# Patient Record
Sex: Male | Born: 1940 | Race: White | Hispanic: No | Marital: Married | Smoking: Former smoker
Health system: Southern US, Community
[De-identification: ages and names within clinical notes are randomized; demographics above are authoritative.]

## PROBLEM LIST (undated history)

## (undated) DIAGNOSIS — F419 Anxiety disorder, unspecified: Secondary | ICD-10-CM

## (undated) DIAGNOSIS — H9190 Unspecified hearing loss, unspecified ear: Secondary | ICD-10-CM

## (undated) DIAGNOSIS — R413 Other amnesia: Secondary | ICD-10-CM

## (undated) DIAGNOSIS — R0600 Dyspnea, unspecified: Secondary | ICD-10-CM

## (undated) DIAGNOSIS — N183 Chronic kidney disease, stage 3 unspecified: Secondary | ICD-10-CM

## (undated) DIAGNOSIS — D509 Iron deficiency anemia, unspecified: Secondary | ICD-10-CM

## (undated) DIAGNOSIS — C801 Malignant (primary) neoplasm, unspecified: Secondary | ICD-10-CM

## (undated) DIAGNOSIS — N2581 Secondary hyperparathyroidism of renal origin: Secondary | ICD-10-CM

## (undated) DIAGNOSIS — I639 Cerebral infarction, unspecified: Secondary | ICD-10-CM

## (undated) DIAGNOSIS — I5189 Other ill-defined heart diseases: Secondary | ICD-10-CM

## (undated) DIAGNOSIS — N184 Chronic kidney disease, stage 4 (severe): Secondary | ICD-10-CM

## (undated) DIAGNOSIS — Z950 Presence of cardiac pacemaker: Secondary | ICD-10-CM

## (undated) DIAGNOSIS — E785 Hyperlipidemia, unspecified: Secondary | ICD-10-CM

## (undated) DIAGNOSIS — I499 Cardiac arrhythmia, unspecified: Secondary | ICD-10-CM

## (undated) DIAGNOSIS — I421 Obstructive hypertrophic cardiomyopathy: Secondary | ICD-10-CM

## (undated) DIAGNOSIS — R55 Syncope and collapse: Secondary | ICD-10-CM

## (undated) DIAGNOSIS — Z9989 Dependence on other enabling machines and devices: Secondary | ICD-10-CM

## (undated) DIAGNOSIS — L97519 Non-pressure chronic ulcer of other part of right foot with unspecified severity: Secondary | ICD-10-CM

## (undated) DIAGNOSIS — I739 Peripheral vascular disease, unspecified: Secondary | ICD-10-CM

## (undated) DIAGNOSIS — G4730 Sleep apnea, unspecified: Secondary | ICD-10-CM

## (undated) DIAGNOSIS — I2510 Atherosclerotic heart disease of native coronary artery without angina pectoris: Secondary | ICD-10-CM

## (undated) DIAGNOSIS — I495 Sick sinus syndrome: Secondary | ICD-10-CM

## (undated) DIAGNOSIS — M1990 Unspecified osteoarthritis, unspecified site: Secondary | ICD-10-CM

## (undated) DIAGNOSIS — Z85828 Personal history of other malignant neoplasm of skin: Secondary | ICD-10-CM

## (undated) DIAGNOSIS — E1159 Type 2 diabetes mellitus with other circulatory complications: Secondary | ICD-10-CM

## (undated) DIAGNOSIS — Z794 Long term (current) use of insulin: Secondary | ICD-10-CM

## (undated) DIAGNOSIS — K8000 Calculus of gallbladder with acute cholecystitis without obstruction: Secondary | ICD-10-CM

## (undated) DIAGNOSIS — I4891 Unspecified atrial fibrillation: Secondary | ICD-10-CM

## (undated) DIAGNOSIS — S2239XA Fracture of one rib, unspecified side, initial encounter for closed fracture: Secondary | ICD-10-CM

## (undated) DIAGNOSIS — I10 Essential (primary) hypertension: Secondary | ICD-10-CM

## (undated) DIAGNOSIS — Z9641 Presence of insulin pump (external) (internal): Secondary | ICD-10-CM

## (undated) DIAGNOSIS — Z872 Personal history of diseases of the skin and subcutaneous tissue: Secondary | ICD-10-CM

## (undated) DIAGNOSIS — J3089 Other allergic rhinitis: Secondary | ICD-10-CM

## (undated) DIAGNOSIS — G629 Polyneuropathy, unspecified: Secondary | ICD-10-CM

## (undated) DIAGNOSIS — F32A Depression, unspecified: Secondary | ICD-10-CM

## (undated) DIAGNOSIS — N400 Enlarged prostate without lower urinary tract symptoms: Secondary | ICD-10-CM

## (undated) DIAGNOSIS — E119 Type 2 diabetes mellitus without complications: Secondary | ICD-10-CM

## (undated) DIAGNOSIS — L821 Other seborrheic keratosis: Secondary | ICD-10-CM

## (undated) DIAGNOSIS — E1169 Type 2 diabetes mellitus with other specified complication: Secondary | ICD-10-CM

## (undated) DIAGNOSIS — J302 Other seasonal allergic rhinitis: Secondary | ICD-10-CM

## (undated) DIAGNOSIS — I509 Heart failure, unspecified: Secondary | ICD-10-CM

## (undated) DIAGNOSIS — G4733 Obstructive sleep apnea (adult) (pediatric): Secondary | ICD-10-CM

## (undated) HISTORY — DX: Obstructive hypertrophic cardiomyopathy: I42.1

## (undated) HISTORY — DX: Other allergic rhinitis: J30.89

## (undated) HISTORY — DX: Dyspnea, unspecified: R06.00

## (undated) HISTORY — PX: KNEE ARTHROSCOPY: SHX127

## (undated) HISTORY — DX: Other seasonal allergic rhinitis: J30.2

## (undated) HISTORY — PX: EYE SURGERY: SHX253

## (undated) HISTORY — DX: Essential (primary) hypertension: I10

## (undated) HISTORY — DX: Cardiac arrhythmia, unspecified: I49.9

## (undated) HISTORY — DX: Syncope and collapse: R55

## (undated) HISTORY — PX: CARDIAC CATHETERIZATION: SHX172

## (undated) HISTORY — DX: Chronic kidney disease, stage 4 (severe): N18.4

## (undated) HISTORY — DX: Atherosclerotic heart disease of native coronary artery without angina pectoris: I25.10

## (undated) HISTORY — DX: Type 2 diabetes mellitus with other specified complication: E11.69

## (undated) HISTORY — DX: Other ill-defined heart diseases: I51.89

## (undated) HISTORY — PX: TOTAL KNEE ARTHROPLASTY: SHX125

## (undated) HISTORY — DX: Hyperlipidemia, unspecified: E78.5

## (undated) HISTORY — DX: Sleep apnea, unspecified: G47.30

## (undated) HISTORY — DX: Peripheral vascular disease, unspecified: I73.9

## (undated) HISTORY — DX: Type 2 diabetes mellitus without complications: E11.9

## (undated) HISTORY — DX: Anxiety disorder, unspecified: F41.9

## (undated) HISTORY — DX: Type 2 diabetes mellitus with other circulatory complications: E11.59

## (undated) HISTORY — DX: Calculus of gallbladder with acute cholecystitis without obstruction: K80.00

## (undated) HISTORY — PX: VEIN LIGATION AND STRIPPING: SHX2653

## (undated) HISTORY — DX: Long term (current) use of insulin: Z79.4

## (undated) HISTORY — DX: Presence of cardiac pacemaker: Z95.0

## (undated) HISTORY — DX: Unspecified osteoarthritis, unspecified site: M19.90

## (undated) HISTORY — DX: Malignant (primary) neoplasm, unspecified: C80.1

## (undated) HISTORY — DX: Other seborrheic keratosis: L82.1

## (undated) HISTORY — DX: Cerebral infarction, unspecified: I63.9

## (undated) HISTORY — DX: Unspecified atrial fibrillation: I48.91

---

## 1989-05-16 DIAGNOSIS — Z794 Long term (current) use of insulin: Secondary | ICD-10-CM

## 1989-05-16 DIAGNOSIS — E119 Type 2 diabetes mellitus without complications: Secondary | ICD-10-CM

## 1989-05-16 HISTORY — DX: Type 2 diabetes mellitus without complications: E11.9

## 1989-05-16 HISTORY — DX: Type 2 diabetes mellitus without complications: Z79.4

## 2008-03-16 HISTORY — PX: CARDIAC PACEMAKER PLACEMENT: SHX583

## 2010-07-15 HISTORY — PX: AMPUTATION OF REPLICATED TOES: SHX1136

## 2011-05-25 DIAGNOSIS — L97509 Non-pressure chronic ulcer of other part of unspecified foot with unspecified severity: Secondary | ICD-10-CM | POA: Diagnosis not present

## 2011-05-25 DIAGNOSIS — M2040 Other hammer toe(s) (acquired), unspecified foot: Secondary | ICD-10-CM | POA: Diagnosis not present

## 2011-05-25 DIAGNOSIS — E1149 Type 2 diabetes mellitus with other diabetic neurological complication: Secondary | ICD-10-CM | POA: Diagnosis not present

## 2011-05-26 DIAGNOSIS — J019 Acute sinusitis, unspecified: Secondary | ICD-10-CM | POA: Diagnosis not present

## 2011-05-31 DIAGNOSIS — M19079 Primary osteoarthritis, unspecified ankle and foot: Secondary | ICD-10-CM | POA: Diagnosis not present

## 2011-05-31 DIAGNOSIS — M25579 Pain in unspecified ankle and joints of unspecified foot: Secondary | ICD-10-CM | POA: Diagnosis not present

## 2011-06-06 DIAGNOSIS — I495 Sick sinus syndrome: Secondary | ICD-10-CM | POA: Diagnosis not present

## 2011-06-07 DIAGNOSIS — I10 Essential (primary) hypertension: Secondary | ICD-10-CM | POA: Diagnosis not present

## 2011-06-07 DIAGNOSIS — E119 Type 2 diabetes mellitus without complications: Secondary | ICD-10-CM | POA: Diagnosis not present

## 2011-06-07 DIAGNOSIS — Z01818 Encounter for other preprocedural examination: Secondary | ICD-10-CM | POA: Diagnosis not present

## 2011-06-10 DIAGNOSIS — M869 Osteomyelitis, unspecified: Secondary | ICD-10-CM | POA: Diagnosis not present

## 2011-06-10 DIAGNOSIS — M86649 Other chronic osteomyelitis, unspecified hand: Secondary | ICD-10-CM | POA: Diagnosis not present

## 2011-06-20 DIAGNOSIS — E119 Type 2 diabetes mellitus without complications: Secondary | ICD-10-CM | POA: Diagnosis not present

## 2011-06-20 DIAGNOSIS — S98139A Complete traumatic amputation of one unspecified lesser toe, initial encounter: Secondary | ICD-10-CM | POA: Diagnosis not present

## 2011-07-28 DIAGNOSIS — B351 Tinea unguium: Secondary | ICD-10-CM | POA: Diagnosis not present

## 2011-07-28 DIAGNOSIS — M869 Osteomyelitis, unspecified: Secondary | ICD-10-CM | POA: Diagnosis not present

## 2011-08-08 DIAGNOSIS — I129 Hypertensive chronic kidney disease with stage 1 through stage 4 chronic kidney disease, or unspecified chronic kidney disease: Secondary | ICD-10-CM | POA: Diagnosis not present

## 2011-08-08 DIAGNOSIS — N189 Chronic kidney disease, unspecified: Secondary | ICD-10-CM | POA: Diagnosis not present

## 2011-08-11 DIAGNOSIS — R609 Edema, unspecified: Secondary | ICD-10-CM | POA: Diagnosis not present

## 2011-08-11 DIAGNOSIS — N183 Chronic kidney disease, stage 3 unspecified: Secondary | ICD-10-CM | POA: Diagnosis not present

## 2011-08-11 DIAGNOSIS — N2589 Other disorders resulting from impaired renal tubular function: Secondary | ICD-10-CM | POA: Diagnosis not present

## 2011-08-11 DIAGNOSIS — I10 Essential (primary) hypertension: Secondary | ICD-10-CM | POA: Diagnosis not present

## 2011-08-11 DIAGNOSIS — E875 Hyperkalemia: Secondary | ICD-10-CM | POA: Diagnosis not present

## 2011-09-19 DIAGNOSIS — I10 Essential (primary) hypertension: Secondary | ICD-10-CM | POA: Diagnosis not present

## 2011-09-19 DIAGNOSIS — R5381 Other malaise: Secondary | ICD-10-CM | POA: Diagnosis not present

## 2011-09-19 DIAGNOSIS — E559 Vitamin D deficiency, unspecified: Secondary | ICD-10-CM | POA: Diagnosis not present

## 2011-09-19 DIAGNOSIS — E119 Type 2 diabetes mellitus without complications: Secondary | ICD-10-CM | POA: Diagnosis not present

## 2011-09-19 DIAGNOSIS — N289 Disorder of kidney and ureter, unspecified: Secondary | ICD-10-CM | POA: Diagnosis not present

## 2011-09-22 DIAGNOSIS — I495 Sick sinus syndrome: Secondary | ICD-10-CM | POA: Diagnosis not present

## 2011-09-22 DIAGNOSIS — N189 Chronic kidney disease, unspecified: Secondary | ICD-10-CM | POA: Diagnosis not present

## 2011-09-22 DIAGNOSIS — I10 Essential (primary) hypertension: Secondary | ICD-10-CM | POA: Diagnosis not present

## 2011-09-22 DIAGNOSIS — I2510 Atherosclerotic heart disease of native coronary artery without angina pectoris: Secondary | ICD-10-CM | POA: Diagnosis not present

## 2011-09-22 DIAGNOSIS — E109 Type 1 diabetes mellitus without complications: Secondary | ICD-10-CM | POA: Diagnosis not present

## 2011-09-30 DIAGNOSIS — I495 Sick sinus syndrome: Secondary | ICD-10-CM | POA: Diagnosis not present

## 2011-10-06 DIAGNOSIS — E1149 Type 2 diabetes mellitus with other diabetic neurological complication: Secondary | ICD-10-CM | POA: Diagnosis not present

## 2011-10-21 DIAGNOSIS — H40029 Open angle with borderline findings, high risk, unspecified eye: Secondary | ICD-10-CM | POA: Diagnosis not present

## 2011-10-21 DIAGNOSIS — E11319 Type 2 diabetes mellitus with unspecified diabetic retinopathy without macular edema: Secondary | ICD-10-CM | POA: Diagnosis not present

## 2011-10-21 DIAGNOSIS — E1139 Type 2 diabetes mellitus with other diabetic ophthalmic complication: Secondary | ICD-10-CM | POA: Diagnosis not present

## 2011-10-21 DIAGNOSIS — H04129 Dry eye syndrome of unspecified lacrimal gland: Secondary | ICD-10-CM | POA: Diagnosis not present

## 2011-10-24 DIAGNOSIS — H6120 Impacted cerumen, unspecified ear: Secondary | ICD-10-CM | POA: Diagnosis not present

## 2011-10-27 DIAGNOSIS — L97509 Non-pressure chronic ulcer of other part of unspecified foot with unspecified severity: Secondary | ICD-10-CM | POA: Diagnosis not present

## 2011-10-28 DIAGNOSIS — L97509 Non-pressure chronic ulcer of other part of unspecified foot with unspecified severity: Secondary | ICD-10-CM | POA: Diagnosis not present

## 2011-10-28 DIAGNOSIS — E1149 Type 2 diabetes mellitus with other diabetic neurological complication: Secondary | ICD-10-CM | POA: Diagnosis not present

## 2011-10-28 DIAGNOSIS — M2040 Other hammer toe(s) (acquired), unspecified foot: Secondary | ICD-10-CM | POA: Diagnosis not present

## 2011-11-04 DIAGNOSIS — L97509 Non-pressure chronic ulcer of other part of unspecified foot with unspecified severity: Secondary | ICD-10-CM | POA: Diagnosis not present

## 2011-11-04 DIAGNOSIS — M2040 Other hammer toe(s) (acquired), unspecified foot: Secondary | ICD-10-CM | POA: Diagnosis not present

## 2011-11-10 DIAGNOSIS — L97509 Non-pressure chronic ulcer of other part of unspecified foot with unspecified severity: Secondary | ICD-10-CM | POA: Diagnosis not present

## 2011-12-02 DIAGNOSIS — M2040 Other hammer toe(s) (acquired), unspecified foot: Secondary | ICD-10-CM | POA: Diagnosis not present

## 2011-12-02 DIAGNOSIS — E1149 Type 2 diabetes mellitus with other diabetic neurological complication: Secondary | ICD-10-CM | POA: Diagnosis not present

## 2011-12-02 DIAGNOSIS — L97509 Non-pressure chronic ulcer of other part of unspecified foot with unspecified severity: Secondary | ICD-10-CM | POA: Diagnosis not present

## 2011-12-02 DIAGNOSIS — B351 Tinea unguium: Secondary | ICD-10-CM | POA: Diagnosis not present

## 2011-12-20 DIAGNOSIS — M129 Arthropathy, unspecified: Secondary | ICD-10-CM | POA: Diagnosis not present

## 2011-12-20 DIAGNOSIS — N19 Unspecified kidney failure: Secondary | ICD-10-CM | POA: Diagnosis not present

## 2011-12-20 DIAGNOSIS — Z950 Presence of cardiac pacemaker: Secondary | ICD-10-CM | POA: Diagnosis not present

## 2011-12-20 DIAGNOSIS — I700 Atherosclerosis of aorta: Secondary | ICD-10-CM | POA: Diagnosis not present

## 2011-12-20 DIAGNOSIS — N189 Chronic kidney disease, unspecified: Secondary | ICD-10-CM | POA: Diagnosis not present

## 2011-12-20 DIAGNOSIS — I517 Cardiomegaly: Secondary | ICD-10-CM | POA: Diagnosis not present

## 2011-12-20 DIAGNOSIS — I129 Hypertensive chronic kidney disease with stage 1 through stage 4 chronic kidney disease, or unspecified chronic kidney disease: Secondary | ICD-10-CM | POA: Diagnosis not present

## 2011-12-20 DIAGNOSIS — J40 Bronchitis, not specified as acute or chronic: Secondary | ICD-10-CM | POA: Diagnosis not present

## 2011-12-20 DIAGNOSIS — I2510 Atherosclerotic heart disease of native coronary artery without angina pectoris: Secondary | ICD-10-CM | POA: Diagnosis not present

## 2011-12-20 DIAGNOSIS — E1129 Type 2 diabetes mellitus with other diabetic kidney complication: Secondary | ICD-10-CM | POA: Diagnosis not present

## 2011-12-22 DIAGNOSIS — D72829 Elevated white blood cell count, unspecified: Secondary | ICD-10-CM | POA: Diagnosis not present

## 2011-12-22 DIAGNOSIS — R509 Fever, unspecified: Secondary | ICD-10-CM | POA: Diagnosis not present

## 2011-12-22 DIAGNOSIS — E1169 Type 2 diabetes mellitus with other specified complication: Secondary | ICD-10-CM | POA: Diagnosis not present

## 2011-12-22 DIAGNOSIS — E119 Type 2 diabetes mellitus without complications: Secondary | ICD-10-CM | POA: Diagnosis not present

## 2011-12-22 DIAGNOSIS — N289 Disorder of kidney and ureter, unspecified: Secondary | ICD-10-CM | POA: Diagnosis not present

## 2011-12-22 DIAGNOSIS — R52 Pain, unspecified: Secondary | ICD-10-CM | POA: Diagnosis not present

## 2011-12-24 DIAGNOSIS — J189 Pneumonia, unspecified organism: Secondary | ICD-10-CM | POA: Diagnosis not present

## 2011-12-24 DIAGNOSIS — S98139A Complete traumatic amputation of one unspecified lesser toe, initial encounter: Secondary | ICD-10-CM | POA: Diagnosis not present

## 2011-12-24 DIAGNOSIS — R5381 Other malaise: Secondary | ICD-10-CM | POA: Diagnosis present

## 2011-12-24 DIAGNOSIS — R05 Cough: Secondary | ICD-10-CM | POA: Diagnosis not present

## 2011-12-24 DIAGNOSIS — M7989 Other specified soft tissue disorders: Secondary | ICD-10-CM | POA: Diagnosis not present

## 2011-12-24 DIAGNOSIS — L03039 Cellulitis of unspecified toe: Secondary | ICD-10-CM | POA: Diagnosis present

## 2011-12-24 DIAGNOSIS — E119 Type 2 diabetes mellitus without complications: Secondary | ICD-10-CM | POA: Diagnosis not present

## 2011-12-24 DIAGNOSIS — N189 Chronic kidney disease, unspecified: Secondary | ICD-10-CM | POA: Diagnosis not present

## 2011-12-24 DIAGNOSIS — M1990 Unspecified osteoarthritis, unspecified site: Secondary | ICD-10-CM | POA: Diagnosis not present

## 2011-12-24 DIAGNOSIS — I700 Atherosclerosis of aorta: Secondary | ICD-10-CM | POA: Diagnosis not present

## 2011-12-24 DIAGNOSIS — I509 Heart failure, unspecified: Secondary | ICD-10-CM | POA: Diagnosis not present

## 2011-12-24 DIAGNOSIS — I517 Cardiomegaly: Secondary | ICD-10-CM | POA: Diagnosis not present

## 2011-12-24 DIAGNOSIS — R059 Cough, unspecified: Secondary | ICD-10-CM | POA: Diagnosis not present

## 2011-12-24 DIAGNOSIS — R509 Fever, unspecified: Secondary | ICD-10-CM | POA: Diagnosis present

## 2011-12-24 DIAGNOSIS — L02619 Cutaneous abscess of unspecified foot: Secondary | ICD-10-CM | POA: Diagnosis not present

## 2011-12-24 DIAGNOSIS — Z794 Long term (current) use of insulin: Secondary | ICD-10-CM | POA: Diagnosis not present

## 2011-12-24 DIAGNOSIS — M949 Disorder of cartilage, unspecified: Secondary | ICD-10-CM | POA: Diagnosis not present

## 2011-12-24 DIAGNOSIS — I739 Peripheral vascular disease, unspecified: Secondary | ICD-10-CM | POA: Diagnosis not present

## 2011-12-24 DIAGNOSIS — R5383 Other fatigue: Secondary | ICD-10-CM | POA: Diagnosis not present

## 2011-12-24 DIAGNOSIS — J449 Chronic obstructive pulmonary disease, unspecified: Secondary | ICD-10-CM | POA: Diagnosis not present

## 2011-12-24 DIAGNOSIS — M899 Disorder of bone, unspecified: Secondary | ICD-10-CM | POA: Diagnosis not present

## 2011-12-28 DIAGNOSIS — J189 Pneumonia, unspecified organism: Secondary | ICD-10-CM | POA: Diagnosis not present

## 2011-12-29 DIAGNOSIS — J189 Pneumonia, unspecified organism: Secondary | ICD-10-CM | POA: Diagnosis not present

## 2011-12-30 DIAGNOSIS — J189 Pneumonia, unspecified organism: Secondary | ICD-10-CM | POA: Diagnosis not present

## 2011-12-31 DIAGNOSIS — R6889 Other general symptoms and signs: Secondary | ICD-10-CM | POA: Diagnosis not present

## 2011-12-31 DIAGNOSIS — I2510 Atherosclerotic heart disease of native coronary artery without angina pectoris: Secondary | ICD-10-CM | POA: Diagnosis not present

## 2011-12-31 DIAGNOSIS — L97509 Non-pressure chronic ulcer of other part of unspecified foot with unspecified severity: Secondary | ICD-10-CM | POA: Diagnosis not present

## 2011-12-31 DIAGNOSIS — K2940 Chronic atrophic gastritis without bleeding: Secondary | ICD-10-CM | POA: Diagnosis present

## 2011-12-31 DIAGNOSIS — R509 Fever, unspecified: Secondary | ICD-10-CM | POA: Diagnosis not present

## 2011-12-31 DIAGNOSIS — M109 Gout, unspecified: Secondary | ICD-10-CM | POA: Diagnosis not present

## 2011-12-31 DIAGNOSIS — N320 Bladder-neck obstruction: Secondary | ICD-10-CM | POA: Diagnosis not present

## 2011-12-31 DIAGNOSIS — R319 Hematuria, unspecified: Secondary | ICD-10-CM | POA: Diagnosis not present

## 2011-12-31 DIAGNOSIS — I4589 Other specified conduction disorders: Secondary | ICD-10-CM | POA: Diagnosis not present

## 2011-12-31 DIAGNOSIS — K2960 Other gastritis without bleeding: Secondary | ICD-10-CM | POA: Diagnosis not present

## 2011-12-31 DIAGNOSIS — K5730 Diverticulosis of large intestine without perforation or abscess without bleeding: Secondary | ICD-10-CM | POA: Diagnosis not present

## 2011-12-31 DIAGNOSIS — J9819 Other pulmonary collapse: Secondary | ICD-10-CM | POA: Diagnosis not present

## 2011-12-31 DIAGNOSIS — D6489 Other specified anemias: Secondary | ICD-10-CM | POA: Diagnosis present

## 2011-12-31 DIAGNOSIS — D72829 Elevated white blood cell count, unspecified: Secondary | ICD-10-CM | POA: Diagnosis not present

## 2011-12-31 DIAGNOSIS — E1169 Type 2 diabetes mellitus with other specified complication: Secondary | ICD-10-CM | POA: Diagnosis not present

## 2011-12-31 DIAGNOSIS — R109 Unspecified abdominal pain: Secondary | ICD-10-CM | POA: Diagnosis not present

## 2011-12-31 DIAGNOSIS — N400 Enlarged prostate without lower urinary tract symptoms: Secondary | ICD-10-CM | POA: Diagnosis not present

## 2011-12-31 DIAGNOSIS — K5900 Constipation, unspecified: Secondary | ICD-10-CM | POA: Diagnosis present

## 2011-12-31 DIAGNOSIS — N3289 Other specified disorders of bladder: Secondary | ICD-10-CM | POA: Diagnosis not present

## 2011-12-31 DIAGNOSIS — Z794 Long term (current) use of insulin: Secondary | ICD-10-CM | POA: Diagnosis present

## 2011-12-31 DIAGNOSIS — Z1211 Encounter for screening for malignant neoplasm of colon: Secondary | ICD-10-CM | POA: Diagnosis not present

## 2011-12-31 DIAGNOSIS — N289 Disorder of kidney and ureter, unspecified: Secondary | ICD-10-CM | POA: Diagnosis not present

## 2011-12-31 DIAGNOSIS — Z86010 Personal history of colonic polyps: Secondary | ICD-10-CM | POA: Diagnosis not present

## 2011-12-31 DIAGNOSIS — Z950 Presence of cardiac pacemaker: Secondary | ICD-10-CM | POA: Diagnosis not present

## 2011-12-31 DIAGNOSIS — K209 Esophagitis, unspecified without bleeding: Secondary | ICD-10-CM | POA: Diagnosis not present

## 2011-12-31 DIAGNOSIS — K2980 Duodenitis without bleeding: Secondary | ICD-10-CM | POA: Diagnosis not present

## 2011-12-31 DIAGNOSIS — G4733 Obstructive sleep apnea (adult) (pediatric): Secondary | ICD-10-CM | POA: Diagnosis present

## 2011-12-31 DIAGNOSIS — E1149 Type 2 diabetes mellitus with other diabetic neurological complication: Secondary | ICD-10-CM | POA: Diagnosis not present

## 2011-12-31 DIAGNOSIS — K5289 Other specified noninfective gastroenteritis and colitis: Secondary | ICD-10-CM | POA: Diagnosis not present

## 2011-12-31 DIAGNOSIS — N189 Chronic kidney disease, unspecified: Secondary | ICD-10-CM | POA: Diagnosis not present

## 2011-12-31 DIAGNOSIS — M5137 Other intervertebral disc degeneration, lumbosacral region: Secondary | ICD-10-CM | POA: Diagnosis not present

## 2011-12-31 DIAGNOSIS — N401 Enlarged prostate with lower urinary tract symptoms: Secondary | ICD-10-CM | POA: Diagnosis not present

## 2011-12-31 DIAGNOSIS — D649 Anemia, unspecified: Secondary | ICD-10-CM | POA: Diagnosis not present

## 2011-12-31 DIAGNOSIS — M790 Rheumatism, unspecified: Secondary | ICD-10-CM | POA: Diagnosis not present

## 2011-12-31 DIAGNOSIS — I129 Hypertensive chronic kidney disease with stage 1 through stage 4 chronic kidney disease, or unspecified chronic kidney disease: Secondary | ICD-10-CM | POA: Diagnosis not present

## 2011-12-31 DIAGNOSIS — K2990 Gastroduodenitis, unspecified, without bleeding: Secondary | ICD-10-CM | POA: Diagnosis not present

## 2011-12-31 DIAGNOSIS — Z96659 Presence of unspecified artificial knee joint: Secondary | ICD-10-CM | POA: Diagnosis not present

## 2011-12-31 DIAGNOSIS — R3989 Other symptoms and signs involving the genitourinary system: Secondary | ICD-10-CM | POA: Diagnosis not present

## 2011-12-31 DIAGNOSIS — E119 Type 2 diabetes mellitus without complications: Secondary | ICD-10-CM | POA: Diagnosis not present

## 2011-12-31 DIAGNOSIS — R7881 Bacteremia: Secondary | ICD-10-CM | POA: Diagnosis not present

## 2011-12-31 DIAGNOSIS — N138 Other obstructive and reflux uropathy: Secondary | ICD-10-CM | POA: Diagnosis present

## 2011-12-31 DIAGNOSIS — I517 Cardiomegaly: Secondary | ICD-10-CM | POA: Diagnosis not present

## 2011-12-31 DIAGNOSIS — N058 Unspecified nephritic syndrome with other morphologic changes: Secondary | ICD-10-CM | POA: Diagnosis present

## 2011-12-31 DIAGNOSIS — B952 Enterococcus as the cause of diseases classified elsewhere: Secondary | ICD-10-CM | POA: Diagnosis present

## 2011-12-31 DIAGNOSIS — E781 Pure hyperglyceridemia: Secondary | ICD-10-CM | POA: Diagnosis present

## 2011-12-31 DIAGNOSIS — E1142 Type 2 diabetes mellitus with diabetic polyneuropathy: Secondary | ICD-10-CM | POA: Diagnosis present

## 2011-12-31 DIAGNOSIS — G4730 Sleep apnea, unspecified: Secondary | ICD-10-CM | POA: Diagnosis not present

## 2011-12-31 DIAGNOSIS — E1129 Type 2 diabetes mellitus with other diabetic kidney complication: Secondary | ICD-10-CM | POA: Diagnosis present

## 2011-12-31 DIAGNOSIS — I700 Atherosclerosis of aorta: Secondary | ICD-10-CM | POA: Diagnosis not present

## 2011-12-31 DIAGNOSIS — K2970 Gastritis, unspecified, without bleeding: Secondary | ICD-10-CM | POA: Diagnosis not present

## 2011-12-31 DIAGNOSIS — Z792 Long term (current) use of antibiotics: Secondary | ICD-10-CM | POA: Diagnosis present

## 2012-01-12 DIAGNOSIS — Z792 Long term (current) use of antibiotics: Secondary | ICD-10-CM | POA: Diagnosis present

## 2012-01-12 DIAGNOSIS — D72829 Elevated white blood cell count, unspecified: Secondary | ICD-10-CM | POA: Diagnosis not present

## 2012-01-12 DIAGNOSIS — G4730 Sleep apnea, unspecified: Secondary | ICD-10-CM | POA: Diagnosis not present

## 2012-01-12 DIAGNOSIS — Z950 Presence of cardiac pacemaker: Secondary | ICD-10-CM | POA: Diagnosis not present

## 2012-01-12 DIAGNOSIS — R609 Edema, unspecified: Secondary | ICD-10-CM | POA: Diagnosis not present

## 2012-01-12 DIAGNOSIS — R7881 Bacteremia: Secondary | ICD-10-CM | POA: Diagnosis not present

## 2012-01-12 DIAGNOSIS — R509 Fever, unspecified: Secondary | ICD-10-CM | POA: Diagnosis present

## 2012-01-12 DIAGNOSIS — E875 Hyperkalemia: Secondary | ICD-10-CM | POA: Diagnosis not present

## 2012-01-12 DIAGNOSIS — Z794 Long term (current) use of insulin: Secondary | ICD-10-CM | POA: Diagnosis present

## 2012-01-12 DIAGNOSIS — N320 Bladder-neck obstruction: Secondary | ICD-10-CM | POA: Diagnosis not present

## 2012-01-12 DIAGNOSIS — E1149 Type 2 diabetes mellitus with other diabetic neurological complication: Secondary | ICD-10-CM | POA: Diagnosis not present

## 2012-01-12 DIAGNOSIS — I129 Hypertensive chronic kidney disease with stage 1 through stage 4 chronic kidney disease, or unspecified chronic kidney disease: Secondary | ICD-10-CM | POA: Diagnosis present

## 2012-01-12 DIAGNOSIS — N183 Chronic kidney disease, stage 3 unspecified: Secondary | ICD-10-CM | POA: Diagnosis not present

## 2012-01-12 DIAGNOSIS — N189 Chronic kidney disease, unspecified: Secondary | ICD-10-CM | POA: Diagnosis not present

## 2012-01-12 DIAGNOSIS — N3289 Other specified disorders of bladder: Secondary | ICD-10-CM | POA: Diagnosis not present

## 2012-01-12 DIAGNOSIS — M2040 Other hammer toe(s) (acquired), unspecified foot: Secondary | ICD-10-CM | POA: Diagnosis not present

## 2012-01-12 DIAGNOSIS — E1129 Type 2 diabetes mellitus with other diabetic kidney complication: Secondary | ICD-10-CM | POA: Diagnosis present

## 2012-01-12 DIAGNOSIS — I2510 Atherosclerotic heart disease of native coronary artery without angina pectoris: Secondary | ICD-10-CM | POA: Diagnosis not present

## 2012-01-12 DIAGNOSIS — N289 Disorder of kidney and ureter, unspecified: Secondary | ICD-10-CM | POA: Diagnosis not present

## 2012-01-12 DIAGNOSIS — N2589 Other disorders resulting from impaired renal tubular function: Secondary | ICD-10-CM | POA: Diagnosis not present

## 2012-01-12 DIAGNOSIS — B952 Enterococcus as the cause of diseases classified elsewhere: Secondary | ICD-10-CM | POA: Diagnosis not present

## 2012-01-12 DIAGNOSIS — I10 Essential (primary) hypertension: Secondary | ICD-10-CM | POA: Diagnosis not present

## 2012-01-12 DIAGNOSIS — L97509 Non-pressure chronic ulcer of other part of unspecified foot with unspecified severity: Secondary | ICD-10-CM | POA: Diagnosis not present

## 2012-01-18 DIAGNOSIS — R7881 Bacteremia: Secondary | ICD-10-CM | POA: Diagnosis not present

## 2012-01-18 DIAGNOSIS — B952 Enterococcus as the cause of diseases classified elsewhere: Secondary | ICD-10-CM | POA: Diagnosis not present

## 2012-01-18 DIAGNOSIS — Z792 Long term (current) use of antibiotics: Secondary | ICD-10-CM | POA: Diagnosis not present

## 2012-01-18 DIAGNOSIS — R509 Fever, unspecified: Secondary | ICD-10-CM | POA: Diagnosis not present

## 2012-01-18 DIAGNOSIS — D72829 Elevated white blood cell count, unspecified: Secondary | ICD-10-CM | POA: Diagnosis not present

## 2012-01-18 DIAGNOSIS — E1129 Type 2 diabetes mellitus with other diabetic kidney complication: Secondary | ICD-10-CM | POA: Diagnosis not present

## 2012-01-26 DIAGNOSIS — Z792 Long term (current) use of antibiotics: Secondary | ICD-10-CM | POA: Diagnosis not present

## 2012-01-26 DIAGNOSIS — B952 Enterococcus as the cause of diseases classified elsewhere: Secondary | ICD-10-CM | POA: Diagnosis not present

## 2012-01-26 DIAGNOSIS — R509 Fever, unspecified: Secondary | ICD-10-CM | POA: Diagnosis not present

## 2012-01-26 DIAGNOSIS — D72829 Elevated white blood cell count, unspecified: Secondary | ICD-10-CM | POA: Diagnosis not present

## 2012-01-26 DIAGNOSIS — R7881 Bacteremia: Secondary | ICD-10-CM | POA: Diagnosis not present

## 2012-01-26 DIAGNOSIS — E1129 Type 2 diabetes mellitus with other diabetic kidney complication: Secondary | ICD-10-CM | POA: Diagnosis not present

## 2012-01-31 DIAGNOSIS — L97509 Non-pressure chronic ulcer of other part of unspecified foot with unspecified severity: Secondary | ICD-10-CM | POA: Diagnosis not present

## 2012-01-31 DIAGNOSIS — M2040 Other hammer toe(s) (acquired), unspecified foot: Secondary | ICD-10-CM | POA: Diagnosis not present

## 2012-01-31 DIAGNOSIS — E1149 Type 2 diabetes mellitus with other diabetic neurological complication: Secondary | ICD-10-CM | POA: Diagnosis not present

## 2012-02-02 DIAGNOSIS — E1129 Type 2 diabetes mellitus with other diabetic kidney complication: Secondary | ICD-10-CM | POA: Diagnosis not present

## 2012-02-02 DIAGNOSIS — N183 Chronic kidney disease, stage 3 unspecified: Secondary | ICD-10-CM | POA: Diagnosis not present

## 2012-02-02 DIAGNOSIS — R7881 Bacteremia: Secondary | ICD-10-CM | POA: Diagnosis not present

## 2012-02-02 DIAGNOSIS — R609 Edema, unspecified: Secondary | ICD-10-CM | POA: Diagnosis not present

## 2012-02-02 DIAGNOSIS — R509 Fever, unspecified: Secondary | ICD-10-CM | POA: Diagnosis not present

## 2012-02-02 DIAGNOSIS — E875 Hyperkalemia: Secondary | ICD-10-CM | POA: Diagnosis not present

## 2012-02-02 DIAGNOSIS — I10 Essential (primary) hypertension: Secondary | ICD-10-CM | POA: Diagnosis not present

## 2012-02-02 DIAGNOSIS — N2589 Other disorders resulting from impaired renal tubular function: Secondary | ICD-10-CM | POA: Diagnosis not present

## 2012-02-02 DIAGNOSIS — D72829 Elevated white blood cell count, unspecified: Secondary | ICD-10-CM | POA: Diagnosis not present

## 2012-02-02 DIAGNOSIS — B952 Enterococcus as the cause of diseases classified elsewhere: Secondary | ICD-10-CM | POA: Diagnosis not present

## 2012-02-02 DIAGNOSIS — Z792 Long term (current) use of antibiotics: Secondary | ICD-10-CM | POA: Diagnosis not present

## 2012-02-15 DIAGNOSIS — R7881 Bacteremia: Secondary | ICD-10-CM | POA: Diagnosis not present

## 2012-02-15 DIAGNOSIS — Z792 Long term (current) use of antibiotics: Secondary | ICD-10-CM | POA: Diagnosis not present

## 2012-02-15 DIAGNOSIS — E1129 Type 2 diabetes mellitus with other diabetic kidney complication: Secondary | ICD-10-CM | POA: Diagnosis not present

## 2012-02-15 DIAGNOSIS — B952 Enterococcus as the cause of diseases classified elsewhere: Secondary | ICD-10-CM | POA: Diagnosis not present

## 2012-02-15 DIAGNOSIS — D72829 Elevated white blood cell count, unspecified: Secondary | ICD-10-CM | POA: Diagnosis not present

## 2012-02-15 DIAGNOSIS — R509 Fever, unspecified: Secondary | ICD-10-CM | POA: Diagnosis not present

## 2012-02-21 DIAGNOSIS — R7881 Bacteremia: Secondary | ICD-10-CM | POA: Diagnosis not present

## 2012-02-21 DIAGNOSIS — B952 Enterococcus as the cause of diseases classified elsewhere: Secondary | ICD-10-CM | POA: Diagnosis not present

## 2012-02-22 DIAGNOSIS — B952 Enterococcus as the cause of diseases classified elsewhere: Secondary | ICD-10-CM | POA: Diagnosis not present

## 2012-02-22 DIAGNOSIS — R509 Fever, unspecified: Secondary | ICD-10-CM | POA: Diagnosis not present

## 2012-02-22 DIAGNOSIS — E1129 Type 2 diabetes mellitus with other diabetic kidney complication: Secondary | ICD-10-CM | POA: Diagnosis not present

## 2012-02-22 DIAGNOSIS — R7881 Bacteremia: Secondary | ICD-10-CM | POA: Diagnosis not present

## 2012-02-22 DIAGNOSIS — D72829 Elevated white blood cell count, unspecified: Secondary | ICD-10-CM | POA: Diagnosis not present

## 2012-02-22 DIAGNOSIS — Z792 Long term (current) use of antibiotics: Secondary | ICD-10-CM | POA: Diagnosis not present

## 2012-02-26 DIAGNOSIS — R7881 Bacteremia: Secondary | ICD-10-CM | POA: Diagnosis not present

## 2012-02-26 DIAGNOSIS — E1129 Type 2 diabetes mellitus with other diabetic kidney complication: Secondary | ICD-10-CM | POA: Diagnosis not present

## 2012-02-26 DIAGNOSIS — D72829 Elevated white blood cell count, unspecified: Secondary | ICD-10-CM | POA: Diagnosis not present

## 2012-02-26 DIAGNOSIS — Z792 Long term (current) use of antibiotics: Secondary | ICD-10-CM | POA: Diagnosis not present

## 2012-02-26 DIAGNOSIS — R509 Fever, unspecified: Secondary | ICD-10-CM | POA: Diagnosis not present

## 2012-02-26 DIAGNOSIS — B952 Enterococcus as the cause of diseases classified elsewhere: Secondary | ICD-10-CM | POA: Diagnosis not present

## 2012-03-01 DIAGNOSIS — B351 Tinea unguium: Secondary | ICD-10-CM | POA: Diagnosis not present

## 2012-03-01 DIAGNOSIS — L97509 Non-pressure chronic ulcer of other part of unspecified foot with unspecified severity: Secondary | ICD-10-CM | POA: Diagnosis not present

## 2012-03-01 DIAGNOSIS — E1149 Type 2 diabetes mellitus with other diabetic neurological complication: Secondary | ICD-10-CM | POA: Diagnosis not present

## 2012-03-01 DIAGNOSIS — M2040 Other hammer toe(s) (acquired), unspecified foot: Secondary | ICD-10-CM | POA: Diagnosis not present

## 2012-03-05 DIAGNOSIS — I495 Sick sinus syndrome: Secondary | ICD-10-CM | POA: Diagnosis not present

## 2012-03-19 DIAGNOSIS — R7881 Bacteremia: Secondary | ICD-10-CM | POA: Diagnosis not present

## 2012-03-19 DIAGNOSIS — B952 Enterococcus as the cause of diseases classified elsewhere: Secondary | ICD-10-CM | POA: Diagnosis not present

## 2012-03-19 DIAGNOSIS — M869 Osteomyelitis, unspecified: Secondary | ICD-10-CM | POA: Diagnosis not present

## 2012-03-22 DIAGNOSIS — H9209 Otalgia, unspecified ear: Secondary | ICD-10-CM | POA: Diagnosis not present

## 2012-03-22 DIAGNOSIS — H9190 Unspecified hearing loss, unspecified ear: Secondary | ICD-10-CM | POA: Diagnosis not present

## 2012-03-28 DIAGNOSIS — D649 Anemia, unspecified: Secondary | ICD-10-CM | POA: Diagnosis not present

## 2012-04-16 DIAGNOSIS — N289 Disorder of kidney and ureter, unspecified: Secondary | ICD-10-CM | POA: Diagnosis not present

## 2012-04-16 DIAGNOSIS — I10 Essential (primary) hypertension: Secondary | ICD-10-CM | POA: Diagnosis not present

## 2012-04-16 DIAGNOSIS — E119 Type 2 diabetes mellitus without complications: Secondary | ICD-10-CM | POA: Diagnosis not present

## 2012-04-16 DIAGNOSIS — H9209 Otalgia, unspecified ear: Secondary | ICD-10-CM | POA: Diagnosis not present

## 2012-04-16 DIAGNOSIS — R42 Dizziness and giddiness: Secondary | ICD-10-CM | POA: Diagnosis not present

## 2012-04-19 DIAGNOSIS — E109 Type 1 diabetes mellitus without complications: Secondary | ICD-10-CM | POA: Diagnosis not present

## 2012-04-19 DIAGNOSIS — I2510 Atherosclerotic heart disease of native coronary artery without angina pectoris: Secondary | ICD-10-CM | POA: Diagnosis not present

## 2012-04-19 DIAGNOSIS — I10 Essential (primary) hypertension: Secondary | ICD-10-CM | POA: Diagnosis not present

## 2012-04-19 DIAGNOSIS — N189 Chronic kidney disease, unspecified: Secondary | ICD-10-CM | POA: Diagnosis not present

## 2012-04-26 DIAGNOSIS — N2589 Other disorders resulting from impaired renal tubular function: Secondary | ICD-10-CM | POA: Diagnosis not present

## 2012-04-26 DIAGNOSIS — R609 Edema, unspecified: Secondary | ICD-10-CM | POA: Diagnosis not present

## 2012-04-26 DIAGNOSIS — I10 Essential (primary) hypertension: Secondary | ICD-10-CM | POA: Diagnosis not present

## 2012-04-26 DIAGNOSIS — E875 Hyperkalemia: Secondary | ICD-10-CM | POA: Diagnosis not present

## 2012-04-26 DIAGNOSIS — N183 Chronic kidney disease, stage 3 unspecified: Secondary | ICD-10-CM | POA: Diagnosis not present

## 2012-04-30 DIAGNOSIS — B952 Enterococcus as the cause of diseases classified elsewhere: Secondary | ICD-10-CM | POA: Diagnosis not present

## 2012-05-23 DIAGNOSIS — D649 Anemia, unspecified: Secondary | ICD-10-CM | POA: Diagnosis not present

## 2012-05-23 DIAGNOSIS — R5383 Other fatigue: Secondary | ICD-10-CM | POA: Diagnosis not present

## 2012-05-23 DIAGNOSIS — E119 Type 2 diabetes mellitus without complications: Secondary | ICD-10-CM | POA: Diagnosis not present

## 2012-05-23 DIAGNOSIS — R5381 Other malaise: Secondary | ICD-10-CM | POA: Diagnosis not present

## 2012-05-23 DIAGNOSIS — R413 Other amnesia: Secondary | ICD-10-CM | POA: Diagnosis not present

## 2012-05-23 DIAGNOSIS — E538 Deficiency of other specified B group vitamins: Secondary | ICD-10-CM | POA: Diagnosis not present

## 2012-05-23 DIAGNOSIS — N289 Disorder of kidney and ureter, unspecified: Secondary | ICD-10-CM | POA: Diagnosis not present

## 2012-05-23 DIAGNOSIS — Z79899 Other long term (current) drug therapy: Secondary | ICD-10-CM | POA: Diagnosis not present

## 2012-05-24 DIAGNOSIS — L97509 Non-pressure chronic ulcer of other part of unspecified foot with unspecified severity: Secondary | ICD-10-CM | POA: Diagnosis not present

## 2012-05-24 DIAGNOSIS — E1149 Type 2 diabetes mellitus with other diabetic neurological complication: Secondary | ICD-10-CM | POA: Diagnosis not present

## 2012-05-24 DIAGNOSIS — B351 Tinea unguium: Secondary | ICD-10-CM | POA: Diagnosis not present

## 2012-05-31 DIAGNOSIS — R7309 Other abnormal glucose: Secondary | ICD-10-CM | POA: Diagnosis not present

## 2012-06-06 DIAGNOSIS — Z794 Long term (current) use of insulin: Secondary | ICD-10-CM | POA: Diagnosis not present

## 2012-06-06 DIAGNOSIS — E1169 Type 2 diabetes mellitus with other specified complication: Secondary | ICD-10-CM | POA: Diagnosis not present

## 2012-06-06 DIAGNOSIS — L97509 Non-pressure chronic ulcer of other part of unspecified foot with unspecified severity: Secondary | ICD-10-CM | POA: Diagnosis not present

## 2012-06-06 DIAGNOSIS — E538 Deficiency of other specified B group vitamins: Secondary | ICD-10-CM | POA: Diagnosis not present

## 2012-06-08 DIAGNOSIS — I495 Sick sinus syndrome: Secondary | ICD-10-CM | POA: Diagnosis not present

## 2012-06-13 DIAGNOSIS — B351 Tinea unguium: Secondary | ICD-10-CM | POA: Diagnosis not present

## 2012-06-13 DIAGNOSIS — L84 Corns and callosities: Secondary | ICD-10-CM | POA: Diagnosis not present

## 2012-06-13 DIAGNOSIS — E119 Type 2 diabetes mellitus without complications: Secondary | ICD-10-CM | POA: Diagnosis not present

## 2012-06-14 DIAGNOSIS — E538 Deficiency of other specified B group vitamins: Secondary | ICD-10-CM | POA: Diagnosis not present

## 2012-06-14 DIAGNOSIS — E119 Type 2 diabetes mellitus without complications: Secondary | ICD-10-CM | POA: Diagnosis not present

## 2012-06-26 DIAGNOSIS — H26499 Other secondary cataract, unspecified eye: Secondary | ICD-10-CM | POA: Diagnosis not present

## 2012-06-26 DIAGNOSIS — E11329 Type 2 diabetes mellitus with mild nonproliferative diabetic retinopathy without macular edema: Secondary | ICD-10-CM | POA: Diagnosis not present

## 2012-07-25 DIAGNOSIS — L97509 Non-pressure chronic ulcer of other part of unspecified foot with unspecified severity: Secondary | ICD-10-CM | POA: Diagnosis not present

## 2012-07-25 DIAGNOSIS — M2040 Other hammer toe(s) (acquired), unspecified foot: Secondary | ICD-10-CM | POA: Diagnosis not present

## 2012-07-25 DIAGNOSIS — E1149 Type 2 diabetes mellitus with other diabetic neurological complication: Secondary | ICD-10-CM | POA: Diagnosis not present

## 2012-07-30 DIAGNOSIS — H353190 Nonexudative age-related macular degeneration, unspecified eye, stage unspecified: Secondary | ICD-10-CM | POA: Diagnosis not present

## 2012-07-30 DIAGNOSIS — H43399 Other vitreous opacities, unspecified eye: Secondary | ICD-10-CM | POA: Diagnosis not present

## 2012-07-30 DIAGNOSIS — H53419 Scotoma involving central area, unspecified eye: Secondary | ICD-10-CM | POA: Diagnosis not present

## 2012-08-08 DIAGNOSIS — N189 Chronic kidney disease, unspecified: Secondary | ICD-10-CM | POA: Diagnosis not present

## 2012-08-08 DIAGNOSIS — D649 Anemia, unspecified: Secondary | ICD-10-CM | POA: Diagnosis not present

## 2012-08-16 DIAGNOSIS — N183 Chronic kidney disease, stage 3 unspecified: Secondary | ICD-10-CM | POA: Diagnosis not present

## 2012-08-16 DIAGNOSIS — R609 Edema, unspecified: Secondary | ICD-10-CM | POA: Diagnosis not present

## 2012-08-16 DIAGNOSIS — N2589 Other disorders resulting from impaired renal tubular function: Secondary | ICD-10-CM | POA: Diagnosis not present

## 2012-08-16 DIAGNOSIS — E875 Hyperkalemia: Secondary | ICD-10-CM | POA: Diagnosis not present

## 2012-08-16 DIAGNOSIS — I10 Essential (primary) hypertension: Secondary | ICD-10-CM | POA: Diagnosis not present

## 2012-09-14 DIAGNOSIS — I495 Sick sinus syndrome: Secondary | ICD-10-CM | POA: Diagnosis not present

## 2012-09-19 DIAGNOSIS — E1142 Type 2 diabetes mellitus with diabetic polyneuropathy: Secondary | ICD-10-CM | POA: Diagnosis not present

## 2012-09-19 DIAGNOSIS — E1149 Type 2 diabetes mellitus with other diabetic neurological complication: Secondary | ICD-10-CM | POA: Diagnosis not present

## 2012-09-19 DIAGNOSIS — L97509 Non-pressure chronic ulcer of other part of unspecified foot with unspecified severity: Secondary | ICD-10-CM | POA: Diagnosis not present

## 2012-09-25 DIAGNOSIS — E119 Type 2 diabetes mellitus without complications: Secondary | ICD-10-CM | POA: Diagnosis not present

## 2012-10-09 DIAGNOSIS — D472 Monoclonal gammopathy: Secondary | ICD-10-CM | POA: Diagnosis not present

## 2012-10-09 DIAGNOSIS — E119 Type 2 diabetes mellitus without complications: Secondary | ICD-10-CM | POA: Diagnosis not present

## 2012-10-18 DIAGNOSIS — I2510 Atherosclerotic heart disease of native coronary artery without angina pectoris: Secondary | ICD-10-CM | POA: Diagnosis not present

## 2012-10-18 DIAGNOSIS — I10 Essential (primary) hypertension: Secondary | ICD-10-CM | POA: Diagnosis not present

## 2012-12-19 DIAGNOSIS — I10 Essential (primary) hypertension: Secondary | ICD-10-CM | POA: Diagnosis not present

## 2012-12-19 DIAGNOSIS — E109 Type 1 diabetes mellitus without complications: Secondary | ICD-10-CM | POA: Diagnosis not present

## 2012-12-19 DIAGNOSIS — I2510 Atherosclerotic heart disease of native coronary artery without angina pectoris: Secondary | ICD-10-CM | POA: Diagnosis not present

## 2012-12-19 DIAGNOSIS — I495 Sick sinus syndrome: Secondary | ICD-10-CM | POA: Diagnosis not present

## 2013-02-06 DIAGNOSIS — M2040 Other hammer toe(s) (acquired), unspecified foot: Secondary | ICD-10-CM | POA: Diagnosis not present

## 2013-02-06 DIAGNOSIS — L97509 Non-pressure chronic ulcer of other part of unspecified foot with unspecified severity: Secondary | ICD-10-CM | POA: Diagnosis not present

## 2013-02-06 DIAGNOSIS — E1149 Type 2 diabetes mellitus with other diabetic neurological complication: Secondary | ICD-10-CM | POA: Diagnosis not present

## 2013-02-06 DIAGNOSIS — Z23 Encounter for immunization: Secondary | ICD-10-CM | POA: Diagnosis not present

## 2013-02-06 DIAGNOSIS — B351 Tinea unguium: Secondary | ICD-10-CM | POA: Diagnosis not present

## 2013-02-11 DIAGNOSIS — I129 Hypertensive chronic kidney disease with stage 1 through stage 4 chronic kidney disease, or unspecified chronic kidney disease: Secondary | ICD-10-CM | POA: Diagnosis not present

## 2013-02-11 DIAGNOSIS — N189 Chronic kidney disease, unspecified: Secondary | ICD-10-CM | POA: Diagnosis not present

## 2013-02-21 DIAGNOSIS — N183 Chronic kidney disease, stage 3 unspecified: Secondary | ICD-10-CM | POA: Diagnosis not present

## 2013-02-21 DIAGNOSIS — E875 Hyperkalemia: Secondary | ICD-10-CM | POA: Diagnosis not present

## 2013-02-21 DIAGNOSIS — I10 Essential (primary) hypertension: Secondary | ICD-10-CM | POA: Diagnosis not present

## 2013-02-21 DIAGNOSIS — R609 Edema, unspecified: Secondary | ICD-10-CM | POA: Diagnosis not present

## 2013-02-21 DIAGNOSIS — N2589 Other disorders resulting from impaired renal tubular function: Secondary | ICD-10-CM | POA: Diagnosis not present

## 2013-03-24 DIAGNOSIS — I495 Sick sinus syndrome: Secondary | ICD-10-CM | POA: Diagnosis not present

## 2013-04-03 DIAGNOSIS — I10 Essential (primary) hypertension: Secondary | ICD-10-CM | POA: Diagnosis not present

## 2013-04-03 DIAGNOSIS — E119 Type 2 diabetes mellitus without complications: Secondary | ICD-10-CM | POA: Diagnosis not present

## 2013-04-03 DIAGNOSIS — J069 Acute upper respiratory infection, unspecified: Secondary | ICD-10-CM | POA: Diagnosis not present

## 2013-04-04 DIAGNOSIS — Z79899 Other long term (current) drug therapy: Secondary | ICD-10-CM | POA: Diagnosis not present

## 2013-04-04 DIAGNOSIS — I10 Essential (primary) hypertension: Secondary | ICD-10-CM | POA: Diagnosis not present

## 2013-04-04 DIAGNOSIS — E119 Type 2 diabetes mellitus without complications: Secondary | ICD-10-CM | POA: Diagnosis not present

## 2013-04-10 DIAGNOSIS — E1165 Type 2 diabetes mellitus with hyperglycemia: Secondary | ICD-10-CM | POA: Diagnosis not present

## 2013-04-10 DIAGNOSIS — IMO0002 Reserved for concepts with insufficient information to code with codable children: Secondary | ICD-10-CM | POA: Diagnosis not present

## 2013-04-10 DIAGNOSIS — E785 Hyperlipidemia, unspecified: Secondary | ICD-10-CM | POA: Diagnosis not present

## 2013-04-24 DIAGNOSIS — R229 Localized swelling, mass and lump, unspecified: Secondary | ICD-10-CM | POA: Diagnosis not present

## 2013-04-24 DIAGNOSIS — L989 Disorder of the skin and subcutaneous tissue, unspecified: Secondary | ICD-10-CM | POA: Diagnosis not present

## 2013-04-24 DIAGNOSIS — L821 Other seborrheic keratosis: Secondary | ICD-10-CM | POA: Diagnosis not present

## 2013-05-01 DIAGNOSIS — L821 Other seborrheic keratosis: Secondary | ICD-10-CM | POA: Diagnosis not present

## 2013-05-01 DIAGNOSIS — C44320 Squamous cell carcinoma of skin of unspecified parts of face: Secondary | ICD-10-CM | POA: Diagnosis not present

## 2013-05-01 DIAGNOSIS — L570 Actinic keratosis: Secondary | ICD-10-CM | POA: Diagnosis not present

## 2013-05-01 DIAGNOSIS — L989 Disorder of the skin and subcutaneous tissue, unspecified: Secondary | ICD-10-CM | POA: Diagnosis not present

## 2013-05-07 DIAGNOSIS — Z4802 Encounter for removal of sutures: Secondary | ICD-10-CM | POA: Diagnosis not present

## 2013-05-15 DIAGNOSIS — L821 Other seborrheic keratosis: Secondary | ICD-10-CM | POA: Diagnosis not present

## 2013-05-15 DIAGNOSIS — C44320 Squamous cell carcinoma of skin of unspecified parts of face: Secondary | ICD-10-CM | POA: Diagnosis not present

## 2013-06-12 DIAGNOSIS — E119 Type 2 diabetes mellitus without complications: Secondary | ICD-10-CM | POA: Diagnosis not present

## 2013-06-12 DIAGNOSIS — E785 Hyperlipidemia, unspecified: Secondary | ICD-10-CM | POA: Diagnosis not present

## 2013-07-07 DIAGNOSIS — I10 Essential (primary) hypertension: Secondary | ICD-10-CM | POA: Diagnosis not present

## 2013-07-07 DIAGNOSIS — E860 Dehydration: Secondary | ICD-10-CM | POA: Diagnosis not present

## 2013-07-07 DIAGNOSIS — IMO0001 Reserved for inherently not codable concepts without codable children: Secondary | ICD-10-CM | POA: Diagnosis not present

## 2013-07-07 DIAGNOSIS — Z950 Presence of cardiac pacemaker: Secondary | ICD-10-CM | POA: Diagnosis not present

## 2013-07-07 DIAGNOSIS — R079 Chest pain, unspecified: Secondary | ICD-10-CM | POA: Diagnosis not present

## 2013-07-07 DIAGNOSIS — R1110 Vomiting, unspecified: Secondary | ICD-10-CM | POA: Diagnosis not present

## 2013-07-07 DIAGNOSIS — I959 Hypotension, unspecified: Secondary | ICD-10-CM | POA: Diagnosis not present

## 2013-07-07 DIAGNOSIS — R42 Dizziness and giddiness: Secondary | ICD-10-CM | POA: Diagnosis not present

## 2013-07-07 DIAGNOSIS — I110 Hypertensive heart disease with heart failure: Secondary | ICD-10-CM | POA: Diagnosis not present

## 2013-07-07 DIAGNOSIS — R109 Unspecified abdominal pain: Secondary | ICD-10-CM | POA: Diagnosis not present

## 2013-07-15 DIAGNOSIS — I495 Sick sinus syndrome: Secondary | ICD-10-CM | POA: Diagnosis not present

## 2013-07-16 DIAGNOSIS — E119 Type 2 diabetes mellitus without complications: Secondary | ICD-10-CM | POA: Diagnosis not present

## 2013-07-22 DIAGNOSIS — H53419 Scotoma involving central area, unspecified eye: Secondary | ICD-10-CM | POA: Diagnosis not present

## 2013-07-22 DIAGNOSIS — E1139 Type 2 diabetes mellitus with other diabetic ophthalmic complication: Secondary | ICD-10-CM | POA: Diagnosis not present

## 2013-07-22 DIAGNOSIS — H26499 Other secondary cataract, unspecified eye: Secondary | ICD-10-CM | POA: Diagnosis not present

## 2013-07-22 DIAGNOSIS — H353190 Nonexudative age-related macular degeneration, unspecified eye, stage unspecified: Secondary | ICD-10-CM | POA: Diagnosis not present

## 2013-07-23 DIAGNOSIS — E119 Type 2 diabetes mellitus without complications: Secondary | ICD-10-CM | POA: Diagnosis not present

## 2013-07-23 DIAGNOSIS — R5381 Other malaise: Secondary | ICD-10-CM | POA: Diagnosis not present

## 2013-07-23 DIAGNOSIS — R5383 Other fatigue: Secondary | ICD-10-CM | POA: Diagnosis not present

## 2013-07-23 DIAGNOSIS — I10 Essential (primary) hypertension: Secondary | ICD-10-CM | POA: Diagnosis not present

## 2013-07-26 DIAGNOSIS — I10 Essential (primary) hypertension: Secondary | ICD-10-CM | POA: Diagnosis not present

## 2013-07-26 DIAGNOSIS — E119 Type 2 diabetes mellitus without complications: Secondary | ICD-10-CM | POA: Diagnosis not present

## 2013-07-26 DIAGNOSIS — E785 Hyperlipidemia, unspecified: Secondary | ICD-10-CM | POA: Diagnosis not present

## 2013-07-26 DIAGNOSIS — N189 Chronic kidney disease, unspecified: Secondary | ICD-10-CM | POA: Diagnosis not present

## 2013-08-06 DIAGNOSIS — E785 Hyperlipidemia, unspecified: Secondary | ICD-10-CM | POA: Diagnosis not present

## 2013-08-06 DIAGNOSIS — N189 Chronic kidney disease, unspecified: Secondary | ICD-10-CM | POA: Diagnosis not present

## 2013-08-06 DIAGNOSIS — E119 Type 2 diabetes mellitus without complications: Secondary | ICD-10-CM | POA: Diagnosis not present

## 2013-08-06 DIAGNOSIS — I10 Essential (primary) hypertension: Secondary | ICD-10-CM | POA: Diagnosis not present

## 2013-08-07 DIAGNOSIS — H524 Presbyopia: Secondary | ICD-10-CM | POA: Diagnosis not present

## 2013-08-07 DIAGNOSIS — H52229 Regular astigmatism, unspecified eye: Secondary | ICD-10-CM | POA: Diagnosis not present

## 2013-08-07 DIAGNOSIS — H353190 Nonexudative age-related macular degeneration, unspecified eye, stage unspecified: Secondary | ICD-10-CM | POA: Diagnosis not present

## 2013-08-07 DIAGNOSIS — H53419 Scotoma involving central area, unspecified eye: Secondary | ICD-10-CM | POA: Diagnosis not present

## 2013-08-20 DIAGNOSIS — I10 Essential (primary) hypertension: Secondary | ICD-10-CM | POA: Diagnosis not present

## 2013-08-20 DIAGNOSIS — N183 Chronic kidney disease, stage 3 unspecified: Secondary | ICD-10-CM | POA: Diagnosis not present

## 2013-08-20 DIAGNOSIS — R609 Edema, unspecified: Secondary | ICD-10-CM | POA: Diagnosis not present

## 2013-08-20 DIAGNOSIS — N2589 Other disorders resulting from impaired renal tubular function: Secondary | ICD-10-CM | POA: Diagnosis not present

## 2013-08-20 DIAGNOSIS — E875 Hyperkalemia: Secondary | ICD-10-CM | POA: Diagnosis not present

## 2013-08-30 DIAGNOSIS — E669 Obesity, unspecified: Secondary | ICD-10-CM | POA: Diagnosis not present

## 2013-08-30 DIAGNOSIS — E119 Type 2 diabetes mellitus without complications: Secondary | ICD-10-CM | POA: Diagnosis not present

## 2013-08-30 DIAGNOSIS — E785 Hyperlipidemia, unspecified: Secondary | ICD-10-CM | POA: Diagnosis not present

## 2013-08-30 DIAGNOSIS — I10 Essential (primary) hypertension: Secondary | ICD-10-CM | POA: Diagnosis not present

## 2013-10-01 ENCOUNTER — Encounter: Payer: Self-pay | Admitting: *Deleted

## 2013-10-02 ENCOUNTER — Encounter: Payer: Self-pay | Admitting: Internal Medicine

## 2013-10-02 ENCOUNTER — Ambulatory Visit (INDEPENDENT_AMBULATORY_CARE_PROVIDER_SITE_OTHER): Payer: Medicare Other | Admitting: Internal Medicine

## 2013-10-02 VITALS — BP 132/56 | HR 66 | Ht 76.0 in | Wt 269.0 lb

## 2013-10-02 DIAGNOSIS — I498 Other specified cardiac arrhythmias: Secondary | ICD-10-CM | POA: Diagnosis not present

## 2013-10-02 DIAGNOSIS — Z950 Presence of cardiac pacemaker: Secondary | ICD-10-CM | POA: Diagnosis not present

## 2013-10-02 DIAGNOSIS — I495 Sick sinus syndrome: Secondary | ICD-10-CM | POA: Diagnosis not present

## 2013-10-02 DIAGNOSIS — R001 Bradycardia, unspecified: Secondary | ICD-10-CM

## 2013-10-02 MED ORDER — ISOSORBIDE MONONITRATE ER 30 MG PO TB24: 60.0000 mg | ORAL_TABLET | Freq: Every day | ORAL | Status: DC

## 2013-10-02 MED ORDER — TORSEMIDE 20 MG PO TABS: 40.0000 mg | ORAL_TABLET | Freq: Every day | ORAL | Status: DC

## 2013-10-02 NOTE — Progress Notes (Signed)
ELECTROPHYSIOLOGY CONSULT NOTE  Patient ID: David [name], MRN: [id number], DOB/AGE: 73-Oct-1942 73 y.o. Admit date: (Not on file) Date of Consult: 10/02/2013  Primary Physician: No primary provider on file. Primary Cardiologist: new   Chief Complaint: establish   HPI David [name] is a 73 y.o. male  Seen to establish pacemaker in cardiac followup.  A pacemaker was implanted initially for sinus node dysfunction in 2009. 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  He notes recently however that there is increasing dyspnea on exertion. This is unaccompanied by chest discomfort. He has had increasing problems with swelling. He has treated sleep apnea.  He has no known history of atrial fibrillation  He is not on statin therapy as best as we can tell.  Evaluation [place] included a Myoview that was false positive catheterizations were undertaken in 2009, then, 12 demonstrating 60% of the RCA, 70% in 240% circumflex and 60% OM1 40% LAD.   TEE 2013 EF 60-65%.  Past Medical History  Diagnosis Date  . Diabetes   . Hypertension   . CAD (coronary artery disease)   . Renal insufficiency   . Sleep apnea   . Palpitations   . Dyspnea   . Sinus bradycardia   . Obesity   . Allergy   . Kidney failure   . Arthritis   . Diabetes   . Anxiety   . Cancer     skin      Surgical History:  Past Surgical History  Procedure Laterality Date  . Insert / replace / remove pacemaker    . Knee surgery    . Vein ligation and stripping    . 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 Authorizing Provider  amLODipine (NORVASC) 10 MG tablet Take 10 mg by mouth daily.   Yes Historical Provider, MD  aspirin 81 MG tablet Take 81 mg by mouth daily.   Yes Historical Provider, MD  carvedilol (COREG) 25 MG tablet Take 25 mg by mouth 2 (two) times daily with  a meal.   Yes Historical Provider, MD  gemfibrozil (LOPID) 600 MG tablet Take 600 mg by mouth 2 (two) times daily before a meal.   Yes Historical Provider, MD  glipiZIDE (GLUCOTROL) 10 MG tablet Take 10 mg by mouth 2 (two) times daily before a meal.   Yes Historical Provider, MD  insulin aspart (NOVOLOG) 100 UNIT/ML injection Inject into the skin. As directed   Yes Historical Provider, MD  insulin glargine (LANTUS) 100 UNIT/ML injection Inject 100 Units into the skin. As directed   Yes Historical Provider, MD  isosorbide mononitrate (IMDUR) 30 MG 24 hr tablet Take 30 mg by mouth daily.   Yes Historical Provider, MD  lisinopril (PRINIVIL,ZESTRIL) 10 MG tablet Take 10 mg by mouth daily.   Yes Historical Provider, MD  Loratadine 10 MG CAPS Take by mouth daily.   Yes Historical Provider, MD  Omega-3 Fatty Acids (FISH OIL) 1000 MG CAPS Take by mouth 2 (two) times daily.   Yes Historical Provider, MD  torsemide (DEMADEX) 20 MG tablet Take 20 mg by mouth daily.   Yes Historical Provider, MD      Allergies: No Known Allergies  History   Social History  . Marital Status: Married    Spouse Name: N/A    Number of Children: N/A  .  Years of Education: N/A   Occupational History  . Not on file.   Social History Main Topics  . Smoking status: Former Research scientist (life sciences)  . Smokeless tobacco: Not on file  . Alcohol Use: Not on file  . Drug Use: Not on file  . Sexual Activity: Not on file   Other Topics Concern  . Not on file   Social History Narrative  . No narrative on file     Family History  Problem Relation Age of Onset  . Cancer Mother     breast  . Heart attack Father      ROS:  Please see the history of present illness.   Negative except Depression, skin cancer,  All other systems reviewed and negative.    Physical Exam   Blood pressure 132/56, pulse 66, height 6\' 4"  (1.93 m), weight 269 lb (122.018 kg). General: Well developed, well nourished male in no acute distress. Head:  Normocephalic, atraumatic, sclera non-icteric, no xanthomas, nares are without discharge. EENT: normal Lymph Nodes:  none Back: without scoliosis/kyphosi  no CVA tendersness Neck: Negative for carotid bruits. JVD 8 Lungs: Clear bilaterally to auscultation without wheezes, rales, or rhonchi. Breathing is unlabored. Heart: RRR with S1 S2. 2/6 systolic murmur , rubs, or gallops appreciated. Abdomen: Soft, non-tender, non-distended with normoactive bowel sounds. No hepatomegaly. No rebound/guarding. No obvious abdominal masses. Msk:  Strength and tone appear normal for age. Extremities: No clubbing or cyanosis.  1+ edema.  Distal pedal pulses are 2+ and equal bilaterally. Skin: Warm and Dry Neuro: Alert and oriented X 3. CN III-XII intact Grossly normal sensory and motor function . Psych:  Responds to questions appropriately with a normal affect.      Labs: Cardiac Enzymes No results found for this basename: CKTOTAL, CKMB, TROPONINI,  in the last 72 hours CBC No results found for this basename: WBC, HGB, HCT, MCV, PLT   PROTIME: No results found for this basename: LABPROT, INR,  in the last 72 hours Chemistry No results found for this basename: NA, K, CL, CO2, BUN, CREATININE, CALCIUM, LABALBU, PROT, BILITOT, ALKPHOS, ALT, AST, GLUCOSE,  in the last 168 hours Lipids No results found for this basename: CHOL, HDL, LDLCALC, TRIG   BNP No results found for this basename: probnp   Miscellaneous No results found for this basename: DDIMER    Radiology/Studies:  No results found.  EKG:   Assessment and Plan:  Dyspnea on exertion  Nonobstructive coronary disease  Congestive heart failure (HFpEF)  Pacemaker-Medtronic The patient's device was interrogated.  The information was reviewed. No changes were made in the programming.     Sinus node dysfunction  Renal dysfunction  Hypertension  Sleep apnea-treated  The patient has evidence of volume overload both the peripheral  edema jugular venous distention orthopnea and dyspnea on exertion.  Amlodipine may be contributing to his edema so we will discontinue it.   the we will increase his Demadex gently from 20-40 mg daily for 1 week. We'll check a metabolic profile   we will also check an echo given his symptoms. There has been a concern apparently in the past given his renal insufficiency is to catheterization and/or intervention. Will have to transiently.    [name]

## 2013-10-02 NOTE — Patient Instructions (Signed)
Your physician has recommended you make the following change in your medication:  1) Stop Amlodipine  2) Increase Torsemide to 40 mg every morning for 1 week, then return to normal dosing (20 mg daily) 3) Increase Isosorbide Mononitrate to 60 mg daily  Your physician recommends that you return for lab work in: 10 days for BMET  Your physician has requested that you have an echocardiogram. Echocardiography is a painless test that uses sound waves to create images of your heart. It provides your doctor with information about the size and shape of your heart and how well your heart's chambers and valves are working. This procedure takes approximately one hour. There are no restrictions for this procedure.  Your physician recommends that you schedule a follow-up appointment in: 4 weeks with Dr. Caryl Comes.

## 2013-10-03 ENCOUNTER — Encounter: Payer: Self-pay | Admitting: Internal Medicine

## 2013-10-03 LAB — MDC_IDC_ENUM_SESS_TYPE_INCLINIC
Battery Impedance: 2385 Ohm
Battery Remaining Longevity: 23 mo
Battery Voltage: 2.74 V
Brady Statistic AP VP Percent: 54 %
Brady Statistic AP VS Percent: 39 %
Brady Statistic AS VP Percent: 6 %
Brady Statistic AS VS Percent: 1 %
Date Time Interrogation Session: 20150520190236
Lead Channel Impedance Value: 489 Ohm
Lead Channel Impedance Value: 602 Ohm
Lead Channel Pacing Threshold Amplitude: 1 V
Lead Channel Pacing Threshold Amplitude: 1.25 V
Lead Channel Pacing Threshold Pulse Width: 0.4 ms
Lead Channel Pacing Threshold Pulse Width: 1 ms
Lead Channel Sensing Intrinsic Amplitude: 15.67 mV
Lead Channel Sensing Intrinsic Amplitude: 2 mV
Lead Channel Setting Pacing Amplitude: 2.5 V
Lead Channel Setting Pacing Amplitude: 2.5 V
Lead Channel Setting Pacing Pulse Width: 0.4 ms
Lead Channel Setting Sensing Sensitivity: 5.6 mV

## 2013-10-14 ENCOUNTER — Ambulatory Visit (INDEPENDENT_AMBULATORY_CARE_PROVIDER_SITE_OTHER): Payer: Medicare Other | Admitting: *Deleted

## 2013-10-14 ENCOUNTER — Telehealth: Payer: Self-pay | Admitting: Internal Medicine

## 2013-10-14 DIAGNOSIS — I10 Essential (primary) hypertension: Secondary | ICD-10-CM | POA: Diagnosis not present

## 2013-10-14 LAB — BASIC METABOLIC PANEL WITH GFR
BUN: 41 mg/dL — ABNORMAL HIGH (ref 6–23)
CO2: 27 meq/L (ref 19–32)
Calcium: 9.1 mg/dL (ref 8.4–10.5)
Chloride: 102 meq/L (ref 96–112)
Creatinine, Ser: 1.9 mg/dL — ABNORMAL HIGH (ref 0.4–1.5)
GFR: 38.02 mL/min — ABNORMAL LOW
Glucose, Bld: 319 mg/dL — ABNORMAL HIGH (ref 70–99)
Potassium: 4.5 meq/L (ref 3.5–5.1)
Sodium: 136 meq/L (ref 135–145)

## 2013-10-14 NOTE — Telephone Encounter (Signed)
Walk In pt Form " Records" Dropped Off Sherri back on Tuesday (6.2)

## 2013-10-22 ENCOUNTER — Ambulatory Visit (HOSPITAL_COMMUNITY): Payer: Medicare Other | Attending: Cardiovascular Disease | Admitting: Cardiology

## 2013-10-22 ENCOUNTER — Ambulatory Visit (INDEPENDENT_AMBULATORY_CARE_PROVIDER_SITE_OTHER): Payer: Medicare Other | Admitting: *Deleted

## 2013-10-22 DIAGNOSIS — R0602 Shortness of breath: Secondary | ICD-10-CM

## 2013-10-22 DIAGNOSIS — I498 Other specified cardiac arrhythmias: Secondary | ICD-10-CM | POA: Diagnosis not present

## 2013-10-22 DIAGNOSIS — I10 Essential (primary) hypertension: Secondary | ICD-10-CM | POA: Diagnosis not present

## 2013-10-22 DIAGNOSIS — I495 Sick sinus syndrome: Secondary | ICD-10-CM | POA: Diagnosis not present

## 2013-10-22 DIAGNOSIS — I2510 Atherosclerotic heart disease of native coronary artery without angina pectoris: Secondary | ICD-10-CM

## 2013-10-22 DIAGNOSIS — R001 Bradycardia, unspecified: Secondary | ICD-10-CM

## 2013-10-22 LAB — BASIC METABOLIC PANEL
BUN: 35 mg/dL — ABNORMAL HIGH (ref 6–23)
CO2: 24 mEq/L (ref 19–32)
Calcium: 9.3 mg/dL (ref 8.4–10.5)
Chloride: 102 mEq/L (ref 96–112)
Creatinine, Ser: 1.8 mg/dL — ABNORMAL HIGH (ref 0.4–1.5)
GFR: 40.26 mL/min — ABNORMAL LOW (ref >60.00)
Glucose, Bld: 417 mg/dL — ABNORMAL HIGH (ref 70–99)
Potassium: 4.5 mEq/L (ref 3.5–5.1)
Sodium: 135 mEq/L (ref 135–145)

## 2013-10-22 NOTE — Progress Notes (Signed)
Echo performed. 

## 2013-10-25 ENCOUNTER — Encounter: Payer: Self-pay | Admitting: Family

## 2013-10-25 ENCOUNTER — Ambulatory Visit (INDEPENDENT_AMBULATORY_CARE_PROVIDER_SITE_OTHER): Payer: Medicare Other | Admitting: Family

## 2013-10-25 ENCOUNTER — Other Ambulatory Visit: Payer: Medicare Other

## 2013-10-25 ENCOUNTER — Telehealth: Payer: Self-pay | Admitting: Family

## 2013-10-25 VITALS — BP 120/56 | HR 62 | Temp 98.4°F | Ht 76.0 in | Wt 275.0 lb

## 2013-10-25 DIAGNOSIS — E7800 Pure hypercholesterolemia, unspecified: Secondary | ICD-10-CM

## 2013-10-25 DIAGNOSIS — N183 Chronic kidney disease, stage 3 unspecified: Secondary | ICD-10-CM | POA: Insufficient documentation

## 2013-10-25 DIAGNOSIS — E1122 Type 2 diabetes mellitus with diabetic chronic kidney disease: Secondary | ICD-10-CM | POA: Insufficient documentation

## 2013-10-25 DIAGNOSIS — N289 Disorder of kidney and ureter, unspecified: Secondary | ICD-10-CM | POA: Diagnosis not present

## 2013-10-25 DIAGNOSIS — E1169 Type 2 diabetes mellitus with other specified complication: Secondary | ICD-10-CM

## 2013-10-25 DIAGNOSIS — E1159 Type 2 diabetes mellitus with other circulatory complications: Secondary | ICD-10-CM

## 2013-10-25 DIAGNOSIS — I152 Hypertension secondary to endocrine disorders: Secondary | ICD-10-CM | POA: Insufficient documentation

## 2013-10-25 DIAGNOSIS — I10 Essential (primary) hypertension: Secondary | ICD-10-CM

## 2013-10-25 DIAGNOSIS — E118 Type 2 diabetes mellitus with unspecified complications: Secondary | ICD-10-CM | POA: Insufficient documentation

## 2013-10-25 DIAGNOSIS — J3089 Other allergic rhinitis: Secondary | ICD-10-CM

## 2013-10-25 DIAGNOSIS — J302 Other seasonal allergic rhinitis: Secondary | ICD-10-CM | POA: Insufficient documentation

## 2013-10-25 DIAGNOSIS — J309 Allergic rhinitis, unspecified: Secondary | ICD-10-CM | POA: Diagnosis not present

## 2013-10-25 DIAGNOSIS — I2510 Atherosclerotic heart disease of native coronary artery without angina pectoris: Secondary | ICD-10-CM | POA: Diagnosis not present

## 2013-10-25 DIAGNOSIS — E785 Hyperlipidemia, unspecified: Secondary | ICD-10-CM

## 2013-10-25 DIAGNOSIS — H6120 Impacted cerumen, unspecified ear: Secondary | ICD-10-CM

## 2013-10-25 HISTORY — DX: Hypertension secondary to endocrine disorders: I15.2

## 2013-10-25 HISTORY — DX: Type 2 diabetes mellitus with other circulatory complications: E11.59

## 2013-10-25 HISTORY — DX: Type 2 diabetes mellitus with other specified complication: E11.69

## 2013-10-25 HISTORY — DX: Other seasonal allergic rhinitis: J30.2

## 2013-10-25 MED ORDER — INSULIN ASPART 100 UNIT/ML ~~LOC~~ SOLN: 20.0000 [IU] | Freq: Two times a day (BID) | SUBCUTANEOUS | Status: DC

## 2013-10-25 MED ORDER — INSULIN LISPRO PROT & LISPRO (75-25 MIX) 100 UNIT/ML KWIKPEN: 40.0000 [IU] | PEN_INJECTOR | Freq: Two times a day (BID) | SUBCUTANEOUS | Status: DC

## 2013-10-25 NOTE — Progress Notes (Signed)
Subjective:    Patient ID: David [name], male    DOB: [birth year], 73 y.o.   MRN: [id number]  HPI 73 year old white male, nonsmoker, with a history of type 2 diabetes-uncontrolled, peripheral vascular disease,  hyperlipidemia, hypertension, allergic rhinitis, renal insufficiency, coronary artery disease, is in today to reestablish. He has a pacemaker. He has established with heart care. Reports having blood sugars that range 300-350. Recently switched to NovoLog 75/25. 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 He and his wife a walk of mile daily. However, she reports is more short of breath recently. He scheduled to have a cardiac cath done. Denies any chest pain.   Review of Systems  Constitutional: Negative.   HENT: Negative.   Respiratory: Negative.   Cardiovascular: Negative.   Gastrointestinal: Negative.   Endocrine: Negative.   Genitourinary: Negative.   Musculoskeletal: Negative.   Skin: Negative.   Allergic/Immunologic: Negative.   Neurological: Negative.   Hematological: Negative.   Psychiatric/Behavioral: Negative.    Past Medical History  Diagnosis Date  . Diabetes   . Hypertension   . CAD (coronary artery disease)   . Renal insufficiency   . Sleep apnea   . Palpitations   . Dyspnea   . Sinus bradycardia   . Obesity   . Allergy   . Kidney failure   . Arthritis   . Diabetes   . Anxiety   . Cancer     skin    History   Social History  . Marital Status: Married    Spouse Name: N/A    Number of Children: N/A  . Years of Education: N/A   Occupational History  . Not on file.   Social History Main Topics  . Smoking status: Former Research scientist (life sciences)  . Smokeless tobacco: Not on file  . Alcohol Use: Not on file  . Drug Use: Not on file  . Sexual Activity: Not on file   Other Topics Concern  . Not on file   Social History Narrative  . No narrative on file    Past Surgical History  Procedure Laterality Date  . Insert / replace / remove pacemaker     . Knee surgery    . Vein ligation and stripping    . 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 Cancer Mother     breast  . Heart attack Father     No Known Allergies  Current Outpatient Prescriptions on File Prior to Visit  Medication Sig Dispense Refill  . aspirin 81 MG tablet Take 81 mg by mouth daily.      . carvedilol (COREG) 25 MG tablet Take 25 mg by mouth 2 (two) times daily with a meal.      . gemfibrozil (LOPID) 600 MG tablet Take 600 mg by mouth 2 (two) times daily before a meal.      . glipiZIDE (GLUCOTROL) 10 MG tablet Take 10 mg by mouth 2 (two) times daily before a meal.      . isosorbide mononitrate (IMDUR) 30 MG 24 hr tablet Take 2 tablets (60 mg total) by mouth daily.  60 tablet  1  . lisinopril (PRINIVIL,ZESTRIL) 10 MG tablet Take 10 mg by mouth daily.      . Loratadine 10 MG CAPS Take by mouth daily.      . Omega-3 Fatty Acids (FISH OIL) 1000 MG CAPS Take by  mouth 2 (two) times daily.      Marland Kitchen torsemide (DEMADEX) 20 MG tablet Take 2 tablets (40 mg total) by mouth daily.  7 tablet  0   No current facility-administered medications on file prior to visit.    BP 120/56  Pulse 62  Temp(Src) 98.4 F (36.9 C) (Oral)  Ht 6\' 4"  (1.93 m)  Wt 275 lb (124.739 kg)  BMI 33.49 kg/m2  SpO2 98%chart and in     Objective:   Physical Exam  Constitutional: He is oriented to person, place, and time. He appears well-developed and well-nourished.  HENT:  Nose: Nose normal.  Mouth/Throat: Oropharynx is clear and moist.  Ears impacted with cerumen.   Eyes: Pupils are equal, round, and reactive to light.  Neck: Normal range of motion. Neck supple.  Cardiovascular: Normal rate, regular rhythm and normal heart sounds.   Pulmonary/Chest: Effort normal and breath sounds normal.  Abdominal: Soft. Bowel sounds are normal.  Musculoskeletal: Normal range of motion.  Neurological: He is alert and oriented to person,  place, and time.  Skin: Skin is warm and dry.  Psychiatric: He has a normal mood and affect.   Ceruminosis is noted.  Wax is removed by syringing and manual debridement. Instructions for home care to prevent wax buildup are given.       Assessment & Plan:   Problem List Items Addressed This Visit   None    Visit Diagnoses   Pure hypercholesterolemia    -  Primary    Relevant Orders       Hemoglobin A1c       Basic Metabolic Panel       Hepatic Function Panel       CBC with Differential       Lipid Panel    Unspecified essential hypertension        Relevant Orders       Hemoglobin A1c       Basic Metabolic Panel       Hepatic Function Panel       CBC with Differential       Lipid Panel    Renal insufficiency        Relevant Orders       Hemoglobin A1c       Basic Metabolic Panel       Hepatic Function Panel       CBC with Differential       Lipid Panel       Ambulatory referral to Nephrology    Allergic rhinitis        Relevant Orders       Hemoglobin A1c       Basic Metabolic Panel       Hepatic Function Panel       CBC with Differential       Lipid Panel    Cardiovascular disease        Relevant Orders       Hemoglobin A1c       Basic Metabolic Panel       Hepatic Function Panel       CBC with Differential       Lipid Panel    Type II or unspecified type diabetes mellitus with peripheral circulatory disorders, uncontrolled(250.72)        Relevant Medications       Insulin Lispro Prot & Lispro (HUMALOG MIX 75/25 KWIKPEN) (75-25) 100 UNIT/ML Kwikpen       insulin aspart (NOVOLOG) 100 UNIT/ML injection  Other Relevant Orders       Hemoglobin A1c       Basic Metabolic Panel       Hepatic Function Panel       Ambulatory referral to Endocrinology    Cerumen impaction         Call the office with any questions or concern. Follow-up with specialist as discussed. Increase insulin to 50 units. Exercise daily. Recheck in 3 months.

## 2013-10-25 NOTE — Telephone Encounter (Signed)
Pt called to ask if her handicap paperwork has been filled out

## 2013-10-25 NOTE — Progress Notes (Signed)
Pre visit review using our clinic review tool, if applicable. No additional management support is needed unless otherwise documented below in the visit note. 

## 2013-10-25 NOTE — Patient Instructions (Signed)
Diabetes and Standards of Medical Care  Diabetes is complicated. You may find that your diabetes team includes a dietitian, nurse, diabetes educator, eye doctor, and more. To help everyone know what is going on and to help you get the care you deserve, the following schedule of care was developed to help keep you on track. Below are the tests, exams, vaccines, medicines, education, and plans you will need. HbA1c test This test shows how well you have controlled your glucose over the past 2 3 months. It is used to see if your diabetes management plan needs to be adjusted.   It is performed at least 2 times a year if you are meeting treatment goals.  It is performed 4 times a year if therapy has changed or if you are not meeting treatment goals. Blood pressure test  This test is performed at every routine medical visit. The goal is less than 140/90 mmHg for most people, but 130/80 mmHg in some cases. Ask your health care provider about your goal. Dental exam  Follow up with the dentist regularly. Eye exam  If you are diagnosed with type 1 diabetes as a child, get an exam upon reaching the age of 76 years or older and have had diabetes for 3 5 years. Yearly eye exams are recommended after that initial eye exam.  If you are diagnosed with type 1 diabetes as an adult, get an exam within 5 years of diagnosis and then yearly.  If you are diagnosed with type 2 diabetes, get an exam as soon as possible after the diagnosis and then yearly. Foot care exam  Visual foot exams are performed at every routine medical visit. The exams check for cuts, injuries, or other problems with the feet.  A comprehensive foot exam should be done yearly. This includes visual inspection as well as assessing foot pulses and testing for loss of sensation.  Check your feet nightly for cuts, injuries, or other problems with your feet. Tell your health care provider if anything is not healing. Kidney function test (urine  microalbumin)  This test is performed once a year.  Type 1 diabetes: The first test is performed 5 years after diagnosis.  Type 2 diabetes: The first test is performed at the time of diagnosis.  A serum creatinine and estimated glomerular filtration rate (eGFR) test is done once a year to assess the level of chronic kidney disease (CKD), if present. Lipid profile (cholesterol, HDL, LDL, triglycerides)  Performed every 5 years for most people.  The goal for LDL is less than 100 mg/dL. If you are at high risk, the goal is less than 70 mg/dL.  The goal for HDL is 40 mg/dL 50 mg/dL for men and 50 mg/dL 60 mg/dL for women. An HDL cholesterol of 60 mg/dL or higher gives some protection against heart disease.  The goal for triglycerides is less than 150 mg/dL. Influenza vaccine, pneumococcal vaccine, and hepatitis B vaccine  The influenza vaccine is recommended yearly.  The pneumococcal vaccine is generally given once in a lifetime. However, there are some instances when another vaccination is recommended. Check with your health care provider.  The hepatitis B vaccine is also recommended for adults with diabetes. Diabetes self-management education  Education is recommended at diagnosis and ongoing as needed. Treatment plan  Your treatment plan is reviewed at every medical visit. Document Released: 02/27/2009 Document Revised: 01/02/2013 Document Reviewed: 10/02/2012 [facility] Patient Information 2014 [place].  Diabetes and Exercise Exercising regularly is important. It  is not just about losing weight. It has many health benefits, such as:  Improving your overall fitness, flexibility, and endurance.  Increasing your bone density.  Helping with weight control.  Decreasing your body fat.  Increasing your muscle strength.  Reducing stress and tension.  Improving your overall health. People with diabetes who exercise gain additional benefits because exercise:  Reduces  appetite.  Improves the body's use of blood sugar (glucose).  Helps lower or control blood glucose.  Decreases blood pressure.  Helps control blood lipids (such as cholesterol and triglycerides).  Improves the body's use of the hormone insulin by:  Increasing the body's insulin sensitivity.  Reducing the body's insulin needs.  Decreases the risk for heart disease because exercising:  Lowers cholesterol and triglycerides levels.  Increases the levels of good cholesterol (such as high-density lipoproteins [HDL]) in the body.  Lowers blood glucose levels. YOUR ACTIVITY PLAN  Choose an activity that you enjoy and set realistic goals. Your health care provider or diabetes educator can help you make an activity plan that works for you. You can break activities into 2 or 3 sessions throughout the day. Doing so is as good as one long session. Exercise ideas include:  Taking the dog for a walk.  Taking the stairs instead of the elevator.  Dancing to your favorite song.  Doing your favorite exercise with a friend. RECOMMENDATIONS FOR EXERCISING WITH TYPE 1 OR TYPE 2 DIABETES   Check your blood glucose before exercising. If blood glucose levels are greater than 240 mg/dL, check for urine ketones. Do not exercise if ketones are present.  Avoid injecting insulin into areas of the body that are going to be exercised. For example, avoid injecting insulin into:  The arms when playing tennis.  The legs when jogging.  Keep a record of:  Food intake before and after you exercise.  Expected peak times of insulin action.  Blood glucose levels before and after you exercise.  The type and amount of exercise you have done.  Review your records with your health care provider. Your health care provider will help you to develop guidelines for adjusting food intake and insulin amounts before and after exercising.  If you take insulin or oral hypoglycemic agents, watch for signs and  symptoms of hypoglycemia. They include:  Dizziness.  Shaking.  Sweating.  Chills.  Confusion.  Drink plenty of water while you exercise to prevent dehydration or heat stroke. Body water is lost during exercise and must be replaced.  Talk to your health care provider before starting an exercise program to make sure it is safe for you. Remember, almost any type of activity is better than none. Document Released: 07/23/2003 Document Revised: 01/02/2013 Document Reviewed: 10/09/2012 [facility] Patient Information 2014 [place].

## 2013-10-26 ENCOUNTER — Telehealth: Payer: Self-pay | Admitting: Family

## 2013-10-26 NOTE — Telephone Encounter (Signed)
Relevant patient education assigned to patient using Emmi. ° °

## 2013-10-28 ENCOUNTER — Telehealth: Payer: Self-pay | Admitting: Family

## 2013-10-28 NOTE — Telephone Encounter (Signed)
Pt aware form can be picked up at the office

## 2013-10-28 NOTE — Telephone Encounter (Signed)
[facility] called to request additional info on pt.for referral you sent.  They need 2 yrs of labs and the last 8 ov notes. pls advise

## 2013-10-29 ENCOUNTER — Other Ambulatory Visit: Payer: Self-pay

## 2013-10-29 ENCOUNTER — Other Ambulatory Visit (INDEPENDENT_AMBULATORY_CARE_PROVIDER_SITE_OTHER): Payer: Medicare Other

## 2013-10-29 DIAGNOSIS — J309 Allergic rhinitis, unspecified: Secondary | ICD-10-CM

## 2013-10-29 DIAGNOSIS — R7989 Other specified abnormal findings of blood chemistry: Secondary | ICD-10-CM

## 2013-10-29 DIAGNOSIS — I2510 Atherosclerotic heart disease of native coronary artery without angina pectoris: Secondary | ICD-10-CM

## 2013-10-29 DIAGNOSIS — E7800 Pure hypercholesterolemia, unspecified: Secondary | ICD-10-CM | POA: Diagnosis not present

## 2013-10-29 DIAGNOSIS — E1159 Type 2 diabetes mellitus with other circulatory complications: Secondary | ICD-10-CM | POA: Diagnosis not present

## 2013-10-29 DIAGNOSIS — R945 Abnormal results of liver function studies: Secondary | ICD-10-CM

## 2013-10-29 DIAGNOSIS — Z79899 Other long term (current) drug therapy: Secondary | ICD-10-CM

## 2013-10-29 DIAGNOSIS — I10 Essential (primary) hypertension: Secondary | ICD-10-CM | POA: Diagnosis not present

## 2013-10-29 DIAGNOSIS — N289 Disorder of kidney and ureter, unspecified: Secondary | ICD-10-CM | POA: Diagnosis not present

## 2013-10-29 DIAGNOSIS — R17 Unspecified jaundice: Secondary | ICD-10-CM

## 2013-10-29 LAB — HEPATIC FUNCTION PANEL
ALT: 372 U/L — ABNORMAL HIGH (ref 0–53)
AST: 300 U/L — ABNORMAL HIGH (ref 0–37)
Albumin: 3.7 g/dL (ref 3.5–5.2)
Alkaline Phosphatase: 193 U/L — ABNORMAL HIGH (ref 39–117)
Bilirubin, Direct: 0.2 mg/dL (ref 0.0–0.3)
Total Bilirubin: 0.5 mg/dL (ref 0.2–1.2)
Total Protein: 6.4 g/dL (ref 6.0–8.3)

## 2013-10-29 LAB — CBC WITH DIFFERENTIAL/PLATELET
Basophils Absolute: 0 10*3/uL (ref 0.0–0.1)
Basophils Relative: 0.3 % (ref 0.0–3.0)
Eosinophils Absolute: 0.1 10*3/uL (ref 0.0–0.7)
Eosinophils Relative: 1.3 % (ref 0.0–5.0)
HCT: 38.5 % — ABNORMAL LOW (ref 39.0–52.0)
Hemoglobin: 12.8 g/dL — ABNORMAL LOW (ref 13.0–17.0)
Lymphocytes Relative: 15 % (ref 12.0–46.0)
Lymphs Abs: 1.2 10*3/uL (ref 0.7–4.0)
MCHC: 33.4 g/dL (ref 30.0–36.0)
MCV: 90.9 fl (ref 78.0–100.0)
Monocytes Absolute: 0.7 10*3/uL (ref 0.1–1.0)
Monocytes Relative: 9.6 % (ref 3.0–12.0)
Neutro Abs: 5.7 10*3/uL (ref 1.4–7.7)
Neutrophils Relative %: 73.8 % (ref 43.0–77.0)
Platelets: 177 10*3/uL (ref 150.0–400.0)
RBC: 4.23 Mil/uL (ref 4.22–5.81)
RDW: 14.9 % (ref 11.5–15.5)
WBC: 7.7 10*3/uL (ref 4.0–10.5)

## 2013-10-29 LAB — BASIC METABOLIC PANEL
BUN: 38 mg/dL — ABNORMAL HIGH (ref 6–23)
CO2: 28 mEq/L (ref 19–32)
Calcium: 9.4 mg/dL (ref 8.4–10.5)
Chloride: 103 mEq/L (ref 96–112)
Creatinine, Ser: 1.9 mg/dL — ABNORMAL HIGH (ref 0.4–1.5)
GFR: 37.32 mL/min — ABNORMAL LOW (ref >60.00)
Glucose, Bld: 323 mg/dL — ABNORMAL HIGH (ref 70–99)
Potassium: 4.7 mEq/L (ref 3.5–5.1)
Sodium: 137 mEq/L (ref 135–145)

## 2013-10-29 LAB — LIPID PANEL
Cholesterol: 121 mg/dL (ref 0–200)
HDL: 27.6 mg/dL — ABNORMAL LOW (ref >39.00)
LDL Cholesterol: 49 mg/dL (ref 0–99)
NonHDL: 93.4
Total CHOL/HDL Ratio: 4
Triglycerides: 221 mg/dL — ABNORMAL HIGH (ref 0.0–149.0)
VLDL: 44.2 mg/dL — ABNORMAL HIGH (ref 0.0–40.0)

## 2013-10-29 LAB — HEMOGLOBIN A1C: Hgb A1c MFr Bld: 11.8 % — ABNORMAL HIGH (ref 4.6–6.5)

## 2013-10-30 ENCOUNTER — Encounter: Payer: Self-pay | Admitting: Internal Medicine

## 2013-10-30 ENCOUNTER — Ambulatory Visit (INDEPENDENT_AMBULATORY_CARE_PROVIDER_SITE_OTHER): Payer: Medicare Other | Admitting: Internal Medicine

## 2013-10-30 VITALS — BP 126/55 | HR 76 | Ht 76.0 in | Wt 269.0 lb

## 2013-10-30 DIAGNOSIS — I495 Sick sinus syndrome: Secondary | ICD-10-CM

## 2013-10-30 DIAGNOSIS — R0609 Other forms of dyspnea: Secondary | ICD-10-CM

## 2013-10-30 DIAGNOSIS — R0600 Dyspnea, unspecified: Secondary | ICD-10-CM

## 2013-10-30 DIAGNOSIS — R0989 Other specified symptoms and signs involving the circulatory and respiratory systems: Secondary | ICD-10-CM

## 2013-10-30 DIAGNOSIS — Z950 Presence of cardiac pacemaker: Secondary | ICD-10-CM

## 2013-10-30 DIAGNOSIS — I498 Other specified cardiac arrhythmias: Secondary | ICD-10-CM | POA: Diagnosis not present

## 2013-10-30 DIAGNOSIS — R001 Bradycardia, unspecified: Secondary | ICD-10-CM

## 2013-10-30 LAB — MDC_IDC_ENUM_SESS_TYPE_INCLINIC
Battery Impedance: 2451 Ohm
Battery Remaining Longevity: 20 mo
Battery Voltage: 2.71 V
Brady Statistic AP VP Percent: 25 %
Brady Statistic AP VS Percent: 75 %
Brady Statistic AS VP Percent: 0 %
Brady Statistic AS VS Percent: 0 %
Date Time Interrogation Session: 20150617135922
Lead Channel Impedance Value: 470 Ohm
Lead Channel Impedance Value: 618 Ohm
Lead Channel Pacing Threshold Amplitude: 1 V
Lead Channel Pacing Threshold Amplitude: 1.25 V
Lead Channel Pacing Threshold Pulse Width: 0.4 ms
Lead Channel Pacing Threshold Pulse Width: 1 ms
Lead Channel Sensing Intrinsic Amplitude: 22.4 mV
Lead Channel Setting Pacing Amplitude: 2 V
Lead Channel Setting Pacing Amplitude: 2.5 V
Lead Channel Setting Pacing Pulse Width: 0.4 ms
Lead Channel Setting Sensing Sensitivity: 5.6 mV

## 2013-10-30 NOTE — Patient Instructions (Addendum)
Your physician has recommended you make the following change in your medication:  1) STOP Amlodipine  Your physician recommends that you schedule a follow-up appointment in: 3 months with Dr. Caryl Comes.

## 2013-10-30 NOTE — Progress Notes (Signed)
Patient Care Team: Timoteo Gaul, FNP as PCP - General (Family Medicine)   HPI  David [name] is a 73 y.o. male Seen in followup for pacemaker implanted for sinus node dysfunction and dyspnea on exertion.  He continues with peripheral edema  Echocardiogram 6/15 normal LV function and severe left ventricular hypertrophy with left atrial enlargement consistent with hypertensive heart disease  Past Medical History  Diagnosis Date  . Diabetes   . Hypertension   . CAD (coronary artery disease)   . Renal insufficiency   . Sleep apnea   . Palpitations   . Dyspnea   . Sinus bradycardia   . Obesity   . Allergy   . Kidney failure   . Arthritis   . Diabetes   . Anxiety   . Cancer     skin    Past Surgical History  Procedure Laterality Date  . Insert / replace / remove pacemaker    . Knee surgery    . Vein ligation and stripping    . 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 amLODipine (NORVASC) 10 MG tablet Take 10 mg by mouth daily.      Marland Kitchen aspirin 81 MG tablet Take 81 mg by mouth daily.      Marland Kitchen atorvastatin (LIPITOR) 20 MG tablet Take 20 mg by mouth daily.      . B-D ULTRAFINE III SHORT PEN 31G X 8 MM MISC       . carvedilol (COREG) 12.5 MG tablet Take 12.5 mg by mouth 2 (two) times daily with a meal.      . carvedilol (COREG) 25 MG tablet Take 25 mg by mouth 2 (two) times daily with a meal.      . doxazosin (CARDURA) 2 MG tablet Take 2 mg by mouth daily.      Marland Kitchen gemfibrozil (LOPID) 600 MG tablet Take 600 mg by mouth 2 (two) times daily before a meal.      . glipiZIDE (GLUCOTROL) 10 MG tablet Take 10 mg by mouth 2 (two) times daily before a meal.      . insulin aspart (NOVOLOG) 100 UNIT/ML injection Inject 20 Units into the skin 2 (two) times daily. As directed  30 mL  0  . Insulin Lispro Prot & Lispro (HUMALOG MIX 75/25 KWIKPEN) (75-25) 100 UNIT/ML Kwikpen Inject 40 Units into the  skin 2 (two) times daily.  15 mL  0  . isosorbide mononitrate (IMDUR) 30 MG 24 hr tablet Take 2 tablets (60 mg total) by mouth daily.  60 tablet  1  . Loratadine 10 MG CAPS Take by mouth daily.      . Omega-3 Fatty Acids (FISH OIL) 1000 MG CAPS Take by mouth 2 (two) times daily.      . pantoprazole (PROTONIX) 40 MG tablet Take 40 mg by mouth daily.       Marland Kitchen torsemide (DEMADEX) 20 MG tablet Take 2 tablets (40 mg total) by mouth daily.  7 tablet  0   No current facility-administered medications for this visit.    No Known Allergies  Review of Systems negative except from HPI and PMH  Physical Exam BP 126/55  Pulse 76  Ht 6\' 4"  (1.93 m)  Wt 269 lb (122.018 kg)  BMI 32.76 kg/m2 Well developed and well nourished in no acute distress HENT normal E scleral and icterus clear  Neck Supple JVP flat; carotids brisk and full Clear to ausculation  Regular rate and rhythm, no murmurs gallops or rub Soft with active bowel sounds No clubbing cyanosis 1+ Edema Alert and oriented, grossly normal motor and sensory function Skin Warm and Dry    Assessment and  Plan Sinus node dysfunction  Renal dysfunction  Hypertension  Sleep apnea-treated  Will discontinue the amlodipine He is to see [name] in just a few days   At that time, reassessment of BP will inform needs for further meds, and hopefully some of his edema will have resolved His labs suggest prerenal  Azotemia but he still might benefit from mild increase in diuretics.  His Hgb was pretty good, but stool quiac may be worth checking as this might also explain the increased BUN

## 2013-10-31 ENCOUNTER — Other Ambulatory Visit: Payer: Medicare Other

## 2013-10-31 DIAGNOSIS — R17 Unspecified jaundice: Secondary | ICD-10-CM | POA: Diagnosis not present

## 2013-11-01 ENCOUNTER — Encounter: Payer: Self-pay | Admitting: Endocrinology

## 2013-11-01 ENCOUNTER — Other Ambulatory Visit: Payer: Self-pay

## 2013-11-01 ENCOUNTER — Other Ambulatory Visit: Payer: Self-pay | Admitting: *Deleted

## 2013-11-01 ENCOUNTER — Ambulatory Visit (INDEPENDENT_AMBULATORY_CARE_PROVIDER_SITE_OTHER): Payer: Medicare Other | Admitting: Endocrinology

## 2013-11-01 VITALS — BP 122/60 | HR 62 | Temp 98.1°F | Resp 16 | Ht 76.0 in | Wt 271.4 lb

## 2013-11-01 DIAGNOSIS — R945 Abnormal results of liver function studies: Secondary | ICD-10-CM

## 2013-11-01 DIAGNOSIS — R5381 Other malaise: Secondary | ICD-10-CM

## 2013-11-01 DIAGNOSIS — E1142 Type 2 diabetes mellitus with diabetic polyneuropathy: Secondary | ICD-10-CM

## 2013-11-01 DIAGNOSIS — R5383 Other fatigue: Secondary | ICD-10-CM | POA: Diagnosis not present

## 2013-11-01 DIAGNOSIS — E1149 Type 2 diabetes mellitus with other diabetic neurological complication: Secondary | ICD-10-CM

## 2013-11-01 DIAGNOSIS — E1159 Type 2 diabetes mellitus with other circulatory complications: Secondary | ICD-10-CM

## 2013-11-01 DIAGNOSIS — I2510 Atherosclerotic heart disease of native coronary artery without angina pectoris: Secondary | ICD-10-CM | POA: Diagnosis not present

## 2013-11-01 DIAGNOSIS — R7989 Other specified abnormal findings of blood chemistry: Secondary | ICD-10-CM

## 2013-11-01 LAB — HEPATITIS PANEL, ACUTE
HCV Ab: NEGATIVE
Hep A IgM: NONREACTIVE
Hep B C IgM: NONREACTIVE
Hepatitis B Surface Ag: NEGATIVE

## 2013-11-01 MED ORDER — VICTOZA 18 MG/3ML ~~LOC~~ SOPN: 1.2000 mg | PEN_INJECTOR | Freq: Every day | SUBCUTANEOUS | Status: DC

## 2013-11-01 MED ORDER — GLUCOSE BLOOD VI DISK: DISK | Status: DC

## 2013-11-01 NOTE — Progress Notes (Signed)
Patient ID: David [name], male   DOB: Jan 18, 1941, 73 y.o.   MRN: [id number]    Reason for Appointment: Consultation for Type 2 Diabetes  Referring physician: Megan Salon  History of Present Illness:          Diagnosis: Type 2 diabetes mellitus, date of diagnosis:   1992       Past history:  He was initially treated with metformin and at some point also glipizide. His previous records from out of town are not available and no information is available about his level of control Apparently he was started on insulin in 1994 approximately because of poor control He has been on various insulin regimens over the last several years However even with insulin he has had poor control for at least the last 7 or 8 years. He does not know what his previous A1c levels have been. He had been continued on metformin and glipizide but metformin stopped about 2 years ago because of kidney function abnormality He had been taking Lantus 60 units twice a day with NovoLog until about 4 months ago  Recent history:  In the last 4-5 months he has been on NovoLog twice a day and Humalog mix insulin twice a day before meals from a nurse practitioner before he moved here He is not taking any oral hypoglycemic drugs except glipizide Does not think he has ever tried any Byetta, Actos or Victoza He thinks his blood sugars are higher with using premixed insulin but did not bring any records for review He occasionally has blood sugars over 400 also Currently he thinks his blood sugars are relatively higher in the morning, sometimes lower midmorning and slightly better before supper. Often not checking readings after meals He tends to feel a little hypoglycemic in blood sugar is near normal especially after exercise in the morning. He will then eat a half peanut butter sandwich He does feel increased thirst and urination and fatigue but does not drink any drinks with sugar       Oral hypoglycemic drugs the  patient is taking are:   glipizide 10 mg twice a day     INSULIN regimen is described as:  Novolog ac bid 20; Humolog Mix 42 ac bid  Glucose monitoring:  done 2 times a day         Glucometer:  contour     Blood Glucose readings from recall: A.m. 300-400, evening about 300 Lower after breakfast or exercise in the morning, rarely below 100  Hypoglycemia: None      Glycemic control:  Lab Results  Component Value Date   HGBA1C 11.8* 10/29/2013   Lab Results  Component Value Date   LDLCALC 49 10/29/2013   CREATININE 1.9* 10/29/2013    Self-care: The diet that the patient has been following is: tries to limit fat intake but tends to have more snacks     Meals: 3 meals per day.breakfast is either cereal or eggs and toast, lunch is relatively lighter           Exercise:  exercise bike usually daily 15-30 min         Dietician visit: Most recent: several years ago.               Compliance with the medical regimen: good Retinal exam: Most recent: 05/2013.    Weight history: Coming down  Wt Readings from Last 3 Encounters:  11/01/13 271 lb 6.4 oz (123.106 kg)  10/30/13 269 lb (122.018  kg)  10/25/13 275 lb (124.739 kg)      Medication List       This list is accurate as of: 11/01/13 12:44 PM.  Always use your most recent med list.               aspirin 81 MG tablet  Take 81 mg by mouth daily.     atorvastatin 20 MG tablet  Commonly known as:  LIPITOR  Take 20 mg by mouth daily.     B-D ULTRAFINE III SHORT PEN 31G X 8 MM Misc  Generic drug:  Insulin Pen Needle     carvedilol 25 MG tablet  Commonly known as:  COREG  Take 25 mg by mouth 2 (two) times daily with a meal.     carvedilol 12.5 MG tablet  Commonly known as:  COREG  Take 12.5 mg by mouth 2 (two) times daily with a meal.     doxazosin 2 MG tablet  Commonly known as:  CARDURA  Take 2 mg by mouth daily.     Fish Oil 1000 MG Caps  Take by mouth 2 (two) times daily.     gemfibrozil 600 MG tablet  Commonly  known as:  LOPID  Take 600 mg by mouth 2 (two) times daily before a meal.     glipiZIDE 10 MG tablet  Commonly known as:  GLUCOTROL  Take 10 mg by mouth 2 (two) times daily before a meal.     Glucose Blood Disk  Commonly known as:  BAYER BREEZE 2 TEST  Checks 3 times per day dx code 250.72     insulin aspart 100 UNIT/ML injection  Commonly known as:  NOVOLOG  Inject 20 Units into the skin 2 (two) times daily. As directed     Insulin Lispro Prot & Lispro (75-25) 100 UNIT/ML Kwikpen  Commonly known as:  HUMALOG 75/25 MIX  Inject 42 Units into the skin 2 (two) times daily.     isosorbide mononitrate 30 MG 24 hr tablet  Commonly known as:  IMDUR  Take 2 tablets (60 mg total) by mouth daily.     Loratadine 10 MG Caps  Take by mouth daily.     pantoprazole 40 MG tablet  Commonly known as:  PROTONIX  Take 40 mg by mouth daily.     torsemide 20 MG tablet  Commonly known as:  DEMADEX  Take 2 tablets (40 mg total) by mouth daily.     VICTOZA 18 MG/3ML Sopn  Generic drug:  Liraglutide  Inject 1.2 mg into the skin daily. Inject once daily at the same time        Allergies: No Known Allergies  Past Medical History  Diagnosis Date  . Diabetes   . Hypertension   . CAD (coronary artery disease)   . Renal insufficiency   . Sleep apnea   . Palpitations   . Dyspnea   . Sinus bradycardia   . Obesity   . Allergy   . Kidney failure   . Arthritis   . Diabetes   . Anxiety   . Cancer     skin    Past Surgical History  Procedure Laterality Date  . Insert / replace / remove pacemaker    . Knee surgery    . Vein ligation and stripping    . 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 Cancer  Mother     breast  . Heart attack Father     Social History:  reports that he has quit smoking. He does not have any smokeless tobacco history on file. His alcohol and drug histories are not on file.    Review of Systems         Lipids: Started on Lipitor 7-8 months ago. Previously has taken Gembrozil for several years without apparent side effects. Baseline LDL level not available       Lab Results  Component Value Date   CHOL 121 10/29/2013   HDL 27.60* 10/29/2013   LDLCALC 49 10/29/2013   TRIG 221.0* 10/29/2013   CHOLHDL 4 10/29/2013       No unusual headaches.                  Skin: No rash or infections     Thyroid:  No  unusual fatigue or cold intolerance. No history of thyroid disease.     The blood pressure has been treated with various drugs. Previously on lisinopril and this is possibly stop by nephrologist For unknown reason he is taking 2 separate prescriptions of Coreg, 25 and 12.5 mg      Has had swelling of feet, recently treated with Demadex      Gets shortness of breath on exertion.     Bowel habits: Normal.      No joint  pains.      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 Hepatitis B Surface Ag 10/31/2013 NEGATIVE  NEGATIVE Final  . HCV Ab 10/31/2013 NEGATIVE  NEGATIVE Final  . Hep B C IgM 10/31/2013 NON REACTIVE  NON REACTIVE Final   Comment: High levels of Hepatitis B Core IgM antibody are detectable                          during the acute stage of Hepatitis B. This antibody is used                          to differentiate current from past HBV infection.                             . Hep A IgM 10/31/2013 NON REACTIVE  NON REACTIVE Final  Lab on 10/29/2013  Component Date Value Ref Range Status  . Hemoglobin A1C 10/29/2013 11.8* 4.6 - 6.5 % Final   Glycemic Control Guidelines for People with Diabetes:Non Diabetic:  <6%Goal of Therapy: <7%Additional Action Suggested:  >8%   . Sodium 10/29/2013 137  135 - 145 mEq/L Final  . Potassium 10/29/2013  4.7  3.5 - 5.1 mEq/L Final  . Chloride 10/29/2013 103  96 - 112 mEq/L Final  . CO2 10/29/2013 28  19 - 32 mEq/L Final  . Glucose, Bld 10/29/2013 323* 70 - 99 mg/dL Final  . BUN 10/29/2013 38* 6 - 23 mg/dL Final  . Creatinine, Ser 10/29/2013 1.9* 0.4 - 1.5 mg/dL Final  . Calcium 10/29/2013 9.4  8.4 - 10.5 mg/dL Final  . GFR 10/29/2013  37.32* >60.00 mL/min Final  . Total Bilirubin 10/29/2013 0.5  0.2 - 1.2 mg/dL Final  . Bilirubin, Direct 10/29/2013 0.2  0.0 - 0.3 mg/dL Final  . Alkaline Phosphatase 10/29/2013 193* 39 - 117 U/L Final  . AST 10/29/2013 300* 0 - 37 U/L Final  . ALT 10/29/2013 372* 0 - 53 U/L Final  . Total Protein 10/29/2013 6.4  6.0 - 8.3 g/dL Final  . Albumin 10/29/2013 3.7  3.5 - 5.2 g/dL Final  . WBC 10/29/2013 7.7  4.0 - 10.5 K/uL Final  . RBC 10/29/2013 4.23  4.22 - 5.81 Mil/uL Final  . Hemoglobin 10/29/2013 12.8* 13.0 - 17.0 g/dL Final  . HCT 10/29/2013 38.5* 39.0 - 52.0 % Final  . MCV 10/29/2013 90.9  78.0 - 100.0 fl Final  . MCHC 10/29/2013 33.4  30.0 - 36.0 g/dL Final  . RDW 10/29/2013 14.9  11.5 - 15.5 % Final  . Platelets 10/29/2013 177.0  150.0 - 400.0 K/uL Final  . Neutrophils Relative % 10/29/2013 73.8  43.0 - 77.0 % Final  . Lymphocytes Relative 10/29/2013 15.0  12.0 - 46.0 % Final  . Monocytes Relative 10/29/2013 9.6  3.0 - 12.0 % Final  . Eosinophils Relative 10/29/2013 1.3  0.0 - 5.0 % Final  . Basophils Relative 10/29/2013 0.3  0.0 - 3.0 % Final  . Neutro Abs 10/29/2013 5.7  1.4 - 7.7 K/uL Final  . Lymphs Abs 10/29/2013 1.2  0.7 - 4.0 K/uL Final  . Monocytes Absolute 10/29/2013 0.7  0.1 - 1.0 K/uL Final  . Eosinophils Absolute 10/29/2013 0.1  0.0 - 0.7 K/uL Final  . Basophils Absolute 10/29/2013 0.0  0.0 - 0.1 K/uL Final  . Cholesterol 10/29/2013 121  0 - 200 mg/dL Final   ATP III Classification       Desirable:  < 200 mg/dL               Borderline High:  200 - 239 mg/dL          High:  > = 240 mg/dL  . Triglycerides 10/29/2013 221.0* 0.0 - 149.0  mg/dL Final   Normal:  <150 mg/dLBorderline High:  150 - 199 mg/dL  . HDL 10/29/2013 27.60* >39.00 mg/dL Final  . VLDL 10/29/2013 44.2* 0.0 - 40.0 mg/dL Final  . LDL Cholesterol 10/29/2013 49  0 - 99 mg/dL Final  . Total CHOL/HDL Ratio 10/29/2013 4   Final                  Men          Women1/2 Average Risk     3.4          3.3Average Risk          5.0          4.42X Average Risk          9.6          7.13X Average Risk          15.0          11.0                      . NonHDL 10/29/2013 93.40   Final    Physical Examination:  BP 122/60  Pulse 62  Temp(Src) 98.1 F (36.7 C)  Resp 16  Ht 6\' 4"  (1.93 m)  Wt 271 lb 6.4 oz (123.106 kg)  BMI 33.05 kg/m2  SpO2 95%  GENERAL:  Patient has generalized obesity.  face appears slightly puffy  HEENT:         Eye exam shows normal external appearance. Fundus exam shows no retinopathy.  Oral exam shows normal mucosa .  NECK:         General:  Neck exam shows no lymphadenopathy. Carotids are normal to palpation and no bruit heard. Thyroid is not enlarged and no nodules felt.   LUNGS:         Chest is symmetrical. Lungs are clear to auscultation.Marland Kitchen   HEART:         Heart sounds:  S1 and S2 are normal. No murmurs or clicks heard., no S3 or S4.   ABDOMEN:  marked abdominal obesity present. There is no distention present. Liver and spleen are not palpable. No other mass or tenderness present.  EXTREMITIES:    see below  NEUROLOGICAL:   Vibration sense is absent in toes. Ankle jerks are absent bilaterally.          Diabetic foot exam:  No callus formation. Mild denudation of skin on left second toe superiorly Amputation of right third toe. Thickening and dryness of skin on the toes on the right Absent monofilament sensation in the feet Absent pedal pulses 2+ pedal edema, more on the right MUSCULOSKELETAL:       There is no enlargement or deformity of the joints. Spine is normal to inspection.Marland Kitchen   SKIN:       No rash or lesions of concern.         ASSESSMENT:  Diabetes type 2, uncontrolled with obesity. His blood sugars are significantly elevated now even with taking about 100 units of insulin a day indicating insulin resistance   Recent A1c is 7.8% Unable to take metformin because of renal insufficiency Since his fasting readings are often over 300 he does not seem to be getting much coverage of his overnight blood sugars and overall needs higher basal insulin delivery He does not want to change his Humalog mix as yet as he has a full box of insulin pens Would prefer that he switch to Lantus and NovoLog Will for simplicity just increased the dose of Humalog mix; also to avoid midmorning hypoglycemia he can switch his morning NovoLog to lunchtime when he is not getting any coverage He is not benefiting from glipizide at this stage and he can stop this  He will benefit from using a GLP-1 drug like Victoza especially with his obesity and difficulty losing weight; also tends to not control his overall caloric intake and snacks. Discussed with the patient the nature of GLP-1 drugs, the action on various organ systems, how they benefit blood glucose control, as well as the benefit of weight loss and  increase satiety . Explained possible side effects especially nausea and vomiting; discussed safety information in package insert. Described injection technique and dosage titration of Victoza  starting with 0.6 mg once a day at the same time for the first week and then increasing to 1.2 mg if no symptoms of nausea. 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 Since he has renal insufficiency will reduce his Coreg which he apparently is taking 37.5 mg twice a day; he can continue the 25 mg twice a day dosage  HYPERLIPIDEMIA:  He still has relatively low HDL  and this may improve with better diabetes control and weight loss However since he has a potential drug interaction of gemfibrozil and Lipitor he will stop the gemfibrozil  LIVER function abnormality: He has marked increase in ALT and AST and less so of alkaline phosphatase indicating possible drug effect. His most recent medication is Lipitor and he can stop this for now Would recommend a gastroenterology evaluation if not improved in 2 weeks  Fatigue and edema: Will check TSH for next visit  PLAN:    Patient Instructions  Start VICTOZA injection with the pen once daily at the same time of the day.  Dial the dose to 0.6 mg for the first week.  You may possibly experience nausea in the first few days which usually gets better in a couple of days After 1 week increase the dose to 1.2mg  daily if no nausea present.  You may inject in the stomach, thigh or arm.   You will feel fullness of the stomach with starting the medication and should try to keep portions of food small.  Call us or the [place] helpline at [phone] or visit [email or web address] for any questions  Stop Lipitor, Gemfibrzil and Glipizide  Humalog Mix insulin 60 units before Bfst and supper  Novolog 20 units before lunch  [facility] for foot care   Total visit time including counseling = 60 minutes   Jovian Lembcke 11/01/2013, 12:44 PM   Note: This office note was prepared with Estate agent. Any transcriptional errors that result from this process are unintentional.

## 2013-11-01 NOTE — Patient Instructions (Addendum)
Start VICTOZA injection with the pen once daily at the same time of the day.  Dial the dose to 0.6 mg for the first week.  You may possibly experience nausea in the first few days which usually gets better in a couple of days After 1 week increase the dose to 1.2mg  daily if no nausea present.  You may inject in the stomach, thigh or arm.   You will feel fullness of the stomach with starting the medication and should try to keep portions of food small.  Call us or the [place] helpline at [phone] or visit [email or web address] for any questions  Stop Lipitor, Gemfibrzil and Glipizide  Humalog Mix insulin 60 units before Bfst and supper  Novolog 20 units before lunch  [facility] for foot care

## 2013-11-06 ENCOUNTER — Encounter: Payer: Self-pay | Admitting: Internal Medicine

## 2013-11-20 ENCOUNTER — Encounter: Payer: Self-pay | Admitting: Endocrinology

## 2013-11-20 ENCOUNTER — Ambulatory Visit (INDEPENDENT_AMBULATORY_CARE_PROVIDER_SITE_OTHER): Payer: Medicare Other | Admitting: Endocrinology

## 2013-11-20 ENCOUNTER — Other Ambulatory Visit: Payer: Self-pay | Admitting: *Deleted

## 2013-11-20 VITALS — BP 177/72 | HR 75 | Temp 98.0°F | Resp 16 | Ht 76.0 in | Wt 264.2 lb

## 2013-11-20 DIAGNOSIS — R5381 Other malaise: Secondary | ICD-10-CM | POA: Diagnosis not present

## 2013-11-20 DIAGNOSIS — E1159 Type 2 diabetes mellitus with other circulatory complications: Secondary | ICD-10-CM

## 2013-11-20 DIAGNOSIS — I2510 Atherosclerotic heart disease of native coronary artery without angina pectoris: Secondary | ICD-10-CM

## 2013-11-20 DIAGNOSIS — R5383 Other fatigue: Secondary | ICD-10-CM

## 2013-11-20 LAB — COMPREHENSIVE METABOLIC PANEL
ALT: 55 U/L — ABNORMAL HIGH (ref 0–53)
AST: 35 U/L (ref 0–37)
Albumin: 3.6 g/dL (ref 3.5–5.2)
Alkaline Phosphatase: 127 U/L — ABNORMAL HIGH (ref 39–117)
BUN: 46 mg/dL — ABNORMAL HIGH (ref 6–23)
CO2: 25 mEq/L (ref 19–32)
Calcium: 9.6 mg/dL (ref 8.4–10.5)
Chloride: 101 mEq/L (ref 96–112)
Creatinine, Ser: 1.8 mg/dL — ABNORMAL HIGH (ref 0.4–1.5)
GFR: 40.78 mL/min — ABNORMAL LOW (ref >60.00)
Glucose, Bld: 302 mg/dL — ABNORMAL HIGH (ref 70–99)
Potassium: 4.8 mEq/L (ref 3.5–5.1)
Sodium: 135 mEq/L (ref 135–145)
Total Bilirubin: 0.6 mg/dL (ref 0.2–1.2)
Total Protein: 6.5 g/dL (ref 6.0–8.3)

## 2013-11-20 LAB — URINALYSIS, ROUTINE W REFLEX MICROSCOPIC
Bilirubin Urine: NEGATIVE
Ketones, ur: NEGATIVE
Leukocytes, UA: NEGATIVE
Nitrite: NEGATIVE
Specific Gravity, Urine: 1.015 (ref 1.000–1.030)
Total Protein, Urine: 30 — AB
Urine Glucose: 250 — AB
Urobilinogen, UA: 0.2 (ref 0.0–1.0)
pH: 5.5 (ref 5.0–8.0)

## 2013-11-20 LAB — MICROALBUMIN / CREATININE URINE RATIO
Creatinine,U: 68.9 mg/dL
Microalb Creat Ratio: 27.3 mg/g (ref 0.0–30.0)
Microalb, Ur: 18.8 mg/dL — ABNORMAL HIGH (ref 0.0–1.9)

## 2013-11-20 LAB — TSH: TSH: 1.68 u[IU]/mL (ref 0.35–4.50)

## 2013-11-20 MED ORDER — INSULIN GLARGINE 100 UNIT/ML SOLOSTAR PEN: 60.0000 [IU] | PEN_INJECTOR | Freq: Every day | SUBCUTANEOUS | Status: DC

## 2013-11-20 MED ORDER — GLUCOSE BLOOD VI DISK: DISK | Status: DC

## 2013-11-20 NOTE — Progress Notes (Signed)
Patient ID: David [name], male   DOB: March 15, 1941, 73 y.o.   MRN: [id number]    Reason for Appointment: Followup for Type 2 Diabetes  Referring physician: Megan Salon  History of Present Illness:          Diagnosis: Type 2 diabetes mellitus, date of diagnosis:   1992       Past history:  He was initially treated with metformin and at some point also glipizide. His previous records from out of town are not available and no information is available about his level of control Apparently he was started on insulin in 1994 approximately because of poor control He has been on various insulin regimens over the last several years However even with insulin he has had poor control for at least the last 7 or 8 years. He does not know what his previous A1c levels have been. He had been continued on metformin and glipizide but metformin stopped about 2 years ago because of kidney function abnormality He had been taking Lantus 60 units twice a day with NovoLog until about 4 months ago  Recent history:  Before his initial consultation in 6/13 he was on NovoLog twice a day and Humalog mix insulin twice a day before meals With this he was having poor control His premixed insulin was increased to 60 units from 42 units and he was started on NovoLog at lunch; glipizide was stopped Also was started on Victoza; he was told to titrate this to 1.2 mg but is taking only 0.6 even though he had no nausea His blood sugars however are about the same He is checking his blood sugars mostly mid day       Oral hypoglycemic drugs the patient is taking are: None       INSULIN regimen is described as:  Novolog ac bid 20; Humolog Mix 60 ac bid  Glucose monitoring:  done 1 times a day         Glucometer:  contour     Blood Glucose readings from download show:  PREMEAL  10-11 AM  1PM-3PM  Dinner Bedtime Overall  Glucose range:  355-517   165-600+  ?   178-352    Mean/median:   384     379    Hypoglycemia:  None      Glycemic control:  Lab Results  Component Value Date   HGBA1C 11.8* 10/29/2013   Lab Results  Component Value Date   MICROALBUR 18.8* 11/20/2013   LDLCALC 49 10/29/2013   CREATININE 1.8* 11/20/2013    Self-care: The diet that the patient has been following is: tries to limit fat intake but tends to have more snacks     Meals: 3 meals per day.breakfast is either cereal or eggs and toast, lunch is relatively lighter           Exercise:  exercise bike usually daily 15-30 min; some walking        Dietician visit: Most recent: several years ago.               Compliance with the medical regimen: good Retinal exam: Most recent: 05/2013.    Weight history:  Wt Readings from Last 3 Encounters:  11/20/13 264 lb 3.2 oz (119.84 kg)  11/01/13 271 lb 6.4 oz (123.106 kg)  10/30/13 269 lb (122.018 kg)      Medication List       This list is accurate as of: 11/20/13 11:59 PM.  Always use your  most recent med list.               aspirin 81 MG tablet  Take 81 mg by mouth daily.     atorvastatin 20 MG tablet  Commonly known as:  LIPITOR  Take 20 mg by mouth daily.     B-D ULTRAFINE III SHORT PEN 31G X 8 MM Misc  Generic drug:  Insulin Pen Needle     carvedilol 25 MG tablet  Commonly known as:  COREG  Take 25 mg by mouth 2 (two) times daily with a meal.     carvedilol 12.5 MG tablet  Commonly known as:  COREG  Take 12.5 mg by mouth 2 (two) times daily with a meal.     doxazosin 2 MG tablet  Commonly known as:  CARDURA  Take 2 mg by mouth daily.     Fish Oil 1000 MG Caps  Take by mouth 2 (two) times daily.     gemfibrozil 600 MG tablet  Commonly known as:  LOPID  Take 600 mg by mouth daily.     glipiZIDE 10 MG tablet  Commonly known as:  GLUCOTROL  Take 10 mg by mouth 2 (two) times daily before a meal.     Glucose Blood Disk  Commonly known as:  BAYER BREEZE 2 TEST  Checks 3 times per day dx code 250.72     insulin aspart 100 UNIT/ML injection  Commonly known  as:  NOVOLOG  Inject 20 Units into the skin 2 (two) times daily. As directed     Insulin Glargine 100 UNIT/ML Solostar Pen  Commonly known as:  LANTUS SOLOSTAR  Inject 60 Units into the skin daily at 10 pm.     Insulin Lispro Prot & Lispro (75-25) 100 UNIT/ML Kwikpen  Commonly known as:  HUMALOG 75/25 MIX  Inject 42 Units into the skin 2 (two) times daily.     isosorbide mononitrate 30 MG 24 hr tablet  Commonly known as:  IMDUR  Take 2 tablets (60 mg total) by mouth daily.     Loratadine 10 MG Caps  Take by mouth daily.     pantoprazole 40 MG tablet  Commonly known as:  PROTONIX  Take 40 mg by mouth daily.     torsemide 20 MG tablet  Commonly known as:  DEMADEX  Take 2 tablets (40 mg total) by mouth daily.     VICTOZA 18 MG/3ML Sopn  Generic drug:  Liraglutide  Inject 0.6 mg into the skin daily. Inject once daily at the same time        Allergies: No Known Allergies  Past Medical History  Diagnosis Date  . Diabetes   . Hypertension   . CAD (coronary artery disease)   . Renal insufficiency   . Sleep apnea   . Palpitations   . Dyspnea   . Sinus bradycardia   . Obesity   . Allergy   . Kidney failure   . Arthritis   . Diabetes   . Anxiety   . Cancer     skin    Past Surgical History  Procedure Laterality Date  . Insert / replace / remove pacemaker    . Knee surgery    . Vein ligation and stripping    . 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 Cancer Mother     breast  . Heart attack Father  Social History:  reports that he has quit smoking. He does not have any smokeless tobacco history on file. His alcohol and drug histories are not on file.    Review of Systems       Lipids: Had been on a Lipitor since late 2014. Also has taken Gembrozil for several years without apparent side effects.  Both of these were recently stopped because of increased liver functions       Lab Results    Component Value Date   CHOL 121 10/29/2013   HDL 27.60* 10/29/2013   LDLCALC 49 10/29/2013   TRIG 221.0* 10/29/2013   CHOLHDL 4 10/29/2013        The blood pressure has been treated with various drugs. 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 Sodium 11/20/2013 135  135 - 145 mEq/L Final  . Potassium 11/20/2013 4.8  3.5 - 5.1 mEq/L Final  . Chloride 11/20/2013 101  96 - 112 mEq/L Final  . CO2 11/20/2013 25  19 - 32 mEq/L Final  . Glucose, Bld 11/20/2013 302* 70 - 99 mg/dL Final  . BUN 11/20/2013 46* 6 - 23 mg/dL Final  . Creatinine, Ser 11/20/2013 1.8* 0.4 - 1.5 mg/dL Final  . Total Bilirubin 11/20/2013 0.6  0.2 - 1.2 mg/dL Final  . Alkaline Phosphatase 11/20/2013 127* 39 - 117 U/L Final  . AST 11/20/2013 35  0 - 37 U/L Final  . ALT 11/20/2013 55* 0 - 53 U/L Final  . Total Protein 11/20/2013 6.5  6.0 - 8.3 g/dL Final  . Albumin 11/20/2013 3.6  3.5 - 5.2 g/dL Final  . Calcium 11/20/2013 9.6  8.4 - 10.5 mg/dL Final  . GFR 11/20/2013 40.78* >60.00 mL/min Final  . Microalb, Ur 11/20/2013 18.8* 0.0 - 1.9 mg/dL Final  . Creatinine,U 11/20/2013 68.9   Final  . Microalb Creat Ratio 11/20/2013 27.3  0.0 - 30.0 mg/g Final  . Color, Urine 11/20/2013 YELLOW  Yellow;Lt. Yellow Final  . APPearance 11/20/2013 CLEAR  Clear Final  . Specific Gravity, Urine 11/20/2013 1.015  1.000-1.030 Final  . pH 11/20/2013 5.5  5.0 - 8.0 Final  . Total Protein, Urine 11/20/2013 30* Negative Final  . Urine Glucose 11/20/2013 250* Negative Final  . Ketones, ur 11/20/2013 NEGATIVE  Negative Final  . Bilirubin Urine  11/20/2013 NEGATIVE  Negative Final  . Hgb urine dipstick 11/20/2013 TRACE-LYSED* Negative Final  . Urobilinogen, UA 11/20/2013 0.2  0.0 - 1.0 Final  . Leukocytes, UA 11/20/2013 NEGATIVE  Negative Final  . Nitrite 11/20/2013 NEGATIVE  Negative Final  . RBC / HPF 11/20/2013 0-2/hpf  0-2/hpf Final  . Squamous Epithelial / LPF 11/20/2013 Rare(0-4/hpf)  Rare(0-4/hpf) Final  . TSH 11/20/2013 1.68  0.35 - 4.50 uIU/mL Final    Physical Examination:  BP 177/72  Pulse 75  Temp(Src) 98 F (36.7 C)  Resp 16  Ht 6\' 4"  (1.93 m)  Wt 264 lb 3.2 oz (119.84 kg)  BMI 32.17 kg/m2  Previous foot exam findings:  Vibration sense is absent in toes. Ankle jerks are absent bilaterally.          Diabetic foot exam:  No callus formation. Mild denudation of skin on left second toe superiorly Amputation of right third toe. Thickening and dryness of skin on the toes on the right Absent monofilament sensation in the feet Absent pedal pulses 2+ pedal edema, more on the right  .        ASSESSMENT:  Diabetes type 2, uncontrolled with neuropathy. His blood sugars are significantly elevated now and appears to be significantly insulin resistant; recent blood sugars are averaging over 300 Also he has not increased his Victoza the 1.2 mg as directed even with taking about 100 units of insulin a day indicating insulin resistance   Recent A1c is 7.8%  LIVER function abnormality: To be checked again today after stopping his lipid-lowering drugs and glipizide   PLAN:   He will switch to premixed insulin to Lantus and NovoLog Discussed adjustment of Lantus based on fasting readings and need to check blood sugars consistently on waking up and more often after meals Discussed timing of NovoLog insulin and actions to control postprandial hyperglycemia Discussed postprandial targets Victoza will be increased Nutritional counseling to be done   Patient Instructions  LANTUS 60 UNITS TWICE DAILY  NOVOLOG 35  UNITS with Bf and Lunch and 40 at dinner: If sugar over 300 add extra 15 units Novolog  Victoza 1.2 for 1 week and then next week 1.8 mg if no nausea ( may use in between dose )  Regular exercise   Counseling time over 50% of today's 25 minute visit   David [name] 11/21/2013, 12:48 PM   Note: This office note was prepared with Estate agent. Any transcriptional errors that result from this process are unintentional.  Addendum: Liver functions nearly normal

## 2013-11-20 NOTE — Patient Instructions (Signed)
LANTUS 60 UNITS TWICE DAILY  NOVOLOG 35 UNITS with Bf and Lunch and 40 at dinner: If sugar over 300 add extra 15 units Novolog  Victoza 1.2 for 1 week and then next week 1.8 mg if no nausea ( may use in between dose )  Regular exercise

## 2013-11-21 ENCOUNTER — Other Ambulatory Visit: Payer: Self-pay | Admitting: *Deleted

## 2013-11-21 ENCOUNTER — Telehealth: Payer: Self-pay | Admitting: Endocrinology

## 2013-11-21 MED ORDER — GLUCOSE BLOOD VI DISK: DISK | Status: DC

## 2013-11-21 MED ORDER — INSULIN GLARGINE 100 UNIT/ML SOLOSTAR PEN: 60.0000 [IU] | PEN_INJECTOR | Freq: Two times a day (BID) | SUBCUTANEOUS | Status: DC

## 2013-11-21 NOTE — Telephone Encounter (Signed)
For test strips please submit rx with dx code for ins coverage  Please change the dosage on the insulin for 60 u in AM and PM

## 2013-11-21 NOTE — Progress Notes (Signed)
Quick Note:  Please let patient know that the liver result is nearly normal and no further action needed ______

## 2013-11-21 NOTE — Telephone Encounter (Signed)
Change in med deal with lantus 60 unit

## 2013-11-25 NOTE — Telephone Encounter (Signed)
Have the wrong instructions on meds, supposed to be 60 in mor 2 x a day  -  60 in eve 2 x a day.  Please advise

## 2013-11-26 DIAGNOSIS — N2581 Secondary hyperparathyroidism of renal origin: Secondary | ICD-10-CM | POA: Diagnosis not present

## 2013-11-26 DIAGNOSIS — N183 Chronic kidney disease, stage 3 unspecified: Secondary | ICD-10-CM | POA: Diagnosis not present

## 2013-11-26 DIAGNOSIS — I129 Hypertensive chronic kidney disease with stage 1 through stage 4 chronic kidney disease, or unspecified chronic kidney disease: Secondary | ICD-10-CM | POA: Diagnosis not present

## 2013-11-26 DIAGNOSIS — R809 Proteinuria, unspecified: Secondary | ICD-10-CM | POA: Diagnosis not present

## 2013-12-05 ENCOUNTER — Telehealth: Payer: Self-pay | Admitting: Endocrinology

## 2013-12-05 NOTE — Telephone Encounter (Signed)
Patient states that his Lantus Rx is not right  He is supposed to be taking 60 units twice daily His new Rx states 60 units once daily  [employer]   Thank You

## 2013-12-06 DIAGNOSIS — L608 Other nail disorders: Secondary | ICD-10-CM | POA: Diagnosis not present

## 2013-12-06 DIAGNOSIS — E1149 Type 2 diabetes mellitus with other diabetic neurological complication: Secondary | ICD-10-CM | POA: Diagnosis not present

## 2013-12-06 DIAGNOSIS — S98139A Complete traumatic amputation of one unspecified lesser toe, initial encounter: Secondary | ICD-10-CM | POA: Diagnosis not present

## 2013-12-06 DIAGNOSIS — L84 Corns and callosities: Secondary | ICD-10-CM | POA: Diagnosis not present

## 2013-12-06 DIAGNOSIS — M2040 Other hammer toe(s) (acquired), unspecified foot: Secondary | ICD-10-CM | POA: Diagnosis not present

## 2013-12-11 ENCOUNTER — Telehealth: Payer: Self-pay | Admitting: Family

## 2013-12-11 NOTE — Telephone Encounter (Signed)
Yes, the labs can be drawn here and sent to Dr. Dwyane Dee

## 2013-12-11 NOTE — Telephone Encounter (Signed)
Pt has an appt here on friday for labs and at endo from dr David [name] on Friday for labs as well.  The orders are in the computer. Pt states it is much closer for them to come here. Is it ok for pt to have the labs from Dr Ronnie Derby office done here as well? Pls advise

## 2013-12-11 NOTE — Telephone Encounter (Signed)
lmom that its ok to have labs drawn here!

## 2013-12-13 ENCOUNTER — Other Ambulatory Visit (INDEPENDENT_AMBULATORY_CARE_PROVIDER_SITE_OTHER): Payer: Medicare Other

## 2013-12-13 ENCOUNTER — Other Ambulatory Visit: Payer: Medicare Other

## 2013-12-13 DIAGNOSIS — R7989 Other specified abnormal findings of blood chemistry: Secondary | ICD-10-CM

## 2013-12-13 DIAGNOSIS — E1159 Type 2 diabetes mellitus with other circulatory complications: Secondary | ICD-10-CM | POA: Diagnosis not present

## 2013-12-13 DIAGNOSIS — R945 Abnormal results of liver function studies: Principal | ICD-10-CM

## 2013-12-13 LAB — LIPID PANEL
Cholesterol: 109 mg/dL (ref 0–200)
HDL: 22.8 mg/dL — ABNORMAL LOW (ref >39.00)
NonHDL: 86.2
Total CHOL/HDL Ratio: 5
Triglycerides: 293 mg/dL — ABNORMAL HIGH (ref 0.0–149.0)
VLDL: 58.6 mg/dL — ABNORMAL HIGH (ref 0.0–40.0)

## 2013-12-13 LAB — HEPATIC FUNCTION PANEL
ALT: 32 U/L (ref 0–53)
AST: 25 U/L (ref 0–37)
Albumin: 3.5 g/dL (ref 3.5–5.2)
Alkaline Phosphatase: 104 U/L (ref 39–117)
Bilirubin, Direct: 0 mg/dL (ref 0.0–0.3)
Total Bilirubin: 0.4 mg/dL (ref 0.2–1.2)
Total Protein: 6.7 g/dL (ref 6.0–8.3)

## 2013-12-13 LAB — LDL CHOLESTEROL, DIRECT: Direct LDL: 46.1 mg/dL

## 2013-12-13 LAB — BASIC METABOLIC PANEL
BUN: 28 mg/dL — ABNORMAL HIGH (ref 6–23)
CO2: 27 mEq/L (ref 19–32)
Calcium: 9 mg/dL (ref 8.4–10.5)
Chloride: 105 mEq/L (ref 96–112)
Creatinine, Ser: 1.6 mg/dL — ABNORMAL HIGH (ref 0.4–1.5)
GFR: 45.88 mL/min — ABNORMAL LOW (ref >60.00)
Glucose, Bld: 227 mg/dL — ABNORMAL HIGH (ref 70–99)
Potassium: 4.4 mEq/L (ref 3.5–5.1)
Sodium: 136 mEq/L (ref 135–145)

## 2013-12-13 LAB — HEMOGLOBIN A1C: Hgb A1c MFr Bld: 10.2 % — ABNORMAL HIGH (ref 4.6–6.5)

## 2013-12-18 ENCOUNTER — Encounter: Payer: Self-pay | Admitting: Endocrinology

## 2013-12-18 ENCOUNTER — Ambulatory Visit (INDEPENDENT_AMBULATORY_CARE_PROVIDER_SITE_OTHER): Payer: Medicare Other | Admitting: Endocrinology

## 2013-12-18 VITALS — BP 158/68 | HR 86 | Temp 98.0°F | Resp 16 | Ht 76.0 in | Wt 273.4 lb

## 2013-12-18 DIAGNOSIS — E1159 Type 2 diabetes mellitus with other circulatory complications: Secondary | ICD-10-CM | POA: Diagnosis not present

## 2013-12-18 DIAGNOSIS — I10 Essential (primary) hypertension: Secondary | ICD-10-CM

## 2013-12-18 DIAGNOSIS — I2510 Atherosclerotic heart disease of native coronary artery without angina pectoris: Secondary | ICD-10-CM | POA: Diagnosis not present

## 2013-12-18 DIAGNOSIS — R600 Localized edema: Secondary | ICD-10-CM

## 2013-12-18 DIAGNOSIS — R609 Edema, unspecified: Secondary | ICD-10-CM | POA: Diagnosis not present

## 2013-12-18 DIAGNOSIS — E785 Hyperlipidemia, unspecified: Secondary | ICD-10-CM

## 2013-12-18 NOTE — Patient Instructions (Addendum)
Lantus 70 in pm and if in 5 days am sugar > 150 go to 75  Take 20 Novolog in am  If sugar after dinner > 200 take 45 Novolog before supper  Other doses same, use 1.2 mg Victoza   Please check blood sugars at least half the time about 2 hours after any meal and 4-5 times per week on waking up. Please bring blood sugar monitor to each visit

## 2013-12-18 NOTE — Progress Notes (Signed)
Patient ID: David [name], male   DOB: 1941/01/30, 73 y.o.   MRN: [id number]    Reason for Appointment: Followup for Type 2 Diabetes  Referring physician: Megan Salon  History of Present Illness:          Diagnosis: Type 2 diabetes mellitus, date of diagnosis:   1992       Past history:  He was initially treated with metformin and at some point also glipizide. His previous records from out of town are not available and no information is available about his level of control Apparently he was started on insulin in 1994 approximately because of poor control He has been on various insulin regimens over the last several years However even with insulin he has had poor control for at least the last 7 or 8 years. He does not know what his previous A1c levels have been. He had been continued on metformin and glipizide but metformin stopped about 2 years ago because of kidney function abnormality He had been taking Lantus 60 units twice a day with NovoLog until about 4 months ago  Recent history:  Before his initial consultation in 6/13 he was on NovoLog twice a day and Humalog mix insulin twice a day before meals With this he was having poor control He was started on Victoza; he was told to titrate this to 1.2 mg on his last visit in 7/15 Because of markedly elevated blood sugars he was changed from his premixed insulin to Lantus and NovoLog and the doses of NovoLog were increased for postprandial control His blood sugars are starting to improve but are mostly higher in the morning recently He is checking blood sugars mostly in the mornings and only sporadically and evenings, only occasionally after supper  Blood sugars are the lowest before lunch and he thinks he may have had mild hypoglycemia rarely Average blood sugar on his last visit  was 379 and now 193       Oral hypoglycemic drugs the patient is taking are: None       INSULIN regimen is described as:  Novolog 30-30-40; L 60 ac  bid  Glucose monitoring:  done 1 times a day         Glucometer:  contour     Blood Glucose readings from download show:  PREMEAL Breakfast Lunch  6-8 PM  Bedtime Overall  Glucose range:  154-292   105-138   147-263     Mean/median:    202    193    POST-MEAL PC Breakfast PC Lunch PC Dinner  Glucose range:   90-221    Mean/median:      Hypoglycemia: Rarely      Glycemic control:  Lab Results  Component Value Date   HGBA1C 10.2* 12/13/2013   HGBA1C 11.8* 10/29/2013   Lab Results  Component Value Date   MICROALBUR 18.8* 11/20/2013   LDLCALC 49 10/29/2013   CREATININE 1.6* 12/13/2013    Self-care: The diet that the patient has been following is: tries to limit fat intake but tends to have more snacks     Meals: 3 meals per day.breakfast is either cereal or eggs and toast, lunch is relatively lighter, dinner 6 pm        Exercise:  exercise bike usually daily 15-30 min; some walking        Dietician visit: Most recent: several years ago.               Compliance with  the medical regimen: good Retinal exam: Most recent: 05/2013.    Weight history:  Wt Readings from Last 3 Encounters:  12/18/13 273 lb 6.4 oz (124.013 kg)  11/20/13 264 lb 3.2 oz (119.84 kg)  11/01/13 271 lb 6.4 oz (123.106 kg)      Medication List       This list is accurate as of: 12/18/13 11:59 PM.  Always use your most recent med list.               aspirin 81 MG tablet  Take 81 mg by mouth daily.     B-D ULTRAFINE III SHORT PEN 31G X 8 MM Misc  Generic drug:  Insulin Pen Needle     carvedilol 25 MG tablet  Commonly known as:  COREG  Take 25 mg by mouth 2 (two) times daily with a meal.     carvedilol 12.5 MG tablet  Commonly known as:  COREG  Take 12.5 mg by mouth 2 (two) times daily with a meal.     doxazosin 2 MG tablet  Commonly known as:  CARDURA  Take 2 mg by mouth daily.     Fish Oil 1000 MG Caps  Take by mouth 2 (two) times daily.     Glucose Blood Disk  Commonly known as:  BAYER  BREEZE 2 TEST  Checks 3 times per day dx code 250.72     insulin aspart 100 UNIT/ML injection  Commonly known as:  NOVOLOG  Inject 20 Units into the skin 2 (two) times daily. As directed     Insulin Glargine 100 UNIT/ML Solostar Pen  Commonly known as:  LANTUS SOLOSTAR  Inject 60 Units into the skin 2 (two) times daily.     isosorbide mononitrate 30 MG 24 hr tablet  Commonly known as:  IMDUR  Take 2 tablets (60 mg total) by mouth daily.     Loratadine 10 MG Caps  Take by mouth daily.     pantoprazole 40 MG tablet  Commonly known as:  PROTONIX  Take 40 mg by mouth daily.     torsemide 20 MG tablet  Commonly known as:  DEMADEX  Take 2 tablets (40 mg total) by mouth daily.     VICTOZA 18 MG/3ML Sopn  Generic drug:  Liraglutide  Inject 0.6 mg into the skin daily. Inject once daily at the same time        Allergies: No Known Allergies  Past Medical History  Diagnosis Date  . Diabetes   . Hypertension   . CAD (coronary artery disease)   . Renal insufficiency   . Sleep apnea   . Palpitations   . Dyspnea   . Sinus bradycardia   . Obesity   . Allergy   . Kidney failure   . Arthritis   . Diabetes   . Anxiety   . Cancer     skin    Past Surgical History  Procedure Laterality Date  . Insert / replace / remove pacemaker    . Knee surgery    . Vein ligation and stripping    . 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 Cancer Mother     breast  . Heart attack Father     Social History:  reports that he has quit smoking. He does not have any smokeless tobacco history on file. His alcohol and drug histories are not  on file.    Review of Systems       Lipids: Had been on a Lipitor since late 2014. Also had taken Gembrozil for several years without apparent side effects.  Both of these were recently stopped because of increased liver functions and LFT has been back to normal       Lab Results    Component Value Date   CHOL 109 12/13/2013   HDL 22.80* 12/13/2013   LDLCALC 49 10/29/2013   LDLDIRECT 46.1 12/13/2013   TRIG 293.0* 12/13/2013   CHOLHDL 5 12/13/2013       The blood pressure has been treated with various drugs. Previously on lisinopril and this is possibly stop by nephrologist. 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 Ankle jerks are absent bilaterally.          Diabetic foot exam:  No callus formation. Mild denudation of skin on left second toe superiorly Amputation of right third toe. Thickening and dryness of skin on the toes on the right Absent monofilament sensation in the feet Absent pedal pulses 2+ pedal edema, more on the right  .        LABS:  Appointment on 12/13/2013  Component Date Value Ref Range Status  . Total Bilirubin 12/13/2013 0.4  0.2 - 1.2 mg/dL Final  . Bilirubin, Direct 12/13/2013 0.0  0.0 - 0.3 mg/dL Final  . Alkaline Phosphatase 12/13/2013 104  39 - 117 U/L Final  . AST 12/13/2013 25  0 - 37 U/L Final  . ALT 12/13/2013 32  0 - 53 U/L Final  . Total Protein 12/13/2013 6.7  6.0 - 8.3 g/dL Final  . Albumin 12/13/2013 3.5  3.5 - 5.2 g/dL Final  . Hemoglobin A1C 12/13/2013 10.2* 4.6 - 6.5 % Final   Glycemic Control Guidelines for People with Diabetes:Non Diabetic:  <6%Goal of Therapy: <7%Additional Action Suggested:  >8%   . Direct LDL 12/13/2013 46.1   Final   Optimal:  <100 mg/dLNear or Above Optimal:  100-129 mg/dLBorderline High:  130-159 mg/dLHigh:  160-189 mg/dLVery High:  >190 mg/dL  . Cholesterol 12/13/2013 109  0 - 200 mg/dL Final   ATP III Classification       Desirable:  < 200 mg/dL                Borderline High:  200 - 239 mg/dL          High:  > = 240 mg/dL  . Triglycerides 12/13/2013 293.0* 0.0 - 149.0 mg/dL Final   Normal:  <150 mg/dLBorderline High:  150 - 199 mg/dL  . HDL 12/13/2013 22.80* >39.00 mg/dL Final  . VLDL 12/13/2013 58.6* 0.0 - 40.0 mg/dL Final  . Total CHOL/HDL Ratio 12/13/2013 5   Final                  Men          Women1/2 Average Risk     3.4          3.3Average Risk  5.0          4.42X Average Risk          9.6          7.13X Average Risk          15.0          11.0                      . NonHDL 12/13/2013 86.20   Final   NOTE:  Non-HDL goal should be 30 mg/dL higher than patient's LDL goal (i.e. LDL goal of < 70 mg/dL, would have non-HDL goal of < 100 mg/dL)  . Sodium 12/13/2013 136  135 - 145 mEq/L Final  . Potassium 12/13/2013 4.4  3.5 - 5.1 mEq/L Final  . Chloride 12/13/2013 105  96 - 112 mEq/L Final  . CO2 12/13/2013 27  19 - 32 mEq/L Final  . Glucose, Bld 12/13/2013 227* 70 - 99 mg/dL Final  . BUN 12/13/2013 28* 6 - 23 mg/dL Final  . Creatinine, Ser 12/13/2013 1.6* 0.4 - 1.5 mg/dL Final  . Calcium 12/13/2013 9.0  8.4 - 10.5 mg/dL Final  . GFR 12/13/2013 45.88* >60.00 mL/min Final    Physical Examination:  BP 158/68  Pulse 86  Temp(Src) 98 F (36.7 C)  Resp 16  Ht 6\' 4"  (1.93 m)  Wt 273 lb 6.4 oz (124.013 kg)  BMI 33.29 kg/m2  SpO2 94%  2+ ankle edema    ASSESSMENT:  Diabetes type 2, uncontrolled with neuropathy. His blood sugars are significantly better with a regimen of high-dose Lantus and NovoLog before meals A1c is still significantly high but blood sugars have been better only in the last couple of weeks Most of his blood sugars are higher in the morning and will need to increase his evening basal insulin Also because of low normal readings at lunchtime will reduce his morning NovoLog Will need to have a better idea of his postprandial readings and evenings and this will help adjust her suppertime dose  PLAN:  Continue  Victoza 1.2 mg May need to increase evening Lantus further, to start with 70 today, see details in patient instructions Recheck A1c in about 3 months Encouraged increased exercise  He will discuss treatment of his edema with nephrologist  Review lipids on the next visit and consider using pravastatin  Consultation with the dietitian for meal planning  Patient Instructions  Lantus 70 in pm and if in 5 days am sugar > 150 go to 75  Take 20 Novolog in am  If sugar after dinner > 200 take 45 Novolog before supper  Other doses same, use 1.2 mg Victoza   Please check blood sugars at least half the time about 2 hours after any meal and 4-5 times per week on waking up. Please bring blood sugar monitor to each visit     Counseling time over 50% of today's 25 minute visit   Vennie Waymire 12/19/2013, 9:48 AM   Note: This office note was prepared with Estate agent. Any transcriptional errors that result from this process are unintentional.

## 2013-12-20 ENCOUNTER — Ambulatory Visit: Payer: Medicare Other | Admitting: Dietician

## 2013-12-24 ENCOUNTER — Other Ambulatory Visit: Payer: Self-pay | Admitting: Internal Medicine

## 2013-12-30 DIAGNOSIS — I129 Hypertensive chronic kidney disease with stage 1 through stage 4 chronic kidney disease, or unspecified chronic kidney disease: Secondary | ICD-10-CM | POA: Diagnosis not present

## 2014-01-02 ENCOUNTER — Other Ambulatory Visit: Payer: Self-pay | Admitting: Internal Medicine

## 2014-01-02 ENCOUNTER — Telehealth: Payer: Self-pay | Admitting: Family

## 2014-01-02 NOTE — Telephone Encounter (Signed)
Pt is needing new rx doxazosin 2 mg once daily, sent to [facility] on [email or web address]

## 2014-01-03 MED ORDER — DOXAZOSIN MESYLATE 2 MG PO TABS: 2.0000 mg | ORAL_TABLET | Freq: Every day | ORAL | Status: DC

## 2014-01-03 NOTE — Telephone Encounter (Signed)
Done

## 2014-01-09 ENCOUNTER — Other Ambulatory Visit: Payer: Self-pay | Admitting: Family

## 2014-01-21 ENCOUNTER — Encounter: Payer: Self-pay | Admitting: Internal Medicine

## 2014-01-22 ENCOUNTER — Ambulatory Visit (INDEPENDENT_AMBULATORY_CARE_PROVIDER_SITE_OTHER): Payer: Medicare Other | Admitting: Internal Medicine

## 2014-01-22 ENCOUNTER — Encounter: Payer: Self-pay | Admitting: Internal Medicine

## 2014-01-22 VITALS — BP 172/60 | HR 75 | Ht 76.0 in | Wt 269.1 lb

## 2014-01-22 DIAGNOSIS — I495 Sick sinus syndrome: Secondary | ICD-10-CM

## 2014-01-22 DIAGNOSIS — I2510 Atherosclerotic heart disease of native coronary artery without angina pectoris: Secondary | ICD-10-CM | POA: Diagnosis not present

## 2014-01-22 DIAGNOSIS — Z950 Presence of cardiac pacemaker: Secondary | ICD-10-CM | POA: Diagnosis not present

## 2014-01-22 LAB — MDC_IDC_ENUM_SESS_TYPE_INCLINIC
Battery Impedance: 2917 Ohm
Battery Remaining Longevity: 16 mo
Battery Voltage: 2.71 V
Brady Statistic AP VP Percent: 29 %
Brady Statistic AP VS Percent: 68 %
Brady Statistic AS VP Percent: 3 %
Brady Statistic AS VS Percent: 1 %
Date Time Interrogation Session: 20150909095854
Lead Channel Impedance Value: 495 Ohm
Lead Channel Impedance Value: 598 Ohm
Lead Channel Pacing Threshold Amplitude: 1 V
Lead Channel Pacing Threshold Amplitude: 1 V
Lead Channel Pacing Threshold Pulse Width: 0.4 ms
Lead Channel Pacing Threshold Pulse Width: 1 ms
Lead Channel Sensing Intrinsic Amplitude: 2 mV
Lead Channel Sensing Intrinsic Amplitude: 22.4 mV
Lead Channel Setting Pacing Amplitude: 2 V
Lead Channel Setting Pacing Amplitude: 2.5 V
Lead Channel Setting Pacing Pulse Width: 0.4 ms
Lead Channel Setting Sensing Sensitivity: 5.6 mV

## 2014-01-22 MED ORDER — AMLODIPINE BESYLATE 5 MG PO TABS: 5.0000 mg | ORAL_TABLET | Freq: Every day | ORAL | Status: DC

## 2014-01-22 NOTE — Progress Notes (Signed)
Patient Care Team: Timoteo Gaul, FNP as PCP - General (Family Medicine)   HPI  David [name] is a 73 y.o. male Seen in followup for pacemaker implanted for sinus node dysfunction and dyspnea on exertion.  He continues with peripheral edema; at his last visit we stop his amlodipine in anticipation of his seeing Dr. Dwyane Dee with the hopes that alternative antihypertensive therapy could be identified   the edema did not change. He was given a burst of a diuretic by renal. His edema improved somewhat at that time.  Echocardiogram 6/15 normal LV function and severe left ventricular hypertrophy with left atrial enlargement consistent with hypertensive heart disease  Evaluation [place] included a Myoview that was false positive catheterizations were undertaken in 2009, then, 12 demonstrating 60% of the RCA, 70% in 240% circumflex and 60% OM1 40% LAD.   He has renal insufficiency grade 3 with a GFR 7/15 and 46     Past Medical History  Diagnosis Date  . Diabetes   . Hypertension   . CAD (coronary artery disease)   . Renal insufficiency   . Sleep apnea   . Palpitations   . Dyspnea   . Sinus bradycardia   . Obesity   . Allergy   . Kidney failure   . Arthritis   . Diabetes   . Anxiety   . Cancer     skin    Past Surgical History  Procedure Laterality Date  . Insert / replace / remove pacemaker    . Knee surgery    . Vein ligation and stripping    . 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 aspirin 81 MG tablet Take 81 mg by mouth daily.      . B-D ULTRAFINE III SHORT PEN 31G X 8 MM MISC       . carvedilol (COREG) 25 MG tablet Take 25 mg by mouth 2 (two) times daily with a meal.      . doxazosin (CARDURA) 2 MG tablet Take 1 tablet (2 mg total) by mouth daily.  90 tablet  0  . Glucose Blood (BAYER BREEZE 2 TEST) DISK Checks 3 times per day dx code 250.72  100 each  3  . Insulin  Glargine (LANTUS SOLOSTAR) 100 UNIT/ML Solostar Pen Inject 60 Units into the skin 2 (two) times daily.  15 pen  5  . isosorbide mononitrate (IMDUR) 30 MG 24 hr tablet TAKE TWO TABLETS BY MOUTH ONCE DAILY  60 tablet  0  . Liraglutide (VICTOZA) 18 MG/3ML SOPN Inject 0.6 mg into the skin daily. Inject once daily at the same time      . Loratadine 10 MG CAPS Take by mouth daily.      Marland Kitchen NOVOLOG 100 UNIT/ML injection INJECT 20 UNITS INTO THE SKIN 2 TIMES DAILY AS DIRECTED  30 mL  2  . Omega-3 Fatty Acids (FISH OIL) 1000 MG CAPS Take by mouth 2 (two) times daily.      . pantoprazole (PROTONIX) 40 MG tablet Take 40 mg by mouth daily.       Marland Kitchen torsemide (DEMADEX) 20 MG tablet TAKE TWO TABLETS BY MOUTH ONCE DAILY  60 tablet  0   No current facility-administered medications for this visit.    No Known Allergies  Review of Systems negative except from HPI and PMH  Physical Exam BP 172/60  Pulse  75  Ht 6\' 4"  (1.93 m)  Wt 269 lb 2 oz (122.074 kg)  BMI 32.77 kg/m2 Well developed and well nourished in no acute distress HENT normal E scleral and icterus clear Neck Supple JVP flat; carotids brisk and full Clear to ausculation  Regular rate and rhythm, no murmurs gallops or rub Soft with active bowel sounds No clubbing cyanosis 1+ Edema Alert and oriented, grossly normal motor and sensory function Skin Warm and Dry  ECG demonstrates atrial pacing at 67@intervals  24/12/44 Poor R-wave progression and inferolateral T wave is  Assessment and  Plan Sinus node dysfunction  Renal dysfunction  Hypertension  Sleep apnea-treated   Chronotropic competence is restored by his device. His blood pressure remains elevated; we will have him resume his amlodipine. He is to see Dr. Dwyane Dee in just about a weeks time and further up titration will be deferred.

## 2014-01-22 NOTE — Patient Instructions (Signed)
Your physician has recommended you make the following change in your medication:  1) RESUME Amlodipine 5 mg daily  Remote monitoring is used to monitor your Pacemaker of ICD from home. This monitoring reduces the number of office visits required to check your device to one time per year. It allows [name] to keep an eye on the functioning of your device to ensure it is working properly. You are scheduled for a device check from home on 04/28/14. You may send your transmission at any time that day. If you have a wireless device, the transmission will be sent automatically. After your physician reviews your transmission, you will receive a postcard with your next transmission date.  Your physician wants you to follow-up in: 9 months with Dr. Caryl Comes.  You will receive a reminder letter in the mail two months in advance. If you don't receive a letter, please call our office to schedule the follow-up appointment.

## 2014-01-27 ENCOUNTER — Encounter: Payer: Self-pay | Admitting: Internal Medicine

## 2014-01-30 ENCOUNTER — Encounter: Payer: Self-pay | Admitting: Endocrinology

## 2014-01-30 ENCOUNTER — Ambulatory Visit (INDEPENDENT_AMBULATORY_CARE_PROVIDER_SITE_OTHER): Payer: Medicare Other | Admitting: Endocrinology

## 2014-01-30 VITALS — BP 133/57 | HR 92 | Temp 98.2°F | Resp 16 | Ht 76.0 in | Wt 268.2 lb

## 2014-01-30 DIAGNOSIS — E785 Hyperlipidemia, unspecified: Secondary | ICD-10-CM

## 2014-01-30 DIAGNOSIS — E1159 Type 2 diabetes mellitus with other circulatory complications: Secondary | ICD-10-CM

## 2014-01-30 DIAGNOSIS — N183 Chronic kidney disease, stage 3 unspecified: Secondary | ICD-10-CM

## 2014-01-30 DIAGNOSIS — I2510 Atherosclerotic heart disease of native coronary artery without angina pectoris: Secondary | ICD-10-CM | POA: Diagnosis not present

## 2014-01-30 DIAGNOSIS — Z23 Encounter for immunization: Secondary | ICD-10-CM

## 2014-01-30 LAB — COMPREHENSIVE METABOLIC PANEL
ALT: 25 U/L (ref 0–53)
AST: 21 U/L (ref 0–37)
Albumin: 3.6 g/dL (ref 3.5–5.2)
Alkaline Phosphatase: 89 U/L (ref 39–117)
BUN: 43 mg/dL — ABNORMAL HIGH (ref 6–23)
CO2: 26 mEq/L (ref 19–32)
Calcium: 9 mg/dL (ref 8.4–10.5)
Chloride: 104 mEq/L (ref 96–112)
Creatinine, Ser: 2.2 mg/dL — ABNORMAL HIGH (ref 0.4–1.5)
GFR: 31.63 mL/min — ABNORMAL LOW (ref >60.00)
Glucose, Bld: 194 mg/dL — ABNORMAL HIGH (ref 70–99)
Potassium: 4.7 mEq/L (ref 3.5–5.1)
Sodium: 137 mEq/L (ref 135–145)
Total Bilirubin: 0.6 mg/dL (ref 0.2–1.2)
Total Protein: 7 g/dL (ref 6.0–8.3)

## 2014-01-30 LAB — LIPID PANEL
Cholesterol: 152 mg/dL (ref 0–200)
HDL: 17.6 mg/dL — ABNORMAL LOW (ref >39.00)
NonHDL: 134.4
Total CHOL/HDL Ratio: 9
Triglycerides: 333 mg/dL — ABNORMAL HIGH (ref 0.0–149.0)
VLDL: 66.6 mg/dL — ABNORMAL HIGH (ref 0.0–40.0)

## 2014-01-30 LAB — LDL CHOLESTEROL, DIRECT: Direct LDL: 62.4 mg/dL

## 2014-01-30 MED ORDER — INSULIN GLARGINE 300 UNIT/ML ~~LOC~~ SOPN: 70.0000 [IU] | PEN_INJECTOR | Freq: Two times a day (BID) | SUBCUTANEOUS | Status: DC

## 2014-01-30 NOTE — Patient Instructions (Signed)
increase exercise  Please check blood sugars at least half the time about 2 hours after any meal and 4 times per week on waking up. Please bring blood sugar monitor to each visit  Lantus or Toujeo 70 units am and 75 in pm  If sugars are < 100 then back down 10 units  Novolog 30--20--40 at meals

## 2014-01-30 NOTE — Progress Notes (Signed)
Patient ID: David [name], male   DOB: 14-Feb-1941, 73 y.o.   MRN: [id number]    Reason for Appointment: Followup for Type 2 Diabetes  Referring physician: Megan Salon  History of Present Illness:          Diagnosis: Type 2 diabetes mellitus, date of diagnosis:   1992       Past history:  He was initially treated with metformin and at some point also glipizide. His previous records from out of town are not available and no information is available about his level of control Apparently he was started on insulin in 1994 approximately because of poor control He has been on various insulin regimens over the last several years However even with insulin he has had poor control for at least the last 7 or 8 years. He does not know what his previous A1c levels have been. He had been continued on metformin and glipizide but metformin stopped about 2 years ago because of kidney function abnormality He had been taking Lantus 60 units twice a day with NovoLog until about 4 months ago  Recent history:  Before his initial consultation in 6/13 he was on NovoLog twice a day and Humalog mix insulin twice a day before meals With this he was having poor control He was started on Victoza and has increased this to 1.2 mg  He is also on a regimen of Lantus twice a day and NovoLog before meals Not clear why he has reduced his dose of NovoLog and stopped taking it at lunchtime He says he is eating a light meal at lunchtime but does have a sandwich His blood sugars overall higher including overnight and only occasionally better after lunch, most readings are over 200, higher towards the evening Overall has higher average blood sugars in the last month compared to the previous visit, recent A1c not available He has also not been motivated to exercise, previously using exercise bike       Oral hypoglycemic drugs the patient is taking are: None       INSULIN regimen is described as:  Novolog 20-30-20; L  60 ac bid  Glucose monitoring:  done 1 times a day         Glucometer:  contour     Blood Glucose readings from download for 30 days show:  PREMEAL Breakfast Lunch Dinner Bedtime Overall  Glucose range:  172-367   352   218-394  310    Mean/median:  246     239   POST-MEAL PC Breakfast PC Lunch PC Dinner  Glucose range:    86 -305    Mean/median:      Hypoglycemia:  minimal      Glycemic control:   Lab Results  Component Value Date   HGBA1C 10.2* 12/13/2013   HGBA1C 11.8* 10/29/2013   Lab Results  Component Value Date   MICROALBUR 18.8* 11/20/2013   LDLCALC 49 10/29/2013   CREATININE 1.6* 12/13/2013    Self-care: The diet that the patient has been following is: tries to limit fat intake but tends to have more snacks     Meals: 3 meals per day.breakfast is either cereal or eggs and toast, lunch isusually a sandwich , dinnerand  6-7 pm        Exercise:  not doing exercise bike, previously  was doing 15-30 min        Dietician visit: Most recent: several years ago.  Compliance with the medical regimen: good Retinal exam: Most recent: 05/2013.    Weight history:  Wt Readings from Last 3 Encounters:  01/30/14 268 lb 3.2 oz (121.655 kg)  01/22/14 269 lb 2 oz (122.074 kg)  12/18/13 273 lb 6.4 oz (124.013 kg)      Medication List       This list is accurate as of: 01/30/14  1:34 PM.  Always use your most recent med list.               amLODipine 5 MG tablet  Commonly known as:  NORVASC  Take 1 tablet (5 mg total) by mouth daily.     aspirin 81 MG tablet  Take 81 mg by mouth daily.     B-D ULTRAFINE III SHORT PEN 31G X 8 MM Misc  Generic drug:  Insulin Pen Needle     carvedilol 25 MG tablet  Commonly known as:  COREG  Take 25 mg by mouth 2 (two) times daily with a meal.     doxazosin 2 MG tablet  Commonly known as:  CARDURA  Take 1 tablet (2 mg total) by mouth daily.     Fish Oil 1000 MG Caps  Take by mouth 2 (two) times daily.     Glucose Blood  Disk  Commonly known as:  BAYER BREEZE 2 TEST  Checks 3 times per day dx code 250.72     Insulin Glargine 100 UNIT/ML Solostar Pen  Commonly known as:  LANTUS SOLOSTAR  Inject 60 Units into the skin 2 (two) times daily.     isosorbide mononitrate 30 MG 24 hr tablet  Commonly known as:  IMDUR  TAKE TWO TABLETS BY MOUTH ONCE DAILY     Loratadine 10 MG Caps  Take by mouth daily.     NOVOLOG 100 UNIT/ML injection  Generic drug:  insulin aspart  INJECT 20 UNITS INTO THE SKIN 2 TIMES DAILY AS DIRECTED     pantoprazole 40 MG tablet  Commonly known as:  PROTONIX  Take 40 mg by mouth daily.     torsemide 20 MG tablet  Commonly known as:  DEMADEX  TAKE TWO TABLETS BY MOUTH ONCE DAILY     VICTOZA 18 MG/3ML Sopn  Generic drug:  Liraglutide  Inject 1.2 mg into the skin daily. Inject once daily at the same time        Allergies: No Known Allergies  Past Medical History  Diagnosis Date  . Diabetes   . Hypertension   . CAD (coronary artery disease)   . Renal insufficiency   . Sleep apnea   . Palpitations   . Dyspnea   . Sinus bradycardia   . Obesity   . Allergy   . Kidney failure   . Arthritis   . Diabetes   . Anxiety   . Cancer     skin    Past Surgical History  Procedure Laterality Date  . Insert / replace / remove pacemaker    . Knee surgery    . Vein ligation and stripping    . 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 Cancer Mother     breast  . Heart attack Father     Social History:  reports that he has quit smoking. He does not have any smokeless tobacco history on file. His alcohol and drug histories are not on file.  Review of Systems       Lipids: Had been on a Lipitor since late 2014. Also had taken Gembrozil for several years without apparent side effects.  Both of these were recently stopped because of increased liver functions and LFT has been back to normal Currently taking only  fish oil       Lab Results  Component Value Date   CHOL 109 12/13/2013   HDL 22.80* 12/13/2013   LDLCALC 49 10/29/2013   LDLDIRECT 46.1 12/13/2013   TRIG 293.0* 12/13/2013   CHOLHDL 5 12/13/2013       The blood pressure has been treated with various drugs. Previously on lisinopril and this is possibly stop by nephrologist. 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 Ankle jerks are absent bilaterally.          Diabetic foot exam:  No callus formation. Mild denudation of skin on left second toe superiorly Amputation of right third toe. Thickening and dryness of skin on the toes on the right Absent monofilament sensation in the feet Absent pedal pulses     Physical Examination:  BP 133/57  Pulse 92  Temp(Src) 98.2 F (36.8 C)  Resp 16  Ht 6\' 4"  (1.93 m)  Wt 268 lb 3.2 oz (121.655 kg)  BMI 32.66 kg/m2  SpO2 95%  2+ ankle edema    ASSESSMENT:  Diabetes type 2, uncontrolled with neuropathy. His blood sugars on his last visit were improving but they are now worsening This is partly related to his inconsistent diet and exercise regimen Also he has not taken enough mealtime insulin and blood sugars are high after breakfast and supper at least He only has some good readings after lunch when he takes his NovoLog insulin Fasting readings also have been consistently high Although he is a good candidate for using U-500 insulin he is probably not afford this better with because of his insurance limitations This may also help his overnight hyperglycemia  PLAN:   Continue Victoza 1.2  mg  Increase both morning and evening Lantus, and also switch him to [facility] for now  Increase his mealtime insulin at breakfast and supper and restart lunchtime coverage  Consider U-500 insulin when he is not in the Medicare gap  Consider insulin pump, basic information discussed today and brochures given  Check fructosamine  Recheck renal function  Recheck A1c in about 3 months  Need followup lipids  Restart exercise  Reschedule consultation with the dietitian for meal planning which he had canceled in August  Patient Instructions  increase exercise  Please check blood sugars at least half the time about 2 hours after any meal and 4 times per week on waking up. Please bring blood sugar monitor to each visit  Lantus or Toujeo 70 units am and 75 in pm  If sugars are < 100 then back down 10 units  Novolog 30--20--40 at meals   Counseling time over 50% of today's 25 minute visit  Kissy Cielo 01/30/2014, 1:34 PM   Note:  This office note was prepared with Estate agent. Any transcriptional errors that result from this process are unintentional.   Addendum: Labs as follows, creatinine is higher and he will need to followup with nephrologist LDL is high, triglycerides may improve with weight loss and better diabetes control and currently fenofibrate may be contraindicated with renal dysfunction  Office Visit on 01/30/2014  Component Date Value Ref Range Status  . Sodium 01/30/2014 137  135 - 145 mEq/L Final  . Potassium 01/30/2014 4.7  3.5 - 5.1 mEq/L Final  . Chloride 01/30/2014 104  96 - 112 mEq/L Final  . CO2 01/30/2014 26  19 - 32 mEq/L Final  . Glucose, Bld 01/30/2014 194* 70 - 99 mg/dL Final  . BUN 01/30/2014 43* 6 - 23 mg/dL Final  . Creatinine, Ser 01/30/2014 2.2* 0.4 - 1.5 mg/dL Final  . Total Bilirubin 01/30/2014 0.6  0.2 - 1.2 mg/dL Final  . Alkaline Phosphatase 01/30/2014 89  39 - 117 U/L Final  . AST 01/30/2014 21  0 - 37 U/L  Final  . ALT 01/30/2014 25  0 - 53 U/L Final  . Total Protein 01/30/2014 7.0  6.0 - 8.3 g/dL Final  . Albumin 01/30/2014 3.6  3.5 - 5.2 g/dL Final  . Calcium 01/30/2014 9.0  8.4 - 10.5 mg/dL Final  . GFR 01/30/2014 31.63* >60.00 mL/min Final  . Cholesterol 01/30/2014 152  0 - 200 mg/dL Final   ATP III Classification       Desirable:  < 200 mg/dL               Borderline High:  200 - 239 mg/dL          High:  > = 240 mg/dL  . Triglycerides 01/30/2014 333.0* 0.0 - 149.0 mg/dL Final   Normal:  <150 mg/dLBorderline High:  150 - 199 mg/dL  . HDL 01/30/2014 17.60* >39.00 mg/dL Final  . VLDL 01/30/2014 66.6* 0.0 - 40.0 mg/dL Final  . Total CHOL/HDL Ratio 01/30/2014 9   Final                  Men          Women1/2 Average Risk     3.4          3.3Average Risk          5.0          4.42X Average Risk          9.6          7.13X Average Risk          15.0          11.0                      . NonHDL 01/30/2014 134.40   Final   NOTE:  Non-HDL goal should be 30 mg/dL higher than patient's LDL goal (i.e. LDL goal of < 70 mg/dL, would have non-HDL goal of < 100 mg/dL)  . Direct LDL 01/30/2014 62.4   Final   Optimal:  <100 mg/dLNear or Above Optimal:  100-129 mg/dLBorderline High:  130-159 mg/dLHigh:  160-189 mg/dLVery High:  >190 mg/dL

## 2014-01-30 NOTE — Progress Notes (Signed)
Quick Note:  Kidney test is worse with creatinine 2.2, and needs to make followup appointment with nephrologist, liver tests okay Blood fat/triglycerides are high from poor diabetes control; cholesterol okay ______

## 2014-01-31 NOTE — Addendum Note (Signed)
Addended by: Elayne Snare on: 01/31/2014 08:03 AM   Modules accepted: Orders

## 2014-02-01 ENCOUNTER — Other Ambulatory Visit: Payer: Self-pay | Admitting: Internal Medicine

## 2014-02-01 LAB — FRUCTOSAMINE: Fructosamine: 326 umol/L — ABNORMAL HIGH (ref 190–270)

## 2014-02-03 ENCOUNTER — Telehealth: Payer: Self-pay | Admitting: Family

## 2014-02-03 NOTE — Telephone Encounter (Signed)
Spoke with pt's wife. He has an appointment in January, however, she has already called [facility] and is awaiting a returned call to schedule a sooner appointment.

## 2014-02-03 NOTE — Telephone Encounter (Signed)
Please call and be sure patient follow-up with nephrology kidney function.

## 2014-02-10 DIAGNOSIS — L84 Corns and callosities: Secondary | ICD-10-CM | POA: Diagnosis not present

## 2014-02-10 DIAGNOSIS — S98139A Complete traumatic amputation of one unspecified lesser toe, initial encounter: Secondary | ICD-10-CM | POA: Diagnosis not present

## 2014-02-10 DIAGNOSIS — L608 Other nail disorders: Secondary | ICD-10-CM | POA: Diagnosis not present

## 2014-02-10 DIAGNOSIS — L97509 Non-pressure chronic ulcer of other part of unspecified foot with unspecified severity: Secondary | ICD-10-CM | POA: Diagnosis not present

## 2014-02-10 DIAGNOSIS — E1149 Type 2 diabetes mellitus with other diabetic neurological complication: Secondary | ICD-10-CM | POA: Diagnosis not present

## 2014-02-21 DIAGNOSIS — R809 Proteinuria, unspecified: Secondary | ICD-10-CM | POA: Diagnosis not present

## 2014-02-21 DIAGNOSIS — N183 Chronic kidney disease, stage 3 (moderate): Secondary | ICD-10-CM | POA: Diagnosis not present

## 2014-02-21 DIAGNOSIS — I129 Hypertensive chronic kidney disease with stage 1 through stage 4 chronic kidney disease, or unspecified chronic kidney disease: Secondary | ICD-10-CM | POA: Diagnosis not present

## 2014-02-21 DIAGNOSIS — N2581 Secondary hyperparathyroidism of renal origin: Secondary | ICD-10-CM | POA: Diagnosis not present

## 2014-02-25 ENCOUNTER — Ambulatory Visit: Payer: Medicare Other | Admitting: Nutrition

## 2014-02-27 ENCOUNTER — Encounter: Payer: Self-pay | Admitting: Endocrinology

## 2014-02-27 ENCOUNTER — Ambulatory Visit (INDEPENDENT_AMBULATORY_CARE_PROVIDER_SITE_OTHER): Payer: Medicare Other | Admitting: Endocrinology

## 2014-02-27 VITALS — BP 122/60 | HR 88 | Temp 98.2°F | Resp 16 | Ht 76.0 in | Wt 276.0 lb

## 2014-02-27 DIAGNOSIS — E1149 Type 2 diabetes mellitus with other diabetic neurological complication: Secondary | ICD-10-CM | POA: Diagnosis not present

## 2014-02-27 DIAGNOSIS — E1165 Type 2 diabetes mellitus with hyperglycemia: Principal | ICD-10-CM

## 2014-02-27 DIAGNOSIS — E782 Mixed hyperlipidemia: Secondary | ICD-10-CM

## 2014-02-27 DIAGNOSIS — I2510 Atherosclerotic heart disease of native coronary artery without angina pectoris: Secondary | ICD-10-CM

## 2014-02-27 DIAGNOSIS — L84 Corns and callosities: Secondary | ICD-10-CM | POA: Diagnosis not present

## 2014-02-27 DIAGNOSIS — IMO0002 Reserved for concepts with insufficient information to code with codable children: Secondary | ICD-10-CM | POA: Insufficient documentation

## 2014-02-27 DIAGNOSIS — L602 Onychogryphosis: Secondary | ICD-10-CM | POA: Diagnosis not present

## 2014-02-27 DIAGNOSIS — E1141 Type 2 diabetes mellitus with diabetic mononeuropathy: Secondary | ICD-10-CM | POA: Diagnosis not present

## 2014-02-27 NOTE — Patient Instructions (Addendum)
Exercise daily  Please check blood sugars at least half the time about 2 hours after any meal and 3 times per week on waking up.  Please bring blood sugar monitor to each visit  Increase Toujeo to 80 at night and 85 if am sugar is still >150  Novolog 25 in am

## 2014-02-27 NOTE — Progress Notes (Signed)
Patient ID: David [name], male   DOB: Oct 01, 1940, 73 y.o.   MRN: [id number]    Reason for Appointment: Followup for Type 2 Diabetes  Referring physician: Megan Salon  History of Present Illness:          Diagnosis: Type 2 diabetes mellitus, date of diagnosis:   1992       Past history:  He was initially treated with metformin and at some point also glipizide. His previous records from out of town are not available and no information is available about his level of control Apparently he was started on insulin in 1994 approximately because of poor control He has been on various insulin regimens over the last several years However even with insulin he has had poor control for at least the last 7 or 8 years. He does not know what his previous A1c levels have been. He had been continued on metformin and glipizide but metformin stopped about 2 years ago because of kidney function abnormality He had been taking Lantus 60 units twice a day with NovoLog previously and also Before his initial consultation in 6/15 he was on NovoLog twice a day and Humalog mix insulin   Recent history:  Because of poor control and large insulin doses he was started on Victoza in 6/15 He is taking 1.2 mg  He is also on a regimen of Toujeo and NovoLog now On his last visit he had reduced his NovoLog significantly and was having poor control Also despite taking 100 units of Lantus a day his fasting readings were over 200 previously He did not bring his monitor today for download but he claims that his blood sugars are overall better and only some over 200 especially in the morning; does not do any readings after dinner despite instructions He has still not been motivated to exercise, previously using exercise bike       Oral hypoglycemic drugs the patient is taking are: None, has renal dysfunction       INSULIN regimen is described as:  Novolog 30--20--40 Toujeo 60 units bid  Glucose monitoring:  done 1  times a day         Glucometer:  contour     Blood Glucose readings from   Am 200 (< 250) acl 110, acs 170-200, ? hs  Hypoglycemia:  minimal, he now feels a little hungry and shaky if blood sugar is near 100 especially at lunch      Glycemic control:   Lab Results  Component Value Date   HGBA1C 10.2* 12/13/2013   HGBA1C 11.8* 10/29/2013   Lab Results  Component Value Date   MICROALBUR 18.8* 11/20/2013   LDLCALC 49 10/29/2013   CREATININE 2.2* 01/30/2014    Self-care: The diet that the patient has been following is: tries to limit fat intake but tends to have more snacks     Meals: 3 meals per day.breakfast is either cereal or eggs and toast, lunch isusually a sandwich , dinnerand  6-7 pm        Exercise:  not doing exercise bike, previously  was doing 15-30 min        Dietician visit: Most recent: several years ago.               Compliance with the medical regimen: good Retinal exam: Most recent: 05/2013.    Weight history:  Wt Readings from Last 3 Encounters:  02/27/14 276 lb (125.193 kg)  01/30/14 268 lb 3.2 oz (121.655  kg)  01/22/14 269 lb 2 oz (122.074 kg)      Medication List       This list is accurate as of: 02/27/14  1:44 PM.  Always use your most recent med list.               amLODipine 5 MG tablet  Commonly known as:  NORVASC  Take 1 tablet (5 mg total) by mouth daily.     aspirin 81 MG tablet  Take 81 mg by mouth daily.     B-D ULTRAFINE III SHORT PEN 31G X 8 MM Misc  Generic drug:  Insulin Pen Needle     carvedilol 25 MG tablet  Commonly known as:  COREG  Take 25 mg by mouth 2 (two) times daily with a meal.     doxazosin 2 MG tablet  Commonly known as:  CARDURA  Take 1 tablet (2 mg total) by mouth daily.     Fish Oil 1000 MG Caps  Take by mouth 2 (two) times daily.     Glucose Blood Disk  Commonly known as:  BAYER BREEZE 2 TEST  Checks 3 times per day dx code 250.72     Insulin Glargine 300 UNIT/ML Sopn  Commonly known as:  TOUJEO  SOLOSTAR  Inject 70 Units into the skin 2 (two) times daily.     isosorbide mononitrate 30 MG 24 hr tablet  Commonly known as:  IMDUR  TAKE TWO TABLETS BY MOUTH ONCE DAILY     Loratadine 10 MG Caps  Take by mouth daily.     NOVOLOG 100 UNIT/ML injection  Generic drug:  insulin aspart  INJECT 20 UNITS INTO THE SKIN 2 TIMES DAILY AS DIRECTED     pantoprazole 40 MG tablet  Commonly known as:  PROTONIX  Take 40 mg by mouth daily.     torsemide 20 MG tablet  Commonly known as:  DEMADEX  TAKE TWO TABLETS BY MOUTH ONCE DAILY     VICTOZA 18 MG/3ML Sopn  Generic drug:  Liraglutide  Inject 1.2 mg into the skin daily. Inject once daily at the same time        Allergies: No Known Allergies  Past Medical History  Diagnosis Date  . Diabetes   . Hypertension   . CAD (coronary artery disease)   . Renal insufficiency   . Sleep apnea   . Palpitations   . Dyspnea   . Sinus bradycardia   . Obesity   . Allergy   . Kidney failure   . Arthritis   . Diabetes   . Anxiety   . Cancer     skin    Past Surgical History  Procedure Laterality Date  . Insert / replace / remove pacemaker    . Knee surgery    . Vein ligation and stripping    . 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 Cancer Mother     breast  . Heart attack Father     Social History:  reports that he has quit smoking. He does not have any smokeless tobacco history on file. His alcohol and drug histories are not on file.    Review of Systems       Lipids: Had been on a Lipitor since late 2014. Also had taken Gembrozil for several years without apparent side effects.  Both of these were  stopped because  of increased liver functions and LFT has been back to normal Currently taking only fish oil       Lab Results  Component Value Date   CHOL 152 01/30/2014   HDL 17.60* 01/30/2014   LDLCALC 49 10/29/2013   LDLDIRECT 62.4 01/30/2014   TRIG 333.0*  01/30/2014   CHOLHDL 9 01/30/2014       The blood pressure has been treated with various drugs. Previously on lisinopril and this is possibly stop by nephrologist. 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 Ankle jerks are absent bilaterally.          Diabetic foot exam:  No callus formation. Mild denudation of skin on left second toe superiorly Amputation of right third toe. Thickening and dryness of skin on the toes on the right Absent monofilament sensation in the feet Absent pedal pulses     Physical Examination:  BP 122/60  Pulse 88  Temp(Src) 98.2 F (36.8 C)  Resp 16  Ht 6\' 4"  (1.93 m)  Wt 276 lb (125.193 kg)  BMI 33.61 kg/m2  SpO2 97%  Not indicated    ASSESSMENT:  Diabetes type 2, uncontrolled with neuropathy. His blood sugars are improving with increasing his mealtime dose as well as basal insulin Also is taking Toujeo instead of Lantus He can however do much better with his exercise program and also diet He can increase his Victoza but currently is concerned about the out-of-pocket expense Discussed insulin pump options again and will need to get a C-peptide done; however this was above normal in 3/15 before he moved here and may not get coverage from Medicare  PLAN:   Continue Victoza 1.2 mg  Increase evening Toujeo insulin  Reduce morning NovoLog but consider increasing suppertime dose if postprandial readings are higher after supper; reminded him to check these  Recheck A1c on the next visit  Need followup lipids and consider adding low-dose fenofibrate especially  if LDL is still normal  Restart exercise and advised him to  do this consistently for obvious benefits  He will followup with nurse educator    Patient Instructions  Exercise daily  Please check blood sugars at least half the time about 2 hours after any meal and 3 times per week on waking up.  Please bring blood sugar monitor to each visit  Increase Toujeo to 80 at night and 85 if am sugar is still >150  Novolog 25 in am   Counseling time over 50% of today's 25 minute visit  David [name] 02/27/2014, 1:44 PM   Note: This office note was prepared with Estate agent. Any transcriptional errors that result from this process are unintentional.

## 2014-03-03 ENCOUNTER — Encounter: Payer: Medicare Other | Attending: Endocrinology | Admitting: Nutrition

## 2014-03-03 DIAGNOSIS — Z794 Long term (current) use of insulin: Secondary | ICD-10-CM | POA: Insufficient documentation

## 2014-03-03 DIAGNOSIS — Z713 Dietary counseling and surveillance: Secondary | ICD-10-CM | POA: Diagnosis not present

## 2014-03-03 DIAGNOSIS — E1149 Type 2 diabetes mellitus with other diabetic neurological complication: Secondary | ICD-10-CM | POA: Diagnosis not present

## 2014-03-03 DIAGNOSIS — E1165 Type 2 diabetes mellitus with hyperglycemia: Secondary | ICD-10-CM

## 2014-03-03 DIAGNOSIS — IMO0002 Reserved for concepts with insufficient information to code with codable children: Secondary | ICD-10-CM

## 2014-03-03 NOTE — Progress Notes (Signed)
David [name] and his wife are here to learn about insulin pump therapy.  We discussed the advantages and disadvantages of pump therapy, and what is required to be on a pump. He is not testing 4X/day, and we discussed the need to do this.  He will also come in for a C-peptide this week.   We looked at all of the pumps and he decided on the Medtronic. He was given a brochure, and paperwork was filled out and faxed in.   We discussed the need to learn to carb count, and he was told to call and make an appt. With the [facility] upstairs.  He was given their telephone number. He will call if questions.

## 2014-03-03 NOTE — Patient Instructions (Signed)
Read over instructions and to to website to learn more about pump therapy Call if questions.

## 2014-03-18 ENCOUNTER — Other Ambulatory Visit: Payer: Self-pay | Admitting: Internal Medicine

## 2014-03-19 ENCOUNTER — Ambulatory Visit (INDEPENDENT_AMBULATORY_CARE_PROVIDER_SITE_OTHER): Payer: Medicare Other | Admitting: Family

## 2014-03-19 ENCOUNTER — Encounter: Payer: Self-pay | Admitting: Family

## 2014-03-19 VITALS — BP 138/72 | Temp 98.1°F | Wt 280.2 lb

## 2014-03-19 DIAGNOSIS — H6123 Impacted cerumen, bilateral: Secondary | ICD-10-CM

## 2014-03-19 DIAGNOSIS — I2510 Atherosclerotic heart disease of native coronary artery without angina pectoris: Secondary | ICD-10-CM | POA: Diagnosis not present

## 2014-03-19 DIAGNOSIS — K5900 Constipation, unspecified: Secondary | ICD-10-CM

## 2014-03-19 NOTE — Progress Notes (Signed)
Pre visit review using our clinic review tool, if applicable. No additional management support is needed unless otherwise documented below in the visit note. 

## 2014-03-19 NOTE — Patient Instructions (Signed)
1. 1 BOTTLE OF MAGNESIUM CITRATE TODAY X 1.  Constipation Constipation is when a person has fewer than three bowel movements a week, has difficulty having a bowel movement, or has stools that are dry, hard, or larger than normal. As people grow older, constipation is more common. If you try to fix constipation with medicines that make you have a bowel movement (laxatives), the problem may get worse. Long-term laxative use may cause the muscles of the colon to become weak. A low-fiber diet, not taking in enough fluids, and taking certain medicines may make constipation worse.  CAUSES   Certain medicines, such as antidepressants, pain medicine, iron supplements, antacids, and water pills.   Certain diseases, such as diabetes, irritable bowel syndrome (IBS), thyroid disease, or depression.   Not drinking enough water.   Not eating enough fiber-rich foods.   Stress or travel.   Lack of physical activity or exercise.   Ignoring the urge to have a bowel movement.   Using laxatives too much.  SIGNS AND SYMPTOMS   Having fewer than three bowel movements a week.   Straining to have a bowel movement.   Having stools that are hard, dry, or larger than normal.   Feeling full or bloated.   Pain in the lower abdomen.   Not feeling relief after having a bowel movement.  DIAGNOSIS  Your health care provider will take a medical history and perform a physical exam. Further testing may be done for severe constipation. Some tests may include:  A barium enema X-ray to examine your rectum, colon, and, sometimes, your small intestine.   A sigmoidoscopy to examine your lower colon.   A colonoscopy to examine your entire colon. TREATMENT  Treatment will depend on the severity of your constipation and what is causing it. Some dietary treatments include drinking more fluids and eating more fiber-rich foods. Lifestyle treatments may include regular exercise. If these diet and lifestyle  recommendations do not help, your health care provider may recommend taking over-the-counter laxative medicines to help you have bowel movements. Prescription medicines may be prescribed if over-the-counter medicines do not work.  HOME CARE INSTRUCTIONS   Eat foods that have a lot of fiber, such as fruits, vegetables, whole grains, and beans.  Limit foods high in fat and processed sugars, such as french fries, hamburgers, cookies, candies, and soda.   A fiber supplement may be added to your diet if you cannot get enough fiber from foods.   Drink enough fluids to keep your urine clear or pale yellow.   Exercise regularly or as directed by your health care provider.   Go to the restroom when you have the urge to go. Do not hold it.   Only take over-the-counter or prescription medicines as directed by your health care provider. Do not take other medicines for constipation without talking to your health care provider first.  [place] IF:   You have bright red blood in your stool.   Your constipation lasts for more than 4 days or gets worse.   You have abdominal or rectal pain.   You have thin, pencil-like stools.   You have unexplained weight loss. MAKE SURE YOU:   Understand these instructions.  Will watch your condition.  Will get help right away if you are not doing well or get worse. Document Released: 01/29/2004 Document Revised: 05/07/2013 Document Reviewed: 02/11/2013 [facility] Patient Information 2015 [place], [place]. This information is not intended to replace advice given to you by  your health care provider. Make sure you discuss any questions you have with your health care provider.  

## 2014-03-19 NOTE — Progress Notes (Signed)
Subjective:    Patient ID: David [name], male    DOB: 09-Feb-1941, 73 y.o.   MRN: [id number]  HPI 73 year old white male, nonsmoker, is in today with c/o constipation x 3 days. Wife reports that he had a small bowel movement 3 days ago after having no bowel movement 4 days. He has not had a bowel movement in the last 3 days. Tried a fleets enema with success, Benefiber and Colace. Still does not feel like he's fully evacuated. Denies any fever, abdominal pain, chills. No new medications.  Also has concerns of cerumen impaction bilaterally. Is requesting a have his ears cleaned out. Reports decreased hearing but his ears are clogged..  Review of Systems  Constitutional: Negative.   HENT: Negative.   Respiratory: Negative.   Cardiovascular: Negative.   Gastrointestinal: Positive for constipation. Negative for nausea and vomiting.  Genitourinary: Negative.   Musculoskeletal: Negative.   Skin: Negative.   Allergic/Immunologic: Negative.   Neurological: Negative.   Hematological: Negative.   Psychiatric/Behavioral: Negative.    Past Medical History  Diagnosis Date  . Diabetes   . Hypertension   . CAD (coronary artery disease)   . Renal insufficiency   . Sleep apnea   . Palpitations   . Dyspnea   . Sinus bradycardia   . Obesity   . Allergy   . Kidney failure   . Arthritis   . Diabetes   . Anxiety   . Cancer     skin    History   Social History  . Marital Status: Married    Spouse Name: N/A    Number of Children: N/A  . Years of Education: N/A   Occupational History  . Not on file.   Social History Main Topics  . Smoking status: Former Research scientist (life sciences)  . Smokeless tobacco: Not on file  . Alcohol Use: Not on file  . Drug Use: Not on file  . Sexual Activity: Not on file   Other Topics Concern  . Not on file   Social History Narrative  . No narrative on file    Past Surgical History  Procedure Laterality Date  . Insert / replace / remove pacemaker    . Knee  surgery    . Vein ligation and stripping    . 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 Cancer Mother     breast  . Heart attack Father     No Known Allergies  Current Outpatient Prescriptions on File Prior to Visit  Medication Sig Dispense Refill  . amLODipine (NORVASC) 5 MG tablet Take 1 tablet (5 mg total) by mouth daily. 180 tablet 2  . aspirin 81 MG tablet Take 81 mg by mouth daily.    . B-D ULTRAFINE III SHORT PEN 31G X 8 MM MISC     . carvedilol (COREG) 25 MG tablet Take 25 mg by mouth 2 (two) times daily with a meal.    . doxazosin (CARDURA) 2 MG tablet Take 1 tablet (2 mg total) by mouth daily. 90 tablet 0  . Glucose Blood (BAYER BREEZE 2 TEST) DISK Checks 3 times per day dx code 250.72 100 each 3  . Insulin Glargine (TOUJEO SOLOSTAR) 300 UNIT/ML SOPN Inject 70 Units into the skin 2 (two) times daily. 4 pen 2  . isosorbide mononitrate (IMDUR) 30 MG 24 hr tablet TAKE TWO TABLETS BY MOUTH ONCE DAILY  60 tablet 0  . Liraglutide (VICTOZA) 18 MG/3ML SOPN Inject 1.2 mg into the skin daily. Inject once daily at the same time    . Loratadine 10 MG CAPS Take by mouth daily.    . NOVOLOG 100 UNIT/ML injection INJECT 20 UNITS INTO THE SKIN 2 TIMES DAILY AS DIRECTED 30 mL 2  . Omega-3 Fatty Acids (FISH OIL) 1000 MG CAPS Take by mouth 2 (two) times daily.    . pantoprazole (PROTONIX) 40 MG tablet Take 40 mg by mouth daily.     . torsemide (DEMADEX) 20 MG tablet TAKE TWO TABLETS BY MOUTH ONCE DAILY 60 tablet 6   No current facility-administered medications on file prior to visit.    BP 138/72 mmHg  Temp(Src) 98.1 F (36.7 C) (Oral)  Wt 280 lb 3.2 oz (127.098 kg)chart    Objective:   Physical Exam  Constitutional: He is oriented to person, place, and time. He appears well-developed and well-nourished.  HENT:  Right Ear: External ear normal.  Left Ear: External ear normal.  Nose: Nose normal.  Mouth/Throat: Oropharynx  is clear and moist.  Neck: Normal range of motion. Neck supple.  Cardiovascular: Normal rate, regular rhythm and normal heart sounds.   Pulmonary/Chest: Effort normal and breath sounds normal.  Abdominal: Soft. Bowel sounds are normal. There is no tenderness.  Musculoskeletal: Normal range of motion.  Neurological: He is alert and oriented to person, place, and time.  Skin: Skin is warm and dry.  Psychiatric: He has a normal mood and affect.      Informed consent was obtained and peroxide gel was inserted into the ears bilaterally using the lavage kit the ears were lavaged until clean.Inspection with a cerumen spoon removed residual wax. Patient tolerated the procedure well.     Assessment & Plan:  David [name] was seen today for constipation.  Diagnoses and associated orders for this visit:  Constipation, unspecified constipation type  Cerumen impaction, bilateral   suggest over-the-counter magnesium titrate one bottle by mouth today. Maintain with Colace daily to prevent constipation. Increase water intake. Also advised over-the-counter the Debrox ear wax removal kit as needed for cerumen impaction in the future. 

## 2014-03-24 ENCOUNTER — Other Ambulatory Visit (INDEPENDENT_AMBULATORY_CARE_PROVIDER_SITE_OTHER): Payer: Medicare Other

## 2014-03-24 DIAGNOSIS — E782 Mixed hyperlipidemia: Secondary | ICD-10-CM | POA: Diagnosis not present

## 2014-03-24 DIAGNOSIS — E780 Pure hypercholesterolemia: Secondary | ICD-10-CM

## 2014-03-24 DIAGNOSIS — E1159 Type 2 diabetes mellitus with other circulatory complications: Secondary | ICD-10-CM | POA: Diagnosis not present

## 2014-03-24 DIAGNOSIS — E1151 Type 2 diabetes mellitus with diabetic peripheral angiopathy without gangrene: Secondary | ICD-10-CM

## 2014-03-24 LAB — LIPID PANEL
Cholesterol: 168 mg/dL (ref 0–200)
HDL: 20.3 mg/dL — ABNORMAL LOW (ref >39.00)
NonHDL: 147.7
Total CHOL/HDL Ratio: 8
Triglycerides: 235 mg/dL — ABNORMAL HIGH (ref 0.0–149.0)
VLDL: 47 mg/dL — ABNORMAL HIGH (ref 0.0–40.0)

## 2014-03-24 LAB — HEMOGLOBIN A1C: Hgb A1c MFr Bld: 8.5 % — ABNORMAL HIGH (ref 4.6–6.5)

## 2014-03-24 LAB — RENAL FUNCTION PANEL
Albumin: 3 g/dL — ABNORMAL LOW (ref 3.5–5.2)
BUN: 34 mg/dL — ABNORMAL HIGH (ref 6–23)
CO2: 26 mEq/L (ref 19–32)
Calcium: 9 mg/dL (ref 8.4–10.5)
Chloride: 105 mEq/L (ref 96–112)
Creatinine, Ser: 1.9 mg/dL — ABNORMAL HIGH (ref 0.4–1.5)
GFR: 38.21 mL/min — ABNORMAL LOW (ref >60.00)
Glucose, Bld: 160 mg/dL — ABNORMAL HIGH (ref 70–99)
Phosphorus: 2.9 mg/dL (ref 2.3–4.6)
Potassium: 4.4 mEq/L (ref 3.5–5.1)
Sodium: 139 mEq/L (ref 135–145)

## 2014-03-24 LAB — LDL CHOLESTEROL, DIRECT: Direct LDL: 71.7 mg/dL

## 2014-03-25 LAB — C-PEPTIDE: C-Peptide: 1.45 ng/mL (ref 0.80–3.90)

## 2014-03-26 NOTE — Progress Notes (Signed)
Quick Note:  Please let patient know that the C-peptide and kidney function will qualify him for the pump ______

## 2014-03-31 ENCOUNTER — Other Ambulatory Visit: Payer: Self-pay | Admitting: *Deleted

## 2014-03-31 MED ORDER — GLUCOSE BLOOD VI DISK: DISK | Status: DC

## 2014-04-14 DIAGNOSIS — M2021 Hallux rigidus, right foot: Secondary | ICD-10-CM | POA: Diagnosis not present

## 2014-04-14 DIAGNOSIS — L97529 Non-pressure chronic ulcer of other part of left foot with unspecified severity: Secondary | ICD-10-CM | POA: Diagnosis not present

## 2014-04-21 ENCOUNTER — Telehealth: Payer: Self-pay | Admitting: Endocrinology

## 2014-04-21 NOTE — Telephone Encounter (Signed)
Schedule pump start with Vaughan Basta and myself for 3 days in a row

## 2014-04-21 NOTE — Telephone Encounter (Signed)
Please see below and advise.

## 2014-04-21 NOTE — Telephone Encounter (Signed)
Patient has received his pump and need the insulin for it. Please advise

## 2014-04-22 DIAGNOSIS — E1140 Type 2 diabetes mellitus with diabetic neuropathy, unspecified: Secondary | ICD-10-CM | POA: Diagnosis not present

## 2014-04-22 DIAGNOSIS — L602 Onychogryphosis: Secondary | ICD-10-CM | POA: Diagnosis not present

## 2014-04-22 DIAGNOSIS — L84 Corns and callosities: Secondary | ICD-10-CM | POA: Diagnosis not present

## 2014-04-22 DIAGNOSIS — L97529 Non-pressure chronic ulcer of other part of left foot with unspecified severity: Secondary | ICD-10-CM | POA: Diagnosis not present

## 2014-04-25 ENCOUNTER — Other Ambulatory Visit: Payer: Self-pay | Admitting: Endocrinology

## 2014-04-28 ENCOUNTER — Ambulatory Visit (INDEPENDENT_AMBULATORY_CARE_PROVIDER_SITE_OTHER): Payer: Medicare Other | Admitting: Endocrinology

## 2014-04-28 ENCOUNTER — Encounter: Payer: Medicare Other | Attending: Endocrinology | Admitting: Nutrition

## 2014-04-28 ENCOUNTER — Telehealth: Payer: Self-pay | Admitting: Family

## 2014-04-28 ENCOUNTER — Encounter: Payer: Medicare Other | Admitting: *Deleted

## 2014-04-28 ENCOUNTER — Other Ambulatory Visit: Payer: Self-pay | Admitting: *Deleted

## 2014-04-28 ENCOUNTER — Telehealth: Payer: Self-pay | Admitting: Cardiology

## 2014-04-28 DIAGNOSIS — Z713 Dietary counseling and surveillance: Secondary | ICD-10-CM | POA: Insufficient documentation

## 2014-04-28 DIAGNOSIS — E1149 Type 2 diabetes mellitus with other diabetic neurological complication: Secondary | ICD-10-CM

## 2014-04-28 DIAGNOSIS — Z794 Long term (current) use of insulin: Secondary | ICD-10-CM | POA: Insufficient documentation

## 2014-04-28 DIAGNOSIS — E1165 Type 2 diabetes mellitus with hyperglycemia: Secondary | ICD-10-CM

## 2014-04-28 DIAGNOSIS — I2510 Atherosclerotic heart disease of native coronary artery without angina pectoris: Secondary | ICD-10-CM | POA: Diagnosis not present

## 2014-04-28 DIAGNOSIS — IMO0002 Reserved for concepts with insufficient information to code with codable children: Secondary | ICD-10-CM

## 2014-04-28 MED ORDER — LIRAGLUTIDE 18 MG/3ML ~~LOC~~ SOPN: 1.8000 mg | PEN_INJECTOR | Freq: Every day | SUBCUTANEOUS | Status: DC

## 2014-04-28 NOTE — Telephone Encounter (Signed)
Confirmed remote transmission w/ pt wife.   

## 2014-04-28 NOTE — Progress Notes (Signed)
Patient ID: David [name], male   DOB: [birth year], 73 y.o.   MRN: [id number]    Reason for Appointment: Followup for Type 2 Diabetes  Referring physician: Megan Salon  History of Present Illness:          Diagnosis: Type 2 diabetes mellitus, date of diagnosis:   1992       Past history:  He was initially treated with metformin and at some point also glipizide. His previous records from out of town are not available and no information is available about his level of control Apparently he was started on insulin in 1994 approximately because of poor control He has been on various insulin regimens over the last several years However even with insulin he has had poor control for at least the last 7 or 8 years. He does not know what his previous A1c levels have been. He had been continued on metformin and glipizide but metformin stopped about 2 years ago because of kidney function abnormality He had been taking Lantus 60 units twice a day with NovoLog previously and also Before his initial consultation in 6/15 he was on NovoLog twice a day and Humalog mix insulin  Because of poor control and large insulin doses he was started on Victoza in 6/15  Recent history:  He is on a regimen of Toujeo and NovoLog with 1.2 mg Victoza Today he is going to start a Medtronic insulin pump because of persistent poor control and high insulin requirement On his last visit his Toujeo was increased to 70 units twice a day However he still does not take the amount of NovoLog that was prescribed and is taking only 25 units with each meal even though his dose was 40 units at suppertime Also has not checked any readings after evening meal He is tolerating 1.2 mg Victoza but has not tried a higher dose before Unable to review his home readings today as he did not bring his meter He has still not been motivated to exercise, previously using exercise bike       Oral hypoglycemic drugs the patient is taking  are: None, has renal dysfunction       INSULIN regimen is described as:  Novolog 25 units 3 times a day before meals.  Toujeo 70 units bid  Glucose monitoring:  done 1 times a day         Glucometer:  contour     Blood Glucose readings from recall:  Am 250; acl 200, acs 250   Hypoglycemia:  minimal, he now feels a little hungry and shaky if blood sugar is near 100 especially at lunch      Glycemic control:   Lab Results  Component Value Date   HGBA1C 8.5* 03/24/2014   HGBA1C 10.2* 12/13/2013   HGBA1C 11.8* 10/29/2013   Lab Results  Component Value Date   MICROALBUR 18.8* 11/20/2013   LDLCALC 49 10/29/2013   CREATININE 1.9* 03/24/2014    Self-care: The diet that the patient has been following is: tries to limit fat intake but tends to have more snacks     Meals: 3 meals per day.breakfast is either cereal or eggs and toast, lunch isusually a sandwich , dinnerand  6-7 pm        Exercise:  not doing exercise bike, previously  was doing 15-30 min        Dietician visit: Most recent: several years ago.  Compliance with the medical regimen: good Retinal exam: Most recent: 05/2013.    Weight history:  Wt Readings from Last 3 Encounters:  03/19/14 280 lb 3.2 oz (127.098 kg)  02/27/14 276 lb (125.193 kg)  01/30/14 268 lb 3.2 oz (121.655 kg)      Medication List       This list is accurate as of: 04/28/14 10:06 AM.  Always use your most recent med list.               amLODipine 5 MG tablet  Commonly known as:  NORVASC  Take 1 tablet (5 mg total) by mouth daily.     aspirin 81 MG tablet  Take 81 mg by mouth daily.     B-D ULTRAFINE III SHORT PEN 31G X 8 MM Misc  Generic drug:  Insulin Pen Needle     carvedilol 25 MG tablet  Commonly known as:  COREG  Take 25 mg by mouth 2 (two) times daily with a meal.     doxazosin 2 MG tablet  Commonly known as:  CARDURA  Take 1 tablet (2 mg total) by mouth daily.     Fish Oil 1000 MG Caps  Take by mouth 2 (two)  times daily.     Glucose Blood Disk  Commonly known as:  BAYER BREEZE 2 TEST  Checks 3 times per day dx code E11.59     Insulin Glargine 300 UNIT/ML Sopn  Commonly known as:  TOUJEO SOLOSTAR  Inject 70 Units into the skin 2 (two) times daily.     LANTUS SOLOSTAR 100 UNIT/ML Solostar Pen  Generic drug:  Insulin Glargine  INJECT 60 UNITS SUBCUTANEOUSLY INTO THE SKIN  TWICE DAILY     isosorbide mononitrate 30 MG 24 hr tablet  Commonly known as:  IMDUR  TAKE TWO TABLETS BY MOUTH ONCE DAILY     isosorbide mononitrate 30 MG 24 hr tablet  Commonly known as:  IMDUR  TAKE TWO TABLETS BY MOUTH ONCE DAILY     Loratadine 10 MG Caps  Take by mouth daily.     NOVOLOG 100 UNIT/ML injection  Generic drug:  insulin aspart  INJECT 20 UNITS INTO THE SKIN 2 TIMES DAILY AS DIRECTED     pantoprazole 40 MG tablet  Commonly known as:  PROTONIX  Take 40 mg by mouth daily.     torsemide 20 MG tablet  Commonly known as:  DEMADEX  TAKE TWO TABLETS BY MOUTH ONCE DAILY     VICTOZA 18 MG/3ML Sopn  Generic drug:  Liraglutide  Inject 1.2 mg into the skin daily. Inject once daily at the same time        Allergies: No Known Allergies  Past Medical History  Diagnosis Date  . Diabetes   . Hypertension   . CAD (coronary artery disease)   . Renal insufficiency   . Sleep apnea   . Palpitations   . Dyspnea   . Sinus bradycardia   . Obesity   . Allergy   . Kidney failure   . Arthritis   . Diabetes   . Anxiety   . Cancer     skin    Past Surgical History  Procedure Laterality Date  . Insert / replace / remove pacemaker    . Knee surgery    . Vein ligation and stripping    . 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 Cancer Mother     breast  . Heart attack Father     Social History:  reports that he has quit smoking. He does not have any smokeless tobacco history on file. His alcohol and drug histories are not on  file.    Review of Systems   Review of systems unchanged from previous visit of 03/24/14      Lipids: Had been on a Lipitor since late 2014. Also had taken Gembrozil for several years without apparent side effects.  Both of these were  stopped because of increased liver functions and LFT has been back to normal Currently taking only fish oil       Lab Results  Component Value Date   CHOL 168 03/24/2014   HDL 20.30* 03/24/2014   LDLCALC 49 10/29/2013   LDLDIRECT 71.7 03/24/2014   TRIG 235.0* 03/24/2014   CHOLHDL 8 03/24/2014       The blood pressure has been treated with various drugs. Previously on lisinopril and this is possibly stop by nephrologist. 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 Ankle jerks are absent bilaterally.          Diabetic foot exam:  No callus formation. Mild denudation of skin on left second toe superiorly Amputation of right third toe. Thickening and dryness of skin on the toes on the right Absent monofilament sensation in the feet Absent pedal pulses     Physical Examination:  There were no vitals taken for this visit.  Not indicated    ASSESSMENT:  Diabetes type 2, uncontrolled with neuropathy. His blood sugars are still looking poorly controlled especially with not taking enough mealtime insulin Currently he is taking about 210 units of insulin total with poor control and not clear if he is benefiting enough from Colon also She is starting on the insulin pump and this should help require less insulin, will reduce his basal insulin requirement to  about 80 units a day for now  PLAN:   Pump settings will be: Carbohydrate coverage 1:5, correction 1:15 with target 120-150 and basal rate 3.2 round the clock  Victoza 1.8 mg daily  Mahrosh Donnell 04/28/2014, 10:06 AM   Note: This office note was prepared with Estate agent. Any transcriptional errors that result from this process are unintentional.

## 2014-04-28 NOTE — Patient Instructions (Signed)
Victoza 1.8mg  daily

## 2014-04-28 NOTE — Telephone Encounter (Signed)
Pt has an order to have blood work renal panel etc from dr coladonato,joseph. Can I sch? Pt will order

## 2014-04-29 ENCOUNTER — Ambulatory Visit (INDEPENDENT_AMBULATORY_CARE_PROVIDER_SITE_OTHER): Payer: Medicare Other | Admitting: Endocrinology

## 2014-04-29 ENCOUNTER — Encounter: Payer: Self-pay | Admitting: Endocrinology

## 2014-04-29 ENCOUNTER — Encounter: Payer: Medicare Other | Admitting: Nutrition

## 2014-04-29 DIAGNOSIS — I2510 Atherosclerotic heart disease of native coronary artery without angina pectoris: Secondary | ICD-10-CM

## 2014-04-29 DIAGNOSIS — E1149 Type 2 diabetes mellitus with other diabetic neurological complication: Secondary | ICD-10-CM | POA: Diagnosis not present

## 2014-04-29 DIAGNOSIS — E1165 Type 2 diabetes mellitus with hyperglycemia: Principal | ICD-10-CM

## 2014-04-29 DIAGNOSIS — IMO0002 Reserved for concepts with insufficient information to code with codable children: Secondary | ICD-10-CM

## 2014-04-29 NOTE — Patient Instructions (Signed)
Test blood sugars before meals, 2hr. After meals, bedtime, and 3AM. Call office if  Blood sugars remain high, or if they drop low.

## 2014-04-29 NOTE — Telephone Encounter (Signed)
Ok to schedule.

## 2014-04-29 NOTE — Patient Instructions (Signed)
New settings to be done

## 2014-04-29 NOTE — Progress Notes (Addendum)
Patient ID: David [name], male   DOB: 08-Jun-1940, 73 y.o.   MRN: [id number]    Reason for Appointment: Followup for Type 2 Diabetes  Referring physician: Megan Salon  History of Present Illness:          Diagnosis: Type 2 diabetes mellitus, date of diagnosis:   1992       Past history:  He was initially treated with metformin and at some point also glipizide. His previous records from out of town are not available and no information is available about his level of control Apparently he was started on insulin in 1994 approximately because of poor control He has been on various insulin regimens over the last several years However even with insulin he has had poor control for at least the last 7 or 8 years. He does not know what his previous A1c levels have been. He had been continued on metformin and glipizide but metformin stopped about 2 years ago because of kidney function abnormality He had been taking Lantus 60 units twice a day with NovoLog previously and also Before his initial consultation in 6/15 he was on NovoLog twice a day and Humalog mix insulin  Because of poor control and large insulin doses he was started on Victoza in 6/15  Recent history:  Since 04/28/14 he has been on a Medtronic insulin pump because of persistent poor control and high insulin requirement Since starting the pump his blood sugars have been excellent as below He has tried to watch his diet and is avoiding snacking and eating 3 separate meals Appears to have done well with carbohydrate counting also He is also tolerating 1.8 mg Victoza since yesterday       Oral hypoglycemic drugs the patient is taking are: None, has renal dysfunction       INSULIN PUMP  regimen is  Basal rate = 3.2; Carbohydrate coverage 1:5, correction 1:15 with target 120-150   Glucose monitoring:  done 1 times a day         Glucometer:  contour     Blood Glucose readings from recall:  Am 116  Today...163 pcl on 12/14, 83  acs, 126 pcs and  155hs, No reading overnight  Hypoglycemia:  minimal, will feel a  little hungry and shaky if blood sugar is near 100 especially at lunch      Glycemic control:   Lab Results  Component Value Date   HGBA1C 8.5* 03/24/2014   HGBA1C 10.2* 12/13/2013   HGBA1C 11.8* 10/29/2013   Lab Results  Component Value Date   MICROALBUR 18.8* 11/20/2013   LDLCALC 49 10/29/2013   CREATININE 1.9* 03/24/2014    Self-care: The diet that the patient has been following is: tries to limit fat intake but tends to have more snacks     Meals: 3 meals per day.breakfast is either cereal or eggs and toast, lunch isusually a sandwich , dinnerand  6-7 pm        Exercise:  not doing exercise bike, previously  was doing 15-30 min        Dietician visit: Most recent: several years ago.               Compliance with the medical regimen: good Retinal exam: Most recent: 05/2013.    Weight history:  Wt Readings from Last 3 Encounters:  03/19/14 280 lb 3.2 oz (127.098 kg)  02/27/14 276 lb (125.193 kg)  01/30/14 268 lb 3.2 oz (121.655 kg)  Medication List       This list is accurate as of: 04/29/14  9:55 AM.  Always use your most recent med list.               amLODipine 5 MG tablet  Commonly known as:  NORVASC  Take 1 tablet (5 mg total) by mouth daily.     aspirin 81 MG tablet  Take 81 mg by mouth daily.     B-D ULTRAFINE III SHORT PEN 31G X 8 MM Misc  Generic drug:  Insulin Pen Needle     carvedilol 25 MG tablet  Commonly known as:  COREG  Take 25 mg by mouth 2 (two) times daily with a meal.     doxazosin 2 MG tablet  Commonly known as:  CARDURA  Take 1 tablet (2 mg total) by mouth daily.     Fish Oil 1000 MG Caps  Take by mouth 2 (two) times daily.     Glucose Blood Disk  Commonly known as:  BAYER BREEZE 2 TEST  Checks 3 times per day dx code E11.59     Insulin Glargine 300 UNIT/ML Sopn  Commonly known as:  TOUJEO SOLOSTAR  Inject 70 Units into the skin 2 (two)  times daily.     LANTUS SOLOSTAR 100 UNIT/ML Solostar Pen  Generic drug:  Insulin Glargine  INJECT 60 UNITS SUBCUTANEOUSLY INTO THE SKIN  TWICE DAILY     isosorbide mononitrate 30 MG 24 hr tablet  Commonly known as:  IMDUR  TAKE TWO TABLETS BY MOUTH ONCE DAILY     isosorbide mononitrate 30 MG 24 hr tablet  Commonly known as:  IMDUR  TAKE TWO TABLETS BY MOUTH ONCE DAILY     Liraglutide 18 MG/3ML Sopn  Commonly known as:  VICTOZA  Inject 1.8 mg into the skin daily. Inject once daily at the same time     Loratadine 10 MG Caps  Take by mouth daily.     NOVOLOG 100 UNIT/ML injection  Generic drug:  insulin aspart  INJECT 20 UNITS INTO THE SKIN 2 TIMES DAILY AS DIRECTED     pantoprazole 40 MG tablet  Commonly known as:  PROTONIX  Take 40 mg by mouth daily.     torsemide 20 MG tablet  Commonly known as:  DEMADEX  TAKE TWO TABLETS BY MOUTH ONCE DAILY        Allergies: No Known Allergies  Past Medical History  Diagnosis Date  . Diabetes   . Hypertension   . CAD (coronary artery disease)   . Renal insufficiency   . Sleep apnea   . Palpitations   . Dyspnea   . Sinus bradycardia   . Obesity   . Allergy   . Kidney failure   . Arthritis   . Diabetes   . Anxiety   . Cancer     skin    Past Surgical History  Procedure Laterality Date  . Insert / replace / remove pacemaker    . Knee surgery    . Vein ligation and stripping    . 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 Cancer Mother     breast  . Heart attack Father     Social History:  reports that he has quit smoking. He does not have any smokeless tobacco history on file. His alcohol and drug histories are not on file.  Review of Systems   Review of systems unchanged from previous visit of12/14/15     Lipids: Had been on a Lipitor since late 2014. Also had taken Gembrozil for several years without apparent side effects.  Both of these were   stopped because of increased liver functions and LFT has been back to normal Currently taking only fish oil       Lab Results  Component Value Date   CHOL 168 03/24/2014   HDL 20.30* 03/24/2014   LDLCALC 49 10/29/2013   LDLDIRECT 71.7 03/24/2014   TRIG 235.0* 03/24/2014   CHOLHDL 8 03/24/2014       The blood pressure has been treated with various drugs. Previously on lisinopril and this is possibly stop by nephrologist. 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 Ankle jerks are absent bilaterally.          Diabetic foot exam:  No callus formation. Mild denudation of skin on left second toe superiorly Amputation of right third toe. Thickening and dryness of skin on the toes on the right Absent monofilament sensation in the feet Absent pedal pulses     Physical Examination:  There were no vitals taken for this visit.  Not indicated    ASSESSMENT:  Diabetes type 2, uncontrolled with neuropathy. His blood sugars are excellent with starting the insulin pump and he is using this without any difficulty His basal rate is giving him about 80 units a day compared to 140 units with subcutaneous injections He has been more particular about his diet and glucose monitoring as well His blood sugar patterns show relatively lower blood sugar before supper and overnight and his basal rate will be modified accordingly  PLAN:   Pump settings will be: Carbohydrate coverage 1:5, correction 1:15 with target 120-150 BASAL rate 3.0 until 6 AM and 3.2 until 3 PM, 3.0 3 PM-7 PM and then 3.2   he will need  to check his blood sugars 6 times a day as discussed   Follow-up tomorrow  [facility] 04/29/2014, 9:55 AM   Note: This office note was prepared with Dragon voice recognition system technology. Any transcriptional errors that result from this process are unintentional.

## 2014-04-29 NOTE — Telephone Encounter (Signed)
lmom for pt to cb

## 2014-04-29 NOTE — Progress Notes (Signed)
Pt. Reported no difficulty giving boluses or sleeping with this last night.  Was able to disconnect for shower this AM without difficulty.  After seeing Dr. Dwyane Dee, he changed his basal rate with little assistance from me.  We reviewed how to use the status screen and look for his IOB, and I explained what this was.  Both he hand his wife reported good understanding of this.  We reviewed again what he ate and he appears to be counting carbs accurately     We discussed alerts and alarms and he reported good understanding of this.   We will do a cartridge and infusion set change tomorrow and he will bring his supplies.   He reports that he is reading his manual over, and is understanding the topics and had no final questions for me.

## 2014-04-29 NOTE — Patient Instructions (Signed)
Continue to test blood sugars before meals, bedtime Continue to read the manual

## 2014-04-29 NOTE — Progress Notes (Signed)
The patient is here today with his wife to start a Medtronic Paradigm 757.   We discussed the difference between  Basal and bolus insulin and what all the buttons did.  Setting were put into the pump by the patient with some assistance from me.: Basal rate:  3.2u/hr, I/C ratio: 5, sensitivity: 15, timing: 4 hours,  He was shown how to give a bolus, and he re demonstrated this correctly 3 times.  We reivewed carb counting, and he was given a sheet with 15 gram carb portion sizes.  Both he and his wife reported good understanding of this We reviewed low blood sugars:  Symptoms and treatments and he was treating these with peanut butter sandwich and milk.  He was given a sample of glucose tablets and told to take 4, wait 15 min. And retest.  He agreed to do this.   He had no final questions.  I will call him this evening to see how he did.  He was told to call up here if blood sugars remain high or drop low.  His blood sugar was 331, and he took a correction dose of 21u. At 11AM.   He was given a sheet to record blood sugars and bolus doses.  He was told to test before meals, 2 hours after meals, HS, and 3 AM.  He agreed to do this. They had no final questions.

## 2014-04-30 ENCOUNTER — Ambulatory Visit (INDEPENDENT_AMBULATORY_CARE_PROVIDER_SITE_OTHER): Payer: Medicare Other | Admitting: Endocrinology

## 2014-04-30 ENCOUNTER — Encounter: Payer: Medicare Other | Admitting: Nutrition

## 2014-04-30 ENCOUNTER — Ambulatory Visit: Payer: Medicare Other | Admitting: Endocrinology

## 2014-04-30 ENCOUNTER — Other Ambulatory Visit: Payer: Self-pay | Admitting: *Deleted

## 2014-04-30 DIAGNOSIS — E1149 Type 2 diabetes mellitus with other diabetic neurological complication: Secondary | ICD-10-CM

## 2014-04-30 DIAGNOSIS — E1165 Type 2 diabetes mellitus with hyperglycemia: Principal | ICD-10-CM

## 2014-04-30 DIAGNOSIS — IMO0002 Reserved for concepts with insufficient information to code with codable children: Secondary | ICD-10-CM

## 2014-04-30 DIAGNOSIS — I2510 Atherosclerotic heart disease of native coronary artery without angina pectoris: Secondary | ICD-10-CM

## 2014-04-30 MED ORDER — GLUCOSE BLOOD VI STRP: ORAL_STRIP | Status: DC

## 2014-04-30 MED ORDER — INSULIN ASPART 100 UNIT/ML ~~LOC~~ SOLN: SUBCUTANEOUS | Status: DC

## 2014-04-30 NOTE — Progress Notes (Signed)
Patient and wife are here today, and we reviewed how/when to give a manual bolus, temp. Basal rates, sick days, and high blood sugar protocol.  He was given handouts on these topics to review.  He was shown how to make changes in the I/C ratios for lunch, and he changed it to 7, with some assistance from me.  He is having a HS snack of 60 grams of carbs and is not bolusing for this.  He is afraid to do this, and so he was given a 1u/8 gram ratio for late night eating.  He agreed to bolus now for this. He did a cartridge and infusion set change with some assistance from me.  He was given a handout with the steps on it to use on Sunday.  He was told to call customer care if he had any questions.    His blood sugar dropped low 1 hour after lunch today (87) with 5u of IOB.  He was shown how many grams of glucose to eat/drink for this. He was given 2 small cans of soda to treat this, and felt better in 30 min.  He signed off on understanding all topics and had no final questions.

## 2014-04-30 NOTE — Patient Instructions (Signed)
Do another cartridge change on Sunday, and every three days.   Call [employer] customer care if questions.

## 2014-04-30 NOTE — Progress Notes (Signed)
Patient ID: David [name], male   DOB: 28-Jan-1941, 73 y.o.   MRN: [id number]    Reason for Appointment: Followup for Type 2 Diabetes  Referring physician: Megan Salon  History of Present Illness:          Diagnosis: Type 2 diabetes mellitus, date of diagnosis:   1992       Past history:  He was initially treated with metformin and at some point also glipizide. His previous records from out of town are not available and no information is available about his level of control Apparently he was started on insulin in 1994 approximately because of poor control He has been on various insulin regimens over the last several years However even with insulin he has had poor control for at least the last 7 or 8 years. He does not know what his previous A1c levels have been. He had been continued on metformin and glipizide but metformin stopped about 2 years ago because of kidney function abnormality He had been taking Lantus 60 units twice a day with NovoLog previously and also Before his initial consultation in 6/15 he was on NovoLog twice a day and Humalog mix insulin  Because of poor control and large insulin doses he was started on Victoza in 6/15  Recent history:  Since 04/28/14 he has been on a Medtronic insulin pump because of persistent poor control and high insulin requirement Since starting the pump his blood sugars have been mostly in a good range He has tried to watch his diet and is avoiding snacking and eating 3 separate meals Trying to enter carbohydrates when he is eating and is able to estimate them fairly accurately He is continuing to get education from the diabetes educator daily this week His blood sugars were somewhat erratic yesterday are as follows: Blood sugar went up after breakfast and lunch to 170 and 207 respectively although at lunchtime he had a little more fat with [employer] and potato chips He appeared not to have done his bolus for his evening meal even though it  was a relatively small meals.  Blood sugar was 166 PC but with getting some cookies his blood sugar overnight was high, fasting 200 today. After lunch was having hypoglycemic symptoms as below He is also tolerating 1.8 mg Victoza        Oral hypoglycemic drugs the patient is taking are: None, has renal dysfunction       INSULIN PUMP  regimen is  Basal rate = 3.2 at 6 AM and 7 PM otherwise 3.0; Carbohydrate coverage 1:5, correction 1:15 with target 120-150   Glucose monitoring:  done 1 times a day         Glucometer:  contour     Blood Glucose readings as discussed above   Hypoglycemia:  he felt hypoglycemic after lunch today when blood sugar was 83, he had a relatively low-fat meal   Glycemic control:   Lab Results  Component Value Date   HGBA1C 8.5* 03/24/2014   HGBA1C 10.2* 12/13/2013   HGBA1C 11.8* 10/29/2013   Lab Results  Component Value Date   MICROALBUR 18.8* 11/20/2013   LDLCALC 49 10/29/2013   CREATININE 1.9* 03/24/2014    Self-care: The diet that the patient has been following is: tries to limit fat intake but tends to have more snacks     Meals: 3 meals per day.breakfast is either cereal or eggs and toast, lunch isusually a sandwich , dinnerand  6-7 pm  Exercise:  not doing exercise bike, previously  was doing 15-30 min        Dietician visit: Most recent: several years ago.               Compliance with the medical regimen: good Retinal exam: Most recent: 05/2013.    Weight history:  Wt Readings from Last 3 Encounters:  03/19/14 280 lb 3.2 oz (127.098 kg)  02/27/14 276 lb (125.193 kg)  01/30/14 268 lb 3.2 oz (121.655 kg)      Medication List       This list is accurate as of: 04/30/14  2:30 PM.  Always use your most recent med list.               amLODipine 5 MG tablet  Commonly known as:  NORVASC  Take 1 tablet (5 mg total) by mouth daily.     aspirin 81 MG tablet  Take 81 mg by mouth daily.     B-D ULTRAFINE III SHORT PEN 31G X 8 MM Misc   Generic drug:  Insulin Pen Needle     carvedilol 25 MG tablet  Commonly known as:  COREG  Take 25 mg by mouth 2 (two) times daily with a meal.     doxazosin 2 MG tablet  Commonly known as:  CARDURA  Take 1 tablet (2 mg total) by mouth daily.     Fish Oil 1000 MG Caps  Take by mouth 2 (two) times daily.     Glucose Blood Disk  Commonly known as:  BAYER BREEZE 2 TEST  Checks 3 times per day dx code E11.59     Insulin Glargine 300 UNIT/ML Sopn  Commonly known as:  TOUJEO SOLOSTAR  Inject 70 Units into the skin 2 (two) times daily.     LANTUS SOLOSTAR 100 UNIT/ML Solostar Pen  Generic drug:  Insulin Glargine  INJECT 60 UNITS SUBCUTANEOUSLY INTO THE SKIN  TWICE DAILY     isosorbide mononitrate 30 MG 24 hr tablet  Commonly known as:  IMDUR  TAKE TWO TABLETS BY MOUTH ONCE DAILY     isosorbide mononitrate 30 MG 24 hr tablet  Commonly known as:  IMDUR  TAKE TWO TABLETS BY MOUTH ONCE DAILY     Liraglutide 18 MG/3ML Sopn  Commonly known as:  VICTOZA  Inject 1.8 mg into the skin daily. Inject once daily at the same time     Loratadine 10 MG Caps  Take by mouth daily.     NOVOLOG 100 UNIT/ML injection  Generic drug:  insulin aspart  INJECT 20 UNITS INTO THE SKIN 2 TIMES DAILY AS DIRECTED     pantoprazole 40 MG tablet  Commonly known as:  PROTONIX  Take 40 mg by mouth daily.     torsemide 20 MG tablet  Commonly known as:  DEMADEX  TAKE TWO TABLETS BY MOUTH ONCE DAILY        Allergies: No Known Allergies  Past Medical History  Diagnosis Date  . Diabetes   . Hypertension   . CAD (coronary artery disease)   . Renal insufficiency   . Sleep apnea   . Palpitations   . Dyspnea   . Sinus bradycardia   . Obesity   . Allergy   . Kidney failure   . Arthritis   . Diabetes   . Anxiety   . Cancer     skin    Past Surgical History  Procedure Laterality Date  . Insert / replace /  remove pacemaker    . Knee surgery    . Vein ligation and stripping    . 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 Cancer Mother     breast  . Heart attack Father     Social History:  reports that he has quit smoking. He does not have any smokeless tobacco history on file. His alcohol and drug histories are not on file.    Review of Systems   Review of systems unchanged from previous visit of 04/29/14      Lipids: Had been on a Lipitor since late 2014. Also had taken Gembrozil for several years without apparent side effects.  Both of these were  stopped because of increased liver functions and LFT has been back to normal Currently taking only fish oil       Lab Results  Component Value Date   CHOL 168 03/24/2014   HDL 20.30* 03/24/2014   LDLCALC 49 10/29/2013   LDLDIRECT 71.7 03/24/2014   TRIG 235.0* 03/24/2014   CHOLHDL 8 03/24/2014       The blood pressure has been treated with various drugs. Previously on lisinopril and this is possibly stop by nephrologist. 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 Ankle jerks are absent bilaterally.          Diabetic foot exam:  No callus formation. Mild denudation of skin on left second toe superiorly Amputation of right third toe. Thickening and dryness of skin on the toes on the right Absent monofilament sensation in the feet Absent pedal pulses     Physical Examination:  There were no vitals taken for this visit.  Not indicated    ASSESSMENT:  Diabetes type 2, uncontrolled with neuropathy. His blood sugars are a little more variable on the second day of his insulin pump Having a  little inconsistent coverage of his meals with relatively high readings yesterday and lower readings after lunch today This may be related to variable fat intake Also he did not complete his bolus yesterday at suppertime Fasting glucose higher today but this has not been consistent and probably related to missed bolus last evening as well as eating cookies at bedtime  PLAN:   Pump settings will be: Carbohydrate coverage 1:7, correction 1:15 with target 120-150 BASAL rate the same as before: 3.0 until 6 AM and 3.2 until 3 PM, 3.0 3 PM-7 PM and then 3.2  He needs to add 2-3 units of insulin or extra carbohydrate when he is eating somewhat higher fat meals   he will need to check his blood sugars 6 times a day for the next couple of days   Follow-up blood sugar readings to be discussed on the phone tomorrow  Balanced meals with enough protein  Encouraged regular exercise  Maydell Knoebel 04/30/2014, 2:30 PM   Note: This office note was prepared with Dragon voice recognition system technology. Any transcriptional errors that result from this process are unintentional.

## 2014-04-30 NOTE — Patient Instructions (Signed)
Extra 10-20g for higher fat meals  Call me with sugars in am

## 2014-05-01 ENCOUNTER — Ambulatory Visit: Payer: Medicare Other | Admitting: Endocrinology

## 2014-05-01 NOTE — Telephone Encounter (Signed)
Patient B/S readings Morning breakfast 132,  After breakfast 184,  Before lunch 174,  After Lunch 167.

## 2014-05-01 NOTE — Telephone Encounter (Signed)
Pt has been sch

## 2014-05-02 ENCOUNTER — Other Ambulatory Visit (INDEPENDENT_AMBULATORY_CARE_PROVIDER_SITE_OTHER): Payer: Medicare Other

## 2014-05-02 ENCOUNTER — Encounter: Payer: Self-pay | Admitting: Cardiology

## 2014-05-02 DIAGNOSIS — L97529 Non-pressure chronic ulcer of other part of left foot with unspecified severity: Secondary | ICD-10-CM | POA: Diagnosis not present

## 2014-05-02 DIAGNOSIS — N183 Chronic kidney disease, stage 3 unspecified: Secondary | ICD-10-CM

## 2014-05-02 DIAGNOSIS — E1140 Type 2 diabetes mellitus with diabetic neuropathy, unspecified: Secondary | ICD-10-CM | POA: Diagnosis not present

## 2014-05-02 DIAGNOSIS — N2581 Secondary hyperparathyroidism of renal origin: Secondary | ICD-10-CM

## 2014-05-02 DIAGNOSIS — R809 Proteinuria, unspecified: Secondary | ICD-10-CM

## 2014-05-02 LAB — URINALYSIS
Bilirubin Urine: NEGATIVE
Ketones, ur: NEGATIVE
Leukocytes, UA: NEGATIVE
Nitrite: NEGATIVE
Specific Gravity, Urine: 1.01 (ref 1.000–1.030)
Urine Glucose: NEGATIVE
Urobilinogen, UA: 0.2 (ref 0.0–1.0)
pH: 6 (ref 5.0–8.0)

## 2014-05-02 LAB — COMPREHENSIVE METABOLIC PANEL
ALT: 25 U/L (ref 0–53)
AST: 21 U/L (ref 0–37)
Albumin: 3.7 g/dL (ref 3.5–5.2)
Alkaline Phosphatase: 93 U/L (ref 39–117)
BUN: 45 mg/dL — ABNORMAL HIGH (ref 6–23)
CO2: 25 mEq/L (ref 19–32)
Calcium: 8.9 mg/dL (ref 8.4–10.5)
Chloride: 104 mEq/L (ref 96–112)
Creatinine, Ser: 2 mg/dL — ABNORMAL HIGH (ref 0.4–1.5)
GFR: 35.53 mL/min — ABNORMAL LOW (ref >60.00)
Glucose, Bld: 123 mg/dL — ABNORMAL HIGH (ref 70–99)
Potassium: 4.5 mEq/L (ref 3.5–5.1)
Sodium: 138 mEq/L (ref 135–145)
Total Bilirubin: 0.7 mg/dL (ref 0.2–1.2)
Total Protein: 6.8 g/dL (ref 6.0–8.3)

## 2014-05-02 LAB — CBC WITH DIFFERENTIAL/PLATELET
Basophils Absolute: 0 10*3/uL (ref 0.0–0.1)
Basophils Relative: 0.3 % (ref 0.0–3.0)
Eosinophils Absolute: 0.2 10*3/uL (ref 0.0–0.7)
Eosinophils Relative: 2.2 % (ref 0.0–5.0)
HCT: 38.4 % — ABNORMAL LOW (ref 39.0–52.0)
Hemoglobin: 12.6 g/dL — ABNORMAL LOW (ref 13.0–17.0)
Lymphocytes Relative: 16.6 % (ref 12.0–46.0)
Lymphs Abs: 1.3 10*3/uL (ref 0.7–4.0)
MCHC: 32.8 g/dL (ref 30.0–36.0)
MCV: 91.7 fl (ref 78.0–100.0)
Monocytes Absolute: 0.7 10*3/uL (ref 0.1–1.0)
Monocytes Relative: 9.2 % (ref 3.0–12.0)
Neutro Abs: 5.7 10*3/uL (ref 1.4–7.7)
Neutrophils Relative %: 71.7 % (ref 43.0–77.0)
Platelets: 171 10*3/uL (ref 150.0–400.0)
RBC: 4.19 Mil/uL — ABNORMAL LOW (ref 4.22–5.81)
RDW: 15.8 % — ABNORMAL HIGH (ref 11.5–15.5)
WBC: 7.9 10*3/uL (ref 4.0–10.5)

## 2014-05-02 LAB — PHOSPHORUS: Phosphorus: 3.4 mg/dL (ref 2.3–4.6)

## 2014-05-02 LAB — HIGH SENSITIVITY CRP: CRP, High Sensitivity: 18.32 mg/L — ABNORMAL HIGH (ref 0.000–5.000)

## 2014-05-02 NOTE — Telephone Encounter (Signed)
lmtcb

## 2014-05-02 NOTE — Telephone Encounter (Signed)
Please see below.

## 2014-05-02 NOTE — Telephone Encounter (Signed)
Need to know current sugars

## 2014-05-05 LAB — PTH, INTACT AND CALCIUM
Calcium: 9.1 mg/dL (ref 8.4–10.5)
PTH: 123 pg/mL — ABNORMAL HIGH (ref 14–64)

## 2014-05-08 ENCOUNTER — Encounter: Payer: Self-pay | Admitting: Endocrinology

## 2014-05-08 ENCOUNTER — Ambulatory Visit (INDEPENDENT_AMBULATORY_CARE_PROVIDER_SITE_OTHER): Payer: Medicare Other | Admitting: Endocrinology

## 2014-05-08 VITALS — BP 165/65 | HR 77 | Temp 98.1°F | Resp 14 | Ht 76.0 in | Wt 279.8 lb

## 2014-05-08 DIAGNOSIS — E782 Mixed hyperlipidemia: Secondary | ICD-10-CM | POA: Diagnosis not present

## 2014-05-08 DIAGNOSIS — E1165 Type 2 diabetes mellitus with hyperglycemia: Principal | ICD-10-CM

## 2014-05-08 DIAGNOSIS — E1149 Type 2 diabetes mellitus with other diabetic neurological complication: Secondary | ICD-10-CM | POA: Diagnosis not present

## 2014-05-08 DIAGNOSIS — I10 Essential (primary) hypertension: Secondary | ICD-10-CM

## 2014-05-08 DIAGNOSIS — IMO0002 Reserved for concepts with insufficient information to code with codable children: Secondary | ICD-10-CM

## 2014-05-08 DIAGNOSIS — I2510 Atherosclerotic heart disease of native coronary artery without angina pectoris: Secondary | ICD-10-CM | POA: Diagnosis not present

## 2014-05-08 NOTE — Patient Instructions (Signed)
4mg  Doxazosin  More sugars after supper  Bolus for all snacks

## 2014-05-08 NOTE — Progress Notes (Signed)
Patient ID: David [name], male   DOB: 16-Jan-1941, 73 y.o.   MRN: [id number]    Reason for Appointment: Followup for Type 2 Diabetes  Referring physician: Megan Salon  History of Present Illness:          Diagnosis: Type 2 diabetes mellitus, date of diagnosis:   1992       Past history:  He was initially treated with metformin and at some point also glipizide. His previous records from out of town are not available and no information is available about his level of control Apparently he was started on insulin in 1994 approximately because of poor control He has been on various insulin regimens over the last several years However even with insulin he has had poor control for at least the last 7 or 8 years. He does not know what his previous A1c levels have been. He had been continued on metformin and glipizide but metformin stopped about 2 years ago because of kidney function abnormality He had been taking Lantus 60 units twice a day with NovoLog previously and also Before his initial consultation in 6/15 he was on NovoLog twice a day and Humalog mix insulin  Because of poor control and large insulin doses he was started on Victoza in 6/15  Recent history:  Since 04/28/14 he has been on a Medtronic insulin pump because of persistent poor control and high insulin requirement Since starting the pump his blood sugars have been overall much improved and he is requiring overall much less insulin He has tried to watch his diet and is avoiding snacking and eating 3 separate meals Able to enter carbohydrates when he is eating and is able to estimate them fairly accurately; however sometimes will not take a bolus for a bedtime snack He is fairly comfortable with doing the boluses although he has missed one bolus on Monday evening Also not able to change the basal rates by himself. He is also continuing to take 1.8 mg Victoza        Oral hypoglycemic drugs the patient is taking are: None,  has renal dysfunction        INSULIN PUMP  regimen is as follows: Basal rate = midnight = 3.0, 3.2 at 6 AM, 3.0 at 3 PM and 7 PM 3.2  Carbohydrate coverage 1:7 for lunch otherwise 1:5, correction 1:15 with target 120-150   Glucose monitoring:  done about 3 times a day         Glucometer:  contour     Blood Glucose readings   PRE-MEAL  a.m.  Lunch Dinner Bedtime Overall  Glucose range:  100-232   84-163   96-187   100-179    Mean/median:  160     144 +/-86    POST-MEAL PC Breakfast PC Lunch PC Dinner  Glucose range:     Mean/median:  164   157   142   Hypoglycemia: None recently, able to tolerate lower blood sugars now without symptoms  Glycemic control:   Lab Results  Component Value Date   HGBA1C 8.5* 03/24/2014   HGBA1C 10.2* 12/13/2013   HGBA1C 11.8* 10/29/2013   Lab Results  Component Value Date   MICROALBUR 18.8* 11/20/2013   LDLCALC 49 10/29/2013   CREATININE 2.0* 05/02/2014    Self-care: The diet that the patient has been following is: tries to limit fat intake   Meals: 3 meals per day.breakfast is either cereal or eggs and toast, lunch is usually a sandwich ,  dinner at  6-7 pm        Exercise:  not doing exercise bike recently, previously  was doing 15-30 min        Dietician visit: Most recent: several years ago.               Compliance with the medical regimen: good Retinal exam: Most recent: 05/2013.    Weight history:  Wt Readings from Last 3 Encounters:  05/08/14 279 lb 12.8 oz (126.916 kg)  03/19/14 280 lb 3.2 oz (127.098 kg)  02/27/14 276 lb (125.193 kg)      Medication List       This list is accurate as of: 05/08/14 10:47 AM.  Always use your most recent med list.               amLODipine 5 MG tablet  Commonly known as:  NORVASC  Take 1 tablet (5 mg total) by mouth daily.     aspirin 81 MG tablet  Take 81 mg by mouth daily.     B-D ULTRAFINE III SHORT PEN 31G X 8 MM Misc  Generic drug:  Insulin Pen Needle     carvedilol 25 MG  tablet  Commonly known as:  COREG  Take 25 mg by mouth 2 (two) times daily with a meal.     doxazosin 2 MG tablet  Commonly known as:  CARDURA  Take 1 tablet (2 mg total) by mouth daily.     Fish Oil 1000 MG Caps  Take by mouth 2 (two) times daily.     glucose blood test strip  Commonly known as:  BAYER CONTOUR NEXT TEST  Use as instructed to check blood sugar 6 times per day dx code E11.69     insulin aspart 100 UNIT/ML injection  Commonly known as:  NOVOLOG  Use max 120 units per day with insulin pump.     Insulin Glargine 300 UNIT/ML Sopn  Commonly known as:  TOUJEO SOLOSTAR  Inject 70 Units into the skin 2 (two) times daily.     LANTUS SOLOSTAR 100 UNIT/ML Solostar Pen  Generic drug:  Insulin Glargine  INJECT 60 UNITS SUBCUTANEOUSLY INTO THE SKIN  TWICE DAILY     isosorbide mononitrate 30 MG 24 hr tablet  Commonly known as:  IMDUR  TAKE TWO TABLETS BY MOUTH ONCE DAILY     isosorbide mononitrate 30 MG 24 hr tablet  Commonly known as:  IMDUR  TAKE TWO TABLETS BY MOUTH ONCE DAILY     Liraglutide 18 MG/3ML Sopn  Commonly known as:  VICTOZA  Inject 1.8 mg into the skin daily. Inject once daily at the same time     Loratadine 10 MG Caps  Take by mouth daily.     mupirocin cream 2 %  Commonly known as:  BACTROBAN     pantoprazole 40 MG tablet  Commonly known as:  PROTONIX  Take 40 mg by mouth daily.     torsemide 20 MG tablet  Commonly known as:  DEMADEX  TAKE TWO TABLETS BY MOUTH ONCE DAILY        Allergies: No Known Allergies  Past Medical History  Diagnosis Date  . Diabetes   . Hypertension   . CAD (coronary artery disease)   . Renal insufficiency   . Sleep apnea   . Palpitations   . Dyspnea   . Sinus bradycardia   . Obesity   . Allergy   . Kidney failure   . Arthritis   .  Diabetes   . Anxiety   . Cancer     skin    Past Surgical History  Procedure Laterality Date  . Insert / replace / remove pacemaker    . Knee surgery    . Vein  ligation and stripping    . 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 Cancer Mother     breast  . Heart attack Father     Social History:  reports that he has quit smoking. He does not have any smokeless tobacco history on file. His alcohol and drug histories are not on file.    Review of Systems       Lipids: Had been on a Lipitor since late 2014. Also had taken Gembrozil for several years without apparent side effects.  Both of these were  stopped because of increased liver functions and LFT has been back to normal LDL appears to be still well controlled Currently taking only fish oil Currently lipids are followed by PCP       Lab Results  Component Value Date   CHOL 168 03/24/2014   HDL 20.30* 03/24/2014   LDLCALC 49 10/29/2013   LDLDIRECT 71.7 03/24/2014   TRIG 235.0* 03/24/2014   CHOLHDL 8 03/24/2014       The blood pressure has been treated with various drugs.  Currently taking 2 mg doxazosin and amlodipine without control. 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 Ankle jerks are absent bilaterally.          Diabetic foot exam:  No callus formation. Mild denudation of skin on left second toe superiorly Amputation of right third toe. Thickening and dryness of skin on the toes on the right Absent monofilament sensation in the feet Absent pedal pulses     Physical Examination:  BP 165/65 mmHg  Pulse 77  Temp(Src) 98.1 F (36.7 C)  Resp 14  Ht 6\' 4"  (1.93 m)  Wt 279 lb 12.8 oz (126.916 kg)  BMI 34.07 kg/m2  SpO2 94%  Not indicated      ASSESSMENT:  Diabetes type 2, uncontrolled with neuropathy. His blood sugars are overall excellent considering his insulin doses are lower and he is just starting to get used to his insulin pump Blood sugars are fairly stable and only significant hyperglycemia appears to be on waking up Blood sugars are trending lower at lunch and suppertime He has not done as many postprandial readings recently Also he feels subjectively much better with current management and also improving his lifestyle with diet management He does need a little more and reinforcement of education regarding pump management including changing basal rates by himself He was instructed on how to change his basal rates today  PLAN:   Pump settings will continue to be same except basal rate at 6 AM = 3.3 and from 10 AM to 3 PM reduced to 2.9  Encouraged regular exercise  More postprandial readings, discussed postprandial targets  Cover snacks with boluses  A1c on the next visit  Hypertension: Blood pressure is significantly higher and he will try increasing his doxazosin until seen by nephrologist  Hyperlipidemia: Still has mild increase in triglycerides.  LDL is fairly good.  Will need to follow-up fasting levels once blood sugars are stable   Chronic kidney disease: Creatinine stable but PTH is high, will forward results to nephrologist for management  Patient Instructions  4mg  Doxazosin  More sugars after supper  Bolus for all snacks   Counseling time over 50% of today's 25 minute visit  Ileen Kahre 05/08/2014, 10:47 AM   Note: This office note was prepared with Estate agent. Any transcriptional errors that result from this process are unintentional.

## 2014-05-13 ENCOUNTER — Other Ambulatory Visit: Payer: Self-pay | Admitting: Cardiology

## 2014-05-13 ENCOUNTER — Ambulatory Visit (INDEPENDENT_AMBULATORY_CARE_PROVIDER_SITE_OTHER): Payer: Medicare Other | Admitting: *Deleted

## 2014-05-13 DIAGNOSIS — I495 Sick sinus syndrome: Secondary | ICD-10-CM | POA: Diagnosis not present

## 2014-05-13 LAB — MDC_IDC_ENUM_SESS_TYPE_REMOTE
Brady Statistic AP VP Percent: 27 %
Brady Statistic AP VS Percent: 70 %
Brady Statistic AS VP Percent: 2 %
Brady Statistic AS VS Percent: 1 %
Lead Channel Impedance Value: 502 Ohm
Lead Channel Impedance Value: 610 Ohm
Lead Channel Pacing Threshold Amplitude: 1.125 V
Lead Channel Pacing Threshold Amplitude: 2.5 V
Lead Channel Pacing Threshold Pulse Width: 0.4 ms
Lead Channel Pacing Threshold Pulse Width: 0.4 ms
Lead Channel Sensing Intrinsic Amplitude: 22.4 mV
Lead Channel Setting Pacing Amplitude: 2 V
Lead Channel Setting Pacing Amplitude: 2.5 V
Lead Channel Setting Pacing Pulse Width: 0.4 ms
Lead Channel Setting Sensing Sensitivity: 5.6 mV

## 2014-05-13 NOTE — Progress Notes (Signed)
Remote pacemaker transmission.   

## 2014-05-14 DIAGNOSIS — E1140 Type 2 diabetes mellitus with diabetic neuropathy, unspecified: Secondary | ICD-10-CM | POA: Diagnosis not present

## 2014-05-14 DIAGNOSIS — L97529 Non-pressure chronic ulcer of other part of left foot with unspecified severity: Secondary | ICD-10-CM | POA: Diagnosis not present

## 2014-05-28 DIAGNOSIS — L97529 Non-pressure chronic ulcer of other part of left foot with unspecified severity: Secondary | ICD-10-CM | POA: Diagnosis not present

## 2014-05-28 DIAGNOSIS — E1140 Type 2 diabetes mellitus with diabetic neuropathy, unspecified: Secondary | ICD-10-CM | POA: Diagnosis not present

## 2014-05-30 ENCOUNTER — Encounter: Payer: Self-pay | Admitting: Cardiology

## 2014-05-30 DIAGNOSIS — I129 Hypertensive chronic kidney disease with stage 1 through stage 4 chronic kidney disease, or unspecified chronic kidney disease: Secondary | ICD-10-CM | POA: Diagnosis not present

## 2014-05-30 DIAGNOSIS — N183 Chronic kidney disease, stage 3 (moderate): Secondary | ICD-10-CM | POA: Diagnosis not present

## 2014-05-30 DIAGNOSIS — R809 Proteinuria, unspecified: Secondary | ICD-10-CM | POA: Diagnosis not present

## 2014-05-30 DIAGNOSIS — E1129 Type 2 diabetes mellitus with other diabetic kidney complication: Secondary | ICD-10-CM | POA: Diagnosis not present

## 2014-06-02 ENCOUNTER — Other Ambulatory Visit (INDEPENDENT_AMBULATORY_CARE_PROVIDER_SITE_OTHER): Payer: Medicare Other

## 2014-06-02 DIAGNOSIS — IMO0002 Reserved for concepts with insufficient information to code with codable children: Secondary | ICD-10-CM

## 2014-06-02 DIAGNOSIS — E1149 Type 2 diabetes mellitus with other diabetic neurological complication: Secondary | ICD-10-CM | POA: Diagnosis not present

## 2014-06-02 DIAGNOSIS — E1165 Type 2 diabetes mellitus with hyperglycemia: Principal | ICD-10-CM

## 2014-06-02 LAB — COMPREHENSIVE METABOLIC PANEL
ALT: 20 U/L (ref 0–53)
AST: 14 U/L (ref 0–37)
Albumin: 3.8 g/dL (ref 3.5–5.2)
Alkaline Phosphatase: 88 U/L (ref 39–117)
BUN: 51 mg/dL — ABNORMAL HIGH (ref 6–23)
CO2: 29 mEq/L (ref 19–32)
Calcium: 9.4 mg/dL (ref 8.4–10.5)
Chloride: 99 mEq/L (ref 96–112)
Creatinine, Ser: 2.09 mg/dL — ABNORMAL HIGH (ref 0.40–1.50)
GFR: 33.18 mL/min — ABNORMAL LOW (ref >60.00)
Glucose, Bld: 378 mg/dL — ABNORMAL HIGH (ref 70–99)
Potassium: 4.7 mEq/L (ref 3.5–5.1)
Sodium: 134 mEq/L — ABNORMAL LOW (ref 135–145)
Total Bilirubin: 0.5 mg/dL (ref 0.2–1.2)
Total Protein: 6.7 g/dL (ref 6.0–8.3)

## 2014-06-02 LAB — HEMOGLOBIN A1C: Hgb A1c MFr Bld: 9.1 % — ABNORMAL HIGH (ref 4.6–6.5)

## 2014-06-04 ENCOUNTER — Encounter: Payer: Self-pay | Admitting: Internal Medicine

## 2014-06-05 ENCOUNTER — Ambulatory Visit (INDEPENDENT_AMBULATORY_CARE_PROVIDER_SITE_OTHER): Payer: Medicare Other | Admitting: Endocrinology

## 2014-06-05 ENCOUNTER — Encounter: Payer: Self-pay | Admitting: Endocrinology

## 2014-06-05 VITALS — BP 134/66 | HR 84 | Temp 97.4°F | Ht 76.0 in | Wt 267.0 lb

## 2014-06-05 DIAGNOSIS — E1149 Type 2 diabetes mellitus with other diabetic neurological complication: Secondary | ICD-10-CM | POA: Diagnosis not present

## 2014-06-05 DIAGNOSIS — E1165 Type 2 diabetes mellitus with hyperglycemia: Principal | ICD-10-CM

## 2014-06-05 DIAGNOSIS — I10 Essential (primary) hypertension: Secondary | ICD-10-CM | POA: Diagnosis not present

## 2014-06-05 DIAGNOSIS — IMO0002 Reserved for concepts with insufficient information to code with codable children: Secondary | ICD-10-CM

## 2014-06-05 NOTE — Patient Instructions (Signed)
Restart Exercise as tolerated  After dinner readings 3-4x per week    Check at least 3x daily    Call if sugar stays high

## 2014-06-05 NOTE — Progress Notes (Signed)
Patient ID: David [name], male   DOB: Nov 01, 1940, 74 y.o.   MRN: [id number]    Reason for Appointment: Followup for Type 2 Diabetes  Referring physician: Megan Salon  History of Present Illness:          Diagnosis: Type 2 diabetes mellitus, date of diagnosis:   1992       Past history:  He was initially treated with metformin and at some point also glipizide. His previous records from out of town are not available and no information is available about his level of control Apparently he was started on insulin in 1994 approximately because of poor control He has been on various insulin regimens over the last several years However even with insulin he has had poor control for at least the last 7 or 8 years. He does not know what his previous A1c levels have been. He had been continued on metformin and glipizide but metformin stopped about 2 years ago because of kidney function abnormality He had been taking Lantus 60 units twice a day with NovoLog previously and also Before his initial consultation in 6/15 he was on NovoLog twice a day and Humalog mix insulin  Because of poor control and large insulin doses he was started on Victoza in 6/15  Recent history:  Since 04/28/14 he has been on a [employer] insulin pump because of persistent poor control and high insulin requirement Initially with starting the pump his blood sugars had been overall much improved  However his blood sugars have increased significantly more recently and he does not know why. With starting the pump he was doing much better with his diet also; and although he has continued to lose weight.  Has some readings as high as 404 no obvious reason. He thinks he is not having any issues with his infusion sets Current problems and blood sugar patterns:  Checking blood sugars inconsistently although on an average 2.4 times a day  Blood sugars are mostly high except fairly good range between 1/14 and 1/16  Usually no  readings after supper  Not clear if he is taking any boluses for snacks  Sometimes bolusing without checking blood sugar.  Not following up very high blood sugars with a repeat test  No hypoglycemia  When he does a bolus for high readings but does not appear to be bringing his sugar down to normal Still not able to change the basal rates by himself. He is also continuing to take 1.8 mg Victoza        Oral hypoglycemic drugs the patient is taking are: None, has renal dysfunction        INSULIN PUMP  regimen is as follows: Basal rate = midnight = 3.0, 3.3 at 6 AM, 9 AM = 2.9.  3.0 at 3 PM and 7 PM 3.2  Carbohydrate coverage 1:7 for lunch otherwise 1:5, correction 1:15 with target 120-150  Average total daily insulin 98 units  Glucose monitoring:  done about 3 times a day         Glucometer:  contour     Blood Glucose readings   PRE-MEAL Breakfast Lunch Dinner Bedtime Overall  Glucose range:  137-400+   127-400+   126-311     Mean/median:  235   258   244    240+/-95   Hypoglycemia: None recently, able to tolerate lower blood sugars now without symptoms  Glycemic control:   Lab Results  Component Value Date   HGBA1C 9.1* 06/02/2014  HGBA1C 8.5* 03/24/2014   HGBA1C 10.2* 12/13/2013   Lab Results  Component Value Date   MICROALBUR 18.8* 11/20/2013   LDLCALC 49 10/29/2013   CREATININE 2.09* 06/02/2014    Self-care: The diet that the patient has been following is: tries to limit fat intake   Meals: 3 meals per day.breakfast is either cereal or eggs and toast, lunch is usually a sandwich , dinner at  6-7 pm        Exercise:  not doing exercise bike recently, previously  was doing 15-30 min        Dietician visit: Most recent: several years ago.               Compliance with the medical regimen: good Retinal exam: Most recent: 05/2013.    Weight history:  Wt Readings from Last 3 Encounters:  06/05/14 267 lb (121.11 kg)  05/08/14 279 lb 12.8 oz (126.916 kg)  03/19/14  280 lb 3.2 oz (127.098 kg)      Medication List       This list is accurate as of: 06/05/14 11:59 PM.  Always use your most recent med list.               amLODipine 5 MG tablet  Commonly known as:  NORVASC  Take 1 tablet (5 mg total) by mouth daily.     aspirin 81 MG tablet  Take 81 mg by mouth daily.     B-D ULTRAFINE III SHORT PEN 31G X 8 MM Misc  Generic drug:  Insulin Pen Needle     calcitRIOL 0.25 MCG capsule  Commonly known as:  ROCALTROL     carvedilol 25 MG tablet  Commonly known as:  COREG  Take 25 mg by mouth 2 (two) times daily with a meal.     doxazosin 2 MG tablet  Commonly known as:  CARDURA  Take 1 tablet (2 mg total) by mouth daily.     Fish Oil 1000 MG Caps  Take by mouth 2 (two) times daily.     glucose blood test strip  Commonly known as:  BAYER CONTOUR NEXT TEST  Use as instructed to check blood sugar 6 times per day dx code E11.69     insulin aspart 100 UNIT/ML injection  Commonly known as:  NOVOLOG  Use max 120 units per day with insulin pump.     isosorbide mononitrate 30 MG 24 hr tablet  Commonly known as:  IMDUR  TAKE TWO TABLETS BY MOUTH ONCE DAILY     isosorbide mononitrate 30 MG 24 hr tablet  Commonly known as:  IMDUR  TAKE TWO TABLETS BY MOUTH ONCE DAILY     Liraglutide 18 MG/3ML Sopn  Commonly known as:  VICTOZA  Inject 1.8 mg into the skin daily. Inject once daily at the same time     lisinopril 10 MG tablet  Commonly known as:  PRINIVIL,ZESTRIL     Loratadine 10 MG Caps  Take by mouth daily.     mupirocin cream 2 %  Commonly known as:  BACTROBAN     pantoprazole 40 MG tablet  Commonly known as:  PROTONIX  Take 40 mg by mouth daily.     torsemide 20 MG tablet  Commonly known as:  DEMADEX  TAKE TWO TABLETS BY MOUTH ONCE DAILY        Allergies: No Known Allergies  Past Medical History  Diagnosis Date  . Diabetes   . Hypertension   . CAD (coronary  artery disease)   . Renal insufficiency   . Sleep apnea     . Palpitations   . Dyspnea   . Sinus bradycardia   . Obesity   . Allergy   . Kidney failure   . Arthritis   . Diabetes   . Anxiety   . Cancer     skin    Past Surgical History  Procedure Laterality Date  . Insert / replace / remove pacemaker    . Knee surgery    . Vein ligation and stripping    . 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 Cancer Mother     breast  . Heart attack Father     Social History:  reports that he has quit smoking. He does not have any smokeless tobacco history on file. His alcohol and drug histories are not on file.    Review of Systems       Lipids: Had been on a Lipitor since late 2014. Also had taken Gembrozil for several years without apparent side effects.  Both of these were  stopped because of increased liver functions and LFT has been back to normal LDL appears to be still well controlled Currently taking only fish oil Currently lipids are followed by PCP       Lab Results  Component Value Date   CHOL 168 03/24/2014   HDL 20.30* 03/24/2014   LDLCALC 49 10/29/2013   LDLDIRECT 71.7 03/24/2014   TRIG 235.0* 03/24/2014   CHOLHDL 8 03/24/2014       The blood pressure has been treated with various drugs.  Currently taking 2 mg doxazosin, carvedilol, lisinopril and amlodipine with good control.  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 Ankle jerks are absent bilaterally.          Diabetic foot exam:  No callus formation. Mild denudation of skin on left second toe superiorly Amputation of right third toe. Thickening and  dryness of skin on the toes on the right Absent monofilament sensation in the feet Absent pedal pulses     Physical Examination:  BP 134/66 mmHg  Pulse 84  Temp(Src) 97.4 F (36.3 C) (Oral)  Ht 6\' 4"  (1.93 m)  Wt 267 lb (121.11 kg)  BMI 32.51 kg/m2  SpO2 97%  Not indicated    ASSESSMENT:  Diabetes type 2, uncontrolled with neuropathy. His blood sugars are much worse even though it had initially improved with starting the insulin pump As discussed in history of present illness not clear why his blood sugars are worse, has had only mild transient intercurrent illness that may have increase his sugar last week Also A1c has gone over 9% now; however he is not too motivated to improve his sugar control He says he is compliant with his Victoza also Again has limited ability to exercise He does need a little more and reinforcement of education regarding pump management including changing basal rates by himself He was instructed on how to  change his basal rates again today  PLAN:   Pump settings will need to be changed with basal rates = midnight: 3.3, 6 AM = 3.6, 9 AM = 3.1, 3 PM = 3.0 and 7 PM = 3.4  Sensitivity 1:10  Continue same carbohydrate coverage  More consistent postprandial monitoring after supper  Discussed needing to be more consistent with glucose monitoring especially when having intercurrent illnesses  More consistent diet  Follow-up with nurse educator in 2 weeks for reinforcement of day-to-day management and adjustment of insulin again  Cover snacks with boluses  Call if blood sugar not consistently controlled   Hypertension: Blood pressure is better controlled now   Patient Instructions  Restart Exercise as tolerated  After dinner readings 3-4x per week    Check at least 3x daily    Call if sugar stays high     Counseling time over 50% of today's 25 minute visit  [place] Droll 06/06/2014, 2:36 PM   Note: This office note was prepared with  Dragon voice recognition system technology. Any transcriptional errors that result from this process are unintentional.    +

## 2014-06-10 ENCOUNTER — Telehealth: Payer: Self-pay | Admitting: Cardiology

## 2014-06-10 NOTE — Telephone Encounter (Signed)
LMOVM for pt to return call in regards to home monitor upgrade.    

## 2014-06-11 DIAGNOSIS — I129 Hypertensive chronic kidney disease with stage 1 through stage 4 chronic kidney disease, or unspecified chronic kidney disease: Secondary | ICD-10-CM | POA: Diagnosis not present

## 2014-06-17 ENCOUNTER — Encounter: Payer: Medicare Other | Admitting: Nutrition

## 2014-06-18 DIAGNOSIS — L97529 Non-pressure chronic ulcer of other part of left foot with unspecified severity: Secondary | ICD-10-CM | POA: Diagnosis not present

## 2014-06-18 DIAGNOSIS — E1140 Type 2 diabetes mellitus with diabetic neuropathy, unspecified: Secondary | ICD-10-CM | POA: Diagnosis not present

## 2014-06-30 ENCOUNTER — Telehealth: Payer: Self-pay | Admitting: Endocrinology

## 2014-06-30 ENCOUNTER — Other Ambulatory Visit: Payer: Self-pay | Admitting: Family

## 2014-06-30 NOTE — Telephone Encounter (Signed)
novolog needs to be called into the [facility] please

## 2014-07-01 MED ORDER — INSULIN ASPART 100 UNIT/ML ~~LOC~~ SOLN: SUBCUTANEOUS | Status: DC

## 2014-07-01 NOTE — Telephone Encounter (Signed)
Rx sent to pharmacy   

## 2014-07-02 DIAGNOSIS — L97529 Non-pressure chronic ulcer of other part of left foot with unspecified severity: Secondary | ICD-10-CM | POA: Diagnosis not present

## 2014-07-02 DIAGNOSIS — E1140 Type 2 diabetes mellitus with diabetic neuropathy, unspecified: Secondary | ICD-10-CM | POA: Diagnosis not present

## 2014-07-02 DIAGNOSIS — L602 Onychogryphosis: Secondary | ICD-10-CM | POA: Diagnosis not present

## 2014-07-02 DIAGNOSIS — L84 Corns and callosities: Secondary | ICD-10-CM | POA: Diagnosis not present

## 2014-07-09 ENCOUNTER — Ambulatory Visit: Payer: Medicare Other | Admitting: Family

## 2014-07-14 ENCOUNTER — Encounter: Payer: Self-pay | Admitting: Family Medicine

## 2014-07-14 ENCOUNTER — Ambulatory Visit (INDEPENDENT_AMBULATORY_CARE_PROVIDER_SITE_OTHER): Payer: Medicare Other | Admitting: Family Medicine

## 2014-07-14 VITALS — BP 118/74 | HR 97 | Temp 98.1°F | Wt 272.5 lb

## 2014-07-14 DIAGNOSIS — J0100 Acute maxillary sinusitis, unspecified: Secondary | ICD-10-CM | POA: Diagnosis not present

## 2014-07-14 DIAGNOSIS — H6122 Impacted cerumen, left ear: Secondary | ICD-10-CM

## 2014-07-14 NOTE — Progress Notes (Signed)
Pre visit review using our clinic review tool, if applicable. No additional management support is needed unless otherwise documented below in the visit note. 

## 2014-07-14 NOTE — Progress Notes (Signed)
   Subjective:    Patient ID: David [name], male    DOB: 1941-02-11, 74 y.o.   MRN: [id number]  HPI Here for one week of coughing which has been dry, then he developed sinus pressure and blowing green mucus from the nose. Then this am he was dizzy for an hour or so, feeling like the room was spinning.    Review of Systems  Constitutional: Negative.   HENT: Positive for congestion, hearing loss, postnasal drip and sinus pressure. Negative for ear pain.   Eyes: Negative.   Respiratory: Positive for cough.        Objective:   Physical Exam  Constitutional: He is oriented to person, place, and time. He appears well-developed and well-nourished.  HENT:  Right Ear: External ear normal.  Nose: Nose normal.  Mouth/Throat: Oropharynx is clear and moist.  Left ear is full of cerumen   Eyes: Conjunctivae are normal.  Pulmonary/Chest: Effort normal and breath sounds normal.  Lymphadenopathy:    He has no cervical adenopathy.  Neurological: He is alert and oriented to person, place, and time. No cranial nerve deficit.          Assessment & Plan:  The ear was irrigated clear with water. Given Augmentin and he will add Mucinex

## 2014-07-15 ENCOUNTER — Telehealth: Payer: Self-pay | Admitting: Family

## 2014-07-15 MED ORDER — AMOXICILLIN-POT CLAVULANATE 875-125 MG PO TABS: 1.0000 | ORAL_TABLET | Freq: Two times a day (BID) | ORAL | Status: DC

## 2014-07-15 NOTE — Telephone Encounter (Signed)
Pt called to say that Dr Sarajane Jews was suppose to called him in a RX for AUGMENTIN.  Pt contacted [facility] and they did not have the RX.

## 2014-07-15 NOTE — Telephone Encounter (Signed)
Per Dr Sarajane Jews send in Augmentin 875mg  bid x10 days, rx sent in electronically to [facility], pt aware

## 2014-07-16 ENCOUNTER — Ambulatory Visit: Payer: Medicare Other | Admitting: Endocrinology

## 2014-07-18 ENCOUNTER — Encounter: Payer: Self-pay | Admitting: Family

## 2014-07-18 ENCOUNTER — Ambulatory Visit (INDEPENDENT_AMBULATORY_CARE_PROVIDER_SITE_OTHER): Payer: Medicare Other | Admitting: Family

## 2014-07-18 VITALS — BP 116/58 | HR 60 | Temp 98.3°F | Resp 16 | Ht 76.0 in | Wt 275.0 lb

## 2014-07-18 DIAGNOSIS — E118 Type 2 diabetes mellitus with unspecified complications: Secondary | ICD-10-CM

## 2014-07-18 DIAGNOSIS — S91109S Unspecified open wound of unspecified toe(s) without damage to nail, sequela: Secondary | ICD-10-CM

## 2014-07-18 DIAGNOSIS — S91109A Unspecified open wound of unspecified toe(s) without damage to nail, initial encounter: Secondary | ICD-10-CM | POA: Insufficient documentation

## 2014-07-18 DIAGNOSIS — E1165 Type 2 diabetes mellitus with hyperglycemia: Secondary | ICD-10-CM

## 2014-07-18 DIAGNOSIS — E1149 Type 2 diabetes mellitus with other diabetic neurological complication: Secondary | ICD-10-CM

## 2014-07-18 DIAGNOSIS — IMO0002 Reserved for concepts with insufficient information to code with codable children: Secondary | ICD-10-CM

## 2014-07-18 NOTE — Progress Notes (Signed)
Subjective:    Patient ID: David [name], male    DOB: [birth year], 74 y.o.   MRN: [id number]  Chief Complaint  Patient presents with  . Establish Care    have some health concerns    HPI:  David [name] is a 74 y.o. male who presents today to establish care and discuss his toe.  1) Toe - Associated symptom of an ulcer on his right great toe has been going on for about 4 months and is currently being treated with mupricin which appears to be helping. This is managed by Dr. Melony Overly of [facility].  2) Diabetes - currently maintained Vicotza and insulin pump.   Lab Results  Component Value Date   HGBA1C 9.1* 06/02/2014    No Known Allergies   Current Outpatient Prescriptions on File Prior to Visit  Medication Sig Dispense Refill  . amLODipine (NORVASC) 5 MG tablet Take 1 tablet (5 mg total) by mouth daily. 180 tablet 2  . amoxicillin-clavulanate (AUGMENTIN) 875-125 MG per tablet Take 1 tablet by mouth 2 (two) times daily. 20 tablet 0  . aspirin 81 MG tablet Take 81 mg by mouth daily.    . B-D ULTRAFINE III SHORT PEN 31G X 8 MM MISC     . calcitRIOL (ROCALTROL) 0.25 MCG capsule     . carvedilol (COREG) 25 MG tablet Take 25 mg by mouth 2 (two) times daily with a meal.    . doxazosin (CARDURA) 2 MG tablet TAKE ONE TABLET BY MOUTH ONCE DAILY. 90 tablet 0  . glucose blood (BAYER CONTOUR NEXT TEST) test strip Use as instructed to check blood sugar 6 times per day dx code E11.69 200 each 3  . insulin aspart (NOVOLOG) 100 UNIT/ML injection Use max 120 units per day with insulin pump. 40 mL 1  . isosorbide mononitrate (IMDUR) 30 MG 24 hr tablet TAKE TWO TABLETS BY MOUTH ONCE DAILY 60 tablet 0  . Liraglutide (VICTOZA) 18 MG/3ML SOPN Inject 1.8 mg into the skin daily. Inject once daily at the same time 3 pen 3  . lisinopril (PRINIVIL,ZESTRIL) 10 MG tablet     . mupirocin cream (BACTROBAN) 2 %     . Omega-3 Fatty Acids (FISH OIL) 1000 MG CAPS Take by mouth 2 (two) times daily.    .  pantoprazole (PROTONIX) 40 MG tablet Take 40 mg by mouth daily.     Marland Kitchen torsemide (DEMADEX) 20 MG tablet TAKE TWO TABLETS BY MOUTH ONCE DAILY 60 tablet 6   No current facility-administered medications on file prior to visit.    Review of Systems  Eyes:       Negative for changes in vision.  Respiratory: Negative for chest tightness and shortness of breath.   Cardiovascular: Negative for chest pain, palpitations and leg swelling.  Endocrine: Negative for polydipsia, polyphagia and polyuria.      Objective:    BP 116/58 mmHg  Pulse 60  Temp(Src) 98.3 F (36.8 C) (Oral)  Resp 16  Ht 6\' 4"  (1.93 m)  Wt 275 lb (124.739 kg)  BMI 33.49 kg/m2  SpO2 94% Nursing note and vital signs reviewed.  Physical Exam  Constitutional: He is oriented to person, place, and time. He appears well-developed and well-nourished. No distress.  Cardiovascular: Normal rate, regular rhythm, normal heart sounds and intact distal pulses.   Pulmonary/Chest: Effort normal and breath sounds normal.  Neurological: He is alert and oriented to person, place, and time.  Skin: Skin is warm and dry.  Psychiatric:  He has a normal mood and affect. His behavior is normal. Judgment and thought content normal.       Assessment & Plan:

## 2014-07-18 NOTE — Assessment & Plan Note (Signed)
Recently started on insulin pump by Dr. Dwyane Dee. Indicates daily sugars are improved. Due for diabetic eye exam. Refer to opthalmology. Foot exam completed by Dr. Melony Overly. Follow up as needed.

## 2014-07-18 NOTE — Assessment & Plan Note (Signed)
Wound appears to be healing well. Continue management per Dr. Melony Overly of podiatry.

## 2014-07-18 NOTE — Patient Instructions (Signed)
Thank you for choosing [facility].  Summary/Instructions:   Referrals have been made during this visit. You should expect to hear back from our schedulers in about 7-10 days in regards to establishing an appointment with the specialists we discussed.

## 2014-07-18 NOTE — Progress Notes (Signed)
Pre visit review using our clinic review tool, if applicable. No additional management support is needed unless otherwise documented below in the visit note. 

## 2014-07-22 DIAGNOSIS — L97529 Non-pressure chronic ulcer of other part of left foot with unspecified severity: Secondary | ICD-10-CM | POA: Diagnosis not present

## 2014-08-04 ENCOUNTER — Encounter: Payer: Self-pay | Admitting: Family

## 2014-08-04 ENCOUNTER — Other Ambulatory Visit (INDEPENDENT_AMBULATORY_CARE_PROVIDER_SITE_OTHER): Payer: Medicare Other

## 2014-08-04 ENCOUNTER — Ambulatory Visit (INDEPENDENT_AMBULATORY_CARE_PROVIDER_SITE_OTHER): Payer: Medicare Other | Admitting: Family

## 2014-08-04 VITALS — BP 130/50 | HR 59 | Temp 98.2°F | Resp 18 | Ht 76.0 in | Wt 276.0 lb

## 2014-08-04 DIAGNOSIS — R42 Dizziness and giddiness: Secondary | ICD-10-CM

## 2014-08-04 MED ORDER — PANTOPRAZOLE SODIUM 40 MG PO TBEC: 40.0000 mg | DELAYED_RELEASE_TABLET | Freq: Every day | ORAL | Status: DC

## 2014-08-04 MED ORDER — CIPROFLOXACIN-DEXAMETHASONE 0.3-0.1 % OT SUSP: 4.0000 [drp] | Freq: Two times a day (BID) | OTIC | Status: DC

## 2014-08-04 MED ORDER — LISINOPRIL 10 MG PO TABS: 10.0000 mg | ORAL_TABLET | Freq: Every day | ORAL | Status: DC

## 2014-08-04 MED ORDER — CARVEDILOL 25 MG PO TABS: 25.0000 mg | ORAL_TABLET | Freq: Two times a day (BID) | ORAL | Status: DC

## 2014-08-04 NOTE — Progress Notes (Signed)
Subjective:    Patient ID: David [name], male    DOB: [birth year], 74 y.o.   MRN: [id number]  Chief Complaint  Patient presents with  . Light headed    was walking around [place] last week and he got light headed and vision changes like when you get your eyes dialated, he feels that way when his sugar is low but its not low, left ear pain    HPI:  David [name] is a 74 y.o. male who presents today for an acute appointment.  This is a new problem. Associated symptom of lightheadedness which has been going on for about  2 weeks. Describes the sensation like his blood sugars are low but they are not. Notes that he has some left sided ear pain. Indicates that these symptoms are waxing and waning and not currently experiencing any symptoms.  Mainly occurs when he is walking and denies any vertigo. Denies any events of hypoglycemia.    No Known Allergies   Current Outpatient Prescriptions on File Prior to Visit  Medication Sig Dispense Refill  . amLODipine (NORVASC) 5 MG tablet Take 1 tablet (5 mg total) by mouth daily. 180 tablet 2  . amoxicillin-clavulanate (AUGMENTIN) 875-125 MG per tablet Take 1 tablet by mouth 2 (two) times daily. 20 tablet 0  . aspirin 81 MG tablet Take 81 mg by mouth daily.    . B-D ULTRAFINE III SHORT PEN 31G X 8 MM MISC     . calcitRIOL (ROCALTROL) 0.25 MCG capsule     . doxazosin (CARDURA) 2 MG tablet TAKE ONE TABLET BY MOUTH ONCE DAILY. 90 tablet 0  . glucose blood (BAYER CONTOUR NEXT TEST) test strip Use as instructed to check blood sugar 6 times per day dx code E11.69 200 each 3  . insulin aspart (NOVOLOG) 100 UNIT/ML injection Use max 120 units per day with insulin pump. 40 mL 1  . isosorbide mononitrate (IMDUR) 30 MG 24 hr tablet TAKE TWO TABLETS BY MOUTH ONCE DAILY 60 tablet 0  . Liraglutide (VICTOZA) 18 MG/3ML SOPN Inject 1.8 mg into the skin daily. Inject once daily at the same time 3 pen 3  . mupirocin cream (BACTROBAN) 2 %     . Omega-3 Fatty Acids  (FISH OIL) 1000 MG CAPS Take by mouth 2 (two) times daily.    Marland Kitchen torsemide (DEMADEX) 20 MG tablet TAKE TWO TABLETS BY MOUTH ONCE DAILY 60 tablet 6   No current facility-administered medications on file prior to visit.    Review of Systems  Constitutional: Negative for fever and chills.  HENT: Negative for congestion.   Cardiovascular: Negative for chest pain, palpitations and leg swelling.  Neurological: Positive for light-headedness.      Objective:    BP 130/50 mmHg  Pulse 59  Temp(Src) 98.2 F (36.8 C) (Oral)  Resp 18  Ht 6\' 4"  (1.93 m)  Wt 276 lb (125.193 kg)  BMI 33.61 kg/m2  SpO2 97% Nursing note and vital signs reviewed.  Physical Exam  Constitutional: He is oriented to person, place, and time. He appears well-developed and well-nourished. No distress.  HENT:  Right Ear: Hearing, tympanic membrane, external ear and ear canal normal.  Left Ear: Hearing and tympanic membrane normal.  Nose: Right sinus exhibits no maxillary sinus tenderness and no frontal sinus tenderness. Left sinus exhibits no maxillary sinus tenderness and no frontal sinus tenderness.  Mouth/Throat: Uvula is midline, oropharynx is clear and moist and mucous membranes are normal.  Left external ear slightly  tender to palpation.  Cardiovascular: Normal rate, regular rhythm, normal heart sounds and intact distal pulses.   Pulmonary/Chest: Effort normal and breath sounds normal.  Neurological: He is alert and oriented to person, place, and time.  Skin: Skin is warm and dry.  Psychiatric: He has a normal mood and affect. His behavior is normal. Judgment and thought content normal.       Assessment & Plan:

## 2014-08-04 NOTE — Progress Notes (Signed)
Pre visit review using our clinic review tool, if applicable. No additional management support is needed unless otherwise documented below in the visit note. 

## 2014-08-04 NOTE — Patient Instructions (Signed)
Thank you for choosing [facility].  Summary/Instructions:  Your prescription(s) have been submitted to your pharmacy or been printed and provided for you. Please take as directed and contact our office if you believe you are having problem(s) with the medication(s) or have any questions.  If your symptoms worsen or fail to improve, please contact our office for further instruction, or in case of emergency go directly to the emergency room at the closest medical facility.   Dizziness Dizziness is a common problem. It is a feeling of unsteadiness or light-headedness. You may feel like you are about to faint. Dizziness can lead to injury if you stumble or fall. A person of any age group can suffer from dizziness, but dizziness is more common in older adults. CAUSES  Dizziness can be caused by many different things, including:  Middle ear problems.  Standing for too long.  Infections.  An allergic reaction.  Aging.  An emotional response to something, such as the sight of blood.  Side effects of medicines.  Tiredness.  Problems with circulation or blood pressure.  Excessive use of alcohol or medicines, or illegal drug use.  Breathing too fast (hyperventilation).  An irregular heart rhythm (arrhythmia).  A low red blood cell count (anemia).  Pregnancy.  Vomiting, diarrhea, fever, or other illnesses that cause body fluid loss (dehydration).  Diseases or conditions such as Parkinson's disease, high blood pressure (hypertension), diabetes, and thyroid problems.  Exposure to extreme heat. DIAGNOSIS  Your health care provider will ask about your symptoms, perform a physical exam, and perform an electrocardiogram (ECG) to record the electrical activity of your heart. Your health care provider may also perform other heart or blood tests to determine the cause of your dizziness. These may include:  Transthoracic echocardiogram (TTE). During echocardiography, sound waves  are used to evaluate how blood flows through your heart.  Transesophageal echocardiogram (TEE).  Cardiac monitoring. This allows your health care provider to monitor your heart rate and rhythm in real time.  Holter monitor. This is a portable device that records your heartbeat and can help diagnose heart arrhythmias. It allows your health care provider to track your heart activity for several days if needed.  Stress tests by exercise or by giving medicine that makes the heart beat faster. TREATMENT  Treatment of dizziness depends on the cause of your symptoms and can vary greatly. HOME CARE INSTRUCTIONS   Drink enough fluids to keep your urine clear or pale yellow. This is especially important in very hot weather. In older adults, it is also important in cold weather.  Take your medicine exactly as directed if your dizziness is caused by medicines. When taking blood pressure medicines, it is especially important to get up slowly.  Rise slowly from chairs and steady yourself until you feel okay.  In the morning, first sit up on the side of the bed. When you feel okay, stand slowly while holding onto something until you know your balance is fine.  Move your legs often if you need to stand in one place for a long time. Tighten and relax your muscles in your legs while standing.  Have someone stay with you for 1-2 days if dizziness continues to be a problem. Do this until you feel you are well enough to stay alone. Have the person call your health care provider if he or she notices changes in you that are concerning.  Do not drive or use heavy machinery if you feel dizzy.  Do not  drink alcohol. SEEK IMMEDIATE MEDICAL CARE IF:   Your dizziness or light-headedness gets worse.  You feel nauseous or vomit.  You have problems talking, walking, or using your arms, hands, or legs.  You feel weak.  You are not thinking clearly or you have trouble forming sentences. It may take a friend or  family member to notice this.  You have chest pain, abdominal pain, shortness of breath, or sweating.  Your vision changes.  You notice any bleeding.  You have side effects from medicine that seems to be getting worse rather than better. MAKE SURE YOU:   Understand these instructions.  Will watch your condition.  Will get help right away if you are not doing well or get worse. Document Released: 10/26/2000 Document Revised: 05/07/2013 Document Reviewed: 11/19/2010 [facility] Patient Information 2015 [place], [place]. This information is not intended to replace advice given to you by your health care provider. Make sure you discuss any questions you have with your health care provider.

## 2014-08-04 NOTE — Assessment & Plan Note (Addendum)
Patient experiencing episodic lightheadedness. Cannot rule out congestion as he has just completed antibiotics for sinus infection. Some left mild ear redness and tenderness noted. Start Ciprodex. Continue over-the-counter medications as needed for congestion. Obtain basic metabolic panel to rule out electrolyte disturbance. Patient indicates his blood sugars are well controlled. Cannot rule out medications as potential source, however no new medications have been added since symptoms started. Follow-up pending basic metabolic panel results and if symptoms fail to resolve or worsen.

## 2014-08-05 ENCOUNTER — Ambulatory Visit (INDEPENDENT_AMBULATORY_CARE_PROVIDER_SITE_OTHER): Payer: Medicare Other | Admitting: Endocrinology

## 2014-08-05 ENCOUNTER — Encounter: Payer: Self-pay | Admitting: Endocrinology

## 2014-08-05 ENCOUNTER — Telehealth: Payer: Self-pay

## 2014-08-05 ENCOUNTER — Encounter: Payer: Self-pay | Admitting: Family

## 2014-08-05 VITALS — BP 130/58 | HR 87 | Temp 98.0°F | Resp 14 | Ht 76.0 in | Wt 271.8 lb

## 2014-08-05 DIAGNOSIS — E1165 Type 2 diabetes mellitus with hyperglycemia: Principal | ICD-10-CM

## 2014-08-05 DIAGNOSIS — E1149 Type 2 diabetes mellitus with other diabetic neurological complication: Secondary | ICD-10-CM | POA: Diagnosis not present

## 2014-08-05 DIAGNOSIS — I10 Essential (primary) hypertension: Secondary | ICD-10-CM

## 2014-08-05 DIAGNOSIS — IMO0002 Reserved for concepts with insufficient information to code with codable children: Secondary | ICD-10-CM

## 2014-08-05 LAB — BASIC METABOLIC PANEL
BUN: 44 mg/dL — ABNORMAL HIGH (ref 6–23)
CO2: 24 mEq/L (ref 19–32)
Calcium: 9.6 mg/dL (ref 8.4–10.5)
Chloride: 106 mEq/L (ref 96–112)
Creatinine, Ser: 1.95 mg/dL — ABNORMAL HIGH (ref 0.40–1.50)
GFR: 35.93 mL/min — ABNORMAL LOW (ref >60.00)
Glucose, Bld: 200 mg/dL — ABNORMAL HIGH (ref 70–99)
Potassium: 5.1 mEq/L (ref 3.5–5.1)
Sodium: 138 mEq/L (ref 135–145)

## 2014-08-05 MED ORDER — NEOMYCIN-POLYMYXIN-HC 3.5-10000-1 OT SOLN: 3.0000 [drp] | Freq: Four times a day (QID) | OTIC | Status: DC

## 2014-08-05 NOTE — Patient Instructions (Signed)
#  1.  Blood sugar checking: Check blood sugars with every meal as far as possible.  Also check blood sugars 2 hours AFTER eating especially after supper.  Try to check the blood sugar at least 2-3 times a day on an average  #2.  Take boluses as indicated by the pump with every meal and also for high sugar levels even if not eating.  #3.  Cover all meals and all carbohydrate intake including at bedtime with boluses  #4.  Start exercise with exercise bike daily and increase duration as tolerated  5.  Periodically try checking the blood sugar during the night if able to  #6.  If the blood sugar is over 250 make sure to check the blood sugar again a couple of hours later after bolusing to make sure it is better.  #7.  Call if starting to get low blood sugars

## 2014-08-05 NOTE — Progress Notes (Signed)
Patient ID: David [name], male   DOB: 06-Feb-1941, 74 y.o.   MRN: [id number]    Reason for Appointment: Followup for Type 2 Diabetes  Referring physician: Megan Salon  History of Present Illness:          Diagnosis: Type 2 diabetes mellitus, date of diagnosis:   1992       Past history:  He was initially treated with metformin and at some point also glipizide. His previous records from out of town are not available and no information is available about his level of control Apparently he was started on insulin in 1994 approximately because of poor control He has been on various insulin regimens over the last several years However even with insulin he has had poor control for at least the last 7 or 8 years. He does not know what his previous A1c levels have been. He had been continued on metformin and glipizide but metformin stopped about 2 years ago because of kidney function abnormality He had been taking Lantus 60 units twice a day with NovoLog previously and also Before his initial consultation in 6/15 he was on NovoLog twice a day and Humalog mix insulin  Because of poor control and large insulin doses he was started on Victoza in 6/15  Recent history:  Since 04/28/14 he has been on a Medtronic insulin pump because of persistent poor control and high insulin requirement Initially with starting the pump his blood sugars had been overall much improved  However his blood sugars have been poorly controlled subsequently and this is mostly related to his noncompliance with day-to-day management Current problems and blood sugar patterns:  Checking blood sugars infrequently and mostly in the mornings; frequently will check the blood sugar only once a day  His fasting blood sugars are consistently high despite increasing his basal rate overnight on the last visit  He is usually not checking his blood sugars at lunch and supper when he is bolusing  Usually no readings after  supper  Not taking any boluses for snacks especially at night when he may eat a peanut butter sandwich  Not clear what his postprandial blood sugars are as he usually does not do these  Occasionally will override the pump and not take enough insulin  Not exercising  He is also continuing to take 1.8 mg Victoza        Oral hypoglycemic drugs the patient is taking are: None, has renal dysfunction        INSULIN PUMP  regimen is as follows: Basal rates: midnight = 3.3, 3.6 at 6 AM, 9 AM = 3.1.  3.0 at 3 PM and 7 PM 3.4 Carbohydrate coverage 1:7 for lunch otherwise 1:5, correction 1:10 with target 120-150  Average total daily insulin 95 units, basal 78 units  Glucose monitoring:  done about 2.2 times a day         Glucometer:  contour     Blood Glucose readings   PRE-MEAL Breakfast Lunch Dinner Bedtime Overall  Glucose range:  147-317   179, 271   93-296  ?     Mean/median:      247+/-100    POST-MEAL PC Breakfast PC Lunch PC Dinner  Glucose range:     Mean/median:        PRE-MEAL Breakfast Lunch Dinner Bedtime Overall  Glucose range:  137-400+   127-400+   126-311     Mean/median:  235   258   244  240+/-95   Hypoglycemia: None recently, able to tolerate lower blood sugars now without symptoms  Glycemic control:   Lab Results  Component Value Date   HGBA1C 9.1* 06/02/2014   HGBA1C 8.5* 03/24/2014   HGBA1C 10.2* 12/13/2013   Lab Results  Component Value Date   MICROALBUR 18.8* 11/20/2013   LDLCALC 49 10/29/2013   CREATININE 2.09* 06/02/2014    Self-care: The diet that the patient has been following is: tries to limit fat intake   Meals: 3 meals per day.breakfast is either cereal or eggs and toast, lunch is usually a sandwich , dinner at  6-7 pm        Exercise:  not doing exercise bike, previously  was doing 15-30 min        Dietician visit: Most recent: several years ago.               Compliance with the medical regimen: good Retinal exam: Most recent:  05/2013.    Weight history:  Wt Readings from Last 3 Encounters:  08/05/14 271 lb 12.8 oz (123.288 kg)  08/04/14 276 lb (125.193 kg)  07/18/14 275 lb (124.739 kg)      Medication List       This list is accurate as of: 08/05/14 10:03 AM.  Always use your most recent med list.               amLODipine 5 MG tablet  Commonly known as:  NORVASC  Take 1 tablet (5 mg total) by mouth daily.     amoxicillin-clavulanate 875-125 MG per tablet  Commonly known as:  AUGMENTIN  Take 1 tablet by mouth 2 (two) times daily.     aspirin 81 MG tablet  Take 81 mg by mouth daily.     B-D ULTRAFINE III SHORT PEN 31G X 8 MM Misc  Generic drug:  Insulin Pen Needle     calcitRIOL 0.25 MCG capsule  Commonly known as:  ROCALTROL     carvedilol 25 MG tablet  Commonly known as:  COREG  Take 1 tablet (25 mg total) by mouth 2 (two) times daily with a meal.     ciprofloxacin-dexamethasone otic suspension  Commonly known as:  CIPRODEX  Place 4 drops into the left ear 2 (two) times daily.     doxazosin 2 MG tablet  Commonly known as:  CARDURA  TAKE ONE TABLET BY MOUTH ONCE DAILY.     Fish Oil 1000 MG Caps  Take by mouth 2 (two) times daily.     glucose blood test strip  Commonly known as:  BAYER CONTOUR NEXT TEST  Use as instructed to check blood sugar 6 times per day dx code E11.69     insulin aspart 100 UNIT/ML injection  Commonly known as:  NOVOLOG  Use max 120 units per day with insulin pump.     isosorbide mononitrate 30 MG 24 hr tablet  Commonly known as:  IMDUR  TAKE TWO TABLETS BY MOUTH ONCE DAILY     Liraglutide 18 MG/3ML Sopn  Commonly known as:  VICTOZA  Inject 1.8 mg into the skin daily. Inject once daily at the same time     lisinopril 10 MG tablet  Commonly known as:  PRINIVIL,ZESTRIL  Take 1 tablet (10 mg total) by mouth daily.     mupirocin cream 2 %  Commonly known as:  BACTROBAN     pantoprazole 40 MG tablet  Commonly known as:  PROTONIX  Take 1 tablet (40  mg total) by mouth daily.     torsemide 20 MG tablet  Commonly known as:  DEMADEX  TAKE TWO TABLETS BY MOUTH ONCE DAILY        Allergies: No Known Allergies  Past Medical History  Diagnosis Date  . Diabetes   . Hypertension   . CAD (coronary artery disease)   . Renal insufficiency   . Sleep apnea   . Palpitations   . Dyspnea   . Sinus bradycardia   . Obesity   . Allergy   . Kidney failure   . Arthritis   . Diabetes   . Anxiety   . Cancer     skin    Past Surgical History  Procedure Laterality Date  . Insert / replace / remove pacemaker    . Knee surgery    . Vein ligation and stripping    . 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 Cancer Mother     breast  . Heart attack Father     Social History:  reports that he has quit smoking. He has never used smokeless tobacco. He reports that he drinks about 1.2 oz of alcohol per week. He reports that he does not use illicit drugs.    Review of Systems       Lipids: Had been on a Lipitor since late 2014. Also had taken Gembrozil for several years without apparent side effects.  Both of these were  stopped because of increased liver functions and LFT has been back to normal  LDL has not been high subsequently Currently taking only fish oil Currently lipids are followed by PCP       Lab Results  Component Value Date   CHOL 168 03/24/2014   HDL 20.30* 03/24/2014   LDLCALC 49 10/29/2013   LDLDIRECT 71.7 03/24/2014   TRIG 235.0* 03/24/2014   CHOLHDL 8 03/24/2014       The blood pressure has been treated with various drugs.  Currently taking 2 mg doxazosin, carvedilol, lisinopril and amlodipine with good control.  Chronic kidney disease: Etiology unknown, is followed by nephrologist  Last creatinine:  Lab Results  Component Value Date   CREATININE 2.09* 06/02/2014        Has history of Numbness in his feet  since about 2013, no tingling or  burning in feet       He has been seeing the podiatrist for a toe ulcer  Previous foot exam findings:   Vibration sense is absent in toes. Ankle jerks are absent bilaterally.          Diabetic foot exam:  No callus formation. Mild denudation of skin on left second toe superiorly Amputation of right third toe. Thickening and dryness of skin on the toes on the right Absent monofilament sensation in the feet Absent pedal pulses     Physical Examination:  BP 130/58 mmHg  Pulse 87  Temp(Src) 98 F (36.7 C)  Resp 14  Ht 6\' 4"  (1.93 m)  Wt 271 lb 12.8 oz (123.288 kg)  BMI 33.10 kg/m2  SpO2 98%  Not indicated    ASSESSMENT:  Diabetes type 2, uncontrolled with neuropathy. His blood sugars are overall poorly controlled even with the insulin pump As discussed in history of present illness he has difficulty complying with various aspects of day-to-day care He can do better with glucose monitoring, diet, exercise, bolusing consistently  PLAN:   Pump settings will need to be changed with basal rates = midnight: 3.5 6 AM = 3.8, 9 AM = 3.1, 3 PM = 3.3 and 7 PM = 3.4  Check blood sugars at least 2-3 times a day at various times as directed and also follow-up high readings with another blood sugar check  More consistent boluses for all meals and snacks  Start regular exercise  Reviewed instructions in detail the patient  Hypertension: Blood pressure is controlled   Patient Instructions  #1.  Blood sugar checking: Check blood sugars with every meal as far as possible.  Also check blood sugars 2 hours AFTER eating especially after supper.  Try to check the blood sugar at least 2-3 times a day on an average  #2.  Take boluses as indicated by the pump with every meal and also for high sugar levels even if not eating.  #3.  Cover all meals and all carbohydrate intake including at bedtime with boluses  #4.  Start exercise with exercise bike daily and increase duration as tolerated  5.   Periodically try checking the blood sugar during the night if able to  #6.  If the blood sugar is over 250 make sure to check the blood sugar again a couple of hours later after bolusing to make sure it is better.  #7.  Call if starting to get low blood sugars    Counseling time over 50% of today's 25 minute visit  Abisola Carrero 08/05/2014, 10:03 AM   Note: This office note was prepared with Estate agent. Any transcriptional errors that result from this process are unintentional.

## 2014-08-05 NOTE — Telephone Encounter (Signed)
Cortisporin is covered they just need an rx sent in.

## 2014-08-05 NOTE — Telephone Encounter (Signed)
Medication sent.

## 2014-08-07 DIAGNOSIS — E1140 Type 2 diabetes mellitus with diabetic neuropathy, unspecified: Secondary | ICD-10-CM | POA: Diagnosis not present

## 2014-08-07 DIAGNOSIS — L97529 Non-pressure chronic ulcer of other part of left foot with unspecified severity: Secondary | ICD-10-CM | POA: Diagnosis not present

## 2014-08-08 ENCOUNTER — Ambulatory Visit: Payer: Medicare Other | Admitting: Endocrinology

## 2014-08-12 ENCOUNTER — Telehealth: Payer: Self-pay | Admitting: Cardiology

## 2014-08-12 ENCOUNTER — Encounter: Payer: Medicare Other | Admitting: *Deleted

## 2014-08-12 NOTE — Telephone Encounter (Signed)
Spoke with pt and reminded pt of remote transmission that is due today. Pt verbalized understanding.   

## 2014-08-13 ENCOUNTER — Encounter: Payer: Self-pay | Admitting: Cardiology

## 2014-08-14 ENCOUNTER — Ambulatory Visit (INDEPENDENT_AMBULATORY_CARE_PROVIDER_SITE_OTHER): Payer: Medicare Other | Admitting: *Deleted

## 2014-08-14 DIAGNOSIS — I495 Sick sinus syndrome: Secondary | ICD-10-CM | POA: Diagnosis not present

## 2014-08-14 NOTE — Progress Notes (Signed)
Remote pacemaker transmission.   

## 2014-08-15 LAB — MDC_IDC_ENUM_SESS_TYPE_REMOTE
Battery Impedance: 3534 Ohm
Battery Remaining Longevity: 12 mo
Battery Voltage: 2.69 V
Brady Statistic AP VP Percent: 31 %
Brady Statistic AP VS Percent: 66 %
Brady Statistic AS VP Percent: 2 %
Brady Statistic AS VS Percent: 1 %
Date Time Interrogation Session: 20160331145009
Lead Channel Impedance Value: 546 Ohm
Lead Channel Impedance Value: 596 Ohm
Lead Channel Pacing Threshold Amplitude: 1 V
Lead Channel Pacing Threshold Amplitude: 2.5 V
Lead Channel Pacing Threshold Pulse Width: 0.4 ms
Lead Channel Pacing Threshold Pulse Width: 0.4 ms
Lead Channel Sensing Intrinsic Amplitude: 16 mV
Lead Channel Setting Pacing Amplitude: 2 V
Lead Channel Setting Pacing Amplitude: 2.5 V
Lead Channel Setting Pacing Pulse Width: 0.4 ms
Lead Channel Setting Sensing Sensitivity: 5.6 mV

## 2014-08-20 ENCOUNTER — Encounter: Payer: Self-pay | Admitting: Cardiology

## 2014-08-21 DIAGNOSIS — N2581 Secondary hyperparathyroidism of renal origin: Secondary | ICD-10-CM | POA: Diagnosis not present

## 2014-08-21 DIAGNOSIS — R809 Proteinuria, unspecified: Secondary | ICD-10-CM | POA: Diagnosis not present

## 2014-08-21 DIAGNOSIS — N183 Chronic kidney disease, stage 3 (moderate): Secondary | ICD-10-CM | POA: Diagnosis not present

## 2014-08-21 LAB — HM DIABETES EYE EXAM

## 2014-08-22 DIAGNOSIS — I129 Hypertensive chronic kidney disease with stage 1 through stage 4 chronic kidney disease, or unspecified chronic kidney disease: Secondary | ICD-10-CM | POA: Diagnosis not present

## 2014-08-22 DIAGNOSIS — E1129 Type 2 diabetes mellitus with other diabetic kidney complication: Secondary | ICD-10-CM | POA: Diagnosis not present

## 2014-08-22 DIAGNOSIS — R809 Proteinuria, unspecified: Secondary | ICD-10-CM | POA: Diagnosis not present

## 2014-08-22 DIAGNOSIS — N183 Chronic kidney disease, stage 3 (moderate): Secondary | ICD-10-CM | POA: Diagnosis not present

## 2014-08-26 ENCOUNTER — Encounter: Payer: Medicare Other | Admitting: Nutrition

## 2014-08-28 ENCOUNTER — Other Ambulatory Visit: Payer: Self-pay | Admitting: *Deleted

## 2014-08-28 ENCOUNTER — Encounter: Payer: Self-pay | Admitting: Internal Medicine

## 2014-08-28 MED ORDER — GLUCOSE BLOOD VI STRP: ORAL_STRIP | Status: DC

## 2014-09-03 DIAGNOSIS — E1151 Type 2 diabetes mellitus with diabetic peripheral angiopathy without gangrene: Secondary | ICD-10-CM | POA: Diagnosis not present

## 2014-09-03 DIAGNOSIS — L84 Corns and callosities: Secondary | ICD-10-CM | POA: Diagnosis not present

## 2014-09-03 DIAGNOSIS — L602 Onychogryphosis: Secondary | ICD-10-CM | POA: Diagnosis not present

## 2014-09-12 ENCOUNTER — Telehealth: Payer: Self-pay | Admitting: Endocrinology

## 2014-09-12 ENCOUNTER — Other Ambulatory Visit (INDEPENDENT_AMBULATORY_CARE_PROVIDER_SITE_OTHER): Payer: Medicare Other

## 2014-09-12 ENCOUNTER — Other Ambulatory Visit: Payer: Self-pay | Admitting: *Deleted

## 2014-09-12 DIAGNOSIS — E1149 Type 2 diabetes mellitus with other diabetic neurological complication: Secondary | ICD-10-CM

## 2014-09-12 DIAGNOSIS — IMO0002 Reserved for concepts with insufficient information to code with codable children: Secondary | ICD-10-CM

## 2014-09-12 DIAGNOSIS — E1165 Type 2 diabetes mellitus with hyperglycemia: Principal | ICD-10-CM

## 2014-09-12 DIAGNOSIS — E782 Mixed hyperlipidemia: Secondary | ICD-10-CM

## 2014-09-12 LAB — COMPREHENSIVE METABOLIC PANEL
ALT: 25 U/L (ref 0–53)
AST: 21 U/L (ref 0–37)
Albumin: 3.9 g/dL (ref 3.5–5.2)
Alkaline Phosphatase: 84 U/L (ref 39–117)
BUN: 26 mg/dL — ABNORMAL HIGH (ref 6–23)
CO2: 22 mEq/L (ref 19–32)
Calcium: 9.8 mg/dL (ref 8.4–10.5)
Chloride: 108 mEq/L (ref 96–112)
Creatinine, Ser: 1.51 mg/dL — ABNORMAL HIGH (ref 0.40–1.50)
GFR: 48.24 mL/min — ABNORMAL LOW (ref >60.00)
Glucose, Bld: 204 mg/dL — ABNORMAL HIGH (ref 70–99)
Potassium: 4.4 mEq/L (ref 3.5–5.1)
Sodium: 137 mEq/L (ref 135–145)
Total Bilirubin: 0.4 mg/dL (ref 0.2–1.2)
Total Protein: 6.9 g/dL (ref 6.0–8.3)

## 2014-09-12 LAB — LIPID PANEL
Cholesterol: 159 mg/dL (ref 0–200)
HDL: 26.2 mg/dL — ABNORMAL LOW (ref >39.00)
NonHDL: 132.8
Total CHOL/HDL Ratio: 6
Triglycerides: 206 mg/dL — ABNORMAL HIGH (ref 0.0–149.0)
VLDL: 41.2 mg/dL — ABNORMAL HIGH (ref 0.0–40.0)

## 2014-09-12 LAB — HEMOGLOBIN A1C: Hgb A1c MFr Bld: 8.7 % — ABNORMAL HIGH (ref 4.6–6.5)

## 2014-09-12 LAB — LDL CHOLESTEROL, DIRECT: Direct LDL: 77 mg/dL

## 2014-09-12 MED ORDER — INSULIN ASPART 100 UNIT/ML ~~LOC~~ SOLN: SUBCUTANEOUS | Status: DC

## 2014-09-12 NOTE — Telephone Encounter (Signed)
rx sent

## 2014-09-12 NOTE — Telephone Encounter (Signed)
Refill rx for insulin to [facility] please

## 2014-09-16 ENCOUNTER — Encounter: Payer: Self-pay | Admitting: Endocrinology

## 2014-09-16 ENCOUNTER — Encounter: Payer: Self-pay | Admitting: *Deleted

## 2014-09-16 ENCOUNTER — Ambulatory Visit (INDEPENDENT_AMBULATORY_CARE_PROVIDER_SITE_OTHER): Payer: Medicare Other | Admitting: Endocrinology

## 2014-09-16 VITALS — BP 176/82 | HR 79 | Temp 98.0°F | Resp 16 | Ht 76.0 in | Wt 275.8 lb

## 2014-09-16 DIAGNOSIS — H35372 Puckering of macula, left eye: Secondary | ICD-10-CM | POA: Diagnosis not present

## 2014-09-16 DIAGNOSIS — E118 Type 2 diabetes mellitus with unspecified complications: Secondary | ICD-10-CM

## 2014-09-16 DIAGNOSIS — E119 Type 2 diabetes mellitus without complications: Secondary | ICD-10-CM | POA: Diagnosis not present

## 2014-09-16 DIAGNOSIS — H3563 Retinal hemorrhage, bilateral: Secondary | ICD-10-CM | POA: Diagnosis not present

## 2014-09-16 DIAGNOSIS — H11153 Pinguecula, bilateral: Secondary | ICD-10-CM | POA: Diagnosis not present

## 2014-09-16 DIAGNOSIS — E1165 Type 2 diabetes mellitus with hyperglycemia: Secondary | ICD-10-CM

## 2014-09-16 DIAGNOSIS — IMO0002 Reserved for concepts with insufficient information to code with codable children: Secondary | ICD-10-CM

## 2014-09-16 DIAGNOSIS — I10 Essential (primary) hypertension: Secondary | ICD-10-CM | POA: Diagnosis not present

## 2014-09-16 DIAGNOSIS — Z961 Presence of intraocular lens: Secondary | ICD-10-CM | POA: Diagnosis not present

## 2014-09-16 DIAGNOSIS — E11319 Type 2 diabetes mellitus with unspecified diabetic retinopathy without macular edema: Secondary | ICD-10-CM | POA: Diagnosis not present

## 2014-09-16 LAB — HM DIABETES EYE EXAM

## 2014-09-16 MED ORDER — DOXAZOSIN MESYLATE 4 MG PO TABS: 4.0000 mg | ORAL_TABLET | Freq: Every day | ORAL | Status: DC

## 2014-09-16 NOTE — Progress Notes (Signed)
Patient ID: David [name], male   DOB: [birth year], 74 y.o.   MRN: [id number]    Reason for Appointment: Followup for Type 2 Diabetes  Referring physician: Megan Salon  History of Present Illness:          Diagnosis: Type 2 diabetes mellitus, date of diagnosis:   1992       Past history:  He was initially treated with metformin and at some point also glipizide. His previous records from out of town are not available and no information is available about his level of control Apparently he was started on insulin in 1994 approximately because of poor control He has been on various insulin regimens over the last several years However even with insulin he has had poor control for at least the last 7 or 8 years. He does not know what his previous A1c levels have been. He had been continued on metformin and glipizide but metformin stopped about 2 years ago because of kidney function abnormality He had been taking Lantus 60 units twice a day with NovoLog previously and also Before his initial consultation in 6/15 he was on NovoLog twice a day and Humalog mix insulin  Because of poor control and large insulin doses he was started on Victoza in 6/15  Recent history:   INSULIN PUMP  regimen is as follows:   Basal rates: midnight: 3.5 6 AM = 3.8, 9 AM = 3.1, 3 PM = 3.3 and 7 PM = 3.4 Carbohydrate coverage 1:7 for lunch otherwise 1:5, correction 1:10 with target 120-150  Average total daily insulin 88+/-10 units, basal insulin is 86%  Since 04/28/14 he has been on a Medtronic insulin pump because of persistent poor control and high insulin requirement Initially with starting the pump his blood sugars had been significantly better but subsequently blood sugars have been again high mostly related to noncompliance  Current problems and blood sugar patterns:  Checking blood sugars a little more frequently now although not everyday and has on an average checked 3 times a day now as discussed  before  However has only occasional readings after meals especially at night and difficult to identify postprandial patterns; he has a few readings after lunch and supper and these appear to be reasonably good  Blood sugars are inconsistent at all times especially in the morning and may have some readings over 200  He thinks that his compliance with diet is inconsistent and some of his high readings are related to not watching his diet including at night  His blood sugars at lunchtime are somewhat variable but mostly over 140 as also at suppertime   He still has not been motivated to exercise as discussed  BOLUSES: These are inconsistent and he will miss boluses periodically at meals  Not covering high readings with boluses as discussed.  Only 47% of his boluses are with correction  He says that occasionally he will have his infusion set get pulled out during the night when he moves and wants a shorter infusion set  80% of his readings are above his target  He is also continuing to take 1.8 mg Victoza        Oral hypoglycemic drugs the patient is taking are: None, has renal dysfunction        Glucose monitoring:  done about 2-3 times a day         Glucometer:  contour     Blood Glucose readings from pump download:  PRE-MEAL Breakfast Lunch  Dinner Bedtime Overall  Glucose range: 117-260  145-214  127-201     Mean/median:      176+/-42    Hypoglycemia: None recently, able to tolerate lower blood sugars now without symptoms  Glycemic control:   Lab Results  Component Value Date   HGBA1C 8.7* 09/12/2014   HGBA1C 9.1* 06/02/2014   HGBA1C 8.5* 03/24/2014   Lab Results  Component Value Date   MICROALBUR 18.8* 11/20/2013   LDLCALC 49 10/29/2013   CREATININE 1.51* 09/12/2014    Self-care: The diet that the patient has been following is: tries to limit fat intake   Meals: 3 meals per day.breakfast is either cereal or eggs and toast, lunch is usually a sandwich , dinner at  6-7  pm        Exercise:  not doing exercise bike, previously  was doing 15-30 min        Dietician visit: Most recent: several years ago.               Compliance with the medical regimen: good Retinal exam: Most recent: 05/2013.    Weight history:  Wt Readings from Last 3 Encounters:  09/16/14 275 lb 12.8 oz (125.102 kg)  08/05/14 271 lb 12.8 oz (123.288 kg)  08/04/14 276 lb (125.193 kg)      Medication List       This list is accurate as of: 09/16/14 10:34 AM.  Always use your most recent med list.               amLODipine 5 MG tablet  Commonly known as:  NORVASC  Take 1 tablet (5 mg total) by mouth daily.     amoxicillin-clavulanate 875-125 MG per tablet  Commonly known as:  AUGMENTIN  Take 1 tablet by mouth 2 (two) times daily.     aspirin 81 MG tablet  Take 81 mg by mouth daily.     B-D ULTRAFINE III SHORT PEN 31G X 8 MM Misc  Generic drug:  Insulin Pen Needle     calcitRIOL 0.25 MCG capsule  Commonly known as:  ROCALTROL     carvedilol 25 MG tablet  Commonly known as:  COREG  Take 1 tablet (25 mg total) by mouth 2 (two) times daily with a meal.     ciprofloxacin-dexamethasone otic suspension  Commonly known as:  CIPRODEX  Place 4 drops into the left ear 2 (two) times daily.     doxazosin 4 MG tablet  Commonly known as:  CARDURA  Take 1 tablet (4 mg total) by mouth daily.     Fish Oil 1000 MG Caps  Take by mouth 2 (two) times daily.     glucose blood test strip  Commonly known as:  BAYER CONTOUR NEXT TEST  Use as instructed to check blood sugar 6 times per day dx code E11.69, patient is on insulin pump and is medically required to check 6 times per day.     insulin aspart 100 UNIT/ML injection  Commonly known as:  NOVOLOG  Use max 120 units per day with insulin pump.     isosorbide mononitrate 30 MG 24 hr tablet  Commonly known as:  IMDUR  TAKE TWO TABLETS BY MOUTH ONCE DAILY     Liraglutide 18 MG/3ML Sopn  Commonly known as:  VICTOZA  Inject 1.8  mg into the skin daily. Inject once daily at the same time     lisinopril 10 MG tablet  Commonly known as:  PRINIVIL,ZESTRIL  Take  1 tablet (10 mg total) by mouth daily.     mupirocin cream 2 %  Commonly known as:  BACTROBAN     neomycin-polymyxin-hydrocortisone otic solution  Commonly known as:  CORTISPORIN  Place 3 drops into the left ear 4 (four) times daily.     pantoprazole 40 MG tablet  Commonly known as:  PROTONIX  Take 1 tablet (40 mg total) by mouth daily.     torsemide 20 MG tablet  Commonly known as:  DEMADEX  TAKE TWO TABLETS BY MOUTH ONCE DAILY        Allergies: No Known Allergies  Past Medical History  Diagnosis Date  . Diabetes   . Hypertension   . CAD (coronary artery disease)   . Renal insufficiency   . Sleep apnea   . Palpitations   . Dyspnea   . Sinus bradycardia   . Obesity   . Allergy   . Kidney failure   . Arthritis   . Diabetes   . Anxiety   . Cancer     skin    Past Surgical History  Procedure Laterality Date  . Insert / replace / remove pacemaker    . Knee surgery    . Vein ligation and stripping    . 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 Cancer Mother     breast  . Heart attack Father     Social History:  reports that he has quit smoking. He has never used smokeless tobacco. He reports that he drinks about 1.2 oz of alcohol per week. He reports that he does not use illicit drugs.    Review of Systems       Lipids: Had been on a Lipitor since late 2014. Also had taken Gembrozil for several years without apparent side effects.  Both of these were  stopped because of increased liver functions and LFT has been back to normal  LDL has not been high subsequently but HDL is low Currently taking only fish oil Currently lipids are followed by PCP       Lab Results  Component Value Date   CHOL 159 09/12/2014   HDL 26.20* 09/12/2014   LDLCALC 49 10/29/2013    LDLDIRECT 77.0 09/12/2014   TRIG 206.0* 09/12/2014   CHOLHDL 6 09/12/2014      HYPERTENSION: The blood pressure has been treated with various drugs.  Currently taking 2 mg doxazosin, carvedilol, lisinopril and amlodipine  Blood pressure is unusually high today  Chronic kidney disease: Etiology unknown, is followed by nephrologist  Last creatinine:  Lab Results  Component Value Date   CREATININE 1.51* 09/12/2014        Has history of Numbness in his feet  since about 2013, no tingling or burning in feet       He has been seeing the podiatrist for a toe ulcer  Previous foot exam findings:   Vibration sense is absent in toes. Ankle jerks are absent bilaterally.          Diabetic foot exam:  No callus formation. Mild denudation of skin on left second toe superiorly Amputation of right third toe. Thickening and dryness of skin on the toes on the right Absent monofilament sensation in the feet Absent pedal pulses     Physical Examination:  BP 176/82 mmHg  Pulse 79  Temp(Src) 98 F (36.7 C)  Resp 16  Ht  6\' 4"  (1.93 m)  Wt 275 lb 12.8 oz (125.102 kg)  BMI 33.59 kg/m2  SpO2 97%  No ankle edema Repeat blood pressure 164/80    ASSESSMENT:  Diabetes type 2, uncontrolled with neuropathy. His blood sugars are overall somewhat better with increasing his basal rate on the last visit and is trying to check blood sugars more often A1c is improving gradually but A1c is reflecting sugars over the last 6 weeks and down to 8.7, previously 9.1 in the last 6 weeks poorly controlled even with the insulin pump As discussed in history of present illness he has difficulty complying with various aspects of day-to-day care but is trying to do better He can do better with  diet, exercise, bolusing consistently as discussed above He probably can get better control with exercise and he agrees to try and do this  PLAN:   Pump settings will not be changed as yet  Check blood sugars more often at  night including when having snacks  More consistent boluses for all meals and snacks  Start regular exercise  He will also discuss using a shorter infusion set with nurse educator to prevent any disconnection during the night  Take boluses for all high readings regardless of whether he is eating or not  Reviewed instructions in detail the patient  Hypertension: Blood pressure is not controlled and he can increase his Cardura to 4 mg, new prescription sent Continue follow-up with nephrologist  Counseling time on subjects discussed above is over 50% of today's 25 minute visit   Patient Instructions  Exerise daily  BOLUS FOR EVERY MEAL SNACK AND SUGAR OVER 150  DOXAZOSIN 4mg  in pm for BP, follow up    [facility] 09/16/2014, 10:34 AM   Note: This office note was prepared with Estate agent. Any transcriptional errors that result from this process are unintentional.

## 2014-09-16 NOTE — Patient Instructions (Addendum)
Exerise daily  BOLUS FOR EVERY MEAL SNACK AND SUGAR OVER 150  DOXAZOSIN 4mg  in pm for BP, follow up

## 2014-09-23 ENCOUNTER — Encounter: Payer: Self-pay | Admitting: *Deleted

## 2014-10-15 ENCOUNTER — Other Ambulatory Visit: Payer: Self-pay | Admitting: *Deleted

## 2014-10-15 MED ORDER — INSULIN ASPART 100 UNIT/ML ~~LOC~~ SOLN: SUBCUTANEOUS | Status: DC

## 2014-10-16 ENCOUNTER — Encounter: Payer: Self-pay | Admitting: Family

## 2014-10-16 ENCOUNTER — Ambulatory Visit (INDEPENDENT_AMBULATORY_CARE_PROVIDER_SITE_OTHER): Payer: Medicare Other | Admitting: Family

## 2014-10-16 ENCOUNTER — Other Ambulatory Visit (INDEPENDENT_AMBULATORY_CARE_PROVIDER_SITE_OTHER): Payer: Medicare Other

## 2014-10-16 VITALS — BP 130/58 | HR 80 | Temp 97.8°F | Resp 18 | Ht 76.0 in | Wt 271.0 lb

## 2014-10-16 DIAGNOSIS — H6123 Impacted cerumen, bilateral: Secondary | ICD-10-CM

## 2014-10-16 DIAGNOSIS — R208 Other disturbances of skin sensation: Secondary | ICD-10-CM | POA: Diagnosis not present

## 2014-10-16 DIAGNOSIS — R200 Anesthesia of skin: Secondary | ICD-10-CM | POA: Insufficient documentation

## 2014-10-16 DIAGNOSIS — E782 Mixed hyperlipidemia: Secondary | ICD-10-CM | POA: Diagnosis not present

## 2014-10-16 DIAGNOSIS — I10 Essential (primary) hypertension: Secondary | ICD-10-CM

## 2014-10-16 DIAGNOSIS — M549 Dorsalgia, unspecified: Secondary | ICD-10-CM | POA: Insufficient documentation

## 2014-10-16 DIAGNOSIS — M545 Low back pain, unspecified: Secondary | ICD-10-CM

## 2014-10-16 DIAGNOSIS — R5383 Other fatigue: Secondary | ICD-10-CM | POA: Diagnosis not present

## 2014-10-16 LAB — CBC
HCT: 41.8 % (ref 39.0–52.0)
Hemoglobin: 13.9 g/dL (ref 13.0–17.0)
MCHC: 33.3 g/dL (ref 30.0–36.0)
MCV: 91.9 fl (ref 78.0–100.0)
Platelets: 180 10*3/uL (ref 150.0–400.0)
RBC: 4.55 Mil/uL (ref 4.22–5.81)
RDW: 15.3 % (ref 11.5–15.5)
WBC: 7.3 10*3/uL (ref 4.0–10.5)

## 2014-10-16 LAB — BASIC METABOLIC PANEL
BUN: 34 mg/dL — ABNORMAL HIGH (ref 6–23)
CO2: 26 mEq/L (ref 19–32)
Calcium: 9.5 mg/dL (ref 8.4–10.5)
Chloride: 106 mEq/L (ref 96–112)
Creatinine, Ser: 1.7 mg/dL — ABNORMAL HIGH (ref 0.40–1.50)
GFR: 42.07 mL/min — ABNORMAL LOW (ref >60.00)
Glucose, Bld: 193 mg/dL — ABNORMAL HIGH (ref 70–99)
Potassium: 4.7 mEq/L (ref 3.5–5.1)
Sodium: 138 mEq/L (ref 135–145)

## 2014-10-16 LAB — COMPREHENSIVE METABOLIC PANEL
ALT: 22 U/L (ref 0–53)
AST: 17 U/L (ref 0–37)
Albumin: 4 g/dL (ref 3.5–5.2)
Alkaline Phosphatase: 85 U/L (ref 39–117)
BUN: 34 mg/dL — ABNORMAL HIGH (ref 6–23)
CO2: 26 mEq/L (ref 19–32)
Calcium: 9.5 mg/dL (ref 8.4–10.5)
Chloride: 106 mEq/L (ref 96–112)
Creatinine, Ser: 1.7 mg/dL — ABNORMAL HIGH (ref 0.40–1.50)
GFR: 42.07 mL/min — ABNORMAL LOW (ref >60.00)
Glucose, Bld: 193 mg/dL — ABNORMAL HIGH (ref 70–99)
Potassium: 4.7 mEq/L (ref 3.5–5.1)
Sodium: 138 mEq/L (ref 135–145)
Total Bilirubin: 0.4 mg/dL (ref 0.2–1.2)
Total Protein: 7.1 g/dL (ref 6.0–8.3)

## 2014-10-16 LAB — TSH: TSH: 1.51 u[IU]/mL (ref 0.35–4.50)

## 2014-10-16 NOTE — Patient Instructions (Signed)
Thank you for choosing [facility].  Summary/Instructions:  Please stop by the lab on the basement level of the building for your blood work. Your results will be released to [facility] (or called to you) after review, usually within 72 hours after test completion. If any changes need to be made, you will be notified at that same time.  If your symptoms worsen or fail to improve, please contact our office for further instruction, or in case of emergency go directly to the emergency room at the closest medical facility.    Fatigue Fatigue is a feeling of tiredness, lack of energy, lack of motivation, or feeling tired all the time. Having enough rest, good nutrition, and reducing stress will normally reduce fatigue. Consult your caregiver if it persists. The nature of your fatigue will help your caregiver to find out its cause. The treatment is based on the cause.  CAUSES  There are many causes for fatigue. Most of the time, fatigue can be traced to one or more of your habits or routines. Most causes fit into one or more of three general areas. They are: Lifestyle problems  Sleep disturbances.  Overwork.  Physical exertion.  Unhealthy habits.  Poor eating habits or eating disorders.  Alcohol and/or drug use .  Lack of proper nutrition (malnutrition). Psychological problems  Stress and/or anxiety problems.  Depression.  Grief.  Boredom. Medical Problems or Conditions  Anemia.  Pregnancy.  Thyroid gland problems.  Recovery from major surgery.  Continuous pain.  Emphysema or asthma that is not well controlled  Allergic conditions.  Diabetes.  Infections (such as mononucleosis).  Obesity.  Sleep disorders, such as sleep apnea.  Heart failure or other heart-related problems.  Cancer.  Kidney disease.  Liver disease.  Effects of certain medicines such as antihistamines, cough and cold remedies, prescription pain medicines, heart and blood pressure  medicines, drugs used for treatment of cancer, and some antidepressants. SYMPTOMS  The symptoms of fatigue include:   Lack of energy.  Lack of drive (motivation).  Drowsiness.  Feeling of indifference to the surroundings. DIAGNOSIS  The details of how you feel help guide your caregiver in finding out what is causing the fatigue. You will be asked about your present and past health condition. It is important to review all medicines that you take, including prescription and non-prescription items. A thorough exam will be done. You will be questioned about your feelings, habits, and normal lifestyle. Your caregiver may suggest blood tests, urine tests, or other tests to look for common medical causes of fatigue.  TREATMENT  Fatigue is treated by correcting the underlying cause. For example, if you have continuous pain or depression, treating these causes will improve how you feel. Similarly, adjusting the dose of certain medicines will help in reducing fatigue.  HOME CARE INSTRUCTIONS   Try to get the required amount of good sleep every night.  Eat a healthy and nutritious diet, and drink enough water throughout the day.  Practice ways of relaxing (including yoga or meditation).  Exercise regularly.  Make plans to change situations that cause stress. Act on those plans so that stresses decrease over time. Keep your work and personal routine reasonable.  Avoid street drugs and minimize use of alcohol.  Start taking a daily multivitamin after consulting your caregiver. SEEK MEDICAL CARE IF:   You have persistent tiredness, which cannot be accounted for.  You have fever.  You have unintentional weight loss.  You have headaches.  You have disturbed sleep  throughout the night.  You are feeling sad.  You have constipation.  You have dry skin.  You have gained weight.  You are taking any new or different medicines that you suspect are causing fatigue.  You are unable to  sleep at night.  You develop any unusual swelling of your legs or other parts of your body. SEEK IMMEDIATE MEDICAL CARE IF:   You are feeling confused.  Your vision is blurred.  You feel faint or pass out.  You develop severe headache.  You develop severe abdominal, pelvic, or back pain.  You develop chest pain, shortness of breath, or an irregular or fast heartbeat.  You are unable to pass a normal amount of urine.  You develop abnormal bleeding such as bleeding from the rectum or you vomit blood.  You have thoughts about harming yourself or committing suicide.  You are worried that you might harm someone else. MAKE SURE YOU:   Understand these instructions.  Will watch your condition.  Will get help right away if you are not doing well or get worse. Document Released: 02/27/2007 Document Revised: 07/25/2011 Document Reviewed: 09/03/2013 [facility] Patient Information 2015 [employer], [place]. This information is not intended to replace advice given to you by your health care provider. Make sure you discuss any questions you have with your health care provider.

## 2014-10-16 NOTE — Progress Notes (Signed)
Subjective:    Patient ID: David [name], male    DOB: 07/30/1940, 74 y.o.   MRN: [id number]  Chief Complaint  Patient presents with  . Numbness    on sunday says he got out of the car and his left leg was numb like it wasn't there, fatigue and headache episodes, having issues with lower right back pain     HPI:  David [name] is a 74 y.o. male with a PMH of type 2 diabetes, hypertension, renal insufficiency, obesity, hyperlipidemia who presents today for an acute office visit.  1.) Numbness - Associated symptom of numbness located in his left leg started on Sunday when he got out of the car. He was attempting to get out of the car and noted that he could not feel his left leg. This resolved with time and the numbness decreased and gradually returned to normal. He does have neuropathy.   2.) Low back pain - Associated symptom of low back pain located on the right side of his back has been going on for a couple of years. He notes that he was picked up by EMS and was strapped into a basket and was laying on it for a ride to the hospital. Indicates by the time he got to the hospital he had fairly significant back pain. Describes the pain as sharp with a severity of 6-7/10 mainly noticed when he turns a certain. His functionality is limited because of multiple medical conditions.  3.) Fatigue - Associated symptom of fatigue has been going on for several months. Has an associated headache. Modifying factor includes sleep which helps him feel back to normal.   4.) Impacted cerumen - patient requesting to have ears cleaned.  No Known Allergies   Current Outpatient Prescriptions on File Prior to Visit  Medication Sig Dispense Refill  . amLODipine (NORVASC) 5 MG tablet Take 1 tablet (5 mg total) by mouth daily. 180 tablet 2  . aspirin 81 MG tablet Take 81 mg by mouth daily.    . B-D ULTRAFINE III SHORT PEN 31G X 8 MM MISC     . calcitRIOL (ROCALTROL) 0.25 MCG capsule     . carvedilol (COREG)  25 MG tablet Take 1 tablet (25 mg total) by mouth 2 (two) times daily with a meal. 90 tablet 1  . ciprofloxacin-dexamethasone (CIPRODEX) otic suspension Place 4 drops into the left ear 2 (two) times daily. 7.5 mL 0  . doxazosin (CARDURA) 4 MG tablet Take 1 tablet (4 mg total) by mouth daily. 90 tablet 1  . glucose blood (BAYER CONTOUR NEXT TEST) test strip Use as instructed to check blood sugar 6 times per day dx code E11.69, patient is on insulin pump and is medically required to check 6 times per day. 200 each 3  . insulin aspart (NOVOLOG) 100 UNIT/ML injection Use max 120 units per day with insulin pump. 40 mL 5  . isosorbide mononitrate (IMDUR) 30 MG 24 hr tablet TAKE TWO TABLETS BY MOUTH ONCE DAILY 60 tablet 0  . Liraglutide (VICTOZA) 18 MG/3ML SOPN Inject 1.8 mg into the skin daily. Inject once daily at the same time 3 pen 3  . lisinopril (PRINIVIL,ZESTRIL) 10 MG tablet Take 1 tablet (10 mg total) by mouth daily. 90 tablet 0  . mupirocin cream (BACTROBAN) 2 %     . neomycin-polymyxin-hydrocortisone (CORTISPORIN) otic solution Place 3 drops into the left ear 4 (four) times daily. 10 mL 0  . Omega-3 Fatty Acids (FISH OIL) 1000  MG CAPS Take by mouth 2 (two) times daily.    . pantoprazole (PROTONIX) 40 MG tablet Take 1 tablet (40 mg total) by mouth daily. 90 tablet 1  . torsemide (DEMADEX) 20 MG tablet TAKE TWO TABLETS BY MOUTH ONCE DAILY 60 tablet 6   No current facility-administered medications on file prior to visit.    Review of Systems  Constitutional: Positive for fatigue. Negative for fever and chills.  Musculoskeletal: Positive for back pain.  Neurological: Positive for weakness, numbness and headaches.      Objective:    BP 130/58 mmHg  Pulse 80  Temp(Src) 97.8 F (36.6 C) (Oral)  Resp 18  Ht 6\' 4"  (1.93 m)  Wt 271 lb (122.925 kg)  BMI 33.00 kg/m2  SpO2 95% Nursing note and vital signs reviewed.  Physical Exam  Constitutional: He is oriented to person, place, and time.  He appears well-developed and well-nourished. No distress.  Cardiovascular: Normal rate, regular rhythm, normal heart sounds and intact distal pulses.   Pulmonary/Chest: Effort normal and breath sounds normal.  Musculoskeletal:  Low back: No obvious deformity, discoloration, or edema of low back noted. Palpable tenderness elicited over the right lumbar paraspinal musculature. Displays full range of motion with minimal pain. Some discomfort with lateral bending. Straight leg raise is positive on the right side. Distal pulses, sensation, and reflexes are intact and appropriate.  Neurological: He is alert and oriented to person, place, and time.  Skin: Skin is warm and dry.  Psychiatric: He has a normal mood and affect. His behavior is normal. Judgment and thought content normal.       Assessment & Plan:   Problem List Items Addressed This Visit      Nervous and Auditory   Impacted cerumen of both ears    Both ears were cleaned out with warm water mixed with Docusate Sodium. There was wax return from both ears and ear drum was visible. Patient tolerated this well.          Other   Back pain - Primary    Symptoms and exam consistent with low back strain/muscle tightness. Continue over-the-counter medications as needed for symptom relief and supportive care. Recommend heat and possible TENS therapy. Stretch 2-3 times per day. Follow-up if symptoms worsen or fail to improve.      Left leg numbness    Left lower leg numbness of undetermined cause. Initial episode lasted approximately 10 minutes and has had not occurred since. Cannot rule out nerve impingement or a TIA. Continue to monitor at this time as patient is asymptomatic. Follow-up if symptoms return.      Fatigue    Fatigue of undetermined origin. Given previous medical history cannot rule out diabetes or hypertension. Obtain basic metabolic panel, CBC, and TSH to rule out other metabolic causes. Follow up if symptoms worsen or fail  to improve pending lab work.      Relevant Orders   Comprehensive metabolic panel   CBC   TSH   Basic metabolic panel

## 2014-10-16 NOTE — Assessment & Plan Note (Signed)
Left lower leg numbness of undetermined cause. Initial episode lasted approximately 10 minutes and has had not occurred since. Cannot rule out nerve impingement or a TIA. Continue to monitor at this time as patient is asymptomatic. Follow-up if symptoms return.

## 2014-10-16 NOTE — Assessment & Plan Note (Signed)
Both ears were cleaned out with warm water mixed with Docusate Sodium. There was wax return from both ears and ear drum was visible. Patient tolerated this well.

## 2014-10-16 NOTE — Assessment & Plan Note (Signed)
Symptoms and exam consistent with low back strain/muscle tightness. Continue over-the-counter medications as needed for symptom relief and supportive care. Recommend heat and possible TENS therapy. Stretch 2-3 times per day. Follow-up if symptoms worsen or fail to improve.

## 2014-10-16 NOTE — Progress Notes (Signed)
Pre visit review using our clinic review tool, if applicable. No additional management support is needed unless otherwise documented below in the visit note. 

## 2014-10-16 NOTE — Assessment & Plan Note (Signed)
Fatigue of undetermined origin. Given previous medical history cannot rule out diabetes or hypertension. Obtain basic metabolic panel, CBC, and TSH to rule out other metabolic causes. Follow up if symptoms worsen or fail to improve pending lab work.

## 2014-10-17 ENCOUNTER — Encounter: Payer: Self-pay | Admitting: Family

## 2014-10-25 ENCOUNTER — Inpatient Hospital Stay (HOSPITAL_COMMUNITY)
Admission: EM | Admit: 2014-10-25 | Discharge: 2014-10-29 | DRG: 603 | Disposition: A | Discharge status: Home / Self Care | Payer: Medicare Other | Attending: Internal Medicine | Admitting: Internal Medicine

## 2014-10-25 ENCOUNTER — Encounter (HOSPITAL_COMMUNITY): Payer: Self-pay | Admitting: Emergency Medicine

## 2014-10-25 DIAGNOSIS — Z89421 Acquired absence of other right toe(s): Secondary | ICD-10-CM

## 2014-10-25 DIAGNOSIS — E1165 Type 2 diabetes mellitus with hyperglycemia: Secondary | ICD-10-CM | POA: Diagnosis present

## 2014-10-25 DIAGNOSIS — M19071 Primary osteoarthritis, right ankle and foot: Secondary | ICD-10-CM | POA: Diagnosis not present

## 2014-10-25 DIAGNOSIS — S82832A Other fracture of upper and lower end of left fibula, initial encounter for closed fracture: Secondary | ICD-10-CM | POA: Diagnosis not present

## 2014-10-25 DIAGNOSIS — G4733 Obstructive sleep apnea (adult) (pediatric): Secondary | ICD-10-CM

## 2014-10-25 DIAGNOSIS — N179 Acute kidney failure, unspecified: Secondary | ICD-10-CM | POA: Diagnosis not present

## 2014-10-25 DIAGNOSIS — N189 Chronic kidney disease, unspecified: Secondary | ICD-10-CM

## 2014-10-25 DIAGNOSIS — S82892A Other fracture of left lower leg, initial encounter for closed fracture: Secondary | ICD-10-CM | POA: Diagnosis present

## 2014-10-25 DIAGNOSIS — E1159 Type 2 diabetes mellitus with other circulatory complications: Secondary | ICD-10-CM | POA: Diagnosis present

## 2014-10-25 DIAGNOSIS — I2510 Atherosclerotic heart disease of native coronary artery without angina pectoris: Secondary | ICD-10-CM | POA: Diagnosis present

## 2014-10-25 DIAGNOSIS — Z9989 Dependence on other enabling machines and devices: Secondary | ICD-10-CM

## 2014-10-25 DIAGNOSIS — E11621 Type 2 diabetes mellitus with foot ulcer: Secondary | ICD-10-CM | POA: Diagnosis not present

## 2014-10-25 DIAGNOSIS — Y92239 Unspecified place in hospital as the place of occurrence of the external cause: Secondary | ICD-10-CM

## 2014-10-25 DIAGNOSIS — I10 Essential (primary) hypertension: Secondary | ICD-10-CM

## 2014-10-25 DIAGNOSIS — Z87891 Personal history of nicotine dependence: Secondary | ICD-10-CM

## 2014-10-25 DIAGNOSIS — N183 Chronic kidney disease, stage 3 unspecified: Secondary | ICD-10-CM

## 2014-10-25 DIAGNOSIS — S91109A Unspecified open wound of unspecified toe(s) without damage to nail, initial encounter: Secondary | ICD-10-CM | POA: Diagnosis present

## 2014-10-25 DIAGNOSIS — R6889 Other general symptoms and signs: Secondary | ICD-10-CM | POA: Diagnosis not present

## 2014-10-25 DIAGNOSIS — J111 Influenza due to unidentified influenza virus with other respiratory manifestations: Secondary | ICD-10-CM | POA: Diagnosis not present

## 2014-10-25 DIAGNOSIS — L97519 Non-pressure chronic ulcer of other part of right foot with unspecified severity: Secondary | ICD-10-CM | POA: Diagnosis present

## 2014-10-25 DIAGNOSIS — L03031 Cellulitis of right toe: Secondary | ICD-10-CM | POA: Diagnosis present

## 2014-10-25 DIAGNOSIS — F419 Anxiety disorder, unspecified: Secondary | ICD-10-CM | POA: Diagnosis present

## 2014-10-25 DIAGNOSIS — E669 Obesity, unspecified: Secondary | ICD-10-CM | POA: Diagnosis present

## 2014-10-25 DIAGNOSIS — A4902 Methicillin resistant Staphylococcus aureus infection, unspecified site: Secondary | ICD-10-CM

## 2014-10-25 DIAGNOSIS — S82402A Unspecified fracture of shaft of left fibula, initial encounter for closed fracture: Secondary | ICD-10-CM | POA: Diagnosis present

## 2014-10-25 DIAGNOSIS — Z794 Long term (current) use of insulin: Secondary | ICD-10-CM

## 2014-10-25 DIAGNOSIS — L539 Erythematous condition, unspecified: Secondary | ICD-10-CM | POA: Diagnosis not present

## 2014-10-25 DIAGNOSIS — I152 Hypertension secondary to endocrine disorders: Secondary | ICD-10-CM | POA: Diagnosis present

## 2014-10-25 DIAGNOSIS — E1122 Type 2 diabetes mellitus with diabetic chronic kidney disease: Secondary | ICD-10-CM

## 2014-10-25 DIAGNOSIS — Z9641 Presence of insulin pump (external) (internal): Secondary | ICD-10-CM | POA: Diagnosis present

## 2014-10-25 DIAGNOSIS — E1140 Type 2 diabetes mellitus with diabetic neuropathy, unspecified: Secondary | ICD-10-CM | POA: Diagnosis present

## 2014-10-25 DIAGNOSIS — I129 Hypertensive chronic kidney disease with stage 1 through stage 4 chronic kidney disease, or unspecified chronic kidney disease: Secondary | ICD-10-CM | POA: Diagnosis present

## 2014-10-25 DIAGNOSIS — W19XXXA Unspecified fall, initial encounter: Secondary | ICD-10-CM | POA: Diagnosis not present

## 2014-10-25 DIAGNOSIS — Z6835 Body mass index (BMI) 35.0-35.9, adult: Secondary | ICD-10-CM

## 2014-10-25 DIAGNOSIS — Z7982 Long term (current) use of aspirin: Secondary | ICD-10-CM

## 2014-10-25 DIAGNOSIS — R52 Pain, unspecified: Secondary | ICD-10-CM

## 2014-10-25 DIAGNOSIS — B9562 Methicillin resistant Staphylococcus aureus infection as the cause of diseases classified elsewhere: Secondary | ICD-10-CM

## 2014-10-25 DIAGNOSIS — L03115 Cellulitis of right lower limb: Secondary | ICD-10-CM | POA: Diagnosis not present

## 2014-10-25 DIAGNOSIS — L089 Local infection of the skin and subcutaneous tissue, unspecified: Secondary | ICD-10-CM | POA: Diagnosis not present

## 2014-10-25 DIAGNOSIS — E118 Type 2 diabetes mellitus with unspecified complications: Secondary | ICD-10-CM

## 2014-10-25 LAB — CBC WITH DIFFERENTIAL/PLATELET
Basophils Absolute: 0 10*3/uL (ref 0.0–0.1)
Basophils Relative: 0 % (ref 0–1)
Eosinophils Absolute: 0.1 10*3/uL (ref 0.0–0.7)
Eosinophils Relative: 1 % (ref 0–5)
HCT: 38.3 % — ABNORMAL LOW (ref 39.0–52.0)
Hemoglobin: 12.5 g/dL — ABNORMAL LOW (ref 13.0–17.0)
Lymphocytes Relative: 5 % — ABNORMAL LOW (ref 12–46)
Lymphs Abs: 0.6 10*3/uL — ABNORMAL LOW (ref 0.7–4.0)
MCH: 30.3 pg (ref 26.0–34.0)
MCHC: 32.6 g/dL (ref 30.0–36.0)
MCV: 92.7 fL (ref 78.0–100.0)
Monocytes Absolute: 1.1 10*3/uL — ABNORMAL HIGH (ref 0.1–1.0)
Monocytes Relative: 10 % (ref 3–12)
Neutro Abs: 9 10*3/uL — ABNORMAL HIGH (ref 1.7–7.7)
Neutrophils Relative %: 84 % — ABNORMAL HIGH (ref 43–77)
Platelets: 143 10*3/uL — ABNORMAL LOW (ref 150–400)
RBC: 4.13 MIL/uL — ABNORMAL LOW (ref 4.22–5.81)
RDW: 14.1 % (ref 11.5–15.5)
WBC: 10.8 10*3/uL — ABNORMAL HIGH (ref 4.0–10.5)

## 2014-10-25 LAB — BASIC METABOLIC PANEL
Anion gap: 9 (ref 5–15)
BUN: 39 mg/dL — ABNORMAL HIGH (ref 6–20)
CO2: 23 mmol/L (ref 22–32)
Calcium: 9.2 mg/dL (ref 8.9–10.3)
Chloride: 105 mmol/L (ref 101–111)
Creatinine, Ser: 1.86 mg/dL — ABNORMAL HIGH (ref 0.61–1.24)
GFR calc Af Amer: 39 mL/min — ABNORMAL LOW (ref >60)
GFR calc non Af Amer: 34 mL/min — ABNORMAL LOW (ref >60)
Glucose, Bld: 133 mg/dL — ABNORMAL HIGH (ref 65–99)
Potassium: 5.1 mmol/L (ref 3.5–5.1)
Sodium: 137 mmol/L (ref 135–145)

## 2014-10-25 LAB — URINALYSIS, ROUTINE W REFLEX MICROSCOPIC
Bilirubin Urine: NEGATIVE
Glucose, UA: NEGATIVE mg/dL
Ketones, ur: NEGATIVE mg/dL
Leukocytes, UA: NEGATIVE
Nitrite: NEGATIVE
Protein, ur: 30 mg/dL — AB
Specific Gravity, Urine: 1.018 (ref 1.005–1.030)
Urobilinogen, UA: 0.2 mg/dL (ref 0.0–1.0)
pH: 5 (ref 5.0–8.0)

## 2014-10-25 LAB — I-STAT CG4 LACTIC ACID, ED: Lactic Acid, Venous: 1.33 mmol/L (ref 0.5–2.0)

## 2014-10-25 LAB — CBG MONITORING, ED
Glucose-Capillary: 124 mg/dL — ABNORMAL HIGH (ref 65–99)
Glucose-Capillary: 139 mg/dL — ABNORMAL HIGH (ref 65–99)

## 2014-10-25 LAB — URINE MICROSCOPIC-ADD ON

## 2014-10-25 MED ORDER — ACETAMINOPHEN 500 MG PO TABS
1000.0000 mg | ORAL_TABLET | Freq: Once | ORAL | Status: AC
  Administered 2014-10-25: 1000 mg via ORAL
  Filled 2014-10-25: qty 2

## 2014-10-25 MED ORDER — VANCOMYCIN HCL 10 G IV SOLR
1750.0000 mg | Freq: Once | INTRAVENOUS | Status: AC
  Administered 2014-10-25: 1750 mg via INTRAVENOUS
  Filled 2014-10-25: qty 1750

## 2014-10-25 MED ORDER — SODIUM CHLORIDE 0.9 % IV BOLUS (SEPSIS)
1000.0000 mL | Freq: Once | INTRAVENOUS | Status: AC
  Administered 2014-10-25: 1000 mL via INTRAVENOUS

## 2014-10-25 NOTE — ED Provider Notes (Signed)
CSN: [id number]     Arrival date & time 10/25/14  2208 History   First MD Initiated Contact with Patient 10/25/14 2209     Chief Complaint  Patient presents with  . Fever     (Consider location/radiation/quality/duration/timing/severity/associated sxs/prior Treatment) HPI Comments: 74 year old male with history of diabetes uncontrolled, high blood pressure, cholesterol, obesity, renal insufficiency, neuropathy presents with fever and chills and generally not feeling well since yesterday evening. Patient denies respiratory or abdominal symptoms, no urinary symptoms. Patient has had mild warmth and redness to great toe on the right new today. No current antibodies. Patient's symptoms gradually worsened since yesterday. Past smoker.  Patient is a 74 y.o. male presenting with fever. The history is provided by the patient.  Fever Associated symptoms: chills, headaches and rash   Associated symptoms: no chest pain, no congestion, no dysuria and no vomiting     Past Medical History  Diagnosis Date  . Diabetes   . Hypertension   . CAD (coronary artery disease)   . Renal insufficiency   . Sleep apnea   . Palpitations   . Dyspnea   . Sinus bradycardia   . Obesity   . Allergy   . Kidney failure   . Arthritis   . Diabetes   . Anxiety   . Cancer     skin   Past Surgical History  Procedure Laterality Date  . Insert / replace / remove pacemaker    . Knee surgery    . Vein ligation and stripping    . 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 Cancer Mother     breast  . Heart attack Father    History  Substance Use Topics  . Smoking status: Former Smoker -- 2.00 packs/day for 30 years  . Smokeless tobacco: Never Used  . Alcohol Use: 1.2 oz/week    2 Glasses of wine per week    Review of Systems  Constitutional: Positive for fever, chills, appetite change and fatigue.  HENT: Negative for congestion.   Eyes: Negative  for visual disturbance.  Respiratory: Negative for shortness of breath.   Cardiovascular: Negative for chest pain.  Gastrointestinal: Negative for vomiting and abdominal pain.  Genitourinary: Negative for dysuria and flank pain.  Musculoskeletal: Positive for arthralgias. Negative for back pain, neck pain and neck stiffness.  Skin: Positive for rash.  Neurological: Positive for headaches. Negative for light-headedness.      Allergies  Review of patient's allergies indicates no known allergies.  Home Medications   Prior to Admission medications   Medication Sig Start Date End Date Taking? Authorizing Provider  acetaminophen (TYLENOL) 500 MG tablet Take 1,000 mg by mouth every 6 (six) hours as needed for fever.   Yes Historical Provider, MD  amLODipine (NORVASC) 5 MG tablet Take 1 tablet (5 mg total) by mouth daily. 01/22/14  Yes [name], MD  aspirin 81 MG tablet Take 81 mg by mouth daily.   Yes Historical Provider, MD  calcitRIOL (ROCALTROL) 0.25 MCG capsule Take 0.25 mcg by mouth daily.  05/30/14  Yes Historical Provider, MD  carvedilol (COREG) 25 MG tablet Take 1 tablet (25 mg total) by mouth 2 (two) times daily with a meal. 08/04/14  Yes [name], FNP  doxazosin (CARDURA) 4 MG tablet Take 1 tablet (4 mg total) by mouth daily. 09/16/14  Yes Elayne Snare, MD  insulin aspart (NOVOLOG) 100  UNIT/ML injection Use max 120 units per day with insulin pump. 10/15/14  Yes Elayne Snare, MD  isosorbide mononitrate (IMDUR) 30 MG 24 hr tablet TAKE TWO TABLETS BY MOUTH ONCE DAILY 12/24/13  Yes [name], MD  Liraglutide (VICTOZA) 18 MG/3ML SOPN Inject 1.8 mg into the skin daily. Inject once daily at the same time 04/28/14  Yes Elayne Snare, MD  lisinopril (PRINIVIL,ZESTRIL) 10 MG tablet Take 1 tablet (10 mg total) by mouth daily. 08/04/14  Yes [name], FNP  mupirocin cream (BACTROBAN) 2 % Apply 1 application topically 2 (two) times daily. Applied to the toe 04/14/14  Yes Historical  Provider, MD  Omega-3 Fatty Acids (FISH OIL) 1000 MG CAPS Take by mouth 2 (two) times daily.   Yes Historical Provider, MD  pantoprazole (PROTONIX) 40 MG tablet Take 1 tablet (40 mg total) by mouth daily. 08/04/14  Yes [name], FNP  torsemide (DEMADEX) 20 MG tablet TAKE TWO TABLETS BY MOUTH ONCE DAILY 02/03/14  Yes [name], MD  B-D ULTRAFINE III SHORT PEN 31G X 8 MM MISC  07/29/13   Historical Provider, MD  ciprofloxacin-dexamethasone (CIPRODEX) otic suspension Place 4 drops into the left ear 2 (two) times daily. Patient not taking: Reported on 10/25/2014 08/04/14   [name], FNP  glucose blood (BAYER CONTOUR NEXT TEST) test strip Use as instructed to check blood sugar 6 times per day dx code E11.69, patient is on insulin pump and is medically required to check 6 times per day. 08/28/14   Elayne Snare, MD  neomycin-polymyxin-hydrocortisone (CORTISPORIN) otic solution Place 3 drops into the left ear 4 (four) times daily. Patient not taking: Reported on 10/25/2014 08/05/14   [name], FNP   BP 134/66 mmHg  Pulse 61  Temp(Src) 102 F (38.9 C) (Rectal)  Resp 18  Ht 6' (1.829 m)  Wt 270 lb (122.471 kg)  BMI 36.61 kg/m2  SpO2 98% Physical Exam  Constitutional: He is oriented to person, place, and time. He appears well-developed and well-nourished.  HENT:  Head: Normocephalic and atraumatic.  Dry mucous membranes, no meningismus  Eyes: Conjunctivae are normal. Right eye exhibits no discharge. Left eye exhibits no discharge.  Neck: Normal range of motion. Neck supple. No tracheal deviation present.  Cardiovascular: Normal rate and regular rhythm.   Pulmonary/Chest: Effort normal and breath sounds normal.  Abdominal: Soft. He exhibits no distension. There is no tenderness. There is no guarding.  Musculoskeletal: He exhibits edema and tenderness.  Neurological: He is alert and oriented to person, place, and time. No cranial nerve deficit.  Skin: Skin is warm. Rash noted.   Patient has erythema, tenderness and warmth to distal and medial aspect of right great toe. Mild swelling distal foot. No crepitus. No significant drainage. Patient has mild warmth and erythema distal right extremity  Psychiatric: He has a normal mood and affect.  Nursing note and vitals reviewed.   ED Course  Procedures (including critical care time) Labs Review Labs Reviewed  BASIC METABOLIC PANEL - Abnormal; Notable for the following:    Glucose, Bld 133 (*)    BUN 39 (*)    Creatinine, Ser 1.86 (*)    GFR calc non Af Amer 34 (*)    GFR calc Af Amer 39 (*)    All other components within normal limits  CBC WITH DIFFERENTIAL/PLATELET - Abnormal; Notable for the following:    WBC 10.8 (*)    RBC 4.13 (*)    Hemoglobin 12.5 (*)  HCT 38.3 (*)    Platelets 143 (*)    Neutrophils Relative % 84 (*)    Neutro Abs 9.0 (*)    Lymphocytes Relative 5 (*)    Lymphs Abs 0.6 (*)    Monocytes Absolute 1.1 (*)    All other components within normal limits  URINALYSIS, ROUTINE W REFLEX MICROSCOPIC (NOT AT [facility]) - Abnormal; Notable for the following:    Hgb urine dipstick TRACE (*)    Protein, ur 30 (*)    All other components within normal limits  CBG MONITORING, ED - Abnormal; Notable for the following:    Glucose-Capillary 139 (*)    All other components within normal limits  CULTURE, BLOOD (ROUTINE X 2)  CULTURE, BLOOD (ROUTINE X 2)  URINE CULTURE  URINE MICROSCOPIC-ADD ON  I-STAT CG4 LACTIC ACID, ED    Imaging Review No results found.   EKG Interpretation None      MDM   Final diagnoses:  Right foot infection  Cellulitis of right foot  CRF Patient presents with concern for cellulitis, uncontrolled diabetes history. Plan for blood cultures, thank a mycin, basic blood work and IV fluid bolus.  Hospitalist will come assess the patient in ER. The patients results and plan were reviewed and discussed.   Any x-rays performed were personally reviewed by myself.    Differential diagnosis were considered with the presenting HPI.  Medications  vancomycin (VANCOCIN) 1,750 mg in sodium chloride 0.9 % 500 mL IVPB (1,750 mg Intravenous New Bag/Given 10/25/14 2256)  sodium chloride 0.9 % bolus 1,000 mL (1,000 mLs Intravenous New Bag/Given 10/25/14 2249)  acetaminophen (TYLENOL) tablet 1,000 mg (1,000 mg Oral Given 10/25/14 2255)    Filed Vitals:   10/25/14 2211 10/25/14 2218 10/25/14 2230 10/25/14 2344  BP: 156/64  138/103 134/66  Pulse: 64  64 61  Temp: 102.6 F (39.2 C) 102 F (38.9 C)    TempSrc: Oral Rectal    Resp: 18   18  Height: 6' (1.829 m)     Weight: 270 lb (122.471 kg)     SpO2: 97%  95% 98%    Final diagnoses:  Right foot infection  Cellulitis of right foot    Admission/ observation were discussed with the admitting physician, patient and/or family and they are comfortable with the plan.     Elnora Morrison, MD 10/25/14 [id number]

## 2014-10-25 NOTE — ED Notes (Signed)
Bed: WA03 Expected date:  Expected time:  Means of arrival:  Comments: EMS chills, malaise

## 2014-10-25 NOTE — ED Notes (Signed)
Pt on insulin pump (Novolog)---- pt's wife reported that pt just injected himself "23 units".

## 2014-10-25 NOTE — ED Notes (Signed)
Brought in by EMS from home with c/o flu-like symptoms.  Pt reported that he has been having chills and general body aches today.  Pt denies cough or respiratory problems.

## 2014-10-26 ENCOUNTER — Emergency Department (HOSPITAL_COMMUNITY): Payer: Medicare Other

## 2014-10-26 DIAGNOSIS — E11621 Type 2 diabetes mellitus with foot ulcer: Secondary | ICD-10-CM | POA: Diagnosis not present

## 2014-10-26 DIAGNOSIS — I2510 Atherosclerotic heart disease of native coronary artery without angina pectoris: Secondary | ICD-10-CM | POA: Diagnosis not present

## 2014-10-26 DIAGNOSIS — F419 Anxiety disorder, unspecified: Secondary | ICD-10-CM | POA: Diagnosis present

## 2014-10-26 DIAGNOSIS — S82832A Other fracture of upper and lower end of left fibula, initial encounter for closed fracture: Secondary | ICD-10-CM | POA: Diagnosis not present

## 2014-10-26 DIAGNOSIS — Y92239 Unspecified place in hospital as the place of occurrence of the external cause: Secondary | ICD-10-CM | POA: Diagnosis not present

## 2014-10-26 DIAGNOSIS — I10 Essential (primary) hypertension: Secondary | ICD-10-CM | POA: Diagnosis not present

## 2014-10-26 DIAGNOSIS — G4733 Obstructive sleep apnea (adult) (pediatric): Secondary | ICD-10-CM

## 2014-10-26 DIAGNOSIS — N183 Chronic kidney disease, stage 3 (moderate): Secondary | ICD-10-CM | POA: Diagnosis present

## 2014-10-26 DIAGNOSIS — Z9641 Presence of insulin pump (external) (internal): Secondary | ICD-10-CM | POA: Diagnosis present

## 2014-10-26 DIAGNOSIS — N179 Acute kidney failure, unspecified: Secondary | ICD-10-CM | POA: Diagnosis not present

## 2014-10-26 DIAGNOSIS — L03115 Cellulitis of right lower limb: Secondary | ICD-10-CM | POA: Diagnosis not present

## 2014-10-26 DIAGNOSIS — Z9989 Dependence on other enabling machines and devices: Secondary | ICD-10-CM

## 2014-10-26 DIAGNOSIS — E669 Obesity, unspecified: Secondary | ICD-10-CM | POA: Diagnosis present

## 2014-10-26 DIAGNOSIS — S91109A Unspecified open wound of unspecified toe(s) without damage to nail, initial encounter: Secondary | ICD-10-CM

## 2014-10-26 DIAGNOSIS — L03031 Cellulitis of right toe: Secondary | ICD-10-CM | POA: Diagnosis present

## 2014-10-26 DIAGNOSIS — L0889 Other specified local infections of the skin and subcutaneous tissue: Secondary | ICD-10-CM | POA: Diagnosis not present

## 2014-10-26 DIAGNOSIS — Z7982 Long term (current) use of aspirin: Secondary | ICD-10-CM | POA: Diagnosis not present

## 2014-10-26 DIAGNOSIS — Z89421 Acquired absence of other right toe(s): Secondary | ICD-10-CM | POA: Diagnosis not present

## 2014-10-26 DIAGNOSIS — Z87891 Personal history of nicotine dependence: Secondary | ICD-10-CM | POA: Diagnosis not present

## 2014-10-26 DIAGNOSIS — E118 Type 2 diabetes mellitus with unspecified complications: Secondary | ICD-10-CM | POA: Diagnosis not present

## 2014-10-26 DIAGNOSIS — E1165 Type 2 diabetes mellitus with hyperglycemia: Secondary | ICD-10-CM | POA: Diagnosis present

## 2014-10-26 DIAGNOSIS — W19XXXA Unspecified fall, initial encounter: Secondary | ICD-10-CM | POA: Diagnosis not present

## 2014-10-26 DIAGNOSIS — L539 Erythematous condition, unspecified: Secondary | ICD-10-CM | POA: Diagnosis not present

## 2014-10-26 DIAGNOSIS — S82402A Unspecified fracture of shaft of left fibula, initial encounter for closed fracture: Secondary | ICD-10-CM | POA: Diagnosis not present

## 2014-10-26 DIAGNOSIS — M19071 Primary osteoarthritis, right ankle and foot: Secondary | ICD-10-CM | POA: Diagnosis not present

## 2014-10-26 DIAGNOSIS — S82892A Other fracture of left lower leg, initial encounter for closed fracture: Secondary | ICD-10-CM | POA: Diagnosis not present

## 2014-10-26 DIAGNOSIS — E1140 Type 2 diabetes mellitus with diabetic neuropathy, unspecified: Secondary | ICD-10-CM | POA: Diagnosis present

## 2014-10-26 DIAGNOSIS — Z6835 Body mass index (BMI) 35.0-35.9, adult: Secondary | ICD-10-CM | POA: Diagnosis not present

## 2014-10-26 DIAGNOSIS — M25462 Effusion, left knee: Secondary | ICD-10-CM | POA: Diagnosis not present

## 2014-10-26 DIAGNOSIS — I129 Hypertensive chronic kidney disease with stage 1 through stage 4 chronic kidney disease, or unspecified chronic kidney disease: Secondary | ICD-10-CM | POA: Diagnosis present

## 2014-10-26 DIAGNOSIS — Z794 Long term (current) use of insulin: Secondary | ICD-10-CM | POA: Diagnosis not present

## 2014-10-26 DIAGNOSIS — L97519 Non-pressure chronic ulcer of other part of right foot with unspecified severity: Secondary | ICD-10-CM | POA: Diagnosis present

## 2014-10-26 LAB — CBC
HCT: 35.4 % — ABNORMAL LOW (ref 39.0–52.0)
Hemoglobin: 11.5 g/dL — ABNORMAL LOW (ref 13.0–17.0)
MCH: 30.1 pg (ref 26.0–34.0)
MCHC: 32.5 g/dL (ref 30.0–36.0)
MCV: 92.7 fL (ref 78.0–100.0)
Platelets: 131 10*3/uL — ABNORMAL LOW (ref 150–400)
RBC: 3.82 MIL/uL — ABNORMAL LOW (ref 4.22–5.81)
RDW: 14.3 % (ref 11.5–15.5)
WBC: 9.1 10*3/uL (ref 4.0–10.5)

## 2014-10-26 LAB — GLUCOSE, CAPILLARY
Glucose-Capillary: 108 mg/dL — ABNORMAL HIGH (ref 65–99)
Glucose-Capillary: 63 mg/dL — ABNORMAL LOW (ref 65–99)
Glucose-Capillary: 135 mg/dL — ABNORMAL HIGH (ref 65–99)
Glucose-Capillary: 149 mg/dL — ABNORMAL HIGH (ref 65–99)
Glucose-Capillary: 137 mg/dL — ABNORMAL HIGH (ref 65–99)

## 2014-10-26 LAB — URINE CULTURE
Colony Count: NO GROWTH
Culture: NO GROWTH

## 2014-10-26 LAB — CBG MONITORING, ED: Glucose-Capillary: 116 mg/dL — ABNORMAL HIGH (ref 65–99)

## 2014-10-26 MED ORDER — ENOXAPARIN SODIUM 40 MG/0.4ML ~~LOC~~ SOLN
40.0000 mg | SUBCUTANEOUS | Status: DC
  Administered 2014-10-26 – 2014-10-29 (×4): 40 mg via SUBCUTANEOUS
  Filled 2014-10-26 (×4): qty 0.4

## 2014-10-26 MED ORDER — SODIUM CHLORIDE 0.9 % IV SOLN
INTRAVENOUS | Status: DC
  Administered 2014-10-26 – 2014-10-28 (×3): via INTRAVENOUS

## 2014-10-26 MED ORDER — ACETAMINOPHEN 650 MG RE SUPP: 650.0000 mg | Freq: Four times a day (QID) | RECTAL | Status: DC | PRN

## 2014-10-26 MED ORDER — INSULIN ASPART 100 UNIT/ML ~~LOC~~ SOLN: 120.0000 [IU] | Freq: Every day | SUBCUTANEOUS | Status: DC

## 2014-10-26 MED ORDER — AMLODIPINE BESYLATE 5 MG PO TABS
5.0000 mg | ORAL_TABLET | Freq: Every day | ORAL | Status: DC
  Administered 2014-10-26 – 2014-10-29 (×2): 5 mg via ORAL
  Filled 2014-10-26 (×4): qty 1

## 2014-10-26 MED ORDER — LIRAGLUTIDE 18 MG/3ML ~~LOC~~ SOPN: 1.8000 mg | PEN_INJECTOR | Freq: Every day | SUBCUTANEOUS | Status: DC

## 2014-10-26 MED ORDER — PIPERACILLIN-TAZOBACTAM 3.375 G IVPB
3.3750 g | Freq: Once | INTRAVENOUS | Status: AC
  Administered 2014-10-26: 3.375 g via INTRAVENOUS
  Filled 2014-10-26: qty 50

## 2014-10-26 MED ORDER — PIPERACILLIN-TAZOBACTAM 3.375 G IVPB
3.3750 g | Freq: Three times a day (TID) | INTRAVENOUS | Status: DC
  Administered 2014-10-26 – 2014-10-29 (×10): 3.375 g via INTRAVENOUS
  Filled 2014-10-26 (×10): qty 50

## 2014-10-26 MED ORDER — ACETAMINOPHEN 325 MG PO TABS
650.0000 mg | ORAL_TABLET | Freq: Four times a day (QID) | ORAL | Status: DC | PRN
  Administered 2014-10-27: 650 mg via ORAL
  Filled 2014-10-26: qty 2

## 2014-10-26 MED ORDER — CARVEDILOL 25 MG PO TABS
25.0000 mg | ORAL_TABLET | Freq: Two times a day (BID) | ORAL | Status: DC
  Administered 2014-10-26 – 2014-10-29 (×6): 25 mg via ORAL
  Filled 2014-10-26 (×10): qty 1

## 2014-10-26 MED ORDER — VANCOMYCIN HCL 10 G IV SOLR
1250.0000 mg | INTRAVENOUS | Status: DC
  Administered 2014-10-26: 1250 mg via INTRAVENOUS
  Filled 2014-10-26 (×2): qty 1250

## 2014-10-26 MED ORDER — ISOSORBIDE MONONITRATE ER 60 MG PO TB24
60.0000 mg | ORAL_TABLET | Freq: Every day | ORAL | Status: DC
  Administered 2014-10-26 – 2014-10-29 (×4): 60 mg via ORAL
  Filled 2014-10-26 (×4): qty 1

## 2014-10-26 MED ORDER — ASPIRIN EC 81 MG PO TBEC
81.0000 mg | DELAYED_RELEASE_TABLET | Freq: Every day | ORAL | Status: DC
  Administered 2014-10-26 – 2014-10-29 (×4): 81 mg via ORAL
  Filled 2014-10-26 (×4): qty 1

## 2014-10-26 MED ORDER — TORSEMIDE 20 MG PO TABS
40.0000 mg | ORAL_TABLET | Freq: Every day | ORAL | Status: DC
  Administered 2014-10-26: 40 mg via ORAL
  Filled 2014-10-26: qty 2

## 2014-10-26 MED ORDER — INSULIN PUMP
Freq: Three times a day (TID) | SUBCUTANEOUS | Status: DC
  Administered 2014-10-26 – 2014-10-29 (×10): via SUBCUTANEOUS
  Filled 2014-10-26: qty 1

## 2014-10-26 MED ORDER — ONDANSETRON HCL 4 MG PO TABS: 4.0000 mg | ORAL_TABLET | Freq: Four times a day (QID) | ORAL | Status: DC | PRN

## 2014-10-26 MED ORDER — ONDANSETRON HCL 4 MG/2ML IJ SOLN: 4.0000 mg | Freq: Four times a day (QID) | INTRAMUSCULAR | Status: DC | PRN

## 2014-10-26 MED ORDER — ACETAMINOPHEN 500 MG PO TABS: 1000.0000 mg | ORAL_TABLET | Freq: Four times a day (QID) | ORAL | Status: DC | PRN

## 2014-10-26 MED ORDER — CALCITRIOL 0.25 MCG PO CAPS
0.2500 ug | ORAL_CAPSULE | Freq: Every day | ORAL | Status: DC
  Administered 2014-10-26 – 2014-10-29 (×4): 0.25 ug via ORAL
  Filled 2014-10-26 (×4): qty 1

## 2014-10-26 MED ORDER — LISINOPRIL 10 MG PO TABS
10.0000 mg | ORAL_TABLET | Freq: Every day | ORAL | Status: DC
  Administered 2014-10-26: 10 mg via ORAL
  Filled 2014-10-26 (×2): qty 1

## 2014-10-26 MED ORDER — PANTOPRAZOLE SODIUM 40 MG PO TBEC
40.0000 mg | DELAYED_RELEASE_TABLET | Freq: Every day | ORAL | Status: DC
  Administered 2014-10-26 – 2014-10-29 (×4): 40 mg via ORAL
  Filled 2014-10-26 (×4): qty 1

## 2014-10-26 MED ORDER — ALUM & MAG HYDROXIDE-SIMETH 200-200-20 MG/5ML PO SUSP: 30.0000 mL | Freq: Four times a day (QID) | ORAL | Status: DC | PRN

## 2014-10-26 MED ORDER — DOXAZOSIN MESYLATE 4 MG PO TABS
4.0000 mg | ORAL_TABLET | Freq: Every day | ORAL | Status: DC
  Administered 2014-10-26 – 2014-10-29 (×3): 4 mg via ORAL
  Filled 2014-10-26 (×4): qty 1

## 2014-10-26 NOTE — Progress Notes (Signed)
Patient arrived on the unit at approximately 0205 accompanied by his spouse. Patient is alert and verbally responsive. Has redness to right foot. No complaints voiced at this time.

## 2014-10-26 NOTE — Progress Notes (Signed)
Hypoglycemic Event  CBG: 63  Treatment: 15 GM carbohydrate snack  Symptoms: None  Follow-up CBG: Time:1233 CBG Result:135  Possible Reasons for Event: Unknown  Comments/MD notified:    Joaquin Courts  Remember to initiate Hypoglycemia Order Set & complete

## 2014-10-26 NOTE — Progress Notes (Signed)
Pt brought in his cpap from home with nasal pillows and prefers to where it while here in the hospital.  No frays on cord or obvious defects noted.  Machine turns on and appears to be working properly.  Sterile water added to humidity chamber.  Pt stated he has wore it for years and feels comfortable placing it on himself later when ready for bed.

## 2014-10-26 NOTE — Progress Notes (Signed)
10/26/14/1150  Pt BS 63. Patient requested orange juice. Pt was given 8oz of orange juice.

## 2014-10-26 NOTE — Progress Notes (Signed)
Patient Demographics  David [name], is a 74 y.o. male, DOB - November 01, 1940, [id number]  Admit date - 10/25/2014   Admitting Physician Truett Mainland, DO  Outpatient Primary MD for the patient is [name], [place]  LOS - 0   Chief Complaint  Patient presents with  . Fever       Admission HPI/Brief narrative: David [name] is a 74 y.o. male With diabetes on an insulin pump, hypertension, coronary artery disease, active sleep apnea, obesity, stage III chronic kidney disease. Patient has been dealing with a diabetic wound ulcer on the plantar surface of his right great toe over the past 3 months. He's been seeing a podiatrist who has been managing this. Resents with fever , eating treated for infected right great toe , and right lower extremity cellulitis .  Subjective:   David [name] today has, No headache, No chest pain, No abdominal pain - No Nausea, No new weakness tingling or numbness, No Cough - SOB.   Assessment & Plan    Active Problems:   DM (diabetes mellitus), type 2 with complications   Essential hypertension   Renal insufficiency   Cardiovascular disease   Open toe wound   Cellulitis of great toe of right foot  Right great toe infection /right lower extremity cellulitis : - Continue with broad-spectrum IV antibiotics including vancomycin and Zosyn pending blood cultures and wound cultures . - won't be able to obtain MRI given pacemaker to rule out cluster myelitis, no IV contrast given his CKD , will obtain three-phase bone scan to rule out osteomyelitis   Chronic kidney disease  - Appears to be at baseline, continue to monitor , will hold torsemide that he is more stable, patient is on lisinopril continue with it as long his creatinine seems to be at baseline.  Diabetes mellitus - Patient continue to use his insulin pump,on liraglutide, CBGs are acceptable, follow on  hemoglobin A1c.  Hypertension - Acceptable, continue with home medication  History of coronary artery disease - Denies any chest pain or shortness of breath, continue with aspirin, lisinopril, beta blockers, Imdur.  Obstructive sleep apnea - Continue with C Pap    Code Status: Full  Family Communication: Wife and son at bedside.  Disposition Plan: Pending further workup   Procedures  None   Consults   None   Medications  Scheduled Meds: . amLODipine  5 mg Oral Daily  . aspirin EC  81 mg Oral Daily  . calcitRIOL  0.25 mcg Oral Daily  . carvedilol  25 mg Oral BID WC  . doxazosin  4 mg Oral Daily  . enoxaparin (LOVENOX) injection  40 mg Subcutaneous Q24H  . insulin pump   Subcutaneous TID AC, HS, 0200  . isosorbide mononitrate  60 mg Oral Daily  . Liraglutide  1.8 mg Subcutaneous Daily  . lisinopril  10 mg Oral Daily  . pantoprazole  40 mg Oral Daily  . piperacillin-tazobactam (ZOSYN)  IV  3.375 g Intravenous 3 times per day  . torsemide  40 mg Oral Daily  . vancomycin  1,250 mg Intravenous Q24H   Continuous Infusions: . sodium chloride 75 mL/hr at 10/26/14 0230   PRN Meds:.acetaminophen **OR** acetaminophen, alum & mag hydroxide-simeth, ondansetron **OR** ondansetron (  ZOFRAN) IV  DVT Prophylaxis  Lovenox -  Lab Results  Component Value Date   PLT 131* 10/26/2014    Antibiotics   Anti-infectives    Start     Dose/Rate Route Frequency Ordered Stop   10/26/14 2200  vancomycin (VANCOCIN) 1,250 mg in sodium chloride 0.9 % 250 mL IVPB     1,250 mg 166.7 mL/hr over 90 Minutes Intravenous Every 24 hours 10/26/14 0505     10/26/14 0600  piperacillin-tazobactam (ZOSYN) IVPB 3.375 g     3.375 g 12.5 mL/hr over 240 Minutes Intravenous 3 times per day 10/26/14 0505     10/26/14 0130  piperacillin-tazobactam (ZOSYN) IVPB 3.375 g     3.375 g 12.5 mL/hr over 240 Minutes Intravenous  Once 10/26/14 0124 10/26/14 0535   10/25/14 2245  vancomycin (VANCOCIN) 1,750 mg  in sodium chloride 0.9 % 500 mL IVPB     1,750 mg 250 mL/hr over 120 Minutes Intravenous  Once 10/25/14 2235 10/26/14 0133          Objective:   Filed Vitals:   10/26/14 0133 10/26/14 0146 10/26/14 0225 10/26/14 0620  BP:  134/66 157/48 144/41  Pulse:  57 61 61  Temp: 99.6 F (37.6 C)  100.6 F (38.1 C) 98.5 F (36.9 C)  TempSrc: Oral  Oral Oral  Resp:  18 20 20   Height:    6\' 4"  (1.93 m)  Weight:    129 kg (284 lb 6.3 oz)  SpO2:  95% 96% 96%    Wt Readings from Last 3 Encounters:  10/26/14 129 kg (284 lb 6.3 oz)  10/16/14 122.925 kg (271 lb)  09/16/14 125.102 kg (275 lb 12.8 oz)     Intake/Output Summary (Last 24 hours) at 10/26/14 1037 Last data filed at 10/26/14 [id number]  Gross per 24 hour  Intake 1461.5 ml  Output    250 ml  Net 1211.5 ml     Physical Exam  Awake Alert, Oriented X 3, No new F.N deficits, Normal affect [place].AT,PERRAL Supple Neck,No JVD, No cervical lymphadenopathy appriciated.  Symmetrical Chest wall movement, Good air movement bilaterally, CTAB RRR,No Gallops,Rubs or new Murmurs, No Parasternal Heave +ve B.Sounds, Abd Soft, No tenderness, No organomegaly appriciated, No rebound - guarding or rigidity. Diminished pulses bilaterally, but no evidence of ischemia or cyanosis, right foot and lower extremities colitis, right great toe with 5 mm diabetic wound ulcer on the plantar surface, David [name] has no feeling in that area, second and third toe amputated.   Data Review   Micro Results No results found for this or any previous visit (from the past 240 hour(s)).  Radiology Reports Dg Foot Complete Right  10/26/2014   CLINICAL DATA:  Uncontrolled diabetes, hypertension. Warmth and erythema to the great toe, new today.  EXAM: RIGHT FOOT COMPLETE - 3+ VIEW  COMPARISON:  None.  FINDINGS: There is prior amputation of the second digit at the MTP joint. There is amputation of the third distal phalanx. There is severe joint space narrowing and osteophyte  formation about the first MTP joint with prominent sclerosis. No bony destruction is evident.  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 No acute findings are evident. Prior amputations as detailed above.   Electronically Signed   By: Andreas Newport [name]   On: 10/26/2014 02:01     CBC  Recent Labs Lab 10/25/14 2247 10/26/14 0436  WBC 10.8* 9.1  HGB 12.5* 11.5*  HCT 38.3* 35.4*  PLT 143* 131*  MCV  92.7 92.7  MCH 30.3 30.1  MCHC 32.6 32.5  RDW 14.1 14.3  LYMPHSABS 0.6*  --   MONOABS 1.1*  --   EOSABS 0.1  --   BASOSABS 0.0  --     Chemistries   Recent Labs Lab 10/25/14 2247  NA 137  K 5.1  CL 105  CO2 23  GLUCOSE 133*  BUN 39*  CREATININE 1.86*  CALCIUM 9.2   ------------------------------------------------------------------------------------------------------------------ estimated creatinine clearance is 51.1 mL/min (by C-G formula based on Cr of 1.86). ------------------------------------------------------------------------------------------------------------------ No results for input(s): HGBA1C in the last 72 hours. ------------------------------------------------------------------------------------------------------------------ No results for input(s): CHOL, HDL, LDLCALC, TRIG, CHOLHDL, LDLDIRECT in the last 72 hours. ------------------------------------------------------------------------------------------------------------------ No results for input(s): TSH, T4TOTAL, T3FREE, THYROIDAB in the last 72 hours.  Invalid input(s): FREET3 ------------------------------------------------------------------------------------------------------------------ No results for input(s): VITAMINB12, FOLATE, FERRITIN, TIBC, IRON, RETICCTPCT in the last 72 hours.  Coagulation profile No results for input(s): INR, PROTIME in the last 168 hours.  No results for input(s): DDIMER in the last 72 hours.  Cardiac Enzymes No results for input(s): CKMB,  TROPONINI, MYOGLOBIN in the last 168 hours.  Invalid input(s): CK ------------------------------------------------------------------------------------------------------------------ Invalid input(s): POCBNP     Time Spent in minutes   30 minutes   Patra Gherardi M.D on 10/26/2014 at 10:37 AM  Between 7am to 7pm - Pager - [phone]  After 7pm go to [email or web address] - password [facility]  [facility]   Office  [phone]

## 2014-10-26 NOTE — H&P (Signed)
History and Physical  David [name] [id number] DOB: 08-18-1940 DOA: 10/25/2014  Referring physician: Dr Reather Converse, ED physician PCP: [name], FNP   Chief Complaint: Fever  HPI: David [name] is a 74 y.o. male  With diabetes on an insulin pump, hypertension, coronary artery disease, active sleep apnea, obesity, stage III chronic kidney disease. Patient has been dealing with a diabetic wound ulcer on the plantar surface of his right great toe over the past 3 months. He's been seeing a podiatrist who has been managing this. Over the past 48 hours, this toes become red and warm to touch. Additionally, he woke this morning with a fever and generalized malaise. His appetite has been decreased. No provoking or palliating factors.  He denies any recent antibiotic use. No hospitalizations in the past 90 days.  Review of Systems:   Pt complains of headache, decreased appetite, fevers, chills.  Pt denies any chest pain, shortness of breath, palpitations, nausea, vomiting, abdominal pain, diarrhea, constipation, cough, shortness of breath, sputum production. Review of systems are otherwise negative  Past Medical History  Diagnosis Date  . Diabetes   . Hypertension   . CAD (coronary artery disease)   . Renal insufficiency   . Sleep apnea   . Palpitations   . Dyspnea   . Sinus bradycardia   . Obesity   . Allergy   . Kidney failure   . Arthritis   . Diabetes   . Anxiety   . Cancer     skin   Past Surgical History  Procedure Laterality Date  . Insert / replace / remove pacemaker    . Knee surgery    . Vein ligation and stripping    . 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 He has never used smokeless tobacco. He reports that he drinks about 1.2 oz of alcohol per week. He reports that he does not use illicit drugs. Patient lives at home & is able to participate in activities of daily living  No Known  Allergies  Family History  Problem Relation Age of Onset  . Cancer Mother     breast  . Heart attack Father       Prior to Admission medications   Medication Sig Start Date End Date Taking? Authorizing Provider  acetaminophen (TYLENOL) 500 MG tablet Take 1,000 mg by mouth every 6 (six) hours as needed for fever.   Yes Historical Provider, MD  amLODipine (NORVASC) 5 MG tablet Take 1 tablet (5 mg total) by mouth daily. 01/22/14  Yes [name], MD  aspirin 81 MG tablet Take 81 mg by mouth daily.   Yes Historical Provider, MD  calcitRIOL (ROCALTROL) 0.25 MCG capsule Take 0.25 mcg by mouth daily.  05/30/14  Yes Historical Provider, MD  carvedilol (COREG) 25 MG tablet Take 1 tablet (25 mg total) by mouth 2 (two) times daily with a meal. 08/04/14  Yes [name], FNP  doxazosin (CARDURA) 4 MG tablet Take 1 tablet (4 mg total) by mouth daily. 09/16/14  Yes Elayne Snare, MD  insulin aspart (NOVOLOG) 100 UNIT/ML injection Use max 120 units per day with insulin pump. 10/15/14  Yes Elayne Snare, MD  isosorbide mononitrate (IMDUR) 30 MG 24 hr tablet TAKE TWO TABLETS BY MOUTH ONCE DAILY 12/24/13  Yes [name], MD  Liraglutide (VICTOZA) 18 MG/3ML SOPN Inject 1.8 mg into the skin daily. Inject once daily at  the same time 04/28/14  Yes Elayne Snare, MD  lisinopril (PRINIVIL,ZESTRIL) 10 MG tablet Take 1 tablet (10 mg total) by mouth daily. 08/04/14  Yes [name], FNP  mupirocin cream (BACTROBAN) 2 % Apply 1 application topically 2 (two) times daily. Applied to the toe 04/14/14  Yes Historical Provider, MD  Omega-3 Fatty Acids (FISH OIL) 1000 MG CAPS Take by mouth 2 (two) times daily.   Yes Historical Provider, MD  pantoprazole (PROTONIX) 40 MG tablet Take 1 tablet (40 mg total) by mouth daily. 08/04/14  Yes [name], FNP  torsemide (DEMADEX) 20 MG tablet TAKE TWO TABLETS BY MOUTH ONCE DAILY 02/03/14  Yes [name], MD  B-D ULTRAFINE III SHORT PEN 31G X 8 MM MISC  07/29/13   Historical  Provider, MD  ciprofloxacin-dexamethasone (CIPRODEX) otic suspension Place 4 drops into the left ear 2 (two) times daily. Patient not taking: Reported on 10/25/2014 08/04/14   [name], FNP  glucose blood (BAYER CONTOUR NEXT TEST) test strip Use as instructed to check blood sugar 6 times per day dx code E11.69, patient is on insulin pump and is medically required to check 6 times per day. 08/28/14   Elayne Snare, MD  neomycin-polymyxin-hydrocortisone (CORTISPORIN) otic solution Place 3 drops into the left ear 4 (four) times daily. Patient not taking: Reported on 10/25/2014 08/05/14   [name], FNP    Physical Exam: BP 150/41 mmHg  Pulse 60  Temp(Src) 102 F (38.9 C) (Rectal)  Resp 18  Ht 6' (1.829 m)  Wt 122.471 kg (270 lb)  BMI 36.61 kg/m2  SpO2 94%  General:  elderly Caucasian male. Awake and alert and oriented x3. No acute cardiopulmonary distress.  Eyes: Pupils equal, round, reactive to light. Extraocular muscles are intact. Sclerae anicteric and noninjected.  ENT: Moist mucosal membranes. No mucosal lesions.   Neck: Neck supple without lymphadenopathy. No carotid bruits. No masses palpated.  Cardiovascular: Regular rate with normal S1-S2 sounds. No murmurs, rubs, gallops auscultated. No JVD.  Respiratory: Good respiratory effort with no wheezes, rales, rhonchi. Lungs clear to auscultation bilaterally.  Abdomen: Obese. Soft, nontender, nondistended. Active bowel sounds. No masses or hepatosplenomegaly  Skin: Dry, warm to touch. 2+ dorsalis pedis and radial pulses.  There is a 5 mm diabetic wound ulcer on the plantar surface of his right great toe. It appears to be 5-7 mm in depth. There is erythema that extends on the medial aspect of his great toe that wraps around to the front and to the first metatarsal phalangeal joint. That area is warm to the touch. There is no tenderness as the patient has diabetic neuropathy. Additionally, he has complete irritation of his second digit  of his right foot and the distal phalanges of his third digit of his right foot.  Musculoskeletal: No calf or leg pain. All major joints not erythematous nontender.  Psychiatric: Intact judgment and insight.  Neurologic: No focal neurological deficits. Cranial nerves II through XII are grossly intact.           Labs on Admission:  Basic Metabolic Panel:  Recent Labs Lab 10/25/14 2247  NA 137  K 5.1  CL 105  CO2 23  GLUCOSE 133*  BUN 39*  CREATININE 1.86*  CALCIUM 9.2   Liver Function Tests: No results for input(s): AST, ALT, ALKPHOS, BILITOT, PROT, ALBUMIN in the last 168 hours. No results for input(s): LIPASE, AMYLASE in the last 168 hours. No results for input(s): AMMONIA in the  last 168 hours. CBC:  Recent Labs Lab 10/25/14 2247  WBC 10.8*  NEUTROABS 9.0*  HGB 12.5*  HCT 38.3*  MCV 92.7  PLT 143*   Cardiac Enzymes: No results for input(s): CKTOTAL, CKMB, CKMBINDEX, TROPONINI in the last 168 hours.  BNP (last 3 results) No results for input(s): BNP in the last 8760 hours.  ProBNP (last 3 results) No results for input(s): PROBNP in the last 8760 hours.  CBG:  Recent Labs Lab 10/25/14 2245 10/25/14 2353  GLUCAP 139* 124*    Radiological Exams on Admission: No results found.   Assessment/Plan Present on Admission:  . Open toe wound . Cellulitis of great toe of right foot  This patient was discussed with the ED physician, including pertinent vitals, physical exam findings, labs, and imaging.  We also discussed care given by the ED provider.  #1 cellulitis the great toe of right foot #2 open diabetic wound ulcer #3 diabetes #4 hypertension  As the patient has systemic pressures of his illness, he qualifies for moderate to severe wound infection/cellulitis.  Will admit to MedSurg.  Due to the chronic wound ulcer, will treat him with Zosyn and vancomycin to cover MRSA, gram-negative rods, and aerobic bacteria. Wound culture obtained CBC in the  morning CBGs before meals and at bedtime  X-ray of his right foot is still pending.    DVT prophylaxis:  Lovenox  Consultants:  none  Code Status:  full code  Family Communication:  wife in the room   Disposition Plan:  home following improvement   Truett Mainland, DO [facility] Pager [phone]

## 2014-10-26 NOTE — Progress Notes (Signed)
ANTIBIOTIC CONSULT NOTE - INITIAL  Pharmacy Consult for Vancomycin and Zosyn  Indication: Open toe wound, diabetic cellulitis  No Known Allergies  Patient Measurements: Height: 6' (182.9 cm) Weight: 270 lb (122.471 kg) IBW/kg (Calculated) : 77.6 Adjusted Body Weight:   Vital Signs: Temp: 100.6 F (38.1 C) (06/12 0225) Temp Source: Oral (06/12 0225) BP: 157/48 mmHg (06/12 0225) Pulse Rate: 61 (06/12 0225) Intake/Output from previous day: 06/11 0701 - 06/12 0700 In: 1461.5 [I.V.:1461.5] Out: 250 [Urine:250] Intake/Output from this shift: Total I/O In: 1461.5 [I.V.:1461.5] Out: 250 [Urine:250]  Labs:  Recent Labs  10/25/14 2247 10/26/14 0436  WBC 10.8* 9.1  HGB 12.5* 11.5*  PLT 143* 131*  CREATININE 1.86*  --    Estimated Creatinine Clearance: 47.1 mL/min (by C-G formula based on Cr of 1.86). No results for input(s): VANCOTROUGH, VANCOPEAK, VANCORANDOM, GENTTROUGH, GENTPEAK, GENTRANDOM, TOBRATROUGH, TOBRAPEAK, TOBRARND, AMIKACINPEAK, AMIKACINTROU, AMIKACIN in the last 72 hours.   Microbiology: No results found for this or any previous visit (from the past 720 hour(s)).  Medical History: Past Medical History  Diagnosis Date  . Diabetes   . Hypertension   . CAD (coronary artery disease)   . Renal insufficiency   . Sleep apnea   . Palpitations   . Dyspnea   . Sinus bradycardia   . Obesity   . Allergy   . Kidney failure   . Arthritis   . Diabetes   . Anxiety   . Cancer     skin    Medications:  Anti-infectives    Start     Dose/Rate Route Frequency Ordered Stop   10/26/14 2200  vancomycin (VANCOCIN) 1,250 mg in sodium chloride 0.9 % 250 mL IVPB     1,250 mg 166.7 mL/hr over 90 Minutes Intravenous Every 24 hours 10/26/14 0505     10/26/14 0600  piperacillin-tazobactam (ZOSYN) IVPB 3.375 g     3.375 g 12.5 mL/hr over 240 Minutes Intravenous 3 times per day 10/26/14 0505     10/26/14 0130  piperacillin-tazobactam (ZOSYN) IVPB 3.375 g     3.375 g 12.5  mL/hr over 240 Minutes Intravenous  Once 10/26/14 0124 10/26/14 0535   10/25/14 2245  vancomycin (VANCOCIN) 1,750 mg in sodium chloride 0.9 % 500 mL IVPB     1,750 mg 250 mL/hr over 120 Minutes Intravenous  Once 10/25/14 2235 10/26/14 0133     Assessment: Patient with diabetic foot cellulitis.  First dose of antibiotics already given in ED.    Goal of Therapy:  Vancomycin trough level 15-20 mcg/ml  Zosyn dosed based on patient weight and renal function   Plan:  Measure antibiotic drug levels at steady state Follow up culture results vancomycin 1250mg  iv q24hr  Zosyn 3.375g IV Q8H infused over 4hrs.   Tyler Deis, Shea Stakes [place] 10/26/2014,5:36 AM

## 2014-10-27 ENCOUNTER — Inpatient Hospital Stay (HOSPITAL_COMMUNITY): Payer: Medicare Other

## 2014-10-27 ENCOUNTER — Encounter (HOSPITAL_COMMUNITY): Payer: Medicare Other

## 2014-10-27 DIAGNOSIS — N289 Disorder of kidney and ureter, unspecified: Secondary | ICD-10-CM

## 2014-10-27 DIAGNOSIS — G4733 Obstructive sleep apnea (adult) (pediatric): Secondary | ICD-10-CM

## 2014-10-27 LAB — BASIC METABOLIC PANEL
Anion gap: 9 (ref 5–15)
BUN: 45 mg/dL — ABNORMAL HIGH (ref 6–20)
CO2: 23 mmol/L (ref 22–32)
Calcium: 8.5 mg/dL — ABNORMAL LOW (ref 8.9–10.3)
Chloride: 107 mmol/L (ref 101–111)
Creatinine, Ser: 2.44 mg/dL — ABNORMAL HIGH (ref 0.61–1.24)
GFR calc Af Amer: 28 mL/min — ABNORMAL LOW (ref >60)
GFR calc non Af Amer: 25 mL/min — ABNORMAL LOW (ref >60)
Glucose, Bld: 131 mg/dL — ABNORMAL HIGH (ref 65–99)
Potassium: 4.4 mmol/L (ref 3.5–5.1)
Sodium: 139 mmol/L (ref 135–145)

## 2014-10-27 LAB — GLUCOSE, CAPILLARY
Glucose-Capillary: 95 mg/dL (ref 65–99)
Glucose-Capillary: 153 mg/dL — ABNORMAL HIGH (ref 65–99)
Glucose-Capillary: 174 mg/dL — ABNORMAL HIGH (ref 65–99)
Glucose-Capillary: 177 mg/dL — ABNORMAL HIGH (ref 65–99)

## 2014-10-27 MED ORDER — TECHNETIUM TC 99M MEDRONATE IV KIT
25.0000 | PACK | Freq: Once | INTRAVENOUS | Status: AC | PRN
  Administered 2014-10-27: 25 via INTRAVENOUS

## 2014-10-27 MED ORDER — HYDROCODONE-ACETAMINOPHEN 5-325 MG PO TABS
1.0000 | ORAL_TABLET | ORAL | Status: DC | PRN
  Administered 2014-10-27 – 2014-10-29 (×9): 1 via ORAL
  Filled 2014-10-27 (×9): qty 1

## 2014-10-27 MED FILL — Insulin Aspart Inj 100 Unit/ML: SUBCUTANEOUS | Qty: 10 | Status: AC

## 2014-10-27 NOTE — Progress Notes (Signed)
Inpatient Diabetes Program Recommendations  AACE/ADA: New Consensus Statement on Inpatient Glycemic Control (2013)  Target Ranges:  Prepandial:   less than 140 mg/dL      Peak postprandial:   less than 180 mg/dL (1-2 hours)      Critically ill patients:  140 - 180 mg/dL    Results for David [name], David [name] (MRN [id number]) as of 10/27/2014 10:57  Ref. Range 10/26/2014 01:49 10/26/2014 07:16 10/26/2014 11:45 10/26/2014 12:33 10/26/2014 16:17 10/26/2014 21:30  Glucose-Capillary Latest Ref Range: 65-99 mg/dL 116 (H) 108 (H) 63 (L) 135 (H) 149 (H) 137 (H)     Admit with: Fever/ Toe Wound  History: DM, HTN, CAD, CKD3  Home DM Meds: Insulin Pump (see below for settings)       Victoza 1.8 mg daily  Current DM Orders: Insulin Pump tid ac + HS + 2am            Victoza 1.8 mg daily    **Patient currently A&Ox4 and able to independently manage insulin pump.  Patient's wife brought extra pump supplies to the hospital today and per patient, he plans to change set/site today.  **Patient sees Dr. Elayne Snare with [facility] for insulin pump management.  **See below for Insulin pump settings-  Basal Settings:  12AM- 3.3 units/hr 6AM- 3.6 units/hr 9AM- 3.1 units/hr 3PM- 3.0 units/hr 7PM- 3.4 units/hr  Total basal per 24 hour period= 78.2 units  Carbohydrate Ratio:  12AM- 1 unit for every 5 grams carbohydrates 12PM-1 unit for every 7 grams carbohydrates 6PM-  1 unit for every 5 grams carbohydrates 8:30PM-1 unit for every 8 grams carbohydrates   Correction/Sensitivity Factor:  1 unit for every 10 mg/dl above target CBG  Target CBG: 120-150 mg/dl   Will follow daily Wyn Quaker RN, MSN, CDE Diabetes Coordinator Inpatient Glycemic Control Team Team Pager: [phone] (8a-5p)

## 2014-10-27 NOTE — Progress Notes (Signed)
Patient Demographics  David [name], is a 74 y.o. male, DOB - 1940/11/23, [id number]  Admit date - 10/25/2014   Admitting Physician Truett Mainland, DO  Outpatient Primary MD for the patient is [name], [place]  LOS - 1   Chief Complaint  Patient presents with  . Fever       Admission HPI/Brief narrative: David [name] is a 74 y.o. male With diabetes on an insulin pump, hypertension, coronary artery disease, active sleep apnea, obesity, stage III chronic kidney disease. Patient has been dealing with a diabetic wound ulcer on the plantar surface of his right great toe over the past 3 months. He's been seeing a podiatrist who has been managing this. presents with fever ,  treated for infected right great toe , and right lower extremity cellulitis .Unfortunately patient had a fall during hospital stay t, where he sustained left ankle fracture.  Subjective:   David [name] today has, No headache, No chest pain, No abdominal pain - No Nausea, No new weakness tingling or numbness, No Cough - SOB. Unfortunately patient had fall overnight, where he sustained left ankle fracture.  Assessment & Plan    Active Problems:   DM (diabetes mellitus), type 2 with complications   Essential hypertension   Renal insufficiency   Cardiovascular disease   Open toe wound   Cellulitis of great toe of right foot   OSA on CPAP  Right great toe infection /right lower extremity cellulitis : - Initially on broad-spectrum IV antibiotics including vancomycin and Zosyn , will discontinue IV vancomycin, continue Zosyn as one culture growing abundant Pseudomonas aeruginosa - Wound culture: Pseudomonas aeruginosa  - Blood cultures: No growth to date - won't be able to obtain MRI given pacemaker to rule out cluster myelitis, no IV contrast given his CKD , will obtain three-phase bone scan to rule out osteomyelitis     left ankle fracture status post fall during hospital stay  - Orthopedic input greatly appreciated, nonoperative fracture .  Chronic kidney disease  - Appears to be at baseline, continue to monitor , will hold torsemide that he is more stablewill hold lisinopril given worsening renal function  diabetes mellitus - Patient continue to use his insulin pump,on liraglutide, CBGs are acceptable, follow on hemoglobin A1c.  Hypertension - Acceptable, continue with home medication  History of coronary artery disease - Denies any chest pain or shortness of breath, continue with aspirin, lisinopril, beta blockers, Imdur.  Obstructive sleep apnea - Continue with C Pap    Code Status: Full  Family Communication: Wife and son at bedside.  Disposition Plan: Pending further workup   Procedures  None   Consults   None   Medications  Scheduled Meds: . amLODipine  5 mg Oral Daily  . aspirin EC  81 mg Oral Daily  . calcitRIOL  0.25 mcg Oral Daily  . carvedilol  25 mg Oral BID WC  . doxazosin  4 mg Oral Daily  . enoxaparin (LOVENOX) injection  40 mg Subcutaneous Q24H  . insulin pump   Subcutaneous TID AC, HS, 0200  . isosorbide mononitrate  60 mg Oral Daily  . Liraglutide  1.8 mg Subcutaneous Daily  . pantoprazole  40 mg Oral Daily  . piperacillin-tazobactam (ZOSYN)  IV  3.375 g Intravenous 3 times per day  . vancomycin  1,250 mg Intravenous Q24H   Continuous Infusions: . sodium chloride 75 mL/hr at 10/27/14 0553   PRN Meds:.acetaminophen **OR** acetaminophen, alum & mag hydroxide-simeth, HYDROcodone-acetaminophen, ondansetron **OR** ondansetron (ZOFRAN) IV  DVT Prophylaxis  Lovenox -  Lab Results  Component Value Date   PLT 131* 10/26/2014    Antibiotics   Anti-infectives    Start     Dose/Rate Route Frequency Ordered Stop   10/26/14 1800  vancomycin (VANCOCIN) 1,250 mg in sodium chloride 0.9 % 250 mL IVPB     1,250 mg 166.7 mL/hr over 90 Minutes Intravenous Every 24  hours 10/26/14 0505     10/26/14 0600  piperacillin-tazobactam (ZOSYN) IVPB 3.375 g     3.375 g 12.5 mL/hr over 240 Minutes Intravenous 3 times per day 10/26/14 0505     10/26/14 0130  piperacillin-tazobactam (ZOSYN) IVPB 3.375 g     3.375 g 12.5 mL/hr over 240 Minutes Intravenous  Once 10/26/14 0124 10/26/14 0535   10/25/14 2245  vancomycin (VANCOCIN) 1,750 mg in sodium chloride 0.9 % 500 mL IVPB     1,750 mg 250 mL/hr over 120 Minutes Intravenous  Once 10/25/14 2235 10/26/14 0133          Objective:   Filed Vitals:   10/26/14 2050 10/27/14 0001 10/27/14 0439 10/27/14 0800  BP: 132/44 151/50 152/39 132/48  Pulse: 64 70 66 66  Temp: 97.9 F (36.6 C) 98.1 F (36.7 C) 98 F (36.7 C)   TempSrc: Oral Oral Oral   Resp: 20 18 20    Height:      Weight:   132.5 kg (292 lb 1.8 oz)   SpO2: 98% 96% 96%     Wt Readings from Last 3 Encounters:  10/27/14 132.5 kg (292 lb 1.8 oz)  10/16/14 122.925 kg (271 lb)  09/16/14 125.102 kg (275 lb 12.8 oz)     Intake/Output Summary (Last 24 hours) at 10/27/14 1107 Last data filed at 10/27/14 0555  Gross per 24 hour  Intake 1961.9 ml  Output    800 ml  Net 1161.9 ml     Physical Exam  Awake Alert, Oriented X 3, No new F.N deficits, Normal affect [place].AT,PERRAL Supple Neck,No JVD, No cervical lymphadenopathy appriciated.  Symmetrical Chest wall movement, Good air movement bilaterally, CTAB RRR,No Gallops,Rubs or new Murmurs, No Parasternal Heave +ve B.Sounds, Abd Soft, No tenderness, No organomegaly appriciated, No rebound - guarding or rigidity. Diminished pulses bilaterally, but no evidence of ischemia or cyanosis, right foot and lower extremities colitis, right great toe with 5 mm diabetic wound ulcer on the plantar surface, David [name] has no feeling in that area, second and third toe amputated.Left ankle swelling, small skin laceration on the medial side from fall.   Data Review   Micro Results Recent Results (from the past 240  hour(s))  Urine culture     Status: None   Collection Time: 10/25/14 10:41 PM  Result Value Ref Range Status   Specimen Description URINE, CLEAN CATCH  Final   Special Requests NONE  Final   Colony Count NO GROWTH Performed at [facility]   Final   Culture NO GROWTH Performed at [facility]   Final   Report Status 10/26/2014 FINAL  Final  Blood culture (routine x 2)     Status: None (Preliminary result)   Collection Time: 10/25/14 10:47 PM  Result Value Ref Range Status   Specimen Description BLOOD  RIGHT HAND  Final   Special Requests BOTTLES DRAWN AEROBIC AND ANAEROBIC  Final   Culture   Final           BLOOD CULTURE RECEIVED NO GROWTH TO DATE CULTURE WILL BE HELD FOR 5 DAYS BEFORE ISSUING A FINAL NEGATIVE REPORT Note: Culture results may be compromised due to an inadequate volume of blood received in culture bottles. Performed at [facility]    Report Status PENDING  Incomplete  Blood culture (routine x 2)     Status: None (Preliminary result)   Collection Time: 10/25/14 10:47 PM  Result Value Ref Range Status   Specimen Description BLOOD LEFT HAND  Final   Special Requests BOTTLES DRAWN AEROBIC AND ANAEROBIC  Final   Culture   Final           BLOOD CULTURE RECEIVED NO GROWTH TO DATE CULTURE WILL BE HELD FOR 5 DAYS BEFORE ISSUING A FINAL NEGATIVE REPORT Performed at [facility]    Report Status PENDING  Incomplete  Wound culture     Status: None (Preliminary result)   Collection Time: 10/26/14 12:32 AM  Result Value Ref Range Status   Specimen Description TOE RIGHT  Final   Special Requests Normal  Final   Gram Stain PENDING  Incomplete   Culture   Final    ABUNDANT PSEUDOMONAS AERUGINOSA Performed at [facility]    Report Status PENDING  Incomplete    Radiology Reports Dg Ankle Complete Left  10/27/2014   CLINICAL DATA:  Status post fall; left ankle pain. Initial encounter.  EXAM: LEFT ANKLE COMPLETE - 3+ VIEW   COMPARISON:  None.  FINDINGS: There is a mildly displaced oblique fracture across the distal fibular diaphysis, with mild posterior displacement. The interosseous space appears grossly intact. No definite ankle mortise widening is seen at this time.  No definite additional fractures are seen. A small osseous fragment superior to the posterior talus may reflect remote injury. There is suggestion of an osteochondral defect at the medial aspect of the talar dome.  Soft tissue swelling is noted about the ankle. Diffuse vascular calcifications are seen.  IMPRESSION: 1. Mildly displaced oblique fracture across the distal fibular diaphysis, with mild posterior displacement. 2. Suggestion of osteochondral defect at the medial aspect of the talar dome. 3. Small osseous fragment superior to the posterior talus may reflect remote injury. 4. Diffuse vascular calcifications seen.   Electronically Signed   By: Garald Balding [name]   On: 10/27/2014 00:59   Dg Foot Complete Right  10/26/2014   CLINICAL DATA:  Uncontrolled diabetes, hypertension. Warmth and erythema to the great toe, new today.  EXAM: RIGHT FOOT COMPLETE - 3+ VIEW  COMPARISON:  None.  FINDINGS: There is prior amputation of the second digit at the MTP joint. There is amputation of the third distal phalanx. There is severe joint space narrowing and osteophyte formation about the first MTP joint with prominent sclerosis. No bony destruction is evident.  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 No acute findings are evident. Prior amputations as detailed above.   Electronically Signed   By: Andreas Newport [name]   On: 10/26/2014 02:01     CBC  Recent Labs Lab 10/25/14 2247 10/26/14 0436  WBC 10.8* 9.1  HGB 12.5* 11.5*  HCT 38.3* 35.4*  PLT 143* 131*  MCV 92.7 92.7  MCH 30.3 30.1  MCHC 32.6 32.5  RDW 14.1 14.3  LYMPHSABS 0.6*  --  MONOABS 1.1*  --   EOSABS 0.1  --   BASOSABS 0.0  --     Chemistries   Recent Labs Lab  10/25/14 2247 10/27/14 0410  NA 137 139  K 5.1 4.4  CL 105 107  CO2 23 23  GLUCOSE 133* 131*  BUN 39* 45*  CREATININE 1.86* 2.44*  CALCIUM 9.2 8.5*   ------------------------------------------------------------------------------------------------------------------ estimated creatinine clearance is 39.5 mL/min (by C-G formula based on Cr of 2.44). ------------------------------------------------------------------------------------------------------------------ No results for input(s): HGBA1C in the last 72 hours. ------------------------------------------------------------------------------------------------------------------ No results for input(s): CHOL, HDL, LDLCALC, TRIG, CHOLHDL, LDLDIRECT in the last 72 hours. ------------------------------------------------------------------------------------------------------------------ No results for input(s): TSH, T4TOTAL, T3FREE, THYROIDAB in the last 72 hours.  Invalid input(s): FREET3 ------------------------------------------------------------------------------------------------------------------ No results for input(s): VITAMINB12, FOLATE, FERRITIN, TIBC, IRON, RETICCTPCT in the last 72 hours.  Coagulation profile No results for input(s): INR, PROTIME in the last 168 hours.  No results for input(s): DDIMER in the last 72 hours.  Cardiac Enzymes No results for input(s): CKMB, TROPONINI, MYOGLOBIN in the last 168 hours.  Invalid input(s): CK ------------------------------------------------------------------------------------------------------------------ Invalid input(s): POCBNP     Time Spent in minutes   30 minutes   Izzak Fries M.D on 10/27/2014 at 11:07 AM  Between 7am to 7pm - Pager - [phone]  After 7pm go to [email or web address] - password [email or web address]  [facility]   Office  [phone]

## 2014-10-27 NOTE — Progress Notes (Signed)
Patient self administers CPAP. RT  Encouraged patient to call if he needs any assistance. RT will continue to monitor.

## 2014-10-27 NOTE — Progress Notes (Signed)
At approximately 2330, patient called to the desk and stated he fell on his way back from the bathroom and feared he may have broken his ankle. Mikle Bosworth, CNA immediately went to resident's room and saw him sitting at the side of his bed with his gown off and on the floor, his underpants and bed linen wet. Patient stated he fell on his left ankle and he is having pain and limited movement of same. Physical examination revealed a small skin tear to left ankle and slight swelling. Ice was applied to left ankle to control the swelling. When asked what happened, patient stated he had to use the bathroom urgently and could not find the call light to call for assistance. However, call light was still noted to be tied to upper side rail and lying on the bed. V/s T 98.1, HR 70 R 18 B/P 150/50 O2 SATS 96%  RA.Unable to contact spouse at tel #[phone] at [place]. However, I spoke with Marolyn Haller, daughter-in-law at approximately 0016 and informed her of incident. A left ankle Xray was ordered by NP Rogue Bussing and same revealed a left fibula fracture. I informed Marolyn Haller at approximately 0140 of the Xray result. Patient was seen by NP Rogue Bussing and new orders received were NWB to left ankle, prn pain medication and referral to orthopedic MD. Patient to be seen by Dr Onnie Graham this am. Will continue to monitor left ankle for increased swelling.

## 2014-10-27 NOTE — Progress Notes (Signed)
[facility] progress note. Chief complaint. Left ankle pain status post fall. History of present illness. This 74 year old male in hospital with right great toe infection/right lower extremity cellulitis. He has chronic kidney disease, diabetes, hypertension, coronary artery disease, etc. He was ambulating to the bathroom and sustained a fall. He denies the head strike that did complain of left ankle pain. I requested a x-ray series of the left ankle which indicates a mildly displaced oblique fracture across the distal fibular diaphysis with mild posterior displacement. Physical exam. Vital signs. Temperature 98.1, pulse 70, respiration 18, blood pressure 151/50. O2 sats 96%. General appearance. Obese elderly male who is alert and in no distress. Cardiac. Rate and rhythm regular. Lungs. Breath sounds are clear. Abdomen. Soft and obese with positive bowel sounds. Extremities. There is edema and some bruising evident around the left ankle. Any attempts at passive range of motion is set to painful and I deferred. Impression/plan. Problem #1. Left ankle fracture. I discussed the case with Dr. Onnie Graham of orthopedics. He has kindly consented to see the patient in consult and provide treatment recommendations. He will see the patient later this morning. Patient nonweightbearing until that time. I will add when necessary Norco for pain control. Staff has provided ice to the left ankle site and would continue this until later this morning.

## 2014-10-27 NOTE — Consult Note (Signed)
Reason for Consult:left ankle fracture Referring Physician: hospitalist/Dr. Waldron Labs  HPI: David [name] is an 74 y.o. male who was admitted on 10/26/14 for treatment of non healing ulcer to Right great toe and unfortunately fell while ambulating to the bathroom late in night injuring his left ankle. xrays obtained. 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  He denies previous ankle injuries  Past Medical History  Diagnosis Date  . Diabetes   . Hypertension   . CAD (coronary artery disease)   . Renal insufficiency   . Sleep apnea   . Palpitations   . Dyspnea   . Sinus bradycardia   . Obesity   . Allergy   . Kidney failure   . Arthritis   . Diabetes   . Anxiety   . Cancer     skin    Past Surgical History  Procedure Laterality Date  . Insert / replace / remove pacemaker    . Knee surgery    . Vein ligation and stripping    . 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 Cancer Mother     breast  . Heart attack Father     Social History:  reports that he has quit smoking. He has never used smokeless tobacco. He reports that he drinks about 1.2 oz of alcohol per week. He reports that he does not use illicit drugs.  Allergies: No Known Allergies  Medications: I have reviewed the patient's current medications.  Results for orders placed or performed during the hospital encounter of 10/25/14 (from the past 48 hour(s))  Urine culture     Status: None   Collection Time: 10/25/14 10:41 PM  Result Value Ref Range   Specimen Description URINE, CLEAN CATCH    Special Requests NONE    Colony Count NO GROWTH Performed at [facility]     Culture NO GROWTH Performed at [facility]     Report Status 10/26/2014 FINAL   Urinalysis, Routine w reflex microscopic (not at [facility])      Status: Abnormal   Collection Time: 10/25/14 10:41 PM  Result Value Ref Range   Color, Urine YELLOW YELLOW   APPearance CLEAR CLEAR   Specific Gravity, Urine 1.018 1.005 - 1.030   pH 5.0 5.0 - 8.0   Glucose, UA NEGATIVE NEGATIVE mg/dL   Hgb urine dipstick TRACE (A) NEGATIVE   Bilirubin Urine NEGATIVE NEGATIVE   Ketones, ur NEGATIVE NEGATIVE mg/dL   Protein, ur 30 (A) NEGATIVE mg/dL   Urobilinogen, UA 0.2 0.0 - 1.0 mg/dL   Nitrite NEGATIVE NEGATIVE   Leukocytes, UA NEGATIVE NEGATIVE  Urine microscopic-add on     Status: None   Collection Time: 10/25/14 10:41 PM  Result Value Ref Range   Squamous Epithelial / LPF RARE RARE   WBC, UA 0-2 <3 WBC/hpf   RBC / HPF 0-2 <3 RBC/hpf  CBG monitoring, ED     Status: Abnormal   Collection Time: 10/25/14 10:45 PM  Result Value Ref Range   Glucose-Capillary 139 (H) 65 - 99 mg/dL  Basic metabolic panel     Status: Abnormal   Collection Time: 10/25/14 10:47 PM  Result Value Ref Range   Sodium 137 135 - 145 mmol/L   Potassium 5.1 3.5 - 5.1 mmol/L   Chloride 105  101 - 111 mmol/L   CO2 23 22 - 32 mmol/L   Glucose, Bld 133 (H) 65 - 99 mg/dL   BUN 39 (H) 6 - 20 mg/dL   Creatinine, Ser 1.86 (H) 0.61 - 1.24 mg/dL   Calcium 9.2 8.9 - 10.3 mg/dL   GFR calc non Af Amer 34 (L) >60 mL/min   GFR calc Af Amer 39 (L) >60 mL/min    Comment: (NOTE) The eGFR has been calculated using the CKD EPI equation. This calculation has not been validated in all clinical situations. eGFR's persistently <60 mL/min signify possible Chronic Kidney Disease.    Anion gap 9 5 - 15  CBC with Differential/Platelet     Status: Abnormal   Collection Time: 10/25/14 10:47 PM  Result Value Ref Range   WBC 10.8 (H) 4.0 - 10.5 K/uL   RBC 4.13 (L) 4.22 - 5.81 MIL/uL   Hemoglobin 12.5 (L) 13.0 - 17.0 g/dL   HCT 38.3 (L) 39.0 - 52.0 %   MCV 92.7 78.0 - 100.0 fL   MCH 30.3 26.0 - 34.0 pg   MCHC 32.6 30.0 - 36.0 g/dL   RDW 14.1 11.5 - 15.5 %   Platelets 143 (L) 150 - 400  K/uL   Neutrophils Relative % 84 (H) 43 - 77 %   Neutro Abs 9.0 (H) 1.7 - 7.7 K/uL   Lymphocytes Relative 5 (L) 12 - 46 %   Lymphs Abs 0.6 (L) 0.7 - 4.0 K/uL   Monocytes Relative 10 3 - 12 %   Monocytes Absolute 1.1 (H) 0.1 - 1.0 K/uL   Eosinophils Relative 1 0 - 5 %   Eosinophils Absolute 0.1 0.0 - 0.7 K/uL   Basophils Relative 0 0 - 1 %   Basophils Absolute 0.0 0.0 - 0.1 K/uL  I-Stat CG4 Lactic Acid, ED     Status: None   Collection Time: 10/25/14 10:58 PM  Result Value Ref Range   Lactic Acid, Venous 1.33 0.5 - 2.0 mmol/L  POC CBG, ED     Status: Abnormal   Collection Time: 10/25/14 11:53 PM  Result Value Ref Range   Glucose-Capillary 124 (H) 65 - 99 mg/dL  Wound culture     Status: None (Preliminary result)   Collection Time: 10/26/14 12:32 AM  Result Value Ref Range   Specimen Description TOE RIGHT    Special Requests Normal    Gram Stain PENDING    Culture      ABUNDANT PSEUDOMONAS AERUGINOSA Performed at [facility]    Report Status PENDING   CBG monitoring, ED     Status: Abnormal   Collection Time: 10/26/14  1:49 AM  Result Value Ref Range   Glucose-Capillary 116 (H) 65 - 99 mg/dL  CBC     Status: Abnormal   Collection Time: 10/26/14  4:36 AM  Result Value Ref Range   WBC 9.1 4.0 - 10.5 K/uL   RBC 3.82 (L) 4.22 - 5.81 MIL/uL   Hemoglobin 11.5 (L) 13.0 - 17.0 g/dL   HCT 35.4 (L) 39.0 - 52.0 %   MCV 92.7 78.0 - 100.0 fL   MCH 30.1 26.0 - 34.0 pg   MCHC 32.5 30.0 - 36.0 g/dL   RDW 14.3 11.5 - 15.5 %   Platelets 131 (L) 150 - 400 K/uL  Glucose, capillary     Status: Abnormal   Collection Time: 10/26/14  7:16 AM  Result Value Ref Range   Glucose-Capillary 108 (H) 65 - 99 mg/dL  Glucose, capillary     Status: Abnormal   Collection Time: 10/26/14 11:45 AM  Result Value Ref Range   Glucose-Capillary 63 (L) 65 - 99 mg/dL   Comment 1 D   Glucose, capillary     Status: Abnormal   Collection Time: 10/26/14 12:33 PM  Result Value Ref Range    Glucose-Capillary 135 (H) 65 - 99 mg/dL  Glucose, capillary     Status: Abnormal   Collection Time: 10/26/14  4:17 PM  Result Value Ref Range   Glucose-Capillary 149 (H) 65 - 99 mg/dL   Comment 1 Notify RN    Comment 2 Document in Chart   Glucose, capillary     Status: Abnormal   Collection Time: 10/26/14  9:30 PM  Result Value Ref Range   Glucose-Capillary 137 (H) 65 - 99 mg/dL   Comment 1 Notify RN   Basic metabolic panel     Status: Abnormal   Collection Time: 10/27/14  4:10 AM  Result Value Ref Range   Sodium 139 135 - 145 mmol/L   Potassium 4.4 3.5 - 5.1 mmol/L   Chloride 107 101 - 111 mmol/L   CO2 23 22 - 32 mmol/L   Glucose, Bld 131 (H) 65 - 99 mg/dL   BUN 45 (H) 6 - 20 mg/dL   Creatinine, Ser 2.44 (H) 0.61 - 1.24 mg/dL   Calcium 8.5 (L) 8.9 - 10.3 mg/dL   GFR calc non Af Amer 25 (L) >60 mL/min   GFR calc Af Amer 28 (L) >60 mL/min    Comment: (NOTE) The eGFR has been calculated using the CKD EPI equation. This calculation has not been validated in all clinical situations. eGFR's persistently <60 mL/min signify possible Chronic Kidney Disease.    Anion gap 9 5 - 15  Glucose, capillary     Status: None   Collection Time: 10/27/14  7:22 AM  Result Value Ref Range   Glucose-Capillary 95 65 - 99 mg/dL    Dg Ankle Complete Left  10/27/2014   CLINICAL DATA:  Status post fall; left ankle pain. Initial encounter.  EXAM: LEFT ANKLE COMPLETE - 3+ VIEW  COMPARISON:  None.  FINDINGS: There is a mildly displaced oblique fracture across the distal fibular diaphysis, with mild posterior displacement. The interosseous space appears grossly intact. No definite ankle mortise widening is seen at this time.  No definite additional fractures are seen. A small osseous fragment superior to the posterior talus may reflect remote injury. There is suggestion of an osteochondral defect at the medial aspect of the talar dome.  Soft tissue swelling is noted about the ankle. Diffuse vascular  calcifications are seen.  IMPRESSION: 1. Mildly displaced oblique fracture across the distal fibular diaphysis, with mild posterior displacement. 2. Suggestion of osteochondral defect at the medial aspect of the talar dome. 3. Small osseous fragment superior to the posterior talus may reflect remote injury. 4. Diffuse vascular calcifications seen.   Electronically Signed   By: Garald Balding [name]   On: 10/27/2014 00:59   Dg Foot Complete Right  10/26/2014   CLINICAL DATA:  Uncontrolled diabetes, hypertension. Warmth and erythema to the great toe, new today.  EXAM: RIGHT FOOT COMPLETE - 3+ VIEW  COMPARISON:  None.  FINDINGS: There is prior amputation of the second digit at the MTP joint. There is amputation of the third distal phalanx. There is severe joint space narrowing and osteophyte formation about the first MTP joint with prominent sclerosis. No bony destruction is evident.  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 No acute findings are evident. Prior amputations as detailed above.   Electronically Signed   By: Andreas Newport [name]   On: 10/26/2014 02:01      Physical Exam:  Alert and oriented. Minimal pain at moment Left ankle shows diffuse moderate swelling and small superficial abrasion medially that has been dressed. It is not full thickness, skin about remainder ankle intact. Moderate tenderness about the distal fibula. Sensation grossly intact and can DF/PF ankle and toes   XRAYS reviewed. As above Vitals Temp:  [97.9 F (36.6 C)-98.6 F (37 C)] 98 F (36.7 C) (06/13 0439) Pulse Rate:  [60-70] 66 (06/13 0800) Resp:  [18-20] 20 (06/13 0439) BP: (121-171)/(39-50) 132/48 mmHg (06/13 0800) SpO2:  [96 %-98 %] 96 % (06/13 0439) Weight:  [132.5 kg (292 lb 1.8 oz)] 132.5 kg (292 lb 1.8 oz) (06/13 0439) Body mass index is 35.57 kg/(m^2).  Assessment/Plan: Impression: Left minimally displaced distal fibula fracture                     Right great toe  infection Treatment: At this time the overall fracture alignment shows no widening of the sendesmosis so this should do well with conservative treatment. He does need to be at protected weight bearing with TDWB with cam walker for immobilization. Will order PT and recommend follow up as outpatient for repeat xrays in 1 week to make sure no displacement. Ice and elevate, may remove cam walker for hygiene and skin checks   Continue antibiotics and current treatment for right great toe per medicine.   [facility] for Dr. Justice Britain 10/27/2014, 8:11 AM  Contact # [phone]

## 2014-10-27 NOTE — Clinical Documentation Improvement (Signed)
Chronic kidney disease stage III; renal insufficiency; worsening renal function noted in current record.  Renal labs as below.  Please document in your progress note any additional clinical conditions present related to the worsening renal labs and CKD.  Possible Clinical Conditions: -Acute kidney injury on CKD 3 -Other condition (please specify) -Unable to determine at present  Thank you, Mateo Flow, RN [phone] Clinical Documentation Specialist

## 2014-10-27 NOTE — Progress Notes (Signed)
Pt c/o of new pain in Left knee. Baltazar Najjar NP notified, orders received. Noreene Larsson RN, BSN

## 2014-10-27 NOTE — Progress Notes (Signed)
Orthopedic Tech Progress Note Patient Details:  David [name] 1940-05-28 [id number]  Ortho Devices Type of Ortho Device: CAM walker Ortho Device/Splint Interventions: Ordered   Irish Elders 10/27/2014, 10:15 AM

## 2014-10-28 ENCOUNTER — Inpatient Hospital Stay (HOSPITAL_COMMUNITY): Payer: Medicare Other

## 2014-10-28 DIAGNOSIS — I2510 Atherosclerotic heart disease of native coronary artery without angina pectoris: Secondary | ICD-10-CM

## 2014-10-28 LAB — BASIC METABOLIC PANEL
Anion gap: 6 (ref 5–15)
BUN: 38 mg/dL — ABNORMAL HIGH (ref 6–20)
CO2: 23 mmol/L (ref 22–32)
Calcium: 8.2 mg/dL — ABNORMAL LOW (ref 8.9–10.3)
Chloride: 106 mmol/L (ref 101–111)
Creatinine, Ser: 2.07 mg/dL — ABNORMAL HIGH (ref 0.61–1.24)
GFR calc Af Amer: 35 mL/min — ABNORMAL LOW (ref >60)
GFR calc non Af Amer: 30 mL/min — ABNORMAL LOW (ref >60)
Glucose, Bld: 177 mg/dL — ABNORMAL HIGH (ref 65–99)
Potassium: 4.3 mmol/L (ref 3.5–5.1)
Sodium: 135 mmol/L (ref 135–145)

## 2014-10-28 LAB — WOUND CULTURE
Gram Stain: NONE SEEN
Special Requests: NORMAL

## 2014-10-28 LAB — GLUCOSE, CAPILLARY
Glucose-Capillary: 88 mg/dL (ref 65–99)
Glucose-Capillary: 138 mg/dL — ABNORMAL HIGH (ref 65–99)
Glucose-Capillary: 119 mg/dL — ABNORMAL HIGH (ref 65–99)
Glucose-Capillary: 132 mg/dL — ABNORMAL HIGH (ref 65–99)
Glucose-Capillary: 105 mg/dL — ABNORMAL HIGH (ref 65–99)
Glucose-Capillary: 95 mg/dL (ref 65–99)
Glucose-Capillary: 138 mg/dL — ABNORMAL HIGH (ref 65–99)

## 2014-10-28 LAB — CBC
HCT: 33 % — ABNORMAL LOW (ref 39.0–52.0)
Hemoglobin: 10.8 g/dL — ABNORMAL LOW (ref 13.0–17.0)
MCH: 30.3 pg (ref 26.0–34.0)
MCHC: 32.7 g/dL (ref 30.0–36.0)
MCV: 92.7 fL (ref 78.0–100.0)
Platelets: 123 10*3/uL — ABNORMAL LOW (ref 150–400)
RBC: 3.56 MIL/uL — ABNORMAL LOW (ref 4.22–5.81)
RDW: 14.4 % (ref 11.5–15.5)
WBC: 6.2 10*3/uL (ref 4.0–10.5)

## 2014-10-28 MED ORDER — LORAZEPAM 0.5 MG PO TABS
0.5000 mg | ORAL_TABLET | Freq: Once | ORAL | Status: AC
  Administered 2014-10-28: 0.5 mg via ORAL
  Filled 2014-10-28: qty 1

## 2014-10-28 MED ORDER — ZOLPIDEM TARTRATE 5 MG PO TABS: 5.0000 mg | ORAL_TABLET | Freq: Every evening | ORAL | Status: DC | PRN

## 2014-10-28 MED ORDER — LORAZEPAM 1 MG PO TABS
1.0000 mg | ORAL_TABLET | Freq: Four times a day (QID) | ORAL | Status: DC | PRN
  Administered 2014-10-28: 1 mg via ORAL
  Filled 2014-10-28 (×2): qty 1

## 2014-10-28 NOTE — Progress Notes (Signed)
ANTIBIOTIC CONSULT NOTE - INITIAL  Pharmacy Consult for Zosyn Indication: Open toe wound, diabetic cellulitis  No Known Allergies  Patient Measurements: Height: 6\' 4"  (193 cm) Weight: 292 lb 1.8 oz (132.5 kg) IBW/kg (Calculated) : 86.8 Adjusted Body Weight:   Vital Signs: Temp: 98.2 F (36.8 C) (06/14 0548) Temp Source: Oral (06/14 0548) BP: 125/57 mmHg (06/14 1027) Pulse Rate: 60 (06/14 1027) Intake/Output from previous day: 06/13 0701 - 06/14 0700 In: 2026.3 [P.O.:720; I.V.:1206.3; IV Piggyback:100] Out: 2175 [Urine:2175] Intake/Output from this shift:    Labs:  Recent Labs  10/25/14 2247 10/26/14 0436 10/27/14 0410 10/28/14 0413  WBC 10.8* 9.1  --  6.2  HGB 12.5* 11.5*  --  10.8*  PLT 143* 131*  --  123*  CREATININE 1.86*  --  2.44* 2.07*   Estimated Creatinine Clearance: 46.5 mL/min (by C-G formula based on Cr of 2.07). No results for input(s): VANCOTROUGH, VANCOPEAK, VANCORANDOM, GENTTROUGH, GENTPEAK, GENTRANDOM, TOBRATROUGH, TOBRAPEAK, TOBRARND, AMIKACINPEAK, AMIKACINTROU, AMIKACIN in the last 72 hours.   Microbiology: Recent Results (from the past 720 hour(s))  Urine culture     Status: None   Collection Time: 10/25/14 10:41 PM  Result Value Ref Range Status   Specimen Description URINE, CLEAN CATCH  Final   Special Requests NONE  Final   Colony Count NO GROWTH Performed at [facility]   Final   Culture NO GROWTH Performed at [facility]   Final   Report Status 10/26/2014 FINAL  Final  Blood culture (routine x 2)     Status: None (Preliminary result)   Collection Time: 10/25/14 10:47 PM  Result Value Ref Range Status   Specimen Description BLOOD RIGHT HAND  Final   Special Requests BOTTLES DRAWN AEROBIC AND ANAEROBIC  Final   Culture   Final           BLOOD CULTURE RECEIVED NO GROWTH TO DATE CULTURE WILL BE HELD FOR 5 DAYS BEFORE ISSUING A FINAL NEGATIVE REPORT Note: Culture results may be compromised due to an inadequate volume  of blood received in culture bottles. Performed at [facility]    Report Status PENDING  Incomplete  Blood culture (routine x 2)     Status: None (Preliminary result)   Collection Time: 10/25/14 10:47 PM  Result Value Ref Range Status   Specimen Description BLOOD LEFT HAND  Final   Special Requests BOTTLES DRAWN AEROBIC AND ANAEROBIC  Final   Culture   Final           BLOOD CULTURE RECEIVED NO GROWTH TO DATE CULTURE WILL BE HELD FOR 5 DAYS BEFORE ISSUING A FINAL NEGATIVE REPORT Performed at [facility]    Report Status PENDING  Incomplete  Wound culture     Status: None (Preliminary result)   Collection Time: 10/26/14 12:32 AM  Result Value Ref Range Status   Specimen Description TOE RIGHT  Final   Special Requests Normal  Final   Gram Stain PENDING  Incomplete   Culture   Final    ABUNDANT PSEUDOMONAS AERUGINOSA Performed at [facility]    Report Status PENDING  Incomplete   Organism ID, Bacteria PSEUDOMONAS AERUGINOSA  Final      Susceptibility   Pseudomonas aeruginosa - MIC*    CEFEPIME <=1 SENSITIVE Sensitive     CEFTAZIDIME 2 SENSITIVE Sensitive     CIPROFLOXACIN <=0.25 SENSITIVE Sensitive     GENTAMICIN <=1 SENSITIVE Sensitive     IMIPENEM 2 SENSITIVE Sensitive  PIP/TAZO 8 SENSITIVE Sensitive     TOBRAMYCIN <=1 SENSITIVE Sensitive     * ABUNDANT PSEUDOMONAS AERUGINOSA   Medical History: Past Medical History  Diagnosis Date  . Diabetes   . Hypertension   . CAD (coronary artery disease)   . Renal insufficiency   . Sleep apnea   . Palpitations   . Dyspnea   . Sinus bradycardia   . Obesity   . Allergy   . Kidney failure   . Arthritis   . Diabetes   . Anxiety   . Cancer     skin   Medications:  Anti-infectives    Start     Dose/Rate Route Frequency Ordered Stop   10/26/14 1800  vancomycin (VANCOCIN) 1,250 mg in sodium chloride 0.9 % 250 mL IVPB  Status:  Discontinued     1,250 mg 166.7 mL/hr over 90 Minutes Intravenous Every  24 hours 10/26/14 0505 10/27/14 1114   10/26/14 0600  piperacillin-tazobactam (ZOSYN) IVPB 3.375 g     3.375 g 12.5 mL/hr over 240 Minutes Intravenous 3 times per day 10/26/14 0505     10/26/14 0130  piperacillin-tazobactam (ZOSYN) IVPB 3.375 g     3.375 g 12.5 mL/hr over 240 Minutes Intravenous  Once 10/26/14 0124 10/26/14 0535   10/25/14 2245  vancomycin (VANCOCIN) 1,750 mg in sodium chloride 0.9 % 500 mL IVPB     1,750 mg 250 mL/hr over 120 Minutes Intravenous  Once 10/25/14 2235 10/26/14 0133     Assessment:  74 yiN with Type 2 DM on an insulin pump, hypertension, coronary artery disease, active sleep apnea, obesity, stage III chronic kidney disease. Hx of a diabetic wound ulcer on the plantar surface of his right great toe over the past 3 months. Over the past 48 hours, this toe has become red and warm to touch.   NM scan doubtful for osteomyelitis, wound cx Pseudomonas, sens to Zosyn, Cipro  L ankle fx from fall  Goal of Therapy:  Zosyn dosed based on patient weight and renal function   Plan:   No change to Zosyn 3.375g IV Q8H infused over 4hrs.   Minda Ditto PharmD Pager [phone] 10/28/2014, 11:22 AM

## 2014-10-28 NOTE — Progress Notes (Addendum)
Patient Demographics  David [name], is a 74 y.o. male, DOB - [birth year], [id number]  Admit date - 10/25/2014   Admitting Physician Truett Mainland, DO  Outpatient Primary MD for the patient is [name], [place]  LOS - 2   Chief Complaint  Patient presents with  . Fever       Admission HPI/Brief narrative: David [name] is a 74 y.o. male With diabetes on an insulin pump, hypertension, coronary artery disease, active sleep apnea, obesity, stage III chronic kidney disease. Patient has been dealing with a diabetic wound ulcer on the plantar surface of his right great toe over the past 3 months. He's been seeing a podiatrist who has been managing this. presents with fever ,  treated for infected right great toe , and right lower extremity cellulitis .Unfortunately patient had a fall during hospital stay , where he sustained left ankle fracture.  Subjective:   David [name] today has, No headache, No chest pain, No abdominal pain - No Nausea, No new weakness tingling or numbness, No Cough - SOB.   Assessment & Plan    Active Problems:   DM (diabetes mellitus), type 2 with complications   Essential hypertension   Renal insufficiency   Cardiovascular disease   Open toe wound   Cellulitis of great toe of right foot   OSA on CPAP  Right great toe infection /right lower extremity cellulitis : - Initially on broad-spectrum IV antibiotics including vancomycin and Zosyn 6/12 , stopped IV vancomycin 6/13 is one culture growing Pseudomonas, continue Zosyn as one culture growing abundant Pseudomonas aeruginosa, unclear of this is right great toe infection (as no discharge or erythema in the toe itself, and wound culture is just colonization), or severe right lower extremity cellulitis involving the ankle , plan is to continue with IV antibiotics today, and transitioned to oral keflex  tomorrow on  discharge was discussed with infectious disease Dr. Megan Salon over the phone. - Wound culture: Pseudomonas aeruginosa  - Blood cultures: No growth to date - not able to obtain MRI given pacemaker to rule out posterior myelitis, no IV contrast given his CKD , - 3-phase nuclear scan showing evidence of soft tissue inflammation, but not convincing for evidence of osteomyelitis.   left ankle fracture status post fall during hospital stay  - Orthopedic input greatly appreciated, nonoperative fracture , PT consulted.  [place] on chronic kidney disease due to 3 -Baseline around 1.7- 2, creatinine has peaked at  2.4, today is 2, , continue to hold torsemide and lisinopril. Recheck BMP in a.m..  diabetes mellitus - Patient continue to use his insulin pump,on liraglutide, CBGs are acceptable,  hemoglobin A1c 8.7  Hypertension - Acceptable, continue with home medication  History of coronary artery disease - Denies any chest pain or shortness of breath, continue with aspirin, beta blockers, Imdur.  Obstructive sleep apnea - Continue with C Pap    Code Status: Full  Family Communication: Wife  at bedside.  Disposition Plan: Pending PT evaluation. Hopefully in 24 hours, if creatinine continues to improve, and patient remains a febrile.   Procedures  None   Consults   Orthopedic Dr. supple   Medications  Scheduled Meds: . amLODipine  5 mg Oral Daily  .  aspirin EC  81 mg Oral Daily  . calcitRIOL  0.25 mcg Oral Daily  . carvedilol  25 mg Oral BID WC  . doxazosin  4 mg Oral Daily  . enoxaparin (LOVENOX) injection  40 mg Subcutaneous Q24H  . insulin pump   Subcutaneous TID AC, HS, 0200  . isosorbide mononitrate  60 mg Oral Daily  . Liraglutide  1.8 mg Subcutaneous Daily  . pantoprazole  40 mg Oral Daily  . piperacillin-tazobactam (ZOSYN)  IV  3.375 g Intravenous 3 times per day   Continuous Infusions: . sodium chloride 75 mL/hr at 10/27/14 0553   PRN Meds:.acetaminophen **OR**  acetaminophen, alum & mag hydroxide-simeth, HYDROcodone-acetaminophen, ondansetron **OR** ondansetron (ZOFRAN) IV  DVT Prophylaxis  Lovenox -  Lab Results  Component Value Date   PLT 123* 10/28/2014    Antibiotics   Anti-infectives    Start     Dose/Rate Route Frequency Ordered Stop   10/26/14 1800  vancomycin (VANCOCIN) 1,250 mg in sodium chloride 0.9 % 250 mL IVPB  Status:  Discontinued     1,250 mg 166.7 mL/hr over 90 Minutes Intravenous Every 24 hours 10/26/14 0505 10/27/14 1114   10/26/14 0600  piperacillin-tazobactam (ZOSYN) IVPB 3.375 g     3.375 g 12.5 mL/hr over 240 Minutes Intravenous 3 times per day 10/26/14 0505     10/26/14 0130  piperacillin-tazobactam (ZOSYN) IVPB 3.375 g     3.375 g 12.5 mL/hr over 240 Minutes Intravenous  Once 10/26/14 0124 10/26/14 0535   10/25/14 2245  vancomycin (VANCOCIN) 1,750 mg in sodium chloride 0.9 % 500 mL IVPB     1,750 mg 250 mL/hr over 120 Minutes Intravenous  Once 10/25/14 2235 10/26/14 0133          Objective:   Filed Vitals:   10/27/14 1354 10/27/14 2026 10/28/14 0548 10/28/14 1027  BP: 158/52 154/58 160/55 125/57  Pulse: 59 61 60 60  Temp: 97.8 F (36.6 C) 98.4 F (36.9 C) 98.2 F (36.8 C)   TempSrc: Oral Oral Oral   Resp: 18 18 18    Height:      Weight:      SpO2: 98% 97% 100%     Wt Readings from Last 3 Encounters:  10/27/14 132.5 kg (292 lb 1.8 oz)  10/16/14 122.925 kg (271 lb)  09/16/14 125.102 kg (275 lb 12.8 oz)     Intake/Output Summary (Last 24 hours) at 10/28/14 1032 Last data filed at 10/28/14 0655  Gross per 24 hour  Intake 1786.25 ml  Output   1775 ml  Net  11.25 ml     Physical Exam  Awake Alert, Oriented X 3, No new F.N deficits, Normal affect David [name],PERRAL Supple Neck,No JVD, No cervical lymphadenopathy appriciated.  Symmetrical Chest wall movement, Good air movement bilaterally, CTAB RRR,No Gallops,Rubs or new Murmurs, No Parasternal Heave +ve B.Sounds, Abd Soft, No tenderness, No  organomegaly appriciated, No rebound - guarding or rigidity. Diminished pulses bilaterally, but no evidence of ischemia or cyanosis, right foot and lower extremities cellulitis significantly subsided, right great toe with 5 mm diabetic wound ulcer on the plantar surface, agent has no feeling in that area, second and third toe amputated.Left ankle with cam walker.   Data Review   Micro Results Recent Results (from the past 240 hour(s))  Urine culture     Status: None   Collection Time: 10/25/14 10:41 PM  Result Value Ref Range Status   Specimen Description URINE, CLEAN CATCH  Final   Special Requests NONE  Final   Colony Count NO GROWTH Performed at [facility]   Final   Culture NO GROWTH Performed at [facility]   Final   Report Status 10/26/2014 FINAL  Final  Blood culture (routine x 2)     Status: None (Preliminary result)   Collection Time: 10/25/14 10:47 PM  Result Value Ref Range Status   Specimen Description BLOOD RIGHT HAND  Final   Special Requests BOTTLES DRAWN AEROBIC AND ANAEROBIC  Final   Culture   Final           BLOOD CULTURE RECEIVED NO GROWTH TO DATE CULTURE WILL BE HELD FOR 5 DAYS BEFORE ISSUING A FINAL NEGATIVE REPORT Note: Culture results may be compromised due to an inadequate volume of blood received in culture bottles. Performed at [facility]    Report Status PENDING  Incomplete  Blood culture (routine x 2)     Status: None (Preliminary result)   Collection Time: 10/25/14 10:47 PM  Result Value Ref Range Status   Specimen Description BLOOD LEFT HAND  Final   Special Requests BOTTLES DRAWN AEROBIC AND ANAEROBIC  Final   Culture   Final           BLOOD CULTURE RECEIVED NO GROWTH TO DATE CULTURE WILL BE HELD FOR 5 DAYS BEFORE ISSUING A FINAL NEGATIVE REPORT Performed at [facility]    Report Status PENDING  Incomplete  Wound culture     Status: None (Preliminary result)   Collection Time: 10/26/14 12:32 AM  Result  Value Ref Range Status   Specimen Description TOE RIGHT  Final   Special Requests Normal  Final   Gram Stain PENDING  Incomplete   Culture   Final    ABUNDANT PSEUDOMONAS AERUGINOSA Performed at [facility]    Report Status PENDING  Incomplete   Organism ID, Bacteria PSEUDOMONAS AERUGINOSA  Final      Susceptibility   Pseudomonas aeruginosa - MIC*    CEFEPIME <=1 SENSITIVE Sensitive     CEFTAZIDIME 2 SENSITIVE Sensitive     CIPROFLOXACIN <=0.25 SENSITIVE Sensitive     GENTAMICIN <=1 SENSITIVE Sensitive     IMIPENEM 2 SENSITIVE Sensitive     PIP/TAZO 8 SENSITIVE Sensitive     TOBRAMYCIN <=1 SENSITIVE Sensitive     * ABUNDANT PSEUDOMONAS AERUGINOSA    Radiology Reports Dg Knee 1-2 Views Left  10/27/2014   CLINICAL DATA:  Status post fall 10/24/2004 with the distal left fibula fracture. Medial left knee pain today with swelling. Initial encounter.  EXAM: LEFT KNEE - 1-2 VIEW  COMPARISON:  None.  FINDINGS: The patient has a small joint effusion. No fracture is identified. Moderate to advanced degenerative disease about the knee appears worst in the medial compartment. Atherosclerosis is noted.  IMPRESSION: Negative for fracture.  Small joint effusion.  Osteoarthritis.   Electronically Signed   By: Inge Rise [name]   On: 10/27/2014 21:00   Dg Ankle Complete Left  10/27/2014   CLINICAL DATA:  Status post fall; left ankle pain. Initial encounter.  EXAM: LEFT ANKLE COMPLETE - 3+ VIEW  COMPARISON:  None.  FINDINGS: There is a mildly displaced oblique fracture across the distal fibular diaphysis, with mild posterior displacement. The interosseous space appears grossly intact. No definite ankle mortise widening is seen at this time.  No definite additional fractures are seen. A small osseous fragment superior to the posterior talus may reflect remote injury. There is suggestion of an osteochondral defect at  the medial aspect of the talar dome.  Soft tissue swelling is noted about the  ankle. Diffuse vascular calcifications are seen.  IMPRESSION: 1. Mildly displaced oblique fracture across the distal fibular diaphysis, with mild posterior displacement. 2. Suggestion of osteochondral defect at the medial aspect of the talar dome. 3. Small osseous fragment superior to the posterior talus may reflect remote injury. 4. Diffuse vascular calcifications seen.   Electronically Signed   By: Garald Balding [name]   On: 10/27/2014 00:59   Dg Ankle Complete Right  10/28/2014   CLINICAL DATA:  Right ankle pain, erythema, swelling  EXAM: RIGHT ANKLE - COMPLETE 3+ VIEW  COMPARISON:  10/26/1998 16 foot radiographs  FINDINGS: There is no evidence of fracture, dislocation, or joint effusion. There is no evidence of arthropathy or other focal bone abnormality. Mild diffuse soft tissue edema is noted with vascular calcifications. Plantar calcaneal spurring noted.  IMPRESSION: Minimal diffuse soft tissue edema.   Electronically Signed   By: Conchita Paris [name]   On: 10/28/2014 10:03   Dg Foot Complete Right  10/26/2014   CLINICAL DATA:  Uncontrolled diabetes, hypertension. Warmth and erythema to the great toe, new today.  EXAM: RIGHT FOOT COMPLETE - 3+ VIEW  COMPARISON:  None.  FINDINGS: There is prior amputation of the second digit at the MTP joint. There is amputation of the third distal phalanx. There is severe joint space narrowing and osteophyte formation about the first MTP joint with prominent sclerosis. No bony destruction is evident.  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 No acute findings are evident. Prior amputations as detailed above.   Electronically Signed   By: Andreas Newport [name]   On: 10/26/2014 02:01   Nm Bone Scan 3 Phase Lower Extremity  10/27/2014   CLINICAL DATA:  Diabetic ulcer. Great toe infection. Query cellulitis versus osteomyelitis.  EXAM: NUCLEAR MEDICINE 3-PHASE BONE SCAN  TECHNIQUE: Radionuclide angiographic images, immediate static blood pool  images, and 3-hour delayed static images were obtained of the both feet after intravenous injection of radiopharmaceutical.  RADIOPHARMACEUTICALS:  25 Technetium-63m MDP IV  COMPARISON:  Radiographs from 10/27/2014 and 10/26/2014  FINDINGS: Prior right second toe amputation and prior amputation of the distal phalanx of the third toe.  Vascular phase: Flow images demonstrate vaguely increased uptake in the vicinity of the right MCP joints and right great toe. There is also some generalized increased soft tissue uptake in the right foot and ankle.  Blood pool phase: Blood pool images demonstrate a greater degree of activity along the right first MTP joint and right great toe, along with faintly increased uptake in the ankles.  Delayed phase: Focally high activity in the first MCP joint, with foci of high activity along the malleoli.  IMPRESSION: 1. High activity along the first MCP joint on all 3 phases, but on conventional radiographs there is prominent sclerosis, spurring, and severe loss of articular space in this region which appears degenerated and which could explain inflammation and high activity. There is likely some degree of soft tissue inflammation in the great toe given the diffuse subcutaneous activity on blood pool and flow phase images in this vicinity, and the possibility of cellulitis is certainly raised. I am doubtful of true osteomyelitis. 2. Vaguely increased activity along both the medial and lateral malleoli bilaterally. The patient has a known healing left lateral malleolar fracture. It may be prudent to image the right ankle as well, given the somewhat similar high activity along the right  malleoli.   Electronically Signed   By: Van Clines [name]   On: 10/27/2014 19:47     CBC  Recent Labs Lab 10/25/14 2247 10/26/14 0436 10/28/14 0413  WBC 10.8* 9.1 6.2  HGB 12.5* 11.5* 10.8*  HCT 38.3* 35.4* 33.0*  PLT 143* 131* 123*  MCV 92.7 92.7 92.7  MCH 30.3 30.1 30.3  MCHC 32.6 32.5  32.7  RDW 14.1 14.3 14.4  LYMPHSABS 0.6*  --   --   MONOABS 1.1*  --   --   EOSABS 0.1  --   --   BASOSABS 0.0  --   --     Chemistries   Recent Labs Lab 10/25/14 2247 10/27/14 0410 10/28/14 0413  NA 137 139 135  K 5.1 4.4 4.3  CL 105 107 106  CO2 23 23 23   GLUCOSE 133* 131* 177*  BUN 39* 45* 38*  CREATININE 1.86* 2.44* 2.07*  CALCIUM 9.2 8.5* 8.2*   ------------------------------------------------------------------------------------------------------------------ estimated creatinine clearance is 46.5 mL/min (by C-G formula based on Cr of 2.07). ------------------------------------------------------------------------------------------------------------------ No results for input(s): HGBA1C in the last 72 hours. ------------------------------------------------------------------------------------------------------------------ No results for input(s): CHOL, HDL, LDLCALC, TRIG, CHOLHDL, LDLDIRECT in the last 72 hours. ------------------------------------------------------------------------------------------------------------------ No results for input(s): TSH, T4TOTAL, T3FREE, THYROIDAB in the last 72 hours.  Invalid input(s): FREET3 ------------------------------------------------------------------------------------------------------------------ No results for input(s): VITAMINB12, FOLATE, FERRITIN, TIBC, IRON, RETICCTPCT in the last 72 hours.  Coagulation profile No results for input(s): INR, PROTIME in the last 168 hours.  No results for input(s): DDIMER in the last 72 hours.  Cardiac Enzymes No results for input(s): CKMB, TROPONINI, MYOGLOBIN in the last 168 hours.  Invalid input(s): CK ------------------------------------------------------------------------------------------------------------------ Invalid input(s): POCBNP     Time Spent in minutes   30 minutes   Esaul Dorwart M.D on 10/28/2014 at 10:32 AM  Between 7am to 7pm - Pager -  [phone]  After 7pm go to [email or web address] - password [facility]: [place] Rhonda W  [facility]   Office  [phone]

## 2014-10-28 NOTE — Progress Notes (Signed)
Inpatient Diabetes Program Recommendations  AACE/ADA: New Consensus Statement on Inpatient Glycemic Control (2013)  Target Ranges:  Prepandial:   less than 140 mg/dL      Peak postprandial:   less than 180 mg/dL (1-2 hours)      Critically ill patients:  140 - 180 mg/dL     Results for David [name], David [name] (MRN [id number]) as of 10/28/2014 10:10  Ref. Range 10/27/2014 07:22 10/27/2014 11:56 10/27/2014 16:15 10/27/2014 21:34  Glucose-Capillary Latest Ref Range: 65-99 mg/dL 95 153 (H) 174 (H) 177 (H)    Results for David [name], David [name] (MRN [id number]) as of 10/28/2014 10:10  Ref. Range 10/28/2014 01:17 10/28/2014 01:52 10/28/2014 06:37 10/28/2014 07:39  Glucose-Capillary Latest Ref Range: 65-99 mg/dL 88 138 (H) 119 (H) 132 (H)    Current DM Orders: Insulin Pump            Victoza 1.8 mg daily     **Patient's CBGs well controlled on personal insulin pump.  **Patient changed set/site of insulin pump yesterday 10/27/14.  **Remains A&O and able to independently manage insulin pump.    Will follow Wyn Quaker RN, MSN, CDE Diabetes Coordinator Inpatient Glycemic Control Team Team Pager: [phone] (8a-5p)

## 2014-10-28 NOTE — Evaluation (Addendum)
Physical Therapy Evaluation Patient Details Name: David [name] MRN: [id number] DOB: Jun 03, 1940 Today's Date: 10/28/2014   History of Present Illness  74 yo male admitted with R great toe cellulitis. Sustained L distal fibula fx during hospital stay. Hx of DM, HTn, obesity, neuropathy.   Clinical Impression  On eval, pt required max assist to stand, min assist for gait and wheelchair mobility. Max cues during session for pt to maintain TDWB status. Discussed d/c plan with pt/family-plan is for home with HHPT. Recommend RW, wheelchair with L elevating legrest, 3n1, and ambulance transport home. Do not feel pt/family will be able to safely get up one large step to get into home. Discussed mobility at home-recommended to pt and family that he limit mobility to stand pivots only and to use wheelchair to get around home.     Follow Up Recommendations Home health PT;Supervision/Assistance - 24 hour; Home Health OT; Home Health Aide    Equipment Recommendations  Wheelchair (22 in;lightweight;L elevating legrest);3in1;Rolling walker with 5" wheels. Ambulance transport home.   Recommendations for Other Services       Precautions / Restrictions Precautions Precautions: Fall Required Braces or Orthoses: Other Brace/Splint Other Brace/Splint: L CAM boot Restrictions Weight Bearing Restrictions: Yes LLE Weight Bearing: Touchdown weight bearing Other Position/Activity Restrictions: with CAM boot      Mobility  Bed Mobility               General bed mobility comments: pt sitting EOB  Transfers Overall transfer level: Needs assistance Equipment used: Rolling walker (2 wheeled) Transfers: Sit to/from Stand Sit to Stand: Max assist;From elevated surface         General transfer comment: Assist to rise, stabilize. VCS safety, techinique, hand placement, adherence to WB status. Pt unable to successfully maintain TDWB during sit<> stand transition.   Ambulation/Gait Ambulation/Gait  assistance: Min assist Ambulation Distance (Feet): 6 Feet Assistive device: Rolling walker (2 wheeled) Gait Pattern/deviations: Step-to pattern;Trunk flexed     General Gait Details: Assist to stabilize. VCs for pt to maintain TDWB status-did fairly well. Fatigues quickly.   Stairs            Information systems manager mobility: Yes Wheelchair propulsion: Both upper extremities Wheelchair parts: Needs assistance Distance: Engineering geologist Details (indicate cue type and reason): Assist to steer at times especially with tight spaces-Min assist. Pt fatigues fairly quickly.   Modified Rankin (Stroke Patients Only)       Balance Overall balance assessment: Needs assistance;History of Falls         Standing balance support: During functional activity Standing balance-Leahy Scale: Poor Standing balance comment: needs RW                             Pertinent Vitals/Pain Pain Assessment: No/denies pain    Home Living Family/patient expects to be discharged to:: Private residence Living Arrangements: Spouse/significant other Available Help at Discharge: Family Type of Home: House Home Access: Stairs to enter Entrance Stairs-Rails: None Entrance Stairs-Number of Steps: incline walkway then 1 8" step Home Layout: One level Home Equipment: None      Prior Function Level of Independence: Independent               Hand Dominance        Extremity/Trunk Assessment   Upper Extremity Assessment: Defer to OT evaluation           Lower Extremity Assessment: Generalized weakness;LLE deficits/detail  LLE Deficits / Details: knee ext at least 3/5-did not MMT. CAM boot in place  Cervical / Trunk Assessment: Normal  Communication   Communication: No difficulties  Cognition Arousal/Alertness: Awake/alert Behavior During Therapy: WFL for tasks assessed/performed Overall Cognitive Status: Within Functional Limits  for tasks assessed                      General Comments      Exercises        Assessment/Plan    PT Assessment Patient needs continued PT services  PT Diagnosis Difficulty walking;Abnormality of gait   PT Problem List Decreased strength;Decreased activity tolerance;Decreased balance;Decreased mobility;Obesity;Decreased knowledge of use of DME;Decreased knowledge of precautions;Decreased safety awareness;Pain  PT Treatment Interventions DME instruction;Gait training;Functional mobility training;Therapeutic activities;Therapeutic exercise;Patient/family education;Balance training   PT Goals (Current goals can be found in the Care Plan section) Acute Rehab PT Goals Patient Stated Goal: home. regain independence and mobility PT Goal Formulation: With patient/family Time For Goal Achievement: 11/11/14 Potential to Achieve Goals: Good    Frequency Min 3X/week   Barriers to discharge        Co-evaluation               End of Session Equipment Utilized During Treatment: Gait belt Activity Tolerance: Patient limited by fatigue Patient left: in chair;with call bell/phone within reach;with family/visitor present (Made pt and RN aware that pt should't sit with leg dependent for longer than 1 hour on today to limit swellilng. )           [id number] PT Time Calculation (min) (ACUTE ONLY): 23 min   Charges:   PT Evaluation $Initial PT Evaluation Tier I: 1 Procedure PT Treatments $Therapeutic Activity: 8-22 mins   PT G Codes:        Weston Anna, MPT Pager: [phone]

## 2014-10-29 DIAGNOSIS — L03031 Cellulitis of right toe: Principal | ICD-10-CM

## 2014-10-29 DIAGNOSIS — S82892A Other fracture of left lower leg, initial encounter for closed fracture: Secondary | ICD-10-CM | POA: Diagnosis present

## 2014-10-29 DIAGNOSIS — I10 Essential (primary) hypertension: Secondary | ICD-10-CM

## 2014-10-29 DIAGNOSIS — E118 Type 2 diabetes mellitus with unspecified complications: Secondary | ICD-10-CM

## 2014-10-29 DIAGNOSIS — N179 Acute kidney failure, unspecified: Secondary | ICD-10-CM

## 2014-10-29 DIAGNOSIS — L03115 Cellulitis of right lower limb: Secondary | ICD-10-CM | POA: Insufficient documentation

## 2014-10-29 DIAGNOSIS — S82402A Unspecified fracture of shaft of left fibula, initial encounter for closed fracture: Secondary | ICD-10-CM | POA: Diagnosis present

## 2014-10-29 DIAGNOSIS — N189 Chronic kidney disease, unspecified: Secondary | ICD-10-CM

## 2014-10-29 LAB — CBC
HCT: 30.6 % — ABNORMAL LOW (ref 39.0–52.0)
Hemoglobin: 9.7 g/dL — ABNORMAL LOW (ref 13.0–17.0)
MCH: 28.9 pg (ref 26.0–34.0)
MCHC: 31.7 g/dL (ref 30.0–36.0)
MCV: 91.1 fL (ref 78.0–100.0)
Platelets: 137 10*3/uL — ABNORMAL LOW (ref 150–400)
RBC: 3.36 MIL/uL — ABNORMAL LOW (ref 4.22–5.81)
RDW: 14.3 % (ref 11.5–15.5)
WBC: 5.9 10*3/uL (ref 4.0–10.5)

## 2014-10-29 LAB — BASIC METABOLIC PANEL
Anion gap: 7 (ref 5–15)
BUN: 34 mg/dL — ABNORMAL HIGH (ref 6–20)
CO2: 23 mmol/L (ref 22–32)
Calcium: 8.5 mg/dL — ABNORMAL LOW (ref 8.9–10.3)
Chloride: 108 mmol/L (ref 101–111)
Creatinine, Ser: 2.07 mg/dL — ABNORMAL HIGH (ref 0.61–1.24)
GFR calc Af Amer: 35 mL/min — ABNORMAL LOW (ref >60)
GFR calc non Af Amer: 30 mL/min — ABNORMAL LOW (ref >60)
Glucose, Bld: 134 mg/dL — ABNORMAL HIGH (ref 65–99)
Potassium: 4.2 mmol/L (ref 3.5–5.1)
Sodium: 138 mmol/L (ref 135–145)

## 2014-10-29 LAB — GLUCOSE, CAPILLARY
Glucose-Capillary: 131 mg/dL — ABNORMAL HIGH (ref 65–99)
Glucose-Capillary: 72 mg/dL (ref 65–99)
Glucose-Capillary: 194 mg/dL — ABNORMAL HIGH (ref 65–99)

## 2014-10-29 LAB — GLYCOHEMOGLOBIN, TOTAL: Hemoglobin-A1c: 11.5 % — ABNORMAL HIGH (ref 5.4–7.4)

## 2014-10-29 MED ORDER — CEPHALEXIN 500 MG PO CAPS: 500.0000 mg | ORAL_CAPSULE | Freq: Three times a day (TID) | ORAL | Status: DC

## 2014-10-29 MED ORDER — LORAZEPAM 1 MG PO TABS: 1.0000 mg | ORAL_TABLET | Freq: Four times a day (QID) | ORAL | Status: DC | PRN

## 2014-10-29 MED ORDER — HYDROCODONE-ACETAMINOPHEN 5-325 MG PO TABS: 1.0000 | ORAL_TABLET | ORAL | Status: DC | PRN

## 2014-10-29 MED ORDER — CEPHALEXIN 500 MG PO CAPS
500.0000 mg | ORAL_CAPSULE | Freq: Four times a day (QID) | ORAL | Status: DC
  Administered 2014-10-29 (×2): 500 mg via ORAL
  Filled 2014-10-29 (×5): qty 1

## 2014-10-29 MED ORDER — TORSEMIDE 20 MG PO TABS: 40.0000 mg | ORAL_TABLET | Freq: Every day | ORAL | Status: DC

## 2014-10-29 MED ORDER — LISINOPRIL 10 MG PO TABS: 10.0000 mg | ORAL_TABLET | Freq: Every day | ORAL | Status: DC

## 2014-10-29 NOTE — Care Management Note (Addendum)
Case Management Note  Patient Details  Name: Stevens Puentes MRN: [id number] Date of Birth: 1941-04-04  Subjective/Objective:         74 yo admitted with open toe wound           Action/Plan: From home with wife  Expected Discharge Date:                  Expected Discharge Plan:  [place]  In-House Referral:     Discharge planning Services  CM Consult  Post Acute Care Choice:  Home Health Choice offered to:  Patient, Spouse  DME Arranged:  Tub bench, Walker rolling, Wheelchair manual DME Agency:  [place]:  RN, PT, OT, Nurse's Aide [place] Agency:  [place]  Status of Service:  In process, will continue to follow  Medicare Important Message Given:  Yes Date Medicare IM Given:  10/29/14 Medicare IM give by:  Marney Doctor RN,BSN,NCM Date Additional Medicare IM Given:    Additional Medicare Important Message give by:     If discussed at [place] of Stay Meetings, dates discussed:    Additional Comments: PT is recommending HHPT for pt at DC. This CM spoke to pt and wife at bedside about DC plan. Pt and wife decline ambulance transport and state that their son will help him in the house. PT called to answer pt questions about walker for home. MD orders placed in Epic for HHPT/OT/RN/Aide RW, light weight wheelchair with L elevated foot rest and tub bench. Pt and wife offered choice for [facility]. [facility] chosen. [facility] HH rep called to give referral. [place] DME rep also called to give referral for DME ordered. Wife states they are ok with paying the extra out of pocket cost for the tub bench and rolling walker. Pt and wife appreciative of CM assistance.  At 1355 spoke with OT who states pt would be safer with drop arm 3 in 1 vs tub bench. Spoke with pt who agrees with this. Order changed from tub bench to drop arm 3 in 1. Wheel chair and 3 in 1 to be delivered to pt's house this evening and pt states he can get into his home with the  help of his son without the wheelchair. Pt encouraged to wait at hospital and have wheelchair delivered to the hospital for safety but pt insistent that he is ready to go home and wants it delivered to home. [place] DME rep informed of changes.    Lynnell Catalan, RN 10/29/2014, 1:32 PM

## 2014-10-29 NOTE — Progress Notes (Signed)
Pt wearing nocturnal CPAP.  Pt using home equipment.  Pt machine plugged into red outlet and humidifier filled with sterile water.  Pt resting comfortably and stable.

## 2014-10-29 NOTE — Evaluation (Signed)
Occupational Therapy Evaluation Patient Details Name: David [name] MRN: [id number] DOB: 1940/11/06 Today's Date: 10/29/2014    History of Present Illness 73 yo male admitted with R great toe cellulitis. Sustained L distal fibula fx during hospital stay. Hx of DM, HTn, obesity, neuropathy.    Clinical Impression   Pt admitted with R great toe cellulitis. OT eval complete. DME ordered. Pt will benefit form HHOT to ensure pt is able to maintain TDWB with ADL activity   Follow Up Recommendations  Home health OT    Equipment Recommendations  Other (comment) (drop arm)    Recommendations for Other Services       Precautions / Restrictions Precautions Precautions: Fall Required Braces or Orthoses: Other Brace/Splint Other Brace/Splint: L CAM boot Restrictions Weight Bearing Restrictions: Yes LLE Weight Bearing: Touchdown weight bearing Other Position/Activity Restrictions: with CAM boot      Mobility Bed Mobility Overal bed mobility: Independent                Transfers Overall transfer level: Needs assistance Equipment used: Rolling walker (2 wheeled) Transfers: Sit to/from Stand Sit to Stand: Min assist;From elevated surface         General transfer comment: Pt did maintain TDWB with max VC from bed.      Balance                                            ADL Overall ADL's : Needs assistance/impaired                                       General ADL Comments: Pts wife will  A as needed.  WEnt over in details TDWB with tub transfer and toilet transfer. Tub bench ordered as well as drop arm 3 n 1.   HHOT will A with education in the home.         Extremity/Trunk Assessment Upper Extremity Assessment Upper Extremity Assessment: Overall WFL for tasks assessed           Communication Communication Communication: No difficulties   Cognition Arousal/Alertness: Awake/alert Behavior During Therapy: WFL for tasks  assessed/performed Overall Cognitive Status: Within Functional Limits for tasks assessed                     General Comments       Exercises       Shoulder Instructions      Home Living Family/patient expects to be discharged to:: Private residence Living Arrangements: Spouse/significant other Available Help at Discharge: Family Type of Home: House Home Access: Stairs to enter [employer] of Steps: incline walkway then 1 8" step Entrance Stairs-Rails: None Home Layout: One level     Bathroom Shower/Tub: Teacher, early years/pre: Handicapped height     Home Equipment: None          Prior Functioning/Environment Level of Independence: Independent             OT Diagnosis: Generalized weakness   OT Problem List: Decreased strength   OT Treatment/Interventions:      OT Goals(Current goals can be found in the care plan section) Acute Rehab OT Goals Patient Stated Goal: home. regain independence and mobility  OT Frequency:     Barriers to D/C:  Co-evaluation              End of Session Equipment Utilized During Treatment: Surveyor, mining Communication: Mobility status  Activity Tolerance: Patient tolerated treatment well Patient left: in bed;with call bell/phone within reach   Time: [id number] OT Time Calculation (min): 29 min Charges:  OT General Charges $OT Visit: 1 Procedure OT Evaluation $Initial OT Evaluation Tier I: 1 Procedure OT Treatments $Self Care/Home Management : 8-22 mins G-Codes:    Payton Mccallum D 11/25/2014, 2:46 PM

## 2014-10-29 NOTE — Discharge Summary (Signed)
Physician Discharge Summary  David [name] [name] DOB: 02-Oct-1940 DOA: 10/25/2014  PCP: [name], FNP  Admit date: 10/25/2014 Discharge date: 10/29/2014  Time spent: 65 minutes  Recommendations for Outpatient Follow-up:  1. Follow-up with [name], FNP in 1 week. On follow-up patient will need a basic metabolic profile done to follow-up on electrolytes and renal function. Patient's right lower extremity cellulitis and great toe cellulitis will need to be reassessed. 2. Follow-up with Dr. Lennette Bihari supple of orthopedics in 1 week for left fibular fracture. Patient may need repeat x-rays to rule out displacement.  Discharge Diagnoses:  Principal Problem:   Cellulitis of great toe of right foot Active Problems:   DM (diabetes mellitus), type 2 with complications   Essential hypertension   Renal insufficiency   Cardiovascular disease   Open toe wound   OSA on CPAP   Ankle fracture, left   Closed left fibular fracture   Cellulitis of right foot   Renal failure (ARF), acute on chronic   Discharge Condition: Stable and improved  Diet recommendation: carb modified  Filed Weights   10/25/14 2211 10/26/14 0620 10/27/14 0439  Weight: 122.471 kg (270 lb) 129 kg (284 lb 6.3 oz) 132.5 kg (292 lb 1.8 oz)    History of present illness:  Per Dr. Leilani Merl is a 74 y.o. male  With diabetes on an insulin pump, hypertension, coronary artery disease, active sleep apnea, obesity, stage III chronic kidney disease. Patient had been dealing with a diabetic wound ulcer on the plantar surface of his right great toe over the past 3 months. He's been seeing a podiatrist who had been managing this. Over the past 48 hours, this toes become red and warm to touch. Additionally, he woke on the morning of admission, with a fever and generalized malaise. His appetite has been decreased. No provoking or palliating factors.  He denied any recent antibiotic use. No hospitalizations in  the past 90 days.   Hospital Course:  #1 right great toe infection/right lower extremity cellulitis Patient was admitted with a right great toe infection and right lower extremity cellulitis and placed empirically on IV vancomycin and IV Zosyn on admission. IV vancomycin was discontinued and patient was continued on IV Zosyn. Wound culture which was done did grab abundant pseudomonas aeruginosa and clear whether this was a right great toe infection as there was no discharge or erythema in the toe itself and concern that wound culture was just a colonizer patient versus severe right lower extremity cellulitis involving the ankle. Patient was maintained on IV antibiotics. Plain films were obtained which were consistent with a cellulitis. Blood cultures had no growth to date. 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 Patient can get any IV contrast secondary to his chronic kidney disease. Three-phase nuclear scan/bone scan showed evidence of soft tissue inflammation but not convincing for osteomyelitis. Dr.Elgergawy discussed wound culture findings and clinical scenario with Dr. Megan Salon of infectious diseases and he was recommended that patient be transitioned to oral Keflex on discharge. Patient improved clinically and patient will be discharged on 4 more days of oral Keflex to complete a one-week course of antibiotics therapy. Patient will follow-up with PCP as outpatient.  #2 left fibular fracture/left ankle fracture Secondary to fall sustained during patient's hospitalization. Orthopedic was consulted who looked at films and felt conservative management with touchdown weightbearing with a Cam Walker was indicated at this time. Patient was seen by physical therapy. It was recommended  that patient follow-up with Dr. Lennette Bihari supple as outpatient for repeat x-rays in 1 week to make sure there is no displacement. Patient be discharged in stable and improved condition.  #3 acute on chronic  kidney disease stage III Patient was admitted with acute on chronic kidney disease stage III with creatinine going up as high as 2.44 during the hospitalization. Was felt this was likely a prerenal azotemia. Patient was hydrated gently with IV fluids with improvement in his renal function such that by day of discharge patient's creatinine was down to 2.07. Patient's ACE inhibitor and diuretics were held. Patient will follow-up with PCP as outpatient.  #4 diabetes mellitus Patient was maintained on his insulin pump. Hemoglobin A1c was 8.7. Outpatient follow-up.  #5 hypertension  remained stable.   #6 history of coronary artery disease Remained stable. Patient was maintained on his home regimen of aspirin, beta blockers,imdur.  #7 obstructive sleep apnea Patient was placed on CPAP machine at bedtime.  Procedures:  X-ray of the right foot 10/26/2014  X-ray of the left ankle 10/27/2014  X-ray of the right ankle 10/28/2014  Bone scan right foot 10/27/2014  Consultations:  Orthopedics: Jenetta Loges, PA 10/27/2014  Discharge Exam: Filed Vitals:   10/29/14 0432  BP: 128/54  Pulse: 68  Temp: 98.5 F (36.9 C)  Resp: 16    General: NAD Cardiovascular: RRR Respiratory: CTAB Extremities: Left lower extremity in CAM boot. Right lower extremity with chronic venous stasis changes with decreased erythema, nontender to palpation. Right great toe with some erythema and less tender to palpation.  Discharge Instructions   Discharge Instructions    Diet Carb Modified    Complete by:  As directed      Discharge instructions    Complete by:  As directed   TDWB on LLE. Follow up with [name], FNP in 1 week. Follow up with Dr Onnie Graham, orthopedics in 1 week.     Increase activity slowly    Complete by:  As directed           Current Discharge Medication List    START taking these medications   Details  cephALEXin (KEFLEX) 500 MG capsule Take 1 capsule (500 mg total) by  mouth 3 (three) times daily. Take for 4 days then stop. Qty: 12 capsule, Refills: 0    HYDROcodone-acetaminophen (NORCO/VICODIN) 5-325 MG per tablet Take 1 tablet by mouth every 4 (four) hours as needed for moderate pain. Qty: 20 tablet, Refills: 0    LORazepam (ATIVAN) 1 MG tablet Take 1 tablet (1 mg total) by mouth every 6 (six) hours as needed for anxiety. Qty: 15 tablet, Refills: 0      CONTINUE these medications which have CHANGED   Details  lisinopril (PRINIVIL,ZESTRIL) 10 MG tablet Take 1 tablet (10 mg total) by mouth daily. Resume in 2-3 days. Qty: 1 tablet, Refills: 0    torsemide (DEMADEX) 20 MG tablet Take 2 tablets (40 mg total) by mouth daily. Resume in 2-3 days. Qty: 60 tablet, Refills: 0      CONTINUE these medications which have NOT CHANGED   Details  acetaminophen (TYLENOL) 500 MG tablet Take 1,000 mg by mouth every 6 (six) hours as needed for fever.    amLODipine (NORVASC) 5 MG tablet Take 1 tablet (5 mg total) by mouth daily. Qty: 180 tablet, Refills: 2   Associated Diagnoses: Sinus node dysfunction    aspirin 81 MG tablet Take 81 mg by mouth daily.    calcitRIOL (ROCALTROL) 0.25 MCG  capsule Take 0.25 mcg by mouth daily.     carvedilol (COREG) 25 MG tablet Take 1 tablet (25 mg total) by mouth 2 (two) times daily with a meal. Qty: 90 tablet, Refills: 1    doxazosin (CARDURA) 4 MG tablet Take 1 tablet (4 mg total) by mouth daily. Qty: 90 tablet, Refills: 1    insulin aspart (NOVOLOG) 100 UNIT/ML injection Use max 120 units per day with insulin pump. Qty: 40 mL, Refills: 5    isosorbide mononitrate (IMDUR) 30 MG 24 hr tablet TAKE TWO TABLETS BY MOUTH ONCE DAILY Qty: 60 tablet, Refills: 0    Liraglutide (VICTOZA) 18 MG/3ML SOPN Inject 1.8 mg into the skin daily. Inject once daily at the same time Qty: 3 pen, Refills: 3    mupirocin cream (BACTROBAN) 2 % Apply 1 application topically 2 (two) times daily. Applied to the toe    Omega-3 Fatty Acids (FISH  OIL) 1000 MG CAPS Take by mouth 2 (two) times daily.    pantoprazole (PROTONIX) 40 MG tablet Take 1 tablet (40 mg total) by mouth daily. Qty: 90 tablet, Refills: 1    B-D ULTRAFINE III SHORT PEN 31G X 8 MM MISC     ciprofloxacin-dexamethasone (CIPRODEX) otic suspension Place 4 drops into the left ear 2 (two) times daily. Qty: 7.5 mL, Refills: 0    glucose blood (BAYER CONTOUR NEXT TEST) test strip Use as instructed to check blood sugar 6 times per day dx code E11.69, patient is on insulin pump and is medically required to check 6 times per day. Qty: 200 each, Refills: 3    neomycin-polymyxin-hydrocortisone (CORTISPORIN) otic solution Place 3 drops into the left ear 4 (four) times daily. Qty: 10 mL, Refills: 0       No Known Allergies Follow-up Information    Follow up with [name], FNP. Schedule an appointment as soon as possible for a visit in 1 week.   Specialty:  Family Medicine   Contact information:   [place] [address] [phone]       Follow up with Marin Shutter, MD. Schedule an appointment as soon as possible for a visit in 1 week.   Specialty:  Orthopedic Surgery   Why:  f/u left ankle fracture   Contact information:   [address] [place] [address] [phone]       Follow up with [name], [place].   Specialty:  Family Medicine   Why:  Pt has a f/u appt with Dr. Elna Breslow on Tuesday June 21 at 11:00 am.    Contact information:   [place] [address] [phone]       Follow up with Marin Shutter, MD.   Specialty:  Orthopedic Surgery   Why:  Pt has a f/u appt with Dr.Supple on Monday June 20 at 9:15 am.   Contact information:   [address] [name] [address] [phone]        The results of significant diagnostics from this hospitalization (including imaging, microbiology, ancillary and laboratory) are listed below for reference.    Significant Diagnostic  Studies: Dg Knee 1-2 Views Left  November 12, 2014   CLINICAL DATA:  Status post fall 10/24/2004 with the distal left fibula fracture. Medial left knee pain today with swelling. Initial encounter.  EXAM: LEFT KNEE - 1-2 VIEW  COMPARISON:  None.  FINDINGS: The patient has a small joint effusion. No fracture is identified. Moderate to advanced degenerative disease  about the knee appears worst in the medial compartment. Atherosclerosis is noted.  IMPRESSION: Negative for fracture.  Small joint effusion.  Osteoarthritis.   Electronically Signed   By: Inge Rise [name]   On: 10/27/2014 21:00   Dg Ankle Complete Left  10/27/2014   CLINICAL DATA:  Status post fall; left ankle pain. Initial encounter.  EXAM: LEFT ANKLE COMPLETE - 3+ VIEW  COMPARISON:  None.  FINDINGS: There is a mildly displaced oblique fracture across the distal fibular diaphysis, with mild posterior displacement. The interosseous space appears grossly intact. No definite ankle mortise widening is seen at this time.  No definite additional fractures are seen. A small osseous fragment superior to the posterior talus may reflect remote injury. There is suggestion of an osteochondral defect at the medial aspect of the talar dome.  Soft tissue swelling is noted about the ankle. Diffuse vascular calcifications are seen.  IMPRESSION: 1. Mildly displaced oblique fracture across the distal fibular diaphysis, with mild posterior displacement. 2. Suggestion of osteochondral defect at the medial aspect of the talar dome. 3. Small osseous fragment superior to the posterior talus may reflect remote injury. 4. Diffuse vascular calcifications seen.   Electronically Signed   By: Garald Balding [name]   On: 10/27/2014 00:59   Dg Ankle Complete Right  10/28/2014   CLINICAL DATA:  Right ankle pain, erythema, swelling  EXAM: RIGHT ANKLE - COMPLETE 3+ VIEW  COMPARISON:  10/26/1998 16 foot radiographs  FINDINGS: There is no evidence of fracture, dislocation, or joint  effusion. There is no evidence of arthropathy or other focal bone abnormality. Mild diffuse soft tissue edema is noted with vascular calcifications. Plantar calcaneal spurring noted.  IMPRESSION: Minimal diffuse soft tissue edema.   Electronically Signed   By: Conchita Paris [name]   On: 10/28/2014 10:03   Dg Foot Complete Right  10/26/2014   CLINICAL DATA:  Uncontrolled diabetes, hypertension. Warmth and erythema to the great toe, new today.  EXAM: RIGHT FOOT COMPLETE - 3+ VIEW  COMPARISON:  None.  FINDINGS: There is prior amputation of the second digit at the MTP joint. There is amputation of the third distal phalanx. There is severe joint space narrowing and osteophyte formation about the first MTP joint with prominent sclerosis. No bony destruction is evident.  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 No acute findings are evident. Prior amputations as detailed above.   Electronically Signed   By: Andreas Newport [name]   On: 10/26/2014 02:01   Nm Bone Scan 3 Phase Lower Extremity  10/27/2014   CLINICAL DATA:  Diabetic ulcer. Great toe infection. Query cellulitis versus osteomyelitis.  EXAM: NUCLEAR MEDICINE 3-PHASE BONE SCAN  TECHNIQUE: Radionuclide angiographic images, immediate static blood pool images, and 3-hour delayed static images were obtained of the both feet after intravenous injection of radiopharmaceutical.  RADIOPHARMACEUTICALS:  25 Technetium-80m MDP IV  COMPARISON:  Radiographs from 10/27/2014 and 10/26/2014  FINDINGS: Prior right second toe amputation and prior amputation of the distal phalanx of the third toe.  Vascular phase: Flow images demonstrate vaguely increased uptake in the vicinity of the right MCP joints and right great toe. There is also some generalized increased soft tissue uptake in the right foot and ankle.  Blood pool phase: Blood pool images demonstrate a greater degree of activity along the right first MTP joint and right great toe, along with  faintly increased uptake in the ankles.  Delayed phase: Focally high activity in the first MCP joint, with  foci of high activity along the malleoli.  IMPRESSION: 1. High activity along the first MCP joint on all 3 phases, but on conventional radiographs there is prominent sclerosis, spurring, and severe loss of articular space in this region which appears degenerated and which could explain inflammation and high activity. There is likely some degree of soft tissue inflammation in the great toe given the diffuse subcutaneous activity on blood pool and flow phase images in this vicinity, and the possibility of cellulitis is certainly raised. I am doubtful of true osteomyelitis. 2. Vaguely increased activity along both the medial and lateral malleoli bilaterally. The patient has a known healing left lateral malleolar fracture. It may be prudent to image the right ankle as well, given the somewhat similar high activity along the right malleoli.   Electronically Signed   By: Van Clines [name]   On: 10/27/2014 19:47    Microbiology: Recent Results (from the past 240 hour(s))  Urine culture     Status: None   Collection Time: 10/25/14 10:41 PM  Result Value Ref Range Status   Specimen Description URINE, CLEAN CATCH  Final   Special Requests NONE  Final   Colony Count NO GROWTH Performed at [facility]   Final   Culture NO GROWTH Performed at [facility]   Final   Report Status 10/26/2014 FINAL  Final  Blood culture (routine x 2)     Status: None (Preliminary result)   Collection Time: 10/25/14 10:47 PM  Result Value Ref Range Status   Specimen Description BLOOD RIGHT HAND  Final   Special Requests BOTTLES DRAWN AEROBIC AND ANAEROBIC  Final   Culture   Final           BLOOD CULTURE RECEIVED NO GROWTH TO DATE CULTURE WILL BE HELD FOR 5 DAYS BEFORE ISSUING A FINAL NEGATIVE REPORT Note: Culture results may be compromised due to an inadequate volume of blood received in culture  bottles. Performed at [facility]    Report Status PENDING  Incomplete  Blood culture (routine x 2)     Status: None (Preliminary result)   Collection Time: 10/25/14 10:47 PM  Result Value Ref Range Status   Specimen Description BLOOD LEFT HAND  Final   Special Requests BOTTLES DRAWN AEROBIC AND ANAEROBIC  Final   Culture   Final           BLOOD CULTURE RECEIVED NO GROWTH TO DATE CULTURE WILL BE HELD FOR 5 DAYS BEFORE ISSUING A FINAL NEGATIVE REPORT Performed at [facility]    Report Status PENDING  Incomplete  Wound culture     Status: None   Collection Time: 10/26/14 12:32 AM  Result Value Ref Range Status   Specimen Description TOE RIGHT  Final   Special Requests Normal  Final   Gram Stain   Final    NO WBC SEEN NO SQUAMOUS EPITHELIAL CELLS SEEN RARE GRAM NEGATIVE RODS Performed at [facility]    Culture   Final    ABUNDANT PSEUDOMONAS AERUGINOSA Performed at [facility]    Report Status 10/28/2014 FINAL  Final   Organism ID, Bacteria PSEUDOMONAS AERUGINOSA  Final      Susceptibility   Pseudomonas aeruginosa - MIC*    CEFEPIME <=1 SENSITIVE Sensitive     CEFTAZIDIME 2 SENSITIVE Sensitive     CIPROFLOXACIN <=0.25 SENSITIVE Sensitive     GENTAMICIN <=1 SENSITIVE Sensitive     IMIPENEM 2 SENSITIVE Sensitive  PIP/TAZO 8 SENSITIVE Sensitive     TOBRAMYCIN <=1 SENSITIVE Sensitive     * ABUNDANT PSEUDOMONAS AERUGINOSA     Labs: Basic Metabolic Panel:  Recent Labs Lab 10/25/14 2247 10/27/14 0410 10/28/14 0413 10/29/14 0413  NA 137 139 135 138  K 5.1 4.4 4.3 4.2  CL 105 107 106 108  CO2 23 23 23 23   GLUCOSE 133* 131* 177* 134*  BUN 39* 45* 38* 34*  CREATININE 1.86* 2.44* 2.07* 2.07*  CALCIUM 9.2 8.5* 8.2* 8.5*   Liver Function Tests: No results for input(s): AST, ALT, ALKPHOS, BILITOT, PROT, ALBUMIN in the last 168 hours. No results for input(s): LIPASE, AMYLASE in the last 168 hours. No results for input(s): AMMONIA in  the last 168 hours. CBC:  Recent Labs Lab 10/25/14 2247 10/26/14 0436 10/28/14 0413 10/29/14 0413  WBC 10.8* 9.1 6.2 5.9  NEUTROABS 9.0*  --   --   --   HGB 12.5* 11.5* 10.8* 9.7*  HCT 38.3* 35.4* 33.0* 30.6*  MCV 92.7 92.7 92.7 91.1  PLT 143* 131* 123* 137*   Cardiac Enzymes: No results for input(s): CKTOTAL, CKMB, CKMBINDEX, TROPONINI in the last 168 hours. BNP: BNP (last 3 results) No results for input(s): BNP in the last 8760 hours.  ProBNP (last 3 results) No results for input(s): PROBNP in the last 8760 hours.  CBG:  Recent Labs Lab 10/28/14 1656 10/28/14 2142 10/29/14 0149 10/29/14 0656 10/29/14 1135  GLUCAP 95 138* 131* 72 194*       Signed:  Aneudy Champlain MD [facility] 10/29/2014, 4:46 PM

## 2014-10-29 NOTE — Progress Notes (Signed)
PT NOTE  CM contacted therapist about pt having questions about walker. Visited briefly with pt to answer questions. Pt stated he would like to have RW for home instead of SW. Discussed mobility at home again, encouraged pt to perform scoot transfers, instead of stand pivot since standing is still so difficult, especially when mobilizing with family. Pt/wife report they have practiced scooting some during stay. Pt agreeable to this.  Weston Anna, MPT  [phone]

## 2014-10-30 ENCOUNTER — Telehealth: Payer: Self-pay | Admitting: *Deleted

## 2014-10-30 ENCOUNTER — Telehealth: Payer: Self-pay | Admitting: Family

## 2014-10-30 DIAGNOSIS — E1165 Type 2 diabetes mellitus with hyperglycemia: Secondary | ICD-10-CM | POA: Diagnosis not present

## 2014-10-30 DIAGNOSIS — G4733 Obstructive sleep apnea (adult) (pediatric): Secondary | ICD-10-CM | POA: Diagnosis not present

## 2014-10-30 DIAGNOSIS — S82892D Other fracture of left lower leg, subsequent encounter for closed fracture with routine healing: Secondary | ICD-10-CM | POA: Diagnosis not present

## 2014-10-30 DIAGNOSIS — L03115 Cellulitis of right lower limb: Secondary | ICD-10-CM | POA: Diagnosis not present

## 2014-10-30 DIAGNOSIS — N179 Acute kidney failure, unspecified: Secondary | ICD-10-CM | POA: Diagnosis not present

## 2014-10-30 DIAGNOSIS — S96902D Unspecified injury of unspecified muscle and tendon at ankle and foot level, left foot, subsequent encounter: Secondary | ICD-10-CM | POA: Diagnosis not present

## 2014-10-30 DIAGNOSIS — N183 Chronic kidney disease, stage 3 (moderate): Secondary | ICD-10-CM | POA: Diagnosis not present

## 2014-10-30 DIAGNOSIS — S96891D Other specified injury of other specified muscles and tendons at ankle and foot level, right foot, subsequent encounter: Secondary | ICD-10-CM | POA: Diagnosis not present

## 2014-10-30 DIAGNOSIS — Z4800 Encounter for change or removal of nonsurgical wound dressing: Secondary | ICD-10-CM | POA: Diagnosis not present

## 2014-10-30 DIAGNOSIS — S96901D Unspecified injury of unspecified muscle and tendon at ankle and foot level, right foot, subsequent encounter: Secondary | ICD-10-CM | POA: Diagnosis not present

## 2014-10-30 DIAGNOSIS — Z794 Long term (current) use of insulin: Secondary | ICD-10-CM | POA: Diagnosis not present

## 2014-10-30 DIAGNOSIS — I10 Essential (primary) hypertension: Secondary | ICD-10-CM | POA: Diagnosis not present

## 2014-10-30 DIAGNOSIS — S82402D Unspecified fracture of shaft of left fibula, subsequent encounter for closed fracture with routine healing: Secondary | ICD-10-CM | POA: Diagnosis not present

## 2014-10-30 NOTE — Telephone Encounter (Signed)
Transition Care Management Follow-up Telephone Call D/C 10/29/14  How have you been since you were released from the hospital? Pt states he is doing alright   Do you understand why you were in the hospital? YES}   Do you understand the discharge instrcutions? YES  Items Reviewed:  Medications reviewed: YES  Allergies reviewed: YES  Dietary changes reviewed: YES, carb modified  Referrals reviewed: Will f/u with orthopedic Dr. Onnie Graham 11/03/14   Functional Questionnaire:   Activities of Daily Living (ADLs):   He states he are independent in the following: ambulation, bathing and hygiene, feeding, continence, grooming, toileting and dressing States he require assistance with the following: ambulation   Any transportation issues/concerns?: NO   Any patient concerns? NO   Confirmed importance and date/time of follow-up visits scheduled: YES, appt made 11/04/14 w/Grrg Calone   Confirmed with patient if condition begins to worsen call PCP or go to the ER.

## 2014-10-30 NOTE — Telephone Encounter (Signed)
Pt is being admitted to home health [facility] [phone]  Need Verbal orders to treat wound on pt Toes

## 2014-10-31 NOTE — Telephone Encounter (Signed)
Called back and gave them the verbal ok.

## 2014-11-01 DIAGNOSIS — S96901D Unspecified injury of unspecified muscle and tendon at ankle and foot level, right foot, subsequent encounter: Secondary | ICD-10-CM | POA: Diagnosis not present

## 2014-11-01 DIAGNOSIS — S82402D Unspecified fracture of shaft of left fibula, subsequent encounter for closed fracture with routine healing: Secondary | ICD-10-CM | POA: Diagnosis not present

## 2014-11-01 DIAGNOSIS — L03115 Cellulitis of right lower limb: Secondary | ICD-10-CM | POA: Diagnosis not present

## 2014-11-01 DIAGNOSIS — S96891D Other specified injury of other specified muscles and tendons at ankle and foot level, right foot, subsequent encounter: Secondary | ICD-10-CM | POA: Diagnosis not present

## 2014-11-01 DIAGNOSIS — S82892D Other fracture of left lower leg, subsequent encounter for closed fracture with routine healing: Secondary | ICD-10-CM | POA: Diagnosis not present

## 2014-11-01 DIAGNOSIS — S96902D Unspecified injury of unspecified muscle and tendon at ankle and foot level, left foot, subsequent encounter: Secondary | ICD-10-CM | POA: Diagnosis not present

## 2014-11-01 LAB — CULTURE, BLOOD (ROUTINE X 2)
Culture: NO GROWTH
Culture: NO GROWTH

## 2014-11-03 ENCOUNTER — Other Ambulatory Visit: Payer: Self-pay | Admitting: Orthopedic Surgery

## 2014-11-04 ENCOUNTER — Encounter (HOSPITAL_BASED_OUTPATIENT_CLINIC_OR_DEPARTMENT_OTHER): Payer: Self-pay | Admitting: *Deleted

## 2014-11-04 ENCOUNTER — Encounter: Payer: Self-pay | Admitting: Family

## 2014-11-04 ENCOUNTER — Other Ambulatory Visit: Payer: Self-pay | Admitting: Family

## 2014-11-04 ENCOUNTER — Ambulatory Visit (INDEPENDENT_AMBULATORY_CARE_PROVIDER_SITE_OTHER): Payer: Medicare Other | Admitting: Family

## 2014-11-04 ENCOUNTER — Other Ambulatory Visit (INDEPENDENT_AMBULATORY_CARE_PROVIDER_SITE_OTHER): Payer: Medicare Other

## 2014-11-04 VITALS — BP 92/42 | HR 60 | Resp 18 | Ht 76.0 in

## 2014-11-04 DIAGNOSIS — S82402D Unspecified fracture of shaft of left fibula, subsequent encounter for closed fracture with routine healing: Secondary | ICD-10-CM

## 2014-11-04 DIAGNOSIS — S96901D Unspecified injury of unspecified muscle and tendon at ankle and foot level, right foot, subsequent encounter: Secondary | ICD-10-CM | POA: Diagnosis not present

## 2014-11-04 DIAGNOSIS — L03115 Cellulitis of right lower limb: Secondary | ICD-10-CM

## 2014-11-04 DIAGNOSIS — S82892D Other fracture of left lower leg, subsequent encounter for closed fracture with routine healing: Secondary | ICD-10-CM | POA: Diagnosis not present

## 2014-11-04 DIAGNOSIS — S96902D Unspecified injury of unspecified muscle and tendon at ankle and foot level, left foot, subsequent encounter: Secondary | ICD-10-CM | POA: Diagnosis not present

## 2014-11-04 DIAGNOSIS — S96891D Other specified injury of other specified muscles and tendons at ankle and foot level, right foot, subsequent encounter: Secondary | ICD-10-CM | POA: Diagnosis not present

## 2014-11-04 LAB — BASIC METABOLIC PANEL
BUN: 66 mg/dL — ABNORMAL HIGH (ref 6–23)
CO2: 26 mEq/L (ref 19–32)
Calcium: 9.4 mg/dL (ref 8.4–10.5)
Chloride: 101 mEq/L (ref 96–112)
Creatinine, Ser: 2.14 mg/dL — ABNORMAL HIGH (ref 0.40–1.50)
GFR: 32.25 mL/min — ABNORMAL LOW (ref >60.00)
Glucose, Bld: 102 mg/dL — ABNORMAL HIGH (ref 70–99)
Potassium: 4.9 mEq/L (ref 3.5–5.1)
Sodium: 135 mEq/L (ref 135–145)

## 2014-11-04 MED ORDER — DOXAZOSIN MESYLATE 4 MG PO TABS: 4.0000 mg | ORAL_TABLET | Freq: Every day | ORAL | Status: DC

## 2014-11-04 NOTE — Addendum Note (Signed)
Addended by: [name] D on: 11/04/2014 10:03 PM   Modules accepted: Level of Service

## 2014-11-04 NOTE — Progress Notes (Signed)
Pre visit review using our clinic review tool, if applicable. No additional management support is needed unless otherwise documented below in the visit note. 

## 2014-11-04 NOTE — Assessment & Plan Note (Signed)
Stable with current treatment in Cam Walker and will follow up with Dr. Doran Durand for surgery on Thursday. Follow-up primary care as needed.

## 2014-11-04 NOTE — Progress Notes (Signed)
Subjective:    Patient ID: David [name], male    DOB: Aug 04, 1940, 74 y.o.   MRN: [id number]  Chief Complaint  Patient presents with  . Hospitalization Follow-up    Was in the hospital with high fever and redness and swelling of the left leg    HPI:  David [name] is a 74 y.o. male with a PMH of type 2 diabetes, essential hypertension, renal insufficiency, hyperlipidemia, diabetic ulcer, and obstructive sleep apnea who presents today in office visit following hospitalization.   Recently admitted to the hospital following an ED visit where he was found to have redness and warmth of his right great toe. He also was experiencing generalized malaise and fever. Blood cultures revealed no growth and all other exams were consistent with cellulitis. He was treated with IV vancomycin and Zosyn. During his hospitalization he was attempting to use the bathroom when he experienced a fall and injuring his left ankle and fibula. X-rays revealed a left fibular fracture and was evaluated by orthopedics and determined conservative treatment. He was discharged on Keflex. All hospital records reviewed in detail. Left fibular x-rays were reviewed independently by this provider.  1.) Right leg cellulitis - He presents today with no associated symptoms of malaise, fever, redness or swelling. Overall his right foot continues to feel better. Denies pain or discharge.   2.) Left fibular fracture - received notification from orthopedics that his fracture would require surgery and he is scheduled for a procedure with Dr. Doran Durand in 2 days time. Continues to experience pain which is somewhat controlled with medication. Does report some dizziness follow taking pain medications.   No Known Allergies  Current Outpatient Prescriptions on File Prior to Visit  Medication Sig Dispense Refill  . acetaminophen (TYLENOL) 500 MG tablet Take 1,000 mg by mouth every 6 (six) hours as needed for fever.    Marland Kitchen aspirin 81 MG tablet  Take 81 mg by mouth daily.    . B-D ULTRAFINE III SHORT PEN 31G X 8 MM MISC     . calcitRIOL (ROCALTROL) 0.25 MCG capsule Take 0.25 mcg by mouth daily.     . carvedilol (COREG) 25 MG tablet Take 1 tablet (25 mg total) by mouth 2 (two) times daily with a meal. 90 tablet 1  . cephALEXin (KEFLEX) 500 MG capsule Take 1 capsule (500 mg total) by mouth 3 (three) times daily. Take for 4 days then stop. 12 capsule 0  . ciprofloxacin-dexamethasone (CIPRODEX) otic suspension Place 4 drops into the left ear 2 (two) times daily. 7.5 mL 0  . doxazosin (CARDURA) 4 MG tablet Take 1 tablet (4 mg total) by mouth daily. 90 tablet 1  . glucose blood (BAYER CONTOUR NEXT TEST) test strip Use as instructed to check blood sugar 6 times per day dx code E11.69, patient is on insulin pump and is medically required to check 6 times per day. 200 each 3  . HYDROcodone-acetaminophen (NORCO/VICODIN) 5-325 MG per tablet Take 1 tablet by mouth every 4 (four) hours as needed for moderate pain. 20 tablet 0  . insulin aspart (NOVOLOG) 100 UNIT/ML injection Use max 120 units per day with insulin pump. 40 mL 5  . isosorbide mononitrate (IMDUR) 30 MG 24 hr tablet TAKE TWO TABLETS BY MOUTH ONCE DAILY 60 tablet 0  . Liraglutide (VICTOZA) 18 MG/3ML SOPN Inject 1.8 mg into the skin daily. Inject once daily at the same time 3 pen 3  . lisinopril (PRINIVIL,ZESTRIL) 10 MG tablet Take 1 tablet (10  mg total) by mouth daily. Resume in 2-3 days. 1 tablet 0  . LORazepam (ATIVAN) 1 MG tablet Take 1 tablet (1 mg total) by mouth every 6 (six) hours as needed for anxiety. 15 tablet 0  . mupirocin cream (BACTROBAN) 2 % Apply 1 application topically 2 (two) times daily. Applied to the toe    . neomycin-polymyxin-hydrocortisone (CORTISPORIN) otic solution Place 3 drops into the left ear 4 (four) times daily. 10 mL 0  . Omega-3 Fatty Acids (FISH OIL) 1000 MG CAPS Take by mouth 2 (two) times daily.    . pantoprazole (PROTONIX) 40 MG tablet Take 1 tablet (40  mg total) by mouth daily. 90 tablet 1  . torsemide (DEMADEX) 20 MG tablet Take 2 tablets (40 mg total) by mouth daily. Resume in 2-3 days. 60 tablet 0   No current facility-administered medications on file prior to visit.    Review of Systems  Constitutional: Negative for fever and chills.  Cardiovascular: Negative for chest pain, palpitations and leg swelling.  Musculoskeletal: Positive for joint swelling.  Neurological: Negative for weakness.      Objective:    BP 92/42 mmHg  Pulse 60  Resp 18  Ht 6\' 4"  (1.93 m)  SpO2 93% Nursing note and vital signs reviewed.  Physical Exam  Constitutional: He is oriented to person, place, and time. He appears well-developed and well-nourished. No distress.  Seated in a wheelchair with a cam camwalker boot, dressed appropriately for the situation and appears his stated age.   Cardiovascular: Normal rate, regular rhythm, normal heart sounds and intact distal pulses.   Pulmonary/Chest: Effort normal and breath sounds normal.  Neurological: He is alert and oriented to person, place, and time.  Skin: Skin is warm and dry.  0.5 annular wound with no discharge or signs of inflammation noted on bottom of right great toe.   Psychiatric: He has a normal mood and affect. His behavior is normal. Judgment and thought content normal.       Assessment & Plan:   Problem List Items Addressed This Visit      Musculoskeletal and Integument   Closed left fibular fracture    Stable with current treatment in [place] and will follow up with Dr. Doran Durand for surgery on Thursday. Follow-up primary care as needed.        Other   Cellulitis of right foot - Primary    Has completed Keflex and no evidence of residual infection of right great toe noted. Continue to monitor at this time and observe for any future signs of infection. Does experience some hypotensive readings on occasion. Hold amlodipine. Restart if blood pressures improve following treatments.        Relevant Orders   Basic Metabolic Panel (BMET)

## 2014-11-04 NOTE — Assessment & Plan Note (Addendum)
Has completed Keflex and no evidence of residual infection of right great toe noted. Continue to monitor at this time and observe for any future signs of infection. Does experience some hypotensive readings on occasion. Hold amlodipine. Restart if blood pressures improve following treatments. Obtain basic metabolic panel check kidney function.

## 2014-11-04 NOTE — Patient Instructions (Signed)
Thank you for choosing [facility].  Summary/Instructions:  Please discontinue your amlodipine.   Continue to take your other medications as prescribed.   Your prescription(s) have been submitted to your pharmacy or been printed and provided for you. Please take as directed and contact our office if you believe you are having problem(s) with the medication(s) or have any questions.   Please stop by the lab on the basement level of the building for your blood work. Your results will be released to [place] (or called to you) after review, usually within 72 hours after test completion. If any changes need to be made, you will be notified at that same time.  If your symptoms worsen or fail to improve, please contact our office for further instruction, or in case of emergency go directly to the emergency room at the closest medical facility.

## 2014-11-05 DIAGNOSIS — S96901D Unspecified injury of unspecified muscle and tendon at ankle and foot level, right foot, subsequent encounter: Secondary | ICD-10-CM | POA: Diagnosis not present

## 2014-11-05 DIAGNOSIS — S96902D Unspecified injury of unspecified muscle and tendon at ankle and foot level, left foot, subsequent encounter: Secondary | ICD-10-CM | POA: Diagnosis not present

## 2014-11-05 DIAGNOSIS — S82402D Unspecified fracture of shaft of left fibula, subsequent encounter for closed fracture with routine healing: Secondary | ICD-10-CM | POA: Diagnosis not present

## 2014-11-05 DIAGNOSIS — S96891D Other specified injury of other specified muscles and tendons at ankle and foot level, right foot, subsequent encounter: Secondary | ICD-10-CM | POA: Diagnosis not present

## 2014-11-05 DIAGNOSIS — L03115 Cellulitis of right lower limb: Secondary | ICD-10-CM | POA: Diagnosis not present

## 2014-11-05 DIAGNOSIS — S82892D Other fracture of left lower leg, subsequent encounter for closed fracture with routine healing: Secondary | ICD-10-CM | POA: Diagnosis not present

## 2014-11-06 ENCOUNTER — Encounter (HOSPITAL_BASED_OUTPATIENT_CLINIC_OR_DEPARTMENT_OTHER): Payer: Self-pay | Admitting: *Deleted

## 2014-11-06 ENCOUNTER — Ambulatory Visit (HOSPITAL_BASED_OUTPATIENT_CLINIC_OR_DEPARTMENT_OTHER): Payer: Medicare Other | Admitting: Anesthesiology

## 2014-11-06 ENCOUNTER — Ambulatory Visit (HOSPITAL_BASED_OUTPATIENT_CLINIC_OR_DEPARTMENT_OTHER)
Admission: RE | Admit: 2014-11-06 | Discharge: 2014-11-06 | Disposition: A | Discharge status: Home / Self Care | Payer: Medicare Other | Source: Ambulatory Visit | Attending: Orthopedic Surgery | Admitting: Orthopedic Surgery

## 2014-11-06 ENCOUNTER — Encounter (HOSPITAL_BASED_OUTPATIENT_CLINIC_OR_DEPARTMENT_OTHER)
Admission: RE | Disposition: A | Discharge status: Home / Self Care | Payer: Self-pay | Source: Ambulatory Visit | Attending: Orthopedic Surgery

## 2014-11-06 DIAGNOSIS — E119 Type 2 diabetes mellitus without complications: Secondary | ICD-10-CM | POA: Insufficient documentation

## 2014-11-06 DIAGNOSIS — Y999 Unspecified external cause status: Secondary | ICD-10-CM | POA: Insufficient documentation

## 2014-11-06 DIAGNOSIS — Z9641 Presence of insulin pump (external) (internal): Secondary | ICD-10-CM | POA: Insufficient documentation

## 2014-11-06 DIAGNOSIS — I2510 Atherosclerotic heart disease of native coronary artery without angina pectoris: Secondary | ICD-10-CM | POA: Insufficient documentation

## 2014-11-06 DIAGNOSIS — S82842A Displaced bimalleolar fracture of left lower leg, initial encounter for closed fracture: Secondary | ICD-10-CM | POA: Diagnosis not present

## 2014-11-06 DIAGNOSIS — M25572 Pain in left ankle and joints of left foot: Secondary | ICD-10-CM | POA: Diagnosis not present

## 2014-11-06 DIAGNOSIS — Z79899 Other long term (current) drug therapy: Secondary | ICD-10-CM | POA: Insufficient documentation

## 2014-11-06 DIAGNOSIS — Z7982 Long term (current) use of aspirin: Secondary | ICD-10-CM | POA: Insufficient documentation

## 2014-11-06 DIAGNOSIS — N19 Unspecified kidney failure: Secondary | ICD-10-CM | POA: Insufficient documentation

## 2014-11-06 DIAGNOSIS — Z85828 Personal history of other malignant neoplasm of skin: Secondary | ICD-10-CM | POA: Insufficient documentation

## 2014-11-06 DIAGNOSIS — Z89421 Acquired absence of other right toe(s): Secondary | ICD-10-CM | POA: Insufficient documentation

## 2014-11-06 DIAGNOSIS — W19XXXA Unspecified fall, initial encounter: Secondary | ICD-10-CM | POA: Insufficient documentation

## 2014-11-06 DIAGNOSIS — F419 Anxiety disorder, unspecified: Secondary | ICD-10-CM | POA: Insufficient documentation

## 2014-11-06 DIAGNOSIS — Z87891 Personal history of nicotine dependence: Secondary | ICD-10-CM | POA: Insufficient documentation

## 2014-11-06 DIAGNOSIS — Z6833 Body mass index (BMI) 33.0-33.9, adult: Secondary | ICD-10-CM | POA: Insufficient documentation

## 2014-11-06 DIAGNOSIS — M1990 Unspecified osteoarthritis, unspecified site: Secondary | ICD-10-CM | POA: Insufficient documentation

## 2014-11-06 DIAGNOSIS — I10 Essential (primary) hypertension: Secondary | ICD-10-CM | POA: Insufficient documentation

## 2014-11-06 DIAGNOSIS — Y929 Unspecified place or not applicable: Secondary | ICD-10-CM | POA: Insufficient documentation

## 2014-11-06 DIAGNOSIS — Z9989 Dependence on other enabling machines and devices: Secondary | ICD-10-CM | POA: Insufficient documentation

## 2014-11-06 DIAGNOSIS — Z79891 Long term (current) use of opiate analgesic: Secondary | ICD-10-CM | POA: Insufficient documentation

## 2014-11-06 DIAGNOSIS — Y939 Activity, unspecified: Secondary | ICD-10-CM | POA: Insufficient documentation

## 2014-11-06 DIAGNOSIS — G8918 Other acute postprocedural pain: Secondary | ICD-10-CM | POA: Diagnosis not present

## 2014-11-06 DIAGNOSIS — G4730 Sleep apnea, unspecified: Secondary | ICD-10-CM | POA: Insufficient documentation

## 2014-11-06 HISTORY — PX: ORIF ANKLE FRACTURE: SHX5408

## 2014-11-06 SURGERY — OPEN REDUCTION INTERNAL FIXATION (ORIF) ANKLE FRACTURE
Anesthesia: Regional | Site: Ankle | Laterality: Left

## 2014-11-06 MED ORDER — CEFAZOLIN SODIUM-DEXTROSE 2-3 GM-% IV SOLR
2.0000 g | INTRAVENOUS | Status: AC
  Administered 2014-11-06: 2 g via INTRAVENOUS

## 2014-11-06 MED ORDER — LACTATED RINGERS IV SOLN
INTRAVENOUS | Status: DC
  Administered 2014-11-06: 11:00:00 via INTRAVENOUS

## 2014-11-06 MED ORDER — GLYCOPYRROLATE 0.2 MG/ML IJ SOLN: 0.2000 mg | Freq: Once | INTRAMUSCULAR | Status: DC | PRN

## 2014-11-06 MED ORDER — BUPIVACAINE-EPINEPHRINE (PF) 0.5% -1:200000 IJ SOLN
INTRAMUSCULAR | Status: DC | PRN
  Administered 2014-11-06 (×2): 10 mL via PERINEURAL

## 2014-11-06 MED ORDER — SODIUM CHLORIDE 0.9 % IV SOLN
INTRAVENOUS | Status: DC
Start: 2014-11-06 — End: 2014-11-06

## 2014-11-06 MED ORDER — LIDOCAINE HCL (CARDIAC) 20 MG/ML IV SOLN
INTRAVENOUS | Status: DC | PRN
  Administered 2014-11-06: 80 mg via INTRAVENOUS

## 2014-11-06 MED ORDER — FENTANYL CITRATE (PF) 100 MCG/2ML IJ SOLN
50.0000 ug | INTRAMUSCULAR | Status: DC | PRN
  Administered 2014-11-06: 100 ug via INTRAVENOUS

## 2014-11-06 MED ORDER — SCOPOLAMINE 1 MG/3DAYS TD PT72: 1.0000 | MEDICATED_PATCH | Freq: Once | TRANSDERMAL | Status: DC | PRN

## 2014-11-06 MED ORDER — ROPIVACAINE HCL 5 MG/ML IJ SOLN
INTRAMUSCULAR | Status: DC | PRN
  Administered 2014-11-06 (×2): 10 mL via PERINEURAL

## 2014-11-06 MED ORDER — OXYCODONE HCL 5 MG PO TABS: 5.0000 mg | ORAL_TABLET | ORAL | Status: DC | PRN

## 2014-11-06 MED ORDER — ASPIRIN EC 325 MG PO TBEC: 325.0000 mg | DELAYED_RELEASE_TABLET | Freq: Every day | ORAL | Status: DC

## 2014-11-06 MED ORDER — 0.9 % SODIUM CHLORIDE (POUR BTL) OPTIME
TOPICAL | Status: DC | PRN
  Administered 2014-11-06: 200 mL

## 2014-11-06 MED ORDER — HYDROMORPHONE HCL 1 MG/ML IJ SOLN: 0.2500 mg | INTRAMUSCULAR | Status: DC | PRN

## 2014-11-06 MED ORDER — DOCUSATE SODIUM 100 MG PO CAPS: 100.0000 mg | ORAL_CAPSULE | Freq: Two times a day (BID) | ORAL | Status: DC

## 2014-11-06 MED ORDER — ONDANSETRON HCL 4 MG/2ML IJ SOLN
INTRAMUSCULAR | Status: DC | PRN
  Administered 2014-11-06: 4 mg via INTRAVENOUS

## 2014-11-06 MED ORDER — SENNA 8.6 MG PO TABS: 2.0000 | ORAL_TABLET | Freq: Two times a day (BID) | ORAL | Status: DC

## 2014-11-06 MED ORDER — PROPOFOL 10 MG/ML IV BOLUS
INTRAVENOUS | Status: DC | PRN
  Administered 2014-11-06: 200 mg via INTRAVENOUS

## 2014-11-06 MED ORDER — OXYCODONE HCL 5 MG/5ML PO SOLN: 5.0000 mg | Freq: Once | ORAL | Status: DC | PRN

## 2014-11-06 MED ORDER — CHLORHEXIDINE GLUCONATE 4 % EX LIQD: 60.0000 mL | Freq: Once | CUTANEOUS | Status: DC

## 2014-11-06 MED ORDER — OXYCODONE HCL 5 MG PO TABS: 5.0000 mg | ORAL_TABLET | Freq: Once | ORAL | Status: DC | PRN

## 2014-11-06 MED ORDER — MIDAZOLAM HCL 2 MG/2ML IJ SOLN
1.0000 mg | INTRAMUSCULAR | Status: DC | PRN
  Administered 2014-11-06: 2 mg via INTRAVENOUS

## 2014-11-06 MED ORDER — DEXAMETHASONE SODIUM PHOSPHATE 4 MG/ML IJ SOLN
INTRAMUSCULAR | Status: DC | PRN
  Administered 2014-11-06: 10 mg via INTRAVENOUS

## 2014-11-06 MED ORDER — MEPERIDINE HCL 25 MG/ML IJ SOLN: 6.2500 mg | INTRAMUSCULAR | Status: DC | PRN

## 2014-11-06 SURGICAL SUPPLY — 78 items
BANDAGE ESMARK 6X9 LF (GAUZE/BANDAGES/DRESSINGS) ×1 IMPLANT
BIT DRILL 2.5X2.75 QC CALB (BIT) ×2 IMPLANT
BIT DRILL 3.5X5.5 QC CALB (BIT) ×2 IMPLANT
BLADE SURG 15 STRL LF DISP TIS (BLADE) ×2 IMPLANT
BLADE SURG 15 STRL SS (BLADE) ×2
BNDG COHESIVE 4X5 TAN STRL (GAUZE/BANDAGES/DRESSINGS) ×2 IMPLANT
BNDG COHESIVE 6X5 TAN STRL LF (GAUZE/BANDAGES/DRESSINGS) ×2 IMPLANT
BNDG ESMARK 4X9 LF (GAUZE/BANDAGES/DRESSINGS) IMPLANT
BNDG ESMARK 6X9 LF (GAUZE/BANDAGES/DRESSINGS) ×2
CANISTER SUCT 1200ML W/VALVE (MISCELLANEOUS) ×2 IMPLANT
CHLORAPREP W/TINT 26ML (MISCELLANEOUS) ×2 IMPLANT
COVER BACK TABLE 60X90IN (DRAPES) ×2 IMPLANT
CUFF TOURNIQUET SINGLE 34IN LL (TOURNIQUET CUFF) ×2 IMPLANT
DECANTER SPIKE VIAL GLASS SM (MISCELLANEOUS) IMPLANT
DRAPE EXTREMITY T 121X128X90 (DRAPE) ×2 IMPLANT
DRAPE OEC MINIVIEW 54X84 (DRAPES) ×2 IMPLANT
DRAPE U-SHAPE 47X51 STRL (DRAPES) ×2 IMPLANT
DRSG ADAPTIC 3X8 NADH LF (GAUZE/BANDAGES/DRESSINGS) IMPLANT
DRSG EMULSION OIL 3X3 NADH (GAUZE/BANDAGES/DRESSINGS) IMPLANT
DRSG MEPITEL 4X7.2 (GAUZE/BANDAGES/DRESSINGS) ×2 IMPLANT
DRSG PAD ABDOMINAL 8X10 ST (GAUZE/BANDAGES/DRESSINGS) ×4 IMPLANT
ELECT REM PT RETURN 9FT ADLT (ELECTROSURGICAL) ×2
ELECTRODE REM PT RTRN 9FT ADLT (ELECTROSURGICAL) ×1 IMPLANT
GAUZE SPONGE 4X4 12PLY STRL (GAUZE/BANDAGES/DRESSINGS) ×2 IMPLANT
GLOVE BIO SURGEON STRL SZ8 (GLOVE) ×2 IMPLANT
GLOVE BIOGEL M STRL SZ7.5 (GLOVE) ×2 IMPLANT
GLOVE BIOGEL PI IND STRL 7.0 (GLOVE) ×1 IMPLANT
GLOVE BIOGEL PI IND STRL 8 (GLOVE) ×3 IMPLANT
GLOVE BIOGEL PI INDICATOR 7.0 (GLOVE) ×1
GLOVE BIOGEL PI INDICATOR 8 (GLOVE) ×3
GLOVE ECLIPSE 6.5 STRL STRAW (GLOVE) ×2 IMPLANT
GLOVE ECLIPSE 7.5 STRL STRAW (GLOVE) ×2 IMPLANT
GLOVE EXAM NITRILE MD LF STRL (GLOVE) ×2 IMPLANT
GOWN STRL REUS W/ TWL LRG LVL3 (GOWN DISPOSABLE) ×1 IMPLANT
GOWN STRL REUS W/ TWL XL LVL3 (GOWN DISPOSABLE) ×3 IMPLANT
GOWN STRL REUS W/TWL LRG LVL3 (GOWN DISPOSABLE) ×1
GOWN STRL REUS W/TWL XL LVL3 (GOWN DISPOSABLE) ×3
K-WIRE ACE 1.6X6 (WIRE) ×2
KWIRE ACE 1.6X6 (WIRE) ×1 IMPLANT
NEEDLE HYPO 22GX1.5 SAFETY (NEEDLE) IMPLANT
NS IRRIG 1000ML POUR BTL (IV SOLUTION) ×2 IMPLANT
PACK BASIN DAY SURGERY FS (CUSTOM PROCEDURE TRAY) ×2 IMPLANT
PAD CAST 4YDX4 CTTN HI CHSV (CAST SUPPLIES) ×1 IMPLANT
PADDING CAST ABS 4INX4YD NS (CAST SUPPLIES)
PADDING CAST ABS COTTON 4X4 ST (CAST SUPPLIES) IMPLANT
PADDING CAST COTTON 4X4 STRL (CAST SUPPLIES) ×1
PADDING CAST COTTON 6X4 STRL (CAST SUPPLIES) ×2 IMPLANT
PENCIL BUTTON HOLSTER BLD 10FT (ELECTRODE) ×2 IMPLANT
PLATE ACE 100DEG 8HOLE (Plate) ×2 IMPLANT
SANITIZER HAND PURELL 535ML FO (MISCELLANEOUS) ×2 IMPLANT
SCREW ACE CAN 4.0 55M (Screw) ×2 IMPLANT
SCREW CORTICAL 3.5MM  16MM (Screw) ×3 IMPLANT
SCREW CORTICAL 3.5MM 16MM (Screw) ×3 IMPLANT
SCREW CORTICAL 3.5MM 24MM (Screw) ×2 IMPLANT
SCREW NLOCK CANC HEX 4X20 (Screw) ×2 IMPLANT
SCREW NLOCK CANC HEX 4X20 FIB (Screw) ×2 IMPLANT
SCREW NLOCK CANC HEX 4X22 (Screw) ×2 IMPLANT
SHEET MEDIUM DRAPE 40X70 STRL (DRAPES) ×2 IMPLANT
SLEEVE SCD COMPRESS KNEE MED (MISCELLANEOUS) ×2 IMPLANT
SPLINT FAST PLASTER 5X30 (CAST SUPPLIES) ×20
SPLINT PLASTER CAST FAST 5X30 (CAST SUPPLIES) ×20 IMPLANT
SPONGE LAP 18X18 X RAY DECT (DISPOSABLE) ×2 IMPLANT
STOCKINETTE 6  STRL (DRAPES) ×1
STOCKINETTE 6 STRL (DRAPES) ×1 IMPLANT
SUCTION FRAZIER TIP 10 FR DISP (SUCTIONS) ×2 IMPLANT
SUT ETHILON 3 0 PS 1 (SUTURE) ×2 IMPLANT
SUT FIBERWIRE #2 38 T-5 BLUE (SUTURE)
SUT MNCRL AB 3-0 PS2 18 (SUTURE) ×2 IMPLANT
SUT VIC AB 0 SH 27 (SUTURE) IMPLANT
SUT VIC AB 2-0 SH 27 (SUTURE)
SUT VIC AB 2-0 SH 27XBRD (SUTURE) IMPLANT
SUT VICRYL 4-0 PS2 18IN ABS (SUTURE) IMPLANT
SUTURE FIBERWR #2 38 T-5 BLUE (SUTURE) IMPLANT
SYR BULB 3OZ (MISCELLANEOUS) ×2 IMPLANT
SYR CONTROL 10ML LL (SYRINGE) IMPLANT
TOWEL OR 17X24 6PK STRL BLUE (TOWEL DISPOSABLE) ×4 IMPLANT
TUBE CONNECTING 20X1/4 (TUBING) ×2 IMPLANT
UNDERPAD 30X30 (UNDERPADS AND DIAPERS) ×2 IMPLANT

## 2014-11-06 NOTE — Brief Op Note (Signed)
11/06/2014  1:22 PM  PATIENT:  David [name]  74 y.o. male  PRE-OPERATIVE DIAGNOSIS:  LEFT ANKLE BIMALLEOLAR ANKLE FRACTURE (lateral and posterior)  POST-OPERATIVE DIAGNOSIS:  LEFT ANKLE BIMALLEOLAR ANKLE FRACTURE (lateral and posterior)  Procedure(s): 1.  OPEN REDUCTION INTERNAL FIXATION (ORIF) LEFT  ANKLE bimalleolar FRACTURE 2.  Stress exam of left ankle 3.  Left ankle 3 view xrays  SURGEON:  [name], MD  ASSISTANT: Mechele Claude, PA-C  ANESTHESIA:   General, regional  EBL:  minimal   TOURNIQUET:  approx 35 min at [id number] mm Hg  COMPLICATIONS:  None apparent  DISPOSITION:  Extubated, awake and stable to recovery.  DICTATION ID:  [id number]

## 2014-11-06 NOTE — H&P (Signed)
David [name] is an 74 y.o. male.   Chief Complaint: left ankle fracture HPI: 74 y/o male with PMH of diabetes and CAD fell last week injuring his lef tankle .  Xrays show a bimal fracture that is displaced and unstable.  He presents now for surgical treatment.  Past Medical History  Diagnosis Date  . Hypertension   . CAD (coronary artery disease)   . Renal insufficiency   . Palpitations   . Dyspnea   . Sinus bradycardia   . Obesity   . Allergy   . Kidney failure   . Arthritis   . Anxiety   . Cancer     skin  . Sleep apnea     uses CPAP nightly  . Diabetes     IDDM on pump  . Diabetes     Past Surgical History  Procedure Laterality Date  . Insert / replace / remove pacemaker    . Knee surgery    . Vein ligation and stripping    . 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 Cancer Mother     breast  . Heart attack Father    Social History:  reports that he has quit smoking. He has never used smokeless tobacco. He reports that he drinks about 1.2 oz of alcohol per week. He reports that he does not use illicit drugs.  Allergies: No Known Allergies  Medications Prior to Admission  Medication Sig Dispense Refill  . acetaminophen (TYLENOL) 500 MG tablet Take 1,000 mg by mouth every 6 (six) hours as needed for fever.    Marland Kitchen aspirin 81 MG tablet Take 81 mg by mouth daily.    . carvedilol (COREG) 25 MG tablet Take 1 tablet (25 mg total) by mouth 2 (two) times daily with a meal. 90 tablet 1  . doxazosin (CARDURA) 4 MG tablet Take 1 tablet (4 mg total) by mouth daily. 90 tablet 1  . glucose blood (BAYER CONTOUR NEXT TEST) test strip Use as instructed to check blood sugar 6 times per day dx code E11.69, patient is on insulin pump and is medically required to check 6 times per day. 200 each 3  . HYDROcodone-acetaminophen (NORCO/VICODIN) 5-325 MG per tablet Take 1 tablet by mouth every 4 (four) hours as needed for moderate  pain. 20 tablet 0  . isosorbide mononitrate (IMDUR) 30 MG 24 hr tablet TAKE TWO TABLETS BY MOUTH ONCE DAILY 60 tablet 0  . lisinopril (PRINIVIL,ZESTRIL) 10 MG tablet Take 1 tablet (10 mg total) by mouth daily. Resume in 2-3 days. 1 tablet 0  . LORazepam (ATIVAN) 1 MG tablet Take 1 tablet (1 mg total) by mouth every 6 (six) hours as needed for anxiety. 15 tablet 0  . Omega-3 Fatty Acids (FISH OIL) 1000 MG CAPS Take by mouth 2 (two) times daily.    . pantoprazole (PROTONIX) 40 MG tablet Take 1 tablet (40 mg total) by mouth daily. 90 tablet 1  . torsemide (DEMADEX) 20 MG tablet Take 2 tablets (40 mg total) by mouth daily. Resume in 2-3 days. 60 tablet 0  . B-D ULTRAFINE III SHORT PEN 31G X 8 MM MISC     . calcitRIOL (ROCALTROL) 0.25 MCG capsule Take 0.25 mcg by mouth daily.     . insulin aspart (NOVOLOG) 100 UNIT/ML injection Use max 120 units per day with insulin pump. 40 mL 5  . Liraglutide (VICTOZA)  18 MG/3ML SOPN Inject 1.8 mg into the skin daily. Inject once daily at the same time 3 pen 3  . neomycin-polymyxin-hydrocortisone (CORTISPORIN) otic solution Place 3 drops into the left ear 4 (four) times daily. 10 mL 0    Results for orders placed or performed in visit on 11/04/14 (from the past 48 hour(s))  Basic Metabolic Panel (BMET)     Status: Abnormal   Collection Time: 11/04/14 12:05 PM  Result Value Ref Range   Sodium 135 135 - 145 mEq/L   Potassium 4.9 3.5 - 5.1 mEq/L   Chloride 101 96 - 112 mEq/L   CO2 26 19 - 32 mEq/L   Glucose, Bld 102 (H) 70 - 99 mg/dL   BUN 66 (H) 6 - 23 mg/dL   Creatinine, Ser 2.14 (H) 0.40 - 1.50 mg/dL   Calcium 9.4 8.4 - 10.5 mg/dL   GFR 32.25 (L) >60.00 mL/min   No results found.  ROS  No recent f/c/n/v/wt loss  Blood pressure 112/40, pulse 60, temperature 98 F (36.7 C), temperature source Oral, resp. rate 11, height 6\' 4"  (1.93 m), weight 124.739 kg (275 lb), SpO2 100 %. Physical Exam  Obese male in nad.  A and O x 4.  Mood and affect normal.  EOMi.   resp unlabored.  L ankle with heatlhy skin and slight swelling.  Brisk cap refill at the toes.  Diminished sens to LT at the toes.  No lymphadenopathy.  Assessment/Plan L ankle bimal fracture - to OR for ORIF.  The risks and benefits of the alternative treatment options have been discussed in detail.  The patient wishes to proceed with surgery and specifically understands risks of bleeding, infection, nerve damage, blood clots, need for additional surgery, amputation and death.   [name] Nov 10, 2014, 11:49 AM

## 2014-11-06 NOTE — Anesthesia Procedure Notes (Addendum)
Anesthesia Regional Block:  Popliteal block  Pre-Anesthetic Checklist: ,, timeout performed, Correct Patient, Correct Site, Correct Laterality, Correct Procedure, Correct Position, site marked, Risks and benefits discussed,  Surgical consent,  Pre-op evaluation,  At surgeon's request and post-op pain management  Laterality: Left and Lower  Prep: chloraprep       Needles:  Injection technique: Single-shot  Needle Type: Echogenic Needle     Needle Length: 9cm 9 cm Needle Gauge: 21 and 21 G    Additional Needles:  Procedures: ultrasound guided (picture in chart) Popliteal block Narrative:  Start time: 11/06/2014 11:33 AM End time: 11/06/2014 11:37 AM Injection made incrementally with aspirations every 5 mL.  Performed by: Personally  Anesthesiologist: CREWS, Lional   Anesthesia Regional Block:  Adductor canal block  Pre-Anesthetic Checklist: ,, timeout performed, Correct Patient, Correct Site, Correct Laterality, Correct Procedure, Correct Position, site marked, Risks and benefits discussed,  Surgical consent,  Pre-op evaluation,  At surgeon's request and post-op pain management  Laterality: Left and Lower  Prep: chloraprep       Needles:  Injection technique: Single-shot  Needle Type: Echogenic Needle     Needle Length: 9cm 9 cm Needle Gauge: 21 and 21 G    Additional Needles:  Procedures: ultrasound guided (picture in chart) Adductor canal block Narrative:  Start time: 11/06/2014 11:39 AM End time: 11/06/2014 11:43 AM Injection made incrementally with aspirations every 5 mL.  Performed by: Personally  Anesthesiologist: CREWS, Rahn   Procedure Name: LMA Insertion Performed by: Terrance Mass Pre-anesthesia Checklist: Patient identified, Timeout performed, Emergency Drugs available, Suction available and Patient being monitored Patient Re-evaluated:Patient Re-evaluated prior to inductionOxygen Delivery Method: Circle system utilized Preoxygenation:  Pre-oxygenation with 100% oxygen Intubation Type: IV induction Ventilation: Mask ventilation without difficulty LMA: LMA inserted LMA Size: 5.0 Tube type: Oral Number of attempts: 1 Tube secured with: Tape Dental Injury: Teeth and Oropharynx as per pre-operative assessment

## 2014-11-06 NOTE — Progress Notes (Signed)
Assisted Dr. Al Corpus with left, ultrasound guided, popliteal/saphenous block. Side rails up, monitors on throughout procedure. See vital signs in flow sheet. Tolerated Procedure well.

## 2014-11-06 NOTE — Transfer of Care (Signed)
Immediate Anesthesia Transfer of Care Note  Patient: David [name]  Procedure(s) Performed: Procedure(s): OPEN REDUCTION INTERNAL FIXATION (ORIF) LEFT  ANKLE FRACTURE (Left)  Patient Location: PACU  Anesthesia Type:General  Level of Consciousness: awake and sedated  Airway & Oxygen Therapy: Patient Spontanous Breathing and Patient connected to face mask oxygen  Post-op Assessment: Report given to RN and Post -op Vital signs reviewed and stable  Post vital signs: Reviewed and stable  Last Vitals:  Filed Vitals:   11/06/14 1140  BP:   Pulse: 60  Temp:   Resp:     Complications: No apparent anesthesia complications

## 2014-11-06 NOTE — Anesthesia Preprocedure Evaluation (Signed)
Anesthesia Evaluation  Patient identified by MRN, date of birth, ID band Patient awake    Reviewed: Allergy & Precautions, NPO status , Patient's Chart, lab work & pertinent test results  Airway Mallampati: II  TM Distance: >3 FB Neck ROM: Full    Dental  (+) Upper Dentures, Lower Dentures, Dental Advisory Given   Pulmonary sleep apnea and Continuous Positive Airway Pressure Ventilation , former smoker,  breath sounds clear to auscultation        Cardiovascular hypertension, Pt. on medications and Pt. on home beta blockers + CAD + Valvular Problems/Murmurs AI Rhythm:Regular Rate:Normal     Neuro/Psych    GI/Hepatic   Endo/Other  diabetes, Well Controlled, Type 2, Insulin DependentMorbid obesity  Renal/GU Renal InsufficiencyRenal disease     Musculoskeletal   Abdominal   Peds  Hematology   Anesthesia Other Findings   Reproductive/Obstetrics                             Anesthesia Physical Anesthesia Plan  ASA: III  Anesthesia Plan: General and Regional   Post-op Pain Management:    Induction: Intravenous  Airway Management Planned: LMA  Additional Equipment:   Intra-op Plan:   Post-operative Plan: Extubation in OR  Informed Consent: I have reviewed the patients History and Physical, chart, labs and discussed the procedure including the risks, benefits and alternatives for the proposed anesthesia with the patient or authorized representative who has indicated his/her understanding and acceptance.   Dental advisory given  Plan Discussed with: CRNA, Anesthesiologist and Surgeon  Anesthesia Plan Comments:         Anesthesia Quick Evaluation

## 2014-11-06 NOTE — Op Note (Signed)
NAME:  David [name], David [name] NO.:  [id number]  MEDICAL RECORD NO.:  [id number]  LOCATION:                                 FACILITY:  PHYSICIAN:  [name], MD        DATE OF BIRTH:  December 29, 1940  DATE OF PROCEDURE:  11/06/2014 DATE OF DISCHARGE:                              OPERATIVE REPORT   POSTOPERATIVE DIAGNOSIS:  Left ankle bimalleolar fracture (lateral and posterior malleoli).  POSTOPERATIVE DIAGNOSIS:  Left ankle bimalleolar fracture (lateral and posterior malleoli).  PROCEDURE: 1. open reduction and internal fixation of left ankle bimalleolar     fracture. 2. Stress examination of the left ankle. 3. AP, mortise, and lateral radiographs of the left ankle.  SURGEON:  [name], MD.  ASSISTANT:  Mechele Claude, PA-C.  ANESTHESIA:  General, regional.  ESTIMATED BLOOD LOSS:  Minimal.  TOURNIQUET TIME:  Approximately 35 minutes at 250 mmHg.  COMPLICATIONS:  None apparent.  DISPOSITION:  Extubated, awake, and stable to recovery.  INDICATIONS FOR PROCEDURE:  The patient is a 74 year old male, who fell over a week ago injuring the left ankle.  Radiographs show a displaced bimalleolar fracture of the lateral and posterior malleoli.  He presents now for operative treatment of this displaced unstable fracture.  He understands the risks and benefits, the alternative treatment options, and elects surgical treatment.  He specifically understands risks of bleeding, infection, nerve damage, blood clots, need for additional surgery, continued pain, amputation, and death.  PROCEDURE IN DETAIL:  After preoperative consent was obtained and the correct operative site was identified, the patient was brought to the operating room and placed supine on the operating table.  General anesthesia was induced.  Preoperative antibiotics were administered. Surgical time-out was taken.  Left lower extremity was prepped and draped in standard sterile fashion with tourniquet  around the thigh. The extremity was exsanguinated and tourniquet was inflated to 250 mmHg. A longitudinal incision was made over the lateral malleolus.  Sharp dissection was carried down through skin and subcutaneous tissue.  The fracture site was identified.  It was opened and cleaned of all hematoma.  Fracture was reduced and held with a lobster claw.  A 3.5-mm fully-threaded lag screw was inserted from posterior to anterior across the fracture site.  It was noted to have excellent purchase and compressed the fracture site appropriately.  An 8-hole one-third tubular plate was then contoured to fit the lateral malleolus.  It was secured proximally with 3 bicortical screws and distally with 3 unicortical screws.  AP and lateral radiographs confirmed appropriate reduction of fracture and appropriate position and length of the hardware. The posterior malleolus fracture was identified on the lateral view.  It was noted to be slightly displaced and made approximately 20% of the articular surface.  A K-wire was inserted across the distal tibia from anterior to posterior at the lateral half of the distal tibia.  The stab incision was made and a 4-mm partially threaded cannulated screw was inserted.  It was noted to compress the fracture site appropriately. The K-wire was removed.  Final AP, mortise, and lateral radiographs confirmed appropriate position and length of all hardware and appropriate reduction of  the 2 fracture sites.  A stress examination was then performed under live fluoroscopy.  There was no widening of the ankle mortise for medial clear space.  The wounds were irrigated copiously.  Inverted simple sutures of 2-0 Vicryl were used to close the deep subcutaneous tissues.  The superficial subcutaneous tissues were closed with inverted simple sutures of 3-0 Monocryl and a running 3-0 nylon was used to close the skin incision.  Nylon was used to close the anterior incision.  Sterile  dressings were applied followed by a well- padded short-leg splint.  Tourniquet was released.  After application of the dressings at 35 minutes, the patient was awakened from anesthesia and transported to the recovery room in stable condition.  FOLLOWUP PLAN:  The patient will be nonweightbearing on the left lower extremity.  He will follow up with me in 2 weeks for suture removal and conversion to a cast.  RADIOGRAPHS:  AP, mortise, and lateral radiographs of the left ankle were obtained intraoperatively.  These show interval reduction and fixation of the lateral and posterior malleolus fractures.  No widening of the ankle mortise on stress examination.  No other acute injuries were noted.  Mechele Claude, PA-C was present and scrubbed for the duration of the case.  His assistance was essential in positioning the patient, prepping and draping, gaining and maintaining exposure, performing the operation, closing and dressing the wounds, and applying the splint.     [name], MD     JH/MEDQ  D:  11/06/2014  T:  11/06/2014  Job:  [id number]

## 2014-11-06 NOTE — Anesthesia Postprocedure Evaluation (Signed)
  Anesthesia Post-op Note  Patient: David [name]  Procedure(s) Performed: Procedure(s): OPEN REDUCTION INTERNAL FIXATION (ORIF) LEFT  ANKLE FRACTURE (Left)  Patient Location: PACU  Anesthesia Type: General, Regional   Level of Consciousness: awake, alert  and oriented  Airway and Oxygen Therapy: Patient Spontanous Breathing  Post-op Pain: none  Post-op Assessment: Post-op Vital signs reviewed  Post-op Vital Signs: Reviewed  Last Vitals:  Filed Vitals:   11/06/14 1430  BP: 155/48  Pulse: 60  Temp:   Resp: 15    Complications: No apparent anesthesia complications

## 2014-11-06 NOTE — Discharge Instructions (Addendum)
David Hewitt, MD °[facility] ° °Please read the following information regarding your care after surgery. ° °Medications  °You only need a prescription for the narcotic pain medicine (ex. oxycodone, Percocet, Norco).  All of the other medicines listed below are available over the counter. °X acetominophen (Tylenol) 650 mg every 4-6 hours as you need for minor pain °X oxycodone as prescribed for moderate to severe pain °?  ° °Narcotic pain medicine (ex. oxycodone, Percocet, Vicodin) will cause constipation.  To prevent this problem, take the following medicines while you are taking any pain medicine. °X docusate sodium (Colace) 100 mg twice a day X senna (Senokot) 2 tablets twice a day ° °X To help prevent blood clots, take an aspirin (325 mg) once a day for a month after surgery.  You should also get up every hour while you are awake to move around.   ° °Weight Bearing °X Do not bear any weight on the operated leg or foot. ° °Cast / Splint / Dressing °X Keep your splint or cast clean and dry.  Don’t put anything (coat hanger, pencil, etc) down inside of it.  If it gets damp, use a hair dryer on the cool setting to dry it.  If it gets soaked, call the office to schedule an appointment for a cast change. ° ° °After your dressing, cast or splint is removed; you may shower, but do not soak or scrub the wound.  Allow the water to run over it, and then gently pat it dry. ° °Swelling °It is normal for you to have swelling where you had surgery.  To reduce swelling and pain, keep your toes above your nose for at least 3 days after surgery.  It may be necessary to keep your foot or leg elevated for several weeks.  If it hurts, it should be elevated. ° °Follow Up °Call my office at [phone] when you are discharged from the hospital or surgery center to schedule an appointment to be seen two weeks after surgery. ° °Call my office at [phone] if you develop a fever >101.5° F, nausea, vomiting, bleeding from  the surgical site or severe pain.   ° °Regional Anesthesia Blocks ° °1. Numbness or the inability to move the "blocked" extremity may last from 3-48 hours after placement. The length of time depends on the medication injected and your individual response to the medication. If the numbness is not going away after 48 hours, call your surgeon. ° °2. The extremity that is blocked will need to be protected until the numbness is gone and the  Strength has returned. Because you cannot feel it, you will need to take extra care to avoid injury. Because it may be weak, you may have difficulty moving it or using it. You may not know what position it is in without looking at it while the block is in effect. ° °3. For blocks in the legs and feet, returning to weight bearing and walking needs to be done carefully. You will need to wait until the numbness is entirely gone and the strength has returned. You should be able to move your leg and foot normally before you try and bear weight or walk. You will need someone to be with you when you first try to ensure you do not fall and possibly risk injury. ° °4. Bruising and tenderness at the needle site are common side effects and will resolve in a few days. ° °5. Persistent numbness or new problems with movement   should be communicated to the surgeon or the [facility] ([phone])/ [facility] ([phone]). ° °Post Anesthesia Home Care Instructions ° °Activity: °Get plenty of rest for the remainder of the day. A responsible adult should stay with you for 24 hours following the procedure.  °For the next 24 hours, DO NOT: °-Drive a car °-Operate machinery °-Drink alcoholic beverages °-Take any medication unless instructed by your physician °-Make any legal decisions or sign important papers. ° °Meals: °Start with liquid foods such as gelatin or soup. Progress to regular foods as tolerated. Avoid greasy, spicy, heavy foods. If nausea and/or vomiting occur,  drink only clear liquids until the nausea and/or vomiting subsides. Call your physician if vomiting continues. ° °Special Instructions/Symptoms: °Your throat may feel dry or sore from the anesthesia or the breathing tube placed in your throat during surgery. If this causes discomfort, gargle with warm salt water. The discomfort should disappear within 24 hours. ° °If you had a scopolamine patch placed behind your ear for the management of post- operative nausea and/or vomiting: ° °1. The medication in the patch is effective for 72 hours, after which it should be removed.  Wrap patch in a tissue and discard in the trash. Wash hands thoroughly with soap and water. °2. You may remove the patch earlier than 72 hours if you experience unpleasant side effects which may include dry mouth, dizziness or visual disturbances. °3. Avoid touching the patch. Wash your hands with soap and water after contact with the patch. °  ° °

## 2014-11-07 ENCOUNTER — Encounter (HOSPITAL_BASED_OUTPATIENT_CLINIC_OR_DEPARTMENT_OTHER): Payer: Self-pay | Admitting: Orthopedic Surgery

## 2014-11-07 DIAGNOSIS — S96901D Unspecified injury of unspecified muscle and tendon at ankle and foot level, right foot, subsequent encounter: Secondary | ICD-10-CM | POA: Diagnosis not present

## 2014-11-07 DIAGNOSIS — S82892D Other fracture of left lower leg, subsequent encounter for closed fracture with routine healing: Secondary | ICD-10-CM | POA: Diagnosis not present

## 2014-11-07 DIAGNOSIS — S82402D Unspecified fracture of shaft of left fibula, subsequent encounter for closed fracture with routine healing: Secondary | ICD-10-CM | POA: Diagnosis not present

## 2014-11-07 DIAGNOSIS — S96902D Unspecified injury of unspecified muscle and tendon at ankle and foot level, left foot, subsequent encounter: Secondary | ICD-10-CM | POA: Diagnosis not present

## 2014-11-07 DIAGNOSIS — S96891D Other specified injury of other specified muscles and tendons at ankle and foot level, right foot, subsequent encounter: Secondary | ICD-10-CM | POA: Diagnosis not present

## 2014-11-07 DIAGNOSIS — L03115 Cellulitis of right lower limb: Secondary | ICD-10-CM | POA: Diagnosis not present

## 2014-11-11 DIAGNOSIS — S82892D Other fracture of left lower leg, subsequent encounter for closed fracture with routine healing: Secondary | ICD-10-CM | POA: Diagnosis not present

## 2014-11-11 DIAGNOSIS — S82402D Unspecified fracture of shaft of left fibula, subsequent encounter for closed fracture with routine healing: Secondary | ICD-10-CM | POA: Diagnosis not present

## 2014-11-11 DIAGNOSIS — L602 Onychogryphosis: Secondary | ICD-10-CM | POA: Diagnosis not present

## 2014-11-11 DIAGNOSIS — L97519 Non-pressure chronic ulcer of other part of right foot with unspecified severity: Secondary | ICD-10-CM | POA: Diagnosis not present

## 2014-11-11 DIAGNOSIS — S96891D Other specified injury of other specified muscles and tendons at ankle and foot level, right foot, subsequent encounter: Secondary | ICD-10-CM | POA: Diagnosis not present

## 2014-11-11 DIAGNOSIS — S96901D Unspecified injury of unspecified muscle and tendon at ankle and foot level, right foot, subsequent encounter: Secondary | ICD-10-CM | POA: Diagnosis not present

## 2014-11-11 DIAGNOSIS — E1151 Type 2 diabetes mellitus with diabetic peripheral angiopathy without gangrene: Secondary | ICD-10-CM | POA: Diagnosis not present

## 2014-11-11 DIAGNOSIS — L84 Corns and callosities: Secondary | ICD-10-CM | POA: Diagnosis not present

## 2014-11-11 DIAGNOSIS — L03115 Cellulitis of right lower limb: Secondary | ICD-10-CM | POA: Diagnosis not present

## 2014-11-11 DIAGNOSIS — S96902D Unspecified injury of unspecified muscle and tendon at ankle and foot level, left foot, subsequent encounter: Secondary | ICD-10-CM | POA: Diagnosis not present

## 2014-11-11 NOTE — Telephone Encounter (Signed)
Patient wife would like for you to call her concerning her husband, phone # [phone]

## 2014-11-12 DIAGNOSIS — S96891D Other specified injury of other specified muscles and tendons at ankle and foot level, right foot, subsequent encounter: Secondary | ICD-10-CM | POA: Diagnosis not present

## 2014-11-12 DIAGNOSIS — S96902D Unspecified injury of unspecified muscle and tendon at ankle and foot level, left foot, subsequent encounter: Secondary | ICD-10-CM | POA: Diagnosis not present

## 2014-11-12 DIAGNOSIS — S82402D Unspecified fracture of shaft of left fibula, subsequent encounter for closed fracture with routine healing: Secondary | ICD-10-CM | POA: Diagnosis not present

## 2014-11-12 DIAGNOSIS — S82892D Other fracture of left lower leg, subsequent encounter for closed fracture with routine healing: Secondary | ICD-10-CM | POA: Diagnosis not present

## 2014-11-12 DIAGNOSIS — L03115 Cellulitis of right lower limb: Secondary | ICD-10-CM | POA: Diagnosis not present

## 2014-11-12 DIAGNOSIS — S96901D Unspecified injury of unspecified muscle and tendon at ankle and foot level, right foot, subsequent encounter: Secondary | ICD-10-CM | POA: Diagnosis not present

## 2014-11-14 ENCOUNTER — Encounter: Payer: Self-pay | Admitting: *Deleted

## 2014-11-14 ENCOUNTER — Ambulatory Visit (INDEPENDENT_AMBULATORY_CARE_PROVIDER_SITE_OTHER): Payer: Medicare Other | Admitting: Internal Medicine

## 2014-11-14 ENCOUNTER — Encounter: Payer: Self-pay | Admitting: Internal Medicine

## 2014-11-14 VITALS — BP 158/48 | HR 61 | Temp 99.4°F | Resp 18

## 2014-11-14 DIAGNOSIS — E1129 Type 2 diabetes mellitus with other diabetic kidney complication: Secondary | ICD-10-CM

## 2014-11-14 DIAGNOSIS — I10 Essential (primary) hypertension: Secondary | ICD-10-CM

## 2014-11-14 DIAGNOSIS — E1165 Type 2 diabetes mellitus with hyperglycemia: Secondary | ICD-10-CM | POA: Diagnosis not present

## 2014-11-14 DIAGNOSIS — IMO0002 Reserved for concepts with insufficient information to code with codable children: Secondary | ICD-10-CM

## 2014-11-14 DIAGNOSIS — F4322 Adjustment disorder with anxiety: Secondary | ICD-10-CM | POA: Diagnosis not present

## 2014-11-14 MED ORDER — SERTRALINE HCL 50 MG PO TABS: ORAL_TABLET | ORAL | Status: DC

## 2014-11-14 MED ORDER — LORAZEPAM 1 MG PO TABS: ORAL_TABLET | ORAL | Status: DC

## 2014-11-14 NOTE — Progress Notes (Signed)
   Subjective:    Patient ID: David [name], male    DOB: 12/12/1940, 74 y.o.   MRN: [id number]  HPI He has been anxious and having panic attacks since he fractured his ankle 10/27/14. He slipped at the hospital while he was being treated for cellulitis. There was no neurocardiac prodrome prior to the fall.  The anxiety has been progressive and he's been taking lorazepam up to 1 mg as needed. He sleeping poorly and has anorexia.He denies a suicidal ideation  Family history is positive for alcoholism in both mother and father. His daughter is on sertraline.   His last A1c was 8.7% on 09/12/14. TSH was therapeutic in early June.  He has renal insufficiency and is followed in the Nephrology clinic. His most recent creatinine was 2.14; BUN 66; and GFR 32.25.   Review of Systems  Denied were any change in heart rhythm or rate prior to the fall. There was no associated chest pain or shortness of breath .  Also specifically denied prior to the episode were headache, limb weakness, tingling, or numbness. No seizure activity noted.     Objective:   Physical Exam  Pertinent or positive findings include: BMI :33.49. Pattern alopecia & goatee present. Upper plate ; mandible edentulous. Grade 1 systolic murmur. Breath sounds distant. Abdomen protuberant.Decreased pedal pulses. Faint erythema R shin w/o increased temperature.Small eschar mid shin.Edema RLE 1/2-1+. LLE wrapped.  General appearance :adequately nourished; in no distress.  Eyes: No conjunctival inflammation or scleral icterus is present.  Oral exam:  Lips and gums are healthy appearing.There is no oropharyngeal erythema or exudate noted.   Heart:  Normal rate and regular rhythm. S1 and S2 normal without gallop, click, rub or other extra sounds    Lungs:Chest clear to auscultation; no wheezes, rhonchi,rales ,or rubs present.No increased work of breathing.   Abdomen: bowel sounds normal, soft and non-tender without masses,  organomegaly or hernias noted.  No guarding or rebound.   Vascular : no bruits present.  Skin:Warm & dry.  Intact without suspicious lesions or rashes ; no tenting or jaundice   Lymphatic: No lymphadenopathy is noted about the head, neck, axilla   Neuro: Strength, tone decreased  Psych: anxious; defers to wife as to providing history.        Assessment & Plan:  #1 anxiety and panic attacks #2 DM, poorly controlled #3  Renal insufficiency  See orders recommendations. The pathophysiology of neurotransmitter deficiency was discussed. The risk of taking lorazepam on a regular basis was also discussed. Warning sheet as per Dr Tomasa Blase group provided.

## 2014-11-14 NOTE — Patient Instructions (Signed)
Please consider taking the agent to raise the neurotransmitters which are essential for good brain function, both intellectual & emotional health. These agents are not addictive and simply keep this essential neurotransmitter at therapeutic levels. If these levels become severely depleted; depression or panic attacks can occur.

## 2014-11-14 NOTE — Progress Notes (Signed)
Pre visit review using our clinic review tool, if applicable. No additional management support is needed unless otherwise documented below in the visit note. 

## 2014-11-18 DIAGNOSIS — S82402D Unspecified fracture of shaft of left fibula, subsequent encounter for closed fracture with routine healing: Secondary | ICD-10-CM | POA: Diagnosis not present

## 2014-11-18 DIAGNOSIS — S82892D Other fracture of left lower leg, subsequent encounter for closed fracture with routine healing: Secondary | ICD-10-CM | POA: Diagnosis not present

## 2014-11-18 DIAGNOSIS — S96891D Other specified injury of other specified muscles and tendons at ankle and foot level, right foot, subsequent encounter: Secondary | ICD-10-CM | POA: Diagnosis not present

## 2014-11-18 DIAGNOSIS — S96902D Unspecified injury of unspecified muscle and tendon at ankle and foot level, left foot, subsequent encounter: Secondary | ICD-10-CM | POA: Diagnosis not present

## 2014-11-18 DIAGNOSIS — S96901D Unspecified injury of unspecified muscle and tendon at ankle and foot level, right foot, subsequent encounter: Secondary | ICD-10-CM | POA: Diagnosis not present

## 2014-11-18 DIAGNOSIS — L03115 Cellulitis of right lower limb: Secondary | ICD-10-CM | POA: Diagnosis not present

## 2014-11-20 DIAGNOSIS — S96902D Unspecified injury of unspecified muscle and tendon at ankle and foot level, left foot, subsequent encounter: Secondary | ICD-10-CM | POA: Diagnosis not present

## 2014-11-20 DIAGNOSIS — S96891D Other specified injury of other specified muscles and tendons at ankle and foot level, right foot, subsequent encounter: Secondary | ICD-10-CM | POA: Diagnosis not present

## 2014-11-20 DIAGNOSIS — L03115 Cellulitis of right lower limb: Secondary | ICD-10-CM | POA: Diagnosis not present

## 2014-11-20 DIAGNOSIS — S96901D Unspecified injury of unspecified muscle and tendon at ankle and foot level, right foot, subsequent encounter: Secondary | ICD-10-CM | POA: Diagnosis not present

## 2014-11-20 DIAGNOSIS — S82402D Unspecified fracture of shaft of left fibula, subsequent encounter for closed fracture with routine healing: Secondary | ICD-10-CM | POA: Diagnosis not present

## 2014-11-20 DIAGNOSIS — S82892D Other fracture of left lower leg, subsequent encounter for closed fracture with routine healing: Secondary | ICD-10-CM | POA: Diagnosis not present

## 2014-11-24 ENCOUNTER — Telehealth: Payer: Self-pay | Admitting: Cardiology

## 2014-11-24 ENCOUNTER — Encounter: Payer: Self-pay | Admitting: Family

## 2014-11-24 ENCOUNTER — Telehealth: Payer: Self-pay | Admitting: Internal Medicine

## 2014-11-24 ENCOUNTER — Encounter: Payer: Medicare Other | Admitting: *Deleted

## 2014-11-24 ENCOUNTER — Telehealth: Payer: Self-pay | Admitting: Endocrinology

## 2014-11-24 ENCOUNTER — Telehealth: Payer: Self-pay | Admitting: Nutrition

## 2014-11-24 NOTE — Telephone Encounter (Signed)
New problem    Pt's wife stated the box that the remote transmission is done with has been lost and they need another one. Please call.

## 2014-11-24 NOTE — Telephone Encounter (Signed)
Noted, patient is aware. 

## 2014-11-24 NOTE — Telephone Encounter (Signed)
Confirmed remote transmission w/ pt wife.   

## 2014-11-24 NOTE — Telephone Encounter (Signed)
Fasting  Blood sugars (6AM)  are in the low 60s and is symtomatic.  "These are happening every morning."  HS readings (9PM- 3hr. PcS) are in the 130s-140s. Current basal rates:MN: 3.3,  6AM: 3.6,  9AM: 3.1,  3PM: 3.0,  7PM: 3.4

## 2014-11-24 NOTE — Telephone Encounter (Signed)
Reduce basal rate at midnight to 3.0

## 2014-11-24 NOTE — Telephone Encounter (Signed)
Pt needs assistance with adjusting his insulin pump

## 2014-11-24 NOTE — Telephone Encounter (Signed)
Please see below and advise.

## 2014-11-24 NOTE — Telephone Encounter (Signed)
Pt calling to let us know the BS are extremely low in AM in 60 and 70s, the pump is giving too much insulin

## 2014-11-24 NOTE — Telephone Encounter (Signed)
Spoke w/ pt wife and she informed me that pt home monitor has been misplaced / lost. Ordered pt a new monitor and new wirex. Pt wife aware.

## 2014-11-25 ENCOUNTER — Encounter: Payer: Self-pay | Admitting: Cardiology

## 2014-11-28 ENCOUNTER — Encounter (HOSPITAL_COMMUNITY): Payer: Self-pay | Admitting: *Deleted

## 2014-11-28 ENCOUNTER — Emergency Department (HOSPITAL_COMMUNITY)
Admission: EM | Admit: 2014-11-28 | Discharge: 2014-11-28 | Disposition: A | Discharge status: Home / Self Care | Payer: Medicare Other | Attending: Emergency Medicine | Admitting: Emergency Medicine

## 2014-11-28 ENCOUNTER — Telehealth: Payer: Self-pay | Admitting: Family

## 2014-11-28 ENCOUNTER — Emergency Department (HOSPITAL_COMMUNITY): Payer: Medicare Other

## 2014-11-28 DIAGNOSIS — Z85828 Personal history of other malignant neoplasm of skin: Secondary | ICD-10-CM | POA: Insufficient documentation

## 2014-11-28 DIAGNOSIS — R9431 Abnormal electrocardiogram [ECG] [EKG]: Secondary | ICD-10-CM | POA: Diagnosis not present

## 2014-11-28 DIAGNOSIS — E669 Obesity, unspecified: Secondary | ICD-10-CM | POA: Diagnosis not present

## 2014-11-28 DIAGNOSIS — E119 Type 2 diabetes mellitus without complications: Secondary | ICD-10-CM | POA: Insufficient documentation

## 2014-11-28 DIAGNOSIS — Z87448 Personal history of other diseases of urinary system: Secondary | ICD-10-CM | POA: Diagnosis not present

## 2014-11-28 DIAGNOSIS — I10 Essential (primary) hypertension: Secondary | ICD-10-CM | POA: Diagnosis not present

## 2014-11-28 DIAGNOSIS — Z7952 Long term (current) use of systemic steroids: Secondary | ICD-10-CM | POA: Insufficient documentation

## 2014-11-28 DIAGNOSIS — G4730 Sleep apnea, unspecified: Secondary | ICD-10-CM | POA: Diagnosis not present

## 2014-11-28 DIAGNOSIS — R509 Fever, unspecified: Secondary | ICD-10-CM | POA: Diagnosis not present

## 2014-11-28 DIAGNOSIS — M1990 Unspecified osteoarthritis, unspecified site: Secondary | ICD-10-CM | POA: Diagnosis not present

## 2014-11-28 DIAGNOSIS — I2510 Atherosclerotic heart disease of native coronary artery without angina pectoris: Secondary | ICD-10-CM | POA: Diagnosis not present

## 2014-11-28 DIAGNOSIS — Z87891 Personal history of nicotine dependence: Secondary | ICD-10-CM | POA: Diagnosis not present

## 2014-11-28 DIAGNOSIS — Z794 Long term (current) use of insulin: Secondary | ICD-10-CM | POA: Diagnosis not present

## 2014-11-28 DIAGNOSIS — Z79899 Other long term (current) drug therapy: Secondary | ICD-10-CM | POA: Insufficient documentation

## 2014-11-28 DIAGNOSIS — F419 Anxiety disorder, unspecified: Secondary | ICD-10-CM | POA: Diagnosis not present

## 2014-11-28 LAB — URINALYSIS, ROUTINE W REFLEX MICROSCOPIC
Bilirubin Urine: NEGATIVE
Glucose, UA: NEGATIVE mg/dL
Hgb urine dipstick: NEGATIVE
Ketones, ur: NEGATIVE mg/dL
Leukocytes, UA: NEGATIVE
Nitrite: NEGATIVE
Protein, ur: NEGATIVE mg/dL
Specific Gravity, Urine: 1.008 (ref 1.005–1.030)
Urobilinogen, UA: 0.2 mg/dL (ref 0.0–1.0)
pH: 5 (ref 5.0–8.0)

## 2014-11-28 LAB — COMPREHENSIVE METABOLIC PANEL
ALT: 33 U/L (ref 17–63)
AST: 31 U/L (ref 15–41)
Albumin: 3.2 g/dL — ABNORMAL LOW (ref 3.5–5.0)
Alkaline Phosphatase: 85 U/L (ref 38–126)
Anion gap: 10 (ref 5–15)
BUN: 59 mg/dL — ABNORMAL HIGH (ref 6–20)
CO2: 24 mmol/L (ref 22–32)
Calcium: 8.6 mg/dL — ABNORMAL LOW (ref 8.9–10.3)
Chloride: 99 mmol/L — ABNORMAL LOW (ref 101–111)
Creatinine, Ser: 2.58 mg/dL — ABNORMAL HIGH (ref 0.61–1.24)
GFR calc Af Amer: 27 mL/min — ABNORMAL LOW (ref >60)
GFR calc non Af Amer: 23 mL/min — ABNORMAL LOW (ref >60)
Glucose, Bld: 214 mg/dL — ABNORMAL HIGH (ref 65–99)
Potassium: 4.3 mmol/L (ref 3.5–5.1)
Sodium: 133 mmol/L — ABNORMAL LOW (ref 135–145)
Total Bilirubin: 0.2 mg/dL — ABNORMAL LOW (ref 0.3–1.2)
Total Protein: 6.1 g/dL — ABNORMAL LOW (ref 6.5–8.1)

## 2014-11-28 LAB — CBC WITH DIFFERENTIAL/PLATELET
Basophils Absolute: 0 10*3/uL (ref 0.0–0.1)
Basophils Relative: 0 % (ref 0–1)
Eosinophils Absolute: 0 10*3/uL (ref 0.0–0.7)
Eosinophils Relative: 0 % (ref 0–5)
HCT: 35.3 % — ABNORMAL LOW (ref 39.0–52.0)
Hemoglobin: 12.1 g/dL — ABNORMAL LOW (ref 13.0–17.0)
Lymphocytes Relative: 10 % — ABNORMAL LOW (ref 12–46)
Lymphs Abs: 0.9 10*3/uL (ref 0.7–4.0)
MCH: 30.8 pg (ref 26.0–34.0)
MCHC: 34.3 g/dL (ref 30.0–36.0)
MCV: 89.8 fL (ref 78.0–100.0)
Monocytes Absolute: 0.9 10*3/uL (ref 0.1–1.0)
Monocytes Relative: 9 % (ref 3–12)
Neutro Abs: 7.3 10*3/uL (ref 1.7–7.7)
Neutrophils Relative %: 81 % — ABNORMAL HIGH (ref 43–77)
Platelets: 120 10*3/uL — ABNORMAL LOW (ref 150–400)
RBC: 3.93 MIL/uL — ABNORMAL LOW (ref 4.22–5.81)
RDW: 14.2 % (ref 11.5–15.5)
WBC: 9.1 10*3/uL (ref 4.0–10.5)

## 2014-11-28 LAB — I-STAT TROPONIN, ED: Troponin i, poc: 0.05 ng/mL (ref 0.00–0.08)

## 2014-11-28 MED ORDER — SODIUM CHLORIDE 0.9 % IV BOLUS (SEPSIS)
500.0000 mL | Freq: Once | INTRAVENOUS | Status: AC
  Administered 2014-11-28: 500 mL via INTRAVENOUS

## 2014-11-28 NOTE — ED Provider Notes (Signed)
CSN: [id number]     Arrival date & time 11/28/14  1053 History   First MD Initiated Contact with Patient 11/28/14 1208     Chief Complaint  Patient presents with  . Fever     (Consider location/radiation/quality/duration/timing/severity/associated sxs/prior Treatment) Patient is a 74 y.o. male presenting with fever. The history is provided by the patient.  Fever Max temp prior to arrival:  100.3 Temp source:  Oral Severity:  Mild Onset quality:  Gradual Duration:  1 day Timing:  Constant Progression:  Unchanged Chronicity:  New Relieved by:  Acetaminophen Worsened by:  Nothing tried Ineffective treatments:  None tried Associated symptoms: myalgias   Associated symptoms: no chest pain, no cough, no diarrhea, no dysuria, no headaches, no nausea, no rhinorrhea and no vomiting   Myalgias:    Location:  Generalized   Quality:  Aching   Severity:  Mild   Onset quality:  Gradual   Duration:  1 day   Timing:  Intermittent   Progression:  Resolved   Past Medical History  Diagnosis Date  . Hypertension   . CAD (coronary artery disease)   . Renal insufficiency   . Palpitations   . Dyspnea   . Sinus bradycardia   . Obesity   . Allergy   . Kidney failure   . Arthritis   . Anxiety   . Cancer     skin  . Sleep apnea     uses CPAP nightly  . Diabetes     IDDM on pump  . Diabetes    Past Surgical History  Procedure Laterality Date  . Insert / replace / remove pacemaker    . Knee surgery    . Vein ligation and stripping    . 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 Orif ankle fracture Left 11/06/2014    Procedure: OPEN REDUCTION INTERNAL FIXATION (ORIF) LEFT  ANKLE FRACTURE;  Surgeon: [name], MD;  Location: [place];  Service: Orthopedics;  Laterality: Left;   Family History  Problem Relation Age of Onset  . Cancer Mother     breast  . Heart attack Father    History  Substance Use Topics  . Smoking status: Former Smoker -- 2.00  packs/day for 30 years  . Smokeless tobacco: Never Used  . Alcohol Use: 1.2 oz/week    2 Glasses of wine per week     Comment: social    Review of Systems  Constitutional: Positive for fever.  HENT: Negative for drooling and rhinorrhea.   Eyes: Negative for pain.  Respiratory: Negative for cough and shortness of breath.   Cardiovascular: Negative for chest pain and leg swelling.  Gastrointestinal: Negative for nausea, vomiting, abdominal pain and diarrhea.  Genitourinary: Negative for dysuria and hematuria.  Musculoskeletal: Positive for myalgias. Negative for gait problem and neck pain.  Skin: Negative for color change.  Neurological: Negative for numbness and headaches.  Hematological: Negative for adenopathy.  Psychiatric/Behavioral: Negative for behavioral problems.  All other systems reviewed and are negative.     Allergies  Review of patient's allergies indicates no known allergies.  Home Medications   Prior to Admission medications   Medication Sig Start Date End Date Taking? Authorizing Provider  acetaminophen (TYLENOL) 500 MG tablet Take 1,000 mg by mouth every 6 (six) hours as needed for fever.    Historical Provider, MD  aspirin EC 325 MG tablet Take 1 tablet (325 mg total) by mouth daily. 11/06/14  Marya Amsler Ollis, PA-C  B-D ULTRAFINE III SHORT PEN 31G X 8 MM MISC  07/29/13   Historical Provider, MD  calcitRIOL (ROCALTROL) 0.25 MCG capsule Take 0.25 mcg by mouth daily.  05/30/14   Historical Provider, MD  carvedilol (COREG) 25 MG tablet Take 1 tablet (25 mg total) by mouth 2 (two) times daily with a meal. 08/04/14   [name], FNP  docusate sodium (COLACE) 100 MG capsule Take 1 capsule (100 mg total) by mouth 2 (two) times daily. While taking narcotic pain medicine. 11/06/14   Corky Sing, PA-C  doxazosin (CARDURA) 4 MG tablet Take 1 tablet (4 mg total) by mouth daily. 11/04/14   [name], FNP  glucose blood (BAYER CONTOUR NEXT TEST) test strip Use  as instructed to check blood sugar 6 times per day dx code E11.69, patient is on insulin pump and is medically required to check 6 times per day. 08/28/14   Elayne Snare, MD  insulin aspart (NOVOLOG) 100 UNIT/ML injection Use max 120 units per day with insulin pump. 10/15/14   Elayne Snare, MD  isosorbide mononitrate (IMDUR) 30 MG 24 hr tablet TAKE TWO TABLETS BY MOUTH ONCE DAILY 12/24/13   [name], MD  Liraglutide (VICTOZA) 18 MG/3ML SOPN Inject 1.8 mg into the skin daily. Inject once daily at the same time 04/28/14   Elayne Snare, MD  lisinopril (PRINIVIL,ZESTRIL) 10 MG tablet Take 1 tablet (10 mg total) by mouth daily. Resume in 2-3 days. 10/31/14   Eugenie Filler, MD  LORazepam (ATIVAN) 1 MG tablet 1/2 q 12 hrs prn only; high fall risk 11/14/14   Hendricks Limes, MD  neomycin-polymyxin-hydrocortisone (CORTISPORIN) otic solution Place 3 drops into the left ear 4 (four) times daily. 08/05/14   [name], FNP  Omega-3 Fatty Acids (FISH OIL) 1000 MG CAPS Take by mouth 2 (two) times daily.    Historical Provider, MD  oxyCODONE (ROXICODONE) 5 MG immediate release tablet Take 1-2 tablets (5-10 mg total) by mouth every 4 (four) hours as needed for moderate pain or severe pain. 11/06/14   Marya Amsler Ollis, PA-C  pantoprazole (PROTONIX) 40 MG tablet Take 1 tablet (40 mg total) by mouth daily. 08/04/14   [name], FNP  sertraline (ZOLOFT) 50 MG tablet 1/2 qd 11/14/14   Hendricks Limes, MD  torsemide (DEMADEX) 20 MG tablet Take 2 tablets (40 mg total) by mouth daily. Resume in 2-3 days. 10/31/14   Eugenie Filler, MD   BP 147/66 mmHg  Pulse 64  Temp(Src) 98.7 F (37.1 C) (Oral)  Resp 16  SpO2 96% Physical Exam  Constitutional: He is oriented to person, place, and time. He appears well-developed and well-nourished.  HENT:  Head: Normocephalic and atraumatic.  Right Ear: External ear normal.  Left Ear: External ear normal.  Nose: Nose normal.  Mouth/Throat: Oropharynx is clear and moist.  No oropharyngeal exudate.  Eyes: Conjunctivae and EOM are normal. Pupils are equal, round, and reactive to light.  Neck: Normal range of motion. Neck supple.  Cardiovascular: Normal rate, regular rhythm, normal heart sounds and intact distal pulses.  Exam reveals no gallop and no friction rub.   No murmur heard. Pulmonary/Chest: Effort normal and breath sounds normal. No respiratory distress. He has no wheezes.  Abdominal: Soft. Bowel sounds are normal. He exhibits no distension. There is no tenderness. There is no rebound and no guarding.  Musculoskeletal: Normal range of motion. He exhibits no edema or tenderness.  Neurological: He is alert and oriented to person, place, and time. He has normal strength. No sensory deficit. Coordination and gait normal.  Skin: Skin is warm and dry.  Psychiatric: He has a normal mood and affect. His behavior is normal.  Nursing note and vitals reviewed.   ED Course  Procedures (including critical care time) Labs Review Labs Reviewed  CBC WITH DIFFERENTIAL/PLATELET - Abnormal; Notable for the following:    RBC 3.93 (*)    Hemoglobin 12.1 (*)    HCT 35.3 (*)    Platelets 120 (*)    Neutrophils Relative % 81 (*)    Lymphocytes Relative 10 (*)    All other components within normal limits  COMPREHENSIVE METABOLIC PANEL - Abnormal; Notable for the following:    Sodium 133 (*)    Chloride 99 (*)    Glucose, Bld 214 (*)    BUN 59 (*)    Creatinine, Ser 2.58 (*)    Calcium 8.6 (*)    Total Protein 6.1 (*)    Albumin 3.2 (*)    Total Bilirubin 0.2 (*)    GFR calc non Af Amer 23 (*)    GFR calc Af Amer 27 (*)    All other components within normal limits  URINALYSIS, ROUTINE W REFLEX MICROSCOPIC (NOT AT [facility])  Randolm Idol, ED    Imaging Review Dg Chest 2 View  11/28/2014   CLINICAL DATA:  Fever.  Generalized body aches.  Short of breath.  EXAM: CHEST  2 VIEW  COMPARISON:  None.  FINDINGS: Cardiopericardial silhouette is enlarged. Dual lead LEFT  subclavian cardiac pacemaker. Frontal projection is apical lordotic. Flattening of the hemidiaphragms is present on the lateral view compatible with emphysema. No focal airspace consolidation. No pleural effusion. Aortic arch atherosclerosis.  IMPRESSION: Cardiomegaly without failure.   Electronically Signed   By: Dereck Ligas [name]   On: 11/28/2014 13:54     EKG Interpretation   Date/Time:  Friday November 28 2014 11:09:50 EDT Ventricular Rate:  72 PR Interval:  256 QRS Duration: 124 QT Interval:  466 QTC Calculation: 510 R Axis:   -42 Text Interpretation:  Atrial-paced rhythm with prolonged AV conduction  Left axis deviation Septal infarct , age undetermined T wave abnormality,  consider lateral ischemia Abnormal ECG Confirmed by Terrin Imparato  MD, Mikenzie Mccannon  ([id number]) on 11/28/2014 12:57:18 PM      MDM   Final diagnoses:  Fever    1:27 PM 74 y.o. male w hx of CAD, renal insuff, HTN, DM who presents with body aches and temperature 100.3 since yesterday. He also had a bitemporal headache which began yesterday but has since completely resolved. He has no headache at all today. He denies any cough or GU symptoms. He denies cp or sob. He is afebrile and vital signs are unremarkable here. We'll get screening imaging and lab work.  Of note the patient states that he had surgery on his left ankle for a fracture last month. He was also admitted last month for cellulitis of his right lower extremity. 2+ distal pulses in bilateral lower extremities on exam. No evidence of cellulitis on his lower extremities.  4:29 PM: I interpreted/reviewed the labs and/or imaging which were non-contributory.  Pt continues to appear well. VS unremarkable. He feels good. Possibly viral syndrome.  I have discussed the diagnosis/risks/treatment options with the patient and family and believe the pt to be eligible for discharge home to follow-up with his pcp. We also discussed returning to the ED  immediately if new or worsening  sx occur. We discussed the sx which are most concerning (e.g., feeling worse, intractable fever, vomiting, cp, sob, cough, return of HA) that necessitate immediate return. Medications administered to the patient during their visit and any new prescriptions provided to the patient are listed below.  Medications given during this visit Medications  sodium chloride 0.9 % bolus 500 mL (0 mLs Intravenous Stopped 11/28/14 1430)    New Prescriptions   No medications on file     Pamella Pert, MD 11/29/14 [id number]

## 2014-11-28 NOTE — Discharge Instructions (Signed)

## 2014-11-28 NOTE — Telephone Encounter (Signed)
[place] [facility]  Patient Name: David [name]  DOB: 1940/08/13    Initial Comment Caller states husband has had low grade fever since yesterday. 99.4, SOB, dealing with broken ankle.   Nurse Assessment  Nurse: Wynetta Emery, RN, Baker Janus Date/Time Eilene Ghazi Time): 11/28/2014 9:25:57 AM  Confirm and document reason for call. If symptomatic, describe symptoms. ---[name] has a cast on ankle d/t broken ankle surgery on ankle in 10/2014 Today presents with shortness of breath and low grade fever of 99.8 which started yesterday  Has the patient traveled out of the country within the last 30 days? ---No  Does the patient require triage? ---Yes  Related visit to physician within the last 2 weeks? ---Yes   Hospitalization for cellulitis broke ankle in hospital 2nd night stay surgery in end of June  Does the PT have any chronic conditions? (i.e. diabetes, asthma, etc.) ---Unknown     Guidelines    Guideline Title Affirmed Question Affirmed Notes  Breathing Difficulty Major surgery in the past month    Final Disposition User   Go to ED Now Wynetta Emery, RN, Baker Janus    Comments  Attempted to make appt but none available, advised that he needs to be seen in ED when asked why, nurse advised he could be having a problem with cast/ankle and/or he could be having PE needs to go to ED   Referrals  [name] UNDECIDED   Disagree/Comply: Comply

## 2014-11-28 NOTE — ED Notes (Signed)
Pt reports fever since yesterday as high as 100.3. Pt reports generalized body aches and SOB. Pt has been taking OTC medications

## 2014-12-08 ENCOUNTER — Ambulatory Visit (INDEPENDENT_AMBULATORY_CARE_PROVIDER_SITE_OTHER): Payer: Medicare Other

## 2014-12-08 DIAGNOSIS — I495 Sick sinus syndrome: Secondary | ICD-10-CM

## 2014-12-11 LAB — CUP PACEART REMOTE DEVICE CHECK
Battery Impedance: 3946 Ohm
Battery Remaining Longevity: 9 mo
Battery Voltage: 2.66 V
Brady Statistic AP VP Percent: 30 %
Brady Statistic AP VS Percent: 67 %
Brady Statistic AS VP Percent: 3 %
Brady Statistic AS VS Percent: 1 %
Date Time Interrogation Session: 20160724185829
Lead Channel Impedance Value: 572 Ohm
Lead Channel Impedance Value: 596 Ohm
Lead Channel Pacing Threshold Amplitude: 0.75 V
Lead Channel Pacing Threshold Pulse Width: 0.4 ms
Lead Channel Sensing Intrinsic Amplitude: 16 mV
Lead Channel Setting Pacing Amplitude: 2 V
Lead Channel Setting Pacing Amplitude: 2.5 V
Lead Channel Setting Pacing Pulse Width: 0.4 ms
Lead Channel Setting Sensing Sensitivity: 5.6 mV

## 2014-12-14 ENCOUNTER — Other Ambulatory Visit: Payer: Self-pay | Admitting: Family

## 2014-12-15 ENCOUNTER — Encounter: Payer: Self-pay | Admitting: Family

## 2014-12-15 ENCOUNTER — Other Ambulatory Visit: Payer: Self-pay

## 2014-12-15 ENCOUNTER — Other Ambulatory Visit (INDEPENDENT_AMBULATORY_CARE_PROVIDER_SITE_OTHER): Payer: Medicare Other

## 2014-12-15 DIAGNOSIS — IMO0002 Reserved for concepts with insufficient information to code with codable children: Secondary | ICD-10-CM

## 2014-12-15 DIAGNOSIS — E118 Type 2 diabetes mellitus with unspecified complications: Secondary | ICD-10-CM | POA: Diagnosis not present

## 2014-12-15 DIAGNOSIS — F4322 Adjustment disorder with anxiety: Secondary | ICD-10-CM

## 2014-12-15 DIAGNOSIS — E1165 Type 2 diabetes mellitus with hyperglycemia: Secondary | ICD-10-CM

## 2014-12-15 LAB — BASIC METABOLIC PANEL
BUN: 40 mg/dL — ABNORMAL HIGH (ref 6–23)
CO2: 25 mEq/L (ref 19–32)
Calcium: 9.3 mg/dL (ref 8.4–10.5)
Chloride: 103 mEq/L (ref 96–112)
Creatinine, Ser: 1.86 mg/dL — ABNORMAL HIGH (ref 0.40–1.50)
GFR: 37.9 mL/min — ABNORMAL LOW (ref >60.00)
Glucose, Bld: 215 mg/dL — ABNORMAL HIGH (ref 70–99)
Potassium: 4.7 mEq/L (ref 3.5–5.1)
Sodium: 137 mEq/L (ref 135–145)

## 2014-12-15 LAB — HEMOGLOBIN A1C: Hgb A1c MFr Bld: 8.7 % — ABNORMAL HIGH (ref 4.6–6.5)

## 2014-12-15 MED ORDER — CARVEDILOL 25 MG PO TABS: 25.0000 mg | ORAL_TABLET | Freq: Two times a day (BID) | ORAL | Status: DC

## 2014-12-15 MED ORDER — LISINOPRIL 10 MG PO TABS
10.0000 mg | ORAL_TABLET | Freq: Every day | ORAL | Status: DC
Start: 2014-12-15 — End: 2015-05-26

## 2014-12-15 MED ORDER — SERTRALINE HCL 50 MG PO TABS: ORAL_TABLET | ORAL | Status: DC

## 2014-12-16 ENCOUNTER — Other Ambulatory Visit: Payer: Self-pay

## 2014-12-16 DIAGNOSIS — F4322 Adjustment disorder with anxiety: Secondary | ICD-10-CM

## 2014-12-16 MED ORDER — SERTRALINE HCL 50 MG PO TABS: ORAL_TABLET | ORAL | Status: DC

## 2014-12-18 ENCOUNTER — Encounter: Payer: Self-pay | Admitting: Endocrinology

## 2014-12-18 ENCOUNTER — Ambulatory Visit (INDEPENDENT_AMBULATORY_CARE_PROVIDER_SITE_OTHER): Payer: Medicare Other | Admitting: Endocrinology

## 2014-12-18 VITALS — BP 118/68 | HR 69 | Temp 98.7°F | Resp 16 | Ht 76.0 in | Wt 269.0 lb

## 2014-12-18 DIAGNOSIS — E782 Mixed hyperlipidemia: Secondary | ICD-10-CM | POA: Diagnosis not present

## 2014-12-18 DIAGNOSIS — E1129 Type 2 diabetes mellitus with other diabetic kidney complication: Secondary | ICD-10-CM

## 2014-12-18 DIAGNOSIS — N183 Chronic kidney disease, stage 3 unspecified: Secondary | ICD-10-CM

## 2014-12-18 DIAGNOSIS — IMO0002 Reserved for concepts with insufficient information to code with codable children: Secondary | ICD-10-CM

## 2014-12-18 DIAGNOSIS — E1165 Type 2 diabetes mellitus with hyperglycemia: Secondary | ICD-10-CM | POA: Diagnosis not present

## 2014-12-18 NOTE — Progress Notes (Signed)
Patient ID: David [name], male   DOB: 19-Jul-1940, 74 y.o.   MRN: [id number]    Reason for Appointment: Followup for Type 2 Diabetes  Referring physician: Megan Salon  History of Present Illness:          Diagnosis: Type 2 diabetes mellitus, date of diagnosis:   1992       Past history:  He was initially treated with metformin and at some point also glipizide. His previous records from out of town are not available and no information is available about his level of control Apparently he was started on insulin in 1994 approximately because of poor control He has been on various insulin regimens over the last several years However even with insulin he has had poor control for at least the last 7 or 8 years. He does not know what his previous A1c levels have been. He had been continued on metformin and glipizide but metformin stopped about 2 years ago because of kidney function abnormality He had been taking Lantus 60 units twice a day with NovoLog previously and also Before his initial consultation in 6/15 he was on NovoLog twice a day and Humalog mix insulin  Because of poor control and large insulin doses he was started on Victoza in 6/15  Recent history:   INSULIN PUMP  regimen is as follows:   Basal rates: midnight: 3.0, 6 AM = 3.6, 9 AM = 3.1, 3 PM = 3.0 and 7 PM = 3.4 Carbohydrate coverage 1:7 for lunch otherwise 1:5, correction 1:10 with target 120-150  Average total daily insulin 92 +/-10 units, basal insulin is 81%  Since 04/28/14 he has been on a [employer] insulin pump because of persistent poor control and high insulin requirement Initially with starting the pump his blood sugars had been significantly better but subsequently blood sugars have been again high mostly related to noncompliance He had called to report low blood sugars in the mornings on waking up and his overnight basal rates have been reduced  Current problems and blood sugar patterns:  Checking  blood sugars at least 3 times a day on an average but mostly in the mornings and evenings  He has lost a significant amount of weight especially with his recent hospitalization and his wife watching his diet better  He still thinks that he is afraid of low blood sugars in the mornings and is eating about 6 donut holes at bedtime for fear of hypoglycemia  FASTING blood sugars are somewhat variable and generally not under 140  His blood sugars are somewhat better around lunchtime with a little variability  Blood sugars are trending much higher after about 6 PM and generally averaging over 200 after 8 PM; not clear however if he is checking his readings before or after eating  Although he thinks he is bolusing as directed his wife states that he will take his bolus especially at suppertime sometime after eating  BOLUSES: These are inconsistent and he will miss boluses periodically at meals especially lunch time  Not covering high readings with boluses and occasionally will not correct a reading of even 326 as discussed.  About 80% of his readings are above his target  Appears not to get consistent correction of his high readings back to normal when he boluses for higher readings  He is also continuing to take 1.8 mg Victoza        Oral hypoglycemic drugs the patient is taking are: None, has renal dysfunction  Glucose monitoring:  done about 2-3 times a day         Glucometer:  contour     Blood Glucose readings from pump download:  Mean values apply above for all meters except median for One Touch  PRE-MEAL Fasting Lunch Dinner  PCS  Overall  Glucose range:  79-326   82-243   148-306   219    Mean/median:  175    251    185+/-60    Hypoglycemia: None recently  Glycemic control:   Lab Results  Component Value Date   HGBA1C 8.7* 12/15/2014   HGBA1C 11.5* 10/26/2014   HGBA1C 8.7* 09/12/2014   Lab Results  Component Value Date   MICROALBUR 18.8* 11/20/2013   LDLCALC 49  10/29/2013   CREATININE 1.86* 12/15/2014    Self-care: The diet that the patient has been following is: tries to limit fat intake   Meals: 3 meals per day.breakfast is either cereal or eggs and toast, lunch is usually a sandwich , dinner at  6-7 pm        Exercise:  not doing exercise bike, previously  was doing 15-30 min        Dietician visit: Most recent: several years ago.               Compliance with the medical regimen: good Retinal exam: Most recent: 05/2013.    Weight history:  Wt Readings from Last 3 Encounters:  12/18/14 269 lb (122.018 kg)  11/06/14 275 lb (124.739 kg)  10/27/14 292 lb 1.8 oz (132.5 kg)      Medication List       This list is accurate as of: 12/18/14 11:59 PM.  Always use your most recent med list.               acetaminophen 500 MG tablet  Commonly known as:  TYLENOL  Take 1,000 mg by mouth every 6 (six) hours as needed for fever.     aspirin EC 325 MG tablet  Take 1 tablet (325 mg total) by mouth daily.     B-D ULTRAFINE III SHORT PEN 31G X 8 MM Misc  Generic drug:  Insulin Pen Needle     calcitRIOL 0.25 MCG capsule  Commonly known as:  ROCALTROL  Take 0.25 mcg by mouth daily.     carvedilol 25 MG tablet  Commonly known as:  COREG  Take 1 tablet (25 mg total) by mouth 2 (two) times daily with a meal.     docusate sodium 100 MG capsule  Commonly known as:  COLACE  Take 1 capsule (100 mg total) by mouth 2 (two) times daily. While taking narcotic pain medicine.     doxazosin 4 MG tablet  Commonly known as:  CARDURA  Take 1 tablet (4 mg total) by mouth daily.     Fish Oil 1000 MG Caps  Take by mouth 2 (two) times daily.     glucose blood test strip  Commonly known as:  BAYER CONTOUR NEXT TEST  Use as instructed to check blood sugar 6 times per day dx code E11.69, patient is on insulin pump and is medically required to check 6 times per day.     insulin aspart 100 UNIT/ML injection  Commonly known as:  NOVOLOG  Use max 120 units  per day with insulin pump.     isosorbide mononitrate 30 MG 24 hr tablet  Commonly known as:  IMDUR  TAKE TWO TABLETS BY MOUTH ONCE DAILY  Liraglutide 18 MG/3ML Sopn  Commonly known as:  VICTOZA  Inject 1.8 mg into the skin daily. Inject once daily at the same time     lisinopril 10 MG tablet  Commonly known as:  PRINIVIL,ZESTRIL  Take 1 tablet (10 mg total) by mouth daily.     LORazepam 1 MG tablet  Commonly known as:  ATIVAN  1/2 q 12 hrs prn only; high fall risk     neomycin-polymyxin-hydrocortisone otic solution  Commonly known as:  CORTISPORIN  Place 3 drops into the left ear 4 (four) times daily.     oxyCODONE 5 MG immediate release tablet  Commonly known as:  ROXICODONE  Take 1-2 tablets (5-10 mg total) by mouth every 4 (four) hours as needed for moderate pain or severe pain.     pantoprazole 40 MG tablet  Commonly known as:  PROTONIX  Take 1 tablet (40 mg total) by mouth daily.     sertraline 50 MG tablet  Commonly known as:  ZOLOFT  Take 1 tablet (50 mg) daily     torsemide 20 MG tablet  Commonly known as:  DEMADEX  Take 2 tablets (40 mg total) by mouth daily. Resume in 2-3 days.        Allergies: No Known Allergies  Past Medical History  Diagnosis Date  . Hypertension   . CAD (coronary artery disease)   . Renal insufficiency   . Palpitations   . Dyspnea   . Sinus bradycardia   . Obesity   . Allergy   . Kidney failure   . Arthritis   . Anxiety   . Cancer     skin  . Sleep apnea     uses CPAP nightly  . Diabetes     IDDM on pump  . Diabetes     Past Surgical History  Procedure Laterality Date  . Insert / replace / remove pacemaker    . Knee surgery    . Vein ligation and stripping    . 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 Orif ankle fracture Left 11/06/2014    Procedure: OPEN REDUCTION INTERNAL FIXATION (ORIF) LEFT  ANKLE FRACTURE;  Surgeon: [name], MD;  Location: [facility];  Service: Orthopedics;   Laterality: Left;    Family History  Problem Relation Age of Onset  . Cancer Mother     breast  . Heart attack Father     Social History:  reports that he has quit smoking. He has never used smokeless tobacco. He reports that he drinks about 1.2 oz of alcohol per week. He reports that he does not use illicit drugs.    Review of Systems   He is wearing a left foot boot for his recent ankle fracture      Lipids: Had been on a Lipitor since late 2014. Also had taken Gembrozil for several years without apparent side effects.  Both of these were  stopped because of increased liver functions and LFT has been back to normal  LDL has not been high subsequently but HDL is low Currently taking only fish oil Currently lipids are followed by PCP       Lab Results  Component Value Date   CHOL 159 09/12/2014   HDL 26.20* 09/12/2014   LDLCALC 49 10/29/2013   LDLDIRECT 77.0 09/12/2014   TRIG 206.0* 09/12/2014   CHOLHDL 6 09/12/2014      HYPERTENSION: The blood pressure has been treated  with various drugs.   Currently taking 2 mg doxazosin, carvedilol, lisinopril and amlodipine  Blood pressure is relatively lower  today  Chronic kidney disease: Etiology unknown, is followed by nephrologist  Creatinine level has been variable, higher last month when he was in the ER Last creatinine:  Lab Results  Component Value Date   CREATININE 1.86* 12/15/2014        Has history of Numbness in his feet  since about 2013, no tingling or burning in feet       He has been seeing the podiatrist for a toe ulcer  Previous foot exam findings:   Vibration sense is absent in toes. Ankle jerks are absent bilaterally.          Diabetic foot exam:  No callus formation. Mild denudation of skin on left second toe superiorly Amputation of right third toe. Thickening and dryness of skin on the toes on the right Absent monofilament sensation in the feet Absent pedal pulses     Physical Examination:  BP  118/68 mmHg  Pulse 69  Temp(Src) 98.7 F (37.1 C) (Oral)  Resp 16  Ht 6\' 4"  (1.93 m)  Wt 269 lb (122.018 kg)  BMI 32.76 kg/m2  SpO2 93%   ASSESSMENT:  Diabetes type 2, uncontrolled with neuropathy. His blood sugars are overall not consistently controlled with A1c still over 8% See history of present illness for detailed discussion of his current management, blood sugar patterns and problems identified However with his weight loss he has required somewhat lower basal rates overnight when he was starting to get low blood sugar He still has tendency to low normal readings fasting at times and mostly higher readings around supper time, some of these may be postprandial but difficult to be sure  His compliance with glucose monitoring and improving but he has poor compliance with taking his boluses right at mealtimes especially in the evening; also not always bolusing for lunch He is still afraid of early morning hypoglycemia and is eating sweets at bedtime regularly now  PLAN:   Pump settings will be changed as follows: 7 AM = 3.6 and 3 PM = 3.2  Check blood sugars more often at night including when having snacks  More consistent boluses for all meals and snacks and moist boluses right at mealtime  Take boluses for all high readings regardless of whether he is eating or not  Reviewed instructions with patient and his wife  Hypertension: Blood pressure is much better now Continue follow-up with nephrologist for renal dysfunction  Counseling time on subjects discussed above is over 50% of today's 25 minute visit   Patient Instructions   Must bolus RIGHT AT MEALTIME at all times even if not checking sugar  Avoid snacks at night  Bolus for all snacks over 15g carbs  Lab on 12/15/2014  Component Date Value Ref Range Status  . Hgb A1c MFr Bld 12/15/2014 8.7* 4.6 - 6.5 % Final   Glycemic Control Guidelines for People with Diabetes:Non Diabetic:  <6%Goal of Therapy:  <7%Additional Action Suggested:  >8%   . Sodium 12/15/2014 137  135 - 145 mEq/L Final  . Potassium 12/15/2014 4.7  3.5 - 5.1 mEq/L Final  . Chloride 12/15/2014 103  96 - 112 mEq/L Final  . CO2 12/15/2014 25  19 - 32 mEq/L Final  . Glucose, Bld 12/15/2014 215* 70 - 99 mg/dL Final  . BUN 12/15/2014 40* 6 - 23 mg/dL Final  . Creatinine, Ser 12/15/2014 1.86* 0.40 -  1.50 mg/dL Final  . Calcium 12/15/2014 9.3  8.4 - 10.5 mg/dL Final  . GFR 12/15/2014 37.90* >60.00 mL/min Final       Talibah Colasurdo 12/19/2014, 8:05 AM   Note: This office note was prepared with Estate agent. Any transcriptional errors that result from this process are unintentional.

## 2014-12-18 NOTE — Patient Instructions (Signed)
Must bolus RIGHT AT MEALTIME at all times even if not checking sugar  Avoid snacks at night  Bolus for all snacks over 15g carbs  Lab on 12/15/2014  Component Date Value Ref Range Status  . Hgb A1c MFr Bld 12/15/2014 8.7* 4.6 - 6.5 % Final   Glycemic Control Guidelines for People with Diabetes:Non Diabetic:  <6%Goal of Therapy: <7%Additional Action Suggested:  >8%   . Sodium 12/15/2014 137  135 - 145 mEq/L Final  . Potassium 12/15/2014 4.7  3.5 - 5.1 mEq/L Final  . Chloride 12/15/2014 103  96 - 112 mEq/L Final  . CO2 12/15/2014 25  19 - 32 mEq/L Final  . Glucose, Bld 12/15/2014 215* 70 - 99 mg/dL Final  . BUN 12/15/2014 40* 6 - 23 mg/dL Final  . Creatinine, Ser 12/15/2014 1.86* 0.40 - 1.50 mg/dL Final  . Calcium 12/15/2014 9.3  8.4 - 10.5 mg/dL Final  . GFR 12/15/2014 37.90* >60.00 mL/min Final

## 2014-12-23 ENCOUNTER — Other Ambulatory Visit: Payer: Self-pay | Admitting: Family

## 2014-12-23 DIAGNOSIS — S96891D Other specified injury of other specified muscles and tendons at ankle and foot level, right foot, subsequent encounter: Secondary | ICD-10-CM | POA: Diagnosis not present

## 2014-12-23 DIAGNOSIS — S96902D Unspecified injury of unspecified muscle and tendon at ankle and foot level, left foot, subsequent encounter: Secondary | ICD-10-CM | POA: Diagnosis not present

## 2014-12-23 DIAGNOSIS — L03115 Cellulitis of right lower limb: Secondary | ICD-10-CM | POA: Diagnosis not present

## 2014-12-23 DIAGNOSIS — S96901D Unspecified injury of unspecified muscle and tendon at ankle and foot level, right foot, subsequent encounter: Secondary | ICD-10-CM | POA: Diagnosis not present

## 2014-12-23 DIAGNOSIS — S82892D Other fracture of left lower leg, subsequent encounter for closed fracture with routine healing: Secondary | ICD-10-CM | POA: Diagnosis not present

## 2014-12-23 DIAGNOSIS — S82402D Unspecified fracture of shaft of left fibula, subsequent encounter for closed fracture with routine healing: Secondary | ICD-10-CM | POA: Diagnosis not present

## 2014-12-23 MED ORDER — NEOMYCIN-POLYMYXIN-HC 3.5-10000-1 OT SOLN: 3.0000 [drp] | Freq: Four times a day (QID) | OTIC | Status: DC

## 2014-12-30 ENCOUNTER — Encounter: Payer: Self-pay | Admitting: Cardiology

## 2015-01-06 DIAGNOSIS — S82842D Displaced bimalleolar fracture of left lower leg, subsequent encounter for closed fracture with routine healing: Secondary | ICD-10-CM | POA: Diagnosis not present

## 2015-01-08 ENCOUNTER — Encounter: Payer: Self-pay | Admitting: Internal Medicine

## 2015-01-08 DIAGNOSIS — S82842D Displaced bimalleolar fracture of left lower leg, subsequent encounter for closed fracture with routine healing: Secondary | ICD-10-CM | POA: Diagnosis not present

## 2015-01-12 ENCOUNTER — Telehealth: Payer: Self-pay | Admitting: Family

## 2015-01-12 NOTE — Telephone Encounter (Signed)
Stephanie from [facility] faxed something over last week for Dr. Linna Darner to sign and she is checking up on it. Please call her at [phone]

## 2015-01-14 DIAGNOSIS — S82842D Displaced bimalleolar fracture of left lower leg, subsequent encounter for closed fracture with routine healing: Secondary | ICD-10-CM | POA: Diagnosis not present

## 2015-01-16 DIAGNOSIS — S82842D Displaced bimalleolar fracture of left lower leg, subsequent encounter for closed fracture with routine healing: Secondary | ICD-10-CM | POA: Diagnosis not present

## 2015-01-23 DIAGNOSIS — S82842D Displaced bimalleolar fracture of left lower leg, subsequent encounter for closed fracture with routine healing: Secondary | ICD-10-CM | POA: Diagnosis not present

## 2015-01-26 ENCOUNTER — Encounter: Payer: Self-pay | Admitting: Family

## 2015-01-26 DIAGNOSIS — S82842D Displaced bimalleolar fracture of left lower leg, subsequent encounter for closed fracture with routine healing: Secondary | ICD-10-CM | POA: Diagnosis not present

## 2015-01-26 MED ORDER — TORSEMIDE 20 MG PO TABS: 40.0000 mg | ORAL_TABLET | Freq: Every day | ORAL | Status: DC

## 2015-02-03 ENCOUNTER — Ambulatory Visit (INDEPENDENT_AMBULATORY_CARE_PROVIDER_SITE_OTHER): Payer: Medicare Other | Admitting: Family

## 2015-02-03 ENCOUNTER — Encounter: Payer: Self-pay | Admitting: Family

## 2015-02-03 VITALS — BP 112/60 | HR 61 | Temp 98.2°F | Resp 18 | Ht 76.0 in | Wt 274.0 lb

## 2015-02-03 DIAGNOSIS — G4733 Obstructive sleep apnea (adult) (pediatric): Secondary | ICD-10-CM | POA: Diagnosis not present

## 2015-02-03 DIAGNOSIS — Z9989 Dependence on other enabling machines and devices: Secondary | ICD-10-CM

## 2015-02-03 NOTE — Progress Notes (Signed)
Subjective:    Patient ID: David [name], male    DOB: 09/06/1940, 74 y.o.   MRN: [id number]  Chief Complaint  Patient presents with  . Advice Only    Needs rx for cpap machine, also had question about aspirin     HPI:  David [name] is a 74 y.o. male who  has a past medical history of Hypertension; CAD (coronary artery disease); Renal insufficiency; Palpitations; Dyspnea; Sinus bradycardia; Obesity; Allergy; Kidney failure; Arthritis; Anxiety; Cancer; Sleep apnea; Diabetes; and Diabetes. and presents today for a follow up office visit.   1.) Obstructive sleep apnea - Previously diagnosed with obstructive sleep apnea and currently maintained with a CPAP machine. Notes that his current machine is about 74 years old and appears to be losing power and pressure. Indicates that he uses the CPAP every night and notes improved sleep when using it. Currently averaging about 8 hours of restful sleep and does occasionally have some daytime sleepiness. Requesting a new machine. Most recent sleep study was performed in 2009 and has been reviewed.    No Known Allergies   Current Outpatient Prescriptions on File Prior to Visit  Medication Sig Dispense Refill  . acetaminophen (TYLENOL) 500 MG tablet Take 1,000 mg by mouth every 6 (six) hours as needed for fever.    Marland Kitchen aspirin EC 325 MG tablet Take 1 tablet (325 mg total) by mouth daily. 42 tablet 0  . B-D ULTRAFINE III SHORT PEN 31G X 8 MM MISC     . calcitRIOL (ROCALTROL) 0.25 MCG capsule Take 0.25 mcg by mouth daily.     . carvedilol (COREG) 25 MG tablet Take 1 tablet (25 mg total) by mouth 2 (two) times daily with a meal. 90 tablet 0  . doxazosin (CARDURA) 4 MG tablet Take 1 tablet (4 mg total) by mouth daily. 90 tablet 1  . glucose blood (BAYER CONTOUR NEXT TEST) test strip Use as instructed to check blood sugar 6 times per day dx code E11.69, patient is on insulin pump and is medically required to check 6 times per day. 200 each 3  . insulin  aspart (NOVOLOG) 100 UNIT/ML injection Use max 120 units per day with insulin pump. 40 mL 5  . isosorbide mononitrate (IMDUR) 30 MG 24 hr tablet TAKE TWO TABLETS BY MOUTH ONCE DAILY 60 tablet 0  . lisinopril (PRINIVIL,ZESTRIL) 10 MG tablet Take 1 tablet (10 mg total) by mouth daily. 90 tablet 0  . neomycin-polymyxin-hydrocortisone (CORTISPORIN) otic solution Place 3 drops into the left ear 4 (four) times daily. 10 mL 0  . Omega-3 Fatty Acids (FISH OIL) 1000 MG CAPS Take by mouth 2 (two) times daily.    . pantoprazole (PROTONIX) 40 MG tablet Take 1 tablet (40 mg total) by mouth daily. 90 tablet 1  . sertraline (ZOLOFT) 50 MG tablet Take 1 tablet (50 mg) daily 90 tablet 0  . torsemide (DEMADEX) 20 MG tablet Take 2 tablets (40 mg total) by mouth daily. 180 tablet 1   No current facility-administered medications on file prior to visit.    Review of Systems  Constitutional: Negative for fever and chills.  Psychiatric/Behavioral: Negative for sleep disturbance.      Objective:    BP 112/60 mmHg  Pulse 61  Temp(Src) 98.2 F (36.8 C) (Oral)  Resp 18  Ht 6\' 4"  (1.93 m)  Wt 274 lb (124.286 kg)  BMI 33.37 kg/m2  SpO2 96% Nursing note and vital signs reviewed.  Physical Exam  Constitutional: He is oriented to person, place, and time. He appears well-developed and well-nourished. No distress.  Cardiovascular: Normal rate, regular rhythm, normal heart sounds and intact distal pulses.   Pulmonary/Chest: Effort normal and breath sounds normal.  Neurological: He is alert and oriented to person, place, and time.  Skin: Skin is warm and dry.  Psychiatric: He has a normal mood and affect. His behavior is normal. Judgment and thought content normal.       Assessment & Plan:   Problem List Items Addressed This Visit      Respiratory   OSA on CPAP - Primary    Previously diagnosed with sleep apnea and currently maintained on CPAP with restful sleep and apparent good compliance. Prescription  written to update CPAP machine to manage his sleep apnea. Recommend continued CPAP usage. Counseled on continued need for weight management through nutrition and physical activity.  Follow up as needed.

## 2015-02-03 NOTE — Patient Instructions (Signed)
Thank you for choosing [facility].  Summary/Instructions:  Sleep Apnea  Sleep apnea is a sleep disorder characterized by abnormal pauses in breathing while you sleep. When your breathing pauses, the level of oxygen in your blood decreases. This causes you to move out of deep sleep and into light sleep. As a result, your quality of sleep is poor, and the system that carries your blood throughout your body (cardiovascular system) experiences stress. If sleep apnea remains untreated, the following conditions can develop:  High blood pressure (hypertension).  Coronary artery disease.  Inability to achieve or maintain an erection (impotence).  Impairment of your thought process (cognitive dysfunction). There are three types of sleep apnea: 1. Obstructive sleep apnea--Pauses in breathing during sleep because of a blocked airway. 2. Central sleep apnea--Pauses in breathing during sleep because the area of the brain that controls your breathing does not send the correct signals to the muscles that control breathing. 3. Mixed sleep apnea--A combination of both obstructive and central sleep apnea. RISK FACTORS The following risk factors can increase your risk of developing sleep apnea:  Being overweight.  Smoking.  Having narrow passages in your nose and throat.  Being of older age.  Being male.  Alcohol use.  Sedative and tranquilizer use.  Ethnicity. Among individuals younger than 35 years, African Americans are at increased risk of sleep apnea. SYMPTOMS   Difficulty staying asleep.  Daytime sleepiness and fatigue.  Loss of energy.  Irritability.  Loud, heavy snoring.  Morning headaches.  Trouble concentrating.  Forgetfulness.  Decreased interest in sex. DIAGNOSIS  In order to diagnose sleep apnea, your caregiver will perform a physical examination. Your caregiver may suggest that you take a home sleep test. Your caregiver may also recommend that you spend the  night in a sleep lab. In the sleep lab, several monitors record information about your heart, lungs, and brain while you sleep. Your leg and arm movements and blood oxygen level are also recorded. TREATMENT The following actions may help to resolve mild sleep apnea:  Sleeping on your side.   Using a decongestant if you have nasal congestion.   Avoiding the use of depressants, including alcohol, sedatives, and narcotics.   Losing weight and modifying your diet if you are overweight. There also are devices and treatments to help open your airway:  Oral appliances. These are custom-made mouthpieces that shift your lower jaw forward and slightly open your bite. This opens your airway.  Devices that create positive airway pressure. This positive pressure "splints" your airway open to help you breathe better during sleep. The following devices create positive airway pressure:  Continuous positive airway pressure (CPAP) device. The CPAP device creates a continuous level of air pressure with an air pump. The air is delivered to your airway through a mask while you sleep. This continuous pressure keeps your airway open.  Nasal expiratory positive airway pressure (EPAP) device. The EPAP device creates positive air pressure as you exhale. The device consists of single-use valves, which are inserted into each nostril and held in place by adhesive. The valves create very little resistance when you inhale but create much more resistance when you exhale. That increased resistance creates the positive airway pressure. This positive pressure while you exhale keeps your airway open, making it easier to breath when you inhale again.  Bilevel positive airway pressure (BPAP) device. The BPAP device is used mainly in patients with central sleep apnea. This device is similar to the CPAP device because it also  uses an air pump to deliver continuous air pressure through a mask. However, with the BPAP machine, the  pressure is set at two different levels. The pressure when you exhale is lower than the pressure when you inhale.  Surgery. Typically, surgery is only done if you cannot comply with less invasive treatments or if the less invasive treatments do not improve your condition. Surgery involves removing excess tissue in your airway to create a wider passage way. Document Released: 04/22/2002 Document Revised: 08/27/2012 Document Reviewed: 09/08/2011 [facility] Patient Information 2015 [place], [place]. This information is not intended to replace advice given to you by your health care provider. Make sure you discuss any questions you have with your health care provider.

## 2015-02-03 NOTE — Progress Notes (Signed)
Pre visit review using our clinic review tool, if applicable. No additional management support is needed unless otherwise documented below in the visit note. 

## 2015-02-03 NOTE — Assessment & Plan Note (Signed)
Previously diagnosed with sleep apnea and currently maintained on CPAP with restful sleep and apparent good compliance. Prescription written to update CPAP machine to manage his sleep apnea. Recommend continued CPAP usage. Counseled on continued need for weight management through nutrition and physical activity.  Follow up as needed.

## 2015-02-04 DIAGNOSIS — L602 Onychogryphosis: Secondary | ICD-10-CM | POA: Diagnosis not present

## 2015-02-04 DIAGNOSIS — L97519 Non-pressure chronic ulcer of other part of right foot with unspecified severity: Secondary | ICD-10-CM | POA: Diagnosis not present

## 2015-02-04 DIAGNOSIS — E1140 Type 2 diabetes mellitus with diabetic neuropathy, unspecified: Secondary | ICD-10-CM | POA: Diagnosis not present

## 2015-02-04 DIAGNOSIS — L84 Corns and callosities: Secondary | ICD-10-CM | POA: Diagnosis not present

## 2015-02-10 DIAGNOSIS — D509 Iron deficiency anemia, unspecified: Secondary | ICD-10-CM | POA: Diagnosis not present

## 2015-02-10 DIAGNOSIS — N183 Chronic kidney disease, stage 3 (moderate): Secondary | ICD-10-CM | POA: Diagnosis not present

## 2015-02-11 ENCOUNTER — Encounter: Payer: Self-pay | Admitting: Family

## 2015-02-11 ENCOUNTER — Ambulatory Visit (INDEPENDENT_AMBULATORY_CARE_PROVIDER_SITE_OTHER): Payer: Medicare Other | Admitting: Family

## 2015-02-11 VITALS — BP 144/84 | HR 65 | Temp 98.3°F | Resp 18 | Ht 76.0 in | Wt 274.0 lb

## 2015-02-11 DIAGNOSIS — H6123 Impacted cerumen, bilateral: Secondary | ICD-10-CM | POA: Diagnosis not present

## 2015-02-11 DIAGNOSIS — E1149 Type 2 diabetes mellitus with other diabetic neurological complication: Secondary | ICD-10-CM | POA: Diagnosis not present

## 2015-02-11 DIAGNOSIS — E1165 Type 2 diabetes mellitus with hyperglycemia: Secondary | ICD-10-CM

## 2015-02-11 DIAGNOSIS — IMO0002 Reserved for concepts with insufficient information to code with codable children: Secondary | ICD-10-CM

## 2015-02-11 NOTE — Assessment & Plan Note (Signed)
Symptoms and exam consistent with cerumen impaction.  Both ears were cleaned out with warm water mixed with Docusate Sodium. There was wax return from both ears and ear drum was visible. Patient tolerated this well. Post care instructions provided.

## 2015-02-11 NOTE — Patient Instructions (Signed)
Thank you for choosing [facility].  Summary/Instructions:  Your prescription(s) have been submitted to your pharmacy or been printed and provided for you. Please take as directed and contact our office if you believe you are having problem(s) with the medication(s) or have any questions.  If your symptoms worsen or fail to improve, please contact our office for further instruction, or in case of emergency go directly to the emergency room at the closest medical facility.   Cerumen Impaction A cerumen impaction is when the wax in your ear forms a plug. This plug usually causes reduced hearing. Sometimes it also causes an earache or dizziness. Removing a cerumen impaction can be difficult and painful. The wax sticks to the ear canal. The canal is sensitive and bleeds easily. If you try to remove a heavy wax buildup with a cotton tipped swab, you may push it in further. Irrigation with water, suction, and small ear curettes may be used to clear out the wax. If the impaction is fixed to the skin in the ear canal, ear drops may be needed for a few days to loosen the wax. People who build up a lot of wax frequently can use ear wax removal products available in your local drugstore. SEEK MEDICAL CARE IF:  You develop an earache, increased hearing loss, or marked dizziness. Document Released: 06/09/2004 Document Revised: 07/25/2011 Document Reviewed: 07/30/2009 [facility] Patient Information 2015 [place], [place]. This information is not intended to replace advice given to you by your health care provider. Make sure you discuss any questions you have with your health care provider.

## 2015-02-11 NOTE — Assessment & Plan Note (Signed)
Given neurological complications, diabetic shoes are appropriate for foot protection. Prescription written and provided to the patient.

## 2015-02-11 NOTE — Progress Notes (Signed)
Subjective:    Patient ID: David [name], male    DOB: 1941/01/08, 74 y.o.   MRN: [id number]  Chief Complaint  Patient presents with  . Cerumen Impaction    left ear impacted, also needs rx for diabetic shoes    HPI:  David [name] is a 74 y.o. male who  has a past medical history of Hypertension; CAD (coronary artery disease); Renal insufficiency; Palpitations; Dyspnea; Sinus bradycardia; Obesity; Allergy; Kidney failure; Arthritis; Anxiety; Cancer; Sleep apnea; Diabetes; and Diabetes. and presents today for an acute office visit.  1.) Impacted cerumen.- Associated symptom of decreased hearing noted in bilateral ears and described as ears feeling stopped up has been going on for several days. Denies any modifying factors or treatments that make his hearing better. Would like to have his ears cleaned if possible.  2.) Request for diabetic shoes - Known diabetic with neurological complications that would benefit from diabetic shoes requests a prescription as insurance requires the request be written from primary care.  No Known Allergies   Current Outpatient Prescriptions on File Prior to Visit  Medication Sig Dispense Refill  . acetaminophen (TYLENOL) 500 MG tablet Take 1,000 mg by mouth every 6 (six) hours as needed for fever.    Marland Kitchen aspirin EC 325 MG tablet Take 1 tablet (325 mg total) by mouth daily. 42 tablet 0  . B-D ULTRAFINE III SHORT PEN 31G X 8 MM MISC     . calcitRIOL (ROCALTROL) 0.25 MCG capsule Take 0.25 mcg by mouth daily.     . carvedilol (COREG) 25 MG tablet Take 1 tablet (25 mg total) by mouth 2 (two) times daily with a meal. 90 tablet 0  . doxazosin (CARDURA) 4 MG tablet Take 1 tablet (4 mg total) by mouth daily. 90 tablet 1  . glucose blood (BAYER CONTOUR NEXT TEST) test strip Use as instructed to check blood sugar 6 times per day dx code E11.69, patient is on insulin pump and is medically required to check 6 times per day. 200 each 3  . insulin aspart (NOVOLOG)  100 UNIT/ML injection Use max 120 units per day with insulin pump. 40 mL 5  . isosorbide mononitrate (IMDUR) 30 MG 24 hr tablet TAKE TWO TABLETS BY MOUTH ONCE DAILY 60 tablet 0  . lisinopril (PRINIVIL,ZESTRIL) 10 MG tablet Take 1 tablet (10 mg total) by mouth daily. 90 tablet 0  . neomycin-polymyxin-hydrocortisone (CORTISPORIN) otic solution Place 3 drops into the left ear 4 (four) times daily. 10 mL 0  . Omega-3 Fatty Acids (FISH OIL) 1000 MG CAPS Take by mouth 2 (two) times daily.    . pantoprazole (PROTONIX) 40 MG tablet Take 1 tablet (40 mg total) by mouth daily. 90 tablet 1  . sertraline (ZOLOFT) 50 MG tablet Take 1 tablet (50 mg) daily 90 tablet 0  . torsemide (DEMADEX) 20 MG tablet Take 2 tablets (40 mg total) by mouth daily. 180 tablet 1   No current facility-administered medications on file prior to visit.    Review of Systems  Constitutional: Negative for fever and chills.  HENT: Positive for hearing loss. Negative for ear discharge and ear pain.       Objective:    BP 144/84 mmHg  Pulse 65  Temp(Src) 98.3 F (36.8 C) (Oral)  Resp 18  Ht 6\' 4"  (1.93 m)  Wt 274 lb (124.286 kg)  BMI 33.37 kg/m2  SpO2 96% Nursing note and vital signs reviewed.  Physical Exam  Constitutional: He is  oriented to person, place, and time. He appears well-developed and well-nourished. No distress.  HENT:  Impacted cerumen noted bilaterally.   Cardiovascular: Normal rate, regular rhythm, normal heart sounds and intact distal pulses.   Pulmonary/Chest: Effort normal and breath sounds normal.  Neurological: He is alert and oriented to person, place, and time.  Skin: Skin is warm and dry.  Psychiatric: He has a normal mood and affect. His behavior is normal. Judgment and thought content normal.       Assessment & Plan:   Problem List Items Addressed This Visit      Endocrine   Diabetes mellitus with neurological manifestations, uncontrolled    Given neurological complications, diabetic  shoes are appropriate for foot protection. Prescription written and provided to the patient.         Nervous and Auditory   Impacted cerumen of both ears - Primary    Symptoms and exam consistent with cerumen impaction.  Both ears were cleaned out with warm water mixed with Docusate Sodium. There was wax return from both ears and ear drum was visible. Patient tolerated this well. Post care instructions provided.

## 2015-02-11 NOTE — Progress Notes (Signed)
Pre visit review using our clinic review tool, if applicable. No additional management support is needed unless otherwise documented below in the visit note. 

## 2015-02-12 DIAGNOSIS — I129 Hypertensive chronic kidney disease with stage 1 through stage 4 chronic kidney disease, or unspecified chronic kidney disease: Secondary | ICD-10-CM | POA: Diagnosis not present

## 2015-02-12 DIAGNOSIS — D509 Iron deficiency anemia, unspecified: Secondary | ICD-10-CM | POA: Diagnosis not present

## 2015-02-12 DIAGNOSIS — N183 Chronic kidney disease, stage 3 (moderate): Secondary | ICD-10-CM | POA: Diagnosis not present

## 2015-02-12 DIAGNOSIS — E1129 Type 2 diabetes mellitus with other diabetic kidney complication: Secondary | ICD-10-CM | POA: Diagnosis not present

## 2015-02-16 ENCOUNTER — Encounter: Payer: Self-pay | Admitting: Family

## 2015-02-17 ENCOUNTER — Encounter: Payer: Self-pay | Admitting: *Deleted

## 2015-02-17 ENCOUNTER — Ambulatory Visit (INDEPENDENT_AMBULATORY_CARE_PROVIDER_SITE_OTHER): Payer: Medicare Other | Admitting: Endocrinology

## 2015-02-17 VITALS — BP 100/40 | HR 70 | Resp 16 | Ht 76.0 in | Wt 272.8 lb

## 2015-02-17 DIAGNOSIS — E1165 Type 2 diabetes mellitus with hyperglycemia: Secondary | ICD-10-CM | POA: Diagnosis not present

## 2015-02-17 DIAGNOSIS — E1149 Type 2 diabetes mellitus with other diabetic neurological complication: Secondary | ICD-10-CM

## 2015-02-17 DIAGNOSIS — I952 Hypotension due to drugs: Secondary | ICD-10-CM | POA: Diagnosis not present

## 2015-02-17 DIAGNOSIS — IMO0002 Reserved for concepts with insufficient information to code with codable children: Secondary | ICD-10-CM

## 2015-02-17 NOTE — Progress Notes (Signed)
Patient ID: David [name], male   DOB: 11-03-1940, 74 y.o.   MRN: [id number]    Reason for Appointment: Followup for Type 2 Diabetes  Referring physician: Megan Salon  History of Present Illness:          Diagnosis: Type 2 diabetes mellitus, date of diagnosis:   1992       Past history:  He was initially treated with metformin and at some point also glipizide. His previous records from out of town are not available and no information is available about his level of control Apparently he was started on insulin in 1994 approximately because of poor control He has been on various insulin regimens over the last several years However even with insulin he has had poor control for at least the last 7 or 8 years. He does not know what his previous A1c levels have been. He had been continued on metformin and glipizide but metformin stopped about 2 years ago because of kidney function abnormality He had been taking Lantus 60 units twice a day with NovoLog previously and also Before his initial consultation in 6/15 he was on NovoLog twice a day and Humalog mix insulin  Because of poor control and large insulin doses he was started on Victoza in 6/15  Recent history:   INSULIN PUMP  regimen is as follows   Basal rates: midnight: 2.8, 5 AM = 3.55, 9:30 PM = 2.0 Carbohydrate coverage 1:7 for lunch otherwise 1:5, correction 1:10 with target 120-150  Average total daily insulin 86 +/-4 units, basal insulin is 91%  Since 04/28/14 he has been on a [employer] insulin pump because of persistent poor control and high insulin requirement Initially with starting the pump his blood sugars had been significantly better but subsequently blood sugars have been again high mostly related to noncompliance He was told to change his basal rates on the last visit but he has different basal rates than what was prescribed on last visit including lower basal rates in the afternoon hours He probably was being  held by his wife previously who apparently is indisposed recently and his daughter is accompanying him today His last A1c was 8.7%  Current problems and blood sugar patterns:  Checking blood sugars only about 2 times a day and not consistently everyday, previously was more compliant  He is only taking 9% of his insulin an boluses recently and these consist only of mealtime insulin  He is not taking any correction boluses when the blood sugars are high and he is not eating.  He frequently has only one bolus and a day even though he is usually eating about twice a day  Blood sugars are consistently high fasting and also less so at suppertime  He now says that he is taking his Victoza only 2 or 3 times a week as he forgets       Oral hypoglycemic drugs the patient is taking are: None, has renal dysfunction        Glucose monitoring:  done about 2-3 times a day         Glucometer:  contour     Blood Glucose readings from pump download:  Mean values apply above for all meters except median for One Touch  PRE-MEAL Fasting Lunch Dinner Bedtime Overall  Glucose range:  78-291   161-202   107-285      Mean/median:  201    185    183+/-48    Hypoglycemia: None  Glycemic control:   Lab Results  Component Value Date   HGBA1C 8.7* 12/15/2014   HGBA1C 11.5* 10/26/2014   HGBA1C 8.7* 09/12/2014   Lab Results  Component Value Date   MICROALBUR 18.8* 11/20/2013   LDLCALC 49 10/29/2013   CREATININE 1.86* 12/15/2014    Self-care: The diet that the patient has been following is: tries to limit fat intake   Meals: 2- 3 meals per day.breakfast is either cereal or eggs and toast, lunch is usually a sandwich , dinner at  6-7 pm.  He thinks his portions are small but periodically eats sweets         Exercise:  not doing exercise bike, previously  was doing 15-30 min        Dietician visit: Most recent: several years ago.               Compliance with the medical regimen: good Retinal exam:  Most recent: 05/2013.    Weight history:  Wt Readings from Last 3 Encounters:  02/17/15 272 lb 12.8 oz (123.741 kg)  02/11/15 274 lb (124.286 kg)  02/03/15 274 lb (124.286 kg)      Medication List       This list is accurate as of: 02/17/15  2:55 PM.  Always use your most recent med list.               acetaminophen 500 MG tablet  Commonly known as:  TYLENOL  Take 1,000 mg by mouth every 6 (six) hours as needed for fever.     aspirin EC 325 MG tablet  Take 1 tablet (325 mg total) by mouth daily.     B-D ULTRAFINE III SHORT PEN 31G X 8 MM Misc  Generic drug:  Insulin Pen Needle     calcitRIOL 0.25 MCG capsule  Commonly known as:  ROCALTROL  Take 0.25 mcg by mouth daily.     carvedilol 25 MG tablet  Commonly known as:  COREG  Take 1 tablet (25 mg total) by mouth 2 (two) times daily with a meal.     doxazosin 4 MG tablet  Commonly known as:  CARDURA  Take 1 tablet (4 mg total) by mouth daily.     Fish Oil 1000 MG Caps  Take by mouth 2 (two) times daily.     glucose blood test strip  Commonly known as:  BAYER CONTOUR NEXT TEST  Use as instructed to check blood sugar 6 times per day dx code E11.69, patient is on insulin pump and is medically required to check 6 times per day.     insulin aspart 100 UNIT/ML injection  Commonly known as:  NOVOLOG  Use max 120 units per day with insulin pump.     isosorbide mononitrate 30 MG 24 hr tablet  Commonly known as:  IMDUR  TAKE TWO TABLETS BY MOUTH ONCE DAILY     lisinopril 10 MG tablet  Commonly known as:  PRINIVIL,ZESTRIL  Take 1 tablet (10 mg total) by mouth daily.     LORazepam 1 MG tablet  Commonly known as:  ATIVAN     mupirocin ointment 2 %  Commonly known as:  BACTROBAN     neomycin-polymyxin-hydrocortisone otic solution  Commonly known as:  CORTISPORIN  Place 3 drops into the left ear 4 (four) times daily.     pantoprazole 40 MG tablet  Commonly known as:  PROTONIX  Take 1 tablet (40 mg total) by mouth  daily.     sertraline  50 MG tablet  Commonly known as:  ZOLOFT  Take 1 tablet (50 mg) daily     torsemide 20 MG tablet  Commonly known as:  DEMADEX  Take 2 tablets (40 mg total) by mouth daily.        Allergies: No Known Allergies  Past Medical History  Diagnosis Date  . Hypertension   . CAD (coronary artery disease)   . Renal insufficiency   . Palpitations   . Dyspnea   . Sinus bradycardia   . Obesity   . Allergy   . Kidney failure   . Arthritis   . Anxiety   . Cancer     skin  . Sleep apnea     uses CPAP nightly  . Diabetes     IDDM on pump  . Diabetes     Past Surgical History  Procedure Laterality Date  . Insert / replace / remove pacemaker    . Knee surgery    . Vein ligation and stripping    . 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 Orif ankle fracture Left 11/06/2014    Procedure: OPEN REDUCTION INTERNAL FIXATION (ORIF) LEFT  ANKLE FRACTURE;  Surgeon: [name], MD;  Location: [facility];  Service: Orthopedics;  Laterality: Left;    Family History  Problem Relation Age of Onset  . Cancer Mother     breast  . Heart attack Father     Social History:  reports that he has quit smoking. He has never used smokeless tobacco. He reports that he drinks about 1.2 oz of alcohol per week. He reports that he does not use illicit drugs.    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 Also had taken Gembrozil for several years without apparent side effects.  Both of these were  stopped because of increased liver functions and LFT has been back to normal  LDL has not been high subsequently but HDL is low Currently taking only fish oil Currently lipids are followed by PCP       Lab Results  Component Value Date   CHOL 159 09/12/2014   HDL 26.20* 09/12/2014   LDLCALC 49 10/29/2013    LDLDIRECT 77.0 09/12/2014   TRIG 206.0* 09/12/2014   CHOLHDL 6 09/12/2014      HYPERTENSION: The blood pressure has been treated with various drugs.   Currently taking 4 mg doxazosin, carvedilol, lisinopril and not taking any amlodipine  Blood pressure is relatively lower today although he does not complain of lightheadedness.  He says his medications were not changed by his nephrologist earlier this week  Chronic kidney disease: Etiology unknown, is followed by nephrologist and no recent information available Creatinine level has been variable Last creatinine:  Lab Results  Component Value Date   CREATININE 1.86* 12/15/2014        Has history of Numbness in his feet  since about 2013, no tingling or burning in feet    Previous foot exam findings:   Vibration sense is absent in toes. Ankle jerks are absent bilaterally.          Diabetic foot exam:  No callus formation. Mild denudation of skin on left second toe superiorly Amputation of right third toe. Thickening and dryness of skin on  the toes on the right Absent monofilament sensation in the feet Absent pedal pulses     Physical Examination:  BP 100/40 mmHg  Pulse 70  Resp 16  Ht 6\' 4"  (1.93 m)  Wt 272 lb 12.8 oz (123.741 kg)  BMI 33.22 kg/m2  SpO2 94%   ASSESSMENT:  Diabetes type 2, uncontrolled with neuropathy. His blood sugars are overall not consistently controlled with A1c recently still over 8% See history of present illness for detailed discussion of his current management, blood sugar patterns and problems identified  Although he is maintaining most of his weight loss that occurred previously he appears to be needing more insulin now As discussed above he is fairly noncompliant with bolusing when he is needing to and also not taking Victoza daily He does need to monitor more readings postprandially to help assess his bolus adequacy Not able to exercise currently His compliance with diet has been relatively  poor and he has a history of eating sweets.  PLAN:   Pump settings will be changed as follows: Midnight = 3.0, 5 AM = 3.8 and 9:30 p.m. = 3.0  Check blood sugars more often and also periodically after any one of the meals  More consistent boluses for all meals and snacks and take the bolus right at mealtimes   Take boluses for any reading over 150  Cover snacks with the boluses  Take Victoza consistently  Needs to have detailed review of day-to-day management and follow-up of compliance with nurse educator in 3 weeks  Reviewed instructions with patient and his daughter  Hypertension: Blood pressure is lower than usual including standing reading Recent labs from nephrologist not available Since he is on Coreg for cardiac reasons and lisinopril for nephropathy will only be able to reduce his doxazosin, not clear if this is being used partly for BPH  Continue follow-up with nephrologist for renal dysfunction  Counseling time on subjects discussed above is over 50% of today's 25 minute visit   Patient Instructions  Doxazosin 1/2 tab daily  Glucose monitoring: Check blood sugar at least 3 times a day including on waking up Check blood sugar before and 2 hours after 1 of the meals by turn DAILY  Make sure and take a bolus for every blood sugar that is over 150 even if not eating.  NEW basal rates: Midnight = 3.0. 5 AM = 3.8. 9:30 PM = 3.0  VICTOZA: Take this every morning consistently    Jaidy Cottam 02/17/2015, 2:55 PM   Note: This office note was prepared with Estate agent. Any transcriptional errors that result from this process are unintentional.

## 2015-02-17 NOTE — Patient Instructions (Signed)
Doxazosin 1/2 tab daily  Glucose monitoring: Check blood sugar at least 3 times a day including on waking up Check blood sugar before and 2 hours after 1 of the meals by turn DAILY  Make sure and take a bolus for every blood sugar that is over 150 even if not eating.  NEW basal rates: Midnight = 3.0. 5 AM = 3.8. 9:30 PM = 3.0  VICTOZA: Take this every morning consistently

## 2015-02-20 DIAGNOSIS — E1151 Type 2 diabetes mellitus with diabetic peripheral angiopathy without gangrene: Secondary | ICD-10-CM | POA: Diagnosis not present

## 2015-02-20 DIAGNOSIS — L97519 Non-pressure chronic ulcer of other part of right foot with unspecified severity: Secondary | ICD-10-CM | POA: Diagnosis not present

## 2015-02-20 DIAGNOSIS — L03031 Cellulitis of right toe: Secondary | ICD-10-CM | POA: Diagnosis not present

## 2015-02-22 ENCOUNTER — Telehealth: Payer: Self-pay | Admitting: Family

## 2015-02-22 NOTE — Telephone Encounter (Signed)
Requesting current home settings from patient.

## 2015-02-24 ENCOUNTER — Other Ambulatory Visit: Payer: Self-pay | Admitting: Family

## 2015-02-24 DIAGNOSIS — Z9989 Dependence on other enabling machines and devices: Secondary | ICD-10-CM

## 2015-02-24 DIAGNOSIS — L03031 Cellulitis of right toe: Secondary | ICD-10-CM | POA: Diagnosis not present

## 2015-02-24 DIAGNOSIS — G4733 Obstructive sleep apnea (adult) (pediatric): Secondary | ICD-10-CM

## 2015-02-24 DIAGNOSIS — E1151 Type 2 diabetes mellitus with diabetic peripheral angiopathy without gangrene: Secondary | ICD-10-CM | POA: Diagnosis not present

## 2015-02-24 DIAGNOSIS — L97519 Non-pressure chronic ulcer of other part of right foot with unspecified severity: Secondary | ICD-10-CM | POA: Diagnosis not present

## 2015-02-27 DIAGNOSIS — Z23 Encounter for immunization: Secondary | ICD-10-CM | POA: Diagnosis not present

## 2015-03-02 DIAGNOSIS — Z01818 Encounter for other preprocedural examination: Secondary | ICD-10-CM | POA: Diagnosis not present

## 2015-03-02 DIAGNOSIS — L03115 Cellulitis of right lower limb: Secondary | ICD-10-CM | POA: Diagnosis not present

## 2015-03-02 DIAGNOSIS — M86671 Other chronic osteomyelitis, right ankle and foot: Secondary | ICD-10-CM | POA: Diagnosis not present

## 2015-03-02 DIAGNOSIS — L97512 Non-pressure chronic ulcer of other part of right foot with fat layer exposed: Secondary | ICD-10-CM | POA: Diagnosis not present

## 2015-03-02 DIAGNOSIS — E1151 Type 2 diabetes mellitus with diabetic peripheral angiopathy without gangrene: Secondary | ICD-10-CM | POA: Diagnosis not present

## 2015-03-03 ENCOUNTER — Encounter: Payer: Self-pay | Admitting: Family

## 2015-03-03 ENCOUNTER — Other Ambulatory Visit (INDEPENDENT_AMBULATORY_CARE_PROVIDER_SITE_OTHER): Payer: Medicare Other

## 2015-03-03 ENCOUNTER — Ambulatory Visit (INDEPENDENT_AMBULATORY_CARE_PROVIDER_SITE_OTHER): Payer: Medicare Other | Admitting: Family

## 2015-03-03 ENCOUNTER — Encounter (HOSPITAL_BASED_OUTPATIENT_CLINIC_OR_DEPARTMENT_OTHER): Payer: Self-pay | Admitting: *Deleted

## 2015-03-03 VITALS — BP 104/54 | HR 67 | Temp 97.8°F | Resp 18 | Ht 76.0 in | Wt 275.0 lb

## 2015-03-03 DIAGNOSIS — Z01818 Encounter for other preprocedural examination: Secondary | ICD-10-CM

## 2015-03-03 LAB — URINALYSIS, ROUTINE W REFLEX MICROSCOPIC
Bilirubin Urine: NEGATIVE
Hgb urine dipstick: NEGATIVE
Ketones, ur: NEGATIVE
Leukocytes, UA: NEGATIVE
Nitrite: NEGATIVE
RBC / HPF: NONE SEEN (ref 0–?)
Specific Gravity, Urine: 1.02 (ref 1.000–1.030)
Urine Glucose: NEGATIVE
Urobilinogen, UA: 0.2 (ref 0.0–1.0)
WBC, UA: NONE SEEN (ref 0–?)
pH: 5.5 (ref 5.0–8.0)

## 2015-03-03 LAB — COMPREHENSIVE METABOLIC PANEL
ALT: 20 U/L (ref 0–53)
AST: 17 U/L (ref 0–37)
Albumin: 3.6 g/dL (ref 3.5–5.2)
Alkaline Phosphatase: 97 U/L (ref 39–117)
BUN: 48 mg/dL — ABNORMAL HIGH (ref 6–23)
CO2: 25 mEq/L (ref 19–32)
Calcium: 9.3 mg/dL (ref 8.4–10.5)
Chloride: 104 mEq/L (ref 96–112)
Creatinine, Ser: 2.04 mg/dL — ABNORMAL HIGH (ref 0.40–1.50)
GFR: 34.05 mL/min — ABNORMAL LOW (ref >60.00)
Glucose, Bld: 262 mg/dL — ABNORMAL HIGH (ref 70–99)
Potassium: 4.7 mEq/L (ref 3.5–5.1)
Sodium: 136 mEq/L (ref 135–145)
Total Bilirubin: 0.3 mg/dL (ref 0.2–1.2)
Total Protein: 7.2 g/dL (ref 6.0–8.3)

## 2015-03-03 NOTE — Assessment & Plan Note (Signed)
History and physical completed and reviewed for upcoming bone biopsy. Blood work reveal slightly elevated creatinine and blood sugars. This elevated creatinine is similar to baseline reading for him and his blood sugars are controlled with his insulin pump. There is no medical indication to prevent him from the biopsy.

## 2015-03-03 NOTE — Patient Instructions (Addendum)
Thank you for choosing [facility].  Summary/Instructions:  Once we receive your lab work we will fax it to your doctor's office.   Please stop by the lab on the basement level of the building for your blood work. Your results will be released to [facility] (or called to you) after review, usually within 72 hours after test completion. If any changes need to be made, you will be notified at that same time.

## 2015-03-03 NOTE — Progress Notes (Signed)
Subjective:    Patient ID: David [name], male    DOB: Sep 10, 1940, 74 y.o.   MRN: [id number]  Chief Complaint  Patient presents with  . Pre-op Exam    HPI:  David [name] is a 74 y.o. male who  has a past medical history of Hypertension; Arthritis; Anxiety; Sinus node dysfunction ([place]); Cardiac pacemaker in situ; CHF (congestive heart failure) ([place]); CAD (coronary artery disease); Dyspnea; Insulin dependent type 2 diabetes mellitus ([place]); Insulin pump in place; Anemia, iron deficiency; Secondary hyperparathyroidism of renal origin ([place]); CKD (chronic kidney disease), stage III (secondary to DM and HTN); History of skin cancer; Chronic ulcer of right foot ([place]); History of cellulitis; BPH (benign prostatic hypertrophy); Peripheral neuropathy ([place]); and Peripheral vascular disease ([place]). and presents today for a pre-operative visit.   Scheduled to undergo a bone biopsy on Friday, 10/21 and is requesting an H&P for preoperative clearance.    No Known Allergies   Outpatient Prescriptions Prior to Visit  Medication Sig Dispense Refill  . acetaminophen (TYLENOL) 500 MG tablet Take 1,000 mg by mouth every 6 (six) hours as needed for fever.    Marland Kitchen aspirin EC 325 MG tablet Take 1 tablet (325 mg total) by mouth daily. 42 tablet 0  . calcitRIOL (ROCALTROL) 0.25 MCG capsule Take 0.25 mcg by mouth daily.     . carvedilol (COREG) 25 MG tablet Take 1 tablet (25 mg total) by mouth 2 (two) times daily with a meal. 90 tablet 0  . doxazosin (CARDURA) 4 MG tablet Take 1 tablet (4 mg total) by mouth daily. 90 tablet 1  . insulin aspart (NOVOLOG) 100 UNIT/ML injection Use max 120 units per day with insulin pump. 40 mL 5  . isosorbide mononitrate (IMDUR) 30 MG 24 hr tablet TAKE TWO TABLETS BY MOUTH ONCE DAILY 60 tablet 0  . lisinopril (PRINIVIL,ZESTRIL) 10 MG tablet Take 1 tablet (10 mg total) by mouth daily. 90 tablet 0  . LORazepam (ATIVAN) 1 MG tablet     . mupirocin ointment (BACTROBAN) 2 %     .  neomycin-polymyxin-hydrocortisone (CORTISPORIN) otic solution Place 3 drops into the left ear 4 (four) times daily. 10 mL 0  . Omega-3 Fatty Acids (FISH OIL) 1000 MG CAPS Take by mouth 2 (two) times daily.    . pantoprazole (PROTONIX) 40 MG tablet Take 1 tablet (40 mg total) by mouth daily. 90 tablet 1  . sertraline (ZOLOFT) 50 MG tablet Take 1 tablet (50 mg) daily 90 tablet 0  . torsemide (DEMADEX) 20 MG tablet Take 2 tablets (40 mg total) by mouth daily. 180 tablet 1   No facility-administered medications prior to visit.     Past Medical History  Diagnosis Date  . Hypertension   . Arthritis   . Anxiety   . Sinus node dysfunction (H[facility])   . Cardiac pacemaker in situ   . CHF (congestive heart failure) ([place])   . CAD (coronary artery disease)     Nonobstructive CAD per cath  . Dyspnea   . Insulin dependent type 2 diabetes mellitus ([place])     followd by [name]--  has insulin pump  . Insulin pump in place   . Anemia, iron deficiency   . Secondary hyperparathyroidism of renal origin ([place])   . CKD (chronic kidney disease), 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    nephrologist-    . History of skin cancer   . Chronic ulcer of right foot ([place])   . History  of cellulitis     right great toe 10-25-2014  . BPH (benign prostatic hypertrophy)   . Peripheral neuropathy (H[facility])     severe  . Peripheral vascular disease ([place])     bilateral lower extremities     Past Surgical History  Procedure Laterality Date  . Vein ligation and stripping    . Amputation of replicated toes  Mar 6568    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)  . Orif ankle fracture Left 11/06/2014    Procedure: OPEN REDUCTION INTERNAL FIXATION (ORIF) LEFT  ANKLE FRACTURE;  Surgeon: [name], MD;  Location: [facility];  Service: Orthopedics;  Laterality: Left;  . Cardiac catheterization  11-25-2010   [place], [place]    Nonobstructive CAD  . Cardiac pacemaker placement  Nov 2009    [employer]  . Total knee  arthroplasty       Family History  Problem Relation Age of Onset  . Cancer Mother     breast  . Heart attack Father      Social History   Social History  . Marital Status: Married    Spouse Name: N/A  . Number of Children: 3  . Years of Education: 12   Occupational History  . Retired    Social History Main Topics  . Smoking status: Former Smoker -- 2.00 packs/day for 30 years  . Smokeless tobacco: Never Used  . Alcohol Use: 1.2 oz/week    2 Glasses of wine per week     Comment: social  . Drug Use: No  . Sexual Activity: Not on file   Other Topics Concern  . Not on file   Social History Narrative   Currently resides with his wife. 1 dog. Fun: Golf   Denies any religious beliefs effecting health care.     Review of Systems    Constitutional: Denies fever, chills, fatigue, or significant weight gain/loss. HENT: Head: Denies headache or neck pain Ears: Denies changes in hearing, ringing in ears, earache, drainage Nose: Denies discharge, stuffiness, itching, nosebleed, sinus pain Throat: Denies sore throat, hoarseness, dry mouth, sores, thrush Eyes: Denies loss/changes in vision, pain, redness, blurry/double vision, flashing lights Cardiovascular: Denies chest pain/discomfort, tightness, palpitations, shortness of breath with activity, difficulty lying down, swelling, sudden awakening with shortness of breath Respiratory: Denies shortness of breath, cough, sputum production, wheezing Gastrointestinal: Denies dysphasia, heartburn, change in appetite, nausea, change in bowel habits, rectal bleeding, constipation, diarrhea, yellow skin or eyes Genitourinary: Denies frequency, urgency, burning/pain, blood in urine, incontinence, change in urinary strength. Musculoskeletal: Denies muscle/joint pain, stiffness, back pain, redness or swelling of joints, trauma Skin: Denies rashes, lumps, itching, dryness, color changes, or hair/nail changes Neurological: Denies dizziness,  fainting, seizures, weakness, numbness, tingling, tremor Psychiatric - Denies nervousness, stress, depression or memory loss Endocrine: Denies heat or cold intolerance, sweating, frequent urination, excessive thirst, changes in appetite Hematologic: Denies ease of bruising or bleeding  Objective:    BP 104/54 mmHg  Pulse 67  Temp(Src) 97.8 F (36.6 C) (Oral)  Resp 18  Ht _0  (1.93 m)  Wt 275 lb (124.739 kg)  BMI 33.49 kg/m2  SpO2 95% Nursing note and vital signs reviewed.  Physical Exam  Constitutional: He is oriented to person, place, and time. He appears well-developed and well-nourished.  HENT:  Head: Normocephalic.  Right Ear: Hearing, tympanic membrane, external ear and ear canal normal.  Left Ear: Hearing, tympanic membrane, external ear and ear canal normal.  Nose: Nose normal.  Mouth/Throat: Uvula is midline,  oropharynx is clear and moist and mucous membranes are normal.  Eyes: Conjunctivae and EOM are normal. Pupils are equal, round, and reactive to light.  Neck: Neck supple. No JVD present. No tracheal deviation present. No thyromegaly present.  Cardiovascular: Normal rate, regular rhythm, normal heart sounds and intact distal pulses.   Pulmonary/Chest: Effort normal and breath sounds normal.  Abdominal: Soft. Bowel sounds are normal. He exhibits no distension and no mass. There is no tenderness. There is no rebound and no guarding.  Musculoskeletal: Normal range of motion. He exhibits no edema or tenderness.  Lymphadenopathy:    He has no cervical adenopathy.  Neurological: He is alert and oriented to person, place, and time. He has normal reflexes. No cranial nerve deficit. He exhibits normal muscle tone. Coordination normal.  Skin: Skin is warm and dry.  Psychiatric: He has a normal mood and affect. His behavior is normal. Judgment and thought content normal.      Assessment & Plan:   Problem List Items Addressed This Visit      Other   Preoperative  clearance - Primary    History and physical completed and reviewed for upcoming bone biopsy. Blood work reveal slightly elevated creatinine and blood sugars. This elevated creatinine is similar to baseline reading for him and his blood sugars are controlled with his insulin pump. There is no medical indication to prevent him from the biopsy.       Relevant Orders   Comp Met (CMET) (Completed)   Urinalysis, Routine w reflex microscopic (Completed)

## 2015-03-03 NOTE — Progress Notes (Signed)
Pre visit review using our clinic review tool, if applicable. No additional management support is needed unless otherwise documented below in the visit note. 

## 2015-03-04 ENCOUNTER — Encounter: Payer: Self-pay | Admitting: Foot & Ankle Surgery

## 2015-03-04 ENCOUNTER — Encounter (HOSPITAL_BASED_OUTPATIENT_CLINIC_OR_DEPARTMENT_OTHER): Payer: Self-pay | Admitting: *Deleted

## 2015-03-04 ENCOUNTER — Other Ambulatory Visit: Payer: Self-pay | Admitting: Foot & Ankle Surgery

## 2015-03-04 NOTE — H&P (Signed)
  Will be completed by PCP

## 2015-03-04 NOTE — Progress Notes (Signed)
Spoke with wife -to [facility] at 1130-CBG on arrival-current Cmet,Ekg,recent office visit notes.pacemaker info in chart.Spoke with Dr Tobias Alexander about insulin pump-instructed patient to set as appropriate for fasting procedure-will take coreg,isosorbide,protonix with small amt water-instructed Npo after Mn of solids-clear liquids(no dairy,pulp)until 0630-then Npo.

## 2015-03-06 ENCOUNTER — Encounter (HOSPITAL_BASED_OUTPATIENT_CLINIC_OR_DEPARTMENT_OTHER): Payer: Self-pay | Admitting: *Deleted

## 2015-03-06 ENCOUNTER — Ambulatory Visit (HOSPITAL_BASED_OUTPATIENT_CLINIC_OR_DEPARTMENT_OTHER): Payer: Medicare Other | Admitting: Certified Registered"

## 2015-03-06 ENCOUNTER — Encounter (HOSPITAL_BASED_OUTPATIENT_CLINIC_OR_DEPARTMENT_OTHER)
Admission: RE | Disposition: A | Discharge status: Home / Self Care | Payer: Self-pay | Source: Ambulatory Visit | Attending: Foot & Ankle Surgery

## 2015-03-06 ENCOUNTER — Ambulatory Visit (HOSPITAL_BASED_OUTPATIENT_CLINIC_OR_DEPARTMENT_OTHER)
Admission: RE | Admit: 2015-03-06 | Discharge: 2015-03-06 | Disposition: A | Discharge status: Home / Self Care | Payer: Medicare Other | Source: Ambulatory Visit | Attending: Foot & Ankle Surgery | Admitting: Foot & Ankle Surgery

## 2015-03-06 DIAGNOSIS — Z7952 Long term (current) use of systemic steroids: Secondary | ICD-10-CM | POA: Diagnosis not present

## 2015-03-06 DIAGNOSIS — L97419 Non-pressure chronic ulcer of right heel and midfoot with unspecified severity: Secondary | ICD-10-CM | POA: Diagnosis not present

## 2015-03-06 DIAGNOSIS — E1151 Type 2 diabetes mellitus with diabetic peripheral angiopathy without gangrene: Secondary | ICD-10-CM | POA: Insufficient documentation

## 2015-03-06 DIAGNOSIS — N183 Chronic kidney disease, stage 3 (moderate): Secondary | ICD-10-CM | POA: Insufficient documentation

## 2015-03-06 DIAGNOSIS — N189 Chronic kidney disease, unspecified: Secondary | ICD-10-CM | POA: Diagnosis not present

## 2015-03-06 DIAGNOSIS — I509 Heart failure, unspecified: Secondary | ICD-10-CM | POA: Insufficient documentation

## 2015-03-06 DIAGNOSIS — M1990 Unspecified osteoarthritis, unspecified site: Secondary | ICD-10-CM | POA: Insufficient documentation

## 2015-03-06 DIAGNOSIS — N2581 Secondary hyperparathyroidism of renal origin: Secondary | ICD-10-CM | POA: Diagnosis not present

## 2015-03-06 DIAGNOSIS — I2510 Atherosclerotic heart disease of native coronary artery without angina pectoris: Secondary | ICD-10-CM | POA: Diagnosis not present

## 2015-03-06 DIAGNOSIS — M86171 Other acute osteomyelitis, right ankle and foot: Secondary | ICD-10-CM | POA: Diagnosis not present

## 2015-03-06 DIAGNOSIS — Z7982 Long term (current) use of aspirin: Secondary | ICD-10-CM | POA: Diagnosis not present

## 2015-03-06 DIAGNOSIS — L97911 Non-pressure chronic ulcer of unspecified part of right lower leg limited to breakdown of skin: Secondary | ICD-10-CM | POA: Diagnosis not present

## 2015-03-06 DIAGNOSIS — Z87891 Personal history of nicotine dependence: Secondary | ICD-10-CM | POA: Insufficient documentation

## 2015-03-06 DIAGNOSIS — Z794 Long term (current) use of insulin: Secondary | ICD-10-CM | POA: Insufficient documentation

## 2015-03-06 DIAGNOSIS — L97512 Non-pressure chronic ulcer of other part of right foot with fat layer exposed: Secondary | ICD-10-CM | POA: Diagnosis not present

## 2015-03-06 DIAGNOSIS — E1122 Type 2 diabetes mellitus with diabetic chronic kidney disease: Secondary | ICD-10-CM | POA: Insufficient documentation

## 2015-03-06 DIAGNOSIS — G4730 Sleep apnea, unspecified: Secondary | ICD-10-CM | POA: Insufficient documentation

## 2015-03-06 DIAGNOSIS — E11621 Type 2 diabetes mellitus with foot ulcer: Secondary | ICD-10-CM | POA: Diagnosis not present

## 2015-03-06 DIAGNOSIS — N400 Enlarged prostate without lower urinary tract symptoms: Secondary | ICD-10-CM | POA: Insufficient documentation

## 2015-03-06 DIAGNOSIS — L0390 Cellulitis, unspecified: Secondary | ICD-10-CM | POA: Insufficient documentation

## 2015-03-06 DIAGNOSIS — I129 Hypertensive chronic kidney disease with stage 1 through stage 4 chronic kidney disease, or unspecified chronic kidney disease: Secondary | ICD-10-CM | POA: Diagnosis not present

## 2015-03-06 DIAGNOSIS — Z79899 Other long term (current) drug therapy: Secondary | ICD-10-CM | POA: Insufficient documentation

## 2015-03-06 DIAGNOSIS — E1140 Type 2 diabetes mellitus with diabetic neuropathy, unspecified: Secondary | ICD-10-CM | POA: Diagnosis not present

## 2015-03-06 DIAGNOSIS — Z96659 Presence of unspecified artificial knee joint: Secondary | ICD-10-CM | POA: Diagnosis not present

## 2015-03-06 DIAGNOSIS — Z950 Presence of cardiac pacemaker: Secondary | ICD-10-CM | POA: Diagnosis not present

## 2015-03-06 DIAGNOSIS — I130 Hypertensive heart and chronic kidney disease with heart failure and stage 1 through stage 4 chronic kidney disease, or unspecified chronic kidney disease: Secondary | ICD-10-CM | POA: Insufficient documentation

## 2015-03-06 DIAGNOSIS — Z89421 Acquired absence of other right toe(s): Secondary | ICD-10-CM | POA: Insufficient documentation

## 2015-03-06 HISTORY — DX: Type 2 diabetes mellitus without complications: E11.9

## 2015-03-06 HISTORY — DX: Benign prostatic hyperplasia without lower urinary tract symptoms: N40.0

## 2015-03-06 HISTORY — DX: Heart failure, unspecified: I50.9

## 2015-03-06 HISTORY — DX: Sick sinus syndrome: I49.5

## 2015-03-06 HISTORY — DX: Secondary hyperparathyroidism of renal origin: N25.81

## 2015-03-06 HISTORY — DX: Presence of cardiac pacemaker: Z95.0

## 2015-03-06 HISTORY — DX: Polyneuropathy, unspecified: G62.9

## 2015-03-06 HISTORY — DX: Peripheral vascular disease, unspecified: I73.9

## 2015-03-06 HISTORY — DX: Personal history of diseases of the skin and subcutaneous tissue: Z87.2

## 2015-03-06 HISTORY — DX: Non-pressure chronic ulcer of other part of right foot with unspecified severity: L97.519

## 2015-03-06 HISTORY — PX: EXCISION BONE CYST: SHX6616

## 2015-03-06 HISTORY — DX: Chronic kidney disease, stage 3 unspecified: N18.30

## 2015-03-06 HISTORY — DX: Personal history of other malignant neoplasm of skin: Z85.828

## 2015-03-06 HISTORY — DX: Iron deficiency anemia, unspecified: D50.9

## 2015-03-06 HISTORY — DX: Chronic kidney disease, stage 3 (moderate): N18.3

## 2015-03-06 HISTORY — DX: Presence of insulin pump (external) (internal): Z96.41

## 2015-03-06 HISTORY — DX: Long term (current) use of insulin: Z79.4

## 2015-03-06 LAB — POCT I-STAT 4, (NA,K, GLUC, HGB,HCT)
Glucose, Bld: 183 mg/dL — ABNORMAL HIGH (ref 65–99)
HCT: 30 % — ABNORMAL LOW (ref 39.0–52.0)
Hemoglobin: 10.2 g/dL — ABNORMAL LOW (ref 13.0–17.0)
Potassium: 5.8 mmol/L — ABNORMAL HIGH (ref 3.5–5.1)
Sodium: 137 mmol/L (ref 135–145)

## 2015-03-06 LAB — GLUCOSE, CAPILLARY: Glucose-Capillary: 172 mg/dL — ABNORMAL HIGH (ref 65–99)

## 2015-03-06 SURGERY — EXCISION, CYST, BONE
Anesthesia: Monitor Anesthesia Care | Laterality: Right

## 2015-03-06 MED ORDER — CEFAZOLIN SODIUM-DEXTROSE 2-3 GM-% IV SOLR
INTRAVENOUS | Status: DC | PRN
  Administered 2015-03-06: 2 g via INTRAVENOUS

## 2015-03-06 MED ORDER — LIDOCAINE HCL 1 % IJ SOLN
INTRAMUSCULAR | Status: DC | PRN
  Administered 2015-03-06: 10 mL

## 2015-03-06 MED ORDER — ACETAMINOPHEN 325 MG PO TABS
650.0000 mg | ORAL_TABLET | ORAL | Status: DC | PRN
  Filled 2015-03-06: qty 2

## 2015-03-06 MED ORDER — LIDOCAINE HCL (CARDIAC) 20 MG/ML IV SOLN
INTRAVENOUS | Status: DC | PRN
  Administered 2015-03-06: 40 mg via INTRAVENOUS

## 2015-03-06 MED ORDER — SODIUM CHLORIDE 0.9 % IJ SOLN
3.0000 mL | INTRAMUSCULAR | Status: DC | PRN
  Filled 2015-03-06: qty 3

## 2015-03-06 MED ORDER — PROPOFOL 500 MG/50ML IV EMUL
INTRAVENOUS | Status: DC | PRN
  Administered 2015-03-06: 25 ug/kg/min via INTRAVENOUS

## 2015-03-06 MED ORDER — MIDAZOLAM HCL 5 MG/5ML IJ SOLN
INTRAMUSCULAR | Status: DC | PRN
  Administered 2015-03-06: 0.5 mg via INTRAVENOUS
  Administered 2015-03-06: 1 mg via INTRAVENOUS
  Administered 2015-03-06: 0.5 mg via INTRAVENOUS

## 2015-03-06 MED ORDER — ONDANSETRON HCL 4 MG/2ML IJ SOLN
INTRAMUSCULAR | Status: DC | PRN
  Administered 2015-03-06: 4 mg via INTRAVENOUS

## 2015-03-06 MED ORDER — MORPHINE SULFATE (PF) 2 MG/ML IV SOLN
2.0000 mg | INTRAVENOUS | Status: DC | PRN
  Filled 2015-03-06: qty 1

## 2015-03-06 MED ORDER — SODIUM CHLORIDE 0.9 % IJ SOLN
3.0000 mL | Freq: Two times a day (BID) | INTRAMUSCULAR | Status: DC
  Filled 2015-03-06: qty 3

## 2015-03-06 MED ORDER — HYDROCODONE-ACETAMINOPHEN 5-325 MG PO TABS
1.0000 | ORAL_TABLET | ORAL | Status: DC | PRN
  Filled 2015-03-06: qty 1

## 2015-03-06 MED ORDER — PROMETHAZINE HCL 25 MG/ML IJ SOLN
6.2500 mg | INTRAMUSCULAR | Status: DC | PRN
  Filled 2015-03-06: qty 1

## 2015-03-06 MED ORDER — MEPERIDINE HCL 25 MG/ML IJ SOLN
6.2500 mg | INTRAMUSCULAR | Status: DC | PRN
  Filled 2015-03-06: qty 1

## 2015-03-06 MED ORDER — MIDAZOLAM HCL 2 MG/2ML IJ SOLN
INTRAMUSCULAR | Status: AC
  Filled 2015-03-06: qty 2

## 2015-03-06 MED ORDER — SODIUM CHLORIDE 0.9 % IV SOLN
250.0000 mL | INTRAVENOUS | Status: DC | PRN
  Filled 2015-03-06: qty 250

## 2015-03-06 MED ORDER — PROMETHAZINE HCL 12.5 MG PO TABS
12.5000 mg | ORAL_TABLET | ORAL | Status: DC | PRN
  Filled 2015-03-06: qty 1

## 2015-03-06 MED ORDER — FENTANYL CITRATE (PF) 100 MCG/2ML IJ SOLN
INTRAMUSCULAR | Status: DC | PRN
  Administered 2015-03-06 (×8): 12.5 ug via INTRAVENOUS

## 2015-03-06 MED ORDER — LACTATED RINGERS IV SOLN
INTRAVENOUS | Status: DC
  Administered 2015-03-06: 13:00:00 via INTRAVENOUS
  Filled 2015-03-06: qty 1000

## 2015-03-06 MED ORDER — FENTANYL CITRATE (PF) 100 MCG/2ML IJ SOLN
25.0000 ug | INTRAMUSCULAR | Status: DC | PRN
  Filled 2015-03-06: qty 1

## 2015-03-06 MED ORDER — CEFAZOLIN SODIUM-DEXTROSE 2-3 GM-% IV SOLR
INTRAVENOUS | Status: AC
  Filled 2015-03-06: qty 50

## 2015-03-06 MED ORDER — PROPOFOL 10 MG/ML IV BOLUS
INTRAVENOUS | Status: DC | PRN
  Administered 2015-03-06: 20 mg via INTRAVENOUS

## 2015-03-06 MED ORDER — OXYCODONE-ACETAMINOPHEN 5-325 MG PO TABS
1.0000 | ORAL_TABLET | ORAL | Status: DC | PRN
  Filled 2015-03-06: qty 2

## 2015-03-06 MED ORDER — FENTANYL CITRATE (PF) 100 MCG/2ML IJ SOLN
INTRAMUSCULAR | Status: AC
Start: 2015-03-06 — End: 2015-03-06
  Filled 2015-03-06: qty 4

## 2015-03-06 MED ORDER — TRAZODONE 25 MG HALF TABLET
25.0000 mg | ORAL_TABLET | Freq: Every evening | ORAL | Status: DC | PRN
  Filled 2015-03-06: qty 1

## 2015-03-06 MED ORDER — MORPHINE SULFATE (PF) 4 MG/ML IV SOLN
4.0000 mg | INTRAVENOUS | Status: DC | PRN
  Filled 2015-03-06: qty 1

## 2015-03-06 SURGICAL SUPPLY — 33 items
BANDAGE COBAN STERILE 2 (GAUZE/BANDAGES/DRESSINGS) ×2 IMPLANT
BLADE SURG 15 STRL LF DISP TIS (BLADE) ×1 IMPLANT
BLADE SURG 15 STRL SS (BLADE) ×1
BNDG CONFORM 2 STRL LF (GAUZE/BANDAGES/DRESSINGS) ×2 IMPLANT
BNDG GAUZE ELAST 4 BULKY (GAUZE/BANDAGES/DRESSINGS) ×2 IMPLANT
CONT SPEC 4OZ CLIKSEAL STRL BL (MISCELLANEOUS) ×8 IMPLANT
COVER BACK TABLE 60X90IN (DRAPES) ×2 IMPLANT
COVER MAYO STAND STRL (DRAPES) ×2 IMPLANT
DRAPE EXTREMITY T 121X128X90 (DRAPE) ×2 IMPLANT
DURAPREP 26ML APPLICATOR (WOUND CARE) ×2 IMPLANT
ELECT REM PT RETURN 9FT ADLT (ELECTROSURGICAL)
ELECTRODE REM PT RTRN 9FT ADLT (ELECTROSURGICAL) IMPLANT
GAUZE XEROFORM 1X8 LF (GAUZE/BANDAGES/DRESSINGS) ×2 IMPLANT
GLOVE BIO SURGEON STRL SZ7.5 (GLOVE) ×2 IMPLANT
GLOVE BIOGEL PI IND STRL 7.5 (GLOVE) ×1 IMPLANT
GLOVE BIOGEL PI INDICATOR 7.5 (GLOVE) ×1
GOWN STRL REUS W/ TWL XL LVL3 (GOWN DISPOSABLE) ×1 IMPLANT
GOWN STRL REUS W/TWL XL LVL3 (GOWN DISPOSABLE) ×1
KIT ROOM TURNOVER WOR (KITS) ×2 IMPLANT
MANIFOLD NEPTUNE II (INSTRUMENTS) IMPLANT
NEEDLE 27GAX1X1/2 (NEEDLE) IMPLANT
NEEDLE HYPO 22GX1.5 SAFETY (NEEDLE) IMPLANT
PACK BASIN DAY SURGERY FS (CUSTOM PROCEDURE TRAY) ×2 IMPLANT
PENCIL BUTTON HOLSTER BLD 10FT (ELECTRODE) IMPLANT
SPONGE GAUZE 4X4 12PLY (GAUZE/BANDAGES/DRESSINGS) ×2 IMPLANT
STOCKINETTE 4X48 STRL (DRAPES) ×2 IMPLANT
SUT ETHILON 4 0 PS 2 18 (SUTURE) IMPLANT
SWAB CULTURE LIQ STUART DBL (MISCELLANEOUS) IMPLANT
SYR CONTROL 10ML LL (SYRINGE) ×4 IMPLANT
TRAY BIOPSY JAMSHIDI 11X4 J-ST (NEEDLE) ×2 IMPLANT
TUBE ANAEROBIC SPECIMEN COL (MISCELLANEOUS) IMPLANT
TUBE CONNECTING 12X1/4 (SUCTIONS) IMPLANT
UNDERPAD 30X30 INCONTINENT (UNDERPADS AND DIAPERS) ×2 IMPLANT

## 2015-03-06 NOTE — Anesthesia Procedure Notes (Signed)
Procedure Name: MAC Date/Time: 03/06/2015 1:01 PM Performed by: Justice Rocher Pre-anesthesia Checklist: Patient identified, Timeout performed, Emergency Drugs available, Suction available and Patient being monitored Patient Re-evaluated:Patient Re-evaluated prior to inductionOxygen Delivery Method: Simple face mask Preoxygenation: Pre-oxygenation with 100% oxygen Intubation Type: IV induction Placement Confirmation: positive ETCO2 and breath sounds checked- equal and bilateral

## 2015-03-06 NOTE — Anesthesia Preprocedure Evaluation (Addendum)
Anesthesia Evaluation  Patient identified by MRN, date of birth, ID band Patient awake    Reviewed: Allergy & Precautions, NPO status , Patient's Chart, lab work & pertinent test results  Airway Mallampati: II  TM Distance: >3 FB Neck ROM: Full    Dental  (+) Upper Dentures, Lower Dentures, Dental Advisory Given   Pulmonary sleep apnea and Continuous Positive Airway Pressure Ventilation , former smoker,    breath sounds clear to auscultation       Cardiovascular hypertension, Pt. on medications and Pt. on home beta blockers + CAD  + pacemaker + Valvular Problems/Murmurs AI  Rhythm:Regular Rate:Normal     Neuro/Psych  Neuromuscular disease negative psych ROS   GI/Hepatic negative GI ROS, Neg liver ROS,   Endo/Other  diabetes, Well Controlled, Type 2, Insulin DependentMorbid obesity  Renal/GU Renal InsufficiencyRenal disease  negative genitourinary   Musculoskeletal negative musculoskeletal ROS (+)   Abdominal   Peds negative pediatric ROS (+)  Hematology negative hematology ROS (+)   Anesthesia Other Findings   Reproductive/Obstetrics negative OB ROS                            Anesthesia Physical  Anesthesia Plan  ASA: III  Anesthesia Plan: General and MAC   Post-op Pain Management:    Induction: Intravenous  Airway Management Planned: LMA and Simple Face Mask  Additional Equipment:   Intra-op Plan:   Post-operative Plan: Extubation in OR  Informed Consent: I have reviewed the patients History and Physical, chart, labs and discussed the procedure including the risks, benefits and alternatives for the proposed anesthesia with the patient or authorized representative who has indicated his/her understanding and acceptance.   Dental advisory given  Plan Discussed with: CRNA, Anesthesiologist and Surgeon  Anesthesia Plan Comments: (MAc or GA)        Anesthesia Quick  Evaluation

## 2015-03-06 NOTE — Anesthesia Postprocedure Evaluation (Signed)
  Anesthesia Post-op Note  Patient: David [name]  Procedure(s) Performed: Procedure(s) (LRB): BONE BIOPSIES OF RIGHT FOOT (Right)  Patient Location: PACU  Anesthesia Type: General  Level of Consciousness: awake and alert   Airway and Oxygen Therapy: Patient Spontanous Breathing  Post-op Pain: mild  Post-op Assessment: Post-op Vital signs reviewed, Patient's Cardiovascular Status Stable, Respiratory Function Stable, Patent Airway and No signs of Nausea or vomiting  Last Vitals:  Filed Vitals:   03/06/15 1444  BP: 150/71  Pulse: 62  Temp: 36.5 C  Resp: 16    Post-op Vital Signs: stable   Complications: No apparent anesthesia complications

## 2015-03-06 NOTE — Op Note (Signed)
PREOPERATIVE DIAGNOSIS: 1.  Ulceration right plantar hallux; Diabetes with Neuropathy; Cellulitis  POSTOPERATIVE DIAGNOSIS: SAME OPERATION: 1. Bone Biopsy x2 ANESTHESIA:  Local with IV sedation SURGEON: N'Tuma Breyana Follansbee ASSISTANTS: NA HEMOSTASIS: None  ESTIMATED BLOOD LOSS: MINIMAL MATERIALS: None PATHOLOGY: Bone cotex to pathology and Microbiology   OPERATIVE PROCEDURE:  The patient was brought to the operating room and placed on the operating table in the supine position.    Following IV sedation the foot was then scrubbed, prepped, and draped in the usual aseptic manner.  Local anesthesia was obtained utilizing 05cc of 1% lidocaine plain.   Attention was then directed to the dorsal aspect of the hallux of the right foot.  A 3cm x 2cm ulceration was noted to the plantar aspect of the hallux with surrounding erythema.  The wound base was 100% fibrotic.  There was positive serous drainage noted.   Using the Jamshidi tray bone biopsy tray, 2 punches were obtained from the dorsal medial and dorsal lateral aspect of the hallux.  The bone was noted to be hard.  Each specimen was then divided and one piece was sent to Pathology and then other piece was sent to microbiology for a total of 4 specimen.  The specimens were obtained in the proper manner as described in Jamshidi protocol.   The hallux was then dressed with topical antibiotic ointment, xeroform, conform and light coban wrapping.

## 2015-03-06 NOTE — Discharge Instructions (Signed)
Follow up at [facility] Monday 03/09/15 1130am Keep dressing clean, dry and intact. Restart medications. Patient has PO pain medication and antibiotics at home.  Call Dr. Melony Overly [phone] with any issues, problems and/or concerns.

## 2015-03-06 NOTE — Brief Op Note (Signed)
03/06/2015  4:29 PM  PATIENT:  David [name]  74 y.o. male  PRE-OPERATIVE DIAGNOSIS:  ULCER OF RIGHT FOOT,CHRONIC OSTEOMYELITIS OF RIGHT FOOT  POST-OPERATIVE DIAGNOSIS:  ULCER OF RIGHT FOOT,CHRONIC  PROCEDURE:  Procedure(s): BONE BIOPSIES OF RIGHT FOOT (Right)  SURGEON:  Surgeon(s) and Role:    * [name], MD - Primary  PHYSICIAN ASSISTANT:   ASSISTANTS: none   ANESTHESIA:   MAC  EBL:  Total I/O In: 500 [I.V.:500] Out: -   BLOOD ADMINISTERED:none  DRAINS: none   LOCAL MEDICATIONS USED:  LIDOCAINE   SPECIMEN:  Biopsy / Limited Resection  DISPOSITION OF SPECIMEN:  PATHOLOGY  COUNTS:  YES  TOURNIQUET:  * No tourniquets in log *  DICTATION: .Note written in paper chart  PLAN OF CARE: Discharge to home after PACU  PATIENT DISPOSITION:  PACU - hemodynamically stable.   Delay start of Pharmacological VTE agent (>24hrs) due to surgical blood loss or risk of bleeding: no

## 2015-03-06 NOTE — Transfer of Care (Addendum)
Immediate Anesthesia Transfer of Care Note  Patient: David [name]  Procedure(s) Performed: Procedure(s) (LRB): BONE BIOPSIES OF RIGHT FOOT (Right)  Patient Location: PACU  Anesthesia Type: MAC  Level of Consciousness: awake, sedated, patient cooperative and responds to stimulation  Airway & Oxygen Therapy: Patient Spontanous Breathing and Patient connected to face mask oxygen  Post-op Assessment: Report given to PACU RN, Post -op Vital signs reviewed and stable and Patient moving all extremities  Post vital signs: Reviewed and stable  Complications: No apparent anesthesia complications

## 2015-03-09 ENCOUNTER — Encounter: Payer: Self-pay | Admitting: Family

## 2015-03-09 MED ORDER — CARVEDILOL 25 MG PO TABS: 25.0000 mg | ORAL_TABLET | Freq: Two times a day (BID) | ORAL | Status: DC

## 2015-03-10 ENCOUNTER — Encounter (HOSPITAL_BASED_OUTPATIENT_CLINIC_OR_DEPARTMENT_OTHER): Payer: Medicare Other

## 2015-03-10 ENCOUNTER — Encounter (HOSPITAL_BASED_OUTPATIENT_CLINIC_OR_DEPARTMENT_OTHER): Payer: Self-pay | Admitting: Foot & Ankle Surgery

## 2015-03-10 LAB — TISSUE CULTURE
Gram Stain: NONE SEEN
Gram Stain: NONE SEEN

## 2015-03-11 ENCOUNTER — Encounter: Payer: Self-pay | Admitting: Foot & Ankle Surgery

## 2015-03-11 ENCOUNTER — Encounter (HOSPITAL_BASED_OUTPATIENT_CLINIC_OR_DEPARTMENT_OTHER): Payer: Self-pay | Admitting: *Deleted

## 2015-03-11 ENCOUNTER — Other Ambulatory Visit: Payer: Self-pay | Admitting: Foot & Ankle Surgery

## 2015-03-11 NOTE — Progress Notes (Signed)
SPOKE W/ WIFE.  NPO AFTER MN.  ARRIVE AT 0600.  CURRENT LAB RESULTS AND EKG IN CHART AND EPIC. WILL TAKE COREG, IMDUR, AND PROTONIX AM DOS W/ SIPS OF WATER.

## 2015-03-12 ENCOUNTER — Ambulatory Visit (HOSPITAL_COMMUNITY)
Admission: RE | Admit: 2015-03-12 | Discharge: 2015-03-12 | Disposition: A | Discharge status: Home / Self Care | Payer: Medicare Other | Source: Ambulatory Visit | Attending: Foot & Ankle Surgery | Admitting: Foot & Ankle Surgery

## 2015-03-12 ENCOUNTER — Ambulatory Visit (HOSPITAL_BASED_OUTPATIENT_CLINIC_OR_DEPARTMENT_OTHER): Payer: Medicare Other | Admitting: Anesthesiology

## 2015-03-12 ENCOUNTER — Encounter (HOSPITAL_BASED_OUTPATIENT_CLINIC_OR_DEPARTMENT_OTHER): Payer: Self-pay | Admitting: *Deleted

## 2015-03-12 ENCOUNTER — Encounter (HOSPITAL_BASED_OUTPATIENT_CLINIC_OR_DEPARTMENT_OTHER)
Admission: RE | Disposition: A | Discharge status: Home / Self Care | Payer: Self-pay | Source: Ambulatory Visit | Attending: Foot & Ankle Surgery

## 2015-03-12 ENCOUNTER — Ambulatory Visit (HOSPITAL_BASED_OUTPATIENT_CLINIC_OR_DEPARTMENT_OTHER)
Admission: RE | Admit: 2015-03-12 | Discharge: 2015-03-12 | Disposition: A | Discharge status: Home / Self Care | Payer: Medicare Other | Source: Ambulatory Visit | Attending: Foot & Ankle Surgery | Admitting: Foot & Ankle Surgery

## 2015-03-12 DIAGNOSIS — E1140 Type 2 diabetes mellitus with diabetic neuropathy, unspecified: Secondary | ICD-10-CM | POA: Diagnosis not present

## 2015-03-12 DIAGNOSIS — M869 Osteomyelitis, unspecified: Secondary | ICD-10-CM | POA: Diagnosis not present

## 2015-03-12 DIAGNOSIS — N183 Chronic kidney disease, stage 3 (moderate): Secondary | ICD-10-CM | POA: Diagnosis not present

## 2015-03-12 DIAGNOSIS — Z794 Long term (current) use of insulin: Secondary | ICD-10-CM | POA: Diagnosis not present

## 2015-03-12 DIAGNOSIS — G4730 Sleep apnea, unspecified: Secondary | ICD-10-CM | POA: Insufficient documentation

## 2015-03-12 DIAGNOSIS — M86171 Other acute osteomyelitis, right ankle and foot: Secondary | ICD-10-CM | POA: Diagnosis not present

## 2015-03-12 DIAGNOSIS — M86671 Other chronic osteomyelitis, right ankle and foot: Secondary | ICD-10-CM | POA: Diagnosis not present

## 2015-03-12 DIAGNOSIS — L84 Corns and callosities: Secondary | ICD-10-CM | POA: Diagnosis not present

## 2015-03-12 DIAGNOSIS — I2510 Atherosclerotic heart disease of native coronary artery without angina pectoris: Secondary | ICD-10-CM | POA: Diagnosis not present

## 2015-03-12 DIAGNOSIS — M879 Osteonecrosis, unspecified: Secondary | ICD-10-CM | POA: Diagnosis not present

## 2015-03-12 DIAGNOSIS — L03031 Cellulitis of right toe: Secondary | ICD-10-CM

## 2015-03-12 DIAGNOSIS — L602 Onychogryphosis: Secondary | ICD-10-CM | POA: Insufficient documentation

## 2015-03-12 DIAGNOSIS — I10 Essential (primary) hypertension: Secondary | ICD-10-CM | POA: Insufficient documentation

## 2015-03-12 DIAGNOSIS — E11621 Type 2 diabetes mellitus with foot ulcer: Secondary | ICD-10-CM | POA: Diagnosis not present

## 2015-03-12 DIAGNOSIS — Z6833 Body mass index (BMI) 33.0-33.9, adult: Secondary | ICD-10-CM | POA: Diagnosis not present

## 2015-03-12 DIAGNOSIS — I129 Hypertensive chronic kidney disease with stage 1 through stage 4 chronic kidney disease, or unspecified chronic kidney disease: Secondary | ICD-10-CM | POA: Diagnosis not present

## 2015-03-12 DIAGNOSIS — Z79899 Other long term (current) drug therapy: Secondary | ICD-10-CM | POA: Insufficient documentation

## 2015-03-12 DIAGNOSIS — Z87891 Personal history of nicotine dependence: Secondary | ICD-10-CM | POA: Insufficient documentation

## 2015-03-12 DIAGNOSIS — E1151 Type 2 diabetes mellitus with diabetic peripheral angiopathy without gangrene: Secondary | ICD-10-CM | POA: Diagnosis not present

## 2015-03-12 DIAGNOSIS — S98111A Complete traumatic amputation of right great toe, initial encounter: Secondary | ICD-10-CM | POA: Diagnosis not present

## 2015-03-12 HISTORY — PX: AMPUTATION TOE: SHX6595

## 2015-03-12 HISTORY — DX: Obstructive sleep apnea (adult) (pediatric): G47.33

## 2015-03-12 HISTORY — DX: Obstructive sleep apnea (adult) (pediatric): Z99.89

## 2015-03-12 LAB — POCT I-STAT 4, (NA,K, GLUC, HGB,HCT)
Glucose, Bld: 173 mg/dL — ABNORMAL HIGH (ref 65–99)
HCT: 31 % — ABNORMAL LOW (ref 39.0–52.0)
Hemoglobin: 10.5 g/dL — ABNORMAL LOW (ref 13.0–17.0)
Potassium: 4.6 mmol/L (ref 3.5–5.1)
Sodium: 138 mmol/L (ref 135–145)

## 2015-03-12 LAB — GLUCOSE, CAPILLARY: Glucose-Capillary: 148 mg/dL — ABNORMAL HIGH (ref 65–99)

## 2015-03-12 SURGERY — AMPUTATION, TOE
Anesthesia: Monitor Anesthesia Care | Site: Toe | Laterality: Right

## 2015-03-12 MED ORDER — LIDOCAINE HCL (CARDIAC) 20 MG/ML IV SOLN
INTRAVENOUS | Status: DC | PRN
  Administered 2015-03-12: 50 mg via INTRAVENOUS

## 2015-03-12 MED ORDER — MORPHINE SULFATE (PF) 2 MG/ML IV SOLN
2.0000 mg | INTRAVENOUS | Status: DC | PRN
Start: 2015-03-12 — End: 2015-03-12
  Filled 2015-03-12: qty 1

## 2015-03-12 MED ORDER — HYDROCODONE-ACETAMINOPHEN 5-325 MG PO TABS
1.0000 | ORAL_TABLET | ORAL | Status: DC | PRN
  Filled 2015-03-12: qty 1

## 2015-03-12 MED ORDER — PROPOFOL 500 MG/50ML IV EMUL
INTRAVENOUS | Status: DC | PRN
  Administered 2015-03-12: 25 ug/kg/min via INTRAVENOUS

## 2015-03-12 MED ORDER — FENTANYL CITRATE (PF) 100 MCG/2ML IJ SOLN
INTRAMUSCULAR | Status: DC | PRN
  Administered 2015-03-12: 100 ug via INTRAVENOUS

## 2015-03-12 MED ORDER — SODIUM CHLORIDE 0.9 % IV SOLN
250.0000 mL | INTRAVENOUS | Status: DC | PRN
  Filled 2015-03-12: qty 250

## 2015-03-12 MED ORDER — MORPHINE SULFATE (PF) 4 MG/ML IV SOLN
4.0000 mg | INTRAVENOUS | Status: DC | PRN
  Filled 2015-03-12: qty 1

## 2015-03-12 MED ORDER — FENTANYL CITRATE (PF) 100 MCG/2ML IJ SOLN
INTRAMUSCULAR | Status: AC
  Filled 2015-03-12: qty 4

## 2015-03-12 MED ORDER — LIDOCAINE HCL 2 % IJ SOLN
INTRAMUSCULAR | Status: DC | PRN
  Administered 2015-03-12: 5 mL

## 2015-03-12 MED ORDER — SODIUM CHLORIDE 0.9 % IJ SOLN
3.0000 mL | INTRAMUSCULAR | Status: DC | PRN
  Filled 2015-03-12: qty 3

## 2015-03-12 MED ORDER — PROPOFOL 10 MG/ML IV BOLUS
INTRAVENOUS | Status: DC | PRN
  Administered 2015-03-12 (×2): 20 mg via INTRAVENOUS

## 2015-03-12 MED ORDER — CEFAZOLIN SODIUM-DEXTROSE 2-3 GM-% IV SOLR
2.0000 g | INTRAVENOUS | Status: DC
  Filled 2015-03-12: qty 50

## 2015-03-12 MED ORDER — LACTATED RINGERS IV SOLN
INTRAVENOUS | Status: DC
Start: 2015-03-12 — End: 2015-03-12
  Administered 2015-03-12: 07:00:00 via INTRAVENOUS
  Filled 2015-03-12: qty 1000

## 2015-03-12 MED ORDER — SODIUM CHLORIDE 0.9 % IJ SOLN
3.0000 mL | Freq: Two times a day (BID) | INTRAMUSCULAR | Status: DC
  Filled 2015-03-12: qty 3

## 2015-03-12 MED ORDER — PROMETHAZINE HCL 12.5 MG PO TABS
12.5000 mg | ORAL_TABLET | ORAL | Status: DC | PRN
  Filled 2015-03-12: qty 1

## 2015-03-12 MED ORDER — ACETAMINOPHEN 325 MG PO TABS
650.0000 mg | ORAL_TABLET | ORAL | Status: DC | PRN
  Filled 2015-03-12: qty 2

## 2015-03-12 MED ORDER — OXYCODONE-ACETAMINOPHEN 5-325 MG PO TABS
1.0000 | ORAL_TABLET | ORAL | Status: DC | PRN
  Filled 2015-03-12: qty 2

## 2015-03-12 MED ORDER — MEPERIDINE HCL 25 MG/ML IJ SOLN
6.2500 mg | INTRAMUSCULAR | Status: DC | PRN
Start: 2015-03-12 — End: 2015-03-12
  Filled 2015-03-12: qty 1

## 2015-03-12 MED ORDER — FENTANYL CITRATE (PF) 100 MCG/2ML IJ SOLN
25.0000 ug | INTRAMUSCULAR | Status: DC | PRN
  Filled 2015-03-12: qty 1

## 2015-03-12 MED ORDER — TRAZODONE 25 MG HALF TABLET
25.0000 mg | ORAL_TABLET | Freq: Every evening | ORAL | Status: DC | PRN
  Filled 2015-03-12: qty 1

## 2015-03-12 MED ORDER — BUPIVACAINE HCL 0.5 % IJ SOLN
INTRAMUSCULAR | Status: DC | PRN
  Administered 2015-03-12: 5 mL

## 2015-03-12 MED ORDER — DEXTROSE 5 % IV SOLN
3.0000 g | Freq: Once | INTRAVENOUS | Status: AC
  Administered 2015-03-12: 3 g via INTRAVENOUS
  Filled 2015-03-12: qty 3000

## 2015-03-12 MED ORDER — PROMETHAZINE HCL 25 MG/ML IJ SOLN
6.2500 mg | INTRAMUSCULAR | Status: DC | PRN
  Filled 2015-03-12: qty 1

## 2015-03-12 SURGICAL SUPPLY — 68 items
BANDAGE CO FLEX L/F 2IN X 5YD (GAUZE/BANDAGES/DRESSINGS) ×2 IMPLANT
BANDAGE COBAN STERILE 2 (GAUZE/BANDAGES/DRESSINGS) ×2 IMPLANT
BANDAGE ELASTIC 6 VELCRO ST LF (GAUZE/BANDAGES/DRESSINGS) ×2 IMPLANT
BENZOIN TINCTURE PRP APPL 2/3 (GAUZE/BANDAGES/DRESSINGS) ×2 IMPLANT
BLADE AVERAGE 25X9 (BLADE) ×2 IMPLANT
BLADE FLAT COURSE (BLADE) IMPLANT
BLADE MICRO SAGITTAL (BLADE) ×2 IMPLANT
BLADE OSC/SAGITTAL MD 5.5X18 (BLADE) ×2 IMPLANT
BLADE SURG 15 STRL LF DISP TIS (BLADE) ×4 IMPLANT
BLADE SURG 15 STRL SS (BLADE) ×4
BNDG CONFORM 2 STRL LF (GAUZE/BANDAGES/DRESSINGS) ×2 IMPLANT
BNDG ESMARK 4X9 LF (GAUZE/BANDAGES/DRESSINGS) ×2 IMPLANT
CLOTH BEACON ORANGE TIMEOUT ST (SAFETY) ×2 IMPLANT
COVER BACK TABLE 60X90IN (DRAPES) ×2 IMPLANT
COVER MAYO STAND STRL (DRAPES) ×2 IMPLANT
DRAPE EXTREMITY T 121X128X90 (DRAPE) ×2 IMPLANT
DRAPE LG THREE QUARTER DISP (DRAPES) ×2 IMPLANT
DRAPE OEC MINIVIEW 54X84 (DRAPES) ×2 IMPLANT
DURAPREP 26ML APPLICATOR (WOUND CARE) ×2 IMPLANT
ELECT REM PT RETURN 9FT ADLT (ELECTROSURGICAL) ×2
ELECTRODE REM PT RTRN 9FT ADLT (ELECTROSURGICAL) ×1 IMPLANT
GAUZE PACKING IODOFORM 1/4X15 (GAUZE/BANDAGES/DRESSINGS) ×2 IMPLANT
GAUZE SPONGE 4X4 16PLY XRAY LF (GAUZE/BANDAGES/DRESSINGS) IMPLANT
GAUZE XEROFORM 1X8 LF (GAUZE/BANDAGES/DRESSINGS) ×2 IMPLANT
GLOVE BIO SURGEON STRL SZ 6.5 (GLOVE) ×2 IMPLANT
GLOVE BIO SURGEON STRL SZ7.5 (GLOVE) ×2 IMPLANT
GLOVE BIOGEL PI IND STRL 6.5 (GLOVE) ×1 IMPLANT
GLOVE BIOGEL PI IND STRL 7.5 (GLOVE) ×1 IMPLANT
GLOVE BIOGEL PI INDICATOR 6.5 (GLOVE) ×1
GLOVE BIOGEL PI INDICATOR 7.5 (GLOVE) ×1
GLOVE INDICATOR 7.5 STRL GRN (GLOVE) ×2 IMPLANT
GOWN STRL REUS W/TWL LRG LVL3 (GOWN DISPOSABLE) ×2 IMPLANT
KIT ROOM TURNOVER WOR (KITS) ×2 IMPLANT
KWIRE 4.0 X .045IN (WIRE) IMPLANT
LOOP VESSEL MAXI BLUE (MISCELLANEOUS) IMPLANT
MANIFOLD NEPTUNE II (INSTRUMENTS) IMPLANT
NDL SAFETY ECLIPSE 18X1.5 (NEEDLE) ×1 IMPLANT
NEEDLE 27GAX1X1/2 (NEEDLE) ×2 IMPLANT
NEEDLE HYPO 18GX1.5 SHARP (NEEDLE) ×1
NEEDLE HYPO 22GX1.5 SAFETY (NEEDLE) ×2 IMPLANT
NS IRRIG 500ML POUR BTL (IV SOLUTION) ×2 IMPLANT
PACK BASIN DAY SURGERY FS (CUSTOM PROCEDURE TRAY) ×2 IMPLANT
PADDING CAST ABS 3INX4YD NS (CAST SUPPLIES) ×1
PADDING CAST ABS 4INX4YD NS (CAST SUPPLIES) ×1
PADDING CAST ABS COTTON 3X4 (CAST SUPPLIES) ×1 IMPLANT
PADDING CAST ABS COTTON 4X4 ST (CAST SUPPLIES) ×1 IMPLANT
PENCIL BUTTON HOLSTER BLD 10FT (ELECTRODE) ×2 IMPLANT
RASP SM TEAR CROSS CUT (RASP) IMPLANT
SCOTCHCAST PLUS 5X4 WHITE (CAST SUPPLIES) IMPLANT
SPONGE GAUZE 4X4 12PLY (GAUZE/BANDAGES/DRESSINGS) ×4 IMPLANT
SPONGE GAUZE 4X4 12PLY STER LF (GAUZE/BANDAGES/DRESSINGS) ×2 IMPLANT
SPONGE LAP 4X18 X RAY DECT (DISPOSABLE) ×2 IMPLANT
STOCKINETTE 6  STRL (DRAPES) ×1
STOCKINETTE 6 STRL (DRAPES) ×1 IMPLANT
STRIP CLOSURE SKIN 1/4X4 (GAUZE/BANDAGES/DRESSINGS) IMPLANT
SUCTION FRAZIER TIP 10 FR DISP (SUCTIONS) ×2 IMPLANT
SUT ETHILON 4 0 PS 2 18 (SUTURE) ×4 IMPLANT
SUT ETHILON 5 0 PS 2 18 (SUTURE) IMPLANT
SUT MNCRL AB 4-0 PS2 18 (SUTURE) IMPLANT
SUT VIC AB 3-0 SH 27 (SUTURE) ×1
SUT VIC AB 3-0 SH 27X BRD (SUTURE) ×1 IMPLANT
SUT VICRYL 4-0 PS2 18IN ABS (SUTURE) IMPLANT
SYR 3ML 18GX1 1/2 (SYRINGE) ×2 IMPLANT
SYR BULB 3OZ (MISCELLANEOUS) ×2 IMPLANT
SYR CONTROL 10ML LL (SYRINGE) ×4 IMPLANT
TUBE CONNECTING 12X1/4 (SUCTIONS) ×2 IMPLANT
UNDERPAD 30X30 INCONTINENT (UNDERPADS AND DIAPERS) ×2 IMPLANT
WATER STERILE IRR 500ML POUR (IV SOLUTION) ×2 IMPLANT

## 2015-03-12 NOTE — Brief Op Note (Signed)
03/12/2015  12:38 PM  PATIENT:  [name]  74 y.o. male  PRE-OPERATIVE DIAGNOSIS:  osteomylitis  POST-OPERATIVE DIAGNOSIS:  osteomylitis  PROCEDURE:  Procedure(s): RIGHT HALLUS AMPUTATION  (Right)  SURGEON:  Surgeon(s) and Role:    * [name], MD - Primary  PHYSICIAN ASSISTANT:   ASSISTANTS: none   ANESTHESIA:   MAC  EBL:  Total I/O In: [id number] [P.O.:240; I.V.:800] Out: -   BLOOD ADMINISTERED:none  DRAINS: none   LOCAL MEDICATIONS USED:  MARCAINE     SPECIMEN:  Source of Specimen:  right hallux  DISPOSITION OF SPECIMEN:  PATHOLOGY  COUNTS:  YES  TOURNIQUET:   Total Tourniquet Time Documented: Calf (Right) - 21 minutes Total: Calf (Right) - 21 minutes   DICTATION: .Note written in paper chart and Note written in EPIC  PLAN OF CARE: Discharge to home after PACU  PATIENT DISPOSITION:  PACU - hemodynamically stable.   Delay start of Pharmacological VTE agent (>24hrs) due to surgical blood loss or risk of bleeding: yes

## 2015-03-12 NOTE — Anesthesia Postprocedure Evaluation (Signed)
  Anesthesia Post-op Note  Patient: David [name]  Procedure(s) Performed: Procedure(s) (LRB): RIGHT HALLUS AMPUTATION  (Right)  Patient Location: PACU  Anesthesia Type: MAC  Level of Consciousness: awake and alert   Airway and Oxygen Therapy: Patient Spontanous Breathing  Post-op Pain: mild  Post-op Assessment: Post-op Vital signs reviewed, Patient's Cardiovascular Status Stable, Respiratory Function Stable, Patent Airway and No signs of Nausea or vomiting  Last Vitals:  Filed Vitals:   03/12/15 0900  BP: 128/47  Pulse: 60  Temp:   Resp: 18    Post-op Vital Signs: stable   Complications: No apparent anesthesia complications

## 2015-03-12 NOTE — Transfer of Care (Signed)
Immediate Anesthesia Transfer of Care Note  Patient: David [name]  Procedure(s) Performed: Procedure(s): RIGHT HALLUS AMPUTATION  (Right)  Patient Location: PACU  Anesthesia Type:MAC  Level of Consciousness: awake, alert  and oriented  Airway & Oxygen Therapy: Patient Spontanous Breathing and Patient connected to face mask oxygen  Post-op Assessment: Report given to RN  Post vital signs: Reviewed and stable  Last Vitals:  Filed Vitals:   03/12/15 0612  BP: 165/47  Pulse: 63  Temp: 36.7 C  Resp: 18    Complications: No apparent anesthesia complications

## 2015-03-12 NOTE — Op Note (Signed)
PREOPERATIVE DIAGNOSIS:  1. Osteomyelitis Right hallux 2. Diabetes with neuropathy  POSTOPERATIVE DIAGNOSIS: SAME OPERATION: Partial Right Hallux amputation ANESTHESIA:  Local with IV MAC sedation SURGEON: N'Tuma Analiza Cowger ASSISTANTS: N/A HEMOSTASIS: Right ankle tourniquet at 231mmgh ESTIMATED BLOOD LOSS: MINIMAL MATERIALS:  Iodoform packing, 3-0 vicryl and 4-0 nylon PATHOLOGY: Bone and also Clean Margin was sent for a total of 2 specimens COMPLICATIONS: None CONDITIONS: Stable  The patient was brought to the operating room and placed on the operating table in the supine position. Following IV sedation local anesthesia was obtained utilizing 10cc of a 1:1 mixture of 2% lidocaine plain and 0.5% Marcaine plain.  The foot was then scrubbed, prepped, and draped in the usual aseptic manner.  Attention was then directed to the Right Hallux which was noted to be osteomyelitic with a fibrotic draining ulceration at the plantar aspect.  A fish mouth incision was made circumferenting the hallux at the Proximal Interphalangeal joint.  This incision was then sharply carried down to bone which was found to be necrotic and soft.  The bone was then resected proximally to ensure that all necrotic bone was taken.  The bone was then cut through and through using an oscillating saw.  The distal part of the toe was then taken from the operative field and sent to pathology.  More proximal bone was cut until it was noted that the bone was viable, hard, and bleeding.  The amputation site was copiously irrigated with sterile saline and then further debrided deeply and superficially carefully removing all necrotic and nonviable tissue.  The amputation site was then again flushed with copious amounts of sterile saline.   Then a clean margin of the remaining bone was obtained and sent for pathology.   Deep soft tissue was re-approximated with 3-0 Vicryl, as was superficial soft tissue.  Skin edges were re-approximated with  4-0 nylon with  Iodoform packing placed.    The patient tolerated the procedure well and was transferred to the recovery room with all vital signs stable.   Before the procedure, the patient was noted to have a hemorrhagic intact blister at the medial aspect of the 1st metatarsal head.   The foot was then dressed with non adherent dressing, betadine soaked gauze, kerlix and coban.  The tourniquet was then deflated and immediate hyperemic response was noted to all the digits of the right foot.     The patient tolerated the procedure well and was transferred to the recovery room with all vital signs stable.

## 2015-03-12 NOTE — Discharge Instructions (Signed)
Follow up with Dr. Melony Overly 10/31  At [place] c/d/i REstart Levaquin tomorrow 03/13/15 Keep foot elevated above heart level Minimal weight bearing to right foot Call [phone] with any questions.   Call your surgeon if you experience:   1.  Fever over 101.0. 2.  Inability to urinate. 3.  Nausea and/or vomiting. 4.  Extreme swelling or bruising at the surgical site. 5.  Continued bleeding from the incision. 6.  Increased pain, redness or drainage from the incision. 7.  Problems related to your pain medication. 8. Any change in color, movement and/or sensation 9. Any problems and/or concerns    Post Anesthesia Home Care Instructions  Activity: Get plenty of rest for the remainder of the day. A responsible adult should stay with you for 24 hours following the procedure.  For the next 24 hours, DO NOT: -Drive a car -Paediatric nurse -Drink alcoholic beverages -Take any medication unless instructed by your physician -Make any legal decisions or sign important papers.  Meals: Start with liquid foods such as gelatin or soup. Progress to regular foods as tolerated. Avoid greasy, spicy, heavy foods. If nausea and/or vomiting occur, drink only clear liquids until the nausea and/or vomiting subsides. Call your physician if vomiting continues.  Special Instructions/Symptoms: Your throat may feel dry or sore from the anesthesia or the breathing tube placed in your throat during surgery. If this causes discomfort, gargle with warm salt water. The discomfort should disappear within 24 hours.  If you had a scopolamine patch placed behind your ear for the management of post- operative nausea and/or vomiting:  1. The medication in the patch is effective for 72 hours, after which it should be removed.  Wrap patch in a tissue and discard in the trash. Wash hands thoroughly with soap and water. 2. You may remove the patch earlier than 72 hours if you experience unpleasant  side effects which may include dry mouth, dizziness or visual disturbances. 3. Avoid touching the patch. Wash your hands with soap and water after contact with the patch.

## 2015-03-12 NOTE — Anesthesia Preprocedure Evaluation (Signed)
Anesthesia Evaluation  Patient identified by MRN, date of birth, ID band Patient awake    Reviewed: Allergy & Precautions, NPO status , Patient's Chart, lab work & pertinent test results  Airway Mallampati: II  TM Distance: >3 FB Neck ROM: Full    Dental  (+) Upper Dentures, Lower Dentures, Dental Advisory Given   Pulmonary sleep apnea and Continuous Positive Airway Pressure Ventilation , former smoker,    breath sounds clear to auscultation       Cardiovascular hypertension, Pt. on medications and Pt. on home beta blockers + CAD  + pacemaker + Valvular Problems/Murmurs AI  Rhythm:Regular Rate:Normal     Neuro/Psych  Neuromuscular disease negative psych ROS   GI/Hepatic negative GI ROS, Neg liver ROS,   Endo/Other  diabetes, Well Controlled, Type 2, Insulin DependentMorbid obesity  Renal/GU Renal InsufficiencyRenal disease  negative genitourinary   Musculoskeletal negative musculoskeletal ROS (+)   Abdominal   Peds negative pediatric ROS (+)  Hematology negative hematology ROS (+)   Anesthesia Other Findings   Reproductive/Obstetrics negative OB ROS                             Anesthesia Physical  Anesthesia Plan  ASA: III  Anesthesia Plan: General and MAC   Post-op Pain Management:    Induction: Intravenous  Airway Management Planned: LMA and Simple Face Mask  Additional Equipment:   Intra-op Plan:   Post-operative Plan: Extubation in OR  Informed Consent: I have reviewed the patients History and Physical, chart, labs and discussed the procedure including the risks, benefits and alternatives for the proposed anesthesia with the patient or authorized representative who has indicated his/her understanding and acceptance.   Dental advisory given  Plan Discussed with: CRNA, Anesthesiologist and Surgeon  Anesthesia Plan Comments: (MAc or GA)        Anesthesia Quick  Evaluation

## 2015-03-12 NOTE — Anesthesia Procedure Notes (Signed)
Procedure Name: MAC Date/Time: 03/12/2015 7:30 AM Performed by: Bethena Roys T Oxygen Delivery Method: Simple face mask Placement Confirmation: positive ETCO2 and CO2 detector

## 2015-03-13 ENCOUNTER — Encounter (HOSPITAL_BASED_OUTPATIENT_CLINIC_OR_DEPARTMENT_OTHER): Payer: Self-pay | Admitting: Foot & Ankle Surgery

## 2015-03-16 DIAGNOSIS — E1140 Type 2 diabetes mellitus with diabetic neuropathy, unspecified: Secondary | ICD-10-CM | POA: Diagnosis not present

## 2015-03-16 DIAGNOSIS — M8610 Other acute osteomyelitis, unspecified site: Secondary | ICD-10-CM | POA: Diagnosis not present

## 2015-03-18 ENCOUNTER — Encounter: Payer: Medicare Other | Admitting: Nutrition

## 2015-03-19 ENCOUNTER — Other Ambulatory Visit: Payer: Self-pay

## 2015-03-19 ENCOUNTER — Encounter: Payer: Self-pay | Admitting: Family

## 2015-03-23 ENCOUNTER — Other Ambulatory Visit: Payer: Self-pay

## 2015-03-23 MED ORDER — CARVEDILOL 25 MG PO TABS: 25.0000 mg | ORAL_TABLET | Freq: Two times a day (BID) | ORAL | Status: DC

## 2015-03-24 ENCOUNTER — Encounter: Payer: Self-pay | Admitting: Family

## 2015-03-24 DIAGNOSIS — T8740 Infection of amputation stump, unspecified extremity: Secondary | ICD-10-CM | POA: Diagnosis not present

## 2015-03-24 DIAGNOSIS — T8781 Dehiscence of amputation stump: Secondary | ICD-10-CM | POA: Diagnosis not present

## 2015-03-24 MED ORDER — CARVEDILOL 25 MG PO TABS: 25.0000 mg | ORAL_TABLET | Freq: Two times a day (BID) | ORAL | Status: DC

## 2015-03-27 DIAGNOSIS — T8740 Infection of amputation stump, unspecified extremity: Secondary | ICD-10-CM | POA: Diagnosis not present

## 2015-03-27 DIAGNOSIS — T8781 Dehiscence of amputation stump: Secondary | ICD-10-CM | POA: Diagnosis not present

## 2015-03-30 ENCOUNTER — Telehealth: Payer: Self-pay | Admitting: Endocrinology

## 2015-03-30 NOTE — Telephone Encounter (Signed)
Cindy from [facility] calling for particulars on pt's pump please call back [phone]

## 2015-04-01 DIAGNOSIS — T8781 Dehiscence of amputation stump: Secondary | ICD-10-CM | POA: Diagnosis not present

## 2015-04-01 DIAGNOSIS — T8740 Infection of amputation stump, unspecified extremity: Secondary | ICD-10-CM | POA: Diagnosis not present

## 2015-04-02 DIAGNOSIS — T8781 Dehiscence of amputation stump: Secondary | ICD-10-CM | POA: Diagnosis not present

## 2015-04-02 DIAGNOSIS — T8740 Infection of amputation stump, unspecified extremity: Secondary | ICD-10-CM | POA: Diagnosis not present

## 2015-04-06 ENCOUNTER — Encounter: Payer: Self-pay | Admitting: Infectious Diseases

## 2015-04-06 ENCOUNTER — Encounter: Payer: Self-pay | Admitting: *Deleted

## 2015-04-06 ENCOUNTER — Telehealth: Payer: Self-pay | Admitting: *Deleted

## 2015-04-06 ENCOUNTER — Ambulatory Visit (INDEPENDENT_AMBULATORY_CARE_PROVIDER_SITE_OTHER): Payer: Medicare Other | Admitting: Infectious Diseases

## 2015-04-06 VITALS — BP 114/83 | HR 85 | Temp 98.2°F | Ht 76.0 in | Wt 279.1 lb

## 2015-04-06 DIAGNOSIS — Z794 Long term (current) use of insulin: Secondary | ICD-10-CM | POA: Diagnosis not present

## 2015-04-06 DIAGNOSIS — L03031 Cellulitis of right toe: Secondary | ICD-10-CM

## 2015-04-06 DIAGNOSIS — E118 Type 2 diabetes mellitus with unspecified complications: Secondary | ICD-10-CM | POA: Diagnosis not present

## 2015-04-06 DIAGNOSIS — N289 Disorder of kidney and ureter, unspecified: Secondary | ICD-10-CM

## 2015-04-06 NOTE — Assessment & Plan Note (Signed)
Sugars fairly well controlled by his Hx.  He has gotten flu shot.

## 2015-04-06 NOTE — Assessment & Plan Note (Signed)
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  Will check CT without.  Will send him for ABI and possibly to vascular rtc in 3 weeks.

## 2015-04-06 NOTE — Progress Notes (Addendum)
   Subjective:    Patient ID: David [name], male    DOB: April 18, 1941, 74 y.o.   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  He was hospitalized in June 2016 and had wound Cx which grew pseudomonas.  He had cx of this wound on 10-21 that grew CNS, and diphtheroids x 2.  He underwent R great toe partial amputation on 10-27. His bone bx confirmed osteo, there is no Cx from this in EPIC or in the records that were sent with him..  Per he and his wife he has had incomplete wound healing and had "yellow, gunky" stuff from his wound.  No fever/chills. No LE erythema.   Has been on anbx (levaquin) for ~ 1 month.   PMHx, Sochx, FHx reviewed and updated in EPIC.   Review of Systems  Constitutional: Negative for fever and chills.  Eyes: Negative for visual disturbance.  Respiratory: Negative for cough and shortness of breath.   Cardiovascular: Negative for chest pain.  Gastrointestinal: Negative for diarrhea and constipation.  Genitourinary: Negative for difficulty urinating.  Neurological: Positive for numbness. Negative for headaches.  FSG avg 130.     Objective:   Physical Exam  Constitutional: He appears well-developed and well-nourished.  HENT:  Mouth/Throat: No oropharyngeal exudate.  Eyes: EOM are normal. Pupils are equal, round, and reactive to light.  Neck: Neck supple.  Cardiovascular: Normal rate, regular rhythm and normal heart sounds.   Pulmonary/Chest: Effort normal and breath sounds normal.    Abdominal: Soft. Bowel sounds are normal. He exhibits no distension. There is no tenderness.  Musculoskeletal: He exhibits edema.       Feet:  Lymphadenopathy:    He has no cervical adenopathy.        Assessment & Plan:

## 2015-04-06 NOTE — Assessment & Plan Note (Addendum)
Will speak with renal prior to [facility], home anbx.   Get central [facility]

## 2015-04-07 ENCOUNTER — Telehealth: Payer: Self-pay | Admitting: *Deleted

## 2015-04-07 DIAGNOSIS — T8781 Dehiscence of amputation stump: Secondary | ICD-10-CM | POA: Diagnosis not present

## 2015-04-07 DIAGNOSIS — T8740 Infection of amputation stump, unspecified extremity: Secondary | ICD-10-CM | POA: Diagnosis not present

## 2015-04-07 NOTE — Telephone Encounter (Signed)
Patient is scheduled for ABI per Dr. Algis Downs orders.  ABI is scheduled 11/23 9:00 at [facility].  No preparation is necessary.  Patient should go to the [facility] on [address], check in at the desk for the ABI appointment.  RN unable to speak with patient, left message on his mobile.  If he needs to change the appointment, he can call [phone].  MyChart message sent to patient.

## 2015-04-07 NOTE — Addendum Note (Signed)
Addended by: Landis Gandy on: 04/07/2015 08:59 AM   Modules accepted: Orders

## 2015-04-08 ENCOUNTER — Ambulatory Visit (HOSPITAL_COMMUNITY)
Admission: RE | Admit: 2015-04-08 | Discharge: 2015-04-08 | Disposition: A | Discharge status: Home / Self Care | Payer: Medicare Other | Source: Ambulatory Visit | Attending: Infectious Diseases | Admitting: Infectious Diseases

## 2015-04-08 DIAGNOSIS — I708 Atherosclerosis of other arteries: Secondary | ICD-10-CM | POA: Diagnosis not present

## 2015-04-08 DIAGNOSIS — L03031 Cellulitis of right toe: Secondary | ICD-10-CM | POA: Diagnosis not present

## 2015-04-08 NOTE — Progress Notes (Signed)
VASCULAR LAB PRELIMINARY  ARTERIAL  ABI completed: Bilateral ABIs are falsely elevated due to noncompressible calcified vessels.  Abnormal monophasic waveforms are suggestive of moderate to severe arterial diease.   RIGHT    LEFT    PRESSURE WAVEFORM  PRESSURE WAVEFORM  BRACHIAL 169 Normal BRACHIAL 165 Normal  DP 206 Monophasic DP 169 Monophasic  PT 255 Bi/mono PT 249 Bi/mono  PER   PER    GREAT TOE 0 NA GREAT TOE .20 NA    RIGHT LEFT  ABI       Elizabeth Haff, Bonnye Fava, RVT 04/08/2015, 10:03 AM

## 2015-04-10 DIAGNOSIS — T8740 Infection of amputation stump, unspecified extremity: Secondary | ICD-10-CM | POA: Diagnosis not present

## 2015-04-10 DIAGNOSIS — T8781 Dehiscence of amputation stump: Secondary | ICD-10-CM | POA: Diagnosis not present

## 2015-04-13 NOTE — Progress Notes (Signed)
Patient has been seen by vascular. David [name]

## 2015-04-14 ENCOUNTER — Telehealth: Payer: Self-pay | Admitting: *Deleted

## 2015-04-14 ENCOUNTER — Other Ambulatory Visit: Payer: Self-pay | Admitting: Infectious Diseases

## 2015-04-14 DIAGNOSIS — I779 Disorder of arteries and arterioles, unspecified: Secondary | ICD-10-CM

## 2015-04-14 DIAGNOSIS — T8740 Infection of amputation stump, unspecified extremity: Secondary | ICD-10-CM | POA: Diagnosis not present

## 2015-04-14 DIAGNOSIS — T8781 Dehiscence of amputation stump: Secondary | ICD-10-CM | POA: Diagnosis not present

## 2015-04-14 NOTE — Telephone Encounter (Addendum)
Patient notified that a referral was made to Vascular Surgery and they will be calling him to schedule an appointment. I spoke with Vascular. Also notified of CT scan on Friday 04/17/15 at 9:45 AM at [facility]. Myrtis Hopping

## 2015-04-15 ENCOUNTER — Other Ambulatory Visit: Payer: Self-pay | Admitting: *Deleted

## 2015-04-17 ENCOUNTER — Ambulatory Visit (HOSPITAL_COMMUNITY)
Admission: RE | Admit: 2015-04-17 | Discharge: 2015-04-17 | Disposition: A | Discharge status: Home / Self Care | Payer: Medicare Other | Source: Ambulatory Visit | Attending: Infectious Diseases | Admitting: Infectious Diseases

## 2015-04-17 DIAGNOSIS — R937 Abnormal findings on diagnostic imaging of other parts of musculoskeletal system: Secondary | ICD-10-CM | POA: Diagnosis not present

## 2015-04-17 DIAGNOSIS — Z89419 Acquired absence of unspecified great toe: Secondary | ICD-10-CM | POA: Diagnosis not present

## 2015-04-17 DIAGNOSIS — L03039 Cellulitis of unspecified toe: Secondary | ICD-10-CM | POA: Diagnosis present

## 2015-04-17 DIAGNOSIS — L03031 Cellulitis of right toe: Secondary | ICD-10-CM | POA: Diagnosis not present

## 2015-04-17 DIAGNOSIS — T8781 Dehiscence of amputation stump: Secondary | ICD-10-CM | POA: Diagnosis not present

## 2015-04-17 DIAGNOSIS — Z89429 Acquired absence of other toe(s), unspecified side: Secondary | ICD-10-CM | POA: Diagnosis not present

## 2015-04-17 DIAGNOSIS — T8740 Infection of amputation stump, unspecified extremity: Secondary | ICD-10-CM | POA: Diagnosis not present

## 2015-04-21 DIAGNOSIS — T8781 Dehiscence of amputation stump: Secondary | ICD-10-CM | POA: Diagnosis not present

## 2015-04-21 DIAGNOSIS — T8740 Infection of amputation stump, unspecified extremity: Secondary | ICD-10-CM | POA: Diagnosis not present

## 2015-04-23 ENCOUNTER — Encounter: Payer: Self-pay | Admitting: Internal Medicine

## 2015-04-23 ENCOUNTER — Ambulatory Visit (INDEPENDENT_AMBULATORY_CARE_PROVIDER_SITE_OTHER): Payer: Medicare Other | Admitting: Internal Medicine

## 2015-04-23 VITALS — BP 128/62 | HR 70 | Ht 76.0 in | Wt 277.4 lb

## 2015-04-23 DIAGNOSIS — I2510 Atherosclerotic heart disease of native coronary artery without angina pectoris: Secondary | ICD-10-CM

## 2015-04-23 NOTE — Patient Instructions (Addendum)
Medication Instructions: Your physician recommends that you continue on your current medications as directed. Please refer to the Current Medication list given to you today.  Labwork: None ordered  Procedures/Testing: None ordered  Follow-Up: You have a device check (battery check) on 05/25/15.  Your physician wants you to follow-up in: 15 months with Dr. Caryl Comes. You will receive a reminder letter in the mail two months in advance. If you don't receive a letter, please call our office to schedule the follow-up appointment.

## 2015-04-23 NOTE — Telephone Encounter (Signed)
Ready to close °

## 2015-04-23 NOTE — Progress Notes (Signed)
Patient Care Team: [name], FNP as PCP - General (Family Medicine) [name], MD as Consulting Physician (Podiatry) [name], MD as Consulting Physician (Nephrology)   HPI  David [name] is a 74 y.o. male Seen in followup for pacemaker implanted for sinus node dysfunction and dyspnea on exertion. Cardiac-wise exhibited stable. He denies chest pain or limiting shortness of breath. There is mild edema.  Echocardiogram 6/15 normal LV function and severe left ventricular hypertrophy with left atrial enlargement consistent with hypertensive heart disease  Evaluation [place] included a Myoview that was false positive catheterizations were undertaken in 2009, then, 12 demonstrating 60% of the RCA, 70% in 240% circumflex and 60% OM1 40% LAD.   He has renal insufficiency grade 3 with a GFR 10/16 of 2.04. Potassium level was 5.8 around that same time; he is followed closely by nephrology   Intercurrently he has suffered from a pneumonia, a fractured leg that occurred in the hospital when he slipped on the floor, and infection in the contralateral foot.  Past Medical History  Diagnosis Date  . Hypertension   . Arthritis   . Anxiety   . Sinus node dysfunction (HCC)   . Cardiac pacemaker in situ   . CHF (congestive heart failure) ([place])   . CAD (coronary artery disease)     Nonobstructive CAD per cath  . Dyspnea   . Insulin dependent type 2 diabetes mellitus ([place]) 1991    followd by [name]--  has insulin pump  . Insulin pump in place   . Anemia, iron deficiency   . Secondary hyperparathyroidism of renal origin ([place])   . 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 History of skin cancer   . Chronic ulcer of right foot ([place])   . History of cellulitis     right great toe 10-25-2014  . BPH (benign prostatic hypertrophy)   . Peripheral neuropathy (HCC)     severe  . Peripheral vascular disease ([place])    bilateral lower extremities  . OSA on CPAP     Past Surgical History  Procedure Laterality Date  . Vein ligation and stripping    . 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 Orif ankle fracture Left 11/06/2014    Procedure: OPEN REDUCTION INTERNAL FIXATION (ORIF) LEFT  ANKLE FRACTURE;  Surgeon: [name], MD;  Location: [place];  Service: Orthopedics;  Laterality: Left;  . Cardiac catheterization  11-25-2010   [place], [place]    Nonobstructive CAD  . Cardiac pacemaker placement  Nov 2009    [employer]  . Total knee arthroplasty    . Excision bone cyst Right 03/06/2015    Procedure: BONE BIOPSIES OF RIGHT FOOT;  Surgeon: [name], MD;  Location: [place];  Service: Podiatry;  Laterality: Right;  . Amputation toe Right 03/12/2015    Procedure: RIGHT HALLUS AMPUTATION ;  Surgeon: [name], MD;  Location: [place];  Service: Podiatry;  Laterality: Right;    Current Outpatient Prescriptions  Medication Sig Dispense Refill  . acetaminophen (TYLENOL) 500 MG tablet Take 1,000 mg by mouth every 6 (six) hours as needed for fever.    Marland Kitchen aspirin EC 325 MG tablet Take 1 tablet (325 mg total) by mouth daily. 42 tablet 0  . calcitRIOL (ROCALTROL) 0.25 MCG capsule Take 0.25 mcg by mouth daily.     Marland Kitchen  carvedilol (COREG) 25 MG tablet Take 1 tablet (25 mg total) by mouth 2 (two) times daily with a meal. 90 tablet 1  . doxazosin (CARDURA) 4 MG tablet Take 1 tablet (4 mg total) by mouth daily. 90 tablet 1  . doxycycline (VIBRA-TABS) 100 MG tablet     . insulin aspart (NOVOLOG) 100 UNIT/ML injection Use max 120 units per day with insulin pump. 40 mL 5  . isosorbide mononitrate (IMDUR) 30 MG 24 hr tablet TAKE TWO TABLETS BY MOUTH ONCE DAILY 60 tablet 0  . levofloxacin (LEVAQUIN) 500 MG tablet Take 500 mg by mouth daily.    Marland Kitchen lisinopril (PRINIVIL,ZESTRIL) 10 MG tablet Take 1 tablet (10 mg total) by mouth daily. 90  tablet 0  . LORazepam (ATIVAN) 1 MG tablet     . neomycin-polymyxin-hydrocortisone (CORTISPORIN) otic solution Place 3 drops into the left ear 4 (four) times daily. 10 mL 0  . Omega-3 Fatty Acids (FISH OIL) 1000 MG CAPS Take by mouth 2 (two) times daily.    . pantoprazole (PROTONIX) 40 MG tablet Take 1 tablet (40 mg total) by mouth daily. 90 tablet 1  . SANTYL ointment Apply 1 application topically.  3  . sertraline (ZOLOFT) 50 MG tablet Take 1 tablet (50 mg) daily 90 tablet 0  . torsemide (DEMADEX) 20 MG tablet Take 2 tablets (40 mg total) by mouth daily. 180 tablet 1   No current facility-administered medications for this visit.    No Known Allergies  Review of Systems negative except from HPI and PMH  Physical Exam BP 128/62 mmHg  Pulse 70  Ht 6\' 4"  (1.93 m)  Wt 277 lb 6.4 oz (125.828 kg)  BMI 33.78 kg/m2  SpO2 96% Well developed and well nourished in no acute distress HENT normal E scleral and icterus clear Neck Supple JVP flat; carotids brisk and full Clear to ausculation  Regular rate and rhythm, no murmurs gallops or rub Soft with active bowel sounds No clubbing cyanosis 1+ Edema Alert and oriented, grossly normal motor and sensory function Skin Warm and Dry  ECG demonstrates atrial pacing at 67@intervals  24/12/44 Poor R-wave progression and inferolateral T wave is  Assessment and  Plan Sinus node dysfunction Pacemaker   Renal dysfunction  Hypertension  Sleep apnea-treated   Blood pressure is well-controlled  Pacemaker is approaching ERI. We have reviewed the impact of VVI pacing to help him be more aware  He will be followed monthly remotely

## 2015-04-24 DIAGNOSIS — T8781 Dehiscence of amputation stump: Secondary | ICD-10-CM | POA: Diagnosis not present

## 2015-04-24 DIAGNOSIS — T8740 Infection of amputation stump, unspecified extremity: Secondary | ICD-10-CM | POA: Diagnosis not present

## 2015-04-27 DIAGNOSIS — L97512 Non-pressure chronic ulcer of other part of right foot with fat layer exposed: Secondary | ICD-10-CM | POA: Diagnosis not present

## 2015-04-27 DIAGNOSIS — I70293 Other atherosclerosis of native arteries of extremities, bilateral legs: Secondary | ICD-10-CM | POA: Diagnosis not present

## 2015-04-27 DIAGNOSIS — L602 Onychogryphosis: Secondary | ICD-10-CM | POA: Diagnosis not present

## 2015-04-27 DIAGNOSIS — E1151 Type 2 diabetes mellitus with diabetic peripheral angiopathy without gangrene: Secondary | ICD-10-CM | POA: Diagnosis not present

## 2015-04-28 DIAGNOSIS — T8781 Dehiscence of amputation stump: Secondary | ICD-10-CM | POA: Diagnosis not present

## 2015-04-28 DIAGNOSIS — T8740 Infection of amputation stump, unspecified extremity: Secondary | ICD-10-CM | POA: Diagnosis not present

## 2015-04-29 ENCOUNTER — Encounter: Payer: Self-pay | Admitting: Vascular Surgery

## 2015-05-01 DIAGNOSIS — T8740 Infection of amputation stump, unspecified extremity: Secondary | ICD-10-CM | POA: Diagnosis not present

## 2015-05-01 DIAGNOSIS — T8781 Dehiscence of amputation stump: Secondary | ICD-10-CM | POA: Diagnosis not present

## 2015-05-04 ENCOUNTER — Ambulatory Visit (INDEPENDENT_AMBULATORY_CARE_PROVIDER_SITE_OTHER): Payer: Medicare Other | Admitting: Infectious Diseases

## 2015-05-04 VITALS — Ht 76.0 in | Wt 274.0 lb

## 2015-05-04 DIAGNOSIS — I2510 Atherosclerotic heart disease of native coronary artery without angina pectoris: Secondary | ICD-10-CM | POA: Diagnosis not present

## 2015-05-04 DIAGNOSIS — M86171 Other acute osteomyelitis, right ankle and foot: Secondary | ICD-10-CM | POA: Diagnosis present

## 2015-05-04 DIAGNOSIS — N289 Disorder of kidney and ureter, unspecified: Secondary | ICD-10-CM

## 2015-05-04 LAB — CUP PACEART INCLINIC DEVICE CHECK
Battery Impedance: 5557 Ohm
Battery Remaining Longevity: 2 mo
Battery Voltage: 2.63 V
Brady Statistic AP VP Percent: 32 %
Brady Statistic AP VS Percent: 62 %
Brady Statistic AS VP Percent: 5 %
Brady Statistic AS VS Percent: 1 %
Date Time Interrogation Session: 20161208204150
Implantable Lead Implant Date: 20091102
Implantable Lead Implant Date: 20091102
Implantable Lead Location: 753859
Implantable Lead Location: 753860
Implantable Lead Model: 5076
Implantable Lead Model: 5076
Lead Channel Impedance Value: 601 Ohm
Lead Channel Impedance Value: 697 Ohm
Lead Channel Pacing Threshold Amplitude: 0.75 V
Lead Channel Pacing Threshold Amplitude: 1.25 V
Lead Channel Pacing Threshold Pulse Width: 0.4 ms
Lead Channel Pacing Threshold Pulse Width: 1 ms
Lead Channel Sensing Intrinsic Amplitude: 15.67 mV
Lead Channel Sensing Intrinsic Amplitude: 2 mV
Lead Channel Setting Pacing Amplitude: 2.5 V
Lead Channel Setting Pacing Amplitude: 2.5 V
Lead Channel Setting Pacing Pulse Width: 0.4 ms
Lead Channel Setting Sensing Sensitivity: 5.6 mV

## 2015-05-04 MED ORDER — SODIUM CHLORIDE 0.9 % IV SOLN: 6.0000 mg/kg | INTRAVENOUS | Status: DC

## 2015-05-04 MED ORDER — CEFTAZIDIME 1 G IJ SOLR: INTRAMUSCULAR | Status: DC

## 2015-05-04 MED ORDER — CEFTAZIDIME 1 G IJ SOLR
1.0000 g | Freq: Two times a day (BID) | INTRAMUSCULAR | Status: DC
Start: 2015-05-04 — End: 2015-05-04

## 2015-05-04 NOTE — Assessment & Plan Note (Signed)
Will get central pic to save his peripheral veins for potential HD.

## 2015-05-04 NOTE — Assessment & Plan Note (Addendum)
He would like to start IV therapy to attempt limb salvage.  We discussed potential outcomes (cure vs keeping chronic infection at [facility]).  Will start him on ceftaz 1g q24.  Will start on cubicin 724m q24h.  Will get central pic Will see him back in 1 month Will check CBC, CMP, CK, esr, crp Will see vascular tomorrow.

## 2015-05-04 NOTE — Progress Notes (Signed)
   Subjective:    Patient ID: David [name], male    DOB: 1940/12/11, 74 y.o.   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  He was hospitalized in June 2016 and had wound Cx which grew pseudomonas.  He had cx of this wound on 10-21 that grew CNS, and diphtheroids x 2.  He underwent R great toe partial amputation on 10-27. His bone bx confirmed osteo, there is no Cx from this in EPIC or in the records that were sent with him..  Per he and his wife he has had incomplete wound healing and had "yellow, gunky" stuff from his wound.  No fever/chills. No LE erythema.  Has been on anbx (levaquin) for ~ 1 month.  He was seen in ID on 11-21 and had ABI (mod-severe bilateral), he was reffered to vascular surgery, he was continued on his po anbx.  On 12-2 he underwent CT of his R foot: Erosion of the plantar cortex of the stump of the proximal phalangeal bone of the great toe which is of concern for osteomyelitis. Today feels like his foot is better- wound is filling in per his POD. No fevers or chills.  He has had some d/c from his wound-  This has decreased from previous.   Review of Systems  Constitutional: Negative for fever and chills.   Please see HPI. 12 point ROS o/w (-)     Objective:   Physical Exam  Constitutional: He appears well-developed and well-nourished.  Musculoskeletal:       Feet:          Assessment & Plan:

## 2015-05-05 ENCOUNTER — Telehealth: Payer: Self-pay | Admitting: *Deleted

## 2015-05-05 ENCOUNTER — Encounter: Payer: Self-pay | Admitting: Vascular Surgery

## 2015-05-05 ENCOUNTER — Other Ambulatory Visit: Payer: Self-pay | Admitting: *Deleted

## 2015-05-05 ENCOUNTER — Ambulatory Visit (INDEPENDENT_AMBULATORY_CARE_PROVIDER_SITE_OTHER): Payer: Medicare Other | Admitting: Vascular Surgery

## 2015-05-05 VITALS — BP 106/57 | HR 64 | Temp 97.6°F | Resp 18 | Ht 75.5 in | Wt 281.4 lb

## 2015-05-05 DIAGNOSIS — L03031 Cellulitis of right toe: Secondary | ICD-10-CM | POA: Diagnosis not present

## 2015-05-05 DIAGNOSIS — I2510 Atherosclerotic heart disease of native coronary artery without angina pectoris: Secondary | ICD-10-CM

## 2015-05-05 DIAGNOSIS — M86671 Other chronic osteomyelitis, right ankle and foot: Secondary | ICD-10-CM

## 2015-05-05 NOTE — Progress Notes (Signed)
[facility] [place]  Patient name: David [name] MRN: [id number] DOB: 1940/10/30 Sex: male  REASON FOR CONSULT: Poorly healing right great toe amputation  HPI: David [name] is a 74 y.o. male, who is a very pleasant gentleman seen today for evaluation of poorly healing right great toe amputation. He has 25 year history of diabetes of which 20 have been on insulin. He has had the amputations of his second and third toe in the past. He underwent a partial distal toe amputation for osteomyelitis tip of his great toe in October 2016. He has had slow healing of this despite several courses of IV antibody. He is seen today for evaluation to assure that he has adequate arterial flow. He did undergo noninvasive studies and November 2016 which showed non-compressible calcified vessels with the abnormal waveforms. He does not have any claudication type symptoms and does not have any rest pain. Does have chronic swelling suggestive of chronic venous hypertension is well as a significant cardiac history as outlined in the past medical history  Past Medical History  Diagnosis Date  . Hypertension   . Arthritis   . Anxiety   . Sinus node dysfunction (HCC)   . Cardiac pacemaker in situ   . CHF (congestive heart failure) ([place])   . CAD (coronary artery disease)     Nonobstructive CAD per cath  . Dyspnea   . Insulin dependent type 2 diabetes mellitus ([place]) 1991    followd by [name]--  has insulin pump  . Insulin pump in place   . Anemia, iron deficiency   . Secondary hyperparathyroidism of renal origin ([place])   . 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 History of skin cancer   . Chronic ulcer of right foot ([place])   . History of cellulitis     right great toe 10-25-2014  . BPH (benign prostatic hypertrophy)   . Peripheral neuropathy (HCC)     severe  . Peripheral vascular disease ([place])     bilateral lower  extremities  . OSA on CPAP     Family History  Problem Relation Age of Onset  . Cancer Mother     breast  . Heart attack Father     SOCIAL HISTORY: Social History   Social History  . Marital Status: Married    Spouse Name: N/A  . Number of Children: 3  . Years of Education: 12   Occupational History  . Retired    Social History Main Topics  . Smoking status: Former Smoker -- 2.00 packs/day for 30 years    Quit date: 03/03/1984  . Smokeless tobacco: Never Used  . Alcohol Use: 0.6 oz/week    1 Glasses of wine per week     Comment: social  . Drug Use: No  . Sexual Activity: Not on file   Other Topics Concern  . Not on file   Social History Narrative   Currently resides with his wife. 1 dog. Fun: Golf   Denies any religious beliefs effecting health care.     No Known Allergies  Current Outpatient Prescriptions  Medication Sig Dispense Refill  . acetaminophen (TYLENOL) 500 MG tablet Take 1,000 mg by mouth every 6 (six) hours as needed for fever.    Marland Kitchen aspirin EC 325 MG tablet Take 1 tablet (325 mg total) by mouth daily. 42 tablet 0  . calcitRIOL (ROCALTROL) 0.25 MCG capsule  Take 0.25 mcg by mouth daily.     . carvedilol (COREG) 25 MG tablet Take 1 tablet (25 mg total) by mouth 2 (two) times daily with a meal. 90 tablet 1  . cefTAZidime (FORTAZ) 1 G injection IVPB 1 each   . DAPTOmycin 746 mg in sodium chloride 0.9 % 100 mL Inject 746 mg into the vein daily.    Marland Kitchen doxazosin (CARDURA) 4 MG tablet Take 1 tablet (4 mg total) by mouth daily. 90 tablet 1  . insulin aspart (NOVOLOG) 100 UNIT/ML injection Use max 120 units per day with insulin pump. 40 mL 5  . isosorbide mononitrate (IMDUR) 30 MG 24 hr tablet TAKE TWO TABLETS BY MOUTH ONCE DAILY 60 tablet 0  . LORazepam (ATIVAN) 1 MG tablet     . neomycin-polymyxin-hydrocortisone (CORTISPORIN) otic solution Place 3 drops into the left ear 4 (four) times daily. 10 mL 0  . Omega-3 Fatty Acids (FISH OIL) 1000 MG CAPS Take by  mouth 2 (two) times daily.    . pantoprazole (PROTONIX) 40 MG tablet Take 1 tablet (40 mg total) by mouth daily. 90 tablet 1  . SANTYL ointment Apply 1 application topically.  3  . sertraline (ZOLOFT) 50 MG tablet Take 1 tablet (50 mg) daily 90 tablet 0  . torsemide (DEMADEX) 20 MG tablet Take 2 tablets (40 mg total) by mouth daily. 180 tablet 1  . lisinopril (PRINIVIL,ZESTRIL) 10 MG tablet Take 1 tablet (10 mg total) by mouth daily. (Patient not taking: Reported on 05/04/2015) 90 tablet 0   No current facility-administered medications for this visit.    REVIEW OF SYSTEMS:  [X]  denotes positive finding, [ ]  denotes negative finding Cardiac  Comments:  Chest pain or chest pressure:    Shortness of breath upon exertion: x   Short of breath when lying flat:    Irregular heart rhythm:        Vascular    Pain in calf, thigh, or hip brought on by ambulation: x   Pain in feet at night that wakes you up from your sleep:     Blood clot in your veins:    Leg swelling:  x       Pulmonary    Oxygen at home:    Productive cough:     Wheezing:         Neurologic    Sudden weakness in arms or legs:     Sudden numbness in arms or legs:     Sudden onset of difficulty speaking or slurred speech:    Temporary loss of vision in one eye:     Problems with dizziness:  x       Gastrointestinal    Blood in stool:     Vomited blood:         Genitourinary    Burning when urinating:     Blood in urine:        Psychiatric    Major depression:         Hematologic    Bleeding problems:    Problems with blood clotting too easily:        Skin    Rashes or ulcers:        Constitutional    Fever or chills:      PHYSICAL EXAM: Filed Vitals:   05/05/15 1224  BP: 106/57  Pulse: 64  Temp: 97.6 F (36.4 C)  TempSrc: Oral  Resp: 18  Height: 6' 3.5" (1.918 m)  Weight: 281  lb 6.4 oz (127.642 kg)  SpO2: 100%    GENERAL: The patient is a well-nourished male, in no acute distress. The  vital signs are documented above. CARDIAC: There is a regular rate and rhythm.  VASCULAR: Palpable radial pulses bilaterally. 2+ popliteal pulses bilaterally. Absent pedal pulses bilaterally with marked pitting edema bilaterally PULMONARY: There is good air exchange bilaterally without wheezing or rales. ABDOMEN: Soft and non-tender with normal pitched bowel sounds.  MUSCULOSKELETAL: There are no major deformities or cyanosis. NEUROLOGIC: No focal weakness or paresthesias are detected. SKIN: Open ulceration on the tip of his right great toe. This does have some fibrinous exudative no evidence of invasive infection. He has completely healed second and third toe amputations PSYCHIATRIC: The patient has a normal affect.  DATA:  CT scan from 12/2 to suggested changes consistent with his prior partial amputation with no obvious osteomyelitis  MEDICAL ISSUES: Had long discussion with the patient and his son present. With a normal popliteal pulses feel he should have adequate flow to heal his toe. I did listen to his pedal flow by hand-held Doppler he has excellent biphasic flow at the posterior tibial level bilaterally he has a nearly biphasic flow at the anterior tibial and also good flow in the peroneal level. He does have good flow on the base of his medial great toe on the right. I do not see any evidence for a correctable arterial insufficiency and suspect saw small vessel disease in his foot. I feel that this has an excellent chance of healing with ongoing local wound care. He will see [name] again on an as-needed basis   Sherronda Sweigert [facility] [employer]: [phone]

## 2015-05-05 NOTE — Telephone Encounter (Signed)
Patient set up with [facility] pharmacy and nursing.  Orders faxed to them.  They will contact patient 12/20. Patient to get tunneled PICC 12/21 at [facility], arriving at 8:00.  He will need the first dose antibiotics in short stay after PICC placement.  Orders placed for IR (first dose request noted) and antibiotic orders faxed for short stay.   RN relayed information to patient's wife.  Per wife, patient is receiving nursing care from [facility] twice weekly for wound care.  [facility] will assume wound care.  Alvis Lemmings will discharge patient effective today. Landis Gandy, RN

## 2015-05-06 ENCOUNTER — Ambulatory Visit (HOSPITAL_COMMUNITY)
Admission: RE | Admit: 2015-05-06 | Discharge: 2015-05-06 | Disposition: A | Discharge status: Home / Self Care | Payer: Medicare Other | Source: Ambulatory Visit | Attending: Infectious Diseases | Admitting: Infectious Diseases

## 2015-05-06 ENCOUNTER — Other Ambulatory Visit (HOSPITAL_COMMUNITY): Payer: Self-pay | Admitting: *Deleted

## 2015-05-06 ENCOUNTER — Encounter (HOSPITAL_COMMUNITY)
Admission: RE | Admit: 2015-05-06 | Discharge: 2015-05-06 | Disposition: A | Discharge status: Home / Self Care | Payer: Medicare Other | Source: Ambulatory Visit | Attending: Infectious Diseases | Admitting: Infectious Diseases

## 2015-05-06 ENCOUNTER — Other Ambulatory Visit: Payer: Self-pay | Admitting: Infectious Diseases

## 2015-05-06 DIAGNOSIS — N189 Chronic kidney disease, unspecified: Secondary | ICD-10-CM | POA: Insufficient documentation

## 2015-05-06 DIAGNOSIS — M86671 Other chronic osteomyelitis, right ankle and foot: Secondary | ICD-10-CM

## 2015-05-06 DIAGNOSIS — M86171 Other acute osteomyelitis, right ankle and foot: Secondary | ICD-10-CM | POA: Insufficient documentation

## 2015-05-06 DIAGNOSIS — M869 Osteomyelitis, unspecified: Secondary | ICD-10-CM | POA: Insufficient documentation

## 2015-05-06 DIAGNOSIS — Z452 Encounter for adjustment and management of vascular access device: Secondary | ICD-10-CM | POA: Diagnosis not present

## 2015-05-06 MED ORDER — DEXTROSE 5 % IV SOLN
1.0000 g | INTRAVENOUS | Status: AC
  Administered 2015-05-06: 1 g via INTRAVENOUS
  Filled 2015-05-06: qty 1

## 2015-05-06 MED ORDER — LIDOCAINE HCL 1 % IJ SOLN
INTRAMUSCULAR | Status: AC
  Filled 2015-05-06: qty 20

## 2015-05-06 MED ORDER — HEPARIN SOD (PORK) LOCK FLUSH 100 UNIT/ML IV SOLN
INTRAVENOUS | Status: AC
  Administered 2015-05-06: 250 [IU]
  Filled 2015-05-06: qty 5

## 2015-05-06 MED ORDER — SODIUM CHLORIDE 0.9 % IV SOLN
746.0000 mg | INTRAVENOUS | Status: DC
  Filled 2015-05-06: qty 14.92

## 2015-05-06 MED ORDER — SODIUM CHLORIDE 0.9 % IV SOLN
746.0000 mg | Freq: Once | INTRAVENOUS | Status: AC
  Administered 2015-05-06: 746 mg via INTRAVENOUS
  Filled 2015-05-06: qty 14.92

## 2015-05-06 MED ORDER — DEXTROSE 5 % IV SOLN
1.0000 g | Freq: Once | INTRAVENOUS | Status: DC
  Filled 2015-05-06: qty 1

## 2015-05-06 NOTE — Procedures (Signed)
Tunnelled R IJ CVC placed No complication No blood loss. See complete dictation in [facility].

## 2015-05-07 DIAGNOSIS — M869 Osteomyelitis, unspecified: Secondary | ICD-10-CM | POA: Diagnosis not present

## 2015-05-07 DIAGNOSIS — Z7982 Long term (current) use of aspirin: Secondary | ICD-10-CM | POA: Diagnosis not present

## 2015-05-07 DIAGNOSIS — Z9181 History of falling: Secondary | ICD-10-CM | POA: Diagnosis not present

## 2015-05-07 DIAGNOSIS — Z4781 Encounter for orthopedic aftercare following surgical amputation: Secondary | ICD-10-CM | POA: Diagnosis not present

## 2015-05-07 DIAGNOSIS — Z452 Encounter for adjustment and management of vascular access device: Secondary | ICD-10-CM | POA: Diagnosis not present

## 2015-05-07 DIAGNOSIS — I10 Essential (primary) hypertension: Secondary | ICD-10-CM | POA: Diagnosis not present

## 2015-05-07 DIAGNOSIS — Z792 Long term (current) use of antibiotics: Secondary | ICD-10-CM | POA: Diagnosis not present

## 2015-05-07 DIAGNOSIS — I2510 Atherosclerotic heart disease of native coronary artery without angina pectoris: Secondary | ICD-10-CM | POA: Diagnosis not present

## 2015-05-07 DIAGNOSIS — Z89411 Acquired absence of right great toe: Secondary | ICD-10-CM | POA: Diagnosis not present

## 2015-05-07 DIAGNOSIS — E1169 Type 2 diabetes mellitus with other specified complication: Secondary | ICD-10-CM | POA: Diagnosis not present

## 2015-05-07 DIAGNOSIS — Z794 Long term (current) use of insulin: Secondary | ICD-10-CM | POA: Diagnosis not present

## 2015-05-07 DIAGNOSIS — G4733 Obstructive sleep apnea (adult) (pediatric): Secondary | ICD-10-CM | POA: Diagnosis not present

## 2015-05-07 DIAGNOSIS — E7800 Pure hypercholesterolemia, unspecified: Secondary | ICD-10-CM | POA: Diagnosis not present

## 2015-05-09 DIAGNOSIS — E1169 Type 2 diabetes mellitus with other specified complication: Secondary | ICD-10-CM | POA: Diagnosis not present

## 2015-05-09 DIAGNOSIS — Z4781 Encounter for orthopedic aftercare following surgical amputation: Secondary | ICD-10-CM | POA: Diagnosis not present

## 2015-05-09 DIAGNOSIS — I2510 Atherosclerotic heart disease of native coronary artery without angina pectoris: Secondary | ICD-10-CM | POA: Diagnosis not present

## 2015-05-09 DIAGNOSIS — M869 Osteomyelitis, unspecified: Secondary | ICD-10-CM | POA: Diagnosis not present

## 2015-05-09 DIAGNOSIS — I10 Essential (primary) hypertension: Secondary | ICD-10-CM | POA: Diagnosis not present

## 2015-05-11 DIAGNOSIS — M869 Osteomyelitis, unspecified: Secondary | ICD-10-CM | POA: Diagnosis not present

## 2015-05-11 DIAGNOSIS — M86071 Acute hematogenous osteomyelitis, right ankle and foot: Secondary | ICD-10-CM | POA: Diagnosis not present

## 2015-05-11 DIAGNOSIS — E1169 Type 2 diabetes mellitus with other specified complication: Secondary | ICD-10-CM | POA: Diagnosis not present

## 2015-05-11 DIAGNOSIS — I10 Essential (primary) hypertension: Secondary | ICD-10-CM | POA: Diagnosis not present

## 2015-05-11 DIAGNOSIS — Z4781 Encounter for orthopedic aftercare following surgical amputation: Secondary | ICD-10-CM | POA: Diagnosis not present

## 2015-05-11 DIAGNOSIS — I2510 Atherosclerotic heart disease of native coronary artery without angina pectoris: Secondary | ICD-10-CM | POA: Diagnosis not present

## 2015-05-12 DIAGNOSIS — E7800 Pure hypercholesterolemia, unspecified: Secondary | ICD-10-CM | POA: Diagnosis not present

## 2015-05-12 DIAGNOSIS — E1169 Type 2 diabetes mellitus with other specified complication: Secondary | ICD-10-CM | POA: Diagnosis not present

## 2015-05-12 DIAGNOSIS — L602 Onychogryphosis: Secondary | ICD-10-CM | POA: Diagnosis not present

## 2015-05-12 DIAGNOSIS — Z794 Long term (current) use of insulin: Secondary | ICD-10-CM | POA: Diagnosis not present

## 2015-05-12 DIAGNOSIS — Z792 Long term (current) use of antibiotics: Secondary | ICD-10-CM | POA: Diagnosis not present

## 2015-05-12 DIAGNOSIS — I2510 Atherosclerotic heart disease of native coronary artery without angina pectoris: Secondary | ICD-10-CM | POA: Diagnosis not present

## 2015-05-12 DIAGNOSIS — E1151 Type 2 diabetes mellitus with diabetic peripheral angiopathy without gangrene: Secondary | ICD-10-CM | POA: Diagnosis not present

## 2015-05-12 DIAGNOSIS — Z4781 Encounter for orthopedic aftercare following surgical amputation: Secondary | ICD-10-CM | POA: Diagnosis not present

## 2015-05-12 DIAGNOSIS — I10 Essential (primary) hypertension: Secondary | ICD-10-CM | POA: Diagnosis not present

## 2015-05-12 DIAGNOSIS — I70293 Other atherosclerosis of native arteries of extremities, bilateral legs: Secondary | ICD-10-CM | POA: Diagnosis not present

## 2015-05-12 DIAGNOSIS — L97512 Non-pressure chronic ulcer of other part of right foot with fat layer exposed: Secondary | ICD-10-CM | POA: Diagnosis not present

## 2015-05-12 DIAGNOSIS — M869 Osteomyelitis, unspecified: Secondary | ICD-10-CM | POA: Diagnosis not present

## 2015-05-14 DIAGNOSIS — I10 Essential (primary) hypertension: Secondary | ICD-10-CM | POA: Diagnosis not present

## 2015-05-14 DIAGNOSIS — Z4781 Encounter for orthopedic aftercare following surgical amputation: Secondary | ICD-10-CM | POA: Diagnosis not present

## 2015-05-14 DIAGNOSIS — M869 Osteomyelitis, unspecified: Secondary | ICD-10-CM | POA: Diagnosis not present

## 2015-05-14 DIAGNOSIS — E1169 Type 2 diabetes mellitus with other specified complication: Secondary | ICD-10-CM | POA: Diagnosis not present

## 2015-05-14 DIAGNOSIS — I2510 Atherosclerotic heart disease of native coronary artery without angina pectoris: Secondary | ICD-10-CM | POA: Diagnosis not present

## 2015-05-15 ENCOUNTER — Other Ambulatory Visit (INDEPENDENT_AMBULATORY_CARE_PROVIDER_SITE_OTHER): Payer: Medicare Other

## 2015-05-15 DIAGNOSIS — E1129 Type 2 diabetes mellitus with other diabetic kidney complication: Secondary | ICD-10-CM

## 2015-05-15 DIAGNOSIS — E782 Mixed hyperlipidemia: Secondary | ICD-10-CM

## 2015-05-15 DIAGNOSIS — IMO0002 Reserved for concepts with insufficient information to code with codable children: Secondary | ICD-10-CM

## 2015-05-15 DIAGNOSIS — I10 Essential (primary) hypertension: Secondary | ICD-10-CM

## 2015-05-15 DIAGNOSIS — E1165 Type 2 diabetes mellitus with hyperglycemia: Secondary | ICD-10-CM

## 2015-05-15 LAB — MICROALBUMIN / CREATININE URINE RATIO
Creatinine,U: 38.5 mg/dL
Microalb Creat Ratio: 23.9 mg/g (ref 0.0–30.0)
Microalb, Ur: 9.2 mg/dL — ABNORMAL HIGH (ref 0.0–1.9)

## 2015-05-15 LAB — COMPREHENSIVE METABOLIC PANEL
ALT: 15 U/L (ref 0–53)
AST: 20 U/L (ref 0–37)
Albumin: 4 g/dL (ref 3.5–5.2)
Alkaline Phosphatase: 72 U/L (ref 39–117)
BUN: 49 mg/dL — ABNORMAL HIGH (ref 6–23)
CO2: 27 mEq/L (ref 19–32)
Calcium: 9.8 mg/dL (ref 8.4–10.5)
Chloride: 104 mEq/L (ref 96–112)
Creatinine, Ser: 1.78 mg/dL — ABNORMAL HIGH (ref 0.40–1.50)
GFR: 39.83 mL/min — ABNORMAL LOW (ref >60.00)
Glucose, Bld: 100 mg/dL — ABNORMAL HIGH (ref 70–99)
Potassium: 4.5 mEq/L (ref 3.5–5.1)
Sodium: 139 mEq/L (ref 135–145)
Total Bilirubin: 0.5 mg/dL (ref 0.2–1.2)
Total Protein: 7.1 g/dL (ref 6.0–8.3)

## 2015-05-15 LAB — LDL CHOLESTEROL, DIRECT: Direct LDL: 69 mg/dL

## 2015-05-15 LAB — LIPID PANEL
Cholesterol: 183 mg/dL (ref 0–200)
HDL: 22.7 mg/dL — ABNORMAL LOW (ref >39.00)
NonHDL: 159.87
Total CHOL/HDL Ratio: 8
Triglycerides: 336 mg/dL — ABNORMAL HIGH (ref 0.0–149.0)
VLDL: 67.2 mg/dL — ABNORMAL HIGH (ref 0.0–40.0)

## 2015-05-15 LAB — HEMOGLOBIN A1C: Hgb A1c MFr Bld: 8.8 % — ABNORMAL HIGH (ref 4.6–6.5)

## 2015-05-18 ENCOUNTER — Other Ambulatory Visit: Payer: Self-pay | Admitting: Internal Medicine

## 2015-05-18 DIAGNOSIS — I2510 Atherosclerotic heart disease of native coronary artery without angina pectoris: Secondary | ICD-10-CM | POA: Diagnosis not present

## 2015-05-18 DIAGNOSIS — E1169 Type 2 diabetes mellitus with other specified complication: Secondary | ICD-10-CM | POA: Diagnosis not present

## 2015-05-18 DIAGNOSIS — Z4781 Encounter for orthopedic aftercare following surgical amputation: Secondary | ICD-10-CM | POA: Diagnosis not present

## 2015-05-18 DIAGNOSIS — I10 Essential (primary) hypertension: Secondary | ICD-10-CM | POA: Diagnosis not present

## 2015-05-18 DIAGNOSIS — M86279 Subacute osteomyelitis, unspecified ankle and foot: Secondary | ICD-10-CM | POA: Diagnosis not present

## 2015-05-18 DIAGNOSIS — M869 Osteomyelitis, unspecified: Secondary | ICD-10-CM | POA: Diagnosis not present

## 2015-05-20 ENCOUNTER — Ambulatory Visit (INDEPENDENT_AMBULATORY_CARE_PROVIDER_SITE_OTHER): Payer: Medicare Other | Admitting: Endocrinology

## 2015-05-20 ENCOUNTER — Encounter: Payer: Self-pay | Admitting: Endocrinology

## 2015-05-20 VITALS — BP 130/70 | HR 64 | Temp 97.7°F | Resp 14 | Ht 75.5 in | Wt 274.4 lb

## 2015-05-20 DIAGNOSIS — E782 Mixed hyperlipidemia: Secondary | ICD-10-CM

## 2015-05-20 DIAGNOSIS — Z794 Long term (current) use of insulin: Secondary | ICD-10-CM

## 2015-05-20 DIAGNOSIS — E1165 Type 2 diabetes mellitus with hyperglycemia: Secondary | ICD-10-CM | POA: Diagnosis not present

## 2015-05-20 NOTE — Patient Instructions (Addendum)
Victoza 1.8mg  daily, leave used pen out on desk  Check blood sugars on waking up  4  times a week Also check blood sugars about 2 hours after a meal and do this after different meals by rotation  Recommended blood sugar levels on waking up is 90-130 and about 2 hours after meal is 130-160  Please bring your blood sugar monitor to each visit, thank you  Basal rates: midnight: 2.9, 6 AM = 3.55,  1 pm=3.8,  10 PM = 3.0

## 2015-05-20 NOTE — Progress Notes (Signed)
Patient ID: David [name], male   DOB: 06/19/1940, 75 y.o.   MRN: [id number]    Reason for Appointment: Followup for Type 2 Diabetes  Referring physician: Megan Salon  History of Present Illness:          Diagnosis: Type 2 diabetes mellitus, date of diagnosis:   1992       Past history:  He was initially treated with metformin and at some point also glipizide. His previous records from out of town are not available and no information is available about his level of control Apparently he was started on insulin in 1994 approximately because of poor control He has been on various insulin regimens over the last several years However even with insulin he has had poor control for at least the last 7 or 8 years. He does not know what his previous A1c levels have been. He had been continued on metformin and glipizide but metformin stopped about 2 years ago because of kidney function abnormality He had been taking Lantus 60 units twice a day with NovoLog previously and also Before his initial consultation in 6/15 he was on NovoLog twice a day and Humalog mix insulin  Because of poor control and large insulin doses he was started on Victoza in 6/15  Recent history:   INSULIN PUMP  regimen is as follows   Basal rates: midnight: 2.8, 5 AM = 3.55, 9:30 PM = 3.0 Carbohydrate coverage 1:7 for lunch otherwise 1:5, correction 1:10 with target 120-150  Average total daily insulin 86 +/-4 units, basal insulin is 91%  Since 04/28/14 he has been on a Medtronic insulin pump because of persistent poor control and high insulin requirement Initially with starting the pump his blood sugars had been significantly better but subsequently blood sugars have been again high mostly related to noncompliance He was told to change his basal rates on the last visit but he apparently did not do so even though he thinks he knows how to program his basal rates Also he was told to take his Victoza consistently  but he does not do so and cannot explain why His last A1c was 8.7% and now 8.8  Current problems and blood sugar patterns:  Checking blood sugars somewhat more frequently but mostly only before meals  He has significant variability in his blood sugars especially fasting which are recently better  Blood sugars are usually better midday  Blood sugars are variable around suppertime but more consistently high  He does not usually do any readings later at night after supper  He says that he is taking his Victoza only 2 or 3 times a week as he forgets, will take 1.8 mg when he does without side effects       Oral hypoglycemic drugs the patient is taking are: None, has renal dysfunction        Glucose monitoring:  done about 2-3 times a day         Glucometer:  contour     Blood Glucose readings from pump download:  Mean values apply above for all meters except median for One Touch  PRE-MEAL Fasting Lunch Dinner Bedtime Overall  Glucose range: 141-309 104-394 136-315    Mean/median: 240  179  198    Hypoglycemia: None   Glycemic control:   Lab Results  Component Value Date   HGBA1C 8.8* 05/15/2015   HGBA1C 8.7* 12/15/2014   HGBA1C 11.5* 10/26/2014   Lab Results  Component Value Date  MICROALBUR 9.2* 05/15/2015   LDLCALC 49 10/29/2013   CREATININE 1.78* 05/15/2015    Self-care: The diet that the patient has been following is: tries to limit fat intake   Meals: 2- 3 meals per day.breakfast is either cereal or eggs and toast, lunch is usually a sandwich , dinner at  6-7 pm.   He thinks his portions are small but periodically eats sweets          Exercise:  not doing exercise bike, previously  was doing 15-30 min        Dietician visit: Most recent: several years ago.               Compliance with the medical regimen: good Retinal exam: Most recent: 05/2013.    Weight history:  Wt Readings from Last 3 Encounters:  05/20/15 274 lb 6.4 oz (124.467 kg)  05/06/15 284 lb  (128.822 kg)  05/05/15 281 lb 6.4 oz (127.642 kg)      Medication List       This list is accurate as of: 05/20/15  2:50 PM.  Always use your most recent med list.               acetaminophen 500 MG tablet  Commonly known as:  TYLENOL  Take 1,000 mg by mouth every 6 (six) hours as needed for fever.     aspirin EC 325 MG tablet  Take 1 tablet (325 mg total) by mouth daily.     calcitRIOL 0.25 MCG capsule  Commonly known as:  ROCALTROL  Take 0.25 mcg by mouth daily.     carvedilol 25 MG tablet  Commonly known as:  COREG  Take 1 tablet (25 mg total) by mouth 2 (two) times daily with a meal.     cefTAZidime 1 g injection  Commonly known as:  FORTAZ  IVPB     cefTAZidime 2 g injection  Commonly known as:  FORTAZ     CUBICIN 500 MG injection  Generic drug:  DAPTOmycin     DAPTOmycin 746 mg in sodium chloride 0.9 % 100 mL  Inject 746 mg into the vein daily.     doxazosin 4 MG tablet  Commonly known as:  CARDURA  Take 1 tablet (4 mg total) by mouth daily.     Fish Oil 1000 MG Caps  Take by mouth 2 (two) times daily.     insulin aspart 100 UNIT/ML injection  Commonly known as:  NOVOLOG  Use max 120 units per day with insulin pump.     isosorbide mononitrate 30 MG 24 hr tablet  Commonly known as:  IMDUR  TAKE TWO TABLETS BY MOUTH ONCE DAILY     lisinopril 10 MG tablet  Commonly known as:  PRINIVIL,ZESTRIL  Take 1 tablet (10 mg total) by mouth daily.     LORazepam 1 MG tablet  Commonly known as:  ATIVAN     neomycin-polymyxin-hydrocortisone otic solution  Commonly known as:  CORTISPORIN  Place 3 drops into the left ear 4 (four) times daily.     pantoprazole 40 MG tablet  Commonly known as:  PROTONIX  Take 1 tablet (40 mg total) by mouth daily.     SANTYL ointment  Generic drug:  collagenase  Apply 1 application topically.     sertraline 50 MG tablet  Commonly known as:  ZOLOFT  Take 1 tablet (50 mg) daily     sertraline 50 MG tablet  Commonly known  as:  ZOLOFT  TAKE  ONE-HALF TABLET BY MOUTH ONCE DAILY     torsemide 20 MG tablet  Commonly known as:  DEMADEX  Take 2 tablets (40 mg total) by mouth daily.        Allergies: No Known Allergies  Past Medical History  Diagnosis Date  . Hypertension   . Arthritis   . Anxiety   . Sinus node dysfunction (HCC)   . Cardiac pacemaker in situ   . CHF (congestive heart failure) ([place])   . CAD (coronary artery disease)     Nonobstructive CAD per cath  . Dyspnea   . Insulin dependent type 2 diabetes mellitus ([place]) 1991    followd by [name]--  has insulin pump  . Insulin pump in place   . Anemia, iron deficiency   . Secondary hyperparathyroidism of renal origin ([place])   . 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 History of skin cancer   . Chronic ulcer of right foot ([place])   . History of cellulitis     right great toe 10-25-2014  . BPH (benign prostatic hypertrophy)   . Peripheral neuropathy (HCC)     severe  . Peripheral vascular disease ([place])     bilateral lower extremities  . OSA on CPAP     Past Surgical History  Procedure Laterality Date  . Vein ligation and stripping    . 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 Orif ankle fracture Left 11/06/2014    Procedure: OPEN REDUCTION INTERNAL FIXATION (ORIF) LEFT  ANKLE FRACTURE;  Surgeon: [name], MD;  Location: [facility];  Service: Orthopedics;  Laterality: Left;  . Cardiac catheterization  11-25-2010   [place], [place]    Nonobstructive CAD  . Cardiac pacemaker placement  Nov 2009    Medtronic  . Total knee arthroplasty    . Excision bone cyst Right 03/06/2015    Procedure: BONE BIOPSIES OF RIGHT FOOT;  Surgeon: [name], MD;  Location: [facility];  Service: Podiatry;  Laterality: Right;  . Amputation toe Right 03/12/2015    Procedure: RIGHT HALLUS AMPUTATION ;  Surgeon: [name], MD;   Location: [facility];  Service: Podiatry;  Laterality: Right;    Family History  Problem Relation Age of Onset  . Cancer Mother     breast  . Heart attack Father     Social History:  reports that he quit smoking about 31 years ago. He has never used smokeless tobacco. He reports that he drinks about 0.6 oz of alcohol per week. He reports that he does not use illicit drugs.    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 Also had taken Gembrozil for several years without apparent side effects.  Both of these were  stopped because of increased liver functions and LFT has been back to normal  LDL has not been high subsequently but HDL is low Currently taking only fish oil and LDL is within target but triglycerides are high Currently lipids are followed by PCP       Lab Results  Component Value Date   CHOL 183 05/15/2015   HDL 22.70* 05/15/2015   LDLCALC 49 10/29/2013   LDLDIRECT 69.0  05/15/2015   TRIG 336.0* 05/15/2015   CHOLHDL 8 05/15/2015      HYPERTENSION: The blood pressure has been treated with various drugs.   Currently taking 4 mg doxazosin, carvedilol, lisinopril    Chronic kidney disease: Etiology unknown, is followed by nephrologist but not recently Creatinine level has been variable Last creatinine:  Lab Results  Component Value Date   CREATININE 1.78* 05/15/2015        Has history of Numbness in his feet  since about 2013, no tingling or burning in feet    Previous foot exam findings:   Vibration sense is absent in toes. Ankle jerks are absent bilaterally.          Diabetic foot exam:  No callus formation. Mild denudation of skin on left second toe superiorly Amputation of right third toe. Thickening and dryness of skin on the toes on the right Absent monofilament sensation in the feet Absent pedal  pulses     Physical Examination:  BP 130/70 mmHg  Pulse 64  Temp(Src) 97.7 F (36.5 C)  Resp 14  Ht 6' 3.5" (1.918 m)  Wt 274 lb 6.4 oz (124.467 kg)  BMI 33.83 kg/m2  SpO2 99%   ASSESSMENT:  Diabetes type 2, uncontrolled with neuropathy. His blood sugars are overall not consistently controlled with A1c recently still over 8% See history of present illness for detailed discussion of his current management, blood sugar patterns and problems identified  He is still insulin resistant and not getting enough insulin He does not understand the need to take Victoza daily and also does not understand that he can leave it outside the refrigerator when starting to use a pen Compliance with diet is regular Not able to exercise currently because of foot ulcer  PLAN:   Pump settings will be changed by the nurse educator as follows: Midnight = 2.9, 6 AM = 3.55, 1 PM = 3.8 and 10 PM = 3.0  Check blood sugars more often after meals  More consistent boluses for all meals and snacks   Take boluses for any reading over 150  Take Victoza consistentlyas discussed today with 1.8 mg  Needs to have short-term follow-up in one month  To call if he has any low sugars  Reviewed instructions with patient and his wife today  Hypertension: Blood pressure is well-controlled  Hyperlipidemia: Triglycerides are high and will consider low-dose fenofibrate on the next visit  Counseling time on subjects discussed above is over 50% of today's 25 minute visit   Patient Instructions  Victoza 1.8mg  daily, leave used pen out on desk  Check blood sugars on waking up  4  times a week Also check blood sugars about 2 hours after a meal and do this after different meals by rotation  Recommended blood sugar levels on waking up is 90-130 and about 2 hours after meal is 130-160  Please bring your blood sugar monitor to each visit, thank you  Basal rates: midnight: 2.9, 6 AM = 3.55,  1 pm=3.8,  10 PM =  3.0    David [name] 05/20/2015, 2:50 PM   Note: This office note was prepared with Estate agent. Any transcriptional errors that result from this process are unintentional.

## 2015-05-21 DIAGNOSIS — E1169 Type 2 diabetes mellitus with other specified complication: Secondary | ICD-10-CM | POA: Diagnosis not present

## 2015-05-21 DIAGNOSIS — I2510 Atherosclerotic heart disease of native coronary artery without angina pectoris: Secondary | ICD-10-CM | POA: Diagnosis not present

## 2015-05-21 DIAGNOSIS — Z4781 Encounter for orthopedic aftercare following surgical amputation: Secondary | ICD-10-CM | POA: Diagnosis not present

## 2015-05-21 DIAGNOSIS — M869 Osteomyelitis, unspecified: Secondary | ICD-10-CM | POA: Diagnosis not present

## 2015-05-21 DIAGNOSIS — I10 Essential (primary) hypertension: Secondary | ICD-10-CM | POA: Diagnosis not present

## 2015-05-25 ENCOUNTER — Telehealth: Payer: Self-pay | Admitting: Cardiology

## 2015-05-25 ENCOUNTER — Ambulatory Visit (INDEPENDENT_AMBULATORY_CARE_PROVIDER_SITE_OTHER): Payer: Medicare Other | Admitting: *Deleted

## 2015-05-25 ENCOUNTER — Encounter: Payer: Self-pay | Admitting: Family

## 2015-05-25 ENCOUNTER — Ambulatory Visit: Payer: Medicare Other | Admitting: Family Medicine

## 2015-05-25 ENCOUNTER — Other Ambulatory Visit: Payer: Self-pay | Admitting: Internal Medicine

## 2015-05-25 DIAGNOSIS — Z950 Presence of cardiac pacemaker: Secondary | ICD-10-CM

## 2015-05-25 MED ORDER — SERTRALINE HCL 50 MG PO TABS
50.0000 mg | ORAL_TABLET | Freq: Every day | ORAL | Status: DC
Start: 2015-05-25 — End: 2016-09-09

## 2015-05-25 NOTE — Telephone Encounter (Signed)
Spoke w/ pt wife and informed her that pt PPM has reached ERI and that scheduler will contact her to schedule an appt w/ MD / PA / NP. Pt wife verbalized understanding.

## 2015-05-25 NOTE — Telephone Encounter (Signed)
Spoke with pt and reminded pt of remote transmission that is due today. Pt verbalized understanding.   

## 2015-05-25 NOTE — Progress Notes (Signed)
Remote pacemaker transmission.   

## 2015-05-26 ENCOUNTER — Ambulatory Visit (INDEPENDENT_AMBULATORY_CARE_PROVIDER_SITE_OTHER): Payer: Medicare Other | Admitting: Internal Medicine

## 2015-05-26 ENCOUNTER — Encounter: Payer: Self-pay | Admitting: Internal Medicine

## 2015-05-26 ENCOUNTER — Other Ambulatory Visit: Payer: Self-pay | Admitting: Internal Medicine

## 2015-05-26 ENCOUNTER — Encounter: Payer: Self-pay | Admitting: *Deleted

## 2015-05-26 VITALS — BP 108/54 | HR 65 | Ht 76.0 in | Wt 274.2 lb

## 2015-05-26 DIAGNOSIS — I495 Sick sinus syndrome: Secondary | ICD-10-CM | POA: Diagnosis not present

## 2015-05-26 DIAGNOSIS — Z950 Presence of cardiac pacemaker: Secondary | ICD-10-CM | POA: Diagnosis not present

## 2015-05-26 DIAGNOSIS — M869 Osteomyelitis, unspecified: Secondary | ICD-10-CM | POA: Diagnosis not present

## 2015-05-26 DIAGNOSIS — I10 Essential (primary) hypertension: Secondary | ICD-10-CM | POA: Diagnosis not present

## 2015-05-26 DIAGNOSIS — I2510 Atherosclerotic heart disease of native coronary artery without angina pectoris: Secondary | ICD-10-CM | POA: Diagnosis not present

## 2015-05-26 DIAGNOSIS — Z4781 Encounter for orthopedic aftercare following surgical amputation: Secondary | ICD-10-CM | POA: Diagnosis not present

## 2015-05-26 DIAGNOSIS — Z01812 Encounter for preprocedural laboratory examination: Secondary | ICD-10-CM | POA: Diagnosis not present

## 2015-05-26 DIAGNOSIS — E1169 Type 2 diabetes mellitus with other specified complication: Secondary | ICD-10-CM | POA: Diagnosis not present

## 2015-05-26 LAB — BASIC METABOLIC PANEL
BUN: 45 mg/dL — ABNORMAL HIGH (ref 7–25)
CO2: 27 mmol/L (ref 20–31)
Calcium: 9.7 mg/dL (ref 8.6–10.3)
Chloride: 102 mmol/L (ref 98–110)
Creat: 2.29 mg/dL — ABNORMAL HIGH (ref 0.70–1.18)
Glucose, Bld: 140 mg/dL — ABNORMAL HIGH (ref 65–99)
Potassium: 5.1 mmol/L (ref 3.5–5.3)
Sodium: 138 mmol/L (ref 135–146)

## 2015-05-26 LAB — CUP PACEART INCLINIC DEVICE CHECK
Battery Impedance: 6364 Ohm
Battery Voltage: 2.64 V
Brady Statistic RV Percent Paced: 12 %
Date Time Interrogation Session: 20170110153830
Implantable Lead Implant Date: 20091102
Implantable Lead Implant Date: 20091102
Implantable Lead Location: 753859
Implantable Lead Location: 753860
Implantable Lead Model: 5076
Implantable Lead Model: 5076
Lead Channel Impedance Value: 567 Ohm
Lead Channel Impedance Value: 67 Ohm
Lead Channel Pacing Threshold Amplitude: 0.75 V
Lead Channel Pacing Threshold Pulse Width: 0.4 ms
Lead Channel Sensing Intrinsic Amplitude: 22.4 mV
Lead Channel Setting Pacing Amplitude: 2.5 V
Lead Channel Setting Pacing Pulse Width: 0.4 ms
Lead Channel Setting Sensing Sensitivity: 5.6 mV

## 2015-05-26 LAB — CBC WITH DIFFERENTIAL/PLATELET
Basophils Absolute: 0 10*3/uL (ref 0.0–0.1)
Basophils Relative: 0 % (ref 0–1)
Eosinophils Absolute: 0.3 10*3/uL (ref 0.0–0.7)
Eosinophils Relative: 3 % (ref 0–5)
HCT: 36.2 % — ABNORMAL LOW (ref 39.0–52.0)
Hemoglobin: 12 g/dL — ABNORMAL LOW (ref 13.0–17.0)
Lymphocytes Relative: 20 % (ref 12–46)
Lymphs Abs: 1.7 10*3/uL (ref 0.7–4.0)
MCH: 29.3 pg (ref 26.0–34.0)
MCHC: 33.1 g/dL (ref 30.0–36.0)
MCV: 88.3 fL (ref 78.0–100.0)
MPV: 10.5 fL (ref 8.6–12.4)
Monocytes Absolute: 0.9 10*3/uL (ref 0.1–1.0)
Monocytes Relative: 10 % (ref 3–12)
Neutro Abs: 5.8 10*3/uL (ref 1.7–7.7)
Neutrophils Relative %: 67 % (ref 43–77)
Platelets: 191 10*3/uL (ref 150–400)
RBC: 4.1 MIL/uL — ABNORMAL LOW (ref 4.22–5.81)
RDW: 17.2 % — ABNORMAL HIGH (ref 11.5–15.5)
WBC: 8.6 10*3/uL (ref 4.0–10.5)

## 2015-05-26 NOTE — Progress Notes (Signed)
Patient Care Team: [name], FNP as PCP - General (Family Medicine) [name], MD as Consulting Physician (Podiatry) [name], MD as Consulting Physician (Nephrology)   HPI  David [name] is a 75 y.o. male Seen in followup for pacemaker implanted for sinus node dysfunction and dyspnea on exertion. Cardiac-wise exhibited stable. He denies chest pain or limiting shortness of breath. There is mild edema.  Echocardiogram 6/15 normal LV function and severe left ventricular hypertrophy with left atrial enlargement consistent with hypertensive heart disease  Evaluation [place] included a Myoview that was false positive catheterizations were undertaken in 2009, then, 12 demonstrating 60% of the RCA, 70% in 240% circumflex and 60% OM1 40% LAD.   He has renal insufficiency grade 3 with a GFR 12/16 of 1.78. Potassium level was 4.5 around that same time; he is followed closely by nephrology   Intercurrently he has suffered from a pneumonia, a fractured leg that occurred in the hospital when he slipped on the floor, and infection in the contralateral foot.  His device has reached ERI and with reversion has developed progressive exercise intolerance.  Past Medical History  Diagnosis Date  . Hypertension   . Arthritis   . Anxiety   . Sinus node dysfunction (HCC)   . Cardiac pacemaker in situ   . CHF (congestive heart failure) ([place])   . CAD (coronary artery disease)     Nonobstructive CAD per cath  . Dyspnea   . Insulin dependent type 2 diabetes mellitus ([place]) 1991    followd by [name]--  has insulin pump  . Insulin pump in place   . Anemia, iron deficiency   . Secondary hyperparathyroidism of renal origin ([place])   . 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 History of skin cancer   . Chronic ulcer of right foot ([place])   . History of cellulitis     right great toe 10-25-2014  . BPH (benign prostatic  hypertrophy)   . Peripheral neuropathy (HCC)     severe  . Peripheral vascular disease ([place])     bilateral lower extremities  . OSA on CPAP     Past Surgical History  Procedure Laterality Date  . Vein ligation and stripping    . 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 Orif ankle fracture Left 11/06/2014    Procedure: OPEN REDUCTION INTERNAL FIXATION (ORIF) LEFT  ANKLE FRACTURE;  Surgeon: [name], MD;  Location: [facility];  Service: Orthopedics;  Laterality: Left;  . Cardiac catheterization  11-25-2010   [place], [place]    Nonobstructive CAD  . Cardiac pacemaker placement  Nov 2009    Medtronic  . Total knee arthroplasty    . Excision bone cyst Right 03/06/2015    Procedure: BONE BIOPSIES OF RIGHT FOOT;  Surgeon: [name], MD;  Location: [facility];  Service: Podiatry;  Laterality: Right;  . Amputation toe Right 03/12/2015    Procedure: RIGHT HALLUS AMPUTATION ;  Surgeon: [name], MD;  Location: [facility];  Service: Podiatry;  Laterality: Right;    Current Outpatient Prescriptions  Medication Sig Dispense Refill  . acetaminophen (TYLENOL) 500 MG tablet Take 1,000 mg by mouth every 6 (six) hours as needed for fever.    Marland Kitchen aspirin EC 325 MG tablet Take 1 tablet (325 mg total) by mouth daily.  42 tablet 0  . calcitRIOL (ROCALTROL) 0.25 MCG capsule Take 0.25 mcg by mouth daily.     . carvedilol (COREG) 25 MG tablet Take 1 tablet (25 mg total) by mouth 2 (two) times daily with a meal. 90 tablet 1  . CUBICIN 500 MG injection     . DAPTOmycin 746 mg in sodium chloride 0.9 % 100 mL Inject 746 mg into the vein daily.    Marland Kitchen doxazosin (CARDURA) 4 MG tablet Take 1 tablet (4 mg total) by mouth daily. 90 tablet 1  . insulin aspart (NOVOLOG) 100 UNIT/ML injection Use max 120 units per day with insulin pump. 40 mL 5  . isosorbide mononitrate (IMDUR) 30 MG 24 hr tablet TAKE TWO TABLETS BY MOUTH ONCE DAILY  60 tablet 0  . neomycin-polymyxin-hydrocortisone (CORTISPORIN) otic solution Place 3 drops into the left ear 4 (four) times daily. 10 mL 0  . Omega-3 Fatty Acids (FISH OIL) 1000 MG CAPS Take by mouth 2 (two) times daily.    . pantoprazole (PROTONIX) 40 MG tablet Take 1 tablet (40 mg total) by mouth daily. 90 tablet 1  . SANTYL ointment Apply 1 application topically.  3  . sertraline (ZOLOFT) 50 MG tablet Take 1 tablet (50 mg total) by mouth daily. 30 tablet 11  . torsemide (DEMADEX) 20 MG tablet Take 2 tablets (40 mg total) by mouth daily. 180 tablet 1   No current facility-administered medications for this visit.    No Known Allergies  Review of Systems negative except from HPI and PMH  Physical Exam BP 108/54 mmHg  Pulse 65  Ht 6\' 4"  (1.93 m)  Wt 274 lb 3.2 oz (124.376 kg)  BMI 33.39 kg/m2 Well developed and well nourished in no acute distress HENT normal E scleral and icterus clear Neck Supple JVP flat; carotids brisk and full Clear to ausculation  Regular rate and rhythm, no murmurs gallops or rub Soft with active bowel sounds No clubbing cyanosis 1+ Edema  Wound infection  Alert and oriented, grossly normal motor and sensory function Skin Warm and Dry  ECG demonstrates atrial pacing at 67@intervals  24/12/44 Poor R-wave progression and inferolateral T wave is  Assessment and  Plan Sinus node dysfunction Pacemaker  Medtronic  Renal dysfunction  Hypertension  Sleep apnea-treated Diabetes    His device has reached ERI. We have reviewed the benefits and risks of generator replacement.  These include but are not limited to lead fracture and infection.  The patient understands, agrees and is willing to proceed.    I have also been in contact with Dr DM to review the echocardiogram. 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 His cardiac pacemaker probably precludes MRI.  We spent more  than 50% of our >45 min visit in face to face counseling regarding the above

## 2015-05-26 NOTE — Patient Instructions (Signed)
Medication Instructions: - no changes  Labwork: - Your physician recommends that you return for lab work today: BMP/ CBC/ INR  Procedures/Testing: - Your physician has recommended that you have a pacemaker generator change. Please see the instruction sheet given to you today for more information.  Follow-Up: - Your physician recommends that you schedule a follow-up appointment : 10 days (from 06/03/15) with the [facility] for a wound check.   Any Additional Special Instructions Will Be Listed Below (If Applicable). - none

## 2015-05-27 DIAGNOSIS — L602 Onychogryphosis: Secondary | ICD-10-CM | POA: Diagnosis not present

## 2015-05-27 DIAGNOSIS — E1151 Type 2 diabetes mellitus with diabetic peripheral angiopathy without gangrene: Secondary | ICD-10-CM | POA: Diagnosis not present

## 2015-05-27 DIAGNOSIS — I70293 Other atherosclerosis of native arteries of extremities, bilateral legs: Secondary | ICD-10-CM | POA: Diagnosis not present

## 2015-05-27 DIAGNOSIS — L97512 Non-pressure chronic ulcer of other part of right foot with fat layer exposed: Secondary | ICD-10-CM | POA: Diagnosis not present

## 2015-05-27 LAB — PROTIME-INR
INR: 1.11 (ref <1.50)
Prothrombin Time: 14.4 seconds (ref 11.6–15.2)

## 2015-05-28 ENCOUNTER — Telehealth: Payer: Self-pay | Admitting: *Deleted

## 2015-05-28 ENCOUNTER — Other Ambulatory Visit: Payer: Self-pay | Admitting: Internal Medicine

## 2015-05-28 ENCOUNTER — Telehealth: Payer: Self-pay | Admitting: Internal Medicine

## 2015-05-28 ENCOUNTER — Other Ambulatory Visit: Payer: Self-pay

## 2015-05-28 ENCOUNTER — Telehealth: Payer: Self-pay | Admitting: Endocrinology

## 2015-05-28 DIAGNOSIS — I422 Other hypertrophic cardiomyopathy: Secondary | ICD-10-CM

## 2015-05-28 MED ORDER — LIRAGLUTIDE 18 MG/3ML ~~LOC~~ SOPN: 1.8000 mg | PEN_INJECTOR | Freq: Every day | SUBCUTANEOUS | Status: DC

## 2015-05-28 NOTE — Telephone Encounter (Signed)
Continue same dose

## 2015-05-28 NOTE — Telephone Encounter (Signed)
Patient need a refill of victoza send to  [facility] [place], [place] - [address] [phone] (Phone) [phone] (Fax)

## 2015-05-28 NOTE — Telephone Encounter (Signed)
See note below and please advise if ok to refill. Medication is not on current med list.

## 2015-05-28 NOTE — Telephone Encounter (Signed)
rx sent for victoza 1.8 mg.

## 2015-05-28 NOTE — Telephone Encounter (Signed)
[facility] called to report that the patient has a Creat 2.2 drawn 05/26/15. The patient is taking Ceftaz and Cubacin. Advised will let Dr Johnnye Sima know and call back if any changes. Faxing lab results to office.

## 2015-05-28 NOTE — Telephone Encounter (Signed)
New message     Calling to ultrasound results

## 2015-05-29 ENCOUNTER — Encounter: Payer: Self-pay | Admitting: Internal Medicine

## 2015-05-29 DIAGNOSIS — I2510 Atherosclerotic heart disease of native coronary artery without angina pectoris: Secondary | ICD-10-CM | POA: Diagnosis not present

## 2015-05-29 DIAGNOSIS — Z4781 Encounter for orthopedic aftercare following surgical amputation: Secondary | ICD-10-CM | POA: Diagnosis not present

## 2015-05-29 DIAGNOSIS — M869 Osteomyelitis, unspecified: Secondary | ICD-10-CM | POA: Diagnosis not present

## 2015-05-29 DIAGNOSIS — I10 Essential (primary) hypertension: Secondary | ICD-10-CM | POA: Diagnosis not present

## 2015-05-29 DIAGNOSIS — E1169 Type 2 diabetes mellitus with other specified complication: Secondary | ICD-10-CM | POA: Diagnosis not present

## 2015-05-29 NOTE — Telephone Encounter (Signed)
Reviewed with Dr. Caryl Comes yesterday evening. He states he looked at the patient's last echo with Dr. Aundra Dubin- he probably dose have "thick walls" all the way around indicative of HCM. Will need to do a follow up echo with contrast to evaluate.  Also looked at the patient's lab work with Dr. Caryl Comes from 05/26/15- Creatinine up from 1.78 (05/15/15) to 2.29 now. No change to medications noted that I could find.  The patient does take demadex 20 mg two tablets (40 mg) by mouth once daily.  He will also be getting IV antibiotics the morning of his gen change for current infection he is dealing with.  I attempted to call the patient's wife back- I left a message on her identified voice mail that I am only here for a short time today and I will call her back on Monday. I asked that she call back if there is a different # I need to try her at on Monday.

## 2015-05-29 NOTE — Telephone Encounter (Signed)
Close to his baseline thanks

## 2015-06-01 DIAGNOSIS — I10 Essential (primary) hypertension: Secondary | ICD-10-CM | POA: Diagnosis not present

## 2015-06-01 DIAGNOSIS — I2510 Atherosclerotic heart disease of native coronary artery without angina pectoris: Secondary | ICD-10-CM | POA: Diagnosis not present

## 2015-06-01 DIAGNOSIS — Z4781 Encounter for orthopedic aftercare following surgical amputation: Secondary | ICD-10-CM | POA: Diagnosis not present

## 2015-06-01 DIAGNOSIS — E1169 Type 2 diabetes mellitus with other specified complication: Secondary | ICD-10-CM | POA: Diagnosis not present

## 2015-06-01 DIAGNOSIS — M869 Osteomyelitis, unspecified: Secondary | ICD-10-CM | POA: Diagnosis not present

## 2015-06-01 NOTE — Addendum Note (Signed)
Addended by: Alvis Lemmings C on: 06/01/2015 04:14 PM   Modules accepted: Orders

## 2015-06-01 NOTE — Telephone Encounter (Signed)
I spoke with the patient's wife. She is aware of the need for repeat echo w/ contrast. She is also aware of lab results and that we will repeat at time of his procedure. Lastly, she is aware that the patient will be given additional IV antibiotics at the time of his procedure due to the infection in the bone of his foot.

## 2015-06-03 ENCOUNTER — Encounter (HOSPITAL_COMMUNITY): Payer: Self-pay | Admitting: Internal Medicine

## 2015-06-03 ENCOUNTER — Ambulatory Visit (HOSPITAL_COMMUNITY)
Admission: RE | Admit: 2015-06-03 | Discharge: 2015-06-03 | Disposition: A | Discharge status: Home / Self Care | Payer: Medicare Other | Source: Ambulatory Visit | Attending: Internal Medicine | Admitting: Internal Medicine

## 2015-06-03 ENCOUNTER — Encounter (HOSPITAL_COMMUNITY)
Admission: RE | Disposition: A | Discharge status: Home / Self Care | Payer: Medicare Other | Source: Ambulatory Visit | Attending: Internal Medicine

## 2015-06-03 DIAGNOSIS — N400 Enlarged prostate without lower urinary tract symptoms: Secondary | ICD-10-CM | POA: Insufficient documentation

## 2015-06-03 DIAGNOSIS — N183 Chronic kidney disease, stage 3 unspecified: Secondary | ICD-10-CM

## 2015-06-03 DIAGNOSIS — I130 Hypertensive heart and chronic kidney disease with heart failure and stage 1 through stage 4 chronic kidney disease, or unspecified chronic kidney disease: Secondary | ICD-10-CM | POA: Insufficient documentation

## 2015-06-03 DIAGNOSIS — Z7982 Long term (current) use of aspirin: Secondary | ICD-10-CM | POA: Diagnosis not present

## 2015-06-03 DIAGNOSIS — I739 Peripheral vascular disease, unspecified: Secondary | ICD-10-CM | POA: Diagnosis not present

## 2015-06-03 DIAGNOSIS — M1990 Unspecified osteoarthritis, unspecified site: Secondary | ICD-10-CM | POA: Diagnosis not present

## 2015-06-03 DIAGNOSIS — D509 Iron deficiency anemia, unspecified: Secondary | ICD-10-CM | POA: Diagnosis not present

## 2015-06-03 DIAGNOSIS — I152 Hypertension secondary to endocrine disorders: Secondary | ICD-10-CM | POA: Diagnosis present

## 2015-06-03 DIAGNOSIS — Z794 Long term (current) use of insulin: Secondary | ICD-10-CM | POA: Insufficient documentation

## 2015-06-03 DIAGNOSIS — Z950 Presence of cardiac pacemaker: Secondary | ICD-10-CM

## 2015-06-03 DIAGNOSIS — N2581 Secondary hyperparathyroidism of renal origin: Secondary | ICD-10-CM | POA: Diagnosis not present

## 2015-06-03 DIAGNOSIS — E1151 Type 2 diabetes mellitus with diabetic peripheral angiopathy without gangrene: Secondary | ICD-10-CM | POA: Insufficient documentation

## 2015-06-03 DIAGNOSIS — G4733 Obstructive sleep apnea (adult) (pediatric): Secondary | ICD-10-CM | POA: Diagnosis not present

## 2015-06-03 DIAGNOSIS — E1122 Type 2 diabetes mellitus with diabetic chronic kidney disease: Secondary | ICD-10-CM | POA: Diagnosis not present

## 2015-06-03 DIAGNOSIS — I499 Cardiac arrhythmia, unspecified: Secondary | ICD-10-CM

## 2015-06-03 DIAGNOSIS — E1142 Type 2 diabetes mellitus with diabetic polyneuropathy: Secondary | ICD-10-CM | POA: Diagnosis not present

## 2015-06-03 DIAGNOSIS — Z85828 Personal history of other malignant neoplasm of skin: Secondary | ICD-10-CM | POA: Insufficient documentation

## 2015-06-03 DIAGNOSIS — I509 Heart failure, unspecified: Secondary | ICD-10-CM | POA: Diagnosis not present

## 2015-06-03 DIAGNOSIS — I495 Sick sinus syndrome: Secondary | ICD-10-CM | POA: Diagnosis not present

## 2015-06-03 DIAGNOSIS — L97519 Non-pressure chronic ulcer of other part of right foot with unspecified severity: Secondary | ICD-10-CM | POA: Diagnosis not present

## 2015-06-03 DIAGNOSIS — F419 Anxiety disorder, unspecified: Secondary | ICD-10-CM | POA: Diagnosis not present

## 2015-06-03 DIAGNOSIS — E1159 Type 2 diabetes mellitus with other circulatory complications: Secondary | ICD-10-CM | POA: Diagnosis present

## 2015-06-03 DIAGNOSIS — Z45010 Encounter for checking and testing of cardiac pacemaker pulse generator [battery]: Secondary | ICD-10-CM | POA: Insufficient documentation

## 2015-06-03 DIAGNOSIS — I10 Essential (primary) hypertension: Secondary | ICD-10-CM

## 2015-06-03 DIAGNOSIS — I2510 Atherosclerotic heart disease of native coronary artery without angina pectoris: Secondary | ICD-10-CM | POA: Diagnosis not present

## 2015-06-03 HISTORY — DX: Cardiac arrhythmia, unspecified: I49.9

## 2015-06-03 HISTORY — DX: Presence of cardiac pacemaker: Z95.0

## 2015-06-03 HISTORY — PX: EP IMPLANTABLE DEVICE: SHX172B

## 2015-06-03 LAB — SURGICAL PCR SCREEN
MRSA, PCR: NEGATIVE
Staphylococcus aureus: NEGATIVE

## 2015-06-03 LAB — BASIC METABOLIC PANEL
Anion gap: 10 (ref 5–15)
BUN: 40 mg/dL — ABNORMAL HIGH (ref 6–20)
CO2: 27 mmol/L (ref 22–32)
Calcium: 9.4 mg/dL (ref 8.9–10.3)
Chloride: 101 mmol/L (ref 101–111)
Creatinine, Ser: 1.94 mg/dL — ABNORMAL HIGH (ref 0.61–1.24)
GFR calc Af Amer: 37 mL/min — ABNORMAL LOW (ref >60)
GFR calc non Af Amer: 32 mL/min — ABNORMAL LOW (ref >60)
Glucose, Bld: 249 mg/dL — ABNORMAL HIGH (ref 65–99)
Potassium: 4.8 mmol/L (ref 3.5–5.1)
Sodium: 138 mmol/L (ref 135–145)

## 2015-06-03 LAB — GLUCOSE, CAPILLARY: Glucose-Capillary: 215 mg/dL — ABNORMAL HIGH (ref 65–99)

## 2015-06-03 SURGERY — PPM/BIV PPM GENERATOR CHANGEOUT
Anesthesia: LOCAL

## 2015-06-03 MED ORDER — FENTANYL CITRATE (PF) 100 MCG/2ML IJ SOLN
INTRAMUSCULAR | Status: DC | PRN
  Administered 2015-06-03: 25 ug via INTRAVENOUS

## 2015-06-03 MED ORDER — CEFAZOLIN SODIUM-DEXTROSE 2-3 GM-% IV SOLR
2.0000 g | INTRAVENOUS | Status: AC
  Administered 2015-06-03: 2 g via INTRAVENOUS

## 2015-06-03 MED ORDER — MIDAZOLAM HCL 5 MG/5ML IJ SOLN
INTRAMUSCULAR | Status: DC | PRN
  Administered 2015-06-03: 2 mg via INTRAVENOUS
  Administered 2015-06-03: 1 mg via INTRAVENOUS

## 2015-06-03 MED ORDER — LIDOCAINE HCL (PF) 1 % IJ SOLN
INTRAMUSCULAR | Status: AC
  Filled 2015-06-03: qty 30

## 2015-06-03 MED ORDER — ACETAMINOPHEN 325 MG PO TABS
325.0000 mg | ORAL_TABLET | ORAL | Status: DC | PRN
  Filled 2015-06-03: qty 2

## 2015-06-03 MED ORDER — SODIUM CHLORIDE 0.9 % IV SOLN: INTRAVENOUS | Status: AC

## 2015-06-03 MED ORDER — CEFAZOLIN SODIUM-DEXTROSE 2-3 GM-% IV SOLR
INTRAVENOUS | Status: AC
  Filled 2015-06-03: qty 50

## 2015-06-03 MED ORDER — SODIUM CHLORIDE 0.9 % IR SOLN: 80.0000 mg | Status: DC

## 2015-06-03 MED ORDER — ONDANSETRON HCL 4 MG/2ML IJ SOLN: 4.0000 mg | Freq: Four times a day (QID) | INTRAMUSCULAR | Status: DC | PRN

## 2015-06-03 MED ORDER — SODIUM CHLORIDE 0.9 % IR SOLN
Status: AC
  Filled 2015-06-03: qty 2

## 2015-06-03 MED ORDER — LIDOCAINE HCL (PF) 1 % IJ SOLN
INTRAMUSCULAR | Status: AC
  Filled 2015-06-03: qty 60

## 2015-06-03 MED ORDER — MUPIROCIN 2 % EX OINT
TOPICAL_OINTMENT | CUTANEOUS | Status: AC
  Administered 2015-06-03: 1 via TOPICAL
  Filled 2015-06-03: qty 22

## 2015-06-03 MED ORDER — CEFAZOLIN SODIUM 1-5 GM-% IV SOLN
1.0000 g | Freq: Once | INTRAVENOUS | Status: AC
  Administered 2015-06-03 (×2): 1 g via INTRAVENOUS

## 2015-06-03 MED ORDER — FENTANYL CITRATE (PF) 100 MCG/2ML IJ SOLN
INTRAMUSCULAR | Status: AC
  Filled 2015-06-03: qty 2

## 2015-06-03 MED ORDER — MIDAZOLAM HCL 5 MG/5ML IJ SOLN
INTRAMUSCULAR | Status: AC
  Filled 2015-06-03: qty 5

## 2015-06-03 MED ORDER — MUPIROCIN 2 % EX OINT
1.0000 "application " | TOPICAL_OINTMENT | Freq: Once | CUTANEOUS | Status: AC
  Administered 2015-06-03: 1 via TOPICAL

## 2015-06-03 MED ORDER — SODIUM CHLORIDE 0.9 % IV SOLN
INTRAVENOUS | Status: DC
  Administered 2015-06-03: 09:00:00 via INTRAVENOUS

## 2015-06-03 SURGICAL SUPPLY — 4 items
CABLE SURGICAL S-101-97-12 (CABLE) ×2 IMPLANT
PAD DEFIB LIFELINK (PAD) ×2 IMPLANT
PPM ADVISA MRI DR A2DR01 (Pacemaker) ×2 IMPLANT
TRAY PACEMAKER INSERTION (PACKS) ×2 IMPLANT

## 2015-06-03 NOTE — Discharge Instructions (Signed)

## 2015-06-03 NOTE — H&P (View-Only) (Signed)
Patient Care Team: [name], FNP as PCP - General (Family Medicine) [name], MD as Consulting Physician (Podiatry) [name], MD as Consulting Physician (Nephrology)   HPI  David [name] is a 75 y.o. male Seen in followup for pacemaker implanted for sinus node dysfunction and dyspnea on exertion. Cardiac-wise exhibited stable. He denies chest pain or limiting shortness of breath. There is mild edema.  Echocardiogram 6/15 normal LV function and severe left ventricular hypertrophy with left atrial enlargement consistent with hypertensive heart disease  Evaluation [place] included a Myoview that was false positive catheterizations were undertaken in 2009, then, 12 demonstrating 60% of the RCA, 70% in 240% circumflex and 60% OM1 40% LAD.   He has renal insufficiency grade 3 with a GFR 12/16 of 1.78. Potassium level was 4.5 around that same time; he is followed closely by nephrology   Intercurrently he has suffered from a pneumonia, a fractured leg that occurred in the hospital when he slipped on the floor, and infection in the contralateral foot.  His device has reached ERI and with reversion has developed progressive exercise intolerance.  Past Medical History  Diagnosis Date  . Hypertension   . Arthritis   . Anxiety   . Sinus node dysfunction (HCC)   . Cardiac pacemaker in situ   . CHF (congestive heart failure) ([place])   . CAD (coronary artery disease)     Nonobstructive CAD per cath  . Dyspnea   . 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 Insulin pump in place   . Anemia, iron deficiency   . Secondary hyperparathyroidism of renal origin ([place])   . 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 History of skin cancer   . Chronic ulcer of right foot ([place])   . History of cellulitis     right great toe 10-25-2014  . BPH (benign prostatic  hypertrophy)   . Peripheral neuropathy (HCC)     severe  . Peripheral vascular disease ([place])     bilateral lower extremities  . OSA on CPAP     Past Surgical History  Procedure Laterality Date  . Vein ligation and stripping    . 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 Orif ankle fracture Left 11/06/2014    Procedure: OPEN REDUCTION INTERNAL FIXATION (ORIF) LEFT  ANKLE FRACTURE;  Surgeon: [name], MD;  Location: [facility];  Service: Orthopedics;  Laterality: Left;  . Cardiac catheterization  11-25-2010   [place], [place]    Nonobstructive CAD  . Cardiac pacemaker placement  Nov 2009    Medtronic  . Total knee arthroplasty    . Excision bone cyst Right 03/06/2015    Procedure: BONE BIOPSIES OF RIGHT FOOT;  Surgeon: [name], MD;  Location: [facility];  Service: Podiatry;  Laterality: Right;  . Amputation toe Right 03/12/2015    Procedure: RIGHT HALLUS AMPUTATION ;  Surgeon: [name], MD;  Location: [facility];  Service: Podiatry;  Laterality: Right;    Current Outpatient Prescriptions  Medication Sig Dispense Refill  . acetaminophen (TYLENOL) 500 MG tablet Take 1,000 mg by mouth every 6 (six) hours as needed for fever.    Marland Kitchen aspirin EC 325 MG tablet Take 1 tablet (325 mg total) by mouth daily.  42 tablet 0  . calcitRIOL (ROCALTROL) 0.25 MCG capsule Take 0.25 mcg by mouth daily.     . carvedilol (COREG) 25 MG tablet Take 1 tablet (25 mg total) by mouth 2 (two) times daily with a meal. 90 tablet 1  . CUBICIN 500 MG injection     . DAPTOmycin 746 mg in sodium chloride 0.9 % 100 mL Inject 746 mg into the vein daily.    Marland Kitchen doxazosin (CARDURA) 4 MG tablet Take 1 tablet (4 mg total) by mouth daily. 90 tablet 1  . insulin aspart (NOVOLOG) 100 UNIT/ML injection Use max 120 units per day with insulin pump. 40 mL 5  . isosorbide mononitrate (IMDUR) 30 MG 24 hr tablet TAKE TWO TABLETS BY MOUTH ONCE DAILY  60 tablet 0  . neomycin-polymyxin-hydrocortisone (CORTISPORIN) otic solution Place 3 drops into the left ear 4 (four) times daily. 10 mL 0  . Omega-3 Fatty Acids (FISH OIL) 1000 MG CAPS Take by mouth 2 (two) times daily.    . pantoprazole (PROTONIX) 40 MG tablet Take 1 tablet (40 mg total) by mouth daily. 90 tablet 1  . SANTYL ointment Apply 1 application topically.  3  . sertraline (ZOLOFT) 50 MG tablet Take 1 tablet (50 mg total) by mouth daily. 30 tablet 11  . torsemide (DEMADEX) 20 MG tablet Take 2 tablets (40 mg total) by mouth daily. 180 tablet 1   No current facility-administered medications for this visit.    No Known Allergies  Review of Systems negative except from HPI and PMH  Physical Exam BP 108/54 mmHg  Pulse 65  Ht 6\' 4"  (1.93 m)  Wt 274 lb 3.2 oz (124.376 kg)  BMI 33.39 kg/m2 Well developed and well nourished in no acute distress HENT normal E scleral and icterus clear Neck Supple JVP flat; carotids brisk and full Clear to ausculation  Regular rate and rhythm, no murmurs gallops or rub Soft with active bowel sounds No clubbing cyanosis 1+ Edema  Wound infection  Alert and oriented, grossly normal motor and sensory function Skin Warm and Dry  ECG demonstrates atrial pacing at 67@intervals  24/12/44 Poor R-wave progression and inferolateral T wave is  Assessment and  Plan Sinus node dysfunction Pacemaker  Medtronic  Renal dysfunction  Hypertension  Sleep apnea-treated Diabetes    His device has reached ERI. We have reviewed the benefits and risks of generator replacement.  These include but are not limited to lead fracture and infection.  The patient understands, agrees and is willing to proceed.    I have also been in contact with Dr DM to review the echocardiogram. 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 His cardiac pacemaker probably precludes MRI.  We spent more  than 50% of our >45 min visit in face to face counseling regarding the above

## 2015-06-03 NOTE — Interval H&P Note (Signed)
History and Physical Interval Note:  06/03/2015 10:08 AM  [name]  has presented today for surgery, with the diagnosis of ERI  The various methods of treatment have been discussed with the patient and family. After consideration of risks, benefits and other options for treatment, the patient has consented to  Procedure(s): PPM Generator Changeout (N/A) as a surgical intervention .  The patient's history has been reviewed, patient examined, no change in status, stable for surgery.  I have reviewed the patient's chart and labs.  Questions were answered to the patient's satisfaction.     Virl Axe  Renal function remains somewhat worse,  follwoup to be scheduled for renal Review echo symmmetric LVH 19 mm  prob HCM  Recommended that family screeingin of1 degree relatives

## 2015-06-04 MED FILL — Sodium Chloride Irrigation Soln 0.9%: Qty: 500 | Status: AC

## 2015-06-04 MED FILL — Lidocaine HCl Local Preservative Free (PF) Inj 1%: INTRAMUSCULAR | Qty: 60 | Status: AC

## 2015-06-04 MED FILL — Gentamicin Sulfate Inj 40 MG/ML: INTRAMUSCULAR | Qty: 2 | Status: AC

## 2015-06-05 DIAGNOSIS — E1151 Type 2 diabetes mellitus with diabetic peripheral angiopathy without gangrene: Secondary | ICD-10-CM | POA: Diagnosis not present

## 2015-06-05 DIAGNOSIS — I70293 Other atherosclerosis of native arteries of extremities, bilateral legs: Secondary | ICD-10-CM | POA: Diagnosis not present

## 2015-06-05 DIAGNOSIS — L97512 Non-pressure chronic ulcer of other part of right foot with fat layer exposed: Secondary | ICD-10-CM | POA: Diagnosis not present

## 2015-06-08 ENCOUNTER — Ambulatory Visit (INDEPENDENT_AMBULATORY_CARE_PROVIDER_SITE_OTHER): Payer: Medicare Other | Admitting: Infectious Diseases

## 2015-06-08 ENCOUNTER — Encounter: Payer: Self-pay | Admitting: Infectious Diseases

## 2015-06-08 VITALS — BP 138/78 | HR 60 | Temp 98.0°F | Ht 76.0 in | Wt 280.5 lb

## 2015-06-08 DIAGNOSIS — E1149 Type 2 diabetes mellitus with other diabetic neurological complication: Secondary | ICD-10-CM

## 2015-06-08 DIAGNOSIS — E1165 Type 2 diabetes mellitus with hyperglycemia: Secondary | ICD-10-CM

## 2015-06-08 DIAGNOSIS — M86171 Other acute osteomyelitis, right ankle and foot: Secondary | ICD-10-CM

## 2015-06-08 DIAGNOSIS — I2510 Atherosclerotic heart disease of native coronary artery without angina pectoris: Secondary | ICD-10-CM | POA: Diagnosis not present

## 2015-06-08 DIAGNOSIS — IMO0002 Reserved for concepts with insufficient information to code with codable children: Secondary | ICD-10-CM

## 2015-06-08 MED ORDER — LEVOFLOXACIN 750 MG PO TABS: 750.0000 mg | ORAL_TABLET | Freq: Every day | ORAL | Status: DC

## 2015-06-08 MED ORDER — DOXYCYCLINE HYCLATE 100 MG PO TABS: 100.0000 mg | ORAL_TABLET | Freq: Two times a day (BID) | ORAL | Status: DC

## 2015-06-08 NOTE — Progress Notes (Signed)
Phone call to [facility] to provide order from Dr. Johnnye Sima to discontinue IV antibiotics on Jan. 30 and remove PICC.  Pt has a right IJ single lumen power injectable central venous catheter.  [facility] shared that [facility] does have RNs who remove this type of line.

## 2015-06-08 NOTE — Assessment & Plan Note (Signed)
Glc have been in mid 100s Appreciate PCP f/u.

## 2015-06-08 NOTE — Progress Notes (Signed)
   Subjective:    Patient ID: David [name], male    DOB: May 09, 1941, 75 y.o.   MRN: [id number]  HPI 75 yo M with hx of DM2 on insulin with neuropathy, CKD3 (GFR 34), CHF/CAD/pacer, PVD and R foot ulcer > 1 year.  He was hospitalized in June 2016 and had wound Cx which grew pseudomonas.  He had cx of this wound on 10-21 that grew CNS, and diphtheroids x 2.  He underwent R great toe partial amputation on 10-27. His bone bx confirmed osteo, there is no Cx from this in EPIC or in the records that were sent with him..  Per he and his wife he has had incomplete wound healing and had "yellow, gunky" stuff from his wound.  No fever/chills. No LE erythema.  Has been on anbx (levaquin) for ~ 1 month.  He was seen in ID on 11-21 and had ABI (mod-severe bilateral), he was reffered to vascular surgery, he was continued on his po anbx.  On 12-2 he underwent CT of his R foot: Erosion of the plantar cortex of the stump of the proximal phalangeal bone of the great toe which is of concern for osteomyelitis. He was seen in ID on 12-19 and restarted on IV anbx (ceftaz/cubicin).  Today feels like his toe is doing better. Significantly less d/c. No F/C. No problems with PIC. Had tape rxn once.  CRP (1-17) 2.4.  His Ck's have been normal.  Got new pacer battery 1-18.   Review of Systems  Constitutional: Negative for fever and chills.  Gastrointestinal: Negative for diarrhea and constipation.  Genitourinary: Negative for difficulty urinating.  had few episodes of diarrhea which he attributes to his diet.      Objective:   Physical Exam  Constitutional: He appears well-developed and well-nourished.  Pulmonary/Chest:    Musculoskeletal:       Feet:          Assessment & Plan:

## 2015-06-08 NOTE — Assessment & Plan Note (Signed)
He is doing well.  I am concerns about the long term of his foot despite he and his wife being encouraged by his imrpovement.  Will stop his IV anbx on 1-30 (6 weeks) and then change him to doxy/levaquin.  Will see him back in 6 weeks.

## 2015-06-08 NOTE — Addendum Note (Signed)
Addended by: Lorne Skeens D on: 06/08/2015 12:05 PM   Modules accepted: Orders

## 2015-06-09 ENCOUNTER — Other Ambulatory Visit: Payer: Self-pay | Admitting: *Deleted

## 2015-06-09 DIAGNOSIS — E1169 Type 2 diabetes mellitus with other specified complication: Secondary | ICD-10-CM | POA: Diagnosis not present

## 2015-06-09 DIAGNOSIS — I10 Essential (primary) hypertension: Secondary | ICD-10-CM | POA: Diagnosis not present

## 2015-06-09 DIAGNOSIS — I2510 Atherosclerotic heart disease of native coronary artery without angina pectoris: Secondary | ICD-10-CM | POA: Diagnosis not present

## 2015-06-09 DIAGNOSIS — Z4781 Encounter for orthopedic aftercare following surgical amputation: Secondary | ICD-10-CM | POA: Diagnosis not present

## 2015-06-09 DIAGNOSIS — M869 Osteomyelitis, unspecified: Secondary | ICD-10-CM | POA: Diagnosis not present

## 2015-06-09 MED ORDER — INSULIN ASPART 100 UNIT/ML ~~LOC~~ SOLN: SUBCUTANEOUS | Status: DC

## 2015-06-11 DIAGNOSIS — I70293 Other atherosclerosis of native arteries of extremities, bilateral legs: Secondary | ICD-10-CM | POA: Diagnosis not present

## 2015-06-11 DIAGNOSIS — E11621 Type 2 diabetes mellitus with foot ulcer: Secondary | ICD-10-CM | POA: Diagnosis not present

## 2015-06-11 DIAGNOSIS — L97512 Non-pressure chronic ulcer of other part of right foot with fat layer exposed: Secondary | ICD-10-CM | POA: Diagnosis not present

## 2015-06-11 DIAGNOSIS — E1151 Type 2 diabetes mellitus with diabetic peripheral angiopathy without gangrene: Secondary | ICD-10-CM | POA: Diagnosis not present

## 2015-06-12 ENCOUNTER — Other Ambulatory Visit: Payer: Self-pay | Admitting: Internal Medicine

## 2015-06-15 ENCOUNTER — Encounter: Payer: Self-pay | Admitting: Internal Medicine

## 2015-06-15 ENCOUNTER — Ambulatory Visit (INDEPENDENT_AMBULATORY_CARE_PROVIDER_SITE_OTHER): Payer: Medicare Other | Admitting: *Deleted

## 2015-06-15 DIAGNOSIS — I495 Sick sinus syndrome: Secondary | ICD-10-CM | POA: Diagnosis not present

## 2015-06-15 DIAGNOSIS — Z950 Presence of cardiac pacemaker: Secondary | ICD-10-CM | POA: Diagnosis not present

## 2015-06-15 LAB — CUP PACEART INCLINIC DEVICE CHECK
Battery Voltage: 3.09 V
Brady Statistic AP VP Percent: 0.06 %
Brady Statistic AP VS Percent: 99.79 %
Brady Statistic AS VP Percent: 0 %
Brady Statistic AS VS Percent: 0.15 %
Brady Statistic RA Percent Paced: 99.85 %
Brady Statistic RV Percent Paced: 0.06 %
Date Time Interrogation Session: 20170130161553
Implantable Lead Implant Date: 20091102
Implantable Lead Implant Date: 20091102
Implantable Lead Location: 753859
Implantable Lead Location: 753860
Implantable Lead Model: 5076
Implantable Lead Model: 5076
Lead Channel Impedance Value: 399 Ohm
Lead Channel Impedance Value: 399 Ohm
Lead Channel Impedance Value: 494 Ohm
Lead Channel Impedance Value: 532 Ohm
Lead Channel Pacing Threshold Amplitude: 1 V
Lead Channel Pacing Threshold Amplitude: 1.25 V
Lead Channel Pacing Threshold Pulse Width: 0.4 ms
Lead Channel Pacing Threshold Pulse Width: 1 ms
Lead Channel Sensing Intrinsic Amplitude: 18.375 mV
Lead Channel Sensing Intrinsic Amplitude: 2.375 mV
Lead Channel Setting Pacing Amplitude: 2.5 V
Lead Channel Setting Pacing Amplitude: 2.5 V
Lead Channel Setting Pacing Pulse Width: 0.4 ms
Lead Channel Setting Sensing Sensitivity: 2.8 mV

## 2015-06-15 NOTE — Progress Notes (Signed)
Wound check appointment s/p generator change. Dermabond removed. Wound without redness or edema. Incision edges approximated, wound well healed. Normal device function. Thresholds, sensing, and impedances consistent with implant measurements. Device programmed at chronic outputs; reprogrammed RA output from 2.0V to 2.5V. Histogram distribution appropriate for patient and level of activity. No mode switches or high ventricular rates noted. Patient educated about wound care, arm mobility, lifting restrictions. [facility] on 09/14/15 and ROV with SK in 12 months.

## 2015-06-16 ENCOUNTER — Encounter: Payer: Self-pay | Admitting: Infectious Diseases

## 2015-06-16 DIAGNOSIS — I10 Essential (primary) hypertension: Secondary | ICD-10-CM | POA: Diagnosis not present

## 2015-06-16 DIAGNOSIS — M869 Osteomyelitis, unspecified: Secondary | ICD-10-CM | POA: Diagnosis not present

## 2015-06-16 DIAGNOSIS — E1169 Type 2 diabetes mellitus with other specified complication: Secondary | ICD-10-CM | POA: Diagnosis not present

## 2015-06-16 DIAGNOSIS — I2510 Atherosclerotic heart disease of native coronary artery without angina pectoris: Secondary | ICD-10-CM | POA: Diagnosis not present

## 2015-06-16 DIAGNOSIS — Z4781 Encounter for orthopedic aftercare following surgical amputation: Secondary | ICD-10-CM | POA: Diagnosis not present

## 2015-06-17 ENCOUNTER — Ambulatory Visit (INDEPENDENT_AMBULATORY_CARE_PROVIDER_SITE_OTHER): Payer: Medicare Other | Admitting: Endocrinology

## 2015-06-17 ENCOUNTER — Encounter: Payer: Self-pay | Admitting: Endocrinology

## 2015-06-17 VITALS — BP 124/60 | HR 69 | Temp 97.7°F | Resp 16 | Ht 76.0 in | Wt 281.6 lb

## 2015-06-17 DIAGNOSIS — I2510 Atherosclerotic heart disease of native coronary artery without angina pectoris: Secondary | ICD-10-CM

## 2015-06-17 DIAGNOSIS — E1165 Type 2 diabetes mellitus with hyperglycemia: Secondary | ICD-10-CM | POA: Diagnosis not present

## 2015-06-17 DIAGNOSIS — Z794 Long term (current) use of insulin: Secondary | ICD-10-CM

## 2015-06-17 NOTE — Patient Instructions (Signed)
Basal rates as on printout  Bolus 10 min before meal  Check blood sugars on waking up 5-6 times a week  Also check blood sugars about 2 hours after a meal and do this after different meals by rotation  Recommended blood sugar levels on waking up is 90-130 and about 2 hours after meal is 130-160   Reduce fat intake in all meals, fatty meats

## 2015-06-17 NOTE — Progress Notes (Signed)
Patient ID: David [name], male   DOB: 09/11/1940, 75 y.o.   MRN: [id number]    Reason for Appointment: Followup for Type 2 Diabetes  Referring physician: Megan Salon  History of Present Illness:          Diagnosis: Type 2 diabetes mellitus, date of diagnosis:   1992       Past history:  He was initially treated with metformin and at some point also glipizide. His previous records from out of town are not available and no information is available about his level of control Apparently he was started on insulin in 1994 approximately because of poor control He has been on various insulin regimens over the last several years However even with insulin he has had poor control for at least the last 7 or 8 years. He does not know what his previous A1c levels have been. He had been continued on metformin and glipizide but metformin stopped about 2 years ago because of kidney function abnormality He had been taking Lantus 60 units twice a day with NovoLog previously and also Before his initial consultation in 6/15 he was on NovoLog twice a day and Humalog mix insulin  Because of poor control and large insulin doses he was started on Victoza in 6/15  Recent history:   INSULIN PUMP  regimen is as follows   Basal rates:Midnight = 2.9, 6 AM = 3.55, 1 PM = 3.8 and 10 PM = 3.0  Carbohydrate coverage 1:7 for lunch otherwise 1:5, correction 1:10 with target 120-150  Average total daily insulin 96 +/-4 units, basal insulin is 81 %  Since 04/28/14 he has been on a Medtronic insulin pump because of persistent poor control and high insulin requirement Initially with starting the pump his blood sugars had been significantly better but subsequently blood sugars have been again high He was shown how to program his basal rates again by the nurse educator on the last visit  He is now here for short-term follow-up  Current management, problems and blood sugar patterns:  Checking blood sugars  only before the meals and No readings after meals as directed  Since he is checking his blood sugar only at his 2 meals not clear what his postprandial readings are  He sometimes has blood sugars earlier in the morning at 8 AM which appear to be more consistently high; however previously when overnight basal rates were increased his fasting readings are tending to be low  He has only one blood sugar right after eating breakfast which was over 300  Blood sugars are variable at suppertime and still about the same as before  Still his HIGHEST blood sugars are before his first meal  He does the boluses right before eating and he thinks that he does this even when he is eating out  Does not look at fat intake and his diet and is not aware of foods that are high in fat  Now taking Victoza more regularly  Not able to do any exercise       Oral hypoglycemic drugs the patient is taking are: None, has renal dysfunction        Glucose monitoring:  done about 2-3 times a day         Glucometer:  contour     Blood Glucose readings from pump download:  Mean values apply above for all meters except median for One Touch  PRE-MEAL Fasting Lunch Dinner Bedtime Overall  Glucose range:  130-292  82-290     Mean/median:  210   181  195   Hypoglycemia: None   Glycemic control:   Lab Results  Component Value Date   HGBA1C 8.8* 05/15/2015   HGBA1C 8.7* 12/15/2014   HGBA1C 11.5* 10/26/2014   Lab Results  Component Value Date   MICROALBUR 9.2* 05/15/2015   LDLCALC 49 10/29/2013   CREATININE 1.94* 06/03/2015    Self-care: The diet that the patient has been following is: tries to limit fat intake   Meals: 2- 3 meals per day.breakfast is either cereal or eggs and toast, lunch is usually a sandwich , dinner at  6-7 pm.   He thinks his portions are small but periodically eats sweets          Exercise:  not doing exercise bike, previously  was doing 15-30 min        Dietician visit: Most  recent: several years ago.               Compliance with the medical regimen: good Retinal exam: Most recent: 05/2013.    Weight history:  Wt Readings from Last 3 Encounters:  06/17/15 281 lb 9.6 oz (127.733 kg)  06/08/15 280 lb 8 oz (127.234 kg)  06/03/15 274 lb (124.286 kg)        Medication List       This list is accurate as of: 06/17/15  3:53 PM.  Always use your most recent med list.               acetaminophen 500 MG tablet  Commonly known as:  TYLENOL  Take 1,000 mg by mouth every 6 (six) hours as needed for fever. Reported on 06/17/2015     aspirin EC 325 MG tablet  Take 1 tablet (325 mg total) by mouth daily.     calcitRIOL 0.25 MCG capsule  Commonly known as:  ROCALTROL  Take 0.25 mcg by mouth daily.     carvedilol 25 MG tablet  Commonly known as:  COREG  Take 1 tablet (25 mg total) by mouth 2 (two) times daily with a meal.     CUBICIN 500 MG injection  Generic drug:  DAPTOmycin  Reported on 06/17/2015     DAPTOmycin 746 mg in sodium chloride 0.9 % 100 mL  Inject 746 mg into the vein daily.     doxazosin 4 MG tablet  Commonly known as:  CARDURA  Take 1 tablet (4 mg total) by mouth daily.     doxycycline 100 MG tablet  Commonly known as:  VIBRA-TABS  Take 1 tablet (100 mg total) by mouth 2 (two) times daily.     Fish Oil 1000 MG Caps  Take by mouth 2 (two) times daily.     insulin aspart 100 UNIT/ML injection  Commonly known as:  NOVOLOG  Use max 120 units per day with CONTINUOUS  insulin pump. DX CODE E11.49     isosorbide mononitrate 30 MG 24 hr tablet  Commonly known as:  IMDUR  TAKE TWO TABLETS BY MOUTH ONCE DAILY     levofloxacin 750 MG tablet  Commonly known as:  LEVAQUIN  Take 1 tablet (750 mg total) by mouth daily.     Liraglutide 18 MG/3ML Sopn  Commonly known as:  VICTOZA  Inject 0.3 mLs (1.8 mg total) into the skin daily.     neomycin-polymyxin-hydrocortisone otic solution  Commonly known as:  CORTISPORIN  Place 3 drops into  the left ear 4 (four) times daily.  pantoprazole 40 MG tablet  Commonly known as:  PROTONIX  Take 1 tablet (40 mg total) by mouth daily.     SANTYL ointment  Generic drug:  collagenase  Apply 1 application topically. Reported on 06/17/2015     sertraline 50 MG tablet  Commonly known as:  ZOLOFT  Take 1 tablet (50 mg total) by mouth daily.     torsemide 20 MG tablet  Commonly known as:  DEMADEX  Take 2 tablets (40 mg total) by mouth daily.        Allergies: No Known Allergies  Past Medical History  Diagnosis Date  . Hypertension   . Arthritis   . Anxiety   . Sinus node dysfunction (HCC)   . Cardiac pacemaker in situ   . CHF (congestive heart failure) ([place])   . CAD (coronary artery disease)     Nonobstructive CAD per cath  . Dyspnea   . Insulin dependent type 2 diabetes mellitus ([place]) 1991    followd by [name]--  has insulin pump  . Insulin pump in place   . Anemia, iron deficiency   . Secondary hyperparathyroidism of renal origin ([place])   . 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 History of skin cancer   . Chronic ulcer of right foot ([place])   . History of cellulitis     right great toe 10-25-2014  . BPH (benign prostatic hypertrophy)   . Peripheral neuropathy (HCC)     severe  . Peripheral vascular disease ([place])     bilateral lower extremities  . OSA on CPAP     Past Surgical History  Procedure Laterality Date  . Vein ligation and stripping    . 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 Orif ankle fracture Left 11/06/2014    Procedure: OPEN REDUCTION INTERNAL FIXATION (ORIF) LEFT  ANKLE FRACTURE;  Surgeon: [name], MD;  Location: [facility];  Service: Orthopedics;  Laterality: Left;  . Cardiac catheterization  11-25-2010   [place], [place]    Nonobstructive CAD  . Cardiac pacemaker placement  Nov 2009    Medtronic  . Total knee arthroplasty      . Excision bone cyst Right 03/06/2015    Procedure: BONE BIOPSIES OF RIGHT FOOT;  Surgeon: [name], MD;  Location: [facility];  Service: Podiatry;  Laterality: Right;  . Amputation toe Right 03/12/2015    Procedure: RIGHT HALLUS AMPUTATION ;  Surgeon: [name], MD;  Location: [facility];  Service: Podiatry;  Laterality: Right;  . Ep implantable device N/A 06/03/2015    Procedure: PPM Generator Changeout;  Surgeon: [name], MD;  Location: [facility];  Service: Cardiovascular;  Laterality: N/A;    Family History  Problem Relation Age of Onset  . Cancer Mother     breast  . Heart attack Father     Social History:  reports that he quit smoking about 31 years ago. He has never used smokeless tobacco. He reports that he drinks about 0.6 oz of alcohol per week. He reports that he does not use illicit drugs.    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 Also had taken Gembrozil for several years without apparent side effects.  Both of these were  stopped because of increased liver functions and LFT has been back to normal  LDL has not been high subsequently but HDL is low Currently taking only fish oil and LDL is within target but triglycerides are high Currently lipids are followed by PCP       Lab Results  Component Value Date   CHOL 183 05/15/2015   HDL 22.70* 05/15/2015   LDLCALC 49 10/29/2013   LDLDIRECT 69.0 05/15/2015   TRIG 336.0* 05/15/2015   CHOLHDL 8 05/15/2015      HYPERTENSION: The blood pressure has been treated with various drugs.   Currently taking 4 mg doxazosin, carvedilol, lisinopril    Chronic kidney disease: Etiology unknown, is followed by nephrologist  Creatinine level has been variable Last creatinine:  Lab Results  Component Value Date    CREATININE 1.94* 06/03/2015        Has history of Numbness in his feet  since about 2013, no tingling or burning in feet    Previous foot exam findings:   Vibration sense is absent in toes. Ankle jerks are absent bilaterally.          Diabetic foot exam:  No callus formation. Mild denudation of skin on left second toe superiorly Amputation of right third toe. Thickening and dryness of skin on the toes on the right Absent monofilament sensation in the feet Absent pedal pulses     Physical Examination:  BP 124/60 mmHg  Pulse 69  Temp(Src) 97.7 F (36.5 C)  Resp 16  Ht 6\' 4"  (1.93 m)  Wt 281 lb 9.6 oz (127.733 kg)  BMI 34.29 kg/m2  SpO2 96%   ASSESSMENT:  Diabetes type 2, uncontrolled with neuropathy. His blood sugars are overall still difficult to control despite nearly 100 units of insulin a day with this pump See history of present illness for detailed discussion of his current management, blood sugar patterns and problems identified  He is getting high readings before his first meal but not clear if he is getting hyperglycemia through the night Blood sugars are variable at suppertime but still mostly high Most likely is having some postprandial readings that are high also Compliance with diet is fair but he has gained weight.  Not aware of high fat foods Not able to exercise currently because of foot ulcer  PLAN:   Pump settings will be changed   Try to bolus 10 minutes before eating  Low-fat diet, given handout on low-fat options  Call if blood sugars are consistently high after meals which she will do at least once a day either after breakfast or supper  To call if he has any low sugars  Change the carbohydrate ratio at 1:5 for now without the day   Hypertension: Blood pressure is well-controlled  Hyperlipidemia: Triglycerides are high and will recommend low-dose fenofibrate to his PCP  Counseling time on subjects discussed above is over 50% of today's 25  minute visit   Patient Instructions  Basal rates as on printout  Bolus 10 min before meal  Check blood sugars on waking up 5-6 times a week  Also check blood sugars about 2 hours after a meal and do this after different meals by rotation  Recommended blood sugar levels on waking up is 90-130 and about 2 hours after meal is 130-160   Reduce fat intake in all meals, fatty meats    Ilham Roughton 06/17/2015,  3:53 PM   Note: This office note was prepared with Estate agent. Any transcriptional errors that result from this process are unintentional.

## 2015-06-18 ENCOUNTER — Ambulatory Visit (HOSPITAL_COMMUNITY): Payer: Medicare Other | Attending: Cardiovascular Disease

## 2015-06-18 ENCOUNTER — Other Ambulatory Visit: Payer: Self-pay

## 2015-06-18 DIAGNOSIS — Z6834 Body mass index (BMI) 34.0-34.9, adult: Secondary | ICD-10-CM | POA: Insufficient documentation

## 2015-06-18 DIAGNOSIS — I422 Other hypertrophic cardiomyopathy: Secondary | ICD-10-CM | POA: Insufficient documentation

## 2015-06-18 DIAGNOSIS — I071 Rheumatic tricuspid insufficiency: Secondary | ICD-10-CM | POA: Diagnosis not present

## 2015-06-18 DIAGNOSIS — E785 Hyperlipidemia, unspecified: Secondary | ICD-10-CM | POA: Diagnosis not present

## 2015-06-18 DIAGNOSIS — I517 Cardiomegaly: Secondary | ICD-10-CM | POA: Diagnosis not present

## 2015-06-18 DIAGNOSIS — I352 Nonrheumatic aortic (valve) stenosis with insufficiency: Secondary | ICD-10-CM | POA: Insufficient documentation

## 2015-06-18 DIAGNOSIS — I77810 Thoracic aortic ectasia: Secondary | ICD-10-CM | POA: Diagnosis not present

## 2015-06-18 DIAGNOSIS — I340 Nonrheumatic mitral (valve) insufficiency: Secondary | ICD-10-CM | POA: Insufficient documentation

## 2015-06-18 DIAGNOSIS — I5189 Other ill-defined heart diseases: Secondary | ICD-10-CM | POA: Diagnosis not present

## 2015-06-18 DIAGNOSIS — F17200 Nicotine dependence, unspecified, uncomplicated: Secondary | ICD-10-CM | POA: Insufficient documentation

## 2015-06-18 DIAGNOSIS — E119 Type 2 diabetes mellitus without complications: Secondary | ICD-10-CM | POA: Insufficient documentation

## 2015-06-18 DIAGNOSIS — I421 Obstructive hypertrophic cardiomyopathy: Secondary | ICD-10-CM | POA: Diagnosis present

## 2015-06-18 DIAGNOSIS — I10 Essential (primary) hypertension: Secondary | ICD-10-CM | POA: Insufficient documentation

## 2015-06-19 DIAGNOSIS — I70293 Other atherosclerosis of native arteries of extremities, bilateral legs: Secondary | ICD-10-CM | POA: Diagnosis not present

## 2015-06-19 DIAGNOSIS — L97512 Non-pressure chronic ulcer of other part of right foot with fat layer exposed: Secondary | ICD-10-CM | POA: Diagnosis not present

## 2015-06-19 DIAGNOSIS — E1151 Type 2 diabetes mellitus with diabetic peripheral angiopathy without gangrene: Secondary | ICD-10-CM | POA: Diagnosis not present

## 2015-06-25 ENCOUNTER — Ambulatory Visit (INDEPENDENT_AMBULATORY_CARE_PROVIDER_SITE_OTHER): Payer: Medicare Other | Admitting: Internal Medicine

## 2015-06-25 ENCOUNTER — Encounter: Payer: Self-pay | Admitting: Internal Medicine

## 2015-06-25 VITALS — BP 128/80 | HR 83 | Ht 76.0 in | Wt 282.8 lb

## 2015-06-25 DIAGNOSIS — I2510 Atherosclerotic heart disease of native coronary artery without angina pectoris: Secondary | ICD-10-CM

## 2015-06-25 DIAGNOSIS — G4733 Obstructive sleep apnea (adult) (pediatric): Secondary | ICD-10-CM

## 2015-06-25 DIAGNOSIS — Z9989 Dependence on other enabling machines and devices: Secondary | ICD-10-CM

## 2015-06-25 NOTE — Patient Instructions (Signed)
Order- schedule unassigned Home Sleep Test    Dx OSA  Assuming the sleep study confirms the diagnosis, we will be able to get you established with a local DME  Please call as needed

## 2015-06-25 NOTE — Progress Notes (Signed)
06/25/2015-75 year old male former (2 packs per day 30 years quit 1985) smoker referred courtesy of Terri Piedra, NP; CPAP management-been in [facility] for about 2 years-needs to establish DME here. Sleep Study done about 20 years ago. Complicating medical history including HBP, morbid obesity, DM 2, pacemaker/sinus node arrhythmia He has had only 2 machines in 20 years. Current mask and supplies are at least 75 years old. Quality of life definitely better with CPAP. No ENT surgery. Has pacemaker/no MI. No lung disease known but former smoker.  Prior to Admission medications   Medication Sig Start Date End Date Taking? Authorizing Provider  acetaminophen (TYLENOL) 500 MG tablet Take 1,000 mg by mouth every 6 (six) hours as needed for fever. Reported on 06/17/2015   Yes Historical Provider, MD  aspirin EC 325 MG tablet Take 1 tablet (325 mg total) by mouth daily. 11/06/14  Yes Corky Sing, PA-C  calcitRIOL (ROCALTROL) 0.25 MCG capsule Take 0.25 mcg by mouth daily.  05/30/14  Yes Historical Provider, MD  carvedilol (COREG) 25 MG tablet Take 1 tablet (25 mg total) by mouth 2 (two) times daily with a meal. 03/24/15  Yes [name], FNP  doxazosin (CARDURA) 4 MG tablet Take 1 tablet (4 mg total) by mouth daily. 11/04/14  Yes [name], FNP  doxycycline (VIBRA-TABS) 100 MG tablet Take 1 tablet (100 mg total) by mouth 2 (two) times daily. 06/08/15  Yes Campbell Riches, MD  insulin aspart (NOVOLOG) 100 UNIT/ML injection Use max 120 units per day with CONTINUOUS  insulin pump. DX CODE E11.49 06/09/15  Yes Elayne Snare, MD  isosorbide mononitrate (IMDUR) 30 MG 24 hr tablet TAKE TWO TABLETS BY MOUTH ONCE DAILY 12/24/13  Yes [name], MD  levofloxacin (LEVAQUIN) 750 MG tablet Take 1 tablet (750 mg total) by mouth daily. 06/08/15  Yes Campbell Riches, MD  Liraglutide (VICTOZA) 18 MG/3ML SOPN Inject 0.3 mLs (1.8 mg total) into the skin daily. 05/28/15  Yes Elayne Snare, MD  Omega-3 Fatty Acids (FISH OIL)  1000 MG CAPS Take by mouth 2 (two) times daily.   Yes Historical Provider, MD  pantoprazole (PROTONIX) 40 MG tablet Take 1 tablet (40 mg total) by mouth daily. 08/04/14  Yes [name], FNP  sertraline (ZOLOFT) 50 MG tablet Take 1 tablet (50 mg total) by mouth daily. 05/25/15  Yes Aleksei Plotnikov V, MD  torsemide (DEMADEX) 20 MG tablet Take 2 tablets (40 mg total) by mouth daily. 01/26/15  Yes [name], FNP   Past Medical History  Diagnosis Date  . Hypertension   . Arthritis   . Anxiety   . Sinus node dysfunction (HCC)   . Cardiac pacemaker in situ   . CHF (congestive heart failure) ([place])   . CAD (coronary artery disease)     Nonobstructive CAD per cath  . Dyspnea   . Insulin dependent type 2 diabetes mellitus ([place]) 1991    followd by [name]--  has insulin pump  . Insulin pump in place   . Anemia, iron deficiency   . Secondary hyperparathyroidism of renal origin ([place])   . 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 History of skin cancer   . Chronic ulcer of right foot ([place])   . History of cellulitis     right great toe 10-25-2014  . BPH (benign prostatic hypertrophy)   . Peripheral neuropathy (HCC)     severe  .  Peripheral vascular disease ([place])     bilateral lower extremities  . OSA on CPAP    Past Surgical History  Procedure Laterality Date  . Vein ligation and stripping    . 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 Orif ankle fracture Left 11/06/2014    Procedure: OPEN REDUCTION INTERNAL FIXATION (ORIF) LEFT  ANKLE FRACTURE;  Surgeon: [name], MD;  Location: [facility];  Service: Orthopedics;  Laterality: Left;  . Cardiac catheterization  11-25-2010   [place], [place]    Nonobstructive CAD  . Cardiac pacemaker placement  Nov 2009    [employer]  . Total knee arthroplasty    . Excision bone cyst Right 03/06/2015    Procedure: BONE BIOPSIES OF RIGHT  FOOT;  Surgeon: [name], MD;  Location: [facility];  Service: Podiatry;  Laterality: Right;  . Amputation toe Right 03/12/2015    Procedure: RIGHT HALLUS AMPUTATION ;  Surgeon: [name], MD;  Location: [facility];  Service: Podiatry;  Laterality: Right;  . Ep implantable device N/A 06/03/2015    Procedure: PPM Generator Changeout;  Surgeon: [name], MD;  Location: [facility];  Service: Cardiovascular;  Laterality: N/A;   Family History  Problem Relation Age of Onset  . Cancer Mother     breast  . Heart attack Father    Social History   Social History  . Marital Status: Married    Spouse Name: N/A  . Number of Children: 3  . Years of Education: 12   Occupational History  . Retired    Social History Main Topics  . Smoking status: Former Smoker -- 2.00 packs/day for 30 years    Quit date: 03/03/1984  . Smokeless tobacco: Never Used  . Alcohol Use: 0.6 oz/week    1 Glasses of wine per week     Comment: social  . Drug Use: No  . Sexual Activity: Not on file   Other Topics Concern  . Not on file   Social History Narrative   Currently resides with his wife. 1 dog. Fun: Golf   Denies any religious beliefs effecting health care.    ROS-see HPI   Negative unless "+" Constitutional:    weight loss, night sweats, fevers, chills, fatigue, lassitude. HEENT:    headaches, difficulty swallowing, tooth/dental problems, sore throat,       sneezing, itching, ear ache, nasal congestion, post nasal drip, snoring CV:    chest pain, orthopnea, PND, swelling in lower extremities, anasarca,                                                 dizziness, palpitations Resp:   shortness of breath with exertion or at rest.                productive cough,   non-productive cough, coughing up of blood.              change in color of mucus.  wheezing.   Skin:    rash or lesions. GI:  No-   heartburn, indigestion, abdominal pain, nausea, vomiting,  diarrhea,                 change in bowel habits, loss of appetite GU: dysuria, change in color of urine,  no urgency or frequency.   flank pain. MS:   joint pain, stiffness, decreased range of motion, back pain. Neuro-     nothing unusual Psych:  change in mood or affect.  depression or anxiety.   memory loss.  OBJ- Physical Exam General- Alert, Oriented, Affect-appropriate, Distress- none acute, + obese Skin- rash-none, lesions- none, excoriation- none Lymphadenopathy- none Head- atraumatic            Eyes- Gross vision intact, PERRLA, conjunctivae and secretions clear            Ears- Hearing, canals-normal            Nose- Clear, no-Septal dev, mucus, polyps, erosion, perforation             Throat- Mallampati IV , mucosa clear , drainage- none, tonsils- atrophic Neck- flexible , trachea midline, no stridor , thyroid nl, carotid no bruit Chest - symmetrical excursion , unlabored           Heart/CV- RRR , no murmur , no gallop  , no rub, nl s1 s2                           - JVD- none , edema- none, stasis changes- none, varices- none           Lung- clear to P&A, wheeze- none, cough- none , dullness-none, rub- none           Chest wall- L pacemaker Abd-  Br/ Gen/ Rectal- Not done, not indicated Extrem- cyanosis- none, clubbing, none, atrophy- none, strength- nl Neuro- grossly intact to observation

## 2015-06-25 NOTE — Assessment & Plan Note (Signed)
He has been compliant with CPAP for 20 years and definitely feels that it benefits him. We need to update documentation, establish him with a DME company, and get replacement supplies

## 2015-06-25 NOTE — Assessment & Plan Note (Signed)
Educated on the impact of body weight on obstructive sleep apnea with encouragement to lose weight.

## 2015-06-26 DIAGNOSIS — I70293 Other atherosclerosis of native arteries of extremities, bilateral legs: Secondary | ICD-10-CM | POA: Diagnosis not present

## 2015-06-26 DIAGNOSIS — L97512 Non-pressure chronic ulcer of other part of right foot with fat layer exposed: Secondary | ICD-10-CM | POA: Diagnosis not present

## 2015-06-26 DIAGNOSIS — E1151 Type 2 diabetes mellitus with diabetic peripheral angiopathy without gangrene: Secondary | ICD-10-CM | POA: Diagnosis not present

## 2015-06-29 DIAGNOSIS — G4733 Obstructive sleep apnea (adult) (pediatric): Secondary | ICD-10-CM | POA: Diagnosis not present

## 2015-07-02 DIAGNOSIS — E1151 Type 2 diabetes mellitus with diabetic peripheral angiopathy without gangrene: Secondary | ICD-10-CM | POA: Diagnosis not present

## 2015-07-02 DIAGNOSIS — L97512 Non-pressure chronic ulcer of other part of right foot with fat layer exposed: Secondary | ICD-10-CM | POA: Diagnosis not present

## 2015-07-02 DIAGNOSIS — I70293 Other atherosclerosis of native arteries of extremities, bilateral legs: Secondary | ICD-10-CM | POA: Diagnosis not present

## 2015-07-03 DIAGNOSIS — G4733 Obstructive sleep apnea (adult) (pediatric): Secondary | ICD-10-CM | POA: Diagnosis not present

## 2015-07-04 ENCOUNTER — Other Ambulatory Visit: Payer: Self-pay | Admitting: Internal Medicine

## 2015-07-05 ENCOUNTER — Emergency Department (HOSPITAL_BASED_OUTPATIENT_CLINIC_OR_DEPARTMENT_OTHER)
Admission: EM | Admit: 2015-07-05 | Discharge: 2015-07-05 | Disposition: A | Discharge status: Home / Self Care | Payer: Medicare Other | Attending: Emergency Medicine | Admitting: Emergency Medicine

## 2015-07-05 DIAGNOSIS — I2510 Atherosclerotic heart disease of native coronary artery without angina pectoris: Secondary | ICD-10-CM | POA: Insufficient documentation

## 2015-07-05 DIAGNOSIS — R300 Dysuria: Secondary | ICD-10-CM | POA: Insufficient documentation

## 2015-07-05 DIAGNOSIS — Z85828 Personal history of other malignant neoplasm of skin: Secondary | ICD-10-CM | POA: Diagnosis not present

## 2015-07-05 DIAGNOSIS — G4733 Obstructive sleep apnea (adult) (pediatric): Secondary | ICD-10-CM | POA: Diagnosis not present

## 2015-07-05 DIAGNOSIS — Z79899 Other long term (current) drug therapy: Secondary | ICD-10-CM | POA: Insufficient documentation

## 2015-07-05 DIAGNOSIS — E119 Type 2 diabetes mellitus without complications: Secondary | ICD-10-CM | POA: Diagnosis not present

## 2015-07-05 DIAGNOSIS — L560 Drug phototoxic response: Secondary | ICD-10-CM | POA: Diagnosis not present

## 2015-07-05 DIAGNOSIS — Z872 Personal history of diseases of the skin and subcutaneous tissue: Secondary | ICD-10-CM | POA: Diagnosis not present

## 2015-07-05 DIAGNOSIS — L568 Other specified acute skin changes due to ultraviolet radiation: Secondary | ICD-10-CM

## 2015-07-05 DIAGNOSIS — Z87891 Personal history of nicotine dependence: Secondary | ICD-10-CM | POA: Diagnosis not present

## 2015-07-05 DIAGNOSIS — H53149 Visual discomfort, unspecified: Secondary | ICD-10-CM | POA: Diagnosis not present

## 2015-07-05 DIAGNOSIS — Z794 Long term (current) use of insulin: Secondary | ICD-10-CM | POA: Diagnosis not present

## 2015-07-05 DIAGNOSIS — T368X5A Adverse effect of other systemic antibiotics, initial encounter: Secondary | ICD-10-CM | POA: Insufficient documentation

## 2015-07-05 DIAGNOSIS — Z7982 Long term (current) use of aspirin: Secondary | ICD-10-CM | POA: Diagnosis not present

## 2015-07-05 DIAGNOSIS — I509 Heart failure, unspecified: Secondary | ICD-10-CM | POA: Diagnosis not present

## 2015-07-05 DIAGNOSIS — K1379 Other lesions of oral mucosa: Secondary | ICD-10-CM | POA: Insufficient documentation

## 2015-07-05 DIAGNOSIS — I129 Hypertensive chronic kidney disease with stage 1 through stage 4 chronic kidney disease, or unspecified chronic kidney disease: Secondary | ICD-10-CM | POA: Insufficient documentation

## 2015-07-05 DIAGNOSIS — T50905A Adverse effect of unspecified drugs, medicaments and biological substances, initial encounter: Secondary | ICD-10-CM

## 2015-07-05 DIAGNOSIS — Z950 Presence of cardiac pacemaker: Secondary | ICD-10-CM | POA: Diagnosis not present

## 2015-07-05 DIAGNOSIS — T364X1A Poisoning by tetracyclines, accidental (unintentional), initial encounter: Secondary | ICD-10-CM | POA: Diagnosis not present

## 2015-07-05 DIAGNOSIS — L550 Sunburn of first degree: Secondary | ICD-10-CM | POA: Diagnosis not present

## 2015-07-05 DIAGNOSIS — W899XXA Exposure to unspecified man-made visible and ultraviolet light, initial encounter: Secondary | ICD-10-CM | POA: Insufficient documentation

## 2015-07-05 DIAGNOSIS — N183 Chronic kidney disease, stage 3 (moderate): Secondary | ICD-10-CM | POA: Diagnosis not present

## 2015-07-05 DIAGNOSIS — R21 Rash and other nonspecific skin eruption: Secondary | ICD-10-CM | POA: Diagnosis present

## 2015-07-05 DIAGNOSIS — T364X5A Adverse effect of tetracyclines, initial encounter: Secondary | ICD-10-CM | POA: Diagnosis not present

## 2015-07-05 LAB — URINALYSIS, ROUTINE W REFLEX MICROSCOPIC
Bilirubin Urine: NEGATIVE
Glucose, UA: 250 mg/dL — AB
Hgb urine dipstick: NEGATIVE
Ketones, ur: NEGATIVE mg/dL
Leukocytes, UA: NEGATIVE
Nitrite: NEGATIVE
Protein, ur: 30 mg/dL — AB
Specific Gravity, Urine: 1.017 (ref 1.005–1.030)
pH: 5 (ref 5.0–8.0)

## 2015-07-05 LAB — URINE MICROSCOPIC-ADD ON
RBC / HPF: NONE SEEN RBC/hpf (ref 0–5)
WBC, UA: NONE SEEN WBC/hpf (ref 0–5)

## 2015-07-05 MED ORDER — HYDROCORTISONE 1 % EX CREA: TOPICAL_CREAM | CUTANEOUS | Status: DC

## 2015-07-05 NOTE — ED Notes (Signed)
Pt reports redness to face and arms x 3 days. Pt has been on long term antibiotic therapy

## 2015-07-05 NOTE — ED Notes (Signed)
Pt reports spot on lip x 1 year- began bleeding 1 week ago and now has spread. Also reports bladder pain and dark urine. Reports redness to arms. Pt is currently taking levaquin and doxycycline

## 2015-07-05 NOTE — ED Provider Notes (Signed)
CSN: [id number]     Arrival date & time 07/05/15  1158 History   First MD Initiated Contact with Patient 07/05/15 1236     Chief Complaint  Patient presents with  . Mouth Lesions  . Dysuria     (Consider location/radiation/quality/duration/timing/severity/associated sxs/prior Treatment) Patient is a 75 y.o. male presenting with rash. The history is provided by the patient.  Rash Location:  Face and shoulder/arm Facial rash location:  Lip and chin Shoulder/arm rash location:  L hand and R hand Quality: painful, redness and swelling (of lower lip with crusting )   Pain details:    Quality:  Burning   Severity:  Mild   Onset quality:  Gradual   Duration:  2 days   Timing:  Constant   Progression:  Unchanged (Lip has worsened acutely over same period of time) Severity:  Moderate Onset quality:  Gradual Duration:  3 days Timing:  Constant Progression:  Spreading Chronicity:  New Context: medications (doxycycline and Levaquin) and sun exposure   Relieved by:  Nothing Worsened by:  Nothing tried Ineffective treatments:  None tried Associated symptoms: no fever     Past Medical History  Diagnosis Date  . Hypertension   . Arthritis   . Anxiety   . Sinus node dysfunction (HCC)   . Cardiac pacemaker in situ   . CHF (congestive heart failure) ([place])   . CAD (coronary artery disease)     Nonobstructive CAD per cath  . Dyspnea   . Insulin dependent type 2 diabetes mellitus ([place]) 1991    followd by [name]--  has insulin pump  . Insulin pump in place   . Anemia, iron deficiency   . Secondary hyperparathyroidism of renal origin ([place])   . 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 History of skin cancer   . Chronic ulcer of right foot ([place])   . History of cellulitis     right great toe 10-25-2014  . BPH (benign prostatic hypertrophy)   . Peripheral neuropathy (HCC)     severe  . Peripheral vascular disease ([place])    bilateral lower extremities  . OSA on CPAP    Past Surgical History  Procedure Laterality Date  . Vein ligation and stripping    . 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 Orif ankle fracture Left 11/06/2014    Procedure: OPEN REDUCTION INTERNAL FIXATION (ORIF) LEFT  ANKLE FRACTURE;  Surgeon: [name], MD;  Location: [facility];  Service: Orthopedics;  Laterality: Left;  . Cardiac catheterization  11-25-2010   [place], [place]    Nonobstructive CAD  . Cardiac pacemaker placement  Nov 2009    [employer]  . Total knee arthroplasty    . Excision bone cyst Right 03/06/2015    Procedure: BONE BIOPSIES OF RIGHT FOOT;  Surgeon: [name], MD;  Location: [facility];  Service: Podiatry;  Laterality: Right;  . Amputation toe Right 03/12/2015    Procedure: RIGHT HALLUS AMPUTATION ;  Surgeon: [name], MD;  Location: [facility];  Service: Podiatry;  Laterality: Right;  . Ep implantable device N/A 06/03/2015    Procedure: PPM Generator Changeout;  Surgeon: [name], MD;  Location: [facility];  Service: Cardiovascular;  Laterality: N/A;   Family History  Problem Relation Age of Onset  . Cancer Mother     breast  .  Heart attack Father    Social History  Substance Use Topics  . Smoking status: Former Smoker -- 2.00 packs/day for 30 years    Quit date: 03/03/1984  . Smokeless tobacco: Never Used  . Alcohol Use: 0.6 oz/week    1 Glasses of wine per week     Comment: social    Review of Systems  Constitutional: Negative for fever.  Skin: Positive for rash.  All other systems reviewed and are negative.     Allergies  Review of patient's allergies indicates no known allergies.  Home Medications   Prior to Admission medications   Medication Sig Start Date End Date Taking? Authorizing Provider  acetaminophen (TYLENOL) 500 MG tablet Take 1,000 mg by mouth every 6 (six) hours as needed for  fever. Reported on 06/17/2015    Historical Provider, MD  aspirin EC 325 MG tablet Take 1 tablet (325 mg total) by mouth daily. 11/06/14   Corky Sing, PA-C  calcitRIOL (ROCALTROL) 0.25 MCG capsule Take 0.25 mcg by mouth daily.  05/30/14   Historical Provider, MD  carvedilol (COREG) 25 MG tablet Take 1 tablet (25 mg total) by mouth 2 (two) times daily with a meal. 03/24/15   [name], FNP  doxazosin (CARDURA) 4 MG tablet Take 1 tablet (4 mg total) by mouth daily. 11/04/14   [name], FNP  doxycycline (VIBRA-TABS) 100 MG tablet Take 1 tablet (100 mg total) by mouth 2 (two) times daily. 06/08/15   Campbell Riches, MD  insulin aspart (NOVOLOG) 100 UNIT/ML injection Use max 120 units per day with CONTINUOUS  insulin pump. DX CODE E11.49 06/09/15   Elayne Snare, MD  isosorbide mononitrate (IMDUR) 30 MG 24 hr tablet TAKE TWO TABLETS BY MOUTH ONCE DAILY 12/24/13   [name], MD  levofloxacin (LEVAQUIN) 750 MG tablet Take 1 tablet (750 mg total) by mouth daily. 06/08/15   Campbell Riches, MD  Liraglutide (VICTOZA) 18 MG/3ML SOPN Inject 0.3 mLs (1.8 mg total) into the skin daily. 05/28/15   Elayne Snare, MD  Omega-3 Fatty Acids (FISH OIL) 1000 MG CAPS Take by mouth 2 (two) times daily.    Historical Provider, MD  pantoprazole (PROTONIX) 40 MG tablet Take 1 tablet (40 mg total) by mouth daily. 08/04/14   [name], FNP  sertraline (ZOLOFT) 50 MG tablet Take 1 tablet (50 mg total) by mouth daily. 05/25/15   Aleksei Plotnikov V, MD  torsemide (DEMADEX) 20 MG tablet Take 2 tablets (40 mg total) by mouth daily. 01/26/15   [name], FNP   BP 161/45 mmHg  Pulse 62  Temp(Src) 98.4 F (36.9 C) (Oral)  Resp 20  Ht 6\' 4"  (1.93 m)  Wt 282 lb (127.914 kg)  BMI 34.34 kg/m2  SpO2 98% Physical Exam  Constitutional: He is oriented to person, place, and time. He appears well-developed and well-nourished. No distress.  HENT:  Head: Normocephalic and atraumatic.  Eyes: Conjunctivae are  normal.  Neck: Neck supple. No tracheal deviation present.  Cardiovascular: Normal rate and regular rhythm.   Pulmonary/Chest: Effort normal. No respiratory distress.  Abdominal: Soft. He exhibits no distension.  Neurological: He is alert and oriented to person, place, and time.  Skin: Skin is warm and dry. Burn (Diffuse first-degree on lower half of face with swelling and crusting of lower lip. Dorsum of bilateral hands affected with mild erythema, sparing of the interdigital webspaces, palms, forearms) noted.     Psychiatric: He has a normal  mood and affect.    ED Course  Procedures (including critical care time) Labs Review Labs Reviewed  URINALYSIS, ROUTINE W REFLEX MICROSCOPIC (NOT AT [facility]) - Abnormal; Notable for the following:    Glucose, UA 250 (*)    Protein, ur 30 (*)    All other components within normal limits  URINE MICROSCOPIC-ADD ON - Abnormal; Notable for the following:    Squamous Epithelial / LPF 0-5 (*)    Bacteria, UA RARE (*)    All other components within normal limits    Imaging Review No results found. I have personally reviewed and evaluated these images and lab results as part of my medical decision-making.   EKG Interpretation None      MDM   Final diagnoses:  Drug-induced photosensitivity  Phototoxic drug eruption    75 y.o. male presents with red rash appearing over the dorsum of his hands sparing the webspaces, nose, bottom lip with crusting and lower half of face. 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 He spent some time in the sun yesterday but not a prolonged period. He was wearing a hat which would explain his distribution. He was also wearing long sleeves and his forearms and upper arms are spared. Clinical picture is consistent with doxycycline induced phototoxicity. Provided topical hydrocortisone for lesions on the face and recommended aggressive use of sunscreen and avoidance of any  unnecessary exposure to UV rays. If rash and lower lip are not responding to supportive therapy and topical steroids, the patient contact his infectious disease doctor about potential stopping this medication due to unintended side effect.    Leo Grosser, MD 07/05/15 1534

## 2015-07-05 NOTE — Discharge Instructions (Signed)
Doxycycline tablets or capsules What is this medicine? DOXYCYCLINE (dox i SYE kleen) is a tetracycline antibiotic. It kills certain bacteria or stops their growth. It is used to treat many kinds of infections, like dental, skin, respiratory, and urinary tract infections. It also treats acne, Lyme disease, malaria, and certain sexually transmitted infections. This medicine may be used for other purposes; ask your health care provider or pharmacist if you have questions. What should I tell my health care provider before I take this medicine? They need to know if you have any of these conditions: -liver disease -long exposure to sunlight like working outdoors -stomach problems like colitis -an unusual or allergic reaction to doxycycline, tetracycline antibiotics, other medicines, foods, dyes, or preservatives -pregnant or trying to get pregnant -breast-feeding How should I use this medicine? Take this medicine by mouth with a full glass of water. Follow the directions on the prescription label. It is best to take this medicine without food, but if it upsets your stomach take it with food. Take your medicine at regular intervals. Do not take your medicine more often than directed. Take all of your medicine as directed even if you think you are better. Do not skip doses or stop your medicine early. Talk to your pediatrician regarding the use of this medicine in children. While this drug may be prescribed for selected conditions, precautions do apply. Overdosage: If you think you have taken too much of this medicine contact a poison control center or emergency room at once. NOTE: This medicine is only for you. Do not share this medicine with others. What if I miss a dose? If you miss a dose, take it as soon as you can. If it is almost time for your next dose, take only that dose. Do not take double or extra doses. What may interact with this medicine? -antacids -barbiturates -birth control  pills -bismuth subsalicylate -carbamazepine -methoxyflurane -other antibiotics -phenytoin -vitamins that contain iron -warfarin This list may not describe all possible interactions. Give your health care provider a list of all the medicines, herbs, non-prescription drugs, or dietary supplements you use. Also tell them if you smoke, drink alcohol, or use illegal drugs. Some items may interact with your medicine. What should I watch for while using this medicine? Tell your doctor or health care professional if your symptoms do not improve. Do not treat diarrhea with over the counter products. Contact your doctor if you have diarrhea that lasts more than 2 days or if it is severe and watery. Do not take this medicine just before going to bed. It may not dissolve properly when you lay down and can cause pain in your throat. Drink plenty of fluids while taking this medicine to also help reduce irritation in your throat. This medicine can make you more sensitive to the sun. Keep out of the sun. If you cannot avoid being in the sun, wear protective clothing and use sunscreen. Do not use sun lamps or tanning beds/booths. Birth control pills may not work properly while you are taking this medicine. Talk to your doctor about using an extra method of birth control. If you are being treated for a sexually transmitted infection, avoid sexual contact until you have finished your treatment. Your sexual partner may also need treatment. Avoid antacids, aluminum, calcium, magnesium, and iron products for 4 hours before and 2 hours after taking a dose of this medicine. If you are using this medicine to prevent malaria, you should still protect yourself from contact  with mosquitos. Stay in screened-in areas, use mosquito nets, keep your body covered, and use an insect repellent. What side effects may I notice from receiving this medicine? Side effects that you should report to your doctor or health care professional  as soon as possible: -allergic reactions like skin rash, itching or hives, swelling of the face, lips, or tongue -difficulty breathing -fever -itching in the rectal or genital area -pain on swallowing -redness, blistering, peeling or loosening of the skin, including inside the mouth -severe stomach pain or cramps -unusual bleeding or bruising -unusually weak or tired -yellowing of the eyes or skin Side effects that usually do not require medical attention (report to your doctor or health care professional if they continue or are bothersome): -diarrhea -loss of appetite -nausea, vomiting This list may not describe all possible side effects. Call your doctor for medical advice about side effects. You may report side effects to FDA at 1-800-FDA-1088. Where should I keep my medicine? Keep out of the reach of children. Store at room temperature, below 30 degrees C (86 degrees F). Protect from light. Keep container tightly closed. Throw away any unused medicine after the expiration date. Taking this medicine after the expiration date can make you seriously ill. NOTE: This sheet is a summary. It may not cover all possible information. If you have questions about this medicine, talk to your doctor, pharmacist, or health care provider.    2016, Elsevier/Gold Standard. (2014-08-22 12:10:28) Sunburn Sunburn is damage to the skin caused by overexposure to ultraviolet (UV) rays. People with light skin or a fair complexion may be more susceptible to sunburn. Repeated sun exposure causes early skin aging such as wrinkles and sun spots. It also increases the risk of skin cancer. CAUSES A sunburn is caused by getting too much UV radiation from the sun. SYMPTOMS  Red or pink skin.  Soreness and swelling.  Pain.  Blisters.  Peeling skin.  Headache, fever, and fatigue if sunburn covers a large area. TREATMENT  Your caregiver may tell you to take certain medicines to lessen inflammation.  Your  caregiver may have you use hydrocortisone cream or spray to help with itching and inflammation.  Your caregiver may prescribe an antibiotic cream to use on blisters. HOME CARE INSTRUCTIONS   Avoid further exposure to the sun.  Cool baths and cool compresses may be helpful if used several times per day. Do not apply ice, since this may result in more damage to the skin.  Only take over-the-counter or prescription medicines for pain, discomfort, or fever as directed by your caregiver.  Use aloe or other over-the-counter sunburn creams or gels on your skin. Do not apply these creams or gels on blisters.  Drink enough fluids to keep your urine clear or pale yellow.  Do not break blisters. If blisters break, your caregiver may recommend an antibiotic cream to apply to the affected area. PREVENTION   Try to avoid the sun between 10:00 a.m. and 4:00 p.m. when it is the strongest.  Apply sunscreen at least 30 minutes before exposure to the sun.  Always wear protective hats, clothing, and sunglasses with UV protection.  Avoid medicines, herbs, and foods that increase your sensitivity to sunlight.  Avoid tanning beds. SEEK IMMEDIATE MEDICAL CARE IF:   You have a fever.  Your pain is uncontrolled with medicine.  You start to vomit or have diarrhea.  You feel faint or develop a headache with confusion.  You develop severe blistering.  You have a  pus-like (purulent) discharge coming from the blisters.  Your burn becomes more painful and swollen. MAKE SURE YOU:  Understand these instructions.  Will watch your condition.  Will get help right away if you are not doing well or get worse.   This information is not intended to replace advice given to you by your health care provider. Make sure you discuss any questions you have with your health care provider.   Document Released: 02/09/2005 Document Revised: 08/27/2012 Document Reviewed: 11/03/2014 Elsevier Interactive Patient  Education [employer].

## 2015-07-06 ENCOUNTER — Other Ambulatory Visit: Payer: Self-pay | Admitting: *Deleted

## 2015-07-06 DIAGNOSIS — G4733 Obstructive sleep apnea (adult) (pediatric): Secondary | ICD-10-CM

## 2015-07-10 DIAGNOSIS — I70293 Other atherosclerosis of native arteries of extremities, bilateral legs: Secondary | ICD-10-CM | POA: Diagnosis not present

## 2015-07-10 DIAGNOSIS — L97512 Non-pressure chronic ulcer of other part of right foot with fat layer exposed: Secondary | ICD-10-CM | POA: Diagnosis not present

## 2015-07-10 DIAGNOSIS — E1151 Type 2 diabetes mellitus with diabetic peripheral angiopathy without gangrene: Secondary | ICD-10-CM | POA: Diagnosis not present

## 2015-07-15 DIAGNOSIS — I129 Hypertensive chronic kidney disease with stage 1 through stage 4 chronic kidney disease, or unspecified chronic kidney disease: Secondary | ICD-10-CM | POA: Diagnosis not present

## 2015-07-15 DIAGNOSIS — E1129 Type 2 diabetes mellitus with other diabetic kidney complication: Secondary | ICD-10-CM | POA: Diagnosis not present

## 2015-07-15 DIAGNOSIS — N183 Chronic kidney disease, stage 3 (moderate): Secondary | ICD-10-CM | POA: Diagnosis not present

## 2015-07-15 DIAGNOSIS — M869 Osteomyelitis, unspecified: Secondary | ICD-10-CM | POA: Diagnosis not present

## 2015-07-15 DIAGNOSIS — N2581 Secondary hyperparathyroidism of renal origin: Secondary | ICD-10-CM | POA: Diagnosis not present

## 2015-07-15 DIAGNOSIS — D509 Iron deficiency anemia, unspecified: Secondary | ICD-10-CM | POA: Diagnosis not present

## 2015-07-15 DIAGNOSIS — E875 Hyperkalemia: Secondary | ICD-10-CM | POA: Diagnosis not present

## 2015-07-15 DIAGNOSIS — R809 Proteinuria, unspecified: Secondary | ICD-10-CM | POA: Diagnosis not present

## 2015-07-17 ENCOUNTER — Encounter: Payer: Self-pay | Admitting: Internal Medicine

## 2015-07-17 ENCOUNTER — Ambulatory Visit (INDEPENDENT_AMBULATORY_CARE_PROVIDER_SITE_OTHER): Payer: Medicare Other | Admitting: Internal Medicine

## 2015-07-17 VITALS — BP 138/54 | HR 78 | Ht 76.0 in | Wt 279.0 lb

## 2015-07-17 DIAGNOSIS — I2510 Atherosclerotic heart disease of native coronary artery without angina pectoris: Secondary | ICD-10-CM

## 2015-07-17 DIAGNOSIS — G4733 Obstructive sleep apnea (adult) (pediatric): Secondary | ICD-10-CM

## 2015-07-17 DIAGNOSIS — Z9989 Dependence on other enabling machines and devices: Secondary | ICD-10-CM

## 2015-07-17 NOTE — Progress Notes (Signed)
06/25/2015-75 year old male former (2 packs per day 30 years quit 1985) smoker referred courtesy of Terri Piedra, NP; CPAP management-been in [place] for about 2 years-needs to establish DME here. Sleep Study done about 20 years ago. Complicating medical history including HBP, morbid obesity, DM 2, pacemaker/sinus node arrhythmia He has had only 2 machines in 20 years. Current mask and supplies are at least 75 years old. Quality of life definitely better with CPAP. No ENT surgery. Has pacemaker/no MI. No lung disease known but former smoker.  07/17/2015-75 year old male former smoker followed for OSA, complicated by HBP, morbid obesity, DM 2, pacemaker/sinus node arrhythmia Unattended Home Sleep Test 06/29/2015-AHI 25.3/hour, desaturation to 84%, body weight 282 pounds FOLLOWS FOR: Review HST 06/29/15. He needed to renew documentation of obstructive sleep apnea so he could get a new DME company and new CPAP machine. We reviewed the steps for this process  ROS-see HPI   Negative unless "+" Constitutional:    weight loss, night sweats, fevers, chills, fatigue, lassitude. HEENT:    headaches, difficulty swallowing, tooth/dental problems, sore throat,       sneezing, itching, ear ache, nasal congestion, post nasal drip, snoring CV:    chest pain, orthopnea, PND, swelling in lower extremities, anasarca,                                                 dizziness, palpitations Resp:   shortness of breath with exertion or at rest.                productive cough,   non-productive cough, coughing up of blood.              change in color of mucus.  wheezing.   Skin:    rash or lesions. GI:  No-   heartburn, indigestion, abdominal pain, nausea, vomiting, diarrhea,                 change in bowel habits, loss of appetite GU: dysuria, change in color of urine, no urgency or frequency.   flank pain. MS:   joint pain, stiffness, decreased range of motion, back pain. Neuro-     nothing unusual Psych:  change in  mood or affect.  depression or anxiety.   memory loss.  OBJ- Physical Exam General- Alert, Oriented, Affect-appropriate, Distress- none acute, + obese Skin- rash-none, lesions- none, excoriation- none Lymphadenopathy- none Head- atraumatic            Eyes- Gross vision intact, PERRLA, conjunctivae and secretions clear            Ears- Hearing, canals-normal            Nose- Clear, no-Septal dev, mucus, polyps, erosion, perforation             Throat- Mallampati IV , mucosa clear , drainage- none, tonsils- atrophic Neck- flexible , trachea midline, no stridor , thyroid nl, carotid no bruit Chest - symmetrical excursion , unlabored           Heart/CV- RRR , no murmur , no gallop  , no rub, nl s1 s2                           - JVD- none , edema- none, stasis changes- none, varices- none  Lung- clear to P&A, wheeze- none, cough- none , dullness-none, rub- none           Chest wall- L pacemaker Abd-  Br/ Gen/ Rectal- Not done, not indicated Extrem- cyanosis- none, clubbing, none, atrophy- none, strength- nl Neuro- grossly intact to observation

## 2015-07-17 NOTE — Assessment & Plan Note (Addendum)
He has been compliant with CPAP for years and just needed to reestablish with a new machine. We discussed goals and documentation. [facility] [employer] to get new CPAP machine, auto titrate

## 2015-07-17 NOTE — Assessment & Plan Note (Signed)
Not trying to lose weight.

## 2015-07-17 NOTE — Patient Instructions (Addendum)
Order- new DME, new CPAP auto 5-20, mask of choice, humidifier, supplies, AirView     Dx OSA  Please call as needed   

## 2015-07-20 ENCOUNTER — Ambulatory Visit (INDEPENDENT_AMBULATORY_CARE_PROVIDER_SITE_OTHER): Payer: Medicare Other | Admitting: Infectious Diseases

## 2015-07-20 ENCOUNTER — Encounter: Payer: Self-pay | Admitting: Infectious Diseases

## 2015-07-20 VITALS — BP 132/75 | HR 65 | Temp 98.0°F | Ht 76.0 in | Wt 277.0 lb

## 2015-07-20 DIAGNOSIS — I2510 Atherosclerotic heart disease of native coronary artery without angina pectoris: Secondary | ICD-10-CM | POA: Diagnosis not present

## 2015-07-20 DIAGNOSIS — R42 Dizziness and giddiness: Secondary | ICD-10-CM

## 2015-07-20 DIAGNOSIS — S91109D Unspecified open wound of unspecified toe(s) without damage to nail, subsequent encounter: Secondary | ICD-10-CM

## 2015-07-20 NOTE — Assessment & Plan Note (Signed)
Will stop his doxy/levaquin.  He has improved significantly. wil lneed continued wound care.  Will see him back prn

## 2015-07-20 NOTE — Progress Notes (Signed)
   Subjective:    Patient ID: David [name], male    DOB: [birth year], 75 y.o.   MRN: [id number]  HPI 75 yo M with hx of DM2 on insulin with neuropathy, CKD3 (GFR 34), CHF/CAD/pacer, PVD and R foot ulcer > 1 year.  He was hospitalized in June 2016 and had wound Cx which grew pseudomonas.  He had cx of this wound on 10-21 that grew CNS, and diphtheroids x 2.  He underwent R great toe partial amputation on 10-27. His bone bx confirmed osteo, there is no Cx from this in EPIC or in the records that were sent with him..  Per he and his wife he has had incomplete wound healing and had "yellow, gunky" stuff from his wound.  No fever/chills. No LE erythema.  Has been on anbx (levaquin) for ~ 1 month.  He was seen in ID on 11-21 and had ABI (mod-severe bilateral), he was reffered to vascular surgery, he was continued on his po anbx.  On 12-2 he underwent CT of his R foot: Erosion of the plantar cortex of the stump of the proximal phalangeal bone of the great toe which is of concern for osteomyelitis. He was seen in ID on 12-19 and restarted on IV anbx (ceftaz/cubicin). .  CRP (1-17) 2.4.  His Ck's have been normal.  Got new pacer battery 1-18.  On 1-30 he was changed to doxy/levaquin.  Has fallen once since his last visit, felt dizzy. Per his wife he was unconscious. He refused ED visit.    Review of Systems See HPI.     Objective:   Physical Exam  Constitutional: He appears well-developed and well-nourished.  Musculoskeletal:       Legs:      Feet:      Assessment & Plan:

## 2015-07-20 NOTE — Assessment & Plan Note (Signed)
I strongly encouraged him to f/u with his PCP re: his fall. He agreed to this.

## 2015-07-22 DIAGNOSIS — E1151 Type 2 diabetes mellitus with diabetic peripheral angiopathy without gangrene: Secondary | ICD-10-CM | POA: Diagnosis not present

## 2015-07-22 DIAGNOSIS — I70293 Other atherosclerosis of native arteries of extremities, bilateral legs: Secondary | ICD-10-CM | POA: Diagnosis not present

## 2015-07-22 DIAGNOSIS — L97512 Non-pressure chronic ulcer of other part of right foot with fat layer exposed: Secondary | ICD-10-CM | POA: Diagnosis not present

## 2015-07-23 ENCOUNTER — Emergency Department (HOSPITAL_BASED_OUTPATIENT_CLINIC_OR_DEPARTMENT_OTHER): Payer: Medicare Other

## 2015-07-23 ENCOUNTER — Other Ambulatory Visit: Payer: Self-pay

## 2015-07-23 ENCOUNTER — Encounter (HOSPITAL_BASED_OUTPATIENT_CLINIC_OR_DEPARTMENT_OTHER): Payer: Self-pay | Admitting: Emergency Medicine

## 2015-07-23 ENCOUNTER — Emergency Department (HOSPITAL_BASED_OUTPATIENT_CLINIC_OR_DEPARTMENT_OTHER)
Admission: EM | Admit: 2015-07-23 | Discharge: 2015-07-23 | Disposition: A | Discharge status: Home / Self Care | Payer: Medicare Other | Attending: Emergency Medicine | Admitting: Emergency Medicine

## 2015-07-23 DIAGNOSIS — Z85828 Personal history of other malignant neoplasm of skin: Secondary | ICD-10-CM | POA: Diagnosis not present

## 2015-07-23 DIAGNOSIS — N183 Chronic kidney disease, stage 3 (moderate): Secondary | ICD-10-CM | POA: Insufficient documentation

## 2015-07-23 DIAGNOSIS — E119 Type 2 diabetes mellitus without complications: Secondary | ICD-10-CM | POA: Diagnosis not present

## 2015-07-23 DIAGNOSIS — Z7952 Long term (current) use of systemic steroids: Secondary | ICD-10-CM | POA: Insufficient documentation

## 2015-07-23 DIAGNOSIS — S2242XA Multiple fractures of ribs, left side, initial encounter for closed fracture: Secondary | ICD-10-CM | POA: Diagnosis not present

## 2015-07-23 DIAGNOSIS — G4733 Obstructive sleep apnea (adult) (pediatric): Secondary | ICD-10-CM | POA: Insufficient documentation

## 2015-07-23 DIAGNOSIS — S301XXA Contusion of abdominal wall, initial encounter: Secondary | ICD-10-CM | POA: Diagnosis not present

## 2015-07-23 DIAGNOSIS — S3013XA Contusion of flank (latus) region, initial encounter: Secondary | ICD-10-CM

## 2015-07-23 DIAGNOSIS — Z87891 Personal history of nicotine dependence: Secondary | ICD-10-CM | POA: Insufficient documentation

## 2015-07-23 DIAGNOSIS — M1990 Unspecified osteoarthritis, unspecified site: Secondary | ICD-10-CM | POA: Insufficient documentation

## 2015-07-23 DIAGNOSIS — I2510 Atherosclerotic heart disease of native coronary artery without angina pectoris: Secondary | ICD-10-CM | POA: Diagnosis not present

## 2015-07-23 DIAGNOSIS — R55 Syncope and collapse: Secondary | ICD-10-CM

## 2015-07-23 DIAGNOSIS — I509 Heart failure, unspecified: Secondary | ICD-10-CM | POA: Insufficient documentation

## 2015-07-23 DIAGNOSIS — W1839XA Other fall on same level, initial encounter: Secondary | ICD-10-CM | POA: Insufficient documentation

## 2015-07-23 DIAGNOSIS — Y9389 Activity, other specified: Secondary | ICD-10-CM | POA: Diagnosis not present

## 2015-07-23 DIAGNOSIS — Y9289 Other specified places as the place of occurrence of the external cause: Secondary | ICD-10-CM | POA: Insufficient documentation

## 2015-07-23 DIAGNOSIS — Z7982 Long term (current) use of aspirin: Secondary | ICD-10-CM | POA: Diagnosis not present

## 2015-07-23 DIAGNOSIS — Z872 Personal history of diseases of the skin and subcutaneous tissue: Secondary | ICD-10-CM | POA: Insufficient documentation

## 2015-07-23 DIAGNOSIS — Z862 Personal history of diseases of the blood and blood-forming organs and certain disorders involving the immune mechanism: Secondary | ICD-10-CM | POA: Insufficient documentation

## 2015-07-23 DIAGNOSIS — Z794 Long term (current) use of insulin: Secondary | ICD-10-CM | POA: Diagnosis not present

## 2015-07-23 DIAGNOSIS — Z950 Presence of cardiac pacemaker: Secondary | ICD-10-CM | POA: Diagnosis not present

## 2015-07-23 DIAGNOSIS — I129 Hypertensive chronic kidney disease with stage 1 through stage 4 chronic kidney disease, or unspecified chronic kidney disease: Secondary | ICD-10-CM | POA: Insufficient documentation

## 2015-07-23 DIAGNOSIS — S30811A Abrasion of abdominal wall, initial encounter: Secondary | ICD-10-CM

## 2015-07-23 DIAGNOSIS — F419 Anxiety disorder, unspecified: Secondary | ICD-10-CM | POA: Diagnosis not present

## 2015-07-23 DIAGNOSIS — Y998 Other external cause status: Secondary | ICD-10-CM | POA: Insufficient documentation

## 2015-07-23 DIAGNOSIS — Z79899 Other long term (current) drug therapy: Secondary | ICD-10-CM | POA: Insufficient documentation

## 2015-07-23 DIAGNOSIS — S3991XA Unspecified injury of abdomen, initial encounter: Secondary | ICD-10-CM | POA: Diagnosis not present

## 2015-07-23 DIAGNOSIS — G629 Polyneuropathy, unspecified: Secondary | ICD-10-CM | POA: Diagnosis not present

## 2015-07-23 DIAGNOSIS — S2232XA Fracture of one rib, left side, initial encounter for closed fracture: Secondary | ICD-10-CM

## 2015-07-23 DIAGNOSIS — S29001A Unspecified injury of muscle and tendon of front wall of thorax, initial encounter: Secondary | ICD-10-CM | POA: Diagnosis present

## 2015-07-23 DIAGNOSIS — W19XXXA Unspecified fall, initial encounter: Secondary | ICD-10-CM

## 2015-07-23 LAB — CBC WITH DIFFERENTIAL/PLATELET
Basophils Absolute: 0 10*3/uL (ref 0.0–0.1)
Basophils Relative: 0 %
Eosinophils Absolute: 0.3 10*3/uL (ref 0.0–0.7)
Eosinophils Relative: 5 %
HCT: 35.8 % — ABNORMAL LOW (ref 39.0–52.0)
Hemoglobin: 12 g/dL — ABNORMAL LOW (ref 13.0–17.0)
Lymphocytes Relative: 17 %
Lymphs Abs: 1.1 10*3/uL (ref 0.7–4.0)
MCH: 30.3 pg (ref 26.0–34.0)
MCHC: 33.5 g/dL (ref 30.0–36.0)
MCV: 90.4 fL (ref 78.0–100.0)
Monocytes Absolute: 0.6 10*3/uL (ref 0.1–1.0)
Monocytes Relative: 10 %
Neutro Abs: 4.3 10*3/uL (ref 1.7–7.7)
Neutrophils Relative %: 68 %
Platelets: 144 10*3/uL — ABNORMAL LOW (ref 150–400)
RBC: 3.96 MIL/uL — ABNORMAL LOW (ref 4.22–5.81)
RDW: 14.9 % (ref 11.5–15.5)
WBC: 6.3 10*3/uL (ref 4.0–10.5)

## 2015-07-23 LAB — COMPREHENSIVE METABOLIC PANEL
ALT: 19 U/L (ref 17–63)
AST: 23 U/L (ref 15–41)
Albumin: 3.7 g/dL (ref 3.5–5.0)
Alkaline Phosphatase: 71 U/L (ref 38–126)
Anion gap: 7 (ref 5–15)
BUN: 44 mg/dL — ABNORMAL HIGH (ref 6–20)
CO2: 24 mmol/L (ref 22–32)
Calcium: 9 mg/dL (ref 8.9–10.3)
Chloride: 106 mmol/L (ref 101–111)
Creatinine, Ser: 1.68 mg/dL — ABNORMAL HIGH (ref 0.61–1.24)
GFR calc Af Amer: 45 mL/min — ABNORMAL LOW (ref >60)
GFR calc non Af Amer: 38 mL/min — ABNORMAL LOW (ref >60)
Glucose, Bld: 271 mg/dL — ABNORMAL HIGH (ref 65–99)
Potassium: 4.8 mmol/L (ref 3.5–5.1)
Sodium: 137 mmol/L (ref 135–145)
Total Bilirubin: 0.7 mg/dL (ref 0.3–1.2)
Total Protein: 7.1 g/dL (ref 6.5–8.1)

## 2015-07-23 LAB — LIPASE, BLOOD: Lipase: 34 U/L (ref 11–51)

## 2015-07-23 MED ORDER — IOHEXOL 300 MG/ML  SOLN
80.0000 mL | Freq: Once | INTRAMUSCULAR | Status: AC | PRN
  Administered 2015-07-23: 80 mL via INTRAVENOUS

## 2015-07-23 MED ORDER — ONDANSETRON HCL 4 MG/2ML IJ SOLN
4.0000 mg | Freq: Once | INTRAMUSCULAR | Status: AC
  Administered 2015-07-23: 4 mg via INTRAVENOUS
  Filled 2015-07-23: qty 2

## 2015-07-23 MED ORDER — SODIUM CHLORIDE 0.9 % IV SOLN
INTRAVENOUS | Status: DC
  Administered 2015-07-23: 12:00:00 via INTRAVENOUS

## 2015-07-23 MED ORDER — TRAMADOL HCL 50 MG PO TABS: 50.0000 mg | ORAL_TABLET | Freq: Four times a day (QID) | ORAL | Status: DC | PRN

## 2015-07-23 MED FILL — traMADol HCL 50 MG TABS: 50 | 5 days supply | Qty: 20 | Fill #0

## 2015-07-23 NOTE — Discharge Instructions (Signed)
As we discussed she have rib fractures on the left side. No internal organ injuries. Interrogation of your pacemaker showed no significant abnormal findings to explain the syncope. Return for any new or worse symptoms. Take the tramadol as directed. Make an appointment to follow-up with your record Dr.

## 2015-07-23 NOTE — ED Notes (Signed)
Dizzy and fell x 1 week ago

## 2015-07-23 NOTE — ED Provider Notes (Addendum)
CSN: [id number]     Arrival date & time 07/23/15  [id number] History   First MD Initiated Contact with Patient 07/23/15 1028     Chief Complaint  Patient presents with  . Fall     (Consider location/radiation/quality/duration/timing/severity/associated sxs/prior Treatment) The history is provided by the patient.   75 year old male with a pacemaker. For sinus nodal dysfunction. Basically had battery updated in January. Patient seen in follow-up by infectious disease on Monday. For healing wound to foot and leg. They wanted him to be evaluated in the emergency department at that time for the syncopal episode that occurred on Thursday. Resulting with a falling and a injury to his left flank and left chest area.  Patient's tetanus is up-to-date. Patient is not sure why he passed out. He fell into a piece of furniture resulting in a big bruise and tenderness to his left abdominal flank and left chest wall. Also there was an abrasion to that area. Patient is not on blood thinners. Patient states that he felt dizzy prior to passing out.  Patient has had no further syncopal episodes. Past Medical History  Diagnosis Date  . Hypertension   . Arthritis   . Anxiety   . Sinus node dysfunction (HCC)   . Cardiac pacemaker in situ   . CHF (congestive heart failure) ([place])   . CAD (coronary artery disease)     Nonobstructive CAD per cath  . Dyspnea   . Insulin dependent type 2 diabetes mellitus ([place]) 1991    followd by [name]--  has insulin pump  . Insulin pump in place   . Anemia, iron deficiency   . Secondary hyperparathyroidism of renal origin ([place])   . 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 History of skin cancer   . Chronic ulcer of right foot ([place])   . History of cellulitis     right great toe 10-25-2014  . BPH (benign prostatic hypertrophy)   . Peripheral neuropathy (HCC)     severe  . Peripheral vascular disease ([place])     bilateral  lower extremities  . OSA on CPAP    Past Surgical History  Procedure Laterality Date  . Vein ligation and stripping    . 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 Orif ankle fracture Left 11/06/2014    Procedure: OPEN REDUCTION INTERNAL FIXATION (ORIF) LEFT  ANKLE FRACTURE;  Surgeon: [name], MD;  Location: [facility];  Service: Orthopedics;  Laterality: Left;  . Cardiac catheterization  11-25-2010   [place], [place]    Nonobstructive CAD  . Cardiac pacemaker placement  Nov 2009    [employer]  . Total knee arthroplasty    . Excision bone cyst Right 03/06/2015    Procedure: BONE BIOPSIES OF RIGHT FOOT;  Surgeon: [name], MD;  Location: [facility];  Service: Podiatry;  Laterality: Right;  . Amputation toe Right 03/12/2015    Procedure: RIGHT HALLUS AMPUTATION ;  Surgeon: [name], MD;  Location: [facility];  Service: Podiatry;  Laterality: Right;  . Ep implantable device N/A 06/03/2015    Procedure: PPM Generator Changeout;  Surgeon: [name], MD;  Location: [facility];  Service: Cardiovascular;  Laterality: N/A;   Family History  Problem Relation Age of Onset  . Cancer Mother     breast  . Heart attack Father  Social History  Substance Use Topics  . Smoking status: Former Smoker -- 2.00 packs/day for 30 years    Quit date: 03/03/1984  . Smokeless tobacco: Never Used  . Alcohol Use: 0.6 oz/week    1 Glasses of wine per week     Comment: social    Review of Systems  Constitutional: Negative for fever.  HENT: Negative for congestion.   Eyes: Negative for redness.  Respiratory: Negative for shortness of breath.   Cardiovascular: Positive for chest pain.  Gastrointestinal: Positive for abdominal pain. Negative for nausea and vomiting.  Musculoskeletal: Negative for back pain and neck pain.  Skin: Positive for wound.  Neurological: Positive for syncope. Negative for  headaches.  Hematological: Does not bruise/bleed easily.  Psychiatric/Behavioral: Negative for confusion.      Allergies  Review of patient's allergies indicates no known allergies.  Home Medications   Prior to Admission medications   Medication Sig Start Date End Date Taking? Authorizing Provider  acetaminophen (TYLENOL) 500 MG tablet Take 1,000 mg by mouth every 6 (six) hours as needed for fever. Reported on 06/17/2015    Historical Provider, MD  aspirin EC 325 MG tablet Take 1 tablet (325 mg total) by mouth daily. 11/06/14   Corky Sing, PA-C  calcitRIOL (ROCALTROL) 0.25 MCG capsule Take 0.25 mcg by mouth daily.  05/30/14   Historical Provider, MD  carvedilol (COREG) 25 MG tablet Take 1 tablet (25 mg total) by mouth 2 (two) times daily with a meal. 03/24/15   [name], FNP  doxazosin (CARDURA) 4 MG tablet Take 1 tablet (4 mg total) by mouth daily. 11/04/14   [name], FNP  hydrocortisone cream 1 % Apply to affected area 2 times daily 07/05/15   Leo Grosser, MD  insulin aspart (NOVOLOG) 100 UNIT/ML injection Use max 120 units per day with CONTINUOUS  insulin pump. DX CODE E11.49 06/09/15   Elayne Snare, MD  isosorbide mononitrate (IMDUR) 30 MG 24 hr tablet TAKE TWO TABLETS BY MOUTH ONCE DAILY 12/24/13   [name], MD  isosorbide mononitrate (IMDUR) 30 MG 24 hr tablet TAKE TWO TABLETS BY MOUTH ONCE DAILY 07/06/15   [name], MD  Liraglutide (VICTOZA) 18 MG/3ML SOPN Inject 0.3 mLs (1.8 mg total) into the skin daily. 05/28/15   Elayne Snare, MD  lisinopril (PRINIVIL,ZESTRIL) 5 MG tablet Take 5 mg by mouth daily. 07/15/15   Historical Provider, MD  Omega-3 Fatty Acids (FISH OIL) 1000 MG CAPS Take by mouth 2 (two) times daily.    Historical Provider, MD  pantoprazole (PROTONIX) 40 MG tablet Take 1 tablet (40 mg total) by mouth daily. 08/04/14   [name], FNP  sertraline (ZOLOFT) 50 MG tablet Take 1 tablet (50 mg total) by mouth daily. 05/25/15   Aleksei Plotnikov V, MD   torsemide (DEMADEX) 20 MG tablet Take 2 tablets (40 mg total) by mouth daily. 01/26/15   [name], FNP   BP 192/68 mmHg  Pulse 70  Temp(Src) 98.1 F (36.7 C) (Oral)  Resp 20  Ht 6\' 4"  (1.93 m)  Wt 125.646 kg  BMI 33.73 kg/m2  SpO2 99% Physical Exam  Constitutional: He is oriented to person, place, and time. He appears well-developed and well-nourished. No distress.  HENT:  Head: Normocephalic and atraumatic.  Mouth/Throat: Oropharynx is clear and moist.  Eyes: Conjunctivae and EOM are normal. Pupils are equal, round, and reactive to light.  Neck: Normal range of motion. Neck supple.  Cardiovascular: Normal  rate, regular rhythm and normal heart sounds.   No murmur heard. Pulmonary/Chest: Effort normal and breath sounds normal. He exhibits tenderness.  Contusion to left lateral lower ribs. Encompassing also left flank total measurement 15 cm tender to palpation. No crepitance. Associated superficial skin abrasion. No signs of infection. Abrasion somewhat L- C-shaped measuring about 7 cm.  Abdominal: Soft. Bowel sounds are normal. There is tenderness.  Left flank left upper quadrant with large contusion measuring about 15 cm. With tenderness to palpation.  Musculoskeletal: Normal range of motion. He exhibits no tenderness.  Neurological: He is alert and oriented to person, place, and time. No cranial nerve deficit. He exhibits normal muscle tone. Coordination normal.  Skin: Skin is warm. No rash noted.  Nursing note and vitals reviewed.   ED Course  Procedures (including critical care time) Labs Review Labs Reviewed - No data to display  Imaging Review No results found. I have personally reviewed and evaluated these images and lab results as part of my medical decision-making.   EKG Interpretation None        ED ECG REPORT   Date: 07/23/2015  Rate: 72  Rhythm: Paced rhythm with pacer spikes.  QRS Axis: normal  Intervals: Paced rhythm  ST/T Wave  abnormalities: nonspecific ST/T changes  Conduction Disutrbances:Paced rhythm  Narrative Interpretation:   Old EKG Reviewed: none available  I have personally reviewed the EKG tracing and agree with the computerized printout as noted.  MDM   Final diagnoses:  Syncope, unspecified syncope type  Fall, initial encounter  Pacemaker  Contusion, flank, initial encounter  Abrasion of abdominal wall, initial encounter    Patient seen by infectious disease on Monday was recommended for emergency department evaluation for the syncopal episode and fall that occurred on Thursday. Patient presented today with complaint of left side pain. Patient with large contusion over the left lower lateral ribs and the left flank and left upper quadrant.  Patient did pass out on Thursdays not clear why.  Workup here cardiac interrogation of his pacemaker showed no significant abnormalities. CT of chest showed rib fractures 7,8,9 on the left side. CT of abdomen showed no internal organ injuries.  Patient wanted to be treated only with tramadol. Prescription provided to follow-up with his regular doctor.    Fredia Sorrow, MD 07/23/15 [place], MD 07/23/15 1426

## 2015-07-23 NOTE — ED Notes (Signed)
[employer] is paging out the call to the rep

## 2015-07-24 ENCOUNTER — Telehealth: Payer: Self-pay | Admitting: Internal Medicine

## 2015-07-24 ENCOUNTER — Encounter: Payer: Self-pay | Admitting: Family

## 2015-07-24 ENCOUNTER — Ambulatory Visit (INDEPENDENT_AMBULATORY_CARE_PROVIDER_SITE_OTHER): Payer: Medicare Other | Admitting: Family

## 2015-07-24 VITALS — BP 100/40 | HR 67 | Temp 98.7°F | Ht 76.0 in | Wt 286.0 lb

## 2015-07-24 DIAGNOSIS — I2510 Atherosclerotic heart disease of native coronary artery without angina pectoris: Secondary | ICD-10-CM

## 2015-07-24 DIAGNOSIS — S2232XD Fracture of one rib, left side, subsequent encounter for fracture with routine healing: Secondary | ICD-10-CM

## 2015-07-24 DIAGNOSIS — S2239XA Fracture of one rib, unspecified side, initial encounter for closed fracture: Secondary | ICD-10-CM | POA: Insufficient documentation

## 2015-07-24 DIAGNOSIS — R55 Syncope and collapse: Secondary | ICD-10-CM | POA: Diagnosis not present

## 2015-07-24 HISTORY — DX: Fracture of one rib, unspecified side, initial encounter for closed fracture: S22.39XA

## 2015-07-24 HISTORY — DX: Syncope and collapse: R55

## 2015-07-24 MED ORDER — PANTOPRAZOLE SODIUM 40 MG PO TBEC: 40.0000 mg | DELAYED_RELEASE_TABLET | Freq: Every day | ORAL | Status: DC

## 2015-07-24 NOTE — Assessment & Plan Note (Addendum)
This is a new problem. Rib fractures appear to be healing adequately without complication. Discussed importance of deep breathing and cough t prevent pneumonia. Continue current dosage of Tramadol as needed for discomfort. Clean wound with soap and water. Follow up if symptoms worsen.

## 2015-07-24 NOTE — Patient Instructions (Signed)
Thank you for choosing [facility].  Summary/Instructions:  Please continue to take your medication as prescribed.   Wash your wound daily with soap and water.   Deep breaths and coughs every hour that you are awake.  Follow up for symptom worsening.   Rib Fracture A rib fracture is a break or crack in one of the bones of the ribs. The ribs are a group of long, curved bones that wrap around your chest and attach to your spine. They protect your lungs and other organs in the chest cavity. A broken or cracked rib is often painful, but most do not cause other problems. Most rib fractures heal on their own over time. However, rib fractures can be more serious if multiple ribs are broken or if broken ribs move out of place and push against other structures. CAUSES   A direct blow to the chest. For example, this could happen during contact sports, a car accident, or a fall against a hard object.  Repetitive movements with high force, such as pitching a baseball or having severe coughing spells. SYMPTOMS   Pain when you breathe in or cough.  Pain when someone presses on the injured area. DIAGNOSIS  Your caregiver will perform a physical exam. Various imaging tests may be ordered to confirm the diagnosis and to look for related injuries. These tests may include a chest X-ray, computed tomography (CT), magnetic resonance imaging (MRI), or a bone scan. TREATMENT  Rib fractures usually heal on their own in 1-3 months. The longer healing period is often associated with a continued cough or other aggravating activities. During the healing period, pain control is very important. Medication is usually given to control pain. Hospitalization or surgery may be needed for more severe injuries, such as those in which multiple ribs are broken or the ribs have moved out of place.  HOME CARE INSTRUCTIONS   Avoid strenuous activity and any activities or movements that cause pain. Be careful during  activities and avoid bumping the injured rib.  Gradually increase activity as directed by your caregiver.  Only take over-the-counter or prescription medications as directed by your caregiver. Do not take other medications without asking your caregiver first.  Apply ice to the injured area for the first 1-2 days after you have been treated or as directed by your caregiver. Applying ice helps to reduce inflammation and pain.  Put ice in a plastic bag.  Place a towel between your skin and the bag.   Leave the ice on for 15-20 minutes at a time, every 2 hours while you are awake.  Perform deep breathing as directed by your caregiver. This will help prevent pneumonia, which is a common complication of a broken rib. Your caregiver may instruct you to:  Take deep breaths several times a day.  Try to cough several times a day, holding a pillow against the injured area.  Use a device called an incentive spirometer to practice deep breathing several times a day.  Drink enough fluids to keep your urine clear or pale yellow. This will help you avoid constipation.   Do not wear a rib belt or binder. These restrict breathing, which can lead to pneumonia.  SEEK IMMEDIATE MEDICAL CARE IF:   You have a fever.   You have difficulty breathing or shortness of breath.   You develop a continual cough, or you cough up thick or bloody sputum.  You feel sick to your stomach (nausea), throw up (vomit), or have abdominal  pain.   You have worsening pain not controlled with medications.  MAKE SURE YOU:  Understand these instructions.  Will watch your condition.  Will get help right away if you are not doing well or get worse.   This information is not intended to replace advice given to you by your health care provider. Make sure you discuss any questions you have with your health care provider.   Document Released: 05/02/2005 Document Revised: 01/02/2013 Document Reviewed:  07/04/2012 Elsevier Interactive Patient Education [employer].

## 2015-07-24 NOTE — Assessment & Plan Note (Signed)
Syncope of undetermined origin which does not appear to be of cardiac origin. Has not had any additional episodes and is most likely vasovagal. No further treatment needed at this time. Follow up if symptoms return.

## 2015-07-24 NOTE — Progress Notes (Signed)
Pre visit review using our clinic review tool, if applicable. No additional management support is needed unless otherwise documented below in the visit note. 

## 2015-07-24 NOTE — Telephone Encounter (Signed)
New message      Pt c/o Syncope: STAT if syncope occurred within 30 minutes and pt complains of lightheadedness High Priority if episode of passing out, completely, today or in last 24 hours   1. Did you pass out today? no  When is the last time you passed out? Last thursday 2. Has this occurred multiple times? no  3. Did you have any symptoms prior to passing out?  Pt was dizzy. 4. Patient's PCP thinks the syncope was due to his isosorbide.  Talk to the nurse about changing medication.  Also, wife states that the patient's bp has been running low.  Bottom number has been bewtween 40-50.

## 2015-07-24 NOTE — Progress Notes (Signed)
Subjective:    Patient ID: David [name], male    DOB: 11-10-1940, 75 y.o.   MRN: [id number]  Chief Complaint  Patient presents with  . Follow-up    ER visit 07/23/2015  . Abrasion    On the left side of abdomen. red around the edges with scabs and about 1 1/2 to 2 inches long. scratch is not very wide. pt states that it is hot to touch.    HPI:  David [name] is a 75 y.o. 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 and presents today for an ED follow up.  Recently evaluated in the emergency department for a syncopal episode that occurred approximately a week prior resulting in a fall and injury to his left flank and left chest area. He was unsure as to why he passed out. When he fell he struck a large piece of furniture and described a big bruise and tenderness to his left abdomen and flank. He did feel dizzy prior to passing out. No additional syncopal episodes were noted. Physical exam with a wound and chest pain secondary to contusion. Cardiac workup included interrogation of his pacemaker which showed no significant abnormalities. CT of the chest showed rib fractures of 7, 8, and 9 on the left side. With no internal organ damage noted on CT of the abdomen. He was discharged from the ED with a prescription for tramadol for discomfort.  Since leaving the ED.Reports taking the tramadol as prescribed and notes  improvement in his pain. Reports no episodes of dizziness or syncope since the initial episode. Blood sugars have been slighlty high with no hypoglycemic readings. Does indicate that he drinks water and minimizes caffeine.   No Known Allergies   Current Outpatient Prescriptions on File Prior to Visit  Medication Sig Dispense Refill  . acetaminophen (TYLENOL) 500 MG tablet Take 1,000 mg by mouth every 6 (six) hours as needed for fever. Reported on 06/17/2015    . aspirin EC 325 MG tablet Take 1 tablet (325 mg total) by mouth daily. 42 tablet 0  . calcitRIOL (ROCALTROL) 0.25 MCG capsule Take 0.25 mcg by mouth daily.     . carvedilol (COREG) 25 MG tablet Take 1 tablet (25 mg total) by mouth 2 (two) times daily with a meal. 90 tablet 1  . doxazosin (CARDURA) 4 MG tablet Take 1 tablet (4 mg total) by mouth daily. 90 tablet 1  . hydrocortisone cream 1 % Apply to affected area 2 times daily 15 g 0  . insulin aspart (NOVOLOG) 100 UNIT/ML injection Use max 120 units per day with CONTINUOUS  insulin pump. DX CODE E11.49 40 mL 5  . isosorbide mononitrate (IMDUR) 30 MG 24 hr tablet TAKE TWO TABLETS BY MOUTH ONCE DAILY 60 tablet 0  . isosorbide mononitrate (IMDUR) 30 MG 24 hr tablet TAKE TWO TABLETS BY MOUTH ONCE DAILY 60 tablet 11  . Liraglutide (VICTOZA) 18 MG/3ML SOPN Inject 0.3 mLs (1.8 mg total) into the skin daily. 6 mL 2  .  lisinopril (PRINIVIL,ZESTRIL) 5 MG tablet Take 5 mg by mouth daily.    . Omega-3 Fatty Acids (FISH OIL) 1000 MG CAPS Take by mouth 2 (two) times daily.    . sertraline (ZOLOFT) 50 MG tablet Take 1 tablet (50 mg total) by mouth daily. 30 tablet 11  . torsemide (DEMADEX) 20 MG tablet Take 2 tablets (40 mg total) by mouth daily. 180 tablet 1  . traMADol (ULTRAM) 50 MG tablet Take 1 tablet (50 mg total) by mouth every 6 (six) hours as needed. 20 tablet 0   No current facility-administered medications on file prior to visit.     Past Surgical History  Procedure Laterality Date  .  Vein ligation and stripping    . 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 Orif ankle fracture Left 11/06/2014    Procedure: OPEN REDUCTION INTERNAL FIXATION (ORIF) LEFT  ANKLE FRACTURE;  Surgeon: [name], MD;  Location: [facility];  Service: Orthopedics;  Laterality: Left;  . Cardiac catheterization  11-25-2010   [place], [place]    Nonobstructive CAD  . Cardiac pacemaker placement  Nov 2009    Medtronic  . Total knee arthroplasty    . Excision bone cyst Right 03/06/2015    Procedure: BONE BIOPSIES OF RIGHT FOOT;  Surgeon: [name], MD;  Location: [facility];  Service: Podiatry;  Laterality: Right;  . Amputation toe Right 03/12/2015    Procedure: RIGHT HALLUS AMPUTATION ;  Surgeon: [name], MD;  Location: [facility];  Service: Podiatry;  Laterality: Right;  . Ep implantable device N/A 06/03/2015    Procedure: PPM Generator Changeout;  Surgeon: [name], MD;  Location: [facility];  Service: Cardiovascular;  Laterality: N/A;    Past Medical History  Diagnosis Date  . Hypertension   . Arthritis   . Anxiety   . Sinus node dysfunction (HCC)   . Cardiac pacemaker in situ   . CHF (congestive heart failure) ([place])   . CAD (coronary artery disease)     Nonobstructive CAD per cath  . Dyspnea   . Insulin dependent type 2 diabetes mellitus ([place]) 1991    followd by [name]--  has insulin pump  . Insulin pump in place   . Anemia, iron deficiency   . Secondary hyperparathyroidism of renal origin ([place])   . 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 History of skin cancer   . Chronic ulcer of right foot ([place])   . History of cellulitis     right great toe 10-25-2014  . BPH (benign prostatic hypertrophy)   . Peripheral neuropathy (HCC)     severe  . Peripheral vascular disease ([place])     bilateral lower extremities  . OSA on CPAP      Review of Systems  Constitutional: Negative for fever and chills.  Respiratory: Negative for chest tightness and shortness of breath.   Cardiovascular: Negative for chest pain, palpitations and leg swelling.  Skin: Positive for wound.  Neurological: Negative for dizziness, syncope, weakness, light-headedness and headaches.      Objective:    BP 100/40 mmHg  Pulse 67  Temp(Src) 98.7 F (37.1 C) (Oral)  Ht 6\' 4"  (1.93 m)  Wt 286 lb (129.729 kg)  BMI 34.83 kg/m2  SpO2 95% Nursing note and vital signs reviewed.  Physical Exam  Constitutional: He is  oriented to person, place, and time. He appears well-developed and well-nourished. No distress.  Cardiovascular: Normal rate, regular rhythm, normal heart sounds and intact distal pulses.   Pulmonary/Chest: Effort normal and breath sounds normal.  Left ribs - mild discoloration and edema with no obvious deformity. Palpable tenderness along ribs 7, 8, 9. Laceration with good approximation and healing and no evidence of infection noted.  Neurological: He is alert and oriented to person, place, and time.  Skin: Skin is warm and dry.  Psychiatric: He has a normal mood and affect. His behavior is normal. Judgment and thought content normal.       Assessment & Plan:   Problem List Items Addressed This Visit      Cardiovascular and Mediastinum   Syncope    Syncope of undetermined origin which does not appear to be of cardiac origin. Has not had any additional episodes and is most likely vasovagal. No further treatment needed at this time. Follow up if symptoms return.         Musculoskeletal and Integument   Rib fracture - Primary    This is a new problem. Rib fractures appear to be healing adequately without complication. Discussed importance of deep breathing and cough t prevent pneumonia. Continue current dosage of Tramadol as needed for discomfort. Clean wound with soap and water. Follow up if symptoms worsen.         I am  having Mr. Bures maintain his Fish Oil, isosorbide mononitrate, calcitRIOL, acetaminophen, doxazosin, aspirin EC, torsemide, carvedilol, sertraline, Liraglutide, insulin aspart, isosorbide mononitrate, hydrocortisone cream, lisinopril, traMADol, and pantoprazole.

## 2015-07-24 NOTE — Telephone Encounter (Signed)
Wife calling stating that Mr. Borchert was dizzy and passed out last Thurs 07/16/15.  Also states that his BP has been low. States he became dizzy and fell into some furniture causing a big bruise to his left side and left chest.  He is not sure cause of passing out.  Was seen in ER on 3/9 for pain in his left side.  They interrogated his pacemaker and did a CT chest with rib fractures; CT abdomen showed no internal organ injuries. Given Tramadol for pain and no recommendations to his passing out.  Seen by his PCP, Margit Banda today 3/10  and recommended he call our office since his blood pressure has been low and to see if could be caused by the Isosorbide.  Wife states he has been on Isosorbide for several yrs.  When first seen by Dr. Caryl Comes in 2015 he was taking 30 mg daily and Dr. Caryl Comes increased isosorbide from 30 mg daily to 30 mg BID.  She states he has not had any dizziness or "passing out" since last Thursday, 3/2.  She states his BP has been running low and not sure of cause.  Advised that Dr. Caryl Comes not in office until Tues PM but will route message to him.  He is currently stable and advised someone will get back in touch with her.

## 2015-08-05 DIAGNOSIS — L97509 Non-pressure chronic ulcer of other part of unspecified foot with unspecified severity: Secondary | ICD-10-CM | POA: Diagnosis not present

## 2015-08-05 DIAGNOSIS — E1140 Type 2 diabetes mellitus with diabetic neuropathy, unspecified: Secondary | ICD-10-CM | POA: Diagnosis not present

## 2015-08-05 DIAGNOSIS — Z09 Encounter for follow-up examination after completed treatment for conditions other than malignant neoplasm: Secondary | ICD-10-CM | POA: Diagnosis not present

## 2015-08-11 ENCOUNTER — Other Ambulatory Visit (INDEPENDENT_AMBULATORY_CARE_PROVIDER_SITE_OTHER): Payer: Medicare Other

## 2015-08-11 DIAGNOSIS — E1165 Type 2 diabetes mellitus with hyperglycemia: Secondary | ICD-10-CM | POA: Diagnosis not present

## 2015-08-11 DIAGNOSIS — Z794 Long term (current) use of insulin: Secondary | ICD-10-CM

## 2015-08-11 LAB — LIPID PANEL
Cholesterol: 151 mg/dL (ref 0–200)
HDL: 21.6 mg/dL — ABNORMAL LOW (ref >39.00)
NonHDL: 129
Total CHOL/HDL Ratio: 7
Triglycerides: 283 mg/dL — ABNORMAL HIGH (ref 0.0–149.0)
VLDL: 56.6 mg/dL — ABNORMAL HIGH (ref 0.0–40.0)

## 2015-08-11 LAB — COMPREHENSIVE METABOLIC PANEL
ALT: 25 U/L (ref 0–53)
AST: 25 U/L (ref 0–37)
Albumin: 3.9 g/dL (ref 3.5–5.2)
Alkaline Phosphatase: 99 U/L (ref 39–117)
BUN: 42 mg/dL — ABNORMAL HIGH (ref 6–23)
CO2: 24 mEq/L (ref 19–32)
Calcium: 9.3 mg/dL (ref 8.4–10.5)
Chloride: 106 mEq/L (ref 96–112)
Creatinine, Ser: 1.8 mg/dL — ABNORMAL HIGH (ref 0.40–1.50)
GFR: 39.29 mL/min — ABNORMAL LOW (ref >60.00)
Glucose, Bld: 165 mg/dL — ABNORMAL HIGH (ref 70–99)
Potassium: 4.5 mEq/L (ref 3.5–5.1)
Sodium: 139 mEq/L (ref 135–145)
Total Bilirubin: 0.4 mg/dL (ref 0.2–1.2)
Total Protein: 6.8 g/dL (ref 6.0–8.3)

## 2015-08-11 LAB — LDL CHOLESTEROL, DIRECT: Direct LDL: 67 mg/dL

## 2015-08-11 LAB — HEMOGLOBIN A1C: Hgb A1c MFr Bld: 8.3 % — ABNORMAL HIGH (ref 4.6–6.5)

## 2015-08-14 ENCOUNTER — Encounter: Payer: Self-pay | Admitting: Endocrinology

## 2015-08-14 ENCOUNTER — Ambulatory Visit (INDEPENDENT_AMBULATORY_CARE_PROVIDER_SITE_OTHER): Payer: Medicare Other | Admitting: Endocrinology

## 2015-08-14 VITALS — BP 126/60 | HR 88 | Temp 97.9°F | Resp 16 | Ht 76.0 in | Wt 279.6 lb

## 2015-08-14 DIAGNOSIS — E1165 Type 2 diabetes mellitus with hyperglycemia: Secondary | ICD-10-CM | POA: Diagnosis not present

## 2015-08-14 DIAGNOSIS — I2510 Atherosclerotic heart disease of native coronary artery without angina pectoris: Secondary | ICD-10-CM | POA: Diagnosis not present

## 2015-08-14 DIAGNOSIS — I952 Hypotension due to drugs: Secondary | ICD-10-CM

## 2015-08-14 DIAGNOSIS — Z794 Long term (current) use of insulin: Secondary | ICD-10-CM | POA: Diagnosis not present

## 2015-08-14 DIAGNOSIS — E782 Mixed hyperlipidemia: Secondary | ICD-10-CM | POA: Diagnosis not present

## 2015-08-14 NOTE — Patient Instructions (Addendum)
STOP DOXAZOSIN  Gemfibrozil 600mg  at dinner for triglycerides  Check Bp sometimes standing up  Basal 3.7 6 a to 10p  Some sugars after supper also

## 2015-08-14 NOTE — Progress Notes (Signed)
Patient ID: David [name], male   DOB: 1941-01-16, 75 y.o.   MRN: [id number]    Reason for Appointment: Followup for Type 2 Diabetes  History of Present Illness:          Diagnosis: Type 2 diabetes mellitus, date of diagnosis:   1992       Past history:  He was initially treated with metformin and at some point also glipizide. His previous records from out of town are not available and no information is available about his level of control Apparently he was started on insulin in 1994 approximately because of poor control He has been on various insulin regimens over the last several years However even with insulin he has had poor control for at least the last 7 or 8 years. He does not know what his previous A1c levels have been. He had been continued on metformin and glipizide but metformin stopped about 2 years ago because of kidney function abnormality He had been taking Lantus 60 units twice a day with NovoLog previously and also Before his initial consultation in 6/15 he was on NovoLog twice a day and Humalog mix insulin  Because of poor control and large insulin doses he was started on Victoza in 6/15  Recent history:   INSULIN PUMP  regimen is as follows   Basal rates:Midnight = 2.9, 6 AM = 3.55, 1 PM = 3.8 and 10 PM = 3.0  Carbohydrate coverage 1:7 for lunch otherwise 1:5, correction 1:10 with target 120-150  Average total daily insulin 89 +/-4 units, basal insulin is 79 %  Since 04/28/14 he has been on a Medtronic insulin pump because of persistent poor control and high insulin requirement Initially with starting the pump his blood sugars had been significantly better but subsequently blood sugars have been again high with A1c again  over 8%  Current management, problems and blood sugar patterns:  Checking blood sugars mostly before meals and now up to 3 times a day on average  Since he is checking his blood sugar only at his 2 meals not clear what his  postprandial readings are  Currently is insulin and mostly basal, his boluses he is only 11% of his total daily  Blood sugars are variable at suppertime but not higher now  Still his HIGHEST blood sugars are before his first meal or on waking up  He does the boluses right before eating and he thinks that he does this even when he is eating out  He is  taking Victoza more regularly  Not able to do any exercise       Oral hypoglycemic drugs the patient is taking are: None, has renal dysfunction        Glucose monitoring:  done about 2-3 times a day         Glucometer:  contour     Blood Glucose readings from pump download:  Mean values apply above for all meters except median for One Touch  PRE-MEAL Fasting Lunch Dinner Bedtime Overall  Glucose range: 1 39-251  122-205  79-196  220    Mean/median:     163   Hypoglycemia: None   Glycemic control:   Lab Results  Component Value Date   HGBA1C 8.3* 08/11/2015   HGBA1C 8.8* 05/15/2015   HGBA1C 8.7* 12/15/2014   Lab Results  Component Value Date   MICROALBUR 9.2* 05/15/2015   LDLCALC 49 10/29/2013   CREATININE 1.80* 08/11/2015    Self-care: The  diet that the patient has been following is: tries to limit fat intake   Meals: 2- 3 meals per day.breakfast is either cereal or eggs and toast, lunch is usually a sandwich , dinner at  6-7 pm.   He thinks his portions are small but periodically eats sweets          Exercise:  not doing exercise bike, previously  was doing 15-30 min        Dietician visit: Most recent: several years ago.               Compliance with the medical regimen: good Retinal exam: Most recent: 05/2013.    Weight history:  Wt Readings from Last 3 Encounters:  08/14/15 279 lb 9.6 oz (126.826 kg)  07/24/15 286 lb (129.729 kg)  07/23/15 277 lb (125.646 kg)        Medication List       This list is accurate as of: 08/14/15 11:59 PM.  Always use your most recent med list.                acetaminophen 500 MG tablet  Commonly known as:  TYLENOL  Take 1,000 mg by mouth every 6 (six) hours as needed for fever. Reported on 06/17/2015     aspirin EC 325 MG tablet  Take 1 tablet (325 mg total) by mouth daily.     calcitRIOL 0.25 MCG capsule  Commonly known as:  ROCALTROL  Take 0.25 mcg by mouth daily.     carvedilol 25 MG tablet  Commonly known as:  COREG  Take 1 tablet (25 mg total) by mouth 2 (two) times daily with a meal.     doxazosin 4 MG tablet  Commonly known as:  CARDURA  Take 1 tablet (4 mg total) by mouth daily.     Fish Oil 1000 MG Caps  Take by mouth 2 (two) times daily.     hydrocortisone cream 1 %  Apply to affected area 2 times daily     insulin aspart 100 UNIT/ML injection  Commonly known as:  NOVOLOG  Use max 120 units per day with CONTINUOUS  insulin pump. DX CODE E11.49     isosorbide mononitrate 30 MG 24 hr tablet  Commonly known as:  IMDUR  TAKE TWO TABLETS BY MOUTH ONCE DAILY     isosorbide mononitrate 30 MG 24 hr tablet  Commonly known as:  IMDUR  TAKE TWO TABLETS BY MOUTH ONCE DAILY     Liraglutide 18 MG/3ML Sopn  Commonly known as:  VICTOZA  Inject 0.3 mLs (1.8 mg total) into the skin daily.     lisinopril 5 MG tablet  Commonly known as:  PRINIVIL,ZESTRIL  Take 5 mg by mouth daily.     pantoprazole 40 MG tablet  Commonly known as:  PROTONIX  Take 1 tablet (40 mg total) by mouth daily.     sertraline 50 MG tablet  Commonly known as:  ZOLOFT  Take 1 tablet (50 mg total) by mouth daily.     torsemide 20 MG tablet  Commonly known as:  DEMADEX  Take 2 tablets (40 mg total) by mouth daily.     traMADol 50 MG tablet  Commonly known as:  ULTRAM  Take 1 tablet (50 mg total) by mouth every 6 (six) hours as needed.        Allergies: No Known Allergies  Past Medical History  Diagnosis Date  . Hypertension   . Arthritis   . Anxiety   .  Sinus node dysfunction (HCC)   . Cardiac pacemaker in situ   . CHF (congestive heart  failure) ([place])   . CAD (coronary artery disease)     Nonobstructive CAD per cath  . Dyspnea   . Insulin dependent type 2 diabetes mellitus ([place]) 1991    followd by [name]--  has insulin pump  . Insulin pump in place   . Anemia, iron deficiency   . Secondary hyperparathyroidism of renal origin ([place])   . 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 History of skin cancer   . Chronic ulcer of right foot ([place])   . History of cellulitis     right great toe 10-25-2014  . BPH (benign prostatic hypertrophy)   . Peripheral neuropathy (HCC)     severe  . Peripheral vascular disease ([place])     bilateral lower extremities  . OSA on CPAP     Past Surgical History  Procedure Laterality Date  . Vein ligation and stripping    . 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 Orif ankle fracture Left 11/06/2014    Procedure: OPEN REDUCTION INTERNAL FIXATION (ORIF) LEFT  ANKLE FRACTURE;  Surgeon: [name], MD;  Location: [facility];  Service: Orthopedics;  Laterality: Left;  . Cardiac catheterization  11-25-2010   [place], [place]    Nonobstructive CAD  . Cardiac pacemaker placement  Nov 2009    Medtronic  . Total knee arthroplasty    . Excision bone cyst Right 03/06/2015    Procedure: BONE BIOPSIES OF RIGHT FOOT;  Surgeon: [name], MD;  Location: [facility];  Service: Podiatry;  Laterality: Right;  . Amputation toe Right 03/12/2015    Procedure: RIGHT HALLUS AMPUTATION ;  Surgeon: [name], MD;  Location: [facility];  Service: Podiatry;  Laterality: Right;  . Ep implantable device N/A 06/03/2015    Procedure: PPM Generator Changeout;  Surgeon: [name], MD;  Location: [facility];  Service: Cardiovascular;  Laterality: N/A;    Family History  Problem Relation Age of Onset  . Cancer Mother     breast  . Heart attack Father     Social  History:  reports that he quit smoking about 31 years ago. He has never used smokeless tobacco. He reports that he drinks about 0.6 oz of alcohol per week. He reports that he does not use illicit drugs.    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 Also had taken Gembrozil for several years without apparent side effects.  Both of these were  stopped because of increased liver functions and LFT has been back to normal LDL has not been high subsequently but HDL is low  Currently taking only fish oil and LDL is within target but triglycerides are Nearly 300       Lab Results  Component Value Date   CHOL 151 08/11/2015   HDL 21.60* 08/11/2015   LDLCALC 49 10/29/2013   LDLDIRECT 67.0 08/11/2015   TRIG 283.0* 08/11/2015   CHOLHDL 7 08/11/2015      HYPERTENSION: The blood pressure has been treated with various drugs.   Currently taking 4  mg doxazosin, carvedilol, lisinopril  Home BP Unknown as his wife is checking attorney does not remember the numbers    he  had an episode of syncope when he got up to go to the bathroom and felt lightheaded before passing out No change in medications has been made   Chronic kidney disease: Etiology unknown, is followed by nephrologist  Creatinine level has been variable Last creatinine:  Lab Results  Component Value Date   CREATININE 1.80* 08/11/2015        Has history of Numbness in his feet  since about 2013, no tingling or burning in feet    Previous foot exam findings:   Vibration sense is absent in toes. Ankle jerks are absent bilaterally.          Diabetic foot exam:  No callus formation. Mild denudation of skin on left second toe superiorly Amputation of right third toe. Thickening and dryness of skin on the toes on the right Absent monofilament sensation in the feet Absent pedal  pulses     Physical Examination:  BP 126/60 mmHg  Pulse 88  Temp(Src) 97.9 F (36.6 C)  Resp 16  Ht 6\' 4"  (1.93 m)  Wt 279 lb 9.6 oz (126.826 kg)  BMI 34.05 kg/m2  SpO2 96%   ASSESSMENT:  Diabetes type 2, uncontrolled with obesity  His blood sugars are overall still difficult to control despite nearly 100 units of insulin a day with this pump  A1c usually over 8% See history of present illness for detailed discussion of his current management, blood sugar patterns and problems identified  He is still getting high readings before his first meal  Not enough readings checked after meals to assess his mealtime coverage  Blood sugars are variable at suppertime but improving He has started being a little more active and encouraged him to start a walking program   PLAN:   Pump settings will be changed  with basal 3.7 from 6 AM-10 PM   To check some readings in between meals especially after supper to verify postprandial control and carbohydrate coverage  Encouraged him to be more active with walking for weight loss   continue Victoza    Hypertension: Blood pressure is significantly low standing up, not care of this is related to autonomic neuropathy  Since he has had an episode of syncope Will stop his doxazosin He needs to follow-up with PCP in 4 weeks  Hyperlipidemia: Triglycerides are high and will recommend  gemfibrozil 600 mg daily, this would be easier to use with his renal dysfunction  Counseling time on subjects discussed above is over 50% of today's 25 minute visit   Patient Instructions  STOP DOXAZOSIN  Gemfibrozil 600mg  at dinner for triglycerides  Check Bp sometimes standing up  Basal 3.7 6 a to 10p  Some sugars after supper also    Daanish Copes 08/16/2015, 9:09 PM   Note: This office note was prepared with Estate agent. Any transcriptional errors that result from this process are unintentional.

## 2015-08-17 ENCOUNTER — Other Ambulatory Visit: Payer: Self-pay | Admitting: Endocrinology

## 2015-08-17 DIAGNOSIS — Z09 Encounter for follow-up examination after completed treatment for conditions other than malignant neoplasm: Secondary | ICD-10-CM | POA: Diagnosis not present

## 2015-08-17 DIAGNOSIS — I70293 Other atherosclerosis of native arteries of extremities, bilateral legs: Secondary | ICD-10-CM | POA: Diagnosis not present

## 2015-08-17 DIAGNOSIS — L602 Onychogryphosis: Secondary | ICD-10-CM | POA: Diagnosis not present

## 2015-08-17 DIAGNOSIS — L97509 Non-pressure chronic ulcer of other part of unspecified foot with unspecified severity: Secondary | ICD-10-CM | POA: Diagnosis not present

## 2015-08-17 DIAGNOSIS — E1140 Type 2 diabetes mellitus with diabetic neuropathy, unspecified: Secondary | ICD-10-CM | POA: Diagnosis not present

## 2015-08-17 DIAGNOSIS — L84 Corns and callosities: Secondary | ICD-10-CM | POA: Diagnosis not present

## 2015-08-24 ENCOUNTER — Other Ambulatory Visit: Payer: Self-pay | Admitting: Endocrinology

## 2015-08-31 ENCOUNTER — Encounter: Payer: Self-pay | Admitting: Internal Medicine

## 2015-09-02 DIAGNOSIS — Z09 Encounter for follow-up examination after completed treatment for conditions other than malignant neoplasm: Secondary | ICD-10-CM | POA: Diagnosis not present

## 2015-09-02 DIAGNOSIS — L84 Corns and callosities: Secondary | ICD-10-CM | POA: Diagnosis not present

## 2015-09-02 DIAGNOSIS — L602 Onychogryphosis: Secondary | ICD-10-CM | POA: Diagnosis not present

## 2015-09-02 DIAGNOSIS — E1140 Type 2 diabetes mellitus with diabetic neuropathy, unspecified: Secondary | ICD-10-CM | POA: Diagnosis not present

## 2015-09-02 DIAGNOSIS — I70293 Other atherosclerosis of native arteries of extremities, bilateral legs: Secondary | ICD-10-CM | POA: Diagnosis not present

## 2015-09-02 DIAGNOSIS — L97509 Non-pressure chronic ulcer of other part of unspecified foot with unspecified severity: Secondary | ICD-10-CM | POA: Diagnosis not present

## 2015-09-14 ENCOUNTER — Ambulatory Visit (INDEPENDENT_AMBULATORY_CARE_PROVIDER_SITE_OTHER): Payer: Medicare Other | Admitting: *Deleted

## 2015-09-14 DIAGNOSIS — I495 Sick sinus syndrome: Secondary | ICD-10-CM

## 2015-09-14 NOTE — Progress Notes (Signed)
Remote pacemaker transmission.   

## 2015-09-18 ENCOUNTER — Other Ambulatory Visit: Payer: Self-pay | Admitting: Endocrinology

## 2015-09-24 DIAGNOSIS — I70293 Other atherosclerosis of native arteries of extremities, bilateral legs: Secondary | ICD-10-CM | POA: Diagnosis not present

## 2015-09-24 DIAGNOSIS — E1140 Type 2 diabetes mellitus with diabetic neuropathy, unspecified: Secondary | ICD-10-CM | POA: Diagnosis not present

## 2015-09-24 DIAGNOSIS — Z09 Encounter for follow-up examination after completed treatment for conditions other than malignant neoplasm: Secondary | ICD-10-CM | POA: Diagnosis not present

## 2015-09-24 DIAGNOSIS — L97509 Non-pressure chronic ulcer of other part of unspecified foot with unspecified severity: Secondary | ICD-10-CM | POA: Diagnosis not present

## 2015-10-07 DIAGNOSIS — E113311 Type 2 diabetes mellitus with moderate nonproliferative diabetic retinopathy with macular edema, right eye: Secondary | ICD-10-CM | POA: Diagnosis not present

## 2015-10-07 DIAGNOSIS — Z7984 Long term (current) use of oral hypoglycemic drugs: Secondary | ICD-10-CM | POA: Diagnosis not present

## 2015-10-07 DIAGNOSIS — H43813 Vitreous degeneration, bilateral: Secondary | ICD-10-CM | POA: Diagnosis not present

## 2015-10-07 DIAGNOSIS — E113292 Type 2 diabetes mellitus with mild nonproliferative diabetic retinopathy without macular edema, left eye: Secondary | ICD-10-CM | POA: Diagnosis not present

## 2015-10-07 DIAGNOSIS — Z961 Presence of intraocular lens: Secondary | ICD-10-CM | POA: Diagnosis not present

## 2015-10-07 DIAGNOSIS — Z794 Long term (current) use of insulin: Secondary | ICD-10-CM | POA: Diagnosis not present

## 2015-10-07 DIAGNOSIS — H52223 Regular astigmatism, bilateral: Secondary | ICD-10-CM | POA: Diagnosis not present

## 2015-10-07 DIAGNOSIS — H5203 Hypermetropia, bilateral: Secondary | ICD-10-CM | POA: Diagnosis not present

## 2015-10-07 LAB — HM DIABETES EYE EXAM

## 2015-10-11 ENCOUNTER — Other Ambulatory Visit: Payer: Self-pay | Admitting: Endocrinology

## 2015-10-19 ENCOUNTER — Ambulatory Visit (INDEPENDENT_AMBULATORY_CARE_PROVIDER_SITE_OTHER): Payer: Medicare Other | Admitting: Internal Medicine

## 2015-10-19 ENCOUNTER — Encounter: Payer: Self-pay | Admitting: Internal Medicine

## 2015-10-19 VITALS — BP 118/66 | HR 68 | Ht 76.0 in | Wt 275.2 lb

## 2015-10-19 DIAGNOSIS — Z9989 Dependence on other enabling machines and devices: Secondary | ICD-10-CM

## 2015-10-19 DIAGNOSIS — J309 Allergic rhinitis, unspecified: Secondary | ICD-10-CM | POA: Diagnosis not present

## 2015-10-19 DIAGNOSIS — I2510 Atherosclerotic heart disease of native coronary artery without angina pectoris: Secondary | ICD-10-CM | POA: Diagnosis not present

## 2015-10-19 DIAGNOSIS — L84 Corns and callosities: Secondary | ICD-10-CM | POA: Diagnosis not present

## 2015-10-19 DIAGNOSIS — I70293 Other atherosclerosis of native arteries of extremities, bilateral legs: Secondary | ICD-10-CM | POA: Diagnosis not present

## 2015-10-19 DIAGNOSIS — J302 Other seasonal allergic rhinitis: Secondary | ICD-10-CM

## 2015-10-19 DIAGNOSIS — G4733 Obstructive sleep apnea (adult) (pediatric): Secondary | ICD-10-CM | POA: Diagnosis not present

## 2015-10-19 DIAGNOSIS — L602 Onychogryphosis: Secondary | ICD-10-CM | POA: Diagnosis not present

## 2015-10-19 DIAGNOSIS — E1351 Other specified diabetes mellitus with diabetic peripheral angiopathy without gangrene: Secondary | ICD-10-CM | POA: Diagnosis not present

## 2015-10-19 DIAGNOSIS — J3089 Other allergic rhinitis: Principal | ICD-10-CM

## 2015-10-19 NOTE — Progress Notes (Signed)
06/25/2015-75 year old male former (2 packs per day 30 years quit 1985) smoker referred courtesy of Terri Piedra, NP; CPAP management-been in [place] for about 2 years-needs to establish DME here. Sleep Study done about 20 years ago. Complicating medical history including HBP, morbid obesity, DM 2, pacemaker/sinus node arrhythmia He has had only 2 machines in 20 years. Current mask and supplies are at least 75 years old. Quality of life definitely better with CPAP. No ENT surgery. Has pacemaker/no MI. No lung disease known but former smoker.  07/17/2015-75 year old male former smoker followed for OSA, complicated by HBP, morbid obesity, DM 2, pacemaker/sinus node arrhythmia Unattended Home Sleep Test 06/29/2015-AHI 25.3/hour, desaturation to 84%, body weight 282 pounds FOLLOWS FOR: Review HST 06/29/15. He needed to renew documentation of obstructive sleep apnea so he could get a new DME company and new CPAP machine. We reviewed the steps for this process  10/19/2015-75 year old male former smoker followed for OSA, K by HBP, morbid obesity, DM 2, pacemaker/sinus node arrhythmia CPAP auto 5-20/Advanced FOLLOWS FOR: DME [facility]. Pt wears CPAP nightly for about 8-9 hours. DL is attached. Download looks good. He says he sleeps well with CPAP.  ROS-see HPI   Negative unless "+" Constitutional:    weight loss, night sweats, fevers, chills, fatigue, lassitude. HEENT:    headaches, difficulty swallowing, tooth/dental problems, sore throat,       sneezing, itching, ear ache, nasal congestion, post nasal drip, snoring CV:    chest pain, orthopnea, PND, swelling in lower extremities, anasarca,                                                 dizziness, palpitations Resp:   shortness of breath with exertion or at rest.                productive cough,   non-productive cough, coughing up of blood.              change in color of mucus.  wheezing.   Skin:    rash or lesions. GI:  No-   heartburn, indigestion,  abdominal pain, nausea, vomiting, diarrhea,                 change in bowel habits, loss of appetite GU: dysuria, change in color of urine, no urgency or frequency.   flank pain. MS:   joint pain, stiffness, decreased range of motion, back pain. Neuro-     nothing unusual Psych:  change in mood or affect.  depression or anxiety.   memory loss.  OBJ- Physical Exam General- Alert, Oriented, Affect-appropriate, Distress- none acute, + obese Skin- rash-none, lesions- none, excoriation- none Lymphadenopathy- none Head- atraumatic            Eyes- Gross vision intact, PERRLA, conjunctivae and secretions clear            Ears- Hearing, canals-normal            Nose- Clear, no-Septal dev, mucus, polyps, erosion, perforation             Throat- Mallampati IV , mucosa clear , drainage- none, tonsils- atrophic, + habitual chewing toothpick Neck- flexible , trachea midline, no stridor , thyroid nl, carotid no bruit Chest - symmetrical excursion , unlabored           Heart/CV- RRR , no murmur , no gallop  ,  no rub, nl s1 s2                           - JVD- none , edema- none, stasis changes- none, varices- none           Lung- clear to P&A, wheeze- none, cough- none , dullness-none, rub- none           Chest wall- L pacemaker Abd-  Br/ Gen/ Rectal- Not done, not indicated Extrem- cyanosis- none, clubbing, none, atrophy- none, strength- nl Neuro- grossly intact to observation

## 2015-10-19 NOTE — Patient Instructions (Addendum)
We can continue CPAP Advanced, auto 5-20, mask of choice, humidifier, supplies, AirView, dx OSA  Please call if we can help

## 2015-10-21 NOTE — Assessment & Plan Note (Signed)
Compliance and control her good. Weight loss would help as discussed. No changes required.

## 2015-10-21 NOTE — Assessment & Plan Note (Signed)
This spring he is managed well with occasional antihistamine

## 2015-10-22 ENCOUNTER — Other Ambulatory Visit: Payer: Self-pay | Admitting: *Deleted

## 2015-10-22 DIAGNOSIS — I422 Other hypertrophic cardiomyopathy: Secondary | ICD-10-CM

## 2015-10-23 ENCOUNTER — Encounter: Payer: Self-pay | Admitting: Cardiology

## 2015-10-23 LAB — CUP PACEART REMOTE DEVICE CHECK
Battery Remaining Longevity: 97 mo
Battery Voltage: 3.02 V
Brady Statistic AP VP Percent: 0.07 %
Brady Statistic AP VS Percent: 99.75 %
Brady Statistic AS VP Percent: 0 %
Brady Statistic AS VS Percent: 0.18 %
Brady Statistic RA Percent Paced: 99.82 %
Brady Statistic RV Percent Paced: 0.07 %
Date Time Interrogation Session: 20170501133425
Implantable Lead Implant Date: 20091102
Implantable Lead Implant Date: 20091102
Implantable Lead Location: 753859
Implantable Lead Location: 753860
Implantable Lead Model: 5076
Implantable Lead Model: 5076
Lead Channel Impedance Value: 399 Ohm
Lead Channel Impedance Value: 418 Ohm
Lead Channel Impedance Value: 513 Ohm
Lead Channel Impedance Value: 532 Ohm
Lead Channel Pacing Threshold Amplitude: 0.75 V
Lead Channel Pacing Threshold Amplitude: 2.5 V
Lead Channel Pacing Threshold Pulse Width: 0.4 ms
Lead Channel Pacing Threshold Pulse Width: 0.4 ms
Lead Channel Sensing Intrinsic Amplitude: 1.875 mV
Lead Channel Sensing Intrinsic Amplitude: 1.875 mV
Lead Channel Sensing Intrinsic Amplitude: 20.25 mV
Lead Channel Sensing Intrinsic Amplitude: 20.25 mV
Lead Channel Setting Pacing Amplitude: 2.5 V
Lead Channel Setting Pacing Amplitude: 2.5 V
Lead Channel Setting Pacing Pulse Width: 0.4 ms
Lead Channel Setting Sensing Sensitivity: 2.8 mV

## 2015-11-06 ENCOUNTER — Other Ambulatory Visit: Payer: Self-pay | Admitting: Family

## 2015-11-09 ENCOUNTER — Encounter: Payer: Self-pay | Admitting: Internal Medicine

## 2015-11-11 ENCOUNTER — Ambulatory Visit (HOSPITAL_COMMUNITY): Payer: Medicare Other | Attending: Cardiology

## 2015-11-11 ENCOUNTER — Other Ambulatory Visit: Payer: Self-pay | Admitting: Internal Medicine

## 2015-11-11 ENCOUNTER — Other Ambulatory Visit: Payer: Self-pay

## 2015-11-11 DIAGNOSIS — I351 Nonrheumatic aortic (valve) insufficiency: Secondary | ICD-10-CM | POA: Diagnosis not present

## 2015-11-11 DIAGNOSIS — I517 Cardiomegaly: Secondary | ICD-10-CM | POA: Diagnosis not present

## 2015-11-11 DIAGNOSIS — I422 Other hypertrophic cardiomyopathy: Secondary | ICD-10-CM

## 2015-11-11 LAB — ECHOCARDIOGRAM LIMITED
Ao-asc: 35 cm
FS: 34 % (ref 28–44)
IVS/LV PW RATIO, ED: 1.57
LA ID, A-P, ES: 46 mm
LA diam end sys: 46 mm
LA diam index: 1.81 cm/m2
LV PW d: 12.1 mm — AB (ref 0.6–1.1)

## 2015-11-11 MED ORDER — PERFLUTREN LIPID MICROSPHERE
1.0000 mL | INTRAVENOUS | Status: AC | PRN
  Administered 2015-11-11: 2 mL via INTRAVENOUS

## 2015-11-13 ENCOUNTER — Other Ambulatory Visit (INDEPENDENT_AMBULATORY_CARE_PROVIDER_SITE_OTHER): Payer: Medicare Other

## 2015-11-13 ENCOUNTER — Encounter: Payer: Self-pay | Admitting: Endocrinology

## 2015-11-13 DIAGNOSIS — Z794 Long term (current) use of insulin: Secondary | ICD-10-CM

## 2015-11-13 DIAGNOSIS — E1165 Type 2 diabetes mellitus with hyperglycemia: Secondary | ICD-10-CM | POA: Diagnosis not present

## 2015-11-13 LAB — COMPREHENSIVE METABOLIC PANEL
ALT: 20 U/L (ref 0–53)
AST: 15 U/L (ref 0–37)
Albumin: 3.9 g/dL (ref 3.5–5.2)
Alkaline Phosphatase: 95 U/L (ref 39–117)
BUN: 51 mg/dL — ABNORMAL HIGH (ref 6–23)
CO2: 31 mEq/L (ref 19–32)
Calcium: 9.7 mg/dL (ref 8.4–10.5)
Chloride: 104 mEq/L (ref 96–112)
Creatinine, Ser: 2.1 mg/dL — ABNORMAL HIGH (ref 0.40–1.50)
GFR: 32.87 mL/min — ABNORMAL LOW (ref >60.00)
Glucose, Bld: 143 mg/dL — ABNORMAL HIGH (ref 70–99)
Potassium: 4.7 mEq/L (ref 3.5–5.1)
Sodium: 139 mEq/L (ref 135–145)
Total Bilirubin: 0.4 mg/dL (ref 0.2–1.2)
Total Protein: 7 g/dL (ref 6.0–8.3)

## 2015-11-13 LAB — LIPID PANEL
Cholesterol: 158 mg/dL (ref 0–200)
HDL: 22 mg/dL — ABNORMAL LOW (ref >39.00)
NonHDL: 136.28
Total CHOL/HDL Ratio: 7
Triglycerides: 302 mg/dL — ABNORMAL HIGH (ref 0.0–149.0)
VLDL: 60.4 mg/dL — ABNORMAL HIGH (ref 0.0–40.0)

## 2015-11-13 LAB — HEMOGLOBIN A1C: Hgb A1c MFr Bld: 8.4 % — ABNORMAL HIGH (ref 4.6–6.5)

## 2015-11-13 LAB — LDL CHOLESTEROL, DIRECT: Direct LDL: 68 mg/dL

## 2015-11-18 ENCOUNTER — Encounter: Payer: Self-pay | Admitting: Endocrinology

## 2015-11-18 ENCOUNTER — Ambulatory Visit (INDEPENDENT_AMBULATORY_CARE_PROVIDER_SITE_OTHER): Payer: Medicare Other | Admitting: Endocrinology

## 2015-11-18 ENCOUNTER — Other Ambulatory Visit: Payer: Self-pay | Admitting: Endocrinology

## 2015-11-18 VITALS — BP 118/50 | HR 84 | Ht 76.0 in | Wt 286.0 lb

## 2015-11-18 DIAGNOSIS — Z794 Long term (current) use of insulin: Secondary | ICD-10-CM

## 2015-11-18 DIAGNOSIS — I2510 Atherosclerotic heart disease of native coronary artery without angina pectoris: Secondary | ICD-10-CM | POA: Diagnosis not present

## 2015-11-18 DIAGNOSIS — E1165 Type 2 diabetes mellitus with hyperglycemia: Secondary | ICD-10-CM

## 2015-11-18 DIAGNOSIS — I10 Essential (primary) hypertension: Secondary | ICD-10-CM | POA: Diagnosis not present

## 2015-11-18 NOTE — Patient Instructions (Signed)
Low fat meals  More sugars at bedtime

## 2015-11-18 NOTE — Progress Notes (Signed)
Patient ID: David [name], male   DOB: 01-Dec-1940, 75 y.o.   MRN: [id number]    Reason for Appointment: Followup for Type 2 Diabetes  History of Present Illness:          Diagnosis: Type 2 diabetes mellitus, date of diagnosis:   1992       Past history:  He was initially treated with metformin and at some point also glipizide. His previous records from out of town are not available and no information is available about his level of control Apparently he was started on insulin in 1994 approximately because of poor control He has been on various insulin regimens over the last several years However even with insulin he has had poor control for at least the last 7 or 8 years. He does not know what his previous A1c levels have been. He had been continued on metformin and glipizide but metformin stopped about 2 years ago because of kidney function abnormality He had been taking Lantus 60 units twice a day with NovoLog previously and also Before his initial consultation in 6/15 he was on NovoLog twice a day and Humalog mix insulin  Because of poor control and large insulin doses he was started on Victoza in 6/15  Recent history:   INSULIN PUMP  regimen is as follows   Basal rates:Midnight = 2.9, 6 AM = 3.55, 1 PM = 3.8 and 10 PM = 3.0  Carbohydrate coverage 1:7 for lunch otherwise 1:5, correction 1:10 with target 120-150  Average total daily insulin 89 +/-4 units, basal insulin is 79 %  Since 04/28/14 he has been on a Medtronic insulin pump because of persistent poor control and high insulin requirement  Initially with starting the pump his blood sugars had been significantly better but subsequently blood sugars have been again high with A1c again  over 8%  Current management, problems and blood sugar patterns:  He did not make the changes in his basal rate in the morning as instructed on the last visit  His blood sugars are higher overall essentially  HIGHEST blood  sugars are in the morning  After his first meal blood sugars are somewhat lower  Blood sugars are somewhat better late afternoon and suppertime  He does not check readings after his evening meal and not clear he is getting enough boluses  He has gained weight, does not appear to be distracting fat intake and diet consistently especially with meats  He is  taking Victoza  regularly  Not able to do any exercise       Oral hypoglycemic drugs the patient is taking are: None, has renal dysfunction        Glucose monitoring:  done about 3 times a day         Glucometer:  contour     Blood Glucose readings from pump download:  Mean values apply above for all meters except median for One Touch  PRE-MEAL Fasting Lunch Dinner Bedtime Overall  Glucose range: 149-315  146-256  152-335     Mean/median: 241  231  197   205+/-52    Hypoglycemia: None   Glycemic control:   Lab Results  Component Value Date   HGBA1C 8.4* 11/13/2015   HGBA1C 8.3* 08/11/2015   HGBA1C 8.8* 05/15/2015   Lab Results  Component Value Date   MICROALBUR 9.2* 05/15/2015   LDLCALC 49 10/29/2013   CREATININE 2.10* 11/13/2015    Self-care: The diet that the patient has  been following [id number]  Meals: 2- 3 meals per day.breakfast is either cereal or eggs and toast, lunch is usually a sandwich , dinner at  6-7 pm.   He thinks his portions are small but periodically eats sweets.  Eating hamburger regularly Bedtime snack is usually crackers and fruit          Exercise:  not doing exercise bike, previously  was doing 15-30 min        Dietician visit: Most recent: several years ago.               Compliance with the medical regimen: good Retinal exam: Most recent: 05/2013.    Weight history:  Wt Readings from Last 3 Encounters:  11/18/15 286 lb (129.729 kg)  10/19/15 275 lb 3.2 oz (124.83 kg)  08/14/15 279 lb 9.6 oz (126.826 kg)        Medication List       This list is accurate as of: 11/18/15  3:14 PM.   Always use your most recent med list.               acetaminophen 500 MG tablet  Commonly known as:  TYLENOL  Take 1,000 mg by mouth every 6 (six) hours as needed for fever. Reported on 06/17/2015     aspirin EC 325 MG tablet  Take 1 tablet (325 mg total) by mouth daily.     BAYER CONTOUR NEXT TEST test strip  Generic drug:  glucose blood  USE AS INSTRUCTED TO CHECK BLOOD SUGAR 6 TIMES PER DAY     calcitRIOL 0.25 MCG capsule  Commonly known as:  ROCALTROL  Take 0.25 mcg by mouth daily.     carvedilol 25 MG tablet  Commonly known as:  COREG  Take 1 tablet (25 mg total) by mouth 2 (two) times daily with a meal.     doxazosin 4 MG tablet  Commonly known as:  CARDURA  Take 1 tablet (4 mg total) by mouth daily.     doxazosin 4 MG tablet  Commonly known as:  CARDURA  TAKE ONE TABLET BY MOUTH ONCE DAILY     Fish Oil 1000 MG Caps  Take by mouth 2 (two) times daily.     hydrocortisone cream 1 %  Apply to affected area 2 times daily     insulin aspart 100 UNIT/ML injection  Commonly known as:  NOVOLOG  Use max 120 units per day with CONTINUOUS  insulin pump. DX CODE E11.49     isosorbide mononitrate 30 MG 24 hr tablet  Commonly known as:  IMDUR  TAKE TWO TABLETS BY MOUTH ONCE DAILY     isosorbide mononitrate 30 MG 24 hr tablet  Commonly known as:  IMDUR  TAKE TWO TABLETS BY MOUTH ONCE DAILY     lisinopril 5 MG tablet  Commonly known as:  PRINIVIL,ZESTRIL  Take 5 mg by mouth daily.     pantoprazole 40 MG tablet  Commonly known as:  PROTONIX  Take 1 tablet (40 mg total) by mouth daily.     sertraline 50 MG tablet  Commonly known as:  ZOLOFT  Take 1 tablet (50 mg total) by mouth daily.     torsemide 20 MG tablet  Commonly known as:  DEMADEX  TAKE TWO TABLETS BY MOUTH  DAILY     traMADol 50 MG tablet  Commonly known as:  ULTRAM  Take 1 tablet (50 mg total) by mouth every 6 (six) hours as needed.  VICTOZA 18 MG/3ML Sopn  Generic drug:  Liraglutide  INJECT  0.3MLS (1.8 MG TOTAL) INTO THE SKIN DAILY        Allergies: No Known Allergies  Past Medical History  Diagnosis Date  . Hypertension   . Arthritis   . Anxiety   . Sinus node dysfunction (HCC)   . Cardiac pacemaker in situ   . CHF (congestive heart failure) ([place])   . CAD (coronary artery disease)     Nonobstructive CAD per cath  . Dyspnea   . Insulin dependent type 2 diabetes mellitus ([place]) 1991    followd by [name]--  has insulin pump  . Insulin pump in place   . Anemia, iron deficiency   . Secondary hyperparathyroidism of renal origin ([place])   . 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 History of skin cancer   . Chronic ulcer of right foot ([place])   . History of cellulitis     right great toe 10-25-2014  . BPH (benign prostatic hypertrophy)   . Peripheral neuropathy (HCC)     severe  . Peripheral vascular disease ([name])     bilateral lower extremities  . OSA on CPAP     Past Surgical History  Procedure Laterality Date  . Vein ligation and stripping    . 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 Orif ankle fracture Left 11/06/2014    Procedure: OPEN REDUCTION INTERNAL FIXATION (ORIF) LEFT  ANKLE FRACTURE;  Surgeon: [name], MD;  Location: [facility];  Service: Orthopedics;  Laterality: Left;  . Cardiac catheterization  11-25-2010   [place], [place]    Nonobstructive CAD  . Cardiac pacemaker placement  Nov 2009    Medtronic  . Total knee arthroplasty    . Excision bone cyst Right 03/06/2015    Procedure: BONE BIOPSIES OF RIGHT FOOT;  Surgeon: [name], MD;  Location: [facility];  Service: Podiatry;  Laterality: Right;  . Amputation toe Right 03/12/2015    Procedure: RIGHT HALLUS AMPUTATION ;  Surgeon: [name], MD;  Location: [facility];  Service: Podiatry;  Laterality: Right;  . Ep implantable device N/A 06/03/2015     Procedure: PPM Generator Changeout;  Surgeon: [name], MD;  Location: [facility];  Service: Cardiovascular;  Laterality: N/A;    Family History  Problem Relation Age of Onset  . Cancer Mother     breast  . Heart attack Father     Social History:  reports that he quit smoking about 31 years ago. He has never used smokeless tobacco. He reports that he drinks about 0.6 oz of alcohol per week. He reports that he does not use illicit drugs.    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 Also had taken Gembrozil for several years without apparent side effects.  Both of these were  stopped because of increased liver functions and LFT has been back to normal LDL has not been high subsequently but HDL is low  Currently taking only fish oil and LDL is within target but triglycerides are Nearly 300       Lab Results  Component Value  Date   CHOL 158 11/13/2015   HDL 22.00* 11/13/2015   LDLCALC 49 10/29/2013   LDLDIRECT 68.0 11/13/2015   TRIG 302.0* 11/13/2015   CHOLHDL 7 11/13/2015      HYPERTENSION: The blood pressure has been treated with various drugs.   Currently taking carvedilol, lisinopril  He was having lightheadedness and his doxazosin was stopped on the last visit. No further symptoms   Chronic kidney disease: Etiology unknown, is followed by nephrologist  Creatinine level has been variable Last creatinine:  Lab Results  Component Value Date   CREATININE 2.10* 11/13/2015        Has history of Numbness in his feet  since about 2013, no tingling or burning in feet    Previous foot exam findings:   Vibration sense is absent in toes. Ankle jerks are absent bilaterally.          Diabetic foot exam:  No callus formation. Mild denudation of skin on left second toe superiorly Amputation of right third  toe. Thickening and dryness of skin on the toes on the right Absent monofilament sensation in the feet Absent pedal pulses     Physical Examination:  BP 118/50 mmHg  Pulse 84  Ht 6\' 4"  (1.93 m)  Wt 286 lb (129.729 kg)  BMI 34.83 kg/m2  SpO2 97% 1+ lower leg edema present  ASSESSMENT:  Diabetes type 2, uncontrolled with obesity  His blood sugars are overall still difficult to control despite nearly 100 units of insulin a day with this pump and also using Victoza  A1c over 8% persistently See history of present illness for detailed discussion of his current management, blood sugar patterns and problems identified  He is still getting high readings on waking up and not clear if this is related to inadequate overnight insulin or eating higher fat meals at night Also not able to lose weight, weight has increased somewhat  PLAN:   Pump settings will be changed  with basal 3.0 at midnight, 3.8 at 6 AM, 3.9 at 1 PM and continue 3.0 at 10 PM  To check more blood sugars after evening meal  Call if blood sugars are not consistently controlled  Given him list of high-fat foods to avoid including fatty meats and hamburgers  Take Victoza regularly  Encouraged him to be more active with walking for weight loss   Hypertension: Blood pressure is still low normal despite stopping doxazosin, followed by PCP Currently not symptomatic and will continue same regimen  EDEMA: Follow-up with nephrologist   Counseling time on subjects discussed above is over 50% of today's 25 minute visit   Patient Instructions  Low fat meals  More sugars at bedtime    Caro Brundidge 11/18/2015, 3:14 PM   Note: This office note was prepared with Dragon voice recognition system technology. Any transcriptional errors that result from this process are unintentional.

## 2015-11-25 ENCOUNTER — Other Ambulatory Visit: Payer: Self-pay

## 2015-11-25 MED ORDER — GLUCOSE BLOOD VI STRP: ORAL_STRIP | Status: DC

## 2015-12-02 DIAGNOSIS — H43813 Vitreous degeneration, bilateral: Secondary | ICD-10-CM | POA: Diagnosis not present

## 2015-12-02 DIAGNOSIS — H35373 Puckering of macula, bilateral: Secondary | ICD-10-CM | POA: Diagnosis not present

## 2015-12-02 DIAGNOSIS — E113313 Type 2 diabetes mellitus with moderate nonproliferative diabetic retinopathy with macular edema, bilateral: Secondary | ICD-10-CM | POA: Diagnosis not present

## 2015-12-02 LAB — HM DIABETES EYE EXAM

## 2015-12-14 ENCOUNTER — Ambulatory Visit (INDEPENDENT_AMBULATORY_CARE_PROVIDER_SITE_OTHER): Payer: Medicare Other | Admitting: *Deleted

## 2015-12-14 ENCOUNTER — Other Ambulatory Visit: Payer: Self-pay

## 2015-12-14 DIAGNOSIS — I495 Sick sinus syndrome: Secondary | ICD-10-CM | POA: Diagnosis not present

## 2015-12-14 MED ORDER — GLUCOSE BLOOD VI STRP: ORAL_STRIP | 2 refills | Status: DC

## 2015-12-14 MED ORDER — ACCU-CHEK FASTCLIX LANCETS MISC: 2 refills | Status: DC

## 2015-12-14 MED ORDER — ACCU-CHEK AVIVA DEVI: 0 refills | Status: DC

## 2015-12-14 NOTE — Progress Notes (Signed)
Remote pacemaker transmission.   

## 2015-12-15 ENCOUNTER — Encounter: Payer: Self-pay | Admitting: Cardiology

## 2015-12-17 LAB — CUP PACEART REMOTE DEVICE CHECK
Battery Remaining Longevity: 90 mo
Battery Voltage: 3.01 V
Brady Statistic AP VP Percent: 0.05 %
Brady Statistic AP VS Percent: 99.39 %
Brady Statistic AS VP Percent: 0 %
Brady Statistic AS VS Percent: 0.56 %
Brady Statistic RA Percent Paced: 99.44 %
Brady Statistic RV Percent Paced: 0.05 %
Date Time Interrogation Session: 20170731150806
Implantable Lead Implant Date: 20091102
Implantable Lead Implant Date: 20091102
Implantable Lead Location: 753859
Implantable Lead Location: 753860
Implantable Lead Model: 5076
Implantable Lead Model: 5076
Lead Channel Impedance Value: 399 Ohm
Lead Channel Impedance Value: 399 Ohm
Lead Channel Impedance Value: 494 Ohm
Lead Channel Impedance Value: 532 Ohm
Lead Channel Pacing Threshold Amplitude: 1 V
Lead Channel Pacing Threshold Amplitude: 2.5 V
Lead Channel Pacing Threshold Pulse Width: 0.4 ms
Lead Channel Pacing Threshold Pulse Width: 0.4 ms
Lead Channel Sensing Intrinsic Amplitude: 1.875 mV
Lead Channel Sensing Intrinsic Amplitude: 1.875 mV
Lead Channel Sensing Intrinsic Amplitude: 19 mV
Lead Channel Sensing Intrinsic Amplitude: 19 mV
Lead Channel Setting Pacing Amplitude: 2.5 V
Lead Channel Setting Pacing Amplitude: 2.5 V
Lead Channel Setting Pacing Pulse Width: 0.4 ms
Lead Channel Setting Sensing Sensitivity: 2.8 mV

## 2015-12-23 DIAGNOSIS — E1351 Other specified diabetes mellitus with diabetic peripheral angiopathy without gangrene: Secondary | ICD-10-CM | POA: Diagnosis not present

## 2015-12-23 DIAGNOSIS — L84 Corns and callosities: Secondary | ICD-10-CM | POA: Diagnosis not present

## 2015-12-23 DIAGNOSIS — L97511 Non-pressure chronic ulcer of other part of right foot limited to breakdown of skin: Secondary | ICD-10-CM | POA: Diagnosis not present

## 2015-12-23 DIAGNOSIS — L602 Onychogryphosis: Secondary | ICD-10-CM | POA: Diagnosis not present

## 2015-12-23 DIAGNOSIS — I70293 Other atherosclerosis of native arteries of extremities, bilateral legs: Secondary | ICD-10-CM | POA: Diagnosis not present

## 2015-12-25 ENCOUNTER — Other Ambulatory Visit: Payer: Self-pay

## 2015-12-25 MED ORDER — ACCU-CHEK SOFTCLIX LANCETS MISC: 2 refills | Status: DC

## 2015-12-30 ENCOUNTER — Ambulatory Visit (INDEPENDENT_AMBULATORY_CARE_PROVIDER_SITE_OTHER): Payer: Medicare Other | Admitting: Endocrinology

## 2015-12-30 ENCOUNTER — Encounter: Payer: Self-pay | Admitting: Endocrinology

## 2015-12-30 VITALS — BP 92/43 | HR 83 | Ht 76.0 in | Wt 282.5 lb

## 2015-12-30 DIAGNOSIS — Z794 Long term (current) use of insulin: Secondary | ICD-10-CM | POA: Diagnosis not present

## 2015-12-30 DIAGNOSIS — I952 Hypotension due to drugs: Secondary | ICD-10-CM

## 2015-12-30 DIAGNOSIS — I2510 Atherosclerotic heart disease of native coronary artery without angina pectoris: Secondary | ICD-10-CM

## 2015-12-30 DIAGNOSIS — E1165 Type 2 diabetes mellitus with hyperglycemia: Secondary | ICD-10-CM | POA: Diagnosis not present

## 2015-12-30 NOTE — Patient Instructions (Signed)
Some sugars at bedtime  Bolus for all meals and carbs

## 2015-12-30 NOTE — Progress Notes (Signed)
Patient ID: David [name], male   DOB: [birth year], 75 y.o.   MRN: [id number]    Reason for Appointment: Followup for Type 2 Diabetes  History of Present Illness:          Diagnosis: Type 2 diabetes mellitus, date of diagnosis:   1992       Past history:  He was initially treated with metformin and at some point also glipizide. His previous records from out of town are not available and no information is available about his level of control Apparently he was started on insulin in 1994 approximately because of poor control He has been on various insulin regimens over the last several years However even with insulin he has had poor control for at least the last 7 or 8 years. He does not know what his previous A1c levels have been. He had been continued on metformin and glipizide but metformin stopped about 2 years ago because of kidney function abnormality He had been taking Lantus 60 units twice a day with NovoLog previously and also Before his initial consultation in 6/15 he was on NovoLog twice a day and Humalog mix insulin  Because of poor control and large insulin doses he was started on Victoza in 6/15  Recent history:   INSULIN PUMP  regimen is as follows   Basal rates 3.0 at midnight, 3.8 at 6 AM, 3.9 at 1 PM and 3.0 at 10 PM  Carbohydrate coverage 1:7 for lunch otherwise 1:5, correction 1:10 with target 120-150  Average total daily insulin 89 +/-4 units, basal insulin is 89 %  Since 04/28/14 he has been on a Medtronic insulin pump because of persistent poor control and high insulin requirement  Initially with starting the pump his blood sugars had been significantly better but subsequently blood sugars have been again high with A1c consistently  over 8%  Current management, problems and blood sugar patterns:  He had a change in his basal rates on the last visit with increased overnight and morning basal rates  Overall blood sugars are better  However he still  has relatively high readings most of the time with sporadic good readings in the mornings and afternoons  Despite reminders he does not check his blood sugars after evening meal or 2 hours after eating  BOLUSES: He is missing doses in the evenings periodically.  Apparently he did not have supplies for his pump for 3-4 days and used NovoLog injections but not clear when he did this.  His blood sugars are higher overall essentially  HIGHEST blood sugars are in the morning and midday  He has gained weight, he thinks he is cutting back on fatty meats as directed on the last visit but not clear why he has gained weight  He has started walking up to 4 blocks at a time  He is  taking Victoza  regularly       Oral hypoglycemic drugs the patient is taking are: None, has renal dysfunction        Glucose monitoring:  done about 3 times a day         Glucometer:  contour     Blood Glucose readings from pump download:  Mean values apply above for all meters except median for One Touch  PRE-MEAL Fasting Lunch Dinner Bedtime Overall  Glucose range: 86-236  115-176  110-209     Mean/median: 180     170   Hypoglycemia: None   Glycemic control:  Lab Results  Component Value Date   HGBA1C 8.4 (H) 11/13/2015   HGBA1C 8.3 (H) 08/11/2015   HGBA1C 8.8 (H) 05/15/2015   Lab Results  Component Value Date   MICROALBUR 9.2 (H) 05/15/2015   LDLCALC 49 10/29/2013   CREATININE 2.10 (H) 11/13/2015    Self-care: The diet that the patient has been following [id number]  Meals: 2- 3 meals per day.breakfast is either cereal or eggs and toast, lunch is usually a sandwich , dinner at  6-7 pm.   He thinks his portions are small but periodically eats sweets.  Eating chicken More regularly Bedtime snack is usually crackers and fruit          Exercise:  Walking up to 4 blocks a few days a week, not doing exercise bike    Dietician visit: Most recent: several years ago.               Compliance with the  medical regimen: good Retinal exam: Most recent: 05/2013.    Weight history:  Wt Readings from Last 3 Encounters:  12/30/15 282 lb 8 oz (128.1 kg)  11/18/15 286 lb (129.7 kg)  10/19/15 275 lb 3.2 oz (124.8 kg)        Medication List       Accurate as of 12/30/15  3:02 PM. Always use your most recent med list.          ACCU-CHEK AVIVA device USE AS INSTRUCTED TO CHECK BLOOD SUGAR 6 TIMES PER DAY Dx: Code E11.9   ACCU-CHEK SOFTCLIX LANCETS lancets USE AS INSTRUCTED TO CHECK BLOOD SUGAR 6 TIMES PER DAY Dx: Code E11.9   aspirin EC 325 MG tablet Take 1 tablet (325 mg total) by mouth daily.   calcitRIOL 0.25 MCG capsule Commonly known as:  ROCALTROL Take 0.25 mcg by mouth daily.   carvedilol 25 MG tablet Commonly known as:  COREG Take 1 tablet (25 mg total) by mouth 2 (two) times daily with a meal.   doxazosin 4 MG tablet Commonly known as:  CARDURA TAKE ONE TABLET BY MOUTH ONCE DAILY   Fish Oil 1000 MG Caps Take by mouth 2 (two) times daily.   glucose blood test strip Commonly known as:  ACCU-CHEK AVIVA USE AS INSTRUCTED TO CHECK BLOOD SUGAR 6 TIMES PER DAY Dx: Code E11.9   hydrocortisone cream 1 % Apply to affected area 2 times daily   insulin aspart 100 UNIT/ML injection Commonly known as:  NOVOLOG Use max 120 units per day with CONTINUOUS  insulin pump. DX CODE E11.49   isosorbide mononitrate 30 MG 24 hr tablet Commonly known as:  IMDUR TAKE TWO TABLETS BY MOUTH ONCE DAILY   lisinopril 5 MG tablet Commonly known as:  PRINIVIL,ZESTRIL Take 5 mg by mouth daily.   pantoprazole 40 MG tablet Commonly known as:  PROTONIX Take 1 tablet (40 mg total) by mouth daily.   sertraline 50 MG tablet Commonly known as:  ZOLOFT Take 1 tablet (50 mg total) by mouth daily.   torsemide 20 MG tablet Commonly known as:  DEMADEX TAKE TWO TABLETS BY MOUTH  DAILY   traMADol 50 MG tablet Commonly known as:  ULTRAM Take 1 tablet (50 mg total) by mouth every 6 (six)  hours as needed.   VICTOZA 18 MG/3ML Sopn Generic drug:  Liraglutide INJECT 0.3MLS (1.8 MG TOTAL) INTO THE SKIN DAILY       Allergies: No Known Allergies  Past Medical History:  Diagnosis Date  . Anemia, iron deficiency   .  Anxiety   . Arthritis   . BPH (benign prostatic hypertrophy)   . CAD (coronary artery disease)    Nonobstructive CAD per cath  . Cardiac pacemaker in situ   . CHF (congestive heart failure) ([place])   . Chronic ulcer of right foot ([place])   . 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 Dyspnea   . History of cellulitis    right great toe 10-25-2014  . History of skin cancer   . Hypertension   . Insulin dependent type 2 diabetes mellitus ([place]) 1991   followd by [name]--  has insulin pump  . Insulin pump in place   . OSA on CPAP   . Peripheral neuropathy (HCC)    severe  . Peripheral vascular disease ([place])    bilateral lower extremities  . Secondary hyperparathyroidism of renal origin ([place])   . Sinus node dysfunction [facility])     Past Surgical History:  Procedure Laterality Date  . AMPUTATION OF REPLICATED TOES  Mar 0000000   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)  . AMPUTATION TOE Right 03/12/2015   Procedure: RIGHT HALLUS AMPUTATION ;  Surgeon: [name], MD;  Location: [place];  Service: Podiatry;  Laterality: Right;  . CARDIAC CATHETERIZATION  11-25-2010   [place], [place]   Nonobstructive CAD  . CARDIAC PACEMAKER PLACEMENT  Nov 2009   Medtronic  . EP IMPLANTABLE DEVICE N/A 06/03/2015   Procedure: PPM Generator Changeout;  Surgeon: [name], MD;  Location: [place] [facility];  Service: Cardiovascular;  Laterality: N/A;  . EXCISION BONE CYST Right 03/06/2015   Procedure: BONE BIOPSIES OF RIGHT FOOT;  Surgeon: [name], MD;  Location: [place];  Service: Podiatry;  Laterality: Right;  . ORIF ANKLE FRACTURE Left 11/06/2014   Procedure: OPEN REDUCTION INTERNAL  FIXATION (ORIF) LEFT  ANKLE FRACTURE;  Surgeon: [name], MD;  Location: [place];  Service: Orthopedics;  Laterality: Left;  . TOTAL KNEE ARTHROPLASTY    . VEIN LIGATION AND STRIPPING      Family History  Problem Relation Age of Onset  . Cancer Mother     breast  . Heart attack Father     Social History:  reports that he quit smoking about 31 years ago. He has a 60.00 pack-year smoking history. He has never used smokeless tobacco. He reports that he drinks about 0.6 oz of alcohol per week . He reports that he does not use drugs.    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 Also had taken Gembrozil for several years without apparent side effects.  Both of these were  stopped because of increased liver functions and LFT has been back to normal LDL has not been high subsequently but HDL is low  Currently taking only fish oil and LDL is within target but triglycerides are Nearly 300       Lab Results  Component Value Date   CHOL 158 11/13/2015   HDL 22.00 (L) 11/13/2015   LDLCALC 49 10/29/2013   LDLDIRECT 68.0 11/13/2015   TRIG 302.0 (H) 11/13/2015   CHOLHDL 7 11/13/2015      HYPERTENSION: The blood pressure has been treated with various drugs.   Currently taking  carvedilol, lisinopril  He was having lightheadedness and his doxazosin was stopped Previously, not clear if he is taking it now No further dizziness   Chronic kidney disease: Etiology unknown, is followed by nephrologist  Creatinine level has been variable Last creatinine:  Lab Results  Component Value Date   CREATININE 2.10 (H) 11/13/2015        Has history of Numbness in his feet  since about 2013, no tingling or burning in feet    He has been wearing diabetic shoes.  He has been followed by podiatrist, has a couple of small  blisters recently  Previous foot exam findings From 02/2015:   Vibration sense is absent in toes. Ankle jerks are absent bilaterally.          Diabetic foot exam:  No callus formation. Mild denudation of skin on left second toe superiorly Amputation of right third toe. Thickening and dryness of skin on the toes on the right Absent monofilament sensation in the feet Absent pedal pulses     Physical Examination:  BP (!) 92/43   Pulse 83   Ht 6\' 4"  (1.93 m)   Wt 282 lb 8 oz (128.1 kg)   BMI 34.39 kg/m   1+ lower leg edema present  ASSESSMENT:  Diabetes type 2, uncontrolled with obesity See history of present illness for detailed discussion of current diabetes management, blood sugar patterns and problems identified   His blood sugars are overall still difficult to control although somewhat better on his recent download He is however continuing to gain  weight  A1c over 8% persistently in the past  PLAN:   Pump settings will be unchanged    Carb ratio for suppertime will be between 6 PM-10 PM instead of until late 30 p.m.  To check more blood sugars after evening meal or at bedtime, written instructions given  Keep portions and high carbohydrate foods reduced in amount  Bolus consistently at all meals and snacks  Call if blood sugars are unusually high or low  A1c in 3 months   Hypertension: Blood pressure is still low normal, followed by PCP Currently not symptomatic    Counseling time on subjects discussed above is over 50% of today's 25 minute visit   Patient Instructions  Some sugars at bedtime  Bolus for all meals and carbs    Reade Trefz 12/30/2015, 3:02 PM   Note: This office note was prepared with Dragon voice recognition system technology. Any transcriptional errors that result from this process are unintentional.

## 2016-01-05 ENCOUNTER — Encounter: Payer: Self-pay | Admitting: Family

## 2016-01-05 DIAGNOSIS — I70293 Other atherosclerosis of native arteries of extremities, bilateral legs: Secondary | ICD-10-CM | POA: Diagnosis not present

## 2016-01-05 DIAGNOSIS — L97511 Non-pressure chronic ulcer of other part of right foot limited to breakdown of skin: Secondary | ICD-10-CM | POA: Diagnosis not present

## 2016-01-05 DIAGNOSIS — E1351 Other specified diabetes mellitus with diabetic peripheral angiopathy without gangrene: Secondary | ICD-10-CM | POA: Diagnosis not present

## 2016-01-05 DIAGNOSIS — L602 Onychogryphosis: Secondary | ICD-10-CM | POA: Diagnosis not present

## 2016-01-14 ENCOUNTER — Other Ambulatory Visit: Payer: Self-pay | Admitting: Endocrinology

## 2016-01-15 ENCOUNTER — Other Ambulatory Visit: Payer: Self-pay

## 2016-01-15 MED ORDER — INSULIN ASPART 100 UNIT/ML ~~LOC~~ SOLN: SUBCUTANEOUS | 5 refills | Status: DC

## 2016-01-20 DIAGNOSIS — L97511 Non-pressure chronic ulcer of other part of right foot limited to breakdown of skin: Secondary | ICD-10-CM | POA: Diagnosis not present

## 2016-01-20 DIAGNOSIS — I70293 Other atherosclerosis of native arteries of extremities, bilateral legs: Secondary | ICD-10-CM | POA: Diagnosis not present

## 2016-01-20 DIAGNOSIS — E1351 Other specified diabetes mellitus with diabetic peripheral angiopathy without gangrene: Secondary | ICD-10-CM | POA: Diagnosis not present

## 2016-01-21 ENCOUNTER — Encounter: Payer: Self-pay | Admitting: *Deleted

## 2016-02-03 DIAGNOSIS — E1351 Other specified diabetes mellitus with diabetic peripheral angiopathy without gangrene: Secondary | ICD-10-CM | POA: Diagnosis not present

## 2016-02-03 DIAGNOSIS — I70293 Other atherosclerosis of native arteries of extremities, bilateral legs: Secondary | ICD-10-CM | POA: Diagnosis not present

## 2016-02-03 DIAGNOSIS — L97511 Non-pressure chronic ulcer of other part of right foot limited to breakdown of skin: Secondary | ICD-10-CM | POA: Diagnosis not present

## 2016-02-05 ENCOUNTER — Other Ambulatory Visit: Payer: Self-pay | Admitting: *Deleted

## 2016-02-05 MED ORDER — GLUCOSE BLOOD VI STRP: ORAL_STRIP | 2 refills | Status: DC

## 2016-02-08 DIAGNOSIS — I129 Hypertensive chronic kidney disease with stage 1 through stage 4 chronic kidney disease, or unspecified chronic kidney disease: Secondary | ICD-10-CM | POA: Diagnosis not present

## 2016-02-08 DIAGNOSIS — M869 Osteomyelitis, unspecified: Secondary | ICD-10-CM | POA: Diagnosis not present

## 2016-02-08 DIAGNOSIS — N401 Enlarged prostate with lower urinary tract symptoms: Secondary | ICD-10-CM | POA: Diagnosis not present

## 2016-02-08 DIAGNOSIS — E875 Hyperkalemia: Secondary | ICD-10-CM | POA: Diagnosis not present

## 2016-02-08 DIAGNOSIS — N183 Chronic kidney disease, stage 3 (moderate): Secondary | ICD-10-CM | POA: Diagnosis not present

## 2016-02-08 DIAGNOSIS — E1129 Type 2 diabetes mellitus with other diabetic kidney complication: Secondary | ICD-10-CM | POA: Diagnosis not present

## 2016-02-08 DIAGNOSIS — N2581 Secondary hyperparathyroidism of renal origin: Secondary | ICD-10-CM | POA: Diagnosis not present

## 2016-02-08 DIAGNOSIS — R809 Proteinuria, unspecified: Secondary | ICD-10-CM | POA: Diagnosis not present

## 2016-02-08 DIAGNOSIS — D509 Iron deficiency anemia, unspecified: Secondary | ICD-10-CM | POA: Diagnosis not present

## 2016-02-08 DIAGNOSIS — R3911 Hesitancy of micturition: Secondary | ICD-10-CM | POA: Diagnosis not present

## 2016-02-09 ENCOUNTER — Other Ambulatory Visit: Payer: Self-pay | Admitting: *Deleted

## 2016-02-09 MED ORDER — LIRAGLUTIDE 18 MG/3ML ~~LOC~~ SOPN: PEN_INJECTOR | SUBCUTANEOUS | 3 refills | Status: DC

## 2016-02-11 ENCOUNTER — Other Ambulatory Visit: Payer: Self-pay | Admitting: Family

## 2016-02-17 DIAGNOSIS — I70293 Other atherosclerosis of native arteries of extremities, bilateral legs: Secondary | ICD-10-CM | POA: Diagnosis not present

## 2016-02-17 DIAGNOSIS — E1351 Other specified diabetes mellitus with diabetic peripheral angiopathy without gangrene: Secondary | ICD-10-CM | POA: Diagnosis not present

## 2016-02-17 DIAGNOSIS — L97511 Non-pressure chronic ulcer of other part of right foot limited to breakdown of skin: Secondary | ICD-10-CM | POA: Diagnosis not present

## 2016-02-18 ENCOUNTER — Encounter: Payer: Self-pay | Admitting: *Deleted

## 2016-02-23 ENCOUNTER — Ambulatory Visit (INDEPENDENT_AMBULATORY_CARE_PROVIDER_SITE_OTHER): Payer: Medicare Other | Admitting: Internal Medicine

## 2016-02-23 ENCOUNTER — Encounter: Payer: Self-pay | Admitting: Internal Medicine

## 2016-02-23 VITALS — BP 138/76 | HR 90 | Ht 76.0 in | Wt 281.6 lb

## 2016-02-23 DIAGNOSIS — I2510 Atherosclerotic heart disease of native coronary artery without angina pectoris: Secondary | ICD-10-CM

## 2016-02-23 DIAGNOSIS — Z950 Presence of cardiac pacemaker: Secondary | ICD-10-CM

## 2016-02-23 DIAGNOSIS — I495 Sick sinus syndrome: Secondary | ICD-10-CM | POA: Diagnosis not present

## 2016-02-23 NOTE — Progress Notes (Signed)
Patient Care Team: [name], FNP as PCP - General (Family Medicine) [name], MD as Consulting Physician (Podiatry) [name], MD as Consulting Physician (Nephrology)   HPI  David [name] is a 75 y.o. male Seen in followup for pacemaker implanted for sinus node dysfunction and dyspnea on exertion. Cardiac-wise exhibited stable. He denies chest pain or limiting shortness of breath. There is mild edema.  Echocardiogram 6/15 normal LV function and severe left ventricular hypertrophy with left atrial enlargement consistent with hypertensive heart disease  Evaluation [place] included a Myoview that was false positive catheterizations were undertaken in 2009, then, 12 demonstrating 60% of the RCA, 70% in 240% circumflex and 60% OM1 40% LAD.   12/16  Cr 1.78. Potassium level was 4.5 around that same time; he is followed closely by nephrology  6/17 Cr 2.1   ove the last months we have had multiple people look at the echo  I sent the films to Dr. Farrel Conners at [place] concurred with the diagnosis of HCM  There is no family history of sudden death. There is no known history of HCM   Past Medical History:  Diagnosis Date  . Anemia, iron deficiency   . Anxiety   . Arthritis   . BPH (benign prostatic hypertrophy)   . CAD (coronary artery disease)    Nonobstructive CAD per cath  . Cardiac pacemaker in situ   . CHF (congestive heart failure) ([place])   . Chronic ulcer of right foot ([place])   . 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 Dyspnea   . History of cellulitis    right great toe 10-25-2014  . History of skin cancer   . Hypertension   . Insulin dependent type 2 diabetes mellitus ([place]) 1991   followd by [name]--  has insulin pump  . Insulin pump in place   . OSA on CPAP   . Peripheral neuropathy (HCC)    severe  . Peripheral vascular disease ([place])    bilateral lower extremities  . Secondary  hyperparathyroidism of renal origin ([place])   . Sinus node dysfunction [facility])     Past Surgical History:  Procedure Laterality Date  . AMPUTATION OF REPLICATED TOES  Mar 6213   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)  . AMPUTATION TOE Right 03/12/2015   Procedure: RIGHT HALLUS AMPUTATION ;  Surgeon: [name], MD;  Location: [place];  Service: Podiatry;  Laterality: Right;  . CARDIAC CATHETERIZATION  11-25-2010   [place], [place]   Nonobstructive CAD  . CARDIAC PACEMAKER PLACEMENT  Nov 2009   Medtronic  . EP IMPLANTABLE DEVICE N/A 06/03/2015   Procedure: PPM Generator Changeout;  Surgeon: [name], MD;  Location: [place] [facility];  Service: Cardiovascular;  Laterality: N/A;  . EXCISION BONE CYST Right 03/06/2015   Procedure: BONE BIOPSIES OF RIGHT FOOT;  Surgeon: [name], MD;  Location: [place];  Service: Podiatry;  Laterality: Right;  . ORIF ANKLE FRACTURE Left 11/06/2014   Procedure: OPEN REDUCTION INTERNAL FIXATION (ORIF) LEFT  ANKLE FRACTURE;  Surgeon: [name], MD;  Location: [place];  Service: Orthopedics;  Laterality: Left;  . TOTAL KNEE ARTHROPLASTY    . VEIN LIGATION AND STRIPPING      Current Outpatient Prescriptions  Medication Sig Dispense Refill  . ACCU-CHEK SOFTCLIX LANCETS lancets USE AS INSTRUCTED TO CHECK BLOOD SUGAR 6 TIMES PER  DAY Dx: Code E11.9 300 each 2  . aspirin EC 325 MG tablet Take 1 tablet (325 mg total) by mouth daily. 42 tablet 0  . Blood Glucose Monitoring Suppl (ACCU-CHEK AVIVA) device USE AS INSTRUCTED TO CHECK BLOOD SUGAR 6 TIMES PER DAY Dx: Code E11.9 1 each 0  . calcitRIOL (ROCALTROL) 0.25 MCG capsule Take 0.25 mcg by mouth daily.     . carvedilol (COREG) 25 MG tablet Take 1 tablet (25 mg total) by mouth 2 (two) times daily with a meal. 90 tablet 1  . carvedilol (COREG) 25 MG tablet TAKE ONE TABLET BY MOUTH TWICE DAILY WITH A MEAL 90 tablet 1  . doxazosin (CARDURA) 4 MG tablet TAKE ONE TABLET  BY MOUTH ONCE DAILY 90 tablet 1  . glucose blood (ACCU-CHEK AVIVA) test strip USE AS INSTRUCTED TO CHECK BLOOD SUGAR 6 TIMES PER DAY Dx: Code E11.9 300 each 2  . hydrocortisone cream 1 % Apply to affected area 2 times daily 15 g 0  . insulin aspart (NOVOLOG) 100 UNIT/ML injection USE MAX 120 UNITS PER DAY WITH CONTINUOUS INSULIN PUMP. Dx code: E11.49 40 mL 5  . isosorbide mononitrate (IMDUR) 30 MG 24 hr tablet TAKE TWO TABLETS BY MOUTH ONCE DAILY 60 tablet 11  . Liraglutide (VICTOZA) 18 MG/3ML SOPN INJECT 0.3MLS (1.8 MG TOTAL) INTO THE SKIN DAILY 6 pen 3  . lisinopril (PRINIVIL,ZESTRIL) 5 MG tablet Take 5 mg by mouth daily.    . Omega-3 Fatty Acids (FISH OIL) 1000 MG CAPS Take by mouth 2 (two) times daily.    . pantoprazole (PROTONIX) 40 MG tablet Take 1 tablet (40 mg total) by mouth daily. 90 tablet 1  . sertraline (ZOLOFT) 50 MG tablet Take 1 tablet (50 mg total) by mouth daily. 30 tablet 11  . torsemide (DEMADEX) 20 MG tablet TAKE TWO TABLETS BY MOUTH  DAILY 180 tablet 1  . traMADol (ULTRAM) 50 MG tablet Take 1 tablet (50 mg total) by mouth every 6 (six) hours as needed. 20 tablet 0   No current facility-administered medications for this visit.     No Known Allergies  Review of Systems negative except from HPI and PMH  Physical Exam BP 138/76   Pulse 90   Ht 6\' 4"  (1.93 m)   Wt 281 lb 9.6 oz (127.7 kg)   SpO2 94%   BMI 34.28 kg/m  Well developed and well nourished in no acute distress HENT normal E scleral and icterus clear Neck Supples No clubbing cyanosis 1+ Edema   Alert and oriented, grossly normal motor and sensory function Skin Warm and Dry  ECG demonstrates atrial pacing at 64@intervals  24/12/44    Assessment and  Plan Sinus node dysfunction Pacemaker  Medtronic  Renal dysfunction  Hypertension  Sleep apnea-treated Diabetes HCM  We reviewed the physiology and the genetic implications of HCM  I explained why his take residual long time as we have sought  multiple opinions prior to making this diagnosis. Based on Dr. 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 We will be in touch with Dr. Broadus John to pursue genetic testing. We reviewed the alternatives of echocardiographic testing less likely heal given the problem with genotypic and phenotypic mismatch  We also reviewed that in a 75 year old patient there are no indications for risk stratification for him as PROBAND    More than 50% of 45 min was spent in counseling related to the above Normal

## 2016-02-26 LAB — CUP PACEART INCLINIC DEVICE CHECK
Battery Remaining Longevity: 88 mo
Battery Voltage: 3.01 V
Brady Statistic AP VP Percent: 0.05 %
Brady Statistic AP VS Percent: 99.61 %
Brady Statistic AS VP Percent: 0 %
Brady Statistic AS VS Percent: 0.34 %
Brady Statistic RA Percent Paced: 99.66 %
Brady Statistic RV Percent Paced: 0.05 %
Date Time Interrogation Session: 20171010172516
Implantable Lead Implant Date: 20091102
Implantable Lead Implant Date: 20091102
Implantable Lead Location: 753859
Implantable Lead Location: 753860
Implantable Lead Model: 5076
Implantable Lead Model: 5076
Lead Channel Impedance Value: 399 Ohm
Lead Channel Impedance Value: 418 Ohm
Lead Channel Impedance Value: 494 Ohm
Lead Channel Impedance Value: 513 Ohm
Lead Channel Pacing Threshold Amplitude: 0.625 V
Lead Channel Pacing Threshold Amplitude: 2.5 V
Lead Channel Pacing Threshold Pulse Width: 0.4 ms
Lead Channel Pacing Threshold Pulse Width: 0.4 ms
Lead Channel Sensing Intrinsic Amplitude: 1 mV
Lead Channel Sensing Intrinsic Amplitude: 21.75 mV
Lead Channel Setting Pacing Amplitude: 2.5 V
Lead Channel Setting Pacing Amplitude: 2.5 V
Lead Channel Setting Pacing Pulse Width: 0.4 ms
Lead Channel Setting Sensing Sensitivity: 2.8 mV

## 2016-03-02 DIAGNOSIS — L602 Onychogryphosis: Secondary | ICD-10-CM | POA: Diagnosis not present

## 2016-03-02 DIAGNOSIS — M898X7 Other specified disorders of bone, ankle and foot: Secondary | ICD-10-CM | POA: Diagnosis not present

## 2016-03-02 DIAGNOSIS — L84 Corns and callosities: Secondary | ICD-10-CM | POA: Diagnosis not present

## 2016-03-02 DIAGNOSIS — E1151 Type 2 diabetes mellitus with diabetic peripheral angiopathy without gangrene: Secondary | ICD-10-CM | POA: Diagnosis not present

## 2016-03-07 ENCOUNTER — Ambulatory Visit: Payer: Medicare Other | Admitting: Endocrinology

## 2016-03-11 ENCOUNTER — Ambulatory Visit (INDEPENDENT_AMBULATORY_CARE_PROVIDER_SITE_OTHER): Payer: Medicare Other | Admitting: Family

## 2016-03-11 ENCOUNTER — Encounter: Payer: Self-pay | Admitting: Family

## 2016-03-11 DIAGNOSIS — I2510 Atherosclerotic heart disease of native coronary artery without angina pectoris: Secondary | ICD-10-CM

## 2016-03-11 DIAGNOSIS — H60392 Other infective otitis externa, left ear: Secondary | ICD-10-CM

## 2016-03-11 DIAGNOSIS — H60399 Other infective otitis externa, unspecified ear: Secondary | ICD-10-CM | POA: Insufficient documentation

## 2016-03-11 MED ORDER — HYDROCORTISONE-ACETIC ACID 1-2 % OT SOLN: 5.0000 [drp] | Freq: Four times a day (QID) | OTIC | 0 refills | Status: DC

## 2016-03-11 NOTE — Patient Instructions (Signed)
Thank you for choosing [facility].  SUMMARY AND INSTRUCTIONS:  Use Debrox or Murine as needed for ear cleaning.   Medication:  Please start the ear drops.   Your prescription(s) have been submitted to your pharmacy or been printed and provided for you. Please take as directed and contact our office if you believe you are having problem(s) with the medication(s) or have any questions.  Follow up:  If your symptoms worsen or fail to improve, please contact our office for further instruction, or in case of emergency go directly to the emergency room at the closest medical facility.     Otitis Externa Otitis externa is a bacterial or fungal infection of the outer ear canal. This is the area from the eardrum to the outside of the ear. Otitis externa is sometimes called "swimmer's ear." CAUSES  Possible causes of infection include:  Swimming in dirty water.  Moisture remaining in the ear after swimming or bathing.  Mild injury (trauma) to the ear.  Objects stuck in the ear (foreign body).  Cuts or scrapes (abrasions) on the outside of the ear. SIGNS AND SYMPTOMS  The first symptom of infection is often itching in the ear canal. Later signs and symptoms may include swelling and redness of the ear canal, ear pain, and yellowish-white fluid (pus) coming from the ear. The ear pain may be worse when pulling on the earlobe. DIAGNOSIS  Your health care provider will perform a physical exam. A sample of fluid may be taken from the ear and examined for bacteria or fungi. TREATMENT  Antibiotic ear drops are often given for 10 to 14 days. Treatment may also include pain medicine or corticosteroids to reduce itching and swelling. HOME CARE INSTRUCTIONS   Apply antibiotic ear drops to the ear canal as prescribed by your health care provider.  Take medicines only as directed by your health care provider.  If you have diabetes, follow any additional treatment instructions from your  health care provider.  Keep all follow-up visits as directed by your health care provider. PREVENTION   Keep your ear dry. Use the corner of a towel to absorb water out of the ear canal after swimming or bathing.  Avoid scratching or putting objects inside your ear. This can damage the ear canal or remove the protective wax that lines the canal. This makes it easier for bacteria and fungi to grow.  Avoid swimming in lakes, polluted water, or poorly chlorinated pools.  You may use ear drops made of rubbing alcohol and vinegar after swimming. Combine equal parts of white vinegar and alcohol in a bottle. Put 3 or 4 drops into each ear after swimming. SEEK MEDICAL CARE IF:   You have a fever.  Your ear is still red, swollen, painful, or draining pus after 3 days.  Your redness, swelling, or pain gets worse.  You have a severe headache.  You have redness, swelling, pain, or tenderness in the area behind your ear. MAKE SURE YOU:   Understand these instructions.  Will watch your condition.  Will get help right away if you are not doing well or get worse.   This information is not intended to replace advice given to you by your health care provider. Make sure you discuss any questions you have with your health care provider.   Document Released: 05/02/2005 Document Revised: 05/23/2014 Document Reviewed: 05/19/2011 Elsevier Interactive Patient Education [employer].

## 2016-03-11 NOTE — Progress Notes (Signed)
Subjective:    Patient ID: David [name], male    DOB: 1940/12/06, 75 y.o.   MRN: [id number]  Chief Complaint  Patient presents with  . Ear Fullness    HPI:  David [name] is a 75 y.o. 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 and presents today for an acute office visit.   This is a new problem. Associated symptom of pain and fullness located in his left ear has been going on for a couple of weeks. Denies any fevers or discharge. There is mild decreased hearing. Has recently started with a new hearing aid in his left ear about 1 week ago. There are no modifying factors that make it better or worse.   No Known Allergies  Current Outpatient Prescriptions  Medication Sig Dispense Refill  . ACCU-CHEK SOFTCLIX LANCETS lancets USE AS INSTRUCTED TO CHECK BLOOD SUGAR 6 TIMES PER DAY Dx: Code E11.9 300 each 2  . aspirin EC 325 MG tablet Take 1 tablet (325 mg total) by mouth daily. 42 tablet 0  . Blood Glucose Monitoring Suppl (ACCU-CHEK AVIVA) device USE AS INSTRUCTED TO CHECK BLOOD SUGAR 6 TIMES PER DAY Dx: Code E11.9 1 each 0  . calcitRIOL (ROCALTROL) 0.25 MCG capsule Take 0.25 mcg by mouth daily.     . carvedilol (COREG) 25 MG tablet Take 1 tablet (25 mg total) by mouth 2 (two) times daily with a meal. 90 tablet 1  . carvedilol (COREG) 25 MG tablet TAKE ONE TABLET BY MOUTH TWICE DAILY WITH A MEAL 90 tablet 1    . doxazosin (CARDURA) 4 MG tablet TAKE ONE TABLET BY MOUTH ONCE DAILY 90 tablet 1  . glucose blood (ACCU-CHEK AVIVA) test strip USE AS INSTRUCTED TO CHECK BLOOD SUGAR 6 TIMES PER DAY Dx: Code E11.9 300 each 2  . hydrocortisone cream 1 % Apply to affected area 2 times daily 15 g 0  . insulin aspart (NOVOLOG) 100 UNIT/ML injection USE MAX 120 UNITS PER DAY WITH CONTINUOUS INSULIN PUMP. Dx code: E11.49 40 mL 5  . isosorbide mononitrate (IMDUR) 30 MG 24 hr tablet TAKE TWO TABLETS BY MOUTH ONCE DAILY 60 tablet 11  . Liraglutide (VICTOZA) 18 MG/3ML SOPN INJECT 0.3MLS (1.8 MG TOTAL) INTO THE SKIN DAILY 6 pen 3  . lisinopril (PRINIVIL,ZESTRIL) 5 MG tablet Take 5 mg by mouth daily.    . Omega-3 Fatty Acids (FISH OIL) 1000 MG CAPS Take by mouth 2 (two) times daily.    . pantoprazole (PROTONIX) 40 MG tablet Take 1 tablet (40 mg total) by mouth daily. 90 tablet 1  . sertraline (ZOLOFT) 50 MG tablet Take 1 tablet (50 mg total) by mouth daily. 30 tablet 11  . torsemide (DEMADEX) 20 MG tablet TAKE TWO TABLETS BY MOUTH  DAILY 180 tablet 1  . traMADol (ULTRAM) 50 MG tablet Take 1 tablet (50 mg total) by mouth every 6 (six) hours as needed. 20 tablet 0  . acetic acid-hydrocortisone (VOSOL-HC) otic solution Place 5 drops into the left ear  4 (four) times daily. 10 mL 0   No current facility-administered medications for this visit.     Review of Systems  Constitutional: Negative for chills and fever.  HENT: Positive for ear pain. Negative for congestion and ear discharge.   Neurological: Negative for headaches.      Objective:    BP (!) 128/50 (BP Location: Left Arm, Patient Position: Sitting, Cuff Size: Large)   Pulse 86   Temp 98.3 F (36.8 C) (Oral)   Resp 18   Ht 6\' 4"  (1.93 m)   Wt 282 lb (127.9 kg)   SpO2 95%   BMI 34.33 kg/m  Nursing note and vital signs reviewed.  Physical Exam  HENT:  Right Ear: Hearing, tympanic membrane, external ear and ear canal normal. No drainage, swelling or  tenderness. No decreased hearing is noted.  Left Ear: There is swelling and tenderness. Decreased hearing is noted.  There is impacted cerumen noted in the right ear.        Assessment & Plan:   Problem List Items Addressed This Visit      Nervous and Auditory   Otitis, externa, infective    Symptoms and exam consistent with otitis externa and increased cerumen. Start Acetic acid-hydrocortisone. At home ear cleaning kits as needed. Follow up if symptoms worsen or do not improve.        Other Visit Diagnoses   None.      I am having Mr. Dupin start on acetic acid-hydrocortisone. I am also having him maintain his Fish Oil, calcitRIOL, aspirin EC, carvedilol, sertraline, isosorbide mononitrate, hydrocortisone cream, lisinopril, traMADol, pantoprazole, doxazosin, torsemide, ACCU-CHEK AVIVA, ACCU-CHEK SOFTCLIX LANCETS, insulin aspart, glucose blood, liraglutide, and carvedilol.   Meds ordered this encounter  Medications  . acetic acid-hydrocortisone (VOSOL-HC) otic solution    Sig: Place 5 drops into the left ear 4 (four) times daily.    Dispense:  10 mL    Refill:  0    Order Specific Question:   Supervising Provider    Answer:   Pricilla Holm A [[id number]]     Follow-up: Return if symptoms worsen or fail to improve.  [name], FNP

## 2016-03-11 NOTE — Assessment & Plan Note (Signed)
Symptoms and exam consistent with otitis externa and increased cerumen. Start Acetic acid-hydrocortisone. At home ear cleaning kits as needed. Follow up if symptoms worsen or do not improve.

## 2016-03-14 DIAGNOSIS — Z23 Encounter for immunization: Secondary | ICD-10-CM | POA: Diagnosis not present

## 2016-03-15 ENCOUNTER — Other Ambulatory Visit: Payer: Self-pay | Admitting: Family

## 2016-03-15 ENCOUNTER — Other Ambulatory Visit: Payer: Self-pay

## 2016-03-15 MED ORDER — LIRAGLUTIDE 18 MG/3ML ~~LOC~~ SOPN: PEN_INJECTOR | SUBCUTANEOUS | 1 refills | Status: DC

## 2016-03-16 DIAGNOSIS — E1151 Type 2 diabetes mellitus with diabetic peripheral angiopathy without gangrene: Secondary | ICD-10-CM | POA: Diagnosis not present

## 2016-03-16 DIAGNOSIS — L84 Corns and callosities: Secondary | ICD-10-CM | POA: Diagnosis not present

## 2016-03-22 ENCOUNTER — Ambulatory Visit: Payer: Medicare Other | Admitting: Endocrinology

## 2016-03-23 ENCOUNTER — Ambulatory Visit (INDEPENDENT_AMBULATORY_CARE_PROVIDER_SITE_OTHER): Payer: Medicare Other | Admitting: Endocrinology

## 2016-03-23 ENCOUNTER — Encounter: Payer: Self-pay | Admitting: Endocrinology

## 2016-03-23 ENCOUNTER — Other Ambulatory Visit: Payer: Self-pay

## 2016-03-23 VITALS — BP 140/62 | HR 89 | Ht 76.0 in | Wt 291.0 lb

## 2016-03-23 DIAGNOSIS — I2510 Atherosclerotic heart disease of native coronary artery without angina pectoris: Secondary | ICD-10-CM

## 2016-03-23 DIAGNOSIS — Z794 Long term (current) use of insulin: Secondary | ICD-10-CM | POA: Diagnosis not present

## 2016-03-23 DIAGNOSIS — E1165 Type 2 diabetes mellitus with hyperglycemia: Secondary | ICD-10-CM | POA: Diagnosis not present

## 2016-03-23 LAB — POCT GLYCOSYLATED HEMOGLOBIN (HGB A1C): Hemoglobin A1C: 7.5

## 2016-03-23 MED ORDER — GLUCOSE BLOOD VI STRP: ORAL_STRIP | 3 refills | Status: DC

## 2016-03-23 NOTE — Progress Notes (Signed)
Patient ID: David [name], male   DOB: 06-19-1940, 75 y.o.   MRN: [id number]    Reason for Appointment: Followup for Type 2 Diabetes  History of Present Illness:          Diagnosis: Type 2 diabetes mellitus, date of diagnosis:   1992       Past history:  He was initially treated with metformin and at some point also glipizide. His previous records from out of town are not available and no information is available about his level of control Apparently he was started on insulin in 1994 approximately because of poor control He has been on various insulin regimens over the last several years However even with insulin he has had poor control for at least the last 7 or 8 years. He does not know what his previous A1c levels have been. He had been continued on metformin and glipizide but metformin stopped about 2 years ago because of kidney function abnormality He had been taking Lantus 60 units twice a day with NovoLog previously and also Before his initial consultation in 6/15 he was on NovoLog twice a day and Humalog mix insulin  Because of poor control and large insulin doses he was started on Victoza in 6/15  Recent history:   INSULIN PUMP  regimen is as follows   Basal rates 3.0 at midnight, 3.8 at 6 AM, 3.9 at 1 PM and 3.0 at 10 PM  Carbohydrate coverage 1:7 for lunch otherwise 1:5, correction 1:10 with target 120-150  Average total daily insulin 89 +/-4 units, basal insulin is 89 %  Since 04/28/14 he has been on a [employer] insulin pump because of persistent poor control and high insulin requirement  Initially with starting the pump his blood sugars had been significantly better but subsequently blood sugars have been again high with A1c consistently  over 8% A1c now down to 7.5  Current management, problems and blood sugar patterns:  He has only done 10 blood sugar readings in his pump in the last 2 weeks  He had a change in his test strips done by the pharmacy and  he was given an Accu-Chek and not clear why.  He did not bring his Accu-Chek meter today and not clear if he is checking his blood sugar and not entering it in the pump  Overall blood sugars are better as judged by his A1c  However he has not been bolusing much in the last 2 weeks and has several days without any boluses at all, last night had only one bolus for his bedtime snack  He is  taking Victoza  regularly.  Appears to have gained weight, he does not think he is always consistent with diet.  Today he is concerned that he is not getting his supplies for his pump and his previous supplier has gone out of business       Oral hypoglycemic drugs the patient is taking are: None, has renal dysfunction        Glucose monitoring:  done about 3 times a day         Glucometer:  contour     Blood Glucose readings from pump download:  FASTING 195-221 Nonfasting 134-237 with no consistent pattern Has only a couple of readings over 200 before supper  Hypoglycemia: None   Glycemic control:   Lab Results  Component Value Date   HGBA1C 8.4 (H) 11/13/2015   HGBA1C 8.3 (H) 08/11/2015   HGBA1C 8.8 (H)  05/15/2015   Lab Results  Component Value Date   MICROALBUR 9.2 (H) 05/15/2015   LDLCALC 49 10/29/2013   CREATININE 2.10 (H) 11/13/2015    Self-care: The diet that the patient has been following YY:TKPT  Meals: 2- 3 meals per day.breakfast is either cereal or eggs and toast, lunch is usually a sandwich , dinner at  6-7 pm.   He thinks his portions are small but periodically eats sweets.  Eating chicken More regularly Bedtime snack is usually crackers and fruit          Exercise:  Walking At times, a few days a week, not doing exercise bike    Dietician visit: Most recent: several years ago.               Compliance with the medical regimen: good Retinal exam: Most recent: 05/2013.    Weight history:  Wt Readings from Last 3 Encounters:  03/23/16 291 lb (132 kg)  03/11/16 282 lb  (127.9 kg)  02/23/16 281 lb 9.6 oz (127.7 kg)        Medication List       Accurate as of 03/23/16  3:11 PM. Always use your most recent med list.          ACCU-CHEK AVIVA device USE AS INSTRUCTED TO CHECK BLOOD SUGAR 6 TIMES PER DAY Dx: Code E11.9   ACCU-CHEK SOFTCLIX LANCETS lancets USE AS INSTRUCTED TO CHECK BLOOD SUGAR 6 TIMES PER DAY Dx: Code E11.9   acetic acid-hydrocortisone otic solution Commonly known as:  VOSOL-HC Place 5 drops into the left ear 4 (four) times daily.   aspirin EC 325 MG tablet Take 1 tablet (325 mg total) by mouth daily.   calcitRIOL 0.25 MCG capsule Commonly known as:  ROCALTROL Take 0.25 mcg by mouth daily.   carvedilol 25 MG tablet Commonly known as:  COREG Take 1 tablet (25 mg total) by mouth 2 (two) times daily with a meal.   carvedilol 25 MG tablet Commonly known as:  COREG TAKE ONE TABLET BY MOUTH TWICE DAILY WITH A MEAL   doxazosin 4 MG tablet Commonly known as:  CARDURA TAKE ONE TABLET BY MOUTH ONCE DAILY   Fish Oil 1000 MG Caps Take by mouth 2 (two) times daily.   glucose blood test strip Commonly known as:  ACCU-CHEK AVIVA USE AS INSTRUCTED TO CHECK BLOOD SUGAR 6 TIMES PER DAY Dx: Code E11.9   hydrocortisone cream 1 % Apply to affected area 2 times daily   insulin aspart 100 UNIT/ML injection Commonly known as:  NOVOLOG USE MAX 120 UNITS PER DAY WITH CONTINUOUS INSULIN PUMP. Dx code: E11.49   isosorbide mononitrate 30 MG 24 hr tablet Commonly known as:  IMDUR TAKE TWO TABLETS BY MOUTH ONCE DAILY   liraglutide 18 MG/3ML Sopn Commonly known as:  VICTOZA INJECT 0.3MLS (1.8 MG TOTAL) INTO THE SKIN DAILY   lisinopril 5 MG tablet Commonly known as:  PRINIVIL,ZESTRIL Take 5 mg by mouth daily.   pantoprazole 40 MG tablet Commonly known as:  PROTONIX TAKE ONE TABLET BY MOUTH ONCE DAILY   sertraline 50 MG tablet Commonly known as:  ZOLOFT Take 1 tablet (50 mg total) by mouth daily.   torsemide 20 MG  tablet Commonly known as:  DEMADEX TAKE TWO TABLETS BY MOUTH  DAILY   traMADol 50 MG tablet Commonly known as:  ULTRAM Take 1 tablet (50 mg total) by mouth every 6 (six) hours as needed.       Allergies: No  Known Allergies  Past Medical History:  Diagnosis Date  . Anemia, iron deficiency   . Anxiety   . Arthritis   . BPH (benign prostatic hypertrophy)   . CAD (coronary artery disease)    Nonobstructive CAD per cath  . Cardiac pacemaker in situ   . CHF (congestive heart failure) ([place])   . Chronic ulcer of right foot ([place])   . 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 Dyspnea   . History of cellulitis    right great toe 10-25-2014  . History of skin cancer   . Hypertension   . Insulin dependent type 2 diabetes mellitus ([place]) 1991   followd by [name]--  has insulin pump  . Insulin pump in place   . OSA on CPAP   . Peripheral neuropathy ([facility])    severe  . Peripheral vascular disease ([place])    bilateral lower extremities  . Secondary hyperparathyroidism of renal origin ([place])   . Sinus node dysfunction [facility])     Past Surgical History:  Procedure Laterality Date  . 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 AMPUTATION TOE Right 03/12/2015   Procedure: RIGHT HALLUS AMPUTATION ;  Surgeon: [name], MD;  Location: [facility];  Service: Podiatry;  Laterality: Right;  . CARDIAC CATHETERIZATION  11-25-2010   [place], [place]   Nonobstructive CAD  . CARDIAC PACEMAKER PLACEMENT  Nov 2009   [employer]tronic  . EP IMPLANTABLE DEVICE N/A 06/03/2015   Procedure: PPM Generator Changeout;  Surgeon: [name], MD;  Location: [facility];  Service: Cardiovascular;  Laterality: N/A;  . EXCISION BONE CYST Right 03/06/2015   Procedure: BONE BIOPSIES OF RIGHT FOOT;  Surgeon: [name], MD;  Location: [facility];  Service: Podiatry;  Laterality: Right;  . ORIF  ANKLE FRACTURE Left 11/06/2014   Procedure: OPEN REDUCTION INTERNAL FIXATION (ORIF) LEFT  ANKLE FRACTURE;  Surgeon: [name], MD;  Location: [facility];  Service: Orthopedics;  Laterality: Left;  . TOTAL KNEE ARTHROPLASTY    . VEIN LIGATION AND STRIPPING      Family History  Problem Relation Age of Onset  . Cancer Mother     breast  . Heart attack Father     Social History:  reports that he quit smoking about 32 years ago. He has a 60.00 pack-year smoking history. He has never used smokeless tobacco. He reports that he drinks about 0.6 oz of alcohol per week . He reports that he does not use drugs.    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 Also had taken Gembrozil for several years without apparent side effects.  Both of these were  stopped because of increased liver functions and LFT has been back to normal LDL has not been high subsequently but HDL is low  Currently taking  fish oil and LDL is within target but triglycerides are high       Lab Results  Component Value Date   CHOL 158 11/13/2015   HDL 22.00 (L) 11/13/2015   LDLCALC 49 10/29/2013   LDLDIRECT 68.0 11/13/2015   TRIG 302.0 (H) 11/13/2015   CHOLHDL 7 11/13/2015  HYPERTENSION: The blood pressure has been treated with various drugs.   Currently taking carvedilol, lisinopril 5 mg and doxazosin   Chronic kidney disease: Etiology unknown, is followed by nephrologist  Last creatinine:  Lab Results  Component Value Date   CREATININE 2.10 (H) 11/13/2015        Has history of Numbness in his feet  since about 2013, no tingling or burning in feet    He has been wearing diabetic shoes.  He has been followed by podiatrist, has a couple of small blisters recently  Previous foot exam findings From 02/2015:   Vibration sense is absent  in toes. Ankle jerks are absent bilaterally.          Diabetic foot exam:  No callus formation. Mild denudation of skin on left second toe superiorly Amputation of right third toe. Thickening and dryness of skin on the toes on the right Absent monofilament sensation in the feet Absent pedal pulses     Physical Examination:  BP 140/62   Pulse 89   Ht 6\' 4"  (1.93 m)   Wt 291 lb (132 kg)   BMI 35.42 kg/m   1+ lower leg edema present  ASSESSMENT:  Diabetes type 2, uncontrolled with obesity See history of present illness for detailed discussion of current diabetes management, blood sugar patterns and problems identified   His blood sugars are overall better with A1c 7.5 although he has very few blood sugars in his pump to review today He apparently is using the Accu-Chek monitor which reportedly is less expensive at the pharmacy Also having some difficulty getting established with a new supply for his pump infusion sets He is however continuing to gain  weight  PLAN:   Pump settings will be unchanged for now    He will be starting back on the contour blood sugar monitoring  Encouraged him to check blood sugars 3-4 times a day at least for the couple of weeks before his visit  He needs to bolus with every single meal and snack that has carbohydrates regardless of whether he checks his blood sugars are not  Supplier form faxed for him to get his infusion sets today  He will call if he is unable to continue his pump this week and will discuss using Lantus instead  Follow-up with nurse educator in 2 weeks, he also needs review of his diet also   A1c in 3 months   Hypertension: Blood pressure is normal, followed by PCP  LIPIDS: Needs reassessment   Counseling time on subjects discussed above is over 50% of today's 25 minute visit   There are no Patient Instructions on file for this visit.  David [name] 03/23/2016, 3:11 PM   Note: This office note was prepared with Dragon  voice recognition system technology. Any transcriptional errors that result from this process are unintentional.

## 2016-03-23 NOTE — Patient Instructions (Addendum)
Bolus with every meal  Check sugar 4x daily

## 2016-03-25 ENCOUNTER — Telehealth: Payer: Self-pay | Admitting: Endocrinology

## 2016-03-25 ENCOUNTER — Encounter: Payer: Self-pay | Admitting: Internal Medicine

## 2016-03-25 NOTE — Telephone Encounter (Signed)
You can fax the notes, form was signed

## 2016-03-25 NOTE — Progress Notes (Signed)
(  late entry)- 03/24/16 the patient's demographics/ records were faxed to [facility] [phone] to evaluate for genetic testing for HCM.  Confirmation received.

## 2016-03-25 NOTE — Telephone Encounter (Signed)
[employer] called requesting the office notes from the requested dates that they faxed over yesterday so they can release the Pt's pump supplies. Can call back only if there are any questions; [phone]  Fax # [phone]

## 2016-03-28 NOTE — Telephone Encounter (Signed)
Spoke with patients wife to let her know that papers for David [name] have been faxed- she stated an understanding

## 2016-03-28 NOTE — Telephone Encounter (Signed)
Pt is asking for the notes to be sent to [facility] for the insulin pump supplies please  Fax number is [phone] please call pt once done

## 2016-03-28 NOTE — Telephone Encounter (Signed)
Pt called in requesting that those office notes be sent in, he has been out of his pump supplies for a week now.

## 2016-04-11 DIAGNOSIS — B353 Tinea pedis: Secondary | ICD-10-CM | POA: Diagnosis not present

## 2016-04-11 DIAGNOSIS — E1151 Type 2 diabetes mellitus with diabetic peripheral angiopathy without gangrene: Secondary | ICD-10-CM | POA: Diagnosis not present

## 2016-04-11 DIAGNOSIS — I739 Peripheral vascular disease, unspecified: Secondary | ICD-10-CM | POA: Diagnosis not present

## 2016-04-20 ENCOUNTER — Encounter: Payer: Self-pay | Admitting: Internal Medicine

## 2016-04-25 DIAGNOSIS — B353 Tinea pedis: Secondary | ICD-10-CM | POA: Diagnosis not present

## 2016-04-25 DIAGNOSIS — E1151 Type 2 diabetes mellitus with diabetic peripheral angiopathy without gangrene: Secondary | ICD-10-CM | POA: Diagnosis not present

## 2016-04-25 DIAGNOSIS — I739 Peripheral vascular disease, unspecified: Secondary | ICD-10-CM | POA: Diagnosis not present

## 2016-04-25 DIAGNOSIS — L84 Corns and callosities: Secondary | ICD-10-CM | POA: Diagnosis not present

## 2016-05-17 NOTE — Progress Notes (Signed)
Subjective:   David [name] is a 76 y.o. male who presents for an Initial Medicare Annual Wellness Visit.  Review of Systems  No ROS.  Medicare Wellness Visit.  Cardiac Risk Factors include: advanced age (>62men, >83 women);obesity (BMI >30kg/m2);sedentary lifestyle;hypertension;diabetes mellitus Sleep patterns: Sleeps 8-10 hrs per night. Wakes once each night to urinate. Normally feels rested. Home Safety/Smoke Alarms: Smoke and Carbon monoxide detectors in home.  Living environment; residence and Firearm Safety: Lives in 1 story home with wife. No guns. Feels unsure on safety due to high crime in [place].  Seat Belt Safety/Bike Helmet: Wears seat belt.   Counseling:   Eye Exam- Wears glasses all the time. Follows with doctor annually but can't recall name.  Dental- upper and lower dentures.No dentist. Gum care discussed.  Male:   CCS- Last in 2013 per pt.Normal per pt.    PSA- No results found for: PSA     Objective:    Today's Vitals   05/18/16 0855  BP: (!) 150/62  Pulse: 71  SpO2: 99%  Weight: 289 lb (131.1 kg)  Height: 6\' 4"  (1.93 m)   Body mass index is 35.18 kg/m.  Current Medications (verified) Outpatient Encounter Prescriptions as of 05/18/2016  Medication Sig  . ACCU-CHEK SOFTCLIX LANCETS lancets USE AS INSTRUCTED TO CHECK BLOOD SUGAR 6 TIMES PER DAY Dx: Code E11.9  . Blood Glucose Monitoring Suppl (ACCU-CHEK AVIVA) device USE AS INSTRUCTED TO CHECK BLOOD SUGAR 6 TIMES PER DAY Dx: Code E11.9  . calcitRIOL (ROCALTROL) 0.25 MCG capsule Take 0.25 mcg by mouth daily.   . carvedilol (COREG) 25 MG tablet Take 1 tablet (25 mg total) by mouth 2 (two) times daily with a meal.  . doxazosin (CARDURA) 4 MG tablet TAKE ONE TABLET BY MOUTH ONCE DAILY  . glucose blood (BAYER CONTOUR NEXT TEST) test strip Use to test blood sugar 3 times daily    Dx code is E11.49  . hydrocortisone cream 1 % Apply to affected area 2 times daily  . insulin aspart (NOVOLOG) 100 UNIT/ML  injection USE MAX 120 UNITS PER DAY WITH CONTINUOUS INSULIN PUMP. Dx code: E11.49  . isosorbide mononitrate (IMDUR) 30 MG 24 hr tablet TAKE TWO TABLETS BY MOUTH ONCE DAILY  . liraglutide (VICTOZA) 18 MG/3ML SOPN INJECT 0.3MLS (1.8 MG TOTAL) INTO THE SKIN DAILY  . lisinopril (PRINIVIL,ZESTRIL) 5 MG tablet Take 5 mg by mouth daily.  . pantoprazole (PROTONIX) 40 MG tablet TAKE ONE TABLET BY MOUTH ONCE DAILY  . sertraline (ZOLOFT) 50 MG tablet Take 1 tablet (50 mg total) by mouth daily.  Marland Kitchen torsemide (DEMADEX) 20 MG tablet TAKE TWO TABLETS BY MOUTH  DAILY  . aspirin EC 325 MG tablet Take 1 tablet (325 mg total) by mouth daily. (Patient taking differently: Take 81 mg by mouth daily. )  . carvedilol (COREG) 25 MG tablet TAKE ONE TABLET BY MOUTH TWICE DAILY WITH A MEAL  . Omega-3 Fatty Acids (FISH OIL) 1000 MG CAPS Take by mouth 2 (two) times daily.  . [DISCONTINUED] acetic acid-hydrocortisone (VOSOL-HC) otic solution Place 5 drops into the left ear 4 (four) times daily. (Patient not taking: Reported on 03/23/2016)  . [DISCONTINUED] traMADol (ULTRAM) 50 MG tablet Take 1 tablet (50 mg total) by mouth every 6 (six) hours as needed. (Patient not taking: Reported on 03/23/2016)   No facility-administered encounter medications on file as of 05/18/2016.     Allergies (verified) Patient has no known allergies.   History: Past Medical History:  Diagnosis  Date  . Anemia, iron deficiency   . Anxiety   . Arthritis   . BPH (benign prostatic hypertrophy)   . CAD (coronary artery disease)    Nonobstructive CAD per cath  . Cardiac pacemaker in situ   . CHF (congestive heart failure) ([place])   . Chronic ulcer of right foot ([place])   . 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 Dyspnea   . History of cellulitis    right great toe 10-25-2014  . History of skin cancer   . Hypertension   . 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 Insulin pump in place   . OSA on CPAP   . Peripheral neuropathy (HCC)    severe  . Peripheral vascular disease ([place])    bilateral lower extremities  . Secondary hyperparathyroidism of renal origin ([place])   . Sinus node dysfunction [facility])    Past Surgical History:  Procedure Laterality Date  . AMPUTATION OF REPLICATED TOES  Mar 4696   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)  . AMPUTATION TOE Right 03/12/2015   Procedure: RIGHT HALLUS AMPUTATION ;  Surgeon: [name], MD;  Location: [place] [facility];  Service: Podiatry;  Laterality: Right;  . CARDIAC CATHETERIZATION  11-25-2010   [place], [place]   Nonobstructive CAD  . CARDIAC PACEMAKER PLACEMENT  Nov 2009   Medtronic  . EP IMPLANTABLE DEVICE N/A 06/03/2015   Procedure: PPM Generator Changeout;  Surgeon: [name], MD;  Location: [facility];  Service: Cardiovascular;  Laterality: N/A;  . EXCISION BONE CYST Right 03/06/2015   Procedure: BONE BIOPSIES OF RIGHT FOOT;  Surgeon: [name], MD;  Location: [facility];  Service: Podiatry;  Laterality: Right;  . ORIF ANKLE FRACTURE Left 11/06/2014   Procedure: OPEN REDUCTION INTERNAL FIXATION (ORIF) LEFT  ANKLE FRACTURE;  Surgeon: [name], MD;  Location: [facility];  Service: Orthopedics;  Laterality: Left;  . TOTAL KNEE ARTHROPLASTY    . VEIN LIGATION AND STRIPPING     Family History  Problem Relation Age of Onset  . Cancer Mother     breast  . Heart attack Father    Social History   Occupational History  . Retired    Social History Main Topics  . Smoking status: Former Smoker    Packs/day: 2.00    Years: 30.00    Quit date: 03/03/1984  . Smokeless tobacco: Former Systems developer  . Alcohol use 0.6 oz/week    1 Glasses of wine per week     Comment: social  . Drug use: No  . Sexual activity: Not Currently   Tobacco Counseling Counseling given: No   Activities of Daily Living In your present state of  health, do you have any difficulty performing the following activities: 05/18/2016 06/03/2015  Hearing? (No Data) N  Vision? N N  Difficulty concentrating or making decisions? N Y  Walking or climbing stairs? Y N  Dressing or bathing? N Y  Doing errands, shopping? N -  Preparing Food and eating ? N -  Using the Toilet? N -  In the past six months, have you accidently leaked urine? N -  Do you have problems with loss of bowel control? N -  Managing your Medications? N -  Managing your Finances? N -  Housekeeping or managing your Housekeeping? N -  Some recent  data might be hidden    Immunizations and Health Maintenance Immunization History  Administered Date(s) Administered  . Influenza,inj,Quad PF,36+ Mos 01/30/2014  . Influenza-Unspecified 03/17/2015   Health Maintenance Due  Topic Date Due  . TETANUS/TDAP  08/21/1959  . COLONOSCOPY  08/21/1990  . ZOSTAVAX  08/20/2000  . PNA vac Low Risk Adult (1 of 2 - PCV13) 08/20/2005  . INFLUENZA VACCINE  12/15/2015  . FOOT EXAM  02/17/2016    Patient Care Team: [name], FNP as PCP - General (Family Medicine) [name], MD as Consulting Physician (Podiatry) [name], MD as Consulting Physician (Nephrology)  Indicate any recent Medical Services you may have received from other than [facility] providers in the past year (date may be approximate).    Assessment:   This is a routine wellness examination for David [name]. Physical assessment deferred to PCP.   Hearing/Vision screen No exam data present  Dietary issues and exercise activities discussed: Current Exercise Habits: The patient does not participate in regular exercise at present   Diet (meal preparation, eat out, water intake, caffeinated beverages, dairy products, fruits and vegetables): well balanced, on average, 3 meals per day Breakfast: 2 pieces of toast with sugar free jelly with bottle of water Lunch: soup, water Dinner: [employer] chicken dinner. Drinks about 3  large bottles of water per day. 1 cup of coffee in the am.   Goals    . Walk 3x/week 61min each time.    . Weight (lb) < 275 lb (124.7 kg)      Depression Screen PHQ 2/9 Scores 05/18/2016 06/08/2015 05/04/2015 07/18/2014  PHQ - 2 Score 0 0 0 1    Fall Risk Fall Risk  05/18/2016 07/20/2015 06/08/2015 05/04/2015 07/18/2014  Falls in the past year? No Yes No No No  Number falls in past yr: - 2 or more - - -  Injury with Fall? - Yes - - -  Risk for fall due to : - Other (Comment) - - -  Risk for fall due to (comments): - just got dizzy and blackout was stitting in the sun for a period of time - - -    Cognitive Function: MMSE - Mini Mental State Exam 05/18/2016  Orientation to time 5  Orientation to Place 5  Registration 3  Attention/ Calculation 5  Recall 3  Language- name 2 objects 2  Language- repeat 1  Language- follow 3 step command 3  Language- read & follow direction 1  Write a sentence 1  Copy design 1  Total score 30        Screening Tests Health Maintenance  Topic Date Due  . TETANUS/TDAP  08/21/1959  . COLONOSCOPY  08/21/1990  . ZOSTAVAX  08/20/2000  . PNA vac Low Risk Adult (1 of 2 - PCV13) 08/20/2005  . INFLUENZA VACCINE  12/15/2015  . FOOT EXAM  02/17/2016  . HEMOGLOBIN A1C  09/20/2016  . OPHTHALMOLOGY EXAM  12/01/2016        Plan:     Bring a copy of your advance directives to your next office visit.  Continue to eat heart healthy diet (full of fruits, vegetables, whole grains, lean protein, water--limit salt, fat, and sugar intake) and increase physical activity as tolerated.  Continue doing brain stimulating activities (puzzles, reading, adult coloring books, staying active) to keep memory sharp.   Faxed Release of Info sheet to [facility] in [place], [place]. Pt states they should have all of his health maintenance  info.   During the course of the visit David [name] was educated and counseled about the following appropriate  screening and preventive services:   Vaccines to include Pneumoccal, Influenza, Hepatitis B, Td, Zostavax, HCV  Colorectal cancer screening  Cardiovascular disease screening  Diabetes screening  Glaucoma screening  Nutrition counseling  Prostate cancer screening   Patient Instructions (the written plan) were given to the patient.   Shela Nevin, [place]   05/18/2016    Medical screening examination/treatment/procedure(s) were performed by non-physician practitioner and as supervising provider I was immediately available for consultation/collaboration. I agree with treatment plan. [name], FNP

## 2016-05-17 NOTE — Progress Notes (Signed)
Pre visit review using our clinic review tool, if applicable. No additional management support is needed unless otherwise documented below in the visit note. 

## 2016-05-18 ENCOUNTER — Ambulatory Visit (INDEPENDENT_AMBULATORY_CARE_PROVIDER_SITE_OTHER): Payer: Medicare Other | Admitting: *Deleted

## 2016-05-18 VITALS — BP 150/62 | HR 71 | Ht 76.0 in | Wt 289.0 lb

## 2016-05-18 DIAGNOSIS — Z0000 Encounter for general adult medical examination without abnormal findings: Secondary | ICD-10-CM

## 2016-05-18 NOTE — Progress Notes (Signed)
Medical screening examination/treatment/procedure(s) were performed by non-physician practitioner and as supervising provider I was immediately available for consultation/collaboration. I agree with treatment plan. [name], FNP

## 2016-05-18 NOTE — Patient Instructions (Signed)
Bring a copy of your advance directives to your next office visit.  Continue to eat heart healthy diet (full of fruits, vegetables, whole grains, lean protein, water--limit salt, fat, and sugar intake) and increase physical activity as tolerated.  Continue doing brain stimulating activities (puzzles, reading, adult coloring books, staying active) to keep memory sharp.

## 2016-05-23 ENCOUNTER — Other Ambulatory Visit: Payer: Medicare Other

## 2016-05-23 ENCOUNTER — Other Ambulatory Visit: Payer: Self-pay

## 2016-05-23 DIAGNOSIS — D509 Iron deficiency anemia, unspecified: Secondary | ICD-10-CM | POA: Diagnosis not present

## 2016-05-23 DIAGNOSIS — N183 Chronic kidney disease, stage 3 (moderate): Secondary | ICD-10-CM | POA: Diagnosis not present

## 2016-05-23 DIAGNOSIS — R3911 Hesitancy of micturition: Secondary | ICD-10-CM | POA: Diagnosis not present

## 2016-05-23 DIAGNOSIS — N2581 Secondary hyperparathyroidism of renal origin: Secondary | ICD-10-CM | POA: Diagnosis not present

## 2016-05-23 DIAGNOSIS — R809 Proteinuria, unspecified: Secondary | ICD-10-CM | POA: Diagnosis not present

## 2016-05-23 DIAGNOSIS — Z6835 Body mass index (BMI) 35.0-35.9, adult: Secondary | ICD-10-CM | POA: Diagnosis not present

## 2016-05-23 DIAGNOSIS — I129 Hypertensive chronic kidney disease with stage 1 through stage 4 chronic kidney disease, or unspecified chronic kidney disease: Secondary | ICD-10-CM | POA: Diagnosis not present

## 2016-05-23 DIAGNOSIS — E1129 Type 2 diabetes mellitus with other diabetic kidney complication: Secondary | ICD-10-CM | POA: Diagnosis not present

## 2016-05-23 DIAGNOSIS — E875 Hyperkalemia: Secondary | ICD-10-CM | POA: Diagnosis not present

## 2016-05-23 DIAGNOSIS — M869 Osteomyelitis, unspecified: Secondary | ICD-10-CM | POA: Diagnosis not present

## 2016-05-23 DIAGNOSIS — N401 Enlarged prostate with lower urinary tract symptoms: Secondary | ICD-10-CM | POA: Diagnosis not present

## 2016-05-23 MED ORDER — LIRAGLUTIDE 18 MG/3ML ~~LOC~~ SOPN: PEN_INJECTOR | SUBCUTANEOUS | 1 refills | Status: DC

## 2016-05-24 ENCOUNTER — Ambulatory Visit (INDEPENDENT_AMBULATORY_CARE_PROVIDER_SITE_OTHER): Payer: Medicare Other | Admitting: *Deleted

## 2016-05-24 ENCOUNTER — Telehealth: Payer: Self-pay | Admitting: Cardiology

## 2016-05-24 DIAGNOSIS — I495 Sick sinus syndrome: Secondary | ICD-10-CM | POA: Diagnosis not present

## 2016-05-24 NOTE — Telephone Encounter (Signed)
Confirmed remote transmission w/ pt wife.   

## 2016-05-25 ENCOUNTER — Encounter: Payer: Self-pay | Admitting: Cardiology

## 2016-05-25 ENCOUNTER — Telehealth: Payer: Self-pay | Admitting: Cardiology

## 2016-05-25 ENCOUNTER — Ambulatory Visit: Payer: Medicare Other | Admitting: Endocrinology

## 2016-05-25 DIAGNOSIS — E1151 Type 2 diabetes mellitus with diabetic peripheral angiopathy without gangrene: Secondary | ICD-10-CM | POA: Diagnosis not present

## 2016-05-25 DIAGNOSIS — L84 Corns and callosities: Secondary | ICD-10-CM | POA: Diagnosis not present

## 2016-05-25 DIAGNOSIS — M2042 Other hammer toe(s) (acquired), left foot: Secondary | ICD-10-CM | POA: Diagnosis not present

## 2016-05-25 DIAGNOSIS — M898X7 Other specified disorders of bone, ankle and foot: Secondary | ICD-10-CM | POA: Diagnosis not present

## 2016-05-25 DIAGNOSIS — M2041 Other hammer toe(s) (acquired), right foot: Secondary | ICD-10-CM | POA: Diagnosis not present

## 2016-05-25 DIAGNOSIS — I70293 Other atherosclerosis of native arteries of extremities, bilateral legs: Secondary | ICD-10-CM | POA: Diagnosis not present

## 2016-05-25 NOTE — Progress Notes (Signed)
Remote pacemaker transmission.   

## 2016-05-25 NOTE — Telephone Encounter (Signed)
Opened in error

## 2016-05-26 LAB — CUP PACEART REMOTE DEVICE CHECK
Battery Remaining Longevity: 88 mo
Battery Voltage: 3 V
Brady Statistic AP VP Percent: 0.05 %
Brady Statistic AP VS Percent: 99.23 %
Brady Statistic AS VP Percent: 0 %
Brady Statistic AS VS Percent: 0.72 %
Brady Statistic RA Percent Paced: 99.27 %
Brady Statistic RV Percent Paced: 0.05 %
Date Time Interrogation Session: 20180109210234
Implantable Lead Implant Date: 20091102
Implantable Lead Implant Date: 20091102
Implantable Lead Location: 753859
Implantable Lead Location: 753860
Implantable Lead Model: 5076
Implantable Lead Model: 5076
Implantable Pulse Generator Implant Date: 20170118
Lead Channel Impedance Value: 380 Ohm
Lead Channel Impedance Value: 418 Ohm
Lead Channel Impedance Value: 494 Ohm
Lead Channel Impedance Value: 532 Ohm
Lead Channel Pacing Threshold Amplitude: 0.75 V
Lead Channel Pacing Threshold Amplitude: 2.5 V
Lead Channel Pacing Threshold Pulse Width: 0.4 ms
Lead Channel Pacing Threshold Pulse Width: 0.4 ms
Lead Channel Sensing Intrinsic Amplitude: 2 mV
Lead Channel Sensing Intrinsic Amplitude: 2 mV
Lead Channel Sensing Intrinsic Amplitude: 22.625 mV
Lead Channel Sensing Intrinsic Amplitude: 22.625 mV
Lead Channel Setting Pacing Amplitude: 2.5 V
Lead Channel Setting Pacing Amplitude: 2.5 V
Lead Channel Setting Pacing Pulse Width: 0.4 ms
Lead Channel Setting Sensing Sensitivity: 2.8 mV

## 2016-05-30 ENCOUNTER — Other Ambulatory Visit (INDEPENDENT_AMBULATORY_CARE_PROVIDER_SITE_OTHER): Payer: Medicare Other

## 2016-05-30 DIAGNOSIS — Z794 Long term (current) use of insulin: Secondary | ICD-10-CM

## 2016-05-30 DIAGNOSIS — E1165 Type 2 diabetes mellitus with hyperglycemia: Secondary | ICD-10-CM | POA: Diagnosis not present

## 2016-05-30 LAB — BASIC METABOLIC PANEL
BUN: 29 mg/dL — ABNORMAL HIGH (ref 6–23)
CO2: 25 mEq/L (ref 19–32)
Calcium: 9.7 mg/dL (ref 8.4–10.5)
Chloride: 104 mEq/L (ref 96–112)
Creatinine, Ser: 1.62 mg/dL — ABNORMAL HIGH (ref 0.40–1.50)
GFR: 44.28 mL/min — ABNORMAL LOW (ref >60.00)
Glucose, Bld: 282 mg/dL — ABNORMAL HIGH (ref 70–99)
Potassium: 4.6 mEq/L (ref 3.5–5.1)
Sodium: 135 mEq/L (ref 135–145)

## 2016-05-30 LAB — HEMOGLOBIN A1C: Hgb A1c MFr Bld: 8.1 % — ABNORMAL HIGH (ref 4.6–6.5)

## 2016-06-01 ENCOUNTER — Other Ambulatory Visit: Payer: Self-pay | Admitting: Endocrinology

## 2016-06-02 ENCOUNTER — Ambulatory Visit: Payer: Medicare Other | Admitting: Endocrinology

## 2016-06-03 DIAGNOSIS — H43813 Vitreous degeneration, bilateral: Secondary | ICD-10-CM | POA: Diagnosis not present

## 2016-06-03 DIAGNOSIS — E113391 Type 2 diabetes mellitus with moderate nonproliferative diabetic retinopathy without macular edema, right eye: Secondary | ICD-10-CM | POA: Diagnosis not present

## 2016-06-03 DIAGNOSIS — E113312 Type 2 diabetes mellitus with moderate nonproliferative diabetic retinopathy with macular edema, left eye: Secondary | ICD-10-CM | POA: Diagnosis not present

## 2016-06-03 DIAGNOSIS — H35373 Puckering of macula, bilateral: Secondary | ICD-10-CM | POA: Diagnosis not present

## 2016-06-03 LAB — HM DIABETES EYE EXAM

## 2016-06-07 DIAGNOSIS — L84 Corns and callosities: Secondary | ICD-10-CM | POA: Diagnosis not present

## 2016-06-07 DIAGNOSIS — M898X7 Other specified disorders of bone, ankle and foot: Secondary | ICD-10-CM | POA: Diagnosis not present

## 2016-06-07 DIAGNOSIS — I70293 Other atherosclerosis of native arteries of extremities, bilateral legs: Secondary | ICD-10-CM | POA: Diagnosis not present

## 2016-06-07 DIAGNOSIS — E1151 Type 2 diabetes mellitus with diabetic peripheral angiopathy without gangrene: Secondary | ICD-10-CM | POA: Diagnosis not present

## 2016-06-08 ENCOUNTER — Encounter: Payer: Self-pay | Admitting: Cardiology

## 2016-06-14 ENCOUNTER — Encounter: Payer: Self-pay | Admitting: Endocrinology

## 2016-06-14 ENCOUNTER — Ambulatory Visit (INDEPENDENT_AMBULATORY_CARE_PROVIDER_SITE_OTHER): Payer: Medicare Other | Admitting: Endocrinology

## 2016-06-14 VITALS — BP 150/66 | HR 83 | Ht 76.0 in | Wt 283.0 lb

## 2016-06-14 DIAGNOSIS — E782 Mixed hyperlipidemia: Secondary | ICD-10-CM | POA: Diagnosis not present

## 2016-06-14 DIAGNOSIS — E1165 Type 2 diabetes mellitus with hyperglycemia: Secondary | ICD-10-CM | POA: Diagnosis not present

## 2016-06-14 DIAGNOSIS — I10 Essential (primary) hypertension: Secondary | ICD-10-CM | POA: Diagnosis not present

## 2016-06-14 DIAGNOSIS — Z794 Long term (current) use of insulin: Secondary | ICD-10-CM | POA: Diagnosis not present

## 2016-06-14 MED ORDER — DOXAZOSIN MESYLATE 4 MG PO TABS: 4.0000 mg | ORAL_TABLET | Freq: Two times a day (BID) | ORAL | 1 refills | Status: DC

## 2016-06-14 NOTE — Progress Notes (Signed)
Patient ID: David [name], male   DOB: [birth year], 76 y.o.   MRN: [id number]    Reason for Appointment: Followup for Type 2 Diabetes  History of Present Illness:          Diagnosis: Type 2 diabetes mellitus, date of diagnosis:   1992       Past history:  He was initially treated with metformin and at some point also glipizide. His previous records from out of town are not available and no information is available about his level of control Apparently he was started on insulin in 1994 approximately because of poor control He has been on various insulin regimens over the last several years However even with insulin he has had poor control for at least the last 7 or 8 years. He does not know what his previous A1c levels have been. He had been continued on metformin and glipizide but metformin stopped about 2 years ago because of kidney function abnormality He had been taking Lantus 60 units twice a day with NovoLog previously and also Before his initial consultation in 6/15 he was on NovoLog twice a day and Humalog mix insulin  Because of poor control and large insulin doses he was started on Victoza in 6/15  Recent history:   INSULIN PUMP  regimen is as follows   Basal rates 3.0 at midnight, 3.8 at 6 AM, 3.9 at 1 PM and 3.0 at 10 PM  Carbohydrate coverage 1:5 until 12 noon, 1:7 for lunch, 1:5 at 6 PM, correction 1:10 with target 120-150  Average total daily insulin 99 +/- 13 units, basal insulin is 85 %  Since 04/28/14 he has been on a Medtronic insulin pump because of persistent poor control and high insulin requirement  Initially with the pump his blood sugars had been significantly better but subsequently blood sugars have been again high with A1c mostly over 8% A1c now higher again at 8.1  Current management, problems and blood sugar patterns:  He has checked his blood sugars more often after his last visit  He had rather with his infusion set cannula about 2  weeks ago and blood sugars were very high for a couple of days at least and improved after changing the set.  However his blood sugars were still mostly high until about 5 days ago and they are improving  FASTING blood sugars more recently have been near normal, lowest 82  He now says that he is not bolusing when the blood sugars are below 120 and he does not understand the need to cover his meals regardless of blood sugar.  Also he will not bolus consistently every day and has a couple of days without any boluses at all  He does not bolus for HIGH blood sugars for correction  Also not checking POSTPRANDIAL readings after his meals especially in the evening when he is eating most of his food.  Most of his readings later at night are high including once after his evening meal on Friday  Blood sugars are also not consistently high in the afternoon but occasionally very high around suppertime  He is  taking Victoza  regularly but he was told by the pharmacy that it will cost $500 and he had to pay this recently.  Appears to have lost a little weight gain since last visit       Oral hypoglycemic drugs the patient is taking are: None, has renal dysfunction  Glucose monitoring:  done about 3 times a day         Glucometer:  contour     Blood Glucose readings from pump download:  Mean values apply above for all meters except median for One Touch  PRE-MEAL Fasting Lunch Dinner Bedtime Overall  Glucose range: 82-253  109-277  97-261  154-344    Mean/median:     204+/-104    Hypoglycemia: None   Glycemic control:   Lab Results  Component Value Date   HGBA1C 8.1 (H) 05/30/2016   HGBA1C 7.5 03/23/2016   HGBA1C 8.4 (H) 11/13/2015   Lab Results  Component Value Date   MICROALBUR 9.2 (H) 05/15/2015   LDLCALC 49 10/29/2013   CREATININE 1.62 (H) 05/30/2016    Self-care:   Meals: 2- 3 meals per day. May skip breakfast otherwise breakfast is either cereal or eggs and toast, lunch  is usually a sandwich , dinner at  6-7 pm.   He thinks his portions are small but periodically eats sweets.  Bedtime snack is usually crackers and fruit          Exercise:  Walking Very little recently, does have exercise bike Dietician visit: Most recent: several years ago.               Compliance with the medical regimen: good Retinal exam: Most recent: 05/2013.    Weight history:  Wt Readings from Last 3 Encounters:  06/14/16 283 lb (128.4 kg)  05/18/16 289 lb (131.1 kg)  03/23/16 291 lb (132 kg)      Allergies as of 06/14/2016   No Known Allergies     Medication List       Accurate as of 06/14/16 12:23 PM. Always use your most recent med list.          ACCU-CHEK AVIVA device USE AS INSTRUCTED TO CHECK BLOOD SUGAR 6 TIMES PER DAY Dx: Code E11.9   ACCU-CHEK SOFTCLIX LANCETS lancets USE AS INSTRUCTED TO CHECK BLOOD SUGAR 6 TIMES PER DAY Dx: Code E11.9   aspirin EC 325 MG tablet Take 1 tablet (325 mg total) by mouth daily.   calcitRIOL 0.25 MCG capsule Commonly known as:  ROCALTROL Take 0.25 mcg by mouth daily.   carvedilol 25 MG tablet Commonly known as:  COREG Take 1 tablet (25 mg total) by mouth 2 (two) times daily with a meal.   doxazosin 4 MG tablet Commonly known as:  CARDURA TAKE ONE TABLET BY MOUTH ONCE DAILY   Fish Oil 1000 MG Caps Take by mouth 2 (two) times daily.   glucose blood test strip Commonly known as:  BAYER CONTOUR NEXT TEST Use to test blood sugar 3 times daily    Dx code is E11.49   hydrocortisone cream 1 % Apply to affected area 2 times daily   insulin aspart 100 UNIT/ML injection Commonly known as:  NOVOLOG USE MAX 120 UNITS PER DAY WITH CONTINUOUS INSULIN PUMP. Dx code: E11.49   isosorbide mononitrate 30 MG 24 hr tablet Commonly known as:  IMDUR TAKE TWO TABLETS BY MOUTH ONCE DAILY   liraglutide 18 MG/3ML Sopn Commonly known as:  VICTOZA INJECT 0.3MLS (1.8 MG TOTAL) INTO THE SKIN DAILY   lisinopril 5 MG tablet Commonly  known as:  PRINIVIL,ZESTRIL Take 5 mg by mouth daily.   pantoprazole 40 MG tablet Commonly known as:  PROTONIX TAKE ONE TABLET BY MOUTH ONCE DAILY   sertraline 50 MG tablet Commonly known as:  ZOLOFT Take 1 tablet (50  mg total) by mouth daily.   torsemide 20 MG tablet Commonly known as:  DEMADEX TAKE TWO TABLETS BY MOUTH  DAILY       Allergies: No Known Allergies  Past Medical History:  Diagnosis Date  . Anemia, iron deficiency   . Anxiety   . Arthritis   . BPH (benign prostatic hypertrophy)   . CAD (coronary artery disease)    Nonobstructive CAD per cath  . Cardiac pacemaker in situ   . CHF (congestive heart failure) ([place])   . Chronic ulcer of right foot ([place])   . 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 Dyspnea   . History of cellulitis    right great toe 10-25-2014  . History of skin cancer   . Hypertension   . 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 Insulin pump in place   . OSA on CPAP   . Peripheral neuropathy (HCC)    severe  . Peripheral vascular disease ([place])    bilateral lower extremities  . Secondary hyperparathyroidism of renal origin ([place])   . Sinus node dysfunction [facility])     Past Surgical History:  Procedure Laterality Date  . AMPUTATION OF REPLICATED TOES  Mar 1194   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)  . AMPUTATION TOE Right 03/12/2015   Procedure: RIGHT HALLUS AMPUTATION ;  Surgeon: [name], MD;  Location: [facility];  Service: Podiatry;  Laterality: Right;  . CARDIAC CATHETERIZATION  11-25-2010   [place], [place]   Nonobstructive CAD  . CARDIAC PACEMAKER PLACEMENT  Nov 2009   Medtronic  . EP IMPLANTABLE DEVICE N/A 06/03/2015   Procedure: PPM Generator Changeout;  Surgeon: [name], MD;  Location: [place] [facility];  Service: Cardiovascular;  Laterality: N/A;  . EXCISION BONE CYST Right 03/06/2015    Procedure: BONE BIOPSIES OF RIGHT FOOT;  Surgeon: [name], MD;  Location: [facility];  Service: Podiatry;  Laterality: Right;  . ORIF ANKLE FRACTURE Left 11/06/2014   Procedure: OPEN REDUCTION INTERNAL FIXATION (ORIF) LEFT  ANKLE FRACTURE;  Surgeon: [name], MD;  Location: [facility];  Service: Orthopedics;  Laterality: Left;  . TOTAL KNEE ARTHROPLASTY    . VEIN LIGATION AND STRIPPING      Family History  Problem Relation Age of Onset  . Cancer Mother     breast  . Heart attack Father     Social History:  reports that he quit smoking about 32 years ago. He has a 60.00 pack-year smoking history. He has quit using smokeless tobacco. He reports that he drinks about 0.6 oz of alcohol per week . He reports that he does not use drugs.    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 Also had taken Gembrozil for several years without apparent side effects.  Both of these were  stopped because of increased liver functions and LFT has been back to normal LDL has not been high subsequently but HDL is low  He is taking  fish oil and LDL is within target but triglycerides are usually high       Lab Results  Component Value Date  CHOL 158 11/13/2015   HDL 22.00 (L) 11/13/2015   LDLCALC 49 10/29/2013   LDLDIRECT 68.0 11/13/2015   TRIG 302.0 (H) 11/13/2015   CHOLHDL 7 11/13/2015      HYPERTENSION: The blood pressure has been treated with various drugs.   Currently taking carvedilol, lisinopril 5 mg and doxazosin 4 mg Not clear why her blood pressure is higher than usual, he thinks he is compliant with his medications  Chronic kidney disease: Etiology unknown, is followed by nephrologist  Last creatinine:  Lab Results  Component Value Date   CREATININE 1.62 (H) 05/30/2016        Has history  of Numbness in his feet  since about 2013, no tingling or burning in feet    He has been wearing diabetic shoes.  He has been followed by podiatrist, has a couple of small blisters recently  Previous foot exam findings From 02/2015:   Vibration sense is absent in toes. Ankle jerks are absent bilaterally.          Diabetic foot exam:  No callus formation. Mild denudation of skin on left second toe superiorly Amputation of right third toe. Thickening and dryness of skin on the toes on the right Absent monofilament sensation in the feet Absent pedal pulses   He has seen the podiatrist in 1/80    Physical Examination:  BP (!) 150/66   Pulse 83   Ht 6\' 4"  (1.93 m)   Wt 283 lb (128.4 kg)   SpO2 94%   BMI 34.45 kg/m   Trace lower leg edema present  Repeat blood pressure 155/70  ASSESSMENT:  Diabetes type 2, uncontrolled with obesity See history of present illness for detailed discussion of current diabetes management, blood sugar patterns and problems identified   His blood sugars are overall still poorly controlled As discussed above his main problem is not bolusing when he needs to Discussed need to bolus regardless of blood sugar before meals Also needs to stop skipping boluses for snacks, meals and high sugars Reminded him to change his infusion set right away if blood sugars are markedly increased for no reason  PLAN:   Pump settings will be changed as follows for now  Midnight = 2.9, 6 AM = 3.6, 11 AM-10 PM = 3.9  Carbohydrate ratio at 6 PM = 1:4    He will need to bolus more consistently as directed  Follow-up in 2 months to reassess his level of control and compliance  He does need more diabetes education but he does not keep his appointments with the educator   He will need to check with insurance whether Victoza is being covered as he appears to be paying full price now   Hypertension: Blood pressure is higher than usual and will increase his Cardura to twice  a day He will also discussed blood pressure treatment with nephrologist and PCP  LIPIDS: Needs reassessment of fasting labs   Counseling time on subjects discussed above is over 50% of today's 25 minute visit   Patient Instructions  RULES for bolusing  Must bolus for all meals and snacks that have any carbohydrate regardless of blood sugar before eating, even if blood sugars below 120 you will need  to bolus  He also bolus for all blood sugars over 180 if not eating a meal  Carbohydrate ratio setting at 6 PM is 4 instead of 5  If blood sugars persistently very high check the infusion set  DOXAZOSIN 4 mg: Increase this  to twice a day for blood pressure  Check with [facility] insurance about the coverage for Victoza and call us back   [facility] 06/14/2016, 12:23 PM   Note: This office note was prepared with Dragon voice recognition system technology. Any transcriptional errors that result from this process are unintentional.

## 2016-06-14 NOTE — Patient Instructions (Addendum)
RULES for bolusing  Must bolus for all meals and snacks that have any carbohydrate regardless of blood sugar before eating, even if blood sugars below 120 you will need  to bolus  He also bolus for all blood sugars over 180 if not eating a meal  Carbohydrate ratio setting at 6 PM is 4 instead of 5  If blood sugars persistently very high check the infusion set  DOXAZOSIN 4 mg: Increase this to twice a day for blood pressure  Check with [facility] insurance about the coverage for Victoza and call us back

## 2016-07-11 ENCOUNTER — Telehealth: Payer: Self-pay | Admitting: Endocrinology

## 2016-07-11 NOTE — Telephone Encounter (Signed)
This was faxed 07/11/16

## 2016-07-11 NOTE — Telephone Encounter (Signed)
[facility] need Clinical notes for patient last office vice  Phone# [phone]  Fax# [phone]

## 2016-07-12 ENCOUNTER — Telehealth: Payer: Self-pay | Admitting: Endocrinology

## 2016-07-12 NOTE — Telephone Encounter (Signed)
Pt called stating that the company where he gets his supplies/equipment from is needing a medical report sent to them, their phone number is [phone], please call pt with questions/concerns.

## 2016-07-13 NOTE — Telephone Encounter (Signed)
This has been done.

## 2016-07-15 NOTE — Telephone Encounter (Signed)
Spoke with [facility] they are going to fax over an order for this patient

## 2016-07-15 NOTE — Telephone Encounter (Signed)
[facility] calling on the status of Clinical office notes,  of patient last visit

## 2016-07-20 ENCOUNTER — Telehealth: Payer: Self-pay

## 2016-07-20 NOTE — Telephone Encounter (Signed)
Contacted [facility] and advised to submit another form for patient as they still had no received it. Attention it to me, I will look to see if fax has come through and fill it out and place on Dr.Kumar's desk.

## 2016-07-27 DIAGNOSIS — L84 Corns and callosities: Secondary | ICD-10-CM | POA: Diagnosis not present

## 2016-07-27 DIAGNOSIS — M2041 Other hammer toe(s) (acquired), right foot: Secondary | ICD-10-CM | POA: Diagnosis not present

## 2016-07-27 DIAGNOSIS — B351 Tinea unguium: Secondary | ICD-10-CM | POA: Diagnosis not present

## 2016-07-27 DIAGNOSIS — E1151 Type 2 diabetes mellitus with diabetic peripheral angiopathy without gangrene: Secondary | ICD-10-CM | POA: Diagnosis not present

## 2016-07-27 DIAGNOSIS — M2042 Other hammer toe(s) (acquired), left foot: Secondary | ICD-10-CM | POA: Diagnosis not present

## 2016-08-01 ENCOUNTER — Telehealth: Payer: Self-pay | Admitting: Internal Medicine

## 2016-08-01 NOTE — Telephone Encounter (Signed)
New message    Pt wife is calling asking about some testing that Dr. Olin Pia nurse was going to set up for pt. She states they haven't heard anything about it.

## 2016-08-05 ENCOUNTER — Other Ambulatory Visit (INDEPENDENT_AMBULATORY_CARE_PROVIDER_SITE_OTHER): Payer: Medicare Other

## 2016-08-05 DIAGNOSIS — Z794 Long term (current) use of insulin: Secondary | ICD-10-CM

## 2016-08-05 DIAGNOSIS — E1165 Type 2 diabetes mellitus with hyperglycemia: Secondary | ICD-10-CM | POA: Diagnosis not present

## 2016-08-05 LAB — BASIC METABOLIC PANEL
BUN: 47 mg/dL — ABNORMAL HIGH (ref 6–23)
CO2: 25 mEq/L (ref 19–32)
Calcium: 9.3 mg/dL (ref 8.4–10.5)
Chloride: 105 mEq/L (ref 96–112)
Creatinine, Ser: 2.45 mg/dL — ABNORMAL HIGH (ref 0.40–1.50)
GFR: 27.46 mL/min — ABNORMAL LOW (ref >60.00)
Glucose, Bld: 263 mg/dL — ABNORMAL HIGH (ref 70–99)
Potassium: 5.4 mEq/L — ABNORMAL HIGH (ref 3.5–5.1)
Sodium: 136 mEq/L (ref 135–145)

## 2016-08-05 LAB — LDL CHOLESTEROL, DIRECT: Direct LDL: 77 mg/dL

## 2016-08-05 LAB — LIPID PANEL
Cholesterol: 152 mg/dL (ref 0–200)
HDL: 19.9 mg/dL — ABNORMAL LOW (ref >39.00)
NonHDL: 132.27
Total CHOL/HDL Ratio: 8
Triglycerides: 243 mg/dL — ABNORMAL HIGH (ref 0.0–149.0)
VLDL: 48.6 mg/dL — ABNORMAL HIGH (ref 0.0–40.0)

## 2016-08-06 LAB — FRUCTOSAMINE: Fructosamine: 328 umol/L — ABNORMAL HIGH (ref 0–285)

## 2016-08-09 ENCOUNTER — Encounter: Payer: Self-pay | Admitting: Family

## 2016-08-09 ENCOUNTER — Ambulatory Visit: Payer: Medicare Other | Admitting: Family

## 2016-08-09 VITALS — BP 120/60 | HR 78 | Temp 98.3°F | Resp 16 | Ht 76.0 in | Wt 285.0 lb

## 2016-08-09 DIAGNOSIS — Z6834 Body mass index (BMI) 34.0-34.9, adult: Secondary | ICD-10-CM

## 2016-08-09 DIAGNOSIS — E6609 Other obesity due to excess calories: Secondary | ICD-10-CM

## 2016-08-09 DIAGNOSIS — E66811 Obesity, class 1: Secondary | ICD-10-CM

## 2016-08-09 DIAGNOSIS — L97519 Non-pressure chronic ulcer of other part of right foot with unspecified severity: Secondary | ICD-10-CM

## 2016-08-09 DIAGNOSIS — N289 Disorder of kidney and ureter, unspecified: Secondary | ICD-10-CM

## 2016-08-09 DIAGNOSIS — Z85828 Personal history of other malignant neoplasm of skin: Secondary | ICD-10-CM

## 2016-08-09 DIAGNOSIS — Z0000 Encounter for general adult medical examination without abnormal findings: Secondary | ICD-10-CM

## 2016-08-09 DIAGNOSIS — I10 Essential (primary) hypertension: Secondary | ICD-10-CM

## 2016-08-09 DIAGNOSIS — E7800 Pure hypercholesterolemia, unspecified: Secondary | ICD-10-CM

## 2016-08-09 NOTE — Assessment & Plan Note (Signed)
Most recent GFR consistent with chronic kidney disease stage III. Continue to manage risk factors through management of blood sugars, blood pressure, and cholesterol. Continue to monitor.

## 2016-08-09 NOTE — Progress Notes (Signed)
Subjective:    Patient ID: David [name], male    DOB: 05-Feb-1941, 76 y.o.   MRN: [id number]  Chief Complaint  Patient presents with  . CPE    not fasting,     HPI:  David [name] is a 76 y.o. male who presents today for a Medicare Annual Wellness/Physical exam.    1) Health Maintenance -   Diet - Averaging about 2 meals per day consisting of a regular diet; Caffeine intake of 1-2 cups daily.   Exercise - Limited exercise  2) Preventative Exams / Immunizations:  Dental -- Due for exam   Vision -- Up to date   Health Maintenance  Topic Date Due  . FOOT EXAM  02/17/2016  . COLONOSCOPY  11/15/2016 (Originally 08/21/1990)  . PNA vac Low Risk Adult (1 of 2 - PCV13) 11/15/2016 (Originally 08/20/2005)  . TETANUS/TDAP  05/18/2017 (Originally 08/21/1959)  . HEMOGLOBIN A1C  11/27/2016  . OPHTHALMOLOGY EXAM  06/03/2017  . INFLUENZA VACCINE  Completed     Immunization History  Administered Date(s) Administered  . Influenza,inj,Quad PF,36+ Mos 01/30/2014  . Influenza-Unspecified 03/17/2015, 04/15/2016    RISK FACTORS  Tobacco History  Smoking Status  . Former Smoker  . Packs/day: 2.00  . Years: 30.00  . Quit date: 03/03/1984  Smokeless Tobacco  . Former User     Cardiac risk factors: advanced age (older than 16 for men, 56 for women), diabetes mellitus, dyslipidemia, hypertension, male gender and obesity (BMI >= 30 kg/m2).  Depression Screen  Depression screen [facility] 2/9 05/18/2016  Decreased Interest 0  Down, Depressed, Hopeless 0  PHQ - 2 Score 0     Activities of Daily Living In your present state of health, do you have any difficulty performing the following activities?:  Driving? No Managing money?  No Feeding yourself? No Getting from bed to chair? No Climbing a flight of stairs? No Preparing food and eating?: No Bathing or showering? No Getting dressed: No Getting to the toilet? No Using the toilet: No Moving around from place to place: No In  the past year have you fallen or had a near fall?:No   Home Safety Has smoke detector and wears seat belts. No excess sun exposure. Are there smokers in your home (other than you)?  No Do you feel safe at home?  Yes  Hearing Difficulties: No Do you often ask people to speak up or repeat themselves? No Do you experience ringing or noises in your ears? No  Do you have difficulty understanding soft or whispered voices? No    Cognitive Testing  Alert? Yes   Normal Appearance? Yes  Oriented to person? Yes  Place? Yes   Time? Yes  Recall of three objects?  Yes  Can perform simple calculations? Yes  Displays appropriate judgment? Yes  Can read the correct time from a watch face? Yes  Do you feel that you have a problem with memory? No  Do you often misplace items? No   Advanced Directives have been discussed with the patient? Yes   Current Physicians/Providers and Suppliers  1. Terri Piedra, FNP - Internal Medicine 2. Elayne Snare, MD - Endocrinology 3. Virl Axe, MD - Cardiolgy  Indicate any recent Medical Services you may have received from other than [facility] providers in the past year (date may be approximate).  All answers were reviewed with the patient and necessary referrals were made:  [name], [place]   08/09/2016    No Known Allergies  Outpatient Medications Prior to Visit  Medication Sig Dispense Refill  . ACCU-CHEK SOFTCLIX LANCETS lancets USE AS INSTRUCTED TO CHECK BLOOD SUGAR 6 TIMES PER DAY Dx: Code E11.9 300 each 2  . aspirin EC 325 MG tablet Take 1 tablet (325 mg total) by mouth daily. (Patient taking differently: Take 81 mg by mouth daily. ) 42 tablet 0  . Blood Glucose Monitoring Suppl (ACCU-CHEK AVIVA) device USE AS INSTRUCTED TO CHECK BLOOD SUGAR 6 TIMES PER DAY Dx: Code E11.9 1 each 0  . calcitRIOL (ROCALTROL) 0.25 MCG capsule Take 0.25 mcg by mouth daily.     . carvedilol (COREG) 25 MG tablet Take 1 tablet (25 mg total) by mouth 2 (two) times daily with  a meal. 90 tablet 1  . doxazosin (CARDURA) 4 MG tablet Take 1 tablet (4 mg total) by mouth 2 (two) times daily. 180 tablet 1  . glucose blood (BAYER CONTOUR NEXT TEST) test strip Use to test blood sugar 3 times daily    Dx code is E11.49 100 each 3  . hydrocortisone cream 1 % Apply to affected area 2 times daily 15 g 0  . insulin aspart (NOVOLOG) 100 UNIT/ML injection USE MAX 120 UNITS PER DAY WITH CONTINUOUS INSULIN PUMP. Dx code: E11.49 40 mL 5  . isosorbide mononitrate (IMDUR) 30 MG 24 hr tablet TAKE TWO TABLETS BY MOUTH ONCE DAILY 60 tablet 11  . liraglutide (VICTOZA) 18 MG/3ML SOPN INJECT 0.3MLS (1.8 MG TOTAL) INTO THE SKIN DAILY 9 pen 1  . lisinopril (PRINIVIL,ZESTRIL) 5 MG tablet Take 5 mg by mouth daily.    . Omega-3 Fatty Acids (FISH OIL) 1000 MG CAPS Take by mouth 2 (two) times daily.    . pantoprazole (PROTONIX) 40 MG tablet TAKE ONE TABLET BY MOUTH ONCE DAILY 90 tablet 1  . sertraline (ZOLOFT) 50 MG tablet Take 1 tablet (50 mg total) by mouth daily. 30 tablet 11  . torsemide (DEMADEX) 20 MG tablet TAKE TWO TABLETS BY MOUTH  DAILY 180 tablet 1   No facility-administered medications prior to visit.      Past Medical History:  Diagnosis Date  . Anemia, iron deficiency   . Anxiety   . Arthritis   . BPH (benign prostatic hypertrophy)   . CAD (coronary artery disease)    Nonobstructive CAD per cath  . Cardiac pacemaker in situ   . CHF (congestive heart failure) ([place])   . Chronic ulcer of right foot ([place])   . 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 Dyspnea   . History of cellulitis    right great toe 10-25-2014  . History of skin cancer   . Hypertension   . 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 Insulin pump in place   . OSA on CPAP   . Peripheral neuropathy (HCC)    severe  . Peripheral vascular disease ([place])    bilateral lower extremities  . Secondary  hyperparathyroidism of renal origin ([place])   . Sinus node dysfunction [facility])      Past Surgical History:  Procedure Laterality Date  . AMPUTATION OF REPLICATED TOES  Mar 5621   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)  . AMPUTATION TOE Right 03/12/2015   Procedure: RIGHT HALLUS AMPUTATION ;  Surgeon: [name], MD;  Location: [place];  Service: Podiatry;  Laterality: Right;  . CARDIAC CATHETERIZATION  11-25-2010   [place], [place]   Nonobstructive CAD  . CARDIAC PACEMAKER PLACEMENT  Nov 2009   [employer]  . EP IMPLANTABLE DEVICE N/A 06/03/2015   Procedure: PPM Generator Changeout;  Surgeon: [name], MD;  Location: [facility];  Service: Cardiovascular;  Laterality: N/A;  . EXCISION BONE CYST Right 03/06/2015   Procedure: BONE BIOPSIES OF RIGHT FOOT;  Surgeon: [name], MD;  Location: [facility];  Service: Podiatry;  Laterality: Right;  . ORIF ANKLE FRACTURE Left 11/06/2014   Procedure: OPEN REDUCTION INTERNAL FIXATION (ORIF) LEFT  ANKLE FRACTURE;  Surgeon: [name], MD;  Location: [facility];  Service: Orthopedics;  Laterality: Left;  . TOTAL KNEE ARTHROPLASTY    . VEIN LIGATION AND STRIPPING       Family History  Problem Relation Age of Onset  . Cancer Mother     breast  . Heart attack Father      Social History   Social History  . Marital status: Married    Spouse name: N/A  . Number of children: 3  . Years of education: 12   Occupational History  . Retired    Social History Main Topics  . Smoking status: Former Smoker    Packs/day: 2.00    Years: 30.00    Quit date: 03/03/1984  . Smokeless tobacco: Former Systems developer  . Alcohol use 0.6 oz/week    1 Glasses of wine per week     Comment: social  . Drug use: No  . Sexual activity: Not Currently   Other Topics Concern  . Not on file   Social History Narrative   Currently resides with his wife. 1 dog. Fun/Hobby: Golf    Denies any religious beliefs effecting  health care.      Review of Systems  Constitutional: Denies fever, chills, fatigue, or significant weight gain/loss. HENT: Head: Denies headache or neck pain Ears: Denies changes in hearing, ringing in ears, earache, drainage Nose: Denies discharge, stuffiness, itching, nosebleed, sinus pain Throat: Denies sore throat, hoarseness, dry mouth, sores, thrush Eyes: Denies loss/changes in vision, pain, redness, blurry/double vision, flashing lights Cardiovascular: Denies chest pain/discomfort, tightness, palpitations, shortness of breath with activity, difficulty lying down, swelling, sudden awakening with shortness of breath Respiratory: Denies shortness of breath, cough, sputum production, wheezing Gastrointestinal: Denies dysphasia, heartburn, change in appetite, nausea, change in bowel habits, rectal bleeding, constipation, diarrhea, yellow skin or eyes Genitourinary: Denies frequency, urgency, burning/pain, blood in urine, incontinence, change in urinary strength. Musculoskeletal: Denies muscle/joint pain, stiffness, back pain, redness or swelling of joints, trauma Skin: Denies rashes, lumps, itching, dryness, color changes, or hair/nail changes Neurological: Denies dizziness, fainting, seizures, weakness, numbness, tingling, tremor Psychiatric - Denies nervousness, stress, depression or memory loss Endocrine: Denies heat or cold intolerance, sweating, frequent urination, excessive thirst, changes in appetite Hematologic: Denies ease of bruising or bleeding    Objective:     BP 120/60 (BP Location: Left Arm, Patient Position: Sitting, Cuff Size: Large)   Pulse 78   Temp 98.3 F (36.8 C) (Oral)   Resp 16   Ht 6\' 4"  (1.93 m)   Wt 285 lb (129.3 kg)   SpO2 96%   BMI 34.69 kg/m  Nursing note and vital signs reviewed.  Physical Exam  Constitutional: He is oriented to person, place, and time. He appears well-developed and well-nourished. No distress.  Cardiovascular: Normal rate,  regular rhythm, normal heart sounds and intact distal pulses.   Pulmonary/Chest: Effort normal and  breath sounds normal.  Neurological: He is alert and oriented to person, place, and time.  Skin: Skin is warm and dry.  Psychiatric: He has a normal mood and affect. His behavior is normal. Judgment and thought content normal.       Assessment & Plan:   During the course of the visit the patient was educated and counseled about appropriate screening and preventive services including:    Pneumococcal vaccine   Influenza vaccine  Td vaccine  Prostate cancer screening  Colorectal cancer screening  Diabetes screening  Glaucoma screening  Nutrition counseling   Diet review for nutrition referral? Yes ____  Not Indicated _X___   Patient Instructions (the written plan) was given to the patient.  Medicare Attestation I have personally reviewed: The patient's medical and social history Their use of alcohol, tobacco or illicit drugs Their current medications and supplements The patient's functional ability including ADLs,fall risks, home safety risks, cognitive, and hearing and visual impairment Diet and physical activities Evidence for depression or mood disorders  The patient's weight, height, BMI,  have been recorded in the chart.  I have made referrals, counseling, and provided education to the patient based on review of the above and I have provided the patient with a written personalized care plan for preventive services.     Problem List Items Addressed This Visit      Cardiovascular and Mediastinum   Essential hypertension    Blood pressure appears adequate control current medication regimen no adverse side effects. Denies worst headache of life. Continue current dosage of torsemide, lisinopril, and carvedilol. Continue monitor blood pressure at home and follow low-sodium diet.        Genitourinary   Renal insufficiency    Most recent GFR consistent with chronic  kidney disease stage III. Continue to manage risk factors through management of blood sugars, blood pressure, and cholesterol. Continue to monitor.        Other   Pure hypercholesterolemia    LDL of 77 with most recent blood work. Concern for HDL at 19. Continue current dosage of omega-3 fatty acids. Continue to follow-up with cardiology and question potential addition of PSK9 if appropriate.       Obesity    BMI of 34. Recommend weight loss of 5-10% of current body weight. Recommend increasing physical activity to 30 minutes of moderate level activity daily. Encourage nutritional intake that focuses on nutrient dense foods and is moderate, varied, and balanced and is low in saturated fats and processed/sugary foods. Continue to monitor.       Chronic ulcer of right foot ([place])    Ulcer located on the right foot appears nonhealing and managed by podiatry. No evidence of infection. Continue to monitor.      Relevant Orders   Ambulatory referral to Podiatry    Other Visit Diagnoses    Medicare annual wellness visit, subsequent    -  Primary   History of skin cancer       Relevant Orders   Ambulatory referral to Dermatology       I am having Mr. Binette maintain his Fish Oil, calcitRIOL, aspirin EC, carvedilol, sertraline, isosorbide mononitrate, hydrocortisone cream, lisinopril, torsemide, ACCU-CHEK AVIVA, ACCU-CHEK SOFTCLIX LANCETS, insulin aspart, pantoprazole, glucose blood, liraglutide, and doxazosin.   Follow-up: Return in about 3 months (around 11/09/2016), or if symptoms worsen or fail to improve.   [name], FNP

## 2016-08-09 NOTE — Patient Instructions (Addendum)
Thank you for choosing [facility].  SUMMARY AND INSTRUCTIONS:  They will call to schedule your appointments with podiatry and dermatology.  Keep taking your medications as prescribed.  Recommend increasing physical activity to 30 minutes 2-3 x per week.  Goal steps of 10,000 per day.  We will obtain PSA at next blood work.  Check on your colonoscopy.   Medication:  Your prescription(s) have been submitted to your pharmacy or been printed and provided for you. Please take as directed and contact our office if you believe you are having problem(s) with the medication(s) or have any questions.   Follow up:  If your symptoms worsen or fail to improve, please contact our office for further instruction, or in case of emergency go directly to the emergency room at the closest medical facility.    Health Maintenance  Topic Date Due  . FOOT EXAM  02/17/2016  . COLONOSCOPY  11/15/2016 (Originally 08/21/1990)  . PNA vac Low Risk Adult (1 of 2 - PCV13) 11/15/2016 (Originally 08/20/2005)  . TETANUS/TDAP  05/18/2017 (Originally 08/21/1959)  . HEMOGLOBIN A1C  11/27/2016  . OPHTHALMOLOGY EXAM  06/03/2017  . INFLUENZA VACCINE  Completed    Health Maintenance, Male A healthy lifestyle and preventive care is important for your health and wellness. Ask your health care provider about what schedule of regular examinations is right for you. What should I know about weight and diet?  Eat a Healthy Diet  Eat plenty of vegetables, fruits, whole grains, low-fat dairy products, and lean protein.  Do not eat a lot of foods high in solid fats, added sugars, or salt. Maintain a Healthy Weight  Regular exercise can help you achieve or maintain a healthy weight. You should:  Do at least 150 minutes of exercise each week. The exercise should increase your heart rate and make you sweat (moderate-intensity exercise).  Do strength-training exercises at least twice a week. Watch Your Levels of  Cholesterol and Blood Lipids  Have your blood tested for lipids and cholesterol every 5 years starting at 76 years of age. If you are at high risk for heart disease, you should start having your blood tested when you are 76 years old. You may need to have your cholesterol levels checked more often if:  Your lipid or cholesterol levels are high.  You are older than 76 years of age.  You are at high risk for heart disease. What should I know about cancer screening? Many types of cancers can be detected early and may often be prevented. Lung Cancer  You should be screened every year for lung cancer if:  You are a current smoker who has smoked for at least 30 years.  You are a former smoker who has quit within the past 15 years.  Talk to your health care provider about your screening options, when you should start screening, and how often you should be screened. Colorectal Cancer  Routine colorectal cancer screening usually begins at 76 years of age and should be repeated every 5-10 years until you are 76 years old. You may need to be screened more often if early forms of precancerous polyps or small growths are found. Your health care provider may recommend screening at an earlier age if you have risk factors for colon cancer.  Your health care provider may recommend using home test kits to check for hidden blood in the stool.  A small camera at the end of a tube can be used to examine your colon (  sigmoidoscopy or colonoscopy). This checks for the earliest forms of colorectal cancer. Prostate and Testicular Cancer  Depending on your age and overall health, your health care provider may do certain tests to screen for prostate and testicular cancer.  Talk to your health care provider about any symptoms or concerns you have about testicular or prostate cancer. Skin Cancer  Check your skin from head to toe regularly.  Tell your health care provider about any new moles or changes in  moles, especially if:  There is a change in a mole's size, shape, or color.  You have a mole that is larger than a pencil eraser.  Always use sunscreen. Apply sunscreen liberally and repeat throughout the day.  Protect yourself by wearing long sleeves, pants, a wide-brimmed hat, and sunglasses when outside. What should I know about heart disease, diabetes, and high blood pressure?  If you are 33-71 years of age, have your blood pressure checked every 3-5 years. If you are 22 years of age or older, have your blood pressure checked every year. You should have your blood pressure measured twice-once when you are at a hospital or clinic, and once when you are not at a hospital or clinic. Record the average of the two measurements. To check your blood pressure when you are not at a hospital or clinic, you can use:  An automated blood pressure machine at a pharmacy.  A home blood pressure monitor.  Talk to your health care provider about your target blood pressure.  If you are between 20-62 years old, ask your health care provider if you should take aspirin to prevent heart disease.  Have regular diabetes screenings by checking your fasting blood sugar level.  If you are at a normal weight and have a low risk for diabetes, have this test once every three years after the age of 51.  If you are overweight and have a high risk for diabetes, consider being tested at a younger age or more often.  A one-time screening for abdominal aortic aneurysm (AAA) by ultrasound is recommended for men aged 68-75 years who are current or former smokers. What should I know about preventing infection? Hepatitis B  If you have a higher risk for hepatitis B, you should be screened for this virus. Talk with your health care provider to find out if you are at risk for hepatitis B infection. Hepatitis C  Blood testing is recommended for:  Everyone born from [birth year] through 1965.  Anyone with known risk factors for  hepatitis C. Sexually Transmitted Diseases (STDs)  You should be screened each year for STDs including gonorrhea and chlamydia if:  You are sexually active and are younger than 76 years of age.  You are older than 76 years of age and your health care provider tells you that you are at risk for this type of infection.  Your sexual activity has changed since you were last screened and you are at an increased risk for chlamydia or gonorrhea. Ask your health care provider if you are at risk.  Talk with your health care provider about whether you are at high risk of being infected with HIV. Your health care provider may recommend a prescription medicine to help prevent HIV infection. What else can I do?  Schedule regular health, dental, and eye exams.  Stay current with your vaccines (immunizations).  Do not use any tobacco products, such as cigarettes, chewing tobacco, and e-cigarettes. If you need help quitting, ask your health care  provider.  Limit alcohol intake to no more than 2 drinks per day. One drink equals 12 ounces of beer, 5 ounces of wine, or 1 ounces of hard liquor.  Do not use street drugs.  Do not share needles.  Ask your health care provider for help if you need support or information about quitting drugs.  Tell your health care provider if you often feel depressed.  Tell your health care provider if you have ever been abused or do not feel safe at home. This information is not intended to replace advice given to you by your health care provider. Make sure you discuss any questions you have with your health care provider. Document Released: 10/29/2007 Document Revised: 12/30/2015 Document Reviewed: 02/03/2015 Elsevier Interactive Patient Education  2017 [employer].

## 2016-08-09 NOTE — Assessment & Plan Note (Signed)
Ulcer located on the right foot appears nonhealing and managed by podiatry. No evidence of infection. Continue to monitor.

## 2016-08-09 NOTE — Assessment & Plan Note (Signed)
LDL of 77 with most recent blood work. Concern for HDL at 19. Continue current dosage of omega-3 fatty acids. Continue to follow-up with cardiology and question potential addition of PSK9 if appropriate.

## 2016-08-09 NOTE — Assessment & Plan Note (Signed)
Blood pressure appears adequate control current medication regimen no adverse side effects. Denies worst headache of life. Continue current dosage of torsemide, lisinopril, and carvedilol. Continue monitor blood pressure at home and follow low-sodium diet.

## 2016-08-09 NOTE — Assessment & Plan Note (Signed)
BMI of 34. Recommend weight loss of 5-10% of current body weight. Recommend increasing physical activity to 30 minutes of moderate level activity daily. Encourage nutritional intake that focuses on nutrient dense foods and is moderate, varied, and balanced and is low in saturated fats and processed/sugary foods. Continue to monitor.

## 2016-08-10 ENCOUNTER — Ambulatory Visit (INDEPENDENT_AMBULATORY_CARE_PROVIDER_SITE_OTHER): Payer: Medicare Other | Admitting: Endocrinology

## 2016-08-10 ENCOUNTER — Encounter: Payer: Self-pay | Admitting: Endocrinology

## 2016-08-10 VITALS — BP 120/58 | HR 73 | Ht 76.0 in | Wt 285.0 lb

## 2016-08-10 DIAGNOSIS — E1165 Type 2 diabetes mellitus with hyperglycemia: Secondary | ICD-10-CM | POA: Diagnosis not present

## 2016-08-10 DIAGNOSIS — Z794 Long term (current) use of insulin: Secondary | ICD-10-CM | POA: Diagnosis not present

## 2016-08-10 DIAGNOSIS — IMO0002 Reserved for concepts with insufficient information to code with codable children: Secondary | ICD-10-CM

## 2016-08-10 DIAGNOSIS — E1149 Type 2 diabetes mellitus with other diabetic neurological complication: Secondary | ICD-10-CM

## 2016-08-10 NOTE — Progress Notes (Signed)
Patient ID: David [name], male   DOB: 1940/06/06, 76 y.o.   MRN: [id number]    Reason for Appointment: Followup for Type 2 Diabetes  History of Present Illness:          Diagnosis: Type 2 diabetes mellitus, date of diagnosis:   1992       Past history:  He was initially treated with metformin and at some point also glipizide. His previous records from out of town are not available and no information is available about his level of control Apparently he was started on insulin in 1994 approximately because of poor control He has been on various insulin regimens over the last several years However even with insulin he has had poor control for at least the last 7 or 8 years. He does not know what his previous A1c levels have been. He had been continued on metformin and glipizide but metformin stopped about 2 years ago because of kidney function abnormality He had been taking Lantus 60 units twice a day with NovoLog previously and also Before his initial consultation in 6/15 he was on NovoLog twice a day and Humalog mix insulin  Because of poor control and large insulin doses he was started on Victoza in 6/15  Recent history:   INSULIN PUMP  regimen is as follows  Basal rates  3.0 at midnight, 3.8 at 6 AM, 3.9 at 1 PM and 3.0 at 10 PM  Carbohydrate coverage 1:5 until 12 noon, 1:7 until 6 PM, 1:5 at 6 PM, correction 1:10 with target 120-150  Average total daily insulin 92 +/- 13 units   Since 04/28/14 he has been on a Medtronic insulin pump because of persistent poor control and high insulin requirement  Initially with the pump his blood sugars had been significantly better but subsequently blood sugars have been again high with A1c mostly over 8% A1c recently higher again at 8.1 Fructosamine is high at 328  Current management, problems and blood sugar patterns:  He has higher readings compared to the last visit  Review of his pump indicates that he is checking his  blood sugars mostly before meals but at variable times  FASTING blood sugars more recently have been much higher than normal and not clear why they are increasing; even when he has done his bolus for the evening meal the night before his fasting reading may be high  Despite repeated instructions on when he needs to bolus he is bolusing only infrequently and has only 5 boluses in the last 2 weeks  He does not do any CORRECTION boluses when the blood sugars are high even in the morning when they are over 200  Also not checking POSTPRANDIAL readings after his meals especially in the evening when he is eating most of his food.   Blood sugars are usually higher before supper also  He has had occasional readings that are low normal after doing a correction bolus midday  He is doing 1.2 mg Victoza and he thinks he is taking that every morning       Oral hypoglycemic drugs the patient is taking are: None, has renal dysfunction        Glucose monitoring:  done about 3 times a day         Glucometer:  contour     Blood Glucose readings from pump download:  Mean values apply above for all meters except median for One Touch  PRE-MEAL Fasting Afternoon  Dinner Bedtime  Overall  Glucose range: 198-295  86-346  150-281     Mean/median:   246   233    Hypoglycemia: None   Self-care:   Meals: 2- 3 meals per day. May skip breakfast otherwise breakfast is either cereal or eggs and toast, lunch is usually a sandwich at 1 pm , dinner at  6-7 pm.    He thinks his portions are small but periodically eats sweets.   Bedtime snack is usually crackers Or fruit like peaches from cans          Exercise:  Walking a little recently 15-20 min  Dietician visit: Most recent: several years ago.       Wt Readings from Last 3 Encounters:  08/10/16 285 lb (129.3 kg)  08/09/16 285 lb (129.3 kg)  06/14/16 283 lb (128.4 kg)   Glycemic control:   Lab Results  Component Value Date   HGBA1C 8.1 (H) 05/30/2016    HGBA1C 7.5 03/23/2016   HGBA1C 8.4 (H) 11/13/2015   Lab Results  Component Value Date   MICROALBUR 9.2 (H) 05/15/2015   LDLCALC 49 10/29/2013   CREATININE 2.45 (H) 08/05/2016            Lab Results  Component Value Date   FRUCTOSAMINE 328 (H) 08/05/2016   FRUCTOSAMINE 326 (H) 01/30/2014     Other active problems: See review of systems   Allergies as of 08/10/2016   No Known Allergies     Medication List       Accurate as of 08/10/16  3:00 PM. Always use your most recent med list.          ACCU-CHEK AVIVA device USE AS INSTRUCTED TO CHECK BLOOD SUGAR 6 TIMES PER DAY Dx: Code E11.9   ACCU-CHEK SOFTCLIX LANCETS lancets USE AS INSTRUCTED TO CHECK BLOOD SUGAR 6 TIMES PER DAY Dx: Code E11.9   aspirin EC 325 MG tablet Take 1 tablet (325 mg total) by mouth daily.   calcitRIOL 0.25 MCG capsule Commonly known as:  ROCALTROL Take 0.25 mcg by mouth daily.   carvedilol 25 MG tablet Commonly known as:  COREG Take 1 tablet (25 mg total) by mouth 2 (two) times daily with a meal.   doxazosin 4 MG tablet Commonly known as:  CARDURA Take 1 tablet (4 mg total) by mouth 2 (two) times daily.   Fish Oil 1000 MG Caps Take by mouth 2 (two) times daily.   glucose blood test strip Commonly known as:  BAYER CONTOUR NEXT TEST Use to test blood sugar 3 times daily    Dx code is E11.49   hydrocortisone cream 1 % Apply to affected area 2 times daily   insulin aspart 100 UNIT/ML injection Commonly known as:  NOVOLOG USE MAX 120 UNITS PER DAY WITH CONTINUOUS INSULIN PUMP. Dx code: E11.49   isosorbide mononitrate 30 MG 24 hr tablet Commonly known as:  IMDUR TAKE TWO TABLETS BY MOUTH ONCE DAILY   liraglutide 18 MG/3ML Sopn Commonly known as:  VICTOZA INJECT 0.3MLS (1.8 MG TOTAL) INTO THE SKIN DAILY   lisinopril 5 MG tablet Commonly known as:  PRINIVIL,ZESTRIL Take 5 mg by mouth daily.   pantoprazole 40 MG tablet Commonly known as:  PROTONIX TAKE ONE TABLET BY MOUTH ONCE  DAILY   sertraline 50 MG tablet Commonly known as:  ZOLOFT Take 1 tablet (50 mg total) by mouth daily.   torsemide 20 MG tablet Commonly known as:  DEMADEX TAKE TWO TABLETS BY MOUTH  DAILY  Allergies: No Known Allergies  Past Medical History:  Diagnosis Date  . Anemia, iron deficiency   . Anxiety   . Arthritis   . BPH (benign prostatic hypertrophy)   . CAD (coronary artery disease)    Nonobstructive CAD per cath  . Cardiac pacemaker in situ   . CHF (congestive heart failure) ([place])   . Chronic ulcer of right foot ([place])   . 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 Dyspnea   . History of cellulitis    right great toe 10-25-2014  . History of skin cancer   . Hypertension   . Insulin dependent type 2 diabetes mellitus ([place]) 1991   followd by [name]--  has insulin pump  . Insulin pump in place   . OSA on CPAP   . Peripheral neuropathy (HCC)    severe  . Peripheral vascular disease ([place])    bilateral lower extremities  . Secondary hyperparathyroidism of renal origin ([place])   . Sinus node dysfunction [facility])     Past Surgical History:  Procedure Laterality Date  . AMPUTATION OF REPLICATED TOES  Mar 2725   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)  . AMPUTATION TOE Right 03/12/2015   Procedure: RIGHT HALLUS AMPUTATION ;  Surgeon: [name], MD;  Location: [facility];  Service: Podiatry;  Laterality: Right;  . CARDIAC CATHETERIZATION  11-25-2010   [place], [place]   Nonobstructive CAD  . CARDIAC PACEMAKER PLACEMENT  Nov 2009   Medtronic  . EP IMPLANTABLE DEVICE N/A 06/03/2015   Procedure: PPM Generator Changeout;  Surgeon: [name], MD;  Location: [facility];  Service: Cardiovascular;  Laterality: N/A;  . EXCISION BONE CYST Right 03/06/2015   Procedure: BONE BIOPSIES OF RIGHT FOOT;  Surgeon: [name], MD;  Location: [facility];  Service: Podiatry;  Laterality: Right;  .  ORIF ANKLE FRACTURE Left 11/06/2014   Procedure: OPEN REDUCTION INTERNAL FIXATION (ORIF) LEFT  ANKLE FRACTURE;  Surgeon: [name], MD;  Location: [facility];  Service: Orthopedics;  Laterality: Left;  . TOTAL KNEE ARTHROPLASTY    . VEIN LIGATION AND STRIPPING      Family History  Problem Relation Age of Onset  . Cancer Mother     breast  . Heart attack Father     Social History:  reports that he quit smoking about 32 years ago. He has a 60.00 pack-year smoking history. He has quit using smokeless tobacco. He reports that he drinks about 0.6 oz of alcohol per week . He reports that he does not use drugs.    Review of Systems         Lipids: Had been on a Lipitor since late 2014. Also had taken Gembrozil for several years without apparent side effects.  Both of these were  stopped because of increased liver functions and LFT has been back to normal LDL has not been high subsequently but HDL is consistently low  He is taking  fish oil and LDL is within target but triglycerides are  high       Lab Results  Component Value Date   CHOL 152 08/05/2016   HDL 19.90 (L) 08/05/2016   LDLCALC 49 10/29/2013   LDLDIRECT 77.0 08/05/2016   TRIG 243.0 (H) 08/05/2016   CHOLHDL 8 08/05/2016      HYPERTENSION: The blood pressure has been treated with various drugs.   Currently taking carvedilol, lisinopril  5 mg and doxazosin 4 mg Twice a day  Chronic kidney disease: Etiology unknown, is followed by nephrologist  Last creatinine:  Lab Results  Component Value Date   CREATININE 2.45 (H) 08/05/2016        Has history of Numbness in his feet  since about 2013, no tingling or burning in feet    He has been wearing diabetic shoes.  He has been followed by podiatrist Including in March  He is  under the care of a podiatrist for Regular care  Only recently has had a small ulcer on the left toe     Physical Examination:  BP (!) 120/58   Pulse 73   Ht 6\' 4"  (1.93 m)    Wt 285 lb (129.3 kg)   SpO2 96%   BMI 34.69 kg/m   Trace lower leg edema present  Diabetic Foot Exam - Simple   Simple Foot Form Diabetic Foot exam was performed with the following findings:  Yes   Visual Inspection See comments:  Yes Sensation Testing See comments:  Yes Pulse Check See comments:  Yes Comments 2 toe Amputations present on the right foot Absent monofilament sensation in the distal feet Pedal Pulses Absent Has Very Shallow Noninfected Ulcer on the left second toe on the plantar surfaces     ASSESSMENT:  Diabetes type 2, uncontrolled with obesity See history of present illness for detailed discussion of current diabetes management, blood sugar patterns and problems identified   His blood sugars are overall still poorly controlled, Recently appear higher despite no change in his compliance  As discussed above his main problem is not bolusing when he needs to but also appears to be requiring a higher basal rate Discussed need to bolus regardless of blood sugar before meals Also does need to do correction boluses Day-to-day management again discussed in detail and emphasized the need for better control because of his complications He will also need to follow-up with nurse educator to make sure he is implementing the plan below  PLAN:    ViCTOZA: Need to increase the dose to 1.8 mg which is the highest dose on the pen  INSULIN on the pump:  It is very important to do boluses as follows  1.  Every time you check blood sugar unless the blood sugar is below 120 units need to take a correction bolus  2.  Every time you eat a meal or have any carbohydrates including fruit  will need to count carbohydrates and do a bolus  3.  Check the blood sugar 2-3 hours after a meal to help adjust to boluses   BASAL rates: Increase the basal rates   At midnight basal rate will be 3.0, BASAL rate at 6 AM will be 4.1 Please add a new basal rate at 5 PM which will be  4.1    Hypertension: Blood pressure is better with increasing Cardura to twice a day  LIPIDS: Again has mild increase in triglycerides but LDL is in the 70s   Counseling time on subjects discussed above is over 50% of today's 25 minute visit   Patient Instructions  Check blood sugars on waking up   daily  Also check blood sugars about 2 hours after a meal and do this after different meals by rotation  Recommended blood sugar levels on waking up is 90-130 and about 2 hours after meal is 130-160    ViCTOZa: Need to increase the dose to 1.8 mg which is the  highest dose on the pen  INSULIN on the pump:  It is very important to do boluses as follows   1.  Every time you check blood sugar unless the blood sugar is below 120 units need to take a correction bolus  2.  Every time you eat a meal or have any carbohydrates including fruit  will need to count carbohydrates and do a bolus  3.  Check the blood sugar 2-3 hours after a meal to help adjust to boluses   BASAL rates: Increase the basal rates   At midnight basal rate will be 3.0, BASAL rate at 6 AM will be 4.1 Please add a new basal rate at 5 PM which will be 4.1     Yessenia Maillet 08/10/2016, 3:00 PM   Note: This office note was prepared with Estate agent. Any transcriptional errors that result from this process are unintentional.

## 2016-08-10 NOTE — Patient Instructions (Addendum)
Check blood sugars on waking up   daily  Also check blood sugars about 2 hours after a meal and do this after different meals by rotation  Recommended blood sugar levels on waking up is 90-130 and about 2 hours after meal is 130-160    ViCTOZa: Need to increase the dose to 1.8 mg which is the highest dose on the pen  INSULIN on the pump:  It is very important to do boluses as follows   1.  Every time you check blood sugar unless the blood sugar is below 120 units need to take a correction bolus  2.  Every time you eat a meal or have any carbohydrates including fruit  will need to count carbohydrates and do a bolus  3.  Check the blood sugar 2-3 hours after a meal to help adjust to boluses   BASAL rates: Increase the basal rates   At midnight basal rate will be 3.0, BASAL rate at 6 AM will be 4.1 Please add a new basal rate at 5 PM which will be 4.1

## 2016-08-20 ENCOUNTER — Other Ambulatory Visit: Payer: Self-pay | Admitting: Endocrinology

## 2016-08-21 ENCOUNTER — Other Ambulatory Visit: Payer: Self-pay | Admitting: Endocrinology

## 2016-08-22 NOTE — Telephone Encounter (Signed)
[place],  I faxed this patient's paperwork to [facility]/ Dr. Broadus John in November and he said her never heard from them.  This has been a drawn out process, can you please call him and tell him the process has changed and that we will set him up to meet Dr. Broadus John in the office to talk further about the testing.   Thanks!

## 2016-08-23 ENCOUNTER — Ambulatory Visit (INDEPENDENT_AMBULATORY_CARE_PROVIDER_SITE_OTHER): Payer: Medicare Other | Admitting: Sports Medicine

## 2016-08-23 ENCOUNTER — Telehealth: Payer: Self-pay | Admitting: Cardiology

## 2016-08-23 ENCOUNTER — Encounter: Payer: Self-pay | Admitting: Sports Medicine

## 2016-08-23 ENCOUNTER — Ambulatory Visit (INDEPENDENT_AMBULATORY_CARE_PROVIDER_SITE_OTHER): Payer: Medicare Other | Admitting: *Deleted

## 2016-08-23 ENCOUNTER — Ambulatory Visit (INDEPENDENT_AMBULATORY_CARE_PROVIDER_SITE_OTHER): Payer: Medicare Other

## 2016-08-23 DIAGNOSIS — M79675 Pain in left toe(s): Secondary | ICD-10-CM | POA: Diagnosis not present

## 2016-08-23 DIAGNOSIS — M2042 Other hammer toe(s) (acquired), left foot: Secondary | ICD-10-CM | POA: Diagnosis not present

## 2016-08-23 DIAGNOSIS — M2040 Other hammer toe(s) (acquired), unspecified foot: Secondary | ICD-10-CM

## 2016-08-23 DIAGNOSIS — E0842 Diabetes mellitus due to underlying condition with diabetic polyneuropathy: Secondary | ICD-10-CM | POA: Diagnosis not present

## 2016-08-23 DIAGNOSIS — I495 Sick sinus syndrome: Secondary | ICD-10-CM

## 2016-08-23 DIAGNOSIS — L97521 Non-pressure chronic ulcer of other part of left foot limited to breakdown of skin: Secondary | ICD-10-CM

## 2016-08-23 MED ORDER — MUPIROCIN CALCIUM 2 % EX CREA: 1.0000 "application " | TOPICAL_CREAM | Freq: Every day | CUTANEOUS | 0 refills | Status: DC

## 2016-08-23 NOTE — Progress Notes (Signed)
Subjective: David [name] is a 76 y.o. male patient seen in office for evaluation of ulceration of the left 3rd toe. Patient has a history of diabetes and a blood glucose level today of 141 mg/dl. Patient is changing the dressing using Bactroban at home with help from wife and it closed up but now has re-opened; Denies nausea/fever/vomiting/chills/night sweats/shortness of breath/pain. Patient has no other pedal complaints at this time.  Patient Active Problem List   Diagnosis Date Noted  . Chronic ulcer of right foot ([place]) 08/09/2016  . Otitis, externa, infective 03/11/2016  . Rib fracture 07/24/2015  . Syncope 07/24/2015  . Sinus node arrhythmia ([place]) 06/03/2015  . 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 Osteomyelitis of foot, right, acute ([place]) 05/04/2015  . Ankle fracture, left 10/29/2014  . OSA on CPAP 10/26/2014  . Back pain 10/16/2014  . Left leg numbness 10/16/2014  . Fatigue 10/16/2014  . Impacted cerumen of both ears 10/16/2014  . Episodic lightheadedness 08/04/2014  . Open toe wound 07/18/2014  . Diabetes mellitus with neurological manifestations, uncontrolled ([place]) 02/27/2014  . Mixed hyperlipidemia 02/27/2014  . Seasonal and perennial allergic rhinitis 10/25/2013  . Essential hypertension 10/25/2013  . Pure hypercholesterolemia 10/25/2013  . Obesity 10/25/2013  . Renal insufficiency 10/25/2013   Current Outpatient Prescriptions on File Prior to Visit  Medication Sig Dispense Refill  . ACCU-CHEK SOFTCLIX LANCETS lancets USE AS INSTRUCTED TO CHECK BLOOD SUGAR 6 TIMES PER DAY Dx: Code E11.9 300 each 2  . aspirin EC 325 MG tablet Take 1 tablet (325 mg total) by mouth daily. (Patient taking differently: Take 81 mg by mouth daily. ) 42 tablet 0  . Blood Glucose Monitoring Suppl (ACCU-CHEK AVIVA) device USE AS INSTRUCTED TO CHECK BLOOD SUGAR 6 TIMES PER DAY Dx: Code E11.9 1 each 0  . calcitRIOL (ROCALTROL) 0.25 MCG capsule Take 0.25 mcg by mouth daily.     . carvedilol (COREG) 25 MG tablet  Take 1 tablet (25 mg total) by mouth 2 (two) times daily with a meal. 90 tablet 1  . doxazosin (CARDURA) 4 MG tablet Take 1 tablet (4 mg total) by mouth 2 (two) times daily. 180 tablet 1  . doxazosin (CARDURA) 4 MG tablet TAKE ONE TABLET BY MOUTH ONCE DAILY 90 tablet 0  . glucose blood (BAYER CONTOUR NEXT TEST) test strip Use to test blood sugar 3 times daily    Dx code is E11.49 100 each 3  . hydrocortisone cream 1 % Apply to affected area 2 times daily 15 g 0  . insulin aspart (NOVOLOG) 100 UNIT/ML injection USE MAX 120 UNITS PER DAY WITH CONTINUOUS INSULIN PUMP. Dx code: E11.49 40 mL 5  . isosorbide mononitrate (IMDUR) 30 MG 24 hr tablet TAKE TWO TABLETS BY MOUTH ONCE DAILY 60 tablet 11  . liraglutide (VICTOZA) 18 MG/3ML SOPN INJECT 0.3MLS (1.8 MG TOTAL) INTO THE SKIN DAILY 9 pen 1  . lisinopril (PRINIVIL,ZESTRIL) 5 MG tablet Take 5 mg by mouth daily.    Marland Kitchen NOVOLOG 100 UNIT/ML injection USE MAX 120 UNITS PER DAY WITH CONTINUOUS INSULIN PUMP 40 mL 5  . Omega-3 Fatty Acids (FISH OIL) 1000 MG CAPS Take by mouth 2 (two) times daily.    . pantoprazole (PROTONIX) 40 MG tablet TAKE ONE TABLET BY MOUTH ONCE DAILY 90 tablet 1  . sertraline (ZOLOFT) 50 MG tablet Take 1 tablet (50 mg total) by mouth daily. 30 tablet 11  . torsemide (DEMADEX) 20 MG tablet TAKE TWO TABLETS BY MOUTH  DAILY 180 tablet  1   No current facility-administered medications on file prior to visit.    No Known Allergies  Recent Results (from the past 2160 hour(s))  Hemoglobin A1c     Status: Abnormal   Collection Time: 05/30/16 10:00 AM  Result Value Ref Range   Hgb A1c MFr Bld 8.1 (H) 4.6 - 6.5 %    Comment: Glycemic Control Guidelines for People with Diabetes:Non Diabetic:  <6%Goal of Therapy: <7%Additional Action Suggested:  >9%   Basic metabolic panel     Status: Abnormal   Collection Time: 05/30/16 10:00 AM  Result Value Ref Range   Sodium 135 135 - 145 mEq/L   Potassium 4.6 3.5 - 5.1 mEq/L   Chloride 104 96 - 112 mEq/L    CO2 25 19 - 32 mEq/L   Glucose, Bld 282 (H) 70 - 99 mg/dL   BUN 29 (H) 6 - 23 mg/dL   Creatinine, Ser 1.62 (H) 0.40 - 1.50 mg/dL   Calcium 9.7 8.4 - 10.5 mg/dL   GFR 44.28 (L) >60.00 mL/min  HM DIABETES EYE EXAM     Status: None   Collection Time: 06/03/16 11:25 AM  Result Value Ref Range   HM Diabetic Eye Exam  No Retinopathy  Basic metabolic panel     Status: Abnormal   Collection Time: 08/05/16 10:16 AM  Result Value Ref Range   Sodium 136 135 - 145 mEq/L   Potassium 5.4 (H) 3.5 - 5.1 mEq/L   Chloride 105 96 - 112 mEq/L   CO2 25 19 - 32 mEq/L   Glucose, Bld 263 (H) 70 - 99 mg/dL   BUN 47 (H) 6 - 23 mg/dL   Creatinine, Ser 2.45 (H) 0.40 - 1.50 mg/dL   Calcium 9.3 8.4 - 10.5 mg/dL   GFR 27.46 (L) >60.00 mL/min  Fructosamine     Status: Abnormal   Collection Time: 08/05/16 10:16 AM  Result Value Ref Range   Fructosamine 328 (H) 0 - 285 umol/L    Comment: Published reference interval for apparently healthy subjects between age 68 and 20 is 26 - 285 umol/L and in a poorly controlled diabetic population is 228 - 563 umol/L with a mean of 396 umol/L.   Lipid panel     Status: Abnormal   Collection Time: 08/05/16 10:16 AM  Result Value Ref Range   Cholesterol 152 0 - 200 mg/dL    Comment: ATP III Classification       Desirable:  < 200 mg/dL               Borderline High:  200 - 239 mg/dL          High:  > = 240 mg/dL   Triglycerides 243.0 (H) 0.0 - 149.0 mg/dL    Comment: Normal:  <150 mg/dLBorderline High:  150 - 199 mg/dL   HDL 19.90 (L) >39.00 mg/dL   VLDL 48.6 (H) 0.0 - 40.0 mg/dL   Total CHOL/HDL Ratio 8     Comment:                Men          Women1/2 Average Risk     3.4          3.3Average Risk          5.0          4.42X Average Risk          9.6          7.13X Average  Risk          15.0          11.0                       NonHDL 132.27     Comment: NOTE:  Non-HDL goal should be 30 mg/dL higher than patient's LDL goal (i.e. LDL goal of < 70 mg/dL, would have  non-HDL goal of < 100 mg/dL)  LDL cholesterol, direct     Status: None   Collection Time: 08/05/16 10:16 AM  Result Value Ref Range   Direct LDL 77.0 mg/dL    Comment: Optimal:  <100 mg/dLNear or Above Optimal:  100-129 mg/dLBorderline High:  130-159 mg/dLHigh:  160-189 mg/dLVery High:  >190 mg/dL    Objective: There were no vitals filed for this visit.  General: Patient is awake, alert, oriented x 3 and in no acute distress.  Dermatology: Skin is warm and dry bilateral with a partial thickness ulceration present left 3rd toe distal tuft. Ulceration measures 0.5cm x 0.5 cm x 0.1cm. There is a keratotic border with a granular base. The ulceration does not probe to bone. There is no malodor, no active drainage, no erythema, no edema. No acute signs of infection.   Vascular: Dorsalis Pedis pulse = 1/4 Bilateral,  Posterior Tibial pulse = 1/4 Bilateral,  Capillary Fill Time < 5 seconds  Neurologic: Protective sensation absent to the level of knee using  the 5.07/10g [employer].  Musculosketal:  No Pain with palpation to ulcerated area. No pain with compression to calves bilateral. Hammertoe and history of amp on right.  Xrays, Left foot:At 3rd toe No bony destruction suggestive of osteomyelitis. Diffuse arthritis. No gas in soft tissues. Ankle hardware intact.  Assessment and Plan:  Problem List Items Addressed This Visit    None    Visit Diagnoses    Toe ulcer, left, limited to breakdown of skin (HCC)    -  Primary   Relevant Medications   mupirocin cream (BACTROBAN) 2 %   Pain of toe of left foot       Relevant Orders   DG Foot Complete Left (Completed)   Hammertoe of left foot       Diabetic polyneuropathy associated with diabetes mellitus due to underlying condition ([place])           -Examined patient and discussed the progression of the wound and treatment alternatives. -Xrays reviewed - Excisionally dedbrided ulceration to healthy bleeding borders using a  sterile chisel  blade. -Applied antibiotic cream and bandaid dressing and instructed patient to continue with daily dressings at home consisting of bactroban as Rx and bandaid/dry sterile dressing. -Gave toe crest pad  - Advised patient to go to the ER or return to office if the wound worsens or if constitutional symptoms are present. -Safe step diabetic shoe order form was completed; office to contact primary care for approval / certification;  Office to arrange shoe fitting and dispensing. Patient met with Liliane Channel.  -Patient to return to office in 3 weeks for follow up care and evaluation or sooner if problems arise.  Landis Martins, DPM

## 2016-08-23 NOTE — Telephone Encounter (Signed)
LMOVM reminding pt to send remote transmission.   

## 2016-08-23 NOTE — Progress Notes (Signed)
Remote pacemaker transmission.   

## 2016-08-23 NOTE — Progress Notes (Signed)
   Subjective:    Patient ID: David [name], male    DOB: 1940/06/17, 76 y.o.   MRN: [id number]  HPI  Chief Complaint  Patient presents with  . Skin Lesion    Left; 3rd toe; "tip of toe" x 2 months. Pt's wife states that the patient has had 3 amputations on the right foot. Pt has had bone infections in the right foot.        Review of Systems     Objective:   Physical Exam        Assessment & Plan:

## 2016-08-24 ENCOUNTER — Other Ambulatory Visit: Payer: Self-pay

## 2016-08-24 ENCOUNTER — Encounter: Payer: Self-pay | Admitting: Cardiology

## 2016-08-24 MED ORDER — INSULIN ASPART 100 UNIT/ML ~~LOC~~ SOLN: SUBCUTANEOUS | 3 refills | Status: DC

## 2016-08-26 LAB — CUP PACEART REMOTE DEVICE CHECK
Battery Remaining Longevity: 85 mo
Battery Voltage: 3 V
Brady Statistic AP VP Percent: 0.05 %
Brady Statistic AP VS Percent: 99.55 %
Brady Statistic AS VP Percent: 0 %
Brady Statistic AS VS Percent: 0.4 %
Brady Statistic RA Percent Paced: 99.59 %
Brady Statistic RV Percent Paced: 0.05 %
Date Time Interrogation Session: 20180410144022
Implantable Lead Implant Date: 20091102
Implantable Lead Implant Date: 20091102
Implantable Lead Location: 753859
Implantable Lead Location: 753860
Implantable Lead Model: 5076
Implantable Lead Model: 5076
Implantable Pulse Generator Implant Date: 20170118
Lead Channel Impedance Value: 380 Ohm
Lead Channel Impedance Value: 399 Ohm
Lead Channel Impedance Value: 513 Ohm
Lead Channel Impedance Value: 513 Ohm
Lead Channel Pacing Threshold Amplitude: 1 V
Lead Channel Pacing Threshold Amplitude: 2.25 V
Lead Channel Pacing Threshold Pulse Width: 0.4 ms
Lead Channel Pacing Threshold Pulse Width: 0.4 ms
Lead Channel Sensing Intrinsic Amplitude: 1.375 mV
Lead Channel Sensing Intrinsic Amplitude: 1.375 mV
Lead Channel Sensing Intrinsic Amplitude: 21.625 mV
Lead Channel Sensing Intrinsic Amplitude: 21.625 mV
Lead Channel Setting Pacing Amplitude: 2.5 V
Lead Channel Setting Pacing Amplitude: 2.5 V
Lead Channel Setting Pacing Pulse Width: 0.4 ms
Lead Channel Setting Sensing Sensitivity: 2.8 mV

## 2016-09-06 ENCOUNTER — Other Ambulatory Visit: Payer: Self-pay | Admitting: Endocrinology

## 2016-09-06 DIAGNOSIS — Z794 Long term (current) use of insulin: Secondary | ICD-10-CM

## 2016-09-06 DIAGNOSIS — E1165 Type 2 diabetes mellitus with hyperglycemia: Secondary | ICD-10-CM

## 2016-09-07 ENCOUNTER — Encounter: Payer: Medicare Other | Attending: Endocrinology | Admitting: Nutrition

## 2016-09-07 DIAGNOSIS — E1165 Type 2 diabetes mellitus with hyperglycemia: Secondary | ICD-10-CM | POA: Insufficient documentation

## 2016-09-07 DIAGNOSIS — Z713 Dietary counseling and surveillance: Secondary | ICD-10-CM | POA: Insufficient documentation

## 2016-09-07 DIAGNOSIS — Z794 Long term (current) use of insulin: Secondary | ICD-10-CM | POA: Insufficient documentation

## 2016-09-07 DIAGNOSIS — E1149 Type 2 diabetes mellitus with other diabetic neurological complication: Secondary | ICD-10-CM

## 2016-09-07 DIAGNOSIS — IMO0002 Reserved for concepts with insufficient information to code with codable children: Secondary | ICD-10-CM

## 2016-09-07 NOTE — Progress Notes (Signed)
Pt. Did not make changes to his pump settings after last visit with Dr. Dwyane Dee.  He did not bring meter.  Says FBSs are in the low 200s, Says is boluses for all meals/snacks eaten, and that he is testing his blood sugars before all meal times and doing correction doses when not eating.   I made the changes to his basal rates, and he was given my telephone number to call to let me know if they are any better.   He says he has no problems counting carbs.  Carb count at breakfast today was correct.  Says acL today was 201.   He says he will be going outside and working more, now that the weather is getting better.  He says that exercise works well to lower his readings, but denies any low blood sugars.  Says they are in the low 100s when he works outside for 1 hour.  Suggested outside walking, or working QD for al least 30 min.Marland Kitchenevery day.

## 2016-09-07 NOTE — Patient Instructions (Signed)
Test blood sugars before meals and at bedtime. Call readings in one week.  [phone] Do correction boluses when not eating a meal.

## 2016-09-08 ENCOUNTER — Ambulatory Visit (INDEPENDENT_AMBULATORY_CARE_PROVIDER_SITE_OTHER): Payer: Medicare Other | Admitting: Genetic Counselor

## 2016-09-08 ENCOUNTER — Encounter: Payer: Self-pay | Admitting: Cardiology

## 2016-09-08 DIAGNOSIS — Z7183 Encounter for nonprocreative genetic counseling: Secondary | ICD-10-CM

## 2016-09-08 NOTE — Progress Notes (Addendum)
Patient presented today for pre-test genetic counseling of HCM. Labs were drawn for genetic testing. Detailed notes and pedigree will be attached soon.  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 He was counseled on the genetics of hypertrophic cardiomyopathy (HCM), namely inheritance, incomplete penetrance, variable expression and digenic/compound mutations that can be seen in some patients with HCM. We also briefly discussed the infiltrative cardiomyopathies, their inheritance pattern and treatment plans. We walked through the process of genetic testing. The potential outcomes of genetic testing and subsequent management of at-risk family members were discussed so as to manage expectations. His medical and 5-generation family history was obtained. See details below  David [name] (III.1 on pedigree) is a pleasant 76 year old gentleman who is here at the clinic with his wife, David [name]. 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 He experienced a syncopal episode last year when he walked to the bedroom and blacked out. He also broke a couple of ribs at that time. He mentions having prior symptoms, about 15 years ago, of heart flutters and fatigue. He reports not having chest pain, or dizzy spells. At age 79, he had severe atrial fibrillation and a pacemaker was implanted at that time. This was replaced last year. He also reports that about six years ago, he was found to have kidney disease. His blood pressure was elevated at that time and is now controlled by medication.   Family history David [name] (III.1) is the oldest of three siblings. He has two younger sisters (III.2, III.3) and both do not have overt heart disease. His youngest sister (III.3) was diagnosed with breast cancer and has breathing problems. Her echocardiogram screening in 2017 was normal. His sister's kids (IV.4-IV.10) are in relatively  good health and do not have heart disease.  David [name] has three children, ages 73, 32 and 80 (IV.1-IV.3). He informs me that his middle daughter (IV.2) does have some heart disease. She was diagnosed with Afib at age 46 and had an ablation done soon after. He believes that she had previous Afib events, which were at that time, thought to be anxiety attacks.  David [name]'s dad  (II.3) was found to have laryngeal cancer. He was hospitalized for surgery but died the day before his surgery from a heart attack. He reportedly, had no heart issues until then. David [name] states that his paternal grandmother (1.2) also passed away form a heart attack at age 63 and had heart problems most of her life.   Other family members with heart disease include his two maternal uncles (II.7, II.8) who also passed away from a heart attack. He is not aware of the circumstances surrounding their death or their age of death. He brings up the early death of his maternal grandmother who died at age 26. She lived in [place] at that time and he does not know the cause of her death.  All other family members in the first two generations passed away from other conditions.  Impression and Plans  In summary, David [name] presents with severe left ventricular hypertrophy in spite of having hypertension, a risk factor for HCM. His disease presentation is unlike that of patients with uncontrolled hypertension. Additionally, he reports other family members with heart disease including his daughter who underwent an ablation for Afib about 6 years ago.   In light of his disease presentation and underlying hypertensive disease, it may be worth considering HCM genetic testing to confirm his diagnosis. However, the  yield of testing will be quite low, considering his advanced age of presentation and no family history of sudden death or HCM. This test includes genes that encode components of the sarcomeric contractile apparatus and HCM phenocopies. Genetic testing  will confirm the diagnosis and identify the genetic basis of the disease. It will also help identify first-degree family members (siblings and children) that may harbor the mutation. As HCM is an autosomal dominant disorder, his children are at a 50% risk of inheriting his condition. Genetic testing his children for the familial pathogenic variant will help stratify risk for HCM in the family. Appropriate cardiology follow-up and lifestyle management can then be directed to genotype-positive family members.

## 2016-09-09 ENCOUNTER — Other Ambulatory Visit: Payer: Self-pay | Admitting: Internal Medicine

## 2016-09-13 ENCOUNTER — Ambulatory Visit (INDEPENDENT_AMBULATORY_CARE_PROVIDER_SITE_OTHER): Payer: Medicare Other | Admitting: Sports Medicine

## 2016-09-13 ENCOUNTER — Encounter: Payer: Self-pay | Admitting: Sports Medicine

## 2016-09-13 DIAGNOSIS — M79675 Pain in left toe(s): Secondary | ICD-10-CM

## 2016-09-13 DIAGNOSIS — M2042 Other hammer toe(s) (acquired), left foot: Secondary | ICD-10-CM

## 2016-09-13 DIAGNOSIS — L97521 Non-pressure chronic ulcer of other part of left foot limited to breakdown of skin: Secondary | ICD-10-CM

## 2016-09-13 DIAGNOSIS — E0842 Diabetes mellitus due to underlying condition with diabetic polyneuropathy: Secondary | ICD-10-CM

## 2016-09-13 DIAGNOSIS — S90425A Blister (nonthermal), left lesser toe(s), initial encounter: Secondary | ICD-10-CM

## 2016-09-13 NOTE — Progress Notes (Signed)
Subjective: David [name] is a 76 y.o. male patient seen in office for evaluation of ulceration of the left 3rd toe and NEW BLISTER LEFT 2ND TOE. Patient has a history of diabetes and a blood glucose level today of 150 mg/dl. Patient is changing the dressing using Bactroban at home with help from wife. Denies nausea/fever/vomiting/chills/night sweats/shortness of breath/pain. Patient has no other pedal complaints at this time.  Patient Active Problem List   Diagnosis Date Noted  . 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 Otitis, externa, infective 03/11/2016  . Rib fracture 07/24/2015  . Syncope 07/24/2015  . 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 Osteomyelitis of foot, right, acute ([place]) 05/04/2015  . Ankle fracture, left 10/29/2014  . OSA on CPAP 10/26/2014  . Back pain 10/16/2014  . Left leg numbness 10/16/2014  . Fatigue 10/16/2014  . Impacted cerumen of both ears 10/16/2014  . Episodic lightheadedness 08/04/2014  . Open toe wound 07/18/2014  . Diabetes mellitus with neurological manifestations, uncontrolled ([place]) 02/27/2014  . Mixed hyperlipidemia 02/27/2014  . Seasonal and perennial allergic rhinitis 10/25/2013  . Essential hypertension 10/25/2013  . Pure hypercholesterolemia 10/25/2013  . Obesity 10/25/2013  . Renal insufficiency 10/25/2013   Current Outpatient Prescriptions on File Prior to Visit  Medication Sig Dispense Refill  . ACCU-CHEK SOFTCLIX LANCETS lancets USE AS INSTRUCTED TO CHECK BLOOD SUGAR 6 TIMES PER DAY Dx: Code E11.9 300 each 2  . aspirin EC 325 MG tablet Take 1 tablet (325 mg total) by mouth daily. (Patient taking differently: Take 81 mg by mouth daily. ) 42 tablet 0  . Blood Glucose Monitoring Suppl (ACCU-CHEK AVIVA) device USE AS INSTRUCTED TO CHECK BLOOD SUGAR 6 TIMES PER DAY Dx: Code E11.9 1 each 0  . calcitRIOL (ROCALTROL) 0.25 MCG capsule Take 0.25 mcg by mouth daily.     . carvedilol (COREG) 25 MG tablet Take 1 tablet  (25 mg total) by mouth 2 (two) times daily with a meal. 90 tablet 1  . doxazosin (CARDURA) 4 MG tablet Take 1 tablet (4 mg total) by mouth 2 (two) times daily. 180 tablet 1  . doxazosin (CARDURA) 4 MG tablet TAKE ONE TABLET BY MOUTH ONCE DAILY 90 tablet 0  . glucose blood (BAYER CONTOUR NEXT TEST) test strip Use to test blood sugar 3 times daily    Dx code is E11.49 100 each 3  . hydrocortisone cream 1 % Apply to affected area 2 times daily 15 g 0  . insulin aspart (NOVOLOG) 100 UNIT/ML injection USE MAX 120 UNITS PER DAY WITH CONTINUOUS INSULIN PUMP. Dx code: E11.49 40 mL 5  . insulin aspart (NOVOLOG) 100 UNIT/ML injection USE MAX 120 UNITS PER DAY WITH CONTINUOUS INSULIN PUMP DX CODE E11.65 60 mL 3  . isosorbide mononitrate (IMDUR) 30 MG 24 hr tablet TAKE TWO TABLETS BY MOUTH ONCE DAILY 60 tablet 11  . liraglutide (VICTOZA) 18 MG/3ML SOPN INJECT 0.3MLS (1.8 MG TOTAL) INTO THE SKIN DAILY 9 pen 1  . lisinopril (PRINIVIL,ZESTRIL) 5 MG tablet Take 5 mg by mouth daily.    . mupirocin cream (BACTROBAN) 2 % Apply 1 application topically daily. Toe ulcer 15 g 0  . Omega-3 Fatty Acids (FISH OIL) 1000 MG CAPS Take by mouth 2 (two) times daily.    . pantoprazole (PROTONIX) 40 MG tablet TAKE ONE TABLET BY MOUTH ONCE DAILY 90 tablet 1  . sertraline (ZOLOFT) 50 MG tablet TAKE ONE TABLET BY MOUTH ONCE DAILY 90 tablet  2  . torsemide (DEMADEX) 20 MG tablet TAKE TWO TABLETS BY MOUTH  DAILY 180 tablet 1   No current facility-administered medications on file prior to visit.    No Known Allergies  Recent Results (from the past 2160 hour(s))  Basic metabolic panel     Status: Abnormal   Collection Time: 08/05/16 10:16 AM  Result Value Ref Range   Sodium 136 135 - 145 mEq/L   Potassium 5.4 (H) 3.5 - 5.1 mEq/L   Chloride 105 96 - 112 mEq/L   CO2 25 19 - 32 mEq/L   Glucose, Bld 263 (H) 70 - 99 mg/dL   BUN 47 (H) 6 - 23 mg/dL   Creatinine, Ser 2.45 (H) 0.40 - 1.50 mg/dL   Calcium 9.3 8.4 - 10.5 mg/dL   GFR  27.46 (L) >60.00 mL/min  Fructosamine     Status: Abnormal   Collection Time: 08/05/16 10:16 AM  Result Value Ref Range   Fructosamine 328 (H) 0 - 285 umol/L    Comment: Published reference interval for apparently healthy subjects between age 44 and 40 is 66 - 285 umol/L and in a poorly controlled diabetic population is 228 - 563 umol/L with a mean of 396 umol/L.   Lipid panel     Status: Abnormal   Collection Time: 08/05/16 10:16 AM  Result Value Ref Range   Cholesterol 152 0 - 200 mg/dL    Comment: ATP III Classification       Desirable:  < 200 mg/dL               Borderline High:  200 - 239 mg/dL          High:  > = 240 mg/dL   Triglycerides 243.0 (H) 0.0 - 149.0 mg/dL    Comment: Normal:  <150 mg/dLBorderline High:  150 - 199 mg/dL   HDL 19.90 (L) >39.00 mg/dL   VLDL 48.6 (H) 0.0 - 40.0 mg/dL   Total CHOL/HDL Ratio 8     Comment:                Men          Women1/2 Average Risk     3.4          3.3Average Risk          5.0          4.42X Average Risk          9.6          7.13X Average Risk          15.0          11.0                       NonHDL 132.27     Comment: NOTE:  Non-HDL goal should be 30 mg/dL higher than patient's LDL goal (i.e. LDL goal of < 70 mg/dL, would have non-HDL goal of < 100 mg/dL)  LDL cholesterol, direct     Status: None   Collection Time: 08/05/16 10:16 AM  Result Value Ref Range   Direct LDL 77.0 mg/dL    Comment: Optimal:  <100 mg/dLNear or Above Optimal:  100-129 mg/dLBorderline High:  130-159 mg/dLHigh:  160-189 mg/dLVery High:  >190 mg/dL  CUP PACEART REMOTE DEVICE CHECK     Status: None   Collection Time: 08/23/16  2:40 PM  Result Value Ref Range   Date Time Interrogation Session [id number]    Pulse Generator  Manufacturer MERM    Pulse Gen Model [id number] Advisa DR MRI    Pulse Gen Serial Number [id number]    Clinic Name [facility]    Implantable Pulse Generator Type Implantable Pulse Generator    Implantable Pulse Generator Implant  Date [id number]    Implantable Lead Manufacturer MERM    Implantable Lead Model 5076 CapSureFix Novus    Implantable Lead Serial Number [id number]    Implantable Lead Implant Date [id number]    Implantable Lead Location Detail 1 UNKNOWN    Implantable Lead Location [id number]    Implantable Lead Manufacturer MERM    Implantable Lead Model 5076 CapSureFix Novus    Implantable Lead Serial Number [id number]    Implantable Lead Implant Date 09323557    Implantable Lead Location Detail 1 UNKNOWN    Implantable Lead Location [id number]    Lead Channel Setting Sensing Sensitivity 2.8 mV   Lead Channel Setting Pacing Amplitude 2.5 V   Lead Channel Setting Pacing Pulse Width 0.4 ms   Lead Channel Setting Pacing Amplitude 2.5 V   Lead Channel Impedance Value 513 ohm   Lead Channel Impedance Value 380 ohm   Lead Channel Sensing Intrinsic Amplitude 1.375 mV   Lead Channel Sensing Intrinsic Amplitude 1.375 mV   Lead Channel Pacing Threshold Amplitude 2.25 V   Lead Channel Pacing Threshold Pulse Width 0.4 ms   Lead Channel Impedance Value 513 ohm   Lead Channel Impedance Value 399 ohm   Lead Channel Sensing Intrinsic Amplitude 21.625 mV   Lead Channel Sensing Intrinsic Amplitude 21.625 mV   Lead Channel Pacing Threshold Amplitude 1 V   Lead Channel Pacing Threshold Pulse Width 0.4 ms   Battery Status OK    Battery Remaining Longevity 85 mo   Battery Voltage 3.00 V   Brady Statistic RA Percent Paced 99.59 %   Brady Statistic RV Percent Paced 0.05 %   Brady Statistic AP VP Percent 0.05 %   Brady Statistic AS VP Percent 0 %   Brady Statistic AP VS Percent 99.55 %   Brady Statistic AS VS Percent 0.40 %   Eval Rhythm Ap/Vs     Objective: There were no vitals filed for this visit.  General: Patient is awake, alert, oriented x 3 and in no acute distress.  Dermatology: Skin is warm and dry bilateral with a partial thickness ulceration present left 3rd toe distal tuft. Ulceration measures 0.4cm x 0.5 cm x  0.1cm. There is a keratotic border with a granular base. The ulceration does not probe to bone. There is no malodor, no active drainage, no erythema, no edema. No acute signs of infection. There is a new blood blister at 2nd toe with no sign of infection on left.    Vascular: Dorsalis Pedis pulse = 1/4 Bilateral,  Posterior Tibial pulse = 1/4 Bilateral,  Capillary Fill Time < 5 seconds  Neurologic: Protective sensation absent to the level of knee using  the 5.07/10g [employer].  Musculosketal:  No Pain with palpation to ulcerated area. No pain with compression to calves bilateral. Hammertoe and history of amp on right.  Assessment and Plan:  Problem List Items Addressed This Visit    None    Visit Diagnoses    Toe ulcer, left, limited to breakdown of skin ([place])    -  Primary   Pain of toe of left foot       Hammertoe of left foot       Diabetic polyneuropathy associated with diabetes  mellitus due to underlying condition ([place])       Blister of toe of left foot, initial encounter       2nd toe       -Examined patient and discussed the progression of the wound and treatment alternatives. - Excisionally dedbrided ulceration to healthy bleeding borders using a sterile chisel  Blade and removed blister roof on left 2nd toe -Applied antibiotic cream and bandaid dressing and instructed patient to continue with daily dressings at home consisting of bactroban as Rx and bandaid/dry sterile dressing. -Continue with toe crest pad as tolerated  - Advised patient to go to the ER or return to office if the wound worsens or if constitutional symptoms are present. -Awaiting diabetic shoes  -Patient to return to office in 3 weeks for follow up care and evaluation or sooner if problems arise.May consider starting patient on oral antibiotic and trying PRISMA if not healed.   Landis Martins, DPM

## 2016-09-14 ENCOUNTER — Other Ambulatory Visit: Payer: Self-pay | Admitting: Endocrinology

## 2016-09-14 ENCOUNTER — Encounter: Payer: Self-pay | Admitting: Internal Medicine

## 2016-09-15 ENCOUNTER — Ambulatory Visit (INDEPENDENT_AMBULATORY_CARE_PROVIDER_SITE_OTHER): Payer: Medicare Other | Admitting: Internal Medicine

## 2016-09-15 ENCOUNTER — Encounter: Payer: Self-pay | Admitting: Internal Medicine

## 2016-09-15 VITALS — BP 116/68 | HR 78 | Ht 76.0 in | Wt 283.0 lb

## 2016-09-15 DIAGNOSIS — Z9989 Dependence on other enabling machines and devices: Secondary | ICD-10-CM | POA: Diagnosis not present

## 2016-09-15 DIAGNOSIS — G4733 Obstructive sleep apnea (adult) (pediatric): Secondary | ICD-10-CM

## 2016-09-15 NOTE — Patient Instructions (Signed)
Order- [facility]ME [facility]- Please change AutoPAP range to 10-20, continue mask of choice, humidifier, supplies, AirView, dx OSA  Please call if we can help

## 2016-09-15 NOTE — Progress Notes (Signed)
HPI male former smoker followed for OSA, K by HBP, morbid obesity, DM 2, pacemaker/sinus node arrhythmia Unattended Home Sleep Test 06/29/2015-AHI 25.3/hour, desaturation to 84%, body weight 282 pounds  ---------------------------------------------------------------------------------------------------------------  10/19/2015-76 year old male former smoker followed for OSA, K by HBP, morbid obesity, DM 2, pacemaker/sinus node arrhythmia CPAP auto 5-20/[facility] FOLLOWS FOR: DME [facility]. Pt wears CPAP nightly for about 8-9 hours. DL is attached. Download looks good. He says he sleeps well with CPAP.  09/15/16- 76 year old male former smoker followed for OSA, K by HBP, morbid obesity, DM 2, pacemaker/sinus node arrhythmia CPAP auto 5-20/[facility] FOLLOWS FOR: DME: [facility]. Pt wears CPAP nightly and no new supplies needed at this time. Pt states pressure is not enough when first lying down with CPAP. DL attached. He is pleased that he sleeps better and feels better with CPAP. Download could 100% 4 hour, AHI 0.8/hour. Median pressure 11.2. He thinks the pressure starts a little too low and we discussed available range with AutoPap.  ROS-see HPI   Negative unless "+" Constitutional:    weight loss, night sweats, fevers, chills, fatigue, lassitude. HEENT:    headaches, difficulty swallowing, tooth/dental problems, sore throat,       sneezing, itching, ear ache, nasal congestion, post nasal drip, snoring CV:    chest pain, orthopnea, PND, swelling in lower extremities, anasarca,                                                 dizziness, palpitations Resp:   shortness of breath with exertion or at rest.                productive cough,   non-productive cough, coughing up of blood.              change in color of mucus.  wheezing.   Skin:    rash or lesions. GI:  No-   heartburn, indigestion, abdominal pain, nausea, vomiting, diarrhea,                 change in bowel habits, loss of appetite GU: dysuria,  change in color of urine, no urgency or frequency.   flank pain. MS:   joint pain, stiffness, decreased range of motion, back pain. Neuro-     nothing unusual Psych:  change in mood or affect.  depression or anxiety.   memory loss.  OBJ- Physical Exam General- Alert, Oriented, Affect-appropriate, Distress- none acute, + obese Skin- rash-none, lesions- none, excoriation- none Lymphadenopathy- none Head- atraumatic            Eyes- Gross vision intact, PERRLA, conjunctivae and secretions clear            Ears- Hearing, canals-normal            Nose- Clear, no-Septal dev, mucus, polyps, erosion, perforation             Throat- Mallampati IV , mucosa clear , drainage- none, tonsils- atrophic,  Neck- flexible , trachea midline, no stridor , thyroid nl, carotid no bruit Chest - symmetrical excursion , unlabored           Heart/CV- RRR , no murmur , no gallop  , no rub, nl s1 s2                           - JVD-  none , edema- none, stasis changes- none, varices- none           Lung- clear to P&A, wheeze- none, cough- none , dullness-none, rub- none           Chest wall- + L pacemaker Abd-  Br/ Gen/ Rectal- Not done, not indicated Extrem- cyanosis- none, clubbing, none, atrophy- none, strength- nl, + cane Neuro- grossly intact to observation

## 2016-09-18 NOTE — Assessment & Plan Note (Signed)
Doing well as confirmed by download and clearly benefiting from CPAP. He would like to start a little higher pressure. Plan-try changing auto Pap range to 10-20

## 2016-09-19 ENCOUNTER — Other Ambulatory Visit: Payer: Self-pay

## 2016-09-19 DIAGNOSIS — L821 Other seborrheic keratosis: Secondary | ICD-10-CM

## 2016-09-19 DIAGNOSIS — D489 Neoplasm of uncertain behavior, unspecified: Secondary | ICD-10-CM | POA: Insufficient documentation

## 2016-09-19 DIAGNOSIS — L739 Follicular disorder, unspecified: Secondary | ICD-10-CM | POA: Diagnosis not present

## 2016-09-19 DIAGNOSIS — D485 Neoplasm of uncertain behavior of skin: Secondary | ICD-10-CM | POA: Diagnosis not present

## 2016-09-19 DIAGNOSIS — D1800 Hemangioma unspecified site: Secondary | ICD-10-CM | POA: Insufficient documentation

## 2016-09-19 DIAGNOSIS — L812 Freckles: Secondary | ICD-10-CM | POA: Insufficient documentation

## 2016-09-19 HISTORY — DX: Other seborrheic keratosis: L82.1

## 2016-09-19 MED ORDER — GLUCOSE BLOOD VI STRP: ORAL_STRIP | 3 refills | Status: DC

## 2016-09-23 DIAGNOSIS — E113313 Type 2 diabetes mellitus with moderate nonproliferative diabetic retinopathy with macular edema, bilateral: Secondary | ICD-10-CM | POA: Diagnosis not present

## 2016-09-23 DIAGNOSIS — H43813 Vitreous degeneration, bilateral: Secondary | ICD-10-CM | POA: Diagnosis not present

## 2016-09-23 DIAGNOSIS — H35373 Puckering of macula, bilateral: Secondary | ICD-10-CM | POA: Diagnosis not present

## 2016-09-23 LAB — HM DIABETES EYE EXAM

## 2016-09-26 ENCOUNTER — Emergency Department (HOSPITAL_COMMUNITY): Payer: Medicare Other

## 2016-09-26 ENCOUNTER — Encounter (HOSPITAL_COMMUNITY): Payer: Self-pay

## 2016-09-26 ENCOUNTER — Other Ambulatory Visit: Payer: Self-pay | Admitting: Endocrinology

## 2016-09-26 ENCOUNTER — Emergency Department (HOSPITAL_COMMUNITY)
Admission: EM | Admit: 2016-09-26 | Discharge: 2016-09-26 | Disposition: A | Discharge status: Home / Self Care | Payer: Medicare Other | Attending: Emergency Medicine | Admitting: Emergency Medicine

## 2016-09-26 DIAGNOSIS — N183 Chronic kidney disease, stage 3 (moderate): Secondary | ICD-10-CM | POA: Diagnosis not present

## 2016-09-26 DIAGNOSIS — E1165 Type 2 diabetes mellitus with hyperglycemia: Secondary | ICD-10-CM | POA: Diagnosis not present

## 2016-09-26 DIAGNOSIS — Z79899 Other long term (current) drug therapy: Secondary | ICD-10-CM | POA: Insufficient documentation

## 2016-09-26 DIAGNOSIS — Z87891 Personal history of nicotine dependence: Secondary | ICD-10-CM | POA: Insufficient documentation

## 2016-09-26 DIAGNOSIS — E1140 Type 2 diabetes mellitus with diabetic neuropathy, unspecified: Secondary | ICD-10-CM | POA: Diagnosis not present

## 2016-09-26 DIAGNOSIS — Z950 Presence of cardiac pacemaker: Secondary | ICD-10-CM | POA: Diagnosis not present

## 2016-09-26 DIAGNOSIS — E1151 Type 2 diabetes mellitus with diabetic peripheral angiopathy without gangrene: Secondary | ICD-10-CM | POA: Diagnosis not present

## 2016-09-26 DIAGNOSIS — I2510 Atherosclerotic heart disease of native coronary artery without angina pectoris: Secondary | ICD-10-CM | POA: Diagnosis not present

## 2016-09-26 DIAGNOSIS — R531 Weakness: Secondary | ICD-10-CM | POA: Diagnosis not present

## 2016-09-26 DIAGNOSIS — I517 Cardiomegaly: Secondary | ICD-10-CM | POA: Diagnosis not present

## 2016-09-26 DIAGNOSIS — I130 Hypertensive heart and chronic kidney disease with heart failure and stage 1 through stage 4 chronic kidney disease, or unspecified chronic kidney disease: Secondary | ICD-10-CM | POA: Insufficient documentation

## 2016-09-26 DIAGNOSIS — R404 Transient alteration of awareness: Secondary | ICD-10-CM | POA: Diagnosis not present

## 2016-09-26 DIAGNOSIS — E1122 Type 2 diabetes mellitus with diabetic chronic kidney disease: Secondary | ICD-10-CM | POA: Insufficient documentation

## 2016-09-26 DIAGNOSIS — Z7982 Long term (current) use of aspirin: Secondary | ICD-10-CM | POA: Diagnosis not present

## 2016-09-26 DIAGNOSIS — Z96659 Presence of unspecified artificial knee joint: Secondary | ICD-10-CM | POA: Diagnosis not present

## 2016-09-26 DIAGNOSIS — R410 Disorientation, unspecified: Secondary | ICD-10-CM | POA: Diagnosis not present

## 2016-09-26 DIAGNOSIS — I509 Heart failure, unspecified: Secondary | ICD-10-CM | POA: Insufficient documentation

## 2016-09-26 DIAGNOSIS — R739 Hyperglycemia, unspecified: Secondary | ICD-10-CM

## 2016-09-26 DIAGNOSIS — Z794 Long term (current) use of insulin: Secondary | ICD-10-CM | POA: Insufficient documentation

## 2016-09-26 LAB — COMPREHENSIVE METABOLIC PANEL
ALT: 19 U/L (ref 17–63)
AST: 18 U/L (ref 15–41)
Albumin: 3.8 g/dL (ref 3.5–5.0)
Alkaline Phosphatase: 84 U/L (ref 38–126)
Anion gap: 9 (ref 5–15)
BUN: 42 mg/dL — ABNORMAL HIGH (ref 6–20)
CO2: 21 mmol/L — ABNORMAL LOW (ref 22–32)
Calcium: 9.4 mg/dL (ref 8.9–10.3)
Chloride: 104 mmol/L (ref 101–111)
Creatinine, Ser: 1.92 mg/dL — ABNORMAL HIGH (ref 0.61–1.24)
GFR calc Af Amer: 37 mL/min — ABNORMAL LOW (ref >60)
GFR calc non Af Amer: 32 mL/min — ABNORMAL LOW (ref >60)
Glucose, Bld: 197 mg/dL — ABNORMAL HIGH (ref 65–99)
Potassium: 4.8 mmol/L (ref 3.5–5.1)
Sodium: 134 mmol/L — ABNORMAL LOW (ref 135–145)
Total Bilirubin: 0.7 mg/dL (ref 0.3–1.2)
Total Protein: 6.9 g/dL (ref 6.5–8.1)

## 2016-09-26 LAB — URINALYSIS, COMPLETE (UACMP) WITH MICROSCOPIC
Bilirubin Urine: NEGATIVE
Glucose, UA: 150 mg/dL — AB
Hgb urine dipstick: NEGATIVE
Ketones, ur: NEGATIVE mg/dL
Leukocytes, UA: NEGATIVE
Nitrite: NEGATIVE
Protein, ur: NEGATIVE mg/dL
RBC / HPF: NONE SEEN RBC/hpf (ref 0–5)
Specific Gravity, Urine: 1.009 (ref 1.005–1.030)
pH: 5 (ref 5.0–8.0)

## 2016-09-26 LAB — CBC WITH DIFFERENTIAL/PLATELET
Basophils Absolute: 0 10*3/uL (ref 0.0–0.1)
Basophils Relative: 0 %
Eosinophils Absolute: 0.2 10*3/uL (ref 0.0–0.7)
Eosinophils Relative: 2 %
HCT: 34.8 % — ABNORMAL LOW (ref 39.0–52.0)
Hemoglobin: 11.7 g/dL — ABNORMAL LOW (ref 13.0–17.0)
Lymphocytes Relative: 13 %
Lymphs Abs: 1.2 10*3/uL (ref 0.7–4.0)
MCH: 29.8 pg (ref 26.0–34.0)
MCHC: 33.6 g/dL (ref 30.0–36.0)
MCV: 88.8 fL (ref 78.0–100.0)
Monocytes Absolute: 0.7 10*3/uL (ref 0.1–1.0)
Monocytes Relative: 8 %
Neutro Abs: 6.8 10*3/uL (ref 1.7–7.7)
Neutrophils Relative %: 77 %
Platelets: 165 10*3/uL (ref 150–400)
RBC: 3.92 MIL/uL — ABNORMAL LOW (ref 4.22–5.81)
RDW: 14.1 % (ref 11.5–15.5)
WBC: 8.9 10*3/uL (ref 4.0–10.5)

## 2016-09-26 LAB — I-STAT VENOUS BLOOD GAS, ED
Acid-base deficit: 5 mmol/L — ABNORMAL HIGH (ref 0.0–2.0)
Bicarbonate: 21.2 mmol/L (ref 20.0–28.0)
O2 Saturation: 67 %
TCO2: 22 mmol/L (ref 0–100)
pCO2, Ven: 43.8 mmHg — ABNORMAL LOW (ref 44.0–60.0)
pH, Ven: 7.292 (ref 7.250–7.430)
pO2, Ven: 39 mmHg (ref 32.0–45.0)

## 2016-09-26 LAB — I-STAT CHEM 8, ED
BUN: 40 mg/dL — ABNORMAL HIGH (ref 6–20)
Calcium, Ion: 1.24 mmol/L (ref 1.15–1.40)
Chloride: 104 mmol/L (ref 101–111)
Creatinine, Ser: 2 mg/dL — ABNORMAL HIGH (ref 0.61–1.24)
Glucose, Bld: 200 mg/dL — ABNORMAL HIGH (ref 65–99)
HCT: 34 % — ABNORMAL LOW (ref 39.0–52.0)
Hemoglobin: 11.6 g/dL — ABNORMAL LOW (ref 13.0–17.0)
Potassium: 4.8 mmol/L (ref 3.5–5.1)
Sodium: 137 mmol/L (ref 135–145)
TCO2: 23 mmol/L (ref 0–100)

## 2016-09-26 LAB — CBG MONITORING, ED: Glucose-Capillary: 212 mg/dL — ABNORMAL HIGH (ref 65–99)

## 2016-09-26 LAB — I-STAT TROPONIN, ED: Troponin i, poc: 0.07 ng/mL (ref 0.00–0.08)

## 2016-09-26 LAB — PROTIME-INR
INR: 1.1
Prothrombin Time: 14.3 seconds (ref 11.4–15.2)

## 2016-09-26 MED ORDER — SODIUM CHLORIDE 0.9 % IV BOLUS (SEPSIS)
1000.0000 mL | Freq: Once | INTRAVENOUS | Status: AC
  Administered 2016-09-26: 1000 mL via INTRAVENOUS

## 2016-09-26 MED ORDER — SODIUM CHLORIDE 0.9 % IV SOLN: INTRAVENOUS | Status: DC

## 2016-09-26 NOTE — ED Notes (Signed)
Patient to CT.

## 2016-09-26 NOTE — ED Provider Notes (Signed)
[facility] DEPT Provider Note   CSN: [id number] Arrival date & time: 09/26/16  1701     History   Chief Complaint Chief Complaint  Patient presents with  . Weakness  . Hypotension    HPI David [name] is a 76 y.o. male.  HPI Patient presents to the emergency room for evaluation of weakness and high blood sugar. Patient states for the last few days he has had increasing blood sugars. He has history of diabetes and uses an insulin pump. His blood sugars have been running in the 300s and then up to the 500s. He has been in putting that level into his insulin pump and it looks like each time he has done that the insulin pump has given him an additional 25 units of insulin.  Patient states today his blood sugar was even higher. He gave himself 25 units of insulin subcutaneously. Shortly after that he started to feel generally weak. His family states he started to look very pale and he was diaphoretic. He felt like he was going to pass out. When EMS arrived his blood pressure was 90 systolic. He was given 500 mL of IV fluid and he is feeling a bit better at this point. He denies any trouble with headache.hey has felt a little confused recently. He denies any fevers. No trouble with chest pain or shortness of breath. No focal numbness or weakness. No vomiting or diarrhea. No rashes. Past Medical History:  Diagnosis Date  . Anemia, iron deficiency   . Anxiety   . Arthritis   . BPH (benign prostatic hypertrophy)   . CAD (coronary artery disease)    Nonobstructive CAD per cath  . Cardiac pacemaker in situ   . CHF (congestive heart failure) ([place])   . Chronic ulcer of right foot ([place])   . 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 Dyspnea   . History of cellulitis    right great toe 10-25-2014  . History of skin cancer   . HOCM (hypertrophic obstructive cardiomyopathy) ([place])   . Hypertension   . 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 Insulin pump in place   . OSA on CPAP   . Peripheral neuropathy    severe  . Peripheral vascular disease ([place])    bilateral lower extremities  . Secondary hyperparathyroidism of renal origin ([place])   . Sinus node dysfunction [facility])     Patient Active Problem List   Diagnosis Date Noted  . Chronic ulcer of right foot ([place]) 08/09/2016  . Otitis, externa, infective 03/11/2016  . Rib fracture 07/24/2015  . Syncope 07/24/2015  . 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 Osteomyelitis of foot, right, acute ([place]) 05/04/2015  . Ankle fracture, left 10/29/2014  . OSA on CPAP 10/26/2014  . Back pain 10/16/2014  . Left leg numbness 10/16/2014  . Fatigue 10/16/2014  . Impacted cerumen of both ears 10/16/2014  . Episodic lightheadedness 08/04/2014  . Open toe wound 07/18/2014  . Diabetes mellitus with neurological manifestations, uncontrolled ([place]) 02/27/2014  . Mixed hyperlipidemia 02/27/2014  . Seasonal and perennial allergic rhinitis 10/25/2013  . Essential hypertension 10/25/2013  . Pure hypercholesterolemia 10/25/2013  . Obesity 10/25/2013  . Renal insufficiency 10/25/2013    Past Surgical History:  Procedure Laterality Date  . AMPUTATION OF REPLICATED TOES  Mar 5329   right 2nd  toe (osteromylitis)  . AMPUTATION TOE Right 03/12/2015   Procedure: RIGHT HALLUS AMPUTATION ;  Surgeon: [name], MD;  Location: [facility];  Service: Podiatry;  Laterality: Right;  . CARDIAC CATHETERIZATION  11-25-2010   [place], [place]   Nonobstructive CAD  . CARDIAC PACEMAKER PLACEMENT  Nov 2009   [employer]  . EP IMPLANTABLE DEVICE N/A 06/03/2015   Procedure: PPM Generator Changeout;  Surgeon: [name], MD;  Location: [facility] CV LAB;  Service: Cardiovascular;  Laterality: N/A;  . EXCISION BONE CYST Right 03/06/2015   Procedure: BONE BIOPSIES OF RIGHT FOOT;  Surgeon: [name], MD;   Location: [facility];  Service: Podiatry;  Laterality: Right;  . ORIF ANKLE FRACTURE Left 11/06/2014   Procedure: OPEN REDUCTION INTERNAL FIXATION (ORIF) LEFT  ANKLE FRACTURE;  Surgeon: [name], MD;  Location: [facility];  Service: Orthopedics;  Laterality: Left;  . TOTAL KNEE ARTHROPLASTY    . VEIN LIGATION AND STRIPPING         Home Medications    Prior to Admission medications   Medication Sig Start Date End Date Taking? Authorizing Provider  ACCU-CHEK SOFTCLIX LANCETS lancets USE AS INSTRUCTED TO CHECK BLOOD SUGAR 6 TIMES PER DAY Dx: Code E11.9 12/25/15   Elayne Snare, MD  aspirin EC 325 MG tablet Take 1 tablet (325 mg total) by mouth daily. Patient taking differently: Take 81 mg by mouth daily.  11/06/14   Corky Sing, PA-C  Blood Glucose Monitoring Suppl (ACCU-CHEK AVIVA) device USE AS INSTRUCTED TO CHECK BLOOD SUGAR 6 TIMES PER DAY Dx: Code E11.9 12/14/15 12/13/16  Elayne Snare, MD  calcitRIOL (ROCALTROL) 0.25 MCG capsule Take 0.25 mcg by mouth daily.  05/30/14   [provider]  carvedilol (COREG) 25 MG tablet Take 1 tablet (25 mg total) by mouth 2 (two) times daily with a meal. 03/24/15   [name], FNP  doxazosin (CARDURA) 4 MG tablet TAKE ONE TABLET BY MOUTH ONCE DAILY 08/22/16   Elayne Snare, MD  glucose blood (BAYER CONTOUR NEXT TEST) test strip Use to test blood sugar 3 times daily    Dx code is E11.49 03/23/16   Elayne Snare, MD  glucose blood (BAYER CONTOUR NEXT TEST) test strip USE STRIP TO CHECK GLUCOSE THREE TIMES DAILY Dx code E11.9 09/19/16   Elayne Snare, MD  hydrocortisone cream 1 % Apply to affected area 2 times daily 07/05/15   Leo Grosser, MD  insulin aspart (NOVOLOG) 100 UNIT/ML injection USE MAX 120 UNITS PER DAY WITH CONTINUOUS INSULIN PUMP DX CODE E11.65 08/24/16   Elayne Snare, MD  isosorbide mononitrate (IMDUR) 30 MG 24 hr tablet TAKE TWO TABLETS BY MOUTH ONCE DAILY 07/06/15   [name], MD  liraglutide (VICTOZA)  18 MG/3ML SOPN INJECT 0.3MLS (1.8 MG TOTAL) INTO THE SKIN DAILY 05/23/16   Elayne Snare, MD  lisinopril (PRINIVIL,ZESTRIL) 5 MG tablet Take 5 mg by mouth daily. 07/15/15   [provider]  mupirocin cream (BACTROBAN) 2 % Apply 1 application topically daily. Toe ulcer 08/23/16   Stover, Lady Saucier, DPM  NOVOLOG 100 UNIT/ML injection USE MAX 120 UNITS PER DAY WITH CONTINUOUS INSULIN PUMP 09/26/16   Elayne Snare, MD  Omega-3 Fatty Acids (FISH OIL) 1000 MG CAPS Take by mouth 2 (two) times daily.    [provider]  pantoprazole (PROTONIX) 40 MG tablet TAKE ONE TABLET BY MOUTH ONCE DAILY 03/15/16   [name], FNP  sertraline (ZOLOFT) 50 MG tablet  TAKE ONE TABLET BY MOUTH ONCE DAILY 09/09/16   [name], FNP  torsemide (DEMADEX) 20 MG tablet TAKE TWO TABLETS BY MOUTH  DAILY 11/06/15   [name], FNP    Family History Family History  Problem Relation Age of Onset  . Cancer Mother        breast  . Heart attack Father     Social History Social History  Substance Use Topics  . Smoking status: Former Smoker    Packs/day: 2.00    Years: 30.00    Quit date: 03/03/1984  . Smokeless tobacco: Former Systems developer  . Alcohol use 0.6 oz/week    1 Glasses of wine per week     Comment: social     Allergies   Patient has no known allergies.   Review of Systems Review of Systems  All other systems reviewed and are negative.    Physical Exam Updated Vital Signs BP (!) 142/55   Pulse 60   Resp 18   SpO2 99%   Physical Exam  Constitutional: No distress.  Obese  HENT:  Head: Normocephalic and atraumatic.  Right Ear: External ear normal.  Left Ear: External ear normal.  Eyes: Conjunctivae are normal. Right eye exhibits no discharge. Left eye exhibits no discharge. No scleral icterus.  Neck: Neck supple. No tracheal deviation present.  Cardiovascular: Normal rate, regular rhythm and intact distal pulses.   Pulmonary/Chest: Effort normal and breath sounds normal. No  stridor. No respiratory distress. He has no wheezes. He has no rales.  Abdominal: Soft. Bowel sounds are normal. He exhibits no distension. There is no tenderness. There is no rebound and no guarding.  Musculoskeletal: He exhibits no edema or tenderness.  Neurological: He is alert. He has normal strength. No cranial nerve deficit (no facial droop, extraocular movements intact, no slurred speech) or sensory deficit. He exhibits normal muscle tone. He displays no seizure activity. Coordination normal.  Skin: Skin is warm and dry. No rash noted. He is not diaphoretic.  Psychiatric: He has a normal mood and affect.  Nursing note and vitals reviewed.    ED Treatments / Results  Labs (all labs ordered are listed, but only abnormal results are displayed) Labs Reviewed  CBC WITH DIFFERENTIAL/PLATELET - Abnormal; Notable for the following:       Result Value   RBC 3.92 (*)    Hemoglobin 11.7 (*)    HCT 34.8 (*)    All other components within normal limits  COMPREHENSIVE METABOLIC PANEL - Abnormal; Notable for the following:    Sodium 134 (*)    CO2 21 (*)    Glucose, Bld 197 (*)    BUN 42 (*)    Creatinine, Ser 1.92 (*)    GFR calc non Af Amer 32 (*)    GFR calc Af Amer 37 (*)    All other components within normal limits  URINALYSIS, COMPLETE (UACMP) WITH MICROSCOPIC - Abnormal; Notable for the following:    Glucose, UA 150 (*)    Bacteria, UA RARE (*)    Squamous Epithelial / LPF 0-5 (*)    All other components within normal limits  CBG MONITORING, ED - Abnormal; Notable for the following:    Glucose-Capillary 212 (*)    All other components within normal limits  I-STAT CHEM 8, ED - Abnormal; Notable for the following:    BUN 40 (*)    Creatinine, Ser 2.00 (*)    Glucose, Bld 200 (*)    Hemoglobin  11.6 (*)    HCT 34.0 (*)    All other components within normal limits  I-STAT VENOUS BLOOD GAS, ED - Abnormal; Notable for the following:    pCO2, Ven 43.8 (*)    Acid-base deficit 5.0  (*)    All other components within normal limits  Alphonzo Lemmings, ED    EKG  EKG Interpretation  Date/Time:  Monday Sep 26 2016 17:13:47 EDT Ventricular Rate:  152 PR Interval:    QRS Duration: 36 QT Interval:  251 QTC Calculation: 295 R Axis:   139 Text Interpretation:  Confirmed by Marra Fraga  MD-J, Brenn Deziel [id number]) on 09/26/2016 5:16:54 PM       Radiology Dg Chest 2 View  Result Date: 09/26/2016 CLINICAL DATA:  Weakness and confusion EXAM: CHEST  2 VIEW COMPARISON:  07/23/2015, 11/28/2014 FINDINGS: Left-sided pacing device as before. Cardiomegaly without edema, pleural effusion or infiltrate. Aortic atherosclerosis. No pneumothorax. IMPRESSION: 1. Cardiomegaly.  No infiltrate or edema. Electronically Signed   By: Donavan Foil [name]   On: 09/26/2016 18:25   Ct Head Wo Contrast  Result Date: 09/26/2016 CLINICAL DATA:  Ct head wo, weakness, confusion EXAM: CT HEAD WITHOUT CONTRAST TECHNIQUE: Contiguous axial images were obtained from the base of the skull through the vertex without intravenous contrast. COMPARISON:  None. FINDINGS: Brain: There is central and cortical atrophy. Periventricular white matter changes are consistent with small vessel disease. There is no intra or extra-axial fluid collection or mass lesion. The basilar cisterns and ventricles have a normal appearance. There is no CT evidence for acute infarction or hemorrhage. Vascular: No hyperdense vessel or unexpected calcification. Skull: Normal. Negative for fracture or focal lesion. Sinuses/Orbits: No acute finding. Other: None. IMPRESSION: No evidence for acute intracranial abnormality. Atrophy and small vessel disease. Electronically Signed   By: Nolon Nations [name]   On: 09/26/2016 19:16    Procedures Procedures (including critical care time)  Medications Ordered in ED Medications  sodium chloride 0.9 % bolus 1,000 mL (0 mLs Intravenous Stopped 09/26/16 2011)    And  0.9 %  sodium chloride infusion (not  administered)     Initial Impression / Assessment and Plan / ED Course  I have reviewed the triage vital signs and the nursing notes.  Pertinent labs & imaging results that were available during my care of the patient were reviewed by me and considered in my medical decision making (see chart for details).  Clinical Course as of Sep 27 2146  Mon Sep 26, 2016  9371 [employer]:  Everything within expected range, no events, no arrhythmias.  [JK]    Clinical Course User Index [JK] Dorie Rank, MD    Pt presented to the ED for evaluation of hyperglycemia.  He had an episode of hypotension and possibly a vasovagal episode at home.  Cardiac workup and vitals are stable in the ED. Blood pressure improved with IV fluids.  I suspect his recent hyperglycemia attributed to dehydration and his hypotension.  Renal function is unchanged from baseline.  Hgb is stable (prior was 12)  Pt's blood sugar improved when he gave himself an injection of insulin.  I suspect the tubing on his pump was not working properly because the device was saying he was getting 25 unit boluses.  Discussed having him change his tubing at home.  Consult with his endocrinologist tomorrow to arrange for close follow up. Final Clinical Impressions(s) / ED Diagnoses   Final diagnoses:  Hyperglycemia    New Prescriptions New  Prescriptions   No medications on file     Dorie Rank, MD 09/26/16 2148

## 2016-09-26 NOTE — ED Notes (Signed)
Patient to xray.

## 2016-09-26 NOTE — ED Notes (Signed)
Pt's CBG 212.  Informed Dr. Tomi Bamberger and Estill Batten, RN.

## 2016-09-26 NOTE — ED Notes (Signed)
EKG given to Dr. Knapp. 

## 2016-09-26 NOTE — ED Triage Notes (Signed)
[facility]- pt coming from home with complaint of hyerglycemia. Pt found to be weak, and lethargic with EMS. He was also diaphoretic. Pt had approximately 500cc of fluid. Pt alert on arrival. Initial BP 90 systolic and improved to 871. CBG was 267 with EMS.

## 2016-09-26 NOTE — Discharge Instructions (Signed)
Change your insulin tubing as we discussed.  Follow up with your endocrinologist.  Call tomorrow to arrange follow up.  Return as needed for worsening symptoms

## 2016-09-27 ENCOUNTER — Encounter: Payer: Medicare Other | Attending: Endocrinology | Admitting: Nutrition

## 2016-09-27 DIAGNOSIS — Z794 Long term (current) use of insulin: Secondary | ICD-10-CM | POA: Insufficient documentation

## 2016-09-27 DIAGNOSIS — E1165 Type 2 diabetes mellitus with hyperglycemia: Secondary | ICD-10-CM | POA: Insufficient documentation

## 2016-09-27 DIAGNOSIS — IMO0002 Reserved for concepts with insufficient information to code with codable children: Secondary | ICD-10-CM

## 2016-09-27 DIAGNOSIS — Z713 Dietary counseling and surveillance: Secondary | ICD-10-CM | POA: Insufficient documentation

## 2016-09-27 DIAGNOSIS — E1149 Type 2 diabetes mellitus with other diabetic neurological complication: Secondary | ICD-10-CM

## 2016-09-27 NOTE — Patient Instructions (Signed)
Review high blood sugar protocol as needed Call [employer] to exchange/order the sure t infusion sets--32 inch tubing,and 60mm needle length.

## 2016-09-27 NOTE — Progress Notes (Signed)
Pt. Is here with his wife, and reported that he spent the night in the ER with "high blood sugars".  He said he had had 3 days of blood sugars in the 300s-400s, despite  Many correction boluses.   He changed his infusion set out once, and then tried to get it down with giving additional boluses.  Late Sunday evening, after several tries with additional boluses, he gave himself an injection of 25u at 8PM.  Wife called the ambulance at 10PM, (2 hours after the injection),  and blood sugar was over 500.  He was dehydrated, but no nausea, or vomiting.   His abdominal sites that he is using are raised areas that are very hard.  He was shown different sites to use in the upper abdomen, and well as upper buttocks, and lower buttocks areas, and his upper side areas.   We reviewed high blood sugar protocol, and he was given another handout.  He was shown the SureT infusion set as well.  He liked this and wants to change to this.  He was told to call [employer], and have them send/exchange for the quick sets he is using now.  We reviewed the steps on how to connect, prime and insert them, as well as the need to NOT give a "fill canula amount of 0.5u bolus".  He reported good understanding of this and had no final questions.

## 2016-09-29 ENCOUNTER — Other Ambulatory Visit: Payer: Self-pay

## 2016-09-29 MED ORDER — INSULIN ASPART 100 UNIT/ML ~~LOC~~ SOLN: SUBCUTANEOUS | 3 refills | Status: DC

## 2016-09-30 ENCOUNTER — Other Ambulatory Visit: Payer: Self-pay | Admitting: Endocrinology

## 2016-10-02 ENCOUNTER — Other Ambulatory Visit: Payer: Self-pay | Admitting: Endocrinology

## 2016-10-02 DIAGNOSIS — E1165 Type 2 diabetes mellitus with hyperglycemia: Secondary | ICD-10-CM

## 2016-10-02 DIAGNOSIS — Z794 Long term (current) use of insulin: Secondary | ICD-10-CM

## 2016-10-04 ENCOUNTER — Ambulatory Visit (INDEPENDENT_AMBULATORY_CARE_PROVIDER_SITE_OTHER): Payer: Medicare Other | Admitting: Sports Medicine

## 2016-10-04 ENCOUNTER — Encounter: Payer: Self-pay | Admitting: Sports Medicine

## 2016-10-04 DIAGNOSIS — E0842 Diabetes mellitus due to underlying condition with diabetic polyneuropathy: Secondary | ICD-10-CM | POA: Diagnosis not present

## 2016-10-04 DIAGNOSIS — L97521 Non-pressure chronic ulcer of other part of left foot limited to breakdown of skin: Secondary | ICD-10-CM

## 2016-10-04 DIAGNOSIS — M79675 Pain in left toe(s): Secondary | ICD-10-CM | POA: Diagnosis not present

## 2016-10-04 DIAGNOSIS — B351 Tinea unguium: Secondary | ICD-10-CM | POA: Diagnosis not present

## 2016-10-04 DIAGNOSIS — M79674 Pain in right toe(s): Secondary | ICD-10-CM

## 2016-10-04 NOTE — Progress Notes (Signed)
Subjective: David [name] is a 76 y.o. male patient seen in office for follow up evaluation of ulceration of the left 3rd toe and for nail trim. Patient has a history of diabetes and a blood glucose that is "good". Patient is changing the dressing using Bactroban at home with help from wife; Report 2nd toe blister healed. Denies nausea/fever/vomiting/chills/night sweats/shortness of breath/pain. Patient has no other pedal complaints at this time.  Patient Active Problem List   Diagnosis Date Noted  . Chronic ulcer of right foot ([place]) 08/09/2016  . Otitis, externa, infective 03/11/2016  . Rib fracture 07/24/2015  . Syncope 07/24/2015  . 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 Osteomyelitis of foot, right, acute ([place]) 05/04/2015  . Ankle fracture, left 10/29/2014  . OSA on CPAP 10/26/2014  . Back pain 10/16/2014  . Left leg numbness 10/16/2014  . Fatigue 10/16/2014  . Impacted cerumen of both ears 10/16/2014  . Episodic lightheadedness 08/04/2014  . Open toe wound 07/18/2014  . Diabetes mellitus with neurological manifestations, uncontrolled ([place]) 02/27/2014  . Mixed hyperlipidemia 02/27/2014  . Seasonal and perennial allergic rhinitis 10/25/2013  . Essential hypertension 10/25/2013  . Pure hypercholesterolemia 10/25/2013  . Obesity 10/25/2013  . Renal insufficiency 10/25/2013   Current Outpatient Prescriptions on File Prior to Visit  Medication Sig Dispense Refill  . ACCU-CHEK SOFTCLIX LANCETS lancets USE AS INSTRUCTED TO CHECK BLOOD SUGAR 6 TIMES PER DAY Dx: Code E11.9 300 each 2  . aspirin EC 325 MG tablet Take 1 tablet (325 mg total) by mouth daily. (Patient taking differently: Take 81 mg by mouth daily. ) 42 tablet 0  . Blood Glucose Monitoring Suppl (ACCU-CHEK AVIVA) device USE AS INSTRUCTED TO CHECK BLOOD SUGAR 6 TIMES PER DAY Dx: Code E11.9 1 each 0  . calcitRIOL (ROCALTROL) 0.25 MCG capsule Take 0.25 mcg by mouth daily.     . carvedilol (COREG) 25 MG  tablet Take 1 tablet (25 mg total) by mouth 2 (two) times daily with a meal. 90 tablet 1  . doxazosin (CARDURA) 4 MG tablet TAKE ONE TABLET BY MOUTH ONCE DAILY 90 tablet 0  . glucose blood (BAYER CONTOUR NEXT TEST) test strip Use to test blood sugar 3 times daily    Dx code is E11.49 100 each 3  . glucose blood (BAYER CONTOUR NEXT TEST) test strip USE STRIP TO CHECK GLUCOSE THREE TIMES DAILY Dx code E11.9 100 each 3  . hydrocortisone cream 1 % Apply to affected area 2 times daily 15 g 0  . insulin aspart (NOVOLOG) 100 UNIT/ML injection USE MAX 120 UNITS PER DAY WITH CONTINUOUS INSULIN PUMP DX CODE E11.65 60 mL 3  . isosorbide mononitrate (IMDUR) 30 MG 24 hr tablet TAKE TWO TABLETS BY MOUTH ONCE DAILY 60 tablet 11  . liraglutide (VICTOZA) 18 MG/3ML SOPN INJECT 0.3MLS (1.8 MG TOTAL) INTO THE SKIN DAILY 9 pen 1  . lisinopril (PRINIVIL,ZESTRIL) 5 MG tablet Take 5 mg by mouth daily.    . mupirocin cream (BACTROBAN) 2 % Apply 1 application topically daily. Toe ulcer 15 g 0  . NOVOLOG 100 UNIT/ML injection USE MAX 120 UNITS PER DAY WITH CONTINUOUS INSULIN PUMP 60 mL 3  . NOVOLOG 100 UNIT/ML injection USE MAX 120 UNITS PER DAY WITH CONTINUOUS INSULIN PUMP 60 mL 3  . Omega-3 Fatty Acids (FISH OIL) 1000 MG CAPS Take by mouth 2 (two) times daily.    . pantoprazole (PROTONIX) 40 MG tablet TAKE ONE TABLET BY MOUTH ONCE DAILY  90 tablet 1  . sertraline (ZOLOFT) 50 MG tablet TAKE ONE TABLET BY MOUTH ONCE DAILY 90 tablet 2  . torsemide (DEMADEX) 20 MG tablet TAKE TWO TABLETS BY MOUTH  DAILY 180 tablet 1   No current facility-administered medications on file prior to visit.    No Known Allergies  Recent Results (from the past 2160 hour(s))  Basic metabolic panel     Status: Abnormal   Collection Time: 08/05/16 10:16 AM  Result Value Ref Range   Sodium 136 135 - 145 mEq/L   Potassium 5.4 (H) 3.5 - 5.1 mEq/L   Chloride 105 96 - 112 mEq/L   CO2 25 19 - 32 mEq/L   Glucose, Bld 263 (H) 70 - 99 mg/dL   BUN 47  (H) 6 - 23 mg/dL   Creatinine, Ser 2.45 (H) 0.40 - 1.50 mg/dL   Calcium 9.3 8.4 - 10.5 mg/dL   GFR 27.46 (L) >60.00 mL/min  Fructosamine     Status: Abnormal   Collection Time: 08/05/16 10:16 AM  Result Value Ref Range   Fructosamine 328 (H) 0 - 285 umol/L    Comment: Published reference interval for apparently healthy subjects between age 16 and 26 is 77 - 285 umol/L and in a poorly controlled diabetic population is 228 - 563 umol/L with a mean of 396 umol/L.   Lipid panel     Status: Abnormal   Collection Time: 08/05/16 10:16 AM  Result Value Ref Range   Cholesterol 152 0 - 200 mg/dL    Comment: ATP III Classification       Desirable:  < 200 mg/dL               Borderline High:  200 - 239 mg/dL          High:  > = 240 mg/dL   Triglycerides 243.0 (H) 0.0 - 149.0 mg/dL    Comment: Normal:  <150 mg/dLBorderline High:  150 - 199 mg/dL   HDL 19.90 (L) >39.00 mg/dL   VLDL 48.6 (H) 0.0 - 40.0 mg/dL   Total CHOL/HDL Ratio 8     Comment:                Men          Women1/2 Average Risk     3.4          3.3Average Risk          5.0          4.42X Average Risk          9.6          7.13X Average Risk          15.0          11.0                       NonHDL 132.27     Comment: NOTE:  Non-HDL goal should be 30 mg/dL higher than patient's LDL goal (i.e. LDL goal of < 70 mg/dL, would have non-HDL goal of < 100 mg/dL)  LDL cholesterol, direct     Status: None   Collection Time: 08/05/16 10:16 AM  Result Value Ref Range   Direct LDL 77.0 mg/dL    Comment: Optimal:  <100 mg/dLNear or Above Optimal:  100-129 mg/dLBorderline High:  130-159 mg/dLHigh:  160-189 mg/dLVery High:  >190 mg/dL  CUP PACEART REMOTE DEVICE CHECK     Status: None   Collection Time: 08/23/16  2:40 PM  Result Value Ref Range   Date Time Interrogation Session [id number]    Pulse Generator Manufacturer MERM    Pulse Gen Model [id number] Advisa DR MRI    Pulse Gen Serial Number [id number]    Clinic Name [facility]     Implantable Pulse Generator Type Implantable Pulse Generator    Implantable Pulse Generator Implant Date 27517001    Implantable Lead Manufacturer [facility]    Implantable Lead Model 5076 CapSureFix Novus    Implantable Lead Serial Number [id number]    Implantable Lead Implant Date 16384665    Implantable Lead Location Detail 1 UNKNOWN    Implantable Lead Location [id number]    Implantable Lead Manufacturer [facility]    Implantable Lead Model 5076 CapSureFix Novus    Implantable Lead Serial Number [id number]    Implantable Lead Implant Date [id number]    Implantable Lead Location Detail 1 UNKNOWN    Implantable Lead Location [id number]    Lead Channel Setting Sensing Sensitivity 2.8 mV   Lead Channel Setting Pacing Amplitude 2.5 V   Lead Channel Setting Pacing Pulse Width 0.4 ms   Lead Channel Setting Pacing Amplitude 2.5 V   Lead Channel Impedance Value 513 ohm   Lead Channel Impedance Value 380 ohm   Lead Channel Sensing Intrinsic Amplitude 1.375 mV   Lead Channel Sensing Intrinsic Amplitude 1.375 mV   Lead Channel Pacing Threshold Amplitude 2.25 V   Lead Channel Pacing Threshold Pulse Width 0.4 ms   Lead Channel Impedance Value 513 ohm   Lead Channel Impedance Value 399 ohm   Lead Channel Sensing Intrinsic Amplitude 21.625 mV   Lead Channel Sensing Intrinsic Amplitude 21.625 mV   Lead Channel Pacing Threshold Amplitude 1 V   Lead Channel Pacing Threshold Pulse Width 0.4 ms   Battery Status OK    Battery Remaining Longevity 85 mo   Battery Voltage 3.00 V   Brady Statistic RA Percent Paced 99.59 %   Brady Statistic RV Percent Paced 0.05 %   Brady Statistic AP VP Percent 0.05 %   Brady Statistic AS VP Percent 0 %   Brady Statistic AP VS Percent 99.55 %   Brady Statistic AS VS Percent 0.40 %   Eval Rhythm Ap/Vs   CBG monitoring, ED     Status: Abnormal   Collection Time: 09/26/16  5:18 PM  Result Value Ref Range   Glucose-Capillary 212 (H) 65 - 99 mg/dL   Comment 1 Notify RN    Comment 2  Document in Chart   CBC WITH DIFFERENTIAL     Status: Abnormal   Collection Time: 09/26/16  6:31 PM  Result Value Ref Range   WBC 8.9 4.0 - 10.5 K/uL   RBC 3.92 (L) 4.22 - 5.81 MIL/uL   Hemoglobin 11.7 (L) 13.0 - 17.0 g/dL   HCT 34.8 (L) 39.0 - 52.0 %   MCV 88.8 78.0 - 100.0 fL   MCH 29.8 26.0 - 34.0 pg   MCHC 33.6 30.0 - 36.0 g/dL   RDW 14.1 11.5 - 15.5 %   Platelets 165 150 - 400 K/uL   Neutrophils Relative % 77 %   Neutro Abs 6.8 1.7 - 7.7 K/uL   Lymphocytes Relative 13 %   Lymphs Abs 1.2 0.7 - 4.0 K/uL   Monocytes Relative 8 %   Monocytes Absolute 0.7 0.1 - 1.0 K/uL   Eosinophils Relative 2 %   Eosinophils Absolute 0.2 0.0 - 0.7 K/uL   Basophils Relative 0 %  Basophils Absolute 0.0 0.0 - 0.1 K/uL  Comprehensive metabolic panel     Status: Abnormal   Collection Time: 09/26/16  6:31 PM  Result Value Ref Range   Sodium 134 (L) 135 - 145 mmol/L   Potassium 4.8 3.5 - 5.1 mmol/L   Chloride 104 101 - 111 mmol/L   CO2 21 (L) 22 - 32 mmol/L   Glucose, Bld 197 (H) 65 - 99 mg/dL   BUN 42 (H) 6 - 20 mg/dL   Creatinine, Ser 1.92 (H) 0.61 - 1.24 mg/dL   Calcium 9.4 8.9 - 10.3 mg/dL   Total Protein 6.9 6.5 - 8.1 g/dL   Albumin 3.8 3.5 - 5.0 g/dL   AST 18 15 - 41 U/L   ALT 19 17 - 63 U/L   Alkaline Phosphatase 84 38 - 126 U/L   Total Bilirubin 0.7 0.3 - 1.2 mg/dL   GFR calc non Af Amer 32 (L) >60 mL/min   GFR calc Af Amer 37 (L) >60 mL/min    Comment: (NOTE) The eGFR has been calculated using the CKD EPI equation. This calculation has not been validated in all clinical situations. eGFR's persistently <60 mL/min signify possible Chronic Kidney Disease.    Anion gap 9 5 - 15  Protime-INR     Status: None   Collection Time: 09/26/16  6:31 PM  Result Value Ref Range   Prothrombin Time 14.3 11.4 - 15.2 seconds   INR 1.10   I-stat troponin, ED     Status: None   Collection Time: 09/26/16  6:59 PM  Result Value Ref Range   Troponin i, poc 0.07 0.00 - 0.08 ng/mL   Comment 3             Comment: Due to the release kinetics of cTnI, a negative result within the first hours of the onset of symptoms does not rule out myocardial infarction with certainty. If myocardial infarction is still suspected, repeat the test at appropriate intervals.   I-Stat venous blood gas, ED     Status: Abnormal   Collection Time: 09/26/16  7:00 PM  Result Value Ref Range   pH, Ven 7.292 7.250 - 7.430   pCO2, Ven 43.8 (L) 44.0 - 60.0 mmHg   pO2, Ven 39.0 32.0 - 45.0 mmHg   Bicarbonate 21.2 20.0 - 28.0 mmol/L   TCO2 22 0 - 100 mmol/L   O2 Saturation 67.0 %   Acid-base deficit 5.0 (H) 0.0 - 2.0 mmol/L   Patient temperature HIDE    Sample type VENOUS   I-stat chem 8, ED     Status: Abnormal   Collection Time: 09/26/16  7:02 PM  Result Value Ref Range   Sodium 137 135 - 145 mmol/L   Potassium 4.8 3.5 - 5.1 mmol/L   Chloride 104 101 - 111 mmol/L   BUN 40 (H) 6 - 20 mg/dL   Creatinine, Ser 2.00 (H) 0.61 - 1.24 mg/dL   Glucose, Bld 200 (H) 65 - 99 mg/dL   Calcium, Ion 1.24 1.15 - 1.40 mmol/L   TCO2 23 0 - 100 mmol/L   Hemoglobin 11.6 (L) 13.0 - 17.0 g/dL   HCT 34.0 (L) 39.0 - 52.0 %  Urinalysis, Complete w Microscopic     Status: Abnormal   Collection Time: 09/26/16  9:05 PM  Result Value Ref Range   Color, Urine YELLOW YELLOW   APPearance CLEAR CLEAR   Specific Gravity, Urine 1.009 1.005 - 1.030   pH 5.0 5.0 - 8.0  Glucose, UA 150 (A) NEGATIVE mg/dL   Hgb urine dipstick NEGATIVE NEGATIVE   Bilirubin Urine NEGATIVE NEGATIVE   Ketones, ur NEGATIVE NEGATIVE mg/dL   Protein, ur NEGATIVE NEGATIVE mg/dL   Nitrite NEGATIVE NEGATIVE   Leukocytes, UA NEGATIVE NEGATIVE   RBC / HPF NONE SEEN 0 - 5 RBC/hpf   WBC, UA 0-5 0 - 5 WBC/hpf   Bacteria, UA RARE (A) NONE SEEN   Squamous Epithelial / LPF 0-5 (A) NONE SEEN   Mucous PRESENT    Hyaline Casts, UA PRESENT     Objective: There were no vitals filed for this visit.  General: Patient is awake, alert, oriented x 3 and in no acute  distress.  Dermatology: Skin is warm and dry bilateral with a partial thickness ulceration present left 3rd toe distal tuft. Ulceration measures 0.3cm x 0.5 cm x 0.1cm. There is a keratotic border with a granular base. The ulceration does not probe to bone. There is no malodor, no active drainage, no erythema, no edema. No acute signs of infection. Resolved blister at left 2nd toe. Nails are thick and mycotic.    Vascular: Dorsalis Pedis pulse = 1/4 Bilateral,  Posterior Tibial pulse = 1/4 Bilateral,  Capillary Fill Time < 5 seconds  Neurologic: Protective sensation absent to the level of knee using  the 5.07/10g [employer].  Musculosketal:  No Pain with palpation to ulcerated area. No pain with compression to calves bilateral. Hammertoe and history of toe amps on right.  Assessment and Plan:  Problem List Items Addressed This Visit    None    Visit Diagnoses    Toe ulcer, left, limited to breakdown of skin ([place])    -  Primary   Diabetic polyneuropathy associated with diabetes mellitus due to underlying condition ([place])       Pain due to onychomycosis of toenails of both feet          -Complete examination performed -Debrided nails bilateral using sterile nail nipper without incident  -Discussed the progression of the wound and treatment alternatives. - Excisionally dedbrided ulceration at left 3rd toe to healthy bleeding borders using a sterile chisel  blade. -Applied antibiotic cream and bandaid dressing and instructed patient to continue with daily dressings at home consisting of bactroban and bandaid/dry sterile dressing. Advised patient remain from letting beach water get on ulcer; may use liquid band-aid to protect the area  - Advised patient to go to the ER or return to office if the wound worsens or if constitutional symptoms are present. -Patient to return to office in 3 weeks for follow up care and evaluation or sooner if problems arise.  Landis Martins,  DPM

## 2016-10-06 ENCOUNTER — Encounter: Payer: Self-pay | Admitting: Internal Medicine

## 2016-10-06 ENCOUNTER — Encounter: Payer: Medicare Other | Admitting: Genetic Counselor

## 2016-10-07 ENCOUNTER — Other Ambulatory Visit: Payer: Medicare Other

## 2016-10-07 ENCOUNTER — Other Ambulatory Visit (INDEPENDENT_AMBULATORY_CARE_PROVIDER_SITE_OTHER): Payer: Medicare Other

## 2016-10-07 DIAGNOSIS — E1165 Type 2 diabetes mellitus with hyperglycemia: Secondary | ICD-10-CM | POA: Diagnosis not present

## 2016-10-07 DIAGNOSIS — Z794 Long term (current) use of insulin: Secondary | ICD-10-CM

## 2016-10-07 LAB — HEMOGLOBIN A1C: Hgb A1c MFr Bld: 9.1 % — ABNORMAL HIGH (ref 4.6–6.5)

## 2016-10-07 LAB — MICROALBUMIN / CREATININE URINE RATIO
Creatinine,U: 69.6 mg/dL
Microalb Creat Ratio: 11.1 mg/g (ref 0.0–30.0)
Microalb, Ur: 7.7 mg/dL — ABNORMAL HIGH (ref 0.0–1.9)

## 2016-10-07 LAB — BASIC METABOLIC PANEL
BUN: 40 mg/dL — ABNORMAL HIGH (ref 6–23)
CO2: 22 mEq/L (ref 19–32)
Calcium: 9.2 mg/dL (ref 8.4–10.5)
Chloride: 110 mEq/L (ref 96–112)
Creatinine, Ser: 1.9 mg/dL — ABNORMAL HIGH (ref 0.40–1.50)
GFR: 36.8 mL/min — ABNORMAL LOW (ref >60.00)
Glucose, Bld: 175 mg/dL — ABNORMAL HIGH (ref 70–99)
Potassium: 4.9 mEq/L (ref 3.5–5.1)
Sodium: 138 mEq/L (ref 135–145)

## 2016-10-11 ENCOUNTER — Telehealth: Payer: Self-pay | Admitting: Endocrinology

## 2016-10-11 NOTE — Telephone Encounter (Signed)
I did not release any information

## 2016-10-11 NOTE — Telephone Encounter (Signed)
Luna Fuse called to get clinical notes and appointment status from office. I ddi release any information and advised that clinical staff would contact to ensure security.

## 2016-10-13 ENCOUNTER — Ambulatory Visit: Payer: Medicare Other | Admitting: Endocrinology

## 2016-10-14 ENCOUNTER — Encounter: Payer: Self-pay | Admitting: Family

## 2016-10-14 ENCOUNTER — Encounter: Payer: Self-pay | Admitting: Endocrinology

## 2016-10-18 ENCOUNTER — Ambulatory Visit (INDEPENDENT_AMBULATORY_CARE_PROVIDER_SITE_OTHER): Payer: Medicare Other | Admitting: Sports Medicine

## 2016-10-18 ENCOUNTER — Encounter: Payer: Self-pay | Admitting: Family

## 2016-10-18 DIAGNOSIS — L97521 Non-pressure chronic ulcer of other part of left foot limited to breakdown of skin: Secondary | ICD-10-CM | POA: Diagnosis not present

## 2016-10-18 DIAGNOSIS — Z6834 Body mass index (BMI) 34.0-34.9, adult: Secondary | ICD-10-CM | POA: Diagnosis not present

## 2016-10-18 DIAGNOSIS — N2581 Secondary hyperparathyroidism of renal origin: Secondary | ICD-10-CM | POA: Diagnosis not present

## 2016-10-18 DIAGNOSIS — N183 Chronic kidney disease, stage 3 (moderate): Secondary | ICD-10-CM | POA: Diagnosis not present

## 2016-10-18 DIAGNOSIS — R3911 Hesitancy of micturition: Secondary | ICD-10-CM | POA: Diagnosis not present

## 2016-10-18 DIAGNOSIS — M2042 Other hammer toe(s) (acquired), left foot: Secondary | ICD-10-CM

## 2016-10-18 DIAGNOSIS — E0842 Diabetes mellitus due to underlying condition with diabetic polyneuropathy: Secondary | ICD-10-CM

## 2016-10-18 DIAGNOSIS — I129 Hypertensive chronic kidney disease with stage 1 through stage 4 chronic kidney disease, or unspecified chronic kidney disease: Secondary | ICD-10-CM | POA: Diagnosis not present

## 2016-10-18 DIAGNOSIS — N401 Enlarged prostate with lower urinary tract symptoms: Secondary | ICD-10-CM | POA: Diagnosis not present

## 2016-10-21 DIAGNOSIS — E119 Type 2 diabetes mellitus without complications: Secondary | ICD-10-CM | POA: Diagnosis not present

## 2016-10-21 LAB — HM DIABETES EYE EXAM

## 2016-10-22 ENCOUNTER — Other Ambulatory Visit: Payer: Self-pay | Admitting: Endocrinology

## 2016-10-24 ENCOUNTER — Other Ambulatory Visit: Payer: Self-pay

## 2016-10-24 MED ORDER — INSULIN ASPART 100 UNIT/ML ~~LOC~~ SOLN: SUBCUTANEOUS | 1 refills | Status: DC

## 2016-10-25 ENCOUNTER — Encounter: Payer: Self-pay | Admitting: Internal Medicine

## 2016-10-25 ENCOUNTER — Ambulatory Visit (INDEPENDENT_AMBULATORY_CARE_PROVIDER_SITE_OTHER): Payer: Medicare Other | Admitting: Internal Medicine

## 2016-10-25 VITALS — BP 126/62 | HR 91 | Ht 76.0 in | Wt 281.0 lb

## 2016-10-25 DIAGNOSIS — I422 Other hypertrophic cardiomyopathy: Secondary | ICD-10-CM

## 2016-10-25 DIAGNOSIS — I495 Sick sinus syndrome: Secondary | ICD-10-CM

## 2016-10-25 DIAGNOSIS — Z950 Presence of cardiac pacemaker: Secondary | ICD-10-CM

## 2016-10-25 NOTE — Patient Instructions (Addendum)
Medication Instructions: - Your physician recommends that you continue on your current medications as directed. Please refer to the Current Medication list given to you today.   Labwork: - none ordered  Procedures/Testing: - none ordered  Follow-Up: - Remote monitoring is used to monitor your Pacemaker of ICD from home. This monitoring reduces the number of office visits required to check your device to one time per year. It allows [name] to keep an eye on the functioning of your device to ensure it is working properly. You are scheduled for a device check from home on 01/24/17. You may send your transmission at any time that day. If you have a wireless device, the transmission will be sent automatically. After your physician reviews your transmission, you will receive a postcard with your next transmission date.   - Your physician wants you to follow-up in: 6 months with Dr. Caryl Comes. You will receive a reminder letter in the mail two months in advance. If you don't receive a letter, please call our office to schedule the follow-up appointment.   Any Additional Special Instructions Will Be Listed Below (If Applicable).     If you need a refill on your cardiac medications before your next appointment, please call your pharmacy.

## 2016-10-25 NOTE — Progress Notes (Signed)
Patient Care Team: [name], FNP as PCP - General (Family Medicine) [name], MD as Consulting Physician (Podiatry) [name], MD as Consulting Physician (Nephrology)   HPI  David [name] is a 76 y.o. male Seen in followup for pacemaker implanted for sinus node dysfunction and dyspnea on exertion. He has been diagnosed with hypertrophic cardiomyopathy following review of his films by Dr. Farrel Conners at [facility] exhibited stable. He denies chest pain. He does have significant shortness of breath. This is worse over the last 3-6 months. He was seen recently by renal. He had significant edema in the increase his torsemide. It is mildly improved..  Echocardiogram 6/15 normal LV function and severe left ventricular hypertrophy with left atrial enlargement consistent with hypertensive heart disease  Evaluation [place] included a Myoview that was false positive catheterizations were undertaken in 2009, then, 2012 demonstrating 60% of the RCA, 70% in 240% circumflex and 60% OM1 40% LAD.     Date Cr K  3/18 2.45 5.4  5/18 1.9 4.9     In the interim he is seeing Dr. Broadus John who has initiated genetic testing. Reports were reviewed.  He is markedly limited in his activity related to dyspnea, weight, and feet following amputation   There is no family history of sudden death. There is no known history of HCM   Past Medical History:  Diagnosis Date  . Anemia, iron deficiency   . Anxiety   . Arthritis   . BPH (benign prostatic hypertrophy)   . CAD (coronary artery disease)    Nonobstructive CAD per cath  . Cardiac pacemaker in situ   . CHF (congestive heart failure) ([place])   . Chronic ulcer of right foot ([place])   . 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 Dyspnea   . History of cellulitis    right great toe 10-25-2014  . History of skin cancer   . HOCM (hypertrophic obstructive cardiomyopathy) ([place])     . Hypertension   . 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 Insulin pump in place   . OSA on CPAP   . Peripheral neuropathy    severe  . Peripheral vascular disease ([place])    bilateral lower extremities  . Secondary hyperparathyroidism of renal origin ([place])   . Sinus node dysfunction [facility])     Past Surgical History:  Procedure Laterality Date  . AMPUTATION OF REPLICATED TOES  Mar 8676   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)  . AMPUTATION TOE Right 03/12/2015   Procedure: RIGHT HALLUS AMPUTATION ;  Surgeon: [name], MD;  Location: [facility];  Service: Podiatry;  Laterality: Right;  . CARDIAC CATHETERIZATION  11-25-2010   [place], [place]   Nonobstructive CAD  . CARDIAC PACEMAKER PLACEMENT  Nov 2009   Medtronic  . EP IMPLANTABLE DEVICE N/A 06/03/2015   Procedure: PPM Generator Changeout;  Surgeon: [name], MD;  Location: [facility];  Service: Cardiovascular;  Laterality: N/A;  . EXCISION BONE CYST Right 03/06/2015   Procedure: BONE BIOPSIES OF RIGHT FOOT;  Surgeon: [name], MD;  Location: [facility];  Service: Podiatry;  Laterality: Right;  . ORIF ANKLE FRACTURE Left 11/06/2014   Procedure: OPEN REDUCTION INTERNAL FIXATION (ORIF) LEFT  ANKLE FRACTURE;  Surgeon: [name], MD;  Location: [facility];  Service: Orthopedics;  Laterality: Left;  . TOTAL KNEE ARTHROPLASTY    . VEIN LIGATION AND STRIPPING      Current Outpatient Prescriptions  Medication Sig Dispense Refill  . ACCU-CHEK SOFTCLIX LANCETS lancets USE AS INSTRUCTED TO CHECK BLOOD SUGAR 6 TIMES PER DAY Dx: Code E11.9 300 each 2  . aspirin EC 81 MG tablet Take 81 mg by mouth daily.    . Blood Glucose Monitoring Suppl (ACCU-CHEK AVIVA) device USE AS INSTRUCTED TO CHECK BLOOD SUGAR 6 TIMES PER DAY Dx: Code E11.9 1 each 0  . calcitRIOL (ROCALTROL) 0.25 MCG capsule Take 0.25 mcg by mouth daily.     .  carvedilol (COREG) 25 MG tablet Take 1 tablet (25 mg total) by mouth 2 (two) times daily with a meal. 90 tablet 1  . doxazosin (CARDURA) 4 MG tablet TAKE ONE TABLET BY MOUTH ONCE DAILY 90 tablet 0  . glucose blood (BAYER CONTOUR NEXT TEST) test strip Use to test blood sugar 3 times daily    Dx code is E11.49 100 each 3  . hydrocortisone cream 1 % Apply to affected area 2 times daily 15 g 0  . insulin aspart (NOVOLOG) 100 UNIT/ML injection USE MAX 120 UNITS PER DAY WITH CONTINUOUS INSULIN PUMP DX CODE E11.65 60 mL 3  . isosorbide mononitrate (IMDUR) 30 MG 24 hr tablet TAKE TWO TABLETS BY MOUTH ONCE DAILY 60 tablet 11  . liraglutide (VICTOZA) 18 MG/3ML SOPN INJECT 0.3MLS (1.8 MG TOTAL) INTO THE SKIN DAILY 9 pen 1  . lisinopril (PRINIVIL,ZESTRIL) 5 MG tablet Take 5 mg by mouth daily.    . mupirocin cream (BACTROBAN) 2 % Apply 1 application topically daily. Toe ulcer 15 g 0  . NOVOLOG 100 UNIT/ML injection USE MAX 120 UNITS PER DAY WITH CONTINUOUS INSULIN PUMP 60 mL 3  . Omega-3 Fatty Acids (FISH OIL) 1000 MG CAPS Take by mouth 2 (two) times daily.    . pantoprazole (PROTONIX) 40 MG tablet TAKE ONE TABLET BY MOUTH ONCE DAILY 90 tablet 1  . sertraline (ZOLOFT) 50 MG tablet TAKE ONE TABLET BY MOUTH ONCE DAILY 90 tablet 2  . torsemide (DEMADEX) 20 MG tablet Take 2 tablets (40 mg) by mouth in the am and 1 tablet (20 mg) in the pm     No current facility-administered medications for this visit.     No Known Allergies  Review of Systems negative except from HPI and PMH  Physical Exam BP 126/62   Pulse 91   Ht 6\' 4"  (1.93 m)   Wt 281 lb (127.5 kg)   SpO2 96%   BMI 34.20 kg/m  Well developed and well nourished in no acute distress HENT normal JVP 8 E scleral and icterus clear Neck Supples No clubbing cyanosis 1+ Edema R.>L  Alert and oriented, grossly normal motor and sensory function Skin Warm and Dry  ECG demonstrates atrial pacing at 64@intervals  24/12/44    Assessment and   Plan Sinus node dysfunction Pacemaker  Medtronic  Renal dysfunction Congestive Heart Failure Hypertension  Sleep apnea-treated Diabetes HCM  We reviewed the physiology of congestive heart failure in the setting of hypertrophic heart disease and HCM    Review of his device histograms demonstrated about 15-20% of his beats were faster than his lower rate in. Hence, we've reprogrammed his device taking his HDL rate from 95--85 his exertional rate from 1:30--1:15 and decreasing his slope from 3--2 in his 80s zone. He walked in the office and had less shortness of breath. Thank you  very outside Cole enjoyed he enjoys cc   We also reviewed dietary intake of sodium and reports for restriction. We also discussed the importance of exercise and the potential of water aerobics given his limitations based on weight and balance related to his prior amputations  More than 50% of 45 min was spent in counseling related to the above

## 2016-10-25 NOTE — Progress Notes (Signed)
Patient met with Pedorthist and picked up diabetic shoes (Men-Sierra- [id number]) with custom inserts. Patient to return as scheduled for diabetic foot care. -Dr. Cannon Kettle

## 2016-10-28 ENCOUNTER — Other Ambulatory Visit: Payer: Self-pay | Admitting: Family

## 2016-10-31 ENCOUNTER — Other Ambulatory Visit: Payer: Self-pay

## 2016-10-31 ENCOUNTER — Encounter: Payer: Self-pay | Admitting: Endocrinology

## 2016-10-31 ENCOUNTER — Ambulatory Visit (INDEPENDENT_AMBULATORY_CARE_PROVIDER_SITE_OTHER): Payer: Medicare Other | Admitting: Endocrinology

## 2016-10-31 VITALS — BP 138/68 | HR 66 | Ht 76.0 in | Wt 281.6 lb

## 2016-10-31 DIAGNOSIS — E1165 Type 2 diabetes mellitus with hyperglycemia: Secondary | ICD-10-CM

## 2016-10-31 DIAGNOSIS — Z794 Long term (current) use of insulin: Secondary | ICD-10-CM | POA: Diagnosis not present

## 2016-10-31 MED ORDER — INSULIN REGULAR HUMAN (CONC) 500 UNIT/ML ~~LOC~~ SOLN: SUBCUTANEOUS | 1 refills | Status: DC

## 2016-10-31 NOTE — Patient Instructions (Addendum)
Take U-500 insulin 30 min before the meal   It is very important to do boluses as follows  1.  Every time you check blood sugar unless the blood sugar is below 120 units need to take a correction bolus  2.  Every time you eat a meal or have any carbohydrates including fruit  will need to count carbohydrates and do a bolus

## 2016-10-31 NOTE — Progress Notes (Signed)
Patient ID: David [name], male   DOB: 05/23/1940, 76 y.o.   MRN: [id number]    Reason for Appointment: Followup for Type 2 Diabetes  History of Present Illness:          Diagnosis: Type 2 diabetes mellitus, date of diagnosis:   1992       Past history:  He was initially treated with metformin and at some point also glipizide. His previous records from out of town are not available and no information is available about his level of control Apparently he was started on insulin in 1994 approximately because of poor control He has been on various insulin regimens over the last several years However even with insulin he has had poor control for at least the last 7 or 8 years. He does not know what his previous A1c levels have been. He had been continued on metformin and glipizide but metformin stopped about 2 years ago because of kidney function abnormality He had been taking Lantus 60 units twice a day with NovoLog previously and also Before his initial consultation in 6/15 he was on NovoLog twice a day and Humalog mix insulin  Because of poor control and large insulin doses he was started on Victoza in 6/15  Recent history:   INSULIN PUMP  regimen is as follows  Basal rates  3.0 at midnight, 3.8 at 6 AM, 3.9 at 1 PM and 3.0 at 10 PM  Carbohydrate coverage 1:5 until 12 noon, 1:7 until 6 PM, 1:5 at 6 PM, correction 1:10 with target 120-150  Average total daily insulin 101+/- 15 units   Since 04/28/14 he has been on a [employer] insulin pump because of persistent poor control and high insulin requirement  Initially with the pump his blood sugars had been significantly better but subsequently blood sugars have been again high with A1c mostly over 8% A1c recently higher again at 9.1 Fructosamine is high at 328  Current management, problems and blood sugar patterns:  He has again higher readings compared to the last visit  His wife came in today and reports that he has had  some difficulty with memory and is most likely not able to keep up with his day-to-day management with the pump including boluses  FASTING readings are mostly high and only rarely below 200  His wife says that he apparently had been having more swelling and because of his edema he was told to cut back on his high sodium foods including lunch meats and hot dogs which he was eating daily  Not clear if this is improving his blood sugars significantly as he still has some significantly high readings recently and only occasionally better in the afternoon  His bolus frequency is inadequate and he is missing boluses for meals and snacks as well as not bolusing for high sugars  Also not checking POSTPRANDIAL readings after his meals especially in the evening when he is eating most of his food.   Only occasionally will have improved blood sugars with doing boluses around mealtimes  He is doing 1.8 mg Victoza and he thinks he is taking that every morning, however he is still tending to snack especially late at night with foods like half a peanut butter sandwich       Oral hypoglycemic drugs the patient is taking are: None, has renal dysfunction        Glucose monitoring:  done about 3 times a day  Glucometer:  contour     Blood Glucose readings from pump download:  Mean values apply above for all meters except median for One Touch  PRE-MEAL Fasting Lunch Dinner Bedtime Overall  Glucose range: 144-352  165-337  72-373  ?     Mean/median: 283   228   241   POST-MEAL PC Breakfast PC Lunch PC Dinner  Glucose range:  79-240   ?    Mean/median:        Mean values apply above for all meters except median for One Touch  PRE-MEAL Fasting Afternoon  Dinner Bedtime Overall  Glucose range: 198-295  86-346  150-281     Mean/median:   246   233    Hypoglycemia: None   Self-care:   Meals: 2- 3 meals per day. May skip breakfast otherwise breakfast is either cereal or eggs and toast, lunch is  usually a sandwich at 1 pm , dinner at  6-7 pm.    He thinks his portions are small but periodically eats sweets.   Bedtime snack is usually crackers Or fruit like peaches from cans          Exercise:  Walking a little recently   Dietician visit: Most recent: several years ago.       Wt Readings from Last 3 Encounters:  10/31/16 281 lb 9.6 oz (127.7 kg)  10/25/16 281 lb (127.5 kg)  09/15/16 283 lb (128.4 kg)   Glycemic control:   Lab Results  Component Value Date   HGBA1C 9.1 (H) 10/07/2016   HGBA1C 8.1 (H) 05/30/2016   HGBA1C 7.5 03/23/2016   Lab Results  Component Value Date   MICROALBUR 7.7 (H) 10/07/2016   LDLCALC 49 10/29/2013   CREATININE 1.90 (H) 10/07/2016            Lab Results  Component Value Date   FRUCTOSAMINE 328 (H) 08/05/2016   FRUCTOSAMINE 326 (H) 01/30/2014     Other active problems: See review of systems   Allergies as of 10/31/2016   No Known Allergies     Medication List       Accurate as of 10/31/16 10:01 AM. Always use your most recent med list.          ACCU-CHEK AVIVA device USE AS INSTRUCTED TO CHECK BLOOD SUGAR 6 TIMES PER DAY Dx: Code E11.9   ACCU-CHEK SOFTCLIX LANCETS lancets USE AS INSTRUCTED TO CHECK BLOOD SUGAR 6 TIMES PER DAY Dx: Code E11.9   aspirin EC 81 MG tablet Take 81 mg by mouth daily.   calcitRIOL 0.25 MCG capsule Commonly known as:  ROCALTROL Take 0.25 mcg by mouth daily.   carvedilol 25 MG tablet Commonly known as:  COREG Take 1 tablet (25 mg total) by mouth 2 (two) times daily with a meal.   doxazosin 4 MG tablet Commonly known as:  CARDURA TAKE ONE TABLET BY MOUTH ONCE DAILY   Fish Oil 1000 MG Caps Take by mouth 2 (two) times daily.   glucose blood test strip Commonly known as:  BAYER CONTOUR NEXT TEST Use to test blood sugar 3 times daily    Dx code is E11.49   hydrocortisone cream 1 % Apply to affected area 2 times daily   insulin aspart 100 UNIT/ML injection Commonly known as:   NOVOLOG USE MAX 120 UNITS PER DAY WITH CONTINUOUS INSULIN PUMP DX CODE E11.65   isosorbide mononitrate 30 MG 24 hr tablet Commonly known as:  IMDUR TAKE TWO TABLETS BY MOUTH ONCE DAILY  liraglutide 18 MG/3ML Sopn Commonly known as:  VICTOZA INJECT 0.3MLS (1.8 MG TOTAL) INTO THE SKIN DAILY   lisinopril 5 MG tablet Commonly known as:  PRINIVIL,ZESTRIL Take 5 mg by mouth daily.   mupirocin cream 2 % Commonly known as:  BACTROBAN Apply 1 application topically daily. Toe ulcer   pantoprazole 40 MG tablet Commonly known as:  PROTONIX TAKE ONE TABLET BY MOUTH ONCE DAILY   sertraline 50 MG tablet Commonly known as:  ZOLOFT TAKE ONE TABLET BY MOUTH ONCE DAILY   torsemide 20 MG tablet Commonly known as:  DEMADEX Take 2 tablets (40 mg) by mouth in the am and 1 tablet (20 mg) in the pm       Allergies: No Known Allergies  Past Medical History:  Diagnosis Date  . Anemia, iron deficiency   . Anxiety   . Arthritis   . BPH (benign prostatic hypertrophy)   . CAD (coronary artery disease)    Nonobstructive CAD per cath  . Cardiac pacemaker in situ   . CHF (congestive heart failure) ([place])   . Chronic ulcer of right foot ([place])   . 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 Dyspnea   . History of cellulitis    right great toe 10-25-2014  . History of skin cancer   . HOCM (hypertrophic obstructive cardiomyopathy) ([place])   . Hypertension   . Insulin dependent type 2 diabetes mellitus ([place]) 1991   followd by [name]--  has insulin pump  . Insulin pump in place   . OSA on CPAP   . Peripheral neuropathy    severe  . Peripheral vascular disease ([place])    bilateral lower extremities  . Secondary hyperparathyroidism of renal origin ([place])   . Sinus node dysfunction [facility])     Past Surgical History:  Procedure Laterality Date  . 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 AMPUTATION TOE Right  03/12/2015   Procedure: RIGHT HALLUS AMPUTATION ;  Surgeon: [name], MD;  Location: [place];  Service: Podiatry;  Laterality: Right;  . CARDIAC CATHETERIZATION  11-25-2010   [place], [place]   Nonobstructive CAD  . CARDIAC PACEMAKER PLACEMENT  Nov 2009   [employer]  . EP IMPLANTABLE DEVICE N/A 06/03/2015   Procedure: PPM Generator Changeout;  Surgeon: [name], MD;  Location: [place] [facility];  Service: Cardiovascular;  Laterality: N/A;  . EXCISION BONE CYST Right 03/06/2015   Procedure: BONE BIOPSIES OF RIGHT FOOT;  Surgeon: [name], MD;  Location: [place];  Service: Podiatry;  Laterality: Right;  . ORIF ANKLE FRACTURE Left 11/06/2014   Procedure: OPEN REDUCTION INTERNAL FIXATION (ORIF) LEFT  ANKLE FRACTURE;  Surgeon: [name], MD;  Location: [place];  Service: Orthopedics;  Laterality: Left;  . TOTAL KNEE ARTHROPLASTY    . VEIN LIGATION AND STRIPPING      Family History  Problem Relation Age of Onset  . Cancer Mother        breast  . Heart attack Father     Social History:  reports that he quit smoking about 32 years ago. He has a 60.00 pack-year smoking history. He has quit using smokeless tobacco. He reports that he drinks about 0.6 oz of alcohol per week . He reports that he does not use drugs.    Review of Systems  Lipids: Had been on a Lipitor since late 2014. Also had taken Gembrozil for several years without apparent side effects.  Both of these were  stopped because of increased liver functions and LFT has been back to normal LDL has not been high subsequently but HDL is consistently low  He is taking  fish oil and LDL is within target but triglycerides are Persistently high       Lab Results  Component Value Date   CHOL 152 08/05/2016   HDL 19.90 (L) 08/05/2016   LDLCALC 49 10/29/2013   LDLDIRECT 77.0 08/05/2016   TRIG 243.0 (H) 08/05/2016   CHOLHDL 8 08/05/2016      HYPERTENSION:  The blood pressure has been treated with various drugs.   Currently taking carvedilol, lisinopril 5 mg and doxazosin 4 mg Twice a day  Chronic kidney disease: Etiology unknown, is followed by nephrologist  Has diuretic was increased Last creatinine:  Lab Results  Component Value Date   CREATININE 1.90 (H) 10/07/2016        Has history of Numbness in his feet  since about 2013, no tingling or burning in feet    He has been wearing diabetic shoes.  He has been followed by podiatrist Including in March  He is  under the care of a podiatrist for Regular care And still has had a small ulcer on the left toe   His wife says that he is having memory problems now    Physical Examination:  BP 138/68   Pulse 66   Ht 6\' 4"  (1.93 m)   Wt 281 lb 9.6 oz (127.7 kg)   SpO2 98%   BMI 34.28 kg/m     ASSESSMENT:  Diabetes type 2, uncontrolled with obesity See history of present illness for detailed discussion of current diabetes management, blood sugar patterns and problems identified   His last A1c was higher at 9.1  His blood sugars are overall still poorly controlled, Recently appear higher  He is getting more insulin resistant but also is having Issues with compliance with his boluses and also diet Apparently because of having difficulty with memory issues and has not being able to deal with his pump adequately his wife is helping him now She does not understand use of the pump very much and needs to get more education to help him manage this He does need a more active insulin for his needs, currently taking about 100 units a day with NovoLog  PLAN:   Trial of U-500 insulin in the pump Discussed in detail how this works and how it is different than NovoLog insulin Discussed bolusing 30 minutes before eating He needs to continue to cut back on high-fat foods and also snacks Discussed with him and his wife that he will need to bolus more consistently for all meals and snacks as well  as high readings, given printed instructions given   BASAL rates:  With U-500 insulin he will use the following settings 0.55 at midnight, 0.8 at 6 AM, 0.85 at 1 PM and 0.6 at 9 PM  His bolus settings will be changed to a carbohydrate ratio of 1:25, sensitivity 1:50 with target 120-150 Active insulin time = 6 hours  Hypertension: Blood pressure is better with more diuresis and reduced sodium intake    Counseling time on subjects discussed above is over 50% of today's 25 minute visit   There are no Patient Instructions on file for this visit.  David [name] 10/31/2016, 10:01 AM  Note: This office note was prepared with Estate agent. Any transcriptional errors that result from this process are unintentional.

## 2016-11-01 ENCOUNTER — Ambulatory Visit (INDEPENDENT_AMBULATORY_CARE_PROVIDER_SITE_OTHER): Payer: Medicare Other | Admitting: Sports Medicine

## 2016-11-01 ENCOUNTER — Encounter: Payer: Self-pay | Admitting: Sports Medicine

## 2016-11-01 DIAGNOSIS — L97521 Non-pressure chronic ulcer of other part of left foot limited to breakdown of skin: Secondary | ICD-10-CM | POA: Diagnosis not present

## 2016-11-01 DIAGNOSIS — E0842 Diabetes mellitus due to underlying condition with diabetic polyneuropathy: Secondary | ICD-10-CM

## 2016-11-01 DIAGNOSIS — M2042 Other hammer toe(s) (acquired), left foot: Secondary | ICD-10-CM

## 2016-11-01 NOTE — Progress Notes (Signed)
Subjective: David [name] is a 76 y.o. male patient seen in office for follow up evaluation of ulceration of the left 3rd toe and for diabetic shoe check. Patient has a history of diabetes and a blood glucose that is "good". Patient is changing the dressing using Bactroban at home with help from wife. Denies nausea/fever/vomiting/chills/night sweats/shortness of breath/pain. Patient has no other pedal complaints at this time.  Patient Active Problem List   Diagnosis Date Noted  . 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 Otitis, externa, infective 03/11/2016  . Rib fracture 07/24/2015  . Syncope 07/24/2015  . 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 Osteomyelitis of foot, right, acute ([place]) 05/04/2015  . Ankle fracture, left 10/29/2014  . OSA on CPAP 10/26/2014  . Back pain 10/16/2014  . Left leg numbness 10/16/2014  . Fatigue 10/16/2014  . Impacted cerumen of both ears 10/16/2014  . Episodic lightheadedness 08/04/2014  . Open toe wound 07/18/2014  . Diabetes mellitus with neurological manifestations, uncontrolled ([place]) 02/27/2014  . Mixed hyperlipidemia 02/27/2014  . Seasonal and perennial allergic rhinitis 10/25/2013  . Essential hypertension 10/25/2013  . Pure hypercholesterolemia 10/25/2013  . Obesity 10/25/2013  . Renal insufficiency 10/25/2013   Current Outpatient Prescriptions on File Prior to Visit  Medication Sig Dispense Refill  . ACCU-CHEK SOFTCLIX LANCETS lancets USE AS INSTRUCTED TO CHECK BLOOD SUGAR 6 TIMES PER DAY Dx: Code E11.9 300 each 2  . aspirin EC 81 MG tablet Take 81 mg by mouth daily.    . Blood Glucose Monitoring Suppl (ACCU-CHEK AVIVA) device USE AS INSTRUCTED TO CHECK BLOOD SUGAR 6 TIMES PER DAY Dx: Code E11.9 (Patient not taking: Reported on 10/31/2016) 1 each 0  . calcitRIOL (ROCALTROL) 0.25 MCG capsule Take 0.25 mcg by mouth daily.     . carvedilol (COREG) 25 MG tablet Take 1 tablet (25 mg total) by mouth 2 (two) times daily  with a meal. 90 tablet 1  . doxazosin (CARDURA) 4 MG tablet TAKE ONE TABLET BY MOUTH ONCE DAILY 90 tablet 0  . glucose blood (BAYER CONTOUR NEXT TEST) test strip Use to test blood sugar 3 times daily    Dx code is E11.49 100 each 3  . hydrocortisone cream 1 % Apply to affected area 2 times daily 15 g 0  . insulin aspart (NOVOLOG) 100 UNIT/ML injection USE MAX 120 UNITS PER DAY WITH CONTINUOUS INSULIN PUMP DX CODE E11.65 60 mL 3  . insulin regular human CONCENTRATED (HUMULIN R) 500 UNIT/ML injection Use 0.25 mL daily or as directed in insulin pump, Dx code E11.65 20 mL 1  . isosorbide mononitrate (IMDUR) 30 MG 24 hr tablet TAKE TWO TABLETS BY MOUTH ONCE DAILY 60 tablet 11  . liraglutide (VICTOZA) 18 MG/3ML SOPN INJECT 0.3MLS (1.8 MG TOTAL) INTO THE SKIN DAILY 9 pen 1  . lisinopril (PRINIVIL,ZESTRIL) 5 MG tablet Take 5 mg by mouth daily.    . mupirocin cream (BACTROBAN) 2 % Apply 1 application topically daily. Toe ulcer 15 g 0  . Omega-3 Fatty Acids (FISH OIL) 1000 MG CAPS Take by mouth 2 (two) times daily.    . pantoprazole (PROTONIX) 40 MG tablet TAKE ONE TABLET BY MOUTH ONCE DAILY 90 tablet 1  . sertraline (ZOLOFT) 50 MG tablet TAKE ONE TABLET BY MOUTH ONCE DAILY 90 tablet 2  . torsemide (DEMADEX) 20 MG tablet Take 2 tablets (40 mg) by mouth in the am and 1 tablet (20 mg) in the pm    .  torsemide (DEMADEX) 20 MG tablet TAKE TWO TABLETS BY MOUTH ONCE DAILY 180 tablet 1   No current facility-administered medications on file prior to visit.    No Known Allergies  Recent Results (from the past 2160 hour(s))  Basic metabolic panel     Status: Abnormal   Collection Time: 08/05/16 10:16 AM  Result Value Ref Range   Sodium 136 135 - 145 mEq/L   Potassium 5.4 (H) 3.5 - 5.1 mEq/L   Chloride 105 96 - 112 mEq/L   CO2 25 19 - 32 mEq/L   Glucose, Bld 263 (H) 70 - 99 mg/dL   BUN 47 (H) 6 - 23 mg/dL   Creatinine, Ser 2.45 (H) 0.40 - 1.50 mg/dL   Calcium 9.3 8.4 - 10.5 mg/dL   GFR 27.46 (L) >60.00  mL/min  Fructosamine     Status: Abnormal   Collection Time: 08/05/16 10:16 AM  Result Value Ref Range   Fructosamine 328 (H) 0 - 285 umol/L    Comment: Published reference interval for apparently healthy subjects between age 20 and 60 is 205 - 285 umol/L and in a poorly controlled diabetic population is 228 - 563 umol/L with a mean of 396 umol/L.   Lipid panel     Status: Abnormal   Collection Time: 08/05/16 10:16 AM  Result Value Ref Range   Cholesterol 152 0 - 200 mg/dL    Comment: ATP III Classification       Desirable:  < 200 mg/dL               Borderline High:  200 - 239 mg/dL          High:  > = 240 mg/dL   Triglycerides 243.0 (H) 0.0 - 149.0 mg/dL    Comment: Normal:  <150 mg/dLBorderline High:  150 - 199 mg/dL   HDL 19.90 (L) >39.00 mg/dL   VLDL 48.6 (H) 0.0 - 40.0 mg/dL   Total CHOL/HDL Ratio 8     Comment:                Men          Women1/2 Average Risk     3.4          3.3Average Risk          5.0          4.42X Average Risk          9.6          7.13X Average Risk          15.0          11.0                       NonHDL 132.27     Comment: NOTE:  Non-HDL goal should be 30 mg/dL higher than patient's LDL goal (i.e. LDL goal of < 70 mg/dL, would have non-HDL goal of < 100 mg/dL)  LDL cholesterol, direct     Status: None   Collection Time: 08/05/16 10:16 AM  Result Value Ref Range   Direct LDL 77.0 mg/dL    Comment: Optimal:  <100 mg/dLNear or Above Optimal:  100-129 mg/dLBorderline High:  130-159 mg/dLHigh:  160-189 mg/dLVery High:  >190 mg/dL  CUP PACEART REMOTE DEVICE CHECK     Status: None   Collection Time: 08/23/16  2:40 PM  Result Value Ref Range   Date Time Interrogation Session [id number]    Pulse Generator Manufacturer [employer]      Pulse Gen Model [id number] Advisa DR MRI    Pulse Gen Serial Number [id number]    Clinic Name [facility]    Implantable Pulse Generator Type Implantable Pulse Generator    Implantable Pulse Generator Implant Date 14970263     Implantable Lead Manufacturer [facility]    Implantable Lead Model 5076 CapSureFix Novus    Implantable Lead Serial Number [id number]    Implantable Lead Implant Date 41287867    Implantable Lead Location Detail 1 UNKNOWN    Implantable Lead Location [phone]    Implantable Lead Manufacturer [facility]    Implantable Lead Model 5076 CapSureFix Novus    Implantable Lead Serial Number [id number]    Implantable Lead Implant Date 94765465    Implantable Lead Location Detail 1 UNKNOWN    Implantable Lead Location [phone]    Lead Channel Setting Sensing Sensitivity 2.8 mV   Lead Channel Setting Pacing Amplitude 2.5 V   Lead Channel Setting Pacing Pulse Width 0.4 ms   Lead Channel Setting Pacing Amplitude 2.5 V   Lead Channel Impedance Value 513 ohm   Lead Channel Impedance Value 380 ohm   Lead Channel Sensing Intrinsic Amplitude 1.375 mV   Lead Channel Sensing Intrinsic Amplitude 1.375 mV   Lead Channel Pacing Threshold Amplitude 2.25 V   Lead Channel Pacing Threshold Pulse Width 0.4 ms   Lead Channel Impedance Value 513 ohm   Lead Channel Impedance Value 399 ohm   Lead Channel Sensing Intrinsic Amplitude 21.625 mV   Lead Channel Sensing Intrinsic Amplitude 21.625 mV   Lead Channel Pacing Threshold Amplitude 1 V   Lead Channel Pacing Threshold Pulse Width 0.4 ms   Battery Status OK    Battery Remaining Longevity 85 mo   Battery Voltage 3.00 V   Brady Statistic RA Percent Paced 99.59 %   Brady Statistic RV Percent Paced 0.05 %   Brady Statistic AP VP Percent 0.05 %   Brady Statistic AS VP Percent 0 %   Brady Statistic AP VS Percent 99.55 %   Brady Statistic AS VS Percent 0.40 %   Eval Rhythm Ap/Vs   HM DIABETES EYE EXAM     Status: Abnormal   Collection Time: 09/23/16 12:00 AM  Result Value Ref Range   HM Diabetic Eye Exam Retinopathy (A) No Retinopathy  HM DIABETES EYE EXAM     Status: Abnormal   Collection Time: 09/23/16 12:00 AM  Result Value Ref Range   HM Diabetic Eye Exam Retinopathy (A)  No Retinopathy  CBG monitoring, ED     Status: Abnormal   Collection Time: 09/26/16  5:18 PM  Result Value Ref Range   Glucose-Capillary 212 (H) 65 - 99 mg/dL   Comment 1 Notify RN    Comment 2 Document in Chart   CBC WITH DIFFERENTIAL     Status: Abnormal   Collection Time: 09/26/16  6:31 PM  Result Value Ref Range   WBC 8.9 4.0 - 10.5 K/uL   RBC 3.92 (L) 4.22 - 5.81 MIL/uL   Hemoglobin 11.7 (L) 13.0 - 17.0 g/dL   HCT 34.8 (L) 39.0 - 52.0 %   MCV 88.8 78.0 - 100.0 fL   MCH 29.8 26.0 - 34.0 pg   MCHC 33.6 30.0 - 36.0 g/dL   RDW 14.1 11.5 - 15.5 %   Platelets 165 150 - 400 K/uL   Neutrophils Relative % 77 %   Neutro Abs 6.8 1.7 - 7.7 K/uL   Lymphocytes Relative 13 %   Lymphs Abs 1.2  0.7 - 4.0 K/uL   Monocytes Relative 8 %   Monocytes Absolute 0.7 0.1 - 1.0 K/uL   Eosinophils Relative 2 %   Eosinophils Absolute 0.2 0.0 - 0.7 K/uL   Basophils Relative 0 %   Basophils Absolute 0.0 0.0 - 0.1 K/uL  Comprehensive metabolic panel     Status: Abnormal   Collection Time: 09/26/16  6:31 PM  Result Value Ref Range   Sodium 134 (L) 135 - 145 mmol/L   Potassium 4.8 3.5 - 5.1 mmol/L   Chloride 104 101 - 111 mmol/L   CO2 21 (L) 22 - 32 mmol/L   Glucose, Bld 197 (H) 65 - 99 mg/dL   BUN 42 (H) 6 - 20 mg/dL   Creatinine, Ser 1.92 (H) 0.61 - 1.24 mg/dL   Calcium 9.4 8.9 - 10.3 mg/dL   Total Protein 6.9 6.5 - 8.1 g/dL   Albumin 3.8 3.5 - 5.0 g/dL   AST 18 15 - 41 U/L   ALT 19 17 - 63 U/L   Alkaline Phosphatase 84 38 - 126 U/L   Total Bilirubin 0.7 0.3 - 1.2 mg/dL   GFR calc non Af Amer 32 (L) >60 mL/min   GFR calc Af Amer 37 (L) >60 mL/min    Comment: (NOTE) The eGFR has been calculated using the CKD EPI equation. This calculation has not been validated in all clinical situations. eGFR's persistently <60 mL/min signify possible Chronic Kidney Disease.    Anion gap 9 5 - 15  Protime-INR     Status: None   Collection Time: 09/26/16  6:31 PM  Result Value Ref Range   Prothrombin  Time 14.3 11.4 - 15.2 seconds   INR 1.10   I-stat troponin, ED     Status: None   Collection Time: 09/26/16  6:59 PM  Result Value Ref Range   Troponin i, poc 0.07 0.00 - 0.08 ng/mL   Comment 3            Comment: Due to the release kinetics of cTnI, a negative result within the first hours of the onset of symptoms does not rule out myocardial infarction with certainty. If myocardial infarction is still suspected, repeat the test at appropriate intervals.   I-Stat venous blood gas, ED     Status: Abnormal   Collection Time: 09/26/16  7:00 PM  Result Value Ref Range   pH, Ven 7.292 7.250 - 7.430   pCO2, Ven 43.8 (L) 44.0 - 60.0 mmHg   pO2, Ven 39.0 32.0 - 45.0 mmHg   Bicarbonate 21.2 20.0 - 28.0 mmol/L   TCO2 22 0 - 100 mmol/L   O2 Saturation 67.0 %   Acid-base deficit 5.0 (H) 0.0 - 2.0 mmol/L   Patient temperature HIDE    Sample type VENOUS   I-stat chem 8, ED     Status: Abnormal   Collection Time: 09/26/16  7:02 PM  Result Value Ref Range   Sodium 137 135 - 145 mmol/L   Potassium 4.8 3.5 - 5.1 mmol/L   Chloride 104 101 - 111 mmol/L   BUN 40 (H) 6 - 20 mg/dL   Creatinine, Ser 2.00 (H) 0.61 - 1.24 mg/dL   Glucose, Bld 200 (H) 65 - 99 mg/dL   Calcium, Ion 1.24 1.15 - 1.40 mmol/L   TCO2 23 0 - 100 mmol/L   Hemoglobin 11.6 (L) 13.0 - 17.0 g/dL   HCT 34.0 (L) 39.0 - 52.0 %  Urinalysis, Complete w Microscopic       Status: Abnormal   Collection Time: 09/26/16  9:05 PM  Result Value Ref Range   Color, Urine YELLOW YELLOW   APPearance CLEAR CLEAR   Specific Gravity, Urine 1.009 1.005 - 1.030   pH 5.0 5.0 - 8.0   Glucose, UA 150 (A) NEGATIVE mg/dL   Hgb urine dipstick NEGATIVE NEGATIVE   Bilirubin Urine NEGATIVE NEGATIVE   Ketones, ur NEGATIVE NEGATIVE mg/dL   Protein, ur NEGATIVE NEGATIVE mg/dL   Nitrite NEGATIVE NEGATIVE   Leukocytes, UA NEGATIVE NEGATIVE   RBC / HPF NONE SEEN 0 - 5 RBC/hpf   WBC, UA 0-5 0 - 5 WBC/hpf   Bacteria, UA RARE (A) NONE SEEN   Squamous  Epithelial / LPF 0-5 (A) NONE SEEN   Mucous PRESENT    Hyaline Casts, UA PRESENT   Hemoglobin A1c     Status: Abnormal   Collection Time: 10/07/16  1:34 PM  Result Value Ref Range   Hgb A1c MFr Bld 9.1 (H) 4.6 - 6.5 %    Comment: Glycemic Control Guidelines for People with Diabetes:Non Diabetic:  <6%Goal of Therapy: <7%Additional Action Suggested:  >0%   Basic metabolic panel     Status: Abnormal   Collection Time: 10/07/16  1:34 PM  Result Value Ref Range   Sodium 138 135 - 145 mEq/L   Potassium 4.9 3.5 - 5.1 mEq/L   Chloride 110 96 - 112 mEq/L   CO2 22 19 - 32 mEq/L   Glucose, Bld 175 (H) 70 - 99 mg/dL   BUN 40 (H) 6 - 23 mg/dL   Creatinine, Ser 1.90 (H) 0.40 - 1.50 mg/dL   Calcium 9.2 8.4 - 10.5 mg/dL   GFR 36.80 (L) >60.00 mL/min  Microalbumin / creatinine urine ratio     Status: Abnormal   Collection Time: 10/07/16  1:34 PM  Result Value Ref Range   Microalb, Ur 7.7 (H) 0.0 - 1.9 mg/dL   Creatinine,U 69.6 mg/dL   Microalb Creat Ratio 11.1 0.0 - 30.0 mg/g    Objective: There were no vitals filed for this visit.  General: Patient is awake, alert, oriented x 3 and in no acute distress.  Dermatology: Skin is warm and dry bilateral with a partial thickness ulceration present left 3rd toe distal tuft. Ulceration measures 0.3cm x 0.5 cm x 0.1cm, same as previous. There is a keratotic border with a granular base. The ulceration does not probe to bone. There is no malodor, no active drainage, no erythema, no edema. No acute signs of infection. Nails are short, thick, and mycotic.    Vascular: Dorsalis Pedis pulse = 1/4 Bilateral,  Posterior Tibial pulse = 1/4 Bilateral,  Capillary Fill Time < 5 seconds  Neurologic: Protective sensation absent to the level of knee using  the 5.07/10g [employer].  Musculosketal:  No Pain with palpation to ulcerated area. No pain with compression to calves bilateral. Hammertoe and history of toe amps on right.  Assessment and  Plan:  Problem List Items Addressed This Visit    None    Visit Diagnoses    Toe ulcer, left, limited to breakdown of skin ([place])    -  Primary   Hammertoe of left foot       Diabetic polyneuropathy associated with diabetes mellitus due to underlying condition ([place])          -Complete examination performed -Discussed the progression of the wound and treatment alternatives. - Excisionally dedbrided ulceration at left 3rd toe to healthy bleeding borders using  a sterile chisel  blade. -Applied PRISMA and bandaid dressing and instructed patient to continue with daily dressings at home consisting of the same - Advised patient to go to the ER or return to office if the wound worsens or if constitutional symptoms are present. -Continue with diabetic shoes  -Patient to return to office in 3-4 weeks for follow up care and evaluation or sooner if problems arise. May repeat xray at next visit if continues to fail to improve.   , DPM 

## 2016-11-02 ENCOUNTER — Encounter: Payer: Medicare Other | Attending: Endocrinology | Admitting: Nutrition

## 2016-11-02 ENCOUNTER — Telehealth: Payer: Self-pay | Admitting: Family

## 2016-11-02 DIAGNOSIS — E1165 Type 2 diabetes mellitus with hyperglycemia: Secondary | ICD-10-CM | POA: Diagnosis not present

## 2016-11-02 DIAGNOSIS — Z794 Long term (current) use of insulin: Secondary | ICD-10-CM | POA: Diagnosis not present

## 2016-11-02 DIAGNOSIS — IMO0002 Reserved for concepts with insufficient information to code with codable children: Secondary | ICD-10-CM

## 2016-11-02 DIAGNOSIS — Z713 Dietary counseling and surveillance: Secondary | ICD-10-CM | POA: Insufficient documentation

## 2016-11-02 DIAGNOSIS — E1149 Type 2 diabetes mellitus with other diabetic neurological complication: Secondary | ICD-10-CM

## 2016-11-02 NOTE — Telephone Encounter (Signed)
Called [facility] and gave lady last OV date. She gave me fax # [phone] so I can send over last OV notes to them today.

## 2016-11-02 NOTE — Telephone Encounter (Signed)
[facility] calling for pt last appointment and medical notes. Please call back at number provided.  Pt reference #: [id number]  Thank you,  -LL

## 2016-11-04 DIAGNOSIS — R3911 Hesitancy of micturition: Secondary | ICD-10-CM | POA: Diagnosis not present

## 2016-11-04 DIAGNOSIS — E875 Hyperkalemia: Secondary | ICD-10-CM | POA: Diagnosis not present

## 2016-11-04 DIAGNOSIS — D509 Iron deficiency anemia, unspecified: Secondary | ICD-10-CM | POA: Diagnosis not present

## 2016-11-04 DIAGNOSIS — I129 Hypertensive chronic kidney disease with stage 1 through stage 4 chronic kidney disease, or unspecified chronic kidney disease: Secondary | ICD-10-CM | POA: Diagnosis not present

## 2016-11-04 DIAGNOSIS — R809 Proteinuria, unspecified: Secondary | ICD-10-CM | POA: Diagnosis not present

## 2016-11-04 DIAGNOSIS — N2581 Secondary hyperparathyroidism of renal origin: Secondary | ICD-10-CM | POA: Diagnosis not present

## 2016-11-04 DIAGNOSIS — E1129 Type 2 diabetes mellitus with other diabetic kidney complication: Secondary | ICD-10-CM | POA: Diagnosis not present

## 2016-11-04 DIAGNOSIS — N183 Chronic kidney disease, stage 3 (moderate): Secondary | ICD-10-CM | POA: Diagnosis not present

## 2016-11-04 DIAGNOSIS — N401 Enlarged prostate with lower urinary tract symptoms: Secondary | ICD-10-CM | POA: Diagnosis not present

## 2016-11-04 DIAGNOSIS — G4730 Sleep apnea, unspecified: Secondary | ICD-10-CM | POA: Diagnosis not present

## 2016-11-04 DIAGNOSIS — F419 Anxiety disorder, unspecified: Secondary | ICD-10-CM | POA: Diagnosis not present

## 2016-11-05 ENCOUNTER — Other Ambulatory Visit: Payer: Self-pay | Admitting: Internal Medicine

## 2016-11-10 ENCOUNTER — Encounter: Payer: Self-pay | Admitting: Genetic Counselor

## 2016-11-10 ENCOUNTER — Ambulatory Visit: Payer: Medicare Other | Admitting: Genetic Counselor

## 2016-11-10 DIAGNOSIS — I422 Other hypertrophic cardiomyopathy: Secondary | ICD-10-CM

## 2016-11-10 NOTE — Progress Notes (Signed)
Post-test GC notes  David [name] is here today with his wife for a post-test GC consult. 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 I explained to him that a negative test result indicates that while he does not have a genetic mutation in the major genes that cause HCM, it is likely that there is a genetic change in the regulatory region of the gene or in an as yet unidentified HCM gene. He understands the relevance of his test report.   In light of a negative result, David [name] has the opportunity to enroll in the Cardiomyopathy Research study at the [place] [facility] in [employer]. He was not interested at this time to enroll in the study.   David [name] has three kids. I recommended that he inform his family about his HCM test result and they update their primary care provider about his diagnosis so they can obtain an EKG and echocardiogram at appropriate intervals.  In addition, we discussed the protections afforded by the Genetic Information Non-Discrimination Act (GINA). I explained to him that GINA protects him from losing his employment or health insurance due to his genetic status for a disease. However, these protections do not cover life insurance and disability. He comprehended the significance of this information. I recommended that he inform his adult kids to obtain appropriate life insurance coverage.

## 2016-11-14 NOTE — Patient Instructions (Signed)
Take boluses 30-45 min. Before eating Take boluses for all snacks except those that are used to treat low blood sugars. Call if questions.

## 2016-11-14 NOTE — Progress Notes (Signed)
Patient and his wife are here to start U-500 insulin in his pump.  Settings were changed:  Basal rate: MN: 0.55, 6AM: 0.8, 1PM: 0.85, 9PM: 0.6.  I/C ratio: 25, ISF: 50, target 120-150.  Discussed then need to take his bolus 30-45 min. Before his meals, and to make sure he takes a bolus when eating snacks, especially after supper.  He reported good understanding of this.   Wife is fixing meals, and said she will calculate the carbs eaten and check to make sure he is taking all of his boluses. They had no final questions.

## 2016-11-15 DIAGNOSIS — H26491 Other secondary cataract, right eye: Secondary | ICD-10-CM | POA: Diagnosis not present

## 2016-11-15 DIAGNOSIS — H18411 Arcus senilis, right eye: Secondary | ICD-10-CM | POA: Diagnosis not present

## 2016-11-15 DIAGNOSIS — H26492 Other secondary cataract, left eye: Secondary | ICD-10-CM | POA: Diagnosis not present

## 2016-11-15 DIAGNOSIS — H02839 Dermatochalasis of unspecified eye, unspecified eyelid: Secondary | ICD-10-CM | POA: Diagnosis not present

## 2016-11-16 NOTE — Progress Notes (Signed)
Patient ID: David [name], male   DOB: 08/05/1940, 76 y.o.   MRN: [id number]    Reason for Appointment: Followup for Type 2 Diabetes  History of Present Illness:          Diagnosis: Type 2 diabetes mellitus, date of diagnosis:   1992       Past history:  He was initially treated with metformin and at some point also glipizide. His previous records from out of town are not available and no information is available about his level of control Apparently he was started on insulin in 1994 approximately because of poor control He has been on various insulin regimens over the last several years However even with insulin he has had poor control for at least the last 7 or 8 years. He does not know what his previous A1c levels have been. He had been continued on metformin and glipizide but metformin stopped about 2 years ago because of kidney function abnormality He had been taking Lantus 60 units twice a day with NovoLog previously and also Before his initial consultation in 6/15 he was on NovoLog twice a day and Humalog mix insulin  Because of poor control and large insulin doses he was started on Victoza in 6/15  Recent history:   INSULIN PUMP  regimen is as follows Basal rate: MN: 0.55, 6AM: 0.8, 1PM: 0.85, 9PM: 0.6.  I/C ratio: 25, ISF: 50, target 120-150.  Previous settings on U-100 insulin: Basal rates  3.0 at midnight, 3.8 at 6 AM, 3.9 at 1 PM and 3.0 at 10 PM Carbohydrate coverage 1:5 until 12 noon, 1:7 until 6 PM, 1:5 at 6 PM, correction 1:10 with target 120-150  Since 04/28/14 he has been on a [employer] insulin pump because of persistent poor control and high insulin requirement  Initially with the pump his blood sugars had been significantly better but subsequently blood sugars have been again high with A1c mostly over 8% A1c recently higher  at 9.1  Current management, problems and blood sugar patterns:  Because of poor control with the normal insulin and using his  pump he was switched to the U-500 insulin: 11/02/16  His settings were changed by the nurse educator  He has finally started checking his blood sugars more frequently compared to previous visits  However has not done readings after meals at night  He is probably watching his carbohydrate somewhat better and his weight is slightly improved  Also appears to be more consistent with taking boluses for all his meals, not clear if he is bolusing for all snacks at night  FASTING blood sugars are overall significantly better, previously averaging about 280  However he does have fluctuation in blood sugars at all times with occasional normal readings in the 70s but not in the last few days except once at suppertime  Most of his blood sugars are in the 200 range and HIGHEST around midday and early afternoon  Blood sugars are relatively better around suppertime but not consistently  No hypoglycemia reported  Although he has been told to change his settings in the pump himself he is not comfortable doing this himself today  He is doing 1.8 mg Victoza also as before       Oral hypoglycemic drugs the patient is taking are: None, has renal dysfunction        Glucose monitoring:  done about 3 times a day         Glucometer:  contour  Blood Glucose readings from pump download:  Mean values apply above for all meters except median for One Touch  PRE-MEAL Fasting Lunch Dinner Bedtime Overall  Glucose range: 72-233  20 1-317   77-241     Mean/median: 197 240  201  256  216   POST-MEAL PC Breakfast PC Lunch PC Dinner  Glucose range:     Mean/median:  1 78-269        Hypoglycemia: None   Self-care:   Meals: 2- 3 meals per day. May skip breakfast otherwise breakfast is either cereal or eggs and toast, lunch is usually a sandwich at 1 pm , dinner at  6-7 pm.    He thinks his portions are small but periodically eats sweets.   Bedtime snack is usually crackers Or fruit like peaches from  cans          Exercise:  Walking a little recently   Dietician visit: Most recent: several years ago.       Wt Readings from Last 3 Encounters:  11/17/16 278 lb 12.8 oz (126.5 kg)  10/31/16 281 lb 9.6 oz (127.7 kg)  10/25/16 281 lb (127.5 kg)   Glycemic control:   Lab Results  Component Value Date   HGBA1C 9.1 (H) 10/07/2016   HGBA1C 8.1 (H) 05/30/2016   HGBA1C 7.5 03/23/2016   Lab Results  Component Value Date   MICROALBUR 7.7 (H) 10/07/2016   LDLCALC 49 10/29/2013   CREATININE 1.90 (H) 10/07/2016            Lab Results  Component Value Date   FRUCTOSAMINE 328 (H) 08/05/2016   FRUCTOSAMINE 326 (H) 01/30/2014     Other active problems: See review of systems   Allergies as of 11/17/2016   No Known Allergies     Medication List       Accurate as of 11/17/16  2:00 PM. Always use your most recent med list.          ACCU-CHEK SOFTCLIX LANCETS lancets USE AS INSTRUCTED TO CHECK BLOOD SUGAR 6 TIMES PER DAY Dx: Code E11.9   aspirin EC 81 MG tablet Take 81 mg by mouth daily.   calcitRIOL 0.25 MCG capsule Commonly known as:  ROCALTROL Take 0.25 mcg by mouth daily.   carvedilol 25 MG tablet Commonly known as:  COREG Take 1 tablet (25 mg total) by mouth 2 (two) times daily with a meal.   doxazosin 4 MG tablet Commonly known as:  CARDURA TAKE ONE TABLET BY MOUTH ONCE DAILY   Fish Oil 1000 MG Caps Take by mouth 2 (two) times daily.   glucose blood test strip Commonly known as:  BAYER CONTOUR NEXT TEST Use to test blood sugar 4 times daily    Dx code is E11.49   insulin aspart 100 UNIT/ML injection Commonly known as:  NOVOLOG USE MAX 120 UNITS PER DAY WITH CONTINUOUS INSULIN PUMP DX CODE E11.65   insulin regular human CONCENTRATED 500 UNIT/ML injection Commonly known as:  HUMULIN R Use 0.25 mL daily or as directed in insulin pump, Dx code E11.65   isosorbide mononitrate 30 MG 24 hr tablet Commonly known as:  IMDUR TAKE TWO TABLETS BY MOUTH ONCE DAILY     liraglutide 18 MG/3ML Sopn Commonly known as:  VICTOZA INJECT 0.3MLS (1.8 MG TOTAL) INTO THE SKIN DAILY   lisinopril 5 MG tablet Commonly known as:  PRINIVIL,ZESTRIL Take 5 mg by mouth daily.   pantoprazole 40 MG tablet Commonly known as:  PROTONIX TAKE  ONE TABLET BY MOUTH ONCE DAILY   sertraline 50 MG tablet Commonly known as:  ZOLOFT TAKE ONE TABLET BY MOUTH ONCE DAILY   torsemide 20 MG tablet Commonly known as:  DEMADEX Take 1 tablets (20 mg) by mouth in the am and 1 tablet (20 mg) in the pm       Allergies: No Known Allergies  Past Medical History:  Diagnosis Date  . Anemia, iron deficiency   . Anxiety   . Arthritis   . BPH (benign prostatic hypertrophy)   . CAD (coronary artery disease)    Nonobstructive CAD per cath  . Cardiac pacemaker in situ   . CHF (congestive heart failure) ([place])   . Chronic ulcer of right foot ([place])   . 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 Dyspnea   . History of cellulitis    right great toe 10-25-2014  . History of skin cancer   . HOCM (hypertrophic obstructive cardiomyopathy) ([place])   . Hypertension   . Insulin dependent type 2 diabetes mellitus ([place]) 1991   followd by [name]--  has insulin pump  . Insulin pump in place   . OSA on CPAP   . Peripheral neuropathy    severe  . Peripheral vascular disease ([place])    bilateral lower extremities  . Secondary hyperparathyroidism of renal origin ([place])   . Sinus node dysfunction [facility])     Past Surgical History:  Procedure Laterality Date  . AMPUTATION OF REPLICATED TOES  Mar 5462   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)  . AMPUTATION TOE Right 03/12/2015   Procedure: RIGHT HALLUS AMPUTATION ;  Surgeon: [name], MD;  Location: [facility];  Service: Podiatry;  Laterality: Right;  . CARDIAC CATHETERIZATION  11-25-2010   [place], [place]   Nonobstructive CAD  . CARDIAC PACEMAKER PLACEMENT  Nov 2009   [employer]tronic  .  EP IMPLANTABLE DEVICE N/A 06/03/2015   Procedure: PPM Generator Changeout;  Surgeon: [name], MD;  Location: [facility];  Service: Cardiovascular;  Laterality: N/A;  . EXCISION BONE CYST Right 03/06/2015   Procedure: BONE BIOPSIES OF RIGHT FOOT;  Surgeon: [name], MD;  Location: [facility];  Service: Podiatry;  Laterality: Right;  . ORIF ANKLE FRACTURE Left 11/06/2014   Procedure: OPEN REDUCTION INTERNAL FIXATION (ORIF) LEFT  ANKLE FRACTURE;  Surgeon: [name], MD;  Location: [facility];  Service: Orthopedics;  Laterality: Left;  . TOTAL KNEE ARTHROPLASTY    . VEIN LIGATION AND STRIPPING      Family History  Problem Relation Age of Onset  . Cancer Mother        breast  . Heart attack Father     Social History:  reports that he quit smoking about 32 years ago. He has a 60.00 pack-year smoking history. He has quit using smokeless tobacco. He reports that he drinks about 0.6 oz of alcohol per week . He reports that he does not use drugs.    Review of Systems         Lipids: Had been on a Lipitor since late 2014. Also had taken Gembrozil for several years without apparent side effects.  Both of these were  stopped because of increased liver functions and LFT has been back to normal LDL has not been high subsequently but HDL is consistently low  He is taking  fish oil and LDL is within target but  triglycerides are Persistently high       Lab Results  Component Value Date   CHOL 152 08/05/2016   HDL 19.90 (L) 08/05/2016   LDLCALC 49 10/29/2013   LDLDIRECT 77.0 08/05/2016   TRIG 243.0 (H) 08/05/2016   CHOLHDL 8 08/05/2016      HYPERTENSION: The blood pressure has been treated with various drugs.   Currently taking carvedilol, lisinopril 5 mg and doxazosin 4 mg Twice a day  Chronic kidney disease: Etiology unknown, is followed by nephrologist  Has diuretic was increased Last creatinine:  Lab Results  Component Value Date    CREATININE 1.90 (H) 10/07/2016        Has history of Numbness in his feet  since about 2013, no tingling or burning in feet    He has been wearing diabetic shoes.  He has been followed by podiatrist Including in March  He is  under the care of a podiatrist for Regular care And still has had a small ulcer on the left toe   His wife says that he is having memory problems now    Physical Examination:  BP 130/68   Pulse 68   Ht 6\' 4"  (1.93 m)   Wt 278 lb 12.8 oz (126.5 kg)   SpO2 97%   BMI 33.94 kg/m     ASSESSMENT:  Diabetes type 2, uncontrolled with obesity See history of present illness for detailed discussion of current diabetes management, blood sugar patterns and problems identified   His last A1c was higher at 9.1  His blood sugars are overall Improving especially overnight with starting the Humulin R U-500 instead of U-100 Overall his insulin dose with his basal insulin is now a little less Not clear why blood sugars are rising significantly midday and afternoon Also appears to have more postprandial hyperglycemia at least in the mornings Although his wife is helping him he still inconsistent with restricting snacks and carbohydrates at times at night This may be causing variable overnight blood sugars but only rarely they are normal As discussed above he needs the highest basal rate in the morning hours until early afternoon even with the concentrated insulin  PLAN:   Reminded him to bolus 30 minutes before eating More consistent blood sugars after supper He needs to check blood sugars 4 times a day and also he can qualify for the continuous glucose monitoring which was discussed briefly  His pump settings were changed in the office today BASAL rates:Midnight = 0.6, 6 AM = 1.0, 3 PM +0.85 and 9 PM 0.7 Carbohydrate Coverage 1:20 and Correction 1:45 Active insulin time = 6 hours  Hypertension: Blood pressure is controlled   Total visit time for evaluation and  management of diabetes, downloading insulin pump, review of blood sugar patterns, reprogramming insulin pump, analysis of day-to-day management and counseling = 25 minutes  There are no Patient Instructions on file for this visit.  David [name] 11/17/2016, 2:00 PM   Note: This office note was prepared with Estate agent. Any transcriptional errors that result from this process are unintentional.

## 2016-11-17 ENCOUNTER — Other Ambulatory Visit: Payer: Self-pay

## 2016-11-17 ENCOUNTER — Encounter: Payer: Self-pay | Admitting: Endocrinology

## 2016-11-17 ENCOUNTER — Ambulatory Visit (INDEPENDENT_AMBULATORY_CARE_PROVIDER_SITE_OTHER): Payer: Medicare Other | Admitting: Endocrinology

## 2016-11-17 VITALS — BP 130/68 | HR 68 | Ht 76.0 in | Wt 278.8 lb

## 2016-11-17 DIAGNOSIS — IMO0001 Reserved for inherently not codable concepts without codable children: Secondary | ICD-10-CM

## 2016-11-17 DIAGNOSIS — E1165 Type 2 diabetes mellitus with hyperglycemia: Secondary | ICD-10-CM | POA: Diagnosis not present

## 2016-11-17 DIAGNOSIS — E669 Obesity, unspecified: Secondary | ICD-10-CM

## 2016-11-17 DIAGNOSIS — Z794 Long term (current) use of insulin: Secondary | ICD-10-CM

## 2016-11-17 MED ORDER — GLUCOSE BLOOD VI STRP: ORAL_STRIP | 3 refills | Status: DC

## 2016-11-18 LAB — CUP PACEART INCLINIC DEVICE CHECK
Battery Voltage: 3.01 V
Brady Statistic RA Percent Paced: 99.9 %
Brady Statistic RV Percent Paced: 0.1 % — CL
Date Time Interrogation Session: 20180706165013
Implantable Lead Implant Date: 20091102
Implantable Lead Implant Date: 20091102
Implantable Lead Location: 753859
Implantable Lead Location: 753860
Implantable Lead Model: 5076
Implantable Lead Model: 5076
Implantable Pulse Generator Implant Date: 20170118
Lead Channel Impedance Value: 399 Ohm
Lead Channel Impedance Value: 608 Ohm
Lead Channel Pacing Threshold Amplitude: 1.25 V
Lead Channel Pacing Threshold Amplitude: 2 V
Lead Channel Pacing Threshold Pulse Width: 0.4 ms
Lead Channel Pacing Threshold Pulse Width: 0.4 ms
Lead Channel Sensing Intrinsic Amplitude: 1.4 mV
Lead Channel Sensing Intrinsic Amplitude: 20 mV
Lead Channel Setting Pacing Amplitude: 2.5 V
Lead Channel Setting Pacing Amplitude: 2.5 V
Lead Channel Setting Pacing Pulse Width: 0.4 ms
Lead Channel Setting Sensing Sensitivity: 2.8 mV

## 2016-11-19 ENCOUNTER — Other Ambulatory Visit: Payer: Self-pay | Admitting: Endocrinology

## 2016-11-21 ENCOUNTER — Other Ambulatory Visit: Payer: Self-pay

## 2016-11-21 MED ORDER — INSULIN ASPART 100 UNIT/ML ~~LOC~~ SOLN: SUBCUTANEOUS | 3 refills | Status: DC

## 2016-11-22 ENCOUNTER — Ambulatory Visit (INDEPENDENT_AMBULATORY_CARE_PROVIDER_SITE_OTHER): Payer: Medicare Other | Admitting: *Deleted

## 2016-11-22 DIAGNOSIS — I495 Sick sinus syndrome: Secondary | ICD-10-CM

## 2016-11-22 NOTE — Progress Notes (Signed)
Remote pacemaker transmission.   

## 2016-11-23 DIAGNOSIS — Z9842 Cataract extraction status, left eye: Secondary | ICD-10-CM | POA: Diagnosis not present

## 2016-11-24 LAB — CUP PACEART REMOTE DEVICE CHECK
Battery Remaining Longevity: 82 mo
Battery Voltage: 3 V
Brady Statistic AP VP Percent: 0.04 %
Brady Statistic AP VS Percent: 99.91 %
Brady Statistic AS VP Percent: 0 %
Brady Statistic AS VS Percent: 0.04 %
Brady Statistic RA Percent Paced: 99.95 %
Brady Statistic RV Percent Paced: 0.05 %
Date Time Interrogation Session: 20180710160035
Implantable Lead Implant Date: 20091102
Implantable Lead Implant Date: 20091102
Implantable Lead Location: 753859
Implantable Lead Location: 753860
Implantable Lead Model: 5076
Implantable Lead Model: 5076
Implantable Pulse Generator Implant Date: 20170118
Lead Channel Impedance Value: 418 Ohm
Lead Channel Impedance Value: 437 Ohm
Lead Channel Impedance Value: 551 Ohm
Lead Channel Impedance Value: 627 Ohm
Lead Channel Pacing Threshold Amplitude: 0.875 V
Lead Channel Pacing Threshold Amplitude: 2.375 V
Lead Channel Pacing Threshold Pulse Width: 0.4 ms
Lead Channel Pacing Threshold Pulse Width: 0.4 ms
Lead Channel Sensing Intrinsic Amplitude: 1.25 mV
Lead Channel Sensing Intrinsic Amplitude: 1.25 mV
Lead Channel Sensing Intrinsic Amplitude: 21.375 mV
Lead Channel Sensing Intrinsic Amplitude: 21.375 mV
Lead Channel Setting Pacing Amplitude: 2.5 V
Lead Channel Setting Pacing Amplitude: 2.5 V
Lead Channel Setting Pacing Pulse Width: 0.4 ms
Lead Channel Setting Sensing Sensitivity: 2.8 mV

## 2016-11-28 ENCOUNTER — Encounter: Payer: Self-pay | Admitting: Cardiology

## 2016-12-06 ENCOUNTER — Ambulatory Visit (INDEPENDENT_AMBULATORY_CARE_PROVIDER_SITE_OTHER): Payer: Medicare Other | Admitting: Sports Medicine

## 2016-12-06 ENCOUNTER — Encounter: Payer: Self-pay | Admitting: Sports Medicine

## 2016-12-06 DIAGNOSIS — Z899 Acquired absence of limb, unspecified: Secondary | ICD-10-CM

## 2016-12-06 DIAGNOSIS — M2042 Other hammer toe(s) (acquired), left foot: Secondary | ICD-10-CM

## 2016-12-06 DIAGNOSIS — E0842 Diabetes mellitus due to underlying condition with diabetic polyneuropathy: Secondary | ICD-10-CM

## 2016-12-06 DIAGNOSIS — L97521 Non-pressure chronic ulcer of other part of left foot limited to breakdown of skin: Secondary | ICD-10-CM

## 2016-12-06 NOTE — Progress Notes (Addendum)
Subjective: David [name] is a 76 y.o. male patient seen in office for follow up evaluation of ulceration of the left 3rd toe. Patient has a history of diabetes and a blood glucose of 152 today. Patient is changing the dressing using PRISMA at home with help from wife. Denies nausea/fever/vomiting/chills/night sweats/shortness of breath/pain. Patient has no other pedal complaints at this time.  Patient Active Problem List   Diagnosis Date Noted  . 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 Otitis, externa, infective 03/11/2016  . Rib fracture 07/24/2015  . Syncope 07/24/2015  . 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 Osteomyelitis of foot, right, acute ([place]) 05/04/2015  . Ankle fracture, left 10/29/2014  . OSA on CPAP 10/26/2014  . Back pain 10/16/2014  . Left leg numbness 10/16/2014  . Fatigue 10/16/2014  . Impacted cerumen of both ears 10/16/2014  . Episodic lightheadedness 08/04/2014  . Open toe wound 07/18/2014  . Diabetes mellitus with neurological manifestations, uncontrolled ([place]) 02/27/2014  . Mixed hyperlipidemia 02/27/2014  . Seasonal and perennial allergic rhinitis 10/25/2013  . Essential hypertension 10/25/2013  . Pure hypercholesterolemia 10/25/2013  . Obesity 10/25/2013  . Renal insufficiency 10/25/2013   Current Outpatient Prescriptions on File Prior to Visit  Medication Sig Dispense Refill  . ACCU-CHEK SOFTCLIX LANCETS lancets USE AS INSTRUCTED TO CHECK BLOOD SUGAR 6 TIMES PER DAY Dx: Code E11.9 300 each 2  . aspirin EC 81 MG tablet Take 81 mg by mouth daily.    . calcitRIOL (ROCALTROL) 0.25 MCG capsule Take 0.25 mcg by mouth daily.     . carvedilol (COREG) 25 MG tablet Take 1 tablet (25 mg total) by mouth 2 (two) times daily with a meal. 90 tablet 1  . doxazosin (CARDURA) 4 MG tablet TAKE ONE TABLET BY MOUTH ONCE DAILY 90 tablet 0  . glucose blood (BAYER CONTOUR NEXT TEST) test strip Use to test blood sugar 4 times daily    Dx code is  E11.49 150 each 3  . insulin aspart (NOVOLOG) 100 UNIT/ML injection USE MAX 120 UNITS PER DAY WITH CONTINUOUS INSULIN PUMP DX CODE E11.65 60 mL 3  . insulin regular human CONCENTRATED (HUMULIN R) 500 UNIT/ML injection Use 0.25 mL daily or as directed in insulin pump, Dx code E11.65 (Patient taking differently: Use max 120 units daily or as directed in insulin pump, Dx code E11.65) 20 mL 1  . isosorbide mononitrate (IMDUR) 30 MG 24 hr tablet TAKE TWO TABLETS BY MOUTH ONCE DAILY 60 tablet 11  . liraglutide (VICTOZA) 18 MG/3ML SOPN INJECT 0.3MLS (1.8 MG TOTAL) INTO THE SKIN DAILY 9 pen 1  . lisinopril (PRINIVIL,ZESTRIL) 5 MG tablet Take 5 mg by mouth daily.    Marland Kitchen NOVOLOG 100 UNIT/ML injection USE MAX 120 UNITS PER DAY WITH INSULIN PUMP 40 mL 3  . Omega-3 Fatty Acids (FISH OIL) 1000 MG CAPS Take by mouth 2 (two) times daily.    . pantoprazole (PROTONIX) 40 MG tablet TAKE ONE TABLET BY MOUTH ONCE DAILY 90 tablet 1  . sertraline (ZOLOFT) 50 MG tablet TAKE ONE TABLET BY MOUTH ONCE DAILY 90 tablet 2  . torsemide (DEMADEX) 20 MG tablet Take 1 tablets (20 mg) by mouth in the am and 1 tablet (20 mg) in the pm     No current facility-administered medications on file prior to visit.    No Known Allergies  Recent Results (from the past 2160 hour(s))  HM DIABETES EYE EXAM     Status:  Abnormal   Collection Time: 09/23/16 12:00 AM  Result Value Ref Range   HM Diabetic Eye Exam Retinopathy (A) No Retinopathy  HM DIABETES EYE EXAM     Status: Abnormal   Collection Time: 09/23/16 12:00 AM  Result Value Ref Range   HM Diabetic Eye Exam Retinopathy (A) No Retinopathy  CBG monitoring, ED     Status: Abnormal   Collection Time: 09/26/16  5:18 PM  Result Value Ref Range   Glucose-Capillary 212 (H) 65 - 99 mg/dL   Comment 1 Notify RN    Comment 2 Document in Chart   CBC WITH DIFFERENTIAL     Status: Abnormal   Collection Time: 09/26/16  6:31 PM  Result Value Ref Range   WBC 8.9 4.0 - 10.5 K/uL   RBC 3.92 (L)  4.22 - 5.81 MIL/uL   Hemoglobin 11.7 (L) 13.0 - 17.0 g/dL   HCT 34.8 (L) 39.0 - 52.0 %   MCV 88.8 78.0 - 100.0 fL   MCH 29.8 26.0 - 34.0 pg   MCHC 33.6 30.0 - 36.0 g/dL   RDW 14.1 11.5 - 15.5 %   Platelets 165 150 - 400 K/uL   Neutrophils Relative % 77 %   Neutro Abs 6.8 1.7 - 7.7 K/uL   Lymphocytes Relative 13 %   Lymphs Abs 1.2 0.7 - 4.0 K/uL   Monocytes Relative 8 %   Monocytes Absolute 0.7 0.1 - 1.0 K/uL   Eosinophils Relative 2 %   Eosinophils Absolute 0.2 0.0 - 0.7 K/uL   Basophils Relative 0 %   Basophils Absolute 0.0 0.0 - 0.1 K/uL  Comprehensive metabolic panel     Status: Abnormal   Collection Time: 09/26/16  6:31 PM  Result Value Ref Range   Sodium 134 (L) 135 - 145 mmol/L   Potassium 4.8 3.5 - 5.1 mmol/L   Chloride 104 101 - 111 mmol/L   CO2 21 (L) 22 - 32 mmol/L   Glucose, Bld 197 (H) 65 - 99 mg/dL   BUN 42 (H) 6 - 20 mg/dL   Creatinine, Ser 1.92 (H) 0.61 - 1.24 mg/dL   Calcium 9.4 8.9 - 10.3 mg/dL   Total Protein 6.9 6.5 - 8.1 g/dL   Albumin 3.8 3.5 - 5.0 g/dL   AST 18 15 - 41 U/L   ALT 19 17 - 63 U/L   Alkaline Phosphatase 84 38 - 126 U/L   Total Bilirubin 0.7 0.3 - 1.2 mg/dL   GFR calc non Af Amer 32 (L) >60 mL/min   GFR calc Af Amer 37 (L) >60 mL/min    Comment: (NOTE) The eGFR has been calculated using the CKD EPI equation. This calculation has not been validated in all clinical situations. eGFR's persistently <60 mL/min signify possible Chronic Kidney Disease.    Anion gap 9 5 - 15  Protime-INR     Status: None   Collection Time: 09/26/16  6:31 PM  Result Value Ref Range   Prothrombin Time 14.3 11.4 - 15.2 seconds   INR 1.10   I-stat troponin, ED     Status: None   Collection Time: 09/26/16  6:59 PM  Result Value Ref Range   Troponin i, poc 0.07 0.00 - 0.08 ng/mL   Comment 3            Comment: Due to the release kinetics of cTnI, a negative result within the first hours of the onset of symptoms does not rule out myocardial infarction with  certainty. If  myocardial infarction is still suspected, repeat the test at appropriate intervals.   I-Stat venous blood gas, ED     Status: Abnormal   Collection Time: 09/26/16  7:00 PM  Result Value Ref Range   pH, Ven 7.292 7.250 - 7.430   pCO2, Ven 43.8 (L) 44.0 - 60.0 mmHg   pO2, Ven 39.0 32.0 - 45.0 mmHg   Bicarbonate 21.2 20.0 - 28.0 mmol/L   TCO2 22 0 - 100 mmol/L   O2 Saturation 67.0 %   Acid-base deficit 5.0 (H) 0.0 - 2.0 mmol/L   Patient temperature HIDE    Sample type VENOUS   I-stat chem 8, ED     Status: Abnormal   Collection Time: 09/26/16  7:02 PM  Result Value Ref Range   Sodium 137 135 - 145 mmol/L   Potassium 4.8 3.5 - 5.1 mmol/L   Chloride 104 101 - 111 mmol/L   BUN 40 (H) 6 - 20 mg/dL   Creatinine, Ser 2.00 (H) 0.61 - 1.24 mg/dL   Glucose, Bld 200 (H) 65 - 99 mg/dL   Calcium, Ion 1.24 1.15 - 1.40 mmol/L   TCO2 23 0 - 100 mmol/L   Hemoglobin 11.6 (L) 13.0 - 17.0 g/dL   HCT 34.0 (L) 39.0 - 52.0 %  Urinalysis, Complete w Microscopic     Status: Abnormal   Collection Time: 09/26/16  9:05 PM  Result Value Ref Range   Color, Urine YELLOW YELLOW   APPearance CLEAR CLEAR   Specific Gravity, Urine 1.009 1.005 - 1.030   pH 5.0 5.0 - 8.0   Glucose, UA 150 (A) NEGATIVE mg/dL   Hgb urine dipstick NEGATIVE NEGATIVE   Bilirubin Urine NEGATIVE NEGATIVE   Ketones, ur NEGATIVE NEGATIVE mg/dL   Protein, ur NEGATIVE NEGATIVE mg/dL   Nitrite NEGATIVE NEGATIVE   Leukocytes, UA NEGATIVE NEGATIVE   RBC / HPF NONE SEEN 0 - 5 RBC/hpf   WBC, UA 0-5 0 - 5 WBC/hpf   Bacteria, UA RARE (A) NONE SEEN   Squamous Epithelial / LPF 0-5 (A) NONE SEEN   Mucous PRESENT    Hyaline Casts, UA PRESENT   Hemoglobin A1c     Status: Abnormal   Collection Time: 10/07/16  1:34 PM  Result Value Ref Range   Hgb A1c MFr Bld 9.1 (H) 4.6 - 6.5 %    Comment: Glycemic Control Guidelines for People with Diabetes:Non Diabetic:  <6%Goal of Therapy: <7%Additional Action Suggested:  >7%   Basic  metabolic panel     Status: Abnormal   Collection Time: 10/07/16  1:34 PM  Result Value Ref Range   Sodium 138 135 - 145 mEq/L   Potassium 4.9 3.5 - 5.1 mEq/L   Chloride 110 96 - 112 mEq/L   CO2 22 19 - 32 mEq/L   Glucose, Bld 175 (H) 70 - 99 mg/dL   BUN 40 (H) 6 - 23 mg/dL   Creatinine, Ser 1.90 (H) 0.40 - 1.50 mg/dL   Calcium 9.2 8.4 - 10.5 mg/dL   GFR 36.80 (L) >60.00 mL/min  Microalbumin / creatinine urine ratio     Status: Abnormal   Collection Time: 10/07/16  1:34 PM  Result Value Ref Range   Microalb, Ur 7.7 (H) 0.0 - 1.9 mg/dL   Creatinine,U 69.6 mg/dL   Microalb Creat Ratio 11.1 0.0 - 30.0 mg/g  CUP PACEART INCLINIC DEVICE CHECK     Status: Abnormal   Collection Time: 11/18/16  8:46 PM  Result Value Ref Range  Pulse Generator Manufacturer MERM    Date Time Interrogation Session [id number]    Pulse Gen Model [id number] Advisa DR MRI    Pulse Gen Serial Number [id number]    Clinic Name [facility]    Implantable Pulse Generator Type Implantable Pulse Generator    Implantable Pulse Generator Implant Date 25956387    Implantable Lead Manufacturer MERM    Implantable Lead Model 5076 CapSureFix Novus    Implantable Lead Serial Number [id number]    Implantable Lead Implant Date 84166063    Implantable Lead Location Detail 1 UNKNOWN    Implantable Lead Location [id number]    Implantable Lead Manufacturer MERM    Implantable Lead Model 5076 CapSureFix Novus    Implantable Lead Serial Number [id number]    Implantable Lead Implant Date 01601093    Implantable Lead Location Detail 1 UNKNOWN    Implantable Lead Location [id number]    Lead Channel Setting Sensing Sensitivity 2.8 mV   Lead Channel Setting Pacing Amplitude 2.5 V   Lead Channel Setting Pacing Pulse Width 0.4 ms   Lead Channel Setting Pacing Amplitude 2.5 V   Lead Channel Impedance Value 399 ohm   Lead Channel Sensing Intrinsic Amplitude 1.4 mV   Lead Channel Pacing Threshold Amplitude 2 V   Lead Channel Pacing  Threshold Pulse Width 0.4 ms   Lead Channel Impedance Value 608 ohm   Lead Channel Sensing Intrinsic Amplitude 20 (>) mV   Lead Channel Pacing Threshold Amplitude 1.25 V   Lead Channel Pacing Threshold Pulse Width 0.4 ms   Battery Voltage 3.01 V   Brady Statistic RA Percent Paced 99.9 %   Brady Statistic RV Percent Paced 0.1 (<) %   Eval Rhythm AS,VS @ 45   CUP PACEART REMOTE DEVICE CHECK     Status: None   Collection Time: 11/22/16  4:00 PM  Result Value Ref Range   Date Time Interrogation Session [id number]    Pulse Generator Manufacturer MERM    Pulse Gen Model [id number] Advisa DR MRI    Pulse Gen Serial Number [id number]    Clinic Name [facility]    Implantable Pulse Generator Type Implantable Pulse Generator    Implantable Pulse Generator Implant Date 15176160    Implantable Lead Manufacturer MERM    Implantable Lead Model 5076 CapSureFix Novus    Implantable Lead Serial Number [id number]    Implantable Lead Implant Date 73710626    Implantable Lead Location Detail 1 UNKNOWN    Implantable Lead Location [id number]    Implantable Lead Manufacturer MERM    Implantable Lead Model 5076 CapSureFix Novus    Implantable Lead Serial Number [id number]    Implantable Lead Implant Date 50093818    Implantable Lead Location Detail 1 UNKNOWN    Implantable Lead Location [phone]    Lead Channel Setting Sensing Sensitivity 2.8 mV   Lead Channel Setting Pacing Amplitude 2.5 V   Lead Channel Setting Pacing Pulse Width 0.4 ms   Lead Channel Setting Pacing Amplitude 2.5 V   Lead Channel Impedance Value 551 ohm   Lead Channel Impedance Value 418 ohm   Lead Channel Sensing Intrinsic Amplitude 1.25 mV   Lead Channel Sensing Intrinsic Amplitude 1.25 mV   Lead Channel Pacing Threshold Amplitude 2.375 V   Lead Channel Pacing Threshold Pulse Width 0.4 ms   Lead Channel Impedance Value 627 ohm   Lead Channel Impedance Value 437 ohm   Lead Channel Sensing Intrinsic Amplitude 21.375 mV   Lead  Channel Sensing Intrinsic Amplitude  21.375 mV   Lead Channel Pacing Threshold Amplitude 0.875 V   Lead Channel Pacing Threshold Pulse Width 0.4 ms   Battery Status OK    Battery Remaining Longevity 82 mo   Battery Voltage 3.00 V   Brady Statistic RA Percent Paced 99.95 %   Brady Statistic RV Percent Paced 0.05 %   Brady Statistic AP VP Percent 0.04 %   Brady Statistic AS VP Percent 0 %   Brady Statistic AP VS Percent 99.91 %   Brady Statistic AS VS Percent 0.04 %   Eval Rhythm Ap/Vs     Objective: There were no vitals filed for this visit.  General: Patient is awake, alert, oriented x 3 and in no acute distress.  Dermatology: Skin is warm and dry bilateral with a ulceration present left 3rd toe distal tuft. Ulceration measures 0.5cm x 0.9 cm x 0.1cm, larger than previous, not improving in size. There is a keratotic border with a granular base. The ulceration does not probe to bone. There is no malodor, no active drainage, no erythema, no edema. No acute signs of infection. Nails are short, thick, and mycotic.    Vascular: Dorsalis Pedis pulse = 1/4 Bilateral,  Posterior Tibial pulse = 1/4 Bilateral,  Capillary Fill Time < 5 seconds  Neurologic: Protective sensation absent to the level of knee using  the 5.07/10g [employer].  Musculosketal:  No Pain with palpation to ulcerated area. No pain with compression to calves bilateral. Hammertoe and history of toe amps on right.  Assessment and Plan:  Problem List Items Addressed This Visit    None    Visit Diagnoses    Toe ulcer, left, limited to breakdown of skin ([place])    -  Primary   Hammertoe of left foot       Diabetic polyneuropathy associated with diabetes mellitus due to underlying condition ([place])       History of amputation          -Complete examination performed -Discussed the progression of the wound and treatment alternatives. - Excisionally dedbrided ulceration at left 3rd toe to healthy bleeding  borders using a sterile chisel  blade. -Applied PRISMA and bandaid dressing and instructed patient to continue with daily dressings at home consisting of the same; will request EPIFIX to assist with wound healing since patient is high risk diabetic with amputation history - Advised patient to go to the ER or return to office if the wound worsens or if constitutional symptoms are present. -Continue with diabetic shoes  -Patient to return to office in 3 weeks for follow up care and evaluation or sooner if problems arise.  Landis Martins, DPM

## 2016-12-07 ENCOUNTER — Telehealth: Payer: Self-pay | Admitting: *Deleted

## 2016-12-07 NOTE — Telephone Encounter (Addendum)
-----   Message from Landis Martins, [place] sent at 12/06/2016 11:31 AM EDT ----- Regarding: Epifix  Epifix 24 mm disk for toe ulcer. 12/07/2016-Faxed required form and last 4 OV and demographics to [employer].07//26/2018-Received [employer] fax stating EpiFix not covered due to wound size, and faxed to Dr. Cannon Kettle for further instructions.

## 2016-12-08 NOTE — Telephone Encounter (Addendum)
-----   Message from Landis Martins, [place] sent at 12/08/2016 12:46 PM EDT ----- Can you let patient know that it's not covered and order Regranex for the patient to use to the toe instead Thanks Dr. Cannon Kettle. I spoke with Mrs. Ridolfi and informed of Dr. Leeanne Rio new orders. Required form, clinicals and demographics faxed to [facility] [facility].12/09/2016-Jazmine - [employer] [facility] states needs more insurance information. 12/12/2016-I spoke with Roena Malady - [employer] [facility], gave her insurance information and she states would have more information from pt's pharmacy. I gave her [facility] [phone].

## 2016-12-12 ENCOUNTER — Encounter: Payer: Self-pay | Admitting: Cardiology

## 2016-12-13 ENCOUNTER — Encounter: Payer: Self-pay | Admitting: Endocrinology

## 2016-12-21 ENCOUNTER — Ambulatory Visit: Payer: Medicare Other | Admitting: Endocrinology

## 2016-12-23 ENCOUNTER — Ambulatory Visit (INDEPENDENT_AMBULATORY_CARE_PROVIDER_SITE_OTHER): Payer: Medicare Other | Admitting: Endocrinology

## 2016-12-23 ENCOUNTER — Encounter: Payer: Self-pay | Admitting: Endocrinology

## 2016-12-23 VITALS — BP 120/60 | HR 61 | Ht 76.0 in | Wt 285.0 lb

## 2016-12-23 DIAGNOSIS — I10 Essential (primary) hypertension: Secondary | ICD-10-CM

## 2016-12-23 DIAGNOSIS — Z794 Long term (current) use of insulin: Secondary | ICD-10-CM

## 2016-12-23 DIAGNOSIS — E1165 Type 2 diabetes mellitus with hyperglycemia: Secondary | ICD-10-CM

## 2016-12-23 NOTE — Patient Instructions (Signed)
More sugars at bedtime  Walk daily

## 2016-12-23 NOTE — Progress Notes (Signed)
Patient ID: David [name], male   DOB: 04/13/1941, 76 y.o.   MRN: [id number]    Reason for Appointment: Followup for Type 2 Diabetes  History of Present Illness:          Diagnosis: Type 2 diabetes mellitus, date of diagnosis:   1992       Past history:  He was initially treated with metformin and at some point also glipizide. His previous records from out of town are not available and no information is available about his level of control Apparently he was started on insulin in 1994 approximately because of poor control He has been on various insulin regimens over the last several years However even with insulin he has had poor control for at least the last 7 or 8 years. He does not know what his previous A1c levels have been. He had been continued on metformin and glipizide but metformin stopped about 2 years ago because of kidney function abnormality He had been taking Lantus 60 units twice a day with NovoLog previously and also Before his initial consultation in 6/15 he was on NovoLog twice a day and Humalog mix insulin  Because of poor control and large insulin doses he was started on Victoza in 6/15  Recent history:   INSULIN PUMP  regimen with U-500 insulin is as follows Basal rate: MN: 0.6, 6AM: 1.0, , 3PM: 0.85, 9PM: 0.7.  I/C ratio: 25, ISF: 45, target 120-150.  Previous settings on U-100 insulin: Basal rates  3.0 at midnight, 3.8 at 6 AM, 3.9 at 1 PM and 3.0 at 10 PM Carbohydrate coverage 1:5 until 12 noon, 1:7 until 6 PM, 1:5 at 6 PM, correction 1:10 with target 120-150  Since 04/28/14 he has been on a Medtronic insulin pump because of persistent poor control and high insulin requirement  Initially with the pump his blood sugars had been significantly better but subsequently blood sugars have been again high with A1c mostly over 8% A1c most recently higher  at 9.1  Today his pump download indicates that his pump reprogrammed to about 12 hours slow with a.m.  and p.m. readings backwards  Current management, problems and blood sugar patterns:  His blood sugars are on an average better than before  However overall average blood sugar is generally higher in the morning and suppertime and relatively better around lunchtime recently  Blood sugars are analyzed keeping into account that his pump time was off by 12 hours  Despite having an unusually high basal rate at night with his a.m. and p.m. settings backwards his morning sugars are not getting low and I still relatively high  He has only a couple of readings after meals at night and these are relatively better but difficult to assess otherwise since he does not monitor after meals much  He thinks he may have changed the battery in his pump and because of this the date and time was divorced  His basal rates were increased somewhat on the last visit  Blood sugars around suppertime are  increased but variable especially last 2 weeks  He has been on medication for a week until recently and has gained significant amount of weight probably from poor diet, blood sugars appear to be relatively better in the last week now  Again he is checking only occasional readings after meals, he has 3 readings after evening meal which are fairly good  He is doing 1.8 mg Victoza also as before  Oral hypoglycemic drugs the patient is taking are: None, has renal dysfunction        Glucose monitoring:  done about 3 times a day         Glucometer:  contour     Blood Glucose readings from pump download:  Mean values apply above for all meters except median for One Touch  PRE-MEAL Fasting Lunch Dinner Bedtime Overall  Glucose range:  1 36-259   93-219   1 42-318  135-156    Mean/median:     191+/-52      Hypoglycemia: None   Self-care:   Meals: 2- 3 meals per day. May skip breakfast otherwise breakfast is either cereal or eggs and toast, lunch is usually a sandwich at 1 pm , dinner at  6-7 pm.    He  thinks his portions are small but periodically eats sweets.   Bedtime snack is usually crackers Or fruit like peaches from cans          Exercise:  Walking a little recently   Dietician visit: Most recent: several years ago.       Wt Readings from Last 3 Encounters:  12/23/16 285 lb (129.3 kg)  11/17/16 278 lb 12.8 oz (126.5 kg)  10/31/16 281 lb 9.6 oz (127.7 kg)   Glycemic control:   Lab Results  Component Value Date   HGBA1C 9.1 (H) 10/07/2016   HGBA1C 8.1 (H) 05/30/2016   HGBA1C 7.5 03/23/2016   Lab Results  Component Value Date   MICROALBUR 7.7 (H) 10/07/2016   LDLCALC 49 10/29/2013   CREATININE 1.90 (H) 10/07/2016            Lab Results  Component Value Date   FRUCTOSAMINE 328 (H) 08/05/2016   FRUCTOSAMINE 326 (H) 01/30/2014     Other active problems: See review of systems   Allergies as of 12/23/2016   No Known Allergies     Medication List       Accurate as of 12/23/16 10:35 AM. Always use your most recent med list.          ACCU-CHEK SOFTCLIX LANCETS lancets USE AS INSTRUCTED TO CHECK BLOOD SUGAR 6 TIMES PER DAY Dx: Code E11.9   aspirin EC 81 MG tablet Take 81 mg by mouth daily.   calcitRIOL 0.25 MCG capsule Commonly known as:  ROCALTROL Take 0.25 mcg by mouth daily.   carvedilol 25 MG tablet Commonly known as:  COREG Take 1 tablet (25 mg total) by mouth 2 (two) times daily with a meal.   doxazosin 4 MG tablet Commonly known as:  CARDURA TAKE ONE TABLET BY MOUTH ONCE DAILY   Fish Oil 1000 MG Caps Take by mouth 2 (two) times daily.   glucose blood test strip Commonly known as:  BAYER CONTOUR NEXT TEST Use to test blood sugar 4 times daily    Dx code is E11.49   insulin regular human CONCENTRATED 500 UNIT/ML injection Commonly known as:  HUMULIN R Use 0.25 mL daily or as directed in insulin pump, Dx code E11.65   isosorbide mononitrate 30 MG 24 hr tablet Commonly known as:  IMDUR TAKE TWO TABLETS BY MOUTH ONCE DAILY   liraglutide  18 MG/3ML Sopn Commonly known as:  VICTOZA INJECT 0.3MLS (1.8 MG TOTAL) INTO THE SKIN DAILY   lisinopril 5 MG tablet Commonly known as:  PRINIVIL,ZESTRIL Take 5 mg by mouth daily.   NOVOLOG 100 UNIT/ML injection Generic drug:  insulin aspart USE MAX 120 UNITS PER DAY  WITH INSULIN PUMP   insulin aspart 100 UNIT/ML injection Commonly known as:  NOVOLOG USE MAX 120 UNITS PER DAY WITH CONTINUOUS INSULIN PUMP DX CODE E11.65   pantoprazole 40 MG tablet Commonly known as:  PROTONIX TAKE ONE TABLET BY MOUTH ONCE DAILY   sertraline 50 MG tablet Commonly known as:  ZOLOFT TAKE ONE TABLET BY MOUTH ONCE DAILY   torsemide 20 MG tablet Commonly known as:  DEMADEX Take 1 tablets (20 mg) by mouth in the am and 1 tablet (20 mg) in the pm       Allergies: No Known Allergies  Past Medical History:  Diagnosis Date  . Anemia, iron deficiency   . Anxiety   . Arthritis   . BPH (benign prostatic hypertrophy)   . CAD (coronary artery disease)    Nonobstructive CAD per cath  . Cardiac pacemaker in situ   . CHF (congestive heart failure) ([place])   . Chronic ulcer of right foot ([place])   . 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 Dyspnea   . History of cellulitis    right great toe 10-25-2014  . History of skin cancer   . HOCM (hypertrophic obstructive cardiomyopathy) ([place])   . Hypertension   . Insulin dependent type 2 diabetes mellitus ([place]) 1991   followd by [name]--  has insulin pump  . Insulin pump in place   . OSA on CPAP   . Peripheral neuropathy    severe  . Peripheral vascular disease ([place])    bilateral lower extremities  . Secondary hyperparathyroidism of renal origin ([place])   . Sinus node dysfunction [facility])     Past Surgical History:  Procedure Laterality Date  . AMPUTATION OF REPLICATED TOES  Mar 0177   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)  . AMPUTATION TOE Right 03/12/2015   Procedure: RIGHT HALLUS AMPUTATION ;  Surgeon:  [name], MD;  Location: [place];  Service: Podiatry;  Laterality: Right;  . CARDIAC CATHETERIZATION  11-25-2010   [place], [place]   Nonobstructive CAD  . CARDIAC PACEMAKER PLACEMENT  Nov 2009   Medtronic  . EP IMPLANTABLE DEVICE N/A 06/03/2015   Procedure: PPM Generator Changeout;  Surgeon: [name], MD;  Location: [place] [facility];  Service: Cardiovascular;  Laterality: N/A;  . EXCISION BONE CYST Right 03/06/2015   Procedure: BONE BIOPSIES OF RIGHT FOOT;  Surgeon: [name], MD;  Location: [place];  Service: Podiatry;  Laterality: Right;  . ORIF ANKLE FRACTURE Left 11/06/2014   Procedure: OPEN REDUCTION INTERNAL FIXATION (ORIF) LEFT  ANKLE FRACTURE;  Surgeon: [name], MD;  Location: [facility];  Service: Orthopedics;  Laterality: Left;  . TOTAL KNEE ARTHROPLASTY    . VEIN LIGATION AND STRIPPING      Family History  Problem Relation Age of Onset  . Cancer Mother        breast  . Heart attack Father     Social History:  reports that he quit smoking about 32 years ago. He has a 60.00 pack-year smoking history. He has quit using smokeless tobacco. He reports that he drinks about 0.6 oz of alcohol per week . He reports that he does not use drugs.    Review of Systems   The following is a copy of the previous note      Lipids: Had been on a Lipitor since late 2014. Also had taken Gembrozil for several  years without apparent side effects.  Both of these were  stopped because of increased liver functions and LFT has been back to normal LDL has not been high subsequently but HDL is consistently low  He is taking  fish oil and LDL is within target but triglycerides are Persistently high       Lab Results  Component Value Date   CHOL 152 08/05/2016   HDL 19.90 (L) 08/05/2016   LDLCALC 49 10/29/2013   LDLDIRECT 77.0 08/05/2016   TRIG 243.0 (H) 08/05/2016   CHOLHDL 8 08/05/2016      HYPERTENSION: The blood  pressure has been treated with various drugs.   Currently taking carvedilol, lisinopril 5 mg and doxazosin 4 mg Twice a day  Chronic kidney disease: Etiology unknown, is followed by nephrologist  Has diuretic was increased Last creatinine:  Lab Results  Component Value Date   CREATININE 1.90 (H) 10/07/2016        Has history of Numbness in his feet  since about 2013, no tingling or burning in feet    He has been wearing diabetic shoes.  He has been followed by podiatrist Including in March  He is  under the care of a podiatrist for Regular care And still has had a small ulcer on the left toe      Physical Examination:  BP 120/60   Pulse 61   Ht 6\' 4"  (1.93 m)   Wt 285 lb (129.3 kg)   SpO2 98%   BMI 34.69 kg/m     ASSESSMENT:  Diabetes type 2, uncontrolled with obesity See history of present illness for detailed discussion of current diabetes management, blood sugar patterns and problems identified   His last A1c was higher at 9.1  His blood sugars are overall still relatively better especially overnight with starting the Humulin R U-500 instead of U-100 He is getting the equivalent of about 100 units a day total with the concentrated insulin Recently No labs available to assess level of control  Unable to assess what his basal rate requirements will be since his basal rates are running inappropriately at different times with the time change on his pump However postprandial readings appear to be relatively better whenever checked  PLAN:   The date and time were changed on his pump and showed him how to do this Reminded him to bolus 30 minutes before eating, he is trying to do this more regularly recently More consistent blood sugars after various meals He needs to check blood sugars regularly 4 times a day  His pump settings were changed in the office today BASAL rates:Midnight = 0.7   Hypertension: Blood pressure is controlled   Total visit time for  evaluation and management of diabetes, downloading insulin pump, review of blood sugar patterns, reprogramming insulin pump, analysis of day-to-day management and counseling = 25 minutes  There are no Patient Instructions on file for this visit.  Able Malloy 12/23/2016, 10:35 AM   Note: This office note was prepared with Dragon voice recognition system technology. Any transcriptional errors that result from this process are unintentional.

## 2016-12-27 ENCOUNTER — Ambulatory Visit (INDEPENDENT_AMBULATORY_CARE_PROVIDER_SITE_OTHER): Payer: Medicare Other | Admitting: Sports Medicine

## 2016-12-27 DIAGNOSIS — M2042 Other hammer toe(s) (acquired), left foot: Secondary | ICD-10-CM

## 2016-12-27 DIAGNOSIS — L97521 Non-pressure chronic ulcer of other part of left foot limited to breakdown of skin: Secondary | ICD-10-CM | POA: Diagnosis not present

## 2016-12-27 DIAGNOSIS — E0842 Diabetes mellitus due to underlying condition with diabetic polyneuropathy: Secondary | ICD-10-CM | POA: Diagnosis not present

## 2016-12-27 DIAGNOSIS — Z899 Acquired absence of limb, unspecified: Secondary | ICD-10-CM | POA: Diagnosis not present

## 2016-12-27 NOTE — Progress Notes (Signed)
Subjective: David [name] is a 76 y.o. male patient seen in office for follow up evaluation of ulceration of the left 3rd toe. Patient has a history of diabetes and a blood glucose "good".  Patient is changing the dressing using PRISMA at home with help from wife has gotten Regranex but hasn't started using it. Denies nausea/fever/vomiting/chills/night sweats/shortness of breath/pain. Patient has no other pedal complaints at this time.  Patient Active Problem List   Diagnosis Date Noted  . Chronic ulcer of right foot ([place]) 08/09/2016  . Otitis, externa, infective 03/11/2016  . Rib fracture 07/24/2015  . Syncope 07/24/2015  . 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 Osteomyelitis of foot, right, acute ([place]) 05/04/2015  . Ankle fracture, left 10/29/2014  . OSA on CPAP 10/26/2014  . Back pain 10/16/2014  . Left leg numbness 10/16/2014  . Fatigue 10/16/2014  . Impacted cerumen of both ears 10/16/2014  . Episodic lightheadedness 08/04/2014  . Open toe wound 07/18/2014  . Diabetes mellitus with neurological manifestations, uncontrolled ([place]) 02/27/2014  . Mixed hyperlipidemia 02/27/2014  . Seasonal and perennial allergic rhinitis 10/25/2013  . Essential hypertension 10/25/2013  . Pure hypercholesterolemia 10/25/2013  . Obesity 10/25/2013  . Renal insufficiency 10/25/2013   Current Outpatient Prescriptions on File Prior to Visit  Medication Sig Dispense Refill  . ACCU-CHEK SOFTCLIX LANCETS lancets USE AS INSTRUCTED TO CHECK BLOOD SUGAR 6 TIMES PER DAY Dx: Code E11.9 300 each 2  . aspirin EC 81 MG tablet Take 81 mg by mouth daily.    . calcitRIOL (ROCALTROL) 0.25 MCG capsule Take 0.25 mcg by mouth daily.     . carvedilol (COREG) 25 MG tablet Take 1 tablet (25 mg total) by mouth 2 (two) times daily with a meal. 90 tablet 1  . doxazosin (CARDURA) 4 MG tablet TAKE ONE TABLET BY MOUTH ONCE DAILY 90 tablet 0  . glucose blood (BAYER CONTOUR NEXT TEST) test strip Use to  test blood sugar 4 times daily    Dx code is E11.49 150 each 3  . insulin aspart (NOVOLOG) 100 UNIT/ML injection USE MAX 120 UNITS PER DAY WITH CONTINUOUS INSULIN PUMP DX CODE E11.65 60 mL 3  . insulin regular human CONCENTRATED (HUMULIN R) 500 UNIT/ML injection Use 0.25 mL daily or as directed in insulin pump, Dx code E11.65 (Patient taking differently: Use max 120 units daily or as directed in insulin pump, Dx code E11.65) 20 mL 1  . isosorbide mononitrate (IMDUR) 30 MG 24 hr tablet TAKE TWO TABLETS BY MOUTH ONCE DAILY 60 tablet 11  . liraglutide (VICTOZA) 18 MG/3ML SOPN INJECT 0.3MLS (1.8 MG TOTAL) INTO THE SKIN DAILY 9 pen 1  . lisinopril (PRINIVIL,ZESTRIL) 5 MG tablet Take 5 mg by mouth daily.    Marland Kitchen NOVOLOG 100 UNIT/ML injection USE MAX 120 UNITS PER DAY WITH INSULIN PUMP 40 mL 3  . Omega-3 Fatty Acids (FISH OIL) 1000 MG CAPS Take by mouth 2 (two) times daily.    . pantoprazole (PROTONIX) 40 MG tablet TAKE ONE TABLET BY MOUTH ONCE DAILY 90 tablet 1  . sertraline (ZOLOFT) 50 MG tablet TAKE ONE TABLET BY MOUTH ONCE DAILY 90 tablet 2  . torsemide (DEMADEX) 20 MG tablet Take 1 tablets (20 mg) by mouth in the am and 1 tablet (20 mg) in the pm     No current facility-administered medications on file prior to visit.    No Known Allergies  Recent Results (from the past 2160 hour(s))  Hemoglobin A1c  Status: Abnormal   Collection Time: 10/07/16  1:34 PM  Result Value Ref Range   Hgb A1c MFr Bld 9.1 (H) 4.6 - 6.5 %    Comment: Glycemic Control Guidelines for People with Diabetes:Non Diabetic:  <6%Goal of Therapy: <7%Additional Action Suggested:  >9%   Basic metabolic panel     Status: Abnormal   Collection Time: 10/07/16  1:34 PM  Result Value Ref Range   Sodium 138 135 - 145 mEq/L   Potassium 4.9 3.5 - 5.1 mEq/L   Chloride 110 96 - 112 mEq/L   CO2 22 19 - 32 mEq/L   Glucose, Bld 175 (H) 70 - 99 mg/dL   BUN 40 (H) 6 - 23 mg/dL   Creatinine, Ser 1.90 (H) 0.40 - 1.50 mg/dL   Calcium 9.2  8.4 - 10.5 mg/dL   GFR 36.80 (L) >60.00 mL/min  Microalbumin / creatinine urine ratio     Status: Abnormal   Collection Time: 10/07/16  1:34 PM  Result Value Ref Range   Microalb, Ur 7.7 (H) 0.0 - 1.9 mg/dL   Creatinine,U 69.6 mg/dL   Microalb Creat Ratio 11.1 0.0 - 30.0 mg/g  HM DIABETES EYE EXAM     Status: None   Collection Time: 10/21/16 12:00 AM  Result Value Ref Range   HM Diabetic Eye Exam No Retinopathy No Retinopathy  CUP PACEART INCLINIC DEVICE CHECK     Status: Abnormal   Collection Time: 11/18/16  8:46 PM  Result Value Ref Range   Pulse Generator Manufacturer MERM    Date Time Interrogation Session [id number]    Pulse Gen Model A2DR01 Advisa DR MRI    Pulse Gen Serial Number [id number]    Clinic Name [facility]    Implantable Pulse Generator Type Implantable Pulse Generator    Implantable Pulse Generator Implant Date [id number]    Implantable Lead Manufacturer MERM    Implantable Lead Model 5076 CapSureFix Novus    Implantable Lead Serial Number [id number]    Implantable Lead Implant Date 07371062    Implantable Lead Location Detail 1 UNKNOWN    Implantable Lead Location [id number]    Implantable Lead Manufacturer MERM    Implantable Lead Model 5076 CapSureFix Novus    Implantable Lead Serial Number [id number]    Implantable Lead Implant Date [id number]    Implantable Lead Location Detail 1 UNKNOWN    Implantable Lead Location [id number]    Lead Channel Setting Sensing Sensitivity 2.8 mV   Lead Channel Setting Pacing Amplitude 2.5 V   Lead Channel Setting Pacing Pulse Width 0.4 ms   Lead Channel Setting Pacing Amplitude 2.5 V   Lead Channel Impedance Value 399 ohm   Lead Channel Sensing Intrinsic Amplitude 1.4 mV   Lead Channel Pacing Threshold Amplitude 2 V   Lead Channel Pacing Threshold Pulse Width 0.4 ms   Lead Channel Impedance Value 608 ohm   Lead Channel Sensing Intrinsic Amplitude 20 (>) mV   Lead Channel Pacing Threshold Amplitude 1.25 V   Lead  Channel Pacing Threshold Pulse Width 0.4 ms   Battery Voltage 3.01 V   Brady Statistic RA Percent Paced 99.9 %   Brady Statistic RV Percent Paced 0.1 (<) %   Eval Rhythm AS,VS @ 45   CUP PACEART REMOTE DEVICE CHECK     Status: None   Collection Time: 11/22/16  4:00 PM  Result Value Ref Range   Date Time Interrogation Session [id number]    Pulse Generator Manufacturer MERM    Pulse Gen  Model A2DR01 Advisa DR MRI    Pulse Gen Serial Number [id number]    Clinic Name [place] [facility]    Implantable Pulse Generator Type Implantable Pulse Generator    Implantable Pulse Generator Implant Date [id number]    Implantable Lead Manufacturer MERM    Implantable Lead Model 5076 CapSureFix Novus    Implantable Lead Serial Number [id number]    Implantable Lead Implant Date 57262035    Implantable Lead Location Detail 1 UNKNOWN    Implantable Lead Location [id number]    Implantable Lead Manufacturer MERM    Implantable Lead Model 5076 CapSureFix Novus    Implantable Lead Serial Number [id number]    Implantable Lead Implant Date 59741638    Implantable Lead Location Detail 1 UNKNOWN    Implantable Lead Location [phone]    Lead Channel Setting Sensing Sensitivity 2.8 mV   Lead Channel Setting Pacing Amplitude 2.5 V   Lead Channel Setting Pacing Pulse Width 0.4 ms   Lead Channel Setting Pacing Amplitude 2.5 V   Lead Channel Impedance Value 551 ohm   Lead Channel Impedance Value 418 ohm   Lead Channel Sensing Intrinsic Amplitude 1.25 mV   Lead Channel Sensing Intrinsic Amplitude 1.25 mV   Lead Channel Pacing Threshold Amplitude 2.375 V   Lead Channel Pacing Threshold Pulse Width 0.4 ms   Lead Channel Impedance Value 627 ohm   Lead Channel Impedance Value 437 ohm   Lead Channel Sensing Intrinsic Amplitude 21.375 mV   Lead Channel Sensing Intrinsic Amplitude 21.375 mV   Lead Channel Pacing Threshold Amplitude 0.875 V   Lead Channel Pacing Threshold Pulse Width 0.4 ms   Battery Status OK     Battery Remaining Longevity 82 mo   Battery Voltage 3.00 V   Brady Statistic RA Percent Paced 99.95 %   Brady Statistic RV Percent Paced 0.05 %   Brady Statistic AP VP Percent 0.04 %   Brady Statistic AS VP Percent 0 %   Brady Statistic AP VS Percent 99.91 %   Brady Statistic AS VS Percent 0.04 %   Eval Rhythm Ap/Vs     Objective: There were no vitals filed for this visit.  General: Patient is awake, alert, oriented x 3 and in no acute distress.  Dermatology: Skin is warm and dry bilateral with a ulceration present left 3rd toe distal tuft. Ulceration measures 0.2cm x 0.3 cm x 0.1cm,smaller than previous. There is a minimally keratotic border with a granular base. The ulceration does not probe to bone. There is no malodor, no active drainage, no erythema, no edema. No acute signs of infection. Nails are short, thick, and mycotic.    Vascular: Dorsalis Pedis pulse = 1/4 Bilateral,  Posterior Tibial pulse = 1/4 Bilateral,  Capillary Fill Time < 5 seconds  Neurologic: Protective sensation absent to the level of knee using  the 5.07/10g [employer].  Musculosketal:  No Pain with palpation to ulcerated area. No pain with compression to calves bilateral. Hammertoe and history of toe amps on right.  Assessment and Plan:  Problem List Items Addressed This Visit    None    Visit Diagnoses    Toe ulcer, left, limited to breakdown of skin ([place])    -  Primary   Hammertoe of left foot       Diabetic polyneuropathy associated with diabetes mellitus due to underlying condition ([place])       History of amputation         -Complete examination performed -Discussed the  progression of the wound and treatment alternatives. -Cleansed toe ulcer and applied Regranex and bandaid dressing and instructed patient to continue with daily dressings at home consisting of the same; -Advised patient to go to the ER or return to office if the wound worsens or if constitutional symptoms are  present. -Continue with diabetic shoes  -Patient to return to office in 2 weeks for follow up care and evaluation or sooner if problems arise. After healed patient will benefit from tenotomy at left 3rd toe.  Landis Martins, DPM

## 2016-12-31 ENCOUNTER — Other Ambulatory Visit: Payer: Self-pay | Admitting: Family

## 2017-01-09 ENCOUNTER — Other Ambulatory Visit: Payer: Self-pay | Admitting: Endocrinology

## 2017-01-09 ENCOUNTER — Telehealth: Payer: Self-pay | Admitting: Internal Medicine

## 2017-01-09 NOTE — Telephone Encounter (Signed)
Spoke with pt's wife informed her that the transmission was received and reviewed, and that it was normal and his heart rate histograms were on trend with previous device checks, encourage pts wife to have pt follow up with primary care provider as fatigue is a could be a symptom of many different things. Pts wife voiced understanding.

## 2017-01-09 NOTE — Telephone Encounter (Signed)
New message  Pt wife call requesting to speak with RN about getting an appt for this week. Pt wife pt needs to have his pacemaker adjusted. Pt wife states pt is very fatigue. Please call back to discuss

## 2017-01-09 NOTE — Telephone Encounter (Signed)
Spoke with pt's wife, informed her that the changes made to pt's pacemaker from 10/25/16 were instant and if pt was going to experience any fatigue than likely those symptoms would have been evident within 1-2 weeks after programming changes, informed pts wife to send in a transmission to evaluate pacemaker and heart rate histograms. Pts wife stated that she would.

## 2017-01-21 DIAGNOSIS — S0501XA Injury of conjunctiva and corneal abrasion without foreign body, right eye, initial encounter: Secondary | ICD-10-CM | POA: Diagnosis not present

## 2017-01-24 ENCOUNTER — Ambulatory Visit (INDEPENDENT_AMBULATORY_CARE_PROVIDER_SITE_OTHER): Payer: Medicare Other | Admitting: Sports Medicine

## 2017-01-24 ENCOUNTER — Telehealth: Payer: Self-pay | Admitting: Family

## 2017-01-24 ENCOUNTER — Encounter: Payer: Self-pay | Admitting: Sports Medicine

## 2017-01-24 VITALS — BP 129/91 | HR 72 | Resp 16

## 2017-01-24 DIAGNOSIS — L97521 Non-pressure chronic ulcer of other part of left foot limited to breakdown of skin: Secondary | ICD-10-CM | POA: Diagnosis not present

## 2017-01-24 DIAGNOSIS — Z899 Acquired absence of limb, unspecified: Secondary | ICD-10-CM

## 2017-01-24 DIAGNOSIS — M2042 Other hammer toe(s) (acquired), left foot: Secondary | ICD-10-CM

## 2017-01-24 DIAGNOSIS — E0842 Diabetes mellitus due to underlying condition with diabetic polyneuropathy: Secondary | ICD-10-CM

## 2017-01-24 NOTE — Progress Notes (Signed)
Subjective: David [name] is a 76 y.o. male patient seen in office for follow up evaluation of ulceration of the left 3rd toe. Patient has a history of diabetes and a blood glucose "good".  Patient is changing the dressing using Regranex x 1 month. Denies nausea/fever/vomiting/chills/night sweats/shortness of breath/pain in toes. Patient has no other pedal complaints at this time.  Patient Active Problem List   Diagnosis Date Noted  . Chronic ulcer of right foot ([place]) 08/09/2016  . Otitis, externa, infective 03/11/2016  . Rib fracture 07/24/2015  . Syncope 07/24/2015  . 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 Osteomyelitis of foot, right, acute ([place]) 05/04/2015  . Ankle fracture, left 10/29/2014  . OSA on CPAP 10/26/2014  . Back pain 10/16/2014  . Left leg numbness 10/16/2014  . Fatigue 10/16/2014  . Impacted cerumen of both ears 10/16/2014  . Episodic lightheadedness 08/04/2014  . Open toe wound 07/18/2014  . Diabetes mellitus with neurological manifestations, uncontrolled ([place]) 02/27/2014  . Mixed hyperlipidemia 02/27/2014  . Seasonal and perennial allergic rhinitis 10/25/2013  . Essential hypertension 10/25/2013  . Pure hypercholesterolemia 10/25/2013  . Obesity 10/25/2013  . Renal insufficiency 10/25/2013   Current Outpatient Prescriptions on File Prior to Visit  Medication Sig Dispense Refill  . ACCU-CHEK SOFTCLIX LANCETS lancets USE AS INSTRUCTED TO CHECK BLOOD SUGAR 6 TIMES PER DAY Dx: Code E11.9 300 each 2  . aspirin EC 81 MG tablet Take 81 mg by mouth daily.    . calcitRIOL (ROCALTROL) 0.25 MCG capsule Take 0.25 mcg by mouth daily.     . carvedilol (COREG) 25 MG tablet Take 1 tablet (25 mg total) by mouth 2 (two) times daily with a meal. 90 tablet 1  . carvedilol (COREG) 25 MG tablet TAKE ONE TABLET BY MOUTH TWICE DAILY WITH A MEAL 90 tablet 1  . doxazosin (CARDURA) 4 MG tablet TAKE ONE TABLET BY MOUTH ONCE DAILY 90 tablet 0  . glucose blood  (BAYER CONTOUR NEXT TEST) test strip Use to test blood sugar 4 times daily    Dx code is E11.49 150 each 3  . insulin regular human CONCENTRATED (HUMULIN R) 500 UNIT/ML injection Use 0.25 mL daily or as directed in insulin pump, Dx code E11.65 (Patient taking differently: Use max 120 units daily or as directed in insulin pump, Dx code E11.65) 20 mL 1  . isosorbide mononitrate (IMDUR) 30 MG 24 hr tablet TAKE TWO TABLETS BY MOUTH ONCE DAILY 60 tablet 11  . liraglutide (VICTOZA) 18 MG/3ML SOPN INJECT 0.3MLS (1.8 MG TOTAL) INTO THE SKIN DAILY 9 pen 1  . lisinopril (PRINIVIL,ZESTRIL) 5 MG tablet Take 5 mg by mouth daily.    Marland Kitchen NOVOLOG 100 UNIT/ML injection USE MAX 120 UNITS PER DAY WITH INSULIN PUMP 40 mL 3  . NOVOLOG 100 UNIT/ML injection USE MAX 120 UNITS PER DAY WITH INSULIN PUMP 40 mL 1  . Omega-3 Fatty Acids (FISH OIL) 1000 MG CAPS Take by mouth 2 (two) times daily.    . pantoprazole (PROTONIX) 40 MG tablet TAKE ONE TABLET BY MOUTH ONCE DAILY 90 tablet 1  . REGRANEX 0.01 % gel     . sertraline (ZOLOFT) 50 MG tablet TAKE ONE TABLET BY MOUTH ONCE DAILY 90 tablet 2  . torsemide (DEMADEX) 20 MG tablet Take 1 tablets (20 mg) by mouth in the am and 1 tablet (20 mg) in the pm     No current facility-administered medications on file prior to visit.  No Known Allergies  Recent Results (from the past 2160 hour(s))  CUP PACEART INCLINIC DEVICE CHECK     Status: Abnormal   Collection Time: 11/18/16  8:46 PM  Result Value Ref Range   Pulse Generator Manufacturer MERM    Date Time Interrogation Session [id number]    Pulse Gen Model A2DR01 Advisa DR MRI    Pulse Gen Serial Number [id number] [facility] Name [place]    Implantable Pulse Generator Type Implantable Pulse Generator    Implantable Pulse Generator Implant Date 15400867    Implantable Lead Manufacturer MERM    Implantable Lead Model 5076 CapSureFix Novus    Implantable Lead Serial Number [id number]    Implantable Lead Implant  Date 61950932    Implantable Lead Location Detail 1 UNKNOWN    Implantable Lead Location [id number]    Implantable Lead Manufacturer MERM    Implantable Lead Model 5076 CapSureFix Novus    Implantable Lead Serial Number [id number]    Implantable Lead Implant Date 67124580    Implantable Lead Location Detail 1 UNKNOWN    Implantable Lead Location [id number]    Lead Channel Setting Sensing Sensitivity 2.8 mV   Lead Channel Setting Pacing Amplitude 2.5 V   Lead Channel Setting Pacing Pulse Width 0.4 ms   Lead Channel Setting Pacing Amplitude 2.5 V   Lead Channel Impedance Value 399 ohm   Lead Channel Sensing Intrinsic Amplitude 1.4 mV   Lead Channel Pacing Threshold Amplitude 2 V   Lead Channel Pacing Threshold Pulse Width 0.4 ms   Lead Channel Impedance Value 608 ohm   Lead Channel Sensing Intrinsic Amplitude 20 (>) mV   Lead Channel Pacing Threshold Amplitude 1.25 V   Lead Channel Pacing Threshold Pulse Width 0.4 ms   Battery Voltage 3.01 V   Brady Statistic RA Percent Paced 99.9 %   Brady Statistic RV Percent Paced 0.1 (<) %   Eval Rhythm AS,VS @ 45   CUP PACEART REMOTE DEVICE CHECK     Status: None   Collection Time: 11/22/16  4:00 PM  Result Value Ref Range   Date Time Interrogation Session [id number]    Pulse Generator Manufacturer MERM    Pulse Gen Model A2DR01 Advisa DR MRI    Pulse Gen Serial Number [id number]    Clinic Name [place]    Implantable Pulse Generator Type Implantable Pulse Generator    Implantable Pulse Generator Implant Date [id number]    Implantable Lead Manufacturer MERM    Implantable Lead Model 5076 CapSureFix Novus    Implantable Lead Serial Number [id number]    Implantable Lead Implant Date 02409735    Implantable Lead Location Detail 1 UNKNOWN    Implantable Lead Location [id number]    Implantable Lead Manufacturer MERM    Implantable Lead Model 5076 CapSureFix Novus    Implantable Lead Serial Number [id number]    Implantable Lead Implant Date  [id number]    Implantable Lead Location Detail 1 UNKNOWN    Implantable Lead Location [id number]    Lead Channel Setting Sensing Sensitivity 2.8 mV   Lead Channel Setting Pacing Amplitude 2.5 V   Lead Channel Setting Pacing Pulse Width 0.4 ms   Lead Channel Setting Pacing Amplitude 2.5 V   Lead Channel Impedance Value 551 ohm   Lead Channel Impedance Value 418 ohm   Lead Channel Sensing Intrinsic Amplitude 1.25 mV   Lead Channel Sensing Intrinsic Amplitude 1.25 mV   Lead Channel Pacing Threshold Amplitude 2.375 V   Lead Channel  Pacing Threshold Pulse Width 0.4 ms   Lead Channel Impedance Value 627 ohm   Lead Channel Impedance Value 437 ohm   Lead Channel Sensing Intrinsic Amplitude 21.375 mV   Lead Channel Sensing Intrinsic Amplitude 21.375 mV   Lead Channel Pacing Threshold Amplitude 0.875 V   Lead Channel Pacing Threshold Pulse Width 0.4 ms   Battery Status OK    Battery Remaining Longevity 82 mo   Battery Voltage 3.00 V   Brady Statistic RA Percent Paced 99.95 %   Brady Statistic RV Percent Paced 0.05 %   Brady Statistic AP VP Percent 0.04 %   Brady Statistic AS VP Percent 0 %   Brady Statistic AP VS Percent 99.91 %   Brady Statistic AS VS Percent 0.04 %   Eval Rhythm Ap/Vs     Objective: There were no vitals filed for this visit.  General: Patient is awake, alert, oriented x 3 and in no acute distress.  Dermatology: Skin is warm and dry bilateral with a ulceration present left 3rd toe distal tuft. Ulceration measures 0.2cm x 0.1 cm x 0.1cm,smaller than previous. There is a minimally keratotic border with a granular base. The ulceration does not probe to bone. There is no malodor, no active drainage, no erythema, no edema. No acute signs of infection. Nails are short, thick, and mycotic.    Vascular: Dorsalis Pedis pulse = 1/4 Bilateral,  Posterior Tibial pulse = 1/4 Bilateral,  Capillary Fill Time < 5 seconds  Neurologic: Protective sensation absent to the level of knee using   the 5.07/10g [employer].  Musculosketal:  No Pain with palpation to ulcerated area. No pain with compression to calves bilateral. Hammertoe and history of toe amps on right.  Assessment and Plan:  Problem List Items Addressed This Visit    None    Visit Diagnoses    Toe ulcer, left, limited to breakdown of skin ([place])    -  Primary   Hammertoe of left foot       Diabetic polyneuropathy associated with diabetes mellitus due to underlying condition ([place])       History of amputation         -Complete examination performed -Discussed the progression of the wound and treatment alternatives. -Debrided using a sterile tissue nipper keratosis and margins of ulcer to healthy bleeding to level of dermis and applied Regranex and steristrip dressing and instructed patient to continue with daily dressings at home consisting of the same -Advised patient to go to the ER or return to office if the wound worsens or if constitutional symptoms are present. -Continue with diabetic shoes  -Patient to return to office in 3 weeks for follow up care and evaluation or sooner if problems arise. After healed patient will benefit from tenotomy at left 3rd toe as previously discussed  Landis Martins, DPM

## 2017-01-24 NOTE — Telephone Encounter (Signed)
Patient would like to transfer PCP, wanted ok from both providers.

## 2017-01-24 NOTE — Telephone Encounter (Signed)
Ok with me 

## 2017-01-24 NOTE — Telephone Encounter (Signed)
Okay 

## 2017-01-27 ENCOUNTER — Ambulatory Visit (INDEPENDENT_AMBULATORY_CARE_PROVIDER_SITE_OTHER): Payer: Medicare Other

## 2017-01-27 ENCOUNTER — Ambulatory Visit (INDEPENDENT_AMBULATORY_CARE_PROVIDER_SITE_OTHER): Payer: Medicare Other | Admitting: Family Medicine

## 2017-01-27 ENCOUNTER — Encounter: Payer: Self-pay | Admitting: Family Medicine

## 2017-01-27 VITALS — BP 128/74 | HR 67 | Temp 98.3°F | Ht 76.0 in | Wt 287.4 lb

## 2017-01-27 DIAGNOSIS — E538 Deficiency of other specified B group vitamins: Secondary | ICD-10-CM | POA: Insufficient documentation

## 2017-01-27 DIAGNOSIS — N183 Chronic kidney disease, stage 3 unspecified: Secondary | ICD-10-CM

## 2017-01-27 DIAGNOSIS — Z79899 Other long term (current) drug therapy: Secondary | ICD-10-CM

## 2017-01-27 DIAGNOSIS — R0781 Pleurodynia: Secondary | ICD-10-CM | POA: Diagnosis not present

## 2017-01-27 DIAGNOSIS — S2232XA Fracture of one rib, left side, initial encounter for closed fracture: Secondary | ICD-10-CM | POA: Diagnosis not present

## 2017-01-27 DIAGNOSIS — S2242XA Multiple fractures of ribs, left side, initial encounter for closed fracture: Secondary | ICD-10-CM | POA: Diagnosis not present

## 2017-01-27 DIAGNOSIS — R0789 Other chest pain: Secondary | ICD-10-CM

## 2017-01-27 DIAGNOSIS — M8588 Other specified disorders of bone density and structure, other site: Secondary | ICD-10-CM

## 2017-01-27 DIAGNOSIS — R5383 Other fatigue: Secondary | ICD-10-CM | POA: Diagnosis not present

## 2017-01-27 MED ORDER — ACETAMINOPHEN-CODEINE #3 300-30 MG PO TABS: 1.0000 | ORAL_TABLET | ORAL | 0 refills | Status: AC | PRN

## 2017-01-27 NOTE — Assessment & Plan Note (Signed)
Likely multifactorial in setitng of CKD, uncontrolled DM, bradycardia s/p pacemaker, and OSA. Will check TSH, CMET, CBC, Vitamin D, and B12 (high risk due to protonix).

## 2017-01-27 NOTE — Assessment & Plan Note (Signed)
Noted on plain film. Will give Tylenol #3 to help with pain. Given that patient has had reucrrent fractures after falls from standing, will also order DXA scan to rule out osteopenia/osteoporosis. Will check vitamin D level.

## 2017-01-27 NOTE — Progress Notes (Addendum)
   Subjective:  David [name] is a 76 y.o. male who presents today with a chief complaint of fatigue.   HPI:  Fatigue, New Problem Started about 2 months ago. Feels very fatigued throughout the day. Sleeping a lot during the day. Sleeps all night also. No fevers, chills. Uses CPAP machine about 6 hours per night. Some weight gain.   Back Pain, Acute Problem Patient suffered a mechanical fall about 2 weeks ago in his bedroom. Fell to the floor after hitting edge of the table. Pain is getting better. Heating pad helps. Tylenol helps. He has a history of rib pain and thinks that this is similar.   ROS: Per HPI  PMH: Smoking history reviewed. Former smoker.    Objective:  Physical Exam: BP 128/74   Pulse 67   Temp 98.3 F (36.8 C) (Oral)   Ht 6\' 4"  (1.93 m)   Wt 287 lb 6.4 oz (130.4 kg)   SpO2 97%   BMI 34.98 kg/m   Gen: NAD, resting comfortably CV: RRR with no murmurs appreciated Pulm: NWOB, CTAB with no crackles, wheezes, or rhonchi MSK:  -Back: No deformities. Nontender along spine. Mild tenderness in area of left 10th-12th ribs.   Imaging Dg Ribs Unilateral W/chest Left  Result Date: 01/27/2017 CLINICAL DATA:  Fall.  Pain. EXAM: LEFT RIBS AND CHEST - 3+ VIEW COMPARISON:  Chest x-ray 09/26/2016 . FINDINGS: Cardiac pacer with lead tips in right atrium and right ventricle. Bibasilar atelectasis. Small left pleural effusion . Minimally displaced left posterior tenth and eleventh rib fractures noted. Aortoiliac scratched it aortic and visceral atherosclerotic vascular disease. 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 Similar finding noted on prior CT. IMPRESSION: 1. Minimally displaced left posterior tenth and eleventh rib fractures. No pneumothorax. 2. Mild bibasilar base atelectasis and small left pleural effusions. Electronically Signed   By: Marcello Moores  Register   On: 01/27/2017 12:02   Assessment/Plan:  Fatigue Likely multifactorial in setitng of CKD, uncontrolled  DM, bradycardia s/p pacemaker, and OSA. Will check TSH, CMET, CBC, Vitamin D, and B12 (high risk due to protonix).  Rib fracture Noted on plain film. Will give Tylenol #3 to help with pain. Given that patient has had reucrrent fractures after falls from standing, will also order DXA scan to rule out osteopenia/osteoporosis. Will check vitamin D level.   CKD Check CMET for underlying causes of fatigue.   [name], MD 01/27/2017 12:06 PM

## 2017-01-27 NOTE — Patient Instructions (Addendum)
We will check blood work today.  You have a rib fracture. Take the tylenol #3 as needed.  Come back in 1-2 weeks if not improving, otherwise plan on coming back in about 6 months.  Take care,  Dr [name]

## 2017-01-28 LAB — COMPREHENSIVE METABOLIC PANEL
AG Ratio: 1.4 (calc) (ref 1.0–2.5)
ALT: 16 U/L (ref 9–46)
AST: 13 U/L (ref 10–35)
Albumin: 3.9 g/dL (ref 3.6–5.1)
Alkaline phosphatase (APISO): 97 U/L (ref 40–115)
BUN/Creatinine Ratio: 24 (calc) — ABNORMAL HIGH (ref 6–22)
BUN: 43 mg/dL — ABNORMAL HIGH (ref 7–25)
CO2: 24 mmol/L (ref 20–32)
Calcium: 9.3 mg/dL (ref 8.6–10.3)
Chloride: 108 mmol/L (ref 98–110)
Creat: 1.82 mg/dL — ABNORMAL HIGH (ref 0.70–1.18)
Globulin: 2.7 g/dL (calc) (ref 1.9–3.7)
Glucose, Bld: 158 mg/dL — ABNORMAL HIGH (ref 65–99)
Potassium: 5.4 mmol/L — ABNORMAL HIGH (ref 3.5–5.3)
Sodium: 140 mmol/L (ref 135–146)
Total Bilirubin: 0.4 mg/dL (ref 0.2–1.2)
Total Protein: 6.6 g/dL (ref 6.1–8.1)

## 2017-01-28 LAB — CBC
HCT: 34.6 % — ABNORMAL LOW (ref 38.5–50.0)
Hemoglobin: 11.6 g/dL — ABNORMAL LOW (ref 13.2–17.1)
MCH: 30.6 pg (ref 27.0–33.0)
MCHC: 33.5 g/dL (ref 32.0–36.0)
MCV: 91.3 fL (ref 80.0–100.0)
MPV: 10.8 fL (ref 7.5–12.5)
Platelets: 191 10*3/uL (ref 140–400)
RBC: 3.79 10*6/uL — ABNORMAL LOW (ref 4.20–5.80)
RDW: 14.7 % (ref 11.0–15.0)
WBC: 8.2 10*3/uL (ref 3.8–10.8)

## 2017-01-28 LAB — VITAMIN B12: Vitamin B-12: 170 pg/mL — ABNORMAL LOW (ref 200–1100)

## 2017-01-28 LAB — TSH: TSH: 1.82 mIU/L (ref 0.40–4.50)

## 2017-01-28 LAB — VITAMIN D 25 HYDROXY (VIT D DEFICIENCY, FRACTURES): Vit D, 25-Hydroxy: 21 ng/mL — ABNORMAL LOW (ref 30–100)

## 2017-01-30 DIAGNOSIS — S0501XS Injury of conjunctiva and corneal abrasion without foreign body, right eye, sequela: Secondary | ICD-10-CM | POA: Diagnosis not present

## 2017-02-01 ENCOUNTER — Other Ambulatory Visit: Payer: Self-pay

## 2017-02-01 DIAGNOSIS — E538 Deficiency of other specified B group vitamins: Secondary | ICD-10-CM

## 2017-02-01 MED ORDER — VITAMIN D 1000 UNITS PO TABS: 1000.0000 [IU] | ORAL_TABLET | Freq: Every day | ORAL | 1 refills | Status: DC

## 2017-02-01 MED ORDER — CYANOCOBALAMIN 1000 MCG/ML IJ SOLN: 1000.0000 ug | Freq: Once | INTRAMUSCULAR | Status: DC

## 2017-02-02 ENCOUNTER — Ambulatory Visit (INDEPENDENT_AMBULATORY_CARE_PROVIDER_SITE_OTHER): Payer: Medicare Other | Admitting: Emergency Medicine

## 2017-02-02 DIAGNOSIS — E538 Deficiency of other specified B group vitamins: Secondary | ICD-10-CM | POA: Diagnosis not present

## 2017-02-02 MED ORDER — CYANOCOBALAMIN 1000 MCG/ML IJ SOLN
1000.0000 ug | Freq: Once | INTRAMUSCULAR | Status: AC
  Administered 2017-02-02: 1000 ug via INTRAMUSCULAR

## 2017-02-02 NOTE — Progress Notes (Signed)
Patient received vitamin B12 injection today. Follow up in 1 week for next injection.  David [name]. [name], MD 02/02/2017 11:59 AM

## 2017-02-02 NOTE — Progress Notes (Signed)
Patient here today for his first Vitamin D injection. Patient tolerated well and follow up injection scheduled for one week.

## 2017-02-07 ENCOUNTER — Other Ambulatory Visit: Payer: Self-pay | Admitting: Family

## 2017-02-09 ENCOUNTER — Ambulatory Visit (INDEPENDENT_AMBULATORY_CARE_PROVIDER_SITE_OTHER): Payer: Medicare Other | Admitting: *Deleted

## 2017-02-09 DIAGNOSIS — Z23 Encounter for immunization: Secondary | ICD-10-CM | POA: Diagnosis not present

## 2017-02-09 DIAGNOSIS — E538 Deficiency of other specified B group vitamins: Secondary | ICD-10-CM | POA: Diagnosis not present

## 2017-02-09 MED ORDER — CYANOCOBALAMIN 1000 MCG/ML IJ SOLN
1000.0000 ug | Freq: Once | INTRAMUSCULAR | Status: AC
  Administered 2017-02-09: 1000 ug via INTRAMUSCULAR

## 2017-02-09 NOTE — Progress Notes (Signed)
I have reviewed the patient's encounter and agree with the documentation.  David [name]. [name], MD 02/09/2017 3:29 PM

## 2017-02-09 NOTE — Progress Notes (Signed)
Pt received B12 vaccine. Pt tolerated well. Pt told to follow up in 1 week.

## 2017-02-13 DIAGNOSIS — S0501XS Injury of conjunctiva and corneal abrasion without foreign body, right eye, sequela: Secondary | ICD-10-CM | POA: Diagnosis not present

## 2017-02-14 ENCOUNTER — Encounter: Payer: Self-pay | Admitting: Sports Medicine

## 2017-02-14 ENCOUNTER — Ambulatory Visit (INDEPENDENT_AMBULATORY_CARE_PROVIDER_SITE_OTHER): Payer: Medicare Other | Admitting: Sports Medicine

## 2017-02-14 VITALS — BP 159/50 | HR 80 | Resp 16

## 2017-02-14 DIAGNOSIS — E0842 Diabetes mellitus due to underlying condition with diabetic polyneuropathy: Secondary | ICD-10-CM

## 2017-02-14 DIAGNOSIS — B351 Tinea unguium: Secondary | ICD-10-CM

## 2017-02-14 DIAGNOSIS — Z899 Acquired absence of limb, unspecified: Secondary | ICD-10-CM | POA: Diagnosis not present

## 2017-02-14 DIAGNOSIS — M79674 Pain in right toe(s): Secondary | ICD-10-CM

## 2017-02-14 DIAGNOSIS — L97521 Non-pressure chronic ulcer of other part of left foot limited to breakdown of skin: Secondary | ICD-10-CM

## 2017-02-14 DIAGNOSIS — M79675 Pain in left toe(s): Secondary | ICD-10-CM | POA: Diagnosis not present

## 2017-02-14 NOTE — Progress Notes (Signed)
Subjective: David [name] is a 76 y.o. male patient seen in office for follow up evaluation of ulceration of the left 3rd toe. Patient has a history of diabetes and a blood glucose "good".  Patient is changing the dressing using Regranex x 1 month. Denies nausea/fever/vomiting/chills/night sweats/shortness of breath/pain in toes. Patient has no other pedal complaints at this time.  Patient Active Problem List   Diagnosis Date Noted  . Fatigue 01/27/2017  . Chronic ulcer of right foot ([place]) 08/09/2016  . Rib fracture 07/24/2015  . Syncope 07/24/2015  . 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 OSA on CPAP 10/26/2014  . Back pain 10/16/2014  . Left leg numbness 10/16/2014  . Episodic lightheadedness 08/04/2014  . Open toe wound 07/18/2014  . Diabetes mellitus with neurological manifestations, uncontrolled ([place]) 02/27/2014  . Seasonal and perennial allergic rhinitis 10/25/2013  . Hypertension associated with diabetes ([place]) 10/25/2013  . Hyperlipidemia associated with type 2 diabetes mellitus ([place]) 10/25/2013  . Obesity 10/25/2013  . CKD stage 3 due to type 2 diabetes mellitus ([place]) 10/25/2013   Current Outpatient Prescriptions on File Prior to Visit  Medication Sig Dispense Refill  . ACCU-CHEK SOFTCLIX LANCETS lancets USE AS INSTRUCTED TO CHECK BLOOD SUGAR 6 TIMES PER DAY Dx: Code E11.9 300 each 2  . aspirin EC 81 MG tablet Take 81 mg by mouth daily.    . calcitRIOL (ROCALTROL) 0.25 MCG capsule Take 0.25 mcg by mouth daily.     . carvedilol (COREG) 25 MG tablet Take 1 tablet (25 mg total) by mouth 2 (two) times daily with a meal. 90 tablet 1  . carvedilol (COREG) 25 MG tablet TAKE ONE TABLET BY MOUTH TWICE DAILY WITH A MEAL 90 tablet 1  . cholecalciferol (VITAMIN D) 1000 units tablet Take 1 tablet (1,000 Units total) by mouth daily. 30 tablet 1  . doxazosin (CARDURA) 4 MG tablet TAKE ONE TABLET BY MOUTH ONCE DAILY 90 tablet 0  . glucose blood (BAYER CONTOUR  NEXT TEST) test strip Use to test blood sugar 4 times daily    Dx code is E11.49 150 each 3  . insulin regular human CONCENTRATED (HUMULIN R) 500 UNIT/ML injection Use 0.25 mL daily or as directed in insulin pump, Dx code E11.65 (Patient taking differently: Use max 120 units daily or as directed in insulin pump, Dx code E11.65) 20 mL 1  . isosorbide mononitrate (IMDUR) 30 MG 24 hr tablet TAKE TWO TABLETS BY MOUTH ONCE DAILY 60 tablet 11  . liraglutide (VICTOZA) 18 MG/3ML SOPN INJECT 0.3MLS (1.8 MG TOTAL) INTO THE SKIN DAILY 9 pen 1  . lisinopril (PRINIVIL,ZESTRIL) 5 MG tablet Take 5 mg by mouth daily.    Marland Kitchen NOVOLOG 100 UNIT/ML injection USE MAX 120 UNITS PER DAY WITH INSULIN PUMP 40 mL 3  . NOVOLOG 100 UNIT/ML injection USE MAX 120 UNITS PER DAY WITH INSULIN PUMP 40 mL 1  . Omega-3 Fatty Acids (FISH OIL) 1000 MG CAPS Take by mouth 2 (two) times daily.    . pantoprazole (PROTONIX) 40 MG tablet TAKE ONE TABLET BY MOUTH ONCE DAILY 90 tablet 1  . REGRANEX 0.01 % gel     . sertraline (ZOLOFT) 50 MG tablet TAKE ONE TABLET BY MOUTH ONCE DAILY 90 tablet 2  . torsemide (DEMADEX) 20 MG tablet Take 1 tablets (20 mg) by mouth in the am and 1 tablet (20 mg) in the pm     No current facility-administered medications on file prior  to visit.    No Known Allergies  Recent Results (from the past 2160 hour(s))  CUP PACEART INCLINIC DEVICE CHECK     Status: Abnormal   Collection Time: 11/18/16  8:46 PM  Result Value Ref Range   Pulse Generator Manufacturer MERM    Date Time Interrogation Session [id number]    Pulse Gen Model A2DR01 Advisa DR MRI    Pulse Gen Serial Number [id number] [facility] Name [facility]    Implantable Pulse Generator Type Implantable Pulse Generator    Implantable Pulse Generator Implant Date 01093235    Implantable Lead Manufacturer MERM    Implantable Lead Model 5076 CapSureFix Novus    Implantable Lead Serial Number [id number]    Implantable Lead Implant Date 57322025     Implantable Lead Location Detail 1 UNKNOWN    Implantable Lead Location [id number]    Implantable Lead Manufacturer MERM    Implantable Lead Model 5076 CapSureFix Novus    Implantable Lead Serial Number [id number]    Implantable Lead Implant Date 42706237    Implantable Lead Location Detail 1 UNKNOWN    Implantable Lead Location [id number]    Lead Channel Setting Sensing Sensitivity 2.8 mV   Lead Channel Setting Pacing Amplitude 2.5 V   Lead Channel Setting Pacing Pulse Width 0.4 ms   Lead Channel Setting Pacing Amplitude 2.5 V   Lead Channel Impedance Value 399 ohm   Lead Channel Sensing Intrinsic Amplitude 1.4 mV   Lead Channel Pacing Threshold Amplitude 2 V   Lead Channel Pacing Threshold Pulse Width 0.4 ms   Lead Channel Impedance Value 608 ohm   Lead Channel Sensing Intrinsic Amplitude 20 (>) mV   Lead Channel Pacing Threshold Amplitude 1.25 V   Lead Channel Pacing Threshold Pulse Width 0.4 ms   Battery Voltage 3.01 V   Brady Statistic RA Percent Paced 99.9 %   Brady Statistic RV Percent Paced 0.1 (<) %   Eval Rhythm AS,VS @ 45   CUP PACEART REMOTE DEVICE CHECK     Status: None   Collection Time: 11/22/16  4:00 PM  Result Value Ref Range   Date Time Interrogation Session [id number]    Pulse Generator Manufacturer MERM    Pulse Gen Model A2DR01 Advisa DR MRI    Pulse Gen Serial Number [id number]    Clinic Name [place]    Implantable Pulse Generator Type Implantable Pulse Generator    Implantable Pulse Generator Implant Date [id number]    Implantable Lead Manufacturer MERM    Implantable Lead Model 5076 CapSureFix Novus    Implantable Lead Serial Number [id number]    Implantable Lead Implant Date 00938182    Implantable Lead Location Detail 1 UNKNOWN    Implantable Lead Location [id number]    Implantable Lead Manufacturer MERM    Implantable Lead Model 5076 CapSureFix Novus    Implantable Lead Serial Number [id number]    Implantable Lead Implant Date 93810175     Implantable Lead Location Detail 1 UNKNOWN    Implantable Lead Location [id number]    Lead Channel Setting Sensing Sensitivity 2.8 mV   Lead Channel Setting Pacing Amplitude 2.5 V   Lead Channel Setting Pacing Pulse Width 0.4 ms   Lead Channel Setting Pacing Amplitude 2.5 V   Lead Channel Impedance Value 551 ohm   Lead Channel Impedance Value 418 ohm   Lead Channel Sensing Intrinsic Amplitude 1.25 mV   Lead Channel Sensing Intrinsic Amplitude 1.25 mV   Lead Channel Pacing Threshold Amplitude 2.375  V   Lead Channel Pacing Threshold Pulse Width 0.4 ms   Lead Channel Impedance Value 627 ohm   Lead Channel Impedance Value 437 ohm   Lead Channel Sensing Intrinsic Amplitude 21.375 mV   Lead Channel Sensing Intrinsic Amplitude 21.375 mV   Lead Channel Pacing Threshold Amplitude 0.875 V   Lead Channel Pacing Threshold Pulse Width 0.4 ms   Battery Status OK    Battery Remaining Longevity 82 mo   Battery Voltage 3.00 V   Brady Statistic RA Percent Paced 99.95 %   Brady Statistic RV Percent Paced 0.05 %   Brady Statistic AP VP Percent 0.04 %   Brady Statistic AS VP Percent 0 %   Brady Statistic AP VS Percent 99.91 %   Brady Statistic AS VS Percent 0.04 %   Eval Rhythm Ap/Vs   CBC     Status: Abnormal   Collection Time: 01/27/17 11:56 AM  Result Value Ref Range   WBC 8.2 3.8 - 10.8 Thousand/uL   RBC 3.79 (L) 4.20 - 5.80 Million/uL   Hemoglobin 11.6 (L) 13.2 - 17.1 g/dL   HCT 34.6 (L) 38.5 - 50.0 %   MCV 91.3 80.0 - 100.0 fL   MCH 30.6 27.0 - 33.0 pg   MCHC 33.5 32.0 - 36.0 g/dL   RDW 14.7 11.0 - 15.0 %   Platelets 191 140 - 400 Thousand/uL   MPV 10.8 7.5 - 12.5 fL  Comprehensive metabolic panel     Status: Abnormal   Collection Time: 01/27/17 11:56 AM  Result Value Ref Range   Glucose, Bld 158 (H) 65 - 99 mg/dL    Comment: .            Fasting reference interval . For someone without known diabetes, a glucose value >125 mg/dL indicates that they may have diabetes and this should be  confirmed with a follow-up test. .    BUN 43 (H) 7 - 25 mg/dL   Creat 1.82 (H) 0.70 - 1.18 mg/dL    Comment: For patients >57 years of age, the reference limit for Creatinine is approximately 13% higher for people identified as African-American. .    BUN/Creatinine Ratio 24 (H) 6 - 22 (calc)   Sodium 140 135 - 146 mmol/L   Potassium 5.4 (H) 3.5 - 5.3 mmol/L   Chloride 108 98 - 110 mmol/L   CO2 24 20 - 32 mmol/L   Calcium 9.3 8.6 - 10.3 mg/dL   Total Protein 6.6 6.1 - 8.1 g/dL   Albumin 3.9 3.6 - 5.1 g/dL   Globulin 2.7 1.9 - 3.7 g/dL (calc)   AG Ratio 1.4 1.0 - 2.5 (calc)   Total Bilirubin 0.4 0.2 - 1.2 mg/dL   Alkaline phosphatase (APISO) 97 40 - 115 U/L   AST 13 10 - 35 U/L   ALT 16 9 - 46 U/L  TSH     Status: None   Collection Time: 01/27/17 11:56 AM  Result Value Ref Range   TSH 1.82 0.40 - 4.50 mIU/L  Vitamin B12     Status: Abnormal   Collection Time: 01/27/17 11:56 AM  Result Value Ref Range   Vitamin B-12 170 (L) 200 - 1,100 pg/mL  VITAMIN D 25 Hydroxy (Vit-D Deficiency, Fractures)     Status: Abnormal   Collection Time: 01/27/17 11:56 AM  Result Value Ref Range   Vit D, 25-Hydroxy 21 (L) 30 - 100 ng/mL    Comment: Vitamin D Status  25-OH Vitamin D: . Deficiency:                    <20 ng/mL Insufficiency:             20 - 29 ng/mL Optimal:                 > or = 30 ng/mL . For 25-OH Vitamin D testing on patients on  D2-supplementation and patients for whom quantitation  of D2 and D3 fractions is required, the QuestAssureD(TM) 25-OH VIT D, (D2,D3), LC/MS/MS is recommended: order  code [phone] (patients >73yrs). . For more information on this test, go to: http://education.questdiagnostics.com/faq/FAQ163 (This link is being provided for  informational/educational purposes only.)     Objective: There were no vitals filed for this visit.  General: Patient is awake, alert, oriented x 3 and in no acute distress.  Dermatology: Skin is warm and dry  bilateral with a healed ulceration present left 3rd toe distal tuft, no erythema, no edema. No acute signs of infection. Nails are elongated, thick, and mycotic.    Vascular: Dorsalis Pedis pulse = 1/4 Bilateral,  Posterior Tibial pulse = 1/4 Bilateral,  Capillary Fill Time < 5 seconds  Neurologic: Protective sensation absent to the level of knee using  the 5.07/10g [employer].  Musculosketal:  No Pain with palpation to healed ulceration. No pain with compression to calves bilateral. Hammertoe and history of toe amps on right.  Assessment and Plan:  Problem List Items Addressed This Visit    None    Visit Diagnoses    Toe ulcer, left, limited to breakdown of skin ([place])    -  Primary   healed   Pain due to onychomycosis of toenails of both feet       Diabetic polyneuropathy associated with diabetes mellitus due to underlying condition ([place])       History of amputation         -Complete examination performed -Left 3rd toe ulcer is healed; Dressings no longer needed -Nails mechanically debrided  -Patient to return to office in 6 weeks for follow up care and decide if will benefit from tenotomy at left 3rd toe as previously discussed  Landis Martins, DPM

## 2017-02-16 ENCOUNTER — Ambulatory Visit: Payer: Medicare Other

## 2017-02-17 ENCOUNTER — Other Ambulatory Visit (INDEPENDENT_AMBULATORY_CARE_PROVIDER_SITE_OTHER): Payer: Medicare Other

## 2017-02-17 DIAGNOSIS — Z794 Long term (current) use of insulin: Secondary | ICD-10-CM

## 2017-02-17 DIAGNOSIS — E1165 Type 2 diabetes mellitus with hyperglycemia: Secondary | ICD-10-CM | POA: Diagnosis not present

## 2017-02-17 LAB — BASIC METABOLIC PANEL
BUN: 37 mg/dL — ABNORMAL HIGH (ref 6–23)
CO2: 24 mEq/L (ref 19–32)
Calcium: 9.3 mg/dL (ref 8.4–10.5)
Chloride: 104 mEq/L (ref 96–112)
Creatinine, Ser: 2.04 mg/dL — ABNORMAL HIGH (ref 0.40–1.50)
GFR: 33.87 mL/min — ABNORMAL LOW (ref >60.00)
Glucose, Bld: 217 mg/dL — ABNORMAL HIGH (ref 70–99)
Potassium: 5 mEq/L (ref 3.5–5.1)
Sodium: 136 mEq/L (ref 135–145)

## 2017-02-17 LAB — LIPID PANEL
Cholesterol: 158 mg/dL (ref 0–200)
HDL: 25.4 mg/dL — ABNORMAL LOW (ref >39.00)
LDL Cholesterol: 94 mg/dL (ref 0–99)
NonHDL: 133.01
Total CHOL/HDL Ratio: 6
Triglycerides: 194 mg/dL — ABNORMAL HIGH (ref 0.0–149.0)
VLDL: 38.8 mg/dL (ref 0.0–40.0)

## 2017-02-17 LAB — HEMOGLOBIN A1C: Hgb A1c MFr Bld: 8.2 % — ABNORMAL HIGH (ref 4.6–6.5)

## 2017-02-18 ENCOUNTER — Other Ambulatory Visit: Payer: Self-pay | Admitting: Endocrinology

## 2017-02-20 ENCOUNTER — Ambulatory Visit (INDEPENDENT_AMBULATORY_CARE_PROVIDER_SITE_OTHER): Payer: Medicare Other | Admitting: *Deleted

## 2017-02-20 ENCOUNTER — Other Ambulatory Visit: Payer: Self-pay | Admitting: Endocrinology

## 2017-02-20 DIAGNOSIS — E538 Deficiency of other specified B group vitamins: Secondary | ICD-10-CM

## 2017-02-20 MED ORDER — CYANOCOBALAMIN 1000 MCG/ML IJ SOLN
1000.0000 ug | Freq: Once | INTRAMUSCULAR | Status: AC
  Administered 2017-02-20: 1000 ug via INTRAMUSCULAR

## 2017-02-20 NOTE — Progress Notes (Signed)
I have reviewed the patient's encounter and agree with the documentation.  [name], MD 02/20/2017 4:09 PM

## 2017-02-21 ENCOUNTER — Ambulatory Visit (INDEPENDENT_AMBULATORY_CARE_PROVIDER_SITE_OTHER): Payer: Medicare Other | Admitting: *Deleted

## 2017-02-21 ENCOUNTER — Telehealth: Payer: Self-pay | Admitting: Cardiology

## 2017-02-21 DIAGNOSIS — I495 Sick sinus syndrome: Secondary | ICD-10-CM

## 2017-02-21 NOTE — Telephone Encounter (Signed)
LMOVM reminding pt to send remote transmission.   

## 2017-02-21 NOTE — Progress Notes (Signed)
Remote pacemaker transmission.   

## 2017-02-22 ENCOUNTER — Encounter: Payer: Self-pay | Admitting: Endocrinology

## 2017-02-22 ENCOUNTER — Ambulatory Visit (INDEPENDENT_AMBULATORY_CARE_PROVIDER_SITE_OTHER): Payer: Medicare Other | Admitting: Endocrinology

## 2017-02-22 VITALS — BP 142/76 | HR 66 | Ht 76.0 in | Wt 290.6 lb

## 2017-02-22 DIAGNOSIS — E1165 Type 2 diabetes mellitus with hyperglycemia: Secondary | ICD-10-CM | POA: Diagnosis not present

## 2017-02-22 DIAGNOSIS — E782 Mixed hyperlipidemia: Secondary | ICD-10-CM | POA: Diagnosis not present

## 2017-02-22 DIAGNOSIS — Z794 Long term (current) use of insulin: Secondary | ICD-10-CM

## 2017-02-22 LAB — CUP PACEART REMOTE DEVICE CHECK
Battery Remaining Longevity: 80 mo
Battery Voltage: 3 V
Brady Statistic AP VP Percent: 0.06 %
Brady Statistic AP VS Percent: 99.84 %
Brady Statistic AS VP Percent: 0 %
Brady Statistic AS VS Percent: 0.1 %
Brady Statistic RA Percent Paced: 99.89 %
Brady Statistic RV Percent Paced: 0.08 %
Date Time Interrogation Session: 20181009144700
Implantable Lead Implant Date: 20091102
Implantable Lead Implant Date: 20091102
Implantable Lead Location: 753859
Implantable Lead Location: 753860
Implantable Lead Model: 5076
Implantable Lead Model: 5076
Implantable Pulse Generator Implant Date: 20170118
Lead Channel Impedance Value: 418 Ohm
Lead Channel Impedance Value: 418 Ohm
Lead Channel Impedance Value: 551 Ohm
Lead Channel Impedance Value: 703 Ohm
Lead Channel Pacing Threshold Amplitude: 1.25 V
Lead Channel Pacing Threshold Amplitude: 2.25 V
Lead Channel Pacing Threshold Pulse Width: 0.4 ms
Lead Channel Pacing Threshold Pulse Width: 0.4 ms
Lead Channel Sensing Intrinsic Amplitude: 1.25 mV
Lead Channel Sensing Intrinsic Amplitude: 1.25 mV
Lead Channel Sensing Intrinsic Amplitude: 21.25 mV
Lead Channel Sensing Intrinsic Amplitude: 21.25 mV
Lead Channel Setting Pacing Amplitude: 2.5 V
Lead Channel Setting Pacing Amplitude: 2.5 V
Lead Channel Setting Pacing Pulse Width: 0.4 ms
Lead Channel Setting Sensing Sensitivity: 2.8 mV

## 2017-02-22 MED ORDER — ICOSAPENT ETHYL 1 G PO CAPS: ORAL_CAPSULE | ORAL | 2 refills | Status: DC

## 2017-02-22 NOTE — Patient Instructions (Addendum)
Restart exercise daily  BOLUS FOR bedtime snack and all carbs

## 2017-02-22 NOTE — Progress Notes (Signed)
Patient ID: David [name], male   DOB: 1940/12/22, 76 y.o.   MRN: [id number]    Reason for Appointment: Followup for Type 2 Diabetes  History of Present Illness:          Diagnosis: Type 2 diabetes mellitus, date of diagnosis:   1992       Past history:  He was initially treated with metformin and at some point also glipizide. His previous records from out of town are not available and no information is available about his level of control Apparently he was started on insulin in 1994 approximately because of poor control He has been on various insulin regimens over the last several years However even with insulin he has had poor control for at least the last 7 or 8 years. He does not know what his previous A1c levels have been. He had been continued on metformin and glipizide but metformin stopped about 2 years ago because of kidney function abnormality He had been taking Lantus 60 units twice a day with NovoLog previously and also Before his initial consultation in 6/15 he was on NovoLog twice a day and Humalog mix insulin  Because of poor control and large insulin doses he was started on Victoza in 6/15  Recent history:   INSULIN PUMP  regimen with U-500 insulin is as follows Basal rate: MN: 0.7 6AM: 1.0, , 3PM: 0.85, 9PM: 0.7.  I/C ratio: 25, ISF: 45, target 120-150.   Since 04/28/14 he has been on a [employer]tronic insulin pump because of persistent poor control and high insulin requirement  Initially with the pump his blood sugars had been significantly better but subsequently blood sugars have been again high with A1c mostly over 8%  A1c  Continues to be higher and now 8.2, was 9.1   Current management, problems and blood sugar patterns:  His blood sugars are now in better even with changing his settings on the last visit when he had his a.m. and p.m. times backwards  He still has difficulty trying to bolus for every meal consistently and now he says that he is  generally having a snack of 25-30 grams carbohydrate at bedtime and will not bolus for this  Even with increasing his basal rate at night his FASTING readings are overall the highest  Blood sugars are somewhat better later in the day and only occasionally below 150 midday or afternoon  Again not checking blood sugars after his meals at night  Still not motivated to exercise  He thinks he is watching his portions and only large meals at suppertime  He changes his infusion set only when he is running out of insulin and be every 10 days  He is doing 1.8 mg Victoza also as before       Oral hypoglycemic drugs the patient is taking are: None, has renal dysfunction        Glucose monitoring:  done about 3 times a day         Glucometer:  contour     Blood Glucose readings from pump download:  Mean values apply above for all meters except median for One Touch  PRE-MEAL Fasting Lunch Dinner Bedtime Overall  Glucose range:       Mean/median: 240  229   229   195    Hypoglycemia: None   Self-care:   Meals: 2- 3 meals per day. May skip breakfast otherwise breakfast is Just toast.  Will have half sandwich at 1  pm , dinner at  6-7 pm.    He thinks his portions are small but periodically eats sweets.   Bedtime snack is usually crackers  with milk Or fruit like peaches from cans          Exercise:  Walking occasionally   Last night consultation : Most recent: several years ago.       Wt Readings from Last 3 Encounters:  02/22/17 290 lb 9.6 oz (131.8 kg)  01/27/17 287 lb 6.4 oz (130.4 kg)  12/23/16 285 lb (129.3 kg)   Glycemic control:   Lab Results  Component Value Date   HGBA1C 8.2 (H) 02/17/2017   HGBA1C 9.1 (H) 10/07/2016   HGBA1C 8.1 (H) 05/30/2016   Lab Results  Component Value Date   MICROALBUR 7.7 (H) 10/07/2016   LDLCALC 94 02/17/2017   CREATININE 2.04 (H) 02/17/2017            Lab Results  Component Value Date   FRUCTOSAMINE 328 (H) 08/05/2016   FRUCTOSAMINE  326 (H) 01/30/2014     Other active problems: See review of systems   Allergies as of 02/22/2017   No Known Allergies     Medication List       Accurate as of 02/22/17  1:23 PM. Always use your most recent med list.          ACCU-CHEK SOFTCLIX LANCETS lancets USE AS INSTRUCTED TO CHECK BLOOD SUGAR 6 TIMES PER DAY Dx: Code E11.9   aspirin EC 81 MG tablet Take 81 mg by mouth daily.   calcitRIOL 0.25 MCG capsule Commonly known as:  ROCALTROL Take 0.25 mcg by mouth daily.   carvedilol 25 MG tablet Commonly known as:  COREG TAKE ONE TABLET BY MOUTH TWICE DAILY WITH A MEAL   cholecalciferol 1000 units tablet Commonly known as:  VITAMIN D Take 1 tablet (1,000 Units total) by mouth daily.   doxazosin 4 MG tablet Commonly known as:  CARDURA TAKE ONE TABLET BY MOUTH ONCE DAILY   Fish Oil 1000 MG Caps Take by mouth 2 (two) times daily.   glucose blood test strip Commonly known as:  BAYER CONTOUR NEXT TEST Use to test blood sugar 4 times daily    Dx code is E11.49   HUMULIN R 500 UNIT/ML injection Generic drug:  insulin regular human CONCENTRATED USE 0.25 ML DAILY OR AS DIRECTED IN INSULIN PUMP.   isosorbide mononitrate 30 MG 24 hr tablet Commonly known as:  IMDUR TAKE TWO TABLETS BY MOUTH ONCE DAILY   liraglutide 18 MG/3ML Sopn Commonly known as:  VICTOZA INJECT 0.3MLS (1.8 MG TOTAL) INTO THE SKIN DAILY   lisinopril 5 MG tablet Commonly known as:  PRINIVIL,ZESTRIL Take 5 mg by mouth daily.   pantoprazole 40 MG tablet Commonly known as:  PROTONIX TAKE ONE TABLET BY MOUTH ONCE DAILY   REGRANEX 0.01 % gel Generic drug:  becaplermin   sertraline 50 MG tablet Commonly known as:  ZOLOFT TAKE ONE TABLET BY MOUTH ONCE DAILY   torsemide 20 MG tablet Commonly known as:  DEMADEX Take 1 tablets (20 mg) by mouth in the am and 1 tablet (20 mg) in the pm       Allergies: No Known Allergies  Past Medical History:  Diagnosis Date  . Anemia, iron deficiency     . Anxiety   . Arthritis   . BPH (benign prostatic hypertrophy)   . CAD (coronary artery disease)    Nonobstructive CAD per cath  . Cardiac  pacemaker in situ   . CHF (congestive heart failure) ([place])   . Chronic ulcer of right foot ([place])   . CKD (chronic kidney disease), stage III ([place]) secondary to DM and HTN   nephrologist-  [name]  . Dyspnea   . History of cellulitis    right great toe 10-25-2014  . History of skin cancer   . HOCM (hypertrophic obstructive cardiomyopathy) ([place])   . Hypertension   . Insulin dependent type 2 diabetes mellitus ([place]) 1991   followd by [name]--  has insulin pump  . Insulin pump in place   . OSA on CPAP   . Peripheral neuropathy    severe  . Peripheral vascular disease ([place])    bilateral lower extremities  . Secondary hyperparathyroidism of renal origin ([place])   . Sinus node dysfunction [facility])     Past Surgical History:  Procedure Laterality Date  . 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 AMPUTATION TOE Right 03/12/2015   Procedure: RIGHT HALLUS AMPUTATION ;  Surgeon: [name], MD;  Location: [place];  Service: Podiatry;  Laterality: Right;  . CARDIAC CATHETERIZATION  11-25-2010   [place], [place]   Nonobstructive CAD  . CARDIAC PACEMAKER PLACEMENT  Nov 2009   Medtronic  . EP IMPLANTABLE DEVICE N/A 06/03/2015   Procedure: PPM Generator Changeout;  Surgeon: [name], MD;  Location: [place] [facility];  Service: Cardiovascular;  Laterality: N/A;  . EXCISION BONE CYST Right 03/06/2015   Procedure: BONE BIOPSIES OF RIGHT FOOT;  Surgeon: [name], MD;  Location: [place];  Service: Podiatry;  Laterality: Right;  . ORIF ANKLE FRACTURE Left 11/06/2014   Procedure: OPEN REDUCTION INTERNAL FIXATION (ORIF) LEFT  ANKLE FRACTURE;  Surgeon: [name], MD;  Location: [place];  Service: Orthopedics;  Laterality: Left;  . TOTAL KNEE ARTHROPLASTY    .  VEIN LIGATION AND STRIPPING      Family History  Problem Relation Age of Onset  . Cancer Mother        breast  . Heart attack Father     Social History:  reports that he quit smoking about 32 years ago. He has a 60.00 pack-year smoking history. He has quit using smokeless tobacco. He reports that he drinks about 0.6 oz of alcohol per week . He reports that he does not use drugs.    Review of Systems        Lipids: Had been on a Lipitor since late 2014. Also had taken Gembrozil for several years without apparent side effects.  Both of these were  stopped because of increased liver functions and LFT has been back to normal  LDL has not been high subsequently but HDL is consistently low Again LDL is below 100   He is taking  fish oil OTC  but triglycerides are high       Lab Results  Component Value Date   CHOL 158 02/17/2017   HDL 25.40 (L) 02/17/2017   LDLCALC 94 02/17/2017   LDLDIRECT 77.0 08/05/2016   TRIG 194.0 (H) 02/17/2017   CHOLHDL 6 02/17/2017      HYPERTENSION: The blood pressure has been treated with various drugs.   Currently taking carvedilol, lisinopril 5 mg and doxazosin 4 mg Twice a day  Chronic kidney disease: Etiology unknown, is followed by nephrologist  Last creatinine:  Lab Results  Component Value Date   CREATININE 2.04 (H) 02/17/2017  Has history of Numbness in his feet  since about 2013, no tingling or burning in feet    He has been wearing diabetic shoes.  He has been followed by podiatrist He is  under the care of a podiatrist for Regular care     Physical Examination:  BP (!) 142/76   Pulse 66   Ht 6\' 4"  (1.93 m)   Wt 290 lb 9.6 oz (131.8 kg)   SpO2 98%   BMI 35.37 kg/m     ASSESSMENT:  Diabetes type 2, uncontrolled with obesity See history of present illness for detailed discussion of current diabetes management, blood sugar patterns and problems identified   His last A1c was  9.1 and is now 8.2  Not clear if he is  still benefiting from using the U-500 insulin because of persistent hyperglycemia and average blood sugar at home still near 200   He probably needs more insulin but also needs to bolus consistently for all meals and especially his bedtime snack but he is not doing Also can benefit from exercise Day-to-day management was discussed in detail   LIPIDS: He will start taking Vascepa if covered, discussed benefits for his triglycerides and also they are regarding cardiovascular benefit.  He will take 2 g twice a day  PLAN:   He will try to bolus consistently with every meal and snack with carbohydrate He will need to try and change his infusion set at least once a week if not more Start exercising at the gym He needs to check blood sugars regularly 4 times a day especially after evening meal   His pump settings were changed in the office today BASAL rates:Midnight = 0.8 ,6 AM = 1.1, 3 PM = 0.9 and 9 PM = 0.7  Hypertension: Blood pressure is controlled  Counseling time on subjects discussed in assessment and plan sections is over 50% of today's 25 minute visit   Patient Instructions  Restart exercise daily  BOLUS FOR bedtime snack   David [name] 02/22/2017, 1:23 PM   Note: This office note was prepared with Estate agent. Any transcriptional errors that result from this process are unintentional.

## 2017-02-23 ENCOUNTER — Telehealth: Payer: Self-pay | Admitting: Endocrinology

## 2017-02-23 ENCOUNTER — Other Ambulatory Visit: Payer: Self-pay

## 2017-02-23 MED ORDER — INSULIN REGULAR HUMAN (CONC) 500 UNIT/ML ~~LOC~~ SOLN: SUBCUTANEOUS | 3 refills | Status: DC

## 2017-02-23 NOTE — Telephone Encounter (Signed)
Pt wife called [facility] stating that pt is trying to get his insulin from [facility] on [address]. Request was sent already on the 8th and yesterday. Medication is HUMULIN R 500 UNIT/ML injection.   Pharmacy is [facility] [address], [name] - [address]  Call pt @ [phone] or  [phone]

## 2017-02-23 NOTE — Telephone Encounter (Signed)
Pt

## 2017-02-23 NOTE — Telephone Encounter (Signed)
Called patient and talked to his wife and let her know that this prescription has been sent to this pharmacy but I sent another one today and hopefully it will be received. If not they will call back in the morning and let us know if [facility] does not have ready for them.

## 2017-02-24 ENCOUNTER — Encounter: Payer: Self-pay | Admitting: Cardiology

## 2017-02-27 ENCOUNTER — Ambulatory Visit (INDEPENDENT_AMBULATORY_CARE_PROVIDER_SITE_OTHER): Payer: Medicare Other | Admitting: *Deleted

## 2017-02-27 ENCOUNTER — Other Ambulatory Visit: Payer: Self-pay

## 2017-02-27 DIAGNOSIS — R7989 Other specified abnormal findings of blood chemistry: Secondary | ICD-10-CM

## 2017-02-27 DIAGNOSIS — E538 Deficiency of other specified B group vitamins: Secondary | ICD-10-CM

## 2017-02-27 MED ORDER — CYANOCOBALAMIN 1000 MCG/ML IJ SOLN
1000.0000 ug | Freq: Once | INTRAMUSCULAR | Status: AC
  Administered 2017-02-27: 1000 ug via INTRAMUSCULAR

## 2017-02-27 MED ORDER — INSULIN REGULAR HUMAN (CONC) 500 UNIT/ML ~~LOC~~ SOLN: SUBCUTANEOUS | 3 refills | Status: DC

## 2017-02-27 NOTE — Progress Notes (Signed)
I have reviewed the patient's encounter and agree with the documentation.  [name], MD 02/27/2017 3:19 PM

## 2017-02-27 NOTE — Progress Notes (Signed)
Pt presented to office for B12 Injection. Pt tolerated well. Pt knows to return in one month for next injection.

## 2017-03-04 ENCOUNTER — Inpatient Hospital Stay (HOSPITAL_BASED_OUTPATIENT_CLINIC_OR_DEPARTMENT_OTHER)
Admission: EM | Admit: 2017-03-04 | Discharge: 2017-03-07 | DRG: 418 | Disposition: A | Discharge status: Home / Self Care | Payer: Medicare Other | Attending: Internal Medicine | Admitting: Internal Medicine

## 2017-03-04 ENCOUNTER — Encounter (HOSPITAL_BASED_OUTPATIENT_CLINIC_OR_DEPARTMENT_OTHER): Payer: Self-pay | Admitting: *Deleted

## 2017-03-04 ENCOUNTER — Emergency Department (HOSPITAL_BASED_OUTPATIENT_CLINIC_OR_DEPARTMENT_OTHER): Payer: Medicare Other

## 2017-03-04 DIAGNOSIS — Z87891 Personal history of nicotine dependence: Secondary | ICD-10-CM | POA: Diagnosis not present

## 2017-03-04 DIAGNOSIS — K8010 Calculus of gallbladder with chronic cholecystitis without obstruction: Secondary | ICD-10-CM | POA: Diagnosis not present

## 2017-03-04 DIAGNOSIS — R1013 Epigastric pain: Secondary | ICD-10-CM | POA: Diagnosis not present

## 2017-03-04 DIAGNOSIS — K429 Umbilical hernia without obstruction or gangrene: Secondary | ICD-10-CM | POA: Diagnosis present

## 2017-03-04 DIAGNOSIS — E785 Hyperlipidemia, unspecified: Secondary | ICD-10-CM | POA: Diagnosis present

## 2017-03-04 DIAGNOSIS — K8012 Calculus of gallbladder with acute and chronic cholecystitis without obstruction: Principal | ICD-10-CM | POA: Diagnosis present

## 2017-03-04 DIAGNOSIS — Z6835 Body mass index (BMI) 35.0-35.9, adult: Secondary | ICD-10-CM | POA: Diagnosis not present

## 2017-03-04 DIAGNOSIS — D631 Anemia in chronic kidney disease: Secondary | ICD-10-CM | POA: Diagnosis present

## 2017-03-04 DIAGNOSIS — Z96659 Presence of unspecified artificial knee joint: Secondary | ICD-10-CM | POA: Diagnosis present

## 2017-03-04 DIAGNOSIS — Z794 Long term (current) use of insulin: Secondary | ICD-10-CM

## 2017-03-04 DIAGNOSIS — Z8249 Family history of ischemic heart disease and other diseases of the circulatory system: Secondary | ICD-10-CM

## 2017-03-04 DIAGNOSIS — N400 Enlarged prostate without lower urinary tract symptoms: Secondary | ICD-10-CM | POA: Diagnosis present

## 2017-03-04 DIAGNOSIS — E1122 Type 2 diabetes mellitus with diabetic chronic kidney disease: Secondary | ICD-10-CM | POA: Diagnosis not present

## 2017-03-04 DIAGNOSIS — K7581 Nonalcoholic steatohepatitis (NASH): Secondary | ICD-10-CM | POA: Diagnosis present

## 2017-03-04 DIAGNOSIS — Z85828 Personal history of other malignant neoplasm of skin: Secondary | ICD-10-CM

## 2017-03-04 DIAGNOSIS — E1169 Type 2 diabetes mellitus with other specified complication: Secondary | ICD-10-CM | POA: Diagnosis present

## 2017-03-04 DIAGNOSIS — N2581 Secondary hyperparathyroidism of renal origin: Secondary | ICD-10-CM | POA: Diagnosis present

## 2017-03-04 DIAGNOSIS — E1159 Type 2 diabetes mellitus with other circulatory complications: Secondary | ICD-10-CM | POA: Diagnosis present

## 2017-03-04 DIAGNOSIS — I2510 Atherosclerotic heart disease of native coronary artery without angina pectoris: Secondary | ICD-10-CM | POA: Diagnosis not present

## 2017-03-04 DIAGNOSIS — E8881 Metabolic syndrome: Secondary | ICD-10-CM | POA: Diagnosis present

## 2017-03-04 DIAGNOSIS — K8000 Calculus of gallbladder with acute cholecystitis without obstruction: Secondary | ICD-10-CM | POA: Diagnosis present

## 2017-03-04 DIAGNOSIS — I421 Obstructive hypertrophic cardiomyopathy: Secondary | ICD-10-CM | POA: Diagnosis not present

## 2017-03-04 DIAGNOSIS — Z7982 Long term (current) use of aspirin: Secondary | ICD-10-CM | POA: Diagnosis not present

## 2017-03-04 DIAGNOSIS — N183 Chronic kidney disease, stage 3 unspecified: Secondary | ICD-10-CM | POA: Diagnosis present

## 2017-03-04 DIAGNOSIS — I129 Hypertensive chronic kidney disease with stage 1 through stage 4 chronic kidney disease, or unspecified chronic kidney disease: Secondary | ICD-10-CM | POA: Diagnosis present

## 2017-03-04 DIAGNOSIS — R52 Pain, unspecified: Secondary | ICD-10-CM | POA: Diagnosis not present

## 2017-03-04 DIAGNOSIS — I10 Essential (primary) hypertension: Secondary | ICD-10-CM

## 2017-03-04 DIAGNOSIS — G4733 Obstructive sleep apnea (adult) (pediatric): Secondary | ICD-10-CM | POA: Diagnosis not present

## 2017-03-04 DIAGNOSIS — Z9641 Presence of insulin pump (external) (internal): Secondary | ICD-10-CM | POA: Diagnosis present

## 2017-03-04 DIAGNOSIS — Z419 Encounter for procedure for purposes other than remedying health state, unspecified: Secondary | ICD-10-CM

## 2017-03-04 DIAGNOSIS — E119 Type 2 diabetes mellitus without complications: Secondary | ICD-10-CM

## 2017-03-04 DIAGNOSIS — E1142 Type 2 diabetes mellitus with diabetic polyneuropathy: Secondary | ICD-10-CM | POA: Diagnosis present

## 2017-03-04 DIAGNOSIS — IMO0001 Reserved for inherently not codable concepts without codable children: Secondary | ICD-10-CM

## 2017-03-04 DIAGNOSIS — E1151 Type 2 diabetes mellitus with diabetic peripheral angiopathy without gangrene: Secondary | ICD-10-CM | POA: Diagnosis not present

## 2017-03-04 DIAGNOSIS — I152 Hypertension secondary to endocrine disorders: Secondary | ICD-10-CM | POA: Diagnosis present

## 2017-03-04 DIAGNOSIS — K8020 Calculus of gallbladder without cholecystitis without obstruction: Secondary | ICD-10-CM | POA: Diagnosis not present

## 2017-03-04 DIAGNOSIS — K819 Cholecystitis, unspecified: Secondary | ICD-10-CM | POA: Diagnosis not present

## 2017-03-04 DIAGNOSIS — Z9989 Dependence on other enabling machines and devices: Secondary | ICD-10-CM

## 2017-03-04 DIAGNOSIS — K810 Acute cholecystitis: Secondary | ICD-10-CM | POA: Diagnosis not present

## 2017-03-04 DIAGNOSIS — K8066 Calculus of gallbladder and bile duct with acute and chronic cholecystitis without obstruction: Secondary | ICD-10-CM | POA: Diagnosis not present

## 2017-03-04 DIAGNOSIS — Z950 Presence of cardiac pacemaker: Secondary | ICD-10-CM | POA: Diagnosis not present

## 2017-03-04 DIAGNOSIS — N184 Chronic kidney disease, stage 4 (severe): Secondary | ICD-10-CM

## 2017-03-04 DIAGNOSIS — R1010 Upper abdominal pain, unspecified: Secondary | ICD-10-CM | POA: Diagnosis not present

## 2017-03-04 HISTORY — DX: Calculus of gallbladder with acute cholecystitis without obstruction: K80.00

## 2017-03-04 HISTORY — DX: Reserved for inherently not codable concepts without codable children: IMO0001

## 2017-03-04 HISTORY — DX: Fracture of one rib, unspecified side, initial encounter for closed fracture: S22.39XA

## 2017-03-04 LAB — BASIC METABOLIC PANEL
Anion gap: 8 (ref 5–15)
BUN: 40 mg/dL — ABNORMAL HIGH (ref 6–20)
CO2: 25 mmol/L (ref 22–32)
Calcium: 9.6 mg/dL (ref 8.9–10.3)
Chloride: 107 mmol/L (ref 101–111)
Creatinine, Ser: 2.19 mg/dL — ABNORMAL HIGH (ref 0.61–1.24)
GFR calc Af Amer: 32 mL/min — ABNORMAL LOW (ref >60)
GFR calc non Af Amer: 28 mL/min — ABNORMAL LOW (ref >60)
Glucose, Bld: 80 mg/dL (ref 65–99)
Potassium: 4.7 mmol/L (ref 3.5–5.1)
Sodium: 140 mmol/L (ref 135–145)

## 2017-03-04 LAB — CBC WITH DIFFERENTIAL/PLATELET
Basophils Absolute: 0 10*3/uL (ref 0.0–0.1)
Basophils Relative: 0 %
Eosinophils Absolute: 0.1 10*3/uL (ref 0.0–0.7)
Eosinophils Relative: 1 %
HCT: 34.8 % — ABNORMAL LOW (ref 39.0–52.0)
Hemoglobin: 11.6 g/dL — ABNORMAL LOW (ref 13.0–17.0)
Lymphocytes Relative: 12 %
Lymphs Abs: 1 10*3/uL (ref 0.7–4.0)
MCH: 31.3 pg (ref 26.0–34.0)
MCHC: 33.3 g/dL (ref 30.0–36.0)
MCV: 93.8 fL (ref 78.0–100.0)
Monocytes Absolute: 0.6 10*3/uL (ref 0.1–1.0)
Monocytes Relative: 7 %
Neutro Abs: 7 10*3/uL (ref 1.7–7.7)
Neutrophils Relative %: 80 %
Platelets: 169 10*3/uL (ref 150–400)
RBC: 3.71 MIL/uL — ABNORMAL LOW (ref 4.22–5.81)
RDW: 14 % (ref 11.5–15.5)
WBC: 8.7 10*3/uL (ref 4.0–10.5)

## 2017-03-04 LAB — URINALYSIS, MICROSCOPIC (REFLEX): Squamous Epithelial / LPF: NONE SEEN

## 2017-03-04 LAB — COMPREHENSIVE METABOLIC PANEL
ALT: 28 U/L (ref 17–63)
AST: 21 U/L (ref 15–41)
Albumin: 3.9 g/dL (ref 3.5–5.0)
Alkaline Phosphatase: 96 U/L (ref 38–126)
Anion gap: 6 (ref 5–15)
BUN: 47 mg/dL — ABNORMAL HIGH (ref 6–20)
CO2: 25 mmol/L (ref 22–32)
Calcium: 9.9 mg/dL (ref 8.9–10.3)
Chloride: 105 mmol/L (ref 101–111)
Creatinine, Ser: 2.19 mg/dL — ABNORMAL HIGH (ref 0.61–1.24)
GFR calc Af Amer: 32 mL/min — ABNORMAL LOW (ref >60)
GFR calc non Af Amer: 28 mL/min — ABNORMAL LOW (ref >60)
Glucose, Bld: 262 mg/dL — ABNORMAL HIGH (ref 65–99)
Potassium: 5.7 mmol/L — ABNORMAL HIGH (ref 3.5–5.1)
Sodium: 136 mmol/L (ref 135–145)
Total Bilirubin: 0.5 mg/dL (ref 0.3–1.2)
Total Protein: 7.3 g/dL (ref 6.5–8.1)

## 2017-03-04 LAB — URINALYSIS, ROUTINE W REFLEX MICROSCOPIC
Bilirubin Urine: NEGATIVE
Glucose, UA: >=500 mg/dL — AB
Ketones, ur: NEGATIVE mg/dL
Leukocytes, UA: NEGATIVE
Nitrite: NEGATIVE
Protein, ur: 30 mg/dL — AB
Specific Gravity, Urine: 1.01 (ref 1.005–1.030)
pH: 6 (ref 5.0–8.0)

## 2017-03-04 LAB — GLUCOSE, CAPILLARY
Glucose-Capillary: 87 mg/dL (ref 65–99)
Glucose-Capillary: 99 mg/dL (ref 65–99)

## 2017-03-04 LAB — LIPASE, BLOOD: Lipase: 29 U/L (ref 11–51)

## 2017-03-04 LAB — CBG MONITORING, ED: Glucose-Capillary: 97 mg/dL (ref 65–99)

## 2017-03-04 LAB — OCCULT BLOOD X 1 CARD TO LAB, STOOL: Fecal Occult Bld: NEGATIVE

## 2017-03-04 MED ORDER — INSULIN ASPART 100 UNIT/ML ~~LOC~~ SOLN: 0.0000 [IU] | Freq: Three times a day (TID) | SUBCUTANEOUS | Status: DC

## 2017-03-04 MED ORDER — ONDANSETRON HCL 4 MG/2ML IJ SOLN
4.0000 mg | Freq: Once | INTRAMUSCULAR | Status: AC
  Administered 2017-03-04: 4 mg via INTRAVENOUS
  Filled 2017-03-04: qty 2

## 2017-03-04 MED ORDER — INSULIN GLARGINE 100 UNIT/ML ~~LOC~~ SOLN
45.0000 [IU] | Freq: Two times a day (BID) | SUBCUTANEOUS | Status: DC
  Filled 2017-03-04 (×2): qty 0.45

## 2017-03-04 MED ORDER — SERTRALINE HCL 50 MG PO TABS
50.0000 mg | ORAL_TABLET | Freq: Every day | ORAL | Status: DC
  Administered 2017-03-05 – 2017-03-07 (×3): 50 mg via ORAL
  Filled 2017-03-04 (×3): qty 1

## 2017-03-04 MED ORDER — INSULIN ASPART 100 UNIT/ML ~~LOC~~ SOLN: 0.0000 [IU] | Freq: Every day | SUBCUTANEOUS | Status: DC

## 2017-03-04 MED ORDER — ISOSORBIDE MONONITRATE ER 60 MG PO TB24
60.0000 mg | ORAL_TABLET | Freq: Every day | ORAL | Status: DC
  Administered 2017-03-05 – 2017-03-07 (×3): 60 mg via ORAL
  Filled 2017-03-04 (×3): qty 1

## 2017-03-04 MED ORDER — ACETAMINOPHEN 325 MG PO TABS: 650.0000 mg | ORAL_TABLET | Freq: Four times a day (QID) | ORAL | Status: DC | PRN

## 2017-03-04 MED ORDER — SODIUM CHLORIDE 0.9 % IV BOLUS (SEPSIS)
1000.0000 mL | Freq: Once | INTRAVENOUS | Status: AC
  Administered 2017-03-04: 1000 mL via INTRAVENOUS

## 2017-03-04 MED ORDER — PANTOPRAZOLE SODIUM 40 MG PO TBEC
40.0000 mg | DELAYED_RELEASE_TABLET | Freq: Every day | ORAL | Status: DC
  Administered 2017-03-05 – 2017-03-07 (×3): 40 mg via ORAL
  Filled 2017-03-04 (×3): qty 1

## 2017-03-04 MED ORDER — INSULIN ASPART 100 UNIT/ML ~~LOC~~ SOLN
0.0000 [IU] | SUBCUTANEOUS | Status: DC
  Administered 2017-03-05 (×2): 3 [IU] via SUBCUTANEOUS
  Administered 2017-03-06: 15 [IU] via SUBCUTANEOUS
  Administered 2017-03-06: 7 [IU] via SUBCUTANEOUS

## 2017-03-04 MED ORDER — ONDANSETRON HCL 4 MG PO TABS: 4.0000 mg | ORAL_TABLET | Freq: Four times a day (QID) | ORAL | Status: DC | PRN

## 2017-03-04 MED ORDER — SODIUM CHLORIDE 0.9 % IV SOLN: INTRAVENOUS | Status: DC

## 2017-03-04 MED ORDER — MORPHINE SULFATE (PF) 4 MG/ML IV SOLN
4.0000 mg | Freq: Once | INTRAVENOUS | Status: AC
  Administered 2017-03-04: 4 mg via INTRAVENOUS
  Filled 2017-03-04: qty 1

## 2017-03-04 MED ORDER — ASPIRIN EC 81 MG PO TBEC
81.0000 mg | DELAYED_RELEASE_TABLET | Freq: Every day | ORAL | Status: DC
  Administered 2017-03-06 – 2017-03-07 (×2): 81 mg via ORAL
  Filled 2017-03-04 (×3): qty 1

## 2017-03-04 MED ORDER — DOXAZOSIN MESYLATE 2 MG PO TABS
4.0000 mg | ORAL_TABLET | Freq: Every day | ORAL | Status: DC
  Administered 2017-03-05 – 2017-03-07 (×3): 4 mg via ORAL
  Filled 2017-03-04: qty 2
  Filled 2017-03-04: qty 4
  Filled 2017-03-04: qty 2

## 2017-03-04 MED ORDER — POLYVINYL ALCOHOL 1.4 % OP SOLN
1.0000 [drp] | OPHTHALMIC | Status: DC | PRN
  Filled 2017-03-04: qty 15

## 2017-03-04 MED ORDER — PIPERACILLIN-TAZOBACTAM 3.375 G IVPB
3.3750 g | Freq: Three times a day (TID) | INTRAVENOUS | Status: DC
  Administered 2017-03-04 – 2017-03-05 (×3): 3.375 g via INTRAVENOUS
  Filled 2017-03-04 (×2): qty 50

## 2017-03-04 MED ORDER — MORPHINE SULFATE (PF) 2 MG/ML IV SOLN: 2.0000 mg | INTRAVENOUS | Status: DC | PRN

## 2017-03-04 MED ORDER — TORSEMIDE 20 MG PO TABS
20.0000 mg | ORAL_TABLET | Freq: Every evening | ORAL | Status: DC
  Administered 2017-03-05 – 2017-03-06 (×2): 20 mg via ORAL
  Filled 2017-03-04 (×4): qty 1

## 2017-03-04 MED ORDER — ACETAMINOPHEN 650 MG RE SUPP: 650.0000 mg | Freq: Four times a day (QID) | RECTAL | Status: DC | PRN

## 2017-03-04 MED ORDER — ISOSORBIDE MONONITRATE ER 60 MG PO TB24: 60.0000 mg | ORAL_TABLET | Freq: Every day | ORAL | Status: DC

## 2017-03-04 MED ORDER — CARVEDILOL 12.5 MG PO TABS
12.5000 mg | ORAL_TABLET | Freq: Two times a day (BID) | ORAL | Status: DC
  Administered 2017-03-05 – 2017-03-07 (×5): 12.5 mg via ORAL
  Filled 2017-03-04 (×5): qty 1

## 2017-03-04 MED ORDER — PIPERACILLIN-TAZOBACTAM 3.375 G IVPB 30 MIN
3.3750 g | Freq: Once | INTRAVENOUS | Status: AC
  Administered 2017-03-04: 3.375 g via INTRAVENOUS
  Filled 2017-03-04 (×2): qty 50

## 2017-03-04 MED ORDER — ONDANSETRON HCL 4 MG/2ML IJ SOLN: 4.0000 mg | Freq: Four times a day (QID) | INTRAMUSCULAR | Status: DC | PRN

## 2017-03-04 NOTE — ED Provider Notes (Addendum)
[facility] EMERGENCY DEPARTMENT Provider Note   CSN: [id number] Arrival date & time: 03/04/17  6812     History   Chief Complaint Chief Complaint  Patient presents with  . Abdominal Pain    HPI HOYLE BARKDULL is a 76 y.o. male.  Pt presents to the ED today with abdominal pain.  Pt said the pain is in the center of his upper abdomen and radiates to the back.  The pt has been having pain for a few days, but it became much worse last night.  On the evening of 10/18-19, pt woke up with blood all over his pillow.  He is unsure if he vomited.  His wife said she looked in his nose and mouth and did not see blood.  He has not had any more blood since then.  He has also had diarrhea for 4 days.  He said it has not been black.      Past Medical History:  Diagnosis Date  . Anemia, iron deficiency   . Anxiety   . Arthritis   . BPH (benign prostatic hypertrophy)   . CAD (coronary artery disease)    Nonobstructive CAD per cath  . Cardiac pacemaker in situ   . CHF (congestive heart failure) ([place])   . Chronic ulcer of right foot ([place])   . CKD (chronic kidney disease), stage III ([place]) secondary to DM and HTN   nephrologist-  [name]  . Dyspnea   . History of cellulitis    right great toe 10-25-2014  . History of skin cancer   . HOCM (hypertrophic obstructive cardiomyopathy) ([place])   . Hypertension   . 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 Insulin pump in place   . OSA on CPAP   . Peripheral neuropathy    severe  . Peripheral vascular disease ([place])    bilateral lower extremities  . Secondary hyperparathyroidism of renal origin ([place])   . Sinus node dysfunction [facility])     Patient Active Problem List   Diagnosis Date Noted  . Fatigue 01/27/2017  . Low vitamin B12 level 01/27/2017  . Chronic ulcer of right foot ([place]) 08/09/2016  . Rib fracture 07/24/2015  . Syncope 07/24/2015  . Sinus node arrhythmia  06/03/2015  . 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 OSA on CPAP 10/26/2014  . Back pain 10/16/2014  . Left leg numbness 10/16/2014  . Episodic lightheadedness 08/04/2014  . Open toe wound 07/18/2014  . Diabetes mellitus with neurological manifestations, uncontrolled ([place]) 02/27/2014  . Seasonal and perennial allergic rhinitis 10/25/2013  . Hypertension associated with diabetes ([place]) 10/25/2013  . Hyperlipidemia associated with type 2 diabetes mellitus ([place]) 10/25/2013  . Obesity 10/25/2013  . CKD stage 3 due to type 2 diabetes mellitus ([place]) 10/25/2013    Past Surgical History:  Procedure Laterality Date  . 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 AMPUTATION TOE Right 03/12/2015   Procedure: RIGHT HALLUS AMPUTATION ;  Surgeon: [name], MD;  Location: [place];  Service: Podiatry;  Laterality: Right;  . CARDIAC CATHETERIZATION  11-25-2010   [place], [place]   Nonobstructive CAD  . CARDIAC PACEMAKER PLACEMENT  Nov 2009   [employer]  . EP IMPLANTABLE DEVICE N/A 06/03/2015   Procedure: PPM Generator Changeout;  Surgeon: [name], MD;  Location: [facility] CV LAB;  Service: Cardiovascular;  Laterality: N/A;  . EXCISION  BONE CYST Right 03/06/2015   Procedure: BONE BIOPSIES OF RIGHT FOOT;  Surgeon: [name], MD;  Location: [place];  Service: Podiatry;  Laterality: Right;  . ORIF ANKLE FRACTURE Left 11/06/2014   Procedure: OPEN REDUCTION INTERNAL FIXATION (ORIF) LEFT  ANKLE FRACTURE;  Surgeon: [name], MD;  Location: [facility];  Service: Orthopedics;  Laterality: Left;  . TOTAL KNEE ARTHROPLASTY    . VEIN LIGATION AND STRIPPING         Home Medications    Prior to Admission medications   Medication Sig Start Date End Date Taking? Authorizing Provider  ACCU-CHEK SOFTCLIX LANCETS lancets USE AS INSTRUCTED TO CHECK BLOOD SUGAR 6 TIMES PER DAY Dx: Code E11.9 12/25/15  Yes Elayne Snare, MD  aspirin EC  81 MG tablet Take 81 mg by mouth daily.   Yes [provider]  calcitRIOL (ROCALTROL) 0.25 MCG capsule Take 0.25 mcg by mouth daily.  05/30/14  Yes [provider]  carvedilol (COREG) 25 MG tablet TAKE ONE TABLET BY MOUTH TWICE DAILY WITH A MEAL 01/02/17  Yes [name], FNP  cholecalciferol (VITAMIN D) 1000 units tablet Take 1 tablet (1,000 Units total) by mouth daily. 02/01/17  Yes [name], MD  doxazosin (CARDURA) 4 MG tablet TAKE ONE TABLET BY MOUTH ONCE DAILY 08/22/16  Yes Elayne Snare, MD  glucose blood (BAYER CONTOUR NEXT TEST) test strip Use to test blood sugar 4 times daily    Dx code is E11.49 11/17/16  Yes Elayne Snare, MD  Icosapent Ethyl (VASCEPA) 1 g CAPS 2 caps bid 02/22/17  Yes Elayne Snare, MD  insulin regular human CONCENTRATED (HUMULIN R) 500 UNIT/ML injection USE 0.25 ML DAILY OR AS DIRECTED IN INSULIN PUMP. Dx code E11.65 02/27/17  Yes Elayne Snare, MD  isosorbide mononitrate (IMDUR) 30 MG 24 hr tablet TAKE TWO TABLETS BY MOUTH ONCE DAILY 11/07/16  Yes [name], MD  liraglutide (VICTOZA) 18 MG/3ML SOPN INJECT 0.3MLS (1.8 MG TOTAL) INTO THE SKIN DAILY 05/23/16  Yes Elayne Snare, MD  lisinopril (PRINIVIL,ZESTRIL) 5 MG tablet Take 5 mg by mouth daily. 07/15/15  Yes [provider]  Omega-3 Fatty Acids (FISH OIL) 1000 MG CAPS Take by mouth 2 (two) times daily.   Yes [provider]  pantoprazole (PROTONIX) 40 MG tablet TAKE ONE TABLET BY MOUTH ONCE DAILY 02/07/17  Yes [name], FNP  REGRANEX 0.01 % gel  12/14/16  Yes [provider]  sertraline (ZOLOFT) 50 MG tablet TAKE ONE TABLET BY MOUTH ONCE DAILY 09/09/16  Yes [name], FNP  torsemide (DEMADEX) 20 MG tablet Take 1 tablets (20 mg) by mouth in the am and 1 tablet (20 mg) in the pm   Yes [provider]    Family History Family History  Problem Relation Age of Onset  . Cancer Mother        breast  . Heart attack Father     Social History Social  History  Substance Use Topics  . Smoking status: Former Smoker    Packs/day: 2.00    Years: 30.00    Quit date: 03/03/1984  . Smokeless tobacco: Former Systems developer  . Alcohol use 0.6 oz/week    1 Glasses of wine per week     Comment: social     Allergies   Patient has no known allergies.   Review of Systems Review of Systems  Gastrointestinal: Positive for abdominal pain, diarrhea and nausea.  All other systems  reviewed and are negative.    Physical Exam Updated Vital Signs BP (!) 139/46   Pulse 62   Temp 98.7 F (37.1 C) (Oral)   Resp 19   Ht 6\' 4"  (1.93 m)   Wt 131.5 kg (290 lb)   SpO2 98%   BMI 35.30 kg/m   Physical Exam  Constitutional: He appears well-developed and well-nourished.  HENT:  Head: Normocephalic and atraumatic.  Right Ear: External ear normal.  Left Ear: External ear normal.  Nose: Nose normal.  Mouth/Throat: Oropharynx is clear and moist.  Eyes: Pupils are equal, round, and reactive to light. Conjunctivae and EOM are normal.  Neck: Normal range of motion. Neck supple.  Cardiovascular: Normal rate, regular rhythm, normal heart sounds and intact distal pulses.   Pulmonary/Chest: Effort normal and breath sounds normal.  Abdominal: Soft. Bowel sounds are normal. There is tenderness in the right upper quadrant and epigastric area.  Genitourinary: Rectal exam shows guaiac negative stool.  Musculoskeletal: Normal range of motion.  Neurological: He is alert.  Skin: Skin is warm and dry. Capillary refill takes less than 2 seconds.  Psychiatric: He has a normal mood and affect. His behavior is normal. Judgment and thought content normal.  Nursing note and vitals reviewed.    ED Treatments / Results  Labs (all labs ordered are listed, but only abnormal results are displayed) Labs Reviewed  CBC WITH DIFFERENTIAL/PLATELET - Abnormal; Notable for the following:       Result Value   RBC 3.71 (*)    Hemoglobin 11.6 (*)    HCT 34.8 (*)    All other  components within normal limits  COMPREHENSIVE METABOLIC PANEL - Abnormal; Notable for the following:    Potassium 5.7 (*)    Glucose, Bld 262 (*)    BUN 47 (*)    Creatinine, Ser 2.19 (*)    GFR calc non Af Amer 28 (*)    GFR calc Af Amer 32 (*)    All other components within normal limits  URINALYSIS, ROUTINE W REFLEX MICROSCOPIC - Abnormal; Notable for the following:    Color, Urine STRAW (*)    Glucose, UA >=500 (*)    Hgb urine dipstick SMALL (*)    Protein, ur 30 (*)    All other components within normal limits  URINALYSIS, MICROSCOPIC (REFLEX) - Abnormal; Notable for the following:    Bacteria, UA RARE (*)    All other components within normal limits  LIPASE, BLOOD  OCCULT BLOOD X 1 CARD TO LAB, STOOL  POC OCCULT BLOOD, ED    EKG  EKG Interpretation None       Radiology Ct Abdomen Pelvis Wo Contrast  Result Date: 03/04/2017 CLINICAL DATA:  Upper abdominal pain with nausea for several days EXAM: CT ABDOMEN AND PELVIS WITHOUT CONTRAST TECHNIQUE: Multidetector CT imaging of the abdomen and pelvis was performed following the standard protocol without IV contrast. COMPARISON:  07/23/2015 FINDINGS: Lower chest: No acute abnormality. Hepatobiliary: The liver is within normal limits. The gallbladder is well distended with some dependent density consistent with small stones or gallbladder sludge. Some suggestion of gallbladder wall thickening is noted although incompletely evaluated on this exam. Ultrasound may be helpful for further evaluation. Pancreas: Stable calcifications in the pancreatic head are noted. No pancreatic mass is seen. No ductal dilatation is noted. Spleen: Normal in size without focal abnormality. Adrenals/Urinary Tract: The adrenal glands are within normal limits. Cystic changes are noted bilaterally. No renal calculi or obstructive changes are seen.  The bladder is well distended. Stomach/Bowel: Scattered diverticular change of the colon is noted without evidence  of diverticulitis. Appendix is within normal limits without inflammatory changes. Vascular/Lymphatic: Aortic atherosclerosis. No enlarged abdominal or pelvic lymph nodes. Reproductive: Prostate is unremarkable. Other: No abdominal wall hernia or abnormality. No abdominopelvic ascites. Musculoskeletal: Degenerative changes of lumbar spine are noted. No acute abnormality is seen. IMPRESSION: Well distended gallbladder with some suggestion of gallbladder wall thickening. Layering density likely related to small stones or sludge is noted as well. Ultrasound may be helpful for further evaluation. Chronic changes without acute abnormality. Electronically Signed   By: Inez Catalina [name]   On: 03/04/2017 11:29   US Abdomen Limited Ruq  Result Date: 03/04/2017 CLINICAL DATA:  Acute abdominal pain and nausea for 2 days. Possible gallbladder wall thickening on CT performed today. EXAM: ULTRASOUND ABDOMEN LIMITED RIGHT UPPER QUADRANT COMPARISON:  03/04/2017 CT FINDINGS: Gallbladder: Gallbladder wall thickening is identified proximally measuring up to 5 mm. Cholelithiasis noted with the largest calculus measuring 9 mm. No pericholecystic fluid identified. No definite sonographic Murphy sign but the patient is on pain medication. Common bile duct: Diameter: 6 mm.  No intrahepatic or extrahepatic biliary dilatation. Liver: No focal lesion identified. Within normal limits in parenchymal echogenicity. Portal vein is patent on color Doppler imaging with normal direction of blood flow towards the liver. IMPRESSION: Proximal gallbladder wall thickening with cholelithiasis, suspicious for acute cholecystitis. No biliary dilatation. Electronically Signed   By: Margarette Canada [name]   On: 03/04/2017 12:41    Procedures Procedures (including critical care time)  Medications Ordered in ED Medications  piperacillin-tazobactam (ZOSYN) IVPB 3.375 g (3.375 g Intravenous New Bag/Given 03/04/17 1315)  sodium chloride 0.9 % bolus 1,000 mL  (0 mLs Intravenous Stopped 03/04/17 1051)  morphine 4 MG/ML injection 4 mg (4 mg Intravenous Given 03/04/17 0933)  ondansetron (ZOFRAN) injection 4 mg (4 mg Intravenous Given 03/04/17 0930)     Initial Impression / Assessment and Plan / ED Course  I have reviewed the triage vital signs and the nursing notes.  Pertinent labs & imaging results that were available during my care of the patient were reviewed by me and considered in my medical decision making (see chart for details).   Pt's pain has improved with pain meds.  Due to results of [name], pt d/w Dr. Lucia Gaskins (surgery).  Due to pt's many medical problems, he does not want to do admit.  He requests the hospitalists admit.  He will consult.  The pt d/w Dr. [name] (triad) who will admit.  There are no inpatient beds at [facility], so pt d/w Dr. Darl Householder (ED) who will accept him for ED to ED transfer to facilitate pt seeing the surgeon.  I did request a bed under Dr. Camelia Eng name (with her permission) to get pt in line for a bed.   Final Clinical Impressions(s) / ED Diagnoses   Final diagnoses:  Pain  Acute cholecystitis    New Prescriptions New Prescriptions   No medications on file     Isla Pence, MD 03/04/17 1345    Isla Pence, MD 03/04/17 1406

## 2017-03-04 NOTE — ED Notes (Signed)
Pt has insulin pump in place.

## 2017-03-04 NOTE — ED Notes (Signed)
Has Insulin Pump in place at RUQ of abd, states has had pump for approx over 1 year. Pt states can manage pump very well, able to change sites as needed and make changes according to CBG results, states checks own CBG's 3 to 4 times per day on a regular basis. States maintains log at home of CBG results.

## 2017-03-04 NOTE — ED Triage Notes (Signed)
Pt reports mid-upper abd pain radiating directly to his back -- states unsure how long. Reports bloody emesis Thursday night. MD at bedside.

## 2017-03-04 NOTE — ED Notes (Signed)
NPO STATUS: last PO fluids - Last PM approx 1800hrs                           Last PO solids - Last PM approx 1800hrs

## 2017-03-04 NOTE — ED Notes (Signed)
Family at bedside. 

## 2017-03-04 NOTE — Progress Notes (Signed)
Pharmacy Antibiotic Note  David [name] is a 76 y.o. male admitted on 03/04/2017 with abdominal pain .  Pharmacy has been consulted for Zosyn dosing. Probable cholecystitis  Plan: Zosyn 3.375gm q8, 4 hr infusion  Height: 6\' 4"  (193 cm) Weight: 290 lb (131.5 kg) IBW/kg (Calculated) : 86.8  Temp (24hrs), Avg:98.4 F (36.9 C), Min:97.9 F (36.6 C), Max:98.7 F (37.1 C)   Recent Labs Lab 03/04/17 0922  WBC 8.7  CREATININE 2.19*    Estimated Creatinine Clearance: 42.5 mL/min (A) (by C-G formula based on SCr of 2.19 mg/dL (H)).    No Known Allergies  Antimicrobials this admission: 10/20 Zosyn >>   Dose adjustments this admission:  Microbiology results: No cx  Thank you for allowing pharmacy to be a part of this patient's care.  Minda Ditto 03/04/2017 6:36 PM

## 2017-03-04 NOTE — ED Notes (Signed)
Report called to Wilhemena Durie  - charge RN at [facility] ED

## 2017-03-04 NOTE — ED Notes (Signed)
Dr. Alphonsa Overall, MD with [facility] paged

## 2017-03-04 NOTE — Procedures (Signed)
Patient has his home CPAP beside and ready for use.  He will place on when ready for bed.

## 2017-03-04 NOTE — ED Notes (Signed)
PT AND FAMILY INSTRUCTED TO REMAIN NPO UNTIL FURTHER NOTICE

## 2017-03-04 NOTE — ED Notes (Signed)
Patient transported to Ultrasound 

## 2017-03-04 NOTE — ED Notes (Signed)
Lamar Laundry, RN spoke with Dr Lucia Gaskins who will see pt upstairs in room 1527. Legrand Como transported pt to room via wc.

## 2017-03-04 NOTE — ED Notes (Signed)
Pharmacy Tech at bedside performing Med Rec procedure

## 2017-03-04 NOTE — H&P (Addendum)
David [name] History and Physical  David [name] WCH:[id number] DOB: 06-01-1940 DOA: 03/04/2017   PCP: David Barrack, MD  Specialists: Followed by Dr. Dwyane [name] who is his endocrinologist.  Followed by Dr. Caryl [name] who is his cardiologist.  Dr. Marval [name] is his nephrologist.  Chief Complaint: Abdominal pain  HPI: David [name] is a 76 y.o. 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  He had some discomfort in his upper abdomen.  Had some nausea.  However he did not seek attention for the symptoms.  This morning he woke up with worsening symptoms.  He had episode of emesis which was bilious.  He had a pain in the upper abdomen which was 8 out of 10 in intensity.  No radiation of the pain.  No precipitating aggravating or relieving factors.  Denies any fever or chills.  Denies any problems urinating.  No blood in the emesis.  Symptoms were so severe that he had to go to the emergency department.  In the emergency department evaluation revealed cholelithiasis with concern for cholecystitis.  Patient was subsequently admitted for further workup and management.  Home Medications: Prior to Admission medications   Medication Sig Start Date End Date Taking? Authorizing Provider  ACCU-CHEK SOFTCLIX LANCETS lancets USE AS INSTRUCTED TO CHECK BLOOD SUGAR 6 TIMES PER DAY Dx: Code E11.9 12/25/15  Yes Elayne Snare, MD  acetaminophen-codeine (TYLENOL #3) 300-30 MG tablet Take 1 tablet by mouth daily as needed. 01/27/17  Yes [provider]  aspirin EC 81 MG tablet Take 81 mg by mouth daily.   Yes [provider]  calcitRIOL (ROCALTROL) 0.25 MCG capsule Take 0.25 mcg by mouth daily.  05/30/14  Yes [provider]  carvedilol  (COREG) 25 MG tablet TAKE ONE TABLET BY MOUTH TWICE DAILY WITH A MEAL 01/02/17  Yes [name], FNP  cholecalciferol (VITAMIN D) 1000 units tablet Take 1 tablet (1,000 Units total) by mouth daily. Patient taking differently: Take 1,000 Units by mouth every evening.  02/01/17  Yes David Barrack, MD  doxazosin (CARDURA) 4 MG tablet TAKE ONE TABLET BY MOUTH ONCE DAILY 08/22/16  Yes Elayne Snare, MD  glucose blood (BAYER CONTOUR NEXT TEST) test strip Use to test blood sugar 4 times daily    Dx code is E11.49 11/17/16  Yes Elayne Snare, MD  hydroxypropyl methylcellulose / hypromellose (ISOPTO TEARS / GONIOVISC) 2.5 % ophthalmic solution Place 1 drop into both eyes as needed for dry eyes.   Yes [provider]  Icosapent Ethyl (VASCEPA) 1 g CAPS 2 caps bid 02/22/17  Yes Elayne Snare, MD  insulin regular human CONCENTRATED (HUMULIN R) 500 UNIT/ML injection USE 0.25 ML DAILY OR AS DIRECTED IN INSULIN PUMP. Dx code E11.65 02/27/17  Yes Elayne Snare, MD  isosorbide mononitrate (IMDUR) 30 MG 24 hr tablet TAKE TWO TABLETS BY MOUTH ONCE DAILY 11/07/16  Yes David Sprang, MD  liraglutide (VICTOZA) 18 MG/3ML SOPN INJECT 0.3MLS (1.8 MG TOTAL) INTO THE SKIN DAILY 05/23/16  Yes Elayne Snare, MD  lisinopril (PRINIVIL,ZESTRIL) 5 MG tablet Take 5 mg by mouth every evening.  07/15/15  Yes [provider]  Omega-3 Fatty Acids (FISH OIL) 1000 MG CAPS Take by mouth 2 (two) times daily.   Yes [provider]  pantoprazole (PROTONIX) 40 MG tablet TAKE ONE TABLET BY MOUTH ONCE DAILY 02/07/17  Yes [name], FNP  REGRANEX 0.01 % gel  12/14/16  Yes [provider]  sertraline (ZOLOFT) 50 MG tablet TAKE ONE TABLET BY MOUTH ONCE DAILY 09/09/16  Yes [name], FNP  torsemide (DEMADEX) 20 MG tablet Take 20 mg by mouth every evening. Take 1 tablets (20 mg) by mouth in the am and 1 tablet (20 mg) in the pm   Yes [provider]    Allergies: No Known Allergies  Past Medical  History: Past Medical History:  Diagnosis Date  . Anemia, iron deficiency   . Anxiety   . Arthritis   . BPH (benign prostatic hypertrophy)   . CAD (coronary artery disease)    Nonobstructive CAD per cath  . Cardiac pacemaker in situ   . CHF (congestive heart failure) ([place])   . Chronic ulcer of right foot ([place])   . CKD (chronic kidney disease), stage III ([place]) secondary to DM and HTN   nephrologist-  [name]  . Dyspnea   . History of cellulitis    right great toe 10-25-2014  . History of skin cancer   . HOCM (hypertrophic obstructive cardiomyopathy) ([place])   . Hypertension   . Insulin dependent type 2 diabetes mellitus ([place]) 1991   followd by dr David [name]--  has insulin pump  . Insulin pump in place   . OSA on CPAP   . Peripheral neuropathy    severe  . Peripheral vascular disease ([place])    bilateral lower extremities  . Secondary hyperparathyroidism of renal origin ([place])   . Sinus node dysfunction [facility])     Past Surgical History:  Procedure Laterality Date  . AMPUTATION OF REPLICATED TOES  Mar 5732   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)  . AMPUTATION TOE Right 03/12/2015   Procedure: RIGHT HALLUS AMPUTATION ;  Surgeon: [name], MD;  Location: [place];  Service: Podiatry;  Laterality: Right;  . CARDIAC CATHETERIZATION  11-25-2010   [place], [place]   Nonobstructive CAD  . CARDIAC PACEMAKER PLACEMENT  Nov 2009   [employer]  . EP IMPLANTABLE DEVICE N/A 06/03/2015   Procedure: PPM Generator Changeout;  Surgeon: David Sprang, MD;  Location: [place];  Service: Cardiovascular;  Laterality: N/A;  . EXCISION BONE CYST Right 03/06/2015   Procedure: BONE BIOPSIES OF RIGHT FOOT;  Surgeon: [name], MD;  Location: [place];  Service: Podiatry;  Laterality: Right;  . ORIF ANKLE FRACTURE Left 11/06/2014   Procedure: OPEN REDUCTION INTERNAL FIXATION (ORIF) LEFT  ANKLE FRACTURE;  Surgeon: [name], MD;  Location: [place];   Service: Orthopedics;  Laterality: Left;  . TOTAL KNEE ARTHROPLASTY    . VEIN LIGATION AND STRIPPING      Social History: Patient lives in [place] with his family.  Quit smoking 30 years ago.  Occasional beer and wine intake but not on a daily basis.  Usually independent with daily activities.   Family History:  Family History  Problem Relation Age of Onset  . Cancer Mother        breast  . Heart attack Father      Review of Systems - History obtained from the patient General ROS: positive for  - fatigue Psychological ROS: negative Ophthalmic ROS: negative ENT ROS: negative Allergy and Immunology ROS: negative Hematological and Lymphatic ROS: negative Endocrine ROS: positive for - insulin pump Respiratory ROS: no  cough, shortness of breath, or wheezing Cardiovascular ROS: no chest pain or dyspnea on exertion Gastrointestinal ROS: As in HPI Genito-Urinary ROS: no dysuria, trouble voiding, or hematuria Musculoskeletal ROS: negative Neurological ROS: no TIA or stroke symptoms Dermatological ROS: negative  Physical Examination  Vitals:   03/04/17 1330 03/04/17 1430 03/04/17 1530 03/04/17 1638  BP: (!) 148/58 (!) 148/54 (!) 132/54 (!) 157/60  Pulse: 65 64 (!) 59 (!) 58  Resp: 16 17 16 16   Temp:    97.9 F (36.6 C)  TempSrc:    Oral  SpO2: 97% 98% 97% 98%  Weight:      Height:        BP (!) 157/60 (BP Location: Right Arm)   Pulse (!) 58   Temp 97.9 F (36.6 C) (Oral)   Resp 16   Ht 6\' 4"  (1.93 m)   Wt 131.5 kg (290 lb)   SpO2 98%   BMI 35.30 kg/m   General appearance: alert, cooperative, appears stated age, no distress and morbidly obese Head: Normocephalic, without obvious abnormality, atraumatic Eyes: conjunctivae/corneas clear. PERRL, EOM's intact.  Throat: lips, mucosa, and tongue normal; teeth and gums normal Neck: no adenopathy, no carotid bruit, no JVD, supple, symmetrical, trachea midline and thyroid not enlarged, symmetric, no  tenderness/mass/nodules Resp: clear to auscultation bilaterally Cardio: regular rate and rhythm, S1, S2 normal, no murmur, click, rub or gallop GI: soft, non-tender; bowel sounds normal; no masses,  no organomegaly Extremities: edema 1+ pitting edema bilateral lower extremity Pulses: 2+ and symmetric Skin: Hyperpigmentation noted in the lower extremities Lymph nodes: Cervical, supraclavicular, and axillary nodes normal. Neurologic: Awake and alert.  Oriented x3.  No focal neurological deficits.   Labs on Admission: I have personally reviewed following labs and imaging studies  CBC:  Recent Labs Lab 03/04/17 0922  WBC 8.7  NEUTROABS 7.0  HGB 11.6*  HCT 34.8*  MCV 93.8  PLT 732   Basic Metabolic Panel:  Recent Labs Lab 03/04/17 0922  NA 136  K 5.7*  CL 105  CO2 25  GLUCOSE 262*  BUN 47*  CREATININE 2.19*  CALCIUM 9.9   GFR: Estimated Creatinine Clearance: 42.5 mL/min (A) (by C-G formula based on SCr of 2.19 mg/dL (H)). Liver Function Tests:  Recent Labs Lab 03/04/17 0922  AST 21  ALT 28  ALKPHOS 96  BILITOT 0.5  PROT 7.3  ALBUMIN 3.9    Recent Labs Lab 03/04/17 0922  LIPASE 29    Recent Labs Lab 03/04/17 1705  GLUCAP 97    Radiological Exams on Admission: Ct Abdomen Pelvis Wo Contrast  Result Date: 03/04/2017 CLINICAL DATA:  Upper abdominal pain with nausea for several days EXAM: CT ABDOMEN AND PELVIS WITHOUT CONTRAST TECHNIQUE: Multidetector CT imaging of the abdomen and pelvis was performed following the standard protocol without IV contrast. COMPARISON:  07/23/2015 FINDINGS: Lower chest: No acute abnormality. Hepatobiliary: The liver is within normal limits. The gallbladder is well distended with some dependent density consistent with small stones or gallbladder sludge. Some suggestion of gallbladder wall thickening is noted although incompletely evaluated on this exam. Ultrasound may be helpful for further evaluation. Pancreas: Stable  calcifications in the pancreatic head are noted. No pancreatic mass is seen. No ductal dilatation is noted. Spleen: Normal in size without focal abnormality. Adrenals/Urinary Tract: The adrenal glands are within normal limits. Cystic changes are noted bilaterally. No renal calculi or obstructive changes are seen. The bladder is well distended. Stomach/Bowel: Scattered diverticular change of the colon is noted  without evidence of diverticulitis. Appendix is within normal limits without inflammatory changes. Vascular/Lymphatic: Aortic atherosclerosis. No enlarged abdominal or pelvic lymph nodes. Reproductive: Prostate is unremarkable. Other: No abdominal wall hernia or abnormality. No abdominopelvic ascites. Musculoskeletal: Degenerative changes of lumbar spine are noted. No acute abnormality is seen. IMPRESSION: Well distended gallbladder with some suggestion of gallbladder wall thickening. Layering density likely related to small stones or sludge is noted as well. Ultrasound may be helpful for further evaluation. Chronic changes without acute abnormality. Electronically Signed   By: Inez Catalina [name]   On: 03/04/2017 11:29   US Abdomen Limited Ruq  Result Date: 03/04/2017 CLINICAL DATA:  Acute abdominal pain and nausea for 2 days. Possible gallbladder wall thickening on CT performed today. EXAM: ULTRASOUND ABDOMEN LIMITED RIGHT UPPER QUADRANT COMPARISON:  03/04/2017 CT FINDINGS: Gallbladder: Gallbladder wall thickening is identified proximally measuring up to 5 mm. Cholelithiasis noted with the largest calculus measuring 9 mm. No pericholecystic fluid identified. No definite sonographic Murphy sign but the patient is on pain medication. Common bile duct: Diameter: 6 mm.  No intrahepatic or extrahepatic biliary dilatation. Liver: No focal lesion identified. Within normal limits in parenchymal echogenicity. Portal vein is patent on color Doppler imaging with normal direction of blood flow towards the liver.  IMPRESSION: Proximal gallbladder wall thickening with cholelithiasis, suspicious for acute cholecystitis. No biliary dilatation. Electronically Signed   By: Margarette Canada [name]   On: 03/04/2017 12:41    My interpretation of Electrocardiogram: EKG is pending   Problem List  Principal Problem:   Acute cholecystitis Active Problems:   CKD stage 3 due to type 2 diabetes mellitus (HCC)   OSA on CPAP   Pacemaker   HOCM (hypertrophic obstructive cardiomyopathy) (HCC)   IDDM (insulin dependent diabetes mellitus) ([place])   Assessment: This is a 76 year old Caucasian male with past medical history as stated earlier presents with abdominal pain and is found to have cholelithiasis with concern for cholecystitis.  Patient has normal LFTs.  Lipase is normal.  Plan: #1 cholelithiasis with concern for acute cholecystitis: His pain has resolved with pain medications.  He could have experienced biliary colic.  His LFTs are normal.  General surgery has been consulted.  He will likely need cholecystectomy at some point in time.  However there is no urgent indication for same.  He will need to be seen by cardiology prior to surgery.    #2  History of hypertrophic cardiomyopathy with history of sinus node dysfunction status post pacemaker: Is followed by cardiology.  He will need to be seen by cardiology prior to his surgery.  They have been consulted.  EKG will be done.  #3  History of nonobstructive CAD: Last cardiac catheterization in 2012 as per cardiology notes.  He does not have any stents.  Has not required any angioplasty.  Continue with his home medications including aspirin beta blocker and nitrates.  #4  History of chronic kidney disease stage III with hyperkalemia: Renal function appears to be close to baseline.  Potassium level was noted to be elevated this morning.  Unclear if any intervention was provided in the emergency department.  Basic metabolic panel will be repeated tonight.  We will give him  IV fluids gently.  Continue with his home medications.  #5  History of insulin-dependent diabetes mellitus on insulin pump: He is followed by endocrinology.  Since he will be n.p.o. we will take him off of the pump to avoid significant fluctuations including significant hypoglycemia.  He will  be placed on sliding scale coverage.  He will also be placed on Lantus insulin which he has taken previously.  Of note his A1c was 8.2 on October 5.  #6 obstructive sleep apnea on CPAP: Continue with CPAP here.  #7  Morbid obesity: Body mass index is 35.3 kg/m.   #8 normocytic anemia: Hemoglobin appears to be stable.  Continue to monitor.   DVT Prophylaxis: SCDs Code Status: Full code Family Communication: Discussed with the patient and his wife Consults called: General surgery.  Cardiology  Severity of Illness: The appropriate patient status for this patient is OBSERVATION. Observation status is judged to be reasonable and necessary in order to provide the required intensity of service to ensure the patient's safety. The patient's presenting symptoms, physical exam findings, and initial radiographic and laboratory data in the context of their medical condition is felt to place them at decreased risk for further clinical deterioration. Furthermore, it is anticipated that the patient will be medically stable for discharge from the hospital within 2 midnights of admission. The following factors support the patient status of observation.   " The patient's presenting symptoms include abdominal pain nausea and vomiting. " The physical exam findings include nominal tenderness. " The initial radiographic and laboratory data are cholelithiasis.  Further management decisions will depend on results of further testing and patient's response to treatment.   [facility]  David [name] Pager [phone]  If 7PM-7AM, please contact night-coverage [email or web address] Password [email or web address]  03/04/2017, 6:26 PM

## 2017-03-04 NOTE — Consult Note (Signed)
Re:   David [name] DOB:   1941/01/28 MRN:   [id number]   General Surgery Consultation [facility]  Chief Complaint Abdominal [name]  ASSESEMENT AND PLAN: 1.  Cholecysititis, cholelithiasis  Though he is asymptomatic when I saw him.   I discussed with the patient the indications and risks of gall bladder surgery.  The primary risks of gall bladder surgery include, but are not limited to, bleeding, infection, common bile duct injury, and open surgery.    We discussed the typical post-operative recovery course. I tried to answer the patient's questions.  Will await cardiac clearance.  Discussed timing of surgery with family.   2.  DM, type 2  Followed by Dr. Dwyane Dee  On a pump 3.  CAD  Cardiomyopathy  Cardiology to see pre op for "clearance". 4.  Pacemaker  Followed by Dr. Olin Pia 5.  CKD  Creatinine - 2.19 - 03/04/2017  Sees Dr. Arty Baumgartner 6.  Lost toes on right foot. 7.  OSA  Seen by Dr. Glynn Octave on 09/15/2016  8.  Obese  BMI around 34  Metabolic syndrome  Chief Complaint  Patient presents with  . Abdominal [name]   PHYSICIAN REQUESTING CONSULTATION:  Dr. Isla Pence HISTORY OF PRESENT ILLNESS: David [name] is a 76 y.o. (DOB: 04-04-1941)  white male whose primary care physician is David [name], [name], MD. Wife, son, and daughter, in room with patient.   He has no chronic GI issues.  Though over the last 2 to 3 weeks, he has had some diarrhea.  They have treated this at home.  Then more recently, he has had grumbling and lots of gas.  He developed severe epigastric abdominal [name] last pm, that radiated somewhat to his right side.  He has not prior history of abdominal surgery.  CT scan - 03/04/2017 - Well distended gallbladder with some suggestion of gallbladder wall thickening. Layering density likely related to small stones or sludge is noted as well. Ultrasound may be helpful for further evaluation.  Chronic changes without acute abnormality. [name] of abdomen - 03/04/2017 -  Proximal gallbladder wall thickening with cholelithiasis, suspicious for acute cholecystitis. No biliary dilatation.   Past Medical History:  Diagnosis Date  . Anemia, iron deficiency   . Anxiety   . Arthritis   . BPH (benign prostatic hypertrophy)   . CAD (coronary artery disease)    Nonobstructive CAD per cath  . Cardiac pacemaker in situ   . CHF (congestive heart failure) ([place])   . Chronic ulcer of right foot ([place])   . CKD (chronic kidney disease), stage III ([place]) secondary to DM and HTN   nephrologist-  [name]  . Dyspnea   . History of cellulitis    right great toe 10-25-2014  . History of skin cancer   . HOCM (hypertrophic obstructive cardiomyopathy) ([place])   . Hypertension   . Insulin dependent type 2 diabetes mellitus ([place]) 1991   followd by [name]--  has insulin pump  . Insulin pump in place   . OSA on CPAP   . Peripheral neuropathy    severe  . Peripheral vascular disease ([place])    bilateral lower extremities  . Secondary hyperparathyroidism of renal origin ([place])   . Sinus node dysfunction [facility])       Past Surgical History:  Procedure Laterality Date  . AMPUTATION OF REPLICATED TOES  Mar 2355   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)  . AMPUTATION TOE Right 03/12/2015   Procedure: RIGHT HALLUS AMPUTATION ;  Surgeon: [name], MD;  Location: [facility];  Service: Podiatry;  Laterality: Right;  . CARDIAC CATHETERIZATION  11-25-2010   [place], [place]   Nonobstructive CAD  . CARDIAC PACEMAKER PLACEMENT  Nov 2009   [employer]tr[employer]  . EP IMPLANTABLE DEVICE N/A 06/03/2015   Procedure: PPM Generator Changeout;  Surgeon: [name], MD;  Location: [place] [facility];  Service: Cardiovascular;  Laterality: N/A;  . EXCISION BONE CYST Right 03/06/2015   Procedure: BONE BIOPSIES OF RIGHT FOOT;  Surgeon: [name], MD;  Location: [place];  Service: Podiatry;  Laterality: Right;  . ORIF ANKLE FRACTURE Left 11/06/2014   Procedure: OPEN  REDUCTION INTERNAL FIXATION (ORIF) LEFT  ANKLE FRACTURE;  Surgeon: [name], MD;  Location: [place];  Service: Orthopedics;  Laterality: Left;  . TOTAL KNEE ARTHROPLASTY    . VEIN LIGATION AND STRIPPING        No current facility-administered medications for this encounter.    Current Outpatient Prescriptions  Medication Sig Dispense Refill  . ACCU-CHEK SOFTCLIX LANCETS lancets USE AS INSTRUCTED TO CHECK BLOOD SUGAR 6 TIMES PER DAY Dx: Code E11.9 300 each 2  . acetaminophen-codeine (TYLENOL #3) 300-30 MG tablet Take 1 tablet by mouth daily as needed.    Marland Kitchen aspirin EC 81 MG tablet Take 81 mg by mouth daily.    . calcitRIOL (ROCALTROL) 0.25 MCG capsule Take 0.25 mcg by mouth daily.     . carvedilol (COREG) 25 MG tablet TAKE ONE TABLET BY MOUTH TWICE DAILY WITH A MEAL 90 tablet 1  . cholecalciferol (VITAMIN D) 1000 units tablet Take 1 tablet (1,000 Units total) by mouth daily. (Patient taking differently: Take 1,000 Units by mouth every evening. ) 30 tablet 1  . doxazosin (CARDURA) 4 MG tablet TAKE ONE TABLET BY MOUTH ONCE DAILY 90 tablet 0  . glucose blood (BAYER CONTOUR NEXT TEST) test strip Use to test blood sugar 4 times daily    Dx code is E11.49 150 each 3  . hydroxypropyl methylcellulose / hypromellose (ISOPTO TEARS / GONIOVISC) 2.5 % ophthalmic solution Place 1 drop into both eyes as needed for dry eyes.    Vanessa Kick Ethyl (VASCEPA) 1 g CAPS 2 caps bid 120 capsule 2  . insulin regular human CONCENTRATED (HUMULIN R) 500 UNIT/ML injection USE 0.25 ML DAILY OR AS DIRECTED IN INSULIN PUMP. Dx code E11.65 20 mL 3  . isosorbide mononitrate (IMDUR) 30 MG 24 hr tablet TAKE TWO TABLETS BY MOUTH ONCE DAILY 60 tablet 11  . liraglutide (VICTOZA) 18 MG/3ML SOPN INJECT 0.3MLS (1.8 MG TOTAL) INTO THE SKIN DAILY 9 pen 1  . lisinopril (PRINIVIL,ZESTRIL) 5 MG tablet Take 5 mg by mouth every evening.     . Omega-3 Fatty Acids (FISH OIL) 1000 MG CAPS Take by mouth 2 (two) times daily.     . pantoprazole (PROTONIX) 40 MG tablet TAKE ONE TABLET BY MOUTH ONCE DAILY 90 tablet 1  . REGRANEX 0.01 % gel     . sertraline (ZOLOFT) 50 MG tablet TAKE ONE TABLET BY MOUTH ONCE DAILY 90 tablet 2  . torsemide (DEMADEX) 20 MG tablet Take 20 mg by mouth every evening. Take 1 tablets (20 mg) by mouth in the am and 1 tablet (20 mg) in the pm       No Known Allergies  REVIEW OF SYSTEMS: Skin:  No history of rash.  No history of abnormal moles. Infection:  No history of hepatitis or HIV.  No history of MRSA. Neurologic:  No history of stroke.  No history of seizure.  No history of headaches. Cardiac:  Pacemaker - followed by Dr. Olin Pia.  Hypertension Pulmonary:  OSA on CPAP x 10 years.  Followed by Dr. Glynn Octave  Endocrine:  DM, on insulin, followed by Dr. Dwyane Dee Gastrointestinal:  See HPI Urologic:  CKD - followed by Dr. Arty Baumgartner Musculoskeletal:  Amputated digits of right foot Hematologic:  No bleeding disorder.  No history of anemia.  Not anticoagulated. Psycho-social:  The patient is oriented.   The patient has no obvious psychologic or social impairment to understanding our conversation and plan.  SOCIAL and FAMILY HISTORY: Married. Wife -  [name] DAughter - Ledell Noss Son - Myrene Galas, [place] Almyra Free  Mother had Gall bladder disease.  PHYSICAL EXAM: BP (!) 157/60 (BP Location: Right Arm)   Pulse (!) 58   Temp 97.9 F (36.6 C) (Oral)   Resp 16   Ht 6\' 4"  (1.93 m)   Wt 131.5 kg (290 lb)   SpO2 98%   BMI 35.30 kg/m   General: Obese WM who is alert and generally healthy appearing.  He is having no [name] right now. Skin:  Inspection and palpation - no mass or rash. Eyes:  Conjunctiva and lids unremarkable.            Pupils are equal Ears, Nose, Mouth, and Throat:  Ears and nose unremarkable            Lips and teeth are unremarable. Neck: Supple. No mass, trachea midline.  No thyroid mass. Lymph Nodes:  No supraclavicular, cervical, or inguinal nodes.  Lungs:  Normal respiratory effort.  Clear to auscultation and symmetric breath sounds. Heart:  Palpation of the heart is normal.            Auscultation: RRR. No murmur or rub.  Pacemaker in left upper chest  Abdomen: Obese.  Apple shaped. Soft. No mass. No tenderness. No hernia.             Normal bowel sounds.  No abdominal scars.  Insulin pump in place. Rectal: Not done. Musculoskeletal:  Good muscle strength and ROM  in upper and lower extremities. Neurologic:  Grossly intact to motor and sensory function. Psychiatric: Normal judgement and insight. Behavior is normal.            Oriented to time, person, place.   DATA REVIEWED, COUNSELING AND COORDINATION OF CARE: Epic notes reviewed. Counseling and coordination of care exceeded more than 50% of the time spent with patient. Total time spent with patient and charting: 45 minutes  Alphonsa Overall, MD,  [facility], [address]  [place], [place]    [place] Phone:  [phone] FAX:  [phone]

## 2017-03-04 NOTE — ED Notes (Signed)
Pt rec from [facility] via [employer], here for surgical consult by [facility]. Pt identified by armband and verbal statement by patient him-self. Chart reviewed, vital signs obtained and reviewed, hx reviewed as well. Assessment noted by this RN

## 2017-03-05 ENCOUNTER — Encounter (HOSPITAL_COMMUNITY)
Admission: EM | Disposition: A | Discharge status: Home / Self Care | Payer: Self-pay | Source: Home / Self Care | Attending: Internal Medicine

## 2017-03-05 ENCOUNTER — Inpatient Hospital Stay (HOSPITAL_COMMUNITY): Payer: Medicare Other | Admitting: Certified Registered Nurse Anesthetist

## 2017-03-05 ENCOUNTER — Inpatient Hospital Stay (HOSPITAL_COMMUNITY): Payer: Medicare Other

## 2017-03-05 ENCOUNTER — Encounter (HOSPITAL_COMMUNITY): Payer: Self-pay | Admitting: Anesthesiology

## 2017-03-05 DIAGNOSIS — E1169 Type 2 diabetes mellitus with other specified complication: Secondary | ICD-10-CM

## 2017-03-05 DIAGNOSIS — E785 Hyperlipidemia, unspecified: Secondary | ICD-10-CM

## 2017-03-05 DIAGNOSIS — E1159 Type 2 diabetes mellitus with other circulatory complications: Secondary | ICD-10-CM

## 2017-03-05 DIAGNOSIS — Z950 Presence of cardiac pacemaker: Secondary | ICD-10-CM

## 2017-03-05 DIAGNOSIS — I10 Essential (primary) hypertension: Secondary | ICD-10-CM

## 2017-03-05 HISTORY — PX: CHOLECYSTECTOMY: SHX55

## 2017-03-05 LAB — COMPREHENSIVE METABOLIC PANEL
ALT: 22 U/L (ref 17–63)
AST: 16 U/L (ref 15–41)
Albumin: 3.2 g/dL — ABNORMAL LOW (ref 3.5–5.0)
Alkaline Phosphatase: 82 U/L (ref 38–126)
Anion gap: 5 (ref 5–15)
BUN: 40 mg/dL — ABNORMAL HIGH (ref 6–20)
CO2: 25 mmol/L (ref 22–32)
Calcium: 8.9 mg/dL (ref 8.9–10.3)
Chloride: 109 mmol/L (ref 101–111)
Creatinine, Ser: 2.24 mg/dL — ABNORMAL HIGH (ref 0.61–1.24)
GFR calc Af Amer: 31 mL/min — ABNORMAL LOW (ref >60)
GFR calc non Af Amer: 27 mL/min — ABNORMAL LOW (ref >60)
Glucose, Bld: 161 mg/dL — ABNORMAL HIGH (ref 65–99)
Potassium: 5.9 mmol/L — ABNORMAL HIGH (ref 3.5–5.1)
Sodium: 139 mmol/L (ref 135–145)
Total Bilirubin: 0.5 mg/dL (ref 0.3–1.2)
Total Protein: 6.1 g/dL — ABNORMAL LOW (ref 6.5–8.1)

## 2017-03-05 LAB — CBC
HCT: 30.4 % — ABNORMAL LOW (ref 39.0–52.0)
Hemoglobin: 10 g/dL — ABNORMAL LOW (ref 13.0–17.0)
MCH: 30.9 pg (ref 26.0–34.0)
MCHC: 32.9 g/dL (ref 30.0–36.0)
MCV: 93.8 fL (ref 78.0–100.0)
Platelets: 159 10*3/uL (ref 150–400)
RBC: 3.24 MIL/uL — ABNORMAL LOW (ref 4.22–5.81)
RDW: 14.3 % (ref 11.5–15.5)
WBC: 8.3 10*3/uL (ref 4.0–10.5)

## 2017-03-05 LAB — SURGICAL PCR SCREEN
MRSA, PCR: NEGATIVE
Staphylococcus aureus: NEGATIVE

## 2017-03-05 LAB — HEMOGLOBIN A1C
Hgb A1c MFr Bld: 7.8 % — ABNORMAL HIGH (ref 4.8–5.6)
Mean Plasma Glucose: 177.16 mg/dL

## 2017-03-05 LAB — GLUCOSE, CAPILLARY
Glucose-Capillary: 147 mg/dL — ABNORMAL HIGH (ref 65–99)
Glucose-Capillary: 141 mg/dL — ABNORMAL HIGH (ref 65–99)
Glucose-Capillary: 268 mg/dL — ABNORMAL HIGH (ref 65–99)
Glucose-Capillary: 278 mg/dL — ABNORMAL HIGH (ref 65–99)

## 2017-03-05 LAB — POTASSIUM: Potassium: 5.8 mmol/L — ABNORMAL HIGH (ref 3.5–5.1)

## 2017-03-05 SURGERY — LAPAROSCOPIC CHOLECYSTECTOMY WITH INTRAOPERATIVE CHOLANGIOGRAM
Anesthesia: General | Site: Abdomen

## 2017-03-05 MED ORDER — METOCLOPRAMIDE HCL 5 MG/ML IJ SOLN: 5.0000 mg | Freq: Four times a day (QID) | INTRAMUSCULAR | Status: DC | PRN

## 2017-03-05 MED ORDER — ACETAMINOPHEN 500 MG PO TABS
1000.0000 mg | ORAL_TABLET | ORAL | Status: AC
  Administered 2017-03-05: 1000 mg via ORAL
  Filled 2017-03-05: qty 2

## 2017-03-05 MED ORDER — BUPIVACAINE-EPINEPHRINE (PF) 0.25% -1:200000 IJ SOLN
INTRAMUSCULAR | Status: AC
  Filled 2017-03-05: qty 30

## 2017-03-05 MED ORDER — METOPROLOL TARTRATE 5 MG/5ML IV SOLN
5.0000 mg | Freq: Four times a day (QID) | INTRAVENOUS | Status: DC | PRN
  Administered 2017-03-06: 5 mg via INTRAVENOUS
  Filled 2017-03-05: qty 5

## 2017-03-05 MED ORDER — TRAMADOL HCL 50 MG PO TABS
50.0000 mg | ORAL_TABLET | Freq: Four times a day (QID) | ORAL | Status: DC | PRN
  Administered 2017-03-06: 100 mg via ORAL
  Filled 2017-03-05: qty 2
  Filled 2017-03-05: qty 1

## 2017-03-05 MED ORDER — CHLORHEXIDINE GLUCONATE CLOTH 2 % EX PADS
6.0000 | MEDICATED_PAD | Freq: Once | CUTANEOUS | Status: AC
  Administered 2017-03-05: 6 via TOPICAL

## 2017-03-05 MED ORDER — ALBUTEROL SULFATE (2.5 MG/3ML) 0.083% IN NEBU: 2.5000 mg | INHALATION_SOLUTION | RESPIRATORY_TRACT | Status: DC | PRN

## 2017-03-05 MED ORDER — ROCURONIUM BROMIDE 10 MG/ML (PF) SYRINGE
PREFILLED_SYRINGE | INTRAVENOUS | Status: DC | PRN
  Administered 2017-03-05: 10 mg via INTRAVENOUS
  Administered 2017-03-05: 5 mg via INTRAVENOUS
  Administered 2017-03-05: 45 mg via INTRAVENOUS
  Administered 2017-03-05: 10 mg via INTRAVENOUS

## 2017-03-05 MED ORDER — HYDROMORPHONE HCL-NACL 0.5-0.9 MG/ML-% IV SOSY: 0.2500 mg | PREFILLED_SYRINGE | INTRAVENOUS | Status: DC | PRN

## 2017-03-05 MED ORDER — LIP MEDEX EX OINT
1.0000 "application " | TOPICAL_OINTMENT | Freq: Two times a day (BID) | CUTANEOUS | Status: DC
  Administered 2017-03-05 – 2017-03-06 (×4): 1 via TOPICAL
  Filled 2017-03-05: qty 7

## 2017-03-05 MED ORDER — PIPERACILLIN-TAZOBACTAM 3.375 G IVPB
3.3750 g | Freq: Three times a day (TID) | INTRAVENOUS | Status: DC
  Administered 2017-03-05 – 2017-03-06 (×2): 3.375 g via INTRAVENOUS
  Filled 2017-03-05 (×2): qty 50

## 2017-03-05 MED ORDER — GLUCERNA SHAKE PO LIQD
237.0000 mL | Freq: Two times a day (BID) | ORAL | Status: DC
  Administered 2017-03-06: 237 mL via ORAL
  Filled 2017-03-05 (×4): qty 237

## 2017-03-05 MED ORDER — LACTATED RINGERS IV SOLN: INTRAVENOUS | Status: DC

## 2017-03-05 MED ORDER — PIPERACILLIN-TAZOBACTAM 3.375 G IVPB
INTRAVENOUS | Status: AC
  Filled 2017-03-05: qty 50

## 2017-03-05 MED ORDER — HYDROCORTISONE 1 % EX CREA: 1.0000 "application " | TOPICAL_CREAM | Freq: Three times a day (TID) | CUTANEOUS | Status: DC | PRN

## 2017-03-05 MED ORDER — BUPIVACAINE-EPINEPHRINE 0.25% -1:200000 IJ SOLN
INTRAMUSCULAR | Status: AC
  Filled 2017-03-05: qty 1

## 2017-03-05 MED ORDER — ACETAMINOPHEN 500 MG PO TABS
1000.0000 mg | ORAL_TABLET | Freq: Three times a day (TID) | ORAL | Status: DC
  Administered 2017-03-05 – 2017-03-07 (×5): 1000 mg via ORAL
  Filled 2017-03-05 (×5): qty 2

## 2017-03-05 MED ORDER — PROPOFOL 10 MG/ML IV BOLUS
INTRAVENOUS | Status: DC | PRN
  Administered 2017-03-05: 150 mg via INTRAVENOUS

## 2017-03-05 MED ORDER — SODIUM CHLORIDE 0.9 % IV SOLN
INTRAVENOUS | Status: DC | PRN
  Administered 2017-03-05: 11 mL

## 2017-03-05 MED ORDER — 0.9 % SODIUM CHLORIDE (POUR BTL) OPTIME
TOPICAL | Status: DC | PRN
  Administered 2017-03-05: 1000 mL

## 2017-03-05 MED ORDER — PHENYLEPHRINE HCL 10 MG/ML IJ SOLN
INTRAMUSCULAR | Status: AC
  Filled 2017-03-05: qty 3

## 2017-03-05 MED ORDER — LIDOCAINE 2% (20 MG/ML) 5 ML SYRINGE
INTRAMUSCULAR | Status: AC
  Filled 2017-03-05: qty 5

## 2017-03-05 MED ORDER — DIPHENHYDRAMINE HCL 50 MG/ML IJ SOLN: 12.5000 mg | Freq: Four times a day (QID) | INTRAMUSCULAR | Status: DC | PRN

## 2017-03-05 MED ORDER — SUCCINYLCHOLINE CHLORIDE 200 MG/10ML IV SOSY
PREFILLED_SYRINGE | INTRAVENOUS | Status: AC
  Filled 2017-03-05: qty 10

## 2017-03-05 MED ORDER — GUAIFENESIN-DM 100-10 MG/5ML PO SYRP: 10.0000 mL | ORAL_SOLUTION | ORAL | Status: DC | PRN

## 2017-03-05 MED ORDER — HYDROCORTISONE 2.5 % RE CREA
1.0000 "application " | TOPICAL_CREAM | Freq: Four times a day (QID) | RECTAL | Status: DC | PRN
  Filled 2017-03-05: qty 28.35

## 2017-03-05 MED ORDER — INSULIN ASPART 100 UNIT/ML ~~LOC~~ SOLN
6.0000 [IU] | Freq: Once | SUBCUTANEOUS | Status: AC
  Administered 2017-03-05: 6 [IU] via SUBCUTANEOUS

## 2017-03-05 MED ORDER — LACTATED RINGERS IV SOLN
INTRAVENOUS | Status: DC | PRN
  Administered 2017-03-05 (×2): via INTRAVENOUS

## 2017-03-05 MED ORDER — MAGIC MOUTHWASH
15.0000 mL | Freq: Four times a day (QID) | ORAL | Status: DC | PRN
  Filled 2017-03-05: qty 15

## 2017-03-05 MED ORDER — BUPIVACAINE-EPINEPHRINE 0.25% -1:200000 IJ SOLN
INTRAMUSCULAR | Status: DC | PRN
  Administered 2017-03-05: 80 mL

## 2017-03-05 MED ORDER — IOPAMIDOL (ISOVUE-300) INJECTION 61%
INTRAVENOUS | Status: AC
  Filled 2017-03-05: qty 50

## 2017-03-05 MED ORDER — CHLORHEXIDINE GLUCONATE CLOTH 2 % EX PADS: 6.0000 | MEDICATED_PAD | Freq: Once | CUTANEOUS | Status: DC

## 2017-03-05 MED ORDER — NAPHAZOLINE-GLYCERIN 0.012-0.2 % OP SOLN
1.0000 [drp] | Freq: Four times a day (QID) | OPHTHALMIC | Status: DC | PRN
  Filled 2017-03-05: qty 15

## 2017-03-05 MED ORDER — PHENYLEPHRINE HCL 10 MG/ML IJ SOLN
INTRAVENOUS | Status: DC | PRN
  Administered 2017-03-05: 100 ug/min via INTRAVENOUS

## 2017-03-05 MED ORDER — SUCCINYLCHOLINE CHLORIDE 200 MG/10ML IV SOSY
PREFILLED_SYRINGE | INTRAVENOUS | Status: DC | PRN
  Administered 2017-03-05: 140 mg via INTRAVENOUS

## 2017-03-05 MED ORDER — SODIUM BICARBONATE 8.4 % IV SOLN
50.0000 meq | Freq: Once | INTRAVENOUS | Status: DC
  Filled 2017-03-05: qty 50

## 2017-03-05 MED ORDER — SODIUM CHLORIDE 0.9% FLUSH
3.0000 mL | Freq: Two times a day (BID) | INTRAVENOUS | Status: DC
  Administered 2017-03-05 – 2017-03-06 (×3): 3 mL via INTRAVENOUS

## 2017-03-05 MED ORDER — METHOCARBAMOL 1000 MG/10ML IJ SOLN: 1000.0000 mg | Freq: Four times a day (QID) | INTRAVENOUS | Status: DC | PRN

## 2017-03-05 MED ORDER — GABAPENTIN 300 MG PO CAPS
300.0000 mg | ORAL_CAPSULE | ORAL | Status: AC
  Administered 2017-03-05: 300 mg via ORAL
  Filled 2017-03-05: qty 1

## 2017-03-05 MED ORDER — ROCURONIUM BROMIDE 50 MG/5ML IV SOSY
PREFILLED_SYRINGE | INTRAVENOUS | Status: AC
  Filled 2017-03-05: qty 5

## 2017-03-05 MED ORDER — HYDRALAZINE HCL 20 MG/ML IJ SOLN: 5.0000 mg | Freq: Four times a day (QID) | INTRAMUSCULAR | Status: DC | PRN

## 2017-03-05 MED ORDER — SUGAMMADEX SODIUM 500 MG/5ML IV SOLN
INTRAVENOUS | Status: AC
  Filled 2017-03-05: qty 5

## 2017-03-05 MED ORDER — PHENYLEPHRINE HCL 10 MG/ML IJ SOLN
INTRAVENOUS | Status: DC | PRN
  Administered 2017-03-05: 140 ug/min via INTRAVENOUS

## 2017-03-05 MED ORDER — PROPOFOL 10 MG/ML IV BOLUS
INTRAVENOUS | Status: AC
  Filled 2017-03-05: qty 20

## 2017-03-05 MED ORDER — SODIUM CHLORIDE 0.9% FLUSH: 3.0000 mL | INTRAVENOUS | Status: DC | PRN

## 2017-03-05 MED ORDER — GABAPENTIN 300 MG PO CAPS
300.0000 mg | ORAL_CAPSULE | Freq: Every day | ORAL | Status: DC
Start: 2017-03-05 — End: 2017-03-07
  Administered 2017-03-05 – 2017-03-06 (×2): 300 mg via ORAL
  Filled 2017-03-05 (×2): qty 1

## 2017-03-05 MED ORDER — PSYLLIUM 95 % PO PACK
1.0000 | PACK | Freq: Every day | ORAL | Status: DC
  Administered 2017-03-06: 1 via ORAL
  Filled 2017-03-05 (×2): qty 1

## 2017-03-05 MED ORDER — PROCHLORPERAZINE EDISYLATE 5 MG/ML IJ SOLN: 5.0000 mg | INTRAMUSCULAR | Status: DC | PRN

## 2017-03-05 MED ORDER — PHENYLEPHRINE 40 MCG/ML (10ML) SYRINGE FOR IV PUSH (FOR BLOOD PRESSURE SUPPORT)
PREFILLED_SYRINGE | INTRAVENOUS | Status: DC | PRN
  Administered 2017-03-05: 40 ug via INTRAVENOUS
  Administered 2017-03-05 (×2): 80 ug via INTRAVENOUS

## 2017-03-05 MED ORDER — PHENOL 1.4 % MT LIQD: 1.0000 | OROMUCOSAL | Status: DC | PRN

## 2017-03-05 MED ORDER — PHENYLEPHRINE HCL 10 MG/ML IJ SOLN: 30.0000 ug/min | INTRAMUSCULAR | Status: DC

## 2017-03-05 MED ORDER — DIPHENHYDRAMINE HCL 25 MG PO CAPS: 25.0000 mg | ORAL_CAPSULE | Freq: Four times a day (QID) | ORAL | Status: DC | PRN

## 2017-03-05 MED ORDER — SUGAMMADEX SODIUM 200 MG/2ML IV SOLN
INTRAVENOUS | Status: DC | PRN
  Administered 2017-03-05: 275 mg via INTRAVENOUS

## 2017-03-05 MED ORDER — SODIUM CHLORIDE 0.9 % IV SOLN: 250.0000 mL | INTRAVENOUS | Status: DC | PRN

## 2017-03-05 MED ORDER — LACTATED RINGERS IV BOLUS (SEPSIS): 1000.0000 mL | Freq: Three times a day (TID) | INTRAVENOUS | Status: DC | PRN

## 2017-03-05 MED ORDER — PROMETHAZINE HCL 25 MG/ML IJ SOLN: 6.2500 mg | INTRAMUSCULAR | Status: DC | PRN

## 2017-03-05 MED ORDER — FENTANYL CITRATE (PF) 100 MCG/2ML IJ SOLN
INTRAMUSCULAR | Status: DC | PRN
  Administered 2017-03-05 (×5): 50 ug via INTRAVENOUS

## 2017-03-05 MED ORDER — INSULIN GLARGINE 100 UNIT/ML ~~LOC~~ SOLN
15.0000 [IU] | Freq: Two times a day (BID) | SUBCUTANEOUS | Status: DC
  Administered 2017-03-05 – 2017-03-06 (×2): 15 [IU] via SUBCUTANEOUS
  Filled 2017-03-05 (×4): qty 0.15

## 2017-03-05 MED ORDER — METHOCARBAMOL 500 MG PO TABS
1000.0000 mg | ORAL_TABLET | Freq: Four times a day (QID) | ORAL | Status: DC | PRN
  Administered 2017-03-06: 1000 mg via ORAL
  Filled 2017-03-05: qty 2

## 2017-03-05 MED ORDER — FENTANYL CITRATE (PF) 250 MCG/5ML IJ SOLN
INTRAMUSCULAR | Status: AC
  Filled 2017-03-05: qty 5

## 2017-03-05 MED ORDER — ALUM & MAG HYDROXIDE-SIMETH 200-200-20 MG/5ML PO SUSP: 30.0000 mL | Freq: Four times a day (QID) | ORAL | Status: DC | PRN

## 2017-03-05 MED ORDER — POLYETHYLENE GLYCOL 3350 17 G PO PACK: 17.0000 g | PACK | Freq: Two times a day (BID) | ORAL | Status: DC | PRN

## 2017-03-05 MED ORDER — MENTHOL 3 MG MT LOZG
1.0000 | LOZENGE | OROMUCOSAL | Status: DC | PRN
  Filled 2017-03-05: qty 9

## 2017-03-05 MED ORDER — CHLORHEXIDINE GLUCONATE CLOTH 2 % EX PADS
6.0000 | MEDICATED_PAD | Freq: Every day | CUTANEOUS | Status: DC
  Administered 2017-03-07: 6 via TOPICAL

## 2017-03-05 MED ORDER — BISACODYL 10 MG RE SUPP: 10.0000 mg | Freq: Two times a day (BID) | RECTAL | Status: DC | PRN

## 2017-03-05 MED ORDER — ONDANSETRON HCL 4 MG/2ML IJ SOLN
INTRAMUSCULAR | Status: AC
  Filled 2017-03-05: qty 2

## 2017-03-05 MED ORDER — LACTATED RINGERS IR SOLN
Status: DC | PRN
  Administered 2017-03-05: 1000 mL

## 2017-03-05 MED ORDER — TRAMADOL HCL 50 MG PO TABS: 50.0000 mg | ORAL_TABLET | Freq: Four times a day (QID) | ORAL | 0 refills | Status: DC | PRN

## 2017-03-05 SURGICAL SUPPLY — 41 items
APPLIER CLIP ROT 10 11.4 M/L (STAPLE) ×2
CABLE HIGH FREQUENCY MONO STRZ (ELECTRODE) ×2 IMPLANT
CHLORAPREP W/TINT 26ML (MISCELLANEOUS) ×4 IMPLANT
CLIP APPLIE ROT 10 11.4 M/L (STAPLE) ×1 IMPLANT
COVER MAYO STAND STRL (DRAPES) ×2 IMPLANT
COVER SURGICAL LIGHT HANDLE (MISCELLANEOUS) ×2 IMPLANT
DECANTER SPIKE VIAL GLASS SM (MISCELLANEOUS) ×2 IMPLANT
DERMABOND ADVANCED (GAUZE/BANDAGES/DRESSINGS)
DERMABOND ADVANCED .7 DNX12 (GAUZE/BANDAGES/DRESSINGS) IMPLANT
DRAPE C-ARM 42X120 X-RAY (DRAPES) ×2 IMPLANT
DRAPE WARM FLUID 44X44 (DRAPE) ×2 IMPLANT
DRSG TEGADERM 2-3/8X2-3/4 SM (GAUZE/BANDAGES/DRESSINGS) ×2 IMPLANT
DRSG TEGADERM 4X4.75 (GAUZE/BANDAGES/DRESSINGS) ×2 IMPLANT
ELECT REM PT RETURN 15FT ADLT (MISCELLANEOUS) ×2 IMPLANT
ENDOLOOP SUT PDS II  0 18 (SUTURE) ×2
ENDOLOOP SUT PDS II 0 18 (SUTURE) ×2 IMPLANT
GAUZE SPONGE 2X2 8PLY STRL LF (GAUZE/BANDAGES/DRESSINGS) ×1 IMPLANT
GOWN STRL REUS W/TWL XL LVL3 (GOWN DISPOSABLE) ×8 IMPLANT
IRRIG SUCT STRYKERFLOW 2 WTIP (MISCELLANEOUS) ×2
IRRIGATION SUCT STRKRFLW 2 WTP (MISCELLANEOUS) ×1 IMPLANT
KIT BASIN OR (CUSTOM PROCEDURE TRAY) ×2 IMPLANT
POUCH RETRIEVAL ECOSAC 10 (ENDOMECHANICALS) ×1 IMPLANT
POUCH RETRIEVAL ECOSAC 10MM (ENDOMECHANICALS) ×1
SCISSORS LAP 5X35 DISP (ENDOMECHANICALS) ×2 IMPLANT
SET CHOLANGIOGRAPH MIX (MISCELLANEOUS) ×2 IMPLANT
SLEEVE XCEL OPT CAN 5 100 (ENDOMECHANICALS) ×4 IMPLANT
SPONGE GAUZE 2X2 STER 10/PKG (GAUZE/BANDAGES/DRESSINGS) ×1
STOPCOCK 4 WAY LG BORE MALE ST (IV SETS) ×2 IMPLANT
SUT MNCRL AB 4-0 PS2 18 (SUTURE) ×2 IMPLANT
SUT PDS AB 1 CT1 27 (SUTURE) ×4 IMPLANT
SYR 10ML ECCENTRIC (SYRINGE) ×2 IMPLANT
SYR 20CC LL (SYRINGE) ×8 IMPLANT
TOWEL OR 17X26 10 PK STRL BLUE (TOWEL DISPOSABLE) ×2 IMPLANT
TOWEL OR NON WOVEN STRL DISP B (DISPOSABLE) ×2 IMPLANT
TRAY LAPAROSCOPIC (CUSTOM PROCEDURE TRAY) ×2 IMPLANT
TROCAR ADV FIXATION 11X100MM (TROCAR) ×2 IMPLANT
TROCAR ADV FIXATION 5X100MM (TROCAR) ×2 IMPLANT
TROCAR BLADELESS OPT 5 100 (ENDOMECHANICALS) ×2 IMPLANT
TROCAR XCEL BLUNT TIP 100MML (ENDOMECHANICALS) ×2 IMPLANT
TROCAR XCEL NON-BLD 11X100MML (ENDOMECHANICALS) ×2 IMPLANT
TUBING INSUF HEATED (TUBING) ×2 IMPLANT

## 2017-03-05 NOTE — Anesthesia Procedure Notes (Signed)
Arterial Line Insertion Start/End10/21/2018 11:46 AM, 03/05/2017 11:48 AM Performed by: Josephine Igo, anesthesiologist  Patient location: OR. Preanesthetic checklist: patient identified, IV checked, site marked, risks and benefits discussed, surgical consent, monitors and equipment checked, pre-op evaluation, timeout performed and anesthesia consent Lidocaine 1% used for infiltration Right, radial was placed Catheter size: 20 G Hand hygiene performed  and maximum sterile barriers used  Allen's test indicative of satisfactory collateral circulation Attempts: 1 Procedure performed without using ultrasound guided technique. Ultrasound Notes:anatomy identified Following insertion, Biopatch and dressing applied. Post procedure assessment: normal  Patient tolerated the procedure well with no immediate complications.

## 2017-03-05 NOTE — Progress Notes (Signed)
Inpatient Diabetes Program Recommendations  AACE/ADA: New Consensus Statement on Inpatient Glycemic Control (2015)  Target Ranges:  Prepandial:   less than 140 mg/dL      Peak postprandial:   less than 180 mg/dL (1-2 hours)      Critically ill patients:  140 - 180 mg/dL   Results for David [name], [name] (MRN [id number]) as of 03/05/2017 10:52  Ref. Range 03/04/2017 17:05 03/04/2017 21:27 03/04/2017 23:34 03/05/2017 04:24 03/05/2017 07:49  Glucose-Capillary Latest Ref Range: 65 - 99 mg/dL 97 87 99 147 (H)  Novolog 3 units 141 (H)  Novolog 3 units   Review of Glycemic Control  Diabetes history: DM2 Outpatient Diabetes medications: Medtronic insulin pump with Humulin R U500 insulin, Victoza 1.8 mg daily Current orders for Inpatient glycemic control: Lantus 45 units BID, Novolog 0-20 units Q4H  Inpatient Diabetes Program Recommendations: Insulin - Basal: In reviewing the chart, noted patient refused Lantus last night and this morning.  Fasting glcose 147 mg/dl this morning. May want to consider discontinuing Lantus as ordered and if glucose is consistently elevated > 180 mg/dl then reordering Lantus at a lower dose (Lantus 13 units BID which is based on 131 kg x 0.2 units).  NOTE: Noted consult for Diabetes Coordinator (insulin pump). Diabetes Coordinator is not on campus over the weekend but is available by pager daily 8am-5pm for questions.  In reviewing chart, noted patient is followed by Dr. Dwyane Dee and last seen him on 02/22/17. Per Dr. Ronnie Derby office note on 02/22/17 patient's insulin pump settings should be:   BASAL rates:Midnight = 0.8 ,6 AM = 1.1, 3 PM = 0.9 and 9 PM = 0.7 I/C ratio: 25 (1 unit covers 25 grams of carbohydrates) ISF: 45 (1 unit drops glucose 45 mg/dl) Target Glucose 120-150 mg/dl  Per H&P, patient was asked to remove insulin pump when admitted and was ordered SQ insulin. Tried to call patient's room but no answer. Lynnae Sandhoff, RN (bedside RN caring for patient on unit  today) and she confirms that patient does not have on his insulin pump as it was removed yesterday. Kim, RN reports patient was just taken down to surgery a few minutes ago. Patient has refused Lantus last night and this morning with glucose trending 87-147 mg/dl since being admitted.   Thanks, Barnie Alderman, RN, MSN, CDE Diabetes Coordinator Inpatient Diabetes Program [phone] (Team Pager from 8am to 5pm)

## 2017-03-05 NOTE — Progress Notes (Signed)
[address]  [name] [place], [place] [address] Phone: [phone]  FAX: [place] [phone] 05-23-1940  CARE TEAM:  PCP: [name], MD  Outpatient Care Team: Patient Care Team: [name], MD as PCP - General (Family Medicine) [name], MD as Consulting Physician (Podiatry) [name], MD as Consulting Physician (Nephrology)  Inpatient Treatment Team: Treatment Team: Attending Provider: [name], MD; Consulting Physician: [name], Md, MD; Rounding Team: Fatima Blank, MD; Technician: Janie Morning, NT; Registered Nurse: Nolene Ebbs, RN; Rounding Team: [facility], Rounding, MD   Problem List:   Principal Problem:   Acute cholecystitis Active Problems:   HOCM (hypertrophic obstructive cardiomyopathy) (HCC)   IDDM (insulin dependent diabetes mellitus) ([place])   CKD stage 3 due to type 2 diabetes mellitus ([place])   Hypertension associated with diabetes ([place])   Hyperlipidemia associated with type 2 diabetes mellitus ([place])   Obesity   OSA on CPAP   Pacemaker     Assessment  Cholecystitis  Plan:  -Laparoscopic cholecystectomy today:  The anatomy & physiology of hepatobiliary & pancreatic function was discussed.  The pathophysiology of gallbladder dysfunction was discussed.  Natural history risks without surgery was discussed.   I feel the risks of no intervention will lead to serious problems that outweigh the operative risks; therefore, I recommended cholecystectomy to remove the pathology.  I explained laparoscopic techniques with possible need for an open approach.  Probable cholangiogram to evaluate the bilary tract was explained as well.    Risks such as bleeding, infection, abscess, leak, injury to other organs, need for repair of tissues / organs, need for further treatment, stroke, heart attack, death, and other risks were discussed.  I noted a good  likelihood this will help address the problem.  Possibility that this will not correct all abdominal symptoms was explained.  Goals of post-operative recovery were discussed as well.  We will work to minimize complications.  An educational handout further explaining the pathology and treatment options was given as well.  Questions were answered.  I updated the patient's status to the patient and family.  Recommendations were made.  Questions were answered.  They expressed understanding & appreciation.  Appreciated cardiac & medicine input.  We will have a low threshold to watch in the stepdown unit if he misbehaves  in the operating room or PACU.  Hopefully can do this laparoscopically with close control of his diabetes and other issues.  -VTE prophylaxis- SCDs, etc -mobilize as tolerated to help recovery  25 minutes spent in review, evaluation, examination, counseling, and coordination of care.  More than 50% of that time was spent in counseling.  David [name], [name], F.A.C.S. Gastrointestinal and Minimally Invasive Surgery [facility] [address], [place], [place] [address] [phone] Main / Paging   03/05/2017    Subjective: (Chief complaint)  Pain less.  Seen by cardiology  Objective:  Vital signs:  Vitals:   03/04/17 1825 03/04/17 2134 03/05/17 0607 03/05/17 0942  BP: 119/85 (!) 158/65 (!) 164/66 (!) 150/43  Pulse: 64 60 60 61  Resp: '16 16 16 15  ' Temp: 98.5 F (36.9 C) 98.5 F (36.9 C) 98.8 F (37.1 C) 98 F (36.7 C)  TempSrc: Oral Oral Oral Oral  SpO2: 98% 99% 99% 98%  Weight:      Height:        Last BM Date: 03/03/17  Intake/Output  Yesterday:  10/20 0701 - 10/21 0700 In: 7989 [P.O.:240; IV Piggyback:1050] Out: -  This shift:  Total I/O In: -  Out: 250 [Urine:250]  Bowel function:  Flatus: YES  BM:  No  Drain: (No drain)   Physical Exam:  General: Pt awake/alert/oriented x4 in no acute  distress Eyes: PERRL, normal EOM.  Sclera clear.  No icterus Neuro: CN II-XII intact w/o focal sensory/motor deficits. Lymph: No head/neck/groin lymphadenopathy Psych:  No delerium/psychosis/paranoia HENT: Normocephalic, Mucus membranes moist.  No thrush Neck: Supple, No tracheal deviation Chest: No chest wall pain w good excursion CV:  Pulses intact.  Regular rhythm MS: Normal AROM mjr joints.  No obvious deformity  Abdomen: Obese Somewhat firm.  Mildy distended.  Tenderness at RUQ.  No evidence of peritonitis.  No incarcerated hernias.  Ext:  No deformity.  1-2 BLE edema.  No cyanosis Skin: No petechiae / purpura  Results:   Labs: Results for orders placed or performed during the hospital encounter of 03/04/17 (from the past 48 hour(s))  CBC with Differential     Status: Abnormal   Collection Time: 03/04/17  9:22 AM  Result Value Ref Range   WBC 8.7 4.0 - 10.5 K/uL   RBC 3.71 (L) 4.22 - 5.81 MIL/uL   Hemoglobin 11.6 (L) 13.0 - 17.0 g/dL   HCT 34.8 (L) 39.0 - 52.0 %   MCV 93.8 78.0 - 100.0 fL   MCH 31.3 26.0 - 34.0 pg   MCHC 33.3 30.0 - 36.0 g/dL   RDW 14.0 11.5 - 15.5 %   Platelets 169 150 - 400 K/uL   Neutrophils Relative % 80 %   Neutro Abs 7.0 1.7 - 7.7 K/uL   Lymphocytes Relative 12 %   Lymphs Abs 1.0 0.7 - 4.0 K/uL   Monocytes Relative 7 %   Monocytes Absolute 0.6 0.1 - 1.0 K/uL   Eosinophils Relative 1 %   Eosinophils Absolute 0.1 0.0 - 0.7 K/uL   Basophils Relative 0 %   Basophils Absolute 0.0 0.0 - 0.1 K/uL  Comprehensive metabolic panel     Status: Abnormal   Collection Time: 03/04/17  9:22 AM  Result Value Ref Range   Sodium 136 135 - 145 mmol/L   Potassium 5.7 (H) 3.5 - 5.1 mmol/L   Chloride 105 101 - 111 mmol/L   CO2 25 22 - 32 mmol/L   Glucose, Bld 262 (H) 65 - 99 mg/dL   BUN 47 (H) 6 - 20 mg/dL   Creatinine, Ser 2.19 (H) 0.61 - 1.24 mg/dL   Calcium 9.9 8.9 - 10.3 mg/dL   Total Protein 7.3 6.5 - 8.1 g/dL   Albumin 3.9 3.5 - 5.0 g/dL   AST 21 15 -  41 U/L   ALT 28 17 - 63 U/L   Alkaline Phosphatase 96 38 - 126 U/L   Total Bilirubin 0.5 0.3 - 1.2 mg/dL   GFR calc non Af Amer 28 (L) >60 mL/min   GFR calc Af Amer 32 (L) >60 mL/min    Comment: (NOTE) The eGFR has been calculated using the CKD EPI equation. This calculation has not been validated in all clinical situations. eGFR's persistently <60 mL/min signify possible Chronic Kidney Disease.    Anion gap 6 5 - 15  Lipase, blood     Status: None   Collection Time: 03/04/17  9:22 AM  Result Value Ref Range   Lipase 29 11 - 51 U/L  Occult blood card to lab, stool Provider will collect  Status: None   Collection Time: 03/04/17 10:00 AM  Result Value Ref Range   Fecal Occult Bld NEGATIVE NEGATIVE  Urinalysis, Routine w reflex microscopic     Status: Abnormal   Collection Time: 03/04/17 10:47 AM  Result Value Ref Range   Color, Urine STRAW (A) YELLOW   APPearance CLEAR CLEAR   Specific Gravity, Urine 1.010 1.005 - 1.030   pH 6.0 5.0 - 8.0   Glucose, UA >=500 (A) NEGATIVE mg/dL   Hgb urine dipstick SMALL (A) NEGATIVE   Bilirubin Urine NEGATIVE NEGATIVE   Ketones, ur NEGATIVE NEGATIVE mg/dL   Protein, ur 30 (A) NEGATIVE mg/dL   Nitrite NEGATIVE NEGATIVE   Leukocytes, UA NEGATIVE NEGATIVE  Urinalysis, Microscopic (reflex)     Status: Abnormal   Collection Time: 03/04/17 10:47 AM  Result Value Ref Range   RBC / HPF 0-5 0 - 5 RBC/hpf   WBC, UA 0-5 0 - 5 WBC/hpf   Bacteria, UA RARE (A) NONE SEEN   Squamous Epithelial / LPF NONE SEEN NONE SEEN  CBG monitoring, ED     Status: None   Collection Time: 03/04/17  5:05 PM  Result Value Ref Range   Glucose-Capillary 97 65 - 99 mg/dL  Basic metabolic panel     Status: Abnormal   Collection Time: 03/04/17  7:18 PM  Result Value Ref Range   Sodium 140 135 - 145 mmol/L   Potassium 4.7 3.5 - 5.1 mmol/L   Chloride 107 101 - 111 mmol/L   CO2 25 22 - 32 mmol/L   Glucose, Bld 80 65 - 99 mg/dL   BUN 40 (H) 6 - 20 mg/dL   Creatinine,  Ser 2.19 (H) 0.61 - 1.24 mg/dL   Calcium 9.6 8.9 - 10.3 mg/dL   GFR calc non Af Amer 28 (L) >60 mL/min   GFR calc Af Amer 32 (L) >60 mL/min    Comment: (NOTE) The eGFR has been calculated using the CKD EPI equation. This calculation has not been validated in all clinical situations. eGFR's persistently <60 mL/min signify possible Chronic Kidney Disease.    Anion gap 8 5 - 15  Glucose, capillary     Status: None   Collection Time: 03/04/17  9:27 PM  Result Value Ref Range   Glucose-Capillary 87 65 - 99 mg/dL  Glucose, capillary     Status: None   Collection Time: 03/04/17 11:34 PM  Result Value Ref Range   Glucose-Capillary 99 65 - 99 mg/dL  Glucose, capillary     Status: Abnormal   Collection Time: 03/05/17  4:24 AM  Result Value Ref Range   Glucose-Capillary 147 (H) 65 - 99 mg/dL  Comprehensive metabolic panel     Status: Abnormal   Collection Time: 03/05/17  5:03 AM  Result Value Ref Range   Sodium 139 135 - 145 mmol/L   Potassium 5.9 (H) 3.5 - 5.1 mmol/L    Comment: DELTA CHECK NOTED SLIGHT HEMOLYSIS    Chloride 109 101 - 111 mmol/L   CO2 25 22 - 32 mmol/L   Glucose, Bld 161 (H) 65 - 99 mg/dL   BUN 40 (H) 6 - 20 mg/dL   Creatinine, Ser 2.24 (H) 0.61 - 1.24 mg/dL   Calcium 8.9 8.9 - 10.3 mg/dL   Total Protein 6.1 (L) 6.5 - 8.1 g/dL   Albumin 3.2 (L) 3.5 - 5.0 g/dL   AST 16 15 - 41 U/L   ALT 22 17 - 63 U/L   Alkaline Phosphatase 82  38 - 126 U/L   Total Bilirubin 0.5 0.3 - 1.2 mg/dL   GFR calc non Af Amer 27 (L) >60 mL/min   GFR calc Af Amer 31 (L) >60 mL/min    Comment: (NOTE) The eGFR has been calculated using the CKD EPI equation. This calculation has not been validated in all clinical situations. eGFR's persistently <60 mL/min signify possible Chronic Kidney Disease.    Anion gap 5 5 - 15  CBC     Status: Abnormal   Collection Time: 03/05/17  5:03 AM  Result Value Ref Range   WBC 8.3 4.0 - 10.5 K/uL   RBC 3.24 (L) 4.22 - 5.81 MIL/uL   Hemoglobin 10.0 (L)  13.0 - 17.0 g/dL   HCT 30.4 (L) 39.0 - 52.0 %   MCV 93.8 78.0 - 100.0 fL   MCH 30.9 26.0 - 34.0 pg   MCHC 32.9 30.0 - 36.0 g/dL   RDW 14.3 11.5 - 15.5 %   Platelets 159 150 - 400 K/uL  Glucose, capillary     Status: Abnormal   Collection Time: 03/05/17  7:49 AM  Result Value Ref Range   Glucose-Capillary 141 (H) 65 - 99 mg/dL  Potassium     Status: Abnormal   Collection Time: 03/05/17  9:25 AM  Result Value Ref Range   Potassium 5.8 (H) 3.5 - 5.1 mmol/L    Imaging / Studies: Ct Abdomen Pelvis Wo Contrast  Result Date: 03/04/2017 CLINICAL DATA:  Upper abdominal pain with nausea for several days EXAM: CT ABDOMEN AND PELVIS WITHOUT CONTRAST TECHNIQUE: Multidetector CT imaging of the abdomen and pelvis was performed following the standard protocol without IV contrast. COMPARISON:  07/23/2015 FINDINGS: Lower chest: No acute abnormality. Hepatobiliary: The liver is within normal limits. The gallbladder is well distended with some dependent density consistent with small stones or gallbladder sludge. Some suggestion of gallbladder wall thickening is noted although incompletely evaluated on this exam. Ultrasound may be helpful for further evaluation. Pancreas: Stable calcifications in the pancreatic head are noted. No pancreatic mass is seen. No ductal dilatation is noted. Spleen: Normal in size without focal abnormality. Adrenals/Urinary Tract: The adrenal glands are within normal limits. Cystic changes are noted bilaterally. No renal calculi or obstructive changes are seen. The bladder is well distended. Stomach/Bowel: Scattered diverticular change of the colon is noted without evidence of diverticulitis. Appendix is within normal limits without inflammatory changes. Vascular/Lymphatic: Aortic atherosclerosis. No enlarged abdominal or pelvic lymph nodes. Reproductive: Prostate is unremarkable. Other: No abdominal wall hernia or abnormality. No abdominopelvic ascites. Musculoskeletal: Degenerative  changes of lumbar spine are noted. No acute abnormality is seen. IMPRESSION: Well distended gallbladder with some suggestion of gallbladder wall thickening. Layering density likely related to small stones or sludge is noted as well. Ultrasound may be helpful for further evaluation. Chronic changes without acute abnormality. Electronically Signed   By: Inez Catalina [name]   On: 03/04/2017 11:29   US Abdomen Limited Ruq  Result Date: 03/04/2017 CLINICAL DATA:  Acute abdominal pain and nausea for 2 days. Possible gallbladder wall thickening on CT performed today. EXAM: ULTRASOUND ABDOMEN LIMITED RIGHT UPPER QUADRANT COMPARISON:  03/04/2017 CT FINDINGS: Gallbladder: Gallbladder wall thickening is identified proximally measuring up to 5 mm. Cholelithiasis noted with the largest calculus measuring 9 mm. No pericholecystic fluid identified. No definite sonographic Murphy sign but the patient is on pain medication. Common bile duct: Diameter: 6 mm.  No intrahepatic or extrahepatic biliary dilatation. Liver: No focal lesion identified. Within normal limits  in parenchymal echogenicity. Portal vein is patent on color Doppler imaging with normal direction of blood flow towards the liver. IMPRESSION: Proximal gallbladder wall thickening with cholelithiasis, suspicious for acute cholecystitis. No biliary dilatation. Electronically Signed   By: Margarette Canada [name]   On: 03/04/2017 12:41    Medications / Allergies: per chart  Antibiotics: Anti-infectives    Start     Dose/Rate Route Frequency Ordered Stop   03/04/17 2200  piperacillin-tazobactam (ZOSYN) IVPB 3.375 g     3.375 g 12.5 mL/hr over 240 Minutes Intravenous Every 8 hours 03/04/17 1824     03/04/17 1300  piperacillin-tazobactam (ZOSYN) IVPB 3.375 g     3.375 g 100 mL/hr over 30 Minutes Intravenous  Once 03/04/17 1259 03/04/17 1346        Note: Portions of this report may have been transcribed using voice recognition software. Every effort was made to  ensure accuracy; however, inadvertent computerized transcription errors may be present.   Any transcriptional errors that result from this process are unintentional.     David [name], [name], F.A.C.S. Gastrointestinal and Minimally Invasive Surgery [facility] [address], [place], [place] [address] [phone] Main / Paging   03/05/2017

## 2017-03-05 NOTE — Anesthesia Postprocedure Evaluation (Signed)
Anesthesia Post Note  Patient: David [name]  Procedure(s) Performed: LAPAROSCOPIC CHOLECYSTECTOMY WITH INTRAOPERATIVE CHOLANGIOGRAM (N/A Abdomen)     Patient location during evaluation: PACU Anesthesia Type: General Level of consciousness: awake and alert and oriented Pain management: pain level controlled Vital Signs Assessment: post-procedure vital signs reviewed and stable Respiratory status: spontaneous breathing, nonlabored ventilation, respiratory function stable and patient connected to nasal cannula oxygen Cardiovascular status: blood pressure returned to baseline and stable Postop Assessment: no apparent nausea or vomiting Anesthetic complications: no    Last Vitals:  Vitals:   03/05/17 1339 03/05/17 1400  BP: (!) 169/56 (!) 152/56  Pulse: 60   Resp: 12   Temp: 36.7 C   SpO2: 100%     Last Pain:  Vitals:   03/05/17 0942  TempSrc: Oral  PainSc:                  [name] A.

## 2017-03-05 NOTE — Anesthesia Procedure Notes (Signed)
Procedure Name: Intubation Date/Time: 03/05/2017 11:50 AM Performed by: Anne Fu Pre-anesthesia Checklist: Patient identified, Emergency Drugs available, Suction available, Patient being monitored and Timeout performed Patient Re-evaluated:Patient Re-evaluated prior to induction Oxygen Delivery Method: Circle system utilized Preoxygenation: Pre-oxygenation with 100% oxygen Induction Type: IV induction, Cricoid Pressure applied and Rapid sequence Laryngoscope Size: Mac and 4 Grade View: Grade I Tube type: Oral Tube size: 7.5 mm Number of attempts: 1 Airway Equipment and Method: Video-laryngoscopy and Rigid stylet Placement Confirmation: ETT inserted through vocal cords under direct vision,  positive ETCO2 and breath sounds checked- equal and bilateral Secured at: 23 cm Tube secured with: Tape Dental Injury: Teeth and Oropharynx as per pre-operative assessment

## 2017-03-05 NOTE — Progress Notes (Signed)
[facility] PROGRESS NOTE  David [name] ERD:[id number] DOB: [birth year] DOA: 03/04/2017  PCP: [name], MD  Brief History/Interval Summary: 76 year old Caucasian male with a past medical history of hypertrophic cardiomyopathy, chronic kidney disease stage III, history of insulin-dependent diabetes mellitus on insulin pump, who was in his usual state of health until 1 day prior to admission when he developed abdominal discomfort which got worse through the night.  Presented to the emergency department and was found to have cholelithiasis with suspicion for cholecystitis.  Patient was hospitalized for further management.  Reason for Visit: Cholelithiasis   Consultants: General surgery.  Cardiology.  Procedures: Laparoscopic cholecystectomy is planned for today  Antibiotics: Zosyn  Subjective/Interval History: Patient feels well this morning.  He is waiting on surgery.  Denies any nausea vomiting.  Had an uneventful night.  ROS: Denies any chest pain or shortness of breath  Objective:  Vital Signs  Vitals:   03/04/17 1825 03/04/17 2134 03/05/17 0607 03/05/17 0942  BP: 119/85 (!) 158/65 (!) 164/66 (!) 150/43  Pulse: 64 60 60 61  Resp: 16 16 16 15   Temp: 98.5 F (36.9 C) 98.5 F (36.9 C) 98.8 F (37.1 C) 98 F (36.7 C)  TempSrc: Oral Oral Oral Oral  SpO2: 98% 99% 99% 98%  Weight:      Height:        Intake/Output Summary (Last 24 hours) at 03/05/17 1155 Last data filed at 03/05/17 0815  Gross per 24 hour  Intake              290 ml  Output              250 ml  Net               40 ml   Filed Weights   03/04/17 0904  Weight: 131.5 kg (290 lb)    General appearance: alert, cooperative, appears stated age and no distress Resp: clear to auscultation bilaterally Cardio: regular rate and rhythm, S1, S2 normal, no murmur, click, rub or gallop GI: soft, non-tender; bowel sounds normal; no masses,  no organomegaly Extremities: extremities normal,  atraumatic, no cyanosis or edema Neurologic: No focal deficits  Lab Results:  Data Reviewed: I have personally reviewed following labs and imaging studies  CBC:  Recent Labs Lab 03/04/17 0922 03/05/17 0503  WBC 8.7 8.3  NEUTROABS 7.0  --   HGB 11.6* 10.0*  HCT 34.8* 30.4*  MCV 93.8 93.8  PLT 169 563    Basic Metabolic Panel:  Recent Labs Lab 03/04/17 0922 03/04/17 1918 03/05/17 0503 03/05/17 0925  NA 136 140 139  --   K 5.7* 4.7 5.9* 5.8*  CL 105 107 109  --   CO2 25 25 25   --   GLUCOSE 262* 80 161*  --   BUN 47* 40* 40*  --   CREATININE 2.19* 2.19* 2.24*  --   CALCIUM 9.9 9.6 8.9  --     GFR: Estimated Creatinine Clearance: 41.5 mL/min (A) (by C-G formula based on SCr of 2.24 mg/dL (H)).  Liver Function Tests:  Recent Labs Lab 03/04/17 0922 03/05/17 0503  AST 21 16  ALT 28 22  ALKPHOS 96 82  BILITOT 0.5 0.5  PROT 7.3 6.1*  ALBUMIN 3.9 3.2*     Recent Labs Lab 03/04/17 0922  LIPASE 29   CBG:  Recent Labs Lab 03/04/17 1705 03/04/17 2127 03/04/17 2334 03/05/17 0424 03/05/17 0749  GLUCAP 97 87 99 147* 141*  Radiology Studies: Ct Abdomen Pelvis Wo Contrast  Result Date: 03/04/2017 CLINICAL DATA:  Upper abdominal pain with nausea for several days EXAM: CT ABDOMEN AND PELVIS WITHOUT CONTRAST TECHNIQUE: Multidetector CT imaging of the abdomen and pelvis was performed following the standard protocol without IV contrast. COMPARISON:  07/23/2015 FINDINGS: Lower chest: No acute abnormality. Hepatobiliary: The liver is within normal limits. The gallbladder is well distended with some dependent density consistent with small stones or gallbladder sludge. Some suggestion of gallbladder wall thickening is noted although incompletely evaluated on this exam. Ultrasound may be helpful for further evaluation. Pancreas: Stable calcifications in the pancreatic head are noted. No pancreatic mass is seen. No ductal dilatation is noted. Spleen: Normal in size  without focal abnormality. Adrenals/Urinary Tract: The adrenal glands are within normal limits. Cystic changes are noted bilaterally. No renal calculi or obstructive changes are seen. The bladder is well distended. Stomach/Bowel: Scattered diverticular change of the colon is noted without evidence of diverticulitis. Appendix is within normal limits without inflammatory changes. Vascular/Lymphatic: Aortic atherosclerosis. No enlarged abdominal or pelvic lymph nodes. Reproductive: Prostate is unremarkable. Other: No abdominal wall hernia or abnormality. No abdominopelvic ascites. Musculoskeletal: Degenerative changes of lumbar spine are noted. No acute abnormality is seen. IMPRESSION: Well distended gallbladder with some suggestion of gallbladder wall thickening. Layering density likely related to small stones or sludge is noted as well. Ultrasound may be helpful for further evaluation. Chronic changes without acute abnormality. Electronically Signed   By: Inez Catalina [name]   On: 03/04/2017 11:29   US Abdomen Limited Ruq  Result Date: 03/04/2017 CLINICAL DATA:  Acute abdominal pain and nausea for 2 days. Possible gallbladder wall thickening on CT performed today. EXAM: ULTRASOUND ABDOMEN LIMITED RIGHT UPPER QUADRANT COMPARISON:  03/04/2017 CT FINDINGS: Gallbladder: Gallbladder wall thickening is identified proximally measuring up to 5 mm. Cholelithiasis noted with the largest calculus measuring 9 mm. No pericholecystic fluid identified. No definite sonographic Murphy sign but the patient is on pain medication. Common bile duct: Diameter: 6 mm.  No intrahepatic or extrahepatic biliary dilatation. Liver: No focal lesion identified. Within normal limits in parenchymal echogenicity. Portal vein is patent on color Doppler imaging with normal direction of blood flow towards the liver. IMPRESSION: Proximal gallbladder wall thickening with cholelithiasis, suspicious for acute cholecystitis. No biliary dilatation.  Electronically Signed   By: Margarette Canada [name]   On: 03/04/2017 12:41     Medications:  Scheduled: . aspirin EC  81 mg Oral Daily  . carvedilol  12.5 mg Oral BID WC  . Chlorhexidine Gluconate Cloth  6 each Topical Once  . Chlorhexidine Gluconate Cloth  6 each Topical Q0600  . doxazosin  4 mg Oral Daily  . insulin aspart  0-20 Units Subcutaneous Q4H  . insulin glargine  15 Units Subcutaneous BID  . isosorbide mononitrate  60 mg Oral Daily  . pantoprazole  40 mg Oral Daily  . sertraline  50 mg Oral Daily  . sodium bicarbonate  50 mEq Intravenous Once  . torsemide  20 mg Oral QPM   Continuous: . piperacillin-tazobactam (ZOSYN)  IV Stopped (03/05/17 0723)   PRN:0.9 % irrigation (POUR BTL), acetaminophen **OR** acetaminophen, lactated ringers, morphine injection, ondansetron **OR** ondansetron (ZOFRAN) IV, polyvinyl alcohol  Assessment/Plan:  Principal Problem:   Acute cholecystitis Active Problems:   Hypertension associated with diabetes ([place])   Hyperlipidemia associated with type 2 diabetes mellitus ([place])   Obesity   CKD stage 3 due to type 2 diabetes mellitus ([place])   OSA  on CPAP   Pacemaker   HOCM (hypertrophic obstructive cardiomyopathy) ([facility])   IDDM (insulin dependent diabetes mellitus) - on insulin pump    Cholelithiasis with concern for acute cholecystitis Patient remains asymptomatic.  LFTs are normal.  General surgery is following.  Plan is for cholecystectomy.  Seen by cardiology and he may proceed to surgery according to their input.  History of hypertrophic cardiomyopathy with history of sinus node dysfunction status post pacemaker Appears to be stable.  Seen by cardiology.  Okay to proceed to surgery.  History of nonobstructive CAD Last cardiac catheterization was in 2012 as per cardiology notes.  Does not have any history of PCI/angioplasty.  He is on aspirin beta blocker nitrates which will be continued.  History of chronic kidney disease stage  III/hyperkalemia Sample was noted to be hemolyzed.  Discussed with general surgeon.  We will order one ampule of sodium bicarbonate.  May have to be repeated perioperatively.  Renal function is at baseline.  Continue to monitor urine output.  History of insulin-dependent diabetes mellitus on insulin pump He is followed by endocrinology.  HbA1c was 8.2 on October 5.  Patient was taken off of his pump due to n.p.o. status.  He apparently refused his Lantus dose last night.  His blood glucose level was 147 this morning.  We will hold his Lantus and resume tonight once he is back on a diet.  And alternatively he may even be able to get back on his pump.  Monitor CBGs.  SSI for now.  History of obstructive sleep apnea Continue CPAP.  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  No evidence for overt bleeding. Continue to monitor.  Morbid obesity Body mass index is 35.3 kg/m.   DVT Prophylaxis: SCDs    Code Status: FullCode Family Communication: Discussed with the patient and his wife Disposition Plan: Surgery today.    LOS: 1 day   [facility] Pager [phone] 03/05/2017, 11:55 AM  If 7PM-7AM, please contact night-coverage at [email or web address], password [email or web address]

## 2017-03-05 NOTE — Anesthesia Preprocedure Evaluation (Addendum)
Anesthesia Evaluation  Patient identified by MRN, date of birth, ID band Patient awake    Reviewed: Allergy & Precautions, NPO status , Patient's Chart, lab work & pertinent test results, reviewed documented beta blocker date and time   Airway Mallampati: II  TM Distance: >3 FB Neck ROM: Full    Dental  (+) Edentulous Upper, Edentulous Lower   Pulmonary shortness of breath and with exertion, sleep apnea and Continuous Positive Airway Pressure Ventilation , former smoker,    Pulmonary exam normal breath sounds clear to auscultation       Cardiovascular hypertension, Pt. on medications and Pt. on home beta blockers + CAD, + Peripheral Vascular Disease and +CHF  Normal cardiovascular exam+ pacemaker  Rhythm:Regular Rate:Normal  Hypertrophic Obstructive Cardiomyopathy Echo 2017: Left ventricle: The cavity size was normal. There was severe focal basal hypertrophy of the septum with mild posterior wall hypertrophy. Systolic function was normal. The estimated ejection fraction was in the range of 60% to 65%. Wall motion was normal; there were no regional wall motion abnormalities. - Aortic valve: There was trivial regurgitation.  Non obstructive CAD on last cath 2012 per hospitalist note   Neuro/Psych Anxiety Severe Diabetic peripheral neuropathy  Neuromuscular disease    GI/Hepatic Neg liver ROS, Cholelithiasis with cholecystitis   Endo/Other  diabetes, Poorly Controlled, Type 2, Insulin DependentObesity Hyperlipidemia Last Hb A1c - 8.2 Secondary hyperparathyroidism Insulin pump  Renal/GU Renal InsufficiencyRenal disease   BPH    Musculoskeletal  (+) Arthritis , Osteoarthritis,  S/P amputation right great toe. Non healing ulcer right foot   Abdominal (+) + obese,   Peds  Hematology  (+) anemia ,   Anesthesia Other Findings   Reproductive/Obstetrics                         Anesthesia  Physical Anesthesia Plan  ASA: III and emergent  Anesthesia Plan: General   Post-op Pain Management:    Induction: Intravenous, Cricoid pressure planned and Rapid sequence  PONV Risk Score and Plan: 4 or greater and Ondansetron, Dexamethasone, Propofol infusion, Promethazine and Treatment may vary due to age or medical condition  Airway Management Planned: Oral ETT  Additional Equipment: Arterial line  Intra-op Plan:   Post-operative Plan: Extubation in OR  Informed Consent: I have reviewed the patients History and Physical, chart, labs and discussed the procedure including the risks, benefits and alternatives for the proposed anesthesia with the patient or authorized representative who has indicated his/her understanding and acceptance.   Dental advisory given  Plan Discussed with: CRNA, Anesthesiologist and Surgeon  Anesthesia Plan Comments: (Phenylephrine drip for procedure. A-line for hemodynamic monitoring.)       Anesthesia Quick Evaluation

## 2017-03-05 NOTE — Transfer of Care (Signed)
Immediate Anesthesia Transfer of Care Note  Patient: David [name]  Procedure(s) Performed: Procedure(s): LAPAROSCOPIC CHOLECYSTECTOMY WITH INTRAOPERATIVE CHOLANGIOGRAM (N/A)  Patient Location: PACU  Anesthesia Type:General  Level of Consciousness:  sedated, patient cooperative and responds to stimulation  Airway & Oxygen Therapy:Patient Spontanous Breathing and Patient connected to face mask oxgen  Post-op Assessment:  Report given to PACU RN and Post -op Vital signs reviewed and stable  Post vital signs:  Reviewed and stable  Last Vitals:  Vitals:   03/05/17 0607 03/05/17 0942  BP: (!) 164/66 (!) 150/43  Pulse: 60 61  Resp: 16 15  Temp: 37.1 C 36.7 C  SpO2: 08% 81%    Complications: No apparent anesthesia complications

## 2017-03-05 NOTE — Consult Note (Signed)
Cardiology Consultation:   Patient ID: David [name]; [id number]; 11/11/1940   Admit date: 03/04/2017 Date of Consult: 03/05/2017  Primary Care Provider: [name], MD Primary Electrophysiologist:  Dr. Caryl Comes   Patient Profile:   David [name] is a 76 y.o. male with a hx of node dysfunction status post PPM, HCM, non-obstructive coronary artery disease, diabetes, hypertension OSA on CPAP, PAD s/p toe amputations, and CKD III, who is being seen today for the evaluation of pre-surgical risk assessment at the request of Dr. Alphonsa Overall.  History of Present Illness:   David [name] had been feeling well until 1 week ago when he developed diarrhea.  In the last 2 days he developed right upper quadrant pain and nausea.  He denies any fever or chills.  The symptoms became acutely worse and he had an episode of bilious emesis that prompted him to seek medical care.  He was seen in the emergency department where he was found to have cholelithiasis and concern for acute cholecystitis.  CT of the abdomen  demonstrated a distended gallbladder with some gallbladder wall thickening that was thought to be due to small stones and or sludge.  Gallbladder ultrasound revealed gallbladder wall thickening with cholelithiasis and suggestive of acute cholecystitis.  He was evaluated by Dr. Alphonsa Overall plans were made for an attempted laparoscopic cholecystectomy with plans to convert to open if necessary.  David [name] last saw Dr. Caryl Comes on 10/2016 and reported increasing dyspnea on exertion over the preceding 3-6 months.  He has been evaluated by his nephrologist and torsemide was increased due to lower extremity edema.  At that appointment he also complained of fatigue.  Since that time he reports that his lower extremity edema has been stable.  He continues to have dyspnea on exertion that is unchanged from baseline.  He is able to ambulate around [facility] without chest pain or shortness of breath.  He  denies orthopnea or PND.  David [name] had an echo 10/2015 that revealed LVEF 60-65% with severe septal hypertrophy and otherwise mild left ventricular hypertrophy.  He subsequently underwent testing at [facility] that was positive for hypertrophic cardiomyopathy.  He has no family history of sudden cardiac death.  He previously had a Lexiscan Myoview that was a false positive.  He had heart catheterizations in 2009 and 2012 that reportedly showed 60% RCA, 70% left circumflex, 60% OM 1 and 40% LAD disease.  Past Medical History:  Diagnosis Date  . Anemia, iron deficiency   . Anxiety   . Arthritis   . BPH (benign prostatic hypertrophy)   . CAD (coronary artery disease)    Nonobstructive CAD per cath  . Cardiac pacemaker in situ   . CHF (congestive heart failure) ([place])   . Chronic ulcer of right foot ([place])   . CKD (chronic kidney disease), stage III ([place]) secondary to DM and HTN   nephrologist-  [name]  . Dyspnea   . History of cellulitis    right great toe 10-25-2014  . History of skin cancer   . HOCM (hypertrophic obstructive cardiomyopathy) ([place])   . Hypertension   . Insulin dependent type 2 diabetes mellitus ([place]) 1991   followd by [name]--  has insulin pump  . Insulin pump in place   . OSA on CPAP   . Peripheral neuropathy    severe  . Peripheral vascular disease ([place])    bilateral lower extremities  . Secondary hyperparathyroidism of renal origin ([place])   . Sinus  node dysfunction [facility])     Past Surgical History:  Procedure Laterality Date  . AMPUTATION OF REPLICATED TOES  Mar 6720   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)  . AMPUTATION TOE Right 03/12/2015   Procedure: RIGHT HALLUS AMPUTATION ;  Surgeon: [name], MD;  Location: [facility];  Service: Podiatry;  Laterality: Right;  . CARDIAC CATHETERIZATION  11-25-2010   [place], [place]   Nonobstructive CAD  . CARDIAC PACEMAKER PLACEMENT  Nov 2009   [employer]  . EP IMPLANTABLE DEVICE N/A 06/03/2015   Procedure:  PPM Generator Changeout;  Surgeon: [name], MD;  Location: [facility];  Service: Cardiovascular;  Laterality: N/A;  . EXCISION BONE CYST Right 03/06/2015   Procedure: BONE BIOPSIES OF RIGHT FOOT;  Surgeon: [name], MD;  Location: [facility];  Service: Podiatry;  Laterality: Right;  . ORIF ANKLE FRACTURE Left 11/06/2014   Procedure: OPEN REDUCTION INTERNAL FIXATION (ORIF) LEFT  ANKLE FRACTURE;  Surgeon: [name], MD;  Location: [facility];  Service: Orthopedics;  Laterality: Left;  . TOTAL KNEE ARTHROPLASTY    . VEIN LIGATION AND STRIPPING       Home Medications:  Prior to Admission medications   Medication Sig Start Date End Date Taking? Authorizing Provider  ACCU-CHEK SOFTCLIX LANCETS lancets USE AS INSTRUCTED TO CHECK BLOOD SUGAR 6 TIMES PER DAY Dx: Code E11.9 12/25/15  Yes Elayne Snare, MD  acetaminophen-codeine (TYLENOL #3) 300-30 MG tablet Take 1 tablet by mouth daily as needed. 01/27/17  Yes [provider]  aspirin EC 81 MG tablet Take 81 mg by mouth daily.   Yes [provider]  calcitRIOL (ROCALTROL) 0.25 MCG capsule Take 0.25 mcg by mouth daily.  05/30/14  Yes [provider]  carvedilol (COREG) 25 MG tablet TAKE ONE TABLET BY MOUTH TWICE DAILY WITH A MEAL 01/02/17  Yes [name], FNP  cholecalciferol (VITAMIN D) 1000 units tablet Take 1 tablet (1,000 Units total) by mouth daily. Patient taking differently: Take 1,000 Units by mouth every evening.  02/01/17  Yes [name], MD  doxazosin (CARDURA) 4 MG tablet TAKE ONE TABLET BY MOUTH ONCE DAILY 08/22/16  Yes Elayne Snare, MD  glucose blood (BAYER CONTOUR NEXT TEST) test strip Use to test blood sugar 4 times daily    Dx code is E11.49 11/17/16  Yes Elayne Snare, MD  hydroxypropyl methylcellulose / hypromellose (ISOPTO TEARS / GONIOVISC) 2.5 % ophthalmic solution Place 1 drop into both eyes as needed for dry eyes.   Yes [provider]  Icosapent  Ethyl (VASCEPA) 1 g CAPS 2 caps bid 02/22/17  Yes Elayne Snare, MD  insulin regular human CONCENTRATED (HUMULIN R) 500 UNIT/ML injection USE 0.25 ML DAILY OR AS DIRECTED IN INSULIN PUMP. Dx code E11.65 02/27/17  Yes Elayne Snare, MD  isosorbide mononitrate (IMDUR) 30 MG 24 hr tablet TAKE TWO TABLETS BY MOUTH ONCE DAILY 11/07/16  Yes [name], MD  liraglutide (VICTOZA) 18 MG/3ML SOPN INJECT 0.3MLS (1.8 MG TOTAL) INTO THE SKIN DAILY 05/23/16  Yes Elayne Snare, MD  lisinopril (PRINIVIL,ZESTRIL) 5 MG tablet Take 5 mg by mouth every evening.  07/15/15  Yes [provider]  Omega-3 Fatty Acids (FISH OIL) 1000 MG CAPS Take by mouth 2 (two) times daily.   Yes [provider]  pantoprazole (PROTONIX) 40 MG tablet TAKE ONE TABLET BY MOUTH ONCE DAILY 02/07/17  Yes [name], FNP  REGRANEX 0.01 % gel  12/14/16  Yes [provider]  sertraline (ZOLOFT) 50 MG tablet TAKE ONE TABLET BY MOUTH ONCE DAILY 09/09/16  Yes [name], FNP  torsemide (DEMADEX) 20 MG tablet Take 20 mg by mouth every evening.    Yes [provider]    Inpatient Medications: Scheduled Meds: . aspirin EC  81 mg Oral Daily  . carvedilol  12.5 mg Oral BID WC  . doxazosin  4 mg Oral Daily  . insulin aspart  0-20 Units Subcutaneous Q4H  . insulin glargine  45 Units Subcutaneous BID  . isosorbide mononitrate  60 mg Oral Daily  . pantoprazole  40 mg Oral Daily  . sertraline  50 mg Oral Daily  . torsemide  20 mg Oral QPM   Continuous Infusions: . piperacillin-tazobactam (ZOSYN)  IV Stopped (03/05/17 0723)   PRN Meds: acetaminophen **OR** acetaminophen, morphine injection, ondansetron **OR** ondansetron (ZOFRAN) IV, polyvinyl alcohol  Allergies:   No Known Allergies  Social History:   Social History   Social History  . Marital status: Married    Spouse name: N/A  . Number of children: 3  . Years of education: 12   Occupational History  . Retired    Social History Main Topics  .  Smoking status: Former Smoker    Packs/day: 2.00    Years: 30.00    Quit date: 03/03/1984  . Smokeless tobacco: Former Systems developer  . Alcohol use 0.6 oz/week    1 Glasses of wine per week     Comment: social  . Drug use: No  . Sexual activity: Not Currently   Other Topics Concern  . Not on file   Social History Narrative   Currently resides with his wife. 1 dog. Fun/Hobby: Golf    Denies any religious beliefs effecting health care.     Family History:    Family History  Problem Relation Age of Onset  . Cancer Mother        breast  . Heart attack Father      ROS:  Please see the history of present illness.  ROS  All other ROS reviewed and negative.     Physical Exam/Data:   Vitals:   03/04/17 1638 03/04/17 1825 03/04/17 2134 03/05/17 0607  BP: (!) 157/60 119/85 (!) 158/65 (!) 164/66  Pulse: (!) 58 64 60 60  Resp: 16 16 16 16   Temp: 97.9 F (36.6 C) 98.5 F (36.9 C) 98.5 F (36.9 C) 98.8 F (37.1 C)  TempSrc: Oral Oral Oral Oral  SpO2: 98% 98% 99% 99%  Weight:      Height:        Intake/Output Summary (Last 24 hours) at 03/05/17 0911 Last data filed at 03/05/17 0815  Gross per 24 hour  Intake             1290 ml  Output              250 ml  Net             1040 ml   Filed Weights   03/04/17 0904  Weight: 131.5 kg (290 lb)   VS:  BP (!) 150/43 (BP Location: Right Arm)   Pulse 61   Temp 98 F (36.7 C) (Oral)   Resp 15   Ht 6\' 4"  (1.93 m)   Wt 131.5 kg (290 lb)   SpO2 98%   BMI 35.30 kg/m  , BMI Body mass index is 35.3 kg/m. GENERAL:  Well appearing.  No acute distress.  HEENT: Pupils equal round and reactive, fundi not visualized, oral mucosa unremarkable NECK:  No jugular venous distention, waveform within normal limits, carotid upstroke brisk and symmetric, no bruits, no thyromegaly LYMPHATICS:  No cervical adenopathy LUNGS:  Clear to auscultation bilaterally HEART:  RRR.  PMI not displaced or sustained,S1 and S2 within normal limits, no S3, no S4,  no clicks, no rubs, II/VI systolic murmur at the LUSB ABD:  Central adiposity.  Positive bowel sounds normal in frequency in pitch, no bruits, +RUQ TTP.  No rebound.  +guarding.  No midline pulsatile mass, no hepatomegaly, no splenomegaly EXT:  2 plus pulses throughout, Tense, 1+ LE edema bilaterally, no cyanosis no clubbing SKIN:  No rashes no nodules NEURO:  Cranial nerves II through XII grossly intact, motor grossly intact throughout PSYCH:  Cognitively intact, oriented to person place and time   EKG:  The EKG was personally reviewed and demonstrates:  Pending  Telemetry:  Telemetry was personally reviewed and demonstrates:  n/a  Relevant CV Studies:  Echo 11/11/15: Study Conclusions  - Left ventricle: The cavity size was normal. There was severe   focal basal hypertrophy of the septum with mild posterior wall   hypertrophy. Systolic function was normal. The estimated ejection   fraction was in the range of 60% to 65%. Wall motion was normal;   there were no regional wall motion abnormalities. - Aortic valve: There was trivial regurgitation.  Laboratory Data:  Chemistry Recent Labs Lab 03/04/17 0922 03/04/17 1918 03/05/17 0503  NA 136 140 139  K 5.7* 4.7 5.9*  CL 105 107 109  CO2 25 25 25   GLUCOSE 262* 80 161*  BUN 47* 40* 40*  CREATININE 2.19* 2.19* 2.24*  CALCIUM 9.9 9.6 8.9  GFRNONAA 28* 28* 27*  GFRAA 32* 32* 31*  ANIONGAP 6 8 5      Recent Labs Lab 03/04/17 0922 03/05/17 0503  PROT 7.3 6.1*  ALBUMIN 3.9 3.2*  AST 21 16  ALT 28 22  ALKPHOS 96 82  BILITOT 0.5 0.5   Hematology Recent Labs Lab 03/04/17 0922 03/05/17 0503  WBC 8.7 8.3  RBC 3.71* 3.24*  HGB 11.6* 10.0*  HCT 34.8* 30.4*  MCV 93.8 93.8  MCH 31.3 30.9  MCHC 33.3 32.9  RDW 14.0 14.3  PLT 169 159   Cardiac EnzymesNo results for input(s): TROPONINI in the last 168 hours. No results for input(s): TROPIPOC in the last 168 hours.  BNPNo results for input(s): BNP, PROBNP in the last 168  hours.  DDimer No results for input(s): DDIMER in the last 168 hours.  Radiology/Studies:  Ct Abdomen Pelvis Wo Contrast  Result Date: 03/04/2017 CLINICAL DATA:  Upper abdominal pain with nausea for several days EXAM: CT ABDOMEN AND PELVIS WITHOUT CONTRAST TECHNIQUE: Multidetector CT imaging of the abdomen and pelvis was performed following the standard protocol without IV contrast. COMPARISON:  07/23/2015 FINDINGS: Lower chest: No acute abnormality. Hepatobiliary: The liver is within normal limits. The gallbladder is well distended with some dependent density consistent with small stones or gallbladder sludge. Some suggestion of gallbladder wall thickening is noted although incompletely evaluated on this exam. Ultrasound may be helpful for further evaluation. Pancreas: Stable calcifications in the pancreatic head are noted. No pancreatic mass is seen. No ductal dilatation is noted. Spleen: Normal in size without focal abnormality. Adrenals/Urinary Tract: The adrenal glands are within normal limits. Cystic changes are noted bilaterally. No renal calculi or obstructive changes are seen. The bladder is well distended. Stomach/Bowel: Scattered diverticular change  of the colon is noted without evidence of diverticulitis. Appendix is within normal limits without inflammatory changes. Vascular/Lymphatic: Aortic atherosclerosis. No enlarged abdominal or pelvic lymph nodes. Reproductive: Prostate is unremarkable. Other: No abdominal wall hernia or abnormality. No abdominopelvic ascites. Musculoskeletal: Degenerative changes of lumbar spine are noted. No acute abnormality is seen. IMPRESSION: Well distended gallbladder with some suggestion of gallbladder wall thickening. Layering density likely related to small stones or sludge is noted as well. Ultrasound may be helpful for further evaluation. Chronic changes without acute abnormality. Electronically Signed   By: David [name] [name]   On: 03/04/2017 11:29   US  Abdomen Limited Ruq  Result Date: 03/04/2017 CLINICAL DATA:  Acute abdominal pain and nausea for 2 days. Possible gallbladder wall thickening on CT performed today. EXAM: ULTRASOUND ABDOMEN LIMITED RIGHT UPPER QUADRANT COMPARISON:  03/04/2017 CT FINDINGS: Gallbladder: Gallbladder wall thickening is identified proximally measuring up to 5 mm. Cholelithiasis noted with the largest calculus measuring 9 mm. No pericholecystic fluid identified. No definite sonographic Murphy sign but the patient is on pain medication. Common bile duct: Diameter: 6 mm.  No intrahepatic or extrahepatic biliary dilatation. Liver: No focal lesion identified. Within normal limits in parenchymal echogenicity. Portal vein is patent on color Doppler imaging with normal direction of blood flow towards the liver. IMPRESSION: Proximal gallbladder wall thickening with cholelithiasis, suspicious for acute cholecystitis. No biliary dilatation. Electronically Signed   By: David [name] [name]   On: 03/04/2017 12:41    Assessment and Plan:   # Pre-surgical risk assessment:   David [name] has several risk factors: HCM, CKD III, insulin-dependent DM, PAD, and OSA.  However, he not have any unstable cardiac conditions.  He is mildly volume overloaded but Upon evaluation today, he can achieve 4 METs or greater without anginal symptoms.  According to [facility] and AHA guidelines, he requires no further cardiac workup prior to his noncardiac surgery and should be at acceptable risk.  his RCRI risk of peri-procedural MI or cardiac arrest is 6.6%.  NSQIP risk of death is 1.3%.  He is at moderate but acceptable risk due to his underlying disease processes.  However, he is stable and further testing will not lower this risk.  Our service is available as necessary in the perioperative period.  This was discussed with the patient and his family.   # Non-obstructive CAD: No angina.  Contiue aspirin, Imdur, and carvedilol.  LDL was 94 02/2017.  Should be <70 and he  should be on a statin.  Will address post-op.   # HCM:  # Hypertensive heart disease:  BP is poorly-controlled.  Continue carvedilol, doxazosin, Imdur and torsemide.  We will help with titrating meds perioperatively.   # s/p PPM: Device interrogated remotely 02/21/17.   Normal function.    For questions or updates, please contact [place] Please consult [email or web address] for contact info under Cardiology/STEMI.   Signed, David Latch, MD  03/05/2017 9:11 AM

## 2017-03-05 NOTE — Op Note (Signed)
03/05/2017  PATIENT:  David [name]  76 y.o. male  Patient Care Team: [name], MD as PCP - General (Family Medicine) [name], MD as Consulting Physician (Podiatry) [name], MD as Consulting Physician (Nephrology)  PRE-OPERATIVE DIAGNOSIS:    Acute on Chronic Calculus Cholecystitis  POST-OPERATIVE DIAGNOSIS:   Acute on Chronic Calculus Cholecystitis Umbilical hernia Liver: Fatty steatohepatitis  PROCEDURE:  Laparoscopic cholecystectomy with intraoperative cholangiogram  SURGEON:  [name], MD, FACS.  ASSISTANT: RNFA   ANESTHESIA:    General with endotracheal intubation Local anesthetic as a field block  EBL:  (See Anesthesia Intraoperative Record) Total I/O In: -  Out: 375 [Urine:350; Blood:25]   Delay start of Pharmacological VTE agent (>24hrs) due to surgical blood loss or risk of bleeding:  no  DRAINS: None   SPECIMEN: Gallbladder    DISPOSITION OF SPECIMEN:  PATHOLOGY  COUNTS:  YES  PLAN OF CARE: Admit to inpatient   PATIENT DISPOSITION:  PACU - hemodynamically stable.  INDICATION: morbidly obese insulin requiring diabetic male with cardiomyopathy and chronic kidney disease.  Episode of biliary colic and pain persistent.  Exam and ultrasound concerning for cholecystitis.  Medically stabilized.  Cleared by medicine and cardiology.  Above average risk but relatively stable. I partner, Dr. Lucia Gaskins, and myself recommended consideration of cholecystectomy.  The anatomy & physiology of hepatobiliary & pancreatic function was discussed.  The pathophysiology of gallbladder dysfunction was discussed.  Natural history risks without surgery was discussed.   I feel the risks of no intervention will lead to serious problems that outweigh the operative risks; therefore, I recommended cholecystectomy to remove the pathology.  I explained laparoscopic techniques with possible need for an open approach.  Probable cholangiogram to evaluate the  bilary tract was explained as well.    Risks such as bleeding, infection, abscess, leak, injury to other organs, need for further treatment, heart attack, death, and other risks were discussed.  I noted a good likelihood this will help address the problem.  Possibility that this will not correct all abdominal symptoms was explained.  Goals of post-operative recovery were discussed as well.  We will work to minimize complications.  An educational handout further explaining the pathology and treatment options was given as well.  Questions were answered.  The patient expresses understanding & wishes to proceed with surgery.  OR FINDINGS: thickened edematous gallbladder consistent with acute on chronic cholecystitis.  Moderate inflammation at the infundibulum.  Smaller black gallstones and cystic duct.  Intraoperative cholangiogram shows no evidence of any choledocholithiasis.  No bile duct tumor.  Liver: Fatty steatohepatitis  DESCRIPTION:   Informed consent was confirmed.  The patient underwent general anaesthesia without difficulty.  The patient was positioned appropriately.  VTE prevention in place.  The patient's abdomen was clipped, prepped, & draped in a sterile fashion.  Surgical timeout confirmed our plan.  Peritoneal entry with a laparoscopic port was obtained using optical entry technique in the right upper abdomen as the patient was positioned in reverse Trendelenburg.  Entry was clean.  I induced carbon dioxide insufflation.  Camera inspection revealed no injury.  Extra ports were carefully placed under direct laparoscopic visualization.  [address] Placed higher in the right upper quadrant given his massive obesity and diastases.  I stayed away from his small but flat 6 mm umbilical hernia  I turned attention to the right upper quadrant.  He had a rather large but floppy liver with some early fatty change within it.  No cirrhosis  though.  I was able to elevated to reveal an edematous and  inflamed gallbladder consistent with cholecystitis.  The gallbladder fundus was elevated cephalad. I used hook cautery to free the peritoneal coverings between the gallbladder and the liver on the posteriolateral and anteriomedial walls.   I used careful blunt and hook dissection to help get a good critical view of the cystic artery and cystic duct. I did further dissection to free a few centimeters of the gallbladder off the liver bed to get a good critical view of the infundibulum and cystic duct.I dissected out the cystic artery; and, after getting a good 360 view, ligated the anterior & posterior branches of the cystic artery close on the infundibulum.  The branches were rather narrow and wispy, controlled with clips and cautery.  I skeletonized the cystic duct.  I placed a clip on the infundibulum. I did a partial cystic duct-otomy at the infundibulum junction and ensured patency.  Ended up milking back a few small black ellipsoid stones.  Some flakes as well.  I placed a 5 [place] cholangiocatheter through a puncture site at the right subcostal ridge of the abdominal wall and directed it into the cystic duct.  Was hard to get a perfect seal around the cystic duct.  However I could get slow gradual flow after repositioning re-clipping.  We ran a cholangiogram with dilute radio-opaque contrast and continuous fluoroscopy. Contrast flowed from a side branch consistent with cystic duct cannulization.  There is some leaking around the cystic duct/infundibulum but nowhere else.  Contrast flowed up the common hepatic duct into the right and left intrahepatic chains out to secondary radicals. Contrast flowed down the common bile duct easily across the normal ampulla into the duodenum.  This was consistent with a normal cholangiogram.  I removed the cholangiocatheter.  I completed cystic duct transection.  Because of the inflammation and thickening of the cystic duct, I ligated the stump with a 0 PDS Endoloop.  Then  placed three clips.  I also placed an Endoloop around the infundibulum of the gallbladder as well for control of the gallbladder and since the tissues were poor and clip on the infundibulum did not stay well.  I freed the gallbladder from its remaining attachments to the liver. I ensured hemostasis on the gallbladder fossa of the liver and elsewhere. I inspected the rest of the abdomen & detected no injury nor bleeding elsewhere.  Did careful inspection.  No bile or stones.  I placed the gallbladder inside an ache oh sac.  I removed the gallbladder out the subxiphoid port site since it overlaid the liver and would have less risk of a hernia issue.  I closed the subxiphoid fascia transversely using #1 PDS interrupted stitches. I closed the skin using 4-0 monocryl stitch.  Sterile dressings were applied. The patient was extubated & arrived in the PACU in stable condition.  Because of his many cardiopulmonary and diabetic comorbidities, we will watch him in the stepdown unit overnight.  Medicine and anesthesia agree.  I had discussed postoperative care with the patient in the holding area. I discussed operative findings, updated the patient's status, discussed probable steps to recovery, and gave postoperative recommendations to the patient's family.  Recommendations were made.  Questions were answered.  They expressed understanding & appreciation.  [name], [name], F.A.C.S. Gastrointestinal and Minimally Invasive Surgery [facility] [address] [place], [place] [id number] [phone] Main / Paging  03/05/2017 1:37 PM

## 2017-03-06 ENCOUNTER — Encounter (HOSPITAL_COMMUNITY): Payer: Self-pay | Admitting: Surgery

## 2017-03-06 DIAGNOSIS — K8000 Calculus of gallbladder with acute cholecystitis without obstruction: Secondary | ICD-10-CM

## 2017-03-06 LAB — CBC
HCT: 28.3 % — ABNORMAL LOW (ref 39.0–52.0)
Hemoglobin: 9.3 g/dL — ABNORMAL LOW (ref 13.0–17.0)
MCH: 30.8 pg (ref 26.0–34.0)
MCHC: 32.9 g/dL (ref 30.0–36.0)
MCV: 93.7 fL (ref 78.0–100.0)
Platelets: 150 10*3/uL (ref 150–400)
RBC: 3.02 MIL/uL — ABNORMAL LOW (ref 4.22–5.81)
RDW: 14.2 % (ref 11.5–15.5)
WBC: 8 10*3/uL (ref 4.0–10.5)

## 2017-03-06 LAB — BASIC METABOLIC PANEL
Anion gap: 9 (ref 5–15)
BUN: 41 mg/dL — ABNORMAL HIGH (ref 6–20)
CO2: 23 mmol/L (ref 22–32)
Calcium: 8.6 mg/dL — ABNORMAL LOW (ref 8.9–10.3)
Chloride: 103 mmol/L (ref 101–111)
Creatinine, Ser: 2.3 mg/dL — ABNORMAL HIGH (ref 0.61–1.24)
GFR calc Af Amer: 30 mL/min — ABNORMAL LOW (ref >60)
GFR calc non Af Amer: 26 mL/min — ABNORMAL LOW (ref >60)
Glucose, Bld: 249 mg/dL — ABNORMAL HIGH (ref 65–99)
Potassium: 5 mmol/L (ref 3.5–5.1)
Sodium: 135 mmol/L (ref 135–145)

## 2017-03-06 LAB — COMPREHENSIVE METABOLIC PANEL
ALT: 50 U/L (ref 17–63)
AST: 34 U/L (ref 15–41)
Albumin: 3.1 g/dL — ABNORMAL LOW (ref 3.5–5.0)
Alkaline Phosphatase: 85 U/L (ref 38–126)
Anion gap: 5 (ref 5–15)
BUN: 42 mg/dL — ABNORMAL HIGH (ref 6–20)
CO2: 25 mmol/L (ref 22–32)
Calcium: 8.7 mg/dL — ABNORMAL LOW (ref 8.9–10.3)
Chloride: 107 mmol/L (ref 101–111)
Creatinine, Ser: 2.3 mg/dL — ABNORMAL HIGH (ref 0.61–1.24)
GFR calc Af Amer: 30 mL/min — ABNORMAL LOW (ref >60)
GFR calc non Af Amer: 26 mL/min — ABNORMAL LOW (ref >60)
Glucose, Bld: 220 mg/dL — ABNORMAL HIGH (ref 65–99)
Potassium: 5.7 mmol/L — ABNORMAL HIGH (ref 3.5–5.1)
Sodium: 137 mmol/L (ref 135–145)
Total Bilirubin: 0.7 mg/dL (ref 0.3–1.2)
Total Protein: 6.1 g/dL — ABNORMAL LOW (ref 6.5–8.1)

## 2017-03-06 LAB — GLUCOSE, CAPILLARY
Glucose-Capillary: 220 mg/dL — ABNORMAL HIGH (ref 65–99)
Glucose-Capillary: 328 mg/dL — ABNORMAL HIGH (ref 65–99)
Glucose-Capillary: 164 mg/dL — ABNORMAL HIGH (ref 65–99)

## 2017-03-06 MED ORDER — SODIUM BICARBONATE 8.4 % IV SOLN
50.0000 meq | Freq: Once | INTRAVENOUS | Status: AC
  Administered 2017-03-06: 50 meq via INTRAVENOUS
  Filled 2017-03-06: qty 50

## 2017-03-06 MED ORDER — SODIUM POLYSTYRENE SULFONATE 15 GM/60ML PO SUSP
30.0000 g | Freq: Once | ORAL | Status: AC
  Administered 2017-03-06: 30 g via ORAL
  Filled 2017-03-06: qty 120

## 2017-03-06 MED ORDER — INSULIN PUMP
Freq: Three times a day (TID) | SUBCUTANEOUS | Status: DC
Start: 2017-03-06 — End: 2017-03-07
  Administered 2017-03-06 (×2): via SUBCUTANEOUS
  Administered 2017-03-07: 12 via SUBCUTANEOUS
  Administered 2017-03-07: 02:00:00 via SUBCUTANEOUS
  Filled 2017-03-06: qty 1

## 2017-03-06 NOTE — Progress Notes (Signed)
1 Day Post-Op   Subjective/Chief Complaint: Pt with some abd soreness.   Tol CLD    Objective: Vital signs in last 24 hours: Temp:  [97.6 F (36.4 C)-98.1 F (36.7 C)] 97.8 F (36.6 C) (10/22 0317) Pulse Rate:  [60-70] 60 (10/22 0700) Resp:  [12-18] 18 (10/22 0700) BP: (139-186)/(39-56) 174/48 (10/22 0700) SpO2:  [91 %-100 %] 93 % (10/22 0700) Arterial Line BP: (164)/(49) 164/49 (10/21 1339) Last BM Date: 03/03/17  Intake/Output from previous day: 10/21 0701 - 10/22 0700 In: 1486.7 [P.O.:120; I.V.:1166.7; IV Piggyback:200] Out: 926 [Urine:901; Blood:25] Intake/Output this shift: No intake/output data recorded.  General appearance: alert and cooperative GI: soft, non-tender; bowel sounds normal; no masses,  no organomegaly and incisions dressed, c/d/i  Lab Results:   Recent Labs  03/05/17 0503 03/06/17 0401  WBC 8.3 8.0  HGB 10.0* 9.3*  HCT 30.4* 28.3*  PLT 159 150   BMET  Recent Labs  03/05/17 0503 03/05/17 0925 03/06/17 0401  NA 139  --  137  K 5.9* 5.8* 5.7*  CL 109  --  107  CO2 25  --  25  GLUCOSE 161*  --  220*  BUN 40*  --  42*  CREATININE 2.24*  --  2.30*  CALCIUM 8.9  --  8.7*   PT/INR No results for input(s): LABPROT, INR in the last 72 hours. ABG No results for input(s): PHART, HCO3 in the last 72 hours.  Invalid input(s): PCO2, PO2  Studies/Results: Ct Abdomen Pelvis Wo Contrast  Result Date: 03/04/2017 CLINICAL DATA:  Upper abdominal pain with nausea for several days EXAM: CT ABDOMEN AND PELVIS WITHOUT CONTRAST TECHNIQUE: Multidetector CT imaging of the abdomen and pelvis was performed following the standard protocol without IV contrast. COMPARISON:  07/23/2015 FINDINGS: Lower chest: No acute abnormality. Hepatobiliary: The liver is within normal limits. The gallbladder is well distended with some dependent density consistent with small stones or gallbladder sludge. Some suggestion of gallbladder wall thickening is noted although  incompletely evaluated on this exam. Ultrasound may be helpful for further evaluation. Pancreas: Stable calcifications in the pancreatic head are noted. No pancreatic mass is seen. No ductal dilatation is noted. Spleen: Normal in size without focal abnormality. Adrenals/Urinary Tract: The adrenal glands are within normal limits. Cystic changes are noted bilaterally. No renal calculi or obstructive changes are seen. The bladder is well distended. Stomach/Bowel: Scattered diverticular change of the colon is noted without evidence of diverticulitis. Appendix is within normal limits without inflammatory changes. Vascular/Lymphatic: Aortic atherosclerosis. No enlarged abdominal or pelvic lymph nodes. Reproductive: Prostate is unremarkable. Other: No abdominal wall hernia or abnormality. No abdominopelvic ascites. Musculoskeletal: Degenerative changes of lumbar spine are noted. No acute abnormality is seen. IMPRESSION: Well distended gallbladder with some suggestion of gallbladder wall thickening. Layering density likely related to small stones or sludge is noted as well. Ultrasound may be helpful for further evaluation. Chronic changes without acute abnormality. Electronically Signed   By: Inez Catalina [name]   On: 03/04/2017 11:29   Dg Cholangiogram Operative  Result Date: 03/05/2017 CLINICAL DATA:  Cholecystitis EXAM: INTRAOPERATIVE CHOLANGIOGRAM TECHNIQUE: Cholangiographic images from the C-arm fluoroscopic device were submitted for interpretation post-operatively. Please see the procedural report for the amount of contrast and the fluoroscopy time utilized. COMPARISON:  03/04/2017 FINDINGS: Intraoperative cholangiogram performed during the laparoscopic procedure. There is leakage at the injection catheter site along the cystic duct. Residual cystic duct, biliary confluence, common hepatic duct, and common bile duct are patent. Contrast does drain into  the duodenum. IMPRESSION: Patent biliary system. Electronically  Signed   By: Jerilynn Mages.  Shick [name]   On: 03/05/2017 15:03   US Abdomen Limited Ruq  Result Date: 03/04/2017 CLINICAL DATA:  Acute abdominal pain and nausea for 2 days. Possible gallbladder wall thickening on CT performed today. EXAM: ULTRASOUND ABDOMEN LIMITED RIGHT UPPER QUADRANT COMPARISON:  03/04/2017 CT FINDINGS: Gallbladder: Gallbladder wall thickening is identified proximally measuring up to 5 mm. Cholelithiasis noted with the largest calculus measuring 9 mm. No pericholecystic fluid identified. No definite sonographic Murphy sign but the patient is on pain medication. Common bile duct: Diameter: 6 mm.  No intrahepatic or extrahepatic biliary dilatation. Liver: No focal lesion identified. Within normal limits in parenchymal echogenicity. Portal vein is patent on color Doppler imaging with normal direction of blood flow towards the liver. IMPRESSION: Proximal gallbladder wall thickening with cholelithiasis, suspicious for acute cholecystitis. No biliary dilatation. Electronically Signed   By: Margarette Canada [name]   On: 03/04/2017 12:41    Anti-infectives: Anti-infectives    Start     Dose/Rate Route Frequency Ordered Stop   03/05/17 2200  piperacillin-tazobactam (ZOSYN) IVPB 3.375 g     3.375 g 12.5 mL/hr over 240 Minutes Intravenous Every 8 hours 03/05/17 1458 03/06/17 1359   03/05/17 1210  piperacillin-tazobactam (ZOSYN) 3.375 GM/50ML IVPB    Comments:  Mackie Pai   : cabinet override      03/05/17 1210 03/05/17 1206   03/04/17 2200  piperacillin-tazobactam (ZOSYN) IVPB 3.375 g  Status:  Discontinued     3.375 g 12.5 mL/hr over 240 Minutes Intravenous Every 8 hours 03/04/17 1824 03/05/17 1458   03/04/17 1300  piperacillin-tazobactam (ZOSYN) IVPB 3.375 g     3.375 g 100 mL/hr over 30 Minutes Intravenous  Once 03/04/17 1259 03/04/17 1346      Assessment/Plan: s/p Procedure(s): LAPAROSCOPIC CHOLECYSTECTOMY WITH INTRAOPERATIVE CHOLANGIOGRAM (N/A) Advance diet as tol Ok from surgical  standpoint for DC when medically stable Encourage ambulation  LOS: 2 days    Rosario Jacks., Anne Hahn 03/06/2017

## 2017-03-06 NOTE — Progress Notes (Signed)
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  Presented to the emergency department and was found to have cholelithiasis with suspicion for cholecystitis.  Patient was hospitalized for further management.  Patient subsequently underwent laparoscopic cholecystectomy.  Reason for Visit: Cholelithiasis   Consultants: General surgery.  Cardiology.  Procedures: Laparoscopic cholecystectomy 10/21  Antibiotics: Zosyn.  Discontinued today.  Subjective/Interval History: Patient feels well this morning.  Some upper abdominal discomfort especially when he moves.  Denies any nausea vomiting.  Passing some gas from below.  Denies any chest pain shortness of breath.    ROS: Denies any headaches.  Objective:  Vital Signs  Vitals:   03/06/17 0000 03/06/17 0317 03/06/17 0400 03/06/17 0700  BP: (!) 153/39  (!) 139/39 (!) 174/48  Pulse: 60  61 60  Resp: 15  15 18   Temp:  97.8 F (36.6 C)    TempSrc:  Axillary    SpO2: 93%  95% 93%  Weight:      Height:        Intake/Output Summary (Last 24 hours) at 03/06/17 0755 Last data filed at 03/06/17 0000  Gross per 24 hour  Intake          1486.67 ml  Output              926 ml  Net           560.67 ml   Filed Weights   03/04/17 0904  Weight: 131.5 kg (290 lb)    General appearance: Awake alert.  In no distress. Resp: Clear to auscultation bilaterally.  No wheezing rales or rhonchi Cardio: S1-S2 is normal regular.  No S3-S4.  No rubs murmurs or bruit GI: Abdomen is soft.  Mildly tender in the upper abdomen  without any rebound rigidity or guarding.  Bowel sounds present.  No masses organomegaly. Extremities: He does have minimal edema bilateral lower extremities Neurologic: No obvious focal neurological deficits  Lab Results:  Data Reviewed: I have personally reviewed following labs and imaging studies  CBC:  Recent Labs Lab 03/04/17 0922 03/05/17 0503 03/06/17 0401  WBC 8.7 8.3 8.0  NEUTROABS 7.0  --   --   HGB 11.6* 10.0* 9.3*  HCT 34.8* 30.4* 28.3*  MCV 93.8 93.8 93.7  PLT 169 159 174    Basic Metabolic Panel:  Recent Labs Lab 03/04/17 0922 03/04/17 1918 03/05/17 0503 03/05/17 0925 03/06/17 0401  NA 136 140 139  --  137  K 5.7* 4.7 5.9* 5.8* 5.7*  CL 105 107 109  --  107  CO2 25 25 25   --  25  GLUCOSE 262* 80 161*  --  220*  BUN 47* 40* 40*  --  42*  CREATININE 2.19* 2.19* 2.24*  --  2.30*  CALCIUM 9.9 9.6 8.9  --  8.7*    GFR: Estimated Creatinine Clearance: 40.5 mL/min (A) (by C-G formula based on SCr of 2.3 mg/dL (H)).  Liver Function Tests:  Recent Labs Lab 03/04/17 0922 03/05/17 0503 03/06/17 0401  AST 21 16 34  ALT 28 22 50  ALKPHOS 96 82 85  BILITOT 0.5 0.5  0.7  PROT 7.3 6.1* 6.1*  ALBUMIN 3.9 3.2* 3.1*     Recent Labs Lab 03/04/17 0922  LIPASE 29   CBG:  Recent Labs Lab 03/05/17 0424 03/05/17 0749 03/05/17 1426 03/05/17 2110 03/06/17 0731  GLUCAP 147* 141* 268* 278* 220*     Radiology Studies: Ct Abdomen Pelvis Wo Contrast  Result Date: 03/04/2017 CLINICAL DATA:  Upper abdominal pain with nausea for several days EXAM: CT ABDOMEN AND PELVIS WITHOUT CONTRAST TECHNIQUE: Multidetector CT imaging of the abdomen and pelvis was performed following the standard protocol without IV contrast. COMPARISON:  07/23/2015 FINDINGS: Lower chest: No acute abnormality. Hepatobiliary: The liver is within normal limits. The gallbladder is well distended with some dependent density consistent with small stones or gallbladder sludge. Some suggestion  of gallbladder wall thickening is noted although incompletely evaluated on this exam. Ultrasound may be helpful for further evaluation. Pancreas: Stable calcifications in the pancreatic head are noted. No pancreatic mass is seen. No ductal dilatation is noted. Spleen: Normal in size without focal abnormality. Adrenals/Urinary Tract: The adrenal glands are within normal limits. Cystic changes are noted bilaterally. No renal calculi or obstructive changes are seen. The bladder is well distended. Stomach/Bowel: Scattered diverticular change of the colon is noted without evidence of diverticulitis. Appendix is within normal limits without inflammatory changes. Vascular/Lymphatic: Aortic atherosclerosis. No enlarged abdominal or pelvic lymph nodes. Reproductive: Prostate is unremarkable. Other: No abdominal wall hernia or abnormality. No abdominopelvic ascites. Musculoskeletal: Degenerative changes of lumbar spine are noted. No acute abnormality is seen. IMPRESSION: Well distended gallbladder with some suggestion of gallbladder wall thickening. Layering density likely related to small stones or sludge is noted as well. Ultrasound may be helpful for further evaluation. Chronic changes without acute abnormality. Electronically Signed   By: Inez Catalina [name]   On: 03/04/2017 11:29   Dg Cholangiogram Operative  Result Date: 03/05/2017 CLINICAL DATA:  Cholecystitis EXAM: INTRAOPERATIVE CHOLANGIOGRAM TECHNIQUE: Cholangiographic images from the C-arm fluoroscopic device were submitted for interpretation post-operatively. Please see the procedural report for the amount of contrast and the fluoroscopy time utilized. COMPARISON:  03/04/2017 FINDINGS: Intraoperative cholangiogram performed during the laparoscopic procedure. There is leakage at the injection catheter site along the cystic duct. Residual cystic duct, biliary confluence, common hepatic duct, and common bile duct are patent. Contrast does drain into the duodenum.  IMPRESSION: Patent biliary system. Electronically Signed   By: Jerilynn Mages.  Shick [name]   On: 03/05/2017 15:03   US Abdomen Limited Ruq  Result Date: 03/04/2017 CLINICAL DATA:  Acute abdominal pain and nausea for 2 days. Possible gallbladder wall thickening on CT performed today. EXAM: ULTRASOUND ABDOMEN LIMITED RIGHT UPPER QUADRANT COMPARISON:  03/04/2017 CT FINDINGS: Gallbladder: Gallbladder wall thickening is identified proximally measuring up to 5 mm. Cholelithiasis noted with the largest calculus measuring 9 mm. No pericholecystic fluid identified. No definite sonographic Murphy sign but the patient is on pain medication. Common bile duct: Diameter: 6 mm.  No intrahepatic or extrahepatic biliary dilatation. Liver: No focal lesion identified. Within normal limits in parenchymal echogenicity. Portal vein is patent on color Doppler imaging with normal direction of blood flow towards the liver. IMPRESSION: Proximal gallbladder wall thickening with cholelithiasis, suspicious for acute cholecystitis. No biliary dilatation. Electronically Signed   By: Margarette Canada [name]   On: 03/04/2017 12:41     Medications:  Scheduled: . acetaminophen  1,000 mg Oral TID  . aspirin EC  81 mg Oral Daily  . carvedilol  12.5 mg Oral BID WC  .  Chlorhexidine Gluconate Cloth  6 each Topical Once  . Chlorhexidine Gluconate Cloth  6 each Topical Q0600  . doxazosin  4 mg Oral Daily  . feeding supplement (GLUCERNA SHAKE)  237 mL Oral BID BM  . gabapentin  300 mg Oral QHS  . insulin aspart  0-20 Units Subcutaneous Q4H  . insulin glargine  15 Units Subcutaneous BID  . isosorbide mononitrate  60 mg Oral Daily  . lip balm  1 application Topical BID  . pantoprazole  40 mg Oral Daily  . psyllium  1 packet Oral Daily  . sertraline  50 mg Oral Daily  . sodium chloride flush  3 mL Intravenous Q12H  . torsemide  20 mg Oral QPM   Continuous: . sodium chloride    . lactated ringers    . methocarbamol (ROBAXIN)  IV     FIE:PPIRJJ  chloride, acetaminophen **OR** acetaminophen, albuterol, alum & mag hydroxide-simeth, bisacodyl, diphenhydrAMINE, diphenhydrAMINE, guaiFENesin-dextromethorphan, hydrALAZINE, hydrocortisone, hydrocortisone cream, lactated ringers, magic mouthwash, menthol-cetylpyridinium, methocarbamol (ROBAXIN)  IV, methocarbamol, metoCLOPramide (REGLAN) injection, metoprolol tartrate, naphazoline-glycerin, ondansetron **OR** ondansetron (ZOFRAN) IV, phenol, polyethylene glycol, polyvinyl alcohol, prochlorperazine, sodium chloride flush, traMADol  Assessment/Plan:  Principal Problem:   Acute cholecystitis s/p lap cholecystectomy 03/05/2017 Active Problems:   Hypertension associated with diabetes ([place])   Hyperlipidemia associated with type 2 diabetes mellitus ([place])   Morbid obesity ([place])   CKD stage 3 due to type 2 diabetes mellitus ([place])   OSA on CPAP   Pacemaker   HOCM (hypertrophic obstructive cardiomyopathy) ([facility])   IDDM (insulin dependent diabetes mellitus) - on insulin pump    Cholelithiasis with concern for acute cholecystitis Patient's LFTs were normal.  Ultrasound and CT scan suggested cholelithiasis with possible cholecystitis.  Patient was seen by general surgery.  Seen by cardiology.  He underwent laparoscopic cholecystectomy yesterday.  No stones in the bile duct on cholangiogram.  He seems to be medically stable.  General surgery advancing his diet.  Pain is reasonably well controlled.  Mobilize.  History of hypertrophic cardiomyopathy with history of sinus node dysfunction status post pacemaker Appears to be stable.  Seen by cardiology prior to surgery.  Currently stable.  History of nonobstructive CAD Last cardiac catheterization was in 2012 as per cardiology notes.  Does not have any history of PCI/angioplasty.  He is on aspirin beta blocker nitrates which will be continued.  Elevated blood pressure most likely due to pain and discomfort.  Continue to monitor for now.  History of chronic  kidney disease stage III/hyperkalemia Renal function is close to baseline.  Potassium again noted to be elevated today.  He will be given Kayexalate and sodium bicarbonate.  Labs will be repeated later today.  Monitor urine output.    History of insulin-dependent diabetes mellitus on insulin pump He is followed by endocrinology.  HbA1c was 8.2 on October 5.  Patient was taken off of his pump due to n.p.o. status.  Was placed on Lantus.  CBGs noted to be elevated.  Continue for now.  If he tolerates his diet okay he can be placed back on his insulin pump either later today or tomorrow.  Continue SSI.    History of obstructive sleep apnea Continue CPAP.  Normocytic anemia likely due to anemia of chronic kidney disease Drop in hemoglobin is noted.  Possibly due to dilution and operative loss.  No evidence for overt bleeding otherwise.  Continue to monitor for now.    Morbid obesity Body mass index is 35.3 kg/m.  DVT Prophylaxis: SCDs    Code Status: FullCode Family Communication: Discussed with the patient Disposition Plan: Patient is stable after his surgery.  Okay for transfer to floor.  Mobilize.  Anticipate discharge in 24-48 hours.    LOS: 2 days   [facility] Pager [phone] 03/06/2017, 7:55 AM  If 7PM-7AM, please contact night-coverage at [email or web address], password [facility]

## 2017-03-06 NOTE — Progress Notes (Addendum)
Inpatient Diabetes Program Recommendations  AACE/ADA: New Consensus Statement on Inpatient Glycemic Control (2015)  Target Ranges:  Prepandial:   less than 140 mg/dL      Peak postprandial:   less than 180 mg/dL (1-2 hours)      Critically ill patients:  140 - 180 mg/dL   Results for ASENCION, LOVEDAY (MRN [id number]) as of 03/06/2017 14:02  Ref. Range 03/06/2017 07:31 03/06/2017 11:28  Glucose-Capillary Latest Ref Range: 65 - 99 mg/dL 220 (H) 328 (H)    Home DM Meds: Insulin pump with Humulin R U500 insulin       Victoza 1.8 mg daily  Current Orders: Lantus 15 units BID     Novolog Resistant Correction Scale/ SSI (0-20 units) Q4 hours      MD- Spoke with pt and his wife today.  Patient states he feels like he is ready to resume his home insulin pump.  Wife states she will go home and get all insulin pump supplies this afternoon.  Patient concerned about his elevated CBGs.  Patient A&O and able to independently manage his insulin pump.  Desires to resume his pump asap.  Tolerated [place] diet this afternoon.  States he still feels hungry.  Discussed with pt that if the MD allows him to resume his insulin pump that we will still need to check his CBGs with the hospital meter and document all his insulin boluses he gives to himself.  Patient agreeable to this.   MD- Please allow pt to resume home insulin pump this afternoon.  Will need to place orders for "Insulin Pump Order set"       --Will follow patient during hospitalization--  Wyn Quaker RN, MSN, CDE Diabetes Coordinator Inpatient Glycemic Control Team Team Pager: [phone] (8a-5p)

## 2017-03-06 NOTE — Addendum Note (Signed)
Addendum  created 03/06/17 0620 by Lollie Sails, CRNA   Charge Capture section accepted

## 2017-03-07 ENCOUNTER — Telehealth: Payer: Self-pay | Admitting: *Deleted

## 2017-03-07 LAB — COMPREHENSIVE METABOLIC PANEL
ALT: 39 U/L (ref 17–63)
AST: 23 U/L (ref 15–41)
Albumin: 3.2 g/dL — ABNORMAL LOW (ref 3.5–5.0)
Alkaline Phosphatase: 83 U/L (ref 38–126)
Anion gap: 9 (ref 5–15)
BUN: 40 mg/dL — ABNORMAL HIGH (ref 6–20)
CO2: 27 mmol/L (ref 22–32)
Calcium: 8.7 mg/dL — ABNORMAL LOW (ref 8.9–10.3)
Chloride: 103 mmol/L (ref 101–111)
Creatinine, Ser: 2.36 mg/dL — ABNORMAL HIGH (ref 0.61–1.24)
GFR calc Af Amer: 29 mL/min — ABNORMAL LOW (ref >60)
GFR calc non Af Amer: 25 mL/min — ABNORMAL LOW (ref >60)
Glucose, Bld: 99 mg/dL (ref 65–99)
Potassium: 4.4 mmol/L (ref 3.5–5.1)
Sodium: 139 mmol/L (ref 135–145)
Total Bilirubin: 0.7 mg/dL (ref 0.3–1.2)
Total Protein: 6.3 g/dL — ABNORMAL LOW (ref 6.5–8.1)

## 2017-03-07 LAB — GLUCOSE, CAPILLARY
Glucose-Capillary: 173 mg/dL — ABNORMAL HIGH (ref 65–99)
Glucose-Capillary: 59 mg/dL — ABNORMAL LOW (ref 65–99)
Glucose-Capillary: 68 mg/dL (ref 65–99)
Glucose-Capillary: 132 mg/dL — ABNORMAL HIGH (ref 65–99)

## 2017-03-07 LAB — CBC
HCT: 29.3 % — ABNORMAL LOW (ref 39.0–52.0)
Hemoglobin: 9.6 g/dL — ABNORMAL LOW (ref 13.0–17.0)
MCH: 30.6 pg (ref 26.0–34.0)
MCHC: 32.8 g/dL (ref 30.0–36.0)
MCV: 93.3 fL (ref 78.0–100.0)
Platelets: 150 10*3/uL (ref 150–400)
RBC: 3.14 MIL/uL — ABNORMAL LOW (ref 4.22–5.81)
RDW: 14 % (ref 11.5–15.5)
WBC: 6.8 10*3/uL (ref 4.0–10.5)

## 2017-03-07 NOTE — Progress Notes (Addendum)
Patient administered own insulin via pump 12 units this am. Will continue to monitor.

## 2017-03-07 NOTE — Progress Notes (Signed)
Hypoglycemic Event  CBG: 59  Treatment: 15 GM carbohydrate snack  Symptoms: Hungry  Follow-up CBG: Time: 0545 CBG Result: 68  Possible Reasons for Event: Medication regimen: pt self regulating insulin pump and Change in activity  Comments/MD notified: [place], NP, notified.    [place]

## 2017-03-07 NOTE — Progress Notes (Signed)
Patient is alert oriented x4 ambulatory with walker. Discharge instructions reviewed, questions, concerns denied.

## 2017-03-07 NOTE — Care Management Note (Signed)
Case Management Note  Patient Details  Name: LESLIE LANGILLE MRN: [id number] Date of Birth: 10-16-1940  Subjective/Objective:  No CM orders or needs.                  Action/Plan:d/c home.   Expected Discharge Date:  03/07/17               Expected Discharge Plan:  Home/Self Care  In-House Referral:     Discharge planning Services  CM Consult  Post Acute Care Choice:    Choice offered to:     DME Arranged:    DME Agency:     HH Arranged:    HH Agency:     Status of Service:  Completed, signed off  If discussed at [employer] of Stay Meetings, dates discussed:    Additional Comments:  Dessa Phi, RN 03/07/2017, 11:05 AM

## 2017-03-07 NOTE — Discharge Instructions (Signed)
LAPAROSCOPIC SURGERY: POST OP INSTRUCTIONS   EAT Gradually transition to a high fiber diet with a fiber supplement over the next few weeks after discharge.  Start with a pureed / full liquid diet (see below)  WALK Walk an hour a day.  Control your pain to do that.    CONTROL PAIN Control pain so that you can walk, sleep, tolerate sneezing/coughing, go up/down stairs.  HAVE A BOWEL MOVEMENT DAILY Keep your bowels regular to avoid problems.  OK to try a laxative to override constipation.  OK to use an antidairrheal to slow down diarrhea.  Call if not better after 2 tries  CALL IF YOU HAVE PROBLEMS/CONCERNS Call if you are still struggling despite following these instructions. Call if you have concerns not answered by these instructions     1. DIET: Follow a light bland diet the first 24 hours after arrival home, such as soup, liquids, crackers, etc.  Be sure to include lots of fluids daily.  Avoid fast food or heavy meals as your are more likely to get nauseated.  Eat a low fat the next few days after surgery.   2. Take your usually prescribed home medications unless otherwise directed. 3. PAIN CONTROL: a. Pain is best controlled by a usual combination of three different methods TOGETHER: i. Ice/Heat ii. Over the counter pain medication iii. Prescription pain medication b. Most patients will experience some swelling and bruising around the incisions.  Ice packs or heating pads (30-60 minutes up to 6 times a day) will help. Use ice for the first few days to help decrease swelling and bruising, then switch to heat to help relax tight/sore spots and speed recovery.  Some people prefer to use ice alone, heat alone, alternating between ice & heat.  Experiment to what works for you.  Swelling and bruising can take several weeks to resolve.   c. It is helpful to take an over-the-counter pain medication regularly for the first few weeks.  Choose one of the following that works best for  you: i. Naproxen (Aleve, etc)  Two 220mg  tabs twice a day ii. Ibuprofen (Advil, etc) Three 200mg  tabs four times a day (every meal & bedtime) iii. Acetaminophen (Tylenol, etc) 500-650mg  four times a day (every meal & bedtime) d. A  prescription for pain medication (such as oxycodone, hydrocodone, etc) should be given to you upon discharge.  Take your pain medication as prescribed.  i. If you are having problems/concerns with the prescription medicine (does not control pain, nausea, vomiting, rash, itching, etc), please call us [phone] to see if we need to switch you to a different pain medicine that will work better for you and/or control your side effect better. ii. If you need a refill on your pain medication, please contact your pharmacy.  They will contact our office to request authorization. Prescriptions will not be filled after 5 pm or on week-ends. 4. Avoid getting constipated.  Between the surgery and the pain medications, it is common to experience some constipation.  Increasing fluid intake and taking a fiber supplement (such as Metamucil, Citrucel, FiberCon, MiraLax, etc) 1-2 times a day regularly will usually help prevent this problem from occurring.  A mild laxative (prune juice, Milk of Magnesia, MiraLax, etc) should be taken according to package directions if there are no bowel movements after 48 hours.   5. Watch out for diarrhea.  If you have many loose bowel movements, simplify your diet to bland foods & liquids for a few  days.  Stop any stool softeners and decrease your fiber supplement.  Switching to mild anti-diarrheal medications (Kayopectate, Pepto Bismol) can help.  If this worsens or does not improve, please call us. 6. Wash / shower every day.  You may shower over the dressings as they are waterproof.  Continue to shower over incision(s) after the dressing is off. 7. Remove your waterproof bandages 5 days after surgery.  You may leave the incision open to air.  You may  replace a dressing/Band-Aid to cover the incision for comfort if you wish.  8. ACTIVITIES as tolerated:   a. You may resume regular (light) daily activities beginning the next day--such as daily self-care, walking, climbing stairs--gradually increasing activities as tolerated.  If you can walk 30 minutes without difficulty, it is safe to try more intense activity such as jogging, treadmill, bicycling, low-impact aerobics, swimming, etc. b. Save the most intensive and strenuous activity for last such as sit-ups, heavy lifting, contact sports, etc  Refrain from any heavy lifting or straining until you are off narcotics for pain control.   c. DO NOT PUSH THROUGH PAIN.  Let pain be your guide: If it hurts to do something, don't do it.  Pain is your body warning you to avoid that activity for another week until the pain goes down. d. You may drive when you are no longer taking prescription pain medication, you can comfortably wear a seatbelt, and you can safely maneuver your car and apply brakes. e. Dennis Bast may have sexual intercourse when it is comfortable.  9. FOLLOW UP in our office a. Please call [facility] at [phone] to set up an appointment to see your surgeon in the office for a follow-up appointment approximately 2-3 weeks after your surgery. b. Make sure that you call for this appointment the day you arrive home to insure a convenient appointment time. 10. IF YOU HAVE DISABILITY OR FAMILY LEAVE FORMS, BRING THEM TO THE OFFICE FOR PROCESSING.  DO NOT GIVE THEM TO YOUR DOCTOR.   WHEN TO CALL us [phone]: 1. Poor pain control 2. Reactions / problems with new medications (rash/itching, nausea, etc)  3. Fever over 101.5 F (38.5 C) 4. Inability to urinate 5. Nausea and/or vomiting 6. Worsening swelling or bruising 7. Continued bleeding from incision. 8. Increased pain, redness, or drainage from the incision   The clinic staff is available to answer your questions during regular business hours  (8:30am-5pm).  Please dont hesitate to call and ask to speak to one of our nurses for clinical concerns.   If you have a medical emergency, go to the nearest emergency room or call 911.  A surgeon from [facility] is always on call at the [facility], [address], [place], [place], [place]  [address] ? MAIN: [phone] ? TOLL FREE: [phone] ?  FAX [phone] [email or web address]   Cholecystitis Cholecystitis is inflammation of the gallbladder. It is often called a gallbladder attack. The gallbladder is a pear-shaped organ that lies beneath the liver on the right side of the body. The gallbladder stores bile, which is a fluid that helps the body to digest fats. If bile builds up in your gallbladder, your gallbladder becomes inflamed. This condition may occur suddenly (be acute). Repeat episodes of acute cholecystitis or prolonged episodes may lead to a long-term (chronic) condition. Cholecystitis is serious and it requires treatment. What are the causes? The most common cause of this condition is gallstones.  Gallstones can block the tube (duct) that carries bile out of your gallbladder. This causes bile to build up. Other causes of this condition include:  Damage to the gallbladder due to a decrease in blood flow.  Infections in the bile ducts.  Scars or kinks in the bile ducts.  Tumors in the liver, pancreas, or gallbladder.  What increases the risk? This condition is more likely to develop in:  People who have sickle cell disease.  People who take birth control pills or use estrogen.  People who have alcoholic liver disease.  People who have liver cirrhosis.  People who have their nutrition delivered through a vein (parenteral nutrition).  People who do not eat or drink (do fasting) for a long period of time.  People who are obese.  People who have rapid weight loss.  People who are  pregnant.  People who have increased triglyceride levels.  People who have pancreatitis.  What are the signs or symptoms? Symptoms of this condition include:  Abdominal pain, especially in the upper right area of the abdomen.  Abdominal tenderness or bloating.  Nausea.  Vomiting.  Fever.  Chills.  Yellowing of the skin and the whites of the eyes (jaundice).  How is this diagnosed? This condition is diagnosed with a medical history and physical exam. You may also have other tests, including:  Imaging tests, such as: ? An ultrasound of the gallbladder. ? A CT scan of the abdomen. ? A gallbladder nuclear scan (HIDA scan). This scan allows your health care provider to see the bile moving from your liver to your gallbladder and to your small intestine. ? MRI.  Blood tests, such as: ? A complete blood count, because the white blood cell count may be higher than normal. ? Liver function tests, because some levels may be higher than normal with certain types of gallstones.  How is this treated? Treatment may include:  Fasting for a certain amount of time.  IV fluids.  Medicine to treat pain or vomiting.  Antibiotic medicine.  Surgery to remove your gallbladder (cholecystectomy). This may happen immediately or at a later time.  Follow these instructions at home: Home care will depend on your treatment. In general:  Take over-the-counter and prescription medicines only as told by your health care provider.  If you were prescribed an antibiotic medicine, take it as told by your health care provider. Do not stop taking the antibiotic even if you start to feel better.  Follow instructions from your health care provider about what to eat or drink. When you are allowed to eat, avoid eating or drinking anything that triggers your symptoms.  Keep all follow-up visits as told by your health care provider. This is important.  Contact a health care provider if:  Your pain is  not controlled with medicine.  You have a fever. Get help right away if:  Your pain moves to another part of your abdomen or to your back.  You continue to have symptoms or you develop new symptoms even with treatment. This information is not intended to replace advice given to you by your health care provider. Make sure you discuss any questions you have with your health care provider. Document Released: 05/02/2005 Document Revised: 09/10/2015 Document Reviewed: 08/13/2014 Elsevier Interactive Patient Education  2017 [place] for Chronic Kidney Disease When your kidneys are not working well, they cannot remove waste and excess substances from your blood as effectively as they did before.  This can lead to a buildup and imbalance of these substances, which can worsen kidney damage and affect how your body functions. Certain foods lead to a buildup of these substances in the body. By changing your diet as recommended by your diet and nutrition specialist (dietitian) or health care provider, you could help prevent further kidney damage and delay or prevent the need for dialysis. What are tips for following this plan? General instructions  Work with your health care provider and dietitian to develop a meal plan that is right for you. Foods you can eat, limit, or avoid will be different for each person depending on the stage of kidney disease and any other existing health conditions.  Talk with your health care provider about whether you should take a vitamin and mineral supplement.  Use standard measuring cups and spoons to measure servings of foods. Use a kitchen scale to measure portions of protein foods.  If directed by your health care provider, avoid drinking too much fluid. Measure and count all liquids, including water, ice, soups, flavored gelatin, and frozen desserts such as popsicles or ice cream. Reading food labels  Check the amount of sodium in foods. Choose  foods that have less than 300 milligrams (mg) per serving.  Check the ingredient list for phosphorus or potassium-based additives or preservatives.  Check the amount of saturated and trans fat. Limit or avoid these fats as told by your dietitian. Shopping  Avoid buying foods that are: ? Processed, frozen, or prepackaged. ? Calcium-enriched or fortified.  Do not buy foods that have salt or sodium listed among the first five ingredients.  Do not buy canned vegetables. Cooking  Replace animal proteins, such as meat, fish, eggs, or dairy, with plant proteins from beans, nuts, and soy. ? Use soy milk instead of cow's milk. ? Add beans or tofu to soups, casseroles, or pasta dishes instead of meat.  Soak vegetables, such as potatoes, before cooking to reduce potassium. To do this: ? Peel and cut into small pieces. ? Soak in warm water for at least 2 hours. For every 1 cup of vegetables, use 10 cups of water. ? Drain and rinse with warm water. ? Boil for at least 5 minutes. Meal planning  Limit the amount of protein from plant and animal sources you eat each day.  Do not add salt to food when cooking or before eating.  Eat meals and snacks at around the same time each day. If you have diabetes:  If you have diabetes (diabetes mellitus) and chronic kidney disease, it is important to keep your blood glucose in the target range recommended by your health care provider. Follow your diabetes management plan. This may include: ? Checking your blood glucose regularly. ? Taking oral medicines, insulin, or both. ? Exercising for at least 30 minutes on 5 or more days each week, or as told by your health care provider. ? Tracking how many servings of carbohydrates you eat at each meal.  You may be given specific guidelines on how much of certain foods and nutrients you may eat, depending on your stage of kidney disease and whether you have high blood pressure (hypertension). Follow your meal  plan as told by your dietitian. What nutrients should be limited? The items listed are not a complete list. Talk with your dietitian about what dietary choices are best for you. Potassium Potassium affects how steadily your heart beats. If too much potassium builds up in your blood, it can cause an  irregular heartbeat or even a heart attack. You may need to eat less potassium, depending on your blood potassium levels and the stage of kidney disease. Talk to your dietitian about how much potassium you may have each day. You may need to limit or avoid foods that are high in potassium, such as:  Milk and soy milk.  Fruits, such as bananas, papaya, apricots, nectarines, melon, prunes, raisins, kiwi, and oranges.  Vegetables, such as potatoes, sweet potatoes, yams, tomatoes, leafy greens, beets, okra, avocado, pumpkin, and winter squash.  White and lima beans.  Phosphorus Phosphorus is a mineral found in your bones. A balance between calcium and phosphorous is needed to build and maintain healthy bones. Too much phosphorus pulls calcium from your bones. This can make your bones weak and more likely to break. Too much phosphorus can also make your skin itch. You may need to eat less phosphorus depending on your blood phosphorus levels and the stage of kidney disease. Talk to your dietitian about how much potassium you may have each day. You may need to take medicine to lower your blood phosphorus levels if diet changes do not help. You may need to limit or avoid foods that are high in phosphorus, such as:  Milk and dairy products.  Dried beans and peas.  Tofu, soy milk, and other soy-based meat replacements.  Colas.  Nuts and peanut butter.  Meat, poultry, and fish.  Bran cereals and oatmeals.  Protein Protein helps you to make and keep muscle. It also helps in the repair of your bodys cells and tissues. One of the natural breakdown products of protein is a waste product called urea.  When your kidneys are not working properly, they cannot remove wastes, such as urea, like they did before you developed chronic kidney disease. Reducing how much protein you eat can help prevent a buildup of urea in your blood. Depending on your stage of kidney disease, you may need to limit foods that are high in protein. Sources of animal protein include:  Meat (all types).  Fish and seafood.  Poultry.  Eggs.  Dairy.  Other protein foods include:  Beans and legumes.  Nuts and nut butter.  Soy and tofu.  Sodium Sodium, which is found in salt, helps maintain a healthy balance of fluids in your body. Too much sodium can increase your blood pressure and have a negative effect on the function of your heart and lungs. Too much sodium can also cause your body to retain too much fluid, making your kidneys work harder. Most people should have less than 2,300 milligrams (mg) of sodium each day. If you have hypertension, you may need to limit your sodium to 1,500 mg each day. Talk to your dietitian about how much sodium you may have each day. You may need to limit or avoid foods that are high in sodium, such as:  Salt seasonings.  Soy sauce.  Cured and processed meats.  Salted crackers and snack foods.  Fast food.  Canned soups and most canned foods.  Pickled foods.  Vegetable juice.  Boxed mixes or ready-to-eat boxed meals and side dishes.  Bottled dressings, sauces, and marinades.  Summary  Chronic kidney disease can lead to a buildup and imbalance of waste and excess substances in the body. Certain foods lead to a buildup of these substances. By adjusting your intake of these foods, you could help prevent more kidney damage and delay or prevent the need for dialysis.  Food adjustments are different  for each person with chronic kidney disease. Work with a dietitian to set up nutrient goals and a meal plan that is right for you.  If you have diabetes and chronic kidney  disease, it is important to keep your blood glucose in the target range recommended by your health care provider. This information is not intended to replace advice given to you by your health care provider. Make sure you discuss any questions you have with your health care provider. Document Released: 07/23/2002 Document Revised: 04/27/2016 Document Reviewed: 04/27/2016 Elsevier Interactive Patient Education  2017 [employer].

## 2017-03-07 NOTE — Discharge Summary (Signed)
[facility]  Physician Discharge Summary   Patient ID: David [name] MRN: [id number] DOB/AGE: February 02, 1941 76 y.o.  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 Follow-up with primary care provider in 1 week.  He will need blood work to check his renal function and potassium levels.  2. If K level continues to remain high, will need to consider stopping his ACEI.     DISCHARGE CONDITION: fair  Diet recommendation: Modified carbohydrate/renal  Filed Weights   03/04/17 0904  Weight: 131.5 kg (290 lb)    INITIAL HISTORY: 76 year old Caucasian male with a past medical history of hypertrophic cardiomyopathy, chronic kidney disease stage III, history of insulin-dependent diabetes mellitus on insulin pump, who was in his usual state of health until 1 day prior to admission when he developed abdominal discomfort which got worse through the night.  Presented to the emergency department and was found to have cholelithiasis with suspicion for cholecystitis.  Patient was hospitalized for further management.  Patient subsequently underwent laparoscopic cholecystectomy.  Consultants: General surgery.  Cardiology.  Procedures: Laparoscopic cholecystectomy 10/21   HOSPITAL COURSE:   Cholelithiasis with concern for acute cholecystitis Patient's LFTs were normal.  Ultrasound and CT scan suggested cholelithiasis with possible cholecystitis.  Patient was seen by  general surgery.  Seen by cardiology.  He underwent laparoscopic cholecystectomy.  No stones in the bile duct on cholangiogram.    Patient has remained medically stable.  Diet was advanced.  Pain is well controlled.  He has ambulated.  Okay for discharge home.    History of hypertrophic cardiomyopathy with history of sinus node dysfunction status post pacemaker Appears to be stable.  Seen by cardiology prior to surgery.  Currently stable.  History of nonobstructive CAD Last cardiac catheterization was in 2012 as per cardiology notes.  Does not have any history of PCI/angioplasty.  He is on aspirin beta blocker nitrates which will be continued.    History of chronic kidney disease stage III/hyperkalemia Renal function is close to baseline.  Potassium levels were noted to be elevated.  He had to be given couple doses of Kayexalate.  He is noted to be on ACEI. He was educated about renal diet.  He would benefit from repeat blood work in 1 week. If K level continues to remain high, will need to consider stopping his ACEI.  It is 4.4 today.  History of insulin-dependent diabetes mellitus on insulin pump He is followed by endocrinology.  HbA1c was 8.2 on October 5.  Patient was taken off of his pump due to n.p.o. status.  Was placed on Lantus.    After surgery he was placed back on insulin pump.  He did experience an episode of low glucose level this morning which quickly corrected.  Outpatient follow-up with endocrinology.    History of obstructive sleep apnea Continue CPAP.  Normocytic anemia likely due to anemia of chronic kidney disease Drop in hemoglobin is noted.  Possibly due to dilution and operative loss.  No  evidence for overt bleeding otherwise.  C  Morbid obesity Body mass index is 35.3 kg/m.  Overall stable.  Okay for discharge home today.    PERTINENT LABS:  The results of significant diagnostics from this hospitalization (including imaging, microbiology, ancillary and  laboratory) are listed below for reference.    Microbiology: Recent Results (from the past 240 hour(s))  Surgical PCR screen     Status: None   Collection Time: 03/05/17 10:45 AM  Result Value Ref Range Status   MRSA, PCR NEGATIVE NEGATIVE Final   Staphylococcus aureus NEGATIVE NEGATIVE Final    Comment: (NOTE) The Xpert SA Assay (FDA approved for NASAL specimens in patients 19 years of age and older), is one component of a comprehensive surveillance program. It is not intended to diagnose infection nor to guide or monitor treatment.      Labs: Basic Metabolic Panel:  Recent Labs Lab 03/04/17 1918 03/05/17 0503 03/05/17 0925 03/06/17 0401 03/06/17 1338 03/07/17 0421  NA 140 139  --  137 135 139  K 4.7 5.9* 5.8* 5.7* 5.0 4.4  CL 107 109  --  107 103 103  CO2 25 25  --  25 23 27   GLUCOSE 80 161*  --  220* 249* 99  BUN 40* 40*  --  42* 41* 40*  CREATININE 2.19* 2.24*  --  2.30* 2.30* 2.36*  CALCIUM 9.6 8.9  --  8.7* 8.6* 8.7*   Liver Function Tests:  Recent Labs Lab 03/04/17 0922 03/05/17 0503 03/06/17 0401 03/07/17 0421  AST 21 16 34 23  ALT 28 22 50 39  ALKPHOS 96 82 85 83  BILITOT 0.5 0.5 0.7 0.7  PROT 7.3 6.1* 6.1* 6.3*  ALBUMIN 3.9 3.2* 3.1* 3.2*    Recent Labs Lab 03/04/17 0922  LIPASE 29   CBC:  Recent Labs Lab 03/04/17 0922 03/05/17 0503 03/06/17 0401 03/07/17 0421  WBC 8.7 8.3 8.0 6.8  NEUTROABS 7.0  --   --   --   HGB 11.6* 10.0* 9.3* 9.6*  HCT 34.8* 30.4* 28.3* 29.3*  MCV 93.8 93.8 93.7 93.3  PLT 169 159 150 150    CBG:  Recent Labs Lab 03/06/17 1653 03/06/17 2223 03/07/17 0530 03/07/17 0545 03/07/17 0720  GLUCAP 164* 173* 59* 68 132*     IMAGING STUDIES Ct Abdomen Pelvis Wo Contrast  Result Date: 03/04/2017 CLINICAL DATA:  Upper abdominal pain with nausea for several days EXAM: CT ABDOMEN AND PELVIS WITHOUT CONTRAST TECHNIQUE: Multidetector CT imaging of the abdomen and pelvis was performed following the standard  protocol without IV contrast. COMPARISON:  07/23/2015 FINDINGS: Lower chest: No acute abnormality. Hepatobiliary: The liver is within normal limits. The gallbladder is well distended with some dependent density consistent with small stones or gallbladder sludge. Some suggestion of gallbladder wall thickening is noted although incompletely evaluated on this exam. Ultrasound may be helpful for further evaluation. Pancreas: Stable calcifications in the pancreatic head are noted. No pancreatic mass is seen. No ductal dilatation is noted. Spleen: Normal in size without focal abnormality. Adrenals/Urinary Tract: The adrenal glands are within normal limits. Cystic changes are noted bilaterally. No renal calculi or obstructive changes are seen. The bladder is well distended. Stomach/Bowel: Scattered diverticular change of the colon is noted without evidence of diverticulitis. Appendix is within normal limits without inflammatory changes. Vascular/Lymphatic: Aortic atherosclerosis. No enlarged abdominal or pelvic lymph nodes. Reproductive: Prostate is unremarkable. Other: No abdominal wall hernia or abnormality. No abdominopelvic ascites. Musculoskeletal: Degenerative changes of lumbar spine  are noted. No acute abnormality is seen. IMPRESSION: Well distended gallbladder with some suggestion of gallbladder wall thickening. Layering density likely related to small stones or sludge is noted as well. Ultrasound may be helpful for further evaluation. Chronic changes without acute abnormality. Electronically Signed   By: Inez Catalina [name]   On: 03/04/2017 11:29   Dg Cholangiogram Operative  Result Date: 03/05/2017 CLINICAL DATA:  Cholecystitis EXAM: INTRAOPERATIVE CHOLANGIOGRAM TECHNIQUE: Cholangiographic images from the C-arm fluoroscopic device were submitted for interpretation post-operatively. Please see the procedural report for the amount of contrast and the fluoroscopy time utilized. COMPARISON:  03/04/2017 FINDINGS:  Intraoperative cholangiogram performed during the laparoscopic procedure. There is leakage at the injection catheter site along the cystic duct. Residual cystic duct, biliary confluence, common hepatic duct, and common bile duct are patent. Contrast does drain into the duodenum. IMPRESSION: Patent biliary system. Electronically Signed   By: Jerilynn Mages.  Shick [name]   On: 03/05/2017 15:03   US Abdomen Limited Ruq  Result Date: 03/04/2017 CLINICAL DATA:  Acute abdominal pain and nausea for 2 days. Possible gallbladder wall thickening on CT performed today. EXAM: ULTRASOUND ABDOMEN LIMITED RIGHT UPPER QUADRANT COMPARISON:  03/04/2017 CT FINDINGS: Gallbladder: Gallbladder wall thickening is identified proximally measuring up to 5 mm. Cholelithiasis noted with the largest calculus measuring 9 mm. No pericholecystic fluid identified. No definite sonographic Murphy sign but the patient is on pain medication. Common bile duct: Diameter: 6 mm.  No intrahepatic or extrahepatic biliary dilatation. Liver: No focal lesion identified. Within normal limits in parenchymal echogenicity. Portal vein is patent on color Doppler imaging with normal direction of blood flow towards the liver. IMPRESSION: Proximal gallbladder wall thickening with cholelithiasis, suspicious for acute cholecystitis. No biliary dilatation. Electronically Signed   By: Margarette Canada [name]   On: 03/04/2017 12:41    DISCHARGE EXAMINATION: Vitals:   03/06/17 1130 03/06/17 1358 03/06/17 2015 03/07/17 0945  BP:  (!) 139/39 (!) 168/51 (!) 188/48  Pulse:  62 62   Resp:  20 16   Temp: 98.1 F (36.7 C) 98.2 F (36.8 C) 97.9 F (36.6 C)   TempSrc: Oral Oral Oral   SpO2:  94% 97%   Weight:      Height:       General appearance: alert, cooperative, appears stated age and no distress Resp: clear to auscultation bilaterally Cardio: regular rate and rhythm, S1, S2 normal, no murmur, click, rub or gallop GI: soft, non-tender; bowel sounds normal; no masses,  no  organomegaly  DISPOSITION: Home  Discharge Instructions    Call MD for:    Complete by:  As directed    FEVER > 101.5 F  (temperatures < 101.5 F are not significant)   Call MD for:  extreme fatigue    Complete by:  As directed    Call MD for:  persistant dizziness or light-headedness    Complete by:  As directed    Call MD for:  persistant nausea and vomiting    Complete by:  As directed    Call MD for:  redness, tenderness, or signs of infection (pain, swelling, redness, odor or green/yellow discharge around incision site)    Complete by:  As directed    Call MD for:  severe uncontrolled pain    Complete by:  As directed    Diet - low sodium heart healthy    Complete by:  As directed    Follow a light diet the first few days at home.   Start  with a bland diet such as soups, liquids, starchy foods, low fat foods, etc.   If you feel full, bloated, or constipated, stay on a full liquid or pureed/blenderized diet for a few days until you feel better and no longer constipated. Be sure to drink plenty of fluids every day to avoid getting dehydrated (feeling dizzy, not urinating, etc.). Gradually add a fiber supplement to your diet   Discharge instructions    Complete by:  As directed    See Discharge Instructions If you are not getting better after two weeks or are noticing you are getting worse, contact our office [phone] for further advice.  We may need to adjust your medications, re-evaluate you in the office, send you to the emergency room, or see what other things we can do to help. The clinic staff is available to answer your questions during regular business hours (8:30am-5pm).  Please don't hesitate to call and ask to speak to one of our nurses for clinical concerns.    A surgeon from [facility] is always on call at the hospitals 24 hours/day If you have a medical emergency, go to the nearest emergency room or call 911.   Driving Restrictions    Complete by:   As directed    You may drive when you are no longer taking narcotic prescription pain medication, you can comfortably wear a seatbelt, and you can safely make sudden turns/stops to protect yourself without hesitating due to pain.   Increase activity slowly    Complete by:  As directed    Start light daily activities --- self-care, walking, climbing stairs- beginning the day after surgery.  Gradually increase activities as tolerated.  Control your pain to be active.  Stop when you are tired.  Ideally, walk several times a day, eventually an hour a day.   Most people are back to most day-to-day activities in a few weeks.  It takes 4-8 weeks to get back to unrestricted, intense activity. If you can walk 30 minutes without difficulty, it is safe to try more intense activity such as jogging, treadmill, bicycling, low-impact aerobics, swimming, etc. Save the most intensive and strenuous activity for last (Usually 4-8 weeks after surgery) such as sit-ups, heavy lifting, contact sports, etc.  Refrain from any intense heavy lifting or straining until you are off narcotics for pain control.  You will have off days, but things should improve week-by-week. DO NOT PUSH THROUGH PAIN.  Let pain be your guide: If it hurts to do something, don't do it.  Pain is your body warning you to avoid that activity for another week until the pain goes down.   Lifting restrictions    Complete by:  As directed    If you can walk 30 minutes without difficulty, it is safe to try more intense activity such as jogging, treadmill, bicycling, low-impact aerobics, swimming, etc. Save the most intensive and strenuous activity for last (Usually 4-8 weeks after surgery) such as sit-ups, heavy lifting, contact sports, etc.  Refrain from any intense heavy lifting or straining until you are off narcotics for pain control.  You will have off days, but things should improve week-by-week. DO NOT PUSH THROUGH PAIN.  Let pain be your guide: If it  hurts to do something, don't do it.  Pain is your body warning you to avoid that activity for another week until the pain goes down.   May walk up steps    Complete by:  As directed  No wound care    Complete by:  As directed    It is good for closed incision and even open wounds to be washed every day.  Shower every day.  Short baths are fine.  Wash the incisions and wounds clean with soap & water.    If you have a closed incision(s), wash the incision with soap & water every day.  You may leave closed incisions open to air if it is dry.   You may cover the incision with clean gauze & replace it after your daily shower for comfort. If you have skin tapes (Steristrips) or skin glue (Dermabond) on your incision, leave them in place.  They will fall off on their own like a scab.  You may trim any edges that curl up with clean scissors.  If you have staples, set up an appointment for them to be removed in the office in 10 days after surgery.  If you have a drain, wash around the skin exit site with soap & water and place a new dressing of gauze or band aid around the skin every day.  Keep the drain site clean & dry.   Sexual Activity Restrictions    Complete by:  As directed    You may have sexual intercourse when it is comfortable. If it hurts to do something, stop.      ALLERGIES: No Known Allergies   Current Discharge Medication List    START taking these medications   Details  traMADol (ULTRAM) 50 MG tablet Take 1-2 tablets (50-100 mg total) by mouth every 6 (six) hours as needed for moderate pain or severe pain. Qty: 30 tablet, Refills: 0      CONTINUE these medications which have NOT CHANGED   Details  ACCU-CHEK SOFTCLIX LANCETS lancets USE AS INSTRUCTED TO CHECK BLOOD SUGAR 6 TIMES PER DAY Dx: Code E11.9 Qty: 300 each, Refills: 2    acetaminophen-codeine (TYLENOL #3) 300-30 MG tablet Take 1 tablet by mouth daily as needed.    aspirin EC 81 MG tablet Take 81 mg by mouth daily.      calcitRIOL (ROCALTROL) 0.25 MCG capsule Take 0.25 mcg by mouth daily.     carvedilol (COREG) 25 MG tablet TAKE ONE TABLET BY MOUTH TWICE DAILY WITH A MEAL Qty: 90 tablet, Refills: 1    cholecalciferol (VITAMIN D) 1000 units tablet Take 1 tablet (1,000 Units total) by mouth daily. Qty: 30 tablet, Refills: 1    doxazosin (CARDURA) 4 MG tablet TAKE ONE TABLET BY MOUTH ONCE DAILY Qty: 90 tablet, Refills: 0    glucose blood (BAYER CONTOUR NEXT TEST) test strip Use to test blood sugar 4 times daily    Dx code is E11.49 Qty: 150 each, Refills: 3    hydroxypropyl methylcellulose / hypromellose (ISOPTO TEARS / GONIOVISC) 2.5 % ophthalmic solution Place 1 drop into both eyes as needed for dry eyes.    Icosapent Ethyl (VASCEPA) 1 g CAPS 2 caps bid Qty: 120 capsule, Refills: 2    insulin regular human CONCENTRATED (HUMULIN R) 500 UNIT/ML injection USE 0.25 ML DAILY OR AS DIRECTED IN INSULIN PUMP. Dx code E11.65 Qty: 20 mL, Refills: 3    isosorbide mononitrate (IMDUR) 30 MG 24 hr tablet TAKE TWO TABLETS BY MOUTH ONCE DAILY Qty: 60 tablet, Refills: 11    liraglutide (VICTOZA) 18 MG/3ML SOPN INJECT 0.3MLS (1.8 MG TOTAL) INTO THE SKIN DAILY Qty: 9 pen, Refills: 1    lisinopril (PRINIVIL,ZESTRIL) 5 MG tablet Take 5  mg by mouth every evening.     Omega-3 Fatty Acids (FISH OIL) 1000 MG CAPS Take by mouth 2 (two) times daily.    pantoprazole (PROTONIX) 40 MG tablet TAKE ONE TABLET BY MOUTH ONCE DAILY Qty: 90 tablet, Refills: 1    REGRANEX 0.01 % gel     sertraline (ZOLOFT) 50 MG tablet TAKE ONE TABLET BY MOUTH ONCE DAILY Qty: 90 tablet, Refills: 2    torsemide (DEMADEX) 20 MG tablet Take 20 mg by mouth every evening.          Follow-up [facility], [place]. Go on 03/21/2017.   Specialty:  General Surgery Why:  Your appointment is at 10 AM, be at the office 30 minutes early for check in.  Bring photo ID and insurance information.   Contact information: [address] [place] [phone]       [name], MD. Schedule an appointment as soon as possible for a visit in 1 week(s).   Specialty:  Family Medicine Why:  for blood work to check potassium level Contact information: [place] [address] [phone]           TOTAL DISCHARGE TIME: 35 mins  [facility] Pager [phone]  03/07/2017, 11:35 AM

## 2017-03-07 NOTE — Telephone Encounter (Signed)
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 Follow-up with primary care provider in 1 week.  He will need blood work to check his renal function and potassium levels.  2. If K level continues to remain high, will need to consider stopping his ACEI.     DISCHARGE CONDITION: fair  Diet recommendation: Modified carbohydrate/renal _______________________________________________________________________________ Transition Care Management Follow-up Telephone Call   Date discharged? 03/07/17    How have you been since you were released from the hospital? "alright"   Do you understand why you were in the hospital? yes   Do you understand the discharge instructions? yes   Where were you discharged to? Home   Items Reviewed:  Medications reviewed: yes  Allergies reviewed: yes  Dietary changes reviewed: yes  Referrals reviewed: yes   Functional Questionnaire:   Activities of Daily Living (ADLs):   He states they are independent in the following: ambulation, bathing and hygiene, feeding, continence, grooming, toileting and dressing States they require assistance with the following: None   Any transportation issues/concerns?: no   Any patient concerns? Yes. Pts wife states that pts BP was running high in the hospital. I asked her to check his BP once in the morning and once and night and if it continues to run high then  she can send a mychart message for Dr [name] to review.    Confirmed importance and date/time of follow-up visits scheduled yes  Provider Appointment booked with Dr [name] 03/14/17  Confirmed with patient if condition begins to worsen call PCP or go to the ER.  Patient was given the office number and encouraged to call back with question or concerns.  : yes

## 2017-03-09 ENCOUNTER — Encounter (HOSPITAL_COMMUNITY): Payer: Self-pay | Admitting: Surgery

## 2017-03-14 ENCOUNTER — Ambulatory Visit (INDEPENDENT_AMBULATORY_CARE_PROVIDER_SITE_OTHER): Payer: Medicare Other | Admitting: Family Medicine

## 2017-03-14 ENCOUNTER — Encounter: Payer: Self-pay | Admitting: Family Medicine

## 2017-03-14 VITALS — BP 136/76 | HR 77 | Temp 98.7°F | Wt 289.2 lb

## 2017-03-14 DIAGNOSIS — N183 Chronic kidney disease, stage 3 (moderate): Secondary | ICD-10-CM

## 2017-03-14 DIAGNOSIS — I10 Essential (primary) hypertension: Secondary | ICD-10-CM | POA: Diagnosis not present

## 2017-03-14 DIAGNOSIS — Z9049 Acquired absence of other specified parts of digestive tract: Secondary | ICD-10-CM

## 2017-03-14 DIAGNOSIS — E1122 Type 2 diabetes mellitus with diabetic chronic kidney disease: Secondary | ICD-10-CM | POA: Diagnosis not present

## 2017-03-14 DIAGNOSIS — E1159 Type 2 diabetes mellitus with other circulatory complications: Secondary | ICD-10-CM

## 2017-03-14 DIAGNOSIS — I152 Hypertension secondary to endocrine disorders: Secondary | ICD-10-CM

## 2017-03-14 DIAGNOSIS — L249 Irritant contact dermatitis, unspecified cause: Secondary | ICD-10-CM | POA: Diagnosis not present

## 2017-03-14 LAB — BASIC METABOLIC PANEL
BUN: 50 mg/dL — ABNORMAL HIGH (ref 6–23)
CO2: 30 mEq/L (ref 19–32)
Calcium: 9.6 mg/dL (ref 8.4–10.5)
Chloride: 103 mEq/L (ref 96–112)
Creatinine, Ser: 2.09 mg/dL — ABNORMAL HIGH (ref 0.40–1.50)
GFR: 32.93 mL/min — ABNORMAL LOW (ref >60.00)
Glucose, Bld: 106 mg/dL — ABNORMAL HIGH (ref 70–99)
Potassium: 4.8 mEq/L (ref 3.5–5.1)
Sodium: 140 mEq/L (ref 135–145)

## 2017-03-14 NOTE — Assessment & Plan Note (Signed)
At goal. Continue current regimen. 

## 2017-03-14 NOTE — Patient Instructions (Signed)
We will check blood work today.  I am glad you are doing well.  Please come back to see me in 5-6 months, or sooner as needed.  Take care,  Dr [name]

## 2017-03-14 NOTE — Assessment & Plan Note (Signed)
Check BMET today 

## 2017-03-14 NOTE — Progress Notes (Signed)
Subjective:  David [name] is a 76 y.o. male who presents today for TCM visit.  HPI:  Hospital follow up for cholecystitis Summary of Hospital admission: Reason for admission: Cholecystitis Date of admission: 03/04/2017 Date of discharge: 03/07/2017 Date of Interactive contact: 03/07/2017 Summary of Hospital course: Patient presented to ED with abdominal pain.  Underwent ultrasound and CT scan that showed cholelithiasis with possible cholecystitis.  Was evaluated by surgery and underwent cholecystectomy.  He did well postoperatively without complications.  Interim History: He has done well since his surgery.  No abdominal pain.  No nausea or vomiting.  No diarrhea.  He will be seeing his surgeon next week.  CKD/hyperkalemia Patient was noted to have elevated potassium levels while admitted.  His level was 4.4 at discharge.  Hypertension, Chronic Problem, Stable BP Readings from Last 3 Encounters:  03/14/17 136/76  03/07/17 (!) 188/48  02/22/17 (!) 142/76   Current Medications: Coreg 25 mg twice daily, Cardura 4 mg daily, Imdur 30 mg daily, lisinopril 5 mg daily, torsemide 20 mg daily, compliant without side effects. Interim History: Patient noted to have elevated blood pressures while admitted.  Patient and wife report they have normalized since being discharged.  ROS: Denies any chest pain, shortness of breath,   Skin Lesion, acute issue Patient also with rash on areas where he had adhesive tape placed from his hospitalization.  Reports that these areas had blistered up and then popped.  He has been keeping the area dry.  They are now scabbed over.  No pain.  No drainage.  ROS: Per HPI  PMH:  The following were reviewed and entered/updated in epic: Past Medical History:  Diagnosis Date  . Anemia, iron deficiency   . Anxiety   . Arthritis   . BPH (benign prostatic hypertrophy)   . CAD (coronary artery disease)    Nonobstructive CAD per cath  . Cardiac pacemaker  in situ   . CHF (congestive heart failure) ([place])   . Chronic ulcer of right foot ([place])   . CKD (chronic kidney disease), stage III ([place]) secondary to DM and HTN   nephrologist-  [name]  . Dyspnea   . History of cellulitis    right great toe 10-25-2014  . History of skin cancer   . HOCM (hypertrophic obstructive cardiomyopathy) ([place])   . Hypertension   . 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 Insulin pump in place   . OSA on CPAP   . Peripheral neuropathy    severe  . Peripheral vascular disease ([place])    bilateral lower extremities  . Rib fracture 07/24/2015  . Secondary hyperparathyroidism of renal origin ([place])   . Sinus node dysfunction [facility])    Patient Active Problem List   Diagnosis Date Noted  . Acute cholecystitis s/p lap cholecystectomy 03/05/2017 03/04/2017  . HOCM (hypertrophic obstructive cardiomyopathy) ([place]) 03/04/2017  . 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 Fatigue 01/27/2017  . Low vitamin B12 level 01/27/2017  . Chronic ulcer of right foot ([place]) 08/09/2016  . Syncope 07/24/2015  . 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 OSA on CPAP 10/26/2014  . Back pain 10/16/2014  . Left leg numbness 10/16/2014  . Episodic lightheadedness 08/04/2014  . Open toe wound 07/18/2014  . Diabetes mellitus with neurological manifestations, uncontrolled ([place]) 02/27/2014  . Seasonal and perennial allergic rhinitis 10/25/2013  . Hypertension  associated with diabetes ([place]) 10/25/2013  . Hyperlipidemia associated with type 2 diabetes mellitus ([place]) 10/25/2013  . Morbid obesity ([place]) 10/25/2013  . CKD stage 3 due to type 2 diabetes mellitus ([place]) 10/25/2013   Past Surgical History:  Procedure Laterality Date  . 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 AMPUTATION TOE Right 03/12/2015   Procedure: RIGHT HALLUS AMPUTATION ;   Surgeon: [name], MD;  Location: [facility];  Service: Podiatry;  Laterality: Right;  . CARDIAC CATHETERIZATION  11-25-2010   [place], [place]   Nonobstructive CAD  . CARDIAC PACEMAKER PLACEMENT  Nov 2009   [employer]  . CHOLECYSTECTOMY N/A 03/05/2017   Procedure: LAPAROSCOPIC CHOLECYSTECTOMY WITH INTRAOPERATIVE CHOLANGIOGRAM;  Surgeon: [name], MD;  Location: [facility];  Service: General;  Laterality: N/A;  . EP IMPLANTABLE DEVICE N/A 06/03/2015   Procedure: PPM Generator Changeout;  Surgeon: [name], MD;  Location: [facility];  Service: Cardiovascular;  Laterality: N/A;  . EXCISION BONE CYST Right 03/06/2015   Procedure: BONE BIOPSIES OF RIGHT FOOT;  Surgeon: [name], MD;  Location: [facility];  Service: Podiatry;  Laterality: Right;  . ORIF ANKLE FRACTURE Left 11/06/2014   Procedure: OPEN REDUCTION INTERNAL FIXATION (ORIF) LEFT  ANKLE FRACTURE;  Surgeon: [name], MD;  Location: [facility];  Service: Orthopedics;  Laterality: Left;  . TOTAL KNEE ARTHROPLASTY    . VEIN LIGATION AND STRIPPING      Family History  Problem Relation Age of Onset  . Cancer Mother        breast  . Heart attack Father     Medications- reviewed and updated Current Outpatient Prescriptions  Medication Sig Dispense Refill  . ACCU-CHEK SOFTCLIX LANCETS lancets USE AS INSTRUCTED TO CHECK BLOOD SUGAR 6 TIMES PER DAY Dx: Code E11.9 300 each 2  . acetaminophen-codeine (TYLENOL #3) 300-30 MG tablet Take 1 tablet by mouth daily as needed.    Marland Kitchen aspirin EC 81 MG tablet Take 81 mg by mouth daily.    . calcitRIOL (ROCALTROL) 0.25 MCG capsule Take 0.25 mcg by mouth daily.     . carvedilol (COREG) 25 MG tablet TAKE ONE TABLET BY MOUTH TWICE DAILY WITH A MEAL 90 tablet 1  . cholecalciferol (VITAMIN D) 1000 units tablet Take 1 tablet (1,000 Units total) by mouth daily. (Patient taking differently: Take 1,000 Units by mouth every evening. ) 30 tablet 1    . doxazosin (CARDURA) 4 MG tablet TAKE ONE TABLET BY MOUTH ONCE DAILY 90 tablet 0  . glucose blood (BAYER CONTOUR NEXT TEST) test strip Use to test blood sugar 4 times daily    Dx code is E11.49 150 each 3  . hydroxypropyl methylcellulose / hypromellose (ISOPTO TEARS / GONIOVISC) 2.5 % ophthalmic solution Place 1 drop into both eyes as needed for dry eyes.    Vanessa Kick Ethyl (VASCEPA) 1 g CAPS 2 caps bid 120 capsule 2  . insulin regular human CONCENTRATED (HUMULIN R) 500 UNIT/ML injection USE 0.25 ML DAILY OR AS DIRECTED IN INSULIN PUMP. Dx code E11.65 20 mL 3  . isosorbide mononitrate (IMDUR) 30 MG 24 hr tablet TAKE TWO TABLETS BY MOUTH ONCE DAILY 60 tablet 11  . liraglutide (VICTOZA) 18 MG/3ML SOPN INJECT 0.3MLS (1.8 MG TOTAL) INTO THE SKIN DAILY 9 pen 1  . lisinopril (PRINIVIL,ZESTRIL) 5 MG tablet Take 5 mg by mouth every evening.     . Omega-3 Fatty Acids (  FISH OIL) 1000 MG CAPS Take by mouth 2 (two) times daily.    . pantoprazole (PROTONIX) 40 MG tablet TAKE ONE TABLET BY MOUTH ONCE DAILY 90 tablet 1  . REGRANEX 0.01 % gel     . sertraline (ZOLOFT) 50 MG tablet TAKE ONE TABLET BY MOUTH ONCE DAILY 90 tablet 2  . torsemide (DEMADEX) 20 MG tablet Take 20 mg by mouth every evening.     . traMADol (ULTRAM) 50 MG tablet Take 1-2 tablets (50-100 mg total) by mouth every 6 (six) hours as needed for moderate pain or severe pain. 30 tablet 0   No current facility-administered medications for this visit.     Allergies-reviewed and updated Allergies  Allergen Reactions  . Adhesive [Tape]     blisters    Social History   Social History  . Marital status: Married    Spouse name: N/A  . Number of children: 3  . Years of education: 12   Occupational History  . Retired    Social History Main Topics  . Smoking status: Former Smoker    Packs/day: 2.00    Years: 30.00    Quit date: 03/03/1984  . Smokeless tobacco: Former Systems developer  . Alcohol use 0.6 oz/week    1 Glasses of wine per week      Comment: social  . Drug use: No  . Sexual activity: Not Currently   Other Topics Concern  . None   Social History Narrative   Currently resides with his wife. 1 dog. Fun/Hobby: Golf    Denies any religious beliefs effecting health care.    Objective:  Physical Exam: BP 136/76   Pulse 77   Temp 98.7 F (37.1 C) (Oral)   Wt 289 lb 4 oz (131.2 kg)   SpO2 95%   BMI 35.21 kg/m   Gen: NAD, resting comfortably CV: RRR with no murmurs appreciated Pulm: NWOB, CTAB with no crackles, wheezes, or rhonchi GI: Morbidly obese.  Insulin pump in place.  Surgical scars from laparoscopic cholecystectomy well-healing.  Several small erythematous areas of granulation tissue present on abdomen.  No signs of cutaneous infection. MSK: No edema, cyanosis, or clubbing noted Skin: Warm, dry Neuro: Grossly normal, moves all extremities Psych: Normal affect and thought content  Assessment/Plan:  Cholecystitis Seems to be recovering normally without complication.  Advised patient to avoid fatty foods and start fiber supplement to help prevent diarrhea.  Return precautions reviewed.  Will be following up with surgery.  Rash Contact dermatitis related to tape adhesive.  Seems to be healing normally without any signs of infection.  Continue conservative care.  Advised patient and wife to keep area clean and dry.  Return precautions reviewed.  Follow-up as needed.  CKD stage 3 due to type 2 diabetes mellitus ([place]) Check BMET today.  Hypertension associated with diabetes ([place]) At goal.  Continue current regimen.  [name], MD 03/14/2017 2:21 PM

## 2017-03-17 ENCOUNTER — Other Ambulatory Visit: Payer: Self-pay | Admitting: Endocrinology

## 2017-03-22 ENCOUNTER — Ambulatory Visit (INDEPENDENT_AMBULATORY_CARE_PROVIDER_SITE_OTHER): Payer: Medicare Other | Admitting: Family Medicine

## 2017-03-22 ENCOUNTER — Encounter: Payer: Self-pay | Admitting: Family Medicine

## 2017-03-22 DIAGNOSIS — F419 Anxiety disorder, unspecified: Secondary | ICD-10-CM | POA: Diagnosis not present

## 2017-03-22 MED ORDER — HYDROXYZINE HCL 50 MG PO TABS: 50.0000 mg | ORAL_TABLET | Freq: Three times a day (TID) | ORAL | 1 refills | Status: DC | PRN

## 2017-03-22 MED ORDER — SERTRALINE HCL 50 MG PO TABS: 100.0000 mg | ORAL_TABLET | Freq: Every day | ORAL | 3 refills | Status: DC

## 2017-03-22 NOTE — Progress Notes (Signed)
   Subjective:  David [name] is a 76 y.o. male who presents today with a chief complaint of anxiety.   HPI:  Anxiety, chronic problem, new to this provider Several year history.  Worsened over the last week.  He has been on sertraline 25 mg daily for several years and has done well with this.  2 days ago he had symptoms that felt like a panic attack.  His wife gave him half a dose of her Ativan.  This improved his symptoms.  During this episode he was having some difficulty breathing and palpitations.  No chest pain.  Symptoms improved about 30 minutes after taking the Ativan.  No current chest pain or shortness of breath.  He has had panic attacks in the past.  States that this episode felt like his prior panic attacks.  Has been several years since his last panic attack.  ROS: Per HPI  PMH: Smoking history reviewed.  Former smoker.  Objective:  Physical Exam: BP 140/90   Pulse 67   Ht 6\' 4"  (1.93 m)   Wt 283 lb 12.8 oz (128.7 kg)   SpO2 99%   BMI 34.55 kg/m   Gen: NAD, resting comfortably CV: RRR with no murmurs appreciated Pulm: NWOB, CTAB with no crackles, wheezes, or rhonchi GI: Normal bowel sounds present. Soft, Nontender, Nondistended. MSK: No edema, cyanosis, or clubbing noted Skin: Warm, dry Neuro: Grossly normal, moves all extremities Psych: Normal affect and thought content  Assessment/Plan:  Anxiety Stable today.  Increase sertraline to 50 mg daily for 2 weeks.  Titrate to 100 mg daily afterwards.  Also gave prescription for hydroxyzine to use as needed.  Discouraged use of benzos from family members.  Return precautions reviewed.  Follow-up in 1 month.   [name], MD 03/22/2017 12:15 PM

## 2017-03-22 NOTE — Assessment & Plan Note (Signed)
Stable today.  Increase sertraline to 50 mg daily for 2 weeks.  Titrate to 100 mg daily afterwards.  Also gave prescription for hydroxyzine to use as needed.  Discouraged use of benzos from family members.  Return precautions reviewed.  Follow-up in 1 month.

## 2017-03-22 NOTE — Patient Instructions (Signed)
Increase the sertraline. Please increase to 1 tablet daily for 2 weeks. Then increase to 2 tablets daily for 2 weeks.  Use the hydroxyzine as needed.  Come back see me in 1 month, or sooner as needed.  Take care,  Dr [name]

## 2017-03-24 DIAGNOSIS — R3911 Hesitancy of micturition: Secondary | ICD-10-CM | POA: Diagnosis not present

## 2017-03-24 DIAGNOSIS — E875 Hyperkalemia: Secondary | ICD-10-CM | POA: Diagnosis not present

## 2017-03-24 DIAGNOSIS — E1129 Type 2 diabetes mellitus with other diabetic kidney complication: Secondary | ICD-10-CM | POA: Diagnosis not present

## 2017-03-24 DIAGNOSIS — N401 Enlarged prostate with lower urinary tract symptoms: Secondary | ICD-10-CM | POA: Diagnosis not present

## 2017-03-24 DIAGNOSIS — R809 Proteinuria, unspecified: Secondary | ICD-10-CM | POA: Diagnosis not present

## 2017-03-24 DIAGNOSIS — N2581 Secondary hyperparathyroidism of renal origin: Secondary | ICD-10-CM | POA: Diagnosis not present

## 2017-03-24 DIAGNOSIS — N183 Chronic kidney disease, stage 3 (moderate): Secondary | ICD-10-CM | POA: Diagnosis not present

## 2017-03-24 DIAGNOSIS — M869 Osteomyelitis, unspecified: Secondary | ICD-10-CM | POA: Diagnosis not present

## 2017-03-24 DIAGNOSIS — I129 Hypertensive chronic kidney disease with stage 1 through stage 4 chronic kidney disease, or unspecified chronic kidney disease: Secondary | ICD-10-CM | POA: Diagnosis not present

## 2017-03-24 DIAGNOSIS — D509 Iron deficiency anemia, unspecified: Secondary | ICD-10-CM | POA: Diagnosis not present

## 2017-03-24 DIAGNOSIS — Z6834 Body mass index (BMI) 34.0-34.9, adult: Secondary | ICD-10-CM | POA: Diagnosis not present

## 2017-03-27 ENCOUNTER — Ambulatory Visit (INDEPENDENT_AMBULATORY_CARE_PROVIDER_SITE_OTHER)
Admission: RE | Admit: 2017-03-27 | Discharge: 2017-03-27 | Disposition: A | Discharge status: Home / Self Care | Payer: Medicare Other | Source: Ambulatory Visit | Attending: Family Medicine | Admitting: Family Medicine

## 2017-03-27 DIAGNOSIS — M8588 Other specified disorders of bone density and structure, other site: Secondary | ICD-10-CM | POA: Diagnosis not present

## 2017-03-30 ENCOUNTER — Ambulatory Visit (INDEPENDENT_AMBULATORY_CARE_PROVIDER_SITE_OTHER): Payer: Medicare Other | Admitting: Surgical

## 2017-03-30 DIAGNOSIS — E538 Deficiency of other specified B group vitamins: Secondary | ICD-10-CM | POA: Diagnosis not present

## 2017-03-30 MED ORDER — CYANOCOBALAMIN 1000 MCG/ML IJ SOLN
1000.0000 ug | Freq: Once | INTRAMUSCULAR | Status: AC
  Administered 2017-03-30: 1000 ug via INTRAMUSCULAR

## 2017-03-30 NOTE — Progress Notes (Signed)
Patient came in today for B 12 injection. Injection given in left deltoid. Patient tolerated injection. Patient will schedule for his next injection for 1 month.

## 2017-03-30 NOTE — Progress Notes (Signed)
I have reviewed the patient's encounter and agree with the documentation.  David [name]. [name], MD 03/30/2017 4:21 PM

## 2017-03-30 NOTE — Progress Notes (Signed)
Normal bone density scan. No changes needed to current treatment plan.   David [name]. [name], MD 03/30/2017 11:01 AM

## 2017-04-04 ENCOUNTER — Encounter: Payer: Self-pay | Admitting: Sports Medicine

## 2017-04-04 ENCOUNTER — Ambulatory Visit (INDEPENDENT_AMBULATORY_CARE_PROVIDER_SITE_OTHER): Payer: Medicare Other | Admitting: Sports Medicine

## 2017-04-04 DIAGNOSIS — Z899 Acquired absence of limb, unspecified: Secondary | ICD-10-CM | POA: Diagnosis not present

## 2017-04-04 DIAGNOSIS — E0842 Diabetes mellitus due to underlying condition with diabetic polyneuropathy: Secondary | ICD-10-CM | POA: Diagnosis not present

## 2017-04-04 DIAGNOSIS — L97521 Non-pressure chronic ulcer of other part of left foot limited to breakdown of skin: Secondary | ICD-10-CM

## 2017-04-04 DIAGNOSIS — M2042 Other hammer toe(s) (acquired), left foot: Secondary | ICD-10-CM | POA: Diagnosis not present

## 2017-04-04 NOTE — Progress Notes (Signed)
Subjective: David [name] is a 76 y.o. male patient seen in office for follow up evaluation of ulceration of the left 3rd toe that remains healed. Patient has a history of diabetes and a blood glucose "good".  Patient states that area has healed up good and remains dry with no issues. Denies nausea/vomitting/fever/chills/night sweats or any constitutional symptoms. Patient has no other pedal complaints at this time.  Patient Active Problem List   Diagnosis Date Noted  . Anxiety 03/22/2017  . Acute cholecystitis s/p lap cholecystectomy 03/05/2017 03/04/2017  . HOCM (hypertrophic obstructive cardiomyopathy) ([place]) 03/04/2017  . 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 Fatigue 01/27/2017  . Low vitamin B12 level 01/27/2017  . Chronic ulcer of right foot ([place]) 08/09/2016  . Syncope 07/24/2015  . 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 OSA on CPAP 10/26/2014  . Back pain 10/16/2014  . Left leg numbness 10/16/2014  . Episodic lightheadedness 08/04/2014  . Open toe wound 07/18/2014  . Diabetes mellitus with neurological manifestations, uncontrolled ([place]) 02/27/2014  . Seasonal and perennial allergic rhinitis 10/25/2013  . Hypertension associated with diabetes ([place]) 10/25/2013  . Hyperlipidemia associated with type 2 diabetes mellitus ([place]) 10/25/2013  . Morbid obesity ([place]) 10/25/2013  . CKD stage 3 due to type 2 diabetes mellitus ([place]) 10/25/2013   Current Outpatient Medications on File Prior to Visit  Medication Sig Dispense Refill  . acetaminophen-codeine (TYLENOL #3) 300-30 MG tablet Take 1 tablet by mouth daily as needed.    Marland Kitchen aspirin EC 81 MG tablet Take 81 mg by mouth daily.    . calcitRIOL (ROCALTROL) 0.25 MCG capsule Take 0.25 mcg by mouth daily.     . carvedilol (COREG) 25 MG tablet TAKE ONE TABLET BY MOUTH TWICE DAILY WITH A MEAL 90 tablet 1  . cholecalciferol (VITAMIN D) 1000 units tablet Take 1 tablet (1,000 Units  total) by mouth daily. (Patient taking differently: Take 1,000 Units by mouth every evening. ) 30 tablet 1  . CONTOUR NEXT TEST test strip USE AS INSTRUCTED TO CHECK BLOOD SUGAR 6 TIMES PER DAY 200 each 11  . doxazosin (CARDURA) 4 MG tablet TAKE ONE TABLET BY MOUTH ONCE DAILY 90 tablet 0  . hydroxypropyl methylcellulose / hypromellose (ISOPTO TEARS / GONIOVISC) 2.5 % ophthalmic solution Place 1 drop into both eyes as needed for dry eyes.    . hydrOXYzine (ATARAX/VISTARIL) 50 MG tablet Take 1 tablet (50 mg total) 3 (three) times daily as needed by mouth. 30 tablet 1  . Icosapent Ethyl (VASCEPA) 1 g CAPS 2 caps bid 120 capsule 2  . Insulin NPH Human, Isophane, (HUMULIN N PEN [place]) Inject 0.25 mLs into the skin.    Marland Kitchen isosorbide mononitrate (IMDUR) 30 MG 24 hr tablet TAKE TWO TABLETS BY MOUTH ONCE DAILY 60 tablet 11  . liraglutide (VICTOZA) 18 MG/3ML SOPN INJECT 0.3MLS (1.8 MG TOTAL) INTO THE SKIN DAILY 9 pen 1  . liraglutide 18 MG/3ML SOPN Inject into the skin.    Marland Kitchen lisinopril (PRINIVIL,ZESTRIL) 5 MG tablet Take 5 mg by mouth every evening.     . Omega-3 Fatty Acids (FISH OIL) 1000 MG CAPS Take by mouth 2 (two) times daily.    . pantoprazole (PROTONIX) 40 MG tablet TAKE ONE TABLET BY MOUTH ONCE DAILY 90 tablet 1  . REGRANEX 0.01 % gel     . sertraline (ZOLOFT) 50 MG tablet Take 2 tablets (100 mg total) daily by mouth. 180 tablet  3  . torsemide (DEMADEX) 20 MG tablet Take 20 mg by mouth every evening.     . traMADol (ULTRAM) 50 MG tablet Take 1-2 tablets (50-100 mg total) by mouth every 6 (six) hours as needed for moderate pain or severe pain. 30 tablet 0   No current facility-administered medications on file prior to visit.    Allergies  Allergen Reactions  . Adhesive [Tape]     blisters    Recent Results (from the past 2160 hour(s))  CBC     Status: Abnormal   Collection Time: 01/27/17 11:56 AM  Result Value Ref Range   WBC 8.2 3.8 - 10.8 Thousand/uL   RBC 3.79 (L) 4.20 - 5.80 Million/uL    Hemoglobin 11.6 (L) 13.2 - 17.1 g/dL   HCT 34.6 (L) 38.5 - 50.0 %   MCV 91.3 80.0 - 100.0 fL   MCH 30.6 27.0 - 33.0 pg   MCHC 33.5 32.0 - 36.0 g/dL   RDW 14.7 11.0 - 15.0 %   Platelets 191 140 - 400 Thousand/uL   MPV 10.8 7.5 - 12.5 fL  Comprehensive metabolic panel     Status: Abnormal   Collection Time: 01/27/17 11:56 AM  Result Value Ref Range   Glucose, Bld 158 (H) 65 - 99 mg/dL    Comment: .            Fasting reference interval . For someone without known diabetes, a glucose value >125 mg/dL indicates that they may have diabetes and this should be confirmed with a follow-up test. .    BUN 43 (H) 7 - 25 mg/dL   Creat 1.82 (H) 0.70 - 1.18 mg/dL    Comment: For patients >76 years of age, the reference limit for Creatinine is approximately 13% higher for people identified as African-American. .    BUN/Creatinine Ratio 24 (H) 6 - 22 (calc)   Sodium 140 135 - 146 mmol/L   Potassium 5.4 (H) 3.5 - 5.3 mmol/L   Chloride 108 98 - 110 mmol/L   CO2 24 20 - 32 mmol/L   Calcium 9.3 8.6 - 10.3 mg/dL   Total Protein 6.6 6.1 - 8.1 g/dL   Albumin 3.9 3.6 - 5.1 g/dL   Globulin 2.7 1.9 - 3.7 g/dL (calc)   AG Ratio 1.4 1.0 - 2.5 (calc)   Total Bilirubin 0.4 0.2 - 1.2 mg/dL   Alkaline phosphatase (APISO) 97 40 - 115 U/L   AST 13 10 - 35 U/L   ALT 16 9 - 46 U/L  TSH     Status: None   Collection Time: 01/27/17 11:56 AM  Result Value Ref Range   TSH 1.82 0.40 - 4.50 mIU/L  Vitamin B12     Status: Abnormal   Collection Time: 01/27/17 11:56 AM  Result Value Ref Range   Vitamin B-12 170 (L) 200 - 1,100 pg/mL  VITAMIN D 25 Hydroxy (Vit-D Deficiency, Fractures)     Status: Abnormal   Collection Time: 01/27/17 11:56 AM  Result Value Ref Range   Vit D, 25-Hydroxy 21 (L) 30 - 100 ng/mL    Comment: Vitamin D Status         25-OH Vitamin D: . Deficiency:                    <20 ng/mL Insufficiency:             20 - 29 ng/mL Optimal:                 >  or = 30 ng/mL . For 25-OH  Vitamin D testing on patients on  D2-supplementation and patients for whom quantitation  of D2 and D3 fractions is required, the QuestAssureD(TM) 25-OH VIT D, (D2,D3), LC/MS/MS is recommended: order  code [phone] (patients >27yr). . For more information on this test, go to: http://education.questdiagnostics.com/faq/FAQ163 (This link is being provided for  informational/educational purposes only.)   Hemoglobin A1c     Status: Abnormal   Collection Time: 02/17/17  1:30 PM  Result Value Ref Range   Hgb A1c MFr Bld 8.2 (H) 4.6 - 6.5 %    Comment: Glycemic Control Guidelines for People with Diabetes:Non Diabetic:  <6%Goal of Therapy: <7%Additional Action Suggested:  >>8%  Basic metabolic panel     Status: Abnormal   Collection Time: 02/17/17  1:30 PM  Result Value Ref Range   Sodium 136 135 - 145 mEq/L   Potassium 5.0 3.5 - 5.1 mEq/L   Chloride 104 96 - 112 mEq/L   CO2 24 19 - 32 mEq/L   Glucose, Bld 217 (H) 70 - 99 mg/dL   BUN 37 (H) 6 - 23 mg/dL   Creatinine, Ser 2.04 (H) 0.40 - 1.50 mg/dL   Calcium 9.3 8.4 - 10.5 mg/dL   GFR 33.87 (L) >60.00 mL/min  Lipid panel     Status: Abnormal   Collection Time: 02/17/17  1:30 PM  Result Value Ref Range   Cholesterol 158 0 - 200 mg/dL    Comment: ATP III Classification       Desirable:  < 200 mg/dL               Borderline High:  200 - 239 mg/dL          High:  > = 240 mg/dL   Triglycerides 194.0 (H) 0.0 - 149.0 mg/dL    Comment: Normal:  <150 mg/dLBorderline High:  150 - 199 mg/dL   HDL 25.40 (L) >39.00 mg/dL   VLDL 38.8 0.0 - 40.0 mg/dL   LDL Cholesterol 94 0 - 99 mg/dL   Total CHOL/HDL Ratio 6     Comment:                Men          Women1/2 Average Risk     3.4          3.3Average Risk          5.0          4.42X Average Risk          9.6          7.13X Average Risk          15.0          11.0                       NonHDL 133.01     Comment: NOTE:  Non-HDL goal should be 30 mg/dL higher than patient's LDL goal (i.e. LDL goal of < 70  mg/dL, would have non-HDL goal of < 100 mg/dL)  CUP PACEART REMOTE DEVICE CHECK     Status: None   Collection Time: 02/21/17  2:47 PM  Result Value Ref Range   Date Time Interrogation Session [id number]   Pulse Generator Manufacturer MERM    Pulse Gen Model [id number] Advisa DR MRI    Pulse Gen Serial Number [id number]    [facility] Name [facility]   Implantable Pulse Generator Type Implantable  Pulse Generator    Implantable Pulse Generator Implant Date [id number]    Implantable Lead Manufacturer MERM    Implantable Lead Model 5076 CapSureFix Novus    Implantable Lead Serial Number [id number]    Implantable Lead Implant Date [id number]    Implantable Lead Location Detail 1 UNKNOWN    Implantable Lead Location [id number]    Implantable Lead Manufacturer MERM    Implantable Lead Model 5076 CapSureFix Novus    Implantable Lead Serial Number [id number]    Implantable Lead Implant Date 70350093    Implantable Lead Location Detail 1 UNKNOWN    Implantable Lead Location [phone]    Lead Channel Setting Sensing Sensitivity 2.8 mV   Lead Channel Setting Pacing Amplitude 2.5 V   Lead Channel Setting Pacing Pulse Width 0.4 ms   Lead Channel Setting Pacing Amplitude 2.5 V   Lead Channel Impedance Value 551 ohm   Lead Channel Impedance Value 418 ohm   Lead Channel Sensing Intrinsic Amplitude 1.25 mV   Lead Channel Sensing Intrinsic Amplitude 1.25 mV   Lead Channel Pacing Threshold Amplitude 2.25 V   Lead Channel Pacing Threshold Pulse Width 0.4 ms   Lead Channel Impedance Value 703 ohm   Lead Channel Impedance Value 418 ohm   Lead Channel Sensing Intrinsic Amplitude 21.25 mV   Lead Channel Sensing Intrinsic Amplitude 21.25 mV   Lead Channel Pacing Threshold Amplitude 1.25 V   Lead Channel Pacing Threshold Pulse Width 0.4 ms   Battery Status OK    Battery Remaining Longevity 80 mo   Battery Voltage 3.00 V   Brady Statistic RA Percent Paced 99.89 %   Brady Statistic RV Percent Paced 0.08 %    Brady Statistic AP VP Percent 0.06 %   Brady Statistic AS VP Percent 0 %   Brady Statistic AP VS Percent 99.84 %   Brady Statistic AS VS Percent 0.10 %   Eval Rhythm ApVs   CBC with Differential     Status: Abnormal   Collection Time: 03/04/17  9:22 AM  Result Value Ref Range   WBC 8.7 4.0 - 10.5 K/uL   RBC 3.71 (L) 4.22 - 5.81 MIL/uL   Hemoglobin 11.6 (L) 13.0 - 17.0 g/dL   HCT 34.8 (L) 39.0 - 52.0 %   MCV 93.8 78.0 - 100.0 fL   MCH 31.3 26.0 - 34.0 pg   MCHC 33.3 30.0 - 36.0 g/dL   RDW 14.0 11.5 - 15.5 %   Platelets 169 150 - 400 K/uL   Neutrophils Relative % 80 %   Neutro Abs 7.0 1.7 - 7.7 K/uL   Lymphocytes Relative 12 %   Lymphs Abs 1.0 0.7 - 4.0 K/uL   Monocytes Relative 7 %   Monocytes Absolute 0.6 0.1 - 1.0 K/uL   Eosinophils Relative 1 %   Eosinophils Absolute 0.1 0.0 - 0.7 K/uL   Basophils Relative 0 %   Basophils Absolute 0.0 0.0 - 0.1 K/uL  Comprehensive metabolic panel     Status: Abnormal   Collection Time: 03/04/17  9:22 AM  Result Value Ref Range   Sodium 136 135 - 145 mmol/L   Potassium 5.7 (H) 3.5 - 5.1 mmol/L   Chloride 105 101 - 111 mmol/L   CO2 25 22 - 32 mmol/L   Glucose, Bld 262 (H) 65 - 99 mg/dL   BUN 47 (H) 6 - 20 mg/dL   Creatinine, Ser 2.19 (H) 0.61 - 1.24 mg/dL   Calcium 9.9 8.9 - 10.3 mg/dL  Total Protein 7.3 6.5 - 8.1 g/dL   Albumin 3.9 3.5 - 5.0 g/dL   AST 21 15 - 41 U/L   ALT 28 17 - 63 U/L   Alkaline Phosphatase 96 38 - 126 U/L   Total Bilirubin 0.5 0.3 - 1.2 mg/dL   GFR calc non Af Amer 28 (L) >60 mL/min   GFR calc Af Amer 32 (L) >60 mL/min    Comment: (NOTE) The eGFR has been calculated using the CKD EPI equation. This calculation has not been validated in all clinical situations. eGFR's persistently <60 mL/min signify possible Chronic Kidney Disease.    Anion gap 6 5 - 15  Lipase, blood     Status: None   Collection Time: 03/04/17  9:22 AM  Result Value Ref Range   Lipase 29 11 - 51 U/L  Occult blood card to lab, stool  Provider will collect     Status: None   Collection Time: 03/04/17 10:00 AM  Result Value Ref Range   Fecal Occult Bld NEGATIVE NEGATIVE  Urinalysis, Routine w reflex microscopic     Status: Abnormal   Collection Time: 03/04/17 10:47 AM  Result Value Ref Range   Color, Urine STRAW (A) YELLOW   APPearance CLEAR CLEAR   Specific Gravity, Urine 1.010 1.005 - 1.030   pH 6.0 5.0 - 8.0   Glucose, UA >=500 (A) NEGATIVE mg/dL   Hgb urine dipstick SMALL (A) NEGATIVE   Bilirubin Urine NEGATIVE NEGATIVE   Ketones, ur NEGATIVE NEGATIVE mg/dL   Protein, ur 30 (A) NEGATIVE mg/dL   Nitrite NEGATIVE NEGATIVE   Leukocytes, UA NEGATIVE NEGATIVE  Urinalysis, Microscopic (reflex)     Status: Abnormal   Collection Time: 03/04/17 10:47 AM  Result Value Ref Range   RBC / HPF 0-5 0 - 5 RBC/hpf   WBC, UA 0-5 0 - 5 WBC/hpf   Bacteria, UA RARE (A) NONE SEEN   Squamous Epithelial / LPF NONE SEEN NONE SEEN  CBG monitoring, ED     Status: None   Collection Time: 03/04/17  5:05 PM  Result Value Ref Range   Glucose-Capillary 97 65 - 99 mg/dL  Basic metabolic panel     Status: Abnormal   Collection Time: 03/04/17  7:18 PM  Result Value Ref Range   Sodium 140 135 - 145 mmol/L   Potassium 4.7 3.5 - 5.1 mmol/L   Chloride 107 101 - 111 mmol/L   CO2 25 22 - 32 mmol/L   Glucose, Bld 80 65 - 99 mg/dL   BUN 40 (H) 6 - 20 mg/dL   Creatinine, Ser 2.19 (H) 0.61 - 1.24 mg/dL   Calcium 9.6 8.9 - 10.3 mg/dL   GFR calc non Af Amer 28 (L) >60 mL/min   GFR calc Af Amer 32 (L) >60 mL/min    Comment: (NOTE) The eGFR has been calculated using the CKD EPI equation. This calculation has not been validated in all clinical situations. eGFR's persistently <60 mL/min signify possible Chronic Kidney Disease.    Anion gap 8 5 - 15  Glucose, capillary     Status: None   Collection Time: 03/04/17  9:27 PM  Result Value Ref Range   Glucose-Capillary 87 65 - 99 mg/dL  Glucose, capillary     Status: None   Collection Time:  03/04/17 11:34 PM  Result Value Ref Range   Glucose-Capillary 99 65 - 99 mg/dL  Glucose, capillary     Status: Abnormal   Collection Time: 03/05/17  4:24  AM  Result Value Ref Range   Glucose-Capillary 147 (H) 65 - 99 mg/dL  Comprehensive metabolic panel     Status: Abnormal   Collection Time: 03/05/17  5:03 AM  Result Value Ref Range   Sodium 139 135 - 145 mmol/L   Potassium 5.9 (H) 3.5 - 5.1 mmol/L    Comment: DELTA CHECK NOTED SLIGHT HEMOLYSIS    Chloride 109 101 - 111 mmol/L   CO2 25 22 - 32 mmol/L   Glucose, Bld 161 (H) 65 - 99 mg/dL   BUN 40 (H) 6 - 20 mg/dL   Creatinine, Ser 2.24 (H) 0.61 - 1.24 mg/dL   Calcium 8.9 8.9 - 10.3 mg/dL   Total Protein 6.1 (L) 6.5 - 8.1 g/dL   Albumin 3.2 (L) 3.5 - 5.0 g/dL   AST 16 15 - 41 U/L   ALT 22 17 - 63 U/L   Alkaline Phosphatase 82 38 - 126 U/L   Total Bilirubin 0.5 0.3 - 1.2 mg/dL   GFR calc non Af Amer 27 (L) >60 mL/min   GFR calc Af Amer 31 (L) >60 mL/min    Comment: (NOTE) The eGFR has been calculated using the CKD EPI equation. This calculation has not been validated in all clinical situations. eGFR's persistently <60 mL/min signify possible Chronic Kidney Disease.    Anion gap 5 5 - 15  CBC     Status: Abnormal   Collection Time: 03/05/17  5:03 AM  Result Value Ref Range   WBC 8.3 4.0 - 10.5 K/uL   RBC 3.24 (L) 4.22 - 5.81 MIL/uL   Hemoglobin 10.0 (L) 13.0 - 17.0 g/dL   HCT 30.4 (L) 39.0 - 52.0 %   MCV 93.8 78.0 - 100.0 fL   MCH 30.9 26.0 - 34.0 pg   MCHC 32.9 30.0 - 36.0 g/dL   RDW 14.3 11.5 - 15.5 %   Platelets 159 150 - 400 K/uL  Hemoglobin A1c     Status: Abnormal   Collection Time: 03/05/17  5:03 AM  Result Value Ref Range   Hgb A1c MFr Bld 7.8 (H) 4.8 - 5.6 %    Comment: (NOTE) Pre diabetes:          5.7%-6.4% Diabetes:              >6.4% Glycemic control for   <7.0% adults with diabetes    Mean Plasma Glucose 177.16 mg/dL    Comment: Performed at [facility], [place] [address] [place], [place]  [address]  Glucose, capillary     Status: Abnormal   Collection Time: 03/05/17  7:49 AM  Result Value Ref Range   Glucose-Capillary 141 (H) 65 - 99 mg/dL  Potassium     Status: Abnormal   Collection Time: 03/05/17  9:25 AM  Result Value Ref Range   Potassium 5.8 (H) 3.5 - 5.1 mmol/L  Surgical PCR screen     Status: None   Collection Time: 03/05/17 10:45 AM  Result Value Ref Range   MRSA, PCR NEGATIVE NEGATIVE   Staphylococcus aureus NEGATIVE NEGATIVE    Comment: (NOTE) The Xpert SA Assay (FDA approved for NASAL specimens in patients 68 years of age and older), is one component of a comprehensive surveillance program. It is not intended to diagnose infection nor to guide or monitor treatment.   Glucose, capillary     Status: Abnormal   Collection Time: 03/05/17  2:26 PM  Result Value Ref Range   Glucose-Capillary 268 (H) 65 -  99 mg/dL  Glucose, capillary     Status: Abnormal   Collection Time: 03/05/17  9:10 PM  Result Value Ref Range   Glucose-Capillary 278 (H) 65 - 99 mg/dL   Comment 1 Notify RN    Comment 2 Document in Chart   CBC     Status: Abnormal   Collection Time: 03/06/17  4:01 AM  Result Value Ref Range   WBC 8.0 4.0 - 10.5 K/uL   RBC 3.02 (L) 4.22 - 5.81 MIL/uL   Hemoglobin 9.3 (L) 13.0 - 17.0 g/dL   HCT 28.3 (L) 39.0 - 52.0 %   MCV 93.7 78.0 - 100.0 fL   MCH 30.8 26.0 - 34.0 pg   MCHC 32.9 30.0 - 36.0 g/dL   RDW 14.2 11.5 - 15.5 %   Platelets 150 150 - 400 K/uL  Comprehensive metabolic panel     Status: Abnormal   Collection Time: 03/06/17  4:01 AM  Result Value Ref Range   Sodium 137 135 - 145 mmol/L   Potassium 5.7 (H) 3.5 - 5.1 mmol/L   Chloride 107 101 - 111 mmol/L   CO2 25 22 - 32 mmol/L   Glucose, Bld 220 (H) 65 - 99 mg/dL   BUN 42 (H) 6 - 20 mg/dL   Creatinine, Ser 2.30 (H) 0.61 - 1.24 mg/dL   Calcium 8.7 (L) 8.9 - 10.3 mg/dL   Total Protein 6.1 (L) 6.5 - 8.1 g/dL   Albumin 3.1 (L) 3.5 - 5.0 g/dL   AST 34 15 - 41 U/L   ALT 50 17 - 63 U/L    Alkaline Phosphatase 85 38 - 126 U/L   Total Bilirubin 0.7 0.3 - 1.2 mg/dL   GFR calc non Af Amer 26 (L) >60 mL/min   GFR calc Af Amer 30 (L) >60 mL/min    Comment: (NOTE) The eGFR has been calculated using the CKD EPI equation. This calculation has not been validated in all clinical situations. eGFR's persistently <60 mL/min signify possible Chronic Kidney Disease.    Anion gap 5 5 - 15  Glucose, capillary     Status: Abnormal   Collection Time: 03/06/17  7:31 AM  Result Value Ref Range   Glucose-Capillary 220 (H) 65 - 99 mg/dL  Glucose, capillary     Status: Abnormal   Collection Time: 03/06/17 11:28 AM  Result Value Ref Range   Glucose-Capillary 328 (H) 65 - 99 mg/dL  Basic metabolic panel     Status: Abnormal   Collection Time: 03/06/17  1:38 PM  Result Value Ref Range   Sodium 135 135 - 145 mmol/L   Potassium 5.0 3.5 - 5.1 mmol/L   Chloride 103 101 - 111 mmol/L   CO2 23 22 - 32 mmol/L   Glucose, Bld 249 (H) 65 - 99 mg/dL   BUN 41 (H) 6 - 20 mg/dL   Creatinine, Ser 2.30 (H) 0.61 - 1.24 mg/dL   Calcium 8.6 (L) 8.9 - 10.3 mg/dL   GFR calc non Af Amer 26 (L) >60 mL/min   GFR calc Af Amer 30 (L) >60 mL/min    Comment: (NOTE) The eGFR has been calculated using the CKD EPI equation. This calculation has not been validated in all clinical situations. eGFR's persistently <60 mL/min signify possible Chronic Kidney Disease.    Anion gap 9 5 - 15  Glucose, capillary     Status: Abnormal   Collection Time: 03/06/17  4:53 PM  Result Value Ref Range   Glucose-Capillary 164 (  H) 65 - 99 mg/dL  Glucose, capillary     Status: Abnormal   Collection Time: 03/06/17 10:23 PM  Result Value Ref Range   Glucose-Capillary 173 (H) 65 - 99 mg/dL  CBC     Status: Abnormal   Collection Time: 03/07/17  4:21 AM  Result Value Ref Range   WBC 6.8 4.0 - 10.5 K/uL   RBC 3.14 (L) 4.22 - 5.81 MIL/uL   Hemoglobin 9.6 (L) 13.0 - 17.0 g/dL   HCT 29.3 (L) 39.0 - 52.0 %   MCV 93.3 78.0 - 100.0 fL    MCH 30.6 26.0 - 34.0 pg   MCHC 32.8 30.0 - 36.0 g/dL   RDW 14.0 11.5 - 15.5 %   Platelets 150 150 - 400 K/uL  Comprehensive metabolic panel     Status: Abnormal   Collection Time: 03/07/17  4:21 AM  Result Value Ref Range   Sodium 139 135 - 145 mmol/L   Potassium 4.4 3.5 - 5.1 mmol/L   Chloride 103 101 - 111 mmol/L   CO2 27 22 - 32 mmol/L   Glucose, Bld 99 65 - 99 mg/dL   BUN 40 (H) 6 - 20 mg/dL   Creatinine, Ser 2.36 (H) 0.61 - 1.24 mg/dL   Calcium 8.7 (L) 8.9 - 10.3 mg/dL   Total Protein 6.3 (L) 6.5 - 8.1 g/dL   Albumin 3.2 (L) 3.5 - 5.0 g/dL   AST 23 15 - 41 U/L   ALT 39 17 - 63 U/L   Alkaline Phosphatase 83 38 - 126 U/L   Total Bilirubin 0.7 0.3 - 1.2 mg/dL   GFR calc non Af Amer 25 (L) >60 mL/min   GFR calc Af Amer 29 (L) >60 mL/min    Comment: (NOTE) The eGFR has been calculated using the CKD EPI equation. This calculation has not been validated in all clinical situations. eGFR's persistently <60 mL/min signify possible Chronic Kidney Disease.    Anion gap 9 5 - 15  Glucose, capillary     Status: Abnormal   Collection Time: 03/07/17  5:30 AM  Result Value Ref Range   Glucose-Capillary 59 (L) 65 - 99 mg/dL  Glucose, capillary     Status: None   Collection Time: 03/07/17  5:45 AM  Result Value Ref Range   Glucose-Capillary 68 65 - 99 mg/dL  Glucose, capillary     Status: Abnormal   Collection Time: 03/07/17  7:20 AM  Result Value Ref Range   Glucose-Capillary 132 (H) 65 - 99 mg/dL  Basic metabolic panel     Status: Abnormal   Collection Time: 03/14/17  1:37 PM  Result Value Ref Range   Sodium 140 135 - 145 mEq/L   Potassium 4.8 3.5 - 5.1 mEq/L   Chloride 103 96 - 112 mEq/L   CO2 30 19 - 32 mEq/L   Glucose, Bld 106 (H) 70 - 99 mg/dL   BUN 50 (H) 6 - 23 mg/dL   Creatinine, Ser 2.09 (H) 0.40 - 1.50 mg/dL   Calcium 9.6 8.4 - 10.5 mg/dL   GFR 32.93 (L) >60.00 mL/min    Objective: There were no vitals filed for this visit.  General: Patient is awake, alert,  oriented x 3 and in no acute distress.  Dermatology: Skin is warm and dry bilateral with a healed ulceration present left 3rd toe distal tuft, no erythema, no edema. No acute signs of infection. Nails are elongated, thick, and mycotic.    Vascular: Dorsalis Pedis pulse =  1/4 Bilateral,  Posterior Tibial pulse = 1/4 Bilateral,  Capillary Fill Time < 5 seconds  Neurologic: Protective sensation absent to the level of knee using  the 5.07/10g [employer].  Musculosketal:  No Pain with palpation to healed ulceration. No pain with compression to calves bilateral. Hammertoe and history of toe amps on right.  Assessment and Plan:  Problem List Items Addressed This Visit    None    Visit Diagnoses    Toe ulcer, left, limited to breakdown of skin ([place])    -  Primary   Healed   Diabetic polyneuropathy associated with diabetes mellitus due to underlying condition ([place])       History of amputation       Hammertoe of left foot         -Complete examination performed -Left 3rd toe ulcer remains healed -Patient does not want tenotomy procedure on left 3rd toe; recommend self stretching of toe for now  -Complimentarily Nails mechanically debrided using sterile chisel blade without incident -Patient to return to office in 10 weeks for follow up diabetic nail care.   Landis Martins, DPM

## 2017-04-18 ENCOUNTER — Other Ambulatory Visit: Payer: Self-pay | Admitting: Endocrinology

## 2017-04-21 ENCOUNTER — Ambulatory Visit (INDEPENDENT_AMBULATORY_CARE_PROVIDER_SITE_OTHER): Payer: Medicare Other | Admitting: Family Medicine

## 2017-04-21 ENCOUNTER — Encounter: Payer: Self-pay | Admitting: Family Medicine

## 2017-04-21 VITALS — BP 178/75 | HR 78 | Ht 76.0 in | Wt 291.0 lb

## 2017-04-21 DIAGNOSIS — R600 Localized edema: Secondary | ICD-10-CM

## 2017-04-21 DIAGNOSIS — I10 Essential (primary) hypertension: Secondary | ICD-10-CM

## 2017-04-21 DIAGNOSIS — I152 Hypertension secondary to endocrine disorders: Secondary | ICD-10-CM

## 2017-04-21 DIAGNOSIS — E1159 Type 2 diabetes mellitus with other circulatory complications: Secondary | ICD-10-CM

## 2017-04-21 DIAGNOSIS — F419 Anxiety disorder, unspecified: Secondary | ICD-10-CM | POA: Diagnosis not present

## 2017-04-21 NOTE — Patient Instructions (Signed)
Increase torsemide to twice daily.  Come back to see me next week or Dr Caryl Comes in 2 weeks.  Take care,  Dr [name]

## 2017-04-21 NOTE — Assessment & Plan Note (Signed)
Worsened today likely secondary to dietary indiscretions over the Thanksgiving weekend.  His lung exam is clear and he does not have any other signs or symptoms suggestive of acute heart failure.  He is up about 9 pounds since his visit last month.  We will increase his torsemide to twice daily dosing over the next 5-7 days.  He will follow-up with me in 1 week.  We will repeat BMET at that time.

## 2017-04-21 NOTE — Assessment & Plan Note (Signed)
Elevated today in setting of volume overload.  We will increase his torsemide for the next several days which will hopefully also help with the blood pressure.  No other changes to his blood pressure medications today.  Follow-up in 1 week.

## 2017-04-21 NOTE — Progress Notes (Signed)
    Subjective:  David [name] is a 76 y.o. male who presents today with a chief complaint of anxiety follow-up.   HPI:  Anxiety, established problem, stable Patient seen about a month ago for this.  At this time his dose of sertraline was increased.  He is currently on sertraline 100 mg daily.  Thinks that this is helped significantly with his symptoms.  He has not had to use much of the hydroxyzine.  No side effects noted.  Hypertension, established problem, worsening BP Readings from Last 3 Encounters:  04/21/17 (!) 178/75  03/22/17 140/90  03/14/17 136/76   Home BP monitoring: None Current Medications: Coreg 25 mg twice daily, lisinopril 5 mg daily, Imdur 30 mg daily compliant without side effects.  Lower extremity edema, established problem, worsening Patient has no significant worsening of his lower extremities over the past week.  Over this time is also noticed increased fatigue and may be a little bit more shortness of breath.  No reported dietary indiscretions, however does report to eating some ham over the Thanksgiving holiday.  He has been compliant with his torsemide- states that it makes him urinate noticeably for a few hours after taking it.  No chest pain.  No orthopnea.  No shortness of breath.  Reports seeing his nephrologist a couple of weeks ago and had lab work at that time which was normal.  ROS: Per HPI  PMH: Smoking history reviewed.  Former smoker.   Objective:  Physical Exam: BP (!) 178/75   Pulse 78   Ht 6\' 4"  (1.93 m)   Wt 291 lb (132 kg)   SpO2 97%   BMI 35.42 kg/m   Gen: NAD, resting comfortably CV: RRR with no murmurs appreciated Pulm: NWOB, CTAB with no crackles, wheezes, or rhonchi MSK: 2+ pitting edema to knees bilaterally.  Assessment/Plan:  Hypertension associated with diabetes ([place]) Elevated today in setting of volume overload.  We will increase his torsemide for the next several days which will hopefully also help with the blood  pressure.  No other changes to his blood pressure medications today.  Follow-up in 1 week.  Anxiety Doing much better on sertraline 100 mg daily and hydroxyzine 50 mg 3 times daily as needed.  Continue current regimen.  No changes needed at this time.  Lower extremity edema Worsened today likely secondary to dietary indiscretions over the Thanksgiving weekend.  His lung exam is clear and he does not have any other signs or symptoms suggestive of acute heart failure.  He is up about 9 pounds since his visit last month.  We will increase his torsemide to twice daily dosing over the next 5-7 days.  He will follow-up with me in 1 week.  We will repeat BMET at that time.   [name], MD 04/21/2017 1:48 PM

## 2017-04-21 NOTE — Assessment & Plan Note (Signed)
Doing much better on sertraline 100 mg daily and hydroxyzine 50 mg 3 times daily as needed.  Continue current regimen.  No changes needed at this time.

## 2017-04-26 ENCOUNTER — Other Ambulatory Visit: Payer: Self-pay

## 2017-04-26 MED ORDER — INSULIN REGULAR HUMAN (CONC) 500 UNIT/ML ~~LOC~~ SOLN: SUBCUTANEOUS | 1 refills | Status: DC

## 2017-04-28 ENCOUNTER — Encounter: Payer: Self-pay | Admitting: Family Medicine

## 2017-04-28 ENCOUNTER — Ambulatory Visit: Payer: Medicare Other

## 2017-04-28 ENCOUNTER — Ambulatory Visit (INDEPENDENT_AMBULATORY_CARE_PROVIDER_SITE_OTHER): Payer: Medicare Other | Admitting: Family Medicine

## 2017-04-28 VITALS — BP 162/66 | HR 66 | Ht 76.0 in | Wt 286.6 lb

## 2017-04-28 DIAGNOSIS — I152 Hypertension secondary to endocrine disorders: Secondary | ICD-10-CM

## 2017-04-28 DIAGNOSIS — E538 Deficiency of other specified B group vitamins: Secondary | ICD-10-CM | POA: Diagnosis not present

## 2017-04-28 DIAGNOSIS — E1159 Type 2 diabetes mellitus with other circulatory complications: Secondary | ICD-10-CM | POA: Diagnosis not present

## 2017-04-28 DIAGNOSIS — R600 Localized edema: Secondary | ICD-10-CM | POA: Diagnosis not present

## 2017-04-28 DIAGNOSIS — I10 Essential (primary) hypertension: Secondary | ICD-10-CM | POA: Diagnosis not present

## 2017-04-28 LAB — BASIC METABOLIC PANEL
BUN: 40 mg/dL — ABNORMAL HIGH (ref 6–23)
CO2: 31 mEq/L (ref 19–32)
Calcium: 9.2 mg/dL (ref 8.4–10.5)
Chloride: 101 mEq/L (ref 96–112)
Creatinine, Ser: 2.16 mg/dL — ABNORMAL HIGH (ref 0.40–1.50)
GFR: 31.69 mL/min — ABNORMAL LOW (ref >60.00)
Glucose, Bld: 128 mg/dL — ABNORMAL HIGH (ref 70–99)
Potassium: 4.4 mEq/L (ref 3.5–5.1)
Sodium: 139 mEq/L (ref 135–145)

## 2017-04-28 MED ORDER — CYANOCOBALAMIN 1000 MCG/ML IJ SOLN
1000.0000 ug | Freq: Once | INTRAMUSCULAR | Status: AC
  Administered 2017-04-28: 1000 ug via INTRAMUSCULAR

## 2017-04-28 MED ORDER — AMLODIPINE BESYLATE 10 MG PO TABS: 10.0000 mg | ORAL_TABLET | Freq: Every day | ORAL | 3 refills | Status: DC

## 2017-04-28 NOTE — Patient Instructions (Signed)
Start the amlodipine. Talk to your heart doctor about other blood pressure medication options.  Come back to see me in about a month.  Take care,  Dr [name]

## 2017-04-28 NOTE — Assessment & Plan Note (Signed)
Improved. Reduce torsemide back to once weekly. Discussed dietary restrictions. Also gave prescription for compression stockings. Check BMEt today. He will follow up with cardiology in 2 weeks and back with me in about a month.

## 2017-04-28 NOTE — Assessment & Plan Note (Signed)
Continues to be elevated. Start amlodipine 10mg  daily. Noted that this could possible worsen in LE edema, however given his HOCM, would avoid further diuretics at this time.

## 2017-04-28 NOTE — Assessment & Plan Note (Signed)
Monthly injection given today. Will recheck B12 levels in about 4 months.

## 2017-04-28 NOTE — Progress Notes (Signed)
    Subjective:  David [name] is a 76 y.o. male who presents today with a chief complaint of LE edema follow up.   HPI:  LE edema, establish problem, stable Patient seen about a week ago with about a 5 pound weight gain and some increase LE swelling. This was thought to be mostly secondary to dietary indescretions over the Thanksgiving holiday. His torsemide was increased to twice daily dosing at that time. Since then, he has done well. Swelling has improved. Down about 5 pounds.    Hypertension, established problem, Uncontrolled.  BP Readings from Last 3 Encounters:  04/28/17 (!) 162/66  04/21/17 (!) 178/75  03/22/17 140/90   Current Medications: coreg 25mg  bid, lisinopril 5mg  daily, imdur 30mg  daily. Interim History: Noted to be elevated at his last visit. Over the past week systolic has been ranging in the 150s-180s. No chest pain.   B12 Deficiency, Established, Stable Due for repeat injection today.   ROS: Per HPI  PMH: Smoking history reviewed. Former smoker.   Objective:  Physical Exam: BP (!) 162/66 (BP Location: Left Arm, Patient Position: Sitting, Cuff Size: Normal)   Pulse 66   Ht 6\' 4"  (1.93 m)   Wt 286 lb 9.6 oz (130 kg)   SpO2 98%   BMI 34.89 kg/m   Gen: NAD, resting comfortably CV: RRR with no murmurs appreciated Pulm: NWOB, CTAB with no crackles, wheezes, or rhonchi MSK: 1-2+ pitting edema to knees bilatera  Assessment/Plan:  Lower extremity edema Improved. Reduce torsemide back to once weekly. Discussed dietary restrictions. Also gave prescription for compression stockings. Check BMEt today. He will follow up with cardiology in 2 weeks and back with me in about a month.   Hypertension associated with diabetes ([place]) Continues to be elevated. Start amlodipine 10mg  daily. Noted that this could possible worsen in LE edema, however given his HOCM, would avoid further diuretics at this time.   Low vitamin B12 level Monthly injection given today. Will  recheck B12 levels in about 4 months.   [name], MD 04/28/2017 5:05 PM

## 2017-05-01 NOTE — Progress Notes (Signed)
Kidney function and electrolyte levels are stable.  [name], MD 05/01/2017 8:46 AM

## 2017-05-12 ENCOUNTER — Ambulatory Visit (INDEPENDENT_AMBULATORY_CARE_PROVIDER_SITE_OTHER): Payer: Medicare Other | Admitting: Internal Medicine

## 2017-05-12 ENCOUNTER — Encounter: Payer: Self-pay | Admitting: Internal Medicine

## 2017-05-12 VITALS — BP 132/60 | HR 155 | Ht 76.0 in | Wt 298.2 lb

## 2017-05-12 DIAGNOSIS — Z950 Presence of cardiac pacemaker: Secondary | ICD-10-CM | POA: Diagnosis not present

## 2017-05-12 DIAGNOSIS — I422 Other hypertrophic cardiomyopathy: Secondary | ICD-10-CM

## 2017-05-12 DIAGNOSIS — I495 Sick sinus syndrome: Secondary | ICD-10-CM | POA: Diagnosis not present

## 2017-05-12 LAB — CUP PACEART INCLINIC DEVICE CHECK
Battery Remaining Longevity: 78 mo
Battery Voltage: 3 V
Brady Statistic AP VP Percent: 0.05 %
Brady Statistic AP VS Percent: 99.88 %
Brady Statistic AS VP Percent: 0 %
Brady Statistic AS VS Percent: 0.08 %
Brady Statistic RA Percent Paced: 99.92 %
Brady Statistic RV Percent Paced: 0.05 %
Date Time Interrogation Session: 20181228164424
Implantable Lead Implant Date: 20091102
Implantable Lead Implant Date: 20091102
Implantable Lead Location: 753859
Implantable Lead Location: 753860
Implantable Lead Model: 5076
Implantable Lead Model: 5076
Implantable Pulse Generator Implant Date: 20170118
Lead Channel Impedance Value: 418 Ohm
Lead Channel Impedance Value: 437 Ohm
Lead Channel Impedance Value: 570 Ohm
Lead Channel Impedance Value: 817 Ohm
Lead Channel Pacing Threshold Amplitude: 1.75 V
Lead Channel Pacing Threshold Amplitude: 2 V
Lead Channel Pacing Threshold Pulse Width: 0.4 ms
Lead Channel Pacing Threshold Pulse Width: 0.4 ms
Lead Channel Sensing Intrinsic Amplitude: 1.25 mV
Lead Channel Sensing Intrinsic Amplitude: 1.75 mV
Lead Channel Sensing Intrinsic Amplitude: 21.75 mV
Lead Channel Sensing Intrinsic Amplitude: 23.125 mV
Lead Channel Setting Pacing Amplitude: 2.5 V
Lead Channel Setting Pacing Amplitude: 3.25 V
Lead Channel Setting Pacing Pulse Width: 0.4 ms
Lead Channel Setting Sensing Sensitivity: 2.8 mV

## 2017-05-12 NOTE — Patient Instructions (Signed)
Medication Instructions: Your physician recommends that you continue on your current medications as directed. Please refer to the Current Medication list given to you today.  Labwork: None Ordered  Procedures/Testing: None Ordered  Follow-Up: Your physician wants you to follow-up in: 1 YEAR with Dr. Caryl Comes. You will receive a reminder letter in the mail two months in advance. If you don't receive a letter, please call our office to schedule the follow-up appointment.  Remote monitoring is used to monitor your Pacemaker from home. This monitoring reduces the number of office visits required to check your device to one time per year. It allows [name] to keep an eye on the functioning of your device to ensure it is working properly. You are scheduled for a device check from home on 08/14/17. You may send your transmission at any time that day. If you have a wireless device, the transmission will be sent automatically. After your physician reviews your transmission, you will receive a postcard with your next transmission date.   If you need a refill on your cardiac medications before your next appointment, please call your pharmacy.

## 2017-05-12 NOTE — Progress Notes (Signed)
Patient Care Team: [name], MD as PCP - General (Family Medicine) [name], MD as Consulting Physician (Podiatry) [name], MD as Consulting Physician (Nephrology) Elayne Snare, MD as Consulting Physician (Endocrinology) [name], MD as Consulting Physician (Cardiology)   HPI  David [name] is a 76 y.o. male Seen in followup for pacemaker implanted for sinus node dysfunction and dyspnea on exertion. He has been diagnosed with hypertrophic cardiomyopathy following review of his films by Dr. Farrel Conners at [facility] exhibited stable. He denies chest pain.  At his last visit, because of significant shortness of breath, we have reprogrammed his rate response down.  He is also had problems with edema and this is been managed by renal   Echocardiogram 6/15 normal LV function and severe left ventricular hypertrophy with left atrial enlargement consistent with hypertensive heart disease  Evaluation [place] included a Myoview that was false positive catheterizations were undertaken in 2009, then, 2012 demonstrating 60% of the RCA, 70% in 240% circumflex and 60% OM1 40% LAD.     Date Cr K Hgb  3/18 2.45 5.4   5/18 1.9 4.9   12/18 2.16 4.4 9.6     In the interim he is seeing Dr. Broadus John who has initiated genetic testing.   He is markedly limited in his activity related to dyspnea, weight, and feet following amputation Not withstanding, he has struggled more recently with edema.  Efforts to address have been limited by renal insufficiency.  Most recent creatinine was after a 1 week exposure to twice daily diuretic  He is not exercising.  In the past, he has noted that exercise bicycling has been associated with improved leg strength   There is no family history of sudden death. There is no known history of HCM   Past Medical History:  Diagnosis Date  . Anemia, iron deficiency   . Anxiety   . Arthritis   . BPH (benign prostatic hypertrophy)   . CAD  (coronary artery disease)    Nonobstructive CAD per cath  . Cardiac pacemaker in situ   . CHF (congestive heart failure) ([place])   . Chronic ulcer of right foot ([place])   . 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 Dyspnea   . History of cellulitis    right great toe 10-25-2014  . History of skin cancer   . HOCM (hypertrophic obstructive cardiomyopathy) ([name])   . Hypertension   . 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 Insulin pump in place   . OSA on CPAP   . Peripheral neuropathy    severe  . Peripheral vascular disease ([place])    bilateral lower extremities  . Rib fracture 07/24/2015  . Secondary hyperparathyroidism of renal origin ([place])   . Sinus node dysfunction [facility])     Past Surgical History:  Procedure Laterality Date  . 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 AMPUTATION TOE Right 03/12/2015   Procedure: RIGHT HALLUS AMPUTATION ;  Surgeon: [name], MD;  Location: [facility];  Service: Podiatry;  Laterality: Right;  . CARDIAC CATHETERIZATION  11-25-2010   [place], [place]   Nonobstructive CAD  . CARDIAC PACEMAKER PLACEMENT  Nov 2009   Medtronic  . CHOLECYSTECTOMY N/A 03/05/2017   Procedure: LAPAROSCOPIC CHOLECYSTECTOMY WITH INTRAOPERATIVE CHOLANGIOGRAM;  Surgeon:  [name], MD;  Location: [facility];  Service: General;  Laterality: N/A;  . EP IMPLANTABLE DEVICE N/A 06/03/2015   Procedure: PPM Generator Changeout;  Surgeon: [name], MD;  Location: [facility];  Service: Cardiovascular;  Laterality: N/A;  . EXCISION BONE CYST Right 03/06/2015   Procedure: BONE BIOPSIES OF RIGHT FOOT;  Surgeon: [name], MD;  Location: [place] [facility];  Service: Podiatry;  Laterality: Right;  . ORIF ANKLE FRACTURE Left 11/06/2014   Procedure: OPEN REDUCTION INTERNAL FIXATION (ORIF) LEFT  ANKLE  FRACTURE;  Surgeon: [name], MD;  Location: [facility];  Service: Orthopedics;  Laterality: Left;  . TOTAL KNEE ARTHROPLASTY    . VEIN LIGATION AND STRIPPING      Current Outpatient Medications  Medication Sig Dispense Refill  . amLODipine (NORVASC) 10 MG tablet Take 1 tablet (10 mg total) by mouth daily. 90 tablet 3  . aspirin EC 81 MG tablet Take 81 mg by mouth daily.    . calcitRIOL (ROCALTROL) 0.25 MCG capsule Take 0.25 mcg by mouth daily.     . carvedilol (COREG) 25 MG tablet TAKE ONE TABLET BY MOUTH TWICE DAILY WITH A MEAL 90 tablet 1  . cholecalciferol (VITAMIN D) 1000 units tablet Take 1 tablet (1,000 Units total) by mouth daily. (Patient taking differently: Take 1,000 Units by mouth every evening. ) 30 tablet 1  . CONTOUR NEXT TEST test strip USE AS INSTRUCTED TO CHECK BLOOD SUGAR 6 TIMES PER DAY 200 each 11  . doxazosin (CARDURA) 4 MG tablet TAKE ONE TABLET BY MOUTH ONCE DAILY 90 tablet 0  . hydroxypropyl methylcellulose / hypromellose (ISOPTO TEARS / GONIOVISC) 2.5 % ophthalmic solution Place 1 drop into both eyes as needed for dry eyes.    . hydrOXYzine (ATARAX/VISTARIL) 50 MG tablet Take 1 tablet (50 mg total) 3 (three) times daily as needed by mouth. 30 tablet 1  . Icosapent Ethyl (VASCEPA) 1 g CAPS 2 caps bid 120 capsule 2  . insulin regular human CONCENTRATED (HUMULIN R) 500 UNIT/ML injection USE UP TO 0.5 ML IN INSULIN PUMP daily. 1 vial 1  . isosorbide mononitrate (IMDUR) 30 MG 24 hr tablet TAKE TWO TABLETS BY MOUTH ONCE DAILY 60 tablet 11  . liraglutide (VICTOZA) 18 MG/3ML SOPN INJECT 0.3MLS (1.8 MG TOTAL) INTO THE SKIN DAILY 9 pen 1  . lisinopril (PRINIVIL,ZESTRIL) 5 MG tablet Take 5 mg by mouth every evening.     . Omega-3 Fatty Acids (FISH OIL) 1000 MG CAPS Take by mouth 2 (two) times daily.    . pantoprazole (PROTONIX) 40 MG tablet TAKE ONE TABLET BY MOUTH ONCE DAILY 90 tablet 1  . sertraline (ZOLOFT) 100 MG tablet Take 100 mg by mouth daily.    Marland Kitchen  torsemide (DEMADEX) 20 MG tablet Take 20 mg by mouth 2 (two) times daily.     . traMADol (ULTRAM) 50 MG tablet Take 1-2 tablets (50-100 mg total) by mouth every 6 (six) hours as needed for moderate pain or severe pain. 30 tablet 0   No current facility-administered medications for this visit.     Allergies  Allergen Reactions  . Adhesive [Tape]     blisters    Review of Systems negative except from HPI and PMH  Physical Exam BP 132/60   Pulse (!) 155   Ht 6\' 4"  (1.93 m)   Wt 298 lb 3.2 oz (135.3 kg)   SpO2 96%   BMI 36.30 kg/m  Well developed and  well nourished in no acute distress HENT normal JVP 8 E scleral and icterus clear Neck Supples No clubbing cyanosis 2+ Alert and oriented, grossly normal motor and sensory function Skin Warm and Dry  ECG demonstrates atrial pacing at 75 Intervals 22/12/40  Assessment and  Plan Sinus node dysfunction Pacemaker  Medtronic  Renal dysfunction Congestive Heart Failure Hypertension  Sleep apnea-treated Diabetes HCM  We reviewed the physiology of congestive heart failure in the setting of hypertrophic heart disease and HCM    We also reviewed again the physiology of heart failure in general as relates to volume intake diuresis and the balancing of renal insufficiency.  We will try a one-week trial of increasing his diuretics to twice daily.  He will decrease his fluid intake.  We discussed the importance of exercise.  He will get an exercise bicycle.  I encouraged him to establish close follow-up regarding his renal function and his fluids status  We spent more than 50% of our >25 min visit in face to face counseling regarding the above

## 2017-05-23 ENCOUNTER — Other Ambulatory Visit (INDEPENDENT_AMBULATORY_CARE_PROVIDER_SITE_OTHER): Payer: Medicare Other

## 2017-05-23 DIAGNOSIS — E1165 Type 2 diabetes mellitus with hyperglycemia: Secondary | ICD-10-CM

## 2017-05-23 DIAGNOSIS — Z794 Long term (current) use of insulin: Secondary | ICD-10-CM

## 2017-05-23 LAB — LIPID PANEL
Cholesterol: 168 mg/dL (ref 0–200)
HDL: 22.2 mg/dL — ABNORMAL LOW (ref >39.00)
NonHDL: 146.12
Total CHOL/HDL Ratio: 8
Triglycerides: 282 mg/dL — ABNORMAL HIGH (ref 0.0–149.0)
VLDL: 56.4 mg/dL — ABNORMAL HIGH (ref 0.0–40.0)

## 2017-05-23 LAB — COMPREHENSIVE METABOLIC PANEL
ALT: 21 U/L (ref 0–53)
AST: 19 U/L (ref 0–37)
Albumin: 4 g/dL (ref 3.5–5.2)
Alkaline Phosphatase: 82 U/L (ref 39–117)
BUN: 39 mg/dL — ABNORMAL HIGH (ref 6–23)
CO2: 28 mEq/L (ref 19–32)
Calcium: 9.2 mg/dL (ref 8.4–10.5)
Chloride: 104 mEq/L (ref 96–112)
Creatinine, Ser: 2.03 mg/dL — ABNORMAL HIGH (ref 0.40–1.50)
GFR: 34.04 mL/min — ABNORMAL LOW (ref >60.00)
Glucose, Bld: 152 mg/dL — ABNORMAL HIGH (ref 70–99)
Potassium: 4.6 mEq/L (ref 3.5–5.1)
Sodium: 138 mEq/L (ref 135–145)
Total Bilirubin: 0.4 mg/dL (ref 0.2–1.2)
Total Protein: 6.7 g/dL (ref 6.0–8.3)

## 2017-05-23 LAB — HEMOGLOBIN A1C: Hgb A1c MFr Bld: 8.1 % — ABNORMAL HIGH (ref 4.6–6.5)

## 2017-05-23 LAB — LDL CHOLESTEROL, DIRECT: Direct LDL: 89 mg/dL

## 2017-05-25 ENCOUNTER — Encounter: Payer: Self-pay | Admitting: Endocrinology

## 2017-05-25 ENCOUNTER — Ambulatory Visit (INDEPENDENT_AMBULATORY_CARE_PROVIDER_SITE_OTHER): Payer: Medicare Other | Admitting: Endocrinology

## 2017-05-25 VITALS — BP 156/60 | HR 80 | Ht 76.0 in | Wt 296.0 lb

## 2017-05-25 DIAGNOSIS — E782 Mixed hyperlipidemia: Secondary | ICD-10-CM | POA: Diagnosis not present

## 2017-05-25 DIAGNOSIS — E1165 Type 2 diabetes mellitus with hyperglycemia: Secondary | ICD-10-CM | POA: Diagnosis not present

## 2017-05-25 DIAGNOSIS — Z794 Long term (current) use of insulin: Secondary | ICD-10-CM | POA: Diagnosis not present

## 2017-05-25 NOTE — Patient Instructions (Addendum)
Must bolus for all meals regardless of sugar level  More sugars at bedtime  Change tubing on Tuesdays and Saturdays

## 2017-05-25 NOTE — Progress Notes (Signed)
Patient ID: David [name], male   DOB: 06-23-1940, 77 y.o.   MRN: [id number]    Reason for Appointment: Followup for Type 2 Diabetes  History of Present Illness:          Diagnosis: Type 2 diabetes mellitus, date of diagnosis:   1992       Past history:  He was initially treated with metformin and at some point also glipizide. His previous records from out of town are not available and no information is available about his level of control Apparently he was started on insulin in 1994 approximately because of poor control He has been on various insulin regimens over the last several years However even with insulin he has had poor control for at least the last 7 or 8 years. He does not know what his previous A1c levels have been. He had been continued on metformin and glipizide but metformin stopped about 2 years ago because of kidney function abnormality He had been taking Lantus 60 units twice a day with NovoLog previously and also Before his initial consultation in 6/15 he was on NovoLog twice a day and Humalog mix insulin  Because of poor control and large insulin doses he was started on Victoza in 6/15  Recent history:   INSULIN PUMP  regimen with U-500 insulin is as follows BASAL rates:Midnight = 0.8 ,6 AM = 1.1, 3 PM = 0.9 and 9 PM = 0.7    I/C ratio: 20, ISF: 45, target 120-150.   Since 04/28/14 he has been on a Medtronic insulin pump because of persistent poor control and high insulin requirement  Initially with the pump his blood sugars had been significantly better but subsequently blood sugars have been inadequately controlled Has had A1c mostly over 8%  His A1c did not improve, now 8.1, previously 7.8   Current management, problems and blood sugar patterns:  His blood sugars are significantly better fasting compared to his last visit  However blood sugars are mostly high around suppertime  As before he does not check readings after meals  Despite  reminders he does not bolus for his meals and at least on 4 days in the last 2 weeks he has had no boluses all day   He was also told to change his infusion sets every 3-4 days but he appears to be doing them every 10-12 days and may not be feeling his cannula on the set change  He thinks he is getting low blood sugars but he has had only one low normal reading of 65 in the early afternoon and a couple of readings in the 70s between 2-7 PM  Not clear if his blood sugars are controlled with his suppertime boluses since he does not check them after eating  Also even though he thinks he is eating small portions and his weight has gone up  He is not very comfortable changing his own settings on his own  He is doing 1.8 mg Victoza injections daily also as before       Oral hypoglycemic drugs the patient is taking are: None, has renal dysfunction        Glucose monitoring:  done about 3 times a day         Glucometer:  contour     Blood Glucose readings from pump download:  Mean values apply above for all meters except median for One Touch  PRE-MEAL Fasting Lunch Dinner Bedtime Overall  Glucose range: 91-254  70-258     Mean/median: ?  141   209   147     Self-care:   Meals: 2- 3 meals per day. May skip breakfast otherwise breakfast is Just toast.  Will have half sandwich at 1 pm , dinner at  6-7 pm.    He thinks his portions are small but periodically eats sweets.   Bedtime snack is usually crackers  with milk or fruit         Exercise:  Walking some   Last  consultation : Most recent: several years ago.       Wt Readings from Last 3 Encounters:  05/25/17 296 lb (134.3 kg)  05/12/17 298 lb 3.2 oz (135.3 kg)  04/28/17 286 lb 9.6 oz (130 kg)   Glycemic control:   Lab Results  Component Value Date   HGBA1C 8.1 (H) 05/23/2017   HGBA1C 7.8 (H) 03/05/2017   HGBA1C 8.2 (H) 02/17/2017   Lab Results  Component Value Date   MICROALBUR 7.7 (H) 10/07/2016   LDLCALC 94 02/17/2017     CREATININE 2.03 (H) 05/23/2017            Lab Results  Component Value Date   FRUCTOSAMINE 328 (H) 08/05/2016   FRUCTOSAMINE 326 (H) 01/30/2014     Other active problems: See review of systems   Allergies as of 05/25/2017      Reactions   Adhesive [tape]    blisters      Medication List        Accurate as of 05/25/17  1:24 PM. Always use your most recent med list.          amLODipine 10 MG tablet Commonly known as:  NORVASC Take 1 tablet (10 mg total) by mouth daily.   aspirin EC 81 MG tablet Take 81 mg by mouth daily.   calcitRIOL 0.25 MCG capsule Commonly known as:  ROCALTROL Take 0.25 mcg by mouth daily.   carvedilol 25 MG tablet Commonly known as:  COREG TAKE ONE TABLET BY MOUTH TWICE DAILY WITH A MEAL   cholecalciferol 1000 units tablet Commonly known as:  VITAMIN D Take 1 tablet (1,000 Units total) by mouth daily.   CONTOUR NEXT TEST test strip Generic drug:  glucose blood USE AS INSTRUCTED TO CHECK BLOOD SUGAR 6 TIMES PER DAY   doxazosin 4 MG tablet Commonly known as:  CARDURA TAKE ONE TABLET BY MOUTH ONCE DAILY   Fish Oil 1000 MG Caps Take by mouth 2 (two) times daily.   hydroxypropyl methylcellulose / hypromellose 2.5 % ophthalmic solution Commonly known as:  ISOPTO TEARS / GONIOVISC Place 1 drop into both eyes as needed for dry eyes.   hydrOXYzine 50 MG tablet Commonly known as:  ATARAX/VISTARIL Take 1 tablet (50 mg total) 3 (three) times daily as needed by mouth.   Icosapent Ethyl 1 g Caps Commonly known as:  VASCEPA 2 caps bid   insulin regular human CONCENTRATED 500 UNIT/ML injection Commonly known as:  HUMULIN R USE UP TO 0.5 ML IN INSULIN PUMP daily.   isosorbide mononitrate 30 MG 24 hr tablet Commonly known as:  IMDUR TAKE TWO TABLETS BY MOUTH ONCE DAILY   liraglutide 18 MG/3ML Sopn Commonly known as:  VICTOZA INJECT 0.3MLS (1.8 MG TOTAL) INTO THE SKIN DAILY   lisinopril 5 MG tablet Commonly known as:   PRINIVIL,ZESTRIL Take 5 mg by mouth every evening.   pantoprazole 40 MG tablet Commonly known as:  PROTONIX TAKE ONE TABLET BY MOUTH  ONCE DAILY   sertraline 100 MG tablet Commonly known as:  ZOLOFT Take 100 mg by mouth daily.   torsemide 20 MG tablet Commonly known as:  DEMADEX Take 20 mg by mouth 2 (two) times daily.   traMADol 50 MG tablet Commonly known as:  ULTRAM Take 1-2 tablets (50-100 mg total) by mouth every 6 (six) hours as needed for moderate pain or severe pain.       Allergies:  Allergies  Allergen Reactions  . Adhesive [Tape]     blisters    Past Medical History:  Diagnosis Date  . Anemia, iron deficiency   . Anxiety   . Arthritis   . BPH (benign prostatic hypertrophy)   . CAD (coronary artery disease)    Nonobstructive CAD per cath  . Cardiac pacemaker in situ   . CHF (congestive heart failure) ([place])   . Chronic ulcer of right foot ([place])   . CKD (chronic kidney disease), stage III ([place]) secondary to DM and HTN   nephrologist-  [name]  . Dyspnea   . History of cellulitis    right great toe 10-25-2014  . History of skin cancer   . HOCM (hypertrophic obstructive cardiomyopathy) ([place])   . Hypertension   . Insulin dependent type 2 diabetes mellitus ([place]) 1991   followd by [name]--  has insulin pump  . Insulin pump in place   . OSA on CPAP   . Peripheral neuropathy    severe  . Peripheral vascular disease ([place])    bilateral lower extremities  . Rib fracture 07/24/2015  . Secondary hyperparathyroidism of renal origin ([place])   . Sinus node dysfunction [facility])     Past Surgical History:  Procedure Laterality Date  . 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 AMPUTATION TOE Right 03/12/2015   Procedure: RIGHT HALLUS AMPUTATION ;  Surgeon: [name], MD;  Location: [facility];  Service: Podiatry;  Laterality: Right;  . CARDIAC CATHETERIZATION  11-25-2010   [place], [place]   Nonobstructive CAD   . CARDIAC PACEMAKER PLACEMENT  Nov 2009   Medtronic  . CHOLECYSTECTOMY N/A 03/05/2017   Procedure: LAPAROSCOPIC CHOLECYSTECTOMY WITH INTRAOPERATIVE CHOLANGIOGRAM;  Surgeon: [name], MD;  Location: [facility];  Service: General;  Laterality: N/A;  . EP IMPLANTABLE DEVICE N/A 06/03/2015   Procedure: PPM Generator Changeout;  Surgeon: [name], MD;  Location: [facility];  Service: Cardiovascular;  Laterality: N/A;  . EXCISION BONE CYST Right 03/06/2015   Procedure: BONE BIOPSIES OF RIGHT FOOT;  Surgeon: [name], MD;  Location: [facility];  Service: Podiatry;  Laterality: Right;  . ORIF ANKLE FRACTURE Left 11/06/2014   Procedure: OPEN REDUCTION INTERNAL FIXATION (ORIF) LEFT  ANKLE FRACTURE;  Surgeon: [name], MD;  Location: [facility];  Service: Orthopedics;  Laterality: Left;  . TOTAL KNEE ARTHROPLASTY    . VEIN LIGATION AND STRIPPING      Family History  Problem Relation Age of Onset  . Cancer Mother        breast  . Heart attack Father     Social History:  reports that he quit smoking about 33 years ago. He has a 60.00 pack-year smoking history. He has quit using smokeless tobacco. He reports that he drinks about 0.6 oz of alcohol per week. He reports that he does not use drugs.    Review of Systems        Lipids:  Had been on a Lipitor since late 2014. Also had taken Gembrozil for several years without apparent side effects.  Both of these were  stopped because of increased liver functions and LFT has been back to normal  HDL is consistently low Again LDL is below 100   He is taking  fish oil OTC  but triglycerides are high, was prescribed Vascepa but triglycerides are still not better   Lab Results  Component Value Date   CHOL 168 05/23/2017   CHOL 158 02/17/2017   CHOL 152 08/05/2016   Lab Results  Component Value Date   HDL 22.20 (L) 05/23/2017   HDL 25.40 (L) 02/17/2017   HDL 19.90 (L) 08/05/2016   Lab Results   Component Value Date   LDLCALC 94 02/17/2017   LDLCALC 49 10/29/2013   Lab Results  Component Value Date   TRIG 282.0 (H) 05/23/2017   TRIG 194.0 (H) 02/17/2017   TRIG 243.0 (H) 08/05/2016   Lab Results  Component Value Date   CHOLHDL 8 05/23/2017   CHOLHDL 6 02/17/2017   CHOLHDL 8 08/05/2016   Lab Results  Component Value Date   LDLDIRECT 89.0 05/23/2017   LDLDIRECT 77.0 08/05/2016   LDLDIRECT 68.0 11/13/2015      HYPERTENSION: The blood pressure has been treated with various drugs by cardiologist.    Chronic kidney disease: Etiology unknown, is followed by nephrologist  Last creatinine:  Lab Results  Component Value Date   CREATININE 2.03 (H) 05/23/2017        Has history of Numbness in his feet  since about 2013  He has been wearing diabetic shoes.  He has been followed by podiatrist He is  under the care of a podiatrist for Regular care     Physical Examination:  BP (!) 156/60   Pulse 80   Ht 6\' 4"  (1.93 m)   Wt 296 lb (134.3 kg)   SpO2 93%   BMI 36.03 kg/m     ASSESSMENT:  Diabetes type 2, uncontrolled with obesity See history of present illness for detailed discussion of current diabetes management, blood sugar patterns and problems identified   His last A1c was 7.8 but is now slightly higher again at 8.1 This is despite his blood sugars averaging only about 147 in the last 2 weeks on his pump  Most likely has postprandial hyperglycemia and he does not check his readings after meals Also has frequent missed boluses, despite eating at least 2 meals a day he is having and bolus average of 0.8 per day Also not changing his infusion set regularly even though this has been emphasized to a couple of times at least No monitoring available after meals also His weight has gone up Day-to-day management of insulin pump and glucose monitoring was discussed in detail   LIPIDS: He will need to improve his diet and cut back on high-fat and high carbohydrate  foods and snacks Also needs weight loss for high triglycerides Continue Vascepa, make sure he takes this regularly  PLAN:   He will try to bolus consistently with every meal and snack with any carbohydrate content, written instructions given again He will need to check his blood sugars consistently before and after meals including at night He will need to try and change his infusion set on Tuesdays and Saturdays He will follow-up with diabetes educator Needs to be having a review of his diet also   His pump settings were changed in the office today: His 3  PM basal rate of 0.9 will start at 12 noon  Counseling time on subjects discussed in assessment and plan sections is over 50% of today's 25 minute visit   There are no Patient Instructions on file for this visit.  Elayne Snare 05/25/2017, 1:24 PM   Note: This office note was prepared with Dragon voice recognition system technology. Any transcriptional errors that result from this process are unintentional.

## 2017-05-29 ENCOUNTER — Ambulatory Visit: Payer: Medicare Other

## 2017-05-29 ENCOUNTER — Encounter: Payer: Self-pay | Admitting: Family Medicine

## 2017-05-29 ENCOUNTER — Ambulatory Visit: Payer: Medicare Other | Admitting: Family Medicine

## 2017-05-29 ENCOUNTER — Ambulatory Visit (INDEPENDENT_AMBULATORY_CARE_PROVIDER_SITE_OTHER): Payer: Medicare Other | Admitting: Family Medicine

## 2017-05-29 VITALS — BP 148/60 | HR 79 | Temp 97.7°F | Ht 76.0 in | Wt 295.2 lb

## 2017-05-29 DIAGNOSIS — E538 Deficiency of other specified B group vitamins: Secondary | ICD-10-CM | POA: Diagnosis not present

## 2017-05-29 DIAGNOSIS — H6123 Impacted cerumen, bilateral: Secondary | ICD-10-CM | POA: Diagnosis not present

## 2017-05-29 DIAGNOSIS — E1159 Type 2 diabetes mellitus with other circulatory complications: Secondary | ICD-10-CM

## 2017-05-29 DIAGNOSIS — R600 Localized edema: Secondary | ICD-10-CM

## 2017-05-29 DIAGNOSIS — I152 Hypertension secondary to endocrine disorders: Secondary | ICD-10-CM

## 2017-05-29 DIAGNOSIS — I10 Essential (primary) hypertension: Secondary | ICD-10-CM | POA: Diagnosis not present

## 2017-05-29 MED ORDER — CYANOCOBALAMIN 1000 MCG/ML IJ SOLN
1000.0000 ug | Freq: Once | INTRAMUSCULAR | Status: AC
  Administered 2017-05-29: 1000 ug via INTRAMUSCULAR

## 2017-05-29 NOTE — Assessment & Plan Note (Signed)
Discussed lifestyle modifications.  Follow-up at next office visit.

## 2017-05-29 NOTE — Assessment & Plan Note (Signed)
Improved today, however still slightly above goal today, however given his low diastolic pressures, we will not escalate treatment at this time.  He was well controlled a couple weeks ago at his cardiologist visit.  Continue amlodipine, Imdur, Coreg, Cardura, and lisinopril.  Follow-up in 3 months.

## 2017-05-29 NOTE — Progress Notes (Signed)
    Subjective:  David [name] is a 77 y.o. male who presents today with a chief complaint of hypertension follow up.   HPI:  Hypertension, established problem, improving BP Readings from Last 3 Encounters:  05/29/17 (!) 148/60  05/25/17 (!) 156/60  05/12/17 132/60  Current Medications: Amlodipine 10 mg daily, imdur 30mg  daily, Coreg 25 mg twice daily, Cardura 4 mg daily, lisinopril 5 mg daily, compliant without side effects. Interim History: started on amlodipine about a month ago.  He has done well with this no side effects.  No dizziness.  No syncope.  ROS: Denies any chest pain, shortness of breath, dyspnea on exertion, leg edema.   LE Edema, established problem Saw here about a month ago for this.  Recently saw cardiology and was started on twice daily torsemide.  Has noticed a little bit of improvement over the last couple of weeks.  No shortness of breath.  No orthopnea.  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  More recently has noted some hearing loss of the left side.  No obvious precipitating events.  He is concerned that he may have earwax buildup.  ROS: Per HPI  PMH: He reports that he quit smoking about 33 years ago. He has a 60.00 pack-year smoking history. He has quit using smokeless tobacco. He reports that he drinks about 0.6 oz of alcohol per week. He reports that he does not use drugs.  Objective:  Physical Exam: BP (!) 148/60   Pulse 79   Temp 97.7 F (36.5 C) (Oral)   Ht 6\' 4"  (1.93 m)   Wt 295 lb 3.2 oz (133.9 kg)   SpO2 97%   BMI 35.93 kg/m   Gen: NAD, resting comfortably HEENT: Right and left external auditory canal with impacted cerumen. CV: RRR with no murmurs appreciated Pulm: NWOB, CTAB with no crackles, wheezes, or rhonchi  Cerumen successfully irrigated by CMA. TMs visualized without abnormality.   Assessment/Plan:  Hypertension  associated with diabetes ([place]) Improved today, however still slightly above goal today, however given his low diastolic pressures, we will not escalate treatment at this time.  He was well controlled a couple weeks ago at his cardiologist visit.  Continue amlodipine, Imdur, Coreg, Cardura, and lisinopril.  Follow-up in 3 months.  Lower extremity edema Improving.  Weight down about 3 pounds.  Continue current diuretic regimen.  Morbid obesity ([place]) Discussed lifestyle modifications.  Follow-up at next office visit.  B12 Deficiency Injection given today.   Cerumen impaction Irrigated today.  Advised home use of Debrox and/or hydrogen peroxide to help keep the wax soft.  [name], MD 05/29/2017 2:04 PM

## 2017-05-29 NOTE — Assessment & Plan Note (Signed)
Improving.  Weight down about 3 pounds.  Continue current diuretic regimen.

## 2017-05-29 NOTE — Patient Instructions (Addendum)
No medication changes today.  Your blood pressure looks much better.  Come back to see me in 3-6 months, or sooner as needed.  Take care, Dr [name]

## 2017-06-01 ENCOUNTER — Other Ambulatory Visit: Payer: Self-pay | Admitting: Endocrinology

## 2017-06-06 ENCOUNTER — Encounter: Payer: Medicare Other | Attending: Endocrinology | Admitting: Nutrition

## 2017-06-06 ENCOUNTER — Other Ambulatory Visit: Payer: Self-pay

## 2017-06-06 DIAGNOSIS — E1149 Type 2 diabetes mellitus with other diabetic neurological complication: Secondary | ICD-10-CM

## 2017-06-06 DIAGNOSIS — IMO0002 Reserved for concepts with insufficient information to code with codable children: Secondary | ICD-10-CM

## 2017-06-06 DIAGNOSIS — Z713 Dietary counseling and surveillance: Secondary | ICD-10-CM | POA: Insufficient documentation

## 2017-06-06 DIAGNOSIS — Z794 Long term (current) use of insulin: Secondary | ICD-10-CM | POA: Diagnosis not present

## 2017-06-06 DIAGNOSIS — E1165 Type 2 diabetes mellitus with hyperglycemia: Secondary | ICD-10-CM | POA: Diagnosis not present

## 2017-06-06 MED ORDER — INSULIN REGULAR HUMAN (CONC) 500 UNIT/ML ~~LOC~~ SOLN: SUBCUTANEOUS | 4 refills | Status: DC

## 2017-06-08 NOTE — Progress Notes (Signed)
Discussed with patient the need to change infusion sets at least twice a week.  He reports that he does not have financial need, and I told him that this alone will drop his HgbA1C by 1 point.  He also admits that he is still not bolusing for all snacks during the day, and especially at night.   Suggested he not eat, unless bolusing before!.  Stressed that if he just did this, he would have better post prandial reading, giving him more energy and not feeling so tired!.  He agreed to try harder and to change his infusion sets at least twice a week.   I will call him in 2 weeks as a reminderfor this.  He reports that he does not need education on changing infusion sets, or on how to count carbs.  He does not do this, and was told to start back, making the insulin dose more appropriate to what he is eating.  Discussed amounts of carbs in his late night snacking, and he reported good understanding of this.

## 2017-06-08 NOTE — Patient Instructions (Signed)
Change infusion set twice a week before breakfast. Give insulin for all foods eaten, unless treating a low blood sugar.

## 2017-06-13 ENCOUNTER — Ambulatory Visit: Payer: Medicare Other | Admitting: Sports Medicine

## 2017-06-20 ENCOUNTER — Telehealth: Payer: Self-pay | Admitting: *Deleted

## 2017-06-20 ENCOUNTER — Ambulatory Visit (INDEPENDENT_AMBULATORY_CARE_PROVIDER_SITE_OTHER): Payer: Medicare Other | Admitting: Sports Medicine

## 2017-06-20 ENCOUNTER — Encounter: Payer: Self-pay | Admitting: Sports Medicine

## 2017-06-20 DIAGNOSIS — R600 Localized edema: Secondary | ICD-10-CM

## 2017-06-20 DIAGNOSIS — M79675 Pain in left toe(s): Secondary | ICD-10-CM | POA: Diagnosis not present

## 2017-06-20 DIAGNOSIS — R609 Edema, unspecified: Secondary | ICD-10-CM

## 2017-06-20 DIAGNOSIS — Z899 Acquired absence of limb, unspecified: Secondary | ICD-10-CM

## 2017-06-20 DIAGNOSIS — B351 Tinea unguium: Secondary | ICD-10-CM | POA: Diagnosis not present

## 2017-06-20 DIAGNOSIS — M79674 Pain in right toe(s): Secondary | ICD-10-CM | POA: Diagnosis not present

## 2017-06-20 DIAGNOSIS — E0842 Diabetes mellitus due to underlying condition with diabetic polyneuropathy: Secondary | ICD-10-CM

## 2017-06-20 NOTE — Telephone Encounter (Signed)
Faxed orders for compression hose to [facility].

## 2017-06-20 NOTE — Progress Notes (Signed)
Subjective: David [name] is a 77 y.o. male patient seen in office for follow up evaluation of ulceration of the left 3rd toe that remains healed and for nail trim. Patient has a history of diabetes and a blood glucose "good" 127 this morning, A1c 8.1. Denies nausea/vomitting/fever/chills/night sweats or any constitutional symptoms. Patient has no other pedal complaints at this time.  Patient Active Problem List   Diagnosis Date Noted  . Lower extremity edema 04/21/2017  . Anxiety 03/22/2017  . Acute cholecystitis s/p lap cholecystectomy 03/05/2017 03/04/2017  . HOCM (hypertrophic obstructive cardiomyopathy) ([place]) 03/04/2017  . 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 Fatigue 01/27/2017  . Low vitamin B12 level 01/27/2017  . Chronic ulcer of right foot ([place]) 08/09/2016  . Syncope 07/24/2015  . 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 OSA on CPAP 10/26/2014  . Back pain 10/16/2014  . Left leg numbness 10/16/2014  . Episodic lightheadedness 08/04/2014  . Open toe wound 07/18/2014  . Diabetes mellitus with neurological manifestations, uncontrolled ([place]) 02/27/2014  . Seasonal and perennial allergic rhinitis 10/25/2013  . Hypertension associated with diabetes ([place]) 10/25/2013  . Hyperlipidemia associated with type 2 diabetes mellitus ([place]) 10/25/2013  . Morbid obesity ([place]) 10/25/2013  . CKD stage 3 due to type 2 diabetes mellitus ([place]) 10/25/2013   Current Outpatient Medications on File Prior to Visit  Medication Sig Dispense Refill  . amLODipine (NORVASC) 10 MG tablet Take 1 tablet (10 mg total) by mouth daily. 90 tablet 3  . aspirin EC 81 MG tablet Take 81 mg by mouth daily.    . calcitRIOL (ROCALTROL) 0.25 MCG capsule Take 0.25 mcg by mouth daily.     . carvedilol (COREG) 25 MG tablet TAKE ONE TABLET BY MOUTH TWICE DAILY WITH A MEAL 90 tablet 1  . cholecalciferol (VITAMIN D) 1000 units tablet Take 1 tablet (1,000 Units  total) by mouth daily. (Patient taking differently: Take 1,000 Units by mouth every evening. ) 30 tablet 1  . CONTOUR NEXT TEST test strip USE AS INSTRUCTED TO CHECK BLOOD SUGAR 6 TIMES PER DAY 200 each 11  . doxazosin (CARDURA) 4 MG tablet TAKE ONE TABLET BY MOUTH ONCE DAILY 90 tablet 0  . hydroxypropyl methylcellulose / hypromellose (ISOPTO TEARS / GONIOVISC) 2.5 % ophthalmic solution Place 1 drop into both eyes as needed for dry eyes.    . hydrOXYzine (ATARAX/VISTARIL) 50 MG tablet Take 1 tablet (50 mg total) 3 (three) times daily as needed by mouth. 30 tablet 1  . Icosapent Ethyl (VASCEPA) 1 g CAPS 2 caps bid 120 capsule 2  . insulin regular human CONCENTRATED (HUMULIN R) 500 UNIT/ML injection USE UP TO 0.5 ML IN INSULIN PUMP daily. 1 vial 1  . insulin regular human CONCENTRATED (HUMULIN R) 500 UNIT/ML injection USE 0.25 ML DAILY OR AS DIRECTED IN INSULIN PUMP E11.65 20 mL 4  . isosorbide mononitrate (IMDUR) 30 MG 24 hr tablet TAKE TWO TABLETS BY MOUTH ONCE DAILY 60 tablet 11  . liraglutide (VICTOZA) 18 MG/3ML SOPN INJECT 0.3MLS (1.8 MG TOTAL) INTO THE SKIN DAILY 9 pen 1  . lisinopril (PRINIVIL,ZESTRIL) 5 MG tablet Take 5 mg by mouth every evening.     . Omega-3 Fatty Acids (FISH OIL) 1000 MG CAPS Take by mouth 2 (two) times daily.    . pantoprazole (PROTONIX) 40 MG tablet TAKE ONE TABLET BY MOUTH ONCE DAILY 90 tablet 1  . sertraline (ZOLOFT) 100 MG tablet Take  100 mg by mouth daily.    Marland Kitchen torsemide (DEMADEX) 20 MG tablet Take 20 mg by mouth 2 (two) times daily.     . traMADol (ULTRAM) 50 MG tablet Take 1-2 tablets (50-100 mg total) by mouth every 6 (six) hours as needed for moderate pain or severe pain. 30 tablet 0   No current facility-administered medications on file prior to visit.    Allergies  Allergen Reactions  . Adhesive [Tape]     blisters    Recent Results (from the past 2160 hour(s))  Basic metabolic panel     Status: Abnormal   Collection Time: 04/28/17  2:36 PM  Result  Value Ref Range   Sodium 139 135 - 145 mEq/L   Potassium 4.4 3.5 - 5.1 mEq/L   Chloride 101 96 - 112 mEq/L   CO2 31 19 - 32 mEq/L   Glucose, Bld 128 (H) 70 - 99 mg/dL   BUN 40 (H) 6 - 23 mg/dL   Creatinine, Ser 2.16 (H) 0.40 - 1.50 mg/dL   Calcium 9.2 8.4 - 10.5 mg/dL   GFR 31.69 (L) >60.00 mL/min  CUP PACEART INCLINIC DEVICE CHECK     Status: None   Collection Time: 05/12/17  9:37 PM  Result Value Ref Range   Date Time Interrogation Session [id number]    Pulse Generator Manufacturer MERM    Pulse Gen Model [id number] Advisa DR MRI    Pulse Gen Serial Number [id number]    Clinic Name [facility]    Implantable Pulse Generator Type Implantable Pulse Generator    Implantable Pulse Generator Implant Date [id number]    Implantable Lead Manufacturer MERM    Implantable Lead Model 5076 CapSureFix Novus    Implantable Lead Serial Number [id number]    Implantable Lead Implant Date [id number]    Implantable Lead Location Detail 1 UNKNOWN    Implantable Lead Location [id number]    Implantable Lead Manufacturer MERM    Implantable Lead Model 5076 CapSureFix Novus    Implantable Lead Serial Number [id number]    Implantable Lead Implant Date [id number]    Implantable Lead Location Detail 1 UNKNOWN    Implantable Lead Location [id number]    Lead Channel Setting Sensing Sensitivity 2.8 mV   Lead Channel Setting Pacing Amplitude 2.5 V   Lead Channel Setting Pacing Pulse Width 0.4 ms   Lead Channel Setting Pacing Amplitude 3.25 V   Lead Channel Impedance Value 570 ohm   Lead Channel Impedance Value 437 ohm   Lead Channel Sensing Intrinsic Amplitude 1.25 mV   Lead Channel Sensing Intrinsic Amplitude 1.75 mV   Lead Channel Pacing Threshold Amplitude 2 V   Lead Channel Pacing Threshold Pulse Width 0.4 ms   Lead Channel Impedance Value 817 ohm   Lead Channel Impedance Value 418 ohm   Lead Channel Sensing Intrinsic Amplitude 21.75 mV   Lead Channel Sensing Intrinsic Amplitude 23.125 mV   Lead  Channel Pacing Threshold Amplitude 1.75 V   Lead Channel Pacing Threshold Pulse Width 0.4 ms   Battery Status OK    Battery Remaining Longevity 78 mo   Battery Voltage 3.00 V   Brady Statistic RA Percent Paced 99.92 %   Brady Statistic RV Percent Paced 0.05 %   Brady Statistic AP VP Percent 0.05 %   Brady Statistic AS VP Percent 0 %   Brady Statistic AP VS Percent 99.88 %   Brady Statistic AS VS Percent 0.08 %   Eval Rhythm SB 41   Lipid panel  Status: Abnormal   Collection Time: 05/23/17  1:32 PM  Result Value Ref Range   Cholesterol 168 0 - 200 mg/dL    Comment: ATP III Classification       Desirable:  < 200 mg/dL               Borderline High:  200 - 239 mg/dL          High:  > = 240 mg/dL   Triglycerides 282.0 (H) 0.0 - 149.0 mg/dL    Comment: Normal:  <150 mg/dLBorderline High:  150 - 199 mg/dL   HDL 22.20 (L) >39.00 mg/dL   VLDL 56.4 (H) 0.0 - 40.0 mg/dL   Total CHOL/HDL Ratio 8     Comment:                Men          Women1/2 Average Risk     3.4          3.3Average Risk          5.0          4.42X Average Risk          9.6          7.13X Average Risk          15.0          11.0                       NonHDL 146.12     Comment: NOTE:  Non-HDL goal should be 30 mg/dL higher than patient's LDL goal (i.e. LDL goal of < 70 mg/dL, would have non-HDL goal of < 100 mg/dL)  Comprehensive metabolic panel     Status: Abnormal   Collection Time: 05/23/17  1:32 PM  Result Value Ref Range   Sodium 138 135 - 145 mEq/L   Potassium 4.6 3.5 - 5.1 mEq/L   Chloride 104 96 - 112 mEq/L   CO2 28 19 - 32 mEq/L   Glucose, Bld 152 (H) 70 - 99 mg/dL   BUN 39 (H) 6 - 23 mg/dL   Creatinine, Ser 2.03 (H) 0.40 - 1.50 mg/dL   Total Bilirubin 0.4 0.2 - 1.2 mg/dL   Alkaline Phosphatase 82 39 - 117 U/L   AST 19 0 - 37 U/L   ALT 21 0 - 53 U/L   Total Protein 6.7 6.0 - 8.3 g/dL   Albumin 4.0 3.5 - 5.2 g/dL   Calcium 9.2 8.4 - 10.5 mg/dL   GFR 34.04 (L) >60.00 mL/min  Hemoglobin A1c     Status:  Abnormal   Collection Time: 05/23/17  1:32 PM  Result Value Ref Range   Hgb A1c MFr Bld 8.1 (H) 4.6 - 6.5 %    Comment: Glycemic Control Guidelines for People with Diabetes:Non Diabetic:  <6%Goal of Therapy: <7%Additional Action Suggested:  >8%   LDL cholesterol, direct     Status: None   Collection Time: 05/23/17  1:32 PM  Result Value Ref Range   Direct LDL 89.0 mg/dL    Comment: Optimal:  <100 mg/dLNear or Above Optimal:  100-129 mg/dLBorderline High:  130-159 mg/dLHigh:  160-189 mg/dLVery High:  >190 mg/dL    Objective: There were no vitals filed for this visit.  General: Patient is awake, alert, oriented x 3 and in no acute distress.  Dermatology: Skin is warm and dry bilateral with a continued healed ulceration present left 3rd toe distal tuft, no erythema,  no edema. No acute signs of infection. Nails are elongated, thick, and mycotic.    Vascular: Dorsalis Pedis pulse = 1/4 Bilateral,  Posterior Tibial pulse = 1/4 Bilateral,  Capillary Fill Time < 5 seconds, trace edema bilateral.   Neurologic: Protective sensation absent to the level of knee using  the 5.07/10g [employer].  Musculosketal:  No Pain with palpation to healed ulceration. No pain with compression to calves bilateral. Hammertoe and history of toe amps on right.  Assessment and Plan:  Problem List Items Addressed This Visit      Other   Lower extremity edema    Other Visit Diagnoses    Pain due to onychomycosis of toenails of both feet    -  Primary   Diabetic polyneuropathy associated with diabetes mellitus due to underlying condition ([place])       History of amputation         -Complete examination performed -Left 3rd toe ulcer remains healed, no dressings needed   -Nails mechanically debrided using sterile chisel blade without incident -Continue with elevation of legs and PCP management of edema  -Patient to return to office in 10-12 weeks for follow up diabetic nail care.   Landis Martins, DPM

## 2017-06-20 NOTE — Telephone Encounter (Signed)
-----   Message from Landis Martins, [place] sent at 06/20/2017  2:42 PM EST ----- Regarding: Fax Rx to [facility] location Rx Compression stockings 46mmHg knee length Dx PVD with edema  Thanks Dr. Cannon Kettle

## 2017-06-21 NOTE — Progress Notes (Signed)
Subjective:   David [name] is a 77 y.o. male who presents for Medicare Annual/Subsequent preventive examination.  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 Josefa Half issues is low blood sugar  Due to this, he is hesitant to change  Is concerned about reducing consumption and losing weight dropping bs Ate a big breakfast when up and hunting in [place]  Discussed retirement  Discussed job or volunteer    Diet Chol 168; HDL 22; LDL 89; David 282 A1c 8.1 - [name] following Diabetic 10/2016 BMI 34.9   Exercise None Tries to walk Will join a gym   VS BP elevated today 140/70   Health Maintenance Due  Topic Date Due  . TETANUS/TDAP  08/21/1959   Had these in the mid [place]  -0.4  ETOH Social Tobacco; quit 85; 60 pack years  AAA noted normal caliber abd aorta; 07/2015  Educated regarding shingrix         Objective:    Vitals: BP (!) 170/60   Pulse 78   Ht 6\' 4"  (1.93 m)   Wt 287 lb (130.2 kg)   SpO2 97%   BMI 34.93 kg/m   Body mass index is 34.93 kg/m.  Advanced Directives 06/22/2017 03/04/2017 03/04/2017 09/26/2016 05/18/2016 07/23/2015 07/05/2015  Does Patient Have a Medical Advance Directive? Yes Yes Yes No Yes Yes No  Type of Advance Directive - [place];Living will [place];Living will - [place];Living will - -  Does patient  want to make changes to medical advance directive? - No - Patient declined - - - - -  Copy of [place] in Chart? - No - copy requested No - copy requested - No - copy requested - -  Would patient like information on creating a medical advance directive? - - - No - Patient declined - - -    Tobacco Social History   Tobacco Use  Smoking Status Former Smoker  . Packs/day: 2.00  . Years: 30.00  . Pack years: 60.00  . Last attempt to quit: 03/03/1984  . Years since quitting: 33.3  Smokeless Tobacco Former Engineer, structural given: Yes   Clinical Intake:    Past Medical History:  Diagnosis Date  . Anemia, iron deficiency   . Anxiety   . Arthritis   . BPH (benign prostatic hypertrophy)   . CAD (coronary artery disease)    Nonobstructive CAD per cath  . Cardiac pacemaker in situ   . CHF (congestive heart failure) ([place])   . Chronic ulcer of right foot ([place])   . CKD (chronic kidney disease), stage III ([place]) secondary to DM and HTN   nephrologist-  [name]  . Dyspnea   . History of cellulitis  right great toe 10-25-2014  . History of skin cancer   . HOCM (hypertrophic obstructive cardiomyopathy) ([place])   . Hypertension   . Insulin dependent type 2 diabetes mellitus ([place]) 1991   followd by [name]--  has insulin pump  . Insulin pump in place   . OSA on CPAP   . Peripheral neuropathy    severe  . Peripheral vascular disease ([place])    bilateral lower extremities  . Rib fracture 07/24/2015  . Secondary hyperparathyroidism of renal origin ([place])   . Sinus node dysfunction [facility])    Past Surgical History:  Procedure Laterality Date  . AMPUTATION OF REPLICATED TOES  Mar 4656   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)  . AMPUTATION TOE Right 03/12/2015   Procedure: RIGHT HALLUS AMPUTATION ;  Surgeon: [name], MD;  Location: [facility];  Service: Podiatry;  Laterality: Right;  . CARDIAC CATHETERIZATION  11-25-2010   [place], [place]    Nonobstructive CAD  . CARDIAC PACEMAKER PLACEMENT  Nov 2009   [employer]  . CHOLECYSTECTOMY N/A 03/05/2017   Procedure: LAPAROSCOPIC CHOLECYSTECTOMY WITH INTRAOPERATIVE CHOLANGIOGRAM;  Surgeon: [name], MD;  Location: [facility];  Service: General;  Laterality: N/A;  . EP IMPLANTABLE DEVICE N/A 06/03/2015   Procedure: PPM Generator Changeout;  Surgeon: [name], MD;  Location: [facility];  Service: Cardiovascular;  Laterality: N/A;  . EXCISION BONE CYST Right 03/06/2015   Procedure: BONE BIOPSIES OF RIGHT FOOT;  Surgeon: [name], MD;  Location: [facility];  Service: Podiatry;  Laterality: Right;  . ORIF ANKLE FRACTURE Left 11/06/2014   Procedure: OPEN REDUCTION INTERNAL FIXATION (ORIF) LEFT  ANKLE FRACTURE;  Surgeon: [name], MD;  Location: [facility];  Service: Orthopedics;  Laterality: Left;  . TOTAL KNEE ARTHROPLASTY    . VEIN LIGATION AND STRIPPING     Family History  Problem Relation Age of Onset  . Cancer Mother        breast  . Heart attack Father    Social History   Socioeconomic History  . Marital status: Married    Spouse name: Not on file  . Number of children: 3  . Years of education: 30  . Highest education level: Not on file  Social Needs  . Financial resource strain: Not on file  . Food insecurity - worry: Not on file  . Food insecurity - inability: Not on file  . Transportation needs - medical: Not on file  . Transportation needs - non-medical: Not on file  Occupational History  . Occupation: Retired  Tobacco Use  . Smoking status: Former Smoker    Packs/day: 2.00    Years: 30.00    Pack years: 60.00    Last attempt to quit: 03/03/1984    Years since quitting: 33.3  . Smokeless tobacco: Former Network engineer and Sexual Activity  . Alcohol use: Yes    Alcohol/week: 0.6 oz    Types: 1 Glasses of wine per week    Comment: social  . Drug use: No  . Sexual activity: Not Currently  Other Topics Concern    . Not on file  Social History Narrative   Currently resides with his wife. 1 dog. Fun/Hobby: Golf    Denies any religious beliefs effecting health care.     Outpatient Encounter Medications as of 06/22/2017  Medication Sig  . amLODipine (NORVASC) 10 MG tablet Take 1 tablet (10 mg total) by mouth daily.  Marland Kitchen aspirin EC 81  MG tablet Take 81 mg by mouth daily.  . calcitRIOL (ROCALTROL) 0.25 MCG capsule Take 0.25 mcg by mouth daily.   . carvedilol (COREG) 25 MG tablet TAKE ONE TABLET BY MOUTH TWICE DAILY WITH A MEAL  . cholecalciferol (VITAMIN D) 1000 units tablet Take 1 tablet (1,000 Units total) by mouth daily. (Patient taking differently: Take 1,000 Units by mouth every evening. )  . CONTOUR NEXT TEST test strip USE AS INSTRUCTED TO CHECK BLOOD SUGAR 6 TIMES PER DAY  . doxazosin (CARDURA) 4 MG tablet TAKE ONE TABLET BY MOUTH ONCE DAILY  . hydroxypropyl methylcellulose / hypromellose (ISOPTO TEARS / GONIOVISC) 2.5 % ophthalmic solution Place 1 drop into both eyes as needed for dry eyes.  . hydrOXYzine (ATARAX/VISTARIL) 50 MG tablet Take 1 tablet (50 mg total) 3 (three) times daily as needed by mouth.  Vanessa Kick Ethyl (VASCEPA) 1 g CAPS 2 caps bid  . insulin regular human CONCENTRATED (HUMULIN R) 500 UNIT/ML injection USE UP TO 0.5 ML IN INSULIN PUMP daily.  . insulin regular human CONCENTRATED (HUMULIN R) 500 UNIT/ML injection USE 0.25 ML DAILY OR AS DIRECTED IN INSULIN PUMP E11.65  . isosorbide mononitrate (IMDUR) 30 MG 24 hr tablet TAKE TWO TABLETS BY MOUTH ONCE DAILY  . liraglutide (VICTOZA) 18 MG/3ML SOPN INJECT 0.3MLS (1.8 MG TOTAL) INTO THE SKIN DAILY  . lisinopril (PRINIVIL,ZESTRIL) 5 MG tablet Take 5 mg by mouth every evening.   . Omega-3 Fatty Acids (FISH OIL) 1000 MG CAPS Take by mouth 2 (two) times daily.  . pantoprazole (PROTONIX) 40 MG tablet TAKE ONE TABLET BY MOUTH ONCE DAILY  . sertraline (ZOLOFT) 100 MG tablet Take 100 mg by mouth daily.  Marland Kitchen torsemide (DEMADEX) 20 MG tablet  Take 20 mg by mouth 2 (two) times daily.   . traMADol (ULTRAM) 50 MG tablet Take 1-2 tablets (50-100 mg total) by mouth every 6 (six) hours as needed for moderate pain or severe pain.   No facility-administered encounter medications on file as of 06/22/2017.     Activities of Daily Living In your present state of health, do you have any difficulty performing the following activities: 06/22/2017 03/04/2017  Hearing? N -  Comment hearing aids -  Vision? N -  Difficulty concentrating or making decisions? N -  Walking or climbing stairs? Y -  Dressing or bathing? N -  Doing errands, shopping? N N  Preparing Food and eating ? N -  Using the Toilet? N -  In the past six months, have you accidently leaked urine? Y -  Comment some leakage -  Do you have problems with loss of bowel control? N -  Managing your Medications? N -  Managing your Finances? N -  Housekeeping or managing your Housekeeping? N -  Some recent data might be hidden    Patient Care Team: [name], MD as PCP - General (Family Medicine) [name], MD as Consulting Physician (Podiatry) [name], MD as Consulting Physician (Nephrology) Elayne Snare, MD as Consulting Physician (Endocrinology) [name], MD as Consulting Physician (Cardiology)   Assessment:   This is a routine wellness examination for [place].  Exercise Activities and Dietary recommendations    Goals    . Patient Stated     Commit to doing something you enjoy Goals to join a health club where you ease back into some form of exercise  Plan a trip to the mountain for enjoyment     . Patient Stated  Will go over to the [employer] to inquire about volunteering or part time     . Weight (lb) < 275 lb (124.7 kg)       Fall Risk Fall Risk  06/22/2017 01/27/2017 05/18/2016 07/20/2015 06/08/2015  Falls in the past year? No Yes No Yes No  Comment fell one time last year; go tup fast and had syncopal episode  - - - -    Number falls in past yr: - 1 - 2 or more -  Injury with Fall? - No - Yes -  Comment - - - hit the back of head but does not c/o vision changes or HA but sore ribs present on left side -  Risk for fall due to : - - - Other (Comment) -  Risk for fall due to: Comment - - - just got dizzy and blackout was stitting in the sun for a period of time -  Follow up - Falls evaluation completed - - -     Depression Screen PHQ 2/9 Scores 06/22/2017 05/18/2016 06/08/2015 05/04/2015  PHQ - 2 Score 0 0 0 0    Cognitive Function MMSE - Mini Mental State Exam 05/18/2016  Orientation to time 5  Orientation to Place 5  Registration 3  Attention/ Calculation 5  Recall 3  Language- name 2 objects 2  Language- repeat 1  Language- follow 3 step command 3  Language- read & follow direction 1  Write a sentence 1  Copy design 1  Total score 30        Immunization History  Administered Date(s) Administered  . Influenza, High Dose Seasonal PF 02/09/2017  . Influenza,inj,Quad PF,6+ Mos 01/30/2014  . Influenza-Unspecified 03/17/2015, 04/15/2016      Screening Tests Health Maintenance  Topic Date Due  . TETANUS/TDAP  08/21/1959  . PNA vac Low Risk Adult (1 of 2 - PCV13) 06/22/2018 (Originally 08/20/2005)  . FOOT EXAM  08/10/2017  . OPHTHALMOLOGY EXAM  10/21/2017  . INFLUENZA VACCINE  Completed       Plan:      PCP Notes   Health Maintenance  Educated regarding Tdap; agreed to get this at the pharmacy Will check with his wife to see if he had both Pneumonia vaccinations at the prior doctor in [place]. Feels he did but not sure. May need prevnar and did discuss this  Educated regarding the shingrix   Abnormal Screens  none   Referrals  [facility]   Patient concerns; Spent 20 minutes discussing the need to have 3 regular meals a day. Noted that he did get up early, eat a big breakfast and go hunting. Has had multiple medical issues in the last couple of years. Having  some adjustment issues to retirement and misses work, misses his hunting dogs which he left in [place].  Discussed plan;  Agreed to go by the [place] center to try and volunteer. States this would help him get up and stay up and take better are of his diabetes. Has some fears of low BS and agreed to go to the [facility] center just to get up to date on his diabetes or see [name] about a dietician to help him through these fears.     Nurse Concerns; As noted    Next PCP apt November 27, 2017    I have personally reviewed and noted the following in the patient's chart:   . Medical and social history . Use of  alcohol, tobacco or illicit drugs  . Current medications and supplements . Functional ability and status . Nutritional status . Physical activity . Advanced directives . List of other physicians . Hospitalizations, surgeries, and ER visits in previous 12 months . Vitals . Screenings to include cognitive, depression, and falls . Referrals and appointments  In addition, I have reviewed and discussed with patient certain preventive protocols, quality metrics, and best practice recommendations. A written personalized care plan for preventive services as well as general preventive health recommendations were provided to patient.     Wynetta Fines, RN  06/22/2017

## 2017-06-22 ENCOUNTER — Encounter: Payer: Self-pay | Admitting: *Deleted

## 2017-06-22 ENCOUNTER — Ambulatory Visit: Payer: Medicare Other | Admitting: *Deleted

## 2017-06-22 ENCOUNTER — Other Ambulatory Visit: Payer: Medicare Other

## 2017-06-22 VITALS — BP 138/70 | HR 78 | Ht 76.0 in | Wt 287.0 lb

## 2017-06-22 DIAGNOSIS — E119 Type 2 diabetes mellitus without complications: Secondary | ICD-10-CM

## 2017-06-22 DIAGNOSIS — Z794 Long term (current) use of insulin: Principal | ICD-10-CM

## 2017-06-22 NOTE — Patient Instructions (Addendum)
[name] , Thank you for taking time to come for your Medicare Wellness Visit. I appreciate your ongoing commitment to your health goals. Please review the following plan we discussed and let me know if I can assist you in the future.   I will referral to the [facility] and they will call   A Tetanus is recommended every 10 years. Medicare covers a tetanus if you have a cut or wound; otherwise, there may be a charge. If you had not had a tetanus with pertusses, known as the Tdap, you can take this anytime.   The Centers for Disease Control are now recommending 2 pneumonia vaccinations after 41. The first is the Prevnar 13. This helps to boost your immunity to community acquired pneumonia as well as some protection from bacterial pneumonia  The 2nd is the pneumovax 23, which offers more broad protection!  Please consider taking these as this is your best protection against pneumonia.   Will ask your wife if you had both pneumonia vaccinations  Shingrix is a vaccine for the prevention of Shingles in Adults 37 and older.  If you are on Medicare, you can request a prescription from your doctor to be filled at a pharmacy.  Please check with your benefits regarding applicable copays or out of pocket expenses.  The Shingrix is given in 2 vaccines approx 8 weeks apart. You must receive the 2nd dose prior to 6 months from receipt of the first.    Problems with memory are Ad8 score reviewed for issues:  Issues making decisions:  Less interest in hobbies / activities:  Repeats questions, stories (family complaining):  Trouble using ordinary gadgets (microwave, computer, phone):  Forgets the month or year:   Mismanaging finances:   Remembering appts:  Daily problems with thinking and/or memory:    These are the goals we discussed: Goals    . Patient Stated     Commit to doing something you enjoy Goals to join a health club where you ease back into some  form of exercise  Plan a trip to the mountain for enjoyment     . Patient Stated     Will go over to the [employer] to inquire about volunteering or part time     . Weight (lb) < 275 lb (124.7 kg)       This is a list of the screening recommended for you and due dates:  Health Maintenance  Topic Date Due  . Tetanus Vaccine  08/21/1959  . Pneumonia vaccines (1 of 2 - PCV13) 06/22/2018*  . Complete foot exam   08/10/2017  . Eye exam for diabetics  10/21/2017  . Flu Shot  Completed  *Topic was postponed. The date shown is not the original due date.   Falls can be the main reason people lose their independence Think before you CLIMB  4 things you can do to prevent falls 1. Begin an exercise program to improve your leg strength and balance 2. Ask the doctor to review medicines for fall risk 3. Get an annual eye check up and update your eye glasses;  (the [employer] still assist with eyewear:  Reviewed for annual vision exam;The [facility] assistance for eyewear is coordinated through [facility]; Please call Cherlyn Labella at [phone] 4. Make your home safer by: Removing clutter and tripping hazzards Putting railing on stairs and adding grab bars to the bathroom  Have good lighting; especially on stairs and at night when getting  up to the bathroom   Exercise; including walking can assist with maintaining tone and balance and help prevent falls as you age.     Health Maintenance, Male A healthy lifestyle and preventive care is important for your health and wellness. Ask your health care provider about what schedule of regular examinations is right for you. What should I know about weight and diet? Eat a Healthy Diet  Eat plenty of vegetables, fruits, whole grains, low-fat dairy products, and lean protein.  Do not eat a lot of foods high in solid fats, added sugars, or salt.  Maintain a Healthy Weight Regular exercise can help you achieve or maintain a healthy weight.  You should:  Do at least 150 minutes of exercise each week. The exercise should increase your heart rate and make you sweat (moderate-intensity exercise).  Do strength-training exercises at least twice a week.  Watch Your Levels of Cholesterol and Blood Lipids  Have your blood tested for lipids and cholesterol every 5 years starting at 77 years of age. If you are at high risk for heart disease, you should start having your blood tested when you are 77 years old. You may need to have your cholesterol levels checked more often if: ? Your lipid or cholesterol levels are high. ? You are older than 77 years of age. ? You are at high risk for heart disease.  What should I know about cancer screening? Many types of cancers can be detected early and may often be prevented. Lung Cancer  You should be screened every year for lung cancer if: ? You are a current smoker who has smoked for at least 30 years. ? You are a former smoker who has quit within the past 15 years.  Talk to your health care provider about your screening options, when you should start screening, and how often you should be screened.  Colorectal Cancer  Routine colorectal cancer screening usually begins at 77 years of age and should be repeated every 5-10 years until you are 77 years old. You may need to be screened more often if early forms of precancerous polyps or small growths are found. Your health care provider may recommend screening at an earlier age if you have risk factors for colon cancer.  Your health care provider may recommend using home test kits to check for hidden blood in the stool.  A small camera at the end of a tube can be used to examine your colon (sigmoidoscopy or colonoscopy). This checks for the earliest forms of colorectal cancer.  Prostate and Testicular Cancer  Depending on your age and overall health, your health care provider may do certain tests to screen for prostate and testicular  cancer.  Talk to your health care provider about any symptoms or concerns you have about testicular or prostate cancer.  Skin Cancer  Check your skin from head to toe regularly.  Tell your health care provider about any new moles or changes in moles, especially if: ? There is a change in a mole's size, shape, or color. ? You have a mole that is larger than a pencil eraser.  Always use sunscreen. Apply sunscreen liberally and repeat throughout the day.  Protect yourself by wearing long sleeves, pants, a wide-brimmed hat, and sunglasses when outside.  What should I know about heart disease, diabetes, and high blood pressure?  If you are 26-40 years of age, have your blood pressure checked every 3-5 years. If you are 101 years of age  or older, have your blood pressure checked every year. You should have your blood pressure measured twice-once when you are at a hospital or clinic, and once when you are not at a hospital or clinic. Record the average of the two measurements. To check your blood pressure when you are not at a hospital or clinic, you can use: ? An automated blood pressure machine at a pharmacy. ? A home blood pressure monitor.  Talk to your health care provider about your target blood pressure.  If you are between 19-71 years old, ask your health care provider if you should take aspirin to prevent heart disease.  Have regular diabetes screenings by checking your fasting blood sugar level. ? If you are at a normal weight and have a low risk for diabetes, have this test once every three years after the age of 33. ? If you are overweight and have a high risk for diabetes, consider being tested at a younger age or more often.  A one-time screening for abdominal aortic aneurysm (AAA) by ultrasound is recommended for men aged 70-75 years who are current or former smokers. What should I know about preventing infection? Hepatitis B If you have a higher risk for hepatitis B, you  should be screened for this virus. Talk with your health care provider to find out if you are at risk for hepatitis B infection. Hepatitis C Blood testing is recommended for:  Everyone born from [birth year] through 1965.  Anyone with known risk factors for hepatitis C.  Sexually Transmitted Diseases (STDs)  You should be screened each year for STDs including gonorrhea and chlamydia if: ? You are sexually active and are younger than 77 years of age. ? You are older than 77 years of age and your health care provider tells you that you are at risk for this type of infection. ? Your sexual activity has changed since you were last screened and you are at an increased risk for chlamydia or gonorrhea. Ask your health care provider if you are at risk.  Talk with your health care provider about whether you are at high risk of being infected with HIV. Your health care provider may recommend a prescription medicine to help prevent HIV infection.  What else can I do?  Schedule regular health, dental, and eye exams.  Stay current with your vaccines (immunizations).  Do not use any tobacco products, such as cigarettes, chewing tobacco, and e-cigarettes. If you need help quitting, ask your health care provider.  Limit alcohol intake to no more than 2 drinks per day. One drink equals 12 ounces of beer, 5 ounces of wine, or 1 ounces of hard liquor.  Do not use street drugs.  Do not share needles.  Ask your health care provider for help if you need support or information about quitting drugs.  Tell your health care provider if you often feel depressed.  Tell your health care provider if you have ever been abused or do not feel safe at home. This information is not intended to replace advice given to you by your health care provider. Make sure you discuss any questions you have with your health care provider. Document Released: 10/29/2007 Document Revised: 12/30/2015 Document Reviewed:  02/03/2015 Elsevier Interactive Patient Education  [employer].

## 2017-06-29 ENCOUNTER — Ambulatory Visit: Payer: Medicare Other | Admitting: Family Medicine

## 2017-06-29 ENCOUNTER — Ambulatory Visit (INDEPENDENT_AMBULATORY_CARE_PROVIDER_SITE_OTHER): Payer: Medicare Other | Admitting: Family Medicine

## 2017-06-29 ENCOUNTER — Ambulatory Visit (INDEPENDENT_AMBULATORY_CARE_PROVIDER_SITE_OTHER): Payer: Medicare Other

## 2017-06-29 ENCOUNTER — Encounter: Payer: Self-pay | Admitting: Family Medicine

## 2017-06-29 VITALS — BP 146/60 | HR 78 | Wt 281.2 lb

## 2017-06-29 DIAGNOSIS — R0600 Dyspnea, unspecified: Secondary | ICD-10-CM | POA: Diagnosis not present

## 2017-06-29 DIAGNOSIS — Z794 Long term (current) use of insulin: Secondary | ICD-10-CM

## 2017-06-29 DIAGNOSIS — E119 Type 2 diabetes mellitus without complications: Secondary | ICD-10-CM | POA: Diagnosis not present

## 2017-06-29 DIAGNOSIS — R918 Other nonspecific abnormal finding of lung field: Secondary | ICD-10-CM | POA: Diagnosis not present

## 2017-06-29 DIAGNOSIS — IMO0001 Reserved for inherently not codable concepts without codable children: Secondary | ICD-10-CM

## 2017-06-29 LAB — COMPREHENSIVE METABOLIC PANEL
ALT: 22 U/L (ref 0–53)
AST: 19 U/L (ref 0–37)
Albumin: 3.8 g/dL (ref 3.5–5.2)
Alkaline Phosphatase: 101 U/L (ref 39–117)
BUN: 42 mg/dL — ABNORMAL HIGH (ref 6–23)
CO2: 26 mEq/L (ref 19–32)
Calcium: 9.1 mg/dL (ref 8.4–10.5)
Chloride: 103 mEq/L (ref 96–112)
Creatinine, Ser: 2.13 mg/dL — ABNORMAL HIGH (ref 0.40–1.50)
GFR: 32.19 mL/min — ABNORMAL LOW (ref >60.00)
Glucose, Bld: 315 mg/dL — ABNORMAL HIGH (ref 70–99)
Potassium: 4.6 mEq/L (ref 3.5–5.1)
Sodium: 137 mEq/L (ref 135–145)
Total Bilirubin: 0.6 mg/dL (ref 0.2–1.2)
Total Protein: 6.9 g/dL (ref 6.0–8.3)

## 2017-06-29 LAB — CBC
HCT: 35.7 % — ABNORMAL LOW (ref 39.0–52.0)
Hemoglobin: 11.9 g/dL — ABNORMAL LOW (ref 13.0–17.0)
MCHC: 33.3 g/dL (ref 30.0–36.0)
MCV: 91.1 fl (ref 78.0–100.0)
Platelets: 207 10*3/uL (ref 150.0–400.0)
RBC: 3.92 Mil/uL — ABNORMAL LOW (ref 4.22–5.81)
RDW: 15.6 % — ABNORMAL HIGH (ref 11.5–15.5)
WBC: 8.2 10*3/uL (ref 4.0–10.5)

## 2017-06-29 LAB — GLUCOSE, POCT (MANUAL RESULT ENTRY): POC Glucose: 262 mg/dl — AB (ref 70–99)

## 2017-06-29 MED ORDER — DOXYCYCLINE HYCLATE 100 MG PO TABS: 100.0000 mg | ORAL_TABLET | Freq: Two times a day (BID) | ORAL | 0 refills | Status: DC

## 2017-06-29 NOTE — Patient Instructions (Addendum)
Please start the doxycycline.  We will await the results of your blood test.  Please come back soon after your symptoms improved for your physical exam.  Please let me know if your symptoms worsen or not improving.  Take care, Dr. [name]

## 2017-06-29 NOTE — Progress Notes (Signed)
    Subjective:  David [name] is a 77 y.o. male who presents today for same-day appointment with a chief complaint of dyspnea.   HPI:  Dyspnea, Acute Issue Symptoms started yesterday.  Worsened over that time.  Associated with cough for about a week.  No fevers.  No worsening lower extremity swelling.  No weight gain.  His wife was concerned that it may be related to anxiety and gave him 3 total doses of his hydroxyzine yesterday.  This morning she was concerned because he is very difficult to arouse.  She is also concerned because his insulin pump was turned off this morning.  He feels better regarding his somnolence as of now, however still has persistent shortness of breath.  He is able to lay flat on his back without feeling short of breath.  No chest pain.  No dizziness.  No syncope.  No medication changes.  Has been compliant with all his medications.  No obvious alleviating or aggravating factors.   ROS: Per HPI  PMH: He reports that he quit smoking about 33 years ago. He has a 60.00 pack-year smoking history. He has quit using smokeless tobacco. He reports that he drinks about 0.6 oz of alcohol per week. He reports that he does not use drugs.  Objective:  Physical Exam: BP (!) 146/60 (BP Location: Left Arm, Patient Position: Sitting, Cuff Size: Large)   Pulse 78   Wt 281 lb 3.2 oz (127.6 kg)   SpO2 95%   BMI 34.23 kg/m   Wt Readings from Last 3 Encounters:  06/29/17 281 lb 3.2 oz (127.6 kg)  06/22/17 287 lb (130.2 kg)  05/29/17 295 lb 3.2 oz (133.9 kg)   Gen: NAD, resting comfortably CV: RRR with no murmurs appreciated Pulm: NWOB, CTAB with no crackles, wheezes, or rhonchi Extremities: 1+ pitting edema to mid tibia bilaterally.  This is stable from prior exams.  Results for orders placed or performed in visit on 06/29/17 (from the past 24 hour(s))  POCT glucose (manual entry)     Status: Abnormal   Collection Time: 06/29/17 11:01 AM  Result Value Ref Range   POC  Glucose 262 (A) 70 - 99 mg/dl   Assessment/Plan:  Dyspnea Patient has had URI symptoms for about a week.  His lung exam is relatively stable however given that he has had a cough and with other URI symptoms for greater than 7 days, we will empirically treat with a course of doxycycline.  He does not have any PND or orthopnea.  His weight is stable and his lower extremity swelling is stable.  Do not think he has a significant amount of volume overload contributing to his symptoms at this point.  His well score is 0-does not need further evaluation at this point.  I do not see any signs of obvious pneumonia or volume overload on his chest x-ray-we will await radiology read.  No chest pain or exertional symptoms to suggest cardiac etiology.  We will check CBC and CMET to rule out anemia or other metabolic derangements.  Symptoms not improving despite above and negative workup, may need advanced imaging to rule out PE or other pulmonary pathology.  Strict return precautions reviewed.  Somnolence Likely due to hydroxyzine.  He is now at his normal baseline cognitive function.  No signs of somnolence or drowsiness.  Discussed proper use of hydroxyzine and advised against use of more than 1-2 doses per daily.  [name], MD 06/29/2017 12:51 PM

## 2017-07-05 DIAGNOSIS — I129 Hypertensive chronic kidney disease with stage 1 through stage 4 chronic kidney disease, or unspecified chronic kidney disease: Secondary | ICD-10-CM | POA: Diagnosis not present

## 2017-07-05 DIAGNOSIS — E1122 Type 2 diabetes mellitus with diabetic chronic kidney disease: Secondary | ICD-10-CM | POA: Diagnosis not present

## 2017-07-05 DIAGNOSIS — N183 Chronic kidney disease, stage 3 (moderate): Secondary | ICD-10-CM | POA: Diagnosis not present

## 2017-07-05 DIAGNOSIS — N2581 Secondary hyperparathyroidism of renal origin: Secondary | ICD-10-CM | POA: Diagnosis not present

## 2017-07-05 DIAGNOSIS — D509 Iron deficiency anemia, unspecified: Secondary | ICD-10-CM | POA: Diagnosis not present

## 2017-07-21 ENCOUNTER — Other Ambulatory Visit (INDEPENDENT_AMBULATORY_CARE_PROVIDER_SITE_OTHER): Payer: Medicare Other

## 2017-07-21 DIAGNOSIS — E782 Mixed hyperlipidemia: Secondary | ICD-10-CM | POA: Diagnosis not present

## 2017-07-21 DIAGNOSIS — Z794 Long term (current) use of insulin: Secondary | ICD-10-CM | POA: Diagnosis not present

## 2017-07-21 DIAGNOSIS — E1165 Type 2 diabetes mellitus with hyperglycemia: Secondary | ICD-10-CM

## 2017-07-21 LAB — LIPID PANEL
Cholesterol: 175 mg/dL (ref 0–200)
HDL: 25.1 mg/dL — ABNORMAL LOW (ref >39.00)
NonHDL: 150.02
Total CHOL/HDL Ratio: 7
Triglycerides: 259 mg/dL — ABNORMAL HIGH (ref 0.0–149.0)
VLDL: 51.8 mg/dL — ABNORMAL HIGH (ref 0.0–40.0)

## 2017-07-21 LAB — GLUCOSE, RANDOM: Glucose, Bld: 278 mg/dL — ABNORMAL HIGH (ref 70–99)

## 2017-07-21 LAB — LDL CHOLESTEROL, DIRECT: Direct LDL: 76 mg/dL

## 2017-07-22 LAB — FRUCTOSAMINE: Fructosamine: 315 umol/L — ABNORMAL HIGH (ref 0–285)

## 2017-07-23 ENCOUNTER — Other Ambulatory Visit: Payer: Self-pay | Admitting: Family

## 2017-07-23 ENCOUNTER — Other Ambulatory Visit: Payer: Self-pay | Admitting: Endocrinology

## 2017-07-23 NOTE — Progress Notes (Signed)
Patient ID: David [name], male   DOB: 1941/03/26, 77 y.o.   MRN: [id number]    Reason for Appointment: Followup for Type 2 Diabetes  History of Present Illness:          Diagnosis: Type 2 diabetes mellitus, date of diagnosis:   1992       Past history:  He was initially treated with metformin and at some point also glipizide. His previous records from out of town are not available and no information is available about his level of control Apparently he was started on insulin in 1994 approximately because of poor control He has been on various insulin regimens over the last several years However even with insulin he has had poor control for at least the last 7 or 8 years. He does not know what his previous A1c levels have been. He had been continued on metformin and glipizide but metformin stopped about 2 years ago because of kidney function abnormality He had been taking Lantus 60 units twice a day with NovoLog previously and also Before his initial consultation in 6/15 he was on NovoLog twice a day and Humalog mix insulin  Because of poor control and large insulin doses he was started on Victoza in 6/15  Recent history:   INSULIN PUMP  regimen with U-500 insulin is as follows BASAL rates:Midnight = 0.8 ,6 AM = 1.1, 12 PM = 0.9 and 9 PM = 0.7    I/C ratio: 20, ISF: 45, target 120-150.   Since 04/28/14 he has been on a Medtronic insulin pump because of persistent poor control and high insulin requirement  Initially with the pump his blood sugars had been significantly better but subsequently blood sugars have been inadequately controlled Has had A1c's of mostly over 8%  Fructosamine is mild increased at 315   Current management, problems and blood sugar patterns:  He was instructed on day-to-day management by educator and also given written instructions on the last visit for improving his pump management  However he has not made any changes and he does not  remember the discussion we had previously  Currently he is not having his wife help him with compliance or day-to-day management and she has not been accompanying him to the office  He is still changing his infusion set only when the insulin runs out and not twice a week as recommended  He is also again not bolusing for snacks in between meals or after dinner; he is now eating a bowl of cereal sometime after evening meal but there are no boluses after evening meal  Also is not checking blood sugars AFTER his meals and usually not able to assess postprandial blood sugars  He only had one HYPOGLYCEMIC episode after breakfast with a glucose of 69; this resulted in a rebound for the next day  However his FASTING blood sugars are consistently high compared to before and not clear why  Blood sugars BEFORE dinnertime are variable and sometimes as low as 91  Not clear if his blood sugars are controlled with his suppertime boluses since he does not check them after eating  Also even though he thinks he is eating small portions he may still have high readings in the evening before dinner  Still has difficulty losing weight  He is not comfortable changing his own settings on his own  He is doing 1.8 mg Victoza injections daily in am         Oral hypoglycemic  drugs the patient is taking are: None, has renal dysfunction        Glucose monitoring:  done about 3 times a day         Glucometer:  contour     Blood Glucose readings from pump download:  Mean values apply above for all meters except median for One Touch  PRE-MEAL Fasting Lunch Dinner Bedtime Overall  Glucose range:  183-322   92-297    Mean/median:   189  187  185   POST-MEAL PC Breakfast PC Lunch PC Dinner  Glucose range:     Mean/median:  144     Previous readings:  Mean values apply above for all meters except median for One Touch  PRE-MEAL Fasting Lunch Dinner Bedtime Overall  Glucose range: 91-254  70-258       Mean/median: ?  141   209   147     Self-care:   Meals: 2- 3 meals per day. May skip breakfast otherwise breakfast is only a toast.  Will have half sandwich with soup at 1 pm , dinner at  6-7 pm.    He thinks his portions are small but periodically eats sweets.   Bedtime snack is usually crackers  with milk or fruit         Exercise:  Walking some   Last  consultation : Most recent: several years ago.       Wt Readings from Last 3 Encounters:  07/24/17 289 lb 6.4 oz (131.3 kg)  06/29/17 281 lb 3.2 oz (127.6 kg)  06/22/17 287 lb (130.2 kg)   Glycemic control:   Lab Results  Component Value Date   HGBA1C 8.1 (H) 05/23/2017   HGBA1C 7.8 (H) 03/05/2017   HGBA1C 8.2 (H) 02/17/2017   Lab Results  Component Value Date   MICROALBUR 7.7 (H) 10/07/2016   LDLCALC 94 02/17/2017   CREATININE 2.13 (H) 06/29/2017            Lab Results  Component Value Date   FRUCTOSAMINE 315 (H) 07/21/2017   FRUCTOSAMINE 328 (H) 08/05/2016   FRUCTOSAMINE 326 (H) 01/30/2014     Other active problems: See review of systems   Allergies as of 07/24/2017      Reactions   Adhesive [tape]    blisters      Medication List        Accurate as of 07/24/17  2:24 PM. Always use your most recent med list.          amLODipine 10 MG tablet Commonly known as:  NORVASC Take 1 tablet (10 mg total) by mouth daily.   aspirin EC 81 MG tablet Take 81 mg by mouth daily.   calcitRIOL 0.25 MCG capsule Commonly known as:  ROCALTROL Take 0.25 mcg by mouth daily.   carvedilol 25 MG tablet Commonly known as:  COREG TAKE ONE TABLET BY MOUTH TWICE DAILY WITH A MEAL   cholecalciferol 1000 units tablet Commonly known as:  VITAMIN D Take 1 tablet (1,000 Units total) by mouth daily.   CONTOUR NEXT TEST test strip Generic drug:  glucose blood USE AS INSTRUCTED TO CHECK BLOOD SUGAR 6 TIMES PER DAY   doxazosin 4 MG tablet Commonly known as:  CARDURA TAKE 1 TABLET BY MOUTH ONCE DAILY   doxycycline 100  MG tablet Commonly known as:  VIBRA-TABS Take 1 tablet (100 mg total) by mouth 2 (two) times daily.   Fish Oil 1000 MG Caps Take by mouth 2 (two) times daily.  hydroxypropyl methylcellulose / hypromellose 2.5 % ophthalmic solution Commonly known as:  ISOPTO TEARS / GONIOVISC Place 1 drop into both eyes as needed for dry eyes.   hydrOXYzine 50 MG tablet Commonly known as:  ATARAX/VISTARIL Take 1 tablet (50 mg total) 3 (three) times daily as needed by mouth.   Icosapent Ethyl 1 g Caps Commonly known as:  VASCEPA 2 caps bid   insulin regular human CONCENTRATED 500 UNIT/ML injection Commonly known as:  HUMULIN R USE UP TO 0.5 ML IN INSULIN PUMP daily.   insulin regular human CONCENTRATED 500 UNIT/ML injection Commonly known as:  HUMULIN R USE 0.25 ML DAILY OR AS DIRECTED IN INSULIN PUMP E11.65   isosorbide mononitrate 30 MG 24 hr tablet Commonly known as:  IMDUR TAKE TWO TABLETS BY MOUTH ONCE DAILY   liraglutide 18 MG/3ML Sopn Commonly known as:  VICTOZA INJECT 0.3MLS (1.8 MG TOTAL) INTO THE SKIN DAILY   lisinopril 5 MG tablet Commonly known as:  PRINIVIL,ZESTRIL Take 5 mg by mouth every evening.   pantoprazole 40 MG tablet Commonly known as:  PROTONIX TAKE ONE TABLET BY MOUTH ONCE DAILY   sertraline 100 MG tablet Commonly known as:  ZOLOFT Take 100 mg by mouth daily.   torsemide 20 MG tablet Commonly known as:  DEMADEX Take 20 mg by mouth 2 (two) times daily.   traMADol 50 MG tablet Commonly known as:  ULTRAM Take 1-2 tablets (50-100 mg total) by mouth every 6 (six) hours as needed for moderate pain or severe pain.       Allergies:  Allergies  Allergen Reactions  . Adhesive [Tape]     blisters    Past Medical History:  Diagnosis Date  . Anemia, iron deficiency   . Anxiety   . Arthritis   . BPH (benign prostatic hypertrophy)   . CAD (coronary artery disease)    Nonobstructive CAD per cath  . Cardiac pacemaker in situ   . CHF (congestive heart  failure) ([place])   . Chronic ulcer of right foot ([place])   . CKD (chronic kidney disease), stage III ([place]) secondary to DM and HTN   nephrologist-  [name]  . Dyspnea   . History of cellulitis    right great toe 10-25-2014  . History of skin cancer   . HOCM (hypertrophic obstructive cardiomyopathy) ([place])   . Hypertension   . Insulin dependent type 2 diabetes mellitus ([place]) 1991   followd by [name]--  has insulin pump  . Insulin pump in place   . OSA on CPAP   . Peripheral neuropathy    severe  . Peripheral vascular disease ([place])    bilateral lower extremities  . Rib fracture 07/24/2015  . Secondary hyperparathyroidism of renal origin ([place])   . Sinus node dysfunction [facility])     Past Surgical History:  Procedure Laterality Date  . AMPUTATION OF REPLICATED TOES  Mar 5732   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)  . AMPUTATION TOE Right 03/12/2015   Procedure: RIGHT HALLUS AMPUTATION ;  Surgeon: [name], MD;  Location: [facility];  Service: Podiatry;  Laterality: Right;  . CARDIAC CATHETERIZATION  11-25-2010   [place], [place]   Nonobstructive CAD  . CARDIAC PACEMAKER PLACEMENT  Nov 2009   Medtronic  . CHOLECYSTECTOMY N/A 03/05/2017   Procedure: LAPAROSCOPIC CHOLECYSTECTOMY WITH INTRAOPERATIVE CHOLANGIOGRAM;  Surgeon: [name], MD;  Location: [facility];  Service: General;  Laterality: N/A;  . EP IMPLANTABLE DEVICE N/A 06/03/2015   Procedure: PPM Generator Changeout;  Surgeon: [name], MD;  Location: [facility];  Service: Cardiovascular;  Laterality: N/A;  . EXCISION BONE CYST Right 03/06/2015   Procedure: BONE BIOPSIES OF RIGHT FOOT;  Surgeon: [name], MD;  Location: [facility];  Service: Podiatry;  Laterality: Right;  . ORIF ANKLE FRACTURE Left 11/06/2014   Procedure: OPEN REDUCTION INTERNAL FIXATION (ORIF) LEFT  ANKLE FRACTURE;  Surgeon: [name], MD;  Location: [facility];  Service: Orthopedics;  Laterality: Left;    . TOTAL KNEE ARTHROPLASTY    . VEIN LIGATION AND STRIPPING      Family History  Problem Relation Age of Onset  . Cancer Mother        breast  . Heart attack Father     Social History:  reports that he quit smoking about 33 years ago. He has a 60.00 pack-year smoking history. He has quit using smokeless tobacco. He reports that he drinks about 0.6 oz of alcohol per week. He reports that he does not use drugs.    Review of Systems        Lipids: Had been on a Lipitor since late 2014. Also had taken Gembrozil for several years without apparent side effects.  Both of these were  stopped because of increased liver functions and LFT has been back to normal  HDL is consistently low LDL is below 100   He is taking  fish oil OTC  but triglycerides are high, was prescribed Vascepa but triglycerides are still over 200   Lab Results  Component Value Date   CHOL 175 07/21/2017   CHOL 168 05/23/2017   CHOL 158 02/17/2017   Lab Results  Component Value Date   HDL 25.10 (L) 07/21/2017   HDL 22.20 (L) 05/23/2017   HDL 25.40 (L) 02/17/2017   Lab Results  Component Value Date   LDLCALC 94 02/17/2017   LDLCALC 49 10/29/2013   Lab Results  Component Value Date   TRIG 259.0 (H) 07/21/2017   TRIG 282.0 (H) 05/23/2017   TRIG 194.0 (H) 02/17/2017   Lab Results  Component Value Date   CHOLHDL 7 07/21/2017   CHOLHDL 8 05/23/2017   CHOLHDL 6 02/17/2017   Lab Results  Component Value Date   LDLDIRECT 76.0 07/21/2017   LDLDIRECT 89.0 05/23/2017   LDLDIRECT 77.0 08/05/2016      HYPERTENSION: The blood pressure has been treated with various drugs by his other physicians. Cardura was added when blood pressure was higher previously and he continues to take this He thinks his blood pressure was fluctuating at home recently  BP Readings from Last 3 Encounters:  07/24/17 (!) 144/58  06/29/17 (!) 146/60  06/22/17 138/70     Chronic kidney disease: Etiology unknown, is followed by  nephrologist  Last creatinine:  Lab Results  Component Value Date   CREATININE 2.13 (H) 06/29/2017        Has history of Numbness in his feet  since about 2013  He has been wearing diabetic shoes.  He has been followed by podiatrist He is  under the care of a podiatrist for Regular care     Physical Examination:  BP (!) 144/58 (Cuff Size: Large)   Pulse 80   Ht 6\' 4"  (1.93 m)   Wt 289 lb 6.4 oz (131.3 kg)   SpO2 95%   BMI 35.23 kg/m     ASSESSMENT:  Diabetes type 2, uncontrolled with obesity See history of present illness for detailed discussion  of current diabetes management, blood sugar patterns and problems identified   His last A1c was 8.1 Although his fructosamine is only mildly increased at 317 has recent blood sugars are averaging 186 He is again not following instructions for day-to-day pump management despite talking to the nurse educator  Slightly he is having difficulties with his memory and not able to follow instructions especially not bolusing for all his snacks Also he is eating high glycemic foods like cereal especially late at night FASTING blood sugars are higher now than usual This is despite his blood sugars averaging only about 147 in the last 2 weeks on his pump  Most likely has postprandial hyperglycemia and he does not check his readings after meals Also has frequent missed boluses, despite eating at least 2 meals a day he is having and bolus average of 0.8 per day Also not changing his infusion set regularly even though this has been emphasized to a couple of times at least No monitoring available after meals also His weight has gone up Day-to-day management of insulin pump and glucose monitoring was discussed in detail   LIPIDS: Needs to have mild increase in triglycerides, unable to lose weight causing continued insulin resistance  PLAN:   Detailed instructions given  Needs to be seen by dietitian and will arrange this, emphasized that  he should have his wife go with him to be appointment with the dietitian and also come to the office here so that his instructions can be implemented  He will also need to bring his glucose monitor in case his blood sugars are not being transmitted to the pump when not bolusing He will call if he has any tendency to hypoglycemia Continue Victoza  His pump settings were changed in the office today since he is not able to do it himself and needs help doing it even today: His 5 AM basal rate will be 1.2 and 9 PM = 0.8  Make sure and bolus for all SNACKS and also meals regardless of sugar level at the time of eating Must bolus for any snacks after supper.  At least every other day try not to eat cereal since this makes the blood sugar go up too fast.  May have a low-fat protein snack with low-fat dairy product or low-fat peanut butter at night  Must check sugars at bedtime to see how your evening boluses doing  Change the tubing on Tuesdays and Saturdays and not wait till the insulin runs out  Check your blood sugar when you wake up even if you are not planning to eat breakfast  Your basal rate is going to be increased at 5 AM up to 1.2  Please sure your wife this instruction sheet and have her come with you for all appointments     Counseling time on subjects discussed in assessment and plan sections is over 50% of today's 25 minute visit   Patient Instructions  Make sure and bolus for all SNACKS and also meals regardless of sugar level at the time of eating Must bolus for any snacks after supper.  At least every other day try not to eat cereal since this makes the blood sugar go up too fast.  May have a low-fat protein snack with low-fat dairy product or low-fat peanut butter at night  Must check sugars at bedtime to see how your evening boluses doing  Change the tubing on Tuesdays and Saturdays and not wait till the insulin runs out  Check  your blood sugar when you wake up  even if you are not planning to eat breakfast  Your basal rate is going to be increased at 5 AM up to 1.2  Please sure your wife this instruction sheet and have her come with you for all appointments      Elayne Snare 07/24/2017, 2:24 PM   Note: This office note was prepared with Dragon voice recognition system technology. Any transcriptional errors that result from this process are unintentional.

## 2017-07-24 ENCOUNTER — Encounter: Payer: Self-pay | Admitting: Endocrinology

## 2017-07-24 ENCOUNTER — Ambulatory Visit (INDEPENDENT_AMBULATORY_CARE_PROVIDER_SITE_OTHER): Payer: Medicare Other | Admitting: Endocrinology

## 2017-07-24 VITALS — BP 144/58 | HR 80 | Ht 76.0 in | Wt 289.4 lb

## 2017-07-24 DIAGNOSIS — E1165 Type 2 diabetes mellitus with hyperglycemia: Secondary | ICD-10-CM

## 2017-07-24 DIAGNOSIS — I10 Essential (primary) hypertension: Secondary | ICD-10-CM

## 2017-07-24 DIAGNOSIS — E782 Mixed hyperlipidemia: Secondary | ICD-10-CM

## 2017-07-24 DIAGNOSIS — Z794 Long term (current) use of insulin: Secondary | ICD-10-CM

## 2017-07-24 NOTE — Patient Instructions (Addendum)
Make sure and bolus for all SNACKS and also meals regardless of sugar level at the time of eating Must bolus for any snacks after supper.  At least every other day try not to eat cereal since this makes the blood sugar go up too fast.  May have a low-fat protein snack with low-fat dairy product or low-fat peanut butter at night  Must check sugars at bedtime to see how your evening boluses doing  Change the tubing on Tuesdays and Saturdays and not wait till the insulin runs out  Check your blood sugar when you wake up even if you are not planning to eat breakfast  Your basal rate is going to be increased at 5 AM up to 1.2  Please sure your wife this instruction sheet and have her come with you for all appointments

## 2017-07-25 ENCOUNTER — Other Ambulatory Visit: Payer: Self-pay | Admitting: Family

## 2017-07-31 DIAGNOSIS — Z6834 Body mass index (BMI) 34.0-34.9, adult: Secondary | ICD-10-CM | POA: Diagnosis not present

## 2017-07-31 DIAGNOSIS — D509 Iron deficiency anemia, unspecified: Secondary | ICD-10-CM | POA: Diagnosis not present

## 2017-07-31 DIAGNOSIS — E1122 Type 2 diabetes mellitus with diabetic chronic kidney disease: Secondary | ICD-10-CM | POA: Diagnosis not present

## 2017-07-31 DIAGNOSIS — N2581 Secondary hyperparathyroidism of renal origin: Secondary | ICD-10-CM | POA: Diagnosis not present

## 2017-07-31 DIAGNOSIS — R3911 Hesitancy of micturition: Secondary | ICD-10-CM | POA: Diagnosis not present

## 2017-07-31 DIAGNOSIS — N183 Chronic kidney disease, stage 3 (moderate): Secondary | ICD-10-CM | POA: Diagnosis not present

## 2017-07-31 DIAGNOSIS — N401 Enlarged prostate with lower urinary tract symptoms: Secondary | ICD-10-CM | POA: Diagnosis not present

## 2017-07-31 DIAGNOSIS — M869 Osteomyelitis, unspecified: Secondary | ICD-10-CM | POA: Diagnosis not present

## 2017-07-31 DIAGNOSIS — E875 Hyperkalemia: Secondary | ICD-10-CM | POA: Diagnosis not present

## 2017-07-31 DIAGNOSIS — I129 Hypertensive chronic kidney disease with stage 1 through stage 4 chronic kidney disease, or unspecified chronic kidney disease: Secondary | ICD-10-CM | POA: Diagnosis not present

## 2017-07-31 DIAGNOSIS — R809 Proteinuria, unspecified: Secondary | ICD-10-CM | POA: Diagnosis not present

## 2017-08-09 ENCOUNTER — Other Ambulatory Visit: Payer: Self-pay | Admitting: Family

## 2017-08-09 ENCOUNTER — Telehealth: Payer: Self-pay | Admitting: *Deleted

## 2017-08-09 NOTE — Telephone Encounter (Signed)
Pt requested the phone number to the agency Dr. Cannon Kettle had ordered the compression hose. I reviewed messages and the rx was sent to [place].

## 2017-08-10 ENCOUNTER — Telehealth: Payer: Self-pay | Admitting: Sports Medicine

## 2017-08-10 DIAGNOSIS — R609 Edema, unspecified: Secondary | ICD-10-CM

## 2017-08-10 NOTE — Telephone Encounter (Signed)
I left message informing pt I would fax the rx for the compression hose to [facility] [phone] and message the DME personnel, as soon as I hung up from leaving the message. I

## 2017-08-10 NOTE — Addendum Note (Signed)
Addended by: Harriett Sine D on: 08/10/2017 05:01 PM   Modules accepted: Orders

## 2017-08-10 NOTE — Telephone Encounter (Signed)
I'd like to leave a message for Dr. Cannon Kettle in regards to compression stockings. She had sent a script over to [facility] believe. They don't have a record of it and I don't know if she has the wrong fax number. If you could give me a call back I would appreciate it and I could give you the current fax number for those people. My number is [phone]. Thank you.

## 2017-08-14 ENCOUNTER — Ambulatory Visit (INDEPENDENT_AMBULATORY_CARE_PROVIDER_SITE_OTHER): Payer: Medicare Other | Admitting: *Deleted

## 2017-08-14 DIAGNOSIS — I495 Sick sinus syndrome: Secondary | ICD-10-CM | POA: Diagnosis not present

## 2017-08-15 ENCOUNTER — Encounter: Payer: Self-pay | Admitting: Cardiology

## 2017-08-15 ENCOUNTER — Other Ambulatory Visit: Payer: Self-pay

## 2017-08-15 ENCOUNTER — Encounter: Payer: Self-pay | Admitting: Family Medicine

## 2017-08-15 MED ORDER — CARVEDILOL 25 MG PO TABS: 25.0000 mg | ORAL_TABLET | Freq: Two times a day (BID) | ORAL | 1 refills | Status: DC

## 2017-08-15 NOTE — Progress Notes (Signed)
Remote pacemaker transmission.   

## 2017-08-16 LAB — CUP PACEART REMOTE DEVICE CHECK
Battery Remaining Longevity: 76 mo
Battery Voltage: 3 V
Brady Statistic AP VP Percent: 0.04 %
Brady Statistic AP VS Percent: 99.84 %
Brady Statistic AS VP Percent: 0 %
Brady Statistic AS VS Percent: 0.11 %
Brady Statistic RA Percent Paced: 99.81 %
Brady Statistic RV Percent Paced: 0.05 %
Date Time Interrogation Session: 20190401171744
Implantable Lead Implant Date: 20091102
Implantable Lead Implant Date: 20091102
Implantable Lead Location: 753859
Implantable Lead Location: 753860
Implantable Lead Model: 5076
Implantable Lead Model: 5076
Implantable Pulse Generator Implant Date: 20170118
Lead Channel Impedance Value: 418 Ohm
Lead Channel Impedance Value: 418 Ohm
Lead Channel Impedance Value: 570 Ohm
Lead Channel Impedance Value: 988 Ohm
Lead Channel Pacing Threshold Amplitude: 1.75 V
Lead Channel Pacing Threshold Amplitude: 2.25 V
Lead Channel Pacing Threshold Pulse Width: 0.4 ms
Lead Channel Pacing Threshold Pulse Width: 0.4 ms
Lead Channel Sensing Intrinsic Amplitude: 1.375 mV
Lead Channel Sensing Intrinsic Amplitude: 1.375 mV
Lead Channel Sensing Intrinsic Amplitude: 21.5 mV
Lead Channel Sensing Intrinsic Amplitude: 21.5 mV
Lead Channel Setting Pacing Amplitude: 2.5 V
Lead Channel Setting Pacing Amplitude: 3.5 V
Lead Channel Setting Pacing Pulse Width: 0.4 ms
Lead Channel Setting Sensing Sensitivity: 2.8 mV

## 2017-08-17 ENCOUNTER — Encounter: Payer: Self-pay | Admitting: Dietician

## 2017-08-17 ENCOUNTER — Encounter: Payer: Medicare Other | Attending: Endocrinology | Admitting: Dietician

## 2017-08-17 DIAGNOSIS — IMO0002 Reserved for concepts with insufficient information to code with codable children: Secondary | ICD-10-CM

## 2017-08-17 DIAGNOSIS — E1165 Type 2 diabetes mellitus with hyperglycemia: Secondary | ICD-10-CM | POA: Diagnosis not present

## 2017-08-17 DIAGNOSIS — Z794 Long term (current) use of insulin: Secondary | ICD-10-CM | POA: Insufficient documentation

## 2017-08-17 DIAGNOSIS — E1122 Type 2 diabetes mellitus with diabetic chronic kidney disease: Secondary | ICD-10-CM

## 2017-08-17 DIAGNOSIS — Z713 Dietary counseling and surveillance: Secondary | ICD-10-CM | POA: Diagnosis not present

## 2017-08-17 DIAGNOSIS — E1149 Type 2 diabetes mellitus with other diabetic neurological complication: Secondary | ICD-10-CM

## 2017-08-17 DIAGNOSIS — N183 Chronic kidney disease, stage 3 (moderate): Secondary | ICD-10-CM

## 2017-08-17 NOTE — Patient Instructions (Addendum)
Find ways to be more active.  Aim for 30 minutes most days.   Consider armchair exercises on you tube Next time you buy the vitamin B-12 get the dissolvable or sublingual.   (Easier absorbed). Read labels for carbohydrate, portion size, sodium, fat.  Sodium intake should be 2000 mg of sodium or less per day.   Be sure to have at least 30 grams of carbohydrates per meal and 15-30 grams at bed time. Consider Boost Glucose Control.  Small amounts of protein with each meal and snack  Talk with LInda to consider adjusting the pump due to lows overnight and about CGM.

## 2017-08-17 NOTE — Progress Notes (Signed)
Diabetes Self-Management Education  Visit Type: First/Initial  Appt. Start Time: 1530 Appt. End Time: 1630  08/17/2017  [name], identified by name and date of birth, is a 77 y.o. male with a diagnosis of Diabetes: Type 2. Other history includes HTN, CHD, hyperlipidemia, OSA on c-pap, to amputations. Vitamin b-12 deficiency.  He has been having increased swelling in his calves and fluid pill was increased per patient. History also includes CKD with GFR 32, BUN 42, and creatinine 2.13 06/29/17.   Her reports need to exercise but lacks motivation and his wife is not supportive.  ([facility] but when he suggested he go, she stated that he was not every consistent and shouldn't bother.)   He has been on the Medtronic pump since 04/18/14.  He qualifies for a new one in 2020. He is very concerned about low blood sugars especially at night.  He checks his blood sugar 4 times daily.    Weight hx: 287 lbs today 290 lbs highest weight 281 lbs 6 weeks ago 200 weight [place] [employer] Discussed the benefits of increased activity.  Patient lives with his wife.  She does the shopping and cooking. He tries to follow a low sodium diet and states that his wife knows the guidelines and does not cook with salt.  He does not have a good appetite in the morning and does not eat much.  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    ASSESSMENT  Height 6\' 4"  (1.93 m), weight 287 lb (130.2 kg). Body mass index is 34.93 kg/m.  Diabetes Self-Management Education - 08/17/17 1549      Visit Information   Visit Type  First/Initial      Initial Visit   Diabetes Type  Type 2    Are you currently following a meal plan?  No    Are you taking your medications as prescribed?  Yes    Date Diagnosed  [place]   How would you rate your overall health?  Excellent      Psychosocial Assessment   Patient Belief/Attitude about Diabetes  Motivated to manage diabetes    Self-care barriers  None     Self-management support  Doctor's office;Family    Other persons present  Patient    Patient Concerns  Nutrition/Meal planning;Weight Control;Glycemic Control;Healthy Lifestyle    Special Needs  None    Preferred Learning Style  No preference indicated    Learning Readiness  Ready    How often do you need to have someone help you when you read instructions, pamphlets, or other written materials from your doctor or pharmacy?  1 - Never    What is the last grade level you completed in school?  12th grade      Pre-Education Assessment   Patient understands the diabetes disease and treatment process.  Needs Review    Patient understands incorporating nutritional management into lifestyle.  Needs Review    Patient undertands incorporating physical activity into lifestyle.  Needs Review    Patient understands using medications safely.  Demonstrates understanding / competency    Patient understands monitoring blood glucose, interpreting and using results  Demonstrates understanding / competency    Patient understands prevention, detection, and treatment of acute complications.  Demonstrates understanding / competency    Patient understands prevention, detection, and treatment of chronic complications.  Demonstrates understanding / competency    Patient understands how to develop strategies to  address psychosocial issues.  Demonstrates understanding / competency    Patient understands how to develop strategies to promote health/change behavior.  Needs Review      Complications   Last HgB A1C per patient/outside source  8.1 % 05/23/17     How often do you check your blood sugar?  > 4 times/day fear of low blood sugar    Number of hypoglycemic episodes per month  -- several per patient    Have you had a dilated eye exam in the past 12 months?  Yes    Have you had a dental exam in the past 12 months?  No dentures    Are you checking your feet?  Yes    How many days per week are you checking your  feet?  7      Dietary Intake   Breakfast  2 slices [employer] toast, butter, eggs, occasional OJ OR cheerios, soy milk OR 1/2 PB sandwich    Snack (morning)  none    Lunch  skips or 1/2 PB sandwich, fruit cup    Snack (afternoon)  1/2 PB sandwich, canned soup    Dinner  meat, vegetables, rice, peas OR other things that can be cooked in the instapot    Snack (evening)  fruit cup (2), mini milky way (1)    Beverage(s)  coffee, creamer, water, diet pepsi (1) or other diet soda      Exercise   Exercise Type  ADL's      Patient Education   Previous Diabetes Education  Yes (please comment) Linde CDE multiple times    Disease state   -- reviewed insulin resistance    Nutrition management   Role of diet in the treatment of diabetes and the relationship between the three main macronutrients and blood glucose level;Meal options for control of blood glucose level and chronic complications.;Effects of alcohol on blood glucose and safety factors with consumption of alcohol.;Food label reading, portion sizes and measuring food.;Other (comment) low sodium    Physical activity and exercise   Role of exercise on diabetes management, blood pressure control and cardiac health.;Helped patient identify appropriate exercises in relation to his/her diabetes, diabetes complications and other health issue.    Medications  Reviewed patients medication for diabetes, action, purpose, timing of dose and side effects.    Monitoring  -- brief description of CGM's.  Patient cant get new Medtronic until 2020 and interested in pump or CGM that alarms.    Acute complications  Other (comment) discussed lows and how to prevent    Psychosocial adjustment  Worked with patient to identify barriers to care and solutions;Identified and addressed patients feelings and concerns about diabetes    Personal strategies to promote health  Lifestyle issues that need to be addressed for better diabetes care      Individualized Goals (developed by  patient)   Nutrition  Follow meal plan discussed consider to carb count    Physical Activity  Exercise 3-5 times per week;30 minutes per day    Medications  take my medication as prescribed    Monitoring   test my blood glucose as discussed    Problem Solving  prevention of lows, motivation for exercise    Reducing Risk  examine blood glucose patterns    Health Coping  discuss diabetes with (comment) MD, RD, CDE      Post-Education Assessment   Patient understands the diabetes disease and treatment process.  Demonstrates understanding / competency  Patient understands incorporating nutritional management into lifestyle.  Demonstrates understanding / competency    Patient undertands incorporating physical activity into lifestyle.  Demonstrates understanding / competency    Patient understands using medications safely.  Demonstrates understanding / competency    Patient understands monitoring blood glucose, interpreting and using results  Demonstrates understanding / competency    Patient understands prevention, detection, and treatment of acute complications.  Demonstrates understanding / competency    Patient understands prevention, detection, and treatment of chronic complications.  Demonstrates understanding / competency    Patient understands how to develop strategies to address psychosocial issues.  Demonstrates understanding / competency    Patient understands how to develop strategies to promote health/change behavior.  Demonstrates understanding / competency      Outcomes   Expected Outcomes  Demonstrated interest in learning. Expect positive outcomes    Future DMSE  PRN    Program Status  Completed       Individualized Plan for Diabetes Self-Management Training:   Learning Objective:  Patient will have a greater understanding of diabetes self-management. Patient education plan is to attend individual and/or group sessions per assessed needs and concerns.   Plan:   Patient  Instructions  Find ways to be more active.  Aim for 30 minutes most days.   Consider armchair exercises on you tube Next time you buy the vitamin B-12 get the dissolvable or sublingual.   (Easier absorbed). Read labels for carbohydrate, portion size, sodium, fat.  Sodium intake should be 2000 mg of sodium or less per day.   Be sure to have at least 30 grams of carbohydrates per meal and 15-30 grams at bed time. Consider Boost Glucose Control.  Small amounts of protein with each meal and snack  Talk with LInda to consider adjusting the pump due to lows overnight and about CGM.     Expected Outcomes:  Demonstrated interest in learning. Expect positive outcomes  Education material provided: Snack sheet  If problems or questions, patient to contact team via:  Phone  Future DSME appointment: PRN

## 2017-08-29 ENCOUNTER — Ambulatory Visit (INDEPENDENT_AMBULATORY_CARE_PROVIDER_SITE_OTHER): Payer: Medicare Other | Admitting: Sports Medicine

## 2017-08-29 ENCOUNTER — Encounter: Payer: Self-pay | Admitting: Sports Medicine

## 2017-08-29 DIAGNOSIS — M79674 Pain in right toe(s): Secondary | ICD-10-CM | POA: Diagnosis not present

## 2017-08-29 DIAGNOSIS — B351 Tinea unguium: Secondary | ICD-10-CM | POA: Diagnosis not present

## 2017-08-29 DIAGNOSIS — M79675 Pain in left toe(s): Secondary | ICD-10-CM | POA: Diagnosis not present

## 2017-08-29 DIAGNOSIS — E0842 Diabetes mellitus due to underlying condition with diabetic polyneuropathy: Secondary | ICD-10-CM

## 2017-08-29 DIAGNOSIS — Z899 Acquired absence of limb, unspecified: Secondary | ICD-10-CM

## 2017-08-29 NOTE — Progress Notes (Signed)
Subjective: David [name] is a 77 y.o. male patient seen in office for Diabetic nail trim. Patient has a history of diabetes and a blood glucose "good" 146 this morning, A1c 8. Reports that his toe remains healed. Started using compression stockings. Denies recurrence of wound. Denies nausea/vomitting/fever/chills/night sweats or any constitutional symptoms. Patient has no other pedal complaints at this time.  Patient Active Problem List   Diagnosis Date Noted  . Lower extremity edema 04/21/2017  . Anxiety 03/22/2017  . Acute cholecystitis s/p lap cholecystectomy 03/05/2017 03/04/2017  . 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 Fatigue 01/27/2017  . Low vitamin B12 level 01/27/2017  . 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 Syncope 07/24/2015  . 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 OSA on CPAP 10/26/2014  . Back pain 10/16/2014  . Left leg numbness 10/16/2014  . Episodic lightheadedness 08/04/2014  . Open toe wound 07/18/2014  . Diabetes mellitus with neurological manifestations, uncontrolled ([place]) 02/27/2014  . Seasonal and perennial allergic rhinitis 10/25/2013  . Hypertension associated with diabetes ([place]) 10/25/2013  . Hyperlipidemia associated with type 2 diabetes mellitus ([place]) 10/25/2013  . Morbid obesity ([place]) 10/25/2013  . CKD stage 3 due to type 2 diabetes mellitus ([place]) 10/25/2013   Current Outpatient Medications on File Prior to Visit  Medication Sig Dispense Refill  . amLODipine (NORVASC) 10 MG tablet Take 1 tablet (10 mg total) by mouth daily. 90 tablet 3  . aspirin EC 81 MG tablet Take 81 mg by mouth daily.    . calcitRIOL (ROCALTROL) 0.25 MCG capsule Take 0.25 mcg by mouth daily.     . carvedilol (COREG) 25 MG tablet Take 1 tablet (25 mg total) by mouth 2 (two) times daily with a meal. 180 tablet 1  . cholecalciferol (VITAMIN D)  1000 units tablet Take 1 tablet (1,000 Units total) by mouth daily. (Patient taking differently: Take 1,000 Units by mouth every evening. ) 30 tablet 1  . CONTOUR NEXT TEST test strip USE AS INSTRUCTED TO CHECK BLOOD SUGAR 6 TIMES PER DAY 200 each 11  . doxazosin (CARDURA) 4 MG tablet TAKE 1 TABLET BY MOUTH ONCE DAILY 90 tablet 0  . doxycycline (VIBRA-TABS) 100 MG tablet Take 1 tablet (100 mg total) by mouth 2 (two) times daily. 14 tablet 0  . hydroxypropyl methylcellulose / hypromellose (ISOPTO TEARS / GONIOVISC) 2.5 % ophthalmic solution Place 1 drop into both eyes as needed for dry eyes.    . hydrOXYzine (ATARAX/VISTARIL) 50 MG tablet Take 1 tablet (50 mg total) 3 (three) times daily as needed by mouth. 30 tablet 1  . Icosapent Ethyl (VASCEPA) 1 g CAPS 2 caps bid 120 capsule 2  . insulin regular human CONCENTRATED (HUMULIN R) 500 UNIT/ML injection USE UP TO 0.5 ML IN INSULIN PUMP daily. 1 vial 1  . insulin regular human CONCENTRATED (HUMULIN R) 500 UNIT/ML injection USE 0.25 ML DAILY OR AS DIRECTED IN INSULIN PUMP E11.65 20 mL 4  . isosorbide mononitrate (IMDUR) 30 MG 24 hr tablet TAKE TWO TABLETS BY MOUTH ONCE DAILY 60 tablet 11  . liraglutide (VICTOZA) 18 MG/3ML SOPN INJECT 0.3MLS (1.8 MG TOTAL) INTO THE SKIN DAILY 9 pen 1  . lisinopril (PRINIVIL,ZESTRIL) 5 MG tablet Take 5 mg by mouth every evening.     . Omega-3 Fatty Acids (FISH OIL) 1000 MG CAPS Take by mouth 2 (two) times daily.    Marland Kitchen  pantoprazole (PROTONIX) 40 MG tablet TAKE ONE TABLET BY MOUTH ONCE DAILY 90 tablet 1  . sertraline (ZOLOFT) 100 MG tablet Take 100 mg by mouth daily.    Marland Kitchen torsemide (DEMADEX) 20 MG tablet Take 20 mg by mouth 2 (two) times daily.     . traMADol (ULTRAM) 50 MG tablet Take 1-2 tablets (50-100 mg total) by mouth every 6 (six) hours as needed for moderate pain or severe pain. 30 tablet 0  . vitamin B-12 (CYANOCOBALAMIN) 100 MCG tablet Take 100 mcg by mouth daily.     No current facility-administered medications  on file prior to visit.    Allergies  Allergen Reactions  . Adhesive [Tape]     blisters    Recent Results (from the past 2160 hour(s))  POCT glucose (manual entry)     Status: Abnormal   Collection Time: 06/29/17 11:01 AM  Result Value Ref Range   POC Glucose 262 (A) 70 - 99 mg/dl  CBC     Status: Abnormal   Collection Time: 06/29/17 11:44 AM  Result Value Ref Range   WBC 8.2 4.0 - 10.5 K/uL   RBC 3.92 (L) 4.22 - 5.81 Mil/uL   Platelets 207.0 150.0 - 400.0 K/uL   Hemoglobin 11.9 (L) 13.0 - 17.0 g/dL   HCT 35.7 (L) 39.0 - 52.0 %   MCV 91.1 78.0 - 100.0 fl   MCHC 33.3 30.0 - 36.0 g/dL   RDW 15.6 (H) 11.5 - 15.5 %  Comprehensive metabolic panel     Status: Abnormal   Collection Time: 06/29/17 11:44 AM  Result Value Ref Range   Sodium 137 135 - 145 mEq/L   Potassium 4.6 3.5 - 5.1 mEq/L   Chloride 103 96 - 112 mEq/L   CO2 26 19 - 32 mEq/L   Glucose, Bld 315 (H) 70 - 99 mg/dL   BUN 42 (H) 6 - 23 mg/dL   Creatinine, Ser 2.13 (H) 0.40 - 1.50 mg/dL   Total Bilirubin 0.6 0.2 - 1.2 mg/dL   Alkaline Phosphatase 101 39 - 117 U/L   AST 19 0 - 37 U/L   ALT 22 0 - 53 U/L   Total Protein 6.9 6.0 - 8.3 g/dL   Albumin 3.8 3.5 - 5.2 g/dL   Calcium 9.1 8.4 - 10.5 mg/dL   GFR 32.19 (L) >60.00 mL/min  Glucose, random     Status: Abnormal   Collection Time: 07/21/17 11:11 AM  Result Value Ref Range   Glucose, Bld 278 (H) 70 - 99 mg/dL  Fructosamine     Status: Abnormal   Collection Time: 07/21/17 11:11 AM  Result Value Ref Range   Fructosamine 315 (H) 0 - 285 umol/L    Comment: Published reference interval for apparently healthy subjects between age 46 and 30 is 5 - 285 umol/L and in a poorly controlled diabetic population is 228 - 563 umol/L with a mean of 396 umol/L.   Lipid panel     Status: Abnormal   Collection Time: 07/21/17 11:11 AM  Result Value Ref Range   Cholesterol 175 0 - 200 mg/dL    Comment: ATP III Classification       Desirable:  < 200 mg/dL                Borderline High:  200 - 239 mg/dL          High:  > = 240 mg/dL   Triglycerides 259.0 (H) 0.0 - 149.0 mg/dL    Comment:  Normal:  <150 mg/dLBorderline High:  150 - 199 mg/dL   HDL 25.10 (L) >39.00 mg/dL   VLDL 51.8 (H) 0.0 - 40.0 mg/dL   Total CHOL/HDL Ratio 7     Comment:                Men          Women1/2 Average Risk     3.4          3.3Average Risk          5.0          4.42X Average Risk          9.6          7.13X Average Risk          15.0          11.0                       NonHDL 150.02     Comment: NOTE:  Non-HDL goal should be 30 mg/dL higher than patient's LDL goal (i.e. LDL goal of < 70 mg/dL, would have non-HDL goal of < 100 mg/dL)  LDL cholesterol, direct     Status: None   Collection Time: 07/21/17 11:11 AM  Result Value Ref Range   Direct LDL 76.0 mg/dL    Comment: Optimal:  <100 mg/dLNear or Above Optimal:  100-129 mg/dLBorderline High:  130-159 mg/dLHigh:  160-189 mg/dLVery High:  >190 mg/dL  CUP PACEART REMOTE DEVICE CHECK     Status: None   Collection Time: 08/14/17  5:17 PM  Result Value Ref Range   Date Time Interrogation Session [id number]    Pulse Generator Manufacturer MERM    Pulse Gen Model [id number] Advisa DR MRI    Pulse Gen Serial Number [id number]    Clinic Name [facility]    Implantable Pulse Generator Type Implantable Pulse Generator    Implantable Pulse Generator Implant Date [id number]    Implantable Lead Manufacturer MERM    Implantable Lead Model 5076 CapSureFix Novus    Implantable Lead Serial Number [id number]    Implantable Lead Implant Date 19509326    Implantable Lead Location Detail 1 UNKNOWN    Implantable Lead Location [id number]    Implantable Lead Manufacturer MERM    Implantable Lead Model 5076 CapSureFix Novus    Implantable Lead Serial Number [id number]    Implantable Lead Implant Date [id number]    Implantable Lead Location Detail 1 UNKNOWN    Implantable Lead Location [phone]    Lead Channel Setting Sensing Sensitivity 2.8 mV    Lead Channel Setting Pacing Amplitude 2.5 V   Lead Channel Setting Pacing Pulse Width 0.4 ms   Lead Channel Setting Pacing Amplitude 3.5 V   Lead Channel Impedance Value 570 ohm   Lead Channel Impedance Value 418 ohm   Lead Channel Sensing Intrinsic Amplitude 1.375 mV   Lead Channel Sensing Intrinsic Amplitude 1.375 mV   Lead Channel Pacing Threshold Amplitude 2.25 V   Lead Channel Pacing Threshold Pulse Width 0.4 ms   Lead Channel Impedance Value 988 ohm   Lead Channel Impedance Value 418 ohm   Lead Channel Sensing Intrinsic Amplitude 21.5 mV   Lead Channel Sensing Intrinsic Amplitude 21.5 mV   Lead Channel Pacing Threshold Amplitude 1.75 V   Lead Channel Pacing Threshold Pulse Width 0.4 ms   Battery Status OK    Battery Remaining Longevity 76 mo   Battery Voltage 3.00 V  [facility] Statistic RA Percent Paced 99.81 %   Brady Statistic RV Percent Paced 0.05 %   Brady Statistic AP VP Percent 0.04 %   Brady Statistic AS VP Percent 0 %   Brady Statistic AP VS Percent 99.84 %   Brady Statistic AS VS Percent 0.11 %   Eval Rhythm ApVs     Objective: There were no vitals filed for this visit.  General: Patient is awake, alert, oriented x 3 and in no acute distress.  Dermatology: Skin is warm and dry bilateral with a continued healed ulceration present left 3rd toe distal tuft, no erythema, no edema. No acute signs of infection. Nails are elongated, thick, and mycotic.    Vascular: Dorsalis Pedis pulse = 1/4 Bilateral,  Posterior Tibial pulse = 1/4 Bilateral,  Capillary Fill Time < 5 seconds, trace edema bilateral.   Neurologic: Protective sensation absent to the level of knee using  the 5.07/10g [employer].  Musculosketal:  No Pain with palpation to healed ulceration. No pain with compression to calves bilateral. Hammertoe and history of toe amps on right.  Assessment and Plan:  Problem List Items Addressed This Visit    None    Visit Diagnoses    Pain due to  onychomycosis of toenails of both feet    -  Primary   Diabetic polyneuropathy associated with diabetes mellitus due to underlying condition ([place])       History of amputation         -Complete examination performed  -Nails mechanically debrided using sterile chisel blade without incident -Continue with elevation of legs and PCP management of edema  and compression stockings -Patient to return to office in 10-12 weeks for follow up diabetic nail care.   Landis Martins, DPM

## 2017-08-30 ENCOUNTER — Emergency Department (HOSPITAL_COMMUNITY): Payer: Medicare Other

## 2017-08-30 ENCOUNTER — Other Ambulatory Visit: Payer: Self-pay

## 2017-08-30 ENCOUNTER — Encounter (HOSPITAL_COMMUNITY): Payer: Self-pay

## 2017-08-30 ENCOUNTER — Inpatient Hospital Stay (HOSPITAL_COMMUNITY)
Admission: EM | Admit: 2017-08-30 | Discharge: 2017-09-04 | DRG: 064 | Disposition: A | Discharge status: Rehabilitation Facility | Payer: Medicare Other | Attending: Internal Medicine | Admitting: Internal Medicine

## 2017-08-30 DIAGNOSIS — E1122 Type 2 diabetes mellitus with diabetic chronic kidney disease: Secondary | ICD-10-CM | POA: Diagnosis present

## 2017-08-30 DIAGNOSIS — I63411 Cerebral infarction due to embolism of right middle cerebral artery: Secondary | ICD-10-CM | POA: Diagnosis not present

## 2017-08-30 DIAGNOSIS — Z79899 Other long term (current) drug therapy: Secondary | ICD-10-CM

## 2017-08-30 DIAGNOSIS — I469 Cardiac arrest, cause unspecified: Secondary | ICD-10-CM | POA: Diagnosis not present

## 2017-08-30 DIAGNOSIS — I5032 Chronic diastolic (congestive) heart failure: Secondary | ICD-10-CM | POA: Diagnosis present

## 2017-08-30 DIAGNOSIS — R29810 Facial weakness: Secondary | ICD-10-CM | POA: Diagnosis not present

## 2017-08-30 DIAGNOSIS — I10 Essential (primary) hypertension: Secondary | ICD-10-CM

## 2017-08-30 DIAGNOSIS — I951 Orthostatic hypotension: Secondary | ICD-10-CM | POA: Diagnosis present

## 2017-08-30 DIAGNOSIS — I4891 Unspecified atrial fibrillation: Secondary | ICD-10-CM

## 2017-08-30 DIAGNOSIS — I739 Peripheral vascular disease, unspecified: Secondary | ICD-10-CM

## 2017-08-30 DIAGNOSIS — E1151 Type 2 diabetes mellitus with diabetic peripheral angiopathy without gangrene: Secondary | ICD-10-CM | POA: Diagnosis present

## 2017-08-30 DIAGNOSIS — I639 Cerebral infarction, unspecified: Secondary | ICD-10-CM | POA: Diagnosis not present

## 2017-08-30 DIAGNOSIS — Z89421 Acquired absence of other right toe(s): Secondary | ICD-10-CM

## 2017-08-30 DIAGNOSIS — E119 Type 2 diabetes mellitus without complications: Secondary | ICD-10-CM

## 2017-08-30 DIAGNOSIS — I248 Other forms of acute ischemic heart disease: Secondary | ICD-10-CM | POA: Diagnosis present

## 2017-08-30 DIAGNOSIS — R748 Abnormal levels of other serum enzymes: Secondary | ICD-10-CM | POA: Diagnosis present

## 2017-08-30 DIAGNOSIS — I421 Obstructive hypertrophic cardiomyopathy: Secondary | ICD-10-CM | POA: Diagnosis not present

## 2017-08-30 DIAGNOSIS — IMO0001 Reserved for inherently not codable concepts without codable children: Secondary | ICD-10-CM

## 2017-08-30 DIAGNOSIS — E1165 Type 2 diabetes mellitus with hyperglycemia: Secondary | ICD-10-CM

## 2017-08-30 DIAGNOSIS — E1142 Type 2 diabetes mellitus with diabetic polyneuropathy: Secondary | ICD-10-CM | POA: Diagnosis present

## 2017-08-30 DIAGNOSIS — Z9641 Presence of insulin pump (external) (internal): Secondary | ICD-10-CM | POA: Diagnosis present

## 2017-08-30 DIAGNOSIS — D62 Acute posthemorrhagic anemia: Secondary | ICD-10-CM | POA: Diagnosis present

## 2017-08-30 DIAGNOSIS — I130 Hypertensive heart and chronic kidney disease with heart failure and stage 1 through stage 4 chronic kidney disease, or unspecified chronic kidney disease: Secondary | ICD-10-CM | POA: Diagnosis not present

## 2017-08-30 DIAGNOSIS — E1149 Type 2 diabetes mellitus with other diabetic neurological complication: Secondary | ICD-10-CM | POA: Diagnosis present

## 2017-08-30 DIAGNOSIS — I472 Ventricular tachycardia: Secondary | ICD-10-CM | POA: Diagnosis not present

## 2017-08-30 DIAGNOSIS — Z6834 Body mass index (BMI) 34.0-34.9, adult: Secondary | ICD-10-CM

## 2017-08-30 DIAGNOSIS — IMO0002 Reserved for concepts with insufficient information to code with codable children: Secondary | ICD-10-CM

## 2017-08-30 DIAGNOSIS — N183 Chronic kidney disease, stage 3 unspecified: Secondary | ICD-10-CM

## 2017-08-30 DIAGNOSIS — Z8249 Family history of ischemic heart disease and other diseases of the circulatory system: Secondary | ICD-10-CM

## 2017-08-30 DIAGNOSIS — Z794 Long term (current) use of insulin: Secondary | ICD-10-CM

## 2017-08-30 DIAGNOSIS — Z7982 Long term (current) use of aspirin: Secondary | ICD-10-CM

## 2017-08-30 DIAGNOSIS — E869 Volume depletion, unspecified: Secondary | ICD-10-CM | POA: Diagnosis present

## 2017-08-30 DIAGNOSIS — G8324 Monoplegia of upper limb affecting left nondominant side: Secondary | ICD-10-CM | POA: Diagnosis not present

## 2017-08-30 DIAGNOSIS — Z85828 Personal history of other malignant neoplasm of skin: Secondary | ICD-10-CM

## 2017-08-30 DIAGNOSIS — R0682 Tachypnea, not elsewhere classified: Secondary | ICD-10-CM

## 2017-08-30 DIAGNOSIS — R55 Syncope and collapse: Secondary | ICD-10-CM

## 2017-08-30 DIAGNOSIS — M1712 Unilateral primary osteoarthritis, left knee: Secondary | ICD-10-CM | POA: Diagnosis present

## 2017-08-30 DIAGNOSIS — Z9989 Dependence on other enabling machines and devices: Secondary | ICD-10-CM

## 2017-08-30 DIAGNOSIS — R29705 NIHSS score 5: Secondary | ICD-10-CM | POA: Diagnosis present

## 2017-08-30 DIAGNOSIS — Z96659 Presence of unspecified artificial knee joint: Secondary | ICD-10-CM | POA: Diagnosis present

## 2017-08-30 DIAGNOSIS — I2510 Atherosclerotic heart disease of native coronary artery without angina pectoris: Secondary | ICD-10-CM | POA: Diagnosis present

## 2017-08-30 DIAGNOSIS — N2581 Secondary hyperparathyroidism of renal origin: Secondary | ICD-10-CM | POA: Diagnosis present

## 2017-08-30 DIAGNOSIS — E1169 Type 2 diabetes mellitus with other specified complication: Secondary | ICD-10-CM | POA: Diagnosis present

## 2017-08-30 DIAGNOSIS — Z9109 Other allergy status, other than to drugs and biological substances: Secondary | ICD-10-CM

## 2017-08-30 DIAGNOSIS — E785 Hyperlipidemia, unspecified: Secondary | ICD-10-CM | POA: Diagnosis present

## 2017-08-30 DIAGNOSIS — R29701 NIHSS score 1: Secondary | ICD-10-CM | POA: Diagnosis not present

## 2017-08-30 DIAGNOSIS — I422 Other hypertrophic cardiomyopathy: Secondary | ICD-10-CM | POA: Diagnosis present

## 2017-08-30 DIAGNOSIS — H3530 Unspecified macular degeneration: Secondary | ICD-10-CM | POA: Diagnosis present

## 2017-08-30 DIAGNOSIS — N400 Enlarged prostate without lower urinary tract symptoms: Secondary | ICD-10-CM | POA: Diagnosis present

## 2017-08-30 DIAGNOSIS — E1159 Type 2 diabetes mellitus with other circulatory complications: Secondary | ICD-10-CM | POA: Diagnosis present

## 2017-08-30 DIAGNOSIS — Z950 Presence of cardiac pacemaker: Secondary | ICD-10-CM | POA: Diagnosis present

## 2017-08-30 DIAGNOSIS — G4733 Obstructive sleep apnea (adult) (pediatric): Secondary | ICD-10-CM | POA: Diagnosis present

## 2017-08-30 DIAGNOSIS — I152 Hypertension secondary to endocrine disorders: Secondary | ICD-10-CM | POA: Diagnosis present

## 2017-08-30 DIAGNOSIS — I5189 Other ill-defined heart diseases: Secondary | ICD-10-CM

## 2017-08-30 DIAGNOSIS — R29703 NIHSS score 3: Secondary | ICD-10-CM | POA: Diagnosis not present

## 2017-08-30 DIAGNOSIS — Z87891 Personal history of nicotine dependence: Secondary | ICD-10-CM

## 2017-08-30 DIAGNOSIS — R451 Restlessness and agitation: Secondary | ICD-10-CM | POA: Diagnosis present

## 2017-08-30 DIAGNOSIS — E669 Obesity, unspecified: Secondary | ICD-10-CM | POA: Diagnosis present

## 2017-08-30 DIAGNOSIS — R030 Elevated blood-pressure reading, without diagnosis of hypertension: Secondary | ICD-10-CM | POA: Diagnosis not present

## 2017-08-30 LAB — BASIC METABOLIC PANEL
Anion gap: 8 (ref 5–15)
BUN: 33 mg/dL — ABNORMAL HIGH (ref 6–20)
CO2: 23 mmol/L (ref 22–32)
Calcium: 9.5 mg/dL (ref 8.9–10.3)
Chloride: 108 mmol/L (ref 101–111)
Creatinine, Ser: 1.95 mg/dL — ABNORMAL HIGH (ref 0.61–1.24)
GFR calc Af Amer: 36 mL/min — ABNORMAL LOW (ref >60)
GFR calc non Af Amer: 31 mL/min — ABNORMAL LOW (ref >60)
Glucose, Bld: 136 mg/dL — ABNORMAL HIGH (ref 65–99)
Potassium: 4.7 mmol/L (ref 3.5–5.1)
Sodium: 139 mmol/L (ref 135–145)

## 2017-08-30 LAB — CBC WITH DIFFERENTIAL/PLATELET
Basophils Absolute: 0 10*3/uL (ref 0.0–0.1)
Basophils Relative: 0 %
Eosinophils Absolute: 0.2 10*3/uL (ref 0.0–0.7)
Eosinophils Relative: 2 %
HCT: 34.8 % — ABNORMAL LOW (ref 39.0–52.0)
Hemoglobin: 11.5 g/dL — ABNORMAL LOW (ref 13.0–17.0)
Lymphocytes Relative: 18 %
Lymphs Abs: 1.6 10*3/uL (ref 0.7–4.0)
MCH: 30.1 pg (ref 26.0–34.0)
MCHC: 33 g/dL (ref 30.0–36.0)
MCV: 91.1 fL (ref 78.0–100.0)
Monocytes Absolute: 0.8 10*3/uL (ref 0.1–1.0)
Monocytes Relative: 10 %
Neutro Abs: 6 10*3/uL (ref 1.7–7.7)
Neutrophils Relative %: 70 %
Platelets: 190 10*3/uL (ref 150–400)
RBC: 3.82 MIL/uL — ABNORMAL LOW (ref 4.22–5.81)
RDW: 14.7 % (ref 11.5–15.5)
WBC: 8.6 10*3/uL (ref 4.0–10.5)

## 2017-08-30 LAB — URINALYSIS, ROUTINE W REFLEX MICROSCOPIC
Bacteria, UA: NONE SEEN
Bilirubin Urine: NEGATIVE
Glucose, UA: NEGATIVE mg/dL
Ketones, ur: NEGATIVE mg/dL
Leukocytes, UA: NEGATIVE
Nitrite: NEGATIVE
Protein, ur: 100 mg/dL — AB
Specific Gravity, Urine: 1.01 (ref 1.005–1.030)
Squamous Epithelial / LPF: NONE SEEN
pH: 5 (ref 5.0–8.0)

## 2017-08-30 LAB — CBG MONITORING, ED: Glucose-Capillary: 116 mg/dL — ABNORMAL HIGH (ref 65–99)

## 2017-08-30 NOTE — ED Provider Notes (Addendum)
[facility] EMERGENCY DEPARTMENT Provider Note   CSN: [id number] Arrival date & time: 08/30/17  2037     History   Chief Complaint Chief Complaint  Patient presents with  . Cerebrovascular Accident    HPI David [name] is a 77 y.o. male.  Level 5 caveat for altered mental status.  History obtained from patient, his wife, his son.  Wife reports confusion, speech difficulty, left arm weakness/numbness approximately 2 nights ago.  He did not initially report the symptoms to his wife.  Past medical history includes obesity, diabetes, pacemaker, cardiomyopathy, hypertension. No prodromal illnesses, but he did report a headache.     Past Medical History:  Diagnosis Date  . Anemia, iron deficiency   . Anxiety   . Arthritis   . BPH (benign prostatic hypertrophy)   . CAD (coronary artery disease)    Nonobstructive CAD per cath  . Cardiac pacemaker in situ   . CHF (congestive heart failure) ([place])   . Chronic ulcer of right foot ([place])   . CKD (chronic kidney disease), stage III ([place]) secondary to DM and HTN   nephrologist-  [name]  . Dyspnea   . History of cellulitis    right great toe 10-25-2014  . History of skin cancer   . HOCM (hypertrophic obstructive cardiomyopathy) ([place])   . Hypertension   . 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 Insulin pump in place   . OSA on CPAP   . Peripheral neuropathy    severe  . Peripheral vascular disease ([place])    bilateral lower extremities  . Rib fracture 07/24/2015  . Secondary hyperparathyroidism of renal origin ([place])   . Sinus node dysfunction (H[facility])   . Sleep apnea     Patient Active Problem List   Diagnosis Date Noted  . Lower extremity edema 04/21/2017  . Anxiety 03/22/2017  . Acute cholecystitis s/p lap cholecystectomy 03/05/2017 03/04/2017  . HOCM (hypertrophic obstructive cardiomyopathy) ([place]) 03/04/2017  . IDDM (insulin dependent  diabetes mellitus) - on insulin pump 03/04/2017  . Fatigue 01/27/2017  . Low vitamin B12 level 01/27/2017  . Chronic ulcer of right foot ([place]) 08/09/2016  . Syncope 07/24/2015  . 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 OSA on CPAP 10/26/2014  . Back pain 10/16/2014  . Left leg numbness 10/16/2014  . Episodic lightheadedness 08/04/2014  . Open toe wound 07/18/2014  . Diabetes mellitus with neurological manifestations, uncontrolled ([place]) 02/27/2014  . Seasonal and perennial allergic rhinitis 10/25/2013  . Hypertension associated with diabetes ([place]) 10/25/2013  . Hyperlipidemia associated with type 2 diabetes mellitus ([place]) 10/25/2013  . Morbid obesity ([place]) 10/25/2013  . CKD stage 3 due to type 2 diabetes mellitus ([place]) 10/25/2013    Past Surgical History:  Procedure Laterality Date  . 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 AMPUTATION TOE Right 03/12/2015   Procedure: RIGHT HALLUS AMPUTATION ;  Surgeon: [name], MD;  Location: [facility];  Service: Podiatry;  Laterality: Right;  . CARDIAC CATHETERIZATION  11-25-2010   [place], [place]   Nonobstructive CAD  . CARDIAC PACEMAKER PLACEMENT  Nov 2009   [employer]tr[employer]  . CHOLECYSTECTOMY N/A 03/05/2017   Procedure: LAPAROSCOPIC CHOLECYSTECTOMY WITH INTRAOPERATIVE CHOLANGIOGRAM;  Surgeon: [name], MD;  Location: [facility];  Service: General;  Laterality: N/A;  . EP IMPLANTABLE DEVICE N/A 06/03/2015   Procedure: PPM  Nature conservation officer;  Surgeon: [name], MD;  Location: [facility];  Service: Cardiovascular;  Laterality: N/A;  . EXCISION BONE CYST Right 03/06/2015   Procedure: BONE BIOPSIES OF RIGHT FOOT;  Surgeon: [name], MD;  Location: [facility];  Service: Podiatry;  Laterality: Right;  . ORIF ANKLE FRACTURE Left 11/06/2014   Procedure: OPEN REDUCTION INTERNAL FIXATION (ORIF) LEFT  ANKLE FRACTURE;  Surgeon: [name], MD;   Location: [facility];  Service: Orthopedics;  Laterality: Left;  . TOTAL KNEE ARTHROPLASTY    . VEIN LIGATION AND STRIPPING          Home Medications    Prior to Admission medications   Medication Sig Start Date End Date Taking? Authorizing Provider  amLODipine (NORVASC) 10 MG tablet Take 1 tablet (10 mg total) by mouth daily. 04/28/17  Yes [name], MD  aspirin EC 81 MG tablet Take 81 mg by mouth daily.   Yes [provider]  calcitRIOL (ROCALTROL) 0.25 MCG capsule Take 0.25 mcg by mouth daily.  05/30/14  Yes [provider]  carvedilol (COREG) 25 MG tablet Take 1 tablet (25 mg total) by mouth 2 (two) times daily with a meal. 08/15/17  Yes [name], MD  cholecalciferol (VITAMIN D) 1000 units tablet Take 1 tablet (1,000 Units total) by mouth daily. Patient taking differently: Take 1,000 Units by mouth every evening.  02/01/17  Yes [name], MD  doxazosin (CARDURA) 4 MG tablet TAKE 1 TABLET BY MOUTH ONCE DAILY 07/23/17  Yes Elayne Snare, MD  hydrOXYzine (ATARAX/VISTARIL) 50 MG tablet Take 1 tablet (50 mg total) 3 (three) times daily as needed by mouth. 03/22/17  Yes [name], MD  Icosapent Ethyl (VASCEPA) 1 g CAPS 2 caps bid 02/22/17  Yes Elayne Snare, MD  insulin regular human CONCENTRATED (HUMULIN R) 500 UNIT/ML injection USE UP TO 0.5 ML IN INSULIN PUMP daily. 04/26/17  Yes Elayne Snare, MD  isosorbide mononitrate (IMDUR) 30 MG 24 hr tablet TAKE TWO TABLETS BY MOUTH ONCE DAILY 11/07/16  Yes [name], MD  liraglutide (VICTOZA) 18 MG/3ML SOPN INJECT 0.3MLS (1.8 MG TOTAL) INTO THE SKIN DAILY 05/23/16  Yes Elayne Snare, MD  lisinopril (PRINIVIL,ZESTRIL) 5 MG tablet Take 5 mg by mouth every evening.  07/15/15  Yes [provider]  Omega-3 Fatty Acids (FISH OIL) 1000 MG CAPS Take by mouth 2 (two) times daily.   Yes [provider]  pantoprazole (PROTONIX) 40 MG tablet TAKE ONE TABLET BY MOUTH ONCE DAILY 02/07/17  Yes [name], FNP  sertraline (ZOLOFT) 100 MG tablet Take 100 mg by mouth daily.   Yes [provider]  torsemide (DEMADEX) 20 MG tablet Take 20 mg by mouth 2 (two) times daily.    Yes [provider]  traMADol (ULTRAM) 50 MG tablet Take 1-2 tablets (50-100 mg total) by mouth every 6 (six) hours as needed for moderate pain or severe pain. 03/05/17  Yes [name], MD  vitamin B-12 (CYANOCOBALAMIN) 100 MCG tablet Take 100 mcg by mouth daily.   Yes [provider]  insulin regular human CONCENTRATED (HUMULIN R) 500 UNIT/ML injection USE 0.25 ML DAILY OR AS DIRECTED IN INSULIN PUMP E11.65 Patient not taking: Reported on 08/30/2017 06/06/17   Elayne Snare, MD    Family History Family History  Problem Relation Age of Onset  . Cancer Mother        breast  . Heart attack Father  Social History Social History   Tobacco Use  . Smoking status: Former Smoker    Packs/day: 2.00    Years: 30.00    Pack years: 60.00    Last attempt to quit: 03/03/1984    Years since quitting: 33.5  . Smokeless tobacco: Former Network engineer Use Topics  . Alcohol use: Yes    Alcohol/week: 0.6 oz    Types: 1 Glasses of wine per week    Comment: social  . Drug use: No     Allergies   Adhesive [tape]   Review of Systems Review of Systems  Unable to perform ROS: Mental status change     Physical Exam Updated Vital Signs BP (!) 203/57 (BP Location: Right Arm)   Pulse (!) 59   Temp 98.9 F (37.2 C) (Oral)   Resp 14   Ht 6\' 4"  (1.93 m)   Wt 130.2 kg (287 lb)   SpO2 100%   BMI 34.93 kg/m   Physical Exam  Constitutional:  Alert and oriented x2; did not know day.  HENT:  Head: Normocephalic and atraumatic.  Eyes: Conjunctivae are normal.  Neck: Neck supple.  Cardiovascular: Normal rate and regular rhythm.  Pulmonary/Chest: Effort normal and breath sounds normal.  Abdominal: Soft. Bowel sounds are normal.  Musculoskeletal: Normal range of motion.  Neurological:   Left facial droop; weakness/numbness left arm  Skin: Skin is warm and dry.  Psychiatric:  Flat affect  Nursing note and vitals reviewed.    ED Treatments / Results  Labs (all labs ordered are listed, but only abnormal results are displayed) Labs Reviewed  CBC WITH DIFFERENTIAL/PLATELET - Abnormal; Notable for the following components:      Result Value   RBC 3.82 (*)    Hemoglobin 11.5 (*)    HCT 34.8 (*)    All other components within normal limits  CBG MONITORING, ED - Abnormal; Notable for the following components:   Glucose-Capillary 116 (*)    All other components within normal limits  BASIC METABOLIC PANEL  URINALYSIS, ROUTINE W REFLEX MICROSCOPIC    EKG None  Radiology Ct Head Wo Contrast  Result Date: 08/30/2017 CLINICAL DATA:  LEFT-sided facial droop. LEFT arm numbness. EXAM: CT HEAD WITHOUT CONTRAST TECHNIQUE: Contiguous axial images were obtained from the base of the skull through the vertex without intravenous contrast. COMPARISON:  Head CT 09/26/2016 FINDINGS: Brain: No acute intracranial hemorrhage. No focal mass lesion. No CT evidence of acute infarction. No midline shift or mass effect. No hydrocephalus. Basilar cisterns are patent. There are periventricular and subcortical white matter hypodensities. Generalized cortical atrophy. Vascular: No hyperdense vessel or unexpected calcification. Skull: Normal. Negative for fracture or focal lesion. Sinuses/Orbits: Paranasal sinuses and mastoid air cells are clear. Orbits are clear. Other: None. IMPRESSION: 1. No acute intracranial findings. 2. Atrophy and white matter microvascular disease. Electronically Signed   By: Suzy Bouchard [name]   On: 08/30/2017 21:39    Procedures Procedures (including critical care time)  Medications Ordered in ED Medications - No data to display   Initial Impression / Assessment and Plan / ED Course  I have reviewed the triage vital signs and the nursing notes.  Pertinent labs &  imaging results that were available during my care of the patient were reviewed by me and considered in my medical decision making (see chart for details).     History and physical most consistent with a CVA.  CT head shows no acute findings.  Discussed with neurologist who  will consult.   CRITICAL CARE Performed by: Nat Christen Total critical care time: 30 minutes Critical care time was exclusive of separately billable procedures and treating other patients. Critical care was necessary to treat or prevent imminent or life-threatening deterioration. Critical care was time spent personally by me on the following activities: development of treatment plan with patient and/or surrogate as well as nursing, discussions with consultants, evaluation of patient's response to treatment, examination of patient, obtaining history from patient or surrogate, ordering and performing treatments and interventions, ordering and review of laboratory studies, ordering and review of radiographic studies, pulse oximetry and re-evaluation of patient's condition.  Final Clinical Impressions(s) / ED Diagnoses   Final diagnoses:  Cerebrovascular accident (CVA), unspecified mechanism [facility])    ED Discharge Orders    None       Nat Christen, MD 08/30/17 2155    Nat Christen, MD 08/30/17 8502    Nat Christen, MD 09/16/17 [phone]

## 2017-08-30 NOTE — ED Provider Notes (Signed)
10:15 PM Patient care assumed from Dr. Lacinda Axon at shift change; pending Neurology consultation. Anticipate admission for further CVA work up.  11:45 PM Neurohospitalist at bedside.  11:58 PM Dr. Leonel Ramsay agrees with admission. Will consult TRH. Stresses importance of BP management <288 systolic and/or <337 diastolic. Neuro will continue to follow. Of note, chart references that patient does have a Medtronic pacemaker in place since 2009; generator changeout in 2017. Patient reports this was switched "specifically so I can have an MRI".  12:08 AM Dr. Alcario Drought of [place] at bedside.   Vitals:   08/30/17 2107 08/30/17 2108 08/30/17 2200 08/31/17 0000  BP:   (!) 218/67 (!) 180/57  Pulse:   65 (!) 59  Resp:    20  Temp:      TempSrc:      SpO2: 100%  98% 96%  Weight:  130.2 kg (287 lb)    Height:  6\' 4"  (1.93 m)        Antonietta Breach, PA-C 08/31/17 0009    Ripley Fraise, MD 08/31/17 [phone]

## 2017-08-30 NOTE — ED Triage Notes (Signed)
PT BIB [facility] for eval of stroke-like sx onset last night around 2000. Pt wife reports that last night something seemed "off", but unable to describe any focal neuro deficits. Wife reports pt woke up increasingly confused this AM, reported L arm weakness/numbness to wife this afternoon. Pt disclosed during triage that his L arm went numb and he was slapping at it 2 nights ago and didn't say anything at that time so as not to worry anyone. Pt reports HAx 3 days. Pt arrives w/ obvious L sided facial droop, L arm weakness and numbness. Last known well per EMS/wife was 2000 last night

## 2017-08-31 ENCOUNTER — Inpatient Hospital Stay (HOSPITAL_COMMUNITY): Payer: Medicare Other

## 2017-08-31 DIAGNOSIS — E1165 Type 2 diabetes mellitus with hyperglycemia: Secondary | ICD-10-CM | POA: Diagnosis not present

## 2017-08-31 DIAGNOSIS — F419 Anxiety disorder, unspecified: Secondary | ICD-10-CM | POA: Diagnosis present

## 2017-08-31 DIAGNOSIS — R451 Restlessness and agitation: Secondary | ICD-10-CM | POA: Diagnosis not present

## 2017-08-31 DIAGNOSIS — G4733 Obstructive sleep apnea (adult) (pediatric): Secondary | ICD-10-CM | POA: Diagnosis not present

## 2017-08-31 DIAGNOSIS — I4891 Unspecified atrial fibrillation: Secondary | ICD-10-CM | POA: Diagnosis present

## 2017-08-31 DIAGNOSIS — R29810 Facial weakness: Secondary | ICD-10-CM | POA: Diagnosis present

## 2017-08-31 DIAGNOSIS — E1122 Type 2 diabetes mellitus with diabetic chronic kidney disease: Secondary | ICD-10-CM | POA: Diagnosis not present

## 2017-08-31 DIAGNOSIS — I951 Orthostatic hypotension: Secondary | ICD-10-CM | POA: Diagnosis present

## 2017-08-31 DIAGNOSIS — Z950 Presence of cardiac pacemaker: Secondary | ICD-10-CM | POA: Diagnosis not present

## 2017-08-31 DIAGNOSIS — I10 Essential (primary) hypertension: Secondary | ICD-10-CM | POA: Diagnosis not present

## 2017-08-31 DIAGNOSIS — I69354 Hemiplegia and hemiparesis following cerebral infarction affecting left non-dominant side: Secondary | ICD-10-CM | POA: Diagnosis not present

## 2017-08-31 DIAGNOSIS — R29703 NIHSS score 3: Secondary | ICD-10-CM | POA: Diagnosis not present

## 2017-08-31 DIAGNOSIS — I5032 Chronic diastolic (congestive) heart failure: Secondary | ICD-10-CM

## 2017-08-31 DIAGNOSIS — I5022 Chronic systolic (congestive) heart failure: Secondary | ICD-10-CM | POA: Diagnosis not present

## 2017-08-31 DIAGNOSIS — I472 Ventricular tachycardia: Secondary | ICD-10-CM | POA: Diagnosis not present

## 2017-08-31 DIAGNOSIS — I959 Hypotension, unspecified: Secondary | ICD-10-CM | POA: Diagnosis not present

## 2017-08-31 DIAGNOSIS — R4189 Other symptoms and signs involving cognitive functions and awareness: Secondary | ICD-10-CM | POA: Diagnosis not present

## 2017-08-31 DIAGNOSIS — I639 Cerebral infarction, unspecified: Secondary | ICD-10-CM

## 2017-08-31 DIAGNOSIS — I248 Other forms of acute ischemic heart disease: Secondary | ICD-10-CM | POA: Diagnosis not present

## 2017-08-31 DIAGNOSIS — I69398 Other sequelae of cerebral infarction: Secondary | ICD-10-CM | POA: Diagnosis not present

## 2017-08-31 DIAGNOSIS — R410 Disorientation, unspecified: Secondary | ICD-10-CM | POA: Diagnosis not present

## 2017-08-31 DIAGNOSIS — E1142 Type 2 diabetes mellitus with diabetic polyneuropathy: Secondary | ICD-10-CM | POA: Diagnosis present

## 2017-08-31 DIAGNOSIS — E1151 Type 2 diabetes mellitus with diabetic peripheral angiopathy without gangrene: Secondary | ICD-10-CM | POA: Diagnosis not present

## 2017-08-31 DIAGNOSIS — N183 Chronic kidney disease, stage 3 (moderate): Secondary | ICD-10-CM | POA: Diagnosis not present

## 2017-08-31 DIAGNOSIS — E1149 Type 2 diabetes mellitus with other diabetic neurological complication: Secondary | ICD-10-CM | POA: Diagnosis not present

## 2017-08-31 DIAGNOSIS — N400 Enlarged prostate without lower urinary tract symptoms: Secondary | ICD-10-CM | POA: Diagnosis present

## 2017-08-31 DIAGNOSIS — E785 Hyperlipidemia, unspecified: Secondary | ICD-10-CM

## 2017-08-31 DIAGNOSIS — I152 Hypertension secondary to endocrine disorders: Secondary | ICD-10-CM | POA: Diagnosis present

## 2017-08-31 DIAGNOSIS — Z794 Long term (current) use of insulin: Secondary | ICD-10-CM

## 2017-08-31 DIAGNOSIS — E119 Type 2 diabetes mellitus without complications: Secondary | ICD-10-CM | POA: Diagnosis not present

## 2017-08-31 DIAGNOSIS — E1159 Type 2 diabetes mellitus with other circulatory complications: Secondary | ICD-10-CM | POA: Diagnosis not present

## 2017-08-31 DIAGNOSIS — I6389 Other cerebral infarction: Secondary | ICD-10-CM | POA: Diagnosis not present

## 2017-08-31 DIAGNOSIS — I421 Obstructive hypertrophic cardiomyopathy: Secondary | ICD-10-CM | POA: Diagnosis not present

## 2017-08-31 DIAGNOSIS — I130 Hypertensive heart and chronic kidney disease with heart failure and stage 1 through stage 4 chronic kidney disease, or unspecified chronic kidney disease: Secondary | ICD-10-CM | POA: Diagnosis not present

## 2017-08-31 DIAGNOSIS — R0602 Shortness of breath: Secondary | ICD-10-CM | POA: Diagnosis not present

## 2017-08-31 DIAGNOSIS — Z9641 Presence of insulin pump (external) (internal): Secondary | ICD-10-CM | POA: Diagnosis not present

## 2017-08-31 DIAGNOSIS — I422 Other hypertrophic cardiomyopathy: Secondary | ICD-10-CM | POA: Diagnosis not present

## 2017-08-31 DIAGNOSIS — R29701 NIHSS score 1: Secondary | ICD-10-CM | POA: Diagnosis not present

## 2017-08-31 DIAGNOSIS — R2689 Other abnormalities of gait and mobility: Secondary | ICD-10-CM | POA: Diagnosis not present

## 2017-08-31 DIAGNOSIS — D62 Acute posthemorrhagic anemia: Secondary | ICD-10-CM | POA: Diagnosis not present

## 2017-08-31 DIAGNOSIS — E1169 Type 2 diabetes mellitus with other specified complication: Secondary | ICD-10-CM

## 2017-08-31 DIAGNOSIS — I081 Rheumatic disorders of both mitral and tricuspid valves: Secondary | ICD-10-CM | POA: Diagnosis not present

## 2017-08-31 DIAGNOSIS — I63411 Cerebral infarction due to embolism of right middle cerebral artery: Secondary | ICD-10-CM | POA: Diagnosis not present

## 2017-08-31 DIAGNOSIS — E118 Type 2 diabetes mellitus with unspecified complications: Secondary | ICD-10-CM | POA: Diagnosis not present

## 2017-08-31 DIAGNOSIS — R29705 NIHSS score 5: Secondary | ICD-10-CM | POA: Diagnosis not present

## 2017-08-31 DIAGNOSIS — L97519 Non-pressure chronic ulcer of other part of right foot with unspecified severity: Secondary | ICD-10-CM | POA: Diagnosis present

## 2017-08-31 DIAGNOSIS — N2581 Secondary hyperparathyroidism of renal origin: Secondary | ICD-10-CM | POA: Diagnosis present

## 2017-08-31 DIAGNOSIS — I2510 Atherosclerotic heart disease of native coronary artery without angina pectoris: Secondary | ICD-10-CM | POA: Diagnosis not present

## 2017-08-31 DIAGNOSIS — Z9989 Dependence on other enabling machines and devices: Secondary | ICD-10-CM

## 2017-08-31 DIAGNOSIS — M1712 Unilateral primary osteoarthritis, left knee: Secondary | ICD-10-CM | POA: Diagnosis not present

## 2017-08-31 DIAGNOSIS — R269 Unspecified abnormalities of gait and mobility: Secondary | ICD-10-CM | POA: Diagnosis not present

## 2017-08-31 DIAGNOSIS — G8324 Monoplegia of upper limb affecting left nondominant side: Secondary | ICD-10-CM | POA: Diagnosis present

## 2017-08-31 DIAGNOSIS — I469 Cardiac arrest, cause unspecified: Secondary | ICD-10-CM | POA: Diagnosis not present

## 2017-08-31 DIAGNOSIS — R55 Syncope and collapse: Secondary | ICD-10-CM | POA: Diagnosis not present

## 2017-08-31 DIAGNOSIS — I5189 Other ill-defined heart diseases: Secondary | ICD-10-CM | POA: Diagnosis not present

## 2017-08-31 LAB — CBG MONITORING, ED
Glucose-Capillary: 99 mg/dL (ref 65–99)
Glucose-Capillary: 111 mg/dL — ABNORMAL HIGH (ref 65–99)
Glucose-Capillary: 156 mg/dL — ABNORMAL HIGH (ref 65–99)
Glucose-Capillary: 185 mg/dL — ABNORMAL HIGH (ref 65–99)
Glucose-Capillary: 202 mg/dL — ABNORMAL HIGH (ref 65–99)
Glucose-Capillary: 181 mg/dL — ABNORMAL HIGH (ref 65–99)

## 2017-08-31 LAB — HEMOGLOBIN A1C
Hgb A1c MFr Bld: 7.6 % — ABNORMAL HIGH (ref 4.8–5.6)
Mean Plasma Glucose: 171.42 mg/dL

## 2017-08-31 LAB — ECHOCARDIOGRAM COMPLETE
Height: 76 in
Weight: 4592 oz

## 2017-08-31 LAB — LIPID PANEL
Cholesterol: 165 mg/dL (ref 0–200)
HDL: 20 mg/dL — ABNORMAL LOW (ref >40)
LDL Cholesterol: 106 mg/dL — ABNORMAL HIGH (ref 0–99)
Total CHOL/HDL Ratio: 8.3 RATIO
Triglycerides: 195 mg/dL — ABNORMAL HIGH (ref <150)
VLDL: 39 mg/dL (ref 0–40)

## 2017-08-31 LAB — GLUCOSE, CAPILLARY: Glucose-Capillary: 183 mg/dL — ABNORMAL HIGH (ref 65–99)

## 2017-08-31 MED ORDER — ACETAMINOPHEN 160 MG/5ML PO SOLN: 650.0000 mg | ORAL | Status: DC | PRN

## 2017-08-31 MED ORDER — ATORVASTATIN CALCIUM 40 MG PO TABS: 40.0000 mg | ORAL_TABLET | Freq: Every day | ORAL | Status: DC

## 2017-08-31 MED ORDER — ENOXAPARIN SODIUM 40 MG/0.4ML ~~LOC~~ SOLN
40.0000 mg | SUBCUTANEOUS | Status: DC
  Administered 2017-08-31 – 2017-09-03 (×4): 40 mg via SUBCUTANEOUS
  Filled 2017-08-31 (×4): qty 0.4

## 2017-08-31 MED ORDER — ACETAMINOPHEN 650 MG RE SUPP: 650.0000 mg | RECTAL | Status: DC | PRN

## 2017-08-31 MED ORDER — PANTOPRAZOLE SODIUM 40 MG PO TBEC
40.0000 mg | DELAYED_RELEASE_TABLET | Freq: Every day | ORAL | Status: DC
  Administered 2017-08-31 – 2017-09-04 (×5): 40 mg via ORAL
  Filled 2017-08-31 (×5): qty 1

## 2017-08-31 MED ORDER — HYDROXYZINE HCL 25 MG PO TABS
50.0000 mg | ORAL_TABLET | Freq: Three times a day (TID) | ORAL | Status: DC | PRN
  Administered 2017-09-01 – 2017-09-03 (×4): 50 mg via ORAL
  Filled 2017-08-31 (×4): qty 2

## 2017-08-31 MED ORDER — SERTRALINE HCL 100 MG PO TABS
100.0000 mg | ORAL_TABLET | Freq: Every day | ORAL | Status: DC
  Administered 2017-08-31 – 2017-09-04 (×5): 100 mg via ORAL
  Filled 2017-08-31 (×5): qty 1

## 2017-08-31 MED ORDER — AMLODIPINE BESYLATE 10 MG PO TABS
10.0000 mg | ORAL_TABLET | Freq: Every day | ORAL | Status: DC
  Administered 2017-08-31 – 2017-09-02 (×3): 10 mg via ORAL
  Filled 2017-08-31 (×3): qty 1

## 2017-08-31 MED ORDER — VITAMIN B-12 100 MCG PO TABS
100.0000 ug | ORAL_TABLET | Freq: Every day | ORAL | Status: DC
  Administered 2017-09-01 – 2017-09-04 (×4): 100 ug via ORAL
  Filled 2017-08-31 (×6): qty 1

## 2017-08-31 MED ORDER — ASPIRIN 300 MG RE SUPP: 300.0000 mg | Freq: Every day | RECTAL | Status: DC

## 2017-08-31 MED ORDER — STROKE: EARLY STAGES OF RECOVERY BOOK
Freq: Once | Status: AC
  Administered 2017-08-31: 06:00:00
  Filled 2017-08-31: qty 1

## 2017-08-31 MED ORDER — ACETAMINOPHEN 325 MG PO TABS: 650.0000 mg | ORAL_TABLET | ORAL | Status: DC | PRN

## 2017-08-31 MED ORDER — TORSEMIDE 20 MG PO TABS
20.0000 mg | ORAL_TABLET | Freq: Two times a day (BID) | ORAL | Status: DC
  Administered 2017-08-31 – 2017-09-02 (×5): 20 mg via ORAL
  Filled 2017-08-31 (×5): qty 1

## 2017-08-31 MED ORDER — LORAZEPAM 2 MG/ML IJ SOLN
0.5000 mg | Freq: Four times a day (QID) | INTRAMUSCULAR | Status: DC | PRN
  Administered 2017-08-31 – 2017-09-01 (×2): 0.5 mg via INTRAVENOUS
  Filled 2017-08-31 (×2): qty 1

## 2017-08-31 MED ORDER — DOXAZOSIN MESYLATE 8 MG PO TABS
4.0000 mg | ORAL_TABLET | Freq: Every day | ORAL | Status: DC
  Administered 2017-08-31 – 2017-09-02 (×3): 4 mg via ORAL
  Filled 2017-08-31: qty 1
  Filled 2017-08-31: qty 2
  Filled 2017-08-31 (×2): qty 1

## 2017-08-31 MED ORDER — CALCITRIOL 0.25 MCG PO CAPS
0.2500 ug | ORAL_CAPSULE | Freq: Every day | ORAL | Status: DC
  Administered 2017-08-31 – 2017-09-04 (×5): 0.25 ug via ORAL
  Filled 2017-08-31 (×5): qty 1

## 2017-08-31 MED ORDER — ISOSORBIDE MONONITRATE ER 60 MG PO TB24
60.0000 mg | ORAL_TABLET | Freq: Every day | ORAL | Status: DC
  Administered 2017-08-31 – 2017-09-02 (×3): 60 mg via ORAL
  Filled 2017-08-31 (×3): qty 1

## 2017-08-31 MED ORDER — ASPIRIN 325 MG PO TABS
325.0000 mg | ORAL_TABLET | Freq: Every day | ORAL | Status: DC
  Administered 2017-08-31 – 2017-09-04 (×5): 325 mg via ORAL
  Filled 2017-08-31 (×5): qty 1

## 2017-08-31 MED ORDER — CARVEDILOL 25 MG PO TABS
25.0000 mg | ORAL_TABLET | Freq: Two times a day (BID) | ORAL | Status: DC
  Administered 2017-08-31 – 2017-09-02 (×4): 25 mg via ORAL
  Filled 2017-08-31 (×4): qty 1

## 2017-08-31 MED ORDER — LIRAGLUTIDE 18 MG/3ML ~~LOC~~ SOPN: 1.8000 mg | PEN_INJECTOR | Freq: Every day | SUBCUTANEOUS | Status: DC

## 2017-08-31 MED ORDER — INSULIN ASPART 100 UNIT/ML ~~LOC~~ SOLN
0.0000 [IU] | Freq: Three times a day (TID) | SUBCUTANEOUS | Status: DC
  Administered 2017-08-31: 3 [IU] via SUBCUTANEOUS
  Administered 2017-08-31: 5 [IU] via SUBCUTANEOUS
  Administered 2017-08-31: 3 [IU] via SUBCUTANEOUS
  Administered 2017-09-01: 5 [IU] via SUBCUTANEOUS
  Administered 2017-09-01 (×2): 3 [IU] via SUBCUTANEOUS
  Administered 2017-09-02: 5 [IU] via SUBCUTANEOUS
  Administered 2017-09-02: 3 [IU] via SUBCUTANEOUS
  Administered 2017-09-03: 5 [IU] via SUBCUTANEOUS
  Administered 2017-09-03: 2 [IU] via SUBCUTANEOUS
  Administered 2017-09-03: 5 [IU] via SUBCUTANEOUS
  Administered 2017-09-04: 2 [IU] via SUBCUTANEOUS
  Administered 2017-09-04: 3 [IU] via SUBCUTANEOUS
  Filled 2017-08-31 (×2): qty 1

## 2017-08-31 MED ORDER — LISINOPRIL 5 MG PO TABS
5.0000 mg | ORAL_TABLET | Freq: Every evening | ORAL | Status: DC
  Administered 2017-09-01: 5 mg via ORAL
  Filled 2017-08-31: qty 1

## 2017-08-31 MED ORDER — LORAZEPAM 2 MG/ML IJ SOLN: 0.5000 mg | Freq: Once | INTRAMUSCULAR | Status: AC

## 2017-08-31 MED ORDER — INSULIN GLARGINE 100 UNIT/ML ~~LOC~~ SOLN
15.0000 [IU] | Freq: Two times a day (BID) | SUBCUTANEOUS | Status: DC
  Administered 2017-08-31 – 2017-09-04 (×9): 15 [IU] via SUBCUTANEOUS
  Filled 2017-08-31 (×13): qty 0.15

## 2017-08-31 MED ORDER — SENNOSIDES-DOCUSATE SODIUM 8.6-50 MG PO TABS
1.0000 | ORAL_TABLET | Freq: Every evening | ORAL | Status: DC | PRN
  Administered 2017-09-03 – 2017-09-04 (×2): 1 via ORAL
  Filled 2017-08-31 (×2): qty 1

## 2017-08-31 MED ORDER — VITAMIN D 1000 UNITS PO TABS
1000.0000 [IU] | ORAL_TABLET | Freq: Every evening | ORAL | Status: DC
  Administered 2017-08-31 – 2017-09-03 (×4): 1000 [IU] via ORAL
  Filled 2017-08-31 (×4): qty 1

## 2017-08-31 MED ORDER — ATORVASTATIN CALCIUM 10 MG PO TABS
10.0000 mg | ORAL_TABLET | Freq: Every day | ORAL | Status: DC
  Administered 2017-08-31 – 2017-09-03 (×4): 10 mg via ORAL
  Filled 2017-08-31 (×4): qty 1

## 2017-08-31 MED ORDER — INSULIN ASPART 100 UNIT/ML ~~LOC~~ SOLN
4.0000 [IU] | Freq: Three times a day (TID) | SUBCUTANEOUS | Status: DC
  Administered 2017-08-31 – 2017-09-04 (×10): 4 [IU] via SUBCUTANEOUS
  Filled 2017-08-31 (×2): qty 1

## 2017-08-31 MED ORDER — TRAMADOL HCL 50 MG PO TABS: 50.0000 mg | ORAL_TABLET | Freq: Four times a day (QID) | ORAL | Status: DC | PRN

## 2017-08-31 MED ORDER — LORAZEPAM 2 MG/ML IJ SOLN
1.0000 mg | Freq: Once | INTRAMUSCULAR | Status: AC
  Administered 2017-08-31: 1 mg via INTRAVENOUS
  Filled 2017-08-31: qty 1

## 2017-08-31 NOTE — ED Notes (Addendum)
Dr. Rozanna Box contacted and state spt may be off monitor off floor.

## 2017-08-31 NOTE — Consult Note (Signed)
Neurology Consultation Reason for Consult: Left-sided weakness Referring Physician: Lacinda Axon, B  CC: Left-sided weakness  History is obtained from: Patient  HPI: David [name] is a 77 y.o. male with multiple medical problems including diabetes, hypertension, significant heart disease status post pacemaker placement who presents with left-sided weakness that started 2 days ago.   LKW: 4/15 tpa given?: no, out of window Premorbid modified rankin scale: 1  ROS: A 14 point ROS was performed and is negative except as noted in the HPI.   Past Medical History:  Diagnosis Date  . Anemia, iron deficiency   . Anxiety   . Arthritis   . BPH (benign prostatic hypertrophy)   . CAD (coronary artery disease)    Nonobstructive CAD per cath  . Cardiac pacemaker in situ   . CHF (congestive heart failure) ([place])   . Chronic ulcer of right foot ([place])   . CKD (chronic kidney disease), stage III ([place]) secondary to DM and HTN   nephrologist-  [name]  . Dyspnea   . History of cellulitis    right great toe 10-25-2014  . History of skin cancer   . HOCM (hypertrophic obstructive cardiomyopathy) ([place])   . Hypertension   . 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 Insulin pump in place   . OSA on CPAP   . Peripheral neuropathy    severe  . Peripheral vascular disease ([place])    bilateral lower extremities  . Rib fracture 07/24/2015  . Secondary hyperparathyroidism of renal origin ([place])   . Sinus node dysfunction (H[facility])   . Sleep apnea      Family History  Problem Relation Age of Onset  . Cancer Mother        breast  . Heart attack Father      Social History:  reports that he quit smoking about 33 years ago. He has a 60.00 pack-year smoking history. He has quit using smokeless tobacco. He reports that he drinks about 0.6 oz of alcohol per week. He reports that he does not use drugs.   Exam: Current vital signs: BP (!) 134/98    Pulse 65   Temp 98.9 F (37.2 C) (Oral)   Resp 20   Ht 6\' 4"  (1.93 m)   Wt 130.2 kg (287 lb)   SpO2 98%   BMI 34.93 kg/m  Vital signs in last 24 hours: Temp:  [98.9 F (37.2 C)] 98.9 F (37.2 C) (04/17 2046) Pulse Rate:  [59-65] 65 (04/18 0100) Resp:  [14-20] 20 (04/18 0000) BP: (134-218)/(57-168) 134/98 (04/18 0100) SpO2:  [96 %-100 %] 98 % (04/18 0100) Weight:  [130.2 kg (287 lb)] 130.2 kg (287 lb) (04/17 2108)   Physical Exam  Constitutional: Appears overweight Psych: Affect appropriate to situation Eyes: No scleral injection HENT: No OP obstrucion Head: Normocephalic.  Cardiovascular: Normal rate and regular rhythm.  Respiratory: Effort normal, non-labored breathing GI: Soft.  No distension. There is no tenderness.  Skin: WDI  Neuro: Mental Status: Patient is awake, alert, oriented to person, place, month, year, and situation. Patient is able to give a clear and coherent history. No signs of aphasia or neglect Cranial Nerves: II: Visual Fields are full. Pupils are equal, round, and reactive to light.   III,IV, VI: EOMI without ptosis or diploplia.  V: Facial sensation is symmetric to temperature VII: Facial movement is diminished on the left VIII: hearing is intact to voice X: Uvula  elevates symmetrically XI: Shoulder shrug is symmetric. XII: tongue is midline without atrophy or fasciculations.  Motor: Tone is normal. Bulk is normal.  He has 4/5 strength of the left arm and 4+/5 in the left leg Sensory: Sensation is diminished in the left  Cerebellar: He has slower movements on finger-nose-finger with mild past pointing in the left arm, no clear ataxia in the leg      I have reviewed labs in epic and the results pertinent to this consultation are: BMP-elevated creatinine at 1.95  I have reviewed the images obtained: CT head-unremarkable  Impression: 77 year old male with isolated motor symptoms most consistent with small ischemic stroke.  Given the  deficits, I suspect that this is likely small vessel as opposed to cortically based.  He did have his pacemaker changed out in 2017 with the goal of making it MRI safe(per patient).  Recommendations: 1. HgbA1c, fasting lipid panel 2. MRI, MRA  of the brain without contrast 3. Frequent neuro checks 4. Echocardiogram 5. Carotid dopplers 6. Prophylactic therapy-Antiplatelet med: Aspirin - dose 325mg  PO or 300mg  PR 7. Risk factor modification 8. Telemetry monitoring 9. PT consult, OT consult, Speech consult 10.  He is nearing the end of his permissive hypertension window, could restart antihypertensives for gentle BP lowering starting tomorrow 11.  Stroke team to follow  Roland Rack, MD [facility] [phone]  If 7pm- 7am, please page neurology on call as listed in [place].

## 2017-08-31 NOTE — Progress Notes (Signed)
STROKE TEAM PROGRESS NOTE  David [name] is a 76 y.o. male with multiple medical problems including diabetes, hypertension, significant heart disease status post pacemaker placement who presents with left-sided weakness that started 2 days ago. LKW: 4/15 tpa given?: no, out of window Premorbid modified rankin scale: 1   INTERVAL HISTORY Patient states his weakness is improving.  He just returned from MRI which I personally reviewed shows tiny punctate right frontal cortical infarct which has not been reported on the initial report but I discussed the case with neuroradiologist Dr. Yolanda Bonine who agreed  CBC:  CBC Latest Ref Rng & Units 08/30/2017 06/29/2017 03/07/2017  WBC 4.0 - 10.5 K/uL 8.6 8.2 6.8  Hemoglobin 13.0 - 17.0 g/dL 11.5(L) 11.9(L) 9.6(L)  Hematocrit 39.0 - 52.0 % 34.8(L) 35.7(L) 29.3(L)  Platelets 150 - 400 K/uL 190 207.0 150     Comprehensive Metabolic Panel:   CMP Latest Ref Rng & Units 08/30/2017 07/21/2017 06/29/2017  Glucose 65 - 99 mg/dL 136(H) 278(H) 315(H)  BUN 6 - 20 mg/dL 33(H) - 42(H)  Creatinine 0.61 - 1.24 mg/dL 1.95(H) - 2.13(H)  Sodium 135 - 145 mmol/L 139 - 137  Potassium 3.5 - 5.1 mmol/L 4.7 - 4.6  Chloride 101 - 111 mmol/L 108 - 103  CO2 22 - 32 mmol/L 23 - 26  Calcium 8.9 - 10.3 mg/dL 9.5 - 9.1  Total Protein 6.0 - 8.3 g/dL - - 6.9  Total Bilirubin 0.2 - 1.2 mg/dL - - 0.6  Alkaline Phos 39 - 117 U/L - - 101  AST 0 - 37 U/L - - 19  ALT 0 - 53 U/L - - 22   Lipid Panel:     Component Value Date/Time   CHOL 165 08/31/2017 0500   TRIG 195 (H) 08/31/2017 0500   HDL 20 (L) 08/31/2017 0500   CHOLHDL 8.3 08/31/2017 0500   VLDL 39 08/31/2017 0500   LDLCALC 106 (H) 08/31/2017 0500   HgbA1c:  Lab Results  Component Value Date   HGBA1C 7.6 (H) 08/31/2017   Dg Chest 2 View  Result Date: 08/31/2017 CLINICAL DATA:  Acute ischemic stroke EXAM: CHEST - 2 VIEW COMPARISON:  06/29/2017 FINDINGS: Stable cardiomegaly with aortic atherosclerosis. Left-sided  pacemaker apparatus projects over the left shoulder with leads in the right atrium and right ventricle unchanged in appearance. No overt pulmonary edema. Hazy appearance of the left lung base noted likely due to differential soft tissues overlying the lung bases and left basilar atelectasis. No effusion is seen on the lateral view. There is no pneumothorax. Degenerative changes are present along the dorsal spine. IMPRESSION: Cardiomegaly with aortic atherosclerosis. No active pulmonary disease. Electronically Signed   By: Ashley Royalty [name]   On: 08/31/2017 01:31   Ct Head Wo Contrast  Result Date: 08/30/2017 CLINICAL DATA:  LEFT-sided facial droop. LEFT arm numbness. EXAM: CT HEAD WITHOUT CONTRAST TECHNIQUE: Contiguous axial images were obtained from the base of the skull through the vertex without intravenous contrast. COMPARISON:  Head CT 09/26/2016 FINDINGS: Brain: No acute intracranial hemorrhage. No focal mass lesion. No CT evidence of acute infarction. No midline shift or mass effect. No hydrocephalus. Basilar cisterns are patent. There are periventricular and subcortical white matter hypodensities. Generalized cortical atrophy. Vascular: No hyperdense vessel or unexpected calcification. Skull: Normal. Negative for fracture or focal lesion. Sinuses/Orbits: Paranasal sinuses and mastoid air cells are clear. Orbits are clear. Other: None. IMPRESSION: 1. No acute intracranial findings. 2. Atrophy and white matter microvascular disease. Electronically Signed  By: Suzy Bouchard [name]   On: 08/30/2017 21:39     Vitals:   08/31/17 0615 08/31/17 0630 08/31/17 0800 08/31/17 0813  BP: (!) 171/54 (!) 169/51 (!) 154/62   Pulse: (!) 59 (!) 59  60  Resp: (!) 22 (!) 22  17  Temp:      TempSrc:      SpO2: 94% (!) 89%  98%  Weight:      Height:       PHYSICAL EXAM Pleasant elderly Caucasian male currently not in distress. . Afebrile. Head is nontraumatic. Neck is supple without bruit.    Cardiac exam no  murmur or gallop. Lungs are clear to auscultation. Distal pulses are well felt. Neurological Exam: Awake alert oriented x 3 normal speech and language. Mild left lower face asymmetry. Tongue midline. No drift. Mild diminished fine finger movements on left. Orbits right over left upper extremity. Mild left grip weak.. Normal sensation . Normal coordination.  ASSESSMENT/PLAN [name] is a 77 y.o. male with history of diabetes, hypertension, significant heart disease status post pacemaker placement  presenting with L sided weakness x 2 days.   Stroke:  2 small right frontal cortical infarcts, embolic secondary to unknown source  CT head No acute stroke. Small vessel disease. Atrophy.   MRI  2 small R frontal cortical infarcts  MRA pending   Carotid Doppler  R ok, L ended early as pt combative  2D Echo  pending   LDL 106  HgbA1c 7.6  Lovenox 40 mg sq daily for VTE prophylaxis  Fall precautions  Diet heart healthy/carb modified Room service appropriate? Yes; Fluid consistency: Thin  aspirin 81 mg daily prior to admission, now on aspirin 325 mg daily.   Therapy recommendations:  CIR. Consult placed  Disposition:  pending (at home w/ family PTA)  Hypertension  Stable . Permissive hypertension (OK if < 220/120) but gradually normalize in 5-7 days . Long-term BP goal normotensive  Hyperlipidemia  Home meds:  Fish oil, now on lipitor 10  LDL 106, goal < 70  Continue statin at discharge  Diabetes type II  HgbA1c 7.6, goal < 7.0  Uncontrolled  Other Stroke Risk Factors  Advanced age  77 / ETOH level not performed   Former Cigarette smoker  ETOH use  Obesity, Body mass index is 34.93 kg/m.  Coronary artery disease  Obstructive sleep apnea  CHF, HCOM s/p pacer  Other Active Problems  BPH  CKD III  Hospital day # 0  I have personally examined this patient, reviewed notes, independently viewed imaging studies, participated in medical  decision making and plan of care.ROS completed by me personally and pertinent positives fully documented  I have made any additions or clarifications directly to the above note. Agree with note above.  He presented with left-sided weakness for 2 days due to embolic right frontal MCA branch infarct etiology to be determined.  Recommend evaluate pacemaker for atrial fibrillation.  Check transesophageal echocardiogram for cardiac source of embolism.  Dual antiplatelet therapy of an aspirin and Plavix for 3 weeks followed by Plavix alone unless obvious reason for anticoagulation is found.  Long discussion with the patient as well as Dr. Clementeen Graham and Dr Maree Erie and onset questions.  Greater than 50% time during the 35-minute visit was spent on counseling and coordination of care about his embolic stroke and discussion about evaluation and treatment plan.  Antony Contras, MD Medical Director [facility] Pager: [phone] 08/31/2017 7:51 PM  To contact Stroke Continuity provider, please refer to [email or web address] After hours, contact General Neurology

## 2017-08-31 NOTE — Progress Notes (Signed)
08/31/17 1024  PT Visit Information  Last PT Received On 08/31/17  Assistance Needed +2  PT/OT/SLP Co-Evaluation/Treatment Yes  Reason for Co-Treatment To address functional/ADL transfers;Necessary to address cognition/behavior during functional activity  PT goals addressed during session Balance;Proper use of DME;Mobility/safety with mobility  History of Present Illness 77 y.o. 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 peripheral neuropathy who presents with left-sided weakness. CT negative for acute abnormality, however, pending MRI.   Precautions  Precautions Fall  Restrictions  Weight Bearing Restrictions No  Home Living  Family/patient expects to be discharged to: Private residence  Living Arrangements Spouse/significant other  Available Help at Discharge Family;Available 24 hours/day  Type of Home House  Home Access Stairs to enter  Entrance Stairs-Number of Steps 1  Entrance Stairs-Rails Right  Home Layout One level  Bathroom Shower/Tub Tub/shower unit;Curtain  Corporate treasurer Yes  Home Equipment [place] - 2 wheels;Cane - single point;Shower seat  Prior Function  Level of Independence Independent with assistive device(s)  Comments used cane and furniture walked; drove but wife states it was "scarey"  Communication  Communication Expressive difficulties  Pain Assessment  Pain Assessment Faces  Faces Pain Scale 0  Cognition  Arousal/Alertness Lethargic;Suspect due to medications  Behavior During Therapy Flat affect  Overall Cognitive Status Impaired/Different from baseline  Area of Impairment Orientation;Attention;Following commands;Safety/judgement;Awareness;Problem solving  Orientation Level Disoriented to;Time;Situation  Current Attention Level Sustained  Following Commands Follows one step commands inconsistently   Safety/Judgement Decreased awareness of safety;Decreased awareness of deficits  Awareness Emergent  Problem Solving Slow processing;Decreased initiation;Difficulty sequencing;Requires verbal cues;Requires tactile cues  General Comments cognition most likely impacted by medicatioin but is impaired. will further assess  Upper Extremity Assessment  Upper Extremity Assessment Defer to OT evaluation  Lower Extremity Assessment  Lower Extremity Assessment RLE deficits/detail;LLE deficits/detail  RLE Deficits / Details R toe amputations at baseline.   RLE Sensation history of peripheral neuropathy  LLE Deficits / Details Generalized weakness as compared to RLE noted.   LLE Sensation history of peripheral neuropathy  Cervical / Trunk Assessment  Cervical / Trunk Assessment Other exceptions  Cervical / Trunk Exceptions posterior bias  Bed Mobility  Overal bed mobility Needs Assistance  Bed Mobility Supine to Sit;Sit to Supine  Supine to sit Min assist  Sit to supine Min guard  General bed mobility comments initially posterior lean  Transfers  Overall transfer level Needs assistance  Equipment used Rolling walker (2 wheeled)  Transfers Sit to/from Stand  Sit to Stand Mod assist;+2 physical assistance  General transfer comment poor use of RW; no knee buskling noted; poor proprioception of LLE; hx of peripheral neuropathy; did not follow commands for safe use of RW. Performed X 2, as pt abruptly sitting on first attempt.   Ambulation/Gait  General Gait Details Pt with poor sequencing and unable to follow commands to take side steps at EOB. Pt with decreased awareness of RUE and unable to maintain placement on RW.   Modified Rankin (Stroke Patients Only)  Pre-Morbid Rankin Score 1  Modified Rankin 4  Balance  Overall balance assessment Needs assistance  Sitting-balance support No upper extremity supported;Feet supported  Sitting balance-Leahy Scale Fair  Postural control Posterior lean   Standing balance support Bilateral upper extremity supported;During functional activity  Standing balance-Leahy Scale Poor  Standing balance comment Reliant on BUE support and external support.   General Comments  General  comments (skin integrity, edema, etc.) Pt's wife and daughter present during session.   PT - End of Session  Equipment Utilized During Treatment Gait belt  Activity Tolerance Patient tolerated treatment well  Patient left in bed;with call bell/phone within reach;Other (comment);with family/visitor present (transport staff in room )  Nurse Communication Mobility status  PT Assessment  PT Recommendation/Assessment Patient needs continued PT services  PT Visit Diagnosis Unsteadiness on feet (R26.81);Other abnormalities of gait and mobility (R26.89);Muscle weakness (generalized) (M62.81);Difficulty in walking, not elsewhere classified (R26.2);Other symptoms and signs involving the nervous system (R29.898)  PT Problem List Decreased strength;Decreased balance;Decreased mobility;Decreased cognition;Decreased coordination;Decreased knowledge of use of DME;Decreased knowledge of precautions;Decreased safety awareness  PT Plan  PT Frequency (ACUTE ONLY) Min 4X/week  PT Treatment/Interventions (ACUTE ONLY) DME instruction;Gait training;Stair training;Functional mobility training;Therapeutic activities;Therapeutic exercise;Balance training;Neuromuscular re-education;Patient/family education;Cognitive remediation  AM-PAC PT "6 Clicks" Daily Activity Outcome Measure  Difficulty turning over in bed (including adjusting bedclothes, sheets and blankets)? 2  Difficulty moving from lying on back to sitting on the side of the bed?  1  Difficulty sitting down on and standing up from a chair with arms (e.g., wheelchair, bedside commode, etc,.)? 1  Help needed moving to and from a bed to chair (including a wheelchair)? 2  Help needed walking in hospital room? 2  Help needed climbing 3-5 steps  with a railing?  1  6 Click Score 9  Mobility G Code  CL  PT Recommendation  Recommendations for Other Services Rehab consult  Follow Up Recommendations CIR  PT equipment None recommended by PT  Individuals Consulted  Consulted and Agree with Results and Recommendations Patient;Family member/caregiver  Family Member Consulted daughter and wife   Acute Rehab PT Goals  Patient Stated Goal per family to get better  PT Goal Formulation With family  Time For Goal Achievement 09/14/17  Potential to Achieve Goals Good  PT Time Calculation  PT Start Time (ACUTE ONLY) 0840  PT Stop Time (ACUTE ONLY) 0903  PT Time Calculation (min) (ACUTE ONLY) 23 min  PT General Charges  $$ ACUTE PT VISIT 1 Visit  PT Evaluation  $PT Eval Moderate Complexity 1 Mod  Written Expression  Dominant Hand Right   Pt admitted secondary to problem above and deficits below. Pt with poor safety awareness, L extremity weakness, decreased coordination, and L sided inattention. Required mod A +2 to stand at EOB, and unable to take steps at EOB secondary to difficulty sequencing. Feel pt is currently a high fall risk. Feel pt is good candidate for CIR given current deficits, as pt was previously mod I. Pt with good family support at home. Will continue to follow acutely to maximize functional mobility independence and safety.   Leighton Ruff, PT, DPT  Acute Rehabilitation Services  Pager: [phone]

## 2017-08-31 NOTE — ED Notes (Signed)
David [name], wife, Cell [phone]

## 2017-08-31 NOTE — ED Notes (Addendum)
Pt's breakfast tray arrived 

## 2017-08-31 NOTE — Progress Notes (Signed)
PROGRESS NOTE                                                                                                                                                                                                             Patient Demographics:    David [name], is a 77 y.o. male, DOB - [birth year], AOZ:[id number]  Admit date - 08/30/2017   Admitting Physician No admitting provider for patient encounter.  Outpatient Primary MD for the patient is [name], MD  LOS - 0  Outpatient Specialists: cardiology  Chief Complaint  Patient presents with  . 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 Patient admitted for stroke workup.   Subjective:   Patient was asleep when I walked into the room.  On waking up reports minimal weakness in his left hand, no speech impairment.   Assessment  & Plan :    Principal Problem:   Acute ischemic stroke [facility]) Neurology consult appreciated, suspect small vessel disease.  On baby aspirin at home, switch to full dose aspirin.  Added Lipitor (LDL of 106).  A1c of 7.6. Check  MRI brain/MRA head (patient's pacemaker is MRI compatible), check 2D echo and carotid Doppler. PT/OT eval.  SLP eval. Allow permissive blood pressure for now.   Active Problems:    Hyperlipidemia associated with type 2 diabetes mellitus (HCC) LDL of 106 (goal <70), added Lipitor.    CKD stage 3 due to type 2 diabetes mellitus (HCC) Renal function stable.  (Baseline creatinine around 2)   Diabetes mellitus with neurological manifestations, uncontrolled (HCC) A1c of 7.6.  Currently on sliding scale coverage.  On insulin pump at home.  Hypertension associated with diabetes mellitus On multiple medications including amlodipine, lisinopril, Imdur, Coreg and torsemide.  Hypertensive on presentation.  Allow permissive blood pressure.    OSA on CPAP  Obesity (BMI 34.9) Needs counseling on diet and exercise.    Code Status : Full code  Family Communication  :  Wife and daughter at bedside  Disposition Plan  : Pending stroke workup, possibly home tomorrow  Barriers For Discharge : Active symptoms  Consults  : Stroke  Procedures  : CT head  DVT Prophylaxis  :  Lovenox -  Lab Results  Component Value Date   PLT 190 08/30/2017    Antibiotics  :   Anti-infectives (From admission, onward)   None        Objective:   Vitals:   08/31/17 0615 08/31/17 0630 08/31/17 0800 08/31/17 0813  BP: (!) 171/54 (!) 169/51 (!) 154/62   Pulse: (!) 59 (!) 59  60  Resp: (!) 22 (!) 22  17  Temp:      TempSrc:      SpO2: 94% (!) 89%  98%  Weight:      Height:        Wt Readings from Last 3 Encounters:  08/30/17 130.2 kg (287 lb)  08/17/17 130.2 kg (287 lb)  07/24/17 131.3 kg (289 lb 6.4 oz)     Intake/Output Summary (Last 24 hours) at 08/31/2017 0848 Last data filed at 08/31/2017 6712 Gross per 24 hour  Intake -  Output 425 ml  Net -425 ml     Physical Exam  Gen: not in distress HEENT: Pupils reactive bilaterally, EOMI no pallor, moist mucosa, supple neck Chest: clear b/l, no added sounds CVS: N S1&S2, no murmurs, rubs or gallop GI: soft, NT, ND, BS+ Musculoskeletal: warm, no edema, righ amputated right toes CNS: Alert and oriented, normal tone and strength in all extremities, normal sensation.    Data Review:    CBC Recent Labs  Lab 08/30/17 2057  WBC 8.6  HGB 11.5*  HCT 34.8*  PLT 190  MCV 91.1  MCH 30.1  MCHC 33.0  RDW 14.7  LYMPHSABS 1.6    MONOABS 0.8  EOSABS 0.2  BASOSABS 0.0    Chemistries  Recent Labs  Lab 08/30/17 2057  NA 139  K 4.7  CL 108  CO2 23  GLUCOSE 136*  BUN 33*  CREATININE 1.95*  CALCIUM 9.5   ------------------------------------------------------------------------------------------------------------------ Recent Labs    08/31/17 0500  CHOL 165  HDL 20*  LDLCALC 106*  TRIG 195*  CHOLHDL 8.3    Lab Results  Component Value Date   HGBA1C 7.6 (H) 08/31/2017   ------------------------------------------------------------------------------------------------------------------ No results for input(s): TSH, T4TOTAL, T3FREE, THYROIDAB in the last 72 hours.  Invalid input(s): FREET3 ------------------------------------------------------------------------------------------------------------------ No results for input(s): VITAMINB12, FOLATE, FERRITIN, TIBC, IRON, RETICCTPCT in the last 72 hours.  Coagulation profile No results for input(s): INR, PROTIME in the last 168 hours.  No results for input(s): DDIMER in the last 72 hours.  Cardiac Enzymes No results for input(s): CKMB, TROPONINI, MYOGLOBIN in the last 168 hours.  Invalid input(s): CK ------------------------------------------------------------------------------------------------------------------ No results found for: BNP  Inpatient Medications  Scheduled Meds: . amLODipine  10 mg Oral Daily  . aspirin  300 mg Rectal Daily   Or  . aspirin  325 mg Oral Daily  . atorvastatin  10 mg Oral q1800  . calcitRIOL  0.25 mcg Oral Daily  . carvedilol  25 mg Oral BID WC  . cholecalciferol  1,000 Units Oral QPM  . doxazosin  4 mg Oral Daily  . enoxaparin (LOVENOX) injection  40 mg Subcutaneous Q24H  . insulin aspart  0-15 Units Subcutaneous TID WC  . insulin aspart  4 Units Subcutaneous TID WC  . insulin glargine  15 Units Subcutaneous BID  . isosorbide mononitrate  60 mg Oral Daily  . lisinopril  5 mg Oral QPM  . pantoprazole  40 mg  Oral Daily  . sertraline  100 mg Oral Daily  . torsemide  20 mg Oral BID  . vitamin B-12  100 mcg Oral Daily   Continuous Infusions: PRN Meds:.acetaminophen **OR** acetaminophen (TYLENOL) oral liquid 160 mg/5 mL **OR** acetaminophen, hydrOXYzine, senna-docusate, traMADol  Micro Results No results found for this or any previous visit (from the past 240 hour(s)).  Radiology Reports Dg Chest 2 View  Result Date: 08/31/2017 CLINICAL DATA:  Acute ischemic stroke EXAM: CHEST - 2 VIEW COMPARISON:  06/29/2017 FINDINGS: Stable cardiomegaly with aortic atherosclerosis. Left-sided pacemaker apparatus projects over the left shoulder with leads in the right atrium and right ventricle unchanged in appearance. No overt pulmonary edema. Hazy appearance of the left lung base noted likely due to differential soft tissues overlying the lung bases and left basilar atelectasis. No effusion is seen on the lateral view. There is no pneumothorax. Degenerative changes are present along the dorsal spine. IMPRESSION: Cardiomegaly with aortic atherosclerosis. No active pulmonary disease. Electronically Signed   By: Ashley Royalty [name]   On: 08/31/2017 01:31   Ct Head Wo Contrast  Result Date: 08/30/2017 CLINICAL DATA:  LEFT-sided facial droop. LEFT arm numbness. EXAM: CT HEAD WITHOUT CONTRAST TECHNIQUE: Contiguous axial images were obtained from the base of the skull through the vertex without intravenous contrast. COMPARISON:  Head CT 09/26/2016 FINDINGS: Brain: No acute intracranial hemorrhage. No focal mass lesion. No CT evidence of acute infarction. No midline shift or mass effect. No hydrocephalus. Basilar cisterns are patent. There are periventricular and subcortical white matter hypodensities. Generalized cortical atrophy. Vascular: No hyperdense vessel or unexpected calcification. Skull: Normal. Negative for fracture or focal lesion. Sinuses/Orbits: Paranasal sinuses and mastoid air cells are clear. Orbits are clear.  Other: None. IMPRESSION: 1. No acute intracranial findings. 2. Atrophy and white matter microvascular disease. Electronically Signed   By: Suzy Bouchard [name]   On: 08/30/2017 21:39    Time Spent in minutes  20   Ninnie Fein M.D on 08/31/2017 at 8:48 AM  Between 7am to 7pm - Pager - [phone]  After 7pm go to [email or web address] - password [email or web address]  [facility] -  Office  [phone]

## 2017-08-31 NOTE — Progress Notes (Signed)
Preliminary notes by tech-- Carotid duplex study limited due to patient was combative.    Hongying Jakarri Lesko (RDMS RVT) 08/31/17 11:38 AM

## 2017-08-31 NOTE — ED Notes (Signed)
Pt trying to get out of bed. Family at bedside. Millie - RN aware.

## 2017-08-31 NOTE — ED Notes (Signed)
Pt returms fromUS family at bedsdie and pt has snoring respirations.

## 2017-08-31 NOTE — ED Notes (Addendum)
Pt becoming more agitated and difficult to keep in bed. Constant redirection required. Dr. [place] Pink contacted with info and BP.NS started at 50cc /h per VO.

## 2017-08-31 NOTE — ED Notes (Signed)
Pt's heart healthy/Carb Mod dinner tray ordered.

## 2017-08-31 NOTE — Progress Notes (Signed)
Patient wearing home CPAP when RT entered the room. Patient resting comfortably with no respiratory distress noted. RT will monitor as needed.

## 2017-08-31 NOTE — H&P (Signed)
History and Physical    David [name] TSV:[id number] DOB: [birth year] DOA: 08/30/2017  PCP: [name], MD  Patient coming from: Home  I have personally briefly reviewed patient's old medical records in [place]  Chief Complaint: Stroke  HPI: David [name] is a 77 y.o. male with medical history significant of DM2 with diabetic neuropathy, CKD stage 3, HTN, HLD.  Patient is on insulin pump at baseline managed by Dr. Dwyane Dee.  Patient presents to ED with L arm weakness / numbness.  Had episode of confusion / speech difficulty x2 nights ago.  Didn't initially report symptoms to wife.  Since then has had persistent L arm numbness and weakness.  L facial droop noted today on exam.  Patient brought in to ED after he told his family.   ED Course: BP running high 180/60.  Did take all BP meds this am.  Patient fairly obviously having L sided focal neuro deficits including L facial droop. L arm numbness / weakness.  CT head neg.  Neuro consulted and hospitalist asked to admit.   Review of Systems: As per HPI otherwise 10 point review of systems negative.   Past Medical History:  Diagnosis Date  . Anemia, iron deficiency   . Anxiety   . Arthritis   . BPH (benign prostatic hypertrophy)   . CAD (coronary artery disease)    Nonobstructive CAD per cath  . Cardiac pacemaker in situ   . CHF (congestive heart failure) ([place])   . Chronic ulcer of right foot ([place])   . CKD (chronic kidney disease), stage III ([place]) secondary to DM and HTN   nephrologist-  [name]  . Dyspnea   . History of cellulitis    right great toe 10-25-2014  . History of skin cancer   . HOCM (hypertrophic obstructive cardiomyopathy) ([place])   . Hypertension   . Insulin dependent type 2 diabetes mellitus ([place]) 1991   followd by [name]--  has insulin pump  . Insulin pump in place   . OSA on CPAP   . Peripheral neuropathy    severe  . Peripheral vascular disease ([place])    bilateral lower extremities  .  Rib fracture 07/24/2015  . Secondary hyperparathyroidism of renal origin ([place])   . Sinus node dysfunction ([facility])   . Sleep apnea     Past Surgical History:  Procedure Laterality Date  . AMPUTATION OF REPLICATED TOES  Mar 9233   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)  . AMPUTATION TOE Right 03/12/2015   Procedure: RIGHT HALLUS AMPUTATION ;  Surgeon: [name], MD;  Location: [facility];  Service: Podiatry;  Laterality: Right;  . CARDIAC CATHETERIZATION  11-25-2010   [place], [place]   Nonobstructive CAD  . CARDIAC PACEMAKER PLACEMENT  Nov 2009   [employer]tr[employer]  . CHOLECYSTECTOMY N/A 03/05/2017   Procedure: LAPAROSCOPIC CHOLECYSTECTOMY WITH INTRAOPERATIVE CHOLANGIOGRAM;  Surgeon: [name], MD;  Location: [facility];  Service: General;  Laterality: N/A;  . EP IMPLANTABLE DEVICE N/A 06/03/2015   Procedure: PPM Generator Changeout;  Surgeon: [name], MD;  Location: [facility];  Service: Cardiovascular;  Laterality: N/A;  . EXCISION BONE CYST Right 03/06/2015   Procedure: BONE BIOPSIES OF RIGHT FOOT;  Surgeon: [name], MD;  Location: [facility];  Service: Podiatry;  Laterality: Right;  . ORIF ANKLE FRACTURE Left 11/06/2014   Procedure: OPEN REDUCTION INTERNAL FIXATION (ORIF) LEFT  ANKLE FRACTURE;  Surgeon: [name], MD;  Location: [facility];  Service: Orthopedics;  Laterality: Left;  . TOTAL KNEE ARTHROPLASTY    . VEIN LIGATION AND STRIPPING       reports that he quit smoking about 33 years ago. He has a 60.00 pack-year smoking history. He has quit using smokeless tobacco. He reports that he drinks about 0.6 oz of alcohol per week. He reports that he does not use drugs.  Allergies  Allergen Reactions  . Adhesive [Tape]     blisters    Family History  Problem Relation Age of Onset  . Cancer Mother        breast  . Heart attack Father      Prior to Admission medications   Medication Sig Start Date End Date Taking? Authorizing  Provider  amLODipine (NORVASC) 10 MG tablet Take 1 tablet (10 mg total) by mouth daily. 04/28/17  Yes [name], MD  aspirin EC 81 MG tablet Take 81 mg by mouth daily.   Yes [provider]  calcitRIOL (ROCALTROL) 0.25 MCG capsule Take 0.25 mcg by mouth daily.  05/30/14  Yes [provider]  carvedilol (COREG) 25 MG tablet Take 1 tablet (25 mg total) by mouth 2 (two) times daily with a meal. 08/15/17  Yes [name], MD  cholecalciferol (VITAMIN D) 1000 units tablet Take 1 tablet (1,000 Units total) by mouth daily. Patient taking differently: Take 1,000 Units by mouth every evening.  02/01/17  Yes [name], MD  doxazosin (CARDURA) 4 MG tablet TAKE 1 TABLET BY MOUTH ONCE DAILY 07/23/17  Yes Elayne Snare, MD  hydrOXYzine (ATARAX/VISTARIL) 50 MG tablet Take 1 tablet (50 mg total) 3 (three) times daily as needed by mouth. 03/22/17  Yes [name], MD  Icosapent Ethyl (VASCEPA) 1 g CAPS 2 caps bid 02/22/17  Yes Elayne Snare, MD  insulin regular human CONCENTRATED (HUMULIN R) 500 UNIT/ML injection USE UP TO 0.5 ML IN INSULIN PUMP daily. 04/26/17  Yes Elayne Snare, MD  isosorbide mononitrate (IMDUR) 30 MG 24 hr tablet TAKE TWO TABLETS BY MOUTH ONCE DAILY 11/07/16  Yes [name], MD  liraglutide (VICTOZA) 18 MG/3ML SOPN INJECT 0.3MLS (1.8 MG TOTAL) INTO THE SKIN DAILY 05/23/16  Yes Elayne Snare, MD  lisinopril (PRINIVIL,ZESTRIL) 5 MG tablet Take 5 mg by mouth every evening.  07/15/15  Yes [provider]  Omega-3 Fatty Acids (FISH OIL) 1000 MG CAPS Take by mouth 2 (two) times daily.   Yes [provider]  pantoprazole (PROTONIX) 40 MG tablet TAKE ONE TABLET BY MOUTH ONCE DAILY 02/07/17  Yes [name], FNP  sertraline (ZOLOFT) 100 MG tablet Take 100 mg by mouth daily.   Yes [provider]  torsemide (DEMADEX) 20 MG tablet Take 20 mg by mouth 2 (two) times daily.    Yes [provider]  traMADol (ULTRAM) 50 MG tablet Take 1-2 tablets  (50-100 mg total) by mouth every 6 (six) hours as needed for moderate pain or severe pain. 03/05/17  Yes [name], MD  vitamin B-12 (CYANOCOBALAMIN) 100 MCG tablet Take 100 mcg by mouth daily.   Yes [provider]  insulin regular human CONCENTRATED (HUMULIN R) 500 UNIT/ML injection USE 0.25 ML DAILY OR AS DIRECTED IN INSULIN PUMP E11.65 Patient not taking: Reported on 08/30/2017 06/06/17   Elayne Snare, MD    Physical Exam: Vitals:   08/30/17 2107 08/30/17 2108 08/30/17 2200 08/31/17 0000  BP:   (!) 218/67 (!) 180/57  Pulse:   65 Marland Kitchen)  59  Resp:    20  Temp:      TempSrc:      SpO2: 100%  98% 96%  Weight:  130.2 kg (287 lb)    Height:  6\' 4"  (1.93 m)      Constitutional: NAD, calm, comfortable Eyes: PERRL, lids and conjunctivae normal ENMT: Mucous membranes are moist. Posterior pharynx clear of any exudate or lesions.Normal dentition.  Neck: normal, supple, no masses, no thyromegaly Respiratory: clear to auscultation bilaterally, no wheezing, no crackles. Normal respiratory effort. No accessory muscle use.  Cardiovascular: Regular rate and rhythm, no murmurs / rubs / gallops. No extremity edema. 2+ pedal pulses. No carotid bruits.  Abdomen: no tenderness, no masses palpated. No hepatosplenomegaly. Bowel sounds positive.  Musculoskeletal: no clubbing / cyanosis. No joint deformity upper and lower extremities. Good ROM, no contractures. Normal muscle tone.  Skin: no rashes, lesions, ulcers. No induration Neurologic: LUE weakness and numbness.  L facial droop. Psychiatric: Flat affect, oriented to self, location, didn't know day.   Labs on Admission: I have personally reviewed following labs and imaging studies  CBC: Recent Labs  Lab 08/30/17 2057  WBC 8.6  NEUTROABS 6.0  HGB 11.5*  HCT 34.8*  MCV 91.1  PLT 962   Basic Metabolic Panel: Recent Labs  Lab 08/30/17 2057  NA 139  K 4.7  CL 108  CO2 23  GLUCOSE 136*  BUN 33*  CREATININE 1.95*  CALCIUM 9.5    GFR: Estimated Creatinine Clearance: 46.8 mL/min (A) (by C-G formula based on SCr of 1.95 mg/dL (H)). Liver Function Tests: No results for input(s): AST, ALT, ALKPHOS, BILITOT, PROT, ALBUMIN in the last 168 hours. No results for input(s): LIPASE, AMYLASE in the last 168 hours. No results for input(s): AMMONIA in the last 168 hours. Coagulation Profile: No results for input(s): INR, PROTIME in the last 168 hours. Cardiac Enzymes: No results for input(s): CKTOTAL, CKMB, CKMBINDEX, TROPONINI in the last 168 hours. BNP (last 3 results) No results for input(s): PROBNP in the last 8760 hours. HbA1C: No results for input(s): HGBA1C in the last 72 hours. CBG: Recent Labs  Lab 08/30/17 2048  GLUCAP 116*   Lipid Profile: No results for input(s): CHOL, HDL, LDLCALC, TRIG, CHOLHDL, LDLDIRECT in the last 72 hours. Thyroid Function Tests: No results for input(s): TSH, T4TOTAL, FREET4, T3FREE, THYROIDAB in the last 72 hours. Anemia Panel: No results for input(s): VITAMINB12, FOLATE, FERRITIN, TIBC, IRON, RETICCTPCT in the last 72 hours. Urine analysis:    Component Value Date/Time   COLORURINE STRAW (A) 08/30/2017 2206   APPEARANCEUR CLEAR 08/30/2017 2206   LABSPEC 1.010 08/30/2017 2206   PHURINE 5.0 08/30/2017 2206   GLUCOSEU NEGATIVE 08/30/2017 2206   GLUCOSEU NEGATIVE 03/03/2015 1501   HGBUR SMALL (A) 08/30/2017 2206   [place] 08/30/2017 2206   KETONESUR NEGATIVE 08/30/2017 2206   PROTEINUR 100 (A) 08/30/2017 2206   UROBILINOGEN 0.2 03/03/2015 1501   NITRITE NEGATIVE 08/30/2017 2206   LEUKOCYTESUR NEGATIVE 08/30/2017 2206    Radiological Exams on Admission: Ct Head Wo Contrast  Result Date: 08/30/2017 CLINICAL DATA:  LEFT-sided facial droop. LEFT arm numbness. EXAM: CT HEAD WITHOUT CONTRAST TECHNIQUE: Contiguous axial images were obtained from the base of the skull through the vertex without intravenous contrast. COMPARISON:  Head CT 09/26/2016 FINDINGS: Brain: No  acute intracranial hemorrhage. No focal mass lesion. No CT evidence of acute infarction. No midline shift or mass effect. No hydrocephalus. Basilar cisterns are patent. There are periventricular and subcortical white  matter hypodensities. Generalized cortical atrophy. Vascular: No hyperdense vessel or unexpected calcification. Skull: Normal. Negative for fracture or focal lesion. Sinuses/Orbits: Paranasal sinuses and mastoid air cells are clear. Orbits are clear. Other: None. IMPRESSION: 1. No acute intracranial findings. 2. Atrophy and white matter microvascular disease. Electronically Signed   By: Suzy Bouchard [name]   On: 08/30/2017 21:39    EKG: Independently reviewed.  Assessment/Plan Principal Problem:   Acute ischemic stroke [facility]) Active Problems:   Hypertension associated with diabetes ([place])   Hyperlipidemia associated with type 2 diabetes mellitus ([place])   CKD stage 3 due to type 2 diabetes mellitus ([place])   Diabetes mellitus with neurological manifestations, uncontrolled (HCC)   OSA on CPAP   Pacemaker   HOCM (hypertrophic obstructive cardiomyopathy) (HCC)   IDDM (insulin dependent diabetes mellitus) - on insulin pump    1. Acute ischemic stroke - 1. Stroke pathway 2. Neuro consult 3. ASA 325 till stroke team decides otherwise 4. 2d echo 5. Carotid dopplers 6. MRI (see PPM discussion below) 7. PT/OT/SLP 2. HTN - 1. Out of window for permissive HTN, Dr. Leonel Ramsay says we can just resume BP meds in the AM (so we will do so). 3. HLD - 1. Got taken off of lipitor 20 and gemfibrozil some years ago due to elevated LFTs (which later normalizied), now just on fish oil 2. LDL 70s, HDL 25. 3. Will try patient on lipitor 10mg  4. Needs follow up to watch LFTs 4. IDDM - 1. Hold insulin pump 2. Lantus 15 BID 3. Mod scale SSI AC 4. 4 mealtime 5. OSA - continue CPAP 6. HOCM with PPM - 1. Apparently IS MRI compatible model family reports 2. MRI ordered by neurology for AM 7. CKD  stage 3 - chronic and baseline  DVT prophylaxis: Lovenox Code Status: Full Family Communication: Family at bedside Disposition Plan: Home after admit Consults called: Neuro Dr. Leonel Ramsay Admission status: Admit to inpatient   Etta Quill DO [facility] Pager [phone]  If 7AM-7PM, please contact day team taking care of patient [email or web address] Password [facility]  08/31/2017, 12:28 AM

## 2017-08-31 NOTE — ED Notes (Signed)
[place] from MRI states the [employer] rep and nurse to mionitor is in MRI.

## 2017-08-31 NOTE — Progress Notes (Signed)
Rehab Admissions Coordinator Note:  Patient was screened by Cleatrice Burke for appropriateness for an Inpatient Acute Rehab Consult per OT rec.  At this time, we are recommending Inpatient Rehab consult. Please place order for consult if pt would like to be considered for admit.   Cleatrice Burke 08/31/2017, 10:28 AM  I can be reached at [phone].

## 2017-08-31 NOTE — ED Notes (Signed)
Pt increasingly agitated, up at edge of bed all night, non redirectable. Pt continues to rip off EKG leads, has now removed IV. MD Alcario Drought made aware. Ordered 1 mg ativan IV, will admin pending new IV placement

## 2017-08-31 NOTE — ED Notes (Signed)
Pt returns from [name] son at bedsdiePt requires repeated redirection

## 2017-08-31 NOTE — Progress Notes (Signed)
  Echocardiogram 2D Echocardiogram has been performed.  David [name] 08/31/2017, 2:38 PM

## 2017-08-31 NOTE — ED Notes (Signed)
Insulin pump d/c'd per MD Alcario Drought orders, recheck BG @ 0200

## 2017-08-31 NOTE — ED Notes (Signed)
Pt's CBG result was 181. Informed Millie - RN.

## 2017-08-31 NOTE — ED Notes (Signed)
Pt's CBG result was 185. Informed Millie - RN.

## 2017-08-31 NOTE — Progress Notes (Addendum)
Inpatient Diabetes Program Recommendations  AACE/ADA: New Consensus Statement on Inpatient Glycemic Control (2015)  Target Ranges:  Prepandial:   less than 140 mg/dL      Peak postprandial:   less than 180 mg/dL (1-2 hours)      Critically ill patients:  140 - 180 mg/dL   Results for David [name], David [name] (MRN [id number]) as of 08/31/2017 09:53  Ref. Range 08/30/2017 20:48 08/31/2017 01:07 08/31/2017 02:49 08/31/2017 05:53 08/31/2017 07:42  Glucose-Capillary Latest Ref Range: 65 - 99 mg/dL 116 (H) 99 111 (H) 156 (H) 185 (H)  Results for David [name], David [name] (MRN [id number]) as of 08/31/2017 09:53  Ref. Range 05/23/2017 13:32 08/31/2017 05:00  Hemoglobin A1C Latest Ref Range: 4.8 - 5.6 % 8.1 (H) 7.6 (H)   Review of Glycemic Control  Diabetes history: DM2 Outpatient Diabetes medications: Medtronic insulin pump with Humulin R U500 (concentrated insulin), Victoza 1.8 mg daily Current orders for Inpatient glycemic control: Lantus 15 units BID, Novolog 0-15 units TID, Novolog 4 units TID with meals for meal coverage  Inpatient Diabetes Program Recommendations: Insulin-Correction: Please consider ordering Novolog 0-5 units QHS for bedtime correction scale. HgbA1C: A1C 7.6% on 08/31/17 indicating an average glucose of 171 mg/dl over the past 2-3 months.  NOTE: Received call from RN to discuss insulin orders. Per RN, patient's insulin pump was removed last night, fasting glucose 185 mg/dl, patient is off unit in MRI at this time. Discussed that patient has DM2 and uses an insulin pump with Humulin R U500 insulin as an outpatient. Asked that insulin be given as ordered since patient does not have on his insulin pump.   In reviewing chart, noted patient is followed by Dr. Dwyane Dee and was last seen 07/24/2017 by Dr. Dwyane Dee.  Per office note on 07/24/2017 by Dr. Dwyane Dee patient's insulin pump settings should be: INSULIN PUMP regimen with U-500 insulin is as follows BASAL rates: 12A  0.8 units/hr  5 A 1.2 units/hr 12 P 0.9  units/hr 9 P 0.8 units/hr Total basal: 22.90 units/24 hours  Insulin to Carb ratio: 1:20 (1 unit covers 20 grams of carbohydrates) Insulin Sensitivity Factor: 1:45 (1 unit drops glucose 45 mg/dl) Target Glucose 120-150 mg/dl  Addendum 08/31/17@12 :13-Spoke with patient regarding insulin pump and DM control at home. Patient is lying in bed with CPAP on and his son is at the bedside. Patient confirms that he does not have on his insulin pump at this time. Patient states that usually he uses his Medtronic insulin pump filled with Humulin R U500 insulin for DM control as an outpatient. Patient states that his glucose runs from 55-low 200's mg/dl at home. Inquired about hypoglycemic frequency and patient states that he experiences hypoglycemia at least 4-5 times a week and that it tends to happen more in the mornings. Inquired about whether he has discussed hypoglycemia frequency with Dr. Dwyane Dee and patient stated "No, I haven't really talked to him about it." Explained that if he is experiencing hypoglycemia this frequently, he needs to have his insulin pump settings adjusted. Encouraged patient to contact Dr. Dwyane Dee and make him aware and see what changes need to be made with insulin pump settings. Patient reports that he feels he will be able to resume his insulin pump as an outpatient. Asked to review insulin pump settings and patient's son provided insulin pump from patient's belongings. Setting reviewed and insulin pump settings are actually as noted above. Placed insulin pump back in patient's belongings bag. Explained current insulin orders and informed patient that  he will be receiving SQ insulin injections while inpatient. Patient verbalized understanding of information and reports that he has no further questions at this time.  Thanks, Barnie Alderman, RN, MSN, CDE Diabetes Coordinator Inpatient Diabetes Program [phone] (Team Pager from 8am to 5pm)

## 2017-08-31 NOTE — ED Notes (Signed)
Pt's CBG result was 202. Informed Millie - RN.

## 2017-08-31 NOTE — ED Notes (Signed)
Per MRI, pt w/ medtronic pacemaker that is required to be adjusted by medtronic rep. Unable to do MRI until AM

## 2017-08-31 NOTE — ED Notes (Signed)
Pt to MRI on monitor.

## 2017-08-31 NOTE — Progress Notes (Signed)
RT to room to set up CPAP for pt. Pt wearing his home CPAP at this time. Advised pt to have RT called if any further assistance is needed.

## 2017-08-31 NOTE — Progress Notes (Addendum)
Received report from [place]. Patient arrived to room with belongings and family at bedside.  Vitals stable.  Patient disoriented, continual attempt to reorient.  Continue to monitor patient.

## 2017-08-31 NOTE — ED Notes (Signed)
Pt's lunch tray arrived. 

## 2017-08-31 NOTE — ED Notes (Signed)
Gave pt applesauce, per Millie - RN.

## 2017-08-31 NOTE — Progress Notes (Signed)
    [facility] has been requested to perform a transesophageal echocardiogram on 09/01/2017 for CVA.  After careful review of history and examination, the risks and benefits of transesophageal echocardiogram have been explained including risks of esophageal damage, perforation (1:10,000 risk), bleeding, pharyngeal hematoma as well as other potential complications associated with conscious sedation including aspiration, arrhythmia, respiratory failure and death. Alternatives to treatment were discussed, questions were answered. Patient is willing to proceed.   Patient presents with 2 day onset of L sided weakness. Echo showed normal EF. He has a medtronic PPM for sinus nodal dysfunction, per cardmaster, device interrogation did not show new afib. Otherwise, vital sign stable, paced rhythm on telemetry. Hemoglobin and platelet ok. No obvious contraindication for TEE  David Deforest, PA-C 08/31/2017 5:11 PM

## 2017-08-31 NOTE — ED Notes (Signed)
Attempted to call report

## 2017-08-31 NOTE — Progress Notes (Signed)
OT Evaluation  PTA, pt lived at home with his wife and was modified independent with ADL and mobility @ cane level and drove. Pt presents with significant deficits as listed below. Pt with apparent sensorimotor deficits, L inattention and decreased awareness of safety/judgement and level of impairment. Wife states pt has had 1 fall in the last 3 months and feels he is off balance due to his neuropathy and toe amputations. At this time, recommend CIR for rehab to maximize functional level of independence to facilitate safe DC home with family. Will follow acutely to address established goals and facilitate DC to next venue of care.     08/31/17 0900  OT Visit Information  Last OT Received On 08/31/17  Assistance Needed +2 (for safety)  PT/OT/SLP Co-Evaluation/Treatment Yes  Reason for Co-Treatment To address functional/ADL transfers  OT goals addressed during session ADL's and self-care  History of Present Illness 77 y.o. 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 peripheral neuropathy who presents with left-sided weakness   Precautions  Precautions [place] expects to be discharged to: Private residence  Living Arrangements Spouse/significant other  Available Help at Discharge Family;Available 24 hours/day  Type of Home House  Home Access Stairs to enter  Entrance Stairs-Number of Steps 1  Home Layout One level  Bathroom Shower/Tub Tub/shower unit;Curtain  Corporate treasurer Yes  How Accessible Accessible via walker  [place] - 2 wheels;Cane - single point;Shower seat  Prior Function  Level of Independence Independent with assistive device(s)  Comments used cane and furniture walked; drove but wife states it was "scarey"  Communication  Communication Expressive difficulties  Pain Assessment  Pain  Assessment Faces  Faces Pain Scale 0  Cognition  Arousal/Alertness Lethargic;Suspect due to medications  Behavior During Therapy Flat affect  Overall Cognitive Status Impaired/Different from baseline  Area of Impairment Orientation;Attention;Following commands;Safety/judgement;Awareness;Problem solving  Orientation Level Disoriented to;Time;Situation  Current Attention Level Sustained  Following Commands Follows one step commands inconsistently  Safety/Judgement Decreased awareness of safety;Decreased awareness of deficits  Awareness Emergent  Problem Solving Slow processing;Decreased initiation;Difficulty sequencing;Requires verbal cues;Requires tactile cues  General Comments cognition most likely impacted by medicatioin but is impaired. will further assess  Upper Extremity Assessment  Upper Extremity Assessment LUE deficits/detail  LUE Deficits / Details general weakness; appears weaker distally; poor in hand manipulatioin skills; states hand feels "numb"; "alien arm " type movements during functional tasks; unaware of L hand falling off RW  LUE Sensation decreased light touch;decreased proprioception  LUE Coordination decreased fine motor;decreased gross motor  Lower Extremity Assessment  Lower Extremity Assessment Defer to PT evaluation;LLE deficits/detail  LLE Sensation history of peripheral neuropathy  Cervical / Trunk Assessment  Cervical / Trunk Assessment Other exceptions  Cervical / Trunk Exceptions posterior bias  ADL  Overall ADL's  Needs assistance/impaired  Eating/Feeding Set up;Supervision/ safety;Sitting  Grooming Minimal assistance;Sitting  Upper Body Bathing Minimal assistance;Sitting  Lower Body Bathing Moderate assistance;Sit to/from stand  Upper Body Dressing  Moderate assistance;Sitting  Lower Body Dressing Maximal assistance;Sit to/from Media planner- Water quality scientist and Hygiene Moderate assistance  Toileting -  Clothing Manipulation Details (indicate cue type and reason) began to urinate prior to placement of urinal  Functional mobility during ADLs Moderate assistance;+2 for physical assistance;Rolling walker;Cueing for safety;Cueing for sequencing (limited)  Vision- History  Baseline Vision/History Wears glasses  Vision-  Assessment  Additional Comments will further assess  Perception  Perception Tested? Yes  Perception Deficits Inattention/neglect;Spatial orientation  Spatial deficits L inattention noted during functional tasks; will further assess  Comments note difficulty orienting self in space; poor use of RW  Praxis  Praxis tested? Deficits  Deficits Limb apraxia  Praxis-Other Comments will further assess  Bed Mobility  Overal bed mobility Needs Assistance  Bed Mobility Supine to Sit;Sit to Supine  Supine to sit Min assist  Sit to supine Min guard  General bed mobility comments initially posterior lean  Transfers  Overall transfer level Needs assistance  Equipment used Rolling walker (2 wheeled)  Transfers Sit to/from Stand  Sit to Stand Mod assist;+2 physical assistance  General transfer comment poor use of RW; no knee buskling noted; poor proprioception of LLE; hx of peripheral neuropathy  Balance  Overall balance assessment Needs assistance  Sitting balance-Leahy Scale Fair  Postural control Posterior lean  Standing balance-Leahy Scale Poor  OT - End of Session  Equipment Utilized During Treatment Gait belt;Rolling walker  Activity Tolerance Patient limited by lethargy  Patient left in bed;with call bell/phone within reach;with family/visitor present (being taken to MRI)  Nurse Communication Mobility status  OT Assessment  OT Recommendation/Assessment Patient needs continued OT Services  OT Visit Diagnosis Other abnormalities of gait and mobility (R26.89);History of falling (Z91.81);Muscle weakness (generalized) (M62.81);Other symptoms and signs involving cognitive  function;Apraxia (R48.2)  OT Problem List Decreased strength;Decreased activity tolerance;Impaired balance (sitting and/or standing);Impaired vision/perception;Decreased coordination;Decreased cognition;Decreased safety awareness;Decreased knowledge of use of DME or AE;Impaired sensation;Obesity;Impaired UE functional use  OT Plan  OT Frequency (ACUTE ONLY) Min 2X/week  OT Treatment/Interventions (ACUTE ONLY) Self-care/ADL training;Therapeutic exercise;Neuromuscular education;DME and/or AE instruction;Therapeutic activities;Cognitive remediation/compensation;Visual/perceptual remediation/compensation;Patient/family education;Balance training  AM-PAC OT "6 Clicks" Daily Activity Outcome Measure  Help from another person eating meals? 3  Help from another person taking care of personal grooming? 3  Help from another person toileting, which includes using toliet, bedpan, or urinal? 2  Help from another person bathing (including washing, rinsing, drying)? 2  Help from another person to put on and taking off regular upper body clothing? 3  Help from another person to put on and taking off regular lower body clothing? 2  6 Click Score 15  ADL G Code Conversion CK  OT Recommendation  Recommendations for Other Services Rehab consult  Follow Up Recommendations CIR;Supervision/Assistance - 24 hour  OT Equipment Tub/shower bench;3 in 1 bedside commode  Individuals Consulted  Consulted and Agree with Results and Recommendations Patient;Family member/caregiver  Family Member Consulted wife/daughter  Acute Rehab OT Goals  Patient Stated Goal per family to get better  OT Goal Formulation With patient/family  Time For Goal Achievement 09/14/17  Potential to Achieve Goals Good  OT Time Calculation  OT Start Time (ACUTE ONLY) 0840  OT Stop Time (ACUTE ONLY) 0903  OT Time Calculation (min) 23 min  OT General Charges  $OT Visit 1 Visit  OT Evaluation  $OT Eval Moderate Complexity 1 Mod  Written  Expression  Dominant Hand Right  [facility], OT/L  [phone] 08/31/2017

## 2017-09-01 ENCOUNTER — Inpatient Hospital Stay (HOSPITAL_COMMUNITY): Payer: Medicare Other

## 2017-09-01 ENCOUNTER — Inpatient Hospital Stay (HOSPITAL_COMMUNITY): Payer: Medicare Other | Admitting: Anesthesiology

## 2017-09-01 ENCOUNTER — Encounter (HOSPITAL_COMMUNITY)
Admission: EM | Disposition: A | Discharge status: Rehabilitation Facility | Payer: Self-pay | Source: Home / Self Care | Attending: Internal Medicine

## 2017-09-01 ENCOUNTER — Encounter (HOSPITAL_COMMUNITY): Payer: Self-pay | Admitting: *Deleted

## 2017-09-01 DIAGNOSIS — Z950 Presence of cardiac pacemaker: Secondary | ICD-10-CM

## 2017-09-01 DIAGNOSIS — I5189 Other ill-defined heart diseases: Secondary | ICD-10-CM

## 2017-09-01 DIAGNOSIS — I2510 Atherosclerotic heart disease of native coronary artery without angina pectoris: Secondary | ICD-10-CM

## 2017-09-01 DIAGNOSIS — N183 Chronic kidney disease, stage 3 unspecified: Secondary | ICD-10-CM

## 2017-09-01 DIAGNOSIS — D62 Acute posthemorrhagic anemia: Secondary | ICD-10-CM

## 2017-09-01 DIAGNOSIS — I6389 Other cerebral infarction: Secondary | ICD-10-CM

## 2017-09-01 DIAGNOSIS — R0682 Tachypnea, not elsewhere classified: Secondary | ICD-10-CM

## 2017-09-01 DIAGNOSIS — R451 Restlessness and agitation: Secondary | ICD-10-CM

## 2017-09-01 DIAGNOSIS — I739 Peripheral vascular disease, unspecified: Secondary | ICD-10-CM

## 2017-09-01 DIAGNOSIS — I4891 Unspecified atrial fibrillation: Secondary | ICD-10-CM

## 2017-09-01 HISTORY — PX: TEE WITHOUT CARDIOVERSION: SHX5443

## 2017-09-01 LAB — GLUCOSE, CAPILLARY
Glucose-Capillary: 222 mg/dL — ABNORMAL HIGH (ref 65–99)
Glucose-Capillary: 215 mg/dL — ABNORMAL HIGH (ref 65–99)
Glucose-Capillary: 194 mg/dL — ABNORMAL HIGH (ref 65–99)
Glucose-Capillary: 168 mg/dL — ABNORMAL HIGH (ref 65–99)

## 2017-09-01 SURGERY — ECHOCARDIOGRAM, TRANSESOPHAGEAL
Anesthesia: Monitor Anesthesia Care

## 2017-09-01 MED ORDER — LIDOCAINE HCL (CARDIAC) PF 100 MG/5ML IV SOSY
PREFILLED_SYRINGE | INTRAVENOUS | Status: DC | PRN
  Administered 2017-09-01: 40 mg via INTRATRACHEAL

## 2017-09-01 MED ORDER — SODIUM CHLORIDE 0.9 % IV SOLN
INTRAVENOUS | Status: DC
  Administered 2017-09-01: 13:00:00 via INTRAVENOUS

## 2017-09-01 MED ORDER — LORAZEPAM 2 MG/ML IJ SOLN
1.0000 mg | Freq: Once | INTRAMUSCULAR | Status: AC
  Administered 2017-09-01: 1 mg via INTRAVENOUS
  Filled 2017-09-01: qty 1

## 2017-09-01 MED ORDER — PROPOFOL 500 MG/50ML IV EMUL
INTRAVENOUS | Status: DC | PRN
  Administered 2017-09-01: 75 ug/kg/min via INTRAVENOUS

## 2017-09-01 MED ORDER — HALOPERIDOL LACTATE 5 MG/ML IJ SOLN
2.0000 mg | Freq: Once | INTRAMUSCULAR | Status: AC
  Administered 2017-09-01: 2 mg via INTRAVENOUS
  Filled 2017-09-01: qty 1

## 2017-09-01 MED ORDER — PROPOFOL 10 MG/ML IV BOLUS
INTRAVENOUS | Status: DC | PRN
  Administered 2017-09-01 (×2): 20 mg via INTRAVENOUS

## 2017-09-01 NOTE — Progress Notes (Signed)
Rehab admissions - I met with patient's wife and daughter at the bedside.  Patient was asleep.  Patient has a Actuary at the bedside.  Wife can give only limited physical assistance at home.  I will follow progress and check back on Monday.  Not ready for inpatient rehab today.  Call me for questions.  #[phone]

## 2017-09-01 NOTE — Transfer of Care (Signed)
Immediate Anesthesia Transfer of Care Note  Patient: David [name]  Procedure(s) Performed: TRANSESOPHAGEAL ECHOCARDIOGRAM (TEE) (N/A )  Patient Location: Endoscopy Unit  Anesthesia Type:MAC  Level of Consciousness: awake, alert  and oriented  Airway & Oxygen Therapy: Patient Spontanous Breathing and Patient connected to nasal cannula oxygen  Post-op Assessment: Report given to RN, Post -op Vital signs reviewed and stable and Patient moving all extremities X 4  Post vital signs: Reviewed and stable  Last Vitals:  Vitals Value Taken Time  BP    Temp    Pulse    Resp    SpO2      Last Pain:  Vitals:   09/01/17 1227  TempSrc: Oral  PainSc: 0-No pain         Complications: No apparent anesthesia complications

## 2017-09-01 NOTE — Progress Notes (Signed)
0410 Patient remains confused and agitated not following commands but moving extremities times four.Patient frequently setting bed alarm off and trying to get out of bed and taking telemetry leads and pulse ox monitor off.Attempts to reorient patient unsuccessful.Dr.Kim notified order received ativan 1 mg IV times one and safety sitter at bedside.

## 2017-09-01 NOTE — Progress Notes (Signed)
Placed patient on home CPAP for the night  

## 2017-09-01 NOTE — H&P (View-Only) (Signed)
Physical Medicine and Rehabilitation Consult Reason for Consult: Decreased functional mobility related to left side weakness Referring Physician: [facility]   HPI: David [name] is a 77 y.o. right-handed male with history of CAD/pacemaker maintained on aspirin, diastolic congestive heart failure, CKD stage III, chronic right foot ulcer, diabetes mellitus with insulin pump, OSA on CPAP, PVD with right second toe amputation.  Per chart review patient lives with spouse.  Presented 08/31/2017 with left-sided weakness times 2 days.  Cranial CT reviewed, unremarkable for acute intracranial process.  Patient did not receive TPA.  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  Echocardiogram with ejection fraction of 24% grade 1 diastolic dysfunction.  Neurology follow-up with workup ongoing.  Patient with ongoing bouts of agitation and restlessness, with patient swinging at staff nursing, and a sitter was provided for patient safety.  Presently on aspirin for CVA prophylaxis.  Subcutaneous Lovenox for DVT prophylaxis.  Physical and occupational therapy evaluations are pending.  MD has requested physical medicine rehab consult.   Review of Systems  Unable to perform ROS: Acuity of condition   Past Medical History:  Diagnosis Date  . Anemia, iron deficiency   . Anxiety   . Arthritis   . BPH (benign prostatic hypertrophy)   . CAD (coronary artery disease)    Nonobstructive CAD per cath  . Cardiac pacemaker in situ   . CHF (congestive heart failure) ([place])   . Chronic ulcer of right foot ([place])   . CKD (chronic kidney disease), stage III ([place]) secondary to DM and HTN   nephrologist-  [name]  . Dyspnea   . History of cellulitis    right great toe 10-25-2014  . History of skin cancer   . HOCM (hypertrophic obstructive cardiomyopathy) ([place])   . Hypertension   . Insulin dependent type 2 diabetes mellitus ([place]) 1991   followd by [name]--  has insulin pump  . Insulin pump in place   .  OSA on CPAP   . Peripheral neuropathy    severe  . Peripheral vascular disease ([place])    bilateral lower extremities  . Rib fracture 07/24/2015  . Secondary hyperparathyroidism of renal origin ([place])   . Sinus node dysfunction ([facility])   . Sleep apnea    Past Surgical History:  Procedure Laterality Date  . AMPUTATION OF REPLICATED TOES  Mar 5809   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)  . AMPUTATION TOE Right 03/12/2015   Procedure: RIGHT HALLUS AMPUTATION ;  Surgeon: [name], MD;  Location: [facility];  Service: Podiatry;  Laterality: Right;  . CARDIAC CATHETERIZATION  11-25-2010   [place], [place]   Nonobstructive CAD  . CARDIAC PACEMAKER PLACEMENT  Nov 2009   [employer]  . CHOLECYSTECTOMY N/A 03/05/2017   Procedure: LAPAROSCOPIC CHOLECYSTECTOMY WITH INTRAOPERATIVE CHOLANGIOGRAM;  Surgeon: [name], MD;  Location: [facility];  Service: General;  Laterality: N/A;  . EP IMPLANTABLE DEVICE N/A 06/03/2015   Procedure: PPM Generator Changeout;  Surgeon: [name], MD;  Location: [facility];  Service: Cardiovascular;  Laterality: N/A;  . EXCISION BONE CYST Right 03/06/2015   Procedure: BONE BIOPSIES OF RIGHT FOOT;  Surgeon: [name], MD;  Location: [facility];  Service: Podiatry;  Laterality: Right;  . ORIF ANKLE FRACTURE Left 11/06/2014   Procedure: OPEN REDUCTION INTERNAL FIXATION (ORIF) LEFT  ANKLE FRACTURE;  Surgeon: [name], MD;  Location: [facility];  Service: Orthopedics;  Laterality: Left;  . TOTAL KNEE  ARTHROPLASTY    . VEIN LIGATION AND STRIPPING     Family History  Problem Relation Age of Onset  . Cancer Mother        breast  . Heart attack Father    Social History:  reports that he quit smoking about 33 years ago. He has a 60.00 pack-year smoking history. He has quit using smokeless tobacco. He reports that he drinks about 0.6 oz of alcohol per week. He reports that he does not use drugs. Allergies:  Allergies    Allergen Reactions  . Adhesive [Tape]     blisters   Medications Prior to Admission  Medication Sig Dispense Refill  . amLODipine (NORVASC) 10 MG tablet Take 1 tablet (10 mg total) by mouth daily. 90 tablet 3  . aspirin EC 81 MG tablet Take 81 mg by mouth daily.    . calcitRIOL (ROCALTROL) 0.25 MCG capsule Take 0.25 mcg by mouth daily.     . carvedilol (COREG) 25 MG tablet Take 1 tablet (25 mg total) by mouth 2 (two) times daily with a meal. 180 tablet 1  . cholecalciferol (VITAMIN D) 1000 units tablet Take 1 tablet (1,000 Units total) by mouth daily. (Patient taking differently: Take 1,000 Units by mouth every evening. ) 30 tablet 1  . doxazosin (CARDURA) 4 MG tablet TAKE 1 TABLET BY MOUTH ONCE DAILY 90 tablet 0  . hydrOXYzine (ATARAX/VISTARIL) 50 MG tablet Take 1 tablet (50 mg total) 3 (three) times daily as needed by mouth. 30 tablet 1  . Icosapent Ethyl (VASCEPA) 1 g CAPS 2 caps bid 120 capsule 2  . insulin regular human CONCENTRATED (HUMULIN R) 500 UNIT/ML injection USE UP TO 0.5 ML IN INSULIN PUMP daily. 1 vial 1  . isosorbide mononitrate (IMDUR) 30 MG 24 hr tablet TAKE TWO TABLETS BY MOUTH ONCE DAILY 60 tablet 11  . liraglutide (VICTOZA) 18 MG/3ML SOPN INJECT 0.3MLS (1.8 MG TOTAL) INTO THE SKIN DAILY 9 pen 1  . lisinopril (PRINIVIL,ZESTRIL) 5 MG tablet Take 5 mg by mouth every evening.     . Omega-3 Fatty Acids (FISH OIL) 1000 MG CAPS Take by mouth 2 (two) times daily.    . pantoprazole (PROTONIX) 40 MG tablet TAKE ONE TABLET BY MOUTH ONCE DAILY 90 tablet 1  . sertraline (ZOLOFT) 100 MG tablet Take 100 mg by mouth daily.    Marland Kitchen torsemide (DEMADEX) 20 MG tablet Take 20 mg by mouth 2 (two) times daily.     . traMADol (ULTRAM) 50 MG tablet Take 1-2 tablets (50-100 mg total) by mouth every 6 (six) hours as needed for moderate pain or severe pain. 30 tablet 0  . vitamin B-12 (CYANOCOBALAMIN) 100 MCG tablet Take 100 mcg by mouth daily.    . insulin regular human CONCENTRATED (HUMULIN R) 500  UNIT/ML injection USE 0.25 ML DAILY OR AS DIRECTED IN INSULIN PUMP E11.65 (Patient not taking: Reported on 08/30/2017) 20 mL 4    Home: Home Living Family/patient expects to be discharged to:: Private residence Living Arrangements: Spouse/significant other Available Help at Discharge: Family, Available 24 hours/day Type of Home: House Home Access: Stairs to enter [employer] of Steps: 1 Entrance Stairs-Rails: Right Home Layout: One level Bathroom Shower/Tub: Tub/shower unit, Architectural technologist: Standard Bathroom Accessibility: Yes Home Equipment: Environmental consultant - 2 wheels, Cane - single point, Careers adviser History: Prior Function Level of Independence: Independent with assistive device(s) Comments: used cane and furniture walked; drove but wife states it was "scarey" Functional Status:  Mobility: Bed Mobility Overal bed mobility: Needs Assistance Bed Mobility: Supine to Sit, Sit to Supine Supine to sit: Min assist Sit to supine: Min guard General bed mobility comments: initially posterior lean Transfers Overall transfer level: Needs assistance Equipment used: Rolling walker (2 wheeled) Transfers: Sit to/from Stand Sit to Stand: Mod assist, +2 physical assistance General transfer comment: poor use of RW; no knee buskling noted; poor proprioception of LLE; hx of peripheral neuropathy; did not follow commands for safe use of RW. Performed X 2, as pt abruptly sitting on first attempt.  Ambulation/Gait General Gait Details: Pt with poor sequencing and unable to follow commands to take side steps at EOB. Pt with decreased awareness of RUE and unable to maintain placement on RW.     ADL: ADL Overall ADL's : Needs assistance/impaired Eating/Feeding: Set up, Supervision/ safety, Sitting Grooming: Minimal assistance, Sitting Upper Body Bathing: Minimal assistance, Sitting Lower Body Bathing: Moderate assistance, Sit to/from stand Upper Body Dressing : Moderate  assistance, Sitting Lower Body Dressing: Maximal assistance, Sit to/from stand Toilet Transfer: RW, Moderate assistance Toileting- Clothing Manipulation and Hygiene: Moderate assistance Toileting - Clothing Manipulation Details (indicate cue type and reason): began to urinate prior to placement of urinal Functional mobility during ADLs: Moderate assistance, +2 for physical assistance, Rolling walker, Cueing for safety, Cueing for sequencing(limited)  Cognition: Cognition Overall Cognitive Status: Impaired/Different from baseline Orientation Level: Oriented to person, Disoriented to place, Disoriented to time, Disoriented to situation Cognition Arousal/Alertness: Lethargic, Suspect due to medications Behavior During Therapy: Flat affect Overall Cognitive Status: Impaired/Different from baseline Area of Impairment: Orientation, Attention, Following commands, Safety/judgement, Awareness, Problem solving Orientation Level: Disoriented to, Time, Situation Current Attention Level: Sustained Following Commands: Follows one step commands inconsistently Safety/Judgement: Decreased awareness of safety, Decreased awareness of deficits Awareness: Emergent Problem Solving: Slow processing, Decreased initiation, Difficulty sequencing, Requires verbal cues, Requires tactile cues General Comments: cognition most likely impacted by medicatioin but is impaired. will further assess  Blood pressure (!) 121/98, pulse (!) 104, temperature 98.8 F (37.1 C), temperature source Axillary, resp. rate (!) 22, height 6\' 4"  (1.93 m), weight 130.2 kg (287 lb), SpO2 93 %. Physical Exam  Vitals reviewed. Constitutional: He appears well-developed.  Obese  HENT:  Head: Normocephalic.  Eyes: EOM are normal. Right eye exhibits no discharge. Left eye exhibits no discharge.  Neck: Normal range of motion. Neck supple. No thyromegaly present.  Cardiovascular:  Irregularly irregular  Respiratory: Effort normal and breath  sounds normal. No respiratory distress.  GI: Bowel sounds are normal. He exhibits distension.  Musculoskeletal:  No edema or tenderness in extremities + Toe amputation  Neurological: He is alert.  Patient is restless and agitated.   He is sitting up in chair with a sitter at bedside.   Exam limited due to lack of participation.   Moving all extremities spontaneously, but not responding to commands.  Skin: Skin is warm and dry.  Psychiatric: His affect is blunt. His speech is delayed and slurred. He is agitated and slowed. Cognition and memory are impaired. He expresses impulsivity.    Results for orders placed or performed during the hospital encounter of 08/30/17 (from the past 24 hour(s))  CBG monitoring, ED     Status: Abnormal   Collection Time: 08/31/17  7:42 AM  Result Value Ref Range   Glucose-Capillary 185 (H) 65 - 99 mg/dL   Comment 1 Notify RN    Comment 2 Document in Chart   CBG monitoring, ED     Status:  Abnormal   Collection Time: 08/31/17  1:02 PM  Result Value Ref Range   Glucose-Capillary 202 (H) 65 - 99 mg/dL   Comment 1 Notify RN    Comment 2 Document in Chart   CBG monitoring, ED     Status: Abnormal   Collection Time: 08/31/17  5:57 PM  Result Value Ref Range   Glucose-Capillary 181 (H) 65 - 99 mg/dL   Comment 1 Notify RN    Comment 2 Document in Chart   Glucose, capillary     Status: Abnormal   Collection Time: 08/31/17  9:01 PM  Result Value Ref Range   Glucose-Capillary 183 (H) 65 - 99 mg/dL   Dg Chest 2 View  Result Date: 08/31/2017 CLINICAL DATA:  Acute ischemic stroke EXAM: CHEST - 2 VIEW COMPARISON:  06/29/2017 FINDINGS: Stable cardiomegaly with aortic atherosclerosis. Left-sided pacemaker apparatus projects over the left shoulder with leads in the right atrium and right ventricle unchanged in appearance. No overt pulmonary edema. Hazy appearance of the left lung base noted likely due to differential soft tissues overlying the lung bases and left  basilar atelectasis. No effusion is seen on the lateral view. There is no pneumothorax. Degenerative changes are present along the dorsal spine. IMPRESSION: Cardiomegaly with aortic atherosclerosis. No active pulmonary disease. Electronically Signed   By: Ashley Royalty [name]   On: 08/31/2017 01:31   Ct Head Wo Contrast  Result Date: 08/30/2017 CLINICAL DATA:  LEFT-sided facial droop. LEFT arm numbness. EXAM: CT HEAD WITHOUT CONTRAST TECHNIQUE: Contiguous axial images were obtained from the base of the skull through the vertex without intravenous contrast. COMPARISON:  Head CT 09/26/2016 FINDINGS: Brain: No acute intracranial hemorrhage. No focal mass lesion. No CT evidence of acute infarction. No midline shift or mass effect. No hydrocephalus. Basilar cisterns are patent. There are periventricular and subcortical white matter hypodensities. Generalized cortical atrophy. Vascular: No hyperdense vessel or unexpected calcification. Skull: Normal. Negative for fracture or focal lesion. Sinuses/Orbits: Paranasal sinuses and mastoid air cells are clear. Orbits are clear. Other: None. IMPRESSION: 1. No acute intracranial findings. 2. Atrophy and white matter microvascular disease. Electronically Signed   By: Suzy Bouchard [name]   On: 08/30/2017 21:39   Mr Brain Wo Contrast  Addendum Date: 08/31/2017   ADDENDUM REPORT: 08/31/2017 13:28 ADDENDUM: After further review, there are 2 small areas of restricted diffusion in the right frontal cortex posteriorly compatible with acute infarct. Electronically Signed   By: Franchot Gallo [name]   On: 08/31/2017 13:28   Result Date: 08/31/2017 CLINICAL DATA:  Focal neuro deficit. Confusion. Left-sided weakness. EXAM: MRI HEAD WITHOUT CONTRAST TECHNIQUE: Multiplanar, multiecho pulse sequences of the brain and surrounding structures were obtained without intravenous contrast. COMPARISON:  CT head 08/30/2017 FINDINGS: Brain: Incomplete study. Diagnostic diffusion-weighted imaging,  mildly degraded axial T2 imaging. The patient was not able to complete the study. Negative for acute infarct. Mild atrophy. No focal ischemic changes. Negative for mass or edema. Vascular: Normal arterial flow voids Skull and upper cervical spine: Negative Sinuses/Orbits: Mild mucosal edema paranasal sinuses. Bilateral cataract surgery Other: None IMPRESSION: No acute abnormality The patient was  not able to complete the study Electronically Signed: By: Franchot Gallo [name] On: 08/31/2017 10:41    Assessment/Plan: Diagnosis: Right frontal lobe infarcts Labs and images independently reviewed.  Records reviewed and summated above. Stroke: Continue secondary stroke prophylaxis and Risk Factor Modification listed below:   Antiplatelet therapy:   Blood Pressure Management:  Continue current medication with prn's with  permisive HTN per primary team Statin Agent:   Diabetes management:    1. Does the need for close, 24 hr/day medical supervision in concert with the patient's rehab needs make it unreasonable for this patient to be served in a less intensive setting? Yes  2. Co-Morbidities requiring supervision/potential complications: agitation and restlessness (environmental modifications as tolerated, attempt to limit cognitively affecting meds, such as IV ativan), diastolic dysfunction (monitor for signs and symptoms of fluid overload), CAD/pacemaker (cont meds), CKD stage III  (avoid nephrotoxic meds), chronic right foot ulcer, diabetes mellitus with insulin pump (Monitor in accordance with exercise and adjust meds as necessary), OSA on CPAP, PVD with right second toe amputation, tachypnea (monitor RR and O2 Sats with increased physical exertion), Afib with RVR (monitor HR with increased mobility), ABLA (transfuse if necessary to ensure appropriate perfusion for increased activity tolerance) 3. Due to bladder management, bowel management, safety, skin/wound care, disease management, medication  administration and patient education, does the patient require 24 hr/day rehab nursing? Yes 4. Does the patient require coordinated care of a physician, rehab nurse, PT (1-2 hrs/day, 5 days/week), OT (1-2 hrs/day, 5 days/week) and SLP (1-2 hrs/day, 5 days/week) to address physical and functional deficits in the context of the above medical diagnosis(es)? Yes Addressing deficits in the following areas: balance, endurance, locomotion, strength, transferring, bathing, dressing, toileting, cognition, speech and psychosocial support 5. Can the patient actively participate in an intensive therapy program of at least 3 hrs of therapy per day at least 5 days per week? Potentially 6. The potential for patient to make measurable gains while on inpatient rehab is excellent 7. Anticipated functional outcomes upon discharge from inpatient rehab are supervision  with PT, supervision with OT, supervision and min assist with SLP. 8. Estimated rehab length of stay to reach the above functional goals is: 12-16 days. 9. Anticipated D/C setting: Home 10. Anticipated post D/C treatments: HH therapy and Home excercise program 11. Overall Rehab/Functional Prognosis: good  RECOMMENDATIONS: This patient's condition is appropriate for continued rehabilitative care in the following setting: Will await completion of medical work-up.  Will consider CIR if functional deficits persist with cognitive improvement and caregiver support available upon discharge. Patient has agreed to participate in recommended program. Potentially Note that insurance prior authorization may be required for reimbursement for recommended care.  Comment: Rehab Admissions Coordinator to follow up.  I have personally performed a face to face diagnostic evaluation, including, but not limited to relevant history and physical exam findings, of this patient and developed relevant assessment and plan.  Additionally, I have reviewed and concur with the  physician assistant's documentation above.   Delice Lesch, MD, ABPMR Lavon Paganini Angiulli, PA-C 09/01/2017
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
## 2017-09-01 NOTE — Consult Note (Addendum)
Physical Medicine and Rehabilitation Consult Reason for Consult: Decreased functional mobility related to left side weakness Referring Physician: [facility]   HPI: David [name] is a 77 y.o. right-handed male with history of CAD/pacemaker maintained on aspirin, diastolic congestive heart failure, CKD stage III, chronic right foot ulcer, diabetes mellitus with insulin pump, OSA on CPAP, PVD with right second toe amputation.  Per chart review patient lives with spouse.  Presented 08/31/2017 with left-sided weakness times 2 days.  Cranial CT reviewed, unremarkable for acute intracranial process.  Patient did not receive TPA.  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  Echocardiogram with ejection fraction of 40% grade 1 diastolic dysfunction.  Neurology follow-up with workup ongoing.  Patient with ongoing bouts of agitation and restlessness, with patient swinging at staff nursing, and a sitter was provided for patient safety.  Presently on aspirin for CVA prophylaxis.  Subcutaneous Lovenox for DVT prophylaxis.  Physical and occupational therapy evaluations are pending.  MD has requested physical medicine rehab consult.   Review of Systems  Unable to perform ROS: Acuity of condition   Past Medical History:  Diagnosis Date  . Anemia, iron deficiency   . Anxiety   . Arthritis   . BPH (benign prostatic hypertrophy)   . CAD (coronary artery disease)    Nonobstructive CAD per cath  . Cardiac pacemaker in situ   . CHF (congestive heart failure) ([place])   . Chronic ulcer of right foot ([place])   . CKD (chronic kidney disease), stage III ([place]) secondary to DM and HTN   nephrologist-  [name]  . Dyspnea   . History of cellulitis    right great toe 10-25-2014  . History of skin cancer   . HOCM (hypertrophic obstructive cardiomyopathy) ([place])   . Hypertension   . 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 Insulin pump in place   .  OSA on CPAP   . Peripheral neuropathy    severe  . Peripheral vascular disease ([place])    bilateral lower extremities  . Rib fracture 07/24/2015  . Secondary hyperparathyroidism of renal origin ([place])   . Sinus node dysfunction ([facility])   . Sleep apnea    Past Surgical History:  Procedure Laterality Date  . AMPUTATION OF REPLICATED TOES  Mar 9811   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)  . AMPUTATION TOE Right 03/12/2015   Procedure: RIGHT HALLUS AMPUTATION ;  Surgeon: [name], MD;  Location: [facility];  Service: Podiatry;  Laterality: Right;  . CARDIAC CATHETERIZATION  11-25-2010   [place], [place]   Nonobstructive CAD  . CARDIAC PACEMAKER PLACEMENT  Nov 2009   [employer]  . CHOLECYSTECTOMY N/A 03/05/2017   Procedure: LAPAROSCOPIC CHOLECYSTECTOMY WITH INTRAOPERATIVE CHOLANGIOGRAM;  Surgeon: [name], MD;  Location: [facility];  Service: General;  Laterality: N/A;  . EP IMPLANTABLE DEVICE N/A 06/03/2015   Procedure: PPM Generator Changeout;  Surgeon: [name], MD;  Location: [facility];  Service: Cardiovascular;  Laterality: N/A;  . EXCISION BONE CYST Right 03/06/2015   Procedure: BONE BIOPSIES OF RIGHT FOOT;  Surgeon: [name], MD;  Location: [facility];  Service: Podiatry;  Laterality: Right;  . ORIF ANKLE FRACTURE Left 11/06/2014   Procedure: OPEN REDUCTION INTERNAL FIXATION (ORIF) LEFT  ANKLE FRACTURE;  Surgeon: [name], MD;  Location: [facility];  Service: Orthopedics;  Laterality: Left;  . TOTAL KNEE  ARTHROPLASTY    . VEIN LIGATION AND STRIPPING     Family History  Problem Relation Age of Onset  . Cancer Mother        breast  . Heart attack Father    Social History:  reports that he quit smoking about 33 years ago. He has a 60.00 pack-year smoking history. He has quit using smokeless tobacco. He reports that he drinks about 0.6 oz of alcohol per week. He reports that he does not use drugs. Allergies:  Allergies    Allergen Reactions  . Adhesive [Tape]     blisters   Medications Prior to Admission  Medication Sig Dispense Refill  . amLODipine (NORVASC) 10 MG tablet Take 1 tablet (10 mg total) by mouth daily. 90 tablet 3  . aspirin EC 81 MG tablet Take 81 mg by mouth daily.    . calcitRIOL (ROCALTROL) 0.25 MCG capsule Take 0.25 mcg by mouth daily.     . carvedilol (COREG) 25 MG tablet Take 1 tablet (25 mg total) by mouth 2 (two) times daily with a meal. 180 tablet 1  . cholecalciferol (VITAMIN D) 1000 units tablet Take 1 tablet (1,000 Units total) by mouth daily. (Patient taking differently: Take 1,000 Units by mouth every evening. ) 30 tablet 1  . doxazosin (CARDURA) 4 MG tablet TAKE 1 TABLET BY MOUTH ONCE DAILY 90 tablet 0  . hydrOXYzine (ATARAX/VISTARIL) 50 MG tablet Take 1 tablet (50 mg total) 3 (three) times daily as needed by mouth. 30 tablet 1  . Icosapent Ethyl (VASCEPA) 1 g CAPS 2 caps bid 120 capsule 2  . insulin regular human CONCENTRATED (HUMULIN R) 500 UNIT/ML injection USE UP TO 0.5 ML IN INSULIN PUMP daily. 1 vial 1  . isosorbide mononitrate (IMDUR) 30 MG 24 hr tablet TAKE TWO TABLETS BY MOUTH ONCE DAILY 60 tablet 11  . liraglutide (VICTOZA) 18 MG/3ML SOPN INJECT 0.3MLS (1.8 MG TOTAL) INTO THE SKIN DAILY 9 pen 1  . lisinopril (PRINIVIL,ZESTRIL) 5 MG tablet Take 5 mg by mouth every evening.     . Omega-3 Fatty Acids (FISH OIL) 1000 MG CAPS Take by mouth 2 (two) times daily.    . pantoprazole (PROTONIX) 40 MG tablet TAKE ONE TABLET BY MOUTH ONCE DAILY 90 tablet 1  . sertraline (ZOLOFT) 100 MG tablet Take 100 mg by mouth daily.    Marland Kitchen torsemide (DEMADEX) 20 MG tablet Take 20 mg by mouth 2 (two) times daily.     . traMADol (ULTRAM) 50 MG tablet Take 1-2 tablets (50-100 mg total) by mouth every 6 (six) hours as needed for moderate pain or severe pain. 30 tablet 0  . vitamin B-12 (CYANOCOBALAMIN) 100 MCG tablet Take 100 mcg by mouth daily.    . insulin regular human CONCENTRATED (HUMULIN R) 500  UNIT/ML injection USE 0.25 ML DAILY OR AS DIRECTED IN INSULIN PUMP E11.65 (Patient not taking: Reported on 08/30/2017) 20 mL 4    Home: Home Living Family/patient expects to be discharged to:: Private residence Living Arrangements: Spouse/significant other Available Help at Discharge: Family, Available 24 hours/day Type of Home: House Home Access: Stairs to enter [employer] of Steps: 1 Entrance Stairs-Rails: Right Home Layout: One level Bathroom Shower/Tub: Tub/shower unit, Architectural technologist: Standard Bathroom Accessibility: Yes Home Equipment: Environmental consultant - 2 wheels, Cane - single point, Careers adviser History: Prior Function Level of Independence: Independent with assistive device(s) Comments: used cane and furniture walked; drove but wife states it was "scarey" Functional Status:  Mobility: Bed Mobility Overal bed mobility: Needs Assistance Bed Mobility: Supine to Sit, Sit to Supine Supine to sit: Min assist Sit to supine: Min guard General bed mobility comments: initially posterior lean Transfers Overall transfer level: Needs assistance Equipment used: Rolling walker (2 wheeled) Transfers: Sit to/from Stand Sit to Stand: Mod assist, +2 physical assistance General transfer comment: poor use of RW; no knee buskling noted; poor proprioception of LLE; hx of peripheral neuropathy; did not follow commands for safe use of RW. Performed X 2, as pt abruptly sitting on first attempt.  Ambulation/Gait General Gait Details: Pt with poor sequencing and unable to follow commands to take side steps at EOB. Pt with decreased awareness of RUE and unable to maintain placement on RW.     ADL: ADL Overall ADL's : Needs assistance/impaired Eating/Feeding: Set up, Supervision/ safety, Sitting Grooming: Minimal assistance, Sitting Upper Body Bathing: Minimal assistance, Sitting Lower Body Bathing: Moderate assistance, Sit to/from stand Upper Body Dressing : Moderate  assistance, Sitting Lower Body Dressing: Maximal assistance, Sit to/from stand Toilet Transfer: RW, Moderate assistance Toileting- Clothing Manipulation and Hygiene: Moderate assistance Toileting - Clothing Manipulation Details (indicate cue type and reason): began to urinate prior to placement of urinal Functional mobility during ADLs: Moderate assistance, +2 for physical assistance, Rolling walker, Cueing for safety, Cueing for sequencing(limited)  Cognition: Cognition Overall Cognitive Status: Impaired/Different from baseline Orientation Level: Oriented to person, Disoriented to place, Disoriented to time, Disoriented to situation Cognition Arousal/Alertness: Lethargic, Suspect due to medications Behavior During Therapy: Flat affect Overall Cognitive Status: Impaired/Different from baseline Area of Impairment: Orientation, Attention, Following commands, Safety/judgement, Awareness, Problem solving Orientation Level: Disoriented to, Time, Situation Current Attention Level: Sustained Following Commands: Follows one step commands inconsistently Safety/Judgement: Decreased awareness of safety, Decreased awareness of deficits Awareness: Emergent Problem Solving: Slow processing, Decreased initiation, Difficulty sequencing, Requires verbal cues, Requires tactile cues General Comments: cognition most likely impacted by medicatioin but is impaired. will further assess  Blood pressure (!) 121/98, pulse (!) 104, temperature 98.8 F (37.1 C), temperature source Axillary, resp. rate (!) 22, height 6\' 4"  (1.93 m), weight 130.2 kg (287 lb), SpO2 93 %. Physical Exam  Vitals reviewed. Constitutional: He appears well-developed.  Obese  HENT:  Head: Normocephalic.  Eyes: EOM are normal. Right eye exhibits no discharge. Left eye exhibits no discharge.  Neck: Normal range of motion. Neck supple. No thyromegaly present.  Cardiovascular:  Irregularly irregular  Respiratory: Effort normal and breath  sounds normal. No respiratory distress.  GI: Bowel sounds are normal. He exhibits distension.  Musculoskeletal:  No edema or tenderness in extremities + Toe amputation  Neurological: He is alert.  Patient is restless and agitated.   He is sitting up in chair with a sitter at bedside.   Exam limited due to lack of participation.   Moving all extremities spontaneously, but not responding to commands.  Skin: Skin is warm and dry.  Psychiatric: His affect is blunt. His speech is delayed and slurred. He is agitated and slowed. Cognition and memory are impaired. He expresses impulsivity.    Results for orders placed or performed during the hospital encounter of 08/30/17 (from the past 24 hour(s))  CBG monitoring, ED     Status: Abnormal   Collection Time: 08/31/17  7:42 AM  Result Value Ref Range   Glucose-Capillary 185 (H) 65 - 99 mg/dL   Comment 1 Notify RN    Comment 2 Document in Chart   CBG monitoring, ED     Status:  Abnormal   Collection Time: 08/31/17  1:02 PM  Result Value Ref Range   Glucose-Capillary 202 (H) 65 - 99 mg/dL   Comment 1 Notify RN    Comment 2 Document in Chart   CBG monitoring, ED     Status: Abnormal   Collection Time: 08/31/17  5:57 PM  Result Value Ref Range   Glucose-Capillary 181 (H) 65 - 99 mg/dL   Comment 1 Notify RN    Comment 2 Document in Chart   Glucose, capillary     Status: Abnormal   Collection Time: 08/31/17  9:01 PM  Result Value Ref Range   Glucose-Capillary 183 (H) 65 - 99 mg/dL   Dg Chest 2 View  Result Date: 08/31/2017 CLINICAL DATA:  Acute ischemic stroke EXAM: CHEST - 2 VIEW COMPARISON:  06/29/2017 FINDINGS: Stable cardiomegaly with aortic atherosclerosis. Left-sided pacemaker apparatus projects over the left shoulder with leads in the right atrium and right ventricle unchanged in appearance. No overt pulmonary edema. Hazy appearance of the left lung base noted likely due to differential soft tissues overlying the lung bases and left  basilar atelectasis. No effusion is seen on the lateral view. There is no pneumothorax. Degenerative changes are present along the dorsal spine. IMPRESSION: Cardiomegaly with aortic atherosclerosis. No active pulmonary disease. Electronically Signed   By: Ashley Royalty [name]   On: 08/31/2017 01:31   Ct Head Wo Contrast  Result Date: 08/30/2017 CLINICAL DATA:  LEFT-sided facial droop. LEFT arm numbness. EXAM: CT HEAD WITHOUT CONTRAST TECHNIQUE: Contiguous axial images were obtained from the base of the skull through the vertex without intravenous contrast. COMPARISON:  Head CT 09/26/2016 FINDINGS: Brain: No acute intracranial hemorrhage. No focal mass lesion. No CT evidence of acute infarction. No midline shift or mass effect. No hydrocephalus. Basilar cisterns are patent. There are periventricular and subcortical white matter hypodensities. Generalized cortical atrophy. Vascular: No hyperdense vessel or unexpected calcification. Skull: Normal. Negative for fracture or focal lesion. Sinuses/Orbits: Paranasal sinuses and mastoid air cells are clear. Orbits are clear. Other: None. IMPRESSION: 1. No acute intracranial findings. 2. Atrophy and white matter microvascular disease. Electronically Signed   By: Suzy Bouchard [name]   On: 08/30/2017 21:39   Mr Brain Wo Contrast  Addendum Date: 08/31/2017   ADDENDUM REPORT: 08/31/2017 13:28 ADDENDUM: After further review, there are 2 small areas of restricted diffusion in the right frontal cortex posteriorly compatible with acute infarct. Electronically Signed   By: Franchot Gallo [name]   On: 08/31/2017 13:28   Result Date: 08/31/2017 CLINICAL DATA:  Focal neuro deficit. Confusion. Left-sided weakness. EXAM: MRI HEAD WITHOUT CONTRAST TECHNIQUE: Multiplanar, multiecho pulse sequences of the brain and surrounding structures were obtained without intravenous contrast. COMPARISON:  CT head 08/30/2017 FINDINGS: Brain: Incomplete study. Diagnostic diffusion-weighted imaging,  mildly degraded axial T2 imaging. The patient was not able to complete the study. Negative for acute infarct. Mild atrophy. No focal ischemic changes. Negative for mass or edema. Vascular: Normal arterial flow voids Skull and upper cervical spine: Negative Sinuses/Orbits: Mild mucosal edema paranasal sinuses. Bilateral cataract surgery Other: None IMPRESSION: No acute abnormality The patient was  not able to complete the study Electronically Signed: By: Franchot Gallo [name] On: 08/31/2017 10:41    Assessment/Plan: Diagnosis: Right frontal lobe infarcts Labs and images independently reviewed.  Records reviewed and summated above. Stroke: Continue secondary stroke prophylaxis and Risk Factor Modification listed below:   Antiplatelet therapy:   Blood Pressure Management:  Continue current medication with prn's with  permisive HTN per primary team Statin Agent:   Diabetes management:    1. Does the need for close, 24 hr/day medical supervision in concert with the patient's rehab needs make it unreasonable for this patient to be served in a less intensive setting? Yes  2. Co-Morbidities requiring supervision/potential complications: agitation and restlessness (environmental modifications as tolerated, attempt to limit cognitively affecting meds, such as IV ativan), diastolic dysfunction (monitor for signs and symptoms of fluid overload), CAD/pacemaker (cont meds), CKD stage III  (avoid nephrotoxic meds), chronic right foot ulcer, diabetes mellitus with insulin pump (Monitor in accordance with exercise and adjust meds as necessary), OSA on CPAP, PVD with right second toe amputation, tachypnea (monitor RR and O2 Sats with increased physical exertion), Afib with RVR (monitor HR with increased mobility), ABLA (transfuse if necessary to ensure appropriate perfusion for increased activity tolerance) 3. Due to bladder management, bowel management, safety, skin/wound care, disease management, medication  administration and patient education, does the patient require 24 hr/day rehab nursing? Yes 4. Does the patient require coordinated care of a physician, rehab nurse, PT (1-2 hrs/day, 5 days/week), OT (1-2 hrs/day, 5 days/week) and SLP (1-2 hrs/day, 5 days/week) to address physical and functional deficits in the context of the above medical diagnosis(es)? Yes Addressing deficits in the following areas: balance, endurance, locomotion, strength, transferring, bathing, dressing, toileting, cognition, speech and psychosocial support 5. Can the patient actively participate in an intensive therapy program of at least 3 hrs of therapy per day at least 5 days per week? Potentially 6. The potential for patient to make measurable gains while on inpatient rehab is excellent 7. Anticipated functional outcomes upon discharge from inpatient rehab are supervision  with PT, supervision with OT, supervision and min assist with SLP. 8. Estimated rehab length of stay to reach the above functional goals is: 12-16 days. 9. Anticipated D/C setting: Home 10. Anticipated post D/C treatments: HH therapy and Home excercise program 11. Overall Rehab/Functional Prognosis: good  RECOMMENDATIONS: This patient's condition is appropriate for continued rehabilitative care in the following setting: Will await completion of medical work-up.  Will consider CIR if functional deficits persist with cognitive improvement and caregiver support available upon discharge. Patient has agreed to participate in recommended program. Potentially Note that insurance prior authorization may be required for reimbursement for recommended care.  Comment: Rehab Admissions Coordinator to follow up.  I have personally performed a face to face diagnostic evaluation, including, but not limited to relevant history and physical exam findings, of this patient and developed relevant assessment and plan.  Additionally, I have reviewed and concur with the  physician assistant's documentation above.   Delice Lesch, MD, ABPMR Lavon Paganini Angiulli, PA-C 09/01/2017

## 2017-09-01 NOTE — Progress Notes (Signed)
STROKE TEAM PROGRESS NOTE  David [name] is a 77 y.o. male with multiple medical problems including diabetes, hypertension, significant heart disease status post pacemaker placement who presents with left-sided weakness that started 2 days ago. LKW: 4/15 tpa given?: no, out of window Premorbid modified rankin scale: 1   INTERVAL HISTORY Patient states his weakness is improving.  He had pacemaker interrogated which did not show any evidence of paroxysmal atrial fibrillation.  He is scheduled to undergo transesophageal echocardiogram later today. CBC:  CBC Latest Ref Rng & Units 08/30/2017 06/29/2017 03/07/2017  WBC 4.0 - 10.5 K/uL 8.6 8.2 6.8  Hemoglobin 13.0 - 17.0 g/dL 11.5(L) 11.9(L) 9.6(L)  Hematocrit 39.0 - 52.0 % 34.8(L) 35.7(L) 29.3(L)  Platelets 150 - 400 K/uL 190 207.0 150     Comprehensive Metabolic Panel:   CMP Latest Ref Rng & Units 08/30/2017 07/21/2017 06/29/2017  Glucose 65 - 99 mg/dL 136(H) 278(H) 315(H)  BUN 6 - 20 mg/dL 33(H) - 42(H)  Creatinine 0.61 - 1.24 mg/dL 1.95(H) - 2.13(H)  Sodium 135 - 145 mmol/L 139 - 137  Potassium 3.5 - 5.1 mmol/L 4.7 - 4.6  Chloride 101 - 111 mmol/L 108 - 103  CO2 22 - 32 mmol/L 23 - 26  Calcium 8.9 - 10.3 mg/dL 9.5 - 9.1  Total Protein 6.0 - 8.3 g/dL - - 6.9  Total Bilirubin 0.2 - 1.2 mg/dL - - 0.6  Alkaline Phos 39 - 117 U/L - - 101  AST 0 - 37 U/L - - 19  ALT 0 - 53 U/L - - 22   Lipid Panel:     Component Value Date/Time   CHOL 165 08/31/2017 0500   TRIG 195 (H) 08/31/2017 0500   HDL 20 (L) 08/31/2017 0500   CHOLHDL 8.3 08/31/2017 0500   VLDL 39 08/31/2017 0500   LDLCALC 106 (H) 08/31/2017 0500   HgbA1c:  Lab Results  Component Value Date   HGBA1C 7.6 (H) 08/31/2017   Dg Chest 2 View  Result Date: 08/31/2017 CLINICAL DATA:  Acute ischemic stroke EXAM: CHEST - 2 VIEW COMPARISON:  06/29/2017 FINDINGS: Stable cardiomegaly with aortic atherosclerosis. Left-sided pacemaker apparatus projects over the left shoulder with leads  in the right atrium and right ventricle unchanged in appearance. No overt pulmonary edema. Hazy appearance of the left lung base noted likely due to differential soft tissues overlying the lung bases and left basilar atelectasis. No effusion is seen on the lateral view. There is no pneumothorax. Degenerative changes are present along the dorsal spine. IMPRESSION: Cardiomegaly with aortic atherosclerosis. No active pulmonary disease. Electronically Signed   By: Ashley Royalty [name]   On: 08/31/2017 01:31   Ct Head Wo Contrast  Result Date: 08/30/2017 CLINICAL DATA:  LEFT-sided facial droop. LEFT arm numbness. EXAM: CT HEAD WITHOUT CONTRAST TECHNIQUE: Contiguous axial images were obtained from the base of the skull through the vertex without intravenous contrast. COMPARISON:  Head CT 09/26/2016 FINDINGS: Brain: No acute intracranial hemorrhage. No focal mass lesion. No CT evidence of acute infarction. No midline shift or mass effect. No hydrocephalus. Basilar cisterns are patent. There are periventricular and subcortical white matter hypodensities. Generalized cortical atrophy. Vascular: No hyperdense vessel or unexpected calcification. Skull: Normal. Negative for fracture or focal lesion. Sinuses/Orbits: Paranasal sinuses and mastoid air cells are clear. Orbits are clear. Other: None. IMPRESSION: 1. No acute intracranial findings. 2. Atrophy and white matter microvascular disease. Electronically Signed   By: Suzy Bouchard [name]   On: 08/30/2017 21:39  Mr Brain Wo Contrast  Addendum Date: 08/31/2017   ADDENDUM REPORT: 08/31/2017 13:28 ADDENDUM: After further review, there are 2 small areas of restricted diffusion in the right frontal cortex posteriorly compatible with acute infarct. Electronically Signed   By: Franchot Gallo [name]   On: 08/31/2017 13:28   Result Date: 08/31/2017 CLINICAL DATA:  Focal neuro deficit. Confusion. Left-sided weakness. EXAM: MRI HEAD WITHOUT CONTRAST TECHNIQUE: Multiplanar,  multiecho pulse sequences of the brain and surrounding structures were obtained without intravenous contrast. COMPARISON:  CT head 08/30/2017 FINDINGS: Brain: Incomplete study. Diagnostic diffusion-weighted imaging, mildly degraded axial T2 imaging. The patient was not able to complete the study. Negative for acute infarct. Mild atrophy. No focal ischemic changes. Negative for mass or edema. Vascular: Normal arterial flow voids Skull and upper cervical spine: Negative Sinuses/Orbits: Mild mucosal edema paranasal sinuses. Bilateral cataract surgery Other: None IMPRESSION: No acute abnormality The patient was  not able to complete the study Electronically Signed: By: Franchot Gallo [name] On: 08/31/2017 10:41     Vitals:   09/01/17 1227 09/01/17 1352 09/01/17 1400 09/01/17 1605  BP: (!) 125/37 (!) 101/48 (!) 123/40   Pulse: 60 91 61   Resp: 13 13 14    Temp: 97.7 F (36.5 C) (!) 97.5 F (36.4 C)  97.6 F (36.4 C)  TempSrc: Oral Oral  Oral  SpO2: 97% 97% 94%   Weight:      Height:       PHYSICAL EXAM Pleasant elderly Caucasian male currently not in distress. . Afebrile. Head is nontraumatic. Neck is supple without bruit.    Cardiac exam no murmur or gallop. Lungs are clear to auscultation. Distal pulses are well felt. Neurological Exam: Awake alert oriented x 3 normal speech and language. Mild left lower face asymmetry. Tongue midline. No drift. Mild diminished fine finger movements on left. Orbits right over left upper extremity. Mild left grip weak.. Normal sensation . Normal coordination.  ASSESSMENT/PLAN [name] is a 77 y.o. male with history of diabetes, hypertension, significant heart disease status post pacemaker placement  presenting with L sided weakness x 2 days.   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 Small vessel disease. Atrophy.   MRI  2 small R frontal cortical infarcts  MRA pending    Carotid Doppler  R ok, L ended early as pt combative  2D Echo  pending   LDL 106  HgbA1c 7.6  Lovenox 40 mg sq daily for VTE prophylaxis Fall precautions  aspirin 81 mg daily prior to admission, now on aspirin 325 mg daily and plavix 75 mg daily x 3 weeks then on plavix alone.   Therapy recommendations:  CIR. Consult placed  Disposition:  pending (at home w/ family PTA)  Hypertension  Stable . Permissive hypertension (OK if < 220/120) but gradually normalize in 5-7 days . Long-term BP goal normotensive  Hyperlipidemia  Home meds:  Fish oil, now on lipitor 10  LDL 106, goal < 70  Continue statin at discharge  Diabetes type II  HgbA1c 7.6, goal < 7.0  Uncontrolled  Other Stroke Risk Factors  Advanced age  101 / ETOH level not performed   Former Cigarette smoker  ETOH use  Obesity, Body mass index is 34.93 kg/m.  Coronary artery disease  Obstructive sleep apnea  CHF, HCOM s/p pacer  Other Active Problems  BPH  CKD III  Hospital day # 1  I have  personally examined this patient, reviewed notes, independently viewed imaging studies, participated in medical decision making and plan of care.ROS completed by me personally and pertinent positives fully documented  I have made any additions or clarifications directly to the above note. Agree with note above.  He presented with left-sided weakness for 2 days due to embolic right frontal MCA branch infarct etiology to be determined.    n.  Check transesophageal echocardiogram for cardiac source of embolism.  Dual antiplatelet therapy of an aspirin and Plavix for 3 weeks followed by Plavix alone unless obvious reason for anticoagulation is found.  Long discussion with the patient as well as Dr. Roderic Palau and answered questions.  Greater than 50% time during the 25-minute visit was spent on counseling and coordination of care about his embolic stroke and discussion about evaluation and treatment plan. Stroke team  will sign off.  Kindly call for questions.  Follow-up as an outpatient in the stroke clinic in 6 weeks.  Antony Contras, MD Medical Director [facility] Pager: [phone] 09/01/2017 5:09 PM   To contact Stroke Continuity provider, please refer to [email or web address] After hours, contact General Neurology

## 2017-09-01 NOTE — Interval H&P Note (Signed)
History and Physical Interval Note:  09/01/2017 11:52 AM  David [name]  has presented today for surgery, with the diagnosis of STROKE  The various methods of treatment have been discussed with the patient and family. After consideration of risks, benefits and other options for treatment, the patient has consented to  Procedure(s): TRANSESOPHAGEAL ECHOCARDIOGRAM (TEE) (N/A) as a surgical intervention .  The patient's history has been reviewed, patient examined, no change in status, stable for surgery.  I have reviewed the patient's chart and labs.  Questions were answered to the patient's satisfaction.     Dorris Carnes

## 2017-09-01 NOTE — Anesthesia Preprocedure Evaluation (Addendum)
Anesthesia Evaluation  Patient identified by MRN, date of birth, ID band Patient awake    Reviewed: Allergy & Precautions, NPO status , Patient's Chart, lab work & pertinent test results  Airway Mallampati: III  TM Distance: >3 FB Neck ROM: Full    Dental  (+) Dental Advidsory Given, Teeth Intact   Pulmonary shortness of breath and with exertion, sleep apnea and Continuous Positive Airway Pressure Ventilation , former smoker,    Pulmonary exam normal        Cardiovascular hypertension, Pt. on medications and Pt. on home beta blockers + CAD, + Peripheral Vascular Disease and +CHF  Normal cardiovascular exam+ pacemaker      Neuro/Psych CVA, Residual Symptoms    GI/Hepatic negative GI ROS, Neg liver ROS,   Endo/Other  diabetes, Type 2, Insulin DependentMorbid obesity  Renal/GU CRFRenal disease     Musculoskeletal   Abdominal   Peds  Hematology  (+) anemia ,   Anesthesia Other Findings   Reproductive/Obstetrics                            Anesthesia Physical Anesthesia Plan  ASA: III  Anesthesia Plan: MAC   Post-op Pain Management:    Induction: Intravenous  PONV Risk Score and Plan: 1 and Propofol infusion and Treatment may vary due to age or medical condition  Airway Management Planned: Natural Airway and Nasal Cannula  Additional Equipment:   Intra-op Plan:   Post-operative Plan:   Informed Consent: I have reviewed the patients History and Physical, chart, labs and discussed the procedure including the risks, benefits and alternatives for the proposed anesthesia with the patient or authorized representative who has indicated his/her understanding and acceptance.   Dental Advisory Given  Plan Discussed with: CRNA  Anesthesia Plan Comments:        Anesthesia Quick Evaluation

## 2017-09-01 NOTE — Progress Notes (Signed)
Inpatient Diabetes Program Recommendations  AACE/ADA: New Consensus Statement on Inpatient Glycemic Control (2015)  Target Ranges:  Prepandial:   less than 140 mg/dL      Peak postprandial:   less than 180 mg/dL (1-2 hours)      Critically ill patients:  140 - 180 mg/dL   Lab Results  Component Value Date   GLUCAP 215 (H) 09/01/2017   HGBA1C 7.6 (H) 08/31/2017    Review of Glycemic Control Results for David [name], David [name] (MRN [id number]) as of 09/01/2017 16:23  Ref. Range 08/31/2017 17:57 08/31/2017 21:01 09/01/2017 07:53 09/01/2017 12:56  Glucose-Capillary Latest Ref Range: 65 - 99 mg/dL 181 (H) 183 (H) 222 (H) 215 (H)    Diabetes history: DM2 Outpatient Diabetes medications: Medtronic insulin pump with Humulin R U500 (concentrated insulin), Victoza 1.8 mg daily Current orders for Inpatient glycemic control: Lantus 15 units BID, Novolog 0-15 units TID, Novolog 4 units TID with meals for meal coverage  Inpatient Diabetes Program Recommendations:  Spoke with patient's family and wife regarding home regimen using U500 via insulin pump. Currently, patient has pump disconnected and is having subcutaneous insulin administered.  At this time wife does not know how to use insulin pump for discharge. Therefore, I am recommending injections for exogenous insulin at discharge based on inpatient insulin needs. The wife plans to follow up with [name] and the pump trainer at the office when wife is able post rehab.  Education provided on U500 and the 1:5 unit ratio. We discussed safety of using this highly concentrated insulin. Patho was discussed, importance of diet (assuming patient is able), and frequency of BS checks. Wife and family expresses comfort in insulin injections as long as they know dose and time, etc. Explained this would be provided in discharge summary. We discussed that insulin needs for patient may change and be decreased because patient may not be eating the same amount of  carbohydrates.  At this time patient and family have needed supplies, meter, and can safely administer insulin. Has no further needs for diabetes at this time.   Thanks, Bronson Curb, MSN, RNC-OB Diabetes Coordinator [phone] (8a-5p)

## 2017-09-01 NOTE — Progress Notes (Signed)
PT Cancellation Note  Patient Details Name: David [name] MRN: [id number] DOB: 09/17/1940   Cancelled Treatment:    Reason Eval/Treat Not Completed: Patient at procedure or test/unavailable; patient down for TEE.  Will attempt another day   Reginia Naas 09/01/2017, 3:30 PM  Magda Kiel, [place] 09/01/2017

## 2017-09-01 NOTE — Progress Notes (Signed)
PROGRESS NOTE    David [name]  NTZ:[id number] DOB: 05-Mar-1941 DOA: 08/30/2017 PCP: [name], MD    Brief Narrative:  77 year old male with a history of diabetes on insulin pump, hypertension, hyperlipidemia, chronic kidney disease stage III, presented with 2-day history of left hand weakness along with speech impairment and left-sided facial droop.  Found to have embolic right MCA distribution infarct.  Seen by stroke service and recommendations for dual antiplatelet therapy.  Seen by physical therapy recommended CIR.  Currently being evaluated for CIR discharge.   Assessment & Plan:   Principal Problem:   Acute ischemic stroke [facility]) Active Problems:   Hypertension associated with diabetes ([place])   Hyperlipidemia associated with type 2 diabetes mellitus ([place])   CKD stage 3 due to type 2 diabetes mellitus ([place])   Diabetes mellitus with neurological manifestations, uncontrolled ([facility])   OSA on CPAP   Pacemaker   HOCM (hypertrophic obstructive cardiomyopathy) (HCC)   IDDM (insulin dependent diabetes mellitus) - on insulin pump   Agitation   Restless   Diastolic dysfunction   Coronary artery disease involving native coronary artery of native heart without angina pectoris   Cardiac pacemaker in situ   Stage 3 chronic kidney disease (HCC)   Atrial fibrillation with rapid ventricular response (HCC)   Acute blood loss anemia   PVD (peripheral vascular disease) (HCC)   Tachypnea   1. Acute ischemic stroke.  Patient found to have right embolic right frontal MCA branch infarct, etiology undetermined.  Transesophageal echocardiogram did not show any cardiac source of embolism.  Recommendations per neurology are for dual antiplatelet therapy of aspirin and Plavix for 3 weeks followed by Plavix alone.  He was seen by physical therapy and occupational therapy who recommended CIR placement.  Currently he is being evaluated by rehab team and is a potential to go to CIR.  LDL above goal at  106 and he is been started on Lipitor.  A1c is 7.6.  Cardiology notes, pacemaker was interrogated and did not show any new atrial fibrillation. 2. Hyperlipidemia.  LDL of 106.  Started on Lipitor 3. Chronic kidney disease stage III due to diabetes.  Renal function is stable.  Creatinine is near baseline. 4. Diabetes.  A1c of 7.6.  Currently on sliding scale.  On insulin pump at home. 5. Hypertension.  On multiple medications including amlodipine, lisinopril, Imdur, Coreg, torsemide.  Blood pressure currently stable. 6. Obstructive sleep apnea.  Continue on CPAP 7. Obesity.  BMI 34.9.  Counseled on the importance of diet and exercise. 8. Hypertrophic cardiomyopathy status post pacemaker.   DVT prophylaxis: Lovenox Code Status: Full code Family Communication: Discussed with daughter at the bedside Disposition Plan: Pending possible discharge to CIR   Consultants:   Neurology  Cardiology  Procedures:   Transesophageal echocardiogram.  No source of cardiac embolus or PFO  Antimicrobials:       Subjective: Patient seen in room post procedure.  Denies any chest pain or shortness of breath.  Does not have any new complaints.  Objective: Vitals:   09/01/17 1352 09/01/17 1400 09/01/17 1605 09/01/17 1808  BP: (!) 101/48 (!) 123/40  (!) 144/61  Pulse: 91 61    Resp: 13 14    Temp: (!) 97.5 F (36.4 C)  97.6 F (36.4 C)   TempSrc: Oral  Oral   SpO2: 97% 94%    Weight:      Height:        Intake/Output Summary (Last 24 hours) at 09/01/2017  [place] filed at 09/01/2017 1647 Gross per 24 hour  Intake 160.51 ml  Output 1225 ml  Net -1064.49 ml   Filed Weights   08/30/17 2108  Weight: 130.2 kg (287 lb)    Examination:  General exam: Appears calm and comfortable  Respiratory system: Clear to auscultation. Respiratory effort normal. Cardiovascular system: S1 & S2 heard, RRR. No JVD, murmurs, rubs, gallops or clicks. No pedal edema. Gastrointestinal system: Abdomen  is nondistended, soft and nontender. No organomegaly or masses felt. Normal bowel sounds heard. Central nervous system: Alert and oriented.  Normal strength in extremities bilaterally. Extremities: Symmetric 5 x 5 power. Skin: No rashes, lesions or ulcers Psychiatry: Judgement and insight appear normal. Mood & affect appropriate.     Data Reviewed: I have personally reviewed following labs and imaging studies  CBC: Recent Labs  Lab 08/30/17 2057  WBC 8.6  NEUTROABS 6.0  HGB 11.5*  HCT 34.8*  MCV 91.1  PLT 983   Basic Metabolic Panel: Recent Labs  Lab 08/30/17 2057  NA 139  K 4.7  CL 108  CO2 23  GLUCOSE 136*  BUN 33*  CREATININE 1.95*  CALCIUM 9.5   GFR: Estimated Creatinine Clearance: 46.8 mL/min (A) (by C-G formula based on SCr of 1.95 mg/dL (H)). Liver Function Tests: No results for input(s): AST, ALT, ALKPHOS, BILITOT, PROT, ALBUMIN in the last 168 hours. No results for input(s): LIPASE, AMYLASE in the last 168 hours. No results for input(s): AMMONIA in the last 168 hours. Coagulation Profile: No results for input(s): INR, PROTIME in the last 168 hours. Cardiac Enzymes: No results for input(s): CKTOTAL, CKMB, CKMBINDEX, TROPONINI in the last 168 hours. BNP (last 3 results) No results for input(s): PROBNP in the last 8760 hours. HbA1C: Recent Labs    08/31/17 0500  HGBA1C 7.6*   CBG: Recent Labs  Lab 08/31/17 1757 08/31/17 2101 09/01/17 0753 09/01/17 1256 09/01/17 1750  GLUCAP 181* 183* 222* 215* 194*   Lipid Profile: Recent Labs    08/31/17 0500  CHOL 165  HDL 20*  LDLCALC 106*  TRIG 195*  CHOLHDL 8.3   Thyroid Function Tests: No results for input(s): TSH, T4TOTAL, FREET4, T3FREE, THYROIDAB in the last 72 hours. Anemia Panel: No results for input(s): VITAMINB12, FOLATE, FERRITIN, TIBC, IRON, RETICCTPCT in the last 72 hours. Sepsis Labs: No results for input(s): PROCALCITON, LATICACIDVEN in the last 168 hours.  No results found for  this or any previous visit (from the past 240 hour(s)).       Radiology Studies: Dg Chest 2 View  Result Date: 08/31/2017 CLINICAL DATA:  Acute ischemic stroke EXAM: CHEST - 2 VIEW COMPARISON:  06/29/2017 FINDINGS: Stable cardiomegaly with aortic atherosclerosis. Left-sided pacemaker apparatus projects over the left shoulder with leads in the right atrium and right ventricle unchanged in appearance. No overt pulmonary edema. Hazy appearance of the left lung base noted likely due to differential soft tissues overlying the lung bases and left basilar atelectasis. No effusion is seen on the lateral view. There is no pneumothorax. Degenerative changes are present along the dorsal spine. IMPRESSION: Cardiomegaly with aortic atherosclerosis. No active pulmonary disease. Electronically Signed   By: Ashley Royalty [name]   On: 08/31/2017 01:31   Ct Head Wo Contrast  Result Date: 08/30/2017 CLINICAL DATA:  LEFT-sided facial droop. LEFT arm numbness. EXAM: CT HEAD WITHOUT CONTRAST TECHNIQUE: Contiguous axial images were obtained from the base of the skull through the vertex without intravenous contrast. COMPARISON:  Head CT 09/26/2016  FINDINGS: Brain: No acute intracranial hemorrhage. No focal mass lesion. No CT evidence of acute infarction. No midline shift or mass effect. No hydrocephalus. Basilar cisterns are patent. There are periventricular and subcortical white matter hypodensities. Generalized cortical atrophy. Vascular: No hyperdense vessel or unexpected calcification. Skull: Normal. Negative for fracture or focal lesion. Sinuses/Orbits: Paranasal sinuses and mastoid air cells are clear. Orbits are clear. Other: None. IMPRESSION: 1. No acute intracranial findings. 2. Atrophy and white matter microvascular disease. Electronically Signed   By: Suzy Bouchard [name]   On: 08/30/2017 21:39   Mr Brain Wo Contrast  Addendum Date: 08/31/2017   ADDENDUM REPORT: 08/31/2017 13:28 ADDENDUM: After further review,  there are 2 small areas of restricted diffusion in the right frontal cortex posteriorly compatible with acute infarct. Electronically Signed   By: Franchot Gallo [name]   On: 08/31/2017 13:28   Result Date: 08/31/2017 CLINICAL DATA:  Focal neuro deficit. Confusion. Left-sided weakness. EXAM: MRI HEAD WITHOUT CONTRAST TECHNIQUE: Multiplanar, multiecho pulse sequences of the brain and surrounding structures were obtained without intravenous contrast. COMPARISON:  CT head 08/30/2017 FINDINGS: Brain: Incomplete study. Diagnostic diffusion-weighted imaging, mildly degraded axial T2 imaging. The patient was not able to complete the study. Negative for acute infarct. Mild atrophy. No focal ischemic changes. Negative for mass or edema. Vascular: Normal arterial flow voids Skull and upper cervical spine: Negative Sinuses/Orbits: Mild mucosal edema paranasal sinuses. Bilateral cataract surgery Other: None IMPRESSION: No acute abnormality The patient was  not able to complete the study Electronically Signed: By: Franchot Gallo [name] On: 08/31/2017 10:41        Scheduled Meds: . amLODipine  10 mg Oral Daily  . aspirin  300 mg Rectal Daily   Or  . aspirin  325 mg Oral Daily  . atorvastatin  10 mg Oral q1800  . calcitRIOL  0.25 mcg Oral Daily  . carvedilol  25 mg Oral BID WC  . cholecalciferol  1,000 Units Oral QPM  . doxazosin  4 mg Oral Daily  . enoxaparin (LOVENOX) injection  40 mg Subcutaneous Q24H  . insulin aspart  0-15 Units Subcutaneous TID WC  . insulin aspart  4 Units Subcutaneous TID WC  . insulin glargine  15 Units Subcutaneous BID  . isosorbide mononitrate  60 mg Oral Daily  . lisinopril  5 mg Oral QPM  . pantoprazole  40 mg Oral Daily  . sertraline  100 mg Oral Daily  . torsemide  20 mg Oral BID  . vitamin B-12  100 mcg Oral Daily   Continuous Infusions:   LOS: 1 day    Kathie Dike, MD [facility] Pager [phone]  If 7PM-7AM, please contact  night-coverage [email or web address] Password [facility] 09/01/2017, 6:12 PM

## 2017-09-01 NOTE — Anesthesia Postprocedure Evaluation (Signed)
Anesthesia Post Note  Patient: David [name]  Procedure(s) Performed: TRANSESOPHAGEAL ECHOCARDIOGRAM (TEE) (N/A )     Patient location during evaluation: Endoscopy Anesthesia Type: MAC Level of consciousness: awake and alert Pain management: pain level controlled Vital Signs Assessment: post-procedure vital signs reviewed and stable Respiratory status: spontaneous breathing, nonlabored ventilation, respiratory function stable and patient connected to nasal cannula oxygen Cardiovascular status: stable and blood pressure returned to baseline Postop Assessment: no apparent nausea or vomiting Anesthetic complications: no    Last Vitals:  Vitals:   09/01/17 1352 09/01/17 1400  BP: (!) 101/48 (!) 123/40  Pulse: 91 61  Resp: 13 14  Temp: (!) 36.4 C   SpO2: 97% 94%    Last Pain:  Vitals:   09/01/17 1400  TempSrc:   PainSc: 0-No pain                 Adriona Kaney

## 2017-09-01 NOTE — Progress Notes (Signed)
SLP Cancellation Note  Patient Details Name: David [name] MRN: [id number] DOB: Sep 26, 1940   Cancelled treatment:       Reason Eval/Treat Not Completed: Patient at procedure or test/unavailable   Tora Prunty, Katherene Ponto 09/01/2017, 1:52 PM

## 2017-09-01 NOTE — CV Procedure (Signed)
TEE  Patient anesthetized with propofol by anesthesia  TEE advanced to mid esophagus without resistance   Findings:  LA, LAA without masses  No PFO by color doppler or with injection of agitated saline  TV normal  Trace TR MV normal  Trace MR AV mildly thickened, esp noncoronary cusp with mildly restricted motion   Trace AI PV normal  Trace PI  LVEF and RVEF normal  Mild fixed atherosclerotic plaquing of aorta.

## 2017-09-02 ENCOUNTER — Other Ambulatory Visit (HOSPITAL_COMMUNITY): Payer: Medicare Other

## 2017-09-02 ENCOUNTER — Inpatient Hospital Stay (HOSPITAL_COMMUNITY): Payer: Medicare Other

## 2017-09-02 DIAGNOSIS — I5189 Other ill-defined heart diseases: Secondary | ICD-10-CM

## 2017-09-02 DIAGNOSIS — I959 Hypotension, unspecified: Secondary | ICD-10-CM

## 2017-09-02 DIAGNOSIS — I421 Obstructive hypertrophic cardiomyopathy: Secondary | ICD-10-CM

## 2017-09-02 DIAGNOSIS — E1122 Type 2 diabetes mellitus with diabetic chronic kidney disease: Secondary | ICD-10-CM

## 2017-09-02 DIAGNOSIS — N183 Chronic kidney disease, stage 3 (moderate): Secondary | ICD-10-CM

## 2017-09-02 DIAGNOSIS — R55 Syncope and collapse: Secondary | ICD-10-CM

## 2017-09-02 DIAGNOSIS — D62 Acute posthemorrhagic anemia: Secondary | ICD-10-CM

## 2017-09-02 LAB — BLOOD GAS, ARTERIAL
Acid-Base Excess: 0.1 mmol/L (ref 0.0–2.0)
Bicarbonate: 24.8 mmol/L (ref 20.0–28.0)
Drawn by: 448981
FIO2: 21
O2 Saturation: 92.2 %
Patient temperature: 97.6
pCO2 arterial: 42.9 mmHg (ref 32.0–48.0)
pH, Arterial: 7.377 (ref 7.350–7.450)
pO2, Arterial: 65.5 mmHg — ABNORMAL LOW (ref 83.0–108.0)

## 2017-09-02 LAB — GLUCOSE, CAPILLARY
Glucose-Capillary: 182 mg/dL — ABNORMAL HIGH (ref 65–99)
Glucose-Capillary: 242 mg/dL — ABNORMAL HIGH (ref 65–99)
Glucose-Capillary: 202 mg/dL — ABNORMAL HIGH (ref 65–99)
Glucose-Capillary: 152 mg/dL — ABNORMAL HIGH (ref 65–99)

## 2017-09-02 LAB — BASIC METABOLIC PANEL
Anion gap: 12 (ref 5–15)
BUN: 51 mg/dL — ABNORMAL HIGH (ref 6–20)
CO2: 20 mmol/L — ABNORMAL LOW (ref 22–32)
Calcium: 8.5 mg/dL — ABNORMAL LOW (ref 8.9–10.3)
Chloride: 104 mmol/L (ref 101–111)
Creatinine, Ser: 2.41 mg/dL — ABNORMAL HIGH (ref 0.61–1.24)
GFR calc Af Amer: 28 mL/min — ABNORMAL LOW (ref >60)
GFR calc non Af Amer: 24 mL/min — ABNORMAL LOW (ref >60)
Glucose, Bld: 248 mg/dL — ABNORMAL HIGH (ref 65–99)
Potassium: 4.7 mmol/L (ref 3.5–5.1)
Sodium: 136 mmol/L (ref 135–145)

## 2017-09-02 LAB — CBC
HCT: 33.6 % — ABNORMAL LOW (ref 39.0–52.0)
Hemoglobin: 11.2 g/dL — ABNORMAL LOW (ref 13.0–17.0)
MCH: 30.7 pg (ref 26.0–34.0)
MCHC: 33.3 g/dL (ref 30.0–36.0)
MCV: 92.1 fL (ref 78.0–100.0)
Platelets: 159 10*3/uL (ref 150–400)
RBC: 3.65 MIL/uL — ABNORMAL LOW (ref 4.22–5.81)
RDW: 15 % (ref 11.5–15.5)
WBC: 7.1 10*3/uL (ref 4.0–10.5)

## 2017-09-02 LAB — MRSA PCR SCREENING: MRSA by PCR: NEGATIVE

## 2017-09-02 LAB — MAGNESIUM: Magnesium: 2.1 mg/dL (ref 1.7–2.4)

## 2017-09-02 LAB — TROPONIN I
Troponin I: 0.04 ng/mL (ref <0.03)
Troponin I: 0.07 ng/mL (ref <0.03)

## 2017-09-02 MED ORDER — SODIUM CHLORIDE 0.9 % IV SOLN
INTRAVENOUS | Status: DC
  Administered 2017-09-02: 16:00:00 via INTRAVENOUS

## 2017-09-02 MED ORDER — ATROPINE SULFATE 1 MG/10ML IJ SOSY
PREFILLED_SYRINGE | INTRAMUSCULAR | Status: AC
  Filled 2017-09-02: qty 10

## 2017-09-02 MED ORDER — SODIUM CHLORIDE 0.9 % IV BOLUS
1000.0000 mL | Freq: Once | INTRAVENOUS | Status: AC
  Administered 2017-09-02: 1000 mL via INTRAVENOUS

## 2017-09-02 MED FILL — Medication: Qty: 1 | Status: AC

## 2017-09-02 NOTE — Progress Notes (Signed)
PT Cancellation Note  Patient Details Name: David [name] MRN: [id number] DOB: 03-May-1941   Cancelled Treatment:    Reason Eval/Treat Not Completed: Medical issues which prohibited therapy.  Rapid response and code blue called just prior to arrival.  Pt responding well, but treatment deferred. 09/02/2017  Donnella Sham, PT [phone]  (pager)   [name] 09/02/2017, 2:43 PM

## 2017-09-02 NOTE — Code Documentation (Addendum)
  Patient Name: David [name]   MRN: [id number]   Date of Birth/ Sex: 1941/03/24 , male      Admission Date: 08/30/2017  Attending Provider: Kathie Dike, MD  Primary Diagnosis: Acute ischemic stroke [facility])   Indication: Pt was in his usual state of health until this AM, when he was noted to be pulseless and unresponsive on the floor while ambulating to the restroom. Code blue was subsequently called following a dose of Atropine. At the time of arrival on scene, ACLS protocol had been stopped following 1 min of CPR when the patient awoke.   Technical Description:  - CPR performance duration:  1 minute  - Was defibrillation or cardioversion used? No   - Was external pacer placed? No  - Was patient intubated pre/post CPR? No   Medications Administered: Y = Yes; Blank = No Amiodarone    Atropine  Y  Calcium    Epinephrine    Lidocaine    Magnesium    Norepinephrine    Phenylephrine    Sodium bicarbonate    Vasopressin     Post CPR evaluation:  - Final Status - Was patient successfully resuscitated ? Yes - What is current rhythm? NSR - What is current hemodynamic status? Stable  Miscellaneous Information:  - Labs sent, including: n/a  - Primary team notified?  Yes  - Family Notified? Yes  - Additional notes/ transfer status: n/a     Kathi Ludwig, MD  09/02/2017, 2:58 PM

## 2017-09-02 NOTE — Progress Notes (Addendum)
PROGRESS NOTE    David [name]  SKA:[id number] DOB: 02/25/1941 DOA: 08/30/2017 PCP: [name], MD    Brief Narrative:  77 year old male with a history of diabetes on insulin pump, hypertension, hyperlipidemia, chronic kidney disease stage III, presented with 2-day history of left hand weakness along with speech impairment and left-sided facial droop.  Found to have embolic right MCA distribution infarct.  Seen by stroke service and recommendations for dual antiplatelet therapy.  Seen by physical therapy recommended CIR.  On 4/20 patient was using the restroom and became lethargic and hypotensive with blood pressure in the 60-70s. He became unresponsive and staff could not palpate a pulse. He received one dose of atropine and 1 min of chest compressions. He did not require intubation and is now awake. Suspect hypotension related to volume depletion. Started on IV fluids. Critical care consulted. EKG showed no changes. Labs pending.   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 Acute ischemic stroke.  Patient found to have right embolic right frontal MCA branch infarct, etiology undetermined.  Transesophageal  echocardiogram did not show any cardiac source of embolism.  Recommendations per neurology are for dual antiplatelet therapy of aspirin and Plavix for 3 weeks followed by Plavix alone.  He was seen by physical therapy and occupational therapy who recommended CIR placement.  Currently he is being evaluated by rehab team and is a potential to go to CIR.  LDL above goal at 106 and he is been started on Lipitor.  A1c is 7.6.  Per cardiology notes, pacemaker was interrogated and did not show any new atrial fibrillation. 2. Hyperlipidemia.  LDL of 106.  Started on Lipitor 3. Chronic kidney disease stage III due to diabetes.  Renal function is stable.  Creatinine is near baseline. 4. Diabetes.  A1c of 7.6.  Currently on sliding scale.  On insulin pump at home. 5. Hypertension.  On multiple medications including amlodipine, lisinopril, Imdur, Coreg, torsemide.  Blood pressure currently stable. 6. Obstructive sleep apnea.  Continue on CPAP 7. Obesity.  BMI 34.9.  Counseled on the importance of diet and exercise. 8. Hypertrophic cardiomyopathy status post pacemaker. 9. Syncope. Patient had a syncopal event today with associated hypotension. Blood pressure noted to be in the 60s-70s. Staff could not palpate pulse and he received chest compressions/atropine for 1 min. He is now awake and communicating. Suspect that he became hypotensive due to volume depletion leading to syncope. Will check stat labs including cbc, bmet, abg and troponin. EKG done and I have personally reviewed tracing. Clear pacer spikes noted. No significant change from prior EKGs. He has been started on IV fluids and will continue to monitor blood pressure closely. Will change  to stepdown status.    DVT prophylaxis: Lovenox Code Status: Full code Family Communication: Discussed with daughter at the bedside Disposition Plan: Pending possible discharge to CIR   Consultants:   Neurology  Cardiology  Procedures:   Transesophageal  echocardiogram.  No source of cardiac embolus or PFO  Antimicrobials:       Subjective: Informed by staff that patient had gotten up to use the bathroom this morning and suddenly became lethargic and unresponsive. Per staff, a pulse could not be palpated. He did have a rhythm on telemetry monitor, but sbp was noted to be in the 70s. He received 1 min of CPR and a dose of atropine, and then woke up and began communicating. He denies any chest pain or shortness of breath at this time.  Objective: Vitals:   09/02/17 0400 09/02/17 0404 09/02/17 0800 09/02/17 0917  BP: 90/74 (!) 148/62  (!) 130/46  Pulse: 60   60  Resp: 12   17  Temp:  97.6 F (36.4 C) 97.9 F (36.6 C)   TempSrc:  Oral Oral   SpO2: 95%   97%  Weight:      Height:        Intake/Output Summary (Last 24 hours) at 09/02/2017 1204 Last data filed at 09/02/2017 0110 Gross per 24 hour  Intake 760.51 ml  Output 1300 ml  Net -539.49 ml   Filed Weights   08/30/17 2108  Weight: 130.2 kg (287 lb)    Examination:  General exam: somnolent, wakes up to voice, no distress Respiratory system: Clear to auscultation. Respiratory effort normal. Cardiovascular system:RRR. No murmurs, rubs, gallops. Gastrointestinal system: Abdomen is nondistended, soft and nontender. No organomegaly or masses felt. Normal bowel sounds heard. Central nervous system: limited exam due to mental status, strength seems equal bilaterally Extremities: No C/C/E, +pedal pulses Skin: No rashes, lesions or ulcers Psychiatry: somonlent, wakes up to voice, appropriate  Data Reviewed: I have personally reviewed following labs and imaging studies  CBC: Recent Labs  Lab 08/30/17 2057  WBC 8.6  NEUTROABS 6.0  HGB 11.5*  HCT 34.8*  MCV 91.1  PLT 710   Basic Metabolic Panel: Recent Labs  Lab 08/30/17 2057  NA 139  K 4.7  CL 108  CO2 23  GLUCOSE 136*  BUN 33*  CREATININE 1.95*  CALCIUM 9.5   GFR: Estimated Creatinine Clearance: 46.8 mL/min  (A) (by C-G formula based on SCr of 1.95 mg/dL (H)). Liver Function Tests: No results for input(s): AST, ALT, ALKPHOS, BILITOT, PROT, ALBUMIN in the last 168 hours. No results for input(s): LIPASE, AMYLASE in the last 168 hours. No results for input(s): AMMONIA in the last 168 hours. Coagulation Profile: No results for input(s): INR, PROTIME in the last 168 hours. Cardiac Enzymes: No results for input(s): CKTOTAL, CKMB, CKMBINDEX, TROPONINI in the last 168 hours. BNP (last 3 results) No results for input(s): PROBNP in the last 8760 hours. HbA1C: Recent Labs    08/31/17 0500  HGBA1C 7.6*   CBG: Recent Labs  Lab 09/01/17 1256 09/01/17 1750 09/01/17 2111 09/02/17 0831 09/02/17 1124  GLUCAP 215* 194* 168* 182* 242*   Lipid Profile: Recent Labs    08/31/17 0500  CHOL 165  HDL 20*  LDLCALC 106*  TRIG 195*  CHOLHDL 8.3   Thyroid Function Tests: No results for input(s): TSH, T4TOTAL, FREET4, T3FREE, THYROIDAB in the last 72 hours. Anemia Panel: No results for input(s): VITAMINB12, FOLATE, FERRITIN, TIBC, IRON, RETICCTPCT in the last 72 hours. Sepsis Labs:  No results for input(s): PROCALCITON, LATICACIDVEN in the last 168 hours.  Recent Results (from the past 240 hour(s))  MRSA PCR Screening     Status: None   Collection Time: 09/02/17  6:35 AM  Result Value Ref Range Status   MRSA by PCR NEGATIVE NEGATIVE Final    Comment:        The GeneXpert MRSA Assay (FDA approved for NASAL specimens only), is one component of a comprehensive MRSA colonization surveillance program. It is not intended to diagnose MRSA infection nor to guide or monitor treatment for MRSA infections. Performed at [facility], [place] [address], [place], [place] [address]          Radiology Studies: No results found.      Scheduled Meds: . amLODipine  10 mg Oral Daily  . aspirin  300 mg Rectal Daily   Or  . aspirin  325 mg Oral Daily  . atorvastatin  10 mg Oral q1800  .  atropine      . calcitRIOL  0.25 mcg Oral Daily  . carvedilol  25 mg Oral BID WC  . cholecalciferol  1,000 Units Oral QPM  . doxazosin  4 mg Oral Daily  . enoxaparin (LOVENOX) injection  40 mg Subcutaneous Q24H  . insulin aspart  0-15 Units Subcutaneous TID WC  . insulin aspart  4 Units Subcutaneous TID WC  . insulin glargine  15 Units Subcutaneous BID  . isosorbide mononitrate  60 mg Oral Daily  . pantoprazole  40 mg Oral Daily  . sertraline  100 mg Oral Daily  . torsemide  20 mg Oral BID  . vitamin B-12  100 mcg Oral Daily   Continuous Infusions:   LOS: 2 days    Critical care: 13mins  Kathie Dike, MD [facility] Pager [phone]  If 7PM-7AM, please contact night-coverage [email or web address] Password [facility] 09/02/2017, 12:04 PM

## 2017-09-02 NOTE — Progress Notes (Addendum)
VAST/IV Responding Ref: CODE BLUE  VAST/IV RN Christoper Fabian and [place] responding to code blue. Code blue cancelled and VAST/IV Not needed per [employer]

## 2017-09-02 NOTE — Progress Notes (Signed)
SLP Cancellation Note  Patient Details Name: CLINTON DRAGONE MRN: [id number] DOB: [birth year]   Cancelled treatment:       Reason Eval/Treat Not Completed: Medical issues which prohibited therapy. Rapid response and code blue called just prior to arrival.  Pt responding well, but treatment deferred. Will defer cognitive evaluation, f/u next date if appropriate.  Deneise Lever, [place], [place] Speech-Language Pathologist [phone]  Aliene Altes 09/02/2017, 3:06 PM

## 2017-09-02 NOTE — Progress Notes (Signed)
1115- Pt sitting in bathroom having a bowel movement.  Nurse tech bathing patient, remained with patient 100% of the time. Called for RN to bring walker to assist back to bed.  Upon arrival to room pt was slumped over leaning against the bathroom wall.  Unresponsive to noxious stimuli.  Rapid response called.  Respirations are even, unlabored.  Cardiac monitor shows atrial paced tachycardia.  Rapid nurse responded, patient was found to have no palpable pulse, eased patient to floor and began chest compressions.  1120- Code blue paged. Patient responsive after 1 round of CPR. Code team, House supervisor, and chaplin at bedside. Transferred pt back to back manually lifting with lift pad.  Patient alert and oriented x 2 initially, then lethargic, responsive to loud vocal stimuli.  Fluid bolus initiated. Family at bedside, updated.

## 2017-09-02 NOTE — Progress Notes (Signed)
   09/02/17 1900  Clinical Encounter Type  Visited With Patient;Patient and family together  Visit Type Initial  Referral From Nurse  Consult/Referral To Chaplain  Spiritual Encounters  Spiritual Needs Emotional  Stress Factors  Patient Stress Factors Exhausted    Patient coded while moving from bathroom to bed due to low vitals. Large group of medical staff were on-site helping him. Wife was present in emotional distress. Daughter was called and arrived in about 30 minutes. Chaplain provided emotional support through empathic reflective listening and compassionate presence.  Ladavia Lindenbaum a Medical sales representative, [employer]

## 2017-09-02 NOTE — Consult Note (Signed)
Initial Pulmonary/Critical Care Consultation  Patient Name: David [name] MRN: [id number] DOB: [birth year]    ADMISSION DATE:  08/30/2017 CONSULTATION DATE: 09/02/2017  REFERRING MD: Dr. Kathie Dike  REASON FOR CONSULTATION: Post CPR surveillance, hypotension   HISTORY OF PRESENT ILLNESS  This 77 y.o. obese Caucasian male is seen in consultation at the request of Dr. Kathie Dike for recommendations on further evaluation and management of hypotension and post CPR surveillance.  The specific request to put to the service is a request to transfer the patient to ICU level of care for monitoring.  He is currently in a stepdown unit, where he is undergoing continuous cardiac monitoring, frequent blood pressure checks and continuous oximetry.  The patient was originally admitted on 08/30/2017 with concerns for stroke.  Indeed, the patient was diagnosed with an ischemic right frontal MCA stroke, which has been medically managed along with physical therapy.  The patient was approaching disposition to inpatient rehabilitation.  However, earlier this morning, the patient experienced dizziness and had a syncopal episode while returning from the bathroom.  Apparently, he became pulseless while moving from the bathroom back to the bed.  Based on discussion with nurse and review of available documentation, the patient was resuscitated with 1 dose of atropine and 1 minute of chest compressions.  At the time of clinical interview, the patient is awake, alert and oriented to time person and place.  He is moving all 4 extremities.  He is a little slow to respond (particularly when trying to recall the year and POTUS).  However, he is currently normotensive and on normal saline infusion at 50 mL/h.  He is not requiring vasopressor administration.  He is on room air with an SPO2 of 99-100%.  Of note, the patient has hypertrophic obstructive cardiomyopathy.  He has been n.p.o. subsequent to his stroke and was also  undergoing diuretic therapy due to lower extremity edema and perceived orthopnea leading up to this morning's episode.  REVIEW OF SYSTEMS Constitutional: No night sweats. No fever. No chills. No fatigue. HEENT: No headaches, dysphagia, sore throat, otalgia, nasal congestion, postnasal drip CV:  No chest pain, orthopnea, PND, swelling in lower extremities, palpitations GI:  No abdominal pain, nausea, vomiting, diarrhea, change in bowel pattern, anorexia Resp: No DOE, rest dyspnea, cough, mucus, hemoptysis, wheezing  GU: Oliguria.  No change in color of urine, no urgency or frequency.  No flank pain. MS:  No joint pain or swelling. No myalgias,  No decreased range of motion.  Psych:  No change in mood or affect. No memory loss. Skin: no rash or lesions.   PAST MEDICAL/SURGICAL/SOCIAL/FAMILY HISTORIES   Past Medical History:  Diagnosis Date  . Anemia, iron deficiency   . Anxiety   . Arthritis   . BPH (benign prostatic hypertrophy)   . CAD (coronary artery disease)    Nonobstructive CAD per cath  . Cardiac pacemaker in situ   . CHF (congestive heart failure) ([place])   . Chronic ulcer of right foot ([place])   . CKD (chronic kidney disease), stage III ([place]) secondary to DM and HTN   nephrologist-  [name]  . Dyspnea   . History of cellulitis    right great toe 10-25-2014  . History of skin cancer   . HOCM (hypertrophic obstructive cardiomyopathy) ([place])   . Hypertension   . 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 Insulin pump in place   . OSA  on CPAP   . Peripheral neuropathy    severe  . Peripheral vascular disease ([place])    bilateral lower extremities  . Rib fracture 07/24/2015  . Secondary hyperparathyroidism of renal origin ([place])   . Sinus node dysfunction ([facility])   . Sleep apnea     Past Surgical History:  Procedure Laterality Date  . 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  AMPUTATION TOE Right 03/12/2015   Procedure: RIGHT HALLUS AMPUTATION ;  Surgeon: [name], MD;  Location: [facility];  Service: Podiatry;  Laterality: Right;  . CARDIAC CATHETERIZATION  11-25-2010   [place], [place]   Nonobstructive CAD  . CARDIAC PACEMAKER PLACEMENT  Nov 2009   [employer]  . CHOLECYSTECTOMY N/A 03/05/2017   Procedure: LAPAROSCOPIC CHOLECYSTECTOMY WITH INTRAOPERATIVE CHOLANGIOGRAM;  Surgeon: [name], MD;  Location: [facility];  Service: General;  Laterality: N/A;  . EP IMPLANTABLE DEVICE N/A 06/03/2015   Procedure: PPM Generator Changeout;  Surgeon: [name], MD;  Location: [facility];  Service: Cardiovascular;  Laterality: N/A;  . EXCISION BONE CYST Right 03/06/2015   Procedure: BONE BIOPSIES OF RIGHT FOOT;  Surgeon: [name], MD;  Location: [facility];  Service: Podiatry;  Laterality: Right;  . ORIF ANKLE FRACTURE Left 11/06/2014   Procedure: OPEN REDUCTION INTERNAL FIXATION (ORIF) LEFT  ANKLE FRACTURE;  Surgeon: [name], MD;  Location: [facility];  Service: Orthopedics;  Laterality: Left;  . TOTAL KNEE ARTHROPLASTY    . VEIN LIGATION AND STRIPPING      Social History   Tobacco Use  . Smoking status: Former Smoker    Packs/day: 2.00    Years: 30.00    Pack years: 60.00    Last attempt to quit: 03/03/1984    Years since quitting: 33.5  . Smokeless tobacco: Former Network engineer Use Topics  . Alcohol use: Yes    Alcohol/week: 0.6 oz    Types: 1 Glasses of wine per week    Comment: social    Family History  Problem Relation Age of Onset  . Cancer Mother        breast  . Heart attack Father      Allergies  Allergen Reactions  . Adhesive [Tape]     blisters     Prior to Admission medications   Medication Sig Start Date End Date Taking? Authorizing Provider  amLODipine (NORVASC) 10 MG tablet Take 1 tablet (10 mg total) by mouth daily. 04/28/17  Yes [name], MD  aspirin EC  81 MG tablet Take 81 mg by mouth daily.   Yes [provider]  calcitRIOL (ROCALTROL) 0.25 MCG capsule Take 0.25 mcg by mouth daily.  05/30/14  Yes [provider]  carvedilol (COREG) 25 MG tablet Take 1 tablet (25 mg total) by mouth 2 (two) times daily with a meal. 08/15/17  Yes [name], MD  cholecalciferol (VITAMIN D) 1000 units tablet Take 1 tablet (1,000 Units total) by mouth daily. Patient taking differently: Take 1,000 Units by mouth every evening.  02/01/17  Yes [name], MD  doxazosin (CARDURA) 4 MG tablet TAKE 1 TABLET BY MOUTH ONCE DAILY 07/23/17  Yes Elayne Snare, MD  hydrOXYzine (ATARAX/VISTARIL) 50 MG tablet Take 1 tablet (50 mg total) 3 (three) times daily as needed by mouth. 03/22/17  Yes [name], MD  Icosapent Ethyl (VASCEPA) 1 g CAPS 2 caps bid 02/22/17  Yes  Elayne Snare, MD  insulin regular human CONCENTRATED (HUMULIN R) 500 UNIT/ML injection USE UP TO 0.5 ML IN INSULIN PUMP daily. 04/26/17  Yes Elayne Snare, MD  isosorbide mononitrate (IMDUR) 30 MG 24 hr tablet TAKE TWO TABLETS BY MOUTH ONCE DAILY 11/07/16  Yes [name], MD  liraglutide (VICTOZA) 18 MG/3ML SOPN INJECT 0.3MLS (1.8 MG TOTAL) INTO THE SKIN DAILY 05/23/16  Yes Elayne Snare, MD  lisinopril (PRINIVIL,ZESTRIL) 5 MG tablet Take 5 mg by mouth every evening.  07/15/15  Yes [provider]  Omega-3 Fatty Acids (FISH OIL) 1000 MG CAPS Take by mouth 2 (two) times daily.   Yes [provider]  pantoprazole (PROTONIX) 40 MG tablet TAKE ONE TABLET BY MOUTH ONCE DAILY 02/07/17  Yes [name], FNP  sertraline (ZOLOFT) 100 MG tablet Take 100 mg by mouth daily.   Yes [provider]  torsemide (DEMADEX) 20 MG tablet Take 20 mg by mouth 2 (two) times daily.    Yes [provider]  traMADol (ULTRAM) 50 MG tablet Take 1-2 tablets (50-100 mg total) by mouth every 6 (six) hours as needed for moderate pain or severe pain. 03/05/17  Yes [name], MD  vitamin  B-12 (CYANOCOBALAMIN) 100 MCG tablet Take 100 mcg by mouth daily.   Yes [provider]  insulin regular human CONCENTRATED (HUMULIN R) 500 UNIT/ML injection USE 0.25 ML DAILY OR AS DIRECTED IN INSULIN PUMP E11.65 Patient not taking: Reported on 08/30/2017 06/06/17   Elayne Snare, MD    Current Facility-Administered Medications  Medication Dose Route Frequency Provider Last Rate Last Dose  . 0.9 %  sodium chloride infusion   Intravenous Continuous Collene Gobble, MD 50 mL/hr at 09/02/17 1615    . acetaminophen (TYLENOL) tablet 650 mg  650 mg Oral Q4H PRN Kathie Dike, MD       Or  . acetaminophen (TYLENOL) solution 650 mg  650 mg Per Tube Q4H PRN Kathie Dike, MD       Or  . acetaminophen (TYLENOL) suppository 650 mg  650 mg Rectal Q4H PRN Kathie Dike, MD      . aspirin suppository 300 mg  300 mg Rectal Daily Kathie Dike, MD       Or  . aspirin tablet 325 mg  325 mg Oral Daily Kathie Dike, MD   325 mg at 09/02/17 0921  . atorvastatin (LIPITOR) tablet 10 mg  10 mg Oral q1800 Kathie Dike, MD   10 mg at 09/01/17 1809  . atropine 1 MG/10ML injection           . calcitRIOL (ROCALTROL) capsule 0.25 mcg  0.25 mcg Oral Daily Kathie Dike, MD   0.25 mcg at 09/02/17 0921  . cholecalciferol (VITAMIN D) tablet 1,000 Units  1,000 Units Oral QPM Kathie Dike, MD   1,000 Units at 09/01/17 1808  . enoxaparin (LOVENOX) injection 40 mg  40 mg Subcutaneous Q24H Kathie Dike, MD   40 mg at 09/01/17 1807  . hydrOXYzine (ATARAX/VISTARIL) tablet 50 mg  50 mg Oral TID PRN Kathie Dike, MD   50 mg at 09/01/17 1809  . insulin aspart (novoLOG) injection 0-15 Units  0-15 Units Subcutaneous TID WC Kathie Dike, MD   3 Units at 09/02/17 0919  . insulin aspart (novoLOG) injection 4 Units  4 Units Subcutaneous TID WC Kathie Dike, MD   4 Units at 09/02/17 0919  . insulin glargine (LANTUS) injection 15 Units  15 Units Subcutaneous BID Kathie Dike, MD  15 Units at 09/02/17  1100  . pantoprazole (PROTONIX) EC tablet 40 mg  40 mg Oral Daily Kathie Dike, MD   40 mg at 09/02/17 0921  . senna-docusate (Senokot-S) tablet 1 tablet  1 tablet Oral QHS PRN Kathie Dike, MD      . sertraline (ZOLOFT) tablet 100 mg  100 mg Oral Daily Kathie Dike, MD   100 mg at 09/02/17 0921  . traMADol (ULTRAM) tablet 50-100 mg  50-100 mg Oral Q6H PRN Kathie Dike, MD      . vitamin B-12 (CYANOCOBALAMIN) tablet 100 mcg  100 mcg Oral Daily Kathie Dike, MD   100 mcg at 09/02/17 0921     VITAL SIGNS: BP (!) 102/50   Pulse 60   Temp 97.9 F (36.6 C) (Oral)   Resp 13   Ht 6\' 4"  (1.93 m)   Wt 130.2 kg (287 lb)   SpO2 97%   BMI 34.93 kg/m   INTAKE / OUTPUT: I/O last 3 completed shifts: In: 760.5 [P.O.:600; I.V.:160.5] Out: 1950 [IPJAS:5053]  PHYSICAL EXAMINATION: GENERAL: Pleasant. Well-developed. Alert, awake and oriented to time, person and place. Cooperative. No acute distress. HEAD: normocephalic, atraumatic EYE: PERRLA, EOM intact, no scleral icterus, no pallor. NOSE: nares are patent. No polyps. No exudate. No sinus tenderness. THROAT/ORAL CAVITY: Normal dentition. No oral thrush. No exudate. Mucous membranes are moist. No tonsillar enlargement. Mallampati class III (soft and hard palate and base of uvula visible) airway. NECK: supple, no thyromegaly, no JVD, no lymphadenopathy. Trachea midline. CHEST/LUNG: symmetric in development and expansion. Good air entry. no crackles. No wheezes. HEART: Regular S1 and S2 without murmur, rub or gallop. ABDOMEN: soft, nontender, nondistended. Normoactive bowel sounds. No rebound. No guarding. No hepatosplenomegaly. EXTREMITIES: Edema: none. No cyanosis.  No clubbing. 2+ DP pulses LYMPHATIC: no cervical/axiallary/inguinal lymph nodes appreciated MUSCULOSKELETAL: no joint tenderness, deformity or swelling. SKIN: No rash or lesion. NEUROLOGIC: Doll's eyes intact. Corneal reflex intact. Spontaneous respirations intact.  Cranial nerves II-XII are grossly symmetric and physiologic. Babinski absent. No sensory deficit. Motor: 5/5 @ RUE, 5/5 @ LUE, 5/5 @ RLL,  5/5 @ LLL.  DTR: 2+ @ R biceps, 2+ @ L biceps, 2+ @ R patellar,  2+ @ L patellar. No cerebellar signs. Gait was not assessed.   LABS:  ABG Recent Labs  Lab 09/02/17 1250  PHART 7.377  PCO2ART 42.9  PO2ART 65.5*    Cardiac Enzymes Recent Labs  Lab 09/02/17 1209  TROPONINI 0.04*    BASIC METABOLIC PROFILE Recent Labs  Lab 08/30/17 2057 09/02/17 1209 09/02/17 1423  NA 139 136  --   K 4.7 4.7  --   CL 108 104  --   CO2 23 20*  --   BUN 33* 51*  --   CREATININE 1.95* 2.41*  --   GLUCOSE 136* 248*  --   CALCIUM 9.5 8.5*  --   MG  --   --  2.1    Glucose Recent Labs  Lab 09/01/17 0753 09/01/17 1256 09/01/17 1750 09/01/17 2111 09/02/17 0831 09/02/17 1124  GLUCAP 222* 215* 194* 168* 182* 242*    Liver Enzymes No results for input(s): AST, ALT, ALKPHOS, BILITOT, ALBUMIN in the last 168 hours.  CBC Recent Labs  Lab 08/30/17 2057 09/02/17 1209  WBC 8.6 7.1  HGB 11.5* 11.2*  HCT 34.8* 33.6*  PLT 190 159    COAGULATION STUDIES No results for input(s): APTT, INR in the last 168 hours.  SEPSIS MARKERS No results for input(s):  LATICACIDVEN, PROCALCITON, O2SATVEN in the last 168 hours.  CULTURES: Results for orders placed or performed during the hospital encounter of 08/30/17  MRSA PCR Screening     Status: None   Collection Time: 09/02/17  6:35 AM  Result Value Ref Range Status   MRSA by PCR NEGATIVE NEGATIVE Final    Comment:        The GeneXpert MRSA Assay (FDA approved for NASAL specimens only), is one component of a comprehensive MRSA colonization surveillance program. It is not intended to diagnose MRSA infection nor to guide or monitor treatment for MRSA infections. Performed at [facility], [place] [address], [place], [place] [address]      IMAGING: Dg Chest Port 1 View  Result Date:  09/02/2017 CLINICAL DATA:  Shortness of breath for 1 day.  Syncopal episode. EXAM: PORTABLE CHEST 1 VIEW COMPARISON:  CT chest 07/23/2015. PA and lateral chest 08/31/2017 and 06/29/2017. FINDINGS: Pacing device is unchanged. Heart size is upper normal. Aortic atherosclerosis is seen. The lungs appear hyperexpanded but are clear. Prominent epicardial fat at the left cardiophrenic angle noted. No acute bony abnormality. IMPRESSION: No acute disease. Atherosclerosis. Pulmonary hyperexpansion suggestive of emphysema. Electronically Signed   By: Inge Rise [name]   On: 09/02/2017 14:35    ASSESSMENT / PLAN: Principal Problem:   Acute ischemic stroke ([place]) Active Problems:   Hypertension associated with diabetes ([place])   Hyperlipidemia associated with type 2 diabetes mellitus ([place])   CKD stage 3 due to type 2 diabetes mellitus ([place])   Diabetes mellitus with neurological manifestations, uncontrolled ([place])   OSA on CPAP   Pacemaker   HOCM (hypertrophic obstructive cardiomyopathy) ([place])   IDDM (insulin dependent diabetes mellitus) - on insulin pump   Agitation   Restless   Diastolic dysfunction   Coronary artery disease involving native coronary artery of native heart without angina pectoris   Cardiac pacemaker in situ   Stage 3 chronic kidney disease (HCC)   Atrial fibrillation with rapid ventricular response (H[facility])   Acute blood loss anemia   PVD (peripheral vascular disease) ([facility])   Tachypnea  Clinically, this patient's experience is suspicious for orthostatic hypotension.  I suspect his hypotension is related to intravascular volume depletion.  It is noted that the patient has improved steadily throughout the day with IV fluid administration.  However, with his underlying hypertrophic obstructive cardiomyopathy, he is likely to have a narrow "therapeutic range" with IV fluids.  The nurse has reported that the patient's urine output has remained low throughout the day despite improvement in his  blood pressure.  Check orthostatic pressures.  If positive, continue IV fluid administration at current rate of 50 mL/h.  If negative, discontinue intravenous fluid administration.  Resume p.o. intake.  Bladder scan.  If there is evidence of urinary retention, may need to consider straight cath.  Avoid excessive diuresis  Chest x-ray in a.m.  DISPOSITION: Stay in intermediate care/stepdown unit.  FAMILY  - Updates: Wife and daughter at the bedside, updated   Renee Pain, MD Board Certified by the ABIM, [place] Pager: [phone]  09/02/2017, 5:30 PM

## 2017-09-02 NOTE — Progress Notes (Signed)
Pt placed self on home CPAP for the night.

## 2017-09-02 NOTE — Progress Notes (Signed)
PCCM Brief Note  Called by RN regarding pt's IVF orders.   77 yo man with DM, HTN, CKD3, admitted with R MCA dist CVA. This am he had a syncopal episode while in bathroom, likely vasovagal +/- related to volume status.   He has had relative hypotension since the event, received fluid boluses x 2, although I do not see this recorded in the daily I/o total. If he received 2L in bolused fluid then he should be slightly positive for the last 3 days total.   I believe it should be Ok to continue maintenance IVF for another day. 50cc/h ordered.   CXR does not show any evidence volume overload.   Baltazar Apo, MD, PhD 09/02/2017, 3:54 PM [facility] [phone] or if no answer [phone]

## 2017-09-03 ENCOUNTER — Other Ambulatory Visit: Payer: Self-pay

## 2017-09-03 ENCOUNTER — Encounter (HOSPITAL_COMMUNITY): Payer: Self-pay | Admitting: Internal Medicine

## 2017-09-03 DIAGNOSIS — E1165 Type 2 diabetes mellitus with hyperglycemia: Secondary | ICD-10-CM

## 2017-09-03 DIAGNOSIS — I10 Essential (primary) hypertension: Secondary | ICD-10-CM

## 2017-09-03 DIAGNOSIS — E1159 Type 2 diabetes mellitus with other circulatory complications: Secondary | ICD-10-CM

## 2017-09-03 DIAGNOSIS — E1149 Type 2 diabetes mellitus with other diabetic neurological complication: Secondary | ICD-10-CM

## 2017-09-03 DIAGNOSIS — I472 Ventricular tachycardia: Secondary | ICD-10-CM

## 2017-09-03 LAB — BASIC METABOLIC PANEL
Anion gap: 10 (ref 5–15)
BUN: 52 mg/dL — ABNORMAL HIGH (ref 6–20)
CO2: 22 mmol/L (ref 22–32)
Calcium: 8.4 mg/dL — ABNORMAL LOW (ref 8.9–10.3)
Chloride: 106 mmol/L (ref 101–111)
Creatinine, Ser: 2.39 mg/dL — ABNORMAL HIGH (ref 0.61–1.24)
GFR calc Af Amer: 28 mL/min — ABNORMAL LOW (ref >60)
GFR calc non Af Amer: 25 mL/min — ABNORMAL LOW (ref >60)
Glucose, Bld: 147 mg/dL — ABNORMAL HIGH (ref 65–99)
Potassium: 4.2 mmol/L (ref 3.5–5.1)
Sodium: 138 mmol/L (ref 135–145)

## 2017-09-03 LAB — CBC
HCT: 33.9 % — ABNORMAL LOW (ref 39.0–52.0)
Hemoglobin: 11 g/dL — ABNORMAL LOW (ref 13.0–17.0)
MCH: 29.6 pg (ref 26.0–34.0)
MCHC: 32.4 g/dL (ref 30.0–36.0)
MCV: 91.1 fL (ref 78.0–100.0)
Platelets: 170 10*3/uL (ref 150–400)
RBC: 3.72 MIL/uL — ABNORMAL LOW (ref 4.22–5.81)
RDW: 14.8 % (ref 11.5–15.5)
WBC: 7.2 10*3/uL (ref 4.0–10.5)

## 2017-09-03 LAB — MAGNESIUM: Magnesium: 2 mg/dL (ref 1.7–2.4)

## 2017-09-03 LAB — GLUCOSE, CAPILLARY
Glucose-Capillary: 207 mg/dL — ABNORMAL HIGH (ref 65–99)
Glucose-Capillary: 220 mg/dL — ABNORMAL HIGH (ref 65–99)
Glucose-Capillary: 146 mg/dL — ABNORMAL HIGH (ref 65–99)
Glucose-Capillary: 138 mg/dL — ABNORMAL HIGH (ref 65–99)

## 2017-09-03 LAB — TROPONIN I: Troponin I: 0.04 ng/mL (ref <0.03)

## 2017-09-03 MED ORDER — CARVEDILOL 12.5 MG PO TABS
12.5000 mg | ORAL_TABLET | Freq: Two times a day (BID) | ORAL | Status: DC
  Administered 2017-09-03: 12.5 mg via ORAL
  Filled 2017-09-03: qty 1

## 2017-09-03 NOTE — Evaluation (Signed)
Speech Language Pathology Evaluation Patient Details Name: David [name] MRN: [id number] DOB: November 10, 1940 Today's Date: 09/03/2017 Time: 0240-9735 SLP Time Calculation (min) (ACUTE ONLY): 25 min  Problem List:  Patient Active Problem List   Diagnosis Date Noted  . Agitation   . Restless   . Diastolic dysfunction   . Coronary artery disease involving native coronary artery of native heart without angina pectoris   . Cardiac pacemaker in situ   . Stage 3 chronic kidney disease ([place])   . Atrial fibrillation with rapid ventricular response ([place])   . Acute blood loss anemia   . PVD (peripheral vascular disease) ([place])   . Tachypnea   . Acute ischemic stroke ([place]) 08/31/2017  . Lower extremity edema 04/21/2017  . Anxiety 03/22/2017  . Acute cholecystitis s/p lap cholecystectomy 03/05/2017 03/04/2017  . HOCM (hypertrophic obstructive cardiomyopathy) ([place]) 03/04/2017  . 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 Fatigue 01/27/2017  . Low vitamin B12 level 01/27/2017  . Chronic ulcer of right foot ([place]) 08/09/2016  . Syncope 07/24/2015  . 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 OSA on CPAP 10/26/2014  . Back pain 10/16/2014  . Left leg numbness 10/16/2014  . Episodic lightheadedness 08/04/2014  . Open toe wound 07/18/2014  . Diabetes mellitus with neurological manifestations, uncontrolled ([place]) 02/27/2014  . Seasonal and perennial allergic rhinitis 10/25/2013  . Hypertension associated with diabetes ([place]) 10/25/2013  . Hyperlipidemia associated with type 2 diabetes mellitus ([place]) 10/25/2013  . Morbid obesity ([place]) 10/25/2013  . CKD stage 3 due to type 2 diabetes mellitus ([place]) 10/25/2013   Past Medical History:  Past Medical History:  Diagnosis Date  . Anemia, iron deficiency   . Anxiety   . Arthritis   . BPH (benign prostatic hypertrophy)   . CAD (coronary artery disease)    Nonobstructive CAD per cath  . Cardiac  pacemaker in situ   . CHF (congestive heart failure) ([place])   . Chronic ulcer of right foot ([place])   . CKD (chronic kidney disease), stage III ([place]) secondary to DM and HTN   nephrologist-  [name]  . Dyspnea   . History of cellulitis    right great toe 10-25-2014  . History of skin cancer   . HOCM (hypertrophic obstructive cardiomyopathy) ([place])   . Hypertension   . Insulin dependent type 2 diabetes mellitus ([place]) 1991   followd by [name]--  has insulin pump  . Insulin pump in place   . OSA on CPAP   . Peripheral neuropathy    severe  . Peripheral vascular disease ([place])    bilateral lower extremities  . Rib fracture 07/24/2015  . Secondary hyperparathyroidism of renal origin ([place])   . Sinus node dysfunction ([facility])   . Sleep apnea    Past Surgical History:  Past Surgical History:  Procedure Laterality Date  . AMPUTATION OF REPLICATED TOES  Mar 3299   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)  . AMPUTATION TOE Right 03/12/2015   Procedure: RIGHT HALLUS AMPUTATION ;  Surgeon: [name], MD;  Location: [facility];  Service: Podiatry;  Laterality: Right;  . CARDIAC CATHETERIZATION  11-25-2010   [place], [place]   Nonobstructive CAD  . CARDIAC PACEMAKER PLACEMENT  Nov 2009   [employer]  . CHOLECYSTECTOMY N/A 03/05/2017   Procedure: LAPAROSCOPIC CHOLECYSTECTOMY WITH INTRAOPERATIVE CHOLANGIOGRAM;  Surgeon: [name], MD;  [facility];  Service: General;  Laterality: N/A;  . EP IMPLANTABLE DEVICE N/A 06/03/2015  Procedure: PPM Generator Changeout;  Surgeon: [name], MD;  Location: [facility];  Service: Cardiovascular;  Laterality: N/A;  . EXCISION BONE CYST Right 03/06/2015   Procedure: BONE BIOPSIES OF RIGHT FOOT;  Surgeon: [name], MD;  Location: [facility];  Service: Podiatry;  Laterality: Right;  . ORIF ANKLE FRACTURE Left 11/06/2014   Procedure: OPEN REDUCTION INTERNAL FIXATION (ORIF) LEFT  ANKLE FRACTURE;  Surgeon: [name],  MD;  Location: [facility];  Service: Orthopedics;  Laterality: Left;  . TOTAL KNEE ARTHROPLASTY    . VEIN LIGATION AND STRIPPING     HPI:  77 yo man with DM, HTN, CKD3, admitted with R MCA dist CVA. On 09/02/17 patient experienced dizziness and had a syncopal episode while returning from the bathroom, was resuscitated with 1 dose of atropine and 1 minute of chest compressions. MRI 08/31/17 revealed small areas of restricted diffusion in the right frontal cortex posteriorly compatible with acute infarct.   Assessment / Plan / Recommendation Clinical Impression   Patient presents with mild-moderate cognitive impairment, with deficits in selective attention, delayed recall, problem solving, organization and verbal sequencing. Slow processing and decreased error awareness noted during assessment. Inappropriate comment x1 upon SLP introduction; question pragmatics vs personality. Administered MOCA- Basic; pt scored 20/30 (>26 is WNL). Pt requires cues to ID errors in clock drawing, trailmaking; he has difficulties problem solving to correct his errors without assistance, however he does have awareness that thinking skills are different from baseline. Prior to admission, pt was living independently with wife and was driving per his report. He would benefit from CIR to maximize cognitive function, independence and decrease caregiver burden. Will follow acutely.    SLP Assessment  SLP Recommendation/Assessment: Patient needs continued Speech Lanaguage Pathology Services SLP Visit Diagnosis: Cognitive communication deficit (R41.841);Frontal lobe and executive function deficit;Attention and concentration deficit Attention and concentration deficit following: Cerebral infarction Frontal lobe and executive function deficit following: Cerebral infarction    Follow Up Recommendations  Inpatient Rehab    Frequency and Duration min 2x/week  2 weeks      SLP Evaluation Cognition  Overall  Cognitive Status: Impaired/Different from baseline Arousal/Alertness: Awake/alert Orientation Level: Oriented X4 Attention: Focused;Sustained;Selective Focused Attention: Appears intact Sustained Attention: Appears intact Selective Attention: Impaired Selective Attention Impairment: Functional basic;Verbal basic Memory: Impaired Memory Impairment: Storage deficit;Decreased recall of new information(delayed recall 2/5) Awareness: Impaired Awareness Impairment: Emergent impairment Problem Solving: Impaired Problem Solving Impairment: Functional complex Executive Function: Organizing;Sequencing Sequencing: Impaired Sequencing Impairment: Functional complex;Verbal complex Organizing: Impaired Organizing Impairment: Functional basic(clock drawing impaired) Safety/Judgment: Impaired       Comprehension  Auditory Comprehension Overall Auditory Comprehension: Appears within functional limits for tasks assessed Yes/No Questions: Within Functional Limits Commands: Within Functional Limits Conversation: (mod complex) Interfering Components: Attention;Processing speed Visual Recognition/Discrimination Discrimination: Not tested Reading Comprehension Reading Status: Not tested    Expression Expression Primary Mode of Expression: Verbal Verbal Expression Overall Verbal Expression: Appears within functional limits for tasks assessed Naming: Impairment Divergent: 75-100% accurate(7 fruits in 45 seconds, then began naming vegetables) Pragmatics: Impairment Impairments: Topic appropriateness Interfering Components: Attention Written Expression Dominant Hand: Right Written Expression: Not tested   Oral / Motor  Oral Motor/Sensory Function Overall Oral Motor/Sensory Function: Mild impairment Facial ROM: Reduced left;Suspected CN VII (facial) dysfunction Facial Symmetry: Abnormal symmetry left Facial Strength: Within Functional Limits Facial Sensation: Within Functional Limits Lingual  ROM: Within Functional Limits Lingual Symmetry: Within Functional Limits Lingual Strength: Within Functional Limits Velum: Within  Functional Limits Mandible: Within Functional Limits Motor Speech Overall Motor Speech: Appears within functional limits for tasks assessed Respiration: Within functional limits Articulation: Within functional limitis Intelligibility: Intelligible Motor Planning: Witnin functional limits Motor Speech Errors: Not applicable   [place], [place], [place] Speech-Language Pathologist [phone]  Aliene Altes 09/03/2017, 1:31 PM

## 2017-09-03 NOTE — Progress Notes (Signed)
7023: Pt had an 11-beat run of V-tach. Checked on pt that was asleep. Pt not symptomatic and states he feels fine. Paged on-call MD and advised.

## 2017-09-03 NOTE — Progress Notes (Signed)
Pulmonary/Critical Care Progress Note  Patient Name: David [name] MRN: [id number] DOB: 12-16-1940    ADMISSION DATE:  08/30/2017 CONSULTATION DATE:  09/02/2017  REFERRING MD: Dr. Kathie Dike  REASON FOR CONSULTATION: Post CPR resuscitation, hypotension   ASSESSMENT/PLAN:  ASSESSMENT (included in the Hospital Problem List)  Principal Problem:   Acute ischemic stroke [facility]) Active Problems:   Hypertension associated with diabetes ([place])   Hyperlipidemia associated with type 2 diabetes mellitus ([place])   CKD stage 3 due to type 2 diabetes mellitus ([place])   Diabetes mellitus with neurological manifestations, uncontrolled ([place])   OSA on CPAP   Pacemaker   HOCM (hypertrophic obstructive cardiomyopathy) ([place])   IDDM (insulin dependent diabetes mellitus) - on insulin pump   Agitation   Restless   Diastolic dysfunction   Coronary artery disease involving native coronary artery of native heart without angina pectoris   Cardiac pacemaker in situ   Stage 3 chronic kidney disease (HCC)   Atrial fibrillation with rapid ventricular response (HCC)   Acute blood loss anemia   PVD (peripheral vascular disease) (H[facility])   Tachypnea   PLAN/RECOMMENDATIONS  Clinically, this patient's experience is suspicious for orthostatic hypotension.    Has responded favorably to IV fluid administration.  Now hypertensive.     Stop IV fluids.    Avoid excessive diuresis  Check magnesium.  I will defer antihypertensive management to the attending physician.  I suggest beginning with reintroduction of carvedilol (or substitution with a cardioselective beta-blocker).  DISPOSITION: At the discretion of the admitting physician.  Will see as needed. Please call if needed.     SUBJECTIVE:   -Interim events: Had 11-beat run of nonsustained ventricular tachycardia this morning.  Has successfully been back and forth to the bathroom twice in the interim.  No dizziness.  No presyncope or syncope.  Now  hypertensive.  HISTORY OF PRESENT ILLNESS: This 77 y.o. obese Caucasian male is seen in consultation at the request of Dr. Kathie Dike for recommendations on further evaluation and management of hypotension and post CPR surveillance.  The specific request to put to the service is a request to transfer the patient to ICU level of care for monitoring.  He is currently in a stepdown unit, where he is undergoing continuous cardiac monitoring, frequent blood pressure checks and continuous oximetry.  The patient was originally admitted on 08/30/2017 with concerns for stroke.  Indeed, the patient was diagnosed with an ischemic right frontal MCA stroke, which has been medically managed along with physical therapy.  The patient was approaching disposition to inpatient rehabilitation.  However, earlier this morning, the patient experienced dizziness and had a syncopal episode while returning from the bathroom.  Apparently, he became pulseless while moving from the bathroom back to the bed.  Based on discussion with nurse and review of available documentation, the patient was resuscitated with 1 dose of atropine and 1 minute of chest compressions.  At the time of clinical interview, the patient is awake, alert and oriented to time person and place.  He is moving all 4 extremities.  He is a little slow to respond (particularly when trying to recall the year and POTUS).  However, he is currently normotensive and on normal saline infusion at 50 mL/h.  He is not requiring vasopressor administration.  He is on room air with an SPO2 of 99-100%.  Of note, the patient has hypertrophic obstructive cardiomyopathy.  He has been n.p.o. subsequent to his stroke and was also undergoing diuretic therapy due to  lower extremity edema and perceived orthopnea leading up to this morning's episode.  PAST MEDICAL/SURGICAL HISTORIES  Past Medical History:  Diagnosis Date  . Anemia, iron deficiency   . Anxiety   . Arthritis   . BPH  (benign prostatic hypertrophy)   . CAD (coronary artery disease)    Nonobstructive CAD per cath  . Cardiac pacemaker in situ   . CHF (congestive heart failure) ([place])   . Chronic ulcer of right foot ([place])   . CKD (chronic kidney disease), stage III ([place]) secondary to DM and HTN   nephrologist-  [name]  . Dyspnea   . History of cellulitis    right great toe 10-25-2014  . History of skin cancer   . HOCM (hypertrophic obstructive cardiomyopathy) ([place])   . Hypertension   . Insulin dependent type 2 diabetes mellitus ([place]) 1991   followd by [name]--  has insulin pump  . Insulin pump in place   . OSA on CPAP   . Peripheral neuropathy    severe  . Peripheral vascular disease ([place])    bilateral lower extremities  . Rib fracture 07/24/2015  . Secondary hyperparathyroidism of renal origin ([place])   . Sinus node dysfunction (HCC)   . Sleep apnea     Past Surgical History:  Procedure Laterality Date  . AMPUTATION OF REPLICATED TOES  Mar 8341   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)  . AMPUTATION TOE Right 03/12/2015   Procedure: RIGHT HALLUS AMPUTATION ;  Surgeon: [name], MD;  Location: [facility];  Service: Podiatry;  Laterality: Right;  . CARDIAC CATHETERIZATION  11-25-2010   [place], [place]   Nonobstructive CAD  . CARDIAC PACEMAKER PLACEMENT  Nov 2009   [employer]  . CHOLECYSTECTOMY N/A 03/05/2017   Procedure: LAPAROSCOPIC CHOLECYSTECTOMY WITH INTRAOPERATIVE CHOLANGIOGRAM;  Surgeon: [name], MD;  [facility];  Service: General;  Laterality: N/A;  . EP IMPLANTABLE DEVICE N/A 06/03/2015   Procedure: PPM Generator Changeout;  Surgeon: [name], MD;  Location: [facility];  Service: Cardiovascular;  Laterality: N/A;  . EXCISION BONE CYST Right 03/06/2015   Procedure: BONE BIOPSIES OF RIGHT FOOT;  Surgeon: [name], MD;  Location: [facility];  Service: Podiatry;  Laterality: Right;  . ORIF ANKLE FRACTURE Left 11/06/2014   Procedure:  OPEN REDUCTION INTERNAL FIXATION (ORIF) LEFT  ANKLE FRACTURE;  Surgeon: [name], MD;  Location: [facility];  Service: Orthopedics;  Laterality: Left;  . TOTAL KNEE ARTHROPLASTY    . VEIN LIGATION AND STRIPPING       Prior to Admission medications   Medication Sig Start Date End Date Taking? Authorizing Provider  amLODipine (NORVASC) 10 MG tablet Take 1 tablet (10 mg total) by mouth daily. 04/28/17  Yes [name], MD  aspirin EC 81 MG tablet Take 81 mg by mouth daily.   Yes [provider]  calcitRIOL (ROCALTROL) 0.25 MCG capsule Take 0.25 mcg by mouth daily.  05/30/14  Yes [provider]  carvedilol (COREG) 25 MG tablet Take 1 tablet (25 mg total) by mouth 2 (two) times daily with a meal. 08/15/17  Yes [name], MD  cholecalciferol (VITAMIN D) 1000 units tablet Take 1 tablet (1,000 Units total) by mouth daily. Patient taking differently: Take 1,000 Units by mouth every evening.  02/01/17  Yes [name], MD  doxazosin (CARDURA) 4 MG tablet TAKE 1 TABLET BY MOUTH ONCE DAILY 07/23/17  Yes Elayne Snare, MD  hydrOXYzine (ATARAX/VISTARIL)  50 MG tablet Take 1 tablet (50 mg total) 3 (three) times daily as needed by mouth. 03/22/17  Yes [name], MD  Icosapent Ethyl (VASCEPA) 1 g CAPS 2 caps bid 02/22/17  Yes Elayne Snare, MD  insulin regular human CONCENTRATED (HUMULIN R) 500 UNIT/ML injection USE UP TO 0.5 ML IN INSULIN PUMP daily. 04/26/17  Yes Elayne Snare, MD  isosorbide mononitrate (IMDUR) 30 MG 24 hr tablet TAKE TWO TABLETS BY MOUTH ONCE DAILY 11/07/16  Yes [name], MD  liraglutide (VICTOZA) 18 MG/3ML SOPN INJECT 0.3MLS (1.8 MG TOTAL) INTO THE SKIN DAILY 05/23/16  Yes Elayne Snare, MD  lisinopril (PRINIVIL,ZESTRIL) 5 MG tablet Take 5 mg by mouth every evening.  07/15/15  Yes [provider]  Omega-3 Fatty Acids (FISH OIL) 1000 MG CAPS Take by mouth 2 (two) times daily.   Yes [provider]  pantoprazole (PROTONIX) 40 MG  tablet TAKE ONE TABLET BY MOUTH ONCE DAILY 02/07/17  Yes [name], FNP  sertraline (ZOLOFT) 100 MG tablet Take 100 mg by mouth daily.   Yes [provider]  torsemide (DEMADEX) 20 MG tablet Take 20 mg by mouth 2 (two) times daily.    Yes [provider]  traMADol (ULTRAM) 50 MG tablet Take 1-2 tablets (50-100 mg total) by mouth every 6 (six) hours as needed for moderate pain or severe pain. 03/05/17  Yes [name], MD  vitamin B-12 (CYANOCOBALAMIN) 100 MCG tablet Take 100 mcg by mouth daily.   Yes [provider]  insulin regular human CONCENTRATED (HUMULIN R) 500 UNIT/ML injection USE 0.25 ML DAILY OR AS DIRECTED IN INSULIN PUMP E11.65 Patient not taking: Reported on 08/30/2017 06/06/17   Elayne Snare, MD    Allergies  Allergen Reactions  . Adhesive [Tape]     blisters    Current Facility-Administered Medications:  .  0.9 %  sodium chloride infusion, , Intravenous, Continuous, Byrum, Rose Fillers, MD, Last Rate: 50 mL/hr at 09/02/17 1615 .  acetaminophen (TYLENOL) tablet 650 mg, 650 mg, Oral, Q4H PRN **OR** acetaminophen (TYLENOL) solution 650 mg, 650 mg, Per Tube, Q4H PRN **OR** acetaminophen (TYLENOL) suppository 650 mg, 650 mg, Rectal, Q4H PRN, Kathie Dike, MD .  aspirin suppository 300 mg, 300 mg, Rectal, Daily **OR** aspirin tablet 325 mg, 325 mg, Oral, Daily, Memon, Jolaine Artist, MD, 325 mg at 09/03/17 0908 .  atorvastatin (LIPITOR) tablet 10 mg, 10 mg, Oral, q1800, Kathie Dike, MD, 10 mg at 09/02/17 1733 .  calcitRIOL (ROCALTROL) capsule 0.25 mcg, 0.25 mcg, Oral, Daily, Memon, Jolaine Artist, MD, 0.25 mcg at 09/03/17 0908 .  cholecalciferol (VITAMIN D) tablet 1,000 Units, 1,000 Units, Oral, QPM, Kathie Dike, MD, 1,000 Units at 09/02/17 1733 .  enoxaparin (LOVENOX) injection 40 mg, 40 mg, Subcutaneous, Q24H, Memon, Jehanzeb, MD, 40 mg at 09/02/17 1734 .  hydrOXYzine (ATARAX/VISTARIL) tablet 50 mg, 50 mg, Oral, TID PRN, Kathie Dike, MD, 50 mg at  09/02/17 2226 .  insulin aspart (novoLOG) injection 0-15 Units, 0-15 Units, Subcutaneous, TID WC, Kathie Dike, MD, 5 Units at 09/03/17 0907 .  insulin aspart (novoLOG) injection 4 Units, 4 Units, Subcutaneous, TID WC, Kathie Dike, MD, 4 Units at 09/03/17 0907 .  insulin glargine (LANTUS) injection 15 Units, 15 Units, Subcutaneous, BID, Kathie Dike, MD, 15 Units at 09/03/17 0907 .  pantoprazole (PROTONIX) EC tablet 40 mg, 40 mg, Oral, Daily, Memon, Jolaine Artist, MD, 40 mg at 09/03/17 0908 .  senna-docusate (Senokot-S) tablet 1 tablet, 1 tablet, Oral, QHS PRN, Kathie Dike,  MD .  sertraline (ZOLOFT) tablet 100 mg, 100 mg, Oral, Daily, Memon, Jolaine Artist, MD, 100 mg at 09/03/17 0908 .  traMADol (ULTRAM) tablet 50-100 mg, 50-100 mg, Oral, Q6H PRN, Kathie Dike, MD .  vitamin B-12 (CYANOCOBALAMIN) tablet 100 mcg, 100 mcg, Oral, Daily, Kathie Dike, MD, 100 mcg at 09/03/17 0908   OBJECTIVE:   VITAL SIGNS: BP (!) 162/62 (BP Location: Right Arm)   Pulse (!) 59   Temp (!) 97.5 F (36.4 C) (Oral)   Resp 16   Ht 6\' 4"  (1.93 m)   Wt 130.2 kg (287 lb)   SpO2 95%   BMI 34.93 kg/m  Vitals:   09/03/17 0000 09/03/17 0300 09/03/17 0400 09/03/17 0800  BP: (!) 147/58  (!) 153/56 (!) 162/62  Pulse: (!) 58  (!) 59 (!) 59  Resp: 15  16 16   Temp:  98.9 F (37.2 C)  (!) 97.5 F (36.4 C)  TempSrc:  Oral  Oral  SpO2: 97%  96% 95%  Weight:      Height:       INTAKE / OUTPUT: I/O last 3 completed shifts: In: 2323.8 [P.O.:1680; I.V.:643.8] Out: 1300 [Urine:1300]  PHYSICAL EXAMINATION: GENERAL: Pleasant. Well-developed. Alert, awake and oriented to time, person and place. Cooperative. No acute distress. HEAD: normocephalic, atraumatic EYE: PERRLA, EOM intact, no scleral icterus, no pallor. NOSE: nares are patent. No polyps. No exudate. No sinus tenderness. THROAT/ORAL CAVITY: Normal dentition. No oral thrush. No exudate. Mucous membranes are moist. No tonsillar enlargement. Mallampati  class III (soft and hard palate and base of uvula visible) airway. NECK: supple, no thyromegaly, no JVD, no lymphadenopathy. Trachea midline. CHEST/LUNG: symmetric in development and expansion. Good air entry. no crackles. No wheezes. HEART: Regular S1 and S2 without murmur, rub or gallop. ABDOMEN: soft, nontender, nondistended. Normoactive bowel sounds. No rebound. No guarding. No hepatosplenomegaly. EXTREMITIES: Edema: none. No cyanosis.  No clubbing. 2+ DP pulses LYMPHATIC: no cervical/axiallary/inguinal lymph nodes appreciated MUSCULOSKELETAL: no joint tenderness, deformity or swelling. SKIN: No rash or lesion. NEUROLOGIC: Doll's eyes intact. Corneal reflex intact. Spontaneous respirations intact. Cranial nerves II-XII are grossly symmetric and physiologic. Babinski absent. No sensory deficit. Motor: 5/5 @ RUE, 5/5 @ LUE, 5/5 @ RLL,  5/5 @ LLL.  DTR: 2+ @ R biceps, 2+ @ L biceps, 2+ @ R patellar,  2+ @ L patellar. No cerebellar signs. Gait was not assessed.   LABS:  SERUM CHEMISTRY Recent Labs  Lab 08/30/17 2057 09/02/17 1209 09/02/17 1423 09/03/17 0030  NA 139 136  --  138  K 4.7 4.7  --  4.2  CL 108 104  --  106  CO2 23 20*  --  22  BUN 33* 51*  --  52*  CREATININE 1.95* 2.41*  --  2.39*  GLUCOSE 136* 248*  --  147*  CALCIUM 9.5 8.5*  --  8.4*  MG  --   --  2.1  --     Glucose Recent Labs  Lab 09/01/17 2111 09/02/17 0831 09/02/17 1124 09/02/17 1726 09/02/17 2156 09/03/17 0834  GLUCAP 168* 182* 242* 202* 152* 207*    Liver Enzymes No results for input(s): AST, ALT, ALKPHOS, BILITOT, ALBUMIN in the last 168 hours.  CBC Recent Labs  Lab 08/30/17 2057 09/02/17 1209 09/03/17 0030  WBC 8.6 7.1 7.2  HGB 11.5* 11.2* 11.0*  HCT 34.8* 33.6* 33.9*  PLT 190 159 170    COAGULATION STUDIES No results for input(s): APTT, INR in the last 168 hours.  Sepsis Markers No results for input(s): LATICACIDVEN, PROCALCITON, O2SATVEN in the last 168 hours.  ABG Recent  Labs  Lab 09/02/17 1250  PHART 7.377  PCO2ART 42.9  PO2ART 65.5*    Cardiac Enzymes Recent Labs  Lab 09/02/17 1209 09/02/17 1734 09/03/17 0030  TROPONINI 0.04* 0.07* 0.04*    CULTURES: Results for orders placed or performed during the hospital encounter of 08/30/17  MRSA PCR Screening     Status: None   Collection Time: 09/02/17  6:35 AM  Result Value Ref Range Status   MRSA by PCR NEGATIVE NEGATIVE Final    Comment:        The GeneXpert MRSA Assay (FDA approved for NASAL specimens only), is one component of a comprehensive MRSA colonization surveillance program. It is not intended to diagnose MRSA infection nor to guide or monitor treatment for MRSA infections. Performed at [facility], [place] [address], [place], [place] [address]     IMAGING: Dg Chest Port 1 View  Result Date: 09/02/2017 CLINICAL DATA:  Shortness of breath for 1 day.  Syncopal episode. EXAM: PORTABLE CHEST 1 VIEW COMPARISON:  CT chest 07/23/2015. PA and lateral chest 08/31/2017 and 06/29/2017. FINDINGS: Pacing device is unchanged. Heart size is upper normal. Aortic atherosclerosis is seen. The lungs appear hyperexpanded but are clear. Prominent epicardial fat at the left cardiophrenic angle noted. No acute bony abnormality. IMPRESSION: No acute disease. Atherosclerosis. Pulmonary hyperexpansion suggestive of emphysema. Electronically Signed   By: Inge Rise [name]   On: 09/02/2017 14:35    My assessment, plan of care, findings, medications, side effects, etc. were discussed with:  Nurse    FAMILY  - Updates: patient and wife.   Renee Pain, MD Board Certified by the ABIM, [place] Pager: [phone]  09/03/2017, 10:15 AM

## 2017-09-03 NOTE — Progress Notes (Signed)
PROGRESS NOTE    David [name]  LPF:[id number] DOB: [birth year] DOA: 08/30/2017 PCP: [name], MD    Brief Narrative:  77 year old male with a history of diabetes on insulin pump, hypertension, hyperlipidemia, chronic kidney disease stage III, presented with 2-day history of left hand weakness along with speech impairment and left-sided facial droop.  Found to have embolic right MCA distribution infarct.  Seen by stroke service and recommendations for dual antiplatelet therapy.  Seen by physical therapy recommended CIR.  On 4/20 patient was using the restroom and became lethargic and hypotensive with blood pressure in the 60-70s. He became unresponsive and staff could not palpate a pulse. He received one dose of atropine and 1 min of chest compressions. He did not require intubation. Suspect hypotension/orthostatic hypotension related to volume depletion. Started on IV fluids. Critical care consulted. EKG showed no changes.  Improved.  CCM signed off 4/21.   Assessment & Plan:   Principal Problem:   Acute ischemic stroke [facility]) Active Problems:   Hypertension associated with diabetes ([place])   Hyperlipidemia associated with type 2 diabetes mellitus ([place])   CKD stage 3 due to type 2 diabetes mellitus ([place])   Diabetes mellitus with neurological manifestations, uncontrolled (HCC)   OSA on CPAP   Pacemaker   HOCM (hypertrophic obstructive cardiomyopathy) (HCC)   IDDM (insulin dependent diabetes mellitus) - on insulin pump   Agitation   Restless   Diastolic dysfunction   Coronary artery disease involving native coronary artery of native heart without angina pectoris   Cardiac pacemaker in situ   Stage 3 chronic kidney disease (HCC)   Atrial fibrillation with rapid ventricular response (HCC)   Acute blood loss anemia   PVD (peripheral vascular disease) (HCC)   Tachypnea   1. Acute ischemic stroke.  Patient found to have right embolic right frontal MCA branch infarct, etiology  undetermined.  Transesophageal echocardiogram did not show any cardiac source of embolism.  Recommendations per neurology are for dual antiplatelet therapy of aspirin and Plavix for 3 weeks followed by Plavix alone.  He was seen by physical therapy and occupational therapy who recommended CIR placement.  Currently he is being evaluated by rehab team and is a potential to go to CIR.  LDL above goal at 106 and he is been started on Lipitor.  A1c is 7.6.  Per cardiology notes, pacemaker was interrogated and did not show any new atrial fibrillation.  Stable and await rehab input (not clear on the weekend) 2. Hyperlipidemia.  LDL of 106.  Started on Lipitor.  LDL goal <70. 3. Chronic kidney disease stage III due to diabetes.  Renal function is stable.  Creatinine is near baseline. 4. Type II DM: A1c of 7.6.  Currently on sliding scale & Lantus.  On insulin pump at home.  CBGs mildly uncontrolled and fluctuating.  No change in current regimen. 5. Essential hypertension.  On multiple medications including amlodipine, lisinopril, Imdur, Coreg, torsemide.  After syncopal event associated with hypotension and brief cardiac arrest, all of these were held.  Briefly on IV fluids, now discontinued.  Blood pressures starting to rise.  11 beat NSVT noted on telemetry this morning.  Discussed with Cardiologist on call 4/21 and recommended starting carvedilol at reduced dose of 12.5 mg twice daily and titrate up as tolerated. 6. Obstructive sleep apnea.  Continue on CPAP 7. Obesity.  BMI 34.9.  Counseled on the importance of diet and exercise. 8. Hypertrophic cardiomyopathy status post pacemaker.  Avoid hypotension or over  diuresis.  Resumed carvedilol at reduced dose. 9. Syncope. Patient had a syncopal event 4/20 with associated hypotension. Blood pressure noted to be in the 60s-70s. Staff could not palpate pulse and he received chest compressions/atropine for 1 min. Suspect that he became hypotensive due to volume depletion  leading to syncope. EKG done and clear pacer spikes noted. No significant change from prior EKGs.  Telemetry did not show any arrhythmias coinciding with the event.  Minimally elevated troponin with flat trend likely insignificant and from demand ischemia/chronic kidney disease. All antihypertensives were held.  Briefly placed on IV fluids.  Transferred to stepdown.  CCM was consulted.  Clinically improved.  IV fluids discontinued. Avoid over diuresis or hypotension. 10. NSVT: Noted 11 beat NSVT on telemetry on 09/03/17 at 5:54 AM.  Asymptomatic.  Recent TTE and TEE with normal EF.  Potassium 4.2 and magnesium 2.  Discussed with Cardiologist on call 4/21 and will resume Carvedilol as indicated above. 11. Normocytic anemia: Unclear etiology.?  Chronic disease.  Stable.  DVT prophylaxis: Lovenox Code Status: Full code Family Communication: None at bedside. Disposition Plan: Pending possible discharge to CIR, awaiting input which will possibly happen 4/22.   Consultants:   Neurology  Cardiology  CCM  Procedures:   Transesophageal echocardiogram.  No source of cardiac embolus or PFO  Antimicrobials:       Subjective: Patient denies complaints.  Reports that he is ambulated to the bathroom couple times without dizziness, lightheadedness, chest pain, dyspnea or palpitations.  As per RN, no acute issues noted.  Objective: Vitals:   09/03/17 0400 09/03/17 0800 09/03/17 1000 09/03/17 1136  BP: (!) 153/56 (!) 162/62 (!) 148/59   Pulse: (!) 59 (!) 59 (!) 59   Resp: 16 16 18    Temp:  (!) 97.5 F (36.4 C)  98 F (36.7 C)  TempSrc:  Oral  Oral  SpO2: 96% 95% 91%   Weight:      Height:        Intake/Output Summary (Last 24 hours) at 09/03/2017 1140 Last data filed at 09/03/2017 1130 Gross per 24 hour  Intake 1483.75 ml  Output 975 ml  Net 508.75 ml   Filed Weights   08/30/17 2108  Weight: 130.2 kg (287 lb)    Examination:  General exam: Pleasant elderly male, moderately built  and obese lying comfortably propped up in bed.  Oral mucosa moist. Respiratory system: Clear to auscultation. Respiratory effort normal.  Stable. Cardiovascular system: S1 and S2 heard, RRR.  No JVD, murmurs or pedal edema.  Telemetry personally reviewed: A paced rhythm.  11 beat NSVT noted on telemetry 4/21 at 5:54 AM. Gastrointestinal system: Abdomen is nondistended, soft and nontender. No organomegaly or masses felt. Normal bowel sounds heard.  Stable. Central nervous system: Alert and oriented x4.  No focal neurological deficits. Extremities: No C/C/E, +pedal pulses.  Symmetric 5 x 5 power. Skin: No rashes, lesions or ulcers Psychiatry: Judgment and insight intact.  Affect pleasant and appropriate.  Data Reviewed: I have personally reviewed following labs and imaging studies  CBC: Recent Labs  Lab 08/30/17 2057 09/02/17 1209 09/03/17 0030  WBC 8.6 7.1 7.2  NEUTROABS 6.0  --   --   HGB 11.5* 11.2* 11.0*  HCT 34.8* 33.6* 33.9*  MCV 91.1 92.1 91.1  PLT 190 159 470   Basic Metabolic Panel: Recent Labs  Lab 08/30/17 2057 09/02/17 1209 09/02/17 1423 09/03/17 0030  NA 139 136  --  138  K 4.7 4.7  --  4.2  CL 108 104  --  106  CO2 23 20*  --  22  GLUCOSE 136* 248*  --  147*  BUN 33* 51*  --  52*  CREATININE 1.95* 2.41*  --  2.39*  CALCIUM 9.5 8.5*  --  8.4*  MG  --   --  2.1 2.0   GFR: Estimated Creatinine Clearance: 38.1 mL/min (A) (by C-G formula based on SCr of 2.39 mg/dL (H)).  Cardiac Enzymes: Recent Labs  Lab 09/02/17 1209 09/02/17 1734 09/03/17 0030  TROPONINI 0.04* 0.07* 0.04*   CBG: Recent Labs  Lab 09/02/17 0831 09/02/17 1124 09/02/17 1726 09/02/17 2156 09/03/17 0834  GLUCAP 182* 242* 202* 152* 207*     Recent Results (from the past 240 hour(s))  MRSA PCR Screening     Status: None   Collection Time: 09/02/17  6:35 AM  Result Value Ref Range Status   MRSA by PCR NEGATIVE NEGATIVE Final    Comment:        The GeneXpert MRSA Assay  (FDA approved for NASAL specimens only), is one component of a comprehensive MRSA colonization surveillance program. It is not intended to diagnose MRSA infection nor to guide or monitor treatment for MRSA infections. Performed at [facility], [place] [address], [place], [place] [address]          Radiology Studies: Dg Chest Port 1 View  Result Date: 09/02/2017 CLINICAL DATA:  Shortness of breath for 1 day.  Syncopal episode. EXAM: PORTABLE CHEST 1 VIEW COMPARISON:  CT chest 07/23/2015. PA and lateral chest 08/31/2017 and 06/29/2017. FINDINGS: Pacing device is unchanged. Heart size is upper normal. Aortic atherosclerosis is seen. The lungs appear hyperexpanded but are clear. Prominent epicardial fat at the left cardiophrenic angle noted. No acute bony abnormality. IMPRESSION: No acute disease. Atherosclerosis. Pulmonary hyperexpansion suggestive of emphysema. Electronically Signed   By: Inge Rise [name]   On: 09/02/2017 14:35        Scheduled Meds: . aspirin  300 mg Rectal Daily   Or  . aspirin  325 mg Oral Daily  . atorvastatin  10 mg Oral q1800  . calcitRIOL  0.25 mcg Oral Daily  . carvedilol  12.5 mg Oral BID WC  . cholecalciferol  1,000 Units Oral QPM  . enoxaparin (LOVENOX) injection  40 mg Subcutaneous Q24H  . insulin aspart  0-15 Units Subcutaneous TID WC  . insulin aspart  4 Units Subcutaneous TID WC  . insulin glargine  15 Units Subcutaneous BID  . pantoprazole  40 mg Oral Daily  . sertraline  100 mg Oral Daily  . vitamin B-12  100 mcg Oral Daily   Continuous Infusions: . sodium chloride 50 mL/hr at 09/02/17 1615     LOS: 3 days    Vernell Leep, MD, FACP, [facility]. [facility] Pager [phone]  If 7PM-7AM, please contact night-coverage [email or web address] Password [facility] 09/03/2017, 11:54 AM

## 2017-09-04 ENCOUNTER — Inpatient Hospital Stay (HOSPITAL_COMMUNITY): Payer: Medicare Other

## 2017-09-04 ENCOUNTER — Inpatient Hospital Stay (HOSPITAL_COMMUNITY)
Admission: RE | Admit: 2017-09-04 | Discharge: 2017-09-12 | DRG: 092 | Disposition: A | Discharge status: Home / Self Care | Payer: Medicare Other | Source: Intra-hospital | Attending: Physical Medicine & Rehabilitation | Admitting: Physical Medicine & Rehabilitation

## 2017-09-04 ENCOUNTER — Encounter (HOSPITAL_COMMUNITY): Payer: Self-pay | Admitting: *Deleted

## 2017-09-04 DIAGNOSIS — E1165 Type 2 diabetes mellitus with hyperglycemia: Secondary | ICD-10-CM | POA: Diagnosis not present

## 2017-09-04 DIAGNOSIS — L97519 Non-pressure chronic ulcer of other part of right foot with unspecified severity: Secondary | ICD-10-CM | POA: Diagnosis present

## 2017-09-04 DIAGNOSIS — E1169 Type 2 diabetes mellitus with other specified complication: Secondary | ICD-10-CM | POA: Diagnosis present

## 2017-09-04 DIAGNOSIS — R4189 Other symptoms and signs involving cognitive functions and awareness: Secondary | ICD-10-CM | POA: Diagnosis not present

## 2017-09-04 DIAGNOSIS — I421 Obstructive hypertrophic cardiomyopathy: Secondary | ICD-10-CM | POA: Diagnosis present

## 2017-09-04 DIAGNOSIS — E1142 Type 2 diabetes mellitus with diabetic polyneuropathy: Secondary | ICD-10-CM | POA: Diagnosis present

## 2017-09-04 DIAGNOSIS — Z79891 Long term (current) use of opiate analgesic: Secondary | ICD-10-CM

## 2017-09-04 DIAGNOSIS — E1122 Type 2 diabetes mellitus with diabetic chronic kidney disease: Secondary | ICD-10-CM | POA: Diagnosis present

## 2017-09-04 DIAGNOSIS — I130 Hypertensive heart and chronic kidney disease with heart failure and stage 1 through stage 4 chronic kidney disease, or unspecified chronic kidney disease: Secondary | ICD-10-CM | POA: Diagnosis present

## 2017-09-04 DIAGNOSIS — IMO0002 Reserved for concepts with insufficient information to code with codable children: Secondary | ICD-10-CM | POA: Diagnosis present

## 2017-09-04 DIAGNOSIS — E1151 Type 2 diabetes mellitus with diabetic peripheral angiopathy without gangrene: Secondary | ICD-10-CM | POA: Diagnosis present

## 2017-09-04 DIAGNOSIS — Z87891 Personal history of nicotine dependence: Secondary | ICD-10-CM

## 2017-09-04 DIAGNOSIS — I5032 Chronic diastolic (congestive) heart failure: Secondary | ICD-10-CM | POA: Diagnosis not present

## 2017-09-04 DIAGNOSIS — R269 Unspecified abnormalities of gait and mobility: Secondary | ICD-10-CM | POA: Diagnosis not present

## 2017-09-04 DIAGNOSIS — N2581 Secondary hyperparathyroidism of renal origin: Secondary | ICD-10-CM | POA: Diagnosis present

## 2017-09-04 DIAGNOSIS — N183 Chronic kidney disease, stage 3 (moderate): Secondary | ICD-10-CM | POA: Diagnosis present

## 2017-09-04 DIAGNOSIS — M1712 Unilateral primary osteoarthritis, left knee: Secondary | ICD-10-CM | POA: Diagnosis present

## 2017-09-04 DIAGNOSIS — I69354 Hemiplegia and hemiparesis following cerebral infarction affecting left non-dominant side: Secondary | ICD-10-CM

## 2017-09-04 DIAGNOSIS — F419 Anxiety disorder, unspecified: Secondary | ICD-10-CM | POA: Diagnosis present

## 2017-09-04 DIAGNOSIS — Z9049 Acquired absence of other specified parts of digestive tract: Secondary | ICD-10-CM

## 2017-09-04 DIAGNOSIS — I10 Essential (primary) hypertension: Secondary | ICD-10-CM

## 2017-09-04 DIAGNOSIS — E1149 Type 2 diabetes mellitus with other diabetic neurological complication: Secondary | ICD-10-CM | POA: Diagnosis present

## 2017-09-04 DIAGNOSIS — I152 Hypertension secondary to endocrine disorders: Secondary | ICD-10-CM | POA: Diagnosis present

## 2017-09-04 DIAGNOSIS — I2510 Atherosclerotic heart disease of native coronary artery without angina pectoris: Secondary | ICD-10-CM | POA: Diagnosis present

## 2017-09-04 DIAGNOSIS — I639 Cerebral infarction, unspecified: Secondary | ICD-10-CM | POA: Diagnosis not present

## 2017-09-04 DIAGNOSIS — G4733 Obstructive sleep apnea (adult) (pediatric): Secondary | ICD-10-CM | POA: Diagnosis present

## 2017-09-04 DIAGNOSIS — N400 Enlarged prostate without lower urinary tract symptoms: Secondary | ICD-10-CM | POA: Diagnosis present

## 2017-09-04 DIAGNOSIS — Z7982 Long term (current) use of aspirin: Secondary | ICD-10-CM

## 2017-09-04 DIAGNOSIS — Z9641 Presence of insulin pump (external) (internal): Secondary | ICD-10-CM | POA: Diagnosis not present

## 2017-09-04 DIAGNOSIS — Z89411 Acquired absence of right great toe: Secondary | ICD-10-CM

## 2017-09-04 DIAGNOSIS — E118 Type 2 diabetes mellitus with unspecified complications: Secondary | ICD-10-CM | POA: Diagnosis not present

## 2017-09-04 DIAGNOSIS — Z89421 Acquired absence of other right toe(s): Secondary | ICD-10-CM

## 2017-09-04 DIAGNOSIS — R2689 Other abnormalities of gait and mobility: Secondary | ICD-10-CM | POA: Diagnosis present

## 2017-09-04 DIAGNOSIS — Z6834 Body mass index (BMI) 34.0-34.9, adult: Secondary | ICD-10-CM

## 2017-09-04 DIAGNOSIS — Z96659 Presence of unspecified artificial knee joint: Secondary | ICD-10-CM | POA: Diagnosis present

## 2017-09-04 DIAGNOSIS — R55 Syncope and collapse: Secondary | ICD-10-CM | POA: Diagnosis present

## 2017-09-04 DIAGNOSIS — Z79899 Other long term (current) drug therapy: Secondary | ICD-10-CM

## 2017-09-04 DIAGNOSIS — I472 Ventricular tachycardia: Secondary | ICD-10-CM | POA: Diagnosis present

## 2017-09-04 DIAGNOSIS — I69398 Other sequelae of cerebral infarction: Secondary | ICD-10-CM | POA: Diagnosis not present

## 2017-09-04 DIAGNOSIS — Z794 Long term (current) use of insulin: Secondary | ICD-10-CM

## 2017-09-04 DIAGNOSIS — Z85828 Personal history of other malignant neoplasm of skin: Secondary | ICD-10-CM

## 2017-09-04 DIAGNOSIS — I5022 Chronic systolic (congestive) heart failure: Secondary | ICD-10-CM | POA: Diagnosis not present

## 2017-09-04 DIAGNOSIS — R52 Pain, unspecified: Secondary | ICD-10-CM

## 2017-09-04 DIAGNOSIS — Z950 Presence of cardiac pacemaker: Secondary | ICD-10-CM

## 2017-09-04 DIAGNOSIS — E1159 Type 2 diabetes mellitus with other circulatory complications: Secondary | ICD-10-CM | POA: Diagnosis present

## 2017-09-04 DIAGNOSIS — Z91048 Other nonmedicinal substance allergy status: Secondary | ICD-10-CM

## 2017-09-04 HISTORY — DX: Cerebral infarction, unspecified: I63.9

## 2017-09-04 LAB — GLUCOSE, CAPILLARY
Glucose-Capillary: 134 mg/dL — ABNORMAL HIGH (ref 65–99)
Glucose-Capillary: 199 mg/dL — ABNORMAL HIGH (ref 65–99)
Glucose-Capillary: 198 mg/dL — ABNORMAL HIGH (ref 65–99)
Glucose-Capillary: 216 mg/dL — ABNORMAL HIGH (ref 65–99)
Glucose-Capillary: 274 mg/dL — ABNORMAL HIGH (ref 65–99)

## 2017-09-04 MED ORDER — ACETAMINOPHEN 325 MG PO TABS: 325.0000 mg | ORAL_TABLET | ORAL | Status: DC | PRN

## 2017-09-04 MED ORDER — GUAIFENESIN-DM 100-10 MG/5ML PO SYRP: 5.0000 mL | ORAL_SOLUTION | Freq: Four times a day (QID) | ORAL | Status: DC | PRN

## 2017-09-04 MED ORDER — PANTOPRAZOLE SODIUM 40 MG PO TBEC
40.0000 mg | DELAYED_RELEASE_TABLET | Freq: Every day | ORAL | Status: DC
  Administered 2017-09-05 – 2017-09-12 (×8): 40 mg via ORAL
  Filled 2017-09-04 (×8): qty 1

## 2017-09-04 MED ORDER — PROCHLORPERAZINE MALEATE 5 MG PO TABS: 5.0000 mg | ORAL_TABLET | Freq: Four times a day (QID) | ORAL | Status: DC | PRN

## 2017-09-04 MED ORDER — INSULIN GLARGINE 100 UNIT/ML ~~LOC~~ SOLN
15.0000 [IU] | Freq: Two times a day (BID) | SUBCUTANEOUS | Status: DC
  Administered 2017-09-04 – 2017-09-06 (×4): 15 [IU] via SUBCUTANEOUS
  Filled 2017-09-04 (×4): qty 0.15

## 2017-09-04 MED ORDER — ISOSORBIDE MONONITRATE ER 30 MG PO TB24
30.0000 mg | ORAL_TABLET | Freq: Every day | ORAL | Status: DC
  Administered 2017-09-05 – 2017-09-12 (×8): 30 mg via ORAL
  Filled 2017-09-04 (×8): qty 1

## 2017-09-04 MED ORDER — SENNOSIDES-DOCUSATE SODIUM 8.6-50 MG PO TABS
1.0000 | ORAL_TABLET | Freq: Every evening | ORAL | Status: DC | PRN
Start: 2017-09-04 — End: 2017-09-12
  Administered 2017-09-07: 1 via ORAL
  Filled 2017-09-04: qty 1

## 2017-09-04 MED ORDER — BISACODYL 10 MG RE SUPP
10.0000 mg | Freq: Every day | RECTAL | Status: DC | PRN
  Filled 2017-09-04: qty 1

## 2017-09-04 MED ORDER — CARVEDILOL 25 MG PO TABS
25.0000 mg | ORAL_TABLET | Freq: Two times a day (BID) | ORAL | Status: DC
  Administered 2017-09-04 – 2017-09-12 (×16): 25 mg via ORAL
  Filled 2017-09-04 (×16): qty 1

## 2017-09-04 MED ORDER — TRAZODONE HCL 50 MG PO TABS
25.0000 mg | ORAL_TABLET | Freq: Every evening | ORAL | Status: DC | PRN
  Administered 2017-09-04 – 2017-09-05 (×2): 50 mg via ORAL
  Filled 2017-09-04 (×2): qty 1

## 2017-09-04 MED ORDER — ENOXAPARIN SODIUM 40 MG/0.4ML ~~LOC~~ SOLN
40.0000 mg | SUBCUTANEOUS | Status: DC
  Administered 2017-09-04 – 2017-09-11 (×8): 40 mg via SUBCUTANEOUS
  Filled 2017-09-04 (×8): qty 0.4

## 2017-09-04 MED ORDER — HYDROXYZINE HCL 25 MG PO TABS: 50.0000 mg | ORAL_TABLET | Freq: Three times a day (TID) | ORAL | Status: DC | PRN

## 2017-09-04 MED ORDER — INSULIN ASPART 100 UNIT/ML ~~LOC~~ SOLN
4.0000 [IU] | Freq: Three times a day (TID) | SUBCUTANEOUS | Status: DC
  Administered 2017-09-04 – 2017-09-12 (×23): 4 [IU] via SUBCUTANEOUS

## 2017-09-04 MED ORDER — TORSEMIDE 10 MG PO TABS
10.0000 mg | ORAL_TABLET | Freq: Two times a day (BID) | ORAL | Status: DC
  Administered 2017-09-04 – 2017-09-12 (×16): 10 mg via ORAL
  Filled 2017-09-04 (×18): qty 1

## 2017-09-04 MED ORDER — CLOPIDOGREL BISULFATE 75 MG PO TABS
75.0000 mg | ORAL_TABLET | Freq: Every day | ORAL | Status: DC
  Administered 2017-09-04: 75 mg via ORAL
  Filled 2017-09-04: qty 1

## 2017-09-04 MED ORDER — ENOXAPARIN SODIUM 40 MG/0.4ML ~~LOC~~ SOLN: 40.0000 mg | SUBCUTANEOUS | Status: DC

## 2017-09-04 MED ORDER — ATORVASTATIN CALCIUM 10 MG PO TABS
10.0000 mg | ORAL_TABLET | Freq: Every day | ORAL | Status: DC
  Administered 2017-09-04 – 2017-09-11 (×8): 10 mg via ORAL
  Filled 2017-09-04 (×8): qty 1

## 2017-09-04 MED ORDER — INSULIN ASPART 100 UNIT/ML ~~LOC~~ SOLN
0.0000 [IU] | Freq: Every day | SUBCUTANEOUS | Status: DC
  Administered 2017-09-04: 3 [IU] via SUBCUTANEOUS
  Administered 2017-09-05 – 2017-09-10 (×2): 2 [IU] via SUBCUTANEOUS

## 2017-09-04 MED ORDER — TORSEMIDE 20 MG PO TABS
10.0000 mg | ORAL_TABLET | Freq: Two times a day (BID) | ORAL | Status: DC
  Administered 2017-09-04: 10 mg via ORAL
  Filled 2017-09-04: qty 1

## 2017-09-04 MED ORDER — TRAMADOL HCL 50 MG PO TABS: 50.0000 mg | ORAL_TABLET | Freq: Four times a day (QID) | ORAL | Status: DC | PRN

## 2017-09-04 MED ORDER — CLOPIDOGREL BISULFATE 75 MG PO TABS
75.0000 mg | ORAL_TABLET | Freq: Every day | ORAL | Status: DC
Start: 2017-09-04 — End: 2017-09-12

## 2017-09-04 MED ORDER — ASPIRIN EC 325 MG PO TBEC
325.0000 mg | DELAYED_RELEASE_TABLET | Freq: Every day | ORAL | Status: DC
  Administered 2017-09-05 – 2017-09-12 (×8): 325 mg via ORAL
  Filled 2017-09-04 (×8): qty 1

## 2017-09-04 MED ORDER — DIPHENHYDRAMINE HCL 12.5 MG/5ML PO ELIX: 12.5000 mg | ORAL_SOLUTION | Freq: Four times a day (QID) | ORAL | Status: DC | PRN

## 2017-09-04 MED ORDER — PROCHLORPERAZINE 25 MG RE SUPP: 12.5000 mg | Freq: Four times a day (QID) | RECTAL | Status: DC | PRN

## 2017-09-04 MED ORDER — CARVEDILOL 25 MG PO TABS
25.0000 mg | ORAL_TABLET | Freq: Two times a day (BID) | ORAL | Status: DC
  Administered 2017-09-04: 25 mg via ORAL
  Filled 2017-09-04: qty 1

## 2017-09-04 MED ORDER — INSULIN ASPART 100 UNIT/ML ~~LOC~~ SOLN
0.0000 [IU] | Freq: Three times a day (TID) | SUBCUTANEOUS | Status: DC
  Administered 2017-09-04: 3 [IU] via SUBCUTANEOUS
  Administered 2017-09-05: 2 [IU] via SUBCUTANEOUS
  Administered 2017-09-05: 3 [IU] via SUBCUTANEOUS
  Administered 2017-09-05: 2 [IU] via SUBCUTANEOUS
  Administered 2017-09-06: 1 [IU] via SUBCUTANEOUS
  Administered 2017-09-06 (×2): 2 [IU] via SUBCUTANEOUS
  Administered 2017-09-07: 1 [IU] via SUBCUTANEOUS
  Administered 2017-09-07: 2 [IU] via SUBCUTANEOUS
  Administered 2017-09-07 – 2017-09-08 (×4): 1 [IU] via SUBCUTANEOUS
  Administered 2017-09-09: 2 [IU] via SUBCUTANEOUS
  Administered 2017-09-09 (×2): 1 [IU] via SUBCUTANEOUS
  Administered 2017-09-10: 3 [IU] via SUBCUTANEOUS
  Administered 2017-09-10: 2 [IU] via SUBCUTANEOUS
  Administered 2017-09-10: 1 [IU] via SUBCUTANEOUS
  Administered 2017-09-11 (×2): 2 [IU] via SUBCUTANEOUS
  Administered 2017-09-11: 1 [IU] via SUBCUTANEOUS
  Administered 2017-09-12: 2 [IU] via SUBCUTANEOUS

## 2017-09-04 MED ORDER — FLEET ENEMA 7-19 GM/118ML RE ENEM: 1.0000 | ENEMA | Freq: Once | RECTAL | Status: DC | PRN

## 2017-09-04 MED ORDER — POLYETHYLENE GLYCOL 3350 17 G PO PACK
17.0000 g | PACK | Freq: Every day | ORAL | Status: DC | PRN
  Administered 2017-09-05 – 2017-09-07 (×2): 17 g via ORAL
  Filled 2017-09-04 (×2): qty 1

## 2017-09-04 MED ORDER — VITAMIN B-12 100 MCG PO TABS
100.0000 ug | ORAL_TABLET | Freq: Every day | ORAL | Status: DC
  Administered 2017-09-05 – 2017-09-12 (×8): 100 ug via ORAL
  Filled 2017-09-04 (×8): qty 1

## 2017-09-04 MED ORDER — VITAMIN D 1000 UNITS PO TABS
1000.0000 [IU] | ORAL_TABLET | Freq: Every evening | ORAL | Status: DC
  Administered 2017-09-04 – 2017-09-11 (×8): 1000 [IU] via ORAL
  Filled 2017-09-04 (×8): qty 1

## 2017-09-04 MED ORDER — ISOSORBIDE MONONITRATE ER 30 MG PO TB24
30.0000 mg | ORAL_TABLET | Freq: Every day | ORAL | Status: DC
  Administered 2017-09-04: 30 mg via ORAL
  Filled 2017-09-04: qty 1

## 2017-09-04 MED ORDER — ASPIRIN 325 MG PO TABS: 325.0000 mg | ORAL_TABLET | Freq: Every day | ORAL | Status: DC

## 2017-09-04 MED ORDER — CALCITRIOL 0.25 MCG PO CAPS
0.2500 ug | ORAL_CAPSULE | Freq: Every day | ORAL | Status: DC
  Administered 2017-09-05 – 2017-09-12 (×8): 0.25 ug via ORAL
  Filled 2017-09-04 (×8): qty 1

## 2017-09-04 MED ORDER — TORSEMIDE 20 MG PO TABS: 10.0000 mg | ORAL_TABLET | Freq: Two times a day (BID) | ORAL | Status: DC

## 2017-09-04 MED ORDER — DICLOFENAC SODIUM 1 % TD GEL
2.0000 g | Freq: Three times a day (TID) | TRANSDERMAL | Status: DC
  Administered 2017-09-04 – 2017-09-12 (×18): 2 g via TOPICAL
  Filled 2017-09-04: qty 100

## 2017-09-04 MED ORDER — SERTRALINE HCL 100 MG PO TABS
100.0000 mg | ORAL_TABLET | Freq: Every day | ORAL | Status: DC
  Administered 2017-09-05 – 2017-09-12 (×8): 100 mg via ORAL
  Filled 2017-09-04 (×8): qty 1

## 2017-09-04 MED ORDER — ATORVASTATIN CALCIUM 10 MG PO TABS: 10.0000 mg | ORAL_TABLET | Freq: Every day | ORAL | Status: DC

## 2017-09-04 MED ORDER — PROCHLORPERAZINE EDISYLATE 10 MG/2ML IJ SOLN: 5.0000 mg | Freq: Four times a day (QID) | INTRAMUSCULAR | Status: DC | PRN

## 2017-09-04 MED ORDER — ALUM & MAG HYDROXIDE-SIMETH 200-200-20 MG/5ML PO SUSP
30.0000 mL | ORAL | Status: DC | PRN
Start: 2017-09-04 — End: 2017-09-12

## 2017-09-04 MED ORDER — CLOPIDOGREL BISULFATE 75 MG PO TABS
75.0000 mg | ORAL_TABLET | Freq: Every day | ORAL | Status: DC
  Administered 2017-09-05 – 2017-09-12 (×8): 75 mg via ORAL
  Filled 2017-09-04 (×8): qty 1

## 2017-09-04 MED ORDER — INSULIN ASPART 100 UNIT/ML ~~LOC~~ SOLN: 0.0000 [IU] | Freq: Three times a day (TID) | SUBCUTANEOUS | Status: DC

## 2017-09-04 NOTE — Care Management Important Message (Signed)
Important Message  Patient Details  Name: David [name] MRN: [id number] Date of Birth: Sep 13, 1940   Medicare Important Message Given:  Yes    Cade Olberding 09/04/2017, 1:50 PM

## 2017-09-04 NOTE — Discharge Instructions (Signed)
Please get your medications reviewed and adjusted by your Primary MD. ° °Please request your Primary MD to go over all Hospital Tests and Procedure/Radiological results at the follow up, please get all Hospital records sent to your Prim MD by signing hospital release before you go home. ° °If you had Pneumonia of Lung problems at the Hospital: °Please get a 2 view Chest X ray done in 6-8 weeks after hospital discharge or sooner if instructed by your Primary MD. ° °If you have Congestive Heart Failure: °Please call your Cardiologist or Primary MD anytime you have any of the following symptoms:  °1) 3 pound weight gain in 24 hours or 5 pounds in 1 week  °2) shortness of breath, with or without a dry hacking cough  °3) swelling in the hands, feet or stomach  °4) if you have to sleep on extra pillows at night in order to breathe ° °Follow cardiac low salt diet and 1.5 lit/day fluid restriction. ° °If you have diabetes °Accuchecks 4 times/day, Once in AM empty stomach and then before each meal. °Log in all results and show them to your primary doctor at your next visit. °If any glucose reading is under 80 or above 300 call your primary MD immediately. ° °If you have Seizure/Convulsions/Epilepsy: °Please do not drive, operate heavy machinery, participate in activities at heights or participate in high speed sports until you have seen by Primary MD or a Neurologist and advised to do so again. ° °If you had Gastrointestinal Bleeding: °Please ask your Primary MD to check a complete blood count within one week of discharge or at your next visit. Your endoscopic/colonoscopic biopsies that are pending at the time of discharge, will also need to followed by your Primary MD. ° °Get Medicines reviewed and adjusted. °Please take all your medications with you for your next visit with your Primary MD ° °Please request your Primary MD to go over all hospital tests and procedure/radiological results at the follow up, please ask your  Primary MD to get all Hospital records sent to his/her office. ° °If you experience worsening of your admission symptoms, develop shortness of breath, life threatening emergency, suicidal or homicidal thoughts you must seek medical attention immediately by calling 911 or calling your MD immediately  if symptoms less severe. ° °You must read complete instructions/literature along with all the possible adverse reactions/side effects for all the Medicines you take and that have been prescribed to you. Take any new Medicines after you have completely understood and accpet all the possible adverse reactions/side effects.  ° °Do not drive or operate heavy machinery when taking Pain medications.  ° °Do not take more than prescribed Pain, Sleep and Anxiety Medications ° °Special Instructions: If you have smoked or chewed Tobacco  in the last 2 yrs please stop smoking, stop any regular Alcohol  and or any Recreational drug use. ° °Wear Seat belts while driving. ° °Please note °You were cared for by a hospitalist during your hospital stay. If you have any questions about your discharge medications or the care you received while you were in the hospital after you are discharged, you can call the unit and asked to speak with the hospitalist on call if the hospitalist that took care of you is not available. Once you are discharged, your primary care physician will handle any further medical issues. Please note that NO REFILLS for any discharge medications will be authorized once you are discharged, as it is imperative that you   return to your primary care physician (or establish a relationship with a primary care physician if you do not have one) for your aftercare needs so that they can reassess your need for medications and monitor your lab values.  You can reach the hospitalist office at phone [phone] or fax [phone]   If you do not have a primary care physician, you can call [phone] for a physician  referral.   Stroke Prevention Some medical conditions and behaviors are associated with a higher chance of having a stroke. You can help prevent a stroke by making nutrition, lifestyle, and other changes, including managing any medical conditions you may have. What nutrition changes can be made?  Eat healthy foods. You can do this by: ? Choosing foods high in fiber, such as fresh fruits and vegetables and whole grains. ? Eating at least 5 or more servings of fruits and vegetables a day. Try to fill half of your plate at each meal with fruits and vegetables. ? Choosing lean protein foods, such as lean cuts of meat, poultry without skin, fish, tofu, beans, and nuts. ? Eating low-fat dairy products. ? Avoiding foods that are high in salt (sodium). This can help lower blood pressure. ? Avoiding foods that have saturated fat, trans fat, and cholesterol. This can help prevent high cholesterol. ? Avoiding processed and premade foods.  Follow your health care provider's specific guidelines for losing weight, controlling high blood pressure (hypertension), lowering high cholesterol, and managing diabetes. These may include: ? Reducing your daily calorie intake. ? Limiting your daily sodium intake to 1,500 milligrams (mg). ? Using only healthy fats for cooking, such as olive oil, canola oil, or sunflower oil. ? Counting your daily carbohydrate intake. What lifestyle changes can be made?  Maintain a healthy weight. Talk to your health care provider about your ideal weight.  Get at least 30 minutes of moderate physical activity at least 5 days a week. Moderate activity includes brisk walking, biking, and swimming.  Do not use any products that contain nicotine or tobacco, such as cigarettes and e-cigarettes. If you need help quitting, ask your health care provider. It may also be helpful to avoid exposure to secondhand smoke.  Limit alcohol intake to no more than 1 drink a day for nonpregnant women  and 2 drinks a day for men. One drink equals 12 oz of beer, 5 oz of wine, or 1 oz of hard liquor.  Stop any illegal drug use.  Avoid taking birth control pills. Talk to your health care provider about the risks of taking birth control pills if: ? You are over 33 years old. ? You smoke. ? You get migraines. ? You have ever had a blood clot. What other changes can be made?  Manage your cholesterol levels. ? Eating a healthy diet is important for preventing high cholesterol. If cholesterol cannot be managed through diet alone, you may also need to take medicines. ? Take any prescribed medicines to control your cholesterol as told by your health care provider.  Manage your diabetes. ? Eating a healthy diet and exercising regularly are important parts of managing your blood sugar. If your blood sugar cannot be managed through diet and exercise, you may need to take medicines. ? Take any prescribed medicines to control your diabetes as told by your health care provider.  Control your hypertension. ? To reduce your risk of stroke, try to keep your blood pressure below 130/80. ? Eating a healthy diet and exercising regularly are  an important part of controlling your blood pressure. If your blood pressure cannot be managed through diet and exercise, you may need to take medicines. ? Take any prescribed medicines to control hypertension as told by your health care provider. ? Ask your health care provider if you should monitor your blood pressure at home. ? Have your blood pressure checked every year, even if your blood pressure is normal. Blood pressure increases with age and some medical conditions.  Get evaluated for sleep disorders (sleep apnea). Talk to your health care provider about getting a sleep evaluation if you snore a lot or have excessive sleepiness.  Take over-the-counter and prescription medicines only as told by your health care provider. Aspirin or blood thinners (antiplatelets  or anticoagulants) may be recommended to reduce your risk of forming blood clots that can lead to stroke.  Make sure that any other medical conditions you have, such as atrial fibrillation or atherosclerosis, are managed. What are the warning signs of a stroke? The warning signs of a stroke can be easily remembered as BEFAST.  B is for balance. Signs include: ? Dizziness. ? Loss of balance or coordination. ? Sudden trouble walking.  E is for eyes. Signs include: ? A sudden change in vision. ? Trouble seeing.  F is for face. Signs include: ? Sudden weakness or numbness of the face. ? The face or eyelid drooping to one side.  A is for arms. Signs include: ? Sudden weakness or numbness of the arm, usually on one side of the body.  S is for speech. Signs include: ? Trouble speaking (aphasia). ? Trouble understanding.  T is for time. ? These symptoms may represent a serious problem that is an emergency. Do not wait to see if the symptoms will go away. Get medical help right away. Call your local emergency services (911 in the [place] Do not drive yourself to the hospital.  Other signs of stroke may include: ? A sudden, severe headache with no known cause. ? Nausea or vomiting. ? Seizure.  Where to find more information: For more information, visit:  American Stroke Association: www.strokeassociation.org  National Stroke Association: www.stroke.org  Summary  You can prevent a stroke by eating healthy, exercising, not smoking, limiting alcohol intake, and managing any medical conditions you may have.  Do not use any products that contain nicotine or tobacco, such as cigarettes and e-cigarettes. If you need help quitting, ask your health care provider. It may also be helpful to avoid exposure to secondhand smoke.  Remember BEFAST for warning signs of stroke. Get help right away if you or a loved one has any of these signs. This information is not intended to replace advice  given to you by your health care provider. Make sure you discuss any questions you have with your health care provider. Document Released: 06/09/2004 Document Revised: 06/07/2016 Document Reviewed: 06/07/2016 Elsevier Interactive Patient Education  [employer].

## 2017-09-04 NOTE — H&P (Signed)
Physical Medicine and Rehabilitation Admission H&P     Chief Complaint  Patient presents with  . Stroke with functional deficits.   HPI: David [name] is a 77 year old right handed male with history of type 2 diabetes mellitus with neuropathy, CAD, HCM s/p PPM, CKD stage III, age-related macular degeneration who was admitted on 08/31/2016 with confusion, left sided numbness and weakness as well as speech difficulty. CT of head was negative for acute changes. MRI of brain done revealing 2 small areas of restricted diffusion in the right frontal cortex compatible with acute infarct. 2D echo done showing EF of 60-65% with trivial pericardial effusion. Cardiology was consulted for input and he underwent TEE revealing normal LVEF, no PFO, no thrombus and fixed plaque in the thoracic aorta. Stroke felt to be embolic due to unknonw source and ASA and Plavix recommended X 3 weeks followed by Plavix alone. Patient o follow up with Dr. Leonie Man and Dr. Caryl Comes after discharge.  He had episode of syncope with fall on 4/20 and lack of pulse. Code initiated with CPR X 1 minute and dose of atropine with return of pulse and back to baseline. PPM interrogated and no A fib noted--syncope fell to be due to hypotension and medications held. He has had few beats of asymptomatic NSVT 4/21 and coreg resumed at lower dose. Low dose torsemide resumed today with recommendations to Monitor BP. Bouts of agitation resolving and family providing supervision for safety? Therapy ongoing with cognitive deficits and weakness affecting ADLs as well as mobility. CIR recommended due to functional deficits.  Review of Systems  Constitutional: Negative for chills and fever.  HENT: Negative for hearing loss and tinnitus.  Respiratory: Negative for cough and shortness of breath.  Cardiovascular: Negative for chest pain and palpitations.  Gastrointestinal: Negative for constipation, heartburn and nausea.  Genitourinary: Negative for dysuria and  urgency.  Musculoskeletal: Positive for joint pain (chronic left knee pain).  Skin: Negative for rash.  Neurological: Positive for speech change and weakness.  Psychiatric/Behavioral: Positive for memory loss. The patient is not nervous/anxious.       Past Medical History:  Diagnosis Date  . Anemia, iron deficiency   . Anxiety   . Arthritis   . BPH (benign prostatic hypertrophy)   . CAD (coronary artery disease)    Nonobstructive CAD per cath  . Cardiac pacemaker in situ   . CHF (congestive heart failure) ([place])   . Chronic ulcer of right foot ([place])   . CKD (chronic kidney disease), stage III ([place]) secondary to DM and HTN   nephrologist- [name]  . Dyspnea   . History of cellulitis    right great toe 10-25-2014  . History of skin cancer   . HOCM (hypertrophic obstructive cardiomyopathy) ([place])   . Hypertension   . Insulin dependent type 2 diabetes mellitus ([place]) 1991   followd by [name]-- has insulin pump  . Insulin pump in place   . OSA on CPAP   . Peripheral neuropathy    severe  . Peripheral vascular disease ([place])    bilateral lower extremities  . Rib fracture 07/24/2015  . Secondary hyperparathyroidism of renal origin ([place])   . Sinus node dysfunction ([facility])   . Sleep apnea         Past Surgical History:  Procedure Laterality Date  . AMPUTATION OF REPLICATED TOES  Mar 9476   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)  . AMPUTATION TOE Right 03/12/2015   Procedure: RIGHT HALLUS AMPUTATION ; Surgeon: Jenean Lindau  Melony Overly, MD; Location: [facility]; Service: Podiatry; Laterality: Right;  . CARDIAC CATHETERIZATION  11-25-2010 [place], [place]   Nonobstructive CAD  . CARDIAC PACEMAKER PLACEMENT  Nov 2009   [employer]  . CHOLECYSTECTOMY N/A 03/05/2017   Procedure: LAPAROSCOPIC CHOLECYSTECTOMY WITH INTRAOPERATIVE CHOLANGIOGRAM; Surgeon: [name], MD; Location: [facility]; Service: General; Laterality: N/A;  . EP IMPLANTABLE DEVICE N/A 06/03/2015   Procedure: PPM Generator  Changeout; Surgeon: [name], MD; Location: [facility]; Service: Cardiovascular; Laterality: N/A;  . EXCISION BONE CYST Right 03/06/2015   Procedure: BONE BIOPSIES OF RIGHT FOOT; Surgeon: [name], MD; Location: [facility]; Service: Podiatry; Laterality: Right;  . ORIF ANKLE FRACTURE Left 11/06/2014   Procedure: OPEN REDUCTION INTERNAL FIXATION (ORIF) LEFT ANKLE FRACTURE; Surgeon: [name], MD; Location: [facility]; Service: Orthopedics; Laterality: Left;  . TEE WITHOUT CARDIOVERSION N/A 09/01/2017   Procedure: TRANSESOPHAGEAL ECHOCARDIOGRAM (TEE); Surgeon: [name], MD; Location: [facility]; Service: Cardiovascular; Laterality: N/A;  . TOTAL KNEE ARTHROPLASTY    . VEIN LIGATION AND STRIPPING          Family History  Problem Relation Age of Onset  . Cancer Mother    breast  . Heart attack Father    Social History: Married. Retired Chief Financial Officer. Per reports that he quit smoking about 33 years ago. He has a 60.00 pack-year smoking history. He has quit using smokeless tobacco. He reports that he drinks about 0.6 oz of alcohol per week. He reports that he does not use drugs.       Allergies  Allergen Reactions  . Adhesive [Tape]     blisters         Medications Prior to Admission  Medication Sig Dispense Refill  . amLODipine (NORVASC) 10 MG tablet Take 1 tablet (10 mg total) by mouth daily. 90 tablet 3  . aspirin EC 81 MG tablet Take 81 mg by mouth daily.    . calcitRIOL (ROCALTROL) 0.25 MCG capsule Take 0.25 mcg by mouth daily.     . carvedilol (COREG) 25 MG tablet Take 1 tablet (25 mg total) by mouth 2 (two) times daily with a meal. 180 tablet 1  . cholecalciferol (VITAMIN D) 1000 units tablet Take 1 tablet (1,000 Units total) by mouth daily. (Patient taking differently: Take 1,000 Units by mouth every evening. ) 30 tablet 1  . doxazosin (CARDURA) 4 MG tablet TAKE 1 TABLET BY MOUTH ONCE DAILY 90 tablet 0  . hydrOXYzine  (ATARAX/VISTARIL) 50 MG tablet Take 1 tablet (50 mg total) 3 (three) times daily as needed by mouth. 30 tablet 1  . Icosapent Ethyl (VASCEPA) 1 g CAPS 2 caps bid 120 capsule 2  . insulin regular human CONCENTRATED (HUMULIN R) 500 UNIT/ML injection USE UP TO 0.5 ML IN INSULIN PUMP daily. 1 vial 1  . isosorbide mononitrate (IMDUR) 30 MG 24 hr tablet TAKE TWO TABLETS BY MOUTH ONCE DAILY 60 tablet 11  . liraglutide (VICTOZA) 18 MG/3ML SOPN INJECT 0.3MLS (1.8 MG TOTAL) INTO THE SKIN DAILY 9 pen 1  . lisinopril (PRINIVIL,ZESTRIL) 5 MG tablet Take 5 mg by mouth every evening.     . Omega-3 Fatty Acids (FISH OIL) 1000 MG CAPS Take by mouth 2 (two) times daily.    . pantoprazole (PROTONIX) 40 MG tablet TAKE ONE TABLET BY MOUTH ONCE DAILY 90 tablet 1  . sertraline (ZOLOFT) 100 MG tablet Take 100 mg by mouth daily.    . traMADol (ULTRAM) 50 MG tablet Take  1-2 tablets (50-100 mg total) by mouth every 6 (six) hours as needed for moderate pain or severe pain. 30 tablet 0  . vitamin B-12 (CYANOCOBALAMIN) 100 MCG tablet Take 100 mcg by mouth daily.    . [DISCONTINUED] torsemide (DEMADEX) 20 MG tablet Take 20 mg by mouth 2 (two) times daily.     . insulin regular human CONCENTRATED (HUMULIN R) 500 UNIT/ML injection USE 0.25 ML DAILY OR AS DIRECTED IN INSULIN PUMP E11.65 (Patient not taking: Reported on 08/30/2017) 20 mL 4   Drug Regimen Review  Drug regimen was reviewed and remains appropriate with no significant issues identified  Home:  Home Living  Family/patient expects to be discharged to:: Private residence  Living Arrangements: Spouse/significant other  Available Help at Discharge: Family, Available 24 hours/day  Type of Home: House  Home Access: Stairs to enter  [employer] of Steps: 1  Entrance Stairs-Rails: Right  Home Layout: One level  Bathroom Shower/Tub: Tub/shower unit, Artist: Standard  Bathroom Accessibility: Yes  Home Equipment: Environmental consultant - 2 wheels, [place] -  single point, Civil engineer, contracting  Lives With: Spouse  Functional History:  Prior Function  Level of Independence: Independent with assistive device(s)  Comments: used cane and furniture walked; drove but wife states it was "scarey"  Functional Status:  Mobility:  Bed Mobility  Overal bed mobility: Needs Assistance  Bed Mobility: Supine to Sit, Sit to Supine  Supine to sit: Min assist  Sit to supine: Min guard  General bed mobility comments: initially posterior lean  Transfers  Overall transfer level: Needs assistance  Equipment used: Rolling walker (2 wheeled)  Transfers: Sit to/from Stand  Sit to Stand: Mod assist, +2 physical assistance  General transfer comment: poor use of RW; no knee buskling noted; poor proprioception of LLE; hx of peripheral neuropathy; did not follow commands for safe use of RW. Performed X 2, as pt abruptly sitting on first attempt.  Ambulation/Gait  General Gait Details: Pt with poor sequencing and unable to follow commands to take side steps at EOB. Pt with decreased awareness of RUE and unable to maintain placement on RW.   ADL:  ADL  Overall ADL's : Needs assistance/impaired  Eating/Feeding: Set up, Supervision/ safety, Sitting  Grooming: Minimal assistance, Sitting  Upper Body Bathing: Minimal assistance, Sitting  Lower Body Bathing: Moderate assistance, Sit to/from stand  Upper Body Dressing : Moderate assistance, Sitting  Lower Body Dressing: Maximal assistance, Sit to/from stand  Toilet Transfer: RW, Moderate assistance  Toileting- Clothing Manipulation and Hygiene: Moderate assistance  Toileting - Clothing Manipulation Details (indicate cue type and reason): began to urinate prior to placement of urinal  Functional mobility during ADLs: Moderate assistance, +2 for physical assistance, Rolling walker, Cueing for safety, Cueing for sequencing(limited)  Cognition:  Cognition  Overall Cognitive Status: Impaired/Different from baseline  Arousal/Alertness:  Awake/alert  Orientation Level: Oriented X4  Attention: Focused, Sustained, Selective  Focused Attention: Appears intact  Sustained Attention: Appears intact  Selective Attention: Impaired  Selective Attention Impairment: Functional basic, Verbal basic  Memory: Impaired  Memory Impairment: Storage deficit, Decreased recall of new information(delayed recall 2/5)  Awareness: Impaired  Awareness Impairment: Emergent impairment  Problem Solving: Impaired  Problem Solving Impairment: Functional complex  Executive Function: Organizing, Sequencing  Sequencing: Impaired  Sequencing Impairment: Functional complex, Verbal complex  Organizing: Impaired  Organizing Impairment: Functional basic(clock drawing impaired)  Safety/Judgment: Impaired  Cognition  Arousal/Alertness: Lethargic, Suspect due to medications  Behavior During Therapy: Flat affect  Overall Cognitive  Status: Impaired/Different from baseline  Area of Impairment: Orientation, Attention, Following commands, Safety/judgement, Awareness, Problem solving  Orientation Level: Disoriented to, Time, Situation  Current Attention Level: Sustained  Following Commands: Follows one step commands inconsistently  Safety/Judgement: Decreased awareness of safety, Decreased awareness of deficits  Awareness: Emergent  Problem Solving: Slow processing, Decreased initiation, Difficulty sequencing, Requires verbal cues, Requires tactile cues  General Comments: cognition most likely impacted by medicatioin but is impaired. will further assess  Physical Exam:  Blood pressure (!) 154/60, pulse 68, temperature 99.3 F (37.4 C), temperature source Oral, resp. rate (!) 24, height 6\' 4"  (1.93 m), weight 130.2 kg (287 lb), SpO2 97 %.  Physical Exam  Nursing note and vitals reviewed.  Constitutional: He appears well-developed and well-nourished. No distress.  Obese male. Daughter in room to provide supervision.  HENT:  Head: Normocephalic and atraumatic.   Mouth/Throat: Oropharynx is clear and moist.  Eyes: Pupils are equal, round, and reactive to light. Conjunctivae are normal.  Neck: Normal range of motion. Neck supple.  Cardiovascular: Regular rhythm. Tachycardia present.  Respiratory: Effort normal. No stridor. He has decreased breath sounds.  GI: Soft. Bowel sounds are normal. He exhibits distension. There is no tenderness.  Musculoskeletal: He exhibits edema.  Well healed right 1st and 2nd toe amputation site. BLE with min edema and woody appearing stasis changes. Left knee with mild effusions, chronic changes, mild crepitus with extension and flexion. Meniscal maneuvers equivocal.  Neurological: He is alert. A cranial nerve deficit is present.  Left ptosis with left facial weakness. Minimal dysarthria. Oriented to self and place. Situation "heart problems" or " fall". He is able to follow simple two step commands with redirection. 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 4/5 LUE , LLE 3+/5 prox to 4- distally, somewhat limited to knee pain. RUE and RLE 4+/5. Distal sensory loss in both feet  Skin: Skin is warm and dry. He is not diaphoretic.  Psychiatric: He has a normal mood and affect. His speech is normal. He is slowed. Cognition and memory are impaired. He expresses inappropriate judgment.   Lab Results Last 48 Hours  Imaging Results (Last 48 hours)     Medical  Problem List and Plan:  1. 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 DVT Prophylaxis/Anticoagulation: Pharmaceutical: Lovenox  3. Pain Management/OA left knee  -tylenol or tramadol  - prn. Ice  -voltaren gel  -check knee xrays  4. Mood: LCSW to follow for evaluation and support. Mood stable on home dose Zoloft.  5. Neuropsych: This patient is not capable of making decisions on his own behalf.  6. Skin/Wound Care: routine pressure relief measures.  7. Fluids/Electrolytes/Nutrition: Monitor I/O. Check lytes in am. Offer supplements prn poor intake.  8. HCM/PPM: Recent syncope with lack of pulse---monitor orthostatic vitals. Continue low dose coreg.  9. Chronic diastolic CHF: Heart healthy diet with daily weights. Lisinopril and Cardura on hold to prevent recurrent syncope/hypotension. Back on torsemide. Continue ASA, Lipitor  and coreg. Monitor for signs of fluid overload. Will order TEDs to help manage chronic peripheral edema.  10 T2DM with neuropathy and nephropathy: Was on insulin pump at home but currently not competent to use it. Monitor BS ac/hs and titrate insulin as needed. Continue lantus insulin with meal coverage--wife has been educated by diabetes coordinator and to follow up with Dr. Dwyane Dee after discharge for instructions on resumption.  11. OSA: Continue CPAP at nights/when asleep.  12. Asymptomatic tachycardia/NSVT: Monitor for symptoms. Coreg titrated up to home dose today.    Post Admission Physician Evaluation: 1. Functional deficits secondary  to right frontal CVA. 2. Patient is admitted to receive collaborative, interdisciplinary care between the physiatrist, rehab nursing staff, and therapy team. 3. Patient's level of medical complexity and substantial therapy needs in context of that medical necessity cannot be provided at a lesser intensity of care such as a SNF. 4. Patient has experienced substantial functional loss from his/her  baseline which was documented above under the "Functional History" and "Functional Status" headings.  Judging by the patient's diagnosis, physical exam, and functional history, the patient has potential for functional progress which will result in measurable gains while on inpatient rehab.  These gains will be of substantial and practical use upon discharge  in facilitating mobility and self-care at the household level. 5. Physiatrist will provide 24 hour management of medical needs as well as oversight of the therapy plan/treatment and provide guidance as appropriate regarding the interaction of the two. 6. The Preadmission Screening has been reviewed and patient status is unchanged unless otherwise stated above. 7. 24 hour rehab nursing will assist with bladder management, bowel management, safety, skin/wound care, disease management, medication administration, pain management and patient education  and help integrate therapy concepts, techniques,education, etc. 8. PT will assess and treat for/with: Lower extremity strength, range of motion, stamina, balance, functional mobility, safety, adaptive techniques and equipment, NMR, pain mgt, orthotics.   Goals are: supervision. 9. OT will assess and treat for/with: ADL's, functional mobility, safety, upper extremity strength, adaptive techniques and equipment, NMR, family education, pain control.   Goals are: supervision. Therapy may proceed with showering this patient. 10. SLP will assess and treat for/with: cognition, communication.  Goals are: mod I to supervision. 11. Case Management and Social Worker will assess and treat for psychological issues and discharge planning. 12. Team conference will be held weekly to assess progress toward goals and to determine barriers to discharge. 13. Patient will receive at least 3 hours of therapy per day at least 5 days per week. 14. ELOS: 8-12 days  15. Prognosis:  excellent    I have personally performed a  face to face diagnostic evaluation of this patient. Additionally, I have reviewed and concur with the physician assistant's documentation above.  Meredith Staggers, MD, FAAPMR a Reesa Chew, [facility]

## 2017-09-04 NOTE — Progress Notes (Signed)
Rehab admissions - I met with patient and family today.  Patient is up in chair looking much better today than last Friday.  Family now all in agreement to inpatient rehab.  Bed available and will admit to CIR today.  Call me for questions.  #[phone]

## 2017-09-04 NOTE — Progress Notes (Signed)
Transferred pt to IR at this time.  Pt has no s/s of any acute distress or c/o pain.

## 2017-09-04 NOTE — Progress Notes (Signed)
Jamse Arn, MD  Physician  Physical Medicine and Rehabilitation  Consult Note  Addendum  Date of Service:  09/01/2017 6:22 AM       Related encounter: ED to Hosp-Admission (Current) from 08/30/2017 in [facility] All Collapse All       Show:Clear all [x] Manual[x] Template[] Copied  Added by: [x] Angiulli, Lavon Paganini, PA-C[x] Jamse Arn, MD   [] Hover for details        Physical Medicine and Rehabilitation Consult Reason for Consult: Decreased functional mobility related to left side weakness Referring Physician: [facility]   HPI: SHAYDON LEASE is a 77 y.o. right-handed male with history of CAD/pacemaker maintained on aspirin, diastolic congestive heart failure, CKD stage III, chronic right foot ulcer, diabetes mellitus with insulin pump, OSA on CPAP, PVD with right second toe amputation.  Per chart review patient lives with spouse.  Presented 08/31/2017 with left-sided weakness times 2 days.  Cranial CT reviewed, unremarkable for acute intracranial process.  Patient did not receive TPA.  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  Echocardiogram with ejection fraction of 94% grade 1 diastolic dysfunction.  Neurology follow-up with workup ongoing.  Patient with ongoing bouts of agitation and restlessness, with patient swinging at staff nursing, and a sitter was provided for patient safety.  Presently on aspirin for CVA prophylaxis.  Subcutaneous Lovenox for DVT prophylaxis.  Physical and occupational therapy evaluations are pending.  MD has requested physical medicine rehab consult.   Review of Systems  Unable to perform ROS: Acuity of condition       Past Medical History:  Diagnosis Date  . Anemia, iron deficiency   . Anxiety   . Arthritis   . BPH (benign prostatic hypertrophy)   . CAD (coronary artery disease)    Nonobstructive CAD per cath  . Cardiac pacemaker in situ   . CHF (congestive heart  failure) ([place])   . Chronic ulcer of right foot ([place])   . CKD (chronic kidney disease), stage III ([place]) secondary to DM and HTN   nephrologist-  [name]  . Dyspnea   . History of cellulitis    right great toe 10-25-2014  . History of skin cancer   . HOCM (hypertrophic obstructive cardiomyopathy) ([place])   . Hypertension   . Insulin dependent type 2 diabetes mellitus ([place]) 1991   followd by [name]--  has insulin pump  . Insulin pump in place   . OSA on CPAP   . Peripheral neuropathy    severe  . Peripheral vascular disease ([place])    bilateral lower extremities  . Rib fracture 07/24/2015  . Secondary hyperparathyroidism of renal origin ([place])   . Sinus node dysfunction ([facility])   . Sleep apnea         Past Surgical History:  Procedure Laterality Date  . AMPUTATION OF REPLICATED TOES  Mar 4967   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)  . AMPUTATION TOE Right 03/12/2015   Procedure: RIGHT HALLUS AMPUTATION ;  Surgeon: [name], MD;  Location: [facility];  Service: Podiatry;  Laterality: Right;  . CARDIAC CATHETERIZATION  11-25-2010   [place], [place]   Nonobstructive CAD  . CARDIAC PACEMAKER PLACEMENT  Nov 2009   [employer]  . CHOLECYSTECTOMY N/A 03/05/2017   Procedure: LAPAROSCOPIC CHOLECYSTECTOMY WITH INTRAOPERATIVE CHOLANGIOGRAM;  Surgeon: [name], MD;  Location: [facility];  Service: General;  Laterality: N/A;  . EP IMPLANTABLE DEVICE N/A 06/03/2015   Procedure: PPM Generator Changeout;  Surgeon: [name], MD;  Location: [facility];  Service: Cardiovascular;  Laterality: N/A;  . EXCISION BONE CYST Right 03/06/2015   Procedure: BONE BIOPSIES OF RIGHT FOOT;  Surgeon: [name], MD;  Location: [facility];  Service: Podiatry;  Laterality: Right;  . ORIF ANKLE FRACTURE Left 11/06/2014   Procedure: OPEN REDUCTION INTERNAL FIXATION (ORIF) LEFT  ANKLE FRACTURE;  Surgeon: [name], MD;  Location: [facility];  Service: Orthopedics;  Laterality: Left;  . TOTAL KNEE ARTHROPLASTY    . VEIN LIGATION AND STRIPPING          Family History  Problem Relation Age of Onset  . Cancer Mother        breast  . Heart attack Father    Social History:  reports that he quit smoking about 33 years ago. He has a 60.00 pack-year smoking history. He has quit using smokeless tobacco. He reports that he drinks about 0.6 oz of alcohol per week. He reports that he does not use drugs. Allergies:  Allergies  Allergen Reactions  . Adhesive [Tape]     blisters         Medications Prior to Admission  Medication Sig Dispense Refill  . amLODipine (NORVASC) 10 MG tablet Take 1 tablet (10 mg total) by mouth daily. 90 tablet 3  . aspirin EC 81 MG tablet Take 81 mg by mouth daily.    . calcitRIOL (ROCALTROL) 0.25 MCG capsule Take 0.25 mcg by mouth daily.     . carvedilol (COREG) 25 MG tablet Take 1 tablet (25 mg total) by mouth 2 (two) times daily with a meal. 180 tablet 1  . cholecalciferol (VITAMIN D) 1000 units tablet Take 1 tablet (1,000 Units total) by mouth daily. (Patient taking differently: Take 1,000 Units by mouth every evening. ) 30 tablet 1  . doxazosin (CARDURA) 4 MG tablet TAKE 1 TABLET BY MOUTH ONCE DAILY 90 tablet 0  . hydrOXYzine (ATARAX/VISTARIL) 50 MG tablet Take 1 tablet (50 mg total) 3 (three) times daily as needed by mouth. 30 tablet 1  . Icosapent Ethyl (VASCEPA) 1 g CAPS 2 caps bid 120 capsule 2  . insulin regular human CONCENTRATED (HUMULIN R) 500 UNIT/ML injection USE UP TO 0.5 ML IN INSULIN PUMP daily. 1 vial 1  . isosorbide mononitrate (IMDUR) 30 MG 24 hr tablet TAKE TWO TABLETS BY MOUTH ONCE DAILY 60 tablet 11  . liraglutide (VICTOZA) 18 MG/3ML SOPN INJECT 0.3MLS (1.8 MG TOTAL) INTO THE SKIN DAILY 9 pen 1  . lisinopril (PRINIVIL,ZESTRIL) 5 MG tablet Take 5 mg by mouth every evening.     . Omega-3 Fatty Acids (FISH OIL) 1000 MG CAPS Take by mouth 2 (two) times daily.     . pantoprazole (PROTONIX) 40 MG tablet TAKE ONE TABLET BY MOUTH ONCE DAILY 90 tablet 1  . sertraline (ZOLOFT) 100 MG tablet Take 100 mg by mouth daily.    Marland Kitchen torsemide (DEMADEX) 20 MG tablet Take 20 mg by mouth 2 (two) times daily.     . traMADol (ULTRAM) 50 MG tablet Take 1-2 tablets (50-100 mg total) by mouth every 6 (six) hours as needed for moderate pain or severe pain. 30 tablet 0  . vitamin B-12 (CYANOCOBALAMIN) 100 MCG tablet Take 100 mcg by mouth daily.    . insulin regular human CONCENTRATED (HUMULIN R) 500 UNIT/ML injection USE 0.25 ML DAILY OR AS DIRECTED IN INSULIN PUMP E11.65 (Patient not taking: Reported on 08/30/2017) 20 mL  4    Home: Home Living Family/patient expects to be discharged to:: Private residence Living Arrangements: Spouse/significant other Available Help at Discharge: Family, Available 24 hours/day Type of Home: House Home Access: Stairs to enter [employer] of Steps: 1 Entrance Stairs-Rails: Right Home Layout: One level Bathroom Shower/Tub: Tub/shower unit, Architectural technologist: Standard Bathroom Accessibility: Yes Home Equipment: Environmental consultant - 2 wheels, [place] - single point, Careers adviser History: Prior Function Level of Independence: Independent with assistive device(s) Comments: used cane and furniture walked; drove but wife states it was "scarey" Functional Status:  Mobility: Bed Mobility Overal bed mobility: Needs Assistance Bed Mobility: Supine to Sit, Sit to Supine Supine to sit: Min assist Sit to supine: Min guard General bed mobility comments: initially posterior lean Transfers Overall transfer level: Needs assistance Equipment used: Rolling walker (2 wheeled) Transfers: Sit to/from Stand Sit to Stand: Mod assist, +2 physical assistance General transfer comment: poor use of RW; no knee buskling noted; poor proprioception of LLE; hx of peripheral neuropathy; did not follow commands for safe use of RW. Performed X  2, as pt abruptly sitting on first attempt.  Ambulation/Gait General Gait Details: Pt with poor sequencing and unable to follow commands to take side steps at EOB. Pt with decreased awareness of RUE and unable to maintain placement on RW.   ADL: ADL Overall ADL's : Needs assistance/impaired Eating/Feeding: Set up, Supervision/ safety, Sitting Grooming: Minimal assistance, Sitting Upper Body Bathing: Minimal assistance, Sitting Lower Body Bathing: Moderate assistance, Sit to/from stand Upper Body Dressing : Moderate assistance, Sitting Lower Body Dressing: Maximal assistance, Sit to/from stand Toilet Transfer: RW, Moderate assistance Toileting- Clothing Manipulation and Hygiene: Moderate assistance Toileting - Clothing Manipulation Details (indicate cue type and reason): began to urinate prior to placement of urinal Functional mobility during ADLs: Moderate assistance, +2 for physical assistance, Rolling walker, Cueing for safety, Cueing for sequencing(limited)  Cognition: Cognition Overall Cognitive Status: Impaired/Different from baseline Orientation Level: Oriented to person, Disoriented to place, Disoriented to time, Disoriented to situation Cognition Arousal/Alertness: Lethargic, Suspect due to medications Behavior During Therapy: Flat affect Overall Cognitive Status: Impaired/Different from baseline Area of Impairment: Orientation, Attention, Following commands, Safety/judgement, Awareness, Problem solving Orientation Level: Disoriented to, Time, Situation Current Attention Level: Sustained Following Commands: Follows one step commands inconsistently Safety/Judgement: Decreased awareness of safety, Decreased awareness of deficits Awareness: Emergent Problem Solving: Slow processing, Decreased initiation, Difficulty sequencing, Requires verbal cues, Requires tactile cues General Comments: cognition most likely impacted by medicatioin but is impaired. will further  assess  Blood pressure (!) 121/98, pulse (!) 104, temperature 98.8 F (37.1 C), temperature source Axillary, resp. rate (!) 22, height 6\' 4"  (1.93 m), weight 130.2 kg (287 lb), SpO2 93 %. Physical Exam  Vitals reviewed. Constitutional: He appears well-developed.  Obese  HENT:  Head: Normocephalic.  Eyes: EOM are normal. Right eye exhibits no discharge. Left eye exhibits no discharge.  Neck: Normal range of motion. Neck supple. No thyromegaly present.  Cardiovascular:  Irregularly irregular  Respiratory: Effort normal and breath sounds normal. No respiratory distress.  GI: Bowel sounds are normal. He exhibits distension.  Musculoskeletal:  No edema or tenderness in extremities + Toe amputation  Neurological: He is alert.  Patient is restless and agitated.   He is sitting up in chair with a sitter at bedside.   Exam limited due to lack of participation.   Moving all extremities spontaneously, but not responding to commands.  Skin: Skin is warm and dry.  Psychiatric: His affect  is blunt. His speech is delayed and slurred. He is agitated and slowed. Cognition and memory are impaired. He expresses impulsivity.         Assessment/Plan: Diagnosis: Right frontal lobe infarcts Labs and images independently reviewed.  Records reviewed and summated above. Stroke: Continue secondary stroke prophylaxis and Risk Factor Modification listed below:   Antiplatelet therapy:   Blood Pressure Management:  Continue current medication with prn's with permisive HTN per primary team Statin Agent:   Diabetes management:    1. Does the need for close, 24 hr/day medical supervision in concert with the patient's rehab needs make it unreasonable for this patient to be served in a less intensive setting? Yes  2. Co-Morbidities requiring supervision/potential complications: agitation and restlessness (environmental modifications as tolerated, attempt to limit cognitively affecting meds, such as IV  ativan), diastolic dysfunction (monitor for signs and symptoms of fluid overload), CAD/pacemaker (cont meds), CKD stage III  (avoid nephrotoxic meds), chronic right foot ulcer, diabetes mellitus with insulin pump (Monitor in accordance with exercise and adjust meds as necessary), OSA on CPAP, PVD with right second toe amputation, tachypnea (monitor RR and O2 Sats with increased physical exertion), Afib with RVR (monitor HR with increased mobility), ABLA (transfuse if necessary to ensure appropriate perfusion for increased activity tolerance) 3. Due to bladder management, bowel management, safety, skin/wound care, disease management, medication administration and patient education, does the patient require 24 hr/day rehab nursing? Yes 4. Does the patient require coordinated care of a physician, rehab nurse, PT (1-2 hrs/day, 5 days/week), OT (1-2 hrs/day, 5 days/week) and SLP (1-2 hrs/day, 5 days/week) to address physical and functional deficits in the context of the above medical diagnosis(es)? Yes Addressing deficits in the following areas: balance, endurance, locomotion, strength, transferring, bathing, dressing, toileting, cognition, speech and psychosocial support 5. Can the patient actively participate in an intensive therapy program of at least 3 hrs of therapy per day at least 5 days per week? Potentially 6. The potential for patient to make measurable gains while on inpatient rehab is excellent 7. Anticipated functional outcomes upon discharge from inpatient rehab are supervision  with PT, supervision with OT, supervision and min assist with SLP. 8. Estimated rehab length of stay to reach the above functional goals is: 12-16 days. 9. Anticipated D/C setting: Home 10. Anticipated post D/C treatments: HH therapy and Home excercise program 11. Overall Rehab/Functional Prognosis: good  RECOMMENDATIONS: This patient's condition is appropriate for continued rehabilitative care in the following  setting: Will await completion of medical work-up.  Will consider CIR if functional deficits persist with cognitive improvement and caregiver support available upon discharge. Patient has agreed to participate in recommended program. Potentially Note that insurance prior authorization may be required for reimbursement for recommended care.  Comment: Rehab Admissions Coordinator to follow up.  I have personally performed a face to face diagnostic evaluation, including, but not limited to relevant history and physical exam findings, of this patient and developed relevant assessment and plan.  Additionally, I have reviewed and concur with the physician assistant's documentation above.   Delice Lesch, MD, ABPMR Lavon Paganini Angiulli, PA-C 09/01/2017      Revision History                                  Routing History

## 2017-09-04 NOTE — Consult Note (Signed)
   [facility] CM Inpatient Consult   09/04/2017  David [name] 28-Sep-1940 [id number]  Patient screened for potential [facility] services. Patient is in the [place] of the [facility] services under patient's Medicare plan.  Chart review reveals patient is to transition to inpatient rehab at [facility]  Can follow progress for care management needs for disposition back to community.   Please place a [facility] Care Management consult or for questions contact:   Natividad Brood, RN BSN [facility] Liaison  [phone] business mobile phone Toll free office [phone]

## 2017-09-04 NOTE — Plan of Care (Signed)
Pt being dc'd to IR at this time.  Pt has met goals adequately for DC to IR.  AVS to be sent to IR with patient.

## 2017-09-04 NOTE — Progress Notes (Signed)
Report given to IR nurse.

## 2017-09-04 NOTE — Discharge Summary (Signed)
Physician Discharge Summary  David [name] BUL:[id number] DOB: 1940/05/27  PCP: [name], MD  Admit date: 08/30/2017 Discharge date: 09/04/2017  Recommendations for Outpatient Follow-up:  1. Dr. Dimas Chyle, PCP upon discharge from [facility]. 2. Dr. Virl Axe, EP Cardiology upon discharge from [facility]. 3. Dr. Antony Contras, Neurology in 6 weeks.  Ambulatory referral sent. 4. Aspirin and Plavix for 3 weeks then Plavix alone. 5. Patient will need prescriptions for medications that were newly started in the hospital or changed at rehab (aspirin, atorvastatin, Plavix, torsemide) 6. Follow blood pressure closely and adjust antihypertensives as needed. 7. Recommend repeating labs (CBC & BMP) in 3-4 days.  Home Health: N/A Equipment/Devices: N/A    Discharge Condition: Improved and stable. CODE STATUS: Full. Diet recommendation: Heart healthy & diabetic diet.  Discharge Diagnoses:  Principal Problem:   Acute ischemic stroke [facility]) Active Problems:   Hypertension associated with diabetes ([place])   Hyperlipidemia associated with type 2 diabetes mellitus ([place])   CKD stage 3 due to type 2 diabetes mellitus ([place])   Diabetes mellitus with neurological manifestations, uncontrolled (HCC)   OSA on CPAP   Pacemaker   HOCM (hypertrophic obstructive cardiomyopathy) (HCC)   IDDM (insulin dependent diabetes mellitus) - on insulin pump   Agitation   Restless   Diastolic dysfunction   Coronary artery disease involving native coronary artery of native heart without angina pectoris   Cardiac pacemaker in situ   Stage 3 chronic kidney disease (HCC)   Atrial fibrillation with rapid ventricular response (HCC)   Acute blood loss anemia   PVD (peripheral vascular disease) (HCC)   Tachypnea   Brief Summary: 77 year old male with a history of diabetes on insulin pump, hypertension, hyperlipidemia, chronic kidney disease stage III, 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 Found to have embolic right MCA distribution infarct. Seen by stroke service, stroke workup was completed and recommendations for dual antiplatelet therapy.  Seen by physical therapy recommended CIR.  On 4/20 patient was using the restroom and became lethargic and hypotensive with blood pressure in the 60-70s. He became unresponsive and staff could not palpate a pulse. He received one dose of atropine and 1 min of chest compressions. He did not require intubation. Suspect hypotension/orthostatic hypotension related to volume depletion. Started on IV fluids. Critical care consulted. EKG showed no changes.  Improved.  CCM signed off 4/21.   Assessment & Plan:   1. Acute ischemic stroke.  Patient found to have right embolic right frontal MCA branch infarct, felt to be embolic secondary to cryptogenic source.  Neurology/stroke service consulted and completed stroke workup (CT head: No acute stroke.  Small vessel disease.  MRI brain: 2 small right frontal cortical infarcts.  Carotid Doppler: Right okay, left ended early as patient combative.  TTE: LVEF 60-65% and grade 1 diastolic dysfunction, LDL 106, A1c 7.6). Transesophageal echocardiogram did not show any cardiac source of embolism.  Recommendations per Neurology are for dual antiplatelet therapy of aspirin 325 mg daily and Plavix 75 mg daily for 3 weeks followed by Plavix alone.  Unfortunately patient had not been getting Plavix until today.  I discussed with Stroke MD on call today and confirmed above DAPT and started Plavix. He was seen by physical therapy and occupational therapy who recommended CIR placement. LDL above goal at 106 and he is been started on Lipitor.  A1c is 7.6.  Per cardiology notes, pacemaker was interrogated and did not show any new atrial fibrillation.   Discussed with rehab team and able to  take today for inpatient rehab.  Stable for discharge.  Outpatient follow-up with neurology in 6 weeks, ambulatory referral sent. 2. Hyperlipidemia.  LDL of 106.  Started on Lipitor.  LDL goal <70.  Continue fish oil and Vascepa. 3. Chronic kidney disease stage III due to diabetes.  Renal function is stable.  Creatinine is near baseline.  Periodically monitor BMP. 4. Type II DM: A1c of 7.6.    On insulin pump and Victoza at home, continue same but will need to improve control, defer to PCP.  Was on Lantus, mealtime NovoLog and SSI in the hospital.  A1c goal <7. 5. Essential hypertension.  On multiple medications including amlodipine, lisinopril, Imdur, Coreg, torsemide.  After syncopal event associated with hypotension and brief cardiac arrest, all of these were held.  Briefly on IV fluids, now discontinued.  Blood pressures starting to rise.  11 beat NSVT noted on telemetry this morning.  Discussed with Cardiologist on call 4/21 and recommended starting carvedilol at reduced dose of 12.5 mg twice daily and titrate up as tolerated.  Patient tolerated carvedilol.  Blood pressures continuing to rise.  Increased carvedilol back to home dose of 25 mg twice daily, at discharge resumed home dose of amlodipine 10 mg daily, Imdur 60 mg daily.  Held doxazosin and lisinopril at discharge to avoid hypotension in patient with hypertrophic cardiomyopathy and risk for recurrent syncope.  However if his blood pressures continue to remain elevated may consider initiating these one at a time.  May also consider consulting his primary cardiologist. 6. Chronic diastolic CHF: Torsemide was reinitiated at half the prior dose to 10 mg twice daily.  Reassess volume status closely as outpatient.  Avoid overdiuresis. 7. Obstructive sleep apnea.  Continue on CPAP 8. Obesity.  BMI 34.9.  Counseled on the importance of diet and exercise. 9. Hypertrophic cardiomyopathy status post pacemaker.  Avoid hypotension or over diuresis.  Resumed  carvedilol at reduced dose. 10. Syncope. Patient had a syncopal event 4/20 with associated hypotension. Blood pressure noted to be in the 60s-70s. Staff could not palpate pulse and he received chest compressions/atropine for 1 min. Suspect that he became hypotensive due to volume depletion leading to syncope. EKG done and clear pacer spikes noted. No significant change from prior EKGs.  Telemetry did not show any arrhythmias coinciding with the event.  Minimally elevated troponin with flat trend likely insignificant and from demand ischemia/chronic kidney disease. All antihypertensives were briefly held held.  Briefly placed on IV fluids.  Transferred to stepdown.  CCM was consulted.  Clinically improved.  IV fluids discontinued. Avoid over diuresis or hypotension.  No recurrence of events even with ambulation. 11. NSVT: Noted 11 beat NSVT on telemetry on 09/03/17 at 5:54 AM.  Asymptomatic.  Recent TTE and TEE with normal EF.  Potassium 4.2 and magnesium 2.  Discussed with Cardiologist on call 4/21 and will resume Carvedilol as indicated above.  No recurrence. 12. Normocytic anemia: Unclear etiology.?  Chronic disease.  Stable.  Periodically follow CBCs as outpatient.   Consultants:   Neurology  Cardiology  CCM  Procedures:   Transesophageal echocardiogram.  No source of cardiac embolus or PFO   Rest as above.   Discharge Instructions  Discharge Instructions    (HEART FAILURE PATIENTS) Call MD:  Anytime you have any of the following symptoms: 1) 3 pound weight gain in 24  hours or 5 pounds in 1 week 2) shortness of breath, with or without a dry hacking cough 3) swelling in the hands, feet or stomach 4) if you have to sleep on extra pillows at night in order to breathe.   Complete by:  As directed    Ambulatory referral to Neurology   Complete by:  As directed    An appointment is requested in approximately: 6 weeks.   Call MD for:   Complete by:  As directed    Strokelike symptoms.   Passing out.   Call MD for:  difficulty breathing, headache or visual disturbances   Complete by:  As directed    Call MD for:  extreme fatigue   Complete by:  As directed    Call MD for:  persistant dizziness or light-headedness   Complete by:  As directed    Diet - low sodium heart healthy   Complete by:  As directed    Diet Carb Modified   Complete by:  As directed    Increase activity slowly   Complete by:  As directed        Medication List    STOP taking these medications   aspirin EC 81 MG tablet Replaced by:  aspirin 325 MG tablet   doxazosin 4 MG tablet Commonly known as:  CARDURA   lisinopril 5 MG tablet Commonly known as:  PRINIVIL,ZESTRIL     TAKE these medications   amLODipine 10 MG tablet Commonly known as:  NORVASC Take 1 tablet (10 mg total) by mouth daily.   aspirin 325 MG tablet Take 1 tablet (325 mg total) by mouth daily. Start taking on:  09/05/2017 Replaces:  aspirin EC 81 MG tablet   atorvastatin 10 MG tablet Commonly known as:  LIPITOR Take 1 tablet (10 mg total) by mouth daily at 6 PM.   calcitRIOL 0.25 MCG capsule Commonly known as:  ROCALTROL Take 0.25 mcg by mouth daily.   carvedilol 25 MG tablet Commonly known as:  COREG Take 1 tablet (25 mg total) by mouth 2 (two) times daily with a meal.   cholecalciferol 1000 units tablet Commonly known as:  VITAMIN D Take 1 tablet (1,000 Units total) by mouth daily. What changed:  when to take this   clopidogrel 75 MG tablet Commonly known as:  PLAVIX Take 1 tablet (75 mg total) by mouth daily.   Fish Oil 1000 MG Caps Take by mouth 2 (two) times daily.   hydrOXYzine 50 MG tablet Commonly known as:  ATARAX/VISTARIL Take 1 tablet (50 mg total) 3 (three) times daily as needed by mouth.   Icosapent Ethyl 1 g Caps Commonly known as:  VASCEPA 2 caps bid   insulin regular human CONCENTRATED 500 UNIT/ML injection Commonly known as:  HUMULIN R USE UP TO 0.5 ML IN INSULIN PUMP daily. What  changed:  Another medication with the same name was removed. Continue taking this medication, and follow the directions you see here.   isosorbide mononitrate 30 MG 24 hr tablet Commonly known as:  IMDUR TAKE TWO TABLETS BY MOUTH ONCE DAILY   liraglutide 18 MG/3ML Sopn Commonly known as:  VICTOZA INJECT 0.3MLS (1.8 MG TOTAL) INTO THE SKIN DAILY   pantoprazole 40 MG tablet Commonly known as:  PROTONIX TAKE ONE TABLET BY MOUTH ONCE DAILY   sertraline 100 MG tablet Commonly known as:  ZOLOFT Take 100 mg by mouth daily.   torsemide 20 MG tablet Commonly known as:  DEMADEX Take 0.5 tablets (  10 mg total) by mouth 2 (two) times daily. What changed:  how much to take   traMADol 50 MG tablet Commonly known as:  ULTRAM Take 1-2 tablets (50-100 mg total) by mouth every 6 (six) hours as needed for moderate pain or severe pain.   vitamin B-12 100 MCG tablet Commonly known as:  CYANOCOBALAMIN Take 100 mcg by mouth daily.      Follow-up Information    [name], MD. Schedule an appointment as soon as possible for a visit.   Specialty:  Family Medicine Why:  Upon discharge from CIR. Contact information: [place] [address] [phone]        [name], MD. Schedule an appointment as soon as possible for a visit.   Specialty:  Cardiology Why:  Upon discharge from CIR. Contact information: [address] [place] [address] [phone]        Garvin Fila, MD. Schedule an appointment as soon as possible for a visit in 6 week(s).   Specialties:  Neurology, Radiology Contact information: [address] [place] [address] [phone]          Allergies  Allergen Reactions  . Adhesive [Tape]     blisters      Procedures/Studies: Dg Chest 2 View  Result Date: 08/31/2017 CLINICAL DATA:  Acute ischemic stroke EXAM: CHEST - 2 VIEW COMPARISON:  06/29/2017 FINDINGS: Stable cardiomegaly with aortic  atherosclerosis. Left-sided pacemaker apparatus projects over the left shoulder with leads in the right atrium and right ventricle unchanged in appearance. No overt pulmonary edema. Hazy appearance of the left lung base noted likely due to differential soft tissues overlying the lung bases and left basilar atelectasis. No effusion is seen on the lateral view. There is no pneumothorax. Degenerative changes are present along the dorsal spine. IMPRESSION: Cardiomegaly with aortic atherosclerosis. No active pulmonary disease. Electronically Signed   By: Ashley Royalty [name]   On: 08/31/2017 01:31   Ct Head Wo Contrast  Result Date: 08/30/2017 CLINICAL DATA:  LEFT-sided facial droop. LEFT arm numbness. EXAM: CT HEAD WITHOUT CONTRAST TECHNIQUE: Contiguous axial images were obtained from the base of the skull through the vertex without intravenous contrast. COMPARISON:  Head CT 09/26/2016 FINDINGS: Brain: No acute intracranial hemorrhage. No focal mass lesion. No CT evidence of acute infarction. No midline shift or mass effect. No hydrocephalus. Basilar cisterns are patent. There are periventricular and subcortical white matter hypodensities. Generalized cortical atrophy. Vascular: No hyperdense vessel or unexpected calcification. Skull: Normal. Negative for fracture or focal lesion. Sinuses/Orbits: Paranasal sinuses and mastoid air cells are clear. Orbits are clear. Other: None. IMPRESSION: 1. No acute intracranial findings. 2. Atrophy and white matter microvascular disease. Electronically Signed   By: Suzy Bouchard [name]   On: 08/30/2017 21:39   Mr Brain Wo Contrast  Addendum Date: 08/31/2017   ADDENDUM REPORT: 08/31/2017 13:28 ADDENDUM: After further review, there are 2 small areas of restricted diffusion in the right frontal cortex posteriorly compatible with acute infarct. Electronically Signed   By: Franchot Gallo [name]   On: 08/31/2017 13:28   Result Date: 08/31/2017 CLINICAL DATA:  Focal neuro deficit.  Confusion. Left-sided weakness. EXAM: MRI HEAD WITHOUT CONTRAST TECHNIQUE: Multiplanar, multiecho pulse sequences of the brain and surrounding structures were obtained without intravenous contrast. COMPARISON:  CT head 08/30/2017 FINDINGS: Brain: Incomplete study. Diagnostic diffusion-weighted imaging, mildly degraded axial T2 imaging. The patient was not able to complete the study. Negative for acute  infarct. Mild atrophy. No focal ischemic changes. Negative for mass or edema. Vascular: Normal arterial flow voids Skull and upper cervical spine: Negative Sinuses/Orbits: Mild mucosal edema paranasal sinuses. Bilateral cataract surgery Other: None IMPRESSION: No acute abnormality The patient was  not able to complete the study Electronically Signed: By: Franchot Gallo [name] On: 08/31/2017 10:41   Dg Chest Port 1 View  Result Date: 09/02/2017 CLINICAL DATA:  Shortness of breath for 1 day.  Syncopal episode. EXAM: PORTABLE CHEST 1 VIEW COMPARISON:  CT chest 07/23/2015. PA and lateral chest 08/31/2017 and 06/29/2017. FINDINGS: Pacing device is unchanged. Heart size is upper normal. Aortic atherosclerosis is seen. The lungs appear hyperexpanded but are clear. Prominent epicardial fat at the left cardiophrenic angle noted. No acute bony abnormality. IMPRESSION: No acute disease. Atherosclerosis. Pulmonary hyperexpansion suggestive of emphysema. Electronically Signed   By: Inge Rise [name]   On: 09/02/2017 14:35      Subjective: Patient denies complaints.  States that he ambulated a couple times in the hallway yesterday without dizziness, lightheadedness, chest pain, dyspnea, palpitations.  Denies any further strokelike symptoms.  As per RN, no acute issues noted.  Discharge Exam:  Vitals:   09/03/17 2046 09/04/17 0035 09/04/17 0800 09/04/17 1138  BP: (!) 175/61 (!) 164/60 (!) 170/63 (!) 154/60  Pulse: 65 60 89 68  Resp: 13 (!) 21 (!) 32 (!) 24  Temp:  99 F (37.2 C) 98.8 F (37.1 C) 99.3 F (37.4  C)  TempSrc:  Oral Oral Oral  SpO2: 98% 97% 98% 97%  Weight:      Height:        General exam: Pleasant elderly male, moderately built and obese lying comfortably propped up in bed.  Oral mucosa moist. Respiratory system: Clear to auscultation. Respiratory effort normal.   Cardiovascular system: S1 and S2 heard, RRR.  No JVD, murmurs or pedal edema.  Telemetry personally reviewed: A paced rhythm.  11 beat NSVT noted on telemetry 4/21 at 5:54 AM.  No further recurrence of NSVT since 4/21. Gastrointestinal system: Abdomen is nondistended, soft and nontender. No organomegaly or masses felt. Normal bowel sounds heard.  Central nervous system: Alert and oriented x4.  No focal neurological deficits. Extremities: No C/C/E, +pedal pulses.  Symmetric 5 x 5 power. Skin: No rashes, lesions or ulcers Psychiatry: Judgment and insight intact.  Affect pleasant and appropriate.     The results of significant diagnostics from this hospitalization (including imaging, microbiology, ancillary and laboratory) are listed below for reference.     Microbiology: Recent Results (from the past 240 hour(s))  MRSA PCR Screening     Status: None   Collection Time: 09/02/17  6:35 AM  Result Value Ref Range Status   MRSA by PCR NEGATIVE NEGATIVE Final    Comment:        The GeneXpert MRSA Assay (FDA approved for NASAL specimens only), is one component of a comprehensive MRSA colonization surveillance program. It is not intended to diagnose MRSA infection nor to guide or monitor treatment for MRSA infections. Performed at [facility], [place] [address] [place], [place] [address]      Labs: CBC: Recent Labs  Lab 08/30/17 2057 09/02/17 1209 09/03/17 0030  WBC 8.6 7.1 7.2  NEUTROABS 6.0  --   --   HGB 11.5* 11.2* 11.0*  HCT 34.8* 33.6* 33.9*  MCV 91.1 92.1 91.1  PLT 190 159 086   Basic Metabolic Panel: Recent Labs  Lab 08/30/17 2057 09/02/17 1209 09/02/17  1423 09/03/17 0030  NA 139  136  --  138  K 4.7 4.7  --  4.2  CL 108 104  --  106  CO2 23 20*  --  22  GLUCOSE 136* 248*  --  147*  BUN 33* 51*  --  52*  CREATININE 1.95* 2.41*  --  2.39*  CALCIUM 9.5 8.5*  --  8.4*  MG  --   --  2.1 2.0   Cardiac Enzymes: Recent Labs  Lab 09/02/17 1209 09/02/17 1734 09/03/17 0030  TROPONINI 0.04* 0.07* 0.04*   CBG: Recent Labs  Lab 09/03/17 1210 09/03/17 1716 09/03/17 2147 09/04/17 0816 09/04/17 1144  GLUCAP 220* 146* 138* 134* 199*   Urinalysis    Component Value Date/Time   COLORURINE STRAW (A) 08/30/2017 2206   APPEARANCEUR CLEAR 08/30/2017 2206   LABSPEC 1.010 08/30/2017 2206   PHURINE 5.0 08/30/2017 2206   GLUCOSEU NEGATIVE 08/30/2017 2206   GLUCOSEU NEGATIVE 03/03/2015 1501   HGBUR SMALL (A) 08/30/2017 2206   BILIRUBINUR NEGATIVE 08/30/2017 2206   KETONESUR NEGATIVE 08/30/2017 2206   PROTEINUR 100 (A) 08/30/2017 2206   UROBILINOGEN 0.2 03/03/2015 1501   NITRITE NEGATIVE 08/30/2017 2206   LEUKOCYTESUR NEGATIVE 08/30/2017 2206    I discussed with spouse, updated care and answered questions.  Time coordinating discharge: 45 minutes  SIGNED:  Vernell Leep, MD, FACP, [facility]. [facility] Pager [phone]  If 7PM-7AM, please contact night-coverage [email or web address] Password [facility] 09/04/2017, 12:30 PM

## 2017-09-04 NOTE — Progress Notes (Signed)
Pt has home CPAP within reach @ beside.  RT added sterile water.

## 2017-09-04 NOTE — PMR Pre-admission (Signed)
PMR Admission Coordinator Pre-Admission Assessment  Patient: David [name] is an 77 y.o., male MRN: [id number] DOB: [birth year] Height: 6\' 4"  (193 cm) Weight: 130.2 kg (287 lb)              Insurance Information HMO: No   PPO:       PCP:       IPA:       80/20:       OTHER:   PRIMARY: Medicare A and B      Policy#: [id number]      Subscriber: patient CM Name:        Phone#:       Fax#:   Pre-Cert#:        Employer: Retired Benefits:  Phone #:       Name: Checked in [place]. Date: A=08/14/05 and B=01/14/06   Deduct: $1384      Out of Pocket Max: None      Life Max: N/A CIR: 100%      SNF: 100 days Outpatient: 80%     Co-Pay: 20% Home Health: 1005      Co-Pay: none DME: 80%     Co-Pay: 20% Providers: patient's choice  SECONDARY:  [employer]      Policy#: [id number]      Subscriber:  patient CM Name:        Phone#:       Fax#:   Pre-Cert#:        Employer: Retired Benefits:  Phone #:  [phone]     Name:   Eff. Date:       Deduct:        Out of Pocket Max:        Life Max:   CIR:        SNF:   Outpatient:       Co-Pay:   Home Health:        Co-Pay:   DME:       Co-Pay:    Emergency Contact Information Contact Information    Name Relation Home Work [place] Spouse [phone]   Teoman, Giraud [phone]  [phone]     Current Medical History  Patient Admitting Diagnosis: Right frontal lobe infarcts  History of Present Illness:  A 77 y.o. right-handed male with history of CAD/pacemaker maintained on aspirin, diastolic congestive heart failure, CKD stage III, chronic right foot ulcer, diabetes mellitus with insulin pump, OSA on CPAP, PVD with right second toe amputation.  Per chart review patient lives with spouse.  Presented 08/31/2017 with left-sided weakness times 2 days.  Cranial CT reviewed, unremarkable for acute intracranial process.  Patient did not receive TPA.  MRI of the brain showing right frontal  infarcts.  Echocardiogram with ejection fraction of 70% grade 1 diastolic dysfunction.  Neurology follow-up with workup ongoing.  Patient with ongoing bouts of agitation and restlessness, with patient swinging at staff nursing, and a sitter was provided for patient safety.  Presently on aspirin for CVA prophylaxis.  Subcutaneous Lovenox for DVT prophylaxis.  Physical and occupational therapy evaluations are pending.  MD has requested physical medicine rehab consult.   Total: 0=NIH  Past Medical History  Past Medical History:  Diagnosis Date  . Anemia, iron deficiency   . Anxiety   . Arthritis   . BPH (benign prostatic hypertrophy)   . CAD (coronary artery disease)    Nonobstructive CAD per cath  . Cardiac pacemaker in situ   .  CHF (congestive heart failure) ([place])   . Chronic ulcer of right foot ([place])   . CKD (chronic kidney disease), stage III ([place]) secondary to DM and HTN   nephrologist-  [name]  . Dyspnea   . History of cellulitis    right great toe 10-25-2014  . History of skin cancer   . HOCM (hypertrophic obstructive cardiomyopathy) ([place])   . Hypertension   . 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 Insulin pump in place   . OSA on CPAP   . Peripheral neuropathy    severe  . Peripheral vascular disease ([place])    bilateral lower extremities  . Rib fracture 07/24/2015  . Secondary hyperparathyroidism of renal origin ([place])   . Sinus node dysfunction ([facility])   . Sleep apnea     Family History  family history includes Cancer in his mother; Heart attack in his father.  Prior Rehab/Hospitalizations:  Has the patient had major surgery during 100 days prior to admission? No  Current Medications   Current Facility-Administered Medications:  .  acetaminophen (TYLENOL) tablet 650 mg, 650 mg, Oral, Q4H PRN **OR** acetaminophen (TYLENOL) solution 650 mg, 650 mg, Per Tube, Q4H PRN **OR** acetaminophen (TYLENOL) suppository  650 mg, 650 mg, Rectal, Q4H PRN, Memon, Jehanzeb, MD .  aspirin suppository 300 mg, 300 mg, Rectal, Daily **OR** aspirin tablet 325 mg, 325 mg, Oral, Daily, Memon, Jehanzeb, MD, 325 mg at 09/04/17 1021 .  atorvastatin (LIPITOR) tablet 10 mg, 10 mg, Oral, q1800, Kathie Dike, MD, 10 mg at 09/03/17 1807 .  calcitRIOL (ROCALTROL) capsule 0.25 mcg, 0.25 mcg, Oral, Daily, Memon, Jehanzeb, MD, 0.25 mcg at 09/04/17 1021 .  carvedilol (COREG) tablet 25 mg, 25 mg, Oral, BID WC, Hongalgi, Anand D, MD, 25 mg at 09/04/17 0809 .  cholecalciferol (VITAMIN D) tablet 1,000 Units, 1,000 Units, Oral, QPM, Kathie Dike, MD, 1,000 Units at 09/03/17 1807 .  enoxaparin (LOVENOX) injection 40 mg, 40 mg, Subcutaneous, Q24H, Memon, Jehanzeb, MD, 40 mg at 09/03/17 1806 .  hydrOXYzine (ATARAX/VISTARIL) tablet 50 mg, 50 mg, Oral, TID PRN, Kathie Dike, MD, 50 mg at 09/03/17 2152 .  insulin aspart (novoLOG) injection 0-15 Units, 0-15 Units, Subcutaneous, TID WC, Kathie Dike, MD, 2 Units at 09/04/17 0817 .  insulin aspart (novoLOG) injection 4 Units, 4 Units, Subcutaneous, TID WC, Kathie Dike, MD, 4 Units at 09/04/17 0813 .  insulin glargine (LANTUS) injection 15 Units, 15 Units, Subcutaneous, BID, Kathie Dike, MD, 15 Units at 09/04/17 1027 .  isosorbide mononitrate (IMDUR) 24 hr tablet 30 mg, 30 mg, Oral, Daily, Hongalgi, Anand D, MD, 30 mg at 09/04/17 1031 .  pantoprazole (PROTONIX) EC tablet 40 mg, 40 mg, Oral, Daily, Memon, Jehanzeb, MD, 40 mg at 09/04/17 1020 .  senna-docusate (Senokot-S) tablet 1 tablet, 1 tablet, Oral, QHS PRN, Kathie Dike, MD, 1 tablet at 09/04/17 0810 .  sertraline (ZOLOFT) tablet 100 mg, 100 mg, Oral, Daily, Memon, Jehanzeb, MD, 100 mg at 09/04/17 1020 .  torsemide (DEMADEX) tablet 10 mg, 10 mg, Oral, BID, Hongalgi, Anand D, MD, 10 mg at 09/04/17 0810 .  traMADol (ULTRAM) tablet 50-100 mg, 50-100 mg, Oral, Q6H PRN, Kathie Dike, MD .  vitamin B-12 (CYANOCOBALAMIN) tablet 100  mcg, 100 mcg, Oral, Daily, Memon, Jehanzeb, MD, 100 mcg at 09/04/17 1021  Patients Current Diet: Fall precautions Diet heart healthy/carb modified Room service appropriate? Yes; Fluid consistency: Thin  Precautions / Restrictions Precautions Precautions: Fall  Restrictions Weight Bearing Restrictions: No   Has the patient had 2 or more falls or a fall with injury in the past year?Yes.  Daughter reports 2 falls in the past year  Prior Activity Level Community (5-7x/wk): Went out daily.  Was driving.  Picked up children in the afternoons.  Home Assistive Devices / Equipment Home Assistive Devices/Equipment: Eyeglasses Home Equipment: Environmental consultant - 2 wheels, Cane - single point, Civil engineer, contracting  Prior Device Use: Indicate devices/aids used by the patient prior to current illness, exacerbation or injury? Cane when outside of home  Prior Functional Level Prior Function Level of Independence: Independent with assistive device(s) Comments: used cane and furniture walked; drove but wife states it was "scarey"  Self Care: Did the patient need help bathing, dressing, using the toilet or eating?  Independent  Indoor Mobility: Did the patient need assistance with walking from room to room (with or without device)? Independent  Stairs: Did the patient need assistance with internal or external stairs (with or without device)? Independent  Functional Cognition: Did the patient need help planning regular tasks such as shopping or remembering to take medications? Independent  Current Functional Level Cognition  Arousal/Alertness: Awake/alert Overall Cognitive Status: Impaired/Different from baseline Current Attention Level: Sustained Orientation Level: Oriented X4 Following Commands: Follows one step commands inconsistently Safety/Judgement: Decreased awareness of safety, Decreased awareness of deficits General Comments: cognition most likely impacted by medicatioin but is impaired. will further  assess Attention: Focused, Sustained, Selective Focused Attention: Appears intact Sustained Attention: Appears intact Selective Attention: Impaired Selective Attention Impairment: Functional basic, Verbal basic Memory: Impaired Memory Impairment: Storage deficit, Decreased recall of new information(delayed recall 2/5) Awareness: Impaired Awareness Impairment: Emergent impairment Problem Solving: Impaired Problem Solving Impairment: Functional complex Executive Function: Organizing, Sequencing Sequencing: Impaired Sequencing Impairment: Functional complex, Verbal complex Organizing: Impaired Organizing Impairment: Functional basic(clock drawing impaired) Safety/Judgment: Impaired    Extremity Assessment (includes Sensation/Coordination)  Upper Extremity Assessment: Defer to OT evaluation LUE Deficits / Details: general weakness; appears weaker distally; poor in hand manipulatioin skills; states hand feels "numb"; "alien arm " type movements during functional tasks; unaware of L hand falling off RW LUE Sensation: decreased light touch, decreased proprioception LUE Coordination: decreased fine motor, decreased gross motor  Lower Extremity Assessment: RLE deficits/detail, LLE deficits/detail RLE Deficits / Details: R toe amputations at baseline.  RLE Sensation: history of peripheral neuropathy LLE Deficits / Details: Generalized weakness as compared to RLE noted.  LLE Sensation: history of peripheral neuropathy    ADLs  Overall ADL's : Needs assistance/impaired Eating/Feeding: Set up, Supervision/ safety, Sitting Grooming: Minimal assistance, Sitting Upper Body Bathing: Minimal assistance, Sitting Lower Body Bathing: Moderate assistance, Sit to/from stand Upper Body Dressing : Moderate assistance, Sitting Lower Body Dressing: Maximal assistance, Sit to/from stand Toilet Transfer: RW, Moderate assistance Toileting- Clothing Manipulation and Hygiene: Moderate assistance Toileting -  Clothing Manipulation Details (indicate cue type and reason): began to urinate prior to placement of urinal Functional mobility during ADLs: Moderate assistance, +2 for physical assistance, Rolling walker, Cueing for safety, Cueing for sequencing(limited)    Mobility  Overal bed mobility: Needs Assistance Bed Mobility: Supine to Sit, Sit to Supine Supine to sit: Min assist Sit to supine: Min guard General bed mobility comments: initially posterior lean    Transfers  Overall transfer level: Needs assistance Equipment used: Rolling walker (2 wheeled) Transfers: Sit to/from Stand Sit to Stand: Mod assist, +2 physical assistance General transfer comment: poor use of RW; no knee buskling noted; poor proprioception of LLE;  hx of peripheral neuropathy; did not follow commands for safe use of RW. Performed X 2, as pt abruptly sitting on first attempt.     Ambulation / Gait / Stairs / Wheelchair Mobility  Ambulation/Gait General Gait Details: Pt with poor sequencing and unable to follow commands to take side steps at EOB. Pt with decreased awareness of RUE and unable to maintain placement on RW.     Posture / Balance Balance Overall balance assessment: Needs assistance Sitting-balance support: No upper extremity supported, Feet supported Sitting balance-Leahy Scale: Fair Postural control: Posterior lean Standing balance support: Bilateral upper extremity supported, During functional activity Standing balance-Leahy Scale: Poor Standing balance comment: Reliant on BUE support and external support.     Special needs/care consideration BiPAP/CPAP Yes, CPAP at bedside CPM No Continuous Drip IV No Dialysis No         Life Vest No Oxygen No Special Bed No Trach Size No Wound Vac (area) No       Skin Has had toe amputations in the past, so needs careful foot care.                           Bowel mgmt: Last BM 09/04/17 per family Bladder mgmt: Voiding in urinal Diabetic mgmt Yes, on insulin at  home    Previous Home Environment Living Arrangements: Spouse/significant other  Lives With: Spouse Available Help at Discharge: Family, Available 24 hours/day Type of Home: House Home Layout: One level Home Access: Stairs to enter Entrance Stairs-Rails: Right Entrance Stairs-Number of Steps: 1 Bathroom Shower/Tub: Tub/shower unit, Architectural technologist: Standard Bathroom Accessibility: Yes How Accessible: Accessible via walker [place]: No  Discharge Living Setting Plans for Discharge Living Setting: Patient's home, House, Lives with (comment)(Lives with wife.) Type of Home at Discharge: Other (Comment)(Townhouse) Discharge Home Layout: One level Discharge Home Access: Stairs to enter Entrance Stairs-Number of Steps: 1 step entry Does the patient have any problems obtaining your medications?: No  Social/Family/Support Systems Patient Roles: Spouse, Parent(Has a wife, a son and 2 daughters.) Contact Information: Ezri Landers - spouse Anticipated Caregiver: Wife, daughters Anticipated Caregiver's Contact Information: [name] - wife - [phone] Ability/Limitations of Caregiver: Wife can provide supervision, she is limited with physical assistance.  Daughters can also assist some. Caregiver Availability: 24/7 Discharge Plan Discussed with Primary Caregiver: Yes Is Caregiver In Agreement with Plan?: Yes Does Caregiver/Family have Issues with Lodging/Transportation while Pt is in Rehab?: No  Goals/Additional Needs Patient/Family Goal for Rehab: PT/OT supervision, SLP supervision to min assist goals Expected length of stay: 12-16 days Cultural Considerations: [employer]an Dietary Needs: Heart healthy, carb mod, thin liquids Equipment Needs: TBD Pt/Family Agrees to Admission and willing to participate: Yes Program Orientation Provided & Reviewed with Pt/Caregiver Including Roles  & Responsibilities: Yes  Decrease burden of Care through IP rehab admission:  N/A  Possible need for SNF placement upon discharge: Not anticipated  Patient Condition: This patient's medical and functional status has changed since the consult dated: 08/1917 in which the Rehabilitation Physician determined and documented that the patient's condition is appropriate for intensive rehabilitative care in an inpatient rehabilitation facility. See "History of Present Illness" (above) for medical update. Functional changes are:  Currently requiring mod assist +2 for transfers. Patient's medical and functional status update has been discussed with the Rehabilitation physician and patient remains appropriate for inpatient rehabilitation. Will admit to inpatient rehab today.  Preadmission Screen Completed By:  Retta Diones, 09/04/2017 11:42 AM  ______________________________________________________________________   Discussed status with Dr. Naaman Plummer on 09/04/17 at 1140 and received telephone approval for admission today.  Admission Coordinator:  Retta Diones, time 1140/Date 09/04/17

## 2017-09-04 NOTE — Progress Notes (Signed)
Patient received at 1500 alert and oriented x4. No complaint of pain. Received in w/c. Patient and family oriented to room and call bell system. Hx and physical assessment reviewed. Safety protocol reviewed with patient and family. Patient and family verbalized understanding of admission process. Continue with plan of care.  Mliss Sax

## 2017-09-04 NOTE — H&P (Signed)
Physical Medicine and Rehabilitation Admission H&P    Chief Complaint  Patient presents with  . Stroke with functional deficits.     HPI: David [name] is a 77 year old right handed male with history of type 2 diabetes mellitus with neuropathy, CAD, HCM s/p PPM, CKD stage III, age-related macular degeneration who was admitted on 08/31/2016 with confusion, left sided numbness and weakness as well as speech difficulty.  CT of head was negative for acute changes.  MRI of brain done revealing 2 small areas of restricted diffusion in the right frontal cortex compatible with acute infarct.  2D echo done showing EF of 60-65% with trivial pericardial effusion.  Cardiology was consulted for input and he underwent TEE revealing normal LVEF, no PFO, no thrombus and fixed plaque in the thoracic aorta. Stroke felt to be embolic due to unknonw source and ASA and Plavix recommended X 3 weeks followed by Plavix alone. Patient o follow up with Dr. Leonie Man and Dr. Caryl Comes after discharge.   He had episode of syncope with fall on 4/20 and lack of pulse. Code initiated with CPR X 1 minute and dose of atropine with return of pulse and back to baseline. PPM interrogated and no A fib noted--syncope fell to be due to hypotension and medications held. He has had few beats of asymptomatic NSVT 4/21 and coreg resumed at lower dose. Low dose torsemide resumed today with recommendations to  Monitor BP. Bouts of agitation resolving and family providing supervision for safety? Therapy ongoing with cognitive deficits and weakness affecting ADLs as well as mobility. CIR recommended due to functional deficits.    Review of Systems  Constitutional: Negative for chills and fever.  HENT: Negative for hearing loss and tinnitus.   Respiratory: Negative for cough and shortness of breath.   Cardiovascular: Negative for chest pain and palpitations.  Gastrointestinal: Negative for constipation, heartburn and nausea.  Genitourinary:  Negative for dysuria and urgency.  Musculoskeletal: Positive for joint pain (chronic left knee pain).  Skin: Negative for rash.  Neurological: Positive for speech change and weakness.  Psychiatric/Behavioral: Positive for memory loss. The patient is not nervous/anxious.     Past Medical History:  Diagnosis Date  . Anemia, iron deficiency   . Anxiety   . Arthritis   . BPH (benign prostatic hypertrophy)   . CAD (coronary artery disease)    Nonobstructive CAD per cath  . Cardiac pacemaker in situ   . CHF (congestive heart failure) ([place])   . Chronic ulcer of right foot ([place])   . CKD (chronic kidney disease), stage III ([place]) secondary to DM and HTN   nephrologist-  [name]  . Dyspnea   . History of cellulitis    right great toe 10-25-2014  . History of skin cancer   . HOCM (hypertrophic obstructive cardiomyopathy) ([place])   . Hypertension   . Insulin dependent type 2 diabetes mellitus ([place]) 1991   followd by [name]--  has insulin pump  . Insulin pump in place   . OSA on CPAP   . Peripheral neuropathy    severe  . Peripheral vascular disease ([place])    bilateral lower extremities  . Rib fracture 07/24/2015  . Secondary hyperparathyroidism of renal origin ([place])   . Sinus node dysfunction ([facility])   . Sleep apnea     Past Surgical History:  Procedure Laterality Date  . 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 AMPUTATION TOE Right 03/12/2015  Procedure: RIGHT HALLUS AMPUTATION ;  Surgeon: [name], MD;  Location: [facility];  Service: Podiatry;  Laterality: Right;  . CARDIAC CATHETERIZATION  11-25-2010   [place], [place]   Nonobstructive CAD  . CARDIAC PACEMAKER PLACEMENT  Nov 2009   [employer]  . CHOLECYSTECTOMY N/A 03/05/2017   Procedure: LAPAROSCOPIC CHOLECYSTECTOMY WITH INTRAOPERATIVE CHOLANGIOGRAM;  Surgeon: [name], MD;  Location: [facility];  Service: General;  Laterality: N/A;  . EP IMPLANTABLE DEVICE N/A  06/03/2015   Procedure: PPM Generator Changeout;  Surgeon: [name], MD;  Location: [facility];  Service: Cardiovascular;  Laterality: N/A;  . EXCISION BONE CYST Right 03/06/2015   Procedure: BONE BIOPSIES OF RIGHT FOOT;  Surgeon: [name], MD;  Location: [facility];  Service: Podiatry;  Laterality: Right;  . ORIF ANKLE FRACTURE Left 11/06/2014   Procedure: OPEN REDUCTION INTERNAL FIXATION (ORIF) LEFT  ANKLE FRACTURE;  Surgeon: [name], MD;  Location: [facility];  Service: Orthopedics;  Laterality: Left;  . TEE WITHOUT CARDIOVERSION N/A 09/01/2017   Procedure: TRANSESOPHAGEAL ECHOCARDIOGRAM (TEE);  Surgeon: [name], MD;  Location: [facility];  Service: Cardiovascular;  Laterality: N/A;  . TOTAL KNEE ARTHROPLASTY    . VEIN LIGATION AND STRIPPING      Family History  Problem Relation Age of Onset  . Cancer Mother        breast  . Heart attack Father     Social History:  Married. Retired Chief Financial Officer. Per reports that he quit smoking about 33 years ago. He has a 60.00 pack-year smoking history. He has quit using smokeless tobacco. He reports that he drinks about 0.6 oz of alcohol per week. He reports that he does not use drugs.   Allergies  Allergen Reactions  . Adhesive [Tape]     blisters   Medications Prior to Admission  Medication Sig Dispense Refill  . amLODipine (NORVASC) 10 MG tablet Take 1 tablet (10 mg total) by mouth daily. 90 tablet 3  . aspirin EC 81 MG tablet Take 81 mg by mouth daily.    . calcitRIOL (ROCALTROL) 0.25 MCG capsule Take 0.25 mcg by mouth daily.     . carvedilol (COREG) 25 MG tablet Take 1 tablet (25 mg total) by mouth 2 (two) times daily with a meal. 180 tablet 1  . cholecalciferol (VITAMIN D) 1000 units tablet Take 1 tablet (1,000 Units total) by mouth daily. (Patient taking differently: Take 1,000 Units by mouth every evening. ) 30 tablet 1  . doxazosin (CARDURA) 4 MG tablet TAKE 1 TABLET BY MOUTH ONCE  DAILY 90 tablet 0  . hydrOXYzine (ATARAX/VISTARIL) 50 MG tablet Take 1 tablet (50 mg total) 3 (three) times daily as needed by mouth. 30 tablet 1  . Icosapent Ethyl (VASCEPA) 1 g CAPS 2 caps bid 120 capsule 2  . insulin regular human CONCENTRATED (HUMULIN R) 500 UNIT/ML injection USE UP TO 0.5 ML IN INSULIN PUMP daily. 1 vial 1  . isosorbide mononitrate (IMDUR) 30 MG 24 hr tablet TAKE TWO TABLETS BY MOUTH ONCE DAILY 60 tablet 11  . liraglutide (VICTOZA) 18 MG/3ML SOPN INJECT 0.3MLS (1.8 MG TOTAL) INTO THE SKIN DAILY 9 pen 1  . lisinopril (PRINIVIL,ZESTRIL) 5 MG tablet Take 5 mg by mouth every evening.     . Omega-3 Fatty Acids (FISH OIL) 1000 MG CAPS Take by mouth 2 (two) times daily.    . pantoprazole (PROTONIX) 40 MG tablet TAKE ONE TABLET BY MOUTH ONCE DAILY  90 tablet 1  . sertraline (ZOLOFT) 100 MG tablet Take 100 mg by mouth daily.    . traMADol (ULTRAM) 50 MG tablet Take 1-2 tablets (50-100 mg total) by mouth every 6 (six) hours as needed for moderate pain or severe pain. 30 tablet 0  . vitamin B-12 (CYANOCOBALAMIN) 100 MCG tablet Take 100 mcg by mouth daily.    . [DISCONTINUED] torsemide (DEMADEX) 20 MG tablet Take 20 mg by mouth 2 (two) times daily.     . insulin regular human CONCENTRATED (HUMULIN R) 500 UNIT/ML injection USE 0.25 ML DAILY OR AS DIRECTED IN INSULIN PUMP E11.65 (Patient not taking: Reported on 08/30/2017) 20 mL 4    Drug Regimen Review  Drug regimen was reviewed and remains appropriate with no significant issues identified  Home: Home Living Family/patient expects to be discharged to:: Private residence Living Arrangements: Spouse/significant other Available Help at Discharge: Family, Available 24 hours/day Type of Home: House Home Access: Stairs to enter [employer] of Steps: 1 Entrance Stairs-Rails: Right Home Layout: One level Bathroom Shower/Tub: Tub/shower unit, Architectural technologist: Standard Bathroom Accessibility: Yes Home Equipment: Environmental consultant  - 2 wheels, [place] - single point, Civil engineer, contracting  Lives With: Spouse   Functional History: Prior Function Level of Independence: Independent with assistive device(s) Comments: used cane and furniture walked; drove but wife states it was "scarey"  Functional Status:  Mobility: Bed Mobility Overal bed mobility: Needs Assistance Bed Mobility: Supine to Sit, Sit to Supine Supine to sit: Min assist Sit to supine: Min guard General bed mobility comments: initially posterior lean Transfers Overall transfer level: Needs assistance Equipment used: Rolling walker (2 wheeled) Transfers: Sit to/from Stand Sit to Stand: Mod assist, +2 physical assistance General transfer comment: poor use of RW; no knee buskling noted; poor proprioception of LLE; hx of peripheral neuropathy; did not follow commands for safe use of RW. Performed X 2, as pt abruptly sitting on first attempt.  Ambulation/Gait General Gait Details: Pt with poor sequencing and unable to follow commands to take side steps at EOB. Pt with decreased awareness of RUE and unable to maintain placement on RW.     ADL: ADL Overall ADL's : Needs assistance/impaired Eating/Feeding: Set up, Supervision/ safety, Sitting Grooming: Minimal assistance, Sitting Upper Body Bathing: Minimal assistance, Sitting Lower Body Bathing: Moderate assistance, Sit to/from stand Upper Body Dressing : Moderate assistance, Sitting Lower Body Dressing: Maximal assistance, Sit to/from stand Toilet Transfer: RW, Moderate assistance Toileting- Clothing Manipulation and Hygiene: Moderate assistance Toileting - Clothing Manipulation Details (indicate cue type and reason): began to urinate prior to placement of urinal Functional mobility during ADLs: Moderate assistance, +2 for physical assistance, Rolling walker, Cueing for safety, Cueing for sequencing(limited)  Cognition: Cognition Overall Cognitive Status: Impaired/Different from baseline Arousal/Alertness:  Awake/alert Orientation Level: Oriented X4 Attention: Focused, Sustained, Selective Focused Attention: Appears intact Sustained Attention: Appears intact Selective Attention: Impaired Selective Attention Impairment: Functional basic, Verbal basic Memory: Impaired Memory Impairment: Storage deficit, Decreased recall of new information(delayed recall 2/5) Awareness: Impaired Awareness Impairment: Emergent impairment Problem Solving: Impaired Problem Solving Impairment: Functional complex Executive Function: Organizing, Sequencing Sequencing: Impaired Sequencing Impairment: Functional complex, Verbal complex Organizing: Impaired Organizing Impairment: Functional basic(clock drawing impaired) Safety/Judgment: Impaired Cognition Arousal/Alertness: Lethargic, Suspect due to medications Behavior During Therapy: Flat affect Overall Cognitive Status: Impaired/Different from baseline Area of Impairment: Orientation, Attention, Following commands, Safety/judgement, Awareness, Problem solving Orientation Level: Disoriented to, Time, Situation Current Attention Level: Sustained Following Commands: Follows one step commands inconsistently Safety/Judgement: Decreased awareness of  safety, Decreased awareness of deficits Awareness: Emergent Problem Solving: Slow processing, Decreased initiation, Difficulty sequencing, Requires verbal cues, Requires tactile cues General Comments: cognition most likely impacted by medicatioin but is impaired. will further assess  Physical Exam: Blood pressure (!) 154/60, pulse 68, temperature 99.3 F (37.4 C), temperature source Oral, resp. rate (!) 24, height _0  (1.93 m), weight 130.2 kg (287 lb), SpO2 97 %. Physical Exam  Nursing note and vitals reviewed. Constitutional: He appears well-developed and well-nourished. No distress.  Obese male. Daughter in room to provide supervision.   HENT:  Head: Normocephalic and atraumatic.  Mouth/Throat: Oropharynx is  clear and moist.  Eyes: Pupils are equal, round, and reactive to light. Conjunctivae are normal.  Neck: Normal range of motion. Neck supple.  Cardiovascular: Regular rhythm. Tachycardia present.  Respiratory: Effort normal. No stridor. He has decreased breath sounds.  GI: Soft. Bowel sounds are normal. He exhibits distension. There is no tenderness.  Musculoskeletal: He exhibits edema.  Well healed right 1st and 2nd toe amputation site. BLE with min edema and woody appearing stasis changes.  Left knee with mild effusions, chronic changes, mild crepitus with extension and flexion. Meniscal maneuvers equivocal.   Neurological: He is alert. A cranial nerve deficit is present.  Left ptosis with left facial weakness. Minimal dysarthria. Oriented to self and place. Situation "heart problems" or " fall".  He is able to follow simple two step commands with redirection. 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 4/5 LUE , LLE 3+/5 prox to 4- distally, somewhat limited to knee pain. RUE and RLE 4+/5. Distal sensory loss in both feet  Skin: Skin is warm and dry. He is not diaphoretic.  Psychiatric: He has a normal mood and affect. His speech is normal. He is slowed. Cognition and memory are impaired. He expresses inappropriate judgment.    Results for orders placed or performed during the hospital encounter of 08/30/17 (from the past 48 hour(s))  Blood gas, arterial     Status: Abnormal   Collection Time: 09/02/17 12:50 PM  Result Value Ref Range   FIO2 21.00    pH, Arterial 7.377 7.350 - 7.450   pCO2 arterial 42.9 32.0 - 48.0 mmHg   pO2, Arterial 65.5 (L) 83.0 - 108.0 mmHg   Bicarbonate 24.8 20.0 - 28.0 mmol/L   Acid-Base Excess 0.1 0.0 - 2.0 mmol/L   O2 Saturation 92.2 %   Patient temperature 97.6    Collection site LEFT BRACHIAL    Drawn by [id number]    Sample type ARTERIAL DRAW    Allens test (pass/fail) PASS PASS  Magnesium     Status: None   Collection Time: 09/02/17  2:23 PM  Result Value Ref Range    Magnesium 2.1 1.7 - 2.4 mg/dL    Comment: Performed at [facility], [address], [place], [name] [address]  Glucose, capillary     Status: Abnormal   Collection Time: 09/02/17  5:26 PM  Result Value Ref Range   Glucose-Capillary 202 (H) 65 - 99 mg/dL  Troponin I (q 6hr x 3)     Status: Abnormal   Collection Time: 09/02/17  5:34 PM  Result Value Ref Range   Troponin I 0.07 (HH) <0.03 ng/mL    Comment: CRITICAL VALUE NOTED.  VALUE IS CONSISTENT WITH PREVIOUSLY REPORTED AND CALLED VALUE. Performed at [facility], [place] [address] [place], [name] [address]   Glucose, capillary     Status: Abnormal   Collection Time: 09/02/17  9:56  PM  Result Value Ref Range   Glucose-Capillary 152 (H) 65 - 99 mg/dL  Troponin I (q 6hr x 3)     Status: Abnormal   Collection Time: 09/03/17 12:30 AM  Result Value Ref Range   Troponin I 0.04 (HH) <0.03 ng/mL    Comment: CRITICAL VALUE NOTED.  VALUE IS CONSISTENT WITH PREVIOUSLY REPORTED AND CALLED VALUE. Performed at [facility], [place] [address] [place], [place] [address]   CBC     Status: Abnormal   Collection Time: 09/03/17 12:30 AM  Result Value Ref Range   WBC 7.2 4.0 - 10.5 K/uL   RBC 3.72 (L) 4.22 - 5.81 MIL/uL   Hemoglobin 11.0 (L) 13.0 - 17.0 g/dL   HCT 33.9 (L) 39.0 - 52.0 %   MCV 91.1 78.0 - 100.0 fL   MCH 29.6 26.0 - 34.0 pg   MCHC 32.4 30.0 - 36.0 g/dL   RDW 14.8 11.5 - 15.5 %   Platelets 170 150 - 400 K/uL    Comment: Performed at [facility], [place] [address] [place], [place] [address]  Basic metabolic panel     Status: Abnormal   Collection Time: 09/03/17 12:30 AM  Result Value Ref Range   Sodium 138 135 - 145 mmol/L   Potassium 4.2 3.5 - 5.1 mmol/L   Chloride 106 101 - 111 mmol/L   CO2 22 22 - 32 mmol/L   Glucose, Bld 147 (H) 65 - 99 mg/dL   BUN 52 (H) 6 - 20 mg/dL   Creatinine, Ser 2.39 (H) 0.61 - 1.24 mg/dL   Calcium 8.4 (L) 8.9 - 10.3 mg/dL   GFR calc non Af Amer 25 (L) >60 mL/min   GFR calc Af  Amer 28 (L) >60 mL/min    Comment: (NOTE) The eGFR has been calculated using the CKD EPI equation. This calculation has not been validated in all clinical situations. eGFR's persistently <60 mL/min signify possible Chronic Kidney Disease.    Anion gap 10 5 - 15    Comment: Performed at [facility] [address] [place], [place] [address]  Magnesium     Status: None   Collection Time: 09/03/17 12:30 AM  Result Value Ref Range   Magnesium 2.0 1.7 - 2.4 mg/dL    Comment: Performed at [facility] [address], [place], [name] [address]  Glucose, capillary     Status: Abnormal   Collection Time: 09/03/17  8:34 AM  Result Value Ref Range   Glucose-Capillary 207 (H) 65 - 99 mg/dL  Glucose, capillary     Status: Abnormal   Collection Time: 09/03/17 12:10 PM  Result Value Ref Range   Glucose-Capillary 220 (H) 65 - 99 mg/dL  Glucose, capillary     Status: Abnormal   Collection Time: 09/03/17  5:16 PM  Result Value Ref Range   Glucose-Capillary 146 (H) 65 - 99 mg/dL  Glucose, capillary     Status: Abnormal   Collection Time: 09/03/17  9:47 PM  Result Value Ref Range   Glucose-Capillary 138 (H) 65 - 99 mg/dL  Glucose, capillary     Status: Abnormal   Collection Time: 09/04/17  8:16 AM  Result Value Ref Range   Glucose-Capillary 134 (H) 65 - 99 mg/dL  Glucose, capillary     Status: Abnormal   Collection Time: 09/04/17 11:44 AM  Result Value Ref Range   Glucose-Capillary 199 (H) 65 - 99 mg/dL   Dg Chest Port 1  View  Result Date: 09/02/2017 CLINICAL DATA:  Shortness of breath for 1 day.  Syncopal episode. EXAM: PORTABLE CHEST 1 VIEW COMPARISON:  CT chest 07/23/2015. PA and lateral chest 08/31/2017 and 06/29/2017. FINDINGS: Pacing device is unchanged. Heart size is upper normal. Aortic atherosclerosis is seen. The lungs appear hyperexpanded but are clear. Prominent epicardial fat at the left cardiophrenic angle noted. No acute bony abnormality. IMPRESSION: No acute  disease. Atherosclerosis. Pulmonary hyperexpansion suggestive of emphysema. Electronically Signed   By: Inge Rise [name]   On: 09/02/2017 14:35       Medical Problem List and Plan: 1.  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  DVT Prophylaxis/Anticoagulation: Pharmaceutical: Lovenox 3. Pain Management/OA left knee  -tylenol or tramadol  - prn. Ice  -voltaren gel   -check knee xrays 4. Mood: LCSW to follow for evaluation and support. Mood stable on home dose Zoloft.  5. Neuropsych: This patient is not capable of making decisions on  his own behalf. 6. Skin/Wound Care: routine pressure relief measures.  7. Fluids/Electrolytes/Nutrition: Monitor I/O. Check lytes in am. Offer supplements prn poor intake.  8. HCM/PPM: Recent syncope with lack of pulse---monitor orthostatic vitals. Continue low dose coreg. 9. Chronic diastolic CHF: Heart healthy diet with daily weights. Lisinopril and Cardura on hold to prevent recurrent syncope/hypotension. Back on torsemide. Continue ASA, Lipitor  and coreg.  Monitor for signs of  fluid overload. Will order TEDs to help manage chronic peripheral edema.  10 T2DM with neuropathy and nephropathy:  Was on insulin pump at home but currently not competent to use it. Monitor BS ac/hs and titrate insulin as needed. Continue lantus insulin with meal coverage--wife has been educated by diabetes coordinator and to follow up with Dr. Dwyane Dee after discharge for instructions on resumption.  11. OSA: Continue CPAP at nights/when asleep. 12. Asymptomatic tachycardia/NSVT: Monitor for symptoms. Coreg titrated up to home dose today.     I have personally performed a face to face diagnostic evaluation of this patient. Additionally, I have reviewed and concur with the physician assistant's documentation above.  Meredith Staggers, MD, [facility]  09/04/2017

## 2017-09-04 NOTE — Progress Notes (Signed)
Physical Therapy Treatment Patient Details Name: David [name] MRN: [id number] DOB: Apr 12, 1941 Today's Date: 09/04/2017    History of Present Illness 77 y.o. 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 peripheral neuropathy who presents with left-sided weakness. CT negative for acute abnormality, however, pending MRI.     PT Comments    Has rebounded well from this weekends episode.  Gait stability is improving.  Pt still staggering with fatigue.  Pt will make a great rehab candidate.   Follow Up Recommendations  CIR     Equipment Recommendations  None recommended by PT    Recommendations for Other Services Rehab consult     Precautions / Restrictions      Mobility  Bed Mobility Overal bed mobility: Needs Assistance Bed Mobility: Supine to Sit     Supine to sit: Min assist        Transfers Overall transfer level: Needs assistance Equipment used: Rolling walker (2 wheeled) Transfers: Sit to/from Stand Sit to Stand: Min assist         General transfer comment: cues for hand placement;  assist to come forward more than boosting up.  pt did not line up well  and back up to the chair and was saved from sitting in the floor.  Ambulation/Gait Ambulation/Gait assistance: Min assist Ambulation Distance (Feet): 120 Feet Assistive device: Rolling walker (2 wheeled) Gait Pattern/deviations: Step-through pattern(mildly retropulsive)     General Gait Details: pt mildly unsteady overall, worsening slightly with fatigue.   Pt listing posteriorly, needing min assist for balance and some propulsion.   Stairs             Wheelchair Mobility    Modified Rankin (Stroke Patients Only) Modified Rankin (Stroke Patients Only) Pre-Morbid Rankin Score: No significant disability Modified Rankin: Moderately severe disability     Balance Overall balance  assessment: Needs assistance   Sitting balance-Leahy Scale: Fair     Standing balance support: Bilateral upper extremity supported;During functional activity Standing balance-Leahy Scale: Poor Standing balance comment: Reliant on BUE support and external support.                             Cognition Arousal/Alertness: Awake/alert Behavior During Therapy: WFL for tasks assessed/performed Overall Cognitive Status: Impaired/Different from baseline(NT formally)                                        Exercises      General Comments        Pertinent Vitals/Pain Pain Assessment: Faces Faces Pain Scale: No hurt    Home Living                      Prior Function            PT Goals (current goals can now be found in the care plan section) Acute Rehab PT Goals PT Goal Formulation: With family Time For Goal Achievement: 09/14/17 Potential to Achieve Goals: Good Progress towards PT goals: Progressing toward goals    Frequency    Min 4X/week      PT Plan Current plan remains appropriate    Co-evaluation              AM-PAC PT "6 Clicks" Daily Activity  Outcome Measure  Difficulty turning over in bed (including adjusting bedclothes, sheets and blankets)?: A Little Difficulty moving from lying on back to sitting on the side of the bed? : Unable Difficulty sitting down on and standing up from a chair with arms (e.g., wheelchair, bedside commode, etc,.)?: Unable Help needed moving to and from a bed to chair (including a wheelchair)?: A Little Help needed walking in hospital room?: A Little Help needed climbing 3-5 steps with a railing? : Total 6 Click Score: 12    End of Session   Activity Tolerance: Patient tolerated treatment well Patient left: in bed;with call bell/phone within reach;Other (comment);with family/visitor present Nurse Communication: Mobility status PT Visit Diagnosis: Unsteadiness on feet (R26.81);Other  abnormalities of gait and mobility (R26.89);Muscle weakness (generalized) (M62.81);Difficulty in walking, not elsewhere classified (R26.2);Other symptoms and signs involving the nervous system (R29.898)     Time: 1340-1401 PT Time Calculation (min) (ACUTE ONLY): 21 min  Charges:  $Gait Training: 8-22 mins                    G Codes:       2017-09-24  Donnella Sham, PT [phone]  (pager)   Tessie Fass Domenico Achord 09-24-17, 3:21 PM

## 2017-09-04 NOTE — Progress Notes (Signed)
Retta Diones, RN  Rehab Admission Coordinator  Physical Medicine and Rehabilitation  PMR Pre-admission  Signed  Date of Service:  09/04/2017 11:26 AM       Related encounter: ED to Hosp-Admission (Current) from 08/30/2017 in [facility]      Signed            Show:Clear all [x] Manual[x] Template[x] Copied  Added by: [x] Retta Diones, RN   [] Hover for details   PMR Admission Coordinator Pre-Admission Assessment  Patient: David [name] is an 77 y.o., male MRN: [id number] DOB: April 01, 1941 Height: 6\' 4"  (193 cm) Weight: 130.2 kg (287 lb)                                                                                                                                                  Insurance Information HMO: No   PPO:       PCP:       IPA:       80/20:       OTHER:   PRIMARY: Medicare A and B      Policy#: [id number]      Subscriber: patient CM Name:        Phone#:       Fax#:   Pre-Cert#:        Employer: Retired Benefits:  Phone #:       Name: Checked in [place]. Date: A=08/14/05 and B=01/14/06   Deduct: $1384      Out of Pocket Max: None      Life Max: N/A CIR: 100%      SNF: 100 days Outpatient: 80%     Co-Pay: 20% Home Health: 1005      Co-Pay: none DME: 80%     Co-Pay: 20% Providers: patient's choice  SECONDARY:  [employer]      Policy#: [id number]      Subscriber:  patient CM Name:        Phone#:       Fax#:   Pre-Cert#:        Employer: Retired Benefits:  Phone #:  [phone]     Name:   Eff. Date:       Deduct:        Out of Pocket Max:        Life Max:   CIR:        SNF:   Outpatient:       Co-Pay:   Home Health:        Co-Pay:   DME:       Co-Pay:    Emergency Contact Information         Contact Information    Name Relation Home Work Mobile   [place] Spouse [phone]   [place], Rafferty [phone]  [phone]  Current Medical History  Patient  Admitting Diagnosis: Right frontal lobe infarcts  History of Present Illness: A 77 y.o.right-handed malewith history of CAD/pacemaker maintained on aspirin, diastolic congestive heart failure, CKD stage III, chronic right foot ulcer, diabetes mellitus with insulin pump, OSA on CPAP, PVD with right second toe amputation. Per chart review patient lives with spouse. Presented 08/31/2017 with left-sided weakness times 2 days. Cranial CT reviewed, unremarkable for acute intracranial process.Patient did not receive TPA. 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 Echocardiogram with ejection fraction of 58% grade 1 diastolic dysfunction. Neurology follow-up with workup ongoing. Patient with ongoing bouts of agitation and restlessness, with patientswingingatstaff nursing, and a sitter was provided for patient safety. Presently on aspirin for CVA prophylaxis.SubcutaneousLovenox for DVT prophylaxis. Physical and occupational therapy evaluations are pending. MD has requested physical medicine rehab consult.   Total: 0=NIH  Past Medical History      Past Medical History:  Diagnosis Date  . Anemia, iron deficiency   . Anxiety   . Arthritis   . BPH (benign prostatic hypertrophy)   . CAD (coronary artery disease)    Nonobstructive CAD per cath  . Cardiac pacemaker in situ   . CHF (congestive heart failure) ([place])   . Chronic ulcer of right foot ([place])   . CKD (chronic kidney disease), stage III ([place]) secondary to DM and HTN   nephrologist-  [name]  . Dyspnea   . History of cellulitis    right great toe 10-25-2014  . History of skin cancer   . HOCM (hypertrophic obstructive cardiomyopathy) ([place])   . Hypertension   . 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 Insulin pump in place   . OSA on CPAP   . Peripheral neuropathy    severe  . Peripheral vascular disease ([place])    bilateral lower  extremities  . Rib fracture 07/24/2015  . Secondary hyperparathyroidism of renal origin ([place])   . Sinus node dysfunction ([facility])   . Sleep apnea     Family History  family history includes Cancer in his mother; Heart attack in his father.  Prior Rehab/Hospitalizations:  Has the patient had major surgery during 100 days prior to admission? No  Current Medications   Current Facility-Administered Medications:  .  acetaminophen (TYLENOL) tablet 650 mg, 650 mg, Oral, Q4H PRN **OR** acetaminophen (TYLENOL) solution 650 mg, 650 mg, Per Tube, Q4H PRN **OR** acetaminophen (TYLENOL) suppository 650 mg, 650 mg, Rectal, Q4H PRN, Memon, Jehanzeb, MD .  aspirin suppository 300 mg, 300 mg, Rectal, Daily **OR** aspirin tablet 325 mg, 325 mg, Oral, Daily, Memon, Jehanzeb, MD, 325 mg at 09/04/17 1021 .  atorvastatin (LIPITOR) tablet 10 mg, 10 mg, Oral, q1800, Kathie Dike, MD, 10 mg at 09/03/17 1807 .  calcitRIOL (ROCALTROL) capsule 0.25 mcg, 0.25 mcg, Oral, Daily, Memon, Jehanzeb, MD, 0.25 mcg at 09/04/17 1021 .  carvedilol (COREG) tablet 25 mg, 25 mg, Oral, BID WC, Hongalgi, Anand D, MD, 25 mg at 09/04/17 0809 .  cholecalciferol (VITAMIN D) tablet 1,000 Units, 1,000 Units, Oral, QPM, Kathie Dike, MD, 1,000 Units at 09/03/17 1807 .  enoxaparin (LOVENOX) injection 40 mg, 40 mg, Subcutaneous, Q24H, Memon, Jehanzeb, MD, 40 mg at 09/03/17 1806 .  hydrOXYzine (ATARAX/VISTARIL) tablet 50 mg, 50 mg, Oral, TID PRN, Kathie Dike, MD, 50 mg at 09/03/17 2152 .  insulin aspart (novoLOG) injection 0-15 Units, 0-15 Units, Subcutaneous, TID WC, Kathie Dike, MD, 2 Units  at 09/04/17 0817 .  insulin aspart (novoLOG) injection 4 Units, 4 Units, Subcutaneous, TID WC, Kathie Dike, MD, 4 Units at 09/04/17 0813 .  insulin glargine (LANTUS) injection 15 Units, 15 Units, Subcutaneous, BID, Kathie Dike, MD, 15 Units at 09/04/17 1027 .  isosorbide mononitrate (IMDUR) 24 hr tablet 30 mg, 30 mg, Oral, Daily,  Hongalgi, Anand D, MD, 30 mg at 09/04/17 1031 .  pantoprazole (PROTONIX) EC tablet 40 mg, 40 mg, Oral, Daily, Memon, Jehanzeb, MD, 40 mg at 09/04/17 1020 .  senna-docusate (Senokot-S) tablet 1 tablet, 1 tablet, Oral, QHS PRN, Kathie Dike, MD, 1 tablet at 09/04/17 0810 .  sertraline (ZOLOFT) tablet 100 mg, 100 mg, Oral, Daily, Memon, Jehanzeb, MD, 100 mg at 09/04/17 1020 .  torsemide (DEMADEX) tablet 10 mg, 10 mg, Oral, BID, Hongalgi, Anand D, MD, 10 mg at 09/04/17 0810 .  traMADol (ULTRAM) tablet 50-100 mg, 50-100 mg, Oral, Q6H PRN, Kathie Dike, MD .  vitamin B-12 (CYANOCOBALAMIN) tablet 100 mcg, 100 mcg, Oral, Daily, Memon, Jehanzeb, MD, 100 mcg at 09/04/17 1021  Patients Current Diet: Fall precautions Diet heart healthy/carb modified Room service appropriate? Yes; Fluid consistency: Thin  Precautions / Restrictions Precautions Precautions: Fall Restrictions Weight Bearing Restrictions: No   Has the patient had 2 or more falls or a fall with injury in the past year?Yes.  Daughter reports 2 falls in the past year  Prior Activity Level Community (5-7x/wk): Went out daily.  Was driving.  Picked up children in the afternoons.  Home Assistive Devices / Equipment Home Assistive Devices/Equipment: Eyeglasses Home Equipment: Environmental consultant - 2 wheels, Cane - single point, Civil engineer, contracting  Prior Device Use: Indicate devices/aids used by the patient prior to current illness, exacerbation or injury? Cane when outside of home  Prior Functional Level Prior Function Level of Independence: Independent with assistive device(s) Comments: used cane and furniture walked; drove but wife states it was "scarey"  Self Care: Did the patient need help bathing, dressing, using the toilet or eating?  Independent  Indoor Mobility: Did the patient need assistance with walking from room to room (with or without device)? Independent  Stairs: Did the patient need assistance with internal or external  stairs (with or without device)? Independent  Functional Cognition: Did the patient need help planning regular tasks such as shopping or remembering to take medications? Independent  Current Functional Level Cognition  Arousal/Alertness: Awake/alert Overall Cognitive Status: Impaired/Different from baseline Current Attention Level: Sustained Orientation Level: Oriented X4 Following Commands: Follows one step commands inconsistently Safety/Judgement: Decreased awareness of safety, Decreased awareness of deficits General Comments: cognition most likely impacted by medicatioin but is impaired. will further assess Attention: Focused, Sustained, Selective Focused Attention: Appears intact Sustained Attention: Appears intact Selective Attention: Impaired Selective Attention Impairment: Functional basic, Verbal basic Memory: Impaired Memory Impairment: Storage deficit, Decreased recall of new information(delayed recall 2/5) Awareness: Impaired Awareness Impairment: Emergent impairment Problem Solving: Impaired Problem Solving Impairment: Functional complex Executive Function: Organizing, Sequencing Sequencing: Impaired Sequencing Impairment: Functional complex, Verbal complex Organizing: Impaired Organizing Impairment: Functional basic(clock drawing impaired) Safety/Judgment: Impaired    Extremity Assessment (includes Sensation/Coordination)  Upper Extremity Assessment: Defer to OT evaluation LUE Deficits / Details: general weakness; appears weaker distally; poor in hand manipulatioin skills; states hand feels "numb"; "alien arm " type movements during functional tasks; unaware of L hand falling off RW LUE Sensation: decreased light touch, decreased proprioception LUE Coordination: decreased fine motor, decreased gross motor  Lower Extremity Assessment: RLE deficits/detail, LLE deficits/detail RLE Deficits / Details:  R toe amputations at baseline.  RLE Sensation: history of  peripheral neuropathy LLE Deficits / Details: Generalized weakness as compared to RLE noted.  LLE Sensation: history of peripheral neuropathy    ADLs  Overall ADL's : Needs assistance/impaired Eating/Feeding: Set up, Supervision/ safety, Sitting Grooming: Minimal assistance, Sitting Upper Body Bathing: Minimal assistance, Sitting Lower Body Bathing: Moderate assistance, Sit to/from stand Upper Body Dressing : Moderate assistance, Sitting Lower Body Dressing: Maximal assistance, Sit to/from stand Toilet Transfer: RW, Moderate assistance Toileting- Clothing Manipulation and Hygiene: Moderate assistance Toileting - Clothing Manipulation Details (indicate cue type and reason): began to urinate prior to placement of urinal Functional mobility during ADLs: Moderate assistance, +2 for physical assistance, Rolling walker, Cueing for safety, Cueing for sequencing(limited)    Mobility  Overal bed mobility: Needs Assistance Bed Mobility: Supine to Sit, Sit to Supine Supine to sit: Min assist Sit to supine: Min guard General bed mobility comments: initially posterior lean    Transfers  Overall transfer level: Needs assistance Equipment used: Rolling walker (2 wheeled) Transfers: Sit to/from Stand Sit to Stand: Mod assist, +2 physical assistance General transfer comment: poor use of RW; no knee buskling noted; poor proprioception of LLE; hx of peripheral neuropathy; did not follow commands for safe use of RW. Performed X 2, as pt abruptly sitting on first attempt.     Ambulation / Gait / Stairs / Wheelchair Mobility  Ambulation/Gait General Gait Details: Pt with poor sequencing and unable to follow commands to take side steps at EOB. Pt with decreased awareness of RUE and unable to maintain placement on RW.     Posture / Balance Balance Overall balance assessment: Needs assistance Sitting-balance support: No upper extremity supported, Feet supported Sitting balance-Leahy Scale:  Fair Postural control: Posterior lean Standing balance support: Bilateral upper extremity supported, During functional activity Standing balance-Leahy Scale: Poor Standing balance comment: Reliant on BUE support and external support.     Special needs/care consideration BiPAP/CPAP Yes, CPAP at bedside CPM No Continuous Drip IV No Dialysis No         Life Vest No Oxygen No Special Bed No Trach Size No Wound Vac (area) No       Skin Has had toe amputations in the past, so needs careful foot care.                           Bowel mgmt: Last BM 09/04/17 per family Bladder mgmt: Voiding in urinal Diabetic mgmt Yes, on insulin at home    Previous Home Environment Living Arrangements: Spouse/significant other  Lives With: Spouse Available Help at Discharge: Family, Available 24 hours/day Type of Home: House Home Layout: One level Home Access: Stairs to enter Entrance Stairs-Rails: Right Entrance Stairs-Number of Steps: 1 Bathroom Shower/Tub: Tub/shower unit, Architectural technologist: Standard Bathroom Accessibility: Yes How Accessible: Accessible via walker Gravity: No  Discharge Living Setting Plans for Discharge Living Setting: Patient's home, House, Lives with (comment)(Lives with wife.) Type of Home at Discharge: Other (Comment)(Townhouse) Discharge Home Layout: One level Discharge Home Access: Stairs to enter Entrance Stairs-Number of Steps: 1 step entry Does the patient have any problems obtaining your medications?: No  Social/Family/Support Systems Patient Roles: Spouse, Parent(Has a wife, a son and 2 daughters.) Contact Information: Daton Szilagyi - spouse Anticipated Caregiver: Wife, daughters Anticipated Caregiver's Contact Information: [name] - wife - [phone] Ability/Limitations of Caregiver: Wife can provide supervision, she is limited with physical assistance.  Daughters can also  assist some. Caregiver Availability: 24/7 Discharge Plan  Discussed with Primary Caregiver: Yes Is Caregiver In Agreement with Plan?: Yes Does Caregiver/Family have Issues with Lodging/Transportation while Pt is in Rehab?: No  Goals/Additional Needs Patient/Family Goal for Rehab: PT/OT supervision, SLP supervision to min assist goals Expected length of stay: 12-16 days Cultural Considerations: Lutheran Dietary Needs: Heart healthy, carb mod, thin liquids Equipment Needs: TBD Pt/Family Agrees to Admission and willing to participate: Yes Program Orientation Provided & Reviewed with Pt/Caregiver Including Roles  & Responsibilities: Yes  Decrease burden of Care through IP rehab admission: N/A  Possible need for SNF placement upon discharge: Not anticipated  Patient Condition: This patient's medical and functional status has changed since the consult dated: 08/1917 in which the Rehabilitation Physician determined and documented that the patient's condition is appropriate for intensive rehabilitative care in an inpatient rehabilitation facility. See "History of Present Illness" (above) for medical update. Functional changes are:  Currently requiring mod assist +2 for transfers. Patient's medical and functional status update has been discussed with the Rehabilitation physician and patient remains appropriate for inpatient rehabilitation. Will admit to inpatient rehab today.  Preadmission Screen Completed By:  Retta Diones, 09/04/2017 11:42 AM ______________________________________________________________________   Discussed status with Dr. Naaman Plummer on 09/04/17 at 1140 and received telephone approval for admission today.  Admission Coordinator:  Retta Diones, time 1140/Date 09/04/17             Cosigned by: Meredith Staggers, MD at 09/04/2017 11:58 AM  Revision History

## 2017-09-05 ENCOUNTER — Inpatient Hospital Stay (HOSPITAL_COMMUNITY): Payer: Medicare Other

## 2017-09-05 ENCOUNTER — Telehealth: Payer: Self-pay | Admitting: Endocrinology

## 2017-09-05 ENCOUNTER — Inpatient Hospital Stay (HOSPITAL_COMMUNITY): Payer: Medicare Other | Admitting: Physical Therapy

## 2017-09-05 DIAGNOSIS — M1712 Unilateral primary osteoarthritis, left knee: Secondary | ICD-10-CM

## 2017-09-05 DIAGNOSIS — I5032 Chronic diastolic (congestive) heart failure: Secondary | ICD-10-CM

## 2017-09-05 DIAGNOSIS — E1165 Type 2 diabetes mellitus with hyperglycemia: Secondary | ICD-10-CM

## 2017-09-05 DIAGNOSIS — I639 Cerebral infarction, unspecified: Secondary | ICD-10-CM

## 2017-09-05 LAB — COMPREHENSIVE METABOLIC PANEL
ALT: 19 U/L (ref 17–63)
AST: 16 U/L (ref 15–41)
Albumin: 3.1 g/dL — ABNORMAL LOW (ref 3.5–5.0)
Alkaline Phosphatase: 88 U/L (ref 38–126)
Anion gap: 9 (ref 5–15)
BUN: 53 mg/dL — ABNORMAL HIGH (ref 6–20)
CO2: 22 mmol/L (ref 22–32)
Calcium: 8.7 mg/dL — ABNORMAL LOW (ref 8.9–10.3)
Chloride: 104 mmol/L (ref 101–111)
Creatinine, Ser: 2.39 mg/dL — ABNORMAL HIGH (ref 0.61–1.24)
GFR calc Af Amer: 28 mL/min — ABNORMAL LOW (ref >60)
GFR calc non Af Amer: 25 mL/min — ABNORMAL LOW (ref >60)
Glucose, Bld: 176 mg/dL — ABNORMAL HIGH (ref 65–99)
Potassium: 4 mmol/L (ref 3.5–5.1)
Sodium: 135 mmol/L (ref 135–145)
Total Bilirubin: 0.6 mg/dL (ref 0.3–1.2)
Total Protein: 5.8 g/dL — ABNORMAL LOW (ref 6.5–8.1)

## 2017-09-05 LAB — CBC WITH DIFFERENTIAL/PLATELET
Basophils Absolute: 0 10*3/uL (ref 0.0–0.1)
Basophils Relative: 0 %
Eosinophils Absolute: 0.2 10*3/uL (ref 0.0–0.7)
Eosinophils Relative: 3 %
HCT: 31.8 % — ABNORMAL LOW (ref 39.0–52.0)
Hemoglobin: 10.4 g/dL — ABNORMAL LOW (ref 13.0–17.0)
Lymphocytes Relative: 21 %
Lymphs Abs: 1.3 10*3/uL (ref 0.7–4.0)
MCH: 30.2 pg (ref 26.0–34.0)
MCHC: 32.7 g/dL (ref 30.0–36.0)
MCV: 92.4 fL (ref 78.0–100.0)
Monocytes Absolute: 0.9 10*3/uL (ref 0.1–1.0)
Monocytes Relative: 15 %
Neutro Abs: 3.7 10*3/uL (ref 1.7–7.7)
Neutrophils Relative %: 61 %
Platelets: 156 10*3/uL (ref 150–400)
RBC: 3.44 MIL/uL — ABNORMAL LOW (ref 4.22–5.81)
RDW: 14.8 % (ref 11.5–15.5)
WBC: 6.2 10*3/uL (ref 4.0–10.5)

## 2017-09-05 LAB — GLUCOSE, CAPILLARY
Glucose-Capillary: 167 mg/dL — ABNORMAL HIGH (ref 65–99)
Glucose-Capillary: 211 mg/dL — ABNORMAL HIGH (ref 65–99)
Glucose-Capillary: 200 mg/dL — ABNORMAL HIGH (ref 65–99)
Glucose-Capillary: 233 mg/dL — ABNORMAL HIGH (ref 65–99)

## 2017-09-05 NOTE — Telephone Encounter (Signed)
[name] from note called and notified of MD message. [name] verbalized understanding.

## 2017-09-05 NOTE — Progress Notes (Signed)
Inpatient [facility] Individual Statement of Services  Patient Name:  David [name]  Date:  09/05/2017  Welcome to the [facility].  Our goal is to provide you with an individualized program based on your diagnosis and situation, designed to meet your specific needs.  With this comprehensive rehabilitation program, you will be expected to participate in at least 3 hours of rehabilitation therapies Monday-Friday, with modified therapy programming on the weekends.  Your rehabilitation program will include the following services:  Physical Therapy (PT), Occupational Therapy (OT), Speech Therapy (ST), 24 hour per day rehabilitation nursing, Neuropsychology, Case Management (Social Worker), Rehabilitation Medicine, Nutrition Services and Pharmacy Services  Weekly team conferences will be held on Wednesdays to discuss your progress.  Your Social Worker will talk with you frequently to get your input and to update you on team discussions.  Team conferences with you and your family in attendance may also be held.  Expected length of stay:  5 to 7 days  Overall anticipated outcome:  Modified Independent with supervision needed for community ambulation, stairs, and complex cognitive/memory tasks  Depending on your progress and recovery, your program may change. Your Social Worker will coordinate services and will keep you informed of any changes. Your Social Worker's name and contact numbers are listed  below.  The following services may also be recommended but are not provided by the [facility] will be made to provide these services after discharge if needed.  Arrangements include referral to agencies that provide these services.  Your insurance has been verified to be:  Medicare and [employer] Your primary doctor is:  Dr. Dimas Chyle  Pertinent information will be shared with your doctor and your insurance company.  Social Worker:  Alfonse Alpers, LCSW  [phone] or [phone]  Information discussed with and copy given to patient by: Trey Sailors, 09/05/2017, 12:29 PM

## 2017-09-05 NOTE — Evaluation (Signed)
Occupational Therapy Assessment and Plan  Patient Details  Name: David [name] MRN: [id number] Date of Birth: March 05, 1941  OT Diagnosis: muscle weakness (generalized) Rehab Potential: Rehab Potential (ACUTE ONLY): Excellent ELOS: 5-7 days   Today's Date: 09/05/2017 OT Individual Time: 1017-5102 OT Individual Time Calculation (min): 75 min     Problem List:  Patient Active Problem List   Diagnosis Date Noted  . Ischemic stroke of frontal lobe ([place]) 09/04/2017  . Agitation   . Restless   . Diastolic dysfunction   . Coronary artery disease involving native coronary artery of native heart without angina pectoris   . Cardiac pacemaker in situ   . Stage 3 chronic kidney disease ([place])   . Atrial fibrillation with rapid ventricular response ([place])   . Acute blood loss anemia   . PVD (peripheral vascular disease) ([place])   . Tachypnea   . Acute ischemic stroke ([place]) 08/31/2017  . Lower extremity edema 04/21/2017  . Anxiety 03/22/2017  . Acute cholecystitis s/p lap cholecystectomy 03/05/2017 03/04/2017  . HOCM (hypertrophic obstructive cardiomyopathy) ([place]) 03/04/2017  . 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 Fatigue 01/27/2017  . Low vitamin B12 level 01/27/2017  . Chronic ulcer of right foot ([place]) 08/09/2016  . Syncope 07/24/2015  . 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 OSA on CPAP 10/26/2014  . Back pain 10/16/2014  . Left leg numbness 10/16/2014  . Episodic lightheadedness 08/04/2014  . Open toe wound 07/18/2014  . Diabetes mellitus with neurological manifestations, uncontrolled ([place]) 02/27/2014  . Seasonal and perennial allergic rhinitis 10/25/2013  . Hypertension associated with diabetes ([place]) 10/25/2013  . Hyperlipidemia associated with type 2 diabetes mellitus ([place]) 10/25/2013  . Morbid obesity ([place]) 10/25/2013  . CKD stage 3 due to type 2 diabetes mellitus ([place]) 10/25/2013    Past Medical History:  Past Medical  History:  Diagnosis Date  . Anemia, iron deficiency   . Anxiety   . Arthritis   . BPH (benign prostatic hypertrophy)   . CAD (coronary artery disease)    Nonobstructive CAD per cath  . Cardiac pacemaker in situ   . CHF (congestive heart failure) ([place])   . Chronic ulcer of right foot ([place])   . CKD (chronic kidney disease), stage III ([place]) secondary to DM and HTN   nephrologist-  [name]  . Dyspnea   . History of cellulitis    right great toe 10-25-2014  . History of skin cancer   . HOCM (hypertrophic obstructive cardiomyopathy) ([place])   . Hypertension   . Insulin dependent type 2 diabetes mellitus ([place]) 1991   followd by [name]--  has insulin pump  . Insulin pump in place   . OSA on CPAP   . Peripheral neuropathy    severe  . Peripheral vascular disease ([place])    bilateral lower extremities  . Rib fracture 07/24/2015  . Secondary hyperparathyroidism of renal origin ([place])   . Sinus node dysfunction ([facility])   . Sleep apnea    Past Surgical History:  Past Surgical History:  Procedure Laterality Date  . 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 AMPUTATION TOE Right 03/12/2015   Procedure: RIGHT HALLUS AMPUTATION ;  Surgeon: [name], MD;  Location: [facility];  Service: Podiatry;  Laterality: Right;  . CARDIAC CATHETERIZATION  11-25-2010   [place], [place]   Nonobstructive CAD  . CARDIAC PACEMAKER PLACEMENT  Nov 2009  [employer]  . CHOLECYSTECTOMY N/A 03/05/2017   Procedure: LAPAROSCOPIC CHOLECYSTECTOMY WITH INTRAOPERATIVE CHOLANGIOGRAM;  Surgeon: [name], MD;  Location: [facility];  Service: General;  Laterality: N/A;  . EP IMPLANTABLE DEVICE N/A 06/03/2015   Procedure: PPM Generator Changeout;  Surgeon: [name], MD;  Location: [facility];  Service: Cardiovascular;  Laterality: N/A;  . EXCISION BONE CYST Right 03/06/2015   Procedure: BONE BIOPSIES OF RIGHT FOOT;  Surgeon: [name], MD;  Location: [facility];  Service: Podiatry;  Laterality: Right;  . ORIF ANKLE FRACTURE Left 11/06/2014   Procedure: OPEN REDUCTION INTERNAL FIXATION (ORIF) LEFT  ANKLE FRACTURE;  Surgeon: [name], MD;  Location: [facility];  Service: Orthopedics;  Laterality: Left;  . TEE WITHOUT CARDIOVERSION N/A 09/01/2017   Procedure: TRANSESOPHAGEAL ECHOCARDIOGRAM (TEE);  Surgeon: [name], MD;  Location: [facility];  Service: Cardiovascular;  Laterality: N/A;  . TOTAL KNEE ARTHROPLASTY    . VEIN LIGATION AND STRIPPING      Assessment & Plan Clinical Impression: [name] is a 77 year old right handed male with history of type 2 diabetes mellitus with neuropathy, CAD, HCM s/p PPM, CKD stage III, age-related macular degeneration who was admitted on 08/31/2016 with confusion, left sided numbness and weakness as well as speech difficulty. CT of head was negative for acute changes. MRI of brain done revealing 2 small areas of restricted diffusion in the right frontal cortex compatible with acute infarct.   Patient transferred to CIR on 09/04/2017 .    Patient currently requires supervision with basic self-care skills secondary to muscle weakness, decreased cardiorespiratoy endurance, decreased coordination,  and decreased dynamic standing balance.  Prior to hospitalization, patient could complete ADLs with modified independent .  Patient will benefit from skilled intervention to increase independence with basic self-care skills and increase level of independence with iADL prior to discharge home independently.  Anticipate patient will require intermittent supervision and no further OT follow recommended.  OT - End of Session Activity Tolerance: Tolerates 10 - 20 min activity with multiple rests Endurance Deficit: Yes Endurance Deficit Description: SOB following functional mobility, generalized weakness OT Assessment Rehab Potential (ACUTE ONLY): Excellent OT Patient demonstrates  impairments in the following area(s): Balance;Endurance;Perception;Safety;Cognition;Edema OT Basic ADL's Functional Problem(s): Toileting;Bathing;Dressing OT Advanced ADL's Functional Problem(s): Light Housekeeping;Simple Meal Preparation OT Transfers Functional Problem(s): Toilet;Tub/Shower OT Plan OT Intensity: Minimum of 1-2 x/day, 45 to 90 minutes OT Frequency: 5 out of 7 days OT Duration/Estimated Length of Stay: 5-7 days OT Treatment/Interventions: Balance/vestibular training;Cognitive remediation/compensation;Community reintegration;Discharge planning;Disease mangement/prevention;DME/adaptive equipment instruction;Functional mobility training;Patient/family education;Self Care/advanced ADL retraining;Therapeutic Exercise;Therapeutic Activities;UE/LE Strength taining/ROM;UE/LE Coordination activities OT Basic Self-Care Anticipated Outcome(s): mod I OT Toileting Anticipated Outcome(s): mod I OT Bathroom Transfers Anticipated Outcome(s): mod I OT Recommendation Recommendations for Other Services: Vestibular eval;Speech consult Patient destination: Home Follow Up Recommendations: None Equipment Recommended: To be determined   Skilled Therapeutic Intervention Pt received sitting up in recliner agreeable to OT with no c/o pain. Education provided re purpose/indication of OT services, CVA education, and jointly establishing OT POC. Pt refused bathing, stating that he was uncomfortable bathing with a male present. Pt performed simulated toilet transfer with (S). Recommendations provided re bathroom set up to reduce fall risk, as well as grab bar installation. Pt completed 150 ft of functional mobility with RW before requiring a seated rest break and experiencing SOB. Pt continued functional mobility to ADL apartment and performed tub transfer with min guard. Demo provided re technique  and safely entering/exiting a tub. Extensive discussion/Q & A with pt re community reintegration, including  participation in community groups such as [employer] Social research officer, government, and joining a Acupuncturist. Pt completed 235f of functional mobility back to room and sat in recliner. Functional reaching task performed from floor level and anterior to pt. Pt stated he has had vestibular issues in the past, this OT provided edu re vestibular eval and pt extremely interested. Pt instructed to call before standing up and the importance of fall prevention strategies. Pt left with chair alarm set and all needs met.   OT Evaluation Precautions/Restrictions  Precautions Precautions: Fall Restrictions Weight Bearing Restrictions: No General Chart Reviewed: Yes Family/Caregiver Present: No Vital Signs Therapy Vitals Pulse Rate: (!) 59 Resp: 18 BP: (!) 137/57 Patient Position (if appropriate): Sitting Oxygen Therapy SpO2: 98 % O2 Device: Room Air Pain Pain Assessment Pain Scale: 0-10 Pain Score: 0-No pain Home Living/Prior Functioning Home Living Family/patient expects to be discharged to:: Private residence Living Arrangements: Spouse/significant other Available Help at Discharge: Family, Available 24 hours/day Type of Home: House Home Access: Stairs to enter ETechnical brewerof Steps: 1 Entrance Stairs-Rails: Right Home Layout: One level Bathroom Shower/Tub: Tub/shower unit, CArchitectural technologist SProgrammer, systems Yes  Lives With: Spouse IADL History Current License: Yes Mode of Transportation: Car Occupation: Retired 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: Independent with basic ADLs  Able to Take Stairs?: Yes Vocation: Retired Leisure: Hobbies-yes (Comment)(Walking dogs, working on computer) Comments: used tripod cane ~8 months prior to admission ADL ADL ADL Comments: See functional navigator Vision Baseline Vision/History: Wears glasses;Macular Degeneration Wears Glasses: At all times Patient Visual Report: No change  from baseline Vision Assessment?: No apparent visual deficits Perception  Perception: Within Functional Limits Praxis Praxis: Intact Cognition Overall Cognitive Status: Impaired/Different from baseline Arousal/Alertness: Awake/alert Orientation Level: Place;Person;Situation Person: Oriented Place: Oriented Situation: Oriented Year: 2019 Month: April Day of Week: Incorrect Memory: Impaired Memory Impairment: Decreased recall of new information Memory Recall: Sock;Blue Memory Recall Sock: With Cue Memory Recall Blue: With Cue Attention: Selective Focused Attention: Appears intact Sustained Attention: Appears intact Selective Attention: Impaired Selective Attention Impairment: Verbal basic;Functional basic Awareness: Impaired Awareness Impairment: Emergent impairment Problem Solving: Impaired Problem Solving Impairment: Functional complex Executive Function: Organizing;Sequencing Sequencing: Impaired Sequencing Impairment: Functional complex;Verbal complex Organizing: Impaired Organizing Impairment: Functional basic Safety/Judgment: Impaired Comments: Poor awareness of deficits  Sensation Sensation Light Touch: Impaired Detail Light Touch Impaired Details: Impaired LLE;Impaired RLE(impaired light touch, deep pressure intact) Stereognosis: Appears Intact Hot/Cold: Appears Intact Proprioception: Appears Intact Coordination Gross Motor Movements are Fluid and Coordinated: No Fine Motor Movements are Fluid and Coordinated: Yes Motor  Motor Motor: Within Functional Limits Mobility  Bed Mobility Bed Mobility: Rolling Right;Rolling Left;Supine to Sit;Sit to Supine Rolling Right: 5: Supervision Rolling Left: 5: Supervision Supine to Sit: 5: Supervision Sit to Supine: 5: Supervision Sit to Supine - Details: Verbal cues for technique;Verbal cues for sequencing Transfers Transfers: Sit to Stand;Stand to Sit Sit to Stand: 4: Min guard Sit to Stand Details: Verbal cues  for technique;Verbal cues for sequencing Stand to Sit: 4: Min guard Stand to Sit Details (indicate cue type and reason): Verbal cues for precautions/safety  Trunk/Postural Assessment  Cervical Assessment Cervical Assessment: Within Functional Limits Thoracic Assessment Thoracic Assessment: Within Functional Limits Lumbar Assessment Lumbar Assessment: Within Functional Limits Postural Control Postural Control: Within Functional Limits  Balance Balance Balance Assessed: Yes Static Standing Balance Static Standing - Balance Support: No upper extremity supported  Dynamic Standing Balance Dynamic Standing - Balance Activities: Reaching for objects Extremity/Trunk Assessment RUE Assessment RUE Assessment: Within Functional Limits LUE Assessment LUE Assessment: Within Functional Limits   See Function Navigator for Current Functional Status.   Refer to Care Plan for Long Term Goals  Recommendations for other services: None    Discharge Criteria: Patient will be discharged from OT if patient refuses treatment 3 consecutive times without medical reason, if treatment goals not met, if there is a change in medical status, if patient makes no progress towards goals or if patient is discharged from hospital.  The above assessment, treatment plan, treatment alternatives and goals were discussed and mutually agreed upon: by patient  Curtis Sites 09/05/2017, 4:31 PM

## 2017-09-05 NOTE — Progress Notes (Signed)
Patient information reviewed and entered into eRehab system by Lexxus Underhill, RN, CRRN, PPS Coordinator.  Information including medical coding and functional independence measure will be reviewed and updated through discharge.     Per nursing patient was given "Data Collection Information Summary for Patients in Inpatient Rehabilitation Facilities with attached "Privacy Act Statement-Health Care Records" upon admission.  

## 2017-09-05 NOTE — Evaluation (Addendum)
Physical Therapy Assessment and Plan  Patient Details  Name: David [name] MRN: [id number] Date of Birth: 02-Feb-1941  PT Diagnosis: Abnormality of gait, L Hemiplegia, Impaired sensation and Muscle weakness Rehab Potential: Good ELOS: 5-7 days   Today's Date: 09/05/2017 PT Individual Time: 4270-6237 PT Individual Time Calculation (min): 56 min    Problem List:  Patient Active Problem List   Diagnosis Date Noted  . Ischemic stroke of frontal lobe ([place]) 09/04/2017  . Agitation   . Restless   . Diastolic dysfunction   . Coronary artery disease involving native coronary artery of native heart without angina pectoris   . Cardiac pacemaker in situ   . Stage 3 chronic kidney disease ([place])   . Atrial fibrillation with rapid ventricular response ([place])   . Acute blood loss anemia   . PVD (peripheral vascular disease) ([place])   . Tachypnea   . Acute ischemic stroke ([place]) 08/31/2017  . Lower extremity edema 04/21/2017  . Anxiety 03/22/2017  . Acute cholecystitis s/p lap cholecystectomy 03/05/2017 03/04/2017  . HOCM (hypertrophic obstructive cardiomyopathy) ([place]) 03/04/2017  . 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 Fatigue 01/27/2017  . Low vitamin B12 level 01/27/2017  . Chronic ulcer of right foot ([place]) 08/09/2016  . Syncope 07/24/2015  . 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 OSA on CPAP 10/26/2014  . Back pain 10/16/2014  . Left leg numbness 10/16/2014  . Episodic lightheadedness 08/04/2014  . Open toe wound 07/18/2014  . Diabetes mellitus with neurological manifestations, uncontrolled ([place]) 02/27/2014  . Seasonal and perennial allergic rhinitis 10/25/2013  . Hypertension associated with diabetes ([place]) 10/25/2013  . Hyperlipidemia associated with type 2 diabetes mellitus ([place]) 10/25/2013  . Morbid obesity ([place]) 10/25/2013  . CKD stage 3 due to type 2 diabetes mellitus ([place]) 10/25/2013    Past Medical History:  Past  Medical History:  Diagnosis Date  . Anemia, iron deficiency   . Anxiety   . Arthritis   . BPH (benign prostatic hypertrophy)   . CAD (coronary artery disease)    Nonobstructive CAD per cath  . Cardiac pacemaker in situ   . CHF (congestive heart failure) ([place])   . Chronic ulcer of right foot ([place])   . CKD (chronic kidney disease), stage III ([place]) secondary to DM and HTN   nephrologist-  [name]  . Dyspnea   . History of cellulitis    right great toe 10-25-2014  . History of skin cancer   . HOCM (hypertrophic obstructive cardiomyopathy) ([place])   . Hypertension   . Insulin dependent type 2 diabetes mellitus ([place]) 1991   followd by [name]--  has insulin pump  . Insulin pump in place   . OSA on CPAP   . Peripheral neuropathy    severe  . Peripheral vascular disease ([place])    bilateral lower extremities  . Rib fracture 07/24/2015  . Secondary hyperparathyroidism of renal origin ([place])   . Sinus node dysfunction ([facility])   . Sleep apnea    Past Surgical History:  Past Surgical History:  Procedure Laterality Date  . AMPUTATION OF REPLICATED TOES  Mar 6283   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)  . AMPUTATION TOE Right 03/12/2015   Procedure: RIGHT HALLUS AMPUTATION ;  Surgeon: [name], MD;  Location: [facility];  Service: Podiatry;  Laterality: Right;  . CARDIAC CATHETERIZATION  11-25-2010   [place], [place]   Nonobstructive CAD  . CARDIAC PACEMAKER PLACEMENT  Nov  2009   [employer]  . CHOLECYSTECTOMY N/A 03/05/2017   Procedure: LAPAROSCOPIC CHOLECYSTECTOMY WITH INTRAOPERATIVE CHOLANGIOGRAM;  Surgeon: [name], MD;  [facility];  Service: General;  Laterality: N/A;  . EP IMPLANTABLE DEVICE N/A 06/03/2015   Procedure: PPM Generator Changeout;  Surgeon: [name], MD;  Location: [facility];  Service: Cardiovascular;  Laterality: N/A;  . EXCISION BONE CYST Right 03/06/2015   Procedure: BONE BIOPSIES OF RIGHT FOOT;  Surgeon: [name], MD;   Location: [facility];  Service: Podiatry;  Laterality: Right;  . ORIF ANKLE FRACTURE Left 11/06/2014   Procedure: OPEN REDUCTION INTERNAL FIXATION (ORIF) LEFT  ANKLE FRACTURE;  Surgeon: [name], MD;  Location: [facility];  Service: Orthopedics;  Laterality: Left;  . TEE WITHOUT CARDIOVERSION N/A 09/01/2017   Procedure: TRANSESOPHAGEAL ECHOCARDIOGRAM (TEE);  Surgeon: [name], MD;  Location: [name];  Service: Cardiovascular;  Laterality: N/A;  . TOTAL KNEE ARTHROPLASTY    . VEIN LIGATION AND STRIPPING      Assessment & Plan Clinical Impression: David [name] is a 77 year old right handed male with history of type 2 diabetes mellitus with neuropathy, CAD, HCM s/p PPM, CKD stage III, age-related macular degeneration who was admitted on 08/31/2016 with confusion, left sided numbness and weakness as well as speech difficulty. CT of head was negative for acute changes. MRI of brain done revealing 2 small areas of restricted diffusion in the right frontal cortex compatible with acute infarct. 2D echo done showing EF of 60-65% with trivial pericardial effusion. Cardiology was consulted for input and he underwent TEE revealing normal LVEF, no PFO, no thrombus and fixed plaque in the thoracic aorta. Stroke felt to be embolic due to unknonw source and ASA and Plavix recommended X 3 weeks followed by Plavix alone. Patient o follow up with Dr. Leonie Man and Dr. Caryl Comes after discharge. He had episode of syncope with fall on 4/20 and lack of pulse. Code initiated with CPR X 1 minute and dose of atropine with return of pulse and back to baseline. PPM interrogated and no A fib noted--syncope fell to be due to hypotension and medications held. He has had few beats of asymptomatic NSVT 4/21 and coreg resumed at lower dose. Low dose torsemide resumed today with recommendations to Monitor BP. Bouts of agitation resolving and family providing supervision for safety? Therapy ongoing with  cognitive deficits and weakness affecting ADLs as well as mobility. CIR recommended due to functional deficits. Patient transferred to CIR on 09/04/2017 .   Patient currently requires min guard  with mobility secondary to muscle weakness, decreased cardiorespiratoy endurance, decreased coordination, decreased visual acuity, decreased safety awareness and decreased memory and decreased postural control and hemiplegia.  Prior to hospitalization, patient was modified independent  with cane with mobility and lived with Spouse in a House home.  Home access is 1Stairs to enter.  Patient will benefit from skilled PT intervention to maximize safe functional mobility and minimize fall risk for planned discharge home with 24 hour supervision.  Anticipate patient will benefit from follow up [place] at discharge.  PT - End of Session Activity Tolerance: Tolerates 30+ min activity with multiple rests Endurance Deficit: Yes Endurance Deficit Description: SOB after functional mobility tasks, generalized weakness, fatigue PT Assessment Rehab Potential (ACUTE/IP ONLY): Good PT Patient demonstrates impairments in the following area(s): Balance;Safety;Behavior;Endurance;Motor;Sensory PT Transfers Functional Problem(s): Bed Mobility;Bed to Chair;Car;Floor;Furniture PT Locomotion Functional Problem(s): Ambulation;Stairs PT Plan PT Intensity: Minimum of 1-2 x/day ,45  to 90 minutes PT Frequency: 5 out of 7 days PT Duration Estimated Length of Stay: 5-7 days PT Treatment/Interventions: Ambulation/gait training;Discharge planning;Functional mobility training;DME/adaptive equipment instruction;Psychosocial support;Splinting/orthotics;Therapeutic Activities;UE/LE Strength taining/ROM;Balance/vestibular training;Community reintegration;Disease management/prevention;Neuromuscular re-education;Patient/family education;Stair training;Therapeutic Exercise;UE/LE Coordination activities;Skin care/wound management;Visual/perceptual  remediation/compensation;Cognitive remediation/compensation PT Transfers Anticipated Outcome(s): supervision<>mod I with LRAD PT Locomotion Anticipated Outcome(s): supervision<>mod I with LRAD PT Recommendation Recommendations for Other Services: Therapeutic Recreation consult Therapeutic Recreation Interventions: Outing/community reintergration;Pet therapy;Stress management Follow Up Recommendations: Home health PT(intermittent supervision) Patient destination: Home Equipment Recommended: To be determined(LRAD)  Skilled Therapeutic Intervention Pt received in recliner, agreeable to therapy. Pt denies pain. Pt reports being hard of hearing, states he has hearing aids but not wearing them currently. Pt educated on therapy POC and other CIR information. See PT evaluation for detailed information. Pt provided with manual w/c and cushion for mobility around hospital w/ family. Near end of session, pt reporting 8/10 fatigue level. Pt propelled w/c w/ BLE back to room and was left seated in recliner, quick release belt on, chair alarm on, and all needs within reach. Pt educated to use call bell if needing to get up at all. At end of session, pt pulse was 70 bpm and O2 sat 99%.   PT Evaluation Precautions/Restrictions Precautions Precautions: Fall Restrictions Weight Bearing Restrictions: No General Chart Reviewed: Yes Additional Pertinent History: Type 2 DM, neuropathy, macular degeneration, CAD, CKD, R toe amputations Response to Previous Treatment: Patient with no complaints from previous session. Family/Caregiver Present: No  Vital Signs BP: (!) 137/57 in sitting; 125/51 in standing; asymptomatic  SpO2: 99 % O2 Device: Room Air Pain Pain Assessment Pain Scale: 0-10 Pain Score: 0-No pain Home Living/Prior Functioning Home Living Living Arrangements: Spouse/significant other Available Help at Discharge: Family;Available 24 hours/day Type of Home: House Home Access: Stairs to  enter [employer] of Steps: 1 Entrance Stairs-Rails: Right Home Layout: One level  Lives With: Spouse Prior Function Level of Independence: Requires assistive device for independence;Independent with basic ADLs  Able to Take Stairs?: Yes Vocation: Retired Leisure: Hobbies-yes (Comment)(pool, gardening, taking dog for walk ) Comments: used tripod cane ~8 months prior to admission Vision/Perception  wears glasses at all times macular degeneration  No change from baseline  Cognition Overall Cognitive Status: Impaired/Different from baseline Arousal/Alertness: Awake/alert Orientation Level: Oriented X4 Attention: Selective Selective Attention: Impaired Memory: Impaired Memory Impairment: Decreased recall of new information Awareness: Impaired Awareness Impairment: Emergent impairment Safety/Judgment: Impaired Comments: impulsive  Sensation Sensation Light Touch: Impaired Detail Light Touch Impaired Details: Absent LLE;Absent RLE(diabetic neuropathy, absent light touch to upper calf bilaterally) Proprioception: Appears Intact Coordination Gross Motor Movements are Fluid and Coordinated: No  Motor  Motor Motor: Hemiplegia(mild L hemiplegia) Motor - Skilled Clinical Observations: generalized weakness  Mobility Bed Mobility Bed Mobility: Rolling Right;Rolling Left;Supine to Sit;Sit to Supine Rolling Right: 5: Supervision Rolling Left: 5: Supervision Supine to Sit: 5: Supervision Sit to Supine: 5: Supervision Sit to Supine - Details: Verbal cues for technique;Verbal cues for sequencing Transfers Transfers: Yes Sit to Stand: 4: Min guard Sit to Stand Details: Verbal cues for technique;Verbal cues for sequencing Stand to Sit: 4: Min guard Stand to Sit Details (indicate cue type and reason): Verbal cues for precautions/safety Locomotion  Ambulation Ambulation: Yes Ambulation/Gait Assistance: 4: Min guard Ambulation Distance (Feet): 40 Feet Assistive device:  None Gait Gait: Yes Gait Pattern: Impaired Gait Pattern: Step-through pattern;Decreased trunk rotation;Decreased stride length;Wide base of support, decreased heel strike BLE Stairs / Additional Locomotion Stairs: Yes Stairs Assistance: 4: Min  guard Stair Management Technique: Two rails Number of Stairs: 8 Height of Stairs: 6 Ramp: 5: Psychiatric nurse: Yes Wheelchair Assistance: 5: Careers information officer: Both lower extermities Wheelchair Parts Management: Supervision/cueing Distance: 194f  Trunk/Postural Assessment  Cervical Assessment Cervical Assessment: Within FScientist, physiologicalAssessment: Within Functional Limits Lumbar Assessment Lumbar Assessment: Within Functional Limits Postural Control Postural Control: Within Functional Limits  Balance Balance Balance Assessed: Yes Static Standing Balance Static Standing - Balance Support: No upper extremity supported Dynamic Standing Balance Dynamic Standing - Balance Activities: Other (comment)(gait without AD, min guard assist) Extremity Assessment  RUE Assessment RUE Assessment: Within Functional Limits LUE Assessment LUE Assessment: Within Functional Limits RLE Assessment RLE Assessment: Exceptions to [facility] AROM (degrees) Right Ankle Plantar Flexion: limited RLE Strength RLE Overall Strength: Within Functional Limits for tasks assessed Right Knee Flexion: 5/5 Right Knee Extension: 5/5 Right Ankle Dorsiflexion: 5/5 Right Ankle Plantar Flexion: 4+/5 LLE Assessment LLE Assessment: Exceptions to WFL LLE AROM (degrees) Left Ankle Plantar Flexion: (limited ) LLE Strength LLE Overall Strength: Deficits Left Knee Flexion: 4/5 Left Knee Extension: 4/5 Left Ankle Dorsiflexion: 4/5 Left Ankle Plantar Flexion: 4/5   See Function Navigator for Current Functional Status.   Refer to Care Plan for Long Term Goals  Recommendations for other  services: Recreational Therapy  Discharge Criteria: Patient will be discharged from PT if patient refuses treatment 3 consecutive times without medical reason, if treatment goals not met, if there is a change in medical status, if patient makes no progress towards goals or if patient is discharged from hospital.  The above assessment, treatment plan, treatment alternatives and goals were discussed and mutually agreed upon: by patient  LTammy Sours PT, DPT 09/05/2017, 4:47 PM

## 2017-09-05 NOTE — Telephone Encounter (Signed)
Please advise 

## 2017-09-05 NOTE — Telephone Encounter (Signed)
He can continue on Lantus and NovoLog on discharge and see me as soon as possible to discuss options

## 2017-09-05 NOTE — Progress Notes (Signed)
Pt has home CPAP at bedside.  Pt places self on CPAP.

## 2017-09-05 NOTE — Progress Notes (Signed)
Social Work Assessment and Plan  Patient Details  Name: David [name] MRN: [id number] Date of Birth: 02/24/1941  Today's Date: 09/05/2017  Problem List:  Patient Active Problem List   Diagnosis Date Noted  . Ischemic stroke of frontal lobe ([place]) 09/04/2017  . Agitation   . Restless   . Diastolic dysfunction   . Coronary artery disease involving native coronary artery of native heart without angina pectoris   . Cardiac pacemaker in situ   . Stage 3 chronic kidney disease ([place])   . Atrial fibrillation with rapid ventricular response ([place])   . Acute blood loss anemia   . PVD (peripheral vascular disease) ([place])   . Tachypnea   . Acute ischemic stroke ([place]) 08/31/2017  . Lower extremity edema 04/21/2017  . Anxiety 03/22/2017  . Acute cholecystitis s/p lap cholecystectomy 03/05/2017 03/04/2017  . HOCM (hypertrophic obstructive cardiomyopathy) ([place]) 03/04/2017  . 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 Fatigue 01/27/2017  . Low vitamin B12 level 01/27/2017  . Chronic ulcer of right foot ([place]) 08/09/2016  . Syncope 07/24/2015  . 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 OSA on CPAP 10/26/2014  . Back pain 10/16/2014  . Left leg numbness 10/16/2014  . Episodic lightheadedness 08/04/2014  . Open toe wound 07/18/2014  . Diabetes mellitus with neurological manifestations, uncontrolled ([place]) 02/27/2014  . Seasonal and perennial allergic rhinitis 10/25/2013  . Hypertension associated with diabetes ([place]) 10/25/2013  . Hyperlipidemia associated with type 2 diabetes mellitus ([place]) 10/25/2013  . Morbid obesity ([place]) 10/25/2013  . CKD stage 3 due to type 2 diabetes mellitus ([place]) 10/25/2013   Past Medical History:  Past Medical History:  Diagnosis Date  . Anemia, iron deficiency   . Anxiety   . Arthritis   . BPH (benign prostatic hypertrophy)   . CAD (coronary artery disease)    Nonobstructive CAD per cath  . Cardiac pacemaker  in situ   . CHF (congestive heart failure) ([place])   . Chronic ulcer of right foot ([place])   . CKD (chronic kidney disease), stage III ([place]) secondary to DM and HTN   nephrologist-  [name]  . Dyspnea   . History of cellulitis    right great toe 10-25-2014  . History of skin cancer   . HOCM (hypertrophic obstructive cardiomyopathy) ([place])   . Hypertension   . Insulin dependent type 2 diabetes mellitus ([place]) 1991   followd by [name]--  has insulin pump  . Insulin pump in place   . OSA on CPAP   . Peripheral neuropathy    severe  . Peripheral vascular disease ([place])    bilateral lower extremities  . Rib fracture 07/24/2015  . Secondary hyperparathyroidism of renal origin ([place])   . Sinus node dysfunction ([facility])   . Sleep apnea    Past Surgical History:  Past Surgical History:  Procedure Laterality Date  . AMPUTATION OF REPLICATED TOES  Mar 6269   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)  . AMPUTATION TOE Right 03/12/2015   Procedure: RIGHT HALLUS AMPUTATION ;  Surgeon: [name], MD;  Location: [facility];  Service: Podiatry;  Laterality: Right;  . CARDIAC CATHETERIZATION  11-25-2010   [place], [place]   Nonobstructive CAD  . CARDIAC PACEMAKER PLACEMENT  Nov 2009   [employer]  . CHOLECYSTECTOMY N/A 03/05/2017   Procedure: LAPAROSCOPIC CHOLECYSTECTOMY WITH INTRAOPERATIVE CHOLANGIOGRAM;  Surgeon: [name], MD;  [facility];  Service: General;  Laterality: N/A;  .  EP IMPLANTABLE DEVICE N/A 06/03/2015   Procedure: PPM Generator Changeout;  Surgeon: [name], MD;  Location: [facility];  Service: Cardiovascular;  Laterality: N/A;  . EXCISION BONE CYST Right 03/06/2015   Procedure: BONE BIOPSIES OF RIGHT FOOT;  Surgeon: [name], MD;  Location: [facility];  Service: Podiatry;  Laterality: Right;  . ORIF ANKLE FRACTURE Left 11/06/2014   Procedure: OPEN REDUCTION INTERNAL FIXATION (ORIF) LEFT  ANKLE FRACTURE;  Surgeon: [name], MD;   Location: [facility];  Service: Orthopedics;  Laterality: Left;  . TEE WITHOUT CARDIOVERSION N/A 09/01/2017   Procedure: TRANSESOPHAGEAL ECHOCARDIOGRAM (TEE);  Surgeon: [name], MD;  Location: [facility];  Service: Cardiovascular;  Laterality: N/A;  . TOTAL KNEE ARTHROPLASTY    . VEIN LIGATION AND STRIPPING     Social History:  reports that he quit smoking about 33 years ago. He has a 60.00 pack-year smoking history. He has quit using smokeless tobacco. He reports that he drinks about 0.6 oz of alcohol per week. He reports that he does not use drugs.  Family / Support Systems Marital Status: Married How Long?: 65 years Patient Roles: Spouse, Parent(grandparent) Spouse/Significant Other: Sherill Wegener - wife - [phone] Children: Antoine Vandermeulen - son - [phone] (m); [phone] (h); Shelly - local dtr; Colletta [place] - dtr here from [place] Other Supports: extended family Anticipated Caregiver: Wife, daughters Ability/Limitations of Caregiver: Wife can provide supervision, she is limited with physical assistance.  Daughters can also assist some. Caregiver Availability: 24/7 Family Dynamics: supportive family  Social History Preferred language: English Religion: None Cultural Background: Lutheran Education: Chief Financial Officer Read: Yes Write: Yes Employment Status: Retired Date Retired/Disabled/Unemployed: 2009 Age Retired: 41 Public relations account executive Issues: none reported Guardian/Conservator: MD has determined that pt is not capable of making his own decisions at this time.  Pt's wife would be next of kin for decision making.   Abuse/Neglect Abuse/Neglect Assessment Can Be Completed: Yes Physical Abuse: Denies Verbal Abuse: Denies Sexual Abuse: Denies Exploitation of patient/patient's resources: Denies Self-Neglect: Denies  Emotional Status Pt's affect, behavior and adjustment status: Pt reports being in pretty good spirits and is glad that he survived  the stroke and the event in the acute hospital.  He feels it is a "wake up call" to change his lifestyle and get moving again. Recent Psychosocial Issues: Pt reports being "inactive" since his retirement and his difficulty in adjusting to not working and aging, but needing to stay active. Psychiatric History: none reported Substance Abuse History: none reported  Patient / Family Perceptions, Expectations & Goals Pt/Family understanding of illness & functional limitations: Pt/family report a good understanding of pt's condition and limitations. Premorbid pt/family roles/activities: Pt went out almost daily and was still driving.  He used to play basketball, but can't do that due to joint issues.  He likes to play golf, watch you tube, and look at facebook. Anticipated changes in roles/activities/participation: Pt hopes to get more active and would like to be able to play golf again.  He would also like to have outpt PT and maybe a personal trainer or workout at a gym. Pt/family expectations/goals: Pt wants to change his lifestyle and become more active.  Community [employer] Agencies: None Premorbid Home Care/DME Agencies: Other (Comment)(Pt has gone to outpt PT for recovery from a broken ankle.  Pt has a rolling walker; tub bench; bedside commode; and a couple of canes at home.) Transportation available at discharge: family Resource referrals  recommended: Neuropsychology, Support group (specify)(Stroke support group)  Discharge Planning Living Arrangements: Spouse/significant other Support Systems: Spouse/significant other, Children, Other relatives Type of Residence: Private residence Insurance Resources: [employer], Multimedia programmer (specify)([employer]) Financial Resources: Radio broadcast assistant Screen Referred: No Money Management: Patient, Spouse Does the patient have any problems obtaining your medications?: No Home Management: Pt and wife shared  responsibilities. Patient/Family Preliminary Plans: Pt plans to return to his home where his wife and dtrs will provide supervision to light min A.  Wife cannot do more than light min A. Social Work Anticipated Follow Up Needs: HH/OP, Support Group Expected length of stay: 12-16 days  Clinical Impression CSW met with pt to introduce self and role of CSW, as well as to complete assessment.  Then, as CSW was leaving pt's room, his wife and 2 dtrs came in to visit and CSW went over the same information with them.  They are very supportive of pt and plan to take care of pt at home, with hope that pt will be overall supervision level.  CSW will update them on goals and targeted d/c date/estimated LOS after team conference meeting tomorrow.  Pt coping fairly well and feels this is a "wake up" call for him to become more active.  He is grateful for another chance.  Pt reports some gaps in memory and he answered most questions appropriately, but does know he is not completely back to where he was before the stroke.  CSW gave pt's wife a parking pass for CIR parking.  No current concerns/questions/needs at this time, but CSW will continue to follow and assist as needed.  Arthor Gorter, Silvestre Mesi 09/05/2017, 1:34 PM

## 2017-09-05 NOTE — Telephone Encounter (Signed)
Patient is hospitalized. Had 2 strokes-flatlined-brought back. Now in rehab unit. Hospital took him off his Pump. They put him on Lantis for the long term. Novalog sliding scale. Will have to change existing appts-Family will need to take sessions with Leonia Reader to learn how to use the pump. Should [patient see Dr. Dwyane Dee before family starts sessions with Vaughan Basta? Please call [name] at ph# [phone] to advise

## 2017-09-05 NOTE — Evaluation (Signed)
Speech Language Pathology Assessment and Plan  Patient Details  Name: CZAR YSAGUIRRE MRN: [id number] Date of Birth: 1940-09-22  SLP Diagnosis: Cognitive Impairments  Rehab Potential: Good ELOS: 7-14 days     Today's Date: 09/05/2017 SLP Individual Time: 1300-1400 SLP Individual Time Calculation (min): 60 min   Problem List:  Patient Active Problem List   Diagnosis Date Noted  . Ischemic stroke of frontal lobe ([place]) 09/04/2017  . Agitation   . Restless   . Diastolic dysfunction   . Coronary artery disease involving native coronary artery of native heart without angina pectoris   . Cardiac pacemaker in situ   . Stage 3 chronic kidney disease ([place])   . Atrial fibrillation with rapid ventricular response ([place])   . Acute blood loss anemia   . PVD (peripheral vascular disease) ([place])   . Tachypnea   . Acute ischemic stroke ([place]) 08/31/2017  . Lower extremity edema 04/21/2017  . Anxiety 03/22/2017  . Acute cholecystitis s/p lap cholecystectomy 03/05/2017 03/04/2017  . HOCM (hypertrophic obstructive cardiomyopathy) ([name]) 03/04/2017  . 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 Fatigue 01/27/2017  . Low vitamin B12 level 01/27/2017  . Chronic ulcer of right foot ([place]) 08/09/2016  . Syncope 07/24/2015  . 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 OSA on CPAP 10/26/2014  . Back pain 10/16/2014  . Left leg numbness 10/16/2014  . Episodic lightheadedness 08/04/2014  . Open toe wound 07/18/2014  . Diabetes mellitus with neurological manifestations, uncontrolled ([place]) 02/27/2014  . Seasonal and perennial allergic rhinitis 10/25/2013  . Hypertension associated with diabetes ([place]) 10/25/2013  . Hyperlipidemia associated with type 2 diabetes mellitus ([place]) 10/25/2013  . Morbid obesity ([place]) 10/25/2013  . CKD stage 3 due to type 2 diabetes mellitus ([place]) 10/25/2013   Past Medical History:  Past Medical History:  Diagnosis Date  .  Anemia, iron deficiency   . Anxiety   . Arthritis   . BPH (benign prostatic hypertrophy)   . CAD (coronary artery disease)    Nonobstructive CAD per cath  . Cardiac pacemaker in situ   . CHF (congestive heart failure) ([place])   . Chronic ulcer of right foot ([place])   . CKD (chronic kidney disease), stage III ([place]) secondary to DM and HTN   nephrologist-  [name]  . Dyspnea   . History of cellulitis    right great toe 10-25-2014  . History of skin cancer   . HOCM (hypertrophic obstructive cardiomyopathy) ([place])   . Hypertension   . Insulin dependent type 2 diabetes mellitus ([place]) 1991   followd by [name]--  has insulin pump  . Insulin pump in place   . OSA on CPAP   . Peripheral neuropathy    severe  . Peripheral vascular disease ([place])    bilateral lower extremities  . Rib fracture 07/24/2015  . Secondary hyperparathyroidism of renal origin ([place])   . Sinus node dysfunction ([facility])   . Sleep apnea    Past Surgical History:  Past Surgical History:  Procedure Laterality Date  . 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 AMPUTATION TOE Right 03/12/2015   Procedure: RIGHT HALLUS AMPUTATION ;  Surgeon: [name], MD;  Location: [facility];  Service: Podiatry;  Laterality: Right;  . CARDIAC CATHETERIZATION  11-25-2010   [place], [place]   Nonobstructive CAD  . CARDIAC PACEMAKER PLACEMENT  Nov 2009   [employer]  .  CHOLECYSTECTOMY N/A 03/05/2017   Procedure: LAPAROSCOPIC CHOLECYSTECTOMY WITH INTRAOPERATIVE CHOLANGIOGRAM;  Surgeon: [name], MD;  Location: [facility];  Service: General;  Laterality: N/A;  . EP IMPLANTABLE DEVICE N/A 06/03/2015   Procedure: PPM Generator Changeout;  Surgeon: [name], MD;  Location: [facility];  Service: Cardiovascular;  Laterality: N/A;  . EXCISION BONE CYST Right 03/06/2015   Procedure: BONE BIOPSIES OF RIGHT FOOT;  Surgeon: [name], MD;  Location: [facility];   Service: Podiatry;  Laterality: Right;  . ORIF ANKLE FRACTURE Left 11/06/2014   Procedure: OPEN REDUCTION INTERNAL FIXATION (ORIF) LEFT  ANKLE FRACTURE;  Surgeon: [name], MD;  Location: [facility];  Service: Orthopedics;  Laterality: Left;  . TEE WITHOUT CARDIOVERSION N/A 09/01/2017   Procedure: TRANSESOPHAGEAL ECHOCARDIOGRAM (TEE);  Surgeon: [name], MD;  Location: [facility];  Service: Cardiovascular;  Laterality: N/A;  . TOTAL KNEE ARTHROPLASTY    . VEIN LIGATION AND STRIPPING      Assessment / Plan / Recommendation Clinical Impression  Krishawn Vanderweele is a 77 year old right handed male with history of type 2 diabetes mellitus with neuropathy, CAD, HCM s/p PPM, CKD stage III, age-related macular degeneration who was admitted on 08/31/2016 with confusion, left sided numbness and weakness as well as speech difficulty. CT of head was negative for acute changes. MRI of brain done revealing 2 small areas of restricted diffusion in the right frontal cortex compatible with acute infarct. 2D echo done showing EF of 60-65% with trivial pericardial effusion. Cardiology was consulted for input and he underwent TEE revealing normal LVEF, no PFO, no thrombus and fixed plaque in the thoracic aorta. Stroke felt to be embolic due to unknonw source and ASA and Plavix recommended X 3 weeks followed by Plavix alone. Patient o follow up with Dr. Leonie Man and Dr. Caryl Comes after discharge.  He had episode of syncope with fall on 4/20 and lack of pulse. Code initiated with CPR X 1 minute and dose of atropine with return of pulse and back to baseline. PPM interrogated and no A fib noted--syncope fell to be due to hypotension and medications held. He has had few beats of asymptomatic NSVT 4/21 and coreg resumed at lower dose. Low dose torsemide resumed today with recommendations to Monitor BP. Bouts of agitation resolving and family providing supervision for safety? Therapy ongoing with cognitive deficits and  weakness affecting ADLs as well as mobility. CIR recommended due to functional deficits.  Pt adimitted to CIR on 09/04/2017 and evaluated on 09/06/2107 for cognitive linguistic skills. Pt presents with minimum cognitive impairment,with deficits in selective attention, recall of novel information, semi-complex problem solving and error awareness/correction, further exacerbated by slowed progressing. Pt demonstrated improvement in error awareness/correction and delayed recall, with an increase score of 23 out 30 on MOCA version 7.2 (n=.26) compared to a score of 20 out 30 on MOCA blind administered three days earlier. SLP further investigated semi-complex problem solving skills  And error awareness, administering subsection, basic money management of ALFA, pt required min A verbal cues.Pt would benefit from skilled ST services in order to maximize functional independence and reduce burden of care prior to discharge with the possibility to continue skilled St services to reach Mod I.    Skilled Therapeutic Interventions          Skilled ST services focused on cognitive skills. SLP facilitated semi-complex problem solving and error awareness with AFLA basic money management requiring min A verbal cues. Pt  requested education of how/why his stroke have impacted his problem solving and recall, SLP provided explanation, pt stated understanding. SLP and pt discussed problem solving tasks to complete during down time such as crossword puzzles.. Pt was left in room with call bell within reach. Reccomend to continue skilled ST services.    SLP Assessment  Patient will need skilled Speech Lanaguage Pathology Services during CIR admission    Recommendations  SLP Diet Recommendations: Thin Liquid Administration via: Straw;Cup Medication Administration: Whole meds with liquid Supervision: Patient able to self feed Compensations: Minimize environmental distractions;Slow rate;Small sips/bites Postural Changes and/or  Swallow Maneuvers: Seated upright 90 degrees Oral Care Recommendations: Oral care BID Patient destination: Home Follow up Recommendations: Outpatient SLP;24 hour supervision/assistance;Home Health SLP(maybe) Equipment Recommended: None recommended by SLP    SLP Frequency 3 to 5 out of 7 days   SLP Duration  SLP Intensity  SLP Treatment/Interventions 7-14 days   Minumum of 1-2 x/day, 30 to 90 minutes  Cognitive remediation/compensation;Cueing hierarchy;Functional tasks;Environmental controls    Pain Pain Assessment Pain Scale: 0-10 Pain Score: 0-No pain  Prior Functioning Cognitive/Linguistic Baseline: Baseline deficits Baseline deficit details: pt reports mild memory issues Type of Home: House  Lives With: Spouse Available Help at Discharge: Family;Available 24 hours/day Vocation: Retired  Function:  Eating Eating                 Cognition Comprehension Comprehension assist level: Follows complex conversation/direction with extra time/assistive device  Expression   Expression assist level: Expresses complex ideas: With extra time/assistive device  Social Interaction Social Interaction assist level: Interacts appropriately with others - No medications needed.  Problem Solving Problem solving assist level: Solves basic problems with no assist  Memory Memory assist level: Recognizes or recalls 75 - 89% of the time/requires cueing 10 - 24% of the time   Short Term Goals: Week 1: SLP Short Term Goal 1 (Week 1): Pt will utilize external memory aids to recall new, daily information with supervision A verbal and question cues. SLP Short Term Goal 2 (Week 1): Pt will demonstrate selective attention in moderately distracting environments for 30 minutes with supervision A verbal and question cues. SLP Short Term Goal 3 (Week 1): Pt will complete semi-complex tasks with supervision A verbal cues for functional problem solving.  SLP Short Term Goal 4 (Week 1): Pt will  self-monitor and correct functional errors in problem solving tasks with supervision A verbal cues.  Refer to Care Plan for Long Term Goals  Recommendations for other services: None   Discharge Criteria: Patient will be discharged from SLP if patient refuses treatment 3 consecutive times without medical reason, if treatment goals not met, if there is a change in medical status, if patient makes no progress towards goals or if patient is discharged from hospital.  The above assessment, treatment plan, treatment alternatives and goals were discussed and mutually agreed upon: by patient  Kellene Mccleary  [facility] 09/05/2017, 4:09 PM

## 2017-09-05 NOTE — Progress Notes (Signed)
[facility] PHYSICAL MEDICINE & REHABILITATION     PROGRESS NOTE    Subjective/Complaints: No major issues overnight. Anxious to get moving with therapies today.   ROS: Patient denies fever, rash, sore throat, blurred vision, nausea, vomiting, diarrhea, cough, shortness of breath or chest pain, joint or back pain, headache, or mood change.   Objective: Vital Signs: Blood pressure (!) 125/53, pulse 76, temperature 98.9 F (37.2 C), temperature source Oral, resp. rate 16, weight 128.8 kg (284 lb), SpO2 96 %. Dg Knee 1-2 Views Left  Result Date: 09/04/2017 CLINICAL DATA:  77 year old male with generalized left knee pain for 2 months. No injury. Initial encounter. EXAM: LEFT KNEE - 1-2 VIEW COMPARISON:  None. FINDINGS: Marked medial tibiofemoral joint space narrowing and degenerative changes. Mild patellofemoral joint degenerative changes. Small to moderate-size suprapatellar joint effusion. No fracture or dislocation. Vascular calcifications. IMPRESSION: Marked medial tibiofemoral joint degenerative changes. Mild patellofemoral joint degenerative changes. Small to moderate-size suprapatellar joint effusion. Electronically Signed   By: [name]   On: 09/04/2017 17:55   Recent Labs    09/03/17 0030 09/05/17 0520  WBC 7.2 6.2  HGB 11.0* 10.4*  HCT 33.9* 31.8*  PLT 170 156   Recent Labs    09/03/17 0030 09/05/17 0520  NA 138 135  K 4.2 4.0  CL 106 104  GLUCOSE 147* 176*  BUN 52* 53*  CREATININE 2.39* 2.39*  CALCIUM 8.4* 8.7*   CBG (last 3)  Recent Labs    09/04/17 1741 09/04/17 2101 09/05/17 0706  GLUCAP 216* 274* 167*    Wt Readings from Last 3 Encounters:  09/05/17 128.8 kg (284 lb)  08/30/17 130.2 kg (287 lb)  08/17/17 130.2 kg (287 lb)    Physical Exam:  Constitutional: No distress . Vital signs reviewed. obese HEENT: EOMI, oral membranes moist Cardiovascular: RRR without murmur. No JVD    Respiratory: CTA Bilaterally without wheezes or rales. Normal  effort    GI: BS +, non-tender, non-distended   Musculoskeletal: He exhibits edema.    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 BLE with min edema and woody appearing stasis changes. Left knee with effusion, crepitus.  Neurological: He is alert. A cranial nerve deficit is present.  Left central 7.  Minimal dysarthria. Oriented to self and place. Improving awareness. asked when therapy starts this morning.  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 4/5 LUE , LLE 3+/5 prox to 4- distally, somewhat limited to knee pain. RUE and RLE 4+/5. Distal sensory loss in both feet  Skin: Skin is warm and dry. He is not diaphoretic.  Psychiatric:pleasant, some delays in processing.      Assessment/Plan: 1. 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 Physiatrist is providing close team supervision and 24 hour management of active medical problems listed below. Physiatrist and rehab team continue to assess barriers to discharge/monitor patient progress toward functional and medical goals.  Function:  Bathing Bathing position      Bathing parts      Bathing assist        Upper Body Dressing/Undressing Upper body dressing                    Upper body assist        Lower Body Dressing/Undressing Lower body dressing  Lower body assist        Toileting Toileting          Toileting assist     Transfers Chair/bed Clinical biochemist          Cognition Comprehension    Expression    Social Interaction    Problem Solving    Memory     Medical Problem List and Plan:  1. 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 DVT Prophylaxis/Anticoagulation: Pharmaceutical: Lovenox  3. Pain Management/OA left knee  -tylenol or tramadol  - prn. Ice   -voltaren gel  -knee xray shows significant medial joint OA, patello femoral OA   -strengthening exercises, bracing if needed for support  4. Mood: LCSW to follow for evaluation and support. Mood stable on home dose Zoloft.  5. Neuropsych: This patient is not capable of making decisions on his own behalf.  6. Skin/Wound Care: routine pressure relief measures.  7. Fluids/Electrolytes/Nutrition:   -I personally reviewed the patient's labs today.   8. HCM/PPM: Recent syncope with lack of pulse---monitor orthostatic vitals. Continue low dose coreg.  9. Chronic diastolic CHF: Heart healthy diet with daily weights. Lisinopril and Cardura on hold to prevent recurrent syncope/hypotension. Back on torsemide. Continue ASA, Lipitor  and coreg. Monitor for signs of fluid overload. Will order TEDs to help manage chronic peripheral edema.    - Filed Weights   09/04/17 1655 09/05/17 0545  Weight: 129 kg (284 lb 6.3 oz) 128.8 kg (284 lb)    10 T2DM with neuropathy and nephropathy: Was on insulin pump at home but currently not competent to use it. Monitor BS ac/hs and follow for pattern .   -Continue lantus insulin with meal coverage---titrate as needed   -follow up with Dr. Dwyane Dee after discharge for instructions on resumption of pump  11. OSA: Continue CPAP at nights/when asleep.  12. Asymptomatic tachycardia/NSVT: Monitor for symptoms. Coreg titrated up to home dose today.   -hgb stable at 10.4     LOS (Days) 1 A FACE TO FACE EVALUATION WAS PERFORMED  Meredith Staggers, MD 09/05/2017 8:52 AM

## 2017-09-06 ENCOUNTER — Inpatient Hospital Stay (HOSPITAL_COMMUNITY): Payer: Medicare Other | Admitting: Physical Therapy

## 2017-09-06 ENCOUNTER — Inpatient Hospital Stay (HOSPITAL_COMMUNITY): Payer: Medicare Other

## 2017-09-06 ENCOUNTER — Inpatient Hospital Stay (HOSPITAL_COMMUNITY): Payer: Medicare Other | Admitting: *Deleted

## 2017-09-06 ENCOUNTER — Inpatient Hospital Stay (HOSPITAL_COMMUNITY): Payer: Medicare Other | Admitting: Occupational Therapy

## 2017-09-06 ENCOUNTER — Encounter (HOSPITAL_COMMUNITY): Payer: Medicare Other | Admitting: Psychology

## 2017-09-06 LAB — GLUCOSE, CAPILLARY
Glucose-Capillary: 167 mg/dL — ABNORMAL HIGH (ref 65–99)
Glucose-Capillary: 191 mg/dL — ABNORMAL HIGH (ref 65–99)
Glucose-Capillary: 133 mg/dL — ABNORMAL HIGH (ref 65–99)
Glucose-Capillary: 167 mg/dL — ABNORMAL HIGH (ref 65–99)

## 2017-09-06 MED ORDER — INSULIN GLARGINE 100 UNIT/ML ~~LOC~~ SOLN
18.0000 [IU] | Freq: Two times a day (BID) | SUBCUTANEOUS | Status: DC
  Administered 2017-09-06 – 2017-09-12 (×12): 18 [IU] via SUBCUTANEOUS
  Filled 2017-09-06 (×13): qty 0.18

## 2017-09-06 NOTE — Progress Notes (Signed)
[facility] PHYSICAL MEDICINE & REHABILITATION     PROGRESS NOTE    Subjective/Complaints: Up with PT. No new complaints. Left knee actually feeling better. Slept well  ROS: Patient denies fever, rash, sore throat, blurred vision, nausea, vomiting, diarrhea, cough, shortness of breath or chest pain, joint or back pain, headache, or mood change.   Objective: Vital Signs: Blood pressure (!) 156/42, pulse 77, temperature 99 F (37.2 C), temperature source Oral, resp. rate 18, weight 128.4 kg (283 lb), SpO2 94 %. Dg Knee 1-2 Views Left  Result Date: 09/04/2017 CLINICAL DATA:  77 year old male with generalized left knee pain for 2 months. No injury. Initial encounter. EXAM: LEFT KNEE - 1-2 VIEW COMPARISON:  None. FINDINGS: Marked medial tibiofemoral joint space narrowing and degenerative changes. Mild patellofemoral joint degenerative changes. Small to moderate-size suprapatellar joint effusion. No fracture or dislocation. Vascular calcifications. IMPRESSION: Marked medial tibiofemoral joint degenerative changes. Mild patellofemoral joint degenerative changes. Small to moderate-size suprapatellar joint effusion. Electronically Signed   By: [name]   On: 09/04/2017 17:55   Recent Labs    09/05/17 0520  WBC 6.2  HGB 10.4*  HCT 31.8*  PLT 156   Recent Labs    09/05/17 0520  NA 135  K 4.0  CL 104  GLUCOSE 176*  BUN 53*  CREATININE 2.39*  CALCIUM 8.7*   CBG (last 3)  Recent Labs    09/05/17 1628 09/05/17 2105 09/06/17 0652  GLUCAP 200* 233* 167*    Wt Readings from Last 3 Encounters:  09/06/17 128.4 kg (283 lb)  08/30/17 130.2 kg (287 lb)  08/17/17 130.2 kg (287 lb)    Physical Exam:  Constitutional: No distress . Vital signs reviewed. obese HEENT: EOMI, oral membranes moist Cardiovascular: RRR without murmur. No JVD    Respiratory: CTA Bilaterally without wheezes or rales. Normal effort    GI: BS +, non-tender, non-distended  Musculoskeletal: He exhibits  edema.    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 BLE with min edema and woody appearing stasis changes. Left knee with crepitus Neurological: He is alert. A cranial nerve deficit is present.  Left central 7.  Minimal dysarthria. Oriented to self and place. Improving awareness. asked when therapy starts this morning.  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 4/5 LUE , LLE 3+/5 prox to 4- distally, somewhat limited to knee pain. RUE and RLE 4+/5. Distal sensory loss in both feet present Skin: Skin is warm and dry. He is not diaphoretic.  Psychiatric:pleasant, some delays in processing.      Assessment/Plan: 1. 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 Physiatrist is providing close team supervision and 24 hour management of active medical problems listed below. Physiatrist and rehab team continue to assess barriers to discharge/monitor patient progress toward functional and medical goals.  Function:  Bathing Bathing position Bathing activity did not occur: Refused    Bathing parts      Bathing assist        Upper Body Dressing/Undressing Upper body dressing Upper body dressing/undressing activity did not occur: Refused                  Upper body assist        Lower Body Dressing/Undressing Lower body dressing Lower body dressing/undressing activity did not occur: Refused  Lower body assist        Toileting Toileting Toileting activity did not occur: Refused        Toileting assist     Transfers Chair/bed transfer   Chair/bed transfer method: Ambulatory Chair/bed transfer assist level: Supervision or verbal cues Chair/bed transfer assistive device: Medical sales representative     Max distance: 200' Assist level: Supervision or verbal cues   Wheelchair   Type: Manual Max wheelchair distance: 139ft w/  BLE Assist Level: Supervision or verbal cues  Cognition Comprehension Comprehension assist level: Follows complex conversation/direction with extra time/assistive device  Expression Expression assist level: Expresses complex ideas: With extra time/assistive device  Social Interaction Social Interaction assist level: Interacts appropriately with others - No medications needed.  Problem Solving Problem solving assist level: Solves basic problems with no assist  Memory Memory assist level: Recognizes or recalls 75 - 89% of the time/requires cueing 10 - 24% of the time   Medical Problem List and Plan:  1. Functional deficits secondary to right frontal CVA  -team conference today 2. DVT Prophylaxis/Anticoagulation: Pharmaceutical: Lovenox  3. Pain Management/OA left knee ---improved -tylenol or tramadol  - prn. Ice  -voltaren gel  --strengthening exercises, bracing if needed for support  4. Mood: LCSW to follow for evaluation and support. Mood stable on home dose Zoloft.  5. Neuropsych: This patient is not capable of making decisions on his own behalf.  6. Skin/Wound Care: routine pressure relief measures.  7. Fluids/Electrolytes/Nutrition:   -encourage PO   8. HCM/PPM: Recent syncope with lack of pulse---monitor orthostatic vitals. Continue low dose coreg.  9. Chronic diastolic CHF: Heart healthy diet with daily weights. Lisinopril and Cardura on hold to prevent recurrent syncope/hypotension. Back on torsemide. Continue ASA, Lipitor  and coreg. Monitor for signs of fluid overload.    - TEDs to help manage chronic peripheral edema.    -weights stable Filed Weights   09/04/17 1655 09/05/17 0545 09/06/17 0417  Weight: 129 kg (284 lb 6.3 oz) 128.8 kg (284 lb) 128.4 kg (283 lb)    10 T2DM with neuropathy and nephropathy: Was on insulin pump at home but currently not competent to use it. Monitor BS ac/hs and follow for pattern .   -Continue lantus insulin with meal coverage--increase lantus  to 18u bid   -continue 4u novolog tid   -follow up with Dr. Dwyane Dee after discharge for instructions on resumption of pump  11. OSA: Continue CPAP at nights/when asleep.  12. Asymptomatic tachycardia/NSVT: Monitor for symptoms. Coreg titrated up to home dose today.   -hgb stable at 10.4     LOS (Days) 2 A FACE TO FACE EVALUATION WAS PERFORMED  Meredith Staggers, MD 09/06/2017 9:01 AM

## 2017-09-06 NOTE — Progress Notes (Signed)
Physical Therapy Session Note  Patient Details  Name: David [name] MRN: [id number] Date of Birth: 04/30/1941  Today's Date: 09/06/2017 PT Individual Time: 0800-0900 PT Individual Time Calculation (min): 60 min   Short Term Goals: Week 1:  PT Short Term Goal 1 (Week 1): STG = LTG due to short ELOS.  Skilled Therapeutic Interventions/Progress Updates:    Pt seated in recliner in room, agreeable to PT. No complaints of pain. Sit to stand with SBA to RW. Ambulation x 300 ft with RW and SBA. Trial ambulation with SBQC x 150 ft, CGA. Pt reports not feeling as comfortable with SBQC as he does with cane he normally uses at home, pt's wife to bring in his cane to trial. Trial ambulation x 150 ft with no AD and CGA for balance, occasional scissoring of gait pattern but overall pt demos good balance without use of AD. Ascend/descend 8 stairs x 2 with one handrail and min assist, v/c for safety and for foot placement on steps. Manual w/c propulsion x 150 ft with use of BLE, verbal cues to attend to obstacles as pt runs into several obstacles in left visual field. Nustep level 3 x 5 min with B UE/LE for global strengthening and endurance. Pt left seated in recliner in room with needs in reach, quick release belt and chair alarm in place.  Therapy Documentation Precautions:  Precautions Precautions: Fall Restrictions Weight Bearing Restrictions: No  See Function Navigator for Current Functional Status.   Therapy/Group: Individual Therapy  Excell Seltzer, PT, DPT  09/06/2017, 12:11 PM

## 2017-09-06 NOTE — Consult Note (Signed)
Neuropsychological Consultation   Patient:   David [name]   DOB:   Sep 15, 1940  MR Number:  [id number]  Location:  [facility] [facility] [address] [id number] [name] [address] Dept: [phone] XIP: [phone]           Date of Service:   09/06/2017  Start Time:   11 AM End Time:   12 PM  Provider/Observer:  Ilean Skill, Psy.D.       Clinical Neuropsychologist       Billing Code/Service: [phone] Units  Chief Complaint:    David [name] is a 77 year old male with history of T2DM with neuropathy, CAD, HCM s/p PPM, CKD state III, age-related macular degeneration.  He was admitted on 08/31/2017 with confusion, left sided numbness, difficulty with speech and weakness.  MRI revealed 2 small areas of restricted diffusion in the right frontal cortex consistent with acute infarct.  Patient has had fall due to hypotensive event.  The patient's mood is good.  Cognitive function is improving, although memory related to retrieval deficits noted.  Reason for Service:  Mr. Deboy was referred for neuropsychological consultation due to coping and adjustment issues as well as to assess current cognitive functioning.  Below is the HPI for the current admission.  HPI: David [name] is a 77 year old right handed male with history of type 2 diabetes mellitus with neuropathy, CAD, HCM s/p PPM, CKD stage III, age-related macular degeneration who was admitted on 08/31/2016 with confusion, left sided numbness and weakness as well as speech difficulty. CT of head was negative for acute changes. MRI of brain done revealing 2 small areas of restricted diffusion in the right frontal cortex compatible with acute infarct. 2D echo done showing EF of 60-65% with trivial pericardial effusion. Cardiology was consulted for input and he underwent TEE revealing normal LVEF, no PFO, no thrombus and fixed plaque in the thoracic aorta. Stroke  felt to be embolic due to unknonw source and ASA and Plavix recommended X 3 weeks followed by Plavix alone. Patient o follow up with Dr. Leonie Man and Dr. Caryl Comes after discharge.  He had episode of syncope with fall on 4/20 and lack of pulse. Code initiated with CPR X 1 minute and dose of atropine with return of pulse and back to baseline. PPM interrogated and no A fib noted--syncope fell to be due to hypotension and medications held. He has had few beats of asymptomatic NSVT 4/21 and coreg resumed at lower dose. Low dose torsemide resumed today with recommendations to Monitor BP. Bouts of agitation resolving and family providing supervision for safety? Therapy ongoing with cognitive deficits and weakness affecting ADLs as well as mobility. CIR recommended due to functional deficits.   Current Status:  The patient did well on mental status exam with the exception for free recall of information.  He did improve with cueing.  Orientation was good.  X4.  The patient's mood was good and he denied depressive or anxiety type symptoms.  He reports that he was adapting better to extended hospital stay although he looks forward to returning home.    Behavioral Observation: David [name]  presents as a 77 y.o.-year-old Right Caucasian Male who appeared his stated age. his dress was Appropriate and he was Well Groomed and his manners were Appropriate to the situation.  his participation was indicative of Appropriate and Attentive behaviors.  There were any physical disabilities noted.  he displayed  an appropriate level of cooperation and motivation.     Interactions:    Active Appropriate and Attentive  Attention:   within normal limits and attention span and concentration were age appropriate  Memory:   abnormal; remote memory intact, recent memory impaired, patient's memory issues were due mostly due to retrieval deficits and was able to attend, comprehend and store new information.  Cueing helped significantly  with recall.  Visuo-spatial:  not examined  Speech (Volume):  normal  Speech:   normal; normal  Thought Process:  Coherent and Relevant  Though Content:  WNL; not suicidal and not homicidal  Orientation:   person, place, time/date and situation  Judgment:   Good  Planning:   Good  Affect:    Appropriate  Mood:    NA  Insight:   Good  Intelligence:   normal  Medical History:   Past Medical History:  Diagnosis Date  . Anemia, iron deficiency   . Anxiety   . Arthritis   . BPH (benign prostatic hypertrophy)   . CAD (coronary artery disease)    Nonobstructive CAD per cath  . Cardiac pacemaker in situ   . CHF (congestive heart failure) ([place])   . Chronic ulcer of right foot ([facility])   . CKD (chronic kidney disease), stage III ([place]) secondary to DM and HTN   nephrologist-  [name]  . Dyspnea   . History of cellulitis    right great toe 10-25-2014  . History of skin cancer   . HOCM (hypertrophic obstructive cardiomyopathy) ([facility])   . Hypertension   . Insulin dependent type 2 diabetes mellitus ([facility]) 1991   followd by [name]--  has insulin pump  . Insulin pump in place   . OSA on CPAP   . Peripheral neuropathy    severe  . Peripheral vascular disease ([facility])    bilateral lower extremities  . Rib fracture 07/24/2015  . Secondary hyperparathyroidism of renal origin ([place])   . Sinus node dysfunction ([facility])   . Sleep apnea    Psychiatric History:  Patient does have history of anxiety but denies any significant change or exacerbation.    Family Med/Psych History:  Family History  Problem Relation Age of Onset  . Cancer Mother        breast  . Heart attack Father    Impression/DX:  David [name] is a 77 year old male with history of T2DM with neuropathy, CAD, HCM s/p PPM, CKD state III, age-related macular degeneration.  He was admitted on 08/31/2017 with confusion, left sided numbness, difficulty with speech and weakness.  MRI revealed 2 small areas of restricted  diffusion in the right frontal cortex consistent with acute infarct.  Patient has had fall due to hypotensive event.  The patient's mood is good.  Cognitive function is improving, although memory related to retrieval deficits noted.  The patient did well on mental status exam with the exception for free recall of information.  He did improve with cueing.  Orientation was good.  X4.  The patient's mood was good and he denied depressive or anxiety type symptoms.  He reports that he was adapting better to extended hospital stay although he looks forward to returning home.  remote memory intact, recent memory impaired, patient's memory issues were due mostly due to retrieval deficits and was able to attend, comprehend and store new information.  Cueing helped significantly with recall.         Electronically Signed   _______________________ Ilean Skill, Psy.D.

## 2017-09-06 NOTE — Progress Notes (Signed)
Occupational Therapy Session Note  Patient Details  Name: David [name] MRN: [id number] Date of Birth: 1940/10/20  Today's Date: 09/06/2017 OT Individual Time: 1400-1455 OT Individual Time Calculation (min): 55 min    Short Term Goals: Week 1:  OT Short Term Goal 1 (Week 1): STG=LTG d/t ELOS  Skilled Therapeutic Interventions/Progress Updates:    Pt seen for OT session focusing on functional ambulation, sit>stand, and activity tolerance. Pt sitting up in recliner upon arrival, denied pain, however, voicing increased  Fatigue and soreness from PT session. He was agreeable to tx session- reported completing bathing/dressing prior to session. Education provided regarding role of OT and POC. Pt desiring to go outside this session, desiring fresh air. He ambulated pushing w/c for support with CGA to elevators. He then self propelled w/c off unit for LE strengthening.  Outside, he completed functional ambulation over uneven terrain including bricks and sidewalks with overall CGA. Occassionally min A due to LOB and VCs for safety awareness. Pt tolerating ~ 27ft of ambulation before requesting seated rest break. Completed x3 trials throughout session. During rest breaks, discussed d/c planning, home layout, continuum of care, etc. Attempted to have pt complete sit >stand without UE support from park bench, however, pt unable to power up by leg strength only. Completed x2 sets of 5 reps using L UE to assist with CGA and rest breaks required throughout. Pt returned to unit total A in w/c. Requested return to supine. Pt left in supine with all needs in reach, bed alarm on, made aware of need for use of call bell for any mobility needs.   Therapy Documentation Precautions:  Precautions Precautions: Fall Restrictions Weight Bearing Restrictions: No Pain:   No/denies pain ADL: ADL ADL Comments: See functional navigator  See Function Navigator for Current Functional Status.   Therapy/Group:  Individual Therapy  Quita Mcgrory L 09/06/2017, 7:22 AM

## 2017-09-06 NOTE — Progress Notes (Signed)
Speech Language Pathology Daily Session Note  Patient Details  Name: David [name] MRN: [id number] Date of Birth: Oct 03, 1940  Today's Date: 09/06/2017 SLP Individual Time: 0930-1000 SLP Individual Time Calculation (min): 30 min  Short Term Goals: Week 1: SLP Short Term Goal 1 (Week 1): Pt will utilize external memory aids to recall new, daily information with supervision A verbal and question cues. SLP Short Term Goal 2 (Week 1): Pt will demonstrate selective attention in moderately distracting environments for 30 minutes with supervision A verbal and question cues. SLP Short Term Goal 3 (Week 1): Pt will complete semi-complex tasks with supervision A verbal cues for functional problem solving.  SLP Short Term Goal 4 (Week 1): Pt will self-monitor and correct functional errors in problem solving tasks with supervision A verbal cues.  Skilled Therapeutic Interventions:Skilled ST services focused on cognitive skills. SLP facilitated semi-complex problem solving skills and error awareness/correction with ALFA daily math problems and medication management pt required min-supervision A verbal cues. Pt was left in room with call bell within reach. Reccomend to continue skilled ST services.     Function:  Eating Eating   Modified Consistency Diet: No Eating Assist Level: No help, No cues           Cognition Comprehension Comprehension assist level: Follows complex conversation/direction with extra time/assistive device  Expression   Expression assist level: Expresses complex ideas: With extra time/assistive device  Social Interaction Social Interaction assist level: Interacts appropriately with others - No medications needed.  Problem Solving Problem solving assist level: Solves complex 90% of the time/cues < 10% of the time;Solves basic problems with no assist  Memory Memory assist level: Recognizes or recalls 75 - 89% of the time/requires cueing 10 - 24% of the time    Pain Pain  Assessment Pain Score: 0-No pain  Therapy/Group: Individual Therapy  Bentlie Catanzaro  [facility] 09/06/2017, 5:01 PM

## 2017-09-06 NOTE — Progress Notes (Signed)
Physical Therapy Session Note  Patient Details  Name: David [name] MRN: [id number] Date of Birth: 1941-03-13  Today's Date: 09/06/2017 PT Individual Time: 9012-2241 PT Individual Time Calculation (min): 45 min   Short Term Goals: Week 1:  PT Short Term Goal 1 (Week 1): STG = LTG due to short ELOS.  Skilled Therapeutic Interventions/Progress Updates:   Pt received in w/c, wife and two daughters present, agreeable to therapy. Pt w/ no c/o pain. Pt ambulated w/ RW and supervision to gym, demonstrating decreased bilateral heel strike. Pt's BP in sitting 132/60, in standing 113/56, and standing after 3 minutes 133/50, pt denies dizziness or lightheadedness. Pt educated on floor transfer and safety precautions should a fall occur when he discharges home. Pt performed floor transfer with supervision and verbal cuing for technique and sequencing. Pt ambulated w/ personal tripod cane x100 ft and states he feels most comfortable with this device. Pt performed 10 sit<>stands w/o UE for BLE strengthening and endurance. Pt demonstrated decreased eccentric control on descent and increased work of breathing. Pt O2 sat 98% and pulse 77bpm. Pt performed TUG and scored 19.69s on average of three trials w/ tripod cane. Pt educated on meaning of score and discharge recommendations to use cane at all times. Pt engaged in ball taps with weighted bar in standing to increase activity tolerance and static standing balance. Pt w/ 1 slight LOB but able to self correct. At end of session, pt left seated in w/c, quick release belt on and family present.  Therapy Documentation Precautions:  Precautions Precautions: Fall Restrictions Weight Bearing Restrictions: No  See Function Navigator for Current Functional Status.   Therapy/Group: Individual Therapy  Caffie Damme 09/06/2017, 4:10 PM

## 2017-09-07 ENCOUNTER — Inpatient Hospital Stay (HOSPITAL_COMMUNITY): Payer: Medicare Other | Admitting: Occupational Therapy

## 2017-09-07 ENCOUNTER — Inpatient Hospital Stay (HOSPITAL_COMMUNITY): Payer: Medicare Other | Admitting: Physical Therapy

## 2017-09-07 ENCOUNTER — Inpatient Hospital Stay (HOSPITAL_COMMUNITY): Payer: Medicare Other | Admitting: Speech Pathology

## 2017-09-07 LAB — GLUCOSE, CAPILLARY
Glucose-Capillary: 168 mg/dL — ABNORMAL HIGH (ref 65–99)
Glucose-Capillary: 135 mg/dL — ABNORMAL HIGH (ref 65–99)
Glucose-Capillary: 129 mg/dL — ABNORMAL HIGH (ref 65–99)
Glucose-Capillary: 171 mg/dL — ABNORMAL HIGH (ref 65–99)

## 2017-09-07 NOTE — IPOC Note (Signed)
Overall Plan of Care [facility]) Patient Details Name: David [name] MRN: [id number] DOB: [birth year]  Admitting Diagnosis: <principal problem not specified> right frontal infarct  Hospital Problems: Active Problems:   Ischemic stroke of frontal lobe ([place])     Functional Problem List: Nursing Bowel, Endurance, Medication Management, Edema, Motor, Safety, Sensory  PT Balance, Safety, Behavior, Endurance, Motor, Sensory  OT Balance, Endurance, Perception, Safety, Cognition, Edema  SLP Cognition  TR         Basic ADL's: OT Toileting, Bathing, Dressing     Advanced  ADL's: OT Light Housekeeping, Simple Meal Preparation     Transfers: PT Bed Mobility, Bed to Chair, Car, Floor, Manufacturing systems engineer, Metallurgist: PT Ambulation, Stairs     Additional Impairments: OT    SLP Social Cognition   Problem Solving, Memory, Attention, Awareness  TR      Anticipated Outcomes Item Anticipated Outcome  Self Feeding    Swallowing      Basic self-care  mod I  Toileting  mod I   Bathroom Transfers mod I  Bowel/Bladder  mod I remain continent bowel and bladder  Transfers  supervision<>mod I with LRAD  Locomotion  supervision<>mod I with LRAD  Communication     Cognition  Mod I  Pain  N/A  Safety/Judgment  mod I    Therapy Plan: PT Intensity: Minimum of 1-2 x/day ,45 to 90 minutes PT Frequency: 5 out of 7 days PT Duration Estimated Length of Stay: 5-7 days OT Intensity: Minimum of 1-2 x/day, 45 to 90 minutes OT Frequency: 5 out of 7 days OT Duration/Estimated Length of Stay: 5-7 days SLP Intensity: Minumum of 1-2 x/day, 30 to 90 minutes SLP Frequency: 3 to 5 out of 7 days SLP Duration/Estimated Length of Stay: 7-14 days     Team Interventions: Nursing Interventions Patient/Family Education, Bowel Management, Disease Management/Prevention, Medication Management  PT interventions Ambulation/gait training, Discharge planning, Functional mobility training,  DME/adaptive equipment instruction, Psychosocial support, Splinting/orthotics, Therapeutic Activities, UE/LE Strength taining/ROM, Training and development officer, Community reintegration, Disease management/prevention, Neuromuscular re-education, Barrister's clerk education, IT trainer, Therapeutic Exercise, UE/LE Coordination activities, Skin care/wound management, Visual/perceptual remediation/compensation, Cognitive remediation/compensation  OT Interventions Training and development officer, Cognitive remediation/compensation, Academic librarian, Discharge planning, Disease mangement/prevention, Engineer, drilling, Functional mobility training, Patient/family education, Self Care/advanced ADL retraining, Therapeutic Exercise, Therapeutic Activities, UE/LE Strength taining/ROM, UE/LE Coordination activities  SLP Interventions Cognitive remediation/compensation, Cueing hierarchy, Functional tasks, Environmental controls  TR Interventions    SW/CM Interventions Discharge Planning, Psychosocial Support, Patient/Family Education   Barriers to Discharge MD  Medical stability  Nursing      PT      OT      SLP      SW       Team Discharge Planning: Destination: PT-Home ,OT- Home , SLP-Home Projected Follow-up: PT-Home health PT(intermittent supervision), OT-  None, SLP-Outpatient SLP, 24 hour supervision/assistance, Home Health SLP(maybe) Projected Equipment Needs: PT-To be determined(LRAD), OT- To be determined, SLP-None recommended by SLP Equipment Details: PT- , OT-  Patient/family involved in discharge planning: PT- Patient,  OT-Patient, SLP-Patient  MD ELOS: 7 days Medical Rehab Prognosis:  Excellent Assessment: The patient has been admitted for CIR therapies with the diagnosis of right frontal infarct, osteoarthritis left knee. The team will be addressing functional mobility, strength, stamina, balance, safety, adaptive techniques and equipment, self-care, bowel and bladder  mgt, patient and caregiver education, neuromuscular reeducation, safety awareness and cognition, pain management, vestibular assessment and treat,.  Reentry. Goals have  been set at March modified independent for basic mobility, self-care, cognition.Meredith Staggers, MD, FAAPMR      See Team Conference Notes for weekly updates to the plan of care

## 2017-09-07 NOTE — Progress Notes (Signed)
Occupational Therapy Session Note  Patient Details  Name: David [name] MRN: [id number] Date of Birth: 10-22-1940  Today's Date: 09/07/2017 OT Individual Time: 3154-0086 OT Individual Time Calculation (min): 55 min    Short Term Goals: Week 1:  OT Short Term Goal 1 (Week 1): STG=LTG d/t ELOS  Skilled Therapeutic Interventions/Progress Updates:    Pt seen for OT ADL bathing/dressing session and IADL re-training. Pt sitting up in w/c upon arrival with NT present assessing vitals. Pt agreeable to tx session and bathing at shower level today. He ambulated throughout room using cane, occasional steadying assist with VCs for safety awareness. He bathed seated on tub bench with distant supervision. Returned to w/c to dress, standing without AD to complete LB clothing management with supervision. He then ambulated back into bathroom to pick up dirty towels from floor, heavy steadying assist required when bending down to pick up items and VCs for safety awareness.  Discussed pt's PLOF and return to IADLs. Pt reports he will complete laundry tasks at d/c. Therefore, had pt ambulate throughout unit to pt laundry facility. Pt practiced placing and removing items from top and front loader washer/dryer with supervision. Required seated rest break before standing to fold towels. He ambulated back to room holding towels in one hand and managing cane in other hand.  Pt left seated in w/c at end of session, QRB and chair alarm on, family members present.   Therapy Documentation Precautions:  Precautions Precautions: Fall Restrictions Weight Bearing Restrictions: No Pain:   No/denies pain ADL: ADL ADL Comments: See functional navigator  See Function Navigator for Current Functional Status.   Therapy/Group: Individual Therapy  Everett Ehrler L 09/07/2017, 7:14 AM

## 2017-09-07 NOTE — Progress Notes (Signed)
Occupational Therapy Session Note  Patient Details  Name: David [name] MRN: [id number] Date of Birth: January 19, 1941  Today's Date: 09/07/2017 OT Individual Time: 0930-1015 OT Individual Time Calculation (min): 45 min    Short Term Goals: Week 1:  OT Short Term Goal 1 (Week 1): STG=LTG d/t ELOS  Skilled Therapeutic Interventions/Progress Updates:    Upon entering the room, pt seated in wheelchair with no c/o pain. Pt requesting to work on nustep. Pt ambulating with RW 100' to day room with supervision and on nustep with level 1 resistance for 10 minutes to increase endurance and strength. Pt engaged in dynavision task to challenge balance while standing on foam wedge for 5 minutes. Pt without LOB during tasks and able to engage in task with one or no UE support. Pt ambulating back to room in same manner as above. Pt seated in wheelchair with quick release belt donned and call bell within reach.   Therapy Documentation Precautions:  Precautions Precautions: Fall Restrictions Weight Bearing Restrictions: No General:   Vital Signs:  Pain: Pain Assessment Pain Scale: 0-10 Pain Score: 0-No pain ADL: ADL ADL Comments: See functional navigator   See Function Navigator for Current Functional Status.   Therapy/Group: Individual Therapy  Gypsy Decant 09/07/2017, 12:17 PM

## 2017-09-07 NOTE — Progress Notes (Signed)
[facility] PHYSICAL MEDICINE & REHABILITATION     PROGRESS NOTE    Subjective/Complaints: Patient up sitting in chair.  No new issues.  Feels stronger.  Happy that his left knee is not bothering him a great deal  ROS: Patient denies fever, rash, sore throat, blurred vision, nausea, vomiting, diarrhea, cough, shortness of breath or chest pain, joint or back pain, headache, or mood change. .   Objective: Vital Signs: Blood pressure (!) 131/47, pulse 70, temperature 98.5 F (36.9 C), temperature source Oral, resp. rate (!) 22, weight 128.4 kg (283 lb 0.1 oz), SpO2 94 %. No results found. Recent Labs    09/05/17 0520  WBC 6.2  HGB 10.4*  HCT 31.8*  PLT 156   Recent Labs    09/05/17 0520  NA 135  K 4.0  CL 104  GLUCOSE 176*  BUN 53*  CREATININE 2.39*  CALCIUM 8.7*   CBG (last 3)  Recent Labs    09/06/17 1655 09/06/17 2107 09/07/17 0634  GLUCAP 133* 167* 168*    Wt Readings from Last 3 Encounters:  09/07/17 128.4 kg (283 lb 0.1 oz)  08/30/17 130.2 kg (287 lb)  08/17/17 130.2 kg (287 lb)    Physical Exam:  Constitutional: No distress . Vital signs reviewed. HEENT: EOMI, oral membranes moist Neck: supple Cardiovascular: RRR without murmur. No JVD    Respiratory: CTA Bilaterally without wheezes or rales. Normal effort    GI: BS +, non-tender, non-distended with decreased pain with range of motion and weightbearing Musculoskeletal: He exhibits edema.    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 BLE with min edema   Left knee with crepitus  Neurological: He is alert. A cranial nerve deficit is present.  Left central 7.  Speech clear. Oriented to self and place. Improving awareness. asked when therapy starts this morning.  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 4/5 LUE , LLE 4- approximately two 4/5 distally, somewhat limited to knee pain. RUE and RLE 4+/5. Distal sensory loss in both feet present Skin: Skin is warm and dry. He is not diaphoretic.  Psychiatric:pleasant, some  delays in processing.      Assessment/Plan: 1. 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 Physiatrist is providing close team supervision and 24 hour management of active medical problems listed below. Physiatrist and rehab team continue to assess barriers to discharge/monitor patient progress toward functional and medical goals.  Function:  Bathing Bathing position Bathing activity did not occur: Refused    Bathing parts      Bathing assist        Upper Body Dressing/Undressing Upper body dressing Upper body dressing/undressing activity did not occur: Refused                  Upper body assist        Lower Body Dressing/Undressing Lower body dressing Lower body dressing/undressing activity did not occur: Refused                                Lower body assist        Toileting Toileting Toileting activity did not occur: Refused Toileting steps completed by patient: Adjust clothing prior to toileting, Performs perineal hygiene, Adjust clothing after toileting   Toileting Assistive Devices: Grab bar or rail  Toileting assist Assist level: Supervision or verbal cues   Transfers Chair/bed  transfer   Chair/bed transfer method: Ambulatory Chair/bed transfer assist level: Supervision or verbal cues Chair/bed transfer assistive device: Armrests     Locomotion Ambulation     Max distance: 100 ft Assist level: Supervision or verbal cues   Wheelchair   Type: Manual Max wheelchair distance: 150 ft w/BLE Assist Level: Supervision or verbal cues  Cognition Comprehension Comprehension assist level: Understands complex 90% of the time/cues 10% of the time  Expression Expression assist level: Expresses complex ideas: With extra time/assistive device, Expresses complex 90% of the time/cues < 10% of the time  Social Interaction Social Interaction  assist level: Interacts appropriately with others - No medications needed.  Problem Solving Problem solving assist level: Solves complex 90% of the time/cues < 10% of the time, Solves basic problems with no assist  Memory Memory assist level: Recognizes or recalls 75 - 89% of the time/requires cueing 10 - 24% of the time   Medical Problem List and Plan:  1. 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 DVT Prophylaxis/Anticoagulation: Pharmaceutical: Lovenox  3. Pain Management/OA left knee ---improved -tylenol or tramadol  - prn. Ice  -voltaren gel  --Ongoing strengthening with therapy 4. Mood: LCSW to follow for evaluation and support. Mood stable on home dose Zoloft.  5. Neuropsych: This patient is not capable of making decisions on his own behalf.  6. Skin/Wound Care: routine pressure relief measures.  7. Fluids/Electrolytes/Nutrition:   -encourage PO   8. HCM/PPM: Recent syncope with lack of pulse---monitor orthostatic vitals. Continue low dose coreg.  9. Chronic diastolic CHF: Heart healthy diet with daily weights. Lisinopril and Cardura on hold to prevent recurrent syncope/hypotension. Back on torsemide. Continue ASA, Lipitor  and coreg. Monitor for signs of fluid overload.    - TEDs to help manage chronic peripheral edema.    -weights remain stable Filed Weights   09/05/17 0545 09/06/17 0417 09/07/17 0552  Weight: 128.8 kg (284 lb) 128.4 kg (283 lb) 128.4 kg (283 lb 0.1 oz)    10 T2DM with neuropathy and nephropathy: Was on insulin pump at home but currently not competent to use it. Monitor BS ac/hs and follow for pattern .   -Continue lantus insulin with meal coverage--increased lantus to 18u bid   - observe for response   -continue 4u novolog tid   -follow up with Dr. Dwyane Dee after discharge for instructions on resumption of pump  11. OSA: Continue CPAP at nights/when asleep.  12. Asymptomatic tachycardia/NSVT: Monitor for symptoms. Coreg  titrated up to home dose today.   -hgb stable at 10.4     LOS (Days) 3 A FACE TO FACE EVALUATION WAS PERFORMED  Meredith Staggers, MD 09/07/2017 9:59 AM

## 2017-09-07 NOTE — Evaluation (Signed)
Recreational Therapy Assessment and Plan  Patient Details  Name: David [name] MRN: [id number] Date of Birth: December 09, 1940 Today's Date: 09/07/2017  Rehab Potential: Good ELOS: discharge 4/30  Assessment Problem List:      Patient Active Problem List   Diagnosis Date Noted  . Ischemic stroke of frontal lobe ([place]) 09/04/2017  . Agitation   . Restless   . Diastolic dysfunction   . Coronary artery disease involving native coronary artery of native heart without angina pectoris   . Cardiac pacemaker in situ   . Stage 3 chronic kidney disease ([place])   . Atrial fibrillation with rapid ventricular response ([place])   . Acute blood loss anemia   . PVD (peripheral vascular disease) ([place])   . Tachypnea   . Acute ischemic stroke ([place]) 08/31/2017  . Lower extremity edema 04/21/2017  . Anxiety 03/22/2017  . Acute cholecystitis s/p lap cholecystectomy 03/05/2017 03/04/2017  . HOCM (hypertrophic obstructive cardiomyopathy) ([place]) 03/04/2017  . 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 Fatigue 01/27/2017  . Low vitamin B12 level 01/27/2017  . Chronic ulcer of right foot ([place]) 08/09/2016  . Syncope 07/24/2015  . 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 OSA on CPAP 10/26/2014  . Back pain 10/16/2014  . Left leg numbness 10/16/2014  . Episodic lightheadedness 08/04/2014  . Open toe wound 07/18/2014  . Diabetes mellitus with neurological manifestations, uncontrolled ([place]) 02/27/2014  . Seasonal and perennial allergic rhinitis 10/25/2013  . Hypertension associated with diabetes ([place]) 10/25/2013  . Hyperlipidemia associated with type 2 diabetes mellitus ([place]) 10/25/2013  . Morbid obesity ([place]) 10/25/2013  . CKD stage 3 due to type 2 diabetes mellitus ([place]) 10/25/2013    Past Medical History:      Past Medical History:  Diagnosis Date  . Anemia, iron deficiency   . Anxiety   . Arthritis   . BPH (benign prostatic  hypertrophy)   . CAD (coronary artery disease)    Nonobstructive CAD per cath  . Cardiac pacemaker in situ   . CHF (congestive heart failure) ([place])   . Chronic ulcer of right foot ([place])   . CKD (chronic kidney disease), stage III ([place]) secondary to DM and HTN   nephrologist-  [name]  . Dyspnea   . History of cellulitis    right great toe 10-25-2014  . History of skin cancer   . HOCM (hypertrophic obstructive cardiomyopathy) ([place])   . Hypertension   . Insulin dependent type 2 diabetes mellitus ([place]) 1991   followd by [name]--  has insulin pump  . Insulin pump in place   . OSA on CPAP   . Peripheral neuropathy    severe  . Peripheral vascular disease ([place])    bilateral lower extremities  . Rib fracture 07/24/2015  . Secondary hyperparathyroidism of renal origin ([place])   . Sinus node dysfunction ([facility])   . Sleep apnea    Past Surgical History:       Past Surgical History:  Procedure Laterality Date  . 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 AMPUTATION TOE Right 03/12/2015   Procedure: RIGHT HALLUS AMPUTATION ;  Surgeon: [name], MD;  Location: [facility];  Service: Podiatry;  Laterality: Right;  . CARDIAC CATHETERIZATION  11-25-2010   [place], [place]   Nonobstructive CAD  . CARDIAC PACEMAKER PLACEMENT  Nov 2009   [employer]  . CHOLECYSTECTOMY N/A 03/05/2017   Procedure: LAPAROSCOPIC  CHOLECYSTECTOMY WITH INTRAOPERATIVE CHOLANGIOGRAM;  Surgeon: [name], MD;  Location: [facility];  Service: General;  Laterality: N/A;  . EP IMPLANTABLE DEVICE N/A 06/03/2015   Procedure: PPM Generator Changeout;  Surgeon: [name], MD;  Location: [facility];  Service: Cardiovascular;  Laterality: N/A;  . EXCISION BONE CYST Right 03/06/2015   Procedure: BONE BIOPSIES OF RIGHT FOOT;  Surgeon: [name], MD;  Location: [facility];  Service: Podiatry;  Laterality: Right;  .  ORIF ANKLE FRACTURE Left 11/06/2014   Procedure: OPEN REDUCTION INTERNAL FIXATION (ORIF) LEFT  ANKLE FRACTURE;  Surgeon: [name], MD;  Location: [facility];  Service: Orthopedics;  Laterality: Left;  . TEE WITHOUT CARDIOVERSION N/A 09/01/2017   Procedure: TRANSESOPHAGEAL ECHOCARDIOGRAM (TEE);  Surgeon: [name], MD;  Location: [facility];  Service: Cardiovascular;  Laterality: N/A;  . TOTAL KNEE ARTHROPLASTY    . VEIN LIGATION AND STRIPPING      Assessment & Plan Clinical Impression: David [name] is a 77 year old right handed male with history of type 2 diabetes mellitus with neuropathy, CAD, HCM s/p PPM, CKD stage III, age-related macular degeneration who was admitted on 08/31/2016 with confusion, left sided numbness and weakness as well as speech difficulty. CT of head was negative for acute changes. MRI of brain done revealing 2 small areas of restricted diffusion in the right frontal cortex compatible with acute infarct. 2D echo done showing EF of 60-65% with trivial pericardial effusion. Cardiology was consulted for input and he underwent TEE revealing normal LVEF, no PFO, no thrombus and fixed plaque in the thoracic aorta. Stroke felt to be embolic due to unknonw source and ASA and Plavix recommended X 3 weeks followed by Plavix alone. Patient o follow up with Dr. Leonie Man and Dr. Caryl Comes after discharge. He had episode of syncope with fall on 4/20 and lack of pulse. Code initiated with CPR X 1 minute and dose of atropine with return of pulse and back to baseline. PPM interrogated and no A fib noted--syncope fell to be due to hypotension and medications held. He has had few beats of asymptomatic NSVT 4/21 and coreg resumed at lower dose. Low dose torsemide resumed today with recommendations to Monitor BP. Bouts of agitation resolving and family providing supervision for safety? Therapy ongoing with cognitive deficits and weakness affecting ADLs as well as mobility. CIR  recommended due to functional deficits. Patient transferred to CIR on 09/04/2017.   Patient currently requires min guard  with mobility secondary to muscle weakness, decreased cardiorespiratoy endurance, decreased coordination, decreased visual acuity, decreased safety awareness and decreased memory and decreased postural control and hemiplegia.  Met with pt to discuss TR services.  Education provided on energy conservation, community reintegration, community resources as well on emphasis on importance of staying active.  Assisted pt in identifying local agencies for leisure opportunities and exercise.  Pt appreciate of information.        Plan No further TR as pt with short LOS  Recommendations for other services: None   Discharge Criteria: Patient will be discharged from TR if patient refuses treatment 3 consecutive times without medical reason.  If treatment goals not met, if there is a change in medical status, if patient makes no progress towards goals or if patient is discharged from hospital.  The above assessment, treatment plan, treatment alternatives and goals were discussed and mutually agreed upon: by patient  [place] 09/07/2017, 8:15 AM

## 2017-09-07 NOTE — Progress Notes (Signed)
Physical Therapy Note  Patient Details  Name: David [name] MRN: [id number] Date of Birth: [birth year] Today's Date: 09/07/2017    Time: 1020-1105 45 minutes  1:1 No c/o pain.  Pt performs gait throughout unit with SPC with supervision, no LOB in controlled environments.  Standing balance on wedge with horseshoe toss game with pt able to perform reaching and tossing with supervision.  Sit to stand repetitions without UE support for strength and mm endurance.  Seated and standing therex for LE strengthening with 3# wts bilat LEs. Bridging x 12 for glute strengthening with 3 second holds with pt requiring tactile cues for glute activation.  Pt left in room with quick release belt donned,needs at hand, wife present.   [name] 09/07/2017, 11:06 AM

## 2017-09-07 NOTE — Progress Notes (Signed)
Speech Language Pathology Daily Session Note  Patient Details  Name: David [name] MRN: [id number] Date of Birth: December 12, 1940  Today's Date: 09/07/2017 SLP Individual Time: 0830-0930 SLP Individual Time Calculation (min): 60 min  Short Term Goals: Week 1: SLP Short Term Goal 1 (Week 1): Pt will utilize external memory aids to recall new, daily information with supervision A verbal and question cues. SLP Short Term Goal 2 (Week 1): Pt will demonstrate selective attention in moderately distracting environments for 30 minutes with supervision A verbal and question cues. SLP Short Term Goal 3 (Week 1): Pt will complete semi-complex tasks with supervision A verbal cues for functional problem solving.  SLP Short Term Goal 4 (Week 1): Pt will self-monitor and correct functional errors in problem solving tasks with supervision A verbal cues.  Skilled Therapeutic Interventions: Skilled treatment session focused on cognition goals. SLP facilitated session by providing Min A cues to complete semi-complex medication management task. Pt required Mod A cues to self-correct mis-administered pills. Pt required Mod A cues ot recall times to alternate activities and Mod A to complete simple deductive reasoning puzzle. Pt was returned to room, left upright in wheelchair with safety belt in place. Continue per current plan of care.      Function:  Eating Eating                 Cognition Comprehension Comprehension assist level: Understands complex 90% of the time/cues 10% of the time  Expression   Expression assist level: Expresses complex ideas: With extra time/assistive device;Expresses complex 90% of the time/cues < 10% of the time  Social Interaction Social Interaction assist level: Interacts appropriately with others - No medications needed.  Problem Solving Problem solving assist level: Solves complex 90% of the time/cues < 10% of the time;Solves basic problems with no assist  Memory Memory  assist level: Recognizes or recalls 75 - 89% of the time/requires cueing 10 - 24% of the time    Pain Pain Assessment Pain Scale: 0-10 Pain Score: 0-No pain  Therapy/Group: Individual Therapy  Celise Bazar 09/07/2017, 9:21 AM

## 2017-09-08 ENCOUNTER — Inpatient Hospital Stay (HOSPITAL_COMMUNITY): Payer: Medicare Other | Admitting: Physical Therapy

## 2017-09-08 ENCOUNTER — Inpatient Hospital Stay (HOSPITAL_COMMUNITY): Payer: Medicare Other | Admitting: Occupational Therapy

## 2017-09-08 ENCOUNTER — Inpatient Hospital Stay (HOSPITAL_COMMUNITY): Payer: Medicare Other | Admitting: Speech Pathology

## 2017-09-08 LAB — GLUCOSE, CAPILLARY
Glucose-Capillary: 144 mg/dL — ABNORMAL HIGH (ref 65–99)
Glucose-Capillary: 126 mg/dL — ABNORMAL HIGH (ref 65–99)
Glucose-Capillary: 131 mg/dL — ABNORMAL HIGH (ref 65–99)
Glucose-Capillary: 168 mg/dL — ABNORMAL HIGH (ref 65–99)

## 2017-09-08 NOTE — Plan of Care (Signed)
Goals downgraded to supervision overall due to cognitive deficits. See POC for goal details. Dalicia Kisner, OTR/L

## 2017-09-08 NOTE — Patient Care Conference (Signed)
Inpatient RehabilitationTeam Conference and Plan of Care Update Date: 09/06/2017   Time: 10:20 AM    Patient Name: David [name]      Medical Record Number: [id number]  Date of Birth: [birth year] Sex: Male         Room/Bed: 4W08C/4W08C-01 Payor Info: Payor: MEDICARE / Plan: MEDICARE PART A AND B / Product Type: *No Product type* /    Admitting Diagnosis: R CVA  Admit Date/Time:  09/04/2017  3:10 PM Admission Comments: No comment available   Primary Diagnosis:  <principal problem not specified> Principal Problem: <principal problem not specified>  Patient Active Problem List   Diagnosis Date Noted  . Ischemic stroke of frontal lobe ([place]) 09/04/2017  . Agitation   . Restless   . Diastolic dysfunction   . Coronary artery disease involving native coronary artery of native heart without angina pectoris   . Cardiac pacemaker in situ   . Stage 3 chronic kidney disease ([place])   . Atrial fibrillation with rapid ventricular response ([place])   . Acute blood loss anemia   . PVD (peripheral vascular disease) ([place])   . Tachypnea   . Acute ischemic stroke ([place]) 08/31/2017  . Lower extremity edema 04/21/2017  . Anxiety 03/22/2017  . Acute cholecystitis s/p lap cholecystectomy 03/05/2017 03/04/2017  . HOCM (hypertrophic obstructive cardiomyopathy) ([place]) 03/04/2017  . 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 Fatigue 01/27/2017  . Low vitamin B12 level 01/27/2017  . Chronic ulcer of right foot ([place]) 08/09/2016  . Syncope 07/24/2015  . 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 OSA on CPAP 10/26/2014  . Back pain 10/16/2014  . Left leg numbness 10/16/2014  . Episodic lightheadedness 08/04/2014  . Open toe wound 07/18/2014  . Diabetes mellitus with neurological manifestations, uncontrolled ([place]) 02/27/2014  . Seasonal and perennial allergic rhinitis 10/25/2013  . Hypertension associated with diabetes ([place]) 10/25/2013  . Hyperlipidemia  associated with type 2 diabetes mellitus ([place]) 10/25/2013  . Morbid obesity ([place]) 10/25/2013  . CKD stage 3 due to type 2 diabetes mellitus ([place]) 10/25/2013    Expected Discharge Date: Expected Discharge Date: 09/12/17  Team Members Present: Physician leading conference: Dr. Delice Lesch Social Worker Present: Alfonse Alpers, LCSW Nurse Present: Isla Pence, RN PT Present: Lavone Nian, PT OT Present: Napoleon Form, OT SLP Present: Charolett Bumpers, SLP PPS Coordinator present : Daiva Nakayama, RN, CRRN     Current Status/Progress Goal Weekly Team Focus  Medical   right frontal cva, chf, poorly controlled DM, OA left knee  improve balance  pain control, bp/volume mgt, sroke education   Bowel/Bladder   continent of b/b, LBM 4/22, uses urinal or up to BR w/walker, one assist  min assist  monitor b/b q shift and prn, toilet q 3 hours   Swallow/Nutrition/ Hydration             ADL's   CGA- close supervision overall  Mod I overall  ADL/IADL re-training, functional ambulation and transfers, cognitive remediation, d/c planning, family education   Mobility   min assist ambulation without AD  supervision<>mod I overall with LRAD  NMR, balance, transfers, gait, pt/family education, stair negotiation   Communication             Safety/Cognition/ Behavioral Observations  Min-Supervision A  Mod I  mildly complex problem solving, error correction, recall novel info and selective attention   Pain   left knee pain, ice or voltaren gel  pain <2  monitor  pain q shift and prn   Skin   CDI, some ecchymosis to abd  maintain skin integrity, no infection while on IPR  monitor skin q shift and prn    Rehab Goals Patient on target to meet rehab goals: Yes Rehab Goals Revised: none *See Care Plan and progress notes for long and short-term goals.     Barriers to Discharge  Current Status/Progress Possible Resolutions Date Resolved   Physician    Medical stability               Nursing                   PT                    OT                  SLP                SW                Discharge Planning/Teaching Needs:  Pt to return to his home where is wife and 2 dtrs will provide 24/7 supervision.  Family is present often, but will be offered formal family education.   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  MD continues to monitor orthostatics and chronic CHF (weight is stable).  Pt also has DM and sees an endocrinologist.  Pt is alert and oriented and needs PRN bowel medications, but is continent and has not skin issues.  Pt is contact guard to close S with therapists.  Pt is min to S with SLP.  Revisions to Treatment Plan:  none    Continued Need for Acute Rehabilitation Level of Care: The patient requires daily medical management by a physician with specialized training in physical medicine and rehabilitation for the following conditions: Daily direction of a multidisciplinary physical rehabilitation program to ensure safe treatment while eliciting the highest outcome that is of practical value to the patient.: Yes Daily medical management of patient stability for increased activity during participation in an intensive rehabilitation regime.: Yes Daily analysis of laboratory values and/or radiology reports with any subsequent need for medication adjustment of medical intervention for : Cardiac problems;Neurological problems  Joeanthony Seeling, Silvestre Mesi 09/08/2017, 9:33 PM

## 2017-09-08 NOTE — Progress Notes (Signed)
Social Work Patient ID: David [name], male   DOB: [birth year], 77 y.o.   MRN: [id number]     [place] Doig, Levin Erp  Social Worker    Patient Care Conference  Signed  Date of Service:  09/08/2017  9:33 PM          Signed          Show:Clear all [x] Manual[x] Template[] Copied  Added by: [x] Jaysun Wessels, Gerline Legacy, LCSW   [] Hover for details   Inpatient RehabilitationTeam Conference and Plan of Care Update Date: 09/06/2017   Time: 10:20 AM      Patient Name: David [name]      Medical Record Number: [id number]  Date of Birth: 03-05-1941 Sex: Male         Room/Bed: 4W08C/4W08C-01 Payor Info: Payor: MEDICARE / Plan: MEDICARE PART A AND B / Product Type: *No Product type* /     Admitting Diagnosis: R CVA  Admit Date/Time:  09/04/2017  3:10 PM Admission Comments: No comment available    Primary Diagnosis:  <principal problem not specified> Principal Problem: <principal problem not specified>       Patient Active Problem List    Diagnosis Date Noted  . Ischemic stroke of frontal lobe ([place]) 09/04/2017  . Agitation    . Restless    . Diastolic dysfunction    . Coronary artery disease involving native coronary artery of native heart without angina pectoris    . Cardiac pacemaker in situ    . Stage 3 chronic kidney disease ([place])    . Atrial fibrillation with rapid ventricular response ([place])    . Acute blood loss anemia    . PVD (peripheral vascular disease) ([place])    . Tachypnea    . Acute ischemic stroke ([place]) 08/31/2017  . Lower extremity edema 04/21/2017  . Anxiety 03/22/2017  . Acute cholecystitis s/p lap cholecystectomy 03/05/2017 03/04/2017  . HOCM (hypertrophic obstructive cardiomyopathy) ([place]) 03/04/2017  . 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 Fatigue 01/27/2017  . Low vitamin B12 level 01/27/2017  . Chronic ulcer of right foot ([place]) 08/09/2016  . Syncope 07/24/2015  . 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 Pacemaker  06/03/2015  . OSA on CPAP 10/26/2014  . Back pain 10/16/2014  . Left leg numbness 10/16/2014  . Episodic lightheadedness 08/04/2014  . Open toe wound 07/18/2014  . Diabetes mellitus with neurological manifestations, uncontrolled ([place]) 02/27/2014  . Seasonal and perennial allergic rhinitis 10/25/2013  . Hypertension associated with diabetes ([place]) 10/25/2013  . Hyperlipidemia associated with type 2 diabetes mellitus ([place]) 10/25/2013  . Morbid obesity ([place]) 10/25/2013  . CKD stage 3 due to type 2 diabetes mellitus ([place]) 10/25/2013      Expected Discharge Date: Expected Discharge Date: 09/12/17   Team Members Present: Physician leading conference: Dr. Delice Lesch Social Worker Present: Alfonse Alpers, LCSW Nurse Present: Isla Pence, RN PT Present: Lavone Nian, PT OT Present: Napoleon Form, OT SLP Present: Charolett Bumpers, SLP PPS Coordinator present : Daiva Nakayama, RN, CRRN       Current Status/Progress Goal Weekly Team Focus  Medical     right frontal cva, chf, poorly controlled DM, OA left knee  improve balance  pain control, bp/volume mgt, sroke education   Bowel/Bladder     continent of b/b, LBM 4/22, uses urinal or up to BR w/walker, one assist  min assist  monitor b/b q shift and prn, toilet q 3 hours   Swallow/Nutrition/ Hydration  ADL's     CGA- close supervision overall  Mod I overall  ADL/IADL re-training, functional ambulation and transfers, cognitive remediation, d/c planning, family education   Mobility     min assist ambulation without AD  supervision<>mod I overall with LRAD  NMR, balance, transfers, gait, pt/family education, stair negotiation   Communication               Safety/Cognition/ Behavioral Observations   Min-Supervision A  Mod I  mildly complex problem solving, error correction, recall novel info and selective attention   Pain     left knee pain, ice or voltaren gel  pain <2  monitor pain q shift and prn   Skin     CDI, some  ecchymosis to abd  maintain skin integrity, no infection while on IPR  monitor skin q shift and prn     Rehab Goals Patient on target to meet rehab goals: Yes Rehab Goals Revised: none *See Care Plan and progress notes for long and short-term goals.      Barriers to Discharge   Current Status/Progress Possible Resolutions Date Resolved   Physician     Medical stability              Nursing                 PT                    OT                 SLP            SW              Discharge Planning/Teaching Needs:  Pt to return to his home where is wife and 2 dtrs will provide 24/7 supervision.  Family is present often, but will be offered formal family education.   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  MD continues to monitor orthostatics and chronic CHF (weight is stable).  Pt also has DM and sees an endocrinologist.  Pt is alert and oriented and needs PRN bowel medications, but is continent and has not skin issues.  Pt is contact guard to close S with therapists.  Pt is min to S with SLP.  Revisions to Treatment Plan:  none    Continued Need for Acute Rehabilitation Level of Care: The patient requires daily medical management by a physician with specialized training in physical medicine and rehabilitation for the following conditions: Daily direction of a multidisciplinary physical rehabilitation program to ensure safe treatment while eliciting the highest outcome that is of practical value to the patient.: Yes Daily medical management of patient stability for increased activity during participation in an intensive rehabilitation regime.: Yes Daily analysis of laboratory values and/or radiology reports with any subsequent need for medication adjustment of medical intervention for : Cardiac problems;Neurological problems   Areeba Sulser, Silvestre Mesi 09/08/2017, 9:33 PM

## 2017-09-08 NOTE — Progress Notes (Signed)
[place] PHYSICAL MEDICINE & REHABILITATION     PROGRESS NOTE    Subjective/Complaints: Up in chair. No new problems. Waiting for breakfast  ROS: Patient denies fever, rash, sore throat, blurred vision, nausea, vomiting, diarrhea, cough, shortness of breath or chest pain, joint or back pain, headache, or mood change.   Objective: Vital Signs: Blood pressure (!) 152/65, pulse 66, temperature 98.7 F (37.1 C), temperature source Oral, resp. rate 19, weight 125.7 kg (277 lb 1.9 oz), SpO2 96 %. No results found. No results for input(s): WBC, HGB, HCT, PLT in the last 72 hours. No results for input(s): NA, K, CL, GLUCOSE, BUN, CREATININE, CALCIUM in the last 72 hours.  Invalid input(s): CO CBG (last 3)  Recent Labs    09/07/17 1647 09/07/17 2055 09/08/17 0657  GLUCAP 129* 171* 144*    Wt Readings from Last 3 Encounters:  09/08/17 125.7 kg (277 lb 1.9 oz)  08/30/17 130.2 kg (287 lb)  08/17/17 130.2 kg (287 lb)    Physical Exam:  Constitutional: No distress . Vital signs reviewed. HEENT: EOMI, oral membranes moist Neck: supple Cardiovascular: RRR without murmur. No JVD    Respiratory: CTA Bilaterally without wheezes or rales. Normal effort    GI: BS +, non-tender, non-distended  Musculoskeletal: He exhibits edema.    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 BLE with min edema   Left knee with crepitus  Neurological: He is alert. A cranial nerve deficit is present.  Left central 7.  Speech clear. Oriented to self and place. asked when therapy starts this morning.  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 4/5 LUE , LLE 4- approximately two 4/5 distally, somewhat limited to knee pain. RUE and RLE 4+/5. Distal sensory loss in both feet present Skin: Skin is warm and dry. He is not diaphoretic.  Psychiatric:pleasant, reasonable insight and awareness.      Assessment/Plan: 1. Functional deficits secondary to right frontal CVA which require 3+ hours per day of interdisciplinary therapy  in a comprehensive inpatient rehab setting. Physiatrist is providing close team supervision and 24 hour management of active medical problems listed below. Physiatrist and rehab team continue to assess barriers to discharge/monitor patient progress toward functional and medical goals.  Function:  Bathing Bathing position Bathing activity did not occur: Refused Position: Production manager parts bathed by patient: Right arm, Right lower leg, Left arm, Left lower leg, Chest, Abdomen, Back, Front perineal area, Buttocks, Right upper leg    Bathing assist Assist Level: Supervision or verbal cues      Upper Body Dressing/Undressing Upper body dressing Upper body dressing/undressing activity did not occur: Refused What is the patient wearing?: Pull over shirt/dress     Pull over shirt/dress - Perfomed by patient: Thread/unthread right sleeve, Thread/unthread left sleeve, Put head through opening, Pull shirt over trunk          Upper body assist Assist Level: Supervision or verbal cues      Lower Body Dressing/Undressing Lower body dressing Lower body dressing/undressing activity did not occur: Refused What is the patient wearing?: Underwear, Pants, Socks, Shoes Underwear - Performed by patient: Thread/unthread right underwear leg, Thread/unthread left underwear leg, Pull underwear up/down   Pants- Performed by patient: Thread/unthread right pants leg, Thread/unthread left pants leg, Pull pants up/down       Socks - Performed by patient: Don/doff right sock, Don/doff left sock   Shoes - Performed by patient: Don/doff right shoe, Don/doff left shoe  Lower body assist Assist for lower body dressing: Supervision or verbal cues      Toileting Toileting Toileting activity did not occur: Refused Toileting steps completed by patient: Adjust clothing prior to toileting, Performs perineal hygiene, Adjust clothing after toileting   Toileting Assistive Devices: Grab  bar or rail  Toileting assist Assist level: Supervision or verbal cues   Transfers Chair/bed transfer   Chair/bed transfer method: Ambulatory Chair/bed transfer assist level: Supervision or verbal cues Chair/bed transfer assistive device: Armrests     Locomotion Ambulation     Max distance: 150 ft Assist level: Supervision or verbal cues   Wheelchair   Type: Manual Max wheelchair distance: 150 ft w/BLE Assist Level: Supervision or verbal cues  Cognition Comprehension Comprehension assist level: Understands complex 90% of the time/cues 10% of the time  Expression Expression assist level: Expresses complex ideas: With extra time/assistive device, Expresses complex 90% of the time/cues < 10% of the time  Social Interaction Social Interaction assist level: Interacts appropriately with others - No medications needed.  Problem Solving Problem solving assist level: Solves complex 90% of the time/cues < 10% of the time, Solves basic problems with no assist  Memory Memory assist level: Recognizes or recalls 75 - 89% of the time/requires cueing 10 - 24% of the time   Medical Problem List and Plan:  1. 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 DVT Prophylaxis/Anticoagulation: Pharmaceutical: Lovenox  3. Pain Management/OA left knee ---improved -tylenol or tramadol  - prn. Ice  -voltaren gel  --Ongoing strengthening with therapy 4. Mood: LCSW to follow for evaluation and support. Mood stable on home dose Zoloft.  5. Neuropsych: This patient is not capable of making decisions on his own behalf.  6. Skin/Wound Care: routine pressure relief measures.  7. Fluids/Electrolytes/Nutrition:   -encourage PO   8. HCM/PPM: Recent syncope with lack of pulse---monitor orthostatic vitals. Continue low dose coreg.  9. Chronic diastolic CHF: Heart healthy diet with daily weights. Lisinopril and Cardura on hold to prevent recurrent syncope/hypotension. Back on torsemide.  Continue ASA, Lipitor  and coreg. Monitor for signs of fluid overload.    - TEDs for edema control.    -weights remain stable to decreased Filed Weights   09/06/17 0417 09/07/17 0552 09/08/17 0500  Weight: 128.4 kg (283 lb) 128.4 kg (283 lb 0.1 oz) 125.7 kg (277 lb 1.9 oz)    10 T2DM with neuropathy and nephropathy: Was on insulin pump at home but currently not competent to use it. Monitor BS ac/hs and follow for pattern .   -Continue lantus insulin with meal coverage--increased lantus to 18u bid   - improving control   -continue 4u novolog tid   -follow up with Dr. Dwyane Dee after discharge for instructions on resumption of pump  11. OSA: Continue CPAP at nights/when asleep.  12. Asymptomatic tachycardia/NSVT: Monitor for symptoms. Coreg titrated up to home dose today.   -hgb stable at 10.4     LOS (Days) 4 A FACE TO FACE EVALUATION WAS PERFORMED  Meredith Staggers, MD 09/08/2017 10:46 AM

## 2017-09-08 NOTE — Progress Notes (Cosign Needed)
Social Work Patient ID: David [name], male   DOB: 1940-06-16, 77 y.o.   MRN: [id number]   CSW met with pt and then later talked to his wife on 09-06-17 to update them on team conference discussion and targeted d/c date of 09-12-17.  Pt and wife were both pleased with this date and feel pt will be ready.  Wife and her dtrs will be with pt 24/7.  Pt has all needed DME at home, but will need outpt therapies.  CSW has arranged this with [facility].  CSW will continue to follow and assist as needed.

## 2017-09-08 NOTE — Progress Notes (Signed)
Speech Language Pathology Daily Session Note  Patient Details  Name: David [name] MRN: [id number] Date of Birth: Jun 15, 1940  Today's Date: 09/08/2017 SLP Individual Time: 2725-3664 SLP Individual Time Calculation (min): 45 min  Short Term Goals: Week 1: SLP Short Term Goal 1 (Week 1): Pt will utilize external memory aids to recall new, daily information with supervision A verbal and question cues. SLP Short Term Goal 2 (Week 1): Pt will demonstrate selective attention in moderately distracting environments for 30 minutes with supervision A verbal and question cues. SLP Short Term Goal 3 (Week 1): Pt will complete semi-complex tasks with supervision A verbal cues for functional problem solving.  SLP Short Term Goal 4 (Week 1): Pt will self-monitor and correct functional errors in problem solving tasks with supervision A verbal cues.  Skilled Therapeutic Interventions: Skilled treatment session focused on cognition goals. SLP facilitated session by providing Mod I to complete calendar task. Pt with decreased ability ~ 25% accurate when SLP reviewed with pt. SLP left another clean copy for pt to complete over weekend. Pt with no awareness of deficits until seeing them in black and white print. Education provided on stroke and deficits. Are recommending 24 hour supervision d/t emerging cognitive deficits.      Function:  Eating Eating                 Cognition Comprehension Comprehension assist level: Follows basic conversation/direction with no assist  Expression   Expression assist level: Expresses complex 90% of the time/cues < 10% of the time  Social Interaction Social Interaction assist level: Interacts appropriately with others - No medications needed.  Problem Solving Problem solving assist level: Solves basic problems with no assist  Memory Memory assist level: Recognizes or recalls 75 - 89% of the time/requires cueing 10 - 24% of the time    Pain    Therapy/Group:  Individual Therapy  Earlean Fidalgo 09/08/2017, 4:20 PM

## 2017-09-08 NOTE — Progress Notes (Signed)
Occupational Therapy Session Note  Patient Details  Name: David [name] MRN: [id number] Date of Birth: 12-20-1940  Today's Date: 09/08/2017 OT Individual Time: 6761-9509 OT Individual Time Calculation (min): 60 min    Short Term Goals: Week 1:  OT Short Term Goal 1 (Week 1): STG=LTG d/t ELOS  Skilled Therapeutic Interventions/Progress Updates:    Pt seen for OT session focusing on functional ambulation and transfers. Pt received sitting on toilet with hand off from NT. Pt was ambulating without AD with NT. Discussed use of AD at d/c, pt reports he plans to "just hold onto furniture as he walks at home". Discussed pt's big fall risk including increased risk of fall when holding onto furniture. Discussed with PT and recommending use of cane at d/c. Education provided to pt regarding recommendation and reasoning for it, he voiced understanding though will likely cont to need reinforcement of education. He self propelled w/c to ADL apartment using B LEs for LE strengthening. In apartment, ambulated with can and close supervision. Completed functional transfers of tub/shower combination with use of tub transfer bench- VCs for technique and recommendation for this method at d/c.  He then completed sit <> Stand from low soft recliner with supervision demonstrating improved LE strength for functional transfers. Completed bed mobility on standard bed in simulation of home environment at [place] I level.  Completed kitchen mobility task, pt ambulating with SPC and retrieving and replacing items from overhead cabinet, retrieving item from freezer in simulation of simple meal prep to heat item in microwave, obtain plate and carry to table. Pt with increased difficulty following multistep commands this session compared to previous sessions. Pt ambulated back to room at end of session with [facility] and supervision. Pt left seated in w/c, QRB on and all needs in reach.   Therapy Documentation Precautions:   Precautions Precautions: Fall Restrictions Weight Bearing Restrictions: No Pain:   No/denies pain ADL: ADL ADL Comments: See functional navigator  See Function Navigator for Current Functional Status.   Therapy/Group: Individual Therapy  [name] 09/08/2017, 7:05 AM

## 2017-09-08 NOTE — Progress Notes (Signed)
Physical Therapy Session Note  Patient Details  Name: David [name] MRN: [id number] Date of Birth: 1940-08-27  Today's Date: 09/08/2017 PT Individual Time: 0802-0900 and 1133-1203 PT Individual Time Calculation (min): 58 min and 30 min  Short Term Goals: Week 1:  PT Short Term Goal 1 (Week 1): STG = LTG due to short ELOS.  Skilled Therapeutic Interventions/Progress Updates:   Session 1: Pt received in w/c, agreeable to therapy. Pt c/o L knee chronic pain 2/2 arthritis, rest breaks taken prn. Pt ambulated w/ tripod cane and supervision, demonstrating wide BOS, decreased trunk rotation, and decreased bilateral heel strike during gait. Pt performed obstacle course focusing on high level gait and to simulate community ambulation, including: negotiating cones, stepping over obstacles, and side stepping. Pt had 1 LOB, required min assist to correct. Pt performed sit<>stands, focusing on eccentric control as pt frequently demonstrates poor eccentric control during stand>sit. Pt educated on importance of safety and need for controlled stand>sit. Pt performed sit<>stands from low, compliant surface (sofa), focusing on anterior weight shift, pushing through BLE, and eccentric control. Pt required use of BUE to perform transfer. Pt reports having difficulty getting up from sofa at home. Pt performed high level balance activity in parallel bars w/ min assist: mini squats on compliant surface and rockerboard anterior-posterior to facilitate ankle strategies and improve static standing balance. Pt demonstrated increased posterior lean and decreased ankle strategies to correct balance. Pt performed [employer] limits of stability to improve weight shifting and ankle strategies, requiring min assist and verbal cuing for technique. Pt demonstrated increased trunk rotation during activity and needed cuing and demonstration to generate movements from ankles. Pt reported increased difficulty and frustration with task. At end  of session, pt seated in w/c in dayroom, quick release belt on, awaiting SLP arrival for session.   Session 2: Pt received in w/c, agreeable to therapy, family present. Pt and family educated on therapist discharge recommendations to use cane and have someone with him at all times. Family reports pt has 3 canes dispersed throughout home which he uses in addition to furniture walking. Pt and family educated on recommendations to not furniture walk and not walk without cane. Pt and family verbalize understanding of recommendations. Pt transported to gym via w/c total assist for time management and energy conservation. Pt performed Berg Balance Test and score 28/56, indicating very high fall risk. Pt and family educated on meaning of scores and on therapist recommendations upon discharge for constant supervision and use of cane. At end of session, pt left seated in recliner, quick release belt on, chair alarm on and family members present.   Therapy Documentation Precautions:  Precautions Precautions: Fall Restrictions Weight Bearing Restrictions: No   See Function Navigator for Current Functional Status.   Therapy/Group: Individual Therapy  Caffie Damme 09/08/2017, 9:15 AM

## 2017-09-09 ENCOUNTER — Inpatient Hospital Stay (HOSPITAL_COMMUNITY): Payer: Medicare Other

## 2017-09-09 ENCOUNTER — Inpatient Hospital Stay (HOSPITAL_COMMUNITY): Payer: Medicare Other | Admitting: Occupational Therapy

## 2017-09-09 ENCOUNTER — Inpatient Hospital Stay (HOSPITAL_COMMUNITY): Payer: Medicare Other | Admitting: Physical Therapy

## 2017-09-09 LAB — GLUCOSE, CAPILLARY
Glucose-Capillary: 138 mg/dL — ABNORMAL HIGH (ref 65–99)
Glucose-Capillary: 130 mg/dL — ABNORMAL HIGH (ref 65–99)
Glucose-Capillary: 162 mg/dL — ABNORMAL HIGH (ref 65–99)

## 2017-09-09 NOTE — Progress Notes (Signed)
[place] [facility] PHYSICAL MEDICINE & REHABILITATION     PROGRESS NOTE    Subjective/Complaints: Patient just awoke.  No problems overnight.  Sleeping well.  Denies pain at present  ROS: Patient denies fever, rash, sore throat, blurred vision, nausea, vomiting, diarrhea, cough, shortness of breath or chest pain, joint or back pain, headache, or mood change.   Objective: Vital Signs: Blood pressure (!) 166/53, pulse 60, temperature 98.3 F (36.8 C), temperature source Oral, resp. rate 18, weight 123.9 kg (273 lb 2.4 oz), SpO2 98 %. No results found. No results for input(s): WBC, HGB, HCT, PLT in the last 72 hours. No results for input(s): NA, K, CL, GLUCOSE, BUN, CREATININE, CALCIUM in the last 72 hours.  Invalid input(s): CO CBG (last 3)  Recent Labs    09/08/17 1221 09/08/17 1636 09/08/17 2127  GLUCAP 126* 131* 168*    Wt Readings from Last 3 Encounters:  09/09/17 123.9 kg (273 lb 2.4 oz)  08/30/17 130.2 kg (287 lb)  08/17/17 130.2 kg (287 lb)    Physical Exam:  Constitutional: No distress . Vital signs reviewed. obese HEENT: EOMI, oral membranes moist Neck: supple Cardiovascular: RRR without murmur. No JVD    Respiratory: CTA Bilaterally without wheezes or rales. Normal effort    GI: BS +, non-tender, non-distended   Musculoskeletal: He exhibits edema.    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 BLE with min edema   Left knee with crepitus  Neurological: He is alert. A cranial nerve deficit is present.  Left central 7.  Speech clear. Oriented to self and place. asked when therapy starts this morning.  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 4/5 LUE , LLE 4- approximately two 4/5 distally, somewhat limited to knee pain. RUE and RLE 4+/5. Distal sensory loss in both feet present--stable neuro exam Skin: Skin is warm and dry. He is not diaphoretic.  Psychiatric:pleasant, reasonable insight and awareness.      Assessment/Plan: 1. 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 Physiatrist is providing close team supervision and 24 hour management of active medical problems listed below. Physiatrist and rehab team continue to assess barriers to discharge/monitor patient progress toward functional and medical goals.  Function:  Bathing Bathing position Bathing activity did not occur: Refused Position: Production manager parts bathed by patient: Right arm, Right lower leg, Left arm, Left lower leg, Chest, Abdomen, Back, Front perineal area, Buttocks, Right upper leg    Bathing assist Assist Level: Supervision or verbal cues      Upper Body Dressing/Undressing Upper body dressing Upper body dressing/undressing activity did not occur: Refused What is the patient wearing?: Pull over shirt/dress     Pull over shirt/dress - Perfomed by patient: Thread/unthread right sleeve, Thread/unthread left sleeve, Put head through opening, Pull shirt over trunk          Upper body assist Assist Level: Supervision or verbal cues      Lower Body Dressing/Undressing Lower body dressing Lower body dressing/undressing activity did not occur: Refused What is the patient wearing?: Underwear, Pants, Socks, Shoes Underwear - Performed by patient: Thread/unthread right underwear leg, Thread/unthread left underwear leg, Pull underwear up/down   Pants- Performed by patient: Thread/unthread right pants leg, Thread/unthread left pants leg, Pull pants up/down       Socks - Performed by patient: Don/doff right sock, Don/doff left sock   Shoes - Performed by patient: Don/doff right  shoe, Don/doff left shoe            Lower body assist Assist for lower body dressing: Supervision or verbal cues      Toileting Toileting Toileting activity did not occur: Refused Toileting steps completed by patient: Adjust clothing prior to toileting, Performs perineal hygiene, Adjust  clothing after toileting   Toileting Assistive Devices: Grab bar or rail  Toileting assist Assist level: Supervision or verbal cues   Transfers Chair/bed transfer   Chair/bed transfer method: Ambulatory Chair/bed transfer assist level: Supervision or verbal cues Chair/bed transfer assistive device: Armrests     Locomotion Ambulation     Max distance: 150 ft Assist level: Supervision or verbal cues   Wheelchair   Type: Manual Max wheelchair distance: 150 ft w/BLE Assist Level: Supervision or verbal cues  Cognition Comprehension Comprehension assist level: Follows basic conversation/direction with no assist  Expression Expression assist level: Expresses complex 90% of the time/cues < 10% of the time  Social Interaction Social Interaction assist level: Interacts appropriately with others - No medications needed.  Problem Solving Problem solving assist level: Solves basic problems with no assist  Memory Memory assist level: Recognizes or recalls 75 - 89% of the time/requires cueing 10 - 24% of the time   Medical Problem List and Plan:  1. 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 DVT Prophylaxis/Anticoagulation: Pharmaceutical: Lovenox  3. Pain Management/OA left knee ---improved -tylenol or tramadol  - prn. Ice  -voltaren gel  --Ongoing strengthening with therapy 4. Mood: LCSW to follow for evaluation and support. Mood stable on home dose Zoloft.  5. Neuropsych: This patient is not capable of making decisions on his own behalf.  6. Skin/Wound Care: routine pressure relief measures.  7. Fluids/Electrolytes/Nutrition:   -encourage PO   8. HCM/PPM: Recent syncope with lack of pulse---monitor orthostatic vitals. Continue low dose coreg.  9. Chronic diastolic CHF: Heart healthy diet with daily weights. Lisinopril and Cardura on hold to prevent recurrent syncope/hypotension. Back on torsemide. Continue  ASA, Lipitor  and coreg. Monitor for signs of fluid overload.    - TEDs for edema control.    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 Monitor BS ac/hs and follow for pattern .   -Continue lantus insulin with meal coverage--increased lantus to 18u bid   -Improved and stable control   -continue 4u novolog tid   -follow up with Dr. Dwyane Dee after discharge for instructions on resumption of pump  11. OSA: Continue CPAP at nights/when asleep.  12. Asymptomatic tachycardia/NSVT: Monitor for symptoms. Coreg titrated up to home dose today.   -hgb stable at 10.4     LOS (Days) 5 A FACE TO FACE EVALUATION WAS PERFORMED  Meredith Staggers, MD 09/09/2017 9:00 AM

## 2017-09-09 NOTE — Progress Notes (Signed)
Physical Therapy Session Note  Patient Details  Name: David [name] MRN: [id number] Date of Birth: 1941-01-21  Today's Date: 09/09/2017 PT Individual Time: 2952-8413 PT Individual Time Calculation (min): 60 min   Short Term Goals: Week 1:  PT Short Term Goal 1 (Week 1): STG = LTG due to short ELOS.  Skilled Therapeutic Interventions/Progress Updates:   Pt received in w/c, agreeable to therapy, daughter present. Daughter reports her family would like pt to ambulate w/ rolling walker for more stability and balance support. Pt is agreeable to this decision and reports that he believes he will feel more secure with RW compared to tripod cane. Pt educated on RW management and ambulation w/ device. Pt ambulated >200 feet around unit w/ RW and supervision, requiring verbal cuing for upright posture and forward gaze. Pt reports feeling more stable with RW. Pt and daughter educated on therapist recommendations for pt to avoid walking the dog upon discharge home. Pt performed stretching of BLE x 60 sec each: calf stretch on wedge, supine quad stretch, and hamstring stretch with towel. Pt ambulated w/ RW through obstacle course with cone negotiation, stepping over objects and ambulating over compliant surface. Pt performed activity with supervision and required verbal cuing to maintain RW within BOS, especially during turns. Pt performed NuStep level 4 x10 min with BUE and BLE for endurance and activity tolerance. Pt reported activity at 13 on RPE scale. At end of session, pt left seated in w/c, quick release belt on, chair alarm on, and daughter present.   Therapy Documentation Precautions:  Precautions Precautions: Fall Restrictions Weight Bearing Restrictions: No   See Function Navigator for Current Functional Status.   Therapy/Group: Individual Therapy  Caffie Damme 09/09/2017, 2:50 PM

## 2017-09-09 NOTE — Progress Notes (Signed)
Speech Language Pathology Daily Session Note  Patient Details  Name: David [name] MRN: [id number] Date of Birth: 1941/02/10  Today's Date: 09/09/2017 SLP Individual Time: 9357-0177 SLP Individual Time Calculation (min): 26 min  Short Term Goals: Week 1: SLP Short Term Goal 1 (Week 1): Pt will utilize external memory aids to recall new, daily information with supervision A verbal and question cues. SLP Short Term Goal 2 (Week 1): Pt will demonstrate selective attention in moderately distracting environments for 30 minutes with supervision A verbal and question cues. SLP Short Term Goal 3 (Week 1): Pt will complete semi-complex tasks with supervision A verbal cues for functional problem solving.  SLP Short Term Goal 4 (Week 1): Pt will self-monitor and correct functional errors in problem solving tasks with supervision A verbal cues.  Skilled Therapeutic Interventions: Skilled treatment session focused on cognition goals. SLP facilitated session by providing mod-max verbal + visual cues for organization/planning of tasks that needed to be completed on scheduling task with supervision question prompts for convergent reasoning re: placement of "to-do" items in daily schedule; cues were successfully faded to Mod I as the task progressed. SLP encouraged pt to complete calendar task left by primary therapist on Friday. Continue to recommend 24-hour supervision d/t emerging cognitive deficits. Pt left in room, upright in wc with call bell in reach and quick release belt donned.  Function:  Cognition Comprehension Comprehension assist level: Follows basic conversation/direction with no assist  Expression   Expression assist level: Expresses complex 90% of the time/cues < 10% of the time  Social Interaction Social Interaction assist level: Interacts appropriately with others - No medications needed.  Problem Solving Problem solving assist level: Solves basic problems with no assist  Memory Memory  assist level: Recognizes or recalls 75 - 89% of the time/requires cueing 10 - 24% of the time    Pain Pain Assessment Pain Scale: 0-10 Pain Score: 0-No pain  Therapy/Group: Individual Therapy  Cara Thaxton A Doyne Ellinger 09/09/2017, 11:59 AM

## 2017-09-09 NOTE — Progress Notes (Addendum)
Occupational Therapy Session Note  Patient Details  Name: David [name] MRN: [id number] Date of Birth: 1941-02-11  Today's Date: 09/09/2017 OT Individual Time: 0950-1105 OT Individual Time Calculation (min): 75 min   Skilled Therapeutic Interventions/Progress Updates: Patient participated in dynamic standing balance and functional mobility activities to increase patient safety with self care, etc.   He also worked on standing and seated endurance activities and became short of breath with all activities.      His wife, daughter, and pet came in during session.    Wife was able to return demonstration for tub bench transfer into ADL tub shower (patient required supervision) for patient/family education.    Wife asked if patient could practice walking his dog this session.  Patient seemed SOB and required many rest breaks this session and thereby not safe to walk a dog who might impulsively dart quickly into a direction away from [name].      Maybe he would be safe at a less fatigued time.    At the end of the session, patient was left seated in his w/c with doggie on his lap.   Family stated they would contact nursing to refasten patient's safety belt before they left or if they felt he needed it fastened before then.     Therapy Documentation Precautions:  Precautions Precautions: Fall Restrictions Weight Bearing Restrictions: No  Pain:not voiced or indicated this session  Therapy/Group: Individual Therapy  Alfredia Ferguson [facility] 09/09/2017, 4:14 PM

## 2017-09-10 LAB — GLUCOSE, CAPILLARY
Glucose-Capillary: 217 mg/dL — ABNORMAL HIGH (ref 65–99)
Glucose-Capillary: 134 mg/dL — ABNORMAL HIGH (ref 65–99)
Glucose-Capillary: 206 mg/dL — ABNORMAL HIGH (ref 65–99)

## 2017-09-10 NOTE — Progress Notes (Signed)
[place] PHYSICAL MEDICINE & REHABILITATION     PROGRESS NOTE    Subjective/Complaints: No new complaints. Slept well.  Anxious to spend time with family today.  ROS: Patient denies fever, rash, sore throat, blurred vision, nausea, vomiting, diarrhea, cough, shortness of breath or chest pain, joint or back pain, headache, or mood change.   Objective: Vital Signs: Blood pressure (!) 151/49, pulse 65, temperature 98.5 F (36.9 C), temperature source Oral, resp. rate 17, weight 119.9 kg (264 lb 5.3 oz), SpO2 95 %. No results found. No results for input(s): WBC, HGB, HCT, PLT in the last 72 hours. No results for input(s): NA, K, CL, GLUCOSE, BUN, CREATININE, CALCIUM in the last 72 hours.  Invalid input(s): CO CBG (last 3)  Recent Labs    09/09/17 1144 09/09/17 1723 09/09/17 2203  GLUCAP 138* 130* 162*    Wt Readings from Last 3 Encounters:  09/10/17 119.9 kg (264 lb 5.3 oz)  08/30/17 130.2 kg (287 lb)  08/17/17 130.2 kg (287 lb)    Physical Exam:  Constitutional: No distress . Vital signs reviewed. HEENT: EOMI, oral membranes moist Neck: supple Cardiovascular: RRR without murmur. No JVD    Respiratory: CTA Bilaterally without wheezes or rales. Normal effort    GI: BS +, non-tender, non-distended    Musculoskeletal: He exhibits edema.    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 BLE with min edema     Neurological: He is alert. A cranial nerve deficit is present.  Left central 7 improving.  Speech clear. Oriented to self and place. asked when therapy starts this morning.  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 4/5 LUE , LLE 4- approximately two 4/5 distally, somewhat limited to knee pain. RUE and RLE 4+/5. Distal sensory loss in both feet present--stable neuro exam Skin: Skin is warm and dry. He is not diaphoretic.  Psychiatric:pleasant, reasonable insight and awareness.      Assessment/Plan: 1. 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 Physiatrist is providing close team supervision and 24 hour management of active medical problems listed below. Physiatrist and rehab team continue to assess barriers to discharge/monitor patient progress toward functional and medical goals.  Function:  Bathing Bathing position Bathing activity did not occur: Refused Position: Production manager parts bathed by patient: Right arm, Right lower leg, Left arm, Left lower leg, Chest, Abdomen, Back, Front perineal area, Buttocks, Right upper leg    Bathing assist Assist Level: Supervision or verbal cues      Upper Body Dressing/Undressing Upper body dressing Upper body dressing/undressing activity did not occur: Refused What is the patient wearing?: Pull over shirt/dress     Pull over shirt/dress - Perfomed by patient: Thread/unthread right sleeve, Thread/unthread left sleeve, Put head through opening, Pull shirt over trunk          Upper body assist Assist Level: Supervision or verbal cues      Lower Body Dressing/Undressing Lower body dressing Lower body dressing/undressing activity did not occur: Refused What is the patient wearing?: Underwear, Pants, Socks, Shoes Underwear - Performed by patient: Thread/unthread right underwear leg, Thread/unthread left underwear leg, Pull underwear up/down   Pants- Performed by patient: Thread/unthread right pants leg, Thread/unthread left pants leg, Pull pants up/down       Socks - Performed by patient: Don/doff right sock, Don/doff left sock   Shoes - Performed by patient: Don/doff right shoe, Don/doff left shoe  Lower body assist Assist for lower body dressing: Supervision or verbal cues      Toileting Toileting Toileting activity did not occur: Refused Toileting steps completed by patient: Adjust clothing prior to toileting, Performs perineal hygiene, Adjust clothing after toileting    Toileting Assistive Devices: Grab bar or rail  Toileting assist Assist level: Touching or steadying assistance (Pt.75%)   Transfers Chair/bed transfer   Chair/bed transfer method: Ambulatory Chair/bed transfer assist level: Supervision or verbal cues Chair/bed transfer assistive device: Armrests     Locomotion Ambulation     Max distance: >200 ft Assist level: Supervision or verbal cues   Wheelchair   Type: Manual Max wheelchair distance: 150 ft w/BLE Assist Level: Supervision or verbal cues  Cognition Comprehension Comprehension assist level: Follows basic conversation/direction with no assist  Expression Expression assist level: Expresses complex 90% of the time/cues < 10% of the time  Social Interaction Social Interaction assist level: Interacts appropriately with others - No medications needed.  Problem Solving Problem solving assist level: Solves basic problems with no assist  Memory Memory assist level: Recognizes or recalls 75 - 89% of the time/requires cueing 10 - 24% of the time   Medical Problem List and Plan:  1. 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 DVT Prophylaxis/Anticoagulation: Pharmaceutical: Lovenox  3. Pain Management/OA left knee ---improved -tylenol or tramadol  - prn. Ice  -voltaren gel  --Ongoing strengthening with therapy 4. Mood: LCSW to follow for evaluation and support. Mood stable on home dose Zoloft.  5. Neuropsych: This patient is not capable of making decisions on his own behalf.  6. Skin/Wound Care: routine pressure relief measures.  7. Fluids/Electrolytes/Nutrition:   -encourage PO   8. HCM/PPM: Recent syncope with lack of pulse---monitor orthostatic vitals. Continue low dose coreg.  9. Chronic diastolic CHF: Heart healthy diet with daily weights. Lisinopril and Cardura on hold to prevent recurrent syncope/hypotension. Back on torsemide. Continue ASA,  Lipitor  and coreg. Monitor for signs of fluid overload.    - TEDs for edema control.    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 Monitor BS ac/hs and follow for pattern .   -Continue lantus insulin with meal coverage--increased lantus to 18u bid   -Improved and stable control at present   -continue 4u novolog tid   -follow up with Dr. Dwyane Dee after discharge for instructions on resumption of pump  11. OSA: Continue CPAP at nights/when asleep.  12. Asymptomatic tachycardia/NSVT: Monitor for symptoms. Coreg titrated up to home dose today.   -hgb stable at 10.4     LOS (Days) 6 A FACE TO FACE EVALUATION WAS PERFORMED  Meredith Staggers, MD 09/10/2017 9:45 AM

## 2017-09-10 NOTE — Progress Notes (Signed)
Patient has home CPAP set up at bedside. Filled chamber with distilled H20 per request. No further assistance needed per patient.

## 2017-09-11 ENCOUNTER — Inpatient Hospital Stay (HOSPITAL_COMMUNITY): Payer: Medicare Other

## 2017-09-11 ENCOUNTER — Inpatient Hospital Stay (HOSPITAL_COMMUNITY): Payer: Medicare Other | Admitting: Occupational Therapy

## 2017-09-11 ENCOUNTER — Inpatient Hospital Stay (HOSPITAL_COMMUNITY): Payer: Medicare Other | Admitting: Speech Pathology

## 2017-09-11 DIAGNOSIS — Z9641 Presence of insulin pump (external) (internal): Secondary | ICD-10-CM

## 2017-09-11 DIAGNOSIS — I5022 Chronic systolic (congestive) heart failure: Secondary | ICD-10-CM

## 2017-09-11 DIAGNOSIS — R269 Unspecified abnormalities of gait and mobility: Secondary | ICD-10-CM

## 2017-09-11 DIAGNOSIS — E118 Type 2 diabetes mellitus with unspecified complications: Secondary | ICD-10-CM

## 2017-09-11 DIAGNOSIS — I69398 Other sequelae of cerebral infarction: Secondary | ICD-10-CM

## 2017-09-11 LAB — CBC
HCT: 32.6 % — ABNORMAL LOW (ref 39.0–52.0)
Hemoglobin: 10.8 g/dL — ABNORMAL LOW (ref 13.0–17.0)
MCH: 30 pg (ref 26.0–34.0)
MCHC: 33.1 g/dL (ref 30.0–36.0)
MCV: 90.6 fL (ref 78.0–100.0)
Platelets: 161 10*3/uL (ref 150–400)
RBC: 3.6 MIL/uL — ABNORMAL LOW (ref 4.22–5.81)
RDW: 14.4 % (ref 11.5–15.5)
WBC: 7 10*3/uL (ref 4.0–10.5)

## 2017-09-11 LAB — BASIC METABOLIC PANEL
Anion gap: 7 (ref 5–15)
BUN: 74 mg/dL — ABNORMAL HIGH (ref 6–20)
CO2: 24 mmol/L (ref 22–32)
Calcium: 9 mg/dL (ref 8.9–10.3)
Chloride: 106 mmol/L (ref 101–111)
Creatinine, Ser: 2.76 mg/dL — ABNORMAL HIGH (ref 0.61–1.24)
GFR calc Af Amer: 24 mL/min — ABNORMAL LOW (ref >60)
GFR calc non Af Amer: 21 mL/min — ABNORMAL LOW (ref >60)
Glucose, Bld: 179 mg/dL — ABNORMAL HIGH (ref 65–99)
Potassium: 4.6 mmol/L (ref 3.5–5.1)
Sodium: 137 mmol/L (ref 135–145)

## 2017-09-11 LAB — GLUCOSE, CAPILLARY
Glucose-Capillary: 167 mg/dL — ABNORMAL HIGH (ref 65–99)
Glucose-Capillary: 132 mg/dL — ABNORMAL HIGH (ref 65–99)
Glucose-Capillary: 173 mg/dL — ABNORMAL HIGH (ref 65–99)
Glucose-Capillary: 171 mg/dL — ABNORMAL HIGH (ref 65–99)

## 2017-09-11 NOTE — Progress Notes (Signed)
   Curbside consult:  - Agree with stopping lisinopril given elevated creatinine. 1-9-2.0 may be baseline creatinine for the last few years. Would not resume unless renal feels appropriate (has seen renal in the past to help manage edema according to Dr. Caryl Comes prior office note. EF is normal.   - Avoid NSAIDS  - Agree with Coreg.  Thanks.   Candee Furbish, MD

## 2017-09-11 NOTE — Progress Notes (Signed)
Speech Language Pathology Discharge Summary  Patient Details  Name: David [name] MRN: [id number] Date of Birth: 1940/09/25  Today's Date: 09/11/2017 SLP Individual Time: 1000-1045 SLP Individual Time Calculation (min): 45 min   Skilled Therapeutic Interventions:   Skilled treatment session focused on cognition goals and completion of education with pt's wife. SLP facilitated session by providing review of calendar task that pt completed over weekend. Pt required supervision cues to self-monitor and self-correct. Education provided to pt's iwfe on nature of deficits and recommendation for Outpatient ST as well as 24 hour supervision d/t decreased safety awareness. Wife and pt voiced understanding.   Patient has met 4 of 4 long term goals.  Patient to discharge at overall Supervision level.   Clinical Impression/Discharge Summary:   Pt has made good progress in ST sessions and as a result he has met 4 of 4 LTGs. Pt is at supervision level of support d/t decreased safety awareness, decreased short term memory and high level problem solving. Recommend Outpatient St services to target high level cognitive function such as complex problem solving, alternating attention, deductive reasoning, medication management etc.  Care Partner:  Caregiver Able to Provide Assistance: Yes  Type of Caregiver Assistance: Cognitive  Recommendation:  Outpatient SLP;24 hour supervision/assistance  Rationale for SLP Follow Up: Maximize cognitive function and independence;Reduce caregiver burden   Equipment:     Reasons for discharge: Discharged from hospital;Treatment goals met   Patient/Family Agrees with Progress Made and Goals Achieved: Yes   Function:  Eating Eating   Modified Consistency Diet: No Eating Assist Level: No help, No cues           Cognition Comprehension Comprehension assist level: Follows basic conversation/direction with no assist  Expression   Expression assist level: Expresses  complex 90% of the time/cues < 10% of the time  Social Interaction Social Interaction assist level: Interacts appropriately 90% of the time - Needs monitoring or encouragement for participation or interaction.  Problem Solving Problem solving assist level: Solves basic problems with no assist;Solves complex 90% of the time/cues < 10% of the time  Memory Memory assist level: Recognizes or recalls 75 - 89% of the time/requires cueing 10 - 24% of the time   Abrie Egloff 09/11/2017, 11:45 AM

## 2017-09-11 NOTE — Progress Notes (Signed)
Occupational Therapy Session Note  Patient Details  Name: David [name] MRN: [id number] Date of Birth: 1941/04/10  Today's Date: 09/11/2017 OT Individual Time: 0900-1000 and 1500-1525 OT Individual Time Calculation (min): 60 min  And 25 min   Short Term Goals: Week 1:  OT Short Term Goal 1 (Week 1): STG=LTG d/t ELOS  Skilled Therapeutic Interventions/Progress Updates:    Session One: Pt seen for OT ADL bathing/dressing session. Pt sitting up in w/c upon arrival, agreeable to tx sesson and denying pain. He ambulated thorughout session with RW and uspervision, required cuing throughout for RW management in functional context with decreased carry over of education throughout. He gathered clothing items from drawer in prep for shower. Bathed seated on tub bench with distant supervision. He dressed seated on toilet, VCs for safety awareness/ education of fall risk as pt attempting to complete dressing from standing position.  Following seated rest break, he completed grooming tasks standing at sink.  Per pt request, completed NuStep activity for 8 minutes At level 7 for general strengthening and activity tolerance.  He then completed functional ambulation throughout unit, required mod cuing for problem solving how to side walk with RW through tight spaces to replicate home environment. Seated rest breaks required throughout due to decreased functional activity tolerance.  Pt returned to room at end of session, left seated in w/c awaiting hand off to SLP.   Session Two: Pt seen for OT session focusing on functional ambulation and caregiver training. Pt in supine upon arrival agreeable to tx session, pt's wife and dog present. Pt desiring to practice walking dog. He ambulated throughout unit using RW while also walking dog. VCs provided for safety awareness and RW management in functional context. Completed x2 trials around unit with seated rest break provided. Education provided throughout session  regarding energy conservation, DME, continuum of care, importance of participation with ADLs, increasing functional activity tolerance, and d/c planning.  Pt returned to w/c at end of session, QRB and chair alarm on.  Therapy Documentation Precautions:  Precautions Precautions: Fall Restrictions Weight Bearing Restrictions: No Pain:   No/denies pain ADL: ADL ADL Comments: See functional navigator  See Function Navigator for Current Functional Status.   Therapy/Group: Individual Therapy  Sartaj Hoskin L 09/11/2017, 7:12 AM

## 2017-09-11 NOTE — Progress Notes (Signed)
Physical Therapy Discharge Summary  Patient Details  Name: David [name] MRN: [id number] Date of Birth: 1940/07/06  Today's Date: 09/11/2017 PT Individual Time:1110  - 1210, 60 min individual tx Pain: none per pt    Patient has met 8 of 9 long term goals due to improved activity tolerance, improved balance, increased strength, ability to compensate for deficits, functional use of  left upper extremity and left lower extremity and improved awareness.  Patient to discharge at an ambulatory level Supervision.   Patient's care partner unavailable at last PT session today, but the family is planning  to provide the necessary safety/ cognitive assistance 24/7 at discharge. Pt does not have steps to negotiate at home.   Reasons goals not met: pt had some difficulty arising from floor  Recommendation:  Patient will benefit from ongoing skilled PT services in outpatient setting to continue to advance safe functional mobility, address ongoing impairments in balance, activity tolerance and coordination, and minimize fall risk.  Equipment: No equipment provided  Reasons for discharge: treatment goals met and discharge from hospital  Patient/family agrees with progress made and goals achieved: Yes  PT Discharge  Pt performed simulated car transfer with RW, retrieved a pen from the floor with RW, ambulated on level tile, stairs, and ramps.  Pt noted to have deficient balance strategies with posterior external perturbations: delayed bil ankle and hip strategies; he exhibited R stepping strategy.  Balance retraining standing on wedge for prolonged hamstring and heel cord stretching, and balance challenge, with resulting visible bil ankle strategy, x 1 minute without UE support, then x 3 minutes during L hand fine motor activity on rolling table in front of him.  Discussed safety at home, including fall recovery and use of 911 system.  Pt performed floor transfer to mat on floor, with min assist due to  difficulty clearing foot as he placed one foot under him to boost up.  Pt left resting in w/c with alarm set and all needs within reach, and set up for lunch. Precautions/Restrictions Precautions Precautions: Fall Restrictions Weight Bearing Restrictions: No Pain Pain Assessment Pain Scale: 0-10 Pain Score: 0-No pain Vision/Perception  Perception Perception: Within Functional Limits Praxis Praxis: Intact  Cognition Overall Cognitive Status: Impaired/Different from baseline Arousal/Alertness: Awake/alert Orientation Level: Oriented X4 Attention: Alternating Focused Attention: Appears intact Sustained Attention: Appears intact Alternating Attention: Impaired Alternating Attention Impairment: Verbal basic;Functional basic Memory: Impaired Memory Impairment: Decreased recall of new information Awareness: Impaired Awareness Impairment: Anticipatory impairment Problem Solving: Impaired Problem Solving Impairment: Verbal complex;Functional complex Executive Function: Organizing;Sequencing;Self Monitoring;Self Correcting;Decision Making Sequencing: Impaired Sequencing Impairment: Verbal complex;Functional complex Organizing: Impaired Organizing Impairment: Verbal complex;Functional complex Decision Making: Impaired Decision Making Impairment: Functional complex;Verbal complex Self Monitoring: Impaired Self Monitoring Impairment: Verbal complex;Functional complex Self Correcting: Impaired Self Correcting Impairment: Verbal complex;Functional complex Safety/Judgment: Impaired Comments: Decreased awareness of deficits; decreased safety awareness Sensation Sensation Light Touch: Impaired Detail Light Touch Impaired Details: Absent LLE;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; intact UEs) Proprioception: Appears Intact Coordination Gross Motor Movements are Fluid and Coordinated: Yes Fine Motor Movements are Fluid and Coordinated: no Coordination and Movement Description: decreased  speed and accuracy with alternating heel taps in sitting Motor  Motor Motor: Within Functional Limits  Mobility Bed Mobility Rolling Right: 6: Modified independent (Device/Increase time) Rolling Left: 6: Modified independent (Device/Increase time) Supine to Sit: 6: Modified independent (Device/Increase time) Sit to Supine: 6: Modified independent (Device/Increase time) Transfers Transfers: Yes Sit to Stand: 5: Supervision Stand to Sit: 5: Supervision Stand to Sit  Details: pt demonstrated safe use of RW 100%  Locomotion  Ambulation Ambulation: Yes Ambulation/Gait Assistance: 5: Supervision Ambulation Distance (Feet): 200 Feet Assistive device: Rolling walker(wide) Gait Gait: Yes Gait Pattern: Impaired Gait Pattern: Decreased dorsiflexion - right;Decreased dorsiflexion - left;Trunk flexed;Decreased trunk rotation;Wide base of support;Step-through pattern Stairs / Additional Locomotion Stairs: Yes Stairs Assistance: 5: Supervision Stairs Assistance Details: Verbal cues for precautions/safety Stairs Assistance Details (indicate cue type and reason): VCs for slowing down Stair Management Technique: One rail Left Number of Stairs: 12 Height of Stairs: 6(and 3) Ramp: 5: Supervision  Trunk/Postural Assessment  Cervical Assessment Cervical Assessment: Within Functional Limits Thoracic Assessment Thoracic Assessment: Within Functional Limits Lumbar Assessment Lumbar Assessment: Within Functional Limits Postural Control Postural Control: Within Functional Limits  Balance Balance Balance Assessed: Yes Dynamic Sitting Balance Dynamic Sitting - Balance Support: During functional activity;No upper extremity supported;Feet supported Dynamic Sitting - Level of Assistance: 6: Modified independent (Device/Increase time) Static Standing Balance Static Standing - Balance Support: No upper extremity supported;During functional activity Static Standing - Level of Assistance: 5: Stand by  assistance Dynamic Standing Balance Dynamic Standing - Balance Support: No upper extremity supported Dynamic Standing - Level of Assistance: 5: Stand by assistance Dynamic Standing - Comments: with external perturbations, pt exhibited inadequate bil ankle and hip strategy, and stepped back with R foot Extremity Assessment  RUE Assessment RUE Assessment: Within Functional Limits     LLE Strength Left Hip Flexion: 4+/5 Left Knee Flexion: 4+/5 Left Knee Extension: 5/5 Left Ankle Dorsiflexion: 4+/5 Left Ankle Plantar Flexion: 4+/5   See Function Navigator for Current Functional Status.  Ilina Xu 09/11/2017, 5:10 PM

## 2017-09-11 NOTE — Progress Notes (Signed)
[facility]     PROGRESS NOTE    Subjective/Complaints:  No issues ovenite Slept " better than ever" No orthopnea  ROS: Patient denies fever, rash, sore throat, blurred vision, nausea, vomiting, diarrhea, cough, shortness of breath or chest pain, joint or back pain, headache, or mood change.   Objective: Vital Signs: Blood pressure (!) 141/48, pulse 72, temperature 97.8 F (36.6 C), temperature source Oral, resp. rate 16, weight 124.4 kg (274 lb 4 oz), SpO2 98 %. No results found. No results for input(s): WBC, HGB, HCT, PLT in the last 72 hours. Recent Labs    09/11/17 0538  NA 137  K 4.6  CL 106  GLUCOSE 179*  BUN 74*  CREATININE 2.76*  CALCIUM 9.0   CBG (last 3)  Recent Labs    09/10/17 1134 09/10/17 1645 09/10/17 2103  GLUCAP 217* 134* 206*    Wt Readings from Last 3 Encounters:  09/11/17 124.4 kg (274 lb 4 oz)  08/30/17 130.2 kg (287 lb)  08/17/17 130.2 kg (287 lb)    Physical Exam:  Constitutional: No distress . Vital signs reviewed. HEENT: EOMI, oral membranes moist Neck: supple Cardiovascular: RRR without murmur. No JVD    Respiratory: CTA Bilaterally without wheezes or rales. Normal effort    GI: BS +, non-tender, non-distended    Musculoskeletal: He exhibits edema.    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 BLE with min edema     Neurological: He is alert. A cranial nerve deficit is present.  Left central 7 improving.  Speech clear. Oriented to self and place. asked when therapy starts this morning.  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 4/5 LUE , LLE 4- approximately two 4/5 distally, somewhat limited to knee pain. RUE and RLE 4+/5. Distal sensory loss in both feet present--stable neuro exam Skin: Skin is warm and dry. He is not diaphoretic.  Psychiatric:pleasant, reasonable insight and awareness.      Assessment/Plan: 1. 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 Physiatrist is providing close team supervision and 24 hour management of active medical problems listed below. Physiatrist and rehab team continue to assess barriers to discharge/monitor patient progress toward functional and medical goals.  Function:  Bathing Bathing position Bathing activity did not occur: Refused Position: Production manager parts bathed by patient: Right arm, Right lower leg, Left arm, Left lower leg, Chest, Abdomen, Back, Front perineal area, Buttocks, Right upper leg    Bathing assist Assist Level: Supervision or verbal cues      Upper Body Dressing/Undressing Upper body dressing Upper body dressing/undressing activity did not occur: Refused What is the patient wearing?: Pull over shirt/dress     Pull over shirt/dress - Perfomed by patient: Thread/unthread right sleeve, Thread/unthread left sleeve, Put head through opening, Pull shirt over trunk          Upper body assist Assist Level: Supervision or verbal cues      Lower Body Dressing/Undressing Lower body dressing Lower body dressing/undressing activity did not occur: Refused What is the patient wearing?: Underwear, Pants, Socks, Shoes Underwear - Performed by patient: Thread/unthread right underwear leg, Thread/unthread left underwear leg, Pull underwear up/down   Pants- Performed by patient: Thread/unthread right pants leg, Thread/unthread left pants leg, Pull pants up/down       Socks - Performed by patient: Don/doff right sock, Don/doff left sock   Shoes - Performed  by patient: Don/doff right shoe, Don/doff left shoe            Lower body assist Assist for lower body dressing: Supervision or verbal cues      Toileting Toileting Toileting activity did not occur: Refused Toileting steps completed by patient: Adjust clothing prior to toileting, Performs perineal hygiene, Adjust clothing after toileting    Toileting Assistive Devices: Grab bar or rail  Toileting assist Assist level: Touching or steadying assistance (Pt.75%)   Transfers Chair/bed transfer   Chair/bed transfer method: Ambulatory Chair/bed transfer assist level: Supervision or verbal cues Chair/bed transfer assistive device: Armrests     Locomotion Ambulation     Max distance: >200 ft Assist level: Supervision or verbal cues   Wheelchair   Type: Manual Max wheelchair distance: 150 ft w/BLE Assist Level: Supervision or verbal cues  Cognition Comprehension Comprehension assist level: Follows basic conversation/direction with no assist  Expression Expression assist level: Expresses complex 90% of the time/cues < 10% of the time  Social Interaction Social Interaction assist level: Interacts appropriately with others - No medications needed.  Problem Solving Problem solving assist level: Solves basic problems with no assist  Memory Memory assist level: Recognizes or recalls 75 - 89% of the time/requires cueing 10 - 24% of the time   Medical Problem List and Plan:  1. 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 DVT Prophylaxis/Anticoagulation: Pharmaceutical: Lovenox  3. Pain Management/OA left knee ---improved -tylenol or tramadol  - prn. Ice  -voltaren gel  --Ongoing strengthening with therapy 4. Mood: LCSW to follow for evaluation and support. Mood stable on home dose Zoloft.  5. Neuropsych: This patient is not capable of making decisions on his own behalf.  6. Skin/Wound Care: routine pressure relief measures.  7. Fluids/Electrolytes/Nutrition:   -encourage PO   8. HCM/PPM: Recent syncope with lack of pulse---monitor orthostatic vitals. Continue low dose coreg.  9. Chronic diastolic CHF: Heart healthy diet with daily weights. Lisinopril and Cardura on hold to prevent recurrent syncope/hypotension. Back on torsemide. Continue ASA, Lipitor  and coreg. No signs of  fluid overload. WIll not resume lisinopril at this time due to Creat sl increased over baseline    - TEDs for edema control.    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 Monitor BS ac/hs and follow for pattern .   -Continue lantus insulin with meal coverage--Cont lantus 18u bid   -Improved and stable control at present   -continue 4u novolog tid   -follow up with Dr. Dwyane Dee after discharge for instructions on resumption of pump  CBG (last 3)  Recent Labs    09/10/17 1134 09/10/17 1645 09/10/17 2103  GLUCAP 217* 134* 206*  11. OSA: Continue CPAP at nights/when asleep.  12. Asymptomatic tachycardia/NSVT: Monitor for symptoms. Coreg titrated at home dose   -hgb stable at 10.4     LOS (Days) 7 A FACE TO FACE EVALUATION WAS PERFORMED  Charlett Blake, MD 09/11/2017 6:42 AM

## 2017-09-11 NOTE — Progress Notes (Signed)
Occupational Therapy Discharge Summary  Patient Details  Name: David [name] MRN: [id number] Date of Birth: [birth year]  Patient has met 8 of 8 long term goals due to improved activity tolerance, improved balance, postural control, functional use of  LEFT upper extremity, improved attention, improved awareness and improved coordination.  Patient to discharge at overall Supervision level.  Patient's care partner is independent to provide the necessary physical and cognitive assistance at discharge.  Recommending suprvision assist due to cognitive deficits. Recommending use of RW at d/c, pt requires cuing for appropriate use of RW in functional contexts. Pt's family has been present throughout rehab admission and are aware of his current physical and cognitive deficits. They voiced understanding of his need for 24 hr assist and are willing/able to provide this assist at d/c.   Recommendation:  Patient will benefit from ongoing skilled OT services in outpatient setting to continue to advance functional skills in the area of BADL and iADL.  Equipment: Pt has all needed DME from previous hospital admissions. Recommend use of tub transfer bench at d/c.   Reasons for discharge: treatment goals met and discharge from hospital  Patient/family agrees with progress made and goals achieved: Yes  OT Discharge Precautions/Restrictions  Precautions Precautions: Fall Restrictions Weight Bearing Restrictions: No ADL ADL ADL Comments: See functional navigator Vision Baseline Vision/History: Wears glasses;Macular Degeneration Patient Visual Report: No change from baseline Vision Assessment?: No apparent visual deficits Perception  Perception: Within Functional Limits Praxis Praxis: Intact Cognition Overall Cognitive Status: Impaired/Different from baseline Arousal/Alertness: Awake/alert Orientation Level: Oriented X4 Focused Attention: Appears intact Sustained Attention: Appears intact Memory:  Impaired Memory Impairment: Decreased recall of new information Awareness: Impaired Awareness Impairment: Emergent impairment Problem Solving: Impaired Problem Solving Impairment: Verbal complex;Functional complex Executive Function: Organizing;Sequencing Sequencing: Impaired Sequencing Impairment: Functional complex;Verbal complex Organizing: Impaired Safety/Judgment: Impaired Comments: Decreased awareness of deficits; decreased safety awareness Sensation Sensation Light Touch: Impaired Detail Light Touch Impaired Details: Absent LLE;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; intact UEs) Proprioception: Appears Intact Coordination Gross Motor Movements are Fluid and Coordinated: Yes Fine Motor Movements are Fluid and Coordinated: Yes Coordination and Movement Description: Generalized weakness/ deconditioning Motor  Motor Motor: Within Functional Limits Trunk/Postural Assessment  Cervical Assessment Cervical Assessment: Within Functional Limits Thoracic Assessment Thoracic Assessment: Within Functional Limits Lumbar Assessment Lumbar Assessment: Within Functional Limits Postural Control Postural Control: Within Functional Limits  Balance Balance Balance Assessed: Yes Dynamic Sitting Balance Dynamic Sitting - Balance Support: During functional activity;No upper extremity supported;Feet supported Dynamic Sitting - Level of Assistance: 6: Modified independent (Device/Increase time) Static Standing Balance Static Standing - Balance Support: No upper extremity supported;During functional activity Static Standing - Level of Assistance: 5: Stand by assistance Dynamic Standing Balance Dynamic Standing - Balance Support: During functional activity;Right upper extremity supported;Left upper extremity supported Dynamic Standing - Level of Assistance: 5: Stand by assistance Dynamic Standing - Comments: Standing to complete functional activity Extremity/Trunk Assessment RUE Assessment RUE  Assessment: Within Functional Limits LUE Assessment LUE Assessment: Within Functional Limits   See Function Navigator for Current Functional Status.  Elspeth Blucher L 09/11/2017, 9:43 AM

## 2017-09-12 LAB — GLUCOSE, CAPILLARY
Glucose-Capillary: 158 mg/dL — ABNORMAL HIGH (ref 65–99)
Glucose-Capillary: 220 mg/dL — ABNORMAL HIGH (ref 65–99)

## 2017-09-12 MED ORDER — PEN NEEDLES 32G X 5 MM MISC: 1.0000 "application " | Freq: Four times a day (QID) | 1 refills | Status: DC

## 2017-09-12 MED ORDER — CARVEDILOL 25 MG PO TABS: 25.0000 mg | ORAL_TABLET | Freq: Two times a day (BID) | ORAL | 0 refills | Status: DC

## 2017-09-12 MED ORDER — ACETAMINOPHEN 325 MG PO TABS: 325.0000 mg | ORAL_TABLET | ORAL | Status: DC | PRN

## 2017-09-12 MED ORDER — INSULIN ASPART 100 UNIT/ML CARTRIDGE (PENFILL): 5.0000 [IU] | Freq: Three times a day (TID) | SUBCUTANEOUS | 11 refills | Status: DC

## 2017-09-12 MED ORDER — ENOXAPARIN SODIUM 30 MG/0.3ML ~~LOC~~ SOLN: 30.0000 mg | SUBCUTANEOUS | Status: DC

## 2017-09-12 MED ORDER — TORSEMIDE 10 MG PO TABS: 10.0000 mg | ORAL_TABLET | Freq: Two times a day (BID) | ORAL | 0 refills | Status: DC

## 2017-09-12 MED ORDER — CLOPIDOGREL BISULFATE 75 MG PO TABS: 75.0000 mg | ORAL_TABLET | Freq: Every day | ORAL | 0 refills | Status: DC

## 2017-09-12 MED ORDER — BASAGLAR KWIKPEN 100 UNIT/ML ~~LOC~~ SOPN: 18.0000 [IU] | PEN_INJECTOR | Freq: Two times a day (BID) | SUBCUTANEOUS | 0 refills | Status: DC

## 2017-09-12 MED ORDER — INSULIN GLARGINE 100 UNIT/ML SOLOSTAR PEN: 18.0000 [IU] | PEN_INJECTOR | Freq: Two times a day (BID) | SUBCUTANEOUS | 11 refills | Status: DC

## 2017-09-12 MED ORDER — ISOSORBIDE MONONITRATE ER 30 MG PO TB24: 30.0000 mg | ORAL_TABLET | Freq: Every day | ORAL | 0 refills | Status: DC

## 2017-09-12 MED ORDER — DICLOFENAC SODIUM 1 % TD GEL: 2.0000 g | Freq: Three times a day (TID) | TRANSDERMAL | 0 refills | Status: DC

## 2017-09-12 NOTE — Discharge Instructions (Signed)
Inpatient Rehab Discharge Instructions  David [name] Discharge date and time: 09/02/17   Activities/Precautions/ Functional Status: Activity: no lifting, driving, or strenuous exercise till cleared by MD Diet: cardiac diet and diabetic diet Wound Care: none needed   Functional status:  ___ No restrictions     ___ Walk up steps independently _X__ 24/7 supervision/assistance   ___ Walk up steps with assistance ___ Intermittent supervision/assistance  ___ Bathe/dress independently ___ Walk with walker     ___ Bathe/dress with assistance ___ Walk Independently    ___ Shower independently _X__ Walk with supervision.     ___ Shower with assistance _X__ No alcohol     ___ Return to work/school ________   Special Instructions: 1. Monitor blood sugars before meals and at bedtime.  2. Use sliding scale below for blood sugar range 120- 150---add 1 units to meal coverage 151- 200---add 2 units to meal coverage. 201-250--- add 4 units to meal coverage 251- 300---add 6 units to meal coverage If blood sugars are running over 300 call Dr. Dwyane Dee for input.  3. Stop taking Aspirin after May 10th.  4. Need to set appointment with nephrology (kidney doctor) for follow up on renal status.  5. Wife needs to assist patient with medication management and go with him to follow up appointment.  6. I sent prescriptions for Basaglar and Lantus--you can decide which brand is cheaper to fill. You can also decide if one or two pens are cheaper than 5 at a time.    COMMUNITY REFERRALS UPON DISCHARGE:   Outpatient: PT     OT     ST  Agency:  [facility] Phone:  [phone]  Appointment Date/Time:  Monday, May 6 at 1:15 PM for PT and 2:00 PM for OT (arrive at 12:45 PM);  Thursday, May 23 at 2:45 PM for Speech (they hope to get you in sooner) Medical Equipment/Items Ordered:  You have all recommended equipment at home   [place] PATIENT/FAMILY: Support  Groups:  [facility] Stroke Support Group                              Meets the 2nd Thursday of each month from 3-4PM (except June, July, and August)                              In the dayroom of Inpatient Rehab at [facility]                              For more information, call [phone] or email [email or web address]   STROKE/TIA DISCHARGE INSTRUCTIONS SMOKING Cigarette smoking nearly doubles your risk of having a stroke & is the single most alterable risk factor  If you smoke or have smoked in the last 12 months, you are advised to quit smoking for your health.  Most of the excess cardiovascular risk related to smoking disappears within a year of stopping.  Ask you doctor about anti-smoking medications  [place] Quit Line: 1-800-QUIT NOW  Free Smoking Cessation Classes [phone]  CHOLESTEROL Know your levels; limit fat & cholesterol in your diet  Lipid Panel     Component Value Date/Time   CHOL 165 08/31/2017 0500   TRIG 195 (H) 08/31/2017 0500   HDL 20 (L) 08/31/2017 0500   CHOLHDL 8.3  08/31/2017 0500   VLDL 39 08/31/2017 0500   LDLCALC 106 (H) 08/31/2017 0500      Many patients benefit from treatment even if their cholesterol is at goal.  Goal: Total Cholesterol (CHOL) less than 160  Goal:  Triglycerides (TRIG) less than 150  Goal:  HDL greater than 40  Goal:  LDL (LDLCALC) less than 100   BLOOD PRESSURE American Stroke Association blood pressure target is less that 120/80 mm/Hg  Your discharge blood pressure is:  BP: (!) 164/58  Monitor your blood pressure  Limit your salt and alcohol intake  Many individuals will require more than one medication for high blood pressure  DIABETES (A1c is a blood sugar average for last 3 months) Goal HGBA1c is under 7% (HBGA1c is blood sugar average for last 3 months)  Diabetes:     Lab Results  Component Value Date   HGBA1C 7.6 (H) 08/31/2017     Your HGBA1c can be lowered with medications,  healthy diet, and exercise.  Check your blood sugar as directed by your physician  Call your physician if you experience unexplained or low blood sugars.  PHYSICAL ACTIVITY/REHABILITATION Goal is 30 minutes at least 4 days per week  Activity: No driving, Therapies: see above Return to work: N/A  Activity decreases your risk of heart attack and stroke and makes your heart stronger.  It helps control your weight and blood pressure; helps you relax and can improve your mood.  Participate in a regular exercise program.  Talk with your doctor about the best form of exercise for you (dancing, walking, swimming, cycling).  DIET/WEIGHT Goal is to maintain a healthy weight  Your discharge diet is:  Diet Order           Diet heart healthy/carb modified Room service appropriate? Yes; Fluid consistency: Thin  Diet effective now         liquids Your height is:    Your current weight is: Weight: 124 kg (273 lb 5.9 oz) Your Body Mass Index (BMI) is:  BMI (Calculated): 33.29  Following the type of diet specifically designed for you will help prevent another stroke.  Your goal weight is:    Your goal Body Mass Index (BMI) is 19-24.  Healthy food habits can help reduce 3 risk factors for stroke:  High cholesterol, hypertension, and excess weight.  RESOURCES Stroke/Support Group:  Call [phone]   STROKE EDUCATION PROVIDED/REVIEWED AND GIVEN TO PATIENT Stroke warning signs and symptoms How to activate emergency medical system (call 911). Medications prescribed at discharge. Need for follow-up after discharge. Personal risk factors for stroke. Pneumonia vaccine given:  Flu vaccine given:  My questions have been answered, the writing is legible, and I understand these instructions.  I will adhere to these goals & educational materials that have been provided to me after my discharge from the hospital.     My questions have been answered and I understand these instructions. I will adhere to  these goals and the provided educational materials after my discharge from the hospital.  Patient/Caregiver Signature _______________________________ Date __________  Clinician Signature _______________________________________ Date __________  Please bring this form and your medication list with you to all your follow-up doctor's appointments.

## 2017-09-12 NOTE — Discharge Summary (Signed)
Physician Discharge Summary  Patient ID: David [name] MRN: [id number] DOB/AGE: 14-Nov-1940 77 y.o.  Admit date: 09/04/2017 Discharge date: 09/12/2017  Discharge Diagnoses:  Principal Problem:   Ischemic stroke of frontal lobe [facility]) Active Problems:   Hypertension associated with diabetes ([place])   Morbid obesity ([place])   CKD stage 3 due to type 2 diabetes mellitus ([place])   Diabetes mellitus with neurological manifestations, uncontrolled ([place])   HOCM (hypertrophic obstructive cardiomyopathy) ([place])   Discharged Condition: stable   Significant Diagnostic Studies: Dg Knee 1-2 Views Left  Result Date: 09/04/2017 CLINICAL DATA:  77 year old male with generalized left knee pain for 2 months. No injury. Initial encounter. EXAM: LEFT KNEE - 1-2 VIEW COMPARISON:  None. FINDINGS: Marked medial tibiofemoral joint space narrowing and degenerative changes. Mild patellofemoral joint degenerative changes. Small to moderate-size suprapatellar joint effusion. No fracture or dislocation. Vascular calcifications. IMPRESSION: Marked medial tibiofemoral joint degenerative changes. Mild patellofemoral joint degenerative changes. Small to moderate-size suprapatellar joint effusion. Electronically Signed   By: [name]   On: 09/04/2017 17:55    Dg Chest Port 1 View  Result Date: 09/02/2017 CLINICAL DATA:  Shortness of breath for 1 day.  Syncopal episode. EXAM: PORTABLE CHEST 1 VIEW COMPARISON:  CT chest 07/23/2015. PA and lateral chest 08/31/2017 and 06/29/2017. FINDINGS: Pacing device is unchanged. Heart size is upper normal. Aortic atherosclerosis is seen. The lungs appear hyperexpanded but are clear. Prominent epicardial fat at the left cardiophrenic angle noted. No acute bony abnormality. IMPRESSION: No acute disease. Atherosclerosis. Pulmonary hyperexpansion suggestive of emphysema. Electronically Signed   By: Inge Rise [name]   On: 09/02/2017 14:35    Labs:  Basic Metabolic Panel: BMP Latest  Ref Rng & Units 09/11/2017 09/05/2017 09/03/2017  Glucose 65 - 99 mg/dL 179(H) 176(H) 147(H)  BUN 6 - 20 mg/dL 74(H) 53(H) 52(H)  Creatinine 0.61 - 1.24 mg/dL 2.76(H) 2.39(H) 2.39(H)  BUN/Creat Ratio 6 - 22 (calc) - - -  Sodium 135 - 145 mmol/L 137 135 138  Potassium 3.5 - 5.1 mmol/L 4.6 4.0 4.2  Chloride 101 - 111 mmol/L 106 104 106  CO2 22 - 32 mmol/L 24 22 22   Calcium 8.9 - 10.3 mg/dL 9.0 8.7(L) 8.4(L)    CBC: CBC Latest Ref Rng & Units 09/11/2017 09/05/2017 09/03/2017  WBC 4.0 - 10.5 K/uL 7.0 6.2 7.2  Hemoglobin 13.0 - 17.0 g/dL 10.8(L) 10.4(L) 11.0(L)  Hematocrit 39.0 - 52.0 % 32.6(L) 31.8(L) 33.9(L)  Platelets 150 - 400 K/uL 161 156 170    CBG: Recent Labs  Lab 09/11/17 1216 09/11/17 1639 09/11/17 2126 09/12/17 0634 09/12/17 1141  GLUCAP 132* 173* 171* 158* 220*    Brief HPI:   David [name] is a 77 year old right-handed male with history of type II DM, coronary artery disease, HCM status post permanent pacemaker, CKD stage III, age-related macular degeneration; who was admitted on 08/31/2016 with confusion left-sided numbness and weakness as well as speech difficulty.  MRI of brain done revealing 2 small areas of restricted diffusion in right frontal cortex compatible with acute infarct. TEE showed normal LVEF, no PFO no thrombus and fixed plaque in thoracic aorta.  Throat stroke felt to be embolic due to unknown source and aspirin Plavix recommended x3 weeks followed by Plavix alone.  Hospital course significant for episode of syncope with fall and lack of pulse requiring CPR x1 minute as well as dose of atropine with return of pulse.  PPM was interrogated and no A. fib noted.  Syncope felt  to be due to hypotension and medications were adjusted.  He has had few beats of asymptomatic NSVT therefore Coreg was resumed as well as addition of  torsemide  prior to discharge. His bouts of agitation were resolving and therapy was ongoing.  Patient noted to have cognitive deficits as well  as weakness affecting ADLs as well as mobility.  CIR was recommended due to functional deficits.   Hospital Course: David [name] was admitted to rehab 09/04/2017 for inpatient therapies to consist of PT, ST and OT at least three hours five days a week. Past admission physiatrist, therapy team and rehab RN have worked together to provide customized collaborative inpatient rehab. He has been compliant with CPAP use during his stay.  Blood pressures have been monitored on bid basis and have been reasonable. No orthostatic symptoms reported with increase in activity. Weights were monitored daily and is down ot 273 lbs.  Knee pain has been managed with local measures and addition of Voltaren gel.   Renal status has been monitored with serial checks with rise in SCr noted. Lisinopril was discontinued and cardiology was consulted for input on medications prior to discharge. Dr. Etter Sjogren felt that SCr of 1.9- 2.0 may be patient's new baseline. He agreed with d/c of lisinopril and recommended follow up with nephrology. Diabetes has been monitored with ac/hs cbg checks. Po intake has been good and lantus was titrated up to 18 units with 5 units meal coverage tid. Patient and wife were instructed on monitoring BS ac/hs at discharge and using SSI additionally for tighter control. Wife was instructed to set follow up with Dr. Dwyane Dee for appointment and  input on resumption of insulin pump.   He has progressed to supervision level and will continue to receive follow up outpatient PT and ST at [place] after discharge.    Rehab course: During patient's stay in rehab weekly team conferences were held to monitor patient's progress, set goals and discuss barriers to discharge. At admission, patient required min guard assist with mobility and supervision with basic self care tasks. He exhibited mild to moderate cognitive deficits with MoCA score 23/30, displayed delayed processing with inability to correct errors. He   has had improvement in activity tolerance, balance, postural control as well as ability to compensate for deficits. He has had improvement in functional use LUE  and  LLE as well as improvement in awareness.  He is able to complete ADL tasks with supervision.  He requires supervision with transfers and is able to ambulate 200' with wide RW. He needs supervision due to decreased safety awareness, decreased STM as well as problems with high level cognitive tasks. Family education was completed regarding all aspects of safety with mobility as well as assistance needed with cognitive tasks.   Disposition: Home   Diet: Carb modified/Heart healthy.   Special Instructions: 1. Repeat BMET in 5-7 days to monitor renal status. 2. Monitor BS ac/hs and use SSI as instructed. 3. Family needs to assist with medication management and set appointment with nephrology for follow up on CKD.  4. Stop ASA after May 10th.   Discharge Instructions    Ambulatory referral to Physical Medicine Rehab   Complete by:  As directed    1-2 weeks transitional care appt     Allergies as of 09/12/2017      Reactions   Adhesive [tape] Other (See Comments)   blisters      Medication List    STOP taking these  medications   amLODipine 10 MG tablet Commonly known as:  NORVASC   Fish Oil 1000 MG Caps   Icosapent Ethyl 1 g Caps Commonly known as:  VASCEPA   insulin regular human CONCENTRATED 500 UNIT/ML injection Commonly known as:  HUMULIN R   liraglutide 18 MG/3ML Sopn Commonly known as:  VICTOZA   traMADol 50 MG tablet Commonly known as:  ULTRAM     TAKE these medications   acetaminophen 325 MG tablet Commonly known as:  TYLENOL Take 1-2 tablets (325-650 mg total) by mouth every 4 (four) hours as needed for mild pain.   aspirin 325 MG tablet Take 1 tablet (325 mg total) by mouth daily. Notes to patient:  STOP TAKING ASPIRIN AFTER Friday MAY 10TH.    atorvastatin 10 MG tablet Commonly known as:   LIPITOR Take 1 tablet (10 mg total) by mouth daily at 6 PM.   calcitRIOL 0.25 MCG capsule Commonly known as:  ROCALTROL Take 0.25 mcg by mouth daily.   carvedilol 25 MG tablet Commonly known as:  COREG Take 1 tablet (25 mg total) by mouth 2 (two) times daily with a meal.   cholecalciferol 1000 units tablet Commonly known as:  VITAMIN D Take 1 tablet (1,000 Units total) by mouth daily. What changed:  when to take this   clopidogrel 75 MG tablet Commonly known as:  PLAVIX Take 1 tablet (75 mg total) by mouth daily.   diclofenac sodium 1 % Gel Commonly known as:  VOLTAREN Apply 2 g topically 3 (three) times daily.   hydrOXYzine 50 MG tablet Commonly known as:  ATARAX/VISTARIL Take 1 tablet (50 mg total) 3 (three) times daily as needed by mouth.   insulin aspart cartridge Commonly known as:  NOVOLOG PENFILL Inject 5 Units into the skin 3 (three) times daily with meals.   Insulin Glargine 100 UNIT/ML Solostar Pen Commonly known as:  LANTUS Inject 18 Units into the skin 2 (two) times daily.   isosorbide mononitrate 30 MG 24 hr tablet Commonly known as:  IMDUR Take 1 tablet (30 mg total) by mouth daily. What changed:  how much to take   pantoprazole 40 MG tablet Commonly known as:  PROTONIX TAKE ONE TABLET BY MOUTH ONCE DAILY   Pen Needles 32G X 5 MM Misc 1 application by Does not apply route 4 (four) times daily.   sertraline 100 MG tablet Commonly known as:  ZOLOFT Take 100 mg by mouth daily.   torsemide 10 MG tablet Commonly known as:  DEMADEX Take 1 tablet (10 mg total) by mouth 2 (two) times daily. What changed:  medication strength   vitamin B-12 100 MCG tablet Commonly known as:  CYANOCOBALAMIN Take 100 mcg by mouth daily.      Follow-up Information    [name], MD. Go on 09/26/2017.   Specialty:  Family Medicine Why:  @ 11AM Contact information: [address] [place] [address] [name] [address] [phone]           Signed: Bary Leriche 09/12/2017, 5:26 PM

## 2017-09-12 NOTE — Progress Notes (Signed)
[place] PHYSICAL MEDICINE & REHABILITATION     PROGRESS NOTE    Subjective/Complaints: The patient is excited about going home today.  I appreciate cardiology follow-up    ROS: Patient denies fever, rash, sore throat, blurred vision, nausea, vomiting, diarrhea, cough, shortness of breath or chest pain, joint or back pain, headache, or mood change.   Objective: Vital Signs: Blood pressure (!) 164/58, pulse 63, temperature 98.4 F (36.9 C), temperature source Oral, resp. rate 12, weight 124 kg (273 lb 5.9 oz), SpO2 98 %. No results found. Recent Labs    09/11/17 0538  WBC 7.0  HGB 10.8*  HCT 32.6*  PLT 161   Recent Labs    09/11/17 0538  NA 137  K 4.6  CL 106  GLUCOSE 179*  BUN 74*  CREATININE 2.76*  CALCIUM 9.0   CBG (last 3)  Recent Labs    09/11/17 1639 09/11/17 2126 09/12/17 0634  GLUCAP 173* 171* 158*    Wt Readings from Last 3 Encounters:  09/12/17 124 kg (273 lb 5.9 oz)  08/30/17 130.2 kg (287 lb)  08/17/17 130.2 kg (287 lb)    Physical Exam:  Constitutional: No distress . Vital signs reviewed. HEENT: EOMI, oral membranes moist Neck: supple Cardiovascular: RRR without murmur. No JVD    Respiratory: CTA Bilaterally without wheezes or rales. Normal effort    GI: BS +, non-tender, non-distended    Musculoskeletal: He exhibits edema.    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 BLE with min edema     Neurological: He is alert. A cranial nerve deficit is present.  Left central 7 improving.  Speech clear. Oriented to self and place. asked when therapy starts this morning.  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 4/5 LUE , LLE 4- approximately two 4/5 distally, somewhat limited to knee pain. RUE and RLE 4+/5. Distal sensory loss in both feet present--stable neuro exam Skin: Skin is warm and dry. He is not diaphoretic.  Psychiatric:pleasant, reasonable insight and awareness.      Assessment/Plan: 1. Functional deficits secondary to right frontal CVA  Stable  for D/C today F/u PCP in 3-4 weeks F/u PM&R 2 weeks See D/C summary See D/C instructions Function:  Bathing Bathing position Bathing activity did not occur: Refused Position: Shower  Bathing parts Body parts bathed by patient: Right arm, Right lower leg, Left arm, Left lower leg, Chest, Abdomen, Back, Front perineal area, Buttocks, Right upper leg    Bathing assist Assist Level: Supervision or verbal cues      Upper Body Dressing/Undressing Upper body dressing Upper body dressing/undressing activity did not occur: Refused What is the patient wearing?: Pull over shirt/dress     Pull over shirt/dress - Perfomed by patient: Thread/unthread right sleeve, Thread/unthread left sleeve, Put head through opening, Pull shirt over trunk          Upper body assist Assist Level: More than reasonable time      Lower Body Dressing/Undressing Lower body dressing Lower body dressing/undressing activity did not occur: Refused What is the patient wearing?: Underwear, Pants, Shoes Underwear - Performed by patient: Thread/unthread right underwear leg, Thread/unthread left underwear leg, Pull underwear up/down   Pants- Performed by patient: Thread/unthread right pants leg, Thread/unthread left pants leg, Pull pants up/down       Socks - Performed by patient: Don/doff right sock, Don/doff left sock   Shoes - Performed by patient: Don/doff right shoe, Don/doff left shoe(Slide ons)  Lower body assist Assist for lower body dressing: Supervision or verbal cues      Toileting Toileting Toileting activity did not occur: Refused Toileting steps completed by patient: Adjust clothing prior to toileting, Performs perineal hygiene, Adjust clothing after toileting   Toileting Assistive Devices: Grab bar or rail  Toileting assist Assist level: Supervision or verbal cues   Transfers Chair/bed transfer   Chair/bed transfer method: Ambulatory Chair/bed transfer assist level: Supervision  or verbal cues Chair/bed transfer assistive device: Armrests     Locomotion Ambulation     Max distance: 200 Assist level: Supervision or verbal cues   Wheelchair   Type: Manual Max wheelchair distance: 150 ft w/BLE Assist Level: Supervision or verbal cues  Cognition Comprehension Comprehension assist level: Follows basic conversation/direction with no assist  Expression Expression assist level: Expresses complex 90% of the time/cues < 10% of the time  Social Interaction Social Interaction assist level: Interacts appropriately 90% of the time - Needs monitoring or encouragement for participation or interaction.  Problem Solving Problem solving assist level: Solves basic problems with no assist, Solves complex 90% of the time/cues < 10% of the time  Memory Memory assist level: Recognizes or recalls 75 - 89% of the time/requires cueing 10 - 24% of the time   Medical Problem List and Plan:  1. Functional deficits secondary to right frontal CVA  -Continue PT, OT, ST-- Discharge home today 2. DVT Prophylaxis/Anticoagulation: Pharmaceutical: Lovenox  3. Pain Management/OA left knee ---improved -tylenol or tramadol  - prn. Ice  -voltaren gel  --Ongoing strengthening with therapy 4. Mood: LCSW to follow for evaluation and support. Mood stable on home dose Zoloft.  5. Neuropsych: This patient is not capable of making decisions on his own behalf.  6. Skin/Wound Care: routine pressure relief measures.  7. Fluids/Electrolytes/Nutrition:   -encourage PO   8. HCM/PPM: Recent syncope with lack of pulse---monitor orthostatic vitals. Continue low dose coreg.  9. Chronic diastolic CHF: Heart healthy diet with daily weights. Lisinopril and Cardura on hold to prevent recurrent syncope/hypotension. Back on torsemide. Continue ASA, Lipitor  and coreg. No signs of fluid overload. WIll not resume lisinopril at this time due to Creat sl increased over baseline  - appreciate Cardiology note  - TEDs for  edema control.    -weights fluctuating, ?technique Filed Weights   09/10/17 0533 09/11/17 0526 09/12/17 0634  Weight: 119.9 kg (264 lb 5.3 oz) 124.4 kg (274 lb 4 oz) 124 kg (273 lb 5.9 oz)    10 T2DM with neuropathy and nephropathy: Was on insulin pump at home should be able to resume use after discharge, will need follow-up appointment with Dr. Dwyane Dee from endocrinology. Monitor BS ac/hs and follow for pattern .   -Continue lantus insulin with meal coverage--Cont lantus 18u bid   -Improved and stable control at present   -continue 4u novolog tid  CBG (last 3)  Recent Labs    09/11/17 1639 09/11/17 2126 09/12/17 0634  GLUCAP 173* 171* 158*  11. OSA: Continue CPAP at nights/when asleep.  12. Asymptomatic tachycardia/NSVT: Monitor for symptoms. Coreg titrated at home dose   -hgb stable at 10.4     LOS (Days) 8 A FACE TO FACE EVALUATION WAS PERFORMED  Charlett Blake, MD 09/12/2017 8:04 AM

## 2017-09-12 NOTE — Progress Notes (Signed)
Pt being discharged home via wheelchair with family. Pt alert and oriented x4. VSS. Pt c/o no pain at this time. No signs of respiratory distress. Education complete and care plans resolved. No further issues at this time. Pt to follow up with PCP. Leanne Chang, RN

## 2017-09-12 NOTE — Progress Notes (Signed)
Patient has home CPAP unit at bedside. Distilled H20 added to chamber for humidity. Patient is able to  place himself on/off as needed.

## 2017-09-13 ENCOUNTER — Other Ambulatory Visit: Payer: Self-pay | Admitting: Family Medicine

## 2017-09-13 ENCOUNTER — Telehealth: Payer: Self-pay | Admitting: *Deleted

## 2017-09-13 NOTE — Telephone Encounter (Signed)
Left voicemail requesting call back.  

## 2017-09-13 NOTE — Telephone Encounter (Signed)
Request for medication started in hospital: Lipitor 10 mg  LOV: 06/29/17  PCP: [place]: verified

## 2017-09-13 NOTE — Progress Notes (Signed)
Social Work Discharge Note  The overall goal for the admission was met for:   Discharge location: Yes - home with family  Length of Stay: Yes - 8 days   Discharge activity level: Yes - supervision  Home/community participation: Yes  Services provided included: MD, RD, PT, OT, SLP, RN, Pharmacy, Neuropsych and SW  Financial Services: Medicare and Private Insurance: [employer] [place]  Follow-up services arranged: Outpatient: PT/OT/ST at [facility], DME: Pt had all recommended DME at home from a previous orthopedic surgery. and Patient/Family has no preference for HH/DME agencies  Comments (or additional information): Pt's family to provide supervision and family will hire caregivers for pt when his wife has surgery 09-22-17 and through her recovery.  Patient/Family verbalized understanding of follow-up arrangements: Yes  Individual responsible for coordination of the follow-up plan: pt and his wife  Confirmed correct DME delivered: Trey Sailors 09/13/2017    Darleene Cumpian, Silvestre Mesi

## 2017-09-13 NOTE — Telephone Encounter (Signed)
Copied from [place] [phone]. Topic: Quick Communication - See Telephone Encounter >> Sep 13, 2017 10:03 AM Ivar Drape wrote: CRM for notification. See Telephone encounter for: 09/13/17. Patient was released from [facility] yesterday and is in need for a prescription to be called into [facility] on [address] for Lipitor 10mg , 1 tablet daily at 6pm.

## 2017-09-13 NOTE — Telephone Encounter (Signed)
Please advise 

## 2017-09-14 ENCOUNTER — Other Ambulatory Visit: Payer: Self-pay

## 2017-09-14 ENCOUNTER — Telehealth: Payer: Self-pay | Admitting: Registered Nurse

## 2017-09-14 MED ORDER — ATORVASTATIN CALCIUM 10 MG PO TABS: 10.0000 mg | ORAL_TABLET | Freq: Every day | ORAL | 0 refills | Status: DC

## 2017-09-14 MED ORDER — ATORVASTATIN CALCIUM 10 MG PO TABS: 10.0000 mg | ORAL_TABLET | Freq: Every day | ORAL | Status: DC

## 2017-09-14 NOTE — Telephone Encounter (Signed)
Transitional Care Call, no answer. Left message to return the call.

## 2017-09-14 NOTE — Telephone Encounter (Signed)
Left voicemail requesting call back.  

## 2017-09-14 NOTE — Telephone Encounter (Signed)
done

## 2017-09-14 NOTE — Telephone Encounter (Signed)
Transitional Care call Transitional Care Call Completed, Appointment Confirmed, Address Confirmed, New Patient Packet Mailed Transitional Care Call questions answered by Mrs. Leibold.  Patient name: David [name] DOB: 06-Jun-1940 1. Are you/is patient experiencing any problems since coming home? No a. Are there any questions regarding any aspect of care? No 2. Are there any questions regarding medications administration/dosing? No a. Are meds being taken as prescribed? Yes b. "Patient should review meds with caller to confirm" Medication List Reviewed 3. Have there been any falls? No 4. Has Home Health been to the house and/or have they contacted you? He's scheduled for [facility] Neuro[facility]Rehabilitation appointment on 09/18/2017 a. If not, have you tried to contact them? NA b. Can we help you contact them? NA 5. Are bowels and bladder emptying properly? Yes a. Are there any unexpected incontinence issues? No b. If applicable, is patient following bowel/bladder programs? NA 6. Any fevers, problems with breathing, unexpected pain? No 7. Are there any skin problems or new areas of breakdown? No 8. Has the patient/family member arranged specialty MD follow up (ie cardiology/neurology/renal/surgical/etc.)?  All follow up appointments are scheduled a. Can we help arrange? NA 9. Does the patient need any other services or support that we can help arrange? No 10. Are caregivers following through as expected in assisting the patient? Yes 11. Has the patient quit smoking, drinking alcohol, or using drugs as recommended? Ms. Brothers states her husband Mr. Cordell doesn't smoke, drink alcohol or use illicit drugs.   Appointment date/time 09/25/2017, arrival time 1:15 for 1:45 appointment with Dr. Letta Pate. At [place]

## 2017-09-14 NOTE — Telephone Encounter (Signed)
Wife called back

## 2017-09-14 NOTE — Telephone Encounter (Signed)
[place] with me. Please place any necessary orders.  [name], MD 09/14/2017 8:11 AM

## 2017-09-15 ENCOUNTER — Ambulatory Visit (INDEPENDENT_AMBULATORY_CARE_PROVIDER_SITE_OTHER): Payer: Medicare Other | Admitting: Endocrinology

## 2017-09-15 ENCOUNTER — Encounter: Payer: Self-pay | Admitting: Endocrinology

## 2017-09-15 VITALS — BP 132/64 | HR 74 | Ht 76.0 in | Wt 272.0 lb

## 2017-09-15 DIAGNOSIS — I639 Cerebral infarction, unspecified: Secondary | ICD-10-CM

## 2017-09-15 DIAGNOSIS — Z794 Long term (current) use of insulin: Secondary | ICD-10-CM

## 2017-09-15 DIAGNOSIS — E1165 Type 2 diabetes mellitus with hyperglycemia: Secondary | ICD-10-CM

## 2017-09-15 NOTE — Progress Notes (Signed)
Patient ID: David [name], male   DOB: 1941-02-25, 77 y.o.   MRN: [id number]    Reason for Appointment: Followup for Type 2 Diabetes  History of Present Illness:          Diagnosis: Type 2 diabetes mellitus, date of diagnosis:   1992       Past history:  He was initially treated with metformin and at some point also glipizide. His previous records from out of town are not available and no information is available about his level of control Apparently he was started on insulin in 1994 approximately because of poor control He has been on various insulin regimens over the last several years However even with insulin he has had poor control for at least the last 7 or 8 years. He does not know what his previous A1c levels have been. He had been continued on metformin and glipizide but metformin stopped about 2 years ago because of kidney function abnormality He had been taking Lantus 60 units twice a day with NovoLog previously and also Before his initial consultation in 6/15 he was on NovoLog twice a day and Humalog mix insulin  Because of poor control and large insulin doses he was started on Victoza in 6/15  Recent history:   INSULIN DOSES: LANTUS 18 UNITS TWICE DAILY, NOVOLOG 5 UNITS 3 TIMES DAILY WITH SLIDING SCALE  PREVIOUS PUMP  regimen with U-500 insulin is as follows BASAL rates:Midnight = 0.8 ,6 AM = 1.1, 12 PM = 0.9 and 9 PM = 0.7   I/C ratio: 20, ISF: 45, target 120-150.  Since 04/28/14 he has been on a Medtronic insulin pump because of persistent poor control and high insulin requirement  Has had A1c's of mostly over 8% but last month it is down to 7.6  Current management, problems and blood sugar patterns:  He was seen by the dietitian about a month ago apparently he did make changes in his diet  He apparently was doing a lot of snacks with higher fat foods previously  Since he has had a stroke about 3 weeks ago he has not been able to use his pump because  of cognitive defects his family is not able to help him with this  However his insulin requirement appears to be markedly reduced with taking only about 50 units of insulin total a day  FASTING readings are relatively high except today was 145 but he had a low fat meal at supper and less carbohydrate at bedtime  Glucose is consistently around 200+ at lunchtime but lower at suppertime  His Victoza was stopped in the hospital       Oral hypoglycemic drugs the patient is taking are: None, has renal dysfunction        Glucose monitoring:  done about 3 times a day         Glucometer:  contour     Blood Glucose readings from review home record  Mean values apply above for all meters except median for One Touch  PRE-MEAL Fasting Lunch Dinner Bedtime Overall  Glucose range:  145-220  200-225  165, 153  unknown   Mean/median:        Previous readings:  Mean values apply above for all meters except median for One Touch  PRE-MEAL Fasting Lunch Dinner Bedtime Overall  Glucose range:  183-322   92-297    Mean/median:   189  187  185   POST-MEAL PC Breakfast PC Lunch PC  Dinner  Glucose range:     Mean/median:  144        Self-care:   Meals: 2- 3 meals per day.   breakfast is cheerios, egg a toast.  Will have half sandwich with soup at 1 pm , dinner at  6-7 pm.    He thinks his portions are small but periodically eats sweets.   Bedtime snack is usually crackers  with milk or fruit         Exercise:  Walking some   Last  consultation : Most recent: 08/2017      Wt Readings from Last 3 Encounters:  09/15/17 272 lb (123.4 kg)  09/12/17 273 lb 5.9 oz (124 kg)  08/30/17 287 lb (130.2 kg)   Glycemic control:   Lab Results  Component Value Date   HGBA1C 7.6 (H) 08/31/2017   HGBA1C 8.1 (H) 05/23/2017   HGBA1C 7.8 (H) 03/05/2017   Lab Results  Component Value Date   MICROALBUR 7.7 (H) 10/07/2016   LDLCALC 106 (H) 08/31/2017   CREATININE 2.76 (H) 09/11/2017            Lab  Results  Component Value Date   FRUCTOSAMINE 315 (H) 07/21/2017   FRUCTOSAMINE 328 (H) 08/05/2016   FRUCTOSAMINE 326 (H) 01/30/2014     Other active problems: See review of systems   Allergies as of 09/15/2017      Reactions   Adhesive [tape] Other (See Comments)   blisters      Medication List        Accurate as of 09/15/17 11:17 AM. Always use your most recent med list.          acetaminophen 325 MG tablet Commonly known as:  TYLENOL Take 1-2 tablets (325-650 mg total) by mouth every 4 (four) hours as needed for mild pain.   aspirin 325 MG tablet Take 1 tablet (325 mg total) by mouth daily.   atorvastatin 10 MG tablet Commonly known as:  LIPITOR Take 1 tablet (10 mg total) by mouth daily at 6 PM.   calcitRIOL 0.25 MCG capsule Commonly known as:  ROCALTROL Take 0.25 mcg by mouth daily.   carvedilol 25 MG tablet Commonly known as:  COREG Take 1 tablet (25 mg total) by mouth 2 (two) times daily with a meal.   cholecalciferol 1000 units tablet Commonly known as:  VITAMIN D Take 1 tablet (1,000 Units total) by mouth daily.   clopidogrel 75 MG tablet Commonly known as:  PLAVIX Take 1 tablet (75 mg total) by mouth daily.   diclofenac sodium 1 % Gel Commonly known as:  VOLTAREN Apply 2 g topically 3 (three) times daily.   hydrOXYzine 50 MG tablet Commonly known as:  ATARAX/VISTARIL Take 1 tablet (50 mg total) 3 (three) times daily as needed by mouth.   insulin aspart cartridge Commonly known as:  NOVOLOG PENFILL Inject 5 Units into the skin 3 (three) times daily with meals.   Insulin Glargine 100 UNIT/ML Solostar Pen Commonly known as:  LANTUS Inject 18 Units into the skin 2 (two) times daily.   isosorbide mononitrate 30 MG 24 hr tablet Commonly known as:  IMDUR Take 1 tablet (30 mg total) by mouth daily.   pantoprazole 40 MG tablet Commonly known as:  PROTONIX TAKE ONE TABLET BY MOUTH ONCE DAILY   Pen Needles 32G X 5 MM Misc 1 application by Does not  apply route 4 (four) times daily.   sertraline 100 MG tablet Commonly known as:  ZOLOFT Take 100 mg by mouth daily.   torsemide 10 MG tablet Commonly known as:  DEMADEX Take 1 tablet (10 mg total) by mouth 2 (two) times daily.   vitamin B-12 100 MCG tablet Commonly known as:  CYANOCOBALAMIN Take 100 mcg by mouth daily.       Allergies:  Allergies  Allergen Reactions  . Adhesive [Tape] Other (See Comments)    blisters    Past Medical History:  Diagnosis Date  . Anemia, iron deficiency   . Anxiety   . Arthritis   . BPH (benign prostatic hypertrophy)   . CAD (coronary artery disease)    Nonobstructive CAD per cath  . Cardiac pacemaker in situ   . CHF (congestive heart failure) ([place])   . Chronic ulcer of right foot ([place])   . CKD (chronic kidney disease), stage III ([place]) secondary to DM and HTN   nephrologist-  [name]  . Dyspnea   . History of cellulitis    right great toe 10-25-2014  . History of skin cancer   . HOCM (hypertrophic obstructive cardiomyopathy) ([place])   . Hypertension   . Insulin dependent type 2 diabetes mellitus ([place]) 1991   followd by [name]--  has insulin pump  . Insulin pump in place   . OSA on CPAP   . Peripheral neuropathy    severe  . Peripheral vascular disease ([place])    bilateral lower extremities  . Rib fracture 07/24/2015  . Secondary hyperparathyroidism of renal origin ([place])   . Sinus node dysfunction ([facility])   . Sleep apnea     Past Surgical History:  Procedure Laterality Date  . AMPUTATION OF REPLICATED TOES  Mar 6789   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)  . AMPUTATION TOE Right 03/12/2015   Procedure: RIGHT HALLUS AMPUTATION ;  Surgeon: [name], MD;  Location: [facility];  Service: Podiatry;  Laterality: Right;  . CARDIAC CATHETERIZATION  11-25-2010   [place], [place]   Nonobstructive CAD  . CARDIAC PACEMAKER PLACEMENT  Nov 2009   Medtronic  . CHOLECYSTECTOMY N/A 03/05/2017   Procedure: LAPAROSCOPIC  CHOLECYSTECTOMY WITH INTRAOPERATIVE CHOLANGIOGRAM;  Surgeon: [name], MD;  Location: [facility];  Service: General;  Laterality: N/A;  . EP IMPLANTABLE DEVICE N/A 06/03/2015   Procedure: PPM Generator Changeout;  Surgeon: [name], MD;  Location: [facility];  Service: Cardiovascular;  Laterality: N/A;  . EXCISION BONE CYST Right 03/06/2015   Procedure: BONE BIOPSIES OF RIGHT FOOT;  Surgeon: [name], MD;  Location: [facility];  Service: Podiatry;  Laterality: Right;  . ORIF ANKLE FRACTURE Left 11/06/2014   Procedure: OPEN REDUCTION INTERNAL FIXATION (ORIF) LEFT  ANKLE FRACTURE;  Surgeon: [name], MD;  Location: [facility];  Service: Orthopedics;  Laterality: Left;  . TEE WITHOUT CARDIOVERSION N/A 09/01/2017   Procedure: TRANSESOPHAGEAL ECHOCARDIOGRAM (TEE);  Surgeon: [name], MD;  Location: [facility];  Service: Cardiovascular;  Laterality: N/A;  . TOTAL KNEE ARTHROPLASTY    . VEIN LIGATION AND STRIPPING      Family History  Problem Relation Age of Onset  . Cancer Mother        breast  . Heart attack Father     Social History:  reports that he quit smoking about 33 years ago. He has a 60.00 pack-year smoking history. He has quit using smokeless tobacco. He reports that he drinks about 0.6 oz of alcohol per week. He reports that he does not use drugs.  Review of Systems   Following is a copy of previous note      Lipids: Had been on a Lipitor since late 2014. Also had taken Gembrozil for several years without apparent side effects.  Both of these were  stopped because of increased liver functions and LFT has been back to normal  HDL is consistently low LDL is below 100   He is taking  fish oil OTC  but triglycerides are high, was prescribed Vascepa but triglycerides are still over 200   Lab Results  Component Value Date   CHOL 165 08/31/2017   CHOL 175 07/21/2017   CHOL 168 05/23/2017   Lab Results  Component Value  Date   HDL 20 (L) 08/31/2017   HDL 25.10 (L) 07/21/2017   HDL 22.20 (L) 05/23/2017   Lab Results  Component Value Date   LDLCALC 106 (H) 08/31/2017   LDLCALC 94 02/17/2017   LDLCALC 49 10/29/2013   Lab Results  Component Value Date   TRIG 195 (H) 08/31/2017   TRIG 259.0 (H) 07/21/2017   TRIG 282.0 (H) 05/23/2017   Lab Results  Component Value Date   CHOLHDL 8.3 08/31/2017   CHOLHDL 7 07/21/2017   CHOLHDL 8 05/23/2017   Lab Results  Component Value Date   LDLDIRECT 76.0 07/21/2017   LDLDIRECT 89.0 05/23/2017   LDLDIRECT 77.0 08/05/2016      HYPERTENSION: The blood pressure has been treated with various drugs by his other physicians. Cardura was added when blood pressure was higher previously and he continues to take this He thinks his blood pressure was fluctuating at home recently  BP Readings from Last 3 Encounters:  09/15/17 132/64  09/12/17 (!) 164/58  09/04/17 131/74     Chronic kidney disease: Etiology unknown, is followed by nephrologist  Last creatinine:  Lab Results  Component Value Date   CREATININE 2.76 (H) 09/11/2017        Has history of Numbness in his feet  since about 2013  He has been wearing diabetic shoes.  He has been followed by podiatrist He is  under the care of a podiatrist for Regular care     Physical Examination:  BP 132/64 (BP Location: Left Arm, Patient Position: Sitting, Cuff Size: Normal)   Pulse 74   Ht 6\' 4"  (1.93 m)   Wt 272 lb (123.4 kg)   SpO2 97%   BMI 33.11 kg/m     ASSESSMENT:  Diabetes type 2, uncontrolled with obesity See history of present illness for detailed discussion of current diabetes management, blood sugar patterns and problems identified   His last A1c was 7.6  As discussed above he is more than the insulin pump because of not being able to use it on his own now Possibly because of his markedly improved diet he is requiring much less insulin including mealtime correct However currently not  taking Victoza since it was stopped in the hospital  PLAN:   He will increase his Lantus to 20 units twice a day since his fasting readings are consistently high Increase NovoLog to 7 units at breakfast Also will simplify his sliding scale and add only correction doses for blood sugars over 150 between 1-3 units Continue excellent diet If blood sugars start going up or if he is having excessive hunger he will go back to the [place] His wife was present and explained his treatment regimen to her   Patient Instructions  Lantus 20 units twice daily  Novolog 7 in am  and extra 2 for cereal  Use 5 units at lunch and supper  Sliding scale  150-199 add 1 unit Novolog, 200-249 add 2 units and over 250: 3 units   Elayne Snare 09/15/2017, 11:17 AM   Note: This office note was prepared with Dragon voice recognition system technology. Any transcriptional errors that result from this process are unintentional.

## 2017-09-15 NOTE — Patient Instructions (Signed)
Lantus 20 units twice daily  Novolog 7 in am and extra 2 for cereal  Use 5 units at lunch and supper  Sliding scale  150-199 add 1 unit Novolog, 200-249 add 2 units and over 250: 3 units

## 2017-09-18 ENCOUNTER — Other Ambulatory Visit: Payer: Self-pay | Admitting: Family Medicine

## 2017-09-18 ENCOUNTER — Encounter: Payer: Self-pay | Admitting: Sports Medicine

## 2017-09-18 ENCOUNTER — Ambulatory Visit: Payer: Medicare Other | Attending: Physical Medicine & Rehabilitation | Admitting: Physical Therapy

## 2017-09-18 ENCOUNTER — Encounter: Payer: Self-pay | Admitting: Physical Therapy

## 2017-09-18 ENCOUNTER — Ambulatory Visit: Payer: Medicare Other | Admitting: Occupational Therapy

## 2017-09-18 ENCOUNTER — Other Ambulatory Visit: Payer: Self-pay

## 2017-09-18 VITALS — BP 117/45 | HR 69

## 2017-09-18 DIAGNOSIS — R29818 Other symptoms and signs involving the nervous system: Secondary | ICD-10-CM | POA: Diagnosis not present

## 2017-09-18 DIAGNOSIS — R293 Abnormal posture: Secondary | ICD-10-CM | POA: Diagnosis not present

## 2017-09-18 DIAGNOSIS — R41841 Cognitive communication deficit: Secondary | ICD-10-CM | POA: Insufficient documentation

## 2017-09-18 DIAGNOSIS — R2689 Other abnormalities of gait and mobility: Secondary | ICD-10-CM | POA: Insufficient documentation

## 2017-09-18 DIAGNOSIS — M6281 Muscle weakness (generalized): Secondary | ICD-10-CM | POA: Diagnosis not present

## 2017-09-18 DIAGNOSIS — I69315 Cognitive social or emotional deficit following cerebral infarction: Secondary | ICD-10-CM | POA: Diagnosis not present

## 2017-09-18 NOTE — Telephone Encounter (Signed)
Copied from [place] [phone]. Topic: Quick Communication - Rx Refill/Question >> Sep 18, 2017 11:47 AM Synthia Innocent wrote: Medication: clopidogrel (PLAVIX) 75 MG tablet  Has the patient contacted their pharmacy? Yes.   (Agent: If no, request that the patient contact the pharmacy for the refill.) Preferred Pharmacy (with phone number or street name): [facility] on [address], did not receive refill on 08/3017 Agent: Please be advised that RX refills may take up to 3 business days. We ask that you follow-up with your pharmacy.

## 2017-09-18 NOTE — Therapy (Signed)
[facility] [address] [place], [name], [address] Phone: [phone]   Fax:  [phone]  Physical Therapy Evaluation  Patient Details  Name: David [name] MRN: [id number] Date of Birth: 1940-07-20 Referring Provider: Dr. Naaman Plummer   Encounter Date: 09/18/2017  PT End of Session - 09/18/17 1914    Visit Number  1    Number of Visits  17    Date for PT Re-Evaluation  12/17/17    Authorization Type  Medicare; [employer] Time Period  09/18/17 to 12/17/17    PT Start Time  1327 late arrival    PT Stop Time  1400    PT Time Calculation (min)  33 min    Equipment Utilized During Treatment  Gait belt    Activity Tolerance  Patient tolerated treatment well    Behavior During Therapy  [facility] for tasks assessed/performed       Past Medical History:  Diagnosis Date  . Anemia, iron deficiency   . Anxiety   . Arthritis   . BPH (benign prostatic hypertrophy)   . CAD (coronary artery disease)    Nonobstructive CAD per cath  . Cardiac pacemaker in situ   . CHF (congestive heart failure) ([place])   . Chronic ulcer of right foot ([place])   . CKD (chronic kidney disease), stage III ([place]) secondary to DM and HTN   nephrologist-  [name]  . Dyspnea   . History of cellulitis    right great toe 10-25-2014  . History of skin cancer   . HOCM (hypertrophic obstructive cardiomyopathy) ([place])   . Hypertension   . Insulin dependent type 2 diabetes mellitus ([place]) 1991   followd by [name]--  has insulin pump  . Insulin pump in place   . OSA on CPAP   . Peripheral neuropathy    severe  . Peripheral vascular disease ([place])    bilateral lower extremities  . Rib fracture 07/24/2015  . Secondary hyperparathyroidism of renal origin ([place])   . Sinus node dysfunction ([facility])   . Sleep apnea     Past Surgical History:  Procedure Laterality Date  . AMPUTATION OF REPLICATED TOES  Mar 4332   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)   . AMPUTATION TOE Right 03/12/2015   Procedure: RIGHT HALLUS AMPUTATION ;  Surgeon: [name], MD;  Location: [facility];  Service: Podiatry;  Laterality: Right;  . CARDIAC CATHETERIZATION  11-25-2010   [place], [place]   Nonobstructive CAD  . CARDIAC PACEMAKER PLACEMENT  Nov 2009   [employer]  . CHOLECYSTECTOMY N/A 03/05/2017   Procedure: LAPAROSCOPIC CHOLECYSTECTOMY WITH INTRAOPERATIVE CHOLANGIOGRAM;  Surgeon: [name], MD;  Location: [facility];  Service: General;  Laterality: N/A;  . EP IMPLANTABLE DEVICE N/A 06/03/2015   Procedure: PPM Generator Changeout;  Surgeon: [name], MD;  Location: [facility];  Service: Cardiovascular;  Laterality: N/A;  . EXCISION BONE CYST Right 03/06/2015   Procedure: BONE BIOPSIES OF RIGHT FOOT;  Surgeon: [name], MD;  Location: [facility];  Service: Podiatry;  Laterality: Right;  . ORIF ANKLE FRACTURE Left 11/06/2014   Procedure: OPEN REDUCTION INTERNAL FIXATION (ORIF) LEFT  ANKLE FRACTURE;  Surgeon: [name], MD;  Location: [facility];  Service: Orthopedics;  Laterality: Left;  . TEE WITHOUT CARDIOVERSION N/A 09/01/2017   Procedure: TRANSESOPHAGEAL ECHOCARDIOGRAM (TEE);  Surgeon: [name], MD;  Location: [facility];  Service: Cardiovascular;  Laterality: N/A;  .  TOTAL KNEE ARTHROPLASTY    . VEIN LIGATION AND STRIPPING      Vitals:   09/18/17 1331  BP: (!) 117/45  Pulse: 69     Subjective Assessment - 09/18/17 1331    Subjective  I need to work on my leg muscles and need to work on my walking. I had been negligent due to lt ankle fx, rt toe amputations. I was using a cane before the stroke.     Patient is accompained by:  Family member wife    Pertinent History  HTN, morbid obesity, CKD, cardiomyopathy, PPM, macular degeneration, CAD, cardiac arrest in hospital, DM, diabetic neuropathy; PVD, Rt 1-2 toe amputations, partial rt 3rd toe amputation, lt ankle ORIF 6/16     Limitations  Walking    How long can you walk comfortably?  about 10 minutes    Patient Stated Goals  get back to using my cane; feeding the birds    Currently in Pain?  No/denies         [facility] PT Assessment - 09/18/17 1336      Assessment   Medical Diagnosis  CVA RMCA    Referring Provider   Alger Simons, MD    Onset Date/Surgical Date  08/31/17    Prior Therapy  CiR straight to OPPT      Precautions   Precautions  Fall      Restrictions   Weight Bearing Restrictions  No      Balance Screen   Has the patient fallen in the past 6 months  Yes    How many times?  1 fell going into bedroom; tripped over end table; broke ribs    Has the patient had a decrease in activity level because of a fear of falling?   Yes    Is the patient reluctant to leave their home because of a fear of falling?   No      Home Environment   Living Environment  Private residence    Available Help at Discharge  Family;Available 24 hours/day;Friend(s) son, dtr-in-law, neighbor    Type of Home  Other(Comment) townhouse    Home Access  Stairs to enter    Entrance Stairs-Number of Steps  1    Entrance Stairs-Rails  Right    Home Layout  One level    [place] - 2 wheels;Walker - 4 wheels;Tub bench;Grab bars - toilet;Cane - single point;Wheelchair - manual SPC with triangle tip;       Prior Function   Level of Independence  Independent including driving    Vocation  Retired    Leisure  birdfeeding      Cognition   Overall Cognitive Status  Impaired/Different from baseline    Memory  Impaired has begun using Lumosity since d/c      Observation/Other Assessments   Skin Integrity  denies open wounds    Focus on Therapeutic Outcomes (FOTO)   not captured due to pt's late arrival      Observation/Other Assessments-Edema    Edema  -- + bil LEs      Sensation   Light Touch  Impaired by gross assessment    Additional Comments  bil neuropathy up to ankles      Posture/Postural Control    Posture/Postural Control  Postural limitations    Postural Limitations  Rounded Shoulders;Forward head;Decreased lumbar lordosis      ROM / Strength   AROM / PROM / Strength  AROM;Strength  AROM   Overall AROM   Within functional limits for tasks performed bil LEs      Strength   Overall Strength  Deficits    Right Hip Flexion  5/5    Right Hip ABduction  4+/5    Left Hip Flexion  4+/5    Left Hip ABduction  4/5    Right Knee Flexion  5/5    Right Knee Extension  5/5    Left Knee Flexion  4/5    Left Knee Extension  4+/5    Right Ankle Dorsiflexion  5/5    Left Ankle Dorsiflexion  5/5      Transfers   Transfers  Sit to Stand;Stand to Sit    Sit to Stand  6: Modified independent (Device/Increase time)    Stand to Sit  6: Modified independent (Device/Increase time)    Comments  proper sequencing with RW      Ambulation/Gait   Ambulation Distance (Feet)  75 Feet 75, 75    Assistive device  Rolling walker;Straight cane SPC with triangle base    Gait Pattern  Step-through pattern;Decreased step length - right;Decreased stance time - left;Wide base of support    Ambulation Surface  Level;Indoor    Gait velocity  32.8 ft/16.71 sec= 1.96 ft/sec with RW; 2.03 ft/sec with [facility]    Gait Comments  Patient reports he had difficulty using 2 wheel walker outside on his sidewalk. Has purchased rollator since d/c from CIR and uses it when walking in neighborhood for exercise.       Static Standing Balance   Static Standing - Balance Support  No upper extremity supported    Static Standing - Level of Assistance  4: Min assist    Static Standing Balance -  Activities   [place]berg - Eyes Opened    Static Standing - Comment/# of Minutes  minguard due to incr sway with romberg EO and with feet shoulder width and EC due to incr sway                Objective measurements completed on examination: See above findings.              PT Education - 09/18/17 1913    Education  provided  Yes    Education Details  PT POC (anticipate <8 weeks and can cancel appts if not needed)    Person(s) Educated  Patient;Spouse    Methods  Explanation    Comprehension  Verbalized understanding       PT Short Term Goals - 09/18/17 1934      PT SHORT TERM GOAL #1   Title  Patient will complete HEP with supervision of family to assure proper technique. (Target for all STGs 11/01/2017--date reflects 4 weeks after 1st treatment 10/03/17)    Time  4    Period  Weeks    Status  New    Target Date  11/01/17      PT SHORT TERM GOAL #2   Title  Patient will improve TUG normal to <13.5 seconds with SPC to demonstrate lesser fall risk.     Time  4    Period  Weeks    Status  New      PT SHORT TERM GOAL #3   Title  Patient will improve Berg score >=44/56 demonstrating adequate balance for use of cane indoors.     Time  4    Period  Weeks    Status  New  PT SHORT TERM GOAL #4   Title  Patient can verbalize signs/symptoms of CVA, appropriate actions to take, and understanding of need to get to hospital within 3 hours of onset of symptoms    Time  4    Period  Weeks        PT Long Term Goals - 09/18/17 1946      PT LONG TERM GOAL #1   Title  Patient will be independent with HEP (using handouts) for balance and strengthening. (Target for all LTG's 12/02/2017--based on late start date)    Time  8    Period  Weeks    Status  New    Target Date  12/02/17      PT LONG TERM GOAL #2   Title  Patient will improve gait velocity to >=2.62 ft/sec with least restrictive assistive device (indicative of velocity for safe community ambulation)    Time  8    Period  Weeks    Status  New      PT LONG TERM GOAL #3   Title  Patient will improve Berg to >=47/56 indicative of safe use of cane outdoors.     Time  8    Period  Weeks    Status  New      PT LONG TERM GOAL #4   Title  Patient able to ambulate >= 200 ft outdoors with cane modified independent over unlevel ground to allow  safe walking to his bird feeders in his back yard.     Time  8    Period  Weeks    Status  New             Plan - 09/18/17 1917    Clinical Impression Statement  Patient referred for OPPT s/p rt MCA CVA with residual lt weakness and the deficits listed below. Patient's balance is further complicated by rt toe amputations (1, 2, part of 3) and does not have a shoe/toe filler. Patient currently requires rollator when walking outside and is unable to walk in his back yard to fill his bird feeders. He wants to get back to using a cane to allow him increased independence overall (as he will have one free hand to open doors, carry items room to room, etc). Patient can benefit from PT to achieve the LTGs outlined via the interventions listed below.     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, lt ankle ORIF 6/16    Clinical Presentation  Evolving    Clinical Presentation due to:  acute stroke <30 days prior; multiple co-morbidities also impacting his function and independence now that he is using a RW    Clinical Decision Making  Moderate    Rehab Potential  Good    Clinical Impairments Affecting Rehab Potential  - Multiple co-morbidities; + very motivated    PT Frequency  2x / week    PT Duration  8 weeks    PT Treatment/Interventions  ADLs/Self Care Home Management;Aquatic Therapy;Electrical Stimulation;Gait training;DME Instruction;Stair training;Functional mobility training;Therapeutic activities;Therapeutic exercise;Balance training;Neuromuscular re-education;Cognitive remediation;Manual techniques;Orthotic Fit/Training;Patient/family education;Passive range of motion    PT Next Visit Plan  Assess TUG normal and cognitive with goals updated if needed; assess Berg (28/56 on d/c from CIR);  initiate HEP for balance (at counter); review s/s of CVA and actions to  take, timeframe to get to hospital    PT Home Exercise Plan  reports no HEP given on CIR, wife confirms    Consulted and Agree with Plan of Care  Patient;Family member/caregiver    Family Member Consulted  wife       Patient will benefit from skilled therapeutic intervention in order to improve the following deficits and impairments:  Abnormal gait, Decreased activity tolerance, Decreased balance, Decreased cognition, Decreased mobility, Decreased knowledge of use of DME, Decreased strength, Increased edema, Impaired sensation, Postural dysfunction, Impaired UE functional use, Obesity  Visit Diagnosis: Abnormal posture - Plan: PT plan of care cert/re-cert  Other abnormalities of gait and mobility - Plan: PT plan of care cert/re-cert  Other symptoms and signs involving the nervous system - Plan: PT plan of care cert/re-cert  Muscle weakness (generalized) - Plan: PT plan of care cert/re-cert     Problem List Patient Active Problem List   Diagnosis Date Noted  . Ischemic stroke of frontal lobe ([place]) 09/04/2017  . Agitation   . Restless   . Diastolic dysfunction   . Coronary artery disease involving native coronary artery of native heart without angina pectoris   . Cardiac pacemaker in situ   . Stage 3 chronic kidney disease ([place])   . Atrial fibrillation with rapid ventricular response ([place])   . Acute blood loss anemia   . PVD (peripheral vascular disease) ([place])   . Tachypnea   . Acute ischemic stroke ([place]) 08/31/2017  . Lower extremity edema 04/21/2017  . Anxiety 03/22/2017  . Acute cholecystitis s/p lap cholecystectomy 03/05/2017 03/04/2017  . HOCM (hypertrophic obstructive cardiomyopathy) ([place]) 03/04/2017  . 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 Fatigue 01/27/2017  . Low vitamin B12 level 01/27/2017  . Chronic ulcer of right foot ([place]) 08/09/2016  . Syncope  07/24/2015  . 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 OSA on CPAP 10/26/2014  . Back pain 10/16/2014  . Left leg numbness 10/16/2014  . Episodic lightheadedness 08/04/2014  . Open toe wound 07/18/2014  . Diabetes mellitus with neurological manifestations, uncontrolled ([place]) 02/27/2014  . Seasonal and perennial allergic rhinitis 10/25/2013  . Hypertension associated with diabetes ([place]) 10/25/2013  . Hyperlipidemia associated with type 2 diabetes mellitus ([place]) 10/25/2013  . Morbid obesity ([place]) 10/25/2013  . CKD stage 3 due to type 2 diabetes mellitus ([place]) 10/25/2013    Rexanne Mano, PT 09/18/2017, 8:07 PM  [facility] [address] [place], [name], [address] Phone: [phone]   Fax:  [phone]  Name: David [name] MRN: [id number] Date of Birth: 1941-02-25

## 2017-09-19 ENCOUNTER — Other Ambulatory Visit: Payer: Medicare Other

## 2017-09-19 MED ORDER — CLOPIDOGREL BISULFATE 75 MG PO TABS: 75.0000 mg | ORAL_TABLET | Freq: Every day | ORAL | 0 refills | Status: DC

## 2017-09-19 NOTE — Telephone Encounter (Signed)
OK to refill or needs appt? See note

## 2017-09-19 NOTE — Telephone Encounter (Signed)
Left voicemail requesting call back.  

## 2017-09-19 NOTE — Telephone Encounter (Signed)
See note

## 2017-09-19 NOTE — Telephone Encounter (Signed)
Plavix 75 mg refill request  Dr. Marigene Ehlers pt.    Been in hospital twice for strokes with no recent office visits.  [place], [place] [address]

## 2017-09-19 NOTE — Therapy (Signed)
[facility] [address] [place], [name], [address] Phone: [phone]   Fax:  [phone]  Occupational Therapy Evaluation  Patient Details  Name: David [name] MRN: [id number] Date of Birth: 1941-02-19 Referring Provider: Dr. Naaman Plummer   Encounter Date: 09/18/2017  OT End of Session - 09/19/17 1125    Visit Number  1    Number of Visits  17    Date for OT Re-Evaluation  11/18/17    Authorization Type  Medicare/ [employer]    OT Start Time  1405    OT Stop Time  1445    OT Time Calculation (min)  40 min    Activity Tolerance  Patient tolerated treatment well    Behavior During Therapy  [facility] for tasks assessed/performed       Past Medical History:  Diagnosis Date  . Anemia, iron deficiency   . Anxiety   . Arthritis   . BPH (benign prostatic hypertrophy)   . CAD (coronary artery disease)    Nonobstructive CAD per cath  . Cardiac pacemaker in situ   . CHF (congestive heart failure) ([place])   . Chronic ulcer of right foot ([place])   . CKD (chronic kidney disease), stage III ([place]) secondary to DM and HTN   nephrologist-  [name]  . Dyspnea   . History of cellulitis    right great toe 10-25-2014  . History of skin cancer   . HOCM (hypertrophic obstructive cardiomyopathy) ([place])   . Hypertension   . Insulin dependent type 2 diabetes mellitus ([place]) 1991   followd by [name]--  has insulin pump  . Insulin pump in place   . OSA on CPAP   . Peripheral neuropathy    severe  . Peripheral vascular disease ([place])    bilateral lower extremities  . Rib fracture 07/24/2015  . Secondary hyperparathyroidism of renal origin ([place])   . Sinus node dysfunction ([facility])   . Sleep apnea     Past Surgical History:  Procedure Laterality Date  . AMPUTATION OF REPLICATED TOES  Mar 9833   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)  . AMPUTATION TOE Right 03/12/2015   Procedure: RIGHT HALLUS AMPUTATION ;  Surgeon: [name], MD;  Location:  [facility];  Service: Podiatry;  Laterality: Right;  . CARDIAC CATHETERIZATION  11-25-2010   [place], [place]   Nonobstructive CAD  . CARDIAC PACEMAKER PLACEMENT  Nov 2009   [employer]  . CHOLECYSTECTOMY N/A 03/05/2017   Procedure: LAPAROSCOPIC CHOLECYSTECTOMY WITH INTRAOPERATIVE CHOLANGIOGRAM;  Surgeon: [name], MD;  Location: [facility];  Service: General;  Laterality: N/A;  . EP IMPLANTABLE DEVICE N/A 06/03/2015   Procedure: PPM Generator Changeout;  Surgeon: [name], MD;  Location: [facility];  Service: Cardiovascular;  Laterality: N/A;  . EXCISION BONE CYST Right 03/06/2015   Procedure: BONE BIOPSIES OF RIGHT FOOT;  Surgeon: [name], MD;  Location: [facility];  Service: Podiatry;  Laterality: Right;  . ORIF ANKLE FRACTURE Left 11/06/2014   Procedure: OPEN REDUCTION INTERNAL FIXATION (ORIF) LEFT  ANKLE FRACTURE;  Surgeon: [name], MD;  Location: [facility];  Service: Orthopedics;  Laterality: Left;  . TEE WITHOUT CARDIOVERSION N/A 09/01/2017   Procedure: TRANSESOPHAGEAL ECHOCARDIOGRAM (TEE);  Surgeon: [name], MD;  Location: [facility];  Service: Cardiovascular;  Laterality: N/A;  . TOTAL KNEE ARTHROPLASTY    . VEIN LIGATION AND STRIPPING      There were no vitals filed  for this visit.  Subjective Assessment - 09/19/17 1138    Subjective   Pt reports he wants to get back to walking with his cane    Pertinent History  .PMH:CVA, history of type 2 diabetes mellitus with neuropathy, CAD, cardiac arrest in hospital, R toe amputations, CKD stage III, age-related macular degeneration    Patient Stated Goals  to get back to prior functional level    Currently in Pain?  No/denies        [facility] OT Assessment - 09/18/17 1412      Assessment   Medical Diagnosis  CVA RMCA    Referring Provider  Dr. Naaman Plummer    Onset Date/Surgical Date  08/31/17    Prior Therapy  CiR straight to OPPT      Precautions   Precautions   Fall      Restrictions   Weight Bearing Restrictions  No      Balance Screen   Has the patient fallen in the past 6 months  No    Has the patient had a decrease in activity level because of a fear of falling?   No      Home  Environment   Family/patient expects to be discharged to:  -- townhouse    Alternate Level Stairs - Number of Steps  1    Bathroom Shower/Tub  Tub/Shower unit tub bench, grab bars    Lives With  Spouse      Prior Function   Level of Independence  Independent    Vocation  Retired    Leisure  birdfeeding      ADL   Eating/Feeding  Independent    Grooming  Modified independent    Comptroller  Supervision/safety      IADL   Shopping  Needs to be accompanied on any shopping trip    Light Housekeeping  Performs light daily tasks such as dishwashing, bed making    Meal Prep  Able to complete simple cold meal and snack prep    Medication Management  Takes responsibility if medication is prepared in advance in seperate dosage    Financial Management  -- Pt's wife handles       Mobility   Mobility Status  -- supervision with AD      Written Expression   Dominant Hand  Right    Handwriting  100% legible      Vision - History   Baseline Vision  Wears glasses all the time    Visual History  Macular degeneration      Vision Assessment   Vision Assessment  Vision not tested      Activity Tolerance   Activity Tolerance  -- decreased overall activity tolerance      Cognition   Overall Cognitive Status  Impaired/Different from baseline to be further assessed in a functional context prn.    Attention  Selective Pt is distracted in a busy environment    Memory  Impaired    Memory Impairment  Decreased short term memory    Awareness   Impaired    Problem Solving  --      Observation/Other Assessments   Focus on Therapeutic Outcomes (FOTO)   not captured    Standing Functional Reach Test  RUE 12 inches, LUE 13 inches      Posture/Postural Control   Posture/Postural Control  Postural limitations    Postural Limitations  Rounded Shoulders;Forward head;Decreased lumbar lordosis      Sensation   Light Touch  Appears Intact intact for UE's per pt, impaired for LE's    Additional Comments  bil neuropathy up to ankles      Coordination   9 Hole Peg Test  Right;Left    Right 9 Hole Peg Test  29.47 secs    Left 9 Hole Peg Test  28.96 secs      ROM / Strength   AROM / PROM / Strength  AROM;Strength      AROM   Overall AROM   Within functional limits for tasks performed      Strength   Overall Strength  Deficits    Overall Strength Comments  RUE is grossly 5/5, LUE grossly 4+/5      Hand Function   Right Hand Grip (lbs)  75 lbs    Left Hand Grip (lbs)  70 lbs                        OT Short Term Goals - 09/19/17 1140      OT SHORT TERM GOAL #1   Title  I with HEP for UE strength    Time  4    Period  Weeks    Status  New    Target Date  10/19/17      OT SHORT TERM GOAL #2   Title  Pt will verbalize understanding of compensatory strategies for short term memory deficits.    Time  4    Period  Weeks    Status  New      OT SHORT TERM GOAL #3   Title  Pt will perform basic home management/ cooking with supervision and no LOB demonstrating good safety awareness.    Time  4    Period  Weeks    Status  New      OT SHORT TERM GOAL #4   Title  Pt demonstrate selective attention to a functional task  in a busy environment x 20 without redirection.    Time  4    Period  Weeks    Status  New      OT SHORT TERM GOAL #5   Title  Further assess cogntion and set additional goals prn    Time  4    Period  Weeks    Status  New        OT Long Term Goals - 09/19/17 1141      OT LONG TERM  GOAL #1   Title  Pt will perform home managment and cooking activities modified independently demonstrating good safety awareness and no LOB.    Time  8    Period  Weeks    Status  New      OT LONG TERM GOAL #2   Title  Pt will demonstrate ability to perform a physical and cognitive task simultaneously with 90% or better accuracy.    Time  8    Period  Weeks    Status  New            Plan - 09/19/17 1126    Clinical Impression Statement  David [name] is a 77 year old right handed male with  who was admitted on 08/31/2016 with confusion, left sided numbness and  weakness as well as speech difficulty. CT of head was negative for acute changes. MRI of brain done revealing 2 small areas of restricted diffusion in the right frontal cortex compatible with acute infarct.Patient transferred to CIR on 09/04/2017. Pt presents to occupational therapy with the following deficits: cognitive deficits, decreased balance, decreased activity tolerance, decreased strength which impedes performance of ADLs/IADLs. Pt can benefir from skilled occupational therapy to maximize safety and independence with daily activities.    Occupational Profile and client history currently impacting functional performance  Prior to CVA pt was modified independent with all basic ADLS, IADLS and ambulating with a cane, he enjoys feeding the birds.PMH:CVA, history of type 2 diabetes mellitus with neuropathy, CAD, cardiac arrest in hospital, R toe amputations, CKD stage III, age-related macular degeneration    Occupational performance deficits (Please refer to evaluation for details):  ADL's;IADL's;Social Participation    Rehab Potential  Good    Current Impairments/barriers affecting progress:  cogntive deficits, toe amputations, neuropathy affecting mobility    OT Frequency  2x / week plus eval, anticipate d/c earlier dependnt on pt progress.    OT Duration  8 weeks    OT Treatment/Interventions  Self-care/ADL  training;Therapeutic exercise;Visual/perceptual remediation/compensation;Patient/family education;Neuromuscular education;Therapeutic activities;Functional Mobility Training;Energy conservation;Cryotherapy;Ultrasound;Passive range of motion;Cognitive remediation/compensation;Manual Therapy;DME and/or AE instruction;Fluidtherapy;Moist Heat;Paraffin    Plan  further assess cognition and add goals prn, trailmaking B, theraband HEP    Clinical Decision Making  Limited treatment options, no task modification necessary    Consulted and Agree with Plan of Care  Patient;Family member/caregiver       Patient will benefit from skilled therapeutic intervention in order to improve the following deficits and impairments:  Abnormal gait, Decreased cognition, Decreased knowledge of use of DME, Impaired flexibility, Impaired vision/preception, Decreased mobility, Decreased activity tolerance, Decreased endurance, Impaired UE functional use, Difficulty walking, Decreased safety awareness, Decreased knowledge of precautions, Decreased balance  Visit Diagnosis: Cognitive social or emotional deficit following cerebral infarction  Other abnormalities of gait and mobility  Muscle weakness (generalized)    Problem List Patient Active Problem List   Diagnosis Date Noted  . Ischemic stroke of frontal lobe ([place]) 09/04/2017  . Agitation   . Restless   . Diastolic dysfunction   . Coronary artery disease involving native coronary artery of native heart without angina pectoris   . Cardiac pacemaker in situ   . Stage 3 chronic kidney disease ([place])   . Atrial fibrillation with rapid ventricular response ([place])   . Acute blood loss anemia   . PVD (peripheral vascular disease) ([place])   . Tachypnea   . Acute ischemic stroke ([place]) 08/31/2017  . Lower extremity edema 04/21/2017  . Anxiety 03/22/2017  . Acute cholecystitis s/p lap cholecystectomy 03/05/2017 03/04/2017  . HOCM (hypertrophic obstructive cardiomyopathy)  ([place]) 03/04/2017  . 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 Fatigue 01/27/2017  . Low vitamin B12 level 01/27/2017  . Chronic ulcer of right foot ([place]) 08/09/2016  . Syncope 07/24/2015  . 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 OSA on CPAP 10/26/2014  . Back pain 10/16/2014  . Left leg numbness 10/16/2014  . Episodic lightheadedness 08/04/2014  . Open toe wound 07/18/2014  . Diabetes mellitus with neurological manifestations, uncontrolled ([place]) 02/27/2014  . Seasonal and perennial allergic rhinitis 10/25/2013  . Hypertension associated with diabetes ([place]) 10/25/2013  . Hyperlipidemia associated with type 2 diabetes mellitus ([place]) 10/25/2013  . Morbid obesity ([place]) 10/25/2013  . CKD stage  3 due to type 2 diabetes mellitus ([place]) 10/25/2013    Salima Rumer 09/19/2017, 4:43 PM Theone Murdoch, OTR/L [phone] Phone: [phone] 4:47 PM 09/19/17 [facility] [address] [place], [name], [address] Phone: [phone]   Fax:  [phone]  Name: David [name] MRN: [id number] Date of Birth: 1941-05-02

## 2017-09-19 NOTE — Telephone Encounter (Signed)
Per CMP, OK to refill but does want pt to come in for F/U.

## 2017-09-20 ENCOUNTER — Telehealth: Payer: Self-pay | Admitting: Nutrition

## 2017-09-20 ENCOUNTER — Encounter: Payer: Medicare Other | Attending: Endocrinology | Admitting: Nutrition

## 2017-09-20 DIAGNOSIS — E1149 Type 2 diabetes mellitus with other diabetic neurological complication: Secondary | ICD-10-CM

## 2017-09-20 DIAGNOSIS — Z713 Dietary counseling and surveillance: Secondary | ICD-10-CM | POA: Insufficient documentation

## 2017-09-20 DIAGNOSIS — E1165 Type 2 diabetes mellitus with hyperglycemia: Secondary | ICD-10-CM | POA: Insufficient documentation

## 2017-09-20 DIAGNOSIS — Z794 Long term (current) use of insulin: Secondary | ICD-10-CM | POA: Diagnosis not present

## 2017-09-20 DIAGNOSIS — IMO0002 Reserved for concepts with insufficient information to code with codable children: Secondary | ICD-10-CM

## 2017-09-20 NOTE — Telephone Encounter (Signed)
He will need to increase his evening Lantus to 24 units since blood sugars are higher in the morning

## 2017-09-20 NOTE — Telephone Encounter (Signed)
I trained David [name]'s 2 children on how to use the insulin pump.  They understand that he will continue to take the injections, until you tell them.  She wanted me to let you know that his FBSs are still in the low to mid 200s.  She did not bring the readings with her, but says the other readings "are ok".  They are giving him Lantus bid, and Humalog 7u acB and 5u acL and acS.

## 2017-09-20 NOTE — Telephone Encounter (Signed)
Spoke with his daughter.  Gave her the message below, and she reverbalized the correct dosage and time.   She is anxious to start him back on his pump, and was wondering when they will be able to do this.  He does not have an appt. With you.  I told her to call blood sugars to your office in one week. She agreed to do this.

## 2017-09-20 NOTE — Progress Notes (Addendum)
Mr. Ammon son and daughter were shown how to fill a cartridge, and attach and fill an infusion set.  They were also shown how to give a bolus.  They do not know how to count carbs, and so a 1u/10 grams of carb was set up and they were shown how to give appropriate mealtime insulin using meal time insulin doses he is doing now X 10 and putting those carbs into the bolus wizard.  (70 for breakfast,unless cereal, then use 90, and 50 for lunch and supper. His daughter wanted to review what to give him for meals, and we reviewed the need for balanced meals, no more than 3 servings of carbohydrate, and very small amount of fat.  Handout given with carb serving sizes, protein suggestions and fat portion sizes, as well as label reading. They reported good understanding of this with no final questions. Basal rates put into pump per Dr. Ronnie Derby last office note:  MN: 0.55, 6AM: 0.8, 1PM: 0.85, 9PM: 0.6.  I/C ratio: 10, ISF: 50, target: 120-150.  Basal rates were not as ordered in old settings.

## 2017-09-21 NOTE — Patient Instructions (Signed)
Call if questions. 

## 2017-09-22 ENCOUNTER — Telehealth: Payer: Self-pay | Admitting: Endocrinology

## 2017-09-22 ENCOUNTER — Ambulatory Visit: Payer: Medicare Other | Admitting: Endocrinology

## 2017-09-22 NOTE — Telephone Encounter (Signed)
Need to know what all his blood sugars are the last 2 days and whether he is watching his diet

## 2017-09-22 NOTE — Telephone Encounter (Signed)
Attempted to call patient and get blood sugar readings for previous two days. Patient did not answer and message was left for patient to call back.

## 2017-09-22 NOTE — Telephone Encounter (Signed)
Left a message for him to increase his evening Lantus to 30 units and call us back with blood sugar readings

## 2017-09-22 NOTE — Telephone Encounter (Signed)
Patient daughter Almyra Free calling she is having trouble with patient b/s increasing the morning, it was 290  please call her [phone]

## 2017-09-22 NOTE — Telephone Encounter (Signed)
Please advise 

## 2017-09-25 ENCOUNTER — Encounter: Payer: Medicare Other | Attending: Physical Medicine & Rehabilitation

## 2017-09-25 ENCOUNTER — Encounter: Payer: Self-pay | Admitting: Physical Medicine & Rehabilitation

## 2017-09-25 ENCOUNTER — Ambulatory Visit (HOSPITAL_BASED_OUTPATIENT_CLINIC_OR_DEPARTMENT_OTHER): Payer: Medicare Other | Admitting: Physical Medicine & Rehabilitation

## 2017-09-25 VITALS — BP 156/77 | HR 69 | Ht 76.0 in | Wt 269.0 lb

## 2017-09-25 DIAGNOSIS — N183 Chronic kidney disease, stage 3 unspecified: Secondary | ICD-10-CM

## 2017-09-25 DIAGNOSIS — E1165 Type 2 diabetes mellitus with hyperglycemia: Secondary | ICD-10-CM | POA: Diagnosis not present

## 2017-09-25 DIAGNOSIS — I509 Heart failure, unspecified: Secondary | ICD-10-CM | POA: Insufficient documentation

## 2017-09-25 DIAGNOSIS — Z950 Presence of cardiac pacemaker: Secondary | ICD-10-CM | POA: Insufficient documentation

## 2017-09-25 DIAGNOSIS — Z87891 Personal history of nicotine dependence: Secondary | ICD-10-CM | POA: Insufficient documentation

## 2017-09-25 DIAGNOSIS — G4730 Sleep apnea, unspecified: Secondary | ICD-10-CM | POA: Insufficient documentation

## 2017-09-25 DIAGNOSIS — R269 Unspecified abnormalities of gait and mobility: Secondary | ICD-10-CM | POA: Diagnosis not present

## 2017-09-25 DIAGNOSIS — IMO0002 Reserved for concepts with insufficient information to code with codable children: Secondary | ICD-10-CM

## 2017-09-25 DIAGNOSIS — I63031 Cerebral infarction due to thrombosis of right carotid artery: Secondary | ICD-10-CM | POA: Diagnosis not present

## 2017-09-25 DIAGNOSIS — I639 Cerebral infarction, unspecified: Secondary | ICD-10-CM | POA: Insufficient documentation

## 2017-09-25 DIAGNOSIS — Z794 Long term (current) use of insulin: Secondary | ICD-10-CM | POA: Diagnosis not present

## 2017-09-25 DIAGNOSIS — I69398 Other sequelae of cerebral infarction: Secondary | ICD-10-CM | POA: Diagnosis not present

## 2017-09-25 DIAGNOSIS — E1149 Type 2 diabetes mellitus with other diabetic neurological complication: Secondary | ICD-10-CM | POA: Diagnosis not present

## 2017-09-25 DIAGNOSIS — D509 Iron deficiency anemia, unspecified: Secondary | ICD-10-CM | POA: Insufficient documentation

## 2017-09-25 DIAGNOSIS — E1122 Type 2 diabetes mellitus with diabetic chronic kidney disease: Secondary | ICD-10-CM | POA: Diagnosis not present

## 2017-09-25 DIAGNOSIS — E1140 Type 2 diabetes mellitus with diabetic neuropathy, unspecified: Secondary | ICD-10-CM | POA: Diagnosis not present

## 2017-09-25 DIAGNOSIS — I130 Hypertensive heart and chronic kidney disease with heart failure and stage 1 through stage 4 chronic kidney disease, or unspecified chronic kidney disease: Secondary | ICD-10-CM | POA: Diagnosis not present

## 2017-09-25 DIAGNOSIS — I2510 Atherosclerotic heart disease of native coronary artery without angina pectoris: Secondary | ICD-10-CM | POA: Insufficient documentation

## 2017-09-25 NOTE — Patient Instructions (Addendum)
Graduated return to driving instructions were provided. It is recommended that the patient first drives with another licensed driver in an empty parking lot. If the patient does well with this, and they can drive on a quiet street with the licensed driver. If the patient does well with this they can drive on a busy street with a licensed driver. If the patient does well with this, the next time out they can go by himself. For the first month after resuming driving, I recommend no nighttime or Interstate driving.   Need Eye eval prior to driving  Goal weight 200

## 2017-09-25 NOTE — Progress Notes (Signed)
Subjective:  Transitional care visit, phone call completed on 09/14/2017  Patient ID: David [name], male    DOB: 1941/04/10, 77 y.o.   MRN: [id number]  77 year old right-handed male with history of type II DM, coronary artery disease, HCM status post permanent pacemaker, CKD stage III, age-related macular degeneration; who was admitted on 08/31/2016 with confusion left-sided numbness and weakness as well as speech difficulty.  MRI of brain done revealing 2 small areas of restricted diffusion in right frontal cortex compatible with acute infarct. TEE showed normal LVEF, no PFO no thrombus and fixed plaque in thoracic aorta.  Throat stroke felt to be embolic due to unknown source and aspirin Plavix recommended x3 weeks followed by Plavix alone.  Hospital course significant for episode of syncope with fall and lack of pulse requiring CPR x1 minute as well as dose of atropine with return of pulse.  PPM was interrogated and no A. fib noted.  Syncope felt to be due to hypotension and medications were adjusted.  He has had few beats of asymptomatic NSVT therefore Coreg was resumed as well as addition of  torsemide  prior to discharge   HPI  Independent with walker at home Dressing and bathing Mod I No Falls  Patient has followed up with his endocrinologist Dr. Dwyane Dee the patient has had one PT and one OT visit as an outpatient.   Will see Dr [name] PCP in am Occ confusion short term memory, this occurred to some degree prior to his stroke but a little bit more prominent now. He was driving prior to his stroke.  He did not have any seizure with the stroke.  He did have a hypotensive episode but this has resolved. He has not had an eye exam for greater than 1 year and has diabetes as well as macular degeneration. Pain Inventory Average Pain 0 Pain Right Now 0 My pain is no pain  In the last 24 hours, has pain interfered with the following? General activity 0 Relation with others 0 Enjoyment of  life 0 What TIME of day is your pain at its worst? no pain Sleep (in general) Fair  Pain is worse with: no pain Pain improves with: no pain Relief from Meds: no pain  Mobility use a cane use a walker how many minutes can you walk? 5 ability to climb steps?  no do you drive?  no  Function retired  Neuro/Psych bladder control problems weakness confusion anxiety  Prior Studies Any changes since last visit?  no  Physicians involved in your care Any changes since last visit?  no   Family History  Problem Relation Age of Onset  . Cancer Mother        breast  . Heart attack Father    Social History   Socioeconomic History  . Marital status: Married    Spouse name: Not on file  . Number of children: 3  . Years of education: 9  . Highest education level: Not on file  Occupational History  . Occupation: Retired  Scientific laboratory technician  . Financial resource strain: Not on file  . Food insecurity:    Worry: Not on file    Inability: Not on file  . Transportation needs:    Medical: Not on file    Non-medical: Not on file  Tobacco Use  . Smoking status: Former Smoker    Packs/day: 2.00    Years: 30.00    Pack years: 60.00    Last attempt to quit:  03/03/1984    Years since quitting: 33.5  . Smokeless tobacco: Former Network engineer and Sexual Activity  . Alcohol use: Yes    Alcohol/week: 0.6 oz    Types: 1 Glasses of wine per week    Comment: social  . Drug use: No  . Sexual activity: Not Currently  Lifestyle  . Physical activity:    Days per week: Not on file    Minutes per session: Not on file  . Stress: Not on file  Relationships  . Social connections:    Talks on phone: Not on file    Gets together: Not on file    Attends religious service: Not on file    Active member of club or organization: Not on file    Attends meetings of clubs or organizations: Not on file    Relationship status: Not on file  Other Topics Concern  . Not on file  Social History  Narrative   Currently resides with his wife. 1 dog. Fun/Hobby: Golf    Denies any religious beliefs effecting health care.    Past Surgical History:  Procedure Laterality Date  . 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 AMPUTATION TOE Right 03/12/2015   Procedure: RIGHT HALLUS AMPUTATION ;  Surgeon: [name], MD;  Location: [facility];  Service: Podiatry;  Laterality: Right;  . CARDIAC CATHETERIZATION  11-25-2010   [place], [place]   Nonobstructive CAD  . CARDIAC PACEMAKER PLACEMENT  Nov 2009   [employer]  . CHOLECYSTECTOMY N/A 03/05/2017   Procedure: LAPAROSCOPIC CHOLECYSTECTOMY WITH INTRAOPERATIVE CHOLANGIOGRAM;  Surgeon: [name], MD;  [facility];  Service: General;  Laterality: N/A;  . EP IMPLANTABLE DEVICE N/A 06/03/2015   Procedure: PPM Generator Changeout;  Surgeon: [name], MD;  Location: [facility];  Service: Cardiovascular;  Laterality: N/A;  . EXCISION BONE CYST Right 03/06/2015   Procedure: BONE BIOPSIES OF RIGHT FOOT;  Surgeon: [name], MD;  Location: [facility];  Service: Podiatry;  Laterality: Right;  . ORIF ANKLE FRACTURE Left 11/06/2014   Procedure: OPEN REDUCTION INTERNAL FIXATION (ORIF) LEFT  ANKLE FRACTURE;  Surgeon: [name], MD;  Location: [facility];  Service: Orthopedics;  Laterality: Left;  . TEE WITHOUT CARDIOVERSION N/A 09/01/2017   Procedure: TRANSESOPHAGEAL ECHOCARDIOGRAM (TEE);  Surgeon: [name], MD;  Location: [facility];  Service: Cardiovascular;  Laterality: N/A;  . TOTAL KNEE ARTHROPLASTY    . VEIN LIGATION AND STRIPPING     Past Medical History:  Diagnosis Date  . Anemia, iron deficiency   . Anxiety   . Arthritis   . BPH (benign prostatic hypertrophy)   . CAD (coronary artery disease)    Nonobstructive CAD per cath  . Cardiac pacemaker in situ   . CHF (congestive heart failure) ([place])   . Chronic ulcer of right foot ([place])   .  CKD (chronic kidney disease), stage III ([place]) secondary to DM and HTN   nephrologist-  [name]  . Dyspnea   . History of cellulitis    right great toe 10-25-2014  . History of skin cancer   . HOCM (hypertrophic obstructive cardiomyopathy) ([place])   . Hypertension   . Insulin dependent type 2 diabetes mellitus ([place]) 1991   followd by [name]--  has insulin pump  . Insulin pump in place   . OSA on CPAP   . Peripheral neuropathy    severe  .  Peripheral vascular disease ([place])    bilateral lower extremities  . Rib fracture 07/24/2015  . Secondary hyperparathyroidism of renal origin ([place])   . Sinus node dysfunction (HCC)   . Sleep apnea    BP (!) 156/77   Pulse 69   Ht 6\' 4"  (1.93 m)   Wt 269 lb (122 kg)   SpO2 97%   BMI 32.74 kg/m   Opioid Risk Score:   Fall Risk Score:  `1  Depression screen PHQ 2/9  Depression screen [facility] 2/9 09/25/2017 08/17/2017 06/22/2017 05/18/2016 06/08/2015 05/04/2015  Decreased Interest 0 0 0 0 0 0  Down, Depressed, Hopeless 0 0 0 0 0 0  PHQ - 2 Score 0 0 0 0 0 0  Altered sleeping 0 - - - - -  Tired, decreased energy 0 - - - - -  Change in appetite 0 - - - - -  Feeling bad or failure about yourself  1 - - - - -  Trouble concentrating 0 - - - - -  Moving slowly or fidgety/restless 0 - - - - -  Suicidal thoughts 0 - - - - -  PHQ-9 Score 1 - - - - -  Some recent data might be hidden    Review of Systems  Constitutional: Negative.   HENT: Negative.   Eyes: Negative.   Respiratory: Negative.   Cardiovascular: Negative.   Gastrointestinal: Negative.   Endocrine:       High blood sugars  Genitourinary:       Bladder control  Musculoskeletal: Negative.   Skin: Negative.   Allergic/Immunologic: Negative.   Neurological: Positive for weakness.  Hematological: Bruises/bleeds easily.       Plavix  Psychiatric/Behavioral: Positive for confusion. The patient is nervous/anxious.   All other systems reviewed and are negative.      Objective:    Physical Exam  Constitutional: He is oriented to person, place, and time. He appears well-developed and well-nourished.  Obese  HENT:  Head: Normocephalic and atraumatic.  Eyes: Pupils are equal, round, and reactive to light. EOM are normal.  Cardiovascular: Normal rate, regular rhythm, normal heart sounds and intact distal pulses.  No murmur heard. Pulmonary/Chest: Effort normal and breath sounds normal. No stridor. No respiratory distress. He has no wheezes.  Abdominal: Soft. Bowel sounds are normal. He exhibits no distension. There is no tenderness.  Neurological: He is alert and oriented to person, place, and time.  Psychiatric: He has a normal mood and affect. His behavior is normal. Judgment and thought content normal.   Motor strength is 5/5 bilateral deltoid bicep tricep grip hip flexor knee extensor ankle dorsiflexor Gait is wide-based using a rolling walker short step length.  No evidence of ataxia  Cerebellar testing no evidence of dysmetria finger-nose-finger Visual fields are intact Extraocular muscles intact Finger to thumb opposition equal bilaterally no evidence of dysdiadochokinesis.  Sensation intact light touch bilateral upper and lower limbs No evidence of facial droop no evidence dysarthria no evidence of a aphasia    Assessment & Plan:  #1.  Small right frontal cortex CVA x2 with resultant gait disorder and mild short-term memory impairment.  He is completed inpatient rehabilitation and is now getting outpatient PT OT. He has returned to a modified independent level using a walker.  He did not use a walker prior to his stroke however and this would be a goal for him. We discussed that he will likely need more extensive PT than OT given his lack  of significant upper extremity motor findings.  We discussed return to driving.  Given his history of macular degeneration and history of diabetes, ophthalmology evaluation would be needed to clear him from a vision  standpoint. If he gets the okay from ophthalmology he may return to driving in a graduated fashion  Graduated return to driving instructions were provided. It is recommended that the patient first drives with another licensed driver in an empty parking lot. If the patient does well with this, and they can drive on a quiet street with the licensed driver. If the patient does well with this they can drive on a busy street with a licensed driver. If the patient does well with this, the next time out they can go by himself. For the first month after resuming driving, I recommend no nighttime or Interstate driving.   Is cold medicine and rehabilitation follow-up as needed Follow-up with PCP Follow-up with neurology Follow-up with endocrinology

## 2017-09-26 ENCOUNTER — Encounter: Payer: Self-pay | Admitting: Family Medicine

## 2017-09-26 ENCOUNTER — Ambulatory Visit (INDEPENDENT_AMBULATORY_CARE_PROVIDER_SITE_OTHER): Payer: Medicare Other | Admitting: Family Medicine

## 2017-09-26 VITALS — BP 136/74 | HR 80 | Temp 97.8°F | Resp 16 | Ht 76.0 in | Wt 266.0 lb

## 2017-09-26 DIAGNOSIS — Z794 Long term (current) use of insulin: Secondary | ICD-10-CM

## 2017-09-26 DIAGNOSIS — N184 Chronic kidney disease, stage 4 (severe): Secondary | ICD-10-CM

## 2017-09-26 DIAGNOSIS — I10 Essential (primary) hypertension: Secondary | ICD-10-CM | POA: Diagnosis not present

## 2017-09-26 DIAGNOSIS — E119 Type 2 diabetes mellitus without complications: Secondary | ICD-10-CM

## 2017-09-26 DIAGNOSIS — N186 End stage renal disease: Secondary | ICD-10-CM | POA: Insufficient documentation

## 2017-09-26 DIAGNOSIS — I69311 Memory deficit following cerebral infarction: Secondary | ICD-10-CM | POA: Diagnosis not present

## 2017-09-26 DIAGNOSIS — E1159 Type 2 diabetes mellitus with other circulatory complications: Secondary | ICD-10-CM | POA: Diagnosis not present

## 2017-09-26 DIAGNOSIS — I152 Hypertension secondary to endocrine disorders: Secondary | ICD-10-CM

## 2017-09-26 DIAGNOSIS — I639 Cerebral infarction, unspecified: Secondary | ICD-10-CM

## 2017-09-26 DIAGNOSIS — IMO0001 Reserved for inherently not codable concepts without codable children: Secondary | ICD-10-CM

## 2017-09-26 DIAGNOSIS — I69392 Facial weakness following cerebral infarction: Secondary | ICD-10-CM

## 2017-09-26 HISTORY — DX: Chronic kidney disease, stage 4 (severe): N18.4

## 2017-09-26 LAB — BASIC METABOLIC PANEL
BUN: 77 mg/dL — ABNORMAL HIGH (ref 6–23)
CO2: 25 mEq/L (ref 19–32)
Calcium: 9.7 mg/dL (ref 8.4–10.5)
Chloride: 106 mEq/L (ref 96–112)
Creatinine, Ser: 2.52 mg/dL — ABNORMAL HIGH (ref 0.40–1.50)
GFR: 26.5 mL/min — ABNORMAL LOW (ref >60.00)
Glucose, Bld: 264 mg/dL — ABNORMAL HIGH (ref 70–99)
Potassium: 4.9 mEq/L (ref 3.5–5.1)
Sodium: 142 mEq/L (ref 135–145)

## 2017-09-26 NOTE — Assessment & Plan Note (Signed)
Patient found to have rising creatinine during his hospitalization.  Unclear if this is a new baseline.  Check BMET today.  Patient is already established with nephrologist.  Advised him to call to schedule appointment soon.

## 2017-09-26 NOTE — Progress Notes (Signed)
Subjective:  David [name] is a 77 y.o. male who presents today for a hospital and rehab follow up visit.  HPI:  Summary of Hospital admission: Reason for admission: Stroke Date of admission: 08/30/2017 Date of discharge: 09/04/2017 Summary of Hospital course: Patient presented to the ED with 2-day history of left hand weakness, dysarthria, and left-sided facial droop.  Stroke work-up was performed patient was found to have a embolic right MCA stroke.  During hospitalization, had one episode of unresponsiveness that was deemed to be due to hypotension and orthostatic hypotension.  CCM was consulted however signed off on 09/03/2017.  The neurology team was consulted during his hospitalization, however did not find any cardiac source for his embolism.  Patient was started on dual antiplatelet of aspirin 325 mg daily and Plavix 75 mg daily for 3 weeks, then followed by Plavix alone.  Due to his low blood pressures, patient's doxazosin and lisinopril were held at discharge and his torsemide was restarted at half the dose at 10 mg twice daily.  Patient was discharged to [facility] on 09/04/2017 for inpatient PT, ST, and OT.    Summary of CIR Admission: Reason for admission: Stroke Date of admission: 09/04/2017 Date of discharge: 09/12/2017 Summary of rehab course: Patient was found to have a slight rise in serum creatinine during his inpatient rehab stay.  Cardiology was consulted and discontinued his lisinopril.  Patient was discharged from [facility] on 09/12/2017 and will be having outpatient PT and ST.  Summary of outpatient visit with physiologist: Patient saw his physiologist for a transition of care visit yesterday 09/25/2017.  At that point, patient had return to using a walker to help him ambulate.  Patient was also giving graduated return to driving instructions.  Right frontal lobe stroke, new problem Please see above summary.  Patient suffered a stroke approximately 3 weeks ago.  Symptoms have  improved significantly over the last few weeks.  He has been working with rehab and has done well.  Still has a little bit of left facial droop, however otherwise his weakness and numbness in his extremities has improved significantly.  Still having some issues with short-term memory.  He is still taking both aspirin and Plavix.  Hypertension, chronic problem, stable As noted above, patient was instructed to stop his lisinopril.  Patient however is still taking this medication.  He is also taking Coreg 25 mg twice daily, Imdur 30 mg daily, and torsemide 10 mg twice daily.  He has been tolerating all his meds well without side effects.  Type 2 diabetes, chronic problem, stable Patient was previously on an insulin pump prior to his hospitalization.  Since being discharged he has been transitioned to subcutaneous insulin.  Current regimen is Lantus 18 units in the morning and 30 units in the evening.  Is also taking NovoLog 5 units with meals.  He will be following up with his endocrinologist soon.  Sugars have been "okay."  ROS: Per HPI, otherwise a complete review of systems was negative.   PMH:  The following were reviewed and entered/updated in epic: Past Medical History:  Diagnosis Date  . Anemia, iron deficiency   . Anxiety   . Arthritis   . BPH (benign prostatic hypertrophy)   . CAD (coronary artery disease)    Nonobstructive CAD per cath  . Cardiac pacemaker in situ   . CHF (congestive heart failure) ([place])   . Chronic ulcer of right foot ([place])   . CKD (chronic kidney disease), stage  III ([place]) secondary to DM and HTN   nephrologist-  [name]  . Dyspnea   . History of cellulitis    right great toe 10-25-2014  . History of skin cancer   . HOCM (hypertrophic obstructive cardiomyopathy) ([place])   . Hypertension   . Insulin dependent type 2 diabetes mellitus ([place]) 1991   followd by [name]--  has insulin pump  . Insulin pump in place   . OSA on CPAP   . Peripheral neuropathy     severe  . Peripheral vascular disease ([place])    bilateral lower extremities  . Rib fracture 07/24/2015  . Secondary hyperparathyroidism of renal origin ([place])   . Sinus node dysfunction (HCC)   . Sleep apnea    Patient Active Problem List   Diagnosis Date Noted  . CKD (chronic kidney disease) stage 4, GFR 15-29 ml/min (HCC) 09/26/2017  . 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 Diastolic dysfunction   . Coronary artery disease involving native coronary artery of native heart without angina pectoris   . Cardiac pacemaker in situ   . Atrial fibrillation with rapid ventricular response ([place])   . PVD (peripheral vascular disease) ([place])   . Lower extremity edema 04/21/2017  . Anxiety 03/22/2017  . Acute cholecystitis s/p lap cholecystectomy 03/05/2017 03/04/2017  . HOCM (hypertrophic obstructive cardiomyopathy) ([place]) 03/04/2017  . 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 Fatigue 01/27/2017  . Low vitamin B12 level 01/27/2017  . Syncope 07/24/2015  . 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 OSA on CPAP 10/26/2014  . Back pain 10/16/2014  . Seasonal and perennial allergic rhinitis 10/25/2013  . Hypertension associated with diabetes ([place]) 10/25/2013  . Hyperlipidemia associated with type 2 diabetes mellitus ([place]) 10/25/2013  . Morbid obesity ([place]) 10/25/2013   Past Surgical History:  Procedure Laterality Date  . AMPUTATION OF REPLICATED TOES  Mar 5638   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)  . AMPUTATION TOE Right 03/12/2015   Procedure: RIGHT HALLUS AMPUTATION ;  Surgeon: [name], MD;  Location: [place];  Service: Podiatry;  Laterality: Right;  . CARDIAC CATHETERIZATION  11-25-2010   [place], [place]   Nonobstructive CAD  . CARDIAC PACEMAKER PLACEMENT  Nov 2009   [employer]  . CHOLECYSTECTOMY N/A 03/05/2017   Procedure: LAPAROSCOPIC CHOLECYSTECTOMY WITH INTRAOPERATIVE  CHOLANGIOGRAM;  Surgeon: [name], MD;  [facility];  Service: General;  Laterality: N/A;  . EP IMPLANTABLE DEVICE N/A 06/03/2015   Procedure: PPM Generator Changeout;  Surgeon: [name], MD;  Location: [facility] [facility];  Service: Cardiovascular;  Laterality: N/A;  . EXCISION BONE CYST Right 03/06/2015   Procedure: BONE BIOPSIES OF RIGHT FOOT;  Surgeon: [name], MD;  Location: [facility];  Service: Podiatry;  Laterality: Right;  . ORIF ANKLE FRACTURE Left 11/06/2014   Procedure: OPEN REDUCTION INTERNAL FIXATION (ORIF) LEFT  ANKLE FRACTURE;  Surgeon: [name], MD;  Location: [place];  Service: Orthopedics;  Laterality: Left;  . TEE WITHOUT CARDIOVERSION N/A 09/01/2017   Procedure: TRANSESOPHAGEAL ECHOCARDIOGRAM (TEE);  Surgeon: [name], MD;  Location: [facility];  Service: Cardiovascular;  Laterality: N/A;  . TOTAL KNEE ARTHROPLASTY    . VEIN LIGATION AND STRIPPING      Family History  Problem Relation Age of Onset  . Cancer Mother        breast  . Heart attack Father  Medications- Reconciled discharge and current medications in Epic.  Current Outpatient Medications  Medication Sig Dispense Refill  . acetaminophen (TYLENOL) 325 MG tablet Take 1-2 tablets (325-650 mg total) by mouth every 4 (four) hours as needed for mild pain.    Marland Kitchen atorvastatin (LIPITOR) 10 MG tablet Take 1 tablet (10 mg total) by mouth daily at 6 PM. 90 tablet 0  . calcitRIOL (ROCALTROL) 0.25 MCG capsule Take 0.25 mcg by mouth daily.     . carvedilol (COREG) 25 MG tablet Take 1 tablet (25 mg total) by mouth 2 (two) times daily with a meal. 60 tablet 0  . cholecalciferol (VITAMIN D) 1000 units tablet Take 1 tablet (1,000 Units total) by mouth daily. 30 tablet 1  . clopidogrel (PLAVIX) 75 MG tablet Take 1 tablet (75 mg total) by mouth daily. 30 tablet 0  . diclofenac sodium (VOLTAREN) 1 % GEL Apply 2 g topically 3 (three) times daily. 4 Tube 0  .  hydrOXYzine (ATARAX/VISTARIL) 50 MG tablet Take 1 tablet (50 mg total) 3 (three) times daily as needed by mouth. 30 tablet 1  . insulin aspart (NOVOLOG PENFILL) cartridge Inject 5 Units into the skin 3 (three) times daily with meals. 15 mL 11  . Insulin Glargine (LANTUS) 100 UNIT/ML Solostar Pen Inject 18 Units into the skin 2 (two) times daily. 15 mL 11  . Insulin Pen Needle (PEN NEEDLES) 32G X 5 MM MISC 1 application by Does not apply route 4 (four) times daily. 100 each 1  . isosorbide mononitrate (IMDUR) 30 MG 24 hr tablet Take 1 tablet (30 mg total) by mouth daily. 30 tablet 0  . lisinopril (PRINIVIL,ZESTRIL) 5 MG tablet Take 1 tablet by mouth daily.  6  . pantoprazole (PROTONIX) 40 MG tablet TAKE ONE TABLET BY MOUTH ONCE DAILY 90 tablet 1  . sertraline (ZOLOFT) 100 MG tablet Take 100 mg by mouth daily.    Marland Kitchen torsemide (DEMADEX) 10 MG tablet Take 1 tablet (10 mg total) by mouth 2 (two) times daily. 60 tablet 0  . vitamin B-12 (CYANOCOBALAMIN) 100 MCG tablet Take 100 mcg by mouth daily.     No current facility-administered medications for this visit.     Allergies-reviewed and updated Allergies  Allergen Reactions  . Adhesive [Tape] Other (See Comments)    blisters    Social History   Socioeconomic History  . Marital status: Married    Spouse name: Not on file  . Number of children: 3  . Years of education: 45  . Highest education level: Not on file  Occupational History  . Occupation: Retired  Scientific laboratory technician  . Financial resource strain: Not on file  . Food insecurity:    Worry: Not on file    Inability: Not on file  . Transportation needs:    Medical: Not on file    Non-medical: Not on file  Tobacco Use  . Smoking status: Former Smoker    Packs/day: 2.00    Years: 30.00    Pack years: 60.00    Last attempt to quit: 03/03/1984    Years since quitting: 33.5  . Smokeless tobacco: Former Network engineer and Sexual Activity  . Alcohol use: Yes    Alcohol/week: 0.6 oz     Types: 1 Glasses of wine per week    Comment: social  . Drug use: No  . Sexual activity: Not Currently  Lifestyle  . Physical activity:    Days per week: Not on file  Minutes per session: Not on file  . Stress: Not on file  Relationships  . Social connections:    Talks on phone: Not on file    Gets together: Not on file    Attends religious service: Not on file    Active member of club or organization: Not on file    Attends meetings of clubs or organizations: Not on file    Relationship status: Not on file  Other Topics Concern  . Not on file  Social History Narrative   Currently resides with his wife. 1 dog. Fun/Hobby: Golf    Denies any religious beliefs effecting health care.     Objective:  Physical Exam: BP 136/74 (BP Location: Left Arm, Cuff Size: Normal)   Pulse 80   Temp 97.8 F (36.6 C) (Oral)   Resp 16   Ht 6\' 4"  (1.93 m)   Wt 266 lb (120.7 kg)   SpO2 99%   BMI 32.38 kg/m   Gen: NAD, resting comfortably HEENT: Mild left facial droop noted. CV: RRR with no murmurs appreciated Pulm: NWOB, CTAB with no crackles, wheezes, or rhonchi GI: Normal bowel sounds present. Soft, Nontender, Nondistended. MSK: No edema, cyanosis, or clubbing noted Skin: Warm, dry Neuro: Mild left facial droop, however otherwise cranial nerves II through XII intact.  Strength 5 out of 5 in upper and lower extremities.  Sensation light touch intact throughout. Psych: Normal affect and thought content  Summary/Review of work up during initial hospitalization: BMET 09/11/2017: Sodium 137, potassium 4.6, chloride 106, bicarb 24, glucose 179, BUN 74, creatinine 2.76  MR Brain 08/31/2017: 2 small areas of right frontal cortex compatible with acute infarct.  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  Will stop aspirin today as he was instructed to only take this for 3 weeks.  Continue Plavix 75 mg daily.  He is on atorvastatin 10 mg  daily.  This was refilled today.  Patient has follow-up with neurology in approximately 1 month.  CKD (chronic kidney disease) stage 4, GFR 15-29 ml/min (HCC) Patient found to have rising creatinine during his hospitalization.  Unclear if this is a new baseline.  Check BMET today.  Patient is already established with nephrologist.  Advised him to call to schedule appointment soon.  Hypertension associated with diabetes ([place]) At goal.  Continue current medications.  Check BMET as noted above.  Will be careful to not over treat hypertension given history of unresponsive episode during his hospitalization that was thought to be due to hypotension.  IDDM (insulin dependent diabetes mellitus) - on insulin pump Continue current insulin regimen of Lantus 18 units in the morning and 30 units in the evening, and NovoLog 5 units with meals.  Defer further management to his endocrinologist.  Patient has a high level of medical complexity due to number of diagnoses/treatment options and amount/complexity of data reviewed.   [name], MD 09/26/2017 12:10 PM

## 2017-09-26 NOTE — Patient Instructions (Signed)
Was very nice to see you today!  Please stop your aspirin.  The neurologist only wanted you to be on this for 3 weeks.  We will continue your Plavix 75 mg daily.  No other medication changes today.   We will check your kidney function today.  Please schedule an appoint with Dr. Servando Salina soon.  You have upcoming appointments with your stroke doctor and Dr. Dwyane Dee.  Please make sure you keep these appointments.  I would like to see you back in 3 to 6 months, or sooner as needed.  Take care, Dr [name]

## 2017-09-26 NOTE — Assessment & Plan Note (Signed)
Continue current insulin regimen of Lantus 18 units in the morning and 30 units in the evening, and NovoLog 5 units with meals.  Defer further management to his endocrinologist.

## 2017-09-26 NOTE — Assessment & Plan Note (Signed)
At goal.  Continue current medications.  Check BMET as noted above.  Will be careful to not over treat hypertension given history of unresponsive episode during his hospitalization that was thought to be due to hypotension.

## 2017-09-26 NOTE — Assessment & Plan Note (Signed)
Stable.  Will stop aspirin today as he was instructed to only take this for 3 weeks.  Continue Plavix 75 mg daily.  He is on atorvastatin 10 mg daily.  This was refilled today.  Patient has follow-up with neurology in approximately 1 month.

## 2017-09-27 ENCOUNTER — Telehealth: Payer: Self-pay | Admitting: Nutrition

## 2017-09-27 ENCOUNTER — Encounter: Payer: Self-pay | Admitting: Endocrinology

## 2017-09-27 ENCOUNTER — Telehealth: Payer: Self-pay

## 2017-09-27 NOTE — Progress Notes (Signed)
Dr Marigene Ehlers interpretation of your lab work:  Your kidney function and electrolytes are al stable. We do not need to do any further testing at this point. Please keep your appointment with your kidney doctor as we discussed during your office visit.    If you have any additional questions, please give [name] a call or send [name] a message through [place].  Take care, Dr [name]

## 2017-09-27 NOTE — Telephone Encounter (Signed)
Blood sugars are continuing to increase.  Need to start using insulin pump with going back to the U-500 insulin as before with the same settings In the meantime increase Lantus to 30 units in the morning and 40 units in the evening Vaughan Basta please make sure family knows how to use the pump with the previous settings

## 2017-09-27 NOTE — Telephone Encounter (Signed)
Patients wife called to give blood sugar readings for past seven days. Readings were taken down for MD review and wife was notified that Dr. Dwyane Dee will advise and someone will call her back later. Wife asked that staff call her at her cell phone number listed in patients chart.

## 2017-09-27 NOTE — Telephone Encounter (Signed)
Message left with wife to increase Lantus dose per Dr. Ronnie Derby order, and to tell them that when the son and daughter are ready, they can put him back on the insulin pump.  She reported good understanding of this and re verbalized the new insulin dose correctly.

## 2017-10-02 ENCOUNTER — Ambulatory Visit: Payer: Medicare Other | Admitting: Orthotics

## 2017-10-02 DIAGNOSIS — S90425A Blister (nonthermal), left lesser toe(s), initial encounter: Secondary | ICD-10-CM

## 2017-10-02 DIAGNOSIS — E0842 Diabetes mellitus due to underlying condition with diabetic polyneuropathy: Secondary | ICD-10-CM

## 2017-10-02 DIAGNOSIS — Z899 Acquired absence of limb, unspecified: Secondary | ICD-10-CM

## 2017-10-02 NOTE — Progress Notes (Signed)
Placed Hallux filler onto foot orthotic to see how that helped balance partioal hallux amput.f

## 2017-10-03 ENCOUNTER — Encounter: Payer: Self-pay | Admitting: Physical Therapy

## 2017-10-03 ENCOUNTER — Ambulatory Visit: Payer: Medicare Other | Admitting: Physical Therapy

## 2017-10-03 ENCOUNTER — Ambulatory Visit (INDEPENDENT_AMBULATORY_CARE_PROVIDER_SITE_OTHER): Payer: Medicare Other | Admitting: Endocrinology

## 2017-10-03 ENCOUNTER — Ambulatory Visit: Payer: Medicare Other | Admitting: Occupational Therapy

## 2017-10-03 ENCOUNTER — Encounter: Payer: Self-pay | Admitting: Endocrinology

## 2017-10-03 VITALS — BP 152/74 | HR 80 | Ht 76.0 in | Wt 272.4 lb

## 2017-10-03 DIAGNOSIS — E1165 Type 2 diabetes mellitus with hyperglycemia: Secondary | ICD-10-CM

## 2017-10-03 DIAGNOSIS — Z794 Long term (current) use of insulin: Secondary | ICD-10-CM

## 2017-10-03 DIAGNOSIS — R41841 Cognitive communication deficit: Secondary | ICD-10-CM | POA: Diagnosis not present

## 2017-10-03 DIAGNOSIS — N289 Disorder of kidney and ureter, unspecified: Secondary | ICD-10-CM | POA: Diagnosis not present

## 2017-10-03 DIAGNOSIS — E6609 Other obesity due to excess calories: Secondary | ICD-10-CM | POA: Diagnosis not present

## 2017-10-03 DIAGNOSIS — Z6834 Body mass index (BMI) 34.0-34.9, adult: Secondary | ICD-10-CM | POA: Diagnosis not present

## 2017-10-03 DIAGNOSIS — R2689 Other abnormalities of gait and mobility: Secondary | ICD-10-CM | POA: Diagnosis not present

## 2017-10-03 DIAGNOSIS — R293 Abnormal posture: Secondary | ICD-10-CM

## 2017-10-03 DIAGNOSIS — I69315 Cognitive social or emotional deficit following cerebral infarction: Secondary | ICD-10-CM | POA: Diagnosis not present

## 2017-10-03 DIAGNOSIS — M6281 Muscle weakness (generalized): Secondary | ICD-10-CM

## 2017-10-03 DIAGNOSIS — I639 Cerebral infarction, unspecified: Secondary | ICD-10-CM | POA: Diagnosis not present

## 2017-10-03 DIAGNOSIS — R29818 Other symptoms and signs involving the nervous system: Secondary | ICD-10-CM | POA: Diagnosis not present

## 2017-10-03 NOTE — Patient Instructions (Signed)
Start VICTOZA injection as shown once daily at the same time of the day.    Dial the dose to 0.6 mg on the pen for the first week.    You may inject in the stomach, thigh or arm. You may experience nausea in the first few days which usually goes away.  You will feel fullness of the stomach with starting the medication and should try to keep the portions at meals small.   After 1 week increase the dose to 1.2mg  daily if no nausea present.    If any questions or concerns are present call the office or the [facility] helpline at [phone]. Visit [email or web address].info/ for more useful information

## 2017-10-03 NOTE — Progress Notes (Signed)
Patient ID: David [name], male   DOB: January 17, 1941, 77 y.o.   MRN: [id number]    Reason for Appointment: Followup for Type 2 Diabetes  History of Present Illness:          Diagnosis: Type 2 diabetes mellitus, date of diagnosis:   1992       Past history:  He was initially treated with metformin and at some point also glipizide. His previous records from out of town are not available and no information is available about his level of control Apparently he was started on insulin in 1994 approximately because of poor control He has been on various insulin regimens over the last several years However even with insulin he has had poor control for at least the last 7 or 8 years. He does not know what his previous A1c levels have been. He had been continued on metformin and glipizide but metformin stopped about 2 years ago because of kidney function abnormality He had been taking Lantus 60 units twice a day with NovoLog previously and also Before his initial consultation in 6/15 he was on NovoLog twice a day and Humalog mix insulin  Because of poor control and large insulin doses he was started on Victoza in 6/15  Recent history:   Medtronic PUMP  regimen with U-500 insulin is as follows BASAL rates:Midnight = 0.55, 6 AM = 0.8, 1 PM = 0.85, 9 PM = 0.60    I/C ratio: 10, ISF: 45, target 120-150.  Active insulin 6 hours  Since 04/28/14 he had been on a Medtronic insulin pump because of persistent poor control and high insulin requirement  Has had A1c's of mostly over 8% but last month it is down to 7.6  Current management, problems and blood sugar patterns:  He was was discharged on Lantus and NovoLog after his hospitalization for his stroke and the pump was not started because of his cognitive dysfunction  However despite increasing his basal insulin doses progressively his blood sugars were persistently high and he has started back on the insulin pump since 09/27/2017 after  instructions by the nurse educator to the daughter who is taking care of him currently  However his pump settings were different than what they had previously been and they have not been changed  Appears that he is getting twice as much carbohydrate coverage on his settings as before  However he has been given mealtime boluses only twice since starting the pump and both the times he ended up with low blood sugars in the 60s, even with 1.5 unit bolus on Thursday  The last few days his blood sugars are still inconsistent but tending to be highest in the mornings overall  Otherwise blood sugars are generally fairly good in the late afternoon and even after supper even without any boluses  However appears that he is not taking his [place] which was recommended and had been taken prior to hospitalization  Overall his diet has been significantly better with cutting back on snacks, higher fat foods and his family is helping supervise him  However his weight is still not improving  His best blood sugar day was yesterday when glucose ranged between 121 and 158 before meals and after supper  Overall his 30-day average on his meter download is 197       Oral hypoglycemic drugs the patient is taking are: None, has renal dysfunction        Glucose monitoring:  done about 3  times a day         Glucometer:  contour     Blood Glucose readings from review of meter as above  Recent fasting readings: Range 135-292 on the pump Nonfasting blood sugar range 61-162   Self-care:   Meals: 2- 3 meals per day.   breakfast is cheerios, egg a toast.  Will have half sandwich with soup at 1 pm , dinner at  6-7 pm.    He thinks his portions are small but periodically eats sweets.   Bedtime snack is usually crackers  with milk or fruit         Exercise:  Walking a little recently  Last  consultation : Most recent: 08/2017      Wt Readings from Last 3 Encounters:  10/03/17 272 lb 6.4 oz (123.6 kg)  09/26/17  266 lb (120.7 kg)  09/25/17 269 lb (122 kg)   Glycemic control:   Lab Results  Component Value Date   HGBA1C 7.6 (H) 08/31/2017   HGBA1C 8.1 (H) 05/23/2017   HGBA1C 7.8 (H) 03/05/2017   Lab Results  Component Value Date   MICROALBUR 7.7 (H) 10/07/2016   LDLCALC 106 (H) 08/31/2017   CREATININE 2.52 (H) 09/26/2017            Lab Results  Component Value Date   FRUCTOSAMINE 315 (H) 07/21/2017   FRUCTOSAMINE 328 (H) 08/05/2016   FRUCTOSAMINE 326 (H) 01/30/2014     Other active problems: See review of systems   Allergies as of 10/03/2017      Reactions   Adhesive [tape] Other (See Comments)   blisters      Medication List        Accurate as of 10/03/17  8:47 PM. Always use your most recent med list.          acetaminophen 325 MG tablet Commonly known as:  TYLENOL Take 1-2 tablets (325-650 mg total) by mouth every 4 (four) hours as needed for mild pain.   atorvastatin 10 MG tablet Commonly known as:  LIPITOR Take 1 tablet (10 mg total) by mouth daily at 6 PM.   calcitRIOL 0.25 MCG capsule Commonly known as:  ROCALTROL Take 0.25 mcg by mouth daily.   carvedilol 25 MG tablet Commonly known as:  COREG Take 1 tablet (25 mg total) by mouth 2 (two) times daily with a meal.   cholecalciferol 1000 units tablet Commonly known as:  VITAMIN D Take 1 tablet (1,000 Units total) by mouth daily.   clopidogrel 75 MG tablet Commonly known as:  PLAVIX Take 1 tablet (75 mg total) by mouth daily.   diclofenac sodium 1 % Gel Commonly known as:  VOLTAREN Apply 2 g topically 3 (three) times daily.   hydrOXYzine 50 MG tablet Commonly known as:  ATARAX/VISTARIL Take 1 tablet (50 mg total) 3 (three) times daily as needed by mouth.   isosorbide mononitrate 30 MG 24 hr tablet Commonly known as:  IMDUR Take 1 tablet (30 mg total) by mouth daily.   lisinopril 5 MG tablet Commonly known as:  PRINIVIL,ZESTRIL Take 1 tablet by mouth daily.   pantoprazole 40 MG  tablet Commonly known as:  PROTONIX TAKE ONE TABLET BY MOUTH ONCE DAILY   sertraline 100 MG tablet Commonly known as:  ZOLOFT Take 100 mg by mouth daily.   torsemide 10 MG tablet Commonly known as:  DEMADEX Take 1 tablet (10 mg total) by mouth 2 (two) times daily.   vitamin B-12 100 MCG tablet  Commonly known as:  CYANOCOBALAMIN Take 100 mcg by mouth daily.       Allergies:  Allergies  Allergen Reactions  . Adhesive [Tape] Other (See Comments)    blisters    Past Medical History:  Diagnosis Date  . Anemia, iron deficiency   . Anxiety   . Arthritis   . BPH (benign prostatic hypertrophy)   . CAD (coronary artery disease)    Nonobstructive CAD per cath  . Cardiac pacemaker in situ   . CHF (congestive heart failure) ([facility])   . Chronic ulcer of right foot ([facility])   . CKD (chronic kidney disease), stage III ([facility]) secondary to DM and HTN   nephrologist-  [name]  . Dyspnea   . History of cellulitis    right great toe 10-25-2014  . History of skin cancer   . HOCM (hypertrophic obstructive cardiomyopathy) ([facility])   . Hypertension   . Insulin dependent type 2 diabetes mellitus ([facility]) 1991   followd by [name]--  has insulin pump  . Insulin pump in place   . OSA on CPAP   . Peripheral neuropathy    severe  . Peripheral vascular disease ([facility])    bilateral lower extremities  . Rib fracture 07/24/2015  . Secondary hyperparathyroidism of renal origin ([facility])   . Sinus node dysfunction ([facility])   . Sleep apnea     Past Surgical History:  Procedure Laterality Date  . 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 AMPUTATION TOE Right 03/12/2015   Procedure: RIGHT HALLUS AMPUTATION ;  Surgeon: [name], MD;  Location: [facility];  Service: Podiatry;  Laterality: Right;  . CARDIAC CATHETERIZATION  11-25-2010   [place], [place]   Nonobstructive CAD  . CARDIAC PACEMAKER PLACEMENT  Nov 2009   Medtronic  . CHOLECYSTECTOMY N/A  03/05/2017   Procedure: LAPAROSCOPIC CHOLECYSTECTOMY WITH INTRAOPERATIVE CHOLANGIOGRAM;  Surgeon: [name], MD;  Location: [facility];  Service: General;  Laterality: N/A;  . EP IMPLANTABLE DEVICE N/A 06/03/2015   Procedure: PPM Generator Changeout;  Surgeon: [name], MD;  Location: [facility];  Service: Cardiovascular;  Laterality: N/A;  . EXCISION BONE CYST Right 03/06/2015   Procedure: BONE BIOPSIES OF RIGHT FOOT;  Surgeon: [name], MD;  Location: [facility];  Service: Podiatry;  Laterality: Right;  . ORIF ANKLE FRACTURE Left 11/06/2014   Procedure: OPEN REDUCTION INTERNAL FIXATION (ORIF) LEFT  ANKLE FRACTURE;  Surgeon: [name], MD;  Location: [facility];  Service: Orthopedics;  Laterality: Left;  . TEE WITHOUT CARDIOVERSION N/A 09/01/2017   Procedure: TRANSESOPHAGEAL ECHOCARDIOGRAM (TEE);  Surgeon: [name], MD;  Location: [facility];  Service: Cardiovascular;  Laterality: N/A;  . TOTAL KNEE ARTHROPLASTY    . VEIN LIGATION AND STRIPPING      Family History  Problem Relation Age of Onset  . Cancer Mother        breast  . Heart attack Father     Social History:  reports that he quit smoking about 33 years ago. He has a 60.00 pack-year smoking history. He has quit using smokeless tobacco. He reports that he drinks about 0.6 oz of alcohol per week. He reports that he does not use drugs.    Review of Systems   Following is a copy of previous note      Lipids: Had been on a Lipitor since late 2014. Also had taken Gembrozil for several years without  apparent side effects.  Both of these were  stopped because of increased liver functions and LFT has been back to normal  HDL is consistently low LDL is below 100   He is taking  fish oil OTC  but triglycerides are high, was prescribed Vascepa but triglycerides are still over 200   Lab Results  Component Value Date   CHOL 165 08/31/2017   CHOL 175 07/21/2017   CHOL 168  05/23/2017   Lab Results  Component Value Date   HDL 20 (L) 08/31/2017   HDL 25.10 (L) 07/21/2017   HDL 22.20 (L) 05/23/2017   Lab Results  Component Value Date   LDLCALC 106 (H) 08/31/2017   LDLCALC 94 02/17/2017   LDLCALC 49 10/29/2013   Lab Results  Component Value Date   TRIG 195 (H) 08/31/2017   TRIG 259.0 (H) 07/21/2017   TRIG 282.0 (H) 05/23/2017   Lab Results  Component Value Date   CHOLHDL 8.3 08/31/2017   CHOLHDL 7 07/21/2017   CHOLHDL 8 05/23/2017   Lab Results  Component Value Date   LDLDIRECT 76.0 07/21/2017   LDLDIRECT 89.0 05/23/2017   LDLDIRECT 77.0 08/05/2016      HYPERTENSION: The blood pressure has been treated with various drugs by his other physicians. Cardura was added when blood pressure was higher previously and he continues to take this He thinks his blood pressure was fluctuating at home recently  BP Readings from Last 3 Encounters:  10/03/17 (!) 152/74  09/26/17 136/74  09/25/17 (!) 156/77     Chronic kidney disease: Etiology unknown, is followed by nephrologist  Last creatinine:  Lab Results  Component Value Date   CREATININE 2.52 (H) 09/26/2017        Has history of Numbness in his feet  since about 2013  He has been wearing diabetic shoes.  He has been followed by podiatrist He is  under the care of a podiatrist for Regular care     Physical Examination:  BP (!) 152/74 (BP Location: Left Arm, Patient Position: Sitting, Cuff Size: Normal)   Pulse 80   Ht 6\' 4"  (1.93 m)   Wt 272 lb 6.4 oz (123.6 kg)   SpO2 97%   BMI 33.16 kg/m     ASSESSMENT:  Diabetes type 2, uncontrolled with obesity See history of present illness for detailed discussion of current diabetes management, blood sugar patterns and problems identified   He is back on the U-500 insulin on his pump since last Thursday also Surprisingly even without any boluses his blood sugars are not high postprandially As discussed above his highest readings are  fasting but not consistent His daughter has not bolused for his meals because of tendency to low blood sugars and fear of hypoglycemia Also not taking Victoza which would likely help overall control, postprandial hyperglycemia, limit his appetite and reduction of tendency to weight gain with insulin alone  RENAL insufficiency: This will continue to be followed by his PCP and nephrologist, last creatinine 2.5   PLAN:   No change in basal settings as yet Victoza 1.2 mg daily in the morning but can use 0.6 for the first week Discussed in detail the actions of Victoza, demonstrated how to do the injections on the insulin pump, doses titration and timing of injection, this was explained to his daughter Will not do any correction boluses unless blood sugars are over 200 Also his sensitivity will be changed to 1: 60 and this was changed in the office today  Continue check blood sugar 4 times a day Infusion session to be changed twice a week and needs to rotate places where he is using the infusion sets  Will not do any mealtime boluses   Patient Instructions  Start VICTOZA injection as shown once daily at the same time of the day.    Dial the dose to 0.6 mg on the pen for the first week.    You may inject in the stomach, thigh or arm. You may experience nausea in the first few days which usually goes away.  You will feel fullness of the stomach with starting the medication and should try to keep the portions at meals small.   After 1 week increase the dose to 1.2mg  daily if no nausea present.    If any questions or concerns are present call the office or the [place] [facility] helpline at [phone]. Visit [email or web address].wall.info/ for more useful information  Counseling time on subjects discussed in assessment and plan sections is over 50% of today's 25 minute visit   Elayne Snare 10/03/2017, 8:47 PM   Note: This office note was prepared with Dragon voice recognition  system technology. Any transcriptional errors that result from this process are unintentional.

## 2017-10-03 NOTE — Patient Instructions (Addendum)
Functional Quadriceps: Sit to Stand    Sit on edge of chair, feet flat on floor. Stand fully upright, straightening knees, then sit back down slowly. Use arms as needed.  Repeat _10_ times per set. Do _1_ sets per session. Do _1-2_ sessions per day.  http://orth.exer.us/734   Copyright  VHI. All rights reserved.   Holding onto something sturdy for balance assistance.  Feet Together, Varied Arm Positions - Eyes Closed    Stand with feet together and arms as needed for balance. Close eyes and visualize upright position. Hold _30_ seconds. Repeat _3_ times per session. Do _1-2_ sessions per day.  Copyright  VHI. All rights reserved.    Single Leg - Eyes Open    Holding support, lift right leg while maintaining balance over left leg. Progress to removing hands from support surface for longer periods of time. Hold__10__ seconds.  Switch leg: lift left leg while maintaining balance over right leg. Hold for 10 seconds. Progress to removing hands from support surface as able.   Repeat _3_ times on each leg per session. Do _1-2_ sessions per day.  Copyright  VHI. All rights reserved.

## 2017-10-03 NOTE — Therapy (Signed)
[name], [name], [address] Phone: [phone]   Fax:  [phone]  Occupational Therapy Treatment  Patient Details  Name: David [name] MRN: [id number] Date of Birth: 1940/10/05 Referring Provider: Dr. Naaman Plummer   Encounter Date: 10/03/2017  OT End of Session - 10/03/17 1323    Visit Number  2    Number of Visits  17    Date for OT Re-Evaluation  11/18/17    Authorization Type  Medicare/ [employer]    OT Start Time  [phone]    OT Stop Time  0935    OT Time Calculation (min)  45 min    Activity Tolerance  Patient tolerated treatment well    Behavior During Therapy  [facility] for tasks assessed/performed       Past Medical History:  Diagnosis Date  . Anemia, iron deficiency   . Anxiety   . Arthritis   . BPH (benign prostatic hypertrophy)   . CAD (coronary artery disease)    Nonobstructive CAD per cath  . Cardiac pacemaker in situ   . CHF (congestive heart failure) ([place])   . Chronic ulcer of right foot ([place])   . CKD (chronic kidney disease), stage III ([place]) secondary to DM and HTN   nephrologist-  [name]  . Dyspnea   . History of cellulitis    right great toe 10-25-2014  . History of skin cancer   . HOCM (hypertrophic obstructive cardiomyopathy) ([place])   . Hypertension   . 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 Insulin pump in place   . OSA on CPAP   . Peripheral neuropathy    severe  . Peripheral vascular disease ([place])    bilateral lower extremities  . Rib fracture 07/24/2015  . Secondary hyperparathyroidism of renal origin ([place])   . Sinus node dysfunction (HCC)   . Sleep apnea     Past Surgical History:  Procedure Laterality Date  . AMPUTATION OF REPLICATED TOES  Mar 1245   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)  . AMPUTATION TOE Right 03/12/2015   Procedure: RIGHT HALLUS AMPUTATION ;  Surgeon: [name], MD;  Location:  [facility];  Service: Podiatry;  Laterality: Right;  . CARDIAC CATHETERIZATION  11-25-2010   [place], [place]   Nonobstructive CAD  . CARDIAC PACEMAKER PLACEMENT  Nov 2009   [employer]  . CHOLECYSTECTOMY N/A 03/05/2017   Procedure: LAPAROSCOPIC CHOLECYSTECTOMY WITH INTRAOPERATIVE CHOLANGIOGRAM;  Surgeon: [name], MD;  [facility];  Service: General;  Laterality: N/A;  . EP IMPLANTABLE DEVICE N/A 06/03/2015   Procedure: PPM Generator Changeout;  Surgeon: [name], MD;  Location: [place] [facility];  Service: Cardiovascular;  Laterality: N/A;  . EXCISION BONE CYST Right 03/06/2015   Procedure: BONE BIOPSIES OF RIGHT FOOT;  Surgeon: [name], MD;  Location: [facility];  Service: Podiatry;  Laterality: Right;  . ORIF ANKLE FRACTURE Left 11/06/2014   Procedure: OPEN REDUCTION INTERNAL FIXATION (ORIF) LEFT  ANKLE FRACTURE;  Surgeon: [name], MD;  Location: [place] [facility];  Service: Orthopedics;  Laterality: Left;  . TEE WITHOUT CARDIOVERSION N/A 09/01/2017   Procedure: TRANSESOPHAGEAL ECHOCARDIOGRAM (TEE);  Surgeon: [name], MD;  Location: [facility];  Service: Cardiovascular;  Laterality: N/A;  . TOTAL KNEE ARTHROPLASTY    . VEIN LIGATION AND STRIPPING      There were no vitals filed  for this visit.  Subjective Assessment - 10/03/17 0853    Subjective   Can my thinking skills and memory get better?     Pertinent History  .PMH:CVA, history of type 2 diabetes mellitus with neuropathy, CAD, cardiac arrest in hospital, R toe amputations, CKD stage III, age-related macular degeneration    Patient Stated Goals  to get back to prior functional level    Currently in Pain?  No/denies         [facility] OT Assessment - 10/03/17 0001      Cognition   MOCA  MOCA: 19/30 with most deficits in memory, attention, and mild visual perceptual deficits.       (Pt also hard of hearing, but therapist still felt attention was impaired with  following directions even when pt was able to hear directions.)         OT Treatments/Exercises (OP) - 10/03/17 0001      ADLs   ADL Comments  Discussed results of MOCA with patient and deficits in working memory, short term memory, selective and alternating attention, and executive functioning deficits. Therapist explained that cognition can definitely improve as we "exercise" our brain, but may still need compensations if it does not make 100% recovery      Cognitive Exercises   Other Cognitive Exercises 1  Trail making Test Part B: Pt had to have instructions repeated x 2 and demo first 2  before understanding. Pt repeated one letter twice therefore setting off incorrect number with corresponding letter for remainder of task. Pt then skipped a number d/t working memory impairments - pt ended up with 2 extra letters and no numbers by end of task. Pt demo decreased working memory, Journalist, newspaper. mental flexibility and alternating attn, and decr. executive functioning skills. Pt was not always aware he was making a mistake until the end when he ended up with extra letters.                OT Short Term Goals - 10/03/17 1326      OT SHORT TERM GOAL #1   Title  I with HEP for UE strength    Time  4    Period  Weeks    Status  New      OT SHORT TERM GOAL #2   Title  Pt will verbalize understanding of compensatory strategies for short term memory deficits.    Time  4    Period  Weeks    Status  New      OT SHORT TERM GOAL #3   Title  Pt will perform basic home management/ cooking with supervision and no LOB demonstrating good safety awareness.    Time  4    Period  Weeks    Status  New      OT SHORT TERM GOAL #4   Title  Pt demonstrate selective attention to a functional task  in a busy environment x 20 without redirection.    Time  4    Period  Weeks    Status  New        OT Long Term Goals - 10/03/17 1326      OT LONG TERM GOAL #1   Title  Pt will perform home managment  and cooking activities modified independently demonstrating good safety awareness and no LOB.    Time  8    Period  Weeks    Status  New      OT LONG TERM GOAL #2  Title  Pt will demonstrate ability to perfrom a physical and cogntive task simultaneously with 90% or better accuracy.    Time  8    Period  Weeks    Status  New      OT LONG TERM GOAL #3   Title  Pt will perform basic money exchange for functional setting with 90% or greater accuracy    Time  8    Period  Weeks    Status  New            Plan - 10/03/17 1323    Clinical Impression Statement  Pt demo decreased working and short term memory, decreased selective and alternating attention, and decreased mental flexibility and executive functioning skills with further cognitive assessment today.     Occupational Profile and client history currently impacting functional performance  Prior to CVA pt was modified independent with all basic ADLS, IADLS and ambulating with a cane, he enjoys feeding the birds.PMH:CVA, history of type 2 diabetes mellitus with neuropathy, CAD, cardiac arrest in hospital, R toe amputations, CKD stage III, age-related macular degeneration    Occupational performance deficits (Please refer to evaluation for details):  ADL's;IADL's;Social Participation    Rehab Potential  Good    Current Impairments/barriers affecting progress:  cogntive deficits, toe amputations, neuropathy affecting mobility    OT Frequency  2x / week    OT Duration  8 weeks    OT Treatment/Interventions  Self-care/ADL training;Therapeutic exercise;Visual/perceptual remediation/compensation;Patient/family education;Neuromuscular education;Therapeutic activities;Functional Mobility Training;Energy conservation;Cryotherapy;Ultrasound;Passive range of motion;Cognitive remediation/compensation;Manual Therapy;DME and/or AE instruction;Fluidtherapy;Moist Heat;Paraffin    Plan  theraband HEP, memory strategies    Consulted and Agree with Plan  of Care  Patient       Patient will benefit from skilled therapeutic intervention in order to improve the following deficits and impairments:  Abnormal gait, Decreased cognition, Decreased knowledge of use of DME, Impaired flexibility, Impaired vision/preception, Decreased mobility, Decreased activity tolerance, Decreased endurance, Impaired UE functional use, Difficulty walking, Decreased safety awareness, Decreased knowledge of precautions, Decreased balance  Visit Diagnosis: Cognitive social or emotional deficit following cerebral infarction  Muscle weakness (generalized)    Problem List Patient Active Problem List   Diagnosis Date Noted  . CKD (chronic kidney disease) stage 4, GFR 15-29 ml/min (HCC) 09/26/2017  . 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 Diastolic dysfunction   . Coronary artery disease involving native coronary artery of native heart without angina pectoris   . Cardiac pacemaker in situ   . Atrial fibrillation with rapid ventricular response ([place])   . PVD (peripheral vascular disease) ([place])   . Lower extremity edema 04/21/2017  . Anxiety 03/22/2017  . Acute cholecystitis s/p lap cholecystectomy 03/05/2017 03/04/2017  . HOCM (hypertrophic obstructive cardiomyopathy) ([place]) 03/04/2017  . 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 Fatigue 01/27/2017  . Low vitamin B12 level 01/27/2017  . Syncope 07/24/2015  . 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 OSA on CPAP 10/26/2014  . Back pain 10/16/2014  . Seasonal and perennial allergic rhinitis 10/25/2013  . Hypertension associated with diabetes ([place]) 10/25/2013  . Hyperlipidemia associated with type 2 diabetes mellitus ([place]) 10/25/2013  . Morbid obesity ([place]) 10/25/2013    Carey Bullocks, OTR/L 10/03/2017, 1:27 PM  [facility] [address] [place], [name], [address] Phone: [phone]   Fax:  [phone]  Name: David [name] MRN: [id number] Date  of Birth: 27-Dec-1940

## 2017-10-04 ENCOUNTER — Other Ambulatory Visit: Payer: Self-pay

## 2017-10-04 ENCOUNTER — Encounter: Payer: Self-pay | Admitting: Endocrinology

## 2017-10-04 MED ORDER — LIRAGLUTIDE 18 MG/3ML ~~LOC~~ SOPN: 1.2000 mg | PEN_INJECTOR | Freq: Every day | SUBCUTANEOUS | 3 refills | Status: DC

## 2017-10-04 NOTE — Therapy (Signed)
[facility] [address] [place], [name], [address] Phone: [phone]   Fax:  [phone]  Physical Therapy Treatment  Patient Details  Name: David [name] MRN: [id number] Date of Birth: [birth year] Referring Provider: Dr. Naaman Plummer   Encounter Date: 10/03/2017  PT End of Session - 10/03/17 0941    Visit Number  2    Number of Visits  17    Date for PT Re-Evaluation  12/17/17    Authorization Type  Medicare; [employer]    Authorization Time Period  09/18/17 to 12/17/17    PT Start Time  0935    PT Stop Time  1015    PT Time Calculation (min)  40 min    Equipment Utilized During Treatment  Gait belt    Activity Tolerance  Patient tolerated treatment well    Behavior During Therapy  [facility] [place] [facility] for tasks assessed/performed       Past Medical History:  Diagnosis Date  . Anemia, iron deficiency   . Anxiety   . Arthritis   . BPH (benign prostatic hypertrophy)   . CAD (coronary artery disease)    Nonobstructive CAD per cath  . Cardiac pacemaker in situ   . CHF (congestive heart failure) ([place])   . Chronic ulcer of right foot ([place])   . CKD (chronic kidney disease), stage III ([place]) secondary to DM and HTN   nephrologist-  [name]  . Dyspnea   . History of cellulitis    right great toe 10-25-2014  . History of skin cancer   . HOCM (hypertrophic obstructive cardiomyopathy) ([place])   . Hypertension   . Insulin dependent type 2 diabetes mellitus ([place]) 1991   followd by [name]--  has insulin pump  . Insulin pump in place   . OSA on CPAP   . Peripheral neuropathy    severe  . Peripheral vascular disease ([place])    bilateral lower extremities  . Rib fracture 07/24/2015  . Secondary hyperparathyroidism of renal origin ([place])   . Sinus node dysfunction (HCC)   . Sleep apnea     Past Surgical History:  Procedure Laterality Date  . AMPUTATION OF REPLICATED TOES  Mar 5456   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)  .  AMPUTATION TOE Right 03/12/2015   Procedure: RIGHT HALLUS AMPUTATION ;  Surgeon: [name], MD;  Location: [facility];  Service: Podiatry;  Laterality: Right;  . CARDIAC CATHETERIZATION  11-25-2010   [place], [place]   Nonobstructive CAD  . CARDIAC PACEMAKER PLACEMENT  Nov 2009   [employer]  . CHOLECYSTECTOMY N/A 03/05/2017   Procedure: LAPAROSCOPIC CHOLECYSTECTOMY WITH INTRAOPERATIVE CHOLANGIOGRAM;  Surgeon: [name], MD;  [facility];  Service: General;  Laterality: N/A;  . EP IMPLANTABLE DEVICE N/A 06/03/2015   Procedure: PPM Generator Changeout;  Surgeon: [name], MD;  Location: [place] [facility];  Service: Cardiovascular;  Laterality: N/A;  . EXCISION BONE CYST Right 03/06/2015   Procedure: BONE BIOPSIES OF RIGHT FOOT;  Surgeon: [name], MD;  Location: [facility];  Service: Podiatry;  Laterality: Right;  . ORIF ANKLE FRACTURE Left 11/06/2014   Procedure: OPEN REDUCTION INTERNAL FIXATION (ORIF) LEFT  ANKLE FRACTURE;  Surgeon: [name], MD;  Location: [facility];  Service: Orthopedics;  Laterality: Left;  . TEE WITHOUT CARDIOVERSION N/A 09/01/2017   Procedure: TRANSESOPHAGEAL ECHOCARDIOGRAM (TEE);  Surgeon: [name], MD;  Location: [facility];  Service: Cardiovascular;  Laterality: N/A;  .  TOTAL KNEE ARTHROPLASTY    . VEIN LIGATION AND STRIPPING      There were no vitals filed for this visit.  Subjective Assessment - 10/03/17 0939    Subjective  No new complaints. No falls or pain to report. Did see the podiatrist who did place a filler into his shoe.     Pertinent History  HTN, morbid obesity, CKD, cardiomyopathy, PPM, macular degeneration, CAD, cardiac arrest in hospital, DM, diabetic neuropathy; PVD, Rt 1-2 toe amputations, partial rt 3rd toe amputation, lt ankle ORIF 6/16    Limitations  Walking    How long can you walk comfortably?  about 10 minutes    Patient Stated Goals  get back to using my cane;  feeding the birds    Currently in Pain?  No/denies    Pain Score  0-No pain         [facility] PT Assessment - 10/03/17 0942      Standardized Balance Assessment   Standardized Balance Assessment  Berg Balance Test;Timed Up and Go Test      Berg Balance Test   Sit to Stand  Able to stand without using hands and stabilize independently    Standing Unsupported  Able to stand safely 2 minutes    Sitting with Back Unsupported but Feet Supported on Floor or Stool  Able to sit safely and securely 2 minutes    Stand to Sit  Sits safely with minimal use of hands    Transfers  Able to transfer safely, minor use of hands    Standing Unsupported with Eyes Closed  Able to stand 10 seconds with supervision    Standing Ubsupported with Feet Together  Able to place feet together independently and stand for 1 minute with supervision    From Standing, Reach Forward with Outstretched Arm  Can reach confidently >25 cm (10")    From Standing Position, Pick up Object from Floor  Able to pick up shoe, needs supervision    From Standing Position, Turn to Look Behind Over each Shoulder  Looks behind one side only/other side shows less weight shift    Turn 360 Degrees  Able to turn 360 degrees safely but slowly > 6 sec's both ways    Standing Unsupported, Alternately Place Feet on Step/Stool  Able to complete 4 steps without aid or supervision needs supervision, 24.44 sec's    Standing Unsupported, One Foot in Front  Able to plae foot ahead of the other independently and hold 30 seconds    Standing on One Leg  Tries to lift leg/unable to hold 3 seconds but remains standing independently    Total Score  44      Timed Up and Go Test   Normal TUG (seconds)  11.91 with cane    Cognitive TUG (seconds)  26.53 with cane, foods A-Z    TUG Comments  over 100% increase in time between normal and cognitive. pt needed constant cues to task with cognitive as well.        issued the following to pt's HEP today with min  guard assist for balance. Cues needed for correct ex form/technique.  Functional Quadriceps: Sit to Stand    Sit on edge of chair, feet flat on floor. Stand fully upright, straightening knees, then sit back down slowly. Use arms as needed.  Repeat _10_ times per set. Do _1_ sets per session. Do _1-2_ sessions per day.  http://orth.exer.us/734   Copyright  VHI. All rights reserved.  Holding onto something sturdy for balance assistance.  Feet Together, Varied Arm Positions - Eyes Closed    Stand with feet together and arms as needed for balance. Close eyes and visualize upright position. Hold _30_ seconds. Repeat _3_ times per session. Do _1-2_ sessions per day.  Copyright  VHI. All rights reserved.    Single Leg - Eyes Open    Holding support, lift right leg while maintaining balance over left leg. Progress to removing hands from support surface for longer periods of time. Hold__10__ seconds.  Switch leg: lift left leg while maintaining balance over right leg. Hold for 10 seconds. Progress to removing hands from support surface as able.   Repeat _3_ times on each leg per session. Do _1-2_ sessions per day.  Copyright  VHI. All rights reserved.        PT Education - 10/03/17 1014    Education provided  Yes    Education Details  results of TUG's, Berg balance test. Recommendation for RW in community at this time due to fall risk per Berg score; HEP to address strengthening and balance.    Person(s) Educated  Patient;Child(ren) daughter    Methods  Explanation;Demonstration       PT Short Term Goals - 09/18/17 1934      PT SHORT TERM GOAL #1   Title  Patient will complete HEP with supervision of family to assure proper technique. (Target for all STGs 11/01/2017--date reflects 4 weeks after 1st treatment 10/03/17)    Time  4    Period  Weeks    Status  New    Target Date  11/01/17      PT SHORT TERM GOAL #2   Title  Patient will improve TUG normal to <13.5  seconds with SPC to demonstrate lesser fall risk.     Time  4    Period  Weeks    Status  New      PT SHORT TERM GOAL #3   Title  Patient will improve Berg score >=44/56 demonstrating adequate balance for use of cane indoors.     Time  4    Period  Weeks    Status  New      PT SHORT TERM GOAL #4   Title  Patient can verbalize signs/symptoms of CVA, appropriate actions to take, and understanding of need to get to hospital within 3 hours of onset of symptoms    Time  4    Period  Weeks        PT Long Term Goals - 09/18/17 1946      PT LONG TERM GOAL #1   Title  Patient will be independent with HEP (using handouts) for balance and strengthening. (Target for all LTG's 12/02/2017--based on late start date)    Time  8    Period  Weeks    Status  New    Target Date  12/02/17      PT LONG TERM GOAL #2   Title  Patient will improve gait velocity to >=2.62 ft/sec with least restrictive assistive device (indicative of velocity for safe community ambulation)    Time  8    Period  Weeks    Status  New      PT LONG TERM GOAL #3   Title  Patient will improve Berg to >=47/56 indicative of safe use of cane outdoors.     Time  8    Period  Weeks    Status  New  PT LONG TERM GOAL #4   Title  Patient able to ambulate >= 200 ft outdoors with cane modified independent over unlevel ground to allow safe walking to his bird feeders in his back yard.     Time  8    Period  Weeks    Status  New         Plan - 10/03/17 0941    Clinical Impression Statement  Today's skilled session initially focused on setting baselines for the TUG, cognitive TUG and Berg Balance test. The scores on the cognitive TUG and Berg Balance test both place pt in a high fall risk category. Discussed with pt and daughter that based on these results he would be better to use the RW in the community at this time, not the cane, for decreased fall risk (pt had graduated himself to the cane full time). Pt and daugther  verbalized understanding. Remainder of session focused on issuance of a brief HEP. Will plan to add to it next session. Pt is progressing toward goals and should benefit from continued PT to progress toward unmet goals.                          Rehab Potential  Good    Clinical Impairments Affecting Rehab Potential  - Multiple co-morbidities; + very motivated    PT Frequency  2x / week    PT Duration  8 weeks    PT Treatment/Interventions  ADLs/Self Care Home Management;Aquatic Therapy;Electrical Stimulation;Gait training;DME Instruction;Stair training;Functional mobility training;Therapeutic activities;Therapeutic exercise;Balance training;Neuromuscular re-education;Cognitive remediation;Manual techniques;Orthotic Fit/Training;Patient/family education;Passive range of motion    PT Next Visit Plan  add additional balance components to HEP; continue to address strengthening and balance.    PT Home Exercise Plan  reports no HEP given on CIR, wife confirms    Consulted and Agree with Plan of Care  Patient;Family member/caregiver    Family Member Consulted  wife       Patient will benefit from skilled therapeutic intervention in order to improve the following deficits and impairments:  Abnormal gait, Decreased activity tolerance, Decreased balance, Decreased cognition, Decreased mobility, Decreased knowledge of use of DME, Decreased strength, Increased edema, Impaired sensation, Postural dysfunction, Impaired UE functional use, Obesity  Visit Diagnosis: Other abnormalities of gait and mobility  Muscle weakness (generalized)  Abnormal posture  Other symptoms and signs involving the nervous system     Problem List Patient Active Problem List   Diagnosis Date Noted  . CKD (chronic kidney disease) stage 4, GFR 15-29 ml/min (HCC) 09/26/2017  . 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 Diastolic dysfunction   . Coronary artery disease involving native  coronary artery of native heart without angina pectoris   . Cardiac pacemaker in situ   . Atrial fibrillation with rapid ventricular response ([place])   . PVD (peripheral vascular disease) ([place])   . Lower extremity edema 04/21/2017  . Anxiety 03/22/2017  . Acute cholecystitis s/p lap cholecystectomy 03/05/2017 03/04/2017  . HOCM (hypertrophic obstructive cardiomyopathy) ([place]) 03/04/2017  . 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 Fatigue 01/27/2017  . Low vitamin B12 level 01/27/2017  . Syncope 07/24/2015  . 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 OSA on CPAP 10/26/2014  . Back pain 10/16/2014  . Seasonal and perennial allergic rhinitis 10/25/2013  . Hypertension associated with diabetes ([place]) 10/25/2013  . Hyperlipidemia associated with type  2 diabetes mellitus ([place]) 10/25/2013  . Morbid obesity ([place]) 10/25/2013    Willow Ora, PTA, [facility] [address], [place],  [address] [phone] 10/04/17, 1:44 PM   Name: David [name] MRN: [id number] Date of Birth: 03/21/1941

## 2017-10-05 ENCOUNTER — Other Ambulatory Visit: Payer: Self-pay

## 2017-10-05 ENCOUNTER — Ambulatory Visit: Payer: Medicare Other | Admitting: Speech Pathology

## 2017-10-05 DIAGNOSIS — I69315 Cognitive social or emotional deficit following cerebral infarction: Secondary | ICD-10-CM | POA: Diagnosis not present

## 2017-10-05 DIAGNOSIS — R41841 Cognitive communication deficit: Secondary | ICD-10-CM

## 2017-10-05 DIAGNOSIS — R2689 Other abnormalities of gait and mobility: Secondary | ICD-10-CM | POA: Diagnosis not present

## 2017-10-05 DIAGNOSIS — R293 Abnormal posture: Secondary | ICD-10-CM | POA: Diagnosis not present

## 2017-10-05 DIAGNOSIS — R29818 Other symptoms and signs involving the nervous system: Secondary | ICD-10-CM | POA: Diagnosis not present

## 2017-10-05 DIAGNOSIS — M6281 Muscle weakness (generalized): Secondary | ICD-10-CM | POA: Diagnosis not present

## 2017-10-05 NOTE — Patient Instructions (Signed)
Memory Compensation Strategies  1. Use "WARM" strategy. W= write it down A=  associate it R=  repeat it M=  make a mental picture  2. You can keep a Memory Notebook. Use a 3-ring notebook with sections for the following:  calendar, important names and phone numbers, medications, doctors' names/phone numbers, "to do list"/reminders, and a section to journal what you did each day  3. Use a calendar to write appointments down.  4. Write yourself a schedule for the day.  This can be placed on the calendar or in a separate section of the Memory Notebook.  Keeping a regular schedule can help memory.  5. Use medication organizer with sections for each day or morning/evening pills  You may need help loading it  6. Keep a basket, or pegboard by the door.   Place items that you need to take out with you in the basket or on the pegboard.  You may also want to include a message board for reminders.  7. Use sticky notes. Place sticky notes with reminders in a place where the task is performed.  For example:  "turn off the stove" placed by the stove, "lock the door" placed on the door at eye level, "take your medications" on the bathroom mirror or by the place where you normally take your medications  8. Use alarms/timers.  Use while cooking to remind yourself to check on food or as a reminder to take your medicine, or as a reminder to make a call, or as a reminder to perform another task, etc.  9. Use a small tape recorder to record important information and notes for yourself.  --------------------------------------------------------------------------------------------------------------------------------------------------------------------   

## 2017-10-06 ENCOUNTER — Other Ambulatory Visit: Payer: Self-pay | Admitting: Physical Medicine and Rehabilitation

## 2017-10-06 DIAGNOSIS — H6123 Impacted cerumen, bilateral: Secondary | ICD-10-CM | POA: Diagnosis not present

## 2017-10-06 NOTE — Therapy (Signed)
[facility] [address] [place], [name], [address] Phone: [phone]   Fax:  [phone]  Speech Language Pathology Evaluation  Patient Details  Name: David [name] MRN: [id number] Date of Birth: Apr 12, 1941 Referring Provider: Dr. Naaman Plummer   Encounter Date: 10/05/2017  End of Session - 10/05/17 1445    Visit Number  1    Number of Visits  17    Date for SLP Re-Evaluation  12/22/17 extended re-eval date; first treatment at end of June    SLP Start Time  1445    SLP Stop Time   1530    SLP Time Calculation (min)  45 min    Activity Tolerance  Patient tolerated treatment well       Past Medical History:  Diagnosis Date  . Anemia, iron deficiency   . Anxiety   . Arthritis   . BPH (benign prostatic hypertrophy)   . CAD (coronary artery disease)    Nonobstructive CAD per cath  . Cardiac pacemaker in situ   . CHF (congestive heart failure) ([facility])   . Chronic ulcer of right foot ([place])   . CKD (chronic kidney disease), stage III ([place]) secondary to DM and HTN   nephrologist-  [name]  . Dyspnea   . History of cellulitis    right great toe 10-25-2014  . History of skin cancer   . HOCM (hypertrophic obstructive cardiomyopathy) ([place])   . Hypertension   . Insulin dependent type 2 diabetes mellitus ([facility]) 1991   followd by [name]--  has insulin pump  . Insulin pump in place   . OSA on CPAP   . Peripheral neuropathy    severe  . Peripheral vascular disease ([place])    bilateral lower extremities  . Rib fracture 07/24/2015  . Secondary hyperparathyroidism of renal origin ([place])   . Sinus node dysfunction ([facility])   . Sleep apnea     Past Surgical History:  Procedure Laterality Date  . AMPUTATION OF REPLICATED TOES  Mar 4166   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)  . AMPUTATION TOE Right 03/12/2015   Procedure: RIGHT HALLUS AMPUTATION ;  Surgeon: [name], MD;  Location: [facility];  Service: Podiatry;   Laterality: Right;  . CARDIAC CATHETERIZATION  11-25-2010   [place], [place]   Nonobstructive CAD  . CARDIAC PACEMAKER PLACEMENT  Nov 2009   [employer]  . CHOLECYSTECTOMY N/A 03/05/2017   Procedure: LAPAROSCOPIC CHOLECYSTECTOMY WITH INTRAOPERATIVE CHOLANGIOGRAM;  Surgeon: [name], MD;  Location: [facility];  Service: General;  Laterality: N/A;  . EP IMPLANTABLE DEVICE N/A 06/03/2015   Procedure: PPM Generator Changeout;  Surgeon: [name], MD;  Location: [facility];  Service: Cardiovascular;  Laterality: N/A;  . EXCISION BONE CYST Right 03/06/2015   Procedure: BONE BIOPSIES OF RIGHT FOOT;  Surgeon: [name], MD;  Location: [facility];  Service: Podiatry;  Laterality: Right;  . ORIF ANKLE FRACTURE Left 11/06/2014   Procedure: OPEN REDUCTION INTERNAL FIXATION (ORIF) LEFT  ANKLE FRACTURE;  Surgeon: [name], MD;  Location: [facility];  Service: Orthopedics;  Laterality: Left;  . TEE WITHOUT CARDIOVERSION N/A 09/01/2017   Procedure: TRANSESOPHAGEAL ECHOCARDIOGRAM (TEE);  Surgeon: [name], MD;  Location: [facility];  Service: Cardiovascular;  Laterality: N/A;  . TOTAL KNEE ARTHROPLASTY    . VEIN LIGATION AND STRIPPING      There were no vitals filed for this visit.  Subjective Assessment - 10/05/17 1449  Subjective  "Well, my balance is off" pt tells SLP physical deficits in response to: "Have you noticed any changes in your thinking?"    Patient is accompained by:  Family member son Graciano, wife [name]    Currently in Pain?  No/denies         SLP Evaluation [facility] - 10/05/17 1445      SLP Visit Information   SLP Received On  10/05/17    Referring Provider  Dr. Naaman Plummer    Onset Date  08/31/17    Medical Diagnosis  CVA      Subjective   Subjective  arrives with wife and son    Patient/Family Stated Goal  improve short term memory (wife)      Pain Assessment   Currently in Pain?  No/denies      General Information   HPI  77  yo man with DM, HTN, CKD3, admitted with R MCA dist CVA 08/31/17. On 09/02/17 patient experienced dizziness and had a syncopal episode while returning from the bathroom, was resuscitated with 1 dose of atropine and 1 minute of chest compressions. MRI 08/31/17 revealed small areas of restricted diffusion in the right frontal cortex posteriorly compatible with acute infarct.     Behavioral/Cognition  alert, cooperative    Mobility Status  ambulated to session      Balance Screen   Has the patient fallen in the past 6 months  No    Has the patient had a decrease in activity level because of a fear of falling?   No      Prior Functional Status   Cognitive/Linguistic Baseline  Baseline deficits    Baseline deficit details  pt reports mild memory issues    Type of Home  House     Lives With  Spouse    Available Support  Family    Vocation  Retired      Associate Professor   Overall Cognitive Status  Impaired/Different from baseline    Area of Impairment  Attention;Memory;Awareness    Current Attention Level  Sustained    Memory  Decreased short-term memory    Attention  Selective    Selective Attention  Impaired    Selective Attention Impairment  Functional basic distracted in busy environments; symbol cancellation error    Memory  Impaired    Memory Impairment  Decreased short term memory;Storage deficit    Decreased Short Term Memory  Verbal basic;Functional basic    Awareness  Impaired    Awareness Impairment  Emergent impairment;Anticipatory impairment    Problem Solving  Impaired    Problem Solving Impairment  Verbal complex;Functional complex    Executive Function  Reasoning;Self Monitoring    Reasoning  Impaired    Reasoning Impairment  Verbal complex;Functional complex    Self Monitoring  Impaired    Self Monitoring Impairment  Verbal basic;Functional basic      Auditory Comprehension   Overall Auditory Comprehension  Appears within functional limits for tasks assessed    Yes/No Questions   Within Functional Limits    Commands  Within Functional Limits    Interfering Components  Attention;Processing speed    Overall Auditory Comprehension Comments  story recall impaired, comprehension in mod complex conversation impaired; attention/memory/processing speed vs language      Visual Recognition/Discrimination   Discrimination  Within Function Limits      Reading Comprehension   Reading Status  Not tested      Expression   Primary Mode of Expression  Verbal  Verbal Expression   Overall Verbal Expression  Appears within functional limits for tasks assessed    Naming  Impairment    Confrontation  75-100% accurate 100%    Divergent  Other (comment) pt forgot naming /m/ words vs animals, several perseveration    Pragmatics  No impairment    Interfering Components  Attention      Written Expression   Dominant Hand  Right    Written Expression  Not tested      Oral Motor/Sensory Function   Overall Oral Motor/Sensory Function  Appears within functional limits for tasks assessed      Motor Speech   Overall Motor Speech  Appears within functional limits for tasks assessed      Standardized Assessments   Standardized Assessments   Cognitive Linguistic Quick Test      Cognitive Linguistic Quick Test (Ages 18-69)   Attention  Mild    Memory  Mild    Executive Function  WNL    Language  Moderate    Visuospatial Skills  WNL    Severity Rating Total  16    Composite Severity Rating  12.8      Individuals Consulted   Consulted and Agree with Results and Recommendations  Patient;Family member/caregiver                      SLP Education - 10/06/17 [id number]    Education provided  Yes    Education Details  results of cognitive testing, proposed treatment plan for SLP, memory strategies    Person(s) Educated  Patient;Spouse;Child(ren)    Methods  Explanation;Handout    Comprehension  Verbalized understanding       SLP Short Term Goals - 10/05/17 1445       SLP SHORT TERM GOAL #1   Title  Pt will selectively attend to mod complex cognitive linguistic task for 10 minutes with distractions and occasional min redirection over 3 sessions    Time  4    Period  Weeks    Status  New      SLP SHORT TERM GOAL #2   Title  Pt will complete mod complex organization, reasoning, problem solving tasks with 85% accuracy and occasional min A over 3 sessions    Time  4    Period  Weeks    Status  New      SLP SHORT TERM GOAL #3   Title  Pt will ID errors and self correct 3/5 errors with occasional min A over 3 sessions    Time  4    Period  Weeks    Status  New      SLP SHORT TERM GOAL #4   Title  Pt will utilize external aids for medication, schedule management with occasional min A from family outside of therapy over 2 sessions    Time  4    Period  Weeks    Status  New       SLP Long Term Goals - 10/05/17 1445      SLP LONG TERM GOAL #1   Title  Pt will alternate attention between 2 mod complex cognitive linguistic tasks for 15 minutes with 85% on each and rare min A over 3 sessions    Time  8    Period  Weeks    Status  New      SLP LONG TERM GOAL #2   Title  Pt will self correct errors 4/5 errors with occasional  min A over 3 sessions    Time  8    Period  Weeks    Status  New      SLP LONG TERM GOAL #3   Title  Pt will utilize external aids for medication, schedule management with rare min A from family outside of therapy over 4 sessions    Time  8    Period  Weeks    Status  New       Plan - 10/05/17 1445    Clinical Impression Statement  Mr. Houseworth presents with overall mild cognitive impairments. Deficits seen today in higher level attention, short term recall, problem solving, planning, and emergent awareness. Pt scored within mild range of severity in attention and memory, and moderate range for language. Pt's language score significantly impacted by story recall and generative naming tasks. SLP suspects performance in  these tasks is more impacted by pt's impaired attention and recall vs. language. Pt's scores for executive functions and visuospatial skills fell WNL per CLQT scoring for his age, however he had significant difficulty with mazes and design generation task (only 1 correct design). Cues required for error awareness in all tasks, including clock drawing. Pt denies deficits initially, however when confronted with errors he does acknowledge difficulties. Pt's wife and son report pt asking the same question several times, minutes apart. They are supervising him with medications as they realized he was making errors. I recommend skilled ST to address cognitive communication impairments in order to increase independence, decrease caregiver burden and improve quality of life.     Speech Therapy Frequency  2x / week    Duration  -- 8 weeks or 16 additional visits    Treatment/Interventions  Cognitive reorganization;Compensatory strategies;Language facilitation;Compensatory techniques;Cueing hierarchy;Internal/external aids;Functional tasks;SLP instruction and feedback;Patient/family education;Environmental controls    Potential to Achieve Goals  Good    SLP Home Exercise Plan  memory strategies provided; pt to obtain/use a calendar or daily schedule    Consulted and Agree with Plan of Care  Patient;Family member/caregiver    Family Member Consulted  son and wifeMr. Elizondo presents with overall mild cognitive impairments. Deficits seen today in higher level attention, short term recall, problem solving, planning, and emergent awareness. Pt scored within mild range of severity in attention and memory, and moderate range for language. Pt's language score significantly impacted by story recall and generative naming tasks. SLP suspects performance in these tasks is more impacted by pt's impaired attention and recall vs. language. Pt's scores for executive functions and visuospatial skills fell WNL per CLQT scoring for his  age, however he had significant difficulty with mazes and design generation task (only 1 correct design). Cues required for error awareness in all tasks, including clock drawing. Pt denies deficits initially, however when confronted with errors he does acknowledge difficulties. Pt's wife and son report pt asking the same question several times, minutes apart. They are supervising him with medications as they realized he was making errors. I recommend skilled ST to address cognitive communication impairments in order to increase independence, decrease caregiver burden and improve quality of life.        Patient will benefit from skilled therapeutic intervention in order to improve the following deficits and impairments:   Cognitive communication deficit    Problem List Patient Active Problem List   Diagnosis Date Noted  . CKD (chronic kidney disease) stage 4, GFR 15-29 ml/min ([facility]) 09/26/2017  . 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 Diastolic dysfunction   . Coronary artery disease involving native coronary artery of native heart without angina pectoris   . Cardiac pacemaker in situ   . Atrial fibrillation with rapid ventricular response ([place])   . PVD (peripheral vascular disease) ([facility])   . Lower extremity edema 04/21/2017  . Anxiety 03/22/2017  . Acute cholecystitis s/p lap cholecystectomy 03/05/2017 03/04/2017  . HOCM (hypertrophic obstructive cardiomyopathy) ([facility]) 03/04/2017  . 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 Fatigue 01/27/2017  . Low vitamin B12 level 01/27/2017  . Syncope 07/24/2015  . 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 OSA on CPAP 10/26/2014  . Back pain 10/16/2014  . Seasonal and perennial allergic rhinitis 10/25/2013  . Hypertension associated with diabetes ([place]) 10/25/2013  . Hyperlipidemia associated with type 2 diabetes mellitus ([place]) 10/25/2013  . Morbid  obesity ([place]) 10/25/2013   Deneise Lever, [place], [place] 10/06/2017, 8:29 AM  [facility] [address] [place], [name], [address] Phone: [phone]   Fax:  [phone]  Name: FINNEAN CERAMI MRN: [id number] Date of Birth: 18-Sep-1940

## 2017-10-10 ENCOUNTER — Ambulatory Visit: Payer: Medicare Other | Admitting: Physical Therapy

## 2017-10-10 ENCOUNTER — Encounter: Payer: Self-pay | Admitting: Physical Therapy

## 2017-10-10 ENCOUNTER — Ambulatory Visit: Payer: Medicare Other | Admitting: Occupational Therapy

## 2017-10-10 DIAGNOSIS — R2689 Other abnormalities of gait and mobility: Secondary | ICD-10-CM | POA: Diagnosis not present

## 2017-10-10 DIAGNOSIS — M6281 Muscle weakness (generalized): Secondary | ICD-10-CM

## 2017-10-10 DIAGNOSIS — R293 Abnormal posture: Secondary | ICD-10-CM | POA: Diagnosis not present

## 2017-10-10 DIAGNOSIS — I69315 Cognitive social or emotional deficit following cerebral infarction: Secondary | ICD-10-CM

## 2017-10-10 DIAGNOSIS — R41841 Cognitive communication deficit: Secondary | ICD-10-CM | POA: Diagnosis not present

## 2017-10-10 DIAGNOSIS — R29818 Other symptoms and signs involving the nervous system: Secondary | ICD-10-CM

## 2017-10-10 NOTE — Therapy (Signed)
[facility] [address] [place], [name], [address] Phone: [phone]   Fax:  [phone]  Physical Therapy Treatment  Patient Details  Name: David [name] MRN: [id number] Date of Birth: December 14, 1940 Referring Provider: Dr. Naaman Plummer   Encounter Date: 10/10/2017  PT End of Session - 10/10/17 2102    Visit Number  3    Number of Visits  17    Date for PT Re-Evaluation  12/17/17    Authorization Type  Medicare; [employer]    Authorization Time Period  09/18/17 to 12/17/17    PT Start Time  1018    PT Stop Time  1100    PT Time Calculation (min)  42 min    Equipment Utilized During Treatment  --    Activity Tolerance  Patient tolerated treatment well    Behavior During Therapy  [facility] for tasks assessed/performed       Past Medical History:  Diagnosis Date  . Anemia, iron deficiency   . Anxiety   . Arthritis   . BPH (benign prostatic hypertrophy)   . CAD (coronary artery disease)    Nonobstructive CAD per cath  . Cardiac pacemaker in situ   . CHF (congestive heart failure) ([place])   . Chronic ulcer of right foot ([place])   . CKD (chronic kidney disease), stage III ([place]) secondary to DM and HTN   nephrologist-  [name]  . Dyspnea   . History of cellulitis    right great toe 10-25-2014  . History of skin cancer   . HOCM (hypertrophic obstructive cardiomyopathy) ([place])   . Hypertension   . Insulin dependent type 2 diabetes mellitus ([place]) 1991   followd by [name]--  has insulin pump  . Insulin pump in place   . OSA on CPAP   . Peripheral neuropathy    severe  . Peripheral vascular disease ([place])    bilateral lower extremities  . Rib fracture 07/24/2015  . Secondary hyperparathyroidism of renal origin ([place])   . Sinus node dysfunction (HCC)   . Sleep apnea     Past Surgical History:  Procedure Laterality Date  . AMPUTATION OF REPLICATED TOES  Mar 9622   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)  . AMPUTATION TOE  Right 03/12/2015   Procedure: RIGHT HALLUS AMPUTATION ;  Surgeon: [name], MD;  Location: [facility];  Service: Podiatry;  Laterality: Right;  . CARDIAC CATHETERIZATION  11-25-2010   [place], [place]   Nonobstructive CAD  . CARDIAC PACEMAKER PLACEMENT  Nov 2009   [employer]  . CHOLECYSTECTOMY N/A 03/05/2017   Procedure: LAPAROSCOPIC CHOLECYSTECTOMY WITH INTRAOPERATIVE CHOLANGIOGRAM;  Surgeon: [name], MD;  [facility];  Service: General;  Laterality: N/A;  . EP IMPLANTABLE DEVICE N/A 06/03/2015   Procedure: PPM Generator Changeout;  Surgeon: [name], MD;  Location: [facility];  Service: Cardiovascular;  Laterality: N/A;  . EXCISION BONE CYST Right 03/06/2015   Procedure: BONE BIOPSIES OF RIGHT FOOT;  Surgeon: [name], MD;  Location: [facility];  Service: Podiatry;  Laterality: Right;  . ORIF ANKLE FRACTURE Left 11/06/2014   Procedure: OPEN REDUCTION INTERNAL FIXATION (ORIF) LEFT  ANKLE FRACTURE;  Surgeon: [name], MD;  Location: [facility];  Service: Orthopedics;  Laterality: Left;  . TEE WITHOUT CARDIOVERSION N/A 09/01/2017   Procedure: TRANSESOPHAGEAL ECHOCARDIOGRAM (TEE);  Surgeon: [name], MD;  Location: [facility]OS[facility];  Service: Cardiovascular;  Laterality: N/A;  . TOTAL  KNEE ARTHROPLASTY    . VEIN LIGATION AND STRIPPING      There were no vitals filed for this visit.  Subjective Assessment - 10/10/17 1018    Subjective  No new complaints. Got his ears cleaned last week and balance has been better. Asking about when he will be able to drive again (deferred to speak with OT)    Pertinent History  HTN, morbid obesity, CKD, cardiomyopathy, PPM, macular degeneration, CAD, cardiac arrest in hospital, DM, diabetic neuropathy; PVD, Rt 1-2 toe amputations, partial rt 3rd toe amputation, lt ankle ORIF 6/16    Limitations  Walking    How long can you walk comfortably?  about 10 minutes    Patient Stated Goals   get back to using my cane; feeding the birds    Currently in Pain?  No/denies                       [facility] Adult PT Treatment/Exercise - 10/10/17 2055      Ambulation/Gait   Ambulation Distance (Feet)  120 Feet x2; plus throughout gym    Assistive device  Straight cane    Gait Pattern  Step-through pattern;Decreased step length - right;Decreased stance time - left;Wide base of support    Ambulation Surface  Indoor          Balance Exercises - 10/10/17 2057      Balance Exercises: Standing   Standing Eyes Opened  Narrow base of support (BOS);Wide (BOA);Head turns;Solid surface    Standing Eyes Closed  Narrow base of support (BOS);Wide (BOA);Head turns;Solid surface    SLS  Eyes open;Upper extremity support 1    Tandem Gait  Forward;Upper extremity support    Retro Gait  Upper extremity support    Sidestepping  Foam/compliant support    Marching Limitations  slow marching    Heel Raises Limitations  bil x 10 light UE support    Toe Raise Limitations  bil x 10     6" hurdles on blue mat with single UE support on counter fwds, sideways   PT Education - 10/10/17 2100    Education Details  see additions to HEp; components of balance systems    Person(s) Educated  Patient;Spouse    Methods  Explanation;Demonstration;Verbal cues;Handout    Comprehension  Verbalized understanding;Returned demonstration;Verbal cues required;Need further instruction       PT Short Term Goals - 09/18/17 1934      PT SHORT TERM GOAL #1   Title  Patient will complete HEP with supervision of family to assure proper technique. (Target for all STGs 11/01/2017--date reflects 4 weeks after 1st treatment 10/03/17)    Time  4    Period  Weeks    Status  New    Target Date  11/01/17      PT SHORT TERM GOAL #2   Title  Patient will improve TUG normal to <13.5 seconds with SPC to demonstrate lesser fall risk.     Time  4    Period  Weeks    Status  New      PT SHORT TERM GOAL #3   Title   Patient will improve Berg score >=44/56 demonstrating adequate balance for use of cane indoors.     Time  4    Period  Weeks    Status  New      PT SHORT TERM GOAL #4   Title  Patient can verbalize signs/symptoms of CVA, appropriate actions to  take, and understanding of need to get to hospital within 3 hours of onset of symptoms    Time  4    Period  Weeks        PT Long Term Goals - 09/18/17 1946      PT LONG TERM GOAL #1   Title  Patient will be independent with HEP (using handouts) for balance and strengthening. (Target for all LTG's 12/02/2017--based on late start date)    Time  8    Period  Weeks    Status  New    Target Date  12/02/17      PT LONG TERM GOAL #2   Title  Patient will improve gait velocity to >=2.62 ft/sec with least restrictive assistive device (indicative of velocity for safe community ambulation)    Time  8    Period  Weeks    Status  New      PT LONG TERM GOAL #3   Title  Patient will improve Berg to >=47/56 indicative of safe use of cane outdoors.     Time  8    Period  Weeks    Status  New      PT LONG TERM GOAL #4   Title  Patient able to ambulate >= 200 ft outdoors with cane modified independent over unlevel ground to allow safe walking to his bird feeders in his back yard.     Time  8    Period  Weeks    Status  New            Plan - 10/10/17 2103    Clinical Impression Statement  Session focused on balance training and gait training. Patient arrived only using SPC despite clear instructions at last visit to use RW for improved safety. Wife reports he does use his RW if going somewhere unfamiliar, however using his cane inside home (and reports he does forget to use cane at times). Patient continues with impaired balance especially with single leg stance or narrow base of support. Due to his neuropathy, he is especially challenged with activities with eyes closed and noted to have decreased function of vestibular system to compensate  (?related to his hearing loss). Patient can continue to benefit from PT to improve his balance and reduce his fall risk.     Rehab Potential  Good    Clinical Impairments Affecting Rehab Potential  - Multiple co-morbidities; + very motivated    PT Frequency  2x / week    PT Duration  8 weeks    PT Treatment/Interventions  ADLs/Self Care Home Management;Aquatic Therapy;Electrical Stimulation;Gait training;DME Instruction;Stair training;Functional mobility training;Therapeutic activities;Therapeutic exercise;Balance training;Neuromuscular re-education;Cognitive remediation;Manual techniques;Orthotic Fit/Training;Patient/family education;Passive range of motion    PT Next Visit Plan  check understanding of HEP added 5/28; continue to address strengthening and balance, including need for RW     PT Home Exercise Plan  reports no HEP given on CIR, wife confirms    Consulted and Agree with Plan of Care  Patient;Family member/caregiver    Family Member Consulted  wife       Patient will benefit from skilled therapeutic intervention in order to improve the following deficits and impairments:  Abnormal gait, Decreased activity tolerance, Decreased balance, Decreased cognition, Decreased mobility, Decreased knowledge of use of DME, Decreased strength, Increased edema, Impaired sensation, Postural dysfunction, Impaired UE functional use, Obesity  Visit Diagnosis: Muscle weakness (generalized)  Other abnormalities of gait and mobility  Other symptoms and signs involving the nervous system  Problem List Patient Active Problem List   Diagnosis Date Noted  . CKD (chronic kidney disease) stage 4, GFR 15-29 ml/min (HCC) 09/26/2017  . 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 Diastolic dysfunction   . Coronary artery disease involving native coronary artery of native heart without angina pectoris   . Cardiac pacemaker in situ   . Atrial fibrillation with  rapid ventricular response ([place])   . PVD (peripheral vascular disease) ([place])   . Lower extremity edema 04/21/2017  . Anxiety 03/22/2017  . Acute cholecystitis s/p lap cholecystectomy 03/05/2017 03/04/2017  . HOCM (hypertrophic obstructive cardiomyopathy) ([place]) 03/04/2017  . 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 Fatigue 01/27/2017  . Low vitamin B12 level 01/27/2017  . Syncope 07/24/2015  . 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 OSA on CPAP 10/26/2014  . Back pain 10/16/2014  . Seasonal and perennial allergic rhinitis 10/25/2013  . Hypertension associated with diabetes ([place]) 10/25/2013  . Hyperlipidemia associated with type 2 diabetes mellitus ([place]) 10/25/2013  . Morbid obesity ([place]) 10/25/2013    Rexanne Mano, PT 10/10/2017, 9:09 PM  [facility] [address] [place], [name], [address] Phone: [phone]   Fax:  [phone]  Name: DARYAN BUELL MRN: [id number] Date of Birth: July 26, 1940

## 2017-10-10 NOTE — Patient Instructions (Addendum)
Memory Compensation Strategies  1. Use "WARM" strategy.  W= write it down  A= associate it  R= repeat it  M= make a mental note  2.   You can keep a Social worker.  Use a 3-ring notebook with sections for the following: calendar, important names and phone numbers,  medications, doctors' names/phone numbers, lists/reminders, and a section to journal what you did  each day.   3.    Use a calendar to write appointments down.  4.    Write yourself a schedule for the day.  This can be placed on the calendar or in a separate section of the Memory Notebook.  Keeping a  regular schedule can help memory.  5.    Use medication organizer with sections for each day or morning/evening pills.  You may need help loading it  6.    Keep a basket, or pegboard by the door.  Place items that you need to take out with you in the basket or on the pegboard.  You may also want to  include a message board for reminders.  7.    Use sticky notes.  Place sticky notes with reminders in a place where the task is performed.  For example: " turn off the  stove" placed by the stove, "lock the door" placed on the door at eye level, " take your medications" on  the bathroom mirror or by the place where you normally take your medications.  8.    Use alarms/timers.  Use while cooking to remind yourself to check on food or as a reminder to take your medicine, or as a  reminder to make a call, or as a reminder to perform another task, etc.            Hold onto a chair back for the standing exercises     Strengthening: Resisted Flexion   Hold tubing with _left____ arm(s) at side. Pull forward and up. Move shoulder through pain-free range of motion. Repeat __10__ times per set.  Do _1-2_ sessions per day , every other day   Strengthening: Resisted Extension   Hold tubing in __left___ hand(s), arm forward. Pull arm back, elbow straight. Repeat _10___ times per set. Do _1-2___ sessions per day, every  other day.   Resisted Horizontal Abduction: Bilateral   Sit or stand, tubing in both hands, arms out in front. Keeping arms straight, pinch shoulder blades together and stretch arms out.Sit for exercise Repeat _10___ times per set. Do _1-2___ sessions per day, every other day.   Elbow Flexion: Resisted   With tubing held in __left____ hand(s) and other end secured under foot, curl arm up as far as possible. Repeat _10___ times per set. Do _1-2___ sessions per day, every other day.    Elbow Extension: Resisted   Sit in chair with resistive band secured at armrest (or hold with other hand) and _left______ elbow bent. Straighten elbow. Repeat _10___ times per set.  Do _1-2___ sessions per day, every other day.   Copyright  VHI. All rights reserved.

## 2017-10-10 NOTE — Patient Instructions (Signed)
Toe Stand    Lightly use arms for support, stand on toes __3_ seconds and slowly lower.  Repeat __10__ times. Do _2___ sessions per day.   Backward    Beside your counter, walk backwards with eyes open. Take even steps, making sure each foot lifts off floor. Repeat for __3__ minutes per session. Do __1__ sessions per day.   Feet Together, Head Motion - Eyes Closed    Standing with back to the corner, eyes closed and feet together, hold for 30 seconds. Then move head slowly left and right x 10 reps. Then you can also try up and down x 10.  Do _1___ sessions per day.  Copyright  VHI. All rights reserved.

## 2017-10-11 NOTE — Therapy (Signed)
[facility] [address] [place], [name], [address] Phone: [phone]   Fax:  [phone]  Occupational Therapy Treatment  Patient Details  Name: David [name] MRN: [id number] Date of Birth: June 03, 1940 Referring Provider: Dr. Naaman Plummer   Encounter Date: 10/10/2017  OT End of Session - 10/10/17 0952    Visit Number  3    Number of Visits  17    Date for OT Re-Evaluation  11/18/17    Authorization Type  Medicare/ [employer]    OT Start Time  [phone]    OT Stop Time  1015    OT Time Calculation (min)  39 min    Activity Tolerance  Patient tolerated treatment well    Behavior During Therapy  [facility] for tasks assessed/performed       Past Medical History:  Diagnosis Date  . Anemia, iron deficiency   . Anxiety   . Arthritis   . BPH (benign prostatic hypertrophy)   . CAD (coronary artery disease)    Nonobstructive CAD per cath  . Cardiac pacemaker in situ   . CHF (congestive heart failure) ([place])   . Chronic ulcer of right foot ([place])   . CKD (chronic kidney disease), stage III ([place]) secondary to DM and HTN   nephrologist-  [name]  . Dyspnea   . History of cellulitis    right great toe 10-25-2014  . History of skin cancer   . HOCM (hypertrophic obstructive cardiomyopathy) ([place])   . Hypertension   . Insulin dependent type 2 diabetes mellitus ([place]) 1991   followd by [name]--  has insulin pump  . Insulin pump in place   . OSA on CPAP   . Peripheral neuropathy    severe  . Peripheral vascular disease ([place])    bilateral lower extremities  . Rib fracture 07/24/2015  . Secondary hyperparathyroidism of renal origin ([place])   . Sinus node dysfunction (HCC)   . Sleep apnea     Past Surgical History:  Procedure Laterality Date  . AMPUTATION OF REPLICATED TOES  Mar 7322   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)  . AMPUTATION TOE Right 03/12/2015   Procedure: RIGHT HALLUS AMPUTATION ;  Surgeon: [name], MD;  Location:  [facility];  Service: Podiatry;  Laterality: Right;  . CARDIAC CATHETERIZATION  11-25-2010   [place], [place]   Nonobstructive CAD  . CARDIAC PACEMAKER PLACEMENT  Nov 2009   [employer]  . CHOLECYSTECTOMY N/A 03/05/2017   Procedure: LAPAROSCOPIC CHOLECYSTECTOMY WITH INTRAOPERATIVE CHOLANGIOGRAM;  Surgeon: [name], MD;  [facility];  Service: General;  Laterality: N/A;  . EP IMPLANTABLE DEVICE N/A 06/03/2015   Procedure: PPM Generator Changeout;  Surgeon: [name], MD;  Location: [facility];  Service: Cardiovascular;  Laterality: N/A;  . EXCISION BONE CYST Right 03/06/2015   Procedure: BONE BIOPSIES OF RIGHT FOOT;  Surgeon: [name], MD;  Location: [facility];  Service: Podiatry;  Laterality: Right;  . ORIF ANKLE FRACTURE Left 11/06/2014   Procedure: OPEN REDUCTION INTERNAL FIXATION (ORIF) LEFT  ANKLE FRACTURE;  Surgeon: [name], MD;  Location: [facility];  Service: Orthopedics;  Laterality: Left;  . TEE WITHOUT CARDIOVERSION N/A 09/01/2017   Procedure: TRANSESOPHAGEAL ECHOCARDIOGRAM (TEE);  Surgeon: [name], MD;  Location: [facility];  Service: Cardiovascular;  Laterality: N/A;  . TOTAL KNEE ARTHROPLASTY    . VEIN LIGATION AND STRIPPING      There were no vitals filed  for this visit.  Subjective Assessment - 10/10/17 0938    Pertinent History  .PMH:CVA, history of type 2 diabetes mellitus with neuropathy, CAD, cardiac arrest in hospital, R toe amputations, CKD stage III, age-related macular degeneration    Patient Stated Goals  to get back to prior functional level    Currently in Pain?  No/denies                           OT Education - 10/11/17 1528    Education provided  Yes    Education Details  memory compensation strategies, red theraband HEP 10-15 reps each, min v.c,-see pt instructions,  recommendation pt does not drive dueto cogntive deficits.    Person(s) Educated   Patient;Spouse    Methods  Explanation;Demonstration;Verbal cues    Comprehension  Verbalized understanding;Returned demonstration;Verbal cues required       OT Short Term Goals - 10/10/17 0957      OT SHORT TERM GOAL #1   Title  I with HEP for UE strength      OT SHORT TERM GOAL #2   Title  Pt will verbalize understanding of compensatory strategies for short term memory deficits.    Status  On-going      OT SHORT TERM GOAL #3   Title  Pt will perform basic home management/ cooking with supervision and no LOB demonstrating good safety awareness.    Status  On-going      OT SHORT TERM GOAL #4   Title  Pt demonstrate selective attention to a functional task  in a busy environment x 20 without redirection.    Status  On-going      OT SHORT TERM GOAL #5   Title  Further assess cogntion and set additional goals prn    Status  Deferred        OT Long Term Goals - 10/03/17 1326      OT LONG TERM GOAL #1   Title  Pt will perform home managment and cooking activities modified independently demonstrating good safety awareness and no LOB.    Time  8    Period  Weeks    Status  New      OT LONG TERM GOAL #2   Title  Pt will demonstrate ability to perfrom a physical and cogntive task simultaneously with 90% or better accuracy.    Time  8    Period  Weeks    Status  New      OT LONG TERM GOAL #3   Title  Pt will perform basic money exchange for functional setting with 90% or greater accuracy    Time  8    Period  Weeks    Status  New            Plan - 10/11/17 1529    Clinical Impression Statement  Pt is progressing towards goals. Pt/ wife verbalize understanding of HEP for red theraband.    Occupational Profile and client history currently impacting functional performance  Prior to CVA pt was modified independent with all basic ADLS, IADLS and ambulating with a cane, he enjoys feeding the birds.PMH:CVA, history of type 2 diabetes mellitus with neuropathy, CAD, cardiac  arrest in hospital, R toe amputations, CKD stage III, age-related macular degeneration    Occupational performance deficits (Please refer to evaluation for details):  ADL's;IADL's;Social Participation    Rehab Potential  Good    Current Impairments/barriers affecting progress:  cogntive  deficits, toe amputations, neuropathy affecting mobility    OT Frequency  2x / week    OT Duration  8 weeks    OT Treatment/Interventions  Self-care/ADL training;Therapeutic exercise;Visual/perceptual remediation/compensation;Patient/family education;Neuromuscular education;Therapeutic activities;Functional Mobility Training;Energy conservation;Cryotherapy;Ultrasound;Passive range of motion;Cognitive remediation/compensation;Manual Therapy;DME and/or AE instruction;Fluidtherapy;Moist Heat;Paraffin    Plan  check on HEP, environmental scanning with a cogntive component    Consulted and Agree with Plan of Care  Patient       Patient will benefit from skilled therapeutic intervention in order to improve the following deficits and impairments:  Abnormal gait, Decreased cognition, Decreased knowledge of use of DME, Impaired flexibility, Impaired vision/preception, Decreased mobility, Decreased activity tolerance, Decreased endurance, Impaired UE functional use, Difficulty walking, Decreased safety awareness, Decreased knowledge of precautions, Decreased balance  Visit Diagnosis: Muscle weakness (generalized)  Cognitive social or emotional deficit following cerebral infarction    Problem List Patient Active Problem List   Diagnosis Date Noted  . CKD (chronic kidney disease) stage 4, GFR 15-29 ml/min (HCC) 09/26/2017  . 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 Diastolic dysfunction   . Coronary artery disease involving native coronary artery of native heart without angina pectoris   . Cardiac pacemaker in situ   . Atrial fibrillation with rapid ventricular response  ([place])   . PVD (peripheral vascular disease) ([place])   . Lower extremity edema 04/21/2017  . Anxiety 03/22/2017  . Acute cholecystitis s/p lap cholecystectomy 03/05/2017 03/04/2017  . HOCM (hypertrophic obstructive cardiomyopathy) ([place]) 03/04/2017  . 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 Fatigue 01/27/2017  . Low vitamin B12 level 01/27/2017  . Syncope 07/24/2015  . 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 OSA on CPAP 10/26/2014  . Back pain 10/16/2014  . Seasonal and perennial allergic rhinitis 10/25/2013  . Hypertension associated with diabetes ([place]) 10/25/2013  . Hyperlipidemia associated with type 2 diabetes mellitus ([place]) 10/25/2013  . Morbid obesity ([place]) 10/25/2013    RINE,KATHRYN 10/11/2017, 3:32 PM  [facility] [address] [place], [name], [address] Phone: [phone]   Fax:  [phone]  Name: David [name] MRN: [id number] Date of Birth: 1940-06-18

## 2017-10-13 ENCOUNTER — Encounter: Payer: Self-pay | Admitting: Physical Therapy

## 2017-10-13 ENCOUNTER — Ambulatory Visit: Payer: Medicare Other | Admitting: Physical Therapy

## 2017-10-13 ENCOUNTER — Encounter: Payer: Self-pay | Admitting: Occupational Therapy

## 2017-10-13 ENCOUNTER — Ambulatory Visit: Payer: Medicare Other | Admitting: Occupational Therapy

## 2017-10-13 DIAGNOSIS — R2689 Other abnormalities of gait and mobility: Secondary | ICD-10-CM

## 2017-10-13 DIAGNOSIS — R29818 Other symptoms and signs involving the nervous system: Secondary | ICD-10-CM

## 2017-10-13 DIAGNOSIS — I69315 Cognitive social or emotional deficit following cerebral infarction: Secondary | ICD-10-CM | POA: Diagnosis not present

## 2017-10-13 DIAGNOSIS — R293 Abnormal posture: Secondary | ICD-10-CM | POA: Diagnosis not present

## 2017-10-13 DIAGNOSIS — M6281 Muscle weakness (generalized): Secondary | ICD-10-CM | POA: Diagnosis not present

## 2017-10-13 DIAGNOSIS — R41841 Cognitive communication deficit: Secondary | ICD-10-CM | POA: Diagnosis not present

## 2017-10-13 NOTE — Therapy (Signed)
[facility] [address] [place], [name], [address] Phone: [phone]   Fax:  [phone]  Physical Therapy Treatment  Patient Details  Name: David [name] MRN: [id number] Date of Birth: 16-Sep-1940 Referring Provider: Dr. Naaman Plummer   Encounter Date: 10/13/2017  PT End of Session - 10/13/17 1548    Visit Number  4    Number of Visits  17    Date for PT Re-Evaluation  12/17/17    Authorization Type  Medicare; [employer]    Authorization Time Period  09/18/17 to 12/17/17    PT Start Time  1447    PT Stop Time  1529    PT Time Calculation (min)  42 min    Activity Tolerance  Patient tolerated treatment well    Behavior During Therapy  [facility] for tasks assessed/performed       Past Medical History:  Diagnosis Date  . Anemia, iron deficiency   . Anxiety   . Arthritis   . BPH (benign prostatic hypertrophy)   . CAD (coronary artery disease)    Nonobstructive CAD per cath  . Cardiac pacemaker in situ   . CHF (congestive heart failure) ([place])   . Chronic ulcer of right foot ([place])   . CKD (chronic kidney disease), stage III ([place]) secondary to DM and HTN   nephrologist-  [name]  . Dyspnea   . History of cellulitis    right great toe 10-25-2014  . History of skin cancer   . HOCM (hypertrophic obstructive cardiomyopathy) ([place])   . Hypertension   . Insulin dependent type 2 diabetes mellitus ([place]) 1991   followd by [name]--  has insulin pump  . Insulin pump in place   . OSA on CPAP   . Peripheral neuropathy    severe  . Peripheral vascular disease ([place])    bilateral lower extremities  . Rib fracture 07/24/2015  . Secondary hyperparathyroidism of renal origin ([place])   . Sinus node dysfunction (HCC)   . Sleep apnea     Past Surgical History:  Procedure Laterality Date  . AMPUTATION OF REPLICATED TOES  Mar 1324   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)  . AMPUTATION TOE Right 03/12/2015   Procedure: RIGHT HALLUS  AMPUTATION ;  Surgeon: [name], MD;  Location: [facility];  Service: Podiatry;  Laterality: Right;  . CARDIAC CATHETERIZATION  11-25-2010   [place], [place]   Nonobstructive CAD  . CARDIAC PACEMAKER PLACEMENT  Nov 2009   [employer]  . CHOLECYSTECTOMY N/A 03/05/2017   Procedure: LAPAROSCOPIC CHOLECYSTECTOMY WITH INTRAOPERATIVE CHOLANGIOGRAM;  Surgeon: [name], MD;  [facility]: [facility];  Service: General;  Laterality: N/A;  . EP IMPLANTABLE DEVICE N/A 06/03/2015   Procedure: PPM Generator Changeout;  Surgeon: [name], MD;  Location: [facility];  Service: Cardiovascular;  Laterality: N/A;  . EXCISION BONE CYST Right 03/06/2015   Procedure: BONE BIOPSIES OF RIGHT FOOT;  Surgeon: [name], MD;  Location: [facility];  Service: Podiatry;  Laterality: Right;  . ORIF ANKLE FRACTURE Left 11/06/2014   Procedure: OPEN REDUCTION INTERNAL FIXATION (ORIF) LEFT  ANKLE FRACTURE;  Surgeon: [name], MD;  Location: [facility];  Service: Orthopedics;  Laterality: Left;  . TEE WITHOUT CARDIOVERSION N/A 09/01/2017   Procedure: TRANSESOPHAGEAL ECHOCARDIOGRAM (TEE);  Surgeon: [name], MD;  Location: [facility];  Service: Cardiovascular;  Laterality: N/A;  . TOTAL KNEE ARTHROPLASTY    . VEIN LIGATION AND  STRIPPING      There were no vitals filed for this visit.  Subjective Assessment - 10/13/17 1450    Subjective  Doing exercises each morning.     Pertinent History  HTN, morbid obesity, CKD, cardiomyopathy, PPM, macular degeneration, CAD, cardiac arrest in hospital, DM, diabetic neuropathy; PVD, Rt 1-2 toe amputations, partial rt 3rd toe amputation, lt ankle ORIF 6/16    Limitations  Walking    How long can you walk comfortably?  about 10 minutes    Patient Stated Goals  get back to using my cane; feeding the birds    Currently in Pain?  No/denies                       [facility] Adult PT Treatment/Exercise - 10/13/17 1538       Transfers   Transfers  Sit to Stand;Stand to Sit    Sit to Stand  4: Min guard;6: Modified independent (Device/Increase time);With upper extremity assist;Without upper extremity assist;From bed    Stand to Sit  6: Modified independent (Device/Increase time)    Number of Reps  -- 5    Comments  vc for technique to allow him to not use his arms      Ambulation/Gait   Ambulation/Gait  Yes    Ambulation/Gait Assistance  6: Modified independent (Device/Increase time)    Ambulation Distance (Feet)  120 Feet 60 x 4; plus treadmill    Assistive device  Straight cane    Gait Pattern  Step-through pattern;Decreased step length - right;Decreased stance time - left;Wide base of support    Ambulation Surface  Indoor    Gait Comments  vc for step length, foot clearance and heel-toe progression; as fatigues incr lateral instability; stagger to right x 1 with independent recovery      Exercises   Exercises  Knee/Hip      Knee/Hip Exercises: Aerobic   Tread Mill  bil UE support: 2:00 fwd 0.5 mph; 2:00 minutes backward 0.3 mph      Knee/Hip Exercises: Seated   Sit to Sand  1 set;5 reps;without UE support lt foot on 2" block      Ankle Exercises: Stretches   Other Stretch  tibialis anterior in sitting (propped on heel and trying to reach forefoot to floor) pt reports has been stiff since ankle repair years ago      Ankle Exercises: Seated   Heel Raises  Both;20 reps    Heel Raises Limitations  with 10# weight across thigh; attempted with green tband resistance without success         Balance Exercises - 10/13/17 1546      Balance Exercises: Standing   Standing Eyes Opened  Narrow base of support (BOS);Head turns;Wide (BOA);Foam/compliant surface    Standing Eyes Closed  Wide (BOA);Foam/compliant surface    Tandem Stance  Eyes open    Stepping Strategy  Anterior;Posterior;Other reps (comment);UE support at counter, single UE support    Rockerboard  Anterior/posterior;Head  turns;EO;Intermittent UE support    Tandem Gait  Forward;Retro;Upper extremity support // bars          PT Short Term Goals - 09/18/17 1934      PT SHORT TERM GOAL #1   Title  Patient will complete HEP with supervision of family to assure proper technique. (Target for all STGs 11/01/2017--date reflects 4 weeks after 1st treatment 10/03/17)    Time  4    Period  Weeks    Status  New    Target Date  11/01/17      PT SHORT TERM GOAL #2   Title  Patient will improve TUG normal to <13.5 seconds with SPC to demonstrate lesser fall risk.     Time  4    Period  Weeks    Status  New      PT SHORT TERM GOAL #3   Title  Patient will improve Berg score >=44/56 demonstrating adequate balance for use of cane indoors.     Time  4    Period  Weeks    Status  New      PT SHORT TERM GOAL #4   Title  Patient can verbalize signs/symptoms of CVA, appropriate actions to take, and understanding of need to get to hospital within 3 hours of onset of symptoms    Time  4    Period  Weeks        PT Long Term Goals - 09/18/17 1946      PT LONG TERM GOAL #1   Title  Patient will be independent with HEP (using handouts) for balance and strengthening. (Target for all LTG's 12/02/2017--based on late start date)    Time  8    Period  Weeks    Status  New    Target Date  12/02/17      PT LONG TERM GOAL #2   Title  Patient will improve gait velocity to >=2.62 ft/sec with least restrictive assistive device (indicative of velocity for safe community ambulation)    Time  8    Period  Weeks    Status  New      PT LONG TERM GOAL #3   Title  Patient will improve Berg to >=47/56 indicative of safe use of cane outdoors.     Time  8    Period  Weeks    Status  New      PT LONG TERM GOAL #4   Title  Patient able to ambulate >= 200 ft outdoors with cane modified independent over unlevel ground to allow safe walking to his bird feeders in his back yard.     Time  8    Period  Weeks    Status  New             Plan - 10/13/17 1550    Clinical Impression Statement  Session focused on assuring correct technique with HEP, gait, strength and balance training. Multiple compliant or moving surfaces utilized with pt turning head to increase challenge to his vestibular system. Patient tolerated up on his feet majority of session (2 rest breaks, <2 minutes each). Patient making progress and can continue to benefit from PT>     Rehab Potential  Good    Clinical Impairments Affecting Rehab Potential  - Multiple co-morbidities; + very motivated    PT Frequency  2x / week    PT Duration  8 weeks    PT Treatment/Interventions  ADLs/Self Care Home Management;Aquatic Therapy;Electrical Stimulation;Gait training;DME Instruction;Stair training;Functional mobility training;Therapeutic activities;Therapeutic exercise;Balance training;Neuromuscular re-education;Cognitive remediation;Manual techniques;Orthotic Fit/Training;Patient/family education;Passive range of motion    PT Next Visit Plan  continue to address strengthening (esp LLE) and balance (lacks sensation and limited lt ankle ROM due to prior injury); safety with SPC; review s/s of CVA and appropriate actions    PT Home Exercise Plan  reports no HEP given on CIR, wife confirms    Consulted and Agree with Plan of Care  Patient;Family member/caregiver  Family Member Consulted  wife       Patient will benefit from skilled therapeutic intervention in order to improve the following deficits and impairments:  Abnormal gait, Decreased activity tolerance, Decreased balance, Decreased cognition, Decreased mobility, Decreased knowledge of use of DME, Decreased strength, Increased edema, Impaired sensation, Postural dysfunction, Impaired UE functional use, Obesity  Visit Diagnosis: Muscle weakness (generalized)  Other abnormalities of gait and mobility  Other symptoms and signs involving the nervous system     Problem List Patient Active Problem List    Diagnosis Date Noted  . CKD (chronic kidney disease) stage 4, GFR 15-29 ml/min (HCC) 09/26/2017  . 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 Diastolic dysfunction   . Coronary artery disease involving native coronary artery of native heart without angina pectoris   . Cardiac pacemaker in situ   . Atrial fibrillation with rapid ventricular response ([place])   . PVD (peripheral vascular disease) ([place])   . Lower extremity edema 04/21/2017  . Anxiety 03/22/2017  . Acute cholecystitis s/p lap cholecystectomy 03/05/2017 03/04/2017  . HOCM (hypertrophic obstructive cardiomyopathy) ([place]) 03/04/2017  . 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 Fatigue 01/27/2017  . Low vitamin B12 level 01/27/2017  . Syncope 07/24/2015  . 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 OSA on CPAP 10/26/2014  . Back pain 10/16/2014  . Seasonal and perennial allergic rhinitis 10/25/2013  . Hypertension associated with diabetes ([place]) 10/25/2013  . Hyperlipidemia associated with type 2 diabetes mellitus ([place]) 10/25/2013  . Morbid obesity ([place]) 10/25/2013    Rexanne Mano, PT  10/13/2017, 3:55 PM  [facility] [address] [place], [name], [address] Phone: [phone]   Fax:  [phone]  Name: David [name] MRN: [id number] Date of Birth: Oct 06, 1940

## 2017-10-13 NOTE — Therapy (Signed)
[facility] [address] [place], [name], [address] Phone: [phone]   Fax:  [phone]  Occupational Therapy Treatment  Patient Details  Name: David [name] MRN: [id number] Date of Birth: April 12, 1941 Referring Provider: Dr. Naaman Plummer   Encounter Date: 10/13/2017  OT End of Session - 10/13/17 1404    Visit Number  4    Number of Visits  17    Date for OT Re-Evaluation  11/18/17    Authorization Type  Medicare/ [employer]    OT Start Time  1403    OT Stop Time  1445    OT Time Calculation (min)  42 min       Past Medical History:  Diagnosis Date  . Anemia, iron deficiency   . Anxiety   . Arthritis   . BPH (benign prostatic hypertrophy)   . CAD (coronary artery disease)    Nonobstructive CAD per cath  . Cardiac pacemaker in situ   . CHF (congestive heart failure) ([place])   . Chronic ulcer of right foot ([place])   . CKD (chronic kidney disease), stage III ([place]) secondary to DM and HTN   nephrologist-  [name]  . Dyspnea   . History of cellulitis    right great toe 10-25-2014  . History of skin cancer   . HOCM (hypertrophic obstructive cardiomyopathy) ([place])   . Hypertension   . Insulin dependent type 2 diabetes mellitus ([place]) 1991   followd by [name]--  has insulin pump  . Insulin pump in place   . OSA on CPAP   . Peripheral neuropathy    severe  . Peripheral vascular disease ([place])    bilateral lower extremities  . Rib fracture 07/24/2015  . Secondary hyperparathyroidism of renal origin ([place])   . Sinus node dysfunction (H[facility])   . Sleep apnea     Past Surgical History:  Procedure Laterality Date  . AMPUTATION OF REPLICATED TOES  Mar 3474   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)  . AMPUTATION TOE Right 03/12/2015   Procedure: RIGHT HALLUS AMPUTATION ;  Surgeon: [name], MD;  Location: [facility];  Service: Podiatry;  Laterality: Right;  . CARDIAC CATHETERIZATION  11-25-2010    [place], [place]   Nonobstructive CAD  . CARDIAC PACEMAKER PLACEMENT  Nov 2009   [employer]  . CHOLECYSTECTOMY N/A 03/05/2017   Procedure: LAPAROSCOPIC CHOLECYSTECTOMY WITH INTRAOPERATIVE CHOLANGIOGRAM;  Surgeon: [name], MD;  [facility];  Service: General;  Laterality: N/A;  . EP IMPLANTABLE DEVICE N/A 06/03/2015   Procedure: PPM Generator Changeout;  Surgeon: [name], MD;  Location: [facility];  Service: Cardiovascular;  Laterality: N/A;  . EXCISION BONE CYST Right 03/06/2015   Procedure: BONE BIOPSIES OF RIGHT FOOT;  Surgeon: [name], MD;  Location: [facility];  Service: Podiatry;  Laterality: Right;  . ORIF ANKLE FRACTURE Left 11/06/2014   Procedure: OPEN REDUCTION INTERNAL FIXATION (ORIF) LEFT  ANKLE FRACTURE;  Surgeon: [name], MD;  Location: [facility];  Service: Orthopedics;  Laterality: Left;  . TEE WITHOUT CARDIOVERSION N/A 09/01/2017   Procedure: TRANSESOPHAGEAL ECHOCARDIOGRAM (TEE);  Surgeon: [name], MD;  Location: [facility];  Service: Cardiovascular;  Laterality: N/A;  . TOTAL KNEE ARTHROPLASTY    . VEIN LIGATION AND STRIPPING      There were no vitals filed for this visit.  Subjective Assessment - 10/13/17 1403    Subjective   Denies pain, just reports muscle soreness  form ex    Pertinent History  .PMH:CVA, history of type 2 diabetes mellitus with neuropathy, CAD, cardiac arrest in hospital, R toe amputations, CKD stage III, age-related macular degeneration    Patient Stated Goals  to get back to prior functional level    Currently in Pain?  No/denies               Treatment: Environmental scanning with a cognitive component to locate items in sequential order, 11/13 items located on first pass. Pt required increased time and multiple rest breaks to locate items due to decreased enduracne. Pt reports task was more challenging than expected. Activities on I-pad for visual memory and attention,  min v.c Pipe tree design for problem solving and attention in a min distracting environment. Pt organized the pieces and completed task correctly without v.c              OT Short Term Goals - 10/13/17 1404      OT SHORT TERM GOAL #1   Title  I with HEP for UE strength      OT SHORT TERM GOAL #2   Title  Pt will verbalize understanding of compensatory strategies for short term memory deficits.    Status  Achieved      OT SHORT TERM GOAL #3   Title  Pt will perform basic home management/ cooking with supervision and no LOB demonstrating good safety awareness.    Status  On-going      OT SHORT TERM GOAL #4   Title  Pt demonstrate selective attention to a functional task  in a busy environment x 20 without redirection.    Status  On-going      OT SHORT TERM GOAL #5   Title  Further assess cogntion and set additional goals prn    Status  Deferred        OT Long Term Goals - 10/03/17 1326      OT LONG TERM GOAL #1   Title  Pt will perform home managment and cooking activities modified independently demonstrating good safety awareness and no LOB.    Time  8    Period  Weeks    Status  New      OT LONG TERM GOAL #2   Title  Pt will demonstrate ability to perfrom a physical and cogntive task simultaneously with 90% or better accuracy.    Time  8    Period  Weeks    Status  New      OT LONG TERM GOAL #3   Title  Pt will perform basic money exchange for functional setting with 90% or greater accuracy    Time  8    Period  Weeks    Status  New            Plan - 10/13/17 1440    Clinical Impression Statement  Pt is progressing towards goals. Pt reports understanding of  red theraband HEP.    Occupational Profile and client history currently impacting functional performance  Prior to CVA pt was modified independent with all basic ADLS, IADLS and ambulating with a cane, he enjoys feeding the birds.PMH:CVA, history of type 2 diabetes mellitus with neuropathy, CAD,  cardiac arrest in hospital, R toe amputations, CKD stage III, age-related macular degeneration    Occupational performance deficits (Please refer to evaluation for details):  ADL's;IADL's;Social Participation    Rehab Potential  Good    Current Impairments/barriers affecting progress:  cogntive deficits, toe  amputations, neuropathy affecting mobility    OT Frequency  2x / week    OT Duration  8 weeks    OT Treatment/Interventions  Self-care/ADL training;Therapeutic exercise;Visual/perceptual remediation/compensation;Patient/family education;Neuromuscular education;Therapeutic activities;Functional Mobility Training;Energy conservation;Cryotherapy;Ultrasound;Passive range of motion;Cognitive remediation/compensation;Manual Therapy;DME and/or AE instruction;Fluidtherapy;Moist Heat;Paraffin    Plan  continue to address attention, problem solving through functional activity, multi tasking    Clinical Decision Making  Limited treatment options, no task modification necessary    Consulted and Agree with Plan of Care  Patient       Patient will benefit from skilled therapeutic intervention in order to improve the following deficits and impairments:     Visit Diagnosis: Muscle weakness (generalized)  Cognitive social or emotional deficit following cerebral infarction  Other symptoms and signs involving the nervous system    Problem List Patient Active Problem List   Diagnosis Date Noted  . CKD (chronic kidney disease) stage 4, GFR 15-29 ml/min (HCC) 09/26/2017  . 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 Diastolic dysfunction   . Coronary artery disease involving native coronary artery of native heart without angina pectoris   . Cardiac pacemaker in situ   . Atrial fibrillation with rapid ventricular response ([place])   . PVD (peripheral vascular disease) ([place])   . Lower extremity edema 04/21/2017  . Anxiety 03/22/2017  . Acute cholecystitis s/p lap  cholecystectomy 03/05/2017 03/04/2017  . HOCM (hypertrophic obstructive cardiomyopathy) ([place]) 03/04/2017  . 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 Fatigue 01/27/2017  . Low vitamin B12 level 01/27/2017  . Syncope 07/24/2015  . 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 OSA on CPAP 10/26/2014  . Back pain 10/16/2014  . Seasonal and perennial allergic rhinitis 10/25/2013  . Hypertension associated with diabetes ([place]) 10/25/2013  . Hyperlipidemia associated with type 2 diabetes mellitus ([place]) 10/25/2013  . Morbid obesity ([place]) 10/25/2013    Reyn Faivre 10/13/2017, 2:43 PM  [facility] [address] [place], [name], [address] Phone: [phone]   Fax:  [phone]  Name: David [name] MRN: [id number] Date of Birth: 30-Jan-1941

## 2017-10-17 ENCOUNTER — Encounter: Payer: Medicare Other | Admitting: Occupational Therapy

## 2017-10-17 ENCOUNTER — Ambulatory Visit: Payer: Medicare Other | Admitting: Physical Therapy

## 2017-10-17 DIAGNOSIS — E119 Type 2 diabetes mellitus without complications: Secondary | ICD-10-CM | POA: Diagnosis not present

## 2017-10-18 DIAGNOSIS — Z0100 Encounter for examination of eyes and vision without abnormal findings: Secondary | ICD-10-CM | POA: Diagnosis not present

## 2017-10-18 DIAGNOSIS — H2513 Age-related nuclear cataract, bilateral: Secondary | ICD-10-CM | POA: Diagnosis not present

## 2017-10-19 ENCOUNTER — Encounter: Payer: Self-pay | Admitting: Physical Therapy

## 2017-10-19 ENCOUNTER — Ambulatory Visit: Payer: Medicare Other | Admitting: Occupational Therapy

## 2017-10-19 ENCOUNTER — Ambulatory Visit: Payer: Medicare Other | Attending: Physical Medicine & Rehabilitation | Admitting: Physical Therapy

## 2017-10-19 DIAGNOSIS — R293 Abnormal posture: Secondary | ICD-10-CM | POA: Diagnosis not present

## 2017-10-19 DIAGNOSIS — R29818 Other symptoms and signs involving the nervous system: Secondary | ICD-10-CM | POA: Diagnosis not present

## 2017-10-19 DIAGNOSIS — M6281 Muscle weakness (generalized): Secondary | ICD-10-CM | POA: Insufficient documentation

## 2017-10-19 DIAGNOSIS — R2689 Other abnormalities of gait and mobility: Secondary | ICD-10-CM | POA: Diagnosis not present

## 2017-10-19 DIAGNOSIS — I69315 Cognitive social or emotional deficit following cerebral infarction: Secondary | ICD-10-CM

## 2017-10-19 NOTE — Therapy (Signed)
[facility] [address] [place], [name], [address] Phone: [phone]   Fax:  [phone]  Occupational Therapy Treatment  Patient Details  Name: David [name] MRN: [id number] Date of Birth: 21-Apr-1941 Referring Provider: Dr. Naaman Plummer   Encounter Date: 10/19/2017  OT End of Session - 10/19/17 1715    Visit Number  5    Number of Visits  17    Date for OT Re-Evaluation  11/18/17    Authorization Type  Medicare/ [employer]       Past Medical History:  Diagnosis Date  . Anemia, iron deficiency   . Anxiety   . Arthritis   . BPH (benign prostatic hypertrophy)   . CAD (coronary artery disease)    Nonobstructive CAD per cath  . Cardiac pacemaker in situ   . CHF (congestive heart failure) ([place])   . Chronic ulcer of right foot ([place])   . CKD (chronic kidney disease), stage III ([place]) secondary to DM and HTN   nephrologist-  [name]  . Dyspnea   . History of cellulitis    right great toe 10-25-2014  . History of skin cancer   . HOCM (hypertrophic obstructive cardiomyopathy) ([place])   . Hypertension   . Insulin dependent type 2 diabetes mellitus ([place]) 1991   followd by [name]--  has insulin pump  . Insulin pump in place   . OSA on CPAP   . Peripheral neuropathy    severe  . Peripheral vascular disease ([place])    bilateral lower extremities  . Rib fracture 07/24/2015  . Secondary hyperparathyroidism of renal origin ([place])   . Sinus node dysfunction (HCC)   . Sleep apnea     Past Surgical History:  Procedure Laterality Date  . AMPUTATION OF REPLICATED TOES  Mar 9211   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)  . AMPUTATION TOE Right 03/12/2015   Procedure: RIGHT HALLUS AMPUTATION ;  Surgeon: [name], MD;  Location: [facility];  Service: Podiatry;  Laterality: Right;  . CARDIAC CATHETERIZATION  11-25-2010   [place], [place]   Nonobstructive CAD  . CARDIAC PACEMAKER PLACEMENT  Nov 2009   [employer]  . CHOLECYSTECTOMY N/A 03/05/2017   Procedure: LAPAROSCOPIC CHOLECYSTECTOMY WITH INTRAOPERATIVE CHOLANGIOGRAM;  Surgeon: [name], MD;  [facility];  Service: General;  Laterality: N/A;  . EP IMPLANTABLE DEVICE N/A 06/03/2015   Procedure: PPM Generator Changeout;  Surgeon: [name], MD;  Location: [facility];  Service: Cardiovascular;  Laterality: N/A;  . EXCISION BONE CYST Right 03/06/2015   Procedure: BONE BIOPSIES OF RIGHT FOOT;  Surgeon: [name], MD;  Location: [facility];  Service: Podiatry;  Laterality: Right;  . ORIF ANKLE FRACTURE Left 11/06/2014   Procedure: OPEN REDUCTION INTERNAL FIXATION (ORIF) LEFT  ANKLE FRACTURE;  Surgeon: [name], MD;  Location: [facility];  Service: Orthopedics;  Laterality: Left;  . TEE WITHOUT CARDIOVERSION N/A 09/01/2017   Procedure: TRANSESOPHAGEAL ECHOCARDIOGRAM (TEE);  Surgeon: [name], MD;  Location: [facility];  Service: Cardiovascular;  Laterality: N/A;  . TOTAL KNEE ARTHROPLASTY    . VEIN LIGATION AND STRIPPING      There were no vitals filed for this visit.  Subjective Assessment - 10/19/17 1459    Pertinent History  .PMH:CVA, history of type 2 diabetes mellitus with neuropathy, CAD, cardiac arrest in hospital, R toe amputations, CKD stage III, age-related macular degeneration    Patient Stated Goals  to get back  to prior functional level    Currently in Pain?  Yes    Pain Score  3     Pain Location  Back    Pain Descriptors / Indicators  Aching    Pain Onset  More than a month ago    Pain Frequency  Intermittent    Aggravating Factors   unknown    Pain Relieving Factors  tylenol                   Treatment: simple cooking task to fry an egg, pt located items and performed safety with distant supervision. Pt turned off stove without prompting. Divided and alternating attention activities with min-mod difficulty. Ambulating while performing category  generation, mod v.c pt fatigued quickly           OT Short Term Goals - 10/19/17 1530      OT SHORT TERM GOAL #1   Title  I with HEP for UE strength    Status  Achieved      OT SHORT TERM GOAL #2   Title  Pt will verbalize understanding of compensatory strategies for short term memory deficits.      OT SHORT TERM GOAL #3   Title  Pt will perform basic home management/ cooking with supervision and no LOB demonstrating good safety awareness.    Time  4    Period  Weeks    Status  Achieved      OT SHORT TERM GOAL #4   Title  Pt demonstrate selective attention to a functional task  in a busy environment x 20 without redirection.    Status  On-going      OT SHORT TERM GOAL #5   Title  Further assess cogntion and set additional goals prn        OT Long Term Goals - 10/19/17 1500      OT LONG TERM GOAL #1   Title  Pt will perform home managment and cooking activities modified independently demonstrating good safety awareness and no LOB.      OT LONG TERM GOAL #2   Title  Pt will demonstrate ability to perfrom a physical and cogntive task simultaneously with 90% or better accuracy.            Plan - 10/19/17 1716    Clinical Impression Statement  Pt is progressing towards goals. Pt demonstrates ability to perform simple familiar cooking task  with distant supervision.    Rehab Potential  Good    Current Impairments/barriers affecting progress:  cogntive deficits, toe amputations, neuropathy affecting mobility    OT Frequency  2x / week    OT Duration  8 weeks    OT Treatment/Interventions  Self-care/ADL training;Therapeutic exercise;Visual/perceptual remediation/compensation;Patient/family education;Neuromuscular education;Therapeutic activities;Functional Mobility Training;Energy conservation;Cryotherapy;Ultrasound;Passive range of motion;Cognitive remediation/compensation;Manual Therapy;DME and/or AE instruction;Fluidtherapy;Moist Heat;Paraffin    Plan  continue to  address attention, problem solving through functional activity, multi tasking    Consulted and Agree with Plan of Care  Patient;Family member/caregiver       Patient will benefit from skilled therapeutic intervention in order to improve the following deficits and impairments:  Abnormal gait, Decreased cognition, Decreased knowledge of use of DME, Impaired flexibility, Impaired vision/preception, Decreased mobility, Decreased activity tolerance, Decreased endurance, Impaired UE functional use, Difficulty walking, Decreased safety awareness, Decreased knowledge of precautions, Decreased balance  Visit Diagnosis: Muscle weakness (generalized)  Cognitive social or emotional deficit following cerebral infarction  Other symptoms and signs involving the nervous system  Other abnormalities of gait and mobility    Problem List Patient Active Problem List   Diagnosis Date Noted  . CKD (chronic kidney disease) stage 4, GFR 15-29 ml/min (HCC) 09/26/2017  . 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 Diastolic dysfunction   . Coronary artery disease involving native coronary artery of native heart without angina pectoris   . Cardiac pacemaker in situ   . Atrial fibrillation with rapid ventricular response ([facility])   . PVD (peripheral vascular disease) ([facility])   . Lower extremity edema 04/21/2017  . Anxiety 03/22/2017  . Acute cholecystitis s/p lap cholecystectomy 03/05/2017 03/04/2017  . HOCM (hypertrophic obstructive cardiomyopathy) ([facility]) 03/04/2017  . 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 Fatigue 01/27/2017  . Low vitamin B12 level 01/27/2017  . Syncope 07/24/2015  . 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 OSA on CPAP 10/26/2014  . Back pain 10/16/2014  . Seasonal and perennial allergic rhinitis 10/25/2013  . Hypertension associated with diabetes ([facility]) 10/25/2013  . Hyperlipidemia associated  with type 2 diabetes mellitus ([place]) 10/25/2013  . Morbid obesity ([place]) 10/25/2013    RINE,KATHRYN 10/19/2017, 5:18 PM  [facility] [address] [place], [name], [address] Phone: [phone]   Fax:  [phone]  Name: David [name] MRN: [id number] Date of Birth: Jun 07, 1940

## 2017-10-19 NOTE — Therapy (Signed)
[facility] [address] [place], [name], [address] Phone: [phone]   Fax:  [phone]  Physical Therapy Treatment  Patient Details  Name: David [name] MRN: [id number] Date of Birth: March 08, 1941 Referring Provider: Dr. Naaman Plummer   Encounter Date: 10/19/2017  PT End of Session - 10/19/17 1638    Visit Number  5    Number of Visits  17    Date for PT Re-Evaluation  12/17/17    Authorization Type  Medicare; [employer]    Authorization Time Period  09/18/17 to 12/17/17    PT Start Time  1536    PT Stop Time  1620    PT Time Calculation (min)  44 min    Activity Tolerance  Patient tolerated treatment well    Behavior During Therapy  [facility] for tasks assessed/performed       Past Medical History:  Diagnosis Date  . Anemia, iron deficiency   . Anxiety   . Arthritis   . BPH (benign prostatic hypertrophy)   . CAD (coronary artery disease)    Nonobstructive CAD per cath  . Cardiac pacemaker in situ   . CHF (congestive heart failure) ([place])   . Chronic ulcer of right foot ([facility])   . CKD (chronic kidney disease), stage III ([facility]) secondary to DM and HTN   nephrologist-  [name]  . Dyspnea   . History of cellulitis    right great toe 10-25-2014  . History of skin cancer   . HOCM (hypertrophic obstructive cardiomyopathy) ([place])   . Hypertension   . Insulin dependent type 2 diabetes mellitus ([facility]) 1991   followd by [name]--  has insulin pump  . Insulin pump in place   . OSA on CPAP   . Peripheral neuropathy    severe  . Peripheral vascular disease ([place])    bilateral lower extremities  . Rib fracture 07/24/2015  . Secondary hyperparathyroidism of renal origin ([place])   . Sinus node dysfunction (HCC)   . Sleep apnea     Past Surgical History:  Procedure Laterality Date  . AMPUTATION OF REPLICATED TOES  Mar 3244   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)  . AMPUTATION TOE Right 03/12/2015   Procedure: RIGHT HALLUS  AMPUTATION ;  Surgeon: [name], MD;  Location: [facility];  Service: Podiatry;  Laterality: Right;  . CARDIAC CATHETERIZATION  11-25-2010   [place], [place]   Nonobstructive CAD  . CARDIAC PACEMAKER PLACEMENT  Nov 2009   [employer]  . CHOLECYSTECTOMY N/A 03/05/2017   Procedure: LAPAROSCOPIC CHOLECYSTECTOMY WITH INTRAOPERATIVE CHOLANGIOGRAM;  Surgeon: [name], MD;  [facility];  Service: General;  Laterality: N/A;  . EP IMPLANTABLE DEVICE N/A 06/03/2015   Procedure: PPM Generator Changeout;  Surgeon: [name], MD;  Location: [facility];  Service: Cardiovascular;  Laterality: N/A;  . EXCISION BONE CYST Right 03/06/2015   Procedure: BONE BIOPSIES OF RIGHT FOOT;  Surgeon: [name], MD;  Location: [facility];  Service: Podiatry;  Laterality: Right;  . ORIF ANKLE FRACTURE Left 11/06/2014   Procedure: OPEN REDUCTION INTERNAL FIXATION (ORIF) LEFT  ANKLE FRACTURE;  Surgeon: [name], MD;  Location: [facility];  Service: Orthopedics;  Laterality: Left;  . TEE WITHOUT CARDIOVERSION N/A 09/01/2017   Procedure: TRANSESOPHAGEAL ECHOCARDIOGRAM (TEE);  Surgeon: [name], MD;  Location: [facility];  Service: Cardiovascular;  Laterality: N/A;  . TOTAL KNEE ARTHROPLASTY    . VEIN LIGATION AND  STRIPPING      There were no vitals filed for this visit.  Subjective Assessment - 10/19/17 1542    Subjective  Doing exercises each morning still. Feels his knee arthritis is feeling better as he is exercising more.     Patient is accompained by:  Family member    Pertinent History  HTN, morbid obesity, CKD, cardiomyopathy, PPM, macular degeneration, CAD, cardiac arrest in hospital, DM, diabetic neuropathy; PVD, Rt 1-2 toe amputations, partial rt 3rd toe amputation, lt ankle ORIF 6/16    Limitations  Walking    How long can you walk comfortably?  about 10 minutes    Patient Stated Goals  get back to using my cane; feeding the birds     Currently in Pain?  Yes    Pain Score  3     Pain Location  Back    Pain Orientation  Upper    Pain Descriptors / Indicators  Aching    Pain Type  Chronic pain    Pain Onset  More than a month ago    Pain Frequency  Intermittent    Aggravating Factors   thinks it's worse because he was lifting and moving chairs yesterday         Treatment- Education re; signs and symptoms of CVA and actions to take (pt and wife)  Neuro re-ed-corner balance activities (for safety) with pt standing on ~1 inch foam, EO, EC, unilateral and bilateral UE movements, tiny marching; SLS with focus on stabilizing via hip abductors and not through UE support (pt progressed to able to maintain level pelvis for >1 second when removing his UE support  Gait training-with SPC >500 ft indoors with dual tasking with either cognitive challenge or managing passing a ball between lt and rt hands. Patient with improved balance throughout, however does decrease his velocity when dual-tasking.                       PT Education - 10/19/17 1637    Education Details  signs of CVA; need to call 18; get to hospital within 4 hrs of the beginning of symptoms and reason; incr risk of CVA for next year    Person(s) Educated  Patient;Spouse    Methods  Explanation    Comprehension  Verbalized understanding       PT Short Term Goals - 09/18/17 1934      PT SHORT TERM GOAL #1   Title  Patient will complete HEP with supervision of family to assure proper technique. (Target for all STGs 11/01/2017--date reflects 4 weeks after 1st treatment 10/03/17)    Time  4    Period  Weeks    Status  New    Target Date  11/01/17      PT SHORT TERM GOAL #2   Title  Patient will improve TUG normal to <13.5 seconds with SPC to demonstrate lesser fall risk.     Time  4    Period  Weeks    Status  New      PT SHORT TERM GOAL #3   Title  Patient will improve Berg score >=44/56 demonstrating adequate balance for use of cane  indoors.     Time  4    Period  Weeks    Status  New      PT SHORT TERM GOAL #4   Title  Patient can verbalize signs/symptoms of CVA, appropriate actions to take, and understanding of need to  get to hospital within 3 hours of onset of symptoms    Time  4    Period  Weeks        PT Long Term Goals - 09/18/17 1946      PT LONG TERM GOAL #1   Title  Patient will be independent with HEP (using handouts) for balance and strengthening. (Target for all LTG's 12/02/2017--based on late start date)    Time  8    Period  Weeks    Status  New    Target Date  12/02/17      PT LONG TERM GOAL #2   Title  Patient will improve gait velocity to >=2.62 ft/sec with least restrictive assistive device (indicative of velocity for safe community ambulation)    Time  8    Period  Weeks    Status  New      PT LONG TERM GOAL #3   Title  Patient will improve Berg to >=47/56 indicative of safe use of cane outdoors.     Time  8    Period  Weeks    Status  New      PT LONG TERM GOAL #4   Title  Patient able to ambulate >= 200 ft outdoors with cane modified independent over unlevel ground to allow safe walking to his bird feeders in his back yard.     Time  8    Period  Weeks    Status  New            Plan - 10/19/17 2108    Clinical Impression Statement  Focus on CVA education: signs and symptoms, need to dial 911 (and rationale) and timeframe for optimal treatment (4 hrs after onset of symptoms). Patient was unable to recall this information from prior education, however wife present and able to assist pt with answers. Remainder of session focused on gait training, balance training and strength training. Patient only requested one seated rest break (more due to knee pain than to muscular fatigue). He continues to make good progress physically.     Rehab Potential  Good    Clinical Impairments Affecting Rehab Potential  - Multiple co-morbidities; + very motivated    PT Frequency  2x / week    PT  Duration  8 weeks    PT Treatment/Interventions  ADLs/Self Care Home Management;Aquatic Therapy;Electrical Stimulation;Gait training;DME Instruction;Stair training;Functional mobility training;Therapeutic activities;Therapeutic exercise;Balance training;Neuromuscular re-education;Cognitive remediation;Manual techniques;Orthotic Fit/Training;Patient/family education;Passive range of motion    PT Next Visit Plan  continue to address strengthening (esp LLE) and balance (lacks sensation and limited lt ankle ROM due to prior injury); review s/s of CVA and appropriate actions (see if pt can answer--wife has already demonstrated her knowledge)    PT Home Exercise Plan  reports no HEP given on CIR, wife confirms    Consulted and Agree with Plan of Care  Patient;Family member/caregiver    Family Member Consulted  wife       Patient will benefit from skilled therapeutic intervention in order to improve the following deficits and impairments:  Abnormal gait, Decreased activity tolerance, Decreased balance, Decreased cognition, Decreased mobility, Decreased knowledge of use of DME, Decreased strength, Increased edema, Impaired sensation, Postural dysfunction, Impaired UE functional use, Obesity  Visit Diagnosis: Muscle weakness (generalized)  Other abnormalities of gait and mobility     Problem List Patient Active Problem List   Diagnosis Date Noted  . CKD (chronic kidney disease) stage 4, GFR 15-29 ml/min (HCC) 09/26/2017  .  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 Diastolic dysfunction   . Coronary artery disease involving native coronary artery of native heart without angina pectoris   . Cardiac pacemaker in situ   . Atrial fibrillation with rapid ventricular response ([place])   . PVD (peripheral vascular disease) ([place])   . Lower extremity edema 04/21/2017  . Anxiety 03/22/2017  . Acute cholecystitis s/p lap cholecystectomy 03/05/2017 03/04/2017  . HOCM  (hypertrophic obstructive cardiomyopathy) ([place]) 03/04/2017  . 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 Fatigue 01/27/2017  . Low vitamin B12 level 01/27/2017  . Syncope 07/24/2015  . 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 OSA on CPAP 10/26/2014  . Back pain 10/16/2014  . Seasonal and perennial allergic rhinitis 10/25/2013  . Hypertension associated with diabetes ([place]) 10/25/2013  . Hyperlipidemia associated with type 2 diabetes mellitus ([place]) 10/25/2013  . Morbid obesity ([place]) 10/25/2013    Rexanne Mano, PT 10/19/2017, 9:13 PM  [facility] [address] [place], [name], [address] Phone: [phone]   Fax:  [phone]  Name: David [name] MRN: [id number] Date of Birth: 1941-04-15

## 2017-10-23 ENCOUNTER — Other Ambulatory Visit: Payer: Self-pay

## 2017-10-23 MED ORDER — TORSEMIDE 10 MG PO TABS: 10.0000 mg | ORAL_TABLET | Freq: Two times a day (BID) | ORAL | 1 refills | Status: DC

## 2017-10-23 MED ORDER — CALCITRIOL 0.25 MCG PO CAPS: 0.2500 ug | ORAL_CAPSULE | Freq: Every day | ORAL | 1 refills | Status: DC

## 2017-10-24 ENCOUNTER — Encounter: Payer: Self-pay | Admitting: Physical Therapy

## 2017-10-24 ENCOUNTER — Other Ambulatory Visit (INDEPENDENT_AMBULATORY_CARE_PROVIDER_SITE_OTHER): Payer: Medicare Other

## 2017-10-24 ENCOUNTER — Ambulatory Visit: Payer: Medicare Other | Admitting: Physical Therapy

## 2017-10-24 ENCOUNTER — Ambulatory Visit: Payer: Medicare Other | Admitting: Occupational Therapy

## 2017-10-24 DIAGNOSIS — R2689 Other abnormalities of gait and mobility: Secondary | ICD-10-CM | POA: Diagnosis not present

## 2017-10-24 DIAGNOSIS — Z794 Long term (current) use of insulin: Secondary | ICD-10-CM

## 2017-10-24 DIAGNOSIS — M6281 Muscle weakness (generalized): Secondary | ICD-10-CM | POA: Diagnosis not present

## 2017-10-24 DIAGNOSIS — R293 Abnormal posture: Secondary | ICD-10-CM | POA: Diagnosis not present

## 2017-10-24 DIAGNOSIS — R29818 Other symptoms and signs involving the nervous system: Secondary | ICD-10-CM

## 2017-10-24 DIAGNOSIS — E1165 Type 2 diabetes mellitus with hyperglycemia: Secondary | ICD-10-CM

## 2017-10-24 DIAGNOSIS — I69315 Cognitive social or emotional deficit following cerebral infarction: Secondary | ICD-10-CM | POA: Diagnosis not present

## 2017-10-24 LAB — BASIC METABOLIC PANEL
BUN: 45 mg/dL — ABNORMAL HIGH (ref 6–23)
CO2: 26 mEq/L (ref 19–32)
Calcium: 9.4 mg/dL (ref 8.4–10.5)
Chloride: 105 mEq/L (ref 96–112)
Creatinine, Ser: 2.17 mg/dL — ABNORMAL HIGH (ref 0.40–1.50)
GFR: 31.48 mL/min — ABNORMAL LOW (ref >60.00)
Glucose, Bld: 296 mg/dL — ABNORMAL HIGH (ref 70–99)
Potassium: 5.1 mEq/L (ref 3.5–5.1)
Sodium: 137 mEq/L (ref 135–145)

## 2017-10-24 LAB — HEMOGLOBIN A1C: Hgb A1c MFr Bld: 8.3 % — ABNORMAL HIGH (ref 4.6–6.5)

## 2017-10-24 LAB — MICROALBUMIN / CREATININE URINE RATIO
Creatinine,U: 135.4 mg/dL
Microalb Creat Ratio: 8.8 mg/g (ref 0.0–30.0)
Microalb, Ur: 11.9 mg/dL — ABNORMAL HIGH (ref 0.0–1.9)

## 2017-10-24 NOTE — Therapy (Signed)
[facility] [address] [place], [name], [address] Phone: [phone]   Fax:  [phone]  Physical Therapy Treatment  Patient Details  Name: David [name] MRN: [id number] Date of Birth: 1941/05/16 Referring Provider: Dr. Naaman Plummer   Encounter Date: 10/24/2017  PT End of Session - 10/24/17 1506    Visit Number  6    Number of Visits  17    Date for PT Re-Evaluation  12/17/17    Authorization Type  Medicare; [employer]    Authorization Time Period  09/18/17 to 12/17/17    PT Start Time  1446    PT Stop Time  1535    PT Time Calculation (min)  49 min    Activity Tolerance  Patient tolerated treatment well    Behavior During Therapy  [facility] for tasks assessed/performed       Past Medical History:  Diagnosis Date  . Anemia, iron deficiency   . Anxiety   . Arthritis   . BPH (benign prostatic hypertrophy)   . CAD (coronary artery disease)    Nonobstructive CAD per cath  . Cardiac pacemaker in situ   . CHF (congestive heart failure) ([place])   . Chronic ulcer of right foot ([place])   . CKD (chronic kidney disease), stage III ([place]) secondary to DM and HTN   nephrologist-  [name]  . Dyspnea   . History of cellulitis    right great toe 10-25-2014  . History of skin cancer   . HOCM (hypertrophic obstructive cardiomyopathy) ([place])   . Hypertension   . Insulin dependent type 2 diabetes mellitus ([place]) 1991   followd by [name]--  has insulin pump  . Insulin pump in place   . OSA on CPAP   . Peripheral neuropathy    severe  . Peripheral vascular disease ([place])    bilateral lower extremities  . Rib fracture 07/24/2015  . Secondary hyperparathyroidism of renal origin ([place])   . Sinus node dysfunction (H[facility])   . Sleep apnea     Past Surgical History:  Procedure Laterality Date  . AMPUTATION OF REPLICATED TOES  Mar 2229   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)  . AMPUTATION TOE Right 03/12/2015   Procedure: RIGHT HALLUS  AMPUTATION ;  Surgeon: [name], MD;  Location: [facility];  Service: Podiatry;  Laterality: Right;  . CARDIAC CATHETERIZATION  11-25-2010   [place], [place]   Nonobstructive CAD  . CARDIAC PACEMAKER PLACEMENT  Nov 2009   [employer]  . CHOLECYSTECTOMY N/A 03/05/2017   Procedure: LAPAROSCOPIC CHOLECYSTECTOMY WITH INTRAOPERATIVE CHOLANGIOGRAM;  Surgeon: [name], MD;  [facility];  Service: General;  Laterality: N/A;  . EP IMPLANTABLE DEVICE N/A 06/03/2015   Procedure: PPM Generator Changeout;  Surgeon: [name], MD;  Location: [facility];  Service: Cardiovascular;  Laterality: N/A;  . EXCISION BONE CYST Right 03/06/2015   Procedure: BONE BIOPSIES OF RIGHT FOOT;  Surgeon: [name], MD;  Location: [facility];  Service: Podiatry;  Laterality: Right;  . ORIF ANKLE FRACTURE Left 11/06/2014   Procedure: OPEN REDUCTION INTERNAL FIXATION (ORIF) LEFT  ANKLE FRACTURE;  Surgeon: [name], MD;  Location: [facility];  Service: Orthopedics;  Laterality: Left;  . TEE WITHOUT CARDIOVERSION N/A 09/01/2017   Procedure: TRANSESOPHAGEAL ECHOCARDIOGRAM (TEE);  Surgeon: [name], MD;  Location: [facility];  Service: Cardiovascular;  Laterality: N/A;  . TOTAL KNEE ARTHROPLASTY    . VEIN LIGATION AND  STRIPPING      There were no vitals filed for this visit.  Subjective Assessment - 10/24/17 1450    Subjective  Doing exercises with the band. and sit to stand. Did not recall some of the other exercises he has been given.     Patient is accompained by:  Family member    Pertinent History  HTN, morbid obesity, CKD, cardiomyopathy, PPM, macular degeneration, CAD, cardiac arrest in hospital, DM, diabetic neuropathy; PVD, Rt 1-2 toe amputations, partial rt 3rd toe amputation, lt ankle ORIF 6/16    Limitations  Walking    How long can you walk comfortably?  about 10 minutes    Patient Stated Goals  get back to using my cane; feeding the  birds    Currently in Pain?  No/denies    Pain Onset  More than a month ago         Treatment- Pre-gait and gait training with and without SPC. Repetitive task to encourage  foot clearance and progress to heel strike (heel onto foam bubbles; toe taps onto cones while standing on a compliant surface; progressing to walking on red mat while tapping cone after each step.   Neuro re-ed- SLS at counter with less and less UE support for up to 10 sec each leg x 3; tandem walking; backwards walking; corner compliant surface EO head turns x 10 horiz and vertical;   Ther-ex-attempted standing heel raises with pt only able to slightly elevate heels off the floor; repeated in sitting alternating with DF stretch                     PT Short Term Goals - 09/18/17 1934      PT SHORT TERM GOAL #1   Title  Patient will complete HEP with supervision of family to assure proper technique. (Target for all STGs 11/01/2017--date reflects 4 weeks after 1st treatment 10/03/17)    Time  4    Period  Weeks    Status  New    Target Date  11/01/17      PT SHORT TERM GOAL #2   Title  Patient will improve TUG normal to <13.5 seconds with SPC to demonstrate lesser fall risk.     Time  4    Period  Weeks    Status  New      PT SHORT TERM GOAL #3   Title  Patient will improve Berg score >=44/56 demonstrating adequate balance for use of cane indoors.     Time  4    Period  Weeks    Status  New      PT SHORT TERM GOAL #4   Title  Patient can verbalize signs/symptoms of CVA, appropriate actions to take, and understanding of need to get to hospital within 3 hours of onset of symptoms    Time  4    Period  Weeks        PT Long Term Goals - 09/18/17 1946      PT LONG TERM GOAL #1   Title  Patient will be independent with HEP (using handouts) for balance and strengthening. (Target for all LTG's 12/02/2017--based on late start date)    Time  8    Period  Weeks    Status  New    Target Date   12/02/17      PT LONG TERM GOAL #2   Title  Patient will improve gait velocity to >=2.62 ft/sec with least restrictive  assistive device (indicative of velocity for safe community ambulation)    Time  8    Period  Weeks    Status  New      PT LONG TERM GOAL #3   Title  Patient will improve Berg to >=47/56 indicative of safe use of cane outdoors.     Time  8    Period  Weeks    Status  New      PT LONG TERM GOAL #4   Title  Patient able to ambulate >= 200 ft outdoors with cane modified independent over unlevel ground to allow safe walking to his bird feeders in his back yard.     Time  8    Period  Weeks    Status  New            Plan - 10/24/17 2157    Clinical Impression Statement  Patient and wife together able to state s/s of CVA and appropriate response if CVA suspected. Pateint did surprisingly well with exercises on compliant/moving surfaces with EO. Also did well in the kitchen bending to reach items on the lowest rack and then putting them away in upper cabinets. Anticipate pt is getting close to his baseline with mobiltiy deficits (he used a cane prior to recent CVA). Continue to update HEP as needed.     Rehab Potential  Good    Clinical Impairments Affecting Rehab Potential  - Multiple co-morbidities; + very motivated    PT Frequency  2x / week    PT Duration  8 weeks    PT Treatment/Interventions  ADLs/Self Care Home Management;Aquatic Therapy;Electrical Stimulation;Gait training;DME Instruction;Stair training;Functional mobility training;Therapeutic activities;Therapeutic exercise;Balance training;Neuromuscular re-education;Cognitive remediation;Manual techniques;Orthotic Fit/Training;Patient/family education;Passive range of motion    PT Next Visit Plan  continue to address strengthening (esp LLE) and balance (lacks sensation and limited lt ankle ROM due to prior injury); ?try modified PWR ex''s at counter    PT Home Exercise Plan  reports no HEP given on CIR, wife  confirms    Consulted and Agree with Plan of Care  Patient;Family member/caregiver    Family Member Consulted  wife       Patient will benefit from skilled therapeutic intervention in order to improve the following deficits and impairments:  Abnormal gait, Decreased activity tolerance, Decreased balance, Decreased cognition, Decreased mobility, Decreased knowledge of use of DME, Decreased strength, Increased edema, Impaired sensation, Postural dysfunction, Impaired UE functional use, Obesity  Visit Diagnosis: Muscle weakness (generalized)  Other symptoms and signs involving the nervous system  Other abnormalities of gait and mobility     Problem List Patient Active Problem List   Diagnosis Date Noted  . CKD (chronic kidney disease) stage 4, GFR 15-29 ml/min (HCC) 09/26/2017  . 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 Diastolic dysfunction   . Coronary artery disease involving native coronary artery of native heart without angina pectoris   . Cardiac pacemaker in situ   . Atrial fibrillation with rapid ventricular response ([place])   . PVD (peripheral vascular disease) ([place])   . Lower extremity edema 04/21/2017  . Anxiety 03/22/2017  . Acute cholecystitis s/p lap cholecystectomy 03/05/2017 03/04/2017  . HOCM (hypertrophic obstructive cardiomyopathy) ([place]) 03/04/2017  . 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 Fatigue 01/27/2017  . Low vitamin B12 level 01/27/2017  . Syncope 07/24/2015  . 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 OSA on CPAP  10/26/2014  . Back pain 10/16/2014  . Seasonal and perennial allergic rhinitis 10/25/2013  . Hypertension associated with diabetes ([place]) 10/25/2013  . Hyperlipidemia associated with type 2 diabetes mellitus ([place]) 10/25/2013  . Morbid obesity ([place]) 10/25/2013    Rexanne Mano, PT 10/24/2017, 10:09 PM  [facility] [address] [place], [name], [address] Phone: [phone]   Fax:  [phone]  Name: David [name] MRN: [id number] Date of Birth: 1941/05/13

## 2017-10-24 NOTE — Therapy (Signed)
[facility] [address] [place], [name], [address] Phone: [phone]   Fax:  [phone]  Occupational Therapy Treatment  Patient Details  Name: David [name] MRN: [id number] Date of Birth: Jul 17, 1940 Referring Provider: Dr. Naaman Plummer   Encounter Date: 10/24/2017  OT End of Session - 10/24/17 1552    Visit Number  6    Number of Visits  17    Date for OT Re-Evaluation  11/18/17    Authorization Type  Medicare/ [employer]    OT Start Time  1530    OT Stop Time  1615    OT Time Calculation (min)  45 min    Activity Tolerance  Patient tolerated treatment well    Behavior During Therapy  [facility] for tasks assessed/performed       Past Medical History:  Diagnosis Date  . Anemia, iron deficiency   . Anxiety   . Arthritis   . BPH (benign prostatic hypertrophy)   . CAD (coronary artery disease)    Nonobstructive CAD per cath  . Cardiac pacemaker in situ   . CHF (congestive heart failure) ([place])   . Chronic ulcer of right foot ([place])   . CKD (chronic kidney disease), stage III ([place]) secondary to DM and HTN   nephrologist-  [name]  . Dyspnea   . History of cellulitis    right great toe 10-25-2014  . History of skin cancer   . HOCM (hypertrophic obstructive cardiomyopathy) ([place])   . Hypertension   . Insulin dependent type 2 diabetes mellitus ([place]) 1991   followd by [name]--  has insulin pump  . Insulin pump in place   . OSA on CPAP   . Peripheral neuropathy    severe  . Peripheral vascular disease ([place])    bilateral lower extremities  . Rib fracture 07/24/2015  . Secondary hyperparathyroidism of renal origin ([place])   . Sinus node dysfunction (H[facility])   . Sleep apnea     Past Surgical History:  Procedure Laterality Date  . AMPUTATION OF REPLICATED TOES  Mar 4401   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)  . AMPUTATION TOE Right 03/12/2015   Procedure: RIGHT HALLUS AMPUTATION ;  Surgeon: [name], MD;  Location:  [facility];  Service: Podiatry;  Laterality: Right;  . CARDIAC CATHETERIZATION  11-25-2010   [place], [place]   Nonobstructive CAD  . CARDIAC PACEMAKER PLACEMENT  Nov 2009   [employer]  . CHOLECYSTECTOMY N/A 03/05/2017   Procedure: LAPAROSCOPIC CHOLECYSTECTOMY WITH INTRAOPERATIVE CHOLANGIOGRAM;  Surgeon: [name], MD;  [facility];  Service: General;  Laterality: N/A;  . EP IMPLANTABLE DEVICE N/A 06/03/2015   Procedure: PPM Generator Changeout;  Surgeon: [name], MD;  Location: [facility];  Service: Cardiovascular;  Laterality: N/A;  . EXCISION BONE CYST Right 03/06/2015   Procedure: BONE BIOPSIES OF RIGHT FOOT;  Surgeon: [name], MD;  Location: [facility];  Service: Podiatry;  Laterality: Right;  . ORIF ANKLE FRACTURE Left 11/06/2014   Procedure: OPEN REDUCTION INTERNAL FIXATION (ORIF) LEFT  ANKLE FRACTURE;  Surgeon: [name], MD;  Location: [facility];  Service: Orthopedics;  Laterality: Left;  . TEE WITHOUT CARDIOVERSION N/A 09/01/2017   Procedure: TRANSESOPHAGEAL ECHOCARDIOGRAM (TEE);  Surgeon: [name], MD;  Location: [facility];  Service: Cardiovascular;  Laterality: N/A;  . TOTAL KNEE ARTHROPLASTY    . VEIN LIGATION AND STRIPPING      There were no vitals filed  for this visit.  Subjective Assessment - 10/24/17 1648    Subjective   Pt reports perfroming simple familiar cooking at home.    Pertinent History  .PMH:CVA, history of type 2 diabetes mellitus with neuropathy, CAD, cardiac arrest in hospital, R toe amputations, CKD stage III, age-related macular degeneration    Patient Stated Goals  to get back to prior functional level    Currently in Pain?  No/denies          Treatment: Ambulating while performing for each letter of the alphabet, 3 rest breaks required, mod v.c for category generation. Completing a 12 piece puzzle with only min difficulty/ v.c Word search for attention and scanning,  min v.c, therapist issued as HEP.                    OT Short Term Goals - 10/24/17 1538      OT SHORT TERM GOAL #1   Title  I with HEP for UE strength    Status  Achieved      OT SHORT TERM GOAL #2   Title  Pt will verbalize understanding of compensatory strategies for short term memory deficits.    Status  Achieved      OT SHORT TERM GOAL #3   Title  Pt will perform basic home management/ cooking with supervision and no LOB demonstrating good safety awareness.    Time  4    Period  Weeks    Status  Achieved      OT SHORT TERM GOAL #4   Title  Pt demonstrate selective attention to a functional task  in a busy environment x 20 without redirection.    Status  Achieved      OT SHORT TERM GOAL #5   Title  Further assess cogntion and set additional goals prn    Status  Deferred        OT Long Term Goals - 10/24/17 1539      OT LONG TERM GOAL #1   Title  Pt will perform home managment and cooking activities modified independently demonstrating good safety awareness and no LOB.    Status  On-going      OT LONG TERM GOAL #2   Title  Pt will demonstrate ability to perfrom a physical and cogntive task simultaneously with 90% or better accuracy.    Status  On-going      OT LONG TERM GOAL #3   Title  Pt will perform basic money exchange for functional setting with 90% or greater accuracy    Status  Deferred            Plan - 10/24/17 1546    Clinical Impression Statement  Pt is progressing towards goals. Pt reports cooking simple familiar task at home    Occupational performance deficits (Please refer to evaluation for details):  ADL's;IADL's;Social Participation    Rehab Potential  Good    Current Impairments/barriers affecting progress:  cogntive deficits, toe amputations, neuropathy affecting mobility    OT Frequency  2x / week    OT Duration  8 weeks    OT Treatment/Interventions  Self-care/ADL training;Therapeutic exercise;Visual/perceptual  remediation/compensation;Patient/family education;Neuromuscular education;Therapeutic activities;Functional Mobility Training;Energy conservation;Cryotherapy;Ultrasound;Passive range of motion;Cognitive remediation/compensation;Manual Therapy;DME and/or AE instruction;Fluidtherapy;Moist Heat;Paraffin    Plan  continue to address attention, problem solving through functional activity, multi tasking    Consulted and Agree with Plan of Care  Patient;Family member/caregiver       Patient will benefit from skilled therapeutic  intervention in order to improve the following deficits and impairments:  Abnormal gait, Decreased cognition, Decreased knowledge of use of DME, Impaired flexibility, Impaired vision/preception, Decreased mobility, Decreased activity tolerance, Decreased endurance, Impaired UE functional use, Difficulty walking, Decreased safety awareness, Decreased knowledge of precautions, Decreased balance  Visit Diagnosis: Muscle weakness (generalized)  Cognitive social or emotional deficit following cerebral infarction  Other symptoms and signs involving the nervous system    Problem List Patient Active Problem List   Diagnosis Date Noted  . CKD (chronic kidney disease) stage 4, GFR 15-29 ml/min (HCC) 09/26/2017  . 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 Diastolic dysfunction   . Coronary artery disease involving native coronary artery of native heart without angina pectoris   . Cardiac pacemaker in situ   . Atrial fibrillation with rapid ventricular response ([place])   . PVD (peripheral vascular disease) ([place])   . Lower extremity edema 04/21/2017  . Anxiety 03/22/2017  . Acute cholecystitis s/p lap cholecystectomy 03/05/2017 03/04/2017  . HOCM (hypertrophic obstructive cardiomyopathy) ([place]) 03/04/2017  . 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 Fatigue 01/27/2017  . Low vitamin B12 level 01/27/2017  .  Syncope 07/24/2015  . 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 OSA on CPAP 10/26/2014  . Back pain 10/16/2014  . Seasonal and perennial allergic rhinitis 10/25/2013  . Hypertension associated with diabetes ([place]) 10/25/2013  . Hyperlipidemia associated with type 2 diabetes mellitus ([place]) 10/25/2013  . Morbid obesity ([place]) 10/25/2013    Andrick Rust 10/24/2017, 4:51 PM  [facility] [address] [place], [name], [address] Phone: [phone]   Fax:  [phone]  Name: David [name] MRN: [id number] Date of Birth: [birth year]

## 2017-10-25 ENCOUNTER — Ambulatory Visit: Payer: Medicare Other | Admitting: Occupational Therapy

## 2017-10-25 ENCOUNTER — Ambulatory Visit: Payer: Medicare Other | Admitting: Physical Therapy

## 2017-10-25 ENCOUNTER — Encounter: Payer: Self-pay | Admitting: Physical Therapy

## 2017-10-25 DIAGNOSIS — E1122 Type 2 diabetes mellitus with diabetic chronic kidney disease: Secondary | ICD-10-CM | POA: Diagnosis not present

## 2017-10-25 DIAGNOSIS — R2689 Other abnormalities of gait and mobility: Secondary | ICD-10-CM

## 2017-10-25 DIAGNOSIS — R809 Proteinuria, unspecified: Secondary | ICD-10-CM | POA: Diagnosis not present

## 2017-10-25 DIAGNOSIS — N2581 Secondary hyperparathyroidism of renal origin: Secondary | ICD-10-CM | POA: Diagnosis not present

## 2017-10-25 DIAGNOSIS — D509 Iron deficiency anemia, unspecified: Secondary | ICD-10-CM | POA: Diagnosis not present

## 2017-10-25 DIAGNOSIS — M6281 Muscle weakness (generalized): Secondary | ICD-10-CM

## 2017-10-25 DIAGNOSIS — R29818 Other symptoms and signs involving the nervous system: Secondary | ICD-10-CM

## 2017-10-25 DIAGNOSIS — R293 Abnormal posture: Secondary | ICD-10-CM | POA: Diagnosis not present

## 2017-10-25 DIAGNOSIS — I129 Hypertensive chronic kidney disease with stage 1 through stage 4 chronic kidney disease, or unspecified chronic kidney disease: Secondary | ICD-10-CM | POA: Diagnosis not present

## 2017-10-25 DIAGNOSIS — I69315 Cognitive social or emotional deficit following cerebral infarction: Secondary | ICD-10-CM

## 2017-10-25 DIAGNOSIS — Z6834 Body mass index (BMI) 34.0-34.9, adult: Secondary | ICD-10-CM | POA: Diagnosis not present

## 2017-10-25 DIAGNOSIS — N183 Chronic kidney disease, stage 3 (moderate): Secondary | ICD-10-CM | POA: Diagnosis not present

## 2017-10-25 LAB — FRUCTOSAMINE: Fructosamine: 280 umol/L (ref 0–285)

## 2017-10-25 NOTE — Therapy (Signed)
[facility] [address] [place], [name], [address] Phone: [phone]   Fax:  [phone]  Occupational Therapy Treatment  Patient Details  Name: David [name] MRN: [id number] Date of Birth: 01-23-1941 Referring Provider: Dr. Naaman Plummer   Encounter Date: 10/25/2017  OT End of Session - 10/25/17 1219    Visit Number  7    Number of Visits  17    Date for OT Re-Evaluation  11/18/17    Authorization Type  Medicare/ [employer]    OT Start Time  1148    OT Stop Time  1230    OT Time Calculation (min)  42 min       Past Medical History:  Diagnosis Date  . Anemia, iron deficiency   . Anxiety   . Arthritis   . BPH (benign prostatic hypertrophy)   . CAD (coronary artery disease)    Nonobstructive CAD per cath  . Cardiac pacemaker in situ   . CHF (congestive heart failure) ([place])   . Chronic ulcer of right foot ([place])   . CKD (chronic kidney disease), stage III ([employer]) secondary to DM and HTN   nephrologist-  [name]  . Dyspnea   . History of cellulitis    right great toe 10-25-2014  . History of skin cancer   . HOCM (hypertrophic obstructive cardiomyopathy) ([place])   . Hypertension   . Insulin dependent type 2 diabetes mellitus ([place]) 1991   followd by [name]--  has insulin pump  . Insulin pump in place   . OSA on CPAP   . Peripheral neuropathy    severe  . Peripheral vascular disease ([place])    bilateral lower extremities  . Rib fracture 07/24/2015  . Secondary hyperparathyroidism of renal origin ([place])   . Sinus node dysfunction (H[facility])   . Sleep apnea     Past Surgical History:  Procedure Laterality Date  . AMPUTATION OF REPLICATED TOES  Mar 6962   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)  . AMPUTATION TOE Right 03/12/2015   Procedure: RIGHT HALLUS AMPUTATION ;  Surgeon: [name], MD;  Location: [facility];  Service: Podiatry;  Laterality: Right;  . CARDIAC CATHETERIZATION  11-25-2010    [place], [place]   Nonobstructive CAD  . CARDIAC PACEMAKER PLACEMENT  Nov 2009   [employer]  . CHOLECYSTECTOMY N/A 03/05/2017   Procedure: LAPAROSCOPIC CHOLECYSTECTOMY WITH INTRAOPERATIVE CHOLANGIOGRAM;  Surgeon: [name], MD;  [facility];  Service: General;  Laterality: N/A;  . EP IMPLANTABLE DEVICE N/A 06/03/2015   Procedure: PPM Generator Changeout;  Surgeon: [name], MD;  Location: [facility];  Service: Cardiovascular;  Laterality: N/A;  . EXCISION BONE CYST Right 03/06/2015   Procedure: BONE BIOPSIES OF RIGHT FOOT;  Surgeon: [name], MD;  Location: [facility];  Service: Podiatry;  Laterality: Right;  . ORIF ANKLE FRACTURE Left 11/06/2014   Procedure: OPEN REDUCTION INTERNAL FIXATION (ORIF) LEFT  ANKLE FRACTURE;  Surgeon: [name], MD;  Location: [facility];  Service: Orthopedics;  Laterality: Left;  . TEE WITHOUT CARDIOVERSION N/A 09/01/2017   Procedure: TRANSESOPHAGEAL ECHOCARDIOGRAM (TEE);  Surgeon: [name], MD;  Location: [facility];  Service: Cardiovascular;  Laterality: N/A;  . TOTAL KNEE ARTHROPLASTY    . VEIN LIGATION AND STRIPPING      There were no vitals filed for this visit.  Subjective Assessment - 10/25/17 1223    Patient Stated Goals  to get back to prior  functional level    Currently in Pain?  No/denies            Treatment: Upgraded previous theraband HEP to green for increased strength and resistance, min v.c, 15 reps each exercise with bilateral UE's . Activity on I-pad to address short term emory and attention, min difficulty. Standing to copy small peg design with LUE for standing tolerance with a cognitive component, pt omitted several pegs as he did no use and organized strategy for completing task. Therapist reviewed with pt / wife. Arm bike x 5 mins level 4 for conditioning.                 OT Short Term Goals - 10/25/17 1222      OT SHORT TERM GOAL #1   Title  I  with HEP for UE strength    Status  Achieved      OT SHORT TERM GOAL #2   Title  Pt will verbalize understanding of compensatory strategies for short term memory deficits.    Status  Achieved      OT SHORT TERM GOAL #3   Title  Pt will perform basic home management/ cooking with supervision and no LOB demonstrating good safety awareness.    Time  4    Period  Weeks    Status  Achieved      OT SHORT TERM GOAL #4   Title  Pt demonstrate selective attention to a functional task  in a busy environment x 20 without redirection.    Status  Achieved      OT SHORT TERM GOAL #5   Title  Further assess cogntion and set additional goals prn    Status  Deferred        OT Long Term Goals - 10/24/17 1539      OT LONG TERM GOAL #1   Title  Pt will perform home managment and cooking activities modified independently demonstrating good safety awareness and no LOB.    Status  On-going      OT LONG TERM GOAL #2   Title  Pt will demonstrate ability to perfrom a physical and cogntive task simultaneously with 90% or better accuracy.    Status  On-going      OT LONG TERM GOAL #3   Title  Pt will perform basic money exchange for functional setting with 90% or greater accuracy    Status  Deferred            Plan - 10/25/17 1220    Clinical Impression Statement  Pt is progressing towards goals. Pt demonstrates understanding of updated HEP.    Occupational Profile and client history currently impacting functional performance  Prior to CVA pt was modified independent with all basic ADLS, IADLS and ambulating with a cane, he enjoys feeding the birds.PMH:CVA, history of type 2 diabetes mellitus with neuropathy, CAD, cardiac arrest in hospital, R toe amputations, CKD stage III, age-related macular degeneration    Occupational performance deficits (Please refer to evaluation for details):  ADL's;IADL's;Social Participation    Rehab Potential  Good    Current Impairments/barriers affecting progress:   cogntive deficits, toe amputations, neuropathy affecting mobility    OT Frequency  2x / week    OT Duration  8 weeks    OT Treatment/Interventions  Self-care/ADL training;Therapeutic exercise;Visual/perceptual remediation/compensation;Patient/family education;Neuromuscular education;Therapeutic activities;Functional Mobility Training;Energy conservation;Cryotherapy;Ultrasound;Passive range of motion;Cognitive remediation/compensation;Manual Therapy;DME and/or AE instruction;Fluidtherapy;Moist Heat;Paraffin    Plan  multi tasking, activities to address strength and endurance  for home management    Consulted and Agree with Plan of Care  Patient;Family member/caregiver       Patient will benefit from skilled therapeutic intervention in order to improve the following deficits and impairments:  Abnormal gait, Decreased cognition, Decreased knowledge of use of DME, Impaired flexibility, Impaired vision/preception, Decreased mobility, Decreased activity tolerance, Decreased endurance, Impaired UE functional use, Difficulty walking, Decreased safety awareness, Decreased knowledge of precautions, Decreased balance  Visit Diagnosis: Muscle weakness (generalized)  Other symptoms and signs involving the nervous system  Cognitive social or emotional deficit following cerebral infarction  Abnormal posture    Problem List Patient Active Problem List   Diagnosis Date Noted  . CKD (chronic kidney disease) stage 4, GFR 15-29 ml/min (HCC) 09/26/2017  . 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 Diastolic dysfunction   . Coronary artery disease involving native coronary artery of native heart without angina pectoris   . Cardiac pacemaker in situ   . Atrial fibrillation with rapid ventricular response ([place])   . PVD (peripheral vascular disease) ([place])   . Lower extremity edema 04/21/2017  . Anxiety 03/22/2017  . Acute cholecystitis s/p lap cholecystectomy  03/05/2017 03/04/2017  . HOCM (hypertrophic obstructive cardiomyopathy) ([place]) 03/04/2017  . 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 Fatigue 01/27/2017  . Low vitamin B12 level 01/27/2017  . Syncope 07/24/2015  . 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 OSA on CPAP 10/26/2014  . Back pain 10/16/2014  . Seasonal and perennial allergic rhinitis 10/25/2013  . Hypertension associated with diabetes ([place]) 10/25/2013  . Hyperlipidemia associated with type 2 diabetes mellitus ([place]) 10/25/2013  . Morbid obesity ([place]) 10/25/2013    RINE,KATHRYN 10/25/2017, 12:29 PM  [facility] [address] [place], [name], [address] Phone: [phone]   Fax:  [phone]  Name: David [name] MRN: [id number] Date of Birth: [birth year]

## 2017-10-25 NOTE — Patient Instructions (Signed)
Bridging (Single Leg)    Lie on back with feet shoulder width apart and right leg straight. Lift hips toward the ceiling while keeping leg straight. Count out loud __3-5__ seconds.  Repeat __10__ times. Do __2 sets.

## 2017-10-25 NOTE — Therapy (Signed)
[facility] [address] [place], [name], [address] Phone: [phone]   Fax:  [phone]  Physical Therapy Treatment  Patient Details  Name: David [name] MRN: [id number] Date of Birth: 02/20/1941 Referring Provider: Dr. Naaman Plummer   Encounter Date: 10/25/2017  PT End of Session - 10/25/17 1201    Visit Number  7    Number of Visits  17    Date for PT Re-Evaluation  12/17/17    Authorization Type  Medicare; [employer]    Authorization Time Period  09/18/17 to 12/17/17    PT Start Time  1106    PT Stop Time  1145    PT Time Calculation (min)  39 min    Activity Tolerance  Patient tolerated treatment well    Behavior During Therapy  [facility] for tasks assessed/performed       Past Medical History:  Diagnosis Date  . Anemia, iron deficiency   . Anxiety   . Arthritis   . BPH (benign prostatic hypertrophy)   . CAD (coronary artery disease)    Nonobstructive CAD per cath  . Cardiac pacemaker in situ   . CHF (congestive heart failure) ([place])   . Chronic ulcer of right foot ([place])   . CKD (chronic kidney disease), stage III ([place]) secondary to DM and HTN   nephrologist-  [name]  . Dyspnea   . History of cellulitis    right great toe 10-25-2014  . History of skin cancer   . HOCM (hypertrophic obstructive cardiomyopathy) ([place])   . Hypertension   . Insulin dependent type 2 diabetes mellitus ([place]) 1991   followd by [name]--  has insulin pump  . Insulin pump in place   . OSA on CPAP   . Peripheral neuropathy    severe  . Peripheral vascular disease ([place])    bilateral lower extremities  . Rib fracture 07/24/2015  . Secondary hyperparathyroidism of renal origin ([place])   . Sinus node dysfunction (H[facility])   . Sleep apnea     Past Surgical History:  Procedure Laterality Date  . AMPUTATION OF REPLICATED TOES  Mar 4315   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)  . AMPUTATION TOE Right 03/12/2015   Procedure: RIGHT HALLUS  AMPUTATION ;  Surgeon: [name], MD;  Location: [facility];  Service: Podiatry;  Laterality: Right;  . CARDIAC CATHETERIZATION  11-25-2010   [place], [place]   Nonobstructive CAD  . CARDIAC PACEMAKER PLACEMENT  Nov 2009   [employer]  . CHOLECYSTECTOMY N/A 03/05/2017   Procedure: LAPAROSCOPIC CHOLECYSTECTOMY WITH INTRAOPERATIVE CHOLANGIOGRAM;  Surgeon: [name], MD;  [facility];  Service: General;  Laterality: N/A;  . EP IMPLANTABLE DEVICE N/A 06/03/2015   Procedure: PPM Generator Changeout;  Surgeon: [name], MD;  Location: [facility];  Service: Cardiovascular;  Laterality: N/A;  . EXCISION BONE CYST Right 03/06/2015   Procedure: BONE BIOPSIES OF RIGHT FOOT;  Surgeon: [name], MD;  Location: [facility];  Service: Podiatry;  Laterality: Right;  . ORIF ANKLE FRACTURE Left 11/06/2014   Procedure: OPEN REDUCTION INTERNAL FIXATION (ORIF) LEFT  ANKLE FRACTURE;  Surgeon: [name], MD;  Location: [facility];  Service: Orthopedics;  Laterality: Left;  . TEE WITHOUT CARDIOVERSION N/A 09/01/2017   Procedure: TRANSESOPHAGEAL ECHOCARDIOGRAM (TEE);  Surgeon: [name], MD;  Location: [facility];  Service: Cardiovascular;  Laterality: N/A;  . TOTAL KNEE ARTHROPLASTY    . VEIN LIGATION AND  STRIPPING      There were no vitals filed for this visit.  Subjective Assessment - 10/25/17 1152    Subjective  Knees might be tolerating exercise a little better. Reports he did NOT use a cane after his ankle fracture (just began after this stroke).     Patient is accompained by:  Family member    Pertinent History  HTN, morbid obesity, CKD, cardiomyopathy, PPM, macular degeneration, CAD, cardiac arrest in hospital, DM, diabetic neuropathy; PVD, Rt 1-2 toe amputations, partial rt 3rd toe amputation, lt ankle ORIF 6/16    Limitations  Walking    How long can you walk comfortably?  about 10 minutes    Patient Stated Goals  get back to  walking without a cane; feeding the birds in his back yard (filling feeders)    Currently in Pain?  No/denies    Pain Onset  --                       [facility] Adult PT Treatment/Exercise - 10/25/17 1153      Ambulation/Gait   Ambulation/Gait Assistance  6: Modified independent (Device/Increase time)    Ambulation Distance (Feet)  80 Feet plus moving around gym during session    Assistive device  Straight cane    Gait Pattern  Step-through pattern;Decreased step length - right;Decreased stance time - left;Wide base of support    Ambulation Surface  Indoor      Knee/Hip Exercises: Standing   Forward Step Up  Right;Left;1 set;10 reps;Hand Hold: 1;Step Height: 6" focus on knee control at end range extension      Knee/Hip Exercises: Supine   Quad Sets  Strengthening;Left;2 sets;10 reps prior to SLR, each time     Single Leg Bridge  Strengthening;Left;2 sets;10 reps 3 sec hold    Straight Leg Raises  Strengthening;Left;2 sets;10 reps no weight; 4# ankle wt          Balance Exercises - 10/25/17 1157      Balance Exercises: Standing   Wall Bumps  Hip    Wall Bumps-Hips  Eyes opened;Eyes closed;Anterior/posterior;Foam/compliant surface;10 reps;5 reps significant incr difficulty with EC; on airex foam    Balance Beam  blue beam stance leg with opp leg gently tipping over a cone, side step on foam to next cone x 8 reps (4 lt, 4 rt) then same reps for returning cones to upright using one foot with stance leg on blue beam    Other Standing Exercises  step taps on blue airex (alternating taps on 4" step in front of airex        PT Education - 10/25/17 1201    Education Details  addition to HEP (single leg bridge)    Person(s) Educated  Patient;Spouse    Methods  Explanation;Demonstration;Verbal cues;Handout    Comprehension  Verbalized understanding;Returned demonstration;Verbal cues required;Need further instruction       PT Short Term Goals - 09/18/17 1934      PT  SHORT TERM GOAL #1   Title  Patient will complete HEP with supervision of family to assure proper technique. (Target for all STGs 11/01/2017--date reflects 4 weeks after 1st treatment 10/03/17)    Time  4    Period  Weeks    Status  New    Target Date  11/01/17      PT SHORT TERM GOAL #2   Title  Patient will improve TUG normal to <13.5 seconds with SPC to demonstrate lesser fall  risk.     Time  4    Period  Weeks    Status  New      PT SHORT TERM GOAL #3   Title  Patient will improve Berg score >=44/56 demonstrating adequate balance for use of cane indoors.     Time  4    Period  Weeks    Status  New      PT SHORT TERM GOAL #4   Title  Patient can verbalize signs/symptoms of CVA, appropriate actions to take, and understanding of need to get to hospital within 3 hours of onset of symptoms    Time  4    Period  Weeks        PT Long Term Goals - 09/18/17 1946      PT LONG TERM GOAL #1   Title  Patient will be independent with HEP (using handouts) for balance and strengthening. (Target for all LTG's 12/02/2017--based on late start date)    Time  8    Period  Weeks    Status  New    Target Date  12/02/17      PT LONG TERM GOAL #2   Title  Patient will improve gait velocity to >=2.62 ft/sec with least restrictive assistive device (indicative of velocity for safe community ambulation)    Time  8    Period  Weeks    Status  New      PT LONG TERM GOAL #3   Title  Patient will improve Berg to >=47/56 indicative of safe use of cane outdoors.     Time  8    Period  Weeks    Status  New      PT LONG TERM GOAL #4   Title  Patient able to ambulate >= 200 ft outdoors with cane modified independent over unlevel ground to allow safe walking to his bird feeders in his back yard.     Time  8    Period  Weeks    Status  New            Plan - 10/25/17 1202    Clinical Impression Statement  Patient continues to require close placement next to walls and sometimes use of cane for  balance activities on foam. Tolerated strengthening exercises with less rest today. Continues to progress towards STGs (TBA by 6/19)    Rehab Potential  Good    Clinical Impairments Affecting Rehab Potential  - Multiple co-morbidities; + very motivated    PT Frequency  2x / week    PT Duration  8 weeks    PT Treatment/Interventions  ADLs/Self Care Home Management;Aquatic Therapy;Electrical Stimulation;Gait training;DME Instruction;Stair training;Functional mobility training;Therapeutic activities;Therapeutic exercise;Balance training;Neuromuscular re-education;Cognitive remediation;Manual techniques;Orthotic Fit/Training;Patient/family education;Passive range of motion    PT Next Visit Plan  continue to address strengthening (esp LLE) and balance (lacks sensation and limited lt ankle ROM due to prior injury); his goal is to return to not using his cane     PT Home Exercise Plan  reports no HEP given on CIR, wife confirms    Consulted and Agree with Plan of Care  Patient;Family member/caregiver    Family Member Consulted  wife       Patient will benefit from skilled therapeutic intervention in order to improve the following deficits and impairments:  Abnormal gait, Decreased activity tolerance, Decreased balance, Decreased cognition, Decreased mobility, Decreased knowledge of use of DME, Decreased strength, Increased edema, Impaired sensation, Postural dysfunction, Impaired UE functional  use, Obesity  Visit Diagnosis: Muscle weakness (generalized)  Other abnormalities of gait and mobility  Other symptoms and signs involving the nervous system     Problem List Patient Active Problem List   Diagnosis Date Noted  . CKD (chronic kidney disease) stage 4, GFR 15-29 ml/min (HCC) 09/26/2017  . 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 Diastolic dysfunction   . Coronary artery disease involving native coronary artery of native heart without angina  pectoris   . Cardiac pacemaker in situ   . Atrial fibrillation with rapid ventricular response ([place])   . PVD (peripheral vascular disease) ([place])   . Lower extremity edema 04/21/2017  . Anxiety 03/22/2017  . Acute cholecystitis s/p lap cholecystectomy 03/05/2017 03/04/2017  . HOCM (hypertrophic obstructive cardiomyopathy) ([place]) 03/04/2017  . 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 Fatigue 01/27/2017  . Low vitamin B12 level 01/27/2017  . Syncope 07/24/2015  . 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 OSA on CPAP 10/26/2014  . Back pain 10/16/2014  . Seasonal and perennial allergic rhinitis 10/25/2013  . Hypertension associated with diabetes ([place]) 10/25/2013  . Hyperlipidemia associated with type 2 diabetes mellitus ([place]) 10/25/2013  . Morbid obesity ([place]) 10/25/2013    Rexanne Mano, PT 10/25/2017, 12:07 PM  [facility] [address] [place], [name], [address] Phone: [phone]   Fax:  [phone]  Name: KYSON KUPPER MRN: [id number] Date of Birth: December 19, 1940

## 2017-10-27 ENCOUNTER — Ambulatory Visit (INDEPENDENT_AMBULATORY_CARE_PROVIDER_SITE_OTHER): Payer: Medicare Other | Admitting: Endocrinology

## 2017-10-27 ENCOUNTER — Encounter: Payer: Self-pay | Admitting: Endocrinology

## 2017-10-27 VITALS — BP 132/58 | HR 64 | Ht 76.0 in | Wt 281.6 lb

## 2017-10-27 DIAGNOSIS — Z794 Long term (current) use of insulin: Secondary | ICD-10-CM | POA: Diagnosis not present

## 2017-10-27 DIAGNOSIS — E782 Mixed hyperlipidemia: Secondary | ICD-10-CM

## 2017-10-27 DIAGNOSIS — I639 Cerebral infarction, unspecified: Secondary | ICD-10-CM

## 2017-10-27 DIAGNOSIS — E1165 Type 2 diabetes mellitus with hyperglycemia: Secondary | ICD-10-CM | POA: Diagnosis not present

## 2017-10-27 NOTE — Progress Notes (Signed)
Patient ID: David [name], male   DOB: [birth year], 77 y.o.   MRN: [id number]    Reason for Appointment: Followup for Type 2 Diabetes  History of Present Illness:          Diagnosis: Type 2 diabetes mellitus, date of diagnosis:   1992       Past history:  He was initially treated with metformin and at some point also glipizide. His previous records from out of town are not available and no information is available about his level of control Apparently he was started on insulin in 1994 approximately because of poor control He has been on various insulin regimens over the last several years However even with insulin he has had poor control for at least the last 7 or 8 years. He does not know what his previous A1c levels have been. He had been continued on metformin and glipizide but metformin stopped because of kidney function abnormality He had been taking Lantus 60 units twice a day with NovoLog previously and also Before his initial consultation in 6/15 he was on NovoLog twice a day and Humalog mix insulin  Because of poor control and large insulin doses he was started on Victoza in 6/15  Recent history:   [employer]tr[employer] PUMP  regimen with U-500 insulin is as follows BASAL rates:Midnight = 0.55, 6 AM = 0.8, 1 PM = 0.85, 9 PM = 0.60    I/C ratio: 10, ISF: 45, target 120-150.  Active insulin 6 hours  Since 04/28/14 he had been on a Medtronic insulin pump because of persistent poor control and high insulin requirement  Has had A1c's of mostly over 8% but even though it had been better at 7.6 it is now 8.3 However his fructosamine is only 280  Current management, problems and blood sugar patterns:  He was started back on his insulin pump about 4 weeks ago because of poor control on increasing doses of Lantus and NovoLog  His daughter has been helping him work the pump and also supervising his monitoring  Because of blood sugars being fairly good after meals without any  boluses and occasional low blood sugars after boluses he was told not to bolus  However his family is also helping him try to watch his diet and avoid high fat foods, excessive snacks which he was previously doing significantly  Also on his last visit because he was started which she had not restarted after his hospital discharge  He has done well with taking this every morning at 1.2 mg doses and has no nausea from this  Subsequently has had no hypoglycemia  However his blood sugars have been periodically higher but this week they are appearing to be better most of the time except a couple of mornings that he was high  Again has not taken any boluses  Not clear why his blood sugar was over 400 about a week ago in the evening and may have been related to an infusion set issues since blood sugars did not come down for a couple of days subsequently  Blood sugar did come down after his infusion set change last Sunday  Currently has done the infusion site changes only about every 6 days  Will get fasting blood sugars have been as low as 70 recently  More recently has had only 1 or 2 readings that are high after his evening meal even without boluses  Non-insulin hypoglycemic drugs the patient is taking are: VICTOZA 1.2  mg daily        Glucose monitoring:  done about 4 times a day         Glucometer:  contour     Blood Glucose readings from download of meter and analysis of statistics as follows  Mean values apply above for all meters except median for One Touch  PRE-MEAL Fasting Lunch Dinner  6 P-12 AM Overall  Glucose range:  70-292  67-274  66-220  157   Mean/median:  169  170  130  76-475    POST-MEAL PC Breakfast PC Lunch PC Dinner  Glucose range:     Mean/median:       Self-care:   Meals: 2- 3 meals per day.   breakfast is cheerios, egg and toast.  Will have half sandwich with soup at 1 pm , dinner at  6-7 pm.    He thinks his portions are small but periodically eats  sweets.   Bedtime snack is usually crackers  with milk          Exercise:  Walking a little recently  Last  consultation : Most recent: 08/2017      Wt Readings from Last 3 Encounters:  10/27/17 281 lb 9.6 oz (127.7 kg)  10/03/17 272 lb 6.4 oz (123.6 kg)  09/26/17 266 lb (120.7 kg)   Glycemic control:   Lab Results  Component Value Date   HGBA1C 8.3 (H) 10/24/2017   HGBA1C 7.6 (H) 08/31/2017   HGBA1C 8.1 (H) 05/23/2017   Lab Results  Component Value Date   MICROALBUR 11.9 (H) 10/24/2017   LDLCALC 106 (H) 08/31/2017   CREATININE 2.17 (H) 10/24/2017            Lab Results  Component Value Date   FRUCTOSAMINE 280 10/24/2017   FRUCTOSAMINE 315 (H) 07/21/2017   FRUCTOSAMINE 328 (H) 08/05/2016   FRUCTOSAMINE 326 (H) 01/30/2014     Other active problems: See review of systems   Allergies as of 10/27/2017      Reactions   Adhesive [tape] Other (See Comments)   blisters      Medication List        Accurate as of 10/27/17 11:59 PM. Always use your most recent med list.          acetaminophen 325 MG tablet Commonly known as:  TYLENOL Take 1-2 tablets (325-650 mg total) by mouth every 4 (four) hours as needed for mild pain.   atorvastatin 10 MG tablet Commonly known as:  LIPITOR Take 1 tablet (10 mg total) by mouth daily at 6 PM.   calcitRIOL 0.25 MCG capsule Commonly known as:  ROCALTROL Take 1 capsule (0.25 mcg total) by mouth daily.   carvedilol 25 MG tablet Commonly known as:  COREG Take 1 tablet (25 mg total) by mouth 2 (two) times daily with a meal.   cholecalciferol 1000 units tablet Commonly known as:  VITAMIN D Take 1 tablet (1,000 Units total) by mouth daily.   clopidogrel 75 MG tablet Commonly known as:  PLAVIX Take 1 tablet (75 mg total) by mouth daily.   diclofenac sodium 1 % Gel Commonly known as:  VOLTAREN Apply 2 g topically 3 (three) times daily.   HUMULIN R 500 UNIT/ML injection Generic drug:  insulin regular human  CONCENTRATED Inject into the skin.   hydrOXYzine 50 MG tablet Commonly known as:  ATARAX/VISTARIL Take 1 tablet (50 mg total) 3 (three) times daily as needed by mouth.   isosorbide mononitrate 30 MG 24  hr tablet Commonly known as:  IMDUR Take 1 tablet (30 mg total) by mouth daily.   liraglutide 18 MG/3ML Sopn Commonly known as:  VICTOZA Inject 0.2 mLs (1.2 mg total) into the skin daily.   lisinopril 5 MG tablet Commonly known as:  PRINIVIL,ZESTRIL Take 1 tablet by mouth daily.   pantoprazole 40 MG tablet Commonly known as:  PROTONIX TAKE ONE TABLET BY MOUTH ONCE DAILY   sertraline 100 MG tablet Commonly known as:  ZOLOFT Take 100 mg by mouth daily.   torsemide 10 MG tablet Commonly known as:  DEMADEX Take 1 tablet (10 mg total) by mouth 2 (two) times daily.   vitamin B-12 100 MCG tablet Commonly known as:  CYANOCOBALAMIN Take 100 mcg by mouth daily.       Allergies:  Allergies  Allergen Reactions  . Adhesive [Tape] Other (See Comments)    blisters    Past Medical History:  Diagnosis Date  . Anemia, iron deficiency   . Anxiety   . Arthritis   . BPH (benign prostatic hypertrophy)   . CAD (coronary artery disease)    Nonobstructive CAD per cath  . Cardiac pacemaker in situ   . CHF (congestive heart failure) ([place])   . Chronic ulcer of right foot ([place])   . CKD (chronic kidney disease), stage III ([place]) secondary to DM and HTN   nephrologist-  [name]  . Dyspnea   . History of cellulitis    right great toe 10-25-2014  . History of skin cancer   . HOCM (hypertrophic obstructive cardiomyopathy) ([place])   . Hypertension   . Insulin dependent type 2 diabetes mellitus ([place]) 1991   followd by [name]--  has insulin pump  . Insulin pump in place   . OSA on CPAP   . Peripheral neuropathy    severe  . Peripheral vascular disease ([place])    bilateral lower extremities  . Rib fracture 07/24/2015  . Secondary hyperparathyroidism of renal origin ([place])   . Sinus  node dysfunction ([facility])   . Sleep apnea     Past Surgical History:  Procedure Laterality Date  . 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 AMPUTATION TOE Right 03/12/2015   Procedure: RIGHT HALLUS AMPUTATION ;  Surgeon: [name], MD;  Location: [facility];  Service: Podiatry;  Laterality: Right;  . CARDIAC CATHETERIZATION  11-25-2010   [place], [place]   Nonobstructive CAD  . CARDIAC PACEMAKER PLACEMENT  Nov 2009   [employer]tr[employer]  . CHOLECYSTECTOMY N/A 03/05/2017   Procedure: LAPAROSCOPIC CHOLECYSTECTOMY WITH INTRAOPERATIVE CHOLANGIOGRAM;  Surgeon: [name], MD;  Location: [facility];  Service: General;  Laterality: N/A;  . EP IMPLANTABLE DEVICE N/A 06/03/2015   Procedure: PPM Generator Changeout;  Surgeon: [name], MD;  Location: [facility];  Service: Cardiovascular;  Laterality: N/A;  . EXCISION BONE CYST Right 03/06/2015   Procedure: BONE BIOPSIES OF RIGHT FOOT;  Surgeon: [name], MD;  Location: [facility];  Service: Podiatry;  Laterality: Right;  . ORIF ANKLE FRACTURE Left 11/06/2014   Procedure: OPEN REDUCTION INTERNAL FIXATION (ORIF) LEFT  ANKLE FRACTURE;  Surgeon: [name], MD;  Location: [facility];  Service: Orthopedics;  Laterality: Left;  . TEE WITHOUT CARDIOVERSION N/A 09/01/2017   Procedure: TRANSESOPHAGEAL ECHOCARDIOGRAM (TEE);  Surgeon: [name], MD;  Location: [facility];  Service: Cardiovascular;  Laterality: N/A;  . TOTAL KNEE ARTHROPLASTY    . VEIN  LIGATION AND STRIPPING      Family History  Problem Relation Age of Onset  . Cancer Mother        breast  . Heart attack Father     Social History:  reports that he quit smoking about 33 years ago. He has a 60.00 pack-year smoking history. He has quit using smokeless tobacco. He reports that he drinks about 0.6 oz of alcohol per week. He reports that he does not use drugs.    Review of Systems   Following is a  copy of previous note      Lipids: Had been on a Lipitor since late 2014. Also had taken Gembrozil for several years without apparent side effects.  Both of these were  stopped because of increased liver functions However his Lipitor apparently has been restarted after his visit with the PCP after his LDL level was high in 4/19  HDL is consistently low  He is taking  fish oil OTC  but triglycerides are high but relatively better   Lab Results  Component Value Date   CHOL 165 08/31/2017   CHOL 175 07/21/2017   CHOL 168 05/23/2017   Lab Results  Component Value Date   HDL 20 (L) 08/31/2017   HDL 25.10 (L) 07/21/2017   HDL 22.20 (L) 05/23/2017   Lab Results  Component Value Date   LDLCALC 106 (H) 08/31/2017   LDLCALC 94 02/17/2017   LDLCALC 49 10/29/2013   Lab Results  Component Value Date   TRIG 195 (H) 08/31/2017   TRIG 259.0 (H) 07/21/2017   TRIG 282.0 (H) 05/23/2017   Lab Results  Component Value Date   CHOLHDL 8.3 08/31/2017   CHOLHDL 7 07/21/2017   CHOLHDL 8 05/23/2017   Lab Results  Component Value Date   LDLDIRECT 76.0 07/21/2017   LDLDIRECT 89.0 05/23/2017   LDLDIRECT 77.0 08/05/2016    Lab Results  Component Value Date   ALT 19 09/05/2017       HYPERTENSION: The blood pressure has been treated with various drugs by his other physicians. Cardura was stopped after his hospitalization Blood pressure is somewhat variable but fairly good today  BP Readings from Last 3 Encounters:  10/27/17 (!) 132/58  10/03/17 (!) 152/74  09/26/17 136/74     Chronic kidney disease: Etiology unknown, is followed by nephrologist  Last creatinine:  Lab Results  Component Value Date   CREATININE 2.17 (H) 10/24/2017        Has history of Numbness in his feet  since about 2013  He has been wearing diabetic shoes.  He has been followed by podiatrist He is  under the care of a podiatrist      Physical Examination:  BP (!) 132/58 (BP Location: Left Arm,  Patient Position: Sitting, Cuff Size: Normal)   Pulse 64   Ht 6\' 4"  (1.93 m)   Wt 281 lb 9.6 oz (127.7 kg)   SpO2 98%   BMI 34.28 kg/m     ASSESSMENT:  Diabetes type 2, uncontrolled with obesity See history of present illness for detailed discussion of current diabetes management, blood sugar patterns and problems identified   He is on the U-500 insulin on his pump with improved control Fructosamine indicates recently good control  Again without any boluses his blood sugars are fairly good most of the time but not clear why some of his fasting readings are higher He and his wife think that he is not going off his diet as much  with family supervision Also may not be changing his infusion set adequately as he had an episode where blood sugars are higher with not changing the site for at least 6 days Postprandial reading may also be better with restarting Victoza and this is limiting his weight gain also   RENAL insufficiency: This will continue to be followed by his PCP and nephrologist  LIPIDS: Now managed by his PCP, currently on Lipitor but need to make sure his ALT does not go up again with this   PLAN:   No change in basal settings Will not institute any boluses as yet since the U-500 insulin may be helping with postprandial readings also along with Victoza Discussed importance of consistent diet He will call if he has any postprandial hyperglycemia  He will need to change his infusion set every 4 to 5 days and possibly not fill the whole cartridge He is going to try to get the freestyle libre sensor since he now is checking enough to qualify Discussed how this would be helpful and how this works, his wife also was present in the office today Repeat A1c in 2 months  Counseling time on subjects discussed in assessment and plan sections is over 50% of today's 25 minute visit   There are no Patient Instructions on file for this visit.    Elayne Snare 10/28/2017, 3:01 PM    Note: This office note was prepared with Dragon voice recognition system technology. Any transcriptional errors that result from this process are unintentional.

## 2017-10-28 ENCOUNTER — Telehealth: Payer: Self-pay | Admitting: Endocrinology

## 2017-10-28 NOTE — Telephone Encounter (Signed)
His blood sugar had gone over 400 on 10/20/2017 likely from his not changing infusion set more than once a week.  Please discuss this with his daughter, may not need to fill his whole cartridge with the U-500 insulin

## 2017-10-30 ENCOUNTER — Encounter: Payer: Self-pay | Admitting: Physical Therapy

## 2017-10-30 ENCOUNTER — Ambulatory Visit: Payer: Medicare Other | Admitting: Occupational Therapy

## 2017-10-30 ENCOUNTER — Ambulatory Visit: Payer: Medicare Other | Admitting: Physical Therapy

## 2017-10-30 VITALS — BP 88/48 | HR 71

## 2017-10-30 DIAGNOSIS — I69315 Cognitive social or emotional deficit following cerebral infarction: Secondary | ICD-10-CM | POA: Diagnosis not present

## 2017-10-30 DIAGNOSIS — R29818 Other symptoms and signs involving the nervous system: Secondary | ICD-10-CM

## 2017-10-30 DIAGNOSIS — R293 Abnormal posture: Secondary | ICD-10-CM

## 2017-10-30 DIAGNOSIS — M6281 Muscle weakness (generalized): Secondary | ICD-10-CM | POA: Diagnosis not present

## 2017-10-30 DIAGNOSIS — R2689 Other abnormalities of gait and mobility: Secondary | ICD-10-CM

## 2017-10-30 NOTE — Therapy (Signed)
[facility] [address] [place], [name], [address] Phone: [phone]   Fax:  [phone]  Physical Therapy Treatment  Patient Details  Name: David [name] MRN: [id number] Date of Birth: Sep 14, 1940 Referring Provider: Dr. Naaman Plummer   Encounter Date: 10/30/2017  PT End of Session - 10/30/17 1147    Visit Number  8    Number of Visits  17    Date for PT Re-Evaluation  12/17/17    Authorization Type  Medicare; [employer]    Authorization Time Period  09/18/17 to 12/17/17    PT Start Time  1148    PT Stop Time  1230    PT Time Calculation (min)  42 min    Activity Tolerance  Patient limited by fatigue;Treatment limited secondary to medical complications (Comment) low BP 88/48    Behavior During Therapy  WFL for tasks assessed/performed       Past Medical History:  Diagnosis Date  . Anemia, iron deficiency   . Anxiety   . Arthritis   . BPH (benign prostatic hypertrophy)   . CAD (coronary artery disease)    Nonobstructive CAD per cath  . Cardiac pacemaker in situ   . CHF (congestive heart failure) ([place])   . Chronic ulcer of right foot ([place])   . CKD (chronic kidney disease), stage III ([place]) secondary to DM and HTN   nephrologist-  [name]  . Dyspnea   . History of cellulitis    right great toe 10-25-2014  . History of skin cancer   . HOCM (hypertrophic obstructive cardiomyopathy) ([place])   . Hypertension   . Insulin dependent type 2 diabetes mellitus ([place]) 1991   followd by [name]--  has insulin pump  . Insulin pump in place   . OSA on CPAP   . Peripheral neuropathy    severe  . Peripheral vascular disease ([place])    bilateral lower extremities  . Rib fracture 07/24/2015  . Secondary hyperparathyroidism of renal origin ([place])   . Sinus node dysfunction (HCC)   . Sleep apnea     Past Surgical History:  Procedure Laterality Date  . 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 AMPUTATION TOE Right 03/12/2015   Procedure: RIGHT HALLUS AMPUTATION ;  Surgeon: [name], MD;  Location: [facility];  Service: Podiatry;  Laterality: Right;  . CARDIAC CATHETERIZATION  11-25-2010   [place], [place]   Nonobstructive CAD  . CARDIAC PACEMAKER PLACEMENT  Nov 2009   [employer]  . CHOLECYSTECTOMY N/A 03/05/2017   Procedure: LAPAROSCOPIC CHOLECYSTECTOMY WITH INTRAOPERATIVE CHOLANGIOGRAM;  Surgeon: [name], MD;  [facility];  Service: General;  Laterality: N/A;  . EP IMPLANTABLE DEVICE N/A 06/03/2015   Procedure: PPM Generator Changeout;  Surgeon: [name], MD;  Location: [facility];  Service: Cardiovascular;  Laterality: N/A;  . EXCISION BONE CYST Right 03/06/2015   Procedure: BONE BIOPSIES OF RIGHT FOOT;  Surgeon: [name], MD;  Location: [facility];  Service: Podiatry;  Laterality: Right;  . ORIF ANKLE FRACTURE Left 11/06/2014   Procedure: OPEN REDUCTION INTERNAL FIXATION (ORIF) LEFT  ANKLE FRACTURE;  Surgeon: [name], MD;  Location: [facility];  Service: Orthopedics;  Laterality: Left;  . TEE WITHOUT CARDIOVERSION N/A 09/01/2017   Procedure: TRANSESOPHAGEAL ECHOCARDIOGRAM (TEE);  Surgeon: [name], MD;  [facility];  Service: Cardiovascular;  Laterality: N/A;  . TOTAL  KNEE ARTHROPLASTY    . VEIN LIGATION AND STRIPPING      Vitals:   10/30/17 1230  BP: (!) 88/48  Pulse: 71  SpO2: 95%    Subjective Assessment - 10/30/17 1146    Subjective  A little tired from OT session. (Later notes he has not been drinking enough lately--when noted BP 88/48)    Patient is accompained by:  Family member    Pertinent History  HTN, morbid obesity, CKD, cardiomyopathy, PPM, macular degeneration, CAD, cardiac arrest in hospital, DM, diabetic neuropathy; PVD, Rt 1-2 toe amputations, partial rt 3rd toe amputation, lt ankle ORIF 6/16    Limitations  Walking    How long can you walk  comfortably?  about 10 minutes    Patient Stated Goals  get back to walking without a cane; feeding the birds in his back yard (filling feeders)    Currently in Pain?  No/denies                       [facility] Adult PT Treatment/Exercise - 10/30/17 1156      Transfers   Transfers  Sit to Stand;Stand to Sit    Sit to Stand  6: Modified independent (Device/Increase time)    Stand to Sit  6: Modified independent (Device/Increase time)    Comments  with [facility]      Ambulation/Gait   Ambulation/Gait Assistance  6: Modified independent (Device/Increase time)    Ambulation Distance (Feet)  500 Feet    Assistive device  Straight cane    Gait Pattern  Step-through pattern;Decreased step length - right;Decreased stance time - left;Wide base of support    Ambulation Surface  Outdoor;Paved;Grass    Gait velocity  32.8/13.75=2.39 ft/sec    Curb  5: Supervision    Curb Details (indicate cue type and reason)  first foot stepping down he caught his heel (did not lose balance); with SPC    Gait Comments  3 standing rest breaks; increased fatigue      Knee/Hip Exercises: Standing   Abduction Limitations  minisquat with walking sideways x 32 ft      Knee/Hip Exercises: Seated   Long Arc Quad  Strengthening;Both;1 set;15 reps;Weights 5#    Hamstring Curl  Strengthening;Both;1 set;10 reps green band          Balance Exercises - 10/30/17 1221      Balance Exercises: Standing   Step Ups  4 inch;UE support 2;Forward backward; x 10 each with LLE    Retro Gait  Upper extremity support x 8 ft x 4     SLS x 10 sec each leg x 3 bil UE support on counter Feet together standing on foam (blue airex) EC up to 10 seconds; EO marching x 20;     PT Short Term Goals - 10/30/17 1425      PT SHORT TERM GOAL #1   Title  Patient will complete HEP with supervision of family to assure proper technique. (Target for all STGs 11/01/2017--date reflects 4 weeks after 1st treatment 10/03/17)    Time  4     Period  Weeks    Status  Achieved      PT SHORT TERM GOAL #2   Title  Patient will improve TUG normal to <13.5 seconds with SPC to demonstrate lesser fall risk.     Time  4    Period  Weeks    Status  New      PT SHORT  TERM GOAL #3   Title  Patient will improve Berg score >=44/56 demonstrating adequate balance for use of cane indoors.     Time  4    Period  Weeks    Status  New      PT SHORT TERM GOAL #4   Title  Patient can verbalize signs/symptoms of CVA, appropriate actions to take, and understanding of need to get to hospital within 3 hours of onset of symptoms    Time  4    Period  Weeks        PT Long Term Goals - 09/18/17 1946      PT LONG TERM GOAL #1   Title  Patient will be independent with HEP (using handouts) for balance and strengthening. (Target for all LTG's 12/02/2017--based on late start date)    Time  8    Period  Weeks    Status  New    Target Date  12/02/17      PT LONG TERM GOAL #2   Title  Patient will improve gait velocity to >=2.62 ft/sec with least restrictive assistive device (indicative of velocity for safe community ambulation)    Time  8    Period  Weeks    Status  New      PT LONG TERM GOAL #3   Title  Patient will improve Berg to >=47/56 indicative of safe use of cane outdoors.     Time  8    Period  Weeks    Status  New      PT LONG TERM GOAL #4   Title  Patient able to ambulate >= 200 ft outdoors with cane modified independent over unlevel ground to allow safe walking to his bird feeders in his back yard.     Time  8    Period  Weeks    Status  New            Plan - 10/30/17 1424    Clinical Impression Statement  Initiated session with walking outside over unlevel terrain with SPC (up and down-sloping sidewalk, grass, curb). Over 500 ft he required 3 standing rest breaks and one seated rest break with noted incr fatigue/dyspnea. Throughout remainder of session he continued with fatigue and required rest breaks. Assessed BP and  was lower than normal (88/48) and educated to drink fluids due to ?dehydration. (Pt/wife report he has had issues with dehydration in the past). They plan to go straight home, each lunch/drink fluids and reassess BP.     Rehab Potential  Good    Clinical Impairments Affecting Rehab Potential  - Multiple co-morbidities; + very motivated    PT Frequency  2x / week    PT Duration  8 weeks    PT Treatment/Interventions  ADLs/Self Care Home Management;Aquatic Therapy;Electrical Stimulation;Gait training;DME Instruction;Stair training;Functional mobility training;Therapeutic activities;Therapeutic exercise;Balance training;Neuromuscular re-education;Cognitive remediation;Manual techniques;Orthotic Fit/Training;Patient/family education;Passive range of motion    PT Next Visit Plan  check STGs; ?attempt walking without cane? (his goal); continue to address strengthening (esp LLE) and balance (lacks sensation and limited lt ankle ROM due to prior injury); his goal is to return to not using his cane     PT Home Exercise Plan  reports no HEP given on CIR, wife confirms    Consulted and Agree with Plan of Care  Patient;Family member/caregiver    Family Member Consulted  wife       Patient will benefit from skilled therapeutic intervention in order to improve the  following deficits and impairments:  Abnormal gait, Decreased activity tolerance, Decreased balance, Decreased cognition, Decreased mobility, Decreased knowledge of use of DME, Decreased strength, Increased edema, Impaired sensation, Postural dysfunction, Impaired UE functional use, Obesity  Visit Diagnosis: Muscle weakness (generalized)  Other symptoms and signs involving the nervous system  Other abnormalities of gait and mobility     Problem List Patient Active Problem List   Diagnosis Date Noted  . CKD (chronic kidney disease) stage 4, GFR 15-29 ml/min (HCC) 09/26/2017  . 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 Diastolic dysfunction   . Coronary artery disease involving native coronary artery of native heart without angina pectoris   . Cardiac pacemaker in situ   . Atrial fibrillation with rapid ventricular response ([place])   . PVD (peripheral vascular disease) ([place])   . Lower extremity edema 04/21/2017  . Anxiety 03/22/2017  . Acute cholecystitis s/p lap cholecystectomy 03/05/2017 03/04/2017  . HOCM (hypertrophic obstructive cardiomyopathy) ([place]) 03/04/2017  . 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 Fatigue 01/27/2017  . Low vitamin B12 level 01/27/2017  . Syncope 07/24/2015  . 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 OSA on CPAP 10/26/2014  . Back pain 10/16/2014  . Seasonal and perennial allergic rhinitis 10/25/2013  . Hypertension associated with diabetes ([place]) 10/25/2013  . Hyperlipidemia associated with type 2 diabetes mellitus ([place]) 10/25/2013  . Morbid obesity ([place]) 10/25/2013    Rexanne Mano, PT 10/30/2017, 2:37 PM  [facility] [address] [place], [name], [address] Phone: [phone]   Fax:  [phone]  Name: David [name] MRN: [id number] Date of Birth: [birth year]

## 2017-10-30 NOTE — Therapy (Signed)
[facility] [address] [place], [name], [address] Phone: [phone]   Fax:  [phone]  Occupational Therapy Treatment  Patient Details  Name: David [name] MRN: [id number] Date of Birth: 1940-10-05 Referring Provider: Dr. Naaman Plummer   Encounter Date: 10/30/2017  OT End of Session - 10/30/17 1116    Visit Number  8    Number of Visits  17    Date for OT Re-Evaluation  11/18/17    Authorization Type  Medicare/ [employer]    OT Start Time  1107    OT Stop Time  1145    OT Time Calculation (min)  38 min       Past Medical History:  Diagnosis Date  . Anemia, iron deficiency   . Anxiety   . Arthritis   . BPH (benign prostatic hypertrophy)   . CAD (coronary artery disease)    Nonobstructive CAD per cath  . Cardiac pacemaker in situ   . CHF (congestive heart failure) ([place])   . Chronic ulcer of right foot ([place])   . CKD (chronic kidney disease), stage III ([place]) secondary to DM and HTN   nephrologist-  [name]  . Dyspnea   . History of cellulitis    right great toe 10-25-2014  . History of skin cancer   . HOCM (hypertrophic obstructive cardiomyopathy) ([place])   . Hypertension   . Insulin dependent type 2 diabetes mellitus ([place]) 1991   followd by [name]--  has insulin pump  . Insulin pump in place   . OSA on CPAP   . Peripheral neuropathy    severe  . Peripheral vascular disease ([place])    bilateral lower extremities  . Rib fracture 07/24/2015  . Secondary hyperparathyroidism of renal origin ([place])   . Sinus node dysfunction (HCC)   . Sleep apnea     Past Surgical History:  Procedure Laterality Date  . AMPUTATION OF REPLICATED TOES  Mar 9794   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)  . AMPUTATION TOE Right 03/12/2015   Procedure: RIGHT HALLUS AMPUTATION ;  Surgeon: [name], MD;  Location: [facility];  Service: Podiatry;  Laterality: Right;  . CARDIAC CATHETERIZATION  11-25-2010    [place], [place]   Nonobstructive CAD  . CARDIAC PACEMAKER PLACEMENT  Nov 2009   [employer]  . CHOLECYSTECTOMY N/A 03/05/2017   Procedure: LAPAROSCOPIC CHOLECYSTECTOMY WITH INTRAOPERATIVE CHOLANGIOGRAM;  Surgeon: [name], MD;  [facility];  Service: General;  Laterality: N/A;  . EP IMPLANTABLE DEVICE N/A 06/03/2015   Procedure: PPM Generator Changeout;  Surgeon: [name], MD;  Location: [facility];  Service: Cardiovascular;  Laterality: N/A;  . EXCISION BONE CYST Right 03/06/2015   Procedure: BONE BIOPSIES OF RIGHT FOOT;  Surgeon: [name], MD;  Location: [facility];  Service: Podiatry;  Laterality: Right;  . ORIF ANKLE FRACTURE Left 11/06/2014   Procedure: OPEN REDUCTION INTERNAL FIXATION (ORIF) LEFT  ANKLE FRACTURE;  Surgeon: [name], MD;  Location: [facility];  Service: Orthopedics;  Laterality: Left;  . TEE WITHOUT CARDIOVERSION N/A 09/01/2017   Procedure: TRANSESOPHAGEAL ECHOCARDIOGRAM (TEE);  Surgeon: [name], MD;  Location: [facility];  Service: Cardiovascular;  Laterality: N/A;  . TOTAL KNEE ARTHROPLASTY    . VEIN LIGATION AND STRIPPING      There were no vitals filed for this visit.  Subjective Assessment - 10/30/17 1115    Patient Stated Goals  to get back to prior  functional level    Currently in Pain?  No/denies                Treatment: Reviewed green theraband HEP with pt, wife and granddtr as pt is [place] to be at the beach next [place], min v.c for proper position, family verbalizes understanding of how to assist. Dynamic standing balance activity with a cogniitive component to retrieve small pegs from a lower surface and place in vertical pegboard while standing. Pt demonstrated minimal drops and good performance copying design. Ambulating with cane while tossing ball and locating playing cards for divided attention, min difficulty/ v.c 17/19 located on first pass. No rest breaks required. Visual  memory task on I-pad while therapist set up environmental scanning, min difficulty.             OT Short Term Goals - 10/25/17 1222      OT SHORT TERM GOAL #1   Title  I with HEP for UE strength    Status  Achieved      OT SHORT TERM GOAL #2   Title  Pt will verbalize understanding of compensatory strategies for short term memory deficits.    Status  Achieved      OT SHORT TERM GOAL #3   Title  Pt will perform basic home management/ cooking with supervision and no LOB demonstrating good safety awareness.    Time  4    Period  Weeks    Status  Achieved      OT SHORT TERM GOAL #4   Title  Pt demonstrate selective attention to a functional task  in a busy environment x 20 without redirection.    Status  Achieved      OT SHORT TERM GOAL #5   Title  Further assess cogntion and set additional goals prn    Status  Deferred        OT Long Term Goals - 10/30/17 1123      OT LONG TERM GOAL #1   Title  Pt will perform home managment and cooking activities modified independently demonstrating good safety awareness and no LOB.    Status  On-going      OT LONG TERM GOAL #2   Title  Pt will demonstrate ability to perfrom a physical and cogntive task simultaneously with 90% or better accuracy.    Status  On-going      OT LONG TERM GOAL #3   Title  Pt will perform basic money exchange for functional setting with 90% or greater accuracy    Status  Deferred            Plan - 10/30/17 1229    Clinical Impression Statement  Pt is progressing towards goals. He demonstrates improving endurance and divided attention today.    Rehab Potential  Good    Current Impairments/barriers affecting progress:  cogntive deficits, toe amputations, neuropathy affecting mobility    OT Frequency  2x / week    OT Duration  8 weeks    Plan  continue to address divided / altenating attention in a functional context, as well as balance/ endurance for home managment.    Consulted and Agree  with Plan of Care  Patient;Family member/caregiver       Patient will benefit from skilled therapeutic intervention in order to improve the following deficits and impairments:  Abnormal gait, Decreased cognition, Decreased knowledge of use of DME, Impaired flexibility, Impaired vision/preception, Decreased mobility, Decreased activity tolerance, Decreased endurance, Impaired UE functional  use, Difficulty walking, Decreased safety awareness, Decreased knowledge of precautions, Decreased balance  Visit Diagnosis: Other symptoms and signs involving the nervous system  Cognitive social or emotional deficit following cerebral infarction  Muscle weakness (generalized)  Abnormal posture    Problem List Patient Active Problem List   Diagnosis Date Noted  . CKD (chronic kidney disease) stage 4, GFR 15-29 ml/min (HCC) 09/26/2017  . 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 Diastolic dysfunction   . Coronary artery disease involving native coronary artery of native heart without angina pectoris   . Cardiac pacemaker in situ   . Atrial fibrillation with rapid ventricular response ([place])   . PVD (peripheral vascular disease) ([place])   . Lower extremity edema 04/21/2017  . Anxiety 03/22/2017  . Acute cholecystitis s/p lap cholecystectomy 03/05/2017 03/04/2017  . HOCM (hypertrophic obstructive cardiomyopathy) ([place]) 03/04/2017  . 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 Fatigue 01/27/2017  . Low vitamin B12 level 01/27/2017  . Syncope 07/24/2015  . 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 OSA on CPAP 10/26/2014  . Back pain 10/16/2014  . Seasonal and perennial allergic rhinitis 10/25/2013  . Hypertension associated with diabetes ([place]) 10/25/2013  . Hyperlipidemia associated with type 2 diabetes mellitus ([place]) 10/25/2013  . Morbid obesity ([place]) 10/25/2013    RINE,KATHRYN 10/30/2017, 12:31  PM  [facility] [address] [place], [name], [address] Phone: [phone]   Fax:  [phone]  Name: RAMAR NOBREGA MRN: [id number] Date of Birth: 1941-05-05

## 2017-10-31 ENCOUNTER — Encounter: Payer: Self-pay | Admitting: Sports Medicine

## 2017-10-31 ENCOUNTER — Ambulatory Visit (INDEPENDENT_AMBULATORY_CARE_PROVIDER_SITE_OTHER): Payer: Medicare Other | Admitting: Adult Health

## 2017-10-31 ENCOUNTER — Encounter: Payer: Self-pay | Admitting: Adult Health

## 2017-10-31 ENCOUNTER — Ambulatory Visit (INDEPENDENT_AMBULATORY_CARE_PROVIDER_SITE_OTHER): Payer: Medicare Other | Admitting: Sports Medicine

## 2017-10-31 VITALS — BP 102/53 | HR 62 | Wt 269.0 lb

## 2017-10-31 DIAGNOSIS — M79675 Pain in left toe(s): Secondary | ICD-10-CM

## 2017-10-31 DIAGNOSIS — I639 Cerebral infarction, unspecified: Secondary | ICD-10-CM

## 2017-10-31 DIAGNOSIS — B351 Tinea unguium: Secondary | ICD-10-CM | POA: Diagnosis not present

## 2017-10-31 DIAGNOSIS — E1159 Type 2 diabetes mellitus with other circulatory complications: Secondary | ICD-10-CM

## 2017-10-31 DIAGNOSIS — E0842 Diabetes mellitus due to underlying condition with diabetic polyneuropathy: Secondary | ICD-10-CM

## 2017-10-31 DIAGNOSIS — I10 Essential (primary) hypertension: Secondary | ICD-10-CM

## 2017-10-31 DIAGNOSIS — M79674 Pain in right toe(s): Secondary | ICD-10-CM | POA: Diagnosis not present

## 2017-10-31 DIAGNOSIS — E785 Hyperlipidemia, unspecified: Secondary | ICD-10-CM

## 2017-10-31 DIAGNOSIS — Z794 Long term (current) use of insulin: Secondary | ICD-10-CM | POA: Diagnosis not present

## 2017-10-31 NOTE — Progress Notes (Signed)
Subjective: David [name] is a 77 y.o. male patient seen in office for Diabetic nail trim. Patient has a history of diabetes and a blood glucose "good" 142 this morning, A1c 8.3.  Reports that he was in hospital and coded but now is better and is in PT. Patient is on [place]. Denies nausea/vomitting/fever/chills/night sweats or any constitutional symptoms. Patient has no other pedal complaints at this time.  Patient Active Problem List   Diagnosis Date Noted  . CKD (chronic kidney disease) stage 4, GFR 15-29 ml/min (HCC) 09/26/2017  . 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 Diastolic dysfunction   . Coronary artery disease involving native coronary artery of native heart without angina pectoris   . Cardiac pacemaker in situ   . Atrial fibrillation with rapid ventricular response ([place])   . PVD (peripheral vascular disease) ([place])   . Lower extremity edema 04/21/2017  . Anxiety 03/22/2017  . Acute cholecystitis s/p lap cholecystectomy 03/05/2017 03/04/2017  . HOCM (hypertrophic obstructive cardiomyopathy) ([place]) 03/04/2017  . 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 Fatigue 01/27/2017  . Low vitamin B12 level 01/27/2017  . Syncope 07/24/2015  . 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 OSA on CPAP 10/26/2014  . Back pain 10/16/2014  . Seasonal and perennial allergic rhinitis 10/25/2013  . Hypertension associated with diabetes ([place]) 10/25/2013  . Hyperlipidemia associated with type 2 diabetes mellitus ([place]) 10/25/2013  . Morbid obesity ([place]) 10/25/2013   Current Outpatient Medications on File Prior to Visit  Medication Sig Dispense Refill  . acetaminophen (TYLENOL) 325 MG tablet Take 1-2 tablets (325-650 mg total) by mouth every 4 (four) hours as needed for mild pain.    Marland Kitchen atorvastatin (LIPITOR) 10 MG tablet Take 1 tablet (10 mg total) by mouth daily at 6 PM. 90 tablet 0  . calcitRIOL  (ROCALTROL) 0.25 MCG capsule Take 1 capsule (0.25 mcg total) by mouth daily. 90 capsule 1  . carvedilol (COREG) 25 MG tablet Take 1 tablet (25 mg total) by mouth 2 (two) times daily with a meal. 60 tablet 0  . cholecalciferol (VITAMIN D) 1000 units tablet Take 1 tablet (1,000 Units total) by mouth daily. 30 tablet 1  . clopidogrel (PLAVIX) 75 MG tablet Take 1 tablet (75 mg total) by mouth daily. 30 tablet 0  . diclofenac sodium (VOLTAREN) 1 % GEL Apply 2 g topically 3 (three) times daily. 4 Tube 0  . hydrOXYzine (ATARAX/VISTARIL) 50 MG tablet Take 1 tablet (50 mg total) 3 (three) times daily as needed by mouth. 30 tablet 1  . insulin regular human CONCENTRATED (HUMULIN R) 500 UNIT/ML injection Inject into the skin.    Marland Kitchen isosorbide mononitrate (IMDUR) 30 MG 24 hr tablet Take 1 tablet (30 mg total) by mouth daily. 30 tablet 0  . liraglutide (VICTOZA) 18 MG/3ML SOPN Inject 0.2 mLs (1.2 mg total) into the skin daily. 2 pen 3  . lisinopril (PRINIVIL,ZESTRIL) 5 MG tablet Take 1 tablet by mouth daily.  6  . pantoprazole (PROTONIX) 40 MG tablet TAKE ONE TABLET BY MOUTH ONCE DAILY 90 tablet 1  . sertraline (ZOLOFT) 100 MG tablet Take 100 mg by mouth daily.    Marland Kitchen torsemide (DEMADEX) 10 MG tablet Take 1 tablet (10 mg total) by mouth 2 (two) times daily. 180 tablet 1  . vitamin B-12 (CYANOCOBALAMIN) 100 MCG tablet Take 100 mcg by mouth daily.     No  current facility-administered medications on file prior to visit.    Allergies  Allergen Reactions  . Adhesive [Tape] Other (See Comments)    blisters    Recent Results (from the past 2160 hour(s))  CUP PACEART REMOTE DEVICE CHECK     Status: None   Collection Time: 08/14/17  5:17 PM  Result Value Ref Range   Date Time Interrogation Session [id number]    Pulse Generator Manufacturer MERM    Pulse Gen Model [id number] Advisa DR MRI    Pulse Gen Serial Number [id number]    Clinic Name [facility]    Implantable Pulse Generator Type Implantable  Pulse Generator    Implantable Pulse Generator Implant Date [id number]    Implantable Lead Manufacturer MERM    Implantable Lead Model 5076 CapSureFix Novus    Implantable Lead Serial Number [id number]    Implantable Lead Implant Date 81829937    Implantable Lead Location Detail 1 UNKNOWN    Implantable Lead Location [id number]    Implantable Lead Manufacturer [facility]    Implantable Lead Model 5076 CapSureFix Novus    Implantable Lead Serial Number [id number]    Implantable Lead Implant Date [id number]    Implantable Lead Location Detail 1 UNKNOWN    Implantable Lead Location [phone]    Lead Channel Setting Sensing Sensitivity 2.8 mV   Lead Channel Setting Pacing Amplitude 2.5 V   Lead Channel Setting Pacing Pulse Width 0.4 ms   Lead Channel Setting Pacing Amplitude 3.5 V   Lead Channel Impedance Value 570 ohm   Lead Channel Impedance Value 418 ohm   Lead Channel Sensing Intrinsic Amplitude 1.375 mV   Lead Channel Sensing Intrinsic Amplitude 1.375 mV   Lead Channel Pacing Threshold Amplitude 2.25 V   Lead Channel Pacing Threshold Pulse Width 0.4 ms   Lead Channel Impedance Value 988 ohm   Lead Channel Impedance Value 418 ohm   Lead Channel Sensing Intrinsic Amplitude 21.5 mV   Lead Channel Sensing Intrinsic Amplitude 21.5 mV   Lead Channel Pacing Threshold Amplitude 1.75 V   Lead Channel Pacing Threshold Pulse Width 0.4 ms   Battery Status OK    Battery Remaining Longevity 76 mo   Battery Voltage 3.00 V   Brady Statistic RA Percent Paced 99.81 %   Brady Statistic RV Percent Paced 0.05 %   Brady Statistic AP VP Percent 0.04 %   Brady Statistic AS VP Percent 0 %   Brady Statistic AP VS Percent 99.84 %   Brady Statistic AS VS Percent 0.11 %   Eval Rhythm ApVs   CBG monitoring, ED     Status: Abnormal   Collection Time: 08/30/17  8:48 PM  Result Value Ref Range   Glucose-Capillary 116 (H) 65 - 99 mg/dL   Comment 1 Notify RN   CBC with Differential     Status: Abnormal   Collection Time:  08/30/17  8:57 PM  Result Value Ref Range   WBC 8.6 4.0 - 10.5 K/uL   RBC 3.82 (L) 4.22 - 5.81 MIL/uL   Hemoglobin 11.5 (L) 13.0 - 17.0 g/dL   HCT 34.8 (L) 39.0 - 52.0 %   MCV 91.1 78.0 - 100.0 fL   MCH 30.1 26.0 - 34.0 pg   MCHC 33.0 30.0 - 36.0 g/dL   RDW 14.7 11.5 - 15.5 %   Platelets 190 150 - 400 K/uL   Neutrophils Relative % 70 %   Neutro Abs 6.0 1.7 - 7.7 K/uL   Lymphocytes Relative 18 %  Lymphs Abs 1.6 0.7 - 4.0 K/uL   Monocytes Relative 10 %   Monocytes Absolute 0.8 0.1 - 1.0 K/uL   Eosinophils Relative 2 %   Eosinophils Absolute 0.2 0.0 - 0.7 K/uL   Basophils Relative 0 %   Basophils Absolute 0.0 0.0 - 0.1 K/uL    Comment: Performed at [facility] [address], [place], [place] [address]  Basic metabolic panel     Status: Abnormal   Collection Time: 08/30/17  8:57 PM  Result Value Ref Range   Sodium 139 135 - 145 mmol/L   Potassium 4.7 3.5 - 5.1 mmol/L   Chloride 108 101 - 111 mmol/L   CO2 23 22 - 32 mmol/L   Glucose, Bld 136 (H) 65 - 99 mg/dL   BUN 33 (H) 6 - 20 mg/dL   Creatinine, Ser 1.95 (H) 0.61 - 1.24 mg/dL   Calcium 9.5 8.9 - 10.3 mg/dL   GFR calc non Af Amer 31 (L) >60 mL/min   GFR calc Af Amer 36 (L) >60 mL/min    Comment: (NOTE) The eGFR has been calculated using the CKD EPI equation. This calculation has not been validated in all clinical situations. eGFR's persistently <60 mL/min signify possible Chronic Kidney Disease.    Anion gap 8 5 - 15    Comment: Performed at [facility] [address] [place], [place] [address]  Urinalysis, Routine w reflex microscopic     Status: Abnormal   Collection Time: 08/30/17 10:06 PM  Result Value Ref Range   Color, Urine STRAW (A) YELLOW   APPearance CLEAR CLEAR   Specific Gravity, Urine 1.010 1.005 - 1.030   pH 5.0 5.0 - 8.0   Glucose, UA NEGATIVE NEGATIVE mg/dL   Hgb urine dipstick SMALL (A) NEGATIVE   Bilirubin Urine NEGATIVE NEGATIVE   Ketones, ur NEGATIVE NEGATIVE mg/dL   Protein,  ur 100 (A) NEGATIVE mg/dL   Nitrite NEGATIVE NEGATIVE   Leukocytes, UA NEGATIVE NEGATIVE   RBC / HPF 0-5 0 - 5 RBC/hpf   WBC, UA 0-5 0 - 5 WBC/hpf   Bacteria, UA NONE SEEN NONE SEEN   Squamous Epithelial / LPF NONE SEEN NONE SEEN   Mucus PRESENT     Comment: Performed at [facility], [place] [address] [place], [place] [address]  CBG monitoring, ED     Status: None   Collection Time: 08/31/17  1:07 AM  Result Value Ref Range   Glucose-Capillary 99 65 - 99 mg/dL  CBG monitoring, ED     Status: Abnormal   Collection Time: 08/31/17  2:49 AM  Result Value Ref Range   Glucose-Capillary 111 (H) 65 - 99 mg/dL  Hemoglobin A1c     Status: Abnormal   Collection Time: 08/31/17  5:00 AM  Result Value Ref Range   Hgb A1c MFr Bld 7.6 (H) 4.8 - 5.6 %    Comment: (NOTE) Pre diabetes:          5.7%-6.4% Diabetes:              >6.4% Glycemic control for   <7.0% adults with diabetes    Mean Plasma Glucose 171.42 mg/dL    Comment: Performed at [facility], [place] [address] [place], [place] [address]  Lipid panel     Status: Abnormal   Collection Time: 08/31/17  5:00 AM  Result Value Ref Range   Cholesterol 165 0 - 200 mg/dL   Triglycerides 195 (H) <150  mg/dL   HDL 20 (L) >40 mg/dL   Total CHOL/HDL Ratio 8.3 RATIO   VLDL 39 0 - 40 mg/dL   LDL Cholesterol 106 (H) 0 - 99 mg/dL    Comment:        Total Cholesterol/HDL:CHD Risk Coronary Heart Disease Risk Table                     Men   Women  1/2 Average Risk   3.4   3.3  Average Risk       5.0   4.4  2 X Average Risk   9.6   7.1  3 X Average Risk  23.4   11.0        Use the calculated Patient Ratio above and the CHD Risk Table to determine the patient's CHD Risk.        ATP III CLASSIFICATION (LDL):  <100     mg/dL   Optimal  100-129  mg/dL   Near or Above                    Optimal  130-159  mg/dL   Borderline  160-189  mg/dL   High  >190     mg/dL   Very High Performed at [facility] [address],  [place], [place] [address]   CBG monitoring, ED     Status: Abnormal   Collection Time: 08/31/17  5:53 AM  Result Value Ref Range   Glucose-Capillary 156 (H) 65 - 99 mg/dL   Comment 1 Notify RN   CBG monitoring, ED     Status: Abnormal   Collection Time: 08/31/17  7:42 AM  Result Value Ref Range   Glucose-Capillary 185 (H) 65 - 99 mg/dL   Comment 1 Notify RN    Comment 2 Document in Chart   CBG monitoring, ED     Status: Abnormal   Collection Time: 08/31/17  1:02 PM  Result Value Ref Range   Glucose-Capillary 202 (H) 65 - 99 mg/dL   Comment 1 Notify RN    Comment 2 Document in Chart   ECHOCARDIOGRAM COMPLETE     Status: None   Collection Time: 08/31/17  2:38 PM  Result Value Ref Range   Weight 4,592 oz   Height 76 in   BP 193/78 mmHg  CBG monitoring, ED     Status: Abnormal   Collection Time: 08/31/17  5:57 PM  Result Value Ref Range   Glucose-Capillary 181 (H) 65 - 99 mg/dL   Comment 1 Notify RN    Comment 2 Document in Chart   Glucose, capillary     Status: Abnormal   Collection Time: 08/31/17  9:01 PM  Result Value Ref Range   Glucose-Capillary 183 (H) 65 - 99 mg/dL  Glucose, capillary     Status: Abnormal   Collection Time: 09/01/17  7:53 AM  Result Value Ref Range   Glucose-Capillary 222 (H) 65 - 99 mg/dL  Glucose, capillary     Status: Abnormal   Collection Time: 09/01/17 12:56 PM  Result Value Ref Range   Glucose-Capillary 215 (H) 65 - 99 mg/dL  Glucose, capillary     Status: Abnormal   Collection Time: 09/01/17  5:50 PM  Result Value Ref Range   Glucose-Capillary 194 (H) 65 - 99 mg/dL  Glucose, capillary     Status: Abnormal   Collection Time: 09/01/17  9:11 PM  Result Value Ref Range   Glucose-Capillary 168 (  H) 65 - 99 mg/dL  MRSA PCR Screening     Status: None   Collection Time: 09/02/17  6:35 AM  Result Value Ref Range   MRSA by PCR NEGATIVE NEGATIVE    Comment:        The GeneXpert MRSA Assay (FDA approved for NASAL specimens only), is one component of  a comprehensive MRSA colonization surveillance program. It is not intended to diagnose MRSA infection nor to guide or monitor treatment for MRSA infections. Performed at [facility], [place] [address] [place], [name] [address]   Glucose, capillary     Status: Abnormal   Collection Time: 09/02/17  8:31 AM  Result Value Ref Range   Glucose-Capillary 182 (H) 65 - 99 mg/dL  Glucose, capillary     Status: Abnormal   Collection Time: 09/02/17 11:24 AM  Result Value Ref Range   Glucose-Capillary 242 (H) 65 - 99 mg/dL  CBC     Status: Abnormal   Collection Time: 09/02/17 12:09 PM  Result Value Ref Range   WBC 7.1 4.0 - 10.5 K/uL   RBC 3.65 (L) 4.22 - 5.81 MIL/uL   Hemoglobin 11.2 (L) 13.0 - 17.0 g/dL   HCT 33.6 (L) 39.0 - 52.0 %   MCV 92.1 78.0 - 100.0 fL   MCH 30.7 26.0 - 34.0 pg   MCHC 33.3 30.0 - 36.0 g/dL   RDW 15.0 11.5 - 15.5 %   Platelets 159 150 - 400 K/uL    Comment: Performed at [facility], [place]. [address], [place], [place] [address]  Basic metabolic panel     Status: Abnormal   Collection Time: 09/02/17 12:09 PM  Result Value Ref Range   Sodium 136 135 - 145 mmol/L   Potassium 4.7 3.5 - 5.1 mmol/L   Chloride 104 101 - 111 mmol/L   CO2 20 (L) 22 - 32 mmol/L   Glucose, Bld 248 (H) 65 - 99 mg/dL   BUN 51 (H) 6 - 20 mg/dL   Creatinine, Ser 2.41 (H) 0.61 - 1.24 mg/dL   Calcium 8.5 (L) 8.9 - 10.3 mg/dL   GFR calc non Af Amer 24 (L) >60 mL/min   GFR calc Af Amer 28 (L) >60 mL/min    Comment: (NOTE) The eGFR has been calculated using the CKD EPI equation. This calculation has not been validated in all clinical situations. eGFR's persistently <60 mL/min signify possible Chronic Kidney Disease.    Anion gap 12 5 - 15    Comment: Performed at [facility] [address] [place], [name] [address]  Troponin I (q 6hr x 3)     Status: Abnormal   Collection Time: 09/02/17 12:09 PM  Result Value Ref Range   Troponin I 0.04 (HH) <0.03 ng/mL    Comment:  CRITICAL RESULT CALLED TO, READ BACK BY AND VERIFIED WITH: RN S FIELDS AT [id number] MARTINB Performed at [facility], [place] [address] [place],  [address]   Blood gas, arterial     Status: Abnormal   Collection Time: 09/02/17 12:50 PM  Result Value Ref Range   FIO2 21.00    pH, Arterial 7.377 7.350 - 7.450   pCO2 arterial 42.9 32.0 - 48.0 mmHg   pO2, Arterial 65.5 (L) 83.0 - 108.0 mmHg   Bicarbonate 24.8 20.0 - 28.0 mmol/L   Acid-Base Excess 0.1 0.0 - 2.0 mmol/L   O2 Saturation 92.2 %   Patient temperature 97.6    Collection  site LEFT BRACHIAL    Drawn by [phone]    Sample type ARTERIAL DRAW    Allens test (pass/fail) PASS PASS  Magnesium     Status: None   Collection Time: 09/02/17  2:23 PM  Result Value Ref Range   Magnesium 2.1 1.7 - 2.4 mg/dL    Comment: Performed at [facility], [place] [address] [place], [name] [address]  Glucose, capillary     Status: Abnormal   Collection Time: 09/02/17  5:26 PM  Result Value Ref Range   Glucose-Capillary 202 (H) 65 - 99 mg/dL  Troponin I (q 6hr x 3)     Status: Abnormal   Collection Time: 09/02/17  5:34 PM  Result Value Ref Range   Troponin I 0.07 (HH) <0.03 ng/mL    Comment: CRITICAL VALUE NOTED.  VALUE IS CONSISTENT WITH PREVIOUSLY REPORTED AND CALLED VALUE. Performed at [facility], [place] [address] [place], [name] [address]   Glucose, capillary     Status: Abnormal   Collection Time: 09/02/17  9:56 PM  Result Value Ref Range   Glucose-Capillary 152 (H) 65 - 99 mg/dL  Troponin I (q 6hr x 3)     Status: Abnormal   Collection Time: 09/03/17 12:30 AM  Result Value Ref Range   Troponin I 0.04 (HH) <0.03 ng/mL    Comment: CRITICAL VALUE NOTED.  VALUE IS CONSISTENT WITH PREVIOUSLY REPORTED AND CALLED VALUE. Performed at [facility], [place] [address], [place], [place] [address]   CBC     Status: Abnormal   Collection Time: 09/03/17 12:30 AM  Result Value Ref Range   WBC 7.2 4.0 - 10.5 K/uL   RBC  3.72 (L) 4.22 - 5.81 MIL/uL   Hemoglobin 11.0 (L) 13.0 - 17.0 g/dL   HCT 33.9 (L) 39.0 - 52.0 %   MCV 91.1 78.0 - 100.0 fL   MCH 29.6 26.0 - 34.0 pg   MCHC 32.4 30.0 - 36.0 g/dL   RDW 14.8 11.5 - 15.5 %   Platelets 170 150 - 400 K/uL    Comment: Performed at [facility], [place] [address] [place], [place] [address]  Basic metabolic panel     Status: Abnormal   Collection Time: 09/03/17 12:30 AM  Result Value Ref Range   Sodium 138 135 - 145 mmol/L   Potassium 4.2 3.5 - 5.1 mmol/L   Chloride 106 101 - 111 mmol/L   CO2 22 22 - 32 mmol/L   Glucose, Bld 147 (H) 65 - 99 mg/dL   BUN 52 (H) 6 - 20 mg/dL   Creatinine, Ser 2.39 (H) 0.61 - 1.24 mg/dL   Calcium 8.4 (L) 8.9 - 10.3 mg/dL   GFR calc non Af Amer 25 (L) >60 mL/min   GFR calc Af Amer 28 (L) >60 mL/min    Comment: (NOTE) The eGFR has been calculated using the CKD EPI equation. This calculation has not been validated in all clinical situations. eGFR's persistently <60 mL/min signify possible Chronic Kidney Disease.    Anion gap 10 5 - 15    Comment: Performed at [facility] [address] [place], [place] [address]  Magnesium     Status: None   Collection Time: 09/03/17 12:30 AM  Result Value Ref Range   Magnesium 2.0 1.7 - 2.4 mg/dL    Comment: Performed at [facility] [address] [place], [name] [address]  Glucose, capillary     Status:  Abnormal   Collection Time: 09/03/17  8:34 AM  Result Value Ref Range   Glucose-Capillary 207 (H) 65 - 99 mg/dL  Glucose, capillary     Status: Abnormal   Collection Time: 09/03/17 12:10 PM  Result Value Ref Range   Glucose-Capillary 220 (H) 65 - 99 mg/dL  Glucose, capillary     Status: Abnormal   Collection Time: 09/03/17  5:16 PM  Result Value Ref Range   Glucose-Capillary 146 (H) 65 - 99 mg/dL  Glucose, capillary     Status: Abnormal   Collection Time: 09/03/17  9:47 PM  Result Value Ref Range   Glucose-Capillary 138 (H) 65 - 99 mg/dL  Glucose, capillary      Status: Abnormal   Collection Time: 09/04/17  8:16 AM  Result Value Ref Range   Glucose-Capillary 134 (H) 65 - 99 mg/dL  Glucose, capillary     Status: Abnormal   Collection Time: 09/04/17 11:44 AM  Result Value Ref Range   Glucose-Capillary 199 (H) 65 - 99 mg/dL  Glucose, capillary     Status: Abnormal   Collection Time: 09/04/17  1:03 PM  Result Value Ref Range   Glucose-Capillary 198 (H) 65 - 99 mg/dL  Glucose, capillary     Status: Abnormal   Collection Time: 09/04/17  5:41 PM  Result Value Ref Range   Glucose-Capillary 216 (H) 65 - 99 mg/dL  Glucose, capillary     Status: Abnormal   Collection Time: 09/04/17  9:01 PM  Result Value Ref Range   Glucose-Capillary 274 (H) 65 - 99 mg/dL  CBC WITH DIFFERENTIAL     Status: Abnormal   Collection Time: 09/05/17  5:20 AM  Result Value Ref Range   WBC 6.2 4.0 - 10.5 K/uL   RBC 3.44 (L) 4.22 - 5.81 MIL/uL   Hemoglobin 10.4 (L) 13.0 - 17.0 g/dL   HCT 31.8 (L) 39.0 - 52.0 %   MCV 92.4 78.0 - 100.0 fL   MCH 30.2 26.0 - 34.0 pg   MCHC 32.7 30.0 - 36.0 g/dL   RDW 14.8 11.5 - 15.5 %   Platelets 156 150 - 400 K/uL   Neutrophils Relative % 61 %   Neutro Abs 3.7 1.7 - 7.7 K/uL   Lymphocytes Relative 21 %   Lymphs Abs 1.3 0.7 - 4.0 K/uL   Monocytes Relative 15 %   Monocytes Absolute 0.9 0.1 - 1.0 K/uL   Eosinophils Relative 3 %   Eosinophils Absolute 0.2 0.0 - 0.7 K/uL   Basophils Relative 0 %   Basophils Absolute 0.0 0.0 - 0.1 K/uL    Comment: Performed at [facility], [address], [place], [place] [address]  Comprehensive metabolic panel     Status: Abnormal   Collection Time: 09/05/17  5:20 AM  Result Value Ref Range   Sodium 135 135 - 145 mmol/L   Potassium 4.0 3.5 - 5.1 mmol/L   Chloride 104 101 - 111 mmol/L   CO2 22 22 - 32 mmol/L   Glucose, Bld 176 (H) 65 - 99 mg/dL   BUN 53 (H) 6 - 20 mg/dL   Creatinine, Ser 2.39 (H) 0.61 - 1.24 mg/dL   Calcium 8.7 (L) 8.9 - 10.3 mg/dL   Total Protein 5.8 (L) 6.5 - 8.1 g/dL    Albumin 3.1 (L) 3.5 - 5.0 g/dL   AST 16 15 - 41 U/L   ALT 19 17 - 63 U/L   Alkaline Phosphatase 88 38 - 126 U/L  Total Bilirubin 0.6 0.3 - 1.2 mg/dL   GFR calc non Af Amer 25 (L) >60 mL/min   GFR calc Af Amer 28 (L) >60 mL/min    Comment: (NOTE) The eGFR has been calculated using the CKD EPI equation. This calculation has not been validated in all clinical situations. eGFR's persistently <60 mL/min signify possible Chronic Kidney Disease.    Anion gap 9 5 - 15    Comment: Performed at [facility] [address], [place], [name] [address]  Glucose, capillary     Status: Abnormal   Collection Time: 09/05/17  7:06 AM  Result Value Ref Range   Glucose-Capillary 167 (H) 65 - 99 mg/dL  Glucose, capillary     Status: Abnormal   Collection Time: 09/05/17 12:06 PM  Result Value Ref Range   Glucose-Capillary 211 (H) 65 - 99 mg/dL  Glucose, capillary     Status: Abnormal   Collection Time: 09/05/17  4:28 PM  Result Value Ref Range   Glucose-Capillary 200 (H) 65 - 99 mg/dL  Glucose, capillary     Status: Abnormal   Collection Time: 09/05/17  9:05 PM  Result Value Ref Range   Glucose-Capillary 233 (H) 65 - 99 mg/dL   Comment 1 Notify RN   Glucose, capillary     Status: Abnormal   Collection Time: 09/06/17  6:52 AM  Result Value Ref Range   Glucose-Capillary 167 (H) 65 - 99 mg/dL  Glucose, capillary     Status: Abnormal   Collection Time: 09/06/17 11:43 AM  Result Value Ref Range   Glucose-Capillary 191 (H) 65 - 99 mg/dL  Glucose, capillary     Status: Abnormal   Collection Time: 09/06/17  4:55 PM  Result Value Ref Range   Glucose-Capillary 133 (H) 65 - 99 mg/dL  Glucose, capillary     Status: Abnormal   Collection Time: 09/06/17  9:07 PM  Result Value Ref Range   Glucose-Capillary 167 (H) 65 - 99 mg/dL  Glucose, capillary     Status: Abnormal   Collection Time: 09/07/17  6:34 AM  Result Value Ref Range   Glucose-Capillary 168 (H) 65 - 99 mg/dL  Glucose, capillary      Status: Abnormal   Collection Time: 09/07/17 11:27 AM  Result Value Ref Range   Glucose-Capillary 135 (H) 65 - 99 mg/dL  Glucose, capillary     Status: Abnormal   Collection Time: 09/07/17  4:47 PM  Result Value Ref Range   Glucose-Capillary 129 (H) 65 - 99 mg/dL  Glucose, capillary     Status: Abnormal   Collection Time: 09/07/17  8:55 PM  Result Value Ref Range   Glucose-Capillary 171 (H) 65 - 99 mg/dL  Glucose, capillary     Status: Abnormal   Collection Time: 09/08/17  6:57 AM  Result Value Ref Range   Glucose-Capillary 144 (H) 65 - 99 mg/dL  Glucose, capillary     Status: Abnormal   Collection Time: 09/08/17 12:21 PM  Result Value Ref Range   Glucose-Capillary 126 (H) 65 - 99 mg/dL  Glucose, capillary     Status: Abnormal   Collection Time: 09/08/17  4:36 PM  Result Value Ref Range   Glucose-Capillary 131 (H) 65 - 99 mg/dL  Glucose, capillary     Status: Abnormal   Collection Time: 09/08/17  9:27 PM  Result Value Ref Range   Glucose-Capillary 168 (H) 65 - 99 mg/dL  Glucose, capillary     Status: Abnormal   Collection  Time: 09/09/17 11:44 AM  Result Value Ref Range   Glucose-Capillary 138 (H) 65 - 99 mg/dL  Glucose, capillary     Status: Abnormal   Collection Time: 09/09/17  5:23 PM  Result Value Ref Range   Glucose-Capillary 130 (H) 65 - 99 mg/dL  Glucose, capillary     Status: Abnormal   Collection Time: 09/09/17 10:03 PM  Result Value Ref Range   Glucose-Capillary 162 (H) 65 - 99 mg/dL  Glucose, capillary     Status: Abnormal   Collection Time: 09/10/17 11:34 AM  Result Value Ref Range   Glucose-Capillary 217 (H) 65 - 99 mg/dL  Glucose, capillary     Status: Abnormal   Collection Time: 09/10/17  4:45 PM  Result Value Ref Range   Glucose-Capillary 134 (H) 65 - 99 mg/dL  Glucose, capillary     Status: Abnormal   Collection Time: 09/10/17  9:03 PM  Result Value Ref Range   Glucose-Capillary 206 (H) 65 - 99 mg/dL   Comment 1 Notify RN   Basic metabolic panel      Status: Abnormal   Collection Time: 09/11/17  5:38 AM  Result Value Ref Range   Sodium 137 135 - 145 mmol/L   Potassium 4.6 3.5 - 5.1 mmol/L   Chloride 106 101 - 111 mmol/L   CO2 24 22 - 32 mmol/L   Glucose, Bld 179 (H) 65 - 99 mg/dL   BUN 74 (H) 6 - 20 mg/dL   Creatinine, Ser 2.76 (H) 0.61 - 1.24 mg/dL   Calcium 9.0 8.9 - 10.3 mg/dL   GFR calc non Af Amer 21 (L) >60 mL/min   GFR calc Af Amer 24 (L) >60 mL/min    Comment: (NOTE) The eGFR has been calculated using the CKD EPI equation. This calculation has not been validated in all clinical situations. eGFR's persistently <60 mL/min signify possible Chronic Kidney Disease.    Anion gap 7 5 - 15    Comment: Performed at [facility] [address] [place], [place] [address]  CBC     Status: Abnormal   Collection Time: 09/11/17  5:38 AM  Result Value Ref Range   WBC 7.0 4.0 - 10.5 K/uL   RBC 3.60 (L) 4.22 - 5.81 MIL/uL   Hemoglobin 10.8 (L) 13.0 - 17.0 g/dL   HCT 32.6 (L) 39.0 - 52.0 %   MCV 90.6 78.0 - 100.0 fL   MCH 30.0 26.0 - 34.0 pg   MCHC 33.1 30.0 - 36.0 g/dL   RDW 14.4 11.5 - 15.5 %   Platelets 161 150 - 400 K/uL    Comment: Performed at [facility], [place] [address] [place], [name] [address]  Glucose, capillary     Status: Abnormal   Collection Time: 09/11/17  6:47 AM  Result Value Ref Range   Glucose-Capillary 167 (H) 65 - 99 mg/dL   Comment 1 Notify RN   Glucose, capillary     Status: Abnormal   Collection Time: 09/11/17 12:16 PM  Result Value Ref Range   Glucose-Capillary 132 (H) 65 - 99 mg/dL  Glucose, capillary     Status: Abnormal   Collection Time: 09/11/17  4:39 PM  Result Value Ref Range   Glucose-Capillary 173 (H) 65 - 99 mg/dL  Glucose, capillary     Status: Abnormal   Collection Time: 09/11/17  9:26 PM  Result Value Ref Range   Glucose-Capillary 171 (H) 65 - 99 mg/dL  Glucose, capillary  Status: Abnormal   Collection Time: 09/12/17  6:34 AM  Result Value Ref Range    Glucose-Capillary 158 (H) 65 - 99 mg/dL  Glucose, capillary     Status: Abnormal   Collection Time: 09/12/17 11:41 AM  Result Value Ref Range   Glucose-Capillary 220 (H) 65 - 99 mg/dL  Basic metabolic panel     Status: Abnormal   Collection Time: 09/26/17 11:26 AM  Result Value Ref Range   Sodium 142 135 - 145 mEq/L   Potassium 4.9 3.5 - 5.1 mEq/L   Chloride 106 96 - 112 mEq/L   CO2 25 19 - 32 mEq/L   Glucose, Bld 264 (H) 70 - 99 mg/dL   BUN 77 (H) 6 - 23 mg/dL   Creatinine, Ser 2.52 (H) 0.40 - 1.50 mg/dL   Calcium 9.7 8.4 - 10.5 mg/dL   GFR 26.50 (L) >60.00 mL/min  Fructosamine     Status: None   Collection Time: 10/24/17 10:50 AM  Result Value Ref Range   Fructosamine 280 0 - 285 umol/L    Comment: Published reference interval for apparently healthy subjects between age 19 and 41 is 33 - 285 umol/L and in a poorly controlled diabetic population is 228 - 563 umol/L with a mean of 396 umol/L.   Microalbumin / creatinine urine ratio     Status: Abnormal   Collection Time: 10/24/17 10:50 AM  Result Value Ref Range   Microalb, Ur 11.9 (H) 0.0 - 1.9 mg/dL   Creatinine,U 135.4 mg/dL   Microalb Creat Ratio 8.8 0.0 - 30.0 mg/g  Basic metabolic panel     Status: Abnormal   Collection Time: 10/24/17 10:50 AM  Result Value Ref Range   Sodium 137 135 - 145 mEq/L   Potassium 5.1 3.5 - 5.1 mEq/L   Chloride 105 96 - 112 mEq/L   CO2 26 19 - 32 mEq/L   Glucose, Bld 296 (H) 70 - 99 mg/dL   BUN 45 (H) 6 - 23 mg/dL   Creatinine, Ser 2.17 (H) 0.40 - 1.50 mg/dL   Calcium 9.4 8.4 - 10.5 mg/dL   GFR 31.48 (L) >60.00 mL/min  Hemoglobin A1c     Status: Abnormal   Collection Time: 10/24/17 10:50 AM  Result Value Ref Range   Hgb A1c MFr Bld 8.3 (H) 4.6 - 6.5 %    Comment: Glycemic Control Guidelines for People with Diabetes:Non Diabetic:  <6%Goal of Therapy: <7%Additional Action Suggested:  >8%     Objective: There were no vitals filed for this visit.  General: Patient is awake, alert,  oriented x 3 and in no acute distress.  Dermatology: Skin is warm and dry bilateral with a continued healed ulceration present left 3rd toe distal tuft, no erythema, no edema. No acute signs of infection. Nails are elongated, thick, and mycotic.    Vascular: Dorsalis Pedis pulse = 1/4 Bilateral,  Posterior Tibial pulse = 1/4 Bilateral,  Capillary Fill Time < 5 seconds, trace edema bilateral.   Neurologic: Protective sensation absent to the level of knee using  the 5.07/10g [employer].  Musculosketal:  No Pain with palpation to healed ulceration. No pain with compression to calves bilateral. Hammertoe and history of toe amps on right.  Assessment and Plan:  Problem List Items Addressed This Visit    None    Visit Diagnoses    Pain due to onychomycosis of toenails of both feet    -  Primary   Diabetic polyneuropathy associated with diabetes mellitus  due to underlying condition ([place])         -Complete examination performed  -Nails mechanically debrided using sterile chisel blade without incident -Continue with elevation of legs and PCP management of edema  and compression stockings and follow up with PCP and cardiologist  -Patient to return to office in 10-12 weeks for follow up diabetic nail care.   Landis Martins, DPM

## 2017-10-31 NOTE — Patient Instructions (Signed)
Continue clopidogrel 75 mg daily  and lipitor  for secondary stroke prevention  Continue to follow up with PCP regarding cholesterol management   Continue to follow up with kidney provider for blood pressure monitoring  Continue to follow up with diabetes doctor  Continue to follow up with heart doctor  Continue therapies as you are making great progress  Continue to monitor blood pressure at home  Maintain strict control of hypertension with blood pressure goal below 130/90, diabetes with hemoglobin A1c goal below 6.5% and cholesterol with LDL cholesterol (bad cholesterol) goal below 70 mg/dL. I also advised the patient to eat a healthy diet with plenty of whole grains, cereals, fruits and vegetables, exercise regularly and maintain ideal body weight.  Followup in the future with me in 4 months or call earlier if needed       Thank you for coming to see [name] at [facility]. I hope we have been able to provide you high quality care today.  You may receive a patient satisfaction survey over the next few weeks. We would appreciate your feedback and comments so that we may continue to improve ourselves and the health of our patients.

## 2017-10-31 NOTE — Progress Notes (Signed)
[facility] [address] [place]. [name] [address] [phone]       OFFICE FOLLOW UP NOTE  Mr. SOTA HETZ Date of Birth:  01-06-1941 Medical Record Number:  [id number]   Reason for Referral:  hospital stroke follow up  CHIEF COMPLAINT:  Chief Complaint  Patient presents with  . Follow-up    Stroke follow up pt seen by Dr. Leonie Man in hospital room 9 pt with [name] with wife    HPI: VINSON TIETZE is being seen today for initial visit in the office for small areas of right frontal cortex posteriorly compatible with acute infarction on 08/30/17. History obtained from patient and chart review. Reviewed all radiology images and labs personally.  Mr. LANGSTON TUBERVILLE is a 77 y.o. male with history of diabetes, hypertension, significant heart disease status post pacemaker placement who presented with L sided weakness x 2 days.  CT head reviewed showed no acute stroke.  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  Carotid Dopplers showed right ICA stenosis of 1 to 39% but study was limited in left carotid and ended due to patient's combativeness.  2D echo showed EF of 60 to 65%.  TEE was negative for thrombus and PFO.  Pacemaker interrogated which did not show evidence of A. fib.  LDL 6 as patient was previously on fish oil was recommended to start Lipitor 10 mg.  A1c 7.6 and recommended close PCP follow-up.  Recommended DAPT of aspirin and Plavix for 3 weeks followed by Plavix alone as patient was previously on aspirin 81 mg.  Therapy is recommended CIR for continued therapies.  Patient is being seen today for hospital follow-up and is accompanied by his wife.  Overall he continues to do well and continues to improve with PT/OT at the neuro rehab clinic [place].  He continues to take Plavix with mild bruising but no bleeding.  Continues to take Lipitor without side effects myalgias.  BP  satisfactory 102/53.  He has returned to all previous activities except for driving as OT has stated that he is not quite there at multitasking at.  He states he feels a very mild weakness in his left side and does use a cane for stability but he did use this prior to the stroke as well.  Denies new or worsening stroke/TIA symptoms.  ROS:   14 system review of systems performed and negative with exception of swelling in legs, easy bruising, joint pain, aching muscles, allergies, and incontinence  PMH:  Past Medical History:  Diagnosis Date  . Anemia, iron deficiency   . Anxiety   . Arthritis   . BPH (benign prostatic hypertrophy)   . CAD (coronary artery disease)    Nonobstructive CAD per cath  . Cardiac pacemaker in situ   . CHF (congestive heart failure) ([place])   . Chronic ulcer of right foot ([place])   . CKD (chronic kidney disease), stage III ([place]) secondary to DM and HTN   nephrologist-  [name]  . Dyspnea   . History of cellulitis    right great toe 10-25-2014  . History of skin cancer   . HOCM (hypertrophic obstructive cardiomyopathy) ([place])   . Hypertension   . 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 Insulin pump in place   . OSA on CPAP   . Peripheral neuropathy    severe  .  Peripheral vascular disease ([place])    bilateral lower extremities  . Rib fracture 07/24/2015  . Secondary hyperparathyroidism of renal origin ([place])   . Sinus node dysfunction ([facility])   . Sleep apnea     PSH:  Past Surgical History:  Procedure Laterality Date  . AMPUTATION OF REPLICATED TOES  Mar 5784   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)  . AMPUTATION TOE Right 03/12/2015   Procedure: RIGHT HALLUS AMPUTATION ;  Surgeon: [name], MD;  Location: [facility];  Service: Podiatry;  Laterality: Right;  . CARDIAC CATHETERIZATION  11-25-2010   [place], [place]   Nonobstructive CAD  . CARDIAC PACEMAKER PLACEMENT  Nov 2009    [employer]  . CHOLECYSTECTOMY N/A 03/05/2017   Procedure: LAPAROSCOPIC CHOLECYSTECTOMY WITH INTRAOPERATIVE CHOLANGIOGRAM;  Surgeon: [name], MD;  Location: [facility];  Service: General;  Laterality: N/A;  . EP IMPLANTABLE DEVICE N/A 06/03/2015   Procedure: PPM Generator Changeout;  Surgeon: [name], MD;  Location: [facility];  Service: Cardiovascular;  Laterality: N/A;  . EXCISION BONE CYST Right 03/06/2015   Procedure: BONE BIOPSIES OF RIGHT FOOT;  Surgeon: [name], MD;  Location: [facility];  Service: Podiatry;  Laterality: Right;  . ORIF ANKLE FRACTURE Left 11/06/2014   Procedure: OPEN REDUCTION INTERNAL FIXATION (ORIF) LEFT  ANKLE FRACTURE;  Surgeon: [name], MD;  Location: [facility];  Service: Orthopedics;  Laterality: Left;  . TEE WITHOUT CARDIOVERSION N/A 09/01/2017   Procedure: TRANSESOPHAGEAL ECHOCARDIOGRAM (TEE);  Surgeon: [name], MD;  Location: [facility];  Service: Cardiovascular;  Laterality: N/A;  . TOTAL KNEE ARTHROPLASTY    . VEIN LIGATION AND STRIPPING      Social History:  Social History   Socioeconomic History  . Marital status: Married    Spouse name: Not on file  . Number of children: 3  . Years of education: 110  . Highest education level: Not on file  Occupational History  . Occupation: Retired  Scientific laboratory technician  . Financial resource strain: Not on file  . Food insecurity:    Worry: Not on file    Inability: Not on file  . Transportation needs:    Medical: Not on file    Non-medical: Not on file  Tobacco Use  . Smoking status: Former Smoker    Packs/day: 2.00    Years: 30.00    Pack years: 60.00    Last attempt to quit: 03/03/1984    Years since quitting: 33.6  . Smokeless tobacco: Never Used  Substance and Sexual Activity  . Alcohol use: Yes    Alcohol/week: 0.6 oz    Types: 1 Glasses of wine per week    Comment: social  . Drug use: No  . Sexual activity: Not Currently  Lifestyle  .  Physical activity:    Days per week: Not on file    Minutes per session: Not on file  . Stress: Not on file  Relationships  . Social connections:    Talks on phone: Not on file    Gets together: Not on file    Attends religious service: Not on file    Active member of club or organization: Not on file    Attends meetings of clubs or organizations: Not on file    Relationship status: Not on file  . Intimate partner violence:    Fear of current or ex partner: Not on file    Emotionally abused: Not on file  Physically abused: Not on file    Forced sexual activity: Not on file  Other Topics Concern  . Not on file  Social History Narrative   Currently resides with his wife. 1 dog. Fun/Hobby: Golf    Denies any religious beliefs effecting health care.     Family History:  Family History  Problem Relation Age of Onset  . Cancer Mother        breast  . Heart attack Father   . Stroke Father     Medications:   Current Outpatient Medications on File Prior to Visit  Medication Sig Dispense Refill  . acetaminophen (TYLENOL) 325 MG tablet Take 1-2 tablets (325-650 mg total) by mouth every 4 (four) hours as needed for mild pain.    Marland Kitchen atorvastatin (LIPITOR) 10 MG tablet Take 1 tablet (10 mg total) by mouth daily at 6 PM. 90 tablet 0  . calcitRIOL (ROCALTROL) 0.25 MCG capsule Take 1 capsule (0.25 mcg total) by mouth daily. 90 capsule 1  . carvedilol (COREG) 25 MG tablet Take 1 tablet (25 mg total) by mouth 2 (two) times daily with a meal. 60 tablet 0  . cholecalciferol (VITAMIN D) 1000 units tablet Take 1 tablet (1,000 Units total) by mouth daily. 30 tablet 1  . clopidogrel (PLAVIX) 75 MG tablet Take 1 tablet (75 mg total) by mouth daily. 30 tablet 0  . diclofenac sodium (VOLTAREN) 1 % GEL Apply 2 g topically 3 (three) times daily. 4 Tube 0  . hydrOXYzine (ATARAX/VISTARIL) 50 MG tablet Take 1 tablet (50 mg total) 3 (three) times daily as needed by mouth. 30 tablet 1  . insulin regular  human CONCENTRATED (HUMULIN R) 500 UNIT/ML injection Inject into the skin.    Marland Kitchen isosorbide mononitrate (IMDUR) 30 MG 24 hr tablet Take 1 tablet (30 mg total) by mouth daily. 30 tablet 0  . liraglutide (VICTOZA) 18 MG/3ML SOPN Inject 0.2 mLs (1.2 mg total) into the skin daily. 2 pen 3  . lisinopril (PRINIVIL,ZESTRIL) 5 MG tablet Take 1 tablet by mouth daily.  6  . pantoprazole (PROTONIX) 40 MG tablet TAKE ONE TABLET BY MOUTH ONCE DAILY 90 tablet 1  . sertraline (ZOLOFT) 100 MG tablet Take 100 mg by mouth daily.    Marland Kitchen torsemide (DEMADEX) 10 MG tablet Take 1 tablet (10 mg total) by mouth 2 (two) times daily. 180 tablet 1  . vitamin B-12 (CYANOCOBALAMIN) 100 MCG tablet Take 100 mcg by mouth daily.     No current facility-administered medications on file prior to visit.     Allergies:   Allergies  Allergen Reactions  . Adhesive [Tape] Other (See Comments)    blisters     Physical Exam  Vitals:   10/31/17 1355  BP: (!) 102/53  Pulse: 62  Weight: 269 lb (122 kg)   Body mass index is 32.74 kg/m. No exam data present  General: well developed, well nourished, pleasant elderly Caucasian male, seated, in no evident distress Head: head normocephalic and atraumatic.   Neck: supple with no carotid or supraclavicular bruits Cardiovascular: regular rate and rhythm, no murmurs Musculoskeletal: no deformity Skin:  no rash/petichiae Vascular:  Normal pulses all extremities  Neurologic Exam Mental Status: Awake and fully alert. Oriented to place and time. Recent and remote memory intact. Attention span, concentration and fund of knowledge appropriate. Mood and affect appropriate.  Cranial Nerves: Fundoscopic exam reveals sharp disc margins. Pupils equal, briskly reactive to light. Extraocular movements full without nystagmus. Visual fields full to confrontation.  Hearing intact. Facial sensation intact. Face, tongue, palate moves normally and symmetrically.  Motor: Normal bulk and tone. Normal  strength in all tested extremity muscles. Sensory.: intact to touch , pinprick , position and vibratory sensation.  Coordination: Rapid alternating movements normal in all extremities. Finger-to-nose and heel-to-shin performed accurately bilaterally.  Very minimal amount of orbiting right arm of her left. Gait and Station: Arises from chair without difficulty. Stance is normal. Gait demonstrates normal stride length and balance . Able to heel, toe and tandem walk without difficulty.  Reflexes: 1+ and symmetric. Toes downgoing.    NIHSS  0 Modified Rankin  2   Diagnostic Data (Labs, Imaging, Testing)  CT HEAD WO CONTRAST  08/30/2017 IMPRESSION: 1. No acute intracranial findings. 2. Atrophy and white matter microvascular disease.  MR BRAIN WO CONTRAST 08/31/2017 IMPRESSION: No acute abnormality The patient was  not able to complete the study ADDENDUM: After further review, there are 2 small areas of restricted diffusion in the right frontal cortex posteriorly compatible with acute infarct.  VAS US CAROTID DUPLEX 08/31/2017 Final Interpretation: Right Carotid: Velocities in the right ICA are consistent with a 1-39% stenosis. Left Carotid: Study limited and ended due to patient was combative. Vertebrals: Not done. Subclavians: Not done.  ECHO COMPLETE 08/31/2017 Study Conclusions - Left ventricle: The cavity size was normal. There was moderate   concentric hypertrophy. Systolic function was normal. The   estimated ejection fraction was in the range of 60% to 65%.   Doppler parameters are consistent with abnormal left ventricular   relaxation (grade 1 diastolic dysfunction). - Left atrium: The atrium was moderately dilated. - Pericardium, extracardiac: A trivial pericardial effusion was   identified.  ECHO TEE  09/01/2017 Negative PFO No evidence of thrombus in the atrial cavity or appendage    ASSESSMENT: David [name] is a 77 y.o. 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 Vascular risk factors include DM, HTN and HLD.     PLAN: -Continue clopidogrel 75 mg daily  and Lipitor for secondary stroke prevention -Advised to continue following up with all outpatient doctors -F/u with PCP regarding your HLD management -Continue PT/OT -patient is cleared to drive from neurology standpoint but did advise patient that he will have to have OT cleared him as well due to cognitive concerns-patient verbalized understanding -continue to monitor BP at home -Continue to monitor interrogation of pacemaker for atrial fibrillation -Maintain strict control of hypertension with blood pressure goal below 130/90, diabetes with hemoglobin A1c goal below 6.5% and cholesterol with LDL cholesterol (bad cholesterol) goal below 70 mg/dL. I also advised the patient to eat a healthy diet with plenty of whole grains, cereals, fruits and vegetables, exercise regularly and maintain ideal body weight.  Follow up in 4 months or call earlier if needed   Greater than 50% of time during this 25 minute visit was spent on counseling,explanation of diagnosis of 2 small right frontal cortical infarcts, reviewing risk factor management of 08/30/2017, planning of further management, discussion with patient and family and coordination of care    Venancio Poisson, [facility]  [facility] [address] [place], [place] [address]  Phone [phone] Fax [phone]

## 2017-11-01 ENCOUNTER — Other Ambulatory Visit: Payer: Self-pay

## 2017-11-01 MED ORDER — FREESTYLE LIBRE 14 DAY SENSOR MISC: 1.0000 | Freq: Every day | 3 refills | Status: DC

## 2017-11-01 MED ORDER — FREESTYLE LIBRE 14 DAY READER DEVI: 1.0000 | Freq: Every day | 0 refills | Status: DC

## 2017-11-01 NOTE — Progress Notes (Signed)
I agree with the above plan 

## 2017-11-02 ENCOUNTER — Ambulatory Visit: Payer: Medicare Other | Admitting: Physical Therapy

## 2017-11-02 ENCOUNTER — Ambulatory Visit: Payer: Medicare Other | Admitting: Occupational Therapy

## 2017-11-07 ENCOUNTER — Encounter: Payer: Medicare Other | Admitting: Occupational Therapy

## 2017-11-07 ENCOUNTER — Ambulatory Visit: Payer: Medicare Other | Admitting: Physical Therapy

## 2017-11-08 ENCOUNTER — Encounter: Payer: Medicare Other | Admitting: Speech Pathology

## 2017-11-08 ENCOUNTER — Telehealth: Payer: Self-pay | Admitting: Endocrinology

## 2017-11-08 NOTE — Telephone Encounter (Signed)
[facility] is calling about request they faxed over to our office. They are needing the last clinical notes faxed to them for the patient   Phone- [phone]

## 2017-11-08 NOTE — Telephone Encounter (Signed)
Last chart notes faxed to [facility].

## 2017-11-09 ENCOUNTER — Ambulatory Visit: Payer: Medicare Other | Admitting: Physical Therapy

## 2017-11-09 ENCOUNTER — Encounter: Payer: Medicare Other | Admitting: Occupational Therapy

## 2017-11-09 ENCOUNTER — Encounter: Payer: Medicare Other | Admitting: Speech Pathology

## 2017-11-13 ENCOUNTER — Ambulatory Visit (INDEPENDENT_AMBULATORY_CARE_PROVIDER_SITE_OTHER): Payer: Medicare Other | Admitting: *Deleted

## 2017-11-13 ENCOUNTER — Ambulatory Visit: Payer: Medicare Other | Admitting: Physical Therapy

## 2017-11-13 ENCOUNTER — Encounter: Payer: Self-pay | Admitting: Physical Therapy

## 2017-11-13 ENCOUNTER — Ambulatory Visit: Payer: Medicare Other | Attending: Physical Medicine & Rehabilitation | Admitting: Occupational Therapy

## 2017-11-13 ENCOUNTER — Telehealth: Payer: Self-pay | Admitting: Cardiology

## 2017-11-13 DIAGNOSIS — I69315 Cognitive social or emotional deficit following cerebral infarction: Secondary | ICD-10-CM | POA: Diagnosis not present

## 2017-11-13 DIAGNOSIS — M6281 Muscle weakness (generalized): Secondary | ICD-10-CM | POA: Insufficient documentation

## 2017-11-13 DIAGNOSIS — I495 Sick sinus syndrome: Secondary | ICD-10-CM | POA: Diagnosis not present

## 2017-11-13 DIAGNOSIS — R2689 Other abnormalities of gait and mobility: Secondary | ICD-10-CM | POA: Insufficient documentation

## 2017-11-13 DIAGNOSIS — R41841 Cognitive communication deficit: Secondary | ICD-10-CM | POA: Diagnosis not present

## 2017-11-13 DIAGNOSIS — R29818 Other symptoms and signs involving the nervous system: Secondary | ICD-10-CM

## 2017-11-13 NOTE — Therapy (Signed)
[facility] [address] [place], [name], [address] Phone: [phone]   Fax:  [phone]  Occupational Therapy Treatment  Patient Details  Name: David [name] MRN: [id number] Date of Birth: 04-04-1941 Referring Provider: Dr. Naaman Plummer   Encounter Date: 11/13/2017  OT End of Session - 11/13/17 1408    Visit Number  9    Number of Visits  17    Date for OT Re-Evaluation  11/18/17    Authorization Type  Medicare/ [employer]    OT Start Time  1404    OT Stop Time  1445    OT Time Calculation (min)  41 min    Activity Tolerance  Patient tolerated treatment well    Behavior During Therapy  [facility] for tasks assessed/performed       Past Medical History:  Diagnosis Date  . Anemia, iron deficiency   . Anxiety   . Arthritis   . BPH (benign prostatic hypertrophy)   . CAD (coronary artery disease)    Nonobstructive CAD per cath  . Cardiac pacemaker in situ   . CHF (congestive heart failure) ([place])   . Chronic ulcer of right foot ([place])   . CKD (chronic kidney disease), stage III ([place]) secondary to DM and HTN   nephrologist-  [name]  . Dyspnea   . History of cellulitis    right great toe 10-25-2014  . History of skin cancer   . HOCM (hypertrophic obstructive cardiomyopathy) ([place])   . Hypertension   . Insulin dependent type 2 diabetes mellitus ([place]) 1991   followd by [name]--  has insulin pump  . Insulin pump in place   . OSA on CPAP   . Peripheral neuropathy    severe  . Peripheral vascular disease ([place])    bilateral lower extremities  . Rib fracture 07/24/2015  . Secondary hyperparathyroidism of renal origin ([place])   . Sinus node dysfunction (HCC)   . Sleep apnea     Past Surgical History:  Procedure Laterality Date  . AMPUTATION OF REPLICATED TOES  Mar 3094   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)  . AMPUTATION TOE Right 03/12/2015   Procedure: RIGHT HALLUS AMPUTATION ;  Surgeon: [name], MD;  Location:  [facility];  Service: Podiatry;  Laterality: Right;  . CARDIAC CATHETERIZATION  11-25-2010   [place], [place]   Nonobstructive CAD  . CARDIAC PACEMAKER PLACEMENT  Nov 2009   [employer]  . CHOLECYSTECTOMY N/A 03/05/2017   Procedure: LAPAROSCOPIC CHOLECYSTECTOMY WITH INTRAOPERATIVE CHOLANGIOGRAM;  Surgeon: [name], MD;  [facility];  Service: General;  Laterality: N/A;  . EP IMPLANTABLE DEVICE N/A 06/03/2015   Procedure: PPM Generator Changeout;  Surgeon: [name], MD;  Location: [facility];  Service: Cardiovascular;  Laterality: N/A;  . EXCISION BONE CYST Right 03/06/2015   Procedure: BONE BIOPSIES OF RIGHT FOOT;  Surgeon: [name], MD;  Location: [facility];  Service: Podiatry;  Laterality: Right;  . ORIF ANKLE FRACTURE Left 11/06/2014   Procedure: OPEN REDUCTION INTERNAL FIXATION (ORIF) LEFT  ANKLE FRACTURE;  Surgeon: [name], MD;  Location: [facility];  Service: Orthopedics;  Laterality: Left;  . TEE WITHOUT CARDIOVERSION N/A 09/01/2017   Procedure: TRANSESOPHAGEAL ECHOCARDIOGRAM (TEE);  Surgeon: [name], MD;  Location: [facility];  Service: Cardiovascular;  Laterality: N/A;  . TOTAL KNEE ARTHROPLASTY    . VEIN LIGATION AND STRIPPING      There were no vitals filed  for this visit.  Subjective Assessment - 11/13/17 1407    Subjective   Denies pain    Pertinent History  .PMH:CVA, history of type 2 diabetes mellitus with neuropathy, CAD, cardiac arrest in hospital, R toe amputations, CKD stage III, age-related macular degeneration    Patient Stated Goals  to get back to prior functional level    Currently in Pain?  No/denies               Treatment: Evironmental scanning to locate items of 2 different colors alternately with 100% accuracy. Scanning activity on I-pad to alphabetize words then alternating aphabetizing task min difficulty/ v.c for accurate performance. Red theraband exercises  reviewed, pt/ wife reports pt was sore and overdid it using green band last OT visits. Therapist down graded theraband to red and reviewed proper positioning, min v.c 10-15 reps each.             OT Short Term Goals - 10/25/17 1222      OT SHORT TERM GOAL #1   Title  I with HEP for UE strength    Status  Achieved      OT SHORT TERM GOAL #2   Title  Pt will verbalize understanding of compensatory strategies for short term memory deficits.    Status  Achieved      OT SHORT TERM GOAL #3   Title  Pt will perform basic home management/ cooking with supervision and no LOB demonstrating good safety awareness.    Time  4    Period  Weeks    Status  Achieved      OT SHORT TERM GOAL #4   Title  Pt demonstrate selective attention to a functional task  in a busy environment x 20 without redirection.    Status  Achieved      OT SHORT TERM GOAL #5   Title  Further assess cogntion and set additional goals prn    Status  Deferred        OT Long Term Goals - 10/30/17 1123      OT LONG TERM GOAL #1   Title  Pt will perform home managment and cooking activities modified independently demonstrating good safety awareness and no LOB.    Status  On-going      OT LONG TERM GOAL #2   Title  Pt will demonstrate ability to perfrom a physical and cogntive task simultaneously with 90% or better accuracy.    Status  On-going      OT LONG TERM GOAL #3   Title  Pt will perform basic money exchange for functional setting with 90% or greater accuracy    Status  Deferred            Plan - 11/13/17 1408    Clinical Impression Statement  Pt is progressing towards goals. He demonstrates improving environmental scanning.    Rehab Potential  Good    Current Impairments/barriers affecting progress:  cogntive deficits, toe amputations, neuropathy affecting mobility    OT Frequency  2x / week    OT Duration  8 weeks    OT Treatment/Interventions  Self-care/ADL training;Therapeutic  exercise;Visual/perceptual remediation/compensation;Patient/family education;Neuromuscular education;Therapeutic activities;Functional Mobility Training;Energy conservation;Cryotherapy;Ultrasound;Passive range of motion;Cognitive remediation/compensation;Manual Therapy;DME and/or AE instruction;Fluidtherapy;Moist Heat;Paraffin    Plan  continue to address divided / altenating attention in a functional context, as well as balance/ endurance for home managment. anticipate d/c next week    Consulted and Agree with Plan of Care  Patient;Family member/caregiver  Patient will benefit from skilled therapeutic intervention in order to improve the following deficits and impairments:  Abnormal gait, Decreased cognition, Decreased knowledge of use of DME, Impaired flexibility, Impaired vision/preception, Decreased mobility, Decreased activity tolerance, Decreased endurance, Impaired UE functional use, Difficulty walking, Decreased safety awareness, Decreased knowledge of precautions, Decreased balance  Visit Diagnosis: Other symptoms and signs involving the nervous system  Cognitive social or emotional deficit following cerebral infarction  Muscle weakness (generalized)    Problem List Patient Active Problem List   Diagnosis Date Noted  . CKD (chronic kidney disease) stage 4, GFR 15-29 ml/min (HCC) 09/26/2017  . 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 Diastolic dysfunction   . Coronary artery disease involving native coronary artery of native heart without angina pectoris   . Cardiac pacemaker in situ   . Atrial fibrillation with rapid ventricular response ([place])   . PVD (peripheral vascular disease) ([place])   . Lower extremity edema 04/21/2017  . Anxiety 03/22/2017  . Acute cholecystitis s/p lap cholecystectomy 03/05/2017 03/04/2017  . HOCM (hypertrophic obstructive cardiomyopathy) ([place]) 03/04/2017  . IDDM (insulin dependent diabetes mellitus) - on  insulin pump 03/04/2017  . Fatigue 01/27/2017  . Low vitamin B12 level 01/27/2017  . Syncope 07/24/2015  . 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 OSA on CPAP 10/26/2014  . Back pain 10/16/2014  . Seasonal and perennial allergic rhinitis 10/25/2013  . Hypertension associated with diabetes ([place]) 10/25/2013  . Hyperlipidemia associated with type 2 diabetes mellitus ([place]) 10/25/2013  . Morbid obesity ([place]) 10/25/2013    Travin [name] 11/13/2017, 4:39 PM  [facility] [address] [place], [name], [address] Phone: [phone]   Fax:  [phone]  Name: David [name] MRN: [id number] Date of Birth: 20-Nov-1940

## 2017-11-13 NOTE — Therapy (Signed)
[facility] [address] [place], [name], [address] Phone: [phone]   Fax:  [phone]  Physical Therapy Treatment  Patient Details  Name: David [name] MRN: [id number] Date of Birth: [birth year] Referring Provider: Dr. Naaman Plummer   Encounter Date: 11/13/2017  PT End of Session - 11/13/17 1548    Visit Number  9    Number of Visits  17    Date for PT Re-Evaluation  12/17/17    Authorization Type  Medicare; [employer]    Authorization Time Period  09/18/17 to 12/17/17    PT Start Time  0246    PT Stop Time  0330    PT Time Calculation (min)  44 min    Equipment Utilized During Treatment  Gait belt    Activity Tolerance  Patient tolerated treatment well low BP 88/48    Behavior During Therapy  WFL for tasks assessed/performed       Past Medical History:  Diagnosis Date  . Anemia, iron deficiency   . Anxiety   . Arthritis   . BPH (benign prostatic hypertrophy)   . CAD (coronary artery disease)    Nonobstructive CAD per cath  . Cardiac pacemaker in situ   . CHF (congestive heart failure) ([place])   . Chronic ulcer of right foot ([place])   . CKD (chronic kidney disease), stage III ([place]) secondary to DM and HTN   nephrologist-  [name]  . Dyspnea   . History of cellulitis    right great toe 10-25-2014  . History of skin cancer   . HOCM (hypertrophic obstructive cardiomyopathy) ([place])   . Hypertension   . Insulin dependent type 2 diabetes mellitus ([place]) 1991   followd by [name]--  has insulin pump  . Insulin pump in place   . OSA on CPAP   . Peripheral neuropathy    severe  . Peripheral vascular disease ([place])    bilateral lower extremities  . Rib fracture 07/24/2015  . Secondary hyperparathyroidism of renal origin ([name])   . Sinus node dysfunction ([facility])   . Sleep apnea     Past Surgical History:  Procedure Laterality Date  . AMPUTATION OF REPLICATED TOES  Mar 6962   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)   . AMPUTATION TOE Right 03/12/2015   Procedure: RIGHT HALLUS AMPUTATION ;  Surgeon: [name], MD;  Location: [facility];  Service: Podiatry;  Laterality: Right;  . CARDIAC CATHETERIZATION  11-25-2010   [place], [place]   Nonobstructive CAD  . CARDIAC PACEMAKER PLACEMENT  Nov 2009   [employer]  . CHOLECYSTECTOMY N/A 03/05/2017   Procedure: LAPAROSCOPIC CHOLECYSTECTOMY WITH INTRAOPERATIVE CHOLANGIOGRAM;  Surgeon: [name], MD;  [facility];  Service: General;  Laterality: N/A;  . EP IMPLANTABLE DEVICE N/A 06/03/2015   Procedure: PPM Generator Changeout;  Surgeon: [name], MD;  Location: [facility];  Service: Cardiovascular;  Laterality: N/A;  . EXCISION BONE CYST Right 03/06/2015   Procedure: BONE BIOPSIES OF RIGHT FOOT;  Surgeon: [name], MD;  Location: [facility];  Service: Podiatry;  Laterality: Right;  . ORIF ANKLE FRACTURE Left 11/06/2014   Procedure: OPEN REDUCTION INTERNAL FIXATION (ORIF) LEFT  ANKLE FRACTURE;  Surgeon: [name], MD;  Location: [facility];  Service: Orthopedics;  Laterality: Left;  . TEE WITHOUT CARDIOVERSION N/A 09/01/2017   Procedure: TRANSESOPHAGEAL ECHOCARDIOGRAM (TEE);  Surgeon: [name], MD;  Location: [facility];  Service: Cardiovascular;  Laterality:  N/A;  . TOTAL KNEE ARTHROPLASTY    . VEIN LIGATION AND STRIPPING      There were no vitals filed for this visit.  Subjective Assessment - 11/13/17 1450    Subjective  Tired from vacation. Pt. states that he has been staying hydrated since the last session. No falls to report.    Patient is accompained by:  Family member    Pertinent History  HTN, morbid obesity, CKD, cardiomyopathy, PPM, macular degeneration, CAD, cardiac arrest in hospital, DM, diabetic neuropathy; PVD, Rt 1-2 toe amputations, partial rt 3rd toe amputation, lt ankle ORIF 6/16    Limitations  Walking    How long can you walk comfortably?  about 10 minutes     Patient Stated Goals  get back to walking without a cane; feeding the birds in his back yard (filling feeders)    Currently in Pain?  No/denies    Multiple Pain Sites  No         [facility] PT Assessment - 11/13/17 1453      Berg Balance Test   Sit to Stand  Able to stand without using hands and stabilize independently    Standing Unsupported  Able to stand safely 2 minutes    Sitting with Back Unsupported but Feet Supported on Floor or Stool  Able to sit safely and securely 2 minutes    Stand to Sit  Sits safely with minimal use of hands    Transfers  Able to transfer safely, minor use of hands    Standing Unsupported with Eyes Closed  Able to stand 10 seconds safely    Standing Ubsupported with Feet Together  Able to place feet together independently and stand 1 minute safely    From Standing, Reach Forward with Outstretched Arm  Can reach forward >12 cm safely (5")    From Standing Position, Pick up Object from Floor  Able to pick up shoe, needs supervision    From Standing Position, Turn to Look Behind Over each Shoulder  Looks behind one side only/other side shows less weight shift    Turn 360 Degrees  Able to turn 360 degrees safely in 4 seconds or less    Standing Unsupported, Alternately Place Feet on Step/Stool  Able to stand independently and complete 8 steps >20 seconds    Standing Unsupported, One Foot in Front  Able to plae foot ahead of the other independently and hold 30 seconds    Standing on One Leg  Able to lift leg independently and hold equal to or more than 3 seconds    Total Score  49      Timed Up and Go Test   Normal TUG (seconds)  9.59 with cane         [facility] Adult PT Treatment/Exercise - 11/13/17 1534      Ambulation/Gait   Ambulation/Gait Assistance  5: Supervision    Ambulation/Gait Assistance Details  pt has been ambulating around his home without his assistive device but says he still takes it with him when he leaves home. pt requires supervision to  ambulate safely due to slight veering in gait and LOBs with good recoveries on the part of the patient. Pt. ambulated for approx. 250 ft. on level, indoor surface without an assistive device and for approximately 40 ft. on a compliant surface (red mat) to challenge balance without an assistive device. Pt. required min-mod assist on the compliant surface due to LOB and inability to recover without help.  PT Education - 11/13/17 1546    Education provided  Yes    Education Details  Review of the signs and symptoms of a stroke and what to do if CVA is suspected.    Person(s) Educated  Patient;Spouse    Methods  Explanation    Comprehension  Verbalized understanding       PT Short Term Goals - 11/13/17 1550      PT SHORT TERM GOAL #1   Title  Patient will complete HEP with supervision of family to assure proper technique. (Target for all STGs 11/01/2017--date reflects 4 weeks after 1st treatment 10/03/17)    Time  4    Period  Weeks    Status  Achieved      PT SHORT TERM GOAL #2   Title  Patient will improve TUG normal to <13.5 seconds with SPC to demonstrate lesser fall risk.     Baseline  11/13/2017 Goal met today 9.59 sec with SPC.    Time  4    Period  Weeks    Status  Achieved      PT SHORT TERM GOAL #3   Title  Patient will improve Berg score >=44/56 demonstrating adequate balance for use of cane indoors.     Baseline  11/13/2017 Met today. 49/56    Time  4    Period  Weeks    Status  Achieved      PT SHORT TERM GOAL #4   Title  Patient can verbalize signs/symptoms of CVA, appropriate actions to take, and understanding of need to get to hospital within 3 hours of onset of symptoms    Baseline  11/13/2017 Met today.    Time  4    Period  Weeks    Status  Achieved        PT Long Term Goals - 11/13/17 1553      PT LONG TERM GOAL #1   Title  Patient will be independent with HEP (using handouts) for balance and strengthening. (Target for all LTG's 12/02/2017--based on late  start date)    Time  8    Period  Weeks    Status  On-going      PT LONG TERM GOAL #2   Title  Patient will improve gait velocity to >=2.62 ft/sec with least restrictive assistive device (indicative of velocity for safe community ambulation)    Time  8    Period  Weeks    Status  On-going      PT LONG TERM GOAL #3   Title  Patient will improve Berg to >=47/56 indicative of safe use of cane outdoors.     Baseline  11/13/2017 Met today 49/56.    Time  8    Period  Weeks    Status  Achieved      PT LONG TERM GOAL #4   Title  Patient able to ambulate >= 200 ft outdoors with cane modified independent over unlevel ground to allow safe walking to his bird feeders in his back yard.     Time  8    Period  Weeks    Status  On-going        Plan - 11/13/17 1549    Clinical Impression Statement  Today's session focused on addressing the STG's and the patient's goal of ambulating without his cane. All of the STG's were achieved and progress was made toward the LTG's and the BERG LTG was achieved. Pt. would benefit from continued PT  to reach remaining goals.    Rehab Potential  Good    Clinical Impairments Affecting Rehab Potential  - Multiple co-morbidities; + very motivated    PT Frequency  2x / week    PT Duration  8 weeks    PT Treatment/Interventions  ADLs/Self Care Home Management;Aquatic Therapy;Electrical Stimulation;Gait training;DME Instruction;Stair training;Functional mobility training;Therapeutic activities;Therapeutic exercise;Balance training;Neuromuscular re-education;Cognitive remediation;Manual techniques;Orthotic Fit/Training;Patient/family education;Passive range of motion    PT Next Visit Plan  Gait without assistive device on compliant surface, LE strengthening, general balance    PT Home Exercise Plan  reports no HEP given on CIR, wife confirms    Consulted and Agree with Plan of Care  Patient;Family member/caregiver    Family Member Consulted  wife       Patient will  benefit from skilled therapeutic intervention in order to improve the following deficits and impairments:  Abnormal gait, Decreased activity tolerance, Decreased balance, Decreased cognition, Decreased mobility, Decreased knowledge of use of DME, Decreased strength, Increased edema, Impaired sensation, Postural dysfunction, Impaired UE functional use, Obesity  Visit Diagnosis: Other symptoms and signs involving the nervous system  Muscle weakness (generalized)  Other abnormalities of gait and mobility     Problem List Patient Active Problem List   Diagnosis Date Noted  . CKD (chronic kidney disease) stage 4, GFR 15-29 ml/min (HCC) 09/26/2017  . 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 Diastolic dysfunction   . Coronary artery disease involving native coronary artery of native heart without angina pectoris   . Cardiac pacemaker in situ   . Atrial fibrillation with rapid ventricular response ([place])   . PVD (peripheral vascular disease) ([place])   . Lower extremity edema 04/21/2017  . Anxiety 03/22/2017  . Acute cholecystitis s/p lap cholecystectomy 03/05/2017 03/04/2017  . HOCM (hypertrophic obstructive cardiomyopathy) ([place]) 03/04/2017  . 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 Fatigue 01/27/2017  . Low vitamin B12 level 01/27/2017  . Syncope 07/24/2015  . 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 OSA on CPAP 10/26/2014  . Back pain 10/16/2014  . Seasonal and perennial allergic rhinitis 10/25/2013  . Hypertension associated with diabetes ([place]) 10/25/2013  . Hyperlipidemia associated with type 2 diabetes mellitus ([place]) 10/25/2013  . Morbid obesity ([place]) 10/25/2013    Arthor Captain, SPTA 11/13/2017, 4:02 PM  [facility] [address] [place], [name], [address] Phone: [phone]   Fax:  [phone]  Name: David [name] MRN: [id number] Date of Birth: Sep 11, 1940

## 2017-11-13 NOTE — Telephone Encounter (Signed)
Confirmed remote transmission w/ pt wife.   

## 2017-11-14 NOTE — Progress Notes (Signed)
Remote pacemaker transmission.   

## 2017-11-15 ENCOUNTER — Other Ambulatory Visit: Payer: Self-pay | Admitting: Family

## 2017-11-15 ENCOUNTER — Ambulatory Visit: Payer: Medicare Other

## 2017-11-15 ENCOUNTER — Encounter: Payer: Self-pay | Admitting: Physical Therapy

## 2017-11-15 ENCOUNTER — Ambulatory Visit: Payer: Medicare Other | Admitting: Physical Therapy

## 2017-11-15 ENCOUNTER — Ambulatory Visit: Payer: Medicare Other | Admitting: Occupational Therapy

## 2017-11-15 DIAGNOSIS — R29818 Other symptoms and signs involving the nervous system: Secondary | ICD-10-CM

## 2017-11-15 DIAGNOSIS — M6281 Muscle weakness (generalized): Secondary | ICD-10-CM

## 2017-11-15 DIAGNOSIS — R2689 Other abnormalities of gait and mobility: Secondary | ICD-10-CM

## 2017-11-15 DIAGNOSIS — R41841 Cognitive communication deficit: Secondary | ICD-10-CM | POA: Diagnosis not present

## 2017-11-15 DIAGNOSIS — I69315 Cognitive social or emotional deficit following cerebral infarction: Secondary | ICD-10-CM | POA: Diagnosis not present

## 2017-11-15 NOTE — Therapy (Signed)
[facility] [address] [place], [name], [address] Phone: [phone]   Fax:  [phone]  Physical Therapy Treatment  Patient Details  Name: David [name] MRN: [id number] Date of Birth: 04/18/1941 Referring Provider: Dr. Naaman Plummer   Encounter Date: 11/15/2017  PT End of Session - 11/15/17 1450    Visit Number  10    Number of Visits  17    Date for PT Re-Evaluation  12/17/17    Authorization Type  Medicare; [employer]    Authorization Time Period  09/18/17 to 12/17/17    PT Start Time  1447    PT Stop Time  1528    PT Time Calculation (min)  41 min    Equipment Utilized During Treatment  Gait belt    Activity Tolerance  Patient tolerated treatment well low BP 88/48    Behavior During Therapy  WFL for tasks assessed/performed       Past Medical History:  Diagnosis Date  . Anemia, iron deficiency   . Anxiety   . Arthritis   . BPH (benign prostatic hypertrophy)   . CAD (coronary artery disease)    Nonobstructive CAD per cath  . Cardiac pacemaker in situ   . CHF (congestive heart failure) ([place])   . Chronic ulcer of right foot ([place])   . CKD (chronic kidney disease), stage III ([facility]) secondary to DM and HTN   nephrologist-  [name]  . Dyspnea   . History of cellulitis    right great toe 10-25-2014  . History of skin cancer   . HOCM (hypertrophic obstructive cardiomyopathy) ([place])   . Hypertension   . Insulin dependent type 2 diabetes mellitus ([facility] [facility]) 1991   followd by [name]--  has insulin pump  . Insulin pump in place   . OSA on CPAP   . Peripheral neuropathy    severe  . Peripheral vascular disease ([facility])    bilateral lower extremities  . Rib fracture 07/24/2015  . Secondary hyperparathyroidism of renal origin ([facility])   . Sinus node dysfunction ([facility])   . Sleep apnea     Past Surgical History:  Procedure Laterality Date  . AMPUTATION OF REPLICATED TOES  Mar 5701   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)   . AMPUTATION TOE Right 03/12/2015   Procedure: RIGHT HALLUS AMPUTATION ;  Surgeon: [name], MD;  Location: [facility];  Service: Podiatry;  Laterality: Right;  . CARDIAC CATHETERIZATION  11-25-2010   [place], [place]   Nonobstructive CAD  . CARDIAC PACEMAKER PLACEMENT  Nov 2009   [employer]  . CHOLECYSTECTOMY N/A 03/05/2017   Procedure: LAPAROSCOPIC CHOLECYSTECTOMY WITH INTRAOPERATIVE CHOLANGIOGRAM;  Surgeon: [name], MD;  Location: [facility];  Service: General;  Laterality: N/A;  . EP IMPLANTABLE DEVICE N/A 06/03/2015   Procedure: PPM Generator Changeout;  Surgeon: [name], MD;  Location: [facility];  Service: Cardiovascular;  Laterality: N/A;  . EXCISION BONE CYST Right 03/06/2015   Procedure: BONE BIOPSIES OF RIGHT FOOT;  Surgeon: [name], MD;  Location: [facility];  Service: Podiatry;  Laterality: Right;  . ORIF ANKLE FRACTURE Left 11/06/2014   Procedure: OPEN REDUCTION INTERNAL FIXATION (ORIF) LEFT  ANKLE FRACTURE;  Surgeon: [name], MD;  Location: [facility];  Service: Orthopedics;  Laterality: Left;  . TEE WITHOUT CARDIOVERSION N/A 09/01/2017   Procedure: TRANSESOPHAGEAL ECHOCARDIOGRAM (TEE);  Surgeon: [name], MD;  Location: [facility];  Service: Cardiovascular;  Laterality:  N/A;  . TOTAL KNEE ARTHROPLASTY    . VEIN LIGATION AND STRIPPING      There were no vitals filed for this visit.  Subjective Assessment - 11/15/17 1449    Subjective  No falls to report.     Patient is accompained by:  Family member    Pertinent History  HTN, morbid obesity, CKD, cardiomyopathy, PPM, macular degeneration, CAD, cardiac arrest in hospital, DM, diabetic neuropathy; PVD, Rt 1-2 toe amputations, partial rt 3rd toe amputation, lt ankle ORIF 6/16    Limitations  Walking    How long can you walk comfortably?  about 10 minutes    Patient Stated Goals  get back to walking without a cane; feeding the birds in his back yard  (filling feeders)    Currently in Pain?  No/denies    Multiple Pain Sites  No         Balance: Blue mat on incline stepping forward and back over seam 10x bil uphill forward, 10x bil downhill forward, 10x bil downhill backward, 5x bil down uphill backward due to fatigue min- mod assist for all due to LOB and pt's need for SPTA's assistance for recovery of balance. Gait: 2 laps (approx. 230 feet) supervision, no assistive device, level, indoor surface, head turns side <> side 30 feet, up <> down 30 feet, pt. had to stop before beginning head turns to see where he was so assistance turned into a min guard and he had slight veering of course. Stepping from floor to [employer] to 5" step + March: min guard due to LOB but pt was able to regain balance on his own without assistance from SPTA. Pt. fatigued and exercise was terminated before full set was complete. He was able to do 6 reps bil.    PT Short Term Goals - 11/13/17 1550      PT SHORT TERM GOAL #1   Title  Patient will complete HEP with supervision of family to assure proper technique. (Target for all STGs 11/01/2017--date reflects 4 weeks after 1st treatment 10/03/17)    Time  4    Period  Weeks    Status  Achieved      PT SHORT TERM GOAL #2   Title  Patient will improve TUG normal to <13.5 seconds with SPC to demonstrate lesser fall risk.     Baseline  11/13/2017 Goal met today 9.59 sec with SPC.    Time  4    Period  Weeks    Status  Achieved      PT SHORT TERM GOAL #3   Title  Patient will improve Berg score >=44/56 demonstrating adequate balance for use of cane indoors.     Baseline  11/13/2017 Met today. 49/56    Time  4    Period  Weeks    Status  Achieved      PT SHORT TERM GOAL #4   Title  Patient can verbalize signs/symptoms of CVA, appropriate actions to take, and understanding of need to get to hospital within 3 hours of onset of symptoms    Baseline  11/13/2017 Met today.    Time  4    Period  Weeks    Status  Achieved         PT Long Term Goals - 11/15/17 1537      PT LONG TERM GOAL #1   Title  Patient will be independent with HEP (using handouts) for balance and strengthening. (Target for all LTG's 12/02/2017--based on  late start date)    Time  8    Period  Weeks    Status  On-going      PT LONG TERM GOAL #2   Title  Patient will improve gait velocity to >=2.62 ft/sec with least restrictive assistive device (indicative of velocity for safe community ambulation)    Time  8    Period  Weeks    Status  On-going      PT LONG TERM GOAL #3   Title  Patient will improve Berg to >=47/56 indicative of safe use of cane outdoors.     Baseline  11/13/2017 Met today 49/56.    Time  8    Period  Weeks    Status  Achieved      PT LONG TERM GOAL #4   Title  Patient able to ambulate >= 200 ft outdoors with cane modified independent over unlevel ground to allow safe walking to his bird feeders in his back yard.     Baseline  11/15/2017 Met on 10/30/2017 in note by Barry Brunner.    Time  8    Period  Weeks    Status  Achieved        Plan - 11/15/17 1532    Clinical Impression Statement  Today's session focused on balance and gait without an assistive device in preparation for reviewing the LTG's next week. session was ended a couple min early due to patient's fatigue. Pt. would benefit from continued PT to work on remaining unmet goals.    Rehab Potential  Good    Clinical Impairments Affecting Rehab Potential  - Multiple co-morbidities; + very motivated    PT Frequency  2x / week    PT Duration  8 weeks    PT Treatment/Interventions  ADLs/Self Care Home Management;Aquatic Therapy;Electrical Stimulation;Gait training;DME Instruction;Stair training;Functional mobility training;Therapeutic activities;Therapeutic exercise;Balance training;Neuromuscular re-education;Cognitive remediation;Manual techniques;Orthotic Fit/Training;Patient/family education;Passive range of motion    PT Next Visit Plan  Continue to challenge  patient's balance on compliant surfaces without his assistive device and continue to address LE strengthening.    PT Home Exercise Plan  reports no HEP given on CIR, wife confirms    Consulted and Agree with Plan of Care  Patient;Family member/caregiver    Family Member Consulted  wife       Patient will benefit from skilled therapeutic intervention in order to improve the following deficits and impairments:  Abnormal gait, Decreased activity tolerance, Decreased balance, Decreased cognition, Decreased mobility, Decreased knowledge of use of DME, Decreased strength, Increased edema, Impaired sensation, Postural dysfunction, Impaired UE functional use, Obesity  Visit Diagnosis: Other abnormalities of gait and mobility  Muscle weakness (generalized)  Other symptoms and signs involving the nervous system     Problem List Patient Active Problem List   Diagnosis Date Noted  . CKD (chronic kidney disease) stage 4, GFR 15-29 ml/min (HCC) 09/26/2017  . 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 Diastolic dysfunction   . Coronary artery disease involving native coronary artery of native heart without angina pectoris   . Cardiac pacemaker in situ   . Atrial fibrillation with rapid ventricular response ([facility])   . PVD (peripheral vascular disease) ([facility])   . Lower extremity edema 04/21/2017  . Anxiety 03/22/2017  . Acute cholecystitis s/p lap cholecystectomy 03/05/2017 03/04/2017  . HOCM (hypertrophic obstructive cardiomyopathy) ([place]) 03/04/2017  . 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 Fatigue 01/27/2017  .  Low vitamin B12 level 01/27/2017  . Syncope 07/24/2015  . 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 OSA on CPAP 10/26/2014  . Back pain 10/16/2014  . Seasonal and perennial allergic rhinitis 10/25/2013  . Hypertension associated with diabetes ([facility]) 10/25/2013  . Hyperlipidemia associated  with type 2 diabetes mellitus ([place]) 10/25/2013  . Morbid obesity ([facility]) 10/25/2013    Arthor Captain, SPTA 11/15/2017, 4:17 PM  [facility] [address] [place], [name], [address] Phone: [phone]   Fax:  [phone]  Name: ARJUN HARD MRN: [id number] Date of Birth: 08/27/1940

## 2017-11-15 NOTE — Therapy (Signed)
[facility] [address] [place], [name], [address] Phone: [phone]   Fax:  [phone]  Occupational Therapy Treatment  Patient Details  Name: David [name] MRN: [id number] Date of Birth: 1940/10/03 Referring Provider: Dr. Naaman Plummer   Encounter Date: 11/15/2017  OT End of Session - 11/15/17 1418    Visit Number  10    Number of Visits  17    Date for OT Re-Evaluation  11/18/17    Authorization Type  Medicare/ [employer]    OT Start Time  1406    OT Stop Time  1445    OT Time Calculation (min)  39 min    Activity Tolerance  Patient tolerated treatment well    Behavior During Therapy  [facility] for tasks assessed/performed       Past Medical History:  Diagnosis Date  . Anemia, iron deficiency   . Anxiety   . Arthritis   . BPH (benign prostatic hypertrophy)   . CAD (coronary artery disease)    Nonobstructive CAD per cath  . Cardiac pacemaker in situ   . CHF (congestive heart failure) ([facility])   . Chronic ulcer of right foot ([facility])   . CKD (chronic kidney disease), stage III ([facility]) secondary to DM and HTN   nephrologist-  [name]  . Dyspnea   . History of cellulitis    right great toe 10-25-2014  . History of skin cancer   . HOCM (hypertrophic obstructive cardiomyopathy) ([facility])   . Hypertension   . Insulin dependent type 2 diabetes mellitus ([facility]) 1991   followd by [name]--  has insulin pump  . Insulin pump in place   . OSA on CPAP   . Peripheral neuropathy    severe  . Peripheral vascular disease ([place])    bilateral lower extremities  . Rib fracture 07/24/2015  . Secondary hyperparathyroidism of renal origin ([place])   . Sinus node dysfunction ([facility])   . Sleep apnea     Past Surgical History:  Procedure Laterality Date  . AMPUTATION OF REPLICATED TOES  Mar 5573   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)  . AMPUTATION TOE Right 03/12/2015   Procedure: RIGHT HALLUS AMPUTATION ;  Surgeon: [name], MD;  Location:  [facility];  Service: Podiatry;  Laterality: Right;  . CARDIAC CATHETERIZATION  11-25-2010   [place], [place]   Nonobstructive CAD  . CARDIAC PACEMAKER PLACEMENT  Nov 2009   [employer]  . CHOLECYSTECTOMY N/A 03/05/2017   Procedure: LAPAROSCOPIC CHOLECYSTECTOMY WITH INTRAOPERATIVE CHOLANGIOGRAM;  Surgeon: [name], MD;  [facility];  Service: General;  Laterality: N/A;  . EP IMPLANTABLE DEVICE N/A 06/03/2015   Procedure: PPM Generator Changeout;  Surgeon: [name], MD;  Location: [facility];  Service: Cardiovascular;  Laterality: N/A;  . EXCISION BONE CYST Right 03/06/2015   Procedure: BONE BIOPSIES OF RIGHT FOOT;  Surgeon: [name], MD;  Location: [facility];  Service: Podiatry;  Laterality: Right;  . ORIF ANKLE FRACTURE Left 11/06/2014   Procedure: OPEN REDUCTION INTERNAL FIXATION (ORIF) LEFT  ANKLE FRACTURE;  Surgeon: [name], MD;  Location: [facility];  Service: Orthopedics;  Laterality: Left;  . TEE WITHOUT CARDIOVERSION N/A 09/01/2017   Procedure: TRANSESOPHAGEAL ECHOCARDIOGRAM (TEE);  Surgeon: [name], MD;  Location: [facility];  Service: Cardiovascular;  Laterality: N/A;  . TOTAL KNEE ARTHROPLASTY    . VEIN LIGATION AND STRIPPING      There were no vitals filed  for this visit.  Subjective Assessment - 11/15/17 1417    Subjective   Denies pain    Patient Stated Goals  to get back to prior functional level    Currently in Pain?  No/denies          Treatment Completing a 24 piece puzzle for visual perceptual skills and problem solving, min difficulty, increased time and min v.c. Ambulating while tossing ball without cane, 1 LOB, but pt was able to recover by himself, close supervision. Then multi tasking to name animals for each letter of alphabet while amb with cane grossly 80-90% accuracy. Alternating attention task to alphabetize words alternating between upper and lower case,  V.c to get  started, then pt performed correctly.                  OT Short Term Goals - 10/25/17 1222      OT SHORT TERM GOAL #1   Title  I with HEP for UE strength    Status  Achieved      OT SHORT TERM GOAL #2   Title  Pt will verbalize understanding of compensatory strategies for short term memory deficits.    Status  Achieved      OT SHORT TERM GOAL #3   Title  Pt will perform basic home management/ cooking with supervision and no LOB demonstrating good safety awareness.    Time  4    Period  Weeks    Status  Achieved      OT SHORT TERM GOAL #4   Title  Pt demonstrate selective attention to a functional task  in a busy environment x 20 without redirection.    Status  Achieved      OT SHORT TERM GOAL #5   Title  Further assess cogntion and set additional goals prn    Status  Deferred        OT Long Term Goals - 11/15/17 1420      OT LONG TERM GOAL #1   Title  Pt will perform home managment and cooking activities modified independently demonstrating good safety awareness and no LOB.    Status  On-going      OT LONG TERM GOAL #2   Title  Pt will demonstrate ability to perfrom a physical and cogntive task simultaneously with 90% or better accuracy.    Status  On-going      OT LONG TERM GOAL #3   Title  Pt will perform basic money exchange for functional setting with 90% or greater accuracy    Status  Deferred Pt's wife handles finances            Plan - 11/15/17 1421    Clinical Impression Statement  Pt is progressing towards goals. Pt's wife reports pt is more active at home and his endurance is better.    Rehab Potential  Good    Current Impairments/barriers affecting progress:  cogntive deficits, toe amputations, neuropathy affecting mobility    OT Frequency  2x / week    OT Duration  8 weeks    OT Treatment/Interventions  Self-care/ADL training;Therapeutic exercise;Visual/perceptual remediation/compensation;Patient/family education;Neuromuscular  education;Therapeutic activities;Functional Mobility Training;Energy conservation;Cryotherapy;Ultrasound;Passive range of motion;Cognitive remediation/compensation;Manual Therapy;DME and/or AE instruction;Fluidtherapy;Moist Heat;Paraffin    Plan  continue to address divided / altenating attention in a functional context, as well as balance/ endurance for home managment. anticipate d/c next week    Consulted and Agree with Plan of Care  Patient;Family member/caregiver  Patient will benefit from skilled therapeutic intervention in order to improve the following deficits and impairments:  Abnormal gait, Decreased cognition, Decreased knowledge of use of DME, Impaired flexibility, Impaired vision/preception, Decreased mobility, Decreased activity tolerance, Decreased endurance, Impaired UE functional use, Difficulty walking, Decreased safety awareness, Decreased knowledge of precautions, Decreased balance  Visit Diagnosis: Other symptoms and signs involving the nervous system  Muscle weakness (generalized)  Cognitive social or emotional deficit following cerebral infarction    Problem List Patient Active Problem List   Diagnosis Date Noted  . CKD (chronic kidney disease) stage 4, GFR 15-29 ml/min (HCC) 09/26/2017  . 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 Diastolic dysfunction   . Coronary artery disease involving native coronary artery of native heart without angina pectoris   . Cardiac pacemaker in situ   . Atrial fibrillation with rapid ventricular response ([facility] [place])   . PVD (peripheral vascular disease) ([facility])   . Lower extremity edema 04/21/2017  . Anxiety 03/22/2017  . Acute cholecystitis s/p lap cholecystectomy 03/05/2017 03/04/2017  . HOCM (hypertrophic obstructive cardiomyopathy) ([place] [facility]) 03/04/2017  . 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 Fatigue 01/27/2017  . Low vitamin B12 level 01/27/2017  .  Syncope 07/24/2015  . 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 OSA on CPAP 10/26/2014  . Back pain 10/16/2014  . Seasonal and perennial allergic rhinitis 10/25/2013  . Hypertension associated with diabetes ([place]) 10/25/2013  . Hyperlipidemia associated with type 2 diabetes mellitus ([facility]) 10/25/2013  . Morbid obesity ([place] [facility]) 10/25/2013    Shawndell Varas 11/15/2017, 2:25 PM  [facility] [address] [place] [place], [name], [address] Phone: [phone]   Fax:  [phone]  Name: MILO SCHREIER MRN: [id number] Date of Birth: April 03, 1941

## 2017-11-15 NOTE — Patient Instructions (Signed)
  Please complete the assigned speech therapy homework prior to your next session and return it to the speech therapist at your next visit.  

## 2017-11-15 NOTE — Therapy (Signed)
[facility] [address] [place], [name], [address] Phone: [phone]   Fax:  [phone]  Speech Language Pathology Treatment  Patient Details  Name: David [name] MRN: [id number] Date of Birth: January 14, 1941 Referring Provider: Dr. Naaman Plummer   Encounter Date: 11/15/2017  End of Session - 11/15/17 1736    Visit Number  2    Number of Visits  17    Date for SLP Re-Evaluation  12/22/17    SLP Start Time  1319    SLP Stop Time   1401    SLP Time Calculation (min)  42 min    Activity Tolerance  Patient tolerated treatment well       Past Medical History:  Diagnosis Date  . Anemia, iron deficiency   . Anxiety   . Arthritis   . BPH (benign prostatic hypertrophy)   . CAD (coronary artery disease)    Nonobstructive CAD per cath  . Cardiac pacemaker in situ   . CHF (congestive heart failure) ([facility])   . Chronic ulcer of right foot ([facility])   . CKD (chronic kidney disease), stage III ([facility]) secondary to DM and HTN   nephrologist-  [name]  . Dyspnea   . History of cellulitis    right great toe 10-25-2014  . History of skin cancer   . HOCM (hypertrophic obstructive cardiomyopathy) ([facility])   . Hypertension   . Insulin dependent type 2 diabetes mellitus ([facility]) 1991   followd by [name]--  has insulin pump  . Insulin pump in place   . OSA on CPAP   . Peripheral neuropathy    severe  . Peripheral vascular disease ([facility])    bilateral lower extremities  . Rib fracture 07/24/2015  . Secondary hyperparathyroidism of renal origin ([facility])   . Sinus node dysfunction ([facility])   . Sleep apnea     Past Surgical History:  Procedure Laterality Date  . AMPUTATION OF REPLICATED TOES  Mar 2774   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)  . AMPUTATION TOE Right 03/12/2015   Procedure: RIGHT HALLUS AMPUTATION ;  Surgeon: [name], MD;  Location: [facility];  Service: Podiatry;  Laterality: Right;  . CARDIAC CATHETERIZATION   11-25-2010   [place], [place]   Nonobstructive CAD  . CARDIAC PACEMAKER PLACEMENT  Nov 2009   [employer]  . CHOLECYSTECTOMY N/A 03/05/2017   Procedure: LAPAROSCOPIC CHOLECYSTECTOMY WITH INTRAOPERATIVE CHOLANGIOGRAM;  Surgeon: [name], MD;  Location[facility];  Service: General;  Laterality: N/A;  . EP IMPLANTABLE DEVICE N/A 06/03/2015   Procedure: PPM Generator Changeout;  Surgeon: [name], MD;  Location: [facility];  Service: Cardiovascular;  Laterality: N/A;  . EXCISION BONE CYST Right 03/06/2015   Procedure: BONE BIOPSIES OF RIGHT FOOT;  Surgeon: [name], MD;  Location: [facility];  Service: Podiatry;  Laterality: Right;  . ORIF ANKLE FRACTURE Left 11/06/2014   Procedure: OPEN REDUCTION INTERNAL FIXATION (ORIF) LEFT  ANKLE FRACTURE;  Surgeon: [name], MD;  Location: [facility];  Service: Orthopedics;  Laterality: Left;  . TEE WITHOUT CARDIOVERSION N/A 09/01/2017   Procedure: TRANSESOPHAGEAL ECHOCARDIOGRAM (TEE);  Surgeon: [name], MD;  Location: [facility];  Service: Cardiovascular;  Laterality: N/A;  . TOTAL KNEE ARTHROPLASTY    . VEIN LIGATION AND STRIPPING      There were no vitals filed for this visit.  Subjective Assessment - 11/15/17 1325    Patient is accompained by:  -- wife  Currently in Pain?  No/denies            ADULT SLP TREATMENT - 11/15/17 1329      General Information   Behavior/Cognition  Alert;Cooperative;Pleasant mood      Treatment Provided   Treatment provided  Cognitive-Linquistic      Cognitive-Linquistic Treatment   Treatment focused on  Cognition    Skilled Treatment  Pt is using a calendar at home to track appointments and other engagements. With medications - pt has been independent with all but his injection (Victoza). Pt is sitting with his wife while he fills his medbox; pt/wife have done this cycle every two weeks three times - no errors in those three times, per wife. With  calendar/appointmetn management, wife will not remind pt the day before. Pt reports one of the first things he does in AM is look at the calendar. Goal/s will be modified. In a simple written deductive reasoning task to target attention and reasoning, pt req'd usual mod-max cues for reasoning and for awareness. Pt stated, "That was really hard for me." SLP affirmed this and told pt/wife that this was one result from CVA. Pt desired another reasoning task be given as homework however SLP told him it would be best to work on this during [place] session with SLP due to amount of cueing needed.       Assessment / Recommendations / Plan   Plan  Goals updated      Progression Toward Goals   Progression toward goals  Progressing toward goals       SLP Education - 11/15/17 1736    Education provided  Yes    Education Details  deficit areas    Person(s) Educated  Patient;Spouse    Methods  Explanation    Comprehension  Verbalized understanding       SLP Short Term Goals - 11/15/17 1324      SLP SHORT TERM GOAL #1   Title  Pt will selectively attend to mod complex cognitive linguistic task for 10 minutes with distractions and occasional min redirection over 3 sessions    Time  4    Period  Weeks    Status  On-going      SLP SHORT TERM GOAL #2   Title  Pt will complete mod complex organization, reasoning, problem solving tasks with 85% accuracy and occasional min A over 3 sessions    Time  4    Period  Weeks    Status  On-going      SLP SHORT TERM GOAL #3   Title  Pt will ID errors and self correct 3/5 errors with occasional min A over 3 sessions    Time  4    Period  Weeks    Status  On-going      SLP SHORT TERM GOAL #4   Title  Pt will utilize external aids for medication, schedule management with occasional min A from family outside of therapy over 3 sessions    Baseline  11-15-17    Time  4    Period  Weeks    Status  Revised       SLP Long Term Goals - 11/15/17 1740      SLP LONG  TERM GOAL #1   Title  Pt will alternate attention between 2 mod complex cognitive linguistic tasks for 15 minutes with 85% on each and rare min A over 3 sessions    Time  8    Period  Weeks    Status  On-going      SLP LONG TERM GOAL #2   Title  Pt will self correct errors 4/5 errors with occasional min A over 3 sessions    Time  8    Period  Weeks    Status  On-going      SLP LONG TERM GOAL #3   Title  Pt will utilize external aids for medication, schedule management with rare min A from family outside of therapy over 5 sessions    Time  8    Period  Weeks    Status  Revised       Plan - 11/15/17 1736    Clinical Impression Statement  Mr. Gellert presents with cognitive impairments seen most acutely today in problem solving, planning, and emergent awareness. Cues required for error awareness and reasoning/problem solving. Wife reports pt report is correct re: medications and use of calendar at this time -goal/s adjusted. See "skilled treatment" for more detail. I recommend cont'd skilled ST to address cognitive communication impairments in order to increase independence, decrease caregiver burden and improve quality of life.     Speech Therapy Frequency  2x / week    Duration  -- 8 weeks or 16 additional visits    Treatment/Interventions  Cognitive reorganization;Compensatory strategies;Language facilitation;Compensatory techniques;Cueing hierarchy;Internal/external aids;Functional tasks;SLP instruction and feedback;Patient/family education;Environmental controls    Potential to Achieve Goals  Good    SLP Home Exercise Plan  memory strategies provided; pt to obtain/use a calendar or daily schedule    Consulted and Agree with Plan of Care  Patient;Family member/caregiver    Family Member Consulted  son and wifeMr. Minner presents with overall mild cognitive impairments. Deficits seen today in higher level attention, short term recall, problem solving, planning, and emergent awareness.  Pt scored within mild range of severity in attention and memory, and moderate range for language. Pt's language score significantly impacted by story recall and generative naming tasks. SLP suspects performance in these tasks is more impacted by pt's impaired attention and recall vs. language. Pt's scores for executive functions and visuospatial skills fell WNL per CLQT scoring for his age, however he had significant difficulty with mazes and design generation task (only 1 correct design). Cues required for error awareness in all tasks, including clock drawing. Pt denies deficits initially, however when confronted with errors he does acknowledge difficulties. Pt's wife and son report pt asking the same question several times, minutes apart. They are supervising him with medications as they realized he was making errors. I recommend skilled ST to address cognitive communication impairments in order to increase independence, decrease caregiver burden and improve quality of life.        Patient will benefit from skilled therapeutic intervention in order to improve the following deficits and impairments:   Cognitive communication deficit    Problem List Patient Active Problem List   Diagnosis Date Noted  . CKD (chronic kidney disease) stage 4, GFR 15-29 ml/min (HCC) 09/26/2017  . 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 Diastolic dysfunction   . Coronary artery disease involving native coronary artery of native heart without angina pectoris   . Cardiac pacemaker in situ   . Atrial fibrillation with rapid ventricular response ([facility])   . PVD (peripheral vascular disease) ([facility])   . Lower extremity edema 04/21/2017  . Anxiety 03/22/2017  . Acute cholecystitis s/p lap cholecystectomy 03/05/2017 03/04/2017  . HOCM (hypertrophic obstructive cardiomyopathy) ([facility])  03/04/2017  . 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 Fatigue  01/27/2017  . Low vitamin B12 level 01/27/2017  . Syncope 07/24/2015  . 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 OSA on CPAP 10/26/2014  . Back pain 10/16/2014  . Seasonal and perennial allergic rhinitis 10/25/2013  . Hypertension associated with diabetes ([facility]) 10/25/2013  . Hyperlipidemia associated with type 2 diabetes mellitus ([facility]) 10/25/2013  . Morbid obesity ([facility]) 10/25/2013    [facility] ,[place], [place]  11/15/2017, 5:41 PM  [facility] [address] [place], [name], [address] Phone: [phone]   Fax:  [phone]   Name: David [name] MRN: [id number] Date of Birth: [birth year]

## 2017-11-17 ENCOUNTER — Ambulatory Visit: Payer: Medicare Other

## 2017-11-18 LAB — CUP PACEART REMOTE DEVICE CHECK
Battery Remaining Longevity: 75 mo
Battery Voltage: 3 V
Brady Statistic AP VP Percent: 0.05 %
Brady Statistic AP VS Percent: 99.66 %
Brady Statistic AS VP Percent: 0 %
Brady Statistic AS VS Percent: 0.29 %
Brady Statistic RA Percent Paced: 99.7 %
Brady Statistic RV Percent Paced: 0.05 %
Date Time Interrogation Session: 20190702174240
Implantable Lead Implant Date: 20091102
Implantable Lead Implant Date: 20091102
Implantable Lead Location: 753859
Implantable Lead Location: 753860
Implantable Lead Model: 5076
Implantable Lead Model: 5076
Implantable Pulse Generator Implant Date: 20170118
Lead Channel Impedance Value: 1064 Ohm
Lead Channel Impedance Value: 437 Ohm
Lead Channel Impedance Value: 456 Ohm
Lead Channel Impedance Value: 589 Ohm
Lead Channel Pacing Threshold Amplitude: 1.875 V
Lead Channel Pacing Threshold Amplitude: 2.375 V
Lead Channel Pacing Threshold Pulse Width: 0.4 ms
Lead Channel Pacing Threshold Pulse Width: 0.4 ms
Lead Channel Sensing Intrinsic Amplitude: 1.125 mV
Lead Channel Sensing Intrinsic Amplitude: 1.125 mV
Lead Channel Sensing Intrinsic Amplitude: 22 mV
Lead Channel Sensing Intrinsic Amplitude: 22 mV
Lead Channel Setting Pacing Amplitude: 2.5 V
Lead Channel Setting Pacing Amplitude: 3.75 V
Lead Channel Setting Pacing Pulse Width: 0.4 ms
Lead Channel Setting Sensing Sensitivity: 2.8 mV

## 2017-11-21 ENCOUNTER — Ambulatory Visit: Payer: Medicare Other | Admitting: Occupational Therapy

## 2017-11-21 ENCOUNTER — Ambulatory Visit: Payer: Medicare Other | Admitting: Physical Therapy

## 2017-11-21 ENCOUNTER — Ambulatory Visit: Payer: Medicare Other

## 2017-11-21 ENCOUNTER — Encounter: Payer: Self-pay | Admitting: Physical Therapy

## 2017-11-21 DIAGNOSIS — R29818 Other symptoms and signs involving the nervous system: Secondary | ICD-10-CM | POA: Diagnosis not present

## 2017-11-21 DIAGNOSIS — R41841 Cognitive communication deficit: Secondary | ICD-10-CM | POA: Diagnosis not present

## 2017-11-21 DIAGNOSIS — M6281 Muscle weakness (generalized): Secondary | ICD-10-CM | POA: Diagnosis not present

## 2017-11-21 DIAGNOSIS — R2689 Other abnormalities of gait and mobility: Secondary | ICD-10-CM

## 2017-11-21 DIAGNOSIS — I69315 Cognitive social or emotional deficit following cerebral infarction: Secondary | ICD-10-CM | POA: Diagnosis not present

## 2017-11-21 NOTE — Therapy (Signed)
[facility] [address] [place], [name], [address] Phone: [phone]   Fax:  [phone]  Physical Therapy Treatment  Patient Details  Name: David [name] MRN: [id number] Date of Birth: 30-Jan-1941 Referring Provider: Dr. Naaman Plummer   Encounter Date: 11/21/2017  PT End of Session - 11/21/17 1456    Visit Number  11    Number of Visits  17    Date for PT Re-Evaluation  12/17/17    Authorization Type  Medicare; [employer]nd [employer]    Authorization Time Period  09/18/17 to 12/17/17    PT Start Time  1450    PT Stop Time  1530    PT Time Calculation (min)  40 min    Equipment Utilized During Treatment  Gait belt    Activity Tolerance  Patient tolerated treatment well low BP 88/48    Behavior During Therapy  WFL for tasks assessed/performed       Past Medical History:  Diagnosis Date  . Anemia, iron deficiency   . Anxiety   . Arthritis   . BPH (benign prostatic hypertrophy)   . CAD (coronary artery disease)    Nonobstructive CAD per cath  . Cardiac pacemaker in situ   . CHF (congestive heart failure) ([facility])   . Chronic ulcer of right foot ([facility])   . CKD (chronic kidney disease), stage III ([facility]) secondary to DM and HTN   nephrologist-  [name]  . Dyspnea   . History of cellulitis    right great toe 10-25-2014  . History of skin cancer   . HOCM (hypertrophic obstructive cardiomyopathy) ([facility])   . Hypertension   . Insulin dependent type 2 diabetes mellitus ([facility]) 1991   followd by [name]--  has insulin pump  . Insulin pump in place   . OSA on CPAP   . Peripheral neuropathy    severe  . Peripheral vascular disease ([facility])    bilateral lower extremities  . Rib fracture 07/24/2015  . Secondary hyperparathyroidism of renal origin ([facility])   . Sinus node dysfunction ([facility])   . Sleep apnea     Past Surgical History:  Procedure Laterality Date  . AMPUTATION OF REPLICATED TOES  Mar 3790   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)   . AMPUTATION TOE Right 03/12/2015   Procedure: RIGHT HALLUS AMPUTATION ;  Surgeon: [name], MD;  Location: [facility];  Service: Podiatry;  Laterality: Right;  . CARDIAC CATHETERIZATION  11-25-2010   [place], [place]   Nonobstructive CAD  . CARDIAC PACEMAKER PLACEMENT  Nov 2009   [employer]  . CHOLECYSTECTOMY N/A 03/05/2017   Procedure: LAPAROSCOPIC CHOLECYSTECTOMY WITH INTRAOPERATIVE CHOLANGIOGRAM;  Surgeon: [name], MD;  [facility];  Service: General;  Laterality: N/A;  . EP IMPLANTABLE DEVICE N/A 06/03/2015   Procedure: PPM Generator Changeout;  Surgeon: [name], MD;  Location: [facility];  Service: Cardiovascular;  Laterality: N/A;  . EXCISION BONE CYST Right 03/06/2015   Procedure: BONE BIOPSIES OF RIGHT FOOT;  Surgeon: [name], MD;  Location: [facility];  Service: Podiatry;  Laterality: Right;  . ORIF ANKLE FRACTURE Left 11/06/2014   Procedure: OPEN REDUCTION INTERNAL FIXATION (ORIF) LEFT  ANKLE FRACTURE;  Surgeon: [name], MD;  Location: [facility];  Service: Orthopedics;  Laterality: Left;  . TEE WITHOUT CARDIOVERSION N/A 09/01/2017   Procedure: TRANSESOPHAGEAL ECHOCARDIOGRAM (TEE);  Surgeon: [name], MD;  Location: [facility];  Service: Cardiovascular;  Laterality:  N/A;  . TOTAL KNEE ARTHROPLASTY    . VEIN LIGATION AND STRIPPING      There were no vitals filed for this visit.  Subjective Assessment - 11/21/17 1456    Subjective  No falls to report.     Patient is accompained by:  Family member    Pertinent History  HTN, morbid obesity, CKD, cardiomyopathy, PPM, macular degeneration, CAD, cardiac arrest in hospital, DM, diabetic neuropathy; PVD, Rt 1-2 toe amputations, partial rt 3rd toe amputation, lt ankle ORIF 6/16    Limitations  Walking    How long can you walk comfortably?  about 10 minutes    Patient Stated Goals  get back to walking without a cane; feeding the birds in his back yard  (filling feeders)    Currently in Pain?  No/denies    Multiple Pain Sites  No       Balance: Blue mat on incline stepping forward and back over seam 10x bil uphill forward, 10x bil downhill forward, 10x bil downhill backward needed cueing to maintain sequencing for proper exercise, min assist due to LOB and need of SPTA to regain balance Step Back on Level Surface Parallel Bars: min guard, hands on bars, VCs to maintain proper sequencing and TCs to step with correct leg, 10x bil Rockerboard: ant/post, min guard due to slight posterior leaning toward SPTA, no hands on parallel bars, 1 min hold; pushing board ant/post with ankle motions, min assist due to increased leaning on SPTA before correction of balance, 1 min     PT Short Term Goals - 11/13/17 1550      PT SHORT TERM GOAL #1   Title  Patient will complete HEP with supervision of family to assure proper technique. (Target for all STGs 11/01/2017--date reflects 4 weeks after 1st treatment 10/03/17)    Time  4    Period  Weeks    Status  Achieved      PT SHORT TERM GOAL #2   Title  Patient will improve TUG normal to <13.5 seconds with SPC to demonstrate lesser fall risk.     Baseline  11/13/2017 Goal met today 9.59 sec with SPC.    Time  4    Period  Weeks    Status  Achieved      PT SHORT TERM GOAL #3   Title  Patient will improve Berg score >=44/56 demonstrating adequate balance for use of cane indoors.     Baseline  11/13/2017 Met today. 49/56    Time  4    Period  Weeks    Status  Achieved      PT SHORT TERM GOAL #4   Title  Patient can verbalize signs/symptoms of CVA, appropriate actions to take, and understanding of need to get to hospital within 3 hours of onset of symptoms    Baseline  11/13/2017 Met today.    Time  4    Period  Weeks    Status  Achieved        PT Long Term Goals - 11/15/17 1537      PT LONG TERM GOAL #1   Title  Patient will be independent with HEP (using handouts) for balance and strengthening.  (Target for all LTG's 12/02/2017--based on late start date)    Time  8    Period  Weeks    Status  On-going      PT LONG TERM GOAL #2   Title  Patient will improve gait velocity to >=2.62  ft/sec with least restrictive assistive device (indicative of velocity for safe community ambulation)    Time  8    Period  Weeks    Status  On-going      PT LONG TERM GOAL #3   Title  Patient will improve Berg to >=47/56 indicative of safe use of cane outdoors.     Baseline  11/13/2017 Met today 49/56.    Time  8    Period  Weeks    Status  Achieved      PT LONG TERM GOAL #4   Title  Patient able to ambulate >= 200 ft outdoors with cane modified independent over unlevel ground to allow safe walking to his bird feeders in his back yard.     Baseline  11/15/2017 Met on 10/30/2017 in note by Barry Brunner.    Time  8    Period  Weeks    Status  Achieved        Plan - 11/21/17 1540    Clinical Impression Statement  Today's session focused on balance and regaining balance when backpedaling. Pt. was informed that he was only scheduled through this week and was asked if he felt that he needed to continue with PT. Pt. indicated that he is ok to discharge next visit.    Rehab Potential  Good    Clinical Impairments Affecting Rehab Potential  - Multiple co-morbidities; + very motivated    PT Frequency  2x / week    PT Duration  8 weeks    PT Treatment/Interventions  ADLs/Self Care Home Management;Aquatic Therapy;Electrical Stimulation;Gait training;DME Instruction;Stair training;Functional mobility training;Therapeutic activities;Therapeutic exercise;Balance training;Neuromuscular re-education;Cognitive remediation;Manual techniques;Orthotic Fit/Training;Patient/family education;Passive range of motion    PT Next Visit Plan  Check goals for anticipated discharge.     PT Home Exercise Plan  reports no HEP given on CIR, wife confirms    Consulted and Agree with Plan of Care  Patient;Family member/caregiver     Family Member Consulted  wife       Patient will benefit from skilled therapeutic intervention in order to improve the following deficits and impairments:  Abnormal gait, Decreased activity tolerance, Decreased balance, Decreased cognition, Decreased mobility, Decreased knowledge of use of DME, Decreased strength, Increased edema, Impaired sensation, Postural dysfunction, Impaired UE functional use, Obesity  Visit Diagnosis: Muscle weakness (generalized)  Other symptoms and signs involving the nervous system  Other abnormalities of gait and mobility     Problem List Patient Active Problem List   Diagnosis Date Noted  . CKD (chronic kidney disease) stage 4, GFR 15-29 ml/min (HCC) 09/26/2017  . 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 Diastolic dysfunction   . Coronary artery disease involving native coronary artery of native heart without angina pectoris   . Cardiac pacemaker in situ   . Atrial fibrillation with rapid ventricular response ([facility] [facility])   . PVD (peripheral vascular disease) ([facility])   . Lower extremity edema 04/21/2017  . Anxiety 03/22/2017  . Acute cholecystitis s/p lap cholecystectomy 03/05/2017 03/04/2017  . HOCM (hypertrophic obstructive cardiomyopathy) ([facility]) 03/04/2017  . 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 Fatigue 01/27/2017  . Low vitamin B12 level 01/27/2017  . Syncope 07/24/2015  . 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 OSA on CPAP 10/26/2014  . Back pain 10/16/2014  . Seasonal and perennial allergic rhinitis 10/25/2013  . Hypertension associated with diabetes ([facility]) 10/25/2013  . Hyperlipidemia associated with  type 2 diabetes mellitus ([facility]) 10/25/2013  . Morbid obesity ([facility]) 10/25/2013    Arthor Captain, SPTA 11/21/2017, 3:55 PM  [facility] [address] [place], [name], [address] Phone:  [phone]   Fax:  [phone]  Name: David [name] MRN: [id number] Date of Birth: 12/12/1940

## 2017-11-21 NOTE — Therapy (Signed)
[facility] [address] [place], [name], [address] Phone: [phone]   Fax:  [phone]  Speech Language Pathology Treatment  Patient Details  Name: David [name] MRN: [id number] Date of Birth: [birth year] Referring Provider: Dr. Naaman Plummer   Encounter Date: 11/21/2017  End of Session - 11/21/17 1631    Visit Number  3    Number of Visits  17    Date for SLP Re-Evaluation  12/22/17    SLP Start Time  1601    SLP Stop Time   0932    SLP Time Calculation (min)  45 min    Activity Tolerance  Patient tolerated treatment well       Past Medical History:  Diagnosis Date  . Acute cholecystitis s/p lap cholecystectomy 03/05/2017 03/04/2017  . Anemia, iron deficiency   . Anxiety   . Arthritis   . Atrial fibrillation with rapid ventricular response ([place])   . BPH (benign prostatic hypertrophy)   . CAD (coronary artery disease)    Nonobstructive CAD per cath  . Cardiac pacemaker in situ   . CHF (congestive heart failure) ([place])   . Chronic ulcer of right foot ([place])   . CKD (chronic kidney disease) stage 4, GFR 15-29 ml/min (HCC) 09/26/2017  . CKD (chronic kidney disease), stage III ([place]) secondary to DM and HTN   nephrologist-  [name]  . Coronary artery disease involving native coronary artery of native heart without angina pectoris   . Diastolic dysfunction   . Dyspnea   . 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 History of cellulitis    right great toe 10-25-2014  . History of skin cancer   . HOCM (hypertrophic obstructive cardiomyopathy) ([place])   . Hyperlipidemia associated with type 2 diabetes mellitus ([place]) 10/25/2013  . Hypertension   . Hypertension associated with diabetes ([place]) 10/25/2013  . 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 Insulin dependent type 2 diabetes mellitus ([place]) 1991   followd by [name]--  has insulin pump  .  Insulin pump in place   . OSA on CPAP   . 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 Peripheral neuropathy    severe  . Peripheral vascular disease ([place])    bilateral lower extremities  . PVD (peripheral vascular disease) ([place])   . Rib fracture 07/24/2015  . Seasonal and perennial allergic rhinitis 10/25/2013  . Secondary hyperparathyroidism of renal origin ([place])   . 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 Sinus node dysfunction (HCC)   . Sleep apnea   . Syncope 07/24/2015    Past Surgical History:  Procedure Laterality Date  . AMPUTATION OF REPLICATED TOES  Mar 3557   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)  . AMPUTATION TOE Right 03/12/2015   Procedure: RIGHT HALLUS AMPUTATION ;  Surgeon: [name], MD;  Location: [facility];  Service: Podiatry;  Laterality: Right;  . CARDIAC CATHETERIZATION  11-25-2010   [place], [place]   Nonobstructive CAD  . CARDIAC PACEMAKER PLACEMENT  Nov 2009   [employer]  . CHOLECYSTECTOMY N/A 03/05/2017   Procedure: LAPAROSCOPIC CHOLECYSTECTOMY WITH INTRAOPERATIVE CHOLANGIOGRAM;  Surgeon: [name], MD;  Location: [facility];  Service: General;  Laterality: N/A;  . EP IMPLANTABLE DEVICE N/A 06/03/2015   Procedure: PPM Generator Changeout;  Surgeon: [name], MD;  Location: [facility];  Service: Cardiovascular;  Laterality: N/A;  . EXCISION BONE CYST Right 03/06/2015   Procedure: BONE BIOPSIES  OF RIGHT FOOT;  Surgeon: [name], MD;  Location: [facility];  Service: Podiatry;  Laterality: Right;  . ORIF ANKLE FRACTURE Left 11/06/2014   Procedure: OPEN REDUCTION INTERNAL FIXATION (ORIF) LEFT  ANKLE FRACTURE;  Surgeon: [name], MD;  Location: [facility];  Service: Orthopedics;  Laterality: Left;  . TEE WITHOUT CARDIOVERSION N/A 09/01/2017   Procedure: TRANSESOPHAGEAL ECHOCARDIOGRAM (TEE);  Surgeon: [name], MD;  Location: [facility];  Service: Cardiovascular;  Laterality: N/A;  . TOTAL KNEE ARTHROPLASTY    . VEIN  LIGATION AND STRIPPING      There were no vitals filed for this visit.  Subjective Assessment - 11/21/17 1541    Subjective  "I did those two meaning words and the one I got hung up with was "mean."'    Currently in Pain?  No/denies            ADULT SLP TREATMENT - 11/21/17 1542      General Information   Behavior/Cognition  Alert;Cooperative;Pleasant mood      Treatment Provided   Treatment provided  Cognitive-Linquistic      Cognitive-Linquistic Treatment   Treatment focused on  Cognition    Skilled Treatment  SLP worked again with pt on simple deductive reasoning puzzles; pt with occasional min-mod A and reminders to cross out unecessary information. Average time to complete a puzzle was 7.5 minutes. Pt was then provided auditory word problems (simple - time crossing :00 barrier) with max A and multiple identical responses that pt did not demo awareness for, average time 6.5 minutes. Pt stated, "Why the ** is this so hard for me?" SLP reiterated that this was result of CVA and educated re: neuroplasticity. Pt began to demo intellectual awarness of the extent of his cognitive linguistic deficits.       Assessment / Recommendations / Plan   Plan  Continue with current plan of care      Progression Toward Goals   Progression toward goals  Progressing toward goals       SLP Education - 11/21/17 1630    Education provided  Yes    Education Details  deficit areas, neuroplasticity, need to do homework    Person(s) Educated  Patient    Methods  Explanation;Demonstration    Comprehension  Verbalized understanding;Need further instruction       SLP Short Term Goals - 11/21/17 1632      SLP SHORT TERM GOAL #1   Title  Pt will selectively attend to mod complex cognitive linguistic task for 10 minutes with distractions and occasional min redirection over 3 sessions    Time  3    Period  Weeks    Status  On-going      SLP SHORT TERM GOAL #2   Title  Pt will complete mod  complex organization, reasoning, problem solving tasks with 85% accuracy and occasional min A over 3 sessions    Time  3    Period  Weeks    Status  On-going      SLP SHORT TERM GOAL #3   Title  Pt will ID errors and self correct 3/5 errors with occasional min A over 3 sessions    Time  3    Period  Weeks    Status  On-going      SLP SHORT TERM GOAL #4   Title  Pt will utilize external aids for medication, schedule management with occasional min A from family outside of therapy  over 3 sessions    Baseline  (reported) 11-15-17, (reported) 11-21-17    Time  3    Period  Weeks    Status  On-going       SLP Long Term Goals - 11/21/17 1633      SLP LONG TERM GOAL #1   Title  Pt will alternate attention between 2 mod complex cognitive linguistic tasks for 15 minutes with 85% on each and rare min A over 3 sessions    Time  7    Period  Weeks    Status  On-going      SLP LONG TERM GOAL #2   Title  Pt will self correct errors 4/5 errors with occasional min A over 3 sessions    Time  7    Period  Weeks    Status  On-going      SLP LONG TERM GOAL #3   Title  Pt will utilize external aids for medication, schedule management with rare min A from family outside of therapy over 5 sessions    Time  7    Period  Weeks    Status  Revised       Plan - 11/21/17 1631    Clinical Impression Statement  David [name] presents with cognitive impairments seen most acutely today in problem solving, planning, and intellectual and emergent awareness. Pt began to demo some intellectual awareness to cog-linguistic deficits. Cues again required for problem solving and error awareness. See "skilled treatment" for more detail. I recommend cont'd skilled ST to address cognitive communication impairments in order to increase independence, decrease caregiver burden and improve quality of life.     Speech Therapy Frequency  2x / week    Duration  -- 8 weeks or 16 additional visits    Treatment/Interventions   Cognitive reorganization;Compensatory strategies;Language facilitation;Compensatory techniques;Cueing hierarchy;Internal/external aids;Functional tasks;SLP instruction and feedback;Patient/family education;Environmental controls    Potential to Achieve Goals  Good    SLP Home Exercise Plan  memory strategies provided; pt to obtain/use a calendar or daily schedule    Consulted and Agree with Plan of Care  Patient;Family member/caregiver    Family Member Consulted  son and wifeMr. [name] presents with overall mild cognitive impairments. Deficits seen today in higher level attention, short term recall, problem solving, planning, and emergent awareness. Pt scored within mild range of severity in attention and memory, and moderate range for language. Pt's language score significantly impacted by story recall and generative naming tasks. SLP suspects performance in these tasks is more impacted by pt's impaired attention and recall vs. language. Pt's scores for executive functions and visuospatial skills fell WNL per CLQT scoring for his age, however he had significant difficulty with mazes and design generation task (only 1 correct design). Cues required for error awareness in all tasks, including clock drawing. Pt denies deficits initially, however when confronted with errors he does acknowledge difficulties. Pt's wife and son report pt asking the same question several times, minutes apart. They are supervising him with medications as they realized he was making errors. I recommend skilled ST to address cognitive communication impairments in order to increase independence, decrease caregiver burden and improve quality of life.        Patient will benefit from skilled therapeutic intervention in order to improve the following deficits and impairments:   Cognitive communication deficit    Problem List Patient Active Problem List   Diagnosis Date Noted  . CKD (chronic kidney disease) stage 4, GFR 15-29  ml/min ([place]) 09/26/2017  . 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 Diastolic dysfunction   . Coronary artery disease involving native coronary artery of native heart without angina pectoris   . Cardiac pacemaker in situ   . Atrial fibrillation with rapid ventricular response ([place])   . PVD (peripheral vascular disease) ([place])   . Lower extremity edema 04/21/2017  . Anxiety 03/22/2017  . Acute cholecystitis s/p lap cholecystectomy 03/05/2017 03/04/2017  . HOCM (hypertrophic obstructive cardiomyopathy) ([place]) 03/04/2017  . 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 Fatigue 01/27/2017  . Low vitamin B12 level 01/27/2017  . Syncope 07/24/2015  . 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 OSA on CPAP 10/26/2014  . Back pain 10/16/2014  . Seasonal and perennial allergic rhinitis 10/25/2013  . Hypertension associated with diabetes ([place]) 10/25/2013  . Hyperlipidemia associated with type 2 diabetes mellitus ([place]) 10/25/2013  . Morbid obesity ([place]) 10/25/2013    [place] ,[place], [place]  11/21/2017, 4:34 PM  [facility] [address] [place], [name], [address] Phone: [phone]   Fax:  [phone]   Name: SARAH ZERBY MRN: [id number] Date of Birth: Dec 14, 1940

## 2017-11-21 NOTE — Telephone Encounter (Signed)
Left message on machine to call me regarding blood sugar readings and changing his cartridge and infusion sets.

## 2017-11-21 NOTE — Patient Instructions (Signed)
  Please complete the assigned speech therapy homework prior to your next session and return it to the speech therapist at your next visit.  

## 2017-11-22 NOTE — Therapy (Signed)
[facility] [address] [place], [name], [address] Phone: [phone]   Fax:  [phone]  Occupational Therapy Treatment  Patient Details  Name: David [name] MRN: [id number] Date of Birth: 04-Mar-1941 Referring Provider: Dr. Naaman Plummer   Encounter Date: 11/21/2017  OT End of Session - 11/21/17 1405    Visit Number  11    Number of Visits  17    Date for OT Re-Evaluation  11/18/17    Authorization Type  Medicare/ [employer]    OT Start Time  1405    OT Stop Time  1445    OT Time Calculation (min)  40 min    Activity Tolerance  Patient tolerated treatment well    Behavior During Therapy  [facility] for tasks assessed/performed       Past Medical History:  Diagnosis Date  . Acute cholecystitis s/p lap cholecystectomy 03/05/2017 03/04/2017  . Anemia, iron deficiency   . Anxiety   . Arthritis   . Atrial fibrillation with rapid ventricular response ([place])   . BPH (benign prostatic hypertrophy)   . CAD (coronary artery disease)    Nonobstructive CAD per cath  . Cardiac pacemaker in situ   . CHF (congestive heart failure) ([place])   . Chronic ulcer of right foot ([place])   . CKD (chronic kidney disease) stage 4, GFR 15-29 ml/min (HCC) 09/26/2017  . CKD (chronic kidney disease), stage III ([place]) secondary to DM and HTN   nephrologist-  [name]  . Coronary artery disease involving native coronary artery of native heart without angina pectoris   . Diastolic dysfunction   . Dyspnea   . 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 History of cellulitis    right great toe 10-25-2014  . History of skin cancer   . HOCM (hypertrophic obstructive cardiomyopathy) ([place])   . Hyperlipidemia associated with type 2 diabetes mellitus ([place]) 10/25/2013  . Hypertension   . Hypertension associated with diabetes ([place]) 10/25/2013  . IDDM (insulin dependent diabetes mellitus) - on insulin pump 03/04/2017   . Insulin dependent type 2 diabetes mellitus ([place]) 1991   followd by [name]--  has insulin pump  . Insulin pump in place   . OSA on CPAP   . 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 Peripheral neuropathy    severe  . Peripheral vascular disease ([place])    bilateral lower extremities  . PVD (peripheral vascular disease) ([place])   . Rib fracture 07/24/2015  . Seasonal and perennial allergic rhinitis 10/25/2013  . Secondary hyperparathyroidism of renal origin ([place])   . 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 Sinus node dysfunction (HCC)   . Sleep apnea   . Syncope 07/24/2015    Past Surgical History:  Procedure Laterality Date  . AMPUTATION OF REPLICATED TOES  Mar 7124   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)  . AMPUTATION TOE Right 03/12/2015   Procedure: RIGHT HALLUS AMPUTATION ;  Surgeon: [name], MD;  Location: [facility];  Service: Podiatry;  Laterality: Right;  . CARDIAC CATHETERIZATION  11-25-2010   [place], [place]   Nonobstructive CAD  . CARDIAC PACEMAKER PLACEMENT  Nov 2009   [employer]  . CHOLECYSTECTOMY N/A 03/05/2017   Procedure: LAPAROSCOPIC CHOLECYSTECTOMY WITH INTRAOPERATIVE CHOLANGIOGRAM;  Surgeon: [name], MD;  [facility];  Service: General;  Laterality: N/A;  . EP IMPLANTABLE DEVICE N/A 06/03/2015   Procedure: PPM Generator Changeout;  Surgeon: [name], MD;  Location: [facility];  Service: Cardiovascular;  Laterality: N/A;  . EXCISION BONE CYST Right 03/06/2015   Procedure: BONE BIOPSIES OF RIGHT FOOT;  Surgeon: [name], MD;  Location: [facility];  Service: Podiatry;  Laterality: Right;  . ORIF ANKLE FRACTURE Left 11/06/2014   Procedure: OPEN REDUCTION INTERNAL FIXATION (ORIF) LEFT  ANKLE FRACTURE;  Surgeon: [name], MD;  Location: [facility];  Service: Orthopedics;  Laterality: Left;  . TEE WITHOUT CARDIOVERSION N/A 09/01/2017   Procedure: TRANSESOPHAGEAL ECHOCARDIOGRAM (TEE);  Surgeon: [name], MD;  Location:  [facility];  Service: Cardiovascular;  Laterality: N/A;  . TOTAL KNEE ARTHROPLASTY    . VEIN LIGATION AND STRIPPING      There were no vitals filed for this visit.  Subjective Assessment - 11/21/17 1405    Subjective   Denies pain    Patient Stated Goals  to get back to prior functional level    Currently in Pain?  No/denies                Treatment: Navigating a busy environment to locate items , pt missed 1/15 on first pass, grossly 93%  Accuracy and no LOB. Ambulating while perfoming category generation, min-mod v.c Tabletop activities for alternating attention, memory, min -mod difficulty, min v.c.             OT Short Term Goals - 10/25/17 1222      OT SHORT TERM GOAL #1   Title  I with HEP for UE strength    Status  Achieved      OT SHORT TERM GOAL #2   Title  Pt will verbalize understanding of compensatory strategies for short term memory deficits.    Status  Achieved      OT SHORT TERM GOAL #3   Title  Pt will perform basic home management/ cooking with supervision and no LOB demonstrating good safety awareness.    Time  4    Period  Weeks    Status  Achieved      OT SHORT TERM GOAL #4   Title  Pt demonstrate selective attention to a functional task  in a busy environment x 20 without redirection.    Status  Achieved      OT SHORT TERM GOAL #5   Title  Further assess cogntion and set additional goals prn    Status  Deferred        OT Long Term Goals - 11/15/17 1420      OT LONG TERM GOAL #1   Title  Pt will perform home managment and cooking activities modified independently demonstrating good safety awareness and no LOB.    Status  On-going      OT LONG TERM GOAL #2   Title  Pt will demonstrate ability to perfrom a physical and cogntive task simultaneously with 90% or better accuracy.    Status  On-going      OT LONG TERM GOAL #3   Title  Pt will perform basic money exchange for functional setting with 90% or greater accuracy     Status  Deferred Pt's wife handles finances            Plan - 11/22/17 1257    Clinical Impression Statement  Pt is progressing towards goals. Pt and wife agree with plans for d/c next visit..    Occupational Profile and client history currently impacting functional performance  Prior to CVA pt was modified independent with all basic  ADLS, IADLS and ambulating with a cane, he enjoys feeding the birds.PMH:CVA, history of type 2 diabetes mellitus with neuropathy, CAD, cardiac arrest in hospital, R toe amputations, CKD stage III, age-related macular degeneration    Occupational performance deficits (Please refer to evaluation for details):  ADL's;IADL's;Social Participation    Rehab Potential  Good    Current Impairments/barriers affecting progress:  cogntive deficits, toe amputations, neuropathy affecting mobility    OT Frequency  2x / week    OT Duration  8 weeks    OT Treatment/Interventions  Self-care/ADL training;Therapeutic exercise;Visual/perceptual remediation/compensation;Patient/family education;Neuromuscular education;Therapeutic activities;Functional Mobility Training;Energy conservation;Cryotherapy;Ultrasound;Passive range of motion;Cognitive remediation/compensation;Manual Therapy;DME and/or AE instruction;Fluidtherapy;Moist Heat;Paraffin    Plan  continue to address divided / altenating attention in a functional context, as well as balance/ endurance for home managment. anticipate d/c next  visit    Consulted and Agree with Plan of Care  Patient;Family member/caregiver       Patient will benefit from skilled therapeutic intervention in order to improve the following deficits and impairments:  Abnormal gait, Decreased cognition, Decreased knowledge of use of DME, Impaired flexibility, Impaired vision/preception, Decreased mobility, Decreased activity tolerance, Decreased endurance, Impaired UE functional use, Difficulty walking, Decreased safety awareness, Decreased knowledge of  precautions, Decreased balance  Visit Diagnosis: Muscle weakness (generalized)  Other symptoms and signs involving the nervous system  Cognitive social or emotional deficit following cerebral infarction    Problem List Patient Active Problem List   Diagnosis Date Noted  . CKD (chronic kidney disease) stage 4, GFR 15-29 ml/min (HCC) 09/26/2017  . 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 Diastolic dysfunction   . Coronary artery disease involving native coronary artery of native heart without angina pectoris   . Cardiac pacemaker in situ   . Atrial fibrillation with rapid ventricular response ([place])   . PVD (peripheral vascular disease) ([place])   . Lower extremity edema 04/21/2017  . Anxiety 03/22/2017  . Acute cholecystitis s/p lap cholecystectomy 03/05/2017 03/04/2017  . HOCM (hypertrophic obstructive cardiomyopathy) ([place]) 03/04/2017  . 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 Fatigue 01/27/2017  . Low vitamin B12 level 01/27/2017  . Syncope 07/24/2015  . 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 OSA on CPAP 10/26/2014  . Back pain 10/16/2014  . Seasonal and perennial allergic rhinitis 10/25/2013  . Hypertension associated with diabetes ([place]) 10/25/2013  . Hyperlipidemia associated with type 2 diabetes mellitus ([place]) 10/25/2013  . Morbid obesity ([place]) 10/25/2013    RINE,KATHRYN 11/22/2017, 1:03 PM  [facility] [address] [place], [name], [address] Phone: [phone]   Fax:  [phone]  Name: David [name] MRN: [id number] Date of Birth: 1941-02-04

## 2017-11-23 ENCOUNTER — Ambulatory Visit: Payer: Medicare Other | Admitting: Occupational Therapy

## 2017-11-23 ENCOUNTER — Ambulatory Visit: Payer: Medicare Other | Admitting: Physical Therapy

## 2017-11-23 ENCOUNTER — Encounter: Payer: Self-pay | Admitting: Physical Therapy

## 2017-11-23 ENCOUNTER — Ambulatory Visit: Payer: Medicare Other | Admitting: Speech Pathology

## 2017-11-23 DIAGNOSIS — R2689 Other abnormalities of gait and mobility: Secondary | ICD-10-CM

## 2017-11-23 DIAGNOSIS — R41841 Cognitive communication deficit: Secondary | ICD-10-CM

## 2017-11-23 DIAGNOSIS — I69315 Cognitive social or emotional deficit following cerebral infarction: Secondary | ICD-10-CM | POA: Diagnosis not present

## 2017-11-23 DIAGNOSIS — R29818 Other symptoms and signs involving the nervous system: Secondary | ICD-10-CM | POA: Diagnosis not present

## 2017-11-23 DIAGNOSIS — M6281 Muscle weakness (generalized): Secondary | ICD-10-CM

## 2017-11-23 NOTE — Therapy (Signed)
[facility] [address] [place], [name], [address] Phone: [phone]   Fax:  [phone]  Speech Language Pathology Treatment  Patient Details  Name: David [name] MRN: [id number] Date of Birth: 1941/04/15 Referring Provider: Dr. Naaman Plummer   Encounter Date: 11/23/2017  End of Session - 11/23/17 1712    Visit Number  4    Number of Visits  17    Date for SLP Re-Evaluation  12/22/17    SLP Start Time  1533    SLP Stop Time   1614    SLP Time Calculation (min)  41 min    Activity Tolerance  Patient tolerated treatment well       Past Medical History:  Diagnosis Date  . Acute cholecystitis s/p lap cholecystectomy 03/05/2017 03/04/2017  . Anemia, iron deficiency   . Anxiety   . Arthritis   . Atrial fibrillation with rapid ventricular response ([facility])   . BPH (benign prostatic hypertrophy)   . CAD (coronary artery disease)    Nonobstructive CAD per cath  . Cardiac pacemaker in situ   . CHF (congestive heart failure) ([place])   . Chronic ulcer of right foot ([place])   . CKD (chronic kidney disease) stage 4, GFR 15-29 ml/min (HCC) 09/26/2017  . CKD (chronic kidney disease), stage III ([place]) secondary to DM and HTN   nephrologist-  [name]  . Coronary artery disease involving native coronary artery of native heart without angina pectoris   . Diastolic dysfunction   . Dyspnea   . 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 History of cellulitis    right great toe 10-25-2014  . History of skin cancer   . HOCM (hypertrophic obstructive cardiomyopathy) ([facility])   . Hyperlipidemia associated with type 2 diabetes mellitus ([place]) 10/25/2013  . Hypertension   . Hypertension associated with diabetes ([place]) 10/25/2013  . 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 Insulin dependent type 2 diabetes mellitus ([place]) 1991   followd by [name]--  has insulin pump  .  Insulin pump in place   . OSA on CPAP   . 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 Peripheral neuropathy    severe  . Peripheral vascular disease ([place])    bilateral lower extremities  . PVD (peripheral vascular disease) ([facility])   . Rib fracture 07/24/2015  . Seasonal and perennial allergic rhinitis 10/25/2013  . Secondary hyperparathyroidism of renal origin ([place])   . 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 Sinus node dysfunction (HCC)   . Sleep apnea   . Syncope 07/24/2015    Past Surgical History:  Procedure Laterality Date  . AMPUTATION OF REPLICATED TOES  Mar 2202   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)  . AMPUTATION TOE Right 03/12/2015   Procedure: RIGHT HALLUS AMPUTATION ;  Surgeon: [name], MD;  Location: [facility];  Service: Podiatry;  Laterality: Right;  . CARDIAC CATHETERIZATION  11-25-2010   [place], [place]   Nonobstructive CAD  . CARDIAC PACEMAKER PLACEMENT  Nov 2009   [employer]  . CHOLECYSTECTOMY N/A 03/05/2017   Procedure: LAPAROSCOPIC CHOLECYSTECTOMY WITH INTRAOPERATIVE CHOLANGIOGRAM;  Surgeon: [name], MD;  [facility];  Service: General;  Laterality: N/A;  . EP IMPLANTABLE DEVICE N/A 06/03/2015   Procedure: PPM Generator Changeout;  Surgeon: [name], MD;  Location: [facility];  Service: Cardiovascular;  Laterality: N/A;  . EXCISION BONE CYST Right 03/06/2015   Procedure: BONE BIOPSIES  OF RIGHT FOOT;  Surgeon: [name], MD;  Location: [facility];  Service: Podiatry;  Laterality: Right;  . ORIF ANKLE FRACTURE Left 11/06/2014   Procedure: OPEN REDUCTION INTERNAL FIXATION (ORIF) LEFT  ANKLE FRACTURE;  Surgeon: [name], MD;  Location: [facility];  Service: Orthopedics;  Laterality: Left;  . TEE WITHOUT CARDIOVERSION N/A 09/01/2017   Procedure: TRANSESOPHAGEAL ECHOCARDIOGRAM (TEE);  Surgeon: [name], MD;  Location: [facility];  Service: Cardiovascular;  Laterality: N/A;  . TOTAL KNEE ARTHROPLASTY    . VEIN  LIGATION AND STRIPPING      There were no vitals filed for this visit.  Subjective Assessment - 11/23/17 1536    Subjective  "I realized I have to do a little more brain work."    Currently in Pain?  No/denies            ADULT SLP TREATMENT - 11/23/17 1533      General Information   Behavior/Cognition  Alert;Cooperative;Pleasant mood      Treatment Provided   Treatment provided  Cognitive-Linquistic      Pain Assessment   Pain Assessment  No/denies pain      Cognitive-Linquistic Treatment   Treatment focused on  Cognition    Skilled Treatment  Pt told SLP he was surprised by difficult with time calculations during previous task. Reports he has been working on cognitive activities at home daily; did not bring with him to session. Pt with extended time required for calculating times (early/late appointment arrival), with increasing difficulty with times outside of quarter hour intervals. Mod A required and pt benefitted from using visual aids, drawings to assist with problem solving. Pt cont'd to demo improving intellectual awareness this session, stating, "It's just not clicking" and acknowledging difficulties with problem solving and slow processing.       Assessment / Recommendations / Plan   Plan  Continue with current plan of care      Progression Toward Goals   Progression toward goals  Progressing toward goals         SLP Short Term Goals - 11/23/17 1710      SLP SHORT TERM GOAL #1   Title  Pt will selectively attend to mod complex cognitive linguistic task for 10 minutes with distractions and occasional min redirection over 3 sessions    Time  3    Period  Weeks    Status  On-going      SLP SHORT TERM GOAL #2   Title  Pt will complete mod complex organization, reasoning, problem solving tasks with 85% accuracy and occasional min A over 3 sessions    Time  3    Period  Weeks    Status  On-going      SLP SHORT TERM GOAL #3   Title  Pt will ID errors and  self correct 3/5 errors with occasional min A over 3 sessions    Time  3    Period  Weeks    Status  On-going      SLP SHORT TERM GOAL #4   Title  Pt will utilize external aids for medication, schedule management with occasional min A from family outside of therapy over 3 sessions    Time  3    Period  Weeks    Status  On-going       SLP Long Term Goals - 11/23/17 1711      SLP LONG TERM GOAL #1   Title  Pt  will alternate attention between 2 mod complex cognitive linguistic tasks for 15 minutes with 85% on each and rare min A over 3 sessions    Time  7    Period  Weeks    Status  On-going      SLP LONG TERM GOAL #2   Title  Pt will self correct errors 4/5 errors with occasional min A over 3 sessions    Time  7    Period  Weeks    Status  On-going      SLP LONG TERM GOAL #3   Title  Pt will utilize external aids for medication, schedule management with rare min A from family outside of therapy over 5 sessions    Time  7    Period  Weeks    Status  On-going       Plan - 11/23/17 1712    Clinical Impression Statement  Mr. Gandolfo presents with cognitive impairments seen most acutely today in problem solving, planning, and intellectual and emergent awareness. Pt began to demo some intellectual awareness to cog-linguistic deficits. Cues again required for problem solving and error awareness. See "skilled treatment" for more detail. I recommend cont'd skilled ST to address cognitive communication impairments in order to increase independence, decrease caregiver burden and improve quality of life.     Speech Therapy Frequency  2x / week    Treatment/Interventions  Cognitive reorganization;Compensatory strategies;Language facilitation;Compensatory techniques;Cueing hierarchy;Internal/external aids;Functional tasks;SLP instruction and feedback;Patient/family education;Environmental controls    Potential to Achieve Goals  Good    Consulted and Agree with Plan of Care  Patient;Family  member/caregiver       Patient will benefit from skilled therapeutic intervention in order to improve the following deficits and impairments:   Cognitive communication deficit    Problem List Patient Active Problem List   Diagnosis Date Noted  . CKD (chronic kidney disease) stage 4, GFR 15-29 ml/min (HCC) 09/26/2017  . 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 Diastolic dysfunction   . Coronary artery disease involving native coronary artery of native heart without angina pectoris   . Cardiac pacemaker in situ   . Atrial fibrillation with rapid ventricular response ([place] [facility])   . PVD (peripheral vascular disease) ([facility])   . Lower extremity edema 04/21/2017  . Anxiety 03/22/2017  . Acute cholecystitis s/p lap cholecystectomy 03/05/2017 03/04/2017  . HOCM (hypertrophic obstructive cardiomyopathy) ([facility]) 03/04/2017  . 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 Fatigue 01/27/2017  . Low vitamin B12 level 01/27/2017  . Syncope 07/24/2015  . 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 OSA on CPAP 10/26/2014  . Back pain 10/16/2014  . Seasonal and perennial allergic rhinitis 10/25/2013  . Hypertension associated with diabetes ([place]) 10/25/2013  . Hyperlipidemia associated with type 2 diabetes mellitus ([place]) 10/25/2013  . Morbid obesity ([facility]) 10/25/2013   Deneise Lever, [place], [place] 11/23/2017, 5:13 PM  [facility] [address] [place], [name], [address] Phone: [phone]   Fax:  [phone]   Name: DEREN DEGRAZIA MRN: [id number] Date of Birth: 16-Aug-1940

## 2017-11-23 NOTE — Patient Instructions (Addendum)
I recommend you complete speech therapy before return to driving in order to allow your thinking skills to improve particularly alternating and divided attention . If you still feel uncertain about driving or continue to experience difficulties with attention.  I would recommend the following driving eval programs. They can further assess your safety for driving but they are not covered by insurance. They will look at your thinking and visual skills and ability to react quickly.  If you resume driving on your own, you should progress slowly and gradually, starting out only driving with someone on familiar roads. You should only progress to driving alone if you have been safe driving with someone for a while.    Local Driver Evaluation Programs:  Comprehensive Evaluation: includes clinical and in vehicle behind the wheel testing by OCCUPATIONAL THERAPIST. Programs have varying levels of adaptive controls available for trial.   [employer], [place] [address] [place], [place]  [address] [phone] or [phone] [email or web address].[email or web address]-rehab.com Evaluator:  Richelle Ito, OT/CDRS/CDI/SCDCM/[facility] [address] [place], [place] [address] [phone] [email or web address] Evaluators:  Bertram Savin, OT and Mertie Clause, OT  [employer] [facility]) [place] ([place]!!) Physical [place] [address] [place], [place]  [address] [phone] [email or web address] Evaluators:  Bernadene Bell, KT; Heron Sabins, KT;  Shirlee Latch, KT (KT=kiniesotherapist)   Clinical evaluations only:  Includes clinical testing, refers to other programs or local certified driving instructor for behind the wheel testing.  [facility] at  [facility] (outpatient Rehab) [place] [address] [place], [place] [address] [phone] for scheduling [email or web address] Evaluators:  Valentino Hue, OT; Haynes Hoehn, OT  Other area clinical evaluators available upon request including [facility], [facility] and [facility].       Resource List What is a Warden/ranger: Your Road Ahead - A Guide to [employer] Evaluations http://www.thehartford.com/resources/mature-market-excellence/publications-on-aging  Association for Musician - Disability and Driving Fact Sheets http://www.aded.net/?page=510  Driving after a Brain Injury: Brain Injury Association of America [place].be?A=SearchResult&SearchID=9495675&ObjectID=2758842&ObjectType=35  Driving with Adaptive Equipment: Chi[employer] Association DebtRide.[email or web address].au

## 2017-11-23 NOTE — Therapy (Signed)
[facility] [address] [place], [name], [address] Phone: [phone]   Fax:  [phone]  Physical Therapy Treatment and Discharge Summary  Patient Details  Name: David [name] MRN: [id number] Date of Birth: 1940/12/13 Referring Provider: Dr. Naaman Plummer   Encounter Date: 11/23/2017  PT End of Session - 11/23/17 1517    Visit Number  12    Number of Visits  17    Date for PT Re-Evaluation  12/17/17    Authorization Type  Medicare; [employer]    Authorization Time Period  09/18/17 to 12/17/17    PT Start Time  1445    PT Stop Time  1525    PT Time Calculation (min)  40 min    Equipment Utilized During Treatment  Gait belt    Activity Tolerance  Patient tolerated treatment well low BP 88/48    Behavior During Therapy  WFL for tasks assessed/performed       Past Medical History:  Diagnosis Date  . Acute cholecystitis s/p lap cholecystectomy 03/05/2017 03/04/2017  . Anemia, iron deficiency   . Anxiety   . Arthritis   . Atrial fibrillation with rapid ventricular response ([place])   . BPH (benign prostatic hypertrophy)   . CAD (coronary artery disease)    Nonobstructive CAD per cath  . Cardiac pacemaker in situ   . CHF (congestive heart failure) ([facility])   . Chronic ulcer of right foot ([place])   . CKD (chronic kidney disease) stage 4, GFR 15-29 ml/min (HCC) 09/26/2017  . CKD (chronic kidney disease), stage III ([facility]) secondary to DM and HTN   nephrologist-  [name]  . Coronary artery disease involving native coronary artery of native heart without angina pectoris   . Diastolic dysfunction   . Dyspnea   . 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 History of cellulitis    right great toe 10-25-2014  . History of skin cancer   . HOCM (hypertrophic obstructive cardiomyopathy) ([facility])   . Hyperlipidemia associated with type 2 diabetes mellitus ([place]) 10/25/2013  . Hypertension    . Hypertension associated with diabetes ([place]) 10/25/2013  . 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 Insulin dependent type 2 diabetes mellitus ([facility]) 1991   followd by [name]--  has insulin pump  . Insulin pump in place   . OSA on CPAP   . 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 Peripheral neuropathy    severe  . Peripheral vascular disease ([facility])    bilateral lower extremities  . PVD (peripheral vascular disease) ([facility])   . Rib fracture 07/24/2015  . Seasonal and perennial allergic rhinitis 10/25/2013  . Secondary hyperparathyroidism of renal origin ([facility])   . 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 Sinus node dysfunction (HCC)   . Sleep apnea   . Syncope 07/24/2015    Past Surgical History:  Procedure Laterality Date  . AMPUTATION OF REPLICATED TOES  Mar 0100   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)  . AMPUTATION TOE Right 03/12/2015   Procedure: RIGHT HALLUS AMPUTATION ;  Surgeon: [name], MD;  Location: [facility];  Service: Podiatry;  Laterality: Right;  . CARDIAC CATHETERIZATION  11-25-2010   [place], [place]   Nonobstructive CAD  . CARDIAC PACEMAKER PLACEMENT  Nov 2009   [employer]  . CHOLECYSTECTOMY N/A 03/05/2017   Procedure: LAPAROSCOPIC CHOLECYSTECTOMY WITH INTRAOPERATIVE CHOLANGIOGRAM;  Surgeon: [name], MD;  [facility];  Service:  General;  Laterality: N/A;  . EP IMPLANTABLE DEVICE N/A 06/03/2015   Procedure: PPM Generator Changeout;  Surgeon: [name], MD;  Location: [facility];  Service: Cardiovascular;  Laterality: N/A;  . EXCISION BONE CYST Right 03/06/2015   Procedure: BONE BIOPSIES OF RIGHT FOOT;  Surgeon: [name], MD;  Location: [facility];  Service: Podiatry;  Laterality: Right;  . ORIF ANKLE FRACTURE Left 11/06/2014   Procedure: OPEN REDUCTION INTERNAL FIXATION (ORIF) LEFT  ANKLE FRACTURE;  Surgeon: [name], MD;  Location: [facility];  Service: Orthopedics;  Laterality: Left;   . TEE WITHOUT CARDIOVERSION N/A 09/01/2017   Procedure: TRANSESOPHAGEAL ECHOCARDIOGRAM (TEE);  Surgeon: [name], MD;  Location: [facility];  Service: Cardiovascular;  Laterality: N/A;  . TOTAL KNEE ARTHROPLASTY    . VEIN LIGATION AND STRIPPING      There were no vitals filed for this visit.  Subjective Assessment - 11/23/17 1451    Subjective  No falls to report. Has graduated from OT. Plans to go to the [employer] when his wife goes to do her water classes (at least 2x/wk). 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  about 10 minutes    Patient Stated Goals  get back to walking without a cane; feeding the birds in his back yard (filling feeders)    Currently in Pain?  No/denies                       [facility] Adult PT Treatment/Exercise - 11/23/17 1503      Ambulation/Gait   Gait velocity  32.8/13.09=2.51      Knee/Hip Exercises: Aerobic   Tread Mill  3.5 min at 1.0 down to 0.9 mph HR 76    Stepper  Sci-Fit stepper L1.0 x        Educated on [employer] at [employer]. Benefits of supervised exercise sessions to transition [facility] PT 2x[facility] and having an RN monitor his use of equipment and progress. He is open to the idea and agreed to allow me to make the referral.       PT Education - 11/23/17 1521    Education Details  results of LTGs    Person(s) Educated  Patient    Methods  Explanation    Comprehension  Verbalized understanding       PT Short Term Goals - 11/13/17 1550      PT SHORT TERM GOAL #1   Title  Patient will complete HEP with  supervision of family to assure proper technique. (Target for all STGs 11/01/2017--date reflects 4 weeks after 1st treatment 10/03/17)    Time  4    Period  Weeks    Status  Achieved      PT SHORT TERM GOAL #2   Title  Patient will improve TUG normal to <13.5 seconds with SPC to demonstrate lesser fall risk.     Baseline  11/13/2017 Goal met today 9.59 sec with SPC.    Time  4    Period  Weeks    Status  Achieved      PT SHORT TERM GOAL #3   Title  Patient will improve Berg score >=44/56 demonstrating adequate balance for use of cane indoors.     Baseline  11/13/2017 Met today. 49/56    Time  4    Period  Weeks    Status  Achieved      PT SHORT TERM GOAL #4   Title  Patient can verbalize signs/symptoms of CVA, appropriate actions to take, and understanding of need to get to hospital within 3 hours of onset of symptoms    Baseline  11/13/2017 Met today.    Time  4    Period  Weeks    Status  Achieved        PT Long Term Goals - 11/23/17 1521      PT LONG TERM GOAL #1   Title  Patient will be independent with HEP (using handouts) for balance and strengthening. (Target for all LTG's 12/02/2017--based on late start date)    Time  8    Period  Weeks    Status  Achieved      PT LONG TERM GOAL #2   Title  Patient will improve gait velocity to >=2.62 ft/sec with least restrictive assistive device (indicative of velocity for safe community ambulation)    Baseline  7/11 2.51 ft/sec    Time  8    Period  Weeks    Status  Partially Met      PT LONG TERM GOAL #3   Title  Patient will improve Berg to >=47/56 indicative of safe use of cane outdoors.     Baseline  11/13/2017 Met today 49/56.    Time  8    Period  Weeks    Status  Achieved      PT LONG TERM GOAL #4   Title  Patient able to ambulate >= 200 ft outdoors with cane modified independent over unlevel ground to allow safe walking to his bird feeders in his back yard.     Baseline  11/15/2017 Met on 10/30/2017 in note by Barry Brunner.     Time  8    Period  Weeks    Status  Achieved            Plan - 11/23/17 1522    Clinical Impression Statement  Patient met 3 of 4 LTGs and made progress towards 4th goal (just not to goal level). He is planning to continue to exercise at the [employer] (see subjective). Patient reports he has lost >40 lbs since his CVA and feels much better.     Rehab Potential  Good    Clinical Impairments Affecting Rehab Potential  - Multiple co-morbidities; + very motivated    PT Frequency  2x / week    PT Duration  8 weeks    PT Treatment/Interventions  ADLs/Self Care Home Management;Aquatic Therapy;Electrical Stimulation;Gait training;DME Instruction;Stair training;Functional mobility training;Therapeutic activities;Therapeutic exercise;Balance training;Neuromuscular re-education;Cognitive remediation;Manual techniques;Orthotic Fit/Training;Patient/family education;Passive range of motion    PT Home Exercise Plan  reports no HEP given on CIR, wife confirms    Consulted and Agree with Plan of Care  Patient    Family Member Consulted  wife       Patient will benefit from skilled therapeutic intervention in order to improve the following deficits and impairments:  Abnormal gait, Decreased activity tolerance, Decreased balance, Decreased cognition, Decreased mobility, Decreased knowledge of use  of DME, Decreased strength, Increased edema, Impaired sensation, Postural dysfunction, Impaired UE functional use, Obesity  Visit Diagnosis: Muscle weakness (generalized)  Other abnormalities of gait and mobility     Problem List Patient Active Problem List   Diagnosis Date Noted  . CKD (chronic kidney disease) stage 4, GFR 15-29 ml/min (HCC) 09/26/2017  . 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 Diastolic dysfunction   . Coronary artery disease involving native coronary artery of native heart without angina pectoris   . Cardiac pacemaker in situ   .  Atrial fibrillation with rapid ventricular response ([place])   . PVD (peripheral vascular disease) ([place])   . Lower extremity edema 04/21/2017  . Anxiety 03/22/2017  . Acute cholecystitis s/p lap cholecystectomy 03/05/2017 03/04/2017  . HOCM (hypertrophic obstructive cardiomyopathy) ([place]) 03/04/2017  . 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 Fatigue 01/27/2017  . Low vitamin B12 level 01/27/2017  . Syncope 07/24/2015  . 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 OSA on CPAP 10/26/2014  . Back pain 10/16/2014  . Seasonal and perennial allergic rhinitis 10/25/2013  . Hypertension associated with diabetes ([place]) 10/25/2013  . Hyperlipidemia associated with type 2 diabetes mellitus ([place]) 10/25/2013  . Morbid obesity ([place]) 10/25/2013   PHYSICAL THERAPY DISCHARGE SUMMARY  Visits from Start of Care: 12  Current functional level related to goals / functional outcomes: See goals section above   Remaining deficits: Decreased balance with resulting decr gait velocity (although has improved)   Education / Equipment: HEP  Plan: Patient agrees to discharge.  Patient goals were partially met. Patient is being discharged due to being pleased with the current functional level.  ?????       Rexanne Mano, PT 11/23/2017, 3:30 PM  [facility] [address] [place], [name], [address] Phone: [phone]   Fax:  [phone]  Name: David [name] MRN: [id number] Date of Birth: [birth year]

## 2017-11-24 NOTE — Therapy (Signed)
[facility] [address] [place], [name], [address] Phone: [phone]   Fax:  [phone]  Occupational Therapy Treatment  Patient Details  Name: David [name] MRN: [id number] Date of Birth: 1941-03-20 Referring Provider: Dr. Naaman Plummer   Encounter Date: 11/23/2017  OT End of Session - 11/24/17 1743    Visit Number  12    Number of Visits  17    Date for OT Re-Evaluation  11/18/17    Authorization Type  Medicare/ [employer]    OT Start Time  1410 2 units only, d/c visit    OT Stop Time  1445    OT Time Calculation (min)  35 min    Activity Tolerance  Patient tolerated treatment well    Behavior During Therapy  [facility] for tasks assessed/performed       Past Medical History:  Diagnosis Date  . Acute cholecystitis s/p lap cholecystectomy 03/05/2017 03/04/2017  . Anemia, iron deficiency   . Anxiety   . Arthritis   . Atrial fibrillation with rapid ventricular response ([place])   . BPH (benign prostatic hypertrophy)   . CAD (coronary artery disease)    Nonobstructive CAD per cath  . Cardiac pacemaker in situ   . CHF (congestive heart failure) ([place])   . Chronic ulcer of right foot ([place])   . CKD (chronic kidney disease) stage 4, GFR 15-29 ml/min (HCC) 09/26/2017  . CKD (chronic kidney disease), stage III ([place]) secondary to DM and HTN   nephrologist-  [name]  . Coronary artery disease involving native coronary artery of native heart without angina pectoris   . Diastolic dysfunction   . Dyspnea   . 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 History of cellulitis    right great toe 10-25-2014  . History of skin cancer   . HOCM (hypertrophic obstructive cardiomyopathy) ([place])   . Hyperlipidemia associated with type 2 diabetes mellitus ([place]) 10/25/2013  . Hypertension   . Hypertension associated with diabetes ([place]) 10/25/2013  . IDDM (insulin dependent diabetes mellitus) - on  insulin pump 03/04/2017  . Insulin dependent type 2 diabetes mellitus ([place]) 1991   followd by [name]--  has insulin pump  . Insulin pump in place   . OSA on CPAP   . 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 Peripheral neuropathy    severe  . Peripheral vascular disease ([place])    bilateral lower extremities  . PVD (peripheral vascular disease) ([place])   . Rib fracture 07/24/2015  . Seasonal and perennial allergic rhinitis 10/25/2013  . Secondary hyperparathyroidism of renal origin ([place])   . 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 Sinus node dysfunction (HCC)   . Sleep apnea   . Syncope 07/24/2015    Past Surgical History:  Procedure Laterality Date  . AMPUTATION OF REPLICATED TOES  Mar 4742   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)  . AMPUTATION TOE Right 03/12/2015   Procedure: RIGHT HALLUS AMPUTATION ;  Surgeon: [name], MD;  Location: [facility];  Service: Podiatry;  Laterality: Right;  . CARDIAC CATHETERIZATION  11-25-2010   [place], [place]   Nonobstructive CAD  . CARDIAC PACEMAKER PLACEMENT  Nov 2009   [employer]  . CHOLECYSTECTOMY N/A 03/05/2017   Procedure: LAPAROSCOPIC CHOLECYSTECTOMY WITH INTRAOPERATIVE CHOLANGIOGRAM;  Surgeon: [name], MD;  [facility];  Service: General;  Laterality: N/A;  . EP IMPLANTABLE DEVICE N/A 06/03/2015   Procedure: PPM Generator Changeout;  Surgeon: [name],  MD;  Location: [facility];  Service: Cardiovascular;  Laterality: N/A;  . EXCISION BONE CYST Right 03/06/2015   Procedure: BONE BIOPSIES OF RIGHT FOOT;  Surgeon: [name], MD;  Location: [place];  Service: Podiatry;  Laterality: Right;  . ORIF ANKLE FRACTURE Left 11/06/2014   Procedure: OPEN REDUCTION INTERNAL FIXATION (ORIF) LEFT  ANKLE FRACTURE;  Surgeon: [name], MD;  Location: [facility];  Service: Orthopedics;  Laterality: Left;  . TEE WITHOUT CARDIOVERSION N/A 09/01/2017   Procedure: TRANSESOPHAGEAL ECHOCARDIOGRAM (TEE);  Surgeon: [name], MD;  Location: [facility];  Service: Cardiovascular;  Laterality: N/A;  . TOTAL KNEE ARTHROPLASTY    . VEIN LIGATION AND STRIPPING      There were no vitals filed for this visit.  Subjective Assessment - 11/24/17 1745    Subjective   Denies pain    Patient Stated Goals  to get back to prior functional level    Currently in Pain?  No/denies               Therapist checked progress towards goals. Ambulating while locating items to simulate driving, multi tasking, 80% accuracy initially, then pt located remaining items. Tabletop activities for alternating attention and memory, min difficulty.            OT Education - 11/24/17 1741    Education provided  Yes    Education Details  progress towards goals, recommendation that pt' completes ST before considering driving and if his cognition has not improved when he d/c from speech he should consider a driving eval, info regarding driving eval programs    Person(s) Educated  Patient    Methods  Explanation;Handout    Comprehension  Verbalized understanding       OT Short Term Goals - 10/25/17 1222      OT SHORT TERM GOAL #1   Title  I with HEP for UE strength    Status  Achieved      OT SHORT TERM GOAL #2   Title  Pt will verbalize understanding of compensatory strategies for short term memory deficits.    Status  Achieved      OT SHORT TERM GOAL #3   Title  Pt will perform basic home management/ cooking with supervision and no LOB demonstrating good safety awareness.    Time  4    Period  Weeks    Status  Achieved      OT SHORT TERM GOAL #4   Title  Pt demonstrate selective attention to a functional task  in a busy environment x 20 without redirection.    Status  Achieved      OT SHORT TERM GOAL #5   Title  Further assess cogntion and set additional goals prn    Status  Deferred        OT Long Term Goals - 11/23/17 1427      OT LONG TERM GOAL #1   Title  Pt will perform home managment and  cooking activities modified independently demonstrating good safety awareness and no LOB.    Status  Achieved      OT LONG TERM GOAL #2   Title  Pt will demonstrate ability to perfrom a physical and cogntive task simultaneously with 90% or better accuracy.    Status  Partially Met 80-93 % scanning environment today 80%, 93% last visit      OT LONG TERM GOAL #3   Title  Pt will  perform basic money exchange for functional setting with 90% or greater accuracy    Status  Deferred            Plan - 11/23/17 1410    Clinical Impression Statement  Pt is progressing towards goals. Pt agrees with plans for d/c.    Occupational Profile and client history currently impacting functional performance  Prior to CVA pt was modified independent with all basic ADLS, IADLS and ambulating with a cane, he enjoys feeding the birds.PMH:CVA, history of type 2 diabetes mellitus with neuropathy, CAD, cardiac arrest in hospital, R toe amputations, CKD stage III, age-related macular degeneration    Occupational performance deficits (Please refer to evaluation for details):  ADL's;IADL's;Social Participation    Current Impairments/barriers affecting progress:  cogntive deficits, toe amputations, neuropathy affecting mobility    OT Frequency  2x / week    OT Duration  8 weeks    OT Treatment/Interventions  Self-care/ADL training;Therapeutic exercise;Visual/perceptual remediation/compensation;Patient/family education;Neuromuscular education;Therapeutic activities;Functional Mobility Training;Energy conservation;Cryotherapy;Ultrasound;Passive range of motion;Cognitive remediation/compensation;Manual Therapy;DME and/or AE instruction;Fluidtherapy;Moist Heat;Paraffin    Plan  d/c OT    Consulted and Agree with Plan of Care  Patient       Patient will benefit from skilled therapeutic intervention in order to improve the following deficits and impairments:  Abnormal gait, Decreased cognition, Decreased knowledge of use  of DME, Impaired flexibility, Impaired vision/preception, Decreased mobility, Decreased activity tolerance, Decreased endurance, Impaired UE functional use, Difficulty walking, Decreased safety awareness, Decreased knowledge of precautions, Decreased balance  Visit Diagnosis: No diagnosis found.    Problem List Patient Active Problem List   Diagnosis Date Noted  . CKD (chronic kidney disease) stage 4, GFR 15-29 ml/min (HCC) 09/26/2017  . 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 Diastolic dysfunction   . Coronary artery disease involving native coronary artery of native heart without angina pectoris   . Cardiac pacemaker in situ   . Atrial fibrillation with rapid ventricular response ([place])   . PVD (peripheral vascular disease) ([place])   . Lower extremity edema 04/21/2017  . Anxiety 03/22/2017  . Acute cholecystitis s/p lap cholecystectomy 03/05/2017 03/04/2017  . HOCM (hypertrophic obstructive cardiomyopathy) ([place]) 03/04/2017  . 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 Fatigue 01/27/2017  . Low vitamin B12 level 01/27/2017  . Syncope 07/24/2015  . 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 OSA on CPAP 10/26/2014  . Back pain 10/16/2014  . Seasonal and perennial allergic rhinitis 10/25/2013  . Hypertension associated with diabetes ([place]) 10/25/2013  . Hyperlipidemia associated with type 2 diabetes mellitus ([place]) 10/25/2013  . Morbid obesity ([place]) 10/25/2013    RINE,KATHRYN 11/24/2017, 5:45 PM Theone Murdoch, OTR/L [phone] Phone: [phone] 5:47 PM 11/24/17 [facility] [address] [place], [name], [address] Phone: [phone]   Fax:  [phone]  Name: David [name] MRN: [id number] Date of Birth: 24-Jul-1940

## 2017-11-27 ENCOUNTER — Other Ambulatory Visit: Payer: Self-pay | Admitting: Endocrinology

## 2017-11-27 ENCOUNTER — Encounter: Payer: Self-pay | Admitting: Family Medicine

## 2017-11-27 ENCOUNTER — Ambulatory Visit (INDEPENDENT_AMBULATORY_CARE_PROVIDER_SITE_OTHER): Payer: Medicare Other | Admitting: Family Medicine

## 2017-11-27 ENCOUNTER — Ambulatory Visit: Payer: Medicare Other | Admitting: Speech Pathology

## 2017-11-27 VITALS — BP 118/62 | HR 64 | Temp 98.1°F | Ht 76.0 in | Wt 276.2 lb

## 2017-11-27 DIAGNOSIS — E1159 Type 2 diabetes mellitus with other circulatory complications: Secondary | ICD-10-CM | POA: Diagnosis not present

## 2017-11-27 DIAGNOSIS — E119 Type 2 diabetes mellitus without complications: Secondary | ICD-10-CM

## 2017-11-27 DIAGNOSIS — R41841 Cognitive communication deficit: Secondary | ICD-10-CM

## 2017-11-27 DIAGNOSIS — E1169 Type 2 diabetes mellitus with other specified complication: Secondary | ICD-10-CM

## 2017-11-27 DIAGNOSIS — E785 Hyperlipidemia, unspecified: Secondary | ICD-10-CM | POA: Diagnosis not present

## 2017-11-27 DIAGNOSIS — Z794 Long term (current) use of insulin: Secondary | ICD-10-CM

## 2017-11-27 DIAGNOSIS — I152 Hypertension secondary to endocrine disorders: Secondary | ICD-10-CM

## 2017-11-27 DIAGNOSIS — I10 Essential (primary) hypertension: Secondary | ICD-10-CM

## 2017-11-27 DIAGNOSIS — D692 Other nonthrombocytopenic purpura: Secondary | ICD-10-CM | POA: Insufficient documentation

## 2017-11-27 DIAGNOSIS — R2689 Other abnormalities of gait and mobility: Secondary | ICD-10-CM | POA: Diagnosis not present

## 2017-11-27 DIAGNOSIS — M6281 Muscle weakness (generalized): Secondary | ICD-10-CM | POA: Diagnosis not present

## 2017-11-27 DIAGNOSIS — H60501 Unspecified acute noninfective otitis externa, right ear: Secondary | ICD-10-CM

## 2017-11-27 DIAGNOSIS — IMO0001 Reserved for inherently not codable concepts without codable children: Secondary | ICD-10-CM

## 2017-11-27 DIAGNOSIS — I69315 Cognitive social or emotional deficit following cerebral infarction: Secondary | ICD-10-CM | POA: Diagnosis not present

## 2017-11-27 DIAGNOSIS — R29818 Other symptoms and signs involving the nervous system: Secondary | ICD-10-CM | POA: Diagnosis not present

## 2017-11-27 MED ORDER — ACETIC ACID 2 % OT SOLN: 4.0000 [drp] | Freq: Four times a day (QID) | OTIC | 0 refills | Status: AC

## 2017-11-27 NOTE — Assessment & Plan Note (Signed)
Managed by endocrinology.  Foot exam performed today.  Will obtain records regarding most recent eye exam.  Continue current medications.

## 2017-11-27 NOTE — Assessment & Plan Note (Signed)
At goal.  Continue lisinopril and Imdur.  Seems to have some midday fatigue which may be related to his beta-blocker and nitrate use.  Given his extensive heart history including HOCM and afib, will defer to cardiology for further management.  May benefit from decreasing dose of Coreg.

## 2017-11-27 NOTE — Assessment & Plan Note (Signed)
Secondary to Plavix use.

## 2017-11-27 NOTE — Progress Notes (Signed)
   Subjective:  David [name] is a 77 y.o. male who presents today with a chief complaint of right ear drainage.   HPI:  Right Ear Drainage, Acute Problem Symptoms started yesterday.  Initially had some itching to his right ear.  He pulled it his earlobe and noticed bloody fluid draining out of his ear.  Also has some muffled hearing at this time.  No ear [name].  No fevers.  Still has a little bit of drainage to the area.  No recent water exposure aside from normal showers.  No treatments tried.  No other obvious alleviating or aggravating factors.  HTN, established problem, stable Currently on Coreg 25 mg twice daily, Imdur 30 mg daily, and lisinopril 5 mg daily.  He has occasional midday fatigue, but otherwise tolerating these well without side effects.  No reported chest [name] or shortness of breath.  Type 2 diabetes, established problem, stable Currently on Victoza 1.2 mg daily and insulin via his insulin pump.  Recently had eye exam done.  Hyperlipidemia, established problem, stable On Lipitor 10 mg daily.  Tolerating well.  ROS: Per HPI  PMH: He reports that he quit smoking about 33 years ago. He has a 60.00 pack-year smoking history. He has never used smokeless tobacco. He reports that he drinks about 0.6 oz of alcohol per week. He reports that he does not use drugs.  Objective:  Physical Exam: BP 118/62 (BP Location: Left Arm, Patient Position: Sitting, Cuff Size: Normal)   Pulse 64   Temp 98.1 F (36.7 C) (Oral)   Ht 6\' 4"  (1.93 m)   Wt 276 lb 3.2 oz (125.3 kg)   SpO2 98%   BMI 33.62 kg/m   Wt Readings from Last 3 Encounters:  11/27/17 276 lb 3.2 oz (125.3 kg)  10/31/17 269 lb (122 kg)  10/27/17 281 lb 9.6 oz (127.7 kg)  Gen: NAD, resting comfortably CV: RRR with no murmurs appreciated Pulm: NWOB, CTAB with no crackles, wheezes, or rhonchi  Diabetic Foot Exam - Simple   Simple Foot Form Diabetic Foot exam was performed with the following findings:  Yes 11/27/2017   2:11 PM  Visual Inspection See comments:  Yes Sensation Testing See comments:  Yes Pulse Check Posterior Tibialis and Dorsalis pulse intact bilaterally:  Yes Comments Evidence of bilateral peripheral neuropathy noted.  s/p right great toe and second toe amputation.  Diffuse arthropathy.  No areas of skin breakdown.  Diminished sensation to light touch throughout.  Monofilament testing negative bilaterally.    Assessment/Plan:  IDDM (insulin dependent diabetes mellitus) - on insulin pump Managed by endocrinology.  Foot exam performed today.  Will obtain records regarding most recent eye exam.  Continue current medications.  Hypertension associated with diabetes ([place]) At goal.  Continue lisinopril and Imdur.  Seems to have some midday fatigue which may be related to his beta-blocker and nitrate use.  Given his extensive heart history including HOCM and afib, will defer to cardiology for further management.  May benefit from decreasing dose of Coreg.  Hyperlipidemia associated with type 2 diabetes mellitus (HCC) Stable.  Continue Lipitor 10 mg daily.  Senile purpura ([place]) Secondary to Plavix use.  Otitis externa Start acetic acid drops.  Discussed reasons to return to care.  Follow-up as needed.  David [name]. David Pain, MD 11/27/2017 2:14 PM

## 2017-11-27 NOTE — Patient Instructions (Signed)
It was very nice to see you today!  You have an ear infection. Please start the drops. Let me know if your symptoms worsen or do not improve over the next 5-7 days.  Please ask your heart for about decreasing your dose of Coreg.  No other medication changes today.  Please come back to see me in 6 months, or sooner as needed.6  Take care, Dr [name]

## 2017-11-27 NOTE — Assessment & Plan Note (Signed)
Stable.  Continue Lipitor 10 mg daily. 

## 2017-11-28 NOTE — Therapy (Signed)
[facility] [address] [place], [name], [address] Phone: [phone]   Fax:  [phone]  Speech Language Pathology Treatment  Patient Details  Name: David [name] MRN: [id number] Date of Birth: 1941/01/05 Referring Provider: Dr. Naaman Plummer   Encounter Date: 11/27/2017  End of Session - 11/27/17 1620    Visit Number  5    Number of Visits  17    Date for SLP Re-Evaluation  12/22/17    SLP Start Time  1619    SLP Stop Time   1708    SLP Time Calculation (min)  49 min    Activity Tolerance  Patient tolerated treatment well       Past Medical History:  Diagnosis Date  . Acute cholecystitis s/p lap cholecystectomy 03/05/2017 03/04/2017  . Anemia, iron deficiency   . Anxiety   . Arthritis   . Atrial fibrillation with rapid ventricular response ([place])   . BPH (benign prostatic hypertrophy)   . CAD (coronary artery disease)    Nonobstructive CAD per cath  . Cardiac pacemaker in situ   . CHF (congestive heart failure) ([place])   . Chronic ulcer of right foot ([place])   . CKD (chronic kidney disease) stage 4, GFR 15-29 ml/min (HCC) 09/26/2017  . CKD (chronic kidney disease), stage III ([place]) secondary to DM and HTN   nephrologist-  [name]  . Coronary artery disease involving native coronary artery of native heart without angina pectoris   . Diastolic dysfunction   . Dyspnea   . 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 History of cellulitis    right great toe 10-25-2014  . History of skin cancer   . HOCM (hypertrophic obstructive cardiomyopathy) ([place])   . Hyperlipidemia associated with type 2 diabetes mellitus ([place]) 10/25/2013  . Hypertension   . Hypertension associated with diabetes ([place]) 10/25/2013  . 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 Insulin dependent type 2 diabetes mellitus ([place]) 1991   followd by [name]--  has insulin pump  .  Insulin pump in place   . OSA on CPAP   . 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 Peripheral neuropathy    severe  . Peripheral vascular disease ([place])    bilateral lower extremities  . PVD (peripheral vascular disease) ([place])   . Rib fracture 07/24/2015  . Seasonal and perennial allergic rhinitis 10/25/2013  . Secondary hyperparathyroidism of renal origin ([place])   . 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 Sinus node dysfunction (HCC)   . Sleep apnea   . Syncope 07/24/2015    Past Surgical History:  Procedure Laterality Date  . AMPUTATION OF REPLICATED TOES  Mar 0350   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)  . AMPUTATION TOE Right 03/12/2015   Procedure: RIGHT HALLUS AMPUTATION ;  Surgeon: [name], MD;  Location: [facility];  Service: Podiatry;  Laterality: Right;  . CARDIAC CATHETERIZATION  11-25-2010   [place], [place]   Nonobstructive CAD  . CARDIAC PACEMAKER PLACEMENT  Nov 2009   [employer]  . CHOLECYSTECTOMY N/A 03/05/2017   Procedure: LAPAROSCOPIC CHOLECYSTECTOMY WITH INTRAOPERATIVE CHOLANGIOGRAM;  Surgeon: [name], MD;  [facility];  Service: General;  Laterality: N/A;  . EP IMPLANTABLE DEVICE N/A 06/03/2015   Procedure: PPM Generator Changeout;  Surgeon: [name], MD;  Location: [facility];  Service: Cardiovascular;  Laterality: N/A;  . EXCISION BONE CYST Right 03/06/2015   Procedure: BONE BIOPSIES  OF RIGHT FOOT;  Surgeon: [name], MD;  Location: [facility];  Service: Podiatry;  Laterality: Right;  . ORIF ANKLE FRACTURE Left 11/06/2014   Procedure: OPEN REDUCTION INTERNAL FIXATION (ORIF) LEFT  ANKLE FRACTURE;  Surgeon: [name], MD;  Location: [facility];  Service: Orthopedics;  Laterality: Left;  . TEE WITHOUT CARDIOVERSION N/A 09/01/2017   Procedure: TRANSESOPHAGEAL ECHOCARDIOGRAM (TEE);  Surgeon: [name], MD;  Location: [facility];  Service: Cardiovascular;  Laterality: N/A;  . TOTAL KNEE ARTHROPLASTY    . VEIN  LIGATION AND STRIPPING      There were no vitals filed for this visit.  Subjective Assessment - 11/27/17 1620    Subjective  "The homework was a little tough."    Currently in Pain?  No/denies            ADULT SLP TREATMENT - 11/27/17 1619      General Information   Behavior/Cognition  Alert;Cooperative;Pleasant mood    Patient Positioning  Upright in chair      Treatment Provided   Treatment provided  Cognitive-Linquistic      Cognitive-Linquistic Treatment   Treatment focused on  Cognition    Skilled Treatment  Patient arrived with homework, stating he completed but expressing frustration over a deductive reasoning puzzle given during a previous session. Pt eager to complete; he required mod-max A for alternating attention, reasoning. Slow processing with extended time required. Pt stated, "This is hard" and SLP explained this was due to pt's deficit areas. Education provided re: level of cognitive tasks recommended to complete at home and taking breaks if he is experiencing frustration. Pt required mod-max A to ID and correct errors in time word problems. Simple time calculations with occasional mod A, 70% accuracy.      Assessment / Recommendations / Plan   Plan  Continue with current plan of care      Progression Toward Goals   Progression toward goals  Progressing toward goals       SLP Education - 11/28/17 0612    Education provided  Yes    Education Details  deficit areas, tasks for homework, take break when frustrated    Person(s) Educated  Patient    Methods  Explanation;Demonstration    Comprehension  Verbalized understanding;Need further instruction       SLP Short Term Goals - 11/28/17 0615      SLP SHORT TERM GOAL #1   Title  Pt will selectively attend to mod complex cognitive linguistic task for 10 minutes with distractions and occasional min redirection over 3 sessions    Time  2    Period  Weeks    Status  On-going      SLP SHORT TERM GOAL #2    Title  Pt will complete mod complex organization, reasoning, problem solving tasks with 85% accuracy and occasional min A over 3 sessions    Time  2    Period  Weeks    Status  On-going      SLP SHORT TERM GOAL #3   Title  Pt will ID errors and self correct 3/5 errors with occasional min A over 3 sessions    Time  2    Period  Weeks    Status  On-going      SLP SHORT TERM GOAL #4   Title  Pt will utilize external aids for medication, schedule management with occasional min A from family outside of therapy over 3 sessions  Baseline  (reported) 11-15-17, (reported) 11-21-17    Time  2    Period  Weeks    Status  On-going       SLP Long Term Goals - 11/28/17 0616      SLP LONG TERM GOAL #1   Title  Pt will alternate attention between 2 mod complex cognitive linguistic tasks for 15 minutes with 85% on each and rare min A over 3 sessions    Time  6    Period  Weeks    Status  On-going      SLP LONG TERM GOAL #2   Title  Pt will self correct errors 4/5 errors with occasional min A over 3 sessions    Time  6    Period  Weeks    Status  On-going      SLP LONG TERM GOAL #3   Title  Pt will utilize external aids for medication, schedule management with rare min A from family outside of therapy over 5 sessions    Time  6    Period  Weeks    Status  On-going       Plan - 11/28/17 1607    Clinical Impression Statement  Mr. Weatherall presents with cognitive impairments seen most acutely today in problem solving, planning, and intellectual and emergent awareness. Pt began to demo some intellectual awareness to cog-linguistic deficits. Cues again required for problem solving and error awareness. See "skilled treatment" for more detail. I recommend cont'd skilled ST to address cognitive communication impairments in order to increase independence, decrease caregiver burden and improve quality of life.     Speech Therapy Frequency  2x / week    Treatment/Interventions  Cognitive  reorganization;Compensatory strategies;Language facilitation;Compensatory techniques;Cueing hierarchy;Internal/external aids;Functional tasks;SLP instruction and feedback;Patient/family education;Environmental controls    Potential to Achieve Goals  Good    Consulted and Agree with Plan of Care  Patient       Patient will benefit from skilled therapeutic intervention in order to improve the following deficits and impairments:   Cognitive communication deficit    Problem List Patient Active Problem List   Diagnosis Date Noted  . Senile purpura ([place]) 11/27/2017  . CKD (chronic kidney disease) stage 4, GFR 15-29 ml/min (HCC) 09/26/2017  . 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 Diastolic dysfunction   . Coronary artery disease involving native coronary artery of native heart without angina pectoris   . Cardiac pacemaker in situ   . Atrial fibrillation with rapid ventricular response ([place])   . PVD (peripheral vascular disease) ([place])   . Lower extremity edema 04/21/2017  . Anxiety 03/22/2017  . Acute cholecystitis s/p lap cholecystectomy 03/05/2017 03/04/2017  . HOCM (hypertrophic obstructive cardiomyopathy) ([place]) 03/04/2017  . 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 Fatigue 01/27/2017  . Low vitamin B12 level 01/27/2017  . Syncope 07/24/2015  . 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 OSA on CPAP 10/26/2014  . Back pain 10/16/2014  . Seasonal and perennial allergic rhinitis 10/25/2013  . Hypertension associated with diabetes ([place]) 10/25/2013  . Hyperlipidemia associated with type 2 diabetes mellitus ([place]) 10/25/2013  . Morbid obesity ([place]) 10/25/2013   Deneise Lever, [place], [place] 11/28/2017, 6:17 AM  [facility] [address] [place], [name], [address] Phone: [phone]    Fax:  [phone]   Name: David Sokolowski  [name] MRN: [id number] Date of Birth: February 13, 1941

## 2017-11-30 ENCOUNTER — Ambulatory Visit: Payer: Medicare Other | Admitting: Speech Pathology

## 2017-11-30 DIAGNOSIS — R41841 Cognitive communication deficit: Secondary | ICD-10-CM | POA: Diagnosis not present

## 2017-11-30 DIAGNOSIS — I69315 Cognitive social or emotional deficit following cerebral infarction: Secondary | ICD-10-CM | POA: Diagnosis not present

## 2017-11-30 DIAGNOSIS — M6281 Muscle weakness (generalized): Secondary | ICD-10-CM | POA: Diagnosis not present

## 2017-11-30 DIAGNOSIS — R2689 Other abnormalities of gait and mobility: Secondary | ICD-10-CM | POA: Diagnosis not present

## 2017-11-30 DIAGNOSIS — R29818 Other symptoms and signs involving the nervous system: Secondary | ICD-10-CM | POA: Diagnosis not present

## 2017-11-30 NOTE — Therapy (Signed)
[facility] [address] [place], [name], [address] Phone: [phone]   Fax:  [phone]  Speech Language Pathology Treatment  Patient Details  Name: David [name] MRN: [id number] Date of Birth: 1940-11-18 Referring Provider: Dr. Naaman Plummer   Encounter Date: 11/30/2017  End of Session - 11/30/17 1621    Visit Number  6    Number of Visits  17    Date for SLP Re-Evaluation  12/22/17    SLP Start Time  1404    SLP Stop Time   1446    SLP Time Calculation (min)  42 min    Activity Tolerance  Patient tolerated treatment well       Past Medical History:  Diagnosis Date  . Acute cholecystitis s/p lap cholecystectomy 03/05/2017 03/04/2017  . Anemia, iron deficiency   . Anxiety   . Arthritis   . Atrial fibrillation with rapid ventricular response ([place])   . BPH (benign prostatic hypertrophy)   . CAD (coronary artery disease)    Nonobstructive CAD per cath  . Cardiac pacemaker in situ   . CHF (congestive heart failure) ([place])   . Chronic ulcer of right foot ([place])   . CKD (chronic kidney disease) stage 4, GFR 15-29 ml/min (HCC) 09/26/2017  . CKD (chronic kidney disease), stage III ([place]) secondary to DM and HTN   nephrologist-  [name]  . Coronary artery disease involving native coronary artery of native heart without angina pectoris   . Diastolic dysfunction   . Dyspnea   . 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 History of cellulitis    right great toe 10-25-2014  . History of skin cancer   . HOCM (hypertrophic obstructive cardiomyopathy) ([place])   . Hyperlipidemia associated with type 2 diabetes mellitus ([place]) 10/25/2013  . Hypertension   . Hypertension associated with diabetes ([place]) 10/25/2013  . 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 Insulin dependent type 2 diabetes mellitus ([place]) 1991   followd by [name]--  has insulin pump  .  Insulin pump in place   . OSA on CPAP   . 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 Peripheral neuropathy    severe  . Peripheral vascular disease ([place])    bilateral lower extremities  . PVD (peripheral vascular disease) ([place])   . Rib fracture 07/24/2015  . Seasonal and perennial allergic rhinitis 10/25/2013  . Secondary hyperparathyroidism of renal origin ([place])   . 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 Sinus node dysfunction (HCC)   . Sleep apnea   . Syncope 07/24/2015    Past Surgical History:  Procedure Laterality Date  . AMPUTATION OF REPLICATED TOES  Mar 5284   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)  . AMPUTATION TOE Right 03/12/2015   Procedure: RIGHT HALLUS AMPUTATION ;  Surgeon: [name], MD;  Location: [facility];  Service: Podiatry;  Laterality: Right;  . CARDIAC CATHETERIZATION  11-25-2010   [place], [place]   Nonobstructive CAD  . CARDIAC PACEMAKER PLACEMENT  Nov 2009   [employer]  . CHOLECYSTECTOMY N/A 03/05/2017   Procedure: LAPAROSCOPIC CHOLECYSTECTOMY WITH INTRAOPERATIVE CHOLANGIOGRAM;  Surgeon: [name], MD;  [facility];  Service: General;  Laterality: N/A;  . EP IMPLANTABLE DEVICE N/A 06/03/2015   Procedure: PPM Generator Changeout;  Surgeon: [name], MD;  Location: [facility];  Service: Cardiovascular;  Laterality: N/A;  . EXCISION BONE CYST Right 03/06/2015   Procedure: BONE BIOPSIES  OF RIGHT FOOT;  Surgeon: [name], MD;  Location: [facility];  Service: Podiatry;  Laterality: Right;  . ORIF ANKLE FRACTURE Left 11/06/2014   Procedure: OPEN REDUCTION INTERNAL FIXATION (ORIF) LEFT  ANKLE FRACTURE;  Surgeon: [name], MD;  Location: [facility];  Service: Orthopedics;  Laterality: Left;  . TEE WITHOUT CARDIOVERSION N/A 09/01/2017   Procedure: TRANSESOPHAGEAL ECHOCARDIOGRAM (TEE);  Surgeon: [name], MD;  Location: [facility];  Service: Cardiovascular;  Laterality: N/A;  . TOTAL KNEE ARTHROPLASTY    . VEIN  LIGATION AND STRIPPING      There were no vitals filed for this visit.  Subjective Assessment - 11/30/17 1409    Subjective  "I started doing the homework but I had to take a break."    Currently in Pain?  No/denies            ADULT SLP TREATMENT - 11/30/17 1407      General Information   Behavior/Cognition  Alert;Cooperative;Pleasant mood      Treatment Provided   Treatment provided  Cognitive-Linquistic      Cognitive-Linquistic Treatment   Treatment focused on  Cognition    Skilled Treatment  SLP targeted selective attention during mod complex organization/attention to detail task. Pt with loss of attention x1 during 12 minutes task, in which he self-redirected. Pt did check work for errors; 70% accuracy, but cues required to ID and correct 3 errors. SLP worked with pt in functional organization task (medication management)  cues to review medication dosage instructions. Pt ID'd 2/2 errors with verbal cue for double checking. SLP educated re: continuing to have assistance for double checking for accuracy when setting up medications at home.       Assessment / Recommendations / Plan   Plan  Continue with current plan of care      Progression Toward Goals   Progression toward goals  Progressing toward goals       SLP Education - 11/30/17 1621    Education provided  Yes    Education Details  errors with medication task due to decreased attention; need to have assistance double checking medications at home to ensure accuracy    Person(s) Educated  Patient    Methods  Explanation    Comprehension  Verbalized understanding       SLP Short Term Goals - 11/30/17 1410      SLP SHORT TERM GOAL #1   Title  Pt will selectively attend to mod complex cognitive linguistic task for 10 minutes with distractions and occasional min redirection over 3 sessions    Time  2    Period  Weeks    Status  On-going      SLP SHORT TERM GOAL #2   Title  Pt will complete mod complex  organization, reasoning, problem solving tasks with 85% accuracy and occasional min A over 3 sessions    Time  2    Period  Weeks    Status  On-going      SLP SHORT TERM GOAL #3   Title  Pt will ID errors and self correct 3/5 errors with occasional min A over 3 sessions    Time  2    Period  Weeks    Status  On-going      SLP SHORT TERM GOAL #4   Title  Pt will utilize external aids for medication, schedule management with occasional min A from family outside of therapy over 3 sessions  Time  2    Period  Weeks    Status  On-going       SLP Long Term Goals - 11/30/17 1415      SLP LONG TERM GOAL #1   Title  Pt will alternate attention between 2 mod complex cognitive linguistic tasks for 15 minutes with 85% on each and rare min A over 3 sessions    Time  6    Period  Weeks    Status  On-going      SLP LONG TERM GOAL #2   Title  Pt will self correct errors 4/5 errors with occasional min A over 3 sessions    Time  6    Period  Weeks    Status  On-going      SLP LONG TERM GOAL #3   Title  Pt will utilize external aids for medication, schedule management with rare min A from family outside of therapy over 5 sessions    Time  6    Period  Weeks    Status  On-going       Plan - 11/30/17 1622    Clinical Impression Statement  Mr. Helseth presents with cognitive impairments seen most acutely today in problem solving, planning, and intellectual and emergent awareness. Pt beginning to demo some intellectual awareness to cog-linguistic deficits. Cues again required for problem solving and to check for errors. See "skilled treatment" for more details. I recommend cont'd skilled ST to address cognitive communication impairments in order to increase independence, decrease caregiver burden and improve quality of life.     Speech Therapy Frequency  2x / week    Treatment/Interventions  Cognitive reorganization;Compensatory strategies;Language facilitation;Compensatory techniques;Cueing  hierarchy;Internal/external aids;Functional tasks;SLP instruction and feedback;Patient/family education;Environmental controls    Potential to Achieve Goals  Good    Consulted and Agree with Plan of Care  Patient       Patient will benefit from skilled therapeutic intervention in order to improve the following deficits and impairments:   Cognitive communication deficit    Problem List Patient Active Problem List   Diagnosis Date Noted  . Senile purpura ([place]) 11/27/2017  . CKD (chronic kidney disease) stage 4, GFR 15-29 ml/min (HCC) 09/26/2017  . 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 Diastolic dysfunction   . Coronary artery disease involving native coronary artery of native heart without angina pectoris   . Cardiac pacemaker in situ   . Atrial fibrillation with rapid ventricular response ([place])   . PVD (peripheral vascular disease) ([place])   . Lower extremity edema 04/21/2017  . Anxiety 03/22/2017  . Acute cholecystitis s/p lap cholecystectomy 03/05/2017 03/04/2017  . HOCM (hypertrophic obstructive cardiomyopathy) ([place]) 03/04/2017  . 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 Fatigue 01/27/2017  . Low vitamin B12 level 01/27/2017  . Syncope 07/24/2015  . 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 OSA on CPAP 10/26/2014  . Back pain 10/16/2014  . Seasonal and perennial allergic rhinitis 10/25/2013  . Hypertension associated with diabetes ([place]) 10/25/2013  . Hyperlipidemia associated with type 2 diabetes mellitus ([place]) 10/25/2013  . Morbid obesity ([place]) 10/25/2013   Deneise Lever, [place], CCC-SLP Speech-Language Pathologist   Aliene Altes 11/30/2017, 4:24 PM  [facility] [address] [place], [name], [address] Phone: [phone]   Fax:  [phone]   Name: BRENTLEY LANDFAIR MRN: [id number] Date of Birth: [birth year]

## 2017-12-04 ENCOUNTER — Encounter: Payer: Self-pay | Admitting: Family Medicine

## 2017-12-04 ENCOUNTER — Ambulatory Visit: Payer: Medicare Other | Admitting: Speech Pathology

## 2017-12-04 DIAGNOSIS — R2689 Other abnormalities of gait and mobility: Secondary | ICD-10-CM | POA: Diagnosis not present

## 2017-12-04 DIAGNOSIS — R41841 Cognitive communication deficit: Secondary | ICD-10-CM | POA: Diagnosis not present

## 2017-12-04 DIAGNOSIS — R29818 Other symptoms and signs involving the nervous system: Secondary | ICD-10-CM | POA: Diagnosis not present

## 2017-12-04 DIAGNOSIS — M6281 Muscle weakness (generalized): Secondary | ICD-10-CM | POA: Diagnosis not present

## 2017-12-04 DIAGNOSIS — I69315 Cognitive social or emotional deficit following cerebral infarction: Secondary | ICD-10-CM | POA: Diagnosis not present

## 2017-12-04 MED ORDER — CIPROFLOXACIN-HYDROCORTISONE 0.2-1 % OT SUSP: 3.0000 [drp] | Freq: Two times a day (BID) | OTIC | 0 refills | Status: DC

## 2017-12-04 NOTE — Therapy (Signed)
[facility] [address] [place], [name], [address] Phone: [phone]   Fax:  [phone]  Speech Language Pathology Treatment  Patient Details  Name: David [name] MRN: [id number] Date of Birth: 03-Mar-1941 Referring Provider: Dr. Naaman Plummer   Encounter Date: 12/04/2017  End of Session - 12/04/17 1807    Visit Number  8    Number of Visits  17    Date for SLP Re-Evaluation  12/22/17    SLP Start Time  1447    SLP Stop Time   1527    SLP Time Calculation (min)  40 min    Activity Tolerance  Patient tolerated treatment well       Past Medical History:  Diagnosis Date  . Acute cholecystitis s/p lap cholecystectomy 03/05/2017 03/04/2017  . Anemia, iron deficiency   . Anxiety   . Arthritis   . Atrial fibrillation with rapid ventricular response ([place])   . BPH (benign prostatic hypertrophy)   . CAD (coronary artery disease)    Nonobstructive CAD per cath  . Cardiac pacemaker in situ   . CHF (congestive heart failure) ([place])   . Chronic ulcer of right foot ([place])   . CKD (chronic kidney disease) stage 4, GFR 15-29 ml/min (HCC) 09/26/2017  . CKD (chronic kidney disease), stage III ([place]) secondary to DM and HTN   nephrologist-  [name]  . Coronary artery disease involving native coronary artery of native heart without angina pectoris   . Diastolic dysfunction   . Dyspnea   . 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 History of cellulitis    right great toe 10-25-2014  . History of skin cancer   . HOCM (hypertrophic obstructive cardiomyopathy) ([place])   . Hyperlipidemia associated with type 2 diabetes mellitus ([place]) 10/25/2013  . Hypertension   . Hypertension associated with diabetes ([place]) 10/25/2013  . 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 Insulin dependent type 2 diabetes mellitus ([place]) 1991   followd by [name]--  has insulin pump  .  Insulin pump in place   . OSA on CPAP   . 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 Peripheral neuropathy    severe  . Peripheral vascular disease ([place])    bilateral lower extremities  . PVD (peripheral vascular disease) ([place])   . Rib fracture 07/24/2015  . Seasonal and perennial allergic rhinitis 10/25/2013  . Secondary hyperparathyroidism of renal origin ([place])   . 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 Sinus node dysfunction (HCC)   . Sleep apnea   . Syncope 07/24/2015    Past Surgical History:  Procedure Laterality Date  . AMPUTATION OF REPLICATED TOES  Mar 1601   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)  . AMPUTATION TOE Right 03/12/2015   Procedure: RIGHT HALLUS AMPUTATION ;  Surgeon: [name], MD;  Location: [facility];  Service: Podiatry;  Laterality: Right;  . CARDIAC CATHETERIZATION  11-25-2010   [place], [place]   Nonobstructive CAD  . CARDIAC PACEMAKER PLACEMENT  Nov 2009   [employer]  . CHOLECYSTECTOMY N/A 03/05/2017   Procedure: LAPAROSCOPIC CHOLECYSTECTOMY WITH INTRAOPERATIVE CHOLANGIOGRAM;  Surgeon: [name], MD;  [facility];  Service: General;  Laterality: N/A;  . EP IMPLANTABLE DEVICE N/A 06/03/2015   Procedure: PPM Generator Changeout;  Surgeon: [name], MD;  Location: [facility];  Service: Cardiovascular;  Laterality: N/A;  . EXCISION BONE CYST Right 03/06/2015   Procedure: BONE BIOPSIES  OF RIGHT FOOT;  Surgeon: [name], MD;  Location: [facility];  Service: Podiatry;  Laterality: Right;  . ORIF ANKLE FRACTURE Left 11/06/2014   Procedure: OPEN REDUCTION INTERNAL FIXATION (ORIF) LEFT  ANKLE FRACTURE;  Surgeon: [name], MD;  Location: [facility];  Service: Orthopedics;  Laterality: Left;  . TEE WITHOUT CARDIOVERSION N/A 09/01/2017   Procedure: TRANSESOPHAGEAL ECHOCARDIOGRAM (TEE);  Surgeon: [name], MD;  Location: [facility];  Service: Cardiovascular;  Laterality: N/A;  . TOTAL KNEE ARTHROPLASTY    . VEIN  LIGATION AND STRIPPING      There were no vitals filed for this visit.  Subjective Assessment - 12/04/17 1455    Subjective  Pt arrives with homework partially completed    Currently in Pain?  No/denies            ADULT SLP TREATMENT - 12/04/17 1447      General Information   Behavior/Cognition  Alert;Cooperative;Pleasant mood      Treatment Provided   Treatment provided  Cognitive-Linquistic      Cognitive-Linquistic Treatment   Treatment focused on  Cognition    Skilled Treatment  Pt reported using strategies for medication management at home, with family checking for accuracy when filling pill box. SLP targeted selective attention with mod complex written instructions; pt maintained selective attention for 10 minutes, accuracy 90% with pt ID'ing and correcting error. Homework partially completed; errors ID'd with min-mod A required. Pt instructed to complete and review for errors at home. SLP targeted alternating attention with mod complex problem solving task; pt continues to require min-mod A to ID errors, several made in transposing numbers from his rough draft to answer sheet.      Assessment / Recommendations / Plan   Plan  Continue with current plan of care      Progression Toward Goals   Progression toward goals  Progressing toward goals         SLP Short Term Goals - 12/04/17 1456      SLP SHORT TERM GOAL #1   Title  Pt will selectively attend to mod complex cognitive linguistic task for 10 minutes with distractions and occasional min redirection over 3 sessions    Baseline  12/04/17    Time  1    Period  Weeks    Status  On-going      SLP SHORT TERM GOAL #2   Title  Pt will complete mod complex organization, reasoning, problem solving tasks with 85% accuracy and occasional min A over 3 sessions    Time  1    Period  Weeks    Status  On-going      SLP SHORT TERM GOAL #3   Title  Pt will ID errors and self correct 3/5 errors with occasional min A over 3  sessions    Time  1    Period  Weeks    Status  On-going      SLP SHORT TERM GOAL #4   Title  Pt will utilize external aids for medication, schedule management with occasional min A from family outside of therapy over 3 sessions    Baseline  (reported) 11-15-17, (reported) 11-21-17, (reported) 12/04/17    Time  1    Period  Weeks    Status  Achieved       SLP Long Term Goals - 12/04/17 1459      SLP LONG TERM GOAL #1   Title  Pt will alternate attention  between 2 mod complex cognitive linguistic tasks for 15 minutes with 85% on each and rare min A over 3 sessions    Time  5    Period  Weeks    Status  On-going      SLP LONG TERM GOAL #2   Title  Pt will self correct errors 4/5 errors with occasional min A over 3 sessions    Time  5    Period  Weeks    Status  On-going      SLP LONG TERM GOAL #3   Title  Pt will utilize external aids for medication, schedule management with rare min A from family outside of therapy over 5 sessions    Time  5    Period  Weeks    Status  On-going       Plan - 12/04/17 1808    Clinical Impression Statement  David [name] presents with cognitive impairments seen most acutely today in problem solving, planning, and intellectual and emergent awareness. Pt beginning to demo some intellectual awareness to cog-linguistic deficits. Cues again required for problem solving and to check for errors. See "skilled treatment" for more details. I recommend cont'd skilled ST to address cognitive communication impairments in order to increase independence, decrease caregiver burden and improve quality of life.     Speech Therapy Frequency  2x / week    Treatment/Interventions  Cognitive reorganization;Compensatory strategies;Language facilitation;Compensatory techniques;Cueing hierarchy;Internal/external aids;Functional tasks;SLP instruction and feedback;Patient/family education;Environmental controls    Potential to Achieve Goals  Good    Consulted and Agree with Plan  of Care  Patient       Patient will benefit from skilled therapeutic intervention in order to improve the following deficits and impairments:   Cognitive communication deficit    Problem List Patient Active Problem List   Diagnosis Date Noted  . Senile purpura ([place]) 11/27/2017  . CKD (chronic kidney disease) stage 4, GFR 15-29 ml/min (HCC) 09/26/2017  . 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 Diastolic dysfunction   . Coronary artery disease involving native coronary artery of native heart without angina pectoris   . Cardiac pacemaker in situ   . Atrial fibrillation with rapid ventricular response ([place])   . PVD (peripheral vascular disease) ([place])   . Lower extremity edema 04/21/2017  . Anxiety 03/22/2017  . Acute cholecystitis s/p lap cholecystectomy 03/05/2017 03/04/2017  . HOCM (hypertrophic obstructive cardiomyopathy) ([place]) 03/04/2017  . 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 Fatigue 01/27/2017  . Low vitamin B12 level 01/27/2017  . Syncope 07/24/2015  . 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 OSA on CPAP 10/26/2014  . Back pain 10/16/2014  . Seasonal and perennial allergic rhinitis 10/25/2013  . Hypertension associated with diabetes ([place]) 10/25/2013  . Hyperlipidemia associated with type 2 diabetes mellitus ([place]) 10/25/2013  . Morbid obesity ([place]) 10/25/2013   Deneise Lever, [place], [place] 12/04/2017, 6:09 PM  [facility] [address] [place], [name], [address] Phone: [phone]   Fax:  [phone]   Name: David [name] MRN: [id number] Date of Birth: 1940/07/09

## 2017-12-05 ENCOUNTER — Telehealth: Payer: Self-pay

## 2017-12-05 NOTE — Telephone Encounter (Signed)
PA for Cipro drops has been initiated through cover my meds.  Waiting for insurance decision.

## 2017-12-06 NOTE — Progress Notes (Signed)
[facility] Report   Patient Details  Name: David [name] MRN: [id number] Date of Birth: 1941/01/13 Age: 77 y.o. PCP: [name], MD  Vitals:   12/05/17 0949  BP: (!) 140/58  Pulse: 67  Resp: 18  SpO2: 95%  Weight: 271 lb 12.8 oz (123.3 kg)     [facility] Eval - 12/06/17 0900      Referral    Referring Provider  Out pt P.T.    Reason for referral  Obesitity/Overweight;High Cholesterol;Hypertension;Inactivity;Stroke;Orthopedic;Diabetes    Program Start Date  12/06/17      Measurement   Neck measurement  21 Inches    Waist Circumference  49.5 inches    Body fat  35.1 percent      Information for Trainer   Goals  "to walk 4-5 blocks w/o stopping"    Current Exercise  none    Orthopedic Concerns  B knee pain L>R, neuropathy,     Pertinent Medical History  see chart    Current Barriers  "shob r/t heart"    Restrictions/Precautions  Diabetic snack before exercise;Fall risk;Assistive device    Medications that affect exercise  Beta blocker      Timed Up and Go (TUGS)   Timed Up and Go  High risk >13 seconds 13.05   13.05     Mobility and Daily Activities   I find it easy to walk up or down two or more flights of stairs.  1    I have no trouble taking out the trash.  2    I do housework such as vacuuming and dusting on my own without difficulty.  4    I can easily lift a gallon of milk (8lbs).  4    I can easily walk a mile.  1    I have no trouble reaching into high cupboards or reaching down to pick up something from the floor.  4    I do not have trouble doing out-door work such as Armed forces logistics/support/administrative officer, raking leaves, or gardening.  4      Mobility and Daily Activities   I feel younger than my age.  2    I feel independent.  4    I feel energetic.  2    I live an active life.   2    I feel strong.  2    I feel healthy.  3    I feel active as other people my age.  3      How fit and strong are you.   Fit and Strong Total Score  38      Past  Medical History:  Diagnosis Date  . Acute cholecystitis s/p lap cholecystectomy 03/05/2017 03/04/2017  . Anemia, iron deficiency   . Anxiety   . Arthritis   . Atrial fibrillation with rapid ventricular response ([place])   . BPH (benign prostatic hypertrophy)   . CAD (coronary artery disease)    Nonobstructive CAD per cath  . Cardiac pacemaker in situ   . CHF (congestive heart failure) ([place])   . Chronic ulcer of right foot ([place])   . CKD (chronic kidney disease) stage 4, GFR 15-29 ml/min (HCC) 09/26/2017  . CKD (chronic kidney disease), stage III ([place]) secondary to DM and HTN   nephrologist-  [name]  . Coronary artery disease involving native coronary artery of native heart without angina pectoris   . Diastolic dysfunction   . Dyspnea   . 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 History of cellulitis    right great toe 10-25-2014  . History of skin cancer   . HOCM (hypertrophic obstructive cardiomyopathy) ([place])   . Hyperlipidemia associated with type 2 diabetes mellitus ([place]) 10/25/2013  . Hypertension   . Hypertension associated with diabetes ([place]) 10/25/2013  . 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 Insulin dependent type 2 diabetes mellitus ([place]) 1991   followd by [name]--  has insulin pump  . Insulin pump in place   . OSA on CPAP   . 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 Peripheral neuropathy    severe  . Peripheral vascular disease ([place])    bilateral lower extremities  . PVD (peripheral vascular disease) ([place])   . Rib fracture 07/24/2015  . Seasonal and perennial allergic rhinitis 10/25/2013  . Secondary hyperparathyroidism of renal origin ([place])   . 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 Sinus node dysfunction (HCC)   . Sleep apnea   . Syncope 07/24/2015   Past Surgical History:  Procedure Laterality Date  . AMPUTATION OF REPLICATED TOES  Mar 2841   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)  . AMPUTATION TOE Right  03/12/2015   Procedure: RIGHT HALLUS AMPUTATION ;  Surgeon: [name], MD;  Location: [facility];  Service: Podiatry;  Laterality: Right;  . CARDIAC CATHETERIZATION  11-25-2010   [place], [place]   Nonobstructive CAD  . CARDIAC PACEMAKER PLACEMENT  Nov 2009   [employer]  . CHOLECYSTECTOMY N/A 03/05/2017   Procedure: LAPAROSCOPIC CHOLECYSTECTOMY WITH INTRAOPERATIVE CHOLANGIOGRAM;  Surgeon: [name], MD;  [facility];  Service: General;  Laterality: N/A;  . EP IMPLANTABLE DEVICE N/A 06/03/2015   Procedure: PPM Generator Changeout;  Surgeon: [name], MD;  Location: [facility];  Service: Cardiovascular;  Laterality: N/A;  . EXCISION BONE CYST Right 03/06/2015   Procedure: BONE BIOPSIES OF RIGHT FOOT;  Surgeon: [name], MD;  Location: [facility];  Service: Podiatry;  Laterality: Right;  . ORIF ANKLE FRACTURE Left 11/06/2014   Procedure: OPEN REDUCTION INTERNAL FIXATION (ORIF) LEFT  ANKLE FRACTURE;  Surgeon: [name], MD;  Location: [facility];  Service: Orthopedics;  Laterality: Left;  . TEE WITHOUT CARDIOVERSION N/A 09/01/2017   Procedure: TRANSESOPHAGEAL ECHOCARDIOGRAM (TEE);  Surgeon: [name], MD;  Location: [facility];  Service: Cardiovascular;  Laterality: N/A;  . TOTAL KNEE ARTHROPLASTY    . VEIN LIGATION AND STRIPPING     Social History   Tobacco Use  Smoking Status Former Smoker  . Packs/day: 2.00  . Years: 30.00  . Pack years: 60.00  . Last attempt to quit: 03/03/1984  . Years since quitting: 33.7  Smokeless Tobacco Never Used     David [name] will be doing the PREP at [facility] on Wed/Fri 11-noon x 12 weeks starting today.   [name] 12/06/2017, 9:53 AM

## 2017-12-06 NOTE — Progress Notes (Signed)
Cardiology Office Note Date:  12/07/2017  Patient ID:  David [name], David [name] [birth year], MRN [id number] PCP:  [name], MD  Cardiologist:  Dr. Caryl Comes Endocrinology: Dr. Dwyane Dee Nephrology: Dr. Arty Baumgartner    Chief Complaint: post hospital visit  History of Present Illness: David [name] is a 77 y.o. male with history of sinus node dysfunction w/PPM,  HCM, chronic CHF, CKD (III), OSA w/CPAP, HTN, DM, secondary hyperparathyroidism.  He comes in today to be seen for Dr. Caryl Comes.  He last saw him in Dec 2018, at that visit discussed he was being evaluated by Dr. Joseph/genetics 2/2 to his HCM.  He was felt to have some degree of fluid OL, presumably with recs of 1 week of increased diuretic dosing and discussion on CHF management.  More recently in April this year he was hospitalized with a stroke.  During his stay he had TEE without PFO or thrombus.  During his stay he had a syncopal event associated with no pulse, had 76min of CPR, subsequently his PPM was interrogated, showed "No AFib" by notes, and presumably intact function, syncope was attributed to hypotension, he discharged to CIR and discharged to home 09/12/17 on Plavix along with his other meds.  His ACE stopped 2/2 renal function.  He comes today accompanied by his wife.  [facility] completed PT[facility]  He remains with some balance difficulties and ambulates with a cane for stability and continues to work on memory, but all-in-all made good recovery post stroke.  He has not had any CP, palpitations, no recurrent syncope (since the above noted event).  He does mention that when first home after taking his morning medicines about an hour later for about an hour felt a little lightheaded, but this has seemed to subside and resolve.    Device information: MDT dual chamber PPM, implanted 2009, gen change 2017   Past Medical History:  Diagnosis Date  . Acute cholecystitis s/p lap cholecystectomy  03/05/2017 03/04/2017  . Anemia, iron deficiency   . Anxiety   . Arthritis   . Atrial fibrillation with rapid ventricular response ([place])   . BPH (benign prostatic hypertrophy)   . CAD (coronary artery disease)    Nonobstructive CAD per cath  . Cardiac pacemaker in situ   . CHF (congestive heart failure) ([place])   . Chronic ulcer of right foot ([place])   . CKD (chronic kidney disease) stage 4, GFR 15-29 ml/min (HCC) 09/26/2017  . CKD (chronic kidney disease), stage III ([place]) secondary to DM and HTN   nephrologist-  [name]  . Coronary artery disease involving native coronary artery of native heart without angina pectoris   . Diastolic dysfunction   . Dyspnea   . 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 ([place]) 09/04/2017  . History of cellulitis    right great toe 10-25-2014  . History of skin cancer   . HOCM (hypertrophic obstructive cardiomyopathy) ([place])   . Hyperlipidemia associated with type 2 diabetes mellitus ([place]) 10/25/2013  . Hypertension   . Hypertension associated with diabetes ([place]) 10/25/2013  . 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 Insulin dependent type 2 diabetes mellitus ([place]) 1991   followd by [name]--  has insulin pump  . Insulin pump in place   . OSA on CPAP   . 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 Peripheral neuropathy    severe  . Peripheral vascular disease ([place])  bilateral lower extremities  . PVD (peripheral vascular disease) ([place])   . Rib fracture 07/24/2015  . Seasonal and perennial allergic rhinitis 10/25/2013  . Secondary hyperparathyroidism of renal origin ([place])   . 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 Sinus node dysfunction (HCC)   . Sleep apnea   . Syncope 07/24/2015    Past Surgical History:  Procedure Laterality Date  . AMPUTATION OF REPLICATED TOES  Mar 6301   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)  . AMPUTATION TOE Right 03/12/2015   Procedure: RIGHT HALLUS AMPUTATION ;  Surgeon: [name], MD;   Location: [facility];  Service: Podiatry;  Laterality: Right;  . CARDIAC CATHETERIZATION  11-25-2010   [place], [place]   Nonobstructive CAD  . CARDIAC PACEMAKER PLACEMENT  Nov 2009   [employer]  . CHOLECYSTECTOMY N/A 03/05/2017   Procedure: LAPAROSCOPIC CHOLECYSTECTOMY WITH INTRAOPERATIVE CHOLANGIOGRAM;  Surgeon: [name], MD;  [facility];  Service: General;  Laterality: N/A;  . EP IMPLANTABLE DEVICE N/A 06/03/2015   Procedure: PPM Generator Changeout;  Surgeon: [name], MD;  Location: [facility];  Service: Cardiovascular;  Laterality: N/A;  . EXCISION BONE CYST Right 03/06/2015   Procedure: BONE BIOPSIES OF RIGHT FOOT;  Surgeon: [name], MD;  Location: [facility];  Service: Podiatry;  Laterality: Right;  . ORIF ANKLE FRACTURE Left 11/06/2014   Procedure: OPEN REDUCTION INTERNAL FIXATION (ORIF) LEFT  ANKLE FRACTURE;  Surgeon: [name], MD;  Location: [facility];  Service: Orthopedics;  Laterality: Left;  . TEE WITHOUT CARDIOVERSION N/A 09/01/2017   Procedure: TRANSESOPHAGEAL ECHOCARDIOGRAM (TEE);  Surgeon: [name], MD;  Location: [facility] [facility];  Service: Cardiovascular;  Laterality: N/A;  . TOTAL KNEE ARTHROPLASTY    . VEIN LIGATION AND STRIPPING      Current Outpatient Medications  Medication Sig Dispense Refill  . acetaminophen (TYLENOL) 325 MG tablet Take 1-2 tablets (325-650 mg total) by mouth every 4 (four) hours as needed for mild pain.    Marland Kitchen acetic acid 2 % otic solution INSTILL 4 DROPS INTO RIGHT EAR 4 TIMES DAILY FOR 7 DAYS  0  . atorvastatin (LIPITOR) 10 MG tablet Take 1 tablet (10 mg total) by mouth daily at 6 PM. 90 tablet 0  . calcitRIOL (ROCALTROL) 0.25 MCG capsule Take 1 capsule (0.25 mcg total) by mouth daily. 90 capsule 1  . carvedilol (COREG) 25 MG tablet Take 1 tablet (25 mg total) by mouth 2 (two) times daily with a meal. 60 tablet 0  . cholecalciferol (VITAMIN D) 1000 units tablet Take 1  tablet (1,000 Units total) by mouth daily. 30 tablet 1  . ciprofloxacin-hydrocortisone (CIPRO HC) OTIC suspension Place 3 drops into both ears 2 (two) times daily. 10 mL 0  . clopidogrel (PLAVIX) 75 MG tablet Take 1 tablet (75 mg total) by mouth daily. 30 tablet 0  . Continuous Blood Gluc Receiver (FREESTYLE LIBRE 14 DAY READER) DEVI 1 each by Does not apply route daily. USE TO CHECK BLOOD SUGAR 1 Device 0  . Continuous Blood Gluc Sensor (FREESTYLE LIBRE 14 DAY SENSOR) MISC 1 each by Does not apply route daily. APPLY 1 SENSOR TO BODY ONCE EVERY 14 DAYS TO CHECK BLOOD SUGAR. 9 each 3  . hydrOXYzine (ATARAX/VISTARIL) 50 MG tablet Take 1 tablet (50 mg total) 3 (three) times daily as needed by mouth. 30 tablet 1  . insulin regular human CONCENTRATED (HUMULIN R) 500 UNIT/ML injection Inject into the skin.    Marland Kitchen insulin  regular human CONCENTRATED (HUMULIN R) 500 UNIT/ML injection Use max 120 units daily or as directed in insulin pump, Dx code E11.65 20 mL 1  . isosorbide mononitrate (IMDUR) 30 MG 24 hr tablet Take 1 tablet (30 mg total) by mouth daily. 30 tablet 0  . liraglutide (VICTOZA) 18 MG/3ML SOPN Inject 0.2 mLs (1.2 mg total) into the skin daily. 2 pen 3  . pantoprazole (PROTONIX) 40 MG tablet TAKE ONE TABLET BY MOUTH ONCE DAILY 90 tablet 1  . sertraline (ZOLOFT) 100 MG tablet Take 100 mg by mouth daily.    Marland Kitchen torsemide (DEMADEX) 10 MG tablet Take 1 tablet (10 mg total) by mouth 2 (two) times daily. 180 tablet 1  . vitamin B-12 (CYANOCOBALAMIN) 100 MCG tablet Take 100 mcg by mouth daily.    . diclofenac sodium (VOLTAREN) 1 % GEL Apply 2 g topically 3 (three) times daily. (Patient not taking: Reported on 12/07/2017) 4 Tube 0  . lisinopril (PRINIVIL,ZESTRIL) 5 MG tablet Take 1 tablet by mouth daily.  6   No current facility-administered medications for this visit.     Allergies:   Adhesive [tape]   Social History:  The patient  reports that he quit smoking about 33 years ago. He has a 60.00  pack-year smoking history. He has never used smokeless tobacco. He reports that he drinks about 0.6 oz of alcohol per week. He reports that he does not use drugs.   Family History:  The patient's family history includes Cancer in his mother; Heart attack in his father; Stroke in his father.  ROS:  Please see the history of present illness.  All other systems are reviewed and otherwise negative.   PHYSICAL EXAM:  VS:  BP (!) 106/52   Pulse 78   Ht 6\' 4"  (1.93 m)   Wt 272 lb 12.8 oz (123.7 kg)   SpO2 97%   BMI 33.21 kg/m  BMI: Body mass index is 33.21 kg/m. Well nourished, well developed, in no acute distress  HEENT: normocephalic, atraumatic  Neck: no JVD, carotid bruits or masses Cardiac:  RRR; no significant murmurs, no rubs, or gallops Lungs:  CTA b/l, no wheezing, rhonchi or rales  Abd: soft, nontender MS: no deformity or atrophy Ext: trace edema, chronic looking skin changes Skin: warm and dry, no rash Neuro:  No gross deficits appreciated Psych: euthymic mood, full affect  PPM site is stable, no tethering or discomfort   EKG:  Not done today PPM interrogation done today and reviewed by myself: battery and lead measuremnents are stable from previous, interrogate all notes no events, he has not had any atrial or V arrhythmias, presents AP/VS, he AP 99.7%, VP <0.1%  08/31/17: TTE Study Conclusions - Left ventricle: The cavity size was normal. There was moderate   concentric hypertrophy. Systolic function was normal. The   estimated ejection fraction was in the range of 60% to 65%.   Doppler parameters are consistent with abnormal left ventricular   relaxation (grade 1 diastolic dysfunction). - Left atrium: The atrium was moderately dilated. - Pericardium, extracardiac: A trivial pericardial effusion was   identified.  Recent Labs: 01/27/2017: TSH 1.82 09/03/2017: Magnesium 2.0 09/05/2017: ALT 19 09/11/2017: Hemoglobin 10.8; Platelets 161 10/24/2017: BUN 45; Creatinine,  Ser 2.17; Potassium 5.1; Sodium 137  07/21/2017: Direct LDL 76.0 08/31/2017: Cholesterol 165; HDL 20; LDL Cholesterol 106; Total CHOL/HDL Ratio 8.3; Triglycerides 195; VLDL 39   CrCl cannot be calculated (Patient's most recent lab result is older than the maximum 21  days allowed.).   Wt Readings from Last 3 Encounters:  12/07/17 272 lb 12.8 oz (123.7 kg)  12/05/17 271 lb 12.8 oz (123.3 kg)  11/27/17 276 lb 3.2 oz (125.3 kg)     Other studies reviewed: Additional studies/records reviewed today include: summarized above  ASSESSMENT AND PLAN:  1. PPM     Stable function, no changes made  2. HTN     Looks OK, no changes  3. HCM     In review of Dr. Delene Loll note, he did not harbor a pathogenic variant in genes implicated in HCM     Most recent echo noted above from April     He has trace edema, this is reported as chronic for years and unchanged, weight is down 4 lbs from last  4. Recent stroke     No arrhythmias noted on pacer interrogation    Disposition: F/u with every 3 mo remotes, in-clinic 1 year, sooner if needed.  He has seen his PMD already post hospital follows closely as well with endo and nephrology.    Current medicines are reviewed at length with the patient today.  The patient did not have any concerns regarding medicines.  Venetia Night, PA-C 12/07/2017 11:58 AM     [facility] [place] [address] [phone] (office)  [phone] (fax)

## 2017-12-07 ENCOUNTER — Encounter: Payer: Self-pay | Admitting: Physician Assistant

## 2017-12-07 ENCOUNTER — Ambulatory Visit (INDEPENDENT_AMBULATORY_CARE_PROVIDER_SITE_OTHER): Payer: Medicare Other | Admitting: Physician Assistant

## 2017-12-07 ENCOUNTER — Ambulatory Visit: Payer: Medicare Other | Admitting: Speech Pathology

## 2017-12-07 VITALS — BP 106/52 | HR 78 | Ht 76.0 in | Wt 272.8 lb

## 2017-12-07 DIAGNOSIS — I69315 Cognitive social or emotional deficit following cerebral infarction: Secondary | ICD-10-CM | POA: Diagnosis not present

## 2017-12-07 DIAGNOSIS — Z950 Presence of cardiac pacemaker: Secondary | ICD-10-CM

## 2017-12-07 DIAGNOSIS — I639 Cerebral infarction, unspecified: Secondary | ICD-10-CM | POA: Diagnosis not present

## 2017-12-07 DIAGNOSIS — R29818 Other symptoms and signs involving the nervous system: Secondary | ICD-10-CM | POA: Diagnosis not present

## 2017-12-07 DIAGNOSIS — I421 Obstructive hypertrophic cardiomyopathy: Secondary | ICD-10-CM

## 2017-12-07 DIAGNOSIS — I10 Essential (primary) hypertension: Secondary | ICD-10-CM

## 2017-12-07 DIAGNOSIS — R2689 Other abnormalities of gait and mobility: Secondary | ICD-10-CM | POA: Diagnosis not present

## 2017-12-07 DIAGNOSIS — R41841 Cognitive communication deficit: Secondary | ICD-10-CM

## 2017-12-07 DIAGNOSIS — M6281 Muscle weakness (generalized): Secondary | ICD-10-CM | POA: Diagnosis not present

## 2017-12-07 NOTE — Patient Instructions (Signed)
Medication Instructions:  Your physician recommends that you continue on your current medications as directed. Please refer to the Current Medication list given to you today.  *If you need a refill on your cardiac medications before your next appointment, please call your pharmacy*  Labwork: None ordered  Testing/Procedures: None ordered  Follow-Up: Remote monitoring is used to monitor your Pacemaker or ICD from home. This monitoring reduces the number of office visits required to check your device to one time per year. It allows [name] to keep an eye on the functioning of your device to ensure it is working properly. You are scheduled for a device check from home on 02/12/2018. You may send your transmission at any time that day. If you have a wireless device, the transmission will be sent automatically. After your physician reviews your transmission, you will receive a postcard with your next transmission date.  Your physician wants you to follow-up in: 1 year with Dr. Caryl Comes or Tommye Standard, PA.  You will receive a reminder letter in the mail two months in advance. If you don't receive a letter, please call our office to schedule the follow-up appointment.  Thank you for choosing [facility]!!

## 2017-12-07 NOTE — Therapy (Signed)
[facility] [address] [place], [name], [address] Phone: [phone]   Fax:  [phone]  Speech Language Pathology Treatment  Patient Details  Name: David [name] MRN: [id number] Date of Birth: Aug 10, 1940 Referring Provider: Dr. Naaman Plummer   Encounter Date: 12/07/2017  End of Session - 12/07/17 1611    Visit Number  9    Number of Visits  17    Date for SLP Re-Evaluation  12/22/17    SLP Start Time  1447    SLP Stop Time   1533    SLP Time Calculation (min)  46 min    Activity Tolerance  Patient tolerated treatment well       Past Medical History:  Diagnosis Date  . Acute cholecystitis s/p lap cholecystectomy 03/05/2017 03/04/2017  . Anemia, iron deficiency   . Anxiety   . Arthritis   . Atrial fibrillation with rapid ventricular response ([place])   . BPH (benign prostatic hypertrophy)   . CAD (coronary artery disease)    Nonobstructive CAD per cath  . Cardiac pacemaker in situ   . CHF (congestive heart failure) ([place])   . Chronic ulcer of right foot ([place])   . CKD (chronic kidney disease) stage 4, GFR 15-29 ml/min (HCC) 09/26/2017  . CKD (chronic kidney disease), stage III ([place]) secondary to DM and HTN   nephrologist-  [name]  . Coronary artery disease involving native coronary artery of native heart without angina pectoris   . Diastolic dysfunction   . Dyspnea   . 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 History of cellulitis    right great toe 10-25-2014  . History of skin cancer   . HOCM (hypertrophic obstructive cardiomyopathy) ([place])   . Hyperlipidemia associated with type 2 diabetes mellitus ([place]) 10/25/2013  . Hypertension   . Hypertension associated with diabetes ([place]) 10/25/2013  . 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 Insulin dependent type 2 diabetes mellitus ([place]) 1991   followd by [name]--  has insulin pump  .  Insulin pump in place   . OSA on CPAP   . 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 Peripheral neuropathy    severe  . Peripheral vascular disease ([place])    bilateral lower extremities  . PVD (peripheral vascular disease) ([place])   . Rib fracture 07/24/2015  . Seasonal and perennial allergic rhinitis 10/25/2013  . Secondary hyperparathyroidism of renal origin ([place])   . 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 Sinus node dysfunction (HCC)   . Sleep apnea   . Syncope 07/24/2015    Past Surgical History:  Procedure Laterality Date  . AMPUTATION OF REPLICATED TOES  Mar 0973   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)  . AMPUTATION TOE Right 03/12/2015   Procedure: RIGHT HALLUS AMPUTATION ;  Surgeon: [name], MD;  Location: [facility];  Service: Podiatry;  Laterality: Right;  . CARDIAC CATHETERIZATION  11-25-2010   [place], [place]   Nonobstructive CAD  . CARDIAC PACEMAKER PLACEMENT  Nov 2009   [employer]  . CHOLECYSTECTOMY N/A 03/05/2017   Procedure: LAPAROSCOPIC CHOLECYSTECTOMY WITH INTRAOPERATIVE CHOLANGIOGRAM;  Surgeon: [name], MD;  [facility];  Service: General;  Laterality: N/A;  . EP IMPLANTABLE DEVICE N/A 06/03/2015   Procedure: PPM Generator Changeout;  Surgeon: [name], MD;  Location: [facility];  Service: Cardiovascular;  Laterality: N/A;  . EXCISION BONE CYST Right 03/06/2015   Procedure: BONE BIOPSIES  OF RIGHT FOOT;  Surgeon: [name], MD;  Location: [facility];  Service: Podiatry;  Laterality: Right;  . ORIF ANKLE FRACTURE Left 11/06/2014   Procedure: OPEN REDUCTION INTERNAL FIXATION (ORIF) LEFT  ANKLE FRACTURE;  Surgeon: [name], MD;  Location: [facility];  Service: Orthopedics;  Laterality: Left;  . TEE WITHOUT CARDIOVERSION N/A 09/01/2017   Procedure: TRANSESOPHAGEAL ECHOCARDIOGRAM (TEE);  Surgeon: [name], MD;  Location: [facility];  Service: Cardiovascular;  Laterality: N/A;  . TOTAL KNEE ARTHROPLASTY    . VEIN  LIGATION AND STRIPPING      There were no vitals filed for this visit.  Subjective Assessment - 12/07/17 1456    Subjective  "I checked everything and it was right." pt arrives without some of worksheets assigned for homework.     Currently in Pain?  Yes    Pain Score  5     Pain Location  Knee    Pain Orientation  Right;Left    Pain Descriptors / Indicators  Aching    Pain Type  Chronic pain    Pain Onset  More than a month ago    Aggravating Factors   exercise    Pain Relieving Factors  exercise            ADULT SLP TREATMENT - 12/07/17 1450      General Information   Behavior/Cognition  Alert;Cooperative;Pleasant mood      Treatment Provided   Treatment provided  Cognitive-Linquistic      Pain Assessment   Pain Assessment  No/denies pain      Cognitive-Linquistic Treatment   Treatment focused on  Cognition    Skilled Treatment   SLP targeted reasoning/problem solving in mod complex task; usual mod A required. Pt with external distraction x1, internal distraction x2, reported "It's just not clicking." Requesting additional reasoning puzzles for home; SLP questioned patient re: whether he feels similarly overwhelmed with any activities at home; pt was unable to think of any examples. Encouraged pt to bring his wife to next session for additional insight on opportunities to address reasoning/problem solving in a functional manner. Pt continues to require frequent cues to use strategies for attention.       Assessment / Recommendations / Plan   Plan  Continue with current plan of care      Progression Toward Goals   Progression toward goals  Progressing toward goals       SLP Education - 12/07/17 1611    Education provided  Yes    Education Details  bring wife to next session     Person(s) Educated  Patient    Methods  Explanation    Comprehension  Verbalized understanding       SLP Short Term Goals - 12/07/17 1612      SLP SHORT TERM GOAL #1   Title  Pt will  selectively attend to mod complex cognitive linguistic task for 10 minutes with distractions and occasional min redirection over 3 sessions    Baseline  12/04/17    Status  Partially Met      SLP SHORT TERM GOAL #2   Title  Pt will complete mod complex organization, reasoning, problem solving tasks with 85% accuracy and occasional min A over 3 sessions    Time  1    Status  Not Met      SLP SHORT TERM GOAL #3   Title  Pt will ID errors and self correct 3/5 errors with  occasional min A over 3 sessions    Time  1    Period  Weeks    Status  Not Met      SLP SHORT TERM GOAL #4   Title  Pt will utilize external aids for medication, schedule management with occasional min A from family outside of therapy over 3 sessions    Status  Achieved       SLP Long Term Goals - 12/07/17 1613      SLP LONG TERM GOAL #1   Title  Pt will alternate attention between 2 mod complex cognitive linguistic tasks for 15 minutes with 85% on each and rare min A over 3 sessions    Time  4    Status  On-going      SLP LONG TERM GOAL #2   Title  Pt will self correct errors 4/5 errors with occasional min A over 3 sessions    Time  4    Period  Weeks    Status  On-going      SLP LONG TERM GOAL #3   Title  Pt will utilize external aids for medication, schedule management with rare min A from family outside of therapy over 5 sessions    Time  4    Period  Weeks    Status  On-going       Plan - 12/07/17 1612    Clinical Impression Statement  David [name] presents with cognitive impairments seen most acutely today in problem solving, planning, and intellectual and emergent awareness. Pt beginning to demo some intellectual awareness to cog-linguistic deficits. Cues again required for problem solving and to check for errors. See "skilled treatment" for more details. I recommend cont'd skilled ST to address cognitive communication impairments in order to increase independence, decrease caregiver burden and improve  quality of life.     Speech Therapy Frequency  2x / week    Treatment/Interventions  Cognitive reorganization;Compensatory strategies;Language facilitation;Compensatory techniques;Cueing hierarchy;Internal/external aids;Functional tasks;SLP instruction and feedback;Patient/family education;Environmental controls    Potential to Achieve Goals  Good    Consulted and Agree with Plan of Care  Patient       Patient will benefit from skilled therapeutic intervention in order to improve the following deficits and impairments:   Cognitive communication deficit    Problem List Patient Active Problem List   Diagnosis Date Noted  . Senile purpura ([place]) 11/27/2017  . CKD (chronic kidney disease) stage 4, GFR 15-29 ml/min (HCC) 09/26/2017  . 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 Diastolic dysfunction   . Coronary artery disease involving native coronary artery of native heart without angina pectoris   . Cardiac pacemaker in situ   . Atrial fibrillation with rapid ventricular response ([place])   . PVD (peripheral vascular disease) ([place])   . Lower extremity edema 04/21/2017  . Anxiety 03/22/2017  . Acute cholecystitis s/p lap cholecystectomy 03/05/2017 03/04/2017  . HOCM (hypertrophic obstructive cardiomyopathy) ([place]) 03/04/2017  . 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 Fatigue 01/27/2017  . Low vitamin B12 level 01/27/2017  . Syncope 07/24/2015  . 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 OSA on CPAP 10/26/2014  . Back pain 10/16/2014  . Seasonal and perennial allergic rhinitis 10/25/2013  . Hypertension associated with diabetes ([place]) 10/25/2013  . Hyperlipidemia associated with type 2 diabetes mellitus ([place]) 10/25/2013  . Morbid obesity ([place]) 10/25/2013   Deneise Lever, MS,  CCC-SLP Speech-Language Pathologist  Aliene Altes 12/07/2017, 4:13 PM  [facility] [address] [place], [name], [address] Phone: [phone]   Fax:  [phone]   Name: David [name] MRN: [id number] Date of Birth: April 22, 1941

## 2017-12-10 ENCOUNTER — Other Ambulatory Visit: Payer: Self-pay | Admitting: Family Medicine

## 2017-12-11 ENCOUNTER — Ambulatory Visit: Payer: Medicare Other | Admitting: Speech Pathology

## 2017-12-11 DIAGNOSIS — I69315 Cognitive social or emotional deficit following cerebral infarction: Secondary | ICD-10-CM | POA: Diagnosis not present

## 2017-12-11 DIAGNOSIS — R41841 Cognitive communication deficit: Secondary | ICD-10-CM | POA: Diagnosis not present

## 2017-12-11 DIAGNOSIS — M6281 Muscle weakness (generalized): Secondary | ICD-10-CM | POA: Diagnosis not present

## 2017-12-11 DIAGNOSIS — R29818 Other symptoms and signs involving the nervous system: Secondary | ICD-10-CM | POA: Diagnosis not present

## 2017-12-11 DIAGNOSIS — R2689 Other abnormalities of gait and mobility: Secondary | ICD-10-CM | POA: Diagnosis not present

## 2017-12-11 NOTE — Patient Instructions (Signed)
Try to spend 30 minutes a day doing a cognitive activity  Cognitive Activities you can do at home:   - [place]  - Chess/Checkers  - Crosswords (easy level)  - [place]  On your computer, tablet or phone: BrainHQ Brainbashers.com Neuronation App [employer] Game App [employer] IQ Logic [place] Sort it out (easy) Photo Quiz  - what's the word Mix 2 Words Spot the difference games

## 2017-12-11 NOTE — Therapy (Signed)
[facility] [address] [place], [name], [address] Phone: [phone]   Fax:  [phone]  Speech Language Pathology Treatment and Discharge Summary  Patient Details  Name: David [name] MRN: [id number] Date of Birth: 26-Feb-1941 Referring Provider: Dr. Naaman Plummer  Speech Therapy Progress Note  Dates of Reporting Period: 10/05/17 to 12/11/17  Objective Reports of Subjective Statement: Pt has been seen for 10 speech therapy sessions targeting cognition  Encounter Date: 12/11/2017  End of Session - 12/11/17 1528    Visit Number  10    Number of Visits  17    Date for SLP Re-Evaluation  12/22/17    SLP Start Time  1445    SLP Stop Time   1510    SLP Time Calculation (min)  25 min    Activity Tolerance  Patient tolerated treatment well       Past Medical History:  Diagnosis Date  . Acute cholecystitis s/p lap cholecystectomy 03/05/2017 03/04/2017  . Anemia, iron deficiency   . Anxiety   . Arthritis   . Atrial fibrillation with rapid ventricular response ([place])   . BPH (benign prostatic hypertrophy)   . CAD (coronary artery disease)    Nonobstructive CAD per cath  . Cardiac pacemaker in situ   . CHF (congestive heart failure) ([place])   . Chronic ulcer of right foot ([place])   . CKD (chronic kidney disease) stage 4, GFR 15-29 ml/min (HCC) 09/26/2017  . CKD (chronic kidney disease), stage III ([place]) secondary to DM and HTN   nephrologist-  [name]  . Coronary artery disease involving native coronary artery of native heart without angina pectoris   . Diastolic dysfunction   . Dyspnea   . 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 History of cellulitis    right great toe 10-25-2014  . History of skin cancer   . HOCM (hypertrophic obstructive cardiomyopathy) ([place])   . Hyperlipidemia associated with type 2 diabetes mellitus ([place]) 10/25/2013  . Hypertension   . Hypertension  associated with diabetes ([place]) 10/25/2013  . 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 Insulin dependent type 2 diabetes mellitus ([place]) 1991   followd by [name]--  has insulin pump  . Insulin pump in place   . OSA on CPAP   . 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 Peripheral neuropathy    severe  . Peripheral vascular disease ([place])    bilateral lower extremities  . PVD (peripheral vascular disease) ([place])   . Rib fracture 07/24/2015  . Seasonal and perennial allergic rhinitis 10/25/2013  . Secondary hyperparathyroidism of renal origin ([place])   . 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 Sinus node dysfunction (HCC)   . Sleep apnea   . Syncope 07/24/2015    Past Surgical History:  Procedure Laterality Date  . AMPUTATION OF REPLICATED TOES  Mar 0160   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)  . AMPUTATION TOE Right 03/12/2015   Procedure: RIGHT HALLUS AMPUTATION ;  Surgeon: [name], MD;  Location: [facility];  Service: Podiatry;  Laterality: Right;  . CARDIAC CATHETERIZATION  11-25-2010   [place], [place]   Nonobstructive CAD  . CARDIAC PACEMAKER PLACEMENT  Nov 2009   [employer]  . CHOLECYSTECTOMY N/A 03/05/2017   Procedure: LAPAROSCOPIC CHOLECYSTECTOMY WITH INTRAOPERATIVE CHOLANGIOGRAM;  Surgeon: [name], MD;  [facility];  Service: General;  Laterality: N/A;  . EP IMPLANTABLE DEVICE N/A 06/03/2015   Procedure: PPM  Nature conservation officer;  Surgeon: [name], MD;  Location: [facility];  Service: Cardiovascular;  Laterality: N/A;  . EXCISION BONE CYST Right 03/06/2015   Procedure: BONE BIOPSIES OF RIGHT FOOT;  Surgeon: [name], MD;  Location: [facility];  Service: Podiatry;  Laterality: Right;  . ORIF ANKLE FRACTURE Left 11/06/2014   Procedure: OPEN REDUCTION INTERNAL FIXATION (ORIF) LEFT  ANKLE FRACTURE;  Surgeon: [name], MD;  Location: [facility];  Service: Orthopedics;  Laterality: Left;  . TEE WITHOUT  CARDIOVERSION N/A 09/01/2017   Procedure: TRANSESOPHAGEAL ECHOCARDIOGRAM (TEE);  Surgeon: [name], MD;  Location: [facility];  Service: Cardiovascular;  Laterality: N/A;  . TOTAL KNEE ARTHROPLASTY    . VEIN LIGATION AND STRIPPING      There were no vitals filed for this visit.  Subjective Assessment - 12/11/17 1521    Subjective  "The biggest thing I learned was to double check myself."    Patient is accompained by:  Family member    Currently in Pain?  No/denies            ADULT SLP TREATMENT - 12/11/17 1445      General Information   Behavior/Cognition  Alert;Cooperative;Pleasant mood      Treatment Provided   Treatment provided  Cognitive-Linquistic      Pain Assessment   Pain Assessment  No/denies pain      Cognitive-Linquistic Treatment   Treatment focused on  Cognition;Patient/family/caregiver education    Skilled Treatment  Patient arrived with his wife for session; SLP facilitated discussion re: patient's personal goals and functioning at home. Pt and wife both report they are pleased with his progress. Wife reports she has noticed improvements in reasoning and problem solving skills. He was pleased he was able to work through figuring out how change his neighbor's bicycle tire over the weekend. Pt acknowledges that some tasks take him longer and that he knows he needs to double check for errors. SLP provided education re: cognitive activities for home and stroke warning signs (BE FAST).      Assessment / Recommendations / Plan   Plan  Discharge SLP treatment due to (comment) pt pleased with current functional level      Progression Toward Goals   Progression toward goals  Goals met, education completed, patient discharged from [facility] Education - 12/11/17 1527    Education provided  Yes    Education Details  cognitive activities for home, BE FAST    Person(s) Educated  Patient;Spouse    Methods  Explanation;Handout    Comprehension  Verbalized  understanding       SLP Short Term Goals - 12/11/17 1532      SLP SHORT TERM GOAL #1   Title  Pt will selectively attend to mod complex cognitive linguistic task for 10 minutes with distractions and occasional min redirection over 3 sessions    Status  Partially Met      SLP SHORT TERM GOAL #2   Title  Pt will complete mod complex organization, reasoning, problem solving tasks with 85% accuracy and occasional min A over 3 sessions    Status  Not Met      SLP SHORT TERM GOAL #3   Title  Pt will ID errors and self correct 3/5 errors with occasional min A over 3 sessions    Status  Not Met      SLP SHORT TERM GOAL #4  Title  Pt will utilize external aids for medication, schedule management with occasional min A from family outside of therapy over 3 sessions    Status  Achieved       SLP Long Term Goals - 12/11/17 1532      SLP LONG TERM GOAL #1   Title  Pt will alternate attention between 2 mod complex cognitive linguistic tasks for 15 minutes with 85% on each and rare min A over 3 sessions    Time  3    Period  Weeks    Status  Partially Met      SLP LONG TERM GOAL #2   Title  Pt will self correct errors 4/5 errors with occasional min A over 3 sessions    Time  3    Period  Weeks    Status  Partially Met      SLP LONG TERM GOAL #3   Title  Pt will utilize external aids for medication, schedule management with rare min A from family outside of therapy over 5 sessions    Time  3    Period  Weeks    Status  Partially Met       Plan - 12/11/17 1528    Clinical Impression Statement  Mr. Vaquera continues with mild cognitive deficits in problem solving, planning, and reasoning. Error awareness has improved and per wife pt is spontaneously double checking himself in tasks at home. Pt has continued to require occasional cues for double checking in sessions, and assistance in mod complex reasoning/problem solving tasks, however pt and his wife report they are pleased with his  current functional level and are in agreement with d/c at this time.     Speech Therapy Frequency  -- d/c    Treatment/Interventions  Cognitive reorganization;Compensatory strategies;Language facilitation;Compensatory techniques;Cueing hierarchy;Internal/external aids;Functional tasks;SLP instruction and feedback;Patient/family education;Environmental controls    Potential to Achieve Goals  Good    Consulted and Agree with Plan of Care  Patient;Family member/caregiver       Patient will benefit from skilled therapeutic intervention in order to improve the following deficits and impairments:   Cognitive communication deficit    Problem List Patient Active Problem List   Diagnosis Date Noted  . Senile purpura ([place]) 11/27/2017  . CKD (chronic kidney disease) stage 4, GFR 15-29 ml/min (HCC) 09/26/2017  . 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 Diastolic dysfunction   . Coronary artery disease involving native coronary artery of native heart without angina pectoris   . Cardiac pacemaker in situ   . Atrial fibrillation with rapid ventricular response ([place])   . PVD (peripheral vascular disease) ([place])   . Lower extremity edema 04/21/2017  . Anxiety 03/22/2017  . Acute cholecystitis s/p lap cholecystectomy 03/05/2017 03/04/2017  . HOCM (hypertrophic obstructive cardiomyopathy) ([place]) 03/04/2017  . 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 Fatigue 01/27/2017  . Low vitamin B12 level 01/27/2017  . Syncope 07/24/2015  . 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 OSA on CPAP 10/26/2014  . Back pain 10/16/2014  . Seasonal and perennial allergic rhinitis 10/25/2013  . Hypertension associated with diabetes ([place]) 10/25/2013  . Hyperlipidemia associated with type 2 diabetes mellitus ([place]) 10/25/2013  . Morbid obesity ([place]) 10/25/2013   SPEECH THERAPY DISCHARGE SUMMARY  Visits from Start of Care:  10  Current functional level related to goals / functional outcomes: Pt demonstrates reasoning, problem skills sufficient  for simple activities at home.    Remaining deficits: Pt continues with mild cognitive deficits including slowed processing, and impaired higher-level reasoning, problem solving and planning skills.   Education / Equipment: Cognitive activities for home, BE FAST  Plan: Patient agrees to discharge.  Patient goals were not met. Patient is being discharged due to meeting the stated rehab goals.  ?????   Deneise Lever, [place], CCC-SLP Speech-Language Pathologist                   Aliene Altes 12/11/2017, 3:33 PM  [facility] [address] [place], [name], [address] Phone: [phone]   Fax:  [phone]   Name: GENEVIEVE RITZEL MRN: [id number] Date of Birth: 09/23/1940

## 2017-12-13 NOTE — Progress Notes (Signed)
[facility] PREP Weekly Session   Patient Details  Name: David [name] MRN: [id number] Date of Birth: [birth year] Age: 77 y.o. PCP: [name], MD  Vitals:   12/13/17 1402  Weight: 273 lb (123.8 kg)    [facility] Weekly seesion - 12/13/17 1400      Weekly Session   Topic Discussed  Other ways to be active    Minutes exercised this week  15 minutes strength   strength   Classes attended to date  1       Things you are grateful for:"2nd chance at living" Nutrition celebrations:"more water consumption"  [name] 12/13/2017, 2:02 PM

## 2017-12-14 ENCOUNTER — Encounter: Payer: Medicare Other | Admitting: Speech Pathology

## 2017-12-18 ENCOUNTER — Encounter: Payer: Medicare Other | Admitting: Speech Pathology

## 2017-12-20 NOTE — Progress Notes (Signed)
[facility] PREP Weekly Session   Patient Details  Name: David [name] MRN: [id number] Date of Birth: 1940-08-30 Age: 77 y.o. PCP: [name], MD  Vitals:   12/20/17 1510  Weight: 269 lb (122 kg)    [facility] Weekly seesion - 12/20/17 1500      Weekly Session   Topic Discussed  Healthy eating tips    Minutes exercised this week  70 minutes 30cardio/20strength/24flexibility   30cardio/20strength/9flexibility      Fun things you did since last meeting:"beach time" Things you are grateful for:"Family"  [name] 12/20/2017, 3:14 PM

## 2017-12-27 ENCOUNTER — Other Ambulatory Visit (INDEPENDENT_AMBULATORY_CARE_PROVIDER_SITE_OTHER): Payer: Medicare Other

## 2017-12-27 DIAGNOSIS — E1165 Type 2 diabetes mellitus with hyperglycemia: Secondary | ICD-10-CM

## 2017-12-27 DIAGNOSIS — Z794 Long term (current) use of insulin: Secondary | ICD-10-CM | POA: Diagnosis not present

## 2017-12-27 DIAGNOSIS — E782 Mixed hyperlipidemia: Secondary | ICD-10-CM | POA: Diagnosis not present

## 2017-12-27 LAB — LIPID PANEL
Cholesterol: 123 mg/dL (ref 0–200)
HDL: 23.6 mg/dL — ABNORMAL LOW (ref >39.00)
NonHDL: 99.77
Total CHOL/HDL Ratio: 5
Triglycerides: 281 mg/dL — ABNORMAL HIGH (ref 0.0–149.0)
VLDL: 56.2 mg/dL — ABNORMAL HIGH (ref 0.0–40.0)

## 2017-12-27 LAB — COMPREHENSIVE METABOLIC PANEL
ALT: 17 U/L (ref 0–53)
AST: 16 U/L (ref 0–37)
Albumin: 3.9 g/dL (ref 3.5–5.2)
Alkaline Phosphatase: 76 U/L (ref 39–117)
BUN: 42 mg/dL — ABNORMAL HIGH (ref 6–23)
CO2: 29 mEq/L (ref 19–32)
Calcium: 10.1 mg/dL (ref 8.4–10.5)
Chloride: 105 mEq/L (ref 96–112)
Creatinine, Ser: 2.07 mg/dL — ABNORMAL HIGH (ref 0.40–1.50)
GFR: 33.23 mL/min — ABNORMAL LOW (ref >60.00)
Glucose, Bld: 137 mg/dL — ABNORMAL HIGH (ref 70–99)
Potassium: 4.6 mEq/L (ref 3.5–5.1)
Sodium: 139 mEq/L (ref 135–145)
Total Bilirubin: 0.4 mg/dL (ref 0.2–1.2)
Total Protein: 7 g/dL (ref 6.0–8.3)

## 2017-12-27 LAB — LDL CHOLESTEROL, DIRECT: Direct LDL: 50 mg/dL

## 2017-12-27 NOTE — Progress Notes (Signed)
[facility] PREP Weekly Session   Patient Details  Name: David [name] MRN: [id number] Date of Birth: 1940/11/15 Age: 77 y.o. PCP: [name], MD  Vitals:   12/27/17 1323  Weight: 276 lb (125.2 kg)    [facility] Weekly seesion - 12/27/17 1300      Weekly Session   Topic Discussed  Health habits    Minutes exercised this week  30 minutes        [name] 12/27/2017, 1:23 PM

## 2017-12-28 LAB — FRUCTOSAMINE: Fructosamine: 319 umol/L — ABNORMAL HIGH (ref 0–285)

## 2018-01-01 ENCOUNTER — Encounter: Payer: Self-pay | Admitting: Endocrinology

## 2018-01-01 ENCOUNTER — Ambulatory Visit (INDEPENDENT_AMBULATORY_CARE_PROVIDER_SITE_OTHER): Payer: Medicare Other | Admitting: Endocrinology

## 2018-01-01 ENCOUNTER — Encounter: Payer: Medicare Other | Admitting: Endocrinology

## 2018-01-01 VITALS — BP 142/68 | HR 68 | Ht 76.0 in | Wt 281.0 lb

## 2018-01-01 DIAGNOSIS — E1165 Type 2 diabetes mellitus with hyperglycemia: Secondary | ICD-10-CM | POA: Diagnosis not present

## 2018-01-01 DIAGNOSIS — E782 Mixed hyperlipidemia: Secondary | ICD-10-CM | POA: Diagnosis not present

## 2018-01-01 DIAGNOSIS — E6609 Other obesity due to excess calories: Secondary | ICD-10-CM

## 2018-01-01 DIAGNOSIS — Z794 Long term (current) use of insulin: Secondary | ICD-10-CM

## 2018-01-01 DIAGNOSIS — Z6834 Body mass index (BMI) 34.0-34.9, adult: Secondary | ICD-10-CM

## 2018-01-01 DIAGNOSIS — I639 Cerebral infarction, unspecified: Secondary | ICD-10-CM

## 2018-01-01 NOTE — Progress Notes (Signed)
Patient ID: David [name], male   DOB: Feb 20, 1941, 77 y.o.   MRN: [id number]    Reason for Appointment: Followup for Type 2 Diabetes  History of Present Illness:          Diagnosis: Type 2 diabetes mellitus, date of diagnosis:   1992       Past history:  He was initially treated with metformin and at some point also glipizide. His previous records from out of town are not available and no information is available about his level of control Apparently he was started on insulin in 1994 approximately because of poor control He has been on various insulin regimens over the last several years However even with insulin he has had poor control for at least the last 7 or 8 years. He does not know what his previous A1c levels have been. He had been continued on metformin and glipizide but metformin stopped because of kidney function abnormality He had been taking Lantus 60 units twice a day with NovoLog previously and also Before his initial consultation in 6/15 he was on NovoLog twice a day and Humalog mix insulin  Because of poor control and large insulin doses he was started on Victoza in 6/15  Since 04/28/14 he had been on a Medtronic insulin pump because of persistent poor control and high insulin requirement  Recent history:   Medtronic 723 PUMP  regimen with U-500 insulin as follows BASAL rates:Midnight = 0.55, 6 AM = 0.8, 1 PM = 0.85, 9 PM = 0.60    I/C ratio: 10, ISF: 45, target 120-150.  Active insulin 6 hours   Has had A1c's of mostly over 8%, last 8.3 compared to 7.6 previously   Current management, problems and blood sugar patterns:  He was started on the freestyle libre system sometime after his last visit in June 2019  With this he has had more frequent glucose monitoring than before and able to review his blood sugar patterns  Although his fructosamine and A1c are relatively high there is recent average blood sugar of 144 is lower than expected  Currently  no consistent blood sugar patterns are seen with his blood sugars averaging between about 140-160 at any given time  However he tends to have relatively higher blood sugar late morning and tendency to low normal blood sugars between 2-4 AM mostly  FASTING blood sugars are variable and may be occasionally low  He is still wanting to have a bedtime snack because of fear of low sugars during the night  He now says that with his blood sugars are near normal he does not bolus for meals and last 2 or 3 days does not appear to have significant postprandial hyperglycemia   However his BOLUSES are done about 5 to 10 minutes before eating instead of 30  Last night dinnertime he bolus for only 16 g of carbs as he had a low carbohydrate meals with mostly meats  Usually his mealtimes are variable no consistent postprandial hypoglycemia seen  He does try to exercise 3 or 4 days a week at the gym or walking, mostly going to the gym between 11 AM and 12 noon; only once did have a episode of low blood sugar while exercising and he uses a protein drink before starting  Non-insulin hypoglycemic drugs the patient is taking are: VICTOZA 1.2 mg daily        Glucose monitoring:  done about 4 times a day  Glucometer:  contour     Blood Glucose readings from download of meter and analysis of statistics as follows  CGM use % of time  84  Average and SD  144+/-52  Time in range  71     %  % Time Above 180  24  % Time above 250  4  % Time Below target  5     PRE-MEAL Fasting Lunch Dinner Bedtime Overall  Glucose range:       Mean/median:  160    154  124   POST-MEAL PC Breakfast PC Lunch PC Dinner  Glucose range:     Mean/median:  165  138  151      Self-care:   Meals: 2- 3 meals per day.   breakfast is cheerios or egg and toast.  Will have half sandwich with soup at 1 pm , dinner at  6-7 pm.     Bedtime snack is usually crackers  with milk or have been admitted sandwich         Exercise:   Walking occasionally and exercising including on elliptical for up to 60 minutes, twice a week at the gym  Last  consultation : Most recent: 08/2017      Wt Readings from Last 3 Encounters:  01/01/18 281 lb (127.5 kg)  01/01/18 281 lb (127.5 kg)  12/27/17 276 lb (125.2 kg)   Glycemic control:   Lab Results  Component Value Date   HGBA1C 8.3 (H) 10/24/2017   HGBA1C 7.6 (H) 08/31/2017   HGBA1C 8.1 (H) 05/23/2017   Lab Results  Component Value Date   MICROALBUR 11.9 (H) 10/24/2017   LDLCALC 106 (H) 08/31/2017   CREATININE 2.07 (H) 12/27/2017            Lab Results  Component Value Date   FRUCTOSAMINE 319 (H) 12/27/2017   FRUCTOSAMINE 280 10/24/2017   FRUCTOSAMINE 315 (H) 07/21/2017   FRUCTOSAMINE 328 (H) 08/05/2016     Other active problems: See review of systems   Allergies as of 01/01/2018      Reactions   Adhesive [tape] Other (See Comments)   blisters      Medication List        Accurate as of 01/01/18  4:01 PM. Always use your most recent med list.          acetaminophen 325 MG tablet Commonly known as:  TYLENOL Take 1-2 tablets (325-650 mg total) by mouth every 4 (four) hours as needed for mild pain.   acetic acid 2 % otic solution INSTILL 4 DROPS INTO RIGHT EAR 4 TIMES DAILY FOR 7 DAYS   atorvastatin 10 MG tablet Commonly known as:  LIPITOR TAKE 1 TABLET BY MOUTH ONCE DAILY AT  6  PM   calcitRIOL 0.25 MCG capsule Commonly known as:  ROCALTROL Take 1 capsule (0.25 mcg total) by mouth daily.   carvedilol 25 MG tablet Commonly known as:  COREG Take 1 tablet (25 mg total) by mouth 2 (two) times daily with a meal.   cholecalciferol 1000 units tablet Commonly known as:  VITAMIN D Take 1 tablet (1,000 Units total) by mouth daily.   ciprofloxacin-hydrocortisone OTIC suspension Commonly known as:  CIPRO HC OTIC Place 3 drops into both ears 2 (two) times daily.   clopidogrel 75 MG tablet Commonly known as:  PLAVIX TAKE 1 TABLET BY MOUTH ONCE  DAILY   diclofenac sodium 1 % Gel Commonly known as:  VOLTAREN Apply 2 g topically 3 (three)  times daily.   FREESTYLE LIBRE 14 DAY READER Devi 1 each by Does not apply route daily. USE TO CHECK BLOOD SUGAR   FREESTYLE LIBRE 14 DAY SENSOR Misc 1 each by Does not apply route daily. APPLY 1 SENSOR TO BODY ONCE EVERY 14 DAYS TO CHECK BLOOD SUGAR.   HUMULIN R 500 UNIT/ML injection Generic drug:  insulin regular human CONCENTRATED Inject into the skin.   insulin regular human CONCENTRATED 500 UNIT/ML injection Commonly known as:  HUMULIN R Use max 120 units daily or as directed in insulin pump, Dx code E11.65   hydrOXYzine 50 MG tablet Commonly known as:  ATARAX/VISTARIL Take 1 tablet (50 mg total) 3 (three) times daily as needed by mouth.   isosorbide mononitrate 30 MG 24 hr tablet Commonly known as:  IMDUR Take 1 tablet (30 mg total) by mouth daily.   liraglutide 18 MG/3ML Sopn Commonly known as:  VICTOZA Inject 0.2 mLs (1.2 mg total) into the skin daily.   pantoprazole 40 MG tablet Commonly known as:  PROTONIX TAKE ONE TABLET BY MOUTH ONCE DAILY   sertraline 100 MG tablet Commonly known as:  ZOLOFT Take 100 mg by mouth daily.   torsemide 10 MG tablet Commonly known as:  DEMADEX Take 1 tablet (10 mg total) by mouth 2 (two) times daily.   vitamin B-12 100 MCG tablet Commonly known as:  CYANOCOBALAMIN Take 100 mcg by mouth daily.       Allergies:  Allergies  Allergen Reactions  . Adhesive [Tape] Other (See Comments)    blisters    Past Medical History:  Diagnosis Date  . Acute cholecystitis s/p lap cholecystectomy 03/05/2017 03/04/2017  . Anemia, iron deficiency   . Anxiety   . Arthritis   . Atrial fibrillation with rapid ventricular response ([place])   . BPH (benign prostatic hypertrophy)   . CAD (coronary artery disease)    Nonobstructive CAD per cath  . Cardiac pacemaker in situ   . CHF (congestive heart failure) ([place])   . Chronic ulcer of right foot  ([place])   . CKD (chronic kidney disease) stage 4, GFR 15-29 ml/min (HCC) 09/26/2017  . CKD (chronic kidney disease), stage III ([place]) secondary to DM and HTN   nephrologist-  [name]  . Coronary artery disease involving native coronary artery of native heart without angina pectoris   . Diastolic dysfunction   . Dyspnea   . 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 History of cellulitis    right great toe 10-25-2014  . History of skin cancer   . HOCM (hypertrophic obstructive cardiomyopathy) ([place])   . Hyperlipidemia associated with type 2 diabetes mellitus ([place]) 10/25/2013  . Hypertension   . Hypertension associated with diabetes ([place]) 10/25/2013  . 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 Insulin dependent type 2 diabetes mellitus ([place]) 1991   followd by [name]--  has insulin pump  . Insulin pump in place   . OSA on CPAP   . 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 Peripheral neuropathy    severe  . Peripheral vascular disease ([place])    bilateral lower extremities  . PVD (peripheral vascular disease) ([place])   . Rib fracture 07/24/2015  . Seasonal and perennial allergic rhinitis 10/25/2013  . Secondary hyperparathyroidism of renal origin ([place])   . 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 Sinus node dysfunction ([facility])   . Sleep apnea   . Syncope 07/24/2015    Past  Surgical History:  Procedure Laterality Date  . 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 AMPUTATION TOE Right 03/12/2015   Procedure: RIGHT HALLUS AMPUTATION ;  Surgeon: [name], MD;  Location: [facility];  Service: Podiatry;  Laterality: Right;  . CARDIAC CATHETERIZATION  11-25-2010   [place], [place]   Nonobstructive CAD  . CARDIAC PACEMAKER PLACEMENT  Nov 2009   Medtronic  . CHOLECYSTECTOMY N/A 03/05/2017   Procedure: LAPAROSCOPIC CHOLECYSTECTOMY WITH INTRAOPERATIVE CHOLANGIOGRAM;  Surgeon: [name], MD;   Location: [facility];  Service: General;  Laterality: N/A;  . EP IMPLANTABLE DEVICE N/A 06/03/2015   Procedure: PPM Generator Changeout;  Surgeon: [name], MD;  Location: [facility];  Service: Cardiovascular;  Laterality: N/A;  . EXCISION BONE CYST Right 03/06/2015   Procedure: BONE BIOPSIES OF RIGHT FOOT;  Surgeon: [name], MD;  Location: [facility];  Service: Podiatry;  Laterality: Right;  . ORIF ANKLE FRACTURE Left 11/06/2014   Procedure: OPEN REDUCTION INTERNAL FIXATION (ORIF) LEFT  ANKLE FRACTURE;  Surgeon: [name], MD;  Location: [facility];  Service: Orthopedics;  Laterality: Left;  . TEE WITHOUT CARDIOVERSION N/A 09/01/2017   Procedure: TRANSESOPHAGEAL ECHOCARDIOGRAM (TEE);  Surgeon: [name], MD;  Location: [facility];  Service: Cardiovascular;  Laterality: N/A;  . TOTAL KNEE ARTHROPLASTY    . VEIN LIGATION AND STRIPPING      Family History  Problem Relation Age of Onset  . Cancer Mother        breast  . Heart attack Father   . Stroke Father     Social History:  reports that he quit smoking about 33 years ago. He has a 60.00 pack-year smoking history. He has never used smokeless tobacco. He reports that he drinks about 1.0 standard drinks of alcohol per week. He reports that he does not use drugs.    Review of Systems   Following is a copy of previous note      Lipids: Had been on a Lipitor since late 2014. Also had taken Gembrozil for several years without apparent side effects.   His 10 mg Lipitor apparently has been restarted after his visit with the PCP after his LDL level was high in 4/19 LDL now below 70 Previously had high ALT with Lipitor  HDL is consistently low  He is taking  fish oil OTC  but triglycerides are high as before   Lab Results  Component Value Date   CHOL 123 12/27/2017   CHOL 165 08/31/2017   CHOL 175 07/21/2017   Lab Results  Component Value Date   HDL 23.60 (L) 12/27/2017   HDL 20  (L) 08/31/2017   HDL 25.10 (L) 07/21/2017   Lab Results  Component Value Date   LDLCALC 106 (H) 08/31/2017   LDLCALC 94 02/17/2017   LDLCALC 49 10/29/2013   Lab Results  Component Value Date   TRIG 281.0 (H) 12/27/2017   TRIG 195 (H) 08/31/2017   TRIG 259.0 (H) 07/21/2017   Lab Results  Component Value Date   CHOLHDL 5 12/27/2017   CHOLHDL 8.3 08/31/2017   CHOLHDL 7 07/21/2017   Lab Results  Component Value Date   LDLDIRECT 50.0 12/27/2017   LDLDIRECT 76.0 07/21/2017   LDLDIRECT 89.0 05/23/2017    Lab Results  Component Value Date   ALT 17 12/27/2017       HYPERTENSION: The blood pressure has been treated with various  drugs including carvedilol 25 mg by his other physicians.  Blood pressure is somewhat variable  BP Readings from Last 3 Encounters:  01/01/18 (!) 142/68  01/01/18 (!) 142/68  12/07/17 (!) 106/52     Chronic kidney disease: Etiology unknown, is followed by nephrologist  Creatinine history:  Lab Results  Component Value Date   CREATININE 2.07 (H) 12/27/2017   CREATININE 2.17 (H) 10/24/2017   CREATININE 2.52 (H) 09/26/2017         Has history of Numbness in his feet  since about 2013  He has been wearing diabetic shoes.  He has been followed by podiatrist He is  under the care of a podiatrist      Physical Examination:  BP (!) 142/68   Pulse 68   Ht 6\' 4"  (1.93 m)   Wt 281 lb (127.5 kg)   BMI 34.20 kg/m     ASSESSMENT:  Diabetes type 2, uncontrolled with obesity See history of present illness for detailed discussion of current diabetes management, blood sugar patterns and problems identified   He is on the U-500 insulin on his pump with fair control  Although he has several high readings periodically he does not have any consistent abnormal patterns now this is now assessed with the help of the freestyle libre sensor which he has been using Not clear if he has correlated his freestyle [employer] with fingersticks although this  appears to be accurate when matched with the lab glucose done recently  He is somewhat concerned about potential for hypoglycemia overnight and blood sugars are relatively the lowest before waking up and before dinner in the 130+ range on average On average his blood sugars after meals are not significantly high but not consistent also This is partly related to his bolusing very few minutes before eating instead of 30 minutes with his U-500 insulin Also may not always bolus if the blood sugar is near normal because of fear of hypoglycemia He may do better with bolusing prior to eating at least 30 minutes and this may also reduce some delayed low normal sugar before suppertime  Blood sugars do tend to be rising between 8 AM and 12 noon but now he is doing significant amount of exercise before noon on at least 2 days a week   OBESITY: He has difficulty losing weight despite starting exercise   LIPIDS: Although LDL is well controlled his triglycerides are high, treatment for this is limited to fish oil because of renal dysfunction  PLAN:   Reduce basal rate at midnight down to 0.5 Carbohydrate coverage to be 1: 12 Target blood sugar 150 to allow reduced bolus amounts for correction and near normal blood sugars; he should be still continuing to benefit from Victoza with his postprandial hyperglycemia He is to bolus 30 minutes before eating   There are no Patient Instructions on file for this visit.    Elayne Snare 01/01/2018, 4:01 PM   Note: This office note was prepared with Dragon voice recognition system technology. Any transcriptional errors that result from this process are unintentional.

## 2018-01-01 NOTE — Patient Instructions (Signed)
Bolus 30 min before the meals

## 2018-01-02 ENCOUNTER — Other Ambulatory Visit: Payer: Self-pay | Admitting: Family Medicine

## 2018-01-02 NOTE — Progress Notes (Signed)
This encounter was created in error - please disregard.

## 2018-01-12 NOTE — Progress Notes (Signed)
[facility] PRE[facility] Weekly Session   Patient Details  Name: David [name] MRN: [id number] Date of Birth: July 06, 1940 Age: 77 y.o. PCP: [name], MD  Vitals:   01/12/18 1423  Weight: 270 lb (122.5 kg)    [facility] Weekly seesion - 01/12/18 1400      Weekly Session   Minutes exercised this week  50 minutes   20cardio/30strength     Fun things you did since last meeting:"[place]" Things you are grateful for:"a good woman for a wife" Nutrition celebrations:"stayed home"  [name] 01/12/2018, 2:24 PM

## 2018-01-29 ENCOUNTER — Other Ambulatory Visit: Payer: Self-pay | Admitting: Endocrinology

## 2018-01-29 ENCOUNTER — Telehealth: Payer: Self-pay | Admitting: Nutrition

## 2018-01-29 NOTE — Telephone Encounter (Signed)
Message left on my machine that patient said that his daughter took to the [employer] reader from him, because it was making his blood sugar drop.  He thinks this is not true and wants me to call him back. When I called him back, his wife answered and said the husband if very confused.  He had a blood sugar of 42 on Saturday afternoon.  He says he scanned his reading and his wife watched him take his insulin, and he ate a good meal, but blood sugar dropped.  His daughter checked the readings, and said the history shows he is not scanning, Nor is he taking his Victoza.   When asked why, he said there is not needle on the pen to do it.  His wife says he not

## 2018-01-30 ENCOUNTER — Ambulatory Visit: Payer: Medicare Other | Admitting: Sports Medicine

## 2018-01-30 NOTE — Telephone Encounter (Signed)
Pt using Libre/thx dmf

## 2018-01-30 NOTE — Telephone Encounter (Signed)
Please find out whether his blood sugars are getting low after meals are randomly in the afternoon and we will need to change his pump settings

## 2018-01-30 NOTE — Telephone Encounter (Signed)
Wife says he can not be trusted to take his insulin/or other meds, and is doing this for him now.  I explained the sensor does not drop his blood sugar low, and that it must be scanned every 8 hours.  She reported good understanding of this, and had no final questions for me.

## 2018-02-06 NOTE — Telephone Encounter (Signed)
Wife says that husband has had no low blood sugars.  She is watching what he eats and when he takes his insulin.  "It has been a few weeks since his last low"

## 2018-02-12 ENCOUNTER — Telehealth: Payer: Self-pay | Admitting: Cardiology

## 2018-02-12 ENCOUNTER — Ambulatory Visit (INDEPENDENT_AMBULATORY_CARE_PROVIDER_SITE_OTHER): Payer: Medicare Other | Admitting: *Deleted

## 2018-02-12 DIAGNOSIS — I495 Sick sinus syndrome: Secondary | ICD-10-CM | POA: Diagnosis not present

## 2018-02-12 NOTE — Telephone Encounter (Signed)
Confirmed remote transmission w/ pt wife.   

## 2018-02-13 ENCOUNTER — Encounter: Payer: Self-pay | Admitting: Sports Medicine

## 2018-02-13 ENCOUNTER — Ambulatory Visit (INDEPENDENT_AMBULATORY_CARE_PROVIDER_SITE_OTHER): Payer: Medicare Other | Admitting: Sports Medicine

## 2018-02-13 DIAGNOSIS — M79674 Pain in right toe(s): Secondary | ICD-10-CM | POA: Diagnosis not present

## 2018-02-13 DIAGNOSIS — E0842 Diabetes mellitus due to underlying condition with diabetic polyneuropathy: Secondary | ICD-10-CM

## 2018-02-13 DIAGNOSIS — R600 Localized edema: Secondary | ICD-10-CM

## 2018-02-13 DIAGNOSIS — B351 Tinea unguium: Secondary | ICD-10-CM | POA: Diagnosis not present

## 2018-02-13 DIAGNOSIS — Z899 Acquired absence of limb, unspecified: Secondary | ICD-10-CM | POA: Diagnosis not present

## 2018-02-13 DIAGNOSIS — M79675 Pain in left toe(s): Secondary | ICD-10-CM

## 2018-02-13 NOTE — Progress Notes (Signed)
Remote pacemaker transmission.   

## 2018-02-13 NOTE — Progress Notes (Signed)
Subjective: David [name] is a 77 y.o. male patient seen in office for Diabetic nail trim. Patient has a history of diabetes and a blood glucose "good" 119 this morning, A1c 8.3.  Reports that he has had a few episodes of low blood sugar due to being now on insulin pump. Patient is on [place]. Denies nausea/vomitting/fever/chills/night sweats or any constitutional symptoms. Patient has no other pedal complaints at this time.  Patient Active Problem List   Diagnosis Date Noted  . Senile purpura ([place]) 11/27/2017  . CKD (chronic kidney disease) stage 4, GFR 15-29 ml/min (HCC) 09/26/2017  . 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 Diastolic dysfunction   . Coronary artery disease involving native coronary artery of native heart without angina pectoris   . Cardiac pacemaker in situ   . Atrial fibrillation with rapid ventricular response ([place])   . PVD (peripheral vascular disease) ([place])   . Lower extremity edema 04/21/2017  . Anxiety 03/22/2017  . Acute cholecystitis s/p lap cholecystectomy 03/05/2017 03/04/2017  . HOCM (hypertrophic obstructive cardiomyopathy) ([place]) 03/04/2017  . 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 Fatigue 01/27/2017  . Low vitamin B12 level 01/27/2017  . Ephelides 09/19/2016  . Hemangioma 09/19/2016  . Neoplasm, uncertain whether benign or malignant 09/19/2016  . Seborrheic keratosis 09/19/2016  . Syncope 07/24/2015  . 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 OSA on CPAP 10/26/2014  . Back pain 10/16/2014  . Seasonal and perennial allergic rhinitis 10/25/2013  . Hypertension associated with diabetes ([place]) 10/25/2013  . Hyperlipidemia associated with type 2 diabetes mellitus ([place]) 10/25/2013  . Morbid obesity ([place]) 10/25/2013   Current Outpatient Medications on File Prior to Visit  Medication Sig Dispense Refill  . acetaminophen (TYLENOL) 325 MG tablet Take  1-2 tablets (325-650 mg total) by mouth every 4 (four) hours as needed for mild pain.    Marland Kitchen acetic acid 2 % otic solution INSTILL 4 DROPS INTO RIGHT EAR 4 TIMES DAILY FOR 7 DAYS  0  . atorvastatin (LIPITOR) 10 MG tablet TAKE 1 TABLET BY MOUTH ONCE DAILY AT  6  PM 90 tablet 0  . calcitRIOL (ROCALTROL) 0.25 MCG capsule Take 1 capsule (0.25 mcg total) by mouth daily. 90 capsule 1  . carvedilol (COREG) 25 MG tablet Take 1 tablet (25 mg total) by mouth 2 (two) times daily with a meal. 60 tablet 0  . cholecalciferol (VITAMIN D) 1000 units tablet Take 1 tablet (1,000 Units total) by mouth daily. 30 tablet 1  . ciprofloxacin-hydrocortisone (CIPRO HC) OTIC suspension Place 3 drops into both ears 2 (two) times daily. 10 mL 0  . clopidogrel (PLAVIX) 75 MG tablet TAKE 1 TABLET BY MOUTH ONCE DAILY 90 tablet 1  . Continuous Blood Gluc Receiver (FREESTYLE LIBRE 14 DAY READER) DEVI 1 each by Does not apply route daily. USE TO CHECK BLOOD SUGAR 1 Device 0  . Continuous Blood Gluc Sensor (FREESTYLE LIBRE 14 DAY SENSOR) MISC 1 each by Does not apply route daily. APPLY 1 SENSOR TO BODY ONCE EVERY 14 DAYS TO CHECK BLOOD SUGAR. 9 each 3  . diclofenac sodium (VOLTAREN) 1 % GEL Apply 2 g topically 3 (three) times daily. 4 Tube 0  . hydrOXYzine (ATARAX/VISTARIL) 50 MG tablet Take 1 tablet (50 mg total) 3 (three) times daily as needed by mouth. 30 tablet 1  . insulin regular human CONCENTRATED (HUMULIN R) 500 UNIT/ML injection Inject  into the skin.    Marland Kitchen insulin regular human CONCENTRATED (HUMULIN R) 500 UNIT/ML injection Use max 120 units daily or as directed in insulin pump, Dx code E11.65 20 mL 1  . isosorbide mononitrate (IMDUR) 30 MG 24 hr tablet Take 1 tablet (30 mg total) by mouth daily. 30 tablet 0  . liraglutide (VICTOZA) 18 MG/3ML SOPN Inject 0.2 mLs (1.2 mg total) into the skin daily. (Patient taking differently: Inject 2.6 mg into the skin daily. ) 2 pen 3  . pantoprazole (PROTONIX) 40 MG tablet TAKE ONE TABLET BY  MOUTH ONCE DAILY 90 tablet 1  . sertraline (ZOLOFT) 100 MG tablet Take 100 mg by mouth daily.    Marland Kitchen torsemide (DEMADEX) 10 MG tablet Take 1 tablet (10 mg total) by mouth 2 (two) times daily. 180 tablet 1  . vitamin B-12 (CYANOCOBALAMIN) 100 MCG tablet Take 100 mcg by mouth daily.     No current facility-administered medications on file prior to visit.    Allergies  Allergen Reactions  . Adhesive [Tape] Other (See Comments)    blisters    Recent Results (from the past 2160 hour(s))  Lipid panel     Status: Abnormal   Collection Time: 12/27/17  1:22 PM  Result Value Ref Range   Cholesterol 123 0 - 200 mg/dL    Comment: ATP III Classification       Desirable:  < 200 mg/dL               Borderline High:  200 - 239 mg/dL          High:  > = 240 mg/dL   Triglycerides 281.0 (H) 0.0 - 149.0 mg/dL    Comment: Normal:  <150 mg/dLBorderline High:  150 - 199 mg/dL   HDL 23.60 (L) >39.00 mg/dL   VLDL 56.2 (H) 0.0 - 40.0 mg/dL   Total CHOL/HDL Ratio 5     Comment:                Men          Women1/2 Average Risk     3.4          3.3Average Risk          5.0          4.42X Average Risk          9.6          7.13X Average Risk          15.0          11.0                       NonHDL 99.77     Comment: NOTE:  Non-HDL goal should be 30 mg/dL higher than patient's LDL goal (i.e. LDL goal of < 70 mg/dL, would have non-HDL goal of < 100 mg/dL)  Fructosamine     Status: Abnormal   Collection Time: 12/27/17  1:22 PM  Result Value Ref Range   Fructosamine 319 (H) 0 - 285 umol/L    Comment: Published reference interval for apparently healthy subjects between age 8 and 49 is 34 - 285 umol/L and in a poorly controlled diabetic population is 228 - 563 umol/L with a mean of 396 umol/L.   Comprehensive metabolic panel     Status: Abnormal   Collection Time: 12/27/17  1:22 PM  Result Value Ref Range   Sodium 139 135 - 145 mEq/L   Potassium 4.6  3.5 - 5.1 mEq/L   Chloride 105 96 - 112 mEq/L   CO2 29 19 -  32 mEq/L   Glucose, Bld 137 (H) 70 - 99 mg/dL   BUN 42 (H) 6 - 23 mg/dL   Creatinine, Ser 2.07 (H) 0.40 - 1.50 mg/dL   Total Bilirubin 0.4 0.2 - 1.2 mg/dL   Alkaline Phosphatase 76 39 - 117 U/L   AST 16 0 - 37 U/L   ALT 17 0 - 53 U/L   Total Protein 7.0 6.0 - 8.3 g/dL   Albumin 3.9 3.5 - 5.2 g/dL   Calcium 10.1 8.4 - 10.5 mg/dL   GFR 33.23 (L) >60.00 mL/min  LDL cholesterol, direct     Status: None   Collection Time: 12/27/17  1:22 PM  Result Value Ref Range   Direct LDL 50.0 mg/dL    Comment: Optimal:  <100 mg/dLNear or Above Optimal:  100-129 mg/dLBorderline High:  130-159 mg/dLHigh:  160-189 mg/dLVery High:  >190 mg/dL    Objective: There were no vitals filed for this visit.  General: Patient is awake, alert, oriented x 3 and in no acute distress.  Dermatology: Skin is warm and dry bilateral with a continued healed ulceration present left 3rd toe distal tuft, no erythema, no edema. No acute signs of infection. Nails are elongated, thick, and mycotic x7.    Vascular: Dorsalis Pedis pulse = 1/4 Bilateral,  Posterior Tibial pulse = 0/4 Bilateral,  Capillary Fill Time < 5 seconds, 1+ pitting edema bilateral.   Neurologic: Protective sensation absent to the level of knee using  the 5.07/10g [employer].  Musculosketal:  No Pain with palpation to healed ulceration. No pain with compression to calves bilateral. Hammertoe and history of toe amps on right.  Assessment and Plan:  Problem List Items Addressed This Visit      Other   Lower extremity edema    Other Visit Diagnoses    Pain due to onychomycosis of toenails of both feet    -  Primary   Diabetic polyneuropathy associated with diabetes mellitus due to underlying condition ([place])       History of amputation         -Complete examination performed  -Nails mechanically debrided using sterile chisel blade without incident -Continue with elevation of legs and PCP management of edema  and compression  stockings and follow up with PCP and cardiologist for edema control -Continue with good supportive shoes daily -Patient to return to office in 10-12 weeks for follow up diabetic nail care.   Landis Martins, DPM

## 2018-02-16 LAB — CUP PACEART REMOTE DEVICE CHECK
Battery Remaining Longevity: 73 mo
Battery Voltage: 3 V
Brady Statistic AP VP Percent: 0.07 %
Brady Statistic AP VS Percent: 99.58 %
Brady Statistic AS VP Percent: 0 %
Brady Statistic AS VS Percent: 0.36 %
Brady Statistic RA Percent Paced: 99.58 %
Brady Statistic RV Percent Paced: 0.07 %
Date Time Interrogation Session: 20190930154653
Implantable Lead Implant Date: 20091102
Implantable Lead Implant Date: 20091102
Implantable Lead Location: 753859
Implantable Lead Location: 753860
Implantable Lead Model: 5076
Implantable Lead Model: 5076
Implantable Pulse Generator Implant Date: 20170118
Lead Channel Impedance Value: 1178 Ohm
Lead Channel Impedance Value: 437 Ohm
Lead Channel Impedance Value: 475 Ohm
Lead Channel Impedance Value: 608 Ohm
Lead Channel Pacing Threshold Amplitude: 1.875 V
Lead Channel Pacing Threshold Amplitude: 2 V
Lead Channel Pacing Threshold Pulse Width: 0.4 ms
Lead Channel Pacing Threshold Pulse Width: 0.4 ms
Lead Channel Sensing Intrinsic Amplitude: 1.5 mV
Lead Channel Sensing Intrinsic Amplitude: 1.5 mV
Lead Channel Sensing Intrinsic Amplitude: 25.5 mV
Lead Channel Sensing Intrinsic Amplitude: 25.5 mV
Lead Channel Setting Pacing Amplitude: 2.5 V
Lead Channel Setting Pacing Amplitude: 3.75 V
Lead Channel Setting Pacing Pulse Width: 0.4 ms
Lead Channel Setting Sensing Sensitivity: 2.8 mV

## 2018-02-21 DIAGNOSIS — N2581 Secondary hyperparathyroidism of renal origin: Secondary | ICD-10-CM | POA: Diagnosis not present

## 2018-02-21 DIAGNOSIS — R809 Proteinuria, unspecified: Secondary | ICD-10-CM | POA: Diagnosis not present

## 2018-02-21 DIAGNOSIS — Z8673 Personal history of transient ischemic attack (TIA), and cerebral infarction without residual deficits: Secondary | ICD-10-CM | POA: Diagnosis not present

## 2018-02-21 DIAGNOSIS — E1122 Type 2 diabetes mellitus with diabetic chronic kidney disease: Secondary | ICD-10-CM | POA: Diagnosis not present

## 2018-02-21 DIAGNOSIS — I129 Hypertensive chronic kidney disease with stage 1 through stage 4 chronic kidney disease, or unspecified chronic kidney disease: Secondary | ICD-10-CM | POA: Diagnosis not present

## 2018-02-21 DIAGNOSIS — M869 Osteomyelitis, unspecified: Secondary | ICD-10-CM | POA: Diagnosis not present

## 2018-02-21 DIAGNOSIS — N183 Chronic kidney disease, stage 3 (moderate): Secondary | ICD-10-CM | POA: Diagnosis not present

## 2018-02-21 DIAGNOSIS — N401 Enlarged prostate with lower urinary tract symptoms: Secondary | ICD-10-CM | POA: Diagnosis not present

## 2018-02-21 DIAGNOSIS — R3911 Hesitancy of micturition: Secondary | ICD-10-CM | POA: Diagnosis not present

## 2018-02-21 DIAGNOSIS — Z23 Encounter for immunization: Secondary | ICD-10-CM | POA: Diagnosis not present

## 2018-02-21 DIAGNOSIS — D509 Iron deficiency anemia, unspecified: Secondary | ICD-10-CM | POA: Diagnosis not present

## 2018-02-21 DIAGNOSIS — E875 Hyperkalemia: Secondary | ICD-10-CM | POA: Diagnosis not present

## 2018-02-21 LAB — CBC AND DIFFERENTIAL
HCT: 39 — AB (ref 41–53)
Hemoglobin: 12.9 — AB (ref 13.5–17.5)
Neutrophils Absolute: 5
Platelets: 163 (ref 150–399)
WBC: 7.8

## 2018-02-21 LAB — BASIC METABOLIC PANEL
BUN: 66 — AB (ref 4–21)
Creatinine: 2.4 — AB (ref 0.6–1.3)
Glucose: 211
Potassium: 4.3 (ref 3.4–5.3)
Sodium: 139 (ref 137–147)

## 2018-02-21 LAB — HEPATIC FUNCTION PANEL
ALT: 15 (ref 10–40)
AST: 14 (ref 14–40)
Alkaline Phosphatase: 89 (ref 25–125)
Bilirubin, Total: 0.5

## 2018-02-21 LAB — IRON,TIBC AND FERRITIN PANEL: Ferritin: 270

## 2018-03-01 ENCOUNTER — Encounter: Payer: Self-pay | Admitting: Physical Therapy

## 2018-03-05 ENCOUNTER — Encounter: Payer: Self-pay | Admitting: Adult Health

## 2018-03-05 ENCOUNTER — Ambulatory Visit (INDEPENDENT_AMBULATORY_CARE_PROVIDER_SITE_OTHER): Payer: Medicare Other | Admitting: Adult Health

## 2018-03-05 VITALS — BP 184/67 | HR 78 | Ht 76.0 in | Wt 284.6 lb

## 2018-03-05 DIAGNOSIS — E785 Hyperlipidemia, unspecified: Secondary | ICD-10-CM | POA: Diagnosis not present

## 2018-03-05 DIAGNOSIS — Z794 Long term (current) use of insulin: Secondary | ICD-10-CM | POA: Diagnosis not present

## 2018-03-05 DIAGNOSIS — I639 Cerebral infarction, unspecified: Secondary | ICD-10-CM

## 2018-03-05 DIAGNOSIS — I10 Essential (primary) hypertension: Secondary | ICD-10-CM

## 2018-03-05 DIAGNOSIS — E1159 Type 2 diabetes mellitus with other circulatory complications: Secondary | ICD-10-CM | POA: Diagnosis not present

## 2018-03-05 NOTE — Progress Notes (Signed)
David [name] [address] [name] [address] [phone]       OFFICE FOLLOW UP NOTE  David [name] Date of Birth:  Jan 31, 1941 Medical Record Number:  [id number]   Reason for visit: Stroke follow up  CHIEF COMPLAINT:  Chief Complaint  Patient presents with  . Follow-up    4 month follow up. Patient is accomplanied by his wife. Rm 9. No concerns at this time.     HPI: David [name] is being seen today in the office for small areas of right frontal cortex posteriorly compatible with acute infarction on 08/30/17. History obtained from patient and chart review. Reviewed all radiology images and labs personally.  [name] is a 77 y.o. male with history of diabetes, hypertension, significant heart disease status post pacemaker placement who presented with L sided weakness x 2 days.  CT head reviewed showed no acute stroke.  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  Carotid Dopplers showed right ICA stenosis of 1 to 39% but study was limited in left carotid and ended due to patient's combativeness.  2D echo showed EF of 60 to 65%.  TEE was negative for thrombus and PFO.  Pacemaker interrogated which did not show evidence of A. fib.  LDL 6 as patient was previously on fish oil was recommended to start Lipitor 10 mg.  A1c 7.6 and recommended close PCP follow-up.  Recommended DAPT of aspirin and Plavix for 3 weeks followed by Plavix alone as patient was previously on aspirin 81 mg.  Therapy is recommended CIR for continued therapies.  10/31/2017 visit: Patient is being seen today for hospital follow-up and is accompanied by his wife.  Overall he continues to do well and continues to improve with PT/OT at [place] neuro [place]hab clinic [place].  He continues to take Plavix with mild bruising but no bleeding.  Continues to take Lipitor without side effects myalgias.  BP satisfactory  102/53.  He has returned to all previous activities except for driving as OT has stated that he is not quite there at multitasking at.  He states he feels a very mild weakness in his left side and does use a cane for stability but he did use this prior to the stroke as well.  Denies new or worsening stroke/TIA symptoms.  Interval history 03/05/2018: Patient returns today for follow up appointment with his wife. Wife states he has been making improvements daily. He continues to use cane on occasion but more for "security" reasons. Completed PT/OT since previous visit but remains active with his gym exercising. Continues to take plavix without bleeding but mild bruising. Continues lipitor without side effects of myalgias with recent LDL 50. Patient has returned to driving without complications along with all other activities except for golfing due to intermittent balance issues which he believes is from arthritis in his knees and decreased stamina. Blood pressure today 184/67, asymptomatic.  Rechecked manual at 178/68. Monitors at home intermittently as it was previously stable. Glucose levels have been stable as he does monitor at home.  No further concerns at this time.  Denies new or worsening stroke/TIA symptoms.   ROS:   14 system review of systems performed and negative with exception of easy bruising and swelling in legs  PMH:  Past Medical History:  Diagnosis Date  . Acute cholecystitis s/p lap cholecystectomy 03/05/2017 03/04/2017  . Anemia, iron deficiency   . Anxiety   .  Arthritis   . Atrial fibrillation with rapid ventricular response ([place])   . BPH (benign prostatic hypertrophy)   . CAD (coronary artery disease)    Nonobstructive CAD per cath  . Cardiac pacemaker in situ   . CHF (congestive heart failure) ([place])   . Chronic ulcer of right foot ([place])   . CKD (chronic kidney disease) stage 4, GFR 15-29 ml/min (HCC) 09/26/2017  . CKD (chronic kidney disease), stage III ([place]) secondary to  DM and HTN   nephrologist-  [name]  . Coronary artery disease involving native coronary artery of native heart without angina pectoris   . Diastolic dysfunction   . Dyspnea   . 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 History of cellulitis    right great toe 10-25-2014  . History of skin cancer   . HOCM (hypertrophic obstructive cardiomyopathy) ([place])   . Hyperlipidemia associated with type 2 diabetes mellitus ([place]) 10/25/2013  . Hypertension   . Hypertension associated with diabetes ([place]) 10/25/2013  . 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 Insulin dependent type 2 diabetes mellitus ([place]) 1991   followd by [name]--  has insulin pump  . Insulin pump in place   . OSA on CPAP   . 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 Peripheral neuropathy    severe  . Peripheral vascular disease ([place])    bilateral lower extremities  . PVD (peripheral vascular disease) ([place])   . Rib fracture 07/24/2015  . Seasonal and perennial allergic rhinitis 10/25/2013  . Secondary hyperparathyroidism of renal origin ([place])   . 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 Sinus node dysfunction (HCC)   . Sleep apnea   . Syncope 07/24/2015    PSH:  Past Surgical History:  Procedure Laterality Date  . 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 AMPUTATION TOE Right 03/12/2015   Procedure: RIGHT HALLUS AMPUTATION ;  Surgeon: [name], MD;  Location: [facility];  Service: Podiatry;  Laterality: Right;  . CARDIAC CATHETERIZATION  11-25-2010   [place], [place]   Nonobstructive CAD  . CARDIAC PACEMAKER PLACEMENT  Nov 2009   [employer]  . CHOLECYSTECTOMY N/A 03/05/2017   Procedure: LAPAROSCOPIC CHOLECYSTECTOMY WITH INTRAOPERATIVE CHOLANGIOGRAM;  Surgeon: [name], MD;  Location: [facility];  Service: General;  Laterality: N/A;  . EP IMPLANTABLE DEVICE N/A 06/03/2015   Procedure: PPM Generator Changeout;   Surgeon: [name], MD;  Location: [facility];  Service: Cardiovascular;  Laterality: N/A;  . EXCISION BONE CYST Right 03/06/2015   Procedure: BONE BIOPSIES OF RIGHT FOOT;  Surgeon: [name], MD;  Location: [facility];  Service: Podiatry;  Laterality: Right;  . ORIF ANKLE FRACTURE Left 11/06/2014   Procedure: OPEN REDUCTION INTERNAL FIXATION (ORIF) LEFT  ANKLE FRACTURE;  Surgeon: [name], MD;  Location: [facility];  Service: Orthopedics;  Laterality: Left;  . TEE WITHOUT CARDIOVERSION N/A 09/01/2017   Procedure: TRANSESOPHAGEAL ECHOCARDIOGRAM (TEE);  Surgeon: [name], MD;  Location: [facility];  Service: Cardiovascular;  Laterality: N/A;  . TOTAL KNEE ARTHROPLASTY    . VEIN LIGATION AND STRIPPING      Social History:  Social History   Socioeconomic History  . Marital status: Married    Spouse name: Not on file  . Number of children: 3  . Years of education: 73  . Highest education level: Not on file  Occupational  History  . Occupation: Retired  Scientific laboratory technician  . Financial resource strain: Not on file  . Food insecurity:    Worry: Not on file    Inability: Not on file  . Transportation needs:    Medical: Not on file    Non-medical: Not on file  Tobacco Use  . Smoking status: Former Smoker    Packs/day: 2.00    Years: 30.00    Pack years: 60.00    Last attempt to quit: 03/03/1984    Years since quitting: 34.0  . Smokeless tobacco: Never Used  Substance and Sexual Activity  . Alcohol use: Yes    Alcohol/week: 1.0 standard drinks    Types: 1 Glasses of wine per week    Comment: social  . Drug use: No  . Sexual activity: Not Currently  Lifestyle  . Physical activity:    Days per week: Not on file    Minutes per session: Not on file  . Stress: Not on file  Relationships  . Social connections:    Talks on phone: Not on file    Gets together: Not on file    Attends religious service: Not on file    Active member of  club or organization: Not on file    Attends meetings of clubs or organizations: Not on file    Relationship status: Not on file  . Intimate partner violence:    Fear of current or ex partner: Not on file    Emotionally abused: Not on file    Physically abused: Not on file    Forced sexual activity: Not on file  Other Topics Concern  . Not on file  Social History Narrative   Currently resides with his wife. 1 dog. Fun/Hobby: Golf    Denies any religious beliefs effecting health care.     Family History:  Family History  Problem Relation Age of Onset  . Cancer Mother        breast  . Heart attack Father   . Stroke Father     Medications:   Current Outpatient Medications on File Prior to Visit  Medication Sig Dispense Refill  . acetaminophen (TYLENOL) 325 MG tablet Take 1-2 tablets (325-650 mg total) by mouth every 4 (four) hours as needed for mild pain.    Marland Kitchen acetic acid 2 % otic solution INSTILL 4 DROPS INTO RIGHT EAR 4 TIMES DAILY FOR 7 DAYS  0  . atorvastatin (LIPITOR) 10 MG tablet TAKE 1 TABLET BY MOUTH ONCE DAILY AT  6  PM 90 tablet 0  . calcitRIOL (ROCALTROL) 0.25 MCG capsule Take 1 capsule (0.25 mcg total) by mouth daily. 90 capsule 1  . carvedilol (COREG) 25 MG tablet Take 1 tablet (25 mg total) by mouth 2 (two) times daily with a meal. 60 tablet 0  . cholecalciferol (VITAMIN D) 1000 units tablet Take 1 tablet (1,000 Units total) by mouth daily. 30 tablet 1  . ciprofloxacin-hydrocortisone (CIPRO HC) OTIC suspension Place 3 drops into both ears 2 (two) times daily. 10 mL 0  . clopidogrel (PLAVIX) 75 MG tablet TAKE 1 TABLET BY MOUTH ONCE DAILY 90 tablet 1  . Continuous Blood Gluc Receiver (FREESTYLE LIBRE 14 DAY READER) DEVI 1 each by Does not apply route daily. USE TO CHECK BLOOD SUGAR 1 Device 0  . Continuous Blood Gluc Sensor (FREESTYLE LIBRE 14 DAY SENSOR) MISC 1 each by Does not apply route daily. APPLY 1 SENSOR TO BODY ONCE EVERY 14 DAYS TO CHECK BLOOD  SUGAR. 9 each 3    . diclofenac sodium (VOLTAREN) 1 % GEL Apply 2 g topically 3 (three) times daily. 4 Tube 0  . hydrOXYzine (ATARAX/VISTARIL) 50 MG tablet Take 1 tablet (50 mg total) 3 (three) times daily as needed by mouth. 30 tablet 1  . insulin regular human CONCENTRATED (HUMULIN R) 500 UNIT/ML injection Inject into the skin.    Marland Kitchen insulin regular human CONCENTRATED (HUMULIN R) 500 UNIT/ML injection Use max 120 units daily or as directed in insulin pump, Dx code E11.65 20 mL 1  . isosorbide mononitrate (IMDUR) 30 MG 24 hr tablet Take 1 tablet (30 mg total) by mouth daily. 30 tablet 0  . liraglutide (VICTOZA) 18 MG/3ML SOPN Inject 0.2 mLs (1.2 mg total) into the skin daily. (Patient taking differently: Inject 2.6 mg into the skin daily. ) 2 pen 3  . pantoprazole (PROTONIX) 40 MG tablet TAKE ONE TABLET BY MOUTH ONCE DAILY 90 tablet 1  . sertraline (ZOLOFT) 100 MG tablet Take 100 mg by mouth daily.    Marland Kitchen torsemide (DEMADEX) 10 MG tablet Take 1 tablet (10 mg total) by mouth 2 (two) times daily. 180 tablet 1  . vitamin B-12 (CYANOCOBALAMIN) 100 MCG tablet Take 100 mcg by mouth daily.     No current facility-administered medications on file prior to visit.     Allergies:   Allergies  Allergen Reactions  . Adhesive [Tape] Other (See Comments)    blisters     Physical Exam  Vitals:   03/05/18 1322  BP: (!) 184/67  Pulse: 78  Weight: 284 lb 9.6 oz (129.1 kg)  Height: 6\' 4"  (1.93 m)   Body mass index is 34.64 kg/m. No exam data present  General: well developed, well nourished, pleasant elderly Caucasian male, seated, in no evident distress Head: head normocephalic and atraumatic.   Neck: supple with no carotid or supraclavicular bruits Cardiovascular: regular rate and rhythm, no murmurs; +3 pitting edema BLE Musculoskeletal: no deformity Skin:  no rash/petichiae Vascular:  Normal pulses all extremities  Neurologic Exam Mental Status: Awake and fully alert. Oriented to place and time. Recent and  remote memory intact. Attention span, concentration and fund of knowledge appropriate. Mood and affect appropriate.  Cranial Nerves: Fundoscopic exam reveals sharp disc margins. Pupils equal, briskly reactive to light. Extraocular movements full without nystagmus. Visual fields full to confrontation. Hearing intact. Facial sensation intact. Face, tongue, palate moves normally and symmetrically.  Motor: Normal bulk and tone. Normal strength in all tested extremity muscles. Sensory.: intact to touch , pinprick , position and vibratory sensation.  Coordination: Rapid alternating movements normal in all extremities. Finger-to-nose and heel-to-shin performed accurately bilaterally.  Very minimal amount of orbiting right arm of her left. Gait and Station: Arises from chair without difficulty. Stance is normal. Gait demonstrates normal stride length and balance . Able to heel, toe and tandem walk without difficulty.  Reflexes: 1+ and symmetric. Toes downgoing.      Diagnostic Data (Labs, Imaging, Testing)  CT HEAD WO CONTRAST  08/30/2017 IMPRESSION: 1. No acute intracranial findings. 2. Atrophy and white matter microvascular disease.  MR BRAIN WO CONTRAST 08/31/2017 IMPRESSION: No acute abnormality The patient was  not able to complete the study ADDENDUM: After further review, there are 2 small areas of restricted diffusion in the right frontal cortex posteriorly compatible with acute infarct.  VAS US CAROTID DUPLEX 08/31/2017 Final Interpretation: Right Carotid: Velocities in the right ICA are consistent with a 1-39% stenosis. Left Carotid: Study limited  and ended due to patient was combative. Vertebrals: Not done. Subclavians: Not done.  ECHO COMPLETE 08/31/2017 Study Conclusions - Left ventricle: The cavity size was normal. There was moderate   concentric hypertrophy. Systolic function was normal. The   estimated ejection fraction was in the range of 60% to 65%.   Doppler  parameters are consistent with abnormal left ventricular   relaxation (grade 1 diastolic dysfunction). - Left atrium: The atrium was moderately dilated. - Pericardium, extracardiac: A trivial pericardial effusion was   identified.  ECHO TEE  09/01/2017 Negative PFO No evidence of thrombus in the atrial cavity or appendage    ASSESSMENT: David [name] is a 77 y.o. year old male here with 2 small right frontal cortical infarcts on 08/30/2017 secondary to cryptogenic source. Vascular risk factors include DM, HTN and HLD.  Patient is being seen today for scheduled follow-up visit and overall doing well without residual deficits or recurring of symptoms.      PLAN: -Continue clopidogrel 75 mg daily  and Lipitor for secondary stroke prevention -F/u with PCP regarding your HLD management -f/u with nephrologist as scheduled along with recommending to follow-up with their office if he continues to have elevated blood pressures -Patient was advised to monitor blood pressure at home and if continues to remain elevated or experience symptoms with elevation such as headache, visual changes, chest pain or dizziness that he should be evaluated in the ED -Advised to continue to stay active with working out at his gym along with maintaining a healthy diet -Maintain strict control of hypertension with blood pressure goal below 130/90, diabetes with hemoglobin A1c goal below 6.5% and cholesterol with LDL cholesterol (bad cholesterol) goal below 70 mg/dL. I also advised the patient to eat a healthy diet with plenty of whole grains, cereals, fruits and vegetables, exercise regularly and maintain ideal body weight.  Follow up in 6 months or call earlier if needed   Greater than 50% of time during this 25 minute visit was spent on counseling,explanation of diagnosis of 2 small right frontal cortical infarcts, reviewing risk factor management of 08/30/2017, planning of further management, discussion with patient  and family and coordination of care    Venancio Poisson, [facility]  [name] [address] [place], [place] [address]  Phone [phone] Fax [phone]

## 2018-03-05 NOTE — Patient Instructions (Addendum)
Continue clopidogrel 75 mg daily  and lipitor 10mg   for secondary stroke prevention  Continue to follow up with PCP regarding cholesterol, blood pressure and diabetes management   Follow up with nephrologist as scheduled along with calling their office if your blood pressure continues to stay elevated If blood pressure remains elevated and you experience symptoms such as headache, vision changes, or dizziness, you should be evaluated in the ED  Continue to stay active exercising at gyms and maintain a healthy diet  Continue to monitor blood pressure at home  Maintain strict control of hypertension with blood pressure goal below 130/90, diabetes with hemoglobin A1c goal below 6.5% and cholesterol with LDL cholesterol (bad cholesterol) goal below 70 mg/dL. I also advised the patient to eat a healthy diet with plenty of whole grains, cereals, fruits and vegetables, exercise regularly and maintain ideal body weight.  Followup in the future with me in 6 months or call earlier if needed       Thank you for coming to see [name] at [facility]. I hope we have been able to provide you high quality care today.  You may receive a patient satisfaction survey over the next few weeks. We would appreciate your feedback and comments so that we may continue to improve ourselves and the health of our patients.

## 2018-03-07 NOTE — Progress Notes (Signed)
I agree with the above plan 

## 2018-04-02 ENCOUNTER — Other Ambulatory Visit (INDEPENDENT_AMBULATORY_CARE_PROVIDER_SITE_OTHER): Payer: Medicare Other

## 2018-04-02 DIAGNOSIS — Z794 Long term (current) use of insulin: Secondary | ICD-10-CM

## 2018-04-02 DIAGNOSIS — E1165 Type 2 diabetes mellitus with hyperglycemia: Secondary | ICD-10-CM

## 2018-04-02 LAB — HEMOGLOBIN A1C: Hgb A1c MFr Bld: 8.1 % — ABNORMAL HIGH (ref 4.6–6.5)

## 2018-04-02 LAB — GLUCOSE, RANDOM: Glucose, Bld: 166 mg/dL — ABNORMAL HIGH (ref 70–99)

## 2018-04-05 NOTE — Progress Notes (Signed)
Patient ID: David [name], male   DOB: May 09, 1941, 77 y.o.   MRN: [id number]    Reason for Appointment: Followup for Type 2 Diabetes  History of Present Illness:          Diagnosis: Type 2 diabetes mellitus, date of diagnosis:   1992       Past history:  He was initially treated with metformin and at some point also glipizide. Apparently he was started on insulin in 1994 approximately because of poor control He has been on various insulin regimens over the last several years However even with insulin he has had poor control for at least the last 7 or 8 years. He does not know what his previous A1c levels have been. He had been continued on metformin and glipizide but metformin stopped because of kidney function abnormality He had been taking Lantus 60 units twice a day with NovoLog previously and also Before his initial consultation in 6/15 he was on NovoLog twice a day and Humalog mix insulin  Because of poor control and large insulin doses he was started on Victoza in 6/15  Since 04/28/14 he had been on a Medtronic insulin pump because of persistent poor control and high insulin requirement  Recent history:   Medtronic 723 PUMP  regimen with U-500 insulin as follows  BASAL rates:Midnight = 0.5, 6 AM = 0.8, 1 PM = 0.85, 9 PM = 0.60    I/C ratio: 12, ISF: 60, target 150-150.  Active insulin 6 hours   Has had A1c's of mostly over 8%, last 8.1   Current management, problems and blood sugar patterns:  He has been using the freestyle libre system but he is not using this at night after supper and difficult to get a pattern of his blood sugars and eating  Highest blood sugars are at night  FASTING blood sugars are also mildly increased but variable with blood sugars as low as 93 fasting  He is also checking blood sugars fairly frequently on his fingersticks  BOLUSES: He is doing few boluses and not doing them consistently with every meal  Even with eating  breakfast containing carbohydrates such as bread or waffles he is not bolusing about half the time or more  HYPOGLYCEMIA has been infrequent with one low reading around 1 AM preceded by a bolus and also late afternoon after an unusually large bolus in the morning at breakfast  Not clear if he is counting carbohydrates accurately  He thinks he is taking Victoza regularly  Blood sugars do not appear to be going up after breakfast when he is bolusing  However of those boluses are being done right before eating or right at mealtimes  He is still trying to exercise some although not intensively  Non-insulin hypoglycemic drugs the patient is taking are: VICTOZA 1.2 mg daily        Glucose monitoring:  done about 4 times a day         Glucometer:  contour freestyle Libre     Blood Glucose readings from download of meter and analysis of statistics as follows  CGM use % of time  78  Average and SD  146  Time in range       77 %  % Time Above 180  22  % Time above 250  1  % Time Below target  1     PRE-MEAL Fasting Lunch Dinner Bedtime Overall  Glucose range:  93-220  Mean/median:  146  149  129  171    POST-MEAL PC Breakfast PC Lunch PC Dinner  Glucose range:     Mean/median:  162  133     Previous CGM average 124  Self-care:   Meals: 2- 3 meals per day.   breakfast is cheerios or egg and toast.  Will have half sandwich with soup at 1 pm , dinner at  6-7 pm.     Bedtime snack is usually crackers  with milk or have been admitted sandwich         Exercise:  Walking occasionally and exercising including on elliptical for up to 60 minutes, twice a week at the gym  Last  consultation : Most recent: 08/2017      Wt Readings from Last 3 Encounters:  04/06/18 280 lb (127 kg)  03/05/18 284 lb 9.6 oz (129.1 kg)  01/12/18 270 lb (122.5 kg)   Glycemic control:   Lab Results  Component Value Date   HGBA1C 8.1 (H) 04/02/2018   HGBA1C 8.3 (H) 10/24/2017   HGBA1C 7.6 (H)  08/31/2017   Lab Results  Component Value Date   MICROALBUR 11.9 (H) 10/24/2017   LDLCALC 106 (H) 08/31/2017   CREATININE 2.4 (A) 02/21/2018            Lab Results  Component Value Date   FRUCTOSAMINE 319 (H) 12/27/2017   FRUCTOSAMINE 280 10/24/2017   FRUCTOSAMINE 315 (H) 07/21/2017   FRUCTOSAMINE 328 (H) 08/05/2016     Other active problems: See review of systems   Allergies as of 04/06/2018      Reactions   Adhesive [tape] Other (See Comments)   blisters      Medication List        Accurate as of 04/06/18 11:59 PM. Always use your most recent med list.          acetaminophen 325 MG tablet Commonly known as:  TYLENOL Take 1-2 tablets (325-650 mg total) by mouth every 4 (four) hours as needed for mild pain.   acetic acid 2 % otic solution INSTILL 4 DROPS INTO RIGHT EAR 4 TIMES DAILY FOR 7 DAYS   atorvastatin 10 MG tablet Commonly known as:  LIPITOR TAKE 1 TABLET BY MOUTH ONCE DAILY AT  6  PM   calcitRIOL 0.25 MCG capsule Commonly known as:  ROCALTROL Take 1 capsule (0.25 mcg total) by mouth daily.   carvedilol 25 MG tablet Commonly known as:  COREG Take 1 tablet (25 mg total) by mouth 2 (two) times daily with a meal.   cholecalciferol 1000 units tablet Commonly known as:  VITAMIN D Take 1 tablet (1,000 Units total) by mouth daily.   ciprofloxacin-hydrocortisone OTIC suspension Commonly known as:  CIPRO HC OTIC Place 3 drops into both ears 2 (two) times daily.   clopidogrel 75 MG tablet Commonly known as:  PLAVIX TAKE 1 TABLET BY MOUTH ONCE DAILY   diclofenac sodium 1 % Gel Commonly known as:  VOLTAREN Apply 2 g topically 3 (three) times daily.   FREESTYLE LIBRE 14 DAY READER Devi 1 each by Does not apply route daily. USE TO CHECK BLOOD SUGAR   FREESTYLE LIBRE 14 DAY SENSOR Misc 1 each by Does not apply route daily. APPLY 1 SENSOR TO BODY ONCE EVERY 14 DAYS TO CHECK BLOOD SUGAR.   HUMULIN R 500 UNIT/ML injection Generic drug:  insulin regular  human CONCENTRATED Inject into the skin.   insulin regular human CONCENTRATED 500 UNIT/ML injection Commonly known  as:  HUMULIN R Use max 120 units daily or as directed in insulin pump, Dx code E11.65   hydrOXYzine 50 MG tablet Commonly known as:  ATARAX/VISTARIL Take 1 tablet (50 mg total) 3 (three) times daily as needed by mouth.   isosorbide mononitrate 30 MG 24 hr tablet Commonly known as:  IMDUR Take 1 tablet (30 mg total) by mouth daily.   liraglutide 18 MG/3ML Sopn Commonly known as:  VICTOZA Inject 0.2 mLs (1.2 mg total) into the skin daily.   pantoprazole 40 MG tablet Commonly known as:  PROTONIX TAKE ONE TABLET BY MOUTH ONCE DAILY   sertraline 100 MG tablet Commonly known as:  ZOLOFT Take 100 mg by mouth daily.   torsemide 10 MG tablet Commonly known as:  DEMADEX Take 1 tablet (10 mg total) by mouth 2 (two) times daily.   vitamin B-12 100 MCG tablet Commonly known as:  CYANOCOBALAMIN Take 100 mcg by mouth daily.       Allergies:  Allergies  Allergen Reactions  . Adhesive [Tape] Other (See Comments)    blisters    Past Medical History:  Diagnosis Date  . Acute cholecystitis s/p lap cholecystectomy 03/05/2017 03/04/2017  . Anemia, iron deficiency   . Anxiety   . Arthritis   . Atrial fibrillation with rapid ventricular response ([place])   . BPH (benign prostatic hypertrophy)   . CAD (coronary artery disease)    Nonobstructive CAD per cath  . Cardiac pacemaker in situ   . CHF (congestive heart failure) ([place])   . Chronic ulcer of right foot ([place])   . CKD (chronic kidney disease) stage 4, GFR 15-29 ml/min (HCC) 09/26/2017  . CKD (chronic kidney disease), stage III ([place]) secondary to DM and HTN   nephrologist-  [name]  . Coronary artery disease involving native coronary artery of native heart without angina pectoris   . Diastolic dysfunction   . Dyspnea   . 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 History  of cellulitis    right great toe 10-25-2014  . History of skin cancer   . HOCM (hypertrophic obstructive cardiomyopathy) ([place])   . Hyperlipidemia associated with type 2 diabetes mellitus ([place]) 10/25/2013  . Hypertension   . Hypertension associated with diabetes ([place]) 10/25/2013  . 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 Insulin dependent type 2 diabetes mellitus ([place]) 1991   followd by [name]--  has insulin pump  . Insulin pump in place   . OSA on CPAP   . 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 Peripheral neuropathy    severe  . Peripheral vascular disease ([place])    bilateral lower extremities  . PVD (peripheral vascular disease) ([place])   . Rib fracture 07/24/2015  . Seasonal and perennial allergic rhinitis 10/25/2013  . Secondary hyperparathyroidism of renal origin ([place])   . 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 Sinus node dysfunction (HCC)   . Sleep apnea   . Syncope 07/24/2015    Past Surgical History:  Procedure Laterality Date  . AMPUTATION OF REPLICATED TOES  Mar 9371   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)  . AMPUTATION TOE Right 03/12/2015   Procedure: RIGHT HALLUS AMPUTATION ;  Surgeon: [name], MD;  Location: [place];  Service: Podiatry;  Laterality: Right;  . CARDIAC CATHETERIZATION  11-25-2010   [place], [place]   Nonobstructive CAD  . CARDIAC PACEMAKER PLACEMENT  Nov 2009   Medtronic  . CHOLECYSTECTOMY N/A 03/05/2017  Procedure: LAPAROSCOPIC CHOLECYSTECTOMY WITH INTRAOPERATIVE CHOLANGIOGRAM;  Surgeon: [name], MD;  Location: [facility];  Service: General;  Laterality: N/A;  . EP IMPLANTABLE DEVICE N/A 06/03/2015   Procedure: PPM Generator Changeout;  Surgeon: [name], MD;  Location: [place] [facility];  Service: Cardiovascular;  Laterality: N/A;  . EXCISION BONE CYST Right 03/06/2015   Procedure: BONE BIOPSIES OF RIGHT FOOT;  Surgeon: [name], MD;  Location: [place];  Service: Podiatry;  Laterality:  Right;  . ORIF ANKLE FRACTURE Left 11/06/2014   Procedure: OPEN REDUCTION INTERNAL FIXATION (ORIF) LEFT  ANKLE FRACTURE;  Surgeon: [name], MD;  Location: [place];  Service: Orthopedics;  Laterality: Left;  . TEE WITHOUT CARDIOVERSION N/A 09/01/2017   Procedure: TRANSESOPHAGEAL ECHOCARDIOGRAM (TEE);  Surgeon: [name], MD;  Location: [place];  Service: Cardiovascular;  Laterality: N/A;  . TOTAL KNEE ARTHROPLASTY    . VEIN LIGATION AND STRIPPING      Family History  Problem Relation Age of Onset  . Cancer Mother        breast  . Heart attack Father   . Stroke Father     Social History:  reports that he quit smoking about 34 years ago. He has a 60.00 pack-year smoking history. He has never used smokeless tobacco. He reports that he drinks about 1.0 standard drinks of alcohol per week. He reports that he does not use drugs.    Review of Systems       Lipids: Has been on a Lipitor since late 2014. Also had taken Gembrozil for several years without apparent side effects.  LDL is below 70 Previously had high ALT with Lipitor but this is back to normal  HDL is consistently low  He is taking  fish oil OTC  but triglycerides are high to a variable extent   Lab Results  Component Value Date   CHOL 123 12/27/2017   CHOL 165 08/31/2017   CHOL 175 07/21/2017   Lab Results  Component Value Date   HDL 23.60 (L) 12/27/2017   HDL 20 (L) 08/31/2017   HDL 25.10 (L) 07/21/2017   Lab Results  Component Value Date   LDLCALC 106 (H) 08/31/2017   LDLCALC 94 02/17/2017   LDLCALC 49 10/29/2013   Lab Results  Component Value Date   TRIG 281.0 (H) 12/27/2017   TRIG 195 (H) 08/31/2017   TRIG 259.0 (H) 07/21/2017   Lab Results  Component Value Date   CHOLHDL 5 12/27/2017   CHOLHDL 8.3 08/31/2017   CHOLHDL 7 07/21/2017   Lab Results  Component Value Date   LDLDIRECT 50.0 12/27/2017   LDLDIRECT 76.0 07/21/2017   LDLDIRECT 89.0 05/23/2017    Lab Results   Component Value Date   ALT 15 02/21/2018       HYPERTENSION: The blood pressure has been treated with various drugs including carvedilol 25 mg by his other physicians.  Blood pressure is somewhat variable  BP Readings from Last 3 Encounters:  04/06/18 132/60  03/05/18 (!) 184/67  01/01/18 (!) 142/68     Chronic kidney disease: Etiology unknown, is followed by nephrologist  Creatinine history:  Lab Results  Component Value Date   CREATININE 2.4 (A) 02/21/2018   CREATININE 2.07 (H) 12/27/2017   CREATININE 2.17 (H) 10/24/2017         Has history of Numbness in his feet  since about 2013  He has been wearing diabetic shoes.  He has been followed by  podiatrist He is  under the care of a podiatrist   Vitamin B12 deficiency: He has been recommended a supplement although he does not know how much he is taking it and his medication list indicates only 100 mcg Previously was taking injections and not clear but this was stopped  Lab Results  Component Value Date   VITAMINB12 170 (L) 01/27/2017       Physical Examination:  BP 132/60   Pulse 76   Ht 6\' 4"  (1.93 m)   Wt 280 lb (127 kg)   SpO2 98%   BMI 34.08 kg/m     ASSESSMENT:  Diabetes type 2, uncontrolled with obesity See history of present illness for detailed discussion of current diabetes management, blood sugar patterns and problems identified   He is on the U-500 insulin on his pump  He has difficulty with getting his A1c down and this is still over 8% Not clear if this is representative since his average blood sugar at home was only 146 on his CGM However most of his high readings after supper when he does not monitor his blood sugar  His day-to-day management with the pump, boluses, blood sugar patterns, deficiencies were reviewed in detail Hypoglycemia has been minimal but only if he is not bolusing appropriately and has discussed above Still has some postprandial hypoglycemia despite taking Victoza  and not losing weight   B12 deficiency: Currently not treated adequately   LIPIDS: Has had high triglycerides  PLAN:   To bolus consistently with every meal especially breakfast and regardless of whether blood sugars are normal or not He will try to look up his carbohydrates more consistently to avoid over bolusing To check blood sugars before going to bed daily on his freestyle Libre at least Make sure and change the infusion set every 4 to 5 days at least He is to bolus 30 minutes before eating and he will remind his wife to tell him when to bolus Repeat B12 level on the next visit  Patient Instructions  B 12, 1000ug daily  Make sure you check your sugar with your freestyle libre sensor at bedtime daily  Try to bolus 30 minutes before eating more at least 15 to 20 minutes before the planned meal  Make sure you look up the carbohydrates accurately for every meal  If eating any carbohydrate including at breakfast or a lunch make sure you take a bolus  Call if starting to get any low sugars     Elayne Snare 04/08/2018, 9:11 PM   Note: This office note was prepared with Dragon voice recognition system technology. Any transcriptional errors that result from this process are unintentional.

## 2018-04-06 ENCOUNTER — Encounter: Payer: Self-pay | Admitting: Endocrinology

## 2018-04-06 ENCOUNTER — Ambulatory Visit (INDEPENDENT_AMBULATORY_CARE_PROVIDER_SITE_OTHER): Payer: Medicare Other | Admitting: Endocrinology

## 2018-04-06 VITALS — BP 132/60 | HR 76 | Ht 76.0 in | Wt 280.0 lb

## 2018-04-06 DIAGNOSIS — Z794 Long term (current) use of insulin: Secondary | ICD-10-CM

## 2018-04-06 DIAGNOSIS — E1149 Type 2 diabetes mellitus with other diabetic neurological complication: Secondary | ICD-10-CM | POA: Diagnosis not present

## 2018-04-06 DIAGNOSIS — E1165 Type 2 diabetes mellitus with hyperglycemia: Secondary | ICD-10-CM

## 2018-04-06 DIAGNOSIS — IMO0002 Reserved for concepts with insufficient information to code with codable children: Secondary | ICD-10-CM

## 2018-04-06 DIAGNOSIS — R5383 Other fatigue: Secondary | ICD-10-CM

## 2018-04-06 DIAGNOSIS — I639 Cerebral infarction, unspecified: Secondary | ICD-10-CM

## 2018-04-06 DIAGNOSIS — E538 Deficiency of other specified B group vitamins: Secondary | ICD-10-CM | POA: Diagnosis not present

## 2018-04-06 DIAGNOSIS — E782 Mixed hyperlipidemia: Secondary | ICD-10-CM | POA: Diagnosis not present

## 2018-04-06 NOTE — Patient Instructions (Addendum)
B 12, 1000ug daily  Make sure you check your sugar with your freestyle libre sensor at bedtime daily  Try to bolus 30 minutes before eating more at least 15 to 20 minutes before the planned meal  Make sure you look up the carbohydrates accurately for every meal  If eating any carbohydrate including at breakfast or a lunch make sure you take a bolus  Call if starting to get any low sugars

## 2018-04-09 ENCOUNTER — Encounter: Payer: Self-pay | Admitting: Family Medicine

## 2018-04-09 ENCOUNTER — Ambulatory Visit (INDEPENDENT_AMBULATORY_CARE_PROVIDER_SITE_OTHER): Payer: Medicare Other | Admitting: Family Medicine

## 2018-04-09 VITALS — BP 132/72 | HR 76 | Temp 98.2°F | Ht 76.0 in | Wt 280.4 lb

## 2018-04-09 DIAGNOSIS — R5383 Other fatigue: Secondary | ICD-10-CM | POA: Diagnosis not present

## 2018-04-09 DIAGNOSIS — E1159 Type 2 diabetes mellitus with other circulatory complications: Secondary | ICD-10-CM | POA: Diagnosis not present

## 2018-04-09 DIAGNOSIS — E538 Deficiency of other specified B group vitamins: Secondary | ICD-10-CM

## 2018-04-09 DIAGNOSIS — H6123 Impacted cerumen, bilateral: Secondary | ICD-10-CM | POA: Diagnosis not present

## 2018-04-09 DIAGNOSIS — R7989 Other specified abnormal findings of blood chemistry: Secondary | ICD-10-CM

## 2018-04-09 DIAGNOSIS — F419 Anxiety disorder, unspecified: Secondary | ICD-10-CM | POA: Diagnosis not present

## 2018-04-09 DIAGNOSIS — E785 Hyperlipidemia, unspecified: Secondary | ICD-10-CM | POA: Diagnosis not present

## 2018-04-09 DIAGNOSIS — I10 Essential (primary) hypertension: Secondary | ICD-10-CM

## 2018-04-09 DIAGNOSIS — I152 Hypertension secondary to endocrine disorders: Secondary | ICD-10-CM

## 2018-04-09 DIAGNOSIS — E1169 Type 2 diabetes mellitus with other specified complication: Secondary | ICD-10-CM | POA: Diagnosis not present

## 2018-04-09 LAB — TSH: TSH: 2.19 u[IU]/mL (ref 0.35–4.50)

## 2018-04-09 LAB — COMPREHENSIVE METABOLIC PANEL
ALT: 16 U/L (ref 0–53)
AST: 13 U/L (ref 0–37)
Albumin: 3.9 g/dL (ref 3.5–5.2)
Alkaline Phosphatase: 71 U/L (ref 39–117)
BUN: 54 mg/dL — ABNORMAL HIGH (ref 6–23)
CO2: 25 mEq/L (ref 19–32)
Calcium: 9.4 mg/dL (ref 8.4–10.5)
Chloride: 103 mEq/L (ref 96–112)
Creatinine, Ser: 2.43 mg/dL — ABNORMAL HIGH (ref 0.40–1.50)
GFR: 27.6 mL/min — ABNORMAL LOW (ref >60.00)
Glucose, Bld: 291 mg/dL — ABNORMAL HIGH (ref 70–99)
Potassium: 4.5 mEq/L (ref 3.5–5.1)
Sodium: 137 mEq/L (ref 135–145)
Total Bilirubin: 0.4 mg/dL (ref 0.2–1.2)
Total Protein: 6.4 g/dL (ref 6.0–8.3)

## 2018-04-09 LAB — CBC
HCT: 35.5 % — ABNORMAL LOW (ref 39.0–52.0)
Hemoglobin: 11.9 g/dL — ABNORMAL LOW (ref 13.0–17.0)
MCHC: 33.5 g/dL (ref 30.0–36.0)
MCV: 92.7 fl (ref 78.0–100.0)
Platelets: 167 10*3/uL (ref 150.0–400.0)
RBC: 3.83 Mil/uL — ABNORMAL LOW (ref 4.22–5.81)
RDW: 15.4 % (ref 11.5–15.5)
WBC: 8.6 10*3/uL (ref 4.0–10.5)

## 2018-04-09 LAB — VITAMIN B12: Vitamin B-12: 274 pg/mL (ref 211–911)

## 2018-04-09 NOTE — Assessment & Plan Note (Signed)
Likely multifactorial.  Check TSH, CMET, CBC, and B12.

## 2018-04-09 NOTE — Patient Instructions (Signed)
It was very nice to see you today!  We will check blood work today.  We will flush out your ears.  Take care, Dr [name]

## 2018-04-09 NOTE — Assessment & Plan Note (Signed)
Mood is stable.  Continue Zoloft 100 mg daily.

## 2018-04-09 NOTE — Progress Notes (Signed)
   Subjective:  David [name] is a 77 y.o. male who presents today with a chief complaint of fatigue.   HPI:  Fatigue, chronic problem Patient with worsening fatigue over the last few months.  He does not remember his last B12 injection.  He has been taking 100 mcg daily by mouth.  No other recent changes.  No recent illnesses.  No fever or chills.  No weight loss.  No night sweats.  No medication changes.  Sleeps fine.  He uses CPAP for his OSA and reports that the readings have been consistent with adequate sleep.  HTN, chronic problem, stable On Coreg 25 mg twice daily and Imdur 30 mg daily.  No reported side effects.  Anxiety, chronic problem, stable On Zoloft 100 mg daily.  Mood is doing well.  No anhedonia.  No depression.  HLD, chronic problem, stable on Lipitor 10 mg daily.  Tolerating well.  No reported myalgias.  ROS: Per HPI  PMH: He reports that he quit smoking about 34 years ago. He has a 60.00 pack-year smoking history. He has never used smokeless tobacco. He reports that he drinks about 1.0 standard drinks of alcohol per week. He reports that he does not use drugs.  Objective:  Physical Exam: BP 132/72 (BP Location: Left Arm, Patient Position: Sitting, Cuff Size: Normal)   Pulse 76   Temp 98.2 F (36.8 C) (Oral)   Ht 6\' 4"  (1.93 m)   Wt 280 lb 6.4 oz (127.2 kg)   SpO2 96%   BMI 34.13 kg/m   Wt Readings from Last 3 Encounters:  04/09/18 280 lb 6.4 oz (127.2 kg)  04/06/18 280 lb (127 kg)  03/05/18 284 lb 9.6 oz (129.1 kg)  Gen: NAD, resting comfortably HEENT: Bilateral EAC with impacted cerumen. CV: RRR with no murmurs appreciated Pulm: NWOB, CTAB with no crackles, wheezes, or rhonchi  Cerumen successfully irrigated by CMA.  Patient tolerated well without convocation.  Assessment/Plan:  Low vitamin B12 level Check B12 level.  Hypertension associated with diabetes ([place]) At goal.  Continue Coreg 25 mg twice daily and Imdur 30 mg daily.  Hyperlipidemia  associated with type 2 diabetes mellitus (HCC) Last LDL 106.  Continue atorvastatin 10 mg daily.  Fatigue Likely multifactorial.  Check TSH, CMET, CBC, and B12.  Anxiety Mood is stable.  Continue Zoloft 100 mg daily.  Cerumen impaction Successfully irrigated as noted above.  [name], MD 04/09/2018 12:10 PM

## 2018-04-09 NOTE — Assessment & Plan Note (Signed)
At goal.  Continue Coreg 25 mg twice daily and Imdur 30 mg daily.

## 2018-04-09 NOTE — Assessment & Plan Note (Signed)
Last LDL 106.  Continue atorvastatin 10 mg daily.

## 2018-04-09 NOTE — Assessment & Plan Note (Signed)
Check B12 level. 

## 2018-04-10 NOTE — Progress Notes (Signed)
Please inform patient of the following:  His B12 is in the low range of normal. I think this could explain some of his symptoms. Please start him back on our B12 injection protocol.  The rest of his blood work is all stable.  David [name]. [name], MD 04/10/2018 10:47 AM

## 2018-04-11 ENCOUNTER — Ambulatory Visit (INDEPENDENT_AMBULATORY_CARE_PROVIDER_SITE_OTHER): Payer: Medicare Other

## 2018-04-11 DIAGNOSIS — E538 Deficiency of other specified B group vitamins: Secondary | ICD-10-CM | POA: Diagnosis not present

## 2018-04-11 MED ORDER — CYANOCOBALAMIN 1000 MCG/ML IJ SOLN
1000.0000 ug | Freq: Once | INTRAMUSCULAR | Status: AC
  Administered 2018-04-11: 1000 ug via INTRAMUSCULAR

## 2018-04-11 NOTE — Progress Notes (Signed)
Per orders of Dr.Parker, injection of B-12 given by Francella Solian Left Deltoid . Patient tolerated injection well. Will make appointment at reception for one week.

## 2018-04-17 ENCOUNTER — Ambulatory Visit: Payer: Medicare Other

## 2018-04-17 ENCOUNTER — Ambulatory Visit: Payer: Medicare Other | Admitting: Family Medicine

## 2018-04-18 ENCOUNTER — Ambulatory Visit: Payer: Medicare Other

## 2018-04-18 ENCOUNTER — Ambulatory Visit (INDEPENDENT_AMBULATORY_CARE_PROVIDER_SITE_OTHER): Payer: Medicare Other

## 2018-04-18 DIAGNOSIS — E538 Deficiency of other specified B group vitamins: Secondary | ICD-10-CM

## 2018-04-18 MED ORDER — CYANOCOBALAMIN 1000 MCG/ML IJ SOLN
1000.0000 ug | Freq: Once | INTRAMUSCULAR | Status: AC
  Administered 2018-04-18: 1000 ug via INTRAMUSCULAR

## 2018-04-18 NOTE — Progress Notes (Signed)
Per orders of Dr.Worley, injection of Vitamin B12 given by Loralyn Freshwater.Given in right deltoid.Patient tolerated injection well.

## 2018-04-19 ENCOUNTER — Other Ambulatory Visit: Payer: Self-pay | Admitting: Family Medicine

## 2018-04-19 ENCOUNTER — Other Ambulatory Visit: Payer: Self-pay | Admitting: Endocrinology

## 2018-04-25 ENCOUNTER — Telehealth: Payer: Self-pay | Admitting: Family Medicine

## 2018-04-25 ENCOUNTER — Ambulatory Visit (INDEPENDENT_AMBULATORY_CARE_PROVIDER_SITE_OTHER): Payer: Medicare Other

## 2018-04-25 DIAGNOSIS — E538 Deficiency of other specified B group vitamins: Secondary | ICD-10-CM | POA: Diagnosis not present

## 2018-04-25 MED ORDER — CYANOCOBALAMIN 1000 MCG/ML IJ SOLN
1000.0000 ug | Freq: Once | INTRAMUSCULAR | Status: AC
  Administered 2018-04-25: 1000 ug via INTRAMUSCULAR

## 2018-04-25 NOTE — Telephone Encounter (Signed)
We received paperwork for this patient's CPAP machine and it was signed and faxed back last week.

## 2018-04-25 NOTE — Progress Notes (Signed)
Per orders of Dr. [name] injection of Vitamin B 12 1000 mcg  given in left deltoid by Loralyn Freshwater.Patient tolerated injection well.

## 2018-04-25 NOTE — Telephone Encounter (Signed)
Need Dr [name] to sign off on his CPAP machine need to be called in to homehealth.

## 2018-04-30 NOTE — Progress Notes (Signed)
The Co signer is Dr. [name].

## 2018-05-01 NOTE — Progress Notes (Addendum)
I have reviewed and agree with note, evaluation, plan.   Stephen Hunter, MD  

## 2018-05-14 ENCOUNTER — Ambulatory Visit (INDEPENDENT_AMBULATORY_CARE_PROVIDER_SITE_OTHER): Payer: Medicare Other

## 2018-05-14 DIAGNOSIS — I495 Sick sinus syndrome: Secondary | ICD-10-CM | POA: Diagnosis not present

## 2018-05-15 ENCOUNTER — Other Ambulatory Visit: Payer: Self-pay | Admitting: Family Medicine

## 2018-05-15 LAB — CUP PACEART REMOTE DEVICE CHECK
Battery Remaining Longevity: 69 mo
Battery Voltage: 3 V
Brady Statistic AP VP Percent: 0.05 %
Brady Statistic AP VS Percent: 99.76 %
Brady Statistic AS VP Percent: 0 %
Brady Statistic AS VS Percent: 0.18 %
Brady Statistic RA Percent Paced: 99.75 %
Brady Statistic RV Percent Paced: 0.06 %
Date Time Interrogation Session: 20191230174945
Implantable Lead Implant Date: 20091102
Implantable Lead Implant Date: 20091102
Implantable Lead Location: 753859
Implantable Lead Location: 753860
Implantable Lead Model: 5076
Implantable Lead Model: 5076
Implantable Pulse Generator Implant Date: 20170118
Lead Channel Impedance Value: 1216 Ohm
Lead Channel Impedance Value: 437 Ohm
Lead Channel Impedance Value: 456 Ohm
Lead Channel Impedance Value: 608 Ohm
Lead Channel Pacing Threshold Amplitude: 2 V
Lead Channel Pacing Threshold Amplitude: 2.5 V
Lead Channel Pacing Threshold Pulse Width: 0.4 ms
Lead Channel Pacing Threshold Pulse Width: 0.4 ms
Lead Channel Sensing Intrinsic Amplitude: 1.125 mV
Lead Channel Sensing Intrinsic Amplitude: 1.125 mV
Lead Channel Sensing Intrinsic Amplitude: 23.625 mV
Lead Channel Sensing Intrinsic Amplitude: 23.625 mV
Lead Channel Setting Pacing Amplitude: 2.5 V
Lead Channel Setting Pacing Amplitude: 4.25 V
Lead Channel Setting Pacing Pulse Width: 0.4 ms
Lead Channel Setting Sensing Sensitivity: 2.8 mV

## 2018-05-15 NOTE — Progress Notes (Signed)
Remote pacemaker transmission.   

## 2018-05-22 ENCOUNTER — Ambulatory Visit (INDEPENDENT_AMBULATORY_CARE_PROVIDER_SITE_OTHER): Payer: Medicare Other | Admitting: Podiatry

## 2018-05-22 ENCOUNTER — Encounter: Payer: Self-pay | Admitting: Podiatry

## 2018-05-22 ENCOUNTER — Telehealth: Payer: Self-pay | Admitting: *Deleted

## 2018-05-22 DIAGNOSIS — M79674 Pain in right toe(s): Secondary | ICD-10-CM | POA: Diagnosis not present

## 2018-05-22 DIAGNOSIS — B351 Tinea unguium: Secondary | ICD-10-CM

## 2018-05-22 DIAGNOSIS — E1142 Type 2 diabetes mellitus with diabetic polyneuropathy: Secondary | ICD-10-CM

## 2018-05-22 DIAGNOSIS — E1151 Type 2 diabetes mellitus with diabetic peripheral angiopathy without gangrene: Secondary | ICD-10-CM

## 2018-05-22 DIAGNOSIS — Z89421 Acquired absence of other right toe(s): Secondary | ICD-10-CM

## 2018-05-22 DIAGNOSIS — M79675 Pain in left toe(s): Secondary | ICD-10-CM | POA: Diagnosis not present

## 2018-05-22 NOTE — Progress Notes (Signed)
Subjective: David [name] is a 78 y.o. y.o. male who presents for preventative foot care today with diabetes, diabetic neuropathy and cc of painful, discolored, thick toenails and painful which interfere with daily activities.   Today, patient states his nose is bleeding (left nostril) due to him not using his humidifier on last evening. He is also on Plavix. He denies any chest pain, headache, dizziness, or vision changes on today. He states he has nosebleeds from time to time.  [name], MD is his PCP and last visit was 04/09/2018.   Current Outpatient Medications:  .  acetaminophen (TYLENOL) 325 MG tablet, Take 1-2 tablets (325-650 mg total) by mouth every 4 (four) hours as needed for mild pain., Disp: , Rfl:  .  acetic acid 2 % otic solution, INSTILL 4 DROPS INTO RIGHT EAR 4 TIMES DAILY FOR 7 DAYS, Disp: , Rfl: 0 .  atorvastatin (LIPITOR) 10 MG tablet, TAKE 1 TABLET BY MOUTH ONCE DAILY AT  6  PM, Disp: 90 tablet, Rfl: 0 .  calcitRIOL (ROCALTROL) 0.25 MCG capsule, Take 1 capsule (0.25 mcg total) by mouth daily., Disp: 90 capsule, Rfl: 1 .  carvedilol (COREG) 25 MG tablet, TAKE 1 TABLET BY MOUTH TWICE DAILY WITH A MEAL, Disp: 180 tablet, Rfl: 0 .  cholecalciferol (VITAMIN D) 1000 units tablet, Take 1 tablet (1,000 Units total) by mouth daily., Disp: 30 tablet, Rfl: 1 .  clopidogrel (PLAVIX) 75 MG tablet, TAKE 1 TABLET BY MOUTH ONCE DAILY, Disp: 90 tablet, Rfl: 1 .  Continuous Blood Gluc Receiver (FREESTYLE LIBRE 14 DAY READER) DEVI, 1 each by Does not apply route daily. USE TO CHECK BLOOD SUGAR, Disp: 1 Device, Rfl: 0 .  Continuous Blood Gluc Sensor (FREESTYLE LIBRE 14 DAY SENSOR) MISC, 1 each by Does not apply route daily. APPLY 1 SENSOR TO BODY ONCE EVERY 14 DAYS TO CHECK BLOOD SUGAR., Disp: 9 each, Rfl: 3 .  diclofenac sodium (VOLTAREN) 1 % GEL, Apply 2 g topically 3 (three) times daily., Disp: 4 Tube, Rfl: 0 .  insulin regular human CONCENTRATED (HUMULIN R) 500 UNIT/ML injection,  Inject into the skin., Disp: , Rfl:  .  insulin regular human CONCENTRATED (HUMULIN R) 500 UNIT/ML injection, Use max 120 units daily or as directed in insulin pump, Dx code E11.65, Disp: 20 mL, Rfl: 1 .  isosorbide mononitrate (IMDUR) 30 MG 24 hr tablet, Take 1 tablet (30 mg total) by mouth daily., Disp: 30 tablet, Rfl: 0 .  pantoprazole (PROTONIX) 40 MG tablet, TAKE ONE TABLET BY MOUTH ONCE DAILY, Disp: 90 tablet, Rfl: 1 .  sertraline (ZOLOFT) 100 MG tablet, Take 100 mg by mouth daily., Disp: , Rfl:  .  torsemide (DEMADEX) 10 MG tablet, TAKE 1 TABLET BY MOUTH TWICE DAILY, Disp: 180 tablet, Rfl: 1 .  VICTOZA 18 MG/3ML SOPN, INJECT 0.2MLS SUBCUTANEOUSLY ONCE DAILY, Disp: 6 pen, Rfl: 3 .  vitamin B-12 (CYANOCOBALAMIN) 100 MCG tablet, Take 100 mcg by mouth daily., Disp: , Rfl:   Allergies  Allergen Reactions  . Adhesive [Tape] Other (See Comments)    blisters    Objective:  Blood Pressures: Wrist Cuff BP: 215/63 Manual BP: 148/78 EMS: Palpated Systolic 454  Vascular Examination: Capillary refill time <4 seconds x 10 digits Dorsalis pedis pulses 1/4 b/l Posterior tibial pulses absent b/l No digital hair x 7 digits Skin temperature gradient WNL b/l  Dermatological Examination: Skin is warm and dry b/l with mild atrophy b/l  Scab noted on dorsal aspect left 2nd  digit healing. No erythema, no edema, no drainage, no flocculence.  Toenails b/l discolored, thick, dystrophic with subungual debris and pain with palpation to nailbeds due to thickness of nails.   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 Onychomycosis toenails b/l 2. S/p amputation digits 3. NIDDM with Peripheral arterial disease 4. Diabetic neuropathy  Plan: 1. Discuss diabetic foot care principles. Literature dispensed. 2. Toenails b/l were debrided in length and  girth without iatrogenic bleeding. 3. Patient to continue soft, supportive shoe gear. We will start process of diabetic shoes with filler for right foot.  4. Patient to report any pedal injuries to medical professional  5. Follow up 3 months. Patient/POA to call should there be a concern in the interim.  EMS visit during visit to check BP and nosebleed which did not resolve after an hour. BP was checked by EMS. Afrin Spray administered to left nostril by EMS. Patient refused transport to hospital for evaluation and was okayed by EMS to drive home. Our Nurse called patient's home and spoke to his wife who related patient was fine, nosebleed had stopped, and he was resting.

## 2018-05-22 NOTE — Telephone Encounter (Signed)
I spoke with pt's wife, [name] and she states pt is exhausted, pt's nosebleed has stopped and he ate a chicken sandwich. I told [name], that if pt had any questions, or needed anything from our office to call. [name] thanked me for checking on pt.

## 2018-05-24 DIAGNOSIS — R809 Proteinuria, unspecified: Secondary | ICD-10-CM | POA: Diagnosis not present

## 2018-05-24 DIAGNOSIS — E875 Hyperkalemia: Secondary | ICD-10-CM | POA: Diagnosis not present

## 2018-05-24 DIAGNOSIS — R3911 Hesitancy of micturition: Secondary | ICD-10-CM | POA: Diagnosis not present

## 2018-05-24 DIAGNOSIS — D509 Iron deficiency anemia, unspecified: Secondary | ICD-10-CM | POA: Diagnosis not present

## 2018-05-24 DIAGNOSIS — N401 Enlarged prostate with lower urinary tract symptoms: Secondary | ICD-10-CM | POA: Diagnosis not present

## 2018-05-24 DIAGNOSIS — I129 Hypertensive chronic kidney disease with stage 1 through stage 4 chronic kidney disease, or unspecified chronic kidney disease: Secondary | ICD-10-CM | POA: Diagnosis not present

## 2018-05-24 DIAGNOSIS — E1122 Type 2 diabetes mellitus with diabetic chronic kidney disease: Secondary | ICD-10-CM | POA: Diagnosis not present

## 2018-05-24 DIAGNOSIS — N2581 Secondary hyperparathyroidism of renal origin: Secondary | ICD-10-CM | POA: Diagnosis not present

## 2018-05-24 DIAGNOSIS — Z8674 Personal history of sudden cardiac arrest: Secondary | ICD-10-CM | POA: Diagnosis not present

## 2018-05-24 DIAGNOSIS — N183 Chronic kidney disease, stage 3 (moderate): Secondary | ICD-10-CM | POA: Diagnosis not present

## 2018-05-24 DIAGNOSIS — M869 Osteomyelitis, unspecified: Secondary | ICD-10-CM | POA: Diagnosis not present

## 2018-05-24 DIAGNOSIS — Z8673 Personal history of transient ischemic attack (TIA), and cerebral infarction without residual deficits: Secondary | ICD-10-CM | POA: Diagnosis not present

## 2018-05-24 LAB — HEPATIC FUNCTION PANEL
ALT: 19 (ref 10–40)
AST: 13 — AB (ref 14–40)
Alkaline Phosphatase: 90 (ref 25–125)
Bilirubin, Total: 0.3

## 2018-05-24 LAB — IRON,TIBC AND FERRITIN PANEL
%SAT: 24
Ferritin: 321
Iron: 56
TIBC: 230
UIBC: 174

## 2018-05-24 LAB — CBC AND DIFFERENTIAL
HCT: 36 — AB (ref 41–53)
Hemoglobin: 12.1 — AB (ref 13.5–17.5)
Neutrophils Absolute: 7
Platelets: 189 (ref 150–399)
WBC: 9.5

## 2018-05-24 LAB — BASIC METABOLIC PANEL
BUN: 60 — AB (ref 4–21)
Creatinine: 2.2 — AB (ref 0.6–1.3)
Glucose: 255
Potassium: 4.2 (ref 3.4–5.3)
Sodium: 137 (ref 137–147)

## 2018-05-24 LAB — VITAMIN D 25 HYDROXY (VIT D DEFICIENCY, FRACTURES): Vit D, 25-Hydroxy: 63.9

## 2018-05-29 ENCOUNTER — Telehealth: Payer: Self-pay | Admitting: Nutrition

## 2018-05-29 NOTE — Telephone Encounter (Signed)
Returning his message that it is time to upgrade his medtronic pump.  Left message to call me to see if he is wishing a new Medtronic, or would like to order a different one.  Telephone given to call to schedule appointment if he wants to see new pumps

## 2018-05-30 ENCOUNTER — Encounter: Payer: Self-pay | Admitting: Family Medicine

## 2018-05-30 ENCOUNTER — Ambulatory Visit (INDEPENDENT_AMBULATORY_CARE_PROVIDER_SITE_OTHER): Payer: Medicare Other | Admitting: Family Medicine

## 2018-05-30 VITALS — BP 140/72 | HR 67 | Temp 98.8°F | Ht 76.0 in | Wt 280.8 lb

## 2018-05-30 DIAGNOSIS — E1159 Type 2 diabetes mellitus with other circulatory complications: Secondary | ICD-10-CM | POA: Diagnosis not present

## 2018-05-30 DIAGNOSIS — D692 Other nonthrombocytopenic purpura: Secondary | ICD-10-CM

## 2018-05-30 DIAGNOSIS — I10 Essential (primary) hypertension: Secondary | ICD-10-CM

## 2018-05-30 DIAGNOSIS — E785 Hyperlipidemia, unspecified: Secondary | ICD-10-CM | POA: Diagnosis not present

## 2018-05-30 DIAGNOSIS — E1169 Type 2 diabetes mellitus with other specified complication: Secondary | ICD-10-CM | POA: Diagnosis not present

## 2018-05-30 DIAGNOSIS — I421 Obstructive hypertrophic cardiomyopathy: Secondary | ICD-10-CM

## 2018-05-30 DIAGNOSIS — Z9989 Dependence on other enabling machines and devices: Secondary | ICD-10-CM

## 2018-05-30 DIAGNOSIS — E538 Deficiency of other specified B group vitamins: Secondary | ICD-10-CM | POA: Diagnosis not present

## 2018-05-30 DIAGNOSIS — I152 Hypertension secondary to endocrine disorders: Secondary | ICD-10-CM

## 2018-05-30 DIAGNOSIS — N184 Chronic kidney disease, stage 4 (severe): Secondary | ICD-10-CM

## 2018-05-30 DIAGNOSIS — R5383 Other fatigue: Secondary | ICD-10-CM

## 2018-05-30 DIAGNOSIS — G4733 Obstructive sleep apnea (adult) (pediatric): Secondary | ICD-10-CM | POA: Diagnosis not present

## 2018-05-30 MED ORDER — CYANOCOBALAMIN 1000 MCG/ML IJ SOLN
1000.0000 ug | Freq: Once | INTRAMUSCULAR | Status: AC
  Administered 2018-05-30: 1000 ug via INTRAMUSCULAR

## 2018-05-30 NOTE — Assessment & Plan Note (Addendum)
Per nephrology.  Last BMET stable.

## 2018-05-30 NOTE — Progress Notes (Signed)
   Subjective:  David [name] is a 78 y.o. male who presents today with a chief complaint of fatigue follow up.   HPI:  Fatigue, chronic problem Last seen about 6 weeks ago for this. Had blood work was showed low-normal B12, unremarkable.  Started on B12 replacement injections.  Has not made significant improvement in symptoms since then.  His stable, chronic medical conditions are outlined below:  # HOCM / CAD s/p PCI 2012 / PAD / HLD / HTN - Follows with cardiology - Currently on Lipitor 10 mg daily, Coreg 25 mg twice daily, Plavix 75 mg daily, hydralazine 10 mg 3 times daily, Imdur 30 mg daily, and torsemide 10mg  twice daily  # Afib s/ pacemaker placement 2017 -Follows with cardiology  # OSA on CPAP -Doing well on CPAP  # CKD 4 -Follows with nephrology  # IDDM -Follows with endocrinology.  On insulin pump.  ROS: Per HPI  PMH: He reports that he quit smoking about 34 years ago. He has a 60.00 pack-year smoking history. He has never used smokeless tobacco. He reports current alcohol use of about 1.0 standard drinks of alcohol per week. He reports that he does not use drugs.  Objective:  Physical Exam: BP 140/72 (BP Location: Left Arm, Patient Position: Sitting, Cuff Size: Large)   Pulse 67   Temp 98.8 F (37.1 C) (Oral)   Ht 6\' 4"  (1.93 m)   Wt 280 lb 12 oz (127.3 kg)   SpO2 99%   BMI 34.17 kg/m   Wt Readings from Last 3 Encounters:  05/30/18 280 lb 12 oz (127.3 kg)  04/09/18 280 lb 6.4 oz (127.2 kg)  04/06/18 280 lb (127 kg)  Gen: NAD, resting comfortably CV: RRR with no murmurs appreciated Pulm: NWOB, CTAB with no crackles, wheezes, or rhonchi MSK: No edema, cyanosis, or clubbing noted  Assessment/Plan:  Senile purpura (HCC) Stable.  Secondary to Plavix.  OSA on CPAP DME order written for CPAP supplies.  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HCC) Stable.  No signs of volume overload.  Continue current medication regimen from cardiology.  Morbid  obesity ([place]) Weight is stable.  Low vitamin B12 level Continue B12 replacement.  Hypertension associated with diabetes ([place]) At goal today.  Continue Coreg 25 mg twice daily, hydralazine 10 mg 3 times daily, and Imdur 30 mg daily.  Hyperlipidemia associated with type 2 diabetes mellitus (HCC) Last LDL 106.  Continue Lipitor 10 mg daily.  Fatigue Multifactorial in setting of HOCM, CKD, OSA on CPAP, B12 deficiency, anemia, and medications.  We are replacing B12 as noted above.  Recommended incremental exercise to improve endurance and energy levels.  CKD (chronic kidney disease) stage 4, GFR 15-29 ml/min [facility]) Per nephrology.  Last BMET stable.   [name], MD 05/30/2018 12:34 PM

## 2018-05-30 NOTE — Assessment & Plan Note (Signed)
Stable.  Secondary to Plavix.

## 2018-05-30 NOTE — Assessment & Plan Note (Signed)
Stable.  No signs of volume overload.  Continue current medication regimen from cardiology.

## 2018-05-30 NOTE — Assessment & Plan Note (Signed)
Continue B12 replacement. ?

## 2018-05-30 NOTE — Assessment & Plan Note (Signed)
At goal today.  Continue Coreg 25 mg twice daily, hydralazine 10 mg 3 times daily, and Imdur 30 mg daily.

## 2018-05-30 NOTE — Assessment & Plan Note (Signed)
Last LDL 106.  Continue Lipitor 10 mg daily.

## 2018-05-30 NOTE — Assessment & Plan Note (Signed)
Multifactorial in setting of HOCM, CKD, OSA on CPAP, B12 deficiency, anemia, and medications.  We are replacing B12 as noted above.  Recommended incremental exercise to improve endurance and energy levels.

## 2018-05-30 NOTE — Patient Instructions (Signed)
It was very nice to see you today!  I will write an order for your CPAP supplies.  Talk to your heart doctor about the possibilities of your meds contributing to your fatigue.   Please try small increments of activity to see if this helps.  Come back to see me in 6 months or sooner as needed.   Take care, Dr [name]

## 2018-05-30 NOTE — Assessment & Plan Note (Signed)
Weight is stable

## 2018-05-30 NOTE — Assessment & Plan Note (Signed)
DME order written for CPAP supplies.

## 2018-06-04 ENCOUNTER — Ambulatory Visit: Payer: Medicare Other | Admitting: Orthotics

## 2018-06-04 DIAGNOSIS — R600 Localized edema: Secondary | ICD-10-CM

## 2018-06-04 DIAGNOSIS — Z89421 Acquired absence of other right toe(s): Secondary | ICD-10-CM

## 2018-06-04 DIAGNOSIS — E1142 Type 2 diabetes mellitus with diabetic polyneuropathy: Secondary | ICD-10-CM

## 2018-06-04 DIAGNOSIS — Z899 Acquired absence of limb, unspecified: Secondary | ICD-10-CM

## 2018-06-04 DIAGNOSIS — M2042 Other hammer toe(s) (acquired), left foot: Secondary | ICD-10-CM

## 2018-06-04 NOTE — Progress Notes (Signed)

## 2018-06-05 ENCOUNTER — Encounter: Payer: Self-pay | Admitting: Physical Therapy

## 2018-06-05 DIAGNOSIS — I129 Hypertensive chronic kidney disease with stage 1 through stage 4 chronic kidney disease, or unspecified chronic kidney disease: Secondary | ICD-10-CM | POA: Diagnosis not present

## 2018-06-09 ENCOUNTER — Other Ambulatory Visit: Payer: Self-pay | Admitting: Family Medicine

## 2018-06-09 ENCOUNTER — Other Ambulatory Visit: Payer: Self-pay | Admitting: Endocrinology

## 2018-06-12 ENCOUNTER — Encounter: Payer: Medicare Other | Attending: Endocrinology | Admitting: Nutrition

## 2018-06-12 DIAGNOSIS — E785 Hyperlipidemia, unspecified: Secondary | ICD-10-CM | POA: Insufficient documentation

## 2018-06-12 DIAGNOSIS — E1169 Type 2 diabetes mellitus with other specified complication: Secondary | ICD-10-CM | POA: Insufficient documentation

## 2018-06-12 DIAGNOSIS — E1165 Type 2 diabetes mellitus with hyperglycemia: Secondary | ICD-10-CM

## 2018-06-12 NOTE — Progress Notes (Signed)
Patient says he is ready for a pump upgrade.  He and his wife were shown all three insulin pumps, and CGMs, and we discussed advantages and disadvantages of each of the 3 models.  He wants the CGM, and decided on the [employer] insulin pump.  Paperwork was filled out and faxed to [employer] and [employer].  He was told to call when his pump comes in for training.

## 2018-06-12 NOTE — Patient Instructions (Signed)
Call when your pump arrives, to schedule an appointment for training.

## 2018-06-19 ENCOUNTER — Other Ambulatory Visit: Payer: Self-pay

## 2018-06-19 MED ORDER — INSULIN REGULAR HUMAN (CONC) 500 UNIT/ML ~~LOC~~ SOLN: SUBCUTANEOUS | 0 refills | Status: DC

## 2018-07-04 ENCOUNTER — Ambulatory Visit (INDEPENDENT_AMBULATORY_CARE_PROVIDER_SITE_OTHER): Payer: Medicare Other

## 2018-07-04 DIAGNOSIS — E538 Deficiency of other specified B group vitamins: Secondary | ICD-10-CM

## 2018-07-04 MED ORDER — CYANOCOBALAMIN 1000 MCG/ML IJ SOLN
1000.0000 ug | Freq: Once | INTRAMUSCULAR | Status: AC
  Administered 2018-07-04: 1000 ug via INTRAMUSCULAR

## 2018-07-04 NOTE — Progress Notes (Signed)
Per orders of Dr. [name], injection of B12 given by Franco Collet. Patient tolerated injection well.

## 2018-07-04 NOTE — Patient Instructions (Signed)
Health Maintenance Due  Topic Date Due  . TETANUS/TDAP  08/21/1959  . PNA vac Low Risk Adult (1 of 2 - PCV13) 08/20/2005  . OPHTHALMOLOGY EXAM  10/21/2017    Depression screen [facility] 2/9 09/25/2017 08/17/2017 06/22/2017  Decreased Interest 0 0 0  Down, Depressed, Hopeless 0 0 0  PHQ - 2 Score 0 0 0  Altered sleeping 0 - -  Tired, decreased energy 0 - -  Change in appetite 0 - -  Feeling bad or failure about yourself  1 - -  Trouble concentrating 0 - -  Moving slowly or fidgety/restless 0 - -  Suicidal thoughts 0 - -  PHQ-9 Score 1 - -  Some recent data might be hidden

## 2018-07-10 DIAGNOSIS — H26491 Other secondary cataract, right eye: Secondary | ICD-10-CM | POA: Diagnosis not present

## 2018-07-10 LAB — HM DIABETES EYE EXAM

## 2018-07-17 ENCOUNTER — Other Ambulatory Visit: Payer: Self-pay

## 2018-07-17 ENCOUNTER — Other Ambulatory Visit (INDEPENDENT_AMBULATORY_CARE_PROVIDER_SITE_OTHER): Payer: Medicare Other

## 2018-07-17 DIAGNOSIS — E1165 Type 2 diabetes mellitus with hyperglycemia: Secondary | ICD-10-CM

## 2018-07-17 DIAGNOSIS — E1149 Type 2 diabetes mellitus with other diabetic neurological complication: Secondary | ICD-10-CM

## 2018-07-17 DIAGNOSIS — E782 Mixed hyperlipidemia: Secondary | ICD-10-CM | POA: Diagnosis not present

## 2018-07-17 DIAGNOSIS — IMO0002 Reserved for concepts with insufficient information to code with codable children: Secondary | ICD-10-CM

## 2018-07-17 DIAGNOSIS — Z794 Long term (current) use of insulin: Secondary | ICD-10-CM

## 2018-07-17 DIAGNOSIS — R5383 Other fatigue: Secondary | ICD-10-CM | POA: Diagnosis not present

## 2018-07-17 DIAGNOSIS — E538 Deficiency of other specified B group vitamins: Secondary | ICD-10-CM

## 2018-07-17 LAB — COMPREHENSIVE METABOLIC PANEL
ALT: 28 U/L (ref 0–53)
AST: 20 U/L (ref 0–37)
Albumin: 4 g/dL (ref 3.5–5.2)
Alkaline Phosphatase: 86 U/L (ref 39–117)
BUN: 42 mg/dL — ABNORMAL HIGH (ref 6–23)
CO2: 25 mEq/L (ref 19–32)
Calcium: 9.5 mg/dL (ref 8.4–10.5)
Chloride: 106 mEq/L (ref 96–112)
Creatinine, Ser: 2.07 mg/dL — ABNORMAL HIGH (ref 0.40–1.50)
GFR: 31.22 mL/min — ABNORMAL LOW (ref >60.00)
Glucose, Bld: 104 mg/dL — ABNORMAL HIGH (ref 70–99)
Potassium: 4.5 mEq/L (ref 3.5–5.1)
Sodium: 139 mEq/L (ref 135–145)
Total Bilirubin: 0.4 mg/dL (ref 0.2–1.2)
Total Protein: 6.9 g/dL (ref 6.0–8.3)

## 2018-07-17 LAB — LIPID PANEL
Cholesterol: 122 mg/dL (ref 0–200)
HDL: 25.8 mg/dL — ABNORMAL LOW (ref >39.00)
LDL Cholesterol: 62 mg/dL (ref 0–99)
NonHDL: 95.9
Total CHOL/HDL Ratio: 5
Triglycerides: 168 mg/dL — ABNORMAL HIGH (ref 0.0–149.0)
VLDL: 33.6 mg/dL (ref 0.0–40.0)

## 2018-07-17 LAB — TSH: TSH: 2.38 u[IU]/mL (ref 0.35–4.50)

## 2018-07-17 LAB — HEMOGLOBIN A1C: Hgb A1c MFr Bld: 7.9 % — ABNORMAL HIGH (ref 4.6–6.5)

## 2018-07-17 LAB — VITAMIN B12: Vitamin B-12: 884 pg/mL (ref 211–911)

## 2018-07-18 LAB — FRUCTOSAMINE: Fructosamine: 316 umol/L — ABNORMAL HIGH (ref 0–285)

## 2018-07-20 ENCOUNTER — Ambulatory Visit (INDEPENDENT_AMBULATORY_CARE_PROVIDER_SITE_OTHER): Payer: Medicare Other | Admitting: Endocrinology

## 2018-07-20 ENCOUNTER — Encounter: Payer: Self-pay | Admitting: Endocrinology

## 2018-07-20 ENCOUNTER — Other Ambulatory Visit: Payer: Self-pay

## 2018-07-20 VITALS — BP 130/62 | HR 75 | Ht 76.0 in | Wt 288.6 lb

## 2018-07-20 DIAGNOSIS — Z794 Long term (current) use of insulin: Secondary | ICD-10-CM | POA: Diagnosis not present

## 2018-07-20 DIAGNOSIS — E1165 Type 2 diabetes mellitus with hyperglycemia: Secondary | ICD-10-CM

## 2018-07-20 DIAGNOSIS — N183 Chronic kidney disease, stage 3 unspecified: Secondary | ICD-10-CM

## 2018-07-20 DIAGNOSIS — E782 Mixed hyperlipidemia: Secondary | ICD-10-CM

## 2018-07-20 NOTE — Progress Notes (Signed)
Patient ID: David [name], male   DOB: [birth year], 78 y.o.   MRN: [id number]    Reason for Appointment: Followup for Type 2 Diabetes  History of Present Illness:          Diagnosis: Type 2 diabetes mellitus, date of diagnosis:   1992       Past history:  He was initially treated with metformin and at some point also glipizide. Apparently he was started on insulin in 1994 approximately because of poor control He has been on various insulin regimens over the last several years However even with insulin he has had poor control for at least the last 7 or 8 years. He does not know what his previous A1c levels have been. He had been continued on metformin and glipizide but metformin stopped because of kidney function abnormality He had been taking Lantus 60 units twice a day with NovoLog previously and also Before his initial consultation in 6/15 he was on NovoLog twice a day and Humalog mix insulin  Because of poor control and large insulin doses he was started on Victoza in 6/15  Since 04/28/14 he had been on a Medtronic insulin pump because of persistent poor control and high insulin requirement  Recent history:   Medtronic 723 PUMP  regimen with U-500 insulin as follows  BASAL rates:Midnight = 0.5, 6 AM = 0.8, 1 PM = 0.85, 9 PM = 0.60    I/C ratio: 12, ISF: 60, target 150-150.  Active insulin 6 hours   Has had A1c's of mostly over 8%, now 7.9   Current management, problems and blood sugar patterns:  His blood sugars have been somewhat inconsistent  Seems to have usually persistently high readings averaging between about 160-170 most of the time including overnight  He did have an episode of hypoglycemia however in the morning which was preceded by an episode of gastroenteritis when he was eating less and had taken a bolus late at night the night before with entering with 60 g carbohydrate  Only occasionally will have good blood sugar meter at breakfast or  dinnertime and not clear what causes this fluctuation  FASTING blood sugars are also mildly increased but variable with blood sugars as low as 93 fasting  Mealtime insulin: He has several missing boluses both in the morning and evening  As before he is usually not checking his blood sugars supper and data about his postprandial readings is not usually available  Also does not appear to be entering his blood sugar in the pump consistently when they are high for correction boluses  HIGHEST blood sugars are on an average late at night  He is not eligible to get his new pump until the end of the year  Non-insulin hypoglycemic drugs the patient is taking are: VICTOZA 1.2 mg daily        Glucose monitoring:  done about 4 times a day         Glucometer:  contour freestyle Libre     Blood Glucose readings from download of meter and analysis of statistics as follows  CGM use % of time  72  2-week average/SD  164  Time in range     62   %, was 177  % Time Above 180  37  % Time above 250 0  % Time Below 70 1     PRE-MEAL Fasting Lunch Dinner  12 AM-2 AM Overall  Glucose range:  Averages:  156  164  161  177  164   POST-MEAL PC Breakfast PC Lunch PC Dinner  Glucose range:     Averages:   ?  162       Self-care:   Meals: 2- 3 meals per day.   breakfast is cheerios or egg and toast.  Will have half sandwich with soup at 1 pm , dinner at  6-7 pm.     Bedtime snack is usually crackers  with milk or have been admitted sandwich         Exercise:  Walking occasionally and exercising including on elliptical for up to 60 minutes, twice a week at the gym  Last  consultation : Most recent: 08/2017      Wt Readings from Last 3 Encounters:  07/20/18 288 lb 9.6 oz (130.9 kg)  05/30/18 280 lb 12 oz (127.3 kg)  04/09/18 280 lb 6.4 oz (127.2 kg)   Glycemic control:   Lab Results  Component Value Date   HGBA1C 7.9 (H) 07/17/2018   HGBA1C 8.1 (H) 04/02/2018   HGBA1C 8.3 (H) 10/24/2017    Lab Results  Component Value Date   MICROALBUR 11.9 (H) 10/24/2017   LDLCALC 62 07/17/2018   CREATININE 2.07 (H) 07/17/2018            Lab Results  Component Value Date   FRUCTOSAMINE 316 (H) 07/17/2018   FRUCTOSAMINE 319 (H) 12/27/2017   FRUCTOSAMINE 280 10/24/2017   FRUCTOSAMINE 315 (H) 07/21/2017     Other active problems: See review of systems   Allergies as of 07/20/2018      Reactions   Adhesive [tape] Other (See Comments)   blisters      Medication List       Accurate as of July 20, 2018  1:12 PM. Always use your most recent med list.        acetaminophen 325 MG tablet Commonly known as:  TYLENOL Take 1-2 tablets (325-650 mg total) by mouth every 4 (four) hours as needed for mild pain.   acetic acid 2 % otic solution INSTILL 4 DROPS INTO RIGHT EAR 4 TIMES DAILY FOR 7 DAYS   atorvastatin 10 MG tablet Commonly known as:  LIPITOR TAKE 1 TABLET BY MOUTH ONCE DAILY AT  6  PM   calcitRIOL 0.25 MCG capsule Commonly known as:  ROCALTROL TAKE 1 CAPSULE BY MOUTH ONCE DAILY   carvedilol 25 MG tablet Commonly known as:  COREG TAKE 1 TABLET BY MOUTH TWICE DAILY WITH A MEAL   cholecalciferol 1000 units tablet Commonly known as:  VITAMIN D Take 1 tablet (1,000 Units total) by mouth daily.   clopidogrel 75 MG tablet Commonly known as:  PLAVIX TAKE 1 TABLET BY MOUTH ONCE DAILY   diclofenac sodium 1 % Gel Commonly known as:  VOLTAREN Apply 2 g topically 3 (three) times daily.   FreeStyle Libre 14 Day Reader Kerrin Mo 1 each by Does not apply route daily. USE TO CHECK BLOOD SUGAR   FreeStyle Libre 14 Day Sensor Misc 1 each by Does not apply route daily. APPLY 1 SENSOR TO BODY ONCE EVERY 14 DAYS TO CHECK BLOOD SUGAR.   HUMULIN R 500 UNIT/ML injection Generic drug:  insulin regular human CONCENTRATED Inject into the skin.   insulin regular human CONCENTRATED 500 UNIT/ML injection Commonly known as:  HUMULIN R Use max 120 units daily or as directed in insulin  pump, Dx code E11.65   insulin regular human CONCENTRATED 500 UNIT/ML injection  Commonly known as:  HUMULIN R USE 0.25 ML DAILY OR AS DIRECTED IN INSULIN PUMP.DX:e11.65   hydrALAZINE 10 MG tablet Commonly known as:  APRESOLINE 10 mg 3 (three) times daily. Take 2 tablets by mouth 3 times daily.   isosorbide mononitrate 30 MG 24 hr tablet Commonly known as:  IMDUR Take 1 tablet (30 mg total) by mouth daily.   pantoprazole 40 MG tablet Commonly known as:  PROTONIX TAKE ONE TABLET BY MOUTH ONCE DAILY   sertraline 100 MG tablet Commonly known as:  ZOLOFT Take 100 mg by mouth daily.   torsemide 10 MG tablet Commonly known as:  DEMADEX TAKE 1 TABLET BY MOUTH TWICE DAILY   Victoza 18 MG/3ML Sopn Generic drug:  liraglutide INJECT 0.2MLS SUBCUTANEOUSLY ONCE DAILY   vitamin B-12 100 MCG tablet Commonly known as:  CYANOCOBALAMIN Take 100 mcg by mouth daily.       Allergies:  Allergies  Allergen Reactions  . Adhesive [Tape] Other (See Comments)    blisters    Past Medical History:  Diagnosis Date  . Acute cholecystitis s/p lap cholecystectomy 03/05/2017 03/04/2017  . Anemia, iron deficiency   . Anxiety   . Arthritis   . Atrial fibrillation with rapid ventricular response ([place])   . BPH (benign prostatic hypertrophy)   . CAD (coronary artery disease)    Nonobstructive CAD per cath  . Cardiac pacemaker in situ   . CHF (congestive heart failure) ([place])   . Chronic ulcer of right foot ([place])   . CKD (chronic kidney disease) stage 4, GFR 15-29 ml/min (H[facility]) 09/26/2017  . CKD (chronic kidney disease), stage III ([place]) secondary to DM and HTN   nephrologist-  [name]  . Coronary artery disease involving native coronary artery of native heart without angina pectoris   . Diastolic dysfunction   . Dyspnea   . 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 History of cellulitis    right great toe 10-25-2014  . History of skin cancer   .  HOCM (hypertrophic obstructive cardiomyopathy) ([place])   . Hyperlipidemia associated with type 2 diabetes mellitus ([place]) 10/25/2013  . Hypertension   . Hypertension associated with diabetes ([place]) 10/25/2013  . 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 Insulin dependent type 2 diabetes mellitus ([place]) 1991   followd by [name]--  has insulin pump  . Insulin pump in place   . OSA on CPAP   . 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 Peripheral neuropathy    severe  . Peripheral vascular disease ([place])    bilateral lower extremities  . PVD (peripheral vascular disease) ([place])   . Rib fracture 07/24/2015  . Seasonal and perennial allergic rhinitis 10/25/2013  . Seborrheic keratosis 09/19/2016  . Secondary hyperparathyroidism of renal origin ([place])   . 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 Sinus node dysfunction (H[facility])   . Sleep apnea   . Syncope 07/24/2015    Past Surgical History:  Procedure Laterality Date  . AMPUTATION OF REPLICATED TOES  Mar 1017   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)  . AMPUTATION TOE Right 03/12/2015   Procedure: RIGHT HALLUS AMPUTATION ;  Surgeon: [name], MD;  Location: [facility];  Service: Podiatry;  Laterality: Right;  . CARDIAC CATHETERIZATION  11-25-2010   [place], [place]   Nonobstructive CAD  . CARDIAC PACEMAKER PLACEMENT  Nov 2009   Medtronic  . CHOLECYSTECTOMY N/A 03/05/2017   Procedure: LAPAROSCOPIC CHOLECYSTECTOMY WITH INTRAOPERATIVE CHOLANGIOGRAM;  Surgeon: [name], MD;  Location: [facility];  Service: General;  Laterality: N/A;  . EP IMPLANTABLE DEVICE N/A 06/03/2015   Procedure: PPM Generator Changeout;  Surgeon: [name], MD;  Location: [facility];  Service: Cardiovascular;  Laterality: N/A;  . EXCISION BONE CYST Right 03/06/2015   Procedure: BONE BIOPSIES OF RIGHT FOOT;  Surgeon: [name], MD;  Location: [facility];  Service: Podiatry;  Laterality: Right;  . ORIF ANKLE FRACTURE Left 11/06/2014    Procedure: OPEN REDUCTION INTERNAL FIXATION (ORIF) LEFT  ANKLE FRACTURE;  Surgeon: [name], MD;  Location: [facility];  Service: Orthopedics;  Laterality: Left;  . TEE WITHOUT CARDIOVERSION N/A 09/01/2017   Procedure: TRANSESOPHAGEAL ECHOCARDIOGRAM (TEE);  Surgeon: [name], MD;  Location: [facility];  Service: Cardiovascular;  Laterality: N/A;  . TOTAL KNEE ARTHROPLASTY    . VEIN LIGATION AND STRIPPING      Family History  Problem Relation Age of Onset  . Cancer Mother        breast  . Heart attack Father   . Stroke Father     Social History:  reports that he quit smoking about 34 years ago. He has a 60.00 pack-year smoking history. He has never used smokeless tobacco. He reports current alcohol use of about 1.0 standard drinks of alcohol per week. He reports that he does not use drugs.    Review of Systems       Lipids: Has been on and off Lipitor since late 2014. Also had taken Gembrozil for several years without apparent side effects.  LDL is below 70 Previously had high ALT with Lipitor but not currently  HDL is consistently low  He is taking  fish oil OTC and recently triglycerides are below 200   Lab Results  Component Value Date   CHOL 122 07/17/2018   CHOL 123 12/27/2017   CHOL 165 08/31/2017   Lab Results  Component Value Date   HDL 25.80 (L) 07/17/2018   HDL 23.60 (L) 12/27/2017   HDL 20 (L) 08/31/2017   Lab Results  Component Value Date   LDLCALC 62 07/17/2018   LDLCALC 106 (H) 08/31/2017   LDLCALC 94 02/17/2017   Lab Results  Component Value Date   TRIG 168.0 (H) 07/17/2018   TRIG 281.0 (H) 12/27/2017   TRIG 195 (H) 08/31/2017   Lab Results  Component Value Date   CHOLHDL 5 07/17/2018   CHOLHDL 5 12/27/2017   CHOLHDL 8.3 08/31/2017   Lab Results  Component Value Date   LDLDIRECT 50.0 12/27/2017   LDLDIRECT 76.0 07/21/2017   LDLDIRECT 89.0 05/23/2017    Lab Results  Component Value Date   ALT 28 07/17/2018        HYPERTENSION: The blood pressure has been treated with various drugs including carvedilol 25 mg by his other physicians.  Blood pressure is somewhat variable  BP Readings from Last 3 Encounters:  07/20/18 130/62  05/30/18 140/72  04/09/18 132/72     Chronic kidney disease: Etiology unknown, is followed by nephrologist  Creatinine history:  Lab Results  Component Value Date   CREATININE 2.07 (H) 07/17/2018   CREATININE 2.2 (A) 05/24/2018   CREATININE 2.43 (H) 04/09/2018         Has history of Numbness in his feet  since about 2013  He has been wearing diabetic shoes.  He has been followed by podiatrist He is  under the care of a podiatrist   Vitamin  B12 deficiency: He has been back on monthly injections with better levels  Lab Results  Component Value Date   VITAMINB12 884 07/17/2018       Physical Examination:  BP 130/62 (BP Location: Left Arm, Patient Position: Sitting, Cuff Size: Large)   Pulse 75   Ht 6\' 4"  (1.93 m)   Wt 288 lb 9.6 oz (130.9 kg)   SpO2 98%   BMI 35.13 kg/m     ASSESSMENT:  Diabetes type 2, uncontrolled with obesity See history of present illness for detailed discussion of current diabetes management, blood sugar patterns and problems identified   He is on the U-500 insulin on his pump  Although his A1c is 7.9 his blood sugars are still not well controlled and in the last 2 weeks averaging higher than before  As before he has some variability in his blood sugars and not consistently high This may be partly related to using concentrated insulin Also with an episode of gastroenteritis and inconsistent intake he had a low blood sugar on waking up which is unusual Again discussed the need to be more consistent with glucose monitoring at all times, bolusing for all meals and snacks also taking correction boluses when blood sugars are high on his freestyle sensor   B12 deficiency: Currently treated adequately   LIPIDS: Has had  high triglycerides that are now under 200 and also LDL is at target He will continue Lipitor and OTC fish oil ALT is normal now consistently  PLAN:   Basal rate at midnight will be 0.55 will reduce the 6 AM basal rate to 0.75 Discussed day-to-day management of his diabetes as discussed above to optimize his monitoring and bolusing Balanced meals with some protein at each meal He will look into the Dexcom sensor since this may give him more complete information and proactively allow him to know when his blood sugars are going higher or lower basal rate changes were made in the office To try and bolus 30 minutes before eating as much as possible   Counseling time on subjects discussed in assessment and plan sections is over 50% of today's 25 minute visit    There are no Patient Instructions on file for this visit.    Elayne Snare 07/20/2018, 1:12 PM   Note: This office note was prepared with Dragon voice recognition system technology. Any transcriptional errors that result from this process are unintentional.

## 2018-07-27 ENCOUNTER — Ambulatory Visit: Payer: Medicare Other | Admitting: Orthotics

## 2018-07-27 ENCOUNTER — Other Ambulatory Visit: Payer: Self-pay

## 2018-07-27 DIAGNOSIS — Z89421 Acquired absence of other right toe(s): Secondary | ICD-10-CM | POA: Diagnosis not present

## 2018-07-27 DIAGNOSIS — E1151 Type 2 diabetes mellitus with diabetic peripheral angiopathy without gangrene: Secondary | ICD-10-CM | POA: Diagnosis not present

## 2018-07-27 DIAGNOSIS — M2041 Other hammer toe(s) (acquired), right foot: Secondary | ICD-10-CM

## 2018-08-13 ENCOUNTER — Other Ambulatory Visit: Payer: Self-pay

## 2018-08-13 ENCOUNTER — Ambulatory Visit (INDEPENDENT_AMBULATORY_CARE_PROVIDER_SITE_OTHER): Payer: Medicare Other | Admitting: *Deleted

## 2018-08-13 DIAGNOSIS — I495 Sick sinus syndrome: Secondary | ICD-10-CM

## 2018-08-14 ENCOUNTER — Telehealth: Payer: Self-pay

## 2018-08-14 NOTE — Telephone Encounter (Signed)
Spoke with patient to remind of missed remote transmission 

## 2018-08-15 LAB — CUP PACEART REMOTE DEVICE CHECK
Battery Remaining Longevity: 63 mo
Battery Voltage: 3 V
Brady Statistic AP VP Percent: 0.06 %
Brady Statistic AP VS Percent: 99.81 %
Brady Statistic AS VP Percent: 0 %
Brady Statistic AS VS Percent: 0.13 %
Brady Statistic RA Percent Paced: 99.75 %
Brady Statistic RV Percent Paced: 0.07 %
Date Time Interrogation Session: 20200401003133
Implantable Lead Implant Date: 20091102
Implantable Lead Implant Date: 20091102
Implantable Lead Location: 753859
Implantable Lead Location: 753860
Implantable Lead Model: 5076
Implantable Lead Model: 5076
Implantable Pulse Generator Implant Date: 20170118
Lead Channel Impedance Value: 1235 Ohm
Lead Channel Impedance Value: 456 Ohm
Lead Channel Impedance Value: 513 Ohm
Lead Channel Impedance Value: 646 Ohm
Lead Channel Pacing Threshold Amplitude: 1.875 V
Lead Channel Pacing Threshold Amplitude: 2.5 V
Lead Channel Pacing Threshold Pulse Width: 0.4 ms
Lead Channel Pacing Threshold Pulse Width: 0.4 ms
Lead Channel Sensing Intrinsic Amplitude: 1.25 mV
Lead Channel Sensing Intrinsic Amplitude: 1.25 mV
Lead Channel Sensing Intrinsic Amplitude: 27.75 mV
Lead Channel Sensing Intrinsic Amplitude: 27.75 mV
Lead Channel Setting Pacing Amplitude: 2.5 V
Lead Channel Setting Pacing Amplitude: 4.25 V
Lead Channel Setting Pacing Pulse Width: 0.4 ms
Lead Channel Setting Sensing Sensitivity: 2.8 mV

## 2018-08-20 NOTE — Progress Notes (Signed)
Remote pacemaker transmission.   

## 2018-08-21 ENCOUNTER — Ambulatory Visit: Payer: Medicare Other | Admitting: Podiatry

## 2018-08-29 ENCOUNTER — Telehealth: Payer: Self-pay

## 2018-08-29 NOTE — Telephone Encounter (Signed)
I called pts wife about visit be change to video visit due to COVID 19 . I stated we are not doing in office visit. I receive verbal consent from wife to do video and to file insurance. I gave wife webex to download on her phone that has a camera. I verified her email. I stated she will get a email with the visit information to join meeting. The meds were updated and pharmacy was verified.

## 2018-09-04 ENCOUNTER — Other Ambulatory Visit: Payer: Self-pay | Admitting: Endocrinology

## 2018-09-04 ENCOUNTER — Ambulatory Visit (INDEPENDENT_AMBULATORY_CARE_PROVIDER_SITE_OTHER): Payer: Medicare Other | Admitting: Adult Health

## 2018-09-04 ENCOUNTER — Other Ambulatory Visit: Payer: Self-pay

## 2018-09-04 ENCOUNTER — Encounter: Payer: Self-pay | Admitting: Adult Health

## 2018-09-04 DIAGNOSIS — E785 Hyperlipidemia, unspecified: Secondary | ICD-10-CM

## 2018-09-04 DIAGNOSIS — E1159 Type 2 diabetes mellitus with other circulatory complications: Secondary | ICD-10-CM | POA: Diagnosis not present

## 2018-09-04 DIAGNOSIS — I639 Cerebral infarction, unspecified: Secondary | ICD-10-CM

## 2018-09-04 DIAGNOSIS — I10 Essential (primary) hypertension: Secondary | ICD-10-CM | POA: Diagnosis not present

## 2018-09-04 DIAGNOSIS — Z794 Long term (current) use of insulin: Secondary | ICD-10-CM

## 2018-09-04 DIAGNOSIS — G4733 Obstructive sleep apnea (adult) (pediatric): Secondary | ICD-10-CM

## 2018-09-04 DIAGNOSIS — Z9989 Dependence on other enabling machines and devices: Secondary | ICD-10-CM | POA: Diagnosis not present

## 2018-09-04 NOTE — Progress Notes (Signed)
[facility] [address] [place]. [place] [address] [phone]       VIRTUAL VISIT FOLLOW UP NOTE  Mr. ALVINO LECHUGA Date of Birth:  03/12/1941 Medical Record Number:  [id number]   Reason for visit: Stroke follow up   Virtual Visit via Video Note  I connected with [name] on 09/04/18 at  1:15 PM EDT by a video enabled telemedicine application located remotely in my own home and verified that I am speaking with the correct person using two identifiers who was located at their own home and accompanied by his wife.   I discussed the limitations of evaluation and management by telemedicine and the availability of in person appointments. The patient expressed understanding and agreed to proceed.   HPI: RENARD CAPERTON is a 78 y.o.  male with underlying history of diabetes, hypertension, significant heart disease status post pacemaker placement and recent diagnosis of right frontal cortex posteriorly compatible with acute infarction on 08/30/17 after presenting with left sided weakness. He was initially scheduled for office follow up today but due to COVID-19 safety precautions, visit transitioned to telemedicine visit via WebEx. He has been doing well from a stroke standpoint with residual deficits of occasional balance difficulties. He will use a cane when out of the house.  No recent falls.   Continues on Plavix and atorvastatin without reported side effects.  Lipid panel 07/17/2018 LDL 62.  Blood pressure elevated at 184/84. Blood pressure has been elevated for the past 2 months per wife. He follows with nephrology with slowly increasing hydralazine with last increase approx 4 weeks prior.  Follow-up appointment with nephrology scheduled next week but rescheduled due to COVID-19 50 precautions.  Asymptomatic hypertension but does endorse increase fatigue over the past month.  Per wife, fatigue complaints extensively worked up by PCP which was unremarkable and felt  fatigue likely related to medication use.  History of OSA and reported compliance with CPAP.  Glucose levels avg 160.  A1c 7.9 on 07/17/2018. He follows with endocrinology.  His wife endorses worsening short-term memory such as having a conversation or being told something and forgetting shortly after.  This is been present for approximately the past month.  No further concerns at this time.  Denies new or worsening stroke/TIA symptoms.    ROS:   14 system review of systems performed and negative with exception of balance and fatigue  PMH:  Past Medical History:  Diagnosis Date  . Acute cholecystitis s/p lap cholecystectomy 03/05/2017 03/04/2017  . Anemia, iron deficiency   . Anxiety   . Arthritis   . Atrial fibrillation with rapid ventricular response ([place])   . BPH (benign prostatic hypertrophy)   . CAD (coronary artery disease)    Nonobstructive CAD per cath  . Cardiac pacemaker in situ   . CHF (congestive heart failure) ([place])   . Chronic ulcer of right foot ([place])   . CKD (chronic kidney disease) stage 4, GFR 15-29 ml/min (HCC) 09/26/2017  . CKD (chronic kidney disease), stage III ([place]) secondary to DM and HTN   nephrologist-  [name]  . Coronary artery disease involving native coronary artery of native heart without angina pectoris   . Diastolic dysfunction   . Dyspnea   . 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 History of cellulitis    right great toe 10-25-2014  . History of skin cancer   . HOCM (hypertrophic obstructive cardiomyopathy) ([place])   . Hyperlipidemia  associated with type 2 diabetes mellitus ([place]) 10/25/2013  . Hypertension   . Hypertension associated with diabetes ([place]) 10/25/2013  . 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 Insulin dependent type 2 diabetes mellitus ([place]) 1991   followd by [name]--  has insulin pump  . Insulin pump in place   . OSA on CPAP   . 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  Peripheral neuropathy    severe  . Peripheral vascular disease ([place])    bilateral lower extremities  . PVD (peripheral vascular disease) ([place])   . Rib fracture 07/24/2015  . Seasonal and perennial allergic rhinitis 10/25/2013  . Seborrheic keratosis 09/19/2016  . Secondary hyperparathyroidism of renal origin ([place])   . 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 Sinus node dysfunction (HCC)   . Sleep apnea   . Syncope 07/24/2015    PSH:  Past Surgical History:  Procedure Laterality Date  . AMPUTATION OF REPLICATED TOES  Mar 1884   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)  . AMPUTATION TOE Right 03/12/2015   Procedure: RIGHT HALLUS AMPUTATION ;  Surgeon: [name], MD;  Location: [facility];  Service: Podiatry;  Laterality: Right;  . CARDIAC CATHETERIZATION  11-25-2010   [place], [place]   Nonobstructive CAD  . CARDIAC PACEMAKER PLACEMENT  Nov 2009   [employer]  . CHOLECYSTECTOMY N/A 03/05/2017   Procedure: LAPAROSCOPIC CHOLECYSTECTOMY WITH INTRAOPERATIVE CHOLANGIOGRAM;  Surgeon: [name], MD;  Location: [facility];  Service: General;  Laterality: N/A;  . EP IMPLANTABLE DEVICE N/A 06/03/2015   Procedure: PPM Generator Changeout;  Surgeon: [name], MD;  Location: [facility];  Service: Cardiovascular;  Laterality: N/A;  . EXCISION BONE CYST Right 03/06/2015   Procedure: BONE BIOPSIES OF RIGHT FOOT;  Surgeon: [name], MD;  Location: [facility];  Service: Podiatry;  Laterality: Right;  . ORIF ANKLE FRACTURE Left 11/06/2014   Procedure: OPEN REDUCTION INTERNAL FIXATION (ORIF) LEFT  ANKLE FRACTURE;  Surgeon: [name], MD;  Location: [facility];  Service: Orthopedics;  Laterality: Left;  . TEE WITHOUT CARDIOVERSION N/A 09/01/2017   Procedure: TRANSESOPHAGEAL ECHOCARDIOGRAM (TEE);  Surgeon: [name], MD;  Location: [facility];  Service: Cardiovascular;  Laterality: N/A;  . TOTAL KNEE ARTHROPLASTY    . VEIN LIGATION AND STRIPPING       Social History:  Social History   Socioeconomic History  . Marital status: Married    Spouse name: Not on file  . Number of children: 3  . Years of education: 53  . Highest education level: Not on file  Occupational History  . Occupation: Retired  Scientific laboratory technician  . Financial resource strain: Not on file  . Food insecurity:    Worry: Not on file    Inability: Not on file  . Transportation needs:    Medical: Not on file    Non-medical: Not on file  Tobacco Use  . Smoking status: Former Smoker    Packs/day: 2.00    Years: 30.00    Pack years: 60.00    Last attempt to quit: 03/03/1984    Years since quitting: 34.5  . Smokeless tobacco: Never Used  Substance and Sexual Activity  . Alcohol use: Yes    Alcohol/week: 1.0 standard drinks    Types: 1 Glasses of wine per week    Comment: social  . Drug use: No  . Sexual activity: Not Currently  Lifestyle  . Physical activity:    Days per  week: Not on file    Minutes per session: Not on file  . Stress: Not on file  Relationships  . Social connections:    Talks on phone: Not on file    Gets together: Not on file    Attends religious service: Not on file    Active member of club or organization: Not on file    Attends meetings of clubs or organizations: Not on file    Relationship status: Not on file  . Intimate partner violence:    Fear of current or ex partner: Not on file    Emotionally abused: Not on file    Physically abused: Not on file    Forced sexual activity: Not on file  Other Topics Concern  . Not on file  Social History Narrative   Currently resides with his wife. 1 dog. Fun/Hobby: Golf    Denies any religious beliefs effecting health care.     Family History:  Family History  Problem Relation Age of Onset  . Cancer Mother        breast  . Heart attack Father   . Stroke Father     Medications:   Current Outpatient Medications on File Prior to Visit  Medication Sig Dispense Refill  . acetaminophen  (TYLENOL) 325 MG tablet Take 1-2 tablets (325-650 mg total) by mouth every 4 (four) hours as needed for mild pain.    Marland Kitchen acetic acid 2 % otic solution INSTILL 4 DROPS INTO RIGHT EAR 4 TIMES DAILY FOR 7 DAYS  0  . atorvastatin (LIPITOR) 10 MG tablet TAKE 1 TABLET BY MOUTH ONCE DAILY AT  6  PM 90 tablet 0  . calcitRIOL (ROCALTROL) 0.25 MCG capsule TAKE 1 CAPSULE BY MOUTH ONCE DAILY 90 capsule 0  . carvedilol (COREG) 25 MG tablet TAKE 1 TABLET BY MOUTH TWICE DAILY WITH A MEAL 180 tablet 0  . cholecalciferol (VITAMIN D) 1000 units tablet Take 1 tablet (1,000 Units total) by mouth daily. 30 tablet 1  . clopidogrel (PLAVIX) 75 MG tablet TAKE 1 TABLET BY MOUTH ONCE DAILY 90 tablet 1  . Continuous Blood Gluc Receiver (FREESTYLE LIBRE 14 DAY READER) DEVI 1 each by Does not apply route daily. USE TO CHECK BLOOD SUGAR 1 Device 0  . Continuous Blood Gluc Sensor (FREESTYLE LIBRE 14 DAY SENSOR) MISC 1 each by Does not apply route daily. APPLY 1 SENSOR TO BODY ONCE EVERY 14 DAYS TO CHECK BLOOD SUGAR. 9 each 3  . diclofenac sodium (VOLTAREN) 1 % GEL Apply 2 g topically 3 (three) times daily. 4 Tube 0  . hydrALAZINE (APRESOLINE) 10 MG tablet 10 mg 3 (three) times daily. Take 2 tablets by mouth 3 times daily.    . insulin regular human CONCENTRATED (HUMULIN R) 500 UNIT/ML injection Inject into the skin.    Marland Kitchen insulin regular human CONCENTRATED (HUMULIN R) 500 UNIT/ML injection Use max 120 units daily or as directed in insulin pump, Dx code E11.65 20 mL 1  . insulin regular human CONCENTRATED (HUMULIN R) 500 UNIT/ML injection USE 0.25 ML DAILY OR AS DIRECTED IN INSULIN PUMP.DX:e11.65 20 mL 0  . isosorbide mononitrate (IMDUR) 30 MG 24 hr tablet Take 1 tablet (30 mg total) by mouth daily. 30 tablet 0  . pantoprazole (PROTONIX) 40 MG tablet TAKE ONE TABLET BY MOUTH ONCE DAILY 90 tablet 1  . sertraline (ZOLOFT) 100 MG tablet Take 100 mg by mouth daily.    Marland Kitchen torsemide (DEMADEX) 10 MG tablet TAKE 1 TABLET  BY MOUTH TWICE DAILY  180 tablet 1  . VICTOZA 18 MG/3ML SOPN INJECT 0.2MLS SUBCUTANEOUSLY ONCE DAILY 6 pen 3  . vitamin B-12 (CYANOCOBALAMIN) 100 MCG tablet Take 100 mcg by mouth daily.     No current facility-administered medications on file prior to visit.     Allergies:   Allergies  Allergen Reactions  . Adhesive [Tape] Other (See Comments)    blisters     Physical Exam  General: Pleasant obese elderly Caucasian male, seated, in no evident distress Head: head normocephalic and atraumatic.     Neurologic Exam Mental Status: Awake and fully alert. Oriented to place and time. Recent and remote memory intact. Attention span, concentration and fund of knowledge appropriate. Mood and affect appropriate.  Cranial Nerves: Extraocular movements full without nystagmus. Hearing intact to voice. Face, tongue, palate moves normally and symmetrically.  Motor: No evidence of weakness project assessment Sensory.: intact to touch , pinprick , position and vibratory sensation with assistance from wife.  Coordination: Rapid alternating movements normal in all extremities. Finger-to-nose and heel-to-shin performed accurately bilaterally.   Gait and Station: Arises from chair without difficulty. Stance is normal. Gait demonstrates wide-based ambulation without assistive device Reflexes: UTA     Diagnostic Data (Labs, Imaging, Testing)  Lipid Panel     Component Value Date/Time   CHOL 122 07/17/2018 1110   TRIG 168.0 (H) 07/17/2018 1110   HDL 25.80 (L) 07/17/2018 1110   CHOLHDL 5 07/17/2018 1110   VLDL 33.6 07/17/2018 1110   LDLCALC 62 07/17/2018 1110   LDLDIRECT 50.0 12/27/2017 1322   A1c 7.9 (07/17/2018)   ASSESSMENT: KAZDEN LARGO is a 78 y.o. year old male here with 2 small right frontal cortical infarcts on 08/30/2017 secondary to cryptogenic source. Vascular risk factors include OSA, DM, HTN and HLD.   He has been stable from a stroke standpoint with residual deficits of occasional balance  difficulties.    PLAN: -Continue clopidogrel 75 mg daily  and Lipitor for secondary stroke prevention -F/u with PCP regarding your HLD management -continues to wear CPAP nightly for OSA management  -Continue to follow with endocrinology for DM management -f/u with nephrologist as scheduled and advised to contact office in regards to ongoing elevated blood pressure with potential need of increasing dose or additional agent -Patient was advised to monitor blood pressure at home and if continues to remain elevated or experience symptoms with elevation such as headache, visual changes, chest pain or dizziness that he should proceed to ED for further evaluation and management -Complaints of fatigue are most likely due to recently increasing antihypertensives as all other work-up negative and compliant with CPAP.  Advised him that unfortunately a lot of antihypertensives can cause fatigue especially during the adjustment period but he should speak to nephrologist regarding this concern.  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  Advised wife if she notices worsening, to follow-up with PCP for formal evaluation.  He was also advised to do mind exercises such as word search, crossword puzzles, bridge and reading -Maintain strict control of hypertension with blood pressure goal below 130/90, diabetes with hemoglobin A1c goal below 6.5% and cholesterol with LDL cholesterol (bad cholesterol) goal below 70 mg/dL. I also advised the patient to eat a healthy diet with plenty of whole grains, cereals, fruits and vegetables, exercise regularly and maintain ideal body weight.  Stable from stroke standpoint and followed by numerous providers for stroke risk factor management and recommend follow-up  as needed but to call office with any questions or concerns regarding stroke management   Greater than 50% of time during this 40 minute visit was spent on  counseling,explanation of diagnosis of 2 small right frontal cortical infarcts, reviewing risk factor management of uncontrolled HTN, HLD, DM and OSA, discussion regarding fatigue and memory loss, planning of further management, discussion with patient and family and coordination of care    Venancio Poisson, [facility]  [facility] [address] [place], [place] [address]  Phone [phone] Fax [phone]

## 2018-09-04 NOTE — Progress Notes (Signed)
I agree with the above plan 

## 2018-09-05 ENCOUNTER — Other Ambulatory Visit: Payer: Self-pay

## 2018-09-05 MED ORDER — INSULIN REGULAR HUMAN (CONC) 500 UNIT/ML ~~LOC~~ SOLN: SUBCUTANEOUS | 0 refills | Status: DC

## 2018-09-13 ENCOUNTER — Other Ambulatory Visit: Payer: Self-pay | Admitting: Family Medicine

## 2018-09-17 ENCOUNTER — Other Ambulatory Visit: Payer: Self-pay | Admitting: Endocrinology

## 2018-09-17 ENCOUNTER — Other Ambulatory Visit: Payer: Self-pay | Admitting: Family

## 2018-09-17 ENCOUNTER — Other Ambulatory Visit: Payer: Self-pay | Admitting: Internal Medicine

## 2018-09-18 ENCOUNTER — Other Ambulatory Visit: Payer: Self-pay | Admitting: Family

## 2018-09-18 ENCOUNTER — Other Ambulatory Visit: Payer: Self-pay | Admitting: Family Medicine

## 2018-09-18 NOTE — Telephone Encounter (Signed)
Called pt and spoke with his wife. Wife states that the pt has not been taking it and the pharmacy must have done this  By mistake. Pt's wife stated that they have been checking blood pressure BID and working with another MD to manage this.

## 2018-09-18 NOTE — Telephone Encounter (Signed)
Do you want to refill this ? 

## 2018-09-18 NOTE — Telephone Encounter (Signed)
Doxazosin appears to have been stopped on a hospital discharge and need to know if he has been taking it regularly recently.  Has he checked his blood pressure

## 2018-10-01 ENCOUNTER — Other Ambulatory Visit: Payer: Self-pay

## 2018-10-01 ENCOUNTER — Ambulatory Visit (INDEPENDENT_AMBULATORY_CARE_PROVIDER_SITE_OTHER): Payer: Medicare Other | Admitting: Podiatry

## 2018-10-01 ENCOUNTER — Encounter: Payer: Self-pay | Admitting: Podiatry

## 2018-10-01 VITALS — BP 180/82

## 2018-10-01 DIAGNOSIS — E1142 Type 2 diabetes mellitus with diabetic polyneuropathy: Secondary | ICD-10-CM

## 2018-10-01 DIAGNOSIS — B351 Tinea unguium: Secondary | ICD-10-CM | POA: Diagnosis not present

## 2018-10-01 DIAGNOSIS — S91209A Unspecified open wound of unspecified toe(s) with damage to nail, initial encounter: Secondary | ICD-10-CM | POA: Diagnosis not present

## 2018-10-01 DIAGNOSIS — Z89429 Acquired absence of other toe(s), unspecified side: Secondary | ICD-10-CM

## 2018-10-01 MED ORDER — MUPIROCIN 2 % EX OINT: TOPICAL_OINTMENT | CUTANEOUS | 0 refills | Status: AC

## 2018-10-01 NOTE — Patient Instructions (Signed)
Diabetes Mellitus and Foot Care  Foot care is an important part of your health, especially when you have diabetes. Diabetes may cause you to have problems because of poor blood flow (circulation) to your feet and legs, which can cause your skin to:   Become thinner and drier.   Break more easily.   Heal more slowly.   Peel and crack.  You may also have nerve damage (neuropathy) in your legs and feet, causing decreased feeling in them. This means that you may not notice minor injuries to your feet that could lead to more serious problems. Noticing and addressing any potential problems early is the best way to prevent future foot problems.  How to care for your feet  Foot hygiene   Wash your feet daily with warm water and mild soap. Do not use hot water. Then, pat your feet and the areas between your toes until they are completely dry. Do not soak your feet as this can dry your skin.   Trim your toenails straight across. Do not dig under them or around the cuticle. File the edges of your nails with an emery board or nail file.   Apply a moisturizing lotion or petroleum jelly to the skin on your feet and to dry, brittle toenails. Use lotion that does not contain alcohol and is unscented. Do not apply lotion between your toes.  Shoes and socks   Wear clean socks or stockings every day. Make sure they are not too tight. Do not wear knee-high stockings since they may decrease blood flow to your legs.   Wear shoes that fit properly and have enough cushioning. Always look in your shoes before you put them on to be sure there are no objects inside.   To break in new shoes, wear them for just a few hours a day. This prevents injuries on your feet.  Wounds, scrapes, corns, and calluses   Check your feet daily for blisters, cuts, bruises, sores, and redness. If you cannot see the bottom of your feet, use a mirror or ask someone for help.   Do not cut corns or calluses or try to remove them with medicine.   If you  find a minor scrape, cut, or break in the skin on your feet, keep it and the skin around it clean and dry. You may clean these areas with mild soap and water. Do not clean the area with peroxide, alcohol, or iodine.   If you have a wound, scrape, corn, or callus on your foot, look at it several times a day to make sure it is healing and not infected. Check for:  ? Redness, swelling, or pain.  ? Fluid or blood.  ? Warmth.  ? Pus or a bad smell.  General instructions   Do not cross your legs. This may decrease blood flow to your feet.   Do not use heating pads or hot water bottles on your feet. They may burn your skin. If you have lost feeling in your feet or legs, you may not know this is happening until it is too late.   Protect your feet from hot and cold by wearing shoes, such as at the beach or on hot pavement.   Schedule a complete foot exam at least once a year (annually) or more often if you have foot problems. If you have foot problems, report any cuts, sores, or bruises to your health care provider immediately.  Contact a health care provider if:     You have a medical condition that increases your risk of infection and you have any cuts, sores, or bruises on your feet.   You have an injury that is not healing.   You have redness on your legs or feet.   You feel burning or tingling in your legs or feet.   You have pain or cramps in your legs and feet.   Your legs or feet are numb.   Your feet always feel cold.   You have pain around a toenail.  Get help right away if:   You have a wound, scrape, corn, or callus on your foot and:  ? You have pain, swelling, or redness that gets worse.  ? You have fluid or blood coming from the wound, scrape, corn, or callus.  ? Your wound, scrape, corn, or callus feels warm to the touch.  ? You have pus or a bad smell coming from the wound, scrape, corn, or callus.  ? You have a fever.  ? You have a red line going up your leg.  Summary   Check your feet every day  for cuts, sores, red spots, swelling, and blisters.   Moisturize feet and legs daily.   Wear shoes that fit properly and have enough cushioning.   If you have foot problems, report any cuts, sores, or bruises to your health care provider immediately.   Schedule a complete foot exam at least once a year (annually) or more often if you have foot problems.  This information is not intended to replace advice given to you by your health care provider. Make sure you discuss any questions you have with your health care provider.  Document Released: 04/29/2000 Document Revised: 06/14/2017 Document Reviewed: 06/03/2016  Elsevier Interactive Patient Education  2019 Elsevier Inc[employer]

## 2018-10-02 ENCOUNTER — Ambulatory Visit: Payer: Medicare Other | Admitting: Podiatry

## 2018-10-03 DIAGNOSIS — Z8673 Personal history of transient ischemic attack (TIA), and cerebral infarction without residual deficits: Secondary | ICD-10-CM | POA: Diagnosis not present

## 2018-10-03 DIAGNOSIS — I129 Hypertensive chronic kidney disease with stage 1 through stage 4 chronic kidney disease, or unspecified chronic kidney disease: Secondary | ICD-10-CM | POA: Diagnosis not present

## 2018-10-03 DIAGNOSIS — D509 Iron deficiency anemia, unspecified: Secondary | ICD-10-CM | POA: Diagnosis not present

## 2018-10-03 DIAGNOSIS — E1122 Type 2 diabetes mellitus with diabetic chronic kidney disease: Secondary | ICD-10-CM | POA: Diagnosis not present

## 2018-10-03 DIAGNOSIS — N183 Chronic kidney disease, stage 3 (moderate): Secondary | ICD-10-CM | POA: Diagnosis not present

## 2018-10-03 DIAGNOSIS — Z8674 Personal history of sudden cardiac arrest: Secondary | ICD-10-CM | POA: Diagnosis not present

## 2018-10-03 DIAGNOSIS — F419 Anxiety disorder, unspecified: Secondary | ICD-10-CM | POA: Diagnosis not present

## 2018-10-03 DIAGNOSIS — N2581 Secondary hyperparathyroidism of renal origin: Secondary | ICD-10-CM | POA: Diagnosis not present

## 2018-10-08 ENCOUNTER — Encounter: Payer: Self-pay | Admitting: Podiatry

## 2018-10-08 NOTE — Progress Notes (Signed)
Subjective: David [name] presents today with painful, thick toenails 1-5 b/l that he cannot cut and which interfere with daily activities.  Pain is aggravated when wearing enclosed shoe gear.  David [name] relates trauma to his right 4th digit which occurred 2 days ago. He relates toenail came off when pulling up his bedsheet. He remembers his toe bleeding quite a bit, but he got it to stop. He has been applying a bandaid to it daily.  [name], MD is his PCP. Last visit was 05/30/2018.  Allergies  Allergen Reactions  . Adhesive [Tape] Other (See Comments)    blisters    Objective: Vitals:   10/01/18 1619  BP: (!) 180/82   Vascular Examination: Capillary refill time <4 seconds x 10 digits.  Dorsalis pedis 1/4 b/l.   Posterior tibial pulses nonpalpable b/l.  Digital hair absent x 7 digits.  Skin temperature gradient WNL b/l.  Dermatological Examination: Skin with mild atrophy b/l.  Toenails right 5th digit and digits 1-5 left were discolored, thick, dystrophic with subungual debris and pain with palpation to nailbeds due to thickness of nails.  Evidence of total nail avulsion with red, granular nailbed. No erythema, no edema, no drainage, no flocculence.      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  Hammertoes left foot.  Neurological: Sensation absemt with 10 gram monofilament.  Assessment: 1. Painful onychomycosis right 5th digit and digits 1-5 left foot 2.  Traumatic nail avulsion right 4th digit 3.  NIDDM with neuropathy 4. S/p amputations right hallux, right 2nd digit amputation, partial right 3rd digit amputation  Plan: 1. Toenails right 5th digit and digits 1-5 left foot were debrided in length and girth without iatrogenic bleeding. 2. Nailbed right 4th digit cleansed with wound cleanser. Triple antibiotic ointment and bandaid  applied. Prescription written for Mupirocin Ointment. Patient is to apply to right 4th digit nailbed three times per day. 3. Patient to continue soft, supportive shoe gear daily. 4. Patient to report any pedal injuries to medical professional immediately. 5. Follow up 3 months.  6. Patient/POA to call should there be a concern in the interim.

## 2018-10-10 ENCOUNTER — Encounter: Payer: Medicare Other | Attending: Endocrinology | Admitting: Nutrition

## 2018-10-10 ENCOUNTER — Other Ambulatory Visit: Payer: Self-pay | Admitting: Endocrinology

## 2018-10-10 ENCOUNTER — Other Ambulatory Visit: Payer: Self-pay

## 2018-10-10 DIAGNOSIS — Z794 Long term (current) use of insulin: Secondary | ICD-10-CM | POA: Insufficient documentation

## 2018-10-10 DIAGNOSIS — IMO0001 Reserved for inherently not codable concepts without codable children: Secondary | ICD-10-CM

## 2018-10-10 DIAGNOSIS — E119 Type 2 diabetes mellitus without complications: Secondary | ICD-10-CM | POA: Insufficient documentation

## 2018-10-10 DIAGNOSIS — E1165 Type 2 diabetes mellitus with hyperglycemia: Secondary | ICD-10-CM

## 2018-10-11 NOTE — Patient Instructions (Signed)
Read over handout and manual on how to use the Dexcom Call help line if questions.

## 2018-10-11 NOTE — Progress Notes (Signed)
Wife, son and patient were trained on how to use the Dexcom.  A sensor was inserted into his left arm, and his receiver was set up with date/time and linked to the transmitter.  He was shown how to remove the transmitter, and reuse this again in 10 days.  They reported good understanding of this. They were also given a sheet of paper with directions on how to link his account with our,and a e-mail with the link to do this was sent to the patient.  They had no final questions.

## 2018-10-15 ENCOUNTER — Ambulatory Visit: Payer: Self-pay | Admitting: Family Medicine

## 2018-10-15 NOTE — Telephone Encounter (Signed)
Pt hung up while agent was giving me the information for the call.

## 2018-10-16 ENCOUNTER — Ambulatory Visit: Payer: Medicare Other | Admitting: Podiatry

## 2018-10-16 DIAGNOSIS — E113211 Type 2 diabetes mellitus with mild nonproliferative diabetic retinopathy with macular edema, right eye: Secondary | ICD-10-CM | POA: Diagnosis not present

## 2018-10-16 DIAGNOSIS — H02831 Dermatochalasis of right upper eyelid: Secondary | ICD-10-CM | POA: Diagnosis not present

## 2018-10-16 DIAGNOSIS — H18413 Arcus senilis, bilateral: Secondary | ICD-10-CM | POA: Diagnosis not present

## 2018-10-16 DIAGNOSIS — E113292 Type 2 diabetes mellitus with mild nonproliferative diabetic retinopathy without macular edema, left eye: Secondary | ICD-10-CM | POA: Diagnosis not present

## 2018-10-16 DIAGNOSIS — H26491 Other secondary cataract, right eye: Secondary | ICD-10-CM | POA: Diagnosis not present

## 2018-10-16 DIAGNOSIS — H02834 Dermatochalasis of left upper eyelid: Secondary | ICD-10-CM | POA: Diagnosis not present

## 2018-10-17 ENCOUNTER — Telehealth: Payer: Self-pay | Admitting: *Deleted

## 2018-10-17 ENCOUNTER — Telehealth: Payer: Self-pay | Admitting: Endocrinology

## 2018-10-17 ENCOUNTER — Other Ambulatory Visit: Payer: Self-pay | Admitting: Endocrinology

## 2018-10-17 NOTE — Telephone Encounter (Signed)
I called pt's home phone and spoke with pt's wife, [name]. [name] states pt had an eye procedure yesterday that ran long so they had to cancel Dr. Heber [place] appt 10/16/2018, but the toe is doing well. I told [name] to tell pt we wanted to make sure he was okay, and to call when ready to reschedule.

## 2018-10-17 NOTE — Telephone Encounter (Signed)
David [name] stated that they are in need of office visit notes from 01/01/2018. Informed them that this would be sent.

## 2018-10-17 NOTE — Telephone Encounter (Signed)
AJ with [facility] care ph# [phone] called requesting the clinical notes that [facility] care faxed to our office be completed and sent back to them at fax# [phone]

## 2018-10-22 ENCOUNTER — Other Ambulatory Visit: Payer: Self-pay

## 2018-10-22 ENCOUNTER — Telehealth: Payer: Self-pay | Admitting: Family Medicine

## 2018-10-22 MED ORDER — INSULIN REGULAR HUMAN (CONC) 500 UNIT/ML ~~LOC~~ SOLN: SUBCUTANEOUS | 2 refills | Status: DC

## 2018-10-22 NOTE — Telephone Encounter (Signed)
Copied from [place] [id number]. Topic: General - Other >> Oct 19, 2018  4:11 PM Celene Kras A wrote: Reason for CRM: F[facility] Kinder[facility] from [facility], called stating they sent a fax over regarding cpap supplies on 10/17/2018. Please advise.  Callback # [phone]

## 2018-10-23 ENCOUNTER — Other Ambulatory Visit (INDEPENDENT_AMBULATORY_CARE_PROVIDER_SITE_OTHER): Payer: Medicare Other

## 2018-10-23 ENCOUNTER — Other Ambulatory Visit: Payer: Self-pay

## 2018-10-23 DIAGNOSIS — E1165 Type 2 diabetes mellitus with hyperglycemia: Secondary | ICD-10-CM

## 2018-10-23 DIAGNOSIS — Z961 Presence of intraocular lens: Secondary | ICD-10-CM | POA: Diagnosis not present

## 2018-10-23 DIAGNOSIS — Z794 Long term (current) use of insulin: Secondary | ICD-10-CM

## 2018-10-23 LAB — BASIC METABOLIC PANEL
BUN: 43 mg/dL — ABNORMAL HIGH (ref 6–23)
CO2: 24 mEq/L (ref 19–32)
Calcium: 9.4 mg/dL (ref 8.4–10.5)
Chloride: 107 mEq/L (ref 96–112)
Creatinine, Ser: 1.96 mg/dL — ABNORMAL HIGH (ref 0.40–1.50)
GFR: 33.23 mL/min — ABNORMAL LOW (ref >60.00)
Glucose, Bld: 235 mg/dL — ABNORMAL HIGH (ref 70–99)
Potassium: 5.1 mEq/L (ref 3.5–5.1)
Sodium: 138 mEq/L (ref 135–145)

## 2018-10-23 LAB — HEMOGLOBIN A1C: Hgb A1c MFr Bld: 9 % — ABNORMAL HIGH (ref 4.6–6.5)

## 2018-10-23 LAB — HM DIABETES EYE EXAM

## 2018-10-23 NOTE — Telephone Encounter (Signed)
Left voice message to fax form,did not receive.

## 2018-10-26 ENCOUNTER — Encounter: Payer: Medicare Other | Admitting: Endocrinology

## 2018-10-26 NOTE — Progress Notes (Signed)
This encounter was created in error - please disregard.

## 2018-10-31 ENCOUNTER — Other Ambulatory Visit: Payer: Self-pay

## 2018-10-31 ENCOUNTER — Ambulatory Visit (INDEPENDENT_AMBULATORY_CARE_PROVIDER_SITE_OTHER): Payer: Medicare Other | Admitting: Endocrinology

## 2018-10-31 ENCOUNTER — Encounter: Payer: Self-pay | Admitting: Endocrinology

## 2018-10-31 VITALS — BP 130/68 | HR 80 | Ht 76.0 in | Wt 294.0 lb

## 2018-10-31 DIAGNOSIS — Z794 Long term (current) use of insulin: Secondary | ICD-10-CM

## 2018-10-31 DIAGNOSIS — I639 Cerebral infarction, unspecified: Secondary | ICD-10-CM

## 2018-10-31 DIAGNOSIS — E1165 Type 2 diabetes mellitus with hyperglycemia: Secondary | ICD-10-CM | POA: Diagnosis not present

## 2018-10-31 DIAGNOSIS — I10 Essential (primary) hypertension: Secondary | ICD-10-CM | POA: Diagnosis not present

## 2018-10-31 MED ORDER — VICTOZA 18 MG/3ML ~~LOC~~ SOPN: 1.8000 mg | PEN_INJECTOR | Freq: Every day | SUBCUTANEOUS | 1 refills | Status: DC

## 2018-10-31 NOTE — Patient Instructions (Addendum)
To try and bolus 30 minutes before eating as much as possible  Cover all carbs  Exercise  Victoza 1.8mg  daily

## 2018-10-31 NOTE — Progress Notes (Signed)
Patient ID: David [name], male   DOB: 03-06-1941, 78 y.o.   MRN: [id number]    Reason for Appointment: Followup for Type 2 Diabetes  History of Present Illness:          Diagnosis: Type 2 diabetes mellitus, date of diagnosis:   1992       Past history:  He was initially treated with metformin and at some point also glipizide. Apparently he was started on insulin in 1994 approximately because of poor control He has been on various insulin regimens over the last several years However even with insulin he has had poor control for at least the last 7 or 8 years. He does not know what his previous A1c levels have been. He had been continued on metformin and glipizide but metformin stopped because of kidney function abnormality He had been taking Lantus 60 units twice a day with NovoLog previously and also Before his initial consultation in 6/15 he was on NovoLog twice a day and Humalog mix insulin  Because of poor control and large insulin doses he was started on Victoza in 6/15  Since 04/28/14 he had been on a Medtronic insulin pump because of persistent poor control and high insulin requirement  Recent history:   Medtronic 723 PUMP  regimen with U-500 insulin as follows  BASAL rates:Midnight = 0.55, 6 AM = 0.75, 1 PM = 0.85, 9 PM = 0.60    I/C ratio: 12, ISF: 60, target 150-150.  Active insulin 6 hours   Has had A1c's of mostly over 8%, now 9   Current management, problems and blood sugar patterns:  His blood sugars have been higher than before  Even with using the Dexcom sensor is not able to manage his diabetes as well  He forgets to bolus at a time and usually taking the bolus and checking sugar right before each meal  His wake up times are very variable  Despite increasing basal rate on the last visit his sugars are not as good  He is not exercising much because of higher readings  Blood sugars are variable overnight including low normal readings within  the last week only on 1 night  Non-insulin hypoglycemic drugs the patient is taking are: VICTOZA 1.2 mg daily         CONTINUOUS GLUCOSE MONITORING RECORD INTERPRETATION    Dates of Recording: 6/4-- 6/20  Sensor description: Dexcom G6  Results statistics:   CGM use % of time  96.4  Average and SD  193+/-45  Time in range     39   %, was 62  % Time Above 180  61  % Time above 250  11   % Time Below target 0    Glycemic patterns summary: Blood sugars are high most of the time but highest around 2-4 PM and variably high at night You may have the lowest readings early morning or sometimes before 8 PM  Hyperglycemic episodes are occurring most frequently overnight events along with mostly high readings midday and early afternoon and inconsistently in the evenings after 7 PM  Hypoglycemic episodes are not documented  Overnight periods: Blood sugars are majority of the time significantly high with average starting at 200 at midnight until 3-4 AM and subsequently decreasing to about 125 at about 6-7 AM  Preprandial periods: Since the mealtimes are variable difficult to pinpoint these Generally blood sugars are averaging about 150 midmorning and mostly about 180 in the early afternoon Blood  sugar at dinnertime around 8 PM are averaging about 180  Postprandial periods:   After breakfast: Since this is not a consistent meal blood sugars are not analyzed but blood sugars tend to rise gradually between 9 AM and early afternoon Some days he does have a relatively higher spike after breakfast  After lunch:    Blood sugars are relatively flat but they are generally high before starting his bolus in the afternoon After dinner: Blood sugars are relatively constant after dinner with high reading before the meal usually   Self-care:   Meals: 2- 3 meals per day.   breakfast is cheerios or egg and toast.  Will have half sandwich with soup at 1 pm , dinner at  6-7 pm.     Bedtime snack is  usually crackers  with milk or have been admitted sandwich         Exercise:  Walking occasionally and exercising including on elliptical for up to 60 minutes, twice a week at the gym  Last  consultation : Most recent: 08/2017      Wt Readings from Last 3 Encounters:  10/31/18 294 lb (133.4 kg)  07/20/18 288 lb 9.6 oz (130.9 kg)  05/30/18 280 lb 12 oz (127.3 kg)   Glycemic control:   Lab Results  Component Value Date   HGBA1C 9.0 (H) 10/23/2018   HGBA1C 7.9 (H) 07/17/2018   HGBA1C 8.1 (H) 04/02/2018   Lab Results  Component Value Date   MICROALBUR 11.9 (H) 10/24/2017   LDLCALC 62 07/17/2018   CREATININE 1.96 (H) 10/23/2018            Lab Results  Component Value Date   FRUCTOSAMINE 316 (H) 07/17/2018   FRUCTOSAMINE 319 (H) 12/27/2017   FRUCTOSAMINE 280 10/24/2017   FRUCTOSAMINE 315 (H) 07/21/2017     Other active problems: See review of systems   Allergies as of 10/31/2018      Reactions   Adhesive [tape] Other (See Comments)   blisters      Medication List       Accurate as of October 31, 2018 12:56 PM. If you have any questions, ask your nurse or doctor.        STOP taking these medications   FreeStyle Libre 14 Day Reader [name] by: Elayne Snare, MD   FreeStyle Libre 14 Day Sensor Misc Stopped by: Elayne Snare, MD     TAKE these medications   acetaminophen 325 MG tablet Commonly known as: TYLENOL Take 1-2 tablets (325-650 mg total) by mouth every 4 (four) hours as needed for mild pain.   acetic acid 2 % otic solution INSTILL 4 DROPS INTO RIGHT EAR 4 TIMES DAILY FOR 7 DAYS   atorvastatin 10 MG tablet Commonly known as: LIPITOR TAKE 1 TABLET BY MOUTH ONCE DAILY AT  6  PM   calcitRIOL 0.25 MCG capsule Commonly known as: ROCALTROL Take 1 capsule by mouth once daily   carvedilol 25 MG tablet Commonly known as: COREG TAKE 1 TABLET BY MOUTH TWICE DAILY WITH A MEAL   cholecalciferol 1000 units tablet Commonly known as: VITAMIN D Take 1 tablet  (1,000 Units total) by mouth daily.   clopidogrel 75 MG tablet Commonly known as: PLAVIX Take 1 tablet by mouth once daily   diclofenac sodium 1 % Gel Commonly known as: VOLTAREN Apply 2 g topically 3 (three) times daily.   hydrALAZINE 100 MG tablet Commonly known as: APRESOLINE Take 100 mg by mouth 3 (three) times daily.  Take 1 tablet by mouth three times daily. What changed: Another medication with the same name was removed. Continue taking this medication, and follow the directions you see here. Changed by: Elayne Snare, MD   insulin regular human CONCENTRATED 500 UNIT/ML injection Commonly known as: HUMULIN R USE 0.25 ML DAILY OR AS DIRECTED IN INSULIN PUMP. DX:E11.65   isosorbide mononitrate 30 MG 24 hr tablet Commonly known as: IMDUR TAKE TWO TABLETS BY MOUTH ONCE DAILY   pantoprazole 40 MG tablet Commonly known as: PROTONIX TAKE ONE TABLET BY MOUTH ONCE DAILY   sertraline 100 MG tablet Commonly known as: ZOLOFT Take 100 mg by mouth daily.   torsemide 10 MG tablet Commonly known as: DEMADEX Take 1 tablet by mouth twice daily   Victoza 18 MG/3ML Sopn Generic drug: liraglutide Inject 0.3 mLs (1.8 mg total) into the skin daily before supper. What changed: See the new instructions. Changed by: Elayne Snare, MD   vitamin B-12 100 MCG tablet Commonly known as: CYANOCOBALAMIN Take 100 mcg by mouth daily.       Allergies:  Allergies  Allergen Reactions  . Adhesive [Tape] Other (See Comments)    blisters    Past Medical History:  Diagnosis Date  . Acute cholecystitis s/p lap cholecystectomy 03/05/2017 03/04/2017  . Anemia, iron deficiency   . Anxiety   . Arthritis   . Atrial fibrillation with rapid ventricular response ([place])   . BPH (benign prostatic hypertrophy)   . CAD (coronary artery disease)    Nonobstructive CAD per cath  . Cardiac pacemaker in situ   . CHF (congestive heart failure) ([place])   . Chronic ulcer of right foot ([place])   . CKD (chronic  kidney disease) stage 4, GFR 15-29 ml/min (HCC) 09/26/2017  . CKD (chronic kidney disease), stage III ([place]) secondary to DM and HTN   nephrologist-  [name]  . Coronary artery disease involving native coronary artery of native heart without angina pectoris   . Diastolic dysfunction   . Dyspnea   . 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 History of cellulitis    right great toe 10-25-2014  . History of skin cancer   . HOCM (hypertrophic obstructive cardiomyopathy) ([place])   . Hyperlipidemia associated with type 2 diabetes mellitus ([place]) 10/25/2013  . Hypertension   . Hypertension associated with diabetes ([place]) 10/25/2013  . 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 Insulin dependent type 2 diabetes mellitus ([place]) 1991   followd by [name]--  has insulin pump  . Insulin pump in place   . OSA on CPAP   . 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 Peripheral neuropathy    severe  . Peripheral vascular disease ([place])    bilateral lower extremities  . PVD (peripheral vascular disease) ([place])   . Rib fracture 07/24/2015  . Seasonal and perennial allergic rhinitis 10/25/2013  . Seborrheic keratosis 09/19/2016  . Secondary hyperparathyroidism of renal origin ([place])   . 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 Sinus node dysfunction (H[facility])   . Sleep apnea   . Syncope 07/24/2015    Past Surgical History:  Procedure Laterality Date  . AMPUTATION OF REPLICATED TOES  Mar 4315   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)  . AMPUTATION TOE Right 03/12/2015   Procedure: RIGHT HALLUS AMPUTATION ;  Surgeon: [name], MD;  Location: [facility];  Service: Podiatry;  Laterality: Right;  . CARDIAC CATHETERIZATION  11-25-2010   [place], [place]  Nonobstructive CAD  . CARDIAC PACEMAKER PLACEMENT  Nov 2009   Medtronic  . CHOLECYSTECTOMY N/A 03/05/2017   Procedure: LAPAROSCOPIC CHOLECYSTECTOMY WITH INTRAOPERATIVE CHOLANGIOGRAM;  Surgeon: [name], MD;  Location: [facility];  Service: General;  Laterality: N/A;  . EP IMPLANTABLE DEVICE N/A 06/03/2015   Procedure: PPM Generator Changeout;  Surgeon: [name], MD;  Location: [facility];  Service: Cardiovascular;  Laterality: N/A;  . EXCISION BONE CYST Right 03/06/2015   Procedure: BONE BIOPSIES OF RIGHT FOOT;  Surgeon: [name], MD;  Location: [facility];  Service: Podiatry;  Laterality: Right;  . ORIF ANKLE FRACTURE Left 11/06/2014   Procedure: OPEN REDUCTION INTERNAL FIXATION (ORIF) LEFT  ANKLE FRACTURE;  Surgeon: [name], MD;  Location: [facility];  Service: Orthopedics;  Laterality: Left;  . TEE WITHOUT CARDIOVERSION N/A 09/01/2017   Procedure: TRANSESOPHAGEAL ECHOCARDIOGRAM (TEE);  Surgeon: [name], MD;  Location: [facility] [place] [facility];  Service: Cardiovascular;  Laterality: N/A;  . TOTAL KNEE ARTHROPLASTY    . VEIN LIGATION AND STRIPPING      Family History  Problem Relation Age of Onset  . Cancer Mother        breast  . Heart attack Father   . Stroke Father     Social History:  reports that he quit smoking about 34 years ago. He has a 60.00 pack-year smoking history. He has never used smokeless tobacco. He reports current alcohol use of about 1.0 standard drinks of alcohol per week. He reports that he does not use drugs.    Review of Systems       Lipids: Has been on and off Lipitor since late 2014. Also had taken Gembrozil for several years without apparent side effects.  LDL is below 70 Previously had high ALT with Lipitor but not currently  HDL is consistently low  He is taking  fish oil OTC and triglycerides are below 200   Lab Results  Component Value Date   CHOL 122 07/17/2018   CHOL 123 12/27/2017   CHOL 165 08/31/2017   Lab Results  Component Value Date   HDL 25.80 (L) 07/17/2018   HDL 23.60 (L) 12/27/2017   HDL 20 (L) 08/31/2017   Lab Results  Component Value Date   LDLCALC 62 07/17/2018   LDLCALC 106  (H) 08/31/2017   LDLCALC 94 02/17/2017   Lab Results  Component Value Date   TRIG 168.0 (H) 07/17/2018   TRIG 281.0 (H) 12/27/2017   TRIG 195 (H) 08/31/2017   Lab Results  Component Value Date   CHOLHDL 5 07/17/2018   CHOLHDL 5 12/27/2017   CHOLHDL 8.3 08/31/2017   Lab Results  Component Value Date   LDLDIRECT 50.0 12/27/2017   LDLDIRECT 76.0 07/21/2017   LDLDIRECT 89.0 05/23/2017    Lab Results  Component Value Date   ALT 28 07/17/2018       HYPERTENSION: The blood pressure has been treated with various drugs including carvedilol 25 mg by his other physicians.  Blood pressure is appearing better controlled overall  BP Readings from Last 3 Encounters:  10/31/18 130/68  10/01/18 (!) 180/82  07/20/18 130/62     Chronic kidney disease: Etiology unknown, is followed by nephrologist  Creatinine ranges:  Lab Results  Component Value Date   CREATININE 1.96 (H) 10/23/2018   CREATININE 2.07 (H) 07/17/2018   CREATININE 2.2 (A) 05/24/2018         Has history of Numbness in his feet  since about 2013  He has been wearing diabetic shoes.  He has been followed by podiatrist He is  under the care of a podiatrist   Vitamin B12 deficiency: Well managed with monthly injections  Lab Results  Component Value Date   [id number] 07/17/2018       Physical Examination:  BP 130/68 (BP Location: Left Arm, Patient Position: Sitting, Cuff Size: Normal)   Pulse 80   Ht 6\' 4"  (1.93 m)   Wt 294 lb (133.4 kg)   SpO2 97%   BMI 35.79 kg/m     ASSESSMENT:  Diabetes type 2, uncontrolled with obesity See history of present illness for detailed discussion of current diabetes management, blood sugar patterns and problems identified   He is on the U-500 insulin with the Medtronic pump and now using the Dexcom sensor  A1c has gone up to 9  Recently his average blood sugar is indicating his A1c to be closer to 8.5 However he mostly has high readings throughout the day  with sugars going up with meals especially in the afternoon  This may be partly related to not bolusing before eating His day-to-day management was discussed in detail   Hypertension: Well controlled  Renal dysfunction: Stable  PLAN:   Basal rate settings as follows: Midnight = 0.60, 6 AM unchanged at 0.75, 11 AM = 0.95, 9 PM = 0.65 and sensitivity 1: 50 Carb ratio and 1: 10 Target 130-150  His son will be helping him manage his diabetes a little more now He will be shown how to program the insulin pump the patient cannot do Stressed importance of bolusing 30 with before eating Also needs to try and get back on his exercise bike which was probably helping in the past visit Increase Victoza up to 1.8 mg Continue monitoring carbohydrates and control portions Discussed blood sugar targets at various times  Counseling time on subjects discussed in assessment and plan sections is over 50% of today's 25 minute visit    Patient Instructions  To try and bolus 30 minutes before eating as much as possible  Cover all carbs  Exercise  Victoza 1.8mg  daily     Elayne Snare 10/31/2018, 12:56 PM   Note: This office note was prepared with Dragon voice recognition system technology. Any transcriptional errors that result from this process are unintentional.

## 2018-11-07 DIAGNOSIS — H35373 Puckering of macula, bilateral: Secondary | ICD-10-CM | POA: Diagnosis not present

## 2018-11-07 DIAGNOSIS — H43813 Vitreous degeneration, bilateral: Secondary | ICD-10-CM | POA: Diagnosis not present

## 2018-11-07 DIAGNOSIS — E113313 Type 2 diabetes mellitus with moderate nonproliferative diabetic retinopathy with macular edema, bilateral: Secondary | ICD-10-CM | POA: Diagnosis not present

## 2018-11-11 ENCOUNTER — Other Ambulatory Visit: Payer: Self-pay

## 2018-11-11 ENCOUNTER — Ambulatory Visit (HOSPITAL_COMMUNITY)
Admission: EM | Admit: 2018-11-11 | Discharge: 2018-11-11 | Disposition: A | Discharge status: Home / Self Care | Payer: Medicare Other | Attending: Emergency Medicine | Admitting: Emergency Medicine

## 2018-11-11 DIAGNOSIS — M25562 Pain in left knee: Secondary | ICD-10-CM | POA: Diagnosis not present

## 2018-11-11 DIAGNOSIS — M7022 Olecranon bursitis, left elbow: Secondary | ICD-10-CM | POA: Diagnosis not present

## 2018-11-11 MED ORDER — DICLOFENAC SODIUM 1 % TD GEL: 2.0000 g | Freq: Four times a day (QID) | TRANSDERMAL | 0 refills | Status: DC

## 2018-11-11 MED ORDER — PREDNISONE 10 MG PO TABS: 30.0000 mg | ORAL_TABLET | Freq: Every day | ORAL | 0 refills | Status: AC

## 2018-11-11 NOTE — ED Triage Notes (Signed)
Per pt he fell on Thursday and now is having left knee pain and a big knot has come up on his left elbow also. Did not hit head nor LOC

## 2018-11-11 NOTE — Discharge Instructions (Signed)
Elbow pain and swelling is inflammation/bursitis.   Begin prednisone daily 30 mg for 5 days with food, please keep a close monitor of her blood sugars with this medicine Please tried to maintain a healthy diet over the next few weeks May apply Voltaren gel topically to left knee to help with pain and swelling Ice and elevate knee   Please follow-up if developing increased pain swelling or redness

## 2018-11-12 ENCOUNTER — Encounter: Payer: Medicare Other | Admitting: *Deleted

## 2018-11-12 NOTE — ED Provider Notes (Signed)
[facility]    CSN: [id number] Arrival date & time: 11/11/18  1100      History   Chief Complaint Chief Complaint  Patient presents with  . Knee Pain    left elbow    HPI David [name] is a 78 y.o. male history of CKD, CHF, CAD, A. fib, presenting today for evaluation of left elbow and left knee pain.  Patient states that on Thursday he landed against the mattress of the bed with his elbow catching something.  Since then he has developed swelling to the tip of his elbow.  He denies significant pain associated with this.  Denies difficulty bending elbow.  He also notes that he is developed left knee pain over the past 4 to 5 days as well.  He denies any injury or fall related to his knee.  Feels similar to chronic knee pain he has off and on.  He has taken some Tylenol without relief.  Has stiffness with moving.  Denies redness.  Denies previously seeing orthopedics or having previous injections.   HPI  Past Medical History:  Diagnosis Date  . Acute cholecystitis s/p lap cholecystectomy 03/05/2017 03/04/2017  . Anemia, iron deficiency   . Anxiety   . Arthritis   . Atrial fibrillation with rapid ventricular response ([place])   . BPH (benign prostatic hypertrophy)   . CAD (coronary artery disease)    Nonobstructive CAD per cath  . Cardiac pacemaker in situ   . CHF (congestive heart failure) ([place])   . Chronic ulcer of right foot ([place])   . CKD (chronic kidney disease) stage 4, GFR 15-29 ml/min (HCC) 09/26/2017  . CKD (chronic kidney disease), stage III ([place]) secondary to DM and HTN   nephrologist-  [name]  . Coronary artery disease involving native coronary artery of native heart without angina pectoris   . Diastolic dysfunction   . Dyspnea   . 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 History of cellulitis    right great toe 10-25-2014  . History of skin cancer   . HOCM (hypertrophic obstructive cardiomyopathy) ([place])    . Hyperlipidemia associated with type 2 diabetes mellitus ([place]) 10/25/2013  . Hypertension   . Hypertension associated with diabetes ([place]) 10/25/2013  . 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 Insulin pump in place   . OSA on CPAP   . 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 Peripheral neuropathy    severe  . Peripheral vascular disease ([place])    bilateral lower extremities  . PVD (peripheral vascular disease) ([place])   . Rib fracture 07/24/2015  . Seasonal and perennial allergic rhinitis 10/25/2013  . Seborrheic keratosis 09/19/2016  . Secondary hyperparathyroidism of renal origin ([place])   . 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 Sinus node dysfunction (HCC)   . Sleep apnea   . Syncope 07/24/2015    Patient Active Problem List   Diagnosis Date Noted  . Senile purpura ([place]) 11/27/2017  . CKD (chronic kidney disease) stage 4, GFR 15-29 ml/min (HCC) 09/26/2017  . 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 Nonobstructive CAD s/p PCI 2012   . Cardiac pacemaker in situ   . PVD (peripheral vascular disease) ([place])   . Anxiety 03/22/2017  . Acute cholecystitis s/p lap cholecystectomy 03/05/2017 03/04/2017  .  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[place]) 03/04/2017  . 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 Fatigue 01/27/2017  . Low vitamin B12 level 01/27/2017  . OSA on CPAP 10/26/2014  . Seasonal and perennial allergic rhinitis 10/25/2013  . Hypertension associated with diabetes ([place]) 10/25/2013  . Hyperlipidemia associated with type 2 diabetes mellitus ([place]) 10/25/2013    Past Surgical History:  Procedure Laterality Date  . AMPUTATION OF REPLICATED TOES  Mar 8676   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)  . AMPUTATION TOE Right 03/12/2015   Procedure: RIGHT HALLUS AMPUTATION ;  Surgeon: [name], MD;   Location: [facility];  Service: Podiatry;  Laterality: Right;  . CARDIAC CATHETERIZATION  11-25-2010   [place], [place]   Nonobstructive CAD  . CARDIAC PACEMAKER PLACEMENT  Nov 2009   [employer]  . CHOLECYSTECTOMY N/A 03/05/2017   Procedure: LAPAROSCOPIC CHOLECYSTECTOMY WITH INTRAOPERATIVE CHOLANGIOGRAM;  Surgeon: [name], MD;  Location: [facility];  Service: General;  Laterality: N/A;  . EP IMPLANTABLE DEVICE N/A 06/03/2015   Procedure: PPM Generator Changeout;  Surgeon: [name], MD;  Location: [facility];  Service: Cardiovascular;  Laterality: N/A;  . EXCISION BONE CYST Right 03/06/2015   Procedure: BONE BIOPSIES OF RIGHT FOOT;  Surgeon: [name], MD;  Location: [facility];  Service: Podiatry;  Laterality: Right;  . ORIF ANKLE FRACTURE Left 11/06/2014   Procedure: OPEN REDUCTION INTERNAL FIXATION (ORIF) LEFT  ANKLE FRACTURE;  Surgeon: [name], MD;  Location: [facility];  Service: Orthopedics;  Laterality: Left;  . TEE WITHOUT CARDIOVERSION N/A 09/01/2017   Procedure: TRANSESOPHAGEAL ECHOCARDIOGRAM (TEE);  Surgeon: [name], MD;  Location: [facility];  Service: Cardiovascular;  Laterality: N/A;  . TOTAL KNEE ARTHROPLASTY    . VEIN LIGATION AND STRIPPING         Home Medications    Prior to Admission medications   Medication Sig Start Date End Date Taking? Authorizing Provider  acetaminophen (TYLENOL) 325 MG tablet Take 1-2 tablets (325-650 mg total) by mouth every 4 (four) hours as needed for mild pain. 09/12/17   Love, Ivan Anchors, PA-C  acetic acid 2 % otic solution INSTILL 4 DROPS INTO RIGHT EAR 4 TIMES DAILY FOR 7 DAYS 12/04/17   [provider]  atorvastatin (LIPITOR) 10 MG tablet TAKE 1 TABLET BY MOUTH ONCE DAILY AT  6  PM 09/18/18   [name], MD  calcitRIOL (ROCALTROL) 0.25 MCG capsule Take 1 capsule by mouth once daily 09/13/18   [name], MD  carvedilol (COREG) 25 MG tablet TAKE 1 TABLET  BY MOUTH TWICE DAILY WITH A MEAL 09/13/18   [name], MD  cholecalciferol (VITAMIN D) 1000 units tablet Take 1 tablet (1,000 Units total) by mouth daily. 02/01/17   [name], MD  clopidogrel (PLAVIX) 75 MG tablet Take 1 tablet by mouth once daily 09/13/18   [name], MD  diclofenac sodium (VOLTAREN) 1 % GEL Apply 2 g topically 4 (four) times daily. 11/11/18   Madalyne Husk C, PA-C  hydrALAZINE (APRESOLINE) 100 MG tablet Take 100 mg by mouth 3 (three) times daily. Take 1 tablet by mouth three times daily.    [provider]  insulin regular human CONCENTRATED (HUMULIN R) 500 UNIT/ML injection USE 0.25 ML DAILY OR AS DIRECTED IN INSULIN PUMP. DX:E11.65 10/22/18   Elayne Snare, MD  isosorbide mononitrate (IMDUR) 30 MG 24 hr tablet TAKE TWO TABLETS BY MOUTH ONCE DAILY  09/17/18   [name], MD  liraglutide (VICTOZA) 18 MG/3ML SOPN Inject 0.3 mLs (1.8 mg total) into the skin daily before supper. 10/31/18   Elayne Snare, MD  pantoprazole (PROTONIX) 40 MG tablet TAKE ONE TABLET BY MOUTH ONCE DAILY 02/07/17   [name], FNP  predniSONE (DELTASONE) 10 MG tablet Take 3 tablets (30 mg total) by mouth daily with breakfast for 5 days. 11/11/18 11/16/18  Sharvil Hoey C, PA-C  sertraline (ZOLOFT) 100 MG tablet Take 100 mg by mouth daily.    [provider]  torsemide (DEMADEX) 10 MG tablet Take 1 tablet by mouth twice daily 09/13/18   [name], MD  vitamin B-12 (CYANOCOBALAMIN) 100 MCG tablet Take 100 mcg by mouth daily.    [provider]    Family History Family History  Problem Relation Age of Onset  . Cancer Mother        breast  . Heart attack Father   . Stroke Father     Social History Social History   Tobacco Use  . Smoking status: Former Smoker    Packs/day: 2.00    Years: 30.00    Pack years: 60.00    Quit date: 03/03/1984    Years since quitting: 34.7  . Smokeless tobacco: Never Used  Substance Use Topics  . Alcohol use: Yes     Alcohol/week: 1.0 standard drinks    Types: 1 Glasses of wine per week    Comment: social  . Drug use: No     Allergies   Adhesive [tape]   Review of Systems Review of Systems  Constitutional: Negative for fatigue and fever.  Eyes: Negative for redness, itching and visual disturbance.  Respiratory: Negative for shortness of breath.   Cardiovascular: Negative for chest pain and leg swelling.  Gastrointestinal: Negative for nausea and vomiting.  Musculoskeletal: Positive for arthralgias and joint swelling. Negative for myalgias.  Skin: Positive for color change. Negative for rash and wound.  Neurological: Negative for dizziness, syncope, weakness, light-headedness and headaches.     Physical Exam Triage Vital Signs ED Triage Vitals  Enc Vitals Group     BP 11/11/18 1228 (!) 148/60     Pulse Rate 11/11/18 1228 60     Resp 11/11/18 1228 18     Temp 11/11/18 1228 (!) 97.3 F (36.3 C)     Temp Source 11/11/18 1228 Oral     SpO2 11/11/18 1228 98 %     Weight --      Height --      Head Circumference --      Peak Flow --      Pain Score 11/11/18 1230 8     Pain Loc --      Pain Edu? --      Excl. in [place]? --    No data found.  Updated Vital Signs BP (!) 148/60 (BP Location: Right Arm)   Pulse 60   Temp (!) 97.3 F (36.3 C) (Oral)   Resp 18   SpO2 98%   Visual Acuity Right Eye Distance:   Left Eye Distance:   Bilateral Distance:    Right Eye Near:   Left Eye Near:    Bilateral Near:     Physical Exam Vitals signs and nursing note reviewed.  Constitutional:      Appearance: He is well-developed.     Comments: No acute distress  HENT:     Head: Normocephalic and atraumatic.     Nose:  Nose normal.  Eyes:     Conjunctiva/sclera: Conjunctivae normal.  Neck:     Musculoskeletal: Neck supple.  Cardiovascular:     Rate and Rhythm: Normal rate.  Pulmonary:     Effort: Pulmonary effort is normal. No respiratory distress.  Abdominal:     General: There is no  distension.  Musculoskeletal: Normal range of motion.     Comments: Left elbow: Swelling and mild erythema localized olecranon bursa, no significant warmth to touch, full active range of motion of elbow, nontender to palpation  Left knee: No overlying erythema, mild swelling compared to right, relatively full active range of motion although slowed.  Nontender to palpation over patella and medial lateral joint line.  Skin:    General: Skin is warm and dry.  Neurological:     Mental Status: He is alert and oriented to person, place, and time.      UC Treatments / Results  Labs (all labs ordered are listed, but only abnormal results are displayed) Labs Reviewed - No data to display  EKG None  Radiology No results found.  Procedures Procedures (including critical care time)  Medications Ordered in UC Medications - No data to display  Initial Impression / Assessment and Plan / UC Course  I have reviewed the triage vital signs and the nursing notes.  Pertinent labs & imaging results that were available during my care of the patient were reviewed by me and considered in my medical decision making (see chart for details).     Left elbow appears to have a olecranon bursitis, swelling localized, no significant warmth or erythema suggestive of cellulitis, full active range of motion.  Will recommend Ace wrap and anti-inflammatories.  Left knee without mechanism of injury, do not suspect acute bony abnormality.  Most likely underlying arthritis given recurrent nature of pain as well as weight.  Will also recommend Ace and anti-inflammatories.  Patient is on Plavix, will defer further NSAIDs.  Patient is also diabetic, but feel this is the safest option for him.  He receives insulin via pump.  Will provide short course of 30 mg x 5 days.  Stressed importance of monitoring blood sugars and working on diet.  Will provide topical Voltaren gel and continue Tylenol.  Discussed strict return  precautions. Patient verbalized understanding and is agreeable with plan.  Final Clinical Impressions(s) / UC Diagnoses   Final diagnoses:  Olecranon bursitis of left elbow  Acute pain of left knee     Discharge Instructions     Elbow pain and swelling is inflammation/bursitis.   Begin prednisone daily 30 mg for 5 days with food, please keep a close monitor of her blood sugars with this medicine Please tried to maintain a healthy diet over the next few weeks May apply Voltaren gel topically to left knee to help with pain and swelling Ice and elevate knee   Please follow-up if developing increased pain swelling or redness     ED Prescriptions    Medication Sig Dispense Auth. Provider   predniSONE (DELTASONE) 10 MG tablet Take 3 tablets (30 mg total) by mouth daily with breakfast for 5 days. 15 tablet Frutoso Dimare C, PA-C   diclofenac sodium (VOLTAREN) 1 % GEL Apply 2 g topically 4 (four) times daily. 150 g Shonta Phillis, [place] C, PA-C     Controlled Substance Prescriptions [place] Controlled Substance Registry consulted? Not Applicable   Janith Lima, [place] 11/12/18 1711

## 2018-11-13 ENCOUNTER — Telehealth: Payer: Self-pay

## 2018-11-13 NOTE — Telephone Encounter (Signed)
Left message for patient to remind of missed remote transmission.  

## 2018-11-19 ENCOUNTER — Ambulatory Visit (INDEPENDENT_AMBULATORY_CARE_PROVIDER_SITE_OTHER): Payer: Medicare Other | Admitting: Family Medicine

## 2018-11-19 ENCOUNTER — Other Ambulatory Visit: Payer: Self-pay

## 2018-11-19 ENCOUNTER — Encounter: Payer: Self-pay | Admitting: Family Medicine

## 2018-11-19 VITALS — BP 138/84 | HR 70 | Temp 98.4°F | Ht 76.0 in | Wt 293.0 lb

## 2018-11-19 DIAGNOSIS — M25562 Pain in left knee: Secondary | ICD-10-CM | POA: Diagnosis not present

## 2018-11-19 DIAGNOSIS — R413 Other amnesia: Secondary | ICD-10-CM | POA: Diagnosis not present

## 2018-11-19 DIAGNOSIS — M7022 Olecranon bursitis, left elbow: Secondary | ICD-10-CM | POA: Diagnosis not present

## 2018-11-19 NOTE — Assessment & Plan Note (Addendum)
Likely multifactorial in setting of stroke and multiple comorbidities.  Discussed going back to neurology for further evaluation however they declined.  They will continue working on mental exercises and let me know if there are any significant rapid decline.

## 2018-11-19 NOTE — Patient Instructions (Signed)
It was very nice to see you today!  Please use compression and ice on your elbow and knee.  Please continue working on mental exercises daily.  Let me know if your symptoms worsen or do not improve in the next couple of weeks.   Take care, Dr [name]

## 2018-11-19 NOTE — Progress Notes (Signed)
   Chief Complaint:  David [name] is a 78 y.o. male who presents today with a chief complaint of Joint Pain.   Assessment/Plan:  Olecranon bursitis Improving status post prednisone burst.  He can continue using topical Voltaren gel as needed.  Recommended compression and ice to the area.  Discussed reasons to return to care.  Follow-up as needed.  Left knee pain Likely secondary to osteoarthritis flare.  This is also improving status post prednisone burst.  He can also continue using topical Voltaren gel as needed.  Recommended compression and ice.  Discussed reasons to return to care.  Follow-up as needed.  Memory change Likely multifactorial in setting of stroke and multiple comorbidities.  Discussed going back to neurology for further evaluation however they declined.  They will continue working on mental exercises and let me know if there are any significant rapid decline.      Subjective:  HPI:  Joint Pain  Symptoms started 1 to 2 weeks ago.  He was seen in urgent care about a week ago.  At that time he was having left knee pain and swelling to his left elbow.  He was started on prednisone burst with some improvement in symptoms.  Denies any obvious injuries or other precipitating events.  Symptoms have improved over last couple of days however still has significant swelling to his left elbow into his left knee.  He is not having very much pain anymore.  He has not tried compression or ice to the areas.  No weakness or numbness.  Memory loss Patient and wife also concerned about some difficulty with short-term recall.  Patient states this is not a concern.  Wife notices that he will frequently write things down no pedicures will self reminded when she thinks.  They have no concerns about activities of daily living from a mental standpoint.  He tries to stay mentally active and does puzzles daily.   ROS: Per HPI  PMH: He reports that he quit smoking about 34 years ago. He has a  60.00 pack-year smoking history. He has never used smokeless tobacco. He reports current alcohol use of about 1.0 standard drinks of alcohol per week. He reports that he does not use drugs.      Objective:  Physical Exam: BP 138/84 (BP Location: Left Arm, Patient Position: Sitting, Cuff Size: Normal)   Pulse 70   Temp 98.4 F (36.9 C) (Oral)   Ht 6\' 4"  (1.93 m)   Wt 293 lb (132.9 kg)   SpO2 97%   BMI 35.67 kg/m   General: No acute distress.  Resting comfortably MSK: Effusion noted to left olecranon.  Nontender to palpation. -Left knee: Mild effusion noted.  No joint line tenderness.  Stable varus and valgus stress.  Anterior and posterior drawer signs negative.     [name], MD 11/19/2018 3:37 PM

## 2018-11-21 ENCOUNTER — Telehealth: Payer: Self-pay | Admitting: Endocrinology

## 2018-11-21 NOTE — Telephone Encounter (Signed)
Please see Dexcom printout that has been placed in your to be signed folder.

## 2018-11-21 NOTE — Telephone Encounter (Signed)
Called pt's wife and she stated that the pt was connected to the [facility] data sharing tool. The last 14 days of Dexcom data was printed off to give to MD for review.

## 2018-11-21 NOTE — Telephone Encounter (Signed)
Patient's wife called to advise that patient is having consistently blood sugars in the am.  Needs assistance with pump management and/or dosing advice

## 2018-11-22 ENCOUNTER — Ambulatory Visit (INDEPENDENT_AMBULATORY_CARE_PROVIDER_SITE_OTHER): Payer: Medicare Other | Admitting: Endocrinology

## 2018-11-22 ENCOUNTER — Encounter: Payer: Self-pay | Admitting: Endocrinology

## 2018-11-22 DIAGNOSIS — I639 Cerebral infarction, unspecified: Secondary | ICD-10-CM

## 2018-11-22 DIAGNOSIS — E1165 Type 2 diabetes mellitus with hyperglycemia: Secondary | ICD-10-CM | POA: Diagnosis not present

## 2018-11-22 DIAGNOSIS — Z794 Long term (current) use of insulin: Secondary | ICD-10-CM | POA: Diagnosis not present

## 2018-11-22 NOTE — Progress Notes (Signed)
Patient ID: David [name], male   DOB: 1940/09/15, 79 y.o.   MRN: [id number]  Today's office visit was provided via telemedicine using video call The patient was explained the limitations of evaluation and management by telemedicine and the availability of in person appointments.  The patient understood the limitations and agreed to proceed. Patient also understood that the telehealth visit is billable. . Location of the patient: Patient's home . Location of the provider: Physician office Only the patient and myself were participating in the encounter    Reason for Appointment: Followup for Type 2 Diabetes and review of Dexcom sensor activity  History of Present Illness:          Diagnosis: Type 2 diabetes mellitus, date of diagnosis:   1992       Past history:  He was initially treated with metformin and at some point also glipizide. Apparently he was started on insulin in 1994 approximately because of poor control He has been on various insulin regimens over the last several years However even with insulin he has had poor control for at least the last 7 or 8 years. He does not know what his previous A1c levels have been. He had been continued on metformin and glipizide but metformin stopped because of kidney function abnormality He had been taking Lantus 60 units twice a day with NovoLog previously and also Before his initial consultation in 6/15 he was on NovoLog twice a day and Humalog mix insulin  Because of poor control and large insulin doses he was started on Victoza in 6/15  Since 04/28/14 he had been on a Medtronic insulin pump because of persistent poor control and high insulin requirement  Recent history:   Medtronic 723 PUMP  regimen with U-500 insulin as follows  Midnight = 0.60, 6 AM unchanged at 0.75, 11 AM = 0.95, 9 PM = 0.65 and sensitivity 1: 50 Carb ratio and 1: 10 Target 130-150  BASAL rates:Midnight = 0.55, 6 AM = 0.75, 1 PM = 0.85, 9 PM = 0.60     I/C ratio: 12, ISF: 60, target 150-150.  Active insulin 6 hours   Has had A1c's of mostly over 8%, now 9   Current management, problems and blood sugar patterns:  His blood sugar download through 7/7 is showing the following patterns:  His blood sugars are inconsistent and showing somewhat different patterns in the last week compared to the week before  Most of his higher blood sugars appear to be late afternoon and evening but this is inconsistent  Patient states that he was given 30 mg prednisone on 6/28 for 5 days which may have raised his blood sugars before mostly high until 7/4  Also sometimes blood sugars been stay high through the night but again not consistently and less recently  He may have low normal or slightly low readings early morning especially on 7/6 and previously also occasionally  Overnight blood sugars in the first week were fairly good but higher in the last week depending on blood sugar the night before  Also during the day her blood sugars are better in the last week compared to the week before when they were mostly high  However his blood sugar is more consistently in target range compared to the previous visit  Non-insulin hypoglycemic drugs the patient is taking are: VICTOZA 1.8mg  daily         CONTINUOUS GLUCOSE MONITORING RECORD     CGM use % of time  2-week average/SD  182+/-66  Time in range  53      % was 39  % Time Above 180  45  % Time above 250  18  % Time Below 70  1.8     PRE-MEAL Fasting Lunch Dinner Bedtime Overall  Glucose range:       Averages:        POST-MEAL PC Breakfast PC Lunch PC Dinner  Glucose range:     Averages:         Results statistics:   CGM use % of time  96.4  Average and SD  193+/-45  Time in range     39   %, was 62  % Time Above 180  61  % Time above 250  11   % Time Below target 0    Glycemic patterns summary: Blood sugars are high most of the time but highest around 2-4 PM and variably  high at night You may have the lowest readings early morning or sometimes before 8 PM  Hyperglycemic episodes are occurring most frequently overnight events along with mostly high readings midday and early afternoon and inconsistently in the evenings after 7 PM  Hypoglycemic episodes are not documented  Overnight periods: Blood sugars are majority of the time significantly high with average starting at 200 at midnight until 3-4 AM and subsequently decreasing to about 125 at about 6-7 AM  Preprandial periods: Since the mealtimes are variable difficult to pinpoint these Generally blood sugars are averaging about 150 midmorning and mostly about 180 in the early afternoon Blood sugar at dinnertime around 8 PM are averaging about 180  Postprandial periods:   After breakfast: Since this is not a consistent meal blood sugars are not analyzed but blood sugars tend to rise gradually between 9 AM and early afternoon Some days he does have a relatively higher spike after breakfast  After lunch:    Blood sugars are relatively flat but they are generally high before starting his bolus in the afternoon After dinner: Blood sugars are relatively constant after dinner with high reading before the meal usually   Self-care:   Meals: 2- 3 meals per day.   breakfast is cheerios or egg and toast.  Will have half sandwich with soup at 1 pm , dinner at  6-7 pm.     Bedtime snack is usually crackers  with milk or have been admitted sandwich         Exercise:  Walking occasionally and exercising including on elliptical for up to 60 minutes, twice a week at the gym  Last  consultation : Most recent: 08/2017      Wt Readings from Last 3 Encounters:  11/19/18 293 lb (132.9 kg)  10/31/18 294 lb (133.4 kg)  07/20/18 288 lb 9.6 oz (130.9 kg)   Glycemic control:   Lab Results  Component Value Date   HGBA1C 9.0 (H) 10/23/2018   HGBA1C 7.9 (H) 07/17/2018   HGBA1C 8.1 (H) 04/02/2018   Lab Results  Component  Value Date   MICROALBUR 11.9 (H) 10/24/2017   LDLCALC 62 07/17/2018   CREATININE 1.96 (H) 10/23/2018            Lab Results  Component Value Date   FRUCTOSAMINE 316 (H) 07/17/2018   FRUCTOSAMINE 319 (H) 12/27/2017   FRUCTOSAMINE 280 10/24/2017   FRUCTOSAMINE 315 (H) 07/21/2017     Other active problems: See review of systems   Allergies as of 11/22/2018  Reactions   Adhesive [tape] Other (See Comments)   blisters      Medication List       Accurate as of November 22, 2018 10:47 AM. If you have any questions, ask your nurse or doctor.        acetaminophen 325 MG tablet Commonly known as: TYLENOL Take 1-2 tablets (325-650 mg total) by mouth every 4 (four) hours as needed for mild pain.   acetic acid 2 % otic solution INSTILL 4 DROPS INTO RIGHT EAR 4 TIMES DAILY FOR 7 DAYS   atorvastatin 10 MG tablet Commonly known as: LIPITOR TAKE 1 TABLET BY MOUTH ONCE DAILY AT  6  PM   calcitRIOL 0.25 MCG capsule Commonly known as: ROCALTROL Take 1 capsule by mouth once daily   carvedilol 25 MG tablet Commonly known as: COREG TAKE 1 TABLET BY MOUTH TWICE DAILY WITH A MEAL   cholecalciferol 1000 units tablet Commonly known as: VITAMIN D Take 1 tablet (1,000 Units total) by mouth daily.   clopidogrel 75 MG tablet Commonly known as: PLAVIX Take 1 tablet by mouth once daily   diclofenac sodium 1 % Gel Commonly known as: VOLTAREN Apply 2 g topically 4 (four) times daily.   hydrALAZINE 100 MG tablet Commonly known as: APRESOLINE Take 100 mg by mouth 3 (three) times daily. Take 1 tablet by mouth three times daily.   insulin regular human CONCENTRATED 500 UNIT/ML injection Commonly known as: HUMULIN R USE 0.25 ML DAILY OR AS DIRECTED IN INSULIN PUMP. DX:E11.65   isosorbide mononitrate 30 MG 24 hr tablet Commonly known as: IMDUR TAKE TWO TABLETS BY MOUTH ONCE DAILY   pantoprazole 40 MG tablet Commonly known as: PROTONIX TAKE ONE TABLET BY MOUTH ONCE DAILY   sertraline  100 MG tablet Commonly known as: ZOLOFT Take 100 mg by mouth daily.   torsemide 10 MG tablet Commonly known as: DEMADEX Take 1 tablet by mouth twice daily   Victoza 18 MG/3ML Sopn Generic drug: liraglutide Inject 0.3 mLs (1.8 mg total) into the skin daily before supper.   vitamin B-12 100 MCG tablet Commonly known as: CYANOCOBALAMIN Take 100 mcg by mouth daily.       Allergies:  Allergies  Allergen Reactions  . Adhesive [Tape] Other (See Comments)    blisters    Past Medical History:  Diagnosis Date  . Acute cholecystitis s/p lap cholecystectomy 03/05/2017 03/04/2017  . Anemia, iron deficiency   . Anxiety   . Arthritis   . Atrial fibrillation with rapid ventricular response ([place])   . BPH (benign prostatic hypertrophy)   . CAD (coronary artery disease)    Nonobstructive CAD per cath  . Cardiac pacemaker in situ   . CHF (congestive heart failure) ([place])   . Chronic ulcer of right foot ([place])   . CKD (chronic kidney disease) stage 4, GFR 15-29 ml/min (HCC) 09/26/2017  . CKD (chronic kidney disease), stage III ([place]) secondary to DM and HTN   nephrologist-  [name]  . Coronary artery disease involving native coronary artery of native heart without angina pectoris   . Diastolic dysfunction   . Dyspnea   . 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 History of cellulitis    right great toe 10-25-2014  . History of skin cancer   . HOCM (hypertrophic obstructive cardiomyopathy) ([place])   . Hyperlipidemia associated with type 2 diabetes mellitus ([place]) 10/25/2013  . Hypertension   . Hypertension associated with diabetes ([place]) 10/25/2013  .  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 Insulin dependent type 2 diabetes mellitus ([place]) 1991   followd by [name]--  has insulin pump  . Insulin pump in place   . OSA on CPAP   . 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 Peripheral neuropathy    severe  . Peripheral vascular  disease ([place])    bilateral lower extremities  . PVD (peripheral vascular disease) ([place])   . Rib fracture 07/24/2015  . Seasonal and perennial allergic rhinitis 10/25/2013  . Seborrheic keratosis 09/19/2016  . Secondary hyperparathyroidism of renal origin ([place])   . 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 Sinus node dysfunction (H[facility])   . Sleep apnea   . Syncope 07/24/2015    Past Surgical History:  Procedure Laterality Date  . AMPUTATION OF REPLICATED TOES  Mar 9476   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)  . AMPUTATION TOE Right 03/12/2015   Procedure: RIGHT HALLUS AMPUTATION ;  Surgeon: [name], MD;  Location: [facility];  Service: Podiatry;  Laterality: Right;  . CARDIAC CATHETERIZATION  11-25-2010   [place], [place]   Nonobstructive CAD  . CARDIAC PACEMAKER PLACEMENT  Nov 2009   Medtronic  . CHOLECYSTECTOMY N/A 03/05/2017   Procedure: LAPAROSCOPIC CHOLECYSTECTOMY WITH INTRAOPERATIVE CHOLANGIOGRAM;  Surgeon: [name], MD;  Location: [facility];  Service: General;  Laterality: N/A;  . EP IMPLANTABLE DEVICE N/A 06/03/2015   Procedure: PPM Generator Changeout;  Surgeon: [name], MD;  Location: [facility];  Service: Cardiovascular;  Laterality: N/A;  . EXCISION BONE CYST Right 03/06/2015   Procedure: BONE BIOPSIES OF RIGHT FOOT;  Surgeon: [name], MD;  Location: [facility];  Service: Podiatry;  Laterality: Right;  . ORIF ANKLE FRACTURE Left 11/06/2014   Procedure: OPEN REDUCTION INTERNAL FIXATION (ORIF) LEFT  ANKLE FRACTURE;  Surgeon: [name], MD;  Location: [facility];  Service: Orthopedics;  Laterality: Left;  . TEE WITHOUT CARDIOVERSION N/A 09/01/2017   Procedure: TRANSESOPHAGEAL ECHOCARDIOGRAM (TEE);  Surgeon: [name], MD;  Location: [facility];  Service: Cardiovascular;  Laterality: N/A;  . TOTAL KNEE ARTHROPLASTY    . VEIN LIGATION AND STRIPPING      Family History  Problem Relation Age of Onset  . Cancer Mother         breast  . Heart attack Father   . Stroke Father     Social History:  reports that he quit smoking about 34 years ago. He has a 60.00 pack-year smoking history. He has never used smokeless tobacco. He reports current alcohol use of about 1.0 standard drinks of alcohol per week. He reports that he does not use drugs.    Review of Systems    The following is a copy of the previous note      Lipids: Has been on and off Lipitor since late 2014. Also had taken Gembrozil for several years without apparent side effects.  LDL is below 70 Previously had high ALT with Lipitor but not currently  HDL is consistently low  He is taking  fish oil OTC and triglycerides are below 200   Lab Results  Component Value Date   CHOL 122 07/17/2018   CHOL 123 12/27/2017   CHOL 165 08/31/2017   Lab Results  Component Value Date   HDL 25.80 (L) 07/17/2018   HDL 23.60 (L) 12/27/2017   HDL 20 (L) 08/31/2017   Lab Results  Component Value Date   LDLCALC 62 07/17/2018  [place] 106 (H) 08/31/2017   LDLCALC 94 02/17/2017   Lab Results  Component Value Date   TRIG 168.0 (H) 07/17/2018   TRIG 281.0 (H) 12/27/2017   TRIG 195 (H) 08/31/2017   Lab Results  Component Value Date   CHOLHDL 5 07/17/2018   CHOLHDL 5 12/27/2017   CHOLHDL 8.3 08/31/2017   Lab Results  Component Value Date   LDLDIRECT 50.0 12/27/2017   LDLDIRECT 76.0 07/21/2017   LDLDIRECT 89.0 05/23/2017    Lab Results  Component Value Date   ALT 28 07/17/2018       HYPERTENSION: The blood pressure has been treated with various drugs including carvedilol 25 mg by his other physicians.  Blood pressure is appearing better controlled overall  BP Readings from Last 3 Encounters:  11/19/18 138/84  11/11/18 (!) 148/60  10/31/18 130/68     Chronic kidney disease: Etiology unknown, is followed by nephrologist  Creatinine ranges:  Lab Results  Component Value Date   CREATININE 1.96 (H) 10/23/2018   CREATININE 2.07  (H) 07/17/2018   CREATININE 2.2 (A) 05/24/2018         Has history of Numbness in his feet  since about 2013  He has been wearing diabetic shoes.  He has been followed by podiatrist He is  under the care of a podiatrist   Vitamin B12 deficiency: Well managed with monthly injections  Lab Results  Component Value Date   [id number] 026 07/17/2018       Physical Examination:  There were no vitals taken for this visit.    ASSESSMENT:  Diabetes type 2, uncontrolled with obesity See history of present illness for detailed discussion of current diabetes management, blood sugar patterns and problems identified   He is on the U-500 insulin with the Medtronic pump and now using the Dexcom sensor  Last A1c 9%  His blood sugars are overall much better except when he was on the steroids for his knee until 7/3 With increasing his overnight basal rate is early morning blood sugars are starting to be low normal now with 1 episode of low sugar in the morning also  PLAN:   Basal rate settings will be changed only at midnight down to 0.55 Continue 1.8 Victoza Also if he has not bolused for the full amount for his meals he can do a supplemental bolus His wife will help him with management   There are no Patient Instructions on file for this visit.    Elayne Snare 11/22/2018, 10:47 AM   Note: This office note was prepared with Dragon voice recognition system technology. Any transcriptional errors that result from this process are unintentional.

## 2018-11-22 NOTE — Telephone Encounter (Signed)
Please arrange phone appt. At 11.30 today

## 2018-11-22 NOTE — Telephone Encounter (Signed)
Pt has been scheduled.  °

## 2018-11-23 DIAGNOSIS — E113313 Type 2 diabetes mellitus with moderate nonproliferative diabetic retinopathy with macular edema, bilateral: Secondary | ICD-10-CM | POA: Diagnosis not present

## 2018-11-28 ENCOUNTER — Ambulatory Visit: Payer: Medicare Other | Admitting: Family Medicine

## 2018-12-03 ENCOUNTER — Ambulatory Visit (INDEPENDENT_AMBULATORY_CARE_PROVIDER_SITE_OTHER): Payer: Medicare Other | Admitting: *Deleted

## 2018-12-03 DIAGNOSIS — I495 Sick sinus syndrome: Secondary | ICD-10-CM

## 2018-12-03 LAB — CUP PACEART REMOTE DEVICE CHECK
Battery Remaining Longevity: 61 mo
Battery Voltage: 3 V
Brady Statistic AP VP Percent: 0.05 %
Brady Statistic AP VS Percent: 99.8 %
Brady Statistic AS VP Percent: 0 %
Brady Statistic AS VS Percent: 0.15 %
Brady Statistic RA Percent Paced: 99.83 %
Brady Statistic RV Percent Paced: 0.05 %
Date Time Interrogation Session: 20200717230207
Implantable Lead Implant Date: 20091102
Implantable Lead Implant Date: 20091102
Implantable Lead Location: 753859
Implantable Lead Location: 753860
Implantable Lead Model: 5076
Implantable Lead Model: 5076
Implantable Pulse Generator Implant Date: 20170118
Lead Channel Impedance Value: 1216 Ohm
Lead Channel Impedance Value: 399 Ohm
Lead Channel Impedance Value: 437 Ohm
Lead Channel Impedance Value: 570 Ohm
Lead Channel Pacing Threshold Amplitude: 1.75 V
Lead Channel Pacing Threshold Amplitude: 2.125 V
Lead Channel Pacing Threshold Pulse Width: 0.4 ms
Lead Channel Pacing Threshold Pulse Width: 0.4 ms
Lead Channel Sensing Intrinsic Amplitude: 1.5 mV
Lead Channel Sensing Intrinsic Amplitude: 1.5 mV
Lead Channel Sensing Intrinsic Amplitude: 25 mV
Lead Channel Sensing Intrinsic Amplitude: 25 mV
Lead Channel Setting Pacing Amplitude: 2.5 V
Lead Channel Setting Pacing Amplitude: 4.25 V
Lead Channel Setting Pacing Pulse Width: 0.4 ms
Lead Channel Setting Sensing Sensitivity: 2.8 mV

## 2018-12-17 ENCOUNTER — Encounter: Payer: Self-pay | Admitting: Cardiology

## 2018-12-17 NOTE — Progress Notes (Signed)
Remote pacemaker transmission.   

## 2018-12-24 DIAGNOSIS — H43813 Vitreous degeneration, bilateral: Secondary | ICD-10-CM | POA: Diagnosis not present

## 2018-12-24 DIAGNOSIS — H35373 Puckering of macula, bilateral: Secondary | ICD-10-CM | POA: Diagnosis not present

## 2018-12-24 DIAGNOSIS — E113313 Type 2 diabetes mellitus with moderate nonproliferative diabetic retinopathy with macular edema, bilateral: Secondary | ICD-10-CM | POA: Diagnosis not present

## 2018-12-28 ENCOUNTER — Other Ambulatory Visit: Payer: Self-pay

## 2018-12-28 ENCOUNTER — Other Ambulatory Visit (INDEPENDENT_AMBULATORY_CARE_PROVIDER_SITE_OTHER): Payer: Medicare Other

## 2018-12-28 DIAGNOSIS — Z794 Long term (current) use of insulin: Secondary | ICD-10-CM | POA: Diagnosis not present

## 2018-12-28 DIAGNOSIS — E1165 Type 2 diabetes mellitus with hyperglycemia: Secondary | ICD-10-CM

## 2018-12-28 LAB — BASIC METABOLIC PANEL
BUN: 35 mg/dL — ABNORMAL HIGH (ref 6–23)
CO2: 26 mEq/L (ref 19–32)
Calcium: 9.3 mg/dL (ref 8.4–10.5)
Chloride: 108 mEq/L (ref 96–112)
Creatinine, Ser: 1.76 mg/dL — ABNORMAL HIGH (ref 0.40–1.50)
GFR: 37.6 mL/min — ABNORMAL LOW (ref >60.00)
Glucose, Bld: 201 mg/dL — ABNORMAL HIGH (ref 70–99)
Potassium: 4.5 mEq/L (ref 3.5–5.1)
Sodium: 141 mEq/L (ref 135–145)

## 2018-12-29 LAB — FRUCTOSAMINE: Fructosamine: 295 umol/L — ABNORMAL HIGH (ref 0–285)

## 2019-01-01 ENCOUNTER — Ambulatory Visit (INDEPENDENT_AMBULATORY_CARE_PROVIDER_SITE_OTHER): Payer: Medicare Other | Admitting: Podiatry

## 2019-01-01 ENCOUNTER — Other Ambulatory Visit: Payer: Self-pay

## 2019-01-01 ENCOUNTER — Encounter: Payer: Self-pay | Admitting: Podiatry

## 2019-01-01 ENCOUNTER — Ambulatory Visit (INDEPENDENT_AMBULATORY_CARE_PROVIDER_SITE_OTHER): Payer: Medicare Other

## 2019-01-01 VITALS — Temp 97.8°F

## 2019-01-01 DIAGNOSIS — E08621 Diabetes mellitus due to underlying condition with foot ulcer: Secondary | ICD-10-CM

## 2019-01-01 DIAGNOSIS — L97521 Non-pressure chronic ulcer of other part of left foot limited to breakdown of skin: Secondary | ICD-10-CM

## 2019-01-01 DIAGNOSIS — E0842 Diabetes mellitus due to underlying condition with diabetic polyneuropathy: Secondary | ICD-10-CM | POA: Diagnosis not present

## 2019-01-01 DIAGNOSIS — B351 Tinea unguium: Secondary | ICD-10-CM | POA: Diagnosis not present

## 2019-01-01 DIAGNOSIS — Z899 Acquired absence of limb, unspecified: Secondary | ICD-10-CM

## 2019-01-01 NOTE — Progress Notes (Signed)
Subjective: Patient presents today for preventative diabetic foot care. He states he injured his left 2nd digit. He and his wife believe the bedsheets rubbed a blister on it about 2 weeks ago. It has since deflated and he has a scab on the digit. He has been applying Mupirocin Ointment to it daily and says it is doing well. He denies any increased redness, drainage or swelling of digit. He denies any fever, chills, nightsweats, nausea or vomiting.  [name], MD is his PCP.   Current Outpatient Medications:  .  acetaminophen (TYLENOL) 325 MG tablet, Take 1-2 tablets (325-650 mg total) by mouth every 4 (four) hours as needed for mild pain., Disp: , Rfl:  .  acetic acid 2 % otic solution, INSTILL 4 DROPS INTO RIGHT EAR 4 TIMES DAILY FOR 7 DAYS, Disp: , Rfl: 0 .  atorvastatin (LIPITOR) 10 MG tablet, TAKE 1 TABLET BY MOUTH ONCE DAILY AT  6  PM, Disp: 90 tablet, Rfl: 0 .  calcitRIOL (ROCALTROL) 0.25 MCG capsule, Take 1 capsule by mouth once daily, Disp: 90 capsule, Rfl: 0 .  carvedilol (COREG) 25 MG tablet, TAKE 1 TABLET BY MOUTH TWICE DAILY WITH A MEAL, Disp: 180 tablet, Rfl: 0 .  cholecalciferol (VITAMIN D) 1000 units tablet, Take 1 tablet (1,000 Units total) by mouth daily., Disp: 30 tablet, Rfl: 1 .  clopidogrel (PLAVIX) 75 MG tablet, Take 1 tablet by mouth once daily, Disp: 90 tablet, Rfl: 0 .  diclofenac sodium (VOLTAREN) 1 % GEL, Apply 2 g topically 4 (four) times daily., Disp: 150 g, Rfl: 0 .  hydrALAZINE (APRESOLINE) 100 MG tablet, Take 100 mg by mouth 3 (three) times daily. Take 1 tablet by mouth three times daily., Disp: , Rfl:  .  insulin regular human CONCENTRATED (HUMULIN R) 500 UNIT/ML injection, USE 0.25 ML DAILY OR AS DIRECTED IN INSULIN PUMP. DX:E11.65, Disp: 20 mL, Rfl: 2 .  isosorbide mononitrate (IMDUR) 30 MG 24 hr tablet, TAKE TWO TABLETS BY MOUTH ONCE DAILY, Disp: 60 tablet, Rfl: 0 .  liraglutide (VICTOZA) 18 MG/3ML SOPN, Inject 0.3 mLs (1.8 mg total) into the skin daily  before supper., Disp: 9 pen, Rfl: 1 .  pantoprazole (PROTONIX) 40 MG tablet, TAKE ONE TABLET BY MOUTH ONCE DAILY, Disp: 90 tablet, Rfl: 1 .  sertraline (ZOLOFT) 100 MG tablet, Take 100 mg by mouth daily., Disp: , Rfl:  .  torsemide (DEMADEX) 10 MG tablet, Take 1 tablet by mouth twice daily, Disp: 180 tablet, Rfl: 0 .  vitamin B-12 (CYANOCOBALAMIN) 100 MCG tablet, Take 100 mcg by mouth daily., Disp: , Rfl:    Allergies  Allergen Reactions  . Adhesive [Tape] Other (See Comments)    blisters     Objective: Vitals:   01/01/19 1456  Temp: 97.8 F (36.6 C)    Vascular Examination: Capillary refill time <4 seconds x 7 digits.  Dorsalis pedis pulses 1/4 b/l.  Posterior tibial pulses nonpalpable b/l.  Digital hair absent b/l.  Skin temperature gradient WNL b/l.  +1 pitting edema BLE. No warmth.   Dermatological Examination: Skin thin, shiny and atrophic b/l.  Toenails right 5th,  and 2-5 left  discolored, thick, dystrophic with subungual debris and pain with palpation to nailbeds due to thickness of nails.      Ulceration located dorsal aspect left 2nd digit. Scab noted dorsal PIPJ 1.5 x 1.0 cm and attached to digit. No surrounding erythema, no edema, no drainage, no flocculence.  Healing eschar noted on distal tip right  3rd digit.  Musculoskeletal: Muscle strength 5/5 to all LE muscle groups.  Partial hallux amputation, 2nd digit amputation, partial amputation right 3rd digit  Neurological: Sensation absent with 10 gram monofilament.  Vibratory sensation intact.  Xrays left foot reveal no signs of bone erosion nor gas in tissues.  Assessment: 1. Painful onychomycosis toenails right 5th and 2-5 left 2. Diabetic Ulceration left 2nd digit 3. 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 NIDDM with peripheral neuropathy  Plan: 1. Toenails right 5th and 2-5 left were debrided in length and girth without  iatrogenic bleeding. 2. Ulcer was evaluated and found to be stable and healing. Continue Mupirocin Ointment to digit once daily. 3. Xray of the left foot was performed and reviewed with Mr. Vidaurri.  4. Patient was given instructions on offloading and dressing change/aftercare and was instructed to call immediately if any signs or symptoms of infection arise.  5. Patient instructed to report to emergency department with worsening appearance of ulcer/toe/foot, increased pain, foul odor, increased redness, swelling, drainage, fever, chills, nightsweats, nausea, vomiting, increased blood sugar.  6. Patient/POA related understanding. 7. Follow up 2 weeks. 8. Patient/POA to call should there be a concern in the interim.

## 2019-01-01 NOTE — Patient Instructions (Signed)
Diabetes Mellitus and Foot Care Foot care is an important part of your health, especially when you have diabetes. Diabetes may cause you to have problems because of poor blood flow (circulation) to your feet and legs, which can cause your skin to:  Become thinner and drier.  Break more easily.  Heal more slowly.  Peel and crack. You may also have nerve damage (neuropathy) in your legs and feet, causing decreased feeling in them. This means that you may not notice minor injuries to your feet that could lead to more serious problems. Noticing and addressing any potential problems early is the best way to prevent future foot problems. How to care for your feet Foot hygiene  Wash your feet daily with warm water and mild soap. Do not use hot water. Then, pat your feet and the areas between your toes until they are completely dry. Do not soak your feet as this can dry your skin.  Trim your toenails straight across. Do not dig under them or around the cuticle. File the edges of your nails with an emery board or nail file.  Apply a moisturizing lotion or petroleum jelly to the skin on your feet and to dry, brittle toenails. Use lotion that does not contain alcohol and is unscented. Do not apply lotion between your toes. Shoes and socks  Wear clean socks or stockings every day. Make sure they are not too tight. Do not wear knee-high stockings since they may decrease blood flow to your legs.  Wear shoes that fit properly and have enough cushioning. Always look in your shoes before you put them on to be sure there are no objects inside.  To break in new shoes, wear them for just a few hours a day. This prevents injuries on your feet. Wounds, scrapes, corns, and calluses  Check your feet daily for blisters, cuts, bruises, sores, and redness. If you cannot see the bottom of your feet, use a mirror or ask someone for help.  Do not cut corns or calluses or try to remove them with medicine.  If you  find a minor scrape, cut, or break in the skin on your feet, keep it and the skin around it clean and dry. You may clean these areas with mild soap and water. Do not clean the area with peroxide, alcohol, or iodine.  If you have a wound, scrape, corn, or callus on your foot, look at it several times a day to make sure it is healing and not infected. Check for: ? Redness, swelling, or pain. ? Fluid or blood. ? Warmth. ? Pus or a bad smell. General instructions  Do not cross your legs. This may decrease blood flow to your feet.  Do not use heating pads or hot water bottles on your feet. They may burn your skin. If you have lost feeling in your feet or legs, you may not know this is happening until it is too late.  Protect your feet from hot and cold by wearing shoes, such as at the beach or on hot pavement.  Schedule a complete foot exam at least once a year (annually) or more often if you have foot problems. If you have foot problems, report any cuts, sores, or bruises to your health care provider immediately. Contact a health care provider if:  You have a medical condition that increases your risk of infection and you have any cuts, sores, or bruises on your feet.  You have an injury that is not   healing.  You have redness on your legs or feet.  You feel burning or tingling in your legs or feet.  You have pain or cramps in your legs and feet.  Your legs or feet are numb.  Your feet always feel cold.  You have pain around a toenail. Get help right away if:  You have a wound, scrape, corn, or callus on your foot and: ? You have pain, swelling, or redness that gets worse. ? You have fluid or blood coming from the wound, scrape, corn, or callus. ? Your wound, scrape, corn, or callus feels warm to the touch. ? You have pus or a bad smell coming from the wound, scrape, corn, or callus. ? You have a fever. ? You have a red line going up your leg. Summary  Check your feet every day  for cuts, sores, red spots, swelling, and blisters.  Moisturize feet and legs daily.  Wear shoes that fit properly and have enough cushioning.  If you have foot problems, report any cuts, sores, or bruises to your health care provider immediately.  Schedule a complete foot exam at least once a year (annually) or more often if you have foot problems. This information is not intended to replace advice given to you by your health care provider. Make sure you discuss any questions you have with your health care provider. Document Released: 04/29/2000 Document Revised: 06/14/2017 Document Reviewed: 06/03/2016 Elsevier Patient Education  2020 [employer]   Onychomycosis/Fungal Toenails  WHAT IS IT? An infection that lies within the keratin of your nail plate that is caused by a fungus.  WHY ME? Fungal infections affect all ages, sexes, races, and creeds.  There may be many factors that predispose you to a fungal infection such as age, coexisting medical conditions such as diabetes, or an autoimmune disease; stress, medications, fatigue, genetics, etc.  Bottom line: fungus thrives in a warm, moist environment and your shoes offer such a location.  IS IT CONTAGIOUS? Theoretically, yes.  You do not want to share shoes, nail clippers or files with someone who has fungal toenails.  Walking around barefoot in the same room or sleeping in the same bed is unlikely to transfer the organism.  It is important to realize, however, that fungus can spread easily from one nail to the next on the same foot.  HOW DO WE TREAT THIS?  There are several ways to treat this condition.  Treatment may depend on many factors such as age, medications, pregnancy, liver and kidney conditions, etc.  It is best to ask your doctor which options are available to you.  1. No treatment.   Unlike many other medical concerns, you can live with this condition.  However for many people this can be a painful condition and may lead to  ingrown toenails or a bacterial infection.  It is recommended that you keep the nails cut short to help reduce the amount of fungal nail. 2. Topical treatment.  These range from herbal remedies to prescription strength nail lacquers.  About 40-50% effective, topicals require twice daily application for approximately 9 to 12 months or until an entirely new nail has grown out.  The most effective topicals are medical grade medications available through physicians offices. 3. Oral antifungal medications.  With an 80-90% cure rate, the most common oral medication requires 3 to 4 months of therapy and stays in your system for a year as the new nail grows out.  Oral antifungal medications do require   blood work to make sure it is a safe drug for you.  A liver function panel will be performed prior to starting the medication and after the first month of treatment.  It is important to have the blood work performed to avoid any harmful side effects.  In general, this medication safe but blood work is required. 4. Laser Therapy.  This treatment is performed by applying a specialized laser to the affected nail plate.  This therapy is noninvasive, fast, and non-painful.  It is not covered by insurance and is therefore, out of pocket.  The results have been very good with a 80-95% cure rate.  [facility] is the only practice in the area to offer this therapy. 5. Permanent Nail Avulsion.  Removing the entire nail so that a new nail will not grow back. 

## 2019-01-02 ENCOUNTER — Ambulatory Visit (INDEPENDENT_AMBULATORY_CARE_PROVIDER_SITE_OTHER): Payer: Medicare Other | Admitting: Endocrinology

## 2019-01-02 ENCOUNTER — Telehealth: Payer: Self-pay

## 2019-01-02 ENCOUNTER — Encounter: Payer: Self-pay | Admitting: Endocrinology

## 2019-01-02 VITALS — BP 148/68 | HR 79 | Ht 76.0 in | Wt 290.6 lb

## 2019-01-02 DIAGNOSIS — I639 Cerebral infarction, unspecified: Secondary | ICD-10-CM

## 2019-01-02 DIAGNOSIS — E1165 Type 2 diabetes mellitus with hyperglycemia: Secondary | ICD-10-CM

## 2019-01-02 DIAGNOSIS — Z794 Long term (current) use of insulin: Secondary | ICD-10-CM

## 2019-01-02 DIAGNOSIS — E782 Mixed hyperlipidemia: Secondary | ICD-10-CM

## 2019-01-02 NOTE — Telephone Encounter (Signed)
Patients wife David [name] returned my call, she is ok with video visit.     Virtual Visit Pre-Appointment Phone Call  "(Name), I am calling you today to discuss your upcoming appointment. We are currently trying to limit exposure to the virus that causes COVID-19 by seeing patients at home rather than in the office."  1. "What is the BEST phone number to call the day of the visit?" - include this in appointment notes  2. "Do you have or have access to (through a family member/friend) a smartphone with video capability that we can use for your visit?" a. If yes - list this number in appt notes as "cell" (if different from BEST phone #) and list the appointment type as a VIDEO visit in appointment notes b. If no - list the appointment type as a PHONE visit in appointment notes  3. Confirm consent - "In the setting of the current Covid19 crisis, you are scheduled for a (phone or video) visit with your provider on (date) at (time).  Just as we do with many in-office visits, in order for you to participate in this visit, we must obtain consent.  If you'd like, I can send this to your mychart (if signed up) or email for you to review.  Otherwise, I can obtain your verbal consent now.  All virtual visits are billed to your insurance company just like a normal visit would be.  By agreeing to a virtual visit, we'd like you to understand that the technology does not allow for your provider to perform an examination, and thus may limit your provider's ability to fully assess your condition. If your provider identifies any concerns that need to be evaluated in person, we will make arrangements to do so.  Finally, though the technology is pretty good, we cannot assure that it will always work on either your or our end, and in the setting of a video visit, we may have to convert it to a phone-only visit.  In either situation, we cannot ensure that we have a secure connection.  Are you willing to proceed?" STAFF: Did the  patient verbally acknowledge consent to telehealth visit? Document YES/NO here: YES  4. Advise patient to be prepared - "Two hours prior to your appointment, go ahead and check your blood pressure, pulse, oxygen saturation, and your weight (if you have the equipment to check those) and write them all down. When your visit starts, your provider will ask you for this information. If you have an Apple Watch or Kardia device, please plan to have heart rate information ready on the day of your appointment. Please have a pen and paper handy nearby the day of the visit as well."  5. Give patient instructions for MyChart download to smartphone OR Doximity/Doxy.me as below if video visit (depending on what platform provider is using)  6. Inform patient they will receive a phone call 15 minutes prior to their appointment time (may be from unknown caller ID) so they should be prepared to answer    TELEPHONE CALL NOTE  David [name] has been deemed a candidate for a follow-up tele-health visit to limit community exposure during the Covid-19 pandemic. I spoke with the patient via phone to ensure availability of phone/video source, confirm preferred email & phone number, and discuss instructions and expectations.  I reminded David [name] to be prepared with any vital sign and/or heart rhythm information that could potentially be obtained via home monitoring, at the time of  his visit. I reminded David [name] to expect a phone call prior to his visit.  Mady Haagensen, [place] 01/02/2019 9:37 AM   INSTRUCTIONS FOR DOWNLOADING THE MYCHART APP TO SMARTPHONE  - The patient must first make sure to have activated MyChart and know their login information - If Apple, go to [employer] and type in MyChart in the search bar and download the app. If Android, ask patient to go to [employer] and type in [place] in the search bar and download the app. The app is free but as with any other app downloads, their  phone may require them to verify saved payment information or Apple/Android password.  - The patient will need to then log into the app with their MyChart username and password, and select [facility] as their healthcare provider to link the account. When it is time for your visit, go to the MyChart app, find appointments, and click Begin Video Visit. Be sure to Select Allow for your device to access the Microphone and Camera for your visit. You will then be connected, and your provider will be with you shortly.  **If they have any issues connecting, or need assistance please contact MyChart service desk [phone])**  **If using a computer, in order to ensure the best quality for their visit they will need to use either of the following Internet Browsers: [employer], or Google Chrome**  IF USING DOXIMITY or DOXY.ME - The patient will receive a link just prior to their visit by text.     FULL LENGTH CONSENT FOR TELE-HEALTH VISIT   I hereby voluntarily request, consent and authorize [facility] and its employed or contracted physicians, physician assistants, nurse practitioners or other licensed health care professionals (the Practitioner), to provide me with telemedicine health care services (the "Services") as deemed necessary by the treating Practitioner. I acknowledge and consent to receive the Services by the Practitioner via telemedicine. I understand that the telemedicine visit will involve communicating with the Practitioner through live audiovisual communication technology and the disclosure of certain medical information by electronic transmission. I acknowledge that I have been given the opportunity to request an in-person assessment or other available alternative prior to the telemedicine visit and am voluntarily participating in the telemedicine visit.  I understand that I have the right to withhold or withdraw my consent to the use of telemedicine in the course of  my care at any time, without affecting my right to future care or treatment, and that the Practitioner or I may terminate the telemedicine visit at any time. I understand that I have the right to inspect all information obtained and/or recorded in the course of the telemedicine visit and may receive copies of available information for a reasonable fee.  I understand that some of the potential risks of receiving the Services via telemedicine include:  Marland Kitchen Delay or interruption in medical evaluation due to technological equipment failure or disruption; . Information transmitted may not be sufficient (e.g. poor resolution of images) to allow for appropriate medical decision making by the Practitioner; and/or  . In rare instances, security protocols could fail, causing a breach of personal health information.  Furthermore, I acknowledge that it is my responsibility to provide information about my medical history, conditions and care that is complete and accurate to the best of my ability. I acknowledge that Practitioner's advice, recommendations, and/or decision may be based on factors not within their control, such as incomplete or inaccurate data provided by  me or distortions of diagnostic images or specimens that may result from electronic transmissions. I understand that the practice of medicine is not an exact science and that Practitioner makes no warranties or guarantees regarding treatment outcomes. I acknowledge that I will receive a copy of this consent concurrently upon execution via email to the email address I last provided but may also request a printed copy by calling the office of [facility].    I understand that my insurance will be billed for this visit.   I have read or had this consent read to me. . I understand the contents of this consent, which adequately explains the benefits and risks of the Services being provided via telemedicine.  . I have been provided ample opportunity to ask  questions regarding this consent and the Services and have had my questions answered to my satisfaction. . I give my informed consent for the services to be provided through the use of telemedicine in my medical care  By participating in this telemedicine visit I agree to the above.

## 2019-01-02 NOTE — Progress Notes (Signed)
Patient ID: David [name], male   DOB: 1941-04-13, 78 y.o.   MRN: [id number]    Reason for Appointment: Followup for Type 2 Diabetes   History of Present Illness:          Diagnosis: Type 2 diabetes mellitus, date of diagnosis:   1992       Past history:  He was initially treated with metformin and at some point also glipizide. Apparently he was started on insulin in 1994 approximately because of poor control He has been on various insulin regimens over the last several years However even with insulin he has had poor control for at least the last 7 or 8 years. He does not know what his previous A1c levels have been. He had been continued on metformin and glipizide but metformin stopped because of kidney function abnormality He had been taking Lantus 60 units twice a day with NovoLog previously and also Before his initial consultation in 6/15 he was on NovoLog twice a day and Humalog mix insulin  Because of poor control and large insulin doses he was started on Victoza in 6/15  Since 04/28/14 he had been on a Medtronic insulin pump because of persistent poor control and high insulin requirement  Recent history:   Medtronic 723 PUMP  regimen with U-500 insulin as follows  Carb ratio and 1: 10 Target 130-150  BASAL rates:Midnight = 0.55, 6 AM = 0.75, 11 AM = 0.95 and 9 PM = 0.65    I/C ratio: 1: 10, ISF: 50, target 150-150.  Active insulin 6 hours  Non-insulin hypoglycemic drugs the patient is taking are: VICTOZA 1.8mg  daily  Has had A1c's of mostly over 8%, last 9 Fructosamine is 297  Current management, problems and blood sugar patterns:  His Dexcom has not been working for the last 5 days  Appears to have difficulty remembering to do his boluses more recently and has very few boluses in the last 2 weeks  Most of his hyperglycemia appears to be postprandial after lunch and dinner.  He will then bolus later at night when the blood sugar is higher  Again does  not explain why he does not bolus when he is eating even though he has some help from his family members  Also not always estimating his carbohydrates accurately and reported only 25 g of carbohydrate for his egg sandwich this morning  He is still because of being busy with getting ready to move he has not been paying attention to his diabetes as much  Not clear if his boluses are adequate because blood sugars tend to come down slowly with correction boluses      CONTINUOUS GLUCOSE MONITORING RECORD INTERPRETATION    Dates of Recording: 8/1 through 8/14  Sensor description: Dexcom  Results statistics:   CGM use % of time  94  Average and SD  180+/-47  Time in range       49 %  % Time Above 180  51  % Time above 250  7.6  % Time Below target  0    Glycemic patterns summary: His blood sugars are averaging around 180 throughout the day with more fluctuation in the evenings around dinnertime Generally has higher readings starting around 7 PM continue 2 AM Rarely may have low normal readings around 7 AM or 7 PM  Hyperglycemic episodes are occurring intermittently in the evenings after dinner but also occasionally has persistently high readings through the night or late afternoon  Hypoglycemic episodes have been minimal with only low normal blood sugars even though he thinks that he has occasional blood sugars in the 60s.  These are not documented in his pump  Overnight periods: Blood sugars are quite variable but appear to be mostly higher in the last week and usually averaging around 200 in the last week.  No hypoglycemia  Preprandial periods: Blood sugars are not averaging about 180 before most of his meals except before dinnertime a week ago his blood sugars may have been low normal on 3 or 4 occasions  Postprandial periods:   After breakfast:   Usually does not have a meal but blood sugars may rise occasionally  After lunch:   Glucose has been mostly tend to rise modestly and  week 1 but not last week as much  After dinner: Blood sugars are going up on an average at least 40 to 50 mg compared to pre-meal blood sugars and more often in the last week when they are averaging about 200        Previous data:    CGM use % of time   2-week average/SD  182+/-66  Time in range  53      % was 39  % Time Above 180  45  % Time above 250  18  % Time Below 70  1.8    Self-care:   Meals: 2- 3 meals per day.   breakfast is cheerios or egg and toast.  Will have half sandwich with soup at 1 pm , dinner at  6-7 pm.     Bedtime snack is usually crackers  with milk or have been admitted sandwich         Exercise:  Walking occasionally and exercising including on elliptical for up to 60 minutes, twice a week at the gym  Last  consultation : Most recent: 08/2017      Wt Readings from Last 3 Encounters:  01/02/19 290 lb 9.6 oz (131.8 kg)  11/19/18 293 lb (132.9 kg)  10/31/18 294 lb (133.4 kg)   Glycemic control:   Lab Results  Component Value Date   HGBA1C 9.0 (H) 10/23/2018   HGBA1C 7.9 (H) 07/17/2018   HGBA1C 8.1 (H) 04/02/2018   Lab Results  Component Value Date   MICROALBUR 11.9 (H) 10/24/2017   LDLCALC 62 07/17/2018   CREATININE 1.76 (H) 12/28/2018            Lab Results  Component Value Date   FRUCTOSAMINE 295 (H) 12/28/2018   FRUCTOSAMINE 316 (H) 07/17/2018   FRUCTOSAMINE 319 (H) 12/27/2017   FRUCTOSAMINE 280 10/24/2017     Other active problems: See review of systems   Allergies as of 01/02/2019      Reactions   Adhesive [tape] Other (See Comments)   blisters      Medication List       Accurate as of January 02, 2019  2:20 PM. If you have any questions, ask your nurse or doctor.        acetaminophen 325 MG tablet Commonly known as: TYLENOL Take 1-2 tablets (325-650 mg total) by mouth every 4 (four) hours as needed for mild pain.   acetic acid 2 % otic solution INSTILL 4 DROPS INTO RIGHT EAR 4 TIMES DAILY FOR 7 DAYS   atorvastatin 10  MG tablet Commonly known as: LIPITOR TAKE 1 TABLET BY MOUTH ONCE DAILY AT  6  PM   calcitRIOL 0.25 MCG capsule Commonly known as: ROCALTROL Take  1 capsule by mouth once daily   carvedilol 25 MG tablet Commonly known as: COREG TAKE 1 TABLET BY MOUTH TWICE DAILY WITH A MEAL   cholecalciferol 1000 units tablet Commonly known as: VITAMIN D Take 1 tablet (1,000 Units total) by mouth daily.   clopidogrel 75 MG tablet Commonly known as: PLAVIX Take 1 tablet by mouth once daily   diclofenac sodium 1 % Gel Commonly known as: VOLTAREN Apply 2 g topically 4 (four) times daily.   hydrALAZINE 100 MG tablet Commonly known as: APRESOLINE Take 100 mg by mouth 3 (three) times daily. Take 1 tablet by mouth three times daily.   insulin regular human CONCENTRATED 500 UNIT/ML injection Commonly known as: HUMULIN R USE 0.25 ML DAILY OR AS DIRECTED IN INSULIN PUMP. DX:E11.65   isosorbide mononitrate 30 MG 24 hr tablet Commonly known as: IMDUR TAKE TWO TABLETS BY MOUTH ONCE DAILY   pantoprazole 40 MG tablet Commonly known as: PROTONIX TAKE ONE TABLET BY MOUTH ONCE DAILY   sertraline 100 MG tablet Commonly known as: ZOLOFT Take 100 mg by mouth daily.   torsemide 10 MG tablet Commonly known as: DEMADEX Take 1 tablet by mouth twice daily   Victoza 18 MG/3ML Sopn Generic drug: liraglutide Inject 0.3 mLs (1.8 mg total) into the skin daily before supper.   vitamin B-12 100 MCG tablet Commonly known as: CYANOCOBALAMIN Take 100 mcg by mouth daily.       Allergies:  Allergies  Allergen Reactions  . Adhesive [Tape] Other (See Comments)    blisters    Past Medical History:  Diagnosis Date  . Acute cholecystitis s/p lap cholecystectomy 03/05/2017 03/04/2017  . Anemia, iron deficiency   . Anxiety   . Arthritis   . Atrial fibrillation with rapid ventricular response ([place])   . BPH (benign prostatic hypertrophy)   . CAD (coronary artery disease)    Nonobstructive CAD per cath  .  Cardiac pacemaker in situ   . CHF (congestive heart failure) ([place])   . Chronic ulcer of right foot ([place])   . CKD (chronic kidney disease) stage 4, GFR 15-29 ml/min ([facility]) 09/26/2017  . CKD (chronic kidney disease), stage III ([place]) secondary to DM and HTN   nephrologist-  [name]  . Coronary artery disease involving native coronary artery of native heart without angina pectoris   . Diastolic dysfunction   . Dyspnea   . 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 History of cellulitis    right great toe 10-25-2014  . History of skin cancer   . HOCM (hypertrophic obstructive cardiomyopathy) ([place])   . Hyperlipidemia associated with type 2 diabetes mellitus ([place]) 10/25/2013  . Hypertension   . Hypertension associated with diabetes ([place]) 10/25/2013  . 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 Insulin dependent type 2 diabetes mellitus ([place]) 1991   followd by [name]--  has insulin pump  . Insulin pump in place   . OSA on CPAP   . 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 Peripheral neuropathy    severe  . Peripheral vascular disease ([place])    bilateral lower extremities  . PVD (peripheral vascular disease) ([place])   . Rib fracture 07/24/2015  . Seasonal and perennial allergic rhinitis 10/25/2013  . Seborrheic keratosis 09/19/2016  . Secondary hyperparathyroidism of renal origin ([place])   . 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 Sinus node dysfunction ([facility])   . Sleep apnea   . Syncope 07/24/2015  Past Surgical History:  Procedure Laterality Date  . AMPUTATION OF REPLICATED TOES  Mar 0093   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)  . AMPUTATION TOE Right 03/12/2015   Procedure: RIGHT HALLUS AMPUTATION ;  Surgeon: [name], MD;  Location: [facility];  Service: Podiatry;  Laterality: Right;  . CARDIAC CATHETERIZATION  11-25-2010   [place], [place]   Nonobstructive CAD  . CARDIAC PACEMAKER PLACEMENT  Nov 2009   Medtronic  .  CHOLECYSTECTOMY N/A 03/05/2017   Procedure: LAPAROSCOPIC CHOLECYSTECTOMY WITH INTRAOPERATIVE CHOLANGIOGRAM;  Surgeon: [name], MD;  Location: [facility];  Service: General;  Laterality: N/A;  . EP IMPLANTABLE DEVICE N/A 06/03/2015   Procedure: PPM Generator Changeout;  Surgeon: [name], MD;  Location: [facility];  Service: Cardiovascular;  Laterality: N/A;  . EXCISION BONE CYST Right 03/06/2015   Procedure: BONE BIOPSIES OF RIGHT FOOT;  Surgeon: [name], MD;  Location: [facility];  Service: Podiatry;  Laterality: Right;  . ORIF ANKLE FRACTURE Left 11/06/2014   Procedure: OPEN REDUCTION INTERNAL FIXATION (ORIF) LEFT  ANKLE FRACTURE;  Surgeon: [name], MD;  Location: [facility];  Service: Orthopedics;  Laterality: Left;  . TEE WITHOUT CARDIOVERSION N/A 09/01/2017   Procedure: TRANSESOPHAGEAL ECHOCARDIOGRAM (TEE);  Surgeon: [name], MD;  Location: [facility];  Service: Cardiovascular;  Laterality: N/A;  . TOTAL KNEE ARTHROPLASTY    . VEIN LIGATION AND STRIPPING      Family History  Problem Relation Age of Onset  . Cancer Mother        breast  . Heart attack Father   . Stroke Father     Social History:  reports that he quit smoking about 34 years ago. He has a 60.00 pack-year smoking history. He has never used smokeless tobacco. He reports current alcohol use of about 1.0 standard drinks of alcohol per week. He reports that he does not use drugs.    Review of Systems         Lipids: Has been on and off Lipitor since late 2014. Also had taken Gembrozil for several years without apparent side effects.  LDL is below 70 Previously had high ALT with Lipitor  HDL is consistently low  He is taking  fish oil OTC and triglycerides are below 200   Lab Results  Component Value Date   CHOL 122 07/17/2018   CHOL 123 12/27/2017   CHOL 165 08/31/2017   Lab Results  Component Value Date   HDL 25.80 (L) 07/17/2018   HDL 23.60 (L)  12/27/2017   HDL 20 (L) 08/31/2017   Lab Results  Component Value Date   LDLCALC 62 07/17/2018   LDLCALC 106 (H) 08/31/2017   LDLCALC 94 02/17/2017   Lab Results  Component Value Date   TRIG 168.0 (H) 07/17/2018   TRIG 281.0 (H) 12/27/2017   TRIG 195 (H) 08/31/2017   Lab Results  Component Value Date   CHOLHDL 5 07/17/2018   CHOLHDL 5 12/27/2017   CHOLHDL 8.3 08/31/2017   Lab Results  Component Value Date   LDLDIRECT 50.0 12/27/2017   LDLDIRECT 76.0 07/21/2017   LDLDIRECT 89.0 05/23/2017    Lab Results  Component Value Date   ALT 28 07/17/2018       HYPERTENSION: The blood pressure has been treated with various drugs including carvedilol 25 mg by his other physicians.    BP Readings from Last 3 Encounters:  01/02/19 (!) 148/68  11/19/18 138/84  11/11/18 Marland Kitchen)  148/60     Chronic kidney disease: Not from diabetes, is followed by nephrologist  Creatinine ranges:  Lab Results  Component Value Date   CREATININE 1.76 (H) 12/28/2018   CREATININE 1.96 (H) 10/23/2018   CREATININE 2.07 (H) 07/17/2018         Has history of Numbness in his feet  since about 2013  He has been wearing diabetic shoes.  He has been followed by podiatrist He is  under the care of a podiatrist   Vitamin B12 deficiency: Well managed with monthly injections  Lab Results  Component Value Date   [id number] 07/17/2018       Physical Examination:  BP (!) 148/68 (BP Location: Left Arm, Patient Position: Sitting, Cuff Size: Large)   Pulse 79   Ht 6\' 4"  (1.93 m)   Wt 290 lb 9.6 oz (131.8 kg)   SpO2 98%   BMI 35.37 kg/m     ASSESSMENT:  Diabetes type 2, uncontrolled with obesity See history of present illness for detailed discussion of current diabetes management, blood sugar patterns and problems identified   He is on the U-500 insulin with the Medtronic pump and using the Dexcom sensor  Last A1c 9%  He is still not having adequate control Because of variability in  his blood sugars no consistent patterns are seen except postprandial hyperglycemia because of not bolusing ahead of time before eating and reportedly missing boluses as discussed above Although he is concerned about hypoglycemia he has had only rare low normal blood sugars documented and infrequent blood sugars in the upper 60s only Currently not doing much exercise or making attempt to lose weight otherwise  PLAN:   No change in settings Discussed importance of bolusing ahead of time before eating Showed him his blood sugar patterns and discussed need for better control He will call if he has any unusual high or low blood sugars A1c on the next visit in 2 months   There are no Patient Instructions on file for this visit.    Elayne Snare 01/02/2019, 2:20 PM   Note: This office note was prepared with Dragon voice recognition system technology. Any transcriptional errors that result from this process are unintentional.

## 2019-01-02 NOTE — Patient Instructions (Signed)
MUST BOLUS BEFORE EACH MEAL

## 2019-01-02 NOTE — Telephone Encounter (Signed)
I called and left patients wife a message about appointment tomorrow. Thursday afternoons for Dr. Caryl Comes are virtual visits only. Does patient want to do telehealth visit tomorrow? Or reschedule to when Dr. Caryl Comes is in office?

## 2019-01-03 ENCOUNTER — Other Ambulatory Visit: Payer: Self-pay

## 2019-01-03 ENCOUNTER — Encounter: Payer: Self-pay | Admitting: Internal Medicine

## 2019-01-03 ENCOUNTER — Telehealth (INDEPENDENT_AMBULATORY_CARE_PROVIDER_SITE_OTHER): Payer: Medicare Other | Admitting: Internal Medicine

## 2019-01-03 VITALS — BP 148/68 | HR 79 | Wt 290.0 lb

## 2019-01-03 DIAGNOSIS — I421 Obstructive hypertrophic cardiomyopathy: Secondary | ICD-10-CM

## 2019-01-03 DIAGNOSIS — G4730 Sleep apnea, unspecified: Secondary | ICD-10-CM | POA: Diagnosis not present

## 2019-01-03 DIAGNOSIS — I639 Cerebral infarction, unspecified: Secondary | ICD-10-CM | POA: Diagnosis not present

## 2019-01-03 MED ORDER — TORSEMIDE 20 MG PO TABS: ORAL_TABLET | ORAL | 3 refills | Status: DC

## 2019-01-03 NOTE — Patient Instructions (Addendum)
Your physician has recommended you make the following change in your medication: Increase your Torsemide to 40mg  every other day.. will send in a new RX to your Pharmacy.   You have been referred to Dr. Fransico Him for review of your CPAP... a scheduler will call to set that appointment up for you.   Your physician wants you to follow-up in: 6 months with Dr. Caryl Comes. You will receive a reminder letter in the mail two months in advance. If you don't receive a letter, please call our office to schedule the follow-up appointment. [phone].   Keep your appt for your remote device check: 03/04/2019.

## 2019-01-03 NOTE — Progress Notes (Signed)
Electrophysiology TeleHealth Note   Due to national recommendations of social distancing due to COVID 19, an audio/video telehealth visit is felt to be most appropriate for this patient at this time.  See MyChart message from today for the patient's consent to telehealth for [facility].   Date:  01/03/2019   ID:  David [name], DOB 07-13-1940, MRN [id number]  Location: patient's home  Provider location: [address] [name]  Evaluation Performed: Follow-up visit  PCP:  [name], MD  Cardiologist:   \ Electrophysiologist:  SK   Chief Complaint:  dypsnea   History of Present Illness:    David [name] is a 78 y.o. male who presents via audio/video conferencing for a telehealth visit today.  Since last being seen in our clinic for sinus node dysfunction, hypertrophic cardiomyopathy and pacing, the patient reports  More shortness of breath some peripheral edema decrease sleepiness.  He has modest renal insufficiency.  He takes low-dose diuretics.  Denies accompanying chest pain.  Is his impression that his CPAP is not all that effective.  It has been years since it was prescribed and he is not currently being followed by a sleep physician.  He has an appointment with renal next Monday    The patient denies symptoms of fevers, chills, cough, or new SOB worrisome for COVID 19.    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[place])    CKD (chronic kidney disease) stage 4, GFR 15-29 ml/min ([place]) 09/26/2017   CKD (chronic kidney disease), stage III (HCC) secondary  to DM and HTN   nephrologist-  [name]   Coronary artery disease involving native coronary artery of native heart without angina pectoris    Diastolic dysfunction    Dyspnea    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 ([place]) 09/04/2017   History of cellulitis    right great toe 10-25-2014   History of skin cancer    HOCM (hypertrophic obstructive cardiomyopathy) ([place])    Hyperlipidemia associated with type 2 diabetes mellitus ([place]) 10/25/2013   Hypertension    Hypertension associated with diabetes ([place]) 10/25/2013   IDDM (insulin dependent diabetes mellitus) - on insulin pump 03/04/2017   Insulin dependent type 2 diabetes mellitus ([place]) 1991   followd by [name]--  has insulin pump   Insulin pump in place    OSA on CPAP    Pacemaker 06/03/2015   Peripheral neuropathy    severe   Peripheral vascular disease ([place])    bilateral lower extremities   PVD (peripheral vascular disease) ([place])    Rib fracture 07/24/2015   Seasonal and perennial allergic rhinitis 10/25/2013   Seborrheic keratosis 09/19/2016   Secondary hyperparathyroidism of renal origin ([place])    Sinus node arrhythmia 06/03/2015   Sinus node dysfunction ([place])    Sleep apnea    Syncope 07/24/2015    Past Surgical History:  Procedure Laterality Date   AMPUTATION OF REPLICATED TOES  Mar 7322   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)   AMPUTATION TOE Right  03/12/2015   Procedure: RIGHT HALLUS AMPUTATION ;  Surgeon: [name], MD;  Location: [facility];  Service: Podiatry;  Laterality: Right;   CARDIAC CATHETERIZATION  11-25-2010   [place], [place]   Nonobstructive CAD   CARDIAC PACEMAKER PLACEMENT  Nov 2009   Medtronic   CHOLECYSTECTOMY N/A 03/05/2017   Procedure: LAPAROSCOPIC CHOLECYSTECTOMY WITH INTRAOPERATIVE CHOLANGIOGRAM;  Surgeon: [name], MD;  Location: [facility];  Service: General;  Laterality: N/A;   EP IMPLANTABLE DEVICE N/A 06/03/2015   Procedure:  PPM Generator Changeout;  Surgeon: [name], MD;  Location: [facility];  Service: Cardiovascular;  Laterality: N/A;   EXCISION BONE CYST Right 03/06/2015   Procedure: BONE BIOPSIES OF RIGHT FOOT;  Surgeon: [name], MD;  Location: [facility];  Service: Podiatry;  Laterality: Right;   ORIF ANKLE FRACTURE Left 11/06/2014   Procedure: OPEN REDUCTION INTERNAL FIXATION (ORIF) LEFT  ANKLE FRACTURE;  Surgeon: [name], MD;  Location: [facility];  Service: Orthopedics;  Laterality: Left;   TEE WITHOUT CARDIOVERSION N/A 09/01/2017   Procedure: TRANSESOPHAGEAL ECHOCARDIOGRAM (TEE);  Surgeon: [name], MD;  Location: [facility];  Service: Cardiovascular;  Laterality: N/A;   TOTAL KNEE ARTHROPLASTY     VEIN LIGATION AND STRIPPING      Current Outpatient Medications  Medication Sig Dispense Refill   acetaminophen (TYLENOL) 325 MG tablet Take 1-2 tablets (325-650 mg total) by mouth every 4 (four) hours as needed for mild pain.     atorvastatin (LIPITOR) 10 MG tablet TAKE 1 TABLET BY MOUTH ONCE DAILY AT  6  PM 90 tablet 0   calcitRIOL (ROCALTROL) 0.25 MCG capsule Take 1 capsule by mouth once daily 90 capsule 0   carvedilol (COREG) 25 MG tablet TAKE 1 TABLET BY MOUTH TWICE DAILY WITH A MEAL 180 tablet 0   cholecalciferol (VITAMIN D) 1000 units tablet Take 1 tablet (1,000 Units total) by mouth daily. 30 tablet 1   clopidogrel (PLAVIX) 75 MG tablet Take 1 tablet by mouth once daily 90 tablet 0   diclofenac sodium (VOLTAREN) 1 % GEL Apply 2 g topically 4 (four) times daily. 150 g 0   hydrALAZINE (APRESOLINE) 100 MG tablet Take 100 mg by mouth 3 (three) times daily. Take 1 tablet by mouth three times daily.     insulin regular human CONCENTRATED (HUMULIN R) 500 UNIT/ML injection USE 0.25 ML DAILY OR AS DIRECTED IN INSULIN PUMP. DX:E11.65 20 mL 2   isosorbide mononitrate (IMDUR) 30 MG 24 hr tablet TAKE TWO TABLETS BY MOUTH ONCE DAILY 60 tablet 0     liraglutide (VICTOZA) 18 MG/3ML SOPN Inject 0.3 mLs (1.8 mg total) into the skin daily before supper. (Patient taking differently: Inject 1.8 mg into the skin daily before supper. 0.73mLs per wife) 9 pen 1   pantoprazole (PROTONIX) 40 MG tablet TAKE ONE TABLET BY MOUTH ONCE DAILY 90 tablet 1   sertraline (ZOLOFT) 100 MG tablet Take 100 mg by mouth daily.     torsemide (DEMADEX) 10 MG tablet Take 1 tablet by mouth twice daily 180 tablet 0   vitamin B-12 (CYANOCOBALAMIN) 100 MCG tablet Take 100 mcg by mouth daily.     No current facility-administered medications for this visit.     Allergies:   Adhesive [tape]   Social History:  The patient  reports that he quit smoking about 34 years ago. He has a 60.00 pack-year smoking history. He has never used smokeless tobacco. He reports current  alcohol use of about 1.0 standard drinks of alcohol per week. He reports that he does not use drugs.   Family History:  The patient's   family history includes Cancer in his mother; Heart attack in his father; Stroke in his father.   ROS:  Please see the history of present illness.   All other systems are personally reviewed and negative.    Exam:    Vital Signs:  BP (!) 148/68 (BP Location: Right Arm, Patient Position: Sitting, Cuff Size: Normal)    Pulse 79    Wt 290 lb (131.5 kg)    BMI 35.30 kg/m     Well appearing, alert and conversant, regular work of breathing,  good skin color Eyes- anicteric, neuro- grossly intact, skin- no apparent rash or lesions or cyanosis, mouth- oral mucosa is pink   Labs/Other Tests and Data Reviewed:    Recent Labs: 05/24/2018: Hemoglobin 12.1; Platelets 189 07/17/2018: ALT 28; TSH 2.38 12/28/2018: BUN 35; Creatinine, Ser 1.76; Potassium 4.5; Sodium 141   Wt Readings from Last 3 Encounters:  01/03/19 290 lb (131.5 kg)  01/02/19 290 lb 9.6 oz (131.8 kg)  11/19/18 293 lb (132.9 kg)     Other studies personally reviewed: Additional studies/ records that were  reviewed today include: As above    *  Last device remote is reviewed from [facility] PDF dated 7/20  which reveals normal device function,   arrhythmias - none     ASSESSMENT & PLAN:    Sinus node dysfunction  Pacemaker  Medtronic   Renal dysfunction  Congestive Heart Failure  Hypertension  Sleep apnea-treated  HCM   Blood pressure remains mildly elevated.  Will defer its management to Dr. Marval Regal  He is to see him on Monday.  In the interim, given his volume overload, will increase his torsemide from 10 twice daily--40 every other day i.e. Friday and Sunday.  Will defer long-term management to Dr.JC  His CPAP has not been investigated for a long time.  We will set him up as a consultation with Dr. Radford Pax.  She sees his wife.  Device function is normal  We spent more than 50% of our >25 min visit in face to face counseling regarding the above  COVID 19 screen The patient denies symptoms of COVID 19 at this time.  The importance of social distancing was discussed today.  Follow-up:  56m Next remote: As Scheduled   Current medicines are reviewed at length with the patient today.   The patient does not have concerns regarding his medicines.  The following changes were made today:  none  Labs/ tests ordered today include:   No orders of the defined types were placed in this encounter.   Future tests ( post COVID )     Patient Risk:  after full review of this patients clinical status, I feel that they are at moderate risk at this time.  Today, I have spent 16  minutes with the patient with telehealth technology discussing the above.  Signed, Virl Axe, MD  01/03/2019 2:59 PM     [place] [address] [place] [address] [phone] (office) [phone] (fax)

## 2019-01-05 ENCOUNTER — Other Ambulatory Visit: Payer: Self-pay

## 2019-01-05 ENCOUNTER — Emergency Department (HOSPITAL_COMMUNITY): Payer: Medicare Other

## 2019-01-05 ENCOUNTER — Encounter (HOSPITAL_COMMUNITY): Payer: Self-pay

## 2019-01-05 ENCOUNTER — Inpatient Hospital Stay (HOSPITAL_COMMUNITY)
Admission: EM | Admit: 2019-01-05 | Discharge: 2019-01-07 | DRG: 078 | Disposition: A | Discharge status: Home Health | Payer: Medicare Other | Attending: Family Medicine | Admitting: Family Medicine

## 2019-01-05 DIAGNOSIS — I4891 Unspecified atrial fibrillation: Secondary | ICD-10-CM | POA: Diagnosis present

## 2019-01-05 DIAGNOSIS — I739 Peripheral vascular disease, unspecified: Secondary | ICD-10-CM | POA: Diagnosis present

## 2019-01-05 DIAGNOSIS — I69392 Facial weakness following cerebral infarction: Secondary | ICD-10-CM

## 2019-01-05 DIAGNOSIS — R4182 Altered mental status, unspecified: Secondary | ICD-10-CM

## 2019-01-05 DIAGNOSIS — I130 Hypertensive heart and chronic kidney disease with heart failure and stage 1 through stage 4 chronic kidney disease, or unspecified chronic kidney disease: Secondary | ICD-10-CM | POA: Diagnosis present

## 2019-01-05 DIAGNOSIS — Z9861 Coronary angioplasty status: Secondary | ICD-10-CM

## 2019-01-05 DIAGNOSIS — E785 Hyperlipidemia, unspecified: Secondary | ICD-10-CM | POA: Diagnosis present

## 2019-01-05 DIAGNOSIS — I5032 Chronic diastolic (congestive) heart failure: Secondary | ICD-10-CM | POA: Diagnosis present

## 2019-01-05 DIAGNOSIS — IMO0001 Reserved for inherently not codable concepts without codable children: Secondary | ICD-10-CM

## 2019-01-05 DIAGNOSIS — E1169 Type 2 diabetes mellitus with other specified complication: Secondary | ICD-10-CM | POA: Diagnosis not present

## 2019-01-05 DIAGNOSIS — R29810 Facial weakness: Secondary | ICD-10-CM | POA: Diagnosis not present

## 2019-01-05 DIAGNOSIS — E1122 Type 2 diabetes mellitus with diabetic chronic kidney disease: Secondary | ICD-10-CM | POA: Diagnosis present

## 2019-01-05 DIAGNOSIS — E1165 Type 2 diabetes mellitus with hyperglycemia: Secondary | ICD-10-CM | POA: Diagnosis present

## 2019-01-05 DIAGNOSIS — I10 Essential (primary) hypertension: Secondary | ICD-10-CM | POA: Diagnosis not present

## 2019-01-05 DIAGNOSIS — Z955 Presence of coronary angioplasty implant and graft: Secondary | ICD-10-CM

## 2019-01-05 DIAGNOSIS — N184 Chronic kidney disease, stage 4 (severe): Secondary | ICD-10-CM | POA: Diagnosis not present

## 2019-01-05 DIAGNOSIS — E538 Deficiency of other specified B group vitamins: Secondary | ICD-10-CM | POA: Diagnosis present

## 2019-01-05 DIAGNOSIS — I639 Cerebral infarction, unspecified: Secondary | ICD-10-CM | POA: Diagnosis present

## 2019-01-05 DIAGNOSIS — R4701 Aphasia: Secondary | ICD-10-CM | POA: Diagnosis not present

## 2019-01-05 DIAGNOSIS — I69311 Memory deficit following cerebral infarction: Secondary | ICD-10-CM | POA: Diagnosis not present

## 2019-01-05 DIAGNOSIS — Z89411 Acquired absence of right great toe: Secondary | ICD-10-CM

## 2019-01-05 DIAGNOSIS — Z794 Long term (current) use of insulin: Secondary | ICD-10-CM

## 2019-01-05 DIAGNOSIS — R404 Transient alteration of awareness: Secondary | ICD-10-CM | POA: Diagnosis not present

## 2019-01-05 DIAGNOSIS — E1142 Type 2 diabetes mellitus with diabetic polyneuropathy: Secondary | ICD-10-CM | POA: Diagnosis present

## 2019-01-05 DIAGNOSIS — E119 Type 2 diabetes mellitus without complications: Secondary | ICD-10-CM

## 2019-01-05 DIAGNOSIS — I674 Hypertensive encephalopathy: Secondary | ICD-10-CM

## 2019-01-05 DIAGNOSIS — I421 Obstructive hypertrophic cardiomyopathy: Secondary | ICD-10-CM | POA: Diagnosis present

## 2019-01-05 DIAGNOSIS — I161 Hypertensive emergency: Secondary | ICD-10-CM | POA: Diagnosis present

## 2019-01-05 DIAGNOSIS — D631 Anemia in chronic kidney disease: Secondary | ICD-10-CM | POA: Diagnosis present

## 2019-01-05 DIAGNOSIS — Z79899 Other long term (current) drug therapy: Secondary | ICD-10-CM

## 2019-01-05 DIAGNOSIS — Z9641 Presence of insulin pump (external) (internal): Secondary | ICD-10-CM | POA: Diagnosis present

## 2019-01-05 DIAGNOSIS — I69354 Hemiplegia and hemiparesis following cerebral infarction affecting left non-dominant side: Secondary | ICD-10-CM

## 2019-01-05 DIAGNOSIS — R410 Disorientation, unspecified: Secondary | ICD-10-CM | POA: Diagnosis not present

## 2019-01-05 DIAGNOSIS — T85694A Other mechanical complication of insulin pump, initial encounter: Secondary | ICD-10-CM | POA: Diagnosis not present

## 2019-01-05 DIAGNOSIS — Z9049 Acquired absence of other specified parts of digestive tract: Secondary | ICD-10-CM

## 2019-01-05 DIAGNOSIS — Z87891 Personal history of nicotine dependence: Secondary | ICD-10-CM

## 2019-01-05 DIAGNOSIS — Z20828 Contact with and (suspected) exposure to other viral communicable diseases: Secondary | ICD-10-CM | POA: Diagnosis not present

## 2019-01-05 DIAGNOSIS — Z8249 Family history of ischemic heart disease and other diseases of the circulatory system: Secondary | ICD-10-CM

## 2019-01-05 DIAGNOSIS — Z91048 Other nonmedicinal substance allergy status: Secondary | ICD-10-CM

## 2019-01-05 DIAGNOSIS — N2581 Secondary hyperparathyroidism of renal origin: Secondary | ICD-10-CM | POA: Diagnosis present

## 2019-01-05 DIAGNOSIS — I5030 Unspecified diastolic (congestive) heart failure: Secondary | ICD-10-CM | POA: Diagnosis not present

## 2019-01-05 DIAGNOSIS — Z9989 Dependence on other enabling machines and devices: Secondary | ICD-10-CM

## 2019-01-05 DIAGNOSIS — N400 Enlarged prostate without lower urinary tract symptoms: Secondary | ICD-10-CM | POA: Diagnosis present

## 2019-01-05 DIAGNOSIS — E1151 Type 2 diabetes mellitus with diabetic peripheral angiopathy without gangrene: Secondary | ICD-10-CM | POA: Diagnosis present

## 2019-01-05 DIAGNOSIS — G4733 Obstructive sleep apnea (adult) (pediatric): Secondary | ICD-10-CM | POA: Diagnosis present

## 2019-01-05 DIAGNOSIS — Z950 Presence of cardiac pacemaker: Secondary | ICD-10-CM

## 2019-01-05 DIAGNOSIS — Z888 Allergy status to other drugs, medicaments and biological substances status: Secondary | ICD-10-CM

## 2019-01-05 DIAGNOSIS — M1990 Unspecified osteoarthritis, unspecified site: Secondary | ICD-10-CM | POA: Diagnosis present

## 2019-01-05 DIAGNOSIS — Z7902 Long term (current) use of antithrombotics/antiplatelets: Secondary | ICD-10-CM

## 2019-01-05 DIAGNOSIS — Z85828 Personal history of other malignant neoplasm of skin: Secondary | ICD-10-CM

## 2019-01-05 DIAGNOSIS — Z96659 Presence of unspecified artificial knee joint: Secondary | ICD-10-CM | POA: Diagnosis present

## 2019-01-05 DIAGNOSIS — Z803 Family history of malignant neoplasm of breast: Secondary | ICD-10-CM

## 2019-01-05 DIAGNOSIS — I2510 Atherosclerotic heart disease of native coronary artery without angina pectoris: Secondary | ICD-10-CM | POA: Diagnosis not present

## 2019-01-05 DIAGNOSIS — G9340 Encephalopathy, unspecified: Secondary | ICD-10-CM | POA: Diagnosis not present

## 2019-01-05 DIAGNOSIS — Z8673 Personal history of transient ischemic attack (TIA), and cerebral infarction without residual deficits: Secondary | ICD-10-CM

## 2019-01-05 DIAGNOSIS — Z823 Family history of stroke: Secondary | ICD-10-CM

## 2019-01-05 DIAGNOSIS — N186 End stage renal disease: Secondary | ICD-10-CM | POA: Diagnosis present

## 2019-01-05 DIAGNOSIS — I152 Hypertension secondary to endocrine disorders: Secondary | ICD-10-CM | POA: Diagnosis present

## 2019-01-05 DIAGNOSIS — I495 Sick sinus syndrome: Secondary | ICD-10-CM

## 2019-01-05 DIAGNOSIS — E1159 Type 2 diabetes mellitus with other circulatory complications: Secondary | ICD-10-CM | POA: Diagnosis present

## 2019-01-05 DIAGNOSIS — Z89421 Acquired absence of other right toe(s): Secondary | ICD-10-CM

## 2019-01-05 DIAGNOSIS — R609 Edema, unspecified: Secondary | ICD-10-CM | POA: Diagnosis not present

## 2019-01-05 LAB — DIFFERENTIAL
Abs Immature Granulocytes: 0.07 10*3/uL (ref 0.00–0.07)
Basophils Absolute: 0 10*3/uL (ref 0.0–0.1)
Basophils Relative: 0 %
Eosinophils Absolute: 0.2 10*3/uL (ref 0.0–0.5)
Eosinophils Relative: 2 %
Immature Granulocytes: 1 %
Lymphocytes Relative: 12 %
Lymphs Abs: 1.3 10*3/uL (ref 0.7–4.0)
Monocytes Absolute: 1.2 10*3/uL — ABNORMAL HIGH (ref 0.1–1.0)
Monocytes Relative: 11 %
Neutro Abs: 8.3 10*3/uL — ABNORMAL HIGH (ref 1.7–7.7)
Neutrophils Relative %: 74 %

## 2019-01-05 LAB — RAPID URINE DRUG SCREEN, HOSP PERFORMED
Amphetamines: NOT DETECTED
Barbiturates: NOT DETECTED
Benzodiazepines: NOT DETECTED
Cocaine: NOT DETECTED
Opiates: NOT DETECTED
Tetrahydrocannabinol: NOT DETECTED

## 2019-01-05 LAB — COMPREHENSIVE METABOLIC PANEL
ALT: 29 U/L (ref 0–44)
AST: 27 U/L (ref 15–41)
Albumin: 3.7 g/dL (ref 3.5–5.0)
Alkaline Phosphatase: 85 U/L (ref 38–126)
Anion gap: 10 (ref 5–15)
BUN: 43 mg/dL — ABNORMAL HIGH (ref 8–23)
CO2: 22 mmol/L (ref 22–32)
Calcium: 9.3 mg/dL (ref 8.9–10.3)
Chloride: 107 mmol/L (ref 98–111)
Creatinine, Ser: 1.85 mg/dL — ABNORMAL HIGH (ref 0.61–1.24)
GFR calc Af Amer: 40 mL/min — ABNORMAL LOW (ref >60)
GFR calc non Af Amer: 34 mL/min — ABNORMAL LOW (ref >60)
Glucose, Bld: 189 mg/dL — ABNORMAL HIGH (ref 70–99)
Potassium: 4.4 mmol/L (ref 3.5–5.1)
Sodium: 139 mmol/L (ref 135–145)
Total Bilirubin: 0.7 mg/dL (ref 0.3–1.2)
Total Protein: 7 g/dL (ref 6.5–8.1)

## 2019-01-05 LAB — URINALYSIS, ROUTINE W REFLEX MICROSCOPIC
Bilirubin Urine: NEGATIVE
Glucose, UA: NEGATIVE mg/dL
Ketones, ur: NEGATIVE mg/dL
Leukocytes,Ua: NEGATIVE
Nitrite: NEGATIVE
Protein, ur: 30 mg/dL — AB
Specific Gravity, Urine: 1.008 (ref 1.005–1.030)
pH: 5 (ref 5.0–8.0)

## 2019-01-05 LAB — CBC
HCT: 37 % — ABNORMAL LOW (ref 39.0–52.0)
Hemoglobin: 11.6 g/dL — ABNORMAL LOW (ref 13.0–17.0)
MCH: 29.6 pg (ref 26.0–34.0)
MCHC: 31.4 g/dL (ref 30.0–36.0)
MCV: 94.4 fL (ref 80.0–100.0)
Platelets: 224 10*3/uL (ref 150–400)
RBC: 3.92 MIL/uL — ABNORMAL LOW (ref 4.22–5.81)
RDW: 15.9 % — ABNORMAL HIGH (ref 11.5–15.5)
WBC: 11.2 10*3/uL — ABNORMAL HIGH (ref 4.0–10.5)
nRBC: 0 % (ref 0.0–0.2)

## 2019-01-05 LAB — PROTIME-INR
INR: 1 (ref 0.8–1.2)
Prothrombin Time: 13.5 seconds (ref 11.4–15.2)

## 2019-01-05 LAB — CBG MONITORING, ED: Glucose-Capillary: 192 mg/dL — ABNORMAL HIGH (ref 70–99)

## 2019-01-05 LAB — BRAIN NATRIURETIC PEPTIDE: B Natriuretic Peptide: 429.9 pg/mL — ABNORMAL HIGH (ref 0.0–100.0)

## 2019-01-05 LAB — ETHANOL: Alcohol, Ethyl (B): <10 mg/dL (ref <10)

## 2019-01-05 LAB — APTT: aPTT: 32 seconds (ref 24–36)

## 2019-01-05 LAB — GLUCOSE, CAPILLARY: Glucose-Capillary: 141 mg/dL — ABNORMAL HIGH (ref 70–99)

## 2019-01-05 MED ORDER — HYDRALAZINE HCL 20 MG/ML IJ SOLN
20.0000 mg | Freq: Once | INTRAMUSCULAR | Status: DC
  Filled 2019-01-05: qty 1

## 2019-01-05 MED ORDER — VITAMIN B-12 100 MCG PO TABS
100.0000 ug | ORAL_TABLET | Freq: Every day | ORAL | Status: DC
  Administered 2019-01-06 – 2019-01-07 (×2): 100 ug via ORAL
  Filled 2019-01-05 (×2): qty 1

## 2019-01-05 MED ORDER — VITAMIN D 25 MCG (1000 UNIT) PO TABS
1000.0000 [IU] | ORAL_TABLET | Freq: Every day | ORAL | Status: DC
  Administered 2019-01-06 – 2019-01-07 (×2): 1000 [IU] via ORAL
  Filled 2019-01-05 (×2): qty 1

## 2019-01-05 MED ORDER — INSULIN ASPART 100 UNIT/ML ~~LOC~~ SOLN: 0.0000 [IU] | SUBCUTANEOUS | Status: DC

## 2019-01-05 MED ORDER — HALOPERIDOL LACTATE 5 MG/ML IJ SOLN
5.0000 mg | Freq: Once | INTRAMUSCULAR | Status: AC
  Administered 2019-01-05: 5 mg via INTRAVENOUS
  Filled 2019-01-05: qty 1

## 2019-01-05 MED ORDER — ACETAMINOPHEN 650 MG RE SUPP: 650.0000 mg | RECTAL | Status: DC | PRN

## 2019-01-05 MED ORDER — CALCITRIOL 0.25 MCG PO CAPS
0.2500 ug | ORAL_CAPSULE | Freq: Every day | ORAL | Status: DC
  Administered 2019-01-06 – 2019-01-07 (×2): 0.25 ug via ORAL
  Filled 2019-01-05 (×2): qty 1

## 2019-01-05 MED ORDER — ACETAMINOPHEN 160 MG/5ML PO SOLN: 650.0000 mg | ORAL | Status: DC | PRN

## 2019-01-05 MED ORDER — SERTRALINE HCL 100 MG PO TABS
100.0000 mg | ORAL_TABLET | Freq: Every day | ORAL | Status: DC
  Administered 2019-01-06 – 2019-01-07 (×2): 100 mg via ORAL
  Filled 2019-01-05 (×2): qty 1

## 2019-01-05 MED ORDER — ACETAMINOPHEN 325 MG PO TABS: 650.0000 mg | ORAL_TABLET | ORAL | Status: DC | PRN

## 2019-01-05 MED ORDER — PANTOPRAZOLE SODIUM 40 MG PO TBEC
40.0000 mg | DELAYED_RELEASE_TABLET | Freq: Every day | ORAL | Status: DC
  Administered 2019-01-06 – 2019-01-07 (×2): 40 mg via ORAL
  Filled 2019-01-05 (×2): qty 1

## 2019-01-05 MED ORDER — CARVEDILOL 12.5 MG PO TABS
12.5000 mg | ORAL_TABLET | Freq: Two times a day (BID) | ORAL | Status: DC
  Administered 2019-01-06 – 2019-01-07 (×3): 12.5 mg via ORAL
  Filled 2019-01-05 (×3): qty 1

## 2019-01-05 MED ORDER — ACETAMINOPHEN 325 MG PO TABS: 325.0000 mg | ORAL_TABLET | ORAL | Status: DC | PRN

## 2019-01-05 MED ORDER — TORSEMIDE 20 MG PO TABS
20.0000 mg | ORAL_TABLET | Freq: Two times a day (BID) | ORAL | Status: DC
  Administered 2019-01-06 – 2019-01-07 (×3): 20 mg via ORAL
  Filled 2019-01-05 (×3): qty 1

## 2019-01-05 MED ORDER — INSULIN PUMP
SUBCUTANEOUS | Status: DC
  Administered 2019-01-06: 23 via SUBCUTANEOUS
  Administered 2019-01-06: 25 via SUBCUTANEOUS
  Administered 2019-01-06 (×2): 40 via SUBCUTANEOUS
  Filled 2019-01-05: qty 1

## 2019-01-05 MED ORDER — SENNOSIDES-DOCUSATE SODIUM 8.6-50 MG PO TABS: 1.0000 | ORAL_TABLET | Freq: Every evening | ORAL | Status: DC | PRN

## 2019-01-05 MED ORDER — FUROSEMIDE 10 MG/ML IJ SOLN
40.0000 mg | Freq: Once | INTRAMUSCULAR | Status: AC
  Administered 2019-01-05: 40 mg via INTRAVENOUS
  Filled 2019-01-05: qty 4

## 2019-01-05 MED ORDER — LORAZEPAM 2 MG/ML IJ SOLN: 1.0000 mg | Freq: Once | INTRAMUSCULAR | Status: DC

## 2019-01-05 MED ORDER — ENOXAPARIN SODIUM 40 MG/0.4ML ~~LOC~~ SOLN: 40.0000 mg | SUBCUTANEOUS | Status: DC

## 2019-01-05 MED ORDER — DICLOFENAC SODIUM 1 % TD GEL: 2.0000 g | Freq: Four times a day (QID) | TRANSDERMAL | Status: DC | PRN

## 2019-01-05 MED ORDER — CLOPIDOGREL BISULFATE 75 MG PO TABS
75.0000 mg | ORAL_TABLET | Freq: Every day | ORAL | Status: DC
  Administered 2019-01-06 – 2019-01-07 (×2): 75 mg via ORAL
  Filled 2019-01-05 (×2): qty 1

## 2019-01-05 MED ORDER — STROKE: EARLY STAGES OF RECOVERY BOOK
Freq: Once | Status: AC
  Administered 2019-01-05: 23:00:00
  Filled 2019-01-05: qty 1

## 2019-01-05 MED ORDER — ATORVASTATIN CALCIUM 10 MG PO TABS
10.0000 mg | ORAL_TABLET | Freq: Every day | ORAL | Status: DC
  Administered 2019-01-05 – 2019-01-06 (×2): 10 mg via ORAL
  Filled 2019-01-05 (×2): qty 1

## 2019-01-05 NOTE — ED Notes (Signed)
[facility] called to activate code stroke 

## 2019-01-05 NOTE — ED Notes (Signed)
ED TO INPATIENT HANDOFF REPORT  ED Nurse Name and Phone #: Almyra Free [phone]  S Name/Age/Gender David [name] 78 y.o. male Room/Bed: 041C/041C  Code Status   Code Status: Prior  Home/SNF/Other Home Patient oriented to: self, place and situation Is this baseline? No   Triage Complete: Triage complete  Chief Complaint AMS  Triage Note Pt arrived via EMS with a complaint of increased confusion and aphasia. Pt stated he has no pain and no complaints, the family was the one who called EMS. Edema and ascites were noted on inspection. Last known normal was 1030 pm the night before symptoms started, symptoms were first noted by the wife upon arousal from sleep. The pt has a history of stroke 1 year prior to this event.    Allergies Allergies  Allergen Reactions  . Ativan [Lorazepam] Anxiety    Pt gets more agitated  . Adhesive [Tape] Other (See Comments)    blisters    Level of Care/Admitting Diagnosis ED Disposition    ED Disposition Condition Comment   Admit  Hospital Area: [facility] [[id number]]  Level of Care: Telemetry Medical [104]  Covid Evaluation: Asymptomatic Screening Protocol (No Symptoms)  Diagnosis: Encephalopathy acute [665993]  Admitting Physician: Cresenciano Lick  Attending Physician: Sid Falcon [[id number]  PT Class (Do Not Modify): Observation [104]  PT Acc Code (Do Not Modify): Observation [10022]       B Medical/Surgery History Past Medical History:  Diagnosis Date  . Acute cholecystitis s/p lap cholecystectomy 03/05/2017 03/04/2017  . Anemia, iron deficiency   . Anxiety   . Arthritis   . Atrial fibrillation with rapid ventricular response ([place])   . BPH (benign prostatic hypertrophy)   . CAD (coronary artery disease)    Nonobstructive CAD per cath  . Cardiac pacemaker in situ   . CHF (congestive heart failure) ([place])   . Chronic ulcer of right foot ([place])   . CKD (chronic kidney disease) stage 4, GFR 15-29 ml/min (HCC)  09/26/2017  . CKD (chronic kidney disease), stage III ([place]) secondary to DM and HTN   nephrologist-  [name]  . Coronary artery disease involving native coronary artery of native heart without angina pectoris   . Diastolic dysfunction   . Dyspnea   . 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 History of cellulitis    right great toe 10-25-2014  . History of skin cancer   . HOCM (hypertrophic obstructive cardiomyopathy) ([place])   . Hyperlipidemia associated with type 2 diabetes mellitus ([place]) 10/25/2013  . Hypertension   . Hypertension associated with diabetes ([place]) 10/25/2013  . 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 Insulin pump in place   . OSA on CPAP   . 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 Peripheral neuropathy    severe  . Peripheral vascular disease ([place])    bilateral lower extremities  . PVD (peripheral vascular disease) ([place])   . Rib fracture 07/24/2015  . Seasonal and perennial allergic rhinitis 10/25/2013  . Seborrheic keratosis 09/19/2016  . Secondary hyperparathyroidism of renal origin ([place])   . 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 Sinus node dysfunction (HCC)   . Sleep apnea   . Syncope 07/24/2015   Past Surgical History:  Procedure Laterality Date  . AMPUTATION OF REPLICATED TOES  Mar 5701  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)  . AMPUTATION TOE Right 03/12/2015   Procedure: RIGHT HALLUS AMPUTATION ;  Surgeon: [name], MD;  Location: [facility];  Service: Podiatry;  Laterality: Right;  . CARDIAC CATHETERIZATION  11-25-2010   [place], [place]   Nonobstructive CAD  . CARDIAC PACEMAKER PLACEMENT  Nov 2009   [employer]  . CHOLECYSTECTOMY N/A 03/05/2017   Procedure: LAPAROSCOPIC CHOLECYSTECTOMY WITH INTRAOPERATIVE CHOLANGIOGRAM;  Surgeon: [name], MD;  Location: [facility];  Service: General;   Laterality: N/A;  . EP IMPLANTABLE DEVICE N/A 06/03/2015   Procedure: PPM Generator Changeout;  Surgeon: [name], MD;  Location: [facility];  Service: Cardiovascular;  Laterality: N/A;  . EXCISION BONE CYST Right 03/06/2015   Procedure: BONE BIOPSIES OF RIGHT FOOT;  Surgeon: [name], MD;  Location: [facility];  Service: Podiatry;  Laterality: Right;  . ORIF ANKLE FRACTURE Left 11/06/2014   Procedure: OPEN REDUCTION INTERNAL FIXATION (ORIF) LEFT  ANKLE FRACTURE;  Surgeon: [name], MD;  Location: [facility];  Service: Orthopedics;  Laterality: Left;  . TEE WITHOUT CARDIOVERSION N/A 09/01/2017   Procedure: TRANSESOPHAGEAL ECHOCARDIOGRAM (TEE);  Surgeon: [name], MD;  Location: [facility];  Service: Cardiovascular;  Laterality: N/A;  . TOTAL KNEE ARTHROPLASTY    . VEIN LIGATION AND STRIPPING       A IV Location/Drains/Wounds Patient Lines/Drains/Airways Status   Active Line/Drains/Airways    Name:   Placement date:   Placement time:   Site:   Days:   Peripheral IV 01/05/19 Right Antecubital   01/05/19    1435    Antecubital   less than 1   Incision (Closed) 03/05/17 Abdomen Other (Comment)   03/05/17    1223     671   Incision - 4 Ports Abdomen 1: Right;Lateral 2: Right;Medial 3: Superior 4: Umbilicus   16/10/96    0454     671          Intake/Output Last 24 hours No intake or output data in the 24 hours ending 01/05/19 2156  Labs/Imaging Results for orders placed or performed during the hospital encounter of 01/05/19 (from the past 48 hour(s))  Urine rapid drug screen (hosp performed)     Status: None   Collection Time: 01/05/19  2:16 PM  Result Value Ref Range   Opiates NONE DETECTED NONE DETECTED   Cocaine NONE DETECTED NONE DETECTED   Benzodiazepines NONE DETECTED NONE DETECTED   Amphetamines NONE DETECTED NONE DETECTED   Tetrahydrocannabinol NONE DETECTED NONE DETECTED   Barbiturates NONE DETECTED NONE DETECTED     Comment: (NOTE) DRUG SCREEN FOR MEDICAL PURPOSES ONLY.  IF CONFIRMATION IS NEEDED FOR ANY PURPOSE, NOTIFY LAB WITHIN 5 DAYS. LOWEST DETECTABLE LIMITS FOR URINE DRUG SCREEN Drug Class                     Cutoff (ng/mL) Amphetamine and metabolites    1000 Barbiturate and metabolites    200 Benzodiazepine                 098 Tricyclics and metabolites     300 Opiates and metabolites        300 Cocaine and metabolites        300 THC                            50 Performed at [facility], [place]  [address], [place], [place] [address]   Urinalysis, Routine w reflex microscopic     Status: Abnormal   Collection Time: 01/05/19  2:16 PM  Result Value Ref Range   Color, Urine STRAW (A) YELLOW   APPearance CLEAR CLEAR   Specific Gravity, Urine 1.008 1.005 - 1.030   pH 5.0 5.0 - 8.0   Glucose, UA NEGATIVE NEGATIVE mg/dL   Hgb urine dipstick SMALL (A) NEGATIVE   Bilirubin Urine NEGATIVE NEGATIVE   Ketones, ur NEGATIVE NEGATIVE mg/dL   Protein, ur 30 (A) NEGATIVE mg/dL   Nitrite NEGATIVE NEGATIVE   Leukocytes,Ua NEGATIVE NEGATIVE   RBC / HPF 0-5 0 - 5 RBC/hpf   Bacteria, UA RARE (A) NONE SEEN   Mucus PRESENT    Hyaline Casts, UA PRESENT     Comment: Performed at [facility] [address] [place], [place] [address]  Ethanol     Status: None   Collection Time: 01/05/19  2:34 PM  Result Value Ref Range   Alcohol, Ethyl (B) <10 <10 mg/dL    Comment: (NOTE) Lowest detectable limit for serum alcohol is 10 mg/dL. For medical purposes only. Performed at [facility], [place] [address], [place], [place] [address]   Protime-INR     Status: None   Collection Time: 01/05/19  2:34 PM  Result Value Ref Range   Prothrombin Time 13.5 11.4 - 15.2 seconds   INR 1.0 0.8 - 1.2    Comment: (NOTE) INR goal varies based on device and disease states. Performed at [facility], [place] [address], [place], [place] [address]   APTT     Status: None   Collection Time: 01/05/19   2:34 PM  Result Value Ref Range   aPTT 32 24 - 36 seconds    Comment: Performed at [facility] [address], [place], [name] [address]  CBC     Status: Abnormal   Collection Time: 01/05/19  2:34 PM  Result Value Ref Range   WBC 11.2 (H) 4.0 - 10.5 K/uL   RBC 3.92 (L) 4.22 - 5.81 MIL/uL   Hemoglobin 11.6 (L) 13.0 - 17.0 g/dL   HCT 37.0 (L) 39.0 - 52.0 %   MCV 94.4 80.0 - 100.0 fL   MCH 29.6 26.0 - 34.0 pg   MCHC 31.4 30.0 - 36.0 g/dL   RDW 15.9 (H) 11.5 - 15.5 %   Platelets 224 150 - 400 K/uL   nRBC 0.0 0.0 - 0.2 %    Comment: Performed at [facility] [address] [place], [place] [address]  Differential     Status: Abnormal   Collection Time: 01/05/19  2:34 PM  Result Value Ref Range   Neutrophils Relative % 74 %   Neutro Abs 8.3 (H) 1.7 - 7.7 K/uL   Lymphocytes Relative 12 %   Lymphs Abs 1.3 0.7 - 4.0 K/uL   Monocytes Relative 11 %   Monocytes Absolute 1.2 (H) 0.1 - 1.0 K/uL   Eosinophils Relative 2 %   Eosinophils Absolute 0.2 0.0 - 0.5 K/uL   Basophils Relative 0 %   Basophils Absolute 0.0 0.0 - 0.1 K/uL   Immature Granulocytes 1 %   Abs Immature Granulocytes 0.07 0.00 - 0.07 K/uL    Comment: Performed at [facility] [address], [place], [place] [address]  Comprehensive metabolic panel     Status: Abnormal   Collection Time: 01/05/19  2:34 PM  Result  Value Ref Range   Sodium 139 135 - 145 mmol/L   Potassium 4.4 3.5 - 5.1 mmol/L   Chloride 107 98 - 111 mmol/L   CO2 22 22 - 32 mmol/L   Glucose, Bld 189 (H) 70 - 99 mg/dL   BUN 43 (H) 8 - 23 mg/dL   Creatinine, Ser 1.85 (H) 0.61 - 1.24 mg/dL   Calcium 9.3 8.9 - 10.3 mg/dL   Total Protein 7.0 6.5 - 8.1 g/dL   Albumin 3.7 3.5 - 5.0 g/dL   AST 27 15 - 41 U/L   ALT 29 0 - 44 U/L   Alkaline Phosphatase 85 38 - 126 U/L   Total Bilirubin 0.7 0.3 - 1.2 mg/dL   GFR calc non Af Amer 34 (L) >60 mL/min   GFR calc Af Amer 40 (L) >60 mL/min   Anion gap 10 5 - 15    Comment: Performed at [facility], [address], [place], [place] [address]  Brain natriuretic peptide     Status: Abnormal   Collection Time: 01/05/19  2:34 PM  Result Value Ref Range   B Natriuretic Peptide 429.9 (H) 0.0 - 100.0 pg/mL    Comment: Performed at [facility] [address] [place], [place] [address]  POC CBG, ED     Status: Abnormal   Collection Time: 01/05/19  2:49 PM  Result Value Ref Range   Glucose-Capillary 192 (H) 70 - 99 mg/dL   Dg Chest Portable 1 View  Result Date: 01/05/2019 CLINICAL DATA:  Confusion.  Disorientation. EXAM: PORTABLE CHEST 1 VIEW COMPARISON:  09/02/2017 FINDINGS: 1647 hours. The lungs are clear without focal pneumonia, edema, pneumothorax or pleural effusion. The cardio pericardial silhouette is enlarged. Left permanent pacemaker again noted. The visualized bony structures of the thorax are intact. Telemetry leads overlie the chest. IMPRESSION: Cardiomegaly without acute cardiopulmonary findings. Electronically Signed   By: Misty Stanley [name]   On: 01/05/2019 17:04   Ct Head Code Stroke Wo Contrast  Result Date: 01/05/2019 CLINICAL DATA:  Code stroke.  Expressive aphasia EXAM: CT HEAD WITHOUT CONTRAST TECHNIQUE: Contiguous axial images were obtained from the base of the skull through the vertex without intravenous contrast. COMPARISON:  08/30/2017 FINDINGS: Brain: No evidence of acute infarction, hemorrhage, hydrocephalus, extra-axial collection or mass lesion/mass effect. Small vessel ischemic gliosis in the deep cerebral white matter. Mild for age volume loss. Small calcifications along the right cerebral convexity, unchanged. These could be vascular or subarachnoid/inflammatory. Vascular: Atherosclerotic calcification.  No hyperdense vessel. Skull: No acute or aggressive finding Sinuses/Orbits: Bilateral cataract resection Other: These results were communicated to Xu at 3:45 pmon 8/22/2020by text page via the [facility] messaging system. ASPECTS [facility] Stroke Program  Early CT Score) - Ganglionic level infarction (caudate, lentiform nuclei, internal capsule, insula, M1-M3 cortex): 7 - Supraganglionic infarction (M4-M6 cortex): 3 Total score (0-10 with 10 being normal): 10 IMPRESSION: 1. No acute finding.  ASPECTS is 10. 2. Atrophy and chronic small vessel ischemia. Electronically Signed   By: Monte Fantasia [name]   On: 01/05/2019 15:46    Pending Labs Unresulted Labs (From admission, onward)    Start     Ordered   01/05/19 1724  Novel Coronavirus, NAA (hospital order; send-out to ref lab)  (Symptomatic/High Risk/Tier 1)  Once,   STAT    Question Answer Comment  Is this test for diagnosis or screening Screening   Symptomatic for COVID-19 as defined by CDC No   Hospitalized for COVID-19  No   Admitted to ICU for COVID-19 No   Previously tested for COVID-19 No   Resident in a congregate (group) care setting No   Employed in healthcare setting No      01/05/19 1723   Signed and Held  Hemoglobin A1c  Tomorrow morning,   R     Signed and Held   Signed and Held  Lipid panel  Tomorrow morning,   R    Comments: Fasting    Signed and Held   Signed and Held  CBC  (enoxaparin (LOVENOX)    CrCl >/= 30 ml/min)  Once,   R    Comments: Baseline for enoxaparin therapy IF NOT ALREADY DRAWN.  Notify MD if PLT < 100 K.    Signed and Held   Signed and Held  Creatinine, serum  (enoxaparin (LOVENOX)    CrCl >/= 30 ml/min)  Once,   R    Comments: Baseline for enoxaparin therapy IF NOT ALREADY DRAWN.    Signed and Held   Signed and Held  Creatinine, serum  (enoxaparin (LOVENOX)    CrCl >/= 30 ml/min)  Weekly,   R    Comments: while on enoxaparin therapy    Signed and Held          Vitals/Pain Today's Vitals   01/05/19 1938 01/05/19 2025 01/05/19 2030 01/05/19 2046  BP:  (!) 149/80  (!) 193/63  Pulse:  60  69  Resp:    18  Temp:      TempSrc:      SpO2:  97%  97%  Weight:      Height:      PainSc: 0-No pain  0-No pain     Isolation Precautions No  active isolations  Medications Medications  hydrALAZINE (APRESOLINE) injection 20 mg (0 mg Intravenous Hold 01/05/19 2027)  haloperidol lactate (HALDOL) injection 5 mg (5 mg Intravenous Given 01/05/19 1702)  furosemide (LASIX) injection 40 mg (40 mg Intravenous Given 01/05/19 2036)    Mobility walks with person assist High fall risk   Focused Assessments Neuro Assessment Handoff:  Swallow screen pass? Yes  Cardiac Rhythm: Normal sinus rhythm NIH Stroke Scale ( + Modified Stroke Scale Criteria)  Interval: Initial Level of Consciousness (1a.)   : Alert, keenly responsive LOC Questions (1b. )   +: Answers one question correctly(incorrect month) LOC Commands (1c. )   + : Performs both tasks correctly Best Gaze (2. )  +: Normal Visual (3. )  +: No visual loss Facial Palsy (4. )    : Minor paralysis Motor Arm, Left (5a. )   +: No drift Motor Arm, Right (5b. )   +: No drift Motor Leg, Left (6a. )   +: No drift Motor Leg, Right (6b. )   +: No drift Limb Ataxia (7. ): Absent Sensory (8. )   +: Normal, no sensory loss Best Language (9. )   +: No aphasia Dysarthria (10. ): Normal Extinction/Inattention (11.)   +: No Abnormality Modified SS Total  +: 1 Complete NIHSS TOTAL: 3 Last date known well: 01/05/19 Last time known well: 1100 Neuro Assessment: Exceptions to WDL Neuro Checks:   Initial (01/05/19 1443)  Last Documented NIHSS Modified Score: 1 (01/05/19 2135) Has TPA been given? No If patient is a Neuro Trauma and patient is going to OR before floor call report to [place] nurse: [phone] or [phone]     R Recommendations: See Admitting Provider Note  Report given to:  Additional Notes:

## 2019-01-05 NOTE — Code Documentation (Signed)
Per son patient awoke this morning at his normal baseline about 1100 his son and wife noticed that he was confused and at times seemed to have trouble finding his words.  Patient arrived via EMS at 1408.  Code stroke called at 1519. Stat labs and head CT done.  Dr Erlinda Hong at bedside to assess patient.  NIHSS 3: facial droop and unable to answer questions.  Patient is generally confused.  BP 219/57  HR 62.  Manual BP obtained 152/52  CTA head and neck ordered but patient unable to lay flat for testing.  No acute treatment, plan Neuro check q 2 hours

## 2019-01-05 NOTE — ED Notes (Signed)
Physician Assistant is completing a stroke test at this time. The patient is complaining of no pain at this time, the family states that he is not presenting as his baseline. Family states he is exhibiting confusion and disorientation. Patient was initially unable to recall the year and month, then was able to recall the year.

## 2019-01-05 NOTE — Progress Notes (Addendum)
History and Physical    David [name] BPZ:[id number] DOB: December 28, 1940 DOA: 01/05/2019  PCP: [name], MD  Patient coming from: Home  Chief Complaint: Confusion  HPI: David [name] is a 78 y.o. male with medical history significant of previous CVA, Sinus node dysfunction with pacemaker in place, IDDM with insulin pump in place, OSA, CKD, CAD, BPH, Afib with RVR, HLD who presented for acute neurological changes.  The patient does not remember much of this and reports that he feels fine.  His son, who is in the room, supplied the following history.  Son reported that patient was in his normal state of health this morning.  At 1100, he began to be confused and was unable to identify family members.  He had waxing and waning garbled speech and some increased weakness.  The patient denies recent illness, fever, chest pain, chills, nausea, vomiting, abdominal pain, dysuria, change in urinary or bowel habits.  He notes only some increased SOB recently and inability to lie flat.  His Cardiologist, Dr. Jens Som, increased his torsemide for this issue yesterday.  He has a history of DM and is using an insulin pump.  Reviewed Dr. Dwyane Dee last note, A1C was 9.0.  He was noted to not be bolusing appropriately before meals.    ED Course: In the ED, he had blood work which showed elevated glucose, elevated BUN/Cr, but in range of his usual.  BNP was 429 - no previous in system.  Most recently LDL from March was 56.  WBC was 11.2 with a neutrophil predominance.  CXR showed cardiomegaly with no other pulmonary issues.  H/H were 11.6 and 37.  MCV was 94.  SARS-CoV2 is pending.    Review of Systems: As per HPI otherwise 10 point review of systems negative.    Past Medical History:  Diagnosis Date  . Acute cholecystitis s/p lap cholecystectomy 03/05/2017 03/04/2017  . Anemia, iron deficiency   . Anxiety   . Arthritis   . Atrial fibrillation with rapid ventricular response ([place])   . BPH (benign prostatic  hypertrophy)   . CAD (coronary artery disease)    Nonobstructive CAD per cath  . Cardiac pacemaker in situ   . CHF (congestive heart failure) ([place])   . Chronic ulcer of right foot ([place])   . CKD (chronic kidney disease) stage 4, GFR 15-29 ml/min (HCC) 09/26/2017  . CKD (chronic kidney disease), stage III ([place]) secondary to DM and HTN   nephrologist-  [name]  . Coronary artery disease involving native coronary artery of native heart without angina pectoris   . Diastolic dysfunction   . Dyspnea   . 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 History of cellulitis    right great toe 10-25-2014  . History of skin cancer   . HOCM (hypertrophic obstructive cardiomyopathy) ([place])   . Hyperlipidemia associated with type 2 diabetes mellitus ([place]) 10/25/2013  . Hypertension   . Hypertension associated with diabetes ([place]) 10/25/2013  . 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 Insulin dependent type 2 diabetes mellitus ([place]) 1991   followd by [name]--  has insulin pump  . Insulin pump in place   . OSA on CPAP   . 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 Peripheral neuropathy    severe  . Peripheral vascular disease ([place])    bilateral lower extremities  . PVD (peripheral vascular disease) ([place])   . Rib fracture 07/24/2015  .  Seasonal and perennial allergic rhinitis 10/25/2013  . Seborrheic keratosis 09/19/2016  . Secondary hyperparathyroidism of renal origin ([place])   . 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 Sinus node dysfunction (H[facility])   . Sleep apnea   . Syncope 07/24/2015    Past Surgical History:  Procedure Laterality Date  . 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 AMPUTATION TOE Right 03/12/2015   Procedure: RIGHT HALLUS AMPUTATION ;  Surgeon: [name], MD;  Location: [facility];  Service: Podiatry;  Laterality: Right;  . CARDIAC CATHETERIZATION  11-25-2010   [place], [place]    Nonobstructive CAD  . CARDIAC PACEMAKER PLACEMENT  Nov 2009   [employer]  . CHOLECYSTECTOMY N/A 03/05/2017   Procedure: LAPAROSCOPIC CHOLECYSTECTOMY WITH INTRAOPERATIVE CHOLANGIOGRAM;  Surgeon: [name], MD;  Location: [facility];  Service: General;  Laterality: N/A;  . EP IMPLANTABLE DEVICE N/A 06/03/2015   Procedure: PPM Generator Changeout;  Surgeon: [name], MD;  Location: [facility];  Service: Cardiovascular;  Laterality: N/A;  . EXCISION BONE CYST Right 03/06/2015   Procedure: BONE BIOPSIES OF RIGHT FOOT;  Surgeon: [name], MD;  Location: [facility];  Service: Podiatry;  Laterality: Right;  . ORIF ANKLE FRACTURE Left 11/06/2014   Procedure: OPEN REDUCTION INTERNAL FIXATION (ORIF) LEFT  ANKLE FRACTURE;  Surgeon: [name], MD;  Location: [facility];  Service: Orthopedics;  Laterality: Left;  . TEE WITHOUT CARDIOVERSION N/A 09/01/2017   Procedure: TRANSESOPHAGEAL ECHOCARDIOGRAM (TEE);  Surgeon: [name], MD;  Location: [facility];  Service: Cardiovascular;  Laterality: N/A;  . TOTAL KNEE ARTHROPLASTY    . VEIN LIGATION AND STRIPPING     Reviewed with patient.   reports that he quit smoking about 34 years ago. He has a 60.00 pack-year smoking history. He has never used smokeless tobacco. He reports current alcohol use of about 1.0 standard drinks of alcohol per week. He reports that he does not use drugs.  Allergies  Allergen Reactions  . Ativan [Lorazepam] Anxiety    Pt gets more agitated  . Adhesive [Tape] Other (See Comments)    blisters   Reviewed with patient.   Family History  Problem Relation Age of Onset  . Cancer Mother        breast  . Heart attack Father   . Stroke Father     Prior to Admission medications   Medication Sig Start Date End Date Taking? Authorizing Provider  acetaminophen (TYLENOL) 325 MG tablet Take 1-2 tablets (325-650 mg total) by mouth every 4 (four) hours as needed for mild pain. 09/12/17  Yes  Love, Ivan Anchors, PA-C  atorvastatin (LIPITOR) 10 MG tablet TAKE 1 TABLET BY MOUTH ONCE DAILY AT  6  PM 09/18/18  Yes [name], MD  calcitRIOL (ROCALTROL) 0.25 MCG capsule Take 1 capsule by mouth once daily 09/13/18  Yes [name], MD  carvedilol (COREG) 25 MG tablet TAKE 1 TABLET BY MOUTH TWICE DAILY WITH A MEAL Patient taking differently: Take 25 mg by mouth 2 (two) times daily with a meal.  09/13/18  Yes [name], MD  cholecalciferol (VITAMIN D) 1000 units tablet Take 1 tablet (1,000 Units total) by mouth daily. 02/01/17  Yes [name], MD  clopidogrel (PLAVIX) 75 MG tablet Take 1 tablet by mouth once daily 09/13/18  Yes [name], MD  diclofenac sodium (VOLTAREN) 1 % GEL Apply 2 g topically 4 (four)  times daily. 11/11/18  Yes Wieters, Hallie C, PA-C  hydrALAZINE (APRESOLINE) 100 MG tablet Take 100 mg by mouth 3 (three) times daily. Take 1 tablet by mouth three times daily.   Yes [provider]  insulin regular human CONCENTRATED (HUMULIN R) 500 UNIT/ML injection USE 0.25 ML DAILY OR AS DIRECTED IN INSULIN PUMP. DX:E11.65 Patient taking differently: Inject 125 Units into the skin daily. USE 0.25 ML DAILY OR AS DIRECTED IN INSULIN PUMP. DX:E11.65 10/22/18  Yes Elayne Snare, MD  isosorbide mononitrate (IMDUR) 30 MG 24 hr tablet TAKE TWO TABLETS BY MOUTH ONCE DAILY Patient taking differently: Take 60 mg by mouth daily.  09/17/18  Yes [name], MD  pantoprazole (PROTONIX) 40 MG tablet TAKE ONE TABLET BY MOUTH ONCE DAILY Patient taking differently: Take 40 mg by mouth daily.  02/07/17  Yes [name], FNP  sertraline (ZOLOFT) 100 MG tablet Take 100 mg by mouth daily.   Yes [provider]  vitamin B-12 (CYANOCOBALAMIN) 100 MCG tablet Take 100 mcg by mouth daily.   Yes [provider]  liraglutide (VICTOZA) 18 MG/3ML SOPN Inject 0.3 mLs (1.8 mg total) into the skin daily before supper. Patient taking differently: Inject 2 mg into the skin  daily before supper.  10/31/18   Elayne Snare, MD  torsemide (DEMADEX) 20 MG tablet Take 2 tablets (40mg ) by mouth every OTHER day. Patient taking differently: Take 20 mg by mouth 2 (two) times daily.  01/03/19   [name], MD    Physical Exam: Constitutional: NAD, uncomfortable in bed, helped to bedside chair, call button within reach.  Vitals:   01/05/19 1733 01/05/19 1900 01/05/19 1930 01/05/19 2025  BP: (!) 194/52 (!) 178/45 (!) 189/66 (!) 149/80  Pulse: 78 61 61 60  Resp: 18     Temp:      TempSrc:      SpO2: 97% 97% 97% 97%  Weight:      Height:       Eyes: PERRL, lids and conjunctivae normal ENMT: Mucous membranes are moist. Posterior pharynx clear of any exudate or lesions.   Neck: normal, supple Respiratory: Relatively clear, no apparent crackles at bases, no rales, no wheezing Cardiovascular: Regular, normal rate, no murmur.  PCM in place in left upper chest without any pain or issues at site.  He has chronic LE swelling with skin changes, mild pitting.  Abdomen: Distended, no pain, Insulin pump in place.   Musculoskeletal: Decreased muscle tone in arms, legs with edema.  Skin: He has extensive skin changes due to sun on arms, face, possible rosacea changes to face and nose.  He has chronic skin changes to mid calves on his legs due to edema and venous insufficiency Neurologic: CN 2-12 grossly intact. Sensation intact to light touch, he has a mild left sided facial droop.  Able to follow commands without issue.  Needed help to transfer from bed to chair (not normal for him) Psychiatric: Normal judgment and insight. Alert and oriented x 3. Normal mood.    Labs on Admission: I have personally reviewed following labs and imaging studies  CBC: Recent Labs  Lab 01/05/19 1434  WBC 11.2*  NEUTROABS 8.3*  HGB 11.6*  HCT 37.0*  MCV 94.4  PLT 916   Basic Metabolic Panel: Recent Labs  Lab 01/05/19 1434  NA 139  K 4.4  CL 107  CO2 22  GLUCOSE 189*  BUN 43*   CREATININE 1.85*  CALCIUM 9.3   GFR: Estimated  Creatinine Clearance: 48.7 mL/min (A) (by C-G formula based on SCr of 1.85 mg/dL (H)). Liver Function Tests: Recent Labs  Lab 01/05/19 1434  AST 27  ALT 29  ALKPHOS 85  BILITOT 0.7  PROT 7.0  ALBUMIN 3.7   No results for input(s): LIPASE, AMYLASE in the last 168 hours. No results for input(s): AMMONIA in the last 168 hours. Coagulation Profile: Recent Labs  Lab 01/05/19 1434  INR 1.0   Cardiac Enzymes: No results for input(s): CKTOTAL, CKMB, CKMBINDEX, TROPONINI in the last 168 hours. BNP (last 3 results) No results for input(s): PROBNP in the last 8760 hours. HbA1C: No results for input(s): HGBA1C in the last 72 hours. CBG: Recent Labs  Lab 01/05/19 1449  GLUCAP 192*   Lipid Profile: No results for input(s): CHOL, HDL, LDLCALC, TRIG, CHOLHDL, LDLDIRECT in the last 72 hours. Thyroid Function Tests: No results for input(s): TSH, T4TOTAL, FREET4, T3FREE, THYROIDAB in the last 72 hours. Anemia Panel: No results for input(s): VITAMINB12, FOLATE, FERRITIN, TIBC, IRON, RETICCTPCT in the last 72 hours. Urine analysis:    Component Value Date/Time   COLORURINE STRAW (A) 01/05/2019 1416   APPEARANCEUR CLEAR 01/05/2019 1416   LABSPEC 1.008 01/05/2019 1416   PHURINE 5.0 01/05/2019 1416   GLUCOSEU NEGATIVE 01/05/2019 1416   GLUCOSEU NEGATIVE 03/03/2015 1501   HGBUR SMALL (A) 01/05/2019 1416   BILIRUBINUR NEGATIVE 01/05/2019 1416   KETONESUR NEGATIVE 01/05/2019 1416   PROTEINUR 30 (A) 01/05/2019 1416   UROBILINOGEN 0.2 03/03/2015 1501   NITRITE NEGATIVE 01/05/2019 1416   LEUKOCYTESUR NEGATIVE 01/05/2019 1416    Radiological Exams on Admission: Dg Chest Portable 1 View  Result Date: 01/05/2019 CLINICAL DATA:  Confusion.  Disorientation. EXAM: PORTABLE CHEST 1 VIEW COMPARISON:  09/02/2017 FINDINGS: 1647 hours. The lungs are clear without focal pneumonia, edema, pneumothorax or pleural effusion. The cardio pericardial  silhouette is enlarged. Left permanent pacemaker again noted. The visualized bony structures of the thorax are intact. Telemetry leads overlie the chest. IMPRESSION: Cardiomegaly without acute cardiopulmonary findings. Electronically Signed   By: Misty Stanley [name]   On: 01/05/2019 17:04   Ct Head Code Stroke Wo Contrast  Result Date: 01/05/2019 CLINICAL DATA:  Code stroke.  Expressive aphasia EXAM: CT HEAD WITHOUT CONTRAST TECHNIQUE: Contiguous axial images were obtained from the base of the skull through the vertex without intravenous contrast. COMPARISON:  08/30/2017 FINDINGS: Brain: No evidence of acute infarction, hemorrhage, hydrocephalus, extra-axial collection or mass lesion/mass effect. Small vessel ischemic gliosis in the deep cerebral white matter. Mild for age volume loss. Small calcifications along the right cerebral convexity, unchanged. These could be vascular or subarachnoid/inflammatory. Vascular: Atherosclerotic calcification.  No hyperdense vessel. Skull: No acute or aggressive finding Sinuses/Orbits: Bilateral cataract resection Other: These results were communicated to Xu at 3:45 pmon 8/22/2020by text page via the [facility] messaging system. ASPECTS [facility] Stroke Program Early CT Score) - Ganglionic level infarction (caudate, lentiform nuclei, internal capsule, insula, M1-M3 cortex): 7 - Supraganglionic infarction (M4-M6 cortex): 3 Total score (0-10 with 10 being normal): 10 IMPRESSION: 1. No acute finding.  ASPECTS is 10. 2. Atrophy and chronic small vessel ischemia. Electronically Signed   By: Monte Fantasia [name]   On: 01/05/2019 15:46    EKG: Pending  Assessment/Plan Acute encephalopathy, aphasia - DDx includes acute CVA (only with chronic left sided facial droop) vs. Hypertensive encephalopathy vs. Metabolic encephalopathy vs. Occult seizure vs. Other - MRI/MRA if possible (has Pacemaker) for evaluation of PRES and stroke - If not possible, would  consider CTA head and neck -  Neurology following, reviewed recommendations - Have asked EDP to have pacemaker interrogated - Telemetry - Check A1C (last 9.0) - Check lipid panel - Continue statin, plavix - If PRES: Diastolic pressures have been consistently 50 - 80.  Goal would be to lower BP about 25% from presenting and then slowly lower.  More information under HTN.   - EEG - WBC mildly elevated, UA not indicative of infection, SARS CoV testing pending - Repeat CBC in the AM - Monitor for fever or other source of infection.  - NPO - PT/OT/SLP evaluation - Frequent neuro checks    Hypertension associated with diabetes Accelerated Hypertension - Patient presented with very high blood pressures, initially 222/66 - He was given 1 dose of lasix and now BP is closer to his baseline of 140s/80s - Hold BP medications, no need to treat if < 220/120, decreased rate controlling medication to 12.5mg  BID - Monitor on telemetry  Nonobstructive CAD s/p PCI 2012 Hyperlipidemia associated with type 2 diabetes mellitus  PVD (peripheral vascular disease) - No chest pain, no leg pain - Last LDL was < 100 - Continue statin, plavix  Atrial fibrillation, rate controlled, pacemaker in place - Interrogate pacemaker - EKG - Telemetry - Decrease coreg to 12.5mg  BID to allow for more permissive HTN, but continued rate control.   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    DVT prophylaxis: Lovenox  Code Status: Full  Family Communication: Son, David [name], at bedside Disposition Plan: Admit for stroke work up  [employer] called: Neurology, Dr. Erlinda Hong  Admission status: Med Tele, Obs   Gilles Chiquito MD [facility]   If 7PM-7AM, please contact night-coverage [email or web address] Password [facility]  01/05/2019, 8:32 PM

## 2019-01-05 NOTE — ED Notes (Signed)
Per family clarification, around 11 this morning was the onset of his symptoms and they have worsened over the course of the day.

## 2019-01-05 NOTE — ED Notes (Signed)
Pt sitting comfortably in chair.

## 2019-01-05 NOTE — Consult Note (Addendum)
NEURO HOSPITALIST  CONSULT   Requesting Physician: Dr. Sherry Ruffing    Chief Complaint: confusion  History obtained from:  Patient / family   HPI:                                                                                                                                         David [name] is an 78 y.o. male  With PMH OSA ( cpap), SVA ( 08/2017), CKD, CHF, DM2, pacemaker, PVD, HLD, CAD who presented to [facility] ED with c/o worsening aphasia and increased confusion.  Code stroke called in ED.   Patient confusion waxes and wanes. Patient unreliable historian and history gathered from son. Son states that patient was normal this morning when he woke up. At 1100 this morning the confusion set in. Patient was unable to identify his wife and his son, but it resolved. He at times would speak and no one would be able to understand him, and at times his speech was clear.   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 Per Dr. Leonie Man note 09/03/17.   Date last known well: 01/04/2019 Time last known well: 1059 tPA Given: no; presented outside of window Modified Rankin: Rankin Score=1 NIHSS:3; facial droop, questions   Past Medical History:  Diagnosis Date  . Acute cholecystitis s/p lap cholecystectomy 03/05/2017 03/04/2017  . Anemia, iron deficiency   . Anxiety   . Arthritis   . Atrial fibrillation with rapid ventricular response ([place])   . BPH (benign prostatic hypertrophy)   . CAD (coronary artery disease)    Nonobstructive CAD per cath  . Cardiac pacemaker in situ   . CHF (congestive heart failure) ([place])   . Chronic ulcer of right foot ([place])   . CKD (chronic kidney disease) stage 4, GFR 15-29 ml/min (HCC) 09/26/2017  . CKD (chronic kidney disease), stage III ([place]) secondary to DM and  HTN   nephrologist-  [name]  . Coronary artery disease involving native coronary artery of native heart without angina pectoris   . Diastolic dysfunction   . Dyspnea   . 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 History of cellulitis    right great toe 10-25-2014  . History of skin cancer   . HOCM (hypertrophic obstructive cardiomyopathy) ([place])   . Hyperlipidemia associated with type 2 diabetes mellitus ([place]) 10/25/2013  . Hypertension   . Hypertension associated with diabetes ([place]) 10/25/2013  . IDDM (  insulin dependent diabetes mellitus) - on insulin pump 03/04/2017  . Insulin dependent type 2 diabetes mellitus ([place]) 1991   followd by [name]--  has insulin pump  . Insulin pump in place   . OSA on CPAP   . 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 Peripheral neuropathy    severe  . Peripheral vascular disease ([place])    bilateral lower extremities  . PVD (peripheral vascular disease) ([place])   . Rib fracture 07/24/2015  . Seasonal and perennial allergic rhinitis 10/25/2013  . Seborrheic keratosis 09/19/2016  . Secondary hyperparathyroidism of renal origin ([place])   . 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 Sinus node dysfunction (HCC)   . Sleep apnea   . Syncope 07/24/2015    Past Surgical History:  Procedure Laterality Date  . AMPUTATION OF REPLICATED TOES  Mar 4695   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)  . AMPUTATION TOE Right 03/12/2015   Procedure: RIGHT HALLUS AMPUTATION ;  Surgeon: [name], MD;  Location: [facility];  Service: Podiatry;  Laterality: Right;  . CARDIAC CATHETERIZATION  11-25-2010   [place], [place]   Nonobstructive CAD  . CARDIAC PACEMAKER PLACEMENT  Nov 2009   [facility]  . CHOLECYSTECTOMY N/A 03/05/2017   Procedure: LAPAROSCOPIC CHOLECYSTECTOMY WITH INTRAOPERATIVE CHOLANGIOGRAM;  Surgeon: [name], MD;  Location: [facility];  Service: General;  Laterality: N/A;  . EP IMPLANTABLE DEVICE N/A 06/03/2015   Procedure: PPM  Generator Changeout;  Surgeon: [name], MD;  Location: [facility];  Service: Cardiovascular;  Laterality: N/A;  . EXCISION BONE CYST Right 03/06/2015   Procedure: BONE BIOPSIES OF RIGHT FOOT;  Surgeon: [name], MD;  Location: [facility];  Service: Podiatry;  Laterality: Right;  . ORIF ANKLE FRACTURE Left 11/06/2014   Procedure: OPEN REDUCTION INTERNAL FIXATION (ORIF) LEFT  ANKLE FRACTURE;  Surgeon: [name], MD;  Location: [facility];  Service: Orthopedics;  Laterality: Left;  . TEE WITHOUT CARDIOVERSION N/A 09/01/2017   Procedure: TRANSESOPHAGEAL ECHOCARDIOGRAM (TEE);  Surgeon: [name], MD;  Location: [facility];  Service: Cardiovascular;  Laterality: N/A;  . TOTAL KNEE ARTHROPLASTY    . VEIN LIGATION AND STRIPPING      Family History  Problem Relation Age of Onset  . Cancer Mother        breast  . Heart attack Father   . Stroke Father     Social History:  reports that he quit smoking about 34 years ago. He has a 60.00 pack-year smoking history. He has never used smokeless tobacco. He reports current alcohol use of about 1.0 standard drinks of alcohol per week. He reports that he does not use drugs.  Allergies:  Allergies  Allergen Reactions  . Adhesive [Tape] Other (See Comments)    blisters    Medications:  No current facility-administered medications for this encounter.    Current Outpatient Medications  Medication Sig Dispense Refill  . acetaminophen (TYLENOL) 325 MG tablet Take 1-2 tablets (325-650 mg total) by mouth every 4 (four) hours as needed for mild pain.    Marland Kitchen atorvastatin (LIPITOR) 10 MG tablet TAKE 1 TABLET BY MOUTH ONCE DAILY AT  6  PM 90 tablet 0  . calcitRIOL (ROCALTROL) 0.25 MCG capsule Take 1 capsule by mouth once daily 90 capsule 0  . carvedilol (COREG) 25 MG tablet TAKE 1 TABLET BY  MOUTH TWICE DAILY WITH A MEAL 180 tablet 0  . cholecalciferol (VITAMIN D) 1000 units tablet Take 1 tablet (1,000 Units total) by mouth daily. 30 tablet 1  . clopidogrel (PLAVIX) 75 MG tablet Take 1 tablet by mouth once daily 90 tablet 0  . diclofenac sodium (VOLTAREN) 1 % GEL Apply 2 g topically 4 (four) times daily. 150 g 0  . hydrALAZINE (APRESOLINE) 100 MG tablet Take 100 mg by mouth 3 (three) times daily. Take 1 tablet by mouth three times daily.    . insulin regular human CONCENTRATED (HUMULIN R) 500 UNIT/ML injection USE 0.25 ML DAILY OR AS DIRECTED IN INSULIN PUMP. DX:E11.65 20 mL 2  . isosorbide mononitrate (IMDUR) 30 MG 24 hr tablet TAKE TWO TABLETS BY MOUTH ONCE DAILY 60 tablet 0  . liraglutide (VICTOZA) 18 MG/3ML SOPN Inject 0.3 mLs (1.8 mg total) into the skin daily before supper. (Patient taking differently: Inject 1.8 mg into the skin daily before supper. 0.54ms per wife) 9 pen 1  . pantoprazole (PROTONIX) 40 MG tablet TAKE ONE TABLET BY MOUTH ONCE DAILY 90 tablet 1  . sertraline (ZOLOFT) 100 MG tablet Take 100 mg by mouth daily.    .Marland Kitchentorsemide (DEMADEX) 20 MG tablet Take 2 tablets (424m by mouth every OTHER day. 180 tablet 3  . vitamin B-12 (CYANOCOBALAMIN) 100 MCG tablet Take 100 mcg by mouth daily.        ROS:                                                                                                                                       ROS was performed and is negative except as noted in HPI    General Examination:                                                                                                      Blood pressure (!) 222/66, pulse 65, temperature 98.4  F (36.9 C), temperature source Oral, resp. rate 18, height '6\' 4"'$  (1.93 m), weight 131.5 kg, SpO2 99 %.  HEENT-  Normocephalic, no lesions, without obvious abnormality.  Normal external eye and conjunctiva. Cardiovascular- pulses palpable throughout  Lungs-  Saturations within normal limits on  RA Extremities- Warm, dry and intact Musculoskeletal- right foot with toe amputation  Skin-warm and dry, no hyperpigmentation, vitiligo, or suspicious lesions  Neurological Examination Mental Status: Alert, oriented, thought content appropriate.  Some expressive aphasia noted.  Able to follow commands without difficulty. Cranial Nerves: II:  Visual fields grossly normal,  III,IV, VI: ptosis not present, extra-ocular motions intact bilaterally, pupils equal, round, reactive to light and accommodation V,VII: smile symmetric, facial light touch sensation normal bilaterally VIII:bilateral hearing loss with hearing aids IX,X: uvula rises midline XI: bilateral shoulder shrug XII: midline tongue extension Motor: Right : Upper extremity   5/5  Left:     Upper extremity   5/5  Lower extremity   5/5   Lower extremity   5/5 Tone and bulk:normal tone throughout; no atrophy noted Sensory:light touch intact throughout, bilaterally Cerebellar: normal finger-to-nose,  Gait: deferred   Lab Results: Basic Metabolic Panel: Recent Labs  Lab 01/05/19 1434  NA 139  K 4.4  CL 107  CO2 22  GLUCOSE 189*  BUN 43*  CREATININE 1.85*  CALCIUM 9.3    CBC: Recent Labs  Lab 01/05/19 1434  WBC 11.2*  NEUTROABS 8.3*  HGB 11.6*  HCT 37.0*  MCV 94.4  PLT 224   CBG: Recent Labs  Lab 01/05/19 1449  GLUCAP 192*    Imaging: Ct Head Code Stroke Wo Contrast  Result Date: 01/05/2019 CLINICAL DATA:  Code stroke.  Expressive aphasia EXAM: CT HEAD WITHOUT CONTRAST TECHNIQUE: Contiguous axial images were obtained from the base of the skull through the vertex without intravenous contrast. COMPARISON:  08/30/2017 FINDINGS: Brain: No evidence of acute infarction, hemorrhage, hydrocephalus, extra-axial collection or mass lesion/mass effect. Small vessel ischemic gliosis in the deep cerebral white matter. Mild for age volume loss. Small calcifications along the right cerebral convexity, unchanged. These  could be vascular or subarachnoid/inflammatory. Vascular: Atherosclerotic calcification.  No hyperdense vessel. Skull: No acute or aggressive finding Sinuses/Orbits: Bilateral cataract resection Other: These results were communicated to Kashana Breach at 3:45 pmon 8/22/2020by text page via the [facility] messaging system. ASPECTS [facility] Stroke Program Early CT Score) - Ganglionic level infarction (caudate, lentiform nuclei, internal capsule, insula, M1-M3 cortex): 7 - Supraganglionic infarction (M4-M6 cortex): 3 Total score (0-10 with 10 being normal): 10 IMPRESSION: 1. No acute finding.  ASPECTS is 10. 2. Atrophy and chronic small vessel ischemia. Electronically Signed   By: Monte Fantasia [name]   On: 01/05/2019 15:46       Laurey Morale, MSN, NP-C [facility] [phone]  01/05/2019, 3:36 PM   Attending physician note to follow with Assessment and plan .  --please page stroke NP  Or  PA  Or MD from 8am -4 pm  as this patient from this time will be  followed by the stroke.   You can look them up on [email or web address]  Password [email or web address]    ATTENDING NOTE: I reviewed above note and agree with the assessment and plan. Pt was seen and examined.   78 year old male with history of hypertension, diabetes, CHF status post pacemaker, CKD, OSA, PVD, CAD presented to ER for confusion.  As per son, patient woke up this morning normal, around 11:00 patient started to have confusion, not able to do  his usual tasks, paraphasic errors when talking, not recognize son but able to recognize wife, shaking has seen "something wrong" "what is wrong with me".  Son denies any arm or leg weakness, vision changes or slurred speech.  With EMS and on arrival to ED, patient BP was high.  SBP in 200s.  Patient did not take BP meds at home for several days.  Patient was admitted in 08/2017 for left-sided weakness.  MRI showed 2 small right frontal cortical infarcts.  Patient not cooperative with MRA and carotid Doppler, however,  right ICA no stenosis.  EF 60 to 65% and TEE showed no PFO.  LDL 106 and A1c 7.6.  He was put on aspirin 325 and Plavix 75 3 weeks and then Plavix alone as well as Lipitor 10.  He follow with Dr. Leonie Man at [facility], on Plavix PTA.  I met patient in the CT scanner, he had fluctuation of mental status with occasionally not knowing age or months.  No significant focal deficit, moving all extremities equally, no aphasia, no dysarthria, no visual field deficit.  Equivocal right nasolabial fold flattening.  However, patient does have orthopnea, not able to lie flat for CTA head and neck study.  CT head no acute abnormality.  BP is still high at 219/57.  Currently not able to do MRI or MRI given Orthopnea.  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  However, given uncontrolled risk factors, history of stroke, would like to have MRI rule out stroke.     Recommend admission to IM for blood pressure management  MRI/MRA if possible or once able to lie flat for stroke evaluation and to rule out PRES  Routine EEG to rule out seizure    Permissive hypertension, no need to treat if BP less than 220/120.  Gradually reduce BP no more than 25% in 24 hours.    Continue gentle diuresis for CHF and orthopnea.    Recommend pacemaker interrogation to rule out A. Fib.  Continue plavix and lipitor for stroke prevention  Will follow  Rosalin Hawking, MD PhD Stroke Neurology 01/05/2019 4:39 PM

## 2019-01-05 NOTE — ED Notes (Signed)
Per Angela Nevin at [facility], 1 episode of non sustained VT 12/09/18, everything functioning as programmed.

## 2019-01-05 NOTE — ED Provider Notes (Addendum)
[facility]ERG[facility] DEPARTMENT Provider Note   CSN: [id number] Arrival date & time: 01/05/19  1408   History   Chief Complaint Chief Complaint  Patient presents with  . Altered Mental Status    HPI David [name] is a 78 y.o. male who presents with confusion and facial droop. PMH significant for hx of CVA, HTN, HLD, CAD, CHF, insulin dependent DM, anemia, A.fib not on anticoagulation. The patient states that he's not exactly sure why he is here. He thinks it's because he's confused. His son is at beside and states that this morning the patient woke up and he just noticed he was "groggy" but no overt confusion. Around 11AM he started to really notice that the patient had difficulty with his memory and had difficulty completing simple tasks. Last seen normal is unclear. He was normal when he went to be last night. The patient also states that he feels very cold and he's had a cough and some more trouble breathing recently. He has his diurectic dose increased because of this. He also had an episode of low blood sugar this morning (60s) and ate something and this improved. Otherwise no headache, dizziness, vision changes, N/V, chest pain, abdominal pain, weakness, numbness or tingling.    HPI  Past Medical History:  Diagnosis Date  . Acute cholecystitis s/p lap cholecystectomy 03/05/2017 03/04/2017  . Anemia, iron deficiency   . Anxiety   . Arthritis   . Atrial fibrillation with rapid ventricular response ([place])   . BPH (benign prostatic hypertrophy)   . CAD (coronary artery disease)    Nonobstructive CAD per cath  . Cardiac pacemaker in situ   . CHF (congestive heart failure) ([place])   . Chronic ulcer of right foot ([place])   . CKD (chronic kidney disease) stage 4, GFR 15-29 ml/min (HCC) 09/26/2017  . CKD (chronic kidney disease), stage III ([place]) secondary to DM and HTN   nephrologist-  [name]  . Coronary artery disease involving native coronary artery of native  heart without angina pectoris   . Diastolic dysfunction   . Dyspnea   . 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 History of cellulitis    right great toe 10-25-2014  . History of skin cancer   . HOCM (hypertrophic obstructive cardiomyopathy) ([place])   . Hyperlipidemia associated with type 2 diabetes mellitus ([place]) 10/25/2013  . Hypertension   . Hypertension associated with diabetes ([place]) 10/25/2013  . 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 Insulin pump in place   . OSA on CPAP   . 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 Peripheral neuropathy    severe  . Peripheral vascular disease ([place])    bilateral lower extremities  . PVD (peripheral vascular disease) ([place])   . Rib fracture 07/24/2015  . Seasonal and perennial allergic rhinitis 10/25/2013  . Seborrheic keratosis 09/19/2016  . Secondary hyperparathyroidism of renal origin ([place])   . 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 Sinus node dysfunction (HCC)   . Sleep apnea   . Syncope 07/24/2015    Patient Active Problem List   Diagnosis Date Noted  . Memory change 11/19/2018  . Senile purpura ([place]) 11/27/2017  . CKD (chronic kidney disease) stage 4, GFR 15-29 ml/min (HCC) 09/26/2017  . 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 Nonobstructive CAD s/p PCI 2012   . Cardiac pacemaker in situ   . PVD (peripheral vascular disease) ([place])   . Anxiety 03/22/2017  . Acute cholecystitis s/p lap cholecystectomy 03/05/2017 03/04/2017  . 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[place]) 03/04/2017  . 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 Fatigue 01/27/2017  . Low vitamin B12 level 01/27/2017  . OSA on CPAP 10/26/2014  . Seasonal and perennial allergic rhinitis 10/25/2013  . Hypertension associated with diabetes ([place])  10/25/2013  . Hyperlipidemia associated with type 2 diabetes mellitus ([place]) 10/25/2013    Past Surgical History:  Procedure Laterality Date  . 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 AMPUTATION TOE Right 03/12/2015   Procedure: RIGHT HALLUS AMPUTATION ;  Surgeon: [name], MD;  Location: [facility];  Service: Podiatry;  Laterality: Right;  . CARDIAC CATHETERIZATION  11-25-2010   [place], [place]   Nonobstructive CAD  . CARDIAC PACEMAKER PLACEMENT  Nov 2009   [employer]  . CHOLECYSTECTOMY N/A 03/05/2017   Procedure: LAPAROSCOPIC CHOLECYSTECTOMY WITH INTRAOPERATIVE CHOLANGIOGRAM;  Surgeon: [name], MD;  Location: [facility];  Service: General;  Laterality: N/A;  . EP IMPLANTABLE DEVICE N/A 06/03/2015   Procedure: PPM Generator Changeout;  Surgeon: [name], MD;  Location: [facility];  Service: Cardiovascular;  Laterality: N/A;  . EXCISION BONE CYST Right 03/06/2015   Procedure: BONE BIOPSIES OF RIGHT FOOT;  Surgeon: [name], MD;  Location: [facility];  Service: Podiatry;  Laterality: Right;  . ORIF ANKLE FRACTURE Left 11/06/2014   Procedure: OPEN REDUCTION INTERNAL FIXATION (ORIF) LEFT  ANKLE FRACTURE;  Surgeon: [name], MD;  Location: [facility];  Service: Orthopedics;  Laterality: Left;  . TEE WITHOUT CARDIOVERSION N/A 09/01/2017   Procedure: TRANSESOPHAGEAL ECHOCARDIOGRAM (TEE);  Surgeon: [name], MD;  Location: [facility];  Service: Cardiovascular;  Laterality: N/A;  . TOTAL KNEE ARTHROPLASTY    . VEIN LIGATION AND STRIPPING          Home Medications    Prior to Admission medications   Medication Sig Start Date End Date Taking? Authorizing Provider  acetaminophen (TYLENOL) 325 MG tablet Take 1-2 tablets (325-650 mg total) by mouth every 4 (four) hours as needed for mild pain. 09/12/17   Love, Ivan Anchors, PA-C  atorvastatin (LIPITOR) 10 MG tablet TAKE 1 TABLET BY MOUTH  ONCE DAILY AT  6  PM 09/18/18   [name], MD  calcitRIOL (ROCALTROL) 0.25 MCG capsule Take 1 capsule by mouth once daily 09/13/18   [name], MD  carvedilol (COREG) 25 MG tablet TAKE 1 TABLET BY MOUTH TWICE DAILY WITH A MEAL 09/13/18   [name], MD  cholecalciferol (VITAMIN D) 1000 units tablet Take 1 tablet (1,000 Units total) by mouth daily. 02/01/17   [name], MD  clopidogrel (PLAVIX) 75 MG tablet Take 1 tablet by mouth once daily 09/13/18   [name], MD  diclofenac sodium (VOLTAREN) 1 % GEL Apply 2 g topically 4 (four) times daily. 11/11/18   Wieters, Hallie C, PA-C  hydrALAZINE (APRESOLINE) 100 MG tablet Take 100 mg by mouth 3 (three) times daily. Take 1 tablet by mouth three times daily.    [provider]  insulin regular human CONCENTRATED (HUMULIN R) 500 UNIT/ML injection USE 0.25 ML DAILY OR AS DIRECTED IN INSULIN PUMP. DX:E11.65 10/22/18   Elayne Snare, MD  isosorbide mononitrate (IMDUR) 30 MG 24 hr tablet TAKE TWO TABLETS BY MOUTH ONCE DAILY 09/17/18   [name], MD  liraglutide (VICTOZA) 18 MG/3ML SOPN Inject 0.3 mLs (1.8 mg total) into the skin daily before supper. Patient taking differently: Inject 1.8 mg into the skin daily before supper. 0.42mLs per wife 10/31/18   Elayne Snare, MD  pantoprazole (PROTONIX) 40 MG tablet TAKE ONE TABLET BY MOUTH ONCE DAILY 02/07/17   [name], FNP  sertraline (ZOLOFT) 100 MG tablet Take 100 mg by mouth daily.    [provider]  torsemide (DEMADEX) 20 MG tablet Take 2 tablets (40mg ) by mouth every OTHER day. 01/03/19   [name], MD  vitamin B-12 (CYANOCOBALAMIN) 100 MCG tablet Take 100 mcg by mouth daily.    [provider]    Family History Family History  Problem Relation Age of Onset  . Cancer Mother        breast  . Heart attack Father   . Stroke Father     Social History Social History   Tobacco Use  . Smoking status: Former Smoker    Packs/day: 2.00    Years:  30.00    Pack years: 60.00    Quit date: 03/03/1984    Years since quitting: 34.8  . Smokeless tobacco: Never Used  Substance Use Topics  . Alcohol use: Yes    Alcohol/week: 1.0 standard drinks    Types: 1 Glasses of wine per week    Comment: social  . Drug use: No     Allergies   Adhesive [tape]   Review of Systems Review of Systems  Constitutional: Positive for chills and fatigue. Negative for fever.  Eyes: Negative for visual disturbance.  Respiratory: Positive for cough and shortness of breath. Negative for wheezing.   Cardiovascular: Negative for chest pain.  Gastrointestinal: Negative for abdominal pain, nausea and vomiting.  Neurological: Negative for dizziness, syncope, weakness, numbness and headaches.  Psychiatric/Behavioral: Positive for confusion.  All other systems reviewed and are negative.    Physical Exam Updated Vital Signs BP (!) 222/66 (BP Location: Right Arm)   Pulse 65   Temp 98.4 F (36.9 C) (Oral)   Resp 18   Ht 6\' 4"  (1.93 m)   Wt 131.5 kg   SpO2 99%   BMI 35.29 kg/m   Physical Exam Vitals signs and nursing note reviewed.  Constitutional:      General: He is not in acute distress.    Appearance: He is well-developed. He is obese. He is not ill-appearing.     Comments: Obese, elderly male in NAD. Mild confusion noted. No aphasia or dysphasia  HENT:     Head: Normocephalic and atraumatic.  Eyes:     General: No scleral icterus.       Right eye: No discharge.        Left eye: No discharge.     Conjunctiva/sclera: Conjunctivae normal.     Pupils: Pupils are equal, round, and reactive to light.  Neck:     Musculoskeletal: Normal range of motion.  Cardiovascular:     Rate and Rhythm: Normal rate and regular rhythm.  Pulmonary:     Effort: Pulmonary effort is normal. Tachypnea present. No respiratory distress.     Breath sounds: Normal breath sounds.  Abdominal:     General: There is no distension.     Palpations: Abdomen is soft.      Tenderness: There is no abdominal tenderness.  Musculoskeletal:  Right lower leg: Edema present.     Left lower leg: Edema present.  Skin:    General: Skin is warm and dry.  Neurological:     Mental Status: He is alert and oriented to person, place, and time.     Comments: Mental Status:  Alert, disoriented, not able to give a clear history. Speech fluent without evidence of aphasia. Able to follow 2 step commands with some repetition.  Cranial Nerves:  II:  Peripheral visual fields grossly normal, pupils equal, round, reactive to light III,IV, VI: ptosis not present, extra-ocular motions intact bilaterally  V,VII: smile symmetric, facial light touch sensation equal VIII: hearing grossly normal to voice  X: uvula elevates symmetrically  XI: bilateral shoulder shrug symmetric and strong XII: midline tongue extension without fassiculations Motor:  Normal tone. 5/5 in upper and lower extremities bilaterally including strong and equal grip strength and dorsiflexion/plantar flexion Sensory: Pinprick and light touch normal in all extremities.  Cerebellar: normal finger-to-nose with bilateral upper extremities Gait: not tested CV: distal pulses palpable throughout    Psychiatric:        Behavior: Behavior normal.      ED Treatments / Results  Labs (all labs ordered are listed, but only abnormal results are displayed) Labs Reviewed  CBC - Abnormal; Notable for the following components:      Result Value   WBC 11.2 (*)    RBC 3.92 (*)    Hemoglobin 11.6 (*)    HCT 37.0 (*)    RDW 15.9 (*)    All other components within normal limits  DIFFERENTIAL - Abnormal; Notable for the following components:   Neutro Abs 8.3 (*)    Monocytes Absolute 1.2 (*)    All other components within normal limits  CBG MONITORING, ED - Abnormal; Notable for the following components:   Glucose-Capillary 192 (*)    All other components within normal limits  PROTIME-INR  APTT  ETHANOL   COMPREHENSIVE METABOLIC PANEL  RAPID URINE DRUG SCREEN, HOSP PERFORMED  URINALYSIS, ROUTINE W REFLEX MICROSCOPIC  I-STAT CHEM 8, ED    EKG None  Radiology Dg Chest Portable 1 View  Result Date: 01/05/2019 CLINICAL DATA:  Confusion.  Disorientation. EXAM: PORTABLE CHEST 1 VIEW COMPARISON:  09/02/2017 FINDINGS: 1647 hours. The lungs are clear without focal pneumonia, edema, pneumothorax or pleural effusion. The cardio pericardial silhouette is enlarged. Left permanent pacemaker again noted. The visualized bony structures of the thorax are intact. Telemetry leads overlie the chest. IMPRESSION: Cardiomegaly without acute cardiopulmonary findings. Electronically Signed   By: Misty Stanley [name]   On: 01/05/2019 17:04   Ct Head Code Stroke Wo Contrast  Result Date: 01/05/2019 CLINICAL DATA:  Code stroke.  Expressive aphasia EXAM: CT HEAD WITHOUT CONTRAST TECHNIQUE: Contiguous axial images were obtained from the base of the skull through the vertex without intravenous contrast. COMPARISON:  08/30/2017 FINDINGS: Brain: No evidence of acute infarction, hemorrhage, hydrocephalus, extra-axial collection or mass lesion/mass effect. Small vessel ischemic gliosis in the deep cerebral white matter. Mild for age volume loss. Small calcifications along the right cerebral convexity, unchanged. These could be vascular or subarachnoid/inflammatory. Vascular: Atherosclerotic calcification.  No hyperdense vessel. Skull: No acute or aggressive finding Sinuses/Orbits: Bilateral cataract resection Other: These results were communicated to Xu at 3:45 pmon 8/22/2020by text page via the [facility] messaging system. ASPECTS [facility] Stroke Program Early CT Score) - Ganglionic level infarction (caudate, lentiform nuclei, internal capsule, insula, M1-M3 cortex): 7 - Supraganglionic infarction (M4-M6 cortex): 3 Total score (  0-10 with 10 being normal): 10 IMPRESSION: 1. No acute finding.  ASPECTS is 10. 2. Atrophy and chronic small  vessel ischemia. Electronically Signed   By: Monte Fantasia [name]   On: 01/05/2019 15:46    Procedures Procedures (including critical care time)  Medications Ordered in ED Medications  haloperidol lactate (HALDOL) injection 5 mg (5 mg Intravenous Given 01/05/19 1702)     Initial Impression / Assessment and Plan / ED Course  I have reviewed the triage vital signs and the nursing notes.  Pertinent labs & imaging results that were available during my care of the patient were reviewed by me and considered in my medical decision making (see chart for details).  78 year old male presents with AMS. He has waxing and waning confusion and agitation as well as some mild left sided facial droop. He is significantly hypertensive here with SBP >200. Otherwise vitals are normal. I discussed with Dr. Lorraine Lax and due to significantly elevated BP and possibly being in the stroke window a code stroke was activated.   CT head is negative. CTA of head and neck were ordered but apparently pt could not lie flat due to SOB. Will add CXR and BNP.  Discussed with Dr. Erlinda Hong with neurology. He thinks he is encephalopathic. Apparently the patient has become quite agitated since he's been here and is demanding to leave. Unclear if there is underlying dementia issues. We discussed risks of leaving AMA and he doesn't care and wants his pants and to leave. I talked with his son outside the room and he said he is POA and does not want the pt to leave. Will order Haldol since pt has apparently had worsening agitation with Ativan in the past.  CBC is remarkable for mild leukocytosis of 11 and mild anemia of 11. BMP is remarkable for elevated BUN (43) and SCr (1.8) consistent with CKD. This is around his baseline. BNP is 429. CXR is clear.  [facility] to admit for further management, BP control, MRI (he has a pacemaker which is apparently MRI compatible)   Final Clinical Impressions(s) / ED Diagnoses   Final diagnoses:  Altered  mental status, unspecified altered mental status type    ED Discharge Orders    None           Iris Pert 01/05/19 2001    Tegeler, Gwenyth Allegra, MD 01/05/19 2232

## 2019-01-05 NOTE — ED Notes (Signed)
Pt sitting in chair with family at bedside. Pt a&Ox4. Pt informed to not transfer to bed without assistance. Call bell within reach. Room in front of nurses station. Family: Son to inform staff when leaving.

## 2019-01-05 NOTE — H&P (Signed)
Please see my previous note, inaccurately labeled Progress note.    Gilles Chiquito, MD

## 2019-01-05 NOTE — ED Triage Notes (Signed)
Pt arrived via EMS with a complaint of increased confusion and aphasia. Pt stated he has no pain and no complaints, the family was the one who called EMS. Edema and ascites were noted on inspection. Last known normal was 1030 pm the night before symptoms started, symptoms were first noted by the wife upon arousal from sleep. The pt has a history of stroke 1 year prior to this event.

## 2019-01-06 ENCOUNTER — Observation Stay (HOSPITAL_COMMUNITY): Payer: Medicare Other

## 2019-01-06 ENCOUNTER — Encounter (HOSPITAL_COMMUNITY): Payer: Self-pay

## 2019-01-06 DIAGNOSIS — R4182 Altered mental status, unspecified: Secondary | ICD-10-CM | POA: Diagnosis not present

## 2019-01-06 DIAGNOSIS — N184 Chronic kidney disease, stage 4 (severe): Secondary | ICD-10-CM

## 2019-01-06 LAB — GLUCOSE, CAPILLARY
Glucose-Capillary: 142 mg/dL — ABNORMAL HIGH (ref 70–99)
Glucose-Capillary: 240 mg/dL — ABNORMAL HIGH (ref 70–99)
Glucose-Capillary: 380 mg/dL — ABNORMAL HIGH (ref 70–99)
Glucose-Capillary: 369 mg/dL — ABNORMAL HIGH (ref 70–99)
Glucose-Capillary: 380 mg/dL — ABNORMAL HIGH (ref 70–99)
Glucose-Capillary: 381 mg/dL — ABNORMAL HIGH (ref 70–99)

## 2019-01-06 LAB — LIPID PANEL
Cholesterol: 138 mg/dL (ref 0–200)
HDL: 23 mg/dL — ABNORMAL LOW (ref >40)
LDL Cholesterol: 73 mg/dL (ref 0–99)
Total CHOL/HDL Ratio: 6 RATIO
Triglycerides: 210 mg/dL — ABNORMAL HIGH (ref <150)
VLDL: 42 mg/dL — ABNORMAL HIGH (ref 0–40)

## 2019-01-06 LAB — HEMOGLOBIN A1C
Hgb A1c MFr Bld: 7.4 % — ABNORMAL HIGH (ref 4.8–5.6)
Mean Plasma Glucose: 165.68 mg/dL

## 2019-01-06 MED ORDER — HALOPERIDOL LACTATE 5 MG/ML IJ SOLN
5.0000 mg | Freq: Once | INTRAMUSCULAR | Status: AC
  Administered 2019-01-06: 5 mg via INTRAVENOUS
  Filled 2019-01-06: qty 1

## 2019-01-06 MED ORDER — INSULIN ASPART 100 UNIT/ML ~~LOC~~ SOLN
0.0000 [IU] | SUBCUTANEOUS | Status: DC
  Administered 2019-01-07 (×3): 8 [IU] via SUBCUTANEOUS

## 2019-01-06 MED ORDER — INSULIN ASPART 100 UNIT/ML ~~LOC~~ SOLN
0.0000 [IU] | Freq: Every day | SUBCUTANEOUS | Status: DC
  Administered 2019-01-06: 5 [IU] via SUBCUTANEOUS

## 2019-01-06 MED ORDER — INSULIN ASPART 100 UNIT/ML ~~LOC~~ SOLN: 0.0000 [IU] | Freq: Three times a day (TID) | SUBCUTANEOUS | Status: DC

## 2019-01-06 NOTE — Evaluation (Signed)
Occupational Therapy Evaluation Patient Details Name: David [name] MRN: [id number] DOB: 05-19-1940 Today's Date: 01/06/2019    History of Present Illness 78 y.o. male with medical history significant of previous CVA, Sinus node dysfunction with pacemaker in place, IDDM with insulin pump in place, OSA, CKD, CAD, BPH, Afib with RVR, HLD who presented for acute neurological changes including c/o worsening aphasia and increased confusion. CT negative for acute finding, MRI pending.     Clinical Impression   PTA patient reports independent ADLs, mobility using RW/cane as needed, and driving. Admitted for above and limited by problem list below, including impaired balance, decreased activity tolerance, and impaired safety/cognition.  He currently requires min guard for transfer and in room mobility (using RW when fatigued), min guard for LB ADLs and toileting.  Min cueing to sequence grooming tasks standing at sink, noted increased difficulty with problem solving and sequencing throughout session and therefore recommend further functional cognitive testing with pill box test.  Noted hx of memory deficit at baseline. Noted increased WOB with minimal in room activity, SpO2 >95% but pt fatigues easily.  Based on performance today, patient will benefit from continued OT services while admitted and [facility] in order to maximize independence and safety with ADLs, return to IADLs and mobility.     Follow Up Recommendations  Home health OT;Supervision/Assistance - 24 hour    Equipment Recommendations  None recommended by OT    Recommendations for Other Services       Precautions / Restrictions Precautions Precautions: Fall Restrictions Weight Bearing Restrictions: No      Mobility Bed Mobility               General bed mobility comments: received in chair  Transfers Overall transfer level: Needs assistance Equipment used: None Transfers: Sit to/from Stand Sit to Stand:  Min guard         General transfer comment: min guard for safety, no physical assist required    Balance Overall balance assessment: Needs assistance Sitting-balance support: Feet supported Sitting balance-Leahy Scale: Good     Standing balance support: During functional activity;No upper extremity supported Standing balance-Leahy Scale: Fair Standing balance comment: preference to UE support when fatigued                           ADL either performed or assessed with clinical judgement   ADL Overall ADL's : Needs assistance/impaired     Grooming: Min guard;Standing   Upper Body Bathing: Set up;Sitting   Lower Body Bathing: Min guard;Sit to/from stand   Upper Body Dressing : Set up;Sitting   Lower Body Dressing: Min guard;Sit to/from stand Lower Body Dressing Details (indicate cue type and reason): increased effort to manage socks, used figure 4 technique seated; min guard sit<>stand  Toilet Transfer: Min guard;Ambulation Toilet Transfer Details (indicate cue type and reason): used RW when fatigued Toileting- Water quality scientist and Hygiene: Min guard;Sit to/from stand       Functional mobility during ADLs: Min guard;Cueing for safety;Cueing for sequencing(RW or no AD ) General ADL Comments: pt limited by safety, cognition, activity tolerance     Vision Baseline Vision/History: Wears glasses Wears Glasses: At all times Patient Visual Report: No change from baseline Vision Assessment?: No apparent visual deficits     Perception     Praxis      Pertinent Vitals/Pain Pain Assessment: No/denies pain     Hand Dominance Right   Extremity/Trunk Assessment  Upper Extremity Assessment Upper Extremity Assessment: Generalized weakness   Lower Extremity Assessment Lower Extremity Assessment: Defer to PT evaluation   Cervical / Trunk Assessment Cervical / Trunk Assessment: Other exceptions Cervical / Trunk Exceptions: increased body habitus    Communication Communication Communication: HOH   Cognition Arousal/Alertness: Awake/alert Behavior During Therapy: WFL for tasks assessed/performed Overall Cognitive Status: Impaired/Different from baseline Area of Impairment: Awareness;Problem solving;Safety/judgement                         Safety/Judgement: Decreased awareness of safety;Decreased awareness of deficits Awareness: Emergent Problem Solving: Slow processing;Decreased initiation;Difficulty sequencing;Requires verbal cues General Comments: patient with poor awareness of deficits and need for assistance, difficulty counting and sequencing months backwards; noted hx of memory deficit at baseline; further functional cognitive testing recommended   General Comments  VSS, noted increased WOB with activity but O2 saturation on RA >95%; discussed home safety and recommendations for spouse to assist with IADLs and driving at this time     Exercises     Shoulder Instructions      Home Living Family/patient expects to be discharged to:: Private residence Living Arrangements: Spouse/significant other Available Help at Discharge: Family Type of Home: House Home Access: Stairs to enter Technical brewer of Steps: 1 Entrance Stairs-Rails: Right Home Layout: One level     Bathroom Shower/Tub: Occupational psychologist: Handicapped height Bathroom Accessibility: Yes   Home Equipment: Clinical cytogeneticist - 2 wheels;Cane - single point;Grab bars - toilet;Grab bars - tub/shower          Prior Functioning/Environment Level of Independence: Independent with assistive device(s)        Comments: driving, independent ADLs         OT Problem List: Decreased activity tolerance;Impaired balance (sitting and/or standing);Decreased cognition;Decreased safety awareness;Decreased knowledge of use of DME or AE;Obesity      OT Treatment/Interventions: Self-care/ADL training;Therapeutic exercise;DME and/or AE  instruction;Therapeutic activities;Cognitive remediation/compensation;Balance training;Patient/family education    OT Goals(Current goals can be found in the care plan section) Acute Rehab OT Goals Patient Stated Goal: to go home OT Goal Formulation: With patient Time For Goal Achievement: 01/20/19 Potential to Achieve Goals: Good  OT Frequency: Min 2X/week   Barriers to D/C:            Co-evaluation              AM-PAC OT "6 Clicks" Daily Activity     Outcome Measure Help from another person eating meals?: A Little Help from another person taking care of personal grooming?: A Little Help from another person toileting, which includes using toliet, bedpan, or urinal?: A Little Help from another person bathing (including washing, rinsing, drying)?: A Little Help from another person to put on and taking off regular upper body clothing?: A Little Help from another person to put on and taking off regular lower body clothing?: A Little 6 Click Score: 18   End of Session Equipment Utilized During Treatment: Gait belt;Rolling walker Nurse Communication: Mobility status  Activity Tolerance: Patient tolerated treatment well Patient left: in chair;with call bell/phone within reach  OT Visit Diagnosis: Other abnormalities of gait and mobility (R26.89);Other symptoms and signs involving cognitive function                Time: 1357-1420 OT Time Calculation (min): 23 min Charges:  OT General Charges $OT Visit: 1 Visit OT Evaluation $OT Eval Moderate Complexity: 1 Mod  Delight Stare, OT Acute  Rehabilitation Services Pager [phone] Office [phone]   Delight Stare 01/06/2019, 2:47 PM

## 2019-01-06 NOTE — Progress Notes (Signed)
VASCULAR LAB PRELIMINARY  PRELIMINARY  PRELIMINARY  PRELIMINARY  Carotid duplex completed.    Preliminary report:  See CV proc for preliminary results.   Lenna Hagarty, RVT 01/06/2019, 1:16 PM

## 2019-01-06 NOTE — Progress Notes (Signed)
Pt is confused.  RN in agreement that is not safe to place pt on CPAP.  Pt connected to Tele monitor.  RN will monitor SP02 and will call RT if needed.

## 2019-01-06 NOTE — Progress Notes (Signed)
STROKE TEAM PROGRESS NOTE   INTERVAL HISTORY His vascular technologist is at the bedside.  Pt awake alert and orientated. No neuro deficit. Pending MRI and MRA. CUS negative. TTE pending.   OBJECTIVE Vitals:   01/05/19 2334 01/06/19 0044 01/06/19 0121 01/06/19 0327  BP: (!) 185/56 (!) 185/56 (!) 190/55 (!) 163/71  Pulse: (!) 59 67  60  Resp: 20 17  18   Temp: 98.5 F (36.9 C)  99.6 F (37.6 C) 97.8 F (36.6 C)  TempSrc: Oral  Oral Oral  SpO2: 100% 97%  100%  Weight:      Height:        CBC:  Recent Labs  Lab 01/05/19 1434  WBC 11.2*  NEUTROABS 8.3*  HGB 11.6*  HCT 37.0*  MCV 94.4  PLT 782    Basic Metabolic Panel:  Recent Labs  Lab 01/05/19 1434  NA 139  K 4.4  CL 107  CO2 22  GLUCOSE 189*  BUN 43*  CREATININE 1.85*  CALCIUM 9.3    Lipid Panel:     Component Value Date/Time   CHOL 138 01/06/2019 0558   TRIG 210 (H) 01/06/2019 0558   HDL 23 (L) 01/06/2019 0558   CHOLHDL 6.0 01/06/2019 0558   VLDL 42 (H) 01/06/2019 0558   LDLCALC 73 01/06/2019 0558   HgbA1c:  Lab Results  Component Value Date   HGBA1C 7.4 (H) 01/06/2019   Urine Drug Screen:     Component Value Date/Time   LABOPIA NONE DETECTED 01/05/2019 1416   COCAINSCRNUR NONE DETECTED 01/05/2019 1416   LABBENZ NONE DETECTED 01/05/2019 1416   AMPHETMU NONE DETECTED 01/05/2019 1416   THCU NONE DETECTED 01/05/2019 1416   LABBARB NONE DETECTED 01/05/2019 1416    Alcohol Level     Component Value Date/Time   ETH <10 01/05/2019 1434    IMAGING  Dg Chest Portable 1 View 01/05/2019 IMPRESSION:  Cardiomegaly without acute cardiopulmonary findings.  Ct Head Code Stroke Wo Contrast 01/05/2019 IMPRESSION:  1. No acute finding.  ASPECTS is 10.  2. Atrophy and chronic small vessel ischemia.   Transthoracic Echocardiogram  00/00/2020 Pending  Vas US Carotid (at [place] Only)  Result Date: 01/06/2019 Carotid Arterial Duplex Study Indications:       CVA, confusion. Risk Factors:       Hypertension, hyperlipidemia, Diabetes, coronary artery                    disease, prior CVA. Other Factors:     Atrial fibrillation, pacemaker, CHF. Limitations        Today's exam was limited due to the body habitus of the                    patient and confusion. Comparison Study:  Prior study from 08/31/17 is available Performing Technologist: Sharion Dove RVS  Examination Guidelines: A complete evaluation includes B-mode imaging, spectral Doppler, color Doppler, and power Doppler as needed of all accessible portions of each vessel. Bilateral testing is considered an integral part of a complete examination. Limited examinations for reoccurring indications may be performed as noted.  Right Carotid Findings: +----------+--------+--------+--------+------------------+------------------+           PSV cm/sEDV cm/sStenosisPlaque DescriptionComments           +----------+--------+--------+--------+------------------+------------------+ CCA Prox  147     23  intimal thickening +----------+--------+--------+--------+------------------+------------------+ CCA Distal51      9                                 intimal thickening +----------+--------+--------+--------+------------------+------------------+ ICA Distal105     42                                                   +----------+--------+--------+--------+------------------+------------------+ ECA       112     6                                                    +----------+--------+--------+--------+------------------+------------------+ +----------+--------+-------+--------+-------------------+           PSV cm/sEDV cmsDescribeArm Pressure (mmHG) +----------+--------+-------+--------+-------------------+ [id number]                                         +----------+--------+-------+--------+-------------------+ +---------+--------+--+--------+-+ VertebralPSV cm/s63EDV cm/s4  +---------+--------+--+--------+-+   Left Carotid Findings: +----------+--------+--------+--------+------------------+--------+           PSV cm/sEDV cm/sStenosisPlaque DescriptionComments +----------+--------+--------+--------+------------------+--------+ CCA Prox  112     18                                         +----------+--------+--------+--------+------------------+--------+ CCA Distal58      22                                         +----------+--------+--------+--------+------------------+--------+ ICA Prox  114     23                                         +----------+--------+--------+--------+------------------+--------+ ICA Distal92      27                                         +----------+--------+--------+--------+------------------+--------+ ECA       120     18                                         +----------+--------+--------+--------+------------------+--------+ +----------+--------+--------+------------+-------------------+ SubclavianPSV cm/sEDV cm/sDescribe    Arm Pressure (mmHG) +----------+--------+--------+------------+-------------------+                           Not assessed                    +----------+--------+--------+------------+-------------------+ +---------+--------+---+--------+--+ VertebralPSV cm/s130EDV cm/s21 +---------+--------+---+--------+--+   Summary: Right Carotid: The extracranial vessels were near-normal with only minimal wall  thickening or plaque. Left Carotid: The extracranial vessels were near-normal with only minimal wall               thickening or plaque. Vertebrals:  Bilateral vertebral arteries demonstrate antegrade flow. 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 Normal flow hemodynamics              were seen in the right subclavian artery. *See table(s) above for measurements and observations.     Preliminary    EEG This study is suggestive of mild diffuse  encephalopathy. No seizures or epileptiform discharges were seen throughout the recording.   PHYSICAL EXAM  Temp:  [97.8 F (36.6 C)-99.6 F (37.6 C)] 98.2 F (36.8 C) (08/23 1600) Pulse Rate:  [59-69] 60 (08/23 0327) Resp:  [17-20] 18 (08/23 1600) BP: (149-193)/(45-88) 156/88 (08/23 1600) SpO2:  [95 %-100 %] 100 % (08/23 0327)  General - Well nourished, well developed, in no apparent distress.  Ophthalmologic - fundi not visualized due to noncooperation.  Cardiovascular - Regular rate and rhythm.  Mental Status -  Level of arousal and orientation to month, place, and person were intact, not to year. Language including expression, naming, repetition, comprehension was assessed and found intact.  Cranial Nerves II - XII - II - Visual field intact OU. III, IV, VI - Extraocular movements intact. V - Facial sensation intact bilaterally. VII - Facial movement intact bilaterally. VIII - Hearing & vestibular intact bilaterally. X - Palate elevates symmetrically. XI - Chin turning & shoulder shrug intact bilaterally. XII - Tongue protrusion intact.  Motor Strength - The patient's strength was normal in all extremities and pronator drift was absent.  Bulk was normal and fasciculations were absent.   Motor Tone - Muscle tone was assessed at the neck and appendages and was normal.  Reflexes - The patient's reflexes were symmetrical in all extremities and he had no pathological reflexes.  Sensory - Light touch, temperature/pinprick were assessed and were symmetrical.    Coordination - The patient had normal movements in the hands with no ataxia or dysmetria.  Tremor was absent.  Gait and Station - deferred.   ASSESSMENT/PLAN [name] is a 78 y.o. male with history of atrial fibrillation, previous strokes (residual Lt sided weakness, memory problems and facial droop), OSA ( cpap), SVA ( 08/2017), CKD, CHF, DM2, pacemaker, PVD, HLD, CAD (non obstructive) presenting with  aphasia and increased confusion. He did not receive IV t-PA due to late presentation (>4.5 hours from time of onset).  Likely hypertensive encephalopathy, pending MRI to rule out stroke  Code Stroke CT Head - No acute finding.    MRI head - pending  MRA head - pending  If pt not able to tolerate MRI, please consider to repeat CT head without contrast  EEG - mild encephalopathy, no seizure  Carotid Doppler unremarkable  2D Echo - pending  Pacemaker interrogation pending  Novel Coronavirus - pending  LDL - 73  HgbA1c - 7.4  UDS - negative  VTE prophylaxis - Lovenox  clopidogrel 75 mg daily prior to admission, now on clopidogrel 75 mg daily  Patient counseled to be compliant with his antithrombotic medications  Ongoing aggressive stroke risk factor management  Therapy recommendations:  pending  Disposition:  Pending  ?? Hx of Afib  As per problem list, pt had hx of AFib with RVR  However, no documented event in note stating afib  Pacemaker interrogation pending  On plavix PTA  Follows with Dr. Caryl Comes at  [facility]  Hx of stroke  Admitted in 08/2017 for left-sided weakness.  MRI showed 2 small right frontal cortical infarcts.  Patient not cooperative with MRA and carotid Doppler, however, right ICA no stenosis.  EF 60 to 65% and TEE showed no PFO.  LDL 106 and A1c 7.6.  He was put on aspirin 325 and Plavix 75 3 weeks and then Plavix alone as well as Lipitor 10.    He follow with Dr. Leonie Man at [facility], on Plavix PTA.  Hypertensive urgency  Home BP meds: Coreg 25 mg Bid and Hydralazine 100 mg Tid  Current BP meds: Coreg 12.5 mg Bid  Blood pressure somewhat high at times but within post stroke/TIA parameters . Permissive hypertension (OK if < 220/120) but gradually normalize in 3-5 days  . Long-term BP goal normotensive  Hyperlipidemia  Home Lipid lowering medication: Lipitor 10 mg daily   LDL 73, goal < 70  Current lipid lowering medication:  Lipitor 10 mg daily   Continue statin at discharge  Diabetes  Home diabetic meds: insulin pump  Current diabetic meds: insulin pump  HgbA1c 7.4, goal < 7.0  SSI  CBG monitoring  Follows with Dr. Dwyane Dee as outpt  Other Stroke Risk Factors  Advanced age  Former cigarette smoker - quit  ETOH use, advised to drink no more than 1 alcoholic beverage per day.  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 After hours, contact General Neurology

## 2019-01-06 NOTE — Progress Notes (Signed)
Received the pt from the ED, alert and oriented x4, placed on Tele and verified, implemented MD orders, Pt has Pacemaker and Insulin pump, signed insulin pump contract,  Personal items within reach

## 2019-01-06 NOTE — Procedures (Signed)
Patient Name: David [name]  MRN: [id number]  Epilepsy Attending: Lora Havens  Referring Physician/Provider: Dr Gilles Chiquito Date: 01/06/2019 Duration: 2  Patient history: 78yo M with transient speech disturbance. EEG to evaluate for seizures.   Level of alertness: awake, drowsy  AEDs during EEG study: None  Technical aspects: This EEG study was done with scalp electrodes positioned according to the 10-20 International system of electrode placement. Electrical activity was acquired at a sampling rate of 500Hz  and reviewed with a high frequency filter of 70Hz  and a low frequency filter of 1Hz . EEG data were recorded continuously and digitally stored.   DESCRIPTION: The posterior dominant rhythm consists of 7-8 Hz activity of moderate voltage (25-35 uV) seen predominantly in posterior head regions, symmetric and reactive to eye opening and eye closing. Drowsiness was characterized by attenuation of the posterior background rhythm. There is also continuous generalized 5-6Hz  theta slowing.  Hyperventilation and photic stimulation were not performed.  IMPRESSION:  This study is suggestive of mild diffuse encephalopathy. No seizures or epileptiform discharges were seen throughout the recording.  However, only wakefulness and drowsiness were recorded. If suspicion for interictal activity remains a concern, a prolonged study including sleep should be considered.   David [name]

## 2019-01-06 NOTE — Evaluation (Signed)
Physical Therapy Evaluation Patient Details Name: David [name] MRN: [id number] DOB: 11/11/1940 Today's Date: 01/06/2019   History of Present Illness  78 y.o. male with medical history significant of previous CVA, Sinus node dysfunction with pacemaker in place, IDDM with insulin pump in place, OSA, CKD, CAD, BPH, Afib with RVR, HLD who presented for acute neurological changes.    Clinical Impression  Orders received for PT evaluation. Patient demonstrates deficits in functional mobility as indicated below. Will benefit from continued skilled PT to address deficits and maximize function. Will see as indicated and progress as tolerated.  Recommend HHPT follow up for balance deficits and endurance issues.      Follow Up Recommendations Home health PT;Supervision - Intermittent    Equipment Recommendations  None recommended by PT    Recommendations for Other Services       Precautions / Restrictions Precautions Precautions: Fall Precaution Comments: watch breathing      Mobility  Bed Mobility               General bed mobility comments: received in chair  Transfers Overall transfer level: Needs assistance Equipment used: None Transfers: Sit to/from Stand Sit to Stand: Min guard         General transfer comment: min guard for safety, no physical assist required  Ambulation/Gait Ambulation/Gait assistance: Min guard;Min assist Gait Distance (Feet): 180 Feet Assistive device: None Gait Pattern/deviations: Step-through pattern;Decreased stride length;Drifts right/left;Staggering right;Wide base of support Gait velocity: decreased   General Gait Details: patient with noted instability, worsens with fatigue. Significnt DOE but saturations stable >96%. able to self correct balance with multiple LOB by reaching out for rails or furniture.  Stairs            Wheelchair Mobility    Modified Rankin (Stroke Patients Only) Modified Rankin (Stroke Patients  Only) Pre-Morbid Rankin Score: Slight disability Modified Rankin: Moderate disability     Balance Overall balance assessment: Needs assistance Sitting-balance support: Feet supported Sitting balance-Leahy Scale: Good     Standing balance support: During functional activity;No upper extremity supported Standing balance-Leahy Scale: Fair                   Standardized Balance Assessment Standardized Balance Assessment : Dynamic Gait Index   Dynamic Gait Index Level Surface: Mild Impairment Change in Gait Speed: Moderate Impairment Gait with Horizontal Head Turns: Moderate Impairment Gait with Vertical Head Turns: Mild Impairment Gait and Pivot Turn: Moderate Impairment Step Over Obstacle: Moderate Impairment Step Around Obstacles: Moderate Impairment       Pertinent Vitals/Pain Pain Assessment: No/denies pain    Home Living Family/patient expects to be discharged to:: Private residence Living Arrangements: Spouse/significant other Available Help at Discharge: Family Type of Home: House Home Access: Stairs to enter Entrance Stairs-Rails: Right Entrance Stairs-Number of Steps: 1 Home Layout: One level Home Equipment: Clinical cytogeneticist - 2 wheels;Cane - single point      Prior Function Level of Independence: Independent with assistive device(s)               Hand Dominance   Dominant Hand: Right    Extremity/Trunk Assessment   Upper Extremity Assessment Upper Extremity Assessment: Generalized weakness    Lower Extremity Assessment Lower Extremity Assessment: Generalized weakness;RLE deficits/detail;LLE deficits/detail(some coordination deficits that worsen with fatigue) RLE Sensation: history of peripheral neuropathy RLE Coordination: decreased fine motor;decreased gross motor LLE Sensation: history of peripheral neuropathy LLE Coordination: decreased fine motor;decreased gross motor    Cervical / Trunk Assessment  Cervical / Trunk Assessment:  (increased body habitus)  Communication   Communication: HOH  Cognition Arousal/Alertness: Awake/alert Behavior During Therapy: WFL for tasks assessed/performed Overall Cognitive Status: Within Functional Limits for tasks assessed Area of Impairment: Awareness;Problem solving;Safety/judgement                         Safety/Judgement: Decreased awareness of deficits Awareness: Emergent Problem Solving: Slow processing        General Comments      Exercises     Assessment/Plan    PT Assessment Patient needs continued PT services  PT Problem List         PT Treatment Interventions DME instruction;Gait training;Stair training;Functional mobility training;Therapeutic activities;Therapeutic exercise;Balance training;Neuromuscular re-education;Patient/family education    PT Goals (Current goals can be found in the Care Plan section)  Acute Rehab PT Goals Patient Stated Goal: to go home PT Goal Formulation: With patient Time For Goal Achievement: 01/20/19 Potential to Achieve Goals: Good    Frequency Min 3X/week   Barriers to discharge        Co-evaluation               AM-PAC PT "6 Clicks" Mobility  Outcome Measure Help needed turning from your back to your side while in a flat bed without using bedrails?: A Lot Help needed moving from lying on your back to sitting on the side of a flat bed without using bedrails?: A Little Help needed moving to and from a bed to a chair (including a wheelchair)?: A Little Help needed standing up from a chair using your arms (e.g., wheelchair or bedside chair)?: A Little Help needed to walk in hospital room?: A Little Help needed climbing 3-5 steps with a railing? : A Lot 6 Click Score: 16    End of Session   Activity Tolerance: Patient tolerated treatment well;Patient limited by fatigue Patient left: in chair;with call bell/phone within reach;with chair alarm set Nurse Communication: Mobility status PT Visit  Diagnosis: Unsteadiness on feet (R26.81);Difficulty in walking, not elsewhere classified (R26.2);Other symptoms and signs involving the nervous system (R29.898)    Time: 2774-1287 PT Time Calculation (min) (ACUTE ONLY): 22 min   Charges:   PT Evaluation $PT Eval Moderate Complexity: 1 Mod          David [name], PT DPT  Board Certified Neurologic Specialist Acute Rehabilitation Services Pager [phone] Office [phone]   David [name] 01/06/2019, 9:48 AM

## 2019-01-06 NOTE — Progress Notes (Signed)
Instructed by MRI department to page MD to contact [employer] Rep. On call. For the MRI ordered.

## 2019-01-06 NOTE — Progress Notes (Signed)
EEG completed, results pending. 

## 2019-01-06 NOTE — TOC Initial Note (Signed)
Transition of Care [facility]) - Initial/Assessment Note    Patient Details  Name: David [name] MRN: [id number] Date of Birth: Dec 13, 1940  [facility] CM/SW Contact:    Carles Collet, RN Phone Number: 01/06/2019, 3:00 PM  Clinical Narrative:     Spoke w patient at bedside. He states that he lives at home alone. He is agreeable to PT OT recs for [facility] services. Has Medicare list at bedside for review. CM will follow up for [facility] choice.               Expected Discharge Plan: [place] Barriers to Discharge: Continued Medical Work up   Patient Goals and CMS Choice Patient states their goals for this hospitalization and ongoing recovery are:: to return home CMS Medicare.gov Compare Post Acute Care list provided to:: Patient Choice offered to / list presented to : Patient  Expected Discharge Plan and Services Expected Discharge Plan: [place]   Discharge Planning Services: CM Consult                                          Prior Living Arrangements/Services   Lives with:: Self                   Activities of Daily Living Home Assistive Devices/Equipment: CPAP ADL Screening (condition at time of admission) Patient's cognitive ability adequate to safely complete daily activities?: Yes Is the patient deaf or have difficulty hearing?: No Does the patient have difficulty seeing, even when wearing glasses/contacts?: No Does the patient have difficulty concentrating, remembering, or making decisions?: No Patient able to express need for assistance with ADLs?: Yes Does the patient have difficulty dressing or bathing?: No Independently performs ADLs?: Yes (appropriate for developmental age) Does the patient have difficulty walking or climbing stairs?: Yes Weakness of Legs: Both Weakness of Arms/Hands: Both  Permission Sought/Granted                  Emotional Assessment              Admission diagnosis:   Altered mental status, unspecified altered mental status type [R41.82] Patient Active Problem List   Diagnosis Date Noted  . Encephalopathy acute 01/05/2019  . Memory change 11/19/2018  . 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 CKD (chronic kidney disease) stage 4, GFR 15-29 ml/min (HCC) 09/26/2017  . 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 Nonobstructive CAD s/p PCI 2012   . Cardiac pacemaker in situ   . PVD (peripheral vascular disease) ([place])   . Anxiety 03/22/2017  . Acute cholecystitis s/p lap cholecystectomy 03/05/2017 03/04/2017  . 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 Fatigue 01/27/2017  . Low vitamin B12 level 01/27/2017  . OSA on CPAP 10/26/2014  . Seasonal and perennial allergic rhinitis 10/25/2013  . Hypertension associated with diabetes ([place]) 10/25/2013  . Hyperlipidemia associated with type 2 diabetes mellitus ([place]) 10/25/2013   PCP:  [name], MD Pharmacy:   [place], [name] - [address] [name] [address] Phone: [phone] Fax: [phone]     Social Determinants of Health (SDOH) Interventions    Readmission Risk Interventions No flowsheet data found.

## 2019-01-06 NOTE — Progress Notes (Signed)
PROGRESS NOTE    David [name]  ZOX:[id number] DOB: 04-05-1941 DOA: 01/05/2019 PCP: [name], MD   Brief Narrative:  David [name] is a 78 y.o. male with medical history significant of previous CVA, Sinus node dysfunction with pacemaker in place, IDDM with insulin pump in place, OSA, CKD, CAD, BPH, Afib with RVR, HLD who presented for acute neurological changes.  Patient did not remember much of the history. His son reported that patient was in his normal state of health the morning of 01/05/2019.  At 1100, he began to be confused and was unable to identify family members.  He had waxing and waning garbled speech and some increased weakness. He notes only some increased SOB recently and inability to lie flat.  His Cardiologist, Dr. Jens Som, increased his torsemide for this issue yesterday.    ED Course: In the ED, he had blood work which showed elevated glucose, elevated BUN/Cr, but in range of his usual.  BNP was 429 - no previous in system.  Most recently LDL from March was 4.  WBC was 11.2 with a neutrophil predominance.  CXR showed cardiomegaly with no other pulmonary issues.  H/H were 11.6 and 37.  MCV was 94.  SARS-CoV2 is pending.    He was admitted under hospitalist service with neurology consulted.  CT head code stroke was unremarkable.  MRI brain and MR angios head and neck still pending.   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  He is completely alert and oriented.  Blood pressure much better than yesterday.  Continue current management.  MRI and MRA still pending.  Waiting for [employer] representative to come to the hospital.  If he is unable to perform that due to his orthopnea then we will go ahead with CT repeat of the head since CT angiogram could not be done due to CKD.  Continue aspirin and Plavix.  He is back to baseline.  Daughter at the bedside.  She confirmed this.  Type 2 diabetes mellitus: Blood sugar slightly elevated.  Insulin pump on hold.  We will start him on SSI for now.  Nonobstructive CAD status post PCI in 2012/hyperlipidemia: No chest pain or any signs of ACS.  Continue statin.  Atrial fibrillation: Pacemaker in place.  Continue Coreg.  He is not on any anticoagulation.  Chronic heart failure with preserved ejection fraction: Euvolemic.  +1 pitting edema bilateral lower extremity.  Received a dose of Lasix in the ER.  Continue torsemide.  OSA on CPAP: Continue CPAP.  CKD stage IV: Baseline.  Continue to watch.  DVT prophylaxis: Lovenox Code Status: Full code Family Communication: Daughter at bedside.  Discussed in length with her and the patient.  Disposition Plan: TBD.  Waiting for further work-up/imaging studies to be completed.  Consultants:   Neurology  Procedures:   None  Antimicrobials:   None   Subjective: Patient seen and examined.  Daughter at  the bedside.  He has no complaints.  He is completely alert and oriented.  Speech is fluent.  Objective: Vitals:   01/05/19 2334 01/06/19 0044 01/06/19 0121 01/06/19 0327  BP: (!) 185/56 (!) 185/56 (!) 190/55 (!) 163/71  Pulse: (!) 59 67  60  Resp: 20 17  18   Temp: 98.5 F (36.9 C)  99.6 F (37.6 C) 97.8 F (36.6 C)  TempSrc: Oral  Oral Oral  SpO2: 100% 97%  100%  Weight:      Height:        Intake/Output Summary (Last 24 hours) at 01/06/2019 1344 Last data filed at 01/06/2019  0328 Gross per 24 hour  Intake 120 ml  Output 200 ml  Net -80 ml   Filed Weights   01/05/19 1430  Weight: 131.5 kg    Examination:  General exam: Appears calm and comfortable  Respiratory system: Clear to auscultation. Respiratory effort normal. Cardiovascular system: S1 & S2 heard, RRR. No JVD, murmurs, rubs, gallops or clicks. No pedal edema. Gastrointestinal system: Abdomen is nondistended, soft and nontender. No organomegaly or masses felt. Normal bowel sounds heard. Central nervous system: Alert and oriented. No focal neurological deficits. Extremities: Symmetric 5 x 5 power. Skin: No rashes, lesions or ulcers Psychiatry: Judgement and insight appear normal. Mood & affect appropriate.    Data Reviewed: I have personally reviewed following labs and imaging studies  CBC: Recent Labs  Lab 01/05/19 1434  WBC 11.2*  NEUTROABS 8.3*  HGB 11.6*  HCT 37.0*  MCV 94.4  PLT 621   Basic Metabolic Panel: Recent Labs  Lab 01/05/19 1434  NA 139  K 4.4  CL 107  CO2 22  GLUCOSE 189*  BUN 43*  CREATININE 1.85*  CALCIUM 9.3   GFR: Estimated Creatinine Clearance: 48.7 mL/min (A) (by C-G formula based on SCr of 1.85 mg/dL (H)). Liver Function Tests: Recent Labs  Lab 01/05/19 1434  AST 27  ALT 29  ALKPHOS 85  BILITOT 0.7  PROT 7.0  ALBUMIN 3.7   No results for input(s): LIPASE, AMYLASE in the last 168 hours. No results for input(s): AMMONIA in the last 168 hours. Coagulation Profile: Recent Labs  Lab 01/05/19 1434  INR 1.0   Cardiac Enzymes: No results for input(s): CKTOTAL, CKMB, CKMBINDEX, TROPONINI in the last 168 hours. BNP (last 3 results) No results for input(s): PROBNP in the last 8760 hours. HbA1C: Recent Labs    01/06/19 0558  HGBA1C 7.4*   CBG: Recent Labs  Lab 01/05/19 1449 01/05/19 2319 01/06/19 0433 01/06/19 0754 01/06/19 1200  GLUCAP 192* 141* 142* 240* 380*   Lipid Profile: Recent Labs    01/06/19 0558  CHOL 138  HDL 23*   LDLCALC 73  TRIG 210*  CHOLHDL 6.0   Thyroid Function Tests: No results for input(s): TSH, T4TOTAL, FREET4, T3FREE, THYROIDAB in the last 72 hours. Anemia Panel: No results for input(s): VITAMINB12, FOLATE, FERRITIN, TIBC, IRON, RETICCTPCT in the last 72 hours. Sepsis Labs: No results for input(s): PROCALCITON, LATICACIDVEN in the last 168 hours.  No results found for this or any previous visit (from the past 240 hour(s)).    Radiology Studies: Dg Chest Portable 1 View  Result Date: 01/05/2019 CLINICAL DATA:  Confusion.  Disorientation. EXAM: PORTABLE CHEST 1 VIEW COMPARISON:  09/02/2017 FINDINGS: 1647 hours. The lungs are clear without focal pneumonia, edema, pneumothorax or pleural effusion. The cardio pericardial silhouette is enlarged. Left permanent pacemaker again noted. The visualized bony structures of the thorax are intact.  Telemetry leads overlie the chest. IMPRESSION: Cardiomegaly without acute cardiopulmonary findings. Electronically Signed   By: Misty Stanley [name]   On: 01/05/2019 17:04   Ct Head Code Stroke Wo Contrast  Result Date: 01/05/2019 CLINICAL DATA:  Code stroke.  Expressive aphasia EXAM: CT HEAD WITHOUT CONTRAST TECHNIQUE: Contiguous axial images were obtained from the base of the skull through the vertex without intravenous contrast. COMPARISON:  08/30/2017 FINDINGS: Brain: No evidence of acute infarction, hemorrhage, hydrocephalus, extra-axial collection or mass lesion/mass effect. Small vessel ischemic gliosis in the deep cerebral white matter. Mild for age volume loss. Small calcifications along the right cerebral convexity, unchanged. These could be vascular or subarachnoid/inflammatory. Vascular: Atherosclerotic calcification.  No hyperdense vessel. Skull: No acute or aggressive finding Sinuses/Orbits: Bilateral cataract resection Other: These results were communicated to David [name] at 3:45 pmon 8/22/2020by text page via the [facility] messaging system. ASPECTS [facility] Stroke  Program Early CT Score) - Ganglionic level infarction (caudate, lentiform nuclei, internal capsule, insula, M1-M3 cortex): 7 - Supraganglionic infarction (M4-M6 cortex): 3 Total score (0-10 with 10 being normal): 10 IMPRESSION: 1. No acute finding.  ASPECTS is 10. 2. Atrophy and chronic small vessel ischemia. Electronically Signed   By: Monte Fantasia [name]   On: 01/05/2019 15:46   Vas US Carotid (at [place] Only)  Result Date: 01/06/2019 Carotid Arterial Duplex Study Indications:       CVA, confusion. Risk Factors:      Hypertension, hyperlipidemia, Diabetes, coronary artery                    disease, prior CVA. Other Factors:     Atrial fibrillation, pacemaker, CHF. Limitations        Today's exam was limited due to the body habitus of the                    patient and confusion. Comparison Study:  Prior study from 08/31/17 is available Performing Technologist: Sharion Dove RVS  Examination Guidelines: A complete evaluation includes B-mode imaging, spectral Doppler, color Doppler, and power Doppler as needed of all accessible portions of each vessel. Bilateral testing is considered an integral part of a complete examination. Limited examinations for reoccurring indications may be performed as noted.  Right Carotid Findings: +----------+--------+--------+--------+------------------+------------------+             PSV cm/s EDV cm/s Stenosis Plaque Description Comments            +----------+--------+--------+--------+------------------+------------------+  CCA Prox   147      23                                   intimal thickening  +----------+--------+--------+--------+------------------+------------------+  CCA Distal 51       9                                    intimal thickening  +----------+--------+--------+--------+------------------+------------------+  ICA Distal 105      42                                                        +----------+--------+--------+--------+------------------+------------------+  ECA        112  6                                                        +----------+--------+--------+--------+------------------+------------------+ +----------+--------+-------+--------+-------------------+             PSV cm/s EDV cms Describe Arm Pressure (mmHG)  +----------+--------+-------+--------+-------------------+  Subclavian 95                                             +----------+--------+-------+--------+-------------------+ +---------+--------+--+--------+-+  Vertebral PSV cm/s 63 EDV cm/s 4  +---------+--------+--+--------+-+   Left Carotid Findings: +----------+--------+--------+--------+------------------+--------+             PSV cm/s EDV cm/s Stenosis Plaque Description Comments  +----------+--------+--------+--------+------------------+--------+  CCA Prox   112      18                                             +----------+--------+--------+--------+------------------+--------+  CCA Distal 58       22                                             +----------+--------+--------+--------+------------------+--------+  ICA Prox   114      23                                             +----------+--------+--------+--------+------------------+--------+  ICA Distal 92       27                                             +----------+--------+--------+--------+------------------+--------+  ECA        120      18                                             +----------+--------+--------+--------+------------------+--------+ +----------+--------+--------+------------+-------------------+  Subclavian PSV cm/s EDV cm/s Describe     Arm Pressure (mmHG)  +----------+--------+--------+------------+-------------------+                               Not assessed                      +----------+--------+--------+------------+-------------------+ +---------+--------+---+--------+--+  Vertebral PSV cm/s 130 EDV cm/s 21   +---------+--------+---+--------+--+   Summary: Right Carotid: The extracranial vessels were near-normal with only minimal wall                thickening or plaque. Left Carotid: The extracranial vessels were near-normal with only minimal wall               thickening or plaque. Vertebrals:  Bilateral  vertebral arteries demonstrate antegrade flow. 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 Normal flow hemodynamics              were seen in the right subclavian artery. *See table(s) above for measurements and observations.     Preliminary     Scheduled Meds:  atorvastatin  10 mg Oral q1800   calcitRIOL  0.25 mcg Oral Daily   carvedilol  12.5 mg Oral BID WC   cholecalciferol  1,000 Units Oral Daily   clopidogrel  75 mg Oral Daily   insulin aspart  0-20 Units Subcutaneous Q4H   insulin pump   Subcutaneous Q4H   pantoprazole  40 mg Oral Daily   sertraline  100 mg Oral Daily   torsemide  20 mg Oral BID   vitamin B-12  100 mcg Oral Daily   Continuous Infusions:   LOS: 0 days   Time spent: 36 minutes   Darliss Cheney, MD [facility] Pager [phone]  If 7PM-7AM, please contact night-coverage [email or web address] Password [email or web address] 01/06/2019, 1:44 PM

## 2019-01-07 ENCOUNTER — Observation Stay (HOSPITAL_COMMUNITY): Payer: Medicare Other

## 2019-01-07 ENCOUNTER — Ambulatory Visit: Payer: Medicare Other | Admitting: Family Medicine

## 2019-01-07 ENCOUNTER — Encounter: Payer: Self-pay | Admitting: Family Medicine

## 2019-01-07 DIAGNOSIS — I5032 Chronic diastolic (congestive) heart failure: Secondary | ICD-10-CM | POA: Diagnosis present

## 2019-01-07 DIAGNOSIS — E1122 Type 2 diabetes mellitus with diabetic chronic kidney disease: Secondary | ICD-10-CM | POA: Diagnosis present

## 2019-01-07 DIAGNOSIS — N2581 Secondary hyperparathyroidism of renal origin: Secondary | ICD-10-CM | POA: Diagnosis present

## 2019-01-07 DIAGNOSIS — I130 Hypertensive heart and chronic kidney disease with heart failure and stage 1 through stage 4 chronic kidney disease, or unspecified chronic kidney disease: Secondary | ICD-10-CM | POA: Diagnosis present

## 2019-01-07 DIAGNOSIS — E785 Hyperlipidemia, unspecified: Secondary | ICD-10-CM | POA: Diagnosis present

## 2019-01-07 DIAGNOSIS — I69354 Hemiplegia and hemiparesis following cerebral infarction affecting left non-dominant side: Secondary | ICD-10-CM | POA: Diagnosis not present

## 2019-01-07 DIAGNOSIS — G9340 Encephalopathy, unspecified: Secondary | ICD-10-CM | POA: Diagnosis present

## 2019-01-07 DIAGNOSIS — G459 Transient cerebral ischemic attack, unspecified: Secondary | ICD-10-CM | POA: Diagnosis not present

## 2019-01-07 DIAGNOSIS — Z20828 Contact with and (suspected) exposure to other viral communicable diseases: Secondary | ICD-10-CM | POA: Diagnosis present

## 2019-01-07 DIAGNOSIS — D631 Anemia in chronic kidney disease: Secondary | ICD-10-CM | POA: Diagnosis present

## 2019-01-07 DIAGNOSIS — N400 Enlarged prostate without lower urinary tract symptoms: Secondary | ICD-10-CM | POA: Diagnosis present

## 2019-01-07 DIAGNOSIS — I674 Hypertensive encephalopathy: Secondary | ICD-10-CM | POA: Diagnosis present

## 2019-01-07 DIAGNOSIS — E1142 Type 2 diabetes mellitus with diabetic polyneuropathy: Secondary | ICD-10-CM | POA: Diagnosis present

## 2019-01-07 DIAGNOSIS — I69311 Memory deficit following cerebral infarction: Secondary | ICD-10-CM | POA: Diagnosis not present

## 2019-01-07 DIAGNOSIS — I152 Hypertension secondary to endocrine disorders: Secondary | ICD-10-CM | POA: Diagnosis present

## 2019-01-07 DIAGNOSIS — E1169 Type 2 diabetes mellitus with other specified complication: Secondary | ICD-10-CM | POA: Diagnosis present

## 2019-01-07 DIAGNOSIS — I4891 Unspecified atrial fibrillation: Secondary | ICD-10-CM | POA: Diagnosis present

## 2019-01-07 DIAGNOSIS — I421 Obstructive hypertrophic cardiomyopathy: Secondary | ICD-10-CM | POA: Diagnosis present

## 2019-01-07 DIAGNOSIS — I6522 Occlusion and stenosis of left carotid artery: Secondary | ICD-10-CM | POA: Diagnosis not present

## 2019-01-07 DIAGNOSIS — N184 Chronic kidney disease, stage 4 (severe): Secondary | ICD-10-CM | POA: Diagnosis not present

## 2019-01-07 DIAGNOSIS — E1165 Type 2 diabetes mellitus with hyperglycemia: Secondary | ICD-10-CM | POA: Diagnosis present

## 2019-01-07 DIAGNOSIS — E1151 Type 2 diabetes mellitus with diabetic peripheral angiopathy without gangrene: Secondary | ICD-10-CM | POA: Diagnosis present

## 2019-01-07 DIAGNOSIS — I161 Hypertensive emergency: Secondary | ICD-10-CM | POA: Diagnosis present

## 2019-01-07 DIAGNOSIS — R410 Disorientation, unspecified: Secondary | ICD-10-CM | POA: Diagnosis not present

## 2019-01-07 DIAGNOSIS — M6281 Muscle weakness (generalized): Secondary | ICD-10-CM | POA: Diagnosis not present

## 2019-01-07 DIAGNOSIS — R4182 Altered mental status, unspecified: Secondary | ICD-10-CM | POA: Diagnosis not present

## 2019-01-07 DIAGNOSIS — I69392 Facial weakness following cerebral infarction: Secondary | ICD-10-CM | POA: Diagnosis not present

## 2019-01-07 DIAGNOSIS — I2510 Atherosclerotic heart disease of native coronary artery without angina pectoris: Secondary | ICD-10-CM | POA: Diagnosis present

## 2019-01-07 DIAGNOSIS — T85694A Other mechanical complication of insulin pump, initial encounter: Secondary | ICD-10-CM | POA: Diagnosis not present

## 2019-01-07 DIAGNOSIS — G4733 Obstructive sleep apnea (adult) (pediatric): Secondary | ICD-10-CM | POA: Diagnosis present

## 2019-01-07 LAB — ECHOCARDIOGRAM COMPLETE
Height: 76 in
Weight: 4638.48 oz

## 2019-01-07 LAB — GLUCOSE, CAPILLARY
Glucose-Capillary: 266 mg/dL — ABNORMAL HIGH (ref 70–99)
Glucose-Capillary: 267 mg/dL — ABNORMAL HIGH (ref 70–99)
Glucose-Capillary: 280 mg/dL — ABNORMAL HIGH (ref 70–99)
Glucose-Capillary: 404 mg/dL — ABNORMAL HIGH (ref 70–99)

## 2019-01-07 MED ORDER — INSULIN GLARGINE 100 UNIT/ML ~~LOC~~ SOLN
15.0000 [IU] | Freq: Every day | SUBCUTANEOUS | Status: DC
  Administered 2019-01-07: 15 [IU] via SUBCUTANEOUS
  Filled 2019-01-07: qty 0.15

## 2019-01-07 MED ORDER — PERFLUTREN LIPID MICROSPHERE
INTRAVENOUS | Status: AC
  Filled 2019-01-07: qty 10

## 2019-01-07 MED ORDER — HALOPERIDOL LACTATE 5 MG/ML IJ SOLN
2.0000 mg | Freq: Four times a day (QID) | INTRAMUSCULAR | Status: DC | PRN
  Administered 2019-01-07: 2 mg via INTRAVENOUS

## 2019-01-07 MED ORDER — INSULIN ASPART 100 UNIT/ML ~~LOC~~ SOLN
17.0000 [IU] | Freq: Once | SUBCUTANEOUS | Status: AC
  Administered 2019-01-07: 17 [IU] via SUBCUTANEOUS

## 2019-01-07 MED ORDER — HALOPERIDOL LACTATE 5 MG/ML IJ SOLN
INTRAMUSCULAR | Status: AC
  Filled 2019-01-07: qty 1

## 2019-01-07 MED ORDER — PERFLUTREN LIPID MICROSPHERE
1.0000 mL | INTRAVENOUS | Status: DC | PRN
  Administered 2019-01-07: 2 mL via INTRAVENOUS
  Filled 2019-01-07: qty 10

## 2019-01-07 NOTE — TOC Transition Note (Signed)
Transition of Care [facility]) - CM/SW Discharge Note   Patient Details  Name: David [name] MRN: [id number] Date of Birth: 1940-11-17  [facility] of [facility]) CM/SW Contact:  Pollie Friar, RN Phone Number: 01/07/2019, 3:40 PM   Clinical Narrative:    Pt discharging home with [facility].  Son to provide transport home.   Final next level of care: [place] Barriers to Discharge: No Barriers Identified   Patient Goals and CMS Choice Patient states their goals for this hospitalization and ongoing recovery are:: to return home CMS Medicare.gov Compare Post Acute Care list provided to:: Patient Represenative (must comment) Choice offered to / list presented to : Adult Children  Discharge Placement                       Discharge Plan and Services   Discharge Planning Services: CM Consult Post Acute Care Choice: Home Health                    HH Arranged: PT, OT, Speech Therapy [place]: [place] Date [facility] Agency Contacted: 01/07/19   Representative spoke with at [place]: [place] ([place]) Interventions     Readmission Risk Interventions No flowsheet data found.

## 2019-01-07 NOTE — Discharge Instructions (Signed)

## 2019-01-07 NOTE — Progress Notes (Signed)
SLP Cancellation Note  Patient Details Name: David [name] MRN: [id number] DOB: [birth year]   Cancelled treatment:  Pt out of room for procedure during attempt to evaluate earlier today. Pt will need HH SLP in addition to PT/OT.  Lonny Eisen L. Tivis Ringer, [place] Office number [phone] Pager [phone]          David [name] 01/07/2019, 3:30 PM

## 2019-01-07 NOTE — TOC Initial Note (Addendum)
Transition of Care [facility]) - Initial/Assessment Note    Patient Details  Name: David [name] MRN: [id number] Date of Birth: 10/20/1940  Transition of Care [facility]) CM/SW Contact:    Pollie Friar, RN Phone Number: 01/07/2019, 1:28 PM  Clinical Narrative:                 Plan is for patient to d/c home with [facility] services. Son selected [place] [facility] [place] with [facility] accepted the referral. No DME needs per PT/OT. Son to provide transport home when pt is medically ready for d/c.   Expected Discharge Plan: [place] Barriers to Discharge: Continued Medical Work up   Patient Goals and CMS Choice Patient states their goals for this hospitalization and ongoing recovery are:: to return home CMS Medicare.gov Compare Post Acute Care list provided to:: Patient Represenative (must comment) Choice offered to / list presented to : Adult Children(son)  Expected Discharge Plan and Services Expected Discharge Plan: [place]   Discharge Planning Services: CM Consult Post Acute Care Choice: [place] arrangements for the past 2 months: [place]: PT, OT HH Agency: [place] Date [place]: 01/07/19   Representative spoke with at [place]: Tommi Rumps  Prior Living Arrangements/Services Living arrangements for the past 2 months: Single Family Home Lives with:: Spouse Patient language and need for interpreter reviewed:: Yes(no needs) Do you feel safe going back to the place where you live?: Yes      Need for Family Participation in Patient Care: Yes (Comment)(24 hour supervision) Care giver support system in place?: Yes (comment)(wife and son able to provide needed supervision)   Criminal Activity/Legal Involvement Pertinent to Current Situation/Hospitalization: No - Comment as needed  Activities of Daily Living Home Assistive Devices/Equipment: CPAP ADL Screening (condition at  time of admission) Patient's cognitive ability adequate to safely complete daily activities?: Yes Is the patient deaf or have difficulty hearing?: No Does the patient have difficulty seeing, even when wearing glasses/contacts?: No Does the patient have difficulty concentrating, remembering, or making decisions?: No Patient able to express need for assistance with ADLs?: Yes Does the patient have difficulty dressing or bathing?: No Independently performs ADLs?: Yes (appropriate for developmental age) Does the patient have difficulty walking or climbing stairs?: Yes Weakness of Legs: Both Weakness of Arms/Hands: Both  Permission Sought/Granted                  Emotional Assessment Appearance:: Appears stated age   Affect (typically observed): Accepting Orientation: : Oriented to Self, Oriented to Place   Psych Involvement: No (comment)  Admission diagnosis:  Altered mental status, unspecified altered mental status type [R41.82] Patient Active Problem List   Diagnosis Date Noted  . Acute encephalopathy 01/07/2019  . Encephalopathy acute 01/05/2019  . Memory change 11/19/2018  . Senile purpura ([place]) 11/27/2017  . CKD (chronic kidney disease) stage 4, GFR 15-29 ml/min (HCC) 09/26/2017  . 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 Nonobstructive CAD s/p PCI 2012   . Cardiac pacemaker in situ   . PVD (peripheral vascular disease) ([place])   . Anxiety 03/22/2017  . Acute cholecystitis s/p lap cholecystectomy 03/05/2017 03/04/2017  . 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 IDDM (insulin dependent diabetes mellitus) -  on insulin pump 03/04/2017  . Fatigue 01/27/2017  . Low vitamin B12 level 01/27/2017  . OSA on CPAP 10/26/2014  . Seasonal and perennial allergic rhinitis 10/25/2013  . Hypertension associated with diabetes ([place]) 10/25/2013  . Hyperlipidemia associated with type 2 diabetes mellitus ([place]) 10/25/2013    PCP:  [name], MD Pharmacy:   [place], [name] - [address] [place] [name] [address] Phone: [phone] Fax: [phone]     Social Determinants of Health (SDOH) Interventions    Readmission Risk Interventions No flowsheet data found.
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
## 2019-01-07 NOTE — Progress Notes (Signed)
  Echocardiogram 2D Echocardiogram was attempted but patient was in MRI.   Jennette Dubin 01/07/2019, 1:38 PM

## 2019-01-07 NOTE — Progress Notes (Signed)
Inpatient Diabetes Program Recommendations  AACE/ADA: New Consensus Statement on Inpatient Glycemic Control (2015)  Target Ranges:  Prepandial:   less than 140 mg/dL      Peak postprandial:   less than 180 mg/dL (1-2 hours)      Critically ill patients:  140 - 180 mg/dL   Lab Results  Component Value Date   GLUCAP 266 (H) 01/07/2019   HGBA1C 7.4 (H) 01/06/2019    Review of Glycemic Control  Diabetes history: DM2 Outpatient Diabetes medications: Insulin pump Current orders for Inpatient glycemic control: Novolog 0-15 units Q4H.  Medtronic 723 PUMP  regimen with U-500 insulin as follows: Carb ratio and 1: 10 Target 130-150  BASAL rates:Midnight = 0.55, 6 AM = 0.75, 11 AM = 0.95 and 9 PM = 0.65  Total basal = 18.5 units total U-500.   I/C ratio: 1:10, ISF: 50, target 150-150.  Active insulin 6 hours   Inpatient Diabetes Program Recommendations:     Lantus 35 units bid - to start now. Novolog 5 units tidwc for meal coverage insulin if pt eats > 50% meal.  Per RN, pt states insulin pump has been malfunctioning. Would keep on lanus/novolog until pt can see Dr. Dwyane Dee.   Will continue to follow. Secure text with RN.  Thank you. Lorenda Peck, RD, LDN, CDE Inpatient Diabetes Coordinator [phone]

## 2019-01-07 NOTE — Progress Notes (Signed)
  Echocardiogram 2D Echocardiogram has been performed.  Jennette Dubin 01/07/2019, 3:27 PM

## 2019-01-07 NOTE — Discharge Summary (Signed)
Physician Discharge Summary  David [name] PJK:[id number] DOB: [birth year] DOA: 01/05/2019  PCP: David Barrack, MD  Admit date: 01/05/2019 Discharge date: 01/07/2019  Admitted From: Home Disposition: Home  Recommendations for Outpatient Follow-up:  1. Follow up with PCP in 1-2 weeks 2. Please obtain BMP/CBC in one week 3. Please follow up on the following pending results:  Home Health: Yes Equipment/Devices: None  Discharge Condition: Stable CODE STATUS: Full code Diet recommendation: Cardiac/low-sodium  Subjective: Seen and examined.  Alert and oriented.  No complaints.  Brief/Interim Summary: David [name] a 78 y.o.malewith medical history significant ofprevious CVA, Sinus node dysfunction with pacemaker in place, IDDM with insulin pump in place, OSA, CKD, CAD, BPH, Afib with RVR, HLD who presented for acute neurological changes.  Patient did not remember much of the history.His son reported that patient was in his normal state of health the morning of 01/05/2019. At 1100, he began to be confused and was unable to identify family members. He had waxing and waning garbled speech and some increased weakness.He notes only some increased SOB recently and inability to lie flat. His Cardiologist, Dr. Jens [name], increased his torsemide for this issue yesterday.   ED Course:In the ED, he had blood work which showed elevated glucose, elevated BUN/Cr, but in range of his usual. BNP was 429 - no previous in system. Most recently LDL from March was 35. WBC was 11.2 with a neutrophil predominance. CXR showed cardiomegaly with no other pulmonary issues. H/H were 11.6 and 37. MCV was 94. SARS-CoV2 negative.  He was admitted under hospitalist service with neurology consulted.  CT head code stroke was unremarkable.  MRI brain and MR angios head and neck was also negative for any stroke.  He was seen by neurology.  He presumably has hypertensive encephalopathy which has resolved.  EEG was  also negative for any seizures.  He is alert and oriented and back to himself.  I discussed with his son David [name] on the phone and he is in agreement with the discharge plan.  Patient is being discharged in stable condition.  Neurology has also cleared him.  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)   CKD (chronic kidney disease) stage 4, GFR 15-29 ml/min (HCC)   Encephalopathy acute   Acute encephalopathy   Hypertensive encephalopathy    Discharge Instructions  Discharge Instructions    Discharge patient   Complete by: As directed    Discharge disposition: 06-Home-Health Care Svc   Discharge patient date: 01/07/2019     Allergies as of 01/07/2019      Reactions   Ativan [lorazepam] Anxiety   Pt gets more agitated   Adhesive [tape] Other (See Comments)   blisters      Medication List    TAKE these medications   acetaminophen 325 MG tablet Commonly known as: TYLENOL Take 1-2 tablets (325-650 mg total) by mouth every 4 (four) hours as needed for mild pain.   atorvastatin 10 MG tablet Commonly known as: LIPITOR TAKE 1 TABLET BY MOUTH ONCE DAILY AT  6  PM   calcitRIOL 0.25 MCG capsule Commonly known as: ROCALTROL Take  1 capsule by mouth once daily   carvedilol 25 MG tablet Commonly known as: COREG TAKE 1 TABLET BY MOUTH TWICE DAILY WITH A MEAL What changed: See the new instructions.   cholecalciferol 1000 units tablet Commonly known as: VITAMIN D Take 1 tablet (1,000 Units total) by mouth daily.   clopidogrel 75 MG tablet Commonly known as: PLAVIX Take 1 tablet by mouth once daily   diclofenac sodium 1 %  Gel Commonly known as: VOLTAREN Apply 2 g topically 4 (four) times daily.   hydrALAZINE 100 MG tablet Commonly known as: APRESOLINE Take 100 mg by mouth 3 (three) times daily. Take 1 tablet by mouth three times daily.   insulin regular human CONCENTRATED 500 UNIT/ML injection Commonly known as: HUMULIN R USE 0.25 ML DAILY OR AS DIRECTED IN INSULIN PUMP. DX:E11.65 What changed:   how much to take  how to take this  when to take this   isosorbide mononitrate 30 MG 24 hr tablet Commonly known as: IMDUR TAKE TWO TABLETS BY MOUTH ONCE DAILY   pantoprazole 40 MG tablet Commonly known as: PROTONIX TAKE ONE TABLET BY MOUTH ONCE DAILY   sertraline 100 MG tablet Commonly known as: ZOLOFT Take 100 mg by mouth daily.   torsemide 20 MG tablet Commonly known as: DEMADEX Take 2 tablets (40mg ) by mouth every OTHER day. What changed:   how much to take  how to take this  when to take this  additional instructions   Victoza 18 MG/3ML Sopn Generic drug: liraglutide Inject 0.3 mLs (1.8 mg total) into the skin daily before supper. What changed: how much to take   vitamin B-12 100 MCG tablet Commonly known as: CYANOCOBALAMIN Take 100 mcg by mouth daily.      Follow-up Information    [facility], [facility] Follow [facility].   Specialty: Home Health Services Why: The home health agency will contact you for the first home visit.  Contact information: [name] [address] [phone]        David Barrack, MD Follow up in 1 week(s).   Specialty: Family Medicine Contact information: [place] [address] [phone]          Allergies  Allergen Reactions  . Ativan [Lorazepam] Anxiety    Pt gets more agitated  . Adhesive [Tape] Other (See Comments)    blisters    Consultations: Neurology   Procedures/Studies: Mr Angio Head Wo Contrast  Result Date: 01/07/2019 CLINICAL DATA:  TIA.  Confusion, speech disturbance, and  weakness. EXAM: MRI HEAD WITHOUT CONTRAST MRA HEAD WITHOUT CONTRAST TECHNIQUE: Multiplanar, multiecho pulse sequences of the brain and surrounding structures were obtained without intravenous contrast. Angiographic images of the head were obtained using MRA technique without contrast. COMPARISON:  Head CT 01/05/2019 and MRI 08/31/2017 FINDINGS: MRI HEAD FINDINGS The study is mildly motion degraded despite the use of more motion resistant imaging protocols. Brain: There is no evidence of acute infarct, intracranial hemorrhage, mass, midline shift, or extra-axial fluid collection. There is mild-to-moderate cerebral atrophy. Bilateral cerebral white matter T2 hyperintensities are nonspecific but compatible with mild chronic small vessel ischemic disease, similar to the prior incomplete MRI. Vascular: Major intracranial vascular flow voids are preserved. Skull and upper cervical spine: Unremarkable bone marrow signal. Sinuses/Orbits: Bilateral cataract extraction. Trace right mastoid fluid. Clear paranasal sinuses. Other: None. MRA HEAD FINDINGS The visualized distal vertebral arteries are widely patent to the basilar and codominant. Patent left PICA, bilateral AICA, and bilateral SCA  origins are identified with the right AICA being duplicated. The basilar artery is widely patent. Posterior communicating arteries are not identified and may be small or absent. Both PCAs are patent without evidence of significant proximal stenosis. The internal carotid arteries are patent from skull base to carotid termini with mild paraclinoid stenosis on the left. ACAs and MCAs are patent without evidence of proximal branch occlusion or significant proximal stenosis. No aneurysm is identified. IMPRESSION: 1. No acute intracranial abnormality. 2. Mild chronic small vessel ischemic disease. 3. Mild left ICA stenosis.  No large vessel occlusion. Electronically Signed   By: Logan Bores [name]   On: 01/07/2019 14:33   Mr Brain Wo  Contrast  Result Date: 01/07/2019 CLINICAL DATA:  TIA.  Confusion, speech disturbance, and weakness. EXAM: MRI HEAD WITHOUT CONTRAST MRA HEAD WITHOUT CONTRAST TECHNIQUE: Multiplanar, multiecho pulse sequences of the brain and surrounding structures were obtained without intravenous contrast. Angiographic images of the head were obtained using MRA technique without contrast. COMPARISON:  Head CT 01/05/2019 and MRI 08/31/2017 FINDINGS: MRI HEAD FINDINGS The study is mildly motion degraded despite the use of more motion resistant imaging protocols. Brain: There is no evidence of acute infarct, intracranial hemorrhage, mass, midline shift, or extra-axial fluid collection. There is mild-to-moderate cerebral atrophy. Bilateral cerebral white matter T2 hyperintensities are nonspecific but compatible with mild chronic small vessel ischemic disease, similar to the prior incomplete MRI. Vascular: Major intracranial vascular flow voids are preserved. Skull and upper cervical spine: Unremarkable bone marrow signal. Sinuses/Orbits: Bilateral cataract extraction. Trace right mastoid fluid. Clear paranasal sinuses. Other: None. MRA HEAD FINDINGS The visualized distal vertebral arteries are widely patent to the basilar and codominant. Patent left PICA, bilateral AICA, and bilateral SCA origins are identified with the right AICA being duplicated. The basilar artery is widely patent. Posterior communicating arteries are not identified and may be small or absent. Both PCAs are patent without evidence of significant proximal stenosis. The internal carotid arteries are patent from skull base to carotid termini with mild paraclinoid stenosis on the left. ACAs and MCAs are patent without evidence of proximal branch occlusion or significant proximal stenosis. No aneurysm is identified. IMPRESSION: 1. No acute intracranial abnormality. 2. Mild chronic small vessel ischemic disease. 3. Mild left ICA stenosis.  No large vessel occlusion.  Electronically Signed   By: Logan Bores [name]   On: 01/07/2019 14:33   Dg Chest Portable 1 View  Result Date: 01/05/2019 CLINICAL DATA:  Confusion.  Disorientation. EXAM: PORTABLE CHEST 1 VIEW COMPARISON:  09/02/2017 FINDINGS: 1647 hours. The lungs are clear without focal pneumonia, edema, pneumothorax or pleural effusion. The cardio pericardial silhouette is enlarged. Left permanent pacemaker again noted. The visualized bony structures of the thorax are intact. Telemetry leads overlie the chest. IMPRESSION: Cardiomegaly without acute cardiopulmonary findings. Electronically Signed   By: Misty Stanley [name]   On: 01/05/2019 17:04   Dg Foot Complete Left  Result Date: 01/01/2019 Please see detailed radiograph report in office note.  Ct Head Code Stroke Wo Contrast  Result Date: 01/05/2019 CLINICAL DATA:  Code stroke.  Expressive aphasia EXAM: CT HEAD WITHOUT CONTRAST TECHNIQUE: Contiguous axial images were obtained from the base of the skull through the vertex without intravenous contrast. COMPARISON:  08/30/2017 FINDINGS: Brain: No evidence of acute infarction, hemorrhage, hydrocephalus, extra-axial collection or mass lesion/mass effect. Small vessel ischemic gliosis in the deep cerebral white matter. Mild for age volume loss. Small calcifications along the right cerebral convexity, unchanged. These could be vascular or  subarachnoid/inflammatory. Vascular: Atherosclerotic calcification.  No hyperdense vessel. Skull: No acute or aggressive finding Sinuses/Orbits: Bilateral cataract resection Other: These results were communicated to Xu at 3:45 pmon 8/22/2020by text page via the [facility] messaging system. ASPECTS [facility] Stroke Program Early CT Score) - Ganglionic level infarction (caudate, lentiform nuclei, internal capsule, insula, M1-M3 cortex): 7 - Supraganglionic infarction (M4-M6 cortex): 3 Total score (0-10 with 10 being normal): 10 IMPRESSION: 1. No acute finding.  ASPECTS is 10. 2. Atrophy and  chronic small vessel ischemia. Electronically Signed   By: Monte Fantasia [name]   On: 01/05/2019 15:46   Vas US Carotid (at [place] Only)  Result Date: 01/07/2019 Carotid Arterial Duplex Study Indications:       CVA, confusion. Risk Factors:      Hypertension, hyperlipidemia, Diabetes, coronary artery                    disease, prior CVA. Other Factors:     Atrial fibrillation, pacemaker, CHF. Limitations        Today's exam was limited due to the body habitus of the                    patient and confusion. Comparison Study:  Prior study from 08/31/17 is available Performing Technologist: Sharion Dove RVS  Examination Guidelines: A complete evaluation includes B-mode imaging, spectral Doppler, color Doppler, and power Doppler as needed of all accessible portions of each vessel. Bilateral testing is considered an integral part of a complete examination. Limited examinations for reoccurring indications may be performed as noted.  Right Carotid Findings: +----------+--------+--------+--------+------------------+------------------+           PSV cm/sEDV cm/sStenosisPlaque DescriptionComments           +----------+--------+--------+--------+------------------+------------------+ CCA Prox  147     23                                intimal thickening +----------+--------+--------+--------+------------------+------------------+ CCA Distal51      9                                 intimal thickening +----------+--------+--------+--------+------------------+------------------+ ICA Distal105     42                                                   +----------+--------+--------+--------+------------------+------------------+ ECA       112     6                                                    +----------+--------+--------+--------+------------------+------------------+ +----------+--------+-------+--------+-------------------+           PSV cm/sEDV cmsDescribeArm Pressure  (mmHG) +----------+--------+-------+--------+-------------------+ [id number]                                         +----------+--------+-------+--------+-------------------+ +---------+--------+--+--------+-+ VertebralPSV cm/s63EDV cm/s4 +---------+--------+--+--------+-+   Left Carotid Findings: +----------+--------+--------+--------+------------------+--------+           PSV cm/sEDV cm/sStenosisPlaque DescriptionComments +----------+--------+--------+--------+------------------+--------+ CCA Prox  112     18                                         +----------+--------+--------+--------+------------------+--------+ CCA Distal58      22                                         +----------+--------+--------+--------+------------------+--------+ ICA Prox  114     23                                         +----------+--------+--------+--------+------------------+--------+ ICA Distal92      27                                         +----------+--------+--------+--------+------------------+--------+ ECA       120     18                                         +----------+--------+--------+--------+------------------+--------+ +----------+--------+--------+------------+-------------------+ SubclavianPSV cm/sEDV cm/sDescribe    Arm Pressure (mmHG) +----------+--------+--------+------------+-------------------+                           Not assessed                    +----------+--------+--------+------------+-------------------+ +---------+--------+---+--------+--+ VertebralPSV cm/s130EDV cm/s21 +---------+--------+---+--------+--+   Summary: Right Carotid: The extracranial vessels were near-normal with only minimal wall                thickening or plaque. No significant change from prior study done                08/31/17. Left Carotid: The extracranial vessels were near-normal with only minimal wall               thickening or plaque. No  significant change from prior study done               08/31/17. Vertebrals:  Bilateral vertebral arteries demonstrate antegrade flow. 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 Normal flow hemodynamics              were seen in the right subclavian artery. *See table(s) above for measurements and observations.  Electronically signed by Antony Contras MD on 01/07/2019 at 8:18:17 AM.    Final       Discharge Exam: Vitals:   01/07/19 0413 01/07/19 0741  BP: (!) 114/58 (!) 118/96  Pulse: 61 61  Resp: 18 17  Temp: 97.8 F (36.6 C) 98.1 F (36.7 C)  SpO2: 98% 99%   Vitals:   01/06/19 2005 01/06/19 2304 01/07/19 0413 01/07/19 0741  BP: (!) 103/42 (!) 142/58 (!) 114/58 (!) 118/96  Pulse: 61 (!) 59 61 61  Resp: 16 (!) 22 18 17   Temp: 98 F (36.7 C) 98 F (36.7 C) 97.8 F (36.6 C) 98.1 F (36.7 C)  TempSrc: Oral Oral Oral Oral  SpO2: 99% 99% 98% 99%  Weight:      Height:  General: Pt is alert, awake, not in acute distress, morbidly obese Cardiovascular: RRR, S1/S2 +, no rubs, no gallops Respiratory: CTA bilaterally, no wheezing, no rhonchi Abdominal: Soft, NT, ND, bowel sounds + Extremities: no edema, no cyanosis    The results of significant diagnostics from this hospitalization (including imaging, microbiology, ancillary and laboratory) are listed below for reference.     Microbiology: No results found for this or any previous visit (from the past 240 hour(s)).   Labs: BNP (last 3 results) Recent Labs    01/05/19 1434  BNP 024.0*   Basic Metabolic Panel: Recent Labs  Lab 01/05/19 1434  NA 139  K 4.4  CL 107  CO2 22  GLUCOSE 189*  BUN 43*  CREATININE 1.85*  CALCIUM 9.3   Liver Function Tests: Recent Labs  Lab 01/05/19 1434  AST 27  ALT 29  ALKPHOS 85  BILITOT 0.7  PROT 7.0  ALBUMIN 3.7   No results for input(s): LIPASE, AMYLASE in the last 168 hours. No results for input(s): AMMONIA in the last 168 hours. CBC: Recent Labs  Lab  01/05/19 1434  WBC 11.2*  NEUTROABS 8.3*  HGB 11.6*  HCT 37.0*  MCV 94.4  PLT 224   Cardiac Enzymes: No results for input(s): CKTOTAL, CKMB, CKMBINDEX, TROPONINI in the last 168 hours. BNP: Invalid input(s): POCBNP CBG: Recent Labs  Lab 01/06/19 2248 01/07/19 0014 01/07/19 0411 01/07/19 0736 01/07/19 1347  GLUCAP 381* 404* 280* 266* 267*   D-Dimer No results for input(s): DDIMER in the last 72 hours. Hgb A1c Recent Labs    01/06/19 0558  HGBA1C 7.4*   Lipid Profile Recent Labs    01/06/19 0558  CHOL 138  HDL 23*  LDLCALC 73  TRIG 210*  CHOLHDL 6.0   Thyroid function studies No results for input(s): TSH, T4TOTAL, T3FREE, THYROIDAB in the last 72 hours.  Invalid input(s): FREET3 Anemia work up No results for input(s): VITAMINB12, FOLATE, FERRITIN, TIBC, IRON, RETICCTPCT in the last 72 hours. Urinalysis    Component Value Date/Time   COLORURINE STRAW (A) 01/05/2019 1416   APPEARANCEUR CLEAR 01/05/2019 1416   LABSPEC 1.008 01/05/2019 1416   PHURINE 5.0 01/05/2019 1416   GLUCOSEU NEGATIVE 01/05/2019 1416   GLUCOSEU NEGATIVE 03/03/2015 1501   HGBUR SMALL (A) 01/05/2019 1416   BILIRUBINUR NEGATIVE 01/05/2019 1416   KETONESUR NEGATIVE 01/05/2019 1416   PROTEINUR 30 (A) 01/05/2019 1416   UROBILINOGEN 0.2 03/03/2015 1501   NITRITE NEGATIVE 01/05/2019 1416   LEUKOCYTESUR NEGATIVE 01/05/2019 1416   Sepsis Labs Invalid input(s): PROCALCITONIN,  WBC,  LACTICIDVEN Microbiology No results found for this or any previous visit (from the past 240 hour(s)).   Time coordinating discharge: Over 30 minutes  SIGNED:   Darliss Cheney, MD  [facility] 01/07/2019, 2:53 PM Pager [phone]  If 7PM-7AM, please contact night-coverage [email or web address] Password [email or web address]

## 2019-01-08 ENCOUNTER — Telehealth: Payer: Self-pay

## 2019-01-08 ENCOUNTER — Other Ambulatory Visit: Payer: Self-pay | Admitting: Family Medicine

## 2019-01-08 ENCOUNTER — Inpatient Hospital Stay: Payer: Medicare Other | Admitting: Family Medicine

## 2019-01-08 ENCOUNTER — Ambulatory Visit: Payer: Self-pay

## 2019-01-08 ENCOUNTER — Telehealth: Payer: Medicare Other | Admitting: Cardiology

## 2019-01-08 ENCOUNTER — Telehealth: Payer: Self-pay | Admitting: Endocrinology

## 2019-01-08 ENCOUNTER — Telehealth: Payer: Self-pay | Admitting: Family Medicine

## 2019-01-08 ENCOUNTER — Encounter: Payer: Self-pay | Admitting: Family Medicine

## 2019-01-08 ENCOUNTER — Other Ambulatory Visit: Payer: Self-pay

## 2019-01-08 DIAGNOSIS — E1159 Type 2 diabetes mellitus with other circulatory complications: Secondary | ICD-10-CM | POA: Diagnosis not present

## 2019-01-08 DIAGNOSIS — M179 Osteoarthritis of knee, unspecified: Secondary | ICD-10-CM | POA: Diagnosis not present

## 2019-01-08 DIAGNOSIS — D692 Other nonthrombocytopenic purpura: Secondary | ICD-10-CM | POA: Diagnosis not present

## 2019-01-08 DIAGNOSIS — I69011 Memory deficit following nontraumatic subarachnoid hemorrhage: Secondary | ICD-10-CM | POA: Diagnosis not present

## 2019-01-08 DIAGNOSIS — D509 Iron deficiency anemia, unspecified: Secondary | ICD-10-CM | POA: Diagnosis not present

## 2019-01-08 DIAGNOSIS — E1122 Type 2 diabetes mellitus with diabetic chronic kidney disease: Secondary | ICD-10-CM | POA: Diagnosis not present

## 2019-01-08 DIAGNOSIS — I25119 Atherosclerotic heart disease of native coronary artery with unspecified angina pectoris: Secondary | ICD-10-CM | POA: Diagnosis not present

## 2019-01-08 DIAGNOSIS — I69392 Facial weakness following cerebral infarction: Secondary | ICD-10-CM | POA: Diagnosis not present

## 2019-01-08 DIAGNOSIS — I4891 Unspecified atrial fibrillation: Secondary | ICD-10-CM | POA: Diagnosis not present

## 2019-01-08 DIAGNOSIS — E785 Hyperlipidemia, unspecified: Secondary | ICD-10-CM | POA: Diagnosis not present

## 2019-01-08 DIAGNOSIS — E1151 Type 2 diabetes mellitus with diabetic peripheral angiopathy without gangrene: Secondary | ICD-10-CM | POA: Diagnosis not present

## 2019-01-08 DIAGNOSIS — I674 Hypertensive encephalopathy: Secondary | ICD-10-CM | POA: Diagnosis not present

## 2019-01-08 DIAGNOSIS — I5032 Chronic diastolic (congestive) heart failure: Secondary | ICD-10-CM | POA: Diagnosis not present

## 2019-01-08 DIAGNOSIS — M160 Bilateral primary osteoarthritis of hip: Secondary | ICD-10-CM | POA: Diagnosis not present

## 2019-01-08 DIAGNOSIS — E1142 Type 2 diabetes mellitus with diabetic polyneuropathy: Secondary | ICD-10-CM | POA: Diagnosis not present

## 2019-01-08 DIAGNOSIS — G4733 Obstructive sleep apnea (adult) (pediatric): Secondary | ICD-10-CM | POA: Diagnosis not present

## 2019-01-08 DIAGNOSIS — E538 Deficiency of other specified B group vitamins: Secondary | ICD-10-CM | POA: Diagnosis not present

## 2019-01-08 DIAGNOSIS — N2581 Secondary hyperparathyroidism of renal origin: Secondary | ICD-10-CM | POA: Diagnosis not present

## 2019-01-08 DIAGNOSIS — N400 Enlarged prostate without lower urinary tract symptoms: Secondary | ICD-10-CM | POA: Diagnosis not present

## 2019-01-08 DIAGNOSIS — I130 Hypertensive heart and chronic kidney disease with heart failure and stage 1 through stage 4 chronic kidney disease, or unspecified chronic kidney disease: Secondary | ICD-10-CM | POA: Diagnosis not present

## 2019-01-08 DIAGNOSIS — F419 Anxiety disorder, unspecified: Secondary | ICD-10-CM | POA: Diagnosis not present

## 2019-01-08 DIAGNOSIS — I459 Conduction disorder, unspecified: Secondary | ICD-10-CM | POA: Diagnosis not present

## 2019-01-08 DIAGNOSIS — I421 Obstructive hypertrophic cardiomyopathy: Secondary | ICD-10-CM | POA: Diagnosis not present

## 2019-01-08 DIAGNOSIS — E1169 Type 2 diabetes mellitus with other specified complication: Secondary | ICD-10-CM | POA: Diagnosis not present

## 2019-01-08 DIAGNOSIS — N184 Chronic kidney disease, stage 4 (severe): Secondary | ICD-10-CM | POA: Diagnosis not present

## 2019-01-08 LAB — NOVEL CORONAVIRUS, NAA (HOSP ORDER, SEND-OUT TO REF LAB; TAT 18-24 HRS): SARS-CoV-2, NAA: UNDETERMINED — AB

## 2019-01-08 MED ORDER — "INSULIN SYRINGE 31G X 5/16"" 1 ML MISC": 1.0000 | Freq: Three times a day (TID) | 2 refills | Status: AC

## 2019-01-08 MED ORDER — GLUCOSE BLOOD VI STRP: ORAL_STRIP | 2 refills | Status: DC

## 2019-01-08 NOTE — Telephone Encounter (Signed)
Noted. Dr. [name] aware.

## 2019-01-08 NOTE — Telephone Encounter (Signed)
Called pt's wife and gave her MD message. She wrote down instructions and read them back correctly.

## 2019-01-08 NOTE — Telephone Encounter (Signed)
Home Health Verbal Orders - Caller/Agency: Bhavin with [facility] Number: [phone], OK to leave a message Requesting OT/PT/Skilled Nursing/Social Work/Speech Therapy: PT Frequency: once a week for 6 weeks.  Requesting OT/PT/Skilled Nursing/Social Work/Speech Therapy: skilled nursing Frequency: evaluation for medication management  Requesting OT/PT/Skilled Nursing/Social Work/Speech Therapy: Home Health aid Frequency: 1 time a week for one week, 2 times a week for three weeks

## 2019-01-08 NOTE — Telephone Encounter (Signed)
Patient son called stating that Saturday his fathers behavior abruptly changed. He took him to the hospital and they ran many test and sent him home yesterday. Today he is restless confused and sometimes combative. Son states that his behavior is hard for them to handle at home. He has not threatened his life or others life. Son states his father will sleep for a few minutes and then wake again. The patient and his wife have recently moved within the complex where they have been living but no major life events to explain his behavior. He is very restless. Hx of anxiety and taking Zoloft as prescribed. Son was told to take his father to the ER again for evaluation. Dr [name] will be notified  Reason for Disposition . Patient sounds very sick or weak to the triager  Answer Assessment - Initial Assessment Questions 1. CONCERN: "What happened that made you call today?"     Father is confused combative at times 2. ANXIETY SYMPTOM SCREENING: "Can you describe how you have been feeling?"  (e.g., tense, restless, panicky, anxious, keyed up, trouble sleeping, trouble concentrating)     restless 3. ONSET: "How long have you been feeling this way?"     Saturday extremely confused 4. RECURRENT: "Have you felt this way before?"  If yes: "What happened that time?" "What helped these feelings go away in the past?"     No 5. RISK OF HARM - SUICIDAL IDEATION:  "Do you ever have thoughts of hurting or killing yourself?"  (e.g., yes, no, no but preoccupation with thoughts about death)   - INTENT:  "Do you have thoughts of hurting or killing yourself right NOW?" (e.g., yes, no, N/A)   - PLAN: "Do you have a specific plan for how you would do this?" (e.g., gun, knife, overdose, no plan, N/A)    No 6. RISK OF HARM - HOMICIDAL IDEATION:  "Do you ever have thoughts of hurting or killing someone else?"  (e.g., yes, no, no but preoccupation with thoughts about death)   - INTENT:  "Do you have thoughts of hurting or killing  someone right NOW?" (e.g., yes, no, N/A)   - PLAN: "Do you have a specific plan for how you would do this?" (e.g., gun, knife, no plan, N/A)      No 7. FUNCTIONAL IMPAIRMENT: "How have things been going for you overall in your life? Have you had any more difficulties than usual doing your normal daily activities?"  (e.g., better, same, worse; self-care, school, work, interactions)    Moved just between one complex to another 8. SUPPORT: "Who is with you now?" "Who do you live with?" "Do you have family or friends nearby who you can talk to?"      family 42. THERAPIST: "Do you have a counselor or therapist? Name?"     No 10. STRESSORS: "Has there been any new stress or recent changes in your life?"     None 11. CAFFEINE ABUSE: "Do you drink caffeinated beverages, and how much each day?" (e.g., coffee, tea, colas)      no 12. SUBSTANCE ABUSE: "Do you use any illegal drugs or alcohol?"       No 13. OTHER SYMPTOMS: "Do you have any other physical symptoms right now?" (e.g., chest pain, palpitations, difficulty breathing, fever)       No fever has had some breathing issues changed to water pill dose for 2 days 14. PREGNANCY: "Is there any chance you are pregnant?" "When was your last  menstrual period?"      N/A  Protocols used: ANXIETY AND PANIC ATTACK-A-AH

## 2019-01-08 NOTE — Telephone Encounter (Signed)
Called pt to clarify what he is needing. Pt's daughter in law answered and informed me that the pt was recently discharged from the hospital and is currently not using his insulin pump. Family is giving pt shots of insulin at this time, and they would like directions on what insulin to give him and at what times. Pt's daughter in law stated that at this time, the pt is very confused and is unable to self administer insulin or properly use the pump himself, and family is uncomfortable and unsure how to use it properly. For this reason, the family would like to give the pt multiple daily injections.

## 2019-01-08 NOTE — Telephone Encounter (Signed)
MEDICATION: [employer]  PHARMACY: [facility] , [place] [address]   IS THIS A 90 DAY SUPPLY :   IS PATIENT OUT OF MEDICATION: Yes  IF NOT; HOW MUCH IS LEFT:   LAST APPOINTMENT DATE: @8 /19/2020  NEXT APPOINTMENT DATE:@10 /20/2020  DO WE HAVE YOUR PERMISSION TO LEAVE A DETAILED MESSAGE:  OTHER COMMENTS:    **Let patient know to contact pharmacy at the end of the day to make sure medication is ready. **  ** Please notify patient to allow 48-72 hours to process**  **Encourage patient to contact the pharmacy for refills or they can request refills through [facility]**

## 2019-01-08 NOTE — Telephone Encounter (Signed)
They can give injections of the U-500 insulin with a normal insulin syringe.  Recommended dose will be 12 units on waking up, 12 units, 30 minutes before lunch and 10 units, 30 minutes before dinner.  May need to schedule a virtual visit within the next week depending on how his sugars are.

## 2019-01-08 NOTE — Telephone Encounter (Signed)
See correct note with details in pts chart.

## 2019-01-09 ENCOUNTER — Telehealth: Payer: Self-pay | Admitting: Cardiology

## 2019-01-09 ENCOUNTER — Emergency Department (HOSPITAL_COMMUNITY): Payer: Medicare Other

## 2019-01-09 ENCOUNTER — Inpatient Hospital Stay (HOSPITAL_COMMUNITY)
Admission: EM | Admit: 2019-01-09 | Discharge: 2019-01-12 | DRG: 637 | Disposition: A | Discharge status: Home Health | Payer: Medicare Other | Attending: Family Medicine | Admitting: Family Medicine

## 2019-01-09 ENCOUNTER — Encounter (HOSPITAL_COMMUNITY): Payer: Self-pay

## 2019-01-09 ENCOUNTER — Telehealth: Payer: Self-pay

## 2019-01-09 DIAGNOSIS — Z950 Presence of cardiac pacemaker: Secondary | ICD-10-CM

## 2019-01-09 DIAGNOSIS — E1159 Type 2 diabetes mellitus with other circulatory complications: Secondary | ICD-10-CM | POA: Diagnosis not present

## 2019-01-09 DIAGNOSIS — R52 Pain, unspecified: Secondary | ICD-10-CM | POA: Diagnosis not present

## 2019-01-09 DIAGNOSIS — I130 Hypertensive heart and chronic kidney disease with heart failure and stage 1 through stage 4 chronic kidney disease, or unspecified chronic kidney disease: Secondary | ICD-10-CM | POA: Diagnosis not present

## 2019-01-09 DIAGNOSIS — IMO0001 Reserved for inherently not codable concepts without codable children: Secondary | ICD-10-CM

## 2019-01-09 DIAGNOSIS — I2510 Atherosclerotic heart disease of native coronary artery without angina pectoris: Secondary | ICD-10-CM | POA: Diagnosis present

## 2019-01-09 DIAGNOSIS — E119 Type 2 diabetes mellitus without complications: Secondary | ICD-10-CM

## 2019-01-09 DIAGNOSIS — Z9842 Cataract extraction status, left eye: Secondary | ICD-10-CM

## 2019-01-09 DIAGNOSIS — Z888 Allergy status to other drugs, medicaments and biological substances status: Secondary | ICD-10-CM

## 2019-01-09 DIAGNOSIS — Z20828 Contact with and (suspected) exposure to other viral communicable diseases: Secondary | ICD-10-CM | POA: Diagnosis present

## 2019-01-09 DIAGNOSIS — E1122 Type 2 diabetes mellitus with diabetic chronic kidney disease: Secondary | ICD-10-CM | POA: Diagnosis present

## 2019-01-09 DIAGNOSIS — Z85828 Personal history of other malignant neoplasm of skin: Secondary | ICD-10-CM

## 2019-01-09 DIAGNOSIS — I10 Essential (primary) hypertension: Secondary | ICD-10-CM | POA: Diagnosis not present

## 2019-01-09 DIAGNOSIS — R404 Transient alteration of awareness: Secondary | ICD-10-CM | POA: Diagnosis not present

## 2019-01-09 DIAGNOSIS — E1169 Type 2 diabetes mellitus with other specified complication: Secondary | ICD-10-CM | POA: Diagnosis present

## 2019-01-09 DIAGNOSIS — Z9641 Presence of insulin pump (external) (internal): Secondary | ICD-10-CM | POA: Diagnosis present

## 2019-01-09 DIAGNOSIS — R9389 Abnormal findings on diagnostic imaging of other specified body structures: Secondary | ICD-10-CM

## 2019-01-09 DIAGNOSIS — Z89421 Acquired absence of other right toe(s): Secondary | ICD-10-CM

## 2019-01-09 DIAGNOSIS — I4891 Unspecified atrial fibrillation: Secondary | ICD-10-CM | POA: Diagnosis not present

## 2019-01-09 DIAGNOSIS — N179 Acute kidney failure, unspecified: Secondary | ICD-10-CM

## 2019-01-09 DIAGNOSIS — E860 Dehydration: Secondary | ICD-10-CM

## 2019-01-09 DIAGNOSIS — I152 Hypertension secondary to endocrine disorders: Secondary | ICD-10-CM

## 2019-01-09 DIAGNOSIS — R4182 Altered mental status, unspecified: Secondary | ICD-10-CM | POA: Diagnosis not present

## 2019-01-09 DIAGNOSIS — G4730 Sleep apnea, unspecified: Secondary | ICD-10-CM | POA: Diagnosis present

## 2019-01-09 DIAGNOSIS — Z809 Family history of malignant neoplasm, unspecified: Secondary | ICD-10-CM

## 2019-01-09 DIAGNOSIS — N183 Chronic kidney disease, stage 3 (moderate): Secondary | ICD-10-CM | POA: Diagnosis present

## 2019-01-09 DIAGNOSIS — E11649 Type 2 diabetes mellitus with hypoglycemia without coma: Principal | ICD-10-CM | POA: Diagnosis present

## 2019-01-09 DIAGNOSIS — I509 Heart failure, unspecified: Secondary | ICD-10-CM | POA: Diagnosis present

## 2019-01-09 DIAGNOSIS — E1142 Type 2 diabetes mellitus with diabetic polyneuropathy: Secondary | ICD-10-CM | POA: Diagnosis present

## 2019-01-09 DIAGNOSIS — Z9841 Cataract extraction status, right eye: Secondary | ICD-10-CM

## 2019-01-09 DIAGNOSIS — E162 Hypoglycemia, unspecified: Secondary | ICD-10-CM | POA: Diagnosis not present

## 2019-01-09 DIAGNOSIS — G4733 Obstructive sleep apnea (adult) (pediatric): Secondary | ICD-10-CM | POA: Diagnosis present

## 2019-01-09 DIAGNOSIS — Z8249 Family history of ischemic heart disease and other diseases of the circulatory system: Secondary | ICD-10-CM

## 2019-01-09 DIAGNOSIS — G9341 Metabolic encephalopathy: Secondary | ICD-10-CM | POA: Diagnosis not present

## 2019-01-09 DIAGNOSIS — N400 Enlarged prostate without lower urinary tract symptoms: Secondary | ICD-10-CM | POA: Diagnosis present

## 2019-01-09 DIAGNOSIS — N184 Chronic kidney disease, stage 4 (severe): Secondary | ICD-10-CM | POA: Diagnosis not present

## 2019-01-09 DIAGNOSIS — I421 Obstructive hypertrophic cardiomyopathy: Secondary | ICD-10-CM | POA: Diagnosis not present

## 2019-01-09 DIAGNOSIS — Z8673 Personal history of transient ischemic attack (TIA), and cerebral infarction without residual deficits: Secondary | ICD-10-CM

## 2019-01-09 DIAGNOSIS — R4020 Unspecified coma: Secondary | ICD-10-CM | POA: Diagnosis not present

## 2019-01-09 DIAGNOSIS — E785 Hyperlipidemia, unspecified: Secondary | ICD-10-CM | POA: Diagnosis present

## 2019-01-09 DIAGNOSIS — I674 Hypertensive encephalopathy: Secondary | ICD-10-CM | POA: Diagnosis not present

## 2019-01-09 DIAGNOSIS — E161 Other hypoglycemia: Secondary | ICD-10-CM | POA: Diagnosis not present

## 2019-01-09 DIAGNOSIS — E1151 Type 2 diabetes mellitus with diabetic peripheral angiopathy without gangrene: Secondary | ICD-10-CM | POA: Diagnosis present

## 2019-01-09 DIAGNOSIS — Z823 Family history of stroke: Secondary | ICD-10-CM

## 2019-01-09 DIAGNOSIS — Z794 Long term (current) use of insulin: Secondary | ICD-10-CM

## 2019-01-09 DIAGNOSIS — Z79899 Other long term (current) drug therapy: Secondary | ICD-10-CM

## 2019-01-09 DIAGNOSIS — Z87891 Personal history of nicotine dependence: Secondary | ICD-10-CM

## 2019-01-09 DIAGNOSIS — Z7902 Long term (current) use of antithrombotics/antiplatelets: Secondary | ICD-10-CM

## 2019-01-09 DIAGNOSIS — I5032 Chronic diastolic (congestive) heart failure: Secondary | ICD-10-CM | POA: Diagnosis not present

## 2019-01-09 LAB — BLOOD GAS, ARTERIAL
Acid-base deficit: 1.5 mmol/L (ref 0.0–2.0)
Bicarbonate: 22.8 mmol/L (ref 20.0–28.0)
Drawn by: 225631
FIO2: 21
O2 Saturation: 96.5 %
Patient temperature: 98.4
pCO2 arterial: 38.8 mmHg (ref 32.0–48.0)
pH, Arterial: 7.386 (ref 7.350–7.450)
pO2, Arterial: 87.3 mmHg (ref 83.0–108.0)

## 2019-01-09 LAB — URINALYSIS, ROUTINE W REFLEX MICROSCOPIC
Bilirubin Urine: NEGATIVE
Glucose, UA: NEGATIVE mg/dL
Hgb urine dipstick: NEGATIVE
Ketones, ur: NEGATIVE mg/dL
Leukocytes,Ua: NEGATIVE
Nitrite: NEGATIVE
Protein, ur: NEGATIVE mg/dL
Specific Gravity, Urine: 1.009 (ref 1.005–1.030)
pH: 5 (ref 5.0–8.0)

## 2019-01-09 LAB — COMPREHENSIVE METABOLIC PANEL
ALT: 25 U/L (ref 0–44)
AST: 27 U/L (ref 15–41)
Albumin: 3.8 g/dL (ref 3.5–5.0)
Alkaline Phosphatase: 90 U/L (ref 38–126)
Anion gap: 11 (ref 5–15)
BUN: 69 mg/dL — ABNORMAL HIGH (ref 8–23)
CO2: 23 mmol/L (ref 22–32)
Calcium: 9.4 mg/dL (ref 8.9–10.3)
Chloride: 105 mmol/L (ref 98–111)
Creatinine, Ser: 2.22 mg/dL — ABNORMAL HIGH (ref 0.61–1.24)
GFR calc Af Amer: 32 mL/min — ABNORMAL LOW (ref >60)
GFR calc non Af Amer: 27 mL/min — ABNORMAL LOW (ref >60)
Glucose, Bld: 79 mg/dL (ref 70–99)
Potassium: 3.8 mmol/L (ref 3.5–5.1)
Sodium: 139 mmol/L (ref 135–145)
Total Bilirubin: 0.4 mg/dL (ref 0.3–1.2)
Total Protein: 7 g/dL (ref 6.5–8.1)

## 2019-01-09 LAB — CBC
HCT: 34.9 % — ABNORMAL LOW (ref 39.0–52.0)
Hemoglobin: 11.2 g/dL — ABNORMAL LOW (ref 13.0–17.0)
MCH: 29.9 pg (ref 26.0–34.0)
MCHC: 32.1 g/dL (ref 30.0–36.0)
MCV: 93.1 fL (ref 80.0–100.0)
Platelets: 221 10*3/uL (ref 150–400)
RBC: 3.75 MIL/uL — ABNORMAL LOW (ref 4.22–5.81)
RDW: 15.7 % — ABNORMAL HIGH (ref 11.5–15.5)
WBC: 11.8 10*3/uL — ABNORMAL HIGH (ref 4.0–10.5)
nRBC: 0 % (ref 0.0–0.2)

## 2019-01-09 LAB — CBG MONITORING, ED
Glucose-Capillary: 103 mg/dL — ABNORMAL HIGH (ref 70–99)
Glucose-Capillary: 68 mg/dL — ABNORMAL LOW (ref 70–99)
Glucose-Capillary: 108 mg/dL — ABNORMAL HIGH (ref 70–99)
Glucose-Capillary: 110 mg/dL — ABNORMAL HIGH (ref 70–99)

## 2019-01-09 MED ORDER — PANTOPRAZOLE SODIUM 40 MG PO TBEC
40.0000 mg | DELAYED_RELEASE_TABLET | Freq: Every day | ORAL | Status: DC
  Administered 2019-01-10 – 2019-01-12 (×3): 40 mg via ORAL
  Filled 2019-01-09 (×3): qty 1

## 2019-01-09 MED ORDER — SODIUM CHLORIDE 0.9 % IV SOLN
500.0000 mg | Freq: Every day | INTRAVENOUS | Status: DC
  Administered 2019-01-10: 01:00:00 500 mg via INTRAVENOUS
  Filled 2019-01-09 (×2): qty 500

## 2019-01-09 MED ORDER — SERTRALINE HCL 50 MG PO TABS
100.0000 mg | ORAL_TABLET | Freq: Every day | ORAL | Status: DC
  Administered 2019-01-10 – 2019-01-12 (×3): 100 mg via ORAL
  Filled 2019-01-09 (×3): qty 2

## 2019-01-09 MED ORDER — SODIUM CHLORIDE 0.9% FLUSH: 3.0000 mL | INTRAVENOUS | Status: DC | PRN

## 2019-01-09 MED ORDER — VITAMIN B-12 100 MCG PO TABS
100.0000 ug | ORAL_TABLET | Freq: Every day | ORAL | Status: DC
  Administered 2019-01-10 – 2019-01-12 (×3): 100 ug via ORAL
  Filled 2019-01-09 (×3): qty 1

## 2019-01-09 MED ORDER — DEXTROSE 50 % IV SOLN: 1.0000 | Freq: Once | INTRAVENOUS | Status: DC

## 2019-01-09 MED ORDER — ACETAMINOPHEN 650 MG RE SUPP: 650.0000 mg | Freq: Four times a day (QID) | RECTAL | Status: DC | PRN

## 2019-01-09 MED ORDER — SODIUM CHLORIDE 0.9 % IV SOLN: 250.0000 mL | INTRAVENOUS | Status: DC | PRN

## 2019-01-09 MED ORDER — CALCITRIOL 0.25 MCG PO CAPS
0.2500 ug | ORAL_CAPSULE | Freq: Every day | ORAL | Status: DC
  Administered 2019-01-10 – 2019-01-12 (×3): 0.25 ug via ORAL
  Filled 2019-01-09 (×3): qty 1

## 2019-01-09 MED ORDER — HEPARIN SODIUM (PORCINE) 5000 UNIT/ML IJ SOLN
5000.0000 [IU] | Freq: Three times a day (TID) | INTRAMUSCULAR | Status: DC
  Administered 2019-01-10 – 2019-01-12 (×6): 5000 [IU] via SUBCUTANEOUS
  Filled 2019-01-09 (×6): qty 1

## 2019-01-09 MED ORDER — HYDRALAZINE HCL 20 MG/ML IJ SOLN: 5.0000 mg | INTRAMUSCULAR | Status: DC | PRN

## 2019-01-09 MED ORDER — CLOPIDOGREL BISULFATE 75 MG PO TABS
75.0000 mg | ORAL_TABLET | Freq: Every day | ORAL | Status: DC
  Administered 2019-01-10 – 2019-01-12 (×3): 75 mg via ORAL
  Filled 2019-01-09 (×3): qty 1

## 2019-01-09 MED ORDER — ISOSORBIDE MONONITRATE ER 60 MG PO TB24
60.0000 mg | ORAL_TABLET | Freq: Every day | ORAL | Status: DC
  Administered 2019-01-10 – 2019-01-12 (×3): 60 mg via ORAL
  Filled 2019-01-09 (×3): qty 1

## 2019-01-09 MED ORDER — SODIUM CHLORIDE 0.9 % IV SOLN
1.0000 g | Freq: Every day | INTRAVENOUS | Status: DC
  Administered 2019-01-10: 01:00:00 1 g via INTRAVENOUS
  Filled 2019-01-09: qty 10
  Filled 2019-01-09: qty 1

## 2019-01-09 MED ORDER — VITAMIN D 25 MCG (1000 UNIT) PO TABS
1000.0000 [IU] | ORAL_TABLET | Freq: Every day | ORAL | Status: DC
  Administered 2019-01-10 – 2019-01-12 (×3): 1000 [IU] via ORAL
  Filled 2019-01-09 (×3): qty 1

## 2019-01-09 MED ORDER — SODIUM CHLORIDE 0.9% FLUSH
3.0000 mL | Freq: Two times a day (BID) | INTRAVENOUS | Status: DC
  Administered 2019-01-11 – 2019-01-12 (×2): 3 mL via INTRAVENOUS

## 2019-01-09 MED ORDER — ACETAMINOPHEN 325 MG PO TABS
650.0000 mg | ORAL_TABLET | Freq: Four times a day (QID) | ORAL | Status: DC | PRN
  Administered 2019-01-12: 650 mg via ORAL
  Filled 2019-01-09: qty 2

## 2019-01-09 MED ORDER — CARVEDILOL 25 MG PO TABS
25.0000 mg | ORAL_TABLET | Freq: Two times a day (BID) | ORAL | Status: DC
  Administered 2019-01-10 – 2019-01-12 (×5): 25 mg via ORAL
  Filled 2019-01-09 (×5): qty 1

## 2019-01-09 MED ORDER — SODIUM CHLORIDE 0.9% FLUSH: 3.0000 mL | Freq: Once | INTRAVENOUS | Status: DC

## 2019-01-09 MED ORDER — DEXTROSE 10 % IV SOLN
INTRAVENOUS | Status: DC
  Administered 2019-01-09: 21:00:00 via INTRAVENOUS

## 2019-01-09 MED ORDER — ATORVASTATIN CALCIUM 10 MG PO TABS
10.0000 mg | ORAL_TABLET | Freq: Every day | ORAL | Status: DC
  Administered 2019-01-10 – 2019-01-11 (×2): 10 mg via ORAL
  Filled 2019-01-09 (×2): qty 1

## 2019-01-09 NOTE — ED Notes (Signed)
Patient is resting comfortably. 

## 2019-01-09 NOTE — Telephone Encounter (Signed)
PER HOSPITAL VISIT ____________________________  Admit date: 01/05/2019 Discharge date: 01/07/2019  Admitted From: Home Disposition: Home  Recommendations for Outpatient Follow-up:  1. Follow up with PCP in 1-2 weeks 2. Please obtain BMP/CBC in one week 3. Please follow up on the following pending results:  Home Health: Yes Equipment/Devices: None  Discharge Condition: Stable CODE STATUS: Full code Diet recommendation: Cardiac/low-sodium  PER TELEPHONE CALL ____________________________ Transition Care Management Follow-up Telephone Call   Date discharged? 01/07/19   How have you been since you were released from the hospital? Patient has been confused and combative.   Do you understand why you were in the hospital? yes   Do you understand the discharge instructions? yes   Where were you discharged to? HOME   Items Reviewed:  Medications reviewed: yes  Allergies reviewed: yes  Dietary changes reviewed: yes  Referrals reviewed: yes   Functional Questionnaire:   Activities of Daily Living (ADLs):   He states they are independent in the following: N/A States they require assistance with the following: ambulation, bathing and hygiene, feeding, continence, grooming, toileting and dressing   Any transportation issues/concerns?: no   Any patient concerns? no   Confirmed importance and date/time of follow-up visits scheduled yes, Scheduled 01/10/19  Provider Appointment booked with Dr.Parker  Confirmed with patient if condition begins to worsen call PCP or go to the ER.  Patient was given the office number and encouraged to call back with question or concerns.  : yes

## 2019-01-09 NOTE — Telephone Encounter (Signed)
Left message ok for verbal orders

## 2019-01-09 NOTE — Progress Notes (Signed)
RT note: Pt. stated he uses CPAP @ home w/o Oxygen with nasal mask, Arterial Blood Gas drawn on room air, RR-16.

## 2019-01-09 NOTE — ED Triage Notes (Signed)
Patient arrived via [facility] from home.   Family states patient has been altered since last Saturday. [facility].   CBG- 141 at lunch at home. Patient took insulin and cbg dropped to 35.   Family called out for patient being unconscious and altered.   CBG-35 on arrival with ems   EMS gave 1 amp D10  CBG-171  Mental status did not improve.       CBG 69 in route EMS adminersted 1 am D10  Current cbg-103 in ED.   Patient is oriented to person and place and able to follow commands.   BP is now increasing.   BP-218/70 HR-60 Temp-98.7 RR-18   Patient told his family he thinks he might have fallen during the night.   Patient is not on blood thinners.    20g in right AC.

## 2019-01-09 NOTE — Telephone Encounter (Signed)
David [name] 9/1 at 9am needs to be cancelled as she is now Bensimhon's patient with sarcoid HF.  Please place that sleep patient in that slot

## 2019-01-09 NOTE — Telephone Encounter (Signed)
David [name], is there anyway you can possibly move this pts appt up to a sooner date than 10/5. Pt and wife are both stating he cannot wait that long to get his CPAP investigated?  Pts wife is established with Dr. Radford Pax for sleep, and she would like for Dr. Radford Pax to see the pt as well. Please follow-up with the pt and wife.

## 2019-01-09 NOTE — ED Provider Notes (Signed)
[facility] DEPT Provider Note   CSN: [id number] Arrival date & time: 01/09/19  1820     History   Chief Complaint Chief Complaint  Patient presents with   Hypoglycemia   Altered Mental Status    HPI David [name] is a 78 y.o. male.     Pt presents to the ED today with [facility].  The pt has not been his normal self since 8/22.  He initially went to [facility] as a code stroke on the 22nd for AMS.  Stroke work up was negative.  It was thought that his ALOC was due to hypertensive emergency.  The pt was admitted and d/c on 8/24.  Per family, he still has not been his normal self since that hospitalization. Today, he was very somnolent and confused.  This evening, the family was unable to wake him up, so they called EMS.  His initial bs was in the 30s, so he was given D10.  BS went up, but came back down, so he was given an additional D10.  His bp is also very high and is trending upwards.  Pt does not remember what happened today and is still confused.     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   nephrologist-  [name]   Coronary artery disease involving native coronary artery of native heart without angina pectoris    Diastolic dysfunction    Dyspnea    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   History of cellulitis    right great toe 10-25-2014   History of skin  cancer    HOCM (hypertrophic obstructive cardiomyopathy) (HCC)    Hyperlipidemia associated with type 2 diabetes mellitus ([place]) 10/25/2013   Hypertension    Hypertension associated with diabetes ([place]) 10/25/2013   IDDM (insulin dependent diabetes mellitus) - on insulin pump 03/04/2017   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   followd by [name]--  has insulin pump   Insulin pump in place    OSA on CPAP    Pacemaker 06/03/2015   Peripheral neuropathy    severe   Peripheral vascular disease ([place])    bilateral lower extremities   PVD (peripheral vascular disease) ([place])    Rib fracture 07/24/2015   Seasonal and perennial allergic rhinitis 10/25/2013   Seborrheic keratosis 09/19/2016   Secondary hyperparathyroidism of renal origin ([place])    Sinus node arrhythmia 06/03/2015   Sinus node dysfunction ([place])    Sleep apnea    Syncope 07/24/2015    Patient Active Problem List   Diagnosis Date Noted   AMS (altered mental status) 01/09/2019   Acute encephalopathy 01/07/2019   Hypertensive encephalopathy 01/07/2019   Encephalopathy acute 01/05/2019   Memory change 11/19/2018   Senile purpura ([place]) 11/27/2017   CKD (chronic kidney disease) stage 4, GFR 15-29 ml/min ([place]) 09/26/2017  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   Nonobstructive CAD s/p PCI 2012    Cardiac pacemaker in situ    PVD (peripheral vascular disease) ([place])    Anxiety 03/22/2017   Acute cholecystitis s/p lap cholecystectomy 03/05/2017 03/04/2017   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   IDDM (insulin dependent diabetes mellitus) - on insulin pump 03/04/2017   Fatigue 01/27/2017   Low vitamin B12 level 01/27/2017   OSA on CPAP 10/26/2014   Seasonal and perennial allergic rhinitis 10/25/2013   Hypertension associated with diabetes ([place]) 10/25/2013   Hyperlipidemia associated with type 2 diabetes mellitus  ([place]) 10/25/2013    Past Surgical History:  Procedure Laterality Date   AMPUTATION OF REPLICATED TOES  Mar 3149   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)   AMPUTATION TOE Right 03/12/2015   Procedure: RIGHT HALLUS AMPUTATION ;  Surgeon: [name], MD;  Location: [facility];  Service: Podiatry;  Laterality: Right;   CARDIAC CATHETERIZATION  11-25-2010   [place], [place]   Nonobstructive CAD   CARDIAC PACEMAKER PLACEMENT  Nov 2009   [employer]   CHOLECYSTECTOMY N/A 03/05/2017   Procedure: LAPAROSCOPIC CHOLECYSTECTOMY WITH INTRAOPERATIVE CHOLANGIOGRAM;  Surgeon: [name], MD;  Location: [facility];  Service: General;  Laterality: N/A;   EP IMPLANTABLE DEVICE N/A 06/03/2015   Procedure: PPM Generator Changeout;  Surgeon: [name], MD;  Location: [facility];  Service: Cardiovascular;  Laterality: N/A;   EXCISION BONE CYST Right 03/06/2015   Procedure: BONE BIOPSIES OF RIGHT FOOT;  Surgeon: [name], MD;  Location: [facility];  Service: Podiatry;  Laterality: Right;   ORIF ANKLE FRACTURE Left 11/06/2014   Procedure: OPEN REDUCTION INTERNAL FIXATION (ORIF) LEFT  ANKLE FRACTURE;  Surgeon: [name], MD;  Location: [facility];  Service: Orthopedics;  Laterality: Left;   TEE WITHOUT CARDIOVERSION N/A 09/01/2017   Procedure: TRANSESOPHAGEAL ECHOCARDIOGRAM (TEE);  Surgeon: [name], MD;  Location: [facility];  Service: Cardiovascular;  Laterality: N/A;   TOTAL KNEE ARTHROPLASTY     VEIN LIGATION AND STRIPPING          Home Medications    Prior to Admission medications   Medication Sig Start Date End Date Taking? Authorizing Provider  acetaminophen (TYLENOL) 325 MG tablet Take 1-2 tablets (325-650 mg total) by mouth every 4 (four) hours as needed for mild pain. 09/12/17  Yes Love, Ivan Anchors, PA-C  atorvastatin (LIPITOR) 10 MG tablet TAKE 1 TABLET BY MOUTH ONCE DAILY AT  6  PM Patient taking differently: Take 10 mg by mouth  daily at 6 PM.  09/18/18  Yes [name], MD  calcitRIOL (ROCALTROL) 0.25 MCG capsule Take 1 capsule by mouth once daily 09/13/18  Yes [name], MD  carvedilol (COREG) 25 MG tablet TAKE 1 TABLET BY MOUTH TWICE DAILY WITH A MEAL Patient taking differently: Take 25 mg by mouth 2 (two) times daily with a meal.  09/13/18  Yes [name], MD  cholecalciferol (VITAMIN D) 1000 units tablet Take 1 tablet (1,000 Units total) by mouth daily. 02/01/17  Yes [name], MD  clopidogrel (PLAVIX) 75 MG tablet Take 1 tablet by mouth once daily 09/13/18  Yes [name], MD  hydrALAZINE (APRESOLINE) 100 MG tablet Take 100 mg by mouth 3 (three) times daily. Take 1 tablet by mouth three times daily.   Yes [provider]  insulin regular (NOVOLIN R) 100 units/mL injection Inject 10-12  Units into the skin 3 (three) times daily before meals. Inject 12 units subcutaneously in the morning, then inject 12 units subcutaneously 30 mins before lunch, and then inject 10 units subcutaneously before supper   Yes [provider]  isosorbide mononitrate (IMDUR) 30 MG 24 hr tablet TAKE TWO TABLETS BY MOUTH ONCE DAILY Patient taking differently: Take 60 mg by mouth daily.  09/17/18  Yes [name], MD  liraglutide (VICTOZA) 18 MG/3ML SOPN Inject 0.3 mLs (1.8 mg total) into the skin daily before supper. Patient taking differently: Inject 1.2 mg into the skin daily before supper.  10/31/18  Yes Elayne Snare, MD  pantoprazole (PROTONIX) 40 MG tablet TAKE ONE TABLET BY MOUTH ONCE DAILY Patient taking differently: Take 40 mg by mouth daily.  02/07/17  Yes [name], FNP  sertraline (ZOLOFT) 100 MG tablet Take 100 mg by mouth daily.   Yes [provider]  vitamin B-12 (CYANOCOBALAMIN) 100 MCG tablet Take 100 mcg by mouth daily.   Yes [provider]  diclofenac sodium (VOLTAREN) 1 % GEL Apply 2 g topically 4 (four) times daily. Patient taking differently: Apply 2 g topically  3 (three) times daily.  11/11/18   Wieters, Hallie C, PA-C  glucose blood test strip Use Contour test strips as instructed to check blood sugar four times daily. 01/08/19   Elayne Snare, MD  insulin regular human CONCENTRATED (HUMULIN R) 500 UNIT/ML injection USE 0.25 ML DAILY OR AS DIRECTED IN INSULIN PUMP. DX:E11.65 Patient not taking: Reported on 01/09/2019 10/22/18   Elayne Snare, MD  Insulin Syringe-Needle U-100 (INSULIN SYRINGE 1CC/31GX5/16") 31G X 5/16" 1 ML MISC 1 each by Does not apply route 3 (three) times daily. Use insulin syringe to inject insulin three times daily. 01/08/19   Elayne Snare, MD  torsemide (DEMADEX) 20 MG tablet Take 2 tablets (40mg ) by mouth every OTHER day. Patient taking differently: Take 10 mg by mouth 2 (two) times daily.  01/03/19   [name], MD    Family History Family History  Problem Relation Age of Onset   Cancer Mother        breast   Heart attack Father    Stroke Father     Social History Social History   Tobacco Use   Smoking status: Former Smoker    Packs/day: 2.00    Years: 30.00    Pack years: 60.00    Quit date: 03/03/1984    Years since quitting: 34.8   Smokeless tobacco: Never Used  Substance Use Topics   Alcohol use: Yes    Alcohol/week: 1.0 standard drinks    Types: 1 Glasses of wine per week    Comment: social   Drug use: No     Allergies   Ativan [lorazepam] and Adhesive [tape]   Review of Systems Review of Systems  Neurological: Positive for weakness.  All other systems reviewed and are negative.    Physical Exam Updated Vital Signs BP (!) 140/49    Pulse (!) 59    Temp 98.4 F (36.9 C) (Oral)    Resp 16    SpO2 97%   Physical Exam Vitals signs and nursing note reviewed.  Constitutional:      Appearance: Normal appearance. He is obese.  HENT:     Head: Normocephalic and atraumatic.     Right Ear: External ear normal.     Left Ear: External ear normal.     Nose: Nose normal.     Mouth/Throat:  Mouth: Mucous membranes are moist.     Pharynx: Oropharynx is clear.  Eyes:     Extraocular Movements: Extraocular movements intact.     Conjunctiva/sclera: Conjunctivae normal.     Pupils: Pupils are equal, round, and reactive to light.  Neck:     Musculoskeletal: Normal range of motion and neck supple.  Cardiovascular:     Rate and Rhythm: Normal rate and regular rhythm.     Pulses: Normal pulses.     Heart sounds: Normal heart sounds.  Pulmonary:     Effort: Pulmonary effort is normal.     Breath sounds: Normal breath sounds.  Abdominal:     General: Abdomen is flat. Bowel sounds are normal.     Palpations: Abdomen is soft.  Musculoskeletal: Normal range of motion.  Skin:    General: Skin is warm.     Capillary Refill: Capillary refill takes less than 2 seconds.  Neurological:     Mental Status: He is alert. He is disoriented.  Psychiatric:        Mood and Affect: Mood normal.        Behavior: Behavior normal.        Thought Content: Thought content normal.        Judgment: Judgment normal.      ED Treatments / Results  Labs (all labs ordered are listed, but only abnormal results are displayed) Labs Reviewed  COMPREHENSIVE METABOLIC PANEL - Abnormal; Notable for the following components:      Result Value   BUN 69 (*)    Creatinine, Ser 2.22 (*)    GFR calc non Af Amer 27 (*)    GFR calc Af Amer 32 (*)    All other components within normal limits  CBC - Abnormal; Notable for the following components:   WBC 11.8 (*)    RBC 3.75 (*)    Hemoglobin 11.2 (*)    HCT 34.9 (*)    RDW 15.7 (*)    All other components within normal limits  URINALYSIS, ROUTINE W REFLEX MICROSCOPIC - Abnormal; Notable for the following components:   Color, Urine STRAW (*)    All other components within normal limits  CBG MONITORING, ED - Abnormal; Notable for the following components:   Glucose-Capillary 103 (*)    All other components within normal limits  CBG MONITORING, ED -  Abnormal; Notable for the following components:   Glucose-Capillary 68 (*)    All other components within normal limits  CBG MONITORING, ED - Abnormal; Notable for the following components:   Glucose-Capillary 110 (*)    All other components within normal limits  BLOOD GAS, ARTERIAL  CBG MONITORING, ED  CBG MONITORING, ED    EKG EKG Interpretation  Date/Time:  Wednesday January 09 2019 19:28:30 EDT Ventricular Rate:  122 PR Interval:    QRS Duration: 130 QT Interval:  238 QTC Calculation: 339 R Axis:   -37 Text Interpretation:  Demand pacemaker; interpretation is based on intrinsic rhythm Wide QRS tachycardia with Premature ventricular complexes or Fusion complexes Left axis deviation Non-specific intra-ventricular conduction block Cannot rule out Anteroseptal infarct , age undetermined Abnormal ECG No significant change since last tracing Confirmed by Isla Pence [phone]) on 01/09/2019 7:45:38 PM   Radiology Ct Head Wo Contrast  Result Date: 01/09/2019 CLINICAL DATA:  Altered level of consciousness, altered since Saturday EXAM: CT HEAD WITHOUT CONTRAST TECHNIQUE: Contiguous axial images were obtained from the base of the skull through the vertex without intravenous contrast. COMPARISON:  Imaging January 05, 2019 FINDINGS: Brain: No evidence of acute infarction, hemorrhage, hydrocephalus, extra-axial collection or mass lesion/mass effect. Symmetric prominence of the ventricles, cisterns and sulci compatible with parenchymal volume loss. Patchy areas of white matter hypoattenuation are most compatible with chronic microvascular angiopathy. Stable calcifications along the right cerebral convexity. Prominent retrocerebellar CSF space, similar to priors. Vascular: Intracranial atherosclerotic calcifications. No unexpected hyperdense vessel Skull: No calvarial fracture or suspicious osseous lesion. No scalp swelling or hematoma. Sinuses/Orbits: Orbital structures are unremarkable aside from  prior lens extractions. Paranasal sinuses and mastoid air cells are predominantly clear. Other: Edentulous with mandibular prognathism IMPRESSION: No acute intracranial abnormality. Stable parenchymal volume loss and chronic microvascular ischemic changes. Stable prominence of the retrocerebellar CSF space could reflect more focal cerebellar volume loss versus mega cisterna magna or arachnoid cyst. Electronically Signed   By: Lovena Le [name]   On: 01/09/2019 21:31   Dg Chest Port 1 View  Result Date: 01/09/2019 CLINICAL DATA:  Altered mental status. EXAM: PORTABLE CHEST 1 VIEW COMPARISON:  Chest x-ray dated January 05, 2019. FINDINGS: Unchanged left chest wall pacemaker. Stable cardiomegaly. Normal pulmonary vascularity. New opacity in the left lower lobe silhouetting the left hemidiaphragm. No pleural effusion or pneumothorax. No acute osseous abnormality. IMPRESSION: 1. New left lower lobe atelectasis versus infiltrate. Electronically Signed   By: Titus Dubin [name]   On: 01/09/2019 19:39    Procedures Procedures (including critical care time)  Medications Ordered in ED Medications  sodium chloride flush (NS) 0.9 % injection 3 mL (has no administration in time range)  sodium chloride flush (NS) 0.9 % injection 3 mL (has no administration in time range)  sodium chloride flush (NS) 0.9 % injection 3 mL (has no administration in time range)  0.9 %  sodium chloride infusion (has no administration in time range)  dextrose 50 % solution 50 mL (has no administration in time range)  dextrose 10 % infusion ( Intravenous New Bag/Given 01/09/19 2121)     Initial Impression / Assessment and Plan / ED Course  I have reviewed the triage vital signs and the nursing notes.  Pertinent labs & imaging results that were available during my care of the patient were reviewed by me and considered in my medical decision making (see chart for details).   Pt remained persistently hypoglycemic and required  several doses of dextrose.  I put him on a drip.  BP has improved with rest and no improvement in MS.  Labs show a mild AKI with slight worsening of his CKD.  He has a new lll atelectasis vs infiltrate, but clinically does not have pna.  No fever or cough.  Pt is not at his baseline, so he was d/w Dr. Marlowe Sax (triad) for admission.     CRITICAL CARE Performed by: Isla Pence   Total critical care time: 30 minutes  Critical care time was exclusive of separately billable procedures and treating other patients.  Critical care was necessary to treat or prevent imminent or life-threatening deterioration.  Critical care was time spent personally by me on the following activities: development of treatment plan with patient and/or surrogate as well as nursing, discussions with consultants, evaluation of patient's response to treatment, examination of patient, obtaining history from patient or surrogate, ordering and performing treatments and interventions, ordering and review of laboratory studies, ordering and review of radiographic studies, pulse oximetry and re-evaluation of patient's condition.  Final Clinical Impressions(s) / ED Diagnoses   Final diagnoses:  Hypoglycemia  Metabolic  encephalopathy  AKI (acute kidney injury) [facility])  Dehydration    ED Discharge Orders    None       Isla Pence, MD 01/09/19 2252

## 2019-01-09 NOTE — H&P (Signed)
History and Physical    David [name] EHM:[id number] DOB: [birth year] DOA: 01/09/2019  PCP: [name], MD Patient coming from: Home  Chief Complaint: Hypoglycemia, altered mental status  HPI: David [name] is a 78 y.o. male with medical history significant of SA node dysfunction status post PPM, previous CVA, BPH, CAD, CHF, CKD stage III-IV, HOCM, hypertension, hyperlipidemia, insulin-dependent diabetes mellitus with insulin pump, OSA on CPAP, peripheral vascular disease being brought to the hospital by EMS for evaluation of hyperglycemia and altered mental status. Per EMS report, family stated that the patient has been altered since Saturday.  She was seen at [facility] for a stroke.  Her CBG was 141 at lunch time, patient took insulin and CBG dropped to 35.  Patient became unconscious.  CBG 35 on EMS arrival.  EMS gave 1 amp of D10.  CBG 69 in route and EMS gave another 1 amp D10.  Blood pressure 218/70.  Patient was recently admitted 8/22-8/24 for acute neurologic changes.  Neurology was consulted and stroke was ruled out after doing work-up including head CT, brain MRI, and MRA head and neck.  His presentation was thought to be secondary to hypertensive encephalopathy.  EEG was negative for seizures.  Patient is oriented to person and place.  He knows it is August 2020 but does not know the exact date.  He is not sure why he is at the hospital.  Thinks maybe his blood sugar was too high.  History provided by daughter at bedside who states patient was recently admitted to the hospital as they thought he was having a stroke but after work-up it was determined that he was not having a stroke.  States since his hospital discharge he has not gotten any better.  He has been very weak, not eating much, and not walking.  His speech has been garbled.  His blood pressure has been high at home despite taking his medications.  His blood glucose has been high for the past few days but today it was low  despite patient eating a full lunch which was rich in carbohydrates.  States he home regimen includes 12 units of insulin in the morning with breakfast, 12 units with lunch, and 10 units with dinner.  Daughter does not know what dose of insulin patient took with lunch today.  States his insulin pump was turned off during his recent hospitalization and he has not been using it since then.  States he has been coughing and having shortness of breath recently.  ED Course: Blood pressure 161/52 on arrival, remainder of vitals stable.  CBG 103 on arrival, subsequently 68.  White count 11.8 and hemoglobin 11.2, no significant change since labs done 4 days ago.  BUN 69, creatinine 2.2 (recent baseline 1.7-1.8).  UA not suggestive of infection.  ABG with pH 7.38, PCO2 38, and PO2 87.  Chest x-ray showing new left lower lobe atelectasis versus infiltrate.  CT head negative for acute intracranial abnormality.  Review of Systems:  All systems reviewed and apart from history of presenting illness, are negative.  Past Medical History:  Diagnosis Date   Acute cholecystitis s/p lap cholecystectomy 03/05/2017 03/04/2017   Anemia, iron deficiency    Anxiety    Arthritis    Atrial fibrillation with rapid ventricular response (HCC)    BPH (benign prostatic hypertrophy)    CAD (coronary artery disease)    Nonobstructive CAD per cath   Cardiac pacemaker in situ    CHF (congestive heart failure) (  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 ([place]) 09/04/2017   History of cellulitis    right great toe 10-25-2014   History of skin  cancer    HOCM (hypertrophic obstructive cardiomyopathy) ([place])    Hyperlipidemia associated with type 2 diabetes mellitus ([place]) 10/25/2013   Hypertension    Hypertension associated with diabetes ([place]) 10/25/2013   IDDM (insulin dependent diabetes mellitus) - on insulin pump 03/04/2017   Insulin dependent type 2 diabetes mellitus ([place]) 1991   followd by [name]--  has insulin pump   Insulin pump in place    OSA on CPAP    Pacemaker 06/03/2015   Peripheral neuropathy    severe   Peripheral vascular disease ([place])    bilateral lower extremities   PVD (peripheral vascular disease) ([place])    Rib fracture 07/24/2015   Seasonal and perennial allergic rhinitis 10/25/2013   Seborrheic keratosis 09/19/2016   Secondary hyperparathyroidism of renal origin ([place])    Sinus node arrhythmia 06/03/2015   Sinus node dysfunction ([place])    Sleep apnea    Syncope 07/24/2015    Past Surgical History:  Procedure Laterality Date   AMPUTATION OF REPLICATED TOES  Mar 2878   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)   AMPUTATION TOE Right 03/12/2015   Procedure: RIGHT HALLUS AMPUTATION ;  Surgeon: [name], MD;  Location: [place];  Service: Podiatry;  Laterality: Right;   CARDIAC CATHETERIZATION  11-25-2010   [place], [place]   Nonobstructive CAD   CARDIAC PACEMAKER PLACEMENT  Nov 2009   [employer]   CHOLECYSTECTOMY N/A 03/05/2017   Procedure: LAPAROSCOPIC CHOLECYSTECTOMY WITH INTRAOPERATIVE CHOLANGIOGRAM;  Surgeon: [name], MD;  Location: [facility];  Service: General;  Laterality: N/A;   EP IMPLANTABLE DEVICE N/A 06/03/2015   Procedure: PPM Generator Changeout;  Surgeon: [name], MD;  Location: [place] [facility];  Service: Cardiovascular;  Laterality: N/A;   EXCISION BONE CYST Right 03/06/2015   Procedure: BONE BIOPSIES OF RIGHT FOOT;  Surgeon: [name], MD;  Location: [facility];  Service: Podiatry;  Laterality: Right;   ORIF ANKLE FRACTURE Left  11/06/2014   Procedure: OPEN REDUCTION INTERNAL FIXATION (ORIF) LEFT  ANKLE FRACTURE;  Surgeon: [name], MD;  Location: [facility];  Service: Orthopedics;  Laterality: Left;   TEE WITHOUT CARDIOVERSION N/A 09/01/2017   Procedure: TRANSESOPHAGEAL ECHOCARDIOGRAM (TEE);  Surgeon: [name], MD;  Location: [facility];  Service: Cardiovascular;  Laterality: N/A;   TOTAL KNEE ARTHROPLASTY     VEIN LIGATION AND STRIPPING       reports that he quit smoking about 34 years ago. He has a 60.00 pack-year smoking history. He has never used smokeless tobacco. He reports current alcohol use of about 1.0 standard drinks of alcohol per week. He reports that he does not use drugs.  Allergies  Allergen Reactions   Ativan [Lorazepam] Anxiety    Pt gets more agitated   Adhesive [Tape] Other (See Comments)    blisters    Family History  Problem Relation Age of Onset  Cancer Mother        breast   Heart attack Father    Stroke Father     Prior to Admission medications   Medication Sig Start Date End Date Taking? Authorizing Provider  acetaminophen (TYLENOL) 325 MG tablet Take 1-2 tablets (325-650 mg total) by mouth every 4 (four) hours as needed for mild pain. 09/12/17  Yes Love, Ivan Anchors, PA-C  atorvastatin (LIPITOR) 10 MG tablet TAKE 1 TABLET BY MOUTH ONCE DAILY AT  6  PM Patient taking differently: Take 10 mg by mouth daily at 6 PM.  09/18/18  Yes [name], MD  calcitRIOL (ROCALTROL) 0.25 MCG capsule Take 1 capsule by mouth once daily 09/13/18  Yes [name], MD  carvedilol (COREG) 25 MG tablet TAKE 1 TABLET BY MOUTH TWICE DAILY WITH A MEAL Patient taking differently: Take 25 mg by mouth 2 (two) times daily with a meal.  09/13/18  Yes [name], MD  cholecalciferol (VITAMIN D) 1000 units tablet Take 1 tablet (1,000 Units total) by mouth daily. 02/01/17  Yes [name], MD  clopidogrel (PLAVIX) 75 MG tablet Take 1 tablet by mouth once daily 09/13/18  Yes  [name], MD  hydrALAZINE (APRESOLINE) 100 MG tablet Take 100 mg by mouth 3 (three) times daily. Take 1 tablet by mouth three times daily.   Yes [provider]  insulin regular (NOVOLIN R) 100 units/mL injection Inject 10-12 Units into the skin 3 (three) times daily before meals. Inject 12 units subcutaneously in the morning, then inject 12 units subcutaneously 30 mins before lunch, and then inject 10 units subcutaneously before supper   Yes [provider]  isosorbide mononitrate (IMDUR) 30 MG 24 hr tablet TAKE TWO TABLETS BY MOUTH ONCE DAILY Patient taking differently: Take 60 mg by mouth daily.  09/17/18  Yes [name], MD  liraglutide (VICTOZA) 18 MG/3ML SOPN Inject 0.3 mLs (1.8 mg total) into the skin daily before supper. Patient taking differently: Inject 1.2 mg into the skin daily before supper.  10/31/18  Yes Elayne Snare, MD  pantoprazole (PROTONIX) 40 MG tablet TAKE ONE TABLET BY MOUTH ONCE DAILY Patient taking differently: Take 40 mg by mouth daily.  02/07/17  Yes [name], FNP  sertraline (ZOLOFT) 100 MG tablet Take 100 mg by mouth daily.   Yes [provider]  vitamin B-12 (CYANOCOBALAMIN) 100 MCG tablet Take 100 mcg by mouth daily.   Yes [provider]  diclofenac sodium (VOLTAREN) 1 % GEL Apply 2 g topically 4 (four) times daily. Patient taking differently: Apply 2 g topically 3 (three) times daily.  11/11/18   Wieters, Hallie C, PA-C  glucose blood test strip Use Contour test strips as instructed to check blood sugar four times daily. 01/08/19   Elayne Snare, MD  insulin regular human CONCENTRATED (HUMULIN R) 500 UNIT/ML injection USE 0.25 ML DAILY OR AS DIRECTED IN INSULIN PUMP. DX:E11.65 Patient not taking: Reported on 01/09/2019 10/22/18   Elayne Snare, MD  Insulin Syringe-Needle U-100 (INSULIN SYRINGE 1CC/31GX5/16") 31G X 5/16" 1 ML MISC 1 each by Does not apply route 3 (three) times daily. Use insulin syringe to inject insulin three  times daily. 01/08/19   Elayne Snare, MD  torsemide (DEMADEX) 20 MG tablet Take 2 tablets (40mg ) by mouth every OTHER day. Patient taking differently: Take 10 mg by mouth 2 (two) times daily.  01/03/19   [name], MD    Physical Exam: Vitals:  01/09/19 2209 01/09/19 2230 01/09/19 2300 01/09/19 2354  BP: (!) 158/55 (!) 140/49 126/82 (!) 150/44  Pulse: 62 (!) 59 (!) 59 (!) 59  Resp: 17 16 15 20   Temp:    98.2 F (36.8 C)  TempSrc:    Oral  SpO2: 97% 97% 95% 97%    Physical Exam  Constitutional: He is oriented to person, place, and time. He appears well-developed and well-nourished. No distress.  HENT:  Head: Normocephalic.  Slightly dry mucous membranes  Eyes: Pupils are equal, round, and reactive to light. Right eye exhibits no discharge. Left eye exhibits no discharge.  Neck: Neck supple.  Cardiovascular: Normal rate, regular rhythm and intact distal pulses.  Pulmonary/Chest: Effort normal. No respiratory distress. He has no wheezes. He has no rales.  Abdominal: Soft. Bowel sounds are normal. There is no abdominal tenderness. There is no rebound and no guarding.  Musculoskeletal:     Comments: Trace bilateral lower extremity edema  Neurological: He is alert and oriented to person, place, and time.  Speech fluent, tongue midline, no obvious facial droop Strength 5 out of 5 in bilateral upper and lower extremities. Sensation to light touch intact throughout.  Skin: Skin is warm and dry. He is not diaphoretic.  Venous stasis changes of bilateral lower extremities     Labs on Admission: I have personally reviewed following labs and imaging studies  CBC: Recent Labs  Lab 01/05/19 1434 01/09/19 1855 01/10/19 0015  WBC 11.2* 11.8* 10.7*  NEUTROABS 8.3*  --   --   HGB 11.6* 11.2* 9.9*  HCT 37.0* 34.9* 31.1*  MCV 94.4 93.1 93.7  PLT 224 221 536   Basic Metabolic Panel: Recent Labs  Lab 01/05/19 1434 01/09/19 1855 01/10/19 0015  NA 139 139 137  K 4.4 3.8 4.3    CL 107 105 106  CO2 22 23 21*  GLUCOSE 189* 79 132*  BUN 43* 69* 68*  CREATININE 1.85* 2.22* 2.21*  CALCIUM 9.3 9.4 8.7*   GFR: Estimated Creatinine Clearance: 40.8 mL/min (A) (by C-G formula based on SCr of 2.21 mg/dL (H)). Liver Function Tests: Recent Labs  Lab 01/05/19 1434 01/09/19 1855  AST 27 27  ALT 29 25  ALKPHOS 85 90  BILITOT 0.7 0.4  PROT 7.0 7.0  ALBUMIN 3.7 3.8   No results for input(s): LIPASE, AMYLASE in the last 168 hours. No results for input(s): AMMONIA in the last 168 hours. Coagulation Profile: Recent Labs  Lab 01/05/19 1434  INR 1.0   Cardiac Enzymes: No results for input(s): CKTOTAL, CKMB, CKMBINDEX, TROPONINI in the last 168 hours. BNP (last 3 results) No results for input(s): PROBNP in the last 8760 hours. HbA1C: No results for input(s): HGBA1C in the last 72 hours. CBG: Recent Labs  Lab 01/09/19 1848 01/09/19 2056 01/09/19 2243 01/09/19 2256 01/10/19 0029  GLUCAP 103* 68* 110* 108* 121*   Lipid Profile: No results for input(s): CHOL, HDL, LDLCALC, TRIG, CHOLHDL, LDLDIRECT in the last 72 hours. Thyroid Function Tests: No results for input(s): TSH, T4TOTAL, FREET4, T3FREE, THYROIDAB in the last 72 hours. Anemia Panel: No results for input(s): VITAMINB12, FOLATE, FERRITIN, TIBC, IRON, RETICCTPCT in the last 72 hours. Urine analysis:    Component Value Date/Time   COLORURINE STRAW (A) 01/09/2019 1900   APPEARANCEUR CLEAR 01/09/2019 1900   LABSPEC 1.009 01/09/2019 1900   PHURINE 5.0 01/09/2019 1900   GLUCOSEU NEGATIVE 01/09/2019 1900   GLUCOSEU NEGATIVE 03/03/2015 1501   HGBUR NEGATIVE 01/09/2019 1900   BILIRUBINUR  NEGATIVE 01/09/2019 1900   KETONESUR NEGATIVE 01/09/2019 1900   PROTEINUR NEGATIVE 01/09/2019 1900   UROBILINOGEN 0.2 03/03/2015 1501   NITRITE NEGATIVE 01/09/2019 1900   LEUKOCYTESUR NEGATIVE 01/09/2019 1900    Radiological Exams on Admission: Ct Head Wo Contrast  Result Date: 01/09/2019 CLINICAL DATA:  Altered  level of consciousness, altered since Saturday EXAM: CT HEAD WITHOUT CONTRAST TECHNIQUE: Contiguous axial images were obtained from the base of the skull through the vertex without intravenous contrast. COMPARISON:  Imaging January 05, 2019 FINDINGS: Brain: No evidence of acute infarction, hemorrhage, hydrocephalus, extra-axial collection or mass lesion/mass effect. Symmetric prominence of the ventricles, cisterns and sulci compatible with parenchymal volume loss. Patchy areas of white matter hypoattenuation are most compatible with chronic microvascular angiopathy. Stable calcifications along the right cerebral convexity. Prominent retrocerebellar CSF space, similar to priors. Vascular: Intracranial atherosclerotic calcifications. No unexpected hyperdense vessel Skull: No calvarial fracture or suspicious osseous lesion. No scalp swelling or hematoma. Sinuses/Orbits: Orbital structures are unremarkable aside from prior lens extractions. Paranasal sinuses and mastoid air cells are predominantly clear. Other: Edentulous with mandibular prognathism IMPRESSION: No acute intracranial abnormality. Stable parenchymal volume loss and chronic microvascular ischemic changes. Stable prominence of the retrocerebellar CSF space could reflect more focal cerebellar volume loss versus mega cisterna magna or arachnoid cyst. Electronically Signed   By: Lovena Le [name]   On: 01/09/2019 21:31   Dg Chest Port 1 View  Result Date: 01/09/2019 CLINICAL DATA:  Altered mental status. EXAM: PORTABLE CHEST 1 VIEW COMPARISON:  Chest x-ray dated January 05, 2019. FINDINGS: Unchanged left chest wall pacemaker. Stable cardiomegaly. Normal pulmonary vascularity. New opacity in the left lower lobe silhouetting the left hemidiaphragm. No pleural effusion or pneumothorax. No acute osseous abnormality. IMPRESSION: 1. New left lower lobe atelectasis versus infiltrate. Electronically Signed   By: Titus Dubin [name]   On: 01/09/2019 19:39    EKG:  Independently reviewed.  Paced rhythm.  Assessment/Plan Principal Problem:   Hypoglycemia Active Problems:   HTN (hypertension)   IDDM (insulin dependent diabetes mellitus) - on insulin pump   Acute metabolic encephalopathy   AKI (acute kidney injury) ([place])   Hypoglycemia in the setting of insulin-dependent diabetes mellitus CBG 35 per EMS.  Blood glucose continue to drop despite receiving a total of 2 ampules of D10.  Subsequently placed on D10 infusion in the ED. Per daughter, patient is using preprandial insulin as prescribed.  He has not been using his insulin pump since his recent hospitalization. -Continue D10 infusion at 75 cc/h -CBG checks every 2 hours -Hold home insulin -Regular diet  Acute metabolic encephalopathy Currently awake and alert.  Oriented to person and place.  Knows the month and year.  However, confused and does not know why he is at the hospital.  Likely related to hypoglycemia. Possibly also hypertensive encephalopathy.  Blood pressure elevated per EMS, however, improved on arrival to the ED without receiving any antihypertensives.  No focal neuro deficit on exam.  Head CT negative for acute intracranial abnormality.  UA not suggestive of infection. Patient was recently admitted 8/22-8/24 for acute neurologic changes.  Neurology was consulted and stroke was ruled out after doing work-up including head CT, brain MRI, and MRA head and neck.  His presentation was thought to be secondary to hypertensive encephalopathy.  EEG was negative for seizures. -Management of hypoglycemia as mentioned above -Monitor blood pressure closely -Patient will need a repeat brain MRI in a.m. if no improvement in mental status (has a pacemaker and  representative needs to be contacted)  ?CAP Afebrile.  Has mild leukocytosis, similar to labs done during recent hospitalization.  Not tachypneic or hypoxic.  Chest x-ray showing new left lower lobe atelectasis versus infiltrate. -Ceftriaxone  and azithromycin -Check procalcitonin level, if normal, discontinue antibiotics -Continuous pulse ox, supplemental oxygen if needed -Continue to monitor white count  Hypertension Blood pressure 218/70 by EMS, 161/52 on arrival without receiving any antihypertensive.  Systolic currently in the 140s to 150s. -Continue home Coreg, Imdur -Hydralazine PRN -Monitor blood pressure closely  AKI on CKD stage III-IV Creatinine 2.2 (recent baseline 1.7-1.8).  Appears slightly dehydrated. -IV fluid hydration -Continue to monitor renal function -Monitor urine output -Avoid nephrotoxic agents -Continue home calcitriol  OSA -CPAP at night  Hyperlipidemia -Continue Lipitor  Previous CVA -Continue home Plavix  DVT prophylaxis: Subcutaneous heparin Code Status: Full code.  Discussed with patient and daughter at bedside. Family Communication: Daughter updated at bedside. Disposition Plan: Anticipate discharge after clinical improvement. Consults called: None Admission status: It is my clinical opinion that referral for OBSERVATION is reasonable and necessary in this patient based on the above information provided. The aforementioned taken together are felt to place the patient at high risk for further clinical deterioration. However it is anticipated that the patient may be medically stable for discharge from the hospital within 24 to 48 hours.  The medical decision making on this patient was of high complexity and the patient is at high risk for clinical deterioration, therefore this is a level 3 visit.  Shela Leff MD [facility] Pager [phone]  If 7PM-7AM, please contact night-coverage [email or web address] Password [email or web address]  01/10/2019, 1:25 AM

## 2019-01-09 NOTE — Telephone Encounter (Signed)
Appointments changed for both patients appointments.

## 2019-01-09 NOTE — Telephone Encounter (Signed)
  Patient was referred to Dr Radford Pax by Dr Caryl Comes for sleep but the first available appt is 02/18/19 to which is wife stated he cannot wait that long. She would like to see if he can be worked in sooner.

## 2019-01-09 NOTE — ED Notes (Signed)
Gave report to [place] on [place]

## 2019-01-09 NOTE — ED Notes (Signed)
ED TO INPATIENT HANDOFF REPORT  ED Nurse Name and Phone #  Leafy Kindle, [phone]  S Name/Age/Gender David [name] 78 y.o. male Room/Bed: WA18/WA18  Code Status   Code Status: Prior  Home/SNF/Other Home  Patient oriented to: self and place  Is this baseline? Yes   Triage Complete: Triage complete  Chief Complaint Altered Mental Status; Hyperglycemia  Triage Note Patient arrived via [facility] from home.   Family states patient has been altered since last Saturday. [facility].   CBG- 141 at lunch at home. Patient took insulin and cbg dropped to 35.   Family called out for patient being unconscious and altered.   CBG-35 on arrival with ems   EMS gave 1 amp D10  CBG-171  Mental status did not improve.       CBG 69 in route EMS adminersted 1 am D10  Current cbg-103 in ED.   Patient is oriented to person and place and able to follow commands.   BP is now increasing.   BP-218/70 HR-60 Temp-98.7 RR-18   Patient told his family he thinks he might have fallen during the night.   Patient is not on blood thinners.    20g in right AC.       Allergies Allergies  Allergen Reactions  . Ativan [Lorazepam] Anxiety    Pt gets more agitated  . Adhesive [Tape] Other (See Comments)    blisters    Level of Care/Admitting Diagnosis ED Disposition    ED Disposition Condition Comment   Admit  Hospital Area: [facility] [[id number]]  Level of Care: Telemetry [5]  Admit to tele based on following criteria: Other see comments  Comments: Monitor hemodynamics  Covid Evaluation: Asymptomatic Screening Protocol (No Symptoms)  Diagnosis: AMS (altered mental status) [3664403]  Admitting Physician: Shela Leff [[id number]]  Attending Physician: Shela Leff [[id number]]  PT Class (Do Not Modify): Observation [104]  PT Acc Code (Do Not Modify): Observation [10022]       B Medical/Surgery History Past  Medical History:  Diagnosis Date  . Acute cholecystitis s/p lap cholecystectomy 03/05/2017 03/04/2017  . Anemia, iron deficiency   . Anxiety   . Arthritis   . Atrial fibrillation with rapid ventricular response ([place])   . BPH (benign prostatic hypertrophy)   . CAD (coronary artery disease)    Nonobstructive CAD per cath  . Cardiac pacemaker in situ   . CHF (congestive heart failure) ([place])   . Chronic ulcer of right foot ([place])   . CKD (chronic kidney disease) stage 4, GFR 15-29 ml/min (HCC) 09/26/2017  . CKD (chronic kidney disease), stage III ([place]) secondary to DM and HTN   nephrologist-  [name]  . Coronary artery disease involving native coronary artery of native heart without angina pectoris   . Diastolic dysfunction   . Dyspnea   . 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 History of cellulitis    right great toe 10-25-2014  . History of skin cancer   . HOCM (hypertrophic obstructive cardiomyopathy) ([place])   . Hyperlipidemia associated with type 2 diabetes mellitus ([place]) 10/25/2013  . Hypertension   . Hypertension associated with diabetes ([place]) 10/25/2013  . 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 Insulin pump in place   . OSA on CPAP   .  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 Peripheral neuropathy    severe  . Peripheral vascular disease ([place])    bilateral lower extremities  . PVD (peripheral vascular disease) ([place])   . Rib fracture 07/24/2015  . Seasonal and perennial allergic rhinitis 10/25/2013  . Seborrheic keratosis 09/19/2016  . Secondary hyperparathyroidism of renal origin ([place])   . 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 Sinus node dysfunction (H[facility])   . Sleep apnea   . Syncope 07/24/2015   Past Surgical History:  Procedure Laterality Date  . 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 AMPUTATION TOE Right 03/12/2015   Procedure: RIGHT HALLUS AMPUTATION ;  Surgeon: [name], MD;  Location: [facility];  Service: Podiatry;  Laterality: Right;  . CARDIAC CATHETERIZATION  11-25-2010   [place], [place]   Nonobstructive CAD  . CARDIAC PACEMAKER PLACEMENT  Nov 2009   [employer]  . CHOLECYSTECTOMY N/A 03/05/2017   Procedure: LAPAROSCOPIC CHOLECYSTECTOMY WITH INTRAOPERATIVE CHOLANGIOGRAM;  Surgeon: [name], MD;  Location: [facility];  Service: General;  Laterality: N/A;  . EP IMPLANTABLE DEVICE N/A 06/03/2015   Procedure: PPM Generator Changeout;  Surgeon: [name], MD;  Location: [facility];  Service: Cardiovascular;  Laterality: N/A;  . EXCISION BONE CYST Right 03/06/2015   Procedure: BONE BIOPSIES OF RIGHT FOOT;  Surgeon: [name], MD;  Location: [facility];  Service: Podiatry;  Laterality: Right;  . ORIF ANKLE FRACTURE Left 11/06/2014   Procedure: OPEN REDUCTION INTERNAL FIXATION (ORIF) LEFT  ANKLE FRACTURE;  Surgeon: [name], MD;  Location: [facility];  Service: Orthopedics;  Laterality: Left;  . TEE WITHOUT CARDIOVERSION N/A 09/01/2017   Procedure: TRANSESOPHAGEAL ECHOCARDIOGRAM (TEE);  Surgeon: [name], MD;  Location: [facility];  Service: Cardiovascular;  Laterality: N/A;  . TOTAL KNEE ARTHROPLASTY    . VEIN LIGATION AND STRIPPING       A IV Location/Drains/Wounds Patient Lines/Drains/Airways Status   Active Line/Drains/Airways    Name:   Placement date:   Placement time:   Site:   Days:   Peripheral IV 01/09/19 Right Antecubital   01/09/19    -    Antecubital   less than 1   Incision (Closed) 03/05/17 Abdomen Other (Comment)   03/05/17    1223     675   Incision - 4 Ports Abdomen 1: Right;Lateral 2: Right;Medial 3: Superior 4: Umbilicus   89/16/94    5038     675          Intake/Output Last 24 hours No intake or output data in the 24 hours ending 01/09/19  2310  Labs/Imaging Results for orders placed or performed during the hospital encounter of 01/09/19 (from the past 48 hour(s))  CBG monitoring, ED     Status: Abnormal   Collection Time: 01/09/19  6:48 PM  Result Value Ref Range   Glucose-Capillary 103 (H) 70 - 99 mg/dL  Comprehensive metabolic panel     Status: Abnormal   Collection Time: 01/09/19  6:55 PM  Result Value Ref Range   Sodium 139 135 - 145 mmol/L   Potassium 3.8 3.5 - 5.1 mmol/L   Chloride 105 98 - 111 mmol/L   CO2 23 22 - 32 mmol/L   Glucose, Bld 79 70 - 99 mg/dL   BUN 69 (H) 8 - 23 mg/dL   Creatinine, Ser 2.22 (H) 0.61 - 1.24 mg/dL   Calcium 9.4 8.9 - 10.3 mg/dL  Total Protein 7.0 6.5 - 8.1 g/dL   Albumin 3.8 3.5 - 5.0 g/dL   AST 27 15 - 41 U/L   ALT 25 0 - 44 U/L   Alkaline Phosphatase 90 38 - 126 U/L   Total Bilirubin 0.4 0.3 - 1.2 mg/dL   GFR calc non Af Amer 27 (L) >60 mL/min   GFR calc Af Amer 32 (L) >60 mL/min   Anion gap 11 5 - 15    Comment: Performed at [facility], [place] [address] [place], [place] [address]  CBC     Status: Abnormal   Collection Time: 01/09/19  6:55 PM  Result Value Ref Range   WBC 11.8 (H) 4.0 - 10.5 K/uL   RBC 3.75 (L) 4.22 - 5.81 MIL/uL   Hemoglobin 11.2 (L) 13.0 - 17.0 g/dL   HCT 34.9 (L) 39.0 - 52.0 %   MCV 93.1 80.0 - 100.0 fL   MCH 29.9 26.0 - 34.0 pg   MCHC 32.1 30.0 - 36.0 g/dL   RDW 15.7 (H) 11.5 - 15.5 %   Platelets 221 150 - 400 K/uL   nRBC 0.0 0.0 - 0.2 %    Comment: Performed at [facility], [place] [address], [place], [place] [address]  Urinalysis, Routine w reflex microscopic     Status: Abnormal   Collection Time: 01/09/19  7:00 PM  Result Value Ref Range   Color, Urine STRAW (A) YELLOW   APPearance CLEAR CLEAR   Specific Gravity, Urine 1.009 1.005 - 1.030   pH 5.0 5.0 - 8.0   Glucose, UA NEGATIVE NEGATIVE mg/dL   Hgb urine dipstick NEGATIVE NEGATIVE   Bilirubin Urine NEGATIVE NEGATIVE   Ketones, ur NEGATIVE NEGATIVE  mg/dL   Protein, ur NEGATIVE NEGATIVE mg/dL   Nitrite NEGATIVE NEGATIVE   Leukocytes,Ua NEGATIVE NEGATIVE    Comment: Performed at [facility] [address] [place], [place] [address]  Blood gas, arterial (at [facility])     Status: None   Collection Time: 01/09/19  7:39 PM  Result Value Ref Range   FIO2 21.00    pH, Arterial 7.386 7.350 - 7.450   pCO2 arterial 38.8 32.0 - 48.0 mmHg   pO2, Arterial 87.3 83.0 - 108.0 mmHg   Bicarbonate 22.8 20.0 - 28.0 mmol/L   Acid-base deficit 1.5 0.0 - 2.0 mmol/L   O2 Saturation 96.5 %   Patient temperature 98.4    Collection site RIGHT RADIAL    Drawn by [id number]    Sample type ARTERIAL DRAW    Allens test (pass/fail) PASS PASS    Comment: Performed at [facility], [place] [address], [place], [place] [address]  CBG monitoring, ED     Status: Abnormal   Collection Time: 01/09/19  8:56 PM  Result Value Ref Range   Glucose-Capillary 68 (L) 70 - 99 mg/dL  CBG monitoring, ED (now and then every hour for 3 hours)     Status: Abnormal   Collection Time: 01/09/19 10:43 PM  Result Value Ref Range   Glucose-Capillary 110 (H) 70 - 99 mg/dL  CBG monitoring, ED (now and then every hour for 3 hours)     Status: Abnormal   Collection Time: 01/09/19 10:56 PM  Result Value Ref Range   Glucose-Capillary 108 (H) 70 - 99 mg/dL   Ct Head Wo Contrast  Result Date: 01/09/2019 CLINICAL DATA:  Altered level of consciousness, altered since Saturday EXAM: CT HEAD WITHOUT CONTRAST TECHNIQUE:  Contiguous axial images were obtained from the base of the skull through the vertex without intravenous contrast. COMPARISON:  Imaging January 05, 2019 FINDINGS: Brain: No evidence of acute infarction, hemorrhage, hydrocephalus, extra-axial collection or mass lesion/mass effect. Symmetric prominence of the ventricles, cisterns and sulci compatible with parenchymal volume loss. Patchy areas of white matter hypoattenuation are most compatible with  chronic microvascular angiopathy. Stable calcifications along the right cerebral convexity. Prominent retrocerebellar CSF space, similar to priors. Vascular: Intracranial atherosclerotic calcifications. No unexpected hyperdense vessel Skull: No calvarial fracture or suspicious osseous lesion. No scalp swelling or hematoma. Sinuses/Orbits: Orbital structures are unremarkable aside from prior lens extractions. Paranasal sinuses and mastoid air cells are predominantly clear. Other: Edentulous with mandibular prognathism IMPRESSION: No acute intracranial abnormality. Stable parenchymal volume loss and chronic microvascular ischemic changes. Stable prominence of the retrocerebellar CSF space could reflect more focal cerebellar volume loss versus mega cisterna magna or arachnoid cyst. Electronically Signed   By: Lovena Le [name]   On: 01/09/2019 21:31   Dg Chest Port 1 View  Result Date: 01/09/2019 CLINICAL DATA:  Altered mental status. EXAM: PORTABLE CHEST 1 VIEW COMPARISON:  Chest x-ray dated January 05, 2019. FINDINGS: Unchanged left chest wall pacemaker. Stable cardiomegaly. Normal pulmonary vascularity. New opacity in the left lower lobe silhouetting the left hemidiaphragm. No pleural effusion or pneumothorax. No acute osseous abnormality. IMPRESSION: 1. New left lower lobe atelectasis versus infiltrate. Electronically Signed   By: Titus Dubin [name]   On: 01/09/2019 19:39    Pending Labs Unresulted Labs (From admission, onward)   None      Vitals/Pain Today's Vitals   01/09/19 2021 01/09/19 2030 01/09/19 2209 01/09/19 2230  BP: (!) 166/52 (!) 131/50 (!) 158/55 (!) 140/49  Pulse: 64 (!) 59 62 (!) 59  Resp: 20 15 17 16   Temp:      TempSrc:      SpO2: 97% 98% 97% 97%    Isolation Precautions No active isolations  Medications Medications  sodium chloride flush (NS) 0.9 % injection 3 mL (has no administration in time range)  sodium chloride flush (NS) 0.9 % injection 3 mL (has no  administration in time range)  sodium chloride flush (NS) 0.9 % injection 3 mL (has no administration in time range)  0.9 %  sodium chloride infusion (has no administration in time range)  dextrose 50 % solution 50 mL (has no administration in time range)  dextrose 10 % infusion ( Intravenous New Bag/Given 01/09/19 2121)    Mobility walks High fall risk     Report given to: [place]

## 2019-01-10 ENCOUNTER — Observation Stay (HOSPITAL_COMMUNITY): Payer: Medicare Other

## 2019-01-10 ENCOUNTER — Ambulatory Visit: Payer: Medicare Other | Admitting: Family Medicine

## 2019-01-10 ENCOUNTER — Telehealth: Payer: Self-pay | Admitting: Family Medicine

## 2019-01-10 ENCOUNTER — Other Ambulatory Visit: Payer: Self-pay

## 2019-01-10 DIAGNOSIS — I509 Heart failure, unspecified: Secondary | ICD-10-CM | POA: Diagnosis present

## 2019-01-10 DIAGNOSIS — E1151 Type 2 diabetes mellitus with diabetic peripheral angiopathy without gangrene: Secondary | ICD-10-CM | POA: Diagnosis present

## 2019-01-10 DIAGNOSIS — E11649 Type 2 diabetes mellitus with hypoglycemia without coma: Secondary | ICD-10-CM | POA: Diagnosis present

## 2019-01-10 DIAGNOSIS — I2510 Atherosclerotic heart disease of native coronary artery without angina pectoris: Secondary | ICD-10-CM | POA: Diagnosis present

## 2019-01-10 DIAGNOSIS — N400 Enlarged prostate without lower urinary tract symptoms: Secondary | ICD-10-CM | POA: Diagnosis present

## 2019-01-10 DIAGNOSIS — G4733 Obstructive sleep apnea (adult) (pediatric): Secondary | ICD-10-CM | POA: Diagnosis present

## 2019-01-10 DIAGNOSIS — G9341 Metabolic encephalopathy: Secondary | ICD-10-CM

## 2019-01-10 DIAGNOSIS — E162 Hypoglycemia, unspecified: Secondary | ICD-10-CM

## 2019-01-10 DIAGNOSIS — Z7902 Long term (current) use of antithrombotics/antiplatelets: Secondary | ICD-10-CM | POA: Diagnosis not present

## 2019-01-10 DIAGNOSIS — E1142 Type 2 diabetes mellitus with diabetic polyneuropathy: Secondary | ICD-10-CM | POA: Diagnosis present

## 2019-01-10 DIAGNOSIS — I421 Obstructive hypertrophic cardiomyopathy: Secondary | ICD-10-CM | POA: Diagnosis present

## 2019-01-10 DIAGNOSIS — E860 Dehydration: Secondary | ICD-10-CM | POA: Diagnosis present

## 2019-01-10 DIAGNOSIS — I4891 Unspecified atrial fibrillation: Secondary | ICD-10-CM | POA: Diagnosis present

## 2019-01-10 DIAGNOSIS — Z794 Long term (current) use of insulin: Secondary | ICD-10-CM | POA: Diagnosis not present

## 2019-01-10 DIAGNOSIS — I130 Hypertensive heart and chronic kidney disease with heart failure and stage 1 through stage 4 chronic kidney disease, or unspecified chronic kidney disease: Secondary | ICD-10-CM | POA: Diagnosis present

## 2019-01-10 DIAGNOSIS — Z809 Family history of malignant neoplasm, unspecified: Secondary | ICD-10-CM | POA: Diagnosis not present

## 2019-01-10 DIAGNOSIS — Z8673 Personal history of transient ischemic attack (TIA), and cerebral infarction without residual deficits: Secondary | ICD-10-CM | POA: Diagnosis not present

## 2019-01-10 DIAGNOSIS — Z20828 Contact with and (suspected) exposure to other viral communicable diseases: Secondary | ICD-10-CM | POA: Diagnosis present

## 2019-01-10 DIAGNOSIS — Z950 Presence of cardiac pacemaker: Secondary | ICD-10-CM | POA: Diagnosis not present

## 2019-01-10 DIAGNOSIS — E1169 Type 2 diabetes mellitus with other specified complication: Secondary | ICD-10-CM | POA: Diagnosis present

## 2019-01-10 DIAGNOSIS — E1122 Type 2 diabetes mellitus with diabetic chronic kidney disease: Secondary | ICD-10-CM | POA: Diagnosis present

## 2019-01-10 DIAGNOSIS — E785 Hyperlipidemia, unspecified: Secondary | ICD-10-CM | POA: Diagnosis present

## 2019-01-10 DIAGNOSIS — R0602 Shortness of breath: Secondary | ICD-10-CM | POA: Diagnosis not present

## 2019-01-10 DIAGNOSIS — G4730 Sleep apnea, unspecified: Secondary | ICD-10-CM | POA: Diagnosis present

## 2019-01-10 DIAGNOSIS — N179 Acute kidney failure, unspecified: Secondary | ICD-10-CM

## 2019-01-10 DIAGNOSIS — N183 Chronic kidney disease, stage 3 (moderate): Secondary | ICD-10-CM | POA: Diagnosis present

## 2019-01-10 LAB — GLUCOSE, CAPILLARY
Glucose-Capillary: 121 mg/dL — ABNORMAL HIGH (ref 70–99)
Glucose-Capillary: 131 mg/dL — ABNORMAL HIGH (ref 70–99)
Glucose-Capillary: 137 mg/dL — ABNORMAL HIGH (ref 70–99)
Glucose-Capillary: 138 mg/dL — ABNORMAL HIGH (ref 70–99)
Glucose-Capillary: 142 mg/dL — ABNORMAL HIGH (ref 70–99)
Glucose-Capillary: 234 mg/dL — ABNORMAL HIGH (ref 70–99)
Glucose-Capillary: 330 mg/dL — ABNORMAL HIGH (ref 70–99)
Glucose-Capillary: 341 mg/dL — ABNORMAL HIGH (ref 70–99)
Glucose-Capillary: 353 mg/dL — ABNORMAL HIGH (ref 70–99)

## 2019-01-10 LAB — CBC
HCT: 31.1 % — ABNORMAL LOW (ref 39.0–52.0)
Hemoglobin: 9.9 g/dL — ABNORMAL LOW (ref 13.0–17.0)
MCH: 29.8 pg (ref 26.0–34.0)
MCHC: 31.8 g/dL (ref 30.0–36.0)
MCV: 93.7 fL (ref 80.0–100.0)
Platelets: 207 10*3/uL (ref 150–400)
RBC: 3.32 MIL/uL — ABNORMAL LOW (ref 4.22–5.81)
RDW: 15.7 % — ABNORMAL HIGH (ref 11.5–15.5)
WBC: 10.7 10*3/uL — ABNORMAL HIGH (ref 4.0–10.5)
nRBC: 0 % (ref 0.0–0.2)

## 2019-01-10 LAB — BASIC METABOLIC PANEL
Anion gap: 10 (ref 5–15)
BUN: 68 mg/dL — ABNORMAL HIGH (ref 8–23)
CO2: 21 mmol/L — ABNORMAL LOW (ref 22–32)
Calcium: 8.7 mg/dL — ABNORMAL LOW (ref 8.9–10.3)
Chloride: 106 mmol/L (ref 98–111)
Creatinine, Ser: 2.21 mg/dL — ABNORMAL HIGH (ref 0.61–1.24)
GFR calc Af Amer: 32 mL/min — ABNORMAL LOW (ref >60)
GFR calc non Af Amer: 28 mL/min — ABNORMAL LOW (ref >60)
Glucose, Bld: 132 mg/dL — ABNORMAL HIGH (ref 70–99)
Potassium: 4.3 mmol/L (ref 3.5–5.1)
Sodium: 137 mmol/L (ref 135–145)

## 2019-01-10 LAB — SARS CORONAVIRUS 2 (TAT 6-24 HRS): SARS Coronavirus 2: NEGATIVE

## 2019-01-10 LAB — PROCALCITONIN: Procalcitonin: <0.1 ng/mL

## 2019-01-10 MED ORDER — INSULIN GLARGINE 100 UNIT/ML ~~LOC~~ SOLN
30.0000 [IU] | Freq: Two times a day (BID) | SUBCUTANEOUS | Status: DC
  Administered 2019-01-10 – 2019-01-12 (×4): 30 [IU] via SUBCUTANEOUS
  Filled 2019-01-10 (×5): qty 0.3

## 2019-01-10 MED ORDER — INSULIN ASPART 100 UNIT/ML ~~LOC~~ SOLN
4.0000 [IU] | Freq: Three times a day (TID) | SUBCUTANEOUS | Status: DC
  Administered 2019-01-10: 4 [IU] via SUBCUTANEOUS

## 2019-01-10 MED ORDER — HYDRALAZINE HCL 20 MG/ML IJ SOLN: 5.0000 mg | INTRAMUSCULAR | Status: DC | PRN

## 2019-01-10 MED ORDER — HYDRALAZINE HCL 50 MG PO TABS
100.0000 mg | ORAL_TABLET | Freq: Three times a day (TID) | ORAL | Status: DC
  Administered 2019-01-10 – 2019-01-12 (×6): 100 mg via ORAL
  Filled 2019-01-10 (×6): qty 2

## 2019-01-10 MED ORDER — SODIUM CHLORIDE 0.9 % IV SOLN
INTRAVENOUS | Status: DC
  Administered 2019-01-10: 01:00:00 via INTRAVENOUS

## 2019-01-10 MED ORDER — INSULIN ASPART 100 UNIT/ML ~~LOC~~ SOLN
5.0000 [IU] | Freq: Three times a day (TID) | SUBCUTANEOUS | Status: DC
  Administered 2019-01-11 – 2019-01-12 (×5): 5 [IU] via SUBCUTANEOUS

## 2019-01-10 MED ORDER — INSULIN ASPART 100 UNIT/ML ~~LOC~~ SOLN
0.0000 [IU] | SUBCUTANEOUS | Status: DC
  Administered 2019-01-10: 16:00:00 7 [IU] via SUBCUTANEOUS
  Administered 2019-01-10: 9 [IU] via SUBCUTANEOUS
  Administered 2019-01-10 – 2019-01-11 (×3): 3 [IU] via SUBCUTANEOUS

## 2019-01-10 MED ORDER — LACTATED RINGERS IV SOLN
INTRAVENOUS | Status: AC
  Administered 2019-01-10 – 2019-01-11 (×2): via INTRAVENOUS

## 2019-01-10 MED ORDER — INSULIN GLARGINE 100 UNIT/ML ~~LOC~~ SOLN
10.0000 [IU] | Freq: Every day | SUBCUTANEOUS | Status: DC
  Filled 2019-01-10: qty 0.1

## 2019-01-10 NOTE — Telephone Encounter (Signed)
Notified. 

## 2019-01-10 NOTE — Progress Notes (Signed)
PROGRESS NOTE    David [name]  KLK:[id number] DOB: 08/13/1940 DOA: 01/09/2019 PCP: [name], MD   Brief Narrative:  David [name] is David [name] 78 y.o. male with medical history significant of SA node dysfunction status post PPM, previous CVA, BPH, CAD, CHF, CKD stage III-IV, HOCM, hypertension, hyperlipidemia, insulin-dependent diabetes mellitus with insulin pump, OSA on CPAP, peripheral vascular disease being brought to the hospital by EMS for evaluation of hyperglycemia and altered mental status. Per EMS report, family stated that the patient has been altered since Saturday.  She was seen at [facility] for David [name] stroke.  Her CBG was 141 at lunch time, patient took insulin and CBG dropped to 35.  Patient became unconscious.  CBG 35 on EMS arrival.  EMS gave 1 amp of D10.  CBG 69 in route and EMS gave another 1 amp D10.  Blood pressure 218/70.  Patient was recently admitted 8/22-8/24 for acute neurologic changes.  Neurology was consulted and stroke was ruled out after doing work-up including head CT, brain MRI, and MRA head and neck.  His presentation was thought to be secondary to hypertensive encephalopathy.  EEG was negative for seizures.  Patient is oriented to person and place.  He knows it is August 2020 but does not know the exact date.  He is not sure why he is at the hospital.  Thinks maybe his blood sugar was too high.  History provided by daughter at bedside who states patient was recently admitted to the hospital as they thought he was having Delsa Walder stroke but after work-up it was determined that he was not having David [name] stroke.  States since his hospital discharge he has not gotten any better.  He has been very weak, not eating much, and not walking.  His speech has been garbled.  His blood pressure has been high at home despite taking his medications.  His blood glucose has been high for the past few days but today it was low despite patient eating [name] full lunch which was rich in carbohydrates.  States he  home regimen includes 12 units of insulin in the morning with breakfast, 12 units with lunch, and 10 units with dinner.  Daughter does not know what dose of insulin patient took with lunch today.  States his insulin pump was turned off during his recent hospitalization and he has not been using it since then.  States he has been coughing and having shortness of breath recently.  ED Course: Blood pressure 161/52 on arrival, remainder of vitals stable.  CBG 103 on arrival, subsequently 68.  White count 11.8 and hemoglobin 11.2, no significant change since labs done 4 days ago.  BUN 69, creatinine 2.2 (recent baseline 1.7-1.8).  UA not suggestive of infection.  ABG with pH 7.38, PCO2 38, and PO2 87.  Chest x-ray showing new left lower lobe atelectasis versus infiltrate.  CT head negative for acute intracranial abnormality.    Assessment & Plan:   Principal Problem:   Hypoglycemia Active Problems:   HTN (hypertension)   IDDM (insulin dependent diabetes mellitus) - on insulin pump   Acute metabolic encephalopathy   AKI (acute kidney injury) ([place])  Hypoglycemia in the setting of insulin-dependent diabetes mellitus   T2DM BG now elevated.  Start SSI. Stop D10. He'd been d/c'd off his pump last visit due to concern he'd be able to manage it and is now only using bolus insulin. Will plan to start basal bolus regimen and monitor closely for recurrent hypoglycemia.  Will uptitrate as tolerated. CBG 35 per EMS.  Blood glucose continue to drop despite receiving [name] total of 2 ampules of D10.  Acute metabolic encephalopathy Currently awake and alert x2 (some confusion around month and day of the week).  He feels better, his wife notes he seems better, but still tired.  Suspect this was all related to hypoglycemia above given his improvement now.  CT without acute abnormality ( Continue to monitor.   Possibly also hypertensive encephalopathy (this was presumed at last hospitalization), continue to follow  BP closely With improvement will hold off on repeat imaging at this time.  Consider further w/u if he doesn't continue to improve.  ?CAP Repeat CXR this AM with mild L lung base hypoventilation.  Procalcitonin negative.  Discontinue abx.  Continue to monitor.   Hypertension Continue home BP meds  AKI on CKD stage III-IV Creatinine 2.2 (recent baseline 1.7-1.8, but has ranged from 1.9-2.4 over the past year or so).  - Creatinine relatively stable today, continue IVF -Continue to monitor renal function -Monitor urine output -Avoid nephrotoxic agents -Continue home calcitriol  OSA -CPAP at night  Hyperlipidemia -Continue Lipitor  Previous CVA -Continue home Plavix and lipitor  Repeat covid screening test  DVT prophylaxis: heparin Code Status: full  Family Communication: wife at bedside Disposition Plan: pending further improvement.  Inpatient given admission for hypoglycemia, now improved, but need to ensure no recurrent hypoglycemia off D10 IV fluids and when resuming insulin.   Consultants:   none  Procedures:   none  Antimicrobials:  Anti-infectives (From admission, onward)   Start     Dose/Rate Route Frequency Ordered Stop   01/09/19 2345  cefTRIAXone (ROCEPHIN) 1 g in sodium chloride 0.9 % 100 mL IVPB  Status:  Discontinued     1 g 200 mL/hr over 30 Minutes Intravenous Daily at bedtime 01/09/19 2339 01/10/19 1446   01/09/19 2345  azithromycin (ZITHROMAX) 500 mg in sodium chloride 0.9 % 250 mL IVPB  Status:  Discontinued     500 mg 250 mL/hr over 60 Minutes Intravenous Daily at bedtime 01/09/19 2339 01/10/19 1446      Subjective: David [name]&Ox2 (said august or sept, didn't know day of week) Feeling better.  Doesn't remember what happened yesterday. Wife notes he'd been off since discharge.  They were having to feed him.  Had incontinence.  Now seems tired, but improved.   Objective: Vitals:   01/09/19 2354 01/10/19 0549 01/10/19 1000 01/10/19 1421  BP: (!)  150/44 (!) 129/109 (!) 142/46 (!) 108/42  Pulse: (!) 59 61  (!) 59  Resp: 20 16  20   Temp: 98.2 F (36.8 C) (!) 97.4 F (36.3 C)  98.2 F (36.8 C)  TempSrc: Oral Oral  Oral  SpO2: 97% 98%  96%    Intake/Output Summary (Last 24 hours) at 01/10/2019 1441 Last data filed at 01/10/2019 0916 Gross per 24 hour  Intake 240 ml  Output 360 ml  Net -120 ml   There were no vitals filed for this visit.  Examination:  General exam: Appears calm and comfortable  Respiratory system: Clear to auscultation. Respiratory effort normal. Cardiovascular system: S1 & S2 heard, RRR Gastrointestinal system: Abdomen is nondistended, soft and nontender.  Central nervous system: Alert and oriented x2. residual L sided facial droop from previous stroke.  No new focal neurologic deficits.  Moving all extremities with equal strength.  Extremities: no LEE Skin: No rashes, lesions or ulcers Psychiatry: Judgement and insight appear normal. Mood & affect appropriate.  Data Reviewed: I have personally reviewed following labs and imaging studies  CBC: Recent Labs  Lab 01/05/19 1434 01/09/19 1855 01/10/19 0015  WBC 11.2* 11.8* 10.7*  NEUTROABS 8.3*  --   --   HGB 11.6* 11.2* 9.9*  HCT 37.0* 34.9* 31.1*  MCV 94.4 93.1 93.7  PLT 224 221 379   Basic Metabolic Panel: Recent Labs  Lab 01/05/19 1434 01/09/19 1855 01/10/19 0015  NA 139 139 137  K 4.4 3.8 4.3  CL 107 105 106  CO2 22 23 21*  GLUCOSE 189* 79 132*  BUN 43* 69* 68*  CREATININE 1.85* 2.22* 2.21*  CALCIUM 9.3 9.4 8.7*   GFR: Estimated Creatinine Clearance: 40.8 mL/min (Giannis Corpuz) (by C-G formula based on SCr of 2.21 mg/dL (H)). Liver Function Tests: Recent Labs  Lab 01/05/19 1434 01/09/19 1855  AST 27 27  ALT 29 25  ALKPHOS 85 90  BILITOT 0.7 0.4  PROT 7.0 7.0  ALBUMIN 3.7 3.8   No results for input(s): LIPASE, AMYLASE in the last 168 hours. No results for input(s): AMMONIA in the last 168 hours. Coagulation Profile: Recent Labs   Lab 01/05/19 1434  INR 1.0   Cardiac Enzymes: No results for input(s): CKTOTAL, CKMB, CKMBINDEX, TROPONINI in the last 168 hours. BNP (last 3 results) No results for input(s): PROBNP in the last 8760 hours. HbA1C: No results for input(s): HGBA1C in the last 72 hours. CBG: Recent Labs  Lab 01/10/19 0406 01/10/19 0551 01/10/19 0753 01/10/19 0956 01/10/19 1157  GLUCAP 131* 138* 137* 330* 353*   Lipid Profile: No results for input(s): CHOL, HDL, LDLCALC, TRIG, CHOLHDL, LDLDIRECT in the last 72 hours. Thyroid Function Tests: No results for input(s): TSH, T4TOTAL, FREET4, T3FREE, THYROIDAB in the last 72 hours. Anemia Panel: No results for input(s): VITAMINB12, FOLATE, FERRITIN, TIBC, IRON, RETICCTPCT in the last 72 hours. Sepsis Labs: Recent Labs  Lab 01/10/19 0015  PROCALCITON <0.10    Recent Results (from the past 240 hour(s))  Novel Coronavirus, NAA (hospital order; send-out to ref lab)     Status: Abnormal   Collection Time: 01/05/19  5:24 PM   Specimen: Nasopharyngeal Swab; Respiratory  Result Value Ref Range Status   SARS-CoV-2, NAA QUANTITY NOT SUFFICIENT, UNABLE TO PERFORM TEST (Aren Pryde) NOT DETECTED Final    Comment: (NOTE) Quantity was not sufficient for analysis.      Shyrl Numbers notified 01/08/2019 Performed At: [facility] [address] [place], [name] [id number] Rush Farmer MD GD:[id number]    Coronavirus Source NASOPHARYNGEAL  Final    Comment: Performed at [facility], [place] [address] [place], [place] [address]         Radiology Studies: Dg Chest 2 View  Result Date: 01/10/2019 CLINICAL DATA:  78 year old male with weakness and shortness of breath. Altered mental status. EXAM: CHEST - 2 VIEW COMPARISON:  01/09/2019 and earlier. FINDINGS: Semi upright AP and lateral views of the chest. Stable left chest cardiac pacemaker. Stable cardiomegaly with evidence of right atrial enlargement. Mediastinal contours are stable since 2016. Mild hypo  ventilation at the left lung base also appears stable since 2016 with no pneumothorax, pulmonary edema, pleural effusion or confluent opacity. Flowing endplate osteophytes in the spine. No acute osseous abnormality identified. Paucity of bowel gas in the upper abdomen. IMPRESSION: Chronic cardiomegaly with mild left lung base hypo ventilation. No acute cardiopulmonary abnormality. Electronically Signed   By: Genevie Ann [name]   On: 01/10/2019 12:44   Ct Head Wo Contrast  Result  Date: 01/09/2019 CLINICAL DATA:  Altered level of consciousness, altered since Saturday EXAM: CT HEAD WITHOUT CONTRAST TECHNIQUE: Contiguous axial images were obtained from the base of the skull through the vertex without intravenous contrast. COMPARISON:  Imaging January 05, 2019 FINDINGS: Brain: No evidence of acute infarction, hemorrhage, hydrocephalus, extra-axial collection or mass lesion/mass effect. Symmetric prominence of the ventricles, cisterns and sulci compatible with parenchymal volume loss. Patchy areas of white matter hypoattenuation are most compatible with chronic microvascular angiopathy. Stable calcifications along the right cerebral convexity. Prominent retrocerebellar CSF space, similar to priors. Vascular: Intracranial atherosclerotic calcifications. No unexpected hyperdense vessel Skull: No calvarial fracture or suspicious osseous lesion. No scalp swelling or hematoma. Sinuses/Orbits: Orbital structures are unremarkable aside from prior lens extractions. Paranasal sinuses and mastoid air cells are predominantly clear. Other: Edentulous with mandibular prognathism IMPRESSION: No acute intracranial abnormality. Stable parenchymal volume loss and chronic microvascular ischemic changes. Stable prominence of the retrocerebellar CSF space could reflect more focal cerebellar volume loss versus mega cisterna magna or arachnoid cyst. Electronically Signed   By: Lovena Le [name]   On: 01/09/2019 21:31   Dg Chest Port 1  View  Result Date: 01/09/2019 CLINICAL DATA:  Altered mental status. EXAM: PORTABLE CHEST 1 VIEW COMPARISON:  Chest x-ray dated January 05, 2019. FINDINGS: Unchanged left chest wall pacemaker. Stable cardiomegaly. Normal pulmonary vascularity. New opacity in the left lower lobe silhouetting the left hemidiaphragm. No pleural effusion or pneumothorax. No acute osseous abnormality. IMPRESSION: 1. New left lower lobe atelectasis versus infiltrate. Electronically Signed   By: Titus Dubin [name]   On: 01/09/2019 19:39        Scheduled Meds:  atorvastatin  10 mg Oral q1800   calcitRIOL  0.25 mcg Oral Daily   carvedilol  25 mg Oral BID WC   cholecalciferol  1,000 Units Oral Daily   clopidogrel  75 mg Oral Daily   dextrose  1 ampule Intravenous Once   heparin  5,000 Units Subcutaneous Q8H   hydrALAZINE  100 mg Oral TID   insulin aspart  0-9 Units Subcutaneous Q4H   insulin aspart  4 Units Subcutaneous TID WC   insulin glargine  10 Units Subcutaneous QHS   isosorbide mononitrate  60 mg Oral Daily   pantoprazole  40 mg Oral Daily   sertraline  100 mg Oral Daily   sodium chloride flush  3 mL Intravenous Once   sodium chloride flush  3 mL Intravenous Q12H   vitamin B-12  100 mcg Oral Daily   Continuous Infusions:  sodium chloride     azithromycin 500 mg (01/10/19 0120)   cefTRIAXone (ROCEPHIN)  IV 1 g (01/10/19 0030)     LOS: 0 days    Time spent: over 30 min    Fayrene Helper, MD [facility] Pager AMION  If 7PM-7AM, please contact night-coverage [email or web address] Password [facility] 01/10/2019, 2:41 PM

## 2019-01-10 NOTE — Telephone Encounter (Signed)
Copied from [place] [phone]. Topic: Quick Communication - Home Health Verbal Orders >> Jan 10, 2019  4:34 PM Leward Quan A wrote: Caller/Agency: Tommi Emery / Alvis Lemmings Callback Number: [phone] ok to LM Requesting OT/PT/Skilled Nursing/Social Work/Speech Therapy: OT Frequency: 1 x wk 2 wks, 0 x wk 1 wk, 1 x wk 3 wks

## 2019-01-10 NOTE — Telephone Encounter (Signed)
Ok with me. Please place any necessary orders. 

## 2019-01-10 NOTE — Progress Notes (Signed)
Inpatient Diabetes Program Recommendations  AACE/ADA: New Consensus Statement on Inpatient Glycemic Control (2015)  Target Ranges:  Prepandial:   less than 140 mg/dL      Peak postprandial:   less than 180 mg/dL (1-2 hours)      Critically ill patients:  140 - 180 mg/dL   Lab Results  Component Value Date   GLUCAP 341 (H) 01/10/2019   HGBA1C 7.4 (H) 01/06/2019    Review of Glycemic Control  Diabetes history: DM2 Outpatient Diabetes medications: Per Dr. Dwyane Dee on 8/25: U-500 12 units on waking up, 12 units 30 min before Lunch and 10 units approx 30 minutes before Dinner, Victoza 1.2 mg QD before supper Current orders for Inpatient glycemic control: Lantus 10 units QHS, Novolog 0-9 units Q4H + 4 units tidwc  HgbA1C - 7.4% (likely have a lot of lows) Pt has been taking U-500 04-26-09 units tidwc, which equates to 60-60-50 units U-100, although U-500 has different profile than R insulin.  At [facility] during previous hospitalization, pt was on Lantus 35 units bid, Novolog 5 units tidwc and 0-15 units Q4H.  Inpatient Diabetes Program Recommendations:     Lantus 30 units bid Novolog 5 units tidwc for meal coverage insulin if pt eats > 50% meal Increase Novolog to 0-15 units tidwc and hs  Will follow closely.  Thank you. Lorenda Peck, RD, LDN, CDE Inpatient Diabetes Coordinator [phone]

## 2019-01-10 NOTE — Progress Notes (Signed)
Patient scheduled for TCM today but was not seen due to being currently hospitalized.  [name], MD 01/10/2019 11:34 AM

## 2019-01-10 NOTE — Evaluation (Signed)
Physical Therapy Evaluation Patient Details Name: David [name] MRN: [id number] DOB: 1940/12/25 Today's Date: 01/10/2019   History of Present Illness  78 yo male admitted to ED on 8/26 with hypogylcemia, AMS, fall. Pt admitted to [facility] 8/22-8/24 for code stroke, stroke work up negative. PMH includes lap chole 2018, CVA 2018, anxiety, afib, BPH, pacemaker, CHF, CKD, frontal lobe CVA with memory deficit, HTN, HLD, OSA, PVD, DM with peripheral neuropathy, R toe amputation, L ankle ORIF, TKR.  Clinical Impression   Pt presents with AMS, LE weakness L>R due to chronic CVA 2018, difficulty performing mobility tasks, decreased safety awareness, difficulty following commands, L knee pain, and decreased activity tolerance due to L knee pain and fatigue. Pt to benefit from acute PT to address deficits. Pt ambulated short hallway distance with RW, requiring up to min assist for steadying. PT recommending pt continue with HHPT to address mobility deficits. PT to progress mobility as tolerated, and will continue to follow acutely.      Follow Up Recommendations Home health PT    Equipment Recommendations  None recommended by PT    Recommendations for Other Services       Precautions / Restrictions Precautions Precautions: Fall;ICD/Pacemaker Restrictions Weight Bearing Restrictions: No      Mobility  Bed Mobility Overal bed mobility: Needs Assistance Bed Mobility: Supine to Sit     Supine to sit: Min guard;HOB elevated     General bed mobility comments: Min guard for safety, increased time to rise with increased effort. Pt with use of bedrails to come to sitting.  Transfers Overall transfer level: Needs assistance Equipment used: Rolling walker (2 wheeled) Transfers: Sit to/from Stand Sit to Stand: Min assist;From elevated surface         General transfer comment: Min assist for power up, steadying. Sit to stand x2, both times from bed. Verbal cuing for hand  placement.  Ambulation/Gait Ambulation/Gait assistance: Min guard;Min assist Gait Distance (Feet): 30 Feet Assistive device: Rolling walker (2 wheeled) Gait Pattern/deviations: Step-through pattern;Decreased stride length;Wide base of support;Drifts right/left;Trunk flexed Gait velocity: decr   General Gait Details: Min guard for safety, occasional min assist for steadying. Verbal cuing for placement in RW, upright posture. HR WFL. Distance limited by L knee pain and pt fatigue.  Stairs            Wheelchair Mobility    Modified Rankin (Stroke Patients Only)       Balance Overall balance assessment: Needs assistance Sitting-balance support: Feet supported Sitting balance-Leahy Scale: Fair     Standing balance support: During functional activity;Bilateral upper extremity supported Standing balance-Leahy Scale: Poor Standing balance comment: reliant on UE support in standing, leaning heavily on RW.                             Pertinent Vitals/Pain Pain Assessment: 0-10 Pain Score: 3  Pain Location: L knee Pain Descriptors / Indicators: Sore;Discomfort Pain Intervention(s): Limited activity within patient's tolerance;Monitored during session;Repositioned    Home Living Family/patient expects to be discharged to:: Private residence Living Arrangements: Spouse/significant other Available Help at Discharge: Family Type of Home: House Home Access: Stairs to enter Entrance Stairs-Rails: Right Entrance Stairs-Number of Steps: 1 Home Layout: One level Home Equipment: Clinical cytogeneticist - 2 wheels;Cane - single point;Grab bars - toilet;Grab bars - tub/shower;Walker - 4 wheels;Toilet riser      Prior Function Level of Independence: Independent with assistive device(s)  Comments: pt reports using rollator for ambulation PTA, but reports independence with ADLs     Hand Dominance   Dominant Hand: Right    Extremity/Trunk Assessment   Upper  Extremity Assessment Upper Extremity Assessment: Defer to OT evaluation    Lower Extremity Assessment Lower Extremity Assessment: LLE deficits/detail;Generalized weakness LLE Deficits / Details: 2+/5 knee extension, 3/5 hip flexion, 4/5 hip abduction/adduction LLE Sensation: history of peripheral neuropathy    Cervical / Trunk Assessment Cervical / Trunk Assessment: Normal  Communication   Communication: HOH  Cognition Arousal/Alertness: Awake/alert Behavior During Therapy: WFL for tasks assessed/performed Overall Cognitive Status: Impaired/Different from baseline Area of Impairment: Problem solving;Safety/judgement;Memory;Attention;Following commands                   Current Attention Level: Selective Memory: Decreased short-term memory Following Commands: Follows one step commands with increased time Safety/Judgement: Decreased awareness of safety;Decreased awareness of deficits   Problem Solving: Slow processing;Decreased initiation;Difficulty sequencing;Requires verbal cues General Comments: Pt requires multiple safety cues, repeatedly for same thing. Pt often asks PT to repeat questions, HOH vs processing difficulty.      General Comments      Exercises     Assessment/Plan    PT Assessment Patient needs continued PT services  PT Problem List Decreased strength;Decreased mobility;Decreased safety awareness;Decreased activity tolerance;Decreased balance;Decreased knowledge of use of DME;Pain       PT Treatment Interventions DME instruction;Gait training;Stair training;Functional mobility training;Therapeutic activities;Therapeutic exercise;Balance training;Neuromuscular re-education;Patient/family education    PT Goals (Current goals can be found in the Care Plan section)  Acute Rehab PT Goals Patient Stated Goal: get stronger, start with HHPT PT Goal Formulation: With patient Time For Goal Achievement: 01/24/19 Potential to Achieve Goals: Good     Frequency Min 3X/week   Barriers to discharge        Co-evaluation               AM-PAC PT "6 Clicks" Mobility  Outcome Measure Help needed turning from your back to your side while in a flat bed without using bedrails?: A Lot Help needed moving from lying on your back to sitting on the side of a flat bed without using bedrails?: A Little Help needed moving to and from a bed to a chair (including a wheelchair)?: A Little Help needed standing up from a chair using your arms (e.g., wheelchair or bedside chair)?: A Little Help needed to walk in hospital room?: A Little Help needed climbing 3-5 steps with a railing? : A Lot 6 Click Score: 16    End of Session Equipment Utilized During Treatment: Gait belt Activity Tolerance: Patient limited by fatigue;Patient limited by pain Patient left: in chair;with call bell/phone within reach;with chair alarm set;with family/visitor present Nurse Communication: Mobility status PT Visit Diagnosis: Unsteadiness on feet (R26.81);Difficulty in walking, not elsewhere classified (R26.2)    Time: 9417-4081 PT Time Calculation (min) (ACUTE ONLY): 22 min   Charges:   PT Evaluation $PT Eval Low Complexity: 1 Low         Soul Hackman Conception Chancy, PT Acute Rehabilitation Services Pager [phone]  Office [phone]   Lysette Lindenbaum D Cacey Willow 01/10/2019, 1:50 PM

## 2019-01-10 NOTE — TOC Initial Note (Signed)
Transition of Care [facility]) - Initial/Assessment Note    Patient Details  Name: David [name] MRN: [id number] Date of Birth: 18-Dec-1940  Transition of Care [facility]) CM/SW Contact:    Lia Hopping, [place] Phone Number: 01/10/2019, 2:28 PM  Clinical Narrative:                 Patient recently seen a [facility] for Stroke work up. Patient admitted for Hypoglycemia and AMS. Patient discharge with [facility] services (OT/PT/ RN,) through [place]. Patient and spouse agreeable to resume services at discharge.  CSW notified Rep Georgina Snell.    Expected Discharge Plan: [place] Barriers to Discharge: No Barriers Identified   Patient Goals and CMS Choice Patient states their goals for this hospitalization and ongoing recovery are:: to return home CMS Medicare.gov Compare Post Acute Care list provided to:: Patient Represenative (must comment) Choice offered to / list presented to : Spouse  Expected Discharge Plan and Services Expected Discharge Plan: [place] In-house Referral: Clinical Social Work Discharge Planning Services: CM Consult Post Acute Care Choice: [place] arrangements for the past 2 months: [place] Expected Discharge Date: 01/07/19                         HH Arranged: PT, OT HH Agency: [place] [place] Date [facility] Agency Contacted: 01/10/19 Time HH Agency Contacted: 68 Representative spoke with at [place]: Georgina Snell  Prior Living Arrangements/Services Living arrangements for the past 2 months: [place] Lives with:: Spouse Patient language and need for interpreter reviewed:: No Do you feel safe going back to the place where you live?: Yes      Need for Family Participation in Patient Care: Yes (Comment) Care giver support system in place?: Yes (comment) Current home services: Home OT, Home PT, Home RN Criminal Activity/Legal Involvement Pertinent to Current Situation/Hospitalization: No - Comment as  needed  Activities of Daily Living Home Assistive Devices/Equipment: CPAP ADL Screening (condition at time of admission) Patient's cognitive ability adequate to safely complete daily activities?: Yes Is the patient deaf or have difficulty hearing?: No Does the patient have difficulty seeing, even when wearing glasses/contacts?: No Does the patient have difficulty concentrating, remembering, or making decisions?: No Patient able to express need for assistance with ADLs?: Yes Does the patient have difficulty dressing or bathing?: No Independently performs ADLs?: Yes (appropriate for developmental age) Does the patient have difficulty walking or climbing stairs?: Yes Weakness of Legs: Both Weakness of Arms/Hands: Both  Permission Sought/Granted   Permission granted to share information with : Yes, Release of Information Signed              Emotional Assessment Appearance:: Appears stated age   Affect (typically observed): Accepting Orientation: : Oriented to Self, Oriented to Place Alcohol / Substance Use: Not Applicable Psych Involvement: No (comment)  Admission diagnosis:  Metabolic encephalopathy [H88.50] Dehydration [E86.0] Hypoglycemia [E16.2] AKI (acute kidney injury) ([place]) [N17.9] Patient Active Problem List   Diagnosis Date Noted  . Hypoglycemia 01/10/2019  . Acute metabolic encephalopathy 27/74/1287  . AKI (acute kidney injury) ([place]) 01/10/2019  . Acute encephalopathy 01/07/2019  . Hypertensive encephalopathy 01/07/2019  . Encephalopathy acute 01/05/2019  . Memory change 11/19/2018  . Senile purpura ([place]) 11/27/2017  . CKD (chronic kidney disease) stage 4, GFR 15-29 ml/min (HCC) 09/26/2017  . 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 Nonobstructive CAD s/p  PCI 2012   . Cardiac pacemaker in situ   . PVD (peripheral vascular disease) ([place])   . Anxiety 03/22/2017  . Acute cholecystitis s/p lap cholecystectomy 03/05/2017  03/04/2017  . 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[place]) 03/04/2017  . 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 Fatigue 01/27/2017  . Low vitamin B12 level 01/27/2017  . OSA on CPAP 10/26/2014  . Seasonal and perennial allergic rhinitis 10/25/2013  . HTN (hypertension) 10/25/2013  . Hyperlipidemia associated with type 2 diabetes mellitus ([place]) 10/25/2013   PCP:  [name], MD Pharmacy:   [place], [name] - [address] [name] [address] Phone: [phone] Fax: [phone]     Social Determinants of Health (SDOH) Interventions    Readmission Risk Interventions No flowsheet data found.

## 2019-01-10 NOTE — Telephone Encounter (Signed)
Left VM with verbal orders.

## 2019-01-11 LAB — GLUCOSE, CAPILLARY
Glucose-Capillary: 143 mg/dL — ABNORMAL HIGH (ref 70–99)
Glucose-Capillary: 158 mg/dL — ABNORMAL HIGH (ref 70–99)
Glucose-Capillary: 169 mg/dL — ABNORMAL HIGH (ref 70–99)
Glucose-Capillary: 185 mg/dL — ABNORMAL HIGH (ref 70–99)
Glucose-Capillary: 201 mg/dL — ABNORMAL HIGH (ref 70–99)
Glucose-Capillary: 201 mg/dL — ABNORMAL HIGH (ref 70–99)
Glucose-Capillary: 211 mg/dL — ABNORMAL HIGH (ref 70–99)
Glucose-Capillary: 242 mg/dL — ABNORMAL HIGH (ref 70–99)

## 2019-01-11 LAB — BLOOD GAS, VENOUS
Acid-base deficit: 1.2 mmol/L (ref 0.0–2.0)
Bicarbonate: 22.4 mmol/L (ref 20.0–28.0)
O2 Saturation: 79.1 %
Patient temperature: 37
pCO2, Ven: 35.2 mmHg — ABNORMAL LOW (ref 44.0–60.0)
pH, Ven: 7.42 (ref 7.250–7.430)
pO2, Ven: 44.5 mmHg (ref 32.0–45.0)

## 2019-01-11 LAB — CBC
HCT: 36 % — ABNORMAL LOW (ref 39.0–52.0)
Hemoglobin: 11.2 g/dL — ABNORMAL LOW (ref 13.0–17.0)
MCH: 29.8 pg (ref 26.0–34.0)
MCHC: 31.1 g/dL (ref 30.0–36.0)
MCV: 95.7 fL (ref 80.0–100.0)
Platelets: 216 10*3/uL (ref 150–400)
RBC: 3.76 MIL/uL — ABNORMAL LOW (ref 4.22–5.81)
RDW: 15.7 % — ABNORMAL HIGH (ref 11.5–15.5)
WBC: 10.2 10*3/uL (ref 4.0–10.5)
nRBC: 0 % (ref 0.0–0.2)

## 2019-01-11 LAB — COMPREHENSIVE METABOLIC PANEL
ALT: 23 U/L (ref 0–44)
AST: 17 U/L (ref 15–41)
Albumin: 3.8 g/dL (ref 3.5–5.0)
Alkaline Phosphatase: 84 U/L (ref 38–126)
Anion gap: 9 (ref 5–15)
BUN: 62 mg/dL — ABNORMAL HIGH (ref 8–23)
CO2: 22 mmol/L (ref 22–32)
Calcium: 9.3 mg/dL (ref 8.9–10.3)
Chloride: 109 mmol/L (ref 98–111)
Creatinine, Ser: 2.25 mg/dL — ABNORMAL HIGH (ref 0.61–1.24)
GFR calc Af Amer: 31 mL/min — ABNORMAL LOW (ref >60)
GFR calc non Af Amer: 27 mL/min — ABNORMAL LOW (ref >60)
Glucose, Bld: 203 mg/dL — ABNORMAL HIGH (ref 70–99)
Potassium: 4.6 mmol/L (ref 3.5–5.1)
Sodium: 140 mmol/L (ref 135–145)
Total Bilirubin: 0.4 mg/dL (ref 0.3–1.2)
Total Protein: 7 g/dL (ref 6.5–8.1)

## 2019-01-11 LAB — TSH: TSH: 1.165 u[IU]/mL (ref 0.350–4.500)

## 2019-01-11 LAB — VITAMIN B12: Vitamin B-12: 433 pg/mL (ref 180–914)

## 2019-01-11 LAB — FOLATE: Folate: 12.3 ng/mL (ref >5.9)

## 2019-01-11 LAB — MAGNESIUM: Magnesium: 2.3 mg/dL (ref 1.7–2.4)

## 2019-01-11 MED ORDER — INSULIN ASPART 100 UNIT/ML ~~LOC~~ SOLN
0.0000 [IU] | Freq: Three times a day (TID) | SUBCUTANEOUS | Status: DC
  Administered 2019-01-11: 17:00:00 5 [IU] via SUBCUTANEOUS
  Administered 2019-01-12 (×2): 2 [IU] via SUBCUTANEOUS

## 2019-01-11 MED ORDER — INSULIN ASPART 100 UNIT/ML ~~LOC~~ SOLN: 0.0000 [IU] | Freq: Every day | SUBCUTANEOUS | Status: DC

## 2019-01-11 NOTE — Progress Notes (Signed)
Entered room to place patient on CPAP for the evening.  Patient states that he does not want to wear CPAP at this time.

## 2019-01-11 NOTE — Progress Notes (Signed)
PROGRESS NOTE    David [name]  IOE:[id number] DOB: 05-25-1940 DOA: 01/09/2019 PCP: [name], MD   Brief Narrative:  David [name] is David [name] 78 y.o. male with medical history significant of SA node dysfunction status post PPM, previous CVA, BPH, CAD, CHF, CKD stage III-IV, HOCM, hypertension, hyperlipidemia, insulin-dependent diabetes mellitus with insulin pump, OSA on CPAP, peripheral vascular disease being brought to the hospital by EMS for evaluation of hyperglycemia and altered mental status. Per EMS report, family stated that the patient has been altered since Saturday.  She was seen at [facility] for Bettina Warn stroke.  Her CBG was 141 at lunch time, patient took insulin and CBG dropped to 35.  Patient became unconscious.  CBG 35 on EMS arrival.  EMS gave 1 amp of D10.  CBG 69 in route and EMS gave another 1 amp D10.  Blood pressure 218/70.  Patient was recently admitted 8/22-8/24 for acute neurologic changes.  Neurology was consulted and stroke was ruled out after doing work-up including head CT, brain MRI, and MRA head and neck.  His presentation was thought to be secondary to hypertensive encephalopathy.  EEG was negative for seizures.  Patient is oriented to person and place.  He knows it is August 2020 but does not know the exact date.  He is not sure why he is at the hospital.  Thinks maybe his blood sugar was too high.  History provided by daughter at bedside who states patient was recently admitted to the hospital as they thought he was having David [name] stroke but after work-up it was determined that he was not having David Heiberger stroke.  States since his hospital discharge he has not gotten any better.  He has been very weak, not eating much, and not walking.  His speech has been garbled.  His blood pressure has been high at home despite taking his medications.  His blood glucose has been high for the past few days but today it was low despite patient eating Emaya Preston full lunch which was rich in carbohydrates.  States he  home regimen includes 12 units of insulin in the morning with breakfast, 12 units with lunch, and 10 units with dinner.  Daughter does not know what dose of insulin patient took with lunch today.  States his insulin pump was turned off during his recent hospitalization and he has not been using it since then.  States he has been coughing and having shortness of breath recently.  ED Course: Blood pressure 161/52 on arrival, remainder of vitals stable.  CBG 103 on arrival, subsequently 68.  White count 11.8 and hemoglobin 11.2, no significant change since labs done 4 days ago.  BUN 69, creatinine 2.2 (recent baseline 1.7-1.8).  UA not suggestive of infection.  ABG with pH 7.38, PCO2 38, and PO2 87.  Chest x-ray showing new left lower lobe atelectasis versus infiltrate.  CT head negative for acute intracranial abnormality.    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  Start SSI. Stop D10. He'd been d/c'd off his pump last visit due to concern he'd be able to manage it and is now only using bolus insulin. Sounds like he was using u500 insulin at meals.  This is likely  the reason for his hypoglycemia. Appreciate diabetic coordinator assistance. Continue lantus 30 units BID, 5 units aspart with meals.  Continue to monitor and adjust as needed.  Acute metabolic encephalopathy Currently awake and alert x1 (some confusion around month and couldn't tell me which hospital today).  He feels better overall.  Discussed with his wife, suspect this is some delirium related to hospital stay.  Suspect this was all related to hypoglycemia above given his improvement now.  CT without acute abnormality (stable parenchymal volume loss and  chronic microvascular ischemic changes.  Stable prominence of retrocerebellar CSF space). Continue to monitor.  Follow VBG, TSH, b12, folate.  Sounds like gradual decline recently.  Plan for neurology referral at discharge. Possibly also hypertensive encephalopathy (this was presumed at last hospitalization), continue to follow BP closely With improvement will hold off on repeat imaging at this time.  Consider further w/u if he doesn't continue to improve.  ?CAP Repeat CXR this AM with mild L lung base hypoventilation.  Procalcitonin negative.  Discontinue abx.  Continue to monitor.   Hypertension Continue home BP meds Follow closely  AKI on CKD stage III-IV Creatinine 2.25 today (recent baseline 1.7-1.8, but has ranged from 1.9-2.4 over the past year or so).  - Creatinine relatively stable today, continue IVF -Continue to monitor renal function -Monitor urine output -Avoid nephrotoxic agents -Continue home calcitriol  OSA -CPAP at night  Hyperlipidemia -Continue Lipitor  Previous CVA -Continue home Plavix and lipitor  Repeat covid screening test - negative  DVT prophylaxis: heparin Code Status: full  Family Communication: wife at bedside Disposition Plan: hopefully d/c tomorrow with continued improvement   Consultants:   none  Procedures:   none  Antimicrobials:  Anti-infectives (From admission, onward)   Start     Dose/Rate Route Frequency Ordered Stop   01/09/19 2345  cefTRIAXone (ROCEPHIN) 1 g in sodium chloride 0.9 % 100 mL IVPB  Status:  Discontinued     1 g 200 mL/hr over 30 Minutes Intravenous Daily at bedtime 01/09/19 2339 01/10/19 1446   01/09/19 2345  azithromycin (ZITHROMAX) 500 mg in sodium chloride 0.9 % 250 mL IVPB  Status:  Discontinued     500 mg 250 mL/hr over 60 Minutes Intravenous Daily at bedtime 01/09/19 2339 01/10/19 1446      Subjective: David [name]&Ox1 today.  Feels well overall.  Wife notes some confusion and not quite back to baseline.   Discussed suspect delirium.  At this point, don't think MRI necessary and she agrees.    Objective: Vitals:   01/10/19 2039 01/11/19 0513 01/11/19 0519 01/11/19 1349  BP: (!) 135/43 (!) 190/54 (!) 187/55 (!) 151/57  Pulse: 67 60 60 63  Resp: (!) 21 (!) 23  20  Temp: 98.2 F (36.8 C) 97.7 F (36.5 C)  98.2 F (36.8 C)  TempSrc:  Oral  Oral  SpO2: 97% 99% 95% 95%    Intake/Output Summary (Last 24 hours) at 01/11/2019 1830 Last data filed at 01/11/2019 1230 Gross per 24 hour  Intake 240 ml  Output 1450 ml  Net -1210 ml   There were no vitals filed for this visit.  Examination:  General: No acute distress. Cardiovascular: Heart sounds show David [name] regular rate, and rhythm.  Lungs: Clear to auscultation bilaterally . Abdomen: Soft, nontender, nondistended  Neurological: Alert and oriented 3. Moves all extremities 4. Chronic L facial droop. Skin: Warm and dry. No rashes or lesions. Extremities: No clubbing or cyanosis. No edema.  Psychiatric: Mood and affect are  normal. Insight and judgment are appropriate.     Data Reviewed: I have personally reviewed following labs and imaging studies  CBC: Recent Labs  Lab 01/05/19 1434 01/09/19 1855 01/10/19 0015 01/11/19 0538  WBC 11.2* 11.8* 10.7* 10.2  NEUTROABS 8.3*  --   --   --   HGB 11.6* 11.2* 9.9* 11.2*  HCT 37.0* 34.9* 31.1* 36.0*  MCV 94.4 93.1 93.7 95.7  PLT 224 221 207 314   Basic Metabolic Panel: Recent Labs  Lab 01/05/19 1434 01/09/19 1855 01/10/19 0015 01/11/19 0538  NA 139 139 137 140  K 4.4 3.8 4.3 4.6  CL 107 105 106 109  CO2 22 23 21* 22  GLUCOSE 189* 79 132* 203*  BUN 43* 69* 68* 62*  CREATININE 1.85* 2.22* 2.21* 2.25*  CALCIUM 9.3 9.4 8.7* 9.3  MG  --   --   --  2.3   GFR: Estimated Creatinine Clearance: 40.1 mL/min (David [name]) (by C-G formula based on SCr of 2.25 mg/dL (H)). Liver Function Tests: Recent Labs  Lab 01/05/19 1434 01/09/19 1855 01/11/19 0538  AST 27 27 17   ALT 29 25 23   ALKPHOS 85  90 84  BILITOT 0.7 0.4 0.4  PROT 7.0 7.0 7.0  ALBUMIN 3.7 3.8 3.8   No results for input(s): LIPASE, AMYLASE in the last 168 hours. No results for input(s): AMMONIA in the last 168 hours. Coagulation Profile: Recent Labs  Lab 01/05/19 1434  INR 1.0   Cardiac Enzymes: No results for input(s): CKTOTAL, CKMB, CKMBINDEX, TROPONINI in the last 168 hours. BNP (last 3 results) No results for input(s): PROBNP in the last 8760 hours. HbA1C: No results for input(s): HGBA1C in the last 72 hours. CBG: Recent Labs  Lab 01/11/19 0313 01/11/19 0738 01/11/19 1145 01/11/19 1451 01/11/19 1608  GLUCAP 158* 201* 211* 242* 201*   Lipid Profile: No results for input(s): CHOL, HDL, LDLCALC, TRIG, CHOLHDL, LDLDIRECT in the last 72 hours. Thyroid Function Tests: Recent Labs    01/11/19 1620  TSH 1.165   Anemia Panel: Recent Labs    01/11/19 1620  VITAMINB12 433  FOLATE 12.3   Sepsis Labs: Recent Labs  Lab 01/10/19 0015  PROCALCITON <0.10    Recent Results (from the past 240 hour(s))  Novel Coronavirus, NAA (hospital order; send-out to ref lab)     Status: Abnormal   Collection Time: 01/05/19  5:24 PM   Specimen: Nasopharyngeal Swab; Respiratory  Result Value Ref Range Status   SARS-CoV-2, NAA QUANTITY NOT SUFFICIENT, UNABLE TO PERFORM TEST (David [name]) NOT DETECTED Final    Comment: (NOTE) Quantity was not sufficient for analysis.      David [name] notified 01/08/2019 Performed At: [facility] [address] [place], [name] [id number] Rush Farmer MD YI:[id number]    Coronavirus Source NASOPHARYNGEAL  Final    Comment: Performed at [facility], [place] [address], [place], [name] [address]  SARS CORONAVIRUS 2 (TAT 6-12 HRS) Nasal Swab Aptima Multi Swab     Status: None   Collection Time: 01/10/19  2:05 PM   Specimen: Aptima Multi Swab; Nasal Swab  Result Value Ref Range Status   SARS Coronavirus 2 NEGATIVE NEGATIVE Final    Comment: (NOTE) SARS-CoV-2 target nucleic  acids are NOT DETECTED. The SARS-CoV-2 RNA is generally detectable in upper and lower respiratory specimens during the acute phase of infection. Negative results do not preclude SARS-CoV-2 infection, do not rule out co-infections with other pathogens, and should not be used as the sole  basis for treatment or other patient management decisions. Negative results must be combined with clinical observations, patient history, and epidemiological information. The expected result is Negative. Fact Sheet for Patients: SugarRoll.be Fact Sheet for Healthcare Providers: https://www.woods-mathews.com/ This test is not yet approved or cleared by the [place] FDA and  has been authorized for detection and/or diagnosis of SARS-CoV-2 by FDA under an Emergency Use Authorization (EUA). This EUA will remain  in effect (meaning this test can be used) for the duration of the COVID-19 declaration under Section 56 4(b)(1) of the Act, 21 U.S.C. section 360bbb-3(b)(1), unless the authorization is terminated or revoked sooner. Performed at [facility], [place] [address], [place], [place] [address]          Radiology Studies: Dg Chest 2 View  Result Date: 01/10/2019 CLINICAL DATA:  78 year old male with weakness and shortness of breath. Altered mental status. EXAM: CHEST - 2 VIEW COMPARISON:  01/09/2019 and earlier. FINDINGS: Semi upright AP and lateral views of the chest. Stable left chest cardiac pacemaker. Stable cardiomegaly with evidence of right atrial enlargement. Mediastinal contours are stable since 2016. Mild hypo ventilation at the left lung base also appears stable since 2016 with no pneumothorax, pulmonary edema, pleural effusion or confluent opacity. Flowing endplate osteophytes in the spine. No acute osseous abnormality identified. Paucity of bowel gas in the upper abdomen. IMPRESSION: Chronic cardiomegaly with mild left lung base hypo  ventilation. No acute cardiopulmonary abnormality. Electronically Signed   By: Genevie Ann [name]   On: 01/10/2019 12:44   Ct Head Wo Contrast  Result Date: 01/09/2019 CLINICAL DATA:  Altered level of consciousness, altered since Saturday EXAM: CT HEAD WITHOUT CONTRAST TECHNIQUE: Contiguous axial images were obtained from the base of the skull through the vertex without intravenous contrast. COMPARISON:  Imaging January 05, 2019 FINDINGS: Brain: No evidence of acute infarction, hemorrhage, hydrocephalus, extra-axial collection or mass lesion/mass effect. Symmetric prominence of the ventricles, cisterns and sulci compatible with parenchymal volume loss. Patchy areas of white matter hypoattenuation are most compatible with chronic microvascular angiopathy. Stable calcifications along the right cerebral convexity. Prominent retrocerebellar CSF space, similar to priors. Vascular: Intracranial atherosclerotic calcifications. No unexpected hyperdense vessel Skull: No calvarial fracture or suspicious osseous lesion. No scalp swelling or hematoma. Sinuses/Orbits: Orbital structures are unremarkable aside from prior lens extractions. Paranasal sinuses and mastoid air cells are predominantly clear. Other: Edentulous with mandibular prognathism IMPRESSION: No acute intracranial abnormality. Stable parenchymal volume loss and chronic microvascular ischemic changes. Stable prominence of the retrocerebellar CSF space could reflect more focal cerebellar volume loss versus mega cisterna magna or arachnoid cyst. Electronically Signed   By: Lovena Le [name]   On: 01/09/2019 21:31   Dg Chest Port 1 View  Result Date: 01/09/2019 CLINICAL DATA:  Altered mental status. EXAM: PORTABLE CHEST 1 VIEW COMPARISON:  Chest x-ray dated January 05, 2019. FINDINGS: Unchanged left chest wall pacemaker. Stable cardiomegaly. Normal pulmonary vascularity. New opacity in the left lower lobe silhouetting the left hemidiaphragm. No pleural effusion or  pneumothorax. No acute osseous abnormality. IMPRESSION: 1. New left lower lobe atelectasis versus infiltrate. Electronically Signed   By: Titus Dubin [name]   On: 01/09/2019 19:39        Scheduled Meds:  atorvastatin  10 mg Oral q1800   calcitRIOL  0.25 mcg Oral Daily   carvedilol  25 mg Oral BID WC   cholecalciferol  1,000 Units Oral Daily   clopidogrel  75 mg Oral Daily   dextrose  1 ampule Intravenous Once   heparin  5,000 Units Subcutaneous Q8H   hydrALAZINE  100 mg Oral TID   insulin aspart  0-15 Units Subcutaneous TID WC   insulin aspart  0-5 Units Subcutaneous QHS   insulin aspart  5 Units Subcutaneous TID WC   insulin glargine  30 Units Subcutaneous BID   isosorbide mononitrate  60 mg Oral Daily   pantoprazole  40 mg Oral Daily   sertraline  100 mg Oral Daily   sodium chloride flush  3 mL Intravenous Once   sodium chloride flush  3 mL Intravenous Q12H   vitamin B-12  100 mcg Oral Daily   Continuous Infusions:  sodium chloride       LOS: 1 day    Time spent: over 30 min    Fayrene Helper, MD [facility] Pager AMION  If 7PM-7AM, please contact night-coverage [email or web address] Password [email or web address] 01/11/2019, 6:30 PM

## 2019-01-11 NOTE — Consult Note (Signed)
   [facility] CM Inpatient Consult   01/11/2019  David [name] Dec 09, 1940 [id number]   Patient chart has been reviewed for readmissions less than 30 days and for high risk score, 23%, for unplanned readmissions.  Patient assessed for community [place] [facility] follow up needs.  Received consent to speak with patient's wife, David [name], and HIPAA verified. Explained [facility] CM services as program working to assist patients in chronic disease management and assistance with community resources. Verbal consent received for [facility] CM follow up when patient returns home.   Explained that [place] Management services does not replace or interfere with any services that are arranged by inpatient case management or social work. Referral placed for community RN follow up.  Netta Cedars, MSN, [place] [facility] Liaison Nurse Mobile Phone [phone]  Toll free office [phone]

## 2019-01-12 LAB — COMPREHENSIVE METABOLIC PANEL
ALT: 19 U/L (ref 0–44)
AST: 16 U/L (ref 15–41)
Albumin: 3.3 g/dL — ABNORMAL LOW (ref 3.5–5.0)
Alkaline Phosphatase: 70 U/L (ref 38–126)
Anion gap: 7 (ref 5–15)
BUN: 54 mg/dL — ABNORMAL HIGH (ref 8–23)
CO2: 22 mmol/L (ref 22–32)
Calcium: 9.3 mg/dL (ref 8.9–10.3)
Chloride: 111 mmol/L (ref 98–111)
Creatinine, Ser: 1.89 mg/dL — ABNORMAL HIGH (ref 0.61–1.24)
GFR calc Af Amer: 39 mL/min — ABNORMAL LOW (ref >60)
GFR calc non Af Amer: 33 mL/min — ABNORMAL LOW (ref >60)
Glucose, Bld: 125 mg/dL — ABNORMAL HIGH (ref 70–99)
Potassium: 4.4 mmol/L (ref 3.5–5.1)
Sodium: 140 mmol/L (ref 135–145)
Total Bilirubin: 0.4 mg/dL (ref 0.3–1.2)
Total Protein: 6.2 g/dL — ABNORMAL LOW (ref 6.5–8.1)

## 2019-01-12 LAB — CBC
HCT: 31.2 % — ABNORMAL LOW (ref 39.0–52.0)
Hemoglobin: 9.9 g/dL — ABNORMAL LOW (ref 13.0–17.0)
MCH: 30.3 pg (ref 26.0–34.0)
MCHC: 31.7 g/dL (ref 30.0–36.0)
MCV: 95.4 fL (ref 80.0–100.0)
Platelets: 179 10*3/uL (ref 150–400)
RBC: 3.27 MIL/uL — ABNORMAL LOW (ref 4.22–5.81)
RDW: 15.6 % — ABNORMAL HIGH (ref 11.5–15.5)
WBC: 9.1 10*3/uL (ref 4.0–10.5)
nRBC: 0 % (ref 0.0–0.2)

## 2019-01-12 LAB — GLUCOSE, CAPILLARY
Glucose-Capillary: 145 mg/dL — ABNORMAL HIGH (ref 70–99)
Glucose-Capillary: 142 mg/dL — ABNORMAL HIGH (ref 70–99)

## 2019-01-12 LAB — MAGNESIUM: Magnesium: 2.2 mg/dL (ref 1.7–2.4)

## 2019-01-12 MED ORDER — BLOOD GLUCOSE MONITOR KIT: PACK | 0 refills | Status: AC

## 2019-01-12 MED ORDER — NOVOLOG FLEXPEN 100 UNIT/ML ~~LOC~~ SOPN: 5.0000 [IU] | PEN_INJECTOR | Freq: Three times a day (TID) | SUBCUTANEOUS | 0 refills | Status: DC

## 2019-01-12 MED ORDER — NOVOLIN 70/30 FLEXPEN RELION (70-30) 100 UNIT/ML ~~LOC~~ SUPN: 30.0000 [IU] | PEN_INJECTOR | Freq: Two times a day (BID) | SUBCUTANEOUS | 0 refills | Status: DC

## 2019-01-12 MED ORDER — INSULIN GLARGINE 100 UNIT/ML SOLOSTAR PEN: 30.0000 [IU] | PEN_INJECTOR | Freq: Two times a day (BID) | SUBCUTANEOUS | 0 refills | Status: DC

## 2019-01-12 MED ORDER — AMLODIPINE BESYLATE 5 MG PO TABS
5.0000 mg | ORAL_TABLET | Freq: Every day | ORAL | Status: DC
  Administered 2019-01-12: 10:00:00 5 mg via ORAL
  Filled 2019-01-12: qty 1

## 2019-01-12 MED ORDER — AMLODIPINE BESYLATE 5 MG PO TABS: 5.0000 mg | ORAL_TABLET | Freq: Every day | ORAL | 0 refills | Status: DC

## 2019-01-12 NOTE — Discharge Summary (Signed)
**Note De-Identified vi Obfusction** Physicin Dischrge [facility]  David [name] IDP:[id number] DOB: Sep 06, 1940 DOA: 01/09/2019  PCP: Vivi Brrck, MD  Admit dte: 01/09/2019 Dischrge dte: 01/12/2019  Time spent: 40 minutes  Recommendtions for Outptient Follow-up:  1. Follow outptient CBC/CMP 2. Follow blood sugr outptient.  Presented with hypoglycemi (he ws using u500 for meltime insulin fter recent dischrge which led to this).  BG's resonble on 30 units lntus BID with 5 units TID with mels of sprt, but this ws prohibitively expensive.  Dischrged with 30 units 70/30, will need close follow up of blood sugrs nd djustment of regimen. 3. Pt seems to be declining cognitively per wife, concern for dementi? Vsculr? Neurology referrl plced.  Pt hd some delirium here which ws improving, but still present on d/c.   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 Per EMS report, fmily stted tht the ptient hs been ltered since [place]. She ws seen t [facility] for  stroke. Her CBG ws 141 t lunch time, ptient took insulin nd CBG dropped to 35. Ptient becme unconscious. CBG 35 on EMS rrivl. EMS gve 1 mp of D10. CBG 69 in route nd EMS gve nother 1 mp D10. Blood pressure  218/70.  Ptient ws recently dmitted 8/22-8/24 for cute neurologic chnges. Neurology ws consulted nd stroke ws ruled out fter doing work-up including hed CT, brin MRI, nd MRA hed nd neck. His presenttion ws thought to be secondry to hypertensive encephlopthy. EEG ws negtive for seizures.  Ptient is oriented to person nd plce. He knows it is August 2020 but does not know the exct dte. He is not sure why he is t the hospitl. Sherron Ales his blood sugr ws too high. History provided by dughter t bedside who sttes ptient ws recently dmitted to the hospitl s they thought he ws hving  stroke but fter work-up it ws determined tht he ws not hving  stroke. Sttes since his hospitl dischrge he hs not gotten ny better. He hs been very wek, not eting much, nd not wlking. His speech hs been grbled. His blood pressure hs been high t home despite tking his medictions. His blood glucose hs been high for the pst few dys but tody it ws low despite ptient eting  full lunch which ws rich in crbohydrtes. Sttes he home regimen includes 12 units of insulin in the morning with brekfst, 12 units with lunch, nd 10 units with dinner. Dughter does not know wht dose of insulin ptient took with lunch tody. Sttes his insulin pump ws turned off during his recent hospitliztion nd he hs not been using it since then. Sttes he hs been coughing nd hving shortness of breth recently.  ED Course:Blood pressure 161/52 on rrivl, reminderofvitlsstble. CBG 103 on rrivl,subsequently 68. White count 11.8 nd hemoglobin 11.2,no significnt chnge since lbs done 4 dys go. BUN 69, **Note De-Identified vi Obfusction** cretinine 2.2 (recent bseline 1.7-1.8). U not suggestive of infection. BG with pH 7.38, PCO2 38, nd PO2 87. Chest x-ry showing new left lower lobe telectsis versus infiltrte. CT hed negtive for cute intrcrnil bnormlity.  He ws  dmitted for ltered mentl sttus nd hypoglycemi.  His insulin pump hd recently been d/c'd t the lst hospitliztion nd pprently he ws using U500 insulin for his meltime insulin t home which is thought to be the reson for his hypoglycemi nd MS.  His BG improved nd he ws strted on lntus 30 units BID with 5 units sprt with mels.  Unfortuntely, this ws cost prohibitive nd he ws dischrged with 70/30 relion.  His mentl sttus improved throughout the hospitliztion.  He hd some residul delirium on the dy of dischrge, but it ws improving.  Bsed on discussion with wife, sounds like cognitive decline recently.  Dischrged with neuro follow up.  See below for further detils  Hospitl Course:  Hypoglycemi in the setting of insulin-dependent dibetes mellitus  T2DM BG now elevted.  Strt SSI. Stop D10. He'd been d/c'd off his pump lst visit due to concern he'd be ble to mnge it nd is now only using bolus insulin. Sounds like he ws using u500 insulin t mels.  This is likely the reson for his hypoglycemi. pprecite dibetic coordintor ssistnce. Continue lntus 30 units BID, 5 units sprt with mels.  Cost prohibitive t dischrge.  Dischrged on relion insulin 70/30 30 units BID.  Needs close follow up for titrtion.  cute metbolic encephlopthy Currently wke nd lert x1 (some confusion round month nd couldn't tell me which hospitl tody).  He feels better overll.  Discussed with his wife, suspect this is some delirium relted to hospitl sty.  Suspect this ws ll relted to hypoglycemi bove given his improvement now.  CT without cute bnormlity (stble prenchyml volume loss nd chronic microvsculr ischemic chnges.  Stble prominence of retrocerebellr CSF spce). Continue to monitor.  Follow VBG (wnl), TSH (wnl), b12 (wnl), folte (wnl).  Sounds like grdul decline recently per discussion with wife.  Pln for neurology referrl t  dischrge. Possiblylsohypertensive encephlopthy (this ws presumed t lst hospitliztion), continue to follow BP closely With improvementwill hold off on repet imging t this time.  Consider further w/u if he doesn't continue to improve.  ?CP Repet CXR this M with mild L lung bse hypoventiltion.  Proclcitonin negtive.  Discontinue bx.  Continue to monitor.   Hypertension mlodipine dded to home meds.  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 - Cretinine reltively stble tody, continue IVF -Continue to monitor renl function -Monitor urine output -void nephrotoxic gents -Continue home clcitriol  OS -CPP t night  Hyperlipidemi -Continue Lipitor  Previous CV -Continue home Plvix nd lipitor  Procedures:  none  Consulttions:  none  Dischrge Exm: Vitls:   01/12/19 0507 01/12/19 1000  BP: (!) 181/52 (!) 160/53  Pulse: 61 (!) 59  Resp: (!) 21   Temp: 97.9 F (36.6 C) 98 F (36.7 C)  SpO2: 95% 96%   &Ox2.  Feels well no complints. Wife t bedside, feels things re grdully improving, but cknowledges some confusion.  She thinks he's  bit confused with night/dy.  grees tht being in the home environment will likely improve things.  Notes cognitive decline recently.  Generl: No cute distress. Crdiovsculr: Hert sounds show  regulr rte,  and rhythm Lungs: Clear to auscultation bilaterally Abdomen: Soft, nontender, nondistended  Neurological: Alert and oriented 3. Moves all extremities 4. Cranial nerves II through XII grossly intact. Skin: Warm and dry. No rashes or lesions. Extremities: No clubbing or cyanosis. No edema.   Discharge Instructions   Discharge Instructions    AMB Referral to [place] [facility]   Complete by: As directed    Please refer to community RN for complex case  management services. States that best number to call is wife, [name], on cell phone, [phone].  Netta Cedars, MSN, RN [facility] Liaison Nurse Mobile Phone [phone]  Toll free office [phone]   Reason for consult: Post hospital follow up   Diagnoses of: Diabetes   Expected date of contact: 1-3 days (reserved for hospital discharges)   Ambulatory referral to Neurology   Complete by: As directed    An appointment is requested in approximately: 2 weeks   Call MD for:  difficulty breathing, headache or visual disturbances   Complete by: As directed    Call MD for:  extreme fatigue   Complete by: As directed    Call MD for:  persistant dizziness or light-headedness   Complete by: As directed    Call MD for:  persistant nausea and vomiting   Complete by: As directed    Call MD for:  redness, tenderness, or signs of infection (pain, swelling, redness, odor or green/yellow discharge around incision site)   Complete by: As directed    Call MD for:  severe uncontrolled pain   Complete by: As directed    Call MD for:  temperature >100.4   Complete by: As directed    Diet - low sodium heart healthy   Complete by: As directed    Discharge instructions   Complete by: As directed    You were seen for low blood sugars and confusion.   We think this was related to the use of U500 insulin.  We have stopped your U500 insulin and are discharging you with lantus 30 units twice a day as well as novolog 5 units with meals.  Continue to follow your blood sugars and bring the results to your PCP so they can further adjust your regimen.  You had some confusion that has improved during your hospital stay, but is still present.  I think some of this is related to hospital acquired delirium and hopefully will continue to improve as you get back home.  On discussion with your wife, it sounds like there has been a gradual cognitive decline recently.  I will send you to neurology for  further work up of this (a referral has been placed on discharge).  Continue your blood pressure medications.  I've started you on a new medication called amlodipine as well.  Continue to follow your blood pressures outpatient.  Please follow up with your PCP within the next few days.  Return for new, recurrent, or worsening symptoms.  Please ask your PCP to request records from this hospitalization so they know what was done and what the next steps will be.   Increase activity slowly   Complete by: As directed      Allergies as of 01/12/2019      Reactions   Ativan [lorazepam] Anxiety   Pt gets more agitated   Adhesive [tape] Other (See Comments)   blisters      Medication List    STOP taking these medications   insulin regular 100 units/mL injection  Commonly known as: NOVOLIN R   insulin regular human CONCENTRATED 500 UNIT/ML injection Commonly known as: HUMULIN R     TAKE these medications   acetaminophen 325 MG tablet Commonly known as: TYLENOL Take 1-2 tablets (325-650 mg total) by mouth every 4 (four) hours as needed for mild pain.   amLODipine 5 MG tablet Commonly known as: NORVASC Take 1 tablet (5 mg total) by mouth daily. Start taking on: January 13, 2019   atorvastatin 10 MG tablet Commonly known as: LIPITOR TAKE 1 TABLET BY MOUTH ONCE DAILY AT  6PM What changed: See the new instructions.   blood glucose meter kit and supplies Kit Dispense based on patient and insurance preference. Use up to four times daily as directed. (FOR ICD-9 250.00, 250.01).   calcitRIOL 0.25 MCG capsule Commonly known as: ROCALTROL Take 1 capsule by mouth once daily   carvedilol 25 MG tablet Commonly known as: COREG TAKE 1 TABLET BY MOUTH TWICE DAILY WITH A MEAL What changed: See the new instructions.   cholecalciferol 1000 units tablet Commonly known as: VITAMIN D Take 1 tablet (1,000 Units total) by mouth daily.   clopidogrel 75 MG tablet Commonly known as: PLAVIX Take  1 tablet by mouth once daily   diclofenac sodium 1 % Gel Commonly known as: VOLTAREN Apply 2 g topically 4 (four) times daily. What changed: when to take this   glucose blood test strip Use Contour test strips as instructed to check blood sugar four times daily.   hydrALAZINE 100 MG tablet Commonly known as: APRESOLINE Take 100 mg by mouth 3 (three) times daily. Take 1 tablet by mouth three times daily.   INSULIN SYRINGE 1CC/31GX5/16" 31G X 5/16" 1 ML Misc 1 each by Does not apply route 3 (three) times daily. Use insulin syringe to inject insulin three times daily.   isosorbide mononitrate 30 MG 24 hr tablet Commonly known as: IMDUR TAKE TWO TABLETS BY MOUTH ONCE DAILY   NovoLIN 70/30 FlexPen Relion (70-30) 100 UNIT/ML PEN Generic drug: Insulin Isophane & Regular Human Inject 30 Units into the skin 2 (two) times daily before a meal.   pantoprazole 40 MG tablet Commonly known as: PROTONIX TAKE ONE TABLET BY MOUTH ONCE DAILY   sertraline 100 MG tablet Commonly known as: ZOLOFT Take 100 mg by mouth daily.   torsemide 20 MG tablet Commonly known as: DEMADEX Take 2 tablets (30m) by mouth every OTHER day. What changed:   how much to take  how to take this  when to take this  additional instructions   Victoza 18 MG/3ML Sopn Generic drug: liraglutide Inject 0.3 mLs (1.8 mg total) into the skin daily before supper. What changed: how much to take   vitamin B-12 100 MCG tablet Commonly known as: CYANOCOBALAMIN Take 100 mcg by mouth daily.      Allergies  Allergen Reactions  . Ativan [Lorazepam] Anxiety    Pt gets more agitated  . Adhesive [Tape] Other (See Comments)    blisters      The results of significant diagnostics from this hospitalization (including imaging, microbiology, ancillary and laboratory) are listed below for reference.    Significant Diagnostic Studies: Dg Chest 2 View  Result Date: 01/10/2019 CLINICAL DATA:  78year old male with  weakness and shortness of breath. Altered mental status. EXAM: CHEST - 2 VIEW COMPARISON:  01/09/2019 and earlier. FINDINGS: Semi upright AP and lateral views of the chest. Stable left chest cardiac pacemaker. Stable cardiomegaly with evidence of right atrial enlargement.  Mediastinal contours are stable since 2016. Mild hypo ventilation at the left lung base also appears stable since 2016 with no pneumothorax, pulmonary edema, pleural effusion or confluent opacity. Flowing endplate osteophytes in the spine. No acute osseous abnormality identified. Paucity of bowel gas in the upper abdomen. IMPRESSION: Chronic cardiomegaly with mild left lung base hypo ventilation. No acute cardiopulmonary abnormality. Electronically Signed   By: Genevie Ann [name]   On: 01/10/2019 12:44   Ct Head Wo Contrast  Result Date: 01/09/2019 CLINICAL DATA:  Altered level of consciousness, altered since Saturday EXAM: CT HEAD WITHOUT CONTRAST TECHNIQUE: Contiguous axial images were obtained from the base of the skull through the vertex without intravenous contrast. COMPARISON:  Imaging January 05, 2019 FINDINGS: Brain: No evidence of acute infarction, hemorrhage, hydrocephalus, extra-axial collection or mass lesion/mass effect. Symmetric prominence of the ventricles, cisterns and sulci compatible with parenchymal volume loss. Patchy areas of white matter hypoattenuation are most compatible with chronic microvascular angiopathy. Stable calcifications along the right cerebral convexity. Prominent retrocerebellar CSF space, similar to priors. Vascular: Intracranial atherosclerotic calcifications. No unexpected hyperdense vessel Skull: No calvarial fracture or suspicious osseous lesion. No scalp swelling or hematoma. Sinuses/Orbits: Orbital structures are unremarkable aside from prior lens extractions. Paranasal sinuses and mastoid air cells are predominantly clear. Other: Edentulous with mandibular prognathism IMPRESSION: No acute intracranial  abnormality. Stable parenchymal volume loss and chronic microvascular ischemic changes. Stable prominence of the retrocerebellar CSF space could reflect more focal cerebellar volume loss versus mega cisterna magna or arachnoid cyst. Electronically Signed   By: Lovena Le [name]   On: 01/09/2019 21:31   Mr Angio Head Wo Contrast  Result Date: 01/07/2019 CLINICAL DATA:  TIA.  Confusion, speech disturbance, and weakness. EXAM: MRI HEAD WITHOUT CONTRAST MRA HEAD WITHOUT CONTRAST TECHNIQUE: Multiplanar, multiecho pulse sequences of the brain and surrounding structures were obtained without intravenous contrast. Angiographic images of the head were obtained using MRA technique without contrast. COMPARISON:  Head CT 01/05/2019 and MRI 08/31/2017 FINDINGS: MRI HEAD FINDINGS The study is mildly motion degraded despite the use of more motion resistant imaging protocols. Brain: There is no evidence of acute infarct, intracranial hemorrhage, mass, midline shift, or extra-axial fluid collection. There is mild-to-moderate cerebral atrophy. Bilateral cerebral white matter T2 hyperintensities are nonspecific but compatible with mild chronic small vessel ischemic disease, similar to the prior incomplete MRI. Vascular: Major intracranial vascular flow voids are preserved. Skull and upper cervical spine: Unremarkable bone marrow signal. Sinuses/Orbits: Bilateral cataract extraction. Trace right mastoid fluid. Clear paranasal sinuses. Other: None. MRA HEAD FINDINGS The visualized distal vertebral arteries are widely patent to the basilar and codominant. Patent left PICA, bilateral AICA, and bilateral SCA origins are identified with the right AICA being duplicated. The basilar artery is widely patent. Posterior communicating arteries are not identified and may be small or absent. Both PCAs are patent without evidence of significant proximal stenosis. The internal carotid arteries are patent from skull base to carotid termini with  mild paraclinoid stenosis on the left. ACAs and MCAs are patent without evidence of proximal branch occlusion or significant proximal stenosis. No aneurysm is identified. IMPRESSION: 1. No acute intracranial abnormality. 2. Mild chronic small vessel ischemic disease. 3. Mild left ICA stenosis.  No large vessel occlusion. Electronically Signed   By: Logan Bores [name]   On: 01/07/2019 14:33   Mr Brain Wo Contrast  Result Date: 01/07/2019 CLINICAL DATA:  TIA.  Confusion, speech disturbance, and weakness. EXAM: MRI HEAD WITHOUT CONTRAST MRA HEAD WITHOUT  CONTRAST TECHNIQUE: Multiplanar, multiecho pulse sequences of the brain and surrounding structures were obtained without intravenous contrast. Angiographic images of the head were obtained using MRA technique without contrast. COMPARISON:  Head CT 01/05/2019 and MRI 08/31/2017 FINDINGS: MRI HEAD FINDINGS The study is mildly motion degraded despite the use of more motion resistant imaging protocols. Brain: There is no evidence of acute infarct, intracranial hemorrhage, mass, midline shift, or extra-axial fluid collection. There is mild-to-moderate cerebral atrophy. Bilateral cerebral white matter T2 hyperintensities are nonspecific but compatible with mild chronic small vessel ischemic disease, similar to the prior incomplete MRI. Vascular: Major intracranial vascular flow voids are preserved. Skull and upper cervical spine: Unremarkable bone marrow signal. Sinuses/Orbits: Bilateral cataract extraction. Trace right mastoid fluid. Clear paranasal sinuses. Other: None. MRA HEAD FINDINGS The visualized distal vertebral arteries are widely patent to the basilar and codominant. Patent left PICA, bilateral AICA, and bilateral SCA origins are identified with the right AICA being duplicated. The basilar artery is widely patent. Posterior communicating arteries are not identified and may be small or absent. Both PCAs are patent without evidence of significant proximal  stenosis. The internal carotid arteries are patent from skull base to carotid termini with mild paraclinoid stenosis on the left. ACAs and MCAs are patent without evidence of proximal branch occlusion or significant proximal stenosis. No aneurysm is identified. IMPRESSION: 1. No acute intracranial abnormality. 2. Mild chronic small vessel ischemic disease. 3. Mild left ICA stenosis.  No large vessel occlusion. Electronically Signed   By: Logan Bores [name]   On: 01/07/2019 14:33   Dg Chest Port 1 View  Result Date: 01/09/2019 CLINICAL DATA:  Altered mental status. EXAM: PORTABLE CHEST 1 VIEW COMPARISON:  Chest x-ray dated January 05, 2019. FINDINGS: Unchanged left chest wall pacemaker. Stable cardiomegaly. Normal pulmonary vascularity. New opacity in the left lower lobe silhouetting the left hemidiaphragm. No pleural effusion or pneumothorax. No acute osseous abnormality. IMPRESSION: 1. New left lower lobe atelectasis versus infiltrate. Electronically Signed   By: Titus Dubin [name]   On: 01/09/2019 19:39   Dg Chest Portable 1 View  Result Date: 01/05/2019 CLINICAL DATA:  Confusion.  Disorientation. EXAM: PORTABLE CHEST 1 VIEW COMPARISON:  09/02/2017 FINDINGS: 1647 hours. The lungs are clear without focal pneumonia, edema, pneumothorax or pleural effusion. The cardio pericardial silhouette is enlarged. Left permanent pacemaker again noted. The visualized bony structures of the thorax are intact. Telemetry leads overlie the chest. IMPRESSION: Cardiomegaly without acute cardiopulmonary findings. Electronically Signed   By: Misty Stanley [name]   On: 01/05/2019 17:04   Dg Foot Complete Left  Result Date: 01/01/2019 Please see detailed radiograph report in office note.  Ct Head Code Stroke Wo Contrast  Result Date: 01/05/2019 CLINICAL DATA:  Code stroke.  Expressive aphasia EXAM: CT HEAD WITHOUT CONTRAST TECHNIQUE: Contiguous axial images were obtained from the base of the skull through the vertex without  intravenous contrast. COMPARISON:  08/30/2017 FINDINGS: Brain: No evidence of acute infarction, hemorrhage, hydrocephalus, extra-axial collection or mass lesion/mass effect. Small vessel ischemic gliosis in the deep cerebral white matter. Mild for age volume loss. Small calcifications along the right cerebral convexity, unchanged. These could be vascular or subarachnoid/inflammatory. Vascular: Atherosclerotic calcification.  No hyperdense vessel. Skull: No acute or aggressive finding Sinuses/Orbits: Bilateral cataract resection Other: These results were communicated to Xu at 3:45 pmon 8/22/2020by text page via the [facility] messaging system. ASPECTS [facility] Stroke Program Early CT Score) - Ganglionic level infarction (caudate, lentiform nuclei, internal capsule, insula, M1-M3 cortex): 7 -  Supraganglionic infarction (M4-M6 cortex): 3 Total score (0-10 with 10 being normal): 10 IMPRESSION: 1. No acute finding.  ASPECTS is 10. 2. Atrophy and chronic small vessel ischemia. Electronically Signed   By: Monte Fantasia [name]   On: 01/05/2019 15:46   Vas US Carotid (at [place] Only)  Result Date: 01/07/2019 Carotid Arterial Duplex Study Indications:       CVA, confusion. Risk Factors:      Hypertension, hyperlipidemia, Diabetes, coronary artery                    disease, prior CVA. Other Factors:     Atrial fibrillation, pacemaker, CHF. Limitations        Today's exam was limited due to the body habitus of the                    patient and confusion. Comparison Study:  Prior study from 08/31/17 is available Performing Technologist: Sharion Dove RVS  Examination Guidelines: A complete evaluation includes B-mode imaging, spectral Doppler, color Doppler, and power Doppler as needed of all accessible portions of each vessel. Bilateral testing is considered an integral part of a complete examination. Limited examinations for reoccurring indications may be performed as noted.  Right Carotid Findings:  +----------+--------+--------+--------+------------------+------------------+           PSV cm/sEDV cm/sStenosisPlaque DescriptionComments           +----------+--------+--------+--------+------------------+------------------+ CCA Prox  147     23                                intimal thickening +----------+--------+--------+--------+------------------+------------------+ CCA Distal51      9                                 intimal thickening +----------+--------+--------+--------+------------------+------------------+ ICA Distal105     42                                                   +----------+--------+--------+--------+------------------+------------------+ ECA       112     6                                                    +----------+--------+--------+--------+------------------+------------------+ +----------+--------+-------+--------+-------------------+           PSV cm/sEDV cmsDescribeArm Pressure (mmHG) +----------+--------+-------+--------+-------------------+ [id number]                                         +----------+--------+-------+--------+-------------------+ +---------+--------+--+--------+-+ VertebralPSV cm/s63EDV cm/s4 +---------+--------+--+--------+-+   Left Carotid Findings: +----------+--------+--------+--------+------------------+--------+           PSV cm/sEDV cm/sStenosisPlaque DescriptionComments +----------+--------+--------+--------+------------------+--------+ CCA Prox  112     18                                         +----------+--------+--------+--------+------------------+--------+ CCA Distal58      22                                         +----------+--------+--------+--------+------------------+--------+  ICA Prox  114     23                                         +----------+--------+--------+--------+------------------+--------+ ICA Distal92      27                                          +----------+--------+--------+--------+------------------+--------+ ECA       120     18                                         +----------+--------+--------+--------+------------------+--------+ +----------+--------+--------+------------+-------------------+ SubclavianPSV cm/sEDV cm/sDescribe    Arm Pressure (mmHG) +----------+--------+--------+------------+-------------------+                           Not assessed                    +----------+--------+--------+------------+-------------------+ +---------+--------+---+--------+--+ VertebralPSV cm/s130EDV cm/s21 +---------+--------+---+--------+--+   Summary: Right Carotid: The extracranial vessels were near-normal with only minimal wall                thickening or plaque. No significant change from prior study done                08/31/17. Left Carotid: The extracranial vessels were near-normal with only minimal wall               thickening or plaque. No significant change from prior study done               08/31/17. Vertebrals:  Bilateral vertebral arteries demonstrate antegrade flow. 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 Normal flow hemodynamics              were seen in the right subclavian artery. *See table(s) above for measurements and observations.  Electronically signed by Antony Contras MD on 01/07/2019 at 8:18:17 AM.    Final     Microbiology: Recent Results (from the past 240 hour(s))  Novel Coronavirus, NAA (hospital order; send-out to ref lab)     Status: Abnormal   Collection Time: 01/05/19  5:24 PM   Specimen: Nasopharyngeal Swab; Respiratory  Result Value Ref Range Status   SARS-CoV-2, NAA QUANTITY NOT SUFFICIENT, UNABLE TO PERFORM TEST (A) NOT DETECTED Final    Comment: (NOTE) Quantity was not sufficient for analysis.      Shyrl Numbers notified 01/08/2019 Performed At: [facility] [address] [place], [name] [id number] Rush Farmer MD VH:[id number]    Coronavirus  Source NASOPHARYNGEAL  Final    Comment: Performed at [facility], [place] [address], [place], [name] [address]  SARS CORONAVIRUS 2 (TAT 6-12 HRS) Nasal Swab Aptima Multi Swab     Status: None   Collection Time: 01/10/19  2:05 PM   Specimen: Aptima Multi Swab; Nasal Swab  Result Value Ref Range Status   SARS Coronavirus 2 NEGATIVE NEGATIVE Final    Comment: (NOTE) SARS-CoV-2 target nucleic acids are NOT DETECTED. The SARS-CoV-2 RNA is generally detectable in upper and lower respiratory specimens during the acute phase of infection. Negative results do not preclude SARS-CoV-2 infection, do not rule out co-infections with other  pathogens, and should not be used as the sole basis for treatment or other patient management decisions. Negative results must be combined with clinical observations, patient history, and epidemiological information. The expected result is Negative. Fact Sheet for Patients: SugarRoll.be Fact Sheet for Healthcare Providers: https://www.woods-mathews.com/ This test is not yet approved or cleared by the [place] FDA and  has been authorized for detection and/or diagnosis of SARS-CoV-2 by FDA under an Emergency Use Authorization (EUA). This EUA will remain  in effect (meaning this test can be used) for the duration of the COVID-19 declaration under Section 56 4(b)(1) of the Act, 21 U.S.C. section 360bbb-3(b)(1), unless the authorization is terminated or revoked sooner. Performed at [facility], [place] [address], [place], [place] [address]      Labs: Basic Metabolic Panel: Recent Labs  Lab 01/09/19 1855 01/10/19 0015 01/11/19 0538 01/12/19 0534  NA 139 137 140 140  K 3.8 4.3 4.6 4.4  CL 105 106 109 111  CO2 23 21* 22 22  GLUCOSE 79 132* 203* 125*  BUN 69* 68* 62* 54*  CREATININE 2.22* 2.21* 2.25* 1.89*  CALCIUM 9.4 8.7* 9.3 9.3  MG  --   --  2.3 2.2   Liver Function Tests: Recent Labs  Lab  01/09/19 1855 01/11/19 0538 01/12/19 0534  AST _0 ALT _1 ALKPHOS 90 84 70  BILITOT 0.4 0.4 0.4  PROT 7.0 7.0 6.2*  ALBUMIN 3.8 3.8 3.3*   No results for input(s): LIPASE, AMYLASE in the last 168 hours. No results for input(s): AMMONIA in the last 168 hours. CBC: Recent Labs  Lab 01/09/19 1855 01/10/19 0015 01/11/19 0538 01/12/19 0534  WBC 11.8* 10.7* 10.2 9.1  HGB 11.2* 9.9* 11.2* 9.9*  HCT 34.9* 31.1* 36.0* 31.2*  MCV 93.1 93.7 95.7 95.4  PLT 221 207 216 179   Cardiac Enzymes: No results for input(s): CKTOTAL, CKMB, CKMBINDEX, TROPONINI in the last 168 hours. BNP: BNP (last 3 results) Recent Labs    01/05/19 1434  BNP 429.9*    ProBNP (last 3 results) No results for input(s): PROBNP in the last 8760 hours.  CBG: Recent Labs  Lab 01/11/19 1608 01/11/19 1955 01/11/19 2143 01/12/19 0722 01/12/19 1159  GLUCAP 201* 185* 143* 145* 142*       Signed:  Fayrene Helper MD.  [facility] 01/12/2019, 7:29 PM

## 2019-01-12 NOTE — Progress Notes (Signed)
Reviewed discharge paperwork with patient and wife. Went over discharge instructions, follow up appointments, & medication regimen. Patient escorted by NT to vehicle by wheelchair.

## 2019-01-14 ENCOUNTER — Encounter: Payer: Self-pay | Admitting: *Deleted

## 2019-01-14 ENCOUNTER — Telehealth: Payer: Self-pay

## 2019-01-14 ENCOUNTER — Other Ambulatory Visit: Payer: Self-pay | Admitting: *Deleted

## 2019-01-14 NOTE — Telephone Encounter (Signed)
  PER HOSPITAL VISIT ___________________________  Admit date: 01/09/2019 Discharge date: 01/12/2019  Time spent: 40 minutes  Recommendations for Outpatient Follow-up:  1. Follow outpatient CBC/CMP 2. Follow blood sugar outpatient.  Presented with hypoglycemia (he was using u500 for mealtime insulin after recent discharge which led to this).  BG's reasonable on 30 units lantus BID with 5 units TID with meals of aspart, but this was prohibitively expensive.  Discharged with 30 units 70/30, will need close follow up of blood sugars and adjustment of regimen. 3. Pt seems to be declining cognitively per wife, concern for dementia? Vascular? Neurology referral placed.  Pt had some delirium here which was improving, but still present on d/c.   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 01/12/19   How have you been since you were released from the hospital? Patient is resting well,he has been having some knee pain.   Do you understand why you were in the hospital? yes   Do you understand the discharge instructions? yes   Where were you discharged to? Home   Items Reviewed:  Medications reviewed: yes  Allergies reviewed: yes  Dietary changes reviewed: yes  Referrals reviewed: yes   Functional Questionnaire:   Activities of Daily Living (ADLs):   He states they are independent in the following: N/A States they require assistance with the following: ambulation, bathing and hygiene, feeding, continence, grooming, toileting and dressing   Any transportation issues/concerns?: no   Any  patient concerns? Yes, knee issues.   Confirmed importance and date/time of follow-up visits scheduled yes  Provider Appointment booked with Dr. [name] 01/24/19 @ 1:00 PM  Confirmed with patient if condition begins to worsen call PCP or go to the ER.  Patient was given the office number and encouraged to call back with question or concerns.  : yes

## 2019-01-14 NOTE — Patient Outreach (Signed)
[facility]) [place] Telephone Outreach PCP office completes Transition of Care outreach post-hospital discharge Post-hospital discharge day # 2  01/14/2019  David [name] [birth year] [id number]  Successful telephone outreach to David [name], wife/ caregiver, on [place] for David [name], 78 y/o male referred to [place] by [facility] Liaison RN CM after 2 recent hospitalizations August 22-24, 2020 for AMS/ neurological changes suspected hypertensive encephalopathy and then again on August 26-29, 2020 for AMS/ confusion and hypoglycemia.  Patient was discharged home to self-care with home health services for RN, PT/OT/ speech pathology; CSW through [place] agency.  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  HIPAA/ identity verified with patient's caregiver today and [place] services were discussed with her; caregiver provides verbal consent for [facility] CM involvement in patient's care post- recent hospital discharges.  Today, patient's caregiver reports that "things are going better than" she "thought they would," now that patient is at home after his 2 recent hospitalizations.  Reports patent's memory has been poor since his stroke last year and that his short term memory has progressively worsened.  States patient "seems a little more clear" since being discharged from hospital, and that patient's "spirits seem much better."  Caregiver reports that patient has chronic (L) knee pain from arthritis at "6/10," "which nothing helps."  Reports that patient has not had falls since his recent hospital discharges, and that he continues to use a cane/ walker for ambulation.  Caregiver denies that patient is in distress today.  Pleasant call.  Caregiver further  reports:  Medications: -- Has all medicationsand takes as prescribed;denies questions about current medications.  -- Verbalizes good general understanding of the purpose, dosing, and scheduling of medications.   -- caregiver manages medications and provides to patient, including administering his insulin  -- denies issues with swallowing medications -- patient was recently discharged from the hospital and all medications were thoroughly reviewed with caregiver today- caregiver verbalizes accurate understanding of all medication changes made at time of most recent hospital discharge; today, she denies concerns and questions around patient's medications -- to note, the insulin instructions on patient's discharge instructions were changed, due to cost of insulin originally prescribed post- recent hospital discharge; caregiver is able to accurately verbalize current insulin type and dosing and confirms that she administers insulin with each scheduled dose-- currently taking 70/30-- 30 Units BID- this is patient's only insulin per caregiver report  Home health [facility]) services: -- [place] services for PT, OT, RN, CSW in place through [place] home health agency -- confirms that [facility] services initially begun after his first hospital discharge; waiting to hear about resuming services after most recent discharge -- confirms that she has the telephone number for home health agency; offered to assist in following up on status of resuming services, however caregiver declines, stating she will call to follow up- made caregiver aware that I can assist at any time as indicated   Provider appointments: -- All upcoming provider appointments were reviewed with caregiver today; caregiver verbalizes accurate understanding of scheduled appointments; reports family members will assist with transportting patient to scheduled appointments -- noted through review of EMR that patient does not appear to have scheduled PCP office  visit post-hospital discharge; caregiver reports she will call to schedule  Safety/  Mobility/ Falls: -- denies new/ recent falls; however, reports several falls over the last year -- assistive devices: uses cane and/or walker for all ambulation -- general fall risks/ prevention education discussed with caregiver today  [place] needs: -- currently denies community resource needs, stating supportive family members that assist with care needs as indicated- caregiver reports she and patient have several local adult children that actively assist in patient's care needs as indicated.  Reports adult children visit she and patient regularly and are very involved in patient's care -- family provides transportation for patient to all provider appointments, errands, etc -- SDOH completed for: Financial strain; food insecurity, and transportation: no concerns/ gaps identified  Scientist, physiological (AD) Planning:   --reports does currently have exisisting Advanced Directives in place for HCPOA and Living Will; reports these documents were recently created at time of hospital visit; declines desire to make changes.  Basics of Advanced directive planning were reviewed with caregiver, who verbalizes a good general understanding of same.  Self-health management of chronic disease state of DM: -- confirms that patient used to be insulin pump and no longer is due to his recent episodes of hypoglycemia -- spouse checks patient's blood sugar 4 times per day- reports patient can no longer participate in his management of DM due to memory issues -- reports blood sugars "more stable" since recent hospital discharge- reports fasting blood sugars between 100-130 and post-prandial blood sugars between 140-200, consistently; reports patient's blood sugar "goal" as " all numbers no higher than 200" -- discussed signs/ symptoms hypoglycemia with caregiver along with corresponding action plan for  same.  Caregiver denies further issues, concerns, or problems today.  I provided/ confirmed that she and patient has my direct phone number, the main [facility] CM office phone number, and the [facility] CM 24-hour nurse advice phone number should issues arise prior to next scheduled [place] outreach which we scheduled today around caregiver preference.  Encouraged caregiver to contact me directly if needs, questions, issues, or concerns arise prior to next scheduled outreach; patient agreed to do so.  Plan:  Patient will take medications as prescribed and will attend all scheduled provider appointments  Patient/ caregiver will promptly schedule post-hospital discharge office visit with PCP  Patient/ caregiver will promptly notify care providers for any new concerns/ issues/ problems that arise  Patient will actively participate in home health services as ordered post-hospital discharge  Patient/ caregiver will continue monitoring/ recording daily blood sugars   I will make patient's PCP aware of [place] RN CM involvement in patient's care-- will send barriers letter  I will place printed educational material in mail to patient/ caregiver around fall prevention and low blood sugar  [facility] CM outreach to continue with scheduled phone call to caregiver next week   [facility] CM Care Plan Problem One     Most Recent Value  Care Plan Problem One  High risk for hospital readmission related to/ as evidenced by two recent hospitalizations for AMs/ hypoglycemia  Role Documenting the Problem One  Care Management Coordinator  Care Plan for Problem One  Active  [facility] Long Term Goal   Over the next 31 days, patient will not experience hospiatl readmission, as evidenced by caregiver reporting and review of EMR during [facility] RN CM outreach  [facility] Long Term Goal Start Date  01/14/19  Interventions for Problem One Long Term Goal  Discussed patient's current clinical conditon with caregiver/ spouse and  confirmed that she has no  current clinical concerns,  reviewed post-hospital discharge instructions with caregiver and completed medication review,  confirmed that caregiver has and is administering patient's medications as instructed,  [facility] CM program initiated  [facility] CM Short Term Goal #1   Over the next 30 days, patient will actively participate in home health services as ordered post-hospital discharge, as evidenced by caregiver reporting and colaboration with home health team as indicated during [facility] RN CM outreach  [facility] CM Short Term Goal #1 Start Date  01/14/19  Interventions for Short Term Goal #1  Discussed home health services in place for patient post-hospital discharge and confirmed that caregiver has contact information for home health team,  discussed difference between home health and [facility] CM services,  encouraged patient's active participation in all home health disciplines  [facility] CM Short Term Goal #2   Over the next 30 days, patient will attend all scheduled provider appointments, as evidenced by caregiver reporting and collaboration as indicated with care providers during [facility] RN CM outreach  [facility] CM Short Term Goal #2 Start Date  01/14/19  Interventions for Short Term Goal #2  Confirmed that patient has reliable transportation through family members and that caregiver plans to attend all scheduled provider appointments,  confirmed that caregiver has contact information for care providers and encouraged caregiver to promptly schedule post-hospital discharge PCP and neurology appointments     I appreciate the opportunity to participate in David [name]'s care,  Oneta Rack, RN, BSN, [employer] Coordinator [facility] Care Management  [phone]

## 2019-01-14 NOTE — Progress Notes (Signed)
Virtual Visit via TelephoneNote   This visit type was conducted due to national recommendations for restrictions regarding the COVID-19 Pandemic (e.g. social distancing) in an effort to limit this patient's exposure and mitigate transmission in our community.  Due to his co-morbid illnesses, this patient is at least at moderate risk for complications without adequate follow up.  This format is felt to be most appropriate for this patient at this time.  All issues noted in this document were discussed and addressed.  A limited physical exam was performed with this format.  Please refer to the patient's chart for his consent to telehealth for [facility].   Evaluation Performed:  Follow-up visit  This visit type was conducted due to national recommendations for restrictions regarding the COVID-19 Pandemic (e.g. social distancing).  This format is felt to be most appropriate for this patient at this time.  All issues noted in this document were discussed and addressed.  No physical exam was performed (except for noted visual exam findings with Video Visits).  Please refer to the patient's chart (MyChart message for video visits and phone note for telephone visits) for the patient's consent to telehealth for [facility].  Date:  01/15/2019   ID:  David [name], DOB Nov 21, 1940, MRN [id number]  Patient Location:  Home  Provider location:   [place]  PCP:  David Barrack, MD  Sleep medicine:  David Him, MD Electrophysiologist:  None   Chief Complaint:  OSA  History of Present Illness:    David [name] is a 78 y.o. male who presents via audio/video conferencing for a telehealth visit today.    This is a 78yo male with a hx of OSA and has been on CPAP but has not had a sleep MD.  He is referred by Dr. Caryl [name] for further evaluation and establish sleep medicine care.  He had a home sleep study in 2017 showing moderate OSA with an AHI of 25.3/hr and O2 desats of 84% and was started  on CPAP and initially followed by Dr. Annamaria [name] but has not seen [name] in a long time.  He is doing well with his CPAP device.  He tolerates the nasal pillow mask and feels the pressure is adequate but feels there is not enough pressure in the beginning.  He feels rested in the am but still naps during the day.  He denies any significant mouth or nasal dryness or nasal congestion.  He does not think that he snores.    The patient does not have symptoms concerning for COVID-19 infection (fever, chills, cough, or new shortness of breath).   Prior CV studies:   The following studies were reviewed today:  PAP compliance download  Past Medical History:  Diagnosis Date  . Acute cholecystitis s/p lap cholecystectomy 03/05/2017 03/04/2017  . Anemia, iron deficiency   . Anxiety   . Arthritis   . Atrial fibrillation with rapid ventricular response ([place])   . BPH (benign prostatic hypertrophy)   . CAD (coronary artery disease)    Nonobstructive CAD per cath  . Cardiac pacemaker in situ   . CHF (congestive heart failure) ([place])   . Chronic ulcer of right foot ([place])   . CKD (chronic kidney disease) stage 4, GFR 15-29 ml/min (HCC) 09/26/2017  . CKD (chronic kidney disease), stage III ([place]) secondary to DM and HTN   nephrologist-  David [name]  . Coronary artery disease involving native coronary artery of native heart without angina pectoris   . Diastolic  dysfunction   . Dyspnea   . 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 History of cellulitis    right great toe 10-25-2014  . History of skin cancer   . HOCM (hypertrophic obstructive cardiomyopathy) ([place])   . Hyperlipidemia associated with type 2 diabetes mellitus ([place]) 10/25/2013  . Hypertension   . Hypertension associated with diabetes ([place]) 10/25/2013  . 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 Insulin dependent type 2 diabetes mellitus ([place]) 1991   followd by dr Dwyane [name]--  has  insulin pump  . Insulin pump in place   . OSA on CPAP   . 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 Peripheral neuropathy    severe  . Peripheral vascular disease ([place])    bilateral lower extremities  . PVD (peripheral vascular disease) ([place])   . Rib fracture 07/24/2015  . Seasonal and perennial allergic rhinitis 10/25/2013  . Seborrheic keratosis 09/19/2016  . Secondary hyperparathyroidism of renal origin ([place])   . 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 Sinus node dysfunction (HCC)   . Sleep apnea   . Syncope 07/24/2015   Past Surgical History:  Procedure Laterality Date  . AMPUTATION OF REPLICATED TOES  Mar 2751   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)  . AMPUTATION TOE Right 03/12/2015   Procedure: RIGHT HALLUS AMPUTATION ;  Surgeon: [name], MD;  Location: [facility];  Service: Podiatry;  Laterality: Right;  . CARDIAC CATHETERIZATION  11-25-2010   [place], [place]   Nonobstructive CAD  . CARDIAC PACEMAKER PLACEMENT  Nov 2009   [employer]  . CHOLECYSTECTOMY N/A 03/05/2017   Procedure: LAPAROSCOPIC CHOLECYSTECTOMY WITH INTRAOPERATIVE CHOLANGIOGRAM;  Surgeon: [name], MD;  Location: [facility];  Service: General;  Laterality: N/A;  . EP IMPLANTABLE DEVICE N/A 06/03/2015   Procedure: PPM Generator Changeout;  Surgeon: [name], MD;  Location: [facility];  Service: Cardiovascular;  Laterality: N/A;  . EXCISION BONE CYST Right 03/06/2015   Procedure: BONE BIOPSIES OF RIGHT FOOT;  Surgeon: [name], MD;  Location: [facility];  Service: Podiatry;  Laterality: Right;  . ORIF ANKLE FRACTURE Left 11/06/2014   Procedure: OPEN REDUCTION INTERNAL FIXATION (ORIF) LEFT  ANKLE FRACTURE;  Surgeon: [name], MD;  Location: [facility];  Service: Orthopedics;  Laterality: Left;  . TEE WITHOUT CARDIOVERSION N/A 09/01/2017   Procedure: TRANSESOPHAGEAL ECHOCARDIOGRAM (TEE);  Surgeon: [name], MD;  Location: [facility];  Service: Cardiovascular;  Laterality:  N/A;  . TOTAL KNEE ARTHROPLASTY    . VEIN LIGATION AND STRIPPING       Current Meds  Medication Sig  . acetaminophen (TYLENOL) 325 MG tablet Take 1-2 tablets (325-650 mg total) by mouth every 4 (four) hours as needed for mild pain.  Marland Kitchen amLODipine (NORVASC) 5 MG tablet Take 1 tablet (5 mg total) by mouth daily.  Marland Kitchen atorvastatin (LIPITOR) 10 MG tablet TAKE 1 TABLET BY MOUTH ONCE DAILY AT  6PM  . blood glucose meter kit and supplies KIT Dispense based on patient and insurance preference. Use up to four times daily as directed. (FOR ICD-9 250.00, 250.01).  . calcitRIOL (ROCALTROL) 0.25 MCG capsule Take 1 capsule by mouth once daily  . carvedilol (COREG) 25 MG tablet TAKE 1 TABLET BY MOUTH TWICE DAILY WITH A MEAL  . cholecalciferol (VITAMIN D) 1000 units tablet Take 1 tablet (1,000 Units total) by mouth daily.  . clopidogrel (PLAVIX) 75 MG tablet Take 1 tablet  by mouth once daily  . diclofenac sodium (VOLTAREN) 1 % GEL Apply 2 g topically 4 (four) times daily.  Marland Kitchen glucose blood test strip Use Contour test strips as instructed to check blood sugar four times daily.  . hydrALAZINE (APRESOLINE) 100 MG tablet Take 100 mg by mouth 3 (three) times daily. Take 1 tablet by mouth three times daily.  . Insulin Isophane & Regular Human (NOVOLIN 70/30 FLEXPEN RELION) (70-30) 100 UNIT/ML PEN Inject 30 Units into the skin 2 (two) times daily before a meal.  . Insulin Syringe-Needle U-100 (INSULIN SYRINGE 1CC/31GX5/16") 31G X 5/16" 1 ML MISC 1 each by Does not apply route 3 (three) times daily. Use insulin syringe to inject insulin three times daily.  . isosorbide mononitrate (IMDUR) 30 MG 24 hr tablet TAKE TWO TABLETS BY MOUTH ONCE DAILY  . liraglutide (VICTOZA) 18 MG/3ML SOPN Inject 0.3 mLs (1.8 mg total) into the skin daily before supper.  . pantoprazole (PROTONIX) 40 MG tablet TAKE ONE TABLET BY MOUTH ONCE DAILY  . sertraline (ZOLOFT) 100 MG tablet Take 100 mg by mouth daily.  Marland Kitchen torsemide (DEMADEX) 20 MG tablet  Take 2 tablets (93m) by mouth every OTHER day.  . vitamin B-12 (CYANOCOBALAMIN) 100 MCG tablet Take 100 mcg by mouth daily.     Allergies:   Ativan [lorazepam] and Adhesive [tape]   Social History   Tobacco Use  . Smoking status: Former Smoker    Packs/day: 2.00    Years: 30.00    Pack years: 60.00    Quit date: 03/03/1984    Years since quitting: 34.8  . Smokeless tobacco: Never Used  Substance Use Topics  . Alcohol use: Yes    Alcohol/week: 1.0 standard drinks    Types: 1 Glasses of wine per week    Comment: social  . Drug use: No     Family Hx: The patient's family history includes Cancer in his mother; Heart attack in his father; Stroke in his father.  ROS:   Please see the history of present illness.     All other systems reviewed and are negative.   Labs/Other Tests and Data Reviewed:    Recent Labs: 01/05/2019: B Natriuretic Peptide 429.9 01/11/2019: TSH 1.165 01/12/2019: ALT 19; BUN 54; Creatinine, Ser 1.89; Hemoglobin 9.9; Magnesium 2.2; Platelets 179; Potassium 4.4; Sodium 140   Recent Lipid Panel Lab Results  Component Value Date/Time   CHOL 138 01/06/2019 05:58 AM   TRIG 210 (H) 01/06/2019 05:58 AM   HDL 23 (L) 01/06/2019 05:58 AM   CHOLHDL 6.0 01/06/2019 05:58 AM   LDLCALC 73 01/06/2019 05:58 AM   LDLDIRECT 50.0 12/27/2017 01:22 PM    Wt Readings from Last 3 Encounters:  01/15/19 290 lb (131.5 kg)  01/11/19 291 lb 0.1 oz (132 kg)  01/05/19 289 lb 14.5 oz (131.5 kg)     Objective:    Vital Signs:  BP (!) 176/67   Ht '6\' 4"'  (1.93 m)   Wt 290 lb (131.5 kg)   BMI 35.30 kg/m     ASSESSMENT & PLAN:    1.  OSA -  The patient is tolerating PAP therapy well without any problems. The PAP download was reviewed today and showed an AHI of 2.6/hr on auto PAP with 80% compliance in using more than 4 hours nightly.  The patient has been using and benefiting from PAP use and will continue to benefit from therapy. I will notify his DME to change is ramp so  that he  is getting more air when he turns on the device.  2.  HTN - BP is elevated this am but he just took his meds.  Normally it runs around 150/29mHg.  Continue Carvedilol 234mBID, amlodipine 32m86maily and Hydralazine 100m39mD.  3.  Morbid Obesity - I have encouraged [name] to get into a routine exercise program and cut back on carbs and portions.   COVID-19 Education: The signs and symptoms of COVID-19 were discussed with the patient and how to seek care for testing (follow up with PCP or arrange E-visit).  The importance of social distancing was discussed today.  Patient Risk:   After full review of this patient's clinical status, I feel that they are at least moderate risk at this time.  Time:   Today, I have spent 20 minutes directly with the patient on telemedicine discussing medical problems including OSA, HTN, obesity.  We also reviewed the symptoms of COVID 19 and the ways to protect against contracting the virus with telehealth technology.  I spent an additional 5 minutes reviewing patient's chart including PAP compliance data.  Medication Adjustments/Labs and Tests Ordered: Current medicines are reviewed at length with the patient today.  Concerns regarding medicines are outlined above.  Tests Ordered: No orders of the defined types were placed in this encounter.  Medication Changes: No orders of the defined types were placed in this encounter.   Disposition:  Follow up in 1 year(s)  Signed, TracFransico [name]  01/15/2019 8:59 AM    [facility]

## 2019-01-15 ENCOUNTER — Telehealth: Payer: Self-pay | Admitting: *Deleted

## 2019-01-15 ENCOUNTER — Telehealth (INDEPENDENT_AMBULATORY_CARE_PROVIDER_SITE_OTHER): Payer: Medicare Other | Admitting: Cardiology

## 2019-01-15 ENCOUNTER — Telehealth: Payer: Self-pay

## 2019-01-15 ENCOUNTER — Other Ambulatory Visit: Payer: Self-pay | Admitting: *Deleted

## 2019-01-15 ENCOUNTER — Encounter: Payer: Self-pay | Admitting: Cardiology

## 2019-01-15 ENCOUNTER — Encounter: Payer: Self-pay | Admitting: *Deleted

## 2019-01-15 ENCOUNTER — Other Ambulatory Visit: Payer: Self-pay

## 2019-01-15 VITALS — BP 176/67 | Ht 76.0 in | Wt 290.0 lb

## 2019-01-15 DIAGNOSIS — M160 Bilateral primary osteoarthritis of hip: Secondary | ICD-10-CM

## 2019-01-15 DIAGNOSIS — F329 Major depressive disorder, single episode, unspecified: Secondary | ICD-10-CM

## 2019-01-15 DIAGNOSIS — N2581 Secondary hyperparathyroidism of renal origin: Secondary | ICD-10-CM

## 2019-01-15 DIAGNOSIS — E1159 Type 2 diabetes mellitus with other circulatory complications: Secondary | ICD-10-CM | POA: Diagnosis not present

## 2019-01-15 DIAGNOSIS — G4733 Obstructive sleep apnea (adult) (pediatric): Secondary | ICD-10-CM

## 2019-01-15 DIAGNOSIS — E1122 Type 2 diabetes mellitus with diabetic chronic kidney disease: Secondary | ICD-10-CM | POA: Diagnosis not present

## 2019-01-15 DIAGNOSIS — I69011 Memory deficit following nontraumatic subarachnoid hemorrhage: Secondary | ICD-10-CM | POA: Diagnosis not present

## 2019-01-15 DIAGNOSIS — I130 Hypertensive heart and chronic kidney disease with heart failure and stage 1 through stage 4 chronic kidney disease, or unspecified chronic kidney disease: Secondary | ICD-10-CM | POA: Diagnosis not present

## 2019-01-15 DIAGNOSIS — F419 Anxiety disorder, unspecified: Secondary | ICD-10-CM

## 2019-01-15 DIAGNOSIS — I69392 Facial weakness following cerebral infarction: Secondary | ICD-10-CM

## 2019-01-15 DIAGNOSIS — Z9989 Dependence on other enabling machines and devices: Secondary | ICD-10-CM

## 2019-01-15 DIAGNOSIS — I639 Cerebral infarction, unspecified: Secondary | ICD-10-CM | POA: Diagnosis not present

## 2019-01-15 DIAGNOSIS — Z89421 Acquired absence of other right toe(s): Secondary | ICD-10-CM

## 2019-01-15 DIAGNOSIS — E1169 Type 2 diabetes mellitus with other specified complication: Secondary | ICD-10-CM | POA: Diagnosis not present

## 2019-01-15 DIAGNOSIS — M179 Osteoarthritis of knee, unspecified: Secondary | ICD-10-CM

## 2019-01-15 DIAGNOSIS — I5032 Chronic diastolic (congestive) heart failure: Secondary | ICD-10-CM | POA: Diagnosis not present

## 2019-01-15 DIAGNOSIS — Z950 Presence of cardiac pacemaker: Secondary | ICD-10-CM

## 2019-01-15 DIAGNOSIS — E1142 Type 2 diabetes mellitus with diabetic polyneuropathy: Secondary | ICD-10-CM

## 2019-01-15 DIAGNOSIS — I674 Hypertensive encephalopathy: Secondary | ICD-10-CM | POA: Diagnosis not present

## 2019-01-15 DIAGNOSIS — I10 Essential (primary) hypertension: Secondary | ICD-10-CM

## 2019-01-15 DIAGNOSIS — E785 Hyperlipidemia, unspecified: Secondary | ICD-10-CM

## 2019-01-15 DIAGNOSIS — E1151 Type 2 diabetes mellitus with diabetic peripheral angiopathy without gangrene: Secondary | ICD-10-CM

## 2019-01-15 DIAGNOSIS — D692 Other nonthrombocytopenic purpura: Secondary | ICD-10-CM

## 2019-01-15 DIAGNOSIS — Z9641 Presence of insulin pump (external) (internal): Secondary | ICD-10-CM

## 2019-01-15 DIAGNOSIS — D509 Iron deficiency anemia, unspecified: Secondary | ICD-10-CM

## 2019-01-15 DIAGNOSIS — Z794 Long term (current) use of insulin: Secondary | ICD-10-CM

## 2019-01-15 DIAGNOSIS — I459 Conduction disorder, unspecified: Secondary | ICD-10-CM

## 2019-01-15 DIAGNOSIS — N184 Chronic kidney disease, stage 4 (severe): Secondary | ICD-10-CM | POA: Diagnosis not present

## 2019-01-15 DIAGNOSIS — Z7902 Long term (current) use of antithrombotics/antiplatelets: Secondary | ICD-10-CM

## 2019-01-15 DIAGNOSIS — Z9181 History of falling: Secondary | ICD-10-CM

## 2019-01-15 DIAGNOSIS — I421 Obstructive hypertrophic cardiomyopathy: Secondary | ICD-10-CM

## 2019-01-15 DIAGNOSIS — Z96659 Presence of unspecified artificial knee joint: Secondary | ICD-10-CM

## 2019-01-15 DIAGNOSIS — I4891 Unspecified atrial fibrillation: Secondary | ICD-10-CM | POA: Diagnosis not present

## 2019-01-15 DIAGNOSIS — Z85828 Personal history of other malignant neoplasm of skin: Secondary | ICD-10-CM

## 2019-01-15 DIAGNOSIS — E538 Deficiency of other specified B group vitamins: Secondary | ICD-10-CM

## 2019-01-15 DIAGNOSIS — N400 Enlarged prostate without lower urinary tract symptoms: Secondary | ICD-10-CM

## 2019-01-15 DIAGNOSIS — I25119 Atherosclerotic heart disease of native coronary artery with unspecified angina pectoris: Secondary | ICD-10-CM

## 2019-01-15 DIAGNOSIS — Z87891 Personal history of nicotine dependence: Secondary | ICD-10-CM

## 2019-01-15 NOTE — Telephone Encounter (Signed)
-----   Message from Nuala Alpha, LPN sent at 08/14/9377  9:07 AM EDT ----- Regarding: order from Dr. Radford Pax on this pts virtual from this morning. please arrange David [name] did a virtual on this sleep pt this morning and advised that I send you this staff message to arrange the following in this message.  I did place the 1 year recall in on him to see her for sleep in a year.   Thanks,  David [name]  ----- Message ----- From: Sueanne Margarita, MD Sent: 01/15/2019   9:01 AM EDT To: Nuala Alpha, LPN  Please call DME and have them change the ramp so that he has higher pressure when he first puts the device on.  Followup with me in 1 year.

## 2019-01-15 NOTE — Patient Outreach (Signed)
[place] [place] [facility]) Care Management [place] consult/ EMMI Red-flag notification  01/15/2019  STANFORD STRAUCH 1940-11-06 [id number]   RED ON EMMI ALERT- Post-hospital discharge day # 2 Date: 01/14/2019 Red Alert Reason: "No scheduled follow up"  Received EMMI red-flag notification this morning from [place]; confirmed that the EMMI call which alerted RED was placed to patient yesterday morning, 01/14/2019.  I spoke to patient's caregiver yesterday afternoon, 01/14/2019- and this issue was addressed at that time.  Call to caregiver today was deferred, as I spoke with her yesterday and addressed EMMI red flag proactively; [place] program was initiated at that time.  Please see specific details in [place] note dated 01/14/2019 for further information  Plan:  [place] outreach to continue as scheduled with caregiver during yesterday's phone call to initiate [place], RN, BSN, [place] Care Management  [phone]

## 2019-01-15 NOTE — Patient Instructions (Signed)
Medication Instructions:   Your physician recommends that you continue on your current medications as directed. Please refer to the Current Medication list given to you today.  If you need a refill on your cardiac medications before your next appointment, please call your pharmacy.     Follow-Up: At [facility], you and your health needs are our priority.  As part of our continuing mission to provide you with exceptional heart care, we have created designated Provider Care Teams.  These Care Teams include your primary Cardiologist (physician) and Advanced Practice Providers (APPs -  Physician Assistants and Nurse Practitioners) who all work together to provide you with the care you need, when you need it. You will need a follow up appointment in 12 months with Dr. Radford Pax for sleep follow-up.  Please call our office 2 months in advance to schedule this appointment.  You may see Dr. Radford Pax or one of the following Advanced Practice Providers on your designated Care Team:   [place], PA-C Melina Copa, PA-C . Ermalinda Barrios, PA-C  Any Other Special Instructions Will Be Listed Below (If Applicable).  NINA JONES OUR SLEEP STUDY COORDINATOR WILL BE CALLING YOUR DME TO HAVE THEM CHANGE THE RAMP SO THAT YOU HAVE PRESSURE WHEN YOU FIRST PUT THE DEVICE ON.  NINA WILL BE FOLLOWING UP WITH YOU AS WELL.

## 2019-01-15 NOTE — Telephone Encounter (Signed)
Received phone call from [place] named Cecille Rubin. She stated that pt was recently discharged home from the hospital. He was admitted for hypoglycemia, blood sugar of 35. His current dose of insulin is 70/30, taking 30 units BID. Please see blood sugar log that was placed on your desk with sugars since hospital discharge. Pt is currently not using Dexcom because he is out of sensors and waiting on delivery.  Lori,RN from [facility] is calling concerned about his insulin dose and the fact that he was requiring U500 insulin and now he seems to be having low blood sugar readings with premixed insulin. Pt informed RN that he usually eats dinner around 5-6 and has a snack around 7-8, and then goes to bed around 10-11. RN told pt to eat his night time snack a little later in an attempt to reduce the risk of hypoglycemia at HS. RN would also like a call back once MD reviews and changes are made if needed so they are aware and can monitor compliance. Direct line to Lori,RN is [phone].  Please review sugars and advise.

## 2019-01-15 NOTE — Telephone Encounter (Signed)
I will notify his DME to change is ramp so that he is getting more air when he turns on the device.  Reached out to Ma[place] and his pressure is set on 10. The ramp was not on so she cut it on. [employer] ask what pressure do you want the ramp over?

## 2019-01-15 NOTE — Telephone Encounter (Signed)
He will reduce his evening dose down to 20 units and continue 30 units of the 70/30 in the morning

## 2019-01-15 NOTE — Telephone Encounter (Signed)
Called pt's wife and gave her new insulin doses based on blood sugars that she reported. Pt's wife wrote these down and repeated them back correctly after writing them down. She did not verbalize and questions or concerns right now regarding the new dose. She did state that she has had significant issues with the Dexcom this past week and she may wait a little while longer before putting a new sensor on the pt because she feels more comfortable checking his blood sugar by finger stick right now. She also stated that she does not want the pt to be on the insulin again just yet. She is more comfortable giving him daily injections since she is not familiar with the pump, and the pt is not well enough at the moment to resume control of his medication regimen. Also called [facility] home health nurse, Cecille Rubin and left a voicemail for her with this information. Left my personal desk number on her voicemail to return my call if she should have any future questions.

## 2019-01-17 ENCOUNTER — Encounter: Payer: Self-pay | Admitting: Family Medicine

## 2019-01-17 ENCOUNTER — Other Ambulatory Visit: Payer: Self-pay | Admitting: Family Medicine

## 2019-01-17 ENCOUNTER — Other Ambulatory Visit: Payer: Self-pay | Admitting: Internal Medicine

## 2019-01-17 ENCOUNTER — Other Ambulatory Visit: Payer: Self-pay

## 2019-01-17 MED ORDER — PANTOPRAZOLE SODIUM 40 MG PO TBEC: 40.0000 mg | DELAYED_RELEASE_TABLET | Freq: Every day | ORAL | 1 refills | Status: DC

## 2019-01-17 NOTE — Telephone Encounter (Signed)
Reached out to patient to see how he was doing with the ramp turned on and he states he is getting more air when he puts his machine on and he can even sleep better when napping during the day. Pt is agreeable to treatment.

## 2019-01-17 NOTE — Telephone Encounter (Signed)
Rx sent 

## 2019-01-18 ENCOUNTER — Other Ambulatory Visit: Payer: Self-pay | Admitting: *Deleted

## 2019-01-18 DIAGNOSIS — E1159 Type 2 diabetes mellitus with other circulatory complications: Secondary | ICD-10-CM | POA: Diagnosis not present

## 2019-01-18 DIAGNOSIS — I674 Hypertensive encephalopathy: Secondary | ICD-10-CM | POA: Diagnosis not present

## 2019-01-18 DIAGNOSIS — E1122 Type 2 diabetes mellitus with diabetic chronic kidney disease: Secondary | ICD-10-CM | POA: Diagnosis not present

## 2019-01-18 DIAGNOSIS — N184 Chronic kidney disease, stage 4 (severe): Secondary | ICD-10-CM | POA: Diagnosis not present

## 2019-01-18 DIAGNOSIS — I5032 Chronic diastolic (congestive) heart failure: Secondary | ICD-10-CM | POA: Diagnosis not present

## 2019-01-18 DIAGNOSIS — I130 Hypertensive heart and chronic kidney disease with heart failure and stage 1 through stage 4 chronic kidney disease, or unspecified chronic kidney disease: Secondary | ICD-10-CM | POA: Diagnosis not present

## 2019-01-18 NOTE — Patient Outreach (Signed)
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  Patient was discharged home to self-care with home health services for RN, PT/OT/ speech pathology; CSW through [place] agency.  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  HIPAA/ identity verified with patient's caregiver today; explained purpose of call to address EMMI Red-alert notification from yesterday's automated EMMI call to patient.  Acknowledged with caregiver that we had already discussed this element of patient's assessment on 01/14/2019 at the time of our initial phone call to establish [facility] CM services.  Caregiver assures me today that nothing has changed, and she denies that patient has ongoing unmanageable depression.  Caregiver reports that patient's "spirits have gotten better every day" post-hospital discharge, and states that she believes patient "is doing much better."  Caregiver further confirms that she scheduled post-hospital PCP office visit for next week and that she is currently waiting to hear from neurology provider with office visit appointment date/ time.  She also adds that the home health team is actively involved in patient's care and confirms that he had visits from the home  health RN and OT this morning.  Plan: RN CM will close EMMI general discharge case as no further interventions needed at this time, and will continue to follow in [place] program, as previously scheduled, with telephone outreach next week post- scheduled PCP office visit.  Oneta Rack, RN, BSN, [facility] Care Management  [phone]

## 2019-01-22 DIAGNOSIS — E1159 Type 2 diabetes mellitus with other circulatory complications: Secondary | ICD-10-CM | POA: Diagnosis not present

## 2019-01-22 DIAGNOSIS — I5032 Chronic diastolic (congestive) heart failure: Secondary | ICD-10-CM | POA: Diagnosis not present

## 2019-01-22 DIAGNOSIS — E1122 Type 2 diabetes mellitus with diabetic chronic kidney disease: Secondary | ICD-10-CM | POA: Diagnosis not present

## 2019-01-22 DIAGNOSIS — N184 Chronic kidney disease, stage 4 (severe): Secondary | ICD-10-CM | POA: Diagnosis not present

## 2019-01-22 DIAGNOSIS — I130 Hypertensive heart and chronic kidney disease with heart failure and stage 1 through stage 4 chronic kidney disease, or unspecified chronic kidney disease: Secondary | ICD-10-CM | POA: Diagnosis not present

## 2019-01-22 DIAGNOSIS — I674 Hypertensive encephalopathy: Secondary | ICD-10-CM | POA: Diagnosis not present

## 2019-01-23 ENCOUNTER — Ambulatory Visit: Payer: Medicare Other | Admitting: *Deleted

## 2019-01-23 DIAGNOSIS — I5032 Chronic diastolic (congestive) heart failure: Secondary | ICD-10-CM | POA: Diagnosis not present

## 2019-01-23 DIAGNOSIS — I130 Hypertensive heart and chronic kidney disease with heart failure and stage 1 through stage 4 chronic kidney disease, or unspecified chronic kidney disease: Secondary | ICD-10-CM | POA: Diagnosis not present

## 2019-01-23 DIAGNOSIS — E1159 Type 2 diabetes mellitus with other circulatory complications: Secondary | ICD-10-CM | POA: Diagnosis not present

## 2019-01-23 DIAGNOSIS — I674 Hypertensive encephalopathy: Secondary | ICD-10-CM | POA: Diagnosis not present

## 2019-01-23 DIAGNOSIS — N184 Chronic kidney disease, stage 4 (severe): Secondary | ICD-10-CM | POA: Diagnosis not present

## 2019-01-23 DIAGNOSIS — E1122 Type 2 diabetes mellitus with diabetic chronic kidney disease: Secondary | ICD-10-CM | POA: Diagnosis not present

## 2019-01-24 ENCOUNTER — Ambulatory Visit (INDEPENDENT_AMBULATORY_CARE_PROVIDER_SITE_OTHER): Payer: Medicare Other | Admitting: Family Medicine

## 2019-01-24 ENCOUNTER — Encounter: Payer: Self-pay | Admitting: Family Medicine

## 2019-01-24 DIAGNOSIS — Z794 Long term (current) use of insulin: Secondary | ICD-10-CM | POA: Diagnosis not present

## 2019-01-24 DIAGNOSIS — I129 Hypertensive chronic kidney disease with stage 1 through stage 4 chronic kidney disease, or unspecified chronic kidney disease: Secondary | ICD-10-CM | POA: Diagnosis not present

## 2019-01-24 DIAGNOSIS — IMO0001 Reserved for inherently not codable concepts without codable children: Secondary | ICD-10-CM

## 2019-01-24 DIAGNOSIS — K219 Gastro-esophageal reflux disease without esophagitis: Secondary | ICD-10-CM | POA: Diagnosis not present

## 2019-01-24 DIAGNOSIS — N184 Chronic kidney disease, stage 4 (severe): Secondary | ICD-10-CM | POA: Diagnosis not present

## 2019-01-24 DIAGNOSIS — I152 Hypertension secondary to endocrine disorders: Secondary | ICD-10-CM

## 2019-01-24 DIAGNOSIS — E1159 Type 2 diabetes mellitus with other circulatory complications: Secondary | ICD-10-CM

## 2019-01-24 DIAGNOSIS — R413 Other amnesia: Secondary | ICD-10-CM | POA: Diagnosis not present

## 2019-01-24 NOTE — Assessment & Plan Note (Signed)
Continue Protonix 40 mg daily

## 2019-01-24 NOTE — Progress Notes (Signed)
Chief Complaint:  David [name] is a 78 y.o. male who presents today for a TCM visit.  Assessment/Plan:  Hypoglycemia No recurrent issues.  We will continue management of his diabetes per endocrinology.  Discussed warning signs and reasons to return to care.  Hypertension associated with diabetes ([place]) Continue amlodipine 5 mg daily, Coreg 25 mg twice daily, hydralazine 100 mg 3 times daily, and Imdur 30 mg daily.  GERD (gastroesophageal reflux disease) Continue Protonix 40 mg daily.  Memory change We will follow-up with neurology as previously planned.  CKD (chronic kidney disease) stage 4, GFR 15-29 ml/min (HCC) Had mild AKI during hospitalization that has resolved.  Advised him to follow-up with nephrology soon.  IDDM (insulin dependent diabetes mellitus) - on insulin pump Continue 70/30 regimen 30 units twice daily and Victoza 1.8 mg daily.  Continue management per endocrinology.     Subjective:  HPI:  Summary of Hospital admission: Reason for admission: Hypoglycemia, encephalopathy Date of admission: 01/09/2019 Date of discharge: 01/12/2019 Date of Interactive contact: 01/14/2019 Summary of Hospital course: Patient presented to the hospital on 01/09/2019 due to hypoglycemia and altered mental status.  Previous to his most recent hospitalization he was also hospitalized earlier last month for concern for stroke.  Had neuro work-up at that time including MRI which was negative for stroke.  Neurology was consulted.  Thought that his presentation at time was most likely due to hypertensive encephalopathy.  He has plan to follow-up with neurology as an outpatient.  Fortunately, patient did not improve after first hospitalization.  On his second admission, patient was found to be using U500 insulin for mealtime insulin which was thought to be the main reason for his hypoglycemia and altered mental status.  His mental status improved throughout his hospital stay with good glycemic  control.  He was discharged on 70/30 30 units twice daily  Interim history outlined by problem:   # Hypoglycemia / T2DM -On 70/30 Relion 38 units twice daily. - On Victoza 1.8 mg daily. -Home sugars usually in the low 200s, though seem to be improving - Has been working on eating healthier and being more active. -No reported lows.  No polyuria or polydipsia  # Confusion - Improving since being discharged.  Has appoint with neurology scheduled next month.  # Essential Hypertension -Amlodipine was decreased to 5 mg at recent hospitalization. - He is also on Coreg 25 mg twice daily, hydralazine 100 mg 3 times daily, Imdur 30 mg daily, and torsemide 40 mg every other day - Home BPs 140s/80s - ROS: No reported chest pain or shortness of breath.  # GERD -On Protonix 40 mg daily.  Tolerating well.  ROS: , otherwise a complete review of systems was negative.   PMH:  The following were reviewed and entered/updated in epic: Past Medical History:  Diagnosis Date  . Acute cholecystitis s/p lap cholecystectomy 03/05/2017 03/04/2017  . Anemia, iron deficiency   . Anxiety   . Arthritis   . Atrial fibrillation with rapid ventricular response ([place])   . BPH (benign prostatic hypertrophy)   . CAD (coronary artery disease)    Nonobstructive CAD per cath  . Cardiac pacemaker in situ   . CHF (congestive heart failure) ([place])   . Chronic ulcer of right foot ([place])   . CKD (chronic kidney disease) stage 4, GFR 15-29 ml/min (HCC) 09/26/2017  . CKD (chronic kidney disease), stage III ([place]) secondary to DM and HTN   nephrologist-  [name]  . Coronary  artery disease involving native coronary artery of native heart without angina pectoris   . Diastolic dysfunction   . Dyspnea   . 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 History of cellulitis    right great toe 10-25-2014  . History of skin cancer   . HOCM (hypertrophic obstructive cardiomyopathy) ([place])    . Hyperlipidemia associated with type 2 diabetes mellitus ([place]) 10/25/2013  . Hypertension   . Hypertension associated with diabetes ([place]) 10/25/2013  . 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 Insulin dependent type 2 diabetes mellitus ([place]) 1991   followd by [name]--  has insulin pump  . Insulin pump in place   . OSA on CPAP   . 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 Peripheral neuropathy    severe  . Peripheral vascular disease ([place])    bilateral lower extremities  . PVD (peripheral vascular disease) ([place])   . Rib fracture 07/24/2015  . Seasonal and perennial allergic rhinitis 10/25/2013  . Seborrheic keratosis 09/19/2016  . Secondary hyperparathyroidism of renal origin ([place])   . 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 Sinus node dysfunction (HCC)   . Sleep apnea   . Syncope 07/24/2015   Patient Active Problem List   Diagnosis Date Noted  . GERD (gastroesophageal reflux disease) 01/24/2019  . Memory change 11/19/2018  . Senile purpura ([place]) 11/27/2017  . CKD (chronic kidney disease) stage 4, GFR 15-29 ml/min (HCC) 09/26/2017  . 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 Nonobstructive CAD s/p PCI 2012   . Cardiac pacemaker in situ   . PVD (peripheral vascular disease) ([place])   . Anxiety 03/22/2017  . Acute cholecystitis s/p lap cholecystectomy 03/05/2017 03/04/2017  . 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[place]) 03/04/2017  . 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 Fatigue 01/27/2017  . Low vitamin B12 level 01/27/2017  . OSA on CPAP 10/26/2014  . Seasonal and perennial allergic rhinitis 10/25/2013  . Hypertension associated with diabetes ([place]) 10/25/2013  . Hyperlipidemia associated with type 2 diabetes mellitus ([place]) 10/25/2013   Past Surgical History:  Procedure Laterality Date  . AMPUTATION OF REPLICATED TOES  Mar 9242   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)  . AMPUTATION TOE Right  03/12/2015   Procedure: RIGHT HALLUS AMPUTATION ;  Surgeon: [name], MD;  Location: [facility];  Service: Podiatry;  Laterality: Right;  . CARDIAC CATHETERIZATION  11-25-2010   [place], [place]   Nonobstructive CAD  . CARDIAC PACEMAKER PLACEMENT  Nov 2009   [employer]  . CHOLECYSTECTOMY N/A 03/05/2017   Procedure: LAPAROSCOPIC CHOLECYSTECTOMY WITH INTRAOPERATIVE CHOLANGIOGRAM;  Surgeon: [name], MD;  Location: [facility];  Service: General;  Laterality: N/A;  . EP IMPLANTABLE DEVICE N/A 06/03/2015   Procedure: PPM Generator Changeout;  Surgeon: [name], MD;  Location: [facility];  Service: Cardiovascular;  Laterality: N/A;  . EXCISION BONE CYST Right 03/06/2015   Procedure: BONE BIOPSIES OF RIGHT FOOT;  Surgeon: [name], MD;  Location: [facility];  Service: Podiatry;  Laterality: Right;  . ORIF ANKLE FRACTURE Left 11/06/2014   Procedure: OPEN REDUCTION INTERNAL FIXATION (ORIF) LEFT  ANKLE FRACTURE;  Surgeon: [name], MD;  Location: [facility];  Service: Orthopedics;  Laterality: Left;  . TEE WITHOUT CARDIOVERSION N/A 09/01/2017   Procedure: TRANSESOPHAGEAL ECHOCARDIOGRAM (TEE);  Surgeon: [name], MD;  Location: [facility];  Service:  Cardiovascular;  Laterality: N/A;  . TOTAL KNEE ARTHROPLASTY    . VEIN LIGATION AND STRIPPING      Family History  Problem Relation Age of Onset  . Cancer Mother        breast  . Heart attack Father   . Stroke Father     Medications- Reconciled discharge and current medications in Epic.  Current Outpatient Medications  Medication Sig Dispense Refill  . acetaminophen (TYLENOL) 325 MG tablet Take 1-2 tablets (325-650 mg total) by mouth every 4 (four) hours as needed for mild pain.    Marland Kitchen amLODipine (NORVASC) 5 MG tablet Take 1 tablet (5 mg total) by mouth daily. 30 tablet 0  . atorvastatin (LIPITOR) 10 MG tablet TAKE 1 TABLET BY MOUTH ONCE DAILY AT  6PM 90 tablet 0  . blood  glucose meter kit and supplies KIT Dispense based on patient and insurance preference. Use up to four times daily as directed. (FOR ICD-9 250.00, 250.01). 1 each 0  . calcitRIOL (ROCALTROL) 0.25 MCG capsule Take 1 capsule by mouth once daily 90 capsule 0  . carvedilol (COREG) 25 MG tablet TAKE 1 TABLET BY MOUTH TWICE DAILY WITH A MEAL 180 tablet 0  . cholecalciferol (VITAMIN D) 1000 units tablet Take 1 tablet (1,000 Units total) by mouth daily. 30 tablet 1  . clopidogrel (PLAVIX) 75 MG tablet Take 1 tablet by mouth once daily 90 tablet 0  . diclofenac sodium (VOLTAREN) 1 % GEL Apply 2 g topically 4 (four) times daily. 150 g 0  . glucose blood test strip Use Contour test strips as instructed to check blood sugar four times daily. 200 each 2  . hydrALAZINE (APRESOLINE) 100 MG tablet Take 100 mg by mouth 3 (three) times daily. Take 1 tablet by mouth three times daily.    . Insulin Isophane & Regular Human (NOVOLIN 70/30 FLEXPEN RELION) (70-30) 100 UNIT/ML PEN Inject 30 Units into the skin 2 (two) times daily before a meal. 18 mL 0  . Insulin Syringe-Needle U-100 (INSULIN SYRINGE 1CC/31GX5/16") 31G X 5/16" 1 ML MISC 1 each by Does not apply route 3 (three) times daily. Use insulin syringe to inject insulin three times daily. 100 each 2  . isosorbide mononitrate (IMDUR) 30 MG 24 hr tablet Take 2 tablets by mouth once daily 180 tablet 3  . liraglutide (VICTOZA) 18 MG/3ML SOPN Inject 0.3 mLs (1.8 mg total) into the skin daily before supper. 9 pen 1  . pantoprazole (PROTONIX) 40 MG tablet Take 1 tablet (40 mg total) by mouth daily. 90 tablet 1  . sertraline (ZOLOFT) 100 MG tablet Take 100 mg by mouth daily.    Marland Kitchen torsemide (DEMADEX) 20 MG tablet Take 2 tablets (21m) by mouth every OTHER day. 180 tablet 3  . vitamin B-12 (CYANOCOBALAMIN) 100 MCG tablet Take 100 mcg by mouth daily.     No current facility-administered medications for this visit.     Allergies-reviewed and updated Allergies  Allergen  Reactions  . Ativan [Lorazepam] Anxiety    Pt gets more agitated  . Adhesive [Tape] Other (See Comments)    blisters    Social History   Socioeconomic History  . Marital status: Married    Spouse name: Not on file  . Number of children: 3  . Years of education: 136 . Highest education level: Not on file  Occupational History  . Occupation: Retired  SScientific laboratory technician . Financial resource strain: Not hard at all  . Food insecurity  Worry: Never true    Inability: Never true  . Transportation needs    Medical: No    Non-medical: No  Tobacco Use  . Smoking status: Former Smoker    Packs/day: 2.00    Years: 30.00    Pack years: 60.00    Quit date: 03/03/1984    Years since quitting: 34.9  . Smokeless tobacco: Never Used  Substance and Sexual Activity  . Alcohol use: Yes    Alcohol/week: 1.0 standard drinks    Types: 1 Glasses of wine per week    Comment: social  . Drug use: No  . Sexual activity: Not Currently  Lifestyle  . Physical activity    Days per week: Not on file    Minutes per session: Not on file  . Stress: Not on file  Relationships  . Social Herbalist on phone: Not on file    Gets together: Not on file    Attends religious service: Not on file    Active member of club or organization: Not on file    Attends meetings of clubs or organizations: Not on file    Relationship status: Not on file  Other Topics Concern  . Not on file  Social History Narrative   Currently resides with his wife. 1 dog. Fun/Hobby: Golf    Denies any religious beliefs effecting health care.         Objective:  Physical Exam: There were no vitals taken for this visit.  Gen: NAD, resting comfortably MSK: No edema, cyanosis, or clubbing noted Skin: Warm, dry Neuro: Grossly normal, moves all extremities Psych: Normal affect and thought content  Virtual Visit via Video   I connected with David [name] on 01/24/19 at  1:00 PM EDT by a video enabled telemedicine  application and verified that I am speaking with the correct person using two identifiers. I discussed the limitations of evaluation and management by telemedicine and the availability of in person appointments. The patient expressed understanding and agreed to proceed.   Patient location: Home Provider location: [place] participating in the virtual visit: Myself, Patient, and Patient's Wife.     [name], MD 01/24/2019 12:13 PM

## 2019-01-24 NOTE — Assessment & Plan Note (Signed)
Had mild AKI during hospitalization that has resolved.  Advised him to follow-up with nephrology soon.

## 2019-01-24 NOTE — Assessment & Plan Note (Signed)
Continue amlodipine 5 mg daily, Coreg 25 mg twice daily, hydralazine 100 mg 3 times daily, and Imdur 30 mg daily.

## 2019-01-24 NOTE — Assessment & Plan Note (Signed)
We will follow-up with neurology as previously planned.

## 2019-01-24 NOTE — Assessment & Plan Note (Signed)
Continue 70/30 regimen 30 units twice daily and Victoza 1.8 mg daily.  Continue management per endocrinology.

## 2019-01-25 ENCOUNTER — Other Ambulatory Visit: Payer: Self-pay | Admitting: *Deleted

## 2019-01-25 ENCOUNTER — Encounter: Payer: Self-pay | Admitting: *Deleted

## 2019-01-25 NOTE — Patient Outreach (Signed)
[facility]) Care [name] CM Multidisciplinary case discussion 01/25/2019  David [name] [birth year] [id number]  [facility] CM Multidisciplinary Case Discussion re:  David [name], 78 y/o male referred to [place] by [facility] Liaison RN CM after 2 recent hospitalizations August 22-24, 2020 for AMS/ neurological changes suspected hypertensive encephalopathy and then again on August 26-29, 2020 for AMS/ confusion and hypoglycemia.  Patient was discharged home to self-care with home health services for RN, PT/OT/ speech pathology; CSW through [facility] agency.  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  Barriers: -- recent but gradual mental decline post CVA in 2019- unknown etiology -- 2 recent hospitalizations with resultant weakness/ fatigue  THN CM Team Discussion: -- very supportive family that all participate in patient's care and are actively involved -- neuro and endocrinology provider office visits pending- scheduled for October -- home health services active -- no concerns identified with assessment of SDOH/ initial assessment + [place] EMMI calls  Plan: -- continue to follow closely; re-present to case discussion as indicated  David Rack, RN, BSN, David [name]  [phone]

## 2019-01-25 NOTE — Patient Outreach (Signed)
[facility]) [place] Telephone Outreach to caregiver PCP completes Transition of Care follow up post-hospital discharge Post-hospital discharge day # 13   01/25/2019  EUGEAN ARNOTT 05/18/1940 [id number]  Successful telephone outreach to David [name], wife/ caregiver, on [place] for David [name], 78 y/o male referred to [place] by [facility] Liaison RN CM after 2 recent hospitalizations August 22-24, 2020 for AMS/ neurological changes suspected hypertensive encephalopathy and then again on August 26-29, 2020 for AMS/ confusion and hypoglycemia.  Patient was discharged home to self-care with home health services for RN, PT/OT/ speech pathology; CSW through [facility] [place] agency.  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  HIPAA/ identity verified with patient's caregiver today and she again reports that "things are going very well/ much better."  She continues to report that patient's "spirits are good," and she denies that patient is in pain today or has had new/ recent falls.  Reports patient has continued using his cane for all ambulation.  Continues to report "great support" from her adult children to ensure that patient is supervised at all times.  Caregiver denies clinical concerns today and states that she and patient "had a great visit with his PCP yesterday."  Pleasant call.  Caregiver further reports:  -- Has all medicationsand takes as prescribed;denies questions about current medications and confirms that no changes were made as a result of PCP office visit yesterday; she does report that PCP has instructed her to continue increasing patient's insulin 70/30 "very gradually" to decrease higher blood sugars since he has been off of his insulin pump.  Reports that insulin  is now being given to patient at 40-42 units with each dose; this was updated accordingly in medication list in EMR.  Caregiver continues managing all aspects of patient's medications since he has been home from his recent hospitalization  -- home health services continue through [facility], and "are going great;" reports patient has responded "very well" to home health PT sessions, and she states that his chronic knee pain "has gone away," stating that "his knee popped one day while I was cleaning his feet, and he has not had any pain since then."  All recent home health visits were reviewed with caregiver, who confirms that she has the contact information for the home health team. -- All recent/ upcoming provider appointments were reviewed with caregiver today; caregiver verbalizes accurate understanding of scheduled appointments; reports family members will assist with transportting patient to scheduled appointments.  Attended virtual visit with PCP yesterday and post-visit instructions were reviewed with caregiver today- who verbalizes accurate understanding of overall plan of care.  States that PCP is working with patient to obtain new endocrinology provider referral; but states that patient will remain with his established endocrinology provider until this change has been made ---- scheduled endocrinology appointment (with current endocrinologist) 03/05/2019 ---- scheduled neurology provider visit scheduled 03/06/2019  Self-health management of chronic disease state of DM: -- confirms that she continues managing patient's blood sugar monitoring and insulin injections -- noted blood sugars have been increased since changes made to transition patient away from insulin pump- states PCP provided instructions to slowly increase 70/30 dosing to prevent hypoglycemia and to slowly decrease blood sugars -- confirmed that she received printed educational material around hypoglycemia and fall  prevention- these were  reviewed with caregiver today -- spouse continues to check patient's blood sugar 4 times per day- reports patient can no longer participate in his management of DM due to memory issues, and states that "he has now accepted this." -- reports blood sugars "more stable but running higher" since recent hospital discharge- reports fasting blood sugar rangess between 220-260 and post-prandial blood sugars between 180- 270, consistently; again reports patient's blood sugar "overall goal" as " all numbers no higher than 200," but states that patient's PCP expected his sugars to run higher now that he is off insulin pump.  Verbalizes plans to keep patient's PCP aware of blood sugar trends at home so changes can be made accordingly.  States patient occasionally has fasting blood sugars > 300 but adds that this is "not often."  Reports fasting blood sugar today of "311." -- reviewed signs/ symptoms hypoglycemia with caregiver along with corresponding action plan for same; caregiver denies that patient has experienced any further episodes of hypoglycemia since our last conversation.  Caregiver denies further issues, concerns, or problems today. I confirmed that she and patient hasmy direct phone number, the main [facility] CM office phone number, and the [facility] CM 24-hour nurse advice phone number should issues arise prior to next scheduled [place] outreach which we scheduled in 2 weeks.  Encouraged caregiver to contact me directly if needs, questions, issues, or concerns arise prior to next scheduled outreach; patient agreed to do so.  Plan:  Patient will take medications as prescribed and will attend all scheduled provider appointments  Patient/ caregiver will promptly notify care providers for any new concerns/ issues/ problems that arise  Patient will actively participate in home health services as ordered post-hospital discharge  Patient/ caregiver will continue monitoring/ recording daily blood  sugars 4 times per day   I will share today's notes/ care plan with patient's PCP as [facility] RN CM initial assessment  [place] outreach to continue with scheduled phone call to caregiver in 2 weeks   [facility] CM Care Plan Problem One     Most Recent Value  Care Plan Problem One  High risk for hospital readmission related to/ as evidenced by two recent hospitalizations for AMs/ hypoglycemia  Role Documenting the Problem One  Care Management Coordinator  Care Plan for Problem One  Active  THN Long Term Goal   Over the next 31 days, patient will not experience hospiatl readmission, as evidenced by caregiver reporting and review of EMR during [facility] RN CM outreach  [facility] Long Term Goal Start Date  01/14/19  Interventions for Problem One Long Term Goal  Discussed with caregiver patient's current clinical condition and confirmed that she has no current clinical concerns,  reviewed recent blood sugars at home and discussed plan for blood sugar management with caregiver,  initial assessment completed and sent to PCP  [facility] CM Short Term Goal #1   Over the next 30 days, patient will actively participate in home health services as ordered post-hospital discharge, as evidenced by caregiver reporting and colaboration with home health team as indicated during [facility] RN CM outreach  [facility] CM Short Term Goal #1 Start Date  01/14/19  Interventions for Short Term Goal #1  Confirmed that home health sevrices remain actively involved in patient's care and reviewed recent visits with caregiver,  confirmed that she has contact information for home health team  [facility] CM Short Term Goal #2   Over the next 30 days, patient  will attend all scheduled provider appointments, as evidenced by caregiver reporting and collaboration as indicated with care providers during [place] outreach  [facility] CM Short Term Goal #2 Start Date  01/14/19  Interventions for Short Term Goal #2  Reviewed yesterday's PCP office visit with caregiver  and discussed overall plan of care,  reviewed upcoming appointments with caregiver and confirmed that she has accurate understanding of same and plans to transport/ attend all visits with patient     Oneta Rack, RN, BSN, CCRN [facility] Care Management  [phone]

## 2019-01-30 ENCOUNTER — Encounter: Payer: Self-pay | Admitting: Family Medicine

## 2019-01-31 ENCOUNTER — Telehealth: Payer: Self-pay | Admitting: Family Medicine

## 2019-01-31 ENCOUNTER — Other Ambulatory Visit: Payer: Self-pay

## 2019-01-31 DIAGNOSIS — N184 Chronic kidney disease, stage 4 (severe): Secondary | ICD-10-CM | POA: Diagnosis not present

## 2019-01-31 DIAGNOSIS — I5032 Chronic diastolic (congestive) heart failure: Secondary | ICD-10-CM | POA: Diagnosis not present

## 2019-01-31 DIAGNOSIS — I674 Hypertensive encephalopathy: Secondary | ICD-10-CM | POA: Diagnosis not present

## 2019-01-31 DIAGNOSIS — I130 Hypertensive heart and chronic kidney disease with heart failure and stage 1 through stage 4 chronic kidney disease, or unspecified chronic kidney disease: Secondary | ICD-10-CM | POA: Diagnosis not present

## 2019-01-31 DIAGNOSIS — E1122 Type 2 diabetes mellitus with diabetic chronic kidney disease: Secondary | ICD-10-CM | POA: Diagnosis not present

## 2019-01-31 DIAGNOSIS — E1159 Type 2 diabetes mellitus with other circulatory complications: Secondary | ICD-10-CM | POA: Diagnosis not present

## 2019-01-31 MED ORDER — NOVOLIN 70/30 FLEXPEN RELION (70-30) 100 UNIT/ML ~~LOC~~ SUPN: 30.0000 [IU] | PEN_INJECTOR | Freq: Two times a day (BID) | SUBCUTANEOUS | 2 refills | Status: DC

## 2019-01-31 NOTE — Telephone Encounter (Signed)
Home Health Verbal Orders - Caller/Agency: [facility] Number: [phone] Requesting OT/PT/Skilled Nursing/Social Work/Speech Therapy: Currently has been taking novaline 7030 insulin 40 units twice daily, client wants a refill.  Frequency:

## 2019-02-01 ENCOUNTER — Encounter: Payer: Self-pay | Admitting: Family Medicine

## 2019-02-01 NOTE — Telephone Encounter (Signed)
Verbal ok for Skilled Nursing only,refused other services.

## 2019-02-06 DIAGNOSIS — E1122 Type 2 diabetes mellitus with diabetic chronic kidney disease: Secondary | ICD-10-CM | POA: Diagnosis not present

## 2019-02-06 DIAGNOSIS — D509 Iron deficiency anemia, unspecified: Secondary | ICD-10-CM | POA: Diagnosis not present

## 2019-02-06 DIAGNOSIS — E1159 Type 2 diabetes mellitus with other circulatory complications: Secondary | ICD-10-CM | POA: Diagnosis not present

## 2019-02-06 DIAGNOSIS — Z8673 Personal history of transient ischemic attack (TIA), and cerebral infarction without residual deficits: Secondary | ICD-10-CM | POA: Diagnosis not present

## 2019-02-06 DIAGNOSIS — I674 Hypertensive encephalopathy: Secondary | ICD-10-CM | POA: Diagnosis not present

## 2019-02-06 DIAGNOSIS — M869 Osteomyelitis, unspecified: Secondary | ICD-10-CM | POA: Diagnosis not present

## 2019-02-06 DIAGNOSIS — I5032 Chronic diastolic (congestive) heart failure: Secondary | ICD-10-CM | POA: Diagnosis not present

## 2019-02-06 DIAGNOSIS — I130 Hypertensive heart and chronic kidney disease with heart failure and stage 1 through stage 4 chronic kidney disease, or unspecified chronic kidney disease: Secondary | ICD-10-CM | POA: Diagnosis not present

## 2019-02-06 DIAGNOSIS — N183 Chronic kidney disease, stage 3 (moderate): Secondary | ICD-10-CM | POA: Diagnosis not present

## 2019-02-06 DIAGNOSIS — N2581 Secondary hyperparathyroidism of renal origin: Secondary | ICD-10-CM | POA: Diagnosis not present

## 2019-02-06 DIAGNOSIS — Z6834 Body mass index (BMI) 34.0-34.9, adult: Secondary | ICD-10-CM | POA: Diagnosis not present

## 2019-02-06 DIAGNOSIS — N184 Chronic kidney disease, stage 4 (severe): Secondary | ICD-10-CM | POA: Diagnosis not present

## 2019-02-06 DIAGNOSIS — R809 Proteinuria, unspecified: Secondary | ICD-10-CM | POA: Diagnosis not present

## 2019-02-06 DIAGNOSIS — I129 Hypertensive chronic kidney disease with stage 1 through stage 4 chronic kidney disease, or unspecified chronic kidney disease: Secondary | ICD-10-CM | POA: Diagnosis not present

## 2019-02-06 DIAGNOSIS — N401 Enlarged prostate with lower urinary tract symptoms: Secondary | ICD-10-CM | POA: Diagnosis not present

## 2019-02-06 DIAGNOSIS — E875 Hyperkalemia: Secondary | ICD-10-CM | POA: Diagnosis not present

## 2019-02-07 ENCOUNTER — Encounter: Payer: Self-pay | Admitting: Family Medicine

## 2019-02-07 DIAGNOSIS — N179 Acute kidney failure, unspecified: Secondary | ICD-10-CM | POA: Diagnosis not present

## 2019-02-07 DIAGNOSIS — E1142 Type 2 diabetes mellitus with diabetic polyneuropathy: Secondary | ICD-10-CM | POA: Diagnosis not present

## 2019-02-07 DIAGNOSIS — E1159 Type 2 diabetes mellitus with other circulatory complications: Secondary | ICD-10-CM | POA: Diagnosis not present

## 2019-02-07 DIAGNOSIS — I4891 Unspecified atrial fibrillation: Secondary | ICD-10-CM | POA: Diagnosis not present

## 2019-02-07 DIAGNOSIS — G9341 Metabolic encephalopathy: Secondary | ICD-10-CM | POA: Diagnosis not present

## 2019-02-07 DIAGNOSIS — I69311 Memory deficit following cerebral infarction: Secondary | ICD-10-CM | POA: Diagnosis not present

## 2019-02-07 DIAGNOSIS — I421 Obstructive hypertrophic cardiomyopathy: Secondary | ICD-10-CM | POA: Diagnosis not present

## 2019-02-07 DIAGNOSIS — L97511 Non-pressure chronic ulcer of other part of right foot limited to breakdown of skin: Secondary | ICD-10-CM | POA: Diagnosis not present

## 2019-02-07 DIAGNOSIS — I130 Hypertensive heart and chronic kidney disease with heart failure and stage 1 through stage 4 chronic kidney disease, or unspecified chronic kidney disease: Secondary | ICD-10-CM | POA: Diagnosis not present

## 2019-02-07 DIAGNOSIS — I69318 Other symptoms and signs involving cognitive functions following cerebral infarction: Secondary | ICD-10-CM | POA: Diagnosis not present

## 2019-02-07 DIAGNOSIS — E1151 Type 2 diabetes mellitus with diabetic peripheral angiopathy without gangrene: Secondary | ICD-10-CM | POA: Diagnosis not present

## 2019-02-07 DIAGNOSIS — I69392 Facial weakness following cerebral infarction: Secondary | ICD-10-CM | POA: Diagnosis not present

## 2019-02-07 DIAGNOSIS — I700 Atherosclerosis of aorta: Secondary | ICD-10-CM | POA: Diagnosis not present

## 2019-02-07 DIAGNOSIS — D631 Anemia in chronic kidney disease: Secondary | ICD-10-CM | POA: Diagnosis not present

## 2019-02-07 DIAGNOSIS — G4733 Obstructive sleep apnea (adult) (pediatric): Secondary | ICD-10-CM | POA: Diagnosis not present

## 2019-02-07 DIAGNOSIS — N184 Chronic kidney disease, stage 4 (severe): Secondary | ICD-10-CM | POA: Diagnosis not present

## 2019-02-07 DIAGNOSIS — E11621 Type 2 diabetes mellitus with foot ulcer: Secondary | ICD-10-CM | POA: Diagnosis not present

## 2019-02-07 DIAGNOSIS — I25119 Atherosclerotic heart disease of native coronary artery with unspecified angina pectoris: Secondary | ICD-10-CM | POA: Diagnosis not present

## 2019-02-07 DIAGNOSIS — I69354 Hemiplegia and hemiparesis following cerebral infarction affecting left non-dominant side: Secondary | ICD-10-CM | POA: Diagnosis not present

## 2019-02-07 DIAGNOSIS — E1122 Type 2 diabetes mellitus with diabetic chronic kidney disease: Secondary | ICD-10-CM | POA: Diagnosis not present

## 2019-02-07 DIAGNOSIS — I495 Sick sinus syndrome: Secondary | ICD-10-CM | POA: Diagnosis not present

## 2019-02-07 DIAGNOSIS — I459 Conduction disorder, unspecified: Secondary | ICD-10-CM | POA: Diagnosis not present

## 2019-02-07 DIAGNOSIS — E11649 Type 2 diabetes mellitus with hypoglycemia without coma: Secondary | ICD-10-CM | POA: Diagnosis not present

## 2019-02-07 DIAGNOSIS — N2581 Secondary hyperparathyroidism of renal origin: Secondary | ICD-10-CM | POA: Diagnosis not present

## 2019-02-07 DIAGNOSIS — I5032 Chronic diastolic (congestive) heart failure: Secondary | ICD-10-CM | POA: Diagnosis not present

## 2019-02-07 MED ORDER — AMLODIPINE BESYLATE 5 MG PO TABS: 5.0000 mg | ORAL_TABLET | Freq: Every day | ORAL | 0 refills | Status: DC

## 2019-02-08 DIAGNOSIS — H35372 Puckering of macula, left eye: Secondary | ICD-10-CM | POA: Diagnosis not present

## 2019-02-08 DIAGNOSIS — E113313 Type 2 diabetes mellitus with moderate nonproliferative diabetic retinopathy with macular edema, bilateral: Secondary | ICD-10-CM | POA: Diagnosis not present

## 2019-02-11 ENCOUNTER — Other Ambulatory Visit: Payer: Self-pay | Admitting: *Deleted

## 2019-02-11 ENCOUNTER — Encounter: Payer: Self-pay | Admitting: *Deleted

## 2019-02-11 NOTE — Patient Outreach (Signed)
[facility]) Care Management [place] Telephone Outreach PCP completes Transition of Care outreach post-hospital discharge Post-hospital discharge day # 30  02/11/2019  David [name] [birth year] [id number]  Successful telephone outreach toKaren Georgana [name], wife/ caregiver, on [facility] DPR David [name], 78 y/o male referred to [facility] Liaison RN [place] 2 recent hospitalizations August 22-24, 2020 for AMS/ neurological changes suspected hypertensive encephalopathy and then again on August 26-29, 2020 for AMS/ confusion and hypoglycemia. Patient was discharged home to self-care with home health services for RN, PT/OT/ speech pathology; CSW through [place] agency. 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  HIPAA/ identity verified with patient's caregiver today and she reports that "things are going great."  She denies that patient is in pain today or has had new/ recent falls.  Reports patient has continued using his cane for all ambulation.  Continues to report "great support" from her adult children to ensure that patient is supervised at all times, stating that her children have placed a web-based camera monitoring system in patient's room so that he can be observed at all times.  Caregiver denies clinical concerns today.  Pleasant call.  Caregiver further reports:  -- Has all medicationsand continues taking as prescribed;denies questions about current medications and confirms that no changes have been recently made to medications.  Medications for DM reviewed with caregiver today/ medication list in EMR updated accordingly.  Caregiver continues to manage/ supervise all of patient's medications  -- All upcoming provider appointments were reviewed withcaregivertoday;  caregiver verbalizes accurate understanding of scheduled appointments; reports family members will continue to assist with transportting patient to scheduled appointments.  Has scheduled upcoming endocrinology appointment with established provider; still awaiting to hear from referral placed to new endocrinology provider by PCP; states they will attend appointment with established provider if the new referral is not in place by scheduled appointment time.  Self-health management ofchronic disease state of DM: --confirms that she continues managing patient's blood sugar monitoring and insulin injections -- noted blood sugars have continued to be increased since changes made to transition patient away from insulin pump- reports fasting blood sugar ranges between "180-220" and post-prandial ranges "usually" in 160-170 ranges; states patient has had occasional episodes hypoglycemia "below 90, but not lower than 70" "only at night.  Reports giving patient night time snack, which "has helped."  Reports this has occurred approximately "every week-10 days."  Reports that patient has signs/ symptoms low blood sugar for "any reading below 90" and adds that patient "will tell" her when he is low. -- reviewed signs/ symptoms hypoglycemia with caregiver along with corresponding action plan for same; caregiver verbalizes appropriate action plan for hypoglycemia -- caregiver continues monitoring/ recording daily blood sugars 3-4 times per day  Caregiverdenies further issues, concerns, or problems today. Iconfirmed thatshe andpatient hasmy direct phone number, the main [facility] CM office phone number, and the [facility] CM 24-hour nurse advice phone number should issues arise prior to next scheduled [facility] Community CM outreachnext month. Encouraged caregiverto contact me directly if needs, questions, issues, or concerns arise prior to next scheduled outreach; caregiver agreed to do so.  Plan:  Patient will take  medications as prescribed and will attend all scheduled provider appointments  Patient/ caregiverwill promptly notify care providers for any new concerns/ issues/ problems  that arise  Patient will actively participate in home health services as ordered post-hospital discharge  Patient/ caregiverwill continue monitoring/ recording daily blood sugars 4 times per day  [facility] Community CM outreach to continue with scheduled phone callto caregivernext month  [facility] CM Care Plan Problem One     Most Recent Value  Care Plan Problem One  High risk for hospital readmission related to/ as evidenced by two recent hospitalizations for AMs/ hypoglycemia  Role Documenting the Problem One  Care Management [place] for Problem One  Not Active  [facility] Long Term Goal   Over the next 31 days, patient will not experience hospiatl readmission, as evidenced by caregiver reporting and review of EMR during [facility] RN CM outreach  [facility] Long Term Goal Start Date  01/14/19  [facility] Long Term Goal Met Date  02/11/19 [facility] met]  Interventions for Problem One Long Term Goal  Confirmed with caregiver that patient has not had hospital readmission within a month of his last hospital discharge,  discussed patient's clinical condition with caregiver and confirmed that she does not have current clinical concerns  [facility] CM Short Term Goal #1   Over the next 30 days, patient will actively participate in home health services as ordered post-hospital discharge, as evidenced by caregiver reporting and colaboration with home health team as indicated during [facility] RN CM outreach  [facility] CM Short Term Goal #1 Start Date  01/14/19  [facility] CM Short Term Goal #1 Met Date  02/11/19 [Goal met]  Interventions for Short Term Goal #1  Confirmed that patient has continued to actively participate in home health services post-hospital discharge  [facility] CM Short Term Goal #2   Over the next 30 days, patient will attend all scheduled provider appointments,  as evidenced by caregiver reporting and collaboration as indicated with care providers during [facility] RN CM outreach  [facility] CM Short Term Goal #2 Start Date  01/14/19  [facility] CM Short Term Goal #2 Met Date  02/11/19 [Goal Met]  Interventions for Short Term Goal #2  Confirmed that patient has attended all scheduled provider appointments post-hospital discharge and that spouse/ family continue to provide reliable transportation for patient    [facility] CM Care Plan Problem Two     Most Recent Value  Care Plan Problem Two  Self-health management of chronic disease state of DM, as evidenced by patient's caregiver reporting   Role Documenting the Problem Two  Care Management Coordinator  Care Plan for Problem Two  Active  Interventions for Problem Two Long Term Goal   Reviewed with caregiver patient's recent blood sugars and occasional episodes of hypoglycemia,  reviewed caregiver's established action plan for hypoglycemic episodes and reviewed current DM medication dosing,  confirmed that caregiver plans to transport patient to established endocrinology provider office visit next month and discussed status of pending new endocrinology provider referral  [facility] Long Term Goal  Over the next 60 days, patient/ caregiver will continue to monitor/ record patient's blood sugars 3-4 times a day, as evidenced by review of same and caregiver reporting during [facility] RN CM outreach  [facility] Long Term Goal Start Date  02/11/19     Oneta Rack, RN, BSN, [place] Coordinator [facility] Care Management  [phone]

## 2019-02-12 DIAGNOSIS — E11649 Type 2 diabetes mellitus with hypoglycemia without coma: Secondary | ICD-10-CM | POA: Diagnosis not present

## 2019-02-12 DIAGNOSIS — E11621 Type 2 diabetes mellitus with foot ulcer: Secondary | ICD-10-CM | POA: Diagnosis not present

## 2019-02-12 DIAGNOSIS — I5032 Chronic diastolic (congestive) heart failure: Secondary | ICD-10-CM | POA: Diagnosis not present

## 2019-02-12 DIAGNOSIS — L97511 Non-pressure chronic ulcer of other part of right foot limited to breakdown of skin: Secondary | ICD-10-CM | POA: Diagnosis not present

## 2019-02-12 DIAGNOSIS — I130 Hypertensive heart and chronic kidney disease with heart failure and stage 1 through stage 4 chronic kidney disease, or unspecified chronic kidney disease: Secondary | ICD-10-CM | POA: Diagnosis not present

## 2019-02-12 DIAGNOSIS — E1159 Type 2 diabetes mellitus with other circulatory complications: Secondary | ICD-10-CM | POA: Diagnosis not present

## 2019-02-14 ENCOUNTER — Telehealth: Payer: Self-pay | Admitting: Family Medicine

## 2019-02-14 NOTE — Telephone Encounter (Signed)
See below

## 2019-02-14 NOTE — Telephone Encounter (Signed)
David [name] with [facility] calling to check the status of getting some verbal orders. States that she has faxed orders to the office multiple times (01/15/2019, 01/18/2019, 01/31/2019 and then again 02/14/2019). States that the orders requested were as follows: 01/15/2019 - resumption of care for home health 01/18/2019 - Occupation therapy add on  Please advise.  CB#: [phone] Fax#: [phone]

## 2019-02-18 ENCOUNTER — Ambulatory Visit (INDEPENDENT_AMBULATORY_CARE_PROVIDER_SITE_OTHER): Payer: Medicare Other | Admitting: Family Medicine

## 2019-02-18 ENCOUNTER — Other Ambulatory Visit: Payer: Self-pay

## 2019-02-18 DIAGNOSIS — Z794 Long term (current) use of insulin: Secondary | ICD-10-CM

## 2019-02-18 DIAGNOSIS — R197 Diarrhea, unspecified: Secondary | ICD-10-CM | POA: Diagnosis not present

## 2019-02-18 DIAGNOSIS — E1165 Type 2 diabetes mellitus with hyperglycemia: Secondary | ICD-10-CM

## 2019-02-18 MED ORDER — GLUCOSE BLOOD VI STRP: ORAL_STRIP | 2 refills | Status: DC

## 2019-02-18 MED ORDER — CHOLESTYRAMINE LIGHT 4 G PO PACK: 4.0000 g | PACK | Freq: Two times a day (BID) | ORAL | 5 refills | Status: DC

## 2019-02-18 NOTE — Progress Notes (Signed)
    Chief Complaint:  David [name] is a 78 y.o. male who presents today for a virtual office visit with a chief complaint of diarrhea.   Assessment/Plan:  Diarrhea No red flags.  Likely due to sequela of cholecystectomy.  Will start cholestyramine.  Discussed reasons return to care.     Subjective:  HPI:  Diarrhea  Started about a month ago. Comes and goes.No specific triggers. They are trying to be careful with diet. No hematochezia. No fevers or chills. No melena. No nausea or vomting.  No specific dietary triggers.  No treatments tried.  No sick contacts.  No melena.  No other obvious alleviating or aggravating factors. ROS: Per HPI  PMH: He reports that he quit smoking about 34 years ago. He has a 60.00 pack-year smoking history. He has never used smokeless tobacco. He reports current alcohol use of about 1.0 standard drinks of alcohol per week. He reports that he does not use drugs.      Objective/Observations  Physical Exam: Gen: NAD, resting comfortably Pulm: Normal work of breathing Neuro: Grossly normal, moves all extremities Psych: Normal affect and thought content  No results found for this or any previous visit (from the past 24 hour(s)).   Virtual Visit via Video   I connected with David [name] on 02/18/19 at 11:00 AM EDT by a video enabled telemedicine application and verified that I am speaking with the correct person using two identifiers. I discussed the limitations of evaluation and management by telemedicine and the availability of in person appointments. The patient expressed understanding and agreed to proceed.   Patient location: Home Provider location: [place] participating in the virtual visit: Myself and Patient     [name], MD 02/18/2019 11:12 AM

## 2019-02-18 NOTE — Assessment & Plan Note (Signed)
No red flags.  Likely due to sequela of cholecystectomy.  Will start cholestyramine.  Discussed reasons return to care.

## 2019-02-18 NOTE — Telephone Encounter (Signed)
Spoke with Mariann Laster she is faxing over forms

## 2019-02-19 DIAGNOSIS — I5032 Chronic diastolic (congestive) heart failure: Secondary | ICD-10-CM | POA: Diagnosis not present

## 2019-02-19 DIAGNOSIS — I130 Hypertensive heart and chronic kidney disease with heart failure and stage 1 through stage 4 chronic kidney disease, or unspecified chronic kidney disease: Secondary | ICD-10-CM | POA: Diagnosis not present

## 2019-02-19 DIAGNOSIS — E11649 Type 2 diabetes mellitus with hypoglycemia without coma: Secondary | ICD-10-CM | POA: Diagnosis not present

## 2019-02-19 DIAGNOSIS — E11621 Type 2 diabetes mellitus with foot ulcer: Secondary | ICD-10-CM | POA: Diagnosis not present

## 2019-02-19 DIAGNOSIS — L97511 Non-pressure chronic ulcer of other part of right foot limited to breakdown of skin: Secondary | ICD-10-CM | POA: Diagnosis not present

## 2019-02-19 DIAGNOSIS — E1159 Type 2 diabetes mellitus with other circulatory complications: Secondary | ICD-10-CM | POA: Diagnosis not present

## 2019-02-26 ENCOUNTER — Other Ambulatory Visit: Payer: Self-pay

## 2019-02-26 ENCOUNTER — Ambulatory Visit (INDEPENDENT_AMBULATORY_CARE_PROVIDER_SITE_OTHER): Payer: Medicare Other

## 2019-02-26 ENCOUNTER — Telehealth: Payer: Self-pay

## 2019-02-26 ENCOUNTER — Other Ambulatory Visit: Payer: Self-pay | Admitting: Endocrinology

## 2019-02-26 ENCOUNTER — Ambulatory Visit (INDEPENDENT_AMBULATORY_CARE_PROVIDER_SITE_OTHER): Payer: Medicare Other | Admitting: Podiatry

## 2019-02-26 DIAGNOSIS — E11621 Type 2 diabetes mellitus with foot ulcer: Secondary | ICD-10-CM

## 2019-02-26 DIAGNOSIS — L97521 Non-pressure chronic ulcer of other part of left foot limited to breakdown of skin: Secondary | ICD-10-CM

## 2019-02-26 DIAGNOSIS — L89612 Pressure ulcer of right heel, stage 2: Secondary | ICD-10-CM | POA: Diagnosis not present

## 2019-02-26 DIAGNOSIS — L97511 Non-pressure chronic ulcer of other part of right foot limited to breakdown of skin: Secondary | ICD-10-CM | POA: Diagnosis not present

## 2019-02-26 DIAGNOSIS — L97411 Non-pressure chronic ulcer of right heel and midfoot limited to breakdown of skin: Secondary | ICD-10-CM | POA: Diagnosis not present

## 2019-02-26 DIAGNOSIS — E1159 Type 2 diabetes mellitus with other circulatory complications: Secondary | ICD-10-CM | POA: Diagnosis not present

## 2019-02-26 DIAGNOSIS — L97519 Non-pressure chronic ulcer of other part of right foot with unspecified severity: Secondary | ICD-10-CM

## 2019-02-26 DIAGNOSIS — E119 Type 2 diabetes mellitus without complications: Secondary | ICD-10-CM

## 2019-02-26 DIAGNOSIS — S91111A Laceration without foreign body of right great toe without damage to nail, initial encounter: Secondary | ICD-10-CM

## 2019-02-26 DIAGNOSIS — E11649 Type 2 diabetes mellitus with hypoglycemia without coma: Secondary | ICD-10-CM | POA: Diagnosis not present

## 2019-02-26 DIAGNOSIS — I5032 Chronic diastolic (congestive) heart failure: Secondary | ICD-10-CM | POA: Diagnosis not present

## 2019-02-26 DIAGNOSIS — I130 Hypertensive heart and chronic kidney disease with heart failure and stage 1 through stage 4 chronic kidney disease, or unspecified chronic kidney disease: Secondary | ICD-10-CM | POA: Diagnosis not present

## 2019-02-26 MED ORDER — MUPIROCIN 2 % EX OINT: TOPICAL_OINTMENT | CUTANEOUS | 0 refills | Status: AC

## 2019-02-26 MED ORDER — NOVOLIN 70/30 FLEXPEN RELION (70-30) 100 UNIT/ML ~~LOC~~ SUPN: 30.0000 [IU] | PEN_INJECTOR | Freq: Two times a day (BID) | SUBCUTANEOUS | 2 refills | Status: DC

## 2019-02-26 NOTE — Patient Instructions (Addendum)
DRESSING CHANGES RIGHTFOOT:  WEAR SURGICAL SHOE AT ALL TIMES    1. KEEP RIGHT  FOOT DRY AT ALL TIMES!!!!  2. CLEANSE ULCER WITH SALINE.  3. DAB DRY WITH GAUZE SPONGE.  4. APPLY A LIGHT AMOUNT OF MUPIROCIN TO BASE OF ULCER.  5. APPLY OUTER DRESSING/BAND-AID AS INSTRUCTED.  6. WEAR SURGICAL SHOE DAILY AT ALL TIMES.  7. DO NOT WALK BAREFOOT!!!  8.  IF YOU EXPERIENCE ANY FEVER, CHILLS, NIGHTSWEATS, NAUSEA OR VOMITING, ELEVATED OR LOW BLOOD SUGARS, REPORT TO EMERGENCY ROOM.  9. IF YOU EXPERIENCE INCREASED REDNESS, PAIN, SWELLING, DISCOLORATION, ODOR, PUS, DRAINAGE OR WARMTH OF YOUR FOOT, REPORT TO EMERGENCY ROOM.

## 2019-02-26 NOTE — Telephone Encounter (Signed)
Pt does need bloodwork. Please call and schedule this.

## 2019-02-26 NOTE — Telephone Encounter (Signed)
Called pt's wife and left voicemail requesting a call back to get clarification.

## 2019-02-26 NOTE — Telephone Encounter (Signed)
Patient called in wanting to know if labs needed to be done as well as wanted to get a refill on his insulin   Please advise

## 2019-02-26 NOTE — Telephone Encounter (Signed)
Rx has been sent  

## 2019-02-26 NOTE — Telephone Encounter (Signed)
Patients mom called back in asking if the Rx can be 40 units instead of the regular that you sent in. Stated that's how she got it under control

## 2019-02-27 ENCOUNTER — Telehealth: Payer: Self-pay | Admitting: Physical Therapy

## 2019-02-27 ENCOUNTER — Telehealth: Payer: Self-pay | Admitting: *Deleted

## 2019-02-27 ENCOUNTER — Other Ambulatory Visit: Payer: Self-pay

## 2019-02-27 ENCOUNTER — Encounter: Payer: Self-pay | Admitting: Podiatry

## 2019-02-27 MED ORDER — NOVOLOG MIX 70/30 FLEXPEN (70-30) 100 UNIT/ML ~~LOC~~ SUPN: PEN_INJECTOR | SUBCUTANEOUS | 2 refills | Status: DC

## 2019-02-27 NOTE — Telephone Encounter (Signed)
Copied from [place] [phone]. Topic: General - Other >> Feb 27, 2019  2:26 PM Leward Quan A wrote: Reason for CRM: Mariann Laster with [facility] called to inquire about orders that were faxed over for Dr [name] to sign and return faxed on 02/22/2019 and again today 02/27/2019. These orders are now out of Medicare compliance and they are needed back ASAP. Please  call Mariann Laster at Ph# [phone]

## 2019-02-27 NOTE — Telephone Encounter (Signed)
Faxed letter to [facility] with Dr. Heber Susank updated orders for wound care.

## 2019-02-27 NOTE — Progress Notes (Signed)
Subjective:   David [name] is accompanied by his wife on today and presents to clinic with cc of new wound formation to right foot. Pt's concern is for heel blister/pain right posterior heel. Present for the past week and is tender. Home Health Nurse has provided cushion dressing for his heel.   Wife states David [name] will be seeing Neurology for work up of vascular dementia. His memory loss appears to be worsening.   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 Wife states he fell on his knee on the front porch and digit scraped on cement.     Third concern is for right 3rd digit. Unsure of injury, but wife has been applying Mupirocin ointment digit daily and states it is looking better. No drainage, no swelling, no odor.    Patient/wife deny any fever, chills, nightsweats, nausea or vomiting.  Current Outpatient Medications on File Prior to Visit  Medication Sig Dispense Refill  . acetaminophen (TYLENOL) 325 MG tablet Take 1-2 tablets (325-650 mg total) by mouth every 4 (four) hours as needed for mild pain.    Marland Kitchen amLODipine (NORVASC) 5 MG tablet Take 1 tablet (5 mg total) by mouth daily. 90 tablet 0  . atorvastatin (LIPITOR) 10 MG tablet TAKE 1 TABLET BY MOUTH ONCE DAILY AT  6PM 90 tablet 0  . blood glucose meter kit and supplies KIT Dispense based on patient and insurance preference. Use up to four times daily as directed. (FOR ICD-9 250.00, 250.01). 1 each 0  . calcitRIOL (ROCALTROL) 0.25 MCG capsule Take 1 capsule by mouth once daily 90 capsule 0  . carvedilol (COREG) 25 MG tablet TAKE 1 TABLET BY MOUTH TWICE DAILY WITH A MEAL 180 tablet 0  . cholecalciferol (VITAMIN D) 1000 units tablet Take 1 tablet (1,000 Units total) by mouth daily. 30 tablet 1  . cholestyramine light (PREVALITE) 4 g packet Take 1 packet (4 g total) by mouth 2 (two) times daily. 60 each 5  . clopidogrel (PLAVIX) 75 MG tablet Take 1 tablet by mouth once daily 90 tablet 0  . diclofenac sodium (VOLTAREN)  1 % GEL Apply 2 g topically 4 (four) times daily. 150 g 0  . glucose blood test strip Use Contour test strips as instructed to check blood sugar four times daily. 200 each 2  . hydrALAZINE (APRESOLINE) 100 MG tablet Take 100 mg by mouth 3 (three) times daily. Take 1 tablet by mouth three times daily.    . Insulin Syringe-Needle U-100 (INSULIN SYRINGE 1CC/31GX5/16") 31G X 5/16" 1 ML MISC 1 each by Does not apply route 3 (three) times daily. Use insulin syringe to inject insulin three times daily. 100 each 2  . isosorbide mononitrate (IMDUR) 30 MG 24 hr tablet Take 2 tablets by mouth once daily 180 tablet 3  . liraglutide (VICTOZA) 18 MG/3ML SOPN Inject 0.3 mLs (1.8 mg total) into the skin daily before supper. 9 pen 1  . NOVOLOG MIX 70/30 FLEXPEN (70-30) 100 UNIT/ML FlexPen INJECT 30 UNITS SUBCUTANEOUSLY TWICE DAILY BEFORE A MEAL    . pantoprazole (PROTONIX) 40 MG tablet Take 1 tablet (40 mg total) by mouth daily. 90 tablet 1  . sertraline (ZOLOFT) 100 MG tablet Take 100 mg by mouth daily.    Marland Kitchen torsemide (DEMADEX) 20 MG tablet Take 2 tablets (63m) by mouth every OTHER day. 180 tablet 3  . vitamin B-12 (CYANOCOBALAMIN) 100 MCG tablet Take 100 mcg by mouth daily.     No current facility-administered  medications on file prior to visit.      Allergies  Allergen Reactions  . Ativan [Lorazepam] Anxiety    Pt gets more agitated  . Adhesive [Tape] Other (See Comments)    blisters     Objective:   Vascular Examination: Capillary refill time <4 seconds x 7 digits.  Dorsalis pedis pulses faintly palpable b/l.  Posterior tibial pulses nonpalpable b/l.  Digital hair absent b/l.  Skin temperature gradient WNL b/l.  Dermatological Examination: Skin thin, shiny and atrophic b/l.  Toenails 1-5 b/l elongated, discolored, thick, dystrophic with subungual debris and pain with palpation to nailbeds due to thickness of nails.  Ulceration #1 located right posterior heel: Pressure ulcer posterior  heel with broken blister roof. No erythema, no edema, no drainage. +Tenderness to palpation. Stage 2. No odor, no purulence, no periulcerative erythema, no periulcerative edema, no tracking/tunneling into deep tissues.  Wound #1: Dorsal hallux stump right foot Transverse laceration from reported trauma in healing stage. No purulence, no erythema, no edema, no drainage, no odor.  Wound #2: Right 3rd digit Healing. No purulence, no erythema, no flocculence, no drainage, no odor.  Ulceration #2: Distal tip left 3rd digit Predebridement 0.5 cm in diameter with subdermal hemorrhage. No periulcerative erythema, no edema. +hemmorhagic fluid less than 1/4 cc. Postdebridement, measures 0.4 x 0.3 x 0.1 cm.  Musculoskeletal: Muscle strength 5/5 to all LE muscle groups bilaterally.  Neurological: Sensation absent b/l with10 gram monofilament.  Vibratory sensation absent b/l.  Xrays: Right foot: No evidence of gas in tissues. No bone erosion calcaneus. No bone erosion distal hallux stump or right 3rd digit.  Assessment:   1.  Stage 2 Pressure Ulceration right heel in diabetic patient 2.  Diabetic Ulceration distal tip left 3rd digit 3.  Laceration distal hallux stump in diabetic patient 4. Wound right 3rd digit in diabetic patient 5. NIDDM with peripheral neuropathy  Plan: 1. We will [facility] Care: Ulcer left 3rd digit debrided to the level of healthy, viable tissue. Ulcer was cleansed with wound cleanser. Triple antibiotic ointment was applied to base of wound with light dressing. Apply Mupirocin Ointment once daily until healed.  Continue Mupirocin Ointment to right 3rd digit daily until healed. Cleanse right heel with saline. Apply Mupirocin Ointment and padded dressing.  2. Pressure ulcer right heel was cleansed and light dry dressing applied. Discussed need for strict pressure precautions. Will need dressing supplies. Will send order to [facility] for dressing change  supplies. Dispensed a pair of heel protectors to wear while he is in bed. Wife also instructed to pay attention to his back and buttocks and report any changes to his PCP/Home Health Nurse. 3. Xrays both feet taken/reviewed with David [name]. 4. Surgical shoe was dispensed for right foot. 5. Patient's wife was given instructions on offloading and dressing change/aftercare and was instructed to call immediately if any signs or symptoms of infection arise. 6. Prescription written for Mupirocin Ointment. Patient is to apply to wounds as above once daily and cover with dressing.   7. Patient is to follow up 1 week with Dr. Posey Pronto. 8. Patient instructed to report to emergency department with worsening appearance of ulcer/toe/foot, increased pain, foul odor, increased redness, swelling, drainage, fever, chills, nightsweats, nausea, vomiting, increased blood sugar.  9. Patient/POA related understanding.

## 2019-02-27 NOTE — Telephone Encounter (Signed)
-----   Message from Marzetta Board, DPM sent at 02/27/2019  7:58 AM EDT ----- Dr. Posey Pronto, [facility] [place]. Scheduled for 1 week follow up with you.Good morning Val!I will walk [facility] dressing order over to you. He has 4 wounds.Please send orders to [facility] for Wound Care: Has 4 Wounds1.  Ulcer left 3rd digit: Cleanse with wound cleanser. Apply Mupirocin Ointment once daily2.  Right posterior heel ulcer: Cleanse with wound cleanser and apply Mupirocin Ointment and padded dressing daily. Use heel protectors at all times when in bed.3.  Right 3rd digit: Cleanse digit with wound cleanser. Apply Mupirocin Ointment daily until healed.  4.  Right hallux stump: Cleanse ulcer with wound cleanser. Apply Mupirocin Ointment daily with light dressing. Thanks!

## 2019-02-27 NOTE — Telephone Encounter (Signed)
Dr. Elisha Ponder ordered Silver Collagen, Silicone Bordered foam, adhesive remover, saline, gloves and cotton applicators for daily dressing changes to right heel measuring 2.0 x 2.0 x 0.1cm, left 3rd digit 0.4 x 0.3 x 0.1cm, from [facility]. Faxed orders to [facility]/

## 2019-02-28 ENCOUNTER — Other Ambulatory Visit: Payer: Self-pay

## 2019-02-28 MED ORDER — NOVOLOG MIX 70/30 FLEXPEN (70-30) 100 UNIT/ML ~~LOC~~ SUPN: PEN_INJECTOR | SUBCUTANEOUS | 2 refills | Status: DC

## 2019-03-01 ENCOUNTER — Other Ambulatory Visit (INDEPENDENT_AMBULATORY_CARE_PROVIDER_SITE_OTHER): Payer: Medicare Other

## 2019-03-01 ENCOUNTER — Other Ambulatory Visit: Payer: Self-pay

## 2019-03-01 DIAGNOSIS — E1165 Type 2 diabetes mellitus with hyperglycemia: Secondary | ICD-10-CM | POA: Diagnosis not present

## 2019-03-01 DIAGNOSIS — Z794 Long term (current) use of insulin: Secondary | ICD-10-CM | POA: Diagnosis not present

## 2019-03-01 DIAGNOSIS — E782 Mixed hyperlipidemia: Secondary | ICD-10-CM | POA: Diagnosis not present

## 2019-03-01 LAB — COMPREHENSIVE METABOLIC PANEL
ALT: 14 U/L (ref 0–53)
AST: 14 U/L (ref 0–37)
Albumin: 4.1 g/dL (ref 3.5–5.2)
Alkaline Phosphatase: 83 U/L (ref 39–117)
BUN: 53 mg/dL — ABNORMAL HIGH (ref 6–23)
CO2: 20 mEq/L (ref 19–32)
Calcium: 9.6 mg/dL (ref 8.4–10.5)
Chloride: 109 mEq/L (ref 96–112)
Creatinine, Ser: 2.23 mg/dL — ABNORMAL HIGH (ref 0.40–1.50)
GFR: 28.6 mL/min — ABNORMAL LOW (ref >60.00)
Glucose, Bld: 206 mg/dL — ABNORMAL HIGH (ref 70–99)
Potassium: 4.6 mEq/L (ref 3.5–5.1)
Sodium: 140 mEq/L (ref 135–145)
Total Bilirubin: 0.3 mg/dL (ref 0.2–1.2)
Total Protein: 7.1 g/dL (ref 6.0–8.3)

## 2019-03-01 LAB — LIPID PANEL
Cholesterol: 108 mg/dL (ref 0–200)
HDL: 22.1 mg/dL — ABNORMAL LOW (ref >39.00)
NonHDL: 86.22
Total CHOL/HDL Ratio: 5
Triglycerides: 255 mg/dL — ABNORMAL HIGH (ref 0.0–149.0)
VLDL: 51 mg/dL — ABNORMAL HIGH (ref 0.0–40.0)

## 2019-03-01 LAB — LDL CHOLESTEROL, DIRECT: Direct LDL: 37 mg/dL

## 2019-03-01 LAB — HEMOGLOBIN A1C: Hgb A1c MFr Bld: 8.3 % — ABNORMAL HIGH (ref 4.6–6.5)

## 2019-03-01 NOTE — Telephone Encounter (Signed)
Notified Mariann Laster forms received and will fax once completed.

## 2019-03-04 ENCOUNTER — Ambulatory Visit (INDEPENDENT_AMBULATORY_CARE_PROVIDER_SITE_OTHER): Payer: Medicare Other | Admitting: *Deleted

## 2019-03-04 ENCOUNTER — Other Ambulatory Visit: Payer: Self-pay

## 2019-03-04 DIAGNOSIS — I495 Sick sinus syndrome: Secondary | ICD-10-CM

## 2019-03-04 DIAGNOSIS — I421 Obstructive hypertrophic cardiomyopathy: Secondary | ICD-10-CM | POA: Diagnosis not present

## 2019-03-05 ENCOUNTER — Encounter: Payer: Self-pay | Admitting: Endocrinology

## 2019-03-05 ENCOUNTER — Ambulatory Visit (INDEPENDENT_AMBULATORY_CARE_PROVIDER_SITE_OTHER): Payer: Medicare Other | Admitting: Endocrinology

## 2019-03-05 ENCOUNTER — Ambulatory Visit (INDEPENDENT_AMBULATORY_CARE_PROVIDER_SITE_OTHER): Payer: Medicare Other | Admitting: Podiatry

## 2019-03-05 ENCOUNTER — Other Ambulatory Visit: Payer: Self-pay

## 2019-03-05 VITALS — BP 124/42 | HR 76 | Ht 76.0 in | Wt 277.8 lb

## 2019-03-05 DIAGNOSIS — Z23 Encounter for immunization: Secondary | ICD-10-CM | POA: Diagnosis not present

## 2019-03-05 DIAGNOSIS — E1165 Type 2 diabetes mellitus with hyperglycemia: Secondary | ICD-10-CM

## 2019-03-05 DIAGNOSIS — L97519 Non-pressure chronic ulcer of other part of right foot with unspecified severity: Secondary | ICD-10-CM | POA: Diagnosis not present

## 2019-03-05 DIAGNOSIS — E1151 Type 2 diabetes mellitus with diabetic peripheral angiopathy without gangrene: Secondary | ICD-10-CM | POA: Diagnosis not present

## 2019-03-05 DIAGNOSIS — E11621 Type 2 diabetes mellitus with foot ulcer: Secondary | ICD-10-CM

## 2019-03-05 DIAGNOSIS — I639 Cerebral infarction, unspecified: Secondary | ICD-10-CM

## 2019-03-05 DIAGNOSIS — Z794 Long term (current) use of insulin: Secondary | ICD-10-CM | POA: Diagnosis not present

## 2019-03-05 DIAGNOSIS — L89612 Pressure ulcer of right heel, stage 2: Secondary | ICD-10-CM | POA: Diagnosis not present

## 2019-03-05 DIAGNOSIS — E782 Mixed hyperlipidemia: Secondary | ICD-10-CM | POA: Diagnosis not present

## 2019-03-05 LAB — MICROALBUMIN / CREATININE URINE RATIO
Creatinine,U: 71.5 mg/dL
Microalb Creat Ratio: 10.2 mg/g (ref 0.0–30.0)
Microalb, Ur: 7.3 mg/dL — ABNORMAL HIGH (ref 0.0–1.9)

## 2019-03-05 NOTE — Addendum Note (Signed)
Addended by: Kaylyn Lim I on: 03/05/2019 03:26 PM   Modules accepted: Orders

## 2019-03-05 NOTE — Progress Notes (Signed)
Patient ID: David [name], male   DOB: 06/21/1940, 78 y.o.   MRN: [id number]    Reason for Appointment: Followup for Type 2 Diabetes   History of Present Illness:          Diagnosis: Type 2 diabetes mellitus, date of diagnosis:   1992       Past history:  He was initially treated with metformin and at some point also glipizide. Apparently he was started on insulin in 1994 approximately because of poor control He has been on various insulin regimens over the last several years However even with insulin he has had poor control for at least the last 7 or 8 years. He does not know what his previous A1c levels have been. He had been continued on metformin and glipizide but metformin stopped because of kidney function abnormality He had been taking Lantus 60 units twice a day with NovoLog previously and also Before his initial consultation in 6/15 he was on NovoLog twice a day and Humalog mix insulin  Because of poor control and large insulin doses he was started on Victoza in 6/15  Since 04/28/14 he had been on a [employer] insulin pump because of persistent poor control and high insulin requirement  Recent history:     Non-insulin hypoglycemic drugs the patient is taking are: VICTOZA 1.50m daily  Has had A1c's of mostly over 8%, now 8.3 %    Current management, problems and blood sugar patterns:  He has had progressive difficulties with memory function and has stopped using the insulin pump  Also because of hypoglycemia in August he was switched to a simpler regimen of premixed insulin twice a day  His wife is drawing up insulin for him  His blood sugars have been quite variable  He thinks that until last Friday he was eating poorly because of having family members visit him at his home  Most of his blood sugars are being done before meals only but these are generally higher especially during the day including on his lab work  Appears to have relatively low  readings after dinner when checked  However fasting readings have been variable  No hypoglycemia  Not doing any significant exercise Average blood sugar recently nearly 200    Currently using Contour meter Not clear which readings are before or after eating  PRE-MEAL Fasting Lunch Dinner Bedtime Overall  Glucose range:  89-256   112-286    Mean/median:      197   POST-MEAL PC Breakfast PC Lunch PC Dinner  Glucose range:    71-186  Mean/median:        Self-care:   Meals: 2- 3 meals per day.   breakfast is cheerios or egg and toast.  Will have half sandwich with soup at 1 pm , dinner at  6-7 pm.     Bedtime snack is usually crackers  with milk or have been admitted sandwich         Exercise:  Walking occasionally and exercising including on elliptical for up to 60 minutes, twice a week at the gym  Last  consultation : Most recent: 08/2017      Wt Readings from Last 3 Encounters:  03/05/19 277 lb 12.8 oz (126 kg)  01/15/19 290 lb (131.5 kg)  01/11/19 291 lb 0.1 oz (132 kg)   Glycemic control:   Lab Results  Component Value Date   HGBA1C 8.3 (H) 03/01/2019   HGBA1C 7.4 (H) 01/06/2019  HGBA1C 9.0 (H) 10/23/2018   Lab Results  Component Value Date   MICROALBUR 11.9 (H) 10/24/2017   LDLCALC 73 01/06/2019   CREATININE 2.23 (H) 03/01/2019            Lab Results  Component Value Date   FRUCTOSAMINE 295 (H) 12/28/2018   FRUCTOSAMINE 316 (H) 07/17/2018   FRUCTOSAMINE 319 (H) 12/27/2017   FRUCTOSAMINE 280 10/24/2017     Other active problems: See review of systems   Allergies as of 03/05/2019      Reactions   Ativan [lorazepam] Anxiety   Pt gets more agitated   Adhesive [tape] Other (See Comments)   blisters      Medication List       Accurate as of March 05, 2019  2:09 PM. If you have any questions, ask your nurse or doctor.        STOP taking these medications   NovoLIN 70/30 FlexPen Relion (70-30) 100 UNIT/ML PEN Generic drug: Insulin Isophane &  Regular Human Stopped by: Elayne Snare, MD     TAKE these medications   acetaminophen 325 MG tablet Commonly known as: TYLENOL Take 1-2 tablets (325-650 mg total) by mouth every 4 (four) hours as needed for mild pain.   amLODipine 5 MG tablet Commonly known as: NORVASC Take 1 tablet (5 mg total) by mouth daily.   atorvastatin 10 MG tablet Commonly known as: LIPITOR TAKE 1 TABLET BY MOUTH ONCE DAILY AT  6PM   blood glucose meter kit and supplies Kit Dispense based on patient and insurance preference. Use up to four times daily as directed. (FOR ICD-9 250.00, 250.01).   calcitRIOL 0.25 MCG capsule Commonly known as: ROCALTROL Take 1 capsule by mouth once daily   carvedilol 25 MG tablet Commonly known as: COREG TAKE 1 TABLET BY MOUTH TWICE DAILY WITH A MEAL   cholecalciferol 1000 units tablet Commonly known as: VITAMIN D Take 1 tablet (1,000 Units total) by mouth daily.   cholestyramine light 4 g packet Commonly known as: Prevalite Take 1 packet (4 g total) by mouth 2 (two) times daily.   clopidogrel 75 MG tablet Commonly known as: PLAVIX Take 1 tablet by mouth once daily   diclofenac sodium 1 % Gel Commonly known as: VOLTAREN Apply 2 g topically 4 (four) times daily.   glucose blood test strip Use Contour test strips as instructed to check blood sugar four times daily.   hydrALAZINE 100 MG tablet Commonly known as: APRESOLINE Take 100 mg by mouth 3 (three) times daily. Take 1 tablet by mouth three times daily.   INSULIN SYRINGE 1CC/31GX5/16" 31G X 5/16" 1 ML Misc 1 each by Does not apply route 3 (three) times daily. Use insulin syringe to inject insulin three times daily.   isosorbide mononitrate 30 MG 24 hr tablet Commonly known as: IMDUR Take 2 tablets by mouth once daily   mupirocin ointment 2 % Commonly known as: BACTROBAN Apply to right heel once daily   NovoLOG Mix 70/30 FlexPen (70-30) 100 UNIT/ML FlexPen Generic drug: insulin aspart protamine -  aspart Inject 40 units under the skin twice daily. DX:E11.65   pantoprazole 40 MG tablet Commonly known as: PROTONIX Take 1 tablet (40 mg total) by mouth daily.   sertraline 100 MG tablet Commonly known as: ZOLOFT Take 100 mg by mouth daily.   torsemide 20 MG tablet Commonly known as: DEMADEX Take 2 tablets (36m) by mouth every OTHER day.   Victoza 18 MG/3ML Sopn Generic drug: liraglutide Inject  0.3 mLs (1.8 mg total) into the skin daily before supper.   vitamin B-12 100 MCG tablet Commonly known as: CYANOCOBALAMIN Take 100 mcg by mouth daily.       Allergies:  Allergies  Allergen Reactions  . Ativan [Lorazepam] Anxiety    Pt gets more agitated  . Adhesive [Tape] Other (See Comments)    blisters    Past Medical History:  Diagnosis Date  . Acute cholecystitis s/p lap cholecystectomy 03/05/2017 03/04/2017  . Anemia, iron deficiency   . Anxiety   . Arthritis   . Atrial fibrillation with rapid ventricular response ([place])   . BPH (benign prostatic hypertrophy)   . CAD (coronary artery disease)    Nonobstructive CAD per cath  . Cardiac pacemaker in situ   . CHF (congestive heart failure) ([place])   . Chronic ulcer of right foot ([place])   . CKD (chronic kidney disease) stage 4, GFR 15-29 ml/min (HCC) 09/26/2017  . 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 Coronary artery disease involving native coronary artery of native heart without angina pectoris   . Diastolic dysfunction   . Dyspnea   . 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 History of cellulitis    right great toe 10-25-2014  . History of skin cancer   . HOCM (hypertrophic obstructive cardiomyopathy) ([place])   . Hyperlipidemia associated with type 2 diabetes mellitus ([place]) 10/25/2013  . Hypertension   . Hypertension associated with diabetes ([place]) 10/25/2013  . IDDM (insulin dependent diabetes mellitus) - on insulin pump  03/04/2017  . Insulin dependent type 2 diabetes mellitus ([place]) 1991   followd by [name]--  has insulin pump  . Insulin pump in place   . OSA on CPAP   . 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 Peripheral neuropathy    severe  . Peripheral vascular disease ([place])    bilateral lower extremities  . PVD (peripheral vascular disease) ([employer])   . Rib fracture 07/24/2015  . Seasonal and perennial allergic rhinitis 10/25/2013  . Seborrheic keratosis 09/19/2016  . Secondary hyperparathyroidism of renal origin ([place])   . 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 Sinus node dysfunction (H[facility])   . Sleep apnea   . Syncope 07/24/2015    Past Surgical History:  Procedure Laterality Date  . AMPUTATION OF REPLICATED TOES  Mar 8119   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)  . AMPUTATION TOE Right 03/12/2015   Procedure: RIGHT HALLUS AMPUTATION ;  Surgeon: [name], MD;  Location: [facility];  Service: Podiatry;  Laterality: Right;  . CARDIAC CATHETERIZATION  11-25-2010   [place], [place]   Nonobstructive CAD  . CARDIAC PACEMAKER PLACEMENT  Nov 2009   [employer]  . CHOLECYSTECTOMY N/A 03/05/2017   Procedure: LAPAROSCOPIC CHOLECYSTECTOMY WITH INTRAOPERATIVE CHOLANGIOGRAM;  Surgeon: [name], MD;  Location: [facility];  Service: General;  Laterality: N/A;  . EP IMPLANTABLE DEVICE N/A 06/03/2015   Procedure: PPM Generator Changeout;  Surgeon: [name], MD;  Location: [facility];  Service: Cardiovascular;  Laterality: N/A;  . EXCISION BONE CYST Right 03/06/2015   Procedure: BONE BIOPSIES OF RIGHT FOOT;  Surgeon: [name], MD;  Location: [facility];  Service: Podiatry;  Laterality: Right;  . ORIF ANKLE FRACTURE Left 11/06/2014   Procedure: OPEN REDUCTION INTERNAL FIXATION (ORIF) LEFT  ANKLE FRACTURE;  Surgeon: [name], MD;  Location: [facility];  Service: Orthopedics;  Laterality: Left;  . TEE WITHOUT CARDIOVERSION N/A 09/01/2017   Procedure: TRANSESOPHAGEAL ECHOCARDIOGRAM  (TEE);  Surgeon: [name], MD;  Location: [facility];  Service: Cardiovascular;  Laterality: N/A;  . TOTAL KNEE ARTHROPLASTY    . VEIN LIGATION AND STRIPPING      Family History  Problem Relation Age of Onset  . Cancer Mother        breast  . Heart attack Father   . Stroke Father     Social History:  reports that he quit smoking about 35 years ago. He has a 60.00 pack-year smoking history. He has never used smokeless tobacco. He reports current alcohol use of about 1.0 standard drinks of alcohol per week. He reports that he does not use drugs.    Review of Systems         Lipids: Has been on and off Lipitor since late 2014. Also had taken Gembrozil for several years without apparent side effects.  LDL is below 70 Previously had high ALT with Lipitor  HDL is consistently low  He is taking  fish oil OTC Triglycerides are higher but this may be related to taking cholestyramine now   Lab Results  Component Value Date   CHOL 108 03/01/2019   CHOL 138 01/06/2019   CHOL 122 07/17/2018   Lab Results  Component Value Date   HDL 22.10 (L) 03/01/2019   HDL 23 (L) 01/06/2019   HDL 25.80 (L) 07/17/2018   Lab Results  Component Value Date   LDLCALC 73 01/06/2019   LDLCALC 62 07/17/2018   LDLCALC 106 (H) 08/31/2017   Lab Results  Component Value Date   TRIG 255.0 (H) 03/01/2019   TRIG 210 (H) 01/06/2019   TRIG 168.0 (H) 07/17/2018   Lab Results  Component Value Date   CHOLHDL 5 03/01/2019   CHOLHDL 6.0 01/06/2019   CHOLHDL 5 07/17/2018   Lab Results  Component Value Date   LDLDIRECT 37.0 03/01/2019   LDLDIRECT 50.0 12/27/2017   LDLDIRECT 76.0 07/21/2017    Lab Results  Component Value Date   ALT 14 03/01/2019       HYPERTENSION: The blood pressure has been treated with various drugs including carvedilol 25 mg by his other physicians.    BP Readings from Last 3 Encounters:  03/05/19 (!) 124/42  01/15/19 (!) 176/67  01/12/19 (!) 160/53      Chronic kidney disease: Not from diabetes, is followed by nephrologist  Creatinine ranges:  Lab Results  Component Value Date   CREATININE 2.23 (H) 03/01/2019   CREATININE 1.89 (H) 01/12/2019   CREATININE 2.25 (H) 01/11/2019         Has history of Numbness in his feet  since about 2013  He has been wearing diabetic shoes.  He has been followed by podiatrist He is  under the care of a podiatrist   Vitamin B12 deficiency: Well managed with monthly injections  Lab Results  Component Value Date   VITAMINB12 433 01/11/2019       Physical Examination:  BP (!) 124/42 (BP Location: Left Arm, Patient Position: Sitting, Cuff Size: Normal)   Pulse 76   Ht '6\' 4"'  (1.93 m)   Wt 277 lb 12.8 oz (126 kg)   SpO2 98%   BMI 33.81 kg/m     ASSESSMENT:  Diabetes type 2, uncontrolled with obesity See history of present illness for detailed discussion of current diabetes management, blood sugar patterns and problems identified   His basal insulin is  now NovoLog mix 70/30  A1c is 8.3  He has had some onset of memory loss and unable to use his pump and sensor Also his wife is helping him with his insulin now She wants to try and avoid multiple injections and wants to continue premixed insulin twice a day despite inadequate control However some of his hyperglycemia is related to poor diet in the last couple of weeks  LIPIDS: As above, currently apparently needs to take cholestyramine which may be increasing his triglycerides but also his diet has not been adequate  PLAN:   Since his blood sugars recently appear to be lower at times after dinner when checked will reduce his evening insulin by 4 units He also has fasting reading was only 89 today Most of his sugars are high during the day but he thinks his diet may be improving His wife is reluctant to increase his insulin much  New insulin dose will be 42 units in the morning and 36 at dinnertime Reminded him to take this a few  minutes before starting eating at breakfast and dinnertime More readings after dinner to be checked Blood sugar targets after meals discussed Continue Victoza 1.8 He will need short-term follow-up in 6 weeks to reassess his management Advised him to try and cut back on sweets like donuts and have balanced lower carbohydrate meals with some protein May continue sugar-free Gatorade and Glucerna  Influenza vaccine given  Patient Instructions  Check blood sugars on waking up days a week  Also check blood sugars about 2 hours after meals and do this after different meals by rotation  Recommended blood sugar levels on waking up are 90-130 and about 2 hours after meal is 130-160  Please bring your blood sugar monitor to each visit, thank you  Insulin 42 units before Bfst and 36 before dinner    Counseling time on subjects discussed in assessment and plan sections is over 50% of today's 25 minute visit  Elayne Snare 03/05/2019, 2:09 PM   Note: This office note was prepared with Dragon voice recognition system technology. Any transcriptional errors that result from this process are unintentional.

## 2019-03-05 NOTE — Patient Instructions (Signed)
Check blood sugars on waking up days a week  Also check blood sugars about 2 hours after meals and do this after different meals by rotation  Recommended blood sugar levels on waking up are 90-130 and about 2 hours after meal is 130-160  Please bring your blood sugar monitor to each visit, thank you  Insulin 42 units before Bfst and 36 before dinner

## 2019-03-05 NOTE — Addendum Note (Signed)
Addended by: Elayne Snare on: 03/05/2019 03:25 PM   Modules accepted: Orders

## 2019-03-06 ENCOUNTER — Ambulatory Visit (INDEPENDENT_AMBULATORY_CARE_PROVIDER_SITE_OTHER): Payer: Medicare Other | Admitting: Neurology

## 2019-03-06 ENCOUNTER — Encounter: Payer: Self-pay | Admitting: Neurology

## 2019-03-06 ENCOUNTER — Other Ambulatory Visit: Payer: Self-pay

## 2019-03-06 VITALS — BP 141/60 | HR 68 | Temp 98.3°F | Wt 275.0 lb

## 2019-03-06 DIAGNOSIS — G3184 Mild cognitive impairment, so stated: Secondary | ICD-10-CM | POA: Diagnosis not present

## 2019-03-06 DIAGNOSIS — I639 Cerebral infarction, unspecified: Secondary | ICD-10-CM

## 2019-03-06 DIAGNOSIS — R413 Other amnesia: Secondary | ICD-10-CM

## 2019-03-06 MED ORDER — FISH OIL 1000 MG PO CAPS: 1.0000 | ORAL_CAPSULE | Freq: Every morning | ORAL | 1 refills | Status: AC

## 2019-03-06 NOTE — Patient Instructions (Signed)
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  I recommend he start taking fish oil 1000 mg daily as well as increase participation in cognitively challenging activities like solving crossword puzzles, playing bridge and sudoku.  Check memory panel labs and EEG to look for reversible causes.  We also discussed memory compensation strategies.  Continue Plavix for stroke prevention with strict control of hypertension with blood pressure goal below 130/90, lipids with LDL cholesterol goal below 70 mg percent and diabetes with hemoglobin A1c goal below 6.5%.  Is also encouraged to eat a healthy diet and to exercise regularly and lose weight.  He was advised to ambulate using a cane at all times and to avoid falls and injuries.  He will return for follow-up in 3 months or call earlier if necessary. Memory Compensation Strategies  1. Use "WARM" strategy.  W= write it down  A= associate it  R= repeat it  M= make a mental note  2.   You can keep a Social worker.  Use a 3-ring notebook with sections for the following: calendar, important names and phone numbers,  medications, doctors' names/phone numbers, lists/reminders, and a section to journal what you did  each day.   3.    Use a calendar to write appointments down.  4.    Write yourself a schedule for the day.  This can be placed on the calendar or in a separate section of the Memory Notebook.  Keeping a  regular schedule can help memory.  5.    Use medication organizer with sections for each day or morning/evening pills.  You may need help loading it  6.    Keep a basket, or pegboard by the door.  Place items that you need to take out with you in the basket or on the pegboard.  You may also want to  include a message board for reminders.  7.    Use sticky notes.  Place sticky notes with  reminders in a place where the task is performed.  For example: " turn off the  stove" placed by the stove, "lock the door" placed on the door at eye level, " take your medications" on  the bathroom mirror or by the place where you normally take your medications.  8.    Use alarms/timers.  Use while cooking to remind yourself to check on food or as a reminder to take your medicine, or as a  reminder to make a call, or as a reminder to perform another task, etc.

## 2019-03-06 NOTE — Progress Notes (Signed)
[facility] [address] [place]. [name] [address] [phone]       OFFICE FOLLOW UP NOTE  Mr. [name] Date of Birth:  [birth year] Medical Record Number:  [id number]   Reason for visit: Stroke follow up  CHIEF COMPLAINT:  Chief Complaint  Patient presents with   Referral    Referral for acute metabolic encephalopathy room 2 with wife karen and her temp is 97.2    HPI: David [name] is being seen today in the office for small areas of right frontal cortex posteriorly compatible with acute infarction on 08/30/17. History obtained from patient and chart review. Reviewed all radiology images and labs personally.  [name] is a 78 y.o. male with history of diabetes, hypertension, significant heart disease status post pacemaker placement who presented with L sided weakness x 2 days.  CT head reviewed showed no acute stroke.  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  Carotid Dopplers showed right ICA stenosis of 1 to 39% but study was limited in left carotid and ended due to patient's combativeness.  2D echo showed EF of 60 to 65%.  TEE was negative for thrombus and PFO.  Pacemaker interrogated which did not show evidence of A. fib.  LDL 6 as patient was previously on fish oil was recommended to start Lipitor 10 mg.  A1c 7.6 and recommended close PCP follow-up.  Recommended DAPT of aspirin and Plavix for 3 weeks followed by Plavix alone as patient was previously on aspirin 81 mg.  Therapy is recommended CIR for continued therapies.  10/31/2017 visit: Patient is being seen today for hospital follow-up and is accompanied by his wife.  Overall he continues to do well and continues to improve with PT/OT at [place] neuro rehab clinic [place].  He continues to take Plavix with mild bruising but no bleeding.  Continues to take Lipitor without side effects myalgias.  BP satisfactory  102/53.  He has returned to all previous activities except for driving as OT has stated that he is not quite there at multitasking at.  He states he feels a very mild weakness in his left side and does use a cane for stability but he did use this prior to the stroke as well.  Denies new or worsening stroke/TIA symptoms.  Interval history 03/05/2018: Patient returns today for follow up appointment with his wife. Wife states he has been making improvements daily. He continues to use cane on occasion but more for "security" reasons. Completed PT/OT since previous visit but remains active with his gym exercising. Continues to take plavix without bleeding but mild bruising. Continues lipitor without side effects of myalgias with recent LDL 50. Patient has returned to driving without complications along with all other activities except for golfing due to intermittent balance issues which he believes is from arthritis in his knees and decreased stamina. Blood pressure today 184/67, asymptomatic.  Rechecked manual at 178/68. Monitors at home intermittently as it was previously stable. Glucose levels have been stable as he does monitor at home.  No further concerns at this time.  Denies new or worsening stroke/TIA symptoms. Virtual video visit 09/04/2018 :  David [name] is a 78 y.o. malewith underlying history of diabetes, hypertension, significant heart disease status post pacemaker placement and recent diagnosis of right frontal cortex posteriorly compatible with acute infarction on 08/30/17 after presenting with left sided weakness. He was initially scheduled for office  follow up today but due to COVID-19 safety precautions, visit transitioned to telemedicine visit via WebEx. He has been doing well from a stroke standpoint with residual deficits of occasional balance difficulties. He will use a cane when out of the house.  No recent falls.   Continues on Plavix and atorvastatin without reported side effects.   Lipid panel 07/17/2018 LDL 62.  Blood pressure elevated at 184/84. Blood pressure has been elevated for the past 2 months per wife. He follows with nephrology with slowly increasing hydralazine with last increase approx 4 weeks prior.  Follow-up appointment with nephrology scheduled next week but rescheduled due to COVID-19 50 precautions.  Asymptomatic hypertension but does endorse increase fatigue over the past month.  Per wife, fatigue complaints extensively worked up by PCP which was unremarkable and felt fatigue likely related to medication use.  History of OSA and reported compliance with CPAP.  Glucose levels avg 160.  A1c 7.9 on 07/17/2018. He follows with endocrinology.  His wife endorses worsening short-term memory such as having a conversation or being told something and forgetting shortly after.  This is been present for approximately the past month.  No further concerns at this time.  Denies new or worsening stroke/TIA symptoms. Update 03/06/2019 : [name] for follow-up after last visit 6 months ago with Janett Billow nurse practitioner.  He was admitted to [facility] with confusion and disorientation in the setting of significantly elevated blood pressure on 01/05/2019.  MRI scan of the brain was obtained which showed no acute abnormality.  EEG showed mild generalized slowing.  Patient disorientation confusion gradual return back to baseline in 2 to 3 weeks with the wife has noticed that his being quite forgetful and not able to remember recent information.  His short-term memory is poor.  He has times in fact demanding names of his neighbors.  He finds this frustrating.  However remains quite independent at home and is managing all his activities of daily living.  He can be left alone for several hours.  He ambulates with a cane and his balance is poor.  Has had a few falls but no major injuries.  He does have family history of dementia in his dad also after a stroke and worries about it.  He  remains on Plavix which is tolerating well with minor bruising but no bleeding episodes.  His blood pressure is under good control today it is 141/60.  He states his sugars are also under good control.  Is tolerating Lipitor well without muscle aches and pains. ROS:   14 system review of systems performed and negative with exception of easy bruising and swelling in legs  PMH:  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[place])    CKD (chronic kidney disease) stage 4, GFR 15-29 ml/min ([place]) 09/26/2017   CKD (chronic kidney disease), 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   nephrologist-  [name]   Coronary artery disease involving native coronary artery of native heart without angina pectoris    Diastolic dysfunction    Dyspnea  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 ([place]) 09/04/2017   History of cellulitis    right great toe 10-25-2014   History of skin cancer    HOCM (hypertrophic obstructive cardiomyopathy) ([place])    Hyperlipidemia associated with type 2 diabetes mellitus ([place]) 10/25/2013   Hypertension    Hypertension associated with diabetes ([place]) 10/25/2013   IDDM (insulin dependent diabetes mellitus) - on insulin pump 03/04/2017   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   followd by [name]--  has insulin pump   Insulin pump in place    OSA on CPAP    Pacemaker 06/03/2015   Peripheral neuropathy    severe   Peripheral vascular disease (HCC)    bilateral lower extremities   PVD (peripheral vascular disease) ([place])    Rib fracture 07/24/2015    Seasonal and perennial allergic rhinitis 10/25/2013   Seborrheic keratosis 09/19/2016   Secondary hyperparathyroidism of renal origin ([place])    Sinus node arrhythmia 06/03/2015   Sinus node dysfunction ([place])    Sleep apnea    Syncope 07/24/2015    PSH:  Past Surgical History:  Procedure Laterality Date   AMPUTATION OF REPLICATED TOES  Mar 6754   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)   AMPUTATION TOE Right 03/12/2015   Procedure: RIGHT HALLUS AMPUTATION ;  Surgeon: [name], MD;  Location: [facility];  Service: Podiatry;  Laterality: Right;   CARDIAC CATHETERIZATION  11-25-2010   [place], [place]   Nonobstructive CAD   CARDIAC PACEMAKER PLACEMENT  Nov 2009   [employer]   CHOLECYSTECTOMY N/A 03/05/2017   Procedure: LAPAROSCOPIC CHOLECYSTECTOMY WITH INTRAOPERATIVE CHOLANGIOGRAM;  Surgeon: [name], MD;  Location: [facility];  Service: General;  Laterality: N/A;   EP IMPLANTABLE DEVICE N/A 06/03/2015   Procedure: PPM Generator Changeout;  Surgeon: [name], MD;  Location: [facility];  Service: Cardiovascular;  Laterality: N/A;   EXCISION BONE CYST Right 03/06/2015   Procedure: BONE BIOPSIES OF RIGHT FOOT;  Surgeon: [name], MD;  Location: [facility];  Service: Podiatry;  Laterality: Right;   ORIF ANKLE FRACTURE Left 11/06/2014   Procedure: OPEN REDUCTION INTERNAL FIXATION (ORIF) LEFT  ANKLE FRACTURE;  Surgeon: [name], MD;  Location: [facility];  Service: Orthopedics;  Laterality: Left;   TEE WITHOUT CARDIOVERSION N/A 09/01/2017   Procedure: TRANSESOPHAGEAL ECHOCARDIOGRAM (TEE);  Surgeon: [name], MD;  Location: [facility];  Service: Cardiovascular;  Laterality: N/A;   TOTAL KNEE ARTHROPLASTY     VEIN LIGATION AND STRIPPING      Social History:  Social History   Socioeconomic History   Marital status: Married    Spouse name: Not on file   Number of children: 3   Years of education: 12    Highest education level: Not on file  Occupational History   Occupation: Retired  Scientist, product/process development strain: Not hard at [employer] insecurity    Worry: Never true    Inability: Never true   Transportation needs    Medical: No    Non-medical: No  Tobacco Use   Smoking status: Former Smoker    Packs/day: 2.00    Years: 30.00    Pack years: 60.00    Quit date: 03/03/1984    Years since quitting: 35.0   Smokeless tobacco: Never Used  Substance and Sexual Activity   Alcohol use: Yes    Alcohol/week: 1.0 standard drinks  Types: 1 Glasses of wine per week    Comment: social   Drug use: No   Sexual activity: Not Currently  Lifestyle   Physical activity    Days per week: Not on file    Minutes per session: Not on file   Stress: Not on file  Relationships   Social connections    Talks on phone: Not on file    Gets together: Not on file    Attends religious service: Not on file    Active member of club or organization: Not on file    Attends meetings of clubs or organizations: Not on file    Relationship status: Not on file   Intimate partner violence    Fear of current or ex partner: Not on file    Emotionally abused: Not on file    Physically abused: Not on file    Forced sexual activity: Not on file  Other Topics Concern   Not on file  Social History Narrative   Currently resides with his wife. 1 dog. Fun/Hobby: Golf    Denies any religious beliefs effecting health care.     Family History:  Family History  Problem Relation Age of Onset   Cancer Mother        breast   Heart attack Father    Stroke Father     Medications:   Current Outpatient Medications on File Prior to Visit  Medication Sig Dispense Refill   acetaminophen (TYLENOL) 325 MG tablet Take 1-2 tablets (325-650 mg total) by mouth every 4 (four) hours as needed for mild pain.     amLODipine (NORVASC) 5 MG tablet Take 1 tablet (5 mg total) by mouth daily. 90 tablet  0   atorvastatin (LIPITOR) 10 MG tablet TAKE 1 TABLET BY MOUTH ONCE DAILY AT  6PM 90 tablet 0   blood glucose meter kit and supplies KIT Dispense based on patient and insurance preference. Use up to four times daily as directed. (FOR ICD-9 250.00, 250.01). 1 each 0   calcitRIOL (ROCALTROL) 0.25 MCG capsule Take 1 capsule by mouth once daily 90 capsule 0   carvedilol (COREG) 25 MG tablet TAKE 1 TABLET BY MOUTH TWICE DAILY WITH A MEAL 180 tablet 0   cholecalciferol (VITAMIN D) 1000 units tablet Take 1 tablet (1,000 Units total) by mouth daily. 30 tablet 1   cholestyramine light (PREVALITE) 4 g packet Take 1 packet (4 g total) by mouth 2 (two) times daily. 60 each 5   clopidogrel (PLAVIX) 75 MG tablet Take 1 tablet by mouth once daily 90 tablet 0   diclofenac sodium (VOLTAREN) 1 % GEL Apply 2 g topically 4 (four) times daily. 150 g 0   glucose blood test strip Use Contour test strips as instructed to check blood sugar four times daily. 200 each 2   hydrALAZINE (APRESOLINE) 100 MG tablet Take 100 mg by mouth 3 (three) times daily. Take 1 tablet by mouth three times daily.     Insulin Syringe-Needle U-100 (INSULIN SYRINGE 1CC/31GX5/16") 31G X 5/16" 1 ML MISC 1 each by Does not apply route 3 (three) times daily. Use insulin syringe to inject insulin three times daily. 100 each 2   isosorbide mononitrate (IMDUR) 30 MG 24 hr tablet Take 2 tablets by mouth once daily 180 tablet 3   liraglutide (VICTOZA) 18 MG/3ML SOPN Inject 0.3 mLs (1.8 mg total) into the skin daily before supper. 9 pen 1   mupirocin ointment (BACTROBAN) 2 % Apply to right  heel once daily 22 g 0   NOVOLOG MIX 70/30 FLEXPEN (70-30) 100 UNIT/ML FlexPen Inject 40 units under the skin twice daily. DX:E11.65 10 pen 2   pantoprazole (PROTONIX) 40 MG tablet Take 1 tablet (40 mg total) by mouth daily. 90 tablet 1   sertraline (ZOLOFT) 100 MG tablet Take 100 mg by mouth daily.     torsemide (DEMADEX) 20 MG tablet Take 2 tablets  (92m) by mouth every OTHER day. 180 tablet 3   vitamin B-12 (CYANOCOBALAMIN) 100 MCG tablet Take 100 mcg by mouth daily.     No current facility-administered medications on file prior to visit.     Allergies:   Allergies  Allergen Reactions   Ativan [Lorazepam] Anxiety    Pt gets more agitated   Adhesive [Tape] Other (See Comments)    blisters     Physical Exam  Vitals:   03/06/19 1357  BP: (!) 141/60  Pulse: 68  Temp: 98.3 F (36.8 C)  Weight: 124.7 kg   Body mass index is 33.47 kg/m. No exam data present  General: Obese elderly pleasant elderly Caucasian male, seated, in no evident distress Head: head normocephalic and atraumatic.   Neck: supple with no carotid or supraclavicular bruits Cardiovascular: regular rate and rhythm, no murmurs; +2 pitting edema BLE Musculoskeletal: no deformity Skin:  no rash/petichiae Vascular:  Normal pulses all extremities  Neurologic Exam Mental Status: Awake and fully alert. Oriented to place and time. Recent and remote memory intact. Attention span, concentration and fund of knowledge appropriate. Mood and affect appropriate.  Diminished recall 2/3.  Able to name only 10 animals which can walk on 4 legs.  Clock drawing 4/4.  Affect not depressed Cranial Nerves: Fundoscopic exam reveals sharp disc margins. Pupils equal, briskly reactive to light. Extraocular movements full without nystagmus. Visual fields full to confrontation. Hearing intact. Facial sensation intact. Face, tongue, palate moves normally and symmetrically.  Motor: Normal bulk and tone. Normal strength in all tested extremity muscles.  Diminished fine finger movements on the left.  Orbits right over left upper extremity.  Mild weakness of left grip.  Mild weakness of ankle dorsiflexors and left hip flexors.  Sensory.:  Intact to touch , pinprick , position and vibratory sensation.  Coordination: Rapid alternating movements normal in all extremities. Finger-to-nose and  heel-to-shin performed accurately bilaterally.  Very minimal amount of orbiting right arm of her left. Gait and Station: Arises from chair with mild difficulty. Stance is normal. Gait uses a cane drags left leg a bit.  Demonstrates normal stride length and balance .  Not able to heel, toe and tandem walk without difficulty.  Reflexes: 1+ and symmetric. Toes downgoing.      Diagnostic Data (Labs, Imaging, Testing)  CT HEAD WO CONTRAST  08/30/2017 IMPRESSION: 1. No acute intracranial findings. 2. Atrophy and white matter microvascular disease.  MR BRAIN WO CONTRAST 08/31/2017 IMPRESSION: No acute abnormality The patient was  not able to complete the study ADDENDUM: After further review, there are 2 small areas of restricted diffusion in the right frontal cortex posteriorly compatible with acute infarct.  VAS [place] DUPLEX 08/31/2017 Final Interpretation: Right Carotid: Velocities in the right ICA are consistent with a 1-39% stenosis. Left Carotid: Study limited and ended due to patient was combative. Vertebrals: Not done. Subclavians: Not done.  ECHO COMPLETE 08/31/2017 Study Conclusions - Left ventricle: The cavity size was normal. There was moderate   concentric hypertrophy. Systolic function was normal. The   estimated ejection fraction  was in the range of 60% to 65%.   Doppler parameters are consistent with abnormal left ventricular   relaxation (grade 1 diastolic dysfunction). - Left atrium: The atrium was moderately dilated. - Pericardium, extracardiac: A trivial pericardial effusion was   identified.  ECHO TEE  09/01/2017 Negative PFO No evidence of thrombus in the atrial cavity or appendage    ASSESSMENT: David [name] is a 78 y.o. year old male here with 2 small right frontal cortical infarcts on 08/30/2017 secondary to cryptogenic source. Vascular risk factors include DM, HTN and HLD.  Patient had recent admission in August 2020 secondary to confusion and  disorientation in setting of hypertensive urgency but now persistent mild memory and cognitive symptoms indicative of mild cognitive impairment   PLAN: I had a long discussion with the patient and his wife regarding his memory loss and mild cognitive impairment following a stroke and reviewed recent hospitalization and test results and answered questions.  I recommend he start taking fish oil 1000 mg daily as well as increase participation in cognitively challenging activities like solving crossword puzzles, playing bridge and sudoku.  Check memory panel labs and EEG to look for reversible causes.  We also discussed memory compensation strategies.  Continue Plavix for stroke prevention with strict control of hypertension with blood pressure goal below 130/90, lipids with LDL cholesterol goal below 70 mg percent and diabetes with hemoglobin A1c goal below 6.5%.  Is also encouraged to eat a healthy diet and to exercise regularly and lose weight.  He was advised to ambulate using a cane at all times and to avoid falls and injuries.  He will return for follow-up in 3 months or call earlier if necessary.Greater than 50% of time during this 25 minute visit was spent on counseling,explanation of diagnosis of strokes,mild cognitive impairment, reviewing risk factor management , planning of further management, discussion with patient and family and coordination of care    Antony Contras, MD  [facility] [address] [place], [place] [address]  Phone [phone] Fax [phone]

## 2019-03-07 ENCOUNTER — Encounter: Payer: Self-pay | Admitting: Podiatry

## 2019-03-07 DIAGNOSIS — E11649 Type 2 diabetes mellitus with hypoglycemia without coma: Secondary | ICD-10-CM | POA: Diagnosis not present

## 2019-03-07 DIAGNOSIS — E11621 Type 2 diabetes mellitus with foot ulcer: Secondary | ICD-10-CM | POA: Diagnosis not present

## 2019-03-07 DIAGNOSIS — I130 Hypertensive heart and chronic kidney disease with heart failure and stage 1 through stage 4 chronic kidney disease, or unspecified chronic kidney disease: Secondary | ICD-10-CM | POA: Diagnosis not present

## 2019-03-07 DIAGNOSIS — L97511 Non-pressure chronic ulcer of other part of right foot limited to breakdown of skin: Secondary | ICD-10-CM | POA: Diagnosis not present

## 2019-03-07 DIAGNOSIS — I5032 Chronic diastolic (congestive) heart failure: Secondary | ICD-10-CM | POA: Diagnosis not present

## 2019-03-07 DIAGNOSIS — E1159 Type 2 diabetes mellitus with other circulatory complications: Secondary | ICD-10-CM | POA: Diagnosis not present

## 2019-03-07 LAB — CUP PACEART REMOTE DEVICE CHECK
Battery Remaining Longevity: 54 mo
Battery Voltage: 2.99 V
Brady Statistic AP VP Percent: 0.04 %
Brady Statistic AP VS Percent: 99.66 %
Brady Statistic AS VP Percent: 0 %
Brady Statistic AS VS Percent: 0.3 %
Brady Statistic RA Percent Paced: 99.68 %
Brady Statistic RV Percent Paced: 0.04 %
Date Time Interrogation Session: 20201021155206
Implantable Lead Implant Date: 20091102
Implantable Lead Implant Date: 20091102
Implantable Lead Location: 753859
Implantable Lead Location: 753860
Implantable Lead Model: 5076
Implantable Lead Model: 5076
Implantable Pulse Generator Implant Date: 20170118
Lead Channel Impedance Value: 1197 Ohm
Lead Channel Impedance Value: 399 Ohm
Lead Channel Impedance Value: 418 Ohm
Lead Channel Impedance Value: 532 Ohm
Lead Channel Pacing Threshold Amplitude: 2.125 V
Lead Channel Pacing Threshold Amplitude: 2.375 V
Lead Channel Pacing Threshold Pulse Width: 0.4 ms
Lead Channel Pacing Threshold Pulse Width: 0.4 ms
Lead Channel Sensing Intrinsic Amplitude: 1.625 mV
Lead Channel Sensing Intrinsic Amplitude: 1.625 mV
Lead Channel Sensing Intrinsic Amplitude: 19.625 mV
Lead Channel Sensing Intrinsic Amplitude: 19.625 mV
Lead Channel Setting Pacing Amplitude: 2.5 V
Lead Channel Setting Pacing Amplitude: 4.25 V
Lead Channel Setting Pacing Pulse Width: 0.4 ms
Lead Channel Setting Sensing Sensitivity: 2.8 mV

## 2019-03-07 LAB — DEMENTIA PANEL
Homocysteine: 22.1 umol/L — ABNORMAL HIGH (ref 0.0–19.2)
RPR Ser Ql: NONREACTIVE
TSH: 2.41 u[IU]/mL (ref 0.450–4.500)
Vitamin B-12: 500 pg/mL (ref 232–1245)

## 2019-03-07 NOTE — Progress Notes (Signed)
Subjective:  Patient ID: David [name], male    DOB: Nov 21, 1940,  MRN: [id number]  Chief Complaint  Patient presents with  . Wound Check    Right posterior heel wound check. Pt states wound is open but does not have much drainage     78 y.o. male presents for wound care.  I saw the patient for right heel ulceration.  He is well-known to Dr. Adah Perl.  He developed this heel ulcer over the last couple of weeks it was blistering up but has been much better since then. He has applying triple antibitoics and band aid. He denies any N/F/C/V/SOB/CP   Review of Systems: Negative except as noted in the HPI. Denies N/V/F/Ch.  Past Medical History:  Diagnosis Date  . Acute cholecystitis s/p lap cholecystectomy 03/05/2017 03/04/2017  . Anemia, iron deficiency   . Anxiety   . Arthritis   . Atrial fibrillation with rapid ventricular response ([place])   . BPH (benign prostatic hypertrophy)   . CAD (coronary artery disease)    Nonobstructive CAD per cath  . Cardiac pacemaker in situ   . CHF (congestive heart failure) ([place])   . Chronic ulcer of right foot ([place])   . CKD (chronic kidney disease) stage 4, GFR 15-29 ml/min (HCC) 09/26/2017  . 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 Coronary artery disease involving native coronary artery of native heart without angina pectoris   . Diastolic dysfunction   . Dyspnea   . 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 History of cellulitis    right great toe 10-25-2014  . History of skin cancer   . HOCM (hypertrophic obstructive cardiomyopathy) ([place])   . Hyperlipidemia associated with type 2 diabetes mellitus ([place]) 10/25/2013  . Hypertension   . Hypertension associated with diabetes ([place]) 10/25/2013  . 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 Insulin pump in place   . OSA on CPAP   . 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 Peripheral neuropathy    severe  . Peripheral vascular disease ([place])    bilateral lower extremities  . PVD (peripheral vascular disease) ([place])   . Rib fracture 07/24/2015  . Seasonal and perennial allergic rhinitis 10/25/2013  . Seborrheic keratosis 09/19/2016  . Secondary hyperparathyroidism of renal origin ([place])   . 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 Sinus node dysfunction (HCC)   . Sleep apnea   . Syncope 07/24/2015    Current Outpatient Medications:  .  acetaminophen (TYLENOL) 325 MG tablet, Take 1-2 tablets (325-650 mg total) by mouth every 4 (four) hours as needed for mild pain., Disp: , Rfl:  .  amLODipine (NORVASC) 5 MG tablet, Take 1 tablet (5 mg total) by mouth daily., Disp: 90 tablet, Rfl: 0 .  atorvastatin (LIPITOR) 10 MG tablet, TAKE 1 TABLET BY MOUTH ONCE DAILY AT  6PM, Disp: 90 tablet, Rfl: 0 .  blood glucose meter kit and supplies KIT, Dispense based on patient and insurance preference. Use up to four times daily as directed. (FOR ICD-9 250.00, 250.01)., Disp: 1 each, Rfl: 0 .  calcitRIOL (ROCALTROL) 0.25 MCG capsule, Take 1 capsule by mouth once daily, Disp: 90 capsule, Rfl: 0 .  carvedilol (COREG) 25 MG tablet, TAKE 1 TABLET BY MOUTH TWICE DAILY WITH A MEAL,  Disp: 180 tablet, Rfl: 0 .  cholecalciferol (VITAMIN D) 1000 units tablet, Take 1 tablet (1,000 Units total) by mouth daily., Disp: 30 tablet, Rfl: 1 .  cholestyramine light (PREVALITE) 4 g packet, Take 1 packet (4 g total) by mouth 2 (two) times daily., Disp: 60 each, Rfl: 5 .  clopidogrel (PLAVIX) 75 MG tablet, Take 1 tablet by mouth once daily, Disp: 90 tablet, Rfl: 0 .  diclofenac sodium (VOLTAREN) 1 % GEL, Apply 2 g topically 4 (four) times daily., Disp: 150 g, Rfl: 0 .  glucose blood test strip, Use Contour test strips as instructed to check blood sugar four times daily., Disp: 200 each, Rfl: 2 .  hydrALAZINE (APRESOLINE) 100 MG  tablet, Take 100 mg by mouth 3 (three) times daily. Take 1 tablet by mouth three times daily., Disp: , Rfl:  .  Insulin Syringe-Needle U-100 (INSULIN SYRINGE 1CC/31GX5/16") 31G X 5/16" 1 ML MISC, 1 each by Does not apply route 3 (three) times daily. Use insulin syringe to inject insulin three times daily., Disp: 100 each, Rfl: 2 .  isosorbide mononitrate (IMDUR) 30 MG 24 hr tablet, Take 2 tablets by mouth once daily, Disp: 180 tablet, Rfl: 3 .  liraglutide (VICTOZA) 18 MG/3ML SOPN, Inject 0.3 mLs (1.8 mg total) into the skin daily before supper., Disp: 9 pen, Rfl: 1 .  mupirocin ointment (BACTROBAN) 2 %, Apply to right heel once daily, Disp: 22 g, Rfl: 0 .  NOVOLOG MIX 70/30 FLEXPEN (70-30) 100 UNIT/ML FlexPen, Inject 40 units under the skin twice daily. DX:E11.65, Disp: 10 pen, Rfl: 2 .  pantoprazole (PROTONIX) 40 MG tablet, Take 1 tablet (40 mg total) by mouth daily., Disp: 90 tablet, Rfl: 1 .  sertraline (ZOLOFT) 100 MG tablet, Take 100 mg by mouth daily., Disp: , Rfl:  .  torsemide (DEMADEX) 20 MG tablet, Take 2 tablets (34m) by mouth every OTHER day., Disp: 180 tablet, Rfl: 3 .  vitamin B-12 (CYANOCOBALAMIN) 100 MCG tablet, Take 100 mcg by mouth daily., Disp: , Rfl:  .  Omega-3 Fatty Acids (FISH OIL) 1000 MG CAPS, Take 1 capsule (1,000 mg total) by mouth every morning., Disp: 30 capsule, Rfl: 1  Social History   Tobacco Use  Smoking Status Former Smoker  . Packs/day: 2.00  . Years: 30.00  . Pack years: 60.00  . Quit date: 03/03/1984  . Years since quitting: 35.0  Smokeless Tobacco Never Used    Allergies  Allergen Reactions  . Ativan [Lorazepam] Anxiety    Pt gets more agitated  . Adhesive [Tape] Other (See Comments)    blisters   Objective:  There were no vitals filed for this visit. There is no height or weight on file to calculate BMI. Constitutional Well developed. Well nourished.  Vascular Dorsalis pedis pulses palpable bilaterally. Posterior tibial pulses palpable  bilaterally. Capillary refill normal to all digits.  No cyanosis or clubbing noted. Pedal hair growth normal.  Neurologic Normal speech. Oriented to person, place, and time. Protective sensation absent  Dermatologic Right heel ulceration. Re-epithelization of the skin noted. Healing well. No erythema, malodor, frank pus noted.   Orthopedic: No pain to palpation either foot.   Radiographs: None Assessment:  No diagnosis found. Plan:  Patient was evaluated and treated and all questions answered.  Ulcer Right plantar heel -Debridement as below. -Dressed with triple abx and band ait, DSD. -Continue off-loading with surgical shoe.  Procedure: Excisional Debridement of Wound Rationale: Removal of non-viable soft tissue from the wound to promote  healing.  Anesthesia: none Pre-Debridement Wound Measurements: 0.5 cm x 0.5 cm x 0.1 cm  Post-Debridement Wound Measurements: 0.1 cm x 0.1 cm x 0.1 cm  Type of Debridement: Sharp Excisional Tissue Removed: Non-viable soft tissue Depth of Debridement: subcutaneous tissue. Technique: Sharp excisional debridement to bleeding, viable wound base.  Dressing: Dry, sterile, compression dressing. Disposition: Patient tolerated procedure well. Patient to return in 1 week for follow-up.  No follow-ups on file.

## 2019-03-08 ENCOUNTER — Encounter: Payer: Self-pay | Admitting: Family Medicine

## 2019-03-08 ENCOUNTER — Ambulatory Visit: Payer: Medicare Other | Admitting: Family Medicine

## 2019-03-11 ENCOUNTER — Other Ambulatory Visit: Payer: Self-pay | Admitting: *Deleted

## 2019-03-11 ENCOUNTER — Encounter: Payer: Self-pay | Admitting: Family Medicine

## 2019-03-11 ENCOUNTER — Telehealth: Payer: Self-pay

## 2019-03-11 ENCOUNTER — Encounter: Payer: Self-pay | Admitting: *Deleted

## 2019-03-11 NOTE — Patient Outreach (Signed)
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 Patient was discharged home to self-care with home health services. 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  HIPAA/ identity verified with patient's caregiver todayand she reports that "things are going very well and are under control." She denies that patient is in pain today, and she reports a new fall "about 2- 2 and a half weeks ago;" without injury- stated that patient was walking up the front porch steps and tripped.  Reports that she and family members were able to easily assist him up.  Reports patient continues using cane for ambulation and that she/ patient continue to have excellent support/ care assistance from their local adult children.  Reports that patient has developed recent "pressure sores" on his feet due to being in bed/ lying so much and confirms that she has taken patient to podiatrist, who has initiated plan of care-- stated that patient's feet are now being wrapped in lamb's wool while he is in bed, and that she dresses wounds twice daily.  Reports that patient is wearing a "special surgical boot/ shoe all the time," and that wounds to feet "are much better and continue healing." Caregiver denies clinical concerns today.   Caregiver further reports:  -- No concerns around medications; continues  taking as prescribed;denies questions/ concerns around current medications.  Caregiver continues managing all aspects of medication administration.  Medication review completed with caregiver today around recent changes made as a result of recent provider appointments- medication list in EMR updated according to caregiver report.  Obtained flu shot at time of recent endocrinology office visit  -- Allrecent provider appointmentswere reviewed withcaregivertoday; reports that she continues to provide transportation to provider appointments and confirms that patient has attended all as scheduled.  Has attended recent appointments to endocrinology, podiatry, and neurology providers.  -- home health services are now completed, as patient had met goals  Self-health management ofchronic disease state of DM: --confirms thatshe continues managing patient's blood sugar monitoring and insulin injections; reports "better control with less low numbers" with current insulin administration -- reports accurately patient's recent A1-C of "8.3;" states that she has spoken to endocrinologist and blood sugar goal is "to stay under 200," as patient also has had previous episodes of hypoglycemia with symptoms -- blood sugars "in general" more consistent on current 70/30 dosing; reports values "mainly between 170-180" consistently; reports only one isolated low blood sugar over last 2 weeks, stating that patient "dropped to 60;" verbalizes appropriate action plan for hypoglycemic episodes -- continues monitoring blood sugars 3-4 times per day -- denies that patient has dietary indiscretions states that she has been preparing patient's meals and limits carbs/ sugars- reports patient's appetite fluctuates, but with encouragement, she is able to convince patient to eat when it is time -- weights consistently ranging "between 275-176 lbs" -- signs/ symptoms CVA with corresponding action plans for both reviewed with  caregiver, who is able to verbalize  accurately with minimal prompting -- SDOH updated for:  Transportation; food insecurity; and financial resource strain- no concerns identified  Caregiverdenies further issues, concerns, or problems today. Iconfirmed thatshe andpatient hasmy direct phone number, the main [facility] CM office phone number, and the [facility] CM 24-hour nurse advice phone number should issues arise prior to next scheduled [facility] Community CM outreachnext month. Encouraged caregiverto contact me directly if needs, questions, issues, or concerns arise prior to next scheduled outreach; caregiver agreed to do so.  Plan:  Patient will take medications as prescribed and will attend all scheduled provider appointments  Patient/ caregiverwill promptly notify care providers for any new concerns/ issues/ problems that arise  Patient will actively participate in home health services as ordered post-hospital discharge  Patient/ caregiverwill continue monitoring/ recording daily blood sugars4 times per day  I will place printed educational material in mail to patient's caregiver around foot care for diabetics  [facility] Community CM outreach to continue with scheduled phone callto caregivernext month  [facility] CM Care Plan Problem Two     Most Recent Value  Care Plan Problem Two  Self-health management of chronic disease state of DM, as evidenced by patient's caregiver reporting   Role Documenting the Problem Two  Care Management Coordinator  Care Plan for Problem Two  Active  Interventions for Problem Two Long Term Goal   Reviewed with caregiver patient's current clinical condition and recent provider appointments,  reviewed recent blood sugars and confirmed that caregiver continues managing blood sugar checks and medications,  confirmed that caregiver understands appropriate action plan for isolated episodes of hypoglycemia and updated medication list in EMR according to recent changes made at  provider office visits, according to caregiver report/ verification  [facility] Long Term Goal  Over the next 60 days, patient/ caregiver will continue to monitor/ record patient's blood sugars 3-4 times a day, as evidenced by review of same and caregiver reporting during [facility] RN CM outreach  [facility]al Start Date  02/11/19  [facility] CM Short Term Goal #1   Over the next 30 days, patient's caregiver will monitor patient's skin/ feet daily with established plan of care for foot care/ wound treatment, as evidenced by caregiver reporting during [facility] RN CM outreach  [facility] CM Short Term Goal #1 Start Date  03/11/19  Interventions for Short Term Goal #2   Discussed with caregiver patient's recently developed foot/ skin wound status and confirmed that caregiver understands and is able to verbalize accurate understanding of treatment plan of care and frequent monitoring of skin,  placed printed EMMI educational material in mail to caregiver around foot care/ skin care in DM for her review/ further discussion  [facility] CM Short Term Goal #2   Over the next 30 days, patient will attend all scheduled provider appointments, as evidenced by patient caregiver reporting during [facility] RN CM outreach  [facility] CM Short Term Goal #2 Start Date  03/11/19  Interventions for Short Term Goal #2  Reviewed upcoming provider appointments and confirmed that caregiver continues providing transportation and plans to attend all,  provided positive reinforcement for caregiver consistently notifying care providers promptly for new concerns/ issues/ problems      Oneta Rack, RN, BSN, [employer] Coordinator [facility] Care Management  [phone]

## 2019-03-11 NOTE — Telephone Encounter (Signed)
I call pts wife that pts homocysteine level is slightly elevated and to start taking folic acid 1mg  daily. I stated the other labs in the dementia panel were normal. I stated it can be purchase over the counter.The wife verbalized understanding.  ------

## 2019-03-11 NOTE — Telephone Encounter (Signed)
-----   Message from Garvin Fila, MD sent at 03/08/2019 11:18 AM EDT ----- Mitchell Heir inform that homocysteine level is slightly elevated and to start folic acid 1 mg daily

## 2019-03-22 NOTE — Progress Notes (Signed)
Remote pacemaker transmission.   

## 2019-03-25 ENCOUNTER — Ambulatory Visit (INDEPENDENT_AMBULATORY_CARE_PROVIDER_SITE_OTHER): Payer: Medicare Other

## 2019-03-25 ENCOUNTER — Encounter: Payer: Self-pay | Admitting: Podiatry

## 2019-03-25 ENCOUNTER — Other Ambulatory Visit: Payer: Self-pay

## 2019-03-25 ENCOUNTER — Ambulatory Visit (INDEPENDENT_AMBULATORY_CARE_PROVIDER_SITE_OTHER): Payer: Medicare Other | Admitting: Podiatry

## 2019-03-25 DIAGNOSIS — L89612 Pressure ulcer of right heel, stage 2: Secondary | ICD-10-CM | POA: Diagnosis not present

## 2019-03-25 DIAGNOSIS — R410 Disorientation, unspecified: Secondary | ICD-10-CM

## 2019-03-25 DIAGNOSIS — R413 Other amnesia: Secondary | ICD-10-CM

## 2019-03-25 DIAGNOSIS — Z899 Acquired absence of limb, unspecified: Secondary | ICD-10-CM | POA: Diagnosis not present

## 2019-03-25 DIAGNOSIS — E1151 Type 2 diabetes mellitus with diabetic peripheral angiopathy without gangrene: Secondary | ICD-10-CM | POA: Diagnosis not present

## 2019-03-25 DIAGNOSIS — G3184 Mild cognitive impairment, so stated: Secondary | ICD-10-CM

## 2019-03-25 NOTE — Progress Notes (Signed)
Subjective:  Patient ID: David [name], male    DOB: 06-28-1940,  MRN: [id number]  Chief Complaint  Patient presents with  . Wound Check    pt is here for a wound check of the right foot, pt states that he is doing a lot better since the last time he was here, pt shows no signs of infection, pt has no other comments or concerns    78 y.o. male presents for wound care.  Patient is here for follow-up of the right heel ulceration.  He states that he has been applying mupirocin ointment and a Band-Aid daily.  He was wearing surgical shoe but has transitioned himself out of the surgical shoe.  He states that the wound looks much better and it is healed.  He denies any nausea fever chills vomiting.  He states that he is ambulating with regular sneakers/diabetic shoes.   Review of Systems: Negative except as noted in the HPI. Denies N/V/F/Ch.  Past Medical History:  Diagnosis Date  . Acute cholecystitis s/p lap cholecystectomy 03/05/2017 03/04/2017  . Anemia, iron deficiency   . Anxiety   . Arthritis   . Atrial fibrillation with rapid ventricular response ([place])   . BPH (benign prostatic hypertrophy)   . CAD (coronary artery disease)    Nonobstructive CAD per cath  . Cardiac pacemaker in situ   . CHF (congestive heart failure) ([place])   . Chronic ulcer of right foot ([place])   . CKD (chronic kidney disease) stage 4, GFR 15-29 ml/min (HCC) 09/26/2017  . 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 Coronary artery disease involving native coronary artery of native heart without angina pectoris   . Diastolic dysfunction   . Dyspnea   . 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 History of cellulitis    right great toe 10-25-2014  . History of skin cancer   . HOCM (hypertrophic obstructive cardiomyopathy) ([place])   . Hyperlipidemia associated with type 2 diabetes mellitus ([place]) 10/25/2013  . Hypertension    . Hypertension associated with diabetes ([place]) 10/25/2013  . 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 Insulin dependent type 2 diabetes mellitus ([place]) 1991   followd by [name]--  has insulin pump  . Insulin pump in place   . OSA on CPAP   . 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 Peripheral neuropathy    severe  . Peripheral vascular disease ([place])    bilateral lower extremities  . PVD (peripheral vascular disease) ([place])   . Rib fracture 07/24/2015  . Seasonal and perennial allergic rhinitis 10/25/2013  . Seborrheic keratosis 09/19/2016  . Secondary hyperparathyroidism of renal origin ([place])   . 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 Sinus node dysfunction (HCC)   . Sleep apnea   . Syncope 07/24/2015    Current Outpatient Medications:  .  acetaminophen (TYLENOL) 325 MG tablet, Take 1-2 tablets (325-650 mg total) by mouth every 4 (four) hours as needed for mild pain., Disp: , Rfl:  .  atorvastatin (LIPITOR) 10 MG tablet, TAKE 1 TABLET BY MOUTH ONCE DAILY AT  6PM, Disp: 90 tablet, Rfl: 0 .  blood glucose meter kit and supplies KIT, Dispense based on patient and insurance preference. Use up to four times daily as directed. (FOR ICD-9 250.00, 250.01)., Disp: 1 each, Rfl: 0 .  calcitRIOL (ROCALTROL) 0.25 MCG capsule, Take 1 capsule by mouth once  daily, Disp: 90 capsule, Rfl: 0 .  carvedilol (COREG) 25 MG tablet, TAKE 1 TABLET BY MOUTH TWICE DAILY WITH A MEAL, Disp: 180 tablet, Rfl: 0 .  cholecalciferol (VITAMIN D) 1000 units tablet, Take 1 tablet (1,000 Units total) by mouth daily., Disp: 30 tablet, Rfl: 1 .  cholestyramine light (PREVALITE) 4 g packet, Take 1 packet (4 g total) by mouth 2 (two) times daily., Disp: 60 each, Rfl: 5 .  clopidogrel (PLAVIX) 75 MG tablet, Take 1 tablet by mouth once daily, Disp: 90 tablet, Rfl: 0 .  diclofenac sodium (VOLTAREN) 1 % GEL, Apply 2 g topically 4 (four) times daily., Disp: 150 g, Rfl: 0 .  glucose blood test strip, Use Contour test strips as  instructed to check blood sugar four times daily., Disp: 200 each, Rfl: 2 .  hydrALAZINE (APRESOLINE) 100 MG tablet, Take 100 mg by mouth 3 (three) times daily. Take 1 tablet by mouth three times daily., Disp: , Rfl:  .  Insulin Syringe-Needle U-100 (INSULIN SYRINGE 1CC/31GX5/16") 31G X 5/16" 1 ML MISC, 1 each by Does not apply route 3 (three) times daily. Use insulin syringe to inject insulin three times daily., Disp: 100 each, Rfl: 2 .  isosorbide mononitrate (IMDUR) 30 MG 24 hr tablet, Take 2 tablets by mouth once daily, Disp: 180 tablet, Rfl: 3 .  liraglutide (VICTOZA) 18 MG/3ML SOPN, Inject 0.3 mLs (1.8 mg total) into the skin daily before supper., Disp: 9 pen, Rfl: 1 .  NOVOLOG MIX 70/30 FLEXPEN (70-30) 100 UNIT/ML FlexPen, Inject 40 units under the skin twice daily. DX:E11.65, Disp: 10 pen, Rfl: 2 .  Omega-3 Fatty Acids (FISH OIL) 1000 MG CAPS, Take 1 capsule (1,000 mg total) by mouth every morning., Disp: 30 capsule, Rfl: 1 .  pantoprazole (PROTONIX) 40 MG tablet, Take 1 tablet (40 mg total) by mouth daily., Disp: 90 tablet, Rfl: 1 .  sertraline (ZOLOFT) 100 MG tablet, Take 100 mg by mouth daily., Disp: , Rfl:  .  torsemide (DEMADEX) 20 MG tablet, Take 2 tablets (36m) by mouth every OTHER day., Disp: 180 tablet, Rfl: 3 .  vitamin B-12 (CYANOCOBALAMIN) 100 MCG tablet, Take 100 mcg by mouth daily., Disp: , Rfl:  .  amLODipine (NORVASC) 5 MG tablet, Take 1 tablet (5 mg total) by mouth daily., Disp: 90 tablet, Rfl: 0  Social History   Tobacco Use  Smoking Status Former Smoker  . Packs/day: 2.00  . Years: 30.00  . Pack years: 60.00  . Quit date: 03/03/1984  . Years since quitting: 35.0  Smokeless Tobacco Never Used    Allergies  Allergen Reactions  . Ativan [Lorazepam] Anxiety    Pt gets more agitated  . Adhesive [Tape] Other (See Comments)    blisters   Objective:  There were no vitals filed for this visit. There is no height or weight on file to calculate BMI. Constitutional  Well developed. Well nourished.  Vascular Dorsalis pedis pulses palpable bilaterally. Posterior tibial pulses palpable bilaterally. Capillary refill normal to all digits.  No cyanosis or clubbing noted. Pedal hair growth normal.  Neurologic Normal speech. Oriented to person, place, and time. Protective sensation absent  Dermatologic  right heel hyperkeratotic lesion noted status post debridement underlying skin completely reepithelialized with no signs of infection noted.  Orthopedic: No pain to palpation either foot.   Radiographs: None Assessment:  No diagnosis found. Plan:  Patient was evaluated and treated and all questions answered.  Ulcer right heel -The ulceration is completely reepithelialized.  No clinical signs of infection noted including malodor or erythema or purulent drainage. -Dressed with mupirocin and Band-Aid, DSD. -Patient will transition into his diabetic shoes. -As the ulceration is healed I will now start seeing patients primarily for diabetic foot evaluation and care.  I explained to the patient that if the ulceration develops or any other acute issues, see me right away.   No follow-ups on file.

## 2019-03-26 DIAGNOSIS — H43813 Vitreous degeneration, bilateral: Secondary | ICD-10-CM | POA: Diagnosis not present

## 2019-03-26 DIAGNOSIS — E113313 Type 2 diabetes mellitus with moderate nonproliferative diabetic retinopathy with macular edema, bilateral: Secondary | ICD-10-CM | POA: Diagnosis not present

## 2019-03-26 DIAGNOSIS — H35373 Puckering of macula, bilateral: Secondary | ICD-10-CM | POA: Diagnosis not present

## 2019-03-28 ENCOUNTER — Other Ambulatory Visit: Payer: Self-pay | Admitting: *Deleted

## 2019-03-28 ENCOUNTER — Encounter: Payer: Self-pay | Admitting: *Deleted

## 2019-03-28 NOTE — Patient Outreach (Signed)
[place] [facility]) Care Management [place] Telephone Outreach Post-hospital discharge day # 75  03/28/2019  JEP DYAS 1941-03-17 [id number]  Successful telephone outreach toKaren Georgana Curio, wife/ caregiver, on [facility] DPR Sammie Denner, 78 y/o male referred to [facility] Liaison RN [place] 2 recent hospitalizations August 22-24, 2020 for AMS/ neurological changes suspected hypertensive encephalopathy and then again on August 26-29, 2020 for AMS/ confusion and hypoglycemia. Patient was discharged home to self-care with home health services, which are now completed (patient met goals). 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  HIPAA/ identity verified with patient's caregiver todayand she reports that "things are still goingwell and are under control." However, she reports that patient had "another fall on March 18, 2019;" states that patient fell at the same location he did a month ago, stepping up to get on to the porch.  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  Confirms no falls in other locations at their home.  Caregiver volunteers that she has already hired a Animator to Teacher, music railing at this area of the [place], and is hopeful that the railing will be installed this weekend, weather permitting.  Discussed fall risk/ prevention/ and complications thoroughly with caregiver, who verbalizes a good understanding of same and confirms that patient continues using cane for all ambulation.  Caregiver denies serious injury with most recent fall and states that a neighbor assisted her in getting patient up from most recent fall.  Denies that patient is in pain/ distress and  caregiver denies other concerns/ issues/ problems.  Caregiver further reports:  -- No concerns around medications; continuestakingas prescribed;denies questions/ concerns around current medications.  Caregiver continues managing all aspects of medication administration.  caregiver reports that she adjusted patient's insulin dosing as instructed by endocrinology provider for a 2- day episode that patient had of high blood sugars > 300, and she stated that this adjustment "took care of the high numbers."  EMR updated to reflect caregiver reporting of current insulin dosing  -- attended podiatry appointment 03/25/2019 for evaluation of patient's recently developed (L) foot wound and told "foot wound healed;" caregiver confirms that she continues monitoring patients skin/ feet daily; attended recent neurology appointment and had EEG- awaiting results.  Upcoming provider appointments with reviewed with caregiver who confirms she will continue to provide transportation   Self-health management ofchronic disease state of DM: --confirms thatshe continues managing patient's blood sugar monitoring and insulin injections; reports recent 2-3 day episode of patient "running higher than usual, in low 300's" and states that she adjusted insulin doses accordingly, following guidelines set forth at time of last endocrinology provider appointment.  Reports after several days, "blood sugars returned to normal range." -- states that patient's blood sugars now running "between 180-200" for all values, and states post-endocrinology goal office visit goal to keep blood sugars at home "at 200 or lower, but not lower than 100-125" due to patient's predisposition to hypoglycemia.  Denies signs/ symptoms hypoglycemia, and states that patient's only symptoms of high blood sugar during recent episode was headache. Caregiver remains able to verbalize signs/ symptoms high/ low blood sugar with appropriate action plan for  same. -- continues monitoring blood sugars 3-4 times per day --  previously denied dietary indiscretions by patient, but today, states that they had a family member bring donuts to their home and she believes patient "snuck" and "ate some" when caregiver was not in the room, which she believes led to his recent episode of hyperglycemia; caregiver has now started trying to make sure that any sweet/ high carb foods "are kept out of his site" and are not available for patient to indulge in without her supervision. -- discussed caregiver support for herself- she confirms that her adult children remain actively involved in caring for both she and patient; discussed option of private duty in-home care and she reports that she "is already considering."  For now, caregiver declines my placing [place] referral to assist in this process, stating she has a neighbor who is familiar with private duty options, and she has been discussing with.  Caregiverdenies further issues, concerns, or problems today. We discussed that together, she/ patient have made good progress meeting patient's previously established goals, along with possible transfer to [place] at time of next outreach, which we agreed to be in 3 weeks; caregiver is agreeable to this, depending on how patient is progressing at that time.  Iconfirmed thatshe andpatient hasmy direct phone number, the main [facility], The CM office phone number, and the [facility] CM 24-hour nurse advice phone number should issues arise prior to next scheduled [place] outreach. Encouraged caregiverto contact me directly if needs, questions, issues, or concerns arise prior to next scheduled outreach;caregiveragreed to do so.  Plan:  Patient will take medications as prescribed and will attend all scheduled provider appointments  Patient/ caregiverwill promptly notify care providers for any new concerns/ issues/ problems that arise  Patient/ caregiverwill  continue monitoring/ recording daily blood sugars4 times per day  [facility] CM outreach to continue with scheduled phone callto caregivernext month, possibly for transfer to [place] Problem One     Most Recent Value  Care Plan Problem One  High Fall risk, as evidenced by caregiver reporting of frequent and increased falls without serious injury  Role Documenting the Problem One  Care Management Coordinator  Care Plan for Problem One  Active  [facility] CM Short Term Goal #1   Over the next 20 days, patient will not experience any new falls, as evidenced by caregiver reporting during [facility] RN CM outreach  [facility] CM Short Term Goal #1 Start Date  03/28/19  Interventions for Short Term Goal #1  Reviewed recent fall episodes with caregiver and discussed established plan in place to decrease falls at home,  provided education around fall risks/ prevention/ complications, and confirmed that caregiver is able to independently verbalize good understanding of importance of fall prevention    [facility] CM Care Plan Problem Two     Most Recent Value  Care Plan Problem Two  Self-health management of chronic disease state of DM, as evidenced by patient's caregiver reporting   Role Documenting the Problem Two  Care Management Coordinator  Care Plan for Problem Two  Active  Interventions for Problem Two Long Term Goal   confirmed that caregiver continues monitoring and recording blood sugars 4 times per day at home,  discussed recent blood sugar ranges and caregiver adjustment of insulin as instructed by endocrinology provider,  discussed recent episode of high blood sugars, which have now resolved  [facility] Long Term Goal  Over the next 60 days, patient/ caregiver will continue to monitor/ record patient's blood sugars  3-4 times a day, as evidenced by review of same and caregiver reporting during [place] outreach  [place] Term Goal Start Date  02/11/19  [facility] CM Short Term Goal #1   Over the  next 30 days, patient's caregiver will monitor patient's skin/ feet daily with established plan of care for foot care/ wound treatment, as evidenced by caregiver reporting during [facility] RN CM outreach  [facility] CM Short Term Goal #1 Start Date  03/11/19  Interventions for Short Term Goal #2   Confirmed that caregiver received previously provided printed educational material around foot care,  using teach back method, reviewed education with caregiver and confirmed that she continues monitoring patient's feet and overall skin condition status daily  [facility] CM Short Term Goal #2   Over the next 30 days, patient will attend all scheduled provider appointments, as evidenced by patient caregiver reporting during [facility] RN CM outreach  [facility] CM Short Term Goal #2 Start Date  03/11/19  Interventions for Short Term Goal #2  Reviewed recent and upcoming provider appointments with caregiver and confirmed that she continues to provide transportation to/ attend with all provider appointments for patient     Oneta Rack, RN, BSN, [employer] Coordinator [facility] Care Management  [phone]

## 2019-04-03 ENCOUNTER — Ambulatory Visit (INDEPENDENT_AMBULATORY_CARE_PROVIDER_SITE_OTHER): Payer: Medicare Other | Admitting: Cardiovascular Disease

## 2019-04-03 ENCOUNTER — Other Ambulatory Visit: Payer: Self-pay

## 2019-04-03 ENCOUNTER — Encounter: Payer: Self-pay | Admitting: Cardiovascular Disease

## 2019-04-03 VITALS — BP 108/38 | HR 70 | Temp 97.5°F | Ht 76.0 in | Wt 278.0 lb

## 2019-04-03 DIAGNOSIS — I10 Essential (primary) hypertension: Secondary | ICD-10-CM

## 2019-04-03 NOTE — Progress Notes (Signed)
Hypertension Clinic Care Guide Assessment:    Date:  04/03/2019   ID:  David [name], DOB [birth year], MRN [id number]  PCP:  [name], MD  Cardiologist:  No primary care provider on file.   Referring MD: [name], MD    Patient Identification:     History of Present Illness/Progress Notes:    Current Medications: Current Meds  Medication Sig  . acetaminophen (TYLENOL) 325 MG tablet Take 1-2 tablets (325-650 mg total) by mouth every 4 (four) hours as needed for mild pain.  Marland Kitchen amLODipine (NORVASC) 5 MG tablet Take 1 tablet (5 mg total) by mouth daily.  Marland Kitchen atorvastatin (LIPITOR) 10 MG tablet TAKE 1 TABLET BY MOUTH ONCE DAILY AT  6PM  . blood glucose meter kit and supplies KIT Dispense based on patient and insurance preference. Use up to four times daily as directed. (FOR ICD-9 250.00, 250.01).  . calcitRIOL (ROCALTROL) 0.25 MCG capsule Take 1 capsule by mouth once daily  . carvedilol (COREG) 25 MG tablet TAKE 1 TABLET BY MOUTH TWICE DAILY WITH A MEAL  . cholecalciferol (VITAMIN D) 1000 units tablet Take 1 tablet (1,000 Units total) by mouth daily.  . cholestyramine light (PREVALITE) 4 g packet Take 1 packet (4 g total) by mouth 2 (two) times daily.  . clopidogrel (PLAVIX) 75 MG tablet Take 1 tablet by mouth once daily  . diclofenac sodium (VOLTAREN) 1 % GEL Apply 2 g topically 4 (four) times daily.  Marland Kitchen glucose blood test strip Use Contour test strips as instructed to check blood sugar four times daily.  . hydrALAZINE (APRESOLINE) 100 MG tablet Take 100 mg by mouth 3 (three) times daily. Take 1 tablet by mouth three times daily.  . Insulin Syringe-Needle U-100 (INSULIN SYRINGE 1CC/31GX5/16") 31G X 5/16" 1 ML MISC 1 each by Does not apply route 3 (three) times daily. Use insulin syringe to inject insulin three times daily.  . isosorbide mononitrate (IMDUR) 30 MG 24 hr tablet Take 2 tablets by mouth once daily  . liraglutide (VICTOZA) 18 MG/3ML SOPN Inject 0.3 mLs (1.8 mg  total) into the skin daily before supper.  Marland Kitchen NOVOLOG MIX 70/30 FLEXPEN (70-30) 100 UNIT/ML FlexPen Inject 40 units under the skin twice daily. DX:E11.65  . Omega-3 Fatty Acids (FISH OIL) 1000 MG CAPS Take 1 capsule (1,000 mg total) by mouth every morning.  . pantoprazole (PROTONIX) 40 MG tablet Take 1 tablet (40 mg total) by mouth daily.  . sertraline (ZOLOFT) 100 MG tablet Take 100 mg by mouth daily.  Marland Kitchen torsemide (DEMADEX) 20 MG tablet Take 2 tablets (72m) by mouth every OTHER day.  . vitamin B-12 (CYANOCOBALAMIN) 100 MCG tablet Take 100 mcg by mouth daily.     Social History: Social History   Socioeconomic History  . Marital status: Married    Spouse name: Not on file  . Number of children: 3  . Years of education: 188 . Highest education level: Not on file  Occupational History  . Occupation: Retired  SScientific laboratory technician . Financial resource strain: Not hard at all  . Food insecurity    Worry: Never true    Inability: Never true  . Transportation needs    Medical: No    Non-medical: No  Tobacco Use  . Smoking status: Former Smoker    Packs/day: 2.00    Years: 30.00    Pack years: 60.00    Quit date: 03/03/1984    Years since quitting: 35.1  .  Smokeless tobacco: Never Used  Substance and Sexual Activity  . Alcohol use: Yes    Alcohol/week: 1.0 standard drinks    Types: 1 Glasses of wine per week    Comment: social  . Drug use: No  . Sexual activity: Not Currently  Lifestyle  . Physical activity    Days per week: Not on file    Minutes per session: Not on file  . Stress: Not on file  Relationships  . Social Herbalist on phone: Not on file    Gets together: Not on file    Attends religious service: Not on file    Active member of club or organization: Not on file    Attends meetings of clubs or organizations: Not on file    Relationship status: Not on file  Other Topics Concern  . Not on file  Social History Narrative   Currently resides with his wife.  1 dog. Fun/Hobby: Golf    Denies any religious beliefs effecting health care.       Challenges:     Opportunities:   Pt isn't isn't in any current need for community resources. Provided pt with contact information for ay further assistance. Pt and wife advised that they don't like leaving the house due to the current pandemic.   Patient Commitment/Agreement for Next Session:     Care Guide/Social Work Interventions   None at this time.  Follow up:     Horatio Pel  04/03/2019 1:57 PM    [facility]

## 2019-04-03 NOTE — Progress Notes (Signed)
Hypertension Clinic Initial Assessment:    Date:  04/03/2019   ID:  David [name], DOB 1940-08-08, MRN [id number]  PCP:  [name], MD  Cardiologist:  No primary care provider on file.  Nephrologist: Dr. Marval Regal  Referring MD: [name], MD   CC: Hypertension  History of Present Illness:    David [name] is a 78 y.o. male with hypertension, hypertrophic cardiomyopathy, SSS s/p PPM, prior stroke, diabetes, paroxysmal atrial fibrillation, non-obstructive CAD, OSA on CPAP, and CKD IV here to establish care in the hypertension clinic. He first saw Dr. Caryl Comes for sick sinus syndrome pacemaker.  He reports that he was first diagnosed with hypertension approximately 4-5 years ago.  He has struggled to control it but thinks it has been better since hydralazine was added 1 year ago.  It is ordered for tid but he takes it twice daily as his wife is afraid his BP will get too low.  He hasn't been checking his BP much lately, but it typically is around 140-170/50-70.  It typically doesn't get low.  His BP is managed by Dr. Arty Baumgartner.  Overall he has felt poorly.  He is chronically tired.  He doesn't get much exercise.  He has struggled with gait stability since his stroke with residual L sided weakness.  He has some exertional dyspnea but denies chest pain.  He has chronic LE edema that is stable on torsemide.  He gets a good urinary response with torsemide.  He notes that his appetite has been poor.  His wife cooks most meals and limits salt intake.     Past Medical History:  Diagnosis Date  . Acute cholecystitis s/p lap cholecystectomy 03/05/2017 03/04/2017  . Anemia, iron deficiency   . Anxiety   . Arthritis   . Atrial fibrillation with rapid ventricular response ([place])   . BPH (benign prostatic hypertrophy)   . CAD (coronary artery disease)    Nonobstructive CAD per cath  . Cardiac pacemaker in situ   . CHF (congestive heart failure) ([place])   . Chronic ulcer of right foot  ([place])   . CKD (chronic kidney disease) stage 4, GFR 15-29 ml/min (HCC) 09/26/2017  . 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 Coronary artery disease involving native coronary artery of native heart without angina pectoris   . Diastolic dysfunction   . Dyspnea   . 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 History of cellulitis    right great toe 10-25-2014  . History of skin cancer   . HOCM (hypertrophic obstructive cardiomyopathy) ([place])   . Hyperlipidemia associated with type 2 diabetes mellitus ([place]) 10/25/2013  . Hypertension   . Hypertension associated with diabetes ([place]) 10/25/2013  . 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 Insulin dependent type 2 diabetes mellitus ([place]) 1991   followd by [name]--  has insulin pump  . Insulin pump in place   . OSA on CPAP   . 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 Peripheral neuropathy    severe  . Peripheral vascular disease ([place])    bilateral lower extremities  . PVD (peripheral vascular disease) ([place])   . Rib fracture 07/24/2015  . Seasonal and perennial allergic rhinitis 10/25/2013  . Seborrheic keratosis 09/19/2016  . Secondary hyperparathyroidism of renal origin ([place])   . 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 Sinus node dysfunction (HCC)   .  Sleep apnea   . Syncope 07/24/2015    Past Surgical History:  Procedure Laterality Date  . AMPUTATION OF REPLICATED TOES  Mar 9147   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)  . AMPUTATION TOE Right 03/12/2015   Procedure: RIGHT HALLUS AMPUTATION ;  Surgeon: [name], MD;  Location: [facility];  Service: Podiatry;  Laterality: Right;  . CARDIAC CATHETERIZATION  11-25-2010   [place], [place]   Nonobstructive CAD  . CARDIAC PACEMAKER PLACEMENT  Nov 2009   [employer]  . CHOLECYSTECTOMY N/A 03/05/2017   Procedure: LAPAROSCOPIC CHOLECYSTECTOMY WITH INTRAOPERATIVE CHOLANGIOGRAM;   Surgeon: [name], MD;  Location: [facility];  Service: General;  Laterality: N/A;  . EP IMPLANTABLE DEVICE N/A 06/03/2015   Procedure: PPM Generator Changeout;  Surgeon: [name], MD;  Location: [facility];  Service: Cardiovascular;  Laterality: N/A;  . EXCISION BONE CYST Right 03/06/2015   Procedure: BONE BIOPSIES OF RIGHT FOOT;  Surgeon: [name], MD;  Location: [facility];  Service: Podiatry;  Laterality: Right;  . ORIF ANKLE FRACTURE Left 11/06/2014   Procedure: OPEN REDUCTION INTERNAL FIXATION (ORIF) LEFT  ANKLE FRACTURE;  Surgeon: [name], MD;  Location: [facility];  Service: Orthopedics;  Laterality: Left;  . TEE WITHOUT CARDIOVERSION N/A 09/01/2017   Procedure: TRANSESOPHAGEAL ECHOCARDIOGRAM (TEE);  Surgeon: [name], MD;  Location: [facility];  Service: Cardiovascular;  Laterality: N/A;  . TOTAL KNEE ARTHROPLASTY    . VEIN LIGATION AND STRIPPING      Current Medications: Current Meds  Medication Sig  . acetaminophen (TYLENOL) 325 MG tablet Take 1-2 tablets (325-650 mg total) by mouth every 4 (four) hours as needed for mild pain.  Marland Kitchen amLODipine (NORVASC) 5 MG tablet Take 1 tablet (5 mg total) by mouth daily.  Marland Kitchen atorvastatin (LIPITOR) 10 MG tablet TAKE 1 TABLET BY MOUTH ONCE DAILY AT  6PM  . blood glucose meter kit and supplies KIT Dispense based on patient and insurance preference. Use up to four times daily as directed. (FOR ICD-9 250.00, 250.01).  . calcitRIOL (ROCALTROL) 0.25 MCG capsule Take 1 capsule by mouth once daily  . carvedilol (COREG) 25 MG tablet TAKE 1 TABLET BY MOUTH TWICE DAILY WITH A MEAL  . cholecalciferol (VITAMIN D) 1000 units tablet Take 1 tablet (1,000 Units total) by mouth daily.  . cholestyramine light (PREVALITE) 4 g packet Take 1 packet (4 g total) by mouth 2 (two) times daily.  . clopidogrel (PLAVIX) 75 MG tablet Take 1 tablet by mouth once daily  . diclofenac sodium (VOLTAREN) 1 % GEL Apply 2 g  topically 4 (four) times daily.  Marland Kitchen glucose blood test strip Use Contour test strips as instructed to check blood sugar four times daily.  . hydrALAZINE (APRESOLINE) 100 MG tablet Take 100 mg by mouth 3 (three) times daily. Take 1 tablet by mouth three times daily.  . Insulin Syringe-Needle U-100 (INSULIN SYRINGE 1CC/31GX5/16") 31G X 5/16" 1 ML MISC 1 each by Does not apply route 3 (three) times daily. Use insulin syringe to inject insulin three times daily.  . isosorbide mononitrate (IMDUR) 30 MG 24 hr tablet Take 2 tablets by mouth once daily  . liraglutide (VICTOZA) 18 MG/3ML SOPN Inject 0.3 mLs (1.8 mg total) into the skin daily before supper.  Marland Kitchen NOVOLOG MIX 70/30 FLEXPEN (70-30) 100 UNIT/ML FlexPen Inject 40 units under the skin twice daily. DX:E11.65  . Omega-3 Fatty Acids (FISH OIL) 1000 MG CAPS Take 1 capsule (1,000 mg  total) by mouth every morning.  . pantoprazole (PROTONIX) 40 MG tablet Take 1 tablet (40 mg total) by mouth daily.  . sertraline (ZOLOFT) 100 MG tablet Take 100 mg by mouth daily.  Marland Kitchen torsemide (DEMADEX) 20 MG tablet Take 2 tablets (26m) by mouth every OTHER day.  . vitamin B-12 (CYANOCOBALAMIN) 100 MCG tablet Take 100 mcg by mouth daily.     Allergies:   Ativan [lorazepam] and Adhesive [tape]   Social History   Socioeconomic History  . Marital status: Married    Spouse name: Not on file  . Number of children: 3  . Years of education: 140 . Highest education level: Not on file  Occupational History  . Occupation: Retired  SScientific laboratory technician . Financial resource strain: Not hard at all  . Food insecurity    Worry: Never true    Inability: Never true  . Transportation needs    Medical: No    Non-medical: No  Tobacco Use  . Smoking status: Former Smoker    Packs/day: 2.00    Years: 30.00    Pack years: 60.00    Quit date: 03/03/1984    Years since quitting: 35.1  . Smokeless tobacco: Never Used  Substance and Sexual Activity  . Alcohol use: Yes     Alcohol/week: 1.0 standard drinks    Types: 1 Glasses of wine per week    Comment: social  . Drug use: No  . Sexual activity: Not Currently  Lifestyle  . Physical activity    Days per week: Not on file    Minutes per session: Not on file  . Stress: Not on file  Relationships  . Social [place] phone: Not on file    Gets together: Not on file    Attends religious service: Not on file    Active member of club or organization: Not on file    Attends meetings of clubs or organizations: Not on file    Relationship status: Not on file  Other Topics Concern  . Not on file  Social History Narrative   Currently resides with his wife. 1 dog. Fun/Hobby: Golf    Denies any religious beliefs effecting health care.      Family History: The patient's family history includes Cancer in his mother; Heart attack in his father; Stroke in his father.  ROS:   Please see the history of present illness.     All other systems reviewed and are negative.  EKGs/Labs/Other Studies Reviewed:    EKG:  EKG is not ordered today.  The ekg ordered 01/10/19 demonstrates APVS. Rate 75 bpm.  LAFB.  Repolarization abnromality.  Echo 01/07/19: IMPRESSIONS  1. The left ventricle has normal systolic function, with an ejection fraction of 55-60%. The cavity size was normal. Left ventricular diastolic Doppler parameters are consistent with impaired relaxation. Elevated left ventricular end-diastolic pressure.  2. The right ventricle has normal systolic function. The cavity was normal. There is no increase in right ventricular wall thickness. Right ventricular systolic pressure could not be assessed.  3. The aortic valve is tricuspid. Severe calcifcation of the non coronary cusp.  4. The aorta is normal unless otherwise noted.  5. The interatrial septum appears to be lipomatous.  6. The inferior vena cava was dilated in size with >50% respiratory variability.   Recent Labs: 01/05/2019: B Natriuretic  Peptide 429.9 01/12/2019: Hemoglobin 9.9; Magnesium 2.2; Platelets 179 03/01/2019: ALT 14; BUN 53; Creatinine, Ser 2.23; Potassium 4.6;  Sodium 140 03/06/2019: TSH 2.410   Recent Lipid Panel    Component Value Date/Time   CHOL 108 03/01/2019 1423   TRIG 255.0 (H) 03/01/2019 1423   HDL 22.10 (L) 03/01/2019 1423   CHOLHDL 5 03/01/2019 1423   VLDL 51.0 (H) 03/01/2019 1423   LDLCALC 73 01/06/2019 0558   LDLDIRECT 37.0 03/01/2019 1423    Physical Exam:    VS:  BP (!) 114/31   Pulse 70   Temp (!) 97.5 F (36.4 C)   Ht _0  (1.93 m)   Wt 278 lb (126.1 kg)   SpO2 99%   BMI 33.84 kg/m     Wt Readings from Last 3 Encounters:  04/03/19 278 lb (126.1 kg)  03/06/19 275 lb (124.7 kg)  03/05/19 277 lb 12.8 oz (126 kg)     VS:  BP (!) 108/38   Pulse 70   Temp (!) 97.5 F (36.4 C)   Ht _1  (1.93 m)   Wt 278 lb (126.1 kg)   SpO2 99%   BMI 33.84 kg/m  , BMI Body mass index is 33.84 kg/m. GENERAL:  Chronically ill-appearing HEENT: Pupils equal round and reactive, fundi not visualized, oral mucosa unremarkable NECK:  No jugular venous distention, waveform within normal limits, carotid upstroke brisk and symmetric, no bruits LUNGS:  Clear to auscultation bilaterally HEART:  RRR.  PMI not displaced or sustained,S1 and S2 within normal limits, no S3, no S4, no clicks, no rubs, no murmurs ABD:  Flat, positive bowel sounds normal in frequency in pitch, no bruits, no rebound, no guarding, no midline pulsatile mass, no hepatomegaly, no splenomegaly EXT:  2 plus pulses throughout, 2+ LE edema, no cyanosis no clubbing SKIN:  No rashes no nodules NEURO:  Cranial nerves II through XII grossly intact, motor grossly intact throughout PSYCH:  Cognitively intact, oriented to person place and time   ASSESSMENT:    No diagnosis found.  PLAN:    # Essential hypertension: BP is low today both initially and on repeat.  He is taking all medication as prescribed with the exception of  hydralazine which he takes bid instead of tid.  He reports that his blood pressure typically runs high.  It is possible that his blood pressure is lower now that he is losing weight.  His appetite has been quite poor.  Given that he already has a nephrologist who manages his hypertension and if anything his blood pressure is low today, we will not make any changes to his antihypertensive regimen today.  I have recommended that he check his blood pressure twice daily and bring it to his follow-up appointment with Dr. Arty Baumgartner next week.  We will not make any changes for now.  Continue carvedilol, amlodipine, hydralazine, Imdur, and torsemide for now.  If his blood pressure continues to run low he is always welcome to give [name] a call.  Given that he has two cardiologist and a nephrologist following him already, we will not make any changes to his antihypertensive regimen.  We will, however refer him to the PREP program through the [facility] where he will begin exercise and education virtually.  He is a fall risk given his left-sided weakness.   Disposition:    FU with MD/PharmD as needed  Medication Adjustments/Labs and Tests Ordered: Current medicines are reviewed at length with the patient today.  Concerns regarding medicines are outlined above.  No orders of the defined types were placed in this encounter.  No orders of the  defined types were placed in this encounter.    Signed, Skeet Latch, MD  04/03/2019 2:07 PM    [place]

## 2019-04-03 NOTE — Patient Instructions (Signed)
Medication Instructions:  Your physician recommends that you continue on your current medications as directed. Please refer to the Current Medication list given to you today.   Follow-Up: KEEP A LOG OF YOUR BLOOD PRESSURE AND TAKE TO YOUR NEXT FOLLOW UP    You will receive a phone call from the PREP exercise and nutrition program to schedule an initial assessment.

## 2019-04-04 ENCOUNTER — Encounter: Payer: Self-pay | Admitting: Family Medicine

## 2019-04-04 ENCOUNTER — Other Ambulatory Visit: Payer: Self-pay

## 2019-04-04 MED ORDER — CARVEDILOL 25 MG PO TABS: ORAL_TABLET | ORAL | 0 refills | Status: DC

## 2019-04-04 MED ORDER — ATORVASTATIN CALCIUM 10 MG PO TABS: ORAL_TABLET | ORAL | 1 refills | Status: DC

## 2019-04-04 MED ORDER — AMLODIPINE BESYLATE 5 MG PO TABS: 5.0000 mg | ORAL_TABLET | Freq: Every day | ORAL | 1 refills | Status: DC

## 2019-04-05 ENCOUNTER — Other Ambulatory Visit: Payer: Self-pay | Admitting: Endocrinology

## 2019-04-09 ENCOUNTER — Telehealth: Payer: Self-pay

## 2019-04-09 NOTE — Telephone Encounter (Signed)
Notes recorded by Marval Regal, RN on 04/09/2019 at 4:12 PM EST  I called pts wife that the EEG study mild showed mild slowing of brain wave activity which is nonspecific finding seen in variety of conditions including memory loss. No seizure activity noted.The wife verbalized understanding.

## 2019-04-09 NOTE — Telephone Encounter (Signed)
-----   Message from Garvin Fila, MD sent at 04/05/2019 12:31 PM EST ----- Kindly inform the patient that EEG study showed mild slowing of brain wave activity which is a nonspecific finding seen in a variety of conditions including memory loss.  No seizure activity noted.

## 2019-04-12 NOTE — Progress Notes (Signed)
Call placed to patient reference referral for PREP(virtual)  via [facility]) Initial concerns over how much activity but explained that initially program is chair based and very low impact. Will met via WebEx 2x week (M/W) for about 30-45 min to work thru cardio and strength training exercises together.  Patient agreeable to an intro to WebEx on Monday at 330pm so he can experience what he program will look like.  If patient is agreeable on Monday, he will Start Wednesday 12/2 at 10am.

## 2019-04-15 ENCOUNTER — Other Ambulatory Visit: Payer: Self-pay | Admitting: *Deleted

## 2019-04-15 ENCOUNTER — Encounter: Payer: Self-pay | Admitting: *Deleted

## 2019-04-15 NOTE — Patient Outreach (Signed)
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 Patient was discharged home to self-care with home health services, which are now completed (patient met goals). 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  HIPAA/ identity verified with patient's caregiver todayand she reports that "things are going great;" and she denies that patient has had new/ recent falls- reports that she successfully had hand railing installed at their home, which "has stopped" patient's falling around entryway of home, where all previous falls occurred; reports that patient continues using cane at home and walker when he goes out for assistive devices.  Previously provided education around fall risks/ prevention education were reiterated with caregiver today using teach back method.  Caregiver reports that she and patient had a good holiday weekend and she denies current concerns around patient's clinical condition.  Caregiver further reports:  --No concerns aroundmedications;continuestakingall as prescribed;denies questions/ concerns aroundcurrent medications. Caregiver continues managing all aspects of medication administration. Caregiver reports that she has not needed to adjust patient's insulin dosing since our last  outreach; states she continues maintaining close contact with patient's endocrinology provider, although she has not had any recent concerns.  States she plans to transport patient to upcoming endocrinology provider appointment for re-evaluation of his A1-C. ----- discussed with caregiver patient's recent A1-C trends and we discussed value of discussing goal blood sugars and A1-C at time of next endocrinology provider appointments-- caregiver acknowledges that she has been previously been told that provider prefers slightly higher blood sugars for patient, as he has a tendency to "go low" easily. ---- denies recent high/ low blood sugar values: reports "all running between "150-200's without any higher or lower numbers" ---- caregiver continues managing all aspects of patient's diet- continues following diabetic/ heart healthy diet ---- attended recent cardiology appointment 04/03/2019 at which time, patient's blood pressure was low, without patient symptoms; caregiver accurately reports that cardiologist deferred blood pressure management to renal provider; today she confirms that patient has renal provider office visit "soon" to discuss blood pressures; continues monitoring and recording blood pressures at home; states that mid- day dose of hydralazine was discontinued per renal provider after phone call to his office- states "this one change helped a lot."  Reports blood pressures since them, "ranging around 150/60" and she denies ongoing concerns/ low blood pressures at home.  Denies that patient has had symptoms when blood pressures run low- reports he is tired, but adds that this is also patient's baseline.    Caregiverdenies further issues, concerns, or problems today. We again discussed that she/ patient have made good progress meeting patient's previously established goals, along with transfer to [place] and caregiver is agreeable to this today.  Confirmed thatshe andpatient hasmy  direct phone number, the main [place] office phone number, and the [facility] CM 24-hour nurse advice phone number should issues arise prior to outreach by [place].  Plan:  Patient will continue to take medications as prescribed and will attend all scheduled provider appointments  Patient/ caregiverwill promptly notify care providers for any new concerns/ issues/ problems that arise  Patient/ caregiverwill continue monitoring/ recording daily blood sugars4 times per dayand following prescribed DM diet  I will close [place] CM case and place referral for [place] for ongoing patient/ caregiver support, and will make patient's PCP aware of same   [facility] CM Care Plan Problem One     Most Recent Value  Care Plan Problem One  High Fall risk, as evidenced by caregiver reporting of frequent and increased falls without serious injury  Role Documenting the Problem One  Care Management Coordinator  Care Plan for Problem One  Not Active  [facility] CM Short Term Goal #1   Over the next 20 days, patient will not experience any new falls, as evidenced by caregiver reporting during [facility] RN CM outreach  [facility] CM Short Term Goal #1 Start Date  03/28/19  [facility] CM Short Term Goal #1 Met Date  04/15/19 [Goal Met]  Interventions for Short Term Goal #1  Confirmed with spouse/ caregiver that patient has not had any more falls,  confirmed that caregiver completed installation of ramp at home entry way and that patient continues using assistive devices as indicated for ambulation,  previously provided education around falls prevention discussed and reiterated with caregiver, using teach back method    [facility] CM Care Plan Problem Two     Most Recent Value  Care Plan Problem Two  Self-health management of chronic disease state of DM, as evidenced by patient's caregiver reporting   Role Documenting the Problem Two  Care Management Coordinator  Care Plan for Problem Two  Active  Interventions for  Problem Two Long Term Goal   Confirmed that caregiver continues monitoring and recording daily blood sugars and managing patient's insulin at home,  confirmed that patient has had no recent low or high blood sugars,  confirmed that caregiver continues working with patient on dietary adherence and that caregiver monitors/ manages patient meals; placed referral for [place] for ongoing support for caregiver/ patient   [facility] Long Term Goal  Over the next 45 days, patient/ caregiver will continue to monitor/ record patient's blood sugars 3-4 times a day, as evidenced by review of same and caregiver reporting during [facility] RN CM outreach [Goal re-established/ modified today]  [facility] Long Term Goal Start Date  04/15/19 [Goal re-established modified today]  [facility] CM Short Term Goal #1   Over the next 30 days, patient's caregiver will monitor patient's skin/ feet daily with established plan of care for foot care/ wound treatment, as evidenced by caregiver reporting during [facility] RN CM outreach  [facility] CM Short Term Goal #1 Start Date  03/11/19  [facility] CM Short Term Goal #1 Met Date   04/15/19 [Goal met]  Interventions for Short Term Goal #2   Confirmed with caregiver that she continues monitoring and visualizing patient's feet daily,  confirmed that previously reported foot wound has now completely healed,  using teachback method, reiterated with caregiver previously provided education around importance of daily foot inspection/ care in setting of DM  [facility] CM Short Term Goal #2   Over the next 30 days, patient will attend all scheduled provider appointments, as evidenced by patient caregiver reporting during [facility] RN CM outreach  [facility] CM Short Term Goal #2 Start Date  03/11/19  [facility] CM Short Term Goal #2  Met Date  04/15/19 [Goal Met]  Interventions for Short Term Goal #2  Confirmed with caregiver that patient has attended all recent provider office visits and reviewed these visits with caregiver,  reviewed upcoming scheduled  provider office visits and confirmed that wife will continue to provide transportation to provider office visits and attend along with patient,  coached caregiver around value of discussing goal blood sugars and goal A1-C for patient at time of upcoming scheduled endocrinology appointment in mid- December     It has been a pleasure caring for Bubba Hales, RN, BSN, [place] Care Management  [phone]

## 2019-04-18 ENCOUNTER — Ambulatory Visit: Payer: Self-pay | Admitting: *Deleted

## 2019-04-18 NOTE — Telephone Encounter (Signed)
Left voice message for patient to call clinic.  

## 2019-04-18 NOTE — Telephone Encounter (Signed)
See note

## 2019-04-18 NOTE — Telephone Encounter (Signed)
Pt called with complaints of abdominal pain and uncontrollable diarrhea that started PM 04/17/2019; the pt says he started having "discomfort/pain" in his lower abdomen, it is constant rated severe last PM, but he denies pain now; the pt had 2 episodes of watery diarrhea 2400 and 1230 04/18/2019; the pt says the episodes drained his energy; the pt did have 1/3 piece of chocolate cake on 04/17/2019, and this is not usual for him; recommendations made per nurse triage protocol; they verbalized understanding;the pt sees Dr [name], [facility]; will route to office for notification.  Reason for Disposition . [1] MILD-MODERATE pain AND [2] comes and goes (cramps)  Answer Assessment - Initial Assessment Questions 1. LOCATION: "Where does it hurt?"      Lower abdomin 2. RADIATION: "Does the pain shoot anywhere else?" (e.g., chest, back)    no 3. ONSET: "When did the pain begin?" (Minutes, hours or days ago)     hours 4. SUDDEN: "Gradual or sudden onset?"     gradual 5. PATTERN "Does the pain come and go, or is it constant?"    - If constant: "Is it getting better, staying the same, or worsening?"      (Note: Constant means the pain never goes away completely; most serious pain is constant and it progresses)     - If intermittent: "How long does it last?" "Do you have pain now?"     (Note: Intermittent means the pain goes away completely between bouts)     Constant but now resolved 6. SEVERITY: "How bad is the pain?"  (e.g., Scale 1-10; mild, moderate, or severe)    - MILD (1-3): doesn't interfere with normal activities, abdomen soft and not tender to touch     - MODERATE (4-7): interferes with normal activities or awakens from sleep, tender to touch     - SEVERE (8-10): excruciating pain, doubled over, unable to do any normal activities       Severe last night; but now resolved 7. RECURRENT SYMPTOM: "Have you ever had this type of abdominal pain before?" If so, ask: "When was the last time?"  and "What happened that time?"    Yes, DR [name] started"powdered stuff" 8. CAUSE: "What do you think is causing the abdominal pain?"    The cake and pizza he had last PM 9. RELIEVING/AGGRAVATING FACTORS: "What makes it better or worse?" (e.g., movement, antacids, bowel movement)     no 10. OTHER SYMPTOMS: "Has there been any vomiting, diarrhea, constipation, or urine problems?"       Uncontrollable diarrhea  Protocols used: ABDOMINAL PAIN - MALE-A-AH

## 2019-04-22 NOTE — Telephone Encounter (Signed)
Spoke with David [name] stated symptoms has resolved.

## 2019-04-23 ENCOUNTER — Other Ambulatory Visit: Payer: Self-pay | Admitting: *Deleted

## 2019-04-25 ENCOUNTER — Telehealth: Payer: Self-pay | Admitting: Family Medicine

## 2019-04-25 NOTE — Telephone Encounter (Signed)
See note

## 2019-04-25 NOTE — Telephone Encounter (Signed)
Office closed will try again.

## 2019-04-25 NOTE — Telephone Encounter (Signed)
Copied from [place] [id number]. Topic: General - Other >> Apr 25, 2019  9:22 AM Keene Breath wrote: Reason for CRM: Called to check the status of an order for supplies for the patient that was faxed to office.  Please advise and call to discuss at [phone]

## 2019-04-29 NOTE — Telephone Encounter (Signed)
David [name] from [facility] stated that she was returning the call and would like a callback at [phone]

## 2019-04-29 NOTE — Telephone Encounter (Signed)
See note

## 2019-04-29 NOTE — Telephone Encounter (Signed)
Spoke will [facility] informed that forms were sent to [facility] they will follow up with them

## 2019-04-30 ENCOUNTER — Other Ambulatory Visit: Payer: Self-pay | Admitting: Family Medicine

## 2019-05-02 ENCOUNTER — Other Ambulatory Visit: Payer: Self-pay

## 2019-05-02 ENCOUNTER — Other Ambulatory Visit (INDEPENDENT_AMBULATORY_CARE_PROVIDER_SITE_OTHER): Payer: Medicare Other

## 2019-05-02 DIAGNOSIS — E1165 Type 2 diabetes mellitus with hyperglycemia: Secondary | ICD-10-CM

## 2019-05-02 DIAGNOSIS — Z794 Long term (current) use of insulin: Secondary | ICD-10-CM | POA: Diagnosis not present

## 2019-05-02 LAB — GLUCOSE, RANDOM: Glucose, Bld: 232 mg/dL — ABNORMAL HIGH (ref 70–99)

## 2019-05-03 LAB — FRUCTOSAMINE: Fructosamine: 329 umol/L — ABNORMAL HIGH (ref 0–285)

## 2019-05-07 ENCOUNTER — Encounter: Payer: Self-pay | Admitting: Endocrinology

## 2019-05-07 ENCOUNTER — Ambulatory Visit (INDEPENDENT_AMBULATORY_CARE_PROVIDER_SITE_OTHER): Payer: Medicare Other | Admitting: Endocrinology

## 2019-05-07 ENCOUNTER — Telehealth: Payer: Self-pay | Admitting: Family Medicine

## 2019-05-07 ENCOUNTER — Other Ambulatory Visit: Payer: Self-pay

## 2019-05-07 DIAGNOSIS — E1165 Type 2 diabetes mellitus with hyperglycemia: Secondary | ICD-10-CM | POA: Diagnosis not present

## 2019-05-07 DIAGNOSIS — Z794 Long term (current) use of insulin: Secondary | ICD-10-CM | POA: Diagnosis not present

## 2019-05-07 DIAGNOSIS — I639 Cerebral infarction, unspecified: Secondary | ICD-10-CM | POA: Diagnosis not present

## 2019-05-07 MED ORDER — TRESIBA FLEXTOUCH 200 UNIT/ML ~~LOC~~ SOPN: 44.0000 [IU] | PEN_INJECTOR | Freq: Every day | SUBCUTANEOUS | 1 refills | Status: DC

## 2019-05-07 MED ORDER — NOVOLOG FLEXPEN 100 UNIT/ML ~~LOC~~ SOPN: PEN_INJECTOR | SUBCUTANEOUS | 1 refills | Status: DC

## 2019-05-07 NOTE — Telephone Encounter (Signed)
I left a message asking the patient to call and schedule Medicare AWV with Loma Sousa ([place]).  If patient calls back, please schedule Medicare Wellness Visit at next available opening. Last AWV 05/18/16 VDM ([place]ee[facility]Dee)

## 2019-05-07 NOTE — Progress Notes (Signed)
Patient ID: David [name], male   DOB: 07-Feb-1941, 78 y.o.   MRN: [id number]  I connected with the above-named patient by video enabled telemedicine application and verified that I am speaking with the correct person. The patient was explained the limitations of evaluation and management by telemedicine and the availability of in person appointments.  Patient also understood that there may be a patient responsible charge related to this service . Location of the patient: Patient's home . Location of the provider: Physician office Only the patient and myself were participating in the encounter The patient understood the above statements and agreed to proceed.    Reason for Appointment: Followup for Type 2 Diabetes   History of Present Illness:          Diagnosis: Type 2 diabetes mellitus, date of diagnosis:   1992       Past history:  He was initially treated with metformin and at some point also glipizide. Apparently he was started on insulin in 1994 approximately because of poor control He has been on various insulin regimens over the last several years However even with insulin he has had poor control for at least the last 7 or 8 years. He does not know what his previous A1c levels have been. He had been continued on metformin and glipizide but metformin stopped because of kidney function abnormality He had been taking Lantus 60 units twice a day with NovoLog previously and also Before his initial consultation in 6/15 he was on NovoLog twice a day and Humalog mix insulin  Because of poor control and large insulin doses he was started on Victoza in 6/15  Since 04/28/14 he had been on a Medtronic insulin pump because of persistent poor control and high insulin requirement  Recent history:   Non-insulin hypoglycemic drugs the patient is taking are: VICTOZA 1.58m daily  INSULIN regimen: NovoLog Mix 70/30, 42 units a.m. and 36 units p.m.  Has had A1c's of mostly over 8%,  last 8.3 %  Fructosamine is 323  Current management, problems and blood sugar patterns:  He has been on 70/30 insulin for simplicity and his wife is helping him with his treatment now  His meter has the wrong time programmed and is about 6 hours fast, difficult to analyze his pattern with this  As before he is only checking blood sugars before breakfast and dinnertime at about 10 AM and 6 PM and not after meals  Blood sugars are HIGHER in the mornings although inconsistent  Also has readings as high as 293 at dinnertime; sometimes will have Glucerna for lunch otherwise a meal  Recently has had one low blood sugar of 60 late at night  Also has 1 blood sugar of 87 around lunchtime  Appetite is normal and no recent change in weight apparent  His wife says that he may not always be consistent with diet and his children bring in sweets periodically Average blood sugar again nearly 200 overall    Currently using Contour meter   PRE-MEAL Fasting Lunch Dinner Bedtime Overall  Glucose range:  70-316  87  124-193    Mean/median:  190   220   187   Previous readings:  PRE-MEAL Fasting Lunch Dinner Bedtime Overall  Glucose range:  89-256   112-286    Mean/median:      197   POST-MEAL PC Breakfast PC Lunch PC Dinner  Glucose range:    71-186  Mean/median:  Self-care:   Meals: 2- 3 meals per day.   breakfast is cheerios or egg and toast.  Will have half sandwich with soup at 1 pm , dinner at  6-7 pm.     Bedtime snack is usually crackers  with milk or have been admitted sandwich         Exercise:  Walking occasionally and exercising including on elliptical for up to 60 minutes, twice a week at the gym  Last  consultation : Most recent: 08/2017      Wt Readings from Last 3 Encounters:  04/03/19 278 lb (126.1 kg)  03/06/19 275 lb (124.7 kg)  03/05/19 277 lb 12.8 oz (126 kg)   Glycemic control:   Lab Results  Component Value Date   HGBA1C 8.3 (H) 03/01/2019   HGBA1C  7.4 (H) 01/06/2019   HGBA1C 9.0 (H) 10/23/2018   Lab Results  Component Value Date   MICROALBUR 7.3 (H) 03/05/2019   LDLCALC 73 01/06/2019   CREATININE 2.23 (H) 03/01/2019            Lab Results  Component Value Date   FRUCTOSAMINE 329 (H) 05/02/2019   FRUCTOSAMINE 295 (H) 12/28/2018   FRUCTOSAMINE 316 (H) 07/17/2018   FRUCTOSAMINE 319 (H) 12/27/2017     Other active problems: See review of systems   Allergies as of 05/07/2019      Reactions   Ativan [lorazepam] Anxiety   Pt gets more agitated   Adhesive [tape] Other (See Comments)   blisters      Medication List       Accurate as of May 07, 2019  2:06 PM. If you have any questions, ask your nurse or doctor.        acetaminophen 325 MG tablet Commonly known as: TYLENOL Take 1-2 tablets (325-650 mg total) by mouth every 4 (four) hours as needed for mild pain.   amLODipine 5 MG tablet Commonly known as: NORVASC Take 1 tablet (5 mg total) by mouth daily.   atorvastatin 10 MG tablet Commonly known as: LIPITOR TAKE 1 TABLET BY MOUTH ONCE DAILY AT  6PM   blood glucose meter kit and supplies Kit Dispense based on patient and insurance preference. Use up to four times daily as directed. (FOR ICD-9 250.00, 250.01).   calcitRIOL 0.25 MCG capsule Commonly known as: ROCALTROL Take 1 capsule by mouth once daily   carvedilol 25 MG tablet Commonly known as: COREG TAKE 1 TABLET BY MOUTH TWICE DAILY WITH A MEAL   cholecalciferol 1000 units tablet Commonly known as: VITAMIN D Take 1 tablet (1,000 Units total) by mouth daily.   cholestyramine light 4 g packet Commonly known as: Prevalite Take 1 packet (4 g total) by mouth 2 (two) times daily.   clopidogrel 75 MG tablet Commonly known as: PLAVIX Take 1 tablet by mouth once daily   diclofenac sodium 1 % Gel Commonly known as: VOLTAREN Apply 2 g topically 4 (four) times daily.   Fish Oil 1000 MG Caps Take 1 capsule (1,000 mg total) by mouth every morning.     glucose blood test strip Use Contour test strips as instructed to check blood sugar four times daily.   hydrALAZINE 100 MG tablet Commonly known as: APRESOLINE Take 100 mg by mouth 3 (three) times daily. Take 1 tablet by mouth three times daily.   INSULIN SYRINGE 1CC/31GX5/16" 31G X 5/16" 1 ML Misc 1 each by Does not apply route 3 (three) times daily. Use insulin syringe to inject insulin three  times daily.   isosorbide mononitrate 30 MG 24 hr tablet Commonly known as: IMDUR Take 2 tablets by mouth once daily   NovoLOG FlexPen 100 UNIT/ML FlexPen Generic drug: insulin aspart 10 units before breakfast, 6 before lunch and 12 before dinner Started by: Elayne Snare, MD   NovoLOG Mix 70/30 FlexPen (70-30) 100 UNIT/ML FlexPen Generic drug: insulin aspart protamine - aspart Inject 40 units under the skin twice daily. DX:E11.65   pantoprazole 40 MG tablet Commonly known as: PROTONIX Take 1 tablet (40 mg total) by mouth daily.   sertraline 100 MG tablet Commonly known as: ZOLOFT Take 100 mg by mouth daily.   torsemide 20 MG tablet Commonly known as: DEMADEX Take 2 tablets (5m) by mouth every OTHER day.   TTyler AasFlexTouch 200 UNIT/ML Sopn Generic drug: Insulin Degludec Inject 44 Units into the skin daily. Started by: AElayne Snare MD   Victoza 18 MG/3ML Sopn Generic drug: liraglutide INJECT 0.3ML (1.8MG TOTAL) INTO THE SKIN DAILY BEFORE SUPPER   vitamin B-12 100 MCG tablet Commonly known as: CYANOCOBALAMIN Take 100 mcg by mouth daily.       Allergies:  Allergies  Allergen Reactions  . Ativan [Lorazepam] Anxiety    Pt gets more agitated  . Adhesive [Tape] Other (See Comments)    blisters    Past Medical History:  Diagnosis Date  . Acute cholecystitis s/p lap cholecystectomy 03/05/2017 03/04/2017  . Anemia, iron deficiency   . Anxiety   . Arthritis   . Atrial fibrillation with rapid ventricular response ([facility]   . BPH (benign prostatic hypertrophy)   . CAD  (coronary artery disease)    Nonobstructive CAD per cath  . Cardiac pacemaker in situ   . CHF (congestive heart failure) ([facility]   . Chronic ulcer of right foot ([facility]   . CKD (chronic kidney disease) stage 4, GFR 15-29 ml/min (HCC) 09/26/2017  . 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 Coronary artery disease involving native coronary artery of native heart without angina pectoris   . Diastolic dysfunction   . Dyspnea   . 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 History of cellulitis    right great toe 10-25-2014  . History of skin cancer   . HOCM (hypertrophic obstructive cardiomyopathy) ([facility]   . Hyperlipidemia associated with type 2 diabetes mellitus ([facility] 10/25/2013  . Hypertension   . Hypertension associated with diabetes ([facility] 10/25/2013  . 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 Insulin dependent type 2 diabetes mellitus ([facility] 1991   followd by dr kDwyane Dee-  has insulin pump  . Insulin pump in place   . OSA on CPAP   . 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 Peripheral neuropathy    severe  . Peripheral vascular disease ([facility]    bilateral lower extremities  . PVD (peripheral vascular disease) ([facility]   . Rib fracture 07/24/2015  . Seasonal and perennial allergic rhinitis 10/25/2013  . Seborrheic keratosis 09/19/2016  . Secondary hyperparathyroidism of renal origin ([facility]   . 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 Sinus node dysfunction (HCC)   . Sleep apnea   . Syncope 07/24/2015    Past Surgical History:  Procedure Laterality Date  . 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 AMPUTATION TOE Right 03/12/2015   Procedure: RIGHT HALLUS AMPUTATION ;  Surgeon: NFrancee Piccolo MD;  Location: [facility];  Service: Podiatry;  Laterality: Right;  . CARDIAC CATHETERIZATION  11-25-2010   [place], [place]   Nonobstructive CAD  .  CARDIAC PACEMAKER PLACEMENT  Nov 2009   Medtronic  . CHOLECYSTECTOMY N/A 03/05/2017   Procedure: LAPAROSCOPIC CHOLECYSTECTOMY WITH INTRAOPERATIVE CHOLANGIOGRAM;  Surgeon: [name], MD;  Location[facility];  Service: General;  Laterality: N/A;  . EP IMPLANTABLE DEVICE N/A 06/03/2015   Procedure: PPM Generator Changeout;  Surgeon: [name], MD;  Location: [facility];  Service: Cardiovascular;  Laterality: N/A;  . EXCISION BONE CYST Right 03/06/2015   Procedure: BONE BIOPSIES OF RIGHT FOOT;  Surgeon: [name], MD;  Location: [facility] [place];  Service: Podiatry;  Laterality: Right;  . ORIF ANKLE FRACTURE Left 11/06/2014   Procedure: OPEN REDUCTION INTERNAL FIXATION (ORIF) LEFT  ANKLE FRACTURE;  Surgeon: [name], MD;  Location: [place];  Service: Orthopedics;  Laterality: Left;  . TEE WITHOUT CARDIOVERSION N/A 09/01/2017   Procedure: TRANSESOPHAGEAL ECHOCARDIOGRAM (TEE);  Surgeon: [name], MD;  Location: [place];  Service: Cardiovascular;  Laterality: N/A;  . TOTAL KNEE ARTHROPLASTY    . VEIN LIGATION AND STRIPPING      Family History  Problem Relation Age of Onset  . Cancer Mother        breast  . Heart attack Father   . Stroke Father     Social History:  reports that he quit smoking about 35 years ago. He has a 60.00 pack-year smoking history. He has never used smokeless tobacco. He reports current alcohol use of about 1.0 standard drinks of alcohol per week. He reports that he does not use drugs.    Review of Systems         Lipids: Has been on and off Lipitor since late 2014 and is now taking 10 mg. Previously had taken Gembrozil for several years without apparent side effects.   LDL is below 70 Previously had high ALT with Lipitor  HDL is consistently low  He is taking  fish oil OTC Also taking cholestyramine for other reasons   Lab Results  Component Value Date   CHOL 108 03/01/2019   CHOL 138 01/06/2019   CHOL  122 07/17/2018   Lab Results  Component Value Date   HDL 22.10 (L) 03/01/2019   HDL 23 (L) 01/06/2019   HDL 25.80 (L) 07/17/2018   Lab Results  Component Value Date   LDLCALC 73 01/06/2019   LDLCALC 62 07/17/2018   LDLCALC 106 (H) 08/31/2017   Lab Results  Component Value Date   TRIG 255.0 (H) 03/01/2019   TRIG 210 (H) 01/06/2019   TRIG 168.0 (H) 07/17/2018   Lab Results  Component Value Date   CHOLHDL 5 03/01/2019   CHOLHDL 6.0 01/06/2019   CHOLHDL 5 07/17/2018   Lab Results  Component Value Date   LDLDIRECT 37.0 03/01/2019   LDLDIRECT 50.0 12/27/2017   LDLDIRECT 76.0 07/21/2017    Lab Results  Component Value Date   ALT 14 03/01/2019       HYPERTENSION: The blood pressure has been treated with various drugs including carvedilol 25 mg by his other physicians.    BP Readings from Last 3 Encounters:  04/03/19 (!) 108/38  03/06/19 (!) 141/60  03/05/19 (!) 124/42     Chronic kidney disease: Has variable results, is followed by nephrologist  Creatinine ranges:  Lab Results  Component Value Date   CREATININE 2.23 (H) 03/01/2019  CREATININE 1.89 (H) 01/12/2019   CREATININE 2.25 (H) 01/11/2019         Has history of Numbness in his feet  since about 2013  He has been wearing diabetic shoes He is  under the care of a podiatrist on a regular basis  Vitamin B12 deficiency: Well managed with monthly injections  Lab Results  Component Value Date   VITAMINB12 500 03/06/2019       Physical Examination:  There were no vitals taken for this visit.    ASSESSMENT:  Diabetes type 2, uncontrolled with obesity See history of present illness for detailed discussion of current diabetes management, blood sugar patterns and problems identified   His basal insulin is now NovoLog mix 70/30  A1c is last 8.3 and fructosamine is 323  Although his blood sugar may be adequate for his age and comorbid conditions he is having marked fluctuation in his blood  sugars ranging from 63 up to 316 with premixed insulin Also on Victoza which he is taking regularly  Currently also only using fingersticks for glucose monitoring instead of CGM that he had used before for simplicity   PLAN:   Since he is mostly eating 2 meals a day and should be able to manage his sugars better with taking mealtime insulin twice a day mostly and a basal insulin in the evening at dinnertime His wife agrees to help him with his new insulin regimen Details were sent on my chart message with the dosage is as follows  Also discussed adjusting the doses based on his blood sugar patterns both fasting and postprandial for the basal and mealtime doses respectively His daughter-in-law will also help him with his insulin adjustment He needs to program his meter to the proper date and time also Discussed timing of blood sugar monitoring including rotating readings after meals also especially at night Recommended that he watch his diet better and avoid sweets being brought in   He will stop the NovoLog 70/30 insulin and start of on Tresiba and NovoLog as follows   TRESIBA will be taken at dinnertime starting with 40 units.  After every 4 days increase the insulin by 4 units until the MORNING blood sugar is below 150 If morning sugars are below 100 reduce the dose by 4 units   NOVOLOG insulin: This will be taken at the start of each meal Skip the insulin at lunch if having only Glucerna  Start taking 10 units before breakfast and 12 units before suppertime Take 6 units before lunch if eating any carbohydrate  Periodically check the blood sugars 2 to 3 hours after dinner and if they are mostly over 200 increase the suppertime dose by 2 units progressively Also his blood sugars are below 100 either after breakfast or dinner reduce the dose by 2 units   Patient Instructions  Stop the NovoLog 70/30 insulin and start of this [place] and NovoLog as follows  TRESIBA is in place  of LANTUS insulin and has better 24-hour action  TRESIBA will be taken at dinnertime starting with 40 units.  After every 4 days increase the insulin by 4 units until the MORNING blood sugar is below 150 If morning sugars are below 100 reduce the dose by 4 units   NOVOLOG insulin: This will be taken at the start of each meal Skip the insulin at lunch if having only Glucerna  Start taking 10 units before breakfast and 12 units before suppertime Take 6 units before lunch if eating  any carbohydrate  Periodically check the blood sugars 2 to 3 hours after dinner and if they are mostly over 200 increase the suppertime dose by 2 units progressively Also his blood sugars are below 100 either after breakfast or dinner reduce the dose by 2 units    Counseling time on subjects discussed in assessment and plan sections is over 50% of today's 25 minute visit   Elayne Snare 05/07/2019, 2:06 PM   Note: This office note was prepared with Dragon voice recognition system technology. Any transcriptional errors that result from this process are unintentional.

## 2019-05-07 NOTE — Patient Instructions (Addendum)
Stop the NovoLog 70/30 insulin and start of this [place] and NovoLog as follows  TRESIBA is in place of LANTUS insulin and has better 24-hour action  TRESIBA will be taken at dinnertime starting with 40 units.  After every 4 days increase the insulin by 4 units until the MORNING blood sugar is below 150 If morning sugars are below 100 reduce the dose by 4 units   NOVOLOG insulin: This will be taken at the start of each meal Skip the insulin at lunch if having only Glucerna  Start taking 10 units before breakfast and 12 units before suppertime Take 6 units before lunch if eating any carbohydrate  Periodically check the blood sugars 2 to 3 hours after dinner and if they are mostly over 200 increase the suppertime dose by 2 units progressively Also his blood sugars are below 100 either after breakfast or dinner reduce the dose by 2 units

## 2019-05-13 ENCOUNTER — Encounter: Payer: Self-pay | Admitting: Family Medicine

## 2019-05-13 ENCOUNTER — Encounter: Payer: Self-pay | Admitting: *Deleted

## 2019-05-13 ENCOUNTER — Other Ambulatory Visit: Payer: Self-pay | Admitting: *Deleted

## 2019-05-13 NOTE — Patient Outreach (Signed)
[facility]) Care Management  [place]  05/13/2019   BRACK SHADDOCK [birth year] [id number]   [name] Initial Assessment  Referral Date:  04/15/2019 Referral Source:  Transfer from [place] Reason for Referral:  Continued Disease Management Education Insurance:  Medicare   Outreach Attempt:  Successful telephone outreach to patient's wife, [name].  HIPAA verified with [name], Release of Information on file.  Wife states patient sleeping.  [name] introduced self and role.  Wife verbally agrees to Disease Management outreaches.  Completed initial telephone assessment.  Social:  Patient lives at home with wife.  Wife assist with ADLs and wife and other family completes IADLs.  Patient is becoming increasingly confused and deconditioned.  Ambulates with cane mostly and walker at times.  Reports about 3 falls in the last year.  Last fall was this past Saturday, December 26, when patient fell out of bed.  Fall precautions and preventions reviewed and discussed with wife.  Wife transports to medical appointments.  DME in the home include:  Quad cane, Rolator walker, upper and lower dentures, bilateral hearing aids, blood pressure cuff, CBG meter, shower chair with back, eyeglasses, grab bar in shower and around toilet, scale, wheelchair, CPAP, and son has ordered bedside rail.  Wife is reporting patient having increasing trouble getting out of bed.  Conditions:  Per chart review and discussion with wife, PMH includes but not limited CL:EXNTZG, anxiety, arthritis, atrial fibrillation, benign prostatic hypertrophy, coronary artery disease, pacemaker placement, chronic kidney disease, CVA, skin cancer, hypertrophic obstructive cardiomyopathy, hyperlipidemia, hypertension, diabetes, obstructive sleep apnea, peripheral neuropathy, peripheral vascular disease, toe amputations, and bilateral cataracts removed.  Wife reporting patient has had a decreased  in appetite over the past month of so.  States he has gotten increasingly weaker and was some short of breath over the past week, with a fall out of bed this past Saturday.  Neighbors assisted him off the floor.  Does state that he looked a little better this morning as he was able to walk to dining room table and eat breakfast and shortness of breath was less noticeable.  States he had a virtual annual wellness visit yesterday and they were going to try and send message to primary care provider.  [name] encouraged wife to contact primary care provider for appointment if she has not gotten an outreach from provider in the next few days.  Monitors blood pressure at least daily with ranges on 130-140/60's.  Monitors blood sugars about 3-4 times a day.  Fasting blood sugar this morning was 178 and wife reporting this is trending down ans previous ranges were above 200's with recent change in insulins.  Does continue to report post prandial ranges are in 200's.  Wife reports insulins were just changed and she is able to recite how to titrate insulins per Dr. Ronnie Derby instructions.  Medications:  Reports taking about 15 medications daily.  Wife states she manages medications with weekly pill box fills and dispenses the medications daily.  Denies any current issues with affording medications.  Encounter Medications:  Outpatient Encounter Medications as of 05/13/2019  Medication Sig Note  . acetaminophen (TYLENOL) 325 MG tablet Take 1-2 tablets (325-650 mg total) by mouth every 4 (four) hours as needed for mild pain.   Marland Kitchen amLODipine (NORVASC) 5 MG tablet Take 1 tablet (5 mg total) by mouth daily.   Marland Kitchen atorvastatin (LIPITOR) 10 MG tablet TAKE 1 TABLET BY MOUTH ONCE DAILY AT  6PM   . blood glucose meter kit and supplies KIT Dispense based on patient and insurance preference. Use up to four times daily as directed. (FOR ICD-9 250.00, 250.01).   . calcitRIOL (ROCALTROL) 0.25 MCG capsule Take 1 capsule by mouth  once daily   . carvedilol (COREG) 25 MG tablet TAKE 1 TABLET BY MOUTH TWICE DAILY WITH A MEAL   . cholecalciferol (VITAMIN D) 1000 units tablet Take 1 tablet (1,000 Units total) by mouth daily.   . cholestyramine light (PREVALITE) 4 g packet Take 1 packet (4 g total) by mouth 2 (two) times daily. 05/13/2019: Reports taking once a day  . clopidogrel (PLAVIX) 75 MG tablet Take 1 tablet by mouth once daily   . folic acid (FOLVITE) 1 MG tablet Take 1 mg by mouth daily.   . hydrALAZINE (APRESOLINE) 100 MG tablet Take 100 mg by mouth 3 (three) times daily. Take 1 tablet by mouth three times daily. 05/13/2019: Reports taking twice a day  . insulin aspart (NOVOLOG FLEXPEN) 100 UNIT/ML FlexPen 10 units before breakfast, 6 before lunch and 12 before dinner   . Insulin Degludec (TRESIBA FLEXTOUCH) 200 UNIT/ML SOPN Inject 44 Units into the skin daily.   . isosorbide mononitrate (IMDUR) 30 MG 24 hr tablet Take 2 tablets by mouth once daily   . Omega-3 Fatty Acids (FISH OIL) 1000 MG CAPS Take 1 capsule (1,000 mg total) by mouth every morning.   . pantoprazole (PROTONIX) 40 MG tablet Take 1 tablet (40 mg total) by mouth daily.   . sertraline (ZOLOFT) 100 MG tablet Take 100 mg by mouth daily.   Marland Kitchen torsemide (DEMADEX) 20 MG tablet Take 2 tablets (48m) by mouth every OTHER day.   .Marland KitchenVICTOZA 18 MG/3ML SOPN INJECT 0.3ML (1.8MG TOTAL) INTO THE SKIN DAILY BEFORE SUPPER   . vitamin B-12 (CYANOCOBALAMIN) 100 MCG tablet Take 100 mcg by mouth daily.   . diclofenac sodium (VOLTAREN) 1 % GEL Apply 2 g topically 4 (four) times daily. (Patient not taking: Reported on 05/13/2019)   . glucose blood test strip Use Contour test strips as instructed to check blood sugar four times daily.   . Insulin Syringe-Needle U-100 (INSULIN SYRINGE 1CC/31GX5/16") 31G X 5/16" 1 ML MISC 1 each by Does not apply route 3 (three) times daily. Use insulin syringe to inject insulin three times daily.   .Marland KitchenNOVOLOG MIX 70/30 FLEXPEN (70-30) 100 UNIT/ML  FlexPen Inject 40 units under the skin twice daily. DX:E11.65 (Patient not taking: Reported on 05/13/2019) 05/13/2019: Reports no longer taking   No facility-administered encounter medications on file as of 05/13/2019.    Functional Status:  In your present state of health, do you have any difficulty performing the following activities: 05/13/2019 01/25/2019  Hearing? Y -  Comment bilateral hearing aids -  Vision? Y -  Comment retinopathy treatments -  Difficulty concentrating or making decisions? Y -  Comment trouble remembering, mild dementia -  Walking or climbing stairs? Y -  Comment frequent falls, trouble climbing stairs -  Dressing or bathing? Y -  Comment wife assist with bathing and dressing -  Doing errands, shopping? Y -  Comment wife and family assist -  PConservation officer, natureand eating ? Y -  Comment wife and family cooks -  Using the Toilet? Y -  Comment wife assist to bathroom -  In the past six months, have you accidently leaked urine? Y Y  Comment urinary incontinence "Occasional" per caregiver report: patient wears depends "just in  case"  Do you have problems with loss of bowel control? Y Y  Comment trouble controlling bowels "Occasional" per caregiver report; patient wears depends  Managing your Medications? Y -  Comment wife manages medications -  Managing your Finances? Y -  Comment wife and family pays bills -  Housekeeping or managing your Housekeeping? Y -  Comment wife and family cleans -  Some recent data might be hidden    Fall/Depression Screening: Fall Risk  05/13/2019 04/15/2019 03/28/2019  Falls in the past year? 1 (No Data) 1  Comment - Falls screening previously completed- caregiver denies new/ recent falls caregiver reports another fall- see narrative  Number falls in past yr: 1 - 1  Comment - - -  Injury with Fall? 0 - 0  Comment - - -  Risk for fall due to : History of fall(s);Medication side effect;Impaired balance/gait;Impaired  mobility;Impaired vision - History of fall(s);Impaired balance/gait;Impaired mobility;Medication side effect;Other (Comment)  Risk for fall due to: Comment - - residual weakness post- CVA  Follow up Falls evaluation completed;Education provided;Falls prevention discussed Falls prevention discussed Education provided;Falls prevention discussed   PHQ 2/9 Scores 05/13/2019 04/03/2019 01/18/2019 01/14/2019 11/19/2018 09/25/2017 08/17/2017  PHQ - 2 Score - 0 1 1 0 0 0  PHQ- 9 Score - - - - - 1 -  Exception Documentation Other- indicate reason in comment box - - Other- indicate reason in comment box - - -  Not completed did not speak with patient, spoke with wife - - Spouse states patrient is being worked up for dementia and has memory issues - - -   THN CM Care Plan Problem One     Most Recent Value  Care Plan Problem One  Trouble managing diabetes with decreased appetite in patient and increasing confusion  Role Documenting the Problem One  [place] for Problem One  Active  THN Long Term Goal   Wife will report decrease in Hgb A1C by 0.2 points within the next 90 days.  THN Long Term Goal Start Date  05/13/19  Interventions for Problem One Long Term Goal  Care plan and goals reviewed and discussed with wife, reviewed medications and indications and encouraged medication compliance, reviewed current insulin orders and titration guidelines with wife and encouraged her to contact provider with questions or concerns, reviewed signs and symptoms of hyper and hypoglycemia, encouraged heart healthy low salt diet, encouraged to keep and attend scheduled appointments, fall precautions and preventions reviewed and discussed, encouraged wife to contact primary care provider for appointment related to patients decrease in appetite and shortness of breath     Appointments:  Reports last attending appointment with primary care provider, Dr. [name] on 02/18/2019.  Attended appointment with Dr. Dwyane Dee,  Endocrinologist on 05/07/2019 and has follow up scheduled on 06/17/2019.  Last appointment with Cardiology on 04/03/2019.  Encouraged wife to arrange follow up appointment with primary care as soon as possible.  Advanced Directives:  Reports having Living Will and [place] in place and does not wish to make changes at this time.   Consent:  [facility] services reviewed and discussed.  Wife verbally agrees to Disease Management outreaches.  Plan: [name] will send primary MD barriers letter. [name] will route initial telephone assessment note to primary MD. [place] will make next telephone outreach to patient and patient's wife within the month of February and wife agrees to future outreach.  [place]  Management  [name] [phone] [email or web address]

## 2019-05-14 ENCOUNTER — Ambulatory Visit (INDEPENDENT_AMBULATORY_CARE_PROVIDER_SITE_OTHER): Payer: Medicare Other | Admitting: Family Medicine

## 2019-05-14 DIAGNOSIS — R5383 Other fatigue: Secondary | ICD-10-CM

## 2019-05-14 NOTE — Progress Notes (Signed)
   Chief Complaint:  David [name] is a 78 y.o. male who presents today for a virtual office visit with a chief complaint of fatigue   Assessment/Plan:  Fatigue Likely secondary to anemia and worsening kidney function.  Discussed limitations of virtual visit and inability to perform physical exam or lab testing today.  He has had no acute distress and appears well on video visit-do not think he needs emergent evaluation at this point.  Advised him to schedule appointment with nephrology-apparently they were discussing EPO injections at his last visit.  If he is not able to be seen by nephrology this week we will have him come into our office for labs including CBC and CMET.    Subjective:  HPI:  Fatigue Symptoms have worsened within the last few weeks.  He has having decreased energy decreased exercise tolerance and decreased appetite.  Lost about 25 pounds over the last several weeks.  He also feels excessively thirsty.  Reportedly saw his nephrologist a couple of weeks ago and was found to have a decreased blood count and elevated creatinine.  Patient's wife does not remember the exact blood count.  He is sleeping for most of the day.  ROS: Per HPI  PMH: He reports that he quit smoking about 35 years ago. He has a 60.00 pack-year smoking history. He has never used smokeless tobacco. He reports current alcohol use of about 1.0 standard drinks of alcohol per week. He reports that he does not use drugs.      Objective/Observations  Physical Exam: Wt Readings from Last 3 Encounters:  04/03/19 278 lb (126.1 kg)  03/06/19 275 lb (124.7 kg)  03/05/19 277 lb 12.8 oz (126 kg)    Gen: NAD, resting comfortably Pulm: Normal work of breathing Neuro: Grossly normal, moves all extremities Psych: Normal affect and thought content  No results found for this or any previous visit (from the past 24 hour(s)).   Virtual Visit via Video   I connected with David [name] on 05/14/19 at 11:00 AM  EST by a video enabled telemedicine application and verified that I am speaking with the correct person using two identifiers. The limitations of evaluation and management by telemedicine and the availability of in person appointments were discussed. The patient expressed understanding and agreed to proceed.   Patient location: Home Provider location: [place] participating in the virtual visit: Myself and Patient     [name], MD 05/14/2019 11:17 AM

## 2019-05-18 ENCOUNTER — Other Ambulatory Visit: Payer: Self-pay | Admitting: Family Medicine

## 2019-05-20 ENCOUNTER — Other Ambulatory Visit: Payer: Self-pay | Admitting: Endocrinology

## 2019-05-21 NOTE — Telephone Encounter (Signed)
He already has on med list - please make sure that he is not taking it twice.

## 2019-05-21 NOTE — Telephone Encounter (Signed)
Please advise if patient should be taking Rx

## 2019-05-23 ENCOUNTER — Other Ambulatory Visit: Payer: Self-pay

## 2019-05-23 ENCOUNTER — Other Ambulatory Visit: Payer: Self-pay | Admitting: Family Medicine

## 2019-05-23 ENCOUNTER — Encounter: Payer: Self-pay | Admitting: Family Medicine

## 2019-05-23 MED ORDER — HYDRALAZINE HCL 100 MG PO TABS: 100.0000 mg | ORAL_TABLET | Freq: Two times a day (BID) | ORAL | 1 refills | Status: DC

## 2019-05-23 MED ORDER — HYDRALAZINE HCL 100 MG PO TABS: 100.0000 mg | ORAL_TABLET | Freq: Two times a day (BID) | ORAL | 0 refills | Status: DC

## 2019-05-23 NOTE — Telephone Encounter (Signed)
Please make sure patient is taking proper dose of sertraline - have gotten multiple rx fills for 50mg  dose.

## 2019-05-23 NOTE — Telephone Encounter (Signed)
Last OV 05/14/19 Last refill - not on current med list, Sertraline 100 mg is on med list but not prescribed by Dr. [name] Next OV not scheduled

## 2019-05-24 ENCOUNTER — Other Ambulatory Visit: Payer: Self-pay

## 2019-05-24 MED ORDER — SERTRALINE HCL 100 MG PO TABS: 100.0000 mg | ORAL_TABLET | Freq: Every day | ORAL | 1 refills | Status: DC

## 2019-05-28 ENCOUNTER — Other Ambulatory Visit (HOSPITAL_COMMUNITY): Payer: Self-pay | Admitting: *Deleted

## 2019-05-29 ENCOUNTER — Encounter (HOSPITAL_COMMUNITY)
Admission: RE | Admit: 2019-05-29 | Discharge: 2019-05-29 | Disposition: A | Discharge status: Home / Self Care | Payer: Medicare Other | Source: Ambulatory Visit | Attending: Nephrology | Admitting: Nephrology

## 2019-05-29 DIAGNOSIS — D631 Anemia in chronic kidney disease: Secondary | ICD-10-CM | POA: Diagnosis not present

## 2019-05-29 DIAGNOSIS — N189 Chronic kidney disease, unspecified: Secondary | ICD-10-CM | POA: Insufficient documentation

## 2019-05-29 MED ORDER — SODIUM CHLORIDE 0.9 % IV SOLN
510.0000 mg | INTRAVENOUS | Status: DC
  Administered 2019-05-29: 510 mg via INTRAVENOUS
  Filled 2019-05-29: qty 510

## 2019-05-29 NOTE — Discharge Instructions (Signed)

## 2019-05-30 ENCOUNTER — Ambulatory Visit: Payer: Self-pay | Admitting: Neurology

## 2019-05-30 ENCOUNTER — Telehealth: Payer: Self-pay

## 2019-05-30 NOTE — Telephone Encounter (Signed)
PT cancel appt same day not feeling well.

## 2019-06-03 ENCOUNTER — Ambulatory Visit (INDEPENDENT_AMBULATORY_CARE_PROVIDER_SITE_OTHER): Payer: Medicare Other | Admitting: *Deleted

## 2019-06-03 ENCOUNTER — Ambulatory Visit: Payer: Medicare Other | Admitting: *Deleted

## 2019-06-03 DIAGNOSIS — I421 Obstructive hypertrophic cardiomyopathy: Secondary | ICD-10-CM

## 2019-06-04 LAB — CUP PACEART REMOTE DEVICE CHECK
Battery Remaining Longevity: 52 mo
Battery Voltage: 2.99 V
Brady Statistic AP VP Percent: 0.05 %
Brady Statistic AP VS Percent: 96.87 %
Brady Statistic AS VP Percent: 0 %
Brady Statistic AS VS Percent: 3.08 %
Brady Statistic RA Percent Paced: 96.87 %
Brady Statistic RV Percent Paced: 0.06 %
Date Time Interrogation Session: 20210119112809
Implantable Lead Implant Date: 20091102
Implantable Lead Implant Date: 20091102
Implantable Lead Location: 753859
Implantable Lead Location: 753860
Implantable Lead Model: 5076
Implantable Lead Model: 5076
Implantable Pulse Generator Implant Date: 20170118
Lead Channel Impedance Value: 1235 Ohm
Lead Channel Impedance Value: 399 Ohm
Lead Channel Impedance Value: 399 Ohm
Lead Channel Impedance Value: 513 Ohm
Lead Channel Pacing Threshold Amplitude: 2.125 V
Lead Channel Pacing Threshold Amplitude: 2.25 V
Lead Channel Pacing Threshold Pulse Width: 0.4 ms
Lead Channel Pacing Threshold Pulse Width: 0.4 ms
Lead Channel Sensing Intrinsic Amplitude: 18.25 mV
Lead Channel Sensing Intrinsic Amplitude: 18.25 mV
Lead Channel Sensing Intrinsic Amplitude: 2 mV
Lead Channel Sensing Intrinsic Amplitude: 2 mV
Lead Channel Setting Pacing Amplitude: 2.5 V
Lead Channel Setting Pacing Amplitude: 4.25 V
Lead Channel Setting Pacing Pulse Width: 0.4 ms
Lead Channel Setting Sensing Sensitivity: 2.8 mV

## 2019-06-05 ENCOUNTER — Other Ambulatory Visit: Payer: Self-pay

## 2019-06-05 ENCOUNTER — Encounter (HOSPITAL_COMMUNITY)
Admission: RE | Admit: 2019-06-05 | Discharge: 2019-06-05 | Disposition: A | Discharge status: Home / Self Care | Payer: Medicare Other | Source: Ambulatory Visit | Attending: Nephrology | Admitting: Nephrology

## 2019-06-05 DIAGNOSIS — D631 Anemia in chronic kidney disease: Secondary | ICD-10-CM | POA: Diagnosis not present

## 2019-06-05 DIAGNOSIS — N189 Chronic kidney disease, unspecified: Secondary | ICD-10-CM | POA: Diagnosis not present

## 2019-06-05 MED ORDER — SODIUM CHLORIDE 0.9 % IV SOLN
510.0000 mg | INTRAVENOUS | Status: DC
  Administered 2019-06-05: 510 mg via INTRAVENOUS
  Filled 2019-06-05: qty 510

## 2019-06-11 ENCOUNTER — Other Ambulatory Visit: Payer: Self-pay

## 2019-06-11 ENCOUNTER — Encounter: Payer: Self-pay | Admitting: Podiatry

## 2019-06-11 ENCOUNTER — Ambulatory Visit (INDEPENDENT_AMBULATORY_CARE_PROVIDER_SITE_OTHER): Payer: Medicare Other | Admitting: Podiatry

## 2019-06-11 DIAGNOSIS — E1142 Type 2 diabetes mellitus with diabetic polyneuropathy: Secondary | ICD-10-CM | POA: Diagnosis not present

## 2019-06-11 DIAGNOSIS — L84 Corns and callosities: Secondary | ICD-10-CM

## 2019-06-11 DIAGNOSIS — Z89411 Acquired absence of right great toe: Secondary | ICD-10-CM | POA: Diagnosis not present

## 2019-06-11 DIAGNOSIS — Z89421 Acquired absence of other right toe(s): Secondary | ICD-10-CM | POA: Diagnosis not present

## 2019-06-11 DIAGNOSIS — B351 Tinea unguium: Secondary | ICD-10-CM

## 2019-06-11 NOTE — Patient Instructions (Signed)
Diabetes Mellitus and Foot Care Foot care is an important part of your health, especially when you have diabetes. Diabetes may cause you to have problems because of poor blood flow (circulation) to your feet and legs, which can cause your skin to:  Become thinner and drier.  Break more easily.  Heal more slowly.  Peel and crack. You may also have nerve damage (neuropathy) in your legs and feet, causing decreased feeling in them. This means that you may not notice minor injuries to your feet that could lead to more serious problems. Noticing and addressing any potential problems early is the best way to prevent future foot problems. How to care for your feet Foot hygiene  Wash your feet daily with warm water and mild soap. Do not use hot water. Then, pat your feet and the areas between your toes until they are completely dry. Do not soak your feet as this can dry your skin.  Trim your toenails straight across. Do not dig under them or around the cuticle. File the edges of your nails with an emery board or nail file.  Apply a moisturizing lotion or petroleum jelly to the skin on your feet and to dry, brittle toenails. Use lotion that does not contain alcohol and is unscented. Do not apply lotion between your toes. Shoes and socks  Wear clean socks or stockings every day. Make sure they are not too tight. Do not wear knee-high stockings since they may decrease blood flow to your legs.  Wear shoes that fit properly and have enough cushioning. Always look in your shoes before you put them on to be sure there are no objects inside.  To break in new shoes, wear them for just a few hours a day. This prevents injuries on your feet. Wounds, scrapes, corns, and calluses  Check your feet daily for blisters, cuts, bruises, sores, and redness. If you cannot see the bottom of your feet, use a mirror or ask someone for help.  Do not cut corns or calluses or try to remove them with medicine.  If you  find a minor scrape, cut, or break in the skin on your feet, keep it and the skin around it clean and dry. You may clean these areas with mild soap and water. Do not clean the area with peroxide, alcohol, or iodine.  If you have a wound, scrape, corn, or callus on your foot, look at it several times a day to make sure it is healing and not infected. Check for: ? Redness, swelling, or pain. ? Fluid or blood. ? Warmth. ? Pus or a bad smell. General instructions  Do not cross your legs. This may decrease blood flow to your feet.  Do not use heating pads or hot water bottles on your feet. They may burn your skin. If you have lost feeling in your feet or legs, you may not know this is happening until it is too late.  Protect your feet from hot and cold by wearing shoes, such as at the beach or on hot pavement.  Schedule a complete foot exam at least once a year (annually) or more often if you have foot problems. If you have foot problems, report any cuts, sores, or bruises to your health care provider immediately. Contact a health care provider if:  You have a medical condition that increases your risk of infection and you have any cuts, sores, or bruises on your feet.  You have an injury that is not   healing.  You have redness on your legs or feet.  You feel burning or tingling in your legs or feet.  You have pain or cramps in your legs and feet.  Your legs or feet are numb.  Your feet always feel cold.  You have pain around a toenail. Get help right away if:  You have a wound, scrape, corn, or callus on your foot and: ? You have pain, swelling, or redness that gets worse. ? You have fluid or blood coming from the wound, scrape, corn, or callus. ? Your wound, scrape, corn, or callus feels warm to the touch. ? You have pus or a bad smell coming from the wound, scrape, corn, or callus. ? You have a fever. ? You have a red line going up your leg. Summary  Check your feet every day  for cuts, sores, red spots, swelling, and blisters.  Moisturize feet and legs daily.  Wear shoes that fit properly and have enough cushioning.  If you have foot problems, report any cuts, sores, or bruises to your health care provider immediately.  Schedule a complete foot exam at least once a year (annually) or more often if you have foot problems. This information is not intended to replace advice given to you by your health care provider. Make sure you discuss any questions you have with your health care provider. Document Revised: 01/23/2019 Document Reviewed: 06/03/2016 Elsevier Patient Education  2020 Elsevier Inc.  

## 2019-06-14 ENCOUNTER — Ambulatory Visit: Payer: Medicare Other | Attending: Internal Medicine

## 2019-06-14 DIAGNOSIS — Z20822 Contact with and (suspected) exposure to covid-19: Secondary | ICD-10-CM

## 2019-06-15 LAB — NOVEL CORONAVIRUS, NAA: SARS-CoV-2, NAA: NOT DETECTED

## 2019-06-15 NOTE — Progress Notes (Addendum)
Subjective: David [name] presents today for follow up of at risk foot care. Patient has h/o digital amputation right, 2nd toe and partial hallux amputation right foot.  Wife is accompanying [name] on visit. She states all areas are healed. He still has lesion on distal tip left 3rd digit, but it is now closed. Patient has not had any more episodes of trauma.   Allergies  Allergen Reactions  . Ativan [Lorazepam] Anxiety    Pt gets more agitated  . Adhesive [Tape] Other (See Comments)    blisters     Objective: There were no vitals filed for this visit.  Vascular Examination:  Capillary refill time to remaining digits delayed b/l, faintly palpable DP pulses b/l, non-palpable PT pulses b/l, pedal hair absent b/l and skin temperature gradient within normal limits b/l  Dermatological Examination: Pedal skin is thin shiny, atrophic bilaterally, no open wounds bilaterally, no interdigital macerations bilaterally, toenails 1-5 b/l elongated, dystrophic, thickened, crumbly with subungual debris and hyperkeratotic lesion(s) distal tip left 3rd digit with subdermal hemorrhage. Lesion remains closed.  No erythema, no edema, no drainage, no flocculence   All previous ulcerations healed: right hallux, right 3rd digit, left 3rd digit  Musculoskeletal: Normal muscle strength 5/5 to all lower extremity muscle groups bilaterally, no pain crepitus or joint limitation noted with ROM b/l and digital amputation right, 2nd toe, partial hallux amputation right hallux  Neurological: Protective sensation absent with 10g monofilament b/l and vibratory sensation absent b/l  Assessment: 1. Onychomycosis   2. Pre-ulcerative corn or callous   3. Diabetic peripheral neuropathy associated with type 2 diabetes mellitus (HCC)   4. Status post amputation of lesser toe of right foot ([place])   5. Status post amputation of right great toe ([place])     Plan: -Continue diabetic foot care principles. Literature  dispensed on today.  -Toenails 1-5 b/l were debrided in length and girth without iatrogenic bleeding. -corns and calluses were debrided without complication or incident. Total number debrided =1 distal tip left 3rd digit. Will try toe tunnel left 3rd digit.  -Patient to continue soft, supportive shoe gear daily. -Patient to report any pedal injuries to medical professional immediately. -Patient/POA to call should there be question/concern in the interim.  Return in about 9 weeks (around 08/13/2019) for diabetic nail and callus trim/ Plavix.

## 2019-06-17 ENCOUNTER — Ambulatory Visit (INDEPENDENT_AMBULATORY_CARE_PROVIDER_SITE_OTHER): Payer: Medicare Other | Admitting: Endocrinology

## 2019-06-17 ENCOUNTER — Other Ambulatory Visit: Payer: Self-pay

## 2019-06-17 ENCOUNTER — Encounter: Payer: Self-pay | Admitting: Endocrinology

## 2019-06-17 DIAGNOSIS — E1165 Type 2 diabetes mellitus with hyperglycemia: Secondary | ICD-10-CM

## 2019-06-17 DIAGNOSIS — Z794 Long term (current) use of insulin: Secondary | ICD-10-CM

## 2019-06-17 NOTE — Progress Notes (Signed)
   Patient ID: David [name], male   DOB: 04/07/1941, 78 y.o.   MRN: [id number]  I connected with the above-named patient by video enabled telemedicine application and verified that I am speaking with the correct person. The patient was explained the limitations of evaluation and management by telemedicine and the availability of in person appointments.  Patient also understood that there may be a patient responsible charge related to this service . Location of the patient: Patient's home . Location of the provider: Physician office  the patient, his wife and myself were participating in the encounter The patient understood the above statements and agreed to proceed.    Reason for Appointment: Followup for Type 2 Diabetes   History of Present Illness:          Diagnosis: Type 2 diabetes mellitus, date of diagnosis:   1992       Past history:  He was initially treated with metformin and at some point also glipizide. Apparently he was started on insulin in 1994 approximately because of poor control He has been on various insulin regimens over the last several years However even with insulin he has had poor control for at least the last 7 or 8 years. He does not know what his previous A1c levels have been. He had been continued on metformin and glipizide but metformin stopped because of kidney function abnormality He had been taking Lantus 60 units twice a day with NovoLog previously and also Before his initial consultation in 6/15 he was on NovoLog twice a day and Humalog mix insulin  Because of poor control and large insulin doses he was started on Victoza in 6/15  Since 04/28/14 he had been on a [employer]tronic insulin pump because of persistent poor control and high insulin requirement  Recent history:   Non-insulin hypoglycemic drugs the patient is taking are: VICTOZA 1.8mg daily  INSULIN regimen: Tresiba 48 units daily, NovoLog 10 units at brunch and 12 units at  dinner Previous regimen: NovoLog Mix 70/30, 42 units a.m. and 36 units p.m.  Has had A1c's of mostly over 8%, last 8.3 %  Fructosamine :323  Current management, problems and blood sugar patterns:  He has been on Tresiba and NovoLog since December since blood sugars were variable on 70/30 insulin  He has not been very active and getting up late in the mornings as well as having overall decreased appetite until recently  With this he is eating mostly 2 meals a day  His meter was reprogrammed to the right time on the last visit  Since last visit he has been cutting back on getting sweets and was having blood sugars as high as 316  Although details of his blood sugars and averages are not available his blood sugars are overall relatively better  He is also reducing his total daily dose by at least 14 units compared to premixed insulin  His wife increase the Tresiba to 48 from the starting dose of 44 but in the last several days his morning sugars have been as low as 81 and she is concerned about this  However blood sugars are mostly high normal in the postprandial range after meals usually  Because of fatigue he has not been doing much physical activity  A few blood sugars from his meter recently:  PRE-MEAL Fasting Lunch Dinner Bedtime Overall  Glucose range:  81-118      Mean/median:     ?   POST-MEAL PC Breakfast PC Lunch   PC Dinner  Glucose range:   160-171  118-229  Mean/median:      PREVIOUS readings:   PRE-MEAL Fasting Lunch Dinner Bedtime Overall  Glucose range:  70-316  87  124-193    Mean/median:  190   220   187      Self-care:   Meals: 2- 3 meals per day.   breakfast is cheerios or egg and toast.  Will have half sandwich with soup at 1 pm , dinner at  6-7 pm.     Bedtime snack is usually crackers  with milk or have been admitted sandwich          Last  consultation : Most recent: 08/2017      Wt Readings from Last 3 Encounters:  05/29/19 264 lb (119.7  kg)  04/03/19 278 lb (126.1 kg)  03/06/19 275 lb (124.7 kg)   Glycemic control:   Lab Results  Component Value Date   HGBA1C 8.3 (H) 03/01/2019   HGBA1C 7.4 (H) 01/06/2019   HGBA1C 9.0 (H) 10/23/2018   Lab Results  Component Value Date   MICROALBUR 7.3 (H) 03/05/2019   LDLCALC 73 01/06/2019   CREATININE 2.23 (H) 03/01/2019            Lab Results  Component Value Date   FRUCTOSAMINE 329 (H) 05/02/2019   FRUCTOSAMINE 295 (H) 12/28/2018   FRUCTOSAMINE 316 (H) 07/17/2018   FRUCTOSAMINE 319 (H) 12/27/2017     Other active problems: See review of systems   Allergies as of 06/17/2019      Reactions   Ativan [lorazepam] Anxiety   Pt gets more agitated   Adhesive [tape] Other (See Comments)   blisters      Medication List       Accurate as of June 17, 2019 12:36 PM. If you have any questions, ask your nurse or doctor.        acetaminophen 325 MG tablet Commonly known as: TYLENOL Take 1-2 tablets (325-650 mg total) by mouth every 4 (four) hours as needed for mild pain.   amLODipine 5 MG tablet Commonly known as: NORVASC Take 1 tablet (5 mg total) by mouth daily.   atorvastatin 10 MG tablet Commonly known as: LIPITOR TAKE 1 TABLET BY MOUTH ONCE DAILY AT  6PM   blood glucose meter kit and supplies Kit Dispense based on patient and insurance preference. Use up to four times daily as directed. (FOR ICD-9 250.00, 250.01).   calcitRIOL 0.25 MCG capsule Commonly known as: ROCALTROL Take 1 capsule by mouth once daily   carvedilol 25 MG tablet Commonly known as: COREG TAKE 1 TABLET BY MOUTH TWICE DAILY WITH A MEAL   cholecalciferol 1000 units tablet Commonly known as: VITAMIN D Take 1 tablet (1,000 Units total) by mouth daily.   cholestyramine light 4 g packet Commonly known as: Prevalite Take 1 packet (4 g total) by mouth 2 (two) times daily.   clopidogrel 75 MG tablet Commonly known as: PLAVIX Take 1 tablet by mouth once daily   diclofenac sodium 1 %  Gel Commonly known as: VOLTAREN Apply 2 g topically 4 (four) times daily.   Fish Oil 1000 MG Caps Take 1 capsule (1,000 mg total) by mouth every morning.   folic acid 1 MG tablet Commonly known as: FOLVITE Take 1 mg by mouth daily.   glucose blood test strip Use Contour test strips as instructed to check blood sugar four times daily.   hydrALAZINE 100 MG tablet Commonly known as: APRESOLINE  Take 1 tablet (100 mg total) by mouth 2 (two) times daily.   INSULIN SYRINGE 1CC/31GX5/16" 31G X 5/16" 1 ML Misc 1 each by Does not apply route 3 (three) times daily. Use insulin syringe to inject insulin three times daily.   isosorbide mononitrate 30 MG 24 hr tablet Commonly known as: IMDUR Take 2 tablets by mouth once daily   NovoLOG FlexPen 100 UNIT/ML FlexPen Generic drug: insulin aspart 10 units before breakfast, 6 before lunch and 12 before dinner   NovoLOG Mix 70/30 FlexPen (70-30) 100 UNIT/ML FlexPen Generic drug: insulin aspart protamine - aspart Inject 40 units under the skin twice daily. DX:E11.65   pantoprazole 40 MG tablet Commonly known as: PROTONIX Take 1 tablet (40 mg total) by mouth daily.   sertraline 100 MG tablet Commonly known as: ZOLOFT Take 1 tablet (100 mg total) by mouth daily.   torsemide 20 MG tablet Commonly known as: DEMADEX Take 2 tablets (23m) by mouth every OTHER day.   TTyler AasFlexTouch 200 UNIT/ML Sopn Generic drug: Insulin Degludec Inject 44 Units into the skin daily.   Victoza 18 MG/3ML Sopn Generic drug: liraglutide INJECT 0.3ML (1.8MG TOTAL) INTO THE SKIN DAILY BEFORE SUPPER   vitamin B-12 100 MCG tablet Commonly known as: CYANOCOBALAMIN Take 100 mcg by mouth daily.       Allergies:  Allergies  Allergen Reactions  . Ativan [Lorazepam] Anxiety    Pt gets more agitated  . Adhesive [Tape] Other (See Comments)    blisters    Past Medical History:  Diagnosis Date  . Acute cholecystitis s/p lap cholecystectomy 03/05/2017  03/04/2017  . Anemia, iron deficiency   . Anxiety   . Arthritis   . Atrial fibrillation with rapid ventricular response ([facility]   . BPH (benign prostatic hypertrophy)   . CAD (coronary artery disease)    Nonobstructive CAD per cath  . Cardiac pacemaker in situ   . CHF (congestive heart failure) ([facility]   . Chronic ulcer of right foot ([facility]   . CKD (chronic kidney disease) stage 4, GFR 15-29 ml/min (HCC) 09/26/2017  . 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 Coronary artery disease involving native coronary artery of native heart without angina pectoris   . Diastolic dysfunction   . Dyspnea   . 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 History of cellulitis    right great toe 10-25-2014  . History of skin cancer   . HOCM (hypertrophic obstructive cardiomyopathy) ([facility]   . Hyperlipidemia associated with type 2 diabetes mellitus ([facility] 10/25/2013  . Hypertension   . Hypertension associated with diabetes ([facility] [place] 10/25/2013  . 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 Insulin dependent type 2 diabetes mellitus ([facility] 1991   followd by dr kDwyane Dee-  has insulin pump  . Insulin pump in place   . OSA on CPAP   . 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 Peripheral neuropathy    severe  . Peripheral vascular disease ([facility]    bilateral lower extremities  . PVD (peripheral vascular disease) ([facility]   . Rib fracture 07/24/2015  . Seasonal and perennial allergic rhinitis 10/25/2013  . Seborrheic keratosis 09/19/2016  . Secondary hyperparathyroidism of renal origin ([facility]   . 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 Sinus node dysfunction (HCC)   . Sleep apnea   . Syncope 07/24/2015    Past Surgical History:  Procedure Laterality Date  .  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 AMPUTATION TOE Right 03/12/2015   Procedure: RIGHT HALLUS AMPUTATION ;  Surgeon: [name], MD;  Location: [facility];  Service: Podiatry;  Laterality: Right;  . CARDIAC CATHETERIZATION  11-25-2010   [place], [place]   Nonobstructive CAD  . CARDIAC PACEMAKER PLACEMENT  Nov 2009   [employer]  . CHOLECYSTECTOMY N/A 03/05/2017   Procedure: LAPAROSCOPIC CHOLECYSTECTOMY WITH INTRAOPERATIVE CHOLANGIOGRAM;  Surgeon: [name], MD;  [facility];  Service: General;  Laterality: N/A;  . EP IMPLANTABLE DEVICE N/A 06/03/2015   Procedure: PPM Generator Changeout;  Surgeon: [name], MD;  Location: [facility];  Service: Cardiovascular;  Laterality: N/A;  . EXCISION BONE CYST Right 03/06/2015   Procedure: BONE BIOPSIES OF RIGHT FOOT;  Surgeon: [name], MD;  Location: [facility];  Service: Podiatry;  Laterality: Right;  . ORIF ANKLE FRACTURE Left 11/06/2014   Procedure: OPEN REDUCTION INTERNAL FIXATION (ORIF) LEFT  ANKLE FRACTURE;  Surgeon: [name], MD;  Location: [facility];  Service: Orthopedics;  Laterality: Left;  . TEE WITHOUT CARDIOVERSION N/A 09/01/2017   Procedure: TRANSESOPHAGEAL ECHOCARDIOGRAM (TEE);  Surgeon: [name], MD;  Location: [facility];  Service: Cardiovascular;  Laterality: N/A;  . TOTAL KNEE ARTHROPLASTY    . VEIN LIGATION AND STRIPPING      Family History  Problem Relation Age of Onset  . Cancer Mother        breast  . Heart attack Father   . Stroke Father     Social History:  reports that he quit smoking about 35 years ago. He has a 60.00 pack-year smoking history. He has never used smokeless tobacco. He reports current alcohol use of about 1.0 standard drinks of alcohol per week. He reports that he does not use drugs.    Review of Systems         Lipids: Had been on and off Lipitor since late 2014 and is now taking 10 mg.   LDL is below 70 Previously had high ALT with Lipitor  HDL is consistently low  He is taking OTC  fish oil  Also taking cholestyramine for other  reasons and triglycerides are relatively high   Lab Results  Component Value Date   CHOL 108 03/01/2019   CHOL 138 01/06/2019   CHOL 122 07/17/2018   Lab Results  Component Value Date   HDL 22.10 (L) 03/01/2019   HDL 23 (L) 01/06/2019   HDL 25.80 (L) 07/17/2018   Lab Results  Component Value Date   LDLCALC 73 01/06/2019   LDLCALC 62 07/17/2018   LDLCALC 106 (H) 08/31/2017   Lab Results  Component Value Date   TRIG 255.0 (H) 03/01/2019   TRIG 210 (H) 01/06/2019   TRIG 168.0 (H) 07/17/2018   Lab Results  Component Value Date   CHOLHDL 5 03/01/2019   CHOLHDL 6.0 01/06/2019   CHOLHDL 5 07/17/2018   Lab Results  Component Value Date   LDLDIRECT 37.0 03/01/2019   LDLDIRECT 50.0 12/27/2017   LDLDIRECT 76.0 07/21/2017    Lab Results  Component Value Date   ALT 14 03/01/2019       HYPERTENSION: The blood pressure has been treated with various drugs including carvedilol 25 mg by his other physicians.    BP Readings from Last 3 Encounters:  06/05/19 (!) 143/50  05/29/19 (!) 154/50  04/03/19 (!) 108/38  Chronic kidney disease: Has variable results, is followed by nephrologist  Creatinine ranges:  Lab Results  Component Value Date   CREATININE 2.23 (H) 03/01/2019   CREATININE 1.89 (H) 01/12/2019   CREATININE 2.25 (H) 01/11/2019         Has history of Numbness in his feet  since about 2013  He has been wearing diabetic shoes He is  under the care of a podiatrist on a regular basis  Vitamin B12 deficiency: managed with monthly injections  Lab Results  Component Value Date   VITAMINB12 500 03/06/2019   Now getting iron infusions for iron deficiency    Physical Examination:  There were no vitals taken for this visit.    ASSESSMENT:  Diabetes type 2, uncontrolled with obesity See history of present illness for detailed discussion of current diabetes management, blood sugar patterns and problems identified  He is on insulin and Victoza    His basal insulin is now Tresiba and he is now also taking NovoLog with his main meals  A1c is last 8.3 and fructosamine was 323  He and his wife had no difficulty in following instructions for his insulin regimen Also checking blood sugars fairly regularly as directed His wife has increased his basal insulin as directed based on his fasting readings but now with 48 units Tresiba his morning sugars may be low normal for him although no hypoglycemia  Postprandial readings are somewhat variable but generally appear to be fairly good and likely averaging about 170 after dinner Blood sugars may be mildly increased after lunch also which is his first meal of the day Diet appears to be better However he has had fluctuating weight, previously appeared to have lost weight when his appetite was decreased   PLAN:   Reduce Tresiba back to 46 and he may need to adjust it further if his blood sugars in the mornings are more consistently in the 80-90 range or if they start going up over 130 consistently Since he has difficulty adjusting his mealtime dose based on what he is eating he will continue same regimen of 10 units before first meal and 12 units before dinner   There are no Patient Instructions on file for this visit.        06/17/2019, 12:36 PM   Note: This office note was prepared with Dragon voice recognition system technology. Any transcriptional errors that result from this process are unintentional.    

## 2019-06-21 ENCOUNTER — Ambulatory Visit: Payer: Medicare Other | Attending: Internal Medicine

## 2019-06-21 ENCOUNTER — Other Ambulatory Visit: Payer: Self-pay | Admitting: *Deleted

## 2019-06-21 ENCOUNTER — Encounter: Payer: Self-pay | Admitting: *Deleted

## 2019-06-21 DIAGNOSIS — Z23 Encounter for immunization: Secondary | ICD-10-CM | POA: Insufficient documentation

## 2019-06-21 NOTE — Patient Outreach (Signed)
[facility]) Care Management  06/21/2019  David [name] [birth year] [id number]   [name] Monthly Outreach  Referral Date:  04/15/2019 Referral Source:  Transfer from [place] Reason for Referral:  Continued Disease Management Education Insurance:  Medicare   Outreach Attempt:  Successful telephone outreach to patient for follow up.  HIPAA verified with wife (Release of Information on File).  Wife reports patient is sleeping.  States he has received 2 iron infusion to help with his fatigue related to low iron.  Does state patient is more mentally alert after infusions but continue to be physically weak and tired, "sleeps a lot".  Has been monitoring blood sugars and reporting less hypoglycemic events over the past few weeks.  Fasting blood sugar this morning was 147 with fasting ranges of 140-180's.  Denies any falls since December.  Reports patient received his first COVID vaccine today.  Ensured wife is aware of vaccine side effects and encouraged her to monitor patient and herself for side effects.  Appointments:  Attended Telehealth appointments with primary care provider, Dr. [name] on 05/14/2019 and Endocrinologist, Dr. Dwyane Dee on 06/17/2019.  Wife reports patient will follow up with Nephrologist in March.  Plan: [name] will make next telephone outreach to patient within the month of April and wife verbally agrees to future outreaches.  [place] [phone] [name]

## 2019-06-21 NOTE — Progress Notes (Signed)
   Covid-19 Vaccination Clinic  Name:  ORVEL CUTSFORTH    MRN: [id number] DOB: 11/22/1940  06/21/2019  Mr. Haymore was observed post Covid-19 immunization for 15 minutes without incidence. He was provided with Vaccine Information Sheet and instruction to access the V-Safe system.   Mr. Qazi was instructed to call 911 with any severe reactions post vaccine: Marland Kitchen Difficulty breathing  . Swelling of your face and throat  . A fast heartbeat  . A bad rash all over your body  . Dizziness and weakness    Immunizations Administered    Name Date Dose VIS Date Route   Pfizer COVID-19 Vaccine 06/21/2019 12:49 PM 0.3 mL 04/26/2019 Intramuscular   Manufacturer: [place]   Lot: [id number]   Stephen: [id number]

## 2019-06-26 ENCOUNTER — Encounter: Payer: Self-pay | Admitting: Adult Health

## 2019-06-26 ENCOUNTER — Ambulatory Visit: Payer: Medicare Other | Admitting: Neurology

## 2019-06-26 ENCOUNTER — Other Ambulatory Visit: Payer: Self-pay

## 2019-06-26 ENCOUNTER — Ambulatory Visit (INDEPENDENT_AMBULATORY_CARE_PROVIDER_SITE_OTHER): Payer: Medicare Other | Admitting: Adult Health

## 2019-06-26 VITALS — BP 134/78 | HR 61 | Temp 97.4°F | Ht 76.0 in | Wt 282.2 lb

## 2019-06-26 DIAGNOSIS — I10 Essential (primary) hypertension: Secondary | ICD-10-CM

## 2019-06-26 DIAGNOSIS — I69319 Unspecified symptoms and signs involving cognitive functions following cerebral infarction: Secondary | ICD-10-CM

## 2019-06-26 DIAGNOSIS — E785 Hyperlipidemia, unspecified: Secondary | ICD-10-CM | POA: Diagnosis not present

## 2019-06-26 DIAGNOSIS — I639 Cerebral infarction, unspecified: Secondary | ICD-10-CM | POA: Diagnosis not present

## 2019-06-26 DIAGNOSIS — E119 Type 2 diabetes mellitus without complications: Secondary | ICD-10-CM | POA: Diagnosis not present

## 2019-06-26 DIAGNOSIS — E1169 Type 2 diabetes mellitus with other specified complication: Secondary | ICD-10-CM

## 2019-06-26 DIAGNOSIS — E1159 Type 2 diabetes mellitus with other circulatory complications: Secondary | ICD-10-CM

## 2019-06-26 DIAGNOSIS — I152 Hypertension secondary to endocrine disorders: Secondary | ICD-10-CM

## 2019-06-26 NOTE — Progress Notes (Signed)
I agree with the above plan 

## 2019-06-26 NOTE — Patient Instructions (Signed)
Continue taking Plavix and Lipitor for secondary stroke prevention.  Continue to monitor headaches. If they do not improve follow up with PCP.  Try taking Zoloft at night time to see if that helps with the daytime fatigue.  Follow up with PCP in regards to depression, BP, cholesterol, and DM.  Follow up in 6 months in regards to memory concerns or call if needed sooner

## 2019-06-26 NOTE — Progress Notes (Signed)
[facility] [address] [place]. [name] [address] [phone]       OFFICE FOLLOW UP NOTE  [name] Date of Birth:  1940/09/22 Medical Record Number:  [id number]   Reason for visit: Stroke follow up  CHIEF COMPLAINT:  Chief Complaint  Patient presents with  . Follow-up    3 mon f/u. Wife present. Rm 9. Patient mentioned that he has been having headaches. His last headache was yesterday that last all day. Still some concerns about his short term memory and fatigue.    HPI: Stroke admission 08/30/2017: [name] is a 79 y.o. male with history of diabetes, hypertension, significant heart disease status post pacemaker placement who presented with L sided weakness x 2 days.  CT head reviewed showed no acute stroke.  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  Carotid Dopplers showed right ICA stenosis of 1 to 39% but study was limited in left carotid and ended due to patient's combativeness.  2D echo showed EF of 60 to 65%.  TEE was negative for thrombus and PFO.  Pacemaker interrogated which did not show evidence of A. fib.  LDL 6 as patient was previously on fish oil was recommended to start Lipitor 10 mg.  A1c 7.6 and recommended close PCP follow-up.  Recommended DAPT of aspirin and Plavix for 3 weeks followed by Plavix alone as patient was previously on aspirin 81 mg.  Therapy is recommended CIR for continued therapies.  10/31/2017 visit: Patient is being seen today for hospital follow-up and is accompanied by his wife.  Overall he continues to do well and continues to improve with PT/OT at [place] neuro rehab clinic [place].  He continues to take Plavix with mild bruising but no bleeding.  Continues to take Lipitor without side effects myalgias.  BP satisfactory 102/53.  He has returned to all previous activities except for driving as OT has stated that he is not quite there  at multitasking at.  He states he feels a very mild weakness in his left side and does use a cane for stability but he did use this prior to the stroke as well.  Denies new or worsening stroke/TIA symptoms.  Interval history 03/05/2018: Patient returns today for follow up appointment with his wife. Wife states he has been making improvements daily. He continues to use cane on occasion but more for "security" reasons. Completed PT/OT since previous visit but remains active with his gym exercising. Continues to take plavix without bleeding but mild bruising. Continues lipitor without side effects of myalgias with recent LDL 50. Patient has returned to driving without complications along with all other activities except for golfing due to intermittent balance issues which he believes is from arthritis in his knees and decreased stamina. Blood pressure today 184/67, asymptomatic.  Rechecked manual at 178/68. Monitors at home intermittently as it was previously stable. Glucose levels have been stable as he does monitor at home.  No further concerns at this time.  Denies new or worsening stroke/TIA symptoms.  Virtual video visit 09/04/2018 :  David [name] is a 79 y.o. malewith underlying history of diabetes, hypertension, significant heart disease status post pacemaker placement and recent diagnosis of right frontal cortex posteriorly compatible with acute infarction on 08/30/17 after presenting with left sided weakness. He was initially scheduled for office follow up today but due to COVID-19 safety precautions, visit transitioned to telemedicine visit via WebEx.  He has been doing well from a stroke standpoint with residual deficits of occasional balance difficulties. He will use a cane when out of the house.  No recent falls.   Continues on Plavix and atorvastatin without reported side effects.  Lipid panel 07/17/2018 LDL 62.  Blood pressure elevated at 184/84. Blood pressure has been elevated for the past 2  months per wife. He follows with nephrology with slowly increasing hydralazine with last increase approx 4 weeks prior.  Follow-up appointment with nephrology scheduled next week but rescheduled due to COVID-19 50 precautions.  Asymptomatic hypertension but does endorse increase fatigue over the past month.  Per wife, fatigue complaints extensively worked up by PCP which was unremarkable and felt fatigue likely related to medication use.  History of OSA and reported compliance with CPAP.  Glucose levels avg 160.  A1c 7.9 on 07/17/2018. He follows with endocrinology.  His wife endorses worsening short-term memory such as having a conversation or being told something and forgetting shortly after.  This is been present for approximately the past month.  No further concerns at this time.  Denies new or worsening stroke/TIA symptoms.  Update 03/06/2019 PS : [name] for follow-up after last visit 6 months ago with Janett Billow nurse practitioner.  He was admitted to [facility] with confusion and disorientation in the setting of significantly elevated blood pressure on 01/05/2019.  MRI scan of the brain was obtained which showed no acute abnormality.  EEG showed mild generalized slowing.  Patient disorientation confusion gradual return back to baseline in 2 to 3 weeks with the wife has noticed that his being quite forgetful and not able to remember recent information.  His short-term memory is poor.  He has times in fact demanding names of his neighbors.  He finds this frustrating.  However remains quite independent at home and is managing all his activities of daily living.  He can be left alone for several hours.  He ambulates with a cane and his balance is poor.  Has had a few falls but no major injuries.  He does have family history of dementia in his dad also after a stroke and worries about it.  He remains on Plavix which is tolerating well with minor bruising but no bleeding episodes.  His blood pressure  is under good control today it is 141/60.  He states his sugars are also under good control.  Is tolerating Lipitor well without muscle aches and pains.  Update 06/26/2019: Mr. Ouida Sills is a 79 year old male who is being seen today for follow-up regarding history of stroke and post stroke cognitive impairment accompanied by his wife.  Short term memory has been stable since prior visit without worsening but denies improvement.  He has continued to do word searchs and word puzzles to help with memory as previously recommended. MMSE 24/30.  After prior visit: Dementia panel showed elevated homocysteine level therefore folic acid 1 mg daily initiated which he endorses ongoing compliance.  EEG did show mild slowing but otherwise unremarkable.  Continues on Plavix and atorvastatin for secondary stroke prevention side effects. He does report that he had a new onset headache that started on Monday afternoon and lasted all day Tuesday. He received his COVID vaccine on Friday prior to the headache.  Located left temporal with pressure sensation relieved by Tylenol and rest.  He denies any visual changes, gait/balance impairments or any other associated symptoms. Blood pressure today 134/78.  Denies new or worsening stroke/TIA symptoms.  No  further concerns at this time.    ROS:   14 system review of systems performed and negative with exception of single episode of headache, fatigue and short term memory.  PMH:  Past Medical History:  Diagnosis Date  . Acute cholecystitis s/p lap cholecystectomy 03/05/2017 03/04/2017  . Anemia, iron deficiency   . Anxiety   . Arthritis   . Atrial fibrillation with rapid ventricular response ([place])   . BPH (benign prostatic hypertrophy)   . CAD (coronary artery disease)    Nonobstructive CAD per cath  . Cardiac pacemaker in situ   . CHF (congestive heart failure) ([place])   . Chronic ulcer of right foot ([place])   . CKD (chronic kidney disease) stage 4, GFR 15-29 ml/min (HCC)  09/26/2017  . 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 Coronary artery disease involving native coronary artery of native heart without angina pectoris   . Diastolic dysfunction   . Dyspnea   . 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 History of cellulitis    right great toe 10-25-2014  . History of skin cancer   . HOCM (hypertrophic obstructive cardiomyopathy) ([place])   . Hyperlipidemia associated with type 2 diabetes mellitus ([place]) 10/25/2013  . Hypertension   . Hypertension associated with diabetes ([place]) 10/25/2013  . 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 Insulin pump in place   . OSA on CPAP   . 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 Peripheral neuropathy    severe  . Peripheral vascular disease ([place])    bilateral lower extremities  . PVD (peripheral vascular disease) ([place])   . Rib fracture 07/24/2015  . Seasonal and perennial allergic rhinitis 10/25/2013  . Seborrheic keratosis 09/19/2016  . Secondary hyperparathyroidism of renal origin ([place])   . 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 Sinus node dysfunction (H[facility])   . Sleep apnea   . Syncope 07/24/2015    PSH:  Past Surgical History:  Procedure Laterality Date  . AMPUTATION OF REPLICATED TOES  Mar 2725   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)  . AMPUTATION TOE Right 03/12/2015   Procedure: RIGHT HALLUS AMPUTATION ;  Surgeon: [name], MD;  Location: [facility];  Service: Podiatry;  Laterality: Right;  . CARDIAC CATHETERIZATION  11-25-2010   [place], [place]   Nonobstructive CAD  . CARDIAC PACEMAKER PLACEMENT  Nov 2009   [employer]  . CHOLECYSTECTOMY N/A 03/05/2017   Procedure: LAPAROSCOPIC CHOLECYSTECTOMY WITH INTRAOPERATIVE CHOLANGIOGRAM;  Surgeon: [name], MD;  Location: [facility];  Service: General;   Laterality: N/A;  . EP IMPLANTABLE DEVICE N/A 06/03/2015   Procedure: PPM Generator Changeout;  Surgeon: [name], MD;  Location: [facility];  Service: Cardiovascular;  Laterality: N/A;  . EXCISION BONE CYST Right 03/06/2015   Procedure: BONE BIOPSIES OF RIGHT FOOT;  Surgeon: [name], MD;  Location: [facility];  Service: Podiatry;  Laterality: Right;  . ORIF ANKLE FRACTURE Left 11/06/2014   Procedure: OPEN REDUCTION INTERNAL FIXATION (ORIF) LEFT  ANKLE FRACTURE;  Surgeon: [name], MD;  Location: [facility];  Service: Orthopedics;  Laterality: Left;  . TEE WITHOUT CARDIOVERSION N/A 09/01/2017   Procedure: TRANSESOPHAGEAL ECHOCARDIOGRAM (TEE);  Surgeon: [name], MD;  Location: [facility];  Service: Cardiovascular;  Laterality: N/A;  . TOTAL KNEE ARTHROPLASTY    . VEIN LIGATION AND STRIPPING      Social History:  Social History   Socioeconomic History  . Marital status: Married    Spouse name: Not on file  . Number of children: 3  . Years of education: 22  . Highest education level: Not on file  Occupational History  . Occupation: Retired  Tobacco Use  . Smoking status: Former Smoker    Packs/day: 2.00    Years: 30.00    Pack years: 60.00    Quit date: 03/03/1984    Years since quitting: 35.3  . Smokeless tobacco: Never Used  Substance and Sexual Activity  . Alcohol use: Yes    Alcohol/week: 1.0 standard drinks    Types: 1 Glasses of wine per week    Comment: social  . Drug use: No  . Sexual activity: Not Currently  Other Topics Concern  . Not on file  Social History Narrative   Currently resides with his wife. 1 dog. Fun/Hobby: Golf    Denies any religious beliefs effecting health care.    Social Determinants of Health   Financial Resource Strain: Low Risk   . Difficulty of Paying Living Expenses: Not hard at all  Food Insecurity: No Food Insecurity  . Worried About Charity fundraiser in the Last Year: Never  true  . Ran Out of Food in the Last Year: Never true  Transportation Needs: No Transportation Needs  . Lack of Transportation (Medical): No  . Lack of Transportation (Non-Medical): No  Physical Activity:   . Days of Exercise per Week: Not on file  . Minutes of Exercise per Session: Not on file  Stress:   . Feeling of Stress : Not on file  Social Connections:   . Frequency of Communication with Friends and Family: Not on file  . Frequency of Social Gatherings with Friends and Family: Not on file  . Attends Religious Services: Not on file  . Active Member of Clubs or Organizations: Not on file  . Attends Archivist Meetings: Not on file  . Marital Status: Not on file  Intimate Partner Violence:   . Fear of Current or Ex-Partner: Not on file  . Emotionally Abused: Not on file  . Physically Abused: Not on file  . Sexually Abused: Not on file    Family History:  Family History  Problem Relation Age of Onset  . Cancer Mother        breast  . Heart attack Father   . Stroke Father     Medications:   Current Outpatient Medications on File Prior to Visit  Medication Sig Dispense Refill  . acetaminophen (TYLENOL) 325 MG tablet Take 1-2 tablets (325-650 mg total) by mouth every 4 (four) hours as needed for mild pain.    Marland Kitchen amLODipine (NORVASC) 5 MG tablet Take 1 tablet (5 mg total) by mouth daily. 90 tablet 1  . atorvastatin (LIPITOR) 10 MG tablet TAKE 1 TABLET BY MOUTH ONCE DAILY AT  6PM 90 tablet 1  . blood glucose meter kit and supplies KIT Dispense based on patient and insurance preference. Use up to four times daily as directed. (FOR ICD-9 250.00, 250.01). 1 each 0  . calcitRIOL (ROCALTROL) 0.25 MCG capsule Take 1 capsule by mouth once daily 90 capsule 0  . carvedilol (COREG) 25 MG tablet TAKE 1 TABLET BY MOUTH TWICE DAILY WITH A MEAL 180 tablet 0  . cholecalciferol (VITAMIN D)  1000 units tablet Take 1 tablet (1,000 Units total) by mouth daily. 30 tablet 1  .  cholestyramine light (PREVALITE) 4 g packet Take 1 packet (4 g total) by mouth 2 (two) times daily. 60 each 5  . clopidogrel (PLAVIX) 75 MG tablet Take 1 tablet by mouth once daily 90 tablet 0  . diclofenac sodium (VOLTAREN) 1 % GEL Apply 2 g topically 4 (four) times daily. 774 g 0  . folic acid (FOLVITE) 1 MG tablet Take 1 mg by mouth daily.    Marland Kitchen glucose blood test strip Use Contour test strips as instructed to check blood sugar four times daily. 200 each 2  . hydrALAZINE (APRESOLINE) 100 MG tablet Take 1 tablet (100 mg total) by mouth 2 (two) times daily. 180 tablet 0  . insulin aspart (NOVOLOG FLEXPEN) 100 UNIT/ML FlexPen 10 units before breakfast, 6 before lunch and 12 before dinner 15 mL 1  . Insulin Degludec (TRESIBA FLEXTOUCH) 200 UNIT/ML SOPN Inject 44 Units into the skin daily. 3 pen 1  . Insulin Syringe-Needle U-100 (INSULIN SYRINGE 1CC/31GX5/16") 31G X 5/16" 1 ML MISC 1 each by Does not apply route 3 (three) times daily. Use insulin syringe to inject insulin three times daily. 100 each 2  . isosorbide mononitrate (IMDUR) 30 MG 24 hr tablet Take 2 tablets by mouth once daily 180 tablet 3  . Omega-3 Fatty Acids (FISH OIL) 1000 MG CAPS Take 1 capsule (1,000 mg total) by mouth every morning. 30 capsule 1  . pantoprazole (PROTONIX) 40 MG tablet Take 1 tablet (40 mg total) by mouth daily. 90 tablet 1  . sertraline (ZOLOFT) 100 MG tablet Take 1 tablet (100 mg total) by mouth daily. 90 tablet 1  . torsemide (DEMADEX) 20 MG tablet Take 2 tablets (69m) by mouth every OTHER day. 180 tablet 3  . VICTOZA 18 MG/3ML SOPN INJECT 0.3ML (1.8MG TOTAL) INTO THE SKIN DAILY BEFORE SUPPER 9 mL 0  . vitamin B-12 (CYANOCOBALAMIN) 100 MCG tablet Take 100 mcg by mouth daily.     No current facility-administered medications on file prior to visit.    Allergies:   Allergies  Allergen Reactions  . Ativan [Lorazepam] Anxiety    Pt gets more agitated  . Adhesive [Tape] Other (See Comments)    blisters      Physical Exam  Vitals:   06/26/19 1045  BP: 134/78  Pulse: 61  Temp: (!) 97.4 F (36.3 C)  TempSrc: Oral  Weight: 282 lb 3.2 oz (128 kg)  Height: '6\' 4"'  (1.93 m)   Body mass index is 34.35 kg/m. No exam data present  General: Obese pleasant elderly Caucasian male, seated, in no evident distress Head: head normocephalic and atraumatic.   Neck: supple with no carotid or supraclavicular bruits Cardiovascular: regular rate and rhythm, no murmurs; +1 edema BLE Musculoskeletal: no deformity Skin:  no rash/petichiae Vascular:  Normal pulses all extremities  Neurologic Exam Mental Status: Awake and fully alert.  Normal speech and language.  Oriented to place and time. Recent and remote memory intact. Attention span, concentration and fund of knowledge appropriate. Mood and affect appropriate.  MMSE - Mini Mental State Exam 06/26/2019 06/22/2017 05/18/2016  Not completed: (No Data) (No Data) -  Orientation to time 4 - 5  Orientation to Place 3 - 5  Registration 3 - 3  Attention/ Calculation 1 - 5  Recall 2 - 3  Language- name 2 objects 2 - 2  Language- repeat 1 - 1  Language- follow  3 step command 3 - 3  Language- read & follow direction 1 - 1  Write a sentence 0 - 1  Copy design 1 - 1  Total score 21 - 30   Cranial Nerves: Pupils equal, briskly reactive to light. Extraocular movements full without nystagmus. Visual fields full to confrontation. Hard of hearin. Facial sensation intact. Face, tongue, palate moves normally and symmetrically.  Motor: Normal bulk and tone. Normal strength in all tested extremity muscles.  Sensory.:  Intact to touch , pinprick , position and vibratory sensation.  Coordination: Rapid alternating movements normal in all extremities. Finger-to-nose and heel-to-shin performed accurately bilaterally.   Gait and Station: Currently in wheelchair d/t wife reporting hypoglycemic event prior to coming in to office. Normally walks with cane and without  difficulty.  Reflexes: 1+ and symmetric. Toes downgoing.      Diagnostic Data (Labs, Imaging, Testing)  CT HEAD WO CONTRAST  08/30/2017 IMPRESSION: 1. No acute intracranial findings. 2. Atrophy and white matter microvascular disease.  MR BRAIN WO CONTRAST 08/31/2017 IMPRESSION: No acute abnormality The patient was  not able to complete the study ADDENDUM: After further review, there are 2 small areas of restricted diffusion in the right frontal cortex posteriorly compatible with acute infarct.  VAS US CAROTID DUPLEX 08/31/2017 Final Interpretation: Right Carotid: Velocities in the right ICA are consistent with a 1-39% stenosis. Left Carotid: Study limited and ended due to patient was combative. Vertebrals: Not done. Subclavians: Not done.  ECHO COMPLETE 08/31/2017 Study Conclusions - Left ventricle: The cavity size was normal. There was moderate   concentric hypertrophy. Systolic function was normal. The   estimated ejection fraction was in the range of 60% to 65%.   Doppler parameters are consistent with abnormal left ventricular   relaxation (grade 1 diastolic dysfunction). - Left atrium: The atrium was moderately dilated. - Pericardium, extracardiac: A trivial pericardial effusion was   identified.  ECHO TEE  09/01/2017 Negative PFO No evidence of thrombus in the atrial cavity or appendage    ASSESSMENT: ARMSTEAD HEILAND is a 79 y.o. year old male here with 2 small right frontal cortical infarcts on 08/30/2017 secondary to cryptogenic source. Vascular risk factors include DM, HTN and HLD. Patient had recent admission in August 2020 secondary to confusion and disorientation in setting of hypertensive urgency but now persistent mild short term memory impairment which has been stable not worsening.  MMSE today 21/30.  Evidence of elevated homocysteine level therefore initiated folic acid 1 mg daily.  Single episode of headache 3 days post Covid vaccination not associated  with any other symptoms and denies recurrence.     PLAN: -Continue taking fish oil 5035 mg daily and folic acid 1 mg daily as well as increase participation in cognitively challenging activities like solving crossword puzzles, playing bridge and sudoku.  Also highly encouraged increasing activity level due to his sedentary lifestyle since retirement and ensuring healthy diet. -Continue Plavix and Lipitor for stroke secondary stroke prevention  -Low concern for recent headache and advised to follow-up with PCP if indicated -Continue to follow PCP for HTN, HLD and DM management -Maintain strict control of hypertension with blood pressure goal below 130/90, lipids with LDL cholesterol goal below 70 mg percent and diabetes with hemoglobin A1c goal below 6.5%.     Follow-up in 6 months or call earlier if needed  Greater than 50% of time during this 30 minute visit was spent on counseling, discussion regarding cognitive impairment post stroke with performing MMSE, discussion  regarding prior strokes and importance of managing stroke risk factors including HTN, HLD and DM, reviewing risk factor management, planning of further management, discussion with patient and family and coordination of care   Frann Rider, [facility]  [facility] [address] [place], [place] [address]  Phone [phone] Fax [phone] Note: This document was prepared with digital dictation and possible smart phrase technology. Any transcriptional errors that result from this process are unintentional.

## 2019-06-26 NOTE — Progress Notes (Deleted)
David [name] [address] [place]. [name] [address] [phone]       OFFICE FOLLOW UP NOTE  [name] Date of Birth:  Feb 03, 1941 Medical Record Number:  [id number]   Reason for visit: Stroke follow up  CHIEF COMPLAINT:  No chief complaint on file.   HPI: [name] is being seen today in the office for small areas of right frontal cortex posteriorly compatible with acute infarction on 08/30/17. History obtained from patient and chart review. Reviewed all radiology images and labs personally.  [name] is a 79 y.o. male with history of diabetes, hypertension, significant heart disease status post pacemaker placement who presented with L sided weakness x 2 days.  CT head reviewed showed no acute stroke.  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  Carotid Dopplers showed right ICA stenosis of 1 to 39% but study was limited in left carotid and ended due to patient's combativeness.  2D echo showed EF of 60 to 65%.  TEE was negative for thrombus and PFO.  Pacemaker interrogated which did not show evidence of A. fib.  LDL 6 as patient was previously on fish oil was recommended to start Lipitor 10 mg.  A1c 7.6 and recommended close PCP follow-up.  Recommended DAPT of aspirin and Plavix for 3 weeks followed by Plavix alone as patient was previously on aspirin 81 mg.  Therapy is recommended CIR for continued therapies.  10/31/2017 visit: Patient is being seen today for hospital follow-up and is accompanied by his wife.  Overall he continues to do well and continues to improve with PT/OT at [place] neuro rehab clinic [place].  He continues to take Plavix with mild bruising but no bleeding.  Continues to take Lipitor without side effects myalgias.  BP satisfactory 102/53.  He has returned to all previous activities except for driving as OT has stated that he is not quite there at  multitasking at.  He states he feels a very mild weakness in his left side and does use a cane for stability but he did use this prior to the stroke as well.  Denies new or worsening stroke/TIA symptoms.  Interval history 03/05/2018: Patient returns today for follow up appointment with his wife. Wife states he has been making improvements daily. He continues to use cane on occasion but more for "security" reasons. Completed PT/OT since previous visit but remains active with his gym exercising. Continues to take plavix without bleeding but mild bruising. Continues lipitor without side effects of myalgias with recent LDL 50. Patient has returned to driving without complications along with all other activities except for golfing due to intermittent balance issues which he believes is from arthritis in his knees and decreased stamina. Blood pressure today 184/67, asymptomatic.  Rechecked manual at 178/68. Monitors at home intermittently as it was previously stable. Glucose levels have been stable as he does monitor at home.  No further concerns at this time.  Denies new or worsening stroke/TIA symptoms.  Virtual video visit 09/04/2018 :  David [name] is a 79 y.o. malewith underlying history of diabetes, hypertension, significant heart disease status post pacemaker placement and recent diagnosis of right frontal cortex posteriorly compatible with acute infarction on 08/30/17 after presenting with left sided weakness. He was initially scheduled for office follow up today but due to COVID-19 safety precautions, visit transitioned to telemedicine visit via WebEx. He has been doing well from  a stroke standpoint with residual deficits of occasional balance difficulties. He will use a cane when out of the house.  No recent falls.   Continues on Plavix and atorvastatin without reported side effects.  Lipid panel 07/17/2018 LDL 62.  Blood pressure elevated at 184/84. Blood pressure has been elevated for the past 2 months  per wife. He follows with nephrology with slowly increasing hydralazine with last increase approx 4 weeks prior.  Follow-up appointment with nephrology scheduled next week but rescheduled due to COVID-19 50 precautions.  Asymptomatic hypertension but does endorse increase fatigue over the past month.  Per wife, fatigue complaints extensively worked up by PCP which was unremarkable and felt fatigue likely related to medication use.  History of OSA and reported compliance with CPAP.  Glucose levels avg 160.  A1c 7.9 on 07/17/2018. He follows with endocrinology.  His wife endorses worsening short-term memory such as having a conversation or being told something and forgetting shortly after.  This is been present for approximately the past month.  No further concerns at this time.  Denies new or worsening stroke/TIA symptoms.  Update 03/06/2019 : [name] for follow-up after last visit 6 months ago with Janett Billow nurse practitioner.  He was admitted to [facility] with confusion and disorientation in the setting of significantly elevated blood pressure on 01/05/2019.  MRI scan of the brain was obtained which showed no acute abnormality.  EEG showed mild generalized slowing.  Patient disorientation confusion gradual return back to baseline in 2 to 3 weeks with the wife has noticed that his being quite forgetful and not able to remember recent information.  His short-term memory is poor.  He has times in fact demanding names of his neighbors.  He finds this frustrating.  However remains quite independent at home and is managing all his activities of daily living.  He can be left alone for several hours.  He ambulates with a cane and his balance is poor.  Has had a few falls but no major injuries.  He does have family history of dementia in his dad also after a stroke and worries about it.  He remains on Plavix which is tolerating well with minor bruising but no bleeding episodes.  His blood pressure is under  good control today it is 141/60.  He states his sugars are also under good control.  Is tolerating Lipitor well without muscle aches and pains.  Update 06/26/2019: Mr. Ouida Sills is a 79 year old male who is being seen today for follow-up regarding history of stroke and post stroke cognitive impairment.  Memory has been ***.  MMSE ***.  After prior visit: Dementia panel obtained which showed slightly elevated homocysteine level therefore initiated folic acid 1 mg daily.  EEG did show mild slowing but otherwise unremarkable.  Continues on Plavix and atorvastatin for secondary stroke prevention side effects.  Blood pressure today ***.  No further concerns at this time.      ROS:   14 system review of systems performed and negative with exception of easy bruising and swelling in legs  PMH:  Past Medical History:  Diagnosis Date  . Acute cholecystitis s/p lap cholecystectomy 03/05/2017 03/04/2017  . Anemia, iron deficiency   . Anxiety   . Arthritis   . Atrial fibrillation with rapid ventricular response ([place])   . BPH (benign prostatic hypertrophy)   . CAD (coronary artery disease)    Nonobstructive CAD per cath  . Cardiac pacemaker in situ   . CHF (  congestive heart failure) ([place])   . Chronic ulcer of right foot ([place])   . CKD (chronic kidney disease) stage 4, GFR 15-29 ml/min (HCC) 09/26/2017  . 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 Coronary artery disease involving native coronary artery of native heart without angina pectoris   . Diastolic dysfunction   . Dyspnea   . 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 History of cellulitis    right great toe 10-25-2014  . History of skin cancer   . HOCM (hypertrophic obstructive cardiomyopathy) ([place])   . Hyperlipidemia associated with type 2 diabetes mellitus ([place]) 10/25/2013  . Hypertension   . Hypertension associated with diabetes ([place]) 10/25/2013    . 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 Insulin dependent type 2 diabetes mellitus ([place]) 1991   followd by [name]--  has insulin pump  . Insulin pump in place   . OSA on CPAP   . 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 Peripheral neuropathy    severe  . Peripheral vascular disease ([place])    bilateral lower extremities  . PVD (peripheral vascular disease) ([place])   . Rib fracture 07/24/2015  . Seasonal and perennial allergic rhinitis 10/25/2013  . Seborrheic keratosis 09/19/2016  . Secondary hyperparathyroidism of renal origin ([place])   . 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 Sinus node dysfunction (HCC)   . Sleep apnea   . Syncope 07/24/2015    PSH:  Past Surgical History:  Procedure Laterality Date  . AMPUTATION OF REPLICATED TOES  Mar 4944   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)  . AMPUTATION TOE Right 03/12/2015   Procedure: RIGHT HALLUS AMPUTATION ;  Surgeon: [name], MD;  Location: [facility];  Service: Podiatry;  Laterality: Right;  . CARDIAC CATHETERIZATION  11-25-2010   [place], [place]   Nonobstructive CAD  . CARDIAC PACEMAKER PLACEMENT  Nov 2009   [employer]  . CHOLECYSTECTOMY N/A 03/05/2017   Procedure: LAPAROSCOPIC CHOLECYSTECTOMY WITH INTRAOPERATIVE CHOLANGIOGRAM;  Surgeon: [name], MD;  Location: [facility];  Service: General;  Laterality: N/A;  . EP IMPLANTABLE DEVICE N/A 06/03/2015   Procedure: PPM Generator Changeout;  Surgeon: [name], MD;  Location: [facility];  Service: Cardiovascular;  Laterality: N/A;  . EXCISION BONE CYST Right 03/06/2015   Procedure: BONE BIOPSIES OF RIGHT FOOT;  Surgeon: [name], MD;  Location: [facility];  Service: Podiatry;  Laterality: Right;  . ORIF ANKLE FRACTURE Left 11/06/2014   Procedure: OPEN REDUCTION INTERNAL FIXATION (ORIF) LEFT  ANKLE FRACTURE;  Surgeon: [name], MD;  Location: [facility];  Service: Orthopedics;  Laterality: Left;  . TEE WITHOUT  CARDIOVERSION N/A 09/01/2017   Procedure: TRANSESOPHAGEAL ECHOCARDIOGRAM (TEE);  Surgeon: [name], MD;  Location: [facility];  Service: Cardiovascular;  Laterality: N/A;  . TOTAL KNEE ARTHROPLASTY    . VEIN LIGATION AND STRIPPING      Social History:  Social History   Socioeconomic History  . Marital status: Married    Spouse name: Not on file  . Number of children: 3  . Years of education: 7  . Highest education level: Not on file  Occupational History  . Occupation: Retired  Tobacco Use  . Smoking status: Former Smoker    Packs/day: 2.00    Years: 30.00    Pack years: 60.00    Quit date: 03/03/1984  Years since quitting: 35.3  . Smokeless tobacco: Never Used  Substance and Sexual Activity  . Alcohol use: Yes    Alcohol/week: 1.0 standard drinks    Types: 1 Glasses of wine per week    Comment: social  . Drug use: No  . Sexual activity: Not Currently  Other Topics Concern  . Not on file  Social History Narrative   Currently resides with his wife. 1 dog. Fun/Hobby: Golf    Denies any religious beliefs effecting health care.    Social Determinants of Health   Financial Resource Strain: Low Risk   . Difficulty of Paying Living Expenses: Not hard at all  Food Insecurity: No Food Insecurity  . Worried About Charity fundraiser in the Last Year: Never true  . Ran Out of Food in the Last Year: Never true  Transportation Needs: No Transportation Needs  . Lack of Transportation (Medical): No  . Lack of Transportation (Non-Medical): No  Physical Activity:   . Days of Exercise per Week: Not on file  . Minutes of Exercise per Session: Not on file  Stress:   . Feeling of Stress : Not on file  Social Connections:   . Frequency of Communication with Friends and Family: Not on file  . Frequency of Social Gatherings with Friends and Family: Not on file  . Attends Religious Services: Not on file  . Active Member of Clubs or Organizations: Not on file  . Attends English as a second language teacher Meetings: Not on file  . Marital Status: Not on file  Intimate Partner Violence:   . Fear of Current or Ex-Partner: Not on file  . Emotionally Abused: Not on file  . Physically Abused: Not on file  . Sexually Abused: Not on file    Family History:  Family History  Problem Relation Age of Onset  . Cancer Mother        breast  . Heart attack Father   . Stroke Father     Medications:   Current Outpatient Medications on File Prior to Visit  Medication Sig Dispense Refill  . acetaminophen (TYLENOL) 325 MG tablet Take 1-2 tablets (325-650 mg total) by mouth every 4 (four) hours as needed for mild pain.    Marland Kitchen amLODipine (NORVASC) 5 MG tablet Take 1 tablet (5 mg total) by mouth daily. 90 tablet 1  . atorvastatin (LIPITOR) 10 MG tablet TAKE 1 TABLET BY MOUTH ONCE DAILY AT  6PM 90 tablet 1  . blood glucose meter kit and supplies KIT Dispense based on patient and insurance preference. Use up to four times daily as directed. (FOR ICD-9 250.00, 250.01). 1 each 0  . calcitRIOL (ROCALTROL) 0.25 MCG capsule Take 1 capsule by mouth once daily 90 capsule 0  . carvedilol (COREG) 25 MG tablet TAKE 1 TABLET BY MOUTH TWICE DAILY WITH A MEAL 180 tablet 0  . cholecalciferol (VITAMIN D) 1000 units tablet Take 1 tablet (1,000 Units total) by mouth daily. 30 tablet 1  . cholestyramine light (PREVALITE) 4 g packet Take 1 packet (4 g total) by mouth 2 (two) times daily. 60 each 5  . clopidogrel (PLAVIX) 75 MG tablet Take 1 tablet by mouth once daily 90 tablet 0  . diclofenac sodium (VOLTAREN) 1 % GEL Apply 2 g topically 4 (four) times daily. 017 g 0  . folic acid (FOLVITE) 1 MG tablet Take 1 mg by mouth daily.    Marland Kitchen glucose blood test strip Use Contour test strips as instructed to  check blood sugar four times daily. 200 each 2  . hydrALAZINE (APRESOLINE) 100 MG tablet Take 1 tablet (100 mg total) by mouth 2 (two) times daily. 180 tablet 0  . insulin aspart (NOVOLOG FLEXPEN) 100 UNIT/ML FlexPen 10  units before breakfast, 6 before lunch and 12 before dinner 15 mL 1  . Insulin Degludec (TRESIBA FLEXTOUCH) 200 UNIT/ML SOPN Inject 44 Units into the skin daily. 3 pen 1  . Insulin Syringe-Needle U-100 (INSULIN SYRINGE 1CC/31GX5/16") 31G X 5/16" 1 ML MISC 1 each by Does not apply route 3 (three) times daily. Use insulin syringe to inject insulin three times daily. 100 each 2  . isosorbide mononitrate (IMDUR) 30 MG 24 hr tablet Take 2 tablets by mouth once daily 180 tablet 3  . NOVOLOG MIX 70/30 FLEXPEN (70-30) 100 UNIT/ML FlexPen Inject 40 units under the skin twice daily. DX:E11.65 (Patient not taking: Reported on 06/21/2019) 10 pen 2  . Omega-3 Fatty Acids (FISH OIL) 1000 MG CAPS Take 1 capsule (1,000 mg total) by mouth every morning. 30 capsule 1  . pantoprazole (PROTONIX) 40 MG tablet Take 1 tablet (40 mg total) by mouth daily. 90 tablet 1  . sertraline (ZOLOFT) 100 MG tablet Take 1 tablet (100 mg total) by mouth daily. 90 tablet 1  . torsemide (DEMADEX) 20 MG tablet Take 2 tablets (55m) by mouth every OTHER day. 180 tablet 3  . VICTOZA 18 MG/3ML SOPN INJECT 0.3ML (1.8MG TOTAL) INTO THE SKIN DAILY BEFORE SUPPER 9 mL 0  . vitamin B-12 (CYANOCOBALAMIN) 100 MCG tablet Take 100 mcg by mouth daily.     No current facility-administered medications on file prior to visit.    Allergies:   Allergies  Allergen Reactions  . Ativan [Lorazepam] Anxiety    Pt gets more agitated  . Adhesive [Tape] Other (See Comments)    blisters     Physical Exam  There were no vitals filed for this visit. There is no height or weight on file to calculate BMI. No exam data present  General: Obese elderly pleasant elderly Caucasian male, seated, in no evident distress Head: head normocephalic and atraumatic.   Neck: supple with no carotid or supraclavicular bruits Cardiovascular: regular rate and rhythm, no murmurs; +2 pitting edema BLE Musculoskeletal: no deformity Skin:  no rash/petichiae Vascular:  Normal  pulses all extremities  Neurologic Exam Mental Status: Awake and fully alert. Oriented to place and time. Recent and remote memory intact. Attention span, concentration and fund of knowledge appropriate. Mood and affect appropriate.  Diminished recall 2/3.  Able to name only 10 animals which can walk on 4 legs.  Clock drawing 4/4.  Affect not depressed Cranial Nerves: Fundoscopic exam reveals sharp disc margins. Pupils equal, briskly reactive to light. Extraocular movements full without nystagmus. Visual fields full to confrontation. Hearing intact. Facial sensation intact. Face, tongue, palate moves normally and symmetrically.  Motor: Normal bulk and tone. Normal strength in all tested extremity muscles.  Diminished fine finger movements on the left.  Orbits right over left upper extremity.  Mild weakness of left grip.  Mild weakness of ankle dorsiflexors and left hip flexors.  Sensory.:  Intact to touch , pinprick , position and vibratory sensation.  Coordination: Rapid alternating movements normal in all extremities. Finger-to-nose and heel-to-shin performed accurately bilaterally.  Very minimal amount of orbiting right arm of her left. Gait and Station: Arises from chair with mild difficulty. Stance is normal. Gait uses a cane drags left leg a bit.  Demonstrates normal  stride length and balance .  Not able to heel, toe and tandem walk without difficulty.  Reflexes: 1+ and symmetric. Toes downgoing.      Diagnostic Data (Labs, Imaging, Testing)  CT HEAD WO CONTRAST  08/30/2017 IMPRESSION: 1. No acute intracranial findings. 2. Atrophy and white matter microvascular disease.  MR BRAIN WO CONTRAST 08/31/2017 IMPRESSION: No acute abnormality The patient was  not able to complete the study ADDENDUM: After further review, there are 2 small areas of restricted diffusion in the right frontal cortex posteriorly compatible with acute infarct.  VAS US CAROTID DUPLEX 08/31/2017 Final  Interpretation: Right Carotid: Velocities in the right ICA are consistent with a 1-39% stenosis. Left Carotid: Study limited and ended due to patient was combative. Vertebrals: Not done. Subclavians: Not done.  ECHO COMPLETE 08/31/2017 Study Conclusions - Left ventricle: The cavity size was normal. There was moderate   concentric hypertrophy. Systolic function was normal. The   estimated ejection fraction was in the range of 60% to 65%.   Doppler parameters are consistent with abnormal left ventricular   relaxation (grade 1 diastolic dysfunction). - Left atrium: The atrium was moderately dilated. - Pericardium, extracardiac: A trivial pericardial effusion was   identified.  ECHO TEE  09/01/2017 Negative PFO No evidence of thrombus in the atrial cavity or appendage    ASSESSMENT: WALLIE LAGRAND is a 79 y.o. year old male here with 2 small right frontal cortical infarcts on 08/30/2017 secondary to cryptogenic source. Vascular risk factors include DM, HTN and HLD.  Patient had recent admission in August 2020 secondary to confusion and disorientation in setting of hypertensive urgency but now persistent mild memory and cognitive symptoms indicative of mild cognitive impairment    PLAN:  I recommend he start taking fish oil 1000 mg daily as well as increase participation in cognitively challenging activities like solving crossword puzzles, playing bridge and sudoku.   Check memory panel labs and EEG to look for reversible causes.  We also discussed memory compensation strategies.   Continue Plavix for stroke prevention with  Maintain strict control of hypertension with blood pressure goal below 130/90, lipids with LDL cholesterol goal below 70 mg percent and diabetes with hemoglobin A1c goal below 6.5%.   Is also encouraged to eat a healthy diet and to exercise regularly and lose weight.   He was advised to ambulate using a cane at all times and to avoid falls and injuries.    He will  return for follow-up in 3 months or call earlier if necessary.  Greater than 50% of time during this 25 minute visit was spent on counseling,explanation of diagnosis of strokes,mild cognitive impairment, reviewing risk factor management , planning of further management, discussion with patient and family and coordination of care   Frann Rider, [facility]  [name] [address] [place], [place] [address]  Phone [phone] Fax [phone] Note: This document was prepared with digital dictation and possible smart phrase technology. Any transcriptional errors that result from this process are unintentional.

## 2019-06-27 ENCOUNTER — Telehealth: Payer: Self-pay

## 2019-06-27 NOTE — Telephone Encounter (Signed)
Patient has refills at the [facility] contact pharmacy

## 2019-06-27 NOTE — Telephone Encounter (Signed)
Rx Request 

## 2019-06-27 NOTE — Telephone Encounter (Signed)
Ok with me. Please place any necessary orders. 

## 2019-06-27 NOTE — Telephone Encounter (Signed)
MEDICATION:sertraline (ZOLOFT) 100 MG tablet  PHARMACY: [facility] [address], [name] - [address] Phone:  [phone]  Fax:  [phone]       Comments:  nect appt 07/29/19 **Let patient know to contact pharmacy at the end of the day to make sure medication is ready. **  ** Please notify patient to allow 48-72 hours to process**  **Encourage patient to contact the pharmacy for refills or they can request refills through [facility]**

## 2019-07-02 DIAGNOSIS — H33311 Horseshoe tear of retina without detachment, right eye: Secondary | ICD-10-CM | POA: Diagnosis not present

## 2019-07-02 DIAGNOSIS — E113313 Type 2 diabetes mellitus with moderate nonproliferative diabetic retinopathy with macular edema, bilateral: Secondary | ICD-10-CM | POA: Diagnosis not present

## 2019-07-02 DIAGNOSIS — H43813 Vitreous degeneration, bilateral: Secondary | ICD-10-CM | POA: Diagnosis not present

## 2019-07-03 DIAGNOSIS — N184 Chronic kidney disease, stage 4 (severe): Secondary | ICD-10-CM | POA: Diagnosis not present

## 2019-07-03 DIAGNOSIS — D509 Iron deficiency anemia, unspecified: Secondary | ICD-10-CM | POA: Diagnosis not present

## 2019-07-05 ENCOUNTER — Encounter: Payer: Self-pay | Admitting: Family Medicine

## 2019-07-05 ENCOUNTER — Other Ambulatory Visit: Payer: Self-pay

## 2019-07-05 NOTE — Telephone Encounter (Signed)
Please advise if okay to refill medications? Pt is scheduled for physical in March.

## 2019-07-08 ENCOUNTER — Telehealth: Payer: Self-pay | Admitting: Family Medicine

## 2019-07-08 MED ORDER — HYDRALAZINE HCL 100 MG PO TABS: 100.0000 mg | ORAL_TABLET | Freq: Two times a day (BID) | ORAL | 0 refills | Status: DC

## 2019-07-08 MED ORDER — ATORVASTATIN CALCIUM 10 MG PO TABS: ORAL_TABLET | ORAL | 1 refills | Status: DC

## 2019-07-08 MED ORDER — SERTRALINE HCL 100 MG PO TABS: 100.0000 mg | ORAL_TABLET | Freq: Every day | ORAL | 1 refills | Status: DC

## 2019-07-08 MED ORDER — CARVEDILOL 25 MG PO TABS: ORAL_TABLET | ORAL | 0 refills | Status: DC

## 2019-07-08 MED ORDER — CLOPIDOGREL BISULFATE 75 MG PO TABS: 75.0000 mg | ORAL_TABLET | Freq: Every day | ORAL | 0 refills | Status: DC

## 2019-07-08 NOTE — Telephone Encounter (Signed)
I left a message asking the patient to call and schedule Medicare AWV with Loma Sousa ([place]).  If patient calls back, please schedule Medicare Wellness Visit at next available opening. Last AWV 05/18/2016 VDM ([place]Dee)

## 2019-07-11 ENCOUNTER — Other Ambulatory Visit: Payer: Self-pay

## 2019-07-11 ENCOUNTER — Ambulatory Visit (INDEPENDENT_AMBULATORY_CARE_PROVIDER_SITE_OTHER): Payer: Medicare Other

## 2019-07-11 DIAGNOSIS — Z0000 Encounter for general adult medical examination without abnormal findings: Secondary | ICD-10-CM

## 2019-07-11 NOTE — Progress Notes (Signed)
This visit is being conducted via phone call due to the COVID-19 pandemic. This patient has given me verbal consent via phone to conduct this visit, patient states they are participating from their home address. Some vital signs may be absent or patient reported.   Patient identification: identified by name, DOB, and current address.  Location provider: [facility], Office Persons participating in the virtual visit: Denman George, patient, and Dr. Dimas Chyle   Subjective:   David [name] is a 79 y.o. male who presents for Medicare Annual/Subsequent preventive examination.  Review of Systems:   Cardiac Risk Factors include: advanced age (>89mn, >>29women);diabetes mellitus;dyslipidemia;male gender;hypertension;sedentary lifestyle;obesity (BMI >30kg/m2)    Objective:    Vitals: There were no vitals taken for this visit.  There is no height or weight on file to calculate BMI.  Advanced Directives 07/11/2019 05/13/2019 01/14/2019 01/10/2019 01/09/2019 01/05/2019 10/05/2017  Does Patient Have a Medical Advance Directive? Yes Yes Yes Yes Yes No Yes  Type of AParamed[place]of [place] will [place] will Living will;Healthcare Power of Attorney Living will Living will - [place]  Does patient want to make changes to medical advance directive? No - Patient declined No - Patient declined No - Patient declined No - Patient declined - - -  Copy of [place] Chart? No - copy requested No - copy requested - - - - No - copy requested  Would patient like information on creating a medical advance directive? - - - - - No - Patient declined -    Tobacco Social History   Tobacco Use  Smoking Status Former Smoker  . Packs/day: 2.00  . Years: 30.00  . Pack years: 60.00  . Quit date: 03/03/1984  . Years since quitting: 35.3  Smokeless Tobacco Never Used     Counseling given: Not  Answered   Clinical Intake:  Pre-visit preparation completed: Yes  Pain : No/denies pain  Diabetes: Yes(followed by endocrinology) CBG done?: No Did pt. bring in CBG monitor from home?: No  How often do you need to have someone help you when you read instructions, pamphlets, or other written materials from your doctor or pharmacy?: 1 - Never  Interpreter Needed?: No  Information entered by :: CDenman GeorgeLPN  Past Medical History:  Diagnosis Date  . Acute cholecystitis s/p lap cholecystectomy 03/05/2017 03/04/2017  . Anemia, iron deficiency   . Anxiety   . Arthritis   . Atrial fibrillation with rapid ventricular response ([place]   . BPH (benign prostatic hypertrophy)   . CAD (coronary artery disease)    Nonobstructive CAD per cath  . Cardiac pacemaker in situ   . CHF (congestive heart failure) ([place]   . Chronic ulcer of right foot ([place]   . CKD (chronic kidney disease) stage 4, GFR 15-29 ml/min (HCC) 09/26/2017  . 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 Coronary artery disease involving native coronary artery of native heart without angina pectoris   . Diastolic dysfunction   . Dyspnea   . 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 History of cellulitis    right great toe 10-25-2014  . History of skin cancer   . HOCM (hypertrophic obstructive cardiomyopathy) (HR[place]   . Hyperlipidemia associated with type 2 diabetes mellitus (HCathedral 10/25/2013  . Hypertension   . Hypertension associated with diabetes (  [employer]) 10/25/2013  . 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 Insulin pump in place   . OSA on CPAP   . 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 Peripheral neuropathy    severe  . Peripheral vascular disease ([place])    bilateral lower extremities  . PVD (peripheral  vascular disease) ([place])   . Rib fracture 07/24/2015  . Seasonal and perennial allergic rhinitis 10/25/2013  . Seborrheic keratosis 09/19/2016  . Secondary hyperparathyroidism of renal origin ([place])   . 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 Sinus node dysfunction (HCC)   . Sleep apnea   . Syncope 07/24/2015   Past Surgical History:  Procedure Laterality Date  . AMPUTATION OF REPLICATED TOES  Mar 1031   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)  . AMPUTATION TOE Right 03/12/2015   Procedure: RIGHT HALLUS AMPUTATION ;  Surgeon: [name], MD;  Location: [facility];  Service: Podiatry;  Laterality: Right;  . CARDIAC CATHETERIZATION  11-25-2010   [place], [place]   Nonobstructive CAD  . CARDIAC PACEMAKER PLACEMENT  Nov 2009   [employer]  . CHOLECYSTECTOMY N/A 03/05/2017   Procedure: LAPAROSCOPIC CHOLECYSTECTOMY WITH INTRAOPERATIVE CHOLANGIOGRAM;  Surgeon: [name], MD;  Location: [facility];  Service: General;  Laterality: N/A;  . EP IMPLANTABLE DEVICE N/A 06/03/2015   Procedure: PPM Generator Changeout;  Surgeon: [name], MD;  Location: [facility];  Service: Cardiovascular;  Laterality: N/A;  . EXCISION BONE CYST Right 03/06/2015   Procedure: BONE BIOPSIES OF RIGHT FOOT;  Surgeon: [name], MD;  Location: [facility];  Service: Podiatry;  Laterality: Right;  . ORIF ANKLE FRACTURE Left 11/06/2014   Procedure: OPEN REDUCTION INTERNAL FIXATION (ORIF) LEFT  ANKLE FRACTURE;  Surgeon: [name], MD;  Location: [facility];  Service: Orthopedics;  Laterality: Left;  . TEE WITHOUT CARDIOVERSION N/A 09/01/2017   Procedure: TRANSESOPHAGEAL ECHOCARDIOGRAM (TEE);  Surgeon: [name], MD;  Location: [facility];  Service: Cardiovascular;  Laterality: N/A;  . TOTAL KNEE ARTHROPLASTY    . VEIN LIGATION AND STRIPPING     Family History  Problem Relation Age of Onset  . Cancer Mother        breast  . Heart attack Father   . Stroke Father    Social  History   Socioeconomic History  . Marital status: Married    Spouse name: Not on file  . Number of children: 3  . Years of education: 45  . Highest education level: Not on file  Occupational History  . Occupation: Retired  Tobacco Use  . Smoking status: Former Smoker    Packs/day: 2.00    Years: 30.00    Pack years: 60.00    Quit date: 03/03/1984    Years since quitting: 35.3  . Smokeless tobacco: Never Used  Substance and Sexual Activity  . Alcohol use: Yes    Alcohol/week: 1.0 standard drinks    Types: 1 Glasses of wine per week    Comment: social  . Drug use: No  . Sexual activity: Not Currently  Other Topics Concern  . Not on file  Social History Narrative   Currently resides with his wife. 1 dog. Fun/Hobby: Golf    Denies any religious beliefs effecting health care.    Social Determinants of Health   Financial Resource Strain: Low Risk   . Difficulty  of Paying Living Expenses: Not hard at all  Food Insecurity: No Food Insecurity  . Worried About Charity fundraiser in the Last Year: Never true  . Ran Out of Food in the Last Year: Never true  Transportation Needs: No Transportation Needs  . Lack of Transportation (Medical): No  . Lack of Transportation (Non-Medical): No  Physical Activity:   . Days of Exercise per Week: Not on file  . Minutes of Exercise per Session: Not on file  Stress:   . Feeling of Stress : Not on file  Social Connections:   . Frequency of Communication with Friends and Family: Not on file  . Frequency of Social Gatherings with Friends and Family: Not on file  . Attends Religious Services: Not on file  . Active Member of Clubs or Organizations: Not on file  . Attends Archivist Meetings: Not on file  . Marital Status: Not on file    Outpatient Encounter Medications as of 07/11/2019  Medication Sig  . acetaminophen (TYLENOL) 325 MG tablet Take 1-2 tablets (325-650 mg total) by mouth every 4 (four) hours as needed for mild  pain.  Marland Kitchen amLODipine (NORVASC) 5 MG tablet Take 1 tablet (5 mg total) by mouth daily.  Marland Kitchen atorvastatin (LIPITOR) 10 MG tablet TAKE 1 TABLET BY MOUTH ONCE DAILY AT  6PM  . blood glucose meter kit and supplies KIT Dispense based on patient and insurance preference. Use up to four times daily as directed. (FOR ICD-9 250.00, 250.01).  . calcitRIOL (ROCALTROL) 0.25 MCG capsule Take 1 capsule by mouth once daily  . carvedilol (COREG) 25 MG tablet TAKE 1 TABLET BY MOUTH TWICE DAILY WITH A MEAL  . cholecalciferol (VITAMIN D) 1000 units tablet Take 1 tablet (1,000 Units total) by mouth daily.  . cholestyramine light (PREVALITE) 4 g packet Take 1 packet (4 g total) by mouth 2 (two) times daily.  . clopidogrel (PLAVIX) 75 MG tablet Take 1 tablet (75 mg total) by mouth daily.  . diclofenac sodium (VOLTAREN) 1 % GEL Apply 2 g topically 4 (four) times daily.  . folic acid (FOLVITE) 1 MG tablet Take 1 mg by mouth daily.  Marland Kitchen glucose blood test strip Use Contour test strips as instructed to check blood sugar four times daily.  . hydrALAZINE (APRESOLINE) 100 MG tablet Take 1 tablet (100 mg total) by mouth 2 (two) times daily.  . insulin aspart (NOVOLOG FLEXPEN) 100 UNIT/ML FlexPen 10 units before breakfast, 6 before lunch and 12 before dinner  . Insulin Degludec (TRESIBA FLEXTOUCH) 200 UNIT/ML SOPN Inject 44 Units into the skin daily.  . Insulin Syringe-Needle U-100 (INSULIN SYRINGE 1CC/31GX5/16") 31G X 5/16" 1 ML MISC 1 each by Does not apply route 3 (three) times daily. Use insulin syringe to inject insulin three times daily.  . isosorbide mononitrate (IMDUR) 30 MG 24 hr tablet Take 2 tablets by mouth once daily  . Omega-3 Fatty Acids (FISH OIL) 1000 MG CAPS Take 1 capsule (1,000 mg total) by mouth every morning.  . pantoprazole (PROTONIX) 40 MG tablet Take 1 tablet (40 mg total) by mouth daily.  . sertraline (ZOLOFT) 100 MG tablet Take 1 tablet (100 mg total) by mouth daily.  Marland Kitchen torsemide (DEMADEX) 20 MG tablet  Take 2 tablets (68m) by mouth every OTHER day.  .Marland KitchenVICTOZA 18 MG/3ML SOPN INJECT 0.3ML (1.8MG TOTAL) INTO THE SKIN DAILY BEFORE SUPPER  . vitamin B-12 (CYANOCOBALAMIN) 100 MCG tablet Take 100 mcg by mouth daily.   No  facility-administered encounter medications on file as of 07/11/2019.    Activities of Daily Living In your present state of health, do you have any difficulty performing the following activities: 07/11/2019 05/13/2019  Hearing? N Y  Comment - bilateral hearing aids  Vision? N Y  Comment - retinopathy treatments  Difficulty concentrating or making decisions? Y Y  Comment forgetfullness at times trouble remembering, mild dementia  Walking or climbing stairs? Y Y  Comment - frequent falls, trouble climbing stairs  Dressing or bathing? N Y  Comment - wife assist with bathing and dressing  Doing errands, shopping? N Y  Comment - wife and family [place] and eating ? N Y  Comment - wife and family cooks  Using the Toilet? N Y  Comment - wife assist to bathroom  In the past six months, have you accidently leaked urine? N Y  Comment - urinary incontinence  Do you have problems with loss of bowel control? N Y  Comment - trouble controlling bowels  Managing your Medications? N Y  Comment - wife manages medications  Managing your Finances? N Y  Comment - wife and family pays bills  Housekeeping or managing your Housekeeping? N Y  Comment - wife and family cleans  Some recent data might be hidden    Patient Care Team: [name], MD as PCP - General (Family Medicine) [name], MD as Consulting Physician (Nephrology) Elayne Snare, MD as Consulting Physician (Endocrinology) [name], MD as Consulting Physician (Cardiology) Leona Singleton, RN as [place] Management [place], Stephani Police, DPM as Consulting Physician (Podiatry) [place], Heather, [place] as Consulting Physician (Optometry) Frann Rider, NP as Nurse  Practitioner (Neurology)   Assessment:   This is a routine wellness examination for [place].  Exercise Activities and Dietary recommendations Current Exercise Habits: The patient does not participate in regular exercise at present  Goals    . Patient Stated     Commit to doing something you enjoy Goals to join a health club where you ease back into some form of exercise  Plan a trip to the mountain for enjoyment     . Patient Stated     Will go over to the [employer] to inquire about volunteering or part time     . Walk 3x/week 82mn each time.    . Weight (lb) < 275 lb (124.7 kg)       Fall Risk Fall Risk  07/11/2019 06/21/2019 05/13/2019 04/15/2019 03/28/2019  Falls in the past year? '1 1 1 ' (No Data) 1  Comment - - - Falls screening previously completed- caregiver denies new/ recent falls caregiver reports another fall- see narrative  Number falls in past yr: '1 1 1 ' - 1  Comment - last fall December 2020 - - -  Injury with Fall? 0 0 0 - 0  Comment minor - - - -  Risk for fall due to : History of fall(s);Impaired balance/gait;Impaired mobility History of fall(s);Impaired balance/gait;Impaired mobility;Impaired vision;Medication side effect History of fall(s);Medication side effect;Impaired balance/gait;Impaired mobility;Impaired vision - History of fall(s);Impaired balance/gait;Impaired mobility;Medication side effect;Other (Comment)  Risk for fall due to: Comment - - - - residual weakness post- CVA  Follow up Education provided;Falls prevention discussed;Falls evaluation completed Falls evaluation completed;Education provided;Falls prevention discussed Falls evaluation completed;Education provided;Falls prevention discussed Falls prevention discussed Education provided;Falls prevention discussed   Is the patient's home free of loose throw rugs in walkways, pet beds, electrical cords, etc?  yes      Grab bars in the bathroom? yes      Handrails on the stairs?   yes       Adequate lighting?   yes  Depression Screen PHQ 2/9 Scores 07/11/2019 05/13/2019 04/03/2019 01/18/2019  PHQ - 2 Score 0 - 0 1  PHQ- 9 Score - - - -  Exception Documentation - Other- indicate reason in comment box - -  Not completed - did not speak with patient, spoke with wife - -    Cognitive Function MMSE - Mini Mental State Exam 06/26/2019 06/22/2017 05/18/2016  Not completed: (No Data) (No Data) -  Orientation to time 4 - 5  Orientation to Place 3 - 5  Registration 3 - 3  Attention/ Calculation 1 - 5  Recall 2 - 3  Language- name 2 objects 2 - 2  Language- repeat 1 - 1  Language- follow 3 step command 3 - 3  Language- read & follow direction 1 - 1  Write a sentence 0 - 1  Copy design 1 - 1  Total score 21 - 30     6CIT Screen 07/11/2019  What Year? 0 points  What month? 0 points  What time? 0 points  Count back from 20 0 points  Months in reverse 2 points  Repeat phrase 2 points  Total Score 4    Immunization History  Administered Date(s) Administered  . Fluad Quad(high Dose 65+) 03/05/2019  . Influenza, High Dose Seasonal PF 02/09/2017  . Influenza,inj,Quad PF,6+ Mos 01/30/2014  . Influenza-Unspecified 03/17/2015, 04/15/2016, 01/14/2018  . PFIZER SARS-COV-2 Vaccination 06/21/2019    Qualifies for Shingles Vaccine? Discussed and patient will check with pharmacy for coverage.  Patient education handout provided   Screening Tests Health Maintenance  Topic Date Due  . PNA vac Low Risk Adult (1 of 2 - PCV13) 08/20/2005  . OPHTHALMOLOGY EXAM  10/21/2017  . TETANUS/TDAP  07/10/2020 (Originally 08/21/1959)  . FOOT EXAM  02/26/2020  . INFLUENZA VACCINE  Completed   Cancer Screenings: Lung: Low Dose CT Chest recommended if Age 51-80 years, 30 pack-year currently smoking OR have quit w/in 15years. Patient does not qualify. Colorectal: No longer indicated      Plan:  I have personally reviewed and addressed the Medicare Annual Wellness questionnaire and have noted the  following in the patient's chart:  A. Medical and social history B. Use of alcohol, tobacco or illicit drugs  C. Current medications and supplements D. Functional ability and status E.  Nutritional status F.  Physical activity G. Advance directives H. List of other physicians I.  Hospitalizations, surgeries, and ER visits in previous 12 months J.  [place] such as hearing and vision if needed, cognitive and depression L. Referrals, records requested, and appointments- will request notes from last diabetic eye exam   In addition, I have reviewed and discussed with patient certain preventive protocols, quality metrics, and best practice recommendations. A written personalized care plan for preventive services as well as general preventive health recommendations were provided to patient.   Signed,  Denman George, LPN  Nurse Health Advisor   Nurse Notes: Patient concerned with low energy levels and daytime sleepiness.  He will discuss with nephrologist.

## 2019-07-11 NOTE — Patient Instructions (Signed)
[name] , Thank you for taking time to come for your Medicare Wellness Visit. I appreciate your ongoing commitment to your health goals. Please review the following plan we discussed and let me know if I can assist you in the future.   Screening recommendations/referrals: Colorectal Screening: No longer indicated   Vision and Dental Exams: Recommended annual ophthalmology exams for early detection of glaucoma and other disorders of the eye Recommended annual dental exams for proper oral hygiene  Diabetic Exams: Diabetic Eye Exam: recommended yearly; we will request records  Diabetic Foot Exam: recommended yearly; up to date   Vaccinations: Influenza vaccine: completed 03/05/19 Pneumococcal vaccine: recommended; please see attached information Tdap vaccine: recommended; Please call your insurance company to determine your out of pocket expense. You may  receive this vaccine at your local pharmacy or Health Dept. Shingles vaccine: Please call your insurance company to determine your out of pocket expense for the Shingrix vaccine. You may receive this vaccine at your local pharmacy. (see handout)   Advanced directives: Please bring a copy of your POA (Power of Attorney) and/or Living Will to your next appointment.  Goals: Recommend to drink at least 6-8 8oz glasses of water per day and consume a balanced diet rich in fresh fruits and vegetables.   Next appointment: Please schedule your Annual Wellness Visit with your Nurse Health Advisor in one year.  Preventive Care 17 Years and Older, Male Preventive care refers to lifestyle choices and visits with your health care provider that can promote health and wellness. What does preventive care include?  A yearly physical exam. This is also called an annual well check.  Dental exams once or twice a year.  Routine eye exams. Ask your health care provider how often you should have your eyes checked.  Personal lifestyle choices,  including:  Daily care of your teeth and gums.  Regular physical activity.  Eating a healthy diet.  Avoiding tobacco and drug use.  Limiting alcohol use.  Practicing safe sex.  Taking low doses of aspirin every day if recommended by your health care provider..  Taking vitamin and mineral supplements as recommended by your health care provider. What happens during an annual well check? The services and screenings done by your health care provider during your annual well check will depend on your age, overall health, lifestyle risk factors, and family history of disease. Counseling  Your health care provider may ask you questions about your:  Alcohol use.  Tobacco use.  Drug use.  Emotional well-being.  Home and relationship well-being.  Sexual activity.  Eating habits.  History of falls.  Memory and ability to understand (cognition).  Work and work Statistician. Screening  You may have the following tests or measurements:  Height, weight, and BMI.  Blood pressure.  Lipid and cholesterol levels. These may be checked every 5 years, or more frequently if you are over 63 years old.  Skin check.  Lung cancer screening. You may have this screening every year starting at age 11 if you have a 30-pack-year history of smoking and currently smoke or have quit within the past 15 years.  Fecal occult blood test (FOBT) of the stool. You may have this test every year starting at age 13.  Flexible sigmoidoscopy or colonoscopy. You may have a sigmoidoscopy every 5 years or a colonoscopy every 10 years starting at age 64.  Prostate cancer screening. Recommendations will vary depending on your family history and other risks.  Hepatitis C blood test.  Hepatitis B blood test.  Sexually transmitted disease (STD) testing.  Diabetes screening. This is done by checking your blood sugar (glucose) after you have not eaten for a while (fasting). You may have this done every 1-3  years.  Abdominal aortic aneurysm (AAA) screening. You may need this if you are a current or former smoker.  Osteoporosis. You may be screened starting at age 64 if you are at high risk. Talk with your health care provider about your test results, treatment options, and if necessary, the need for more tests. Vaccines  Your health care provider may recommend certain vaccines, such as:  Influenza vaccine. This is recommended every year.  Tetanus, diphtheria, and acellular pertussis (Tdap, Td) vaccine. You may need a Td booster every 10 years.  Zoster vaccine. You may need this after age 47.  Pneumococcal 13-valent conjugate (PCV13) vaccine. One dose is recommended after age 68.  Pneumococcal polysaccharide (PPSV23) vaccine. One dose is recommended after age 68. Talk to your health care provider about which screenings and vaccines you need and how often you need them. This information is not intended to replace advice given to you by your health care provider. Make sure you discuss any questions you have with your health care provider. Document Released: 05/29/2015 Document Revised: 01/20/2016 Document Reviewed: 03/03/2015 Elsevier Interactive Patient Education  2017 [place] Prevention in the Home Falls can cause injuries. They can happen to people of all ages. There are many things you can do to make your home safe and to help prevent falls. What can I do on the outside of my home?  Regularly fix the edges of walkways and driveways and fix any cracks.  Remove anything that might make you trip as you walk through a door, such as a raised step or threshold.  Trim any bushes or trees on the path to your home.  Use bright outdoor lighting.  Clear any walking paths of anything that might make someone trip, such as rocks or tools.  Regularly check to see if handrails are loose or broken. Make sure that both sides of any steps have handrails.  Any raised decks and porches  should have guardrails on the edges.  Have any leaves, snow, or ice cleared regularly.  Use sand or salt on walking paths during winter.  Clean up any spills in your garage right away. This includes oil or grease spills. What can I do in the bathroom?  Use night lights.  Install grab bars by the toilet and in the tub and shower. Do not use towel bars as grab bars.  Use non-skid mats or decals in the tub or shower.  If you need to sit down in the shower, use a plastic, non-slip stool.  Keep the floor dry. Clean up any water that spills on the floor as soon as it happens.  Remove soap buildup in the tub or shower regularly.  Attach bath mats securely with double-sided non-slip rug tape.  Do not have throw rugs and other things on the floor that can make you trip. What can I do in the bedroom?  Use night lights.  Make sure that you have a light by your bed that is easy to reach.  Do not use any sheets or blankets that are too big for your bed. They should not hang down onto the floor.  Have a firm chair that has side arms. You can use this for support while you get dressed.  Do not  have throw rugs and other things on the floor that can make you trip. What can I do in the kitchen?  Clean up any spills right away.  Avoid walking on wet floors.  Keep items that you use a lot in easy-to-reach places.  If you need to reach something above you, use a strong step stool that has a grab bar.  Keep electrical cords out of the way.  Do not use floor polish or wax that makes floors slippery. If you must use wax, use non-skid floor wax.  Do not have throw rugs and other things on the floor that can make you trip. What can I do with my stairs?  Do not leave any items on the stairs.  Make sure that there are handrails on both sides of the stairs and use them. Fix handrails that are broken or loose. Make sure that handrails are as long as the stairways.  Check any carpeting to  make sure that it is firmly attached to the stairs. Fix any carpet that is loose or worn.  Avoid having throw rugs at the top or bottom of the stairs. If you do have throw rugs, attach them to the floor with carpet tape.  Make sure that you have a light switch at the top of the stairs and the bottom of the stairs. If you do not have them, ask someone to add them for you. What else can I do to help prevent falls?  Wear shoes that:  Do not have high heels.  Have rubber bottoms.  Are comfortable and fit you well.  Are closed at the toe. Do not wear sandals.  If you use a stepladder:  Make sure that it is fully opened. Do not climb a closed stepladder.  Make sure that both sides of the stepladder are locked into place.  Ask someone to hold it for you, if possible.  Clearly mark and make sure that you can see:  Any grab bars or handrails.  First and last steps.  Where the edge of each step is.  Use tools that help you move around (mobility aids) if they are needed. These include:  Canes.  Walkers.  Scooters.  Crutches.  Turn on the lights when you go into a dark area. Replace any light bulbs as soon as they burn out.  Set up your furniture so you have a clear path. Avoid moving your furniture around.  If any of your floors are uneven, fix them.  If there are any pets around you, be aware of where they are.  Review your medicines with your doctor. Some medicines can make you feel dizzy. This can increase your chance of falling. Ask your doctor what other things that you can do to help prevent falls. This information is not intended to replace advice given to you by your health care provider. Make sure you discuss any questions you have with your health care provider. Document Released: 02/26/2009 Document Revised: 10/08/2015 Document Reviewed: 06/06/2014 Elsevier Interactive Patient Education  2017 [employer].

## 2019-07-16 ENCOUNTER — Ambulatory Visit: Payer: Medicare Other | Attending: Internal Medicine

## 2019-07-16 DIAGNOSIS — Z23 Encounter for immunization: Secondary | ICD-10-CM

## 2019-07-16 NOTE — Progress Notes (Signed)
   Covid-[facility] Vaccination [facility]  Name:  [name]    MRN: [id number] DOB: 07/18/1940  07/16/2019  [name] was observed post Covid-19 immunization for 15 minutes without incident. He was provided with Vaccine Information Sheet and instruction to access the V-Safe system.   [name] was instructed to call 911 with any severe reactions post vaccine: Marland Kitchen Difficulty breathing  . Swelling of face and throat  . A fast heartbeat  . A bad rash all over body  . Dizziness and weakness   Immunizations Administered    Name Date Dose VIS Date Route   Pfizer COVID-19 Vaccine 07/16/2019  1:17 PM 0.3 mL 04/26/2019 Intramuscular   Manufacturer: [place]   Lot: [id number]   [place]: [id number]

## 2019-07-18 ENCOUNTER — Other Ambulatory Visit: Payer: Self-pay | Admitting: Family Medicine

## 2019-07-19 DIAGNOSIS — H33311 Horseshoe tear of retina without detachment, right eye: Secondary | ICD-10-CM | POA: Diagnosis not present

## 2019-07-19 DIAGNOSIS — E113313 Type 2 diabetes mellitus with moderate nonproliferative diabetic retinopathy with macular edema, bilateral: Secondary | ICD-10-CM | POA: Diagnosis not present

## 2019-07-22 ENCOUNTER — Other Ambulatory Visit: Payer: Self-pay | Admitting: Endocrinology

## 2019-07-29 ENCOUNTER — Ambulatory Visit (INDEPENDENT_AMBULATORY_CARE_PROVIDER_SITE_OTHER): Payer: Medicare Other

## 2019-07-29 ENCOUNTER — Other Ambulatory Visit: Payer: Self-pay

## 2019-07-29 ENCOUNTER — Encounter: Payer: Self-pay | Admitting: Family Medicine

## 2019-07-29 ENCOUNTER — Ambulatory Visit (INDEPENDENT_AMBULATORY_CARE_PROVIDER_SITE_OTHER): Payer: Medicare Other | Admitting: Family Medicine

## 2019-07-29 VITALS — BP 150/50 | HR 61 | Temp 97.1°F | Ht 76.0 in | Wt 280.0 lb

## 2019-07-29 DIAGNOSIS — Z9989 Dependence on other enabling machines and devices: Secondary | ICD-10-CM

## 2019-07-29 DIAGNOSIS — R5383 Other fatigue: Secondary | ICD-10-CM

## 2019-07-29 DIAGNOSIS — G4733 Obstructive sleep apnea (adult) (pediatric): Secondary | ICD-10-CM

## 2019-07-29 DIAGNOSIS — M545 Low back pain, unspecified: Secondary | ICD-10-CM

## 2019-07-29 DIAGNOSIS — I10 Essential (primary) hypertension: Secondary | ICD-10-CM

## 2019-07-29 DIAGNOSIS — E1159 Type 2 diabetes mellitus with other circulatory complications: Secondary | ICD-10-CM | POA: Diagnosis not present

## 2019-07-29 DIAGNOSIS — I152 Hypertension secondary to endocrine disorders: Secondary | ICD-10-CM

## 2019-07-29 NOTE — Assessment & Plan Note (Signed)
Stable.  Continue CPAP nightly

## 2019-07-29 NOTE — Assessment & Plan Note (Signed)
Multifactorial.  Stable.  Recommended continuing with daily exercise and activity.

## 2019-07-29 NOTE — Assessment & Plan Note (Signed)
Elevated systolic pressure and low diastolic.  Does not currently have any symptoms of low blood pressure though has persistent fatigue.  Will continue amlodipine 5 mg daily, Coreg 25 mg twice daily, Imdur 30 mg daily, and hydralazine 100 mg twice daily

## 2019-07-29 NOTE — Progress Notes (Signed)
   ERMAL HABERER is a 79 y.o. male who presents today for an office visit.  Assessment/Plan:  New/Acute Problems: Low back Pain No red flags.  Given persistence for several months will check x-ray today.  Likely has underlying spondylosis.  May need referral to orthopedics depending on results of x-ray.  Continue conservative management with Tylenol and heating pad.  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  Chronic Problems Addressed Today: OSA on CPAP Stable.  Continue CPAP nightly.  Hypertension associated with diabetes ([place]) Elevated systolic pressure and low diastolic.  Does not currently have any symptoms of low blood pressure though has persistent fatigue.  Will continue amlodipine 5 mg daily, Coreg 25 mg twice daily, Imdur 30 mg daily, and hydralazine 100 mg twice daily  Fatigue Multifactorial.  Stable.  Recommended continuing with daily exercise and activity.  Follow up in 6 months.    Subjective:  HPI:  Patient is here for follow-up visit.  He has been doing well.  Has been having low back pain for the past several months.  Located in right lower back.  Use a heating pad and Tylenol which helps.  Symptoms usually resolve with this.  Worse with certain motions.  No reported bowel or bladder incontinence.  See A/P for status of chronic conditions addressed today.      Objective:  Physical Exam: BP (!) 150/50   Pulse 61   Temp (!) 97.1 F (36.2 C) (Temporal)   Ht 6\' 4"  (1.93 m)   Wt 280 lb (127 kg)   SpO2 97%   BMI 34.08 kg/m   Gen: No acute distress, resting comfortably CV: Regular rate and rhythm with no murmurs appreciated Pulm: Normal work of breathing, clear to auscultation bilaterally with no crackles, wheezes, or rhonchi Neuro: Grossly normal, moves all extremities Psych: Normal affect and thought content      Rynn Markiewicz M. [name], MD 07/29/2019 3:02 PM

## 2019-07-29 NOTE — Patient Instructions (Signed)
It was very nice to see you today!  No medication changes today.  We will check an x-ray of your back.  We may need to get you to see a back specialist depending on the results.  Come back to see me in 6 months for your next checkup, or sooner if needed.  Take care, Dr [name]  Please try these tips to maintain a healthy lifestyle:   Eat at least 3 REAL meals and 1-2 snacks per day.  Aim for no more than 5 hours between eating.  If you eat breakfast, please do so within one hour of getting up.    Each meal should contain half fruits/vegetables, one quarter protein, and one quarter carbs (no bigger than a computer mouse)   Cut down on sweet beverages. This includes juice, soda, and sweet tea.     Drink at least 1 glass of water with each meal and aim for at least 8 glasses per day   Exercise at least 150 minutes every week.

## 2019-07-30 ENCOUNTER — Other Ambulatory Visit: Payer: Self-pay

## 2019-07-30 DIAGNOSIS — M545 Low back pain, unspecified: Secondary | ICD-10-CM

## 2019-07-30 NOTE — Progress Notes (Signed)
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 Recommend referral to orthopedics as we discussed at his office visit.

## 2019-07-31 ENCOUNTER — Other Ambulatory Visit: Payer: Self-pay

## 2019-07-31 ENCOUNTER — Ambulatory Visit (INDEPENDENT_AMBULATORY_CARE_PROVIDER_SITE_OTHER): Payer: Medicare Other | Admitting: Family Medicine

## 2019-07-31 ENCOUNTER — Encounter: Payer: Self-pay | Admitting: Family Medicine

## 2019-07-31 DIAGNOSIS — M5441 Lumbago with sciatica, right side: Secondary | ICD-10-CM | POA: Diagnosis not present

## 2019-07-31 DIAGNOSIS — G8929 Other chronic pain: Secondary | ICD-10-CM

## 2019-07-31 NOTE — Progress Notes (Signed)
    SUBJECTIVE:   CHIEF COMPLAINT / HPI:  Patient presenting with right lower back pain for the last several months.  He reports that this pain has been present but is typically only 2-3 times a year.  It resolves on its own without any intervention.  This time, however, the pain has continued which is why the patient presents today.  Patient's wife and son are present and provide additional history.  Patient's wife typically uses topical analgesics which helps the pain, otherwise, patient is unable to use anti-inflammatories given past medical history.  Additionally, patient has had recent falls which family thinks is due to neuropathy from diabetes or residual left-sided weakness from previous stroke.  Patient does walk with a cane on right side.  PERTINENT  PMH / PSH: MI, cardiomyopathy, peripheral vascular disease, IDDM, CKD stage IV  OBJECTIVE:   There were no vitals taken for this visit.  General: Patient well-appearing, no acute distress Lower back: There is no rash on inspection or other abnormalities.Patient with tenderness to palpation around right SI joint and over iliac crest. Hips: No pain with palpation to other bony prominences or muscular groups.  Normal internal and external rotation at the hips.  5/5 strength with hip flexion, knee extension/flexion, resisted abduction and abduction bilaterally.    ASSESSMENT/PLAN:  Chronic low right sided back pain Patient with several medical comorbidities presents as new patient to clinic with right lower back pain that has been episodic for several years.  He reports that this episode of pain has not improved as it usually does.  On x-ray, patient does have several narrowed disc spaces and significant stenosis of lumbar spine.  Differential also includes musculoskeletal pain secondary to use of assistive device in the setting of chronic left-sided weakness. Will treat as lumbar radiculopathy versus sciatic pain with physical therapy at this  time.  They can continue topical analgesics for pain and continue to avoid any anti-inflammatories.  If patient has no improvement, will follow up with MRI or consider epidural spinal injection.   Wilber Oliphant, MD [place]

## 2019-07-31 NOTE — Progress Notes (Signed)
I saw and examined the patient with Dr. Maudie Mercury and agree with assessment and plan as outlined.    LBP with right side sciatica.  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  X-Rays show substantial spondylosis.    Will try PT.  Could contemplate chiro or referral for ESI if pain persists (would need to discuss plavix first).

## 2019-08-06 DIAGNOSIS — I129 Hypertensive chronic kidney disease with stage 1 through stage 4 chronic kidney disease, or unspecified chronic kidney disease: Secondary | ICD-10-CM | POA: Diagnosis not present

## 2019-08-06 DIAGNOSIS — E1122 Type 2 diabetes mellitus with diabetic chronic kidney disease: Secondary | ICD-10-CM | POA: Diagnosis not present

## 2019-08-06 DIAGNOSIS — D509 Iron deficiency anemia, unspecified: Secondary | ICD-10-CM | POA: Diagnosis not present

## 2019-08-06 DIAGNOSIS — N2581 Secondary hyperparathyroidism of renal origin: Secondary | ICD-10-CM | POA: Diagnosis not present

## 2019-08-06 DIAGNOSIS — N184 Chronic kidney disease, stage 4 (severe): Secondary | ICD-10-CM | POA: Diagnosis not present

## 2019-08-07 ENCOUNTER — Encounter: Payer: Self-pay | Admitting: Physical Therapy

## 2019-08-07 ENCOUNTER — Other Ambulatory Visit: Payer: Self-pay

## 2019-08-07 ENCOUNTER — Ambulatory Visit (INDEPENDENT_AMBULATORY_CARE_PROVIDER_SITE_OTHER): Payer: Medicare Other | Admitting: Physical Therapy

## 2019-08-07 DIAGNOSIS — M545 Low back pain, unspecified: Secondary | ICD-10-CM

## 2019-08-07 DIAGNOSIS — R2689 Other abnormalities of gait and mobility: Secondary | ICD-10-CM | POA: Diagnosis not present

## 2019-08-13 ENCOUNTER — Ambulatory Visit (INDEPENDENT_AMBULATORY_CARE_PROVIDER_SITE_OTHER): Payer: Medicare Other | Admitting: Podiatry

## 2019-08-13 ENCOUNTER — Other Ambulatory Visit: Payer: Self-pay

## 2019-08-13 ENCOUNTER — Encounter: Payer: Self-pay | Admitting: Podiatry

## 2019-08-13 VITALS — Temp 97.2°F

## 2019-08-13 DIAGNOSIS — L97521 Non-pressure chronic ulcer of other part of left foot limited to breakdown of skin: Secondary | ICD-10-CM

## 2019-08-13 DIAGNOSIS — B351 Tinea unguium: Secondary | ICD-10-CM | POA: Diagnosis not present

## 2019-08-13 DIAGNOSIS — E11621 Type 2 diabetes mellitus with foot ulcer: Secondary | ICD-10-CM

## 2019-08-13 DIAGNOSIS — E1142 Type 2 diabetes mellitus with diabetic polyneuropathy: Secondary | ICD-10-CM

## 2019-08-13 DIAGNOSIS — Z89421 Acquired absence of other right toe(s): Secondary | ICD-10-CM

## 2019-08-13 DIAGNOSIS — Z89411 Acquired absence of right great toe: Secondary | ICD-10-CM

## 2019-08-13 MED ORDER — MUPIROCIN 2 % EX OINT: TOPICAL_OINTMENT | CUTANEOUS | 1 refills | Status: DC

## 2019-08-13 NOTE — Patient Instructions (Addendum)
DRESSING CHANGES 3RD TOE LEFT FOOT:     1. KEEP LEFT  FOOT DRY AT ALL TIMES!!!!  2. CLEANSE ULCER WITH SALINE.  3. DAB DRY WITH GAUZE SPONGE.  4. APPLY A LIGHT AMOUNT OF MUPIROCIN OINTMENT TO BASE OF ULCER.  5. APPLY OUTER DRESSING/BAND-AID AS INSTRUCTED.  6. DO NOT WALK BAREFOOT!!!  7.   IF YOU EXPERIENCE ANY FEVER, CHILLS, NIGHTSWEATS, NAUSEA OR VOMITING, ELEVATED OR LOW BLOOD SUGARS, REPORT TO EMERGENCY ROOM.  8.  IF YOU EXPERIENCE INCREASED REDNESS, PAIN, SWELLING, DISCOLORATION, ODOR, PUS, DRAINAGE OR WARMTH OF YOUR FOOT, REPORT TO EMERGENCY ROOM.   Diabetes Mellitus and Foot Care Foot care is an important part of your health, especially when you have diabetes. Diabetes may cause you to have problems because of poor blood flow (circulation) to your feet and legs, which can cause your skin to:  Become thinner and drier.  Break more easily.  Heal more slowly.  Peel and crack. You may also have nerve damage (neuropathy) in your legs and feet, causing decreased feeling in them. This means that you may not notice minor injuries to your feet that could lead to more serious problems. Noticing and addressing any potential problems early is the best way to prevent future foot problems. How to care for your feet Foot hygiene  Wash your feet daily with warm water and mild soap. Do not use hot water. Then, pat your feet and the areas between your toes until they are completely dry. Do not soak your feet as this can dry your skin.  Trim your toenails straight across. Do not dig under them or around the cuticle. File the edges of your nails with an emery board or nail file.  Apply a moisturizing lotion or petroleum jelly to the skin on your feet and to dry, brittle toenails. Use lotion that does not contain alcohol and is unscented. Do not apply lotion between your toes. Shoes and socks  Wear clean socks or stockings every day. Make sure they are not too tight. Do not wear  knee-high stockings since they may decrease blood flow to your legs.  Wear shoes that fit properly and have enough cushioning. Always look in your shoes before you put them on to be sure there are no objects inside.  To break in new shoes, wear them for just a few hours a day. This prevents injuries on your feet. Wounds, scrapes, corns, and calluses  Check your feet daily for blisters, cuts, bruises, sores, and redness. If you cannot see the bottom of your feet, use a mirror or ask someone for help.  Do not cut corns or calluses or try to remove them with medicine.  If you find a minor scrape, cut, or break in the skin on your feet, keep it and the skin around it clean and dry. You may clean these areas with mild soap and water. Do not clean the area with peroxide, alcohol, or iodine.  If you have a wound, scrape, corn, or callus on your foot, look at it several times a day to make sure it is healing and not infected. Check for: ? Redness, swelling, or pain. ? Fluid or blood. ? Warmth. ? Pus or a bad smell. General instructions  Do not cross your legs. This may decrease blood flow to your feet.  Do not use heating pads or hot water bottles on your feet. They may burn your skin. If you have lost feeling in your feet or  legs, you may not know this is happening until it is too late.  Protect your feet from hot and cold by wearing shoes, such as at the beach or on hot pavement.  Schedule a complete foot exam at least once a year (annually) or more often if you have foot problems. If you have foot problems, report any cuts, sores, or bruises to your health care provider immediately. Contact a health care provider if:  You have a medical condition that increases your risk of infection and you have any cuts, sores, or bruises on your feet.  You have an injury that is not healing.  You have redness on your legs or feet.  You feel burning or tingling in your legs or feet.  You have pain  or cramps in your legs and feet.  Your legs or feet are numb.  Your feet always feel cold.  You have pain around a toenail. Get help right away if:  You have a wound, scrape, corn, or callus on your foot and: ? You have pain, swelling, or redness that gets worse. ? You have fluid or blood coming from the wound, scrape, corn, or callus. ? Your wound, scrape, corn, or callus feels warm to the touch. ? You have pus or a bad smell coming from the wound, scrape, corn, or callus. ? You have a fever. ? You have a red line going up your leg. Summary  Check your feet every day for cuts, sores, red spots, swelling, and blisters.  Moisturize feet and legs daily.  Wear shoes that fit properly and have enough cushioning.  If you have foot problems, report any cuts, sores, or bruises to your health care provider immediately.  Schedule a complete foot exam at least once a year (annually) or more often if you have foot problems. This information is not intended to replace advice given to you by your health care provider. Make sure you discuss any questions you have with your health care provider. Document Revised: 01/23/2019 Document Reviewed: 06/03/2016 Elsevier Patient Education  [place] are small areas of thickened skin that occur on the top, sides, or tip of a toe. They contain a cone-shaped core with a point that can press on a nerve below. This causes pain.  Calluses are areas of thickened skin that can occur anywhere on the body, including the hands, fingers, palms, soles of the feet, and heels. Calluses are usually larger than corns. What are the causes? Corns and calluses are caused by rubbing (friction) or pressure, such as from shoes that are too tight or do not fit properly. What increases the risk? Corns are more likely to develop in people who have misshapen toes (toe deformities), such as hammer toes. Calluses can occur with friction to any  area of the skin. They are more likely to develop in people who:  Work with their hands.  Wear shoes that fit poorly, are too tight, or are high-heeled.  Have toe deformities. What are the signs or symptoms? Symptoms of a corn or callus include:  A hard growth on the skin.  Pain or tenderness under the skin.  Redness and swelling.  Increased discomfort while wearing tight-fitting shoes, if your feet are affected. If a corn or callus becomes infected, symptoms may include:  Redness and swelling that gets worse.  Pain.  Fluid, blood, or pus draining from the corn or callus. How is this diagnosed? Corns and calluses may be  diagnosed based on your symptoms, your medical history, and a physical exam. How is this treated? Treatment for corns and calluses may include:  Removing the cause of the friction or pressure. This may involve: ? Changing your shoes. ? Wearing shoe inserts (orthotics) or other protective layers in your shoes, such as a corn pad. ? Wearing gloves.  Applying medicine to the skin (topical medicine) to help soften skin in the hardened, thickened areas.  Removing layers of dead skin with a file to reduce the size of the corn or callus.  Removing the corn or callus with a scalpel or laser.  Taking antibiotic medicines, if your corn or callus is infected.  Having surgery, if a toe deformity is the cause. Follow these instructions at home:   Take over-the-counter and prescription medicines only as told by your health care provider.  If you were prescribed an antibiotic, take it as told by your health care provider. Do not stop taking it even if your condition starts to improve.  Wear shoes that fit well. Avoid wearing high-heeled shoes and shoes that are too tight or too loose.  Wear any padding, protective layers, gloves, or orthotics as told by your health care provider.  Soak your hands or feet and then use a file or pumice stone to soften your corn or  callus. Do this as told by your health care provider.  Check your corn or callus every day for symptoms of infection. Contact a health care provider if you:  Notice that your symptoms do not improve with treatment.  Have redness or swelling that gets worse.  Notice that your corn or callus becomes painful.  Have fluid, blood, or pus coming from your corn or callus.  Have new symptoms. Summary  Corns are small areas of thickened skin that occur on the top, sides, or tip of a toe.  Calluses are areas of thickened skin that can occur anywhere on the body, including the hands, fingers, palms, and soles of the feet. Calluses are usually larger than corns.  Corns and calluses are caused by rubbing (friction) or pressure, such as from shoes that are too tight or do not fit properly.  Treatment may include wearing any padding, protective layers, gloves, or orthotics as told by your health care provider. This information is not intended to replace advice given to you by your health care provider. Make sure you discuss any questions you have with your health care provider. Document Revised: 08/22/2018 Document Reviewed: 03/15/2017 Elsevier Patient Education  [place]  WHAT IS IT? An infection that lies within the keratin of your nail plate that is caused by a fungus.  WHY ME? Fungal infections affect all ages, sexes, races, and creeds.  There may be many factors that predispose you to a fungal infection such as age, coexisting medical conditions such as diabetes, or an autoimmune disease; stress, medications, fatigue, genetics, etc.  Bottom line: fungus thrives in a warm, moist environment and your shoes offer such a location.  IS IT CONTAGIOUS? Theoretically, yes.  You do not want to share shoes, nail clippers or files with someone who has fungal toenails.  Walking around barefoot in the same room or sleeping in the same bed is unlikely to transfer  the organism.  It is important to realize, however, that fungus can spread easily from one nail to the next on the same foot.  HOW DO WE TREAT THIS?  There are several ways to treat  this condition.  Treatment may depend on many factors such as age, medications, pregnancy, liver and kidney conditions, etc.  It is best to ask your doctor which options are available to you.  8. No treatment.   Unlike many other medical concerns, you can live with this condition.  However for many people this can be a painful condition and may lead to ingrown toenails or a bacterial infection.  It is recommended that you keep the nails cut short to help reduce the amount of fungal nail. 9. Topical treatment.  These range from herbal remedies to prescription strength nail lacquers.  About 40-50% effective, topicals require twice daily application for approximately 9 to 12 months or until an entirely new nail has grown out.  The most effective topicals are medical grade medications available through physicians offices. 10. Oral antifungal medications.  With an 80-90% cure rate, the most common oral medication requires 3 to 4 months of therapy and stays in your system for a year as the new nail grows out.  Oral antifungal medications do require blood work to make sure it is a safe drug for you.  A liver function panel will be performed prior to starting the medication and after the first month of treatment.  It is important to have the blood work performed to avoid any harmful side effects.  In general, this medication safe but blood work is required. 11. Laser Therapy.  This treatment is performed by applying a specialized laser to the affected nail plate.  This therapy is noninvasive, fast, and non-painful.  It is not covered by insurance and is therefore, out of pocket.  The results have been very good with a 80-95% cure rate.  [facility] is the only practice in the area to offer this therapy. 12. Permanent Nail  Avulsion.  Removing the entire nail so that a new nail will not grow back.

## 2019-08-14 ENCOUNTER — Ambulatory Visit (INDEPENDENT_AMBULATORY_CARE_PROVIDER_SITE_OTHER): Payer: Medicare Other | Admitting: Physical Therapy

## 2019-08-14 DIAGNOSIS — R2689 Other abnormalities of gait and mobility: Secondary | ICD-10-CM | POA: Diagnosis not present

## 2019-08-14 DIAGNOSIS — M545 Low back pain, unspecified: Secondary | ICD-10-CM

## 2019-08-14 NOTE — Progress Notes (Signed)
Subjective: David [name] presents today for follow up of at risk foot care. Patient has h/o amputation of R hallux and corn(s) left 3rd digit and painful mycotic toenails b/l that are difficult to trim. Pain interferes with ambulation. Aggravating factors include wearing enclosed shoe gear. Pain is relieved with periodic professional debridement.   He states he may have misplaced his digital  toe cap.  Allergies  Allergen Reactions  . Ativan [Lorazepam] Anxiety    Pt gets more agitated  . Adhesive [Tape] Other (See Comments)    blisters     Objective: Vitals:   08/13/19 1419  Temp: (!) 97.2 F (36.2 C)    Pt 79 y.o. year old Caucasian male morbidly obese in NAD. AAO x 3.   Vascular Examination:  Capillary refill time to remaining digits delayed b/l. Faintly palpable DP pulses b/l. Pedal hair absent b/l Skin temperature gradient within normal limits b/l.  Dermatological Examination: Pedal skin with normal turgor, texture and tone bilaterally. No interdigital macerations bilaterally. Toenails L hallux, L 2nd toe, L 3rd toe, L 4th toe, L 5th toe and R 3rd toe elongated, dystrophic, thickened, and crumbly with subungual debris and tenderness to dorsal palpation. Hyperkeratotic lesion(s) L 3rd toe with partial thickness ulceration at distal tip.  No erythema, no edema, no drainage, no flocculence.  Musculoskeletal: Normal muscle strength 5/5 to all lower extremity muscle groups bilaterally, no pain crepitus or joint limitation noted with ROM b/l and digital amputation right hallux, right 2nd toe.  Neurological: Protective sensation diminished with 10g monofilament b/l.  Assessment: 1. Onychomycosis   2. Diabetic ulcer of toe of left foot associated with type 2 diabetes mellitus, limited to breakdown of skin ([place])   3. Status post amputation of lesser toe of right foot ([place])   4. Status post amputation of right great toe ([place])   5. Diabetic peripheral neuropathy associated with  type 2 diabetes mellitus ([facility])    Plan: -Toenails L hallux, L 2nd toe, L 3rd toe, L 4th toe, L 5th toe, R 3rd toe, R 4th toe and R 5th toe debrided in length and girth without iatrogenic bleeding with sterile nail nipper and dremel.  -Patient to continue soft, supportive shoe gear daily. -Patient to report any pedal injuries to medical professional immediately. -Ulcer was debrided exposing partial thickness lesion.  Ulcer was cleansed with wound cleanser. Triple antibiotic ointment was applied to base of wound with light dressing. --Prescription written for Mupirocin Ointment. Patient is to apply to left 3rd digit once daily and cover with dressing.  -Patient/POA to call should there be question/concern in the interim.  Return in about 3 weeks (around 09/03/2019) for ulcer left 3rd toe/ Plavix.

## 2019-08-15 ENCOUNTER — Other Ambulatory Visit (HOSPITAL_COMMUNITY): Payer: Self-pay | Admitting: *Deleted

## 2019-08-16 ENCOUNTER — Ambulatory Visit (HOSPITAL_COMMUNITY)
Admission: RE | Admit: 2019-08-16 | Discharge: 2019-08-16 | Disposition: A | Discharge status: Home / Self Care | Payer: Medicare Other | Source: Ambulatory Visit | Attending: Nephrology | Admitting: Nephrology

## 2019-08-16 ENCOUNTER — Other Ambulatory Visit: Payer: Self-pay

## 2019-08-16 ENCOUNTER — Encounter: Payer: Self-pay | Admitting: Physical Therapy

## 2019-08-16 DIAGNOSIS — D631 Anemia in chronic kidney disease: Secondary | ICD-10-CM | POA: Insufficient documentation

## 2019-08-16 DIAGNOSIS — N184 Chronic kidney disease, stage 4 (severe): Secondary | ICD-10-CM | POA: Diagnosis not present

## 2019-08-16 LAB — POCT HEMOGLOBIN-HEMACUE: Hemoglobin: 9.6 g/dL — ABNORMAL LOW (ref 13.0–17.0)

## 2019-08-16 MED ORDER — DARBEPOETIN ALFA 40 MCG/0.4ML IJ SOSY: 40.0000 ug | PREFILLED_SYRINGE | Freq: Once | INTRAMUSCULAR | Status: DC

## 2019-08-16 MED ORDER — DARBEPOETIN ALFA 40 MCG/0.4ML IJ SOSY
PREFILLED_SYRINGE | INTRAMUSCULAR | Status: AC
  Administered 2019-08-16: 40 ug
  Filled 2019-08-16: qty 0.4

## 2019-08-16 NOTE — Discharge Instructions (Signed)
Darbepoetin Alfa injection What is this medicine? DARBEPOETIN ALFA (dar be POE e tin AL fa) helps your body make more red blood cells. It is used to treat anemia caused by chronic kidney failure and chemotherapy. This medicine may be used for other purposes; ask your health care provider or pharmacist if you have questions. COMMON BRAND NAME(S): Aranesp What should I tell my health care provider before I take this medicine? They need to know if you have any of these conditions:  blood clotting disorders or history of blood clots  cancer patient not on chemotherapy  cystic fibrosis  heart disease, such as angina, heart failure, or a history of a heart attack  hemoglobin level of 12 g/dL or greater  high blood pressure  low levels of folate, iron, or vitamin B12  seizures  an unusual or allergic reaction to darbepoetin, erythropoietin, albumin, hamster proteins, latex, other medicines, foods, dyes, or preservatives  pregnant or trying to get pregnant  breast-feeding How should I use this medicine? This medicine is for injection into a vein or under the skin. It is usually given by a health care professional in a hospital or clinic setting. If you get this medicine at home, you will be taught how to prepare and give this medicine. Use exactly as directed. Take your medicine at regular intervals. Do not take your medicine more often than directed. It is important that you put your used needles and syringes in a special sharps container. Do not put them in a trash can. If you do not have a sharps container, call your pharmacist or healthcare provider to get one. A special MedGuide will be given to you by the pharmacist with each prescription and refill. Be sure to read this information carefully each time. Talk to your pediatrician regarding the use of this medicine in children. While this medicine may be used in children as young as 1 month of age for selected conditions, precautions do  apply. Overdosage: If you think you have taken too much of this medicine contact a poison control center or emergency room at once. NOTE: This medicine is only for you. Do not share this medicine with others. What if I miss a dose? If you miss a dose, take it as soon as you can. If it is almost time for your next dose, take only that dose. Do not take double or extra doses. What may interact with this medicine? Do not take this medicine with any of the following medications:  epoetin alfa This list may not describe all possible interactions. Give your health care provider a list of all the medicines, herbs, non-prescription drugs, or dietary supplements you use. Also tell them if you smoke, drink alcohol, or use illegal drugs. Some items may interact with your medicine. What should I watch for while using this medicine? Your condition will be monitored carefully while you are receiving this medicine. You may need blood work done while you are taking this medicine. This medicine may cause a decrease in vitamin B6. You should make sure that you get enough vitamin B6 while you are taking this medicine. Discuss the foods you eat and the vitamins you take with your health care professional. What side effects may I notice from receiving this medicine? Side effects that you should report to your doctor or health care professional as soon as possible:  allergic reactions like skin rash, itching or hives, swelling of the face, lips, or tongue  breathing problems  changes in   vision  chest pain  confusion, trouble speaking or understanding  feeling faint or lightheaded, falls  high blood pressure  muscle aches or pains  pain, swelling, warmth in the leg  rapid weight gain  severe headaches  sudden numbness or weakness of the face, arm or leg  trouble walking, dizziness, loss of balance or coordination  seizures (convulsions)  swelling of the ankles, feet, hands  unusually weak or  tired Side effects that usually do not require medical attention (report to your doctor or health care professional if they continue or are bothersome):  diarrhea  fever, chills (flu-like symptoms)  headaches  nausea, vomiting  redness, stinging, or swelling at site where injected This list may not describe all possible side effects. Call your doctor for medical advice about side effects. You may report side effects to FDA at 1-800-FDA-1088. Where should I keep my medicine? Keep out of the reach of children. Store in a refrigerator between 2 and 8 degrees C (36 and 46 degrees F). Do not freeze. Do not shake. Throw away any unused portion if using a single-dose vial. Throw away any unused medicine after the expiration date. NOTE: This sheet is a summary. It may not cover all possible information. If you have questions about this medicine, talk to your doctor, pharmacist, or health care provider.  2020 Elsevier/Gold Standard (2017-05-17 16:44:20)  

## 2019-08-16 NOTE — Therapy (Signed)
[facility] [address] [place], [name], [address] Phone: [phone]   Fax:  [phone]  Physical Therapy Evaluation  Patient Details  Name: David [name] MRN: [id number] Date of Birth: [birth year] Referring Provider (PT): caleb [name]   Encounter Date: 08/07/2019  PT End of Session - 08/16/19 2123    Visit Number  1    Number of Visits  12    Date for PT Re-Evaluation  09/18/19    Authorization Type  Medicare    PT Start Time  1510    PT Stop Time  1555    PT Time Calculation (min)  45 min    Equipment Utilized During Treatment  Gait belt    Activity Tolerance  Patient tolerated treatment well    Behavior During Therapy  [facility] for tasks assessed/performed;Flat affect       Past Medical History:  Diagnosis Date  . Acute cholecystitis s/p lap cholecystectomy 03/05/2017 03/04/2017  . Anemia, iron deficiency   . Anxiety   . Arthritis   . Atrial fibrillation with rapid ventricular response ([place])   . BPH (benign prostatic hypertrophy)   . CAD (coronary artery disease)    Nonobstructive CAD per cath  . Cardiac pacemaker in situ   . CHF (congestive heart failure) ([place])   . Chronic ulcer of right foot ([place])   . CKD (chronic kidney disease) stage 4, GFR 15-29 ml/min (HCC) 09/26/2017  . 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 Coronary artery disease involving native coronary artery of native heart without angina pectoris   . Diastolic dysfunction   . Dyspnea   . 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 History of cellulitis    right great toe 10-25-2014  . History of skin cancer   . HOCM (hypertrophic obstructive cardiomyopathy) ([place])   . Hyperlipidemia associated with type 2 diabetes mellitus ([place]) 10/25/2013  . Hypertension   . Hypertension associated with diabetes ([place]) 10/25/2013  . IDDM (insulin dependent diabetes mellitus) -  on insulin pump 03/04/2017  . 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 Insulin pump in place   . OSA on CPAP   . 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 Peripheral neuropathy    severe  . Peripheral vascular disease ([place])    bilateral lower extremities  . PVD (peripheral vascular disease) ([place])   . Rib fracture 07/24/2015  . Seasonal and perennial allergic rhinitis 10/25/2013  . Seborrheic keratosis 09/19/2016  . Secondary hyperparathyroidism of renal origin ([place])   . 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 Sinus node dysfunction (HCC)   . Sleep apnea   . Syncope 07/24/2015    Past Surgical History:  Procedure Laterality Date  . AMPUTATION OF REPLICATED TOES  Mar 8182   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)  . AMPUTATION TOE Right 03/12/2015   Procedure: RIGHT HALLUS AMPUTATION ;  Surgeon: [name], MD;  Location: [place];  Service: Podiatry;  Laterality: Right;  . CARDIAC CATHETERIZATION  11-25-2010   [place], [place]   Nonobstructive CAD  . CARDIAC PACEMAKER PLACEMENT  Nov 2009   [employer]  . CHOLECYSTECTOMY N/A 03/05/2017   Procedure: LAPAROSCOPIC CHOLECYSTECTOMY WITH INTRAOPERATIVE CHOLANGIOGRAM;  Surgeon: [name], MD;  [facility];  Service: General;  Laterality: N/A;  . EP IMPLANTABLE DEVICE N/A 06/03/2015   Procedure: PPM  Nature conservation officer;  Surgeon: [name], MD;  Location: [place] [facility];  Service: Cardiovascular;  Laterality: N/A;  . EXCISION BONE CYST Right 03/06/2015   Procedure: BONE BIOPSIES OF RIGHT FOOT;  Surgeon: [name], MD;  Location: [place];  Service: Podiatry;  Laterality: Right;  . ORIF ANKLE FRACTURE Left 11/06/2014   Procedure: OPEN REDUCTION INTERNAL FIXATION (ORIF) LEFT  ANKLE FRACTURE;  Surgeon: [name], MD;  Location: [facility];  Service: Orthopedics;  Laterality: Left;  . TEE WITHOUT CARDIOVERSION N/A 09/01/2017   Procedure: TRANSESOPHAGEAL  ECHOCARDIOGRAM (TEE);  Surgeon: [name], MD;  Location: [facility];  Service: Cardiovascular;  Laterality: N/A;  . TOTAL KNEE ARTHROPLASTY    . VEIN LIGATION AND STRIPPING      There were no vitals filed for this visit.   Subjective Assessment - 08/16/19 2119    Subjective  Pt has R LBP , has not had this previously. He also staes Previous stroke L, weakness. Pt with frequent falls. States Trouble with stairs. does have railing on stairs at home,  1 walk in shower and 1 shower with tub bench, grap bars. He thinks leg weakness causing falls, BS, BP low at times. Independent at home, has [place] and RW. SPC too short for pt. Wife has concerns for his falls.    Limitations  Sitting;Standing;Walking;House hold activities    Patient Stated Goals  decreased back pain    Currently in Pain?  Yes    Pain Score  5     Pain Location  Back    Pain Orientation  Right    Pain Descriptors / Indicators  Aching;Sore    Pain Type  Acute pain    Pain Onset  1 to 4 weeks ago    Pain Frequency  Intermittent    Aggravating Factors   activity, bending, transfers         [facility] PT Assessment - 08/16/19 0001      Assessment   Medical Diagnosis  Back pain    Referring Provider (PT)  caleb [place]    Prior Therapy  no      Precautions   Precautions  Fall      Balance Screen   Has the patient fallen in the past 6 months  Yes    How many times?  2    Has the patient had a decrease in activity level because of a fear of falling?   No    Is the patient reluctant to leave their home because of a fear of falling?   No      Prior Function   Level of Independence  Independent      Cognition   Overall Cognitive Status  Within Functional Limits for tasks assessed      ROM / Strength   AROM / PROM / Strength  AROM;Strength      AROM   Overall AROM Comments  hips: WFL,  Lumbar: mod limitation for all motions.       Strength   Overall Strength Comments  Hips: 4-/5, Knee: 4/5,       Palpation    Palpation comment  Pain in R low lumbar region, Paraspinals and QL.       Special Tests   Other special tests  Neg SLR       Transfers   Five time sit to stand comments   To be tested     Comments  DIfficult, slow transfer  sit to stand from regular chair height.       Ambulation/Gait   Gait Comments  SPC, too shot, pt not using effectively, increased lateral trunk sway, slow pace,                 Objective measurements completed on examination: See above findings.      [facility] Adult PT Treatment/Exercise - 08/16/19 0001      Exercises   Exercises  Lumbar      Lumbar Exercises: Stretches   Single Knee to Chest Stretch  2 reps;30 seconds    Pelvic Tilt  10 reps             PT Education - 08/16/19 2122    Education Details  PT POC, exam findings, HEP ( code: [id number] ) ,Home safety recommendations    Person(s) Educated  Patient;Spouse    Methods  Explanation;Demonstration;Verbal cues;Handout    Comprehension  Verbalized understanding;Returned demonstration;Verbal cues required;Need further instruction;Tactile cues required       PT Short Term Goals - 08/16/19 2129      PT SHORT TERM GOAL #2   Title  Pt to be independent with initial HEP for back pain    Time  2    Period  Weeks    Status  New    Target Date  08/21/19      PT SHORT TERM GOAL #3   Title  Pt to report decreased pain in R side of low back to 3/10 with activity    Time  2    Period  Weeks    Status  New    Target Date  08/21/19        PT Long Term Goals - 08/16/19 2130      PT LONG TERM GOAL #1   Title  Pt to be independent with final HEP    Time  6    Period  Weeks    Status  New    Target Date  09/18/19      PT LONG TERM GOAL #2   Title  Pt to report decreased pain in low back to 0-2/10 with actvitiy    Time  6    Period  Weeks    Status  New    Target Date  09/18/19      PT LONG TERM GOAL #3   Title  Pt to demo improved ability for sit to stand on 1st attempt, to  improve efficiency with mobility    Time  6    Period  Weeks    Status  New    Target Date  09/18/19      PT LONG TERM GOAL #4   Title  Pt to demo improved Score on TUG (TBD)    Time  6    Period  Weeks    Status  New    Target Date  09/18/19      PT LONG TERM GOAL #5   Title  Pt to demo proper and safe use of SPC, and obtain new cane appropriate for his height, for improved safety with gait.    Time  6    Period  Weeks    Status  New    Target Date  09/18/19             Plan - 08/16/19 2134    Clinical Impression Statement  Pt presents wtih primary complaint of R sided back pain. He has soreness with palpation of  R lumbar region. He has general stiffness, and lack of lumbar mobility. Pt also with difficulty with general mobility, transfers, gait, and stairs. He has decreased balance, and frequent falls. Pt with SPC to short for his height, and ineffective use of cane. Pt to benefit from skilled PT, with primary focus of back pain, and secondary focus of balance and gait for improved safety and falls.    Personal Factors and Comorbidities  Comorbidity 1;Comorbidity 2;Fitness    Comorbidities  Stroke, DM, Frequent falls,CKD, CHF,    Examination-Activity Limitations  Bed Mobility;Bend;Squat;Stairs;Transfers    Examination-Participation Restrictions  Cleaning;Community Activity;Shop;Laundry    Stability/Clinical Decision Making  Evolving/Moderate complexity    Clinical Decision Making  Moderate    Rehab Potential  Good    PT Frequency  2x / week    PT Duration  6 weeks    PT Treatment/Interventions  ADLs/Self Care Home Management;Cryotherapy;Gait training;DME Instruction;Traction;Moist Heat;Iontophoresis 4mg /ml Dexamethasone;Stair training;Functional mobility training;Therapeutic activities;Therapeutic exercise;Balance training;Neuromuscular re-education;Manual techniques;Orthotic Fit/Training;Patient/family education;Passive range of motion;Dry needling;Taping;Joint  Manipulations;Spinal Manipulations    Consulted and Agree with Plan of Care  Patient       Patient will benefit from skilled therapeutic intervention in order to improve the following deficits and impairments:  Abnormal gait, Decreased range of motion, Difficulty walking, Obesity, Increased muscle spasms, Decreased safety awareness, Decreased endurance, Cardiopulmonary status limiting activity, Decreased activity tolerance, Pain, Impaired flexibility, Improper body mechanics, Decreased knowledge of use of DME, Decreased balance, Decreased mobility, Decreased strength  Visit Diagnosis: Acute right-sided low back pain without sciatica  Other abnormalities of gait and mobility     Problem List Patient Active Problem List   Diagnosis Date Noted  . Diarrhea 02/18/2019  . GERD (gastroesophageal reflux disease) 01/24/2019  . Memory change 11/19/2018  . Senile purpura ([place]) 11/27/2017  . CKD (chronic kidney disease) stage 4, GFR 15-29 ml/min (HCC) 09/26/2017  . 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 Nonobstructive CAD s/p PCI 2012   . Cardiac pacemaker in situ   . PVD (peripheral vascular disease) ([place])   . Anxiety 03/22/2017  . Acute cholecystitis s/p lap cholecystectomy 03/05/2017 03/04/2017  . 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[place]) 03/04/2017  . 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 Fatigue 01/27/2017  . Low vitamin B12 level 01/27/2017  . OSA on CPAP 10/26/2014  . Seasonal and perennial allergic rhinitis 10/25/2013  . Hypertension associated with diabetes ([place]) 10/25/2013  . Hyperlipidemia associated with type 2 diabetes mellitus ([place]) 10/25/2013    Lyndee Hensen, PT, DPT 9:52 PM  08/16/19    [facility] [place], [name], [address] Phone: [phone]   Fax:  [phone]  Name: David [name] MRN: [id number] Date of Birth:  06-07-1940

## 2019-08-16 NOTE — Therapy (Signed)
[facility] [address] [place], [name], [address] Phone: [phone]   Fax:  [phone]  Physical Therapy Treatment  Patient Details  Name: David [name] MRN: [id number] Date of Birth: 04-11-1941 Referring Provider (PT): caleb [name]   Encounter Date: 08/14/2019  PT End of Session - 08/16/19 2202    Visit Number  2    Number of Visits  12    Date for PT Re-Evaluation  09/18/19    Authorization Type  Medicare    PT Start Time  7017    PT Stop Time  1423    PT Time Calculation (min)  38 min    Equipment Utilized During Treatment  Gait belt    Activity Tolerance  Patient tolerated treatment well    Behavior During Therapy  [facility] for tasks assessed/performed;Flat affect       Past Medical History:  Diagnosis Date  . Acute cholecystitis s/p lap cholecystectomy 03/05/2017 03/04/2017  . Anemia, iron deficiency   . Anxiety   . Arthritis   . Atrial fibrillation with rapid ventricular response ([place])   . BPH (benign prostatic hypertrophy)   . CAD (coronary artery disease)    Nonobstructive CAD per cath  . Cardiac pacemaker in situ   . CHF (congestive heart failure) ([place])   . Chronic ulcer of right foot ([place])   . CKD (chronic kidney disease) stage 4, GFR 15-29 ml/min (HCC) 09/26/2017  . 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 Coronary artery disease involving native coronary artery of native heart without angina pectoris   . Diastolic dysfunction   . Dyspnea   . 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 History of cellulitis    right great toe 10-25-2014  . History of skin cancer   . HOCM (hypertrophic obstructive cardiomyopathy) ([place])   . Hyperlipidemia associated with type 2 diabetes mellitus ([place]) 10/25/2013  . Hypertension   . Hypertension associated with diabetes ([place]) 10/25/2013  . IDDM (insulin dependent diabetes mellitus) -  on insulin pump 03/04/2017  . Insulin dependent type 2 diabetes mellitus ([place]) 1991   followd by [name]--  has insulin pump  . Insulin pump in place   . OSA on CPAP   . 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 Peripheral neuropathy    severe  . Peripheral vascular disease ([place])    bilateral lower extremities  . PVD (peripheral vascular disease) ([place])   . Rib fracture 07/24/2015  . Seasonal and perennial allergic rhinitis 10/25/2013  . Seborrheic keratosis 09/19/2016  . Secondary hyperparathyroidism of renal origin ([place])   . 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 Sinus node dysfunction (HCC)   . Sleep apnea   . Syncope 07/24/2015    Past Surgical History:  Procedure Laterality Date  . AMPUTATION OF REPLICATED TOES  Mar 7939   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)  . AMPUTATION TOE Right 03/12/2015   Procedure: RIGHT HALLUS AMPUTATION ;  Surgeon: [name], MD;  Location: [facility];  Service: Podiatry;  Laterality: Right;  . CARDIAC CATHETERIZATION  11-25-2010   [place], [place]   Nonobstructive CAD  . CARDIAC PACEMAKER PLACEMENT  Nov 2009   [employer]  . CHOLECYSTECTOMY N/A 03/05/2017   Procedure: LAPAROSCOPIC CHOLECYSTECTOMY WITH INTRAOPERATIVE CHOLANGIOGRAM;  Surgeon: [name], MD;  [facility];  Service: General;  Laterality: N/A;  . EP IMPLANTABLE DEVICE N/A 06/03/2015   Procedure: PPM  Nature conservation officer;  Surgeon: [name], MD;  Location: [place] [facility];  Service: Cardiovascular;  Laterality: N/A;  . EXCISION BONE CYST Right 03/06/2015   Procedure: BONE BIOPSIES OF RIGHT FOOT;  Surgeon: [name], MD;  Location: [facility];  Service: Podiatry;  Laterality: Right;  . ORIF ANKLE FRACTURE Left 11/06/2014   Procedure: OPEN REDUCTION INTERNAL FIXATION (ORIF) LEFT  ANKLE FRACTURE;  Surgeon: [name], MD;  Location: [facility];  Service: Orthopedics;  Laterality: Left;  . TEE WITHOUT CARDIOVERSION N/A 09/01/2017   Procedure: TRANSESOPHAGEAL  ECHOCARDIOGRAM (TEE);  Surgeon: [name], MD;  Location: [facility];  Service: Cardiovascular;  Laterality: N/A;  . TOTAL KNEE ARTHROPLASTY    . VEIN LIGATION AND STRIPPING      There were no vitals filed for this visit.  Subjective Assessment - 08/16/19 2201    Subjective  Pt states LBP is gone today. No new comlaints. Did obtain new cane, it is still too short.    Currently in Pain?  No/denies    Pain Score  0-No pain                       [facility] Adult PT Treatment/Exercise - 08/16/19 2156      Transfers   Five time sit to stand comments   --    Comments  --      Ambulation/Gait   Gait Comments  --      Exercises   Exercises  Lumbar      Lumbar Exercises: Stretches   Active Hamstring Stretch  3 reps;30 seconds    Active Hamstring Stretch Limitations  seated    Single Knee to Chest Stretch  2 reps;30 seconds    Lower Trunk Rotation  5 reps;10 seconds    Pelvic Tilt  15 reps      Lumbar Exercises: Standing   Other Standing Lumbar Exercises  Marching x20;     Other Standing Lumbar Exercises  L/R wight shifts x20       Lumbar Exercises: Seated   Long Arc Quad on Chair  20 reps;Both      Lumbar Exercises: Supine   Clam  20 reps    Clam Limitations  GTB    Bent Knee Raise  20 reps      Manual Therapy   Manual Therapy  Soft tissue mobilization    Soft tissue mobilization  STM, R lumbar / pt sidelying                PT Short Term Goals - 08/16/19 2129      PT SHORT TERM GOAL #2   Title  Pt to be independent with initial HEP for back pain    Time  2    Period  Weeks    Status  New    Target Date  08/21/19      PT SHORT TERM GOAL #3   Title  Pt to report decreased pain in R side of low back to 3/10 with activity    Time  2    Period  Weeks    Status  New    Target Date  08/21/19        PT Long Term Goals - 08/16/19 2130      PT LONG TERM GOAL #1   Title  Pt to be independent with final HEP    Time  6    Period  Weeks  Status  New    Target Date  09/18/19      PT LONG TERM GOAL #2   Title  Pt to report decreased pain in low back to 0-2/10 with actvitiy    Time  6    Period  Weeks    Status  New    Target Date  09/18/19      PT LONG TERM GOAL #3   Title  Pt to demo improved ability for sit to stand on 1st attempt, to improve efficiency with mobility    Time  6    Period  Weeks    Status  New    Target Date  09/18/19      PT LONG TERM GOAL #4   Title  Pt to demo improved Score on TUG (TBD)    Time  6    Period  Weeks    Status  New    Target Date  09/18/19      PT LONG TERM GOAL #5   Title  Pt to demo proper and safe use of SPC, and obtain new cane appropriate for his height, for improved safety with gait.    Time  6    Period  Weeks    Status  New    Target Date  09/18/19            Plan - 08/16/19 2203    Clinical Impression Statement  Pt with no soreness in back today with Palpation and STM. Ther ex progressed for LE strength. Discussed need for proper height cane. Plan to progress mobility as pt able.    Personal Factors and Comorbidities  Comorbidity 1;Comorbidity 2;Fitness    Comorbidities  Stroke, DM, Frequent falls,CKD, CHF,    Examination-Activity Limitations  Bed Mobility;Bend;Squat;Stairs;Transfers    Examination-Participation Restrictions  Cleaning;Community Activity;Shop;Laundry    Stability/Clinical Decision Making  Evolving/Moderate complexity    Rehab Potential  Good    PT Frequency  2x / week    PT Duration  6 weeks    PT Treatment/Interventions  ADLs/Self Care Home Management;Cryotherapy;Gait training;DME Instruction;Traction;Moist Heat;Iontophoresis 4mg /ml Dexamethasone;Stair training;Functional mobility training;Therapeutic activities;Therapeutic exercise;Balance training;Neuromuscular re-education;Manual techniques;Orthotic Fit/Training;Patient/family education;Passive range of motion;Dry needling;Taping;Joint Manipulations;Spinal Manipulations    Consulted and  Agree with Plan of Care  Patient       Patient will benefit from skilled therapeutic intervention in order to improve the following deficits and impairments:  Abnormal gait, Decreased range of motion, Difficulty walking, Obesity, Increased muscle spasms, Decreased safety awareness, Decreased endurance, Cardiopulmonary status limiting activity, Decreased activity tolerance, Pain, Impaired flexibility, Improper body mechanics, Decreased knowledge of use of DME, Decreased balance, Decreased mobility, Decreased strength  Visit Diagnosis: Acute right-sided low back pain without sciatica  Other abnormalities of gait and mobility     Problem List Patient Active Problem List   Diagnosis Date Noted  . Diarrhea 02/18/2019  . GERD (gastroesophageal reflux disease) 01/24/2019  . Memory change 11/19/2018  . Senile purpura ([place]) 11/27/2017  . CKD (chronic kidney disease) stage 4, GFR 15-29 ml/min (HCC) 09/26/2017  . 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 Nonobstructive CAD s/p PCI 2012   . Cardiac pacemaker in situ   . PVD (peripheral vascular disease) ([place])   . Anxiety 03/22/2017  . Acute cholecystitis s/p lap cholecystectomy 03/05/2017 03/04/2017  . 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[place]) 03/04/2017  . 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 Fatigue 01/27/2017  . Low vitamin  B12 level 01/27/2017  . OSA on CPAP 10/26/2014  . Seasonal and perennial allergic rhinitis 10/25/2013  . Hypertension associated with diabetes ([place]) 10/25/2013  . Hyperlipidemia associated with type 2 diabetes mellitus ([place]) 10/25/2013    Lyndee Hensen, PT, DPT 10:05 PM  08/16/19    [place], [name], [address] Phone: [phone]   Fax:  [phone]  Name: LILLARD BAILON MRN: [id number] Date of Birth: Aug 27, 1940

## 2019-08-20 ENCOUNTER — Encounter: Payer: Medicare Other | Admitting: Physical Therapy

## 2019-08-23 DIAGNOSIS — H35373 Puckering of macula, bilateral: Secondary | ICD-10-CM | POA: Diagnosis not present

## 2019-08-23 DIAGNOSIS — E113312 Type 2 diabetes mellitus with moderate nonproliferative diabetic retinopathy with macular edema, left eye: Secondary | ICD-10-CM | POA: Diagnosis not present

## 2019-08-23 DIAGNOSIS — H43813 Vitreous degeneration, bilateral: Secondary | ICD-10-CM | POA: Diagnosis not present

## 2019-08-23 DIAGNOSIS — H31091 Other chorioretinal scars, right eye: Secondary | ICD-10-CM | POA: Diagnosis not present

## 2019-08-23 DIAGNOSIS — E113313 Type 2 diabetes mellitus with moderate nonproliferative diabetic retinopathy with macular edema, bilateral: Secondary | ICD-10-CM | POA: Diagnosis not present

## 2019-08-24 ENCOUNTER — Other Ambulatory Visit: Payer: Self-pay | Admitting: Endocrinology

## 2019-08-27 ENCOUNTER — Encounter: Payer: Medicare Other | Admitting: Physical Therapy

## 2019-08-27 ENCOUNTER — Other Ambulatory Visit: Payer: Self-pay | Admitting: *Deleted

## 2019-08-27 NOTE — Patient Outreach (Signed)
[facility]) Care Management  08/27/2019  David [name] [birth year] [id number]   [place] Every Other Month Outreach  Referral Date: 04/15/2019 Referral Source: Transfer from [place] Reason for Referral: Continued Disease Management Education Insurance:Medicare   Outreach Attempt:  Outreach attempt #1 to patient for follow up.  Wife answered and stated she was driving at this time, requesting telephone call back.   Plan:  [name] will make another telephone outreach within the month of April.  [place] [phone] [email or web address]

## 2019-09-02 ENCOUNTER — Ambulatory Visit (INDEPENDENT_AMBULATORY_CARE_PROVIDER_SITE_OTHER): Payer: Medicare Other | Admitting: *Deleted

## 2019-09-02 DIAGNOSIS — I421 Obstructive hypertrophic cardiomyopathy: Secondary | ICD-10-CM | POA: Diagnosis not present

## 2019-09-03 ENCOUNTER — Encounter: Payer: Medicare Other | Admitting: Physical Therapy

## 2019-09-03 LAB — CUP PACEART REMOTE DEVICE CHECK
Battery Remaining Longevity: 46 mo
Battery Voltage: 2.98 V
Brady Statistic AP VP Percent: 0.06 %
Brady Statistic AP VS Percent: 94.92 %
Brady Statistic AS VP Percent: 0.01 %
Brady Statistic AS VS Percent: 5.01 %
Brady Statistic RA Percent Paced: 94.95 %
Brady Statistic RV Percent Paced: 0.08 %
Date Time Interrogation Session: 20210420094902
Implantable Lead Implant Date: 20091102
Implantable Lead Implant Date: 20091102
Implantable Lead Location: 753859
Implantable Lead Location: 753860
Implantable Lead Model: 5076
Implantable Lead Model: 5076
Implantable Pulse Generator Implant Date: 20170118
Lead Channel Impedance Value: 1254 Ohm
Lead Channel Impedance Value: 418 Ohm
Lead Channel Impedance Value: 418 Ohm
Lead Channel Impedance Value: 532 Ohm
Lead Channel Pacing Threshold Amplitude: 2.25 V
Lead Channel Pacing Threshold Amplitude: 2.5 V
Lead Channel Pacing Threshold Pulse Width: 0.4 ms
Lead Channel Pacing Threshold Pulse Width: 0.4 ms
Lead Channel Sensing Intrinsic Amplitude: 18.875 mV
Lead Channel Sensing Intrinsic Amplitude: 18.875 mV
Lead Channel Sensing Intrinsic Amplitude: 2 mV
Lead Channel Sensing Intrinsic Amplitude: 2 mV
Lead Channel Setting Pacing Amplitude: 2.5 V
Lead Channel Setting Pacing Amplitude: 4.5 V
Lead Channel Setting Pacing Pulse Width: 0.4 ms
Lead Channel Setting Sensing Sensitivity: 2.8 mV

## 2019-09-04 NOTE — Progress Notes (Signed)
PPM Remote  

## 2019-09-09 ENCOUNTER — Encounter: Payer: Self-pay | Admitting: *Deleted

## 2019-09-09 ENCOUNTER — Other Ambulatory Visit: Payer: Self-pay | Admitting: *Deleted

## 2019-09-09 NOTE — Patient Outreach (Signed)
[facility]) Care Management  [place]  09/09/2019   David [name] 1940-12-22 [id number]   [name] Every Other Month Outreach   Referral Date:  04/15/2019 Referral Source:  Transfer from [place] Reason for Referral:  Continued Disease Management Education Insurance:  Medicare   Outreach Attempt:  Successful telephone outreach to patient's wife for follow up.  HIPAA verified with wife, [name] (Release of Information on file).  Wife reports patient continues to be fatigued but has started with new injection to help build up iron level.  Per wife she has been able to better manage patient's diet and administer medications and has been a lot happier with his recent blood sugar readings.  Fasting blood sugar this morning was 133 with recent fasting ranges of 120-130's.  Denies any falls since December 2020.  Encounter Medications:  Outpatient Encounter Medications as of 09/09/2019  Medication Sig Note  . amLODipine (NORVASC) 5 MG tablet Take 1 tablet (5 mg total) by mouth daily.   Marland Kitchen atorvastatin (LIPITOR) 10 MG tablet TAKE 1 TABLET BY MOUTH ONCE DAILY AT  6PM   . calcitRIOL (ROCALTROL) 0.25 MCG capsule Take 1 capsule by mouth once daily   . carvedilol (COREG) 25 MG tablet TAKE 1 TABLET BY MOUTH TWICE DAILY WITH A MEAL   . cholecalciferol (VITAMIN D) 1000 units tablet Take 1 tablet (1,000 Units total) by mouth daily.   . cholestyramine light (PREVALITE) 4 g packet Take 1 packet (4 g total) by mouth 2 (two) times daily. 05/13/2019: Reports taking once a day  . clopidogrel (PLAVIX) 75 MG tablet Take 1 tablet (75 mg total) by mouth daily.   . folic acid (FOLVITE) 1 MG tablet Take 1 mg by mouth daily.   . hydrALAZINE (APRESOLINE) 100 MG tablet Take 1 tablet (100 mg total) by mouth 2 (two) times daily.   . insulin aspart (NOVOLOG FLEXPEN) 100 UNIT/ML FlexPen 10 units before breakfast, 6 before lunch and 12 before dinner   . isosorbide mononitrate  (IMDUR) 30 MG 24 hr tablet Take 2 tablets by mouth once daily   . Omega-3 Fatty Acids (FISH OIL) 1000 MG CAPS Take 1 capsule (1,000 mg total) by mouth every morning.   . pantoprazole (PROTONIX) 40 MG tablet Take 1 tablet by mouth once daily   . sertraline (ZOLOFT) 100 MG tablet Take 1 tablet (100 mg total) by mouth daily.   Marland Kitchen torsemide (DEMADEX) 20 MG tablet Take 2 tablets (57m) by mouth every OTHER day.   . TRESIBA FLEXTOUCH 200 UNIT/ML FlexTouch Pen INJECT 44 UNITS SUBCUTANEOUSLY ONCE DAILY   . VICTOZA 18 MG/3ML SOPN INJECT 0.3ML (1.8MG TOTAL) INTO THE SKIN DAILY BEFORE SUPPER   . vitamin B-12 (CYANOCOBALAMIN) 100 MCG tablet Take 100 mcg by mouth daily.   .Marland Kitchenacetaminophen (TYLENOL) 325 MG tablet Take 1-2 tablets (325-650 mg total) by mouth every 4 (four) hours as needed for mild pain.   . blood glucose meter kit and supplies KIT Dispense based on patient and insurance preference. Use up to four times daily as directed. (FOR ICD-9 250.00, 250.01).   .Marland Kitchendiclofenac sodium (VOLTAREN) 1 % GEL Apply 2 g topically 4 (four) times daily.   .Marland Kitchenglucose blood test strip Use Contour test strips as instructed to check blood sugar four times daily.   . Insulin Syringe-Needle U-100 (INSULIN SYRINGE 1CC/31GX5/16") 31G X 5/16" 1 ML MISC 1 each by Does not apply route 3 (three) times daily. Use insulin  syringe to inject insulin three times daily.   . mupirocin ointment (BACTROBAN) 2 % Apply to left 3rd digit once daily.    No facility-administered encounter medications on file as of 09/09/2019.    Functional Status:  In your present state of health, do you have any difficulty performing the following activities: 07/11/2019 05/13/2019  Hearing? N Y  Comment - bilateral hearing aids  Vision? N Y  Comment - retinopathy treatments  Difficulty concentrating or making decisions? Y Y  Comment forgetfullness at times trouble remembering, mild dementia  Walking or climbing stairs? Y Y  Comment - frequent falls, trouble  climbing stairs  Dressing or bathing? N Y  Comment - wife assist with bathing and dressing  Doing errands, shopping? N Y  Comment - wife and family Land and eating ? N Y  Comment - wife and family cooks  Using the Toilet? N Y  Comment - wife assist to bathroom  In the past six months, have you accidently leaked urine? N Y  Comment - urinary incontinence  Do you have problems with loss of bowel control? N Y  Comment - trouble controlling bowels  Managing your Medications? N Y  Comment - wife manages medications  Managing your Finances? N Y  Comment - wife and family pays bills  Housekeeping or managing your Housekeeping? N Y  Comment - wife and family cleans  Some recent data might be hidden    Fall/Depression Screening: Fall Risk  09/09/2019 07/11/2019 06/21/2019  Falls in the past year? '1 1 1  ' Comment - - -  Number falls in past yr: '1 1 1  ' Comment Last fall December 2020 - last fall December 2020  Injury with Fall? 0 0 0  Comment - minor -  Risk for fall due to : History of fall(s);Impaired balance/gait;Impaired mobility;Impaired vision;Medication side effect History of fall(s);Impaired balance/gait;Impaired mobility History of fall(s);Impaired balance/gait;Impaired mobility;Impaired vision;Medication side effect  Risk for fall due to: Comment - - -  Follow up Falls evaluation completed;Education provided;Falls prevention discussed Education provided;Falls prevention discussed;Falls evaluation completed Falls evaluation completed;Education provided;Falls prevention discussed   PHQ 2/9 Scores 07/11/2019 05/13/2019 04/03/2019 01/18/2019 01/14/2019 11/19/2018 09/25/2017  PHQ - 2 Score 0 - 0 1 1 0 0  PHQ- 9 Score - - - - - - 1  Exception Documentation - Other- indicate reason in comment box - - Other- indicate reason in comment box - -  Not completed - did not speak with patient, spoke with wife - - Spouse states patrient is being worked up for dementia and has memory issues  - -   THN CM Care Plan Problem One     Most Recent Value  Care Plan Problem One  Trouble managing diabetes with decreased appetite in patient and increasing confusion  Role Documenting the Problem One  Five Points for Problem One  Active  THN Long Term Goal   Wife will report decrease in Hgb A1C by 0.2 points within the next 90 days.  THN Long Term Goal Start Date  09/09/19  Interventions for Problem One Long Term Goal  Care plan and goals reviewed and discussed with patient, reviewed medications and encouraged medication compliance, reviewed blood sugar ranges and discussed ways to help reach goal or reduce, encouraged to continue to monitor blood sugars and to notify provider for elevations, encouraged to keep and attend scheduled medical appointments, fall precautions and preventions reviewed and discussed, discussed healthier food and drink  options, offered encouragement to wife for caring for patient     Appointments:  Attended last appointment with primary care provider, Oretha Caprice on 07/29/19.  Has scheduled appointment with Endocrinologist, Dr. Dwyane Dee on 09/23/2019.  Plan: [name] will send primary care provider quarterly update.   [name] will make next telephone outreach to patient within the month of July.  [place] [phone] [email or web address]

## 2019-09-11 ENCOUNTER — Other Ambulatory Visit: Payer: Self-pay | Admitting: Endocrinology

## 2019-09-13 ENCOUNTER — Ambulatory Visit (HOSPITAL_COMMUNITY)
Admission: RE | Admit: 2019-09-13 | Discharge: 2019-09-13 | Disposition: A | Discharge status: Home / Self Care | Payer: Medicare Other | Source: Ambulatory Visit | Attending: Nephrology | Admitting: Nephrology

## 2019-09-13 ENCOUNTER — Other Ambulatory Visit: Payer: Self-pay

## 2019-09-13 VITALS — BP 124/51 | HR 60 | Temp 96.4°F | Resp 20

## 2019-09-13 DIAGNOSIS — N184 Chronic kidney disease, stage 4 (severe): Secondary | ICD-10-CM

## 2019-09-13 DIAGNOSIS — D631 Anemia in chronic kidney disease: Secondary | ICD-10-CM | POA: Insufficient documentation

## 2019-09-13 LAB — POCT HEMOGLOBIN-HEMACUE: Hemoglobin: 9.7 g/dL — ABNORMAL LOW (ref 13.0–17.0)

## 2019-09-13 MED ORDER — DARBEPOETIN ALFA 40 MCG/0.4ML IJ SOSY: 40.0000 ug | PREFILLED_SYRINGE | INTRAMUSCULAR | Status: DC

## 2019-09-13 MED ORDER — DARBEPOETIN ALFA 40 MCG/0.4ML IJ SOSY
PREFILLED_SYRINGE | INTRAMUSCULAR | Status: AC
  Administered 2019-09-13: 40 ug via SUBCUTANEOUS
  Filled 2019-09-13: qty 0.4

## 2019-09-18 ENCOUNTER — Other Ambulatory Visit: Payer: Self-pay | Admitting: Endocrinology

## 2019-09-19 ENCOUNTER — Other Ambulatory Visit: Payer: Medicare Other

## 2019-09-19 ENCOUNTER — Other Ambulatory Visit (INDEPENDENT_AMBULATORY_CARE_PROVIDER_SITE_OTHER): Payer: Medicare Other

## 2019-09-19 DIAGNOSIS — E1165 Type 2 diabetes mellitus with hyperglycemia: Secondary | ICD-10-CM

## 2019-09-19 DIAGNOSIS — Z794 Long term (current) use of insulin: Secondary | ICD-10-CM | POA: Diagnosis not present

## 2019-09-19 LAB — BASIC METABOLIC PANEL
BUN: 56 mg/dL — ABNORMAL HIGH (ref 6–23)
CO2: 19 mEq/L (ref 19–32)
Calcium: 8.9 mg/dL (ref 8.4–10.5)
Chloride: 112 mEq/L (ref 96–112)
Creatinine, Ser: 2.5 mg/dL — ABNORMAL HIGH (ref 0.40–1.50)
GFR: 25.03 mL/min — ABNORMAL LOW (ref >60.00)
Glucose, Bld: 157 mg/dL — ABNORMAL HIGH (ref 70–99)
Potassium: 4.3 mEq/L (ref 3.5–5.1)
Sodium: 139 mEq/L (ref 135–145)

## 2019-09-19 LAB — HEMOGLOBIN A1C: Hgb A1c MFr Bld: 6.8 % — ABNORMAL HIGH (ref 4.6–6.5)

## 2019-09-20 DIAGNOSIS — E113312 Type 2 diabetes mellitus with moderate nonproliferative diabetic retinopathy with macular edema, left eye: Secondary | ICD-10-CM | POA: Diagnosis not present

## 2019-09-20 DIAGNOSIS — E113313 Type 2 diabetes mellitus with moderate nonproliferative diabetic retinopathy with macular edema, bilateral: Secondary | ICD-10-CM | POA: Diagnosis not present

## 2019-09-23 ENCOUNTER — Telehealth (INDEPENDENT_AMBULATORY_CARE_PROVIDER_SITE_OTHER): Payer: Medicare Other | Admitting: Endocrinology

## 2019-09-23 ENCOUNTER — Other Ambulatory Visit: Payer: Self-pay

## 2019-09-23 DIAGNOSIS — E1165 Type 2 diabetes mellitus with hyperglycemia: Secondary | ICD-10-CM

## 2019-09-23 DIAGNOSIS — E782 Mixed hyperlipidemia: Secondary | ICD-10-CM

## 2019-09-23 DIAGNOSIS — I639 Cerebral infarction, unspecified: Secondary | ICD-10-CM | POA: Diagnosis not present

## 2019-09-23 DIAGNOSIS — Z794 Long term (current) use of insulin: Secondary | ICD-10-CM

## 2019-09-23 DIAGNOSIS — N289 Disorder of kidney and ureter, unspecified: Secondary | ICD-10-CM

## 2019-09-23 NOTE — Progress Notes (Signed)
Patient ID: David [name], male   DOB: [birth year], 79 y.o.   MRN: [id number]  I connected with the above-named patient by video enabled telemedicine application and verified that I am speaking with the correct person. The patient was explained the limitations of evaluation and management by telemedicine and the availability of in person appointments.  Patient also understood that there may be a patient responsible charge related to this service . Location of the patient: Patient's home . Location of the provider: Physician office  the patient, his wife and myself were participating in the encounter The patient understood the above statements and agreed to proceed.    Reason for Appointment: Followup for Type 2 Diabetes   History of Present Illness:          Diagnosis: Type 2 diabetes mellitus, date of diagnosis:   1992       Past history:  He was initially treated with metformin and at some point also glipizide. Apparently he was started on insulin in 1994 approximately because of poor control He has been on various insulin regimens over the last several years However even with insulin he has had poor control for at least the last 7 or 8 years. He does not know what his previous A1c levels have been. He had been continued on metformin and glipizide but metformin stopped because of kidney function abnormality He had been taking Lantus 60 units twice a day with NovoLog previously and also Before his initial consultation in 6/15 he was on NovoLog twice a day and Humalog mix insulin  Because of poor control and large insulin doses he was started on Victoza in 6/15  Since 04/28/14 he had been on a Medtronic insulin pump because of persistent poor control and high insulin requirement  Recent history:   Non-insulin hypoglycemic drugs the patient is taking are: VICTOZA 1.8 mg daily  INSULIN regimen: Tresiba 44 units daily, NovoLog 10 units at brunch and 12 units at  dinner   Has had A1c's of mostly over 8%, now 6.8 compared to 8.3 %  Fructosamine : 329 last  Current management, problems and blood sugar patterns:  He has had significant anemia not clear if this is affecting his A1c result  His blood sugars are appearing to be somewhat higher than before although now checking blood sugars only before breakfast and dinnertime  Tyler Aas was reduced to 44 units because of low normal sugars before breakfast on last visit  He is still not able to exercise because of weight loss  However may have lost weight but this is not confirmed  He is getting some snacks at bedtime but only because of fear of hypoglycemia, sometimes will only eat fruit and other times will need half a peanut butter sandwich  Also in the afternoon he will sometimes have only a snack like fruit or half peanut butter sandwich  Only once had a blood sugar below 70 in the morning  Not clear if his sugars are higher after breakfast or dinner, meter not downloaded and averages not available  He is eating a late breakfast table usually no lunch  Recent blood sugars from his meter by home meter reviewed:   PRE-MEAL Fasting Lunch Dinner Bedtime Overall  Glucose range: 63-178  136-187    Mean/median:        Previous readings:  PRE-MEAL Fasting Lunch Dinner Bedtime Overall  Glucose range:  81-118      Mean/median:     ?  POST-MEAL PC Breakfast PC Lunch PC Dinner  Glucose range:   160-171  118-229  Mean/median:        Self-care:   Meals: 2- 3 meals per day.   breakfast is cheerios or egg and toast.  Will have half sandwich with soup at 1 pm , dinner at  6-7 pm.     Bedtime snack is usually crackers  with milk or have been admitted sandwich          Last  consultation : Most recent: 08/2017      Wt Readings from Last 3 Encounters:  07/29/19 280 lb (127 kg)  06/26/19 282 lb 3.2 oz (128 kg)  05/29/19 264 lb (119.7 kg)   Glycemic control:   Lab Results  Component  Value Date   HGBA1C 6.8 (H) 09/19/2019   HGBA1C 8.3 (H) 03/01/2019   HGBA1C 7.4 (H) 01/06/2019   Lab Results  Component Value Date   MICROALBUR 7.3 (H) 03/05/2019   LDLCALC 73 01/06/2019   CREATININE 2.50 (H) 09/19/2019            Lab Results  Component Value Date   FRUCTOSAMINE 329 (H) 05/02/2019   FRUCTOSAMINE 295 (H) 12/28/2018   FRUCTOSAMINE 316 (H) 07/17/2018   FRUCTOSAMINE 319 (H) 12/27/2017     Other active problems: See review of systems   Allergies as of 09/23/2019      Reactions   Ativan [lorazepam] Anxiety   Pt gets more agitated   Adhesive [tape] Other (See Comments)   blisters      Medication List       Accurate as of Sep 23, 2019  8:26 AM. If you have any questions, ask your nurse or doctor.        acetaminophen 325 MG tablet Commonly known as: TYLENOL Take 1-2 tablets (325-650 mg total) by mouth every 4 (four) hours as needed for mild pain.   amLODipine 5 MG tablet Commonly known as: NORVASC Take 1 tablet (5 mg total) by mouth daily.   atorvastatin 10 MG tablet Commonly known as: LIPITOR TAKE 1 TABLET BY MOUTH ONCE DAILY AT  6PM   blood glucose meter kit and supplies Kit Dispense based on patient and insurance preference. Use up to four times daily as directed. (FOR ICD-9 250.00, 250.01).   calcitRIOL 0.25 MCG capsule Commonly known as: ROCALTROL Take 1 capsule by mouth once daily   carvedilol 25 MG tablet Commonly known as: COREG TAKE 1 TABLET BY MOUTH TWICE DAILY WITH A MEAL   cholecalciferol 1000 units tablet Commonly known as: VITAMIN D Take 1 tablet (1,000 Units total) by mouth daily.   cholestyramine light 4 g packet Commonly known as: Prevalite Take 1 packet (4 g total) by mouth 2 (two) times daily.   clopidogrel 75 MG tablet Commonly known as: PLAVIX Take 1 tablet (75 mg total) by mouth daily.   diclofenac sodium 1 % Gel Commonly known as: VOLTAREN Apply 2 g topically 4 (four) times daily.   Fish Oil 1000 MG Caps Take  1 capsule (1,000 mg total) by mouth every morning.   folic acid 1 MG tablet Commonly known as: FOLVITE Take 1 mg by mouth daily.   glucose blood test strip Use Contour test strips as instructed to check blood sugar four times daily.   hydrALAZINE 100 MG tablet Commonly known as: APRESOLINE Take 1 tablet (100 mg total) by mouth 2 (two) times daily.   INSULIN SYRINGE 1CC/31GX5/16" 31G X 5/16" 1 ML Misc 1  each by Does not apply route 3 (three) times daily. Use insulin syringe to inject insulin three times daily.   isosorbide mononitrate 30 MG 24 hr tablet Commonly known as: IMDUR Take 2 tablets by mouth once daily   mupirocin ointment 2 % Commonly known as: BACTROBAN Apply to left 3rd digit once daily.   NovoLOG FlexPen 100 UNIT/ML FlexPen Generic drug: insulin aspart Inject 10 units under the skin before the first meal of the day and 12 units at dinner.   pantoprazole 40 MG tablet Commonly known as: PROTONIX Take 1 tablet by mouth once daily   sertraline 100 MG tablet Commonly known as: ZOLOFT Take 1 tablet (100 mg total) by mouth daily.   torsemide 20 MG tablet Commonly known as: DEMADEX Take 2 tablets (72m) by mouth every OTHER day.   TTyler AasFlexTouch 200 UNIT/ML FlexTouch Pen Generic drug: insulin degludec INJECT 44 UNITS SUBCUTANEOUSLY ONCE DAILY   Victoza 18 MG/3ML Sopn Generic drug: liraglutide INJECT 0.3ML (1.8MG TOTAL) INTO THE SKIN DAILY BEFORE SUPPER   vitamin B-12 100 MCG tablet Commonly known as: CYANOCOBALAMIN Take 100 mcg by mouth daily.       Allergies:  Allergies  Allergen Reactions  . Ativan [Lorazepam] Anxiety    Pt gets more agitated  . Adhesive [Tape] Other (See Comments)    blisters    Past Medical History:  Diagnosis Date  . Acute cholecystitis s/p lap cholecystectomy 03/05/2017 03/04/2017  . Anemia, iron deficiency   . Anxiety   . Arthritis   . Atrial fibrillation with rapid ventricular response ([facility]   . BPH (benign  prostatic hypertrophy)   . CAD (coronary artery disease)    Nonobstructive CAD per cath  . Cardiac pacemaker in situ   . CHF (congestive heart failure) ([facility]   . Chronic ulcer of right foot ([facility] [place]   . CKD (chronic kidney disease) stage 4, GFR 15-29 ml/min (HCC) 09/26/2017  . 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 Coronary artery disease involving native coronary artery of native heart without angina pectoris   . Diastolic dysfunction   . Dyspnea   . 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 History of cellulitis    right great toe 10-25-2014  . History of skin cancer   . HOCM (hypertrophic obstructive cardiomyopathy) ([facility]   . Hyperlipidemia associated with type 2 diabetes mellitus ([facility] 10/25/2013  . Hypertension   . Hypertension associated with diabetes ([facility] 10/25/2013  . 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 Insulin dependent type 2 diabetes mellitus ([facility] 1991   followd by dr kDwyane Dee-  has insulin pump  . Insulin pump in place   . OSA on CPAP   . 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 Peripheral neuropathy    severe  . Peripheral vascular disease ([facility]    bilateral lower extremities  . PVD (peripheral vascular disease) ([facility]   . Rib fracture 07/24/2015  . Seasonal and perennial allergic rhinitis 10/25/2013  . Seborrheic keratosis 09/19/2016  . Secondary hyperparathyroidism of renal origin ([facility]   . 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 Sinus node dysfunction (HCC)   . Sleep apnea   . Syncope 07/24/2015    Past Surgical History:  Procedure Laterality Date  . AMPUTATION OF REPLICATED TOES  Mar 24650  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)  . AMPUTATION TOE Right 03/12/2015   Procedure: RIGHT HALLUS AMPUTATION ;  Surgeon: [name], MD;  [facility];  Service: Podiatry;  Laterality: Right;  . CARDIAC CATHETERIZATION  11-25-2010   [place],  [place]   Nonobstructive CAD  . CARDIAC PACEMAKER PLACEMENT  Nov 2009   [employer]tr[employer]  . CHOLECYSTECTOMY N/A 03/05/2017   Procedure: LAPAROSCOPIC CHOLECYSTECTOMY WITH INTRAOPERATIVE CHOLANGIOGRAM;  Surgeon: [name], MD;  Location: [facility];  Service: General;  Laterality: N/A;  . EP IMPLANTABLE DEVICE N/A 06/03/2015   Procedure: PPM Generator Changeout;  Surgeon: [name], MD;  Location: [facility];  Service: Cardiovascular;  Laterality: N/A;  . EXCISION BONE CYST Right 03/06/2015   Procedure: BONE BIOPSIES OF RIGHT FOOT;  Surgeon: [name], MD;  Location: [facility];  Service: Podiatry;  Laterality: Right;  . ORIF ANKLE FRACTURE Left 11/06/2014   Procedure: OPEN REDUCTION INTERNAL FIXATION (ORIF) LEFT  ANKLE FRACTURE;  Surgeon: [name], MD;  Location: [facility];  Service: Orthopedics;  Laterality: Left;  . TEE WITHOUT CARDIOVERSION N/A 09/01/2017   Procedure: TRANSESOPHAGEAL ECHOCARDIOGRAM (TEE);  Surgeon: [name], MD;  Location: [facility];  Service: Cardiovascular;  Laterality: N/A;  . TOTAL KNEE ARTHROPLASTY    . VEIN LIGATION AND STRIPPING      Family History  Problem Relation Age of Onset  . Cancer Mother        breast  . Heart attack Father   . Stroke Father     Social History:  reports that he quit smoking about 35 years ago. He has a 60.00 pack-year smoking history. He has never used smokeless tobacco. He reports current alcohol use of about 1.0 standard drinks of alcohol per week. He reports that he does not use drugs.    Review of Systems         Lipids: Had been on and off Lipitor since late 2014 and is now taking 10 mg.   LDL is below 70 Previously had high ALT with Lipitor  HDL is consistently low  He is taking OTC  fish oil  Also taking cholestyramine for other reasons and triglycerides are relatively high   Lab Results  Component Value Date   CHOL 108 03/01/2019   CHOL 138 01/06/2019   CHOL 122  07/17/2018   Lab Results  Component Value Date   HDL 22.10 (L) 03/01/2019   HDL 23 (L) 01/06/2019   HDL 25.80 (L) 07/17/2018   Lab Results  Component Value Date   LDLCALC 73 01/06/2019   LDLCALC 62 07/17/2018   LDLCALC 106 (H) 08/31/2017   Lab Results  Component Value Date   TRIG 255.0 (H) 03/01/2019   TRIG 210 (H) 01/06/2019   TRIG 168.0 (H) 07/17/2018   Lab Results  Component Value Date   CHOLHDL 5 03/01/2019   CHOLHDL 6.0 01/06/2019   CHOLHDL 5 07/17/2018   Lab Results  Component Value Date   LDLDIRECT 37.0 03/01/2019   LDLDIRECT 50.0 12/27/2017   LDLDIRECT 76.0 07/21/2017    Lab Results  Component Value Date   ALT 14 03/01/2019       HYPERTENSION: The blood pressure has been treated with various drugs including carvedilol 25 mg by his other physicians.    BP Readings from Last 3 Encounters:  09/13/19 (!) 124/51  08/16/19 (!) 153/51  07/29/19 (!) 150/50     Chronic kidney disease: Has variable results, is followed by nephrologist  Creatinine ranges:  Lab Results  Component Value Date   CREATININE 2.50 (H) 09/19/2019  CREATININE 2.23 (H) 03/01/2019   CREATININE 1.89 (H) 01/12/2019         Has history of Numbness in his feet  since about 2013  He has been prescribed diabetic shoes He is  under the care of a podiatrist on a regular basis  Vitamin B12 deficiency: managed with monthly injections  Lab Results  Component Value Date   VITAMINB12 500 03/06/2019   Continues to get treatment for anemia of chronic disease, now on ESA injections Was getting iron infusions for iron deficiency    Physical Examination:  There were no vitals taken for this visit.    ASSESSMENT:  Diabetes type 2, with obesity See history of present illness for detailed discussion of current diabetes management, blood sugar patterns and problems identified  He is on basal bolus insulin and Victoza   Although A1c is 6.8 this is likely falsely low because of his  anemia  He is fairly consistent with taking his insulin doses as directed Also generally blood sugars are appearing fairly well controlled considering his age and comorbid conditions No hypoglycemia also except 1 reading of 63, asymptomatic  However his blood sugar patterns are somewhat inconsistent as before and will have higher readings before dinnertime based on his diet and snacks Currently not checking readings after meals at all  Diabetic neuropathy: Symptomatically doing well  Hyperlipidemia: Needs follow-up labs, last LDL 73 with high triglycerides   PLAN:   In office follow-up on the next visit  He will take 5 units of NovoLog insulin to cover any afternoon snacks that have carbohydrates such as sandwiches  May have more of a protein snack at bedtime instead of fruit  Start checking blood sugars after dinner around 8-10 PM rather than before eating  May need to adjust suppertime dose based on readings after meals  Also periodically check readings after breakfast, may not need to check every morning  Recommend that he bring his monitor for download for more detail review of blood sugar patterns  No change in [place] as yet  Avoid high-fat meals  Start walking on treadmill or other exercise when able to  No change in Victoza   There are no Patient Instructions on file for this visit.      Elayne Snare 09/23/2019, 8:26 AM   Note: This office note was prepared with Dragon voice recognition system technology. Any transcriptional errors that result from this process are unintentional.

## 2019-10-01 ENCOUNTER — Encounter: Payer: Self-pay | Admitting: Family Medicine

## 2019-10-01 DIAGNOSIS — I152 Hypertension secondary to endocrine disorders: Secondary | ICD-10-CM

## 2019-10-01 DIAGNOSIS — E1159 Type 2 diabetes mellitus with other circulatory complications: Secondary | ICD-10-CM

## 2019-10-01 MED ORDER — AMLODIPINE BESYLATE 5 MG PO TABS: 5.0000 mg | ORAL_TABLET | Freq: Every day | ORAL | 1 refills | Status: DC

## 2019-10-11 ENCOUNTER — Other Ambulatory Visit: Payer: Self-pay

## 2019-10-11 ENCOUNTER — Ambulatory Visit (HOSPITAL_COMMUNITY)
Admission: RE | Admit: 2019-10-11 | Discharge: 2019-10-11 | Disposition: A | Discharge status: Home / Self Care | Payer: Medicare Other | Source: Ambulatory Visit | Attending: Nephrology | Admitting: Nephrology

## 2019-10-11 VITALS — BP 124/45 | HR 60 | Resp 18

## 2019-10-11 DIAGNOSIS — N184 Chronic kidney disease, stage 4 (severe): Secondary | ICD-10-CM | POA: Insufficient documentation

## 2019-10-11 DIAGNOSIS — D631 Anemia in chronic kidney disease: Secondary | ICD-10-CM | POA: Diagnosis not present

## 2019-10-11 LAB — RENAL FUNCTION PANEL
Albumin: 3.3 g/dL — ABNORMAL LOW (ref 3.5–5.0)
Anion gap: 11 (ref 5–15)
BUN: 52 mg/dL — ABNORMAL HIGH (ref 8–23)
CO2: 18 mmol/L — ABNORMAL LOW (ref 22–32)
Calcium: 8.6 mg/dL — ABNORMAL LOW (ref 8.9–10.3)
Chloride: 113 mmol/L — ABNORMAL HIGH (ref 98–111)
Creatinine, Ser: 2.62 mg/dL — ABNORMAL HIGH (ref 0.61–1.24)
GFR calc Af Amer: 26 mL/min — ABNORMAL LOW (ref >60)
GFR calc non Af Amer: 22 mL/min — ABNORMAL LOW (ref >60)
Glucose, Bld: 75 mg/dL (ref 70–99)
Phosphorus: 4.3 mg/dL (ref 2.5–4.6)
Potassium: 4.1 mmol/L (ref 3.5–5.1)
Sodium: 142 mmol/L (ref 135–145)

## 2019-10-11 LAB — POCT HEMOGLOBIN-HEMACUE: Hemoglobin: 9.9 g/dL — ABNORMAL LOW (ref 13.0–17.0)

## 2019-10-11 MED ORDER — DARBEPOETIN ALFA 40 MCG/0.4ML IJ SOSY: 40.0000 ug | PREFILLED_SYRINGE | INTRAMUSCULAR | Status: DC

## 2019-10-11 MED ORDER — DARBEPOETIN ALFA 40 MCG/0.4ML IJ SOSY
PREFILLED_SYRINGE | INTRAMUSCULAR | Status: AC
  Administered 2019-10-11: 40 ug via SUBCUTANEOUS
  Filled 2019-10-11: qty 0.4

## 2019-10-15 ENCOUNTER — Encounter: Payer: Self-pay | Admitting: Family Medicine

## 2019-10-15 ENCOUNTER — Other Ambulatory Visit: Payer: Self-pay

## 2019-10-15 MED ORDER — CLOPIDOGREL BISULFATE 75 MG PO TABS: 75.0000 mg | ORAL_TABLET | Freq: Every day | ORAL | 0 refills | Status: DC

## 2019-10-15 MED ORDER — CARVEDILOL 25 MG PO TABS: ORAL_TABLET | ORAL | 0 refills | Status: DC

## 2019-10-15 MED ORDER — PANTOPRAZOLE SODIUM 40 MG PO TBEC: 40.0000 mg | DELAYED_RELEASE_TABLET | Freq: Every day | ORAL | 0 refills | Status: DC

## 2019-10-16 ENCOUNTER — Other Ambulatory Visit: Payer: Self-pay

## 2019-10-16 DIAGNOSIS — E1165 Type 2 diabetes mellitus with hyperglycemia: Secondary | ICD-10-CM

## 2019-10-16 DIAGNOSIS — Z794 Long term (current) use of insulin: Secondary | ICD-10-CM

## 2019-10-16 MED ORDER — TRESIBA FLEXTOUCH 200 UNIT/ML ~~LOC~~ SOPN: PEN_INJECTOR | SUBCUTANEOUS | 2 refills | Status: DC

## 2019-10-16 NOTE — Progress Notes (Signed)
rx refill for David [name] sent to the pharmacy

## 2019-10-21 ENCOUNTER — Other Ambulatory Visit: Payer: Self-pay

## 2019-10-21 DIAGNOSIS — Z794 Long term (current) use of insulin: Secondary | ICD-10-CM

## 2019-10-21 DIAGNOSIS — E1165 Type 2 diabetes mellitus with hyperglycemia: Secondary | ICD-10-CM

## 2019-10-21 MED ORDER — GLUCOSE BLOOD VI STRP: ORAL_STRIP | 2 refills | Status: DC

## 2019-10-25 ENCOUNTER — Other Ambulatory Visit: Payer: Self-pay

## 2019-10-25 DIAGNOSIS — Z794 Long term (current) use of insulin: Secondary | ICD-10-CM

## 2019-10-25 DIAGNOSIS — E1165 Type 2 diabetes mellitus with hyperglycemia: Secondary | ICD-10-CM

## 2019-10-25 MED ORDER — GLUCOSE BLOOD VI STRP
ORAL_STRIP | 2 refills | Status: AC
Start: 2019-10-25 — End: ?

## 2019-10-28 ENCOUNTER — Other Ambulatory Visit: Payer: Self-pay | Admitting: *Deleted

## 2019-10-28 ENCOUNTER — Encounter: Payer: Self-pay | Admitting: Family Medicine

## 2019-10-28 DIAGNOSIS — E113312 Type 2 diabetes mellitus with moderate nonproliferative diabetic retinopathy with macular edema, left eye: Secondary | ICD-10-CM | POA: Diagnosis not present

## 2019-10-28 DIAGNOSIS — E113313 Type 2 diabetes mellitus with moderate nonproliferative diabetic retinopathy with macular edema, bilateral: Secondary | ICD-10-CM | POA: Diagnosis not present

## 2019-10-28 MED ORDER — CHOLESTYRAMINE LIGHT 4 G PO PACK: 4.0000 g | PACK | Freq: Two times a day (BID) | ORAL | 5 refills | Status: DC

## 2019-11-04 ENCOUNTER — Encounter: Payer: Self-pay | Admitting: Physician Assistant

## 2019-11-04 ENCOUNTER — Other Ambulatory Visit: Payer: Self-pay

## 2019-11-04 ENCOUNTER — Ambulatory Visit (INDEPENDENT_AMBULATORY_CARE_PROVIDER_SITE_OTHER): Payer: Medicare Other | Admitting: Physician Assistant

## 2019-11-04 VITALS — BP 124/50 | HR 61 | Temp 98.2°F | Ht 76.0 in | Wt 277.4 lb

## 2019-11-04 DIAGNOSIS — M25562 Pain in left knee: Secondary | ICD-10-CM | POA: Diagnosis not present

## 2019-11-04 NOTE — Progress Notes (Signed)
David [name] is a 79 y.o. male here for a knee pain.  I acted as a Education administrator for [employer], PA-C [employer], [place]  History of Present Illness:   Chief Complaint  Patient presents with  . Knee Pain    HPI   Knee pain Pt c/o of left knee pain. Woke up about a week ago with soreness to lateral portion -- denies any inciting event. Pain was 10/10 at first. Taking Tylenol which helps relief pain. Patient iced knee, elevated. Left knee is slightly swollen. Symptoms are improving each day, has increased mobility each day.   Denies: unusual lower leg swelling, numbness/tingling to knee or legs, calf pain  Hx of R TKA.  Has hx of xray of L knee, 09/04/17  IMPRESSION: -Marked medial tibiofemoral joint degenerative changes. -Mild patellofemoral joint degenerative changes. -Small to moderate-size suprapatellar joint effusion.  Electronically Signed   By: [name]   On: 09/04/2017 17:55   Past Medical History:  Diagnosis Date  . Acute cholecystitis s/p lap cholecystectomy 03/05/2017 03/04/2017  . Anemia, iron deficiency   . Anxiety   . Arthritis   . Atrial fibrillation with rapid ventricular response ([place])   . BPH (benign prostatic hypertrophy)   . CAD (coronary artery disease)    Nonobstructive CAD per cath  . Cardiac pacemaker in situ   . CHF (congestive heart failure) ([place])   . Chronic ulcer of right foot ([place])   . CKD (chronic kidney disease) stage 4, GFR 15-29 ml/min (HCC) 09/26/2017  . 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 Coronary artery disease involving native coronary artery of native heart without angina pectoris   . Diastolic dysfunction   . Dyspnea   . 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 History of cellulitis    right great toe 10-25-2014  . History of skin cancer   . HOCM (hypertrophic obstructive cardiomyopathy) ([place])   . Hyperlipidemia  associated with type 2 diabetes mellitus ([place]) 10/25/2013  . Hypertension   . Hypertension associated with diabetes ([place]) 10/25/2013  . 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 Insulin pump in place   . OSA on CPAP   . 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 Peripheral neuropathy    severe  . Peripheral vascular disease ([place])    bilateral lower extremities  . PVD (peripheral vascular disease) ([place])   . Rib fracture 07/24/2015  . Seasonal and perennial allergic rhinitis 10/25/2013  . Seborrheic keratosis 09/19/2016  . Secondary hyperparathyroidism of renal origin ([name])   . 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 Sinus node dysfunction (HCC)   . Sleep apnea   . Syncope 07/24/2015     Social History   Tobacco Use  . Smoking status: Former Smoker    Packs/day: 2.00    Years: 30.00    Pack years: 60.00    Quit date: 03/03/1984    Years since quitting: 35.6  . Smokeless tobacco: Never Used  Vaping Use  . Vaping Use: Never used  Substance Use Topics  . Alcohol use: Yes    Alcohol/week: 1.0 standard drink    Types: 1 Glasses of wine per week    Comment: social  . Drug use: No    Past Surgical History:  Procedure Laterality Date  . 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 AMPUTATION TOE Right 03/12/2015   Procedure: RIGHT HALLUS AMPUTATION ;  Surgeon: [name], MD;  Location: [facility];  Service: Podiatry;  Laterality: Right;  . CARDIAC CATHETERIZATION  11-25-2010   [place], [place]   Nonobstructive CAD  . CARDIAC PACEMAKER PLACEMENT  Nov 2009   [employer]  . CHOLECYSTECTOMY N/A 03/05/2017   Procedure: LAPAROSCOPIC CHOLECYSTECTOMY WITH INTRAOPERATIVE CHOLANGIOGRAM;  Surgeon: [name], MD;  Location: [facility];  Service: General;  Laterality: N/A;  . EP IMPLANTABLE DEVICE N/A 06/03/2015   Procedure: PPM Generator  Changeout;  Surgeon: [name], MD;  Location: [facility];  Service: Cardiovascular;  Laterality: N/A;  . EXCISION BONE CYST Right 03/06/2015   Procedure: BONE BIOPSIES OF RIGHT FOOT;  Surgeon: [name], MD;  Location: [facility];  Service: Podiatry;  Laterality: Right;  . ORIF ANKLE FRACTURE Left 11/06/2014   Procedure: OPEN REDUCTION INTERNAL FIXATION (ORIF) LEFT  ANKLE FRACTURE;  Surgeon: [name], MD;  Location: [facility];  Service: Orthopedics;  Laterality: Left;  . TEE WITHOUT CARDIOVERSION N/A 09/01/2017   Procedure: TRANSESOPHAGEAL ECHOCARDIOGRAM (TEE);  Surgeon: [name], MD;  Location: [facility];  Service: Cardiovascular;  Laterality: N/A;  . TOTAL KNEE ARTHROPLASTY    . VEIN LIGATION AND STRIPPING      Family History  Problem Relation Age of Onset  . Cancer Mother        breast  . Heart attack Father   . Stroke Father     Allergies  Allergen Reactions  . Ativan [Lorazepam] Anxiety    Pt gets more agitated  . Adhesive [Tape] Other (See Comments)    blisters    Current Medications:   Current Outpatient Medications:  .  acetaminophen (TYLENOL) 325 MG tablet, Take 1-2 tablets (325-650 mg total) by mouth every 4 (four) hours as needed for mild pain., Disp: , Rfl:  .  amLODipine (NORVASC) 5 MG tablet, Take 1 tablet (5 mg total) by mouth daily., Disp: 90 tablet, Rfl: 1 .  atorvastatin (LIPITOR) 10 MG tablet, TAKE 1 TABLET BY MOUTH ONCE DAILY AT  6PM, Disp: 90 tablet, Rfl: 1 .  blood glucose meter kit and supplies KIT, Dispense based on patient and insurance preference. Use up to four times daily as directed. (FOR ICD-9 250.00, 250.01)., Disp: 1 each, Rfl: 0 .  calcitRIOL (ROCALTROL) 0.25 MCG capsule, Take 1 capsule by mouth once daily, Disp: 90 capsule, Rfl: 0 .  carvedilol (COREG) 25 MG tablet, TAKE 1 TABLET BY MOUTH TWICE DAILY WITH A MEAL, Disp: 180 tablet, Rfl: 0 .  cholecalciferol (VITAMIN D) 1000 units tablet, Take  1 tablet (1,000 Units total) by mouth daily., Disp: 30 tablet, Rfl: 1 .  cholestyramine light (PREVALITE) 4 g packet, Take 1 packet (4 g total) by mouth 2 (two) times daily., Disp: 60 each, Rfl: 5 .  clopidogrel (PLAVIX) 75 MG tablet, Take 1 tablet (75 mg total) by mouth daily., Disp: 90 tablet, Rfl: 0 .  diclofenac sodium (VOLTAREN) 1 % GEL, Apply 2 g topically 4 (four) times daily., Disp: 354 g, Rfl: 0 .  folic acid (FOLVITE) 1 MG tablet, Take 1 mg by mouth daily., Disp: , Rfl:  .  glucose blood test strip, Use Contour test strips as instructed to check blood sugar four times daily., Disp: 200 each, Rfl: 2 .  insulin  degludec (TRESIBA FLEXTOUCH) 200 UNIT/ML FlexTouch Pen, INJECT 44 UNITS SUBCUTANEOUSLY ONCE DAILY, Disp: 9 mL, Rfl: 2 .  Insulin Syringe-Needle U-100 (INSULIN SYRINGE 1CC/31GX5/16") 31G X 5/16" 1 ML MISC, 1 each by Does not apply route 3 (three) times daily. Use insulin syringe to inject insulin three times daily., Disp: 100 each, Rfl: 2 .  isosorbide mononitrate (IMDUR) 30 MG 24 hr tablet, Take 2 tablets by mouth once daily, Disp: 180 tablet, Rfl: 3 .  mupirocin ointment (BACTROBAN) 2 %, Apply to left 3rd digit once daily., Disp: 30 g, Rfl: 1 .  NOVOLOG FLEXPEN 100 UNIT/ML FlexPen, Inject 10 units under the skin before the first meal of the day and 12 units at dinner., Disp: 15 mL, Rfl: 1 .  Omega-3 Fatty Acids (FISH OIL) 1000 MG CAPS, Take 1 capsule (1,000 mg total) by mouth every morning., Disp: 30 capsule, Rfl: 1 .  pantoprazole (PROTONIX) 40 MG tablet, Take 1 tablet (40 mg total) by mouth daily., Disp: 90 tablet, Rfl: 0 .  torsemide (DEMADEX) 20 MG tablet, Take 2 tablets (37m) by mouth every OTHER day., Disp: 180 tablet, Rfl: 3 .  VICTOZA 18 MG/3ML SOPN, INJECT 0.3ML (1.8MG TOTAL) INTO THE SKIN DAILY BEFORE SUPPER, Disp: 9 mL, Rfl: 2 .  vitamin B-12 (CYANOCOBALAMIN) 100 MCG tablet, Take 100 mcg by mouth daily., Disp: , Rfl:  .  hydrALAZINE (APRESOLINE) 100 MG tablet, Take 1  tablet (100 mg total) by mouth 2 (two) times daily., Disp: 180 tablet, Rfl: 0 .  sertraline (ZOLOFT) 100 MG tablet, Take 1 tablet (100 mg total) by mouth daily., Disp: 90 tablet, Rfl: 1   Review of Systems:   ROS  Negative unless otherwise specified per HPI.  Vitals:   Vitals:   11/04/19 1537  BP: (!) 124/50  Pulse: 61  Temp: 98.2 F (36.8 C)  TempSrc: Temporal  SpO2: 97%  Weight: 277 lb 6.4 oz (125.8 kg)  Height: '6\' 4"'  (1.93 m)     Body mass index is 33.77 kg/m.  Physical Exam:   Physical Exam Vitals and nursing note reviewed.  Constitutional:      Appearance: He is well-developed.  HENT:     Head: Normocephalic.  Eyes:     Conjunctiva/sclera: Conjunctivae normal.     Pupils: Pupils are equal, round, and reactive to light.  Pulmonary:     Effort: Pulmonary effort is normal.  Musculoskeletal:        General: Normal range of motion.     Cervical back: Normal range of motion.     Comments: Pain to palpation of L lateral knee with slight swelling Limited ROM at baseline No calf swelling/TTP  Skin:    General: Skin is warm and dry.  Neurological:     Mental Status: He is alert and oriented to person, place, and time.  Psychiatric:        Behavior: Behavior normal.        Thought Content: Thought content normal.        Judgment: Judgment normal.       Assessment and Plan:   DBrahmwas seen today for knee pain.  Diagnoses and all orders for this visit:  Acute pain of left knee -     Ambulatory referral to Sports Medicine   No red flags on exam. Suspect arthritis, possible effusion today? Symptoms improving with time. Will continue conservative treatment and refer to sports medicine. May need PT. Patient and wife agreeable to plan.  . Reviewed expectations  re: course of current medical issues. . Discussed self-management of symptoms. . Outlined signs and symptoms indicating need for more acute intervention. . Patient verbalized understanding and all  questions were answered. . See orders for this visit as documented in the electronic medical record. . Patient received an After-Visit Summary.  CMA or LPN served as scribe during this visit. History, Physical, and Plan performed by medical provider. The above documentation has been reviewed and is accurate and complete.  Inda Coke, PA-C

## 2019-11-04 NOTE — Patient Instructions (Signed)
It was great to see you!  You will be contacted about your referral to our sports medicine doctor.  Take care,  Inda Coke PA-C

## 2019-11-07 ENCOUNTER — Encounter: Payer: Self-pay | Admitting: Family Medicine

## 2019-11-07 ENCOUNTER — Ambulatory Visit: Payer: Self-pay

## 2019-11-07 ENCOUNTER — Ambulatory Visit (INDEPENDENT_AMBULATORY_CARE_PROVIDER_SITE_OTHER): Payer: Medicare Other | Admitting: Family Medicine

## 2019-11-07 ENCOUNTER — Other Ambulatory Visit: Payer: Self-pay

## 2019-11-07 VITALS — BP 126/60 | HR 64 | Ht 76.0 in | Wt 277.4 lb

## 2019-11-07 DIAGNOSIS — G8929 Other chronic pain: Secondary | ICD-10-CM

## 2019-11-07 DIAGNOSIS — M25562 Pain in left knee: Secondary | ICD-10-CM

## 2019-11-07 DIAGNOSIS — I639 Cerebral infarction, unspecified: Secondary | ICD-10-CM

## 2019-11-07 NOTE — Progress Notes (Signed)
Subjective:    CC: L lateral knee pain  I, Molly Weber, LAT, ATC, am serving as scribe for Dr. Lynne Leader.  HPI: Pt is a 79 y/o male presenting w/ c/o chronic L lateral knee pain that worsened over the last week w/ no known MOI.  History of stroke and heart disease.  Radiating pain: yes into the lower leg L knee swelling: yes L knee mechanical symptoms: no Aggravating factors: weight bearing and walking Treatments tried: RICE, Tylenol; Voltaren  Diagnostic testing: L knee XR- 4/22/219  Pertinent review of Systems: No fevers or chills  Relevant historical information: Heart disease stroke diabetes CKD   Objective:    Vitals:   11/07/19 1300  BP: 126/60  Pulse: 64  SpO2: 97%   General: Well Developed, well nourished, and in no acute distress.   MSK: Left knee moderate effusion.  No erythema or induration. Range of motion 5-100 degrees. Tender palpation medial and lateral joint line. Stable ligamentous exam.  Bilateral lower extremities pitting edema with darkened erythematous skin consistent with venous stasis dermatitis.   Lab and Radiology Results  EXAM: LEFT KNEE - 1-2 VIEW  COMPARISON:  None.  FINDINGS: Marked medial tibiofemoral joint space narrowing and degenerative changes.  Mild patellofemoral joint degenerative changes.  Small to moderate-size suprapatellar joint effusion.  No fracture or dislocation.  Vascular calcifications.  IMPRESSION: Marked medial tibiofemoral joint degenerative changes.  Mild patellofemoral joint degenerative changes.  Small to moderate-size suprapatellar joint effusion.   Electronically Signed   By: [name]   On: 09/04/2017 17:55   Procedure: Real-time Ultrasound Guided Injection of left knee superior lateral patellar space Device: Philips Affiniti 50G Images permanently stored and available for review in the ultrasound unit. Verbal informed consent obtained.  Discussed risks  and benefits of procedure. Warned about infection bleeding damage to structures skin hypopigmentation and fat atrophy among others. Patient expresses understanding and agreement Time-out conducted.   Noted no overlying erythema, induration, or other signs of local infection.   Skin prepped in a sterile fashion.   Local anesthesia: Topical Ethyl chloride.   With sterile technique and under real time ultrasound guidance:  40 mg of Kenalog and 2 mL of Marcaine injected easily.   Completed without difficulty   Pain immediately resolved suggesting accurate placement of the medication.   Advised to call if fevers/chills, erythema, induration, drainage, or persistent bleeding.   Images permanently stored and available for review in the ultrasound unit.  Impression: Technically successful ultrasound guided injection.    Lab Results  Component Value Date   HGBA1C 6.8 (H) 09/19/2019      Impression and Recommendations:    Assessment and Plan: 79 y.o. male with left knee pain today.  Associate with large joint effusion and history of significant DJD.  Plan for steroid injection and continued Voltaren gel.  Feel steroid injection is reasonable especially as diabetes is now pretty well controlled with A1c 6.8 in May.  Would consider hyaluronic acid injections in the future as well if needed.  Patient ultimately would benefit orthopedically from a total knee replacement but I do not think his health and overall functional capacity would support that.  We will continue conservative management as long as possible.Marland Kitchen  PDMP not reviewed this encounter. Orders Placed This Encounter  Procedures  . [name] LIMITED JOINT SPACE STRUCTURES LOW LEFT(NO LINKED CHARGES)    Order Specific Question:   Reason for Exam (SYMPTOM  OR DIAGNOSIS REQUIRED)    Answer:  L knee pain    Order Specific Question:   Preferred imaging location?    Answer:   [facility]   No orders of the defined types  were placed in this encounter.   Discussed warning signs or symptoms. Please see discharge instructions. Patient expresses understanding.   The above documentation has been reviewed and is accurate and complete Lynne Leader, [name]

## 2019-11-07 NOTE — Patient Instructions (Signed)
Thank you for coming in today.  Call or go to the ER if you develop a large red swollen joint with extreme pain or oozing puss.  Use the voltaren gel.  Recheck as needed.

## 2019-11-08 ENCOUNTER — Ambulatory Visit (HOSPITAL_COMMUNITY)
Admission: RE | Admit: 2019-11-08 | Discharge: 2019-11-08 | Disposition: A | Discharge status: Home / Self Care | Payer: Medicare Other | Source: Ambulatory Visit | Attending: Nephrology | Admitting: Nephrology

## 2019-11-08 VITALS — BP 156/51 | HR 55 | Resp 18

## 2019-11-08 DIAGNOSIS — D631 Anemia in chronic kidney disease: Secondary | ICD-10-CM | POA: Diagnosis not present

## 2019-11-08 DIAGNOSIS — N184 Chronic kidney disease, stage 4 (severe): Secondary | ICD-10-CM | POA: Diagnosis not present

## 2019-11-08 LAB — RENAL FUNCTION PANEL
Albumin: 3.1 g/dL — ABNORMAL LOW (ref 3.5–5.0)
Anion gap: 12 (ref 5–15)
BUN: 65 mg/dL — ABNORMAL HIGH (ref 8–23)
CO2: 16 mmol/L — ABNORMAL LOW (ref 22–32)
Calcium: 9.2 mg/dL (ref 8.9–10.3)
Chloride: 106 mmol/L (ref 98–111)
Creatinine, Ser: 2.67 mg/dL — ABNORMAL HIGH (ref 0.61–1.24)
GFR calc Af Amer: 25 mL/min — ABNORMAL LOW (ref >60)
GFR calc non Af Amer: 22 mL/min — ABNORMAL LOW (ref >60)
Glucose, Bld: 422 mg/dL — ABNORMAL HIGH (ref 70–99)
Phosphorus: 3.2 mg/dL (ref 2.5–4.6)
Potassium: 4.4 mmol/L (ref 3.5–5.1)
Sodium: 134 mmol/L — ABNORMAL LOW (ref 135–145)

## 2019-11-08 LAB — IRON AND TIBC
Iron: 81 ug/dL (ref 45–182)
Saturation Ratios: 38 % (ref 17.9–39.5)
TIBC: 211 ug/dL — ABNORMAL LOW (ref 250–450)
UIBC: 130 ug/dL

## 2019-11-08 LAB — FERRITIN: Ferritin: 319 ng/mL (ref 24–336)

## 2019-11-08 LAB — POCT HEMOGLOBIN-HEMACUE: Hemoglobin: 10.1 g/dL — ABNORMAL LOW (ref 13.0–17.0)

## 2019-11-08 MED ORDER — DARBEPOETIN ALFA 40 MCG/0.4ML IJ SOSY
PREFILLED_SYRINGE | INTRAMUSCULAR | Status: AC
  Filled 2019-11-08: qty 0.4

## 2019-11-08 MED ORDER — DARBEPOETIN ALFA 40 MCG/0.4ML IJ SOSY
40.0000 ug | PREFILLED_SYRINGE | INTRAMUSCULAR | Status: DC
  Administered 2019-11-08: 40 ug via SUBCUTANEOUS

## 2019-11-21 ENCOUNTER — Encounter: Payer: Self-pay | Admitting: Family Medicine

## 2019-11-21 MED ORDER — HYDRALAZINE HCL 100 MG PO TABS: 100.0000 mg | ORAL_TABLET | Freq: Two times a day (BID) | ORAL | 0 refills | Status: DC

## 2019-11-25 ENCOUNTER — Other Ambulatory Visit: Payer: Self-pay | Admitting: *Deleted

## 2019-11-25 NOTE — Patient Outreach (Signed)
[facility]) Care Management  11/25/2019  [place] SIGMON Dec 26, 1940 [id number]   RN Health CoachQuarterly Outreach  Referral Date: 04/15/2019 Referral Source: Transfer from [facility] Reason for Referral: Continued Disease Management Education Insurance:Medicare   Outreach Attempt:  Outreach attempt #1 to patient's wife for follow up.  Wife answered and stated she was driving at the time; requesting call back.  Plan:  [name] will make another outreach attempt within the month of August if no return call back from wife.   [place] [phone] [email or web address]

## 2019-12-02 ENCOUNTER — Ambulatory Visit (INDEPENDENT_AMBULATORY_CARE_PROVIDER_SITE_OTHER): Payer: Medicare Other | Admitting: *Deleted

## 2019-12-02 DIAGNOSIS — I495 Sick sinus syndrome: Secondary | ICD-10-CM

## 2019-12-03 LAB — CUP PACEART REMOTE DEVICE CHECK
Battery Remaining Longevity: 44 mo
Battery Voltage: 2.98 V
Brady Statistic AP VP Percent: 0.07 %
Brady Statistic AP VS Percent: 95.21 %
Brady Statistic AS VP Percent: 0.01 %
Brady Statistic AS VS Percent: 4.72 %
Brady Statistic RA Percent Paced: 95.24 %
Brady Statistic RV Percent Paced: 0.08 %
Date Time Interrogation Session: 20210720093752
Implantable Lead Implant Date: 20091102
Implantable Lead Implant Date: 20091102
Implantable Lead Location: 753859
Implantable Lead Location: 753860
Implantable Lead Model: 5076
Implantable Lead Model: 5076
Implantable Pulse Generator Implant Date: 20170118
Lead Channel Impedance Value: 1235 Ohm
Lead Channel Impedance Value: 380 Ohm
Lead Channel Impedance Value: 380 Ohm
Lead Channel Impedance Value: 494 Ohm
Lead Channel Pacing Threshold Amplitude: 1.5 V
Lead Channel Pacing Threshold Amplitude: 2.125 V
Lead Channel Pacing Threshold Pulse Width: 0.4 ms
Lead Channel Pacing Threshold Pulse Width: 0.4 ms
Lead Channel Sensing Intrinsic Amplitude: 1.5 mV
Lead Channel Sensing Intrinsic Amplitude: 1.5 mV
Lead Channel Sensing Intrinsic Amplitude: 17.75 mV
Lead Channel Sensing Intrinsic Amplitude: 17.75 mV
Lead Channel Setting Pacing Amplitude: 2.5 V
Lead Channel Setting Pacing Amplitude: 4.25 V
Lead Channel Setting Pacing Pulse Width: 0.4 ms
Lead Channel Setting Sensing Sensitivity: 2.8 mV

## 2019-12-04 NOTE — Progress Notes (Signed)
Remote pacemaker transmission.   

## 2019-12-06 ENCOUNTER — Other Ambulatory Visit: Payer: Self-pay

## 2019-12-06 ENCOUNTER — Ambulatory Visit (HOSPITAL_COMMUNITY)
Admission: RE | Admit: 2019-12-06 | Discharge: 2019-12-06 | Disposition: A | Discharge status: Home / Self Care | Payer: Medicare Other | Source: Ambulatory Visit | Attending: Nephrology | Admitting: Nephrology

## 2019-12-06 ENCOUNTER — Telehealth: Payer: Self-pay | Admitting: Family Medicine

## 2019-12-06 VITALS — BP 125/49 | HR 60 | Temp 97.4°F | Resp 20

## 2019-12-06 DIAGNOSIS — N184 Chronic kidney disease, stage 4 (severe): Secondary | ICD-10-CM | POA: Diagnosis not present

## 2019-12-06 LAB — POCT HEMOGLOBIN-HEMACUE: Hemoglobin: 9.5 g/dL — ABNORMAL LOW (ref 13.0–17.0)

## 2019-12-06 LAB — RENAL FUNCTION PANEL
Albumin: 3.1 g/dL — ABNORMAL LOW (ref 3.5–5.0)
Anion gap: 11 (ref 5–15)
BUN: 55 mg/dL — ABNORMAL HIGH (ref 8–23)
CO2: 19 mmol/L — ABNORMAL LOW (ref 22–32)
Calcium: 9.1 mg/dL (ref 8.9–10.3)
Chloride: 109 mmol/L (ref 98–111)
Creatinine, Ser: 2.8 mg/dL — ABNORMAL HIGH (ref 0.61–1.24)
GFR calc Af Amer: 24 mL/min — ABNORMAL LOW (ref >60)
GFR calc non Af Amer: 21 mL/min — ABNORMAL LOW (ref >60)
Glucose, Bld: 184 mg/dL — ABNORMAL HIGH (ref 70–99)
Phosphorus: 3.5 mg/dL (ref 2.5–4.6)
Potassium: 4.2 mmol/L (ref 3.5–5.1)
Sodium: 139 mmol/L (ref 135–145)

## 2019-12-06 MED ORDER — DARBEPOETIN ALFA 40 MCG/0.4ML IJ SOSY
PREFILLED_SYRINGE | INTRAMUSCULAR | Status: AC
  Administered 2019-12-06: 40 ug via SUBCUTANEOUS
  Filled 2019-12-06: qty 0.4

## 2019-12-06 MED ORDER — DARBEPOETIN ALFA 40 MCG/0.4ML IJ SOSY: 40.0000 ug | PREFILLED_SYRINGE | INTRAMUSCULAR | Status: DC

## 2019-12-06 NOTE — Progress Notes (Signed)
  Chronic Care Management   Note  12/06/2019 Name: David [name] MRN: [id number] DOB: November 01, 1940  David [name] is a 79 y.o. year old male who is a primary care patient of [name], MD. I reached out to [name] by phone today in response to a referral sent by [name] PCP, [name], MD.   [name] was given information about Chronic Care Management services today including:  1. CCM service includes personalized support from designated clinical staff supervised by his physician, including individualized plan of care and coordination with other care providers 2. 24/7 contact phone numbers for assistance for urgent and routine care needs. 3. Service will only be billed when office clinical staff spend 20 minutes or more in a month to coordinate care. 4. Only one practitioner may furnish and bill the service in a calendar month. 5. The patient may stop CCM services at any time (effective at the end of the month) by phone call to the office staff.   Patient agreed to services and verbal consent obtained.   Follow up plan:   [place] [facility]

## 2019-12-10 ENCOUNTER — Ambulatory Visit: Payer: Medicare Other

## 2019-12-10 NOTE — Progress Notes (Unsigned)
Chronic Care Management Pharmacy  Name: David [name]  MRN: [id number] DOB: 10-May-1941  Chief Complaint/ HPI  David [name],  79 y.o. , male presents for their {Initial/Follow-up:3041532} CCM visit with the clinical pharmacist {CHL HP Upstream Pharm visit VQQV:[id number]}.  PCP : [name], MD  Chronic conditions include:  No diagnosis found.   Office Visits:  11/04/2019 (Sam): acute pain of left knee, referral to sports medicine doctor.  Consult Visit: 12/31/2019 (Dr. Dwyane Dee, endocrinology): future OV 09/23/2019 (Dr. Dwyane Dee, endocrinology): video,   Patient Active Problem List   Diagnosis Date Noted  . Diarrhea 02/18/2019  . GERD (gastroesophageal reflux disease) 01/24/2019  . Memory change 11/19/2018  . Senile purpura ([place]) 11/27/2017  . CKD (chronic kidney disease) stage 4, GFR 15-29 ml/min ([facility]) 09/26/2017  . 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 Nonobstructive CAD s/p PCI 2012   . Cardiac pacemaker in situ   . PVD (peripheral vascular disease) ([place])   . Anxiety 03/22/2017  . Acute cholecystitis s/p lap cholecystectomy 03/05/2017 03/04/2017  . 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[place]) 03/04/2017  . 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 Fatigue 01/27/2017  . Low vitamin B12 level 01/27/2017  . OSA on CPAP 10/26/2014  . Seasonal and perennial allergic rhinitis 10/25/2013  . Hypertension associated with diabetes ([place]) 10/25/2013  . Hyperlipidemia associated with type 2 diabetes mellitus ([place]) 10/25/2013   Past Surgical History:  Procedure Laterality Date  . 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 AMPUTATION TOE Right 03/12/2015   Procedure: RIGHT HALLUS AMPUTATION ;  Surgeon: [name], MD;  Location: [facility];  Service: Podiatry;  Laterality: Right;  . CARDIAC CATHETERIZATION  11-25-2010   [place], [place]    Nonobstructive CAD  . CARDIAC PACEMAKER PLACEMENT  Nov 2009   [employer]  . CHOLECYSTECTOMY N/A 03/05/2017   Procedure: LAPAROSCOPIC CHOLECYSTECTOMY WITH INTRAOPERATIVE CHOLANGIOGRAM;  Surgeon: [name], MD;  Location: [facility];  Service: General;  Laterality: N/A;  . EP IMPLANTABLE DEVICE N/A 06/03/2015   Procedure: PPM Generator Changeout;  Surgeon: [name], MD;  Location: [facility];  Service: Cardiovascular;  Laterality: N/A;  . EXCISION BONE CYST Right 03/06/2015   Procedure: BONE BIOPSIES OF RIGHT FOOT;  Surgeon: [name], MD;  Location: [facility];  Service: Podiatry;  Laterality: Right;  . ORIF ANKLE FRACTURE Left 11/06/2014   Procedure: OPEN REDUCTION INTERNAL FIXATION (ORIF) LEFT  ANKLE FRACTURE;  Surgeon: [name], MD;  Location: [facility];  Service: Orthopedics;  Laterality: Left;  . TEE WITHOUT CARDIOVERSION N/A 09/01/2017   Procedure: TRANSESOPHAGEAL ECHOCARDIOGRAM (TEE);  Surgeon: [name], MD;  Location: [facility];  Service: Cardiovascular;  Laterality: N/A;  . TOTAL KNEE ARTHROPLASTY    . VEIN LIGATION AND STRIPPING     Social History   Socioeconomic History  . Marital status: Married    Spouse name: Not on file  . Number of children: 3  . Years of education: 63  . Highest education level: Not on file  Occupational History  . Occupation: Retired  Tobacco Use  . Smoking status: Former Smoker    Packs/day: 2.00    Years: 30.00    Pack years: 60.00    Quit date: 03/03/1984    Years since quitting: 35.7  . Smokeless tobacco: Never Used  Vaping Use  . Vaping Use: Never  used  Substance and Sexual Activity  . Alcohol use: Yes    Alcohol/week: 1.0 standard drink    Types: 1 Glasses of wine per week    Comment: social  . Drug use: No  . Sexual activity: Not Currently  Other Topics Concern  . Not on file  Social History Narrative   Currently resides with his wife. 1 dog. Fun/Hobby: Golf    Denies any  religious beliefs effecting health care.    Social Determinants of Health   Financial Resource Strain: Low Risk   . Difficulty of Paying Living Expenses: Not hard at all  Food Insecurity: No Food Insecurity  . Worried About Charity fundraiser in the Last Year: Never true  . Ran Out of Food in the Last Year: Never true  Transportation Needs: No Transportation Needs  . Lack of Transportation (Medical): No  . Lack of Transportation (Non-Medical): No  Physical Activity:   . Days of Exercise per Week:   . Minutes of Exercise per Session:   Stress:   . Feeling of Stress :   Social Connections:   . Frequency of Communication with Friends and Family:   . Frequency of Social Gatherings with Friends and Family:   . Attends Religious Services:   . Active Member of Clubs or Organizations:   . Attends Archivist Meetings:   Marland Kitchen Marital Status:    Family History  Problem Relation Age of Onset  . Cancer Mother        breast  . Heart attack Father   . Stroke Father    Allergies  Allergen Reactions  . Ativan [Lorazepam] Anxiety    Pt gets more agitated  . Adhesive [Tape] Other (See Comments)    blisters   Outpatient Encounter Medications as of 12/10/2019  Medication Sig  . acetaminophen (TYLENOL) 325 MG tablet Take 1-2 tablets (325-650 mg total) by mouth every 4 (four) hours as needed for mild pain.  Marland Kitchen amLODipine (NORVASC) 5 MG tablet Take 1 tablet (5 mg total) by mouth daily.  Marland Kitchen atorvastatin (LIPITOR) 10 MG tablet TAKE 1 TABLET BY MOUTH ONCE DAILY AT  6PM  . blood glucose meter kit and supplies KIT Dispense based on patient and insurance preference. Use up to four times daily as directed. (FOR ICD-9 250.00, 250.01).  . calcitRIOL (ROCALTROL) 0.25 MCG capsule Take 1 capsule by mouth once daily  . carvedilol (COREG) 25 MG tablet TAKE 1 TABLET BY MOUTH TWICE DAILY WITH A MEAL  . cholecalciferol (VITAMIN D) 1000 units tablet Take 1 tablet (1,000 Units total) by mouth daily.  .  cholestyramine light (PREVALITE) 4 g packet Take 1 packet (4 g total) by mouth 2 (two) times daily.  . clopidogrel (PLAVIX) 75 MG tablet Take 1 tablet (75 mg total) by mouth daily.  . diclofenac sodium (VOLTAREN) 1 % GEL Apply 2 g topically 4 (four) times daily.  . folic acid (FOLVITE) 1 MG tablet Take 1 mg by mouth daily.  Marland Kitchen glucose blood test strip Use Contour test strips as instructed to check blood sugar four times daily.  . hydrALAZINE (APRESOLINE) 100 MG tablet Take 1 tablet (100 mg total) by mouth 2 (two) times daily.  . insulin degludec (TRESIBA FLEXTOUCH) 200 UNIT/ML FlexTouch Pen INJECT 44 UNITS SUBCUTANEOUSLY ONCE DAILY  . Insulin Syringe-Needle U-100 (INSULIN SYRINGE 1CC/31GX5/16") 31G X 5/16" 1 ML MISC 1 each by Does not apply route 3 (three) times daily. Use insulin syringe to inject insulin three times  daily.  . isosorbide mononitrate (IMDUR) 30 MG 24 hr tablet Take 2 tablets by mouth once daily  . mupirocin ointment (BACTROBAN) 2 % Apply to left 3rd digit once daily.  Marland Kitchen NOVOLOG FLEXPEN 100 UNIT/ML FlexPen Inject 10 units under the skin before the first meal of the day and 12 units at dinner.  . Omega-3 Fatty Acids (FISH OIL) 1000 MG CAPS Take 1 capsule (1,000 mg total) by mouth every morning.  . pantoprazole (PROTONIX) 40 MG tablet Take 1 tablet (40 mg total) by mouth daily.  . sertraline (ZOLOFT) 100 MG tablet Take 1 tablet (100 mg total) by mouth daily.  Marland Kitchen torsemide (DEMADEX) 20 MG tablet Take 2 tablets (5m) by mouth every OTHER day.  .Marland KitchenVICTOZA 18 MG/3ML SOPN INJECT 0.3ML (1.8MG TOTAL) INTO THE SKIN DAILY BEFORE SUPPER  . vitamin B-12 (CYANOCOBALAMIN) 100 MCG tablet Take 100 mcg by mouth daily.   No facility-administered encounter medications on file as of 12/10/2019.   Patient Care Team    Relationship Specialty Notifications Start End  PVivi Barrack MD PCP - General Family Medicine  02/10/17   CDonato Heinz MD Consulting Physician Nephrology  07/18/14   KElayne Snare  MD Consulting Physician Endocrinology  03/05/17   KDeboraha Sprang MD Consulting Physician Cardiology  03/05/17   TLeona Singleton RN TAltusManagement  Admissions 04/15/19   GMarzetta Board DPM Consulting Physician Podiatry  07/11/19   OSy[place] Heather, OMattoonPhysician Optometry  07/11/19   MFrann Rider NP Nurse Practitioner Neurology  07/11/19   PMadelin Rear RThe [name] Medical CenterPharmacist Pharmacist  12/06/19    Comment: [phone]  Current Diagnosis/Assessment: Goals Addressed   None    Hypertension   BP goal {CHL HP UPSTREAM Pharmacist BP [phone]}  BP Readings from Last 3 Encounters:  12/06/19 (!) 125/49  11/08/19 (!) 156/51  11/07/19 126/60   Patient has failed these meds in the past: *** Patient checks BP at home {CHL HP BP Monitoring [phone]} Patient home BP readings are ranging: ***  Patient is currently {CHL Controlled/Uncontrolled:[phone]} on the following medications:  . Amlodipine 5 mg once daily . Carvedilol 25 mg twice daily with meal  . Hydralazine 100 mg twice daily   We discussed {CHL HP Upstream Pharmacy discussion:[id number]}.  Plan  Continue {CHL HP Upstream Pharmacy Plans:[phone]}.  ***   Patient has failed these meds in past: *** Patient is currently {CHL Controlled/Uncontrolled:[phone]} on the following medications:  . Clopidogrel 75 mg once dialy   We discussed:  ***  Plan  Continue {CHL HP Upstream Pharmacy Plans:[phone]}  Diabetes   A1c goal < 7%  Lab Results  Component Value Date/Time   HGBA1C 6.8 (H) 09/19/2019 01:53 PM   HGBA1C 8.3 (H) 03/01/2019 02:23 PM   HGBA1C 11.5 (H) 10/26/2014 04:36 AM   MICROALBUR 7.3 (H) 03/05/2019 03:26 PM   MICROALBUR 11.9 (H) 10/24/2017 10:50 AM    Checking BG: {CHL HP Blood Glucose Monitoring [phone]} Recent FBG Readings: Recent pre-meal BG readings: *** Recent 2hr PP BG readings:  *** Recent HS BG readings: *** Patient has  failed these meds in past: *** Patient is currently controlled on the following medications:  . tresiba flextouch 44 units once daily . victoza 1.8 mg once daily before supper  We discussed: {CHL HP Upstream Pharmacy discussion:[id number]}.  Plan  Continue {CHL HP Upstream Pharmacy Plans:[phone]}.  AFIB   Pulse Readings from Last 3 Encounters:  12/06/19 60  11/08/19 (!) 55  11/07/19 64  HR *** BPM. Patient is currently {CHL HP BP RATE/RHYTHM:[id number]} controlled on the following medications:  . ***  Prevention of stroke in Afib. CHA2DS2/VAS Stroke Risk Points  Current as of 3 days ago     8 >= 2 Points: High Risk  1 - 1.99 Points: Medium Risk  0 Points: Low Risk    No Change      Details    This score determines the patient's risk of having a stroke if the  patient has atrial fibrillation.       Points Metrics  1 Has Congestive Heart Failure:  Yes    Current as of 3 days ago  1 Has Vascular Disease:  Yes    Current as of 3 days ago  1 Has Hypertension:  Yes    Current as of 3 days ago  2 Age:  36    Current as of 3 days ago  1 Has Diabetes:  Yes    Current as of 3 days ago  2 Had Stroke:  Yes  Had TIA:  No  Had thromboembolism:  No    Current as of 3 days ago  0 Male:  No    Current as of 3 days ago         . ***Denies any abnormal bruising, bleeding from nose or gums or blood in urine or stool. Patient is currently {CHL Controlled/Uncontrolled:[phone]} on the following medications:  . ***  We discussed:  {CHL HP Upstream Pharmacy [phone]}.  Plan  Continue {CHL HP Upstream Pharmacy Plans:[phone]}.  COPD Asthma***    Last spirometry score: *** Gold Grade: {CHL HP Upstream Pharm COPD Gold MVHQI:[id number]} Current COPD Classification:  {CHL HP Upstream Pharm COPD Classification:2263899522} Eosinophil count:   Lab Results  Component Value Date/Time   EOSPCT 2 01/05/2019 02:34 PM                                 Eos  (Absolute):  Lab Results  Component Value Date/Time   EOSABS 0.2 01/05/2019 02:34 PM    Patient has failed these meds in past: *** Patient is currently {CHL Controlled/Uncontrolled:[phone]} on the following medications: *** Using maintenance inhaler regularly? {yes/no:20286} Frequency of rescue inhaler use:  {CHL HP Upstream Pharm Inhaler LKGM:0102725366}  Tobacco Status:  Social History   Tobacco Use  Smoking Status Former Smoker  . Packs/day: 2.00  . Years: 30.00  . Pack years: 60.00  . Quit date: 03/03/1984  . Years since quitting: 35.7  Smokeless Tobacco Never Used    Patient smokes {Time to first cigarette:23873} Patient triggers include: {Smoking Triggers:23882} On a scale of 1-10, reports MOTIVATION to quit is *** On a scale of 1-10, reports CONFIDENCE in quitting is *** Previous quit attempts included: *** Patient is currently {CHL Controlled/Uncontrolled:[phone]} on the following medications:  . ***  We discussed:  {Smoking Cessation Counseling:23883}  Plan  Continue {CHL HP Upstream Pharmacy Plans:[phone]}.  Tobacco Abuse   Tobacco Status:  Social History   Tobacco Use  Smoking Status Former Smoker  . Packs/day: 2.00  . Years: 30.00  . Pack years: 60.00  . Quit date: 03/03/1984  . Years since quitting: 35.7  Smokeless Tobacco Never Used    Patient smokes {Time to first cigarette:23873} Patient triggers include: {Smoking Triggers:23882} On a scale of 1-10, reports MOTIVATION to quit is *** On a scale of 1-10, reports CONFIDENCE in quitting is ***  Previous quit attempts  included: *** Patient is currently {CHL Controlled/Uncontrolled:6154144024} on the following medications:  . ***  We discussed:  {Smoking Cessation Counseling:23883}.  Plan  Continue {CHL HP Upstream Pharmacy Plans:[phone]}.  Heart Failure   Type: {type of heart failure:30421350} Last ejection fraction: *** NYHA Class: {CHL HP Upstream Pharm NYHA  [phone]} AHA HF Stage: {CHL HP Upstream Pharm AHA HF [phone]}  Patient has failed these meds in past: *** Patient is currently {CHL Controlled/Uncontrolled:6154144024} on the following medications:  . ***  We discussed {CHL HP Upstream Pharmacy [phone]}.  Plan  Continue {CHL HP Upstream Pharmacy Plans:[phone]}.  Hyperlipidemia   LDL goal < ***  Lipid Panel     Component Value Date/Time   CHOL 108 03/01/2019 1423   TRIG 255.0 (H) 03/01/2019 1423   HDL 22.10 (L) 03/01/2019 1423   LDLCALC 73 01/06/2019 0558   LDLDIRECT 37.0 03/01/2019 1423    Hepatic Function Latest Ref Rng & Units 12/06/2019 11/08/2019 10/11/2019  Total Protein 6.0 - 8.3 g/dL - - -  Albumin 3.5 - 5.0 g/dL 3.1(L) 3.1(L) 3.3(L)  AST 0 - 37 U/L - - -  ALT 0 - 53 U/L - - -  Alk Phosphatase 39 - 117 U/L - - -  Total Bilirubin 0.2 - 1.2 mg/dL - - -  Bilirubin, Direct 0.0 - 0.3 mg/dL - - -    The ASCVD Risk score Mikey Bussing DC Jr., et al., 2013) failed to calculate for the following reasons:   The patient has a prior MI or stroke diagnosis   Patient has failed these meds in past: *** Patient is currently {CHL Controlled/Uncontrolled:6154144024} on the following medications:  . ***  We discussed:  {CHL HP Upstream Pharmacy [phone]}.  Plan  Continue {CHL HP Upstream Pharmacy Plans:[phone]}.  Hypothyroidism   Lab Results  Component Value Date/Time   TSH 2.410 03/06/2019 02:53 PM   TSH 1.165 01/11/2019 04:20 PM   TSH 2.38 07/17/2018 11:10 AM    Patient has failed these meds in past: *** Patient is currently {CHL Controlled/Uncontrolled:6154144024} on the following medications:  . ***  We discussed:  {CHL HP Upstream Pharmacy [phone]}.  Plan  Continue {CHL HP Upstream Pharmacy Plans:[phone]}.  GERD   Patient {Actions; denies-reports:120008::"denies"} {gerd assoc sx:31969:o:"dysphagia","heartburn","nausea"}. Expresses understanding to  avoid triggers such as {causes; exacerbators GERD:13199}.  Currently {CHL Controlled/Uncontrolled:6154144024} on: . ***  Plan   {rxplan:23810::"Continue current medication."}  Osteopenia / Osteoporosis   Last DEXA Scan: ***   T-Score femoral neck: ***  T-Score total hip: ***  T-Score lumbar spine: ***  T-Score forearm radius: ***  10-year probability of major osteoporotic fracture: ***  10-year probability of hip fracture: ***  Vit D, 25-Hydroxy  Date Value Ref Range Status  05/24/2018 63.9  Final     Patient {is;is not an osteoporosis candidate:23886}  Patient has failed these meds in past: *** Patient is currently {CHL Controlled/Uncontrolled:6154144024} on the following medications:  . ***  We discussed:  {Osteoporosis Counseling:23892}.  Plan  Continue {CHL HP Upstream Pharmacy Plans:[phone]}.  BPH    {denies_reports_expresses:23828} urinary symptoms {bph sx:315752} for *** {gen duration:315003}. {denies_reports_expresses:23828} side effects including dizziness.  No results found for: PSA  Patient is currently {CHL Controlled/Uncontrolled:6154144024} on  . ***  Plan  Continue {CHL HP Upstream Pharmacy Plans:[phone]}.  ***   Patient has failed these meds in past: *** Patient is currently {CHL Controlled/Uncontrolled:6154144024} on the following medications:  . ***  We discussed:  ***  Plan  Continue {CHL HP Upstream Pharmacy WEXHB:7169678938}  Vaccines  Immunization History  Administered Date(s) Administered  . Fluad Quad(high Dose 65+) 03/05/2019  . Influenza, High Dose Seasonal PF 02/09/2017  . Influenza,inj,Quad PF,6+ Mos 01/30/2014  . Influenza-Unspecified 03/17/2015, 04/15/2016, 01/14/2018  . PFIZER SARS-COV-2 Vaccination 06/21/2019, 07/16/2019    Reviewed and discussed patient's vaccination history.    Plan  Recommended patient receive *** vaccine in *** office.   Medication Management   Receives prescription medications  from:  [id number] [place] [id number]: [phone]: [phone]   ***  Plan  {US Pharmacy PPUGG:16619} ___________________________ Visit Information  RPH Follow up: *** month phone visit. RPH Follow up: *** month phone visit.  SDOH (Social Determinants of Health) assessments performed: Yes.  Mr. AOakleywas given information about Chronic Care Management services today including:  1. CCM service includes personalized support from designated clinical staff supervised by his physician, including individualized plan of care and coordination with other care providers 2. 24/7 contact phone numbers for assistance for urgent and routine care needs. 3. Standard insurance, coinsurance, copays and deductibles apply for chronic care management only during months in which we provide at least 20 minutes of these services. Most insurances cover these services at 100%, however patients may be responsible for any copay, coinsurance and/or deductible if applicable. This service may help you avoid the need for more expensive face-to-face services. 4. Only one practitioner may furnish and bill the service in a calendar month. 5. The patient may stop CCM services at any time (effective at the end of the month) by phone call to the office staff.  Patient agreed to services and verbal consent obtained.   JMadelin Rear Pharm.D., BCGP Clinical Pharmacist LMagnetic [phone]

## 2019-12-19 ENCOUNTER — Encounter: Payer: Self-pay | Admitting: *Deleted

## 2019-12-19 ENCOUNTER — Other Ambulatory Visit: Payer: Self-pay | Admitting: *Deleted

## 2019-12-19 NOTE — Patient Outreach (Signed)
[facility]) Care Management  [facility] Care Manager  12/19/2019   David [name] [birth year] [id number]   [name] Quarterly Outreach   Referral Date:  04/15/2019 Referral Source:  Transfer from [place] Reason for Referral:  Continued Disease Management Education Insurance:  Medicare   Outreach Attempt:  Successful telephone outreach to patient's wife, David [name] for follow up.  HIPAA verified with wife.  Wife reporting patient continues to be tired and sleeping a lot throughout the day.  Discussed increasing patient's activity during the daytime.  Offered home and chair exercises and wife stated they have the education and handouts already.  Fasting blood sugar this morning was 137 with recent fasting ranges of 130's.  Denies any recent falls.  Wife reports patient receiving monthly eye injections for diabetic retinopathy.  Encounter Medications:  Outpatient Encounter Medications as of 12/19/2019  Medication Sig Note  . acetaminophen (TYLENOL) 325 MG tablet Take 1-2 tablets (325-650 mg total) by mouth every 4 (four) hours as needed for mild pain.   Marland Kitchen amLODipine (NORVASC) 5 MG tablet Take 1 tablet (5 mg total) by mouth daily.   Marland Kitchen atorvastatin (LIPITOR) 10 MG tablet TAKE 1 TABLET BY MOUTH ONCE DAILY AT  6PM   . calcitRIOL (ROCALTROL) 0.25 MCG capsule Take 1 capsule by mouth once daily   . carvedilol (COREG) 25 MG tablet TAKE 1 TABLET BY MOUTH TWICE DAILY WITH A MEAL   . cholecalciferol (VITAMIN D) 1000 units tablet Take 1 tablet (1,000 Units total) by mouth daily.   . cholestyramine light (PREVALITE) 4 g packet Take 1 packet (4 g total) by mouth 2 (two) times daily.   . clopidogrel (PLAVIX) 75 MG tablet Take 1 tablet (75 mg total) by mouth daily.   . folic acid (FOLVITE) 1 MG tablet Take 1 mg by mouth daily.   . hydrALAZINE (APRESOLINE) 100 MG tablet Take 1 tablet (100 mg total) by mouth 2 (two) times daily.   . insulin degludec (TRESIBA FLEXTOUCH) 200  UNIT/ML FlexTouch Pen INJECT 44 UNITS SUBCUTANEOUSLY ONCE DAILY   . isosorbide mononitrate (IMDUR) 30 MG 24 hr tablet Take 2 tablets by mouth once daily   . NOVOLOG FLEXPEN 100 UNIT/ML FlexPen Inject 10 units under the skin before the first meal of the day and 12 units at dinner.   . Omega-3 Fatty Acids (FISH OIL) 1000 MG CAPS Take 1 capsule (1,000 mg total) by mouth every morning.   . pantoprazole (PROTONIX) 40 MG tablet Take 1 tablet (40 mg total) by mouth daily.   Marland Kitchen torsemide (DEMADEX) 20 MG tablet Take 2 tablets (28m) by mouth every OTHER day.   .Marland KitchenVICTOZA 18 MG/3ML SOPN INJECT 0.3ML (1.8MG TOTAL) INTO THE SKIN DAILY BEFORE SUPPER   . vitamin B-12 (CYANOCOBALAMIN) 100 MCG tablet Take 100 mcg by mouth daily.   . blood glucose meter kit and supplies KIT Dispense based on patient and insurance preference. Use up to four times daily as directed. (FOR ICD-9 250.00, 250.01).   .Marland Kitchendiclofenac sodium (VOLTAREN) 1 % GEL Apply 2 g topically 4 (four) times daily.   .Marland Kitchenglucose blood test strip Use Contour test strips as instructed to check blood sugar four times daily.   . Insulin Syringe-Needle U-100 (INSULIN SYRINGE 1CC/31GX5/16") 31G X 5/16" 1 ML MISC 1 each by Does not apply route 3 (three) times daily. Use insulin syringe to inject insulin three times daily.   . mupirocin ointment (BACTROBAN) 2 % Apply to left 3rd  digit once daily.   . sertraline (ZOLOFT) 100 MG tablet Take 1 tablet (100 mg total) by mouth daily. 12/19/2019: Continues to take   No facility-administered encounter medications on file as of 12/19/2019.    Functional Status:  In your present state of health, do you have any difficulty performing the following activities: 07/11/2019 05/13/2019  Hearing? N Y  Comment - bilateral hearing aids  Vision? N Y  Comment - retinopathy treatments  Difficulty concentrating or making decisions? Y Y  Comment forgetfullness at times trouble remembering, mild dementia  Walking or climbing stairs? Y Y    Comment - frequent falls, trouble climbing stairs  Dressing or bathing? N Y  Comment - wife assist with bathing and dressing  Doing errands, shopping? N Y  Comment - wife and family Land and eating ? N Y  Comment - wife and family cooks  Using the Toilet? N Y  Comment - wife assist to bathroom  In the past six months, have you accidently leaked urine? N Y  Comment - urinary incontinence  Do you have problems with loss of bowel control? N Y  Comment - trouble controlling bowels  Managing your Medications? N Y  Comment - wife manages medications  Managing your Finances? N Y  Comment - wife and family pays bills  Housekeeping or managing your Housekeeping? N Y  Comment - wife and family cleans  Some recent data might be hidden    Fall/Depression Screening: Fall Risk  12/19/2019 09/09/2019 07/11/2019  Falls in the past year? '1 1 1  ' Comment - - -  Number falls in past yr: '1 1 1  ' Comment last fall December 2020 Last fall December 2020 -  Injury with Fall? 0 0 0  Comment - - minor  Risk for fall due to : History of fall(s);Impaired balance/gait;Impaired mobility;Impaired vision;Medication side effect History of fall(s);Impaired balance/gait;Impaired mobility;Impaired vision;Medication side effect History of fall(s);Impaired balance/gait;Impaired mobility  Risk for fall due to: Comment - - -  Follow up Falls evaluation completed;Education provided;Falls prevention discussed Falls evaluation completed;Education provided;Falls prevention discussed Education provided;Falls prevention discussed;Falls evaluation completed   PHQ 2/9 Scores 07/11/2019 05/13/2019 04/03/2019 01/18/2019 01/14/2019 11/19/2018 09/25/2017  PHQ - 2 Score 0 - 0 1 1 0 0  PHQ- 9 Score - - - - - - 1  Exception Documentation - Other- indicate reason in comment box - - Other- indicate reason in comment box - -  Not completed - did not speak with patient, spoke with wife - - Spouse states patrient is being worked  up for dementia and has memory issues - -   Goals Addressed              This Visit's Progress   .  Patient Stated (pt-stated)        CARE PLAN ENTRY (see longtitudinal plan of care for additional care plan information)  Objective:  Lab Results  Component Value Date   HGBA1C 6.8 (H) 09/19/2019 .   Lab Results  Component Value Date   CREATININE 2.80 (H) 12/06/2019   CREATININE 2.67 (H) 11/08/2019   CREATININE 2.62 (H) 10/11/2019 .   Marland Kitchen No results found for: EGFR  Current Barriers:  Marland Kitchen Knowledge Deficits related to basic Diabetes pathophysiology and self care/management  Case Manager Clinical Goal(s):  Over the next 90 days, patient will demonstrate improved adherence to prescribed treatment plan for diabetes self care/management as evidenced by: maintaining Hgb A1C of 7 or below .  Verbalize daily monitoring and recording of CBG within 90 days . Verbalize adherence to ADA/ carb modified diet within the next 90 days . Verbalize adherence to prescribed medication regimen within the next 90 days   Interventions:  . Provided education to patient about basic DM disease process . Reviewed medications with patient and discussed importance of medication adherence . Discussed plans with patient for ongoing care management follow up and provided patient with direct contact information for care management team . Provided patient with written educational materials related to hypo and hyperglycemia and importance of correct treatment . Reviewed scheduled/upcoming provider appointments including: upcoming Endocrinology appointment in August  Patient Self Care Activities:  . Self administers insulin as prescribed . Self administers injectable DM medication (Victoza) as prescribed . Attends all scheduled provider appointments . Checks blood sugars as prescribed and utilize hyper and hypoglycemia protocol as needed . Adheres to prescribed ADA/carb modified  Initial goal documentation        Appointments:  Has scheduled Endocrinologist appointment with Dr. Dwyane Dee on 12/31/2019.  Plan: [name] will send primary care provider quarterly update. [name] will make next telephone outreach to wife within the month of November and wife agrees to future outreach.  [place] [phone] [email or web address]

## 2019-12-24 ENCOUNTER — Ambulatory Visit: Payer: Medicare Other | Admitting: Adult Health

## 2019-12-24 DIAGNOSIS — E113313 Type 2 diabetes mellitus with moderate nonproliferative diabetic retinopathy with macular edema, bilateral: Secondary | ICD-10-CM | POA: Diagnosis not present

## 2019-12-24 DIAGNOSIS — E113312 Type 2 diabetes mellitus with moderate nonproliferative diabetic retinopathy with macular edema, left eye: Secondary | ICD-10-CM | POA: Diagnosis not present

## 2019-12-25 ENCOUNTER — Other Ambulatory Visit (INDEPENDENT_AMBULATORY_CARE_PROVIDER_SITE_OTHER): Payer: Medicare Other

## 2019-12-25 ENCOUNTER — Other Ambulatory Visit: Payer: Medicare Other

## 2019-12-25 ENCOUNTER — Other Ambulatory Visit: Payer: Self-pay

## 2019-12-25 DIAGNOSIS — E782 Mixed hyperlipidemia: Secondary | ICD-10-CM

## 2019-12-25 DIAGNOSIS — Z794 Long term (current) use of insulin: Secondary | ICD-10-CM

## 2019-12-25 DIAGNOSIS — E1165 Type 2 diabetes mellitus with hyperglycemia: Secondary | ICD-10-CM | POA: Diagnosis not present

## 2019-12-25 LAB — COMPREHENSIVE METABOLIC PANEL
ALT: 14 U/L (ref 0–53)
AST: 13 U/L (ref 0–37)
Albumin: 3.8 g/dL (ref 3.5–5.2)
Alkaline Phosphatase: 83 U/L (ref 39–117)
BUN: 52 mg/dL — ABNORMAL HIGH (ref 6–23)
CO2: 22 mEq/L (ref 19–32)
Calcium: 9.4 mg/dL (ref 8.4–10.5)
Chloride: 106 mEq/L (ref 96–112)
Creatinine, Ser: 2.5 mg/dL — ABNORMAL HIGH (ref 0.40–1.50)
GFR: 25.02 mL/min — ABNORMAL LOW (ref >60.00)
Glucose, Bld: 183 mg/dL — ABNORMAL HIGH (ref 70–99)
Potassium: 4.4 mEq/L (ref 3.5–5.1)
Sodium: 139 mEq/L (ref 135–145)
Total Bilirubin: 0.4 mg/dL (ref 0.2–1.2)
Total Protein: 6.8 g/dL (ref 6.0–8.3)

## 2019-12-25 LAB — LIPID PANEL
Cholesterol: 118 mg/dL (ref 0–200)
HDL: 23.6 mg/dL — ABNORMAL LOW (ref >39.00)
NonHDL: 93.91
Total CHOL/HDL Ratio: 5
Triglycerides: 217 mg/dL — ABNORMAL HIGH (ref 0.0–149.0)
VLDL: 43.4 mg/dL — ABNORMAL HIGH (ref 0.0–40.0)

## 2019-12-25 LAB — LDL CHOLESTEROL, DIRECT: Direct LDL: 45 mg/dL

## 2019-12-25 LAB — HEMOGLOBIN A1C: Hgb A1c MFr Bld: 6.9 % — ABNORMAL HIGH (ref 4.6–6.5)

## 2019-12-27 ENCOUNTER — Ambulatory Visit: Payer: Medicare Other | Admitting: Endocrinology

## 2019-12-31 ENCOUNTER — Encounter: Payer: Self-pay | Admitting: Endocrinology

## 2019-12-31 ENCOUNTER — Other Ambulatory Visit: Payer: Self-pay

## 2019-12-31 ENCOUNTER — Ambulatory Visit (INDEPENDENT_AMBULATORY_CARE_PROVIDER_SITE_OTHER): Payer: Medicare Other | Admitting: Endocrinology

## 2019-12-31 VITALS — BP 130/62 | HR 65 | Ht 76.0 in | Wt 273.4 lb

## 2019-12-31 DIAGNOSIS — N1832 Chronic kidney disease, stage 3b: Secondary | ICD-10-CM

## 2019-12-31 DIAGNOSIS — I2510 Atherosclerotic heart disease of native coronary artery without angina pectoris: Secondary | ICD-10-CM | POA: Diagnosis not present

## 2019-12-31 DIAGNOSIS — E1165 Type 2 diabetes mellitus with hyperglycemia: Secondary | ICD-10-CM | POA: Diagnosis not present

## 2019-12-31 DIAGNOSIS — Z794 Long term (current) use of insulin: Secondary | ICD-10-CM

## 2019-12-31 DIAGNOSIS — E782 Mixed hyperlipidemia: Secondary | ICD-10-CM

## 2019-12-31 LAB — POCT GLUCOSE (DEVICE FOR HOME USE): POC Glucose: 163 mg/dl — AB (ref 70–99)

## 2019-12-31 NOTE — Patient Instructions (Signed)
Take 5 Novolog for carby snacks   Check blood sugars on waking up 5 days a week  Also check blood sugars about 2 hours after meals and do this after different meals by rotation  Recommended blood sugar levels on waking up are 90-130 and about 2 hours after meal is 130-160  Please bring your blood sugar monitor to each visit, thank you

## 2019-12-31 NOTE — Addendum Note (Signed)
Addended by: Gae Dry D on: 12/31/2019 03:27 PM   Modules accepted: Orders

## 2019-12-31 NOTE — Progress Notes (Signed)
Patient ID: David [name], male   DOB: April 08, 1941, 80 y.o.   MRN: [id number]    Reason for Appointment: Followup for Type 2 Diabetes   History of Present Illness:          Diagnosis: Type 2 diabetes mellitus, date of diagnosis:   1992       Past history:  He was initially treated with metformin and at some point also glipizide. Apparently he was started on insulin in 1994 approximately because of poor control He has been on various insulin regimens over the last several years However even with insulin he has had poor control for at least the last 7 or 8 years. He does not know what his previous A1c levels have been. He had been continued on metformin and glipizide but metformin stopped because of kidney function abnormality He had been taking Lantus 60 units twice a day with NovoLog previously and also Before his initial consultation in 6/15 he was on NovoLog twice a day and Humalog mix insulin  Because of poor control and large insulin doses he was started on Victoza in 6/15  Since 04/28/14 he had been on a Medtronic insulin pump because of persistent poor control and high insulin requirement  Recent history:   Non-insulin hypoglycemic drugs the patient is taking are: VICTOZA 1.8 mg daily  INSULIN regimen: Tresiba 44 units daily, NovoLog 10 units at brunch and 12 units at dinner   A1c is 6.9 compared to 6.8  Fructosamine : 329 last  Current management, problems and blood sugar patterns:  He has variable blood sugars which are being checked before breakfast and dinner  On adjusting mealtime dose based on what he is eating  Also if he has snacks during the day he will not cover them with 5 units of NovoLog as directed previously and today blood sugar is 163 in the early afternoon  He is eating a late breakfast around noon and dinner around 7 PM, usually no lunch but sometimes will have snacks like crackers or half an average.  Also may sometimes have snacks late at  night  He has a bronze around 12p, 7 pm snack    PRE-MEAL Fasting Lunch Dinner Bedtime Overall  Glucose range:  72-206   106-185    Mean/median:  150   158   150   Previous readings:  PRE-MEAL Fasting Lunch Dinner Bedtime Overall  Glucose range: 63-178  136-187    Mean/median:           Self-care:   Meals: 2- 3 meals per day.   breakfast is cheerios or egg and toast.  Will have half sandwich with soup at 1 pm , dinner at  6-7 pm.     Bedtime snack is usually crackers  with milk or have been admitted sandwich          Last  consultation : Most recent: 08/2017      Wt Readings from Last 3 Encounters:  12/31/19 273 lb 6.4 oz (124 kg)  11/07/19 277 lb 6.4 oz (125.8 kg)  11/04/19 277 lb 6.4 oz (125.8 kg)   Glycemic control:   Lab Results  Component Value Date   HGBA1C 6.9 (H) 12/25/2019   HGBA1C 6.8 (H) 09/19/2019   HGBA1C 8.3 (H) 03/01/2019   Lab Results  Component Value Date   MICROALBUR 7.3 (H) 03/05/2019   LDLCALC 73 01/06/2019   CREATININE 2.50 (H) 12/25/2019  Lab Results  Component Value Date   FRUCTOSAMINE 329 (H) 05/02/2019   FRUCTOSAMINE 295 (H) 12/28/2018   FRUCTOSAMINE 316 (H) 07/17/2018   FRUCTOSAMINE 319 (H) 12/27/2017     Other active problems: See review of systems   Allergies as of 12/31/2019      Reactions   Ativan [lorazepam] Anxiety   Pt gets more agitated   Adhesive [tape] Other (See Comments)   blisters      Medication List       Accurate as of December 31, 2019  1:31 PM. If you have any questions, ask your nurse or doctor.        acetaminophen 325 MG tablet Commonly known as: TYLENOL Take 1-2 tablets (325-650 mg total) by mouth every 4 (four) hours as needed for mild pain.   amLODipine 5 MG tablet Commonly known as: NORVASC Take 1 tablet (5 mg total) by mouth daily.   atorvastatin 10 MG tablet Commonly known as: LIPITOR TAKE 1 TABLET BY MOUTH ONCE DAILY AT  6PM   blood glucose meter kit and supplies  Kit Dispense based on patient and insurance preference. Use up to four times daily as directed. (FOR ICD-9 250.00, 250.01).   calcitRIOL 0.25 MCG capsule Commonly known as: ROCALTROL Take 1 capsule by mouth once daily   carvedilol 25 MG tablet Commonly known as: COREG TAKE 1 TABLET BY MOUTH TWICE DAILY WITH A MEAL   cholecalciferol 1000 units tablet Commonly known as: VITAMIN D Take 1 tablet (1,000 Units total) by mouth daily.   cholestyramine light 4 g packet Commonly known as: Prevalite Take 1 packet (4 g total) by mouth 2 (two) times daily.   clopidogrel 75 MG tablet Commonly known as: PLAVIX Take 1 tablet (75 mg total) by mouth daily.   diclofenac sodium 1 % Gel Commonly known as: VOLTAREN Apply 2 g topically 4 (four) times daily.   Fish Oil 1000 MG Caps Take 1 capsule (1,000 mg total) by mouth every morning.   folic acid 1 MG tablet Commonly known as: FOLVITE Take 1 mg by mouth daily.   glucose blood test strip Use Contour test strips as instructed to check blood sugar four times daily.   hydrALAZINE 100 MG tablet Commonly known as: APRESOLINE Take 1 tablet (100 mg total) by mouth 2 (two) times daily.   INSULIN SYRINGE 1CC/31GX5/16" 31G X 5/16" 1 ML Misc 1 each by Does not apply route 3 (three) times daily. Use insulin syringe to inject insulin three times daily.   isosorbide mononitrate 30 MG 24 hr tablet Commonly known as: IMDUR Take 2 tablets by mouth once daily   mupirocin ointment 2 % Commonly known as: BACTROBAN Apply to left 3rd digit once daily.   NovoLOG FlexPen 100 UNIT/ML FlexPen Generic drug: insulin aspart Inject 10 units under the skin before the first meal of the day and 12 units at dinner.   pantoprazole 40 MG tablet Commonly known as: PROTONIX Take 1 tablet (40 mg total) by mouth daily.   sertraline 100 MG tablet Commonly known as: ZOLOFT Take 1 tablet (100 mg total) by mouth daily.   torsemide 20 MG tablet Commonly known as:  DEMADEX Take 2 tablets (54m) by mouth every OTHER day.   TTyler AasFlexTouch 200 UNIT/ML FlexTouch Pen Generic drug: insulin degludec INJECT 44 UNITS SUBCUTANEOUSLY ONCE DAILY   Victoza 18 MG/3ML Sopn Generic drug: liraglutide INJECT 0.3ML (1.8MG TOTAL) INTO THE SKIN DAILY BEFORE SUPPER   vitamin B-12 100 MCG tablet Commonly known  as: CYANOCOBALAMIN Take 100 mcg by mouth daily.       Allergies:  Allergies  Allergen Reactions  . Ativan [Lorazepam] Anxiety    Pt gets more agitated  . Adhesive [Tape] Other (See Comments)    blisters    Past Medical History:  Diagnosis Date  . Acute cholecystitis s/p lap cholecystectomy 03/05/2017 03/04/2017  . Anemia, iron deficiency   . Anxiety   . Arthritis   . Atrial fibrillation with rapid ventricular response ([place])   . BPH (benign prostatic hypertrophy)   . CAD (coronary artery disease)    Nonobstructive CAD per cath  . Cardiac pacemaker in situ   . CHF (congestive heart failure) ([place])   . Chronic ulcer of right foot ([place])   . CKD (chronic kidney disease) stage 4, GFR 15-29 ml/min (HCC) 09/26/2017  . 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 Coronary artery disease involving native coronary artery of native heart without angina pectoris   . Diastolic dysfunction   . Dyspnea   . 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 History of cellulitis    right great toe 10-25-2014  . History of skin cancer   . HOCM (hypertrophic obstructive cardiomyopathy) ([place])   . Hyperlipidemia associated with type 2 diabetes mellitus ([place]) 10/25/2013  . Hypertension   . Hypertension associated with diabetes ([place]) 10/25/2013  . 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 Insulin dependent type 2 diabetes mellitus ([place]) 1991   followd by [name]--  has insulin pump  . Insulin pump in place   . OSA on CPAP   . Pacemaker  06/03/2015  . Peripheral neuropathy    severe  . Peripheral vascular disease ([place])    bilateral lower extremities  . PVD (peripheral vascular disease) ([place])   . Rib fracture 07/24/2015  . Seasonal and perennial allergic rhinitis 10/25/2013  . Seborrheic keratosis 09/19/2016  . Secondary hyperparathyroidism of renal origin ([place])   . 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 Sinus node dysfunction (HCC)   . Sleep apnea   . Syncope 07/24/2015    Past Surgical History:  Procedure Laterality Date  . AMPUTATION OF REPLICATED TOES  Mar 4562   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)  . AMPUTATION TOE Right 03/12/2015   Procedure: RIGHT HALLUS AMPUTATION ;  Surgeon: [name], MD;  Location: [facility];  Service: Podiatry;  Laterality: Right;  . CARDIAC CATHETERIZATION  11-25-2010   [place], [place]   Nonobstructive CAD  . CARDIAC PACEMAKER PLACEMENT  Nov 2009   Medtronic  . CHOLECYSTECTOMY N/A 03/05/2017   Procedure: LAPAROSCOPIC CHOLECYSTECTOMY WITH INTRAOPERATIVE CHOLANGIOGRAM;  Surgeon: [name], MD;  Location: [facility];  Service: General;  Laterality: N/A;  . EP IMPLANTABLE DEVICE N/A 06/03/2015   Procedure: PPM Generator Changeout;  Surgeon: [name], MD;  Location: [facility];  Service: Cardiovascular;  Laterality: N/A;  . EXCISION BONE CYST Right 03/06/2015   Procedure: BONE BIOPSIES OF RIGHT FOOT;  Surgeon: [name], MD;  Location: [facility];  Service: Podiatry;  Laterality: Right;  . ORIF ANKLE FRACTURE Left 11/06/2014   Procedure: OPEN REDUCTION INTERNAL FIXATION (ORIF) LEFT  ANKLE FRACTURE;  Surgeon: [name], MD;  Location: [facility];  Service: Orthopedics;  Laterality: Left;  . TEE WITHOUT CARDIOVERSION N/A 09/01/2017   Procedure: TRANSESOPHAGEAL ECHOCARDIOGRAM (TEE);  Surgeon: Dorris Carnes  V, MD;  Location: [facility];  Service: Cardiovascular;  Laterality: N/A;  . TOTAL KNEE ARTHROPLASTY    . VEIN LIGATION AND STRIPPING       Family History  Problem Relation Age of Onset  . Cancer Mother        breast  . Heart attack Father   . Stroke Father     Social History:  reports that he quit smoking about 35 years ago. He has a 60.00 pack-year smoking history. He has never used smokeless tobacco. He reports current alcohol use of about 1.0 standard drink of alcohol per week. He reports that he does not use drugs.    Review of Systems         Lipids: Had been on and off Lipitor since late 2014 and is now taking 10 mg.   LDL is below 70 Previously had high ALT with Lipitor but now doing well with 10 mg  HDL is consistently low  He is taking OTC  fish oil  Also taking cholestyramine for other reasons and triglycerides are relatively high   Lab Results  Component Value Date   CHOL 118 12/25/2019   CHOL 108 03/01/2019   CHOL 138 01/06/2019   Lab Results  Component Value Date   HDL 23.60 (L) 12/25/2019   HDL 22.10 (L) 03/01/2019   HDL 23 (L) 01/06/2019   Lab Results  Component Value Date   LDLCALC 73 01/06/2019   LDLCALC 62 07/17/2018   LDLCALC 106 (H) 08/31/2017   Lab Results  Component Value Date   TRIG 217.0 (H) 12/25/2019   TRIG 255.0 (H) 03/01/2019   TRIG 210 (H) 01/06/2019   Lab Results  Component Value Date   CHOLHDL 5 12/25/2019   CHOLHDL 5 03/01/2019   CHOLHDL 6.0 01/06/2019   Lab Results  Component Value Date   LDLDIRECT 45.0 12/25/2019   LDLDIRECT 37.0 03/01/2019   LDLDIRECT 50.0 12/27/2017    Lab Results  Component Value Date   ALT 14 12/25/2019       HYPERTENSION: The blood pressure has been treated with various drugs including carvedilol 25 mg by other physicians.   BP Readings from Last 3 Encounters:  12/31/19 130/62  12/06/19 (!) 125/49  11/08/19 (!) 156/51     Chronic kidney disease: Has variable results, is followed by nephrologist  Creatinine ranges:  Lab Results  Component Value Date   CREATININE 2.50 (H) 12/25/2019   CREATININE 2.80 (H)  12/06/2019   CREATININE 2.67 (H) 11/08/2019         Has history of significant loss of sensation in his feet  since about 2013  He has been prescribed diabetic shoes He is  under the care of a podiatrist, recently regular  Vitamin B12 deficiency: managed with monthly injections  Lab Results  Component Value Date   VITAMINB12 500 03/06/2019   Continues to get treatment for anemia of chronic disease, now on ESA injections Was getting iron infusions for iron deficiency    Physical Examination:  BP 130/62 (BP Location: Left Arm, Patient Position: Sitting, Cuff Size: Large)   Pulse 65   Ht '6\' 4"'  (1.93 m)   Wt 273 lb 6.4 oz (124 kg)   SpO2 96%   BMI 33.28 kg/m  Diabetic Foot Exam - Simple   Simple Foot Form Diabetic Foot exam was performed with the following findings: Yes   Visual Inspection See comments: Yes Sensation Testing See comments: Yes Pulse Check See comments: Yes Comments Amputations of  3 toes on the right present Absent monofilament sensation and pulses in the feet       ASSESSMENT:  Diabetes type 2, with obesity  See history of present illness for detailed discussion of current diabetes management, blood sugar patterns and problems identified  He is on basal bolus insulin injections and Victoza   Again A1c is about the same at 6.9 compared to 6.8; likely falsely low because of his anemia  He still has postprandial hyperglycemia based on his compliance with diet This will cause variability in his blood sugars both before breakfast and dinnertime Not checking readings after meals again interactive Does not understand the need to cover all his carbohydrates such as bread with NovoLog even when he is having snacks in the afternoon or late at night He has difficulty understanding his insulin adjustment and still dependent on his wife to help  Diabetic neuropathy: Discussed foot care principles, he has now scheduled follow-up with podiatrist, currently  not wearing diabetic shoes  Hyperlipidemia: Has persistently high triglycerides but LDL is well controlled   PLAN:   Reminded him to check readings after meals  NovoLog 5 units with any carbohydrate intake like when he is eating a half Sandwiches or otherwise eating large carbohydrate snacks  If his readings after dinner are consistently high we need to increase NovoLog  No change in [place] as yet  Continue 1.8 mg Victoza  Exercise as much as tolerated  Check bottom of feet daily and follow-up with podiatrist as scheduled  No change in lipid-lowering medications   There are no Patient Instructions on file for this visit.      Elayne Snare 12/31/2019, 1:31 PM   Note: This office note was prepared with Dragon voice recognition system technology. Any transcriptional errors that result from this process are unintentional.

## 2020-01-03 ENCOUNTER — Ambulatory Visit (HOSPITAL_COMMUNITY)
Admission: RE | Admit: 2020-01-03 | Discharge: 2020-01-03 | Disposition: A | Discharge status: Home / Self Care | Payer: Medicare Other | Source: Ambulatory Visit | Attending: Nephrology | Admitting: Nephrology

## 2020-01-03 ENCOUNTER — Other Ambulatory Visit: Payer: Self-pay

## 2020-01-03 VITALS — BP 145/45 | HR 63 | Temp 97.3°F | Resp 20

## 2020-01-03 DIAGNOSIS — N184 Chronic kidney disease, stage 4 (severe): Secondary | ICD-10-CM

## 2020-01-03 LAB — IRON AND TIBC
Iron: 51 ug/dL (ref 45–182)
Saturation Ratios: 22 % (ref 17.9–39.5)
TIBC: 237 ug/dL — ABNORMAL LOW (ref 250–450)
UIBC: 186 ug/dL

## 2020-01-03 LAB — POCT HEMOGLOBIN-HEMACUE: Hemoglobin: 10 g/dL — ABNORMAL LOW (ref 13.0–17.0)

## 2020-01-03 LAB — FERRITIN: Ferritin: 255 ng/mL (ref 24–336)

## 2020-01-03 MED ORDER — DARBEPOETIN ALFA 40 MCG/0.4ML IJ SOSY
40.0000 ug | PREFILLED_SYRINGE | INTRAMUSCULAR | Status: DC
  Administered 2020-01-03: 40 ug via SUBCUTANEOUS

## 2020-01-03 MED ORDER — DARBEPOETIN ALFA 40 MCG/0.4ML IJ SOSY
PREFILLED_SYRINGE | INTRAMUSCULAR | Status: AC
  Filled 2020-01-03: qty 0.4

## 2020-01-06 ENCOUNTER — Other Ambulatory Visit: Payer: Self-pay

## 2020-01-06 ENCOUNTER — Other Ambulatory Visit: Payer: Self-pay | Admitting: Family Medicine

## 2020-01-06 MED ORDER — ISOSORBIDE MONONITRATE ER 30 MG PO TB24: 60.0000 mg | ORAL_TABLET | Freq: Every day | ORAL | 0 refills | Status: DC

## 2020-01-10 ENCOUNTER — Other Ambulatory Visit: Payer: Self-pay

## 2020-01-10 ENCOUNTER — Ambulatory Visit (INDEPENDENT_AMBULATORY_CARE_PROVIDER_SITE_OTHER): Payer: Medicare Other | Admitting: Podiatry

## 2020-01-10 ENCOUNTER — Encounter: Payer: Self-pay | Admitting: Podiatry

## 2020-01-10 DIAGNOSIS — B351 Tinea unguium: Secondary | ICD-10-CM

## 2020-01-10 DIAGNOSIS — Z89421 Acquired absence of other right toe(s): Secondary | ICD-10-CM | POA: Diagnosis not present

## 2020-01-10 DIAGNOSIS — L84 Corns and callosities: Secondary | ICD-10-CM | POA: Diagnosis not present

## 2020-01-10 DIAGNOSIS — E1142 Type 2 diabetes mellitus with diabetic polyneuropathy: Secondary | ICD-10-CM

## 2020-01-12 NOTE — Progress Notes (Signed)
Subjective: David [name] presents today for follow up of at risk foot care. Patient has h/o amputation of R hallux and corn(s) left 3rd digit and painful mycotic toenails b/l that are difficult to trim. Pain interferes with ambulation. Aggravating factors include wearing enclosed shoe gear. Pain is relieved with periodic professional debridement.   His wife is present during today's visit. They voice no new pedal concerns on today's visit.  Allergies  Allergen Reactions  . Ativan [Lorazepam] Anxiety    Pt gets more agitated  . Adhesive [Tape] Other (See Comments)    blisters     Objective: There were no vitals filed for this visit.  Pt 79 y.o. year old Caucasian male morbidly obese in NAD. AAO x 3.   Vascular Examination:  Capillary refill time to remaining digits delayed b/l. Faintly palpable DP pulses b/l. Pedal hair absent b/l Skin temperature gradient within normal limits b/l.  Dermatological Examination: Pedal skin with normal turgor, texture and tone bilaterally. No interdigital macerations bilaterally. Toenails L hallux, L 2nd toe, L 3rd toe, L 4th toe, L 5th toe and R 3rd toe elongated, dystrophic, thickened, and crumbly with subungual debris and tenderness to dorsal palpation. Hyperkeratotic lesion(s) L 3rd toe distal tip.  No erythema, no edema, no drainage, no fluctuance.  Musculoskeletal: Normal muscle strength 5/5 to all lower extremity muscle groups bilaterally, no pain crepitus or joint limitation noted with ROM b/l and digital amputation right hallux, right 2nd toe.  Neurological: Protective sensation diminished with 10g monofilament b/l.  Assessment: 1. Onychomycosis   2. Corns   3. Status post amputation of lesser toe of right foot ([place])   4. Diabetic peripheral neuropathy associated with type 2 diabetes mellitus ([place])    Plan: -Examined patient. -Continue diabetic foot care principles. -Toenails L hallux, L 2nd toe, L 3rd toe, L 4th toe, L 5th toe, R 3rd  toe, R 4th toe and R 5th toe debrided in length and girth without iatrogenic bleeding with sterile nail nipper and dremel.  -Patient to continue soft, supportive shoe gear daily. -Patient to report any pedal injuries to medical professional immediately. -Patient/POA to call should there be question/concern in the interim.  Return in about 3 months (around 04/11/2020) for nail trim.

## 2020-01-15 ENCOUNTER — Encounter: Payer: Self-pay | Admitting: Adult Health

## 2020-01-15 ENCOUNTER — Ambulatory Visit (INDEPENDENT_AMBULATORY_CARE_PROVIDER_SITE_OTHER): Payer: Medicare Other | Admitting: Adult Health

## 2020-01-15 ENCOUNTER — Other Ambulatory Visit: Payer: Self-pay

## 2020-01-15 VITALS — BP 106/55 | HR 76 | Ht 76.0 in | Wt 280.0 lb

## 2020-01-15 DIAGNOSIS — I639 Cerebral infarction, unspecified: Secondary | ICD-10-CM | POA: Diagnosis not present

## 2020-01-15 DIAGNOSIS — G44229 Chronic tension-type headache, not intractable: Secondary | ICD-10-CM | POA: Diagnosis not present

## 2020-01-15 DIAGNOSIS — I69319 Unspecified symptoms and signs involving cognitive functions following cerebral infarction: Secondary | ICD-10-CM

## 2020-01-15 NOTE — Progress Notes (Signed)
[facility] [address] [place]. [name] [address] [phone]       OFFICE FOLLOW UP NOTE  [name] Date of Birth:  25-Feb-1941 Medical Record Number:  [id number]   Reason for visit: Stroke follow up  CHIEF COMPLAINT:  Chief Complaint  Patient presents with  . Cerebrovascular Accident    tx rm here for a f/u from a stroke. Pt said he is having the same sx. Pt is here with his wife. She said he is more SOB and fatigued. He also have been getting bad headaches.    HPI:  Today, 01/15/2020, [name] returns for stroke follow-up  Residual deficits of cognitive impairment which has been stable MMSE today 24/30 (prior 21/30) Denies any new or worsening stroke/TIA symptoms  Remains on Plavix without bleeding or bruising Remains on atorvastatin without myalgias Recent lipid panel showed direct LDL 45 Blood pressure today 106/55 Glucose levels stable with recent A1c 6.9 currently managed by endocrinology  Does have complaints of increased fatigue, shortness of breath and occasional headaches has follow-up on Friday with sleep apnea provider to ensure adequate management with CPAP use SOB only with exertion which has been gradually worsening over the past 6 months -he was advised to follow-up with cardiology for further evaluation but also discussed possible deconditioning due to sedentary lifestyle Reports experiencing approximately 3 headaches since prior visit 6 months ago.  Consistent of top of head pressure type pain limiting daily activity but denies any associated symptoms such as photophobia, phonophobia, N/V, visual changes or any other neurological symptoms.  Mild benefit with use of Tylenol.  No further concerns at this time      History provided for reference purposes only Update 06/26/2019 JM: [name] who is being seen today for follow-up regarding history of stroke and post stroke cognitive impairment  accompanied by his wife.  Short term memory has been stable since prior visit without worsening but denies improvement.  He has continued to do word searchs and word puzzles to help with memory as previously recommended. MMSE 21/30.  After prior visit: Dementia panel showed elevated homocysteine level therefore folic acid 1 mg daily initiated which he endorses ongoing compliance.  EEG did show mild slowing but otherwise unremarkable.  Continues on Plavix and atorvastatin for secondary stroke prevention side effects. He does report that he had a new onset headache that started on Monday afternoon and lasted all day Tuesday. He received his COVID vaccine on Friday prior to the headache.  Located left temporal with pressure sensation relieved by Tylenol and rest.  He denies any visual changes, gait/balance impairments or any other associated symptoms. Blood pressure today 134/78.  Denies new or worsening stroke/TIA symptoms.  No further concerns at this time.  Update 03/06/2019 PS : [name] for follow-up after last visit 6 months ago with Janett Billow nurse practitioner.  He was admitted to [facility] with confusion and disorientation in the setting of significantly elevated blood pressure on 01/05/2019.  MRI scan of the brain was obtained which showed no acute abnormality.  EEG showed mild generalized slowing.  Patient disorientation confusion gradual return back to baseline in 2 to 3 weeks with the wife has noticed that his being quite forgetful and not able to remember recent information.  His short-term memory is poor.  He has times in fact demanding names of his neighbors.  He finds this frustrating.  However remains quite independent at home and is  managing all his activities of daily living.  He can be left alone for several hours.  He ambulates with a cane and his balance is poor.  Has had a few falls but no major injuries.  He does have family history of dementia in his dad also after a stroke and  worries about it.  He remains on Plavix which is tolerating well with minor bruising but no bleeding episodes.  His blood pressure is under good control today it is 141/60.  He states his sugars are also under good control.  Is tolerating Lipitor well without muscle aches and pains.   Virtual video visit 09/04/2018 :  David [name] is a 79 y.o. malewith underlying history of diabetes, hypertension, significant heart disease status post pacemaker placement and recent diagnosis of right frontal cortex posteriorly compatible with acute infarction on 08/30/17 after presenting with left sided weakness. He was initially scheduled for office follow up today but due to COVID-19 safety precautions, visit transitioned to telemedicine visit via WebEx. He has been doing well from a stroke standpoint with residual deficits of occasional balance difficulties. He will use a cane when out of the house.  No recent falls.   Continues on Plavix and atorvastatin without reported side effects.  Lipid panel 07/17/2018 LDL 62.  Blood pressure elevated at 184/84. Blood pressure has been elevated for the past 2 months per wife. He follows with nephrology with slowly increasing hydralazine with last increase approx 4 weeks prior.  Follow-up appointment with nephrology scheduled next week but rescheduled due to COVID-19 50 precautions.  Asymptomatic hypertension but does endorse increase fatigue over the past month.  Per wife, fatigue complaints extensively worked up by PCP which was unremarkable and felt fatigue likely related to medication use.  History of OSA and reported compliance with CPAP.  Glucose levels avg 160.  A1c 7.9 on 07/17/2018. He follows with endocrinology.  His wife endorses worsening short-term memory such as having a conversation or being told something and forgetting shortly after.  This is been present for approximately the past month.  No further concerns at this time.  Denies new or worsening stroke/TIA  symptoms.  Interval history 03/05/2018: Patient returns today for follow up appointment with his wife. Wife states he has been making improvements daily. He continues to use cane on occasion but more for "security" reasons. Completed PT/OT since previous visit but remains active with his gym exercising. Continues to take plavix without bleeding but mild bruising. Continues lipitor without side effects of myalgias with recent LDL 50. Patient has returned to driving without complications along with all other activities except for golfing due to intermittent balance issues which he believes is from arthritis in his knees and decreased stamina. Blood pressure today 184/67, asymptomatic.  Rechecked manual at 178/68. Monitors at home intermittently as it was previously stable. Glucose levels have been stable as he does monitor at home.  No further concerns at this time.  Denies new or worsening stroke/TIA symptoms.  10/31/2017 visit: Patient is being seen today for hospital follow-up and is accompanied by his wife.  Overall he continues to do well and continues to improve with PT/OT at the neuro rehab clinic [place].  He continues to take Plavix with mild bruising but no bleeding.  Continues to take Lipitor without side effects myalgias.  BP satisfactory 102/53.  He has returned to all previous activities except for driving as OT has stated that he is not quite there at multitasking at.  He  states he feels a very mild weakness in his left side and does use a cane for stability but he did use this prior to the stroke as well.  Denies new or worsening stroke/TIA symptoms.  Stroke admission 08/30/2017: [name] is a 79 y.o. male with history of diabetes, hypertension, significant heart disease status post pacemaker placement who presented with L sided weakness x 2 days.  CT head reviewed showed no acute stroke.  MRI reviewed and showed 2 small right frontal cortical infarcts which are likely embolic  secondary to cryptogenic source.  Carotid Dopplers showed right ICA stenosis of 1 to 39% but study was limited in left carotid and ended due to patient's combativeness.  2D echo showed EF of 60 to 65%.  TEE was negative for thrombus and PFO.  Pacemaker interrogated which did not show evidence of A. fib.  LDL 6 as patient was previously on fish oil was recommended to start Lipitor 10 mg.  A1c 7.6 and recommended close PCP follow-up.  Recommended DAPT of aspirin and Plavix for 3 weeks followed by Plavix alone as patient was previously on aspirin 81 mg.  Therapy is recommended CIR for continued therapies.       ROS:   14 system review of systems performed and negative with exception of single episode of dyspnea on exertion, headache, fatigue and short term memory.  PMH:  Past Medical History:  Diagnosis Date  . Acute cholecystitis s/p lap cholecystectomy 03/05/2017 03/04/2017  . Anemia, iron deficiency   . Anxiety   . Arthritis   . Atrial fibrillation with rapid ventricular response ([place])   . BPH (benign prostatic hypertrophy)   . CAD (coronary artery disease)    Nonobstructive CAD per cath  . Cardiac pacemaker in situ   . CHF (congestive heart failure) ([place])   . Chronic ulcer of right foot ([place])   . CKD (chronic kidney disease) stage 4, GFR 15-29 ml/min (HCC) 09/26/2017  . 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 Coronary artery disease involving native coronary artery of native heart without angina pectoris   . Diastolic dysfunction   . Dyspnea   . 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 History of cellulitis    right great toe 10-25-2014  . History of skin cancer   . HOCM (hypertrophic obstructive cardiomyopathy) ([place])   . Hyperlipidemia associated with type 2 diabetes mellitus ([place]) 10/25/2013  . Hypertension   . Hypertension associated with diabetes ([place]) 10/25/2013  . IDDM (insulin  dependent diabetes mellitus) - on insulin pump 03/04/2017  . Insulin dependent type 2 diabetes mellitus ([place]) 1991   followd by [name]--  has insulin pump  . Insulin pump in place   . OSA on CPAP   . 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 Peripheral neuropathy    severe  . Peripheral vascular disease ([place])    bilateral lower extremities  . PVD (peripheral vascular disease) ([place])   . Rib fracture 07/24/2015  . Seasonal and perennial allergic rhinitis 10/25/2013  . Seborrheic keratosis 09/19/2016  . Secondary hyperparathyroidism of renal origin ([place])   . 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 Sinus node dysfunction (H[facility])   . Sleep apnea   . Syncope 07/24/2015    PSH:  Past Surgical History:  Procedure Laterality Date  . AMPUTATION OF REPLICATED TOES  Mar 5883   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)  . AMPUTATION  TOE Right 03/12/2015   Procedure: RIGHT HALLUS AMPUTATION ;  Surgeon: [name], MD;  Location: [facility];  Service: Podiatry;  Laterality: Right;  . CARDIAC CATHETERIZATION  11-25-2010   [place], [place]   Nonobstructive CAD  . CARDIAC PACEMAKER PLACEMENT  Nov 2009   [employer]  . CHOLECYSTECTOMY N/A 03/05/2017   Procedure: LAPAROSCOPIC CHOLECYSTECTOMY WITH INTRAOPERATIVE CHOLANGIOGRAM;  Surgeon: [name], MD;  Location: [facility];  Service: General;  Laterality: N/A;  . EP IMPLANTABLE DEVICE N/A 06/03/2015   Procedure: PPM Generator Changeout;  Surgeon: [name], MD;  Location: [facility];  Service: Cardiovascular;  Laterality: N/A;  . EXCISION BONE CYST Right 03/06/2015   Procedure: BONE BIOPSIES OF RIGHT FOOT;  Surgeon: [name], MD;  Location: [facility];  Service: Podiatry;  Laterality: Right;  . ORIF ANKLE FRACTURE Left 11/06/2014   Procedure: OPEN REDUCTION INTERNAL FIXATION (ORIF) LEFT  ANKLE FRACTURE;  Surgeon: [name], MD;  Location: [facility];  Service: Orthopedics;  Laterality: Left;  . TEE WITHOUT CARDIOVERSION N/A  09/01/2017   Procedure: TRANSESOPHAGEAL ECHOCARDIOGRAM (TEE);  Surgeon: [name], MD;  Location: [facility];  Service: Cardiovascular;  Laterality: N/A;  . TOTAL KNEE ARTHROPLASTY    . VEIN LIGATION AND STRIPPING      Social History:  Social History   Socioeconomic History  . Marital status: Married    Spouse name: Not on file  . Number of children: 3  . Years of education: 53  . Highest education level: Not on file  Occupational History  . Occupation: Retired  Tobacco Use  . Smoking status: Former Smoker    Packs/day: 2.00    Years: 30.00    Pack years: 60.00    Quit date: 03/03/1984    Years since quitting: 35.8  . Smokeless tobacco: Never Used  Vaping Use  . Vaping Use: Never used  Substance and Sexual Activity  . Alcohol use: Yes    Alcohol/week: 1.0 standard drink    Types: 1 Glasses of wine per week    Comment: social  . Drug use: No  . Sexual activity: Not Currently  Other Topics Concern  . Not on file  Social History Narrative   Currently resides with his wife. 1 dog. Fun/Hobby: Golf    Denies any religious beliefs effecting health care.    Social Determinants of Health   Financial Resource Strain:   . Difficulty of Paying Living Expenses: Not on file  Food Insecurity:   . Worried About Charity fundraiser in the Last Year: Not on file  . Ran Out of Food in the Last Year: Not on file  Transportation Needs:   . Lack of Transportation (Medical): Not on file  . Lack of Transportation (Non-Medical): Not on file  Physical Activity:   . Days of Exercise per Week: Not on file  . Minutes of Exercise per Session: Not on file  Stress:   . Feeling of Stress : Not on file  Social Connections:   . Frequency of Communication with Friends and Family: Not on file  . Frequency of Social Gatherings with Friends and Family: Not on file  . Attends Religious Services: Not on file  . Active Member of Clubs or Organizations: Not on file  . Attends Theatre manager Meetings: Not on file  . Marital Status: Not on file  Intimate Partner Violence:   . Fear of Current or Ex-Partner: Not on file  .  Emotionally Abused: Not on file  . Physically Abused: Not on file  . Sexually Abused: Not on file    Family History:  Family History  Problem Relation Age of Onset  . Cancer Mother        breast  . Heart attack Father   . Stroke Father     Medications:   Current Outpatient Medications on File Prior to Visit  Medication Sig Dispense Refill  . acetaminophen (TYLENOL) 325 MG tablet Take 1-2 tablets (325-650 mg total) by mouth every 4 (four) hours as needed for mild pain.    Marland Kitchen amLODipine (NORVASC) 5 MG tablet Take 1 tablet (5 mg total) by mouth daily. 90 tablet 1  . atorvastatin (LIPITOR) 10 MG tablet TAKE 1 TABLET BY MOUTH ONCE DAILY AT  6PM 90 tablet 1  . blood glucose meter kit and supplies KIT Dispense based on patient and insurance preference. Use up to four times daily as directed. (FOR ICD-9 250.00, 250.01). 1 each 0  . calcitRIOL (ROCALTROL) 0.25 MCG capsule Take 1 capsule by mouth once daily 90 capsule 0  . carvedilol (COREG) 25 MG tablet TAKE 1 TABLET BY MOUTH TWICE DAILY WITH A MEAL 180 tablet 0  . cholecalciferol (VITAMIN D) 1000 units tablet Take 1 tablet (1,000 Units total) by mouth daily. 30 tablet 1  . cholestyramine light (PREVALITE) 4 g packet Take 1 packet (4 g total) by mouth 2 (two) times daily. 60 each 5  . clopidogrel (PLAVIX) 75 MG tablet TAKE 1 TABLET BY MOUTH EVERY DAY 90 tablet 0  . folic acid (FOLVITE) 1 MG tablet Take 1 mg by mouth daily.    Marland Kitchen glucose blood test strip Use Contour test strips as instructed to check blood sugar four times daily. 200 each 2  . hydrALAZINE (APRESOLINE) 100 MG tablet Take 1 tablet (100 mg total) by mouth 2 (two) times daily. 180 tablet 0  . insulin degludec (TRESIBA FLEXTOUCH) 200 UNIT/ML FlexTouch Pen INJECT 44 UNITS SUBCUTANEOUSLY ONCE DAILY 9 mL 2  . Insulin Syringe-Needle U-100  (INSULIN SYRINGE 1CC/31GX5/16") 31G X 5/16" 1 ML MISC 1 each by Does not apply route 3 (three) times daily. Use insulin syringe to inject insulin three times daily. 100 each 2  . isosorbide mononitrate (IMDUR) 30 MG 24 hr tablet Take 2 tablets (60 mg total) by mouth daily. Please make overdue appt with Dr. Caryl Comes before anymore refills. 1st attempt 60 tablet 0  . NOVOLOG FLEXPEN 100 UNIT/ML FlexPen Inject 10 units under the skin before the first meal of the day and 12 units at dinner. 15 mL 1  . Omega-3 Fatty Acids (FISH OIL) 1000 MG CAPS Take 1 capsule (1,000 mg total) by mouth every morning. 30 capsule 1  . pantoprazole (PROTONIX) 40 MG tablet TAKE 1 TABLET BY MOUTH EVERY DAY 90 tablet 0  . torsemide (DEMADEX) 20 MG tablet Take 2 tablets (56m) by mouth every OTHER day. 180 tablet 3  . VICTOZA 18 MG/3ML SOPN INJECT 0.3ML (1.8MG TOTAL) INTO THE SKIN DAILY BEFORE SUPPER 9 mL 2  . vitamin B-12 (CYANOCOBALAMIN) 100 MCG tablet Take 100 mcg by mouth daily.    . sertraline (ZOLOFT) 100 MG tablet Take 1 tablet (100 mg total) by mouth daily. 90 tablet 1   No current facility-administered medications on file prior to visit.    Allergies:   Allergies  Allergen Reactions  . Ativan [Lorazepam] Anxiety    Pt gets more agitated  . Adhesive [Tape] Other (See Comments)  blisters     Physical Exam  Vitals:   01/15/20 0844  BP: (!) 106/55  Pulse: 76  Weight: 280 lb (127 kg)  Height: _0  (1.93 m)   Body mass index is 34.08 kg/m. No exam data present  General: Obese pleasant elderly Caucasian male, seated, in no evident distress Head: head normocephalic and atraumatic.   Neck: supple with no carotid or supraclavicular bruits Cardiovascular: regular rate and rhythm, no murmurs Musculoskeletal: no deformity Skin:  BLE edema with erythema Vascular:  Normal pulses all extremities  Neurologic Exam Mental Status: Awake and fully alert. Fluent speech and language.  Recent memory impaired and  remote memory intact. Attention span, concentration and fund of knowledge appropriate during visit. Mood and affect appropriate.  MMSE - Mini Mental State Exam 01/15/2020 06/26/2019 06/22/2017  Not completed: - (No Data) (No Data)  Orientation to time 3 4 -  Orientation to Place 4 3 -  Registration 3 3 -  Attention/ Calculation 3 1 -  Recall 2 2 -  Language- name 2 objects 2 2 -  Language- repeat 1 1 -  Language- follow 3 step command 3 3 -  Language- read & follow direction 1 1 -  Write a sentence 1 0 -  Copy design 1 1 -  Total score 24 21 -   Cranial Nerves: Pupils equal, briskly reactive to light. Extraocular movements full without nystagmus. Visual fields full to confrontation. Hard of hearin. Facial sensation intact. Face, tongue, palate moves normally and symmetrically.  Motor: Normal bulk and tone. Normal strength in all tested extremity muscles.  Sensory.:  Intact to touch , pinprick , position and vibratory sensation.  Coordination: Rapid alternating movements normal in all extremities. Finger-to-nose and heel-to-shin performed accurately bilaterally.   Gait and Station: Stands from seated position without difficulty.  Ambulates with rolling walker with mild gait impairment.  Tandem walk not attempted. Reflexes: 1+ and symmetric. Toes downgoing.       ASSESSMENT/PLAN: PIERCEN COVINO is a 79 y.o. year old male here with 2 small right frontal cortical infarcts on 08/30/2017 secondary to cryptogenic source. Vascular risk factors include DM, HTN and HLD. Patient had recent admission in August 2020 secondary to confusion and disorientation in setting of hypertensive urgency but now persistent mild short term memory impairment which has been stable.   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  Also  highly encouraged increasing activity level due to his sedentary lifestyle since retirement, ensure healthy diet and adequate sleeping and importance of stroke risk factor management  Hx of stroke -Continue Plavix and Lipitor for stroke secondary stroke prevention  -Continue to follow PCP/cardiology/endocrinology for HTN, HLD and DM management -Maintain strict control of hypertension with blood pressure goal below 130/90, lipids with LDL cholesterol goal below 70 mg percent and diabetes with hemoglobin A1c goal below 6.5%.   -Ongoing use of CPAP for OSA management -has follow-up scheduled this Friday for further discussion regarding increased daytime fatigue  Headache, intermittent, chronic -Based on symptoms, appears more tension related -No migrainous type features or red flag symptoms -No indication for further evaluation at this time or need of medication management    Follow-up in 6 months or call earlier if needed    I spent 30 minutes of face-to-face and non-face-to-face time with patient and wife.  This included previsit chart review,  lab review, study review, order entry, electronic health record documentation, patient education regarding mild cognitive impairment post stroke, history of prior strokes, importance of managing stroke risk factors, intermittent occasional headaches, answered all questions to patient and wife satisfaction    Frann Rider, AGNP-BC  [facility] [address] [place], [place] [address]  Phone [phone] Fax [phone] Note: This document was prepared with digital dictation and possible smart phrase technology. Any transcriptional errors that result from this process are unintentional.

## 2020-01-15 NOTE — Progress Notes (Signed)
I agree with the above plan 

## 2020-01-16 NOTE — Progress Notes (Signed)
Date:  01/17/2020   ID:  David [name], DOB [birth year], MRN [id number]  PCP:  [name], MD  Sleep medicine:  Fransico Him, MD Electrophysiologist:  None   Chief Complaint:  OSA  History of Present Illness:    David [name] is a 79 y.o. male with a hx of moderate OSA with an AHI of 25.3/hr and O2 desats of 84% and was started on CPAP. he is doing well with his CPAP device and thinks that he has gotten used to it.  He tolerates the mask and feels the pressure is adequate.  Since going on CPAP he feels rested in the am but then gets tired.  He denies any significant mouth or nasal dryness or nasal congestion.  He does not think that he snores.  His wife says that he is sleeping a lot during the day but is being treated for anemia.  Prior CV studies:   The following studies were reviewed today:  PAP compliance download  Past Medical History:  Diagnosis Date  . Acute cholecystitis s/p lap cholecystectomy 03/05/2017 03/04/2017  . Anemia, iron deficiency   . Anxiety   . Arthritis   . Atrial fibrillation with rapid ventricular response ([place])   . BPH (benign prostatic hypertrophy)   . CAD (coronary artery disease)    Nonobstructive CAD per cath  . Cardiac pacemaker in situ   . CHF (congestive heart failure) ([place])   . Chronic ulcer of right foot ([place])   . CKD (chronic kidney disease) stage 4, GFR 15-29 ml/min (HCC) 09/26/2017  . 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 Coronary artery disease involving native coronary artery of native heart without angina pectoris   . Diastolic dysfunction   . Dyspnea   . 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 History of cellulitis    right great toe 10-25-2014  . History of skin cancer   . HOCM (hypertrophic obstructive cardiomyopathy) ([place])   . Hyperlipidemia associated with type 2 diabetes mellitus ([place]) 10/25/2013  . Hypertension   .  Hypertension associated with diabetes ([place]) 10/25/2013  . 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 Insulin pump in place   . OSA on CPAP   . 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 Peripheral neuropathy    severe  . Peripheral vascular disease ([place])    bilateral lower extremities  . PVD (peripheral vascular disease) ([place])   . Rib fracture 07/24/2015  . Seasonal and perennial allergic rhinitis 10/25/2013  . Seborrheic keratosis 09/19/2016  . Secondary hyperparathyroidism of renal origin ([place])   . 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 Sinus node dysfunction (HCC)   . Sleep apnea   . Syncope 07/24/2015   Past Surgical History:  Procedure Laterality Date  . AMPUTATION OF REPLICATED TOES  Mar 6803   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)  . AMPUTATION TOE Right 03/12/2015   Procedure: RIGHT HALLUS AMPUTATION ;  Surgeon: [name], MD;  Location: [facility];  Service: Podiatry;  Laterality: Right;  . CARDIAC CATHETERIZATION  11-25-2010   [place], [place]   Nonobstructive CAD  . CARDIAC PACEMAKER PLACEMENT  Nov 2009   [employer]  . CHOLECYSTECTOMY N/A 03/05/2017   Procedure: LAPAROSCOPIC CHOLECYSTECTOMY WITH INTRAOPERATIVE CHOLANGIOGRAM;  Surgeon: [name],  MD;  Location: [facility];  Service: General;  Laterality: N/A;  . EP IMPLANTABLE DEVICE N/A 06/03/2015   Procedure: PPM Generator Changeout;  Surgeon: [name], MD;  Location: [facility];  Service: Cardiovascular;  Laterality: N/A;  . EXCISION BONE CYST Right 03/06/2015   Procedure: BONE BIOPSIES OF RIGHT FOOT;  Surgeon: [name], MD;  Location: [facility];  Service: Podiatry;  Laterality: Right;  . ORIF ANKLE FRACTURE Left 11/06/2014   Procedure: OPEN REDUCTION INTERNAL FIXATION (ORIF) LEFT  ANKLE FRACTURE;  Surgeon: [name], MD;  Location: [facility];  Service:  Orthopedics;  Laterality: Left;  . TEE WITHOUT CARDIOVERSION N/A 09/01/2017   Procedure: TRANSESOPHAGEAL ECHOCARDIOGRAM (TEE);  Surgeon: [name], MD;  Location: [facility];  Service: Cardiovascular;  Laterality: N/A;  . TOTAL KNEE ARTHROPLASTY    . VEIN LIGATION AND STRIPPING       Current Meds  Medication Sig  . acetaminophen (TYLENOL) 325 MG tablet Take 1-2 tablets (325-650 mg total) by mouth every 4 (four) hours as needed for mild pain.  Marland Kitchen amLODipine (NORVASC) 5 MG tablet Take 1 tablet (5 mg total) by mouth daily.  Marland Kitchen atorvastatin (LIPITOR) 10 MG tablet TAKE 1 TABLET BY MOUTH ONCE DAILY AT  6PM  . blood glucose meter kit and supplies KIT Dispense based on patient and insurance preference. Use up to four times daily as directed. (FOR ICD-9 250.00, 250.01).  . calcitRIOL (ROCALTROL) 0.25 MCG capsule Take 1 capsule by mouth once daily  . carvedilol (COREG) 25 MG tablet TAKE 1 TABLET BY MOUTH TWICE DAILY WITH A MEAL  . cholecalciferol (VITAMIN D) 1000 units tablet Take 1 tablet (1,000 Units total) by mouth daily.  . cholestyramine light (PREVALITE) 4 g packet Take 4 g by mouth daily.  . clopidogrel (PLAVIX) 75 MG tablet TAKE 1 TABLET BY MOUTH EVERY DAY  . folic acid (FOLVITE) 1 MG tablet Take 1 mg by mouth daily.  Marland Kitchen glucose blood test strip Use Contour test strips as instructed to check blood sugar four times daily.  . hydrALAZINE (APRESOLINE) 100 MG tablet Take 1 tablet (100 mg total) by mouth 2 (two) times daily.  . insulin degludec (TRESIBA FLEXTOUCH) 200 UNIT/ML FlexTouch Pen INJECT 44 UNITS SUBCUTANEOUSLY ONCE DAILY  . Insulin Syringe-Needle U-100 (INSULIN SYRINGE 1CC/31GX5/16") 31G X 5/16" 1 ML MISC 1 each by Does not apply route 3 (three) times daily. Use insulin syringe to inject insulin three times daily.  . isosorbide mononitrate (IMDUR) 30 MG 24 hr tablet Take 2 tablets (60 mg total) by mouth daily. Please make overdue appt with Dr. Caryl Comes before anymore refills. 1st attempt  .  NOVOLOG FLEXPEN 100 UNIT/ML FlexPen Inject 10 units under the skin before the first meal of the day and 12 units at dinner.  . Omega-3 Fatty Acids (FISH OIL) 1000 MG CAPS Take 1 capsule (1,000 mg total) by mouth every morning.  . pantoprazole (PROTONIX) 40 MG tablet TAKE 1 TABLET BY MOUTH EVERY DAY  . torsemide (DEMADEX) 20 MG tablet Take 2 tablets (32m) by mouth every OTHER day.  .Marland KitchenVICTOZA 18 MG/3ML SOPN INJECT 0.3ML (1.8MG TOTAL) INTO THE SKIN DAILY BEFORE SUPPER  . vitamin B-12 (CYANOCOBALAMIN) 100 MCG tablet Take 100 mcg by mouth daily.     Allergies:   Ativan [lorazepam] and Adhesive [tape]   Social History   Tobacco Use  . Smoking status: Former Smoker    Packs/day: 2.00    Years: 30.00  Pack years: 60.00    Quit date: 03/03/1984    Years since quitting: 35.8  . Smokeless tobacco: Never Used  Vaping Use  . Vaping Use: Never used  Substance Use Topics  . Alcohol use: Yes    Alcohol/week: 1.0 standard drink    Types: 1 Glasses of wine per week    Comment: social  . Drug use: No     Family Hx: The patient's family history includes Cancer in his mother; Heart attack in his father; Stroke in his father.  ROS:   Please see the history of present illness.     All other systems reviewed and are negative.   Labs/Other Tests and Data Reviewed:    Recent Labs: 03/06/2019: TSH 2.410 12/25/2019: ALT 14; BUN 52; Creatinine, Ser 2.50; Potassium 4.4; Sodium 139 01/03/2020: Hemoglobin 10.0   Recent Lipid Panel Lab Results  Component Value Date/Time   CHOL 118 12/25/2019 01:49 PM   TRIG 217.0 (H) 12/25/2019 01:49 PM   HDL 23.60 (L) 12/25/2019 01:49 PM   CHOLHDL 5 12/25/2019 01:49 PM   LDLCALC 73 01/06/2019 05:58 AM   LDLDIRECT 45.0 12/25/2019 01:49 PM    Wt Readings from Last 3 Encounters:  01/17/20 278 lb (126.1 kg)  01/15/20 280 lb (127 kg)  12/31/19 273 lb 6.4 oz (124 kg)     Objective:    Vital Signs:  BP 130/60   Pulse 71   Ht '6\' 4"'  (1.93 m)   Wt 278 lb  (126.1 kg)   SpO2 97%   BMI 33.84 kg/m    GEN: Well nourished, well developed in no acute distress HEENT: Normal NECK: No JVD; No carotid bruits LYMPHATICS: No lymphadenopathy CARDIAC:RRR, no murmurs, rubs, gallops RESPIRATORY:  Clear to auscultation without rales, wheezing or rhonchi  ABDOMEN: Soft, non-tender, non-distended MUSCULOSKELETAL:  Chronic venous stasis changes with trace LE edema  SKIN: Warm and dry NEUROLOGIC:  Alert and oriented x 3 PSYCHIATRIC:  Normal affect    ASSESSMENT & PLAN:    1.  OSA - The patient is tolerating PAP therapy well without any problems. The PAP download was reviewed today and showed an AHI of 0.5/hr on auto PAP cm H2O with 97% compliance in using more than 4 hours nightly.  The patient has been using and benefiting from PAP use and will continue to benefit from therapy.   2.  HTN  -BP controlled on exam -continue Carvedilol 8m BID, amlodipine 510mdaily and Hydralazine 10061mID  3.  Morbid Obesity  -I have encouraged him to get into a routine exercise program and cut back on carbs and portions.   Medication Adjustments/Labs and Tests Ordered: Current medicines are reviewed at length with the patient today.  Concerns regarding medicines are outlined above.  Tests Ordered: No orders of the defined types were placed in this encounter.  Medication Changes: No orders of the defined types were placed in this encounter.   Disposition:  Follow up in 1 year(s)  Signed, TraFransico HimD  01/17/2020 3:17 PM    [facility]

## 2020-01-17 ENCOUNTER — Ambulatory Visit (INDEPENDENT_AMBULATORY_CARE_PROVIDER_SITE_OTHER): Payer: Medicare Other | Admitting: Cardiology

## 2020-01-17 ENCOUNTER — Other Ambulatory Visit: Payer: Self-pay

## 2020-01-17 ENCOUNTER — Encounter: Payer: Self-pay | Admitting: Cardiology

## 2020-01-17 VITALS — BP 130/60 | HR 71 | Ht 76.0 in | Wt 278.0 lb

## 2020-01-17 DIAGNOSIS — I2510 Atherosclerotic heart disease of native coronary artery without angina pectoris: Secondary | ICD-10-CM | POA: Diagnosis not present

## 2020-01-17 DIAGNOSIS — I10 Essential (primary) hypertension: Secondary | ICD-10-CM

## 2020-01-17 DIAGNOSIS — G4733 Obstructive sleep apnea (adult) (pediatric): Secondary | ICD-10-CM

## 2020-01-17 DIAGNOSIS — Z9989 Dependence on other enabling machines and devices: Secondary | ICD-10-CM | POA: Diagnosis not present

## 2020-01-17 NOTE — Patient Instructions (Signed)

## 2020-01-21 ENCOUNTER — Other Ambulatory Visit: Payer: Self-pay

## 2020-01-21 DIAGNOSIS — E113312 Type 2 diabetes mellitus with moderate nonproliferative diabetic retinopathy with macular edema, left eye: Secondary | ICD-10-CM | POA: Diagnosis not present

## 2020-01-21 DIAGNOSIS — E113313 Type 2 diabetes mellitus with moderate nonproliferative diabetic retinopathy with macular edema, bilateral: Secondary | ICD-10-CM | POA: Diagnosis not present

## 2020-01-21 MED ORDER — TORSEMIDE 20 MG PO TABS: ORAL_TABLET | ORAL | 0 refills | Status: DC

## 2020-01-22 ENCOUNTER — Ambulatory Visit (INDEPENDENT_AMBULATORY_CARE_PROVIDER_SITE_OTHER): Payer: Medicare Other | Admitting: Family Medicine

## 2020-01-22 ENCOUNTER — Ambulatory Visit: Payer: Self-pay

## 2020-01-22 ENCOUNTER — Encounter: Payer: Self-pay | Admitting: Family Medicine

## 2020-01-22 ENCOUNTER — Other Ambulatory Visit: Payer: Self-pay

## 2020-01-22 VITALS — BP 116/60 | HR 75 | Ht 76.0 in | Wt 281.8 lb

## 2020-01-22 DIAGNOSIS — G8929 Other chronic pain: Secondary | ICD-10-CM | POA: Diagnosis not present

## 2020-01-22 DIAGNOSIS — M25562 Pain in left knee: Secondary | ICD-10-CM

## 2020-01-22 DIAGNOSIS — I2510 Atherosclerotic heart disease of native coronary artery without angina pectoris: Secondary | ICD-10-CM | POA: Diagnosis not present

## 2020-01-22 NOTE — Patient Instructions (Addendum)
You had a L knee injection today.  Call or go to the ER if you develop a large red swollen joint with extreme pain or oozing puss.   Call or go to the ER if you develop a large red swollen joint with extreme pain or oozing puss.   If the shot does not last 3 months next step is gel shots.  Give me 1 week notice and I will get the shots approved.

## 2020-01-22 NOTE — Progress Notes (Signed)
I, Wendy Poet, LAT, ATC, am serving as scribe for Dr. Lynne Leader.  David [name] is a 79 y.o. male who presents to [place] at [facility] today for f/u of L knee pain.  He was last seen by Dr. Georgina Snell on 11/07/19 and had a L knee injection.  Since his last visit, pt reports that has L knee pain has returned x 3-4 weeks.  He denies any L knee swelling or mechanical symptoms.   Diagnostic imaging: L knee XR- 09/04/17  Pertinent review of systems: No fevers or chills  Relevant historical information: General poor health.  History of CVA, heart disease, chronic kidney disease, and diabetes.   Exam:  BP 116/60 (BP Location: Left Arm, Patient Position: Sitting, Cuff Size: Large)   Pulse 75   Ht 6\' 4"  (1.93 m)   Wt 281 lb 12.8 oz (127.8 kg)   SpO2 97%   BMI 34.30 kg/m  General: Well Developed, well nourished, and in no acute distress.   MSK: Left knee large effusion.  Normal-appearing otherwise without erythema Nontender. Decreased range of motion of flexion. Stable ligamentous exam.    Lab and Radiology Results Procedure: Real-time Ultrasound Guided Injection of left knee superior lateral patellar space Device: Philips Affiniti 50G Images permanently stored and available for review in PACS Verbal informed consent obtained.  Discussed risks and benefits of procedure. Warned about infection bleeding damage to structures skin hypopigmentation and fat atrophy among others. Patient expresses understanding and agreement Time-out conducted.   Noted no overlying erythema, induration, or other signs of local infection.   Skin prepped in a sterile fashion.   Local anesthesia: Topical Ethyl chloride.   With sterile technique and under real time ultrasound guidance:  40 mg of Kenalog and 2 mL of Marcaine injected easily.   Completed without difficulty   Pain immediately resolved suggesting accurate placement of the medication.   Advised to call if fevers/chills,  erythema, induration, drainage, or persistent bleeding.   Images permanently stored and available for review in the ultrasound unit.  Impression: Technically successful ultrasound guided injection.    Assessment and Plan: 79 y.o. male with left knee pain due to DJD.  Patient has a moderate effusion today which is typically well treatable with steroid injection.  Plan for repeat steroid injection.  Last injection was almost 3 months ago.  We will continue to do steroid injections if these can last about 3 months.  If not we will switch to hyaluronic acid injections.  Patient is not a good surgical candidate for total knee replacement due to his general overall poor health so we will try conservative management as long as he possibly can.  Ultimately if I am not able to get his pain controlled could consider geniculate nerve ablation however that is somewhat of a last resort.    Orders Placed This Encounter  Procedures  . [name] LIMITED JOINT SPACE STRUCTURES LOW LEFT(NO LINKED CHARGES)    Order Specific Question:   Reason for Exam (SYMPTOM  OR DIAGNOSIS REQUIRED)    Answer:   L knee pain    Order Specific Question:   Preferred imaging location?    Answer:   [place]   No orders of the defined types were placed in this encounter.    Discussed warning signs or symptoms. Please see discharge instructions. Patient expresses understanding.   The above documentation has been reviewed and is accurate and complete Lynne Leader, [name]

## 2020-01-24 ENCOUNTER — Other Ambulatory Visit: Payer: Self-pay | Admitting: Family Medicine

## 2020-01-24 DIAGNOSIS — E1159 Type 2 diabetes mellitus with other circulatory complications: Secondary | ICD-10-CM

## 2020-01-24 DIAGNOSIS — I152 Hypertension secondary to endocrine disorders: Secondary | ICD-10-CM

## 2020-01-31 ENCOUNTER — Ambulatory Visit (HOSPITAL_COMMUNITY)
Admission: RE | Admit: 2020-01-31 | Discharge: 2020-01-31 | Disposition: A | Discharge status: Home / Self Care | Payer: Medicare Other | Source: Ambulatory Visit | Attending: Nephrology | Admitting: Nephrology

## 2020-01-31 ENCOUNTER — Other Ambulatory Visit: Payer: Self-pay

## 2020-01-31 VITALS — BP 161/41 | HR 55 | Temp 97.4°F

## 2020-01-31 DIAGNOSIS — N184 Chronic kidney disease, stage 4 (severe): Secondary | ICD-10-CM | POA: Insufficient documentation

## 2020-01-31 LAB — RENAL FUNCTION PANEL
Albumin: 3 g/dL — ABNORMAL LOW (ref 3.5–5.0)
Anion gap: 11 (ref 5–15)
BUN: 54 mg/dL — ABNORMAL HIGH (ref 8–23)
CO2: 19 mmol/L — ABNORMAL LOW (ref 22–32)
Calcium: 8.6 mg/dL — ABNORMAL LOW (ref 8.9–10.3)
Chloride: 110 mmol/L (ref 98–111)
Creatinine, Ser: 2.98 mg/dL — ABNORMAL HIGH (ref 0.61–1.24)
GFR calc Af Amer: 22 mL/min — ABNORMAL LOW (ref >60)
GFR calc non Af Amer: 19 mL/min — ABNORMAL LOW (ref >60)
Glucose, Bld: 136 mg/dL — ABNORMAL HIGH (ref 70–99)
Phosphorus: 4.7 mg/dL — ABNORMAL HIGH (ref 2.5–4.6)
Potassium: 3.8 mmol/L (ref 3.5–5.1)
Sodium: 140 mmol/L (ref 135–145)

## 2020-01-31 LAB — POCT HEMOGLOBIN-HEMACUE: Hemoglobin: 8.3 g/dL — ABNORMAL LOW (ref 13.0–17.0)

## 2020-01-31 MED ORDER — DARBEPOETIN ALFA 40 MCG/0.4ML IJ SOSY
PREFILLED_SYRINGE | INTRAMUSCULAR | Status: AC
  Filled 2020-01-31: qty 0.4

## 2020-01-31 MED ORDER — DARBEPOETIN ALFA 40 MCG/0.4ML IJ SOSY
40.0000 ug | PREFILLED_SYRINGE | INTRAMUSCULAR | Status: DC
  Administered 2020-01-31: 40 ug via SUBCUTANEOUS

## 2020-02-02 ENCOUNTER — Other Ambulatory Visit: Payer: Self-pay | Admitting: Internal Medicine

## 2020-02-04 ENCOUNTER — Other Ambulatory Visit: Payer: Medicare Other | Admitting: Orthotics

## 2020-02-11 ENCOUNTER — Other Ambulatory Visit: Payer: Self-pay | Admitting: Endocrinology

## 2020-02-11 DIAGNOSIS — I129 Hypertensive chronic kidney disease with stage 1 through stage 4 chronic kidney disease, or unspecified chronic kidney disease: Secondary | ICD-10-CM | POA: Diagnosis not present

## 2020-02-11 DIAGNOSIS — N184 Chronic kidney disease, stage 4 (severe): Secondary | ICD-10-CM | POA: Diagnosis not present

## 2020-02-11 DIAGNOSIS — Z23 Encounter for immunization: Secondary | ICD-10-CM | POA: Diagnosis not present

## 2020-02-11 DIAGNOSIS — E1122 Type 2 diabetes mellitus with diabetic chronic kidney disease: Secondary | ICD-10-CM | POA: Diagnosis not present

## 2020-02-11 DIAGNOSIS — D509 Iron deficiency anemia, unspecified: Secondary | ICD-10-CM | POA: Diagnosis not present

## 2020-02-11 DIAGNOSIS — N2581 Secondary hyperparathyroidism of renal origin: Secondary | ICD-10-CM | POA: Diagnosis not present

## 2020-02-12 ENCOUNTER — Other Ambulatory Visit: Payer: Self-pay | Admitting: Family Medicine

## 2020-02-17 ENCOUNTER — Other Ambulatory Visit: Payer: Self-pay | Admitting: Internal Medicine

## 2020-02-18 ENCOUNTER — Other Ambulatory Visit (HOSPITAL_COMMUNITY): Payer: Self-pay | Admitting: *Deleted

## 2020-02-19 ENCOUNTER — Encounter (HOSPITAL_COMMUNITY)
Admission: RE | Admit: 2020-02-19 | Discharge: 2020-02-19 | Disposition: A | Discharge status: Home / Self Care | Payer: Medicare Other | Source: Ambulatory Visit | Attending: Nephrology | Admitting: Nephrology

## 2020-02-19 VITALS — BP 162/43 | HR 59 | Temp 97.3°F | Resp 20

## 2020-02-19 DIAGNOSIS — N184 Chronic kidney disease, stage 4 (severe): Secondary | ICD-10-CM | POA: Insufficient documentation

## 2020-02-19 LAB — POCT HEMOGLOBIN-HEMACUE: Hemoglobin: 8.6 g/dL — ABNORMAL LOW (ref 13.0–17.0)

## 2020-02-19 MED ORDER — DARBEPOETIN ALFA 40 MCG/0.4ML IJ SOSY
PREFILLED_SYRINGE | INTRAMUSCULAR | Status: AC
  Filled 2020-02-19: qty 0.4

## 2020-02-19 MED ORDER — DARBEPOETIN ALFA 60 MCG/0.3ML IJ SOSY
PREFILLED_SYRINGE | INTRAMUSCULAR | Status: AC
  Filled 2020-02-19: qty 0.3

## 2020-02-19 MED ORDER — DARBEPOETIN ALFA 60 MCG/0.3ML IJ SOSY
60.0000 ug | PREFILLED_SYRINGE | INTRAMUSCULAR | Status: DC
  Administered 2020-02-19: 60 ug via SUBCUTANEOUS

## 2020-02-19 MED ORDER — SODIUM CHLORIDE 0.9 % IV SOLN
510.0000 mg | INTRAVENOUS | Status: DC
  Administered 2020-02-19: 510 mg via INTRAVENOUS
  Filled 2020-02-19: qty 17

## 2020-02-19 NOTE — Discharge Instructions (Signed)
Darbepoetin Alfa injection What is this medicine? DARBEPOETIN ALFA (dar be POE e tin AL fa) helps your body make more red blood cells. It is used to treat anemia caused by chronic kidney failure and chemotherapy. This medicine may be used for other purposes; ask your health care provider or pharmacist if you have questions. COMMON BRAND NAME(S): Aranesp What should I tell my health care provider before I take this medicine? They need to know if you have any of these conditions:  blood clotting disorders or history of blood clots  cancer patient not on chemotherapy  cystic fibrosis  heart disease, such as angina, heart failure, or a history of a heart attack  hemoglobin level of 12 g/dL or greater  high blood pressure  low levels of folate, iron, or vitamin B12  seizures  an unusual or allergic reaction to darbepoetin, erythropoietin, albumin, hamster proteins, latex, other medicines, foods, dyes, or preservatives  pregnant or trying to get pregnant  breast-feeding How should I use this medicine? This medicine is for injection into a vein or under the skin. It is usually given by a health care professional in a hospital or clinic setting. If you get this medicine at home, you will be taught how to prepare and give this medicine. Use exactly as directed. Take your medicine at regular intervals. Do not take your medicine more often than directed. It is important that you put your used needles and syringes in a special sharps container. Do not put them in a trash can. If you do not have a sharps container, call your pharmacist or healthcare provider to get one. A special MedGuide will be given to you by the pharmacist with each prescription and refill. Be sure to read this information carefully each time. Talk to your pediatrician regarding the use of this medicine in children. While this medicine may be used in children as young as 61 month of age for selected conditions,  precautions do apply. Overdosage: If you think you have taken too much of this medicine contact a poison control center or emergency room at once. NOTE: This medicine is only for you. Do not share this medicine with others. What if I miss a dose? If you miss a dose, take it as soon as you can. If it is almost time for your next dose, take only that dose. Do not take double or extra doses. What may interact with this medicine? Do not take this medicine with any of the following medications:  epoetin alfa This list may not describe all possible interactions. Give your health care provider a list of all the medicines, herbs, non-prescription drugs, or dietary supplements you use. Also tell them if you smoke, drink alcohol, or use illegal drugs. Some items may interact with your medicine. What should I watch for while using this medicine? Your condition will be monitored carefully while you are receiving this medicine. You may need blood work done while you are taking this medicine. This medicine may cause a decrease in vitamin B6. You should make sure that you get enough vitamin B6 while you are taking this medicine. Discuss the foods you eat and the vitamins you take with your health care professional. What side effects may I notice from receiving this medicine? Side effects that you should report to your doctor or health care professional as soon as possible:  allergic reactions like skin rash, itching or hives, swelling of the face, lips, or tongue  breathing problems  changes  in vision  chest pain  confusion, trouble speaking or understanding  feeling faint or lightheaded, falls  high blood pressure  muscle aches or pains  pain, swelling, warmth in the leg  rapid weight gain  severe headaches  sudden numbness or weakness of the face, arm or leg  trouble walking, dizziness, loss of balance or coordination  seizures (convulsions)  swelling of the ankles, feet,  hands  unusually weak or tired Side effects that usually do not require medical attention (report to your doctor or health care professional if they continue or are bothersome):  diarrhea  fever, chills (flu-like symptoms)  headaches  nausea, vomiting  redness, stinging, or swelling at site where injected This list may not describe all possible side effects. Call your doctor for medical advice about side effects. You may report side effects to FDA at 1-800-FDA-1088. Where should I keep my medicine? Keep out of the reach of children. Store in a refrigerator between 2 and 8 degrees C (36 and 46 degrees F). Do not freeze. Do not shake. Throw away any unused portion if using a single-dose vial. Throw away any unused medicine after the expiration date. NOTE: This sheet is a summary. It may not cover all possible information. If you have questions about this medicine, talk to your doctor, pharmacist, or health care provider.  2020 Elsevier/Gold Standard (2017-05-17 16:44:20)  Ferumoxytol injection What is this medicine? FERUMOXYTOL is an iron complex. Iron is used to make healthy red blood cells, which carry oxygen and nutrients throughout the body. This medicine is used to treat iron deficiency anemia. This medicine may be used for other purposes; ask your health care provider or pharmacist if you have questions. COMMON BRAND NAME(S): Feraheme What should I tell my health care provider before I take this medicine? They need to know if you have any of these conditions:  anemia not caused by low iron levels  high levels of iron in the blood  magnetic resonance imaging (MRI) test scheduled  an unusual or allergic reaction to iron, other medicines, foods, dyes, or preservatives  pregnant or trying to get pregnant  breast-feeding How should I use this medicine? This medicine is for injection into a vein. It is given by a health care professional in a hospital or clinic setting. Talk  to your pediatrician regarding the use of this medicine in children. Special care may be needed. Overdosage: If you think you have taken too much of this medicine contact a poison control center or emergency room at once. NOTE: This medicine is only for you. Do not share this medicine with others. What if I miss a dose? It is important not to miss your dose. Call your doctor or health care professional if you are unable to keep an appointment. What may interact with this medicine? This medicine may interact with the following medications:  other iron products This list may not describe all possible interactions. Give your health care provider a list of all the medicines, herbs, non-prescription drugs, or dietary supplements you use. Also tell them if you smoke, drink alcohol, or use illegal drugs. Some items may interact with your medicine. What should I watch for while using this medicine? Visit your doctor or healthcare professional regularly. Tell your doctor or healthcare professional if your symptoms do not start to get better or if they get worse. You may need blood work done while you are taking this medicine. You may need to follow a special diet. Talk to your  doctor. Foods that contain iron include: whole grains/cereals, dried fruits, beans, or peas, leafy green vegetables, and organ meats (liver, kidney). What side effects may I notice from receiving this medicine? Side effects that you should report to your doctor or health care professional as soon as possible:  allergic reactions like skin rash, itching or hives, swelling of the face, lips, or tongue  breathing problems  changes in blood pressure  feeling faint or lightheaded, falls  fever or chills  flushing, sweating, or hot feelings  swelling of the ankles or feet Side effects that usually do not require medical attention (report to your doctor or health care professional if they continue or are  bothersome):  diarrhea  headache  nausea, vomiting  stomach pain This list may not describe all possible side effects. Call your doctor for medical advice about side effects. You may report side effects to FDA at 1-800-FDA-1088. Where should I keep my medicine? This drug is given in a hospital or clinic and will not be stored at home. NOTE: This sheet is a summary. It may not cover all possible information. If you have questions about this medicine, talk to your doctor, pharmacist, or health care provider.  2020 Elsevier/Gold Standard (2016-06-20 20:21:10)

## 2020-02-20 ENCOUNTER — Other Ambulatory Visit: Payer: Self-pay

## 2020-02-20 ENCOUNTER — Ambulatory Visit: Payer: Medicare Other | Admitting: Orthotics

## 2020-02-20 DIAGNOSIS — B351 Tinea unguium: Secondary | ICD-10-CM

## 2020-02-20 DIAGNOSIS — L84 Corns and callosities: Secondary | ICD-10-CM

## 2020-02-20 DIAGNOSIS — Z89421 Acquired absence of other right toe(s): Secondary | ICD-10-CM

## 2020-02-20 DIAGNOSIS — E11621 Type 2 diabetes mellitus with foot ulcer: Secondary | ICD-10-CM

## 2020-02-20 DIAGNOSIS — Z899 Acquired absence of limb, unspecified: Secondary | ICD-10-CM

## 2020-02-20 DIAGNOSIS — Z89411 Acquired absence of right great toe: Secondary | ICD-10-CM

## 2020-02-20 NOTE — Progress Notes (Signed)

## 2020-02-22 DIAGNOSIS — Z23 Encounter for immunization: Secondary | ICD-10-CM | POA: Diagnosis not present

## 2020-02-24 DIAGNOSIS — E113312 Type 2 diabetes mellitus with moderate nonproliferative diabetic retinopathy with macular edema, left eye: Secondary | ICD-10-CM | POA: Diagnosis not present

## 2020-02-24 DIAGNOSIS — H35373 Puckering of macula, bilateral: Secondary | ICD-10-CM | POA: Diagnosis not present

## 2020-02-24 DIAGNOSIS — E113313 Type 2 diabetes mellitus with moderate nonproliferative diabetic retinopathy with macular edema, bilateral: Secondary | ICD-10-CM | POA: Diagnosis not present

## 2020-02-26 ENCOUNTER — Other Ambulatory Visit: Payer: Self-pay

## 2020-02-26 ENCOUNTER — Encounter (HOSPITAL_COMMUNITY)
Admission: RE | Admit: 2020-02-26 | Discharge: 2020-02-26 | Disposition: A | Discharge status: Home / Self Care | Payer: Medicare Other | Source: Ambulatory Visit | Attending: Nephrology | Admitting: Nephrology

## 2020-02-26 DIAGNOSIS — N184 Chronic kidney disease, stage 4 (severe): Secondary | ICD-10-CM | POA: Diagnosis not present

## 2020-02-26 MED ORDER — SODIUM CHLORIDE 0.9 % IV SOLN
510.0000 mg | INTRAVENOUS | Status: DC
  Administered 2020-02-26: 510 mg via INTRAVENOUS
  Filled 2020-02-26: qty 510

## 2020-02-28 ENCOUNTER — Encounter (HOSPITAL_COMMUNITY): Payer: Medicare Other

## 2020-03-02 ENCOUNTER — Other Ambulatory Visit: Payer: Self-pay | Admitting: Endocrinology

## 2020-03-02 DIAGNOSIS — E1165 Type 2 diabetes mellitus with hyperglycemia: Secondary | ICD-10-CM

## 2020-03-02 DIAGNOSIS — Z794 Long term (current) use of insulin: Secondary | ICD-10-CM

## 2020-03-09 ENCOUNTER — Telehealth: Payer: Self-pay | Admitting: Family Medicine

## 2020-03-09 ENCOUNTER — Other Ambulatory Visit: Payer: Self-pay | Admitting: *Deleted

## 2020-03-09 DIAGNOSIS — N184 Chronic kidney disease, stage 4 (severe): Secondary | ICD-10-CM

## 2020-03-09 NOTE — Telephone Encounter (Signed)
Left knee- would you like me to approved monovisc or orthovisc for patient

## 2020-03-09 NOTE — Telephone Encounter (Signed)
Patient has decided that he would like to go ahead and start the gel injections. Can this be run for approval?  I told the patient that we would contact them to schedule once approved.

## 2020-03-10 NOTE — Telephone Encounter (Signed)
Orthovisc

## 2020-03-10 NOTE — Telephone Encounter (Signed)
I have ran the orthovisc and waiting for approval

## 2020-03-13 ENCOUNTER — Encounter: Payer: Self-pay | Admitting: Family Medicine

## 2020-03-13 ENCOUNTER — Telehealth (INDEPENDENT_AMBULATORY_CARE_PROVIDER_SITE_OTHER): Payer: Medicare Other | Admitting: Family Medicine

## 2020-03-13 VITALS — Ht 76.0 in | Wt 274.0 lb

## 2020-03-13 DIAGNOSIS — N184 Chronic kidney disease, stage 4 (severe): Secondary | ICD-10-CM

## 2020-03-13 DIAGNOSIS — M1990 Unspecified osteoarthritis, unspecified site: Secondary | ICD-10-CM | POA: Insufficient documentation

## 2020-03-13 MED ORDER — TRAMADOL HCL 50 MG PO TABS: 25.0000 mg | ORAL_TABLET | Freq: Two times a day (BID) | ORAL | 0 refills | Status: DC | PRN

## 2020-03-13 NOTE — Progress Notes (Signed)
   David [name] is a 79 y.o. male who presents today for a virtual office visit.  Assessment/Plan:  Chronic Problems Addressed Today: Osteoarthritis Follows with sports medicine.  Symptoms are not well controlled.  Limited options at this point given CKD.  He is taking Tylenol.  Recommended continue with ice and compression.  Will start low-dose tramadol.  CKD (chronic kidney disease) stage 4, GFR 15-29 ml/min (HCC) Following with nephrology.  Is undergoing preparation to start dialysis.     Subjective:  HPI:  Pt here for virtual visit with wife. Will be starting dialysis soon. Has been following with nephrology and will be meeting with vascular surgery soon.    He has been following with sports medicine for bilateral knee arthritis.  He has received a few cortisone shots.  He has been undergoing prior authorization for viscosupplementation.  Has not had anything back yet.  Pain has been more unbearable in the left knee.  He has not able to take NSAIDs due to above kidney issues.  Has tried Tylenol and ice with no significant improvement. Will be starting on dialysis.         Objective/Observations  Physical Exam: Gen: NAD, resting comfortably Pulm: Normal work of breathing Neuro: Grossly normal, moves all extremities Psych: Normal affect and thought content  Virtual Visit via Video   I connected with David [name] on 03/13/20 at  3:40 PM EDT by a video enabled telemedicine application and verified that I am speaking with the correct person using two identifiers. The limitations of evaluation and management by telemedicine and the availability of in person appointments were discussed. The patient expressed understanding and agreed to proceed.   Patient location: Home Provider location: [place] participating in the virtual visit: Myself and Patient     David [name]. [name], MD 03/13/2020 4:17 PM

## 2020-03-13 NOTE — Assessment & Plan Note (Signed)
Following with nephrology.  Is undergoing preparation to start dialysis.

## 2020-03-13 NOTE — Assessment & Plan Note (Signed)
Follows with sports medicine.  Symptoms are not well controlled.  Limited options at this point given CKD.  He is taking Tylenol.  Recommended continue with ice and compression.  Will start low-dose tramadol.

## 2020-03-16 ENCOUNTER — Other Ambulatory Visit: Payer: Self-pay | Admitting: *Deleted

## 2020-03-16 ENCOUNTER — Encounter: Payer: Self-pay | Admitting: *Deleted

## 2020-03-16 NOTE — Patient Instructions (Signed)
Goals Addressed              This Visit's Progress   .  [facility]) Eat Healthy        Follow Up Date 10/13/2020   - fill half of plate with vegetables - limit fast food meals to no more than 1 per week - manage portion size - set a realistic goal    Why is this important?   When you are ready to manage your nutrition or weight, having a plan and setting goals will help.  Taking small steps to change how you eat and exercise is a good place to start.    Notes:     .  [facility]) Learn More About My Health        Follow Up Date 07/13/20   - tell my story and reason for my visit - make a list of questions - ask questions - repeat what I heard to make sure I understand - bring a list of my medicines to the visit - speak up when I don't understand    Why is this important?   The best way to learn about your health and care is by talking to the doctor and nurse.  They will answer your questions and give you information in the way that you like best.    Notes:     .  [facility]) Make and Keep All Appointments        Follow Up Date 10/13/20   - ask family or friend for a ride - call to cancel if needed - keep a calendar with appointment dates    Why is this important?   Part of staying healthy is seeing the doctor for follow-up care.  If you forget your appointments, there are some things you can do to stay on track.    Notes:     .  [facility]) Monitor and Manage My Blood Sugar        Follow Up Date 10/13/20   - check blood sugar at prescribed times - check blood sugar if I feel it is too high or too low - enter blood sugar readings and medication or insulin into daily log - take the blood sugar log to all doctor visits - take the blood sugar meter to all doctor visits -wife to assist patient to remember to take meal coverage Novolog with all meals and snacks -wife to assist reminding patient to check post prandil blood sugars and to keep log to show provider    Why is this important?     Checking your blood sugar at home helps to keep it from getting very high or very low.  Writing the results in a diary or log helps the doctor know how to care for you.  Your blood sugar log should have the time, date and the results.  Also, write down the amount of insulin or other medicine that you take.  Other information, like what you ate, exercise done and how you were feeling, will also be helpful.     Notes:     .  COMPLETED: [facility]) Patient Stated (pt-stated)        CARE PLAN ENTRY (see longtitudinal plan of care for additional care plan information)  Objective:  Lab Results  Component Value Date   HGBA1C 6.8 (H) 09/19/2019 .   Lab Results  Component Value Date   CREATININE 2.80 (H) 12/06/2019   CREATININE 2.67 (H) 11/08/2019   CREATININE 2.62 (H)  10/11/2019 .   Marland Kitchen No results found for: EGFR  Current Barriers:  Marland Kitchen Knowledge Deficits related to basic Diabetes pathophysiology and self care/management  Case Manager Clinical Goal(s):  Over the next 90 days, patient will demonstrate improved adherence to prescribed treatment plan for diabetes self care/management as evidenced by: maintaining Hgb A1C of 7 or below . Verbalize daily monitoring and recording of CBG within 90 days . Verbalize adherence to ADA/ carb modified diet within the next 90 days . Verbalize adherence to prescribed medication regimen within the next 90 days   Interventions:  . Provided education to patient about basic DM disease process . Reviewed medications with patient and discussed importance of medication adherence . Discussed plans with patient for ongoing care management follow up and provided patient with direct contact information for care management team . Provided patient with written educational materials related to hypo and hyperglycemia and importance of correct treatment . Reviewed scheduled/upcoming provider appointments including: upcoming Endocrinology appointment in August  Patient Self  Care Activities:  . Self administers insulin as prescribed . Self administers injectable DM medication (Victoza) as prescribed . Attends all scheduled provider appointments . Checks blood sugars as prescribed and utilize hyper and hypoglycemia protocol as needed . Adheres to prescribed ADA/carb modified  Initial goal documentation   Resolved due to duplicate goals    .  [facility]) Set My Target A1C        Follow Up Date 10/13/20   - set target A1C; goal 7, current 6.9    Why is this important?   Your target A1C is decided together by you and your doctor.  It is based on several things like your age and other health issues.    Notes:

## 2020-03-16 NOTE — Patient Outreach (Signed)
[facility]) Care Management  [name]  03/16/2020   David [name] [birth year] [id number]   RN Health CoachQuarterlyOutreach  Referral Date: 04/15/2019 Referral Source: Transfer from [facility] Reason for Referral: Continued Disease Management Education Insurance:Medicare   Outreach Attempt:  Successful telephone outreach to patient's wife for follow up.  HIPAA verified with wife, David [name].  Wife reports patient is doing fair and is going to have to start dialysis soon.  States he has upcoming appointment with vascular for hemodialysis access placement.  Latest Hgb A1C was 6.9 on 12/25/2019.  Wife unsure of this mornings fasting blood sugar but states his recent fasting have ranged in the 140's.  Wife reports she has to remind patient to check blood sugars and then to take his medications throughout the day.  Discussed taking meal coverage with all meals/snacks and wife reports patient eats afternoon snack usually while she is napping or not around.  Discussed provider wanting some post eating blood sugars and encouraged wife to remind patient to check blood sugars post meal and insulin coverage.  Discussed importance and reasoning for this with wife, and she stated her understanding and she would try to get patient to do so.  Encounter Medications:  Outpatient Encounter Medications as of 03/16/2020  Medication Sig Note  . acetaminophen (TYLENOL) 325 MG tablet Take 1-2 tablets (325-650 mg total) by mouth every 4 (four) hours as needed for mild pain.   Marland Kitchen amLODipine (NORVASC) 5 MG tablet TAKE 1 TABLET BY MOUTH EVERY DAY   . atorvastatin (LIPITOR) 10 MG tablet TAKE 1 TABLET BY MOUTH EVERY DAY AT 6PM   . calcitRIOL (ROCALTROL) 0.25 MCG capsule Take 1 capsule by mouth once daily   . carvedilol (COREG) 25 MG tablet TAKE 1 TABLET BY MOUTH TWICE DAILY WITH A MEAL   . cholecalciferol (VITAMIN D) 1000 units tablet Take 1 tablet (1,000 Units total) by mouth  daily.   . cholestyramine light (PREVALITE) 4 g packet Take 4 g by mouth daily.   . clopidogrel (PLAVIX) 75 MG tablet TAKE 1 TABLET BY MOUTH EVERY DAY   . folic acid (FOLVITE) 1 MG tablet Take 1 mg by mouth daily.   . hydrALAZINE (APRESOLINE) 100 MG tablet TAKE 1 TABLET BY MOUTH TWICE A DAY   . insulin aspart (NOVOLOG FLEXPEN) 100 UNIT/ML FlexPen INJECT 10 UNITS SUBCUTANEOUSLY BEFORE THE FIRST MEAL OF THE DAY AND 12 UNITS AT DINNER   . isosorbide mononitrate (IMDUR) 30 MG 24 hr tablet TAKE 2 TABLETS DAILY. PLEASE MAKE OVERDUE APPT WITH DR. Caryl Comes BEFORE ANYMORE REFILLS. 1ST ATTEMPT   . Omega-3 Fatty Acids (FISH OIL) 1000 MG CAPS Take 1 capsule (1,000 mg total) by mouth every morning.   . pantoprazole (PROTONIX) 40 MG tablet TAKE 1 TABLET BY MOUTH EVERY DAY   . torsemide (DEMADEX) 20 MG tablet TAKE 2 TABS EVERY OTHER DAY. PLEASE MAKE OVERDUE APPT WITH DR. Caryl Comes BEFORE ANYMORE REFILLS.   Marland Kitchen traMADol (ULTRAM) 50 MG tablet Take 0.5-1 tablets (25-50 mg total) by mouth every 12 (twelve) hours as needed.   . TRESIBA FLEXTOUCH 200 UNIT/ML FlexTouch Pen INJECT 44 UNITS SUBCUTANEOUSLY ONCE DAILY   . VICTOZA 18 MG/3ML SOPN INJECT 0.3ML (1.8MG TOTAL) INTO THE SKIN DAILY BEFORE SUPPER   . vitamin B-12 (CYANOCOBALAMIN) 100 MCG tablet Take 100 mcg by mouth daily.   . blood glucose meter kit and supplies KIT Dispense based on patient and insurance preference. Use up to four times daily as  directed. (FOR ICD-9 250.00, 250.01).   Marland Kitchen glucose blood test strip Use Contour test strips as instructed to check blood sugar four times daily.   . Insulin Syringe-Needle U-100 (INSULIN SYRINGE 1CC/31GX5/16") 31G X 5/16" 1 ML MISC 1 each by Does not apply route 3 (three) times daily. Use insulin syringe to inject insulin three times daily.   . sertraline (ZOLOFT) 100 MG tablet Take 1 tablet (100 mg total) by mouth daily. 12/19/2019: Continues to take   No facility-administered encounter medications on file as of 03/16/2020.     Functional Status:  In your present state of health, do you have any difficulty performing the following activities: 07/11/2019 05/13/2019  Hearing? N Y  Comment - bilateral hearing aids  Vision? N Y  Comment - retinopathy treatments  Difficulty concentrating or making decisions? Y Y  Comment forgetfullness at times trouble remembering, mild dementia  Walking or climbing stairs? Y Y  Comment - frequent falls, trouble climbing stairs  Dressing or bathing? N Y  Comment - wife assist with bathing and dressing  Doing errands, shopping? N Y  Comment - wife and family Land and eating ? N Y  Comment - wife and family cooks  Using the Toilet? N Y  Comment - wife assist to bathroom  In the past six months, have you accidently leaked urine? N Y  Comment - urinary incontinence  Do you have problems with loss of bowel control? N Y  Comment - trouble controlling bowels  Managing your Medications? N Y  Comment - wife manages medications  Managing your Finances? N Y  Comment - wife and family pays bills  Housekeeping or managing your Housekeeping? N Y  Comment - wife and family cleans  Some recent data might be hidden    Fall/Depression Screening: Fall Risk  03/16/2020 12/19/2019 09/09/2019  Falls in the past year? '1 1 1  ' Comment - - -  Number falls in past yr: '1 1 1  ' Comment last fall december 2020 last fall December 2020 Last fall December 2020  Injury with Fall? 0 0 0  Comment - - -  Risk for fall due to : History of fall(s);Impaired balance/gait;Impaired mobility;Impaired vision;Medication side effect History of fall(s);Impaired balance/gait;Impaired mobility;Impaired vision;Medication side effect History of fall(s);Impaired balance/gait;Impaired mobility;Impaired vision;Medication side effect  Risk for fall due to: Comment - - -  Follow up Falls evaluation completed;Education provided;Falls prevention discussed Falls evaluation completed;Education provided;Falls  prevention discussed Falls evaluation completed;Education provided;Falls prevention discussed   PHQ 2/9 Scores 07/11/2019 05/13/2019 04/03/2019 01/18/2019 01/14/2019 11/19/2018 09/25/2017  PHQ - 2 Score 0 - 0 1 1 0 0  PHQ- 9 Score - - - - - - 1  Exception Documentation - Other- indicate reason in comment box - - Other- indicate reason in comment box - -  Not completed - did not speak with patient, spoke with wife - - Spouse states patrient is being worked up for dementia and has memory issues - -    Goals Addressed              This Visit's Progress   .  [facility]) Eat Healthy        Follow Up Date 10/13/2020   - fill half of plate with vegetables - limit fast food meals to no more than 1 per week - manage portion size - set a realistic goal    Why is this important?   When you are ready to manage your nutrition  or weight, having a plan and setting goals will help.  Taking small steps to change how you eat and exercise is a good place to start.    Notes:     .  [facility]) Learn More About My Health        Follow Up Date 07/13/20   - tell my story and reason for my visit - make a list of questions - ask questions - repeat what I heard to make sure I understand - bring a list of my medicines to the visit - speak up when I don't understand    Why is this important?   The best way to learn about your health and care is by talking to the doctor and nurse.  They will answer your questions and give you information in the way that you like best.    Notes:     .  [facility]) Make and Keep All Appointments        Follow Up Date 10/13/20   - ask family or friend for a ride - call to cancel if needed - keep a calendar with appointment dates    Why is this important?   Part of staying healthy is seeing the doctor for follow-up care.  If you forget your appointments, there are some things you can do to stay on track.    Notes:     .  [facility]) Monitor and Manage My Blood Sugar        Follow Up Date  10/13/20   - check blood sugar at prescribed times - check blood sugar if I feel it is too high or too low - enter blood sugar readings and medication or insulin into daily log - take the blood sugar log to all doctor visits - take the blood sugar meter to all doctor visits -wife to assist patient to remember to take meal coverage Novolog with all meals and snacks -wife to assist reminding patient to check post prandil blood sugars and to keep log to show provider    Why is this important?   Checking your blood sugar at home helps to keep it from getting very high or very low.  Writing the results in a diary or log helps the doctor know how to care for you.  Your blood sugar log should have the time, date and the results.  Also, write down the amount of insulin or other medicine that you take.  Other information, like what you ate, exercise done and how you were feeling, will also be helpful.     Notes:     .  COMPLETED: [facility]) Patient Stated (pt-stated)        CARE PLAN ENTRY (see longtitudinal plan of care for additional care plan information)  Objective:  Lab Results  Component Value Date   HGBA1C 6.8 (H) 09/19/2019 .   Lab Results  Component Value Date   CREATININE 2.80 (H) 12/06/2019   CREATININE 2.67 (H) 11/08/2019   CREATININE 2.62 (H) 10/11/2019 .   Marland Kitchen No results found for: EGFR  Current Barriers:  Marland Kitchen Knowledge Deficits related to basic Diabetes pathophysiology and self care/management  Case Manager Clinical Goal(s):  Over the next 90 days, patient will demonstrate improved adherence to prescribed treatment plan for diabetes self care/management as evidenced by: maintaining Hgb A1C of 7 or below . Verbalize daily monitoring and recording of CBG within 90 days . Verbalize adherence to ADA/ carb modified diet within the next 90 days .  Verbalize adherence to prescribed medication regimen within the next 90 days   Interventions:  . Provided education to patient about  basic DM disease process . Reviewed medications with patient and discussed importance of medication adherence . Discussed plans with patient for ongoing care management follow up and provided patient with direct contact information for care management team . Provided patient with written educational materials related to hypo and hyperglycemia and importance of correct treatment . Reviewed scheduled/upcoming provider appointments including: upcoming Endocrinology appointment in August  Patient Self Care Activities:  . Self administers insulin as prescribed . Self administers injectable DM medication (Victoza) as prescribed . Attends all scheduled provider appointments . Checks blood sugars as prescribed and utilize hyper and hypoglycemia protocol as needed . Adheres to prescribed ADA/carb modified  Initial goal documentation   Resolved due to duplicate goals    .  [facility]) Set My Target A1C        Follow Up Date 10/13/20   - set target A1C; goal 7, current 6.9    Why is this important?   Your target A1C is decided together by you and your doctor.  It is based on several things like your age and other health issues.    Notes:       Appointments:  Attended appointment with primary care provider on 03/13/2020.  Attended appointment with Endocrinologist, Dr. Dwyane Dee on 12/31/2019 and has follow up on 04/02/2020.  Plan: [name] will send primary care provider quarterly update. [name] will make next telephone outreach to family within the month of February and wife agrees to future outreach.  [place] [phone] [email or web address]

## 2020-03-18 ENCOUNTER — Encounter (HOSPITAL_COMMUNITY)
Admission: RE | Admit: 2020-03-18 | Discharge: 2020-03-18 | Disposition: A | Discharge status: Home / Self Care | Payer: Medicare Other | Source: Ambulatory Visit | Attending: Nephrology | Admitting: Nephrology

## 2020-03-18 VITALS — BP 148/52 | HR 63 | Temp 97.3°F | Resp 20

## 2020-03-18 DIAGNOSIS — N184 Chronic kidney disease, stage 4 (severe): Secondary | ICD-10-CM | POA: Insufficient documentation

## 2020-03-18 LAB — RENAL FUNCTION PANEL
Albumin: 3.1 g/dL — ABNORMAL LOW (ref 3.5–5.0)
Anion gap: 11 (ref 5–15)
BUN: 58 mg/dL — ABNORMAL HIGH (ref 8–23)
CO2: 22 mmol/L (ref 22–32)
Calcium: 9.3 mg/dL (ref 8.9–10.3)
Chloride: 108 mmol/L (ref 98–111)
Creatinine, Ser: 2.69 mg/dL — ABNORMAL HIGH (ref 0.61–1.24)
GFR, Estimated: 23 mL/min — ABNORMAL LOW (ref >60)
Glucose, Bld: 180 mg/dL — ABNORMAL HIGH (ref 70–99)
Phosphorus: 3.9 mg/dL (ref 2.5–4.6)
Potassium: 4 mmol/L (ref 3.5–5.1)
Sodium: 141 mmol/L (ref 135–145)

## 2020-03-18 LAB — FERRITIN: Ferritin: 469 ng/mL — ABNORMAL HIGH (ref 24–336)

## 2020-03-18 LAB — POCT HEMOGLOBIN-HEMACUE: Hemoglobin: 9.4 g/dL — ABNORMAL LOW (ref 13.0–17.0)

## 2020-03-18 LAB — IRON AND TIBC
Iron: 48 ug/dL (ref 45–182)
Saturation Ratios: 23 % (ref 17.9–39.5)
TIBC: 206 ug/dL — ABNORMAL LOW (ref 250–450)
UIBC: 158 ug/dL

## 2020-03-18 MED ORDER — DARBEPOETIN ALFA 60 MCG/0.3ML IJ SOSY
PREFILLED_SYRINGE | INTRAMUSCULAR | Status: AC
  Administered 2020-03-18: 60 ug via SUBCUTANEOUS
  Filled 2020-03-18: qty 0.3

## 2020-03-18 MED ORDER — DARBEPOETIN ALFA 60 MCG/0.3ML IJ SOSY: 60.0000 ug | PREFILLED_SYRINGE | INTRAMUSCULAR | Status: DC

## 2020-03-18 NOTE — Telephone Encounter (Signed)
Pt wife called, looking for update on gel approval.

## 2020-03-18 NOTE — Telephone Encounter (Signed)
I had updated the information in the portal. Awaiting approval or denial will inform patient once the decision comes back

## 2020-03-19 ENCOUNTER — Ambulatory Visit: Payer: Self-pay

## 2020-03-19 ENCOUNTER — Ambulatory Visit (INDEPENDENT_AMBULATORY_CARE_PROVIDER_SITE_OTHER): Payer: Medicare Other | Admitting: Family Medicine

## 2020-03-19 ENCOUNTER — Other Ambulatory Visit: Payer: Self-pay

## 2020-03-19 DIAGNOSIS — M25562 Pain in left knee: Secondary | ICD-10-CM

## 2020-03-19 DIAGNOSIS — G8929 Other chronic pain: Secondary | ICD-10-CM | POA: Diagnosis not present

## 2020-03-19 NOTE — Telephone Encounter (Signed)
Called patients wife back and patient is scheduled for 03/19/2020

## 2020-03-19 NOTE — Progress Notes (Signed)
Erdem presents to clinic today for Orthovisc injection left knee 1/4  Procedure: Real-time Ultrasound Guided Injection of left knee superior lateral patellar space Device: Philips Affiniti 50G Images permanently stored and available for review in PACS Verbal informed consent obtained.  Discussed risks and benefits of procedure. Warned about infection bleeding damage to structures skin hypopigmentation and fat atrophy among others. Patient expresses understanding and agreement Time-out conducted.   Noted no overlying erythema, induration, or other signs of local infection.   Skin prepped in a sterile fashion.   Local anesthesia: Topical Ethyl chloride.   With sterile technique and under real time ultrasound guidance:  Orthovisc injected into joint. Fluid seen entering the capsule.   Completed without difficulty   Advised to call if fevers/chills, erythema, induration, drainage, or persistent bleeding.   Images permanently stored and available for review in the ultrasound unit.  Impression: Technically successful ultrasound guided injection.  Lot [id number]  Incidentally patient mentions that his kidney failure has worsened and is the process of getting an AV fistula placed to start dialysis.

## 2020-03-19 NOTE — Patient Instructions (Addendum)
You had a L knee Orthovisc injection (1/4) today.  Call or go to the ER if you develop a large red swollen joint with extreme pain or oozing puss.   Schedule weekly for the next 3 weeks Orthovisc injections.

## 2020-03-20 ENCOUNTER — Telehealth (INDEPENDENT_AMBULATORY_CARE_PROVIDER_SITE_OTHER): Payer: Medicare Other | Admitting: Family Medicine

## 2020-03-20 DIAGNOSIS — N184 Chronic kidney disease, stage 4 (severe): Secondary | ICD-10-CM

## 2020-03-20 DIAGNOSIS — I639 Cerebral infarction, unspecified: Secondary | ICD-10-CM

## 2020-03-20 DIAGNOSIS — R5381 Other malaise: Secondary | ICD-10-CM

## 2020-03-20 NOTE — Assessment & Plan Note (Signed)
Following with nephrology.  Will be undergoing dialysis soon.

## 2020-03-20 NOTE — Assessment & Plan Note (Addendum)
On Plavix 75 mg daily and Lipitor 10 mg daily.  Will place referral to home health.

## 2020-03-20 NOTE — Progress Notes (Signed)
   ARSHIA RONDON is a 79 y.o. male who presents today for a virtual office visit.  Assessment/Plan:  Chronic Problems Addressed Today: Debility Patient requires extra help at home with medication management and strengthening exercises.  High risk for hospitalization due to history of ESRD, obstructive cardiomyopathy, and prior stroke.  Will place referral for home health.  CKD (chronic kidney disease) stage 4, GFR 15-29 ml/min (HCC) Following with nephrology.  Will be undergoing dialysis soon.  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  Will place referral to home health.     Subjective:  HPI:  Patient here with wife and son.  Overall doing well.  They are interested in possibly getting home health.  He has had quite a bit of difficulty recently with mobility and ambulation.  Has a history of cardiomyopathy and will be undergoing dialysis soon for end-stage renal disease.  Also has a history of stroke.       Objective/Observations  Physical Exam: Gen: NAD, resting comfortably Pulm: Normal work of breathing Neuro: Grossly normal, moves all extremities Psych: Normal affect and thought content  Virtual Visit via Video   I connected with [name] on 03/20/20 at 12:40 PM EDT by a video enabled telemedicine application and verified that I am speaking with the correct person using two identifiers. The limitations of evaluation and management by telemedicine and the availability of in person appointments were discussed. The patient expressed understanding and agreed to proceed.   Patient location: Home Provider location: [place] participating in the virtual visit: Myself and Patient     [name], MD 03/20/2020 12:21 PM

## 2020-03-20 NOTE — Assessment & Plan Note (Signed)
Patient requires extra help at home with medication management and strengthening exercises.  High risk for hospitalization due to history of ESRD, obstructive cardiomyopathy, and prior stroke.  Will place referral for home health.

## 2020-03-23 DIAGNOSIS — E113313 Type 2 diabetes mellitus with moderate nonproliferative diabetic retinopathy with macular edema, bilateral: Secondary | ICD-10-CM | POA: Diagnosis not present

## 2020-03-23 DIAGNOSIS — H31091 Other chorioretinal scars, right eye: Secondary | ICD-10-CM | POA: Diagnosis not present

## 2020-03-23 DIAGNOSIS — E113312 Type 2 diabetes mellitus with moderate nonproliferative diabetic retinopathy with macular edema, left eye: Secondary | ICD-10-CM | POA: Diagnosis not present

## 2020-03-23 DIAGNOSIS — H35373 Puckering of macula, bilateral: Secondary | ICD-10-CM | POA: Diagnosis not present

## 2020-03-23 DIAGNOSIS — H43813 Vitreous degeneration, bilateral: Secondary | ICD-10-CM | POA: Diagnosis not present

## 2020-03-24 ENCOUNTER — Telehealth: Payer: Self-pay

## 2020-03-24 DIAGNOSIS — I69392 Facial weakness following cerebral infarction: Secondary | ICD-10-CM | POA: Diagnosis not present

## 2020-03-24 DIAGNOSIS — E1122 Type 2 diabetes mellitus with diabetic chronic kidney disease: Secondary | ICD-10-CM | POA: Diagnosis not present

## 2020-03-24 DIAGNOSIS — E785 Hyperlipidemia, unspecified: Secondary | ICD-10-CM | POA: Diagnosis not present

## 2020-03-24 DIAGNOSIS — Z7902 Long term (current) use of antithrombotics/antiplatelets: Secondary | ICD-10-CM | POA: Diagnosis not present

## 2020-03-24 DIAGNOSIS — E1169 Type 2 diabetes mellitus with other specified complication: Secondary | ICD-10-CM | POA: Diagnosis not present

## 2020-03-24 DIAGNOSIS — I69311 Memory deficit following cerebral infarction: Secondary | ICD-10-CM | POA: Diagnosis not present

## 2020-03-24 DIAGNOSIS — G8929 Other chronic pain: Secondary | ICD-10-CM | POA: Diagnosis not present

## 2020-03-24 DIAGNOSIS — Z9641 Presence of insulin pump (external) (internal): Secondary | ICD-10-CM | POA: Diagnosis not present

## 2020-03-24 DIAGNOSIS — I421 Obstructive hypertrophic cardiomyopathy: Secondary | ICD-10-CM | POA: Diagnosis not present

## 2020-03-24 DIAGNOSIS — Z794 Long term (current) use of insulin: Secondary | ICD-10-CM | POA: Diagnosis not present

## 2020-03-24 DIAGNOSIS — K219 Gastro-esophageal reflux disease without esophagitis: Secondary | ICD-10-CM | POA: Diagnosis not present

## 2020-03-24 DIAGNOSIS — E669 Obesity, unspecified: Secondary | ICD-10-CM | POA: Diagnosis not present

## 2020-03-24 DIAGNOSIS — E1151 Type 2 diabetes mellitus with diabetic peripheral angiopathy without gangrene: Secondary | ICD-10-CM | POA: Diagnosis not present

## 2020-03-24 DIAGNOSIS — Z6841 Body Mass Index (BMI) 40.0 and over, adult: Secondary | ICD-10-CM | POA: Diagnosis not present

## 2020-03-24 DIAGNOSIS — I4891 Unspecified atrial fibrillation: Secondary | ICD-10-CM | POA: Diagnosis not present

## 2020-03-24 DIAGNOSIS — M25562 Pain in left knee: Secondary | ICD-10-CM | POA: Diagnosis not present

## 2020-03-24 DIAGNOSIS — G4733 Obstructive sleep apnea (adult) (pediatric): Secondary | ICD-10-CM | POA: Diagnosis not present

## 2020-03-24 DIAGNOSIS — Z950 Presence of cardiac pacemaker: Secondary | ICD-10-CM | POA: Diagnosis not present

## 2020-03-24 DIAGNOSIS — N184 Chronic kidney disease, stage 4 (severe): Secondary | ICD-10-CM | POA: Diagnosis not present

## 2020-03-24 NOTE — Telephone Encounter (Signed)
Home Health Verbal Orders  Agency:  [facility]    Requesting PT   Frequency:  Twice a week for four weeks then once a week for four weeks

## 2020-03-25 NOTE — Telephone Encounter (Signed)
Called and lm on Mickel Baas vm with VO.

## 2020-03-25 NOTE — Telephone Encounter (Signed)
Please advise 

## 2020-03-25 NOTE — Telephone Encounter (Signed)
Ok with me. Please place any necessary orders. 

## 2020-03-26 ENCOUNTER — Other Ambulatory Visit: Payer: Self-pay

## 2020-03-26 ENCOUNTER — Ambulatory Visit: Payer: Self-pay

## 2020-03-26 ENCOUNTER — Ambulatory Visit (INDEPENDENT_AMBULATORY_CARE_PROVIDER_SITE_OTHER): Payer: Medicare Other | Admitting: Family Medicine

## 2020-03-26 DIAGNOSIS — G8929 Other chronic pain: Secondary | ICD-10-CM

## 2020-03-26 DIAGNOSIS — M25562 Pain in left knee: Secondary | ICD-10-CM | POA: Diagnosis not present

## 2020-03-26 DIAGNOSIS — M1712 Unilateral primary osteoarthritis, left knee: Secondary | ICD-10-CM

## 2020-03-26 NOTE — Progress Notes (Signed)
David [name] presents to clinic today for Orthovisc injection left knee 2/4  Procedure: Real-time Ultrasound Guided Injection of left knee superior patellar space Device: Philips Affiniti 50G Images permanently stored and available for review in PACS Verbal informed consent obtained.  Discussed risks and benefits of procedure. Warned about infection bleeding damage to structures skin hypopigmentation and fat atrophy among others. Patient expresses understanding and agreement Time-out conducted.   Noted no overlying erythema, induration, or other signs of local infection.   Skin prepped in a sterile fashion.   Local anesthesia: Topical Ethyl chloride.   With sterile technique and under real time ultrasound guidance:  Orthovisc injected into knee. Fluid seen entering the knee joint.   Completed without difficulty   Advised to call if fevers/chills, erythema, induration, drainage, or persistent bleeding.   Images permanently stored and available for review in the ultrasound unit.  Impression: Technically successful ultrasound guided injection.   Lot #[id number]  Return in 1 week for Orthovisc injection left knee 3/4

## 2020-03-29 DIAGNOSIS — N184 Chronic kidney disease, stage 4 (severe): Secondary | ICD-10-CM | POA: Diagnosis not present

## 2020-03-29 DIAGNOSIS — I421 Obstructive hypertrophic cardiomyopathy: Secondary | ICD-10-CM | POA: Diagnosis not present

## 2020-03-29 DIAGNOSIS — E1122 Type 2 diabetes mellitus with diabetic chronic kidney disease: Secondary | ICD-10-CM | POA: Diagnosis not present

## 2020-03-29 DIAGNOSIS — E1151 Type 2 diabetes mellitus with diabetic peripheral angiopathy without gangrene: Secondary | ICD-10-CM | POA: Diagnosis not present

## 2020-03-29 DIAGNOSIS — I69392 Facial weakness following cerebral infarction: Secondary | ICD-10-CM | POA: Diagnosis not present

## 2020-03-29 DIAGNOSIS — I69311 Memory deficit following cerebral infarction: Secondary | ICD-10-CM | POA: Diagnosis not present

## 2020-03-30 ENCOUNTER — Other Ambulatory Visit (INDEPENDENT_AMBULATORY_CARE_PROVIDER_SITE_OTHER): Payer: Medicare Other

## 2020-03-30 ENCOUNTER — Other Ambulatory Visit: Payer: Self-pay

## 2020-03-30 DIAGNOSIS — I421 Obstructive hypertrophic cardiomyopathy: Secondary | ICD-10-CM | POA: Diagnosis not present

## 2020-03-30 DIAGNOSIS — Z794 Long term (current) use of insulin: Secondary | ICD-10-CM

## 2020-03-30 DIAGNOSIS — E1151 Type 2 diabetes mellitus with diabetic peripheral angiopathy without gangrene: Secondary | ICD-10-CM | POA: Diagnosis not present

## 2020-03-30 DIAGNOSIS — N184 Chronic kidney disease, stage 4 (severe): Secondary | ICD-10-CM | POA: Diagnosis not present

## 2020-03-30 DIAGNOSIS — I69392 Facial weakness following cerebral infarction: Secondary | ICD-10-CM | POA: Diagnosis not present

## 2020-03-30 DIAGNOSIS — I69311 Memory deficit following cerebral infarction: Secondary | ICD-10-CM | POA: Diagnosis not present

## 2020-03-30 DIAGNOSIS — E1122 Type 2 diabetes mellitus with diabetic chronic kidney disease: Secondary | ICD-10-CM | POA: Diagnosis not present

## 2020-03-30 DIAGNOSIS — E1165 Type 2 diabetes mellitus with hyperglycemia: Secondary | ICD-10-CM

## 2020-03-30 LAB — BASIC METABOLIC PANEL
BUN: 62 mg/dL — ABNORMAL HIGH (ref 6–23)
CO2: 23 mEq/L (ref 19–32)
Calcium: 9.3 mg/dL (ref 8.4–10.5)
Chloride: 111 mEq/L (ref 96–112)
Creatinine, Ser: 2.75 mg/dL — ABNORMAL HIGH (ref 0.40–1.50)
GFR: 21.25 mL/min — ABNORMAL LOW (ref >60.00)
Glucose, Bld: 82 mg/dL (ref 70–99)
Potassium: 4.1 mEq/L (ref 3.5–5.1)
Sodium: 144 mEq/L (ref 135–145)

## 2020-03-30 LAB — HEMOGLOBIN A1C: Hgb A1c MFr Bld: 6.5 % (ref 4.6–6.5)

## 2020-03-31 ENCOUNTER — Telehealth: Payer: Self-pay

## 2020-03-31 DIAGNOSIS — N2581 Secondary hyperparathyroidism of renal origin: Secondary | ICD-10-CM | POA: Diagnosis not present

## 2020-03-31 DIAGNOSIS — N184 Chronic kidney disease, stage 4 (severe): Secondary | ICD-10-CM | POA: Diagnosis not present

## 2020-03-31 DIAGNOSIS — E1122 Type 2 diabetes mellitus with diabetic chronic kidney disease: Secondary | ICD-10-CM | POA: Diagnosis not present

## 2020-03-31 DIAGNOSIS — I129 Hypertensive chronic kidney disease with stage 1 through stage 4 chronic kidney disease, or unspecified chronic kidney disease: Secondary | ICD-10-CM | POA: Diagnosis not present

## 2020-03-31 DIAGNOSIS — D509 Iron deficiency anemia, unspecified: Secondary | ICD-10-CM | POA: Diagnosis not present

## 2020-03-31 NOTE — Telephone Encounter (Signed)
Please advise 

## 2020-03-31 NOTE — Telephone Encounter (Signed)
Ok with me. Please place any necessary orders. 

## 2020-03-31 NOTE — Telephone Encounter (Signed)
Davene from [facility] called and stated they did a nurse assessment upon return from hospital and the outcome of the evaluation is patient and family would benefit from dialysis education support due to patient starting dialysis shortly and observation assessement  If we have any questions davene said we can give her a call [phone]

## 2020-04-01 NOTE — Telephone Encounter (Signed)
VO given to Davene from [employer]

## 2020-04-02 ENCOUNTER — Other Ambulatory Visit: Payer: Self-pay

## 2020-04-02 ENCOUNTER — Ambulatory Visit (INDEPENDENT_AMBULATORY_CARE_PROVIDER_SITE_OTHER): Payer: Medicare Other | Admitting: Endocrinology

## 2020-04-02 ENCOUNTER — Other Ambulatory Visit: Payer: Self-pay | Admitting: Family Medicine

## 2020-04-02 ENCOUNTER — Encounter: Payer: Self-pay | Admitting: Endocrinology

## 2020-04-02 VITALS — BP 150/72 | HR 65 | Ht 76.0 in | Wt 279.0 lb

## 2020-04-02 DIAGNOSIS — I2510 Atherosclerotic heart disease of native coronary artery without angina pectoris: Secondary | ICD-10-CM | POA: Diagnosis not present

## 2020-04-02 DIAGNOSIS — E1165 Type 2 diabetes mellitus with hyperglycemia: Secondary | ICD-10-CM | POA: Diagnosis not present

## 2020-04-02 DIAGNOSIS — Z794 Long term (current) use of insulin: Secondary | ICD-10-CM | POA: Diagnosis not present

## 2020-04-02 NOTE — Patient Instructions (Addendum)
NovoLog 5 units with carbohydrate intake like when  eating a half Sandwiches  Check blood sugars on waking up 3-4 days a week  Also check blood sugars about 2 hours after meals and do this after different meals by rotation  Recommended blood sugar levels on waking up are 90-130 and about 2 hours after meal is 130-160  Please bring your blood sugar monitor to each visit, thank you

## 2020-04-02 NOTE — Progress Notes (Signed)
Patient ID: David [name], male   DOB: 12/27/1940, 79 y.o.   MRN: [id number]    Reason for Appointment: Followup for Type 2 Diabetes   History of Present Illness:          Diagnosis: Type 2 diabetes mellitus, date of diagnosis:   1992       Past history:  He was initially treated with metformin and at some point also glipizide. Apparently he was started on insulin in 1994 approximately because of poor control He has been on various insulin regimens over the last several years However even with insulin he has had poor control for at least the last 7 or 8 years. He does not know what his previous A1c levels have been. He had been continued on metformin and glipizide but metformin stopped because of kidney function abnormality He had been taking Lantus 60 units twice a day with NovoLog previously and also Before his initial consultation in 6/15 he was on NovoLog twice a day and Humalog mix insulin  Because of poor control and large insulin doses he was started on Victoza in 6/15  Since 04/28/14 he had been on a [employer] insulin pump because of persistent poor control and high insulin requirement  Recent history:   Non-insulin hypoglycemic drugs the patient is taking are: VICTOZA 1.8 mg daily  INSULIN regimen: Tresiba 44 units daily, NovoLog 10 units at brunch and 10-12 units at dinner   A1c is better at 6.5  Fructosamine : 329 last  Current management, problems and blood sugar patterns:  He has overall somewhat better blood sugars on an average  He will sporadically have high blood sugars usually in the evenings before dinner or in the afternoon based on a snack  He will sometimes even have a half sandwich in the afternoon and despite repeated instructions he does not take any insulin to cover this  Also will have sometimes high readings from sweets like donuts  Currently unable to do any exercise  His fasting readings are generally fairly well controlled  No  hypoglycemia also with lowest blood sugar 73 after lunch  Usually not checking readings after dinner to help assess his mealtime dose  Again his wife is trying to adjust his premeal dose based on the blood sugar rather than the food intake  Taking all medications including premeal insulin and Victoza regularly  Meals 12p, 7 pm and snack and afternoon  No exercise  PRE-MEAL Fasting  midday  afternoon  evening Overall  Glucose range:  91-144  85-164  73-300  86-222 73-300  Mean/median:   127  146  150 139   Previous readings:  PRE-MEAL Fasting Lunch Dinner Bedtime Overall  Glucose range:  72-206   106-185    Mean/median:  150   158   150     Self-care:   Meals: 2- 3 meals per day.   breakfast is cheerios or egg and toast.  Will have half sandwich with soup at 1 pm , dinner at  6-7 pm.     Bedtime snack is usually crackers  with milk or have been admitted sandwich          Last  consultation : Most recent: 08/2017      Wt Readings from Last 3 Encounters:  04/02/20 279 lb (126.6 kg)  03/13/20 274 lb (124.3 kg)  01/22/20 281 lb 12.8 oz (127.8 kg)   Glycemic control:   Lab Results  Component Value Date  HGBA1C 6.5 03/30/2020   HGBA1C 6.9 (H) 12/25/2019   HGBA1C 6.8 (H) 09/19/2019   Lab Results  Component Value Date   MICROALBUR 7.3 (H) 03/05/2019   LDLCALC 73 01/06/2019   CREATININE 2.75 (H) 03/30/2020            Lab Results  Component Value Date   FRUCTOSAMINE 329 (H) 05/02/2019   FRUCTOSAMINE 295 (H) 12/28/2018   FRUCTOSAMINE 316 (H) 07/17/2018   FRUCTOSAMINE 319 (H) 12/27/2017     Other active problems: See review of systems   Allergies as of 04/02/2020      Reactions   Ativan [lorazepam] Anxiety   Pt gets more agitated   Adhesive [tape] Other (See Comments)   blisters      Medication List       Accurate as of April 02, 2020  8:32 PM. If you have any questions, ask your nurse or doctor.        acetaminophen 325 MG tablet Commonly known  as: TYLENOL Take 1-2 tablets (325-650 mg total) by mouth every 4 (four) hours as needed for mild pain.   amLODipine 5 MG tablet Commonly known as: NORVASC TAKE 1 TABLET BY MOUTH EVERY DAY   atorvastatin 10 MG tablet Commonly known as: LIPITOR TAKE 1 TABLET BY MOUTH EVERY DAY AT 6PM   blood glucose meter kit and supplies Kit Dispense based on patient and insurance preference. Use up to four times daily as directed. (FOR ICD-9 250.00, 250.01).   calcitRIOL 0.25 MCG capsule Commonly known as: ROCALTROL Take 1 capsule by mouth once daily   carvedilol 25 MG tablet Commonly known as: COREG TAKE 1 TABLET BY MOUTH TWICE DAILY WITH A MEAL   cholecalciferol 1000 units tablet Commonly known as: VITAMIN D Take 1 tablet (1,000 Units total) by mouth daily.   cholestyramine light 4 g packet Commonly known as: PREVALITE Take 4 g by mouth daily.   clopidogrel 75 MG tablet Commonly known as: PLAVIX TAKE 1 TABLET BY MOUTH EVERY DAY   Fish Oil 1000 MG Caps Take 1 capsule (1,000 mg total) by mouth every morning.   folic acid 1 MG tablet Commonly known as: FOLVITE Take 1 mg by mouth daily.   glucose blood test strip Use Contour test strips as instructed to check blood sugar four times daily.   hydrALAZINE 100 MG tablet Commonly known as: APRESOLINE TAKE 1 TABLET BY MOUTH TWICE A DAY   INSULIN SYRINGE 1CC/31GX5/16" 31G X 5/16" 1 ML Misc 1 each by Does not apply route 3 (three) times daily. Use insulin syringe to inject insulin three times daily.   isosorbide mononitrate 30 MG 24 hr tablet Commonly known as: IMDUR TAKE 2 TABLETS DAILY. PLEASE MAKE OVERDUE APPT WITH DR. KLEIN BEFORE ANYMORE REFILLS. 1ST ATTEMPT   NovoLOG FlexPen 100 UNIT/ML FlexPen Generic drug: insulin aspart INJECT 10 UNITS SUBCUTANEOUSLY BEFORE THE FIRST MEAL OF THE DAY AND 12 UNITS AT DINNER   pantoprazole 40 MG tablet Commonly known as: PROTONIX TAKE 1 TABLET BY MOUTH EVERY DAY   sertraline 100 MG  tablet Commonly known as: ZOLOFT Take 1 tablet (100 mg total) by mouth daily.   torsemide 20 MG tablet Commonly known as: DEMADEX TAKE 2 TABS EVERY OTHER DAY. PLEASE MAKE OVERDUE APPT WITH DR. KLEIN BEFORE ANYMORE REFILLS. What changed: additional instructions   traMADol 50 MG tablet Commonly known as: ULTRAM Take 0.5-1 tablets (25-50 mg total) by mouth every 12 (twelve) hours as needed.   Tresiba FlexTouch 200 UNIT/ML   FlexTouch Pen Generic drug: insulin degludec INJECT 44 UNITS SUBCUTANEOUSLY ONCE DAILY   Victoza 18 MG/3ML Sopn Generic drug: liraglutide INJECT 0.3ML (1.8MG TOTAL) INTO THE SKIN DAILY BEFORE SUPPER   vitamin B-12 100 MCG tablet Commonly known as: CYANOCOBALAMIN Take 100 mcg by mouth daily.       Allergies:  Allergies  Allergen Reactions  . Ativan [Lorazepam] Anxiety    Pt gets more agitated  . Adhesive [Tape] Other (See Comments)    blisters    Past Medical History:  Diagnosis Date  . Acute cholecystitis s/p lap cholecystectomy 03/05/2017 03/04/2017  . Anemia, iron deficiency   . Anxiety   . Arthritis   . Atrial fibrillation with rapid ventricular response ([place])   . BPH (benign prostatic hypertrophy)   . CAD (coronary artery disease)    Nonobstructive CAD per cath  . Cardiac pacemaker in situ   . CHF (congestive heart failure) ([place])   . Chronic ulcer of right foot ([place])   . CKD (chronic kidney disease) stage 4, GFR 15-29 ml/min (H[facility]) 09/26/2017  . CKD (chronic kidney disease), stage III ([place]) secondary to DM and HTN   nephrologist-  [name]  . Coronary artery disease involving native coronary artery of native heart without angina pectoris   . Diastolic dysfunction   . Dyspnea   . 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 History of cellulitis    right great toe 10-25-2014  . History of skin cancer   . HOCM (hypertrophic obstructive cardiomyopathy) ([place])   . Hyperlipidemia associated with type 2  diabetes mellitus ([place]) 10/25/2013  . Hypertension   . Hypertension associated with diabetes ([place]) 10/25/2013  . 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 Insulin dependent type 2 diabetes mellitus ([place]) 1991   followd by [name]--  has insulin pump  . Insulin pump in place   . OSA on CPAP   . 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 Peripheral neuropathy    severe  . Peripheral vascular disease ([place])    bilateral lower extremities  . PVD (peripheral vascular disease) ([place])   . Rib fracture 07/24/2015  . Seasonal and perennial allergic rhinitis 10/25/2013  . Seborrheic keratosis 09/19/2016  . Secondary hyperparathyroidism of renal origin ([place])   . 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 Sinus node dysfunction ([facility])   . Sleep apnea   . Syncope 07/24/2015    Past Surgical History:  Procedure Laterality Date  . AMPUTATION OF REPLICATED TOES  Mar 9892   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)  . AMPUTATION TOE Right 03/12/2015   Procedure: RIGHT HALLUS AMPUTATION ;  Surgeon: [name], MD;  Location: [facility];  Service: Podiatry;  Laterality: Right;  . CARDIAC CATHETERIZATION  11-25-2010   [place], [place]   Nonobstructive CAD  . CARDIAC PACEMAKER PLACEMENT  Nov 2009   [employer]  . CHOLECYSTECTOMY N/A 03/05/2017   Procedure: LAPAROSCOPIC CHOLECYSTECTOMY WITH INTRAOPERATIVE CHOLANGIOGRAM;  Surgeon: [name], MD;  Location: [facility];  Service: General;  Laterality: N/A;  . EP IMPLANTABLE DEVICE N/A 06/03/2015   Procedure: PPM Generator Changeout;  Surgeon: [name], MD;  Location: [facility];  Service: Cardiovascular;  Laterality: N/A;  . EXCISION BONE CYST Right 03/06/2015   Procedure: BONE BIOPSIES OF RIGHT FOOT;  Surgeon: [name], MD;  Location: [facility];  Service: Podiatry;  Laterality: Right;  . ORIF ANKLE FRACTURE Left 11/06/2014   Procedure: OPEN REDUCTION  INTERNAL FIXATION (ORIF) LEFT  ANKLE FRACTURE;  Surgeon: [name],  MD;  Location: [facility];  Service: Orthopedics;  Laterality: Left;  . TEE WITHOUT CARDIOVERSION N/A 09/01/2017   Procedure: TRANSESOPHAGEAL ECHOCARDIOGRAM (TEE);  Surgeon: [name], MD;  Location: [facility];  Service: Cardiovascular;  Laterality: N/A;  . TOTAL KNEE ARTHROPLASTY    . VEIN LIGATION AND STRIPPING      Family History  Problem Relation Age of Onset  . Cancer Mother        breast  . Heart attack Father   . Stroke Father     Social History:  reports that he quit smoking about 36 years ago. He has a 60.00 pack-year smoking history. He has never used smokeless tobacco. He reports current alcohol use of about 1.0 standard drink of alcohol per week. He reports that he does not use drugs.    Review of Systems         Lipids: Had been on and off Lipitor since late 2014 and is now taking 10 mg.   LDL is below 70 Previously had high ALT with Lipitor but now doing well with 10 mg  HDL is consistently low  He is taking OTC  fish oil  Also taking cholestyramine for other reasons and triglycerides are relatively high   Lab Results  Component Value Date   CHOL 118 12/25/2019   CHOL 108 03/01/2019   CHOL 138 01/06/2019   Lab Results  Component Value Date   HDL 23.60 (L) 12/25/2019   HDL 22.10 (L) 03/01/2019   HDL 23 (L) 01/06/2019   Lab Results  Component Value Date   LDLCALC 73 01/06/2019   LDLCALC 62 07/17/2018   LDLCALC 106 (H) 08/31/2017   Lab Results  Component Value Date   TRIG 217.0 (H) 12/25/2019   TRIG 255.0 (H) 03/01/2019   TRIG 210 (H) 01/06/2019   Lab Results  Component Value Date   CHOLHDL 5 12/25/2019   CHOLHDL 5 03/01/2019   CHOLHDL 6.0 01/06/2019   Lab Results  Component Value Date   LDLDIRECT 45.0 12/25/2019   LDLDIRECT 37.0 03/01/2019   LDLDIRECT 50.0 12/27/2017    Lab Results  Component Value Date   ALT 14 12/25/2019       HYPERTENSION: The blood pressure has been treated with various drugs including  carvedilol 25 mg by other physicians. Bp at home 140/60  BP Readings from Last 3 Encounters:  04/02/20 (!) 150/72  03/18/20 (!) 148/52  02/26/20 (!) 107/42     Chronic kidney disease: Has variable results, is followed by nephrologist and has been told to have a fistula placed now   Creatinine levels recently:  Lab Results  Component Value Date   CREATININE 2.75 (H) 03/30/2020   CREATININE 2.69 (H) 03/18/2020   CREATININE 2.98 (H) 01/31/2020         Has history of significant loss of sensation in his feet  since about 2013  He has been prescribed diabetic shoes He is  under the care of a podiatrist, recently regular  Vitamin B12 deficiency: managed with monthly injections  Lab Results  Component Value Date   VITAMINB12 500 03/06/2019   Continues to get treatment for anemia of chronic disease, now on ESA injections Was getting iron infusions for iron deficiency    Physical Examination:  BP (!) 150/72   Pulse 65   Ht 6' 4" (1.93 m)   Wt 279 lb (126.6 kg)   SpO2 98%  BMI 33.96 kg/m     ASSESSMENT:  Diabetes type 2, with obesity  See history of present illness for detailed discussion of current diabetes management, blood sugar patterns and problems identified  He is on basal bolus insulin injections and Victoza   A1c is 6.5 and relatively better; likely falsely low because of his anemia  Overall likely doing a little better with the diet causing improved blood sugars since insulin doses have been about the same He still has excessive high blood sugars at times in the afternoon and early evening with snacking in the afternoon and sometimes getting sweets No postprandial readings usually done Also has difficulty losing weight as before   PLAN:   He should check readings periodically about 2 hours after dinner and occasionally after his lunch, his wife will help him with this  Have consistent protein at each meal  NovoLog 5 units with any carbohydrate  intake like when he is eating relatively large carbohydrate snacks in the afternoon or sweets  You can take additional 2 units of NovoLog if eating a larger meal or more carbohydrates or if blood sugars are higher before eating  He will continue the same dose of Tresiba, 44 units  Continue 1.8 mg Victoza daily  Consider water aerobics if able to go for starting exercise  A1c in 3 months   Patient Instructions  NovoLog 5 units with carbohydrate intake like when  eating a half Sandwiches  Check blood sugars on waking up 3-4 days a week  Also check blood sugars about 2 hours after meals and do this after different meals by rotation  Recommended blood sugar levels on waking up are 90-130 and about 2 hours after meal is 130-160  Please bring your blood sugar monitor to each visit, thank you         Elayne Snare 04/02/2020, 8:32 PM   Note: This office note was prepared with Dragon voice recognition system technology. Any transcriptional errors that result from this process are unintentional.

## 2020-04-03 ENCOUNTER — Ambulatory Visit: Payer: Self-pay

## 2020-04-03 ENCOUNTER — Ambulatory Visit (INDEPENDENT_AMBULATORY_CARE_PROVIDER_SITE_OTHER): Payer: Medicare Other | Admitting: Family Medicine

## 2020-04-03 DIAGNOSIS — G8929 Other chronic pain: Secondary | ICD-10-CM

## 2020-04-03 DIAGNOSIS — E1151 Type 2 diabetes mellitus with diabetic peripheral angiopathy without gangrene: Secondary | ICD-10-CM | POA: Diagnosis not present

## 2020-04-03 DIAGNOSIS — E1122 Type 2 diabetes mellitus with diabetic chronic kidney disease: Secondary | ICD-10-CM | POA: Diagnosis not present

## 2020-04-03 DIAGNOSIS — M1712 Unilateral primary osteoarthritis, left knee: Secondary | ICD-10-CM

## 2020-04-03 DIAGNOSIS — I69311 Memory deficit following cerebral infarction: Secondary | ICD-10-CM | POA: Diagnosis not present

## 2020-04-03 DIAGNOSIS — N184 Chronic kidney disease, stage 4 (severe): Secondary | ICD-10-CM | POA: Diagnosis not present

## 2020-04-03 DIAGNOSIS — I421 Obstructive hypertrophic cardiomyopathy: Secondary | ICD-10-CM | POA: Diagnosis not present

## 2020-04-03 DIAGNOSIS — M25562 Pain in left knee: Secondary | ICD-10-CM

## 2020-04-03 DIAGNOSIS — I69392 Facial weakness following cerebral infarction: Secondary | ICD-10-CM | POA: Diagnosis not present

## 2020-04-03 NOTE — Patient Instructions (Addendum)
Thank you for coming in today.  Call or go to the ER if you develop a large red swollen joint with extreme pain or oozing puss.   Lets keep the appointment for 11/29.  If the injection today does not help we will consider not doing the 4th injection and make a decision about the nerve ablation.   Geniculate nerve ablation or [place] Procedure.

## 2020-04-03 NOTE — Progress Notes (Signed)
David [name] presents to clinic today for left knee Orthovisc injection 3/4.  He does not think it helped very much.  As noted earlier he has poor health and is in the process of transitioning to dialysis.  He does not think that he is a very good surgical candidate.  Procedure: Real-time Ultrasound Guided Injection of knee superior lateral patellar space Device: Philips Affiniti 50G Images permanently stored and available for review in PACS Verbal informed consent obtained.  Discussed risks and benefits of procedure. Warned about infection bleeding damage to structures skin hypopigmentation and fat atrophy among others. Patient expresses understanding and agreement Time-out conducted.   Noted no overlying erythema, induration, or other signs of local infection.   Skin prepped in a sterile fashion.   Local anesthesia: Topical Ethyl chloride.   With sterile technique and under real time ultrasound guidance:  Orthovisc injected into knee joint. Fluid seen entering the joint capsule.   Completed without difficulty   Advised to call if fevers/chills, erythema, induration, drainage, or persistent bleeding.   Images permanently stored and available for review in the ultrasound unit.  Impression: Technically successful ultrasound guided injection.  Had discussion with patient regarding future plans.  If he does feel much benefit with 3 Orthovisc injections we may want to consider not doing the fourth.  Discussed next steps.  Since he is not a good surgical candidate would consider genicular nerve ablation.  Discussed the procedure.  Patient is scheduled on 29 November for his fourth injection.  Recommend that he keep that appointment and will make a decision on the day about proceeding with fourth injection.   Lot #[id number]

## 2020-04-06 DIAGNOSIS — I69392 Facial weakness following cerebral infarction: Secondary | ICD-10-CM | POA: Diagnosis not present

## 2020-04-06 DIAGNOSIS — E1122 Type 2 diabetes mellitus with diabetic chronic kidney disease: Secondary | ICD-10-CM | POA: Diagnosis not present

## 2020-04-06 DIAGNOSIS — I69311 Memory deficit following cerebral infarction: Secondary | ICD-10-CM | POA: Diagnosis not present

## 2020-04-06 DIAGNOSIS — N184 Chronic kidney disease, stage 4 (severe): Secondary | ICD-10-CM | POA: Diagnosis not present

## 2020-04-06 DIAGNOSIS — E1151 Type 2 diabetes mellitus with diabetic peripheral angiopathy without gangrene: Secondary | ICD-10-CM | POA: Diagnosis not present

## 2020-04-06 DIAGNOSIS — I421 Obstructive hypertrophic cardiomyopathy: Secondary | ICD-10-CM | POA: Diagnosis not present

## 2020-04-07 ENCOUNTER — Other Ambulatory Visit: Payer: Self-pay

## 2020-04-07 ENCOUNTER — Encounter: Payer: Self-pay | Admitting: Vascular Surgery

## 2020-04-07 ENCOUNTER — Ambulatory Visit (HOSPITAL_COMMUNITY)
Admission: RE | Admit: 2020-04-07 | Discharge: 2020-04-07 | Disposition: A | Discharge status: Home / Self Care | Payer: Medicare Other | Source: Ambulatory Visit | Attending: Vascular Surgery | Admitting: Vascular Surgery

## 2020-04-07 ENCOUNTER — Ambulatory Visit (INDEPENDENT_AMBULATORY_CARE_PROVIDER_SITE_OTHER): Payer: Medicare Other | Admitting: Vascular Surgery

## 2020-04-07 ENCOUNTER — Ambulatory Visit (INDEPENDENT_AMBULATORY_CARE_PROVIDER_SITE_OTHER)
Admission: RE | Admit: 2020-04-07 | Discharge: 2020-04-07 | Disposition: A | Discharge status: Home / Self Care | Payer: Medicare Other | Source: Ambulatory Visit | Attending: Vascular Surgery | Admitting: Vascular Surgery

## 2020-04-07 VITALS — BP 133/77 | HR 70 | Temp 97.7°F | Resp 20 | Ht 76.0 in | Wt 279.0 lb

## 2020-04-07 DIAGNOSIS — N184 Chronic kidney disease, stage 4 (severe): Secondary | ICD-10-CM

## 2020-04-07 NOTE — H&P (View-Only) (Signed)
ASSESSMENT & PLAN:  79 y.o. 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  I reviewed details of hemodialysis, including the need for dialysis sessions three times weekly, the need for surveillance and maintenance of dialysis access, the likelihood of needing creation of new access in the future.  I reviewed the risk of steal syndrome and worrisome features to monitor for after dialysis access creation.  He is not yet ready to decide on a date for surgery. I offered him next available, but he wishes to talk to his wife. We will plan for left brachiocephalic AV fistula creation whenever patient desires.  CHIEF COMPLAINT:   CKD4 in need of dialysis access  HISTORY:  HISTORY OF PRESENT ILLNESS: David [name] is a 79 y.o. male CKD4 who presents to clinic for evaluation of dialysis access. He has never needed dialysis before. He denies any history of trauma or surgery to the upper extremities. He has a left sided pacemaker. He is right-hand dominant. Never had access surgery before.  Past Medical History:  Diagnosis Date  . Acute cholecystitis s/p lap cholecystectomy 03/05/2017 03/04/2017  . Anemia, iron deficiency   . Anxiety   . Arthritis   . Atrial fibrillation with rapid ventricular response ([place])   . BPH (benign prostatic hypertrophy)   . CAD (coronary artery disease)    Nonobstructive CAD per cath  . Cardiac pacemaker in situ   . CHF (congestive heart failure) ([place])   . Chronic ulcer of right foot ([place])   . CKD (chronic kidney disease) stage 4, GFR 15-29 ml/min (HCC) 09/26/2017  . CKD (chronic kidney disease), stage III ([place]) secondary to DM and HTN   nephrologist-  [name]  . Coronary artery disease involving native coronary artery of native heart without angina pectoris   . Diastolic dysfunction   . Dyspnea   . 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 History of cellulitis    right great toe 10-25-2014  . History of skin cancer    . HOCM (hypertrophic obstructive cardiomyopathy) ([place])   . Hyperlipidemia associated with type 2 diabetes mellitus ([place]) 10/25/2013  . Hypertension   . Hypertension associated with diabetes ([place]) 10/25/2013  . 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 Insulin pump in place   . OSA on CPAP   . 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 Peripheral neuropathy    severe  . Peripheral vascular disease ([place])    bilateral lower extremities  . PVD (peripheral vascular disease) ([place])   . Rib fracture 07/24/2015  . Seasonal and perennial allergic rhinitis 10/25/2013  . Seborrheic keratosis 09/19/2016  . Secondary hyperparathyroidism of renal origin ([place])   . 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 Sinus node dysfunction (H[facility])   . Sleep apnea   . Syncope 07/24/2015    Past Surgical History:  Procedure Laterality Date  . AMPUTATION OF REPLICATED TOES  Mar 3614   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)  . AMPUTATION TOE Right 03/12/2015   Procedure: RIGHT HALLUS AMPUTATION ;  Surgeon: [name], MD;  Location: [facility];  Service: Podiatry;  Laterality: Right;  . CARDIAC CATHETERIZATION  11-25-2010   [place], [place]   Nonobstructive CAD  . CARDIAC PACEMAKER PLACEMENT  Nov 2009   [employer]  . CHOLECYSTECTOMY N/A 03/05/2017   Procedure: LAPAROSCOPIC CHOLECYSTECTOMY WITH  INTRAOPERATIVE CHOLANGIOGRAM;  Surgeon: [name], MD;  Location: [facility];  Service: General;  Laterality: N/A;  . EP IMPLANTABLE DEVICE N/A 06/03/2015   Procedure: PPM Generator Changeout;  Surgeon: [name], MD;  Location: [facility];  Service: Cardiovascular;  Laterality: N/A;  . EXCISION BONE CYST Right 03/06/2015   Procedure: BONE BIOPSIES OF RIGHT FOOT;  Surgeon: [name], MD;  Location: [facility];  Service: Podiatry;  Laterality: Right;  . ORIF ANKLE FRACTURE Left  11/06/2014   Procedure: OPEN REDUCTION INTERNAL FIXATION (ORIF) LEFT  ANKLE FRACTURE;  Surgeon: [name], MD;  Location: [facility];  Service: Orthopedics;  Laterality: Left;  . TEE WITHOUT CARDIOVERSION N/A 09/01/2017   Procedure: TRANSESOPHAGEAL ECHOCARDIOGRAM (TEE);  Surgeon: [name], MD;  Location: [facility];  Service: Cardiovascular;  Laterality: N/A;  . TOTAL KNEE ARTHROPLASTY    . VEIN LIGATION AND STRIPPING      Family History  Problem Relation Age of Onset  . Cancer Mother        breast  . Heart attack Father   . Stroke Father     Social History   Socioeconomic History  . Marital status: Married    Spouse name: Not on file  . Number of children: 3  . Years of education: 56  . Highest education level: Not on file  Occupational History  . Occupation: Retired  Tobacco Use  . Smoking status: Former Smoker    Packs/day: 2.00    Years: 30.00    Pack years: 60.00    Quit date: 03/03/1984    Years since quitting: 36.1  . Smokeless tobacco: Never Used  Vaping Use  . Vaping Use: Never used  Substance and Sexual Activity  . Alcohol use: Yes    Alcohol/week: 1.0 standard drink    Types: 1 Glasses of wine per week    Comment: social  . Drug use: No  . Sexual activity: Not Currently  Other Topics Concern  . Not on file  Social History Narrative   Currently resides with his wife. 1 dog. Fun/Hobby: Golf    Denies any religious beliefs effecting health care.    Social Determinants of Health   Financial Resource Strain:   . Difficulty of Paying Living Expenses: Not on file  Food Insecurity:   . Worried About Charity fundraiser in the Last Year: Not on file  . Ran Out of Food in the Last Year: Not on file  Transportation Needs:   . Lack of Transportation (Medical): Not on file  . Lack of Transportation (Non-Medical): Not on file  Physical Activity:   . Days of Exercise per Week: Not on file  . Minutes of Exercise per Session: Not on file   Stress:   . Feeling of Stress : Not on file  Social Connections:   . Frequency of Communication with Friends and Family: Not on file  . Frequency of Social Gatherings with Friends and Family: Not on file  . Attends Religious Services: Not on file  . Active Member of Clubs or Organizations: Not on file  . Attends Archivist Meetings: Not on file  . Marital Status: Not on file  Intimate Partner Violence:   . Fear of Current or Ex-Partner: Not on file  . Emotionally Abused: Not on file  . Physically Abused: Not on file  . Sexually Abused: Not on file    Allergies  Allergen Reactions  . Ativan [Lorazepam]  Anxiety    Pt gets more agitated  . Adhesive [Tape] Other (See Comments)    blisters    Current Outpatient Medications  Medication Sig Dispense Refill  . acetaminophen (TYLENOL) 325 MG tablet Take 1-2 tablets (325-650 mg total) by mouth every 4 (four) hours as needed for mild pain.    Marland Kitchen amLODipine (NORVASC) 5 MG tablet TAKE 1 TABLET BY MOUTH EVERY DAY 90 tablet 1  . atorvastatin (LIPITOR) 10 MG tablet TAKE 1 TABLET BY MOUTH EVERY DAY AT 6PM 90 tablet 1  . blood glucose meter kit and supplies KIT Dispense based on patient and insurance preference. Use up to four times daily as directed. (FOR ICD-9 250.00, 250.01). 1 each 0  . calcitRIOL (ROCALTROL) 0.25 MCG capsule Take 1 capsule by mouth once daily 90 capsule 0  . carvedilol (COREG) 25 MG tablet TAKE 1 TABLET BY MOUTH TWICE DAILY WITH A MEAL 180 tablet 0  . cholecalciferol (VITAMIN D) 1000 units tablet Take 1 tablet (1,000 Units total) by mouth daily. 30 tablet 1  . cholestyramine light (PREVALITE) 4 g packet Take 4 g by mouth daily.    . clopidogrel (PLAVIX) 75 MG tablet TAKE 1 TABLET BY MOUTH EVERY DAY 90 tablet 0  . folic acid (FOLVITE) 1 MG tablet Take 1 mg by mouth daily.    Marland Kitchen glucose blood test strip Use Contour test strips as instructed to check blood sugar four times daily. 200 each 2  . hydrALAZINE (APRESOLINE)  100 MG tablet TAKE 1 TABLET BY MOUTH TWICE A DAY 180 tablet 0  . insulin aspart (NOVOLOG FLEXPEN) 100 UNIT/ML FlexPen INJECT 10 UNITS SUBCUTANEOUSLY BEFORE THE FIRST MEAL OF THE DAY AND 12 UNITS AT DINNER 15 mL 1  . Insulin Syringe-Needle U-100 (INSULIN SYRINGE 1CC/31GX5/16") 31G X 5/16" 1 ML MISC 1 each by Does not apply route 3 (three) times daily. Use insulin syringe to inject insulin three times daily. 100 each 2  . isosorbide mononitrate (IMDUR) 30 MG 24 hr tablet TAKE 2 TABLETS DAILY. PLEASE MAKE OVERDUE APPT WITH DR. Caryl Comes BEFORE ANYMORE REFILLS. 1ST ATTEMPT 180 tablet 3  . Omega-3 Fatty Acids (FISH OIL) 1000 MG CAPS Take 1 capsule (1,000 mg total) by mouth every morning. 30 capsule 1  . pantoprazole (PROTONIX) 40 MG tablet TAKE 1 TABLET BY MOUTH EVERY DAY 90 tablet 0  . torsemide (DEMADEX) 20 MG tablet TAKE 2 TABS EVERY OTHER DAY. PLEASE MAKE OVERDUE APPT WITH DR. Caryl Comes BEFORE ANYMORE REFILLS. (Patient taking differently: TAKE 2 TABS EVERY OTHER DAY. PLEASE MAKE OVERDUE APPT WITH DR. Caryl Comes BEFORE ANYMORE REFILLS. Takes 2 tablets one day, then alternates with 1 tablet the next day) 90 tablet 3  . traMADol (ULTRAM) 50 MG tablet Take 0.5-1 tablets (25-50 mg total) by mouth every 12 (twelve) hours as needed. 30 tablet 0  . TRESIBA FLEXTOUCH 200 UNIT/ML FlexTouch Pen INJECT 44 UNITS SUBCUTANEOUSLY ONCE DAILY 9 mL 2  . VICTOZA 18 MG/3ML SOPN INJECT 0.3ML (1.8MG TOTAL) INTO THE SKIN DAILY BEFORE SUPPER 9 mL 2  . vitamin B-12 (CYANOCOBALAMIN) 100 MCG tablet Take 100 mcg by mouth daily.    . sertraline (ZOLOFT) 100 MG tablet Take 1 tablet (100 mg total) by mouth daily. 90 tablet 1   No current facility-administered medications for this visit.    REVIEW OF SYSTEMS:  _0  denotes positive finding, _1  denotes negative finding Cardiac  Comments:  Chest pain or chest pressure:    Shortness of breath upon exertion:  Short of breath when lying flat:    Irregular heart rhythm:        Vascular     Pain in calf, thigh, or hip brought on by ambulation:    Pain in feet at night that wakes you up from your sleep:     Blood clot in your veins:    Leg swelling:         Pulmonary    Oxygen at home:    Productive cough:     Wheezing:         Neurologic    Sudden weakness in arms or legs:     Sudden numbness in arms or legs:     Sudden onset of difficulty speaking or slurred speech:    Temporary loss of vision in one eye:     Problems with dizziness:         Gastrointestinal    Blood in stool:     Vomited blood:         Genitourinary    Burning when urinating:     Blood in urine:        Psychiatric    Major depression:         Hematologic    Bleeding problems:    Problems with blood clotting too easily:        Skin    Rashes or ulcers:        Constitutional    Fever or chills:     PHYSICAL EXAM:   Vitals:   04/07/20 1317  BP: 133/77  Pulse: 70  Resp: 20  Temp: 97.7 F (36.5 C)  SpO2: 98%  Weight: 279 lb (126.6 kg)  Height: _0  (1.93 m)   Constitutional: Well appearing in no distress. Appears well nourished.  Neurologic: Normal gait and station. CN intact. No weakness. No sensory loss. Psychiatric: Mood and affect symmetric and appropriate. Eyes: No icterus. No conjunctival pallor. Ears, nose, throat: mucous membranes moist. Midline trachea. No carotid bruit. Cardiac: regular rate and rhythm.  Respiratory: unlabored. Abdominal: soft, non-tender, non-distended. No palpable pulsatile abdominal mass. Peripheral vascular:  Brachial pulse: L 2+ / R 2+  Radial pulse: L 1+ / R 2+ Extremity: No edema. No cyanosis. No pallor.  Skin: No gangrene. No ulceration.  Lymphatic: No Stemmer's sign. No palpable lymphadenopathy.   DATA REVIEW:    Most recent CBC CBC Latest Ref Rng & Units 03/18/2020 02/19/2020 01/31/2020  WBC 4.0 - 10.5 K/uL - - -  Hemoglobin 13.0 - 17.0 g/dL 9.4(L) 8.6(L) 8.3(L)  Hematocrit 39 - 52 % - - -  Platelets 150 - 400 K/uL - - -      Most recent CMP CMP Latest Ref Rng & Units 03/30/2020 03/18/2020 01/31/2020  Glucose 70 - 99 mg/dL 82 180(H) 136(H)  BUN 6 - 23 mg/dL 62(H) 58(H) 54(H)  Creatinine 0.40 - 1.50 mg/dL 2.75(H) 2.69(H) 2.98(H)  Sodium 135 - 145 mEq/L 144 141 140  Potassium 3.5 - 5.1 mEq/L 4.1 4.0 3.8  [place] 96 - 112 mEq/L 111 108 110  CO2 19 - 32 mEq/L 23 22 19(L)  Calcium 8.4 - 10.5 mg/dL 9.3 9.3 8.6(L)  Total Protein 6.0 - 8.3 g/dL - - -  Total Bilirubin 0.2 - 1.2 mg/dL - - -  Alkaline Phos 39 - 117 U/L - - -  AST 0 - 37 U/L - - -  ALT 0 - 53 U/L - - -    Renal function Estimated Creatinine Clearance: 31.6  mL/min (A) (by C-G formula based on SCr of 2.75 mg/dL (H)).  Hemoglobin-A1c (%)  Date Value  10/26/2014 11.5 (H)   Hgb A1c MFr Bld (%)  Date Value  03/30/2020 6.5    LDL Cholesterol  Date Value Ref Range Status  01/06/2019 73 0 - 99 mg/dL Final    Comment:           Total Cholesterol/HDL:CHD Risk Coronary Heart Disease Risk Table                     Men   Women  1/2 Average Risk   3.4   3.3  Average Risk       5.0   4.4  2 X Average Risk   9.6   7.1  3 X Average Risk  23.4   11.0        Use the calculated Patient Ratio above and the CHD Risk Table to determine the patient's CHD Risk.        ATP III CLASSIFICATION (LDL):  <100     mg/dL   Optimal  100-129  mg/dL   Near or Above                    Optimal  130-159  mg/dL   Borderline  160-189  mg/dL   High  >190     mg/dL   Very High Performed at [facility] [address], [place], [place] [address]    Direct LDL  Date Value Ref Range Status  12/25/2019 45.0 mg/dL Final    Comment:    Optimal:  <100 mg/dLNear or Above Optimal:  100-129 mg/dLBorderline High:  130-159 mg/dLHigh:  160-189 mg/dLVery High:  >190 mg/dL       [name]. Stanford Breed, MD [facility] Phone Number: [phone] 04/07/2020 2:01 PM

## 2020-04-07 NOTE — Progress Notes (Signed)
ASSESSMENT & PLAN:  79 y.o. 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  I reviewed details of hemodialysis, including the need for dialysis sessions three times weekly, the need for surveillance and maintenance of dialysis access, the likelihood of needing creation of new access in the future.  I reviewed the risk of steal syndrome and worrisome features to monitor for after dialysis access creation.  He is not yet ready to decide on a date for surgery. I offered him next available, but he wishes to talk to his wife. We will plan for left brachiocephalic AV fistula creation whenever patient desires.  CHIEF COMPLAINT:   CKD4 in need of dialysis access  HISTORY:  HISTORY OF PRESENT ILLNESS: David [name] is a 79 y.o. male CKD4 who presents to clinic for evaluation of dialysis access. He has never needed dialysis before. He denies any history of trauma or surgery to the upper extremities. He has a left sided pacemaker. He is right-hand dominant. Never had access surgery before.  Past Medical History:  Diagnosis Date  . Acute cholecystitis s/p lap cholecystectomy 03/05/2017 03/04/2017  . Anemia, iron deficiency   . Anxiety   . Arthritis   . Atrial fibrillation with rapid ventricular response ([place])   . BPH (benign prostatic hypertrophy)   . CAD (coronary artery disease)    Nonobstructive CAD per cath  . Cardiac pacemaker in situ   . CHF (congestive heart failure) ([place])   . Chronic ulcer of right foot ([place])   . CKD (chronic kidney disease) stage 4, GFR 15-29 ml/min (HCC) 09/26/2017  . CKD (chronic kidney disease), stage III ([place]) secondary to DM and HTN   nephrologist-  [name]  . Coronary artery disease involving native coronary artery of native heart without angina pectoris   . Diastolic dysfunction   . Dyspnea   . 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 History of cellulitis    right great toe 10-25-2014  . History of skin cancer    . HOCM (hypertrophic obstructive cardiomyopathy) ([place])   . Hyperlipidemia associated with type 2 diabetes mellitus ([place]) 10/25/2013  . Hypertension   . Hypertension associated with diabetes ([place]) 10/25/2013  . 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 Insulin pump in place   . OSA on CPAP   . 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 Peripheral neuropathy    severe  . Peripheral vascular disease ([place])    bilateral lower extremities  . PVD (peripheral vascular disease) ([place])   . Rib fracture 07/24/2015  . Seasonal and perennial allergic rhinitis 10/25/2013  . Seborrheic keratosis 09/19/2016  . Secondary hyperparathyroidism of renal origin ([place])   . 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 Sinus node dysfunction (HCC)   . Sleep apnea   . Syncope 07/24/2015    Past Surgical History:  Procedure Laterality Date  . AMPUTATION OF REPLICATED TOES  Mar 3614   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)  . AMPUTATION TOE Right 03/12/2015   Procedure: RIGHT HALLUS AMPUTATION ;  Surgeon: [name], MD;  Location: [facility];  Service: Podiatry;  Laterality: Right;  . CARDIAC CATHETERIZATION  11-25-2010   [place], [place]   Nonobstructive CAD  . CARDIAC PACEMAKER PLACEMENT  Nov 2009   [employer]  . CHOLECYSTECTOMY N/A 03/05/2017   Procedure: LAPAROSCOPIC CHOLECYSTECTOMY WITH  INTRAOPERATIVE CHOLANGIOGRAM;  Surgeon: [name], MD;  Location: [facility];  Service: General;  Laterality: N/A;  . EP IMPLANTABLE DEVICE N/A 06/03/2015   Procedure: PPM Generator Changeout;  Surgeon: [name], MD;  Location: [facility];  Service: Cardiovascular;  Laterality: N/A;  . EXCISION BONE CYST Right 03/06/2015   Procedure: BONE BIOPSIES OF RIGHT FOOT;  Surgeon: [name], MD;  Location: [facility];  Service: Podiatry;  Laterality: Right;  . ORIF ANKLE FRACTURE Left  11/06/2014   Procedure: OPEN REDUCTION INTERNAL FIXATION (ORIF) LEFT  ANKLE FRACTURE;  Surgeon: [name], MD;  Location: [facility];  Service: Orthopedics;  Laterality: Left;  . TEE WITHOUT CARDIOVERSION N/A 09/01/2017   Procedure: TRANSESOPHAGEAL ECHOCARDIOGRAM (TEE);  Surgeon: [name], MD;  Location: [facility];  Service: Cardiovascular;  Laterality: N/A;  . TOTAL KNEE ARTHROPLASTY    . VEIN LIGATION AND STRIPPING      Family History  Problem Relation Age of Onset  . Cancer Mother        breast  . Heart attack Father   . Stroke Father     Social History   Socioeconomic History  . Marital status: Married    Spouse name: Not on file  . Number of children: 3  . Years of education: 56  . Highest education level: Not on file  Occupational History  . Occupation: Retired  Tobacco Use  . Smoking status: Former Smoker    Packs/day: 2.00    Years: 30.00    Pack years: 60.00    Quit date: 03/03/1984    Years since quitting: 36.1  . Smokeless tobacco: Never Used  Vaping Use  . Vaping Use: Never used  Substance and Sexual Activity  . Alcohol use: Yes    Alcohol/week: 1.0 standard drink    Types: 1 Glasses of wine per week    Comment: social  . Drug use: No  . Sexual activity: Not Currently  Other Topics Concern  . Not on file  Social History Narrative   Currently resides with his wife. 1 dog. Fun/Hobby: Golf    Denies any religious beliefs effecting health care.    Social Determinants of Health   Financial Resource Strain:   . Difficulty of Paying Living Expenses: Not on file  Food Insecurity:   . Worried About Charity fundraiser in the Last Year: Not on file  . Ran Out of Food in the Last Year: Not on file  Transportation Needs:   . Lack of Transportation (Medical): Not on file  . Lack of Transportation (Non-Medical): Not on file  Physical Activity:   . Days of Exercise per Week: Not on file  . Minutes of Exercise per Session: Not on file   Stress:   . Feeling of Stress : Not on file  Social Connections:   . Frequency of Communication with Friends and Family: Not on file  . Frequency of Social Gatherings with Friends and Family: Not on file  . Attends Religious Services: Not on file  . Active Member of Clubs or Organizations: Not on file  . Attends Archivist Meetings: Not on file  . Marital Status: Not on file  Intimate Partner Violence:   . Fear of Current or Ex-Partner: Not on file  . Emotionally Abused: Not on file  . Physically Abused: Not on file  . Sexually Abused: Not on file    Allergies  Allergen Reactions  . Ativan [Lorazepam]  Anxiety    Pt gets more agitated  . Adhesive [Tape] Other (See Comments)    blisters    Current Outpatient Medications  Medication Sig Dispense Refill  . acetaminophen (TYLENOL) 325 MG tablet Take 1-2 tablets (325-650 mg total) by mouth every 4 (four) hours as needed for mild pain.    Marland Kitchen amLODipine (NORVASC) 5 MG tablet TAKE 1 TABLET BY MOUTH EVERY DAY 90 tablet 1  . atorvastatin (LIPITOR) 10 MG tablet TAKE 1 TABLET BY MOUTH EVERY DAY AT 6PM 90 tablet 1  . blood glucose meter kit and supplies KIT Dispense based on patient and insurance preference. Use up to four times daily as directed. (FOR ICD-9 250.00, 250.01). 1 each 0  . calcitRIOL (ROCALTROL) 0.25 MCG capsule Take 1 capsule by mouth once daily 90 capsule 0  . carvedilol (COREG) 25 MG tablet TAKE 1 TABLET BY MOUTH TWICE DAILY WITH A MEAL 180 tablet 0  . cholecalciferol (VITAMIN D) 1000 units tablet Take 1 tablet (1,000 Units total) by mouth daily. 30 tablet 1  . cholestyramine light (PREVALITE) 4 g packet Take 4 g by mouth daily.    . clopidogrel (PLAVIX) 75 MG tablet TAKE 1 TABLET BY MOUTH EVERY DAY 90 tablet 0  . folic acid (FOLVITE) 1 MG tablet Take 1 mg by mouth daily.    Marland Kitchen glucose blood test strip Use Contour test strips as instructed to check blood sugar four times daily. 200 each 2  . hydrALAZINE (APRESOLINE)  100 MG tablet TAKE 1 TABLET BY MOUTH TWICE A DAY 180 tablet 0  . insulin aspart (NOVOLOG FLEXPEN) 100 UNIT/ML FlexPen INJECT 10 UNITS SUBCUTANEOUSLY BEFORE THE FIRST MEAL OF THE DAY AND 12 UNITS AT DINNER 15 mL 1  . Insulin Syringe-Needle U-100 (INSULIN SYRINGE 1CC/31GX5/16") 31G X 5/16" 1 ML MISC 1 each by Does not apply route 3 (three) times daily. Use insulin syringe to inject insulin three times daily. 100 each 2  . isosorbide mononitrate (IMDUR) 30 MG 24 hr tablet TAKE 2 TABLETS DAILY. PLEASE MAKE OVERDUE APPT WITH DR. Caryl Comes BEFORE ANYMORE REFILLS. 1ST ATTEMPT 180 tablet 3  . Omega-3 Fatty Acids (FISH OIL) 1000 MG CAPS Take 1 capsule (1,000 mg total) by mouth every morning. 30 capsule 1  . pantoprazole (PROTONIX) 40 MG tablet TAKE 1 TABLET BY MOUTH EVERY DAY 90 tablet 0  . torsemide (DEMADEX) 20 MG tablet TAKE 2 TABS EVERY OTHER DAY. PLEASE MAKE OVERDUE APPT WITH DR. Caryl Comes BEFORE ANYMORE REFILLS. (Patient taking differently: TAKE 2 TABS EVERY OTHER DAY. PLEASE MAKE OVERDUE APPT WITH DR. Caryl Comes BEFORE ANYMORE REFILLS. Takes 2 tablets one day, then alternates with 1 tablet the next day) 90 tablet 3  . traMADol (ULTRAM) 50 MG tablet Take 0.5-1 tablets (25-50 mg total) by mouth every 12 (twelve) hours as needed. 30 tablet 0  . TRESIBA FLEXTOUCH 200 UNIT/ML FlexTouch Pen INJECT 44 UNITS SUBCUTANEOUSLY ONCE DAILY 9 mL 2  . VICTOZA 18 MG/3ML SOPN INJECT 0.3ML (1.8MG TOTAL) INTO THE SKIN DAILY BEFORE SUPPER 9 mL 2  . vitamin B-12 (CYANOCOBALAMIN) 100 MCG tablet Take 100 mcg by mouth daily.    . sertraline (ZOLOFT) 100 MG tablet Take 1 tablet (100 mg total) by mouth daily. 90 tablet 1   No current facility-administered medications for this visit.    REVIEW OF SYSTEMS:  _0  denotes positive finding, _1  denotes negative finding Cardiac  Comments:  Chest pain or chest pressure:    Shortness of breath upon exertion:  Short of breath when lying flat:    Irregular heart rhythm:        Vascular     Pain in calf, thigh, or hip brought on by ambulation:    Pain in feet at night that wakes you up from your sleep:     Blood clot in your veins:    Leg swelling:         Pulmonary    Oxygen at home:    Productive cough:     Wheezing:         Neurologic    Sudden weakness in arms or legs:     Sudden numbness in arms or legs:     Sudden onset of difficulty speaking or slurred speech:    Temporary loss of vision in one eye:     Problems with dizziness:         Gastrointestinal    Blood in stool:     Vomited blood:         Genitourinary    Burning when urinating:     Blood in urine:        Psychiatric    Major depression:         Hematologic    Bleeding problems:    Problems with blood clotting too easily:        Skin    Rashes or ulcers:        Constitutional    Fever or chills:     PHYSICAL EXAM:   Vitals:   04/07/20 1317  BP: 133/77  Pulse: 70  Resp: 20  Temp: 97.7 F (36.5 C)  SpO2: 98%  Weight: 279 lb (126.6 kg)  Height: _0  (1.93 m)   Constitutional: Well appearing in no distress. Appears well nourished.  Neurologic: Normal gait and station. CN intact. No weakness. No sensory loss. Psychiatric: Mood and affect symmetric and appropriate. Eyes: No icterus. No conjunctival pallor. Ears, nose, throat: mucous membranes moist. Midline trachea. No carotid bruit. Cardiac: regular rate and rhythm.  Respiratory: unlabored. Abdominal: soft, non-tender, non-distended. No palpable pulsatile abdominal mass. Peripheral vascular:  Brachial pulse: L 2+ / R 2+  Radial pulse: L 1+ / R 2+ Extremity: No edema. No cyanosis. No pallor.  Skin: No gangrene. No ulceration.  Lymphatic: No Stemmer's sign. No palpable lymphadenopathy.   DATA REVIEW:    Most recent CBC CBC Latest Ref Rng & Units 03/18/2020 02/19/2020 01/31/2020  WBC 4.0 - 10.5 K/uL - - -  Hemoglobin 13.0 - 17.0 g/dL 9.4(L) 8.6(L) 8.3(L)  Hematocrit 39 - 52 % - - -  Platelets 150 - 400 K/uL - - -      Most recent CMP CMP Latest Ref Rng & Units 03/30/2020 03/18/2020 01/31/2020  Glucose 70 - 99 mg/dL 82 180(H) 136(H)  BUN 6 - 23 mg/dL 62(H) 58(H) 54(H)  Creatinine 0.40 - 1.50 mg/dL 2.75(H) 2.69(H) 2.98(H)  Sodium 135 - 145 mEq/L 144 141 140  Potassium 3.5 - 5.1 mEq/L 4.1 4.0 3.8  [place] 96 - 112 mEq/L 111 108 110  CO2 19 - 32 mEq/L 23 22 19(L)  Calcium 8.4 - 10.5 mg/dL 9.3 9.3 8.6(L)  Total Protein 6.0 - 8.3 g/dL - - -  Total Bilirubin 0.2 - 1.2 mg/dL - - -  Alkaline Phos 39 - 117 U/L - - -  AST 0 - 37 U/L - - -  ALT 0 - 53 U/L - - -    Renal function Estimated Creatinine Clearance: 31.6  mL/min (A) (by C-G formula based on SCr of 2.75 mg/dL (H)).  Hemoglobin-A1c (%)  Date Value  10/26/2014 11.5 (H)   Hgb A1c MFr Bld (%)  Date Value  03/30/2020 6.5    LDL Cholesterol  Date Value Ref Range Status  01/06/2019 73 0 - 99 mg/dL Final    Comment:           Total Cholesterol/HDL:CHD Risk Coronary Heart Disease Risk Table                     Men   Women  1/2 Average Risk   3.4   3.3  Average Risk       5.0   4.4  2 X Average Risk   9.6   7.1  3 X Average Risk  23.4   11.0        Use the calculated Patient Ratio above and the CHD Risk Table to determine the patient's CHD Risk.        ATP III CLASSIFICATION (LDL):  <100     mg/dL   Optimal  100-129  mg/dL   Near or Above                    Optimal  130-159  mg/dL   Borderline  160-189  mg/dL   High  >190     mg/dL   Very High Performed at [facility] [address], [place], [place] [address]    Direct LDL  Date Value Ref Range Status  12/25/2019 45.0 mg/dL Final    Comment:    Optimal:  <100 mg/dLNear or Above Optimal:  100-129 mg/dLBorderline High:  130-159 mg/dLHigh:  160-189 mg/dLVery High:  >190 mg/dL       [name]. Stanford Breed, MD [facility] Phone Number: [phone] 04/07/2020 2:01 PM

## 2020-04-08 ENCOUNTER — Encounter: Payer: Self-pay | Admitting: Family Medicine

## 2020-04-08 ENCOUNTER — Ambulatory Visit (INDEPENDENT_AMBULATORY_CARE_PROVIDER_SITE_OTHER): Payer: Medicare Other | Admitting: Family Medicine

## 2020-04-08 VITALS — BP 128/62 | HR 68 | Temp 98.0°F

## 2020-04-08 DIAGNOSIS — I69392 Facial weakness following cerebral infarction: Secondary | ICD-10-CM | POA: Diagnosis not present

## 2020-04-08 DIAGNOSIS — E1151 Type 2 diabetes mellitus with diabetic peripheral angiopathy without gangrene: Secondary | ICD-10-CM | POA: Diagnosis not present

## 2020-04-08 DIAGNOSIS — N184 Chronic kidney disease, stage 4 (severe): Secondary | ICD-10-CM | POA: Diagnosis not present

## 2020-04-08 DIAGNOSIS — I69311 Memory deficit following cerebral infarction: Secondary | ICD-10-CM | POA: Diagnosis not present

## 2020-04-08 DIAGNOSIS — E1122 Type 2 diabetes mellitus with diabetic chronic kidney disease: Secondary | ICD-10-CM | POA: Diagnosis not present

## 2020-04-08 DIAGNOSIS — T162XXA Foreign body in left ear, initial encounter: Secondary | ICD-10-CM

## 2020-04-08 DIAGNOSIS — I421 Obstructive hypertrophic cardiomyopathy: Secondary | ICD-10-CM | POA: Diagnosis not present

## 2020-04-08 NOTE — Progress Notes (Addendum)
Subjective  CC:  Chief Complaint  Patient presents with  . Ear Fullness    left ear, believes to be the tip of his hearing aid   Same day acute visit; PCP not available. New pt to me. Chart reviewed.   HPI: David [name] is a 79 y.o. male who presents to the office today to address the problems listed above in the chief complaint.  Believes tip of hearing aid is in his left ear canal. No pain. Just fullness. Otherwise he is at his baseline.    Assessment  1. Foreign body of left ear, initial encounter      Plan   FB:  Removed intact. Irrigated Radiographer, therapeutic. TM OK. F/u prn  I reviewed the patients updated PMH, FH, and SocHx.    Patient Active Problem List   Diagnosis Date Noted  . Debility 03/20/2020  . Osteoarthritis 03/13/2020  . Diarrhea 02/18/2019  . GERD (gastroesophageal reflux disease) 01/24/2019  . Memory change 11/19/2018  . Senile purpura ([place]) 11/27/2017  . CKD (chronic kidney disease) stage 4, GFR 15-29 ml/min (HCC) 09/26/2017  . 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 Nonobstructive CAD s/p PCI 2012   . Cardiac pacemaker in situ   . PVD (peripheral vascular disease) ([place])   . Anxiety 03/22/2017  . Acute cholecystitis s/p lap cholecystectomy 03/05/2017 03/04/2017  . 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[place]) 03/04/2017  . 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 Fatigue 01/27/2017  . Low vitamin B12 level 01/27/2017  . OSA on CPAP 10/26/2014  . Seasonal and perennial allergic rhinitis 10/25/2013  . Hypertension associated with diabetes ([place]) 10/25/2013  . Hyperlipidemia associated with type 2 diabetes mellitus ([place]) 10/25/2013   Current Meds  Medication Sig  . acetaminophen (TYLENOL) 325 MG tablet Take 1-2 tablets (325-650 mg total) by mouth every 4 (four) hours as needed for mild pain.  Marland Kitchen amLODipine (NORVASC) 5 MG tablet TAKE 1 TABLET BY MOUTH EVERY DAY  . atorvastatin  (LIPITOR) 10 MG tablet TAKE 1 TABLET BY MOUTH EVERY DAY AT 6PM  . blood glucose meter kit and supplies KIT Dispense based on patient and insurance preference. Use up to four times daily as directed. (FOR ICD-9 250.00, 250.01).  . calcitRIOL (ROCALTROL) 0.25 MCG capsule Take 1 capsule by mouth once daily  . carvedilol (COREG) 25 MG tablet TAKE 1 TABLET BY MOUTH TWICE DAILY WITH A MEAL  . cholecalciferol (VITAMIN D) 1000 units tablet Take 1 tablet (1,000 Units total) by mouth daily.  . cholestyramine light (PREVALITE) 4 g packet Take 4 g by mouth daily.  . clopidogrel (PLAVIX) 75 MG tablet TAKE 1 TABLET BY MOUTH EVERY DAY  . folic acid (FOLVITE) 1 MG tablet Take 1 mg by mouth daily.  Marland Kitchen glucose blood test strip Use Contour test strips as instructed to check blood sugar four times daily.  . hydrALAZINE (APRESOLINE) 100 MG tablet TAKE 1 TABLET BY MOUTH TWICE A DAY  . insulin aspart (NOVOLOG FLEXPEN) 100 UNIT/ML FlexPen INJECT 10 UNITS SUBCUTANEOUSLY BEFORE THE FIRST MEAL OF THE DAY AND 12 UNITS AT DINNER  . Insulin Syringe-Needle U-100 (INSULIN SYRINGE 1CC/31GX5/16") 31G X 5/16" 1 ML MISC 1 each by Does not apply route 3 (three) times daily. Use insulin syringe to inject insulin three times daily.  . isosorbide mononitrate (IMDUR) 30 MG 24 hr tablet TAKE 2 TABLETS DAILY. PLEASE MAKE OVERDUE APPT WITH DR. Caryl Comes BEFORE ANYMORE REFILLS. 1ST  ATTEMPT  . Omega-3 Fatty Acids (FISH OIL) 1000 MG CAPS Take 1 capsule (1,000 mg total) by mouth every morning.  . pantoprazole (PROTONIX) 40 MG tablet TAKE 1 TABLET BY MOUTH EVERY DAY  . torsemide (DEMADEX) 20 MG tablet TAKE 2 TABS EVERY OTHER DAY. PLEASE MAKE OVERDUE APPT WITH DR. Caryl Comes BEFORE ANYMORE REFILLS. (Patient taking differently: TAKE 2 TABS EVERY OTHER DAY. PLEASE MAKE OVERDUE APPT WITH DR. Caryl Comes BEFORE ANYMORE REFILLS. Takes 2 tablets one day, then alternates with 1 tablet the next day)  . traMADol (ULTRAM) 50 MG tablet Take 0.5-1 tablets (25-50 mg total) by  mouth every 12 (twelve) hours as needed.  . TRESIBA FLEXTOUCH 200 UNIT/ML FlexTouch Pen INJECT 44 UNITS SUBCUTANEOUSLY ONCE DAILY  . VICTOZA 18 MG/3ML SOPN INJECT 0.3ML (1.8MG TOTAL) INTO THE SKIN DAILY BEFORE SUPPER  . vitamin B-12 (CYANOCOBALAMIN) 100 MCG tablet Take 100 mcg by mouth daily.    Allergies: Patient is allergic to ativan [lorazepam] and adhesive [tape]. Family History: Patient family history includes Cancer in his mother; Heart attack in his father; Stroke in his father. Social History:  Patient  reports that he quit smoking about 36 years ago. He has a 60.00 pack-year smoking history. He has never used smokeless tobacco. He reports current alcohol use of about 1.0 standard drink of alcohol per week. He reports that he does not use drugs.  Review of Systems: Constitutional: Negative for fever malaise or anorexia Cardiovascular: negative for chest pain Respiratory: negative for SOB or persistent cough Gastrointestinal: negative for abdominal pain  Objective  Vitals: BP 128/62   Pulse 68   Temp 98 F (36.7 C) (Temporal)   SpO2 97%  General: no acute distress , A&Ox3 Left EAC: plastic insert from hearing aid visualized and removed manually with alligator forceps intact after patient gave verbal consent for procedure. No complications. PT tolerated procedure well.  TM scarred with cerumen, no erythema. EAC nl.    Commons side effects, risks, benefits, and alternatives for medications and treatment plan prescribed today were discussed, and the patient expressed understanding of the given instructions. Patient is instructed to call or message via MyChart if he/she has any questions or concerns regarding our treatment plan. No barriers to understanding were identified. We discussed Red Flag symptoms and signs in detail. Patient expressed understanding regarding what to do in case of urgent or emergency type symptoms.   Medication list was reconciled, printed and provided to  the patient in AVS. Patient instructions and summary information was reviewed with the patient as documented in the AVS. This note was prepared with assistance of Dragon voice recognition software. Occasional wrong-word or sound-a-like substitutions may have occurred due to the inherent limitations of voice recognition software  This visit occurred during the SARS-CoV-2 public health emergency.  Safety protocols were in place, including screening questions prior to the visit, additional usage of staff PPE, and extensive cleaning of exam room while observing appropriate contact time as indicated for disinfecting solutions.

## 2020-04-08 NOTE — Patient Instructions (Signed)
Please follow up if symptoms do not improve or as needed.   

## 2020-04-11 ENCOUNTER — Other Ambulatory Visit: Payer: Self-pay | Admitting: Family Medicine

## 2020-04-13 ENCOUNTER — Ambulatory Visit: Payer: Self-pay

## 2020-04-13 ENCOUNTER — Other Ambulatory Visit: Payer: Self-pay

## 2020-04-13 ENCOUNTER — Ambulatory Visit (INDEPENDENT_AMBULATORY_CARE_PROVIDER_SITE_OTHER): Payer: Medicare Other | Admitting: Family Medicine

## 2020-04-13 DIAGNOSIS — N184 Chronic kidney disease, stage 4 (severe): Secondary | ICD-10-CM | POA: Diagnosis not present

## 2020-04-13 DIAGNOSIS — G8929 Other chronic pain: Secondary | ICD-10-CM

## 2020-04-13 DIAGNOSIS — I69311 Memory deficit following cerebral infarction: Secondary | ICD-10-CM | POA: Diagnosis not present

## 2020-04-13 DIAGNOSIS — M1712 Unilateral primary osteoarthritis, left knee: Secondary | ICD-10-CM

## 2020-04-13 DIAGNOSIS — M25562 Pain in left knee: Secondary | ICD-10-CM

## 2020-04-13 DIAGNOSIS — I69392 Facial weakness following cerebral infarction: Secondary | ICD-10-CM | POA: Diagnosis not present

## 2020-04-13 DIAGNOSIS — E1122 Type 2 diabetes mellitus with diabetic chronic kidney disease: Secondary | ICD-10-CM | POA: Diagnosis not present

## 2020-04-13 DIAGNOSIS — E1151 Type 2 diabetes mellitus with diabetic peripheral angiopathy without gangrene: Secondary | ICD-10-CM | POA: Diagnosis not present

## 2020-04-13 DIAGNOSIS — I421 Obstructive hypertrophic cardiomyopathy: Secondary | ICD-10-CM | POA: Diagnosis not present

## 2020-04-13 NOTE — Patient Instructions (Signed)
Thank you for coming in today.  Call or go to the ER if you develop a large red swollen joint with extreme pain or oozing puss.   If the knee does not improve enough let me know and we can get started on planning for the nerve ablation.

## 2020-04-13 NOTE — Progress Notes (Signed)
David [name] presents to clinic today for Orthovisc injection left knee 4/4  He is experiencing improvement with less pain and increased mobility with a combination of Orthovisc and physical therapy.  He would like to receive his fourth injection today.  Procedure: Real-time Ultrasound Guided Injection of left knee superior lateral patellar space Device: Philips Affiniti 50G Images permanently stored and available for review in PACS Verbal informed consent obtained.  Discussed risks and benefits of procedure. Warned about infection bleeding damage to structures skin hypopigmentation and fat atrophy among others. Patient expresses understanding and agreement Time-out conducted.   Noted no overlying erythema, induration, or other signs of local infection.   Skin prepped in a sterile fashion.   Local anesthesia: Topical Ethyl chloride.   With sterile technique and under real time ultrasound guidance:  Orthovisc injected into knee. Fluid seen entering the joint capsule.   Completed without difficulty    Advised to call if fevers/chills, erythema, induration, drainage, or persistent bleeding.   Images permanently stored and available for review in the ultrasound unit.  Impression: Technically successful ultrasound guided injection.    Lot #[id number]  Return as needed for knee pain.  Discussed that genicular nerve ablation would be next step and they can certainly ask for referral to that if needed in the future.

## 2020-04-14 ENCOUNTER — Other Ambulatory Visit (HOSPITAL_COMMUNITY)
Admission: RE | Admit: 2020-04-14 | Discharge: 2020-04-14 | Disposition: A | Discharge status: Home / Self Care | Payer: Medicare Other | Source: Ambulatory Visit | Attending: Vascular Surgery | Admitting: Vascular Surgery

## 2020-04-14 DIAGNOSIS — E1122 Type 2 diabetes mellitus with diabetic chronic kidney disease: Secondary | ICD-10-CM | POA: Diagnosis not present

## 2020-04-14 DIAGNOSIS — I69311 Memory deficit following cerebral infarction: Secondary | ICD-10-CM | POA: Diagnosis not present

## 2020-04-14 DIAGNOSIS — Z20822 Contact with and (suspected) exposure to covid-19: Secondary | ICD-10-CM | POA: Diagnosis not present

## 2020-04-14 DIAGNOSIS — I69392 Facial weakness following cerebral infarction: Secondary | ICD-10-CM | POA: Diagnosis not present

## 2020-04-14 DIAGNOSIS — Z01812 Encounter for preprocedural laboratory examination: Secondary | ICD-10-CM | POA: Diagnosis not present

## 2020-04-14 DIAGNOSIS — E1151 Type 2 diabetes mellitus with diabetic peripheral angiopathy without gangrene: Secondary | ICD-10-CM | POA: Diagnosis not present

## 2020-04-14 DIAGNOSIS — N184 Chronic kidney disease, stage 4 (severe): Secondary | ICD-10-CM | POA: Diagnosis not present

## 2020-04-14 DIAGNOSIS — I421 Obstructive hypertrophic cardiomyopathy: Secondary | ICD-10-CM | POA: Diagnosis not present

## 2020-04-14 LAB — SARS CORONAVIRUS 2 (TAT 6-24 HRS): SARS Coronavirus 2: NEGATIVE

## 2020-04-15 ENCOUNTER — Other Ambulatory Visit: Payer: Self-pay

## 2020-04-15 ENCOUNTER — Encounter: Payer: Self-pay | Admitting: Internal Medicine

## 2020-04-15 ENCOUNTER — Encounter (HOSPITAL_COMMUNITY): Payer: Self-pay | Admitting: *Deleted

## 2020-04-15 ENCOUNTER — Ambulatory Visit (HOSPITAL_COMMUNITY)
Admission: RE | Admit: 2020-04-15 | Discharge: 2020-04-15 | Disposition: A | Discharge status: Home / Self Care | Payer: Medicare Other | Source: Ambulatory Visit | Attending: Nephrology | Admitting: Nephrology

## 2020-04-15 VITALS — BP 104/43 | HR 63 | Temp 97.6°F | Resp 20

## 2020-04-15 DIAGNOSIS — N184 Chronic kidney disease, stage 4 (severe): Secondary | ICD-10-CM | POA: Diagnosis not present

## 2020-04-15 LAB — RENAL FUNCTION PANEL
Albumin: 3 g/dL — ABNORMAL LOW (ref 3.5–5.0)
Anion gap: 12 (ref 5–15)
BUN: 61 mg/dL — ABNORMAL HIGH (ref 8–23)
CO2: 18 mmol/L — ABNORMAL LOW (ref 22–32)
Calcium: 9 mg/dL (ref 8.9–10.3)
Chloride: 112 mmol/L — ABNORMAL HIGH (ref 98–111)
Creatinine, Ser: 3.04 mg/dL — ABNORMAL HIGH (ref 0.61–1.24)
GFR, Estimated: 20 mL/min — ABNORMAL LOW (ref >60)
Glucose, Bld: 89 mg/dL (ref 70–99)
Phosphorus: 3.9 mg/dL (ref 2.5–4.6)
Potassium: 3.8 mmol/L (ref 3.5–5.1)
Sodium: 142 mmol/L (ref 135–145)

## 2020-04-15 LAB — POCT HEMOGLOBIN-HEMACUE: Hemoglobin: 9.1 g/dL — ABNORMAL LOW (ref 13.0–17.0)

## 2020-04-15 MED ORDER — DEXTROSE 5 % IV SOLN
3.0000 g | INTRAVENOUS | Status: DC
  Filled 2020-04-15: qty 3000

## 2020-04-15 MED ORDER — DARBEPOETIN ALFA 60 MCG/0.3ML IJ SOSY
PREFILLED_SYRINGE | INTRAMUSCULAR | Status: AC
  Filled 2020-04-15: qty 0.3

## 2020-04-15 MED ORDER — DARBEPOETIN ALFA 60 MCG/0.3ML IJ SOSY
60.0000 ug | PREFILLED_SYRINGE | INTRAMUSCULAR | Status: DC
  Administered 2020-04-15: 60 ug via SUBCUTANEOUS

## 2020-04-15 NOTE — Progress Notes (Addendum)
Spoke with wife [name].  [name] states patient does not have any shortness of breath, fever, cough or chest pain.  PCP - Dr Dimas Chyle Cardiologist - Dr Fransico Him, Dr Virl Axe Endocrinology - Dr Elayne Snare Neurology - Frann Rider, NP [facility] - Dr Marval Regal  Chest x-ray - n/a EKG - DOS 04/16/20 Stress Test - n/a ECHO - 01/07/19 Cardiac Cath - 11/25/10  ICD Pacemaker - Medtronic Advisa Pacemaker. IB message sent for cardiac device programming.  Last remote check on 12/03/19. Emailed [employer] Reps and [place], Therapist, sports.  Sleep Study -  Yes CPAP - uses cpap  Fasting Blood Sugar - 90-110s Checks Blood Sugar 2-3 times a day  . THE NIGHT BEFORE SURGERY, take 36 units of Tresiba Insulin.      . THE MORNING OF SURGERY, do not take Novolog Insulin unless your CBG is greater than 220 mg/dL, then you may take  of your sliding scale (correction) dose.  . If your blood sugar is less than 70 mg/dL, you will need to treat for low blood sugar: o Treat a low blood sugar (less than 70 mg/dL) with  cup of clear juice (cranberry or apple), 4 glucose tablets, OR glucose gel. o Recheck blood sugar in 15 minutes after treatment (to make sure it is greater than 70 mg/dL). If your blood sugar is not greater than 70 mg/dL on recheck, call [phone] for further instructions.  Blood Thinner Instructions:  Last dose of plavix was on 04/08/20.  Anesthesia review: Yes  STOP now taking any Aspirin (unless otherwise instructed by your surgeon), Aleve, Naproxen, Ibuprofen, Motrin, Advil, Goody's, BC's, all herbal medications, fish oil, and all vitamins.   Coronavirus Screening Covid test on 04/14/20 was negative.  Wife [name] verbalized understanding of instructions that were given via phone.

## 2020-04-15 NOTE — Progress Notes (Signed)
PERIOPERATIVE PRESCRIPTION FOR IMPLANTED CARDIAC DEVICE PROGRAMMING   Patient Information: Name: David [name], David [name]  DOB: Jun 08, 1940  MRN: [id number]   Cc: Clayborn Bigness, RN Planned Procedure: AV Fistula Creation Left Upper Extremity  Surgeon: Dr Jamelle Haring  Date of Procedure: 04/16/20  Cautery will be used.  Position during surgery: supine   Please send documentation back to:  [facility] (Fax # [phone])   Marveen Reeks, RN  04/15/2020 9:03 AM     Device Information:   Clinic EP Physician:   Virl Axe, MD Device Type:  Pacemaker Manufacturer and Phone #:  Medtronic: [id number] Pacemaker Dependent?:  unknown Date of Last Device Check:12/03/2019 remote only       Normal Device Function?:  unknown   Electrophysiologist's Recommendations:   [employer] Industry rep. Will need to be present to check patient prior to procedure for recommendations due to patient being overdue for clinic follow-up with Dr Caryl Comes.  Per Device Clinic Standing Orders, Drake Leach  04/15/2020 4:45 PM

## 2020-04-15 NOTE — Anesthesia Preprocedure Evaluation (Addendum)
Anesthesia Evaluation  Patient identified by MRN, date of birth, ID band Patient awake    Reviewed: Allergy & Precautions, NPO status , Patient's Chart, lab work & pertinent test results, reviewed documented beta blocker date and time   History of Anesthesia Complications Negative for: history of anesthetic complications  Airway Mallampati: II  TM Distance: >3 FB Neck ROM: Full    Dental  (+) Upper Dentures, Lower Dentures   Pulmonary sleep apnea and Continuous Positive Airway Pressure Ventilation , former smoker,    Pulmonary exam normal        Cardiovascular hypertension, Pt. on medications and Pt. on home beta blockers + CAD, + Peripheral Vascular Disease and +CHF  Normal cardiovascular exam+ pacemaker    '20 TTE - EF 55-60%. Doppler parameters are consistent with impaired relaxation. The interatrial septum appears to be lipomatous.    Neuro/Psych PSYCHIATRIC DISORDERS Anxiety Depression CVA    GI/Hepatic Neg liver ROS, GERD  Controlled,  Endo/Other  diabetes, Type 2, Insulin Dependent Obesity Insulin pump   Renal/GU ESRFRenal disease     Musculoskeletal  (+) Arthritis ,   Abdominal   Peds  Hematology  (+) anemia ,  On plavix, last dose one week ago    Anesthesia Other Findings See PAT note Covid test negative   Reproductive/Obstetrics                           Anesthesia Physical Anesthesia Plan  ASA: IV  Anesthesia Plan: Regional   Post-op Pain Management:    Induction: Intravenous  PONV Risk Score and Plan: 1 and Propofol infusion and Treatment may vary due to age or medical condition  Airway Management Planned: Natural Airway and Simple Face Mask  Additional Equipment: None  Intra-op Plan:   Post-operative Plan:   Informed Consent: I have reviewed the patients History and Physical, chart, labs and discussed the procedure including the risks, benefits and  alternatives for the proposed anesthesia with the patient or authorized representative who has indicated his/her understanding and acceptance.       Plan Discussed with: CRNA and Anesthesiologist  Anesthesia Plan Comments:       Anesthesia Quick Evaluation

## 2020-04-15 NOTE — Progress Notes (Signed)
Anesthesia Chart Review: Same day workup  IDDM 2 followed by endocrinologist Dr. Dwyane Dee.  Has insulin pump in place.  Last A1c 6.5 on 03/30/2020.  However Dr. Dwyane Dee did note that this may be artificially low due to patient's anemia.  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  OSA on CPAP, followed by cardiologist Dr. Radford Pax.  Last seen 01/17/2020, tolerating therapy well, good compliance.  History of HTN, HOCM, SSS s/p Medtronic dual-chamber PPM originally implanted 2009 - generator change 2017, paroxysmal A. fib, nonobstructive CAD.  Cardiology notes state he had a cardiac catheterization in 2012 in [place] that showed nonobstructive disease.  He is followed by EP cardiology as well as general cardiology.  Hypertension is managed by his nephrologist Dr. Marval Regal.  Last remote device check 12/03/2019 was normal.  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  Last creatinine on 03/30/2020 was 2.75.  Anemia, last hemoglobin 9.4 on 03/18/2020.  Will need day of surgery labs and evaluation  EKG 01/09/2019: Demand pacemaker; interpretation is based on intrinsic rhythm. Wide QRS tachycardia with Premature ventricular complexes or Fusion complexes. Left axis deviation. Non-specific intra-ventricular conduction block.Reubin Milan rule out Anteroseptal infarct , age undetermined. No significant change since last tracing  TTE 01/07/2019: 1. The left ventricle has normal systolic function, with an ejection  fraction of 55-60%. The cavity size was normal. Left ventricular diastolic  Doppler parameters are consistent with impaired relaxation. Elevated left  ventricular end-diastolic pressure.  2. The right ventricle has normal systolic function. The cavity was  normal. There is no increase in right ventricular wall thickness. Right  ventricular systolic pressure could not be assessed.  3. The aortic valve is tricuspid. Severe  calcifcation of the non coronary  cusp.  4. The aorta is normal unless otherwise noted.  5. The interatrial septum appears to be lipomatous.  6. The inferior vena cava was dilated in size with >50% respiratory  variability.    Wynonia Musty [facility]/Anesthesiology Phone [phone] 04/15/2020 10:07 AM

## 2020-04-16 ENCOUNTER — Ambulatory Visit (HOSPITAL_COMMUNITY)
Admission: RE | Admit: 2020-04-16 | Discharge: 2020-04-16 | Disposition: A | Discharge status: Home / Self Care | Payer: Medicare Other | Attending: Vascular Surgery | Admitting: Vascular Surgery

## 2020-04-16 ENCOUNTER — Encounter (HOSPITAL_COMMUNITY): Payer: Self-pay

## 2020-04-16 ENCOUNTER — Encounter (HOSPITAL_COMMUNITY)
Admission: RE | Disposition: A | Discharge status: Home / Self Care | Payer: Self-pay | Source: Home / Self Care | Attending: Vascular Surgery

## 2020-04-16 ENCOUNTER — Ambulatory Visit (HOSPITAL_COMMUNITY): Payer: Medicare Other | Admitting: Physician Assistant

## 2020-04-16 ENCOUNTER — Other Ambulatory Visit: Payer: Self-pay

## 2020-04-16 DIAGNOSIS — N185 Chronic kidney disease, stage 5: Secondary | ICD-10-CM | POA: Insufficient documentation

## 2020-04-16 DIAGNOSIS — Z950 Presence of cardiac pacemaker: Secondary | ICD-10-CM | POA: Insufficient documentation

## 2020-04-16 DIAGNOSIS — I509 Heart failure, unspecified: Secondary | ICD-10-CM | POA: Diagnosis not present

## 2020-04-16 DIAGNOSIS — Z794 Long term (current) use of insulin: Secondary | ICD-10-CM | POA: Insufficient documentation

## 2020-04-16 DIAGNOSIS — E1122 Type 2 diabetes mellitus with diabetic chronic kidney disease: Secondary | ICD-10-CM | POA: Diagnosis not present

## 2020-04-16 DIAGNOSIS — Z79899 Other long term (current) drug therapy: Secondary | ICD-10-CM | POA: Diagnosis not present

## 2020-04-16 DIAGNOSIS — Z87891 Personal history of nicotine dependence: Secondary | ICD-10-CM | POA: Insufficient documentation

## 2020-04-16 DIAGNOSIS — Z7902 Long term (current) use of antithrombotics/antiplatelets: Secondary | ICD-10-CM | POA: Insufficient documentation

## 2020-04-16 DIAGNOSIS — I132 Hypertensive heart and chronic kidney disease with heart failure and with stage 5 chronic kidney disease, or end stage renal disease: Secondary | ICD-10-CM | POA: Insufficient documentation

## 2020-04-16 HISTORY — DX: Other amnesia: R41.3

## 2020-04-16 HISTORY — DX: Dependence on other enabling machines and devices: Z99.89

## 2020-04-16 HISTORY — DX: Depression, unspecified: F32.A

## 2020-04-16 HISTORY — PX: AV FISTULA PLACEMENT: SHX1204

## 2020-04-16 HISTORY — DX: Unspecified hearing loss, unspecified ear: H91.90

## 2020-04-16 LAB — POCT I-STAT, CHEM 8
BUN: 59 mg/dL — ABNORMAL HIGH (ref 8–23)
Calcium, Ion: 1.24 mmol/L (ref 1.15–1.40)
Chloride: 110 mmol/L (ref 98–111)
Creatinine, Ser: 2.9 mg/dL — ABNORMAL HIGH (ref 0.61–1.24)
Glucose, Bld: 104 mg/dL — ABNORMAL HIGH (ref 70–99)
HCT: 29 % — ABNORMAL LOW (ref 39.0–52.0)
Hemoglobin: 9.9 g/dL — ABNORMAL LOW (ref 13.0–17.0)
Potassium: 4.1 mmol/L (ref 3.5–5.1)
Sodium: 143 mmol/L (ref 135–145)
TCO2: 20 mmol/L — ABNORMAL LOW (ref 22–32)

## 2020-04-16 LAB — GLUCOSE, CAPILLARY: Glucose-Capillary: 105 mg/dL — ABNORMAL HIGH (ref 70–99)

## 2020-04-16 SURGERY — ARTERIOVENOUS (AV) FISTULA CREATION
Anesthesia: Regional | Site: Arm Upper | Laterality: Left

## 2020-04-16 MED ORDER — PHENYLEPHRINE HCL-NACL 10-0.9 MG/250ML-% IV SOLN
INTRAVENOUS | Status: AC
  Filled 2020-04-16: qty 500

## 2020-04-16 MED ORDER — MIDAZOLAM HCL 2 MG/2ML IJ SOLN
INTRAMUSCULAR | Status: AC
  Filled 2020-04-16: qty 2

## 2020-04-16 MED ORDER — PROPOFOL 500 MG/50ML IV EMUL
INTRAVENOUS | Status: DC | PRN
  Administered 2020-04-16: 100 ug/kg/min via INTRAVENOUS

## 2020-04-16 MED ORDER — ORAL CARE MOUTH RINSE: 15.0000 mL | Freq: Once | OROMUCOSAL | Status: AC

## 2020-04-16 MED ORDER — FENTANYL CITRATE (PF) 100 MCG/2ML IJ SOLN: 25.0000 ug | INTRAMUSCULAR | Status: DC | PRN

## 2020-04-16 MED ORDER — PROPOFOL 10 MG/ML IV BOLUS
INTRAVENOUS | Status: DC | PRN
  Administered 2020-04-16: 20 mg via INTRAVENOUS

## 2020-04-16 MED ORDER — OXYCODONE HCL 5 MG PO TABS: 5.0000 mg | ORAL_TABLET | Freq: Once | ORAL | Status: DC | PRN

## 2020-04-16 MED ORDER — HEPARIN SODIUM (PORCINE) 1000 UNIT/ML IJ SOLN
INTRAMUSCULAR | Status: DC | PRN
  Administered 2020-04-16: 7000 [IU] via INTRAVENOUS

## 2020-04-16 MED ORDER — LIDOCAINE-EPINEPHRINE (PF) 1.5 %-1:200000 IJ SOLN
INTRAMUSCULAR | Status: DC | PRN
  Administered 2020-04-16: 25 mL via PERINEURAL

## 2020-04-16 MED ORDER — CHLORHEXIDINE GLUCONATE 0.12 % MT SOLN
OROMUCOSAL | Status: AC
  Administered 2020-04-16: 15 mL via OROMUCOSAL
  Filled 2020-04-16: qty 15

## 2020-04-16 MED ORDER — PHENYLEPHRINE HCL (PRESSORS) 10 MG/ML IV SOLN
INTRAVENOUS | Status: DC | PRN
  Administered 2020-04-16: 80 ug via INTRAVENOUS

## 2020-04-16 MED ORDER — CHLORHEXIDINE GLUCONATE 4 % EX LIQD: 60.0000 mL | Freq: Once | CUTANEOUS | Status: DC

## 2020-04-16 MED ORDER — PROPOFOL 1000 MG/100ML IV EMUL
INTRAVENOUS | Status: AC
  Filled 2020-04-16: qty 100

## 2020-04-16 MED ORDER — PHENYLEPHRINE 40 MCG/ML (10ML) SYRINGE FOR IV PUSH (FOR BLOOD PRESSURE SUPPORT)
PREFILLED_SYRINGE | INTRAVENOUS | Status: AC
  Filled 2020-04-16: qty 10

## 2020-04-16 MED ORDER — FENTANYL CITRATE (PF) 250 MCG/5ML IJ SOLN
INTRAMUSCULAR | Status: AC
  Filled 2020-04-16: qty 5

## 2020-04-16 MED ORDER — DEXTROSE 5 % IV SOLN
INTRAVENOUS | Status: DC | PRN
  Administered 2020-04-16: 3 g via INTRAVENOUS

## 2020-04-16 MED ORDER — LACTATED RINGERS IV SOLN: INTRAVENOUS | Status: DC

## 2020-04-16 MED ORDER — SODIUM CHLORIDE 0.9 % IV SOLN
INTRAVENOUS | Status: AC
  Filled 2020-04-16: qty 1.2

## 2020-04-16 MED ORDER — PAPAVERINE HCL 30 MG/ML IJ SOLN
INTRAMUSCULAR | Status: AC
  Filled 2020-04-16: qty 2

## 2020-04-16 MED ORDER — CHLORHEXIDINE GLUCONATE 0.12 % MT SOLN: 15.0000 mL | Freq: Once | OROMUCOSAL | Status: AC

## 2020-04-16 MED ORDER — 0.9 % SODIUM CHLORIDE (POUR BTL) OPTIME
TOPICAL | Status: DC | PRN
  Administered 2020-04-16: 1000 mL

## 2020-04-16 MED ORDER — TRAMADOL HCL 50 MG PO TABS
50.0000 mg | ORAL_TABLET | Freq: Two times a day (BID) | ORAL | 0 refills | Status: DC | PRN
Start: 2020-04-16 — End: 2020-07-23

## 2020-04-16 MED ORDER — LIDOCAINE-EPINEPHRINE (PF) 1 %-1:200000 IJ SOLN
INTRAMUSCULAR | Status: AC
  Filled 2020-04-16: qty 30

## 2020-04-16 MED ORDER — FENTANYL CITRATE (PF) 100 MCG/2ML IJ SOLN
INTRAMUSCULAR | Status: DC | PRN
  Administered 2020-04-16: 50 ug via INTRAVENOUS

## 2020-04-16 MED ORDER — LIDOCAINE HCL (CARDIAC) PF 100 MG/5ML IV SOSY
PREFILLED_SYRINGE | INTRAVENOUS | Status: DC | PRN
  Administered 2020-04-16: 60 mg via INTRAVENOUS

## 2020-04-16 MED ORDER — ONDANSETRON HCL 4 MG/2ML IJ SOLN: 4.0000 mg | Freq: Once | INTRAMUSCULAR | Status: DC | PRN

## 2020-04-16 MED ORDER — PHENYLEPHRINE HCL-NACL 10-0.9 MG/250ML-% IV SOLN
INTRAVENOUS | Status: DC | PRN
  Administered 2020-04-16: 25 ug/min via INTRAVENOUS

## 2020-04-16 MED ORDER — PROPOFOL 10 MG/ML IV BOLUS
INTRAVENOUS | Status: AC
  Filled 2020-04-16: qty 20

## 2020-04-16 MED ORDER — OXYCODONE HCL 5 MG/5ML PO SOLN: 5.0000 mg | Freq: Once | ORAL | Status: DC | PRN

## 2020-04-16 MED ORDER — SODIUM CHLORIDE 0.9 % IV SOLN: INTRAVENOUS | Status: DC

## 2020-04-16 MED ORDER — ONDANSETRON HCL 4 MG/2ML IJ SOLN
INTRAMUSCULAR | Status: DC | PRN
  Administered 2020-04-16: 4 mg via INTRAVENOUS

## 2020-04-16 MED ORDER — ONDANSETRON HCL 4 MG/2ML IJ SOLN
INTRAMUSCULAR | Status: AC
  Filled 2020-04-16: qty 2

## 2020-04-16 MED ORDER — SODIUM CHLORIDE 0.9 % IV SOLN
INTRAVENOUS | Status: DC | PRN
  Administered 2020-04-16: 500 mL

## 2020-04-16 SURGICAL SUPPLY — 41 items
ADH SKN CLS APL DERMABOND .7 (GAUZE/BANDAGES/DRESSINGS) ×1
APL PRP STRL LF DISP 70% ISPRP (MISCELLANEOUS) ×1
APL SKNCLS STERI-STRIP NONHPOA (GAUZE/BANDAGES/DRESSINGS) ×1
ARMBAND PINK RESTRICT EXTREMIT (MISCELLANEOUS) ×2 IMPLANT
BENZOIN TINCTURE PRP APPL 2/3 (GAUZE/BANDAGES/DRESSINGS) ×2 IMPLANT
CANISTER SUCT 3000ML PPV (MISCELLANEOUS) ×2 IMPLANT
CANNULA VESSEL 3MM 2 BLNT TIP (CANNULA) ×2 IMPLANT
CHLORAPREP W/TINT 26 (MISCELLANEOUS) ×2 IMPLANT
CLIP VESOCCLUDE MED 6/CT (CLIP) ×2 IMPLANT
CLIP VESOCCLUDE SM WIDE 6/CT (CLIP) ×4 IMPLANT
COVER PROBE W GEL 5X96 (DRAPES) IMPLANT
COVER WAND RF STERILE (DRAPES) ×2 IMPLANT
DERMABOND ADVANCED (GAUZE/BANDAGES/DRESSINGS) ×1
DERMABOND ADVANCED .7 DNX12 (GAUZE/BANDAGES/DRESSINGS) ×1 IMPLANT
DRAPE EXTREMITY T 121X128X90 (DISPOSABLE) ×2 IMPLANT
ELECT REM PT RETURN 9FT ADLT (ELECTROSURGICAL) ×2
ELECTRODE REM PT RTRN 9FT ADLT (ELECTROSURGICAL) ×1 IMPLANT
GLOVE BIO SURGEON STRL SZ 6.5 (GLOVE) ×2 IMPLANT
GLOVE BIO SURGEON STRL SZ7 (GLOVE) ×2 IMPLANT
GLOVE BIO SURGEON STRL SZ7.5 (GLOVE) ×2 IMPLANT
GLOVE ECLIPSE 6.5 STRL STRAW (GLOVE) ×2 IMPLANT
GLOVE SURG SS PI 8.0 STRL IVOR (GLOVE) ×2 IMPLANT
GOWN STRL REUS W/ TWL LRG LVL3 (GOWN DISPOSABLE) ×2 IMPLANT
GOWN STRL REUS W/ TWL XL LVL3 (GOWN DISPOSABLE) ×2 IMPLANT
GOWN STRL REUS W/TWL LRG LVL3 (GOWN DISPOSABLE) ×4
GOWN STRL REUS W/TWL XL LVL3 (GOWN DISPOSABLE) ×4
INSERT FOGARTY SM (MISCELLANEOUS) IMPLANT
KIT BASIN OR (CUSTOM PROCEDURE TRAY) ×2 IMPLANT
KIT TURNOVER KIT B (KITS) ×2 IMPLANT
NS IRRIG 1000ML POUR BTL (IV SOLUTION) ×2 IMPLANT
PACK CV ACCESS (CUSTOM PROCEDURE TRAY) ×2 IMPLANT
PAD ARMBOARD 7.5X6 YLW CONV (MISCELLANEOUS) ×4 IMPLANT
PENCIL SMOKE EVACUATOR (MISCELLANEOUS) ×2 IMPLANT
STRIP CLOSURE SKIN 1/2X4 (GAUZE/BANDAGES/DRESSINGS) ×2 IMPLANT
SUT MNCRL AB 4-0 PS2 18 (SUTURE) ×2 IMPLANT
SUT PROLENE 6 0 BV (SUTURE) ×4 IMPLANT
SUT VIC AB 3-0 SH 27 (SUTURE) ×2
SUT VIC AB 3-0 SH 27X BRD (SUTURE) ×1 IMPLANT
TOWEL GREEN STERILE (TOWEL DISPOSABLE) ×2 IMPLANT
UNDERPAD 30X36 HEAVY ABSORB (UNDERPADS AND DIAPERS) ×2 IMPLANT
WATER STERILE IRR 1000ML POUR (IV SOLUTION) ×2 IMPLANT

## 2020-04-16 NOTE — Anesthesia Procedure Notes (Signed)
Anesthesia Regional Block: Supraclavicular block   Pre-Anesthetic Checklist: ,, timeout performed, Correct Patient, Correct Site, Correct Laterality, Correct Procedure, Correct Position, site marked, Risks and benefits discussed,  Surgical consent,  Pre-op evaluation,  At surgeon's request and post-op pain management  Laterality: Left  Prep: chloraprep       Needles:  Injection technique: Single-shot  Needle Type: Echogenic Needle     Needle Length: 5cm  Needle Gauge: 21     Additional Needles:   Narrative:  Start time: 04/16/2020 7:10 AM End time: 04/16/2020 7:13 AM Injection made incrementally with aspirations every 5 mL.  Performed by: Personally  Anesthesiologist: Audry Pili, MD  Additional Notes: No pain on injection. No increased resistance to injection. Injection made in 5cc increments. Good needle visualization. Patient tolerated the procedure well.

## 2020-04-16 NOTE — Discharge Instructions (Signed)
° °  [facility] [place] ° °Discharge Instructions ° °AV Fistula or Graft Surgery for Dialysis Access ° °Please refer to the following instructions for your post-procedure care. Your surgeon or physician assistant will discuss any changes with you. ° °Activity ° °You may drive the day following your surgery, if you are comfortable and no longer taking prescription pain medication. Resume full activity as the soreness in your incision resolves. ° °Bathing/Showering ° °You may shower after you go home. Keep your incision dry for 48 hours. Do not soak in a bathtub, hot tub, or swim until the incision heals completely. You may not shower if you have a hemodialysis catheter. ° °Incision Care ° °Clean your incision with mild soap and water after 48 hours. Pat the area dry with a clean towel. You do not need a bandage unless otherwise instructed. Do not apply any ointments or creams to your incision. You may have skin glue on your incision. Do not peel it off. It will come off on its own in about one week. Your arm may swell a bit after surgery. To reduce swelling use pillows to elevate your arm so it is above your heart. Your doctor will tell you if you need to lightly wrap your arm with an ACE bandage. ° °Diet ° °Resume your normal diet. There are not special food restrictions following this procedure. In order to heal from your surgery, it is CRITICAL to get adequate nutrition. Your body requires vitamins, minerals, and protein. Vegetables are the best source of vitamins and minerals. Vegetables also provide the perfect balance of protein. Processed food has little nutritional value, so try to avoid this. ° °Medications ° °Resume taking all of your medications. If your incision is causing pain, you may take over-the counter pain relievers such as acetaminophen (Tylenol). If you were prescribed a stronger pain medication, please be aware these medications can cause nausea and constipation. Prevent  nausea by taking the medication with a snack or meal. Avoid constipation by drinking plenty of fluids and eating foods with high amount of fiber, such as fruits, vegetables, and grains. Do not take Tylenol if you are taking prescription pain medications. ° ° ° ° °Follow up °Your surgeon may want to see you in the office following your access surgery. If so, this will be arranged at the time of your surgery. ° °Please call us immediately for any of the following conditions: ° °Increased pain, redness, drainage (pus) from your incision site °Fever of 101 degrees or higher °Severe or worsening pain at your incision site °Hand pain or numbness. ° °Reduce your risk of vascular disease: ° °Stop smoking. If you would like help, call [facility] at 1-800-QUIT-NOW (1-800-784-8669) or [place] at 336-586-4000 ° °Manage your cholesterol °Maintain a desired weight °Control your diabetes °Keep your blood pressure down ° °Dialysis ° °It will take several weeks to several months for your new dialysis access to be ready for use. Your surgeon will determine when it is OK to use it. Your nephrologist will continue to direct your dialysis. You can continue to use your Permcath until your new access is ready for use. ° °If you have any questions, please call the office at [phone]. ° °

## 2020-04-16 NOTE — Progress Notes (Signed)
Orthopedic Tech Progress Note Patient Details:  David [name] 04-Nov-1940 [id number] Left sling with PACU RN Patient ID: David [name], male   DOB: 1940-09-15, 79 y.o.   MRN: [id number]   David [name] 04/16/2020, 12:32 PM

## 2020-04-16 NOTE — Transfer of Care (Signed)
Immediate Anesthesia Transfer of Care Note  Patient: David [name]  Procedure(s) Performed: CREATION OF LEFT Brachial/Cephalic Arteriovenous Fistula. (Left Arm Upper)  Patient Location: PACU  Anesthesia Type:MAC combined with regional for post-op pain  Level of Consciousness: awake, alert  and oriented  Airway & Oxygen Therapy: Patient Spontanous Breathing  Post-op Assessment: Report given to RN and Post -op Vital signs reviewed and stable  Post vital signs: Reviewed and stable  Last Vitals:  Vitals Value Taken Time  BP 133/36 04/16/20 0917  Temp    Pulse 60 04/16/20 0919  Resp 16 04/16/20 0919  SpO2 96 % 04/16/20 0919  Vitals shown include unvalidated device data.  Last Pain:  Vitals:   04/16/20 0631  TempSrc:   PainSc: 0-No pain         Complications: No complications documented.

## 2020-04-16 NOTE — Interval H&P Note (Signed)
History and Physical Interval Note:  04/16/2020 7:22 AM  [name]  has presented today for surgery, with the diagnosis of CHRONIC KIDNEY DISEASE STAGE V.  The various methods of treatment have been discussed with the patient and family. After consideration of risks, benefits and other options for treatment, the patient has consented to  Procedure(s): ARTERIOVENOUS (AV) FISTULA CREATION LEFT UPPER EXTREMITY (Left) as a surgical intervention.  The patient's history has been reviewed, patient examined, no change in status, stable for surgery.  I have reviewed the patient's chart and labs.  Questions were answered to the patient's satisfaction.     Cherre Robins

## 2020-04-16 NOTE — Anesthesia Postprocedure Evaluation (Signed)
Anesthesia Post Note  Patient: David [name]  Procedure(s) Performed: CREATION OF LEFT Brachial/Cephalic Arteriovenous Fistula. (Left Arm Upper)     Patient location during evaluation: PACU Anesthesia Type: Regional Level of consciousness: awake and alert Pain management: pain level controlled Vital Signs Assessment: post-procedure vital signs reviewed and stable Respiratory status: spontaneous breathing, nonlabored ventilation and respiratory function stable Cardiovascular status: stable and blood pressure returned to baseline Anesthetic complications: no   No complications documented.  Last Vitals:  Vitals:   04/16/20 1003 04/16/20 1018  BP: (!) 137/41 109/85  Pulse: (!) 59 (!) 59  Resp: 16 15  Temp:  36.7 C  SpO2: 94% 98%    Last Pain:  Vitals:   04/16/20 1018  TempSrc:   PainSc: 0-No pain                 Audry Pili

## 2020-04-16 NOTE — Anesthesia Procedure Notes (Signed)
Procedure Name: MAC Date/Time: 04/16/2020 7:42 AM Performed by: Inda Coke, CRNA Pre-anesthesia Checklist: Patient identified, Emergency Drugs available, Suction available, Timeout performed and Patient being monitored Patient Re-evaluated:Patient Re-evaluated prior to induction Oxygen Delivery Method: Simple face mask Induction Type: IV induction Dental Injury: Teeth and Oropharynx as per pre-operative assessment

## 2020-04-16 NOTE — Op Note (Signed)
DATE OF SERVICE: 04/16/2020  PATIENT:  David [name]  79 y.o. male  PRE-OPERATIVE DIAGNOSIS:  CKD approaching ESRD  POST-OPERATIVE DIAGNOSIS:  same  PROCEDURE:  Procedure(s): CREATION OF LEFT Brachial/Cephalic Arteriovenous Fistula. (Left)  SURGEON:  Surgeon(s) and Role:    * Cherre Robins, MD - Primary  ASSISTANT: Arlee Muslim, PA-C  An assistant was required to facilitate exposure and expedite the case.  ANESTHESIA:   regional and MAC  EBL: min  BLOOD ADMINISTERED:none  DRAINS: none   LOCAL MEDICATIONS USED:  LIDOCAINE   SPECIMEN:  none  COUNTS: confirmed correct.  TOURNIQUET:  * No tourniquets in log *  PATIENT DISPOSITION:  PACU - hemodynamically stable.   Delay start of Pharmacological VTE agent (>24hrs) due to surgical blood loss or risk of bleeding: no  INDICATION FOR PROCEDURE: David [name] is a 79 y.o. male with CKDIV approaching ESRD. After careful discussion of risks, benefits, and alternatives the patient was offered creation of AVF. We specifically discussed risks of steal syndrome. The patient understood and wished to proceed.  OPERATIVE FINDINGS: healthy cephalic / antecubital veins; brachial artery with mild atherosclerosis, but excellent caliber. Palpable thrill on completion.  DESCRIPTION OF PROCEDURE: After identification of the patient in the pre-operative holding area, the patient was transferred to the operating room. The patient was positioned supine on the operating room table. Anesthesia was induced. The left arm was prepped and draped in standard fashion. A surgical pause was performed confirming correct patient, procedure, and operative location.  Using intraoperative ultrasound, the course of the left upper extremity superficial veins was mapped.  The cephalic vein appeared adequate for arteriovenous fistula creation.  The brachial artery was similarly mapped.  The artery appeared adequate for arterial venous creation.  A transverse  incision was made in the arm just distal to the antecubital crease.  Incision was carried down through subcutaneous tissue until the cephalic vein was identified and skeletonized.  We continued our exposure through the aponeurosis of the biceps.  The brachial artery was encountered its usual position.  The artery was circumferentially exposed and encircled with Silastic Vesseloops.  Patient was systemically heparinized.  The distal cephalic vein was transected.  The distal stump of the cephalic vein was oversewn with a 2-0 silk suture.  The proximal vein was controlled with a bulldog clamp.  The brachial artery was clamped proximally medially.  An anterior arteriotomy was made with a 11 blade.  The arteriotomy was extended with [name] scissors.  Using a parachute technique the cephalic vein was anastomosed to the brachial arteriotomy in end-to-side fashion with continuous running suture of 6-0 Prolene.  Immediately prior to completion the anastomosis was flushed and de-aired.  The anastomosis was completed.  Hemostasis was assured.  The fistula was interrogated with Doppler.  Audible bruit was heard throughout the course of the cephalic vein.  A biphasic radial artery signal was heard which augmented slightly with compression of the fistula.  The wound was closed in layers using 3-0 Vicryl and 4-0 Monocryl.  Dermabond was applied.  Upon completion of the case instrument and sharps counts were confirmed correct. The patient was transferred to the PACU in good condition. I was present for all portions of the procedure.  David Aline. Stanford Breed, MD [facility] Phone Number: [phone] 04/16/2020 9:53 AM

## 2020-04-17 ENCOUNTER — Encounter (HOSPITAL_COMMUNITY): Payer: Self-pay | Admitting: Vascular Surgery

## 2020-04-18 ENCOUNTER — Other Ambulatory Visit: Payer: Self-pay

## 2020-04-18 ENCOUNTER — Emergency Department (HOSPITAL_COMMUNITY)
Admission: EM | Admit: 2020-04-18 | Discharge: 2020-04-18 | Disposition: A | Discharge status: Home / Self Care | Payer: Medicare Other | Source: Home / Self Care

## 2020-04-18 ENCOUNTER — Encounter (HOSPITAL_COMMUNITY): Payer: Self-pay | Admitting: Emergency Medicine

## 2020-04-18 DIAGNOSIS — J9601 Acute respiratory failure with hypoxia: Secondary | ICD-10-CM | POA: Diagnosis not present

## 2020-04-18 DIAGNOSIS — M79602 Pain in left arm: Secondary | ICD-10-CM | POA: Insufficient documentation

## 2020-04-18 DIAGNOSIS — I5033 Acute on chronic diastolic (congestive) heart failure: Secondary | ICD-10-CM | POA: Diagnosis not present

## 2020-04-18 DIAGNOSIS — J90 Pleural effusion, not elsewhere classified: Secondary | ICD-10-CM | POA: Diagnosis not present

## 2020-04-18 DIAGNOSIS — R0602 Shortness of breath: Secondary | ICD-10-CM | POA: Diagnosis not present

## 2020-04-18 DIAGNOSIS — I509 Heart failure, unspecified: Secondary | ICD-10-CM | POA: Diagnosis not present

## 2020-04-18 DIAGNOSIS — Z5321 Procedure and treatment not carried out due to patient leaving prior to being seen by health care provider: Secondary | ICD-10-CM | POA: Insufficient documentation

## 2020-04-18 DIAGNOSIS — Z20822 Contact with and (suspected) exposure to covid-19: Secondary | ICD-10-CM | POA: Diagnosis not present

## 2020-04-18 DIAGNOSIS — M79642 Pain in left hand: Secondary | ICD-10-CM | POA: Insufficient documentation

## 2020-04-18 DIAGNOSIS — E1110 Type 2 diabetes mellitus with ketoacidosis without coma: Secondary | ICD-10-CM | POA: Diagnosis not present

## 2020-04-18 DIAGNOSIS — E8770 Fluid overload, unspecified: Secondary | ICD-10-CM | POA: Diagnosis not present

## 2020-04-18 DIAGNOSIS — I130 Hypertensive heart and chronic kidney disease with heart failure and stage 1 through stage 4 chronic kidney disease, or unspecified chronic kidney disease: Secondary | ICD-10-CM | POA: Diagnosis not present

## 2020-04-18 DIAGNOSIS — M25532 Pain in left wrist: Secondary | ICD-10-CM | POA: Diagnosis not present

## 2020-04-18 DIAGNOSIS — J189 Pneumonia, unspecified organism: Secondary | ICD-10-CM | POA: Diagnosis not present

## 2020-04-18 DIAGNOSIS — R0600 Dyspnea, unspecified: Secondary | ICD-10-CM | POA: Diagnosis not present

## 2020-04-18 DIAGNOSIS — I129 Hypertensive chronic kidney disease with stage 1 through stage 4 chronic kidney disease, or unspecified chronic kidney disease: Secondary | ICD-10-CM | POA: Diagnosis not present

## 2020-04-18 DIAGNOSIS — N2581 Secondary hyperparathyroidism of renal origin: Secondary | ICD-10-CM | POA: Diagnosis not present

## 2020-04-18 DIAGNOSIS — I10 Essential (primary) hypertension: Secondary | ICD-10-CM | POA: Diagnosis not present

## 2020-04-18 DIAGNOSIS — N184 Chronic kidney disease, stage 4 (severe): Secondary | ICD-10-CM | POA: Diagnosis not present

## 2020-04-18 DIAGNOSIS — I110 Hypertensive heart disease with heart failure: Secondary | ICD-10-CM | POA: Diagnosis not present

## 2020-04-18 DIAGNOSIS — D631 Anemia in chronic kidney disease: Secondary | ICD-10-CM | POA: Diagnosis not present

## 2020-04-18 NOTE — Consult Note (Signed)
Patient name: David [name] MRN: [id number] DOB: 1941/04/03 Sex: male  REASON FOR VISIT:     hand pain  HISTORY OF PRESENT ILLNESS:   David [name] is a 79 y.o. male who is status post left brachiocephalic fistula on 67/12/9379 by Dr. Standley Dakins.  The patient's wife called me and stated that the patient was having significant pain in his hand which kept him from sleeping.  I told him to come to the emergency department for further evaluation.  CURRENT MEDICATIONS:    No current facility-administered medications for this encounter.   Current Outpatient Medications  Medication Sig Dispense Refill  . acetaminophen (TYLENOL 8 HOUR ARTHRITIS PAIN) 650 MG CR tablet Take 650 mg by mouth every 8 (eight) hours as needed for pain.    Marland Kitchen amLODipine (NORVASC) 5 MG tablet TAKE 1 TABLET BY MOUTH EVERY DAY (Patient taking differently: Take 5 mg by mouth daily. ) 90 tablet 1  . atorvastatin (LIPITOR) 10 MG tablet TAKE 1 TABLET BY MOUTH EVERY DAY AT 6PM (Patient taking differently: Take 10 mg by mouth at bedtime. ) 90 tablet 1  . calcitRIOL (ROCALTROL) 0.25 MCG capsule Take 1 capsule by mouth once daily (Patient taking differently: Take 0.25 mcg by mouth daily. ) 90 capsule 0  . carvedilol (COREG) 25 MG tablet TAKE 1 TABLET BY MOUTH TWICE DAILY WITH A MEAL 180 tablet 0  . cholecalciferol (VITAMIN D) 1000 units tablet Take 1 tablet (1,000 Units total) by mouth daily. 30 tablet 1  . cholestyramine light (PREVALITE) 4 g packet Take 4 g by mouth daily.    . clopidogrel (PLAVIX) 75 MG tablet TAKE 1 TABLET BY MOUTH EVERY DAY 90 tablet 0  . folic acid (FOLVITE) 1 MG tablet Take 1 mg by mouth daily.    . hydrALAZINE (APRESOLINE) 100 MG tablet TAKE 1 TABLET BY MOUTH TWICE A DAY 180 tablet 0  . insulin aspart (NOVOLOG FLEXPEN) 100 UNIT/ML FlexPen INJECT 10 UNITS SUBCUTANEOUSLY BEFORE THE FIRST MEAL OF THE DAY AND 12 UNITS AT DINNER (Patient taking differently: Inject 1,012 Units  into the skin See admin instructions. INJECT 10 UNITS SUBCUTANEOUSLY BEFORE THE FIRST MEAL OF THE DAY AND 12 UNITS AT DINNER) 15 mL 1  . isosorbide mononitrate (IMDUR) 30 MG 24 hr tablet TAKE 2 TABLETS DAILY. PLEASE MAKE OVERDUE APPT WITH DR. Caryl Comes BEFORE ANYMORE REFILLS. 1ST ATTEMPT (Patient taking differently: Take 60 mg by mouth daily. ) 180 tablet 3  . Omega-3 Fatty Acids (FISH OIL) 1000 MG CAPS Take 1 capsule (1,000 mg total) by mouth every morning. 30 capsule 1  . pantoprazole (PROTONIX) 40 MG tablet TAKE 1 TABLET BY MOUTH EVERY DAY 90 tablet 0  . sertraline (ZOLOFT) 100 MG tablet TAKE 1 TABLET BY MOUTH EVERY DAY 90 tablet 1  . torsemide (DEMADEX) 20 MG tablet TAKE 2 TABS EVERY OTHER DAY. PLEASE MAKE OVERDUE APPT WITH DR. Caryl Comes BEFORE ANYMORE REFILLS. (Patient taking differently: Take 20 mg by mouth See admin instructions. Takes 2 tablets one day, then alternates with 1 tablet the next day) 90 tablet 3  . TRESIBA FLEXTOUCH 200 UNIT/ML FlexTouch Pen INJECT 44 UNITS SUBCUTANEOUSLY ONCE DAILY (Patient taking differently: Inject 44 Units into the skin at bedtime. INJECT 44 UNITS SUBCUTANEOUSLY ONCE DAIL) 9 mL 2  . VICTOZA 18 MG/3ML SOPN INJECT 0.3ML (1.8MG TOTAL) INTO THE SKIN DAILY BEFORE SUPPER (Patient taking differently: Inject 1.8 mg into the skin daily at 12 noon. ) 9 mL 2  . vitamin B-12 (  CYANOCOBALAMIN) 100 MCG tablet Take 100 mcg by mouth daily.    . blood glucose meter kit and supplies KIT Dispense based on patient and insurance preference. Use up to four times daily as directed. (FOR ICD-9 250.00, 250.01). 1 each 0  . glucose blood test strip Use Contour test strips as instructed to check blood sugar four times daily. 200 each 2  . Insulin Syringe-Needle U-100 (INSULIN SYRINGE 1CC/31GX5/16") 31G X 5/16" 1 ML MISC 1 each by Does not apply route 3 (three) times daily. Use insulin syringe to inject insulin three times daily. 100 each 2  . traMADol (ULTRAM) 50 MG tablet Take 1 tablet (50 mg  total) by mouth every 12 (twelve) hours as needed for moderate pain. 10 tablet 0    REVIEW OF SYSTEMS:   '[X]'  denotes positive finding, '[ ]'  denotes negative finding Cardiac  Comments:  Chest pain or chest pressure:    Shortness of breath upon exertion:    Short of breath when lying flat:    Irregular heart rhythm:    Constitutional    Fever or chills:      PHYSICAL EXAM:   Vitals:   04/16/20 0933 04/16/20 0947 04/16/20 1003 04/16/20 1018  BP: (!) 128/41 (!) 140/44 (!) 137/41 109/85  Pulse: (!) 59 60 (!) 59 (!) 59  Resp: '16 16 16 15  ' Temp:    98.1 F (36.7 C)  TempSrc:      SpO2: 96% 93% 94% 98%  Weight:      Height:        GENERAL: The patient is a well-nourished male, in no acute distress. The vital signs are documented above. CARDIOVASCULAR: There is a regular rate and rhythm. PULMONARY: Non-labored respirations There was an excellent thrill within the fistula.  I could not palpate a radial pulse on the left.  The incision is healing nicely without hematoma.  The patient states that his pain is in his wrist.  He does not endorse pain or numbness in his fingers.  His grip strength is decreased but he is able to make a fist.  STUDIES:   None   MEDICAL ISSUES:   Status post left brachiocephalic fistula.  I was initially concerned about steal syndrome, which he may have a mild component.  His pain is mainly in the wrist.  The hand is very warm.  He does not appear to have numbness or pain in his fingertips.  Says the pain is really centered around his wrist, I do not think that this requires emergent surgical ligation of his fistula.  I have instructed him to go home and continue with exercising his hand.  If he deteriorates, he will contact me.  I will have him follow-up in the office with Dr. Luan Pulling on Tuesday  Annamarie Major, IV, MD, [facility] [phone] Pager [phone]

## 2020-04-18 NOTE — ED Notes (Signed)
Pt surgeon asked for a place to examine patient.  Pt moved to a more private area for examination with surgeon.

## 2020-04-18 NOTE — ED Notes (Signed)
Following examination with surgeon.  Surgeon who placed fistula stated that patient was free to go home and could be discharged.  Surgeon stated that he had asked pt to come and be evaluated in the ED.   Patient and Pt wife stated they were going home since they were examined by surgeon and released to go home

## 2020-04-18 NOTE — ED Triage Notes (Signed)
C/o soreness to L arm and hand since having a fistula placed on Thursday.  Sling in place.  Took Tramadol PTA that has improved pain.

## 2020-04-20 ENCOUNTER — Other Ambulatory Visit: Payer: Self-pay

## 2020-04-20 ENCOUNTER — Emergency Department (HOSPITAL_COMMUNITY): Payer: Medicare Other

## 2020-04-20 ENCOUNTER — Telehealth: Payer: Self-pay

## 2020-04-20 ENCOUNTER — Inpatient Hospital Stay (HOSPITAL_COMMUNITY)
Admission: EM | Admit: 2020-04-20 | Discharge: 2020-04-26 | DRG: 291 | Disposition: A | Discharge status: Skilled Nursing Facility | Payer: Medicare Other | Attending: Internal Medicine | Admitting: Internal Medicine

## 2020-04-20 ENCOUNTER — Encounter (HOSPITAL_COMMUNITY): Payer: Self-pay | Admitting: Internal Medicine

## 2020-04-20 ENCOUNTER — Telehealth: Payer: Self-pay | Admitting: *Deleted

## 2020-04-20 ENCOUNTER — Encounter: Payer: Self-pay | Admitting: Family Medicine

## 2020-04-20 DIAGNOSIS — E1169 Type 2 diabetes mellitus with other specified complication: Secondary | ICD-10-CM | POA: Diagnosis present

## 2020-04-20 DIAGNOSIS — Z85828 Personal history of other malignant neoplasm of skin: Secondary | ICD-10-CM

## 2020-04-20 DIAGNOSIS — I509 Heart failure, unspecified: Secondary | ICD-10-CM | POA: Diagnosis not present

## 2020-04-20 DIAGNOSIS — E1122 Type 2 diabetes mellitus with diabetic chronic kidney disease: Secondary | ICD-10-CM | POA: Diagnosis present

## 2020-04-20 DIAGNOSIS — E1142 Type 2 diabetes mellitus with diabetic polyneuropathy: Secondary | ICD-10-CM | POA: Diagnosis present

## 2020-04-20 DIAGNOSIS — Z7401 Bed confinement status: Secondary | ICD-10-CM | POA: Diagnosis not present

## 2020-04-20 DIAGNOSIS — I495 Sick sinus syndrome: Secondary | ICD-10-CM | POA: Diagnosis present

## 2020-04-20 DIAGNOSIS — E1151 Type 2 diabetes mellitus with diabetic peripheral angiopathy without gangrene: Secondary | ICD-10-CM | POA: Diagnosis present

## 2020-04-20 DIAGNOSIS — Z9049 Acquired absence of other specified parts of digestive tract: Secondary | ICD-10-CM

## 2020-04-20 DIAGNOSIS — D72829 Elevated white blood cell count, unspecified: Secondary | ICD-10-CM | POA: Diagnosis present

## 2020-04-20 DIAGNOSIS — Z87891 Personal history of nicotine dependence: Secondary | ICD-10-CM

## 2020-04-20 DIAGNOSIS — I130 Hypertensive heart and chronic kidney disease with heart failure and stage 1 through stage 4 chronic kidney disease, or unspecified chronic kidney disease: Secondary | ICD-10-CM | POA: Diagnosis present

## 2020-04-20 DIAGNOSIS — G4733 Obstructive sleep apnea (adult) (pediatric): Secondary | ICD-10-CM | POA: Diagnosis present

## 2020-04-20 DIAGNOSIS — N2581 Secondary hyperparathyroidism of renal origin: Secondary | ICD-10-CM | POA: Diagnosis not present

## 2020-04-20 DIAGNOSIS — I739 Peripheral vascular disease, unspecified: Secondary | ICD-10-CM | POA: Diagnosis present

## 2020-04-20 DIAGNOSIS — H9193 Unspecified hearing loss, bilateral: Secondary | ICD-10-CM | POA: Diagnosis present

## 2020-04-20 DIAGNOSIS — Z888 Allergy status to other drugs, medicaments and biological substances status: Secondary | ICD-10-CM

## 2020-04-20 DIAGNOSIS — I160 Hypertensive urgency: Secondary | ICD-10-CM | POA: Diagnosis present

## 2020-04-20 DIAGNOSIS — E1165 Type 2 diabetes mellitus with hyperglycemia: Secondary | ICD-10-CM | POA: Diagnosis present

## 2020-04-20 DIAGNOSIS — R0602 Shortness of breath: Secondary | ICD-10-CM | POA: Diagnosis present

## 2020-04-20 DIAGNOSIS — J302 Other seasonal allergic rhinitis: Secondary | ICD-10-CM | POA: Diagnosis present

## 2020-04-20 DIAGNOSIS — J9601 Acute respiratory failure with hypoxia: Secondary | ICD-10-CM | POA: Diagnosis not present

## 2020-04-20 DIAGNOSIS — D631 Anemia in chronic kidney disease: Secondary | ICD-10-CM | POA: Diagnosis not present

## 2020-04-20 DIAGNOSIS — M6281 Muscle weakness (generalized): Secondary | ICD-10-CM | POA: Diagnosis present

## 2020-04-20 DIAGNOSIS — E785 Hyperlipidemia, unspecified: Secondary | ICD-10-CM | POA: Diagnosis not present

## 2020-04-20 DIAGNOSIS — Z950 Presence of cardiac pacemaker: Secondary | ICD-10-CM

## 2020-04-20 DIAGNOSIS — M25532 Pain in left wrist: Secondary | ICD-10-CM | POA: Diagnosis not present

## 2020-04-20 DIAGNOSIS — Z20822 Contact with and (suspected) exposure to covid-19: Secondary | ICD-10-CM | POA: Diagnosis present

## 2020-04-20 DIAGNOSIS — R5381 Other malaise: Secondary | ICD-10-CM

## 2020-04-20 DIAGNOSIS — Z79891 Long term (current) use of opiate analgesic: Secondary | ICD-10-CM

## 2020-04-20 DIAGNOSIS — K219 Gastro-esophageal reflux disease without esophagitis: Secondary | ICD-10-CM | POA: Diagnosis present

## 2020-04-20 DIAGNOSIS — R41841 Cognitive communication deficit: Secondary | ICD-10-CM | POA: Diagnosis present

## 2020-04-20 DIAGNOSIS — N186 End stage renal disease: Secondary | ICD-10-CM | POA: Diagnosis present

## 2020-04-20 DIAGNOSIS — R278 Other lack of coordination: Secondary | ICD-10-CM | POA: Diagnosis present

## 2020-04-20 DIAGNOSIS — R2681 Unsteadiness on feet: Secondary | ICD-10-CM | POA: Diagnosis present

## 2020-04-20 DIAGNOSIS — J189 Pneumonia, unspecified organism: Secondary | ICD-10-CM | POA: Diagnosis not present

## 2020-04-20 DIAGNOSIS — M2550 Pain in unspecified joint: Secondary | ICD-10-CM | POA: Diagnosis not present

## 2020-04-20 DIAGNOSIS — Z9989 Dependence on other enabling machines and devices: Secondary | ICD-10-CM

## 2020-04-20 DIAGNOSIS — I2510 Atherosclerotic heart disease of native coronary artery without angina pectoris: Secondary | ICD-10-CM | POA: Diagnosis present

## 2020-04-20 DIAGNOSIS — E8770 Fluid overload, unspecified: Secondary | ICD-10-CM | POA: Diagnosis not present

## 2020-04-20 DIAGNOSIS — I69311 Memory deficit following cerebral infarction: Secondary | ICD-10-CM | POA: Diagnosis not present

## 2020-04-20 DIAGNOSIS — F05 Delirium due to known physiological condition: Secondary | ICD-10-CM | POA: Diagnosis not present

## 2020-04-20 DIAGNOSIS — I5033 Acute on chronic diastolic (congestive) heart failure: Secondary | ICD-10-CM | POA: Diagnosis present

## 2020-04-20 DIAGNOSIS — Z974 Presence of external hearing-aid: Secondary | ICD-10-CM

## 2020-04-20 DIAGNOSIS — Z6833 Body mass index (BMI) 33.0-33.9, adult: Secondary | ICD-10-CM

## 2020-04-20 DIAGNOSIS — M1990 Unspecified osteoarthritis, unspecified site: Secondary | ICD-10-CM | POA: Diagnosis present

## 2020-04-20 DIAGNOSIS — I5031 Acute diastolic (congestive) heart failure: Secondary | ICD-10-CM | POA: Diagnosis not present

## 2020-04-20 DIAGNOSIS — I69392 Facial weakness following cerebral infarction: Secondary | ICD-10-CM | POA: Diagnosis not present

## 2020-04-20 DIAGNOSIS — Z89421 Acquired absence of other right toe(s): Secondary | ICD-10-CM

## 2020-04-20 DIAGNOSIS — R2689 Other abnormalities of gait and mobility: Secondary | ICD-10-CM | POA: Diagnosis present

## 2020-04-20 DIAGNOSIS — J90 Pleural effusion, not elsewhere classified: Secondary | ICD-10-CM | POA: Diagnosis not present

## 2020-04-20 DIAGNOSIS — I129 Hypertensive chronic kidney disease with stage 1 through stage 4 chronic kidney disease, or unspecified chronic kidney disease: Secondary | ICD-10-CM | POA: Diagnosis not present

## 2020-04-20 DIAGNOSIS — Z79899 Other long term (current) drug therapy: Secondary | ICD-10-CM

## 2020-04-20 DIAGNOSIS — Z794 Long term (current) use of insulin: Secondary | ICD-10-CM

## 2020-04-20 DIAGNOSIS — N184 Chronic kidney disease, stage 4 (severe): Secondary | ICD-10-CM | POA: Diagnosis present

## 2020-04-20 DIAGNOSIS — I4891 Unspecified atrial fibrillation: Secondary | ICD-10-CM | POA: Diagnosis present

## 2020-04-20 DIAGNOSIS — I421 Obstructive hypertrophic cardiomyopathy: Secondary | ICD-10-CM | POA: Diagnosis present

## 2020-04-20 DIAGNOSIS — N400 Enlarged prostate without lower urinary tract symptoms: Secondary | ICD-10-CM | POA: Diagnosis present

## 2020-04-20 DIAGNOSIS — Z7902 Long term (current) use of antithrombotics/antiplatelets: Secondary | ICD-10-CM

## 2020-04-20 DIAGNOSIS — I959 Hypotension, unspecified: Secondary | ICD-10-CM | POA: Diagnosis not present

## 2020-04-20 DIAGNOSIS — Z8673 Personal history of transient ischemic attack (TIA), and cerebral infarction without residual deficits: Secondary | ICD-10-CM

## 2020-04-20 DIAGNOSIS — F419 Anxiety disorder, unspecified: Secondary | ICD-10-CM | POA: Diagnosis present

## 2020-04-20 DIAGNOSIS — Z9641 Presence of insulin pump (external) (internal): Secondary | ICD-10-CM | POA: Diagnosis present

## 2020-04-20 DIAGNOSIS — R609 Edema, unspecified: Secondary | ICD-10-CM | POA: Diagnosis not present

## 2020-04-20 DIAGNOSIS — M109 Gout, unspecified: Secondary | ICD-10-CM | POA: Diagnosis present

## 2020-04-20 DIAGNOSIS — R0600 Dyspnea, unspecified: Secondary | ICD-10-CM | POA: Diagnosis not present

## 2020-04-20 DIAGNOSIS — Z91048 Other nonmedicinal substance allergy status: Secondary | ICD-10-CM

## 2020-04-20 DIAGNOSIS — E1110 Type 2 diabetes mellitus with ketoacidosis without coma: Secondary | ICD-10-CM | POA: Diagnosis not present

## 2020-04-20 DIAGNOSIS — E669 Obesity, unspecified: Secondary | ICD-10-CM | POA: Diagnosis present

## 2020-04-20 DIAGNOSIS — I110 Hypertensive heart disease with heart failure: Secondary | ICD-10-CM | POA: Diagnosis not present

## 2020-04-20 DIAGNOSIS — I10 Essential (primary) hypertension: Secondary | ICD-10-CM | POA: Diagnosis not present

## 2020-04-20 DIAGNOSIS — R0902 Hypoxemia: Secondary | ICD-10-CM | POA: Diagnosis not present

## 2020-04-20 LAB — I-STAT VENOUS BLOOD GAS, ED
Acid-base deficit: 3 mmol/L — ABNORMAL HIGH (ref 0.0–2.0)
Bicarbonate: 20.7 mmol/L (ref 20.0–28.0)
Calcium, Ion: 1.2 mmol/L (ref 1.15–1.40)
HCT: 26 % — ABNORMAL LOW (ref 39.0–52.0)
Hemoglobin: 8.8 g/dL — ABNORMAL LOW (ref 13.0–17.0)
O2 Saturation: 98 %
Potassium: 4 mmol/L (ref 3.5–5.1)
Sodium: 141 mmol/L (ref 135–145)
TCO2: 22 mmol/L (ref 22–32)
pCO2, Ven: 31.3 mmHg — ABNORMAL LOW (ref 44.0–60.0)
pH, Ven: 7.428 (ref 7.250–7.430)
pO2, Ven: 100 mmHg — ABNORMAL HIGH (ref 32.0–45.0)

## 2020-04-20 LAB — BASIC METABOLIC PANEL
Anion gap: 14 (ref 5–15)
BUN: 55 mg/dL — ABNORMAL HIGH (ref 8–23)
CO2: 17 mmol/L — ABNORMAL LOW (ref 22–32)
Calcium: 9.1 mg/dL (ref 8.9–10.3)
Chloride: 108 mmol/L (ref 98–111)
Creatinine, Ser: 2.9 mg/dL — ABNORMAL HIGH (ref 0.61–1.24)
GFR, Estimated: 21 mL/min — ABNORMAL LOW (ref >60)
Glucose, Bld: 117 mg/dL — ABNORMAL HIGH (ref 70–99)
Potassium: 4.1 mmol/L (ref 3.5–5.1)
Sodium: 139 mmol/L (ref 135–145)

## 2020-04-20 LAB — BRAIN NATRIURETIC PEPTIDE: B Natriuretic Peptide: 1198.8 pg/mL — ABNORMAL HIGH (ref 0.0–100.0)

## 2020-04-20 LAB — CBC
HCT: 28.7 % — ABNORMAL LOW (ref 39.0–52.0)
Hemoglobin: 9 g/dL — ABNORMAL LOW (ref 13.0–17.0)
MCH: 31.3 pg (ref 26.0–34.0)
MCHC: 31.4 g/dL (ref 30.0–36.0)
MCV: 99.7 fL (ref 80.0–100.0)
Platelets: 183 10*3/uL (ref 150–400)
RBC: 2.88 MIL/uL — ABNORMAL LOW (ref 4.22–5.81)
RDW: 14.7 % (ref 11.5–15.5)
WBC: 11.9 10*3/uL — ABNORMAL HIGH (ref 4.0–10.5)
nRBC: 0 % (ref 0.0–0.2)

## 2020-04-20 LAB — PROCALCITONIN: Procalcitonin: 0.2 ng/mL

## 2020-04-20 LAB — RESP PANEL BY RT-PCR (FLU A&B, COVID) ARPGX2
Influenza A by PCR: NEGATIVE
Influenza B by PCR: NEGATIVE
SARS Coronavirus 2 by RT PCR: NEGATIVE

## 2020-04-20 MED ORDER — TRAMADOL HCL 50 MG PO TABS
50.0000 mg | ORAL_TABLET | ORAL | Status: AC
  Administered 2020-04-20: 50 mg via ORAL
  Filled 2020-04-20: qty 1

## 2020-04-20 MED ORDER — ATORVASTATIN CALCIUM 10 MG PO TABS
10.0000 mg | ORAL_TABLET | Freq: Every day | ORAL | Status: DC
  Administered 2020-04-20 – 2020-04-25 (×6): 10 mg via ORAL
  Filled 2020-04-20 (×6): qty 1

## 2020-04-20 MED ORDER — ACETAMINOPHEN 650 MG RE SUPP: 650.0000 mg | Freq: Four times a day (QID) | RECTAL | Status: DC | PRN

## 2020-04-20 MED ORDER — SODIUM CHLORIDE 0.9 % IV SOLN
1.0000 g | Freq: Once | INTRAVENOUS | Status: AC
  Administered 2020-04-20: 1 g via INTRAVENOUS
  Filled 2020-04-20: qty 10

## 2020-04-20 MED ORDER — TRAMADOL HCL 50 MG PO TABS
50.0000 mg | ORAL_TABLET | Freq: Two times a day (BID) | ORAL | Status: DC | PRN
  Administered 2020-04-20 – 2020-04-22 (×3): 50 mg via ORAL
  Filled 2020-04-20 (×3): qty 1

## 2020-04-20 MED ORDER — AMLODIPINE BESYLATE 5 MG PO TABS
5.0000 mg | ORAL_TABLET | Freq: Every day | ORAL | Status: DC
  Administered 2020-04-20 – 2020-04-26 (×7): 5 mg via ORAL
  Filled 2020-04-20 (×7): qty 1

## 2020-04-20 MED ORDER — CHOLESTYRAMINE LIGHT 4 G PO PACK
4.0000 g | PACK | Freq: Every day | ORAL | Status: DC
  Administered 2020-04-20 – 2020-04-26 (×6): 4 g via ORAL
  Filled 2020-04-20 (×7): qty 1

## 2020-04-20 MED ORDER — ONDANSETRON HCL 4 MG PO TABS: 4.0000 mg | ORAL_TABLET | Freq: Four times a day (QID) | ORAL | Status: DC | PRN

## 2020-04-20 MED ORDER — AZITHROMYCIN 250 MG PO TABS
500.0000 mg | ORAL_TABLET | Freq: Once | ORAL | Status: AC
  Administered 2020-04-20: 500 mg via ORAL
  Filled 2020-04-20: qty 2

## 2020-04-20 MED ORDER — HEPARIN SODIUM (PORCINE) 5000 UNIT/ML IJ SOLN
5000.0000 [IU] | Freq: Three times a day (TID) | INTRAMUSCULAR | Status: DC
  Administered 2020-04-20 – 2020-04-26 (×18): 5000 [IU] via SUBCUTANEOUS
  Filled 2020-04-20 (×18): qty 1

## 2020-04-20 MED ORDER — GUAIFENESIN ER 600 MG PO TB12
600.0000 mg | ORAL_TABLET | Freq: Two times a day (BID) | ORAL | Status: DC
  Administered 2020-04-20 – 2020-04-26 (×12): 600 mg via ORAL
  Filled 2020-04-20 (×13): qty 1

## 2020-04-20 MED ORDER — FUROSEMIDE 10 MG/ML IJ SOLN
40.0000 mg | Freq: Once | INTRAMUSCULAR | Status: AC
  Administered 2020-04-20: 40 mg via INTRAVENOUS
  Filled 2020-04-20: qty 4

## 2020-04-20 MED ORDER — INSULIN ASPART 100 UNIT/ML ~~LOC~~ SOLN
0.0000 [IU] | Freq: Three times a day (TID) | SUBCUTANEOUS | Status: DC
  Administered 2020-04-21: 3 [IU] via SUBCUTANEOUS
  Administered 2020-04-22: 5 [IU] via SUBCUTANEOUS
  Administered 2020-04-22: 2 [IU] via SUBCUTANEOUS
  Administered 2020-04-23: 5 [IU] via SUBCUTANEOUS
  Administered 2020-04-23: 8 [IU] via SUBCUTANEOUS
  Administered 2020-04-23: 3 [IU] via SUBCUTANEOUS
  Administered 2020-04-24: 8 [IU] via SUBCUTANEOUS
  Administered 2020-04-24: 5 [IU] via SUBCUTANEOUS
  Administered 2020-04-24: 8 [IU] via SUBCUTANEOUS
  Administered 2020-04-25 – 2020-04-26 (×5): 5 [IU] via SUBCUTANEOUS

## 2020-04-20 MED ORDER — SODIUM CHLORIDE 0.9% FLUSH
3.0000 mL | Freq: Two times a day (BID) | INTRAVENOUS | Status: DC
  Administered 2020-04-20 – 2020-04-26 (×12): 3 mL via INTRAVENOUS

## 2020-04-20 MED ORDER — CLOPIDOGREL BISULFATE 75 MG PO TABS
75.0000 mg | ORAL_TABLET | Freq: Every day | ORAL | Status: DC
  Administered 2020-04-20 – 2020-04-26 (×7): 75 mg via ORAL
  Filled 2020-04-20 (×7): qty 1

## 2020-04-20 MED ORDER — CARVEDILOL 25 MG PO TABS
25.0000 mg | ORAL_TABLET | Freq: Two times a day (BID) | ORAL | Status: DC
  Administered 2020-04-21 – 2020-04-26 (×11): 25 mg via ORAL
  Filled 2020-04-20 (×11): qty 1

## 2020-04-20 MED ORDER — ALBUTEROL SULFATE (2.5 MG/3ML) 0.083% IN NEBU: 2.5000 mg | INHALATION_SOLUTION | Freq: Four times a day (QID) | RESPIRATORY_TRACT | Status: DC | PRN

## 2020-04-20 MED ORDER — CALCITRIOL 0.25 MCG PO CAPS
0.2500 ug | ORAL_CAPSULE | Freq: Every day | ORAL | Status: DC
  Administered 2020-04-20 – 2020-04-26 (×7): 0.25 ug via ORAL
  Filled 2020-04-20 (×7): qty 1

## 2020-04-20 MED ORDER — ISOSORBIDE MONONITRATE ER 60 MG PO TB24
60.0000 mg | ORAL_TABLET | Freq: Every day | ORAL | Status: DC
  Administered 2020-04-20 – 2020-04-26 (×7): 60 mg via ORAL
  Filled 2020-04-20 (×4): qty 1
  Filled 2020-04-20: qty 2
  Filled 2020-04-20 (×2): qty 1

## 2020-04-20 MED ORDER — SERTRALINE HCL 100 MG PO TABS
100.0000 mg | ORAL_TABLET | Freq: Every day | ORAL | Status: DC
  Administered 2020-04-20 – 2020-04-25 (×6): 100 mg via ORAL
  Filled 2020-04-20 (×6): qty 1

## 2020-04-20 MED ORDER — ACETAMINOPHEN 325 MG PO TABS
650.0000 mg | ORAL_TABLET | Freq: Four times a day (QID) | ORAL | Status: DC | PRN
  Administered 2020-04-21 – 2020-04-24 (×5): 650 mg via ORAL
  Filled 2020-04-20 (×5): qty 2

## 2020-04-20 MED ORDER — SODIUM BICARBONATE 650 MG PO TABS
1300.0000 mg | ORAL_TABLET | Freq: Three times a day (TID) | ORAL | Status: DC
  Administered 2020-04-20 – 2020-04-26 (×17): 1300 mg via ORAL
  Filled 2020-04-20 (×17): qty 2

## 2020-04-20 MED ORDER — ONDANSETRON HCL 4 MG/2ML IJ SOLN: 4.0000 mg | Freq: Four times a day (QID) | INTRAMUSCULAR | Status: DC | PRN

## 2020-04-20 MED ORDER — FUROSEMIDE 10 MG/ML IJ SOLN
80.0000 mg | Freq: Two times a day (BID) | INTRAMUSCULAR | Status: DC
  Administered 2020-04-21 – 2020-04-23 (×6): 80 mg via INTRAVENOUS
  Filled 2020-04-20 (×6): qty 8

## 2020-04-20 MED ORDER — HYDRALAZINE HCL 50 MG PO TABS
100.0000 mg | ORAL_TABLET | Freq: Two times a day (BID) | ORAL | Status: DC
  Administered 2020-04-20 – 2020-04-26 (×12): 100 mg via ORAL
  Filled 2020-04-20 (×12): qty 2

## 2020-04-20 NOTE — Telephone Encounter (Signed)
Called to schedule appt with Dr. Stanford Breed tomorrow for steal symptoms in AVF per Dr. Trula Slade. Spoke with pts wife. Pt is having trouble breathing and home health is evaluating. Pt will call back to schedule appt once respiratory issues have resolved.

## 2020-04-20 NOTE — ED Notes (Signed)
RN made aware of BP 

## 2020-04-20 NOTE — ED Notes (Signed)
Pt given lunch bag and half a cup of diet pepsi. His RN explained to pt that we want to limit his fluid intake. Pt reports understanding.

## 2020-04-20 NOTE — H&P (Incomplete)
History and Physical    David [name] TTS:[id number] DOB: [birth year] DOA: 04/20/2020  Referring MD/NP/PA: Dorie Rank, MD PCP: [name], MD  Consultants: Dr. Verneda Skill nephrology Patient coming from: Home via EMS  Chief Complaint: Shortness of breath  I have personally briefly reviewed patient's old medical records in [place]   HPI: David [name] is a 79 y.o. 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  Patient reports that he has had progressively worsening shortness of breath over the last 1 month, but was acutely worse this morning.  Wife reports that his O2 saturations dropped into the 70s.  Normally patient does not require oxygen at baseline.  He had associated symptoms of chills and sweats.  He had been also having a dry productive cough.  Patient had just recently had AV fistula placed on December 2 in preparation for need of dialysis in the future.  Since the procedure he is having left wrist pain and discomfort.  Approximately 2 weeks ago they increased his torsemide from 40 mg every other day to alternating torsemide 40 and 20 mg daily.  Denies having any recent fever, chest pain, nausea, vomiting, or diarrhea.  ED Course: Upon admission into the emergency department patient was seen to be afebrile, respirations 16-23, blood pressure elevated up to 200/60, and O2 saturation currently maintained on 2 L nasal cannula oxygen greater than 91%.  Labs significant for WBC 11.9, hemoglobin 9, BUN 35, creatinine 2.9, and BNP 1198.  Chest x-ray showed left lower lobe airspace disease atelectasis with potentially small bilateral pleural effusions.  X-rays of the left wrist showed only degenerative changes.  Patient had been given empiric antibiotics of Rocephin, azithromycin, and furosemide 40 mg IV.  Review of Systems   Constitutional: Positive for chills and diaphoresis. Negative for fever.  HENT: Negative for ear discharge and nosebleeds.   Eyes: Negative for blurred vision and discharge.  Respiratory: Positive for cough and shortness of breath.   Cardiovascular: Positive for orthopnea and leg swelling. Negative for chest pain.  Gastrointestinal: Negative for abdominal pain, nausea and vomiting.  Genitourinary: Negative for dysuria and hematuria.  Musculoskeletal: Positive for joint pain.  Neurological: Negative for focal weakness and loss of consciousness.  Psychiatric/Behavioral: Negative for memory loss and substance abuse.    Past Medical History:  Diagnosis Date  . Acute cholecystitis s/p lap cholecystectomy 03/05/2017 03/04/2017  . Anemia, iron deficiency   . Anxiety   . Arthritis   . Atrial fibrillation with rapid ventricular response ([place])   . BPH (benign prostatic hypertrophy)   . CAD (coronary artery disease)    Nonobstructive CAD per cath  . Cardiac pacemaker in situ   . CHF (congestive heart failure) ([place])   . Chronic ulcer of right foot ([place])    resolved per patient  . CKD (chronic kidney disease) stage 4, GFR 15-29 ml/min (HCC) 09/26/2017  . CKD (chronic kidney disease), stage III ([place]) secondary to DM and HTN   nephrologist-  [name]  . Coronary artery disease involving native coronary artery of native heart without angina pectoris   . Depression   . Diastolic dysfunction   . Dyspnea    with exertion, no oxygen  . 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 Hearing loss    wears hearing aids  .  History of cellulitis    right great toe 10-25-2014  . History of skin cancer   . HOCM (hypertrophic obstructive cardiomyopathy) ([place])   . Hyperlipidemia associated with type 2 diabetes mellitus ([place]) 10/25/2013  . Hypertension   . Hypertension associated with diabetes ([place]) 10/25/2013  . IDDM (insulin dependent diabetes mellitus) - on  insulin pump 03/04/2017  . Insulin dependent type 2 diabetes mellitus ([place]) 1991   followd by [name]--  has insulin pump  . Insulin pump in place    Patient does not have insulin pump - removed approx 2 yrs ago 2019  . Memory loss    mild per patient  . OSA on CPAP   . Pacemaker 06/03/2015   Medtronic  . Peripheral neuropathy    severe - feet/lower legs  . Peripheral vascular disease ([place])    bilateral lower extremities  . PVD (peripheral vascular disease) ([place])   . Rib fracture 07/24/2015  . Seasonal and perennial allergic rhinitis 10/25/2013  . Seborrheic keratosis 09/19/2016  . Secondary hyperparathyroidism of renal origin ([place])   . 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 Sinus node dysfunction (HCC)   . Sleep apnea   . Syncope 07/24/2015  . Walker as ambulation aid     Past Surgical History:  Procedure Laterality Date  . 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 AMPUTATION TOE Right 03/12/2015   Procedure: RIGHT HALLUS AMPUTATION ;  Surgeon: [name], MD;  Location: [facility];  Service: Podiatry;  Laterality: Right;  . AV FISTULA PLACEMENT Left 04/16/2020   Procedure: CREATION OF LEFT Brachial/Cephalic Arteriovenous Fistula.;  Surgeon: Cherre Robins, MD;  Location: [facility];  Service: Vascular;  Laterality: Left;  . CARDIAC CATHETERIZATION  11-25-2010   [place], [place]   Nonobstructive CAD  . CARDIAC PACEMAKER PLACEMENT  Nov 2009   Medtronic  . CHOLECYSTECTOMY N/A 03/05/2017   Procedure: LAPAROSCOPIC CHOLECYSTECTOMY WITH INTRAOPERATIVE CHOLANGIOGRAM;  Surgeon: [name], MD;  Location: [facility];  Service: General;  Laterality: N/A;  . EP IMPLANTABLE DEVICE N/A 06/03/2015   Procedure: PPM Generator Changeout;  Surgeon: [name], MD;  Location: [facility];  Service: Cardiovascular;  Laterality: N/A;  . EXCISION BONE CYST Right 03/06/2015   Procedure: BONE BIOPSIES OF RIGHT FOOT;  Surgeon: [name], MD;   Location: [facility];  Service: Podiatry;  Laterality: Right;  . EYE SURGERY Bilateral   . KNEE ARTHROSCOPY    . ORIF ANKLE FRACTURE Left 11/06/2014   Procedure: OPEN REDUCTION INTERNAL FIXATION (ORIF) LEFT  ANKLE FRACTURE;  Surgeon: [name], MD;  Location: [facility];  Service: Orthopedics;  Laterality: Left;  . TEE WITHOUT CARDIOVERSION N/A 09/01/2017   Procedure: TRANSESOPHAGEAL ECHOCARDIOGRAM (TEE);  Surgeon: [name], MD;  Location: [facility];  Service: Cardiovascular;  Laterality: N/A;  . TOTAL KNEE ARTHROPLASTY     patient denies this surgery  . VEIN LIGATION AND STRIPPING       reports that he quit smoking about 36 years ago. He has a 60.00 pack-year smoking history. He has never used smokeless tobacco. He reports current alcohol use of about 1.0 standard drink of alcohol per week. He reports that he does not use drugs.  Allergies  Allergen Reactions  . Ativan [Lorazepam] Anxiety    Pt gets more agitated  . Adhesive [Tape] Other (See Comments)    blisters    Family History  Problem Relation Age  of Onset  . Cancer Mother        breast  . Heart attack Father   . Stroke Father     Prior to Admission medications   Medication Sig Start Date End Date Taking? Authorizing Provider  acetaminophen (TYLENOL 8 HOUR ARTHRITIS PAIN) 650 MG CR tablet Take 650 mg by mouth every 8 (eight) hours as needed for pain.    [provider]  amLODipine (NORVASC) 5 MG tablet TAKE 1 TABLET BY MOUTH EVERY DAY Patient taking differently: Take 5 mg by mouth daily.  01/24/20   [name], MD  atorvastatin (LIPITOR) 10 MG tablet TAKE 1 TABLET BY MOUTH EVERY DAY AT 6PM Patient taking differently: Take 10 mg by mouth at bedtime.  01/24/20   [name], MD  blood glucose meter kit and supplies KIT Dispense based on patient and insurance preference. Use up to four times daily as directed. (FOR ICD-9 250.00, 250.01). 01/12/19   Elodia Florence.,  MD  calcitRIOL (ROCALTROL) 0.25 MCG capsule Take 1 capsule by mouth once daily Patient taking differently: Take 0.25 mcg by mouth daily.  09/13/18   [name], MD  carvedilol (COREG) 25 MG tablet TAKE 1 TABLET BY MOUTH TWICE DAILY WITH A MEAL 04/13/20   [name], MD  cholecalciferol (VITAMIN D) 1000 units tablet Take 1 tablet (1,000 Units total) by mouth daily. 02/01/17   [name], MD  cholestyramine light (PREVALITE) 4 g packet Take 4 g by mouth daily.    [provider]  clopidogrel (PLAVIX) 75 MG tablet TAKE 1 TABLET BY MOUTH EVERY DAY 04/13/20   [name], MD  folic acid (FOLVITE) 1 MG tablet Take 1 mg by mouth daily.    [provider]  glucose blood test strip Use Contour test strips as instructed to check blood sugar four times daily. 10/25/19   Elayne Snare, MD  hydrALAZINE (APRESOLINE) 100 MG tablet TAKE 1 TABLET BY MOUTH TWICE A DAY 04/13/20   [name], MD  insulin aspart (NOVOLOG FLEXPEN) 100 UNIT/ML FlexPen INJECT 10 UNITS SUBCUTANEOUSLY BEFORE THE FIRST MEAL OF THE DAY AND 12 UNITS AT [facility] Patient taking differently: Inject 1,012 Units into the skin See admin instructions. INJECT 10 UNITS SUBCUTANEOUSLY BEFORE THE FIRST MEAL OF THE DAY AND 12 UNITS AT [facility] 02/11/20   Elayne Snare, MD  Insulin Syringe-Needle U-100 (INSULIN SYRINGE 1CC/31GX5/16") 31G X 5/16" 1 ML MISC 1 each by Does not apply route 3 (three) times daily. Use insulin syringe to inject insulin three times daily. 01/08/19   Elayne Snare, MD  isosorbide mononitrate (IMDUR) 30 MG 24 hr tablet TAKE 2 TABLETS DAILY. PLEASE MAKE OVERDUE APPT WITH DR. Caryl Comes BEFORE ANYMORE REFILLS. 1ST ATTEMPT Patient taking differently: Take 60 mg by mouth daily.  02/03/20   [name], MD  Omega-3 Fatty Acids (FISH OIL) 1000 MG CAPS Take 1 capsule (1,000 mg total) by mouth every morning. 03/06/19   Garvin Fila, MD  pantoprazole (PROTONIX) 40 MG tablet TAKE 1 TABLET BY MOUTH EVERY DAY 04/13/20    [name], MD  sertraline (ZOLOFT) 100 MG tablet TAKE 1 TABLET BY MOUTH EVERY DAY 04/13/20   [name], MD  torsemide (DEMADEX) 20 MG tablet TAKE 2 TABS EVERY OTHER DAY. PLEASE MAKE OVERDUE APPT WITH DR. Caryl Comes BEFORE ANYMORE REFILLS. Patient taking differently: Take 20 mg by mouth See admin instructions. Takes 2 tablets one day, then alternates with 1 tablet  the next day 02/18/20   Sueanne Margarita, MD  traMADol (ULTRAM) 50 MG tablet Take 1 tablet (50 mg total) by mouth every 12 (twelve) hours as needed for moderate pain. 04/16/20   Dagoberto Ligas, PA-C  TRESIBA FLEXTOUCH 200 UNIT/ML FlexTouch Pen INJECT 44 UNITS SUBCUTANEOUSLY ONCE DAILY Patient taking differently: Inject 44 Units into the skin at bedtime. INJECT 44 UNITS SUBCUTANEOUSLY ONCE DAIL 03/02/20   Elayne Snare, MD  VICTOZA 18 MG/3ML SOPN INJECT 0.3ML (1.8MG TOTAL) INTO THE SKIN DAILY BEFORE SUPPER Patient taking differently: Inject 1.8 mg into the skin daily at 12 noon.  08/26/19   Elayne Snare, MD  vitamin B-12 (CYANOCOBALAMIN) 100 MCG tablet Take 100 mcg by mouth daily.    [provider]    Physical Exam:  Constitutional: Obese male who appears to be in some respiratory Vitals:   04/20/20 1307 04/20/20 1500 04/20/20 1600 04/20/20 1627  BP: (!) 200/60 (!) 168/58 (!) 170/45 (!) 181/66  Pulse: 61 61 61 64  Resp: (!) 21 (!) 23 16 (!) 22  Temp: 98.9 F (37.2 C)   98.9 F (37.2 C)  TempSrc: Oral   Oral  SpO2: 94% 91% 92% 92%   Eyes: PERRL, lids and conjunctivae normal ENMT: Mucous membranes are moist. Posterior pharynx clear of any exudate or lesions.  Neck: normal, supple, no masses, no thyromegaly Respiratory: Mildly tachypneic with rales and some intermittent crackles appreciated.  Patient currently on 2 L nasal cannula oxygen with O2 saturation maintained. Cardiovascular: Regular rate and rhythm, no murmurs / rubs / gallops. No extremity edema. 2+ pedal pulses. No carotid bruits.  AV fistula of the left  arm. Abdomen: no tenderness, no masses palpated. No hepatosplenomegaly. Bowel sounds positive.  Musculoskeletal: no clubbing / cyanosis.  Decreased range of motion of the left wrist with tenderness to palpation.  Amputations noted of the first and second digit of the right foot. Skin: Healing surgical wounds with no significant erythema.  2 ulcerations noted to the left great toe Neurologic: CN 2-12 grossly intact. Sensation intact, DTR normal. Strength 5/5 in all 4.  Psychiatric: Normal judgment and insight. Alert and oriented x 3. Normal mood.     Labs on Admission: I have personally reviewed following labs and imaging studies  CBC: Recent Labs  Lab 04/15/20 1032 04/16/20 0617 04/20/20 1315 04/20/20 1327  WBC  --   --  11.9*  --   HGB 9.1* 9.9* 9.0* 8.8*  HCT  --  29.0* 28.7* 26.0*  MCV  --   --  99.7  --   PLT  --   --  183  --    Basic Metabolic Panel: Recent Labs  Lab 04/15/20 1100 04/16/20 0617 04/20/20 1315 04/20/20 1327  NA 142 143 139 141  K 3.8 4.1 4.1 4.0  CL 112* 110 108  --   CO2 18*  --  17*  --   GLUCOSE 89 104* 117*  --   BUN 61* 59* 55*  --   CREATININE 3.04* 2.90* 2.90*  --   CALCIUM 9.0  --  9.1  --   PHOS 3.9  --   --   --    GFR: Estimated Creatinine Clearance: 29.7 mL/min (A) (by C-G formula based on SCr of 2.9 mg/dL (H)). Liver Function Tests: Recent Labs  Lab 04/15/20 1100  ALBUMIN 3.0*   No results for input(s): LIPASE, AMYLASE in the last 168 hours. No results for input(s): AMMONIA in the last 168 hours. Coagulation  Profile: No results for input(s): INR, PROTIME in the last 168 hours. Cardiac Enzymes: No results for input(s): CKTOTAL, CKMB, CKMBINDEX, TROPONINI in the last 168 hours. BNP (last 3 results) No results for input(s): PROBNP in the last 8760 hours. HbA1C: No results for input(s): HGBA1C in the last 72 hours. CBG: Recent Labs  Lab 04/16/20 0918  GLUCAP 105*   Lipid Profile: No results for input(s): CHOL, HDL,  LDLCALC, TRIG, CHOLHDL, LDLDIRECT in the last 72 hours. Thyroid Function Tests: No results for input(s): TSH, T4TOTAL, FREET4, T3FREE, THYROIDAB in the last 72 hours. Anemia Panel: No results for input(s): VITAMINB12, FOLATE, FERRITIN, TIBC, IRON, RETICCTPCT in the last 72 hours. Urine analysis:    Component Value Date/Time   COLORURINE STRAW (A) 01/09/2019 1900   APPEARANCEUR CLEAR 01/09/2019 1900   LABSPEC 1.009 01/09/2019 1900   PHURINE 5.0 01/09/2019 1900   GLUCOSEU NEGATIVE 01/09/2019 1900   GLUCOSEU NEGATIVE 03/03/2015 1501   HGBUR NEGATIVE 01/09/2019 1900   BILIRUBINUR NEGATIVE 01/09/2019 1900   KETONESUR NEGATIVE 01/09/2019 1900   PROTEINUR NEGATIVE 01/09/2019 1900   UROBILINOGEN 0.2 03/03/2015 1501   NITRITE NEGATIVE 01/09/2019 1900   LEUKOCYTESUR NEGATIVE 01/09/2019 1900   Sepsis Labs: Recent Results (from the past 240 hour(s))  SARS CORONAVIRUS 2 (TAT 6-24 HRS) Nasopharyngeal Nasopharyngeal Swab     Status: None   Collection Time: 04/14/20  1:52 PM   Specimen: Nasopharyngeal Swab  Result Value Ref Range Status   SARS Coronavirus 2 NEGATIVE NEGATIVE Final    Comment: (NOTE) SARS-CoV-2 target nucleic acids are NOT DETECTED.  The SARS-CoV-2 RNA is generally detectable in upper and lower respiratory specimens during the acute phase of infection. Negative results do not preclude SARS-CoV-2 infection, do not rule out co-infections with other pathogens, and should not be used as the sole basis for treatment or other patient management decisions. Negative results must be combined with clinical observations, patient history, and epidemiological information. The expected result is Negative.  Fact Sheet for Patients: SugarRoll.be  Fact Sheet for Healthcare Providers: https://www.woods-mathews.com/  This test is not yet approved or cleared by the [place] FDA and  has been authorized for detection and/or diagnosis of  SARS-CoV-2 by FDA under an Emergency Use Authorization (EUA). This EUA will remain  in effect (meaning this test can be used) for the duration of the COVID-19 declaration under Se ction 564(b)(1) of the Act, 21 U.S.C. section 360bbb-3(b)(1), unless the authorization is terminated or revoked sooner.  Performed at [facility], [place] [address], [place], [place] [address]   Resp Panel by RT-PCR (Flu A&B, Covid) Nasopharyngeal Swab     Status: None   Collection Time: 04/20/20  1:10 PM   Specimen: Nasopharyngeal Swab; Nasopharyngeal(NP) swabs in vial transport medium  Result Value Ref Range Status   SARS Coronavirus 2 by RT PCR NEGATIVE NEGATIVE Final    Comment: (NOTE) SARS-CoV-2 target nucleic acids are NOT DETECTED.  The SARS-CoV-2 RNA is generally detectable in upper respiratory specimens during the acute phase of infection. The lowest concentration of SARS-CoV-2 viral copies this assay can detect is 138 copies/mL. A negative result does not preclude SARS-Cov-2 infection and should not be used as the sole basis for treatment or other patient management decisions. A negative result may occur with  improper specimen collection/handling, submission of specimen other than nasopharyngeal swab, presence of viral mutation(s) within the areas targeted by this assay, and inadequate number of viral copies(<138 copies/mL). A negative result must be combined with clinical  observations, patient history, and epidemiological information. The expected result is Negative.  Fact Sheet for Patients:  EntrepreneurPulse.com.au  Fact Sheet for Healthcare Providers:  IncredibleEmployment.be  This test is no t yet approved or cleared by the [place] FDA and  has been authorized for detection and/or diagnosis of SARS-CoV-2 by FDA under an Emergency Use Authorization (EUA). This EUA will remain  in effect (meaning this test can be used) for the duration of  the COVID-19 declaration under Section 564(b)(1) of the Act, 21 U.S.C.section 360bbb-3(b)(1), unless the authorization is terminated  or revoked sooner.       Influenza A by PCR NEGATIVE NEGATIVE Final   Influenza B by PCR NEGATIVE NEGATIVE Final    Comment: (NOTE) The Xpert Xpress SARS-CoV-2/FLU/RSV plus assay is intended as an aid in the diagnosis of influenza from Nasopharyngeal swab specimens and should not be used as a sole basis for treatment. Nasal washings and aspirates are unacceptable for Xpert Xpress SARS-CoV-2/FLU/RSV testing.  Fact Sheet for Patients: EntrepreneurPulse.com.au  Fact Sheet for Healthcare Providers: IncredibleEmployment.be  This test is not yet approved or cleared by the [place] FDA and has been authorized for detection and/or diagnosis of SARS-CoV-2 by FDA under an Emergency Use Authorization (EUA). This EUA will remain in effect (meaning this test can be used) for the duration of the COVID-19 declaration under Section 564(b)(1) of the Act, 21 U.S.C. section 360bbb-3(b)(1), unless the authorization is terminated or revoked.  Performed at [facility], [place] [address] [place], [place] [address]      Radiological Exams on Admission: DG Wrist Complete Left  Result Date: 04/20/2020 CLINICAL DATA:  Wrist pain procedure for fistula on LEFT arm with pain in LEFT wrist EXAM: LEFT WRIST - COMPLETE 3+ VIEW COMPARISON:  None FINDINGS: Extensive vascular calcifications project over the soft tissues of the LEFT wrist. Extensive radiocarpal joint space narrowing. Mild carpometacarpal degenerative changes of the first carpometacarpal joint. No sign of acute fracture or bone abnormality. IMPRESSION: 1. No acute fracture or dislocation. 2. Degenerative changes about the wrist. 3. Extensive vascular calcifications in the soft tissues of the LEFT wrist. Electronically Signed   By: Zetta Bills [name]   On: 04/20/2020 14:43    DG Chest Port 1 View  Result Date: 04/20/2020 CLINICAL DATA:  Dyspnea, shortness of breath history of lower extremity edema, CHF and hypertension EXAM: PORTABLE CHEST 1 VIEW COMPARISON:  January 10, 2019 FINDINGS: LEFT-sided power pack for dual lead pacer device projects over LEFT chest. Trachea midline. Cardiomediastinal contours and hilar structures are similar to the prior exam with central pulmonary vascular engorgement. Partially obscured LEFT hemidiaphragm. Graded opacity at RIGHT lung base. On limited assessment no acute skeletal process. IMPRESSION: LEFT lower lobe airspace disease, atelectasis or developing infection. 1. Potential small bilateral pleural effusions. Electronically Signed   By: Zetta Bills [name]   On: 04/20/2020 13:53    EKG: Independently reviewed.  Paced rhythm  Assessment/Plan Acute respiratory failure with hypoxia secondary suspected pneumonia: Acute.  Patient presents with complaints of acutely worsening shortness of breath and dry cough. O2 saturations noted to be as low as 70% at home on room air. Chest x-ray showed concern for possibility of left lower lobe infiltrate with concern for potential small bilateral pleural effusion. -Admit to a medical telemetry bed  -Continuous pulse oximetry with nasal cannula oxygen to maintain O2 saturations. -Follow-up blood cultures -Add on procalcitonin -Continue empiric antibiotics of Rocephin and azithromycin -Mucinex  Fluid overload  diastolic congestive  heart failure exacerbation: Acute. Patient complains of lower extremity swelling.  On physical exam found to have at least 1+ pitting edema.  Chest x-ray noting concern for bilateral pleural effusions.  BNP elevated at 1198.8. Last EF noted to be 55-60% with impaired relaxation.  Suspecting aspect of fluid overload given patient's poor kidney function. -Strict intake and output -Daily weight -Lasix 80 mg twice daily as recommended by nephrology -Check echocardiogram   Hypertensive urgency: Acute. On admission blood pressure is elevated up to 200/60.  Home medication regimen includes amlodipine 5 mg daily, Coreg 25 mg twice daily, hydralazine 100 mg twice daily, isosorbide mononitrate 60 mg daily -Continue home regimen  Chronic kidney disease stage IV: On admission patient presents with creatinine 2.9 and BUN 55.  Patient's creatinine has fluctuated between 2.5-2.9.  Followed by Dr. Verneda Skill of nephrology in the outpatient setting.  Patient just recently had AV fistula placed on 2. -[facility] nephrology consultative services, we will follow-up for further recommend  Leukocytosis: WBC elevated 11.9 on admission. -Follow-up blood cultures -Recheck CBC in a.m.   Metabolic acidosis  Insulin-dependent diabetes mellitus: -Hypoglycemic protocols -CBGs before every meal with moderate SSI -  SA node dysfunction status post pacemaker  Anemia of chronic disease: On admission. Hemoglobin was noted to be 9.0 g/dL which appears near his baseline.  No reports of bleeding. -Check iron studies in a.m.  Hyperlipidemia: Current medications include atorvastatin 10 mg daily. -Continue current home meds  Prior history of CVA -Continue Plavix and statin  OSA on CPAP -Continue CPAP  DVT prophylaxis: Heparin Code Status: Full Family Communication: Plan of care discussed with patient's wife Disposition Plan: Likely discharge home once medically stable Consults called: Nephrology Admission status: Inpatient, require more than 2 midnight stay given  Norval Morton MD [facility] Pager [phone]   If 7PM-7AM, please contact night-coverage [email or web address] Password [email or web address]  04/20/2020, 5:04 PM

## 2020-04-20 NOTE — ED Notes (Signed)
Pt placed on 2L [place] for comfort.

## 2020-04-20 NOTE — ED Triage Notes (Signed)
Pt brought to ED from home via EMS for shortness of breath, ascites, BLE edema. Patient recently had fistula placed for dialysis but has not started treatment. Pt reports recent fall due to tripping over dog. Pt A&O x 4 on arrival to ED, hypertensive, diaphoretic, 94% on room air.  EMS v/s: 188/78 BP 22 RR 98% 3L [place]

## 2020-04-20 NOTE — H&P (Signed)
History and Physical    David [name] TTS:[id number] DOB: [birth year] DOA: 04/20/2020  Referring MD/NP/PA: Dorie Rank, MD PCP: [name], MD  Consultants: Dr. Verneda Skill nephrology Patient coming from: Home via EMS  Chief Complaint: Shortness of breath  I have personally briefly reviewed patient's old medical records in [place]   HPI: David [name] is a 79 y.o. 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  Patient reports that he has had progressively worsening shortness of breath over the last 1 month, but was acutely worse this morning.  Wife reports that his O2 saturations dropped into the 70s.  Normally patient does not require oxygen at baseline.  He had associated symptoms of chills and sweats.  He had been also having a dry productive cough.  Patient had just recently had AV fistula placed on December 2 in preparation for need of dialysis in the future.  Since the procedure he is having left wrist pain and discomfort.  Approximately 2 weeks ago they increased his torsemide from 40 mg every other day to alternating torsemide 40 and 20 mg daily.  Denies having any recent fever, chest pain, nausea, vomiting, or diarrhea.  ED Course: Upon admission into the emergency department patient was seen to be afebrile, respirations 16-23, blood pressure elevated up to 200/60, and O2 saturation currently maintained on 2 L nasal cannula oxygen greater than 91%.  Labs significant for WBC 11.9, hemoglobin 9, BUN 35, creatinine 2.9, and BNP 1198.  Chest x-ray showed left lower lobe airspace disease atelectasis with potentially small bilateral pleural effusions.  X-rays of the left wrist showed only degenerative changes.  Patient had been given empiric antibiotics of Rocephin, azithromycin, and furosemide 40 mg IV.  Review of Systems   Constitutional: Positive for chills and diaphoresis. Negative for fever.  HENT: Negative for ear discharge and nosebleeds.   Eyes: Negative for blurred vision and discharge.  Respiratory: Positive for cough and shortness of breath.   Cardiovascular: Positive for orthopnea and leg swelling. Negative for chest pain.  Gastrointestinal: Negative for abdominal pain, nausea and vomiting.  Genitourinary: Negative for dysuria and hematuria.  Musculoskeletal: Positive for joint pain.  Neurological: Negative for focal weakness and loss of consciousness.  Psychiatric/Behavioral: Negative for memory loss and substance abuse.    Past Medical History:  Diagnosis Date  . Acute cholecystitis s/p lap cholecystectomy 03/05/2017 03/04/2017  . Anemia, iron deficiency   . Anxiety   . Arthritis   . Atrial fibrillation with rapid ventricular response ([place])   . BPH (benign prostatic hypertrophy)   . CAD (coronary artery disease)    Nonobstructive CAD per cath  . Cardiac pacemaker in situ   . CHF (congestive heart failure) ([place])   . Chronic ulcer of right foot ([place])    resolved per patient  . CKD (chronic kidney disease) stage 4, GFR 15-29 ml/min (HCC) 09/26/2017  . CKD (chronic kidney disease), stage III ([place]) secondary to DM and HTN   nephrologist-  [name]  . Coronary artery disease involving native coronary artery of native heart without angina pectoris   . Depression   . Diastolic dysfunction   . Dyspnea    with exertion, no oxygen  . 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 Hearing loss    wears hearing aids  .  History of cellulitis    right great toe 10-25-2014  . History of skin cancer   . HOCM (hypertrophic obstructive cardiomyopathy) ([place])   . Hyperlipidemia associated with type 2 diabetes mellitus ([place]) 10/25/2013  . Hypertension   . Hypertension associated with diabetes ([place]) 10/25/2013  . IDDM (insulin dependent diabetes mellitus) - on  insulin pump 03/04/2017  . Insulin dependent type 2 diabetes mellitus ([place]) 1991   followd by [name]--  has insulin pump  . Insulin pump in place    Patient does not have insulin pump - removed approx 2 yrs ago 2019  . Memory loss    mild per patient  . OSA on CPAP   . Pacemaker 06/03/2015   Medtronic  . Peripheral neuropathy    severe - feet/lower legs  . Peripheral vascular disease ([place])    bilateral lower extremities  . PVD (peripheral vascular disease) ([place])   . Rib fracture 07/24/2015  . Seasonal and perennial allergic rhinitis 10/25/2013  . Seborrheic keratosis 09/19/2016  . Secondary hyperparathyroidism of renal origin ([place])   . 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 Sinus node dysfunction (HCC)   . Sleep apnea   . Syncope 07/24/2015  . Walker as ambulation aid     Past Surgical History:  Procedure Laterality Date  . 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 AMPUTATION TOE Right 03/12/2015   Procedure: RIGHT HALLUS AMPUTATION ;  Surgeon: [name], MD;  Location: [facility];  Service: Podiatry;  Laterality: Right;  . AV FISTULA PLACEMENT Left 04/16/2020   Procedure: CREATION OF LEFT Brachial/Cephalic Arteriovenous Fistula.;  Surgeon: Cherre Robins, MD;  Location: [facility];  Service: Vascular;  Laterality: Left;  . CARDIAC CATHETERIZATION  11-25-2010   [place], [place]   Nonobstructive CAD  . CARDIAC PACEMAKER PLACEMENT  Nov 2009   Medtronic  . CHOLECYSTECTOMY N/A 03/05/2017   Procedure: LAPAROSCOPIC CHOLECYSTECTOMY WITH INTRAOPERATIVE CHOLANGIOGRAM;  Surgeon: [name], MD;  Location: [facility];  Service: General;  Laterality: N/A;  . EP IMPLANTABLE DEVICE N/A 06/03/2015   Procedure: PPM Generator Changeout;  Surgeon: [name], MD;  Location: [facility];  Service: Cardiovascular;  Laterality: N/A;  . EXCISION BONE CYST Right 03/06/2015   Procedure: BONE BIOPSIES OF RIGHT FOOT;  Surgeon: [name], MD;   Location: [facility];  Service: Podiatry;  Laterality: Right;  . EYE SURGERY Bilateral   . KNEE ARTHROSCOPY    . ORIF ANKLE FRACTURE Left 11/06/2014   Procedure: OPEN REDUCTION INTERNAL FIXATION (ORIF) LEFT  ANKLE FRACTURE;  Surgeon: [name], MD;  Location: [facility];  Service: Orthopedics;  Laterality: Left;  . TEE WITHOUT CARDIOVERSION N/A 09/01/2017   Procedure: TRANSESOPHAGEAL ECHOCARDIOGRAM (TEE);  Surgeon: [name], MD;  Location: [facility];  Service: Cardiovascular;  Laterality: N/A;  . TOTAL KNEE ARTHROPLASTY     patient denies this surgery  . VEIN LIGATION AND STRIPPING       reports that he quit smoking about 36 years ago. He has a 60.00 pack-year smoking history. He has never used smokeless tobacco. He reports current alcohol use of about 1.0 standard drink of alcohol per week. He reports that he does not use drugs.  Allergies  Allergen Reactions  . Ativan [Lorazepam] Anxiety    Pt gets more agitated  . Adhesive [Tape] Other (See Comments)    blisters    Family History  Problem Relation Age  of Onset  . Cancer Mother        breast  . Heart attack Father   . Stroke Father     Prior to Admission medications   Medication Sig Start Date End Date Taking? Authorizing Provider  acetaminophen (TYLENOL 8 HOUR ARTHRITIS PAIN) 650 MG CR tablet Take 650 mg by mouth every 8 (eight) hours as needed for pain.    [provider]  amLODipine (NORVASC) 5 MG tablet TAKE 1 TABLET BY MOUTH EVERY DAY Patient taking differently: Take 5 mg by mouth daily.  01/24/20   [name], MD  atorvastatin (LIPITOR) 10 MG tablet TAKE 1 TABLET BY MOUTH EVERY DAY AT 6PM Patient taking differently: Take 10 mg by mouth at bedtime.  01/24/20   [name], MD  blood glucose meter kit and supplies KIT Dispense based on patient and insurance preference. Use up to four times daily as directed. (FOR ICD-9 250.00, 250.01). 01/12/19   Elodia Florence.,  MD  calcitRIOL (ROCALTROL) 0.25 MCG capsule Take 1 capsule by mouth once daily Patient taking differently: Take 0.25 mcg by mouth daily.  09/13/18   [name], MD  carvedilol (COREG) 25 MG tablet TAKE 1 TABLET BY MOUTH TWICE DAILY WITH A MEAL 04/13/20   [name], MD  cholecalciferol (VITAMIN D) 1000 units tablet Take 1 tablet (1,000 Units total) by mouth daily. 02/01/17   [name], MD  cholestyramine light (PREVALITE) 4 g packet Take 4 g by mouth daily.    [provider]  clopidogrel (PLAVIX) 75 MG tablet TAKE 1 TABLET BY MOUTH EVERY DAY 04/13/20   [name], MD  folic acid (FOLVITE) 1 MG tablet Take 1 mg by mouth daily.    [provider]  glucose blood test strip Use Contour test strips as instructed to check blood sugar four times daily. 10/25/19   Elayne Snare, MD  hydrALAZINE (APRESOLINE) 100 MG tablet TAKE 1 TABLET BY MOUTH TWICE A DAY 04/13/20   [name], MD  insulin aspart (NOVOLOG FLEXPEN) 100 UNIT/ML FlexPen INJECT 10 UNITS SUBCUTANEOUSLY BEFORE THE FIRST MEAL OF THE DAY AND 12 UNITS AT [facility] Patient taking differently: Inject 1,012 Units into the skin See admin instructions. INJECT 10 UNITS SUBCUTANEOUSLY BEFORE THE FIRST MEAL OF THE DAY AND 12 UNITS AT [facility] 02/11/20   Elayne Snare, MD  Insulin Syringe-Needle U-100 (INSULIN SYRINGE 1CC/31GX5/16") 31G X 5/16" 1 ML MISC 1 each by Does not apply route 3 (three) times daily. Use insulin syringe to inject insulin three times daily. 01/08/19   Elayne Snare, MD  isosorbide mononitrate (IMDUR) 30 MG 24 hr tablet TAKE 2 TABLETS DAILY. PLEASE MAKE OVERDUE APPT WITH DR. Caryl Comes BEFORE ANYMORE REFILLS. 1ST ATTEMPT Patient taking differently: Take 60 mg by mouth daily.  02/03/20   [name], MD  Omega-3 Fatty Acids (FISH OIL) 1000 MG CAPS Take 1 capsule (1,000 mg total) by mouth every morning. 03/06/19   Garvin Fila, MD  pantoprazole (PROTONIX) 40 MG tablet TAKE 1 TABLET BY MOUTH EVERY DAY 04/13/20    [name], MD  sertraline (ZOLOFT) 100 MG tablet TAKE 1 TABLET BY MOUTH EVERY DAY 04/13/20   [name], MD  torsemide (DEMADEX) 20 MG tablet TAKE 2 TABS EVERY OTHER DAY. PLEASE MAKE OVERDUE APPT WITH DR. Caryl Comes BEFORE ANYMORE REFILLS. Patient taking differently: Take 20 mg by mouth See admin instructions. Takes 2 tablets one day, then alternates with 1 tablet  the next day 02/18/20   Sueanne Margarita, MD  traMADol (ULTRAM) 50 MG tablet Take 1 tablet (50 mg total) by mouth every 12 (twelve) hours as needed for moderate pain. 04/16/20   Dagoberto Ligas, PA-C  TRESIBA FLEXTOUCH 200 UNIT/ML FlexTouch Pen INJECT 44 UNITS SUBCUTANEOUSLY ONCE DAILY Patient taking differently: Inject 44 Units into the skin at bedtime. INJECT 44 UNITS SUBCUTANEOUSLY ONCE DAIL 03/02/20   Elayne Snare, MD  VICTOZA 18 MG/3ML SOPN INJECT 0.3ML (1.8MG TOTAL) INTO THE SKIN DAILY BEFORE SUPPER Patient taking differently: Inject 1.8 mg into the skin daily at 12 noon.  08/26/19   Elayne Snare, MD  vitamin B-12 (CYANOCOBALAMIN) 100 MCG tablet Take 100 mcg by mouth daily.    [provider]    Physical Exam:  Constitutional: Obese male who appears to be in some respiratory Vitals:   04/20/20 1307 04/20/20 1500 04/20/20 1600 04/20/20 1627  BP: (!) 200/60 (!) 168/58 (!) 170/45 (!) 181/66  Pulse: 61 61 61 64  Resp: (!) 21 (!) 23 16 (!) 22  Temp: 98.9 F (37.2 C)   98.9 F (37.2 C)  TempSrc: Oral   Oral  SpO2: 94% 91% 92% 92%   Eyes: PERRL, lids and conjunctivae normal ENMT: Mucous membranes are moist. Posterior pharynx clear of any exudate or lesions.  Neck: normal, supple, no masses, no thyromegaly Respiratory: Mildly tachypneic with rales and some intermittent crackles appreciated.  Patient currently on 2 L nasal cannula oxygen with O2 saturation maintained. Cardiovascular: Regular rate and rhythm, no murmurs / rubs / gallops. No extremity edema. 2+ pedal pulses. No carotid bruits.  AV fistula of the left  arm. Abdomen: no tenderness, no masses palpated. No hepatosplenomegaly. Bowel sounds positive.  Musculoskeletal: no clubbing / cyanosis.  Decreased range of motion of the left wrist with tenderness to palpation.  Amputations noted of the first and second digit of the right foot. Skin: Healing surgical wounds with no significant erythema.  2 ulcerations noted to the left great toe Neurologic: CN 2-12 grossly intact. Sensation intact, DTR normal. Strength 5/5 in all 4.  Psychiatric: Normal judgment and insight. Alert and oriented x 3. Normal mood.     Labs on Admission: I have personally reviewed following labs and imaging studies  CBC: Recent Labs  Lab 04/15/20 1032 04/16/20 0617 04/20/20 1315 04/20/20 1327  WBC  --   --  11.9*  --   HGB 9.1* 9.9* 9.0* 8.8*  HCT  --  29.0* 28.7* 26.0*  MCV  --   --  99.7  --   PLT  --   --  183  --    Basic Metabolic Panel: Recent Labs  Lab 04/15/20 1100 04/16/20 0617 04/20/20 1315 04/20/20 1327  NA 142 143 139 141  K 3.8 4.1 4.1 4.0  CL 112* 110 108  --   CO2 18*  --  17*  --   GLUCOSE 89 104* 117*  --   BUN 61* 59* 55*  --   CREATININE 3.04* 2.90* 2.90*  --   CALCIUM 9.0  --  9.1  --   PHOS 3.9  --   --   --    GFR: Estimated Creatinine Clearance: 29.7 mL/min (A) (by C-G formula based on SCr of 2.9 mg/dL (H)). Liver Function Tests: Recent Labs  Lab 04/15/20 1100  ALBUMIN 3.0*   No results for input(s): LIPASE, AMYLASE in the last 168 hours. No results for input(s): AMMONIA in the last 168 hours. Coagulation  Profile: No results for input(s): INR, PROTIME in the last 168 hours. Cardiac Enzymes: No results for input(s): CKTOTAL, CKMB, CKMBINDEX, TROPONINI in the last 168 hours. BNP (last 3 results) No results for input(s): PROBNP in the last 8760 hours. HbA1C: No results for input(s): HGBA1C in the last 72 hours. CBG: Recent Labs  Lab 04/16/20 0918  GLUCAP 105*   Lipid Profile: No results for input(s): CHOL, HDL,  LDLCALC, TRIG, CHOLHDL, LDLDIRECT in the last 72 hours. Thyroid Function Tests: No results for input(s): TSH, T4TOTAL, FREET4, T3FREE, THYROIDAB in the last 72 hours. Anemia Panel: No results for input(s): VITAMINB12, FOLATE, FERRITIN, TIBC, IRON, RETICCTPCT in the last 72 hours. Urine analysis:    Component Value Date/Time   COLORURINE STRAW (A) 01/09/2019 1900   APPEARANCEUR CLEAR 01/09/2019 1900   LABSPEC 1.009 01/09/2019 1900   PHURINE 5.0 01/09/2019 1900   GLUCOSEU NEGATIVE 01/09/2019 1900   GLUCOSEU NEGATIVE 03/03/2015 1501   HGBUR NEGATIVE 01/09/2019 1900   BILIRUBINUR NEGATIVE 01/09/2019 1900   KETONESUR NEGATIVE 01/09/2019 1900   PROTEINUR NEGATIVE 01/09/2019 1900   UROBILINOGEN 0.2 03/03/2015 1501   NITRITE NEGATIVE 01/09/2019 1900   LEUKOCYTESUR NEGATIVE 01/09/2019 1900   Sepsis Labs: Recent Results (from the past 240 hour(s))  SARS CORONAVIRUS 2 (TAT 6-24 HRS) Nasopharyngeal Nasopharyngeal Swab     Status: None   Collection Time: 04/14/20  1:52 PM   Specimen: Nasopharyngeal Swab  Result Value Ref Range Status   SARS Coronavirus 2 NEGATIVE NEGATIVE Final    Comment: (NOTE) SARS-CoV-2 target nucleic acids are NOT DETECTED.  The SARS-CoV-2 RNA is generally detectable in upper and lower respiratory specimens during the acute phase of infection. Negative results do not preclude SARS-CoV-2 infection, do not rule out co-infections with other pathogens, and should not be used as the sole basis for treatment or other patient management decisions. Negative results must be combined with clinical observations, patient history, and epidemiological information. The expected result is Negative.  Fact Sheet for Patients: SugarRoll.be  Fact Sheet for Healthcare Providers: https://www.woods-mathews.com/  This test is not yet approved or cleared by the [place] FDA and  has been authorized for detection and/or diagnosis of  SARS-CoV-2 by FDA under an Emergency Use Authorization (EUA). This EUA will remain  in effect (meaning this test can be used) for the duration of the COVID-19 declaration under Se ction 564(b)(1) of the Act, 21 U.S.C. section 360bbb-3(b)(1), unless the authorization is terminated or revoked sooner.  Performed at [facility], [place] [address], [place], [place] [address]   Resp Panel by RT-PCR (Flu A&B, Covid) Nasopharyngeal Swab     Status: None   Collection Time: 04/20/20  1:10 PM   Specimen: Nasopharyngeal Swab; Nasopharyngeal(NP) swabs in vial transport medium  Result Value Ref Range Status   SARS Coronavirus 2 by RT PCR NEGATIVE NEGATIVE Final    Comment: (NOTE) SARS-CoV-2 target nucleic acids are NOT DETECTED.  The SARS-CoV-2 RNA is generally detectable in upper respiratory specimens during the acute phase of infection. The lowest concentration of SARS-CoV-2 viral copies this assay can detect is 138 copies/mL. A negative result does not preclude SARS-Cov-2 infection and should not be used as the sole basis for treatment or other patient management decisions. A negative result may occur with  improper specimen collection/handling, submission of specimen other than nasopharyngeal swab, presence of viral mutation(s) within the areas targeted by this assay, and inadequate number of viral copies(<138 copies/mL). A negative result must be combined with clinical  observations, patient history, and epidemiological information. The expected result is Negative.  Fact Sheet for Patients:  EntrepreneurPulse.com.au  Fact Sheet for Healthcare Providers:  IncredibleEmployment.be  This test is no t yet approved or cleared by the [place] FDA and  has been authorized for detection and/or diagnosis of SARS-CoV-2 by FDA under an Emergency Use Authorization (EUA). This EUA will remain  in effect (meaning this test can be used) for the duration of  the COVID-19 declaration under Section 564(b)(1) of the Act, 21 U.S.C.section 360bbb-3(b)(1), unless the authorization is terminated  or revoked sooner.       Influenza A by PCR NEGATIVE NEGATIVE Final   Influenza B by PCR NEGATIVE NEGATIVE Final    Comment: (NOTE) The Xpert Xpress SARS-CoV-2/FLU/RSV plus assay is intended as an aid in the diagnosis of influenza from Nasopharyngeal swab specimens and should not be used as a sole basis for treatment. Nasal washings and aspirates are unacceptable for Xpert Xpress SARS-CoV-2/FLU/RSV testing.  Fact Sheet for Patients: EntrepreneurPulse.com.au  Fact Sheet for Healthcare Providers: IncredibleEmployment.be  This test is not yet approved or cleared by the [place] FDA and has been authorized for detection and/or diagnosis of SARS-CoV-2 by FDA under an Emergency Use Authorization (EUA). This EUA will remain in effect (meaning this test can be used) for the duration of the COVID-19 declaration under Section 564(b)(1) of the Act, 21 U.S.C. section 360bbb-3(b)(1), unless the authorization is terminated or revoked.  Performed at [facility], [place] [address] [place], [place] [address]      Radiological Exams on Admission: DG Wrist Complete Left  Result Date: 04/20/2020 CLINICAL DATA:  Wrist pain procedure for fistula on LEFT arm with pain in LEFT wrist EXAM: LEFT WRIST - COMPLETE 3+ VIEW COMPARISON:  None FINDINGS: Extensive vascular calcifications project over the soft tissues of the LEFT wrist. Extensive radiocarpal joint space narrowing. Mild carpometacarpal degenerative changes of the first carpometacarpal joint. No sign of acute fracture or bone abnormality. IMPRESSION: 1. No acute fracture or dislocation. 2. Degenerative changes about the wrist. 3. Extensive vascular calcifications in the soft tissues of the LEFT wrist. Electronically Signed   By: Zetta Bills [name]   On: 04/20/2020 14:43    DG Chest Port 1 View  Result Date: 04/20/2020 CLINICAL DATA:  Dyspnea, shortness of breath history of lower extremity edema, CHF and hypertension EXAM: PORTABLE CHEST 1 VIEW COMPARISON:  January 10, 2019 FINDINGS: LEFT-sided power pack for dual lead pacer device projects over LEFT chest. Trachea midline. Cardiomediastinal contours and hilar structures are similar to the prior exam with central pulmonary vascular engorgement. Partially obscured LEFT hemidiaphragm. Graded opacity at RIGHT lung base. On limited assessment no acute skeletal process. IMPRESSION: LEFT lower lobe airspace disease, atelectasis or developing infection. 1. Potential small bilateral pleural effusions. Electronically Signed   By: Zetta Bills [name]   On: 04/20/2020 13:53    EKG: Independently reviewed.  Paced rhythm  Assessment/Plan Acute respiratory failure with hypoxia secondary suspected pneumonia and fluid overload: Acute.  Patient presents with complaints of acutely worsening shortness of breath and dry cough.  O2 saturations noted to be as low as 70% at home on room air. Chest x-ray showed concern for possibility of left lower lobe infiltrate with concern for potential small bilateral pleural effusion. -Admit to a medical telemetry bed  -Continuous pulse oximetry with nasal cannula oxygen to maintain O2 saturations. -Follow-up blood cultures -Add on procalcitonin -Continue empiric antibiotics of Rocephin and azithromycin -Mucinex  Fluid  overload Diastolic congestive heart failure exacerbation: Patient complains of lower extremity swelling.  On physical exam found to have at least 1+ pitting edema.  Chest x-ray noting concern for bilateral pleural effusions.  BNP elevated at 1198.8. Last EF noted to be 55-60% with impaired relaxation.  Suspecting aspect of fluid overload given patient's poor kidney function. -Strict intake and output -Daily weight -Lasix 80 mg IV twice daily per nephrology -Check 2D  echo  Hypertensive urgency: On admission blood pressure is elevated up to 200/60.  Home medication regimen includes amlodipine 5 mg daily, Coreg 25 mg twice daily, hydralazine 100 mg twice daily, isosorbide mononitrate 60 mg daily -Continue home regimen -Hydralazine IV as needed  Chronic kidney disease stage IV: On admission patient presents with creatinine 2.9 and BUN 55.  Patient's creatinine has fluctuated between 2.5-2.9.  Followed by Dr. Verneda Skill of nephrology in the outpatient setting.  Patient had also been recently. -[place] nephrology consultative services, we will follow-up for further recommend  Leukocytosis: WBC elevated 11.9 on admission. -Follow-up blood cultures -Recheck CBC in a.m.  Insulin-dependent diabetes mellitus: Last available hemoglobin A1c noted to be 6.5 03/30/2020, but suspect likely inaccurate given anemia. -Hypoglycemic protocols -CBGs before every meal with moderate SSI -Consider restarting home Lantus dose in a.m. once tolerating meals  SA node dysfunction status post pacemaker  Anemia of chronic disease: On admission patient's hemoglobin was noted to be 9.0 g/dL which appears near his baseline.  No reports of bleeding. -Check iron studies in a.m.  Hyperlipidemia: Current medications include atorvastatin 10 mg daily. -Continue current home meds  Prior history of CVA -Continue Plavix and statin  OSA on CPAP -Continue CPAP  DVT prophylaxis: Heparin Code Status: Full Family Communication: Plan of care discussed with patient's wife Disposition Plan: Likely discharge home once medically stable Consults called: Nephrology Admission status: Inpatient, require more than 2 midnight stay given  Norval Morton MD [facility] Pager [phone]   If 7PM-7AM, please contact night-coverage [email or web address] Password [email or web address]  04/20/2020, 5:04 PM

## 2020-04-20 NOTE — Telephone Encounter (Signed)
kecia from home health stated patient is having trouble breathing and oxygen level is at 88% and patient is going to ER and just wanted to inform us what is happening with patient

## 2020-04-20 NOTE — Telephone Encounter (Signed)
FYI

## 2020-04-20 NOTE — ED Notes (Signed)
Dinner Tray Ordered @ 1727. °

## 2020-04-20 NOTE — Consult Note (Signed)
Nephrology Consult  [facility]  Requesting provider: Norval Morton, MD  Assessment/Recommendations: David [name] is a/an 79 y.o. 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 Marval Regal ([facility]) -Baseline Cr seems to range around 2.5-3 -if there is no adequate diuresis with medical management then would consider initiating hemodialysis in-house but no indications at the moment.  I did discuss this with him and he is okay with dialysis initiation if needed -Continue to monitor daily Cr, Dose meds for GFR<15 -Monitor Daily I/Os, Daily weight  -Maintain MAP>65 for optimal renal perfusion.  -Agree with holding ACE-I, avoid further nephrotoxins including NSAIDS, Morphine.  Unless absolutely necessary, avoid CT with contrast and/or MRI with gadolinium.  Hypoxic Respiratory Distress, secondary to volume overload -another dose of Lasix 105m (total 826m, would keep on lasix 8047mV BID to start with -would recommend repeating 2D Echo    Hypertension: -elevated on presentation, likely related to SOB/respiratory distress -resume home anti-HTNs, should also be getting benefit with diuresis  Metabolic Acidosis, secondary to advanced CKD -start nahco3 1300m49mD  Anemia due to chronic kidney disease: -received aranesp 60mc44m 12/1. Receiving qmonthly -check iron panel (has received feraheme in Jan 2021) -transfuse for hgb <7  Diabetes Mellitus Type 2 with Hyperglycemia -management per primary service, hba1c previously low but not true a1c in the context of anemia  Secondary Hyperparathyroidism -resume home calcitriol, check phos  Access: -s/p LUE BC AVF 04/16/2020 with Dr. HawkiLuan Pullingre was an initial concern for steal syndrome which may be mild, can follow with VVS outpatient unless pain worsens -LUE precautions   Recommendations conveyed to primary service.    [facility] 04/20/2020 5:57 PM   _____________________________________________________________________________________   History of Present Illness: David [name]/an 79 y.39 male with a past medical history of CKD4 (following with Dr. ColadMarval RegalN, DM2 (insulin pump), HOCM, SSS s/p PPM, pAfib, OSA on CPAP, nonobstructive CAD, PVD, h/o CVA, HLD who [facility] SOB. Torsemide recently increased to 40mg 26my other day alternating with 20mg. 68mfound to have O2 sat in the 70s this AM.  In ER: CXR performed which revealed central pulmonary vascular engorgement left lower lobe airspace disease, potential small bilateral pleural effusions.  Received Lasix 40 mg IV x1 dose and then received another 40 mg after my discussion with ER.  Was placed on O2 nasal cannula and respiratory status improved (also now maintaining sats around 95%). Has been dealing with left wrist pain since AVF placement. Otherwise, denies any fevers, chills, chest pain, orthostatic dizziness, changes in urinary frequency, nausea/vomiting, dysgeusia, brain fog, loss of appetite.   Medications:  Current Facility-Administered Medications  Medication Dose Route Frequency Provider Last Rate Last Admin  . acetaminophen (TYLENOL) tablet 650 mg  650 mg Oral Q6H PRN Smith, Norval Morton    Or  . acetaminophen (TYLENOL) suppository 650 mg  650 mg Rectal Q6H PRN Smith, Rondell A, MD      . albuterol (PROVENTIL) (2.5 MG/3ML) 0.083% nebulizer solution 2.5 mg  2.5 mg Nebulization Q6H PRN Smith, Rondell A, MD      . guaiFENesin (MUCINEX) 12 hr tablet 600 mg  600 mg Oral BID Smith, Rondell A, MD      . heparin injection 5,000 Units  5,000 Units Subcutaneous Q8H Smith, Norval Morton   .  ondansetron (ZOFRAN) tablet 4 mg  4 mg Oral Q6H PRN Fuller Plan A, MD       Or  . ondansetron (ZOFRAN) injection 4 mg  4 mg Intravenous Q6H PRN Smith, Rondell A, MD      . sodium  chloride flush (NS) 0.9 % injection 3 mL  3 mL Intravenous Q12H Norval Morton, MD       Current Outpatient Medications  Medication Sig Dispense Refill  . acetaminophen (TYLENOL 8 HOUR ARTHRITIS PAIN) 650 MG CR tablet Take 650 mg by mouth every 8 (eight) hours as needed for pain.    Marland Kitchen amLODipine (NORVASC) 5 MG tablet TAKE 1 TABLET BY MOUTH EVERY DAY (Patient taking differently: Take 5 mg by mouth daily. ) 90 tablet 1  . atorvastatin (LIPITOR) 10 MG tablet TAKE 1 TABLET BY MOUTH EVERY DAY AT 6PM (Patient taking differently: Take 10 mg by mouth at bedtime. ) 90 tablet 1  . blood glucose meter kit and supplies KIT Dispense based on patient and insurance preference. Use up to four times daily as directed. (FOR ICD-9 250.00, 250.01). 1 each 0  . calcitRIOL (ROCALTROL) 0.25 MCG capsule Take 1 capsule by mouth once daily (Patient taking differently: Take 0.25 mcg by mouth daily. ) 90 capsule 0  . carvedilol (COREG) 25 MG tablet TAKE 1 TABLET BY MOUTH TWICE DAILY WITH A MEAL 180 tablet 0  . cholecalciferol (VITAMIN D) 1000 units tablet Take 1 tablet (1,000 Units total) by mouth daily. 30 tablet 1  . cholestyramine light (PREVALITE) 4 g packet Take 4 g by mouth daily.    . clopidogrel (PLAVIX) 75 MG tablet TAKE 1 TABLET BY MOUTH EVERY DAY 90 tablet 0  . folic acid (FOLVITE) 1 MG tablet Take 1 mg by mouth daily.    Marland Kitchen glucose blood test strip Use Contour test strips as instructed to check blood sugar four times daily. 200 each 2  . hydrALAZINE (APRESOLINE) 100 MG tablet TAKE 1 TABLET BY MOUTH TWICE A DAY 180 tablet 0  . insulin aspart (NOVOLOG FLEXPEN) 100 UNIT/ML FlexPen INJECT 10 UNITS SUBCUTANEOUSLY BEFORE THE FIRST MEAL OF THE DAY AND 12 UNITS AT DINNER (Patient taking differently: Inject 1,012 Units into the skin See admin instructions. INJECT 10 UNITS SUBCUTANEOUSLY BEFORE THE FIRST MEAL OF THE DAY AND 12 UNITS AT DINNER) 15 mL 1  . Insulin Syringe-Needle U-100 (INSULIN SYRINGE 1CC/31GX5/16") 31G X  5/16" 1 ML MISC 1 each by Does not apply route 3 (three) times daily. Use insulin syringe to inject insulin three times daily. 100 each 2  . isosorbide mononitrate (IMDUR) 30 MG 24 hr tablet TAKE 2 TABLETS DAILY. PLEASE MAKE OVERDUE APPT WITH DR. Caryl Comes BEFORE ANYMORE REFILLS. 1ST ATTEMPT (Patient taking differently: Take 60 mg by mouth daily. ) 180 tablet 3  . Omega-3 Fatty Acids (FISH OIL) 1000 MG CAPS Take 1 capsule (1,000 mg total) by mouth every morning. 30 capsule 1  . pantoprazole (PROTONIX) 40 MG tablet TAKE 1 TABLET BY MOUTH EVERY DAY 90 tablet 0  . sertraline (ZOLOFT) 100 MG tablet TAKE 1 TABLET BY MOUTH EVERY DAY 90 tablet 1  . torsemide (DEMADEX) 20 MG tablet TAKE 2 TABS EVERY OTHER DAY. PLEASE MAKE OVERDUE APPT WITH DR. Caryl Comes BEFORE ANYMORE REFILLS. (Patient taking differently: Take 20 mg by mouth See admin instructions. Takes 2 tablets one day, then alternates with 1 tablet the next day) 90 tablet 3  . traMADol (ULTRAM) 50 MG tablet Take 1 tablet (50  mg total) by mouth every 12 (twelve) hours as needed for moderate pain. 10 tablet 0  . TRESIBA FLEXTOUCH 200 UNIT/ML FlexTouch Pen INJECT 44 UNITS SUBCUTANEOUSLY ONCE DAILY (Patient taking differently: Inject 44 Units into the skin at bedtime. INJECT 44 UNITS SUBCUTANEOUSLY ONCE DAIL) 9 mL 2  . VICTOZA 18 MG/3ML SOPN INJECT 0.3ML (1.8MG TOTAL) INTO THE SKIN DAILY BEFORE SUPPER (Patient taking differently: Inject 1.8 mg into the skin daily at 12 noon. ) 9 mL 2  . vitamin B-12 (CYANOCOBALAMIN) 100 MCG tablet Take 100 mcg by mouth daily.       ALLERGIES Ativan [lorazepam] and Adhesive [tape]  MEDICAL HISTORY Past Medical History:  Diagnosis Date  . Acute cholecystitis s/p lap cholecystectomy 03/05/2017 03/04/2017  . Anemia, iron deficiency   . Anxiety   . Arthritis   . Atrial fibrillation with rapid ventricular response ([place])   . BPH (benign prostatic hypertrophy)   . CAD (coronary artery disease)    Nonobstructive CAD per cath  .  Cardiac pacemaker in situ   . CHF (congestive heart failure) ([place])   . Chronic ulcer of right foot ([place])    resolved per patient  . CKD (chronic kidney disease) stage 4, GFR 15-29 ml/min (HCC) 09/26/2017  . CKD (chronic kidney disease), stage III ([place]) secondary to DM and HTN   nephrologist-  [name]  . Coronary artery disease involving native coronary artery of native heart without angina pectoris   . Depression   . Diastolic dysfunction   . Dyspnea    with exertion, no oxygen  . 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 Hearing loss    wears hearing aids  . History of cellulitis    right great toe 10-25-2014  . History of skin cancer   . HOCM (hypertrophic obstructive cardiomyopathy) ([place])   . Hyperlipidemia associated with type 2 diabetes mellitus ([place]) 10/25/2013  . Hypertension   . Hypertension associated with diabetes ([place]) 10/25/2013  . 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 Insulin dependent type 2 diabetes mellitus ([place]) 1991   followd by [name]--  has insulin pump  . Insulin pump in place    Patient does not have insulin pump - removed approx 2 yrs ago 2019  . Memory loss    mild per patient  . OSA on CPAP   . Pacemaker 06/03/2015   Medtronic  . Peripheral neuropathy    severe - feet/lower legs  . Peripheral vascular disease ([place])    bilateral lower extremities  . PVD (peripheral vascular disease) ([place])   . Rib fracture 07/24/2015  . Seasonal and perennial allergic rhinitis 10/25/2013  . Seborrheic keratosis 09/19/2016  . Secondary hyperparathyroidism of renal origin ([place])   . 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 Sinus node dysfunction (HCC)   . Sleep apnea   . Syncope 07/24/2015  . Walker as ambulation aid      SOCIAL HISTORY Social History   Socioeconomic History  . Marital status: Married    Spouse name: Not on file  . Number of children: 3  . Years of education: 87  . Highest  education level: Not on file  Occupational History  . Occupation: Retired  Tobacco Use  . Smoking status: Former Smoker    Packs/day: 2.00    Years: 30.00    Pack years: 60.00    Quit date: 03/03/1984    Years since quitting: 36.1  . Smokeless  tobacco: Never Used  Vaping Use  . Vaping Use: Never used  Substance and Sexual Activity  . Alcohol use: Yes    Alcohol/week: 1.0 standard drink    Types: 1 Glasses of wine per week    Comment: social 1-2 times/month  . Drug use: No  . Sexual activity: Not Currently  Other Topics Concern  . Not on file  Social History Narrative   Currently resides with his wife. 1 dog. Fun/Hobby: Golf    Denies any religious beliefs effecting health care.    Social Determinants of Health   Financial Resource Strain:   . Difficulty of Paying Living Expenses: Not on file  Food Insecurity:   . Worried About Charity fundraiser in the Last Year: Not on file  . Ran Out of Food in the Last Year: Not on file  Transportation Needs:   . Lack of Transportation (Medical): Not on file  . Lack of Transportation (Non-Medical): Not on file  Physical Activity:   . Days of Exercise per Week: Not on file  . Minutes of Exercise per Session: Not on file  Stress:   . Feeling of Stress : Not on file  Social Connections:   . Frequency of Communication with Friends and Family: Not on file  . Frequency of Social Gatherings with Friends and Family: Not on file  . Attends Religious Services: Not on file  . Active Member of Clubs or Organizations: Not on file  . Attends Archivist Meetings: Not on file  . Marital Status: Not on file  Intimate Partner Violence:   . Fear of Current or Ex-Partner: Not on file  . Emotionally Abused: Not on file  . Physically Abused: Not on file  . Sexually Abused: Not on file     FAMILY HISTORY Family History  Problem Relation Age of Onset  . Cancer Mother        breast  . Heart attack Father   . Stroke Father       Review of Systems: 12 systems reviewed Otherwise as per HPI, all other systems reviewed and negative  Physical Exam: Vitals:   04/20/20 1715 04/20/20 1730  BP: (!) 182/62 (!) 185/59  Pulse: 60 62  Resp: 19 15  Temp:    SpO2: 97% 97%   No intake/output data recorded. No intake or output data in the 24 hours ending 04/20/20 1757 General: well-appearing, no acute distress HEENT: anicteric sclera, oropharynx clear without lesions CV: regular rate, normal rhythm, no murmurs, no gallops, no rubs Lungs: Fine bibasilar crackles, slightly labored otherwise speaking full sentences, bilateral chest expansion Abd: Obese, soft, non-tender, non-distended Psych: alert, engaged, appropriate mood and affect Musculoskeletal: chronic stasis changes bilateral LE's, trace pitting edema bilateral LE's, +toe amputations, 1+ left radial pulse, left hand warm to touch, faint bruit+thrill LUE AVF (incisions c/d/i) Neuro: normal speech, no gross focal deficits   Test Results Reviewed Lab Results  Component Value Date   NA 141 04/20/2020   K 4.0 04/20/2020   CL 108 04/20/2020   CO2 17 (L) 04/20/2020   BUN 55 (H) 04/20/2020   CREATININE 2.90 (H) 04/20/2020   GFR 21.25 (L) 03/30/2020   GLU 255 05/24/2018   CALCIUM 9.1 04/20/2020   ALBUMIN 3.0 (L) 04/15/2020   PHOS 3.9 04/15/2020     I have reviewed all relevant outside healthcare records related to the patient's kidney injury.

## 2020-04-21 ENCOUNTER — Other Ambulatory Visit: Payer: Self-pay | Admitting: *Deleted

## 2020-04-21 ENCOUNTER — Inpatient Hospital Stay (HOSPITAL_COMMUNITY): Payer: Medicare Other

## 2020-04-21 DIAGNOSIS — I5031 Acute diastolic (congestive) heart failure: Secondary | ICD-10-CM

## 2020-04-21 DIAGNOSIS — I509 Heart failure, unspecified: Secondary | ICD-10-CM

## 2020-04-21 DIAGNOSIS — J189 Pneumonia, unspecified organism: Secondary | ICD-10-CM

## 2020-04-21 LAB — ECHOCARDIOGRAM COMPLETE
AR max vel: 2.71 cm2
AV Area VTI: 3.09 cm2
AV Area mean vel: 2.55 cm2
AV Mean grad: 9 mmHg
AV Peak grad: 14.7 mmHg
Ao pk vel: 1.92 m/s
Area-P 1/2: 2.87 cm2
Calc EF: 65.1 %
S' Lateral: 3.7 cm
Single Plane A2C EF: 61.7 %
Single Plane A4C EF: 68.4 %

## 2020-04-21 LAB — RENAL FUNCTION PANEL
Albumin: 2.6 g/dL — ABNORMAL LOW (ref 3.5–5.0)
Anion gap: 13 (ref 5–15)
BUN: 61 mg/dL — ABNORMAL HIGH (ref 8–23)
CO2: 20 mmol/L — ABNORMAL LOW (ref 22–32)
Calcium: 8.9 mg/dL (ref 8.9–10.3)
Chloride: 106 mmol/L (ref 98–111)
Creatinine, Ser: 3.07 mg/dL — ABNORMAL HIGH (ref 0.61–1.24)
GFR, Estimated: 20 mL/min — ABNORMAL LOW (ref >60)
Glucose, Bld: 188 mg/dL — ABNORMAL HIGH (ref 70–99)
Phosphorus: 4.5 mg/dL (ref 2.5–4.6)
Potassium: 3.9 mmol/L (ref 3.5–5.1)
Sodium: 139 mmol/L (ref 135–145)

## 2020-04-21 LAB — CBC
HCT: 25.9 % — ABNORMAL LOW (ref 39.0–52.0)
Hemoglobin: 8.2 g/dL — ABNORMAL LOW (ref 13.0–17.0)
MCH: 30.7 pg (ref 26.0–34.0)
MCHC: 31.7 g/dL (ref 30.0–36.0)
MCV: 97 fL (ref 80.0–100.0)
Platelets: 170 10*3/uL (ref 150–400)
RBC: 2.67 MIL/uL — ABNORMAL LOW (ref 4.22–5.81)
RDW: 14.5 % (ref 11.5–15.5)
WBC: 11 10*3/uL — ABNORMAL HIGH (ref 4.0–10.5)
nRBC: 0 % (ref 0.0–0.2)

## 2020-04-21 LAB — GLUCOSE, CAPILLARY
Glucose-Capillary: 115 mg/dL — ABNORMAL HIGH (ref 70–99)
Glucose-Capillary: 159 mg/dL — ABNORMAL HIGH (ref 70–99)
Glucose-Capillary: 114 mg/dL — ABNORMAL HIGH (ref 70–99)
Glucose-Capillary: 100 mg/dL — ABNORMAL HIGH (ref 70–99)

## 2020-04-21 LAB — IRON AND TIBC
Iron: 24 ug/dL — ABNORMAL LOW (ref 45–182)
Saturation Ratios: 15 % — ABNORMAL LOW (ref 17.9–39.5)
TIBC: 157 ug/dL — ABNORMAL LOW (ref 250–450)
UIBC: 133 ug/dL

## 2020-04-21 LAB — FERRITIN: Ferritin: 542 ng/mL — ABNORMAL HIGH (ref 24–336)

## 2020-04-21 MED ORDER — PERFLUTREN LIPID MICROSPHERE
1.0000 mL | INTRAVENOUS | Status: AC | PRN
  Administered 2020-04-21: 2 mL via INTRAVENOUS
  Filled 2020-04-21: qty 10

## 2020-04-21 MED ORDER — SODIUM CHLORIDE 0.9 % IV SOLN
510.0000 mg | Freq: Once | INTRAVENOUS | Status: AC
  Administered 2020-04-21: 510 mg via INTRAVENOUS
  Filled 2020-04-21: qty 17

## 2020-04-21 NOTE — Progress Notes (Signed)
ReDS Clip Diuretic Study Pt study # [id number]  Your patient is in the Blinded arm of the ReDS Clip Diuretic study.  Your patient has had a ReDS reading and the reading has been transmitted to the cloud.   Thank You   The research team   Kerby Nora, PharmD, BCPS Heart Failure Stewardship Pharmacist Phone [phone]  Please check [email or web address] for unit-specific pharmacist phone numbers

## 2020-04-21 NOTE — Patient Outreach (Signed)
[name]  04/21/2020  David [name] July 14, 1940 [id number]   RN Health CoachHospitalization  Referral Date: 04/15/2019 Referral Source: Transfer from [place] Reason for Referral: Continued Disease Management Education Insurance:Medicare   Outreach Attempt:  Received notification patient admitted to [facility] for shortness of breath.  Recent AV fistula placement.  [name] notified [name] of patient's admission.   Plan:  [name] will await Hospital Liaisons recommendations for discharge follow up.  [name] [phone] [name]

## 2020-04-21 NOTE — Progress Notes (Signed)
David [name] Progress Note   79 y.o. male with  CKD4 following with Dr. Marval Regal) HTN, DM2 (insulin pump), HOCM, SSS s/p PPM, pAfib, OSA on CPAP, nonobstructive CAD, PVD, h/o CVA, HLD who presents to [facility] with progressively worsening SOB. Torsemide recently increased to 40mg  every other day alternating with 20mg .   Has been dealing with left wrist pain since AVF placement.  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 Marval Regal ([place]) with a baseline Cr ~2.5-3 -if there is no adequate diuresis with medical management then would consider initiating hemodialysis in-house but no indications at the moment.  I did discuss this with him and he is okay with dialysis initiation if needed -Continue to monitor daily Cr, Dose meds for GFR<15 -Monitor Daily I/Os, Daily weights  -Maintain MAP>65 for optimal renal perfusion.  -Agree with holding ACE-I, avoid further nephrotoxins including NSAIDS, Morphine.  Unless absolutely necessary, avoid CT with contrast and/or MRI with gadolinium. - About to be given another Lasix 80mg  IV; continue Lasix 80mg  IV BID  Hypoxic Respiratory Distress, secondary to volume overload -another dose of Lasix 40mg  (total 80mg ), would keep on lasix 80mg  IV BID to start with -would recommend repeating 2D Echo    Hypertension: -elevated on presentation, likely related to SOB/respiratory distress -resume home anti-HTNs, should also be getting benefit with diuresis  Metabolic Acidosis, secondary to advanced CKD -start nahco3 1300mg  TID  Anemia due to chronic kidney disease: -received aranesp 87mcg on 12/1. Receiving qmonthly -check iron panel (has received feraheme in Jan 2021) -> will give another dose of Feraheme -transfuse for hgb <7  Diabetes Mellitus Type 2 with Hyperglycemia -management per primary service, hba1c previously low but not true a1c in the context of anemia  Secondary  Hyperparathyroidism -resume home calcitriol, check phos  Access: -s/p LUE BC AVF 04/16/2020 with Dr. Luan Pulling, there was an initial concern for steal syndrome which may be mild, can follow with VVS outpatient unless pain worsens. Appears to be more wrist/ musculoskelelal than steal. Hand is warm. -LUE precautions   Subjective:   Main complaint is the left wrist pain.  Denies f/c/n/v.    Objective:   BP (!) 161/50 (BP Location: Right Arm)   Pulse (!) 59   Temp 98.1 F (36.7 C) (Oral)   Resp 19   SpO2 97%   Intake/Output Summary (Last 24 hours) at 04/21/2020 2725 Last data filed at 04/21/2020 0900 Gross per 24 hour  Intake 470 ml  Output 500 ml  Net -30 ml   Weight change:   Physical Exam: GEN: NAD, A&Ox3, NCAT HEENT: No conjunctival pallor, EOMI NECK: Supple, no thyromegaly LUNGS: CTA B/L no rales, rhonchi or wheezing CV: RRR, No M/R/G ABD: SNDNT +BS  EXT: 1+ lower extremity edema    Imaging: DG Wrist Complete Left  Result Date: 04/20/2020 CLINICAL DATA:  Wrist pain procedure for fistula on LEFT arm with pain in LEFT wrist EXAM: LEFT WRIST - COMPLETE 3+ VIEW COMPARISON:  None FINDINGS: Extensive vascular calcifications project over the soft tissues of the LEFT wrist. Extensive radiocarpal joint space narrowing. Mild carpometacarpal degenerative changes of the first carpometacarpal joint. No sign of acute fracture or bone abnormality. IMPRESSION: 1. No acute fracture or dislocation. 2. Degenerative changes about the wrist. 3. Extensive vascular calcifications in the soft tissues of the LEFT wrist. Electronically Signed   By: Zetta Bills [name]   On: 04/20/2020 14:43   DG Chest [facility]  1 View  Result Date: 04/20/2020 CLINICAL DATA:  Dyspnea, shortness of breath history of lower extremity edema, CHF and hypertension EXAM: PORTABLE CHEST 1 VIEW COMPARISON:  January 10, 2019 FINDINGS: LEFT-sided power pack for dual lead pacer device projects over LEFT chest. Trachea midline.  Cardiomediastinal contours and hilar structures are similar to the prior exam with central pulmonary vascular engorgement. Partially obscured LEFT hemidiaphragm. Graded opacity at RIGHT lung base. On limited assessment no acute skeletal process. IMPRESSION: LEFT lower lobe airspace disease, atelectasis or developing infection. 1. Potential small bilateral pleural effusions. Electronically Signed   By: Zetta Bills [name]   On: 04/20/2020 13:53    Labs: BMET Recent Labs  Lab 04/15/20 1100 04/16/20 0617 04/20/20 1315 04/20/20 1327 04/21/20 0115  NA 142 143 139 141 139  K 3.8 4.1 4.1 4.0 3.9  CL 112* 110 108  --  106  CO2 18*  --  17*  --  20*  GLUCOSE 89 104* 117*  --  188*  BUN 61* 59* 55*  --  61*  CREATININE 3.04* 2.90* 2.90*  --  3.07*  CALCIUM 9.0  --  9.1  --  8.9  PHOS 3.9  --   --   --  4.5   CBC Recent Labs  Lab 04/16/20 0617 04/20/20 1315 04/20/20 1327 04/21/20 0115  WBC  --  11.9*  --  11.0*  HGB 9.9* 9.0* 8.8* 8.2*  HCT 29.0* 28.7* 26.0* 25.9*  MCV  --  99.7  --  97.0  PLT  --  183  --  170    Medications:    . amLODipine  5 mg Oral Daily  . atorvastatin  10 mg Oral QHS  . calcitRIOL  0.25 mcg Oral Daily  . carvedilol  25 mg Oral BID WC  . cholestyramine light  4 g Oral Daily  . clopidogrel  75 mg Oral Daily  . furosemide  80 mg Intravenous BID  . guaiFENesin  600 mg Oral BID  . heparin  5,000 Units Subcutaneous Q8H  . hydrALAZINE  100 mg Oral BID  . insulin aspart  0-15 Units Subcutaneous TID WC  . isosorbide mononitrate  60 mg Oral Daily  . sertraline  100 mg Oral Q supper  . sodium bicarbonate  1,300 mg Oral TID  . sodium chloride flush  3 mL Intravenous Q12H      Otelia Santee, MD 04/21/2020, 9:06 AM

## 2020-04-21 NOTE — TOC Initial Note (Signed)
Transition of Care [facility]) - Initial/Assessment Note    Patient Details  Name: David [name] MRN: [id number] Date of Birth: Jul 21, 1940  [facility] CM/SW Contact:    Carles Collet, RN Phone Number: 04/21/2020, 5:03 PM  Clinical Narrative:      Could not reach patient, spoke w wife. She states that they are active w [facility] for [facility] PT RN, would like to [facility] for assistance w baths. WILL NEED resumption order for [facility] PT RN [facility]HA, and [facility] notified. Wife is requesting hospital bed. Order and note placed. Will need to notify [employer] when office opens tomorrow. Prior to admission patient has RW, is followed by [facility] as well.  TOC will also continue to follow for potential home O2 needs at DC.              Expected Discharge Plan: [place] Barriers to Discharge: Continued Medical Work up   Patient Goals and CMS Choice Patient states their goals for this hospitalization and ongoing recovery are:: to go home CMS Medicare.gov Compare Post Acute Care list provided to:: Other (Comment Required) Choice offered to / list presented to : Spouse  Expected Discharge Plan and Services Expected Discharge Plan: [place]   Discharge Planning Services: CM Consult Post Acute Care Choice: Home Health, Durable Medical Equipment Living arrangements for the past 2 months: Single Family Home                 DME Arranged: Hospital bed         HH Arranged: PT, OT, Nurse's Aide [place] Agency: [place]        Prior Living Arrangements/Services Living arrangements for the past 2 months: Single Family Home Lives with:: Spouse              Current home services: Home PT, Home RN    Activities of Daily Living Home Assistive Devices/Equipment: Environmental consultant (specify type), Dentures (specify type), Eyeglasses (front wheel walker ) ADL Screening (condition at time of admission) Patient's cognitive ability adequate to safely complete daily  activities?: Yes Is the patient deaf or have difficulty hearing?: No Does the patient have difficulty seeing, even when wearing glasses/contacts?: No Does the patient have difficulty concentrating, remembering, or making decisions?: No Patient able to express need for assistance with ADLs?: Yes Does the patient have difficulty dressing or bathing?: No Independently performs ADLs?: Yes (appropriate for developmental age) Does the patient have difficulty walking or climbing stairs?: Yes Weakness of Legs: Both Weakness of Arms/Hands: Left  Permission Sought/Granted                  Emotional Assessment              Admission diagnosis:  SOB (shortness of breath) [R06.02] Patient Active Problem List   Diagnosis Date Noted  . Acute respiratory failure with hypoxia ([place]) 04/20/2020  . Fluid overload 04/20/2020  . Hypertensive urgency 04/20/2020  . Leukocytosis 04/20/2020  . History of CVA (cerebrovascular accident) 04/20/2020  . Debility 03/20/2020  . Osteoarthritis 03/13/2020  . Diarrhea 02/18/2019  . GERD (gastroesophageal reflux disease) 01/24/2019  . Memory change 11/19/2018  . Senile purpura ([place]) 11/27/2017  . CKD (chronic kidney disease) stage 4, GFR 15-29 ml/min (HCC) 09/26/2017  . 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 Nonobstructive CAD s/p PCI 2012   . Cardiac pacemaker in situ   . PVD (peripheral vascular  disease) ([place])   . Anxiety 03/22/2017  . Acute cholecystitis s/p lap cholecystectomy 03/05/2017 03/04/2017  . 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[place]) 03/04/2017  . 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 Fatigue 01/27/2017  . Low vitamin B12 level 01/27/2017  . OSA on CPAP 10/26/2014  . Seasonal and perennial allergic rhinitis 10/25/2013  . Hypertension associated with diabetes ([place]) 10/25/2013  . Hyperlipidemia associated with type 2 diabetes mellitus ([place]) 10/25/2013    PCP:  [name], MD Pharmacy:   [id number] [place], [address] [name] [address] Phone: [phone] Fax: [phone]     Social Determinants of Health (SDOH) Interventions    Readmission Risk Interventions No flowsheet data found.

## 2020-04-21 NOTE — Care Management (Signed)
    Durable Medical Equipment  (From admission, onward)         Start     Ordered   04/21/20 1700  For home use only DME Hospital bed  Once       Question Answer Comment  Length of Need Lifetime   Patient has (list medical condition): OSA, CHF, CKD   The above medical condition requires: Patient requires the ability to reposition frequently   Head must be elevated greater than: 30 degrees   Bed type Semi-electric   Support Surface: Gel Overlay      04/21/20 1701

## 2020-04-21 NOTE — Telephone Encounter (Signed)
Please advise 

## 2020-04-21 NOTE — Progress Notes (Signed)
  Echocardiogram 2D Echocardiogram has been performed.  Michiel Cowboy 04/21/2020, 10:49 AM

## 2020-04-21 NOTE — ED Provider Notes (Signed)
[facility] Provider Note   CSN: [id number] Arrival date & time: 04/20/20  1256     History CC Shortness of breath  David [name] is a 79 y.o. male.  HPI   Patient presented to the ED for evaluation of shortness of breath as well as lower extremity swelling.  Patient does have history of chronic kidney disease.  He recently had a fistula placed in anticipation of needing dialysis but currently is not getting dialysis.  Patient also states that he is having pain in his left wrist.  He recently tripped over his dog but did not think he injured his wrist.  He was concerned that possibly his wrist pain may be associated with his recent fistula surgery which was on the same arm.  Patient denies any fevers.  He does get more short of breath with activity.  He denies any chest pain.  No abdominal pain.  No vomiting or diarrhea.  Past Medical History:  Diagnosis Date  . Acute cholecystitis s/p lap cholecystectomy 03/05/2017 03/04/2017  . Anemia, iron deficiency   . Anxiety   . Arthritis   . Atrial fibrillation with rapid ventricular response ([place])   . BPH (benign prostatic hypertrophy)   . CAD (coronary artery disease)    Nonobstructive CAD per cath  . Cardiac pacemaker in situ   . CHF (congestive heart failure) ([place])   . Chronic ulcer of right foot ([place])    resolved per patient  . CKD (chronic kidney disease) stage 4, GFR 15-29 ml/min (HCC) 09/26/2017  . CKD (chronic kidney disease), stage III ([place]) secondary to DM and HTN   nephrologist-  [name]  . Coronary artery disease involving native coronary artery of native heart without angina pectoris   . Depression   . Diastolic dysfunction   . Dyspnea    with exertion, no oxygen  . 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 Hearing loss    wears hearing aids  . History of cellulitis    right great toe 10-25-2014  . History of skin cancer   . HOCM (hypertrophic  obstructive cardiomyopathy) ([place])   . Hyperlipidemia associated with type 2 diabetes mellitus ([place]) 10/25/2013  . Hypertension   . Hypertension associated with diabetes ([place]) 10/25/2013  . 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 Insulin pump in place    Patient does not have insulin pump - removed approx 2 yrs ago 2019  . Memory loss    mild per patient  . OSA on CPAP   . Pacemaker 06/03/2015   Medtronic  . Peripheral neuropathy    severe - feet/lower legs  . Peripheral vascular disease ([place])    bilateral lower extremities  . PVD (peripheral vascular disease) ([place])   . Rib fracture 07/24/2015  . Seasonal and perennial allergic rhinitis 10/25/2013  . Seborrheic keratosis 09/19/2016  . Secondary hyperparathyroidism of renal origin ([place])   . 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 Sinus node dysfunction (HCC)   . Sleep apnea   . Syncope 07/24/2015  . Walker as ambulation aid     Patient Active Problem List   Diagnosis Date Noted  . Acute respiratory failure with hypoxia ([place]) 04/20/2020  . Fluid overload 04/20/2020  . Hypertensive urgency 04/20/2020  . Leukocytosis 04/20/2020  . History of  CVA (cerebrovascular accident) 04/20/2020  . Debility 03/20/2020  . Osteoarthritis 03/13/2020  . Diarrhea 02/18/2019  . GERD (gastroesophageal reflux disease) 01/24/2019  . Memory change 11/19/2018  . Senile purpura ([place]) 11/27/2017  . CKD (chronic kidney disease) stage 4, GFR 15-29 ml/min (HCC) 09/26/2017  . 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 Nonobstructive CAD s/p PCI 2012   . Cardiac pacemaker in situ   . PVD (peripheral vascular disease) ([place])   . Anxiety 03/22/2017  . Acute cholecystitis s/p lap cholecystectomy 03/05/2017 03/04/2017  . 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[place]) 03/04/2017  . IDDM (insulin  dependent diabetes mellitus) - on insulin pump 03/04/2017  . Fatigue 01/27/2017  . Low vitamin B12 level 01/27/2017  . OSA on CPAP 10/26/2014  . Seasonal and perennial allergic rhinitis 10/25/2013  . Hypertension associated with diabetes ([place]) 10/25/2013  . Hyperlipidemia associated with type 2 diabetes mellitus ([place]) 10/25/2013    Past Surgical History:  Procedure Laterality Date  . AMPUTATION OF REPLICATED TOES  Mar 5009   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)  . AMPUTATION TOE Right 03/12/2015   Procedure: RIGHT HALLUS AMPUTATION ;  Surgeon: [name], MD;  Location: [facility];  Service: Podiatry;  Laterality: Right;  . AV FISTULA PLACEMENT Left 04/16/2020   Procedure: CREATION OF LEFT Brachial/Cephalic Arteriovenous Fistula.;  Surgeon: Cherre Robins, MD;  Location: [facility];  Service: Vascular;  Laterality: Left;  . CARDIAC CATHETERIZATION  11-25-2010   [place], [place]   Nonobstructive CAD  . CARDIAC PACEMAKER PLACEMENT  Nov 2009   Medtronic  . CHOLECYSTECTOMY N/A 03/05/2017   Procedure: LAPAROSCOPIC CHOLECYSTECTOMY WITH INTRAOPERATIVE CHOLANGIOGRAM;  Surgeon: [name], MD;  Location: [facility];  Service: General;  Laterality: N/A;  . EP IMPLANTABLE DEVICE N/A 06/03/2015   Procedure: PPM Generator Changeout;  Surgeon: [name], MD;  Location: [facility];  Service: Cardiovascular;  Laterality: N/A;  . EXCISION BONE CYST Right 03/06/2015   Procedure: BONE BIOPSIES OF RIGHT FOOT;  Surgeon: [name], MD;  Location: [facility];  Service: Podiatry;  Laterality: Right;  . EYE SURGERY Bilateral   . KNEE ARTHROSCOPY    . ORIF ANKLE FRACTURE Left 11/06/2014   Procedure: OPEN REDUCTION INTERNAL FIXATION (ORIF) LEFT  ANKLE FRACTURE;  Surgeon: [name], MD;  Location: [facility];  Service: Orthopedics;  Laterality: Left;  . TEE WITHOUT CARDIOVERSION N/A 09/01/2017   Procedure: TRANSESOPHAGEAL ECHOCARDIOGRAM (TEE);  Surgeon: [name], MD;  [facility];  Service: Cardiovascular;  Laterality: N/A;  . TOTAL KNEE ARTHROPLASTY     patient denies this surgery  . VEIN LIGATION AND STRIPPING         Family History  Problem Relation Age of Onset  . Cancer Mother        breast  . Heart attack Father   . Stroke Father     Social History   Tobacco Use  . Smoking status: Former Smoker    Packs/day: 2.00    Years: 30.00    Pack years: 60.00    Quit date: 03/03/1984    Years since quitting: 36.1  . Smokeless tobacco: Never Used  Vaping Use  . Vaping Use: Never used  Substance Use Topics  . Alcohol use: Yes    Alcohol/week: 1.0 standard drink    Types: 1 Glasses of wine per week    Comment: social 1-2 times/month  . Drug use: No  Home Medications Prior to Admission medications   Medication Sig Start Date End Date Taking? Authorizing Provider  acetaminophen (TYLENOL 8 HOUR ARTHRITIS PAIN) 650 MG CR tablet Take 650 mg by mouth every 8 (eight) hours as needed for pain.   Yes [provider]  amLODipine (NORVASC) 5 MG tablet TAKE 1 TABLET BY MOUTH EVERY DAY Patient taking differently: Take 5 mg by mouth daily.  01/24/20  Yes [name], MD  atorvastatin (LIPITOR) 10 MG tablet TAKE 1 TABLET BY MOUTH EVERY DAY AT 6PM Patient taking differently: Take 10 mg by mouth at bedtime.  01/24/20  Yes [name], MD  calcitRIOL (ROCALTROL) 0.25 MCG capsule Take 1 capsule by mouth once daily Patient taking differently: Take 0.25 mcg by mouth daily.  09/13/18  Yes [name], MD  carvedilol (COREG) 25 MG tablet TAKE 1 TABLET BY MOUTH TWICE DAILY WITH A MEAL Patient taking differently: Take 25 mg by mouth 2 (two) times daily with a meal.  04/13/20  Yes [name], MD  cholecalciferol (VITAMIN D) 1000 units tablet Take 1 tablet (1,000 Units total) by mouth daily. 02/01/17  Yes [name], MD  cholestyramine light (PREVALITE) 4 g packet Take 4 g by mouth daily.   Yes [provider]  clopidogrel (PLAVIX) 75 MG tablet TAKE 1 TABLET BY MOUTH EVERY DAY Patient taking differently: Take 75 mg by mouth daily.  04/13/20  Yes [name], MD  folic acid (FOLVITE) 1 MG tablet Take 1 mg by mouth daily.   Yes [provider]  hydrALAZINE (APRESOLINE) 100 MG tablet TAKE 1 TABLET BY MOUTH TWICE A DAY Patient taking differently: Take 100 mg by mouth 2 (two) times daily.  04/13/20  Yes [name], MD  insulin aspart (NOVOLOG FLEXPEN) 100 UNIT/ML FlexPen INJECT 10 UNITS SUBCUTANEOUSLY BEFORE THE FIRST MEAL OF THE DAY AND 12 UNITS AT [facility] Patient taking differently: Inject 10-12 Units into the skin See admin instructions. Inject 10 units subcutaneously before breakfast and 12 units before supper 02/11/20  Yes Elayne Snare, MD  isosorbide mononitrate (IMDUR) 30 MG 24 hr tablet TAKE 2 TABLETS DAILY. PLEASE MAKE OVERDUE APPT WITH DR. Caryl Comes BEFORE ANYMORE REFILLS. 1ST ATTEMPT Patient taking differently: Take 60 mg by mouth daily.  02/03/20  Yes [name], MD  Omega-3 Fatty Acids (FISH OIL) 1000 MG CAPS Take 1 capsule (1,000 mg total) by mouth every morning. 03/06/19  Yes Garvin Fila, MD  pantoprazole (PROTONIX) 40 MG tablet TAKE 1 TABLET BY MOUTH EVERY DAY Patient taking differently: Take 40 mg by mouth daily.  04/13/20  Yes [name], MD  PRESCRIPTION MEDICATION Place into the left eye See admin instructions. Injections at [facility] once every 4 weeks - left eye for diabetic retinopathy   Yes [provider]  sertraline (ZOLOFT) 100 MG tablet TAKE 1 TABLET BY MOUTH EVERY DAY Patient taking differently: Take 100 mg by mouth daily with supper.  04/13/20  Yes [name], MD  torsemide (DEMADEX) 20 MG tablet TAKE 2 TABS EVERY OTHER DAY. PLEASE MAKE OVERDUE APPT WITH DR. Caryl Comes BEFORE ANYMORE REFILLS. Patient taking differently: Take 20 mg by mouth See admin instructions. Take 2 tablets (40 mg) by mouth alternating with 1 tablet (20 mg)  - daily 02/18/20  Yes Turner, Eber Hong, MD  traMADol (ULTRAM) 50 MG tablet Take 1 tablet (50 mg total) by mouth every 12 (twelve) hours as needed for moderate pain. 04/16/20  Yes Dagoberto Ligas, PA-C  TRESIBA FLEXTOUCH 200 UNIT/ML FlexTouch Pen INJECT 44 UNITS SUBCUTANEOUSLY ONCE DAILY Patient taking differently: Inject 44 Units into the skin daily before supper.  03/02/20  Yes Elayne Snare, MD  VICTOZA 18 MG/3ML SOPN INJECT 0.3ML (1.8MG TOTAL) INTO THE SKIN DAILY BEFORE SUPPER Patient taking differently: Inject 1.8 mg into the skin daily before supper.  08/26/19  Yes Elayne Snare, MD  vitamin B-12 (CYANOCOBALAMIN) 100 MCG tablet Take 100 mcg by mouth daily.   Yes [provider]  blood glucose meter kit and supplies KIT Dispense based on patient and insurance preference. Use up to four times daily as directed. (FOR ICD-9 250.00, 250.01). 01/12/19   Elodia Florence., MD  glucose blood test strip Use Contour test strips as instructed to check blood sugar four times daily. 10/25/19   Elayne Snare, MD  Insulin Syringe-Needle U-100 (INSULIN SYRINGE 1CC/31GX5/16") 31G X 5/16" 1 ML MISC 1 each by Does not apply route 3 (three) times daily. Use insulin syringe to inject insulin three times daily. 01/08/19   Elayne Snare, MD    Allergies    Ativan [lorazepam] and Adhesive [tape]  Review of Systems   Review of Systems  All other systems reviewed and are negative.   Physical Exam Updated Vital Signs BP (!) 146/42 (BP Location: Right Arm)   Pulse (!) 59   Temp 98.1 F (36.7 C) (Oral)   Resp 18   SpO2 97%   Physical Exam Vitals and nursing note reviewed.  Constitutional:      Appearance: He is well-developed. He is not toxic-appearing or diaphoretic.  HENT:     Head: Normocephalic and atraumatic.     Right Ear: External ear normal.     Left Ear: External ear normal.  Eyes:     General: No scleral icterus.       Right eye: No discharge.        Left eye: No discharge.      Conjunctiva/sclera: Conjunctivae normal.  Neck:     Trachea: No tracheal deviation.  Cardiovascular:     Rate and Rhythm: Normal rate and regular rhythm.  Pulmonary:     Effort: Pulmonary effort is normal. No respiratory distress.     Breath sounds: Normal breath sounds. No stridor. No wheezing or rales.  Abdominal:     General: Bowel sounds are normal. There is no distension.     Palpations: Abdomen is soft.     Tenderness: There is no abdominal tenderness. There is no guarding or rebound.  Musculoskeletal:        General: Tenderness present.     Cervical back: Neck supple.     Right lower leg: Edema present.     Left lower leg: Edema present.     Comments: Tenderness palpation left wrist, pain with range of motion, no erythema or edema, forearm without swelling or edema, mild bruising noted around the left surgical site in the antecubital fossa, no erythema, drainage or increased warmth  Skin:    General: Skin is warm and dry.     Findings: No rash.  Neurological:     Mental Status: He is alert.     Cranial Nerves: No cranial nerve deficit (no facial droop, extraocular movements intact, no slurred speech).     Sensory: No sensory deficit.     Motor: No abnormal muscle tone or seizure activity.     Coordination: Coordination normal.     ED Results / Procedures / Treatments  Labs (all labs ordered are listed, but only abnormal results are displayed) Labs Reviewed  BASIC METABOLIC PANEL - Abnormal; Notable for the following components:      Result Value   CO2 17 (*)    Glucose, Bld 117 (*)    BUN 55 (*)    Creatinine, Ser 2.90 (*)    GFR, Estimated 21 (*)    All other components within normal limits  CBC - Abnormal; Notable for the following components:   WBC 11.9 (*)    RBC 2.88 (*)    Hemoglobin 9.0 (*)    HCT 28.7 (*)    All other components within normal limits  BRAIN NATRIURETIC PEPTIDE - Abnormal; Notable for the following components:   B Natriuretic Peptide  1,198.8 (*)    All other components within normal limits  CBC - Abnormal; Notable for the following components:   WBC 11.0 (*)    RBC 2.67 (*)    Hemoglobin 8.2 (*)    HCT 25.9 (*)    All other components within normal limits  RENAL FUNCTION PANEL - Abnormal; Notable for the following components:   CO2 20 (*)    Glucose, Bld 188 (*)    BUN 61 (*)    Creatinine, Ser 3.07 (*)    Albumin 2.6 (*)    GFR, Estimated 20 (*)    All other components within normal limits  FERRITIN - Abnormal; Notable for the following components:   Ferritin 542 (*)    All other components within normal limits  IRON AND TIBC - Abnormal; Notable for the following components:   Iron 24 (*)    TIBC 157 (*)    Saturation Ratios 15 (*)    All other components within normal limits  I-STAT VENOUS BLOOD GAS, ED - Abnormal; Notable for the following components:   pCO2, Ven 31.3 (*)    pO2, Ven 100.0 (*)    Acid-base deficit 3.0 (*)    HCT 26.0 (*)    Hemoglobin 8.8 (*)    All other components within normal limits  RESP PANEL BY RT-PCR (FLU A&B, COVID) ARPGX2  CULTURE, BLOOD (ROUTINE X 2)  CULTURE, BLOOD (ROUTINE X 2)  PROCALCITONIN    EKG None  Radiology DG Wrist Complete Left  Result Date: 04/20/2020 CLINICAL DATA:  Wrist pain procedure for fistula on LEFT arm with pain in LEFT wrist EXAM: LEFT WRIST - COMPLETE 3+ VIEW COMPARISON:  None FINDINGS: Extensive vascular calcifications project over the soft tissues of the LEFT wrist. Extensive radiocarpal joint space narrowing. Mild carpometacarpal degenerative changes of the first carpometacarpal joint. No sign of acute fracture or bone abnormality. IMPRESSION: 1. No acute fracture or dislocation. 2. Degenerative changes about the wrist. 3. Extensive vascular calcifications in the soft tissues of the LEFT wrist. Electronically Signed   By: Zetta Bills [name]   On: 04/20/2020 14:43   DG Chest Port 1 View  Result Date: 04/20/2020 CLINICAL DATA:  Dyspnea,  shortness of breath history of lower extremity edema, CHF and hypertension EXAM: PORTABLE CHEST 1 VIEW COMPARISON:  January 10, 2019 FINDINGS: LEFT-sided power pack for dual lead pacer device projects over LEFT chest. Trachea midline. Cardiomediastinal contours and hilar structures are similar to the prior exam with central pulmonary vascular engorgement. Partially obscured LEFT hemidiaphragm. Graded opacity at RIGHT lung base. On limited assessment no acute skeletal process. IMPRESSION: LEFT lower lobe airspace disease, atelectasis or developing infection. 1. Potential small bilateral pleural effusions. Electronically Signed   By:  Zetta Bills [name]   On: 04/20/2020 13:53    Procedures Procedures (including critical care time)  Medications Ordered in ED Medications  heparin injection 5,000 Units (5,000 Units Subcutaneous Given 04/20/20 2108)  sodium chloride flush (NS) 0.9 % injection 3 mL (3 mLs Intravenous Given 04/20/20 2109)  guaiFENesin (MUCINEX) 12 hr tablet 600 mg (600 mg Oral Given 04/20/20 2104)  ondansetron (ZOFRAN) tablet 4 mg (has no administration in time range)    Or  ondansetron (ZOFRAN) injection 4 mg (has no administration in time range)  acetaminophen (TYLENOL) tablet 650 mg (has no administration in time range)    Or  acetaminophen (TYLENOL) suppository 650 mg (has no administration in time range)  albuterol (PROVENTIL) (2.5 MG/3ML) 0.083% nebulizer solution 2.5 mg (has no administration in time range)  traMADol (ULTRAM) tablet 50 mg (50 mg Oral Given 04/20/20 2256)  atorvastatin (LIPITOR) tablet 10 mg (10 mg Oral Given 04/20/20 2105)  carvedilol (COREG) tablet 25 mg (has no administration in time range)  amLODipine (NORVASC) tablet 5 mg (5 mg Oral Given 04/20/20 2105)  cholestyramine light (PREVALITE) packet 4 g (4 g Oral Given 04/20/20 2103)  hydrALAZINE (APRESOLINE) tablet 100 mg (100 mg Oral Given 04/20/20 2106)  isosorbide mononitrate (IMDUR) 24 hr tablet 60 mg (60 mg Oral  Given 04/20/20 2107)  sertraline (ZOLOFT) tablet 100 mg (100 mg Oral Given 04/20/20 2106)  calcitRIOL (ROCALTROL) capsule 0.25 mcg (0.25 mcg Oral Given 04/20/20 2107)  clopidogrel (PLAVIX) tablet 75 mg (75 mg Oral Given 04/20/20 2105)  insulin aspart (novoLOG) injection 0-15 Units (has no administration in time range)  furosemide (LASIX) injection 80 mg (has no administration in time range)  sodium bicarbonate tablet 1,300 mg (1,300 mg Oral Given 04/20/20 2249)  cefTRIAXone (ROCEPHIN) 1 g in sodium chloride 0.9 % 100 mL IVPB (0 g Intravenous Stopped 04/20/20 1735)  azithromycin (ZITHROMAX) tablet 500 mg (500 mg Oral Given 04/20/20 1626)  furosemide (LASIX) injection 40 mg (40 mg Intravenous Given 04/20/20 1622)  traMADol (ULTRAM) tablet 50 mg (50 mg Oral Given 04/20/20 1738)  furosemide (LASIX) injection 40 mg (40 mg Intravenous Given 04/20/20 1736)    ED Course  I have reviewed the triage vital signs and the nursing notes.  Pertinent labs & imaging results that were available during my care of the patient were reviewed by me and considered in my medical decision making (see chart for details).  Clinical Course as of Apr 21 746  Mon Apr 20, 2020  1411 Anemia noted.  Similar to previous values   [JK]  1411 Chest x-ray suggestive of possible atelectasis or developing infection   [JK]  1545 Labs notable for elevated BNP   [JK]  1546 Renal function not significantly changed from previous   [JK]  1546 X-ray does not show evidence of fracture. Degenerative changes noted   [JK]    Clinical Course User Index [JK] Dorie Rank, MD   MDM Rules/Calculators/A&P                          Patient's complaints of shortness of breath concerning for the possibility of pneumonia, CHF.  Doubt PE.  Doubt ACS.  X-ray suggested possibility of developing infection although not clearly having any other infectious symptoms.  Patient also does appear to have component of fluid overload with peripheral edema.   Dyspnea may be a combination of the 2.  Patient started on antibiotics.  Also given dose of Lasix.  With  his new oxygen requirement medical service consulted for admission and further treatment.  No indication for emergent dialysis at this time.  Also complaining of left wrist pain.  No evidence of fracture.  He does have degenerative changes noted and I suspect this pain is likely related to that and may have been exacerbated by his recent fall. Final Clinical Impression(s) / ED Diagnoses Final diagnoses:  Community acquired pneumonia, unspecified laterality  Acute congestive heart failure, unspecified heart failure type [facility])    Rx / DC Orders ED Discharge Orders    None       Dorie Rank, MD 04/21/20 [phone]

## 2020-04-21 NOTE — Plan of Care (Signed)
  Problem: Clinical Measurements: °Goal: Respiratory complications will improve °Outcome: Progressing °  °Problem: Clinical Measurements: °Goal: Cardiovascular complication will be avoided °Outcome: Progressing °  °Problem: Elimination: °Goal: Will not experience complications related to urinary retention °Outcome: Progressing °  °

## 2020-04-21 NOTE — Consult Note (Signed)
   [facility] Inpatient Consult   04/21/2020  David [name] 10-19-1940 [id number]    [facility] [ACO] Patient: Medicare NextGen  Patient is currently active with [facility] for chronic disease management services with a Central for Diabetes Management noted.  Plan:  Follow up with  Inpatient Transition Of Care [TOC] team member to make aware that [facility] following.   Of note, [facility] Care Management services does not replace or interfere with any services that are needed or arranged by inpatient [facility] care management team.  For additional questions or referrals please contact:  Natividad Brood, RN BSN [facility] Liaison  [phone] business mobile phone Toll free office [phone]  Fax number: [phone] [email or web address]

## 2020-04-21 NOTE — Progress Notes (Signed)
PROGRESS NOTE    David [name]  IPJ:[id number] DOB: 1940/10/12 DOA: 04/20/2020 PCP: [name], MD    Brief Narrative:  79 y.o. 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  Patient reports that he has had progressively worsening shortness of breath over the last 1 month, but had recently became worse. Pt is followed by Nephrology for CKD, recently having AVF placed. Pt reported increasing cough and sob. CXR with findings of L sided airspace disease with atelectasis  Assessment & Plan:   Principal Problem:   Acute respiratory failure with hypoxia (HCC) Active Problems:   Hyperlipidemia associated with type 2 diabetes mellitus (HCC)   OSA on CPAP   CKD (chronic kidney disease) stage 4, GFR 15-29 ml/min (HCC)   Fluid overload   Hypertensive urgency   Leukocytosis   History of CVA (cerebrovascular accident)  Acute respiratory failure with hypoxia secondary to fluid overload: Acute.   -Patient presents with complaints of acutely worsening shortness of breath and dry cough.  O2 saturations noted to be as low as 70% at home on room air. -Presenting chest x-ray showed concern for possibility of left lower lobe infiltrate with concern for potential small bilateral pleural effusion. -Pt is afebrile, no leukocytosis. Procalcitonin is 0.2 -Will hold empiric Rocephin and azithromycin at this time -continue volume management per Nephrology below  Fluid overload Diastolic congestive heart failure exacerbation:  -florid volume overload on exam and with marked LE edema -Strict intake and output -Daily weight -Continued on Lasix 80 mg IV bid -2d echo performed and reviewed, finding of EF of 55-60%  Hypertensive urgency:  -On admission blood pressure is elevated up to 200/60.   -Continued on home amlodipine 5 mg daily, Coreg 25 mg  twice daily, hydralazine 100 mg twice daily, isosorbide mononitrate 60 mg daily -Continue home regimen -Hydralazine IV as needed -BP seems improved this afternoon  Chronic kidney disease stage IV:  -On admission patient presents with creatinine 2.9 and BUN 55.   -Continue Calcitrol -Nephrology following. Continue with IV BID lasix as tolerated. If there is no adequate response to diuresis, then would consider initiating HD in-house  Leukocytosis:  WBC mildly elevated 11.9 on admission. -Afebrile -WBC of 11.0k this AM -Procal of 0.22 with empiric abx held  Insulin-dependent diabetes mellitus: Last available hemoglobin A1c noted to be 6.5 03/30/2020 -Hypoglycemic protocols -continue SSI coverage -Glucose trends remain stable while off basal insulin. Cont to monitor  SA node dysfunction status post pacemaker  Anemia of chronic disease:  -On admission patient's hemoglobin was noted to be 9.0 g/dL which appears near his baseline.  No reports of bleeding. -Iron level mildly low at 24  Hyperlipidemia: Current medications include atorvastatin 10 mg daily. -Continue current home meds  Prior history of CVA -Continue Plavix and statin  OSA on CPAP -Continue CPAP  DVT prophylaxis: Heparin subq Code Status: Full Family Communication: Pt in room, family is at bedside  Status is: Inpatient  Remains inpatient appropriate because:Unsafe d/c plan, IV treatments appropriate due to intensity of illness or inability to take PO and Inpatient level of care appropriate due to severity of illness   Dispo: The patient is from: Home              Anticipated d/c is to: Home  Anticipated d/c date is: > 3 days              Patient currently is not medically stable to d/c.       Consultants:   Nephrology    Procedures:     Antimicrobials: Anti-infectives (From admission, onward)   Start     Dose/Rate Route Frequency Ordered Stop   04/20/20 1600  cefTRIAXone  (ROCEPHIN) 1 g in sodium chloride 0.9 % 100 mL IVPB        1 g 200 mL/hr over 30 Minutes Intravenous  Once 04/20/20 1547 04/20/20 1735   04/20/20 1600  azithromycin (ZITHROMAX) tablet 500 mg        500 mg Oral  Once 04/20/20 1547 04/20/20 1626       Subjective: Reports breathing well today, still swollen  Objective: Vitals:   04/20/20 2358 04/21/20 0515 04/21/20 0737 04/21/20 0827  BP:  (!) 146/42  (!) 161/50  Pulse: (!) 58 (!) 59    Resp: 19 (!) 21 18 19   Temp:  98.1 F (36.7 C)    TempSrc:  Oral    SpO2: 98% 97%      Intake/Output Summary (Last 24 hours) at 04/21/2020 1346 Last data filed at 04/21/2020 0900 Gross per 24 hour  Intake 470 ml  Output 500 ml  Net -30 ml   There were no vitals filed for this visit.  Examination:  General exam: Appears calm and comfortable  Respiratory system: Clear to auscultation. Respiratory effort normal. Cardiovascular system: S1 & S2 heard, regular Gastrointestinal system: Abdomen is nondistended, soft and nontender. No organomegaly or masses felt. Normal bowel sounds heard. Central nervous system: Alert and oriented. No focal neurological deficits. Extremities: Symmetric 5 x 5 power, LE 3+ pitting edema Skin: No rashes, lesions  Psychiatry: Judgement and insight appear normal. Mood & affect appropriate.   Data Reviewed: I have personally reviewed following labs and imaging studies  CBC: Recent Labs  Lab 04/15/20 1032 04/16/20 0617 04/20/20 1315 04/20/20 1327 04/21/20 0115  WBC  --   --  11.9*  --  11.0*  HGB 9.1* 9.9* 9.0* 8.8* 8.2*  HCT  --  29.0* 28.7* 26.0* 25.9*  MCV  --   --  99.7  --  97.0  PLT  --   --  183  --  974   Basic Metabolic Panel: Recent Labs  Lab 04/15/20 1100 04/16/20 0617 04/20/20 1315 04/20/20 1327 04/21/20 0115  NA 142 143 139 141 139  K 3.8 4.1 4.1 4.0 3.9  CL 112* 110 108  --  106  CO2 18*  --  17*  --  20*  GLUCOSE 89 104* 117*  --  188*  BUN 61* 59* 55*  --  61*  CREATININE 3.04*  2.90* 2.90*  --  3.07*  CALCIUM 9.0  --  9.1  --  8.9  PHOS 3.9  --   --   --  4.5   GFR: Estimated Creatinine Clearance: 28.1 mL/min (A) (by C-G formula based on SCr of 3.07 mg/dL (H)). Liver Function Tests: Recent Labs  Lab 04/15/20 1100 04/21/20 0115  ALBUMIN 3.0* 2.6*   No results for input(s): LIPASE, AMYLASE in the last 168 hours. No results for input(s): AMMONIA in the last 168 hours. Coagulation Profile: No results for input(s): INR, PROTIME in the last 168 hours. Cardiac Enzymes: No results for input(s): CKTOTAL, CKMB, CKMBINDEX, TROPONINI in the last 168 hours. BNP (last 3 results) No results for input(s):  PROBNP in the last 8760 hours. HbA1C: No results for input(s): HGBA1C in the last 72 hours. CBG: Recent Labs  Lab 04/16/20 0918 04/21/20 0756 04/21/20 1239  GLUCAP 105* 115* 159*   Lipid Profile: No results for input(s): CHOL, HDL, LDLCALC, TRIG, CHOLHDL, LDLDIRECT in the last 72 hours. Thyroid Function Tests: No results for input(s): TSH, T4TOTAL, FREET4, T3FREE, THYROIDAB in the last 72 hours. Anemia Panel: Recent Labs    04/21/20 0115  FERRITIN 542*  TIBC 157*  IRON 24*   Sepsis Labs: Recent Labs  Lab 04/20/20 1815  PROCALCITON 0.20    Recent Results (from the past 240 hour(s))  SARS CORONAVIRUS 2 (TAT 6-24 HRS) Nasopharyngeal Nasopharyngeal Swab     Status: None   Collection Time: 04/14/20  1:52 PM   Specimen: Nasopharyngeal Swab  Result Value Ref Range Status   SARS Coronavirus 2 NEGATIVE NEGATIVE Final    Comment: (NOTE) SARS-CoV-2 target nucleic acids are NOT DETECTED.  The SARS-CoV-2 RNA is generally detectable in upper and lower respiratory specimens during the acute phase of infection. Negative results do not preclude SARS-CoV-2 infection, do not rule out co-infections with other pathogens, and should not be used as the sole basis for treatment or other patient management decisions. Negative results must be combined with clinical  observations, patient history, and epidemiological information. The expected result is Negative.  Fact Sheet for Patients: SugarRoll.be  Fact Sheet for Healthcare Providers: https://www.woods-mathews.com/  This test is not yet approved or cleared by the [place] FDA and  has been authorized for detection and/or diagnosis of SARS-CoV-2 by FDA under an Emergency Use Authorization (EUA). This EUA will remain  in effect (meaning this test can be used) for the duration of the COVID-19 declaration under Se ction 564(b)(1) of the Act, 21 U.S.C. section 360bbb-3(b)(1), unless the authorization is terminated or revoked sooner.  Performed at [facility], [place] [address], [place], [place] [address]   Resp Panel by RT-PCR (Flu A&B, Covid) Nasopharyngeal Swab     Status: None   Collection Time: 04/20/20  1:10 PM   Specimen: Nasopharyngeal Swab; Nasopharyngeal(NP) swabs in vial transport medium  Result Value Ref Range Status   SARS Coronavirus 2 by RT PCR NEGATIVE NEGATIVE Final    Comment: (NOTE) SARS-CoV-2 target nucleic acids are NOT DETECTED.  The SARS-CoV-2 RNA is generally detectable in upper respiratory specimens during the acute phase of infection. The lowest concentration of SARS-CoV-2 viral copies this assay can detect is 138 copies/mL. A negative result does not preclude SARS-Cov-2 infection and should not be used as the sole basis for treatment or other patient management decisions. A negative result may occur with  improper specimen collection/handling, submission of specimen other than nasopharyngeal swab, presence of viral mutation(s) within the areas targeted by this assay, and inadequate number of viral copies(<138 copies/mL). A negative result must be combined with clinical observations, patient history, and epidemiological information. The expected result is Negative.  Fact Sheet for Patients:   EntrepreneurPulse.com.au  Fact Sheet for Healthcare Providers:  IncredibleEmployment.be  This test is no t yet approved or cleared by the [place] FDA and  has been authorized for detection and/or diagnosis of SARS-CoV-2 by FDA under an Emergency Use Authorization (EUA). This EUA will remain  in effect (meaning this test can be used) for the duration of the COVID-19 declaration under Section 564(b)(1) of the Act, 21 U.S.C.section 360bbb-3(b)(1), unless the authorization is terminated  or revoked sooner.  Influenza A by PCR NEGATIVE NEGATIVE Final   Influenza B by PCR NEGATIVE NEGATIVE Final    Comment: (NOTE) The Xpert Xpress SARS-CoV-2/FLU/RSV plus assay is intended as an aid in the diagnosis of influenza from Nasopharyngeal swab specimens and should not be used as a sole basis for treatment. Nasal washings and aspirates are unacceptable for Xpert Xpress SARS-CoV-2/FLU/RSV testing.  Fact Sheet for Patients: EntrepreneurPulse.com.au  Fact Sheet for Healthcare Providers: IncredibleEmployment.be  This test is not yet approved or cleared by the [place] FDA and has been authorized for detection and/or diagnosis of SARS-CoV-2 by FDA under an Emergency Use Authorization (EUA). This EUA will remain in effect (meaning this test can be used) for the duration of the COVID-19 declaration under Section 564(b)(1) of the Act, 21 U.S.C. section 360bbb-3(b)(1), unless the authorization is terminated or revoked.  Performed at [facility], [place] [address], [place], [place] [address]   Blood culture (routine x 2)     Status: None (Preliminary result)   Collection Time: 04/20/20  6:13 PM   Specimen: Site Not Specified; Blood  Result Value Ref Range Status   Specimen Description SITE NOT SPECIFIED  Final   Special Requests   Final    BOTTLES DRAWN AEROBIC AND ANAEROBIC Blood Culture adequate volume    Culture   Final    NO GROWTH < 24 HOURS Performed at [facility], [place] [address], [place], [place] [address]    Report Status PENDING  Incomplete  Blood culture (routine x 2)     Status: None (Preliminary result)   Collection Time: 04/20/20  6:17 PM   Specimen: Site Not Specified; Blood  Result Value Ref Range Status   Specimen Description SITE NOT SPECIFIED  Final   Special Requests   Final    BOTTLES DRAWN AEROBIC AND ANAEROBIC Blood Culture adequate volume   Culture   Final    NO GROWTH < 24 HOURS Performed at [facility], [place] [address] [place], [place] [address]    Report Status PENDING  Incomplete     Radiology Studies: DG Wrist Complete Left  Result Date: 04/20/2020 CLINICAL DATA:  Wrist pain procedure for fistula on LEFT arm with pain in LEFT wrist EXAM: LEFT WRIST - COMPLETE 3+ VIEW COMPARISON:  None FINDINGS: Extensive vascular calcifications project over the soft tissues of the LEFT wrist. Extensive radiocarpal joint space narrowing. Mild carpometacarpal degenerative changes of the first carpometacarpal joint. No sign of acute fracture or bone abnormality. IMPRESSION: 1. No acute fracture or dislocation. 2. Degenerative changes about the wrist. 3. Extensive vascular calcifications in the soft tissues of the LEFT wrist. Electronically Signed   By: Zetta Bills [name]   On: 04/20/2020 14:43   DG Chest Port 1 View  Result Date: 04/20/2020 CLINICAL DATA:  Dyspnea, shortness of breath history of lower extremity edema, CHF and hypertension EXAM: PORTABLE CHEST 1 VIEW COMPARISON:  January 10, 2019 FINDINGS: LEFT-sided power pack for dual lead pacer device projects over LEFT chest. Trachea midline. Cardiomediastinal contours and hilar structures are similar to the prior exam with central pulmonary vascular engorgement. Partially obscured LEFT hemidiaphragm. Graded opacity at RIGHT lung base. On limited assessment no acute skeletal process. IMPRESSION: LEFT lower lobe  airspace disease, atelectasis or developing infection. 1. Potential small bilateral pleural effusions. Electronically Signed   By: Zetta Bills [name]   On: 04/20/2020 13:53   ECHOCARDIOGRAM COMPLETE  Result Date: 04/21/2020    ECHOCARDIOGRAM REPORT  Patient Name:   David [name] Date of Exam: 04/21/2020 Medical Rec #:  [id number]        Height:       76.0 in Accession #:    [id number]       Weight:       274.0 lb Date of Birth:  1941-01-11         BSA:          2.532 m Patient Age:    16 years         BP:           161/50 mmHg Patient Gender: M                HR:           79 bpm. Exam Location:  Inpatient Procedure: 2D Echo, Cardiac Doppler, Color Doppler and Intracardiac            Opacification Agent Indications:    CHF-Acute Diastolic 300.92 / Z30.07  History:        Patient has prior history of Echocardiogram examinations, most                 recent 01/07/2019. CHF, CAD, Pacemaker, Arrythmias:Atrial                 Fibrillation, Signs/Symptoms:Dyspnea; Risk Factors:Hypertension,                 Dyslipidemia and Former Smoker. PVD.  Sonographer:    Vickie Epley RDCS Referring Phys: [id number] RONDELL A SMITH IMPRESSIONS  1. Left ventricular ejection fraction, by estimation, is 55 to 60%. The left ventricle has normal function. The left ventricle has no regional wall motion abnormalities. There is moderate concentric left ventricular hypertrophy. Left ventricular diastolic parameters are consistent with Grade II diastolic dysfunction (pseudonormalization). Elevated left atrial pressure.  2. Right ventricular systolic function is normal. The right ventricular size is normal. Mildly increased right ventricular wall thickness. Tricuspid regurgitation signal is inadequate for assessing PA pressure.  3. Left atrial size was mildly dilated.  4. A small pericardial effusion is present. The pericardial effusion is circumferential. There is no evidence of cardiac tamponade.  5. The mitral valve is normal in structure.  No evidence of mitral valve regurgitation.  6. 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 There is mild calcification of the aortic valve. There is moderate thickening of the aortic valve. Aortic valve regurgitation is trivial. Mild to moderate aortic valve sclerosis/calcification is present, without any evidence of aortic stenosis.  7. Aortic dilatation noted. There is mild dilatation of the aortic root, measuring 41 mm.  8. The inferior vena cava is dilated in size with >50% respiratory variability, suggesting right atrial pressure of 8 mmHg. FINDINGS  Left Ventricle: Left ventricular ejection fraction, by estimation, is 55 to 60%. The left ventricle has normal function. The left ventricle has no regional wall motion abnormalities. Definity contrast agent was given IV to delineate the left ventricular  endocardial borders. The left ventricular internal cavity size was normal in size. There is moderate concentric left ventricular hypertrophy. Left ventricular diastolic parameters are consistent with Grade II diastolic dysfunction (pseudonormalization).  Elevated left atrial pressure. Right Ventricle: The right ventricular size is normal. Mildly increased right ventricular wall thickness. Right ventricular systolic function is normal. Tricuspid regurgitation signal is inadequate for assessing PA pressure. Left Atrium: Left atrial size was mildly dilated. Right Atrium: Right atrial size was normal in size. Pericardium: A small pericardial effusion is present. The  pericardial effusion is circumferential. There is no evidence of cardiac tamponade. Mitral Valve: The mitral valve is normal in structure. No evidence of mitral valve regurgitation. Tricuspid Valve: The tricuspid valve is normal in structure. Tricuspid valve regurgitation is not demonstrated. 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 There is mild calcification of the aortic valve. There is moderate thickening of the aortic valve. Aortic valve regurgitation is trivial.  Mild to moderate aortic valve sclerosis/calcification is present, without any evidence of aortic stenosis. Aortic valve mean gradient measures 9.0 mmHg. Aortic valve peak gradient measures 14.7 mmHg. Aortic valve area, by VTI measures 3.09 cm. Pulmonic Valve: The pulmonic valve was grossly normal. Pulmonic valve regurgitation is not visualized. Aorta: Aortic dilatation noted. There is mild dilatation of the aortic root, measuring 41 mm. Venous: The inferior vena cava is dilated in size with greater than 50% respiratory variability, suggesting right atrial pressure of 8 mmHg. IAS/Shunts: No atrial level shunt detected by color flow Doppler. Additional Comments: A pacer wire is visualized.  LEFT VENTRICLE PLAX 2D LVIDd:         5.50 cm      Diastology LVIDs:         3.70 cm      LV e' medial:    4.13 cm/s LV PW:         1.50 cm      LV E/e' medial:  19.1 LV IVS:        1.60 cm      LV e' lateral:   5.70 cm/s LVOT diam:     2.40 cm      LV E/e' lateral: 13.8 LV SV:         138 LV SV Index:   54 LVOT Area:     4.52 cm  LV Volumes (MOD) LV vol d, MOD A2C: 229.0 ml LV vol d, MOD A4C: 178.0 ml LV vol s, MOD A2C: 87.8 ml LV vol s, MOD A4C: 56.3 ml LV SV MOD A2C:     141.2 ml LV SV MOD A4C:     178.0 ml LV SV MOD BP:      134.5 ml RIGHT VENTRICLE RV S prime:     12.90 cm/s TAPSE (M-mode): 2.5 cm LEFT ATRIUM             Index       RIGHT ATRIUM           Index LA diam:        4.30 cm 1.70 cm/m  RA Area:     22.80 cm LA Vol (A2C):   69.2 ml 27.33 ml/m RA Volume:   64.40 ml  25.43 ml/m LA Vol (A4C):   64.3 ml 25.39 ml/m LA Biplane Vol: 66.7 ml 26.34 ml/m  AORTIC VALVE AV Area (Vmax):    2.71 cm AV Area (Vmean):   2.55 cm AV Area (VTI):     3.09 cm AV Vmax:           192.00 cm/s AV Vmean:          142.000 cm/s AV VTI:            0.447 m AV Peak Grad:      14.7 mmHg AV Mean Grad:      9.0 mmHg LVOT Vmax:         115.00 cm/s LVOT Vmean:        79.900 cm/s LVOT VTI:          0.305 m  LVOT/AV VTI ratio: 0.68  AORTA Ao Root  diam: 4.20 cm Ao Asc diam:  3.50 cm MITRAL VALVE MV Area (PHT): 2.87 cm    SHUNTS MV Decel Time: 264 msec    Systemic VTI:  0.30 m MV E velocity: 78.80 cm/s  Systemic Diam: 2.40 cm MV A velocity: 75.40 cm/s MV E/A ratio:  1.05 Mihai Croitoru MD Electronically signed by Sanda Klein MD Signature Date/Time: 04/21/2020/11:12:47 AM    Final     Scheduled Meds: . amLODipine  5 mg Oral Daily  . atorvastatin  10 mg Oral QHS  . calcitRIOL  0.25 mcg Oral Daily  . carvedilol  25 mg Oral BID WC  . cholestyramine light  4 g Oral Daily  . clopidogrel  75 mg Oral Daily  . furosemide  80 mg Intravenous BID  . guaiFENesin  600 mg Oral BID  . heparin  5,000 Units Subcutaneous Q8H  . hydrALAZINE  100 mg Oral BID  . insulin aspart  0-15 Units Subcutaneous TID WC  . isosorbide mononitrate  60 mg Oral Daily  . sertraline  100 mg Oral Q supper  . sodium bicarbonate  1,300 mg Oral TID  . sodium chloride flush  3 mL Intravenous Q12H   Continuous Infusions:   LOS: 1 day   Marylu Lund, MD [facility] Pager On Amion  If 7PM-7AM, please contact night-coverage 04/21/2020, 1:46 PM

## 2020-04-22 LAB — COMPREHENSIVE METABOLIC PANEL
ALT: 8 U/L (ref 0–44)
AST: 16 U/L (ref 15–41)
Albumin: 2.6 g/dL — ABNORMAL LOW (ref 3.5–5.0)
Alkaline Phosphatase: 80 U/L (ref 38–126)
Anion gap: 12 (ref 5–15)
BUN: 68 mg/dL — ABNORMAL HIGH (ref 8–23)
CO2: 20 mmol/L — ABNORMAL LOW (ref 22–32)
Calcium: 9 mg/dL (ref 8.9–10.3)
Chloride: 106 mmol/L (ref 98–111)
Creatinine, Ser: 3.2 mg/dL — ABNORMAL HIGH (ref 0.61–1.24)
GFR, Estimated: 19 mL/min — ABNORMAL LOW (ref >60)
Glucose, Bld: 119 mg/dL — ABNORMAL HIGH (ref 70–99)
Potassium: 3.9 mmol/L (ref 3.5–5.1)
Sodium: 138 mmol/L (ref 135–145)
Total Bilirubin: 1.1 mg/dL (ref 0.3–1.2)
Total Protein: 6 g/dL — ABNORMAL LOW (ref 6.5–8.1)

## 2020-04-22 LAB — GLUCOSE, CAPILLARY
Glucose-Capillary: 115 mg/dL — ABNORMAL HIGH (ref 70–99)
Glucose-Capillary: 135 mg/dL — ABNORMAL HIGH (ref 70–99)
Glucose-Capillary: 225 mg/dL — ABNORMAL HIGH (ref 70–99)
Glucose-Capillary: 167 mg/dL — ABNORMAL HIGH (ref 70–99)

## 2020-04-22 LAB — CBC
HCT: 26.4 % — ABNORMAL LOW (ref 39.0–52.0)
Hemoglobin: 8.5 g/dL — ABNORMAL LOW (ref 13.0–17.0)
MCH: 31 pg (ref 26.0–34.0)
MCHC: 32.2 g/dL (ref 30.0–36.0)
MCV: 96.4 fL (ref 80.0–100.0)
Platelets: 181 10*3/uL (ref 150–400)
RBC: 2.74 MIL/uL — ABNORMAL LOW (ref 4.22–5.81)
RDW: 14.6 % (ref 11.5–15.5)
WBC: 10.5 10*3/uL (ref 4.0–10.5)
nRBC: 0 % (ref 0.0–0.2)

## 2020-04-22 LAB — URIC ACID: Uric Acid, Serum: 11.4 mg/dL — ABNORMAL HIGH (ref 3.7–8.6)

## 2020-04-22 MED ORDER — PREDNISONE 20 MG PO TABS
40.0000 mg | ORAL_TABLET | Freq: Every day | ORAL | Status: AC
  Administered 2020-04-22 – 2020-04-23 (×2): 40 mg via ORAL
  Filled 2020-04-22 (×2): qty 2

## 2020-04-22 MED ORDER — COLCHICINE 0.6 MG PO TABS
0.6000 mg | ORAL_TABLET | Freq: Two times a day (BID) | ORAL | Status: AC
  Administered 2020-04-22 (×2): 0.6 mg via ORAL
  Filled 2020-04-22 (×2): qty 1

## 2020-04-22 MED ORDER — HYDRALAZINE HCL 20 MG/ML IJ SOLN
5.0000 mg | Freq: Four times a day (QID) | INTRAMUSCULAR | Status: DC | PRN
  Administered 2020-04-22 – 2020-04-26 (×2): 5 mg via INTRAVENOUS
  Filled 2020-04-22 (×2): qty 1

## 2020-04-22 MED ORDER — DARBEPOETIN ALFA 100 MCG/0.5ML IJ SOSY
100.0000 ug | PREFILLED_SYRINGE | Freq: Once | INTRAMUSCULAR | Status: AC
  Administered 2020-04-22: 100 ug via SUBCUTANEOUS
  Filled 2020-04-22: qty 0.5

## 2020-04-22 NOTE — Progress Notes (Signed)
[facility] Progress Note   79 y.o.malewith  CKD68following with Dr. Marval Regal) HTN, DM2 (insulin pump), HOCM, SSS s/p PPM, pAfib, OSA on CPAP, nonobstructive CAD, PVD, h/o CVA, HLDwho presents to [facility] progressively worsening SOB. Torsemide recently increased to 40mg  every other day alternating with 20mg .  Has been dealing with left wrist pain since AVF placement.  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 Marval Regal ([place]) with a baseline Cr ~2.5-3 -if there is no adequate diuresis with medical management then would consider initiating hemodialysis in-house but no indications at the moment. I did discuss this with him and he is okay with dialysis initiation if needed -Continue to monitor daily Cr, Dose meds for GFR<15 -Monitor Daily I/Os, Daily weights  -Maintain MAP>65 for optimal renal perfusion.  -Agree with holding ACE-I, avoid further nephrotoxins including NSAIDS, Morphine. Unless absolutely necessary, avoid CT with contrast and/or MRI with gadolinium. - Continue  Lasix 80mg  IV BID; still has e/o vol overload. BL weight is ~274-276 lbs.  Hypoxic Respiratory Distress, secondary to volume overload - being diuresed -would recommend repeating 2D Echo  Hypertension: -elevated on presentation, likely related to SOB/respiratory distress -resume home anti-HTNs, should also be getting benefit with diuresis  Metabolic Acidosis, secondary to advanced CKD -  nahco3 1300mg  TID  Anemia due tochronic kidney disease: -received aranesp 32mcg on 12/1; will give another 134mcg today. -check iron panel (has received feraheme in Jan 2021) ->   Feraheme 12/7 -transfuse for hgb <7  Diabetes Mellitus Type 2 with Hyperglycemia -management per primary service, hba1c previously low but not true a1c in the context of anemia  Secondary Hyperparathyroidism -resume home calcitriol, check  phos  Access: -s/p LUE BC AVF 04/16/2020 with Dr. Luan Pulling, there was an initial concern for steal syndrome which may be mild, can follow with [facility]VS outpatient unless pain worsens. Appears to be more wrist/ musculoskelelal than steal. Hand is warm. -LUE precautions  Subjective:   Feeling better. Denies f/c/n/v/ dyspnea.   Objective:   BP (!) 184/47 Comment: MD notified  Pulse 61   Temp 98.7 F (37.1 C)   Resp 20   Ht 6\' 4"  (1.93 m)   Wt 126.8 kg   SpO2 94%   BMI 34.03 kg/m   Intake/Output Summary (Last 24 hours) at 04/22/2020 1333 Last data filed at 04/21/2020 2147 Gross per 24 hour  Intake 300 ml  Output 1200 ml  Net -900 ml   Weight change:   Physical Exam: GEN: NAD, A&Ox3, NCAT HEENT: No conjunctival pallor, EOMI NECK: Supple, no thyromegaly LUNGS: CTA B/L no rales, rhonchi or wheezing CV: RRR, No M/R/G ABD: SNDNT +BS  EXT: tr-1+ lower extremity edema ACCESS: lt BCF  Imaging: DG Wrist Complete Left  Result Date: 04/20/2020 CLINICAL DATA:  Wrist pain procedure for fistula on LEFT arm with pain in LEFT wrist EXAM: LEFT WRIST - COMPLETE 3+ VIEW COMPARISON:  None FINDINGS: Extensive vascular calcifications project over the soft tissues of the LEFT wrist. Extensive radiocarpal joint space narrowing. Mild carpometacarpal degenerative changes of the first carpometacarpal joint. No sign of acute fracture or bone abnormality. IMPRESSION: 1. No acute fracture or dislocation. 2. Degenerative changes about the wrist. 3. Extensive vascular calcifications in the soft tissues of the LEFT wrist. Electronically Signed   By: Zetta Bills [name]   On: 04/20/2020 14:43   DG Chest Port 1 View  Result Date: 04/20/2020 CLINICAL DATA:  Dyspnea, shortness of breath history  of lower extremity edema, CHF and hypertension EXAM: PORTABLE CHEST 1 VIEW COMPARISON:  January 10, 2019 FINDINGS: LEFT-sided power pack for dual lead pacer device projects over LEFT chest. Trachea midline. Cardiomediastinal  contours and hilar structures are similar to the prior exam with central pulmonary vascular engorgement. Partially obscured LEFT hemidiaphragm. Graded opacity at RIGHT lung base. On limited assessment no acute skeletal process. IMPRESSION: LEFT lower lobe airspace disease, atelectasis or developing infection. 1. Potential small bilateral pleural effusions. Electronically Signed   By: Zetta Bills [name]   On: 04/20/2020 13:53   ECHOCARDIOGRAM COMPLETE  Result Date: 04/21/2020    ECHOCARDIOGRAM REPORT   Patient Name:   David [name] Date of Exam: 04/21/2020 Medical Rec #:  [id number]        Height:       76.0 in Accession #:    [id number]       Weight:       274.0 lb Date of Birth:  30-Apr-1941         BSA:          2.532 m Patient Age:    36 years         BP:           161/50 mmHg Patient Gender: M                HR:           79 bpm. Exam Location:  Inpatient Procedure: 2D Echo, Cardiac Doppler, Color Doppler and Intracardiac            Opacification Agent Indications:    CHF-Acute Diastolic 329.92 / E26.83  History:        Patient has prior history of Echocardiogram examinations, most                 recent 01/07/2019. CHF, CAD, Pacemaker, Arrythmias:Atrial                 Fibrillation, Signs/Symptoms:Dyspnea; Risk Factors:Hypertension,                 Dyslipidemia and Former Smoker. PVD.  Sonographer:    Vickie Epley RDCS Referring Phys: [id number] RONDELL A SMITH IMPRESSIONS  1. Left ventricular ejection fraction, by estimation, is 55 to 60%. The left ventricle has normal function. The left ventricle has no regional wall motion abnormalities. There is moderate concentric left ventricular hypertrophy. Left ventricular diastolic parameters are consistent with Grade II diastolic dysfunction (pseudonormalization). Elevated left atrial pressure.  2. Right ventricular systolic function is normal. The right ventricular size is normal. Mildly increased right ventricular wall thickness. Tricuspid regurgitation signal is  inadequate for assessing PA pressure.  3. Left atrial size was mildly dilated.  4. A small pericardial effusion is present. The pericardial effusion is circumferential. There is no evidence of cardiac tamponade.  5. The mitral valve is normal in structure. No evidence of mitral valve regurgitation.  6. 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 There is mild calcification of the aortic valve. There is moderate thickening of the aortic valve. Aortic valve regurgitation is trivial. Mild to moderate aortic valve sclerosis/calcification is present, without any evidence of aortic stenosis.  7. Aortic dilatation noted. There is mild dilatation of the aortic root, measuring 41 mm.  8. The inferior vena cava is dilated in size with >50% respiratory variability, suggesting right atrial pressure of 8 mmHg. FINDINGS  Left Ventricle: Left ventricular ejection fraction, by estimation, is 55 to 60%. The left ventricle has normal function.  The left ventricle has no regional wall motion abnormalities. Definity contrast agent was given IV to delineate the left ventricular  endocardial borders. The left ventricular internal cavity size was normal in size. There is moderate concentric left ventricular hypertrophy. Left ventricular diastolic parameters are consistent with Grade II diastolic dysfunction (pseudonormalization).  Elevated left atrial pressure. Right Ventricle: The right ventricular size is normal. Mildly increased right ventricular wall thickness. Right ventricular systolic function is normal. Tricuspid regurgitation signal is inadequate for assessing PA pressure. Left Atrium: Left atrial size was mildly dilated. Right Atrium: Right atrial size was normal in size. Pericardium: A small pericardial effusion is present. The pericardial effusion is circumferential. There is no evidence of cardiac tamponade. Mitral Valve: The mitral valve is normal in structure. No evidence of mitral valve regurgitation. Tricuspid Valve: The tricuspid valve  is normal in structure. Tricuspid valve regurgitation is not demonstrated. 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 There is mild calcification of the aortic valve. There is moderate thickening of the aortic valve. Aortic valve regurgitation is trivial. Mild to moderate aortic valve sclerosis/calcification is present, without any evidence of aortic stenosis. Aortic valve mean gradient measures 9.0 mmHg. Aortic valve peak gradient measures 14.7 mmHg. Aortic valve area, by VTI measures 3.09 cm. Pulmonic Valve: The pulmonic valve was grossly normal. Pulmonic valve regurgitation is not visualized. Aorta: Aortic dilatation noted. There is mild dilatation of the aortic root, measuring 41 mm. Venous: The inferior vena cava is dilated in size with greater than 50% respiratory variability, suggesting right atrial pressure of 8 mmHg. IAS/Shunts: No atrial level shunt detected by color flow Doppler. Additional Comments: A pacer wire is visualized.  LEFT VENTRICLE PLAX 2D LVIDd:         5.50 cm      Diastology LVIDs:         3.70 cm      LV e' medial:    4.13 cm/s LV PW:         1.50 cm      LV E/e' medial:  19.1 LV IVS:        1.60 cm      LV e' lateral:   5.70 cm/s LVOT diam:     2.40 cm      LV E/e' lateral: 13.8 LV SV:         138 LV SV Index:   54 LVOT Area:     4.52 cm  LV Volumes (MOD) LV vol d, MOD A2C: 229.0 ml LV vol d, MOD A4C: 178.0 ml LV vol s, MOD A2C: 87.8 ml LV vol s, MOD A4C: 56.3 ml LV SV MOD A2C:     141.2 ml LV SV MOD A4C:     178.0 ml LV SV MOD BP:      134.5 ml RIGHT VENTRICLE RV S prime:     12.90 cm/s TAPSE (M-mode): 2.5 cm LEFT ATRIUM             Index       RIGHT ATRIUM           Index LA diam:        4.30 cm 1.70 cm/m  RA Area:     22.80 cm LA Vol (A2C):   69.2 ml 27.33 ml/m RA Volume:   64.40 ml  25.43 ml/m LA Vol (A4C):   64.3 ml 25.39 ml/m LA Biplane Vol: 66.7 ml 26.34 ml/m  AORTIC VALVE AV Area (Vmax):    2.71 cm AV Area (Vmean):  2.55 cm AV Area (VTI):     3.09 cm AV Vmax:            192.00 cm/s AV Vmean:          142.000 cm/s AV VTI:            0.447 m AV Peak Grad:      14.7 mmHg AV Mean Grad:      9.0 mmHg LVOT Vmax:         115.00 cm/s LVOT Vmean:        79.900 cm/s LVOT VTI:          0.305 m LVOT/AV VTI ratio: 0.68  AORTA Ao Root diam: 4.20 cm Ao Asc diam:  3.50 cm MITRAL VALVE MV Area (PHT): 2.87 cm    SHUNTS MV Decel Time: 264 msec    Systemic VTI:  0.30 m MV E velocity: 78.80 cm/s  Systemic Diam: 2.40 cm MV A velocity: 75.40 cm/s MV E/A ratio:  1.05 Mihai Croitoru MD Electronically signed by Sanda Klein MD Signature Date/Time: 04/21/2020/11:12:47 AM    Final     Labs: BMET Recent Labs  Lab 04/16/20 0617 04/20/20 1315 04/20/20 1327 04/21/20 0115 04/22/20 0312  NA 143 139 141 139 138  K 4.1 4.1 4.0 3.9 3.9  CL 110 108  --  106 106  CO2  --  17*  --  20* 20*  GLUCOSE 104* 117*  --  188* 119*  BUN 59* 55*  --  61* 68*  CREATININE 2.90* 2.90*  --  3.07* 3.20*  CALCIUM  --  9.1  --  8.9 9.0  PHOS  --   --   --  4.5  --    CBC Recent Labs  Lab 04/20/20 1315 04/20/20 1327 04/21/20 0115 04/22/20 0312  WBC 11.9*  --  11.0* 10.5  HGB 9.0* 8.8* 8.2* 8.5*  HCT 28.7* 26.0* 25.9* 26.4*  MCV 99.7  --  97.0 96.4  PLT 183  --  170 181    Medications:    . amLODipine  5 mg Oral Daily  . atorvastatin  10 mg Oral QHS  . calcitRIOL  0.25 mcg Oral Daily  . carvedilol  25 mg Oral BID WC  . cholestyramine light  4 g Oral Daily  . clopidogrel  75 mg Oral Daily  . colchicine  0.6 mg Oral BID  . furosemide  80 mg Intravenous BID  . guaiFENesin  600 mg Oral BID  . heparin  5,000 Units Subcutaneous Q8H  . hydrALAZINE  100 mg Oral BID  . insulin aspart  0-15 Units Subcutaneous TID WC  . isosorbide mononitrate  60 mg Oral Daily  . predniSONE  40 mg Oral Q breakfast  . sertraline  100 mg Oral Q supper  . sodium bicarbonate  1,300 mg Oral TID  . sodium chloride flush  3 mL Intravenous Q12H      Otelia Santee, MD 04/22/2020, 1:33 PM

## 2020-04-22 NOTE — Evaluation (Signed)
Occupational Therapy Evaluation Patient Details Name: David [name] MRN: [id number] DOB: [birth year] Today's Date: 04/22/2020    History of Present Illness 79 y.o. 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    Clinical Impression   Pt admitted with the above diagnoses and presents with below problem list. Pt will benefit from continued acute OT to address the below listed deficits and maximize independence with basic ADLs prior to d/c to venue below. PLOF and home setup data provided by spouse via phone as pt is not a reliable historian at this time. PTA pt was limited to short, functional distances for ambulation utilizing walker and +1 assist, was needing increasing assist with bathing/dressing and bed mobility. Spouse endorses pt has some STM deficits at baseline but reports cognition today is worse than baseline. Pt presents with acutely impaired cognition/behaviors; at times restless, seems to wax and wane; decreased strength and activity tolerance. Pt needs max to total assist with basic ADLs, +2 assist with bed mobility. Spouse seems open to the idea of ST SNF for rehab if needed with goal of returning home after.      Follow Up Recommendations  SNF    Equipment Recommendations  Other (comment) (defer to next venue. if home then needs [facility] aide, hospital bed and likely w/c as well)    Recommendations for Other Services PT consult     Precautions / Restrictions Precautions Precautions: Fall Precaution Comments: h/o falls at home Restrictions Weight Bearing Restrictions: No      Mobility Bed Mobility               General bed mobility comments: Able to initate rolling to left side but needs +2 assist to power over onto side.      Transfers                 General transfer comment: unable to attempt this  session. would need +2 to attempt    Balance                                           ADL either performed or assessed with clinical judgement   ADL Overall ADL's : Needs assistance/impaired Eating/Feeding: Bed level;Maximal assistance   Grooming: Maximal assistance;Bed level   Upper Body Bathing: Total assistance;Bed level   Lower Body Bathing: +2 for physical assistance;Total assistance;Bed level   Upper Body Dressing : Total assistance;Bed level;Maximal assistance   Lower Body Dressing: Total assistance;+2 for physical assistance;Bed level                 General ADL Comments: Impaired cognition + generalized weakness + decreased activity tolerance. Bed level eval. Able to initate rolling to left side but needs +2 assist to power over onto side.      Vision Baseline Vision/History: Wears glasses       Perception     Praxis      Pertinent Vitals/Pain Pain Assessment: Faces Faces Pain Scale: Hurts a little bit Pain Location: unspecified. grimacing with BLE bed level ROm Pain Intervention(s): Monitored during session     Hand Dominance Right   Extremity/Trunk Assessment Upper Extremity Assessment Upper Extremity Assessment: LUE deficits/detail;Generalized weakness LUE Deficits / Details: edema noted forearm and distal.  r/o pain in L elbow. h/o L side hemiplegia, 3/5 strength at shoulder level. decreased proprioception.  LUE: Unable to fully assess due to pain LUE Sensation: decreased proprioception LUE Coordination: decreased gross motor;decreased fine motor   Lower Extremity Assessment Lower Extremity Assessment: Defer to PT evaluation;LLE deficits/detail;Generalized weakness;RLE deficits/detail LLE Deficits / Details: spouse endorses h/o L knee problems "just got a gel injection" on top of baseline L side hemiplegia       Communication Communication Communication: HOH   Cognition Arousal/Alertness: Awake/alert Behavior During  Therapy: Flat affect;Restless (wax and waning restlessness) Overall Cognitive Status: Impaired/Different from baseline Area of Impairment: Orientation;Attention;Memory;Following commands;Safety/judgement;Awareness;Problem solving                 Orientation Level: Place;Time;Situation ("1975") Current Attention Level: Focused Memory: Decreased recall of precautions;Decreased short-term memory Following Commands: Follows one step commands inconsistently Safety/Judgement: Decreased awareness of safety;Decreased awareness of deficits Awareness: Intellectual Problem Solving: Slow processing;Decreased initiation;Difficulty sequencing;Requires verbal cues;Requires tactile cues General Comments: Spoke with spouse, [name], after OT eval. She reports baseline STM deficits and some disorientation to time. She feels he is currently not at baseline with cognition/behaviors.    General Comments       Exercises     Shoulder Instructions      Home Living Family/patient expects to be discharged to:: Private residence Living Arrangements: Spouse/significant other Available Help at Discharge: Family Type of Home: House (townhouse) Home Access: Stairs to enter Technical brewer of Steps: 1+1 Entrance Stairs-Rails: Right Home Layout: One level     Bathroom Shower/Tub: Tub/shower unit         Home Equipment: Environmental consultant - 2 wheels;Walker - 4 wheels;Shower seat   Additional Comments: Lives with spouse. Son and daughter in law live a few minutes away. Daughter lives in same townhouse complex but has medical limitiations.      Prior Functioning/Environment Level of Independence: Needs assistance  Gait / Transfers Assistance Needed: walker with assist to walk short, functional distances (ie bed to bathroom). +1 assist at baseline. son has been physically assisting with tub shower transfers.  ADL's / Homemaking Assistance Needed: some +1 assist with bathing/dressing. has been struggling more  recently with bed mobility and functional transfers.             OT Problem List: Decreased strength;Decreased range of motion;Decreased activity tolerance;Impaired balance (sitting and/or standing);Decreased coordination;Decreased cognition;Decreased safety awareness;Decreased knowledge of use of DME or AE;Decreased knowledge of precautions;Impaired tone;Obesity;Impaired UE functional use;Pain;Increased edema      OT Treatment/Interventions: Self-care/ADL training;Therapeutic exercise;Energy conservation;DME and/or AE instruction;Therapeutic activities;Cognitive remediation/compensation;Patient/family education;Balance training    OT Goals(Current goals can be found in the care plan section) Acute Rehab OT Goals Patient Stated Goal: not stated OT Goal Formulation: With patient/family Time For Goal Achievement: 05/06/20 Potential to Achieve Goals: Good ADL Goals Pt Will Perform Eating: bed level;with supervision Pt Will Perform Grooming: with min assist;bed level Pt Will Perform Upper Body Bathing: with max assist;sitting Pt Will Perform Lower Body Bathing: with max assist;sitting/lateral leans Pt Will Transfer to Toilet: with mod assist;with +2 assist;stand pivot transfer;bedside commode Additional ADL Goal #1: Pt will complete bed mobility at min A level to prepare for EOB/OOB ADLs. Additional ADL Goal #2: Pt will follow one step commands with 75% accuracy during functional task.  OT Frequency: Min 2X/week   Barriers to D/C:            Co-evaluation              AM-PAC  OT "6 Clicks" Daily Activity     Outcome Measure Help from another person eating meals?: A Lot Help from another person taking care of personal grooming?: Total Help from another person toileting, which includes using toliet, bedpan, or urinal?: Total Help from another person bathing (including washing, rinsing, drying)?: Total Help from another person to put on and taking off regular upper body  clothing?: Total Help from another person to put on and taking off regular lower body clothing?: Total 6 Click Score: 7   End of Session Nurse Communication: Other (comment);Mobility status (PLOF per spouse via phone)  Activity Tolerance: Patient limited by fatigue;Other (comment) (acutely impaired cognition (delirium?)) Patient left: in bed;with call bell/phone within reach;with bed alarm set  OT Visit Diagnosis: Unsteadiness on feet (R26.81);Muscle weakness (generalized) (M62.81);Pain;Other abnormalities of gait and mobility (R26.89);Other symptoms and signs involving cognitive function;Hemiplegia and hemiparesis;History of falling (Z91.81) Hemiplegia - Right/Left: Left Hemiplegia - dominant/non-dominant: Non-Dominant Hemiplegia - caused by: Cerebral infarction                Time: 1304-1330 OT Time Calculation (min): 26 min Charges:  OT General Charges $OT Visit: 1 Visit OT Evaluation $OT Eval Moderate Complexity: 1 Mod OT Treatments $Self Care/Home Management : 8-22 mins  Tyrone Schimke, OT Acute Rehabilitation Services Pager: [phone] Office: [phone]   Hortencia Pilar 04/22/2020, 2:29 PM

## 2020-04-22 NOTE — Plan of Care (Signed)
  Problem: Coping: Goal: Level of anxiety will decrease Outcome: Progressing   Problem: Pain Managment: Goal: General experience of comfort will improve Outcome: Progressing   Problem: Safety: Goal: Ability to remain free from injury will improve Outcome: Progressing   

## 2020-04-22 NOTE — Progress Notes (Signed)
PROGRESS NOTE    MARKEEM NOREEN  MGQ:[id number] DOB: 1941/04/24 DOA: 04/20/2020 PCP: [name], MD  Brief Narrative:79 y.o.malewith medical history significant ofSA node dysfunctions/ppacemaker, CAD, CKD stage IV, HTN, HLD, CVA, IDDM on insulin, OSA on CPAP, and PVD presents with complaints of a shortness of breath. Patient reports that he has had progressively worsening shortness of breath over the last 1 month, but had recently became worse. Pt is followed by Nephrology for CKD, recently having AVF placed. Pt reported increasing cough and sob   Assessment & Plan:   Fluid overload Progressive CKD 4, metabolic acidosis Acute on chronic diastolic CHF -Clinically improving with diuresis, echo with EF of 55% -Continue IV Lasix 80 mg every 12 -Nephrology following, recent AV fistula placement on 12/2, kidney function is stable -Monitor I's/O, daily weights  Left wrist swelling and tenderness -Clinically suspect gout, check uric acid, colchicine and prednisone x1 today  Hypertensive urgency -Improving with diuresis, continue amlodipine, Coreg, hydralazine and Imdur  Anemia of chronic disease -Given Feraheme -Monitor  Type 2 diabetes mellitus with hyperglycemia -Last hemoglobin A1c was 6.5 -Continue sliding scale insulin  DVT prophylaxis: Heparin subcutaneous Code Status: Full code Family Communication: Wife at bedside Disposition Plan:  Status is: Inpatient  Remains inpatient appropriate because:Inpatient level of care appropriate due to severity of illness   Dispo: The patient is from: Home              Anticipated d/c is to: Home              Anticipated d/c date is: 3 days              Patient currently is not medically stable to d/c.  Consultants:   Renal   Procedures:   Antimicrobials:    Subjective: -Complains of pain and swelling of his wrist left wrist  Objective: Vitals:   04/21/20 1543 04/21/20 2114 04/21/20 2227 04/22/20 0551  BP: (!)  165/57 129/88  (!) 136/117  Pulse: 60 62 (!) 56 61  Resp: 17 19 19 18   Temp: 97.8 F (36.6 C) 99.5 F (37.5 C)  98.8 F (37.1 C)  TempSrc:  Oral  Oral  SpO2: 93% 93% 97% 90%  Weight:    126.8 kg    Intake/Output Summary (Last 24 hours) at 04/22/2020 1201 Last data filed at 04/21/2020 2147 Gross per 24 hour  Intake 300 ml  Output 1200 ml  Net -900 ml   Filed Weights   04/22/20 0551  Weight: 126.8 kg    Examination:  General exam: Obese pleasant male sitting up in bed, AAOx3 no distress HEENT: Neck obese unable to assess JVD CVS: S1-S2, regular rate rhythm Lungs: Decreased breath sounds the bases Abdomen: Soft, obese, nontender, bowel sounds present Extremities: Trace edema, left arm with AV fistula, left wrist with swelling and tenderness Skin: No rashes on exposed skin Psychiatry:  Mood & affect appropriate.     Data Reviewed:   CBC: Recent Labs  Lab 04/16/20 0617 04/20/20 1315 04/20/20 1327 04/21/20 0115 04/22/20 0312  WBC  --  11.9*  --  11.0* 10.5  HGB 9.9* 9.0* 8.8* 8.2* 8.5*  HCT 29.0* 28.7* 26.0* 25.9* 26.4*  MCV  --  99.7  --  97.0 96.4  PLT  --  183  --  170 267   Basic Metabolic Panel: Recent Labs  Lab 04/16/20 0617 04/20/20 1315 04/20/20 1327 04/21/20 0115 04/22/20 0312  NA 143 139 141 139 138  K 4.1  4.1 4.0 3.9 3.9  CL 110 108  --  106 106  CO2  --  17*  --  20* 20*  GLUCOSE 104* 117*  --  188* 119*  BUN 59* 55*  --  61* 68*  CREATININE 2.90* 2.90*  --  3.07* 3.20*  CALCIUM  --  9.1  --  8.9 9.0  PHOS  --   --   --  4.5  --    GFR: Estimated Creatinine Clearance: 27.2 mL/min (A) (by C-G formula based on SCr of 3.2 mg/dL (H)). Liver Function Tests: Recent Labs  Lab 04/21/20 0115 04/22/20 0312  AST  --  16  ALT  --  8  ALKPHOS  --  80  BILITOT  --  1.1  PROT  --  6.0*  ALBUMIN 2.6* 2.6*   No results for input(s): LIPASE, AMYLASE in the last 168 hours. No results for input(s): AMMONIA in the last 168 hours. Coagulation  Profile: No results for input(s): INR, PROTIME in the last 168 hours. Cardiac Enzymes: No results for input(s): CKTOTAL, CKMB, CKMBINDEX, TROPONINI in the last 168 hours. BNP (last 3 results) No results for input(s): PROBNP in the last 8760 hours. HbA1C: No results for input(s): HGBA1C in the last 72 hours. CBG: Recent Labs  Lab 04/21/20 0756 04/21/20 1239 04/21/20 1544 04/21/20 2033 04/22/20 0738  GLUCAP 115* 159* 114* 100* 115*   Lipid Profile: No results for input(s): CHOL, HDL, LDLCALC, TRIG, CHOLHDL, LDLDIRECT in the last 72 hours. Thyroid Function Tests: No results for input(s): TSH, T4TOTAL, FREET4, T3FREE, THYROIDAB in the last 72 hours. Anemia Panel: Recent Labs    04/21/20 0115  FERRITIN 542*  TIBC 157*  IRON 24*   Urine analysis:    Component Value Date/Time   COLORURINE STRAW (A) 01/09/2019 1900   APPEARANCEUR CLEAR 01/09/2019 1900   LABSPEC 1.009 01/09/2019 1900   PHURINE 5.0 01/09/2019 1900   GLUCOSEU NEGATIVE 01/09/2019 1900   GLUCOSEU NEGATIVE 03/03/2015 1501   HGBUR NEGATIVE 01/09/2019 1900   BILIRUBINUR NEGATIVE 01/09/2019 1900   KETONESUR NEGATIVE 01/09/2019 1900   PROTEINUR NEGATIVE 01/09/2019 1900   UROBILINOGEN 0.2 03/03/2015 1501   NITRITE NEGATIVE 01/09/2019 1900   LEUKOCYTESUR NEGATIVE 01/09/2019 1900   Sepsis Labs: @LABRCNTIP (procalcitonin:4,lacticidven:4)  ) Recent Results (from the past 240 hour(s))  SARS CORONAVIRUS 2 (TAT 6-24 HRS) Nasopharyngeal Nasopharyngeal Swab     Status: None   Collection Time: 04/14/20  1:52 PM   Specimen: Nasopharyngeal Swab  Result Value Ref Range Status   SARS Coronavirus 2 NEGATIVE NEGATIVE Final    Comment: (NOTE) SARS-CoV-2 target nucleic acids are NOT DETECTED.  The SARS-CoV-2 RNA is generally detectable in upper and lower respiratory specimens during the acute phase of infection. Negative results do not preclude SARS-CoV-2 infection, do not rule out co-infections with other pathogens, and  should not be used as the sole basis for treatment or other patient management decisions. Negative results must be combined with clinical observations, patient history, and epidemiological information. The expected result is Negative.  Fact Sheet for Patients: SugarRoll.be  Fact Sheet for Healthcare Providers: https://www.woods-mathews.com/  This test is not yet approved or cleared by the [place] FDA and  has been authorized for detection and/or diagnosis of SARS-CoV-2 by FDA under an Emergency Use Authorization (EUA). This EUA will remain  in effect (meaning this test can be used) for the duration of the COVID-19 declaration under Se ction 564(b)(1) of the Act, 21 U.S.C. section 360bbb-3(b)(1), unless the  authorization is terminated or revoked sooner.  Performed at [facility], [place] [address] [place], [place] [address]   Resp Panel by RT-PCR (Flu A&B, Covid) Nasopharyngeal Swab     Status: None   Collection Time: 04/20/20  1:10 PM   Specimen: Nasopharyngeal Swab; Nasopharyngeal(NP) swabs in vial transport medium  Result Value Ref Range Status   SARS Coronavirus 2 by RT PCR NEGATIVE NEGATIVE Final    Comment: (NOTE) SARS-CoV-2 target nucleic acids are NOT DETECTED.  The SARS-CoV-2 RNA is generally detectable in upper respiratory specimens during the acute phase of infection. The lowest concentration of SARS-CoV-2 viral copies this assay can detect is 138 copies/mL. A negative result does not preclude SARS-Cov-2 infection and should not be used as the sole basis for treatment or other patient management decisions. A negative result may occur with  improper specimen collection/handling, submission of specimen other than nasopharyngeal swab, presence of viral mutation(s) within the areas targeted by this assay, and inadequate number of viral copies(<138 copies/mL). A negative result must be combined with clinical  observations, patient history, and epidemiological information. The expected result is Negative.  Fact Sheet for Patients:  EntrepreneurPulse.com.au  Fact Sheet for Healthcare Providers:  IncredibleEmployment.be  This test is no t yet approved or cleared by the [place] FDA and  has been authorized for detection and/or diagnosis of SARS-CoV-2 by FDA under an Emergency Use Authorization (EUA). This EUA will remain  in effect (meaning this test can be used) for the duration of the COVID-19 declaration under Section 564(b)(1) of the Act, 21 U.S.C.section 360bbb-3(b)(1), unless the authorization is terminated  or revoked sooner.       Influenza A by PCR NEGATIVE NEGATIVE Final   Influenza B by PCR NEGATIVE NEGATIVE Final    Comment: (NOTE) The Xpert Xpress SARS-CoV-2/FLU/RSV plus assay is intended as an aid in the diagnosis of influenza from Nasopharyngeal swab specimens and should not be used as a sole basis for treatment. Nasal washings and aspirates are unacceptable for Xpert Xpress SARS-CoV-2/FLU/RSV testing.  Fact Sheet for Patients: EntrepreneurPulse.com.au  Fact Sheet for Healthcare Providers: IncredibleEmployment.be  This test is not yet approved or cleared by the [place] FDA and has been authorized for detection and/or diagnosis of SARS-CoV-2 by FDA under an Emergency Use Authorization (EUA). This EUA will remain in effect (meaning this test can be used) for the duration of the COVID-19 declaration under Section 564(b)(1) of the Act, 21 U.S.C. section 360bbb-3(b)(1), unless the authorization is terminated or revoked.  Performed at [facility], [place] [address] [place], [place] [address]   Blood culture (routine x 2)     Status: None (Preliminary result)   Collection Time: 04/20/20  6:13 PM   Specimen: Site Not Specified; Blood  Result Value Ref Range Status   Specimen  Description SITE NOT SPECIFIED  Final   Special Requests   Final    BOTTLES DRAWN AEROBIC AND ANAEROBIC Blood Culture adequate volume   Culture   Final    NO GROWTH 2 DAYS Performed at [facility], [address], [place], [place] [address]    Report Status PENDING  Incomplete  Blood culture (routine x 2)     Status: None (Preliminary result)   Collection Time: 04/20/20  6:17 PM   Specimen: Site Not Specified; Blood  Result Value Ref Range Status   Specimen Description SITE NOT SPECIFIED  Final   Special Requests   Final    BOTTLES  DRAWN AEROBIC AND ANAEROBIC Blood Culture adequate volume   Culture   Final    NO GROWTH 2 DAYS Performed at [facility], [place] [address], [place], [place] [address]    Report Status PENDING  Incomplete         Radiology Studies: DG Wrist Complete Left  Result Date: 04/20/2020 CLINICAL DATA:  Wrist pain procedure for fistula on LEFT arm with pain in LEFT wrist EXAM: LEFT WRIST - COMPLETE 3+ VIEW COMPARISON:  None FINDINGS: Extensive vascular calcifications project over the soft tissues of the LEFT wrist. Extensive radiocarpal joint space narrowing. Mild carpometacarpal degenerative changes of the first carpometacarpal joint. No sign of acute fracture or bone abnormality. IMPRESSION: 1. No acute fracture or dislocation. 2. Degenerative changes about the wrist. 3. Extensive vascular calcifications in the soft tissues of the LEFT wrist. Electronically Signed   By: Zetta Bills [name]   On: 04/20/2020 14:43   DG Chest Port 1 View  Result Date: 04/20/2020 CLINICAL DATA:  Dyspnea, shortness of breath history of lower extremity edema, CHF and hypertension EXAM: PORTABLE CHEST 1 VIEW COMPARISON:  January 10, 2019 FINDINGS: LEFT-sided power pack for dual lead pacer device projects over LEFT chest. Trachea midline. Cardiomediastinal contours and hilar structures are similar to the prior exam with central pulmonary vascular engorgement. Partially obscured  LEFT hemidiaphragm. Graded opacity at RIGHT lung base. On limited assessment no acute skeletal process. IMPRESSION: LEFT lower lobe airspace disease, atelectasis or developing infection. 1. Potential small bilateral pleural effusions. Electronically Signed   By: Zetta Bills [name]   On: 04/20/2020 13:53   ECHOCARDIOGRAM COMPLETE  Result Date: 04/21/2020    ECHOCARDIOGRAM REPORT   Patient Name:   Barrington MASAYUKI SAKAI Date of Exam: 04/21/2020 Medical Rec #:  [id number]        Height:       76.0 in Accession #:    [id number]       Weight:       274.0 lb Date of Birth:  21-Oct-1940         BSA:          2.532 m Patient Age:    69 years         BP:           161/50 mmHg Patient Gender: M                HR:           79 bpm. Exam Location:  Inpatient Procedure: 2D Echo, Cardiac Doppler, Color Doppler and Intracardiac            Opacification Agent Indications:    CHF-Acute Diastolic 778.24 / M35.36  History:        Patient has prior history of Echocardiogram examinations, most                 recent 01/07/2019. CHF, CAD, Pacemaker, Arrythmias:Atrial                 Fibrillation, Signs/Symptoms:Dyspnea; Risk Factors:Hypertension,                 Dyslipidemia and Former Smoker. PVD.  Sonographer:    Vickie Epley RDCS Referring Phys: [id number] RONDELL A SMITH IMPRESSIONS  1. Left ventricular ejection fraction, by estimation, is 55 to 60%. The left ventricle has normal function. The left ventricle has no regional wall motion abnormalities. There is moderate concentric left ventricular hypertrophy. Left ventricular diastolic parameters are consistent with Grade II  diastolic dysfunction (pseudonormalization). Elevated left atrial pressure.  2. Right ventricular systolic function is normal. The right ventricular size is normal. Mildly increased right ventricular wall thickness. Tricuspid regurgitation signal is inadequate for assessing PA pressure.  3. Left atrial size was mildly dilated.  4. A small pericardial effusion is present.  The pericardial effusion is circumferential. There is no evidence of cardiac tamponade.  5. The mitral valve is normal in structure. No evidence of mitral valve regurgitation.  6. 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 There is mild calcification of the aortic valve. There is moderate thickening of the aortic valve. Aortic valve regurgitation is trivial. Mild to moderate aortic valve sclerosis/calcification is present, without any evidence of aortic stenosis.  7. Aortic dilatation noted. There is mild dilatation of the aortic root, measuring 41 mm.  8. The inferior vena cava is dilated in size with >50% respiratory variability, suggesting right atrial pressure of 8 mmHg. FINDINGS  Left Ventricle: Left ventricular ejection fraction, by estimation, is 55 to 60%. The left ventricle has normal function. The left ventricle has no regional wall motion abnormalities. Definity contrast agent was given IV to delineate the left ventricular  endocardial borders. The left ventricular internal cavity size was normal in size. There is moderate concentric left ventricular hypertrophy. Left ventricular diastolic parameters are consistent with Grade II diastolic dysfunction (pseudonormalization).  Elevated left atrial pressure. Right Ventricle: The right ventricular size is normal. Mildly increased right ventricular wall thickness. Right ventricular systolic function is normal. Tricuspid regurgitation signal is inadequate for assessing PA pressure. Left Atrium: Left atrial size was mildly dilated. Right Atrium: Right atrial size was normal in size. Pericardium: A small pericardial effusion is present. The pericardial effusion is circumferential. There is no evidence of cardiac tamponade. Mitral Valve: The mitral valve is normal in structure. No evidence of mitral valve regurgitation. Tricuspid Valve: The tricuspid valve is normal in structure. Tricuspid valve regurgitation is not demonstrated. 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  There is mild calcification of the aortic valve. There is moderate thickening of the aortic valve. Aortic valve regurgitation is trivial. Mild to moderate aortic valve sclerosis/calcification is present, without any evidence of aortic stenosis. Aortic valve mean gradient measures 9.0 mmHg. Aortic valve peak gradient measures 14.7 mmHg. Aortic valve area, by VTI measures 3.09 cm. Pulmonic Valve: The pulmonic valve was grossly normal. Pulmonic valve regurgitation is not visualized. Aorta: Aortic dilatation noted. There is mild dilatation of the aortic root, measuring 41 mm. Venous: The inferior vena cava is dilated in size with greater than 50% respiratory variability, suggesting right atrial pressure of 8 mmHg. IAS/Shunts: No atrial level shunt detected by color flow Doppler. Additional Comments: A pacer wire is visualized.  LEFT VENTRICLE PLAX 2D LVIDd:         5.50 cm      Diastology LVIDs:         3.70 cm      LV e' medial:    4.13 cm/s LV PW:         1.50 cm      LV E/e' medial:  19.1 LV IVS:        1.60 cm      LV e' lateral:   5.70 cm/s LVOT diam:     2.40 cm      LV E/e' lateral: 13.8 LV SV:         138 LV SV Index:   54 LVOT Area:     4.52 cm  LV Volumes (  MOD) LV vol d, MOD A2C: 229.0 ml LV vol d, MOD A4C: 178.0 ml LV vol s, MOD A2C: 87.8 ml LV vol s, MOD A4C: 56.3 ml LV SV MOD A2C:     141.2 ml LV SV MOD A4C:     178.0 ml LV SV MOD BP:      134.5 ml RIGHT VENTRICLE RV S prime:     12.90 cm/s TAPSE (M-mode): 2.5 cm LEFT ATRIUM             Index       RIGHT ATRIUM           Index LA diam:        4.30 cm 1.70 cm/m  RA Area:     22.80 cm LA Vol (A2C):   69.2 ml 27.33 ml/m RA Volume:   64.40 ml  25.43 ml/m LA Vol (A4C):   64.3 ml 25.39 ml/m LA Biplane Vol: 66.7 ml 26.34 ml/m  AORTIC VALVE AV Area (Vmax):    2.71 cm AV Area (Vmean):   2.55 cm AV Area (VTI):     3.09 cm AV Vmax:           192.00 cm/s AV Vmean:          142.000 cm/s AV VTI:            0.447 m AV Peak Grad:      14.7 mmHg AV Mean Grad:       9.0 mmHg LVOT Vmax:         115.00 cm/s LVOT Vmean:        79.900 cm/s LVOT VTI:          0.305 m LVOT/AV VTI ratio: 0.68  AORTA Ao Root diam: 4.20 cm Ao Asc diam:  3.50 cm MITRAL VALVE MV Area (PHT): 2.87 cm    SHUNTS MV Decel Time: 264 msec    Systemic VTI:  0.30 m MV E velocity: 78.80 cm/s  Systemic Diam: 2.40 cm MV A velocity: 75.40 cm/s MV E/A ratio:  1.05 Mihai Croitoru MD Electronically signed by Sanda Klein MD Signature Date/Time: 04/21/2020/11:12:47 AM    Final         Scheduled Meds: . amLODipine  5 mg Oral Daily  . atorvastatin  10 mg Oral QHS  . calcitRIOL  0.25 mcg Oral Daily  . carvedilol  25 mg Oral BID WC  . cholestyramine light  4 g Oral Daily  . clopidogrel  75 mg Oral Daily  . colchicine  0.6 mg Oral BID  . furosemide  80 mg Intravenous BID  . guaiFENesin  600 mg Oral BID  . heparin  5,000 Units Subcutaneous Q8H  . hydrALAZINE  100 mg Oral BID  . insulin aspart  0-15 Units Subcutaneous TID WC  . isosorbide mononitrate  60 mg Oral Daily  . predniSONE  40 mg Oral Q breakfast  . sertraline  100 mg Oral Q supper  . sodium bicarbonate  1,300 mg Oral TID  . sodium chloride flush  3 mL Intravenous Q12H   Continuous Infusions:   LOS: 2 days    Time spent: 23min  Domenic Polite, MD [facility]  04/22/2020, 12:01 PM

## 2020-04-22 NOTE — Plan of Care (Signed)
  Problem: Safety: Goal: Ability to remain free from injury will improve Outcome: Progressing   Problem: Skin Integrity: Goal: Risk for impaired skin integrity will decrease Outcome: Progressing   Problem: Elimination: Goal: Will not experience complications related to urinary retention Outcome: Progressing

## 2020-04-23 DIAGNOSIS — I69392 Facial weakness following cerebral infarction: Secondary | ICD-10-CM | POA: Diagnosis not present

## 2020-04-23 LAB — CBC
HCT: 27.2 % — ABNORMAL LOW (ref 39.0–52.0)
Hemoglobin: 8.8 g/dL — ABNORMAL LOW (ref 13.0–17.0)
MCH: 31 pg (ref 26.0–34.0)
MCHC: 32.4 g/dL (ref 30.0–36.0)
MCV: 95.8 fL (ref 80.0–100.0)
Platelets: 189 10*3/uL (ref 150–400)
RBC: 2.84 MIL/uL — ABNORMAL LOW (ref 4.22–5.81)
RDW: 14.2 % (ref 11.5–15.5)
WBC: 10.8 10*3/uL — ABNORMAL HIGH (ref 4.0–10.5)
nRBC: 0 % (ref 0.0–0.2)

## 2020-04-23 LAB — BASIC METABOLIC PANEL
Anion gap: 17 — ABNORMAL HIGH (ref 5–15)
BUN: 78 mg/dL — ABNORMAL HIGH (ref 8–23)
CO2: 19 mmol/L — ABNORMAL LOW (ref 22–32)
Calcium: 9.1 mg/dL (ref 8.9–10.3)
Chloride: 102 mmol/L (ref 98–111)
Creatinine, Ser: 3.29 mg/dL — ABNORMAL HIGH (ref 0.61–1.24)
GFR, Estimated: 18 mL/min — ABNORMAL LOW (ref >60)
Glucose, Bld: 209 mg/dL — ABNORMAL HIGH (ref 70–99)
Potassium: 4 mmol/L (ref 3.5–5.1)
Sodium: 138 mmol/L (ref 135–145)

## 2020-04-23 LAB — GLUCOSE, CAPILLARY
Glucose-Capillary: 184 mg/dL — ABNORMAL HIGH (ref 70–99)
Glucose-Capillary: 241 mg/dL — ABNORMAL HIGH (ref 70–99)
Glucose-Capillary: 253 mg/dL — ABNORMAL HIGH (ref 70–99)

## 2020-04-23 NOTE — Progress Notes (Signed)
    RE:  David [name]       Date of Birth:  10/08/1940     Date:   04/23/20       To Whom It May Concern:  Please be advised that the above-named patient will require a short-term nursing home stay - anticipated 30 days or less for rehabilitation and strengthening.  The plan is for return home.                 MD signature                Date

## 2020-04-23 NOTE — Plan of Care (Signed)

## 2020-04-23 NOTE — Care Management Important Message (Signed)
Important Message  Patient Details  Name: David [name] MRN: [id number] Date of Birth: 01/22/1941   Medicare Important Message Given:  Yes     Othmar Ringer 04/23/2020, 2:25 PM

## 2020-04-23 NOTE — NC FL2 (Signed)
[place] LEVEL OF CARE SCREENING TOOL     IDENTIFICATION  Patient Name: David [name] Birthdate: 1941-02-20 Sex: male Admission Date (Current Location): 04/20/2020  [facility] and [place] Number:  Herbalist and Address:  [facility] [place], [place] [address] [place], [place] [address]      Provider Number: [id number]  Attending Physician Name and Address:  Domenic Polite, MD  Relative Name and Phone Number:  [place], [place] [phone]    Current Level of Care: Hospital Recommended Level of Care: [place] Prior Approval Number:    Date Approved/Denied:   PASRR Number:    Discharge Plan: SNF    Current Diagnoses: Patient Active Problem List   Diagnosis Date Noted  . Acute respiratory failure with hypoxia ([place]) 04/20/2020  . Fluid overload 04/20/2020  . Hypertensive urgency 04/20/2020  . Leukocytosis 04/20/2020  . History of CVA (cerebrovascular accident) 04/20/2020  . Debility 03/20/2020  . Osteoarthritis 03/13/2020  . Diarrhea 02/18/2019  . GERD (gastroesophageal reflux disease) 01/24/2019  . Memory change 11/19/2018  . Senile purpura ([place]) 11/27/2017  . CKD (chronic kidney disease) stage 4, GFR 15-29 ml/min (HCC) 09/26/2017  . 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 Nonobstructive CAD s/p PCI 2012   . Cardiac pacemaker in situ   . PVD (peripheral vascular disease) ([place])   . Anxiety 03/22/2017  . Acute cholecystitis s/p lap cholecystectomy 03/05/2017 03/04/2017  . 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[place]) 03/04/2017  . 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 Fatigue 01/27/2017  . Low vitamin B12 level 01/27/2017  . OSA on CPAP 10/26/2014  . Seasonal and perennial allergic rhinitis 10/25/2013  . Hypertension associated with diabetes ([place]) 10/25/2013  . Hyperlipidemia associated with type 2 diabetes mellitus ([place])  10/25/2013    Orientation RESPIRATION BLADDER Height & Weight     Self,Time,Place  O2 Incontinent Weight: 279 lb 8.7 oz (126.8 kg) Height:  6\' 4"  (193 cm)  BEHAVIORAL SYMPTOMS/MOOD NEUROLOGICAL BOWEL NUTRITION STATUS      Continent Diet (renal/carb modified.  See discharge summary)  AMBULATORY STATUS COMMUNICATION OF NEEDS Skin   Total Care Verbally Skin abrasions                       Personal Care Assistance Level of Assistance  Bathing,Feeding,Dressing Bathing Assistance: Maximum assistance Feeding assistance: Maximum assistance Dressing Assistance: Maximum assistance     Functional Limitations Info  Sight,Hearing,Speech Sight Info: Adequate Hearing Info: Adequate Speech Info: Adequate    SPECIAL CARE FACTORS FREQUENCY  PT (By licensed PT),OT (By licensed OT)     PT Frequency: 5x week OT Frequency: 5x week            Contractures Contractures Info: Not present    Additional Factors Info  Code Status,Allergies,Insulin Sliding Scale Code Status Info: full Allergies Info: ativan, adhesive tape   Insulin Sliding Scale Info: novolog 0-15 units 3x day with meals.       Current Medications (04/23/2020):  This is the current hospital active medication list Current Facility-Administered Medications  Medication Dose Route Frequency Provider Last Rate Last Admin  . acetaminophen (TYLENOL) tablet 650 mg  650 mg Oral Q6H PRN Fuller Plan A, MD   650 mg at 04/23/20 1109   Or  . acetaminophen (TYLENOL) suppository 650 mg  650 mg Rectal Q6H PRN Norval Morton, MD      . albuterol (  PROVENTIL) (2.5 MG/3ML) 0.083% nebulizer solution 2.5 mg  2.5 mg Nebulization Q6H PRN Smith, Rondell A, MD      . amLODipine (NORVASC) tablet 5 mg  5 mg Oral Daily Smith, Rondell A, MD   5 mg at 04/23/20 1006  . atorvastatin (LIPITOR) tablet 10 mg  10 mg Oral QHS Smith, Rondell A, MD   10 mg at 04/22/20 2046  . calcitRIOL (ROCALTROL) capsule 0.25 mcg  0.25 mcg Oral Daily Tamala Julian, Rondell  A, MD   0.25 mcg at 04/23/20 1006  . carvedilol (COREG) tablet 25 mg  25 mg Oral BID WC Smith, Rondell A, MD   25 mg at 04/23/20 1004  . cholestyramine light (PREVALITE) packet 4 g  4 g Oral Daily Fuller Plan A, MD   4 g at 04/22/20 0808  . clopidogrel (PLAVIX) tablet 75 mg  75 mg Oral Daily Smith, Rondell A, MD   75 mg at 04/23/20 1007  . furosemide (LASIX) injection 80 mg  80 mg Intravenous BID Fuller Plan A, MD   80 mg at 04/23/20 1015  . guaiFENesin (MUCINEX) 12 hr tablet 600 mg  600 mg Oral BID Tamala Julian, Rondell A, MD   600 mg at 04/23/20 1007  . heparin injection 5,000 Units  5,000 Units Subcutaneous Q8H Smith, Rondell A, MD   5,000 Units at 04/23/20 0531  . hydrALAZINE (APRESOLINE) injection 5 mg  5 mg Intravenous Q6H PRN Domenic Polite, MD   5 mg at 04/22/20 1345  . hydrALAZINE (APRESOLINE) tablet 100 mg  100 mg Oral BID Fuller Plan A, MD   100 mg at 04/23/20 1007  . insulin aspart (novoLOG) injection 0-15 Units  0-15 Units Subcutaneous TID WC Fuller Plan A, MD   5 Units at 04/23/20 1302  . isosorbide mononitrate (IMDUR) 24 hr tablet 60 mg  60 mg Oral Daily Smith, Rondell A, MD   60 mg at 04/23/20 1004  . ondansetron (ZOFRAN) tablet 4 mg  4 mg Oral Q6H PRN Fuller Plan A, MD       Or  . ondansetron (ZOFRAN) injection 4 mg  4 mg Intravenous Q6H PRN Smith, Rondell A, MD      . sertraline (ZOLOFT) tablet 100 mg  100 mg Oral Q supper Fuller Plan A, MD   100 mg at 04/22/20 1617  . sodium bicarbonate tablet 1,300 mg  1,300 mg Oral TID Fuller Plan A, MD   1,300 mg at 04/23/20 1005  . sodium chloride flush (NS) 0.9 % injection 3 mL  3 mL Intravenous Q12H Smith, Rondell A, MD   3 mL at 04/23/20 1015  . traMADol (ULTRAM) tablet 50 mg  50 mg Oral Q12H PRN Norval Morton, MD   50 mg at 04/22/20 2045     Discharge Medications: Please see discharge summary for a list of discharge medications.  Relevant Imaging Results:  Relevant Lab Results:   Additional Information SSN  [id number]  Joanne Chars, LCSW

## 2020-04-23 NOTE — Progress Notes (Signed)
PROGRESS NOTE    David [name]  IOE:[id number] DOB: 1940/09/16 DOA: 04/20/2020 PCP: [name], MD  Brief Narrative:79 y.o.malewith medical history significant ofSA node dysfunctions/ppacemaker, CAD, CKD stage IV, HTN, HLD, CVA, IDDM on insulin, OSA on CPAP, and PVD presents with complaints of a shortness of breath. Patient reports that he has had progressively worsening shortness of breath over the last 1 month, but had recently became worse. Pt is followed by Nephrology for CKD, recently having AVF placed. Pt reported increasing cough and sob   Assessment & Plan:   Fluid overload Progressive CKD 4, metabolic acidosis Acute on chronic diastolic CHF -Clinically improving with diuresis, echo with EF of 55% -I/O not recorded yesterday -Nephrology following, recent AV fistula placement on 12/2, kidney function is stable -per Renal, appears to be stabilizing without HD at this time  Left wrist swelling and tenderness -Clinically suspect gout, uric acid -13.4, improving, continue colchicine and prednisone x1 today  Hypertensive urgency -Improving with diuresis, continue amlodipine, Coreg, hydralazine and Imdur  Mild cognitive deficits  Hospital delirium -overall stable, monitor  Anemia of chronic disease -Given Feraheme -Hb stable,  monitor  Type 2 diabetes mellitus with hyperglycemia -Last hemoglobin A1c was 6.5 -Continue sliding scale insulin  DVT prophylaxis: Heparin subcutaneous Code Status: Full code Family Communication: Wife at bedside Disposition Plan:  Status is: Inpatient  Remains inpatient appropriate because:Inpatient level of care appropriate due to severity of illness   Dispo: The patient is from: Home              Anticipated d/c is to: Home              Anticipated d/c date is: 2- 3 days              Patient currently is not medically stable to d/c.  Consultants:   Renal   Procedures:   Antimicrobials:    Subjective: -feels better  overall, wrist improving, breathing better  Objective: Vitals:   04/22/20 2244 04/23/20 0300 04/23/20 1000 04/23/20 1006  BP:  (!) 156/87  (!) 151/48  Pulse: 100 87    Resp: 17 16    Temp:  97.9 F (36.6 C)    TempSrc:  Oral    SpO2: 93% 97%    Weight:   126.8 kg   Height:        Intake/Output Summary (Last 24 hours) at 04/23/2020 1222 Last data filed at 04/23/2020 1015 Gross per 24 hour  Intake 253 ml  Output 650 ml  Net -397 ml   Filed Weights   04/22/20 0551 04/23/20 1000  Weight: 126.8 kg 126.8 kg    Examination:  General exam: Obese pleasant male sitting up in bed, awake alert oriented to self place and partly to time, no distress HEENT: Neck obese unable to assess JVD CVS: S1-S2, regular rate rhythm Lungs: Decreased breath sounds to bases Abdomen: Soft, obese, nontender, bowel sounds present Extremities: Trace edema, left arm AV fistula, left wrist swelling and tenderness improving  Skin: No rashes on exposed skin Psychiatry:  Mood & affect appropriate.     Data Reviewed:   CBC: Recent Labs  Lab 04/20/20 1315 04/20/20 1327 04/21/20 0115 04/22/20 0312 04/23/20 0209  WBC 11.9*  --  11.0* 10.5 10.8*  HGB 9.0* 8.8* 8.2* 8.5* 8.8*  HCT 28.7* 26.0* 25.9* 26.4* 27.2*  MCV 99.7  --  97.0 96.4 95.8  PLT 183  --  170 181 182   Basic Metabolic Panel: Recent  Labs  Lab 04/20/20 1315 04/20/20 1327 04/21/20 0115 04/22/20 0312 04/23/20 0209  NA 139 141 139 138 138  K 4.1 4.0 3.9 3.9 4.0  CL 108  --  106 106 102  CO2 17*  --  20* 20* 19*  GLUCOSE 117*  --  188* 119* 209*  BUN 55*  --  61* 68* 78*  CREATININE 2.90*  --  3.07* 3.20* 3.29*  CALCIUM 9.1  --  8.9 9.0 9.1  PHOS  --   --  4.5  --   --    GFR: Estimated Creatinine Clearance: 26.5 mL/min (A) (by C-G formula based on SCr of 3.29 mg/dL (H)). Liver Function Tests: Recent Labs  Lab 04/21/20 0115 04/22/20 0312  AST  --  16  ALT  --  8  ALKPHOS  --  80  BILITOT  --  1.1  PROT  --  6.0*   ALBUMIN 2.6* 2.6*   No results for input(s): LIPASE, AMYLASE in the last 168 hours. No results for input(s): AMMONIA in the last 168 hours. Coagulation Profile: No results for input(s): INR, PROTIME in the last 168 hours. Cardiac Enzymes: No results for input(s): CKTOTAL, CKMB, CKMBINDEX, TROPONINI in the last 168 hours. BNP (last 3 results) No results for input(s): PROBNP in the last 8760 hours. HbA1C: No results for input(s): HGBA1C in the last 72 hours. CBG: Recent Labs  Lab 04/22/20 1234 04/22/20 1612 04/22/20 2102 04/23/20 0735 04/23/20 1203  GLUCAP 135* 225* 167* 184* 241*   Lipid Profile: No results for input(s): CHOL, HDL, LDLCALC, TRIG, CHOLHDL, LDLDIRECT in the last 72 hours. Thyroid Function Tests: No results for input(s): TSH, T4TOTAL, FREET4, T3FREE, THYROIDAB in the last 72 hours. Anemia Panel: Recent Labs    04/21/20 0115  FERRITIN 542*  TIBC 157*  IRON 24*   Urine analysis:    Component Value Date/Time   COLORURINE STRAW (A) 01/09/2019 1900   APPEARANCEUR CLEAR 01/09/2019 1900   LABSPEC 1.009 01/09/2019 1900   PHURINE 5.0 01/09/2019 1900   GLUCOSEU NEGATIVE 01/09/2019 1900   GLUCOSEU NEGATIVE 03/03/2015 1501   HGBUR NEGATIVE 01/09/2019 1900   BILIRUBINUR NEGATIVE 01/09/2019 1900   KETONESUR NEGATIVE 01/09/2019 1900   PROTEINUR NEGATIVE 01/09/2019 1900   UROBILINOGEN 0.2 03/03/2015 1501   NITRITE NEGATIVE 01/09/2019 1900   LEUKOCYTESUR NEGATIVE 01/09/2019 1900   Sepsis Labs: @LABRCNTIP (procalcitonin:4,lacticidven:4)  ) Recent Results (from the past 240 hour(s))  SARS CORONAVIRUS 2 (TAT 6-24 HRS) Nasopharyngeal Nasopharyngeal Swab     Status: None   Collection Time: 04/14/20  1:52 PM   Specimen: Nasopharyngeal Swab  Result Value Ref Range Status   SARS Coronavirus 2 NEGATIVE NEGATIVE Final    Comment: (NOTE) SARS-CoV-2 target nucleic acids are NOT DETECTED.  The SARS-CoV-2 RNA is generally detectable in upper and lower respiratory  specimens during the acute phase of infection. Negative results do not preclude SARS-CoV-2 infection, do not rule out co-infections with other pathogens, and should not be used as the sole basis for treatment or other patient management decisions. Negative results must be combined with clinical observations, patient history, and epidemiological information. The expected result is Negative.  Fact Sheet for Patients: SugarRoll.be  Fact Sheet for Healthcare Providers: https://www.woods-mathews.com/  This test is not yet approved or cleared by the [place] FDA and  has been authorized for detection and/or diagnosis of SARS-CoV-2 by FDA under an Emergency Use Authorization (EUA). This EUA will remain  in effect (meaning this test can be used) for  the duration of the COVID-19 declaration under Se ction 564(b)(1) of the Act, 21 U.S.C. section 360bbb-3(b)(1), unless the authorization is terminated or revoked sooner.  Performed at [facility], [place] [address], [place], [place] [address]   Resp Panel by RT-PCR (Flu A&B, Covid) Nasopharyngeal Swab     Status: None   Collection Time: 04/20/20  1:10 PM   Specimen: Nasopharyngeal Swab; Nasopharyngeal(NP) swabs in vial transport medium  Result Value Ref Range Status   SARS Coronavirus 2 by RT PCR NEGATIVE NEGATIVE Final    Comment: (NOTE) SARS-CoV-2 target nucleic acids are NOT DETECTED.  The SARS-CoV-2 RNA is generally detectable in upper respiratory specimens during the acute phase of infection. The lowest concentration of SARS-CoV-2 viral copies this assay can detect is 138 copies/mL. A negative result does not preclude SARS-Cov-2 infection and should not be used as the sole basis for treatment or other patient management decisions. A negative result may occur with  improper specimen collection/handling, submission of specimen other than nasopharyngeal swab, presence of viral mutation(s)  within the areas targeted by this assay, and inadequate number of viral copies(<138 copies/mL). A negative result must be combined with clinical observations, patient history, and epidemiological information. The expected result is Negative.  Fact Sheet for Patients:  EntrepreneurPulse.com.au  Fact Sheet for Healthcare Providers:  IncredibleEmployment.be  This test is no t yet approved or cleared by the [place] FDA and  has been authorized for detection and/or diagnosis of SARS-CoV-2 by FDA under an Emergency Use Authorization (EUA). This EUA will remain  in effect (meaning this test can be used) for the duration of the COVID-19 declaration under Section 564(b)(1) of the Act, 21 U.S.C.section 360bbb-3(b)(1), unless the authorization is terminated  or revoked sooner.       Influenza A by PCR NEGATIVE NEGATIVE Final   Influenza B by PCR NEGATIVE NEGATIVE Final    Comment: (NOTE) The Xpert Xpress SARS-CoV-2/FLU/RSV plus assay is intended as an aid in the diagnosis of influenza from Nasopharyngeal swab specimens and should not be used as a sole basis for treatment. Nasal washings and aspirates are unacceptable for Xpert Xpress SARS-CoV-2/FLU/RSV testing.  Fact Sheet for Patients: EntrepreneurPulse.com.au  Fact Sheet for Healthcare Providers: IncredibleEmployment.be  This test is not yet approved or cleared by the [place] FDA and has been authorized for detection and/or diagnosis of SARS-CoV-2 by FDA under an Emergency Use Authorization (EUA). This EUA will remain in effect (meaning this test can be used) for the duration of the COVID-19 declaration under Section 564(b)(1) of the Act, 21 U.S.C. section 360bbb-3(b)(1), unless the authorization is terminated or revoked.  Performed at [facility], [place] [address], [place], [place] [address]   Blood culture (routine x 2)     Status: None  (Preliminary result)   Collection Time: 04/20/20  6:13 PM   Specimen: Site Not Specified; Blood  Result Value Ref Range Status   Specimen Description SITE NOT SPECIFIED  Final   Special Requests   Final    BOTTLES DRAWN AEROBIC AND ANAEROBIC Blood Culture adequate volume   Culture   Final    NO GROWTH 3 DAYS Performed at [facility], [place] [address], [place], [place] [address]    Report Status PENDING  Incomplete  Blood culture (routine x 2)     Status: None (Preliminary result)   Collection Time: 04/20/20  6:17 PM   Specimen: Site Not Specified; Blood  Result Value Ref Range Status  Specimen Description SITE NOT SPECIFIED  Final   Special Requests   Final    BOTTLES DRAWN AEROBIC AND ANAEROBIC Blood Culture adequate volume   Culture   Final    NO GROWTH 3 DAYS Performed at [facility], [address] [place], [place] [address]    Report Status PENDING  Incomplete         Radiology Studies: No results found.      Scheduled Meds: . amLODipine  5 mg Oral Daily  . atorvastatin  10 mg Oral QHS  . calcitRIOL  0.25 mcg Oral Daily  . carvedilol  25 mg Oral BID WC  . cholestyramine light  4 g Oral Daily  . clopidogrel  75 mg Oral Daily  . furosemide  80 mg Intravenous BID  . guaiFENesin  600 mg Oral BID  . heparin  5,000 Units Subcutaneous Q8H  . hydrALAZINE  100 mg Oral BID  . insulin aspart  0-15 Units Subcutaneous TID WC  . isosorbide mononitrate  60 mg Oral Daily  . sertraline  100 mg Oral Q supper  . sodium bicarbonate  1,300 mg Oral TID  . sodium chloride flush  3 mL Intravenous Q12H   Continuous Infusions:   LOS: 3 days    Time spent: 39min  Domenic Polite, MD [facility]  04/23/2020, 12:22 PM

## 2020-04-23 NOTE — Progress Notes (Addendum)
[facility] Progress Note   79 y.o.malewith  CKD52following with Dr. Marval Regal) HTN, DM2 (insulin pump), HOCM, SSS s/p PPM, pAfib, OSA on CPAP, nonobstructive CAD, PVD, h/o CVA, HLDwho presents to [facility] progressively worsening SOB. Torsemide recently increased to 40mg  every other day alternating with 20mg .  Has been dealing with left wrist pain since AVF placement.  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 Marval Regal ([place]) with a baseline Cr ~2.5-3 -if there is no adequate diuresis with medical management then would consider initiating hemodialysis in-house but no indications at the moment. I did discuss this with him and he is okay with dialysis initiation if needed -Continue to monitor daily Cr, Dose meds for GFR<15 -Monitor Daily I/Os, Daily weights  -Maintain MAP>65 for optimal renal perfusion.  -Agree with holding ACE-I, avoid further nephrotoxins including NSAIDS, Morphine. Unless absolutely necessary, avoid CT with contrast and/or MRI with gadolinium. - Let's hold the Lasix 80mg  IV BID and see where he's at; possibly can restart Lasix 20mg  PO BID in 24-48hrs if his renal function stabilizes. - BL weight is ~274-276 lbs. - UOP of around 650.   Hypoxic Respiratory Distress, secondary to volume overload -  On RA and oxygenating well.  - Echo 12/7 showed EF 55-60%, no WMA, GIDD.   Hypertension: -elevated on presentation, likely related to SOB/respiratory distress -resume home anti-HTNs, should also be getting benefit with diuresis  Metabolic Acidosis, secondary to advanced CKD -  nahco3 1300mg  TID - Bicarb 19 today.   Anemia due tochronic kidney disease: -received aranesp 61mcg on 12/1; will give another 1110mcg today. -check iron panel (has received feraheme in Jan 2021) ->   Feraheme 12/7 -transfuse for hgb <7  Diabetes Mellitus Type 2 with Hyperglycemia -management per primary  service, hba1c previously low but not true a1c in the context of anemia  Secondary Hyperparathyroidism -resume home calcitriol, phos 12/7 was 4.5  Access: -s/p LUE BC AVF 04/16/2020 with Dr. Luan Pulling, there was an initial concern for steal syndrome which may be mild, can follow with [facility] outpatient unless pain worsens. Appears to be more wrist/ musculoskelelal than steal. Primary treating for suspected gout. Symptoms improving.  Hand is warm. -LUE precautions  Subjective:   Patient reports feeling well today, denies any new complaints. Reports that his arm pain has been improving. Denies any fevers, chills, nausea, vomiting, chest pain, or SOB.    Objective:   BP (!) 151/48   Pulse 87   Temp 97.9 F (36.6 C) (Oral)   Resp 16   Ht 6\' 4"  (1.93 m)   Wt 126.8 kg   SpO2 97%   BMI 34.03 kg/m   Intake/Output Summary (Last 24 hours) at 04/23/2020 1031 Last data filed at 04/23/2020 1015 Gross per 24 hour  Intake 253 ml  Output 650 ml  Net -397 ml   Weight change:   Physical Exam: GEN: Elderly male, NAD, sitting up in chair HEENT: No conjunctival pallor, EOMI NECK: Supple, no thyromegaly LUNGS: CTA B/L no rales, rhonchi or wheezing CV: RRR, No M/R/G ABD: SNDNT +BS  EXT: tr-1+ lower extremity edema, left wrist warm and tender to touch ACCESS: lt BCF  Imaging: ECHOCARDIOGRAM COMPLETE  Result Date: 04/21/2020    ECHOCARDIOGRAM REPORT   Patient Name:   Phuong CHERON PASQUARELLI Date of Exam: 04/21/2020 Medical Rec #:  [id number]        Height:       76.0 in Accession #:  [id number]       Weight:       274.0 lb Date of Birth:  10-Feb-1941         BSA:          2.532 m Patient Age:    78 years         BP:           161/50 mmHg Patient Gender: M                HR:           79 bpm. Exam Location:  Inpatient Procedure: 2D Echo, Cardiac Doppler, Color Doppler and Intracardiac            Opacification Agent Indications:    CHF-Acute Diastolic 269.48 / N46.27  History:        Patient has prior history of  Echocardiogram examinations, most                 recent 01/07/2019. CHF, CAD, Pacemaker, Arrythmias:Atrial                 Fibrillation, Signs/Symptoms:Dyspnea; Risk Factors:Hypertension,                 Dyslipidemia and Former Smoker. PVD.  Sonographer:    Vickie Epley RDCS Referring Phys: [id number] RONDELL A SMITH IMPRESSIONS  1. Left ventricular ejection fraction, by estimation, is 55 to 60%. The left ventricle has normal function. The left ventricle has no regional wall motion abnormalities. There is moderate concentric left ventricular hypertrophy. Left ventricular diastolic parameters are consistent with Grade II diastolic dysfunction (pseudonormalization). Elevated left atrial pressure.  2. Right ventricular systolic function is normal. The right ventricular size is normal. Mildly increased right ventricular wall thickness. Tricuspid regurgitation signal is inadequate for assessing PA pressure.  3. Left atrial size was mildly dilated.  4. A small pericardial effusion is present. The pericardial effusion is circumferential. There is no evidence of cardiac tamponade.  5. The mitral valve is normal in structure. No evidence of mitral valve regurgitation.  6. 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 There is mild calcification of the aortic valve. There is moderate thickening of the aortic valve. Aortic valve regurgitation is trivial. Mild to moderate aortic valve sclerosis/calcification is present, without any evidence of aortic stenosis.  7. Aortic dilatation noted. There is mild dilatation of the aortic root, measuring 41 mm.  8. The inferior vena cava is dilated in size with >50% respiratory variability, suggesting right atrial pressure of 8 mmHg. FINDINGS  Left Ventricle: Left ventricular ejection fraction, by estimation, is 55 to 60%. The left ventricle has normal function. The left ventricle has no regional wall motion abnormalities. Definity contrast agent was given IV to delineate the left ventricular  endocardial  borders. The left ventricular internal cavity size was normal in size. There is moderate concentric left ventricular hypertrophy. Left ventricular diastolic parameters are consistent with Grade II diastolic dysfunction (pseudonormalization).  Elevated left atrial pressure. Right Ventricle: The right ventricular size is normal. Mildly increased right ventricular wall thickness. Right ventricular systolic function is normal. Tricuspid regurgitation signal is inadequate for assessing PA pressure. Left Atrium: Left atrial size was mildly dilated. Right Atrium: Right atrial size was normal in size. Pericardium: A small pericardial effusion is present. The pericardial effusion is circumferential. There is no evidence of cardiac tamponade. Mitral Valve: The mitral valve is normal in structure. No evidence of mitral valve regurgitation. Tricuspid Valve: The tricuspid valve is normal in structure. Tricuspid  valve regurgitation is not demonstrated. 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 There is mild calcification of the aortic valve. There is moderate thickening of the aortic valve. Aortic valve regurgitation is trivial. Mild to moderate aortic valve sclerosis/calcification is present, without any evidence of aortic stenosis. Aortic valve mean gradient measures 9.0 mmHg. Aortic valve peak gradient measures 14.7 mmHg. Aortic valve area, by VTI measures 3.09 cm. Pulmonic Valve: The pulmonic valve was grossly normal. Pulmonic valve regurgitation is not visualized. Aorta: Aortic dilatation noted. There is mild dilatation of the aortic root, measuring 41 mm. Venous: The inferior vena cava is dilated in size with greater than 50% respiratory variability, suggesting right atrial pressure of 8 mmHg. IAS/Shunts: No atrial level shunt detected by color flow Doppler. Additional Comments: A pacer wire is visualized.  LEFT VENTRICLE PLAX 2D LVIDd:         5.50 cm      Diastology LVIDs:         3.70 cm      LV e' medial:    4.13  cm/s LV PW:         1.50 cm      LV E/e' medial:  19.1 LV IVS:        1.60 cm      LV e' lateral:   5.70 cm/s LVOT diam:     2.40 cm      LV E/e' lateral: 13.8 LV SV:         138 LV SV Index:   54 LVOT Area:     4.52 cm  LV Volumes (MOD) LV vol d, MOD A2C: 229.0 ml LV vol d, MOD A4C: 178.0 ml LV vol s, MOD A2C: 87.8 ml LV vol s, MOD A4C: 56.3 ml LV SV MOD A2C:     141.2 ml LV SV MOD A4C:     178.0 ml LV SV MOD BP:      134.5 ml RIGHT VENTRICLE RV S prime:     12.90 cm/s TAPSE (M-mode): 2.5 cm LEFT ATRIUM             Index       RIGHT ATRIUM           Index LA diam:        4.30 cm 1.70 cm/m  RA Area:     22.80 cm LA Vol (A2C):   69.2 ml 27.33 ml/m RA Volume:   64.40 ml  25.43 ml/m LA Vol (A4C):   64.3 ml 25.39 ml/m LA Biplane Vol: 66.7 ml 26.34 ml/m  AORTIC VALVE AV Area (Vmax):    2.71 cm AV Area (Vmean):   2.55 cm AV Area (VTI):     3.09 cm AV Vmax:           192.00 cm/s AV Vmean:          142.000 cm/s AV VTI:            0.447 m AV Peak Grad:      14.7 mmHg AV Mean Grad:      9.0 mmHg LVOT Vmax:         115.00 cm/s LVOT Vmean:        79.900 cm/s LVOT VTI:          0.305 m LVOT/AV VTI ratio: 0.68  AORTA Ao Root diam: 4.20 cm Ao Asc diam:  3.50 cm MITRAL VALVE MV Area (PHT): 2.87 cm    SHUNTS MV Decel Time: 264 msec  Systemic VTI:  0.30 m MV E velocity: 78.80 cm/s  Systemic Diam: 2.40 cm MV A velocity: 75.40 cm/s MV E/A ratio:  1.05 Mihai Croitoru MD Electronically signed by Sanda Klein MD Signature Date/Time: 04/21/2020/11:12:47 AM    Final     Labs: BMET Recent Labs  Lab 04/20/20 1315 04/20/20 1327 04/21/20 0115 04/22/20 0312 04/23/20 0209  NA 139 141 139 138 138  K 4.1 4.0 3.9 3.9 4.0  CL 108  --  106 106 102  CO2 17*  --  20* 20* 19*  GLUCOSE 117*  --  188* 119* 209*  BUN 55*  --  61* 68* 78*  CREATININE 2.90*  --  3.07* 3.20* 3.29*  CALCIUM 9.1  --  8.9 9.0 9.1  PHOS  --   --  4.5  --   --    CBC Recent Labs  Lab 04/20/20 1315 04/20/20 1327 04/21/20 0115 04/22/20 0312  04/23/20 0209  WBC 11.9*  --  11.0* 10.5 10.8*  HGB 9.0* 8.8* 8.2* 8.5* 8.8*  HCT 28.7* 26.0* 25.9* 26.4* 27.2*  MCV 99.7  --  97.0 96.4 95.8  PLT 183  --  170 181 189    Medications:    . amLODipine  5 mg Oral Daily  . atorvastatin  10 mg Oral QHS  . calcitRIOL  0.25 mcg Oral Daily  . carvedilol  25 mg Oral BID WC  . cholestyramine light  4 g Oral Daily  . clopidogrel  75 mg Oral Daily  . furosemide  80 mg Intravenous BID  . guaiFENesin  600 mg Oral BID  . heparin  5,000 Units Subcutaneous Q8H  . hydrALAZINE  100 mg Oral BID  . insulin aspart  0-15 Units Subcutaneous TID WC  . isosorbide mononitrate  60 mg Oral Daily  . sertraline  100 mg Oral Q supper  . sodium bicarbonate  1,300 mg Oral TID  . sodium chloride flush  3 mL Intravenous Q12H    Asencion Noble, [name] PGY3 04/23/2020 10:34 AM

## 2020-04-23 NOTE — Evaluation (Signed)
Physical Therapy Evaluation Patient Details Name: David [name] MRN: [id number] DOB: 12-Sep-1940 Today's Date: 04/23/2020   History of Present Illness  79 y/o M presenting to ED on 12/6 for SOB, ascites, B LE edema. Pt was previously here on 12/2 and had a L fistula placed and came back to ED on 12/4 for L UE pain but sent home. PMHx: CKD stage IV, SA node dysnfunction s/p pacemaker, CAD, HTN, HLD, CVA, IDDM on insulin, OSA on CPAP, and PVD.  Clinical Impression  Pt with increased cognition and ability to participate in PT today compared to OT session yesterday. Pt with slight impulsiveness with mobility and lack of safety awareness. Mod +2 assist needed for transfers secondary to decreased strength in the LLE secondary to previous CVA and functional mobility endurance. Pt would benefit from usage of a Stedy next session to help with LLE blocking. Skilled therapy would benefit this pt in order to increase functional mobility assistance need, transfers, gait, and LLE strength. Pt would benefit from SNF placement due to decreased mobility and to decrease caregiver burden.     Follow Up Recommendations SNF;Supervision - Intermittent    Equipment Recommendations  Other (comment) (defer to SNF)    Recommendations for Other Services OT consult     Precautions / Restrictions Precautions Precautions: Fall Precaution Comments: h/o falls at home Restrictions Weight Bearing Restrictions: No      Mobility  Bed Mobility Overal bed mobility: Needs Assistance Bed Mobility: Supine to Sit     Supine to sit: Min assist     General bed mobility comments: min assist for trunk elevation to get to EOB    Transfers Overall transfer level: Needs assistance Equipment used: Rolling walker (2 wheeled) Transfers: Sit to/from [facility] Sit to Stand: Min assist;Mod assist;From elevated surface;+2 physical assistance Stand pivot transfers: Mod assist;+2 physical assistance;From  elevated surface       General transfer comment: pt stood up min assist +1 with PT blocking LLE at knee from elevated surface, pt needed cues for safe hand placement; pt then impulsively sat down so another sit to stand was performed, first attempt was failed due to lack of power up, second attempt was successful but was mod assist +2 secondary to LLE trying to slide out; pt mod assist +2 for stand pivot to the R while blocking LLE and for physical assist, pt had lack of eccentric control on decent  Ambulation/Gait             General Gait Details: not attempted  Stairs            Wheelchair Mobility    Modified Rankin (Stroke Patients Only)       Balance Overall balance assessment: Needs assistance Sitting-balance support: Bilateral upper extremity supported;Feet supported Sitting balance-Leahy Scale: Poor       Standing balance-Leahy Scale: Poor Standing balance comment: pt with bilat UE on RW needed to maintain balance with LLE blocked                             Pertinent Vitals/Pain Pain Assessment: No/denies pain    Home Living Family/patient expects to be discharged to:: Private residence Living Arrangements: Spouse/significant other Available Help at Discharge: Family Type of Home: House Home Access: Stairs to enter Entrance Stairs-Rails: Right Entrance Stairs-Number of Steps: 2 Home Layout: One level Home Equipment: Environmental consultant - 2 wheels;Walker - 4 wheels;Shower seat Additional Comments: Lives with spouse. Son  and daughter in law live a few minutes away. Daughter lives in same townhouse complex but has medical limitiations.    Prior Function Level of Independence: Needs assistance   Gait / Transfers Assistance Needed: walker with assist to walk short, functional distances (ie bed to bathroom). +1 assist at baseline. son has been physically assisting with tub shower transfers; walks 10 feet max at home, needs raised surfaces to perform  transfers  ADL's / [place]: some +1 assist with bathing/dressing. has been struggling more recently with bed mobility and functional transfers.   Comments: wife states they are getting him a hospital bed     Hand Dominance   Dominant Hand: Right    Extremity/Trunk Assessment   Upper Extremity Assessment Upper Extremity Assessment: Defer to OT evaluation    Lower Extremity Assessment Lower Extremity Assessment: LLE deficits/detail LLE Deficits / Details: spuse states h/o L knee problems and L hemiparesis    Cervical / Trunk Assessment Cervical / Trunk Assessment: Normal  Communication   Communication: HOH  Cognition Arousal/Alertness: Awake/alert Behavior During Therapy: WFL for tasks assessed/performed Overall Cognitive Status: Impaired/Different from baseline Area of Impairment: Attention;Memory;Following commands;Safety/judgement;Awareness;Problem solving                   Current Attention Level: Sustained Memory: Decreased short-term memory;Decreased recall of precautions Following Commands: Follows one step commands with increased time;Follows one step commands inconsistently Safety/Judgement: Decreased awareness of safety;Decreased awareness of deficits Awareness: Emergent Problem Solving: Difficulty sequencing General Comments: David [name], his wife, states that he has some baseline STM deficits, states that she believes he is a lot better cognitively today; pt slightly impulsive with mobility today      General Comments      Exercises     Assessment/Plan    PT Assessment Patient needs continued PT services  PT Problem List Decreased cognition;Decreased safety awareness;Decreased mobility;Decreased balance;Decreased activity tolerance       PT Treatment Interventions Balance training;Gait training;Patient/family education;Functional mobility training;Therapeutic activities;Therapeutic exercise;Cognitive remediation    PT Goals  (Current goals can be found in the Care Plan section)  Acute Rehab PT Goals Patient Stated Goal: not stated PT Goal Formulation: With patient/family Time For Goal Achievement: 05/07/20 Potential to Achieve Goals: Fair    Frequency Min 3X/week   Barriers to discharge        Co-evaluation               AM-PAC PT "6 Clicks" Mobility  Outcome Measure Help needed turning from your back to your side while in a flat bed without using bedrails?: A Little Help needed moving from lying on your back to sitting on the side of a flat bed without using bedrails?: A Little Help needed moving to and from a bed to a chair (including a wheelchair)?: A Lot Help needed standing up from a chair using your arms (e.g., wheelchair or bedside chair)?: A Lot Help needed to walk in hospital room?: Total Help needed climbing 3-5 steps with a railing? : Total 6 Click Score: 12    End of Session Equipment Utilized During Treatment: Gait belt Activity Tolerance: Patient tolerated treatment well Patient left: in chair;with call bell/phone within reach;with chair alarm set;with family/visitor present Nurse Communication: Mobility status PT Visit Diagnosis: Unsteadiness on feet (R26.81);Difficulty in walking, not elsewhere classified (R26.2);Hemiplegia and hemiparesis Hemiplegia - Right/Left: Left Hemiplegia - dominant/non-dominant: Non-dominant Hemiplegia - caused by: Unspecified    Time: 7867-5449 PT Time Calculation (min) (ACUTE ONLY): 31 min  Charges:   PT Evaluation $PT Eval Moderate Complexity: 1 Mod PT Treatments $Therapeutic Activity: 8-22 mins        Caleb Popp, SPT [id number]  Jerene Yeager 04/23/2020, 10:35 AM

## 2020-04-23 NOTE — TOC Progression Note (Signed)
Transition of Care [facility]) - Progression Note    Patient Details  Name: David [name] MRN: [id number] Date of Birth: 02-21-1941  Transition of [facility] CM/SW Contact  Joanne Chars, LCSW Phone Number: 04/23/2020, 12:50 PM  Clinical Narrative:  CSW spoke with pt regarding SNF recommendation and he deferred to his wife.  CSW spoke with wife by phone who is in favor of moving forward with SNF placement at this time.      Expected Discharge Plan: [place] Barriers to Discharge: Continued Medical Work up  Expected Discharge Plan and Services Expected Discharge Plan: [place]   Discharge Planning Services: CM Consult Post Acute Care Choice: [place] arrangements for the past 2 months: Single Family Home                 DME Arranged: Hospital bed         HH Arranged: PT,OT,Nurse's Aide [place] Agency: [employer]         Social Determinants of Health (SDOH) Interventions    Readmission Risk Interventions No flowsheet data found.

## 2020-04-24 LAB — CBC
HCT: 25.1 % — ABNORMAL LOW (ref 39.0–52.0)
Hemoglobin: 8.6 g/dL — ABNORMAL LOW (ref 13.0–17.0)
MCH: 31.9 pg (ref 26.0–34.0)
MCHC: 34.3 g/dL (ref 30.0–36.0)
MCV: 93 fL (ref 80.0–100.0)
Platelets: 190 10*3/uL (ref 150–400)
RBC: 2.7 MIL/uL — ABNORMAL LOW (ref 4.22–5.81)
RDW: 14.1 % (ref 11.5–15.5)
WBC: 10 10*3/uL (ref 4.0–10.5)
nRBC: 0 % (ref 0.0–0.2)

## 2020-04-24 LAB — BASIC METABOLIC PANEL
Anion gap: 13 (ref 5–15)
BUN: 85 mg/dL — ABNORMAL HIGH (ref 8–23)
CO2: 21 mmol/L — ABNORMAL LOW (ref 22–32)
Calcium: 8.6 mg/dL — ABNORMAL LOW (ref 8.9–10.3)
Chloride: 101 mmol/L (ref 98–111)
Creatinine, Ser: 3.05 mg/dL — ABNORMAL HIGH (ref 0.61–1.24)
GFR, Estimated: 20 mL/min — ABNORMAL LOW (ref >60)
Glucose, Bld: 284 mg/dL — ABNORMAL HIGH (ref 70–99)
Potassium: 4 mmol/L (ref 3.5–5.1)
Sodium: 135 mmol/L (ref 135–145)

## 2020-04-24 LAB — GLUCOSE, CAPILLARY
Glucose-Capillary: 257 mg/dL — ABNORMAL HIGH (ref 70–99)
Glucose-Capillary: 217 mg/dL — ABNORMAL HIGH (ref 70–99)
Glucose-Capillary: 256 mg/dL — ABNORMAL HIGH (ref 70–99)
Glucose-Capillary: 289 mg/dL — ABNORMAL HIGH (ref 70–99)
Glucose-Capillary: 253 mg/dL — ABNORMAL HIGH (ref 70–99)

## 2020-04-24 NOTE — Progress Notes (Signed)
PROGRESS NOTE    David [name]  IOX:[id number] DOB: 02/03/1941 DOA: 04/20/2020 PCP: [name], MD  Brief Narrative:79 y.o.malewith medical history significant ofSA node dysfunctions/ppacemaker, CAD, CKD stage IV, HTN, HLD, CVA, IDDM on insulin, OSA on CPAP, and PVD presents with complaints of a shortness of breath. Patient reports that he has had progressively worsening shortness of breath over the last 1 month, but had recently became worse. Pt is followed by Nephrology for CKD, recently having AVF placed. Pt reported increasing cough and sob   Assessment & Plan:   Fluid overload Progressive CKD 4, metabolic acidosis Acute on chronic diastolic CHF -Clinically improving with diuresis, echo with EF of 55% -negative >2L -Nephrology following, recent AV fistula placement on 12/2, kidney function is stable -per Renal, appears to be stabilizing without HD at this time -Plan to resume oral diuretics tomorrow if stable -Discharge planning, SNF for rehabilitation  Left wrist gout flare -Clinically suspect gout, uric acid -13.4, improved considerably -Treated with 2 doses of colchicine and prednisone  Hypertensive urgency -Improving with diuresis, continue amlodipine, Coreg, hydralazine and Imdur  Mild cognitive deficits  Hospital delirium -overall stable, monitor  Anemia of chronic disease -Given Feraheme -Hb stable,  monitor  Type 2 diabetes mellitus with hyperglycemia -Last hemoglobin A1c was 6.5 -Continue sliding scale insulin  DVT prophylaxis: Heparin subcutaneous Code Status: Full code Family Communication: Wife at bedside Disposition Plan:  Status is: Inpatient  Remains inpatient appropriate because:Inpatient level of care appropriate due to severity of illness   Dispo: The patient is from: Home              Anticipated d/c is to: SNF              Anticipated d/c date is: Likely Sunday              Patient currently is not medically stable to  d/c.  Consultants:   Renal   Procedures:   Antimicrobials:    Subjective: -Overnight some issues with confusion, breathing improving overall, left wrist is much better  Objective: Vitals:   04/23/20 1448 04/23/20 2100 04/24/20 0500 04/24/20 0900  BP: (!) 147/45 (!) 155/50 (!) 134/45   Pulse: (!) 59 (!) 59 66   Resp: 20 17 20  (!) 21  Temp: (!) 97.5 F (36.4 C) 97.9 F (36.6 C) 97.8 F (36.6 C)   TempSrc:  Oral Oral   SpO2: 96% 95% 96%   Weight:   124.4 kg   Height:        Intake/Output Summary (Last 24 hours) at 04/24/2020 1341 Last data filed at 04/24/2020 0500 Gross per 24 hour  Intake 420 ml  Output 1400 ml  Net -980 ml   Filed Weights   04/22/20 0551 04/23/20 1000 04/24/20 0500  Weight: 126.8 kg 126.8 kg 124.4 kg    Examination:  General exam: Obese pleasant male sitting up in bed, awake alert oriented to self and place, mild cognitive deficits HEENT: Neck obese unable to assess JVD CVS: S1-S2, regular rate rhythm Lungs: Decreased breath sounds to bases Abdomen: Soft, obese, nontender, bowel sounds present Extremities: Trace edema, left arm AV fistula, left wrist swelling and tenderness has considerably improved  Skin: No rashes on exposed skin Psychiatry:  Mood & affect appropriate.     Data Reviewed:   CBC: Recent Labs  Lab 04/20/20 1315 04/20/20 1327 04/21/20 0115 04/22/20 0312 04/23/20 0209 04/24/20 0107  WBC 11.9*  --  11.0* 10.5 10.8* 10.0  HGB  9.0* 8.8* 8.2* 8.5* 8.8* 8.6*  HCT 28.7* 26.0* 25.9* 26.4* 27.2* 25.1*  MCV 99.7  --  97.0 96.4 95.8 93.0  PLT 183  --  170 181 189 195   Basic Metabolic Panel: Recent Labs  Lab 04/20/20 1315 04/20/20 1327 04/21/20 0115 04/22/20 0312 04/23/20 0209 04/24/20 0107  NA 139 141 139 138 138 135  K 4.1 4.0 3.9 3.9 4.0 4.0  CL 108  --  106 106 102 101  CO2 17*  --  20* 20* 19* 21*  GLUCOSE 117*  --  188* 119* 209* 284*  BUN 55*  --  61* 68* 78* 85*  CREATININE 2.90*  --  3.07* 3.20* 3.29*  3.05*  CALCIUM 9.1  --  8.9 9.0 9.1 8.6*  PHOS  --   --  4.5  --   --   --    GFR: Estimated Creatinine Clearance: 28.3 mL/min (A) (by C-G formula based on SCr of 3.05 mg/dL (H)). Liver Function Tests: Recent Labs  Lab 04/21/20 0115 04/22/20 0312  AST  --  16  ALT  --  8  ALKPHOS  --  80  BILITOT  --  1.1  PROT  --  6.0*  ALBUMIN 2.6* 2.6*   No results for input(s): LIPASE, AMYLASE in the last 168 hours. No results for input(s): AMMONIA in the last 168 hours. Coagulation Profile: No results for input(s): INR, PROTIME in the last 168 hours. Cardiac Enzymes: No results for input(s): CKTOTAL, CKMB, CKMBINDEX, TROPONINI in the last 168 hours. BNP (last 3 results) No results for input(s): PROBNP in the last 8760 hours. HbA1C: No results for input(s): HGBA1C in the last 72 hours. CBG: Recent Labs  Lab 04/23/20 1203 04/23/20 1616 04/23/20 2138 04/24/20 0808 04/24/20 1144  GLUCAP 241* 253* 257* 217* 256*   Lipid Profile: No results for input(s): CHOL, HDL, LDLCALC, TRIG, CHOLHDL, LDLDIRECT in the last 72 hours. Thyroid Function Tests: No results for input(s): TSH, T4TOTAL, FREET4, T3FREE, THYROIDAB in the last 72 hours. Anemia Panel: No results for input(s): VITAMINB12, FOLATE, FERRITIN, TIBC, IRON, RETICCTPCT in the last 72 hours. Urine analysis:    Component Value Date/Time   COLORURINE STRAW (A) 01/09/2019 1900   APPEARANCEUR CLEAR 01/09/2019 1900   LABSPEC 1.009 01/09/2019 1900   PHURINE 5.0 01/09/2019 1900   GLUCOSEU NEGATIVE 01/09/2019 1900   GLUCOSEU NEGATIVE 03/03/2015 1501   HGBUR NEGATIVE 01/09/2019 1900   BILIRUBINUR NEGATIVE 01/09/2019 1900   KETONESUR NEGATIVE 01/09/2019 1900   PROTEINUR NEGATIVE 01/09/2019 1900   UROBILINOGEN 0.2 03/03/2015 1501   NITRITE NEGATIVE 01/09/2019 1900   LEUKOCYTESUR NEGATIVE 01/09/2019 1900   Sepsis Labs: @LABRCNTIP (procalcitonin:4,lacticidven:4)  ) Recent Results (from the past 240 hour(s))  SARS CORONAVIRUS 2 (TAT  6-24 HRS) Nasopharyngeal Nasopharyngeal Swab     Status: None   Collection Time: 04/14/20  1:52 PM   Specimen: Nasopharyngeal Swab  Result Value Ref Range Status   SARS Coronavirus 2 NEGATIVE NEGATIVE Final    Comment: (NOTE) SARS-CoV-2 target nucleic acids are NOT DETECTED.  The SARS-CoV-2 RNA is generally detectable in upper and lower respiratory specimens during the acute phase of infection. Negative results do not preclude SARS-CoV-2 infection, do not rule out co-infections with other pathogens, and should not be used as the sole basis for treatment or other patient management decisions. Negative results must be combined with clinical observations, patient history, and epidemiological information. The expected result is Negative.  Fact Sheet for Patients: SugarRoll.be  Fact Sheet for  Healthcare Providers: https://www.woods-mathews.com/  This test is not yet approved or cleared by the [place] and  has been authorized for detection and/or diagnosis of SARS-CoV-2 by FDA under an Emergency Use Authorization (EUA). This EUA will remain  in effect (meaning this test can be used) for the duration of the COVID-19 declaration under Se ction 564(b)(1) of the Act, 21 U.S.C. section 360bbb-3(b)(1), unless the authorization is terminated or revoked sooner.  Performed at [facility], [place] [address] [place], [place] [address]   Resp Panel by RT-PCR (Flu A&B, Covid) Nasopharyngeal Swab     Status: None   Collection Time: 04/20/20  1:10 PM   Specimen: Nasopharyngeal Swab; Nasopharyngeal(NP) swabs in vial transport medium  Result Value Ref Range Status   SARS Coronavirus 2 by RT PCR NEGATIVE NEGATIVE Final    Comment: (NOTE) SARS-CoV-2 target nucleic acids are NOT DETECTED.  The SARS-CoV-2 RNA is generally detectable in upper respiratory specimens during the acute phase of infection. The lowest concentration of SARS-CoV-2 viral  copies this assay can detect is 138 copies/mL. A negative result does not preclude SARS-Cov-2 infection and should not be used as the sole basis for treatment or other patient management decisions. A negative result may occur with  improper specimen collection/handling, submission of specimen other than nasopharyngeal swab, presence of viral mutation(s) within the areas targeted by this assay, and inadequate number of viral copies(<138 copies/mL). A negative result must be combined with clinical observations, patient history, and epidemiological information. The expected result is Negative.  Fact Sheet for Patients:  EntrepreneurPulse.com.au  Fact Sheet for Healthcare Providers:  IncredibleEmployment.be  This test is no t yet approved or cleared by the [place] FDA and  has been authorized for detection and/or diagnosis of SARS-CoV-2 by FDA under an Emergency Use Authorization (EUA). This EUA will remain  in effect (meaning this test can be used) for the duration of the COVID-19 declaration under Section 564(b)(1) of the Act, 21 U.S.C.section 360bbb-3(b)(1), unless the authorization is terminated  or revoked sooner.       Influenza A by PCR NEGATIVE NEGATIVE Final   Influenza B by PCR NEGATIVE NEGATIVE Final    Comment: (NOTE) The Xpert Xpress SARS-CoV-2/FLU/RSV plus assay is intended as an aid in the diagnosis of influenza from Nasopharyngeal swab specimens and should not be used as a sole basis for treatment. Nasal washings and aspirates are unacceptable for Xpert Xpress SARS-CoV-2/FLU/RSV testing.  Fact Sheet for Patients: EntrepreneurPulse.com.au  Fact Sheet for Healthcare Providers: IncredibleEmployment.be  This test is not yet approved or cleared by the [place] FDA and has been authorized for detection and/or diagnosis of SARS-CoV-2 by FDA under an Emergency Use Authorization (EUA). This  EUA will remain in effect (meaning this test can be used) for the duration of the COVID-19 declaration under Section 564(b)(1) of the Act, 21 U.S.C. section 360bbb-3(b)(1), unless the authorization is terminated or revoked.  Performed at [facility], [place] [address], [place], [place] [address]   Blood culture (routine x 2)     Status: None (Preliminary result)   Collection Time: 04/20/20  6:13 PM   Specimen: Site Not Specified; Blood  Result Value Ref Range Status   Specimen Description SITE NOT SPECIFIED  Final   Special Requests   Final    BOTTLES DRAWN AEROBIC AND ANAEROBIC Blood Culture adequate volume   Culture   Final    NO GROWTH 4 DAYS Performed at [facility], [address] [place], [place] [address]    Report Status PENDING  Incomplete  Blood culture (routine x 2)     Status: None (Preliminary result)   Collection Time: 04/20/20  6:17 PM   Specimen: Site Not Specified; Blood  Result Value Ref Range Status   Specimen Description SITE NOT SPECIFIED  Final   Special Requests   Final    BOTTLES DRAWN AEROBIC AND ANAEROBIC Blood Culture adequate volume   Culture   Final    NO GROWTH 4 DAYS Performed at [facility], [place] [address] [place], [place] [address]    Report Status PENDING  Incomplete     Scheduled Meds: . amLODipine  5 mg Oral Daily  . atorvastatin  10 mg Oral QHS  . calcitRIOL  0.25 mcg Oral Daily  . carvedilol  25 mg Oral BID WC  . cholestyramine light  4 g Oral Daily  . clopidogrel  75 mg Oral Daily  . guaiFENesin  600 mg Oral BID  . heparin  5,000 Units Subcutaneous Q8H  . hydrALAZINE  100 mg Oral BID  . insulin aspart  0-15 Units Subcutaneous TID WC  . isosorbide mononitrate  60 mg Oral Daily  . sertraline  100 mg Oral Q supper  . sodium bicarbonate  1,300 mg Oral TID  . sodium chloride flush  3 mL Intravenous Q12H   Continuous Infusions:   LOS: 4 days    Time spent: 96min  Domenic Polite, MD [facility]  04/24/2020, 1:41 PM

## 2020-04-24 NOTE — Progress Notes (Signed)
David [name] Progress Note   79 y.o.malewith  CKD32following with Dr. Marval Regal) HTN, DM2 (insulin pump), HOCM, SSS s/p PPM, pAfib, OSA on CPAP, nonobstructive CAD, PVD, h/o CVA, [facility]. Torsemide recently increased to 40mg  every other day alternating with 20mg .  Has been dealing with left wrist pain since AVF placement.  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 Marval Regal ([place]) with a baseline Cr ~2.5-3 -if there is no adequate diuresis with medical management then would consider initiating hemodialysis in-house but no indications at the moment. I did discuss this with him and he is okay with dialysis initiation if needed. -Continue to monitor daily Cr, Dose meds for GFR<15 -Monitor Daily I/Os, Daily weights  -Maintain MAP>65 for optimal renal perfusion.  -Agree with holding ACE-I, avoid further nephrotoxins including NSAIDS, Morphine. Unless absolutely necessary, avoid CT with contrast and/or MRI with gadolinium. -lasix on hold given slight rise in creatinine which is stable/slightly improved. Would favor starting torsemide 20mg  BID to start with, possibly by tomorrow - BL weight is ~274-276 lbs.  Hypoxic Respiratory Distress, secondary to volume overload -  On RA and oxygenating well.  - Echo 12/7 showed EF 55-60%, no WMA, GIDD.   Hypertension: -elevated on presentation, likely related to SOB/respiratory distress. Now stable  Metabolic Acidosis, secondary to advanced CKD -  nahco3 1300mg  TID  Anemia due tochronic kidney disease: -received aranesp 54mcg on 12/1; s/p aranesp 167mcg 12/8 -check iron panel (has received feraheme in Jan 2021) ->   Feraheme 12/7 -transfuse for hgb <7  Diabetes Mellitus Type 2 with Hyperglycemia -management per primary service, hba1c previously low but not true a1c in the context of anemia  Secondary  Hyperparathyroidism -resume home calcitriol, phos 12/7 was 4.5  Access: -s/p LUE BC AVF 04/16/2020 with Dr. Luan Pulling, there was an initial concern for steal syndrome which may be mild, can follow with [facility] outpatient unless pain worsens. Appears to be more wrist/ musculoskelelal than steal. Primary treating for suspected gout. Symptoms improving.  Hand is warm. -LUE precautions  Subjective:   Patient reports feeling well today, been having issues with delirium. Dispo: snf   Objective:   BP (!) 134/45 (BP Location: Right Arm)   Pulse 66   Temp 97.8 F (36.6 C) (Oral)   Resp (!) 21   Ht 6\' 4"  (1.93 m)   Wt 124.4 kg   SpO2 96%   BMI 33.38 kg/m   Intake/Output Summary (Last 24 hours) at 04/24/2020 1312 Last data filed at 04/24/2020 0500 Gross per 24 hour  Intake 420 ml  Output 1400 ml  Net -980 ml   Weight change:   Physical Exam: GEN: Elderly male, NAD, sitting up in bed HEENT: No conjunctival pallor, EOMI NECK: Supple, no thyromegaly LUNGS: CTA B/L no rales, rhonchi or wheezing CV: RRR, No M/R/G ABD: SNDNT +BS  EXT: trace lower extremity edema, left wrist warm and tender to touch ACCESS: lt BCF  Imaging: No results found.  Labs: BMET Recent Labs  Lab 04/20/20 1315 04/20/20 1327 04/21/20 0115 04/22/20 0312 04/23/20 0209 04/24/20 0107  NA 139 141 139 138 138 135  K 4.1 4.0 3.9 3.9 4.0 4.0  CL 108  --  106 106 102 101  CO2 17*  --  20* 20* 19* 21*  GLUCOSE 117*  --  188* 119* 209* 284*  BUN 55*  --  61* 68* 78* 85*  CREATININE 2.90*  --  3.07* 3.20* 3.29* 3.05*  CALCIUM 9.1  --  8.9 9.0 9.1 8.6*  PHOS  --   --  4.5  --   --   --    CBC Recent Labs  Lab 04/21/20 0115 04/22/20 0312 04/23/20 0209 04/24/20 0107  WBC 11.0* 10.5 10.8* 10.0  HGB 8.2* 8.5* 8.8* 8.6*  HCT 25.9* 26.4* 27.2* 25.1*  MCV 97.0 96.4 95.8 93.0  PLT 170 181 189 190    Medications:    . amLODipine  5 mg Oral Daily  . atorvastatin  10 mg Oral QHS  . calcitRIOL  0.25 mcg Oral  Daily  . carvedilol  25 mg Oral BID WC  . cholestyramine light  4 g Oral Daily  . clopidogrel  75 mg Oral Daily  . guaiFENesin  600 mg Oral BID  . heparin  5,000 Units Subcutaneous Q8H  . hydrALAZINE  100 mg Oral BID  . insulin aspart  0-15 Units Subcutaneous TID WC  . isosorbide mononitrate  60 mg Oral Daily  . sertraline  100 mg Oral Q supper  . sodium bicarbonate  1,300 mg Oral TID  . sodium chloride flush  3 mL Intravenous Q12H

## 2020-04-24 NOTE — Progress Notes (Signed)
Inpatient Diabetes Program Recommendations  AACE/ADA: New Consensus Statement on Inpatient Glycemic Control (2015)  Target Ranges:  Prepandial:   less than 140 mg/dL      Peak postprandial:   less than 180 mg/dL (1-2 hours)      Critically ill patients:  140 - 180 mg/dL   Results for MARKIES, MOWATT (MRN [id number]) as of 04/24/2020 12:54  Ref. Range 04/24/2020 08:08 04/24/2020 11:44  Glucose-Capillary Latest Ref Range: 70 - 99 mg/dL 217 (H)  5 units NOVOLOG  256 (H)  8 units NOVOLOG       Home DM Meds: Tresiba 44 units QPM         Novolog 10 units QAM w/ Breakfast and 12 units QPM w/ Dinner         Victoza 1.8 mg QPM    Current Orders: Novolog 0-15 units TID     MD- Note Prednisone stopped (last dose given yest at 10am)  CBGs >200 today  Please consider:  1. Start Lantus 15 units QHS (1/3 total home dose)  2. Start Novolog Meal Coverage: Novolog 3 units TID with meals     --Will follow patient during hospitalization--  Wyn Quaker RN, MSN, CDE Diabetes Coordinator Inpatient Glycemic Control Team Team Pager: [phone] (8a-5p)

## 2020-04-24 NOTE — Progress Notes (Signed)
Pt. Refused cpap. 

## 2020-04-24 NOTE — TOC Progression Note (Addendum)
Transition of Care [facility]) - Progression Note    Patient Details  Name: DELBERT DARLEY MRN: [id number] Date of Birth: 1941-04-18  Transition of Care [facility]) CM/SW Contact  Joanne Chars, LCSW Phone Number: 04/24/2020, 3:07 PM  Clinical Narrative:  CSW called wife and gave her the list of bed offers.  She has the Medicare.gov document with her at home.  She is going to go over the choices with her son tonight and will call back with their choice.  1600: wife calls back and chooses [facility].  Loie at [facility] notified, may be able to admit Sunday if pt is ready.  PASSR is back:   [phone] [id number]    Expected Discharge Plan: [place] Barriers to Discharge: Continued Medical Work up  Expected Discharge Plan and Services Expected Discharge Plan: [place]   Discharge Planning Services: CM Consult Post Acute Care Choice: [place] arrangements for the past 2 months: Single Family Home                 DME Arranged: Hospital bed         HH Arranged: PT,OT,Nurse's Aide [place] Agency: [employer]         Social Determinants of Health (SDOH) Interventions    Readmission Risk Interventions No flowsheet data found.

## 2020-04-25 LAB — CBC
HCT: 28.6 % — ABNORMAL LOW (ref 39.0–52.0)
Hemoglobin: 9.1 g/dL — ABNORMAL LOW (ref 13.0–17.0)
MCH: 30.7 pg (ref 26.0–34.0)
MCHC: 31.8 g/dL (ref 30.0–36.0)
MCV: 96.6 fL (ref 80.0–100.0)
Platelets: 220 10*3/uL (ref 150–400)
RBC: 2.96 MIL/uL — ABNORMAL LOW (ref 4.22–5.81)
RDW: 14.2 % (ref 11.5–15.5)
WBC: 10.4 10*3/uL (ref 4.0–10.5)
nRBC: 0 % (ref 0.0–0.2)

## 2020-04-25 LAB — GLUCOSE, CAPILLARY
Glucose-Capillary: 245 mg/dL — ABNORMAL HIGH (ref 70–99)
Glucose-Capillary: 243 mg/dL — ABNORMAL HIGH (ref 70–99)
Glucose-Capillary: 271 mg/dL — ABNORMAL HIGH (ref 70–99)
Glucose-Capillary: 250 mg/dL — ABNORMAL HIGH (ref 70–99)
Glucose-Capillary: 206 mg/dL — ABNORMAL HIGH (ref 70–99)
Glucose-Capillary: 176 mg/dL — ABNORMAL HIGH (ref 70–99)

## 2020-04-25 LAB — BASIC METABOLIC PANEL
Anion gap: 14 (ref 5–15)
BUN: 86 mg/dL — ABNORMAL HIGH (ref 8–23)
CO2: 23 mmol/L (ref 22–32)
Calcium: 9 mg/dL (ref 8.9–10.3)
Chloride: 103 mmol/L (ref 98–111)
Creatinine, Ser: 2.99 mg/dL — ABNORMAL HIGH (ref 0.61–1.24)
GFR, Estimated: 21 mL/min — ABNORMAL LOW (ref >60)
Glucose, Bld: 252 mg/dL — ABNORMAL HIGH (ref 70–99)
Potassium: 4.1 mmol/L (ref 3.5–5.1)
Sodium: 140 mmol/L (ref 135–145)

## 2020-04-25 LAB — CULTURE, BLOOD (ROUTINE X 2)
Culture: NO GROWTH
Culture: NO GROWTH
Special Requests: ADEQUATE
Special Requests: ADEQUATE

## 2020-04-25 MED ORDER — INSULIN GLARGINE 100 UNIT/ML ~~LOC~~ SOLN
10.0000 [IU] | Freq: Every day | SUBCUTANEOUS | Status: DC
  Administered 2020-04-25 – 2020-04-26 (×2): 10 [IU] via SUBCUTANEOUS
  Filled 2020-04-25 (×2): qty 0.1

## 2020-04-25 NOTE — Plan of Care (Signed)

## 2020-04-25 NOTE — Progress Notes (Signed)
PROGRESS NOTE    David [name]  ZSW:[id number] DOB: 12-09-1940 DOA: 04/20/2020 PCP: [name], MD  Brief Narrative:79 y.o.malewith medical history significant ofSA node dysfunctions/ppacemaker, CAD, CKD stage IV, HTN, HLD, CVA, IDDM on insulin, OSA on CPAP, and PVD presents with complaints of a shortness of breath. Patient reports that he has had progressively worsening shortness of breath over the last 1 month, but had recently became worse. Pt is followed by Nephrology for CKD, recently having AVF placed. Pt reported increasing cough and sob   Assessment & Plan:   Fluid overload Progressive CKD 4, metabolic acidosis Acute on chronic diastolic CHF -Clinically improving with diuresis, echo with EF of 55% -Nephrology following, recent AV fistula placement on 12/2 -Improving, transition to oral torsemide -Appears euvolemic at this time, plan for discharge to SNF tomorrow stable -Social work following for discharge planning  Left wrist gout flare -Clinically suspect gout, uric acid -13.4, improved considerably -Treated with 2 doses of colchicine and prednisone -Resolved  Hypertensive urgency -Improving with diuresis, continue amlodipine, Coreg, hydralazine and Imdur  Mild cognitive deficits  Hospital delirium -overall stable, monitor  Anemia of chronic disease -Given Feraheme -Hb stable,  monitor  Type 2 diabetes mellitus with hyperglycemia -Last hemoglobin A1c was 6.5 -CBGs in the 200s, started low-dose Lantus, continue sliding scale insulin  DVT prophylaxis: Heparin subcutaneous Code Status: Full code Family Communication: Wife at bedside Disposition Plan:  Status is: Inpatient  Remains inpatient appropriate because:Inpatient level of care appropriate due to severity of illness   Dispo: The patient is from: Home              Anticipated d/c is to: SNF              Anticipated d/c date is: Likely Sunday              Patient currently is not medically  stable to d/c.  Consultants:   Renal   Procedures:   Antimicrobials:    Subjective: -Feels okay, no events overnight, intermittent confusion reported per staff  Objective: Vitals:   04/24/20 0900 04/24/20 1611 04/24/20 1935 04/25/20 1004  BP:  (!) 154/46 (!) 175/53 (!) 180/47  Pulse:  (!) 59 62   Resp: (!) 21 19 18    Temp:  98.8 F (37.1 C) 97.8 F (36.6 C) 98 F (36.7 C)  TempSrc:  Oral Oral Oral  SpO2:  99% 94% 96%  Weight:      Height:        Intake/Output Summary (Last 24 hours) at 04/25/2020 1116 Last data filed at 04/25/2020 0206 Gross per 24 hour  Intake --  Output 1100 ml  Net -1100 ml   Filed Weights   04/22/20 0551 04/23/20 1000 04/24/20 0500  Weight: 126.8 kg 126.8 kg 124.4 kg    Examination:  General exam: Obese pleasant male sitting up in bed, awake alert oriented to self place, mild cognitive deficits HEENT: Neck obese unable to assess JVD CVS: S1-S2, regular rate rhythm Lungs: Decreased breath sounds to bases, otherwise clear Abdomen: Obese, soft, nontender bowel sounds present Extremities: Trace edema AVfistula, left wrist swelling and tenderness has resolved Skin: No rashes on exposed skin Psychiatry:  Mood & affect appropriate.     Data Reviewed:   CBC: Recent Labs  Lab 04/21/20 0115 04/22/20 0312 04/23/20 0209 04/24/20 0107 04/25/20 0048  WBC 11.0* 10.5 10.8* 10.0 10.4  HGB 8.2* 8.5* 8.8* 8.6* 9.1*  HCT 25.9* 26.4* 27.2* 25.1* 28.6*  MCV 97.0  96.4 95.8 93.0 96.6  PLT 170 181 189 190 106   Basic Metabolic Panel: Recent Labs  Lab 04/21/20 0115 04/22/20 0312 04/23/20 0209 04/24/20 0107 04/25/20 0048  NA 139 138 138 135 140  K 3.9 3.9 4.0 4.0 4.1  CL 106 106 102 101 103  CO2 20* 20* 19* 21* 23  GLUCOSE 188* 119* 209* 284* 252*  BUN 61* 68* 78* 85* 86*  CREATININE 3.07* 3.20* 3.29* 3.05* 2.99*  CALCIUM 8.9 9.0 9.1 8.6* 9.0  PHOS 4.5  --   --   --   --    GFR: Estimated Creatinine Clearance: 28.8 mL/min (A) (by C-G  formula based on SCr of 2.99 mg/dL (H)). Liver Function Tests: Recent Labs  Lab 04/21/20 0115 04/22/20 0312  AST  --  16  ALT  --  8  ALKPHOS  --  80  BILITOT  --  1.1  PROT  --  6.0*  ALBUMIN 2.6* 2.6*   No results for input(s): LIPASE, AMYLASE in the last 168 hours. No results for input(s): AMMONIA in the last 168 hours. Coagulation Profile: No results for input(s): INR, PROTIME in the last 168 hours. Cardiac Enzymes: No results for input(s): CKTOTAL, CKMB, CKMBINDEX, TROPONINI in the last 168 hours. BNP (last 3 results) No results for input(s): PROBNP in the last 8760 hours. HbA1C: No results for input(s): HGBA1C in the last 72 hours. CBG: Recent Labs  Lab 04/24/20 1144 04/24/20 1610 04/24/20 2145 04/25/20 0714 04/25/20 0914  GLUCAP 256* 289* 253* 245* 243*   Lipid Profile: No results for input(s): CHOL, HDL, LDLCALC, TRIG, CHOLHDL, LDLDIRECT in the last 72 hours. Thyroid Function Tests: No results for input(s): TSH, T4TOTAL, FREET4, T3FREE, THYROIDAB in the last 72 hours. Anemia Panel: No results for input(s): VITAMINB12, FOLATE, FERRITIN, TIBC, IRON, RETICCTPCT in the last 72 hours. Urine analysis:    Component Value Date/Time   COLORURINE STRAW (A) 01/09/2019 1900   APPEARANCEUR CLEAR 01/09/2019 1900   LABSPEC 1.009 01/09/2019 1900   PHURINE 5.0 01/09/2019 1900   GLUCOSEU NEGATIVE 01/09/2019 1900   GLUCOSEU NEGATIVE 03/03/2015 1501   HGBUR NEGATIVE 01/09/2019 1900   BILIRUBINUR NEGATIVE 01/09/2019 1900   KETONESUR NEGATIVE 01/09/2019 1900   PROTEINUR NEGATIVE 01/09/2019 1900   UROBILINOGEN 0.2 03/03/2015 1501   NITRITE NEGATIVE 01/09/2019 1900   LEUKOCYTESUR NEGATIVE 01/09/2019 1900   Sepsis Labs: @LABRCNTIP (procalcitonin:4,lacticidven:4)  ) Recent Results (from the past 240 hour(s))  Resp Panel by RT-PCR (Flu A&B, Covid) Nasopharyngeal Swab     Status: None   Collection Time: 04/20/20  1:10 PM   Specimen: Nasopharyngeal Swab; Nasopharyngeal(NP)  swabs in vial transport medium  Result Value Ref Range Status   SARS Coronavirus 2 by RT PCR NEGATIVE NEGATIVE Final    Comment: (NOTE) SARS-CoV-2 target nucleic acids are NOT DETECTED.  The SARS-CoV-2 RNA is generally detectable in upper respiratory specimens during the acute phase of infection. The lowest concentration of SARS-CoV-2 viral copies this assay can detect is 138 copies/mL. A negative result does not preclude SARS-Cov-2 infection and should not be used as the sole basis for treatment or other patient management decisions. A negative result may occur with  improper specimen collection/handling, submission of specimen other than nasopharyngeal swab, presence of viral mutation(s) within the areas targeted by this assay, and inadequate number of viral copies(<138 copies/mL). A negative result must be combined with clinical observations, patient history, and epidemiological information. The expected result is Negative.  Fact Sheet for Patients:  EntrepreneurPulse.com.au  Fact Sheet for Healthcare Providers:  IncredibleEmployment.be  This test is no t yet approved or cleared by the [place] FDA and  has been authorized for detection and/or diagnosis of SARS-CoV-2 by FDA under an Emergency Use Authorization (EUA). This EUA will remain  in effect (meaning this test can be used) for the duration of the COVID-19 declaration under Section 564(b)(1) of the Act, 21 U.S.C.section 360bbb-3(b)(1), unless the authorization is terminated  or revoked sooner.       Influenza A by PCR NEGATIVE NEGATIVE Final   Influenza B by PCR NEGATIVE NEGATIVE Final    Comment: (NOTE) The Xpert Xpress SARS-CoV-2/FLU/RSV plus assay is intended as an aid in the diagnosis of influenza from Nasopharyngeal swab specimens and should not be used as a sole basis for treatment. Nasal washings and aspirates are unacceptable for Xpert Xpress  SARS-CoV-2/FLU/RSV testing.  Fact Sheet for Patients: EntrepreneurPulse.com.au  Fact Sheet for Healthcare Providers: IncredibleEmployment.be  This test is not yet approved or cleared by the [place] FDA and has been authorized for detection and/or diagnosis of SARS-CoV-2 by FDA under an Emergency Use Authorization (EUA). This EUA will remain in effect (meaning this test can be used) for the duration of the COVID-19 declaration under Section 564(b)(1) of the Act, 21 U.S.C. section 360bbb-3(b)(1), unless the authorization is terminated or revoked.  Performed at [facility], [place] [address], [place], [place] [address]   Blood culture (routine x 2)     Status: None   Collection Time: 04/20/20  6:13 PM   Specimen: Site Not Specified; Blood  Result Value Ref Range Status   Specimen Description SITE NOT SPECIFIED  Final   Special Requests   Final    BOTTLES DRAWN AEROBIC AND ANAEROBIC Blood Culture adequate volume   Culture   Final    NO GROWTH 5 DAYS Performed at [facility], [place] [address], [place], [place] [address]    Report Status 04/25/2020 FINAL  Final  Blood culture (routine x 2)     Status: None   Collection Time: 04/20/20  6:17 PM   Specimen: Site Not Specified; Blood  Result Value Ref Range Status   Specimen Description SITE NOT SPECIFIED  Final   Special Requests   Final    BOTTLES DRAWN AEROBIC AND ANAEROBIC Blood Culture adequate volume   Culture   Final    NO GROWTH 5 DAYS Performed at [facility], [place] [address] [place], [place] [address]    Report Status 04/25/2020 FINAL  Final     Scheduled Meds: . amLODipine  5 mg Oral Daily  . atorvastatin  10 mg Oral QHS  . calcitRIOL  0.25 mcg Oral Daily  . carvedilol  25 mg Oral BID WC  . cholestyramine light  4 g Oral Daily  . clopidogrel  75 mg Oral Daily  . guaiFENesin  600 mg Oral BID  . heparin  5,000 Units Subcutaneous Q8H  . hydrALAZINE  100 mg  Oral BID  . insulin aspart  0-15 Units Subcutaneous TID WC  . insulin glargine  10 Units Subcutaneous Daily  . isosorbide mononitrate  60 mg Oral Daily  . sertraline  100 mg Oral Q supper  . sodium bicarbonate  1,300 mg Oral TID  . sodium chloride flush  3 mL Intravenous Q12H   Continuous Infusions:   LOS: 5 days    Time spent: 6min  Domenic Polite, MD [facility]  04/25/2020, 11:16 AM

## 2020-04-25 NOTE — Progress Notes (Signed)
[facility] Progress Note   79 y.o.malewith  CKD30following with Dr. Marval Regal) HTN, DM2 (insulin pump), HOCM, SSS s/p PPM, pAfib, OSA on CPAP, nonobstructive CAD, PVD, h/o CVA, HLDwho [facility]. Torsemide recently increased to 40mg  every other day alternating with 20mg .  Has been dealing with left wrist pain since AVF placement.  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 Marval Regal ([place]) with a baseline Cr ~2.5-3 -No indication for renal replacement therapy at this junction -Continue to monitor daily Cr, Dose meds for GFR<15 -Monitor Daily I/Os, Daily weights  -Maintain MAP>65 for optimal renal perfusion.  -Agree with holding ACE-I, avoid further nephrotoxins including NSAIDS, Morphine. Unless absolutely necessary, avoid CT with contrast and/or MRI with gadolinium. -lasix on hold.  Recommend starting torsemide 20 mg twice daily on discharge.  His volume status today actually seems very reasonable.  On discharge, can take an extra torsemide 20 mg based on his daily weights and symptoms - BL dry weight is ~274-276 lbs which is where he is at  Hypoxic Respiratory Distress, secondary to volume overload -  On RA and oxygenating well.  - Echo 12/7 showed EF 55-60%, no WMA, GIDD.   Hypertension: -elevated on presentation, likely related to SOB/respiratory distress. Now stable  Metabolic Acidosis, secondary to advanced CKD -  nahco3 1300mg  TID  Anemia due tochronic kidney disease: -received aranesp 58mcg on 12/1; s/p aranesp 122mcg 12/8 -check iron panel (has received feraheme in Jan 2021) ->   Feraheme 12/7 -transfuse for hgb <7  Diabetes Mellitus Type 2 with Hyperglycemia -management per primary service, hba1c previously low but not true a1c in the context of anemia  Secondary Hyperparathyroidism -Continue with calcitriol, phos 12/7 was  4.5  Delirium -Management per primary service  Access: -s/p LUE BC AVF 04/16/2020 with Dr. Luan Pulling, there was an initial concern for steal syndrome which may be mild, can follow with [facility] outpatient unless pain worsens. Appears to be more wrist/ musculoskelelal than steal. Primary treating for suspected gout. Symptoms improving.  Hand is warm. -LUE precautions  Subjective:   Ongoing delirium.  When prompted, patient denies any shortness of breath or worsening swelling.  Redirectable at times.  Possible SNF tomorrow   Objective:   BP (!) 180/47 (BP Location: Right Arm)   Pulse 62   Temp 98 F (36.7 C) (Oral)   Resp 18   Ht 6\' 4"  (1.93 m)   Wt 124.4 kg   SpO2 96%   BMI 33.38 kg/m   Intake/Output Summary (Last 24 hours) at 04/25/2020 1013 Last data filed at 04/25/2020 0206 Gross per 24 hour  Intake --  Output 1100 ml  Net -1100 ml   Weight change:   Physical Exam: GEN: Elderly male, NAD, sitting up in bed HEENT: No conjunctival pallor, EOMI NECK: Supple, no thyromegaly LUNGS: CTA B/L no rales, rhonchi or wheezing CV: RRR, No M/R/G ABD: SNDNT +BS  EXT: trace lower extremity edema, left wrist warm and tender to touch Neuro: Awake, alert, tangential thinking ACCESS: lt BCF  Imaging: No results found.  Labs: BMET Recent Labs  Lab 04/20/20 1315 04/20/20 1327 04/21/20 0115 04/22/20 0312 04/23/20 0209 04/24/20 0107 04/25/20 0048  NA 139 141 139 138 138 135 140  K 4.1 4.0 3.9 3.9 4.0 4.0 4.1  CL 108  --  106 106 102 101 103  CO2 17*  --  20* 20* 19* 21* 23  GLUCOSE 117*  --  188* 119* 209* 284* 252*  BUN 55*  --  61* 68* 78* 85* 86*  CREATININE 2.90*  --  3.07* 3.20* 3.29* 3.05* 2.99*  CALCIUM 9.1  --  8.9 9.0 9.1 8.6* 9.0  PHOS  --   --  4.5  --   --   --   --    CBC Recent Labs  Lab 04/22/20 0312 04/23/20 0209 04/24/20 0107 04/25/20 0048  WBC 10.5 10.8* 10.0 10.4  HGB 8.5* 8.8* 8.6* 9.1*  HCT 26.4* 27.2* 25.1* 28.6*  MCV 96.4 95.8 93.0 96.6  PLT  181 189 190 220    Medications:    . amLODipine  5 mg Oral Daily  . atorvastatin  10 mg Oral QHS  . calcitRIOL  0.25 mcg Oral Daily  . carvedilol  25 mg Oral BID WC  . cholestyramine light  4 g Oral Daily  . clopidogrel  75 mg Oral Daily  . guaiFENesin  600 mg Oral BID  . heparin  5,000 Units Subcutaneous Q8H  . hydrALAZINE  100 mg Oral BID  . insulin aspart  0-15 Units Subcutaneous TID WC  . insulin glargine  10 Units Subcutaneous Daily  . isosorbide mononitrate  60 mg Oral Daily  . sertraline  100 mg Oral Q supper  . sodium bicarbonate  1,300 mg Oral TID  . sodium chloride flush  3 mL Intravenous Q12H

## 2020-04-26 DIAGNOSIS — N2581 Secondary hyperparathyroidism of renal origin: Secondary | ICD-10-CM | POA: Diagnosis not present

## 2020-04-26 DIAGNOSIS — I739 Peripheral vascular disease, unspecified: Secondary | ICD-10-CM | POA: Diagnosis present

## 2020-04-26 DIAGNOSIS — R41841 Cognitive communication deficit: Secondary | ICD-10-CM | POA: Diagnosis present

## 2020-04-26 DIAGNOSIS — E1165 Type 2 diabetes mellitus with hyperglycemia: Secondary | ICD-10-CM | POA: Diagnosis present

## 2020-04-26 DIAGNOSIS — R0602 Shortness of breath: Secondary | ICD-10-CM | POA: Diagnosis not present

## 2020-04-26 DIAGNOSIS — R2681 Unsteadiness on feet: Secondary | ICD-10-CM | POA: Diagnosis present

## 2020-04-26 DIAGNOSIS — R2689 Other abnormalities of gait and mobility: Secondary | ICD-10-CM | POA: Diagnosis present

## 2020-04-26 DIAGNOSIS — J9601 Acute respiratory failure with hypoxia: Secondary | ICD-10-CM | POA: Diagnosis not present

## 2020-04-26 DIAGNOSIS — I959 Hypotension, unspecified: Secondary | ICD-10-CM | POA: Diagnosis not present

## 2020-04-26 DIAGNOSIS — R278 Other lack of coordination: Secondary | ICD-10-CM | POA: Diagnosis present

## 2020-04-26 DIAGNOSIS — M2550 Pain in unspecified joint: Secondary | ICD-10-CM | POA: Diagnosis not present

## 2020-04-26 DIAGNOSIS — E1110 Type 2 diabetes mellitus with ketoacidosis without coma: Secondary | ICD-10-CM | POA: Diagnosis not present

## 2020-04-26 DIAGNOSIS — I129 Hypertensive chronic kidney disease with stage 1 through stage 4 chronic kidney disease, or unspecified chronic kidney disease: Secondary | ICD-10-CM | POA: Diagnosis not present

## 2020-04-26 DIAGNOSIS — I509 Heart failure, unspecified: Secondary | ICD-10-CM | POA: Diagnosis present

## 2020-04-26 DIAGNOSIS — N184 Chronic kidney disease, stage 4 (severe): Secondary | ICD-10-CM | POA: Diagnosis present

## 2020-04-26 DIAGNOSIS — M6281 Muscle weakness (generalized): Secondary | ICD-10-CM | POA: Diagnosis present

## 2020-04-26 DIAGNOSIS — Z8673 Personal history of transient ischemic attack (TIA), and cerebral infarction without residual deficits: Secondary | ICD-10-CM | POA: Diagnosis not present

## 2020-04-26 DIAGNOSIS — I5033 Acute on chronic diastolic (congestive) heart failure: Secondary | ICD-10-CM | POA: Diagnosis present

## 2020-04-26 DIAGNOSIS — F419 Anxiety disorder, unspecified: Secondary | ICD-10-CM | POA: Diagnosis present

## 2020-04-26 DIAGNOSIS — Z7401 Bed confinement status: Secondary | ICD-10-CM | POA: Diagnosis not present

## 2020-04-26 DIAGNOSIS — Z794 Long term (current) use of insulin: Secondary | ICD-10-CM | POA: Diagnosis not present

## 2020-04-26 DIAGNOSIS — E8770 Fluid overload, unspecified: Secondary | ICD-10-CM | POA: Diagnosis present

## 2020-04-26 DIAGNOSIS — D631 Anemia in chronic kidney disease: Secondary | ICD-10-CM | POA: Diagnosis not present

## 2020-04-26 LAB — BASIC METABOLIC PANEL
Anion gap: 12 (ref 5–15)
BUN: 82 mg/dL — ABNORMAL HIGH (ref 8–23)
CO2: 24 mmol/L (ref 22–32)
Calcium: 8.9 mg/dL (ref 8.9–10.3)
Chloride: 105 mmol/L (ref 98–111)
Creatinine, Ser: 2.69 mg/dL — ABNORMAL HIGH (ref 0.61–1.24)
GFR, Estimated: 23 mL/min — ABNORMAL LOW (ref >60)
Glucose, Bld: 207 mg/dL — ABNORMAL HIGH (ref 70–99)
Potassium: 3.7 mmol/L (ref 3.5–5.1)
Sodium: 141 mmol/L (ref 135–145)

## 2020-04-26 LAB — GLUCOSE, CAPILLARY
Glucose-Capillary: 209 mg/dL — ABNORMAL HIGH (ref 70–99)
Glucose-Capillary: 206 mg/dL — ABNORMAL HIGH (ref 70–99)

## 2020-04-26 LAB — SARS CORONAVIRUS 2 BY RT PCR (HOSPITAL ORDER, PERFORMED IN ~~LOC~~ HOSPITAL LAB): SARS Coronavirus 2: NEGATIVE

## 2020-04-26 MED ORDER — TORSEMIDE 20 MG PO TABS
20.0000 mg | ORAL_TABLET | Freq: Two times a day (BID) | ORAL | Status: DC
  Filled 2020-04-26: qty 1

## 2020-04-26 MED ORDER — TORSEMIDE 20 MG PO TABS: 20.0000 mg | ORAL_TABLET | Freq: Two times a day (BID) | ORAL | Status: DC

## 2020-04-26 MED ORDER — TRESIBA FLEXTOUCH 200 UNIT/ML ~~LOC~~ SOPN: 15.0000 [IU] | PEN_INJECTOR | Freq: Every morning | SUBCUTANEOUS | Status: DC

## 2020-04-26 MED ORDER — SODIUM BICARBONATE 650 MG PO TABS: 1300.0000 mg | ORAL_TABLET | Freq: Two times a day (BID) | ORAL | Status: DC

## 2020-04-26 NOTE — TOC Transition Note (Signed)
Transition of Care [facility]) - CM/SW Discharge Note   Patient Details  Name: David [name] MRN: [id number] Date of Birth: 1941/04/10  Transition of Care [facility]) CM/SW Contact:  Coralee Pesa, [place] Phone Number: 04/26/2020, 10:58 AM   Clinical Narrative:     Nurse to call report to [phone] Rm# [phone]  Final next level of care: Skilled Nursing Facility Barriers to Discharge: SNF Pending transportation,Other (comment) (SNF pending negative covid result)   Patient Goals and CMS Choice Patient states their goals for this hospitalization and ongoing recovery are:: to go home CMS Medicare.gov Compare Post Acute Care list provided to:: Other (Comment Required) Choice offered to / list presented to : Spouse  Discharge Placement              Patient chooses bed at:  ([facility]) Patient to be transferred to facility by: [facility] Name of family member notified: Almyra Free Patient and family notified of of transfer: 04/26/20  Discharge Plan and Services   Discharge Planning Services: CM Consult Post Acute Care Choice: [facility]          DME Arranged: Hospital bed         [facility] Arranged: PT,OT,Nurse's Aide [place] Agency: [place]        Social Determinants of Health (SDOH) Interventions     Readmission Risk Interventions No flowsheet data found.

## 2020-04-26 NOTE — Progress Notes (Addendum)
David David [name] Progress Note   79 y.o.malewith  CKD76following with Dr. Marval Regal) HTN, DM2 (insulin pump), HOCM, SSS s/p PPM, pAfib, OSA on CPAP, nonobstructive CAD, PVD, h/o CVA, HLDwho presents to [facility] progressively worsening SOB. Torsemide recently increased to 40mg  every other day alternating with 20mg .  Has been dealing with left wrist pain since AVF placement.  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 Marval Regal ([place]) with a baseline Cr ~2.5-3. His kidney function is at his baseline currently. -No indication for renal replacement therapy at this junction -Continue to monitor daily Cr, Dose meds for GFR<15 -Monitor Daily I/Os, Daily weights  -Maintain MAP>65 for optimal renal perfusion.  -Agree with holding ACE-I, avoid further nephrotoxins including NSAIDS, Morphine. Unless absolutely necessary, avoid CT with contrast and/or MRI with gadolinium. -lasix on hold.  Restarting torsemide 20 mg twice daily today.  After discharge, can take an extra torsemide 20 mg based on his daily weights and symptoms  -has an appt on 06/01/20 with Dr. Marval Regal - BL dry weight is ~274-276 lbs which is where he is at  Hypoxic Respiratory Distress, secondary to volume overload -  On RA and oxygenating well.  - Echo 12/7 showed EF 55-60%, no WMA, GIDD.   Hypertension: -elevated on presentation, likely related to SOB/respiratory distress. Now stable  Metabolic Acidosis, secondary to advanced CKD -  nahco3 1300mg  TID  Anemia due tochronic kidney disease: -received aranesp 73mcg on 12/1; s/p aranesp 177mcg 12/8 -check iron panel (has received feraheme in Jan 2021) ->   Feraheme 12/7 -transfuse for hgb <7  Diabetes Mellitus Type 2 with Hyperglycemia -management per primary service, hba1c previously low but not true a1c in the context of anemia  Secondary Hyperparathyroidism -Continue with calcitriol,  phos 12/7 was 4.5  Delirium -Management per primary service  Access: -s/p LUE BC AVF 04/16/2020 with Dr. Luan Pulling, there was an initial concern for steal syndrome which may be mild, can follow with VVS outpatient unless pain worsens. Appears to be more wrist/ musculoskelelal than steal. Primary treating for suspected gout. Symptoms improved.  Hand is warm. -LUE precautions  Discussed with primary service. Will sign off from a nephrology perspective. Thank you for allowing David David [name] to participate in the care of this patient. Please call with any questions/concerns.  David Quint, MD [facility] David [name]  Subjective:   No acute events, denies SOB. Potential SNF today? No longer has wrist pain   Objective:   BP (!) 145/48 (BP Location: Right Arm)   Pulse 60   Temp 97.8 F (36.6 C) (Oral)   Resp (!) 23   Ht 6\' 4"  (1.93 m)   Wt 124.4 kg   SpO2 97%   BMI 33.38 kg/m   Intake/Output Summary (Last 24 hours) at 04/26/2020 1232 Last data filed at 04/26/2020 0630 Gross per 24 hour  Intake 240 ml  Output 900 ml  Net -660 ml   Weight change:   Physical Exam: GEN: Elderly male, NAD, sitting up in bed HEENT: No conjunctival pallor, EOMI NECK: Supple, no thyromegaly LUNGS: CTA B/L no rales, rhonchi or wheezing CV: RRR, No M/R/G ABD: SNDNT +BS  EXT: trace lower extremity edema, left wrist warm and tender to touch Neuro: Awake, alert, delirious still ACCESS: lt BCF  Imaging: No results found.  Labs: BMET Recent Labs  Lab 04/20/20 1315 04/20/20 1327 04/21/20 0115 04/22/20 1941 04/23/20 7408 04/24/20 0107 04/25/20 0048 04/26/20 0156  NA 139 141 139  138 138 135 140 141  K 4.1 4.0 3.9 3.9 4.0 4.0 4.1 3.7  CL 108  --  106 106 102 101 103 105  CO2 17*  --  20* 20* 19* 21* 23 24  GLUCOSE 117*  --  188* 119* 209* 284* 252* 207*  BUN 55*  --  61* 68* 78* 85* 86* 82*  CREATININE 2.90*  --  3.07* 3.20* 3.29* 3.05* 2.99* 2.69*  CALCIUM 9.1  --  8.9 9.0 9.1 8.6* 9.0 8.9  PHOS   --   --  4.5  --   --   --   --   --    CBC Recent Labs  Lab 04/22/20 0312 04/23/20 0209 04/24/20 0107 04/25/20 0048  WBC 10.5 10.8* 10.0 10.4  HGB 8.5* 8.8* 8.6* 9.1*  HCT 26.4* 27.2* 25.1* 28.6*  MCV 96.4 95.8 93.0 96.6  PLT 181 189 190 220    Medications:    . amLODipine  5 mg Oral Daily  . atorvastatin  10 mg Oral QHS  . calcitRIOL  0.25 mcg Oral Daily  . carvedilol  25 mg Oral BID WC  . cholestyramine light  4 g Oral Daily  . clopidogrel  75 mg Oral Daily  . guaiFENesin  600 mg Oral BID  . heparin  5,000 Units Subcutaneous Q8H  . hydrALAZINE  100 mg Oral BID  . insulin aspart  0-15 Units Subcutaneous TID WC  . insulin glargine  10 Units Subcutaneous Daily  . isosorbide mononitrate  60 mg Oral Daily  . sertraline  100 mg Oral Q supper  . sodium bicarbonate  1,300 mg Oral TID  . sodium chloride flush  3 mL Intravenous Q12H  . torsemide  20 mg Oral BID

## 2020-04-26 NOTE — Discharge Summary (Signed)
Physician Discharge Summary  David [name] UUV:[id number] DOB: March 22, 1941 DOA: 04/20/2020  PCP: [name], MD  Admit date: 04/20/2020 Discharge date: 04/26/2020  Time spent: 35 minutes  Recommendations for Outpatient Follow-up:  1. PCP in 1 week 2. Renal Dr.COlodonato in 2 weeks 3. 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   Hyperlipidemia associated with type 2 diabetes mellitus (HCC)   OSA on CPAP   CKD (chronic kidney disease) stage 4, GFR 15-29 ml/min (HCC)   Fluid overload   Hypertensive urgency   Leukocytosis   History of CVA (cerebrovascular accident)   Discharge Condition: stable  Diet recommendation: low sodium, diabetic  Filed Weights   04/22/20 0551 04/23/20 1000 04/24/20 0500  Weight: 126.8 kg 126.8 kg 124.4 kg    History of present illness:  79 y.o.malewith medical history significant ofSA node dysfunctions/ppacemaker, CAD, CKD stage IV, HTN, HLD, CVA, IDDM on insulin, OSA on CPAP, and PVD presents with complaints of a shortness of breath. Patient reports that he has had progressively worsening shortness of breath over the last 1 month, buthad recently became worse. Pt is followed by Nephrology for CKD, recently having AVF placed. Pt reported increasing cough and sob   Hospital Course:   Fluid overload Progressive CKD 4, metabolic acidosis Acute on chronic diastolic CHF -Clinically improved with diuresis, echo with EF of 55% -Nephrology following, recent AV fistula placement on 12/2 -euvolemic now, transitioned to oral torsemide 15m BID, can take extra 251mfor weight gain/swelling -plan for discharge to SNF today for rehabilitation -FU with Dr.Colodonato in few weeks  Left wrist gout flare -Clinically suspect gout, uric acid -13.4, improved  considerably -Treated with 2 doses of colchicine and prednisone -Resolved  Hypertensive urgency -Improving with diuresis, continue amlodipine, Coreg, hydralazine and Imdur  Mild cognitive deficits  Hospital delirium -did have episodes of delirium this admission, now overall stable  Anemia of chronic disease -Given Feraheme -Hb stable,  monitor  Type 2 diabetes mellitus with hyperglycemia -Last hemoglobin A1c was 6.5 -restarted Tresiba at discharge at a lower dose  Discharge Exam: Vitals:   04/26/20 0603 04/26/20 0900  BP: (!) 184/58 (!) 145/48  Pulse: 60   Resp: 20 (!) 23  Temp: 98.2 F (36.8 C) 97.8 F (36.6 C)  SpO2: 97% 97%    General: AAOx2, mild cognitive deficits Cardiovascular: S1S2/RRR Respiratory: CTAB  Discharge Instructions   Discharge Instructions    Diet - low sodium heart healthy   Complete by: As directed    Diet Carb Modified   Complete by: As directed    Increase activity slowly   Complete by: As directed    No wound care   Complete by: As directed      Allergies as of 04/26/2020      Reactions   Ativan [lorazepam] Anxiety, Other (See Comments)   Pt gets more agitated   Adhesive [tape] Other (See Comments)   blisters      Medication List    TAKE these medications   amLODipine 5 MG tablet Commonly known as: NORVASC TAKE 1 TABLET BY MOUTH EVERY DAY   atorvastatin 10 MG tablet Commonly known as: LIPITOR TAKE 1 TABLET BY MOUTH EVERY DAY AT 6PM What changed:   how much to take  how to take this  when to take this  additional instructions  blood glucose meter kit and supplies Kit Dispense based on patient and insurance preference. Use up to four times daily as directed. (FOR ICD-9 250.00, 250.01).   calcitRIOL 0.25 MCG capsule Commonly known as: ROCALTROL Take 1 capsule by mouth once daily   carvedilol 25 MG tablet Commonly known as: COREG TAKE 1 TABLET BY MOUTH TWICE DAILY WITH A MEAL What changed: See the new  instructions.   cholecalciferol 1000 units tablet Commonly known as: VITAMIN D Take 1 tablet (1,000 Units total) by mouth daily.   cholestyramine light 4 g packet Commonly known as: PREVALITE Take 4 g by mouth daily.   clopidogrel 75 MG tablet Commonly known as: PLAVIX TAKE 1 TABLET BY MOUTH EVERY DAY   Fish Oil 1000 MG Caps Take 1 capsule (1,000 mg total) by mouth every morning.   folic acid 1 MG tablet Commonly known as: FOLVITE Take 1 mg by mouth daily.   glucose blood test strip Use Contour test strips as instructed to check blood sugar four times daily.   hydrALAZINE 100 MG tablet Commonly known as: APRESOLINE TAKE 1 TABLET BY MOUTH TWICE A DAY   INSULIN SYRINGE 1CC/31GX5/16" 31G X 5/16" 1 ML Misc 1 each by Does not apply route 3 (three) times daily. Use insulin syringe to inject insulin three times daily.   isosorbide mononitrate 30 MG 24 hr tablet Commonly known as: IMDUR TAKE 2 TABLETS DAILY. PLEASE MAKE OVERDUE APPT WITH DR. Caryl Comes BEFORE ANYMORE REFILLS. 1ST ATTEMPT What changed: See the new instructions.   NovoLOG FlexPen 100 UNIT/ML FlexPen Generic drug: insulin aspart INJECT 10 UNITS SUBCUTANEOUSLY BEFORE THE FIRST MEAL OF THE DAY AND 12 UNITS AT DINNER What changed:   how much to take  how to take this  when to take this  additional instructions   pantoprazole 40 MG tablet Commonly known as: PROTONIX TAKE 1 TABLET BY MOUTH EVERY DAY   PRESCRIPTION MEDICATION Place into the left eye See admin instructions. Injections at [facility] once every 4 weeks - left eye for diabetic retinopathy   sertraline 100 MG tablet Commonly known as: ZOLOFT TAKE 1 TABLET BY MOUTH EVERY DAY What changed: when to take this   sodium bicarbonate 650 MG tablet Take 2 tablets (1,300 mg total) by mouth 2 (two) times daily.   torsemide 20 MG tablet Commonly known as: DEMADEX Take 1 tablet (20 mg total) by mouth 2 (two) times daily. Can take extra 75m daily for  weight gain or increased swelling What changed:   how much to take  how to take this  when to take this  additional instructions   traMADol 50 MG tablet Commonly known as: ULTRAM Take 1 tablet (50 mg total) by mouth every 12 (twelve) hours as needed for moderate pain.   TTyler AasFlexTouch 200 UNIT/ML FlexTouch Pen Generic drug: insulin degludec Inject 16 Units into the skin every morning. What changed: See the new instructions.   Tylenol 8 Hour Arthritis Pain 650 MG CR tablet Generic drug: acetaminophen Take 650 mg by mouth every 8 (eight) hours as needed for pain.   Victoza 18 MG/3ML Sopn Generic drug: liraglutide INJECT 0.3ML (1.8MG TOTAL) INTO THE SKIN DAILY BEFORE SUPPER What changed: See the new instructions.   vitamin B-12 100 MCG tablet Commonly known as: CYANOCOBALAMIN Take 100 mcg by mouth daily.            Durable Medical Equipment  (From admission, onward)         Start  Ordered   04/21/20 1700  For home use only DME Hospital bed  Once       Question Answer Comment  Length of Need Lifetime   Patient has (list medical condition): OSA, CHF, CKD   The above medical condition requires: Patient requires the ability to reposition frequently   Head must be elevated greater than: 30 degrees   Bed type Semi-electric   Support Surface: Gel Overlay      04/21/20 1701         Allergies  Allergen Reactions  . Ativan [Lorazepam] Anxiety and Other (See Comments)    Pt gets more agitated  . Adhesive [Tape] Other (See Comments)    blisters      The results of significant diagnostics from this hospitalization (including imaging, microbiology, ancillary and laboratory) are listed below for reference.    Significant Diagnostic Studies: DG Wrist Complete Left  Result Date: 04/20/2020 CLINICAL DATA:  Wrist pain procedure for fistula on LEFT arm with pain in LEFT wrist EXAM: LEFT WRIST - COMPLETE 3+ VIEW COMPARISON:  None FINDINGS: Extensive vascular  calcifications project over the soft tissues of the LEFT wrist. Extensive radiocarpal joint space narrowing. Mild carpometacarpal degenerative changes of the first carpometacarpal joint. No sign of acute fracture or bone abnormality. IMPRESSION: 1. No acute fracture or dislocation. 2. Degenerative changes about the wrist. 3. Extensive vascular calcifications in the soft tissues of the LEFT wrist. Electronically Signed   By: Zetta Bills [name]   On: 04/20/2020 14:43   DG Chest Port 1 View  Result Date: 04/20/2020 CLINICAL DATA:  Dyspnea, shortness of breath history of lower extremity edema, CHF and hypertension EXAM: PORTABLE CHEST 1 VIEW COMPARISON:  January 10, 2019 FINDINGS: LEFT-sided power pack for dual lead pacer device projects over LEFT chest. Trachea midline. Cardiomediastinal contours and hilar structures are similar to the prior exam with central pulmonary vascular engorgement. Partially obscured LEFT hemidiaphragm. Graded opacity at RIGHT lung base. On limited assessment no acute skeletal process. IMPRESSION: LEFT lower lobe airspace disease, atelectasis or developing infection. 1. Potential small bilateral pleural effusions. Electronically Signed   By: Zetta Bills [name]   On: 04/20/2020 13:53   VAS [name] UPPER EXTREMITY ARTERIAL DUPLEX  Result Date: 04/07/2020 UPPER EXTREMITY DUPLEX STUDY Indications: Pre-operative exm.  Performing Technologist: Alvia Grove RVT  Examination Guidelines: A complete evaluation includes B-mode imaging, spectral Doppler, color Doppler, and power Doppler as needed of all accessible portions of each vessel. Bilateral testing is considered an integral part of a complete examination. Limited examinations for reoccurring indications may be performed as noted.  Right Pre-Dialysis Findings: +-----------------------+----------+--------------------+---------+--------+ Location               PSV (cm/s)Intralum. Diam. (cm)Waveform Comments  +-----------------------+----------+--------------------+---------+--------+ Brachial Antecub. fossa108       0.46                triphasic         +-----------------------+----------+--------------------+---------+--------+ Radial Art at Wrist    143       0.26                triphasic         +-----------------------+----------+--------------------+---------+--------+ Ulnar Art at Wrist     91        0.21                triphasic         +-----------------------+----------+--------------------+---------+--------+  Left Pre-Dialysis Findings: +-----------------------+----------+--------------------+---------+--------+ Location  PSV (cm/s)Intralum. Diam. (cm)Waveform Comments +-----------------------+----------+--------------------+---------+--------+ Brachial Antecub. fossa109       0.45                triphasic         +-----------------------+----------+--------------------+---------+--------+ Radial Art at Wrist    77        0.21                triphasic         +-----------------------+----------+--------------------+---------+--------+ Ulnar Art at Wrist     74        0.21                triphasic         +-----------------------+----------+--------------------+---------+--------+  Summary:   Measurements above. *See table(s) above for measurements and observations. Electronically signed by Jamelle Haring on 04/07/2020 at 3:37:43 PM.    Final    ECHOCARDIOGRAM COMPLETE  Result Date: 04/21/2020    ECHOCARDIOGRAM REPORT   Patient Name:   David [name] Date of Exam: 04/21/2020 Medical Rec #:  [id number]        Height:       76.0 in Accession #:    [id number]       Weight:       274.0 lb Date of Birth:  1940-05-22         BSA:          2.532 m Patient Age:    65 years         BP:           161/50 mmHg Patient Gender: M                HR:           79 bpm. Exam Location:  Inpatient Procedure: 2D Echo, Cardiac Doppler, Color Doppler and Intracardiac             Opacification Agent Indications:    CHF-Acute Diastolic 254.98 / Y64.15  History:        Patient has prior history of Echocardiogram examinations, most                 recent 01/07/2019. CHF, CAD, Pacemaker, Arrythmias:Atrial                 Fibrillation, Signs/Symptoms:Dyspnea; Risk Factors:Hypertension,                 Dyslipidemia and Former Smoker. PVD.  Sonographer:    Vickie Epley RDCS Referring Phys: [id number] RONDELL A SMITH IMPRESSIONS  1. Left ventricular ejection fraction, by estimation, is 55 to 60%. The left ventricle has normal function. The left ventricle has no regional wall motion abnormalities. There is moderate concentric left ventricular hypertrophy. Left ventricular diastolic parameters are consistent with Grade II diastolic dysfunction (pseudonormalization). Elevated left atrial pressure.  2. Right ventricular systolic function is normal. The right ventricular size is normal. Mildly increased right ventricular wall thickness. Tricuspid regurgitation signal is inadequate for assessing PA pressure.  3. Left atrial size was mildly dilated.  4. A small pericardial effusion is present. The pericardial effusion is circumferential. There is no evidence of cardiac tamponade.  5. The mitral valve is normal in structure. No evidence of mitral valve regurgitation.  6. 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 There is mild calcification of the aortic valve. There is moderate thickening of the aortic valve. Aortic valve regurgitation is trivial. Mild to moderate aortic valve sclerosis/calcification is present, without any evidence of aortic stenosis.  7. Aortic dilatation noted. There is mild dilatation of the aortic root, measuring 41 mm.  8. The inferior vena cava is dilated in size with >50% respiratory variability, suggesting right atrial pressure of 8 mmHg. FINDINGS  Left Ventricle: Left ventricular ejection fraction, by estimation, is 55 to 60%. The left ventricle has normal function. The left ventricle has no  regional wall motion abnormalities. Definity contrast agent was given IV to delineate the left ventricular  endocardial borders. The left ventricular internal cavity size was normal in size. There is moderate concentric left ventricular hypertrophy. Left ventricular diastolic parameters are consistent with Grade II diastolic dysfunction (pseudonormalization).  Elevated left atrial pressure. Right Ventricle: The right ventricular size is normal. Mildly increased right ventricular wall thickness. Right ventricular systolic function is normal. Tricuspid regurgitation signal is inadequate for assessing PA pressure. Left Atrium: Left atrial size was mildly dilated. Right Atrium: Right atrial size was normal in size. Pericardium: A small pericardial effusion is present. The pericardial effusion is circumferential. There is no evidence of cardiac tamponade. Mitral Valve: The mitral valve is normal in structure. No evidence of mitral valve regurgitation. Tricuspid Valve: The tricuspid valve is normal in structure. Tricuspid valve regurgitation is not demonstrated. 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 There is mild calcification of the aortic valve. There is moderate thickening of the aortic valve. Aortic valve regurgitation is trivial. Mild to moderate aortic valve sclerosis/calcification is present, without any evidence of aortic stenosis. Aortic valve mean gradient measures 9.0 mmHg. Aortic valve peak gradient measures 14.7 mmHg. Aortic valve area, by VTI measures 3.09 cm. Pulmonic Valve: The pulmonic valve was grossly normal. Pulmonic valve regurgitation is not visualized. Aorta: Aortic dilatation noted. There is mild dilatation of the aortic root, measuring 41 mm. Venous: The inferior vena cava is dilated in size with greater than 50% respiratory variability, suggesting right atrial pressure of 8 mmHg. IAS/Shunts: No atrial level shunt detected by color flow Doppler. Additional Comments: A pacer wire is  visualized.  LEFT VENTRICLE PLAX 2D LVIDd:         5.50 cm      Diastology LVIDs:         3.70 cm      LV e' medial:    4.13 cm/s LV PW:         1.50 cm      LV E/e' medial:  19.1 LV IVS:        1.60 cm      LV e' lateral:   5.70 cm/s LVOT diam:     2.40 cm      LV E/e' lateral: 13.8 LV SV:         138 LV SV Index:   54 LVOT Area:     4.52 cm  LV Volumes (MOD) LV vol d, MOD A2C: 229.0 ml LV vol d, MOD A4C: 178.0 ml LV vol s, MOD A2C: 87.8 ml LV vol s, MOD A4C: 56.3 ml LV SV MOD A2C:     141.2 ml LV SV MOD A4C:     178.0 ml LV SV MOD BP:      134.5 ml RIGHT VENTRICLE RV S prime:     12.90 cm/s TAPSE (M-mode): 2.5 cm LEFT ATRIUM             Index       RIGHT ATRIUM           Index LA diam:        4.30 cm 1.70 cm/m  RA Area:     22.80 cm LA Vol (A2C):   69.2 ml 27.33 ml/m RA Volume:   64.40 ml  25.43 ml/m LA Vol (A4C):   64.3 ml 25.39 ml/m LA Biplane Vol: 66.7 ml 26.34 ml/m  AORTIC VALVE AV Area (Vmax):    2.71 cm AV Area (Vmean):   2.55 cm AV Area (VTI):     3.09 cm AV Vmax:           192.00 cm/s AV Vmean:          142.000 cm/s AV VTI:            0.447 m AV Peak Grad:      14.7 mmHg AV Mean Grad:      9.0 mmHg LVOT Vmax:         115.00 cm/s LVOT Vmean:        79.900 cm/s LVOT VTI:          0.305 m LVOT/AV VTI ratio: 0.68  AORTA Ao Root diam: 4.20 cm Ao Asc diam:  3.50 cm MITRAL VALVE MV Area (PHT): 2.87 cm    SHUNTS MV Decel Time: 264 msec    Systemic VTI:  0.30 m MV E velocity: 78.80 cm/s  Systemic Diam: 2.40 cm MV A velocity: 75.40 cm/s MV E/A ratio:  1.05 Mihai Croitoru MD Electronically signed by Sanda Klein MD Signature Date/Time: 04/21/2020/11:12:47 AM    Final    [name] LIMITED JOINT SPACE STRUCTURES LOW LEFT(NO LINKED CHARGES)  Result Date: 04/15/2020 [place] presents to clinic today for Orthovisc injection left knee 4/4  He is experiencing improvement with less pain and increased mobility with a combination of Orthovisc and physical therapy.  He would like to receive his fourth injection today.   Procedure: Real-time Ultrasound Guided Injection of left knee superior lateral patellar space Device: Philips Affiniti 50G Images permanently stored and available for review in PACS Verbal informed consent obtained. Discussed risks and benefits of procedure. Warned about infection bleeding damage to structures skin hypopigmentation and fat atrophy among others. Patient expresses understanding and agreement Time-out conducted.  Noted no overlying erythema, induration, or other signs of local infection.  Skin prepped in a sterile fashion.  Local anesthesia: Topical Ethyl chloride.  With sterile technique and under real time ultrasound guidance: Orthovisc injected into knee. Fluid seen entering the joint capsule.  Completed without difficulty  Advised to call if fevers/chills, erythema, induration, drainage, or persistent bleeding.  Images permanently stored and available for review in the ultrasound unit. Impression: Technically successful ultrasound guided injection.    Lot #[id number]  Return as needed for knee pain.  Discussed that genicular nerve ablation would be next step and they can certainly ask for referral to that if needed in the future.  [name] LIMITED JOINT SPACE STRUCTURES LOW LEFT(NO LINKED CHARGES)  Result Date: 04/15/2020 Sacha presents to clinic today for left knee Orthovisc injection 3/4.  He does not think it helped very much.  As noted earlier he has poor health and is in the process of transitioning to dialysis.  He does not think that he is a very good surgical candidate.  Procedure: Real-time Ultrasound Guided Injection of knee superior lateral patellar space Device: Philips Affiniti 50G Images permanently stored and available for review in PACS Verbal informed consent obtained. Discussed risks and benefits of procedure. Warned about infection bleeding damage to structures skin hypopigmentation and fat atrophy among others. Patient expresses understanding and agreement Time-out  conducted.  Noted no overlying erythema,  induration, or other signs of local infection.  Skin prepped in a sterile fashion.  Local anesthesia: Topical Ethyl chloride.  With sterile technique and under real time ultrasound guidance: Orthovisc injected into knee joint. Fluid seen entering the joint capsule.  Completed without difficulty  Advised to call if fevers/chills, erythema, induration, drainage, or persistent bleeding.  Images permanently stored and available for review in the ultrasound unit. Impression: Technically successful ultrasound guided injection.  Had discussion with patient regarding future plans.  If he does feel much benefit with 3 Orthovisc injections we may want to consider not doing the fourth.  Discussed next steps.  Since he is not a good surgical candidate would consider genicular nerve ablation.  Discussed the procedure.  Patient is scheduled on 29 November for his fourth injection.  Recommend that he keep that appointment and will make a decision on the day about proceeding with fourth injection.   Lot #[id number]   VAS [name] UPPER EXT VEIN MAPPING (PRE-OP AVF)  Result Date: 04/07/2020 UPPER EXTREMITY VEIN MAPPING  Indications: Pre-access. Performing Technologist: Alvia Grove RVT  Examination Guidelines: A complete evaluation includes B-mode imaging, spectral Doppler, color Doppler, and power Doppler as needed of all accessible portions of each vessel. Bilateral testing is considered an integral part of a complete examination. Limited examinations for reoccurring indications may be performed as noted. +-----------------+-------------+----------+---------+ Right Cephalic   Diameter (cm)Depth (cm)Findings  +-----------------+-------------+----------+---------+ Shoulder             0.40                         +-----------------+-------------+----------+---------+ Prox upper arm       0.46               branching +-----------------+-------------+----------+---------+  Mid upper arm        0.41                         +-----------------+-------------+----------+---------+ Dist upper arm    0.27 / 0.39                     +-----------------+-------------+----------+---------+ Antecubital fossa 0.47 / 0.65                     +-----------------+-------------+----------+---------+ Prox forearm         0.35                         +-----------------+-------------+----------+---------+ Mid forearm       0.41 / 0.26                     +-----------------+-------------+----------+---------+ Wrist                0.27                         +-----------------+-------------+----------+---------+ +-----------------+-------------+----------+--------+ Right Basilic    Diameter (cm)Depth (cm)Findings +-----------------+-------------+----------+--------+ Prox upper arm     0.52 / 55                     +-----------------+-------------+----------+--------+ Mid upper arm        0.37                joins   +-----------------+-------------+----------+--------+ Dist upper arm       0.35  joins   +-----------------+-------------+----------+--------+ Antecubital fossa    0.22                        +-----------------+-------------+----------+--------+ Prox forearm         0.17                        +-----------------+-------------+----------+--------+ +-----------------+-------------+----------+---------+ Left Cephalic    Diameter (cm)Depth (cm)Findings  +-----------------+-------------+----------+---------+ Shoulder             0.43                ccurves  +-----------------+-------------+----------+---------+ Prox upper arm    0.33 / 0.42            curves   +-----------------+-------------+----------+---------+ Mid upper arm        0.52                         +-----------------+-------------+----------+---------+ Dist upper arm       0.53                          +-----------------+-------------+----------+---------+ Antecubital fossa    0.49                cirves   +-----------------+-------------+----------+---------+ Prox forearm      0.36 / 0.56           branching +-----------------+-------------+----------+---------+ Mid forearm          0.38                         +-----------------+-------------+----------+---------+ Dist forearm         0.40                         +-----------------+-------------+----------+---------+ +-----------------+-------------+----------+---------+ Left Basilic     Diameter (cm)Depth (cm)Findings  +-----------------+-------------+----------+---------+ Prox upper arm    0.75 / 0.57                     +-----------------+-------------+----------+---------+ Mid upper arm     0.48 / 0.40           branching +-----------------+-------------+----------+---------+ Dist upper arm    0.36 / 0.52                     +-----------------+-------------+----------+---------+ Antecubital fossa    0.40                         +-----------------+-------------+----------+---------+ Prox forearm         0.25                         +-----------------+-------------+----------+---------+ Summary:   Measurements above. *See table(s) above for measurements and observations.  Diagnosing physician: Jamelle Haring Electronically signed by Jamelle Haring on 04/07/2020 at 1:38:23 PM.    Final     Microbiology: Recent Results (from the past 240 hour(s))  Resp Panel by RT-PCR (Flu A&B, Covid) Nasopharyngeal Swab     Status: None   Collection Time: 04/20/20  1:10 PM   Specimen: Nasopharyngeal Swab; Nasopharyngeal(NP) swabs in vial transport medium  Result Value Ref Range Status   SARS Coronavirus 2 by RT PCR NEGATIVE NEGATIVE Final    Comment: (NOTE) SARS-CoV-2 target nucleic acids are NOT DETECTED.  The SARS-CoV-2 RNA  is generally detectable in upper respiratory specimens during the acute phase of infection.  The lowest concentration of SARS-CoV-2 viral copies this assay can detect is 138 copies/mL. A negative result does not preclude SARS-Cov-2 infection and should not be used as the sole basis for treatment or other patient management decisions. A negative result may occur with  improper specimen collection/handling, submission of specimen other than nasopharyngeal swab, presence of viral mutation(s) within the areas targeted by this assay, and inadequate number of viral copies(<138 copies/mL). A negative result must be combined with clinical observations, patient history, and epidemiological information. The expected result is Negative.  Fact Sheet for Patients:  EntrepreneurPulse.com.au  Fact Sheet for Healthcare Providers:  IncredibleEmployment.be  This test is no t yet approved or cleared by the [place] FDA and  has been authorized for detection and/or diagnosis of SARS-CoV-2 by FDA under an Emergency Use Authorization (EUA). This EUA will remain  in effect (meaning this test can be used) for the duration of the COVID-19 declaration under Section 564(b)(1) of the Act, 21 U.S.C.section 360bbb-3(b)(1), unless the authorization is terminated  or revoked sooner.       Influenza A by PCR NEGATIVE NEGATIVE Final   Influenza B by PCR NEGATIVE NEGATIVE Final    Comment: (NOTE) The Xpert Xpress SARS-CoV-2/FLU/RSV plus assay is intended as an aid in the diagnosis of influenza from Nasopharyngeal swab specimens and should not be used as a sole basis for treatment. Nasal washings and aspirates are unacceptable for Xpert Xpress SARS-CoV-2/FLU/RSV testing.  Fact Sheet for Patients: EntrepreneurPulse.com.au  Fact Sheet for Healthcare Providers: IncredibleEmployment.be  This test is not yet approved or cleared by the [place] FDA and has been authorized for detection and/or diagnosis of SARS-CoV-2 by FDA  under an Emergency Use Authorization (EUA). This EUA will remain in effect (meaning this test can be used) for the duration of the COVID-19 declaration under Section 564(b)(1) of the Act, 21 U.S.C. section 360bbb-3(b)(1), unless the authorization is terminated or revoked.  Performed at [facility], [place] [address] [place], [place] [address]   Blood culture (routine x 2)     Status: None   Collection Time: 04/20/20  6:13 PM   Specimen: Site Not Specified; Blood  Result Value Ref Range Status   Specimen Description SITE NOT SPECIFIED  Final   Special Requests   Final    BOTTLES DRAWN AEROBIC AND ANAEROBIC Blood Culture adequate volume   Culture   Final    NO GROWTH 5 DAYS Performed at [facility], [place] [address], [place], [place] [address]    Report Status 04/25/2020 FINAL  Final  Blood culture (routine x 2)     Status: None   Collection Time: 04/20/20  6:17 PM   Specimen: Site Not Specified; Blood  Result Value Ref Range Status   Specimen Description SITE NOT SPECIFIED  Final   Special Requests   Final    BOTTLES DRAWN AEROBIC AND ANAEROBIC Blood Culture adequate volume   Culture   Final    NO GROWTH 5 DAYS Performed at [facility], [place] [address], [place],  [address]    Report Status 04/25/2020 FINAL  Final     Labs: Basic Metabolic Panel: Recent Labs  Lab 04/21/20 0115 04/22/20 0312 04/23/20 0209 04/24/20 0107 04/25/20 0048 04/26/20 0156  NA 139 138 138 135 140 141  K 3.9 3.9 4.0 4.0 4.1 3.7  CL 106 106 102 101 103 105  CO2 20* 20* 19* 21* 23 24  GLUCOSE 188* 119* 209* 284* 252* 207*  BUN 61* 68* 78* 85* 86* 82*  CREATININE 3.07* 3.20* 3.29* 3.05* 2.99* 2.69*  CALCIUM 8.9 9.0 9.1 8.6* 9.0 8.9  PHOS 4.5  --   --   --   --   --    Liver Function Tests: Recent Labs  Lab 04/21/20 0115 04/22/20 0312  AST  --  16  ALT  --  8  ALKPHOS  --  80  BILITOT  --  1.1  PROT  --  6.0*  ALBUMIN 2.6* 2.6*   No results for input(s): LIPASE,  AMYLASE in the last 168 hours. No results for input(s): AMMONIA in the last 168 hours. CBC: Recent Labs  Lab 04/21/20 0115 04/22/20 0312 04/23/20 0209 04/24/20 0107 04/25/20 0048  WBC 11.0* 10.5 10.8* 10.0 10.4  HGB 8.2* 8.5* 8.8* 8.6* 9.1*  HCT 25.9* 26.4* 27.2* 25.1* 28.6*  MCV 97.0 96.4 95.8 93.0 96.6  PLT 170 181 189 190 220   Cardiac Enzymes: No results for input(s): CKTOTAL, CKMB, CKMBINDEX, TROPONINI in the last 168 hours. BNP: BNP (last 3 results) Recent Labs    04/20/20 1315  BNP 1,198.8*    ProBNP (last 3 results) No results for input(s): PROBNP in the last 8760 hours.  CBG: Recent Labs  Lab 04/25/20 1135 04/25/20 1346 04/25/20 1639 04/25/20 2126 04/26/20 0740  GLUCAP 271* 250* 206* 176* 209*   Signed:  Domenic Polite MD.  [facility] 04/26/2020, 10:14 AM

## 2020-04-26 NOTE — Progress Notes (Signed)
ReDS Clip Diuretic Study Pt study # [id number]  Your patient is in the Blinded arm of the ReDS Clip Diuretic study.  Your patient has had a ReDS reading and the reading has been transmitted to the cloud.   Thank You   The research team   Bonnita Nasuti Pharm.D. CPP, BCPS Clinical Pharmacist [phone] 04/26/2020 12:05 PM    Please check [email or web address] for unit-specific pharmacist phone numbers

## 2020-04-27 ENCOUNTER — Other Ambulatory Visit: Payer: Self-pay | Admitting: *Deleted

## 2020-04-27 NOTE — Patient Outreach (Signed)
[facility]) Care Management  04/27/2020  David [name] 06-08-1940 [id number]   RN Health CoachHospitalization  Referral Date: 04/15/2019 Referral Source: Transfer from [place] Reason for Referral: Continued Disease Management Education Insurance:Medicare   Outreach Attempt:  Patient discharged from hospital to skilled nursing facility on 04/26/20.  [name] sent [facility] Liaison message of patients discharge disposition.  Plan:  [name] will close case due to patient being cared for at skilled nursing facility.  [name] will send provider case closure letter.  [name] will send patient case closure letter.   [name] [phone] [name]

## 2020-05-01 ENCOUNTER — Telehealth: Payer: Self-pay

## 2020-05-01 ENCOUNTER — Ambulatory Visit: Payer: Medicare Other | Admitting: Podiatry

## 2020-05-01 NOTE — Telephone Encounter (Signed)
David [name] is calling from [facility] stating they have been trying to reach the patient for about a week now, has left several voicemail's but has had no response. Wanted to know if Dr.Parker would liek for them to keep trying to reach patient.

## 2020-05-04 ENCOUNTER — Other Ambulatory Visit: Payer: Self-pay

## 2020-05-04 DIAGNOSIS — N184 Chronic kidney disease, stage 4 (severe): Secondary | ICD-10-CM

## 2020-05-04 NOTE — Telephone Encounter (Signed)
Pt in Rehab. Mickel Baas form [facility] Notified

## 2020-05-05 ENCOUNTER — Ambulatory Visit: Payer: Medicare Other | Admitting: Family Medicine

## 2020-05-13 ENCOUNTER — Ambulatory Visit: Payer: Medicare Other | Admitting: Family Medicine

## 2020-05-13 ENCOUNTER — Other Ambulatory Visit: Payer: Self-pay

## 2020-05-13 ENCOUNTER — Ambulatory Visit (HOSPITAL_COMMUNITY)
Admission: RE | Admit: 2020-05-13 | Discharge: 2020-05-13 | Disposition: A | Discharge status: Home / Self Care | Payer: Medicare Other | Source: Ambulatory Visit | Attending: Nephrology | Admitting: Nephrology

## 2020-05-13 VITALS — BP 115/45 | HR 59 | Temp 97.4°F | Resp 20

## 2020-05-13 DIAGNOSIS — N184 Chronic kidney disease, stage 4 (severe): Secondary | ICD-10-CM | POA: Diagnosis not present

## 2020-05-13 LAB — POCT HEMOGLOBIN-HEMACUE: Hemoglobin: 9.5 g/dL — ABNORMAL LOW (ref 13.0–17.0)

## 2020-05-13 MED ORDER — DARBEPOETIN ALFA 60 MCG/0.3ML IJ SOSY
PREFILLED_SYRINGE | INTRAMUSCULAR | Status: AC
  Filled 2020-05-13: qty 0.3

## 2020-05-13 MED ORDER — DARBEPOETIN ALFA 60 MCG/0.3ML IJ SOSY
60.0000 ug | PREFILLED_SYRINGE | INTRAMUSCULAR | Status: DC
  Administered 2020-05-13: 60 ug via SUBCUTANEOUS

## 2020-05-14 ENCOUNTER — Other Ambulatory Visit: Payer: Self-pay | Admitting: Family Medicine

## 2020-05-14 ENCOUNTER — Other Ambulatory Visit: Payer: Self-pay | Admitting: Endocrinology

## 2020-05-18 ENCOUNTER — Encounter: Payer: Self-pay | Admitting: Family Medicine

## 2020-05-18 ENCOUNTER — Telehealth (INDEPENDENT_AMBULATORY_CARE_PROVIDER_SITE_OTHER): Payer: Medicare Other | Admitting: Family Medicine

## 2020-05-18 VITALS — BP 164/61 | HR 67 | Ht 76.0 in | Wt 265.0 lb

## 2020-05-18 DIAGNOSIS — M109 Gout, unspecified: Secondary | ICD-10-CM | POA: Diagnosis not present

## 2020-05-18 DIAGNOSIS — N184 Chronic kidney disease, stage 4 (severe): Secondary | ICD-10-CM | POA: Diagnosis not present

## 2020-05-18 NOTE — Progress Notes (Signed)
   David [name] is a 80 y.o. male who presents today for a telephone visit.  Assessment/Plan:  Chronic Problems Addressed Today: Gout Seems to be doing better.  May need to start allopurinol.  Will need to recheck uric acid level when he comes back in for blood work.  CKD (chronic kidney disease) stage 4, GFR 15-29 ml/min (HCC) Weight is up a little bit since being discharged.  Advised wife to take next dose of torsemide to get him back down to his dry weight.  He will be following up with nephrology in a couple of weeks.     Subjective:  HPI:  Patient admitted to the hospital on 04/20/2020 with acute respiratory failure secondary to volume overload. He was admitted for IV diuresis.  Discharged to SNF on 04/26/2020.  He was discharged from SNF on 05/08/2020.  He has been doing okay since being home.  He was dealing with a gout flare while in the hospital that is mostly resolved.  He will be following up with nephrology in a couple of weeks.  Has not yet started hemodialysis.  He has been tolerating torsemide well.  Wife notes that he has gained about 10 pounds since coming home from the hospital.  Overall has had less swelling.  They have been compliant with torsemide 20 mg twice daily.  No shortness of breath.       Objective/Observations   NAD  Telephone Visit   I connected with David [name] on 05/18/20 at 10:00 AM EST via telephone and verified that I am speaking with the correct person using two identifiers. I discussed the limitations of evaluation and management by telemedicine and the availability of in person appointments. The patient expressed understanding and agreed to proceed.   Patient location: Home Provider location: [place] participating in the virtual visit: Myself and Patient and Patient's Wife  A total of 15 minutes were spent on medical discussion.      [name], MD 05/18/2020 10:39 AM

## 2020-05-18 NOTE — Assessment & Plan Note (Signed)
Seems to be doing better.  May need to start allopurinol.  Will need to recheck uric acid level when he comes back in for blood work.

## 2020-05-18 NOTE — Assessment & Plan Note (Signed)
Weight is up a little bit since being discharged.  Advised wife to take next dose of torsemide to get him back down to his dry weight.  He will be following up with nephrology in a couple of weeks.

## 2020-05-20 ENCOUNTER — Ambulatory Visit (INDEPENDENT_AMBULATORY_CARE_PROVIDER_SITE_OTHER): Payer: Self-pay | Admitting: Physician Assistant

## 2020-05-20 ENCOUNTER — Other Ambulatory Visit: Payer: Self-pay

## 2020-05-20 ENCOUNTER — Ambulatory Visit (HOSPITAL_COMMUNITY)
Admission: RE | Admit: 2020-05-20 | Discharge: 2020-05-20 | Disposition: A | Discharge status: Home / Self Care | Payer: Medicare Other | Source: Ambulatory Visit | Attending: Physician Assistant | Admitting: Physician Assistant

## 2020-05-20 VITALS — BP 152/59 | HR 63 | Temp 97.7°F | Resp 20 | Ht 76.0 in | Wt 265.0 lb

## 2020-05-20 DIAGNOSIS — N184 Chronic kidney disease, stage 4 (severe): Secondary | ICD-10-CM | POA: Diagnosis not present

## 2020-05-20 NOTE — Progress Notes (Signed)
    Postoperative Access Visit   History of Present Illness   David [name] is a 80 y.o. year old male who presents for postoperative follow-up for left brachiocephalic Arteriovenous fistula by Dr. Stanford Breed on 04/16/20. The patient's wounds are healing well.   The patient notes no steal symptoms. He has currently not started Hemodialysis. His Nephrologist is Dr. Marval Regal. He has follow up with him on 06/01/20  Physical Examination   Vitals:   05/20/20 1410  BP: (!) 152/59  Pulse: 63  Resp: 20  Temp: 97.7 F (36.5 C)  SpO2: 97%  Weight: 265 lb (120.2 kg)  Height: 6\' 4"  (1.93 m)   Body mass index is 32.26 kg/m.  left arm Incision is healing well, 1+ radial pulse, hand grip is 5/5, sensation in digits is intact, palpable thrill, bruit can be auscultated. The fistula is easily palpable in the left upper extremity. It is very tortuous as it tracks proximally from [facility] fossa    Medical Decision Making   David [name] is a 80 y.o. year old male who presents s/p left brachiocephalic Arteriovenous fistula by Dr. Stanford Breed on 04/16/20. He is without any steal symptoms. Duplex today reveals patent fistula with adequate diameter and borderline adequate depth. There is tortuosity noted and a competing branch at the [facility] fossa. Clinically the fistula is easily palpable in the left upper extremity it however is very tortuous. This may present a problem with cannulation in the future. He is not currently on HD so I do not feel need to be overly aggressive with scheduling a revision of the fistula at this time. I will bring him back in 8 weeks for repeat Duplex to make sure it continues to mature adequately and to see Dr. Stanford Breed to discuss possible revision  Karoline Caldwell, PA-C [facility] Office: [phone]  Clinic MD: Dr. Oneida Alar

## 2020-05-21 ENCOUNTER — Other Ambulatory Visit: Payer: Self-pay

## 2020-05-21 DIAGNOSIS — I69354 Hemiplegia and hemiparesis following cerebral infarction affecting left non-dominant side: Secondary | ICD-10-CM | POA: Diagnosis not present

## 2020-05-21 DIAGNOSIS — F32A Depression, unspecified: Secondary | ICD-10-CM | POA: Diagnosis not present

## 2020-05-21 DIAGNOSIS — Z7901 Long term (current) use of anticoagulants: Secondary | ICD-10-CM | POA: Diagnosis not present

## 2020-05-21 DIAGNOSIS — I130 Hypertensive heart and chronic kidney disease with heart failure and stage 1 through stage 4 chronic kidney disease, or unspecified chronic kidney disease: Secondary | ICD-10-CM | POA: Diagnosis not present

## 2020-05-21 DIAGNOSIS — Z6832 Body mass index (BMI) 32.0-32.9, adult: Secondary | ICD-10-CM | POA: Diagnosis not present

## 2020-05-21 DIAGNOSIS — Z794 Long term (current) use of insulin: Secondary | ICD-10-CM | POA: Diagnosis not present

## 2020-05-21 DIAGNOSIS — Z9181 History of falling: Secondary | ICD-10-CM | POA: Diagnosis not present

## 2020-05-21 DIAGNOSIS — E1151 Type 2 diabetes mellitus with diabetic peripheral angiopathy without gangrene: Secondary | ICD-10-CM | POA: Diagnosis not present

## 2020-05-21 DIAGNOSIS — F419 Anxiety disorder, unspecified: Secondary | ICD-10-CM | POA: Diagnosis not present

## 2020-05-21 DIAGNOSIS — R32 Unspecified urinary incontinence: Secondary | ICD-10-CM | POA: Diagnosis not present

## 2020-05-21 DIAGNOSIS — E785 Hyperlipidemia, unspecified: Secondary | ICD-10-CM | POA: Diagnosis not present

## 2020-05-21 DIAGNOSIS — N184 Chronic kidney disease, stage 4 (severe): Secondary | ICD-10-CM

## 2020-05-21 DIAGNOSIS — E1122 Type 2 diabetes mellitus with diabetic chronic kidney disease: Secondary | ICD-10-CM | POA: Diagnosis not present

## 2020-05-21 DIAGNOSIS — I509 Heart failure, unspecified: Secondary | ICD-10-CM | POA: Diagnosis not present

## 2020-05-22 DIAGNOSIS — E113313 Type 2 diabetes mellitus with moderate nonproliferative diabetic retinopathy with macular edema, bilateral: Secondary | ICD-10-CM | POA: Diagnosis not present

## 2020-05-26 DIAGNOSIS — N184 Chronic kidney disease, stage 4 (severe): Secondary | ICD-10-CM | POA: Diagnosis not present

## 2020-05-26 DIAGNOSIS — E1151 Type 2 diabetes mellitus with diabetic peripheral angiopathy without gangrene: Secondary | ICD-10-CM | POA: Diagnosis not present

## 2020-05-26 DIAGNOSIS — E1122 Type 2 diabetes mellitus with diabetic chronic kidney disease: Secondary | ICD-10-CM | POA: Diagnosis not present

## 2020-05-26 DIAGNOSIS — I509 Heart failure, unspecified: Secondary | ICD-10-CM | POA: Diagnosis not present

## 2020-05-26 DIAGNOSIS — I69354 Hemiplegia and hemiparesis following cerebral infarction affecting left non-dominant side: Secondary | ICD-10-CM | POA: Diagnosis not present

## 2020-05-26 DIAGNOSIS — I130 Hypertensive heart and chronic kidney disease with heart failure and stage 1 through stage 4 chronic kidney disease, or unspecified chronic kidney disease: Secondary | ICD-10-CM | POA: Diagnosis not present

## 2020-05-27 DIAGNOSIS — I509 Heart failure, unspecified: Secondary | ICD-10-CM | POA: Diagnosis not present

## 2020-05-27 DIAGNOSIS — N184 Chronic kidney disease, stage 4 (severe): Secondary | ICD-10-CM | POA: Diagnosis not present

## 2020-05-27 DIAGNOSIS — E1122 Type 2 diabetes mellitus with diabetic chronic kidney disease: Secondary | ICD-10-CM | POA: Diagnosis not present

## 2020-05-27 DIAGNOSIS — I130 Hypertensive heart and chronic kidney disease with heart failure and stage 1 through stage 4 chronic kidney disease, or unspecified chronic kidney disease: Secondary | ICD-10-CM | POA: Diagnosis not present

## 2020-05-27 DIAGNOSIS — I69354 Hemiplegia and hemiparesis following cerebral infarction affecting left non-dominant side: Secondary | ICD-10-CM | POA: Diagnosis not present

## 2020-05-27 DIAGNOSIS — E1151 Type 2 diabetes mellitus with diabetic peripheral angiopathy without gangrene: Secondary | ICD-10-CM | POA: Diagnosis not present

## 2020-05-28 DIAGNOSIS — N184 Chronic kidney disease, stage 4 (severe): Secondary | ICD-10-CM | POA: Diagnosis not present

## 2020-05-28 DIAGNOSIS — I509 Heart failure, unspecified: Secondary | ICD-10-CM | POA: Diagnosis not present

## 2020-05-28 DIAGNOSIS — I69354 Hemiplegia and hemiparesis following cerebral infarction affecting left non-dominant side: Secondary | ICD-10-CM | POA: Diagnosis not present

## 2020-05-28 DIAGNOSIS — E1122 Type 2 diabetes mellitus with diabetic chronic kidney disease: Secondary | ICD-10-CM | POA: Diagnosis not present

## 2020-05-28 DIAGNOSIS — I130 Hypertensive heart and chronic kidney disease with heart failure and stage 1 through stage 4 chronic kidney disease, or unspecified chronic kidney disease: Secondary | ICD-10-CM | POA: Diagnosis not present

## 2020-05-28 DIAGNOSIS — E1151 Type 2 diabetes mellitus with diabetic peripheral angiopathy without gangrene: Secondary | ICD-10-CM | POA: Diagnosis not present

## 2020-05-29 DIAGNOSIS — I509 Heart failure, unspecified: Secondary | ICD-10-CM | POA: Diagnosis not present

## 2020-05-29 DIAGNOSIS — E1122 Type 2 diabetes mellitus with diabetic chronic kidney disease: Secondary | ICD-10-CM | POA: Diagnosis not present

## 2020-05-29 DIAGNOSIS — E1151 Type 2 diabetes mellitus with diabetic peripheral angiopathy without gangrene: Secondary | ICD-10-CM | POA: Diagnosis not present

## 2020-05-29 DIAGNOSIS — N184 Chronic kidney disease, stage 4 (severe): Secondary | ICD-10-CM | POA: Diagnosis not present

## 2020-05-29 DIAGNOSIS — I130 Hypertensive heart and chronic kidney disease with heart failure and stage 1 through stage 4 chronic kidney disease, or unspecified chronic kidney disease: Secondary | ICD-10-CM | POA: Diagnosis not present

## 2020-05-29 DIAGNOSIS — I69354 Hemiplegia and hemiparesis following cerebral infarction affecting left non-dominant side: Secondary | ICD-10-CM | POA: Diagnosis not present

## 2020-06-01 DIAGNOSIS — D509 Iron deficiency anemia, unspecified: Secondary | ICD-10-CM | POA: Diagnosis not present

## 2020-06-01 DIAGNOSIS — N2581 Secondary hyperparathyroidism of renal origin: Secondary | ICD-10-CM | POA: Diagnosis not present

## 2020-06-01 DIAGNOSIS — I770 Arteriovenous fistula, acquired: Secondary | ICD-10-CM | POA: Diagnosis not present

## 2020-06-01 DIAGNOSIS — E1122 Type 2 diabetes mellitus with diabetic chronic kidney disease: Secondary | ICD-10-CM | POA: Diagnosis not present

## 2020-06-01 DIAGNOSIS — Z8674 Personal history of sudden cardiac arrest: Secondary | ICD-10-CM | POA: Diagnosis not present

## 2020-06-01 DIAGNOSIS — I129 Hypertensive chronic kidney disease with stage 1 through stage 4 chronic kidney disease, or unspecified chronic kidney disease: Secondary | ICD-10-CM | POA: Diagnosis not present

## 2020-06-01 DIAGNOSIS — N184 Chronic kidney disease, stage 4 (severe): Secondary | ICD-10-CM | POA: Diagnosis not present

## 2020-06-03 DIAGNOSIS — I509 Heart failure, unspecified: Secondary | ICD-10-CM | POA: Diagnosis not present

## 2020-06-03 DIAGNOSIS — N184 Chronic kidney disease, stage 4 (severe): Secondary | ICD-10-CM | POA: Diagnosis not present

## 2020-06-03 DIAGNOSIS — E1151 Type 2 diabetes mellitus with diabetic peripheral angiopathy without gangrene: Secondary | ICD-10-CM | POA: Diagnosis not present

## 2020-06-03 DIAGNOSIS — I69354 Hemiplegia and hemiparesis following cerebral infarction affecting left non-dominant side: Secondary | ICD-10-CM | POA: Diagnosis not present

## 2020-06-03 DIAGNOSIS — E1122 Type 2 diabetes mellitus with diabetic chronic kidney disease: Secondary | ICD-10-CM | POA: Diagnosis not present

## 2020-06-03 DIAGNOSIS — I130 Hypertensive heart and chronic kidney disease with heart failure and stage 1 through stage 4 chronic kidney disease, or unspecified chronic kidney disease: Secondary | ICD-10-CM | POA: Diagnosis not present

## 2020-06-04 DIAGNOSIS — I509 Heart failure, unspecified: Secondary | ICD-10-CM | POA: Diagnosis not present

## 2020-06-04 DIAGNOSIS — E1122 Type 2 diabetes mellitus with diabetic chronic kidney disease: Secondary | ICD-10-CM | POA: Diagnosis not present

## 2020-06-04 DIAGNOSIS — I69354 Hemiplegia and hemiparesis following cerebral infarction affecting left non-dominant side: Secondary | ICD-10-CM | POA: Diagnosis not present

## 2020-06-04 DIAGNOSIS — I130 Hypertensive heart and chronic kidney disease with heart failure and stage 1 through stage 4 chronic kidney disease, or unspecified chronic kidney disease: Secondary | ICD-10-CM | POA: Diagnosis not present

## 2020-06-04 DIAGNOSIS — E1151 Type 2 diabetes mellitus with diabetic peripheral angiopathy without gangrene: Secondary | ICD-10-CM | POA: Diagnosis not present

## 2020-06-04 DIAGNOSIS — N184 Chronic kidney disease, stage 4 (severe): Secondary | ICD-10-CM | POA: Diagnosis not present

## 2020-06-08 DIAGNOSIS — N184 Chronic kidney disease, stage 4 (severe): Secondary | ICD-10-CM | POA: Diagnosis not present

## 2020-06-08 DIAGNOSIS — I130 Hypertensive heart and chronic kidney disease with heart failure and stage 1 through stage 4 chronic kidney disease, or unspecified chronic kidney disease: Secondary | ICD-10-CM | POA: Diagnosis not present

## 2020-06-08 DIAGNOSIS — I69354 Hemiplegia and hemiparesis following cerebral infarction affecting left non-dominant side: Secondary | ICD-10-CM | POA: Diagnosis not present

## 2020-06-08 DIAGNOSIS — I509 Heart failure, unspecified: Secondary | ICD-10-CM | POA: Diagnosis not present

## 2020-06-08 DIAGNOSIS — E1122 Type 2 diabetes mellitus with diabetic chronic kidney disease: Secondary | ICD-10-CM | POA: Diagnosis not present

## 2020-06-08 DIAGNOSIS — E1151 Type 2 diabetes mellitus with diabetic peripheral angiopathy without gangrene: Secondary | ICD-10-CM | POA: Diagnosis not present

## 2020-06-09 DIAGNOSIS — E1122 Type 2 diabetes mellitus with diabetic chronic kidney disease: Secondary | ICD-10-CM | POA: Diagnosis not present

## 2020-06-09 DIAGNOSIS — I509 Heart failure, unspecified: Secondary | ICD-10-CM | POA: Diagnosis not present

## 2020-06-09 DIAGNOSIS — N184 Chronic kidney disease, stage 4 (severe): Secondary | ICD-10-CM | POA: Diagnosis not present

## 2020-06-09 DIAGNOSIS — I69354 Hemiplegia and hemiparesis following cerebral infarction affecting left non-dominant side: Secondary | ICD-10-CM | POA: Diagnosis not present

## 2020-06-09 DIAGNOSIS — I130 Hypertensive heart and chronic kidney disease with heart failure and stage 1 through stage 4 chronic kidney disease, or unspecified chronic kidney disease: Secondary | ICD-10-CM | POA: Diagnosis not present

## 2020-06-09 DIAGNOSIS — E1151 Type 2 diabetes mellitus with diabetic peripheral angiopathy without gangrene: Secondary | ICD-10-CM | POA: Diagnosis not present

## 2020-06-10 ENCOUNTER — Ambulatory Visit (HOSPITAL_COMMUNITY)
Admission: RE | Admit: 2020-06-10 | Discharge: 2020-06-10 | Disposition: A | Discharge status: Home / Self Care | Payer: Medicare Other | Source: Ambulatory Visit | Attending: Nephrology | Admitting: Nephrology

## 2020-06-10 ENCOUNTER — Other Ambulatory Visit: Payer: Self-pay

## 2020-06-10 VITALS — BP 160/50 | HR 65 | Temp 96.5°F | Resp 18

## 2020-06-10 DIAGNOSIS — N184 Chronic kidney disease, stage 4 (severe): Secondary | ICD-10-CM

## 2020-06-10 DIAGNOSIS — D631 Anemia in chronic kidney disease: Secondary | ICD-10-CM | POA: Insufficient documentation

## 2020-06-10 LAB — FERRITIN: Ferritin: 515 ng/mL — ABNORMAL HIGH (ref 24–336)

## 2020-06-10 LAB — RENAL FUNCTION PANEL
Albumin: 3 g/dL — ABNORMAL LOW (ref 3.5–5.0)
Anion gap: 12 (ref 5–15)
BUN: 74 mg/dL — ABNORMAL HIGH (ref 8–23)
CO2: 18 mmol/L — ABNORMAL LOW (ref 22–32)
Calcium: 9.2 mg/dL (ref 8.9–10.3)
Chloride: 108 mmol/L (ref 98–111)
Creatinine, Ser: 3.42 mg/dL — ABNORMAL HIGH (ref 0.61–1.24)
GFR, Estimated: 18 mL/min — ABNORMAL LOW (ref >60)
Glucose, Bld: 205 mg/dL — ABNORMAL HIGH (ref 70–99)
Phosphorus: 3.9 mg/dL (ref 2.5–4.6)
Potassium: 4.3 mmol/L (ref 3.5–5.1)
Sodium: 138 mmol/L (ref 135–145)

## 2020-06-10 LAB — IRON AND TIBC
Iron: 61 ug/dL (ref 45–182)
Saturation Ratios: 31 % (ref 17.9–39.5)
TIBC: 199 ug/dL — ABNORMAL LOW (ref 250–450)
UIBC: 138 ug/dL

## 2020-06-10 LAB — POCT HEMOGLOBIN-HEMACUE: Hemoglobin: 9.8 g/dL — ABNORMAL LOW (ref 13.0–17.0)

## 2020-06-10 MED ORDER — DARBEPOETIN ALFA 60 MCG/0.3ML IJ SOSY
PREFILLED_SYRINGE | INTRAMUSCULAR | Status: AC
  Filled 2020-06-10: qty 0.3

## 2020-06-10 MED ORDER — DARBEPOETIN ALFA 60 MCG/0.3ML IJ SOSY
60.0000 ug | PREFILLED_SYRINGE | INTRAMUSCULAR | Status: DC
  Administered 2020-06-10: 60 ug via SUBCUTANEOUS

## 2020-06-11 DIAGNOSIS — I130 Hypertensive heart and chronic kidney disease with heart failure and stage 1 through stage 4 chronic kidney disease, or unspecified chronic kidney disease: Secondary | ICD-10-CM | POA: Diagnosis not present

## 2020-06-11 DIAGNOSIS — N184 Chronic kidney disease, stage 4 (severe): Secondary | ICD-10-CM | POA: Diagnosis not present

## 2020-06-11 DIAGNOSIS — I69354 Hemiplegia and hemiparesis following cerebral infarction affecting left non-dominant side: Secondary | ICD-10-CM | POA: Diagnosis not present

## 2020-06-11 DIAGNOSIS — E1151 Type 2 diabetes mellitus with diabetic peripheral angiopathy without gangrene: Secondary | ICD-10-CM | POA: Diagnosis not present

## 2020-06-11 DIAGNOSIS — I509 Heart failure, unspecified: Secondary | ICD-10-CM | POA: Diagnosis not present

## 2020-06-11 DIAGNOSIS — E1122 Type 2 diabetes mellitus with diabetic chronic kidney disease: Secondary | ICD-10-CM | POA: Diagnosis not present

## 2020-06-15 DIAGNOSIS — E1122 Type 2 diabetes mellitus with diabetic chronic kidney disease: Secondary | ICD-10-CM | POA: Diagnosis not present

## 2020-06-15 DIAGNOSIS — I130 Hypertensive heart and chronic kidney disease with heart failure and stage 1 through stage 4 chronic kidney disease, or unspecified chronic kidney disease: Secondary | ICD-10-CM | POA: Diagnosis not present

## 2020-06-15 DIAGNOSIS — I509 Heart failure, unspecified: Secondary | ICD-10-CM | POA: Diagnosis not present

## 2020-06-15 DIAGNOSIS — E1151 Type 2 diabetes mellitus with diabetic peripheral angiopathy without gangrene: Secondary | ICD-10-CM | POA: Diagnosis not present

## 2020-06-15 DIAGNOSIS — N184 Chronic kidney disease, stage 4 (severe): Secondary | ICD-10-CM | POA: Diagnosis not present

## 2020-06-15 DIAGNOSIS — I69354 Hemiplegia and hemiparesis following cerebral infarction affecting left non-dominant side: Secondary | ICD-10-CM | POA: Diagnosis not present

## 2020-06-16 ENCOUNTER — Ambulatory Visit: Payer: Medicare Other | Admitting: *Deleted

## 2020-06-16 DIAGNOSIS — E1151 Type 2 diabetes mellitus with diabetic peripheral angiopathy without gangrene: Secondary | ICD-10-CM | POA: Diagnosis not present

## 2020-06-16 DIAGNOSIS — I509 Heart failure, unspecified: Secondary | ICD-10-CM | POA: Diagnosis not present

## 2020-06-16 DIAGNOSIS — I69354 Hemiplegia and hemiparesis following cerebral infarction affecting left non-dominant side: Secondary | ICD-10-CM | POA: Diagnosis not present

## 2020-06-16 DIAGNOSIS — E1122 Type 2 diabetes mellitus with diabetic chronic kidney disease: Secondary | ICD-10-CM | POA: Diagnosis not present

## 2020-06-16 DIAGNOSIS — I130 Hypertensive heart and chronic kidney disease with heart failure and stage 1 through stage 4 chronic kidney disease, or unspecified chronic kidney disease: Secondary | ICD-10-CM | POA: Diagnosis not present

## 2020-06-16 DIAGNOSIS — N184 Chronic kidney disease, stage 4 (severe): Secondary | ICD-10-CM | POA: Diagnosis not present

## 2020-06-17 ENCOUNTER — Encounter: Payer: Self-pay | Admitting: Podiatry

## 2020-06-17 ENCOUNTER — Other Ambulatory Visit: Payer: Self-pay

## 2020-06-17 ENCOUNTER — Ambulatory Visit (INDEPENDENT_AMBULATORY_CARE_PROVIDER_SITE_OTHER): Payer: Medicare Other

## 2020-06-17 ENCOUNTER — Ambulatory Visit (INDEPENDENT_AMBULATORY_CARE_PROVIDER_SITE_OTHER): Payer: Medicare Other | Admitting: Podiatry

## 2020-06-17 DIAGNOSIS — M109 Gout, unspecified: Secondary | ICD-10-CM

## 2020-06-17 DIAGNOSIS — E1142 Type 2 diabetes mellitus with diabetic polyneuropathy: Secondary | ICD-10-CM

## 2020-06-17 DIAGNOSIS — L84 Corns and callosities: Secondary | ICD-10-CM

## 2020-06-17 DIAGNOSIS — B351 Tinea unguium: Secondary | ICD-10-CM

## 2020-06-17 DIAGNOSIS — Z89421 Acquired absence of other right toe(s): Secondary | ICD-10-CM

## 2020-06-18 DIAGNOSIS — I130 Hypertensive heart and chronic kidney disease with heart failure and stage 1 through stage 4 chronic kidney disease, or unspecified chronic kidney disease: Secondary | ICD-10-CM | POA: Diagnosis not present

## 2020-06-18 DIAGNOSIS — E1122 Type 2 diabetes mellitus with diabetic chronic kidney disease: Secondary | ICD-10-CM | POA: Diagnosis not present

## 2020-06-18 DIAGNOSIS — E1151 Type 2 diabetes mellitus with diabetic peripheral angiopathy without gangrene: Secondary | ICD-10-CM | POA: Diagnosis not present

## 2020-06-18 DIAGNOSIS — I509 Heart failure, unspecified: Secondary | ICD-10-CM | POA: Diagnosis not present

## 2020-06-18 DIAGNOSIS — I69354 Hemiplegia and hemiparesis following cerebral infarction affecting left non-dominant side: Secondary | ICD-10-CM | POA: Diagnosis not present

## 2020-06-18 DIAGNOSIS — N184 Chronic kidney disease, stage 4 (severe): Secondary | ICD-10-CM | POA: Diagnosis not present

## 2020-06-20 DIAGNOSIS — Z7901 Long term (current) use of anticoagulants: Secondary | ICD-10-CM | POA: Diagnosis not present

## 2020-06-20 DIAGNOSIS — E785 Hyperlipidemia, unspecified: Secondary | ICD-10-CM | POA: Diagnosis not present

## 2020-06-20 DIAGNOSIS — N184 Chronic kidney disease, stage 4 (severe): Secondary | ICD-10-CM | POA: Diagnosis not present

## 2020-06-20 DIAGNOSIS — F419 Anxiety disorder, unspecified: Secondary | ICD-10-CM | POA: Diagnosis not present

## 2020-06-20 DIAGNOSIS — E1151 Type 2 diabetes mellitus with diabetic peripheral angiopathy without gangrene: Secondary | ICD-10-CM | POA: Diagnosis not present

## 2020-06-20 DIAGNOSIS — R32 Unspecified urinary incontinence: Secondary | ICD-10-CM | POA: Diagnosis not present

## 2020-06-20 DIAGNOSIS — Z9181 History of falling: Secondary | ICD-10-CM | POA: Diagnosis not present

## 2020-06-20 DIAGNOSIS — I69354 Hemiplegia and hemiparesis following cerebral infarction affecting left non-dominant side: Secondary | ICD-10-CM | POA: Diagnosis not present

## 2020-06-20 DIAGNOSIS — I130 Hypertensive heart and chronic kidney disease with heart failure and stage 1 through stage 4 chronic kidney disease, or unspecified chronic kidney disease: Secondary | ICD-10-CM | POA: Diagnosis not present

## 2020-06-20 DIAGNOSIS — E1122 Type 2 diabetes mellitus with diabetic chronic kidney disease: Secondary | ICD-10-CM | POA: Diagnosis not present

## 2020-06-20 DIAGNOSIS — F32A Depression, unspecified: Secondary | ICD-10-CM | POA: Diagnosis not present

## 2020-06-20 DIAGNOSIS — I509 Heart failure, unspecified: Secondary | ICD-10-CM | POA: Diagnosis not present

## 2020-06-20 DIAGNOSIS — Z6832 Body mass index (BMI) 32.0-32.9, adult: Secondary | ICD-10-CM | POA: Diagnosis not present

## 2020-06-20 DIAGNOSIS — Z794 Long term (current) use of insulin: Secondary | ICD-10-CM | POA: Diagnosis not present

## 2020-06-22 NOTE — Progress Notes (Signed)
Subjective: David [name] presents today for follow up of at risk foot care. Patient has h/o amputation of R hallux and corn(s) left 3rd digit and painful mycotic toenails b/l that are difficult to trim. Pain interferes with ambulation. Aggravating factors include wearing enclosed shoe gear. Pain is relieved with periodic professional debridement.   His wife is present during today's visit.They state [name] has been hospitalized since his last visit. He is now dealing with kidney issues. Patient also had an acute gout attack of both wrists and left hallux. Uric acid was 14 and has been checked again since his initial attack. His wrists feel better. They state left great toe is red and there is a wound on the digit. It has improved in degree of redness and swelling has gotten better, but redness is still an area of concern for them. [name] has tube of Mupirocin Ointment and has been applying it to digit. Denies any fever, chills, night sweats, nausea or vomiting.  Allergies  Allergen Reactions  . Ativan [Lorazepam] Anxiety and Other (See Comments)    Pt gets more agitated  . Adhesive [Tape] Other (See Comments)    blisters     Objective: There were no vitals filed for this visit.  Pt 80 y.o. year old Caucasian male morbidly obese in NAD. AAO x 3.   Vascular Examination:  Capillary refill time to remaining digits delayed b/l. Faintly palpable DP pulses b/l. Pedal hair absent b/l Skin temperature gradient within normal limits b/l.  Dermatological Examination: Pedal skin with normal turgor, texture and tone bilaterally. No interdigital macerations bilaterally. Toenails L hallux, L 2nd toe, L 3rd toe, L 4th toe, L 5th toe and R 3rd toe elongated, dystrophic, thickened, and crumbly with subungual debris and tenderness to dorsal palpation. Hyperkeratotic lesion(s) L 3rd toe distal tip.  No erythema, no edema, no drainage, no fluctuance.  Left hallux with blanchable erythema and  visible areas of gouty tophi noted on dorsal aspect of left hallux. No purulence expressed. No odor, no active drainage.  Musculoskeletal: Normal muscle strength 5/5 to all lower extremity muscle groups bilaterally, no pain crepitus or joint limitation noted with ROM b/l and digital amputation right hallux, right 2nd toe.  Neurological: Protective sensation diminished with 10g monofilament b/l.  Assessment: 1. Onychomycosis   2. Acute gout involving toe of left foot, unspecified cause   3. Corns   4. Status post amputation of lesser toe of right foot ([place])   5. Diabetic peripheral neuropathy associated with type 2 diabetes mellitus ([place])    Plan: -Examined patient. -Left hallux with gouty tophi which is stable. Advised them to apply Mupirocin Ointment to digit once daily until healed. Call if condition worsens. -I have asked them to discuss long term gout management with PCP or nephrologist since he will be going on dialysis. -Continue diabetic foot care principles. -Corn(s) pared left 3rd digit utilizing sterile scalpel blade without incident. -Toenails L hallux, L 2nd toe, L 3rd toe, L 4th toe, L 5th toe, R 3rd toe, R 4th toe and R 5th toe debrided in length and girth without iatrogenic bleeding with sterile nail nipper and dremel.  -Patient to continue soft, supportive shoe gear daily. -Patient to report any pedal injuries to medical professional immediately. -Patient/POA to call should there be question/concern in the interim.  Return in about 3 months (around 09/14/2020) for nail trim.

## 2020-06-22 NOTE — Progress Notes (Signed)
This has been faxed to Janett Billow, RN with [facility] at the fax number you provided.

## 2020-06-23 DIAGNOSIS — I509 Heart failure, unspecified: Secondary | ICD-10-CM | POA: Diagnosis not present

## 2020-06-23 DIAGNOSIS — E1151 Type 2 diabetes mellitus with diabetic peripheral angiopathy without gangrene: Secondary | ICD-10-CM | POA: Diagnosis not present

## 2020-06-23 DIAGNOSIS — I69354 Hemiplegia and hemiparesis following cerebral infarction affecting left non-dominant side: Secondary | ICD-10-CM | POA: Diagnosis not present

## 2020-06-23 DIAGNOSIS — I130 Hypertensive heart and chronic kidney disease with heart failure and stage 1 through stage 4 chronic kidney disease, or unspecified chronic kidney disease: Secondary | ICD-10-CM | POA: Diagnosis not present

## 2020-06-23 DIAGNOSIS — N184 Chronic kidney disease, stage 4 (severe): Secondary | ICD-10-CM | POA: Diagnosis not present

## 2020-06-23 DIAGNOSIS — E1122 Type 2 diabetes mellitus with diabetic chronic kidney disease: Secondary | ICD-10-CM | POA: Diagnosis not present

## 2020-06-25 DIAGNOSIS — I509 Heart failure, unspecified: Secondary | ICD-10-CM | POA: Diagnosis not present

## 2020-06-25 DIAGNOSIS — E1151 Type 2 diabetes mellitus with diabetic peripheral angiopathy without gangrene: Secondary | ICD-10-CM | POA: Diagnosis not present

## 2020-06-25 DIAGNOSIS — I69354 Hemiplegia and hemiparesis following cerebral infarction affecting left non-dominant side: Secondary | ICD-10-CM | POA: Diagnosis not present

## 2020-06-25 DIAGNOSIS — E1122 Type 2 diabetes mellitus with diabetic chronic kidney disease: Secondary | ICD-10-CM | POA: Diagnosis not present

## 2020-06-25 DIAGNOSIS — N184 Chronic kidney disease, stage 4 (severe): Secondary | ICD-10-CM | POA: Diagnosis not present

## 2020-06-25 DIAGNOSIS — I130 Hypertensive heart and chronic kidney disease with heart failure and stage 1 through stage 4 chronic kidney disease, or unspecified chronic kidney disease: Secondary | ICD-10-CM | POA: Diagnosis not present

## 2020-06-30 DIAGNOSIS — E1122 Type 2 diabetes mellitus with diabetic chronic kidney disease: Secondary | ICD-10-CM | POA: Diagnosis not present

## 2020-06-30 DIAGNOSIS — N184 Chronic kidney disease, stage 4 (severe): Secondary | ICD-10-CM | POA: Diagnosis not present

## 2020-06-30 DIAGNOSIS — I69354 Hemiplegia and hemiparesis following cerebral infarction affecting left non-dominant side: Secondary | ICD-10-CM | POA: Diagnosis not present

## 2020-06-30 DIAGNOSIS — I509 Heart failure, unspecified: Secondary | ICD-10-CM | POA: Diagnosis not present

## 2020-06-30 DIAGNOSIS — E1151 Type 2 diabetes mellitus with diabetic peripheral angiopathy without gangrene: Secondary | ICD-10-CM | POA: Diagnosis not present

## 2020-06-30 DIAGNOSIS — I130 Hypertensive heart and chronic kidney disease with heart failure and stage 1 through stage 4 chronic kidney disease, or unspecified chronic kidney disease: Secondary | ICD-10-CM | POA: Diagnosis not present

## 2020-07-01 ENCOUNTER — Other Ambulatory Visit: Payer: Self-pay | Admitting: Endocrinology

## 2020-07-02 DIAGNOSIS — I130 Hypertensive heart and chronic kidney disease with heart failure and stage 1 through stage 4 chronic kidney disease, or unspecified chronic kidney disease: Secondary | ICD-10-CM | POA: Diagnosis not present

## 2020-07-02 DIAGNOSIS — N184 Chronic kidney disease, stage 4 (severe): Secondary | ICD-10-CM | POA: Diagnosis not present

## 2020-07-02 DIAGNOSIS — I509 Heart failure, unspecified: Secondary | ICD-10-CM | POA: Diagnosis not present

## 2020-07-02 DIAGNOSIS — E1151 Type 2 diabetes mellitus with diabetic peripheral angiopathy without gangrene: Secondary | ICD-10-CM | POA: Diagnosis not present

## 2020-07-02 DIAGNOSIS — E1122 Type 2 diabetes mellitus with diabetic chronic kidney disease: Secondary | ICD-10-CM | POA: Diagnosis not present

## 2020-07-02 DIAGNOSIS — I69354 Hemiplegia and hemiparesis following cerebral infarction affecting left non-dominant side: Secondary | ICD-10-CM | POA: Diagnosis not present

## 2020-07-03 DIAGNOSIS — E1122 Type 2 diabetes mellitus with diabetic chronic kidney disease: Secondary | ICD-10-CM | POA: Diagnosis not present

## 2020-07-03 DIAGNOSIS — I69354 Hemiplegia and hemiparesis following cerebral infarction affecting left non-dominant side: Secondary | ICD-10-CM | POA: Diagnosis not present

## 2020-07-03 DIAGNOSIS — N184 Chronic kidney disease, stage 4 (severe): Secondary | ICD-10-CM | POA: Diagnosis not present

## 2020-07-03 DIAGNOSIS — E1151 Type 2 diabetes mellitus with diabetic peripheral angiopathy without gangrene: Secondary | ICD-10-CM | POA: Diagnosis not present

## 2020-07-03 DIAGNOSIS — I130 Hypertensive heart and chronic kidney disease with heart failure and stage 1 through stage 4 chronic kidney disease, or unspecified chronic kidney disease: Secondary | ICD-10-CM | POA: Diagnosis not present

## 2020-07-03 DIAGNOSIS — I509 Heart failure, unspecified: Secondary | ICD-10-CM | POA: Diagnosis not present

## 2020-07-08 ENCOUNTER — Ambulatory Visit (HOSPITAL_COMMUNITY)
Admission: RE | Admit: 2020-07-08 | Discharge: 2020-07-08 | Disposition: A | Discharge status: Home / Self Care | Payer: Medicare Other | Source: Ambulatory Visit | Attending: Nephrology | Admitting: Nephrology

## 2020-07-08 ENCOUNTER — Other Ambulatory Visit: Payer: Self-pay

## 2020-07-08 VITALS — BP 164/55 | HR 61 | Temp 97.8°F | Resp 18

## 2020-07-08 DIAGNOSIS — N184 Chronic kidney disease, stage 4 (severe): Secondary | ICD-10-CM | POA: Diagnosis not present

## 2020-07-08 DIAGNOSIS — D631 Anemia in chronic kidney disease: Secondary | ICD-10-CM | POA: Diagnosis not present

## 2020-07-08 LAB — RENAL FUNCTION PANEL
Albumin: 3.3 g/dL — ABNORMAL LOW (ref 3.5–5.0)
Anion gap: 13 (ref 5–15)
BUN: 86 mg/dL — ABNORMAL HIGH (ref 8–23)
CO2: 18 mmol/L — ABNORMAL LOW (ref 22–32)
Calcium: 9.2 mg/dL (ref 8.9–10.3)
Chloride: 107 mmol/L (ref 98–111)
Creatinine, Ser: 3.35 mg/dL — ABNORMAL HIGH (ref 0.61–1.24)
GFR, Estimated: 18 mL/min — ABNORMAL LOW (ref >60)
Glucose, Bld: 132 mg/dL — ABNORMAL HIGH (ref 70–99)
Phosphorus: 3.9 mg/dL (ref 2.5–4.6)
Potassium: 4.1 mmol/L (ref 3.5–5.1)
Sodium: 138 mmol/L (ref 135–145)

## 2020-07-08 LAB — POCT HEMOGLOBIN-HEMACUE: Hemoglobin: 9.8 g/dL — ABNORMAL LOW (ref 13.0–17.0)

## 2020-07-08 MED ORDER — DARBEPOETIN ALFA 60 MCG/0.3ML IJ SOSY: 60.0000 ug | PREFILLED_SYRINGE | INTRAMUSCULAR | Status: DC

## 2020-07-08 MED ORDER — DARBEPOETIN ALFA 60 MCG/0.3ML IJ SOSY
PREFILLED_SYRINGE | INTRAMUSCULAR | Status: AC
  Administered 2020-07-08: 60 ug via SUBCUTANEOUS
  Filled 2020-07-08: qty 0.3

## 2020-07-09 DIAGNOSIS — E1151 Type 2 diabetes mellitus with diabetic peripheral angiopathy without gangrene: Secondary | ICD-10-CM | POA: Diagnosis not present

## 2020-07-09 DIAGNOSIS — E1122 Type 2 diabetes mellitus with diabetic chronic kidney disease: Secondary | ICD-10-CM | POA: Diagnosis not present

## 2020-07-09 DIAGNOSIS — I69354 Hemiplegia and hemiparesis following cerebral infarction affecting left non-dominant side: Secondary | ICD-10-CM | POA: Diagnosis not present

## 2020-07-09 DIAGNOSIS — I130 Hypertensive heart and chronic kidney disease with heart failure and stage 1 through stage 4 chronic kidney disease, or unspecified chronic kidney disease: Secondary | ICD-10-CM | POA: Diagnosis not present

## 2020-07-09 DIAGNOSIS — I509 Heart failure, unspecified: Secondary | ICD-10-CM | POA: Diagnosis not present

## 2020-07-09 DIAGNOSIS — N184 Chronic kidney disease, stage 4 (severe): Secondary | ICD-10-CM | POA: Diagnosis not present

## 2020-07-10 ENCOUNTER — Ambulatory Visit: Payer: Medicare Other | Admitting: Orthotics

## 2020-07-10 DIAGNOSIS — E1151 Type 2 diabetes mellitus with diabetic peripheral angiopathy without gangrene: Secondary | ICD-10-CM | POA: Diagnosis not present

## 2020-07-10 DIAGNOSIS — I130 Hypertensive heart and chronic kidney disease with heart failure and stage 1 through stage 4 chronic kidney disease, or unspecified chronic kidney disease: Secondary | ICD-10-CM | POA: Diagnosis not present

## 2020-07-10 DIAGNOSIS — I69354 Hemiplegia and hemiparesis following cerebral infarction affecting left non-dominant side: Secondary | ICD-10-CM | POA: Diagnosis not present

## 2020-07-10 DIAGNOSIS — E1122 Type 2 diabetes mellitus with diabetic chronic kidney disease: Secondary | ICD-10-CM | POA: Diagnosis not present

## 2020-07-10 DIAGNOSIS — N184 Chronic kidney disease, stage 4 (severe): Secondary | ICD-10-CM | POA: Diagnosis not present

## 2020-07-10 DIAGNOSIS — I509 Heart failure, unspecified: Secondary | ICD-10-CM | POA: Diagnosis not present

## 2020-07-11 ENCOUNTER — Other Ambulatory Visit: Payer: Self-pay | Admitting: Endocrinology

## 2020-07-11 DIAGNOSIS — E1165 Type 2 diabetes mellitus with hyperglycemia: Secondary | ICD-10-CM

## 2020-07-11 DIAGNOSIS — Z794 Long term (current) use of insulin: Secondary | ICD-10-CM

## 2020-07-13 DIAGNOSIS — E113313 Type 2 diabetes mellitus with moderate nonproliferative diabetic retinopathy with macular edema, bilateral: Secondary | ICD-10-CM | POA: Diagnosis not present

## 2020-07-13 DIAGNOSIS — H35361 Drusen (degenerative) of macula, right eye: Secondary | ICD-10-CM | POA: Diagnosis not present

## 2020-07-13 DIAGNOSIS — H35373 Puckering of macula, bilateral: Secondary | ICD-10-CM | POA: Diagnosis not present

## 2020-07-13 DIAGNOSIS — H31091 Other chorioretinal scars, right eye: Secondary | ICD-10-CM | POA: Diagnosis not present

## 2020-07-15 ENCOUNTER — Ambulatory Visit: Payer: Medicare Other | Admitting: Adult Health

## 2020-07-15 DIAGNOSIS — N184 Chronic kidney disease, stage 4 (severe): Secondary | ICD-10-CM | POA: Diagnosis not present

## 2020-07-15 DIAGNOSIS — E1122 Type 2 diabetes mellitus with diabetic chronic kidney disease: Secondary | ICD-10-CM | POA: Diagnosis not present

## 2020-07-15 DIAGNOSIS — I509 Heart failure, unspecified: Secondary | ICD-10-CM | POA: Diagnosis not present

## 2020-07-15 DIAGNOSIS — E1151 Type 2 diabetes mellitus with diabetic peripheral angiopathy without gangrene: Secondary | ICD-10-CM | POA: Diagnosis not present

## 2020-07-15 DIAGNOSIS — I130 Hypertensive heart and chronic kidney disease with heart failure and stage 1 through stage 4 chronic kidney disease, or unspecified chronic kidney disease: Secondary | ICD-10-CM | POA: Diagnosis not present

## 2020-07-15 DIAGNOSIS — I69354 Hemiplegia and hemiparesis following cerebral infarction affecting left non-dominant side: Secondary | ICD-10-CM | POA: Diagnosis not present

## 2020-07-15 NOTE — Progress Notes (Deleted)
[facility] [address] [place]. [name] [address] [phone]       OFFICE FOLLOW UP NOTE  [name] Date of Birth:  Oct 12, 1940 Medical Record Number:  [id number]   Reason for visit: Stroke follow up  CHIEF COMPLAINT:  No chief complaint on file.   HPI:  Today, 07/15/2020, [name] returns for 56-monthstroke follow-up.  Residual stroke cognitive impairment stable Denies new or worsening stroke/TIA symptoms  Reports compliance on Plavix and atorvastatin -denies side effects Blood pressure today *** Glucose levels ***  S/p L brachiocephalic AV fistula 109/9/83without complication    History provided for reference purposes only Update 01/15/2020 JM: [name] for stroke follow-up Residual deficits of cognitive impairment which has been stable MMSE today 24/30 (prior 21/30) Denies any new or worsening stroke/TIA symptoms Remains on Plavix without bleeding or bruising Remains on atorvastatin without myalgias Recent lipid panel showed direct LDL 45 Blood pressure today 106/55 Glucose levels stable with recent A1c 6.9 currently managed by endocrinology Does have complaints of increased fatigue, shortness of breath and occasional headaches has follow-up on Friday with sleep apnea provider to ensure adequate management with CPAP use SOB only with exertion which has been gradually worsening over the past 6 months -he was advised to follow-up with cardiology for further evaluation but also discussed possible deconditioning due to sedentary lifestyle Reports experiencing approximately 3 headaches since prior visit 6 months ago.  Consistent of top of head pressure type pain limiting daily activity but denies any associated symptoms such as photophobia, phonophobia, N/V, visual changes or any other neurological symptoms.  Mild benefit with use of Tylenol. No further concerns at this time  Update 06/26/2019 JM: [name] a 80year old  male who is being seen today for follow-up regarding history of stroke and post stroke cognitive impairment accompanied by his wife.  Short term memory has been stable since prior visit without worsening but denies improvement.  He has continued to do word searchs and word puzzles to help with memory as previously recommended. MMSE 21/30.  After prior visit: Dementia panel showed elevated homocysteine level therefore folic acid 1 mg daily initiated which he endorses ongoing compliance.  EEG did show mild slowing but otherwise unremarkable.  Continues on Plavix and atorvastatin for secondary stroke prevention side effects. He does report that he had a new onset headache that started on Monday afternoon and lasted all day Tuesday. He received his COVID vaccine on Friday prior to the headache.  Located left temporal with pressure sensation relieved by Tylenol and rest.  He denies any visual changes, gait/balance impairments or any other associated symptoms. Blood pressure today 134/78.  Denies new or worsening stroke/TIA symptoms.  No further concerns at this time.  Update 03/06/2019 PS : [name] for follow-up after last visit 6 months ago with JJanett Billown[facility] practitioner.  [facility] the setting of significantly elevated blood pressure on 01/05/2019.  MRI scan of the brain was obtained which showed no acute abnormality.  EEG showed mild generalized slowing.  Patient disorientation confusion gradual return back to baseline in 2 to 3 weeks with the wife has noticed that his being quite forgetful and not able to remember recent information.  His short-term memory is poor.  He has times in fact demanding names of his neighbors.  He finds this frustrating.  However remains quite independent at home and is managing all his activities  of daily living.  He can be left alone for several hours.  He ambulates with a cane and his balance is poor.  Has had a  few falls but no major injuries.  He does have family history of dementia in his dad also after a stroke and worries about it.  He remains on Plavix which is tolerating well with minor bruising but no bleeding episodes.  His blood pressure is under good control today it is 141/60.  He states his sugars are also under good control.  Is tolerating Lipitor well without muscle aches and pains.   Virtual video visit 09/04/2018 :  David [name] is a 80 y.o. malewith underlying history of diabetes, hypertension, significant heart disease status post pacemaker placement and recent diagnosis of right frontal cortex posteriorly compatible with acute infarction on 08/30/17 after presenting with left sided weakness. He was initially scheduled for office follow up today but due to COVID-19 safety precautions, visit transitioned to telemedicine visit via WebEx. He has been doing well from a stroke standpoint with residual deficits of occasional balance difficulties. He will use a cane when out of the house.  No recent falls.   Continues on Plavix and atorvastatin without reported side effects.  Lipid panel 07/17/2018 LDL 62.  Blood pressure elevated at 184/84. Blood pressure has been elevated for the past 2 months per wife. He follows with nephrology with slowly increasing hydralazine with last increase approx 4 weeks prior.  Follow-up appointment with nephrology scheduled next week but rescheduled due to COVID-19 50 precautions.  Asymptomatic hypertension but does endorse increase fatigue over the past month.  Per wife, fatigue complaints extensively worked up by PCP which was unremarkable and felt fatigue likely related to medication use.  History of OSA and reported compliance with CPAP.  Glucose levels avg 160.  A1c 7.9 on 07/17/2018. He follows with endocrinology.  His wife endorses worsening short-term memory such as having a conversation or being told something and forgetting shortly after.  This is been present for  approximately the past month.  No further concerns at this time.  Denies new or worsening stroke/TIA symptoms.  Interval history 03/05/2018: Patient returns today for follow up appointment with his wife. Wife states he has been making improvements daily. He continues to use cane on occasion but more for "security" reasons. Completed PT/OT since previous visit but remains active with his gym exercising. Continues to take plavix without bleeding but mild bruising. Continues lipitor without side effects of myalgias with recent LDL 50. Patient has returned to driving without complications along with all other activities except for golfing due to intermittent balance issues which he believes is from arthritis in his knees and decreased stamina. Blood pressure today 184/67, asymptomatic.  Rechecked manual at 178/68. Monitors at home intermittently as it was previously stable. Glucose levels have been stable as he does monitor at home.  No further concerns at this time.  Denies new or worsening stroke/TIA symptoms.  10/31/2017 visit: Patient is being seen today for hospital follow-up and is accompanied by his wife.  Overall he continues to do well and continues to improve with PT/OT at the neuro rehab clinic [place].  He continues to take Plavix with mild bruising but no bleeding.  Continues to take Lipitor without side effects myalgias.  BP satisfactory 102/53.  He has returned to all previous activities except for driving as OT has stated that he is not quite there at multitasking at.  He states he feels a   very mild weakness in his left side and does use a cane for stability but he did use this prior to the stroke as well.  Denies new or worsening stroke/TIA symptoms.  Stroke admission 08/30/2017: [name] is a 80 y.o. male with history of diabetes, hypertension, significant heart disease status post pacemaker placement who presented with L sided weakness x 2 days.  CT head reviewed showed no acute  stroke.  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  Carotid Dopplers showed right ICA stenosis of 1 to 39% but study was limited in left carotid and ended due to patient's combativeness.  2D echo showed EF of 60 to 65%.  TEE was negative for thrombus and PFO.  Pacemaker interrogated which did not show evidence of A. fib.  LDL 6 as patient was previously on fish oil was recommended to start Lipitor 10 mg.  A1c 7.6 and recommended close PCP follow-up.  Recommended DAPT of aspirin and Plavix for 3 weeks followed by Plavix alone as patient was previously on aspirin 81 mg.  Therapy is recommended CIR for continued therapies.       ROS:   14 system review of systems performed and negative with exception of single episode of dyspnea on exertion, headache, fatigue and short term memory.  PMH:  Past Medical History:  Diagnosis Date  . Acute cholecystitis s/p lap cholecystectomy 03/05/2017 03/04/2017  . Anemia, iron deficiency   . Anxiety   . Arthritis   . Atrial fibrillation with rapid ventricular response (HCC)   . BPH (benign prostatic hypertrophy)   . CAD (coronary artery disease)    Nonobstructive CAD per cath  . Cardiac pacemaker in situ   . CHF (congestive heart failure) (HCC)   . Chronic ulcer of right foot (HCC)    resolved per patient  . CKD (chronic kidney disease) stage 4, GFR 15-29 ml/min (HCC) 09/26/2017  . CKD (chronic kidney disease), stage III (HCC) secondary to DM and HTN   nephrologist-  [name]  . Coronary artery disease involving native coronary artery of native heart without angina pectoris   . Depression   . Diastolic dysfunction   . Dyspnea    with exertion, no oxygen  . 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 Hearing loss    wears hearing aids  . History of cellulitis    right great toe 10-25-2014  . History of skin cancer   . HOCM  (hypertrophic obstructive cardiomyopathy) (HCC)   . Hyperlipidemia associated with type 2 diabetes mellitus (HCC) 10/25/2013  . Hypertension   . Hypertension associated with diabetes (HCC) 10/25/2013  . 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 Insulin dependent type 2 diabetes mellitus (HCC) 1991   followd by dr kumar--  has insulin pump  . Insulin pump in place    Patient does not have insulin pump - removed approx 2 yrs ago 2019  . Memory loss    mild per patient  . OSA on CPAP   . Pacemaker 06/03/2015   Medtronic  . Peripheral neuropathy    severe - feet/lower legs  . Peripheral vascular disease (HCC)    bilateral lower extremities  . PVD (peripheral vascular disease) (HCC)   . Rib fracture 07/24/2015  . Seasonal and perennial allergic rhinitis 10/25/2013  . Seborrheic keratosis 09/19/2016  . Secondary hyperparathyroidism of renal origin (HCC)   .   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 Sinus node dysfunction (HCC)   . Sleep apnea   . Syncope 07/24/2015  . Walker as ambulation aid     PSH:  Past Surgical History:  Procedure Laterality Date  . AMPUTATION OF REPLICATED TOES  Mar 2012   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)  . AMPUTATION TOE Right 03/12/2015   Procedure: RIGHT HALLUS AMPUTATION ;  Surgeon: N'Tuma Jah, MD;  Location: [facility];  Service: Podiatry;  Laterality: Right;  . AV FISTULA PLACEMENT Left 04/16/2020   Procedure: CREATION OF LEFT Brachial/Cephalic Arteriovenous Fistula.;  Surgeon: Hawken, Thomas N, MD;  Location: [facility] OR;  Service: Vascular;  Laterality: Left;  . CARDIAC CATHETERIZATION  11-25-2010   [place], [place]   Nonobstructive CAD  . CARDIAC PACEMAKER PLACEMENT  Nov 2009   Medtronic  . CHOLECYSTECTOMY N/A 03/05/2017   Procedure: LAPAROSCOPIC CHOLECYSTECTOMY WITH INTRAOPERATIVE CHOLANGIOGRAM;  Surgeon: Gross, Steven, MD;  Location: [facility];  Service: General;  Laterality: N/A;  . EP IMPLANTABLE DEVICE N/A 06/03/2015    Procedure: PPM Generator Changeout;  Surgeon: Steven C Klein, MD;  Location: [facility];  Service: Cardiovascular;  Laterality: N/A;  . EXCISION BONE CYST Right 03/06/2015   Procedure: BONE BIOPSIES OF RIGHT FOOT;  Surgeon: N'Tuma Jah, MD;  Location: [facility];  Service: Podiatry;  Laterality: Right;  . EYE SURGERY Bilateral   . KNEE ARTHROSCOPY    . ORIF ANKLE FRACTURE Left 11/06/2014   Procedure: OPEN REDUCTION INTERNAL FIXATION (ORIF) LEFT  ANKLE FRACTURE;  Surgeon: John Hewitt, MD;  Location: [facility];  Service: Orthopedics;  Laterality: Left;  . TEE WITHOUT CARDIOVERSION N/A 09/01/2017   Procedure: TRANSESOPHAGEAL ECHOCARDIOGRAM (TEE);  Surgeon: Ross, Paula V, MD;  Location: [facility];  Service: Cardiovascular;  Laterality: N/A;  . TOTAL KNEE ARTHROPLASTY     patient denies this surgery  . VEIN LIGATION AND STRIPPING      Social History:  Social History   Socioeconomic History  . Marital status: Married    Spouse name: Not on file  . Number of children: 3  . Years of education: 12  . Highest education level: Not on file  Occupational History  . Occupation: Retired  Tobacco Use  . Smoking status: Former Smoker    Packs/day: 2.00    Years: 30.00    Pack years: 60.00    Quit date: 03/03/1984    Years since quitting: 36.3  . Smokeless tobacco: Never Used  Vaping Use  . Vaping Use: Never used  Substance and Sexual Activity  . Alcohol use: Yes    Alcohol/week: 1.0 standard drink    Types: 1 Glasses of wine per week    Comment: social 1-2 times/month  . Drug use: No  . Sexual activity: Not Currently  Other Topics Concern  . Not on file  Social History Narrative   Currently resides with his wife. 1 dog. Fun/Hobby: Golf    Denies any religious beliefs effecting health care.    Social Determinants of Health   Financial Resource Strain: Not on file  Food Insecurity: Not on file  Transportation Needs: Not on file  Physical  Activity: Not on file  Stress: Not on file  Social Connections: Not on file  Intimate Partner Violence: Not on file    Family History:  Family History  Problem Relation Age of Onset  . Cancer Mother        breast  . Heart attack Father   .   Stroke Father     Medications:   Current Outpatient Medications on File Prior to Visit  Medication Sig Dispense Refill  . acetaminophen (TYLENOL) 650 MG CR tablet Take 650 mg by mouth every 8 (eight) hours as needed for pain.    Marland Kitchen amLODipine (NORVASC) 5 MG tablet TAKE 1 TABLET BY MOUTH EVERY DAY (Patient taking differently: Take 5 mg by mouth daily.) 90 tablet 1  . atorvastatin (LIPITOR) 10 MG tablet TAKE 1 TABLET BY MOUTH EVERY DAY AT 6PM (Patient taking differently: Take 10 mg by mouth at bedtime.) 90 tablet 1  . blood glucose meter kit and supplies KIT Dispense based on patient and insurance preference. Use up to four times daily as directed. (FOR ICD-9 250.00, 250.01). 1 each 0  . calcitRIOL (ROCALTROL) 0.25 MCG capsule Take 1 capsule by mouth once daily (Patient taking differently: Take 0.25 mcg by mouth daily.) 90 capsule 0  . carvedilol (COREG) 25 MG tablet TAKE 1 TABLET BY MOUTH TWICE DAILY WITH A MEAL (Patient taking differently: Take 25 mg by mouth 2 (two) times daily with a meal.) 180 tablet 0  . cholecalciferol (VITAMIN D) 1000 units tablet Take 1 tablet (1,000 Units total) by mouth daily. 30 tablet 1  . cholestyramine light (PREVALITE) 4 g packet Take 4 g by mouth daily.    . clopidogrel (PLAVIX) 75 MG tablet TAKE 1 TABLET BY MOUTH EVERY DAY (Patient taking differently: Take 75 mg by mouth daily.) 90 tablet 0  . folic acid (FOLVITE) 1 MG tablet Take 1 mg by mouth daily.    Marland Kitchen glucose blood test strip Use Contour test strips as instructed to check blood sugar four times daily. 200 each 2  . hydrALAZINE (APRESOLINE) 100 MG tablet TAKE 1 TABLET BY MOUTH TWICE A DAY 180 tablet 0  . insulin aspart (NOVOLOG FLEXPEN) 100 UNIT/ML FlexPen Inject  10-12 Units into the skin See admin instructions. Inject 10 units subcutaneously before breakfast and 12 units before supper 15 mL 5  . Insulin Syringe-Needle U-100 (INSULIN SYRINGE 1CC/31GX5/16") 31G X 5/16" 1 ML MISC 1 each by Does not apply route 3 (three) times daily. Use insulin syringe to inject insulin three times daily. 100 each 2  . isosorbide mononitrate (IMDUR) 30 MG 24 hr tablet TAKE 2 TABLETS DAILY. PLEASE MAKE OVERDUE APPT WITH DR. Caryl Comes BEFORE ANYMORE REFILLS. 1ST ATTEMPT (Patient taking differently: Take 60 mg by mouth daily.) 180 tablet 3  . liraglutide (VICTOZA) 18 MG/3ML SOPN Inject 1.8 mg into the skin daily before supper. 9 mL 1  . Omega-3 Fatty Acids (FISH OIL) 1000 MG CAPS Take 1 capsule (1,000 mg total) by mouth every morning. 30 capsule 1  . pantoprazole (PROTONIX) 40 MG tablet TAKE 1 TABLET BY MOUTH EVERY DAY (Patient taking differently: Take 40 mg by mouth daily.) 90 tablet 0  . PRESCRIPTION MEDICATION Place into the left eye See admin instructions. Injections at [facility] once every 4 weeks - left eye for diabetic retinopathy    . sertraline (ZOLOFT) 100 MG tablet TAKE 1 TABLET BY MOUTH EVERY DAY (Patient taking differently: Take 100 mg by mouth daily with supper.) 90 tablet 1  . sodium bicarbonate 650 MG tablet Take 2 tablets (1,300 mg total) by mouth 2 (two) times daily.    Marland Kitchen torsemide (DEMADEX) 20 MG tablet Take 1 tablet (20 mg total) by mouth 2 (two) times daily. Can take extra 58m daily for weight gain or increased swelling    . traMADol (ULTRAM) 50  MG tablet Take 1 tablet (50 mg total) by mouth every 12 (twelve) hours as needed for moderate pain. 10 tablet 0  . TRESIBA FLEXTOUCH 200 UNIT/ML FlexTouch Pen INJECT 44 UNITS SUBCUTANEOUSLY ONCE DAILY 9 mL 2  . vitamin B-12 (CYANOCOBALAMIN) 100 MCG tablet Take 100 mcg by mouth daily.     No current facility-administered medications on file prior to visit.    Allergies:   Allergies  Allergen Reactions  . Ativan  [Lorazepam] Anxiety and Other (See Comments)    Pt gets more agitated  . Adhesive [Tape] Other (See Comments)    blisters     Physical Exam  There were no vitals filed for this visit. There is no height or weight on file to calculate BMI. No exam data present  General: Obese pleasant elderly Caucasian male, seated, in no evident distress Head: head normocephalic and atraumatic.   Neck: supple with no carotid or supraclavicular bruits Cardiovascular: regular rate and rhythm, no murmurs Musculoskeletal: no deformity Skin:  BLE edema with erythema Vascular:  Normal pulses all extremities  Neurologic Exam Mental Status: Awake and fully alert. Fluent speech and language.  Recent memory impaired and remote memory intact. Attention span, concentration and fund of knowledge appropriate during visit. Mood and affect appropriate.  MMSE - Mini Mental State Exam 01/15/2020 06/26/2019 06/22/2017  Not completed: - (No Data) (No Data)  Orientation to time 3 4 -  Orientation to Place 4 3 -  Registration 3 3 -  Attention/ Calculation 3 1 -  Recall 2 2 -  Language- name 2 objects 2 2 -  Language- repeat 1 1 -  Language- follow 3 step command 3 3 -  Language- read & follow direction 1 1 -  Write a sentence 1 0 -  Copy design 1 1 -  Total score 24 21 -   Cranial Nerves: Pupils equal, briskly reactive to light. Extraocular movements full without nystagmus. Visual fields full to confrontation. Hard of hearin. Facial sensation intact. Face, tongue, palate moves normally and symmetrically.  Motor: Normal bulk and tone. Normal strength in all tested extremity muscles.  Sensory.:  Intact to touch , pinprick , position and vibratory sensation.  Coordination: Rapid alternating movements normal in all extremities. Finger-to-nose and heel-to-shin performed accurately bilaterally.   Gait and Station: Stands from seated position without difficulty.  Ambulates with rolling walker with mild gait impairment.   Tandem walk not attempted. Reflexes: 1+ and symmetric. Toes downgoing.       ASSESSMENT/PLAN: David [name] is a 79 y.o. year old male here with 2 small right frontal cortical infarcts on 08/30/2017 secondary to cryptogenic source. Vascular risk factors include DM, HTN and HLD. Patient had recent admission in August 2020 secondary to confusion and disorientation in setting of hypertensive urgency but now persistent mild short term memory impairment which has been stable.   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  Also highly encouraged increasing activity level due to his sedentary lifestyle since retirement, ensure healthy diet and adequate sleeping and importance of stroke risk factor management  Hx of stroke -Continue Plavix and Lipitor for stroke secondary stroke prevention  -Continue to follow PCP/cardiology/endocrinology for HTN, HLD and DM management -Maintain strict control of hypertension with blood pressure goal below 130/90, lipids with LDL cholesterol goal below 70 mg percent and diabetes with hemoglobin A1c goal   below 6.5%.   -Ongoing use of CPAP for OSA management -has follow-up scheduled this Friday for further discussion regarding increased daytime fatigue  Headache, intermittent, chronic -Based on symptoms, appears more tension related -No migrainous type features or red flag symptoms -No indication for further evaluation at this time or need of medication management    Follow-up in 6 months or call earlier if needed    CC:  [facility] provider: Dr. Sethi Parker, Caleb M, MD    I spent 30 minutes of face-to-face and non-face-to-face time with patient and wife.  This included previsit chart review, lab review, study review, order entry, electronic health record documentation, patient  education regarding mild cognitive impairment post stroke, history of prior strokes, importance of managing stroke risk factors, intermittent occasional headaches, answered all questions to patient and wife satisfaction     McCue, AGNP-BC  [facility] [address] [place], [place] [address]  Phone [phone] Fax [phone] Note: This document was prepared with digital dictation and possible smart phrase technology. Any transcriptional errors that result from this process are unintentional.    

## 2020-07-17 DIAGNOSIS — I130 Hypertensive heart and chronic kidney disease with heart failure and stage 1 through stage 4 chronic kidney disease, or unspecified chronic kidney disease: Secondary | ICD-10-CM | POA: Diagnosis not present

## 2020-07-17 DIAGNOSIS — I69354 Hemiplegia and hemiparesis following cerebral infarction affecting left non-dominant side: Secondary | ICD-10-CM | POA: Diagnosis not present

## 2020-07-17 DIAGNOSIS — N184 Chronic kidney disease, stage 4 (severe): Secondary | ICD-10-CM | POA: Diagnosis not present

## 2020-07-17 DIAGNOSIS — E1122 Type 2 diabetes mellitus with diabetic chronic kidney disease: Secondary | ICD-10-CM | POA: Diagnosis not present

## 2020-07-17 DIAGNOSIS — E1151 Type 2 diabetes mellitus with diabetic peripheral angiopathy without gangrene: Secondary | ICD-10-CM | POA: Diagnosis not present

## 2020-07-17 DIAGNOSIS — I509 Heart failure, unspecified: Secondary | ICD-10-CM | POA: Diagnosis not present

## 2020-07-21 ENCOUNTER — Ambulatory Visit (INDEPENDENT_AMBULATORY_CARE_PROVIDER_SITE_OTHER): Payer: Medicare Other | Admitting: Podiatry

## 2020-07-21 ENCOUNTER — Other Ambulatory Visit: Payer: Self-pay

## 2020-07-21 DIAGNOSIS — L84 Corns and callosities: Secondary | ICD-10-CM | POA: Diagnosis not present

## 2020-07-21 DIAGNOSIS — Z89411 Acquired absence of right great toe: Secondary | ICD-10-CM

## 2020-07-21 DIAGNOSIS — E1140 Type 2 diabetes mellitus with diabetic neuropathy, unspecified: Secondary | ICD-10-CM | POA: Diagnosis not present

## 2020-07-21 DIAGNOSIS — E1142 Type 2 diabetes mellitus with diabetic polyneuropathy: Secondary | ICD-10-CM

## 2020-07-21 NOTE — Progress Notes (Addendum)
The patient presented to the office today to pick up diabetic shoes and 2 pair diabetic custom inserts and a [id number] insert.  1 pair of inserts were put in the shoes and the shoes were fitted to the patient. The patient states they are comfortable and free of defect. He was satisfied with the fit of the shoe. Instructions for break in and wear were dispensed. The patient signed the delivery documentation and break in instruction form.  I stated to the patient if there is any concerns and or questions to call the office.   Dispensed one pair of extra depth shoes and 3 pairs of custom-molded tonal contact multidensity Plastazote/EVA inserts that were made from a positive model of the patient's foot. Custom molded inserts were required to achieve and maintain total contact with the plantar aspect of the patient's foot for the life of the device and to prevent tissue damage. Three pairs of inserts are required in this patient due to bottoming out and loss of protection after four months of wear. The shoes fit well and the inserts achieve total contact with the plantar surface of the patient's foot. A statement of certifying physician is on file in the patient's chart that documents the medical necessity for footwear and/or inserts. The patient was given a copy of the supplier standards, the return policy and new shoe break-in instructions. The patient was advised to wear the shoes at home for only one hour on the first day and to check their feet for any sores or irritations. The patient will call if any problems arise.

## 2020-07-23 ENCOUNTER — Ambulatory Visit (INDEPENDENT_AMBULATORY_CARE_PROVIDER_SITE_OTHER): Payer: Medicare Other

## 2020-07-23 VITALS — BP 139/61 | HR 65 | Wt 265.0 lb

## 2020-07-23 DIAGNOSIS — Z0000 Encounter for general adult medical examination without abnormal findings: Secondary | ICD-10-CM

## 2020-07-23 NOTE — Patient Instructions (Addendum)
[name] , Thank you for taking time to come for your Medicare Wellness Visit. I appreciate your ongoing commitment to your health goals. Please review the following plan we discussed and let me know if I can assist you in the future.   Screening recommendations/referrals: Colonoscopy: No longer required  Recommended yearly ophthalmology/optometry visit for glaucoma screening and checkup Recommended yearly dental visit for hygiene and checkup  Vaccinations: Influenza vaccine: Done 02/11/20 Pneumococcal vaccine: Due and discussed Tdap vaccine: due and discussed Shingles vaccine: Shingrix discussed. Please contact your pharmacy for coverage information.    Covid-19: 2/5, 3/2, & 02/22/20  Advanced directives: Please bring a copy of your health care power of attorney and living will to the office at your convenience.  Conditions/risks identified: None at this time  Next appointment: Follow up in one year for your annual wellness visit.   Preventive Care 37 Years and Older, Male Preventive care refers to lifestyle choices and visits with your health care provider that can promote health and wellness. What does preventive care include?  A yearly physical exam. This is also called an annual well check.  Dental exams once or twice a year.  Routine eye exams. Ask your health care provider how often you should have your eyes checked.  Personal lifestyle choices, including:  Daily care of your teeth and gums.  Regular physical activity.  Eating a healthy diet.  Avoiding tobacco and drug use.  Limiting alcohol use.  Practicing safe sex.  Taking low doses of aspirin every day.  Taking vitamin and mineral supplements as recommended by your health care provider. What happens during an annual well check? The services and screenings done by your health care provider during your annual well check will depend on your age, overall health, lifestyle risk factors, and family history of  disease. Counseling  Your health care provider may ask you questions about your:  Alcohol use.  Tobacco use.  Drug use.  Emotional well-being.  Home and relationship well-being.  Sexual activity.  Eating habits.  History of falls.  Memory and ability to understand (cognition).  Work and work Statistician. Screening  You may have the following tests or measurements:  Height, weight, and BMI.  Blood pressure.  Lipid and cholesterol levels. These may be checked every 5 years, or more frequently if you are over 75 years old.  Skin check.  Lung cancer screening. You may have this screening every year starting at age 57 if you have a 30-pack-year history of smoking and currently smoke or have quit within the past 15 years.  Fecal occult blood test (FOBT) of the stool. You may have this test every year starting at age 69.  Flexible sigmoidoscopy or colonoscopy. You may have a sigmoidoscopy every 5 years or a colonoscopy every 10 years starting at age 93.  Prostate cancer screening. Recommendations will vary depending on your family history and other risks.  Hepatitis C blood test.  Hepatitis B blood test.  Sexually transmitted disease (STD) testing.  Diabetes screening. This is done by checking your blood sugar (glucose) after you have not eaten for a while (fasting). You may have this done every 1-3 years.  Abdominal aortic aneurysm (AAA) screening. You may need this if you are a current or former smoker.  Osteoporosis. You may be screened starting at age 87 if you are at high risk. Talk with your health care provider about your test results, treatment options, and if necessary, the need for more tests. Vaccines  Your health care provider may recommend certain vaccines, such as:  Influenza vaccine. This is recommended every year.  Tetanus, diphtheria, and acellular pertussis (Tdap, Td) vaccine. You may need a Td booster every 10 years.  Zoster vaccine. You may  need this after age 54.  Pneumococcal 13-valent conjugate (PCV13) vaccine. One dose is recommended after age 45.  Pneumococcal polysaccharide (PPSV23) vaccine. One dose is recommended after age 101. Talk to your health care provider about which screenings and vaccines you need and how often you need them. This information is not intended to replace advice given to you by your health care provider. Make sure you discuss any questions you have with your health care provider. Document Released: 05/29/2015 Document Revised: 01/20/2016 Document Reviewed: 03/03/2015 Elsevier Interactive Patient Education  2017 [place] Prevention in the Home Falls can cause injuries. They can happen to people of all ages. There are many things you can do to make your home safe and to help prevent falls. What can I do on the outside of my home?  Regularly fix the edges of walkways and driveways and fix any cracks.  Remove anything that might make you trip as you walk through a door, such as a raised step or threshold.  Trim any bushes or trees on the path to your home.  Use bright outdoor lighting.  Clear any walking paths of anything that might make someone trip, such as rocks or tools.  Regularly check to see if handrails are loose or broken. Make sure that both sides of any steps have handrails.  Any raised decks and porches should have guardrails on the edges.  Have any leaves, snow, or ice cleared regularly.  Use sand or salt on walking paths during winter.  Clean up any spills in your garage right away. This includes oil or grease spills. What can I do in the bathroom?  Use night lights.  Install grab bars by the toilet and in the tub and shower. Do not use towel bars as grab bars.  Use non-skid mats or decals in the tub or shower.  If you need to sit down in the shower, use a plastic, non-slip stool.  Keep the floor dry. Clean up any water that spills on the floor as soon as it  happens.  Remove soap buildup in the tub or shower regularly.  Attach bath mats securely with double-sided non-slip rug tape.  Do not have throw rugs and other things on the floor that can make you trip. What can I do in the bedroom?  Use night lights.  Make sure that you have a light by your bed that is easy to reach.  Do not use any sheets or blankets that are too big for your bed. They should not hang down onto the floor.  Have a firm chair that has side arms. You can use this for support while you get dressed.  Do not have throw rugs and other things on the floor that can make you trip. What can I do in the kitchen?  Clean up any spills right away.  Avoid walking on wet floors.  Keep items that you use a lot in easy-to-reach places.  If you need to reach something above you, use a strong step stool that has a grab bar.  Keep electrical cords out of the way.  Do not use floor polish or wax that makes floors slippery. If you must use wax, use non-skid floor wax.  Do  not have throw rugs and other things on the floor that can make you trip. What can I do with my stairs?  Do not leave any items on the stairs.  Make sure that there are handrails on both sides of the stairs and use them. Fix handrails that are broken or loose. Make sure that handrails are as long as the stairways.  Check any carpeting to make sure that it is firmly attached to the stairs. Fix any carpet that is loose or worn.  Avoid having throw rugs at the top or bottom of the stairs. If you do have throw rugs, attach them to the floor with carpet tape.  Make sure that you have a light switch at the top of the stairs and the bottom of the stairs. If you do not have them, ask someone to add them for you. What else can I do to help prevent falls?  Wear shoes that:  Do not have high heels.  Have rubber bottoms.  Are comfortable and fit you well.  Are closed at the toe. Do not wear sandals.  If you  use a stepladder:  Make sure that it is fully opened. Do not climb a closed stepladder.  Make sure that both sides of the stepladder are locked into place.  Ask someone to hold it for you, if possible.  Clearly mark and make sure that you can see:  Any grab bars or handrails.  First and last steps.  Where the edge of each step is.  Use tools that help you move around (mobility aids) if they are needed. These include:  Canes.  Walkers.  Scooters.  Crutches.  Turn on the lights when you go into a dark area. Replace any light bulbs as soon as they burn out.  Set up your furniture so you have a clear path. Avoid moving your furniture around.  If any of your floors are uneven, fix them.  If there are any pets around you, be aware of where they are.  Review your medicines with your doctor. Some medicines can make you feel dizzy. This can increase your chance of falling. Ask your doctor what other things that you can do to help prevent falls. This information is not intended to replace advice given to you by your health care provider. Make sure you discuss any questions you have with your health care provider. Document Released: 02/26/2009 Document Revised: 10/08/2015 Document Reviewed: 06/06/2014 Elsevier Interactive Patient Education  2017 [employer].

## 2020-07-23 NOTE — Progress Notes (Signed)
Virtual Visit via Telephone Note  I connected with  David [name] on 07/23/20 at  1:00 PM EST by telephone and verified that I am speaking with the correct person using two identifiers.  Medicare Annual Wellness visit completed telephonically due to Covid-19 pandemic.   Persons participating in this call: This Health Coach and this patient and wife David [name]  Location: Patient: Home Provider: Office    I discussed the limitations, risks, security and privacy concerns of performing an evaluation and management service by telephone and the availability of in person appointments. The patient expressed understanding and agreed to proceed.  Unable to perform video visit due to video visit attempted and failed and/or patient does not have video capability.   Some vital signs may be absent or patient reported.   Willette Brace, LPN    Subjective:   David [name] is a 80 y.o. male who presents for Medicare Annual/Subsequent preventive examination.  Review of Systems     Cardiac Risk Factors include: advanced age (>4mn, >>80women);male gender;dyslipidemia;hypertension;obesity (BMI >30kg/m2)     Objective:    Today's Vitals   07/23/20 1259  BP: 139/61  Pulse: 65  SpO2: 97%  Weight: 265 lb (120.2 kg)   Body mass index is 32.26 kg/m.  Advanced Directives 07/23/2020 04/20/2020 03/16/2020 08/16/2019 07/11/2019 05/13/2019 01/14/2019  Does Patient Have a Medical Advance Directive? Yes No _0   Type of [place]of [place] will [place] will [place] will [place] will Living will;Healthcare Power of Attorney  Does patient want to make changes to medical advance directive? - - No - Patient declined No - Patient declined No - Patient declined No - Patient declined No - Patient declined  Copy of [place] Chart?  No - copy requested - No - copy requested No - copy requested No - copy requested No - copy requested -  Would patient like information on creating a medical advance directive? - Yes (ED - Information included in AVS) - - - - -    Current Medications (verified) Outpatient Encounter Medications as of 07/23/2020  Medication Sig   acetaminophen (TYLENOL) 650 MG CR tablet Take 650 mg by mouth every 8 (eight) hours as needed for pain.   amLODipine (NORVASC) 5 MG tablet TAKE 1 TABLET BY MOUTH EVERY DAY (Patient taking differently: Take 5 mg by mouth daily.)   atorvastatin (LIPITOR) 10 MG tablet TAKE 1 TABLET BY MOUTH EVERY DAY AT 6PM (Patient taking differently: Take 10 mg by mouth at bedtime.)   blood glucose meter kit and supplies KIT Dispense based on patient and insurance preference. Use up to four times daily as directed. (FOR ICD-9 250.00, 250.01).   calcitRIOL (ROCALTROL) 0.25 MCG capsule Take 1 capsule by mouth once daily (Patient taking differently: Take 0.25 mcg by mouth daily.)   carvedilol (COREG) 25 MG tablet TAKE 1 TABLET BY MOUTH TWICE DAILY WITH A MEAL (Patient taking differently: Take 25 mg by mouth 2 (two) times daily with a meal.)   cholecalciferol (VITAMIN D) 1000 units tablet Take 1 tablet (1,000 Units total) by mouth daily.   clopidogrel (PLAVIX) 75 MG tablet TAKE 1 TABLET BY MOUTH EVERY DAY (Patient taking differently: Take 75 mg by mouth daily.)   folic acid (FOLVITE) 1 MG tablet Take 1 mg by mouth daily.   glucose blood test strip Use Contour test strips  as instructed to check blood sugar four times daily.   hydrALAZINE (APRESOLINE) 100 MG tablet TAKE 1 TABLET BY MOUTH TWICE A DAY   insulin aspart (NOVOLOG FLEXPEN) 100 UNIT/ML FlexPen Inject 10-12 Units into the skin See admin instructions. Inject 10 units subcutaneously before breakfast and 12 units before supper   Insulin Syringe-Needle U-100 (INSULIN SYRINGE 1CC/31GX5/16") 31G X 5/16" 1 ML MISC 1 each by Does not  apply route 3 (three) times daily. Use insulin syringe to inject insulin three times daily.   isosorbide mononitrate (IMDUR) 30 MG 24 hr tablet TAKE 2 TABLETS DAILY. PLEASE MAKE OVERDUE APPT WITH DR. Caryl Comes BEFORE ANYMORE REFILLS. 1ST ATTEMPT (Patient taking differently: Take 60 mg by mouth daily.)   liraglutide (VICTOZA) 18 MG/3ML SOPN Inject 1.8 mg into the skin daily before supper.   Omega-3 Fatty Acids (FISH OIL) 1000 MG CAPS Take 1 capsule (1,000 mg total) by mouth every morning.   pantoprazole (PROTONIX) 40 MG tablet TAKE 1 TABLET BY MOUTH EVERY DAY (Patient taking differently: Take 40 mg by mouth daily.)   PRESCRIPTION MEDICATION Place into the left eye See admin instructions. Injections at [facility] once every 4 weeks - left eye for diabetic retinopathy   sertraline (ZOLOFT) 100 MG tablet TAKE 1 TABLET BY MOUTH EVERY DAY (Patient taking differently: Take 100 mg by mouth daily with supper.)   sodium bicarbonate 650 MG tablet Take 2 tablets (1,300 mg total) by mouth 2 (two) times daily.   torsemide (DEMADEX) 20 MG tablet Take 1 tablet (20 mg total) by mouth 2 (two) times daily. Can take extra 66m daily for weight gain or increased swelling   TRESIBA FLEXTOUCH 200 UNIT/ML FlexTouch Pen INJECT 44 UNITS SUBCUTANEOUSLY ONCE DAILY   vitamin B-12 (CYANOCOBALAMIN) 100 MCG tablet Take 100 mcg by mouth daily.   [DISCONTINUED] cholestyramine light (PREVALITE) 4 g packet Take 4 g by mouth daily. (Patient not taking: Reported on 07/23/2020)   [DISCONTINUED] traMADol (ULTRAM) 50 MG tablet Take 1 tablet (50 mg total) by mouth every 12 (twelve) hours as needed for moderate pain. (Patient not taking: Reported on 07/23/2020)   No facility-administered encounter medications on file as of 07/23/2020.    Allergies (verified) Ativan [lorazepam] and Adhesive [tape]   History: Past Medical History:  Diagnosis Date   Acute cholecystitis s/p lap cholecystectomy 03/05/2017 03/04/2017   Anemia,  iron deficiency    Anxiety    Arthritis    Atrial fibrillation with rapid ventricular response (HCC)    BPH (benign prostatic hypertrophy)    CAD (coronary artery disease)    Nonobstructive CAD per cath   Cardiac pacemaker in situ    CHF (congestive heart failure) (HCC)    Chronic ulcer of right foot ([place]    resolved per patient   CKD (chronic kidney disease) stage 4, GFR 15-29 ml/min ([place] 09/26/2017   CKD (chronic kidney disease), stage III (HCC) secondary to DM and HTN   nephrologist-  [name]   Coronary artery disease involving native coronary artery of native heart without angina pectoris    Depression    Diastolic dysfunction    Dyspnea    with exertion, no oxygen   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 ([place] 09/04/2017   Hearing loss    wears hearing aids   History of cellulitis    right great toe 10-25-2014   History of skin cancer    HOCM (hypertrophic obstructive cardiomyopathy) (HCC)    Hyperlipidemia associated with type 2  diabetes mellitus ([place]) 10/25/2013   Hypertension    Hypertension associated with diabetes ([place]) 10/25/2013   IDDM (insulin dependent diabetes mellitus) - on insulin pump 03/04/2017   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   followd by [name]--  has insulin pump   Insulin pump in place    Patient does not have insulin pump - removed approx 2 yrs ago 2019   Memory loss    mild per patient   OSA on CPAP    Pacemaker 06/03/2015   Medtronic   Peripheral neuropathy    severe - feet/lower legs   Peripheral vascular disease (HCC)    bilateral lower extremities   PVD (peripheral vascular disease) ([place])    Rib fracture 07/24/2015   Seasonal and perennial allergic rhinitis 10/25/2013   Seborrheic keratosis 09/19/2016   Secondary hyperparathyroidism of renal origin ([place])    Sinus node arrhythmia 06/03/2015   Sinus node dysfunction ([place])    Sleep apnea    Syncope  07/24/2015   Walker as ambulation aid    Past Surgical History:  Procedure Laterality Date   AMPUTATION OF REPLICATED TOES  Mar 0258   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)   AMPUTATION TOE Right 03/12/2015   Procedure: RIGHT HALLUS AMPUTATION ;  Surgeon: [name], MD;  Location: [place];  Service: Podiatry;  Laterality: Right;   AV FISTULA PLACEMENT Left 04/16/2020   Procedure: CREATION OF LEFT Brachial/Cephalic Arteriovenous Fistula.;  Surgeon: Cherre Robins, MD;  Location: [place];  Service: Vascular;  Laterality: Left;   CARDIAC CATHETERIZATION  11-25-2010   [place], [place]   Nonobstructive CAD   CARDIAC PACEMAKER PLACEMENT  Nov 2009   Medtronic   CHOLECYSTECTOMY N/A 03/05/2017   Procedure: LAPAROSCOPIC CHOLECYSTECTOMY WITH INTRAOPERATIVE CHOLANGIOGRAM;  Surgeon: [name], MD;  Location[facility];  Service: General;  Laterality: N/A;   EP IMPLANTABLE DEVICE N/A 06/03/2015   Procedure: PPM Generator Changeout;  Surgeon: [name], MD;  Location: [place] [facility];  Service: Cardiovascular;  Laterality: N/A;   EXCISION BONE CYST Right 03/06/2015   Procedure: BONE BIOPSIES OF RIGHT FOOT;  Surgeon: [name], MD;  Location: [place];  Service: Podiatry;  Laterality: Right;   EYE SURGERY Bilateral    KNEE ARTHROSCOPY     ORIF ANKLE FRACTURE Left 11/06/2014   Procedure: OPEN REDUCTION INTERNAL FIXATION (ORIF) LEFT  ANKLE FRACTURE;  Surgeon: [name], MD;  Location: [place];  Service: Orthopedics;  Laterality: Left;   TEE WITHOUT CARDIOVERSION N/A 09/01/2017   Procedure: TRANSESOPHAGEAL ECHOCARDIOGRAM (TEE);  Surgeon: [name], MD;  Location: [facility]OSCOPY;  Service: Cardiovascular;  Laterality: N/A;   TOTAL KNEE ARTHROPLASTY     patient denies this surgery   VEIN LIGATION AND STRIPPING     Family History  Problem Relation Age of Onset   Cancer Mother        breast   Heart attack Father    Stroke  Father    Social History   Socioeconomic History   Marital status: Married    Spouse name: Not on file   Number of children: 3   Years of education: 12   Highest education level: Not on file  Occupational History   Occupation: Retired  Tobacco Use   Smoking status: Former Smoker    Packs/day: 2.00    Years: 30.00    Pack years: 60.00    Quit date: 03/03/1984    Years since quitting:  36.4   Smokeless tobacco: Never Used  Vaping Use   Vaping Use: Never used  Substance and Sexual Activity   Alcohol use: Yes    Alcohol/week: 1.0 standard drink    Types: 1 Glasses of wine per week    Comment: social 1-2 times/month   Drug use: No   Sexual activity: Not Currently  Other Topics Concern   Not on file  Social History Narrative   Currently resides with his wife. 1 dog. Fun/Hobby: Golf    Denies any religious beliefs effecting health care.    Social Determinants of Health   Financial Resource Strain: Low Risk    Difficulty of Paying Living Expenses: Not hard at all  Food Insecurity: No Food Insecurity   Worried About Charity fundraiser in the Last Year: Never true   [place] in the Last Year: Never true  Transportation Needs: No Transportation Needs   Lack of Transportation (Medical): No   Lack of Transportation (Non-Medical): No  Physical Activity: Insufficiently Active   Days of Exercise per Week: 3 days   Minutes of Exercise per Session: 20 min  Stress: No Stress Concern Present   Feeling of Stress : Not at all  Social Connections: Socially Isolated   Frequency of Communication with Friends and Family: Never   Frequency of Social Gatherings with Friends and Family: Once a week   Attends Religious Services: Never   Marine scientist or Organizations: No   Attends Music therapist: Never   Marital Status: Married    Tobacco Counseling Counseling given: Not Answered   Clinical Intake:  Pre-visit preparation  completed: Yes  Pain : No/denies pain     BMI - recorded: 32.26 Nutritional Status: BMI > 30  Obese Nutritional Risks: None Diabetes: Yes CBG done?: Yes (285) CBG resulted in Enter/ Edit results?: No Did pt. bring in CBG monitor from home?: No  How often do you need to have someone help you when you read instructions, pamphlets, or other written materials from your doctor or pharmacy?: 1 - Never  Diabetic?Nutrition Risk Assessment:  Has the patient had any N/V/D within the last 2 months?  No  Does the patient have any non-healing wounds?  Yes  left toe  Has the patient had any unintentional weight loss or weight gain?  No   Diabetes:  Is the patient diabetic?  Yes  If diabetic, was a CBG obtained today?  Yes  Did the patient bring in their glucometer from home?  No  How often do you monitor your CBG's? Daily .   Financial Strains and Diabetes Management:  Are you having any financial strains with the device, your supplies or your medication? No .  Does the patient want to be seen by Chronic Care Management for management of their diabetes?  No  Would the patient like to be referred to a Nutritionist or for Diabetic Management?  No   Diabetic Exams:  Diabetic Eye Exam: Overdue for diabetic eye exam. Pt has been advised about the importance in completing this exam. Patient advised to call and schedule an eye exam. Diabetic Foot Exam: Completed 12/31/19   Interpreter Needed?: No  Information entered by :: Charlott Rakes, LPN   Activities of Daily Living In your present state of health, do you have any difficulty performing the following activities: 07/23/2020 04/20/2020  Hearing? Y N  Comment wears hearing aids -  Vision? N N  Difficulty concentrating or making decisions?  N N  Walking or climbing stairs? Y Y  Comment don't do stairs -  Dressing or bathing? N N  Doing errands, shopping? N N  Preparing Food and eating ? N -  Using the Toilet? N -  In the past six  months, have you accidently leaked urine? Y -  Do you have problems with loss of bowel control? Y -  Comment weras briefs -  Managing your Medications? N -  Managing your Finances? N -  Housekeeping or managing your Housekeeping? N -  Some recent data might be hidden    Patient Care Team: [name], MD as PCP - General (Family Medicine) Sueanne Margarita, MD as PCP - Sleep Medicine (Cardiology) [name], MD as Consulting Physician (Nephrology) Elayne Snare, MD as Consulting Physician (Endocrinology) [name], MD as Consulting Physician (Cardiology) Marzetta Board, DPM as Consulting Physician (Podiatry) [place], [place], [place] as Consulting Physician (Optometry) Frann Rider, NP as Nurse Practitioner (Neurology) Madelin Rear, [facility] as Pharmacist (Pharmacist)  Indicate any recent Medical Services you may have received from other than [facility] providers in the past year (date may be approximate).     Assessment:   This is a routine wellness examination for [place].  Hearing/Vision screen  Hearing Screening   125Hz 250Hz 500Hz 1000Hz 2000Hz 3000Hz 4000Hz 6000Hz 8000Hz  Right ear:           Left ear:           Comments: Pt wears hearing aids   Vision Screening Comments: Pt follows up with [place] [facility] are and retina specialist   Dietary issues and exercise activities discussed: Current Exercise Habits: Home exercise routine, Type of exercise: stretching, Time (Minutes): 20, Frequency (Times/Week): 3, Weekly Exercise (Minutes/Week): 60  Goals     ([facility]) Eat Healthy     Follow Up Date 10/13/2020   - fill half of plate with vegetables - limit fast food meals to no more than 1 per week - manage portion size - set a realistic goal    Why is this important?   When you are ready to manage your nutrition or weight, having a plan and setting goals will help.  Taking small steps to change how you eat and exercise is a good place to start.    Notes:      [facility]) Learn More  About My Health     Follow Up Date 07/13/20   - tell my story and reason for my visit - make a list of questions - ask questions - repeat what I heard to make sure I understand - bring a list of my medicines to the visit - speak up when I don't understand    Why is this important?   The best way to learn about your health and care is by talking to the doctor and nurse.  They will answer your questions and give you information in the way that you like best.    Notes:      [facility]) Make and Keep All Appointments     Follow Up Date 10/13/20   - ask family or friend for a ride - call to cancel if needed - keep a calendar with appointment dates    Why is this important?   Part of staying healthy is seeing the doctor for follow-up care.  If you forget your appointments, there are some things you can do to stay on track.    Notes:      (  [facility]) Monitor and Manage My Blood Sugar     Follow Up Date 10/13/20   - check blood sugar at prescribed times - check blood sugar if I feel it is too high or too low - enter blood sugar readings and medication or insulin into daily log - take the blood sugar log to all doctor visits - take the blood sugar meter to all doctor visits -wife to assist patient to remember to take meal coverage Novolog with all meals and snacks -wife to assist reminding patient to check post prandil blood sugars and to keep log to show provider    Why is this important?   Checking your blood sugar at home helps to keep it from getting very high or very low.  Writing the results in a diary or log helps the doctor know how to care for you.  Your blood sugar log should have the time, date and the results.  Also, write down the amount of insulin or other medicine that you take.  Other information, like what you ate, exercise done and how you were feeling, will also be helpful.     Notes:      [facility]) Set My Target A1C     Follow Up Date 10/13/20   - set target A1C; goal  7, current 6.9    Why is this important?   Your target A1C is decided together by you and your doctor.  It is based on several things like your age and other health issues.    Notes:      Patient Stated     Commit to doing something you enjoy Goals to join a health club where you ease back into some form of exercise  Plan a trip to the mountain for enjoyment      Patient Stated     Will go over to the [place] and [employer] to inquire about volunteering or part time      Patient Stated     None at this time     Walk 3x/week 75mn each time.     Weight (lb) < 275 lb (124.7 kg)      Depression Screen PHQ 2/9 Scores 07/23/2020 07/11/2019 05/13/2019 04/03/2019 01/18/2019 01/14/2019 11/19/2018  PHQ - 2 Score 0 0 - 0 1 1 0  PHQ- 9 Score - - - - - - -  Exception Documentation - - Other- indicate reason in comment box - - Other- indicate reason in comment box -  Not completed - - did not speak with patient, spoke with wife - - Spouse states patrient is being worked up for dementia and has memory issues -    Fall Risk Fall Risk  07/23/2020 03/16/2020 12/19/2019 09/09/2019 07/11/2019  Falls in the past year? 0 _0 Comment - - - - -  Number falls in past yr: 0 _1 Comment - last fall december 2020 last fall December 2020 Last fall December 2020 -  Injury with Fall? 0 0 0 0 0  Comment - - - - minor  Risk for fall due to : Impaired vision;Impaired mobility;Impaired balance/gait History of fall(s);Impaired balance/gait;Impaired mobility;Impaired vision;Medication side effect History of fall(s);Impaired balance/gait;Impaired mobility;Impaired vision;Medication side effect History of fall(s);Impaired balance/gait;Impaired mobility;Impaired vision;Medication side effect History of fall(s);Impaired balance/gait;Impaired mobility  Risk for fall due to: Comment - - - - -  Follow up Falls prevention discussed Falls evaluation completed;Education provided;Falls prevention discussed Falls  evaluation completed;Education provided;Falls  prevention discussed Falls evaluation completed;Education provided;Falls prevention discussed Education provided;Falls prevention discussed;Falls evaluation completed    FALL RISK PREVENTION PERTAINING TO THE HOME:  Any stairs in or around the home? Yes  If so, are there any without handrails? No  Home free of loose throw rugs in walkways, pet beds, electrical cords, etc? Yes  Adequate lighting in your home to reduce risk of falls? Yes   ASSISTIVE DEVICES UTILIZED TO PREVENT FALLS:  Life alert? No  Use of a cane, walker or w/c? Yes  Grab bars in the bathroom? Yes  Shower chair or bench in shower? Yes  Elevated toilet seat or a handicapped toilet? Yes   TIMED UP AND GO:  Was the test performed? No    Cognitive Function: MMSE - Mini Mental State Exam 01/15/2020 06/26/2019 06/22/2017 05/18/2016  Not completed: - (No Data) (No Data) -  Orientation to time 3 4 - 5  Orientation to Place 4 3 - 5  Registration 3 3 - 3  Attention/ Calculation 3 1 - 5  Recall 2 2 - 3  Language- name 2 objects 2 2 - 2  Language- repeat 1 1 - 1  Language- follow 3 step command 3 3 - 3  Language- read & follow direction 1 1 - 1  Write a sentence 1 0 - 1  Copy design 1 1 - 1  Total score 24 21 - 30     6CIT Screen 07/23/2020 07/11/2019  What Year? 0 points 0 points  What month? 0 points 0 points  What time? - 0 points  Count back from 20 0 points 0 points  Months in reverse 4 points 2 points  Repeat phrase 10 points 2 points  Total Score - 4    Immunizations Immunization History  Administered Date(s) Administered   Fluad Quad(high Dose 65+) 03/05/2019   Influenza, High Dose Seasonal PF 02/09/2017   Influenza,inj,Quad PF,6+ Mos 01/30/2014   Influenza-Unspecified 03/17/2015, 04/15/2016, 01/14/2018, 02/11/2020   PFIZER(Purple Top)SARS-COV-2 Vaccination 06/21/2019, 07/16/2019, 02/22/2020    TDAP status: Due, Education has been provided regarding the  importance of this vaccine. Advised may receive this vaccine at local pharmacy or Health Dept. Aware to provide a copy of the vaccination record if obtained from local pharmacy or Health Dept. Verbalized acceptance and understanding.  Flu Vaccine status: Up to date  Pneumococcal vaccine status: Due, Education has been provided regarding the importance of this vaccine. Advised may receive this vaccine at local pharmacy or Health Dept. Aware to provide a copy of the vaccination record if obtained from local pharmacy or Health Dept. Verbalized acceptance and understanding.  Covid-19 vaccine status: Completed vaccines  Qualifies for Shingles Vaccine? Yes   Zostavax completed No   Shingrix Completed?: Yes  Screening Tests Health Maintenance  Topic Date Due   TETANUS/TDAP  Never done   PNA vac Low Risk Adult (1 of 2 - PCV13) Never done   FOOT EXAM  12/30/2020   OPHTHALMOLOGY EXAM  07/13/2021   INFLUENZA VACCINE  Completed   COVID-19 Vaccine  Completed   Hepatitis C Screening  Completed   HPV VACCINES  Aged Out    Health Maintenance  Health Maintenance Due  Topic Date Due   TETANUS/TDAP  Never done   PNA vac Low Risk Adult (1 of 2 - PCV13) Never done    Colorectal cancer screening: No longer required.   Additional Screening:  Hepatitis C Screening:  Completed 10/31/13  Vision Screening: Recommended annual ophthalmology exams for early  detection of glaucoma and other disorders of the eye. Is the patient up to date with their annual eye exam?  Yes  Who is the provider or what is the name of the office in which the patient attends annual eye exams? [facility]  If pt is not established with a provider, would they like to be referred to a provider to establish care? No .   Dental Screening: Recommended annual dental exams for proper oral hygiene  Community Resource Referral / Chronic Care Management: CRR required this visit?  No   CCM required this visit?  No       Plan:     I have personally reviewed and noted the following in the patients chart:    Medical and social history  Use of alcohol, tobacco or illicit drugs   Current medications and supplements  Functional ability and status  Nutritional status  Physical activity  Advanced directives  List of other physicians  Hospitalizations, surgeries, and ER visits in previous 12 months  Vitals  Screenings to include cognitive, depression, and falls  Referrals and appointments  In addition, I have reviewed and discussed with patient certain preventive protocols, quality metrics, and best practice recommendations. A written personalized care plan for preventive services as well as general preventive health recommendations were provided to patient.     Willette Brace, LPN   08/27/2438   Nurse Notes: None

## 2020-07-27 ENCOUNTER — Ambulatory Visit (HOSPITAL_COMMUNITY)
Admission: RE | Admit: 2020-07-27 | Discharge: 2020-07-27 | Disposition: A | Discharge status: Home / Self Care | Payer: Medicare Other | Source: Ambulatory Visit | Attending: Surgery | Admitting: Surgery

## 2020-07-27 ENCOUNTER — Other Ambulatory Visit: Payer: Self-pay

## 2020-07-27 ENCOUNTER — Ambulatory Visit (INDEPENDENT_AMBULATORY_CARE_PROVIDER_SITE_OTHER): Payer: Medicare Other | Admitting: Physician Assistant

## 2020-07-27 VITALS — BP 143/56 | HR 74 | Temp 98.0°F | Resp 20 | Ht 76.0 in | Wt 269.7 lb

## 2020-07-27 DIAGNOSIS — N184 Chronic kidney disease, stage 4 (severe): Secondary | ICD-10-CM

## 2020-07-27 NOTE — H&P (View-Only) (Signed)
POST OPERATIVE OFFICE NOTE    CC:  F/u for surgery  HPI:  This is a 80 y.o. male who is s/p left BC fistula creation   on 04/16/20 by Dr. Stanford Breed.  He was last seen on 05/20/20.  The fistula is very tortuous, but it has matured well and is not to deep for access.  He has returned today for re examination to determine if the fistula should be revised.    Pt returns today for follow up.  Pt states he has no pain, weakness or loss of sensation.  Allergies  Allergen Reactions  . Ativan [Lorazepam] Anxiety and Other (See Comments)    Pt gets more agitated  . Adhesive [Tape] Other (See Comments)    blisters    Current Outpatient Medications  Medication Sig Dispense Refill  . acetaminophen (TYLENOL) 650 MG CR tablet Take 650 mg by mouth every 8 (eight) hours as needed for pain.    Marland Kitchen amLODipine (NORVASC) 5 MG tablet TAKE 1 TABLET BY MOUTH EVERY DAY (Patient taking differently: Take 5 mg by mouth daily.) 90 tablet 1  . atorvastatin (LIPITOR) 10 MG tablet TAKE 1 TABLET BY MOUTH EVERY DAY AT 6PM (Patient taking differently: Take 10 mg by mouth at bedtime.) 90 tablet 1  . blood glucose meter kit and supplies KIT Dispense based on patient and insurance preference. Use up to four times daily as directed. (FOR ICD-9 250.00, 250.01). 1 each 0  . calcitRIOL (ROCALTROL) 0.25 MCG capsule Take 1 capsule by mouth once daily (Patient taking differently: Take 0.25 mcg by mouth daily.) 90 capsule 0  . carvedilol (COREG) 25 MG tablet TAKE 1 TABLET BY MOUTH TWICE DAILY WITH A MEAL (Patient taking differently: Take 25 mg by mouth 2 (two) times daily with a meal.) 180 tablet 0  . cholecalciferol (VITAMIN D) 1000 units tablet Take 1 tablet (1,000 Units total) by mouth daily. 30 tablet 1  . clopidogrel (PLAVIX) 75 MG tablet TAKE 1 TABLET BY MOUTH EVERY DAY (Patient taking differently: Take 75 mg by mouth daily.) 90 tablet 0  . folic acid (FOLVITE) 1 MG tablet Take 1 mg by mouth daily.    Marland Kitchen glucose blood test strip Use  Contour test strips as instructed to check blood sugar four times daily. 200 each 2  . hydrALAZINE (APRESOLINE) 100 MG tablet TAKE 1 TABLET BY MOUTH TWICE A DAY 180 tablet 0  . insulin aspart (NOVOLOG FLEXPEN) 100 UNIT/ML FlexPen Inject 10-12 Units into the skin See admin instructions. Inject 10 units subcutaneously before breakfast and 12 units before supper 15 mL 5  . Insulin Syringe-Needle U-100 (INSULIN SYRINGE 1CC/31GX5/16") 31G X 5/16" 1 ML MISC 1 each by Does not apply route 3 (three) times daily. Use insulin syringe to inject insulin three times daily. 100 each 2  . isosorbide mononitrate (IMDUR) 30 MG 24 hr tablet TAKE 2 TABLETS DAILY. PLEASE MAKE OVERDUE APPT WITH DR. Caryl Comes BEFORE ANYMORE REFILLS. 1ST ATTEMPT (Patient taking differently: Take 60 mg by mouth daily.) 180 tablet 3  . liraglutide (VICTOZA) 18 MG/3ML SOPN Inject 1.8 mg into the skin daily before supper. 9 mL 1  . Omega-3 Fatty Acids (FISH OIL) 1000 MG CAPS Take 1 capsule (1,000 mg total) by mouth every morning. 30 capsule 1  . pantoprazole (PROTONIX) 40 MG tablet TAKE 1 TABLET BY MOUTH EVERY DAY (Patient taking differently: Take 40 mg by mouth daily.) 90 tablet 0  . PRESCRIPTION MEDICATION Place into the left eye See admin instructions.  Injections at [facility] once every 4 weeks - left eye for diabetic retinopathy    . sertraline (ZOLOFT) 100 MG tablet TAKE 1 TABLET BY MOUTH EVERY DAY (Patient taking differently: Take 100 mg by mouth daily with supper.) 90 tablet 1  . sodium bicarbonate 650 MG tablet Take 2 tablets (1,300 mg total) by mouth 2 (two) times daily.    Marland Kitchen torsemide (DEMADEX) 20 MG tablet Take 1 tablet (20 mg total) by mouth 2 (two) times daily. Can take extra 21m daily for weight gain or increased swelling    . TRESIBA FLEXTOUCH 200 UNIT/ML FlexTouch Pen INJECT 44 UNITS SUBCUTANEOUSLY ONCE DAILY 9 mL 2  . vitamin B-12 (CYANOCOBALAMIN) 100 MCG tablet Take 100 mcg by mouth daily.     No current  facility-administered medications for this visit.     ROS:  See HPI  Physical Exam:    Findings:  +--------------------+----------+-----------------+--------+  AVF         PSV (cm/s)Flow Vol (mL/min)Comments  +--------------------+----------+-----------------+--------+  Native artery inflow  340      490          +--------------------+----------+-----------------+--------+  AVF Anastomosis     424                 +--------------------+----------+-----------------+--------+     +------------+----------+-------------+----------+--------+  OUTFLOW VEINPSV (cm/s)Diameter (cm)Depth (cm)Describe  +------------+----------+-------------+----------+--------+  Shoulder    117    0.62     0.55  tortuous  +------------+----------+-------------+----------+--------+  Prox UA     65    0.76     0.57  tortuous  +------------+----------+-------------+----------+--------+  Mid UA     122    0.63     0.41  tortuous  +------------+----------+-------------+----------+--------+  Dist UA     149    0.65     0.26  tortuous  +------------+----------+-------------+----------+--------+  AC Fossa    304    0.64     0.30        +------------+----------+-------------+----------+--------+        Summary:  Patent arteriovenous fistula.   Incision:  Well healed Extremities:  Grip 5/5, palpable thrill and radial pulse    Assessment/Plan:  This is a 80y.o. male who is s/p: Left BC fistula creation  The fistula is very tortuous the size in diameter is goo> 0.6 and the depth< 0.6.  The fistula would be hard to access due to the tortuosity in it.  I will schedule him for revision/ligation of branches and possible transposition.  He is currently not on HD.     ERoxy HorsemanPA-C [facility] [phone]  Clinic MD:   BTrula Slade

## 2020-07-27 NOTE — Progress Notes (Signed)
POST OPERATIVE OFFICE NOTE    CC:  F/u for surgery  HPI:  This is a 80 y.o. male who is s/p left BC fistula creation   on 04/16/20 by Dr. Stanford Breed.  He was last seen on 05/20/20.  The fistula is very tortuous, but it has matured well and is not to deep for access.  He has returned today for re examination to determine if the fistula should be revised.    Pt returns today for follow up.  Pt states he has no pain, weakness or loss of sensation.  Allergies  Allergen Reactions  . Ativan [Lorazepam] Anxiety and Other (See Comments)    Pt gets more agitated  . Adhesive [Tape] Other (See Comments)    blisters    Current Outpatient Medications  Medication Sig Dispense Refill  . acetaminophen (TYLENOL) 650 MG CR tablet Take 650 mg by mouth every 8 (eight) hours as needed for pain.    Marland Kitchen amLODipine (NORVASC) 5 MG tablet TAKE 1 TABLET BY MOUTH EVERY DAY (Patient taking differently: Take 5 mg by mouth daily.) 90 tablet 1  . atorvastatin (LIPITOR) 10 MG tablet TAKE 1 TABLET BY MOUTH EVERY DAY AT 6PM (Patient taking differently: Take 10 mg by mouth at bedtime.) 90 tablet 1  . blood glucose meter kit and supplies KIT Dispense based on patient and insurance preference. Use up to four times daily as directed. (FOR ICD-9 250.00, 250.01). 1 each 0  . calcitRIOL (ROCALTROL) 0.25 MCG capsule Take 1 capsule by mouth once daily (Patient taking differently: Take 0.25 mcg by mouth daily.) 90 capsule 0  . carvedilol (COREG) 25 MG tablet TAKE 1 TABLET BY MOUTH TWICE DAILY WITH A MEAL (Patient taking differently: Take 25 mg by mouth 2 (two) times daily with a meal.) 180 tablet 0  . cholecalciferol (VITAMIN D) 1000 units tablet Take 1 tablet (1,000 Units total) by mouth daily. 30 tablet 1  . clopidogrel (PLAVIX) 75 MG tablet TAKE 1 TABLET BY MOUTH EVERY DAY (Patient taking differently: Take 75 mg by mouth daily.) 90 tablet 0  . folic acid (FOLVITE) 1 MG tablet Take 1 mg by mouth daily.    Marland Kitchen glucose blood test strip Use  Contour test strips as instructed to check blood sugar four times daily. 200 each 2  . hydrALAZINE (APRESOLINE) 100 MG tablet TAKE 1 TABLET BY MOUTH TWICE A DAY 180 tablet 0  . insulin aspart (NOVOLOG FLEXPEN) 100 UNIT/ML FlexPen Inject 10-12 Units into the skin See admin instructions. Inject 10 units subcutaneously before breakfast and 12 units before supper 15 mL 5  . Insulin Syringe-Needle U-100 (INSULIN SYRINGE 1CC/31GX5/16") 31G X 5/16" 1 ML MISC 1 each by Does not apply route 3 (three) times daily. Use insulin syringe to inject insulin three times daily. 100 each 2  . isosorbide mononitrate (IMDUR) 30 MG 24 hr tablet TAKE 2 TABLETS DAILY. PLEASE MAKE OVERDUE APPT WITH DR. Caryl Comes BEFORE ANYMORE REFILLS. 1ST ATTEMPT (Patient taking differently: Take 60 mg by mouth daily.) 180 tablet 3  . liraglutide (VICTOZA) 18 MG/3ML SOPN Inject 1.8 mg into the skin daily before supper. 9 mL 1  . Omega-3 Fatty Acids (FISH OIL) 1000 MG CAPS Take 1 capsule (1,000 mg total) by mouth every morning. 30 capsule 1  . pantoprazole (PROTONIX) 40 MG tablet TAKE 1 TABLET BY MOUTH EVERY DAY (Patient taking differently: Take 40 mg by mouth daily.) 90 tablet 0  . PRESCRIPTION MEDICATION Place into the left eye See admin instructions.  Injections at [facility] once every 4 weeks - left eye for diabetic retinopathy    . sertraline (ZOLOFT) 100 MG tablet TAKE 1 TABLET BY MOUTH EVERY DAY (Patient taking differently: Take 100 mg by mouth daily with supper.) 90 tablet 1  . sodium bicarbonate 650 MG tablet Take 2 tablets (1,300 mg total) by mouth 2 (two) times daily.    Marland Kitchen torsemide (DEMADEX) 20 MG tablet Take 1 tablet (20 mg total) by mouth 2 (two) times daily. Can take extra 15m daily for weight gain or increased swelling    . TRESIBA FLEXTOUCH 200 UNIT/ML FlexTouch Pen INJECT 44 UNITS SUBCUTANEOUSLY ONCE DAILY 9 mL 2  . vitamin B-12 (CYANOCOBALAMIN) 100 MCG tablet Take 100 mcg by mouth daily.     No current  facility-administered medications for this visit.     ROS:  See HPI  Physical Exam:    Findings:  +--------------------+----------+-----------------+--------+  AVF         PSV (cm/s)Flow Vol (mL/min)Comments  +--------------------+----------+-----------------+--------+  Native artery inflow  340      490          +--------------------+----------+-----------------+--------+  AVF Anastomosis     424                 +--------------------+----------+-----------------+--------+     +------------+----------+-------------+----------+--------+  OUTFLOW VEINPSV (cm/s)Diameter (cm)Depth (cm)Describe  +------------+----------+-------------+----------+--------+  Shoulder    117    0.62     0.55  tortuous  +------------+----------+-------------+----------+--------+  Prox UA     65    0.76     0.57  tortuous  +------------+----------+-------------+----------+--------+  Mid UA     122    0.63     0.41  tortuous  +------------+----------+-------------+----------+--------+  Dist UA     149    0.65     0.26  tortuous  +------------+----------+-------------+----------+--------+  AC Fossa    304    0.64     0.30        +------------+----------+-------------+----------+--------+        Summary:  Patent arteriovenous fistula.   Incision:  Well healed Extremities:  Grip 5/5, palpable thrill and radial pulse    Assessment/Plan:  This is a 80y.o. male who is s/p: Left BC fistula creation  The fistula is very tortuous the size in diameter is goo> 0.6 and the depth< 0.6.  The fistula would be hard to access due to the tortuosity in it.  I will schedule him for revision/ligation of branches and possible transposition.  He is currently not on HD.     ERoxy HorsemanPA-C [facility] [phone]  Clinic MD:   BTrula Slade

## 2020-07-28 DIAGNOSIS — N2581 Secondary hyperparathyroidism of renal origin: Secondary | ICD-10-CM | POA: Diagnosis not present

## 2020-07-28 DIAGNOSIS — Z8674 Personal history of sudden cardiac arrest: Secondary | ICD-10-CM | POA: Diagnosis not present

## 2020-07-28 DIAGNOSIS — E1122 Type 2 diabetes mellitus with diabetic chronic kidney disease: Secondary | ICD-10-CM | POA: Diagnosis not present

## 2020-07-28 DIAGNOSIS — N184 Chronic kidney disease, stage 4 (severe): Secondary | ICD-10-CM | POA: Diagnosis not present

## 2020-07-28 DIAGNOSIS — D509 Iron deficiency anemia, unspecified: Secondary | ICD-10-CM | POA: Diagnosis not present

## 2020-07-28 DIAGNOSIS — I129 Hypertensive chronic kidney disease with stage 1 through stage 4 chronic kidney disease, or unspecified chronic kidney disease: Secondary | ICD-10-CM | POA: Diagnosis not present

## 2020-07-28 DIAGNOSIS — I770 Arteriovenous fistula, acquired: Secondary | ICD-10-CM | POA: Diagnosis not present

## 2020-07-29 ENCOUNTER — Other Ambulatory Visit: Payer: Self-pay | Admitting: Family Medicine

## 2020-07-29 DIAGNOSIS — I152 Hypertension secondary to endocrine disorders: Secondary | ICD-10-CM

## 2020-07-29 DIAGNOSIS — E1159 Type 2 diabetes mellitus with other circulatory complications: Secondary | ICD-10-CM

## 2020-08-03 ENCOUNTER — Other Ambulatory Visit (HOSPITAL_COMMUNITY)
Admission: RE | Admit: 2020-08-03 | Discharge: 2020-08-03 | Disposition: A | Discharge status: Home / Self Care | Payer: Medicare Other | Source: Ambulatory Visit | Attending: Vascular Surgery | Admitting: Vascular Surgery

## 2020-08-03 ENCOUNTER — Other Ambulatory Visit: Payer: Medicare Other

## 2020-08-03 DIAGNOSIS — Z20822 Contact with and (suspected) exposure to covid-19: Secondary | ICD-10-CM | POA: Insufficient documentation

## 2020-08-03 DIAGNOSIS — Z01812 Encounter for preprocedural laboratory examination: Secondary | ICD-10-CM | POA: Diagnosis not present

## 2020-08-04 LAB — SARS CORONAVIRUS 2 (TAT 6-24 HRS): SARS Coronavirus 2: NEGATIVE

## 2020-08-05 ENCOUNTER — Encounter (HOSPITAL_COMMUNITY): Payer: Medicare Other

## 2020-08-05 ENCOUNTER — Encounter: Payer: Self-pay | Admitting: Internal Medicine

## 2020-08-05 ENCOUNTER — Encounter (HOSPITAL_COMMUNITY): Payer: Self-pay | Admitting: Vascular Surgery

## 2020-08-05 ENCOUNTER — Encounter: Payer: Self-pay | Admitting: Emergency Medicine

## 2020-08-05 MED ORDER — DEXTROSE 5 % IV SOLN
3.0000 g | INTRAVENOUS | Status: AC
  Administered 2020-08-06: 3 g via INTRAVENOUS
  Filled 2020-08-05: qty 3

## 2020-08-05 NOTE — Progress Notes (Signed)
PERIOPERATIVE PRESCRIPTION FOR IMPLANTED CARDIAC DEVICE PROGRAMMING   Patient Information: Name: David [name], David [name]  DOB: [birth year]  MRN: [id number]   Planned Procedure: Transposition and Branch Ligation of Left Upper Extremity Arteriovenous Fistula  Surgeon: Dr Jamelle Haring  Date of Procedure: 3/24/2  Cautery will be used.  Position during surgery: Supine   Please send documentation back to:  [facility] (Fax # [phone])   Marveen Reeks, RN  08/05/2020 9:15 AM        Device Information:   Clinic EP Physician:   Virl Axe, MD Device Type:  Pacemaker Manufacturer and Phone #:  Medtronic: [phone] Pacemaker Dependent?: unknown Date of Last Device Check:  overdue for follow-up       Normal Device Function?:  Unknown   Electrophysiologist's Recommendations:    Have magnet available.  Provide continuous ECG monitoring when magnet is used or reprogramming is to be performed.   Patient overdue for follow-up Medtronic industry representative should interrogate device prior to procedure to advise on programming . Per Device Clinic Standing Orders, Drake Leach  08/05/2020 10:12 AM

## 2020-08-05 NOTE — Progress Notes (Addendum)
Spoke with wife David [name] for [employer].  David [name] stated that patient does not have any shortness of breath, fever, cough or chest pain.  PCP - Dr Dimas Chyle Cardiologist - Dr Skeet Latch EP Physician - Dr Virl Axe Nephrologist - Dr Marval Regal Endocrinology - Dr Elayne Snare  Chest x-ray - 04/20/20 (1V) EKG - 04/20/20 Stress Test - n/a ECHO - 04/21/20 Cardiac Cath - 11/25/10  ICD Pacemaker - Medtronic .  Last remote check was on 12/03/19.  Faxed Perioperative prescription for ICD programming request. Emailed [employer] Reps.  [name] and [name]. IB sent to [facility].   Sleep Study -  Yes CPAP - uses cpap  Fasting Blood Sugar - 120-130s Checks Blood Sugar 2-3 times a day  . THE NIGHT BEFORE SURGERY, take 22 units of Tresiba Insulin.  Do not take Novolog Insulin tonight.     . . If your CBG is greater than 220 mg/dL, you may take  of your sliding scale (correction) dose of insulin.  . If your blood sugar is less than 70 mg/dL, you will need to treat for low blood sugar: o Treat a low blood sugar (less than 70 mg/dL) with  cup of clear juice (cranberry or apple), 4 glucose tablets, OR glucose gel. o Recheck blood sugar in 15 minutes after treatment (to make sure it is greater than 70 mg/dL). If your blood sugar is not greater than 70 mg/dL on recheck, call [phone] for further instructions.  Blood Thinner Instructions:  Last dose of plavix was on 07/31/20.    Anesthesia review: Yes  STOP now taking any Aspirin (unless otherwise instructed by your surgeon), Aleve, Naproxen, Ibuprofen, Motrin, Advil, Goody's, BC's, all herbal medications, fish oil, and all vitamins.   Coronavirus Screening Covid test on 08/03/20 was negative.  Patient verbalized understanding of instructions that were given via phone.

## 2020-08-05 NOTE — Progress Notes (Signed)
Anesthesia Chart Review: Same day workup  IDDM 2 followed by endocrinologist Dr. Dwyane Dee.  Has insulin pump in place.  Last A1c 6.5 on 03/30/2020.  However Dr. Dwyane Dee did note that this may be artificially low due to patient's anemia.  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  Patient reported last dose Plavix 07/31/2020.  OSA on CPAP, followed by cardiologist Dr. Radford Pax.  Last seen 01/17/2020, tolerating therapy well, good compliance.  History of HTN, HOCM, SSS s/p Medtronic dual-chamber PPM originally implanted 2009 - generator change 2017, paroxysmal A. fib, nonobstructive CAD.  Cardiology notes state he had a cardiac catheterization in 2012 in [place] that showed nonobstructive disease.  He is followed by EP cardiology as well as general cardiology.  Hypertension is managed by his nephrologist Dr. Marval Regal.  Recent admission December 2021 for acute on chronic diastolic CHF, clinically improved with diuresis, echo showed EF of 55%.  He is followed by nephrology, recent creation of LUE AV fistula 04/16/2020, he was discharged for follow-up with Dr. Marval Regal.  Last remote device check 12/03/2019 was normal.  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  Last creatinine on 07/08/2020 was 3.35.  Anemia, last hemoglobin 9.8 on 07/08/2020.  Will need day of surgery labs and evaluation.  Perioperative prescription for implanted cardiac device programming per progress note 08/05/2020: Device Information:  Clinic EP Physician:Steven Caryl Comes, MD Device Type:Pacemaker Manufacturer and Phone #:Medtronic: [phone] Pacemaker Dependent?:unknown Date of Last Device Check:overdue for follow-upNormal Device Function?:Unknown  Electrophysiologist's Recommendations:   Have magnet available.  Provide continuous ECG monitoring when magnet is used or reprogramming is to be performed.  Patient overdue  for follow-up Medtronic industry representative should interrogate device prior to procedure to advise on programming .  EKG 04/20/2020: Paced rhythm  TTE 04/21/20: 1. Left ventricular ejection fraction, by estimation, is 55 to 60%. The  left ventricle has normal function. The left ventricle has no regional  wall motion abnormalities. There is moderate concentric left ventricular  hypertrophy. Left ventricular  diastolic parameters are consistent with Grade II diastolic dysfunction  (pseudonormalization). Elevated left atrial pressure.  2. Right ventricular systolic function is normal. The right ventricular  size is normal. Mildly increased right ventricular wall thickness.  Tricuspid regurgitation signal is inadequate for assessing PA pressure.  3. Left atrial size was mildly dilated.  4. A small pericardial effusion is present. The pericardial effusion is  circumferential. There is no evidence of cardiac tamponade.  5. The mitral valve is normal in structure. No evidence of mitral valve  regurgitation.  6. 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 There is mild calcification of the  aortic valve. There is moderate thickening of the aortic valve. Aortic  valve regurgitation is trivial. Mild to moderate aortic valve  sclerosis/calcification is present, without any  evidence of aortic stenosis.  7. Aortic dilatation noted. There is mild dilatation of the aortic root,  measuring 41 mm.  8. The inferior vena cava is dilated in size with >50% respiratory  variability, suggesting right atrial pressure of 8 mmHg.  Wynonia Musty [facility]/Anesthesiology Phone [phone] 08/05/2020 11:16 AM

## 2020-08-05 NOTE — Anesthesia Preprocedure Evaluation (Addendum)
Anesthesia Evaluation  Patient identified by MRN, date of birth, ID band Patient awake    Reviewed: Allergy & Precautions, NPO status , Patient's Chart, lab work & pertinent test results  Airway Mallampati: III  TM Distance: >3 FB Neck ROM: Full    Dental  (+) Teeth Intact, Dental Advisory Given   Pulmonary former smoker,    breath sounds clear to auscultation       Cardiovascular hypertension,  Rhythm:Regular Rate:Normal     Neuro/Psych    GI/Hepatic   Endo/Other  diabetes  Renal/GU      Musculoskeletal   Abdominal (+) + obese,   Peds  Hematology   Anesthesia Other Findings   Reproductive/Obstetrics                            Anesthesia Physical Anesthesia Plan  ASA: III  Anesthesia Plan: Regional   Post-op Pain Management:    Induction: Intravenous  PONV Risk Score and Plan: Ondansetron and Propofol infusion  Airway Management Planned: Natural Airway and Simple Face Mask  Additional Equipment:   Intra-op Plan:   Post-operative Plan:   Informed Consent: I have reviewed the patients History and Physical, chart, labs and discussed the procedure including the risks, benefits and alternatives for the proposed anesthesia with the patient or authorized representative who has indicated his/her understanding and acceptance.     Dental advisory given  Plan Discussed with: CRNA and Anesthesiologist  Anesthesia Plan Comments: (PAT note by Karoline Caldwell, PA-C: IDDM 2 followed by endocrinologist Dr. Dwyane Dee.  Has insulin pump in place.  Last A1c 6.5 on 03/30/2020.  However Dr. Dwyane Dee did note that this may be artificially low due to patient's anemia.  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  Patient reported last dose Plavix 07/31/2020.  OSA on CPAP, followed by cardiologist Dr. Radford Pax.  Last  seen 01/17/2020, tolerating therapy well, good compliance.  History of HTN, HOCM, SSS s/p Medtronic dual-chamber PPM originally implanted 2009 - generator change 2017, paroxysmal A. fib, nonobstructive CAD.  Cardiology notes state he had a cardiac catheterization in 2012 in [place] that showed nonobstructive disease.  He is followed by EP cardiology as well as general cardiology.  Hypertension is managed by his nephrologist Dr. Marval Regal.  Recent admission December 2021 for acute on chronic diastolic CHF, clinically improved with diuresis, echo showed EF of 55%.  He is followed by nephrology, recent creation of LUE AV fistula 04/16/2020, he was discharged for follow-up with Dr. Marval Regal.  Last remote device check 12/03/2019 was normal.  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  Last creatinine on 07/08/2020 was 3.35.  Anemia, last hemoglobin 9.8 on 07/08/2020.  Will need day of surgery labs and evaluation.  Perioperative prescription for implanted cardiac device programming per progress note 08/05/2020: Device Information:  Clinic EP Physician:Steven Caryl Comes, MD Device Type:Pacemaker Manufacturer and Phone #:Medtronic: [phone] Pacemaker Dependent?:unknown Date of Last Device Check:overdue for follow-upNormal Device Function?:Unknown  Electrophysiologist's Recommendations:  Have magnet available. Provide continuous ECG monitoring when magnet is used or reprogramming is to be performed.  Patient overdue for follow-up Medtronic industry representative should interrogate device prior to procedure to advise on programming .  EKG 04/20/2020: Paced rhythm  TTE 04/21/20: 1. Left ventricular ejection fraction, by estimation, is 55 to 60%. The  left ventricle has normal function. The left ventricle has no regional  wall motion  abnormalities. There is moderate concentric left ventricular  hypertrophy. Left ventricular  diastolic parameters are consistent with Grade II diastolic  dysfunction  (pseudonormalization). Elevated left atrial pressure.  2. Right ventricular systolic function is normal. The right ventricular  size is normal. Mildly increased right ventricular wall thickness.  Tricuspid regurgitation signal is inadequate for assessing PA pressure.  3. Left atrial size was mildly dilated.  4. A small pericardial effusion is present. The pericardial effusion is  circumferential. There is no evidence of cardiac tamponade.  5. The mitral valve is normal in structure. No evidence of mitral valve  regurgitation.  6. 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 There is mild calcification of the  aortic valve. There is moderate thickening of the aortic valve. Aortic  valve regurgitation is trivial. Mild to moderate aortic valve  sclerosis/calcification is present, without any  evidence of aortic stenosis.  7. Aortic dilatation noted. There is mild dilatation of the aortic root,  measuring 41 mm.  8. The inferior vena cava is dilated in size with >50% respiratory  variability, suggesting right atrial pressure of 8 mmHg.  )       Anesthesia Quick Evaluation

## 2020-08-06 ENCOUNTER — Ambulatory Visit (HOSPITAL_COMMUNITY): Payer: Medicare Other | Admitting: Physician Assistant

## 2020-08-06 ENCOUNTER — Ambulatory Visit: Payer: Medicare Other | Admitting: Endocrinology

## 2020-08-06 ENCOUNTER — Ambulatory Visit (HOSPITAL_COMMUNITY)
Admission: RE | Admit: 2020-08-06 | Discharge: 2020-08-06 | Disposition: A | Discharge status: Home / Self Care | Payer: Medicare Other | Source: Ambulatory Visit | Attending: Vascular Surgery | Admitting: Vascular Surgery

## 2020-08-06 ENCOUNTER — Encounter (HOSPITAL_COMMUNITY)
Admission: RE | Disposition: A | Discharge status: Home / Self Care | Payer: Self-pay | Source: Ambulatory Visit | Attending: Vascular Surgery

## 2020-08-06 ENCOUNTER — Other Ambulatory Visit: Payer: Self-pay

## 2020-08-06 ENCOUNTER — Encounter (HOSPITAL_COMMUNITY): Payer: Self-pay | Admitting: Vascular Surgery

## 2020-08-06 DIAGNOSIS — Z794 Long term (current) use of insulin: Secondary | ICD-10-CM | POA: Insufficient documentation

## 2020-08-06 DIAGNOSIS — N184 Chronic kidney disease, stage 4 (severe): Secondary | ICD-10-CM

## 2020-08-06 DIAGNOSIS — E1122 Type 2 diabetes mellitus with diabetic chronic kidney disease: Secondary | ICD-10-CM | POA: Diagnosis not present

## 2020-08-06 DIAGNOSIS — Z79899 Other long term (current) drug therapy: Secondary | ICD-10-CM | POA: Insufficient documentation

## 2020-08-06 DIAGNOSIS — I509 Heart failure, unspecified: Secondary | ICD-10-CM | POA: Diagnosis not present

## 2020-08-06 DIAGNOSIS — Z7902 Long term (current) use of antithrombotics/antiplatelets: Secondary | ICD-10-CM | POA: Insufficient documentation

## 2020-08-06 DIAGNOSIS — Z888 Allergy status to other drugs, medicaments and biological substances status: Secondary | ICD-10-CM | POA: Insufficient documentation

## 2020-08-06 DIAGNOSIS — I132 Hypertensive heart and chronic kidney disease with heart failure and with stage 5 chronic kidney disease, or end stage renal disease: Secondary | ICD-10-CM | POA: Diagnosis not present

## 2020-08-06 DIAGNOSIS — N185 Chronic kidney disease, stage 5: Secondary | ICD-10-CM | POA: Diagnosis not present

## 2020-08-06 HISTORY — PX: BASCILIC VEIN TRANSPOSITION: SHX5742

## 2020-08-06 LAB — POCT I-STAT, CHEM 8
BUN: 109 mg/dL — ABNORMAL HIGH (ref 8–23)
Calcium, Ion: 1.27 mmol/L (ref 1.15–1.40)
Chloride: 108 mmol/L (ref 98–111)
Creatinine, Ser: 4 mg/dL — ABNORMAL HIGH (ref 0.61–1.24)
Glucose, Bld: 126 mg/dL — ABNORMAL HIGH (ref 70–99)
HCT: 26 % — ABNORMAL LOW (ref 39.0–52.0)
Hemoglobin: 8.8 g/dL — ABNORMAL LOW (ref 13.0–17.0)
Potassium: 4.2 mmol/L (ref 3.5–5.1)
Sodium: 140 mmol/L (ref 135–145)
TCO2: 20 mmol/L — ABNORMAL LOW (ref 22–32)

## 2020-08-06 LAB — GLUCOSE, CAPILLARY
Glucose-Capillary: 116 mg/dL — ABNORMAL HIGH (ref 70–99)
Glucose-Capillary: 131 mg/dL — ABNORMAL HIGH (ref 70–99)

## 2020-08-06 SURGERY — TRANSPOSITION, VEIN, BASILIC
Anesthesia: Regional | Site: Arm Upper | Laterality: Left

## 2020-08-06 MED ORDER — 0.9 % SODIUM CHLORIDE (POUR BTL) OPTIME
TOPICAL | Status: DC | PRN
  Administered 2020-08-06: 1000 mL

## 2020-08-06 MED ORDER — FENTANYL CITRATE (PF) 100 MCG/2ML IJ SOLN
25.0000 ug | INTRAMUSCULAR | Status: DC | PRN
Start: 2020-08-06 — End: 2020-08-06

## 2020-08-06 MED ORDER — LACTATED RINGERS IV SOLN: INTRAVENOUS | Status: DC | PRN

## 2020-08-06 MED ORDER — LIDOCAINE-EPINEPHRINE (PF) 1 %-1:200000 IJ SOLN
INTRAMUSCULAR | Status: DC | PRN
  Administered 2020-08-06: 4 mL

## 2020-08-06 MED ORDER — FENTANYL CITRATE (PF) 100 MCG/2ML IJ SOLN
INTRAMUSCULAR | Status: DC | PRN
  Administered 2020-08-06: 50 ug via INTRAVENOUS

## 2020-08-06 MED ORDER — PHENYLEPHRINE 40 MCG/ML (10ML) SYRINGE FOR IV PUSH (FOR BLOOD PRESSURE SUPPORT)
PREFILLED_SYRINGE | INTRAVENOUS | Status: AC
  Filled 2020-08-06: qty 10

## 2020-08-06 MED ORDER — SODIUM CHLORIDE 0.9 % IV SOLN: INTRAVENOUS | Status: DC

## 2020-08-06 MED ORDER — PHENYLEPHRINE HCL-NACL 10-0.9 MG/250ML-% IV SOLN
INTRAVENOUS | Status: DC | PRN
  Administered 2020-08-06: 75 ug/min via INTRAVENOUS
  Administered 2020-08-06: 50 ug/min via INTRAVENOUS

## 2020-08-06 MED ORDER — SODIUM CHLORIDE 0.9 % IV SOLN: INTRAVENOUS | Status: DC | PRN

## 2020-08-06 MED ORDER — EPHEDRINE SULFATE 50 MG/ML IJ SOLN
INTRAMUSCULAR | Status: DC | PRN
  Administered 2020-08-06 (×2): 10 mg via INTRAVENOUS
  Administered 2020-08-06: 5 mg via INTRAVENOUS

## 2020-08-06 MED ORDER — PROPOFOL 10 MG/ML IV BOLUS
INTRAVENOUS | Status: DC | PRN
  Administered 2020-08-06: 10 mg via INTRAVENOUS

## 2020-08-06 MED ORDER — ONDANSETRON HCL 4 MG/2ML IJ SOLN: 4.0000 mg | Freq: Once | INTRAMUSCULAR | Status: DC | PRN

## 2020-08-06 MED ORDER — ONDANSETRON HCL 4 MG/2ML IJ SOLN
INTRAMUSCULAR | Status: AC
  Filled 2020-08-06: qty 2

## 2020-08-06 MED ORDER — TRAMADOL HCL 50 MG PO TABS: 50.0000 mg | ORAL_TABLET | Freq: Four times a day (QID) | ORAL | 0 refills | Status: DC | PRN

## 2020-08-06 MED ORDER — OXYCODONE HCL 5 MG PO TABS: 5.0000 mg | ORAL_TABLET | Freq: Once | ORAL | Status: DC | PRN

## 2020-08-06 MED ORDER — OXYCODONE HCL 5 MG PO TABS
5.0000 mg | ORAL_TABLET | Freq: Once | ORAL | Status: DC | PRN
Start: 2020-08-06 — End: 2020-08-06

## 2020-08-06 MED ORDER — OXYCODONE HCL 5 MG/5ML PO SOLN: 5.0000 mg | Freq: Once | ORAL | Status: DC | PRN

## 2020-08-06 MED ORDER — PROPOFOL 500 MG/50ML IV EMUL
INTRAVENOUS | Status: DC | PRN
  Administered 2020-08-06: 60 ug/kg/min via INTRAVENOUS

## 2020-08-06 MED ORDER — ONDANSETRON HCL 4 MG/2ML IJ SOLN
INTRAMUSCULAR | Status: DC | PRN
  Administered 2020-08-06: 4 mg via INTRAVENOUS

## 2020-08-06 MED ORDER — CHLORHEXIDINE GLUCONATE 4 % EX LIQD: 60.0000 mL | Freq: Once | CUTANEOUS | Status: DC

## 2020-08-06 MED ORDER — PROPOFOL 10 MG/ML IV BOLUS
INTRAVENOUS | Status: AC
  Filled 2020-08-06: qty 20

## 2020-08-06 MED ORDER — CHLORHEXIDINE GLUCONATE 0.12 % MT SOLN
OROMUCOSAL | Status: AC
  Administered 2020-08-06: 15 mL
  Filled 2020-08-06: qty 15

## 2020-08-06 MED ORDER — FENTANYL CITRATE (PF) 250 MCG/5ML IJ SOLN
INTRAMUSCULAR | Status: AC
  Filled 2020-08-06: qty 5

## 2020-08-06 SURGICAL SUPPLY — 39 items
ARMBAND PINK RESTRICT EXTREMIT (MISCELLANEOUS) ×2 IMPLANT
BENZOIN TINCTURE PRP APPL 2/3 (GAUZE/BANDAGES/DRESSINGS) ×2 IMPLANT
CANISTER SUCT 3000ML PPV (MISCELLANEOUS) ×2 IMPLANT
CANNULA VESSEL 3MM 2 BLNT TIP (CANNULA) ×2 IMPLANT
CHLORAPREP W/TINT 26 (MISCELLANEOUS) ×2 IMPLANT
CLIP VESOCCLUDE MED 6/CT (CLIP) ×2 IMPLANT
CLIP VESOCCLUDE SM WIDE 6/CT (CLIP) ×4 IMPLANT
CLSR STERI-STRIP ANTIMIC 1/2X4 (GAUZE/BANDAGES/DRESSINGS) ×2 IMPLANT
COVER PROBE W GEL 5X96 (DRAPES) ×2 IMPLANT
COVER WAND RF STERILE (DRAPES) ×2 IMPLANT
DERMABOND ADVANCED (GAUZE/BANDAGES/DRESSINGS) ×1
DERMABOND ADVANCED .7 DNX12 (GAUZE/BANDAGES/DRESSINGS) ×1 IMPLANT
DRSG TEGADERM 4X4.75 (GAUZE/BANDAGES/DRESSINGS) ×4 IMPLANT
ELECT REM PT RETURN 9FT ADLT (ELECTROSURGICAL) ×2
ELECTRODE REM PT RTRN 9FT ADLT (ELECTROSURGICAL) ×1 IMPLANT
GAUZE SPONGE 2X2 8PLY STRL LF (GAUZE/BANDAGES/DRESSINGS) ×1 IMPLANT
GLOVE SURG SS PI 8.0 STRL IVOR (GLOVE) ×2 IMPLANT
GLOVE SURG UNDER POLY LF SZ6.5 (GLOVE) ×4 IMPLANT
GOWN STRL REUS W/ TWL LRG LVL3 (GOWN DISPOSABLE) ×2 IMPLANT
GOWN STRL REUS W/ TWL XL LVL3 (GOWN DISPOSABLE) ×1 IMPLANT
GOWN STRL REUS W/TWL LRG LVL3 (GOWN DISPOSABLE) ×2
GOWN STRL REUS W/TWL XL LVL3 (GOWN DISPOSABLE) ×1
INSERT FOGARTY SM (MISCELLANEOUS) IMPLANT
KIT BASIN OR (CUSTOM PROCEDURE TRAY) ×2 IMPLANT
KIT TURNOVER KIT B (KITS) ×2 IMPLANT
NS IRRIG 1000ML POUR BTL (IV SOLUTION) ×2 IMPLANT
PACK CV ACCESS (CUSTOM PROCEDURE TRAY) ×2 IMPLANT
PAD ARMBOARD 7.5X6 YLW CONV (MISCELLANEOUS) ×4 IMPLANT
PENCIL SMOKE EVACUATOR (MISCELLANEOUS) ×2 IMPLANT
SPONGE GAUZE 2X2 STER 10/PKG (GAUZE/BANDAGES/DRESSINGS) ×1
STRIP CLOSURE SKIN 1/2X4 (GAUZE/BANDAGES/DRESSINGS) ×2 IMPLANT
SUT MNCRL AB 4-0 PS2 18 (SUTURE) ×4 IMPLANT
SUT PROLENE 6 0 BV (SUTURE) ×2 IMPLANT
SUT VIC AB 3-0 SH 27 (SUTURE) ×1
SUT VIC AB 3-0 SH 27X BRD (SUTURE) ×1 IMPLANT
SYR CONTROL 10ML LL (SYRINGE) ×2 IMPLANT
TOWEL GREEN STERILE (TOWEL DISPOSABLE) ×2 IMPLANT
UNDERPAD 30X36 HEAVY ABSORB (UNDERPADS AND DIAPERS) ×2 IMPLANT
WATER STERILE IRR 1000ML POUR (IV SOLUTION) ×2 IMPLANT

## 2020-08-06 NOTE — Discharge Instructions (Signed)
   [facility]  Discharge Instructions  AV Fistula or Graft Surgery for Dialysis Access  Please refer to the following instructions for your post-procedure care. Your surgeon or physician assistant will discuss any changes with you.  Activity  You may drive the day following your surgery, if you are comfortable and no longer taking prescription pain medication. Resume full activity as the soreness in your incision resolves.  Bathing/Showering  You may shower after you go home. Keep your incision dry for 48 hours. Do not soak in a bathtub, hot tub, or swim until the incision heals completely. You may not shower if you have a hemodialysis catheter.  Incision Care  Clean your incision with mild soap and water after 48 hours. Pat the area dry with a clean towel. You do not need a bandage unless otherwise instructed. Do not apply any ointments or creams to your incision. You may have skin glue on your incision. Do not peel it off. It will come off on its own in about one week. Your arm may swell a bit after surgery. To reduce swelling use pillows to elevate your arm so it is above your heart. Your doctor will tell you if you need to lightly wrap your arm with an ACE bandage.  Diet  Resume your normal diet. There are not special food restrictions following this procedure. In order to heal from your surgery, it is CRITICAL to get adequate nutrition. Your body requires vitamins, minerals, and protein. Vegetables are the best source of vitamins and minerals. Vegetables also provide the perfect balance of protein. Processed food has little nutritional value, so try to avoid this.  Medications  Resume taking all of your medications. If your incision is causing pain, you may take over-the counter pain relievers such as acetaminophen (Tylenol). If you were prescribed a stronger pain medication, please be aware these medications can cause nausea and constipation. Prevent  nausea by taking the medication with a snack or meal. Avoid constipation by drinking plenty of fluids and eating foods with high amount of fiber, such as fruits, vegetables, and grains.  Do not take Tylenol if you are taking prescription pain medications.  Follow up Your surgeon may want to see you in the office following your access surgery. If so, this will be arranged at the time of your surgery.  Please call us immediately for any of the following conditions:  . Increased pain, redness, drainage (pus) from your incision site . Fever of 101 degrees or higher . Severe or worsening pain at your incision site . Hand pain or numbness. .  Reduce your risk of vascular disease:  . Stop smoking. If you would like help, call [facility] at 1-800-QUIT-NOW 213 148 9932) or [place] at 936-464-6523  . Manage your cholesterol . Maintain a desired weight . Control your diabetes . Keep your blood pressure down  Dialysis  It will take several weeks to several months for your new dialysis access to be ready for use. Your surgeon will determine when it is okay to use it. Your nephrologist will continue to direct your dialysis. You can continue to use your Permcath until your new access is ready for use.   08/06/2020 David [name] [id number] 1940-08-15  Surgeon(s): Cherre Robins, MD  Procedure(s): BRANCH LIGATION OF LEFT UPPER EXTREMITY ARTERIOVENOUS FISTULA  x Do not stick fistula for 8 weeks    If you have any questions, please call the office at [phone].

## 2020-08-06 NOTE — Anesthesia Procedure Notes (Signed)
Anesthesia Regional Block: Supraclavicular block   Pre-Anesthetic Checklist: ,, timeout performed, Correct Patient, Correct Site, Correct Laterality, Correct Procedure, Correct Position, site marked, Risks and benefits discussed,  Surgical consent,  Pre-op evaluation,  At surgeon's request and post-op pain management  Laterality: Left  Prep: chloraprep       Needles:  Injection technique: Single-shot  Needle Type: Stimulator Needle - 80          Additional Needles:   Procedures:, nerve stimulator,,,,,,,  Narrative:  Start time: 08/06/2020 7:05 AM End time: 08/06/2020 7:10 AM Injection made incrementally with aspirations every 5 mL.  Performed by: Personally  Anesthesiologist: Roberts Gaudy, MD  Additional Notes: 25 cc 2% lidocaine injected easily

## 2020-08-06 NOTE — Anesthesia Procedure Notes (Signed)
Procedure Name: MAC Date/Time: 08/06/2020 7:33 AM Performed by: Inda Coke, CRNA Pre-anesthesia Checklist: Patient identified, Emergency Drugs available, Suction available, Timeout performed and Patient being monitored Patient Re-evaluated:Patient Re-evaluated prior to induction Oxygen Delivery Method: Simple face mask Induction Type: IV induction Dental Injury: Teeth and Oropharynx as per pre-operative assessment

## 2020-08-06 NOTE — Anesthesia Postprocedure Evaluation (Signed)
Anesthesia Post Note  Patient: David [name]  Procedure(s) Performed: BRANCH LIGATION OF LEFT UPPER EXTREMITY ARTERIOVENOUS FISTULA (Left Arm Upper)     Patient location during evaluation: PACU Anesthesia Type: Regional Level of consciousness: awake and alert Pain management: pain level controlled Vital Signs Assessment: post-procedure vital signs reviewed and stable Respiratory status: spontaneous breathing, nonlabored ventilation, respiratory function stable and patient connected to nasal cannula oxygen Cardiovascular status: stable and blood pressure returned to baseline Postop Assessment: no apparent nausea or vomiting Anesthetic complications: no   No complications documented.  Last Vitals:  Vitals:   08/06/20 0900 08/06/20 0912  BP: (!) 143/43 (!) 128/42  Pulse: 60 60  Resp: 14 19  Temp:  36.4 C  SpO2: 96% 95%    Last Pain:  Vitals:   08/06/20 0912  TempSrc:   PainSc: 0-No pain                 Tamika Nou,Caven COKER

## 2020-08-06 NOTE — Transfer of Care (Signed)
Immediate Anesthesia Transfer of Care Note  Patient: David [name]  Procedure(s) Performed: BRANCH LIGATION OF LEFT UPPER EXTREMITY ARTERIOVENOUS FISTULA (Left Arm Upper)  Patient Location: PACU  Anesthesia Type:MAC combined with regional for post-op pain  Level of Consciousness: awake, alert  and oriented  Airway & Oxygen Therapy: Patient Spontanous Breathing  Post-op Assessment: Report given to RN and Post -op Vital signs reviewed and stable  Post vital signs: Reviewed and stable  Last Vitals:  Vitals Value Taken Time  BP 104/59 08/06/20 0843  Temp    Pulse 61 08/06/20 0845  Resp 17 08/06/20 0845  SpO2 97 % 08/06/20 0845  Vitals shown include unvalidated device data.  Last Pain:  Vitals:   08/06/20 0657  TempSrc:   PainSc: 0-No pain         Complications: No complications documented.

## 2020-08-06 NOTE — Progress Notes (Signed)
[employer] at bedside.

## 2020-08-06 NOTE — Interval H&P Note (Signed)
History and Physical Interval Note:  08/06/2020 7:22 AM  David [name]  has presented today for surgery, with the diagnosis of CHRONIC KIDNEY DISEASE STAGE IV.  The various methods of treatment have been discussed with the patient and family. After consideration of risks, benefits and other options for treatment, the patient has consented to  Procedure(s): TRANSPOSITION AND BRANCH LIGATION OF LEFT UPPER EXTREMITY ARTERIOVENOUS FISTULA (Left) as a surgical intervention.  The patient's history has been reviewed, patient examined, no change in status, stable for surgery.  I have reviewed the patient's chart and labs.  Questions were answered to the patient's satisfaction.     Cherre Robins

## 2020-08-06 NOTE — Progress Notes (Signed)
Called [employer] for rep to come prior to surgery.  Awaiting return call.

## 2020-08-06 NOTE — Op Note (Signed)
DATE OF SERVICE: 08/06/2020  PATIENT:  David [name]  80 y.o. male  PRE-OPERATIVE DIAGNOSIS:  CKD V nearing ESRD  POST-OPERATIVE DIAGNOSIS:  Same  PROCEDURE:   Left upper extremity arteriovenous fistula branch ligation x 2  SURGEON:  Surgeon(s) and Role:    * Cherre Robins, MD - Primary  ASSISTANT: Leontine Locket, PA-C  An assistant was required to facilitate exposure and expedite the case.  ANESTHESIA:   regional and MAC  EBL: min  BLOOD ADMINISTERED:none  DRAINS: none   LOCAL MEDICATIONS USED:  LIDOCAINE   SPECIMEN:  none  COUNTS: confirmed correct.  TOURNIQUET:  none  PATIENT DISPOSITION:  PACU - hemodynamically stable.   Delay start of Pharmacological VTE agent (>24hrs) due to surgical blood loss or risk of bleeding: no  INDICATION FOR PROCEDURE: [name] is a 80 y.o. male with CKD V nearing ESRD. I previously created a left upper extremity arteriovenous fistula 04/16/20. Recent duplex ultrasound shows acceptable size and depth for cannulation, but inadequate flows. A large branch was noted near the anastomosis. After careful discussion of risks, benefits, and alternatives the patient was offered revision of the fistula. The patient understood and wished to proceed.  OPERATIVE FINDINGS: tortuous fistula. One large competing branch was noted near the anastomosis, ligated, and divided. A smaller branch was noted near the shoulder and simply ligated. Improved thrill was noted to the shoulder in the fistula after ligation.  DESCRIPTION OF PROCEDURE: After identification of the patient in the pre-operative holding area, the patient was transferred to the operating room. The patient was positioned supine on the operating room table. Anesthesia was induced. The left arm was prepped and draped in standard fashion. A surgical pause was performed confirming correct patient, procedure, and operative location.  Using intraoperative ultrasound the course of the left  upper extremity brachiocephalic AV fistula was mapped.  Large competing branch was noted near the anastomosis.  This was marked on the skin.  A small branch was noted over the shoulder and marked on the skin.  Incision was made over the 2 branch points carried down onto the combination of Bovie and sharp dissection.  The 2 branches were exposed and encircled with 2-0 silk suture.  The large competing branch was ligated proximally distally and divided.  The smaller distal branch was similarly ligated preoperatively was noted throughout brachiobasilic.  Wounds were closed in layers using 3-0 Vicryl and 4-0 Monocryl.  Steri-Strips were applied.  Upon completion of the case instrument and sharps counts were confirmed correct. The patient was transferred to the PACU in good condition. I was present for all portions of the procedure.  [name]. Stanford Breed, MD [facility] Phone Number: [phone] 08/06/2020 8:34 AM

## 2020-08-06 NOTE — Progress Notes (Signed)
Orthopedic Tech Progress Note Patient Details:  David [name] February 12, 1941 [id number]  Ortho Devices Type of Ortho Device: Arm sling Ortho Device/Splint Location: LUE Ortho Device/Splint Interventions: Ordered,Application,Adjustment   Post Interventions Patient Tolerated: Well Instructions Provided: Care of device,Adjustment of device,Poper ambulation with device   [name] 08/06/2020, 9:53 AM

## 2020-08-07 ENCOUNTER — Encounter (HOSPITAL_COMMUNITY): Payer: Self-pay | Admitting: Vascular Surgery

## 2020-08-13 ENCOUNTER — Other Ambulatory Visit: Payer: Self-pay

## 2020-08-13 ENCOUNTER — Ambulatory Visit (HOSPITAL_COMMUNITY)
Admission: RE | Admit: 2020-08-13 | Discharge: 2020-08-13 | Disposition: A | Discharge status: Home / Self Care | Payer: Medicare Other | Source: Ambulatory Visit | Attending: Nephrology | Admitting: Nephrology

## 2020-08-13 VITALS — BP 132/44 | HR 61

## 2020-08-13 DIAGNOSIS — D631 Anemia in chronic kidney disease: Secondary | ICD-10-CM | POA: Diagnosis not present

## 2020-08-13 DIAGNOSIS — N184 Chronic kidney disease, stage 4 (severe): Secondary | ICD-10-CM

## 2020-08-13 LAB — RENAL FUNCTION PANEL
Albumin: 3.1 g/dL — ABNORMAL LOW (ref 3.5–5.0)
Anion gap: 10 (ref 5–15)
BUN: 97 mg/dL — ABNORMAL HIGH (ref 8–23)
CO2: 20 mmol/L — ABNORMAL LOW (ref 22–32)
Calcium: 9.1 mg/dL (ref 8.9–10.3)
Chloride: 109 mmol/L (ref 98–111)
Creatinine, Ser: 3.96 mg/dL — ABNORMAL HIGH (ref 0.61–1.24)
GFR, Estimated: 15 mL/min — ABNORMAL LOW (ref >60)
Glucose, Bld: 160 mg/dL — ABNORMAL HIGH (ref 70–99)
Phosphorus: 5.1 mg/dL — ABNORMAL HIGH (ref 2.5–4.6)
Potassium: 4.2 mmol/L (ref 3.5–5.1)
Sodium: 139 mmol/L (ref 135–145)

## 2020-08-13 LAB — IRON AND TIBC
Iron: 48 ug/dL (ref 45–182)
Saturation Ratios: 26 % (ref 17.9–39.5)
TIBC: 182 ug/dL — ABNORMAL LOW (ref 250–450)
UIBC: 134 ug/dL

## 2020-08-13 LAB — FERRITIN: Ferritin: 498 ng/mL — ABNORMAL HIGH (ref 24–336)

## 2020-08-13 MED ORDER — DARBEPOETIN ALFA 60 MCG/0.3ML IJ SOSY
60.0000 ug | PREFILLED_SYRINGE | INTRAMUSCULAR | Status: DC
  Administered 2020-08-13: 60 ug via SUBCUTANEOUS

## 2020-08-13 MED ORDER — DARBEPOETIN ALFA 60 MCG/0.3ML IJ SOSY
PREFILLED_SYRINGE | INTRAMUSCULAR | Status: AC
  Filled 2020-08-13: qty 0.3

## 2020-08-14 LAB — POCT HEMOGLOBIN-HEMACUE: Hemoglobin: 9 g/dL — ABNORMAL LOW (ref 13.0–17.0)

## 2020-08-20 ENCOUNTER — Other Ambulatory Visit: Payer: Self-pay

## 2020-08-20 DIAGNOSIS — N184 Chronic kidney disease, stage 4 (severe): Secondary | ICD-10-CM

## 2020-08-24 DIAGNOSIS — E113313 Type 2 diabetes mellitus with moderate nonproliferative diabetic retinopathy with macular edema, bilateral: Secondary | ICD-10-CM | POA: Diagnosis not present

## 2020-08-24 DIAGNOSIS — H35361 Drusen (degenerative) of macula, right eye: Secondary | ICD-10-CM | POA: Diagnosis not present

## 2020-08-24 DIAGNOSIS — H43813 Vitreous degeneration, bilateral: Secondary | ICD-10-CM | POA: Diagnosis not present

## 2020-08-24 DIAGNOSIS — H35373 Puckering of macula, bilateral: Secondary | ICD-10-CM | POA: Diagnosis not present

## 2020-08-27 ENCOUNTER — Other Ambulatory Visit: Payer: Medicare Other

## 2020-08-31 ENCOUNTER — Ambulatory Visit: Payer: Medicare Other | Admitting: Endocrinology

## 2020-09-01 IMAGING — CT CT HEAD CODE STROKE W/O CM
4 series · 16 of 47 positions shown, 18 images · non-contrast
Comparison: 08/30/2017

CLINICAL DATA: Code stroke.  Expressive aphasia

EXAM:
CT HEAD WITHOUT CONTRAST
TECHNIQUE: Contiguous axial images were obtained from the base of the skull
through the vertex without intravenous contrast.

[Series 3: head wo · axial · 0.47mm/px · z∈[+1167,+1292]mm · 7 of 35 slices shown, 9 images]
[im 5/35  brain]
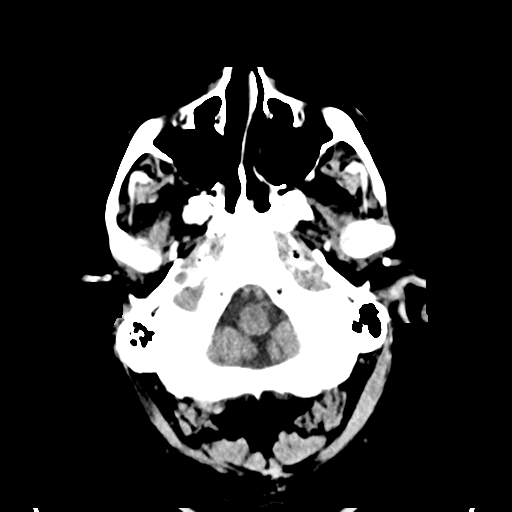
[im 5/35  bone]
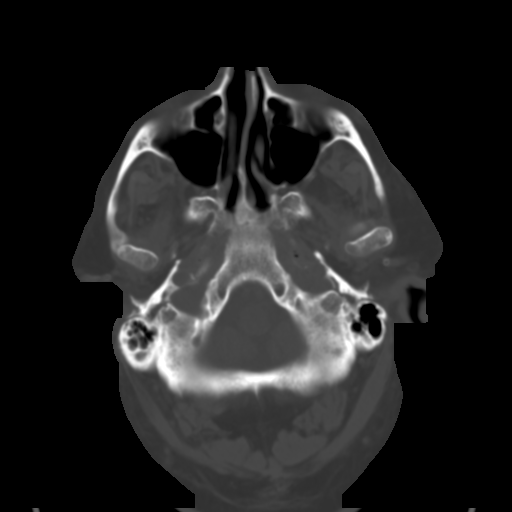
[im 9/35  brain]
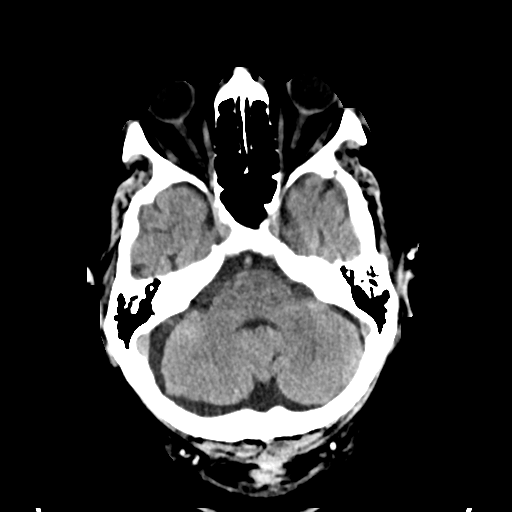
[im 13/35  brain]
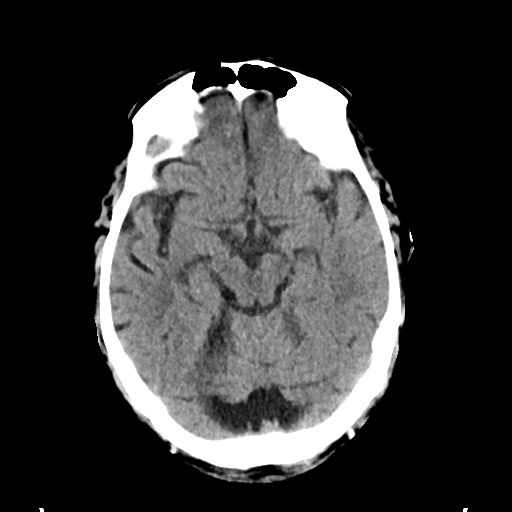
[im 18/35  brain]
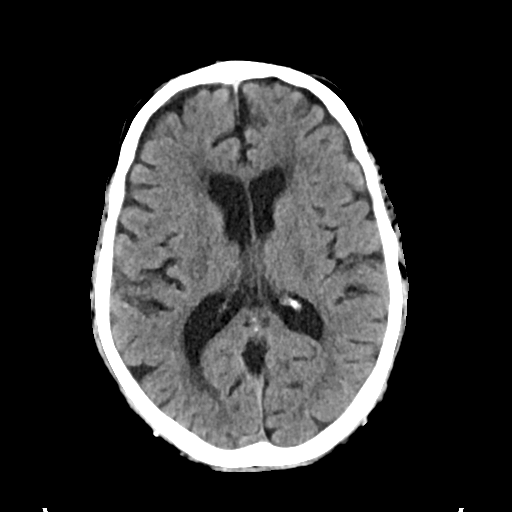
[im 22/35  brain]
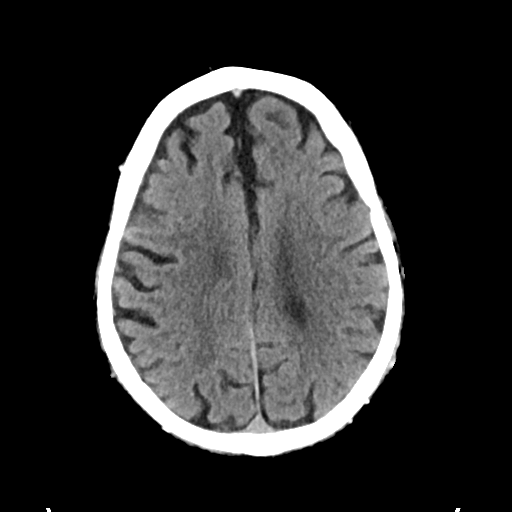
[im 22/35  bone]
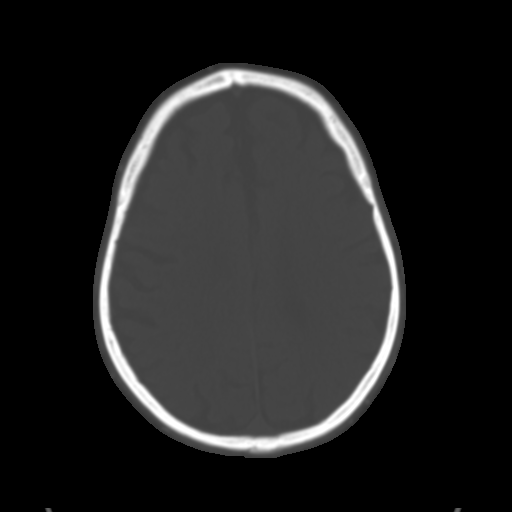
[im 26/35  brain]
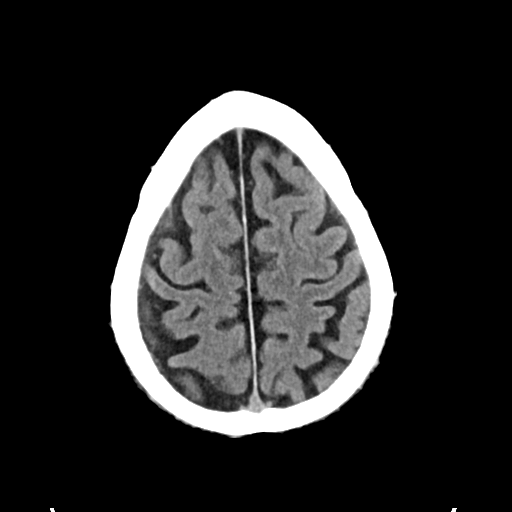
[im 30/35  brain]
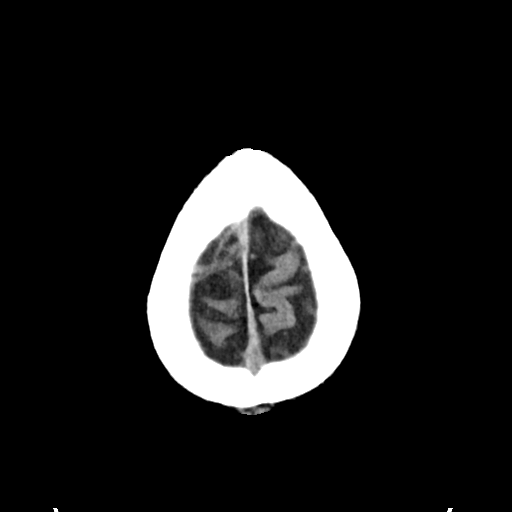

[Series 4: head bone · axial · 0.47mm/px · z∈[+1163,+1197]mm · 3 of 86 slices shown]
[im 9/86  bone]
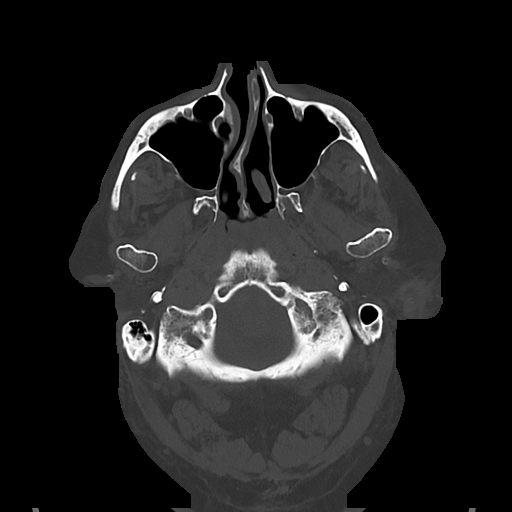
[im 18/86  bone]
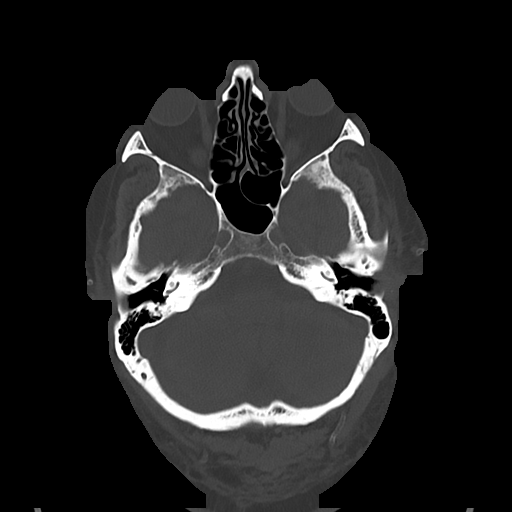
[im 26/86  bone]
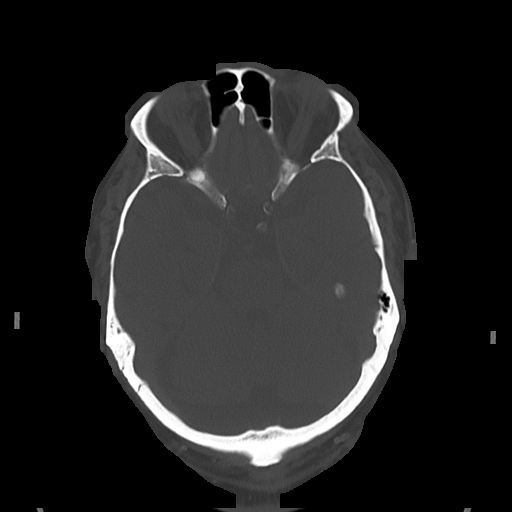

[Series 5: cor soft · coronal · 0.36mm/px · 3 of 72 slices shown]
[im 24/72  brain]
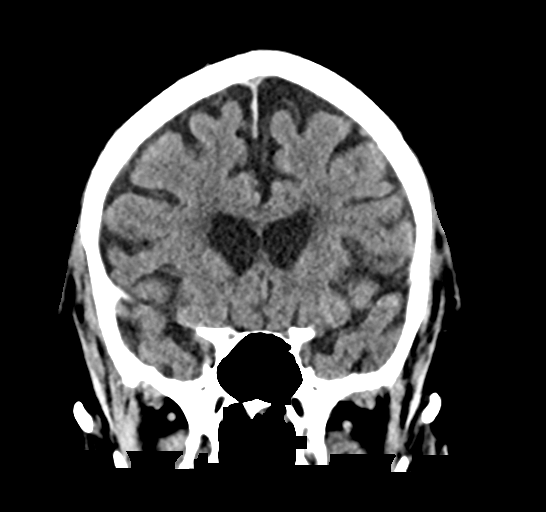
[im 32/72  brain]
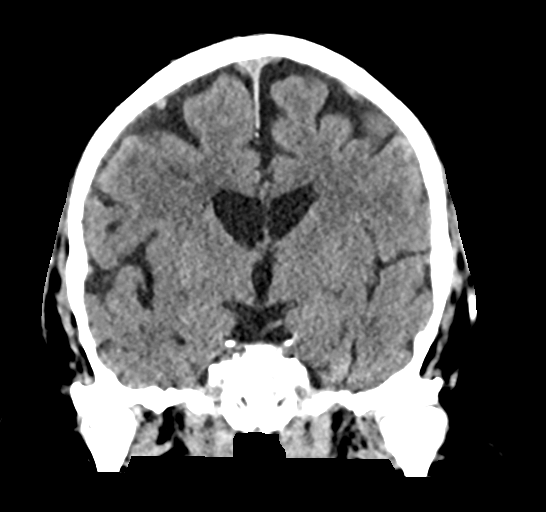
[im 40/72  brain]
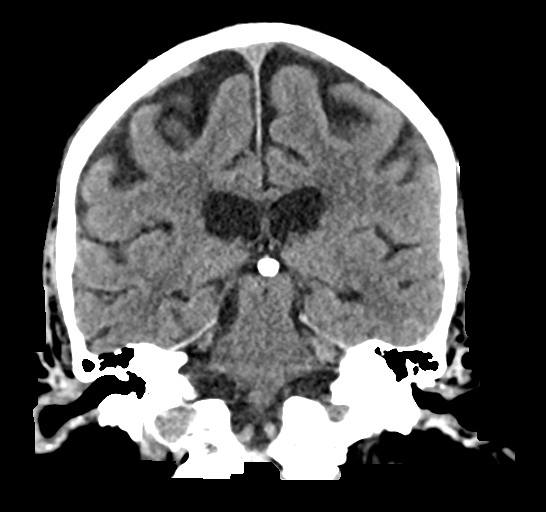

[Series 6: sag soft · sagittal · 0.37mm/px · 3 of 60 slices shown]
[im 20/60  brain]
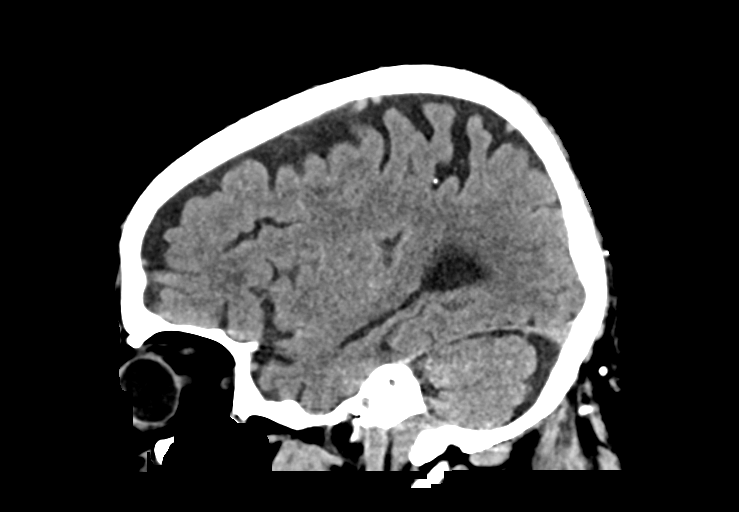
[im 30/60  brain]
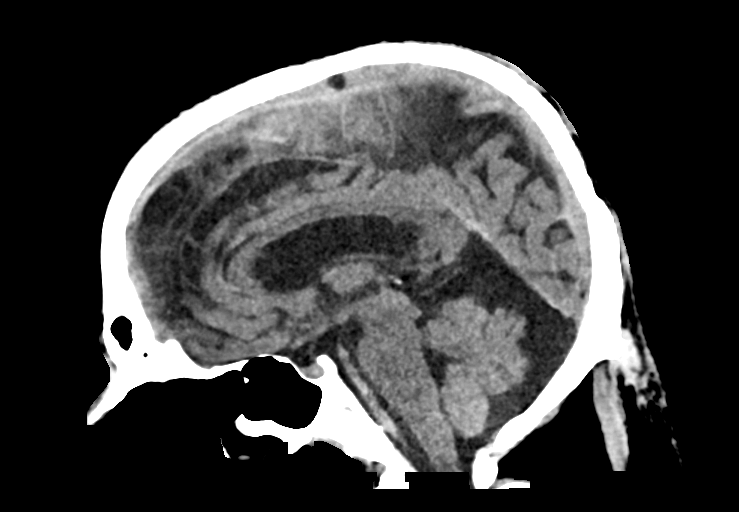
[im 40/60  brain]
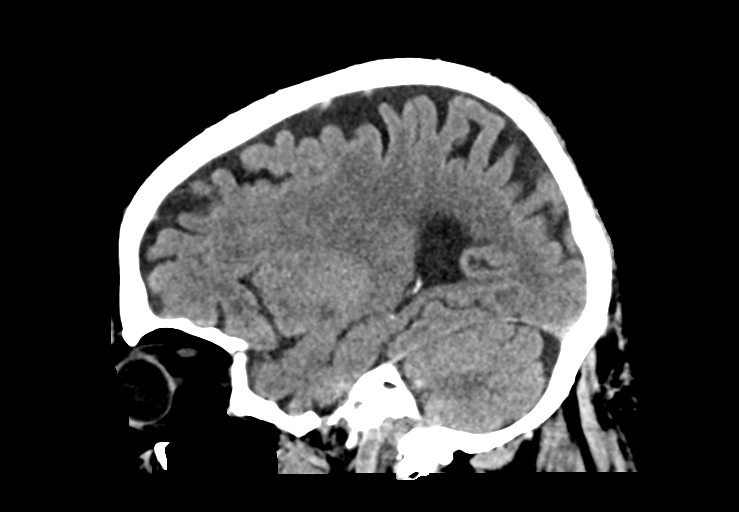

[16 of 47 positions shown; findings below may reference images not displayed]

FINDINGS: Brain: No evidence of acute infarction, hemorrhage, hydrocephalus,
extra-axial collection or mass lesion/mass effect. Small vessel
ischemic gliosis in the deep cerebral white matter. Mild for age
volume loss. Small calcifications along the right cerebral
convexity, unchanged. These could be vascular or
subarachnoid/inflammatory.

Vascular: Atherosclerotic calcification.  No hyperdense vessel.

Skull: No acute or aggressive finding

Sinuses/Orbits: Bilateral cataract resection

Other: These results were communicated to [facility] at [DATE] pmon
01/05/2019by text page via the [employer] messaging system.

ASPECTS (Alberta Stroke Program Early CT Score)

- Ganglionic level infarction (caudate, lentiform nuclei, internal
capsule, insula, M1-M3 cortex): 7

- Supraganglionic infarction (M4-M6 cortex): 3

Total score (0-10 with 10 being normal): 10
IMPRESSION: 1. No acute finding.  ASPECTS is 10.
2. Atrophy and chronic small vessel ischemia.

## 2020-09-02 ENCOUNTER — Other Ambulatory Visit: Payer: Self-pay

## 2020-09-02 ENCOUNTER — Other Ambulatory Visit (INDEPENDENT_AMBULATORY_CARE_PROVIDER_SITE_OTHER): Payer: Medicare Other

## 2020-09-02 DIAGNOSIS — E1165 Type 2 diabetes mellitus with hyperglycemia: Secondary | ICD-10-CM

## 2020-09-02 DIAGNOSIS — Z794 Long term (current) use of insulin: Secondary | ICD-10-CM | POA: Diagnosis not present

## 2020-09-02 LAB — GLUCOSE, RANDOM: Glucose, Bld: 105 mg/dL — ABNORMAL HIGH (ref 70–99)

## 2020-09-02 LAB — HEMOGLOBIN A1C: Hgb A1c MFr Bld: 6.4 % (ref 4.6–6.5)

## 2020-09-07 ENCOUNTER — Ambulatory Visit (INDEPENDENT_AMBULATORY_CARE_PROVIDER_SITE_OTHER): Payer: Medicare Other | Admitting: Endocrinology

## 2020-09-07 ENCOUNTER — Encounter: Payer: Self-pay | Admitting: Endocrinology

## 2020-09-07 ENCOUNTER — Other Ambulatory Visit: Payer: Self-pay

## 2020-09-07 VITALS — BP 130/62 | HR 74 | Ht 76.0 in | Wt 268.0 lb

## 2020-09-07 DIAGNOSIS — E1165 Type 2 diabetes mellitus with hyperglycemia: Secondary | ICD-10-CM

## 2020-09-07 DIAGNOSIS — E782 Mixed hyperlipidemia: Secondary | ICD-10-CM | POA: Diagnosis not present

## 2020-09-07 DIAGNOSIS — Z794 Long term (current) use of insulin: Secondary | ICD-10-CM

## 2020-09-07 NOTE — Patient Instructions (Signed)
Novolog average 8 units am and 6-8 at supper  [place] adjust based on am sugars  Victoza 1.2mg  today

## 2020-09-07 NOTE — Progress Notes (Signed)
Patient ID: David [name], male   DOB: Mar 27, 1941, 80 y.o.   MRN: [id number]    Reason for Appointment: Followup for Type 2 Diabetes   History of Present Illness:          Diagnosis: Type 2 diabetes mellitus, date of diagnosis:   1992       Past history:  He was initially treated with metformin and at some point also glipizide. Apparently he was started on insulin in 1994 approximately because of poor control He has been on various insulin regimens over the last several years However even with insulin he has had poor control for at least the last 7 or 8 years. He does not know what his previous A1c levels have been. He had been continued on metformin and glipizide but metformin stopped because of kidney function abnormality He had been taking Lantus 60 units twice a day with NovoLog previously and also Before his initial consultation in 6/15 he was on NovoLog twice a day and Humalog mix insulin  Because of poor control and large insulin doses he was started on Victoza in 6/15  Since 04/28/14 he had been on a Medtronic insulin pump because of persistent poor control and high insulin requirement  Recent history:   Non-insulin hypoglycemic drugs the patient is taking are: VICTOZA 1.8 mg daily  INSULIN regimen: Tresiba 40 units daily, NovoLog 10 units at brunch and  12 units at dinner   A1c is unchanged at 6.4  Fructosamine : 329 last  Current management, problems and blood sugar patterns:  His wife is usually adjusting his insulin doses  She is however adjusting the [place] at night based on his blood sugar at that time  Apparently now he is having a decreased appetite and generally eating smaller meals but this is variable  Today had 2 slices of toast this morning and subsequently had a serving of spaghetti and meatballs  Appears to be generally getting more carbohydrate in the morning than in the evening  Has fewer blood sugars in the last few days but does  have some tendency to low blood sugars in the afternoons and before dinner  Also occasionally has had low normal or low sugars early morning but not recently  His weight fluctuates based on his edema level  Meals 12p, 7 pm but not consistent   PRE-MEAL Fasting Lunch Dinner  overnight Overall  Glucose range:  69-227  53, 92  65-226  54, 120   Mean/median:  163  126  110  120   POST-MEAL PC Breakfast PC Lunch PC Dinner  Glucose range:    105  Mean/median:      Previously:  PRE-MEAL Fasting  midday  afternoon  evening Overall  Glucose range:  91-144  85-164  73-300  86-222 73-300  Mean/median:   127  146  150 139     Self-care:   Meals: 2- 3 meals per day.   breakfast is cheerios or egg and toast.  Will have half sandwich with soup at 1 pm , dinner at  6-7 pm.     Bedtime snack is usually crackers  with milk or have been admitted sandwich          Last  consultation : Most recent: 08/2017      Wt Readings from Last 3 Encounters:  09/07/20 268 lb (121.6 kg)  08/06/20 273 lb (123.8 kg)  07/27/20 269 lb 11.2 oz (122.3 kg)   Glycemic control:  Lab Results  Component Value Date   HGBA1C 6.4 09/02/2020   HGBA1C 6.5 03/30/2020   HGBA1C 6.9 (H) 12/25/2019   Lab Results  Component Value Date   MICROALBUR 7.3 (H) 03/05/2019   LDLCALC 73 01/06/2019   CREATININE 3.96 (H) 08/13/2020            Lab Results  Component Value Date   FRUCTOSAMINE 329 (H) 05/02/2019   FRUCTOSAMINE 295 (H) 12/28/2018   FRUCTOSAMINE 316 (H) 07/17/2018   FRUCTOSAMINE 319 (H) 12/27/2017     Other active problems: See review of systems   Allergies as of 09/07/2020      Reactions   Ativan [lorazepam] Anxiety, Other (See Comments)   Pt gets more agitated   Adhesive [tape] Other (See Comments)   blisters      Medication List       Accurate as of September 07, 2020  1:49 PM. If you have any questions, ask your nurse or doctor.        acetaminophen 650 MG CR tablet Commonly known as:  TYLENOL Take 650 mg by mouth every 8 (eight) hours as needed for pain.   allopurinol 100 MG tablet Commonly known as: ZYLOPRIM Take 100 mg by mouth daily.   amLODipine 5 MG tablet Commonly known as: NORVASC TAKE 1 TABLET BY MOUTH EVERY DAY   atorvastatin 10 MG tablet Commonly known as: LIPITOR TAKE 1 TABLET BY MOUTH EVERY DAY AT 6PM   B-complex with vitamin C tablet Take 1 tablet by mouth daily.   blood glucose meter kit and supplies Kit Dispense based on patient and insurance preference. Use up to four times daily as directed. (FOR ICD-9 250.00, 250.01).   calcitRIOL 0.25 MCG capsule Commonly known as: ROCALTROL Take 1 capsule by mouth once daily   carvedilol 25 MG tablet Commonly known as: COREG TAKE 1 TABLET BY MOUTH TWICE DAILY WITH A MEAL   cetirizine 10 MG tablet Commonly known as: ZYRTEC Take 10 mg by mouth daily.   clopidogrel 75 MG tablet Commonly known as: PLAVIX TAKE 1 TABLET BY MOUTH EVERY DAY   EPOGEN IJ Inject as directed See admin instructions. Every 4 weeks   Fish Oil 1000 MG Caps Take 1 capsule (1,000 mg total) by mouth every morning. What changed: how much to take   folic acid 1 MG tablet Commonly known as: FOLVITE Take 1 mg by mouth daily.   glucose blood test strip Use Contour test strips as instructed to check blood sugar four times daily.   hydrALAZINE 100 MG tablet Commonly known as: APRESOLINE TAKE 1 TABLET BY MOUTH TWICE A DAY   INSULIN SYRINGE 1CC/31GX5/16" 31G X 5/16" 1 ML Misc 1 each by Does not apply route 3 (three) times daily. Use insulin syringe to inject insulin three times daily.   isosorbide mononitrate 30 MG 24 hr tablet Commonly known as: IMDUR TAKE 2 TABLETS DAILY. PLEASE MAKE OVERDUE APPT WITH DR. Caryl Comes BEFORE ANYMORE REFILLS. 1ST ATTEMPT What changed: See the new instructions.   NovoLOG FlexPen 100 UNIT/ML FlexPen Generic drug: insulin aspart Inject 10-12 Units into the skin See admin instructions. Inject 10  units subcutaneously before breakfast and 12 units before supper What changed: additional instructions   pantoprazole 40 MG tablet Commonly known as: PROTONIX TAKE 1 TABLET BY MOUTH EVERY DAY   PRESCRIPTION MEDICATION Place into the left eye See admin instructions. Injections at [facility] once every 4 weeks - left eye for diabetic retinopathy   sertraline 100 MG  tablet Commonly known as: ZOLOFT TAKE 1 TABLET BY MOUTH EVERY DAY   sodium bicarbonate 650 MG tablet Take 2 tablets (1,300 mg total) by mouth 2 (two) times daily.   torsemide 20 MG tablet Commonly known as: DEMADEX Take 1 tablet (20 mg total) by mouth 2 (two) times daily. Can take extra 72m daily for weight gain or increased swelling What changed:   how much to take  when to take this  additional instructions   traMADol 50 MG tablet Commonly known as: Ultram Take 1 tablet (50 mg total) by mouth every 6 (six) hours as needed.   TTyler AasFlexTouch 200 UNIT/ML FlexTouch Pen Generic drug: insulin degludec INJECT 44 UNITS SUBCUTANEOUSLY ONCE DAILY What changed: See the new instructions.   Victoza 18 MG/3ML Sopn Generic drug: liraglutide Inject 1.8 mg into the skin daily before supper.   vitamin B-12 100 MCG tablet Commonly known as: CYANOCOBALAMIN Take 100 mcg by mouth daily.       Allergies:  Allergies  Allergen Reactions  . Ativan [Lorazepam] Anxiety and Other (See Comments)    Pt gets more agitated  . Adhesive [Tape] Other (See Comments)    blisters    Past Medical History:  Diagnosis Date  . Acute cholecystitis s/p lap cholecystectomy 03/05/2017 03/04/2017  . Anemia, iron deficiency   . Anxiety   . Arthritis   . Atrial fibrillation with rapid ventricular response ([facility]   . BPH (benign prostatic hypertrophy)   . CAD (coronary artery disease)    Nonobstructive CAD per cath  . Cardiac pacemaker in situ   . CHF (congestive heart failure) ([facility]   . Chronic ulcer of right foot ([facility]     resolved per patient  . CKD (chronic kidney disease) stage 4, GFR 15-29 ml/min (HCC) 09/26/2017  . CKD (chronic kidney disease), stage III ([facility] secondary to DM and HTN   nephrologist-  [name]  . Coronary artery disease involving native coronary artery of native heart without angina pectoris   . Depression   . Diastolic dysfunction   . Dyspnea    with exertion, no oxygen  . 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 Hearing loss    wears hearing aids  . History of cellulitis    right great toe 10-25-2014  . History of skin cancer   . HOCM (hypertrophic obstructive cardiomyopathy) ([facility]   . Hyperlipidemia associated with type 2 diabetes mellitus ([facility] 10/25/2013  . Hypertension   . Hypertension associated with diabetes ([facility] 10/25/2013  . 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 Insulin dependent type 2 diabetes mellitus ([facility] 1991   followd by dr kDwyane Dee-  has insulin pump  . Insulin pump in place    Patient does not have insulin pump - removed approx 2 yrs ago 2019  . Memory loss    mild per patient  . OSA on CPAP   . Pacemaker 06/03/2015   Medtronic  . Peripheral neuropathy    severe - feet/lower legs  . Peripheral vascular disease ([facility]    bilateral lower extremities  . PVD (peripheral vascular disease) ([facility]   . Rib fracture 07/24/2015  . Seasonal and perennial allergic rhinitis 10/25/2013  . Seborrheic keratosis 09/19/2016  . Secondary hyperparathyroidism of renal origin ([facility]   . 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 Sinus node dysfunction (HCC)   . Sleep apnea   . Syncope 07/24/2015  . Walker as ambulation aid  Past Surgical History:  Procedure Laterality Date  . AMPUTATION OF REPLICATED TOES  Mar 7681   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)  . AMPUTATION TOE Right 03/12/2015   Procedure: RIGHT HALLUS AMPUTATION ;  Surgeon: [name], MD;  Location: [place];  Service: Podiatry;   Laterality: Right;  . AV FISTULA PLACEMENT Left 04/16/2020   Procedure: CREATION OF LEFT Brachial/Cephalic Arteriovenous Fistula.;  Surgeon: Cherre Robins, MD;  Location: [place];  Service: Vascular;  Laterality: Left;  . BASCILIC VEIN TRANSPOSITION Left 08/06/2020   Procedure: BRANCH LIGATION OF LEFT UPPER EXTREMITY ARTERIOVENOUS FISTULA;  Surgeon: Cherre Robins, MD;  Location: [place];  Service: Vascular;  Laterality: Left;  . CARDIAC CATHETERIZATION  11-25-2010   [place], [place]   Nonobstructive CAD  . CARDIAC PACEMAKER PLACEMENT  Nov 2009   Medtronic  . CHOLECYSTECTOMY N/A 03/05/2017   Procedure: LAPAROSCOPIC CHOLECYSTECTOMY WITH INTRAOPERATIVE CHOLANGIOGRAM;  Surgeon: [name], MD;  Location[facility];  Service: General;  Laterality: N/A;  . EP IMPLANTABLE DEVICE N/A 06/03/2015   Procedure: PPM Generator Changeout;  Surgeon: [name], MD;  Location: [place] [facility];  Service: Cardiovascular;  Laterality: N/A;  . EXCISION BONE CYST Right 03/06/2015   Procedure: BONE BIOPSIES OF RIGHT FOOT;  Surgeon: [name], MD;  Location: [place];  Service: Podiatry;  Laterality: Right;  . EYE SURGERY Bilateral   . KNEE ARTHROSCOPY    . ORIF ANKLE FRACTURE Left 11/06/2014   Procedure: OPEN REDUCTION INTERNAL FIXATION (ORIF) LEFT  ANKLE FRACTURE;  Surgeon: [name], MD;  Location: [facility];  Service: Orthopedics;  Laterality: Left;  . TEE WITHOUT CARDIOVERSION N/A 09/01/2017   Procedure: TRANSESOPHAGEAL ECHOCARDIOGRAM (TEE);  Surgeon: [name], MD;  [facility];  Service: Cardiovascular;  Laterality: N/A;  . TOTAL KNEE ARTHROPLASTY     patient denies this surgery  . VEIN LIGATION AND STRIPPING      Family History  Problem Relation Age of Onset  . Cancer Mother        breast  . Heart attack Father   . Stroke Father     Social History:  reports that he quit smoking about 36 years ago. He has a 60.00 pack-year smoking history. He  has never used smokeless tobacco. He reports current alcohol use of about 1.0 standard drink of alcohol per week. He reports that he does not use drugs.    Review of Systems         Lipids: Had been on and off Lipitor since late 2014 and is now taking 10 mg.   LDL is below 70 Previously had high ALT with Lipitor but now doing well with 10 mg prescribed by his PCP  HDL is consistently low  He is taking OTC  fish oil  Also taking cholestyramine for other reasons and triglycerides are relatively high   Lab Results  Component Value Date   CHOL 118 12/25/2019   CHOL 108 03/01/2019   CHOL 138 01/06/2019   Lab Results  Component Value Date   HDL 23.60 (L) 12/25/2019   HDL 22.10 (L) 03/01/2019   HDL 23 (L) 01/06/2019   Lab Results  Component Value Date   LDLCALC 73 01/06/2019   LDLCALC 62 07/17/2018   LDLCALC 106 (H) 08/31/2017   Lab Results  Component Value Date   TRIG 217.0 (H) 12/25/2019   TRIG 255.0 (H) 03/01/2019   TRIG 210 (H) 01/06/2019   Lab Results  Component Value Date   CHOLHDL 5 12/25/2019   CHOLHDL 5 03/01/2019   CHOLHDL 6.0 01/06/2019   Lab Results  Component Value Date   LDLDIRECT 45.0 12/25/2019   LDLDIRECT 37.0 03/01/2019   LDLDIRECT 50.0 12/27/2017    Lab Results  Component Value Date   ALT 8 04/22/2020       HYPERTENSION: The blood pressure has been treated with various drugs including carvedilol 25 mg by other physicians. Bp at home 140/60  BP Readings from Last 3 Encounters:  09/07/20 130/62  08/13/20 (!) 132/44  08/06/20 (!) 128/42     Chronic kidney disease: Has variable results, is followed by nephrologist and likely will be going for dialysis soon   Creatinine levels recently:  Lab Results  Component Value Date   CREATININE 3.96 (H) 08/13/2020   CREATININE 4.00 (H) 08/06/2020   CREATININE 3.35 (H) 07/08/2020         Has history of significant loss of sensation in his feet  since about 2013  He has been prescribed  diabetic shoes He is  under the care of a podiatrist, recently regular  Vitamin B12 deficiency: managed with monthly injections  Lab Results  Component Value Date   VITAMINB12 500 03/06/2019   Has anemia of kidney disease    Physical Examination:  BP 130/62   Pulse 74   Ht '6\' 4"'  (1.93 m)   Wt 268 lb (121.6 kg)   SpO2 98%   BMI 32.62 kg/m     ASSESSMENT:  Diabetes type 2, with obesity  See history of present illness for detailed discussion of current diabetes management, blood sugar patterns and problems identified  He is on basal bolus insulin injections and Victoza   A1c is 6.4  His home blood sugars averaging about 120 only His wife thinks that he is eating less although this does not appear consistent Sporadically will have low sugars but mostly in the late afternoon Also overnight blood sugars appear to be variable Previously had been on the Dexcom but likely not able to handle this on his own   PLAN:   He will try to check blood sugars more consistently by rotation at different times  Likely needs to cut back on his NovoLog, taking about 8 units instead of 10 in the morning and 6-8 at suppertime  Can take the insulin right after eating to better assess how much he has eaten  Take a stable dose of [place] and only adjust if morning sugars are out of range consistently  For now reduced Victoza to 1.2 mg  Will reassess his insulin doses in 2 months since he may be on dialysis by the  He will try to make sure he has some protein in the morning and not just eat bread by itself  Discussed balancing meals and sources of protein  If morning sugars are relatively lower he can cut back on Tresiba another 2 to 4 units   Patient Instructions  Novolog average 8 units am and 6-8 at supper  [place] adjust based on am sugars  Victoza 1.18m today     Total visit time including counseling = 30 minutes    AElayne Snare4/25/2022, 1:49 PM   Note: This  office note was prepared with Dragon voice recognition system technology. Any transcriptional errors that result from this process are unintentional.

## 2020-09-08 ENCOUNTER — Encounter: Payer: Self-pay | Admitting: Vascular Surgery

## 2020-09-08 ENCOUNTER — Ambulatory Visit (HOSPITAL_COMMUNITY)
Admission: RE | Admit: 2020-09-08 | Discharge: 2020-09-08 | Disposition: A | Discharge status: Home / Self Care | Payer: Medicare Other | Source: Ambulatory Visit | Attending: Vascular Surgery | Admitting: Vascular Surgery

## 2020-09-08 ENCOUNTER — Ambulatory Visit (INDEPENDENT_AMBULATORY_CARE_PROVIDER_SITE_OTHER): Payer: Self-pay | Admitting: Vascular Surgery

## 2020-09-08 VITALS — BP 164/57 | HR 66 | Temp 97.5°F | Resp 20 | Ht 76.0 in | Wt 268.0 lb

## 2020-09-08 DIAGNOSIS — N184 Chronic kidney disease, stage 4 (severe): Secondary | ICD-10-CM | POA: Insufficient documentation

## 2020-09-08 NOTE — Progress Notes (Signed)
[facility] [place] PROGRESS NOTE  ASSESSMENT / PLAN: David [name] is a 80 y.o. male nearing ESRD. He is status post left brachiocephalic AVF 74/8/27. He underwent sidebranch ligation 08/06/20. His fistula is ready to use at any point. Follow up with me or PA in 6 months with repeat duplex, sooner if any issues at HD.  SUBJECTIVE: No complaints.   OBJECTIVE: BP (!) 164/57 (BP Location: Right Arm, Patient Position: Sitting, Cuff Size: Large)   Pulse 66   Temp (!) 97.5 F (36.4 C)   Resp 20   Ht 6\' 4"  (1.93 m)   Wt 268 lb (121.6 kg)   SpO2 95%   BMI 32.62 kg/m   Tortuous LUE AVF with strong thrill  CBC Latest Ref Rng & Units 08/13/2020 08/06/2020 07/08/2020  WBC 4.0 - 10.5 K/uL - - -  Hemoglobin 13.0 - 17.0 g/dL 9.0(L) 8.8(L) 9.8(L)  Hematocrit 39.0 - 52.0 % - 26.0(L) -  Platelets 150 - 400 K/uL - - -     CMP Latest Ref Rng & Units 09/02/2020 08/13/2020 08/06/2020  Glucose 70 - 99 mg/dL 105(H) 160(H) 126(H)  BUN 8 - 23 mg/dL - 97(H) 109(H)  Creatinine 0.61 - 1.24 mg/dL - 3.96(H) 4.00(H)  Sodium 135 - 145 mmol/L - 139 140  Potassium 3.5 - 5.1 mmol/L - 4.2 4.2  Chloride 98 - 111 mmol/L - 109 108  CO2 22 - 32 mmol/L - 20(L) -  Calcium 8.9 - 10.3 mg/dL - 9.1 -  Total Protein 6.5 - 8.1 g/dL - - -  Total Bilirubin 0.3 - 1.2 mg/dL - - -  Alkaline Phos 38 - 126 U/L - - -  AST 15 - 41 U/L - - -  ALT 0 - 44 U/L - - -    Sherby Moncayo N. Stanford Breed, MD [facility] Phone Number: [phone] 09/08/2020 4:08 PM

## 2020-09-10 ENCOUNTER — Other Ambulatory Visit: Payer: Self-pay | Admitting: Endocrinology

## 2020-09-10 ENCOUNTER — Encounter (HOSPITAL_COMMUNITY)
Admission: RE | Admit: 2020-09-10 | Discharge: 2020-09-10 | Disposition: A | Discharge status: Home / Self Care | Payer: Medicare Other | Source: Ambulatory Visit | Attending: Nephrology | Admitting: Nephrology

## 2020-09-10 ENCOUNTER — Other Ambulatory Visit: Payer: Self-pay

## 2020-09-10 VITALS — BP 153/50 | HR 62 | Temp 97.3°F | Resp 20

## 2020-09-10 DIAGNOSIS — D631 Anemia in chronic kidney disease: Secondary | ICD-10-CM | POA: Diagnosis not present

## 2020-09-10 DIAGNOSIS — N184 Chronic kidney disease, stage 4 (severe): Secondary | ICD-10-CM | POA: Insufficient documentation

## 2020-09-10 LAB — RENAL FUNCTION PANEL
Albumin: 3.2 g/dL — ABNORMAL LOW (ref 3.5–5.0)
Anion gap: 12 (ref 5–15)
BUN: 88 mg/dL — ABNORMAL HIGH (ref 8–23)
CO2: 19 mmol/L — ABNORMAL LOW (ref 22–32)
Calcium: 9 mg/dL (ref 8.9–10.3)
Chloride: 109 mmol/L (ref 98–111)
Creatinine, Ser: 3.64 mg/dL — ABNORMAL HIGH (ref 0.61–1.24)
GFR, Estimated: 16 mL/min — ABNORMAL LOW (ref >60)
Glucose, Bld: 163 mg/dL — ABNORMAL HIGH (ref 70–99)
Phosphorus: 4.7 mg/dL — ABNORMAL HIGH (ref 2.5–4.6)
Potassium: 3.9 mmol/L (ref 3.5–5.1)
Sodium: 140 mmol/L (ref 135–145)

## 2020-09-10 LAB — POCT HEMOGLOBIN-HEMACUE: Hemoglobin: 9.3 g/dL — ABNORMAL LOW (ref 13.0–17.0)

## 2020-09-10 MED ORDER — DARBEPOETIN ALFA 60 MCG/0.3ML IJ SOSY
PREFILLED_SYRINGE | INTRAMUSCULAR | Status: AC
  Administered 2020-09-10: 60 ug via SUBCUTANEOUS
  Filled 2020-09-10: qty 0.3

## 2020-09-10 MED ORDER — DARBEPOETIN ALFA 60 MCG/0.3ML IJ SOSY: 60.0000 ug | PREFILLED_SYRINGE | INTRAMUSCULAR | Status: DC

## 2020-09-18 NOTE — Addendum Note (Signed)
Addended by: Freada Bergeron on: 09/18/2020 05:53 PM   Modules accepted: Orders

## 2020-09-18 NOTE — Telephone Encounter (Signed)
Order placed to [employer] [facility] via community message. 

## 2020-09-18 NOTE — Telephone Encounter (Signed)
Per dr Radford Pax ORder ResMed auto CPAP from 4 to 15cm H2O with heated humidity and mask of choice and followup in 6 weeks

## 2020-09-18 NOTE — Telephone Encounter (Signed)
Patient calling for a new prescription for cpap because the motor has exceeded its life.

## 2020-09-24 DIAGNOSIS — N2581 Secondary hyperparathyroidism of renal origin: Secondary | ICD-10-CM | POA: Diagnosis not present

## 2020-09-24 DIAGNOSIS — Z8674 Personal history of sudden cardiac arrest: Secondary | ICD-10-CM | POA: Diagnosis not present

## 2020-09-24 DIAGNOSIS — E1122 Type 2 diabetes mellitus with diabetic chronic kidney disease: Secondary | ICD-10-CM | POA: Diagnosis not present

## 2020-09-24 DIAGNOSIS — D509 Iron deficiency anemia, unspecified: Secondary | ICD-10-CM | POA: Diagnosis not present

## 2020-09-24 DIAGNOSIS — I129 Hypertensive chronic kidney disease with stage 1 through stage 4 chronic kidney disease, or unspecified chronic kidney disease: Secondary | ICD-10-CM | POA: Diagnosis not present

## 2020-09-24 DIAGNOSIS — N184 Chronic kidney disease, stage 4 (severe): Secondary | ICD-10-CM | POA: Diagnosis not present

## 2020-09-24 DIAGNOSIS — I770 Arteriovenous fistula, acquired: Secondary | ICD-10-CM | POA: Diagnosis not present

## 2020-09-30 ENCOUNTER — Ambulatory Visit (INDEPENDENT_AMBULATORY_CARE_PROVIDER_SITE_OTHER): Payer: Medicare Other | Admitting: Podiatry

## 2020-09-30 ENCOUNTER — Encounter: Payer: Self-pay | Admitting: Podiatry

## 2020-09-30 ENCOUNTER — Other Ambulatory Visit: Payer: Self-pay

## 2020-09-30 DIAGNOSIS — E1151 Type 2 diabetes mellitus with diabetic peripheral angiopathy without gangrene: Secondary | ICD-10-CM | POA: Diagnosis not present

## 2020-09-30 DIAGNOSIS — B351 Tinea unguium: Secondary | ICD-10-CM

## 2020-09-30 DIAGNOSIS — Z89411 Acquired absence of right great toe: Secondary | ICD-10-CM

## 2020-09-30 DIAGNOSIS — Z89421 Acquired absence of other right toe(s): Secondary | ICD-10-CM | POA: Diagnosis not present

## 2020-10-04 NOTE — Progress Notes (Signed)
  Subjective:  Patient ID: David [name], male    DOB: 08-Sep-1940,  MRN: [id number]  80 y.o. male presents with at risk foot care. Patient has h/o amputation of digital amputation R hallux and R 2nd toe and thick, elongated toenails b/l lower extremities which are tender when wearing enclosed shoe gear..    Patient's blood sugar was 60 mg/dl today.  His wife is present during today's visit. Wife states [name] has now been placed on medication to control his gout.  PCP: David Barrack, MD and last visit was: 03/24/2020. He is also followed by David [name] for his diabetes. Last visit was 09/07/2020.  Review of Systems: Negative except as noted in the HPI.   Allergies  Allergen Reactions  . Ativan [Lorazepam] Anxiety and Other (See Comments)    Pt gets more agitated  . Adhesive [Tape] Other (See Comments)    blisters    Objective:  There were no vitals filed for this visit. Constitutional Patient is a pleasant 80 y.o. Caucasian male morbidly obese in NAD. AAO x 3.  Vascular Capillary refill time to remaining digits delayed b/l lower extremities. Palpable pedal pulses b/l LE. Pedal hair present. Lower extremity skin temperature gradient within normal limits. No cyanosis or clubbing noted.  Neurologic Normal speech. Protective sensation diminished with 10g monofilament b/l.  Dermatologic Pedal skin with normal turgor, texture and tone bilaterally. No open wounds bilaterally. No interdigital macerations bilaterally. Toenails 1-5 left, R 3rd toe, R 4th toe and R 5th toe elongated, discolored, dystrophic, thickened, and crumbly with subungual debris and tenderness to dorsal palpation.  Orthopedic: Normal muscle strength 5/5 to all lower extremity muscle groups bilaterally. No pain crepitus or joint limitation noted with ROM b/l. Lower extremity amputation(s): digital amputation R hallux and R 2nd toe.   Hemoglobin A1C Latest Ref Rng & Units 09/02/2020 03/30/2020 12/25/2019  HGBA1C 4.6 -  6.5 % 6.4 6.5 6.9(H)  Some recent data might be hidden   Assessment:   1. Onychomycosis   2. Status post amputation of right great toe (David [name])   3. Status post amputation of lesser toe of right foot (David [name])   4. Type II diabetes mellitus with peripheral circulatory disorder [facility])    Plan:  Patient was evaluated and treated and all questions answered.  Onychomycosis with pain -Nails palliatively debridement as below. -Educated on self-care  Procedure: Nail Debridement Rationale: Pain Type of Debridement: manual, sharp debridement. Instrumentation: Nail nipper, rotary burr. Number of Nails: 8  -Examined patient. -Continue diabetic foot care principles. -Patient to continue soft, supportive shoe gear daily. -Toenails 1-5 left, R 3rd toe, R 4th toe and R 5th toe debrided in length and girth without iatrogenic bleeding with sterile nail nipper and dremel.  -Patient to report any pedal injuries to medical professional immediately. -Patient/POA to call should there be question/concern in the interim.  Return in about 3 months (around 12/31/2020).  Marzetta Board, DPM

## 2020-10-08 ENCOUNTER — Other Ambulatory Visit: Payer: Self-pay

## 2020-10-08 ENCOUNTER — Ambulatory Visit (HOSPITAL_COMMUNITY)
Admission: RE | Admit: 2020-10-08 | Discharge: 2020-10-08 | Disposition: A | Discharge status: Home / Self Care | Payer: Medicare Other | Source: Ambulatory Visit | Attending: Nephrology | Admitting: Nephrology

## 2020-10-08 VITALS — BP 130/29 | HR 59 | Temp 97.3°F | Resp 20

## 2020-10-08 DIAGNOSIS — N184 Chronic kidney disease, stage 4 (severe): Secondary | ICD-10-CM | POA: Diagnosis not present

## 2020-10-08 LAB — RENAL FUNCTION PANEL
Albumin: 3.2 g/dL — ABNORMAL LOW (ref 3.5–5.0)
Anion gap: 11 (ref 5–15)
BUN: 94 mg/dL — ABNORMAL HIGH (ref 8–23)
CO2: 19 mmol/L — ABNORMAL LOW (ref 22–32)
Calcium: 9.2 mg/dL (ref 8.9–10.3)
Chloride: 109 mmol/L (ref 98–111)
Creatinine, Ser: 4.65 mg/dL — ABNORMAL HIGH (ref 0.61–1.24)
GFR, Estimated: 12 mL/min — ABNORMAL LOW (ref >60)
Glucose, Bld: 180 mg/dL — ABNORMAL HIGH (ref 70–99)
Phosphorus: 5.3 mg/dL — ABNORMAL HIGH (ref 2.5–4.6)
Potassium: 4.4 mmol/L (ref 3.5–5.1)
Sodium: 139 mmol/L (ref 135–145)

## 2020-10-08 LAB — IRON AND TIBC
Iron: 71 ug/dL (ref 45–182)
Saturation Ratios: 34 % (ref 17.9–39.5)
TIBC: 210 ug/dL — ABNORMAL LOW (ref 250–450)
UIBC: 139 ug/dL

## 2020-10-08 LAB — POCT HEMOGLOBIN-HEMACUE: Hemoglobin: 9.3 g/dL — ABNORMAL LOW (ref 13.0–17.0)

## 2020-10-08 LAB — FERRITIN: Ferritin: 504 ng/mL — ABNORMAL HIGH (ref 24–336)

## 2020-10-08 MED ORDER — DARBEPOETIN ALFA 60 MCG/0.3ML IJ SOSY
PREFILLED_SYRINGE | INTRAMUSCULAR | Status: AC
  Administered 2020-10-08: 60 ug via SUBCUTANEOUS
  Filled 2020-10-08: qty 0.3

## 2020-10-08 MED ORDER — DARBEPOETIN ALFA 60 MCG/0.3ML IJ SOSY: 60.0000 ug | PREFILLED_SYRINGE | INTRAMUSCULAR | Status: DC

## 2020-10-13 ENCOUNTER — Ambulatory Visit (INDEPENDENT_AMBULATORY_CARE_PROVIDER_SITE_OTHER): Payer: Medicare Other | Admitting: Internal Medicine

## 2020-10-13 ENCOUNTER — Other Ambulatory Visit: Payer: Self-pay

## 2020-10-13 ENCOUNTER — Encounter: Payer: Self-pay | Admitting: Internal Medicine

## 2020-10-13 VITALS — BP 151/48 | HR 61 | Ht 76.0 in | Wt 262.6 lb

## 2020-10-13 DIAGNOSIS — Z950 Presence of cardiac pacemaker: Secondary | ICD-10-CM | POA: Diagnosis not present

## 2020-10-13 DIAGNOSIS — I495 Sick sinus syndrome: Secondary | ICD-10-CM | POA: Diagnosis not present

## 2020-10-13 DIAGNOSIS — I421 Obstructive hypertrophic cardiomyopathy: Secondary | ICD-10-CM

## 2020-10-13 NOTE — Patient Instructions (Signed)
Medication Instructions:  Your physician recommends that you continue on your current medications as directed. Please refer to the Current Medication list given to you today.  *If you need a refill on your cardiac medications before your next appointment, please call your pharmacy*   Lab Work: None ordered.  If you have labs (blood work) drawn today and your tests are completely normal, you will receive your results only by: . MyChart Message (if you have MyChart) OR . A paper copy in the mail If you have any lab test that is abnormal or we need to change your treatment, we will call you to review the results.   Testing/Procedures: None ordered.    Follow-Up: At [facility], you and your health needs are our priority.  As part of our continuing mission to provide you with exceptional heart care, we have created designated Provider Care Teams.  These Care Teams include your primary Cardiologist (physician) and Advanced Practice Providers (APPs -  Physician Assistants and Nurse Practitioners) who all work together to provide you with the care you need, when you need it.  We recommend signing up for the patient portal called "MyChart".  Sign up information is provided on this After Visit Summary.  MyChart is used to connect with patients for Virtual Visits (Telemedicine).  Patients are able to view lab/test results, encounter notes, upcoming appointments, etc.  Non-urgent messages can be sent to your provider as well.   To learn more about what you can do with MyChart, go to [email or web address].    Your next appointment:   12 month(s)  The format for your next appointment:   In Person  Provider:   Steven Klein, MD     

## 2020-10-13 NOTE — Progress Notes (Signed)
Patient Care Team: [name], MD as PCP - General (Family Medicine) Sueanne Margarita, MD as PCP - Sleep Medicine (Cardiology) [name], MD as Consulting Physician (Nephrology) Elayne Snare, MD as Consulting Physician (Endocrinology) [name], MD as Consulting Physician (Cardiology) Marzetta Board, DPM as Consulting Physician (Podiatry) [place], [place], [place] as Consulting Physician (Optometry) Frann Rider, NP as Nurse Practitioner (Neurology) Madelin Rear, [facility] as Pharmacist (Pharmacist)   HPI  David [name] is a 80 y.o. male Seen in followup for pacemaker implanted for sinus node dysfunction and dyspnea on exertion. He has been diagnosed with hypertrophic cardiomyopathy following review of his films by Dr. Farrel Conners at [facility] exhibited stable. He denies chest pain.  At his last visit, because of significant shortness of breath, we have reprogrammed his rate response down.  He is also had problems with edema and this is been managed by renal   Echocardiogram 6/15 normal LV function and severe left ventricular hypertrophy with left atrial enlargement consistent with hypertensive heart disease  Evaluation [place] included a Myoview that was false positive catheterizations were undertaken in 2009, then, 2012 demonstrating 60% of the RCA, 70% in 240% circumflex and 60% OM1 40% LAD.    Genetic evaluation with Dr. Broadus John demonstrated no pathogenic HCM mutations    The patient denies chest pain,nocturnal dyspnea,    There have been no palpitations, lightheadedness or syncopeOnly can tolerate walking a  short distance, 2/2  exertional SOB also   He is sleeping more frequently. Currently waiting for a new CPAP machine.   Renal Failure. Torsemide for fluid retention-managed by his  Nephrologist; he is approaching dialysis  Wife's mother recently died from an overdose.   DATE TEST EF   8/20 Echo  55-60 %   12/21 Echo 55-60%     Date Cr K Hgb  3/18  2.45 5.4   5/18 1.9 4.9   12/18 2.16 4.4 9.6  3/22 3.96 4.2 9.0  4/22 3.64 3.9 9.3  5/22 4.65 4.4 9.3    Past Medical History:  Diagnosis Date  . Acute cholecystitis s/p lap cholecystectomy 03/05/2017 03/04/2017  . Anemia, iron deficiency   . Anxiety   . Arthritis   . Atrial fibrillation with rapid ventricular response ([place])   . BPH (benign prostatic hypertrophy)   . CAD (coronary artery disease)    Nonobstructive CAD per cath  . Cardiac pacemaker in situ   . CHF (congestive heart failure) ([place])   . Chronic ulcer of right foot ([place])    resolved per patient  . CKD (chronic kidney disease) stage 4, GFR 15-29 ml/min (HCC) 09/26/2017  . CKD (chronic kidney disease), stage III ([place]) secondary to DM and HTN   nephrologist-  [name]  . Coronary artery disease involving native coronary artery of native heart without angina pectoris   . Depression   . Diastolic dysfunction   . Dyspnea    with exertion, no oxygen  . 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 Hearing loss    wears hearing aids  . History of cellulitis    right great toe 10-25-2014  . History of skin cancer   . HOCM (hypertrophic obstructive cardiomyopathy) ([place])   . Hyperlipidemia associated with type 2 diabetes mellitus ([place]) 10/25/2013  . Hypertension   . Hypertension associated with diabetes ([place]) 10/25/2013  . 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 Insulin dependent type  2 diabetes mellitus ([place]) 1991   followd by [name]--  has insulin pump  . Insulin pump in place    Patient does not have insulin pump - removed approx 2 yrs ago 2019  . Memory loss    mild per patient  . OSA on CPAP   . Pacemaker 06/03/2015   Medtronic  . Peripheral neuropathy    severe - feet/lower legs  . Peripheral vascular disease ([place])    bilateral lower extremities  . PVD (peripheral vascular disease) ([place])   . Rib fracture 07/24/2015  . Seasonal and  perennial allergic rhinitis 10/25/2013  . Seborrheic keratosis 09/19/2016  . Secondary hyperparathyroidism of renal origin ([place])   . 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 Sinus node dysfunction (HCC)   . Sleep apnea   . Syncope 07/24/2015  . Walker as ambulation aid     Past Surgical History:  Procedure Laterality Date  . AMPUTATION OF REPLICATED TOES  Mar 8756   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)  . AMPUTATION TOE Right 03/12/2015   Procedure: RIGHT HALLUS AMPUTATION ;  Surgeon: [name], MD;  Location: [facility];  Service: Podiatry;  Laterality: Right;  . AV FISTULA PLACEMENT Left 04/16/2020   Procedure: CREATION OF LEFT Brachial/Cephalic Arteriovenous Fistula.;  Surgeon: Cherre Robins, MD;  Location: [facility];  Service: Vascular;  Laterality: Left;  . BASCILIC VEIN TRANSPOSITION Left 08/06/2020   Procedure: BRANCH LIGATION OF LEFT UPPER EXTREMITY ARTERIOVENOUS FISTULA;  Surgeon: Cherre Robins, MD;  Location: [facility];  Service: Vascular;  Laterality: Left;  . CARDIAC CATHETERIZATION  11-25-2010   [place], [place]   Nonobstructive CAD  . CARDIAC PACEMAKER PLACEMENT  Nov 2009   Medtronic  . CHOLECYSTECTOMY N/A 03/05/2017   Procedure: LAPAROSCOPIC CHOLECYSTECTOMY WITH INTRAOPERATIVE CHOLANGIOGRAM;  Surgeon: [name], MD;  Location: [facility];  Service: General;  Laterality: N/A;  . EP IMPLANTABLE DEVICE N/A 06/03/2015   Procedure: PPM Generator Changeout;  Surgeon: [name], MD;  Location: [facility];  Service: Cardiovascular;  Laterality: N/A;  . EXCISION BONE CYST Right 03/06/2015   Procedure: BONE BIOPSIES OF RIGHT FOOT;  Surgeon: [name], MD;  Location: [facility];  Service: Podiatry;  Laterality: Right;  . EYE SURGERY Bilateral   . KNEE ARTHROSCOPY    . ORIF ANKLE FRACTURE Left 11/06/2014   Procedure: OPEN REDUCTION INTERNAL FIXATION (ORIF) LEFT  ANKLE FRACTURE;  Surgeon: [name], MD;  Location: [facility];  Service:  Orthopedics;  Laterality: Left;  . TEE WITHOUT CARDIOVERSION N/A 09/01/2017   Procedure: TRANSESOPHAGEAL ECHOCARDIOGRAM (TEE);  Surgeon: [name], MD;  Location: [facility];  Service: Cardiovascular;  Laterality: N/A;  . TOTAL KNEE ARTHROPLASTY     patient denies this surgery  . VEIN LIGATION AND STRIPPING      Current Outpatient Medications  Medication Sig Dispense Refill  . acetaminophen (TYLENOL) 650 MG CR tablet Take 650 mg by mouth every 8 (eight) hours as needed for pain.    Marland Kitchen allopurinol (ZYLOPRIM) 100 MG tablet Take 100 mg by mouth daily.    Marland Kitchen amLODipine (NORVASC) 5 MG tablet TAKE 1 TABLET BY MOUTH EVERY DAY 90 tablet 1  . atorvastatin (LIPITOR) 10 MG tablet TAKE 1 TABLET BY MOUTH EVERY DAY AT 6PM 90 tablet 1  . B Complex-C (B-COMPLEX WITH VITAMIN C) tablet Take 1 tablet by mouth daily.    . blood glucose meter kit and supplies KIT Dispense based on patient and  insurance preference. Use up to four times daily as directed. (FOR ICD-9 250.00, 250.01). 1 each 0  . calcitRIOL (ROCALTROL) 0.25 MCG capsule Take 1 capsule by mouth once daily (Patient taking differently: Take 0.25 mcg by mouth daily.) 90 capsule 0  . carvedilol (COREG) 25 MG tablet TAKE 1 TABLET BY MOUTH TWICE DAILY WITH A MEAL 180 tablet 0  . cetirizine (ZYRTEC) 10 MG tablet Take 10 mg by mouth daily.    . clopidogrel (PLAVIX) 75 MG tablet TAKE 1 TABLET BY MOUTH EVERY DAY 90 tablet 0  . Epoetin Alfa (EPOGEN IJ) Inject as directed See admin instructions. Every 4 weeks    . folic acid (FOLVITE) 1 MG tablet Take 1 mg by mouth daily.    Marland Kitchen glucose blood test strip Use Contour test strips as instructed to check blood sugar four times daily. 200 each 2  . hydrALAZINE (APRESOLINE) 100 MG tablet TAKE 1 TABLET BY MOUTH TWICE A DAY 180 tablet 0  . insulin aspart (NOVOLOG FLEXPEN) 100 UNIT/ML FlexPen Inject 10-12 Units into the skin See admin instructions. Inject 10 units subcutaneously before breakfast and 12 units before supper  (Patient taking differently: Inject 10-12 Units into the skin See admin instructions. Inject 10 units at noon subcutaneously before and 12 units in the evening) 15 mL 5  . Insulin Syringe-Needle U-100 (INSULIN SYRINGE 1CC/31GX5/16") 31G X 5/16" 1 ML MISC 1 each by Does not apply route 3 (three) times daily. Use insulin syringe to inject insulin three times daily. 100 each 2  . isosorbide mononitrate (IMDUR) 30 MG 24 hr tablet TAKE 2 TABLETS DAILY. PLEASE MAKE OVERDUE APPT WITH DR. Caryl Comes BEFORE ANYMORE REFILLS. 1ST ATTEMPT (Patient taking differently: Take 60 mg by mouth daily.) 180 tablet 3  . Omega-3 Fatty Acids (FISH OIL) 1000 MG CAPS Take 1 capsule (1,000 mg total) by mouth every morning. (Patient taking differently: Take 2,400 mg by mouth every morning.) 30 capsule 1  . pantoprazole (PROTONIX) 40 MG tablet TAKE 1 TABLET BY MOUTH EVERY DAY 90 tablet 0  . PRESCRIPTION MEDICATION Place into the left eye See admin instructions. Injections at [facility] once every 4 weeks - left eye for diabetic retinopathy    . sertraline (ZOLOFT) 100 MG tablet TAKE 1 TABLET BY MOUTH EVERY DAY 90 tablet 1  . sodium bicarbonate 650 MG tablet Take 2 tablets (1,300 mg total) by mouth 2 (two) times daily.    Marland Kitchen torsemide (DEMADEX) 20 MG tablet Take 1 tablet (20 mg total) by mouth 2 (two) times daily. Can take extra 28m daily for weight gain or increased swelling (Patient taking differently: Take 20-40 mg by mouth See admin instructions. Take 40 mg in the morning and 20 mg in the evening)    . traMADol (ULTRAM) 50 MG tablet Take 1 tablet (50 mg total) by mouth every 6 (six) hours as needed. 8 tablet 0  . TRESIBA FLEXTOUCH 200 UNIT/ML FlexTouch Pen INJECT 44 UNITS SUBCUTANEOUSLY ONCE DAILY (Patient taking differently: Inject 44 Units into the skin daily before supper.) 9 mL 2  . VICTOZA 18 MG/3ML SOPN INJECT 1.8 MG INTO THE SKIN DAILY BEFORE SUPPER. 9 mL 1  . vitamin B-12 (CYANOCOBALAMIN) 100 MCG tablet Take 100 mcg by  mouth daily.     No current facility-administered medications for this visit.    Allergies  Allergen Reactions  . Ativan [Lorazepam] Anxiety and Other (See Comments)    Pt gets more agitated  . Adhesive [Tape] Other (See Comments)  blisters    Review of Systems negative except from HPI and PMH  Physical Exam BP (!) 151/48   Pulse 61   Ht _0  (1.93 m)   Wt 262 lb 9.6 oz (119.1 kg)   SpO2 96%   BMI 31.96 kg/m  Well developed and well nourished in no acute distress HENT normal Neck supple with JVP 8-10 Clear Device pocket well healed; without hematoma or erythema.  There is no tethering  Regular rate and rhythm, no gallop No murmur Abd-soft with active BS No Clubbing cyanosis 2-3  edema Skin-warm and dry A & Oriented  Grossly normal sensory and motor function  ECG a Pace 61 42/08/43  Assessment and  Plan Sinus node dysfunction  Pacemaker  Medtronic atrial paced unipolar  Renal dysfunction  Congestive Heart Failure-diastolic    Hypertension   Sleep apnea-treated    Diabetes  HCM  He is volume overloaded.  In the context of his diastolic heart failure therapies are constrained by his renal issues and so we will defer management of diuretics and blood pressure to them.  Exercise intolerance is a big issue.  His he is now chronotropically incompetent; we will reprogram his device to improve heart rate excursion and hopefully exercise performance.  Sleeping a lot.  He awaits his next CPAP machine.   I,Stephanie Williams,acting as a Education administrator for Virl Axe, MD.,have documented all relevant documentation on the behalf of Virl Axe, MD,as directed by  Virl Axe, MD while in the presence of Virl Axe, MD.   I, Virl Axe, MD, have reviewed all documentation for this visit. The documentation on 10/13/20 for the exam, diagnosis, procedures, and orders are all accurate and complete.

## 2020-11-05 ENCOUNTER — Ambulatory Visit (HOSPITAL_COMMUNITY)
Admission: RE | Admit: 2020-11-05 | Discharge: 2020-11-05 | Disposition: A | Discharge status: Home / Self Care | Payer: Medicare Other | Source: Ambulatory Visit | Attending: Nephrology | Admitting: Nephrology

## 2020-11-05 ENCOUNTER — Encounter: Payer: Self-pay | Admitting: Family

## 2020-11-05 ENCOUNTER — Other Ambulatory Visit: Payer: Self-pay

## 2020-11-05 ENCOUNTER — Ambulatory Visit (INDEPENDENT_AMBULATORY_CARE_PROVIDER_SITE_OTHER): Payer: Medicare Other | Admitting: Family

## 2020-11-05 VITALS — BP 116/47 | HR 71 | Temp 98.5°F

## 2020-11-05 VITALS — BP 150/60 | HR 71 | Temp 97.8°F | Ht 76.0 in | Wt 262.6 lb

## 2020-11-05 DIAGNOSIS — N184 Chronic kidney disease, stage 4 (severe): Secondary | ICD-10-CM

## 2020-11-05 DIAGNOSIS — R29898 Other symptoms and signs involving the musculoskeletal system: Secondary | ICD-10-CM | POA: Diagnosis not present

## 2020-11-05 DIAGNOSIS — M79642 Pain in left hand: Secondary | ICD-10-CM | POA: Diagnosis not present

## 2020-11-05 LAB — RENAL FUNCTION PANEL
Albumin: 3.2 g/dL — ABNORMAL LOW (ref 3.5–5.0)
Anion gap: 14 (ref 5–15)
BUN: 102 mg/dL — ABNORMAL HIGH (ref 8–23)
CO2: 17 mmol/L — ABNORMAL LOW (ref 22–32)
Calcium: 9 mg/dL (ref 8.9–10.3)
Chloride: 107 mmol/L (ref 98–111)
Creatinine, Ser: 4 mg/dL — ABNORMAL HIGH (ref 0.61–1.24)
GFR, Estimated: 14 mL/min — ABNORMAL LOW (ref >60)
Glucose, Bld: 187 mg/dL — ABNORMAL HIGH (ref 70–99)
Phosphorus: 5.8 mg/dL — ABNORMAL HIGH (ref 2.5–4.6)
Potassium: 4 mmol/L (ref 3.5–5.1)
Sodium: 138 mmol/L (ref 135–145)

## 2020-11-05 LAB — POCT HEMOGLOBIN-HEMACUE: Hemoglobin: 8.6 g/dL — ABNORMAL LOW (ref 13.0–17.0)

## 2020-11-05 MED ORDER — DARBEPOETIN ALFA 60 MCG/0.3ML IJ SOSY
PREFILLED_SYRINGE | INTRAMUSCULAR | Status: AC
  Filled 2020-11-05: qty 0.3

## 2020-11-05 MED ORDER — DARBEPOETIN ALFA 60 MCG/0.3ML IJ SOSY: 60.0000 ug | PREFILLED_SYRINGE | INTRAMUSCULAR | Status: DC

## 2020-11-06 ENCOUNTER — Other Ambulatory Visit: Payer: Self-pay | Admitting: Family

## 2020-11-06 DIAGNOSIS — Z9181 History of falling: Secondary | ICD-10-CM

## 2020-11-06 DIAGNOSIS — R29898 Other symptoms and signs involving the musculoskeletal system: Secondary | ICD-10-CM

## 2020-11-07 NOTE — Progress Notes (Signed)
Acute Office Visit  Subjective:    Patient ID: David [name], male    DOB: [birth year], 80 y.o.   MRN: [id number]  Chief Complaint  Patient presents with  . Hand Pain    Losing strength in arm after fistula surgery. He states that it is tender to the touch.    HPI Patient is in today with c/o left hand pain and weakness that has progressively gotten worse since March 2022 when he had a fistula placed through vascular surgery. He notified the surgeon who explained that this is potential side effect of the procedure. Reports having an ultrasound done that was normal. Patient is growing concerned that he is losing mobility. Wife has been giving him stress balls to exercise his hand with but he has not used them much. 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    Past Medical History:  Diagnosis Date  . Acute cholecystitis s/p lap cholecystectomy 03/05/2017 03/04/2017  . Anemia, iron deficiency   . Anxiety   . Arthritis   . Atrial fibrillation with rapid ventricular response ([place])   . BPH (benign prostatic hypertrophy)   . CAD (coronary artery disease)    Nonobstructive CAD per cath  . Cardiac pacemaker in situ   . CHF (congestive heart failure) ([place])   . Chronic ulcer of right foot ([place])    resolved per patient  . CKD (chronic kidney disease) stage 4, GFR 15-29 ml/min (HCC) 09/26/2017  . CKD (chronic kidney disease), stage III ([place]) secondary to DM and HTN   nephrologist-  [name]  . Coronary artery disease involving native coronary artery of native heart without angina pectoris   . Depression   . Diastolic dysfunction   . Dyspnea    with exertion, no oxygen  . 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 Hearing loss    wears hearing aids  . History of cellulitis    right great toe 10-25-2014  . History of skin cancer   . HOCM (hypertrophic obstructive cardiomyopathy) ([place])   . Hyperlipidemia associated with type 2 diabetes  mellitus ([place]) 10/25/2013  . Hypertension   . Hypertension associated with diabetes ([place]) 10/25/2013  . 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 Insulin pump in place    Patient does not have insulin pump - removed approx 2 yrs ago 2019  . Memory loss    mild per patient  . OSA on CPAP   . Pacemaker 06/03/2015   Medtronic  . Peripheral neuropathy    severe - feet/lower legs  . Peripheral vascular disease ([place])    bilateral lower extremities  . PVD (peripheral vascular disease) ([place])   . Rib fracture 07/24/2015  . Seasonal and perennial allergic rhinitis 10/25/2013  . Seborrheic keratosis 09/19/2016  . Secondary hyperparathyroidism of renal origin ([place])   . 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 Sinus node dysfunction (HCC)   . Sleep apnea   . Syncope 07/24/2015  . Walker as ambulation aid     Past Surgical History:  Procedure Laterality Date  . AMPUTATION OF REPLICATED TOES  Mar 4098   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)  . AMPUTATION TOE Right 03/12/2015   Procedure: RIGHT HALLUS AMPUTATION ;  Surgeon: [name], MD;  Location: [facility];  Service: Podiatry;  Laterality: Right;  .  AV FISTULA PLACEMENT Left 04/16/2020   Procedure: CREATION OF LEFT Brachial/Cephalic Arteriovenous Fistula.;  Surgeon: Cherre Robins, MD;  Location: [facility];  Service: Vascular;  Laterality: Left;  . BASCILIC VEIN TRANSPOSITION Left 08/06/2020   Procedure: BRANCH LIGATION OF LEFT UPPER EXTREMITY ARTERIOVENOUS FISTULA;  Surgeon: Cherre Robins, MD;  Location: [facility];  Service: Vascular;  Laterality: Left;  . CARDIAC CATHETERIZATION  11-25-2010   [place], [place]   Nonobstructive CAD  . CARDIAC PACEMAKER PLACEMENT  Nov 2009   Medtronic  . CHOLECYSTECTOMY N/A 03/05/2017   Procedure: LAPAROSCOPIC CHOLECYSTECTOMY WITH INTRAOPERATIVE CHOLANGIOGRAM;  Surgeon: [name], MD;  Location: [facility];  Service: General;  Laterality: N/A;  . EP IMPLANTABLE DEVICE N/A 06/03/2015   Procedure: PPM Generator Changeout;  Surgeon: [name], MD;  Location: [facility];  Service: Cardiovascular;  Laterality: N/A;  . EXCISION BONE CYST Right 03/06/2015   Procedure: BONE BIOPSIES OF RIGHT FOOT;  Surgeon: [name], MD;  Location: [facility];  Service: Podiatry;  Laterality: Right;  . EYE SURGERY Bilateral   . KNEE ARTHROSCOPY    . ORIF ANKLE FRACTURE Left 11/06/2014   Procedure: OPEN REDUCTION INTERNAL FIXATION (ORIF) LEFT  ANKLE FRACTURE;  Surgeon: [name], MD;  Location: [facility];  Service: Orthopedics;  Laterality: Left;  . TEE WITHOUT CARDIOVERSION N/A 09/01/2017   Procedure: TRANSESOPHAGEAL ECHOCARDIOGRAM (TEE);  Surgeon: [name], MD;  Location[facility];  Service: Cardiovascular;  Laterality: N/A;  . TOTAL KNEE ARTHROPLASTY     patient denies this surgery  . VEIN LIGATION AND STRIPPING      Family History  Problem Relation Age of Onset  . Cancer Mother        breast  . Heart attack Father   . Stroke Father     Social History   Socioeconomic History  . Marital status: Married    Spouse name: Not on file  . Number of children: 3  . Years of education: 38  . Highest education level: Not on file  Occupational History  . Occupation: Retired  Tobacco Use  . Smoking status: Former    Packs/day: 2.00    Years: 30.00    Pack years: 60.00    Types: Cigarettes    Quit date: 03/03/1984    Years since quitting: 36.7  . Smokeless tobacco: Never  Vaping Use  . Vaping Use: Never used  Substance and Sexual Activity  . Alcohol use: Yes    Alcohol/week: 1.0 standard drink    Types: 1 Glasses of wine per week    Comment: social 1-2 times/month  . Drug use: No  . Sexual activity: Not Currently  Other Topics Concern  . Not on file  Social History Narrative   Currently resides with his wife. 1 dog.  Fun/Hobby: Golf    Denies any religious beliefs effecting health care.    Social Determinants of Health   Financial Resource Strain: Low Risk   . Difficulty of Paying Living Expenses: Not hard at all  Food Insecurity: No Food Insecurity  . Worried About Charity fundraiser in the Last Year: Never true  . Ran Out of Food in the Last Year: Never true  Transportation Needs: No Transportation Needs  . Lack of Transportation (Medical): No  . Lack of Transportation (Non-Medical): No  Physical Activity: Insufficiently Active  . Days of Exercise per Week: 3 days  . Minutes of Exercise per Session: 20 min  Stress: No Stress Concern Present  . Feeling of Stress : Not at all  Social Connections: Socially Isolated  . Frequency of Communication with Friends and Family: Never  . Frequency of Social Gatherings with Friends and Family: Once a week  . Attends Religious Services: Never  . Active Member of Clubs or Organizations: No  . Attends Archivist Meetings: Never  . Marital Status: Married  Human resources officer Violence: Not At Risk  . Fear of Current or Ex-Partner: No  . Emotionally Abused: No  . Physically Abused: No  . Sexually Abused: No    Outpatient Medications Prior to Visit  Medication Sig Dispense Refill  . acetaminophen (TYLENOL) 650 MG CR tablet Take 650 mg by mouth every 8 (eight) hours as needed for pain.    Marland Kitchen allopurinol (ZYLOPRIM) 100 MG tablet Take 100 mg by mouth daily.    Marland Kitchen amLODipine (NORVASC) 5 MG tablet TAKE 1 TABLET BY MOUTH EVERY DAY 90 tablet 1  . atorvastatin (LIPITOR) 10 MG tablet TAKE 1 TABLET BY MOUTH EVERY DAY AT 6PM 90 tablet 1  . B Complex-C (B-COMPLEX WITH VITAMIN C) tablet Take 1 tablet by mouth daily.    . blood glucose meter kit and supplies KIT Dispense based on patient and insurance preference. Use up to four times daily as directed. (FOR ICD-9 250.00, 250.01). 1 each 0  . calcitRIOL (ROCALTROL) 0.25 MCG capsule Take 1 capsule by mouth once daily  90 capsule 0  . carvedilol (COREG) 25 MG tablet TAKE 1 TABLET BY MOUTH TWICE DAILY WITH A MEAL 180 tablet 0  . cetirizine (ZYRTEC) 10 MG tablet Take 10 mg by mouth daily.    . clopidogrel (PLAVIX) 75 MG tablet TAKE 1 TABLET BY MOUTH EVERY DAY 90 tablet 0  . Epoetin Alfa (EPOGEN IJ) Inject as directed See admin instructions. Every 4 weeks    . folic acid (FOLVITE) 1 MG tablet Take 1 mg by mouth daily.    Marland Kitchen glucose blood test strip Use Contour test strips as instructed to check blood sugar four times daily. 200 each 2  . hydrALAZINE (APRESOLINE) 100 MG tablet TAKE 1 TABLET BY MOUTH TWICE A DAY 180 tablet 0  . insulin aspart (NOVOLOG FLEXPEN) 100 UNIT/ML FlexPen Inject 10-12 Units into the skin See admin instructions. Inject 10 units subcutaneously before breakfast and 12 units before supper 15 mL 5  . Insulin Syringe-Needle U-100 (INSULIN SYRINGE 1CC/31GX5/16") 31G X 5/16" 1 ML MISC 1 each by Does not apply route 3 (three) times daily. Use insulin syringe to inject insulin three times daily. 100 each 2  . isosorbide mononitrate (IMDUR) 30 MG 24 hr tablet TAKE 2 TABLETS DAILY. PLEASE MAKE OVERDUE APPT WITH DR. Caryl Comes BEFORE ANYMORE REFILLS. 1ST ATTEMPT 180 tablet 3  . Omega-3 Fatty Acids (FISH OIL) 1000 MG CAPS Take 1 capsule (1,000 mg total) by mouth every morning. 30 capsule 1  . pantoprazole (PROTONIX) 40 MG tablet TAKE 1 TABLET BY MOUTH EVERY DAY 90 tablet 0  . PRESCRIPTION MEDICATION Place into the left eye See admin instructions. Injections at [facility] once every 4 weeks - left eye for diabetic retinopathy    . sertraline (ZOLOFT) 100 MG tablet TAKE 1 TABLET BY MOUTH EVERY DAY 90 tablet 1  . sodium bicarbonate 650 MG tablet Take 2 tablets (1,300 mg total) by mouth 2 (two) times daily.    Marland Kitchen torsemide (DEMADEX) 20 MG tablet Take 40 mg by mouth in the AM and 72m by  mouth in the PM    . traMADol (ULTRAM) 50 MG tablet Take 1 tablet (50 mg total) by mouth every 6 (six) hours as needed. 8 tablet  0  . TRESIBA FLEXTOUCH 200 UNIT/ML FlexTouch Pen INJECT 44 UNITS SUBCUTANEOUSLY ONCE DAILY 9 mL 2  . VICTOZA 18 MG/3ML SOPN INJECT 1.8 MG INTO THE SKIN DAILY BEFORE SUPPER. 9 mL 1  . vitamin B-12 (CYANOCOBALAMIN) 100 MCG tablet Take 100 mcg by mouth daily.    . Darbepoetin Alfa (ARANESP) 60 MCG/0.3ML injection     . Darbepoetin Alfa (ARANESP) injection 60 mcg      No facility-administered medications prior to visit.    Allergies  Allergen Reactions  . Ativan [Lorazepam] Anxiety and Other (See Comments)    Pt gets more agitated  . Adhesive [Tape] Other (See Comments)    blisters    Review of Systems  Constitutional: Negative.   Respiratory: Negative.    Endocrine: Negative.   Musculoskeletal:  Positive for gait problem.       Decreased mobility, ambulate with cane. In wheelchair today  Skin: Negative.   Allergic/Immunologic: Negative.   Neurological:  Positive for weakness.       Left hand weakness and sharp pain   Hematological: Negative.   Psychiatric/Behavioral: Negative.    All other systems reviewed and are negative.     Objective:    Physical Exam Vitals and nursing note reviewed.  Constitutional:      Appearance: Normal appearance.  Cardiovascular:     Rate and Rhythm: Normal rate and regular rhythm.     Pulses: Normal pulses.     Heart sounds: Normal heart sounds.  Pulmonary:     Effort: Pulmonary effort is normal.     Breath sounds: Normal breath sounds.  Musculoskeletal:        General: No swelling.     Comments: Hand grip strength on the left side decreased as compared with the right. Unable to completely close his left hand.   Skin:    General: Skin is warm and dry.  Neurological:     General: No focal deficit present.     Mental Status: He is alert and oriented to person, place, and time.     Motor: Weakness present.  Psychiatric:        Mood and Affect: Mood normal.        Behavior: Behavior normal.   BP (!) 150/60   Pulse 71   Temp 97.8 F  (36.6 C) (Temporal)   Ht '6\' 4"'  (1.93 m)   Wt 262 lb 9.1 oz (119.1 kg)   SpO2 98%   BMI 31.96 kg/m  Wt Readings from Last 3 Encounters:  11/05/20 262 lb 9.1 oz (119.1 kg)  10/13/20 262 lb 9.6 oz (119.1 kg)  09/08/20 268 lb (121.6 kg)    Health Maintenance Due  Topic Date Due  . TETANUS/TDAP  Never done  . Zoster Vaccines- Shingrix (1 of 2) Never done  . PNA vac Low Risk Adult (1 of 2 - PCV13) Never done    There are no preventive care reminders to display for this patient.   Lab Results  Component Value Date   TSH 2.410 03/06/2019   Lab Results  Component Value Date   WBC 10.4 04/25/2020   HGB 8.6 (L) 11/05/2020   HCT 26.0 (L) 08/06/2020   MCV 96.6 04/25/2020   PLT 220 04/25/2020   Lab Results  Component Value Date   NA 138 11/05/2020  K 4.0 11/05/2020   CO2 17 (L) 11/05/2020   GLUCOSE 187 (H) 11/05/2020   BUN 102 (H) 11/05/2020   CREATININE 4.00 (H) 11/05/2020   BILITOT 1.1 04/22/2020   ALKPHOS 80 04/22/2020   AST 16 04/22/2020   ALT 8 04/22/2020   PROT 6.0 (L) 04/22/2020   ALBUMIN 3.2 (L) 11/05/2020   CALCIUM 9.0 11/05/2020   ANIONGAP 14 11/05/2020   GFR 21.25 (L) 03/30/2020   Lab Results  Component Value Date   CHOL 118 12/25/2019   Lab Results  Component Value Date   HDL 23.60 (L) 12/25/2019   Lab Results  Component Value Date   LDLCALC 73 01/06/2019   Lab Results  Component Value Date   TRIG 217.0 (H) 12/25/2019   Lab Results  Component Value Date   CHOLHDL 5 12/25/2019   Lab Results  Component Value Date   HGBA1C 6.4 09/02/2020       Assessment & Plan:   Problem List Items Addressed This Visit     CKD (chronic kidney disease) stage 4, GFR 15-29 ml/min (HCC)   Other Visit Diagnoses     Left hand weakness    -  Primary   Relevant Orders   Ambulatory referral to [place] pain, left           Advised patient to participate in occupational therapy to help exercise and strengthen his left hand. Patient reluctant  but agrees to do so. Wife present and supportive. Call the office with any questions or concerns. Recheck as scheduled and sooner as needed.    Kennyth Arnold, FNP

## 2020-11-09 MED FILL — Darbepoetin Alfa Soln Prefilled Syringe 40 MCG/0.4ML: INTRAMUSCULAR | Qty: 0.4 | Status: AC

## 2020-11-10 ENCOUNTER — Other Ambulatory Visit: Payer: Self-pay

## 2020-11-10 ENCOUNTER — Telehealth: Payer: Self-pay

## 2020-11-10 ENCOUNTER — Other Ambulatory Visit (INDEPENDENT_AMBULATORY_CARE_PROVIDER_SITE_OTHER): Payer: Medicare Other

## 2020-11-10 DIAGNOSIS — E1159 Type 2 diabetes mellitus with other circulatory complications: Secondary | ICD-10-CM

## 2020-11-10 DIAGNOSIS — I152 Hypertension secondary to endocrine disorders: Secondary | ICD-10-CM

## 2020-11-10 DIAGNOSIS — E1169 Type 2 diabetes mellitus with other specified complication: Secondary | ICD-10-CM

## 2020-11-10 DIAGNOSIS — Z794 Long term (current) use of insulin: Secondary | ICD-10-CM

## 2020-11-10 DIAGNOSIS — E782 Mixed hyperlipidemia: Secondary | ICD-10-CM | POA: Diagnosis not present

## 2020-11-10 DIAGNOSIS — E1165 Type 2 diabetes mellitus with hyperglycemia: Secondary | ICD-10-CM | POA: Diagnosis not present

## 2020-11-10 DIAGNOSIS — E785 Hyperlipidemia, unspecified: Secondary | ICD-10-CM

## 2020-11-10 LAB — GLUCOSE, RANDOM: Glucose, Bld: 177 mg/dL — ABNORMAL HIGH (ref 70–99)

## 2020-11-10 LAB — HEMOGLOBIN A1C: Hgb A1c MFr Bld: 6.7 % — ABNORMAL HIGH (ref 4.6–6.5)

## 2020-11-10 LAB — ALT: ALT: 16 U/L (ref 0–53)

## 2020-11-10 LAB — LIPID PANEL
Cholesterol: 86 mg/dL (ref 0–200)
HDL: 21 mg/dL — ABNORMAL LOW (ref >39.00)
LDL Cholesterol: 38 mg/dL (ref 0–99)
NonHDL: 65.21
Total CHOL/HDL Ratio: 4
Triglycerides: 137 mg/dL (ref 0.0–149.0)
VLDL: 27.4 mg/dL (ref 0.0–40.0)

## 2020-11-10 NOTE — Telephone Encounter (Signed)
Referral has been placed. 

## 2020-11-10 NOTE — Telephone Encounter (Signed)
  Care Management   Follow Up Note   11/10/2020 Name: GARRETT MITCHUM MRN: [id number] DOB: [birth year]   Referred by: [name], MD Reason for referral : Chronic Care Management   SW placed a successful outbound call to the patient to assess for care management needs following recent discharge from [facility]. SW spoke with the patients wife, Edis Huish, who is listed on patients DPR. HIPAA identifiers confirmed. SW introduced chronic care management program to Mrs. Marcin who indicates she is interested in learning more. Advised Mrs. Georgana Curio SW would contact Dr. [name] to request an order if he feels the patient would benefit from this program.  During today's call Mrs. Harmes reports concerns with BM incontinence which has been occurring 102 times weekly for the past few months. Mrs. Wieman indicates imodium does help control episodes but questions if this is due to decreased kidney function. Advised Mrs. Georgana Curio SW would inform Dr. [name] of her concern so he could follow up as needed.  Follow Up Plan:  Collaboration with Dr. [name] informing of reported BM incontinence. Also discussed interest in Chronic Care Management program. SW requested Dr. [name] place a new referral if appropriate.   Daneen Schick, BSW, CDP Social Worker, Certified Dementia Practitioner [facility] [phone]

## 2020-11-10 NOTE — Telephone Encounter (Signed)
-----   Message from [name], MD sent at 11/10/2020  1:09 PM EDT ----- Please see below. We can replace referral if they want.  [name], MD 11/10/2020 1:09 PM   ----- Message ----- From: Daneen Schick Sent: 11/10/2020  12:10 PM EDT To: [name], MD  Hi Dr. [name], I am a SW with [facility] and assisting with contacting patients previously enrolled with [facility].  I spoke with Mrs. Reifschneider this morning to discuss chronic care management. She is interested in this program for Mr. Arentz. If you agree, can you please send a referral so we can get him scheduled with Shepp[facility] Evens?  Also as an FYI during the call today Mrs. Torbeck mentioned Regis is experiencing uncontrollable bowel issues about 1-2 x weekly. She said this has been going on for a few months where he is having BM incontinence throughout the home. She did state imodium is effective but wanted to know if this is due to his decreased kidney function. I let her know I would pass that question on to you.   Thank you so much,  Daneen Schick, BSW, CDP Social Worker, Certified Dementia Practitioner [facility] [phone]

## 2020-11-12 ENCOUNTER — Other Ambulatory Visit: Payer: Self-pay

## 2020-11-12 ENCOUNTER — Ambulatory Visit (INDEPENDENT_AMBULATORY_CARE_PROVIDER_SITE_OTHER): Payer: Medicare Other | Admitting: Endocrinology

## 2020-11-12 ENCOUNTER — Encounter: Payer: Self-pay | Admitting: Endocrinology

## 2020-11-12 VITALS — BP 128/64 | HR 78 | Ht 76.0 in | Wt 255.6 lb

## 2020-11-12 DIAGNOSIS — E782 Mixed hyperlipidemia: Secondary | ICD-10-CM | POA: Diagnosis not present

## 2020-11-12 DIAGNOSIS — Z794 Long term (current) use of insulin: Secondary | ICD-10-CM | POA: Diagnosis not present

## 2020-11-12 DIAGNOSIS — E1165 Type 2 diabetes mellitus with hyperglycemia: Secondary | ICD-10-CM

## 2020-11-12 DIAGNOSIS — R634 Abnormal weight loss: Secondary | ICD-10-CM | POA: Diagnosis not present

## 2020-11-12 LAB — TSH: TSH: 2.73 u[IU]/mL (ref 0.35–5.50)

## 2020-11-12 NOTE — Patient Instructions (Addendum)
[place] 1.8  [address] 36

## 2020-11-12 NOTE — Progress Notes (Signed)
Patient ID: David [name], male   DOB: 1941/02/21, 80 y.o.   MRN: [id number]    Reason for Appointment: Followup for Type 2 Diabetes   History of Present Illness:          Diagnosis: Type 2 diabetes mellitus, date of diagnosis:   1992       Past history:  He was initially treated with metformin and at some point also glipizide. Apparently he was started on insulin in 1994 approximately because of poor control He has been on various insulin regimens over the last several years However even with insulin he has had poor control for at least the last 7 or 8 years. He does not know what his previous A1c levels have been. He had been continued on metformin and glipizide but metformin stopped because of kidney function abnormality He had been taking Lantus 60 units twice a day with NovoLog previously and also Before his initial consultation in 6/15 he was on NovoLog twice a day and Humalog mix insulin  Because of poor control and large insulin doses he was started on Victoza in 6/15  Since 04/28/14 he had been on a Medtronic insulin pump because of persistent poor control and high insulin requirement  Recent history:   Non-insulin hypoglycemic drugs the patient is taking are: VICTOZA 1.2 mg daily  INSULIN regimen: Tresiba 40 units daily, NovoLog 8 units at brunch and  10 units at dinner   A1c is slightly higher at 6.7  Fructosamine : 329 last  Current management, problems and blood sugar patterns: His wife is helping him adjusting his insulin doses She is trying to keep his [place] dose more steady as recommended instead of adjusting it based on his evening glucose  However his fasting readings show moderate variability and over 200 at least 3 times this week His wife thinks that he is snacking frequently after dinner at night although yesterday he did not and his fasting reading was 129 this morning Blood sugars are mostly been checked right before dinnertime and  occasionally right after eating He was told to cut back on his Victoza last time because of decreased appetite and weight loss However he appears to be still losing weight despite eating 2 meals a day and snacks as above His blood sugars have been low twice in the last 2 weeks before his first meal  Meals usually 12 PM, 7 pm but not consistent  Blood sugars from review of his Contour meter:  PRE-MEAL Fasting Lunch Dinner Bedtime Overall for 2 weeks  Glucose range: 59-263 199, 207 78-151    Mean/median:     201   POST-MEAL PC Breakfast PC Lunch PC Dinner  Glucose range:   ?  274  Mean/median:      Previously:  PRE-MEAL Fasting Lunch Dinner  overnight Overall  Glucose range:  69-227  53, 92  65-226  54, 120   Mean/median:  163  126  110  120   POST-MEAL PC Breakfast PC Lunch PC Dinner  Glucose range:    105  Mean/median:        Self-care:   Meals: 2- 3 meals per day.   breakfast is cheerios or egg and toast.  Will have half sandwich with soup at 1 pm , dinner at  6-7 pm.     Bedtime snack is usually crackers  with milk or have been admitted sandwich         Last  consultation :  Most recent: 08/2017      Wt Readings from Last 3 Encounters:  11/12/20 255 lb 9.6 oz (115.9 kg)  11/05/20 262 lb 9.1 oz (119.1 kg)  10/13/20 262 lb 9.6 oz (119.1 kg)   Glycemic control:   Lab Results  Component Value Date   HGBA1C 6.7 (H) 11/10/2020   HGBA1C 6.4 09/02/2020   HGBA1C 6.5 03/30/2020   Lab Results  Component Value Date   MICROALBUR 7.3 (H) 03/05/2019   LDLCALC 38 11/10/2020   CREATININE 4.00 (H) 11/05/2020            Lab Results  Component Value Date   FRUCTOSAMINE 329 (H) 05/02/2019   FRUCTOSAMINE 295 (H) 12/28/2018   FRUCTOSAMINE 316 (H) 07/17/2018   FRUCTOSAMINE 319 (H) 12/27/2017     Other active problems: See review of systems   Allergies as of 11/12/2020       Reactions   Ativan [lorazepam] Anxiety, Other (See Comments)   Pt gets more agitated   Adhesive  [tape] Other (See Comments)   blisters        Medication List        Accurate as of November 12, 2020  2:42 PM. If you have any questions, ask your nurse or doctor.          acetaminophen 650 MG CR tablet Commonly known as: TYLENOL Take 650 mg by mouth every 8 (eight) hours as needed for pain.   allopurinol 100 MG tablet Commonly known as: ZYLOPRIM Take 100 mg by mouth daily.   amLODipine 5 MG tablet Commonly known as: NORVASC TAKE 1 TABLET BY MOUTH EVERY DAY   atorvastatin 10 MG tablet Commonly known as: LIPITOR TAKE 1 TABLET BY MOUTH EVERY DAY AT 6PM   B-complex with vitamin C tablet Take 1 tablet by mouth daily.   blood glucose meter kit and supplies Kit Dispense based on patient and insurance preference. Use up to four times daily as directed. (FOR ICD-9 250.00, 250.01).   calcitRIOL 0.25 MCG capsule Commonly known as: ROCALTROL Take 1 capsule by mouth once daily   carvedilol 25 MG tablet Commonly known as: COREG TAKE 1 TABLET BY MOUTH TWICE DAILY WITH A MEAL   cetirizine 10 MG tablet Commonly known as: ZYRTEC Take 10 mg by mouth daily.   clopidogrel 75 MG tablet Commonly known as: PLAVIX TAKE 1 TABLET BY MOUTH EVERY DAY   EPOGEN IJ Inject as directed See admin instructions. Every 4 weeks   Fish Oil 1000 MG Caps Take 1 capsule (1,000 mg total) by mouth every morning.   folic acid 1 MG tablet Commonly known as: FOLVITE Take 1 mg by mouth daily.   glucose blood test strip Use Contour test strips as instructed to check blood sugar four times daily.   hydrALAZINE 100 MG tablet Commonly known as: APRESOLINE TAKE 1 TABLET BY MOUTH TWICE A DAY   INSULIN SYRINGE 1CC/31GX5/16" 31G X 5/16" 1 ML Misc 1 each by Does not apply route 3 (three) times daily. Use insulin syringe to inject insulin three times daily.   isosorbide mononitrate 30 MG 24 hr tablet Commonly known as: IMDUR TAKE 2 TABLETS DAILY. PLEASE MAKE OVERDUE APPT WITH DR. Caryl Comes BEFORE ANYMORE  REFILLS. 1ST ATTEMPT   NovoLOG FlexPen 100 UNIT/ML FlexPen Generic drug: insulin aspart Inject 10-12 Units into the skin See admin instructions. Inject 10 units subcutaneously before breakfast and 12 units before supper   pantoprazole 40 MG tablet Commonly known as: PROTONIX TAKE 1 TABLET BY  MOUTH EVERY DAY   PRESCRIPTION MEDICATION Place into the left eye See admin instructions. Injections at [facility] once every 4 weeks - left eye for diabetic retinopathy   sertraline 100 MG tablet Commonly known as: ZOLOFT TAKE 1 TABLET BY MOUTH EVERY DAY   sodium bicarbonate 650 MG tablet Take 2 tablets (1,300 mg total) by mouth 2 (two) times daily.   torsemide 20 MG tablet Commonly known as: DEMADEX Take 40 mg by mouth in the AM and 69m by mouth in the PM   traMADol 50 MG tablet Commonly known as: Ultram Take 1 tablet (50 mg total) by mouth every 6 (six) hours as needed.   TTyler AasFlexTouch 200 UNIT/ML FlexTouch Pen Generic drug: insulin degludec INJECT 44 UNITS SUBCUTANEOUSLY ONCE DAILY   Victoza 18 MG/3ML Sopn Generic drug: liraglutide INJECT 1.8 MG INTO THE SKIN DAILY BEFORE SUPPER.   vitamin B-12 100 MCG tablet Commonly known as: CYANOCOBALAMIN Take 100 mcg by mouth daily.        Allergies:  Allergies  Allergen Reactions   Ativan [Lorazepam] Anxiety and Other (See Comments)    Pt gets more agitated   Adhesive [Tape] Other (See Comments)    blisters    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[place]    resolved per patient   CKD (chronic kidney disease) stage 4, GFR 15-29 ml/min  ([facility] 09/26/2017   CKD (chronic kidney disease), stage III (HCC) secondary to DM and HTN   nephrologist-  [name]   Coronary artery disease involving native coronary artery of native heart without angina pectoris    Depression    Diastolic dysfunction    Dyspnea    with exertion, no oxygen   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 ([place]'s 09/04/2017   Hearing loss    wears hearing aids   History of cellulitis    right great toe 10-25-2014   History of skin cancer    HOCM (hypertrophic obstructive cardiomyopathy) (HCC)    Hyperlipidemia associated with type 2 diabetes mellitus ([place] 10/25/2013   Hypertension    Hypertension associated with diabetes (HL[place] 10/25/2013   IDDM (insulin dependent diabetes mellitus) - on insulin pump 03/04/2017   Insulin dependent type 2 diabetes mellitus ([place] 1991   followd by dr kDwyane Dee-  has insulin pump   Insulin pump in place    Patient does not have insulin pump - removed approx 2 yrs ago 2019   Memory loss    mild per patient   OSA on CPAP    Pacemaker 06/03/2015   Medtronic   Peripheral neuropathy    severe - feet/lower legs   Peripheral vascular disease ([place]    bilateral lower extremities   PVD (peripheral vascular disease) (HL[facility] [place]    Rib fracture 07/24/2015   Seasonal and perennial allergic rhinitis 10/25/2013   Seborrheic keratosis 09/19/2016   Secondary hyperparathyroidism of renal origin (HH[place]    Sinus node arrhythmia 06/03/2015   Sinus node dysfunction (H[place]    Sleep apnea    Syncope 07/24/2015   WGilford Rile  as ambulation aid     Past Surgical History:  Procedure Laterality Date   AMPUTATION OF REPLICATED TOES  Mar 2595   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)   AMPUTATION TOE Right 03/12/2015   Procedure: RIGHT HALLUS AMPUTATION ;  Surgeon: [name], MD;  Location: [place];  Service: Podiatry;  Laterality: Right;   AV FISTULA PLACEMENT Left 04/16/2020   Procedure: CREATION OF LEFT Brachial/Cephalic  Arteriovenous Fistula.;  Surgeon: Cherre Robins, MD;  Location: [place];  Service: Vascular;  Laterality: Left;   [place] Left 08/06/2020   Procedure: BRANCH LIGATION OF LEFT UPPER EXTREMITY ARTERIOVENOUS FISTULA;  Surgeon: Cherre Robins, MD;  Location: [place];  Service: Vascular;  Laterality: Left;   CARDIAC CATHETERIZATION  11-25-2010   [place], [place]   Nonobstructive CAD   CARDIAC PACEMAKER PLACEMENT  Nov 2009   Medtronic   CHOLECYSTECTOMY N/A 03/05/2017   Procedure: LAPAROSCOPIC CHOLECYSTECTOMY WITH INTRAOPERATIVE CHOLANGIOGRAM;  Surgeon: [name], MD;  [facility];  Service: General;  Laterality: N/A;   EP IMPLANTABLE DEVICE N/A 06/03/2015   Procedure: PPM Generator Changeout;  Surgeon: [name], MD;  Location: [place] [facility];  Service: Cardiovascular;  Laterality: N/A;   EXCISION BONE CYST Right 03/06/2015   Procedure: BONE BIOPSIES OF RIGHT FOOT;  Surgeon: [name], MD;  Location: [place];  Service: Podiatry;  Laterality: Right;   EYE SURGERY Bilateral    KNEE ARTHROSCOPY     ORIF ANKLE FRACTURE Left 11/06/2014   Procedure: OPEN REDUCTION INTERNAL FIXATION (ORIF) LEFT  ANKLE FRACTURE;  Surgeon: [name], MD;  Location: [place];  Service: Orthopedics;  Laterality: Left;   TEE WITHOUT CARDIOVERSION N/A 09/01/2017   Procedure: TRANSESOPHAGEAL ECHOCARDIOGRAM (TEE);  Surgeon: [name], MD;  [facility];  Service: Cardiovascular;  Laterality: N/A;   TOTAL KNEE ARTHROPLASTY     patient denies this surgery   VEIN LIGATION AND STRIPPING      Family History  Problem Relation Age of Onset   Cancer Mother        breast   Heart attack Father    Stroke Father     Social History:  reports that he quit smoking about 36 years ago. His smoking use included cigarettes. He has a 60.00 pack-year smoking history. He has never used smokeless tobacco. He reports current alcohol use of about 1.0  standard drink of alcohol per week. He reports that he does not use drugs.    Review of Systems         Lipids: Had been on and off Lipitor since late 2014 and is now taking 10 mg.   LDL is below 70 Previously had high ALT with Lipitor but now doing well with 10 mg prescribed by his PCP  HDL is consistently low  He is taking OTC  fish oil  Also taking cholestyramine for other reasons Now triglycerides are back to normal possibly from weight loss   Lab Results  Component Value Date   CHOL 86 11/10/2020   CHOL 118 12/25/2019   CHOL 108 03/01/2019   Lab Results  Component Value Date   HDL 21.00 (L) 11/10/2020   HDL 23.60 (L) 12/25/2019   HDL 22.10 (L) 03/01/2019   Lab Results  Component Value Date   LDLCALC 38 11/10/2020   LDLCALC 73 01/06/2019   LDLCALC 62 07/17/2018   Lab Results  Component Value Date   TRIG 137.0 11/10/2020  TRIG 217.0 (H) 12/25/2019   TRIG 255.0 (H) 03/01/2019   Lab Results  Component Value Date   CHOLHDL 4 11/10/2020   CHOLHDL 5 12/25/2019   CHOLHDL 5 03/01/2019   Lab Results  Component Value Date   LDLDIRECT 45.0 12/25/2019   LDLDIRECT 37.0 03/01/2019   LDLDIRECT 50.0 12/27/2017    Lab Results  Component Value Date   ALT 16 11/10/2020       HYPERTENSION: The blood pressure has been treated with various drugs including carvedilol 25 mg by other physicians. Bp at home 140/60  BP Readings from Last 3 Encounters:  11/12/20 128/64  11/05/20 (!) 116/47  11/05/20 (!) 150/60     Chronic kidney disease: Has variable results, is followed by nephrologist and likely will be going for dialysis soon   Creatinine levels recently:  Lab Results  Component Value Date   CREATININE 4.00 (H) 11/05/2020   CREATININE 4.65 (H) 10/08/2020   CREATININE 3.64 (H) 09/10/2020         Has history of significant loss of sensation in his feet  since about 2013  He has been prescribed diabetic shoes He is  under the care of a podiatrist,  recently regular  Vitamin B12 deficiency: managed with monthly injections  Lab Results  Component Value Date   VITAMINB12 500 03/06/2019   Has anemia of kidney disease, fairly severe and is complaining of cold intolerance    Physical Examination:  BP 128/64   Pulse 78   Ht 6' 4" (1.93 m)   Wt 255 lb 9.6 oz (115.9 kg)   SpO2 98%   BMI 31.11 kg/m   Thyroid not palpable Mild puffiness of the face present Biceps reflexes appear normal  ASSESSMENT:  Diabetes type 2, with obesity  See history of present illness for detailed discussion of current diabetes management, blood sugar patterns and problems identified  He is on basal bolus insulin injections and Victoza   A1c is 6.7 although this may be affected by his anemia  His blood sugars are fluctuating based on his appetite and compliance with diet including snacks at night He has lost weight for other reasons Not clear if his mealtime insulin is adjusted well because of lack of postprandial monitoring  LIPIDS well controlled and likely triglycerides are better with weight loss  Progressive weight loss: Although he does not look hyperthyroid he has significant fatigue and cold intolerance and no recent thyroid levels are available  PLAN:  He will go back up to 1.8 mg Victoza Reduce Tresiba to 36 units to potentially reduce chances of low sugars overnight No change in NovoLog as yet More blood sugar 2 to 3 hours after meals He will try to avoid excessive snacking, high carbohydrate meals and high sugar fruits like watermelon He will call if he has unusually high or low blood sugar Also anticipate that he may need a little higher dose when he starts dialysis Also check fructosamine on next visit to correlate with his A1c since he has anemia and renal failure  Add TSH to the labs  Patient Instructions  Victoza 1.8  Tresiba 36        Elayne Snare 11/12/2020, 2:42 PM   Note: This office note was prepared with Dragon  voice recognition system technology. Any transcriptional errors that result from this process are unintentional.

## 2020-11-13 DIAGNOSIS — H35033 Hypertensive retinopathy, bilateral: Secondary | ICD-10-CM | POA: Diagnosis not present

## 2020-11-13 DIAGNOSIS — H35361 Drusen (degenerative) of macula, right eye: Secondary | ICD-10-CM | POA: Diagnosis not present

## 2020-11-13 DIAGNOSIS — E113413 Type 2 diabetes mellitus with severe nonproliferative diabetic retinopathy with macular edema, bilateral: Secondary | ICD-10-CM | POA: Diagnosis not present

## 2020-11-13 DIAGNOSIS — H35373 Puckering of macula, bilateral: Secondary | ICD-10-CM | POA: Diagnosis not present

## 2020-11-20 ENCOUNTER — Telehealth: Payer: Self-pay | Admitting: Family Medicine

## 2020-11-20 NOTE — Chronic Care Management (AMB) (Signed)
  Chronic Care Management   Note  11/20/2020 Name: David [name] MRN: [id number] DOB: 1941-01-18  David [name] is a 80 y.o. year old male who is a primary care patient of [name], MD. I reached out to David [name] by phone today in response to a referral sent by [name] PCP, [name], MD.   [name] was given information about Chronic Care Management services today including:  CCM service includes personalized support from designated clinical staff supervised by his physician, including individualized plan of care and coordination with other care providers 24/7 contact phone numbers for assistance for urgent and routine care needs. Service will only be billed when office clinical staff spend 20 minutes or more in a month to coordinate care. Only one practitioner may furnish and bill the service in a calendar month. The patient may stop CCM services at any time (effective at the end of the month) by phone call to the office staff.   Patient agreed to services and verbal consent obtained.   Follow up plan:   Lauretta Grill [employer] Scheduler

## 2020-11-23 ENCOUNTER — Telehealth: Payer: Self-pay

## 2020-11-23 DIAGNOSIS — N186 End stage renal disease: Secondary | ICD-10-CM | POA: Diagnosis not present

## 2020-11-23 DIAGNOSIS — I2510 Atherosclerotic heart disease of native coronary artery without angina pectoris: Secondary | ICD-10-CM | POA: Diagnosis not present

## 2020-11-23 DIAGNOSIS — Z992 Dependence on renal dialysis: Secondary | ICD-10-CM | POA: Diagnosis not present

## 2020-11-23 DIAGNOSIS — R531 Weakness: Secondary | ICD-10-CM | POA: Diagnosis not present

## 2020-11-23 DIAGNOSIS — Z794 Long term (current) use of insulin: Secondary | ICD-10-CM | POA: Diagnosis not present

## 2020-11-23 DIAGNOSIS — E1122 Type 2 diabetes mellitus with diabetic chronic kidney disease: Secondary | ICD-10-CM | POA: Diagnosis not present

## 2020-11-23 DIAGNOSIS — I69318 Other symptoms and signs involving cognitive functions following cerebral infarction: Secondary | ICD-10-CM | POA: Diagnosis not present

## 2020-11-23 DIAGNOSIS — I120 Hypertensive chronic kidney disease with stage 5 chronic kidney disease or end stage renal disease: Secondary | ICD-10-CM | POA: Diagnosis not present

## 2020-11-23 DIAGNOSIS — M79642 Pain in left hand: Secondary | ICD-10-CM | POA: Diagnosis not present

## 2020-11-23 DIAGNOSIS — T82848D Pain from vascular prosthetic devices, implants and grafts, subsequent encounter: Secondary | ICD-10-CM | POA: Diagnosis not present

## 2020-11-23 DIAGNOSIS — I480 Paroxysmal atrial fibrillation: Secondary | ICD-10-CM | POA: Diagnosis not present

## 2020-11-23 NOTE — Telephone Encounter (Signed)
Home Health Verbal Orders  Agency:   [facility]   Requesting PT    Frequency:  2 week 3 // 1 week 1  HH needs F2F w/in last 30 days

## 2020-11-24 ENCOUNTER — Telehealth: Payer: Self-pay | Admitting: *Deleted

## 2020-11-24 NOTE — Telephone Encounter (Signed)
Ok to give VO? Please advise

## 2020-11-24 NOTE — Telephone Encounter (Signed)
Ok with me. Please place any necessary orders. 

## 2020-11-24 NOTE — Telephone Encounter (Signed)
[place]arris [phone] call will like to know what is the expected changes on his daily weigh loss or gain? Please advise

## 2020-11-24 NOTE — Telephone Encounter (Signed)
Called and left VO on Charri confidential vm.

## 2020-11-24 NOTE — Telephone Encounter (Signed)
I am not sure I understand the question. Can we get clarification? His diuretics are managed by his nephrologist. If he has rapid weight gain then he should let us or them know.  David [name]. [name], MD 11/24/2020 12:52 PM

## 2020-11-25 NOTE — Telephone Encounter (Signed)
LVM for the weight information please, diuretics are managed by his nephrologist

## 2020-11-25 NOTE — Addendum Note (Signed)
Encounter addended by: Landis Martins, RN on: 11/25/2020 10:40 AM  Actions taken: MAR administration accepted

## 2020-11-26 DIAGNOSIS — R531 Weakness: Secondary | ICD-10-CM | POA: Diagnosis not present

## 2020-11-26 DIAGNOSIS — T82848D Pain from vascular prosthetic devices, implants and grafts, subsequent encounter: Secondary | ICD-10-CM | POA: Diagnosis not present

## 2020-11-26 DIAGNOSIS — I120 Hypertensive chronic kidney disease with stage 5 chronic kidney disease or end stage renal disease: Secondary | ICD-10-CM | POA: Diagnosis not present

## 2020-11-26 DIAGNOSIS — I69318 Other symptoms and signs involving cognitive functions following cerebral infarction: Secondary | ICD-10-CM | POA: Diagnosis not present

## 2020-11-26 DIAGNOSIS — E1122 Type 2 diabetes mellitus with diabetic chronic kidney disease: Secondary | ICD-10-CM | POA: Diagnosis not present

## 2020-11-26 DIAGNOSIS — M79642 Pain in left hand: Secondary | ICD-10-CM | POA: Diagnosis not present

## 2020-11-27 DIAGNOSIS — D509 Iron deficiency anemia, unspecified: Secondary | ICD-10-CM | POA: Diagnosis not present

## 2020-11-27 DIAGNOSIS — I120 Hypertensive chronic kidney disease with stage 5 chronic kidney disease or end stage renal disease: Secondary | ICD-10-CM | POA: Diagnosis not present

## 2020-11-27 DIAGNOSIS — I770 Arteriovenous fistula, acquired: Secondary | ICD-10-CM | POA: Diagnosis not present

## 2020-11-27 DIAGNOSIS — N185 Chronic kidney disease, stage 5: Secondary | ICD-10-CM | POA: Diagnosis not present

## 2020-11-27 DIAGNOSIS — E1122 Type 2 diabetes mellitus with diabetic chronic kidney disease: Secondary | ICD-10-CM | POA: Diagnosis not present

## 2020-11-27 DIAGNOSIS — N2581 Secondary hyperparathyroidism of renal origin: Secondary | ICD-10-CM | POA: Diagnosis not present

## 2020-11-27 DIAGNOSIS — Z8674 Personal history of sudden cardiac arrest: Secondary | ICD-10-CM | POA: Diagnosis not present

## 2020-11-29 ENCOUNTER — Other Ambulatory Visit: Payer: Self-pay | Admitting: Family Medicine

## 2020-12-01 DIAGNOSIS — I69318 Other symptoms and signs involving cognitive functions following cerebral infarction: Secondary | ICD-10-CM | POA: Diagnosis not present

## 2020-12-01 DIAGNOSIS — R531 Weakness: Secondary | ICD-10-CM | POA: Diagnosis not present

## 2020-12-01 DIAGNOSIS — E1122 Type 2 diabetes mellitus with diabetic chronic kidney disease: Secondary | ICD-10-CM | POA: Diagnosis not present

## 2020-12-01 DIAGNOSIS — T82848D Pain from vascular prosthetic devices, implants and grafts, subsequent encounter: Secondary | ICD-10-CM | POA: Diagnosis not present

## 2020-12-01 DIAGNOSIS — I120 Hypertensive chronic kidney disease with stage 5 chronic kidney disease or end stage renal disease: Secondary | ICD-10-CM | POA: Diagnosis not present

## 2020-12-01 DIAGNOSIS — M79642 Pain in left hand: Secondary | ICD-10-CM | POA: Diagnosis not present

## 2020-12-02 DIAGNOSIS — I120 Hypertensive chronic kidney disease with stage 5 chronic kidney disease or end stage renal disease: Secondary | ICD-10-CM | POA: Diagnosis not present

## 2020-12-02 DIAGNOSIS — M79642 Pain in left hand: Secondary | ICD-10-CM | POA: Diagnosis not present

## 2020-12-02 DIAGNOSIS — E1122 Type 2 diabetes mellitus with diabetic chronic kidney disease: Secondary | ICD-10-CM | POA: Diagnosis not present

## 2020-12-02 DIAGNOSIS — I69318 Other symptoms and signs involving cognitive functions following cerebral infarction: Secondary | ICD-10-CM | POA: Diagnosis not present

## 2020-12-02 DIAGNOSIS — R531 Weakness: Secondary | ICD-10-CM | POA: Diagnosis not present

## 2020-12-02 DIAGNOSIS — T82848D Pain from vascular prosthetic devices, implants and grafts, subsequent encounter: Secondary | ICD-10-CM | POA: Diagnosis not present

## 2020-12-03 ENCOUNTER — Other Ambulatory Visit: Payer: Self-pay

## 2020-12-03 ENCOUNTER — Ambulatory Visit (HOSPITAL_COMMUNITY)
Admission: RE | Admit: 2020-12-03 | Discharge: 2020-12-03 | Disposition: A | Discharge status: Home / Self Care | Payer: Medicare Other | Source: Ambulatory Visit | Attending: Nephrology | Admitting: Nephrology

## 2020-12-03 VITALS — BP 115/51 | HR 105 | Temp 97.2°F | Resp 20

## 2020-12-03 DIAGNOSIS — M79642 Pain in left hand: Secondary | ICD-10-CM | POA: Diagnosis not present

## 2020-12-03 DIAGNOSIS — N184 Chronic kidney disease, stage 4 (severe): Secondary | ICD-10-CM

## 2020-12-03 DIAGNOSIS — D631 Anemia in chronic kidney disease: Secondary | ICD-10-CM | POA: Diagnosis not present

## 2020-12-03 DIAGNOSIS — R531 Weakness: Secondary | ICD-10-CM | POA: Diagnosis not present

## 2020-12-03 DIAGNOSIS — E1122 Type 2 diabetes mellitus with diabetic chronic kidney disease: Secondary | ICD-10-CM | POA: Diagnosis not present

## 2020-12-03 DIAGNOSIS — T82848D Pain from vascular prosthetic devices, implants and grafts, subsequent encounter: Secondary | ICD-10-CM | POA: Diagnosis not present

## 2020-12-03 DIAGNOSIS — I69318 Other symptoms and signs involving cognitive functions following cerebral infarction: Secondary | ICD-10-CM | POA: Diagnosis not present

## 2020-12-03 DIAGNOSIS — I120 Hypertensive chronic kidney disease with stage 5 chronic kidney disease or end stage renal disease: Secondary | ICD-10-CM | POA: Diagnosis not present

## 2020-12-03 LAB — CBC WITH DIFFERENTIAL/PLATELET
Abs Immature Granulocytes: 0.03 10*3/uL (ref 0.00–0.07)
Basophils Absolute: 0 10*3/uL (ref 0.0–0.1)
Basophils Relative: 0 %
Eosinophils Absolute: 0.3 10*3/uL (ref 0.0–0.5)
Eosinophils Relative: 4 %
HCT: 28 % — ABNORMAL LOW (ref 39.0–52.0)
Hemoglobin: 9.1 g/dL — ABNORMAL LOW (ref 13.0–17.0)
Immature Granulocytes: 0 %
Lymphocytes Relative: 17 %
Lymphs Abs: 1.2 10*3/uL (ref 0.7–4.0)
MCH: 34.3 pg — ABNORMAL HIGH (ref 26.0–34.0)
MCHC: 32.5 g/dL (ref 30.0–36.0)
MCV: 105.7 fL — ABNORMAL HIGH (ref 80.0–100.0)
Monocytes Absolute: 0.7 10*3/uL (ref 0.1–1.0)
Monocytes Relative: 9 %
Neutro Abs: 5 10*3/uL (ref 1.7–7.7)
Neutrophils Relative %: 70 %
Platelets: 173 10*3/uL (ref 150–400)
RBC: 2.65 MIL/uL — ABNORMAL LOW (ref 4.22–5.81)
RDW: 14.7 % (ref 11.5–15.5)
WBC: 7.2 10*3/uL (ref 4.0–10.5)
nRBC: 0 % (ref 0.0–0.2)

## 2020-12-03 LAB — RENAL FUNCTION PANEL
Albumin: 3.2 g/dL — ABNORMAL LOW (ref 3.5–5.0)
Anion gap: 12 (ref 5–15)
BUN: 83 mg/dL — ABNORMAL HIGH (ref 8–23)
CO2: 20 mmol/L — ABNORMAL LOW (ref 22–32)
Calcium: 9 mg/dL (ref 8.9–10.3)
Chloride: 108 mmol/L (ref 98–111)
Creatinine, Ser: 3.99 mg/dL — ABNORMAL HIGH (ref 0.61–1.24)
GFR, Estimated: 14 mL/min — ABNORMAL LOW (ref >60)
Glucose, Bld: 78 mg/dL (ref 70–99)
Phosphorus: 4.8 mg/dL — ABNORMAL HIGH (ref 2.5–4.6)
Potassium: 4.9 mmol/L (ref 3.5–5.1)
Sodium: 140 mmol/L (ref 135–145)

## 2020-12-03 LAB — POCT HEMOGLOBIN-HEMACUE: Hemoglobin: 9 g/dL — ABNORMAL LOW (ref 13.0–17.0)

## 2020-12-03 LAB — IRON AND TIBC
Iron: 72 ug/dL (ref 45–182)
Saturation Ratios: 34 % (ref 17.9–39.5)
TIBC: 213 ug/dL — ABNORMAL LOW (ref 250–450)
UIBC: 141 ug/dL

## 2020-12-03 LAB — FERRITIN: Ferritin: 407 ng/mL — ABNORMAL HIGH (ref 24–336)

## 2020-12-03 LAB — HEPATITIS B SURFACE ANTIGEN: Hepatitis B Surface Ag: NONREACTIVE

## 2020-12-03 MED ORDER — DARBEPOETIN ALFA 100 MCG/0.5ML IJ SOSY
PREFILLED_SYRINGE | INTRAMUSCULAR | Status: AC
  Filled 2020-12-03: qty 0.5

## 2020-12-03 MED ORDER — DARBEPOETIN ALFA 100 MCG/0.5ML IJ SOSY
100.0000 ug | PREFILLED_SYRINGE | INTRAMUSCULAR | Status: DC
  Administered 2020-12-03: 100 ug via SUBCUTANEOUS

## 2020-12-04 DIAGNOSIS — I69318 Other symptoms and signs involving cognitive functions following cerebral infarction: Secondary | ICD-10-CM | POA: Diagnosis not present

## 2020-12-04 DIAGNOSIS — E1122 Type 2 diabetes mellitus with diabetic chronic kidney disease: Secondary | ICD-10-CM | POA: Diagnosis not present

## 2020-12-04 DIAGNOSIS — T82848D Pain from vascular prosthetic devices, implants and grafts, subsequent encounter: Secondary | ICD-10-CM | POA: Diagnosis not present

## 2020-12-04 DIAGNOSIS — I120 Hypertensive chronic kidney disease with stage 5 chronic kidney disease or end stage renal disease: Secondary | ICD-10-CM | POA: Diagnosis not present

## 2020-12-04 DIAGNOSIS — M79642 Pain in left hand: Secondary | ICD-10-CM | POA: Diagnosis not present

## 2020-12-04 DIAGNOSIS — R531 Weakness: Secondary | ICD-10-CM | POA: Diagnosis not present

## 2020-12-08 ENCOUNTER — Telehealth: Payer: Self-pay | Admitting: Cardiology

## 2020-12-08 DIAGNOSIS — Z992 Dependence on renal dialysis: Secondary | ICD-10-CM | POA: Diagnosis not present

## 2020-12-08 DIAGNOSIS — N186 End stage renal disease: Secondary | ICD-10-CM | POA: Diagnosis not present

## 2020-12-08 DIAGNOSIS — R531 Weakness: Secondary | ICD-10-CM | POA: Diagnosis not present

## 2020-12-08 DIAGNOSIS — I480 Paroxysmal atrial fibrillation: Secondary | ICD-10-CM | POA: Diagnosis not present

## 2020-12-08 DIAGNOSIS — D631 Anemia in chronic kidney disease: Secondary | ICD-10-CM | POA: Diagnosis not present

## 2020-12-08 DIAGNOSIS — T82848D Pain from vascular prosthetic devices, implants and grafts, subsequent encounter: Secondary | ICD-10-CM | POA: Diagnosis not present

## 2020-12-08 DIAGNOSIS — I2510 Atherosclerotic heart disease of native coronary artery without angina pectoris: Secondary | ICD-10-CM | POA: Diagnosis not present

## 2020-12-08 DIAGNOSIS — M79642 Pain in left hand: Secondary | ICD-10-CM | POA: Diagnosis not present

## 2020-12-08 DIAGNOSIS — Z794 Long term (current) use of insulin: Secondary | ICD-10-CM | POA: Diagnosis not present

## 2020-12-08 DIAGNOSIS — E1122 Type 2 diabetes mellitus with diabetic chronic kidney disease: Secondary | ICD-10-CM | POA: Diagnosis not present

## 2020-12-08 DIAGNOSIS — N189 Chronic kidney disease, unspecified: Secondary | ICD-10-CM | POA: Insufficient documentation

## 2020-12-08 DIAGNOSIS — I69318 Other symptoms and signs involving cognitive functions following cerebral infarction: Secondary | ICD-10-CM | POA: Diagnosis not present

## 2020-12-08 DIAGNOSIS — I120 Hypertensive chronic kidney disease with stage 5 chronic kidney disease or end stage renal disease: Secondary | ICD-10-CM | POA: Diagnosis not present

## 2020-12-08 DIAGNOSIS — E8770 Fluid overload, unspecified: Secondary | ICD-10-CM | POA: Diagnosis not present

## 2020-12-08 NOTE — Telephone Encounter (Signed)
Patient's wife was calling with question/concern with the cpap machine. Please advise

## 2020-12-08 NOTE — Telephone Encounter (Signed)
Pts wife calling because she is having issues getting his new CPAP machine. She states she has tried calling the supplier but they have not returned any of her calls. The machine should have been delivered some time ago. She is concerned because he is starting dialysis tomorrow.  I will forward to Dr. Theodosia Blender nurse and CMA for follow up.

## 2020-12-09 ENCOUNTER — Telehealth: Payer: Self-pay

## 2020-12-09 DIAGNOSIS — M79642 Pain in left hand: Secondary | ICD-10-CM | POA: Diagnosis not present

## 2020-12-09 DIAGNOSIS — E1122 Type 2 diabetes mellitus with diabetic chronic kidney disease: Secondary | ICD-10-CM | POA: Diagnosis not present

## 2020-12-09 DIAGNOSIS — R531 Weakness: Secondary | ICD-10-CM | POA: Diagnosis not present

## 2020-12-09 DIAGNOSIS — T82848D Pain from vascular prosthetic devices, implants and grafts, subsequent encounter: Secondary | ICD-10-CM | POA: Diagnosis not present

## 2020-12-09 DIAGNOSIS — I120 Hypertensive chronic kidney disease with stage 5 chronic kidney disease or end stage renal disease: Secondary | ICD-10-CM | POA: Diagnosis not present

## 2020-12-09 DIAGNOSIS — I69318 Other symptoms and signs involving cognitive functions following cerebral infarction: Secondary | ICD-10-CM | POA: Diagnosis not present

## 2020-12-09 NOTE — Telephone Encounter (Signed)
FYI

## 2020-12-09 NOTE — Telephone Encounter (Signed)
Katie from [facility] called and wanted to let Dr. [name] know the patient had a fall on 12/08/20 around 1pm. Patient fell backwards outside on cement and hit his head. Patient has a bump on his head wife stated he was confused for about 20 mins but seemed fine after that. If any questions can reach out to Joellen Jersey the physical therapist for [facility] health at [phone].

## 2020-12-10 NOTE — Telephone Encounter (Signed)
Pt wife returned call and states pt is doing fine.

## 2020-12-10 NOTE — Telephone Encounter (Signed)
Called and lm for pt tcb. 

## 2020-12-10 NOTE — Telephone Encounter (Signed)
Please call and check on patient. Recommend office visit if needed.  [name], MD 12/10/2020 8:08 AM

## 2020-12-11 DIAGNOSIS — Z992 Dependence on renal dialysis: Secondary | ICD-10-CM | POA: Diagnosis not present

## 2020-12-11 DIAGNOSIS — E8770 Fluid overload, unspecified: Secondary | ICD-10-CM | POA: Diagnosis not present

## 2020-12-11 DIAGNOSIS — D631 Anemia in chronic kidney disease: Secondary | ICD-10-CM | POA: Diagnosis not present

## 2020-12-11 DIAGNOSIS — T82898A Other specified complication of vascular prosthetic devices, implants and grafts, initial encounter: Secondary | ICD-10-CM | POA: Diagnosis not present

## 2020-12-11 DIAGNOSIS — N186 End stage renal disease: Secondary | ICD-10-CM | POA: Diagnosis not present

## 2020-12-14 DIAGNOSIS — Z992 Dependence on renal dialysis: Secondary | ICD-10-CM | POA: Diagnosis not present

## 2020-12-14 DIAGNOSIS — D631 Anemia in chronic kidney disease: Secondary | ICD-10-CM | POA: Diagnosis not present

## 2020-12-14 DIAGNOSIS — E8770 Fluid overload, unspecified: Secondary | ICD-10-CM | POA: Diagnosis not present

## 2020-12-14 DIAGNOSIS — D509 Iron deficiency anemia, unspecified: Secondary | ICD-10-CM | POA: Diagnosis not present

## 2020-12-14 DIAGNOSIS — E1129 Type 2 diabetes mellitus with other diabetic kidney complication: Secondary | ICD-10-CM | POA: Diagnosis not present

## 2020-12-14 DIAGNOSIS — N186 End stage renal disease: Secondary | ICD-10-CM | POA: Diagnosis not present

## 2020-12-14 NOTE — Telephone Encounter (Signed)
Called patient and assured him I will call his dme [employer] to follow up on information about his cpap set up.

## 2020-12-15 ENCOUNTER — Other Ambulatory Visit: Payer: Self-pay

## 2020-12-15 ENCOUNTER — Other Ambulatory Visit: Payer: Self-pay | Admitting: *Deleted

## 2020-12-15 ENCOUNTER — Telehealth: Payer: Self-pay | Admitting: Cardiology

## 2020-12-15 DIAGNOSIS — E8770 Fluid overload, unspecified: Secondary | ICD-10-CM | POA: Diagnosis not present

## 2020-12-15 DIAGNOSIS — N186 End stage renal disease: Secondary | ICD-10-CM | POA: Diagnosis not present

## 2020-12-15 DIAGNOSIS — D631 Anemia in chronic kidney disease: Secondary | ICD-10-CM | POA: Diagnosis not present

## 2020-12-15 DIAGNOSIS — E1159 Type 2 diabetes mellitus with other circulatory complications: Secondary | ICD-10-CM

## 2020-12-15 DIAGNOSIS — I152 Hypertension secondary to endocrine disorders: Secondary | ICD-10-CM

## 2020-12-15 DIAGNOSIS — D509 Iron deficiency anemia, unspecified: Secondary | ICD-10-CM | POA: Diagnosis not present

## 2020-12-15 DIAGNOSIS — Z992 Dependence on renal dialysis: Secondary | ICD-10-CM | POA: Diagnosis not present

## 2020-12-15 MED ORDER — ISOSORBIDE MONONITRATE ER 30 MG PO TB24: ORAL_TABLET | ORAL | 3 refills | Status: DC

## 2020-12-15 NOTE — Telephone Encounter (Signed)
Called [facility] to check the status of patients cpap machine and was informed it will be at least 4-6 weeks more because they do not have the inventory. Patient notified.

## 2020-12-15 NOTE — Telephone Encounter (Signed)
*  STAT* If patient is at the pharmacy, call can be transferred to refill team.   1. Which medications need to be refilled? (please list name of each medication and dose if known) isosorbide mononitrate (IMDUR) 30 MG 24 hr tablet  2. Which pharmacy/location (including street and city if local pharmacy) is medication to be sent to? [facility]  3. Do they need a 30 day or 90 day supply? 90ds

## 2020-12-15 NOTE — Telephone Encounter (Signed)
Notified patients wife medication was sent to pharmacy.

## 2020-12-15 NOTE — Telephone Encounter (Signed)
Received a call from [facility], patient has transferred pharmacies and is wondering if they can get the prescriptions that he is currently taking transferred to them, as [facility] was unable to transfer due to no refills.   carvedilol (COREG) 25 MG tablet / amLODipine (NORVASC) 5 MG tablet/atorvastatin (LIPITOR) 10 MG tablet /sertraline (ZOLOFT) 100 MG tablet /allopurinol (ZYLOPRIM) 100 MG tablet / hydrALAZINE (APRESOLINE) 100 MG tablet

## 2020-12-16 ENCOUNTER — Other Ambulatory Visit: Payer: Self-pay | Admitting: *Deleted

## 2020-12-16 ENCOUNTER — Telehealth: Payer: Self-pay

## 2020-12-16 DIAGNOSIS — E8770 Fluid overload, unspecified: Secondary | ICD-10-CM | POA: Diagnosis not present

## 2020-12-16 DIAGNOSIS — D509 Iron deficiency anemia, unspecified: Secondary | ICD-10-CM | POA: Diagnosis not present

## 2020-12-16 DIAGNOSIS — T82848D Pain from vascular prosthetic devices, implants and grafts, subsequent encounter: Secondary | ICD-10-CM | POA: Diagnosis not present

## 2020-12-16 DIAGNOSIS — M79642 Pain in left hand: Secondary | ICD-10-CM | POA: Diagnosis not present

## 2020-12-16 DIAGNOSIS — I69318 Other symptoms and signs involving cognitive functions following cerebral infarction: Secondary | ICD-10-CM | POA: Diagnosis not present

## 2020-12-16 DIAGNOSIS — E1159 Type 2 diabetes mellitus with other circulatory complications: Secondary | ICD-10-CM

## 2020-12-16 DIAGNOSIS — I120 Hypertensive chronic kidney disease with stage 5 chronic kidney disease or end stage renal disease: Secondary | ICD-10-CM | POA: Diagnosis not present

## 2020-12-16 DIAGNOSIS — N186 End stage renal disease: Secondary | ICD-10-CM | POA: Diagnosis not present

## 2020-12-16 DIAGNOSIS — I152 Hypertension secondary to endocrine disorders: Secondary | ICD-10-CM

## 2020-12-16 DIAGNOSIS — Z992 Dependence on renal dialysis: Secondary | ICD-10-CM | POA: Diagnosis not present

## 2020-12-16 DIAGNOSIS — E1122 Type 2 diabetes mellitus with diabetic chronic kidney disease: Secondary | ICD-10-CM | POA: Diagnosis not present

## 2020-12-16 DIAGNOSIS — D631 Anemia in chronic kidney disease: Secondary | ICD-10-CM | POA: Diagnosis not present

## 2020-12-16 DIAGNOSIS — R531 Weakness: Secondary | ICD-10-CM | POA: Diagnosis not present

## 2020-12-16 MED ORDER — HYDRALAZINE HCL 100 MG PO TABS: 100.0000 mg | ORAL_TABLET | Freq: Two times a day (BID) | ORAL | 0 refills | Status: DC

## 2020-12-16 MED ORDER — ALLOPURINOL 100 MG PO TABS: 100.0000 mg | ORAL_TABLET | Freq: Every day | ORAL | 3 refills | Status: DC

## 2020-12-16 MED ORDER — CALCITRIOL 0.25 MCG PO CAPS: 0.2500 ug | ORAL_CAPSULE | Freq: Every day | ORAL | 0 refills | Status: AC

## 2020-12-16 MED ORDER — AMLODIPINE BESYLATE 5 MG PO TABS: 5.0000 mg | ORAL_TABLET | Freq: Every day | ORAL | 1 refills | Status: DC

## 2020-12-16 MED ORDER — CARVEDILOL 25 MG PO TABS: 25.0000 mg | ORAL_TABLET | Freq: Two times a day (BID) | ORAL | 0 refills | Status: DC

## 2020-12-16 MED ORDER — ATORVASTATIN CALCIUM 10 MG PO TABS
10.0000 mg | ORAL_TABLET | Freq: Every day | ORAL | 1 refills | Status: DC
Start: 2020-12-16 — End: 2021-02-22

## 2020-12-16 MED ORDER — PANTOPRAZOLE SODIUM 40 MG PO TBEC: 40.0000 mg | DELAYED_RELEASE_TABLET | Freq: Every day | ORAL | 0 refills | Status: DC

## 2020-12-16 MED ORDER — SERTRALINE HCL 100 MG PO TABS: 100.0000 mg | ORAL_TABLET | Freq: Every day | ORAL | 1 refills | Status: AC

## 2020-12-16 MED ORDER — ISOSORBIDE MONONITRATE ER 30 MG PO TB24: ORAL_TABLET | ORAL | 3 refills | Status: DC

## 2020-12-16 NOTE — Telephone Encounter (Signed)
Chrinee from [facility] needs verbal orders for therapy. Please call her back at [phone]

## 2020-12-16 NOTE — Telephone Encounter (Signed)
Lattie Haw from [facility] called asking for clarification and approval on a medication. She said call the pharmacy at [phone]

## 2020-12-16 NOTE — Telephone Encounter (Signed)
Requesting transfer of pharmacy  Rx allopurinol last refill on [id number] By historical provider

## 2020-12-16 NOTE — Telephone Encounter (Signed)
Called and lm on [place] confidential vm with VO.

## 2020-12-17 ENCOUNTER — Telehealth: Payer: Self-pay

## 2020-12-17 ENCOUNTER — Other Ambulatory Visit: Payer: Self-pay | Admitting: Endocrinology

## 2020-12-17 ENCOUNTER — Telehealth: Payer: Self-pay | Admitting: Pharmacist

## 2020-12-17 DIAGNOSIS — N186 End stage renal disease: Secondary | ICD-10-CM | POA: Diagnosis not present

## 2020-12-17 DIAGNOSIS — D509 Iron deficiency anemia, unspecified: Secondary | ICD-10-CM | POA: Diagnosis not present

## 2020-12-17 DIAGNOSIS — D631 Anemia in chronic kidney disease: Secondary | ICD-10-CM | POA: Diagnosis not present

## 2020-12-17 DIAGNOSIS — E8770 Fluid overload, unspecified: Secondary | ICD-10-CM | POA: Diagnosis not present

## 2020-12-17 DIAGNOSIS — Z992 Dependence on renal dialysis: Secondary | ICD-10-CM | POA: Diagnosis not present

## 2020-12-17 MED ORDER — CLOPIDOGREL BISULFATE 75 MG PO TABS: 75.0000 mg | ORAL_TABLET | Freq: Every day | ORAL | 3 refills | Status: DC

## 2020-12-17 MED ORDER — ALLOPURINOL 100 MG PO TABS: 100.0000 mg | ORAL_TABLET | Freq: Every day | ORAL | 3 refills | Status: DC

## 2020-12-17 NOTE — Telephone Encounter (Signed)
[name] from [facility] is calling for orders for therapy for his elbow. She stated that he had a fall last week and he also needs evaluated. She would like a call back at [phone]

## 2020-12-17 NOTE — Telephone Encounter (Signed)
Spoke to David [name] with [facility], told her unfortunately I can not give any orders due to pt has not been seen since 05/2020 with Dr. [name]. Also due to new problem pt needs to be seen. [name] verbalized understanding. Told her I will contact pt to get schedule for an appt. [name] verbalized understanding.

## 2020-12-17 NOTE — Telephone Encounter (Signed)
Called [facility] pharmacy at [phone], spoke to pharmacist Bessie needed clarification for quantity on medication Allopurinol  it says 903. Told Bessie no, the quantity should be # 90 and 3 refills. Bessie verbalized understanding and will change order.

## 2020-12-17 NOTE — Telephone Encounter (Addendum)
  Encourage patient to contact the pharmacy for refills or they can request refills through [place]:  11/05/20   NEXT APPOINTMENT DATE: 12/21/20   MEDICATION: clopidogrel (PLAVIX) 75 MG tablet  PH[facility]ACY: [facility] [place], [place] [address]  Let patient know to contact pharmacy at the end of the day to make sure medication is ready.  Please notify patient to allow 48-72 hours to process

## 2020-12-17 NOTE — Telephone Encounter (Signed)
Rx sent to pharmacy as requested.

## 2020-12-17 NOTE — Addendum Note (Signed)
Addended by: Marian Sorrow on: 12/17/2020 11:27 AM   Modules accepted: Orders

## 2020-12-17 NOTE — Telephone Encounter (Signed)
Spoke to pt's wife due to pt napping. Told her I received a call from Worden with [facility] asking for orders for therapy. Told her due to pt not seen since 05/2020 could not give orders. Told her pt will need to be seen to be evaluated for his problems. Mrs. Amos verbalized understanding and said problem is pt goes to dialysis 4 x's a week could we do a virtual? Told her yes, I understand. I will have the schedulers give you a call back to get him scheduled so we can go over his problems and get him help. Mrs. Myrtie Hawk verbalized understanding.

## 2020-12-17 NOTE — Chronic Care Management (AMB) (Addendum)
Chronic Care Management Pharmacy Assistant   Name: GRIFFIN GERRARD  MRN: [id number] DOB: Oct 12, 1940  David [name] is an 80 y.o. year old male who presents for his initial CCM visit with the clinical pharmacist.  Reason for Encounter: Chart Review For Initial Visit With Clinical Pharmacist   Conditions to be addressed/monitored: HTN, CAD, PVD, CVA, OSA, GERD, HLD, IDDM, CKD, Anxiety, Gout, Debility  Primary concerns for visit include: HTN, CAD, PVD, CVA, OSA, GERD, HLD, IDDM, CKD, Anxiety, Gout, Debility  Recent office visits:  11/05/2020 Murlean Caller, Lynett Fish, FNP; acute visit for hand pain, occupational therapy, no medication changes indicated.  Recent consult visits:  11/12/2020 OV (endocrinology) Elayne Snare, MD; go back up to 1.8 mg of Victoza, Reduce Tresiba to 36 units   10/13/2020 OV (cardiology) Virl Axe, MD; no medication changes indicated.  09/30/2020 OV (podiatry) Marzetta Board, DPM; no medication changes indicated.  09/08/2020 OV (Vasc Surg) Jamelle Haring, MD; CKD nearing ESRD follow up, no medication changes indicated.  09/07/2020 OV (endocrinology) Elayne Snare, MD; novolog 8 units am and 6-8 at supper, Tyler Aas adjust based on am sugars, Victoza 1.2 mg a day.  Hospital visits:  Medication Reconciliation was completed by comparing discharge summary, patient's EMR and Pharmacy list, and upon discussion with patient.  Admitted to the hospital on 08/06/2020 for procedure (left upper extremity arteriovenous fistula branch ligation x 2) due to CKD. Discharge date was 08/06/2020. Discharged from [facility]?Medications Started at [facility] Discharge:?? -started Tramadol 50 mg due to pain.  Medications that remain the same after Hospital Discharge:??  -All other medications will remain the same.    Medications: Outpatient Encounter Medications as of 12/17/2020  Medication Sig Note   acetaminophen (TYLENOL) 650 MG CR tablet Take 650  mg by mouth every 8 (eight) hours as needed for pain.    allopurinol (ZYLOPRIM) 100 MG tablet Take 1 tablet (100 mg total) by mouth daily.    amLODipine (NORVASC) 5 MG tablet Take 1 tablet (5 mg total) by mouth daily.    atorvastatin (LIPITOR) 10 MG tablet Take 1 tablet (10 mg total) by mouth daily.    B Complex-C (B-COMPLEX WITH VITAMIN C) tablet Take 1 tablet by mouth daily.    blood glucose meter kit and supplies KIT Dispense based on patient and insurance preference. Use up to four times daily as directed. (FOR ICD-9 250.00, 250.01).    calcitRIOL (ROCALTROL) 0.25 MCG capsule Take 1 capsule (0.25 mcg total) by mouth daily.    carvedilol (COREG) 25 MG tablet Take 1 tablet (25 mg total) by mouth 2 (two) times daily with a meal.    cetirizine (ZYRTEC) 10 MG tablet Take 10 mg by mouth daily.    clopidogrel (PLAVIX) 75 MG tablet TAKE 1 TABLET BY MOUTH EVERY DAY    Epoetin Alfa (EPOGEN IJ) Inject as directed See admin instructions. Every 4 weeks    folic acid (FOLVITE) 1 MG tablet Take 1 mg by mouth daily.    glucose blood test strip Use Contour test strips as instructed to check blood sugar four times daily.    hydrALAZINE (APRESOLINE) 100 MG tablet Take 1 tablet (100 mg total) by mouth 2 (two) times daily.    insulin aspart (NOVOLOG FLEXPEN) 100 UNIT/ML FlexPen Inject 10-12 Units into the skin See admin instructions. Inject 10 units subcutaneously before breakfast and 12 units before supper 11/05/2020: 6-8 units   Insulin Syringe-Needle U-100 (INSULIN SYRINGE 1CC/31GX5/16") 31G X 5/16"  1 ML MISC 1 each by Does not apply route 3 (three) times daily. Use insulin syringe to inject insulin three times daily.    isosorbide mononitrate (IMDUR) 30 MG 24 hr tablet TAKE 2 TABLETS DAILY.    Omega-3 Fatty Acids (FISH OIL) 1000 MG CAPS Take 1 capsule (1,000 mg total) by mouth every morning.    pantoprazole (PROTONIX) 40 MG tablet Take 1 tablet (40 mg total) by mouth daily.    PRESCRIPTION MEDICATION Place into  the left eye See admin instructions. Injections at [facility] once every 4 weeks - left eye for diabetic retinopathy    sertraline (ZOLOFT) 100 MG tablet Take 1 tablet (100 mg total) by mouth daily.    sodium bicarbonate 650 MG tablet Take 2 tablets (1,300 mg total) by mouth 2 (two) times daily. (Patient not taking: Reported on 11/12/2020)    torsemide (DEMADEX) 20 MG tablet Take 40 mg by mouth in the AM and 17m by mouth in the PM 11/05/2020: 2 in the am and 1 in the evening.   traMADol (ULTRAM) 50 MG tablet Take 1 tablet (50 mg total) by mouth every 6 (six) hours as needed.    TRESIBA FLEXTOUCH 200 UNIT/ML FlexTouch Pen INJECT 44 UNITS SUBCUTANEOUSLY ONCE DAILY 11/05/2020: 38 units   VICTOZA 18 MG/3ML SOPN INJECT 1.8 MG INTO THE SKIN DAILY BEFORE SUPPER.    vitamin B-12 (CYANOCOBALAMIN) 100 MCG tablet Take 100 mcg by mouth daily.    No facility-administered encounter medications on file as of 12/17/2020.    Current Medications: Allopurinol 100 mg once a day, last filled 06/22/2020 90 DS Rocaltrol 0.25 mg once a day, last filled 06/17/2020 90 DS Pantoprazole 40 mg once a day, last filled 06/16/2020 90 DS Carvedilol 25 mg twice a day, last filled 06/16/2020 90 DS does not take morning dosage on Monday Tuesday Thursday Friday Amlodipine 5 mg once a day, last filled 06/09/2020 90 DS Atorvastatin 10 mg once a day, last filled 06/09/2020 90 DS  Hydralazine 100 mg twice a day, last filled 07/11/2020 90 DS does Sertraline 100 mg once a day, last filled 07/09/2020 90 DS Isosorbide mononitrate 30 mg two tablets daily, last filled 05/08/2020 90 DS Torsemide 20 mg, 40 mg in the am, 20 mg in the evening, last filled 07/22/2020 90 DS no longer taking this medication  Victoza 18 mg/347mlast filled 07/01/2020 90 DS Tramadol 50 mg last filled Plavix 75 mg once a day, last filled 06/16/2020 90 DS Epoetin Alfa no longer taking B-Complex no longer taking Zytrec 10 mg once a day Tresiba 200u/mL last filled  07/12/2020 41 DS Novolog Flexpen 100u/mL last filled 07/30/2020 30 DS Sodium bicarbonate 650 mg no longer taking Acetaminophen 650 mg takes as needed Folic Acid once a day Omega-3 once a day Vitamin B12 no longer taking  Patient Questions: Any changes in your medications or health? Patients wife states the patient has recently started Dialysis two weeks ago.  Any side effects from any medications?  Patients wife states the patient does not have any side effects from any of his medications.  Do you have any symptoms or problems not managed by your medications? Patients wife states the patient does not have any symptoms or problems that are not managed by his medications.  Any concerns about your health right now? Patients wife states they have concerns for the patients health as he recently started dialysis. She stated "his kidneys are gone."  Has your provider asked that you check blood  pressure, blood sugar, or follow special diet at home? Patients wife states they check both his blood pressure and blood sugar at home. She states he follows a diabetic diet.  Do you get any type of exercise on a regular basis? Patients wife states the patient does not exercise.  Can you think of a goal you would like to reach for your health? Patients wife states their goal is comfort.  Do you have any problems getting your medications? Patients wife states the patient does not have any problems getting his medications.  Is there anything that you would like to discuss during the appointment?  Patients wife states the patient does not have anything he would like to discuss.  Please bring medications and supplements to appointment.  Star Rating Drugs: Atorvastatin 10 mg once a day, last filled 06/09/2020 90 DS  Victoza 18 mg/17m last filled 07/01/2020 90 DS Tresiba 200u/mL last filled 07/12/2020 41 DS Novolog Flexpen 100u/mL last filled 07/30/2020 30 DS  April D Calhoun, [place] Pharmacist Assistant [phone]

## 2020-12-18 ENCOUNTER — Other Ambulatory Visit: Payer: Self-pay | Admitting: Endocrinology

## 2020-12-18 DIAGNOSIS — Z992 Dependence on renal dialysis: Secondary | ICD-10-CM | POA: Diagnosis not present

## 2020-12-18 DIAGNOSIS — D509 Iron deficiency anemia, unspecified: Secondary | ICD-10-CM | POA: Insufficient documentation

## 2020-12-18 DIAGNOSIS — N186 End stage renal disease: Secondary | ICD-10-CM | POA: Diagnosis not present

## 2020-12-18 DIAGNOSIS — Z794 Long term (current) use of insulin: Secondary | ICD-10-CM

## 2020-12-18 DIAGNOSIS — D631 Anemia in chronic kidney disease: Secondary | ICD-10-CM | POA: Diagnosis not present

## 2020-12-18 DIAGNOSIS — E1165 Type 2 diabetes mellitus with hyperglycemia: Secondary | ICD-10-CM

## 2020-12-18 DIAGNOSIS — E8770 Fluid overload, unspecified: Secondary | ICD-10-CM | POA: Diagnosis not present

## 2020-12-21 ENCOUNTER — Ambulatory Visit: Payer: Medicare Other | Admitting: Internal Medicine

## 2020-12-21 ENCOUNTER — Telehealth (INDEPENDENT_AMBULATORY_CARE_PROVIDER_SITE_OTHER): Payer: Medicare Other | Admitting: Family Medicine

## 2020-12-21 DIAGNOSIS — N184 Chronic kidney disease, stage 4 (severe): Secondary | ICD-10-CM

## 2020-12-21 DIAGNOSIS — N186 End stage renal disease: Secondary | ICD-10-CM | POA: Diagnosis not present

## 2020-12-21 DIAGNOSIS — D631 Anemia in chronic kidney disease: Secondary | ICD-10-CM | POA: Diagnosis not present

## 2020-12-21 DIAGNOSIS — M1990 Unspecified osteoarthritis, unspecified site: Secondary | ICD-10-CM

## 2020-12-21 DIAGNOSIS — Z992 Dependence on renal dialysis: Secondary | ICD-10-CM | POA: Diagnosis not present

## 2020-12-21 DIAGNOSIS — E8770 Fluid overload, unspecified: Secondary | ICD-10-CM | POA: Diagnosis not present

## 2020-12-21 DIAGNOSIS — D509 Iron deficiency anemia, unspecified: Secondary | ICD-10-CM | POA: Diagnosis not present

## 2020-12-21 DIAGNOSIS — R5381 Other malaise: Secondary | ICD-10-CM

## 2020-12-21 DIAGNOSIS — M25562 Pain in left knee: Secondary | ICD-10-CM

## 2020-12-21 MED ORDER — VICTOZA 18 MG/3ML ~~LOC~~ SOPN: 1.8000 mg | PEN_INJECTOR | Freq: Every day | SUBCUTANEOUS | 1 refills | Status: DC

## 2020-12-21 MED ORDER — NOVOLOG FLEXPEN 100 UNIT/ML ~~LOC~~ SOPN: 10.0000 [IU] | PEN_INJECTOR | SUBCUTANEOUS | 5 refills | Status: AC

## 2020-12-21 NOTE — Assessment & Plan Note (Signed)
Gets dialysis.  Managed by nephrology.

## 2020-12-21 NOTE — Telephone Encounter (Signed)
David [name] with  [employer] ph# [phone] called to check status of new RX requests for the following medications:   TRESIBA FLEXTOUCH 200 UNIT/ML FlexTouch Pen   VICTOZA 18 MG/3ML SOPN   insulin aspart (NOVOLOG FLEXPEN) 100 UNIT/ML FlexPen  To be sent to:  [facility], [place] - [address] Phone:  [phone]  Fax:  [phone]

## 2020-12-21 NOTE — Assessment & Plan Note (Signed)
Likely flared up.  We will check x-ray as above.  If not improving may consider referral back to sports medicine.

## 2020-12-21 NOTE — Assessment & Plan Note (Signed)
Working with PT and OT via home health.

## 2020-12-21 NOTE — Progress Notes (Signed)
   David [name] is a 80 y.o. male who presents today for a virtual office visit.  Assessment/Plan:  New/Acute Problems: Left knee pain Discussed limitations of virtual visit and inability to perform physical exam.  Likely has flareup of his osteoarthritis however given recent fall we will need to check x-ray to rule out fracture.  Recommended ice and compression as tolerated.  Recommend he try half a dose of tramadol 25 mg to see if this helps improve pain without side effects at the 50 mg dose.  Cannot take NSAIDs due to CKD.  Chronic Problems Addressed Today: Debility Working with PT and OT via home health.  Osteoarthritis Likely flared up.  We will check x-ray as above.  If not improving may consider referral back to sports medicine.  CKD (chronic kidney disease) stage 4, GFR 15-29 ml/min (HCC) Gets dialysis.  Managed by nephrology.     Subjective:  HPI:  Patient here with left knee and left elbow pain.  He fell few days ago while at dialysis.  States his left knee gave out.  He has follow-up several times over the last few weeks.  He has been working with physical therapy.  He has a skin tear to his left elbow that seems to be healing.  He also has some pain in his left knee.  This is worse when standing.  He has been using some Tylenol.  Does not like the way tramadol makes him feel.  Pain is worse with movement.        Objective/Observations  Physical Exam: Gen: NAD, resting comfortably Pulm: Normal work of breathing Neuro: Grossly normal, moves all extremities Psych: Normal affect and thought content  Virtual Visit via Video   I connected with [name] on 12/21/20 at 11:20 AM EDT by a video enabled telemedicine application and verified that I am speaking with the correct person using two identifiers. The limitations of evaluation and management by telemedicine and the availability of in person appointments were discussed. The patient expressed understanding and  agreed to proceed.   Patient location: Home Provider location: [place] participating in the virtual visit: Myself and Patient     [name], MD 12/21/2020 12:10 PM

## 2020-12-21 NOTE — Progress Notes (Signed)
Chronic Care Management Pharmacy Note  12/22/2020 Name:  David [name] MRN:  [id number] DOB:  05-Feb-1941  Summary: Initial PharmD visit.  Patient has adjusted BP meds due to dialysis.  BP remains stable.  Reports high copays on insulin but makes more than PAP programs will allow for assistance.  Taking half tablet of tramadol for knee pain pending Xray - did make him drosy but will now just dose at night.  Recommendations/Changes made from today's visit: Research other alternatives for help with insulin  Plan: FU 6 months   Subjective: David [name] is an 80 y.o. year old male who is a primary patient of [name], MD.  The CCM team was consulted for assistance with disease management and care coordination needs.    Engaged with patient by telephone for initial visit in response to provider referral for pharmacy case management and/or care coordination services.   Consent to Services:  The patient was given the following information about Chronic Care Management services today, agreed to services, and gave verbal consent: 1. CCM service includes personalized support from designated clinical staff supervised by the primary care provider, including individualized plan of care and coordination with other care providers 2. 24/7 contact phone numbers for assistance for urgent and routine care needs. 3. Service will only be billed when office clinical staff spend 20 minutes or more in a month to coordinate care. 4. Only one practitioner may furnish and bill the service in a calendar month. 5.The patient may stop CCM services at any time (effective at the end of the month) by phone call to the office staff. 6. The patient will be responsible for cost sharing (co-pay) of up to 20% of the service fee (after annual deductible is met). Patient agreed to services and consent obtained.  Patient Care Team: [name], MD as PCP - General (Family Medicine) Sueanne Margarita, MD as PCP -  Sleep Medicine (Cardiology) [name], MD as Consulting Physician (Nephrology) Elayne Snare, MD as Consulting Physician (Endocrinology) [name], MD as Consulting Physician (Cardiology) Marzetta Board, DPM as Consulting Physician (Podiatry) [place], [place], [place] as Consulting Physician (Optometry) Frann Rider, NP as Nurse Practitioner (Neurology) Madelin Rear, [facility] as Pharmacist (Pharmacist) Edythe Clarity, [facility] as Pharmacist (Pharmacist)  Recent office visits:  11/05/2020 Murlean Caller, Lynett Fish, FNP; acute visit for hand pain, occupational therapy, no medication changes indicated.   Recent consult visits:  11/12/2020 OV (endocrinology) Elayne Snare, MD; go back up to 1.8 mg of Victoza, Reduce Tresiba to 36 units   10/13/2020 OV (cardiology) Virl Axe, MD; no medication changes indicated.   09/30/2020 OV (podiatry) Marzetta Board, DPM; no medication changes indicated.   09/08/2020 OV (Vasc Surg) Jamelle Haring, MD; CKD nearing ESRD follow up, no medication changes indicated.   09/07/2020 OV (endocrinology) Elayne Snare, MD; novolog 8 units am and 6-8 at supper, Tyler Aas adjust based on am sugars, Victoza 1.2 mg a day.   Hospital visits:  Medication Reconciliation was completed by comparing discharge summary, patient's EMR and Pharmacy list, and upon discussion with patient.   Admitted to the hospital on 08/06/2020 for procedure (left upper extremity arteriovenous fistula branch ligation x 2) due to CKD. Discharge date was 08/06/2020. Discharged from [place]?Medications Started at [facility] Discharge:?? -started Tramadol 50 mg due to pain.   Medications that remain the same after Hospital Discharge:?? -All other medications will remain the same.  Objective:  Lab Results  Component Value Date   CREATININE 3.99 (H) 12/03/2020   BUN 83 (H) 12/03/2020   GFR 21.25 (L) 03/30/2020   GFRNONAA 14 (L) 12/03/2020   GFRAA 22 (L)  01/31/2020   NA 140 12/03/2020   K 4.9 12/03/2020   CALCIUM 9.0 12/03/2020   CO2 20 (L) 12/03/2020   GLUCOSE 78 12/03/2020    Lab Results  Component Value Date/Time   HGBA1C 6.7 (H) 11/10/2020 01:48 PM   HGBA1C 6.4 09/02/2020 02:09 PM   HGBA1C 11.5 (H) 10/26/2014 04:36 AM   FRUCTOSAMINE 329 (H) 05/02/2019 02:05 PM   FRUCTOSAMINE 295 (H) 12/28/2018 02:32 PM   GFR 21.25 (L) 03/30/2020 01:52 PM   GFR 25.02 (L) 12/25/2019 01:49 PM   MICROALBUR 7.3 (H) 03/05/2019 03:26 PM   MICROALBUR 11.9 (H) 10/24/2017 10:50 AM    Last diabetic Eye exam:  Lab Results  Component Value Date/Time   HMDIABEYEEXA No Retinopathy 10/21/2016 12:00 AM   HMDIABEYEEXA Retinopathy (A) 10/21/2016 12:00 AM    Last diabetic Foot exam: No results found for: HMDIABFOOTEX   Lab Results  Component Value Date   CHOL 86 11/10/2020   HDL 21.00 (L) 11/10/2020   LDLCALC 38 11/10/2020   LDLDIRECT 45.0 12/25/2019   TRIG 137.0 11/10/2020   CHOLHDL 4 11/10/2020    Hepatic Function Latest Ref Rng & Units 12/03/2020 11/10/2020 11/05/2020  Total Protein 6.5 - 8.1 g/dL - - -  Albumin 3.5 - 5.0 g/dL 3.2(L) - 3.2(L)  AST 15 - 41 U/L - - -  ALT 0 - 53 U/L - 16 -  Alk Phosphatase 38 - 126 U/L - - -  Total Bilirubin 0.3 - 1.2 mg/dL - - -  Bilirubin, Direct 0.0 - 0.3 mg/dL - - -    Lab Results  Component Value Date/Time   TSH 2.73 11/12/2020 01:30 PM   TSH 2.410 03/06/2019 02:53 PM    CBC Latest Ref Rng & Units 12/03/2020 12/03/2020 11/05/2020  WBC 4.0 - 10.5 K/uL - 7.2 -  Hemoglobin 13.0 - 17.0 g/dL 9.0(L) 9.1(L) 8.6(L)  Hematocrit 39.0 - 52.0 % - 28.0(L) -  Platelets 150 - 400 K/uL - 173 -    Lab Results  Component Value Date/Time   VD25OH 63.9 05/24/2018 12:00 AM   VD25OH 21 (L) 01/27/2017 11:56 AM    Clinical ASCVD: Yes  The ASCVD Risk score Mikey Bussing DC Jr., et al., 2013) failed to calculate for the following reasons:   The 2013 ASCVD risk score is only valid for ages 37 to 69   The patient has a prior MI or  stroke diagnosis    Depression screen [facility] 2/9 11/05/2020 07/23/2020 07/11/2019  Decreased Interest 0 0 0  Down, Depressed, Hopeless 0 0 0  PHQ - 2 Score 0 0 0  Some recent data might be hidden     Social History   Tobacco Use  Smoking Status Former   Packs/day: 2.00   Years: 30.00   Pack years: 60.00   Types: Cigarettes   Quit date: 03/03/1984   Years since quitting: 36.8  Smokeless Tobacco Never   BP Readings from Last 3 Encounters:  12/03/20 (!) 115/51  11/12/20 128/64  11/05/20 (!) 116/47   Pulse Readings from Last 3 Encounters:  12/03/20 (!) 105  11/12/20 78  11/05/20 71   Wt Readings from Last 3 Encounters:  11/12/20 255 lb 9.6 oz (115.9 kg)  11/05/20 262 lb 9.1 oz (119.1 kg)  10/13/20 262  lb 9.6 oz (119.1 kg)   BMI Readings from Last 3 Encounters:  11/12/20 31.11 kg/m  11/05/20 31.96 kg/m  10/13/20 31.96 kg/m    Assessment/Interventions: Review of patient past medical history, allergies, medications, health status, including review of consultants reports, laboratory and other test data, was performed as part of comprehensive evaluation and provision of chronic care management services.   SDOH:  (Social Determinants of Health) assessments and interventions performed: Yes  Financial Resource Strain: Low Risk    Difficulty of Paying Living Expenses: Not hard at all    SDOH Screenings   Alcohol Screen: Not on file  Depression (PHQ2-9): Low Risk    PHQ-2 Score: 0  Financial Resource Strain: Low Risk    Difficulty of Paying Living Expenses: Not hard at all  Food Insecurity: No Food Insecurity   Worried About Charity fundraiser in the Last Year: Never true   Arboriculturist in the Last Year: Never true  Housing: Low Risk    Last Housing Risk Score: 0  Physical Activity: Insufficiently Active   Days of Exercise per Week: 3 days   Minutes of Exercise per Session: 20 min  Social Connections: Socially Isolated   Frequency of Communication with Friends and  Family: Never   Frequency of Social Gatherings with Friends and Family: Once a week   Attends Religious Services: Never   Marine scientist or Organizations: No   Attends Music therapist: Never   Marital Status: Married  Stress: No Stress Concern Present   Feeling of Stress : Not at all  Tobacco Use: Medium Risk   Smoking Tobacco Use: Former   Smokeless Tobacco Use: Never  Transportation Needs: No Data processing manager (Medical): No   Lack of Transportation (Non-Medical): No    CCM Care Plan  Allergies  Allergen Reactions   Ativan [Lorazepam] Anxiety and Other (See Comments)    Pt gets more agitated   Adhesive [Tape] Other (See Comments)    blisters    Medications Reviewed Today     Reviewed by Edythe Clarity, [facility] (Pharmacist) on 12/22/20 at 1146  Med List Status: <None>   Medication Order Taking? Sig Documenting Provider Last Dose Status Informant  acetaminophen (TYLENOL) 650 MG CR tablet [id number] Yes Take 650 mg by mouth every 8 (eight) hours as needed for pain. [provider] Taking Active Spouse/Significant Other  allopurinol (ZYLOPRIM) 100 MG tablet [id number] Yes Take 1 tablet (100 mg total) by mouth daily. [name], MD Taking Active   amLODipine (NORVASC) 5 MG tablet [id number] Yes Take 1 tablet (5 mg total) by mouth daily. [name], MD Taking Active   atorvastatin (LIPITOR) 10 MG tablet [id number] Yes Take 1 tablet (10 mg total) by mouth daily. [name], MD Taking Active   B Complex-C (B-COMPLEX WITH VITAMIN C) tablet [id number] No Take 1 tablet by mouth daily.  Patient not taking: Reported on 12/22/2020   [provider] Not Taking Active Spouse/Significant Other  blood glucose meter kit and supplies KIT [id number] Yes Dispense based on patient and insurance preference. Use up to four times daily as directed. (FOR ICD-9 250.00, 250.01). Elodia Florence., MD Taking Active  Spouse/Significant Other  calcitRIOL (ROCALTROL) 0.25 MCG capsule [id number] Yes Take 1 capsule (0.25 mcg total) by mouth daily. [name], MD Taking Active   carvedilol (COREG) 25 MG tablet [id number] Yes Take 1 tablet (25 mg total) by mouth  2 (two) times daily with a meal. [name], MD Taking Active   cetirizine (ZYRTEC) 10 MG tablet [id number] Yes Take 10 mg by mouth daily. [provider] Taking Active Spouse/Significant Other  clopidogrel (PLAVIX) 75 MG tablet [id number] Yes Take 1 tablet (75 mg total) by mouth daily. [name], MD Taking Active   Epoetin Alfa Ignacia Bayley) [id number]  Inject as directed See admin instructions. Every 4 weeks [provider]  Active Spouse/Significant Other           Med Note Lia Hopping, Toniann Fail   Tue Oct 14, 3327  5:18 PM)    folic acid (FOLVITE) 1 MG tablet [id number] Yes Take 1 mg by mouth daily. [provider] Taking Active Spouse/Significant Other  glucose blood test strip [id number]  Use Contour test strips as instructed to check blood sugar four times daily. Elayne Snare, MD  Active Spouse/Significant Other  hydrALAZINE (APRESOLINE) 100 MG tablet [id number] Yes Take 1 tablet (100 mg total) by mouth 2 (two) times daily. [name], MD Taking Active   insulin aspart (NOVOLOG FLEXPEN) 100 UNIT/ML FlexPen [id number] Yes Inject 10-12 Units into the skin See admin instructions. Inject 10 units subcutaneously before breakfast and 12 units before supper Elayne Snare, MD Taking Active   insulin degludec (TRESIBA FLEXTOUCH) 200 UNIT/ML FlexTouch Pen [id number] Yes INJECT 42 UNITS SUBCUTANEOUSLY ONCE DAILY Elayne Snare, MD Taking Active   Insulin Syringe-Needle U-100 (INSULIN SYRINGE 1CC/31GX5/16") 31G X 5/16" 1 ML MISC [id number]  1 each by Does not apply route 3 (three) times daily. Use insulin syringe to inject insulin three times daily. Elayne Snare, MD  Active Spouse/Significant Other  isosorbide mononitrate (IMDUR) 30 MG 24 hr  tablet [id number] Yes TAKE 2 TABLETS DAILY. [name], MD Taking Active   liraglutide (VICTOZA) 18 MG/3ML Bonney Aid [id number] Yes Inject 1.8 mg into the skin daily before supper. Elayne Snare, MD Taking Active   Omega-3 Fatty Acids (FISH OIL) 1000 MG CAPS [id number] Yes Take 1 capsule (1,000 mg total) by mouth every morning. Garvin Fila, MD Taking Active   pantoprazole (PROTONIX) 40 MG tablet [id number] Yes Take 1 tablet (40 mg total) by mouth daily. [name], MD Taking Active   PRESCRIPTION MEDICATION [id number] Yes Place into the left eye See admin instructions. Injections at [facility] once every 4 weeks - left eye for diabetic retinopathy [provider] Taking Active Spouse/Significant Other           Med Note Lenor Derrick Jul 27, 2020  5:42 PM)    sertraline (ZOLOFT) 100 MG tablet [id number] Yes Take 1 tablet (100 mg total) by mouth daily. [name], MD Taking Active   sodium bicarbonate 650 MG tablet [id number] No Take 2 tablets (1,300 mg total) by mouth 2 (two) times daily.  Patient not taking: Reported on 12/22/2020   Domenic Polite, MD Not Taking Active   traMADol Veatrice Bourbon) 50 MG tablet [id number] Yes Take 1 tablet (50 mg total) by mouth every 6 (six) hours as needed. Gabriel Earing, PA-C Taking Active   vitamin B-12 (CYANOCOBALAMIN) 100 MCG tablet [id number] Yes Take 100 mcg by mouth daily. 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 ([place])  09/04/2017   Nonobstructive CAD s/p PCI 2012    Cardiac pacemaker in situ    PVD (peripheral vascular disease) ([place])    Anxiety 03/22/2017   Acute cholecystitis s/p lap cholecystectomy 03/05/2017 03/04/2017   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[place]) 03/04/2017   IDDM (insulin dependent diabetes mellitus) - on insulin pump 03/04/2017   Fatigue 01/27/2017   Low vitamin B12 level 01/27/2017   OSA on CPAP 10/26/2014   Seasonal and perennial allergic rhinitis 10/25/2013   Hypertension associated with diabetes ([place]) 10/25/2013   Hyperlipidemia associated with type 2 diabetes mellitus ([place]) 10/25/2013    Immunization History  Administered Date(s) Administered   Fluad Quad(high Dose 65+) 03/05/2019   Influenza, High Dose Seasonal PF 02/09/2017   Influenza,inj,Quad PF,6+ Mos 01/30/2014   Influenza-Unspecified 03/17/2015, 04/15/2016, 01/14/2018, 02/11/2020   PFIZER(Purple Top)SARS-COV-2 Vaccination 06/21/2019, 07/16/2019, 02/22/2020    Conditions to be addressed/monitored:  HTN, CAD, GERD, HLD, Diabetes, Anxiety  Care Plan : General Pharmacy (Adult)  Updates made by Edythe Clarity, RPH since 12/22/2020 12:00 AM     Problem: HTN, CAD, GERD, HLD, Diabetes, Anxiety   Priority: High  Onset Date: 12/22/2020     Long-Range Goal: Patient-Specific Goal   Start Date: 12/22/2020  Expected End Date: 06/24/2021  This Visit's Progress: On track  Priority: High  Note:   Current Barriers:  Patient going through dialysis treatments  Pharmacist Clinical Goal(s):  Patient will maintain control of BP as evidenced by home monitoring  contact provider office for questions/concerns as evidenced notation of same in electronic health record through collaboration with PharmD and provider.   Interventions: 1:1 collaboration with [name], MD regarding development and update of comprehensive plan of care as evidenced by provider attestation and co-signature Inter-disciplinary care team  collaboration (see longitudinal plan of care) Comprehensive medication review performed; medication list updated in electronic medical record  Hypertension  (Status:Goal on track: YES.)   Med Management Intervention: Meds reviewed  (BP goal <130/80) -Controlled -Current treatment: Amlodipine 30m daily Hydralazine 1062m- M, Tu, Th, Fri -only taking once a day and BID all other days Imdur 3013m4hr daily Carvedilol 47m62mM, Tu, Th, Fri -only taking once a day and BID all other days -Medications previously tried: none noted  -Current home readings: "normal" - has it checked a few times per week at dialysis -Current dietary habits: appetite is decreased, see DM -Current exercise habits: minimal to none -Reports hypotensive/hypertensive symptoms - one episode of dizziness recently at dailysis which BP was low -Educated on BP goals and benefits of medications for prevention of heart attack, stroke and kidney damage; Daily salt intake goal < 2300 mg; Importance of home blood pressure monitoring; -Counseled to monitor BP at home if symptomatic, document, and provide log at future appointments -Recommended to continue current medication  Hyperlipidemia: (LDL goal < 100) -Controlled -Current treatment: Atorvastatin 10mg75mly -Most recent LDL excellent -Educated on Cholesterol goals;  Benefits of statin for ASCVD risk reduction; Importance of limiting foods high in cholesterol; -Recommended to continue current medication  Diabetes (A1c goal <7%) -Controlled -Current medications: Tresiba 44 units once daily - evening (38 units last night) Victoza 18mg/57mdaily Novolog 10-12 units before breakfast and 12 at supper -Medications previously  tried: Humalog 75/25 mix  -Current home glucose readings fasting glucose: 72 this am, most have been up and down, does report a few lower redings just not sure of exact numbers post prandial glucose: < 200 mostly -Denies hypoglycemic/hyperglycemic  symptoms -Current meal patterns:  breakfast: eggs, sausage, pancakes  lunch: glucerna, pb and J sandwich  dinner: home cooked meal with meat, vegetable, starch snacks: banana, crackers, cashews drinks: water, rarely regular soda -Current exercise: minimal -Educated on A1c and blood sugar goals; Prevention and management of hypoglycemic episodes; Benefits of routine self-monitoring of blood sugar; When to call if sugars are low -Counseled to check feet daily and get yearly eye exams -Wife is adjusting insulin doses at night and mealtime based on home monitoring at direction of endocrine - she has a good understanding of how to do this. -Recommended to continue current medication Recommended she contact providers if he continues to have low sugars in the morning.  Anxiety (Goal: Minimize symptoms) -Controlled -Current treatment: Sertraline 165m daily -Medications previously tried/failed: Lorazepam, hydroxyzine -PHQ9:  PHQ9 SCORE ONLY 11/05/2020 07/23/2020 07/11/2019  PHQ-9 Total Score 0 0 0   -GAD7: No flowsheet data found. -Educated on Benefits of medication for symptom control -Recommended to continue current medication -Wife reports mood is stable  Osteoarthritis(Goal: Minimize symptoms) -Controlled -Current treatment  Tramadol 5968m- currently using a half tablet at night to manage pain -Medications previously tried: none noted -Wife gave him a half tablet yesterday and it made him feel a little drowsy.  She will not move to just night time dosing to avoid this.  X ray of knee has been ordered to rule out fracture. -Recommended to continue current medication  Patient Goals/Self-Care Activities Patient will:  - take medications as prescribed focus on medication adherence by pill box check glucose daily, document, and provide at future appointments  Follow Up Plan: The care management team will reach out to the patient again over the next 180 days.         Medication  Assistance: None required.  Patient affirms current coverage meets needs.  Compliance/Adherence/Medication fill history: Care Gaps: Zoster Vaccine  Star-Rating Drugs: Atorvastatin 10 mg once a day, last filled 06/09/2020 90 DS Victoza 18 mg/68m768mast filled 07/01/2020 90 DS Tresiba 200u/mL last filled 07/12/2020 41 DS Novolog Flexpen 100u/mL last filled 07/30/2020 30 DS  Patient[facility]  [name]: [phone]: [phone] [place] [id number] [place]: [phone]: [phone] pill box? Yes - wife organizes a week at a time Pt endorses 100% compliance  We discussed: Benefits of medication synchronization, packaging and delivery as well as enhanced pharmacist oversight with [employer]. Patient decided to: Continue current medication management strategy  Care Plan and Follow Up Patient Decision:  Patient agrees to Care Plan and Follow-up.  Plan: The care management team will reach out to the patient again over the next 180 days.  ChriBeverly MilcharmD Clinical Pharmacist  ([phone]

## 2020-12-22 ENCOUNTER — Ambulatory Visit (INDEPENDENT_AMBULATORY_CARE_PROVIDER_SITE_OTHER): Payer: Medicare Other | Admitting: Pharmacist

## 2020-12-22 DIAGNOSIS — M1990 Unspecified osteoarthritis, unspecified site: Secondary | ICD-10-CM

## 2020-12-22 DIAGNOSIS — E8770 Fluid overload, unspecified: Secondary | ICD-10-CM | POA: Diagnosis not present

## 2020-12-22 DIAGNOSIS — E785 Hyperlipidemia, unspecified: Secondary | ICD-10-CM | POA: Diagnosis not present

## 2020-12-22 DIAGNOSIS — D509 Iron deficiency anemia, unspecified: Secondary | ICD-10-CM | POA: Diagnosis not present

## 2020-12-22 DIAGNOSIS — Z992 Dependence on renal dialysis: Secondary | ICD-10-CM | POA: Diagnosis not present

## 2020-12-22 DIAGNOSIS — D631 Anemia in chronic kidney disease: Secondary | ICD-10-CM | POA: Diagnosis not present

## 2020-12-22 DIAGNOSIS — E1159 Type 2 diabetes mellitus with other circulatory complications: Secondary | ICD-10-CM | POA: Diagnosis not present

## 2020-12-22 DIAGNOSIS — N186 End stage renal disease: Secondary | ICD-10-CM | POA: Diagnosis not present

## 2020-12-22 DIAGNOSIS — E1169 Type 2 diabetes mellitus with other specified complication: Secondary | ICD-10-CM | POA: Diagnosis not present

## 2020-12-22 DIAGNOSIS — I152 Hypertension secondary to endocrine disorders: Secondary | ICD-10-CM

## 2020-12-22 DIAGNOSIS — K219 Gastro-esophageal reflux disease without esophagitis: Secondary | ICD-10-CM

## 2020-12-22 NOTE — Patient Instructions (Addendum)
Visit Information   Goals Addressed             This Visit's Progress    Manage My Medicine       Timeframe:  Long-Range Goal Priority:  High Start Date:    12/22/20                         Expected End Date:  06/24/21                     Follow Up Date 04/14/21    - keep a list of all the medicines I take; vitamins and herbals too    Why is this important?   These steps will help you keep on track with your medicines.   Notes:          Patient Care Plan: General Pharmacy (Adult)     Problem Identified: HTN, CAD, GERD, HLD, Diabetes, Anxiety   Priority: High  Onset Date: 12/22/2020     Long-Range Goal: Patient-Specific Goal   Start Date: 12/22/2020  Expected End Date: 06/24/2021  This Visit's Progress: On track  Priority: High  Note:   Current Barriers:  Patient going through dialysis treatments  Pharmacist Clinical Goal(s):  Patient will maintain control of BP as evidenced by home monitoring  contact provider office for questions/concerns as evidenced notation of same in electronic health record through collaboration with PharmD and provider.   Interventions: 1:1 collaboration with [name], MD regarding development and update of comprehensive plan of care as evidenced by provider attestation and co-signature Inter-disciplinary care team collaboration (see longitudinal plan of care) Comprehensive medication review performed; medication list updated in electronic medical record  Hypertension  (Status:Goal on track: YES.)   Med Management Intervention: Meds reviewed  (BP goal <130/80) -Controlled -Current treatment: Amlodipine 5mg  daily Hydralazine 100mg  - M, Tu, Th, Fri -only taking once a day and BID all other days Imdur 30mg /24hr daily Carvedilol 25mg  - M, Tu, Th, Fri -only taking once a day and BID all other days -Medications previously tried: none noted  -Current home readings: "normal" - has it checked a few times per week at dialysis -Current  dietary habits: appetite is decreased, see DM -Current exercise habits: minimal to none -Reports hypotensive/hypertensive symptoms - one episode of dizziness recently at dailysis which BP was low -Educated on BP goals and benefits of medications for prevention of heart attack, stroke and kidney damage; Daily salt intake goal < 2300 mg; Importance of home blood pressure monitoring; -Counseled to monitor BP at home if symptomatic, document, and provide log at future appointments -Recommended to continue current medication  Hyperlipidemia: (LDL goal < 100) -Controlled -Current treatment: Atorvastatin 10mg  daily -Most recent LDL excellent -Educated on Cholesterol goals;  Benefits of statin for ASCVD risk reduction; Importance of limiting foods high in cholesterol; -Recommended to continue current medication  Diabetes (A1c goal <7%) -Controlled -Current medications: Tresiba 44 units once daily - evening (38 units last night) Victoza 18mg /38ml daily Novolog 10-12 units before breakfast and 12 at supper -Medications previously tried: Humalog 75/25 mix  -Current home glucose readings fasting glucose: 72 this am, most have been up and down, does report a few lower redings just not sure of exact numbers post prandial glucose: < 200 mostly -Denies hypoglycemic/hyperglycemic symptoms -Current meal patterns:  breakfast: eggs, sausage, pancakes  lunch: glucerna, pb and J sandwich  dinner: home cooked meal with meat, vegetable, starch snacks: banana, crackers, cashews  drinks: water, rarely regular soda -Current exercise: minimal -Educated on A1c and blood sugar goals; Prevention and management of hypoglycemic episodes; Benefits of routine self-monitoring of blood sugar; When to call if sugars are low -Counseled to check feet daily and get yearly eye exams -Wife is adjusting insulin doses at night and mealtime based on home monitoring at direction of endocrine - she has a good understanding  of how to do this. -Recommended to continue current medication Recommended she contact providers if he continues to have low sugars in the morning.  Anxiety (Goal: Minimize symptoms) -Controlled -Current treatment: Sertraline 100mg  daily -Medications previously tried/failed: Lorazepam, hydroxyzine -PHQ9:  PHQ9 SCORE ONLY 11/05/2020 07/23/2020 07/11/2019  PHQ-9 Total Score 0 0 0   -GAD7: No flowsheet data found. -Educated on Benefits of medication for symptom control -Recommended to continue current medication -Wife reports mood is stable  Osteoarthritis(Goal: Minimize symptoms) -Controlled -Current treatment  Tramadol 50mg  - currently using a half tablet at night to manage pain -Medications previously tried: none noted -Wife gave him a half tablet yesterday and it made him feel a little drowsy.  She will not move to just night time dosing to avoid this.  X ray of knee has been ordered to rule out fracture. -Recommended to continue current medication  Patient Goals/Self-Care Activities Patient will:  - take medications as prescribed focus on medication adherence by pill box check glucose daily, document, and provide at future appointments  Follow Up Plan: The care management team will reach out to the patient again over the next 180 days.          Mr. Hargadon was given information about Chronic Care Management services today including:  CCM service includes personalized support from designated clinical staff supervised by his physician, including individualized plan of care and coordination with other care providers 24/7 contact phone numbers for assistance for urgent and routine care needs. Standard insurance, coinsurance, copays and deductibles apply for chronic care management only during months in which we provide at least 20 minutes of these services. Most insurances cover these services at 100%, however patients may be responsible for any copay, coinsurance and/or  deductible if applicable. This service may help you avoid the need for more expensive face-to-face services. Only one practitioner may furnish and bill the service in a calendar month. The patient may stop CCM services at any time (effective at the end of the month) by phone call to the office staff.  Patient agreed to services and verbal consent obtained.   The patient verbalized understanding of instructions, educational materials, and care plan provided today and agreed to receive a mailed copy of patient instructions, educational materials, and care plan.  Telephone follow up appointment with pharmacy team member scheduled for: 6 months  Edythe Clarity, [place]

## 2020-12-23 ENCOUNTER — Other Ambulatory Visit: Payer: Self-pay | Admitting: Family Medicine

## 2020-12-23 ENCOUNTER — Encounter: Payer: Self-pay | Admitting: Family Medicine

## 2020-12-23 DIAGNOSIS — Z992 Dependence on renal dialysis: Secondary | ICD-10-CM | POA: Diagnosis not present

## 2020-12-23 DIAGNOSIS — I120 Hypertensive chronic kidney disease with stage 5 chronic kidney disease or end stage renal disease: Secondary | ICD-10-CM | POA: Diagnosis not present

## 2020-12-23 DIAGNOSIS — I69318 Other symptoms and signs involving cognitive functions following cerebral infarction: Secondary | ICD-10-CM | POA: Diagnosis not present

## 2020-12-23 DIAGNOSIS — I480 Paroxysmal atrial fibrillation: Secondary | ICD-10-CM | POA: Diagnosis not present

## 2020-12-23 DIAGNOSIS — E1122 Type 2 diabetes mellitus with diabetic chronic kidney disease: Secondary | ICD-10-CM | POA: Diagnosis not present

## 2020-12-23 DIAGNOSIS — N186 End stage renal disease: Secondary | ICD-10-CM | POA: Diagnosis not present

## 2020-12-23 DIAGNOSIS — T82848D Pain from vascular prosthetic devices, implants and grafts, subsequent encounter: Secondary | ICD-10-CM | POA: Diagnosis not present

## 2020-12-23 DIAGNOSIS — Z794 Long term (current) use of insulin: Secondary | ICD-10-CM | POA: Diagnosis not present

## 2020-12-23 DIAGNOSIS — I2510 Atherosclerotic heart disease of native coronary artery without angina pectoris: Secondary | ICD-10-CM | POA: Diagnosis not present

## 2020-12-23 DIAGNOSIS — R531 Weakness: Secondary | ICD-10-CM | POA: Diagnosis not present

## 2020-12-23 DIAGNOSIS — M79642 Pain in left hand: Secondary | ICD-10-CM | POA: Diagnosis not present

## 2020-12-24 ENCOUNTER — Telehealth: Payer: Self-pay

## 2020-12-24 DIAGNOSIS — M79642 Pain in left hand: Secondary | ICD-10-CM | POA: Diagnosis not present

## 2020-12-24 DIAGNOSIS — T782XXA Anaphylactic shock, unspecified, initial encounter: Secondary | ICD-10-CM | POA: Insufficient documentation

## 2020-12-24 DIAGNOSIS — D631 Anemia in chronic kidney disease: Secondary | ICD-10-CM | POA: Diagnosis not present

## 2020-12-24 DIAGNOSIS — Z992 Dependence on renal dialysis: Secondary | ICD-10-CM | POA: Diagnosis not present

## 2020-12-24 DIAGNOSIS — N186 End stage renal disease: Secondary | ICD-10-CM | POA: Diagnosis not present

## 2020-12-24 DIAGNOSIS — T829XXA Unspecified complication of cardiac and vascular prosthetic device, implant and graft, initial encounter: Secondary | ICD-10-CM | POA: Insufficient documentation

## 2020-12-24 DIAGNOSIS — E8770 Fluid overload, unspecified: Secondary | ICD-10-CM | POA: Diagnosis not present

## 2020-12-24 DIAGNOSIS — D509 Iron deficiency anemia, unspecified: Secondary | ICD-10-CM | POA: Diagnosis not present

## 2020-12-24 DIAGNOSIS — R531 Weakness: Secondary | ICD-10-CM | POA: Diagnosis not present

## 2020-12-24 DIAGNOSIS — E1122 Type 2 diabetes mellitus with diabetic chronic kidney disease: Secondary | ICD-10-CM | POA: Diagnosis not present

## 2020-12-24 DIAGNOSIS — T82848D Pain from vascular prosthetic devices, implants and grafts, subsequent encounter: Secondary | ICD-10-CM | POA: Diagnosis not present

## 2020-12-24 DIAGNOSIS — I120 Hypertensive chronic kidney disease with stage 5 chronic kidney disease or end stage renal disease: Secondary | ICD-10-CM | POA: Diagnosis not present

## 2020-12-24 DIAGNOSIS — T7840XA Allergy, unspecified, initial encounter: Secondary | ICD-10-CM | POA: Insufficient documentation

## 2020-12-24 DIAGNOSIS — I69318 Other symptoms and signs involving cognitive functions following cerebral infarction: Secondary | ICD-10-CM | POA: Diagnosis not present

## 2020-12-24 NOTE — Telephone Encounter (Signed)
Please advise for fax/verbal orders for Nurse and Education officer, museum for Home health visits.

## 2020-12-24 NOTE — Telephone Encounter (Signed)
David [name] from [facility] is calling to report a fall on Tuesday night trying to get back into bed has bruising and skin tear on  left arm [phone]

## 2020-12-24 NOTE — Telephone Encounter (Signed)
FYI

## 2020-12-25 DIAGNOSIS — N186 End stage renal disease: Secondary | ICD-10-CM | POA: Diagnosis not present

## 2020-12-25 DIAGNOSIS — E8770 Fluid overload, unspecified: Secondary | ICD-10-CM | POA: Diagnosis not present

## 2020-12-25 DIAGNOSIS — D509 Iron deficiency anemia, unspecified: Secondary | ICD-10-CM | POA: Diagnosis not present

## 2020-12-25 DIAGNOSIS — D631 Anemia in chronic kidney disease: Secondary | ICD-10-CM | POA: Diagnosis not present

## 2020-12-25 DIAGNOSIS — Z992 Dependence on renal dialysis: Secondary | ICD-10-CM | POA: Diagnosis not present

## 2020-12-28 DIAGNOSIS — E1122 Type 2 diabetes mellitus with diabetic chronic kidney disease: Secondary | ICD-10-CM | POA: Diagnosis not present

## 2020-12-28 DIAGNOSIS — T82848D Pain from vascular prosthetic devices, implants and grafts, subsequent encounter: Secondary | ICD-10-CM | POA: Diagnosis not present

## 2020-12-28 DIAGNOSIS — D509 Iron deficiency anemia, unspecified: Secondary | ICD-10-CM | POA: Diagnosis not present

## 2020-12-28 DIAGNOSIS — D631 Anemia in chronic kidney disease: Secondary | ICD-10-CM | POA: Diagnosis not present

## 2020-12-28 DIAGNOSIS — R531 Weakness: Secondary | ICD-10-CM | POA: Diagnosis not present

## 2020-12-28 DIAGNOSIS — N186 End stage renal disease: Secondary | ICD-10-CM | POA: Diagnosis not present

## 2020-12-28 DIAGNOSIS — E8770 Fluid overload, unspecified: Secondary | ICD-10-CM | POA: Diagnosis not present

## 2020-12-28 DIAGNOSIS — Z992 Dependence on renal dialysis: Secondary | ICD-10-CM | POA: Diagnosis not present

## 2020-12-28 DIAGNOSIS — I120 Hypertensive chronic kidney disease with stage 5 chronic kidney disease or end stage renal disease: Secondary | ICD-10-CM | POA: Diagnosis not present

## 2020-12-28 DIAGNOSIS — M79642 Pain in left hand: Secondary | ICD-10-CM | POA: Diagnosis not present

## 2020-12-28 DIAGNOSIS — I69318 Other symptoms and signs involving cognitive functions following cerebral infarction: Secondary | ICD-10-CM | POA: Diagnosis not present

## 2020-12-29 ENCOUNTER — Other Ambulatory Visit: Payer: Self-pay

## 2020-12-29 DIAGNOSIS — Z992 Dependence on renal dialysis: Secondary | ICD-10-CM | POA: Diagnosis not present

## 2020-12-29 DIAGNOSIS — D631 Anemia in chronic kidney disease: Secondary | ICD-10-CM | POA: Diagnosis not present

## 2020-12-29 DIAGNOSIS — D509 Iron deficiency anemia, unspecified: Secondary | ICD-10-CM | POA: Diagnosis not present

## 2020-12-29 DIAGNOSIS — N186 End stage renal disease: Secondary | ICD-10-CM | POA: Diagnosis not present

## 2020-12-29 DIAGNOSIS — E8770 Fluid overload, unspecified: Secondary | ICD-10-CM | POA: Diagnosis not present

## 2020-12-30 DIAGNOSIS — I69318 Other symptoms and signs involving cognitive functions following cerebral infarction: Secondary | ICD-10-CM | POA: Diagnosis not present

## 2020-12-30 DIAGNOSIS — M79642 Pain in left hand: Secondary | ICD-10-CM | POA: Diagnosis not present

## 2020-12-30 DIAGNOSIS — R531 Weakness: Secondary | ICD-10-CM | POA: Diagnosis not present

## 2020-12-30 DIAGNOSIS — T82848D Pain from vascular prosthetic devices, implants and grafts, subsequent encounter: Secondary | ICD-10-CM | POA: Diagnosis not present

## 2020-12-30 DIAGNOSIS — E1122 Type 2 diabetes mellitus with diabetic chronic kidney disease: Secondary | ICD-10-CM | POA: Diagnosis not present

## 2020-12-30 DIAGNOSIS — I120 Hypertensive chronic kidney disease with stage 5 chronic kidney disease or end stage renal disease: Secondary | ICD-10-CM | POA: Diagnosis not present

## 2020-12-31 ENCOUNTER — Inpatient Hospital Stay (HOSPITAL_COMMUNITY): Admission: RE | Admit: 2020-12-31 | Payer: Medicare Other | Source: Ambulatory Visit

## 2020-12-31 DIAGNOSIS — N186 End stage renal disease: Secondary | ICD-10-CM | POA: Diagnosis not present

## 2020-12-31 DIAGNOSIS — D509 Iron deficiency anemia, unspecified: Secondary | ICD-10-CM | POA: Diagnosis not present

## 2020-12-31 DIAGNOSIS — Z992 Dependence on renal dialysis: Secondary | ICD-10-CM | POA: Diagnosis not present

## 2020-12-31 DIAGNOSIS — D631 Anemia in chronic kidney disease: Secondary | ICD-10-CM | POA: Diagnosis not present

## 2020-12-31 DIAGNOSIS — E8770 Fluid overload, unspecified: Secondary | ICD-10-CM | POA: Diagnosis not present

## 2021-01-01 DIAGNOSIS — E8770 Fluid overload, unspecified: Secondary | ICD-10-CM | POA: Diagnosis not present

## 2021-01-01 DIAGNOSIS — I120 Hypertensive chronic kidney disease with stage 5 chronic kidney disease or end stage renal disease: Secondary | ICD-10-CM | POA: Diagnosis not present

## 2021-01-01 DIAGNOSIS — T82848D Pain from vascular prosthetic devices, implants and grafts, subsequent encounter: Secondary | ICD-10-CM | POA: Diagnosis not present

## 2021-01-01 DIAGNOSIS — D509 Iron deficiency anemia, unspecified: Secondary | ICD-10-CM | POA: Diagnosis not present

## 2021-01-01 DIAGNOSIS — I69318 Other symptoms and signs involving cognitive functions following cerebral infarction: Secondary | ICD-10-CM | POA: Diagnosis not present

## 2021-01-01 DIAGNOSIS — Z992 Dependence on renal dialysis: Secondary | ICD-10-CM | POA: Diagnosis not present

## 2021-01-01 DIAGNOSIS — R531 Weakness: Secondary | ICD-10-CM | POA: Diagnosis not present

## 2021-01-01 DIAGNOSIS — N186 End stage renal disease: Secondary | ICD-10-CM | POA: Diagnosis not present

## 2021-01-01 DIAGNOSIS — E1122 Type 2 diabetes mellitus with diabetic chronic kidney disease: Secondary | ICD-10-CM | POA: Diagnosis not present

## 2021-01-01 DIAGNOSIS — D631 Anemia in chronic kidney disease: Secondary | ICD-10-CM | POA: Diagnosis not present

## 2021-01-01 DIAGNOSIS — M79642 Pain in left hand: Secondary | ICD-10-CM | POA: Diagnosis not present

## 2021-01-04 ENCOUNTER — Telehealth: Payer: Self-pay

## 2021-01-04 ENCOUNTER — Telehealth: Payer: Self-pay | Admitting: Pharmacist

## 2021-01-04 DIAGNOSIS — D631 Anemia in chronic kidney disease: Secondary | ICD-10-CM | POA: Diagnosis not present

## 2021-01-04 DIAGNOSIS — N186 End stage renal disease: Secondary | ICD-10-CM | POA: Diagnosis not present

## 2021-01-04 DIAGNOSIS — Z992 Dependence on renal dialysis: Secondary | ICD-10-CM | POA: Diagnosis not present

## 2021-01-04 DIAGNOSIS — D509 Iron deficiency anemia, unspecified: Secondary | ICD-10-CM | POA: Diagnosis not present

## 2021-01-04 DIAGNOSIS — E8770 Fluid overload, unspecified: Secondary | ICD-10-CM | POA: Diagnosis not present

## 2021-01-04 NOTE — Telephone Encounter (Signed)
Patient is scheduled   

## 2021-01-04 NOTE — Progress Notes (Signed)
    Chronic Care Management Pharmacy Assistant   Name: David [name]  MRN: [id number] DOB: [birth year]  Reason for Encounter: CCM Care Plan  Medications: Outpatient Encounter Medications as of 01/04/2021  Medication Sig   acetaminophen (TYLENOL) 650 MG CR tablet Take 650 mg by mouth every 8 (eight) hours as needed for pain.   allopurinol (ZYLOPRIM) 100 MG tablet Take 1 tablet (100 mg total) by mouth daily.   amLODipine (NORVASC) 5 MG tablet Take 1 tablet (5 mg total) by mouth daily.   atorvastatin (LIPITOR) 10 MG tablet Take 1 tablet (10 mg total) by mouth daily.   B Complex-C (B-COMPLEX WITH VITAMIN C) tablet Take 1 tablet by mouth daily. (Patient not taking: Reported on 12/22/2020)   blood glucose meter kit and supplies KIT Dispense based on patient and insurance preference. Use up to four times daily as directed. (FOR ICD-9 250.00, 250.01).   calcitRIOL (ROCALTROL) 0.25 MCG capsule Take 1 capsule (0.25 mcg total) by mouth daily.   carvedilol (COREG) 25 MG tablet Take 1 tablet (25 mg total) by mouth 2 (two) times daily with a meal.   cetirizine (ZYRTEC) 10 MG tablet Take 10 mg by mouth daily.   clopidogrel (PLAVIX) 75 MG tablet Take 1 tablet (75 mg total) by mouth daily.   Epoetin Alfa (EPOGEN IJ) Inject as directed See admin instructions. Every 4 weeks   folic acid (FOLVITE) 1 MG tablet Take 1 mg by mouth daily.   glucose blood test strip Use Contour test strips as instructed to check blood sugar four times daily.   hydrALAZINE (APRESOLINE) 100 MG tablet Take 1 tablet (100 mg total) by mouth 2 (two) times daily.   insulin aspart (NOVOLOG FLEXPEN) 100 UNIT/ML FlexPen Inject 10-12 Units into the skin See admin instructions. Inject 10 units subcutaneously before breakfast and 12 units before supper   insulin degludec (TRESIBA FLEXTOUCH) 200 UNIT/ML FlexTouch Pen INJECT 44 UNITS SUBCUTANEOUSLY ONCE DAILY   Insulin Syringe-Needle U-100 (INSULIN SYRINGE 1CC/31GX5/16") 31G X 5/16" 1 ML MISC 1  each by Does not apply route 3 (three) times daily. Use insulin syringe to inject insulin three times daily.   isosorbide mononitrate (IMDUR) 30 MG 24 hr tablet TAKE 2 TABLETS DAILY.   liraglutide (VICTOZA) 18 MG/3ML SOPN Inject 1.8 mg into the skin daily before supper.   Omega-3 Fatty Acids (FISH OIL) 1000 MG CAPS Take 1 capsule (1,000 mg total) by mouth every morning.   pantoprazole (PROTONIX) 40 MG tablet Take 1 tablet (40 mg total) by mouth daily.   PRESCRIPTION MEDICATION Place into the left eye See admin instructions. Injections at [facility] once every 4 weeks - left eye for diabetic retinopathy   sertraline (ZOLOFT) 100 MG tablet Take 1 tablet (100 mg total) by mouth daily.   sodium bicarbonate 650 MG tablet Take 2 tablets (1,300 mg total) by mouth 2 (two) times daily. (Patient not taking: Reported on 12/22/2020)   traMADol (ULTRAM) 50 MG tablet Take 1 tablet (50 mg total) by mouth every 6 (six) hours as needed.   vitamin B-12 (CYANOCOBALAMIN) 100 MCG tablet Take 100 mcg by mouth daily.   No facility-administered encounter medications on file as of 01/04/2021.   Reviewed the patients initial visit reinsured it was completed per the pharmacist Leata Mouse request. Printed the CCM care plan. Mailed the patient CCM care plan to their most recent address on file.  Follow-Up:Pharmacist Review  Charlann Lange, [place] Pharmacist Assistant [phone]

## 2021-01-04 NOTE — Telephone Encounter (Signed)
Called and spoke with Nira Conn and VO given.  Please schedule F2F so that orders can be signed.

## 2021-01-04 NOTE — Telephone Encounter (Signed)
Home Health Verbal Orders  Agency:  [facility]   Caller: Engineer, maintenance (IT) and title  Requesting  Wound Care   Reason for Request: Nursing assessment was done and found a blister   Frequency: twice a week for two weeks // once a week for one week    HH needs F2F w/in last 30 days

## 2021-01-05 DIAGNOSIS — D631 Anemia in chronic kidney disease: Secondary | ICD-10-CM | POA: Diagnosis not present

## 2021-01-05 DIAGNOSIS — N186 End stage renal disease: Secondary | ICD-10-CM | POA: Diagnosis not present

## 2021-01-05 DIAGNOSIS — E8770 Fluid overload, unspecified: Secondary | ICD-10-CM | POA: Diagnosis not present

## 2021-01-05 DIAGNOSIS — Z992 Dependence on renal dialysis: Secondary | ICD-10-CM | POA: Diagnosis not present

## 2021-01-05 DIAGNOSIS — D509 Iron deficiency anemia, unspecified: Secondary | ICD-10-CM | POA: Diagnosis not present

## 2021-01-06 ENCOUNTER — Ambulatory Visit: Payer: Medicare Other | Admitting: Podiatry

## 2021-01-06 DIAGNOSIS — R531 Weakness: Secondary | ICD-10-CM | POA: Diagnosis not present

## 2021-01-06 DIAGNOSIS — E1122 Type 2 diabetes mellitus with diabetic chronic kidney disease: Secondary | ICD-10-CM | POA: Diagnosis not present

## 2021-01-06 DIAGNOSIS — I120 Hypertensive chronic kidney disease with stage 5 chronic kidney disease or end stage renal disease: Secondary | ICD-10-CM | POA: Diagnosis not present

## 2021-01-06 DIAGNOSIS — I69318 Other symptoms and signs involving cognitive functions following cerebral infarction: Secondary | ICD-10-CM | POA: Diagnosis not present

## 2021-01-06 DIAGNOSIS — T82848D Pain from vascular prosthetic devices, implants and grafts, subsequent encounter: Secondary | ICD-10-CM | POA: Diagnosis not present

## 2021-01-06 DIAGNOSIS — M79642 Pain in left hand: Secondary | ICD-10-CM | POA: Diagnosis not present

## 2021-01-07 DIAGNOSIS — E1122 Type 2 diabetes mellitus with diabetic chronic kidney disease: Secondary | ICD-10-CM | POA: Diagnosis not present

## 2021-01-07 DIAGNOSIS — I69318 Other symptoms and signs involving cognitive functions following cerebral infarction: Secondary | ICD-10-CM | POA: Diagnosis not present

## 2021-01-07 DIAGNOSIS — Z992 Dependence on renal dialysis: Secondary | ICD-10-CM | POA: Diagnosis not present

## 2021-01-07 DIAGNOSIS — D631 Anemia in chronic kidney disease: Secondary | ICD-10-CM | POA: Diagnosis not present

## 2021-01-07 DIAGNOSIS — T82848D Pain from vascular prosthetic devices, implants and grafts, subsequent encounter: Secondary | ICD-10-CM | POA: Diagnosis not present

## 2021-01-07 DIAGNOSIS — E8770 Fluid overload, unspecified: Secondary | ICD-10-CM | POA: Diagnosis not present

## 2021-01-07 DIAGNOSIS — M79642 Pain in left hand: Secondary | ICD-10-CM | POA: Diagnosis not present

## 2021-01-07 DIAGNOSIS — N186 End stage renal disease: Secondary | ICD-10-CM | POA: Diagnosis not present

## 2021-01-07 DIAGNOSIS — D509 Iron deficiency anemia, unspecified: Secondary | ICD-10-CM | POA: Diagnosis not present

## 2021-01-07 DIAGNOSIS — I120 Hypertensive chronic kidney disease with stage 5 chronic kidney disease or end stage renal disease: Secondary | ICD-10-CM | POA: Diagnosis not present

## 2021-01-07 DIAGNOSIS — R531 Weakness: Secondary | ICD-10-CM | POA: Diagnosis not present

## 2021-01-08 DIAGNOSIS — D631 Anemia in chronic kidney disease: Secondary | ICD-10-CM | POA: Diagnosis not present

## 2021-01-08 DIAGNOSIS — I120 Hypertensive chronic kidney disease with stage 5 chronic kidney disease or end stage renal disease: Secondary | ICD-10-CM | POA: Diagnosis not present

## 2021-01-08 DIAGNOSIS — R531 Weakness: Secondary | ICD-10-CM | POA: Diagnosis not present

## 2021-01-08 DIAGNOSIS — E1122 Type 2 diabetes mellitus with diabetic chronic kidney disease: Secondary | ICD-10-CM | POA: Diagnosis not present

## 2021-01-08 DIAGNOSIS — I69318 Other symptoms and signs involving cognitive functions following cerebral infarction: Secondary | ICD-10-CM | POA: Diagnosis not present

## 2021-01-08 DIAGNOSIS — M79642 Pain in left hand: Secondary | ICD-10-CM | POA: Diagnosis not present

## 2021-01-08 DIAGNOSIS — E8770 Fluid overload, unspecified: Secondary | ICD-10-CM | POA: Diagnosis not present

## 2021-01-08 DIAGNOSIS — Z992 Dependence on renal dialysis: Secondary | ICD-10-CM | POA: Diagnosis not present

## 2021-01-08 DIAGNOSIS — N186 End stage renal disease: Secondary | ICD-10-CM | POA: Diagnosis not present

## 2021-01-08 DIAGNOSIS — T82848D Pain from vascular prosthetic devices, implants and grafts, subsequent encounter: Secondary | ICD-10-CM | POA: Diagnosis not present

## 2021-01-08 DIAGNOSIS — D509 Iron deficiency anemia, unspecified: Secondary | ICD-10-CM | POA: Diagnosis not present

## 2021-01-11 DIAGNOSIS — E8770 Fluid overload, unspecified: Secondary | ICD-10-CM | POA: Diagnosis not present

## 2021-01-11 DIAGNOSIS — D631 Anemia in chronic kidney disease: Secondary | ICD-10-CM | POA: Diagnosis not present

## 2021-01-11 DIAGNOSIS — Z992 Dependence on renal dialysis: Secondary | ICD-10-CM | POA: Diagnosis not present

## 2021-01-11 DIAGNOSIS — D509 Iron deficiency anemia, unspecified: Secondary | ICD-10-CM | POA: Diagnosis not present

## 2021-01-11 DIAGNOSIS — N186 End stage renal disease: Secondary | ICD-10-CM | POA: Diagnosis not present

## 2021-01-12 DIAGNOSIS — D631 Anemia in chronic kidney disease: Secondary | ICD-10-CM | POA: Diagnosis not present

## 2021-01-12 DIAGNOSIS — E8770 Fluid overload, unspecified: Secondary | ICD-10-CM | POA: Diagnosis not present

## 2021-01-12 DIAGNOSIS — N186 End stage renal disease: Secondary | ICD-10-CM | POA: Diagnosis not present

## 2021-01-12 DIAGNOSIS — D509 Iron deficiency anemia, unspecified: Secondary | ICD-10-CM | POA: Diagnosis not present

## 2021-01-12 DIAGNOSIS — Z992 Dependence on renal dialysis: Secondary | ICD-10-CM | POA: Diagnosis not present

## 2021-01-13 DIAGNOSIS — I69318 Other symptoms and signs involving cognitive functions following cerebral infarction: Secondary | ICD-10-CM | POA: Diagnosis not present

## 2021-01-13 DIAGNOSIS — E1122 Type 2 diabetes mellitus with diabetic chronic kidney disease: Secondary | ICD-10-CM | POA: Diagnosis not present

## 2021-01-13 DIAGNOSIS — T82848D Pain from vascular prosthetic devices, implants and grafts, subsequent encounter: Secondary | ICD-10-CM | POA: Diagnosis not present

## 2021-01-13 DIAGNOSIS — R531 Weakness: Secondary | ICD-10-CM | POA: Diagnosis not present

## 2021-01-13 DIAGNOSIS — I120 Hypertensive chronic kidney disease with stage 5 chronic kidney disease or end stage renal disease: Secondary | ICD-10-CM | POA: Diagnosis not present

## 2021-01-13 DIAGNOSIS — M79642 Pain in left hand: Secondary | ICD-10-CM | POA: Diagnosis not present

## 2021-01-14 DIAGNOSIS — E1129 Type 2 diabetes mellitus with other diabetic kidney complication: Secondary | ICD-10-CM | POA: Diagnosis not present

## 2021-01-14 DIAGNOSIS — E8770 Fluid overload, unspecified: Secondary | ICD-10-CM | POA: Diagnosis not present

## 2021-01-14 DIAGNOSIS — Z992 Dependence on renal dialysis: Secondary | ICD-10-CM | POA: Diagnosis not present

## 2021-01-14 DIAGNOSIS — N186 End stage renal disease: Secondary | ICD-10-CM | POA: Diagnosis not present

## 2021-01-14 DIAGNOSIS — D509 Iron deficiency anemia, unspecified: Secondary | ICD-10-CM | POA: Diagnosis not present

## 2021-01-15 DIAGNOSIS — Z992 Dependence on renal dialysis: Secondary | ICD-10-CM | POA: Diagnosis not present

## 2021-01-15 DIAGNOSIS — Z111 Encounter for screening for respiratory tuberculosis: Secondary | ICD-10-CM | POA: Insufficient documentation

## 2021-01-15 DIAGNOSIS — D509 Iron deficiency anemia, unspecified: Secondary | ICD-10-CM | POA: Diagnosis not present

## 2021-01-15 DIAGNOSIS — R0902 Hypoxemia: Secondary | ICD-10-CM | POA: Diagnosis not present

## 2021-01-15 DIAGNOSIS — N186 End stage renal disease: Secondary | ICD-10-CM | POA: Diagnosis not present

## 2021-01-15 DIAGNOSIS — R531 Weakness: Secondary | ICD-10-CM | POA: Diagnosis not present

## 2021-01-15 DIAGNOSIS — I491 Atrial premature depolarization: Secondary | ICD-10-CM | POA: Diagnosis not present

## 2021-01-15 DIAGNOSIS — E8770 Fluid overload, unspecified: Secondary | ICD-10-CM | POA: Diagnosis not present

## 2021-01-19 DIAGNOSIS — N186 End stage renal disease: Secondary | ICD-10-CM | POA: Diagnosis not present

## 2021-01-19 DIAGNOSIS — E138 Other specified diabetes mellitus with unspecified complications: Secondary | ICD-10-CM | POA: Diagnosis not present

## 2021-01-19 DIAGNOSIS — Z992 Dependence on renal dialysis: Secondary | ICD-10-CM | POA: Diagnosis not present

## 2021-01-19 DIAGNOSIS — D688 Other specified coagulation defects: Secondary | ICD-10-CM | POA: Insufficient documentation

## 2021-01-19 DIAGNOSIS — D509 Iron deficiency anemia, unspecified: Secondary | ICD-10-CM | POA: Diagnosis not present

## 2021-01-19 DIAGNOSIS — N2581 Secondary hyperparathyroidism of renal origin: Secondary | ICD-10-CM | POA: Diagnosis not present

## 2021-01-19 DIAGNOSIS — Z23 Encounter for immunization: Secondary | ICD-10-CM | POA: Diagnosis not present

## 2021-01-19 DIAGNOSIS — D631 Anemia in chronic kidney disease: Secondary | ICD-10-CM | POA: Diagnosis not present

## 2021-01-20 ENCOUNTER — Telehealth: Payer: Self-pay

## 2021-01-20 DIAGNOSIS — I120 Hypertensive chronic kidney disease with stage 5 chronic kidney disease or end stage renal disease: Secondary | ICD-10-CM | POA: Diagnosis not present

## 2021-01-20 DIAGNOSIS — E1122 Type 2 diabetes mellitus with diabetic chronic kidney disease: Secondary | ICD-10-CM | POA: Diagnosis not present

## 2021-01-20 DIAGNOSIS — R531 Weakness: Secondary | ICD-10-CM | POA: Diagnosis not present

## 2021-01-20 DIAGNOSIS — M79642 Pain in left hand: Secondary | ICD-10-CM | POA: Diagnosis not present

## 2021-01-20 DIAGNOSIS — R5381 Other malaise: Secondary | ICD-10-CM

## 2021-01-20 DIAGNOSIS — I69318 Other symptoms and signs involving cognitive functions following cerebral infarction: Secondary | ICD-10-CM | POA: Diagnosis not present

## 2021-01-20 DIAGNOSIS — T82848D Pain from vascular prosthetic devices, implants and grafts, subsequent encounter: Secondary | ICD-10-CM | POA: Diagnosis not present

## 2021-01-20 NOTE — Telephone Encounter (Signed)
David [name] from [facility] called stating that the Pt fell hitting his head on 9/2. He was moving from his wheelchair to the bed and his leg gave out. [name] is now having pain in his head causing his neck and shoulder hurting. There are no bruises or aberrations. David [name] is concerned about his wheelchair. She stated that the one that he has now, he is unable to lock the brakes himself. She wants to know if Dr [name] can write a prescription for a new wheelchair. David [name] can be reached at [phone]. Please Advise.

## 2021-01-21 DIAGNOSIS — D509 Iron deficiency anemia, unspecified: Secondary | ICD-10-CM | POA: Diagnosis not present

## 2021-01-21 DIAGNOSIS — N186 End stage renal disease: Secondary | ICD-10-CM | POA: Diagnosis not present

## 2021-01-21 DIAGNOSIS — E138 Other specified diabetes mellitus with unspecified complications: Secondary | ICD-10-CM | POA: Diagnosis not present

## 2021-01-21 DIAGNOSIS — N2581 Secondary hyperparathyroidism of renal origin: Secondary | ICD-10-CM | POA: Diagnosis not present

## 2021-01-21 DIAGNOSIS — D631 Anemia in chronic kidney disease: Secondary | ICD-10-CM | POA: Diagnosis not present

## 2021-01-21 DIAGNOSIS — Z992 Dependence on renal dialysis: Secondary | ICD-10-CM | POA: Diagnosis not present

## 2021-01-21 NOTE — Telephone Encounter (Signed)
Wheelchair order has been placed.

## 2021-01-22 ENCOUNTER — Telehealth (INDEPENDENT_AMBULATORY_CARE_PROVIDER_SITE_OTHER): Payer: Medicare Other | Admitting: Family Medicine

## 2021-01-22 ENCOUNTER — Encounter: Payer: Self-pay | Admitting: Family Medicine

## 2021-01-22 ENCOUNTER — Telehealth: Payer: Self-pay | Admitting: *Deleted

## 2021-01-22 VITALS — Ht 76.0 in | Wt 251.0 lb

## 2021-01-22 DIAGNOSIS — E1140 Type 2 diabetes mellitus with diabetic neuropathy, unspecified: Secondary | ICD-10-CM | POA: Diagnosis not present

## 2021-01-22 DIAGNOSIS — E1159 Type 2 diabetes mellitus with other circulatory complications: Secondary | ICD-10-CM | POA: Diagnosis not present

## 2021-01-22 DIAGNOSIS — I739 Peripheral vascular disease, unspecified: Secondary | ICD-10-CM | POA: Diagnosis not present

## 2021-01-22 DIAGNOSIS — I152 Hypertension secondary to endocrine disorders: Secondary | ICD-10-CM

## 2021-01-22 DIAGNOSIS — N186 End stage renal disease: Secondary | ICD-10-CM | POA: Diagnosis not present

## 2021-01-22 DIAGNOSIS — R531 Weakness: Secondary | ICD-10-CM | POA: Diagnosis not present

## 2021-01-22 DIAGNOSIS — R5381 Other malaise: Secondary | ICD-10-CM | POA: Diagnosis not present

## 2021-01-22 DIAGNOSIS — Z741 Need for assistance with personal care: Secondary | ICD-10-CM | POA: Diagnosis not present

## 2021-01-22 DIAGNOSIS — I639 Cerebral infarction, unspecified: Secondary | ICD-10-CM | POA: Diagnosis not present

## 2021-01-22 DIAGNOSIS — Z992 Dependence on renal dialysis: Secondary | ICD-10-CM | POA: Diagnosis not present

## 2021-01-22 DIAGNOSIS — I120 Hypertensive chronic kidney disease with stage 5 chronic kidney disease or end stage renal disease: Secondary | ICD-10-CM | POA: Diagnosis not present

## 2021-01-22 DIAGNOSIS — I480 Paroxysmal atrial fibrillation: Secondary | ICD-10-CM | POA: Diagnosis not present

## 2021-01-22 DIAGNOSIS — E1122 Type 2 diabetes mellitus with diabetic chronic kidney disease: Secondary | ICD-10-CM | POA: Diagnosis not present

## 2021-01-22 DIAGNOSIS — R296 Repeated falls: Secondary | ICD-10-CM | POA: Diagnosis not present

## 2021-01-22 DIAGNOSIS — Z794 Long term (current) use of insulin: Secondary | ICD-10-CM | POA: Diagnosis not present

## 2021-01-22 DIAGNOSIS — M79642 Pain in left hand: Secondary | ICD-10-CM | POA: Diagnosis not present

## 2021-01-22 DIAGNOSIS — I2510 Atherosclerotic heart disease of native coronary artery without angina pectoris: Secondary | ICD-10-CM | POA: Diagnosis not present

## 2021-01-22 NOTE — Assessment & Plan Note (Signed)
On Plavix and statin ?

## 2021-01-22 NOTE — Telephone Encounter (Signed)
VO given for PT  1x per week for 4 weeks

## 2021-01-22 NOTE — Telephone Encounter (Signed)
Ok with me. Please place any necessary orders. 

## 2021-01-22 NOTE — Assessment & Plan Note (Signed)
Managed by nephrology.  Home health nurse has been assessing.  We will continue current regimen for now including amlodipine 10 mg daily, Coreg 25 mg twice daily, Imdur 30 mg daily, and hydralazine 100 mg twice daily.

## 2021-01-22 NOTE — Progress Notes (Addendum)
   QUASIM DOYON is a 80 y.o. male who presents today for a virtual office visit.  Assessment/Plan:  Chronic Problems Addressed Today: Hypertension associated with diabetes ([place]) Managed by nephrology.  Home health nurse has been assessing.  We will continue current regimen for now including amlodipine 10 mg daily, Coreg 25 mg twice daily, Imdur 30 mg daily, and hydralazine 100 mg twice daily.  Debility Recently had a fall.  He is working with home health physical therapy.  Will place DME order for wheelchair.  Is having a bit of pain associated with the fall however this is improving with Tylenol.  We will let me know if he continues to have pain and he will come in for an office visit.  Patient suffers from debility which impairs their ability to perform daily activities like bathing, dressing, grooming, and toileting in the home.  A walker will not resolve issue with performing activities of daily living. A wheelchair will allow patient to safely perform daily activities. Patient can safely propel the wheelchair in the home or has a caregiver who can provide assistance. Length of need Lifetime.   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     Subjective:  HPI:  See A/p.         Objective/Observations  Physical Exam: Gen: NAD, resting comfortably Pulm: Normal work of breathing Neuro: Grossly normal, moves all extremities Psych: Normal affect and thought content  Virtual Visit via Video   I connected with [name] on 02/01/21 at 11:00 AM EDT by a video enabled telemedicine application and verified that I am speaking with the correct person using two identifiers. The limitations of evaluation and management by telemedicine and the availability of in person appointments were discussed. The patient expressed understanding and agreed to proceed.   Patient location: Home Provider location: [place] participating in the virtual visit: Myself and Patient and his wife     [name], MD 02/01/2021 1:13 PM

## 2021-01-22 NOTE — Assessment & Plan Note (Addendum)
Recently had a fall.  He is working with home health physical therapy.  Will place DME order for wheelchair.  Is having a bit of pain associated with the fall however this is improving with Tylenol.  We will let me know if he continues to have pain and he will come in for an office visit.  Patient suffers from debility which impairs their ability to perform daily activities like bathing, dressing, grooming, and toileting in the home. A walker will not resolve issue with performing activities of daily living. A wheelchair will allow patient to safely perform daily activities. Patient can safely propel the wheelchair in the home or has a caregiver who can provide assistance. Length of need Lifetime.

## 2021-01-23 DIAGNOSIS — Z992 Dependence on renal dialysis: Secondary | ICD-10-CM | POA: Diagnosis not present

## 2021-01-23 DIAGNOSIS — D631 Anemia in chronic kidney disease: Secondary | ICD-10-CM | POA: Diagnosis not present

## 2021-01-23 DIAGNOSIS — N186 End stage renal disease: Secondary | ICD-10-CM | POA: Diagnosis not present

## 2021-01-23 DIAGNOSIS — N2581 Secondary hyperparathyroidism of renal origin: Secondary | ICD-10-CM | POA: Diagnosis not present

## 2021-01-23 DIAGNOSIS — E138 Other specified diabetes mellitus with unspecified complications: Secondary | ICD-10-CM | POA: Diagnosis not present

## 2021-01-23 DIAGNOSIS — D509 Iron deficiency anemia, unspecified: Secondary | ICD-10-CM | POA: Diagnosis not present

## 2021-01-26 DIAGNOSIS — E138 Other specified diabetes mellitus with unspecified complications: Secondary | ICD-10-CM | POA: Diagnosis not present

## 2021-01-26 DIAGNOSIS — D509 Iron deficiency anemia, unspecified: Secondary | ICD-10-CM | POA: Diagnosis not present

## 2021-01-26 DIAGNOSIS — D631 Anemia in chronic kidney disease: Secondary | ICD-10-CM | POA: Diagnosis not present

## 2021-01-26 DIAGNOSIS — Z992 Dependence on renal dialysis: Secondary | ICD-10-CM | POA: Diagnosis not present

## 2021-01-26 DIAGNOSIS — N186 End stage renal disease: Secondary | ICD-10-CM | POA: Diagnosis not present

## 2021-01-26 DIAGNOSIS — N2581 Secondary hyperparathyroidism of renal origin: Secondary | ICD-10-CM | POA: Diagnosis not present

## 2021-01-27 DIAGNOSIS — M79642 Pain in left hand: Secondary | ICD-10-CM | POA: Diagnosis not present

## 2021-01-27 DIAGNOSIS — I120 Hypertensive chronic kidney disease with stage 5 chronic kidney disease or end stage renal disease: Secondary | ICD-10-CM | POA: Diagnosis not present

## 2021-01-27 DIAGNOSIS — E1140 Type 2 diabetes mellitus with diabetic neuropathy, unspecified: Secondary | ICD-10-CM | POA: Diagnosis not present

## 2021-01-27 DIAGNOSIS — I739 Peripheral vascular disease, unspecified: Secondary | ICD-10-CM | POA: Diagnosis not present

## 2021-01-27 DIAGNOSIS — E1122 Type 2 diabetes mellitus with diabetic chronic kidney disease: Secondary | ICD-10-CM | POA: Diagnosis not present

## 2021-01-27 DIAGNOSIS — R531 Weakness: Secondary | ICD-10-CM | POA: Diagnosis not present

## 2021-01-28 ENCOUNTER — Encounter (HOSPITAL_COMMUNITY): Payer: Medicare Other

## 2021-01-28 ENCOUNTER — Telehealth: Payer: Self-pay | Admitting: *Deleted

## 2021-01-28 DIAGNOSIS — N186 End stage renal disease: Secondary | ICD-10-CM | POA: Diagnosis not present

## 2021-01-28 DIAGNOSIS — N2581 Secondary hyperparathyroidism of renal origin: Secondary | ICD-10-CM | POA: Diagnosis not present

## 2021-01-28 DIAGNOSIS — D631 Anemia in chronic kidney disease: Secondary | ICD-10-CM | POA: Diagnosis not present

## 2021-01-28 DIAGNOSIS — E138 Other specified diabetes mellitus with unspecified complications: Secondary | ICD-10-CM | POA: Diagnosis not present

## 2021-01-28 DIAGNOSIS — D509 Iron deficiency anemia, unspecified: Secondary | ICD-10-CM | POA: Diagnosis not present

## 2021-01-28 DIAGNOSIS — Z992 Dependence on renal dialysis: Secondary | ICD-10-CM | POA: Diagnosis not present

## 2021-01-28 NOTE — Telephone Encounter (Signed)
David [name] [phone] Ext [phone] Call requesting last OV note for WC order  Stated in last OV need to have the statement on order. If is possible can it be added to the last note?  Patient suffers from debility which impairs their ability to perform daily activities like bathing, dressing, grooming, and toileting in the home.  A walker will not resolve issue with performing activities of daily living. A wheelchair will allow patient to safely perform daily activities. Patient can safely propel the wheelchair in the home or has a caregiver who can provide assistance. Length of need Lifetime.  Accessories: elevating leg rests (ELRs), wheel locks, extensions and anti-tippers.  Fax note to [facility]

## 2021-01-29 DIAGNOSIS — R531 Weakness: Secondary | ICD-10-CM | POA: Diagnosis not present

## 2021-01-29 DIAGNOSIS — I120 Hypertensive chronic kidney disease with stage 5 chronic kidney disease or end stage renal disease: Secondary | ICD-10-CM | POA: Diagnosis not present

## 2021-01-29 DIAGNOSIS — I739 Peripheral vascular disease, unspecified: Secondary | ICD-10-CM | POA: Diagnosis not present

## 2021-01-29 DIAGNOSIS — M79642 Pain in left hand: Secondary | ICD-10-CM | POA: Diagnosis not present

## 2021-01-29 DIAGNOSIS — E1140 Type 2 diabetes mellitus with diabetic neuropathy, unspecified: Secondary | ICD-10-CM | POA: Diagnosis not present

## 2021-01-29 DIAGNOSIS — E1122 Type 2 diabetes mellitus with diabetic chronic kidney disease: Secondary | ICD-10-CM | POA: Diagnosis not present

## 2021-01-30 DIAGNOSIS — E138 Other specified diabetes mellitus with unspecified complications: Secondary | ICD-10-CM | POA: Diagnosis not present

## 2021-01-30 DIAGNOSIS — N186 End stage renal disease: Secondary | ICD-10-CM | POA: Diagnosis not present

## 2021-01-30 DIAGNOSIS — Z992 Dependence on renal dialysis: Secondary | ICD-10-CM | POA: Diagnosis not present

## 2021-01-30 DIAGNOSIS — N2581 Secondary hyperparathyroidism of renal origin: Secondary | ICD-10-CM | POA: Diagnosis not present

## 2021-01-30 DIAGNOSIS — D509 Iron deficiency anemia, unspecified: Secondary | ICD-10-CM | POA: Diagnosis not present

## 2021-01-30 DIAGNOSIS — D631 Anemia in chronic kidney disease: Secondary | ICD-10-CM | POA: Diagnosis not present

## 2021-02-01 DIAGNOSIS — R531 Weakness: Secondary | ICD-10-CM | POA: Diagnosis not present

## 2021-02-01 DIAGNOSIS — I120 Hypertensive chronic kidney disease with stage 5 chronic kidney disease or end stage renal disease: Secondary | ICD-10-CM | POA: Diagnosis not present

## 2021-02-01 DIAGNOSIS — E1122 Type 2 diabetes mellitus with diabetic chronic kidney disease: Secondary | ICD-10-CM | POA: Diagnosis not present

## 2021-02-01 DIAGNOSIS — E1140 Type 2 diabetes mellitus with diabetic neuropathy, unspecified: Secondary | ICD-10-CM | POA: Diagnosis not present

## 2021-02-01 DIAGNOSIS — M79642 Pain in left hand: Secondary | ICD-10-CM | POA: Diagnosis not present

## 2021-02-01 DIAGNOSIS — I739 Peripheral vascular disease, unspecified: Secondary | ICD-10-CM | POA: Diagnosis not present

## 2021-02-01 NOTE — Telephone Encounter (Signed)
I have addended the note.  [name], MD 02/01/2021 1:13 PM

## 2021-02-01 NOTE — Telephone Encounter (Signed)
Note printed and faxed to [facility]

## 2021-02-02 DIAGNOSIS — D509 Iron deficiency anemia, unspecified: Secondary | ICD-10-CM | POA: Diagnosis not present

## 2021-02-02 DIAGNOSIS — D631 Anemia in chronic kidney disease: Secondary | ICD-10-CM | POA: Diagnosis not present

## 2021-02-02 DIAGNOSIS — N186 End stage renal disease: Secondary | ICD-10-CM | POA: Diagnosis not present

## 2021-02-02 DIAGNOSIS — E138 Other specified diabetes mellitus with unspecified complications: Secondary | ICD-10-CM | POA: Diagnosis not present

## 2021-02-02 DIAGNOSIS — Z992 Dependence on renal dialysis: Secondary | ICD-10-CM | POA: Diagnosis not present

## 2021-02-02 DIAGNOSIS — N2581 Secondary hyperparathyroidism of renal origin: Secondary | ICD-10-CM | POA: Diagnosis not present

## 2021-02-03 DIAGNOSIS — R531 Weakness: Secondary | ICD-10-CM | POA: Diagnosis not present

## 2021-02-03 DIAGNOSIS — E1122 Type 2 diabetes mellitus with diabetic chronic kidney disease: Secondary | ICD-10-CM | POA: Diagnosis not present

## 2021-02-03 DIAGNOSIS — E1140 Type 2 diabetes mellitus with diabetic neuropathy, unspecified: Secondary | ICD-10-CM | POA: Diagnosis not present

## 2021-02-03 DIAGNOSIS — I739 Peripheral vascular disease, unspecified: Secondary | ICD-10-CM | POA: Diagnosis not present

## 2021-02-03 DIAGNOSIS — I120 Hypertensive chronic kidney disease with stage 5 chronic kidney disease or end stage renal disease: Secondary | ICD-10-CM | POA: Diagnosis not present

## 2021-02-03 DIAGNOSIS — M79642 Pain in left hand: Secondary | ICD-10-CM | POA: Diagnosis not present

## 2021-02-04 DIAGNOSIS — N2581 Secondary hyperparathyroidism of renal origin: Secondary | ICD-10-CM | POA: Diagnosis not present

## 2021-02-04 DIAGNOSIS — D509 Iron deficiency anemia, unspecified: Secondary | ICD-10-CM | POA: Diagnosis not present

## 2021-02-04 DIAGNOSIS — N186 End stage renal disease: Secondary | ICD-10-CM | POA: Diagnosis not present

## 2021-02-04 DIAGNOSIS — E138 Other specified diabetes mellitus with unspecified complications: Secondary | ICD-10-CM | POA: Diagnosis not present

## 2021-02-04 DIAGNOSIS — Z992 Dependence on renal dialysis: Secondary | ICD-10-CM | POA: Diagnosis not present

## 2021-02-04 DIAGNOSIS — D631 Anemia in chronic kidney disease: Secondary | ICD-10-CM | POA: Diagnosis not present

## 2021-02-06 DIAGNOSIS — D509 Iron deficiency anemia, unspecified: Secondary | ICD-10-CM | POA: Diagnosis not present

## 2021-02-06 DIAGNOSIS — N2581 Secondary hyperparathyroidism of renal origin: Secondary | ICD-10-CM | POA: Diagnosis not present

## 2021-02-06 DIAGNOSIS — D631 Anemia in chronic kidney disease: Secondary | ICD-10-CM | POA: Diagnosis not present

## 2021-02-06 DIAGNOSIS — E138 Other specified diabetes mellitus with unspecified complications: Secondary | ICD-10-CM | POA: Diagnosis not present

## 2021-02-06 DIAGNOSIS — Z992 Dependence on renal dialysis: Secondary | ICD-10-CM | POA: Diagnosis not present

## 2021-02-06 DIAGNOSIS — N186 End stage renal disease: Secondary | ICD-10-CM | POA: Diagnosis not present

## 2021-02-08 ENCOUNTER — Encounter: Payer: Self-pay | Admitting: Family Medicine

## 2021-02-08 ENCOUNTER — Other Ambulatory Visit: Payer: Self-pay | Admitting: *Deleted

## 2021-02-08 DIAGNOSIS — I739 Peripheral vascular disease, unspecified: Secondary | ICD-10-CM | POA: Diagnosis not present

## 2021-02-08 DIAGNOSIS — E1122 Type 2 diabetes mellitus with diabetic chronic kidney disease: Secondary | ICD-10-CM | POA: Diagnosis not present

## 2021-02-08 DIAGNOSIS — E1140 Type 2 diabetes mellitus with diabetic neuropathy, unspecified: Secondary | ICD-10-CM | POA: Diagnosis not present

## 2021-02-08 DIAGNOSIS — N184 Chronic kidney disease, stage 4 (severe): Secondary | ICD-10-CM

## 2021-02-08 DIAGNOSIS — M79642 Pain in left hand: Secondary | ICD-10-CM | POA: Diagnosis not present

## 2021-02-08 DIAGNOSIS — R531 Weakness: Secondary | ICD-10-CM | POA: Diagnosis not present

## 2021-02-08 DIAGNOSIS — I120 Hypertensive chronic kidney disease with stage 5 chronic kidney disease or end stage renal disease: Secondary | ICD-10-CM | POA: Diagnosis not present

## 2021-02-09 DIAGNOSIS — N186 End stage renal disease: Secondary | ICD-10-CM | POA: Diagnosis not present

## 2021-02-09 DIAGNOSIS — Z992 Dependence on renal dialysis: Secondary | ICD-10-CM | POA: Diagnosis not present

## 2021-02-09 DIAGNOSIS — D631 Anemia in chronic kidney disease: Secondary | ICD-10-CM | POA: Diagnosis not present

## 2021-02-09 DIAGNOSIS — E138 Other specified diabetes mellitus with unspecified complications: Secondary | ICD-10-CM | POA: Diagnosis not present

## 2021-02-09 DIAGNOSIS — N2581 Secondary hyperparathyroidism of renal origin: Secondary | ICD-10-CM | POA: Diagnosis not present

## 2021-02-09 DIAGNOSIS — D509 Iron deficiency anemia, unspecified: Secondary | ICD-10-CM | POA: Diagnosis not present

## 2021-02-09 NOTE — Telephone Encounter (Signed)
Can you schedule an appointment for Gout Flares  Thanks

## 2021-02-09 NOTE — Telephone Encounter (Signed)
Please advise 

## 2021-02-10 ENCOUNTER — Encounter: Payer: Self-pay | Admitting: Family Medicine

## 2021-02-10 DIAGNOSIS — R531 Weakness: Secondary | ICD-10-CM | POA: Diagnosis not present

## 2021-02-10 DIAGNOSIS — I739 Peripheral vascular disease, unspecified: Secondary | ICD-10-CM | POA: Diagnosis not present

## 2021-02-10 DIAGNOSIS — E1122 Type 2 diabetes mellitus with diabetic chronic kidney disease: Secondary | ICD-10-CM | POA: Diagnosis not present

## 2021-02-10 DIAGNOSIS — E1140 Type 2 diabetes mellitus with diabetic neuropathy, unspecified: Secondary | ICD-10-CM | POA: Diagnosis not present

## 2021-02-10 DIAGNOSIS — M79642 Pain in left hand: Secondary | ICD-10-CM | POA: Diagnosis not present

## 2021-02-10 DIAGNOSIS — I120 Hypertensive chronic kidney disease with stage 5 chronic kidney disease or end stage renal disease: Secondary | ICD-10-CM | POA: Diagnosis not present

## 2021-02-11 ENCOUNTER — Telehealth: Payer: Self-pay

## 2021-02-11 DIAGNOSIS — Z992 Dependence on renal dialysis: Secondary | ICD-10-CM | POA: Diagnosis not present

## 2021-02-11 DIAGNOSIS — D631 Anemia in chronic kidney disease: Secondary | ICD-10-CM | POA: Diagnosis not present

## 2021-02-11 DIAGNOSIS — N2581 Secondary hyperparathyroidism of renal origin: Secondary | ICD-10-CM | POA: Diagnosis not present

## 2021-02-11 DIAGNOSIS — N186 End stage renal disease: Secondary | ICD-10-CM | POA: Diagnosis not present

## 2021-02-11 DIAGNOSIS — E138 Other specified diabetes mellitus with unspecified complications: Secondary | ICD-10-CM | POA: Diagnosis not present

## 2021-02-11 DIAGNOSIS — D509 Iron deficiency anemia, unspecified: Secondary | ICD-10-CM | POA: Diagnosis not present

## 2021-02-11 NOTE — Telephone Encounter (Signed)
See note

## 2021-02-11 NOTE — Telephone Encounter (Signed)
Request was send to Dr [name]

## 2021-02-11 NOTE — Telephone Encounter (Signed)
Marzetta Board from [facility] states [facility] would like authora care to see patient for pallative care. Marzetta Board would like to get a call back to make sure that dr.parker is away and it is ok  Please advise  Thank you [phone]

## 2021-02-11 NOTE — Telephone Encounter (Signed)
Ok to place order 

## 2021-02-11 NOTE — Telephone Encounter (Signed)
Pt has appointment on 02/12/2021

## 2021-02-11 NOTE — Telephone Encounter (Signed)
David [name] with [facility] called wanting orders for physical therapy. David [name]'s family is also requesting a hospital bed. Can Dr David [name] write a prescription for the bed? David [name] can be reached at [phone] and fax number is [phone]. Please Advise.

## 2021-02-12 ENCOUNTER — Other Ambulatory Visit: Payer: Self-pay

## 2021-02-12 ENCOUNTER — Telehealth (INDEPENDENT_AMBULATORY_CARE_PROVIDER_SITE_OTHER): Payer: Medicare Other | Admitting: Physician Assistant

## 2021-02-12 ENCOUNTER — Encounter: Payer: Self-pay | Admitting: Physician Assistant

## 2021-02-12 DIAGNOSIS — M25531 Pain in right wrist: Secondary | ICD-10-CM | POA: Diagnosis not present

## 2021-02-12 MED ORDER — PREDNISONE 20 MG PO TABS: 40.0000 mg | ORAL_TABLET | Freq: Every day | ORAL | 0 refills | Status: DC

## 2021-02-12 NOTE — Telephone Encounter (Signed)
Ok with me. Please place any necessary orders. 

## 2021-02-12 NOTE — Progress Notes (Signed)
Virtual Visit via Video   I connected with David [name] on 02/12/21 at  1:00 PM EDT by a video enabled telemedicine application and verified that I am speaking with the correct person using two identifiers. Location patient: Home Location provider: [facility], Office Persons participating in the virtual visit: David [name], [place] PA-C  I discussed the limitations of evaluation and management by telemedicine and the availability of in person appointments. The patient expressed understanding and agreed to proceed.   Subjective:   HPI:  Right wrist pain About a week ago patient developed right wrist pain.  He has a history of this in his left wrist that was presumed to be gout.  He has tried Tylenol and Biofreeze without any relief of symptoms.  His wife reports that he is having trouble getting in and out of his wheelchair in his bed due to his pain.  He is dialysis dependent.  He is also diabetic with well-controlled blood sugars with insulin use.  Denies fever, chills, significant swelling of extremity.   ROS: See pertinent positives and negatives per HPI.  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[place]) 09/04/2017   Nonobstructive CAD s/p PCI 2012    Cardiac pacemaker in situ    PVD (peripheral vascular disease) ([place])    Anxiety 03/22/2017   Acute cholecystitis s/p lap cholecystectomy 03/05/2017 03/04/2017   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[place]) 03/04/2017   IDDM (insulin dependent diabetes mellitus) - on insulin pump  03/04/2017   Fatigue 01/27/2017   Low vitamin B12 level 01/27/2017   OSA on CPAP 10/26/2014   Seasonal and perennial allergic rhinitis 10/25/2013   Hypertension associated with diabetes ([place]) 10/25/2013   Hyperlipidemia associated with type 2 diabetes mellitus ([place]) 10/25/2013    Social History   Tobacco Use   Smoking status: Former    Packs/day: 2.00    Years: 30.00    Pack years: 60.00    Types: Cigarettes    Quit date: 03/03/1984    Years since quitting: 36.9   Smokeless tobacco: Never  Substance Use Topics   Alcohol use: Yes    Alcohol/week: 1.0 standard drink    Types: 1 Glasses of wine per week    Comment: social 1-2 times/month    Current Outpatient Medications:    acetaminophen (TYLENOL) 650 MG CR tablet, Take 650 mg by mouth every 8 (eight) hours as needed for pain., Disp: , Rfl:    allopurinol (ZYLOPRIM) 100 MG tablet, Take 1 tablet (100 mg total) by mouth daily., Disp: 90 tablet, Rfl: 3   amLODipine (NORVASC) 5 MG tablet, Take 1 tablet (5 mg total) by mouth daily., Disp: 90 tablet, Rfl: 1   atorvastatin (LIPITOR) 10 MG tablet, Take 1 tablet (10 mg total) by mouth daily., Disp: 90 tablet, Rfl: 1   B Complex-C (B-COMPLEX WITH VITAMIN C) tablet, Take 1 tablet by mouth daily., Disp: , Rfl:    B Complex-C-Zn-Folic Acid (DIALYVITE 970 WITH ZINC) 0.8 MG TABS, Take by mouth., Disp: , Rfl:    blood glucose meter kit and  supplies KIT, Dispense based on patient and insurance preference. Use up to four times daily as directed. (FOR ICD-9 250.00, 250.01)., Disp: 1 each, Rfl: 0   calcitRIOL (ROCALTROL) 0.25 MCG capsule, Take 1 capsule (0.25 mcg total) by mouth daily., Disp: 90 capsule, Rfl: 0   carvedilol (COREG) 25 MG tablet, Take 1 tablet (25 mg total) by mouth 2 (two) times daily with a meal., Disp: 180 tablet, Rfl: 0   cetirizine (ZYRTEC) 10 MG tablet, Take 10 mg by mouth daily., Disp: , Rfl:    clopidogrel (PLAVIX) 75 MG tablet, Take 1 tablet (75 mg total) by mouth daily.,  Disp: 90 tablet, Rfl: 3   Epoetin Alfa (EPOGEN IJ), Inject as directed See admin instructions. Every 4 weeks, Disp: , Rfl:    folic acid (FOLVITE) 1 MG tablet, Take 1 mg by mouth daily., Disp: , Rfl:    folic acid-vitamin b complex-vitamin c-selenium-zinc (DIALYVITE) 3 MG TABS tablet, Take 1 tablet by mouth daily., Disp: , Rfl:    glucose blood test strip, Use Contour test strips as instructed to check blood sugar four times daily., Disp: 200 each, Rfl: 2   hydrALAZINE (APRESOLINE) 100 MG tablet, Take 1 tablet (100 mg total) by mouth 2 (two) times daily., Disp: 180 tablet, Rfl: 0   insulin aspart (NOVOLOG FLEXPEN) 100 UNIT/ML FlexPen, Inject 10-12 Units into the skin See admin instructions. Inject 10 units subcutaneously before breakfast and 12 units before supper, Disp: 15 mL, Rfl: 5   insulin degludec (TRESIBA FLEXTOUCH) 200 UNIT/ML FlexTouch Pen, INJECT 44 UNITS SUBCUTANEOUSLY ONCE DAILY, Disp: 9 mL, Rfl: 2   Insulin Syringe-Needle U-100 (INSULIN SYRINGE 1CC/31GX5/16") 31G X 5/16" 1 ML MISC, 1 each by Does not apply route 3 (three) times daily. Use insulin syringe to inject insulin three times daily., Disp: 100 each, Rfl: 2   isosorbide mononitrate (IMDUR) 30 MG 24 hr tablet, TAKE 2 TABLETS DAILY., Disp: 180 tablet, Rfl: 3   liraglutide (VICTOZA) 18 MG/3ML SOPN, Inject 1.8 mg into the skin daily before supper., Disp: 3 mL, Rfl: 1   Omega-3 Fatty Acids (FISH OIL) 1000 MG CAPS, Take 1 capsule (1,000 mg total) by mouth every morning., Disp: 30 capsule, Rfl: 1   pantoprazole (PROTONIX) 40 MG tablet, Take 1 tablet (40 mg total) by mouth daily., Disp: 90 tablet, Rfl: 0   predniSONE (DELTASONE) 20 MG tablet, Take 2 tablets (40 mg total) by mouth daily., Disp: 10 tablet, Rfl: 0   PRESCRIPTION MEDICATION, Place into the left eye See admin instructions. Injections at [facility] once every 4 weeks - left eye for diabetic retinopathy, Disp: , Rfl:    sertraline (ZOLOFT) 100 MG tablet, Take 1 tablet (100  mg total) by mouth daily., Disp: 90 tablet, Rfl: 1   sevelamer carbonate (RENVELA) 800 MG tablet, Take 800 mg by mouth 3 (three) times daily with meals., Disp: , Rfl:    sodium bicarbonate 650 MG tablet, Take 2 tablets (1,300 mg total) by mouth 2 (two) times daily., Disp: , Rfl:    traMADol (ULTRAM) 50 MG tablet, Take 1 tablet (50 mg total) by mouth every 6 (six) hours as needed., Disp: 8 tablet, Rfl: 0   vitamin B-12 (CYANOCOBALAMIN) 100 MCG tablet, Take 100 mcg by mouth daily., Disp: , Rfl:   Allergies  Allergen Reactions   Ativan [Lorazepam] Anxiety and Other (See Comments)    Pt gets more agitated   Adhesive [Tape] Other (See Comments)    blisters    Objective:  VITALS: Per patient if applicable, see vitals. GENERAL: Alert, appears well and in no acute distress. HEENT: Atraumatic, conjunctiva clear, no obvious abnormalities on inspection of external nose and ears. NECK: Normal movements of the head and neck. CARDIOPULMONARY: No increased WOB. Speaking in clear sentences. I:E ratio WNL.  MS: Moves all visible extremities without noticeable abnormality. PSYCH: Pleasant and cooperative, well-groomed. Speech normal rate and rhythm. Affect is appropriate. Insight and judgement are appropriate. Attention is focused, linear, and appropriate.  NEURO: CN grossly intact. Oriented as arrived to appointment on time with no prompting. Moves both UE equally.  SKIN: No obvious lesions, wounds, erythema, or cyanosis noted on face or hands.  Assessment and Plan:   Leven was seen today for gout.  Diagnoses and all orders for this visit:  Right wrist pain  Other orders -     predniSONE (DELTASONE) 20 MG tablet; Take 2 tablets (40 mg total) by mouth daily.  Consistent with his past medical history and prior presentation of gout Limited anti-inflammatory options due to severe kidney disease Will start 40 mg prednisone daily x5 days Discussed with wife that she may need to adjust his insulin  accordingly, she endorses knowledge of being able to manage this Recommend follow-up if symptoms worsen or do not improve  I discussed the assessment and treatment plan with the patient. The patient was provided an opportunity to ask questions and all were answered. The patient agreed with the plan and demonstrated an understanding of the instructions.   The patient was advised to call back or seek an in-person evaluation if the symptoms worsen or if the condition fails to improve as anticipated.  [place], [place] 02/12/2021

## 2021-02-13 DIAGNOSIS — E138 Other specified diabetes mellitus with unspecified complications: Secondary | ICD-10-CM | POA: Diagnosis not present

## 2021-02-13 DIAGNOSIS — Z23 Encounter for immunization: Secondary | ICD-10-CM | POA: Diagnosis not present

## 2021-02-13 DIAGNOSIS — Z992 Dependence on renal dialysis: Secondary | ICD-10-CM | POA: Diagnosis not present

## 2021-02-13 DIAGNOSIS — N2581 Secondary hyperparathyroidism of renal origin: Secondary | ICD-10-CM | POA: Diagnosis not present

## 2021-02-13 DIAGNOSIS — D509 Iron deficiency anemia, unspecified: Secondary | ICD-10-CM | POA: Diagnosis not present

## 2021-02-13 DIAGNOSIS — D631 Anemia in chronic kidney disease: Secondary | ICD-10-CM | POA: Diagnosis not present

## 2021-02-13 DIAGNOSIS — N186 End stage renal disease: Secondary | ICD-10-CM | POA: Diagnosis not present

## 2021-02-13 DIAGNOSIS — N184 Chronic kidney disease, stage 4 (severe): Secondary | ICD-10-CM | POA: Diagnosis not present

## 2021-02-13 DIAGNOSIS — E1129 Type 2 diabetes mellitus with other diabetic kidney complication: Secondary | ICD-10-CM | POA: Diagnosis not present

## 2021-02-15 ENCOUNTER — Ambulatory Visit (HOSPITAL_COMMUNITY)
Admission: RE | Admit: 2021-02-15 | Discharge: 2021-02-15 | Disposition: A | Discharge status: Home / Self Care | Payer: Medicare Other | Source: Ambulatory Visit | Attending: Surgery | Admitting: Surgery

## 2021-02-15 ENCOUNTER — Other Ambulatory Visit: Payer: Self-pay

## 2021-02-15 ENCOUNTER — Encounter: Payer: Self-pay | Admitting: Physician Assistant

## 2021-02-15 ENCOUNTER — Ambulatory Visit (INDEPENDENT_AMBULATORY_CARE_PROVIDER_SITE_OTHER): Payer: Medicare Other | Admitting: Physician Assistant

## 2021-02-15 VITALS — BP 84/57 | HR 114 | Temp 97.2°F | Resp 18 | Ht 76.0 in | Wt 273.0 lb

## 2021-02-15 DIAGNOSIS — N184 Chronic kidney disease, stage 4 (severe): Secondary | ICD-10-CM | POA: Insufficient documentation

## 2021-02-15 DIAGNOSIS — I639 Cerebral infarction, unspecified: Secondary | ICD-10-CM

## 2021-02-15 DIAGNOSIS — N186 End stage renal disease: Secondary | ICD-10-CM | POA: Diagnosis not present

## 2021-02-15 DIAGNOSIS — Z992 Dependence on renal dialysis: Secondary | ICD-10-CM

## 2021-02-15 NOTE — H&P (View-Only) (Signed)
Established Dialysis Access   History of Present Illness   David [name] is a 80 y.o. (01-15-1941) male who presents for follow up of left brachiocephalic AV fistula. This was initially created on 04/16/20 by Dr. Stanford Breed. He then underwent side branch ligation earlier this year on 08/06/20. He does complain of numbness in the tips of his left thumb, 2nd and 3rd fingers. He says this has been present since the fistula was created. Initially it was worse and now it is just sort of aggravating. He otherwise denies any swelling or pain. He and his wife explain that the dialysis center has attempted cannulation of the left AVF several times but due to tortuosity of the vein they continue to infiltrate the fistula. They therefore have been continuing to use his right IJ [facility] for dialysis. This continues to work well.   The patient's PMH, PSH, SH, and FamHx were reviewed today and are unchanged from his previous visit on 09/08/20.  Current Outpatient Medications  Medication Sig Dispense Refill   acetaminophen (TYLENOL) 650 MG CR tablet Take 650 mg by mouth every 8 (eight) hours as needed for pain.     allopurinol (ZYLOPRIM) 100 MG tablet Take 1 tablet (100 mg total) by mouth daily. 90 tablet 3   amLODipine (NORVASC) 5 MG tablet Take 1 tablet (5 mg total) by mouth daily. 90 tablet 1   atorvastatin (LIPITOR) 10 MG tablet Take 1 tablet (10 mg total) by mouth daily. 90 tablet 1   B Complex-C (B-COMPLEX WITH VITAMIN C) tablet Take 1 tablet by mouth daily.     B Complex-C-Zn-Folic Acid (DIALYVITE 403 WITH ZINC) 0.8 MG TABS Take by mouth.     blood glucose meter kit and supplies KIT Dispense based on patient and insurance preference. Use up to four times daily as directed. (FOR ICD-9 250.00, 250.01). 1 each 0   calcitRIOL (ROCALTROL) 0.25 MCG capsule Take 1 capsule (0.25 mcg total) by mouth daily. 90 capsule 0   carvedilol (COREG) 25 MG tablet Take 1 tablet (25 mg total) by mouth 2 (two) times daily with a  meal. 180 tablet 0   cetirizine (ZYRTEC) 10 MG tablet Take 10 mg by mouth daily.     clopidogrel (PLAVIX) 75 MG tablet Take 1 tablet (75 mg total) by mouth daily. 90 tablet 3   Epoetin Alfa (EPOGEN IJ) Inject as directed See admin instructions. Every 4 weeks     folic acid (FOLVITE) 1 MG tablet Take 1 mg by mouth daily.     folic acid-vitamin b complex-vitamin c-selenium-zinc (DIALYVITE) 3 MG TABS tablet Take 1 tablet by mouth daily.     glucose blood test strip Use Contour test strips as instructed to check blood sugar four times daily. 200 each 2   hydrALAZINE (APRESOLINE) 100 MG tablet Take 1 tablet (100 mg total) by mouth 2 (two) times daily. 180 tablet 0   insulin aspart (NOVOLOG FLEXPEN) 100 UNIT/ML FlexPen Inject 10-12 Units into the skin See admin instructions. Inject 10 units subcutaneously before breakfast and 12 units before supper 15 mL 5   insulin degludec (TRESIBA FLEXTOUCH) 200 UNIT/ML FlexTouch Pen INJECT 44 UNITS SUBCUTANEOUSLY ONCE DAILY 9 mL 2   Insulin Syringe-Needle U-100 (INSULIN SYRINGE 1CC/31GX5/16") 31G X 5/16" 1 ML MISC 1 each by Does not apply route 3 (three) times daily. Use insulin syringe to inject insulin three times daily. 100 each 2   isosorbide mononitrate (IMDUR) 30 MG 24 hr tablet TAKE 2 TABLETS DAILY.  180 tablet 3   liraglutide (VICTOZA) 18 MG/3ML SOPN Inject 1.8 mg into the skin daily before supper. 3 mL 1   Omega-3 Fatty Acids (FISH OIL) 1000 MG CAPS Take 1 capsule (1,000 mg total) by mouth every morning. 30 capsule 1   pantoprazole (PROTONIX) 40 MG tablet Take 1 tablet (40 mg total) by mouth daily. 90 tablet 0   predniSONE (DELTASONE) 20 MG tablet Take 2 tablets (40 mg total) by mouth daily. 10 tablet 0   PRESCRIPTION MEDICATION Place into the left eye See admin instructions. Injections at [facility] once every 4 weeks - left eye for diabetic retinopathy     sertraline (ZOLOFT) 100 MG tablet Take 1 tablet (100 mg total) by mouth daily. 90 tablet 1    sevelamer carbonate (RENVELA) 800 MG tablet Take 800 mg by mouth 3 (three) times daily with meals.     sodium bicarbonate 650 MG tablet Take 2 tablets (1,300 mg total) by mouth 2 (two) times daily.     traMADol (ULTRAM) 50 MG tablet Take 1 tablet (50 mg total) by mouth every 6 (six) hours as needed. 8 tablet 0   vitamin B-12 (CYANOCOBALAMIN) 100 MCG tablet Take 100 mcg by mouth daily.     No current facility-administered medications for this visit.    On ROS today: Negative unless stated in HPI   Physical Examination   Vitals:   02/15/21 0957  BP: (!) 84/57  Pulse: (!) 114  Resp: 18  Temp: (!) 97.2 F (36.2 C)  TempSrc: Temporal  Weight: 273 lb (123.8 kg)  Height: _0  (1.93 m)   Body mass index is 33.23 kg/m.  General Well nourished, well appearing, not in any distress left arm Incisions are well healed, 1+ radial pulse, hand grip is 5/5, sensation in digits is intact, palpable thrill, bruit can  be auscultated. Fistula is very tortuous in left upper extremity. There is ecchymosis present overlying fistula due to infiltration. No swelling or edema present    Non-invasive Vascular Imaging  02/15/21 Findings:  +--------------------+----------+-----------------+--------+  AVF                 PSV (cm/s)Flow Vol (mL/min)Comments  +--------------------+----------+-----------------+--------+  Native artery inflow   317          1028                 +--------------------+----------+-----------------+--------+  AVF Anastomosis        451                               +--------------------+----------+-----------------+--------+   +------------+----------+-------------+-----------+--------+  OUTFLOW VEINPSV (cm/s)Diameter (cm)Depth (cm) Describe  +------------+----------+-------------+-----------+--------+  Prox UA        334        0.62        0.27              +------------+----------+-------------+-----------+--------+  Mid UA         147         0.94        0.38              +------------+----------+-------------+-----------+--------+  Dist UA         97        0.95        0.41              +------------+----------+-------------+-----------+--------+  AC Fossa    109 / 204  0.78 / 0.41 0.16 / 0.20          +------------+----------+-------------+-----------+--------+   Summary:  Patent tortuous left Brachio-cephalic fistula throught out the upper extremity with no evidence for restenosis.      Medical Decision Making   VETO MACQUEEN is a 80 y.o. male who presents for follow up of left brachiocephalic AV Fistula. He has some tolerable steal symptoms with numbness in his left fingers. His duplex today shows patent fistula that is tortuous. On exam has a great thrill but it is tortuous. Recommend revision of the fistula to attempt to salvage it and straighten it out.  Patient is on Plavix which will need to be held prior to surgery. Will arrange revision of his Left AVF with Dr. Stanford Breed in the near future.   Karoline Caldwell, PA-C [facility] Office: [phone]  Clinic MD: Dr. Trula Slade

## 2021-02-15 NOTE — Telephone Encounter (Signed)
Hi Stella, can you write this order for the hospital bed and fax it to the number below on one of Dr. Ellwood Handler Rx pads please?

## 2021-02-15 NOTE — Telephone Encounter (Signed)
Order placed

## 2021-02-15 NOTE — Progress Notes (Signed)
Established Dialysis Access   History of Present Illness   David [name] is a 80 y.o. (01-15-1941) male who presents for follow up of left brachiocephalic AV fistula. This was initially created on 04/16/20 by Dr. Stanford Breed. He then underwent side branch ligation earlier this year on 08/06/20. He does complain of numbness in the tips of his left thumb, 2nd and 3rd fingers. He says this has been present since the fistula was created. Initially it was worse and now it is just sort of aggravating. He otherwise denies any swelling or pain. He and his wife explain that the dialysis center has attempted cannulation of the left AVF several times but due to tortuosity of the vein they continue to infiltrate the fistula. They therefore have been continuing to use his right IJ [facility] for dialysis. This continues to work well.   The patient's PMH, PSH, SH, and FamHx were reviewed today and are unchanged from his previous visit on 09/08/20.  Current Outpatient Medications  Medication Sig Dispense Refill   acetaminophen (TYLENOL) 650 MG CR tablet Take 650 mg by mouth every 8 (eight) hours as needed for pain.     allopurinol (ZYLOPRIM) 100 MG tablet Take 1 tablet (100 mg total) by mouth daily. 90 tablet 3   amLODipine (NORVASC) 5 MG tablet Take 1 tablet (5 mg total) by mouth daily. 90 tablet 1   atorvastatin (LIPITOR) 10 MG tablet Take 1 tablet (10 mg total) by mouth daily. 90 tablet 1   B Complex-C (B-COMPLEX WITH VITAMIN C) tablet Take 1 tablet by mouth daily.     B Complex-C-Zn-Folic Acid (DIALYVITE 403 WITH ZINC) 0.8 MG TABS Take by mouth.     blood glucose meter kit and supplies KIT Dispense based on patient and insurance preference. Use up to four times daily as directed. (FOR ICD-9 250.00, 250.01). 1 each 0   calcitRIOL (ROCALTROL) 0.25 MCG capsule Take 1 capsule (0.25 mcg total) by mouth daily. 90 capsule 0   carvedilol (COREG) 25 MG tablet Take 1 tablet (25 mg total) by mouth 2 (two) times daily with a  meal. 180 tablet 0   cetirizine (ZYRTEC) 10 MG tablet Take 10 mg by mouth daily.     clopidogrel (PLAVIX) 75 MG tablet Take 1 tablet (75 mg total) by mouth daily. 90 tablet 3   Epoetin Alfa (EPOGEN IJ) Inject as directed See admin instructions. Every 4 weeks     folic acid (FOLVITE) 1 MG tablet Take 1 mg by mouth daily.     folic acid-vitamin b complex-vitamin c-selenium-zinc (DIALYVITE) 3 MG TABS tablet Take 1 tablet by mouth daily.     glucose blood test strip Use Contour test strips as instructed to check blood sugar four times daily. 200 each 2   hydrALAZINE (APRESOLINE) 100 MG tablet Take 1 tablet (100 mg total) by mouth 2 (two) times daily. 180 tablet 0   insulin aspart (NOVOLOG FLEXPEN) 100 UNIT/ML FlexPen Inject 10-12 Units into the skin See admin instructions. Inject 10 units subcutaneously before breakfast and 12 units before supper 15 mL 5   insulin degludec (TRESIBA FLEXTOUCH) 200 UNIT/ML FlexTouch Pen INJECT 44 UNITS SUBCUTANEOUSLY ONCE DAILY 9 mL 2   Insulin Syringe-Needle U-100 (INSULIN SYRINGE 1CC/31GX5/16") 31G X 5/16" 1 ML MISC 1 each by Does not apply route 3 (three) times daily. Use insulin syringe to inject insulin three times daily. 100 each 2   isosorbide mononitrate (IMDUR) 30 MG 24 hr tablet TAKE 2 TABLETS DAILY.  180 tablet 3   liraglutide (VICTOZA) 18 MG/3ML SOPN Inject 1.8 mg into the skin daily before supper. 3 mL 1   Omega-3 Fatty Acids (FISH OIL) 1000 MG CAPS Take 1 capsule (1,000 mg total) by mouth every morning. 30 capsule 1   pantoprazole (PROTONIX) 40 MG tablet Take 1 tablet (40 mg total) by mouth daily. 90 tablet 0   predniSONE (DELTASONE) 20 MG tablet Take 2 tablets (40 mg total) by mouth daily. 10 tablet 0   PRESCRIPTION MEDICATION Place into the left eye See admin instructions. Injections at [facility] once every 4 weeks - left eye for diabetic retinopathy     sertraline (ZOLOFT) 100 MG tablet Take 1 tablet (100 mg total) by mouth daily. 90 tablet 1    sevelamer carbonate (RENVELA) 800 MG tablet Take 800 mg by mouth 3 (three) times daily with meals.     sodium bicarbonate 650 MG tablet Take 2 tablets (1,300 mg total) by mouth 2 (two) times daily.     traMADol (ULTRAM) 50 MG tablet Take 1 tablet (50 mg total) by mouth every 6 (six) hours as needed. 8 tablet 0   vitamin B-12 (CYANOCOBALAMIN) 100 MCG tablet Take 100 mcg by mouth daily.     No current facility-administered medications for this visit.    On ROS today: Negative unless stated in HPI   Physical Examination   Vitals:   02/15/21 0957  BP: (!) 84/57  Pulse: (!) 114  Resp: 18  Temp: (!) 97.2 F (36.2 C)  TempSrc: Temporal  Weight: 273 lb (123.8 kg)  Height: 6' 4" (1.93 m)   Body mass index is 33.23 kg/m.  General Well nourished, well appearing, not in any distress left arm Incisions are well healed, 1+ radial pulse, hand grip is 5/5, sensation in digits is intact, palpable thrill, bruit can  be auscultated. Fistula is very tortuous in left upper extremity. There is ecchymosis present overlying fistula due to infiltration. No swelling or edema present    Non-invasive Vascular Imaging  02/15/21 Findings:  +--------------------+----------+-----------------+--------+  AVF                 PSV (cm/s)Flow Vol (mL/min)Comments  +--------------------+----------+-----------------+--------+  Native artery inflow   317          1028                 +--------------------+----------+-----------------+--------+  AVF Anastomosis        451                               +--------------------+----------+-----------------+--------+   +------------+----------+-------------+-----------+--------+  OUTFLOW VEINPSV (cm/s)Diameter (cm)Depth (cm) Describe  +------------+----------+-------------+-----------+--------+  Prox UA        334        0.62        0.27              +------------+----------+-------------+-----------+--------+  Mid UA         147         0.94        0.38              +------------+----------+-------------+-----------+--------+  Dist UA         97        0.95        0.41              +------------+----------+-------------+-----------+--------+  AC Fossa    109 / 204    0.78 / 0.41 0.16 / 0.20          +------------+----------+-------------+-----------+--------+   Summary:  Patent tortuous left Brachio-cephalic fistula throught out the upper extremity with no evidence for restenosis.      Medical Decision Making   David [name] is a 80 y.o. male who presents for follow up of left brachiocephalic AV Fistula. He has some tolerable steal symptoms with numbness in his left fingers. His duplex today shows patent fistula that is tortuous. On exam has a great thrill but it is tortuous. Recommend revision of the fistula to attempt to salvage it and straighten it out.  Patient is on Plavix which will need to be held prior to surgery. Will arrange revision of his Left AVF with Dr. Stanford Breed in the near future.   Karoline Caldwell, PA-C [facility] Office: [phone]  Clinic MD: Dr. Trula Slade

## 2021-02-15 NOTE — Telephone Encounter (Signed)
VO for Nurse visit given, 1x per week for 3 weeks  Bed order faxed on 02/10/2021

## 2021-02-16 ENCOUNTER — Telehealth: Payer: Self-pay

## 2021-02-16 ENCOUNTER — Other Ambulatory Visit: Payer: Medicare Other

## 2021-02-16 DIAGNOSIS — N186 End stage renal disease: Secondary | ICD-10-CM | POA: Diagnosis not present

## 2021-02-16 DIAGNOSIS — N2581 Secondary hyperparathyroidism of renal origin: Secondary | ICD-10-CM | POA: Diagnosis not present

## 2021-02-16 DIAGNOSIS — D509 Iron deficiency anemia, unspecified: Secondary | ICD-10-CM | POA: Diagnosis not present

## 2021-02-16 DIAGNOSIS — E138 Other specified diabetes mellitus with unspecified complications: Secondary | ICD-10-CM | POA: Diagnosis not present

## 2021-02-16 DIAGNOSIS — Z23 Encounter for immunization: Secondary | ICD-10-CM | POA: Diagnosis not present

## 2021-02-16 DIAGNOSIS — Z992 Dependence on renal dialysis: Secondary | ICD-10-CM | POA: Diagnosis not present

## 2021-02-16 NOTE — Telephone Encounter (Signed)
(  11:30 am) SW scheduled initial visit with patient. RN/SW visit scheduled a visit for 02/22/21 @ 12 pm.

## 2021-02-17 DIAGNOSIS — R531 Weakness: Secondary | ICD-10-CM | POA: Diagnosis not present

## 2021-02-17 DIAGNOSIS — I739 Peripheral vascular disease, unspecified: Secondary | ICD-10-CM | POA: Diagnosis not present

## 2021-02-17 DIAGNOSIS — E1140 Type 2 diabetes mellitus with diabetic neuropathy, unspecified: Secondary | ICD-10-CM | POA: Diagnosis not present

## 2021-02-17 DIAGNOSIS — M79642 Pain in left hand: Secondary | ICD-10-CM | POA: Diagnosis not present

## 2021-02-17 DIAGNOSIS — I120 Hypertensive chronic kidney disease with stage 5 chronic kidney disease or end stage renal disease: Secondary | ICD-10-CM | POA: Diagnosis not present

## 2021-02-17 DIAGNOSIS — E1122 Type 2 diabetes mellitus with diabetic chronic kidney disease: Secondary | ICD-10-CM | POA: Diagnosis not present

## 2021-02-18 ENCOUNTER — Ambulatory Visit: Payer: Medicare Other | Admitting: Endocrinology

## 2021-02-18 DIAGNOSIS — N2581 Secondary hyperparathyroidism of renal origin: Secondary | ICD-10-CM | POA: Diagnosis not present

## 2021-02-18 DIAGNOSIS — D509 Iron deficiency anemia, unspecified: Secondary | ICD-10-CM | POA: Diagnosis not present

## 2021-02-18 DIAGNOSIS — Z992 Dependence on renal dialysis: Secondary | ICD-10-CM | POA: Diagnosis not present

## 2021-02-18 DIAGNOSIS — Z23 Encounter for immunization: Secondary | ICD-10-CM | POA: Diagnosis not present

## 2021-02-18 DIAGNOSIS — E138 Other specified diabetes mellitus with unspecified complications: Secondary | ICD-10-CM | POA: Diagnosis not present

## 2021-02-18 DIAGNOSIS — N186 End stage renal disease: Secondary | ICD-10-CM | POA: Diagnosis not present

## 2021-02-20 DIAGNOSIS — D509 Iron deficiency anemia, unspecified: Secondary | ICD-10-CM | POA: Diagnosis not present

## 2021-02-20 DIAGNOSIS — N186 End stage renal disease: Secondary | ICD-10-CM | POA: Diagnosis not present

## 2021-02-20 DIAGNOSIS — Z992 Dependence on renal dialysis: Secondary | ICD-10-CM | POA: Diagnosis not present

## 2021-02-20 DIAGNOSIS — Z23 Encounter for immunization: Secondary | ICD-10-CM | POA: Diagnosis not present

## 2021-02-20 DIAGNOSIS — E138 Other specified diabetes mellitus with unspecified complications: Secondary | ICD-10-CM | POA: Diagnosis not present

## 2021-02-20 DIAGNOSIS — N2581 Secondary hyperparathyroidism of renal origin: Secondary | ICD-10-CM | POA: Diagnosis not present

## 2021-02-21 ENCOUNTER — Encounter (HOSPITAL_COMMUNITY): Payer: Self-pay

## 2021-02-21 DIAGNOSIS — N186 End stage renal disease: Secondary | ICD-10-CM | POA: Diagnosis not present

## 2021-02-21 DIAGNOSIS — I480 Paroxysmal atrial fibrillation: Secondary | ICD-10-CM | POA: Diagnosis not present

## 2021-02-21 DIAGNOSIS — E1140 Type 2 diabetes mellitus with diabetic neuropathy, unspecified: Secondary | ICD-10-CM | POA: Diagnosis not present

## 2021-02-21 DIAGNOSIS — R531 Weakness: Secondary | ICD-10-CM | POA: Diagnosis not present

## 2021-02-21 DIAGNOSIS — R296 Repeated falls: Secondary | ICD-10-CM | POA: Diagnosis not present

## 2021-02-21 DIAGNOSIS — I2510 Atherosclerotic heart disease of native coronary artery without angina pectoris: Secondary | ICD-10-CM | POA: Diagnosis not present

## 2021-02-21 DIAGNOSIS — Z741 Need for assistance with personal care: Secondary | ICD-10-CM | POA: Diagnosis not present

## 2021-02-21 DIAGNOSIS — M79642 Pain in left hand: Secondary | ICD-10-CM | POA: Diagnosis not present

## 2021-02-21 DIAGNOSIS — Z794 Long term (current) use of insulin: Secondary | ICD-10-CM | POA: Diagnosis not present

## 2021-02-21 DIAGNOSIS — I739 Peripheral vascular disease, unspecified: Secondary | ICD-10-CM | POA: Diagnosis not present

## 2021-02-21 DIAGNOSIS — I120 Hypertensive chronic kidney disease with stage 5 chronic kidney disease or end stage renal disease: Secondary | ICD-10-CM | POA: Diagnosis not present

## 2021-02-21 DIAGNOSIS — Z992 Dependence on renal dialysis: Secondary | ICD-10-CM | POA: Diagnosis not present

## 2021-02-21 DIAGNOSIS — E1122 Type 2 diabetes mellitus with diabetic chronic kidney disease: Secondary | ICD-10-CM | POA: Diagnosis not present

## 2021-02-22 ENCOUNTER — Other Ambulatory Visit: Payer: Medicare Other | Admitting: *Deleted

## 2021-02-22 ENCOUNTER — Other Ambulatory Visit: Payer: Medicare Other

## 2021-02-22 ENCOUNTER — Other Ambulatory Visit: Payer: Self-pay | Admitting: *Deleted

## 2021-02-22 ENCOUNTER — Other Ambulatory Visit: Payer: Self-pay | Admitting: Family Medicine

## 2021-02-22 ENCOUNTER — Other Ambulatory Visit: Payer: Self-pay

## 2021-02-22 VITALS — BP 127/71 | HR 97 | Temp 97.7°F | Resp 18

## 2021-02-22 DIAGNOSIS — I152 Hypertension secondary to endocrine disorders: Secondary | ICD-10-CM

## 2021-02-22 DIAGNOSIS — Z515 Encounter for palliative care: Secondary | ICD-10-CM

## 2021-02-22 DIAGNOSIS — Z8673 Personal history of transient ischemic attack (TIA), and cerebral infarction without residual deficits: Secondary | ICD-10-CM

## 2021-02-22 NOTE — Progress Notes (Signed)
[facility] RN NOTE  PATIENT NAME: David [name] DOB: 03-18-1941 MRN: [id number]  PRIMARY CARE PROVIDER: [name], MD  RESPONSIBLE PARTY: [name] (wife) Acct ID - Guarantor Home Phone Work Phone Relationship Acct Type  [id number] DARYL, [phone]  Self P/F     [address], [place], [place] [address]   Covid-19 Pre-screening Negative  PLAN OF CARE and INTERVENTION:  ADVANCE CARE PLANNING/GOALS OF CARE: Goal is for patient to remain at home with his wife and continue dialysis for now. He has a DNR. PATIENT/CAREGIVER EDUCATION: Explained palliative care services, safe mobility/transfers, pain management, fall prevention DISEASE STATUS: Joint visit made with LCSW, M. Lonon. Met with patient, wife and daughter-in-law in the home. Upon arrival, patient is lying in bed asleep. He is easily aroused with verbal and tactile stimulation. Wife states he told her this morning that for the past 2-3 days he has been having pain in his lower back. With further assessment he says the pain runs across his entire lower back, but when palpating around his kidneys on both sides he says those areas area very tender. He took some Tylenol about 2am the morning. Wife gave him another dose of Tylenol 1000 mg during visit. They have also been using a heating pad. Grimacing noticed when he pulled himself up to a sitting position using a hand rail on his bed and with transferring to his wheelchair. He says it hurts all the time but Tylenol takes the edge off some. He also has Tramadol available as needed. Wife hesitates to give him this medication because in the past she gave 1/2 tablet and he slept for 2 days. He goes to dialysis on Tuesday, Thursday and Saturdays. He did not have any complaints after his treatment on Saturday. He does say that afterwards he feels very tired and never has any energy. He has been on dialysis for about 3 months. He is anuric. He has a  central venous catheter that is being used. He has had 2 surgeries to place a fistula without success. He has another surgery scheduled for 02/24/21 oer wife to try and straighten out his vein. When speaking with patient regarding whether or not he wants to continue, he replied "Let's talk about something else." He is able to transfer independently from his bed to his wheelchair and propel himself within the home. He is also able to shower, dress and toilet himself. His wife was recently able to get patient into the [place] system at [facility] and has paperwork to complete to have access to other resources such as home health aide when he needs more assistance. He does not have much of an appetite. He eats about 2 meals/day but says he is not hungry most of the time. Wife says he has lost about 40 lbs in the past 3 months with a current weight of 240 lbs. He also c/o neck stiffness and pain. He feels this is a result of a fall he had about 2 months ago hitting his head on the base of his chest of drawers. Recommended using heating pad, continue Tylenol as needed and could apply Voltaren gel (wife has available in the home). He does plan on going to dialysis tomorrow. They are agreeable to future visits with palliative care. Consent signed.   HISTORY OF PRESENT ILLNESS: This is a 80 yo male with a diagnosis of CKD Stage 4. He has a history of frontal lobe CVA, PVD, hypertension, obstructive hypertrophic cardiomyopathy,  hyperlipidemia, IDDM, and osteoarthritis. Palliative care team has been asked to follow patient to assist with symptom management, goals of care and complex decision making.   CODE STATUS: DNR  ADVANCED DIRECTIVES: Y MOST FORM: no PPS: 50%   PHYSICAL EXAM:   VITALS: Today's Vitals   02/22/21 1232  BP: 127/71  Pulse: 97  Resp: 18  Temp: 97.7 F (36.5 C)  TempSrc: Temporal  SpO2: 95%  PainSc: 4     LUNGS: clear to auscultation  CARDIAC: Cor RRR EXTREMITIES: No edema SKIN:  Dry  skin; scattered bruising but no open areas   NEURO:  Alert and oriented x 3, hard of hearing, increased generalized weakness, able to transfer independently and transfer to wheelchair   (Duration of visit and documentation 60 minutes)    Daryl Eastern, RN BSN

## 2021-02-22 NOTE — Telephone Encounter (Signed)
Patients daughter called asking for an update on the orders for HOSP BED.  Please Return Call Regarding this

## 2021-02-22 NOTE — Progress Notes (Signed)
COMMUNITY PALLIATIVE CARE SW NOTE  PATIENT NAME: David [name] DOB: 17-Aug-1940 MRN: [id number]  PRIMARY CARE PROVIDER: [name], MD  RESPONSIBLE PARTY:  Acct ID - Guarantor Home Phone Work Phone Relationship Acct Type  [id number] EXAVIOR, [name]  Self P/F     [place], [place], [place] [address]     PLAN OF CARE and INTERVENTIONS:             GOALS OF CARE/ ADVANCE CARE PLANNING:  Goal is for patient to remain at home. Patient's code status to be further assessed. SOCIAL/EMOTIONAL/SPIRITUAL ASSESSMENT/ INTERVENTIONS:  SW and RN-David [name] completed an initial visit with patient at his home. He was present in his wife-David [name] and daughter-in-law-David [name]. Patient was in bed, laying flat. Patient reported that he was having pain in her lower back, neck and shoulders. Patient attend dialysis  [name])Tuesday, Thursday and Saturday. He does that Tylenol, which he reports only take the edge off. Patient is scheduled to go to a procedure to straighten out his vein on Wednesday. Patient was able to get himself up from his bed and transfer to his wheelchair, where he engaged with the team. Patient is eating at least two meals a day, but has to be reminded and encouraged to eat. Patient has had weight loss, loosing at least 40 lbs in the last three months. Patient's blood sugars are checked twice a day. Patient independent for all ADL's. He is to receive a hospital bed through [facility]. Patient's wife also is in the process of applying for aide care through the [place]. SOCIAL HISTORY: Patient has been married to his wife-David [name] for 60 years. They have three children. Patient served four years in the [place] during [place]. He worked as a Pharmacologist. SW provided supportive presence, active listening, assessment of needs and comfort of patient, and coping of PCG, education, reassurance of support and contact information on how to access support.   PATIENT/CAREGIVER EDUCATION/ COPING:  Patient appears to be coping well. He and his wife have strong family support.  PERSONAL EMERGENCY PLAN:  911 can be activated for emergencies.  COMMUNITY RESOURCES COORDINATION/ HEALTH CARE NAVIGATION:  Patent attends dialysis 3 x/week.  FINANCIAL/LEGAL CONCERNS/INTERVENTIONS:  None.     SOCIAL HX:  Social History   Tobacco Use   Smoking status: Former    Packs/day: 2.00    Years: 30.00    Pack years: 60.00    Types: Cigarettes    Quit date: 03/03/1984    Years since quitting: 37.0   Smokeless tobacco: Never  Substance Use Topics   Alcohol use: Yes    Alcohol/week: 1.0 standard drink    Types: 1 Glasses of wine per week    Comment: social 1-2 times/month    CODE STATUS: DNR ADVANCED DIRECTIVES: No MOST FORM COMPLETE: No HOSPICE EDUCATION PROVIDED: No  PPS: Patient is alert and oriented x3. He is independent for personal care needs.    Duration of visit and documentation: 60 minutes.  [address] [place], [place]

## 2021-02-22 NOTE — Telephone Encounter (Signed)
Patient Daughter aware Rx send to Fax [phone] today

## 2021-02-23 ENCOUNTER — Encounter (HOSPITAL_COMMUNITY): Payer: Self-pay | Admitting: Vascular Surgery

## 2021-02-23 ENCOUNTER — Encounter: Payer: Self-pay | Admitting: Internal Medicine

## 2021-02-23 ENCOUNTER — Telehealth: Payer: Self-pay

## 2021-02-23 ENCOUNTER — Other Ambulatory Visit: Payer: Self-pay | Admitting: *Deleted

## 2021-02-23 ENCOUNTER — Other Ambulatory Visit: Payer: Self-pay

## 2021-02-23 DIAGNOSIS — Z992 Dependence on renal dialysis: Secondary | ICD-10-CM | POA: Diagnosis not present

## 2021-02-23 DIAGNOSIS — N2581 Secondary hyperparathyroidism of renal origin: Secondary | ICD-10-CM | POA: Diagnosis not present

## 2021-02-23 DIAGNOSIS — Z23 Encounter for immunization: Secondary | ICD-10-CM | POA: Diagnosis not present

## 2021-02-23 DIAGNOSIS — E138 Other specified diabetes mellitus with unspecified complications: Secondary | ICD-10-CM | POA: Diagnosis not present

## 2021-02-23 DIAGNOSIS — N186 End stage renal disease: Secondary | ICD-10-CM | POA: Diagnosis not present

## 2021-02-23 DIAGNOSIS — D509 Iron deficiency anemia, unspecified: Secondary | ICD-10-CM | POA: Diagnosis not present

## 2021-02-23 MED ORDER — HYDRALAZINE HCL 100 MG PO TABS: 100.0000 mg | ORAL_TABLET | Freq: Two times a day (BID) | ORAL | 0 refills | Status: DC

## 2021-02-23 MED ORDER — PANTOPRAZOLE SODIUM 40 MG PO TBEC: 40.0000 mg | DELAYED_RELEASE_TABLET | Freq: Every day | ORAL | 0 refills | Status: DC

## 2021-02-23 NOTE — Progress Notes (Signed)
PERIOPERATIVE PRESCRIPTION FOR IMPLANTED CARDIAC DEVICE PROGRAMMING  Patient Information: Name:  EVIE CRUMPLER  DOB:  October 22, 1940  MRN:  [id number]     Planned Procedure:  Revision of Left AVG  Surgeon:  Dr. Jamelle Haring  Date of Procedure:  02/24/21  Cautery will be used.  Position during surgery:  Supine   Please send documentation back to:  [facility] (Fax # [phone])      Device Information:  Clinic EP Physician:  Virl Axe, MD   Device Type:  Pacemaker  Medtronic [id number] [address] MRI Manufacturer and Phone #:  [employer]onic: [phone] Pacemaker Dependent?:  No. Date of Last Device Check:  10/13/20 in clinic with adjustments made to pulse width. Normal Device Function?:  Yes.  Rising RV thresholds! Please contact industry for threshold test prior to surgery! Industry aware of time and date of surgery but will need a call prior too.  Electrophysiologist's Recommendations:  Have magnet available. Provide continuous ECG monitoring when magnet is used or reprogramming is to be performed.  Procedure will likely interfere with device function.  Device should be programmed:  Asynchronous pacing during procedure and returned to normal programming after procedure  Per Device Clinic Standing Orders, Willadean Carol, RN  8:22 AM 02/23/2021

## 2021-02-23 NOTE — Telephone Encounter (Signed)
Patients daughter called about the paperwork that was sent over from [facility] for the patient to receive a hospital bed. Patient daughter stated that what has been sent over before was not enough.

## 2021-02-23 NOTE — Progress Notes (Addendum)
I spoke to David [name] , David [name] has some memory issues.  David [name] reports that David [name] has not complained of chest pain or shortness of breath.   Patient denies having any s/s of Covid in her household.  Patient denies any known exposure to Covid.   David [name] has type II diabetes. I instcted David [name] to have patient take 1/2 of Tresiba at hs tonight.  David. David [name] reports that CBG go up and down, "this am's reading was 190."  Last A1C was 6.7 in June of this year. I instructed patient to check CBG after awaking and every 2 hours until arrival  to the hospital.  I Instructed patient if CBG is less than 70 to take 4 Glucose Tablets or 1 tube of Glucose Gel or 1/2 cup of a clear juice. Recheck CBG in 15 minutes if CBG is not over 70 call, pre- op desk at [phone] for further instructions.   PCP is Dr Dimas Chyle Dr Tressia Miners is patients cardiologist for sleep apnea Dr. Caryl Comes is patient's electrophoresis. Endocrinologist is Dr. Darrow Bussing.  I instructed David [name] to have  patient shower with antibiotic soap, if it is available.  Dry off with a clean towel. Do not put lotion, powder, cologne or deodorant or makeup.No jewelry or piercings. Men may shave their face and neck. Woman should not shave. No nail polish, artificial or acrylic nails. Wear clean clothes, brush your teeth. Glasses, contact lens,dentures or partials may not be worn in the OR. If you need to wear them, please bring a case for glasses, do not wear contacts or bring a case, the hospital does not have contact cases, dentures or partials will have to be removed , make sure they are clean, we will provide a denture cup to put them in. You will need some one to drive you home and a responsible person over the age of 5 to stay with you for the first 24 hours after surgery.

## 2021-02-23 NOTE — Telephone Encounter (Signed)
Note form [facility] received  Will call patient daughter/ wife when order fax

## 2021-02-23 NOTE — Anesthesia Preprocedure Evaluation (Addendum)
Anesthesia Evaluation  Patient identified by MRN, date of birth, ID band Patient awake    Reviewed: Allergy & Precautions, NPO status , Patient's Chart, lab work & pertinent test results  Airway Mallampati: III  TM Distance: >3 FB Neck ROM: Full    Dental  (+) Edentulous Upper, Edentulous Lower   Pulmonary sleep apnea and Continuous Positive Airway Pressure Ventilation , former smoker,    Pulmonary exam normal        Cardiovascular hypertension, Pt. on medications and Pt. on home beta blockers + CAD, + Peripheral Vascular Disease (on Plavix) and +CHF (HOCM)  + dysrhythmias Atrial Fibrillation + pacemaker  Rhythm:Irregular Rate:Normal     Neuro/Psych Anxiety Depression CVA (facial droop, memory issues), Residual Symptoms    GI/Hepatic Neg liver ROS, GERD  Medicated,  Endo/Other  diabetes, Poorly Controlled, Type 2, Insulin Dependent, Oral Hypoglycemic Agents  Renal/GU CRF and DialysisRenal disease  negative genitourinary   Musculoskeletal  (+) Arthritis , Osteoarthritis,    Abdominal (+) + obese,  Abdomen: soft.    Peds  Hematology  (+) anemia ,   Anesthesia Other Findings   Reproductive/Obstetrics                            Anesthesia Physical Anesthesia Plan  ASA: 3  Anesthesia Plan: MAC and Regional   Post-op Pain Management:    Induction: Intravenous  PONV Risk Score and Plan: Ondansetron, Dexamethasone, Propofol infusion and Treatment may vary due to age or medical condition  Airway Management Planned: Simple Face Mask, Natural Airway and Nasal Cannula  Additional Equipment: None  Intra-op Plan:   Post-operative Plan:   Informed Consent: I have reviewed the patients History and Physical, chart, labs and discussed the procedure including the risks, benefits and alternatives for the proposed anesthesia with the patient or authorized representative who has indicated his/her  understanding and acceptance.     Dental advisory given  Plan Discussed with: CRNA  Anesthesia Plan Comments: (- spoke with PPM rep, patient paced ~25% of the time, AF RVR. Asynchronous mode not recommended  Lab Results      Component                Value               Date                      WBC                      7.2                 12/03/2020                HGB                      12.9 (L)            02/24/2021                HCT                      38.0 (L)            02/24/2021                MCV  105.7 (H)           12/03/2020                PLT                      173                 12/03/2020           Lab Results      Component                Value               Date                      NA                       136                 02/24/2021                K                        4.7                 02/24/2021                CO2                      20 (L)              12/03/2020                GLUCOSE                  107 (H)             02/24/2021                BUN                      35 (H)              02/24/2021                CREATININE               6.80 (H)            02/24/2021                CALCIUM                  9.0                 12/03/2020                GFRNONAA                 14 (L)              12/03/2020             ECHO 12/21: 1. Left ventricular ejection fraction, by estimation, is 55 to 60%. The  left ventricle has normal function. The left ventricle has no regional  wall motion abnormalities. There is moderate concentric left ventricular  hypertrophy. Left ventricular  diastolic parameters are consistent with Grade II diastolic dysfunction  (pseudonormalization). Elevated left atrial  pressure.  2. Right ventricular systolic function is normal. The right ventricular  size is normal. Mildly increased right ventricular wall thickness.  Tricuspid regurgitation signal is inadequate for assessing PA pressure.  3. Left  atrial size was mildly dilated.  4. A small pericardial effusion is present. The pericardial effusion is  circumferential. There is no evidence of cardiac tamponade.  5. The mitral valve is normal in structure. No evidence of mitral valve  regurgitation.  6. 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 There is mild calcification of the  aortic valve. There is moderate thickening of the aortic valve. Aortic  valve regurgitation is trivial. Mild to moderate aortic valve  sclerosis/calcification is present, without any  evidence of aortic stenosis.  7. Aortic dilatation noted. There is mild dilatation of the aortic root,  measuring 41 mm.  8. The inferior vena cava is dilated in size with >50% respiratory  variability, suggesting right atrial pressure of 8 mmHg. )       Anesthesia Quick Evaluation

## 2021-02-24 ENCOUNTER — Ambulatory Visit (HOSPITAL_COMMUNITY): Payer: Medicare Other | Admitting: Anesthesiology

## 2021-02-24 ENCOUNTER — Encounter (HOSPITAL_COMMUNITY): Payer: Self-pay | Admitting: Vascular Surgery

## 2021-02-24 ENCOUNTER — Other Ambulatory Visit: Payer: Self-pay

## 2021-02-24 ENCOUNTER — Ambulatory Visit (HOSPITAL_COMMUNITY)
Admission: RE | Admit: 2021-02-24 | Discharge: 2021-02-24 | Disposition: A | Discharge status: Home / Self Care | Payer: Medicare Other | Source: Ambulatory Visit | Attending: Vascular Surgery | Admitting: Vascular Surgery

## 2021-02-24 ENCOUNTER — Telehealth: Payer: Medicare Other | Admitting: Family Medicine

## 2021-02-24 ENCOUNTER — Encounter: Payer: Self-pay | Admitting: Family Medicine

## 2021-02-24 ENCOUNTER — Encounter (HOSPITAL_COMMUNITY)
Admission: RE | Disposition: A | Discharge status: Home / Self Care | Payer: Self-pay | Source: Ambulatory Visit | Attending: Vascular Surgery

## 2021-02-24 DIAGNOSIS — E669 Obesity, unspecified: Secondary | ICD-10-CM | POA: Insufficient documentation

## 2021-02-24 DIAGNOSIS — E1122 Type 2 diabetes mellitus with diabetic chronic kidney disease: Secondary | ICD-10-CM | POA: Insufficient documentation

## 2021-02-24 DIAGNOSIS — I5032 Chronic diastolic (congestive) heart failure: Secondary | ICD-10-CM | POA: Diagnosis not present

## 2021-02-24 DIAGNOSIS — X58XXXA Exposure to other specified factors, initial encounter: Secondary | ICD-10-CM | POA: Diagnosis not present

## 2021-02-24 DIAGNOSIS — Z7984 Long term (current) use of oral hypoglycemic drugs: Secondary | ICD-10-CM | POA: Insufficient documentation

## 2021-02-24 DIAGNOSIS — Z7985 Long-term (current) use of injectable non-insulin antidiabetic drugs: Secondary | ICD-10-CM | POA: Insufficient documentation

## 2021-02-24 DIAGNOSIS — I132 Hypertensive heart and chronic kidney disease with heart failure and with stage 5 chronic kidney disease, or end stage renal disease: Secondary | ICD-10-CM | POA: Diagnosis not present

## 2021-02-24 DIAGNOSIS — Z950 Presence of cardiac pacemaker: Secondary | ICD-10-CM | POA: Insufficient documentation

## 2021-02-24 DIAGNOSIS — Z794 Long term (current) use of insulin: Secondary | ICD-10-CM | POA: Diagnosis not present

## 2021-02-24 DIAGNOSIS — I4891 Unspecified atrial fibrillation: Secondary | ICD-10-CM | POA: Diagnosis not present

## 2021-02-24 DIAGNOSIS — Z888 Allergy status to other drugs, medicaments and biological substances status: Secondary | ICD-10-CM | POA: Insufficient documentation

## 2021-02-24 DIAGNOSIS — T82590A Other mechanical complication of surgically created arteriovenous fistula, initial encounter: Secondary | ICD-10-CM | POA: Diagnosis not present

## 2021-02-24 DIAGNOSIS — Z7952 Long term (current) use of systemic steroids: Secondary | ICD-10-CM | POA: Insufficient documentation

## 2021-02-24 DIAGNOSIS — Z992 Dependence on renal dialysis: Secondary | ICD-10-CM | POA: Diagnosis not present

## 2021-02-24 DIAGNOSIS — Z79899 Other long term (current) drug therapy: Secondary | ICD-10-CM | POA: Diagnosis not present

## 2021-02-24 DIAGNOSIS — Z91048 Other nonmedicinal substance allergy status: Secondary | ICD-10-CM | POA: Diagnosis not present

## 2021-02-24 DIAGNOSIS — D631 Anemia in chronic kidney disease: Secondary | ICD-10-CM | POA: Insufficient documentation

## 2021-02-24 DIAGNOSIS — T82898A Other specified complication of vascular prosthetic devices, implants and grafts, initial encounter: Secondary | ICD-10-CM | POA: Diagnosis not present

## 2021-02-24 DIAGNOSIS — Z6833 Body mass index (BMI) 33.0-33.9, adult: Secondary | ICD-10-CM | POA: Diagnosis not present

## 2021-02-24 DIAGNOSIS — Z87891 Personal history of nicotine dependence: Secondary | ICD-10-CM | POA: Diagnosis not present

## 2021-02-24 DIAGNOSIS — N186 End stage renal disease: Secondary | ICD-10-CM | POA: Insufficient documentation

## 2021-02-24 DIAGNOSIS — I509 Heart failure, unspecified: Secondary | ICD-10-CM | POA: Diagnosis not present

## 2021-02-24 HISTORY — DX: Unspecified hearing loss, unspecified ear: H91.90

## 2021-02-24 HISTORY — DX: Cardiac arrhythmia, unspecified: I49.9

## 2021-02-24 HISTORY — PX: REVISON OF ARTERIOVENOUS FISTULA: SHX6074

## 2021-02-24 LAB — GLUCOSE, CAPILLARY
Glucose-Capillary: 104 mg/dL — ABNORMAL HIGH (ref 70–99)
Glucose-Capillary: 98 mg/dL (ref 70–99)

## 2021-02-24 LAB — POCT I-STAT, CHEM 8
BUN: 35 mg/dL — ABNORMAL HIGH (ref 8–23)
Calcium, Ion: 1.03 mmol/L — ABNORMAL LOW (ref 1.15–1.40)
Chloride: 98 mmol/L (ref 98–111)
Creatinine, Ser: 6.8 mg/dL — ABNORMAL HIGH (ref 0.61–1.24)
Glucose, Bld: 107 mg/dL — ABNORMAL HIGH (ref 70–99)
HCT: 38 % — ABNORMAL LOW (ref 39.0–52.0)
Hemoglobin: 12.9 g/dL — ABNORMAL LOW (ref 13.0–17.0)
Potassium: 4.7 mmol/L (ref 3.5–5.1)
Sodium: 136 mmol/L (ref 135–145)
TCO2: 27 mmol/L (ref 22–32)

## 2021-02-24 SURGERY — REVISON OF ARTERIOVENOUS FISTULA
Anesthesia: Monitor Anesthesia Care | Site: Arm Upper | Laterality: Left

## 2021-02-24 MED ORDER — CHLORHEXIDINE GLUCONATE 0.12 % MT SOLN
OROMUCOSAL | Status: AC
  Administered 2021-02-24: 15 mL via OROMUCOSAL
  Filled 2021-02-24: qty 15

## 2021-02-24 MED ORDER — HEPARIN 6000 UNIT IRRIGATION SOLUTION
Status: AC
  Filled 2021-02-24: qty 500

## 2021-02-24 MED ORDER — DEXAMETHASONE SODIUM PHOSPHATE 10 MG/ML IJ SOLN
INTRAMUSCULAR | Status: AC
  Filled 2021-02-24: qty 1

## 2021-02-24 MED ORDER — HEMOSTATIC AGENTS (NO CHARGE) OPTIME
TOPICAL | Status: DC | PRN
  Administered 2021-02-24: 1 via TOPICAL

## 2021-02-24 MED ORDER — TRAMADOL HCL 50 MG PO TABS: 50.0000 mg | ORAL_TABLET | Freq: Four times a day (QID) | ORAL | 0 refills | Status: DC | PRN

## 2021-02-24 MED ORDER — DEXAMETHASONE SODIUM PHOSPHATE 10 MG/ML IJ SOLN
INTRAMUSCULAR | Status: DC | PRN
  Administered 2021-02-24: 4 mg via INTRAVENOUS

## 2021-02-24 MED ORDER — SODIUM CHLORIDE 0.9 % IV SOLN: INTRAVENOUS | Status: DC

## 2021-02-24 MED ORDER — CHLORHEXIDINE GLUCONATE 4 % EX LIQD: 60.0000 mL | Freq: Once | CUTANEOUS | Status: DC

## 2021-02-24 MED ORDER — CLOPIDOGREL BISULFATE 75 MG PO TABS: 75.0000 mg | ORAL_TABLET | Freq: Every day | ORAL | 3 refills | Status: DC

## 2021-02-24 MED ORDER — HEPARIN 6000 UNIT IRRIGATION SOLUTION
Status: DC | PRN
  Administered 2021-02-24: 1

## 2021-02-24 MED ORDER — FENTANYL CITRATE (PF) 100 MCG/2ML IJ SOLN: 25.0000 ug | INTRAMUSCULAR | Status: DC | PRN

## 2021-02-24 MED ORDER — PROPOFOL 10 MG/ML IV BOLUS
INTRAVENOUS | Status: DC | PRN
  Administered 2021-02-24: 10 mg via INTRAVENOUS

## 2021-02-24 MED ORDER — PHENYLEPHRINE 40 MCG/ML (10ML) SYRINGE FOR IV PUSH (FOR BLOOD PRESSURE SUPPORT)
PREFILLED_SYRINGE | INTRAVENOUS | Status: AC
  Filled 2021-02-24: qty 10

## 2021-02-24 MED ORDER — PHENYLEPHRINE HCL (PRESSORS) 10 MG/ML IV SOLN
INTRAVENOUS | Status: DC | PRN
  Administered 2021-02-24 (×4): 80 ug via INTRAVENOUS

## 2021-02-24 MED ORDER — PHENYLEPHRINE HCL-NACL 20-0.9 MG/250ML-% IV SOLN
INTRAVENOUS | Status: DC | PRN
  Administered 2021-02-24: 50 ug/min via INTRAVENOUS

## 2021-02-24 MED ORDER — PROPOFOL 1000 MG/100ML IV EMUL
INTRAVENOUS | Status: AC
  Filled 2021-02-24: qty 100

## 2021-02-24 MED ORDER — ONDANSETRON HCL 4 MG/2ML IJ SOLN
INTRAMUSCULAR | Status: AC
  Filled 2021-02-24: qty 2

## 2021-02-24 MED ORDER — HEPARIN SODIUM (PORCINE) 1000 UNIT/ML IJ SOLN
INTRAMUSCULAR | Status: DC | PRN
  Administered 2021-02-24: 5000 [IU] via INTRAVENOUS

## 2021-02-24 MED ORDER — CHLORHEXIDINE GLUCONATE 0.12 % MT SOLN: 15.0000 mL | Freq: Once | OROMUCOSAL | Status: AC

## 2021-02-24 MED ORDER — LIDOCAINE-EPINEPHRINE (PF) 1.5 %-1:200000 IJ SOLN
INTRAMUSCULAR | Status: DC | PRN
  Administered 2021-02-24: 30 mL via PERINEURAL

## 2021-02-24 MED ORDER — FENTANYL CITRATE (PF) 250 MCG/5ML IJ SOLN
INTRAMUSCULAR | Status: AC
  Filled 2021-02-24: qty 5

## 2021-02-24 MED ORDER — PROPOFOL 500 MG/50ML IV EMUL
INTRAVENOUS | Status: DC | PRN
  Administered 2021-02-24: 60 ug/kg/min via INTRAVENOUS

## 2021-02-24 MED ORDER — SODIUM CHLORIDE 0.9 % IV SOLN: INTRAVENOUS | Status: DC | PRN

## 2021-02-24 MED ORDER — PROPOFOL 10 MG/ML IV BOLUS
INTRAVENOUS | Status: AC
  Filled 2021-02-24: qty 20

## 2021-02-24 MED ORDER — ACETAMINOPHEN 10 MG/ML IV SOLN: 1000.0000 mg | Freq: Once | INTRAVENOUS | Status: DC | PRN

## 2021-02-24 MED ORDER — CEFAZOLIN SODIUM-DEXTROSE 2-4 GM/100ML-% IV SOLN
2.0000 g | INTRAVENOUS | Status: AC
  Administered 2021-02-24: 2 g via INTRAVENOUS
  Filled 2021-02-24: qty 100

## 2021-02-24 MED ORDER — 0.9 % SODIUM CHLORIDE (POUR BTL) OPTIME
TOPICAL | Status: DC | PRN
  Administered 2021-02-24 (×2): 1000 mL

## 2021-02-24 MED ORDER — LIDOCAINE-EPINEPHRINE (PF) 1 %-1:200000 IJ SOLN
INTRAMUSCULAR | Status: AC
  Filled 2021-02-24: qty 30

## 2021-02-24 MED ORDER — FENTANYL CITRATE (PF) 100 MCG/2ML IJ SOLN
INTRAMUSCULAR | Status: DC | PRN
  Administered 2021-02-24: 50 ug via INTRAVENOUS

## 2021-02-24 SURGICAL SUPPLY — 47 items
ARMBAND PINK RESTRICT EXTREMIT (MISCELLANEOUS) ×2 IMPLANT
BAG COUNTER SPONGE SURGICOUNT (BAG) IMPLANT
BENZOIN TINCTURE PRP APPL 2/3 (GAUZE/BANDAGES/DRESSINGS) IMPLANT
CANISTER SUCT 3000ML PPV (MISCELLANEOUS) ×2 IMPLANT
CANNULA VESSEL 3MM 2 BLNT TIP (CANNULA) ×4 IMPLANT
CHLORAPREP W/TINT 26 (MISCELLANEOUS) ×2 IMPLANT
CLIP LIGATING EXTRA MED SLVR (CLIP) ×2 IMPLANT
CLIP LIGATING EXTRA SM BLUE (MISCELLANEOUS) ×2 IMPLANT
CLIP VESOCCLUDE MED 6/CT (CLIP) IMPLANT
CLIP VESOCCLUDE SM WIDE 6/CT (CLIP) IMPLANT
COVER PROBE W GEL 5X96 (DRAPES) ×2 IMPLANT
DERMABOND ADVANCED (GAUZE/BANDAGES/DRESSINGS) ×1
DERMABOND ADVANCED .7 DNX12 (GAUZE/BANDAGES/DRESSINGS) ×1 IMPLANT
ELECT REM PT RETURN 9FT ADLT (ELECTROSURGICAL) ×2
ELECTRODE REM PT RTRN 9FT ADLT (ELECTROSURGICAL) ×1 IMPLANT
GAUZE 4X4 16PLY ~~LOC~~+RFID DBL (SPONGE) ×2 IMPLANT
GLOVE SURG POLYISO LF SZ8 (GLOVE) ×2 IMPLANT
GLOVE SURG UNDER POLY LF SZ6.5 (GLOVE) ×2 IMPLANT
GLOVE SURG UNDER POLY LF SZ7.5 (GLOVE) ×2 IMPLANT
GOWN STRL REUS W/ TWL LRG LVL3 (GOWN DISPOSABLE) ×2 IMPLANT
GOWN STRL REUS W/ TWL XL LVL3 (GOWN DISPOSABLE) ×1 IMPLANT
GOWN STRL REUS W/TWL LRG LVL3 (GOWN DISPOSABLE) ×2
GOWN STRL REUS W/TWL XL LVL3 (GOWN DISPOSABLE) ×1
GRAFT COLLAGEN VASCULAR 6X15 (Vascular Products) ×1 IMPLANT
GRAFT COLLAGEN VASCULAR 6X19 (Vascular Products) ×1 IMPLANT
HEMOSTAT SNOW SURGICEL 2X4 (HEMOSTASIS) ×2 IMPLANT
INSERT FOGARTY SM (MISCELLANEOUS) IMPLANT
KIT BASIN OR (CUSTOM PROCEDURE TRAY) ×2 IMPLANT
KIT TURNOVER KIT B (KITS) ×2 IMPLANT
MAT PREVALON FULL STRYKER (MISCELLANEOUS) ×2 IMPLANT
NEEDLE 18GX1X1/2 (RX/OR ONLY) (NEEDLE) ×2 IMPLANT
NS IRRIG 1000ML POUR BTL (IV SOLUTION) ×2 IMPLANT
PACK CV ACCESS (CUSTOM PROCEDURE TRAY) ×2 IMPLANT
PAD ARMBOARD 7.5X6 YLW CONV (MISCELLANEOUS) ×4 IMPLANT
SPONGE T-LAP 18X18 ~~LOC~~+RFID (SPONGE) ×2 IMPLANT
STRIP CLOSURE SKIN 1/2X4 (GAUZE/BANDAGES/DRESSINGS) IMPLANT
SUT MNCRL AB 4-0 PS2 18 (SUTURE) ×2 IMPLANT
SUT PROLENE 6 0 BV (SUTURE) ×4 IMPLANT
SUT SILK 2 0 SH (SUTURE) ×2 IMPLANT
SUT VIC AB 3-0 SH 27 (SUTURE) ×2
SUT VIC AB 3-0 SH 27X BRD (SUTURE) ×2 IMPLANT
SUT VIC AB 4-0 PS2 18 (SUTURE) ×2 IMPLANT
SYR 3ML LL SCALE MARK (SYRINGE) ×2 IMPLANT
SYR 50ML LL SCALE MARK (SYRINGE) ×2 IMPLANT
TOWEL GREEN STERILE (TOWEL DISPOSABLE) ×2 IMPLANT
UNDERPAD 30X36 HEAVY ABSORB (UNDERPADS AND DIAPERS) ×2 IMPLANT
WATER STERILE IRR 1000ML POUR (IV SOLUTION) ×2 IMPLANT

## 2021-02-24 NOTE — Progress Notes (Signed)
Orthopedic Tech Progress Note Patient Details:  David [name] 1941/03/19 [id number]  PACU RN called requesting an [place] for patient   Ortho Devices Type of Ortho Device: Arm sling Ortho Device/Splint Location: LUE Ortho Device/Splint Interventions: Other (comment)   Post Interventions Patient Tolerated: Well Instructions Provided: Care of device  Janit Pagan 02/24/2021, 2:03 PM

## 2021-02-24 NOTE — Discharge Instructions (Signed)
[facility]  Discharge Instructions  AV Fistula or Graft Surgery for Dialysis Access  Please refer to the following instructions for your post-procedure care. Your surgeon or physician assistant will discuss any changes with you.  Activity  You may drive the day following your surgery, if you are comfortable and no longer taking prescription pain medication. Resume full activity as the soreness in your incision resolves.  Bathing/Showering  You may shower after you go home. Keep your incision dry for 48 hours. Do not soak in a bathtub, hot tub, or swim until the incision heals completely. You may not shower if you have a hemodialysis catheter.  Incision Care  Clean your incision with mild soap and water after 48 hours. Pat the area dry with a clean towel. You do not need a bandage unless otherwise instructed. Do not apply any ointments or creams to your incision. You may have skin glue on your incision. Do not peel it off. It will come off on its own in about one week. Your arm may swell a bit after surgery. To reduce swelling use pillows to elevate your arm so it is above your heart. Your doctor will tell you if you need to lightly wrap your arm with an ACE bandage.  Diet  Resume your normal diet. There are not special food restrictions following this procedure. In order to heal from your surgery, it is CRITICAL to get adequate nutrition. Your body requires vitamins, minerals, and protein. Vegetables are the best source of vitamins and minerals. Vegetables also provide the perfect balance of protein. Processed food has little nutritional value, so try to avoid this.  Medications  Resume taking all of your medications. If your incision is causing pain, you may take over-the counter pain relievers such as acetaminophen (Tylenol). If you were prescribed a stronger pain medication, please be aware these medications can cause nausea and constipation. Prevent  nausea by taking the medication with a snack or meal. Avoid constipation by drinking plenty of fluids and eating foods with high amount of fiber, such as fruits, vegetables, and grains.  Do not take Tylenol if you are taking prescription pain medications.  Follow up Your surgeon may want to see you in the office following your access surgery. If so, this will be arranged at the time of your surgery.  Please call us immediately for any of the following conditions:  Increased pain, redness, drainage (pus) from your incision site Fever of 101 degrees or higher Severe or worsening pain at your incision site Hand pain or numbness.  Reduce your risk of vascular disease:  Stop smoking. If you would like help, call [facility] at 1-800-QUIT-NOW 416-103-6423) or [facility] at [place] your cholesterol Maintain a desired weight Control your diabetes Keep your blood pressure down  Dialysis  It will take several weeks to several months for your new dialysis access to be ready for use. Your surgeon will determine when it is okay to use it. Your nephrologist will continue to direct your dialysis. You can continue to use your Permcath until your new access is ready for use.   02/24/2021 David [name] [id number] 1940/09/12  Surgeon(s): Cherre Robins, MD  Procedure(s): REVISION OF LEFT ARTERIOVENOUS FISTULA with INTERPOSITION GRAFT USING ARTEGRAFT COLLAGEN GRAFT   May stick graft immediately   May stick graft on designated area only:   X Do not stick left AV graft for 6 weeks    If you have  any questions, please call the office at [phone].

## 2021-02-24 NOTE — Evaluation (Signed)
Thrill present in both sites on left arm

## 2021-02-24 NOTE — Anesthesia Procedure Notes (Addendum)
Anesthesia Procedure Note Procedure Name: MAC Date/Time: 02/23/2021 08:10  Performed [id number] Orion Modest, CRNA  Pre-anesthesia Checklist: Patient identified, Emergency Drugs available, Suction available, Timeout performed and Patient being monitored Patient Re-evaluated:Patient Re-evaluated prior to induction Oxygen Delivery Method:Nasal Cannula  Induction Type: IV induction Dental Injury: Teeth and Oropharynx as per pre-operative assessment  Medications: Fentanyl 50 mcg IV

## 2021-02-24 NOTE — Anesthesia Postprocedure Evaluation (Signed)
Anesthesia Post Note  Patient: David [name]  Procedure(s) Performed: REVISION OF LEFT ARTERIOVENOUS FISTULA with INTERPOSITION GRAFT USING ARTEGRAFT COLLAGEN GRAFT (Left: Arm Upper)     Patient location during evaluation: PACU Anesthesia Type: Regional and MAC Level of consciousness: awake and alert Pain management: pain level controlled Vital Signs Assessment: post-procedure vital signs reviewed and stable Respiratory status: spontaneous breathing, nonlabored ventilation, respiratory function stable and patient connected to nasal cannula oxygen Cardiovascular status: stable and blood pressure returned to baseline Postop Assessment: no apparent nausea or vomiting Anesthetic complications: no   No notable events documented.  Last Vitals:  Vitals:   02/24/21 1015 02/24/21 1030  BP: 107/76 104/64  Pulse: (!) 105 79  Resp: 15 (!) 31  Temp:  (!) 36.1 C  SpO2: 92% 94%    Last Pain:  Vitals:   02/24/21 1030  TempSrc:   PainSc: 0-No pain                 Belenda Cruise P Everli Rother

## 2021-02-24 NOTE — Progress Notes (Signed)
Called and spoke to [name] with [employer] to notify of patient's location in Short Stay for device programming.

## 2021-02-24 NOTE — Anesthesia Procedure Notes (Signed)
Anesthesia Regional Block: Supraclavicular block   Pre-Anesthetic Checklist: , timeout performed,  Correct Patient, Correct Site, Correct Laterality,  Correct Procedure, Correct Position, site marked,  Risks and benefits discussed,  Surgical consent,  Pre-op evaluation,  At surgeon's request and post-op pain management  Laterality: Left  Prep: Dura Prep       Needles:  Injection technique: Single-shot  Needle Type: Echogenic Stimulator Needle     Needle Length: 5cm  Needle Gauge: 20     Additional Needles:   Procedures:,,,, ultrasound used (permanent image in chart),,    Narrative:  Start time: 02/24/2021 8:10 AM End time: 02/24/2021 8:13 AM Injection made incrementally with aspirations every 5 mL.  Performed by: Personally  Anesthesiologist: Darral Dash, DO  Additional Notes: Patient identified. Risks/Benefits/Options discussed with patient including but not limited to bleeding, infection, nerve damage, failed block, incomplete pain control. Patient expressed understanding and wished to proceed. All questions were answered. Sterile technique was used throughout the entire procedure. Please see nursing notes for vital signs. Aspirated in 5cc intervals with injection for negative confirmation. Patient was given instructions on fall risk and not to get out of bed. All questions and concerns addressed with instructions to call with any issues or inadequate analgesia.

## 2021-02-24 NOTE — Progress Notes (Signed)
Patient was not seen or evaluated today.  [name], MD 02/24/2021 10:22 AM

## 2021-02-24 NOTE — Interval H&P Note (Signed)
History and Physical Interval Note:  02/24/2021 8:24 AM  David [name]  has presented today for surgery, with the diagnosis of End Stage Renal Disease.  The various methods of treatment have been discussed with the patient and family. After consideration of risks, benefits and other options for treatment, the patient has consented to  Procedure(s): REVISION OF LEFT ARTERIOVENOUS FISTULA (Left) as a surgical intervention.  The patient's history has been reviewed, patient examined, no change in status, stable for surgery.  I have reviewed the patient's chart and labs.  Questions were answered to the patient's satisfaction.     Cherre Robins

## 2021-02-24 NOTE — Telephone Encounter (Signed)
Received a call from [facility], they received an order for a hospital bed and they are missing the bed narrative. Asking if that can be re-faxed.

## 2021-02-24 NOTE — Anesthesia Procedure Notes (Deleted)
Anesthesia Procedure Note     

## 2021-02-24 NOTE — Transfer of Care (Signed)
Immediate Anesthesia Transfer of Care Note  Patient: David [name]  Procedure(s) Performed: REVISION OF LEFT ARTERIOVENOUS FISTULA with INTERPOSITION GRAFT USING ARTEGRAFT COLLAGEN GRAFT (Left: Arm Upper)  Patient Location: PACU  Anesthesia Type:MAC and Regional  Level of Consciousness: awake, alert  and oriented  Airway & Oxygen Therapy: Patient Spontanous Breathing and Patient connected to face mask oxygen  Post-op Assessment: Report given to RN and Post -op Vital signs reviewed and stable  Post vital signs: Reviewed and stable  Last Vitals:  Vitals Value Taken Time  BP 90/42 02/24/21 0959  Temp    Pulse 51 02/24/21 1000  Resp 22 02/24/21 1000  SpO2 100 % 02/24/21 1000  Vitals shown include unvalidated device data.  Last Pain:  Vitals:   02/24/21 0700  TempSrc:   PainSc: 0-No pain         Complications: No notable events documented.

## 2021-02-24 NOTE — Op Note (Signed)
DATE OF SERVICE: 02/24/2021  PATIENT:  David [name]  80 y.o. male  PRE-OPERATIVE DIAGNOSIS:  ESRD  POST-OPERATIVE DIAGNOSIS:  Same  PROCEDURE:   left upper extremity revision of arteriovenous fistula with interposition bovine carotid graft  SURGEON:  Surgeon(s) and Role:    * Cherre Robins, MD - Primary  ASSISTANT: Paulo Fruit, PA-C  An assistant was required to facilitate exposure and expedite the case.  ANESTHESIA:   regional and MAC  EBL: min  BLOOD ADMINISTERED:none  DRAINS: none   LOCAL MEDICATIONS USED:  NONE  SPECIMEN:  none  COUNTS: confirmed correct.  TOURNIQUET:  none  PATIENT DISPOSITION:  PACU - hemodynamically stable.   Delay start of Pharmacological VTE agent (>24hrs) due to surgical blood loss or risk of bleeding: no  INDICATION FOR PROCEDURE: SELIG [name] is a 80 y.o. male with end-stage renal disease.  I created a left brachiocephalic arteriovenous fistula for him 04/16/2020.  This was later revised 08/06/2020 with slight branch ligation.  The fistula has failed to be usable at dialysis.  He continues to dialyze through a right internal jugular tunneled dialysis catheter.. After careful discussion of risks, benefits, and alternatives the patient was offered interposition grafting. The patient understood and wished to proceed.  OPERATIVE FINDINGS: Healthy, but tortuous brachiocephalic arteriovenous fistula in the upper arm.  Interposition bovine carotid Artegraft placed without difficulty.  At completion there was slight pulsatility to the AV fistula/graft hybrid.  The outflow had a smooth thrill.  DESCRIPTION OF PROCEDURE: After identification of the patient in the pre-operative holding area, the patient was transferred to the operating room. The patient was positioned supine on the operating room table. Anesthesia was induced. The left arm was prepped and draped in standard fashion. A surgical pause was performed confirming correct patient,  procedure, and operative location.  Previous incisions at the proximal and distal fistula were reopened sharply with a 10 blade.  The fistula was exposed circumferentially at the sites and clamped proximally and distally.  A bovine carotid graft was brought on the field and prepared as per the manufacturer's instructions.  A subcutaneous tunnel was created with a sheath tunneling device connecting the 2 exposures.  The patient was systemically heparinized with 5000 units of IV heparin.  The proximal fistula was clamped and distally.  The distal end was ligated with a 2-0 silk suture.  The proximal stump of the fistula and the proximal aspect of the vascular graft were anastomosed end-to-end using continuous running suture of 6-0 Prolene.  The distal end of the graft was clamped.  Hemostasis was achieved to the anastomosis.  The distal fistula was then clamped proximally and distally and divided sharply.  The peripheral aspect of the fistula was ligated with a 2-0 silk suture.  The central aspect of the fistula was then spatulated and anastomosed end-to-side to the distal aspect of the vascular graft.  This was done with continuous running suture of 6-0 Prolene.  Prior to completion the anastomosis was flushed.  1 area of leak was noted and repaired with a simple stitch.  Clamps were released.  The fistula/graft hybrid was interrogated with a Doppler machine.  Slightly pulsatile thrill was noted flowing into the graft.  There is a smooth thrill at the fistula over the shoulder.  We carefully inspected the anastomosis and noted neither to be stenotic.  I felt this was likely due to the change in compliance entering the bovine Artegraft.  Hemostasis was achieved in the anastomoses and  surgical beds.  The wounds were closed in layers using 3-0 Vicryl and 4-0 Monocryl.  Dermabond was applied.  Upon completion of the case instrument and sharps counts were confirmed correct. The patient was transferred to the PACU in  good condition. I was present for all portions of the procedure.  David Aline. Stanford Breed, MD [facility] Phone Number: [phone] 02/24/2021 9:45 AM

## 2021-02-25 ENCOUNTER — Encounter (HOSPITAL_COMMUNITY): Payer: Self-pay | Admitting: Vascular Surgery

## 2021-02-25 ENCOUNTER — Telehealth (INDEPENDENT_AMBULATORY_CARE_PROVIDER_SITE_OTHER): Payer: Medicare Other | Admitting: Family Medicine

## 2021-02-25 DIAGNOSIS — R5381 Other malaise: Secondary | ICD-10-CM | POA: Diagnosis not present

## 2021-02-25 DIAGNOSIS — N2581 Secondary hyperparathyroidism of renal origin: Secondary | ICD-10-CM | POA: Diagnosis not present

## 2021-02-25 DIAGNOSIS — I5030 Unspecified diastolic (congestive) heart failure: Secondary | ICD-10-CM | POA: Diagnosis not present

## 2021-02-25 DIAGNOSIS — D509 Iron deficiency anemia, unspecified: Secondary | ICD-10-CM | POA: Diagnosis not present

## 2021-02-25 DIAGNOSIS — Z992 Dependence on renal dialysis: Secondary | ICD-10-CM | POA: Diagnosis not present

## 2021-02-25 DIAGNOSIS — N186 End stage renal disease: Secondary | ICD-10-CM | POA: Diagnosis not present

## 2021-02-25 DIAGNOSIS — E138 Other specified diabetes mellitus with unspecified complications: Secondary | ICD-10-CM | POA: Diagnosis not present

## 2021-02-25 DIAGNOSIS — Z23 Encounter for immunization: Secondary | ICD-10-CM | POA: Diagnosis not present

## 2021-02-25 NOTE — Telephone Encounter (Signed)
Patient has appointment today F/U order  Narrative will be fax after appointment

## 2021-02-25 NOTE — Progress Notes (Signed)
   ABE SCHOOLS is a 80 y.o. male who presents today for a virtual office visit.  Assessment/Plan:  Chronic Problems Addressed Today: Debility Will fax over order for some electric hospital bed.  Hopefully this will help provide some relief with his chronic neck and back pain.  Family is pursuing palliative care.  (HFpEF) heart failure with preserved ejection fraction (HCC) Volume status managed via dialysis.  Hopefully will have some improvement in orthopnea with hospital bed.    Subjective:  HPI:  He requires the head of the bed to be elevated more than 30 degrees most of the time due to CHF.  Additionally he has limited caregiver support at home and he is physically unable to reposition independently.  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 Needs assistance with repositioning due to debility and he needs frequent repositioning to treat pain and prevent pressure injuries.         Objective/Observations  Physical Exam: Gen: NAD, resting comfortably Psych: Normal affect and thought content  Virtual Visit via Video   I connected with [name] on 02/25/21 at  4:00 PM EDT by a video enabled telemedicine application and verified that I am speaking with the correct person using two identifiers. The limitations of evaluation and management by telemedicine and the availability of in person appointments were discussed. The patient expressed understanding and agreed to proceed.   Patient location: Home Provider location: [place] participating in the virtual visit: Myself and Patient's wife     [name], MD 02/25/2021 3:49 PM

## 2021-02-25 NOTE — Assessment & Plan Note (Signed)
Volume status managed via dialysis.  Hopefully will have some improvement in orthopnea with hospital bed.

## 2021-02-25 NOTE — Assessment & Plan Note (Addendum)
Will fax over order for some electric hospital bed.  Hopefully this will help provide some relief with his chronic neck and back pain.  Family is pursuing palliative care.

## 2021-02-26 ENCOUNTER — Other Ambulatory Visit: Payer: Self-pay | Admitting: Family Medicine

## 2021-02-26 ENCOUNTER — Telehealth: Payer: Self-pay

## 2021-02-26 ENCOUNTER — Other Ambulatory Visit: Payer: Self-pay | Admitting: *Deleted

## 2021-02-26 DIAGNOSIS — E1122 Type 2 diabetes mellitus with diabetic chronic kidney disease: Secondary | ICD-10-CM | POA: Diagnosis not present

## 2021-02-26 DIAGNOSIS — I739 Peripheral vascular disease, unspecified: Secondary | ICD-10-CM | POA: Diagnosis not present

## 2021-02-26 DIAGNOSIS — E1140 Type 2 diabetes mellitus with diabetic neuropathy, unspecified: Secondary | ICD-10-CM | POA: Diagnosis not present

## 2021-02-26 DIAGNOSIS — I421 Obstructive hypertrophic cardiomyopathy: Secondary | ICD-10-CM

## 2021-02-26 DIAGNOSIS — R531 Weakness: Secondary | ICD-10-CM | POA: Diagnosis not present

## 2021-02-26 DIAGNOSIS — I120 Hypertensive chronic kidney disease with stage 5 chronic kidney disease or end stage renal disease: Secondary | ICD-10-CM | POA: Diagnosis not present

## 2021-02-26 DIAGNOSIS — M79642 Pain in left hand: Secondary | ICD-10-CM | POA: Diagnosis not present

## 2021-02-26 NOTE — Telephone Encounter (Signed)
DME placed and faxed to [phone]

## 2021-02-26 NOTE — Telephone Encounter (Signed)
[facility] called and stated they need a new order sent over since the old was dated before his last appt.

## 2021-02-26 NOTE — Telephone Encounter (Signed)
Can you handle this please? 

## 2021-02-27 DIAGNOSIS — Z23 Encounter for immunization: Secondary | ICD-10-CM | POA: Diagnosis not present

## 2021-02-27 DIAGNOSIS — N2581 Secondary hyperparathyroidism of renal origin: Secondary | ICD-10-CM | POA: Diagnosis not present

## 2021-02-27 DIAGNOSIS — D509 Iron deficiency anemia, unspecified: Secondary | ICD-10-CM | POA: Diagnosis not present

## 2021-02-27 DIAGNOSIS — Z992 Dependence on renal dialysis: Secondary | ICD-10-CM | POA: Diagnosis not present

## 2021-02-27 DIAGNOSIS — E138 Other specified diabetes mellitus with unspecified complications: Secondary | ICD-10-CM | POA: Diagnosis not present

## 2021-02-27 DIAGNOSIS — N186 End stage renal disease: Secondary | ICD-10-CM | POA: Diagnosis not present

## 2021-03-01 ENCOUNTER — Telehealth: Payer: Self-pay | Admitting: Pharmacist

## 2021-03-01 NOTE — Chronic Care Management (AMB) (Addendum)
Chronic Care Management Pharmacy Assistant   Name: David [name]  MRN: [id number] DOB: Nov 24, 1940   Reason for Encounter: Diabetes Adherence Call   Recent office visits:  02/25/2021 OV (PCP) [name], MD; no medication changes indicated.  Recent consult visits:  02/15/2021 OV (vasc surg) Baglia, Corrina, PA-C; no medication changes indicated.  Hospital visits:  10/12/202 Admission ESRD on dialysis - [place], PA-C David [name] is a 80 y.o. male who presents for follow up of left brachiocephalic AV Fistula. He has some tolerable steal symptoms with numbness in his left fingers. His duplex today shows patent fistula that is tortuous. On exam has a great thrill but it is tortuous. Recommend revision of the fistula to attempt to salvage it and straighten it out.  Patient is on Plavix which will need to be held prior to surgery. Will arrange revision of his Left AVF with Dr. Stanford Breed in the near future.  Medications: Outpatient Encounter Medications as of 03/01/2021  Medication Sig   acetaminophen (TYLENOL) 500 MG tablet Take 1,000 mg by mouth 2 (two) times daily as needed (pain.).   allopurinol (ZYLOPRIM) 100 MG tablet Take 1 tablet (100 mg total) by mouth daily. (Patient taking differently: Take 100 mg by mouth every evening.)   amLODipine (NORVASC) 5 MG tablet TAKE 1 TABLET BY MOUTH EVERY DAY   atorvastatin (LIPITOR) 10 MG tablet Take 1 tablet (10 mg total) by mouth every evening.   blood glucose meter kit and supplies KIT Dispense based on patient and insurance preference. Use up to four times daily as directed. (FOR ICD-9 250.00, 250.01).   calcitRIOL (ROCALTROL) 0.25 MCG capsule Take 1 capsule (0.25 mcg total) by mouth daily.   carvedilol (COREG) 25 MG tablet TAKE 1 TABLET BY MOUTH TWICE DAILY WITH A MEAL   cetirizine (ZYRTEC) 10 MG tablet Take 10 mg by mouth daily.   clopidogrel (PLAVIX) 75 MG tablet Take 1 tablet (75 mg total) by mouth daily. Restart on 02/25/21    Darbepoetin Alfa (ARANESP, ALBUMIN FREE,) 60 MCG/ML SOLN Inject 60 mcg as directed every 30 (thirty) days.   Epoetin Alfa (EPOGEN IJ) Inject as directed See admin instructions. Every 4 weeks   folic acid (FOLVITE) 1 MG tablet Take 1 mg by mouth in the morning.   folic acid-vitamin b complex-vitamin c-selenium-zinc (DIALYVITE) 3 MG TABS tablet Take 1 tablet by mouth every evening.   glucose blood test strip Use Contour test strips as instructed to check blood sugar four times daily.   hydrALAZINE (APRESOLINE) 100 MG tablet Take 1 tablet (100 mg total) by mouth 2 (two) times daily.   insulin aspart (NOVOLOG FLEXPEN) 100 UNIT/ML FlexPen Inject 10-12 Units into the skin See admin instructions. Inject 10 units subcutaneously before breakfast and 12 units before supper (Patient taking differently: Inject 8 Units into the skin in the morning and at bedtime.)   insulin degludec (TRESIBA FLEXTOUCH) 200 UNIT/ML FlexTouch Pen INJECT 44 UNITS SUBCUTANEOUSLY ONCE DAILY (Patient taking differently: Inject 36 Units into the skin every evening.)   Insulin Syringe-Needle U-100 (INSULIN SYRINGE 1CC/31GX5/16") 31G X 5/16" 1 ML MISC 1 each by Does not apply route 3 (three) times daily. Use insulin syringe to inject insulin three times daily.   isosorbide mononitrate (IMDUR) 30 MG 24 hr tablet TAKE 2 TABLETS DAILY. (Patient taking differently: Take 60 mg by mouth every evening. TAKE 2 TABLETS DAILY.)   liraglutide (VICTOZA) 18 MG/3ML SOPN Inject 1.8 mg into the skin daily before supper. (  Patient taking differently: Inject 1.2 mg into the skin daily before supper.)   Omega-3 Fatty Acids (FISH OIL) 1000 MG CAPS Take 1 capsule (1,000 mg total) by mouth every morning.   pantoprazole (PROTONIX) 40 MG tablet Take 1 tablet (40 mg total) by mouth daily.   sertraline (ZOLOFT) 100 MG tablet Take 1 tablet (100 mg total) by mouth daily. (Patient taking differently: Take 100 mg by mouth at bedtime.)   sevelamer carbonate (RENVELA)  800 MG tablet Take 800 mg by mouth 3 (three) times daily with meals.   traMADol (ULTRAM) 50 MG tablet Take 1 tablet (50 mg total) by mouth every 6 (six) hours as needed.   No facility-administered encounter medications on file as of 03/01/2021.   Recent Relevant Labs: Lab Results  Component Value Date/Time   HGBA1C 6.7 (H) 11/10/2020 01:48 PM   HGBA1C 6.4 09/02/2020 02:09 PM   HGBA1C 11.5 (H) 10/26/2014 04:36 AM   MICROALBUR 7.3 (H) 03/05/2019 03:26 PM   MICROALBUR 11.9 (H) 10/24/2017 10:50 AM    Kidney Function Lab Results  Component Value Date/Time   CREATININE 6.80 (H) 02/24/2021 06:56 AM   CREATININE 3.99 (H) 12/03/2020 01:26 PM   CREATININE 1.82 (H) 01/27/2017 11:56 AM   CREATININE 2.29 (H) 05/26/2015 03:30 PM   GFR 21.25 (L) 03/30/2020 01:52 PM   GFRNONAA 14 (L) 12/03/2020 01:26 PM   GFRAA 22 (L) 01/31/2020 02:00 PM    Current antihyperglycemic regimen:  Tresiba Flextouch 200 unit/mL - 44 units at bedtime Victoza 18 mg/4m 1.8 mg daily before supper Novolog Flexpen 100 unit/mL 10-12 units before supper  What recent interventions/DTPs have been made to improve glycemic control:  No recent interventions or DTPs.  Have there been any recent hospitalizations or ED visits since last visit with CPP? Yes  Patient denies hypoglycemic symptoms.  Patient denies hyperglycemic symptoms.  How often are you checking your blood sugar? 3-4 times daily  What are your blood sugars ranging?  Fasting: 67, 100, 115, 83 Before meals: n/a After meals: n/a Bedtime: n/a  During the week, how often does your blood glucose drop below 70? Twice a week - Patient wife states she lowered his Tresiba to 36 units at night which has help his low blood sugars tremendously. She states the patient does not have much of an appetite on the days he had dialysis.  Are you checking your feet daily/regularly? Patients wife states she checks the patients feet daily.  Adherence Review: Is the patient  currently on a STATIN medication? Yes Is the patient currently on ACE/ARB medication? No Does the patient have >5 day gap between last estimated fill dates? No   Care Gaps: Medicare Annual Wellness: Completed Ophthalmology Exam: Next due on 07/13/2021 Foot Exam: Overdue since 12/30/2020 Hemoglobin A1C: 6.7% on 11/10/2020 Colonoscopy: Aged Out  Future Appointments  Date Time Provider [place] 03/03/2021  7:00 AM [facility] DEVICE REMOTES [facility]  03/30/2021  2:00 PM [facility] HS VASC 1 - HC [facility]  03/30/2021  3:00 PM [facility] PA [facility]VS  04/07/2021  9:30 AM KElayne Snare MD [facility] None  06/02/2021  7:00 AM [facility] DEVICE REMOTES [facility]  07/05/2021  1:30 PM [facility] CCM PHARMACIST [facility] PEC  07/29/2021  2:30 PM [facility] HEALTH COACH [facility]EC  09/01/2021  7:00 AM [facility] DEVICE REMOTES [facility]    Star Rating Drugs: Atorvastatin 10 mg last filled 02/04/2021 90 DS Novolog last filled 02/04/2021 68 DS Tresiba last filled 02/12/2021 30 DS Victoza  last filled 12/22/2020 90 DS  April D Calhoun, [place] Pharmacist Assistant [phone]   6 minutes spent in review, coordination, and documentation.  Reviewed note, ok with change at home will address at next visit with PharmD.  Reviewed by: Beverly Milch, PharmD Clinical Pharmacist [phone]

## 2021-03-02 DIAGNOSIS — N2581 Secondary hyperparathyroidism of renal origin: Secondary | ICD-10-CM | POA: Diagnosis not present

## 2021-03-02 DIAGNOSIS — Z992 Dependence on renal dialysis: Secondary | ICD-10-CM | POA: Diagnosis not present

## 2021-03-02 DIAGNOSIS — E138 Other specified diabetes mellitus with unspecified complications: Secondary | ICD-10-CM | POA: Diagnosis not present

## 2021-03-02 DIAGNOSIS — N186 End stage renal disease: Secondary | ICD-10-CM | POA: Diagnosis not present

## 2021-03-02 DIAGNOSIS — D509 Iron deficiency anemia, unspecified: Secondary | ICD-10-CM | POA: Diagnosis not present

## 2021-03-02 DIAGNOSIS — Z23 Encounter for immunization: Secondary | ICD-10-CM | POA: Diagnosis not present

## 2021-03-02 NOTE — Telephone Encounter (Signed)
Patient's daughter called in today stating she was very aggravated b/c dad is not getting his hospital bed.    States [place] is not getting correct documentation from our office.  States they are missing date of OV on Hospital Bed order.  I reached out to [facility] in [facility].  Spoke with Suanne Marker.  States face to face notes are missing but was not 100% sure.  States the intake team would know.  Stated she would get Stephanie from the intake team to give me a call back.  I called daughter back at [phone] to inform.  Daughter stated she did not want to wait on me to receive a call back from the intake team.  She wanted order to be sent.  States order needs to be dated the date of the OV with Dr. [name] ( 02/25/21).  After reviewing date of order was dated 10/14.  But OV notes from 2021 was attached.  I have faxed order dated 10/14 along with OV notes from 10/13 back to [facility].    I have informed daughter.

## 2021-03-02 NOTE — Telephone Encounter (Signed)
Noted  

## 2021-03-03 ENCOUNTER — Ambulatory Visit (INDEPENDENT_AMBULATORY_CARE_PROVIDER_SITE_OTHER): Payer: Medicare Other

## 2021-03-03 DIAGNOSIS — E1122 Type 2 diabetes mellitus with diabetic chronic kidney disease: Secondary | ICD-10-CM | POA: Diagnosis not present

## 2021-03-03 DIAGNOSIS — I495 Sick sinus syndrome: Secondary | ICD-10-CM

## 2021-03-03 DIAGNOSIS — I120 Hypertensive chronic kidney disease with stage 5 chronic kidney disease or end stage renal disease: Secondary | ICD-10-CM | POA: Diagnosis not present

## 2021-03-03 DIAGNOSIS — I739 Peripheral vascular disease, unspecified: Secondary | ICD-10-CM | POA: Diagnosis not present

## 2021-03-03 DIAGNOSIS — M79642 Pain in left hand: Secondary | ICD-10-CM | POA: Diagnosis not present

## 2021-03-03 DIAGNOSIS — R531 Weakness: Secondary | ICD-10-CM | POA: Diagnosis not present

## 2021-03-03 DIAGNOSIS — E1140 Type 2 diabetes mellitus with diabetic neuropathy, unspecified: Secondary | ICD-10-CM | POA: Diagnosis not present

## 2021-03-04 DIAGNOSIS — N186 End stage renal disease: Secondary | ICD-10-CM | POA: Diagnosis not present

## 2021-03-04 DIAGNOSIS — N2581 Secondary hyperparathyroidism of renal origin: Secondary | ICD-10-CM | POA: Diagnosis not present

## 2021-03-04 DIAGNOSIS — Z23 Encounter for immunization: Secondary | ICD-10-CM | POA: Diagnosis not present

## 2021-03-04 DIAGNOSIS — E138 Other specified diabetes mellitus with unspecified complications: Secondary | ICD-10-CM | POA: Diagnosis not present

## 2021-03-04 DIAGNOSIS — Z992 Dependence on renal dialysis: Secondary | ICD-10-CM | POA: Diagnosis not present

## 2021-03-04 DIAGNOSIS — D509 Iron deficiency anemia, unspecified: Secondary | ICD-10-CM | POA: Diagnosis not present

## 2021-03-05 DIAGNOSIS — I739 Peripheral vascular disease, unspecified: Secondary | ICD-10-CM | POA: Diagnosis not present

## 2021-03-05 DIAGNOSIS — M79642 Pain in left hand: Secondary | ICD-10-CM | POA: Diagnosis not present

## 2021-03-05 DIAGNOSIS — I120 Hypertensive chronic kidney disease with stage 5 chronic kidney disease or end stage renal disease: Secondary | ICD-10-CM | POA: Diagnosis not present

## 2021-03-05 DIAGNOSIS — R531 Weakness: Secondary | ICD-10-CM | POA: Diagnosis not present

## 2021-03-05 DIAGNOSIS — E1140 Type 2 diabetes mellitus with diabetic neuropathy, unspecified: Secondary | ICD-10-CM | POA: Diagnosis not present

## 2021-03-05 DIAGNOSIS — E1122 Type 2 diabetes mellitus with diabetic chronic kidney disease: Secondary | ICD-10-CM | POA: Diagnosis not present

## 2021-03-06 DIAGNOSIS — D509 Iron deficiency anemia, unspecified: Secondary | ICD-10-CM | POA: Diagnosis not present

## 2021-03-06 DIAGNOSIS — N186 End stage renal disease: Secondary | ICD-10-CM | POA: Diagnosis not present

## 2021-03-06 DIAGNOSIS — N2581 Secondary hyperparathyroidism of renal origin: Secondary | ICD-10-CM | POA: Diagnosis not present

## 2021-03-06 DIAGNOSIS — Z23 Encounter for immunization: Secondary | ICD-10-CM | POA: Diagnosis not present

## 2021-03-06 DIAGNOSIS — Z992 Dependence on renal dialysis: Secondary | ICD-10-CM | POA: Diagnosis not present

## 2021-03-06 DIAGNOSIS — E138 Other specified diabetes mellitus with unspecified complications: Secondary | ICD-10-CM | POA: Diagnosis not present

## 2021-03-07 LAB — CUP PACEART REMOTE DEVICE CHECK
Battery Remaining Longevity: 29 mo
Battery Voltage: 2.95 V
Brady Statistic RA Percent Paced: 0.01 %
Brady Statistic RV Percent Paced: 19.05 %
Date Time Interrogation Session: 20221019112644
Implantable Lead Implant Date: 20091102
Implantable Lead Implant Date: 20091102
Implantable Lead Location: 753859
Implantable Lead Location: 753860
Implantable Lead Model: 5076
Implantable Lead Model: 5076
Implantable Pulse Generator Implant Date: 20170118
Lead Channel Impedance Value: 1501 Ohm
Lead Channel Impedance Value: 437 Ohm
Lead Channel Impedance Value: 475 Ohm
Lead Channel Impedance Value: 513 Ohm
Lead Channel Pacing Threshold Amplitude: 1 V
Lead Channel Pacing Threshold Amplitude: 1.625 V
Lead Channel Pacing Threshold Pulse Width: 0.4 ms
Lead Channel Pacing Threshold Pulse Width: 0.4 ms
Lead Channel Sensing Intrinsic Amplitude: 11.75 mV
Lead Channel Sensing Intrinsic Amplitude: 11.75 mV
Lead Channel Sensing Intrinsic Amplitude: 2.875 mV
Lead Channel Sensing Intrinsic Amplitude: 2.875 mV
Lead Channel Setting Pacing Amplitude: 2.5 V
Lead Channel Setting Pacing Amplitude: 2.5 V
Lead Channel Setting Pacing Pulse Width: 0.4 ms
Lead Channel Setting Sensing Sensitivity: 2.8 mV

## 2021-03-08 ENCOUNTER — Telehealth: Payer: Self-pay

## 2021-03-08 NOTE — Progress Notes (Signed)
Remote pacemaker transmission.   

## 2021-03-08 NOTE — Telephone Encounter (Signed)
Scheduled remote reviewed. Normal device function.  Persistent AF since 11/19/2020, missed transmission on 12/01/2020 according to phone note.  No OAC, sent to triage. Next remote 91 days.  Routed to Dr. Caryl Comes for review and recommendations.

## 2021-03-09 DIAGNOSIS — Z992 Dependence on renal dialysis: Secondary | ICD-10-CM | POA: Diagnosis not present

## 2021-03-09 DIAGNOSIS — D509 Iron deficiency anemia, unspecified: Secondary | ICD-10-CM | POA: Diagnosis not present

## 2021-03-09 DIAGNOSIS — N2581 Secondary hyperparathyroidism of renal origin: Secondary | ICD-10-CM | POA: Diagnosis not present

## 2021-03-09 DIAGNOSIS — Z23 Encounter for immunization: Secondary | ICD-10-CM | POA: Diagnosis not present

## 2021-03-09 DIAGNOSIS — N186 End stage renal disease: Secondary | ICD-10-CM | POA: Diagnosis not present

## 2021-03-09 DIAGNOSIS — E138 Other specified diabetes mellitus with unspecified complications: Secondary | ICD-10-CM | POA: Diagnosis not present

## 2021-03-11 DIAGNOSIS — N2581 Secondary hyperparathyroidism of renal origin: Secondary | ICD-10-CM | POA: Diagnosis not present

## 2021-03-11 DIAGNOSIS — Z23 Encounter for immunization: Secondary | ICD-10-CM | POA: Diagnosis not present

## 2021-03-11 DIAGNOSIS — Z992 Dependence on renal dialysis: Secondary | ICD-10-CM | POA: Diagnosis not present

## 2021-03-11 DIAGNOSIS — E138 Other specified diabetes mellitus with unspecified complications: Secondary | ICD-10-CM | POA: Diagnosis not present

## 2021-03-11 DIAGNOSIS — N186 End stage renal disease: Secondary | ICD-10-CM | POA: Diagnosis not present

## 2021-03-11 DIAGNOSIS — D509 Iron deficiency anemia, unspecified: Secondary | ICD-10-CM | POA: Diagnosis not present

## 2021-03-12 NOTE — Telephone Encounter (Signed)
Reached out to patient wife schedule appoint 03/15/21 at 3:30pm Roderic Palau

## 2021-03-13 DIAGNOSIS — Z23 Encounter for immunization: Secondary | ICD-10-CM | POA: Diagnosis not present

## 2021-03-13 DIAGNOSIS — Z992 Dependence on renal dialysis: Secondary | ICD-10-CM | POA: Diagnosis not present

## 2021-03-13 DIAGNOSIS — N186 End stage renal disease: Secondary | ICD-10-CM | POA: Diagnosis not present

## 2021-03-13 DIAGNOSIS — N2581 Secondary hyperparathyroidism of renal origin: Secondary | ICD-10-CM | POA: Diagnosis not present

## 2021-03-13 DIAGNOSIS — E138 Other specified diabetes mellitus with unspecified complications: Secondary | ICD-10-CM | POA: Diagnosis not present

## 2021-03-13 DIAGNOSIS — D509 Iron deficiency anemia, unspecified: Secondary | ICD-10-CM | POA: Diagnosis not present

## 2021-03-15 ENCOUNTER — Other Ambulatory Visit: Payer: Self-pay

## 2021-03-15 ENCOUNTER — Ambulatory Visit (HOSPITAL_COMMUNITY)
Admission: RE | Admit: 2021-03-15 | Discharge: 2021-03-15 | Disposition: A | Discharge status: Home / Self Care | Payer: Medicare Other | Source: Ambulatory Visit | Attending: Nurse Practitioner | Admitting: Nurse Practitioner

## 2021-03-15 ENCOUNTER — Encounter (HOSPITAL_COMMUNITY): Payer: Self-pay | Admitting: Nurse Practitioner

## 2021-03-15 VITALS — BP 98/74 | HR 103 | Ht 76.0 in | Wt 245.2 lb

## 2021-03-15 DIAGNOSIS — I4819 Other persistent atrial fibrillation: Secondary | ICD-10-CM | POA: Insufficient documentation

## 2021-03-15 DIAGNOSIS — N184 Chronic kidney disease, stage 4 (severe): Secondary | ICD-10-CM

## 2021-03-15 DIAGNOSIS — Z8673 Personal history of transient ischemic attack (TIA), and cerebral infarction without residual deficits: Secondary | ICD-10-CM | POA: Insufficient documentation

## 2021-03-15 DIAGNOSIS — I2510 Atherosclerotic heart disease of native coronary artery without angina pectoris: Secondary | ICD-10-CM | POA: Insufficient documentation

## 2021-03-15 DIAGNOSIS — I129 Hypertensive chronic kidney disease with stage 1 through stage 4 chronic kidney disease, or unspecified chronic kidney disease: Secondary | ICD-10-CM | POA: Diagnosis not present

## 2021-03-15 DIAGNOSIS — I495 Sick sinus syndrome: Secondary | ICD-10-CM | POA: Insufficient documentation

## 2021-03-15 DIAGNOSIS — Z950 Presence of cardiac pacemaker: Secondary | ICD-10-CM | POA: Diagnosis not present

## 2021-03-15 DIAGNOSIS — Z794 Long term (current) use of insulin: Secondary | ICD-10-CM | POA: Insufficient documentation

## 2021-03-15 DIAGNOSIS — I421 Obstructive hypertrophic cardiomyopathy: Secondary | ICD-10-CM | POA: Diagnosis not present

## 2021-03-15 DIAGNOSIS — N189 Chronic kidney disease, unspecified: Secondary | ICD-10-CM | POA: Insufficient documentation

## 2021-03-15 DIAGNOSIS — Z79899 Other long term (current) drug therapy: Secondary | ICD-10-CM | POA: Insufficient documentation

## 2021-03-15 DIAGNOSIS — Z9989 Dependence on other enabling machines and devices: Secondary | ICD-10-CM | POA: Insufficient documentation

## 2021-03-15 DIAGNOSIS — G4733 Obstructive sleep apnea (adult) (pediatric): Secondary | ICD-10-CM | POA: Diagnosis not present

## 2021-03-15 DIAGNOSIS — Z7985 Long-term (current) use of injectable non-insulin antidiabetic drugs: Secondary | ICD-10-CM | POA: Diagnosis not present

## 2021-03-15 DIAGNOSIS — Z87891 Personal history of nicotine dependence: Secondary | ICD-10-CM | POA: Diagnosis not present

## 2021-03-15 DIAGNOSIS — Z992 Dependence on renal dialysis: Secondary | ICD-10-CM | POA: Insufficient documentation

## 2021-03-15 DIAGNOSIS — Z7902 Long term (current) use of antithrombotics/antiplatelets: Secondary | ICD-10-CM | POA: Diagnosis not present

## 2021-03-15 DIAGNOSIS — Z7901 Long term (current) use of anticoagulants: Secondary | ICD-10-CM | POA: Diagnosis not present

## 2021-03-15 MED ORDER — APIXABAN 2.5 MG PO TABS: 2.5000 mg | ORAL_TABLET | Freq: Two times a day (BID) | ORAL | 6 refills | Status: DC

## 2021-03-15 NOTE — Progress Notes (Signed)
Primary Care Physician: [name], MD Referring Physician: Dr. Vevelyn Pat David [name] is a 80 y.o. 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 He was recently  found to be in persistent afib since July 2022 by device clinic. EKG shows atrial flutter at 103 bpm, Soft BP limits up titration of BB. He has noted that some days he does not feel as well. He has been on plavix since his stroke in 2019. No bleeding issues. Dr. Caryl Comes wanted me to address  change in anticoagulation since he now is in persistent  afib. I did reach out to Frann Rider, NP and she is was ok to stop plavix for favor of starting eliquis 2.5 mg bid.   Today, he denies symptoms of palpitations, chest pain, shortness of breath, orthopnea, PND, lower extremity edema, dizziness, presyncope, syncope, or neurologic sequela. The patient is tolerating medications without difficulties and is otherwise without complaint today.   Past Medical History:  Diagnosis Date   Acute cholecystitis s/p lap cholecystectomy 03/05/2017 03/04/2017   Anemia, iron deficiency    Anxiety    Arthritis    Atrial fibrillation with rapid ventricular response (HCC)    BPH (benign prostatic hypertrophy)    CAD (coronary artery disease)    Nonobstructive CAD per cath   Cancer [facility])    Cardiac pacemaker in situ    CHF (congestive heart failure) (HCC)    Chronic ulcer of right foot ([place])    resolved per patient   CKD (chronic kidney disease) stage 4, GFR 15-29 ml/min ([place]) 09/26/2017   CKD (chronic kidney disease), stage III (HCC) secondary to DM and HTN   nephrologist-  [name]   Coronary artery disease involving native coronary artery of native heart without angina pectoris    Depression    Diastolic dysfunction    Dyspnea    with exertion, no oxygen   Dysrhythmia    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 ([place]) 09/04/2017   Hearing loss     wears hearing aids   History of cellulitis    right great toe 10-25-2014   History of skin cancer    HOCM (hypertrophic obstructive cardiomyopathy) (HCC)    HOH (hard of hearing)    Hyperlipidemia associated with type 2 diabetes mellitus ([place]) 10/25/2013   Hypertension    Hypertension associated with diabetes ([place]) 10/25/2013   IDDM (insulin dependent diabetes mellitus) - on insulin pump 03/04/2017   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   followd by [name]--  has insulin pump   Insulin pump in place    Patient does not have insulin pump - removed approx 2 yrs ago 2019   Memory loss    mild per patient   OSA on CPAP    Pacemaker 06/03/2015   Medtronic   Peripheral neuropathy    severe - feet/lower legs   Peripheral vascular disease (HCC)    bilateral lower extremities   PVD (peripheral vascular disease) ([place])    Rib fracture 07/24/2015   Seasonal and perennial allergic rhinitis 10/25/2013   Seborrheic keratosis 09/19/2016   Secondary hyperparathyroidism of renal origin ([place])    Sinus node arrhythmia 06/03/2015   Sinus node dysfunction ([place])    Sleep apnea    Syncope 07/24/2015   Walker as ambulation aid    Past Surgical History:  Procedure Laterality Date   AMPUTATION  OF REPLICATED TOES  Mar 6606   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)   AMPUTATION TOE Right 03/12/2015   Procedure: RIGHT HALLUS AMPUTATION ;  Surgeon: [name], MD;  Location: [facility];  Service: Podiatry;  Laterality: Right;   AV FISTULA PLACEMENT Left 04/16/2020   Procedure: CREATION OF LEFT Brachial/Cephalic Arteriovenous Fistula.;  Surgeon: Cherre Robins, MD;  Location: [facility];  Service: Vascular;  Laterality: Left;   [place] Left 08/06/2020   Procedure: BRANCH LIGATION OF LEFT UPPER EXTREMITY ARTERIOVENOUS FISTULA;  Surgeon: Cherre Robins, MD;  Location: [facility];  Service: Vascular;  Laterality: Left;   CARDIAC CATHETERIZATION  11-25-2010   [place],  [place]   Nonobstructive CAD   CARDIAC PACEMAKER PLACEMENT  Nov 2009   Medtronic   CHOLECYSTECTOMY N/A 03/05/2017   Procedure: LAPAROSCOPIC CHOLECYSTECTOMY WITH INTRAOPERATIVE CHOLANGIOGRAM;  Surgeon: [name], MD;  Location: [facility];  Service: General;  Laterality: N/A;   EP IMPLANTABLE DEVICE N/A 06/03/2015   Procedure: PPM Generator Changeout;  Surgeon: [name], MD;  Location: [facility];  Service: Cardiovascular;  Laterality: N/A;   EXCISION BONE CYST Right 03/06/2015   Procedure: BONE BIOPSIES OF RIGHT FOOT;  Surgeon: [name], MD;  Location: [facility];  Service: Podiatry;  Laterality: Right;   EYE SURGERY Bilateral    KNEE ARTHROSCOPY     ORIF ANKLE FRACTURE Left 11/06/2014   Procedure: OPEN REDUCTION INTERNAL FIXATION (ORIF) LEFT  ANKLE FRACTURE;  Surgeon: [name], MD;  Location: [facility];  Service: Orthopedics;  Laterality: Left;   REVISON OF ARTERIOVENOUS FISTULA Left 02/24/2021   Procedure: REVISION OF LEFT ARTERIOVENOUS FISTULA with INTERPOSITION GRAFT USING ARTEGRAFT COLLAGEN GRAFT;  Surgeon: Cherre Robins, MD;  Location: [facility];  Service: Vascular;  Laterality: Left;   TEE WITHOUT CARDIOVERSION N/A 09/01/2017   Procedure: TRANSESOPHAGEAL ECHOCARDIOGRAM (TEE);  Surgeon: [name], MD;  Location: [facility];  Service: Cardiovascular;  Laterality: N/A;   TOTAL KNEE ARTHROPLASTY     patient denies this surgery   VEIN LIGATION AND STRIPPING      Current Outpatient Medications  Medication Sig Dispense Refill   acetaminophen (TYLENOL) 500 MG tablet Take 1,000 mg by mouth 2 (two) times daily as needed (pain.).     allopurinol (ZYLOPRIM) 100 MG tablet Take 1 tablet (100 mg total) by mouth daily. 90 tablet 3   amLODipine (NORVASC) 5 MG tablet TAKE 1 TABLET BY MOUTH EVERY DAY 90 tablet 1   apixaban (ELIQUIS) 2.5 MG TABS tablet Take 1 tablet (2.5 mg total) by mouth 2 (two) times daily. 60 tablet 6   atorvastatin (LIPITOR)  10 MG tablet Take 1 tablet (10 mg total) by mouth every evening. 90 tablet 1   blood glucose meter kit and supplies KIT Dispense based on patient and insurance preference. Use up to four times daily as directed. (FOR ICD-9 250.00, 250.01). 1 each 0   calcitRIOL (ROCALTROL) 0.25 MCG capsule Take 1 capsule (0.25 mcg total) by mouth daily. 90 capsule 0   cetirizine (ZYRTEC) 10 MG tablet Take 10 mg by mouth daily.     clopidogrel (PLAVIX) 75 MG tablet Take 1 tablet (75 mg total) by mouth daily. Restart on 02/25/21 90 tablet 3   Darbepoetin Alfa (ARANESP, ALBUMIN FREE,) 60 MCG/ML SOLN Inject 60 mcg as directed every 30 (thirty) days.     Epoetin Alfa (EPOGEN IJ) Inject as directed See admin instructions. Every 4 weeks  folic acid (FOLVITE) 1 MG tablet Take 1 mg by mouth in the morning.     folic acid-vitamin b complex-vitamin c-selenium-zinc (DIALYVITE) 3 MG TABS tablet Take 1 tablet by mouth every evening.     glucose blood test strip Use Contour test strips as instructed to check blood sugar four times daily. 200 each 2   hydrALAZINE (APRESOLINE) 100 MG tablet Take 1 tablet (100 mg total) by mouth 2 (two) times daily. 180 tablet 0   insulin aspart (NOVOLOG FLEXPEN) 100 UNIT/ML FlexPen Inject 10-12 Units into the skin See admin instructions. Inject 10 units subcutaneously before breakfast and 12 units before supper (Patient taking differently: Inject 8 Units into the skin in the morning and at bedtime.) 15 mL 5   insulin degludec (TRESIBA FLEXTOUCH) 200 UNIT/ML FlexTouch Pen INJECT 44 UNITS SUBCUTANEOUSLY ONCE DAILY (Patient taking differently: Inject 36 Units into the skin every evening.) 9 mL 2   Insulin Syringe-Needle U-100 (INSULIN SYRINGE 1CC/31GX5/16") 31G X 5/16" 1 ML MISC 1 each by Does not apply route 3 (three) times daily. Use insulin syringe to inject insulin three times daily. 100 each 2   isosorbide mononitrate (IMDUR) 30 MG 24 hr tablet TAKE 2 TABLETS DAILY. (Patient taking differently:  Take 60 mg by mouth every evening. TAKE 2 TABLETS DAILY.) 180 tablet 3   liraglutide (VICTOZA) 18 MG/3ML SOPN Inject 1.8 mg into the skin daily before supper. (Patient taking differently: Inject 1.2 mg into the skin daily before supper.) 3 mL 1   Omega-3 Fatty Acids (FISH OIL) 1000 MG CAPS Take 1 capsule (1,000 mg total) by mouth every morning. 30 capsule 1   pantoprazole (PROTONIX) 40 MG tablet Take 1 tablet (40 mg total) by mouth daily. 90 tablet 0   sertraline (ZOLOFT) 100 MG tablet Take 1 tablet (100 mg total) by mouth daily. 90 tablet 1   sevelamer carbonate (RENVELA) 800 MG tablet Take 800 mg by mouth 3 (three) times daily with meals.     carvedilol (COREG) 12.5 MG tablet Take 12.5 mg by mouth 2 (two) times daily.     No current facility-administered medications for this encounter.    Allergies  Allergen Reactions   Ativan [Lorazepam] Anxiety and Other (See Comments)    Pt gets more agitated   Adhesive [Tape] Other (See Comments)    blisters   Niacin Rash    Social History   Socioeconomic History   Marital status: Married    Spouse name: Not on file   Number of children: 3   Years of education: 12   Highest education level: Not on file  Occupational History   Occupation: Retired  Tobacco Use   Smoking status: Former    Packs/day: 2.00    Years: 30.00    Pack years: 60.00    Types: Cigarettes    Quit date: 03/03/1984    Years since quitting: 37.0   Smokeless tobacco: Never   Tobacco comments:    Former smoker 03/15/2021  Vaping Use   Vaping Use: Never used  Substance and Sexual Activity   Alcohol use: Not Currently    Comment: 1  beer a month   Drug use: No   Sexual activity: Not Currently  Other Topics Concern   Not on file  Social History Narrative   Currently resides with his wife. 1 dog. Fun/Hobby: Golf    Denies any religious beliefs effecting health care.    Social Determinants of Health   Financial Resource Strain: Low Risk  Difficulty of Paying  Living Expenses: Not hard at all  Food Insecurity: No Food Insecurity   Worried About [place] in the Last Year: Never true   Ran Out of Food in the Last Year: Never true  Transportation Needs: No Transportation Needs   Lack of Transportation (Medical): No   Lack of Transportation (Non-Medical): No  Physical Activity: Insufficiently Active   Days of Exercise per Week: 3 days   Minutes of Exercise per Session: 20 min  Stress: No Stress Concern Present   Feeling of Stress : Not at all  Social Connections: Socially Isolated   Frequency of Communication with Friends and Family: Never   Frequency of Social Gatherings with Friends and Family: Once a week   Attends Religious Services: Never   Marine scientist or Organizations: No   Attends Archivist Meetings: Never   Marital Status: Married  Human resources officer Violence: Not At Risk   Fear of Current or Ex-Partner: No   Emotionally Abused: No   Physically Abused: No   Sexually Abused: No    Family History  Problem Relation Age of Onset   Cancer Mother        breast   Heart attack Father    Stroke Father     ROS- All systems are reviewed and negative except as per the HPI above  Physical Exam: Vitals:   03/15/21 1522  BP: 98/74  Pulse: (!) 103  Weight: 111.2 kg  Height: _0  (1.93 m)   Wt Readings from Last 3 Encounters:  03/15/21 111.2 kg  02/25/21 108.9 kg  02/24/21 123.8 kg    Labs: Lab Results  Component Value Date   NA 136 02/24/2021   K 4.7 02/24/2021   CL 98 02/24/2021   CO2 20 (L) 12/03/2020   GLUCOSE 107 (H) 02/24/2021   BUN 35 (H) 02/24/2021   CREATININE 6.80 (H) 02/24/2021   CALCIUM 9.0 12/03/2020   PHOS 4.8 (H) 12/03/2020   MG 2.2 01/12/2019   Lab Results  Component Value Date   INR 1.0 01/05/2019   Lab Results  Component Value Date   CHOL 86 11/10/2020   HDL 21.00 (L) 11/10/2020   LDLCALC 38 11/10/2020   TRIG 137.0 11/10/2020     GEN- The patient is well  appearing, alert and oriented x 3 today.   Head- normocephalic, atraumatic Eyes-  Sclera clear, conjunctiva pink Ears- hearing intact Oropharynx- clear Neck- supple, no JVP Lymph- no cervical lymphadenopathy Lungs- Clear to ausculation bilaterally, normal work of breathing Heart- Regular rate and rhythm, no murmurs, rubs or gallops, PMI not laterally displaced GI- soft, NT, ND, + BS Extremities- no clubbing, cyanosis, or edema MS- no significant deformity or atrophy Skin- no rash or lesion Psych- euthymic mood, full affect Neuro- strength and sensation are intact  EKG- aflutter at 103 bpm, qrs int 122 ms, qtc 526 ms   Echo- 1. Left ventricular ejection fraction, by estimation, is 55 to 60%. The  left ventricle has normal function. The left ventricle has no regional  wall motion abnormalities. There is moderate concentric left ventricular  hypertrophy. Left ventricular  diastolic parameters are consistent with Grade II diastolic dysfunction  (pseudonormalization). Elevated left atrial pressure.   2. Right ventricular systolic function is normal. The right ventricular  size is normal. Mildly increased right ventricular wall thickness.  Tricuspid regurgitation signal is inadequate for assessing PA pressure.   3. Left atrial size was mildly dilated.   4.  A small pericardial effusion is present. The pericardial effusion is  circumferential. There is no evidence of cardiac tamponade.   5. The mitral valve is normal in structure. No evidence of mitral valve  regurgitation.   6. 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 There is mild calcification of the  aortic valve. There is moderate thickening of the aortic valve. Aortic  valve regurgitation is trivial. Mild to moderate aortic valve  sclerosis/calcification is present, without any  evidence of aortic stenosis.   7. Aortic dilatation noted. There is mild dilatation of the aortic root,  measuring 41 mm.   8. The inferior vena cava is dilated in  size with >50% respiratory  variability, suggesting right atrial pressure of 8 mmHg.  Assessment and Plan:  1. Persistent afib  Present since July 2022 Rate control is acceptable  Cannot increase BB for better rate control as he has soft BP's Continue carvedilol 12.5 mg bid   2. CHA2DS2VASc  score of 8 Discussed with Frann Rider, NP and ok to stop plavix(started at time of stroke 4 years ago)  and will start Eliquis 2.5 mg bid (creatinine over 1.5 and age over 65 ) Bleeding precautions discussed   3. CKD Per dialysis  4. HTN Soft BP   5. OSA Uses CPAP  F/u in 2 weeks Will discuss later possibility of pursuing cardioversion   Butch Penny C. Malakhai Beitler, [facility] [address] [place], [place] [address] [phone]

## 2021-03-15 NOTE — Patient Instructions (Signed)
Start Eliquis 2.5mg - Taking one tablet by mouth twice daily once we contact you back regarding your Plavix medication.

## 2021-03-16 ENCOUNTER — Other Ambulatory Visit (HOSPITAL_COMMUNITY): Payer: Self-pay

## 2021-03-16 DIAGNOSIS — E138 Other specified diabetes mellitus with unspecified complications: Secondary | ICD-10-CM | POA: Diagnosis not present

## 2021-03-16 DIAGNOSIS — N2581 Secondary hyperparathyroidism of renal origin: Secondary | ICD-10-CM | POA: Diagnosis not present

## 2021-03-16 DIAGNOSIS — D631 Anemia in chronic kidney disease: Secondary | ICD-10-CM | POA: Diagnosis not present

## 2021-03-16 DIAGNOSIS — Z23 Encounter for immunization: Secondary | ICD-10-CM | POA: Diagnosis not present

## 2021-03-16 DIAGNOSIS — N186 End stage renal disease: Secondary | ICD-10-CM | POA: Diagnosis not present

## 2021-03-16 DIAGNOSIS — Z992 Dependence on renal dialysis: Secondary | ICD-10-CM | POA: Diagnosis not present

## 2021-03-16 DIAGNOSIS — D509 Iron deficiency anemia, unspecified: Secondary | ICD-10-CM | POA: Diagnosis not present

## 2021-03-16 DIAGNOSIS — E1129 Type 2 diabetes mellitus with other diabetic kidney complication: Secondary | ICD-10-CM | POA: Diagnosis not present

## 2021-03-16 MED ORDER — APIXABAN 2.5 MG PO TABS: 2.5000 mg | ORAL_TABLET | Freq: Two times a day (BID) | ORAL | 1 refills | Status: AC

## 2021-03-18 DIAGNOSIS — Z23 Encounter for immunization: Secondary | ICD-10-CM | POA: Diagnosis not present

## 2021-03-18 DIAGNOSIS — N186 End stage renal disease: Secondary | ICD-10-CM | POA: Diagnosis not present

## 2021-03-18 DIAGNOSIS — D509 Iron deficiency anemia, unspecified: Secondary | ICD-10-CM | POA: Diagnosis not present

## 2021-03-18 DIAGNOSIS — E138 Other specified diabetes mellitus with unspecified complications: Secondary | ICD-10-CM | POA: Diagnosis not present

## 2021-03-18 DIAGNOSIS — Z992 Dependence on renal dialysis: Secondary | ICD-10-CM | POA: Diagnosis not present

## 2021-03-18 DIAGNOSIS — N2581 Secondary hyperparathyroidism of renal origin: Secondary | ICD-10-CM | POA: Diagnosis not present

## 2021-03-19 ENCOUNTER — Other Ambulatory Visit: Payer: Self-pay

## 2021-03-19 DIAGNOSIS — N186 End stage renal disease: Secondary | ICD-10-CM

## 2021-03-20 DIAGNOSIS — N2581 Secondary hyperparathyroidism of renal origin: Secondary | ICD-10-CM | POA: Diagnosis not present

## 2021-03-20 DIAGNOSIS — D509 Iron deficiency anemia, unspecified: Secondary | ICD-10-CM | POA: Diagnosis not present

## 2021-03-20 DIAGNOSIS — Z992 Dependence on renal dialysis: Secondary | ICD-10-CM | POA: Diagnosis not present

## 2021-03-20 DIAGNOSIS — Z23 Encounter for immunization: Secondary | ICD-10-CM | POA: Diagnosis not present

## 2021-03-20 DIAGNOSIS — N186 End stage renal disease: Secondary | ICD-10-CM | POA: Diagnosis not present

## 2021-03-20 DIAGNOSIS — E138 Other specified diabetes mellitus with unspecified complications: Secondary | ICD-10-CM | POA: Diagnosis not present

## 2021-03-23 DIAGNOSIS — D509 Iron deficiency anemia, unspecified: Secondary | ICD-10-CM | POA: Diagnosis not present

## 2021-03-23 DIAGNOSIS — N2581 Secondary hyperparathyroidism of renal origin: Secondary | ICD-10-CM | POA: Diagnosis not present

## 2021-03-23 DIAGNOSIS — N186 End stage renal disease: Secondary | ICD-10-CM | POA: Diagnosis not present

## 2021-03-23 DIAGNOSIS — Z23 Encounter for immunization: Secondary | ICD-10-CM | POA: Diagnosis not present

## 2021-03-23 DIAGNOSIS — E138 Other specified diabetes mellitus with unspecified complications: Secondary | ICD-10-CM | POA: Diagnosis not present

## 2021-03-23 DIAGNOSIS — Z992 Dependence on renal dialysis: Secondary | ICD-10-CM | POA: Diagnosis not present

## 2021-03-25 ENCOUNTER — Telehealth: Payer: Self-pay

## 2021-03-25 NOTE — Telephone Encounter (Signed)
Returned call to wife.  Wife reports patient was unable to go to dialysis this morning due to lethargy and overall weakness.  Wife checked his blood sugar and it was "55".  Patient ate breakfast and blood sugar stabilized and patient was more alert and doing ok at time of call. Encouraged wife to monitor blood glucose levels and let us know if there are changes or concerns. Patient scheduled to be seen by palliative care clinical team tomorrow morning. Wife is good with this plan.  Discussed urgent needs that would require calling 911 such as severe SOB, Chest pain, unresponsiveness or inability to stabilize blood sugar. Updated palliative clinical team.  Patient scheduled for dialysis this Saturday and wife is monitoring fluid intake.

## 2021-03-26 ENCOUNTER — Other Ambulatory Visit: Payer: Self-pay

## 2021-03-26 ENCOUNTER — Other Ambulatory Visit: Payer: Medicare Other

## 2021-03-26 DIAGNOSIS — Z515 Encounter for palliative care: Secondary | ICD-10-CM

## 2021-03-26 NOTE — Progress Notes (Signed)
COMMUNITY PALLIATIVE CARE SW NOTE  PATIENT NAME: David [name] DOB: [birth year] MRN: [id number]  PRIMARY CARE PROVIDER: [name], MD  RESPONSIBLE PARTY:  Acct ID - Guarantor Home Phone Work Phone Relationship Acct Type  [id number] David [name], David [name]  Self P/F     [place], [place], [place] [id number]     PLAN OF CARE and INTERVENTIONS:             GOALS OF CARE/ ADVANCE CARE PLANNING:  Goal is for patient to remain at home. Patient is a full code. SOCIAL/EMOTIONAL/SPIRITUAL ASSESSMENT/ INTERVENTIONS:  SW completed a follow-up visit with patient. Patient's wife called in the past two days advising that patient was having some difficulty with anxiety, weakness, and overall decline where he did not go to dialysis yesterday.  After further assessment it was determined that patient's sugar was low. Patient was found today up in his wheelchair at his computer. Patient report that he is having some pain to his shoulders and knees that is being managed with Tylenol and occasionally Tramadol. Patient is eating two good meals a day that is usually brunch and dinner. Patient went to the Afib clinic after the transmission of his pace maker didn't go through. It is determined that patient has been in Afib since July and was placed on Plavix and Eliquis for three weeks. Patient is will [facility] on 12/16 to assess his progress and and to determine if additional interventions are needed. SW assessed patient's anxiety and emotional status. He stated that overall he is doing better, but does worry about his mortality. SW normalized those feelings, while encouraging patient to take his illness one day at a time. SW offered to assist through counseling, connecting him with a support group or a veteran's organization and patient decline. He indicated that he has enough support through his family and was content with that. SW will continue to extend support to patient.  Patient and family remain  open to ongoing support and visits by the palliative care team.  PATIENT/CAREGIVER EDUCATION/ COPING:  Patient and his wife appear to have coping well. They have strong family support. PERSONAL EMERGENCY PLAN:  911 can be activated for emergencies. COMMUNITY RESOURCES COORDINATION/ HEALTH CARE NAVIGATION:  Patient goes to dialysis 3x/ week. FINANCIAL/LEGAL CONCERNS/INTERVENTIONS:  None     SOCIAL HX:  Social History   Tobacco Use   Smoking status: Former    Packs/day: 2.00    Years: 30.00    Pack years: 60.00    Types: Cigarettes    Quit date: 03/03/1984    Years since quitting: 37.0   Smokeless tobacco: Never   Tobacco comments:    Former smoker 03/15/2021  Substance Use Topics   Alcohol use: Not Currently    Comment: 1  beer a month    CODE STATUS: Full Code ADVANCED DIRECTIVES: No MOST FORM COMPLETE:  No HOSPICE EDUCATION PROVIDED: No  PPS: Patient is alert and oriented x3. He is independent for personal care needs.      Duration of visit and documentation: 60 minutes.      [address] [place], [place]

## 2021-03-27 DIAGNOSIS — E138 Other specified diabetes mellitus with unspecified complications: Secondary | ICD-10-CM | POA: Diagnosis not present

## 2021-03-27 DIAGNOSIS — Z992 Dependence on renal dialysis: Secondary | ICD-10-CM | POA: Diagnosis not present

## 2021-03-27 DIAGNOSIS — N2581 Secondary hyperparathyroidism of renal origin: Secondary | ICD-10-CM | POA: Diagnosis not present

## 2021-03-27 DIAGNOSIS — D509 Iron deficiency anemia, unspecified: Secondary | ICD-10-CM | POA: Diagnosis not present

## 2021-03-27 DIAGNOSIS — Z23 Encounter for immunization: Secondary | ICD-10-CM | POA: Diagnosis not present

## 2021-03-27 DIAGNOSIS — N186 End stage renal disease: Secondary | ICD-10-CM | POA: Diagnosis not present

## 2021-03-30 ENCOUNTER — Ambulatory Visit (HOSPITAL_COMMUNITY)
Admission: RE | Admit: 2021-03-30 | Discharge: 2021-03-30 | Disposition: A | Discharge status: Home / Self Care | Payer: Medicare Other | Source: Ambulatory Visit | Attending: Vascular Surgery | Admitting: Vascular Surgery

## 2021-03-30 ENCOUNTER — Other Ambulatory Visit: Payer: Self-pay

## 2021-03-30 ENCOUNTER — Ambulatory Visit (INDEPENDENT_AMBULATORY_CARE_PROVIDER_SITE_OTHER): Payer: Medicare Other | Admitting: Physician Assistant

## 2021-03-30 VITALS — BP 80/47 | HR 113 | Temp 97.3°F | Resp 18 | Ht 76.0 in | Wt 264.0 lb

## 2021-03-30 DIAGNOSIS — D509 Iron deficiency anemia, unspecified: Secondary | ICD-10-CM | POA: Diagnosis not present

## 2021-03-30 DIAGNOSIS — N186 End stage renal disease: Secondary | ICD-10-CM

## 2021-03-30 DIAGNOSIS — Z992 Dependence on renal dialysis: Secondary | ICD-10-CM

## 2021-03-30 DIAGNOSIS — N2581 Secondary hyperparathyroidism of renal origin: Secondary | ICD-10-CM | POA: Diagnosis not present

## 2021-03-30 DIAGNOSIS — E138 Other specified diabetes mellitus with unspecified complications: Secondary | ICD-10-CM | POA: Diagnosis not present

## 2021-03-30 DIAGNOSIS — Z23 Encounter for immunization: Secondary | ICD-10-CM | POA: Diagnosis not present

## 2021-03-30 NOTE — Progress Notes (Signed)
POST OPERATIVE DIALYSIS ACCESS OFFICE NOTE    CC:  F/u for dialysis access surgery  HPI:  This is a 80 y.o. male who is s/p left upper extremity revision of arteriovenous fistula with interposition bovine carotid graft by Dr. Stanford Breed on 02/24/2021. He had a previous left brachiocephalic arteriovenous fistula for him 04/16/2020.  This was later revised 08/06/2020 with slight branch ligation.  The fistula has failed to be usable at dialysis.  He continues to dialyze through a right internal jugular tunneled dialysis catheter.  He denies hand pain. He has had some fingertip numbness of 1,2, 3 digits since his original procedure. His wife accompanies him today.  Dialysis days:  TTS   Dialysis center:  [facility]  Allergies  Allergen Reactions   Ativan [Lorazepam] Anxiety and Other (See Comments)    Pt gets more agitated   Adhesive [Tape] Other (See Comments)    blisters   Niacin Rash    Current Outpatient Medications  Medication Sig Dispense Refill   acetaminophen (TYLENOL) 500 MG tablet Take 1,000 mg by mouth 2 (two) times daily as needed (pain.).     allopurinol (ZYLOPRIM) 100 MG tablet Take 1 tablet (100 mg total) by mouth daily. 90 tablet 3   amLODipine (NORVASC) 5 MG tablet TAKE 1 TABLET BY MOUTH EVERY DAY 90 tablet 1   apixaban (ELIQUIS) 2.5 MG TABS tablet Take 1 tablet (2.5 mg total) by mouth 2 (two) times daily. 180 tablet 1   atorvastatin (LIPITOR) 10 MG tablet Take 1 tablet (10 mg total) by mouth every evening. 90 tablet 1   blood glucose meter kit and supplies KIT Dispense based on patient and insurance preference. Use up to four times daily as directed. (FOR ICD-9 250.00, 250.01). 1 each 0   calcitRIOL (ROCALTROL) 0.25 MCG capsule Take 1 capsule (0.25 mcg total) by mouth daily. 90 capsule 0   carvedilol (COREG) 12.5 MG tablet Take 12.5 mg by mouth 2 (two) times daily.     cetirizine (ZYRTEC) 10 MG tablet Take 10 mg by mouth daily.     Darbepoetin Alfa (ARANESP, ALBUMIN FREE,)  60 MCG/ML SOLN Inject 60 mcg as directed every 30 (thirty) days.     Epoetin Alfa (EPOGEN IJ) Inject as directed See admin instructions. Every 4 weeks     folic acid (FOLVITE) 1 MG tablet Take 1 mg by mouth in the morning.     folic acid-vitamin b complex-vitamin c-selenium-zinc (DIALYVITE) 3 MG TABS tablet Take 1 tablet by mouth every evening.     glucose blood test strip Use Contour test strips as instructed to check blood sugar four times daily. 200 each 2   hydrALAZINE (APRESOLINE) 100 MG tablet Take 1 tablet (100 mg total) by mouth 2 (two) times daily. 180 tablet 0   insulin aspart (NOVOLOG FLEXPEN) 100 UNIT/ML FlexPen Inject 10-12 Units into the skin See admin instructions. Inject 10 units subcutaneously before breakfast and 12 units before supper (Patient taking differently: Inject 8 Units into the skin in the morning and at bedtime.) 15 mL 5   insulin degludec (TRESIBA FLEXTOUCH) 200 UNIT/ML FlexTouch Pen INJECT 44 UNITS SUBCUTANEOUSLY ONCE DAILY (Patient taking differently: Inject 36 Units into the skin every evening.) 9 mL 2   Insulin Syringe-Needle U-100 (INSULIN SYRINGE 1CC/31GX5/16") 31G X 5/16" 1 ML MISC 1 each by Does not apply route 3 (three) times daily. Use insulin syringe to inject insulin three times daily. 100 each 2   isosorbide mononitrate (IMDUR) 30 MG 24 hr tablet  TAKE 2 TABLETS DAILY. (Patient taking differently: Take 60 mg by mouth every evening. TAKE 2 TABLETS DAILY.) 180 tablet 3   liraglutide (VICTOZA) 18 MG/3ML SOPN Inject 1.8 mg into the skin daily before supper. (Patient taking differently: Inject 1.2 mg into the skin daily before supper.) 3 mL 1   Omega-3 Fatty Acids (FISH OIL) 1000 MG CAPS Take 1 capsule (1,000 mg total) by mouth every morning. 30 capsule 1   pantoprazole (PROTONIX) 40 MG tablet Take 1 tablet (40 mg total) by mouth daily. 90 tablet 0   sertraline (ZOLOFT) 100 MG tablet Take 1 tablet (100 mg total) by mouth daily. 90 tablet 1   sevelamer carbonate  (RENVELA) 800 MG tablet Take 800 mg by mouth 3 (three) times daily with meals.     No current facility-administered medications for this visit.     ROS:  See HPI  BP (!) 80/47 (BP Location: Right Arm, Patient Position: Sitting, Cuff Size: Normal)   Pulse (!) 113   Temp (!) 97.3 F (36.3 C) (Oral)   Resp 18   Ht _0  (1.93 m)   Wt 264 lb (119.7 kg)   SpO2 95%   BMI 32.14 kg/m    Physical Exam:  General appearance: awake, alert in NAD Chest: catheter site without bleeding or erythema. Dressing dry and intact. Respirations: unlabored; no dyspnea at rest (left/right) upper extremity: Hand is warm with 5/5 grip strength. Motor function and sensation intact. Pulsatile bruit in graft. Incision(s): Well approximated. No active bleeding or hematoma.    Dialysis duplex on 03/30/2021 Summary:  Patent brachiocephalic AVF.  No significant stenosis.  No side branches.     *See table(s) above for measurements and observations.     Diagnosing physician: Monica Martinez MD   Assessment/Plan:   -pt does not have evidence of steal syndrome -dialysis duplex today reveals graft to be patent and adequate depth for access -the graft may be used starting immediately  Barbie Banner, PA-C 03/30/2021 2:54 PM [facility] [phone]  Clinic MD:  Dr. Stanford Breed

## 2021-03-31 ENCOUNTER — Ambulatory Visit (HOSPITAL_COMMUNITY): Payer: Medicare Other | Admitting: Nurse Practitioner

## 2021-04-01 DIAGNOSIS — N2581 Secondary hyperparathyroidism of renal origin: Secondary | ICD-10-CM | POA: Diagnosis not present

## 2021-04-01 DIAGNOSIS — N186 End stage renal disease: Secondary | ICD-10-CM | POA: Diagnosis not present

## 2021-04-01 DIAGNOSIS — Z23 Encounter for immunization: Secondary | ICD-10-CM | POA: Diagnosis not present

## 2021-04-01 DIAGNOSIS — E138 Other specified diabetes mellitus with unspecified complications: Secondary | ICD-10-CM | POA: Diagnosis not present

## 2021-04-01 DIAGNOSIS — D509 Iron deficiency anemia, unspecified: Secondary | ICD-10-CM | POA: Diagnosis not present

## 2021-04-01 DIAGNOSIS — Z992 Dependence on renal dialysis: Secondary | ICD-10-CM | POA: Diagnosis not present

## 2021-04-03 DIAGNOSIS — N2581 Secondary hyperparathyroidism of renal origin: Secondary | ICD-10-CM | POA: Diagnosis not present

## 2021-04-03 DIAGNOSIS — E138 Other specified diabetes mellitus with unspecified complications: Secondary | ICD-10-CM | POA: Diagnosis not present

## 2021-04-03 DIAGNOSIS — D509 Iron deficiency anemia, unspecified: Secondary | ICD-10-CM | POA: Diagnosis not present

## 2021-04-03 DIAGNOSIS — N186 End stage renal disease: Secondary | ICD-10-CM | POA: Diagnosis not present

## 2021-04-03 DIAGNOSIS — Z992 Dependence on renal dialysis: Secondary | ICD-10-CM | POA: Diagnosis not present

## 2021-04-03 DIAGNOSIS — Z23 Encounter for immunization: Secondary | ICD-10-CM | POA: Diagnosis not present

## 2021-04-05 ENCOUNTER — Ambulatory Visit (HOSPITAL_COMMUNITY)
Admission: RE | Admit: 2021-04-05 | Discharge: 2021-04-05 | Disposition: A | Discharge status: Home / Self Care | Payer: Medicare Other | Source: Ambulatory Visit | Attending: Nurse Practitioner | Admitting: Nurse Practitioner

## 2021-04-05 VITALS — BP 102/50 | HR 77 | Ht 76.0 in | Wt 248.8 lb

## 2021-04-05 DIAGNOSIS — N184 Chronic kidney disease, stage 4 (severe): Secondary | ICD-10-CM | POA: Insufficient documentation

## 2021-04-05 DIAGNOSIS — I129 Hypertensive chronic kidney disease with stage 1 through stage 4 chronic kidney disease, or unspecified chronic kidney disease: Secondary | ICD-10-CM | POA: Diagnosis not present

## 2021-04-05 DIAGNOSIS — Z7901 Long term (current) use of anticoagulants: Secondary | ICD-10-CM | POA: Insufficient documentation

## 2021-04-05 DIAGNOSIS — E1122 Type 2 diabetes mellitus with diabetic chronic kidney disease: Secondary | ICD-10-CM | POA: Diagnosis not present

## 2021-04-05 DIAGNOSIS — I4819 Other persistent atrial fibrillation: Secondary | ICD-10-CM | POA: Insufficient documentation

## 2021-04-05 DIAGNOSIS — Z794 Long term (current) use of insulin: Secondary | ICD-10-CM | POA: Insufficient documentation

## 2021-04-05 DIAGNOSIS — Z992 Dependence on renal dialysis: Secondary | ICD-10-CM | POA: Insufficient documentation

## 2021-04-05 DIAGNOSIS — I4430 Unspecified atrioventricular block: Secondary | ICD-10-CM | POA: Insufficient documentation

## 2021-04-05 DIAGNOSIS — G4733 Obstructive sleep apnea (adult) (pediatric): Secondary | ICD-10-CM | POA: Insufficient documentation

## 2021-04-05 DIAGNOSIS — D6869 Other thrombophilia: Secondary | ICD-10-CM

## 2021-04-05 DIAGNOSIS — I4892 Unspecified atrial flutter: Secondary | ICD-10-CM | POA: Insufficient documentation

## 2021-04-05 LAB — CBC
HCT: 37.2 % — ABNORMAL LOW (ref 39.0–52.0)
Hemoglobin: 11.5 g/dL — ABNORMAL LOW (ref 13.0–17.0)
MCH: 30.8 pg (ref 26.0–34.0)
MCHC: 30.9 g/dL (ref 30.0–36.0)
MCV: 99.7 fL (ref 80.0–100.0)
Platelets: 202 10*3/uL (ref 150–400)
RBC: 3.73 MIL/uL — ABNORMAL LOW (ref 4.22–5.81)
RDW: 17.7 % — ABNORMAL HIGH (ref 11.5–15.5)
WBC: 9 10*3/uL (ref 4.0–10.5)
nRBC: 0 % (ref 0.0–0.2)

## 2021-04-06 ENCOUNTER — Encounter (HOSPITAL_COMMUNITY): Payer: Self-pay | Admitting: Nurse Practitioner

## 2021-04-06 DIAGNOSIS — D509 Iron deficiency anemia, unspecified: Secondary | ICD-10-CM | POA: Diagnosis not present

## 2021-04-06 DIAGNOSIS — N2581 Secondary hyperparathyroidism of renal origin: Secondary | ICD-10-CM | POA: Diagnosis not present

## 2021-04-06 DIAGNOSIS — N186 End stage renal disease: Secondary | ICD-10-CM | POA: Diagnosis not present

## 2021-04-06 DIAGNOSIS — Z23 Encounter for immunization: Secondary | ICD-10-CM | POA: Diagnosis not present

## 2021-04-06 DIAGNOSIS — Z992 Dependence on renal dialysis: Secondary | ICD-10-CM | POA: Diagnosis not present

## 2021-04-06 DIAGNOSIS — E138 Other specified diabetes mellitus with unspecified complications: Secondary | ICD-10-CM | POA: Diagnosis not present

## 2021-04-06 NOTE — Progress Notes (Signed)
Primary Care Physician: [name], MD Referring Physician: Dr. Vevelyn Pat David [name] is a 80 y.o. 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 He was recently  found to be in persistent afib since July 2022 by device clinic. EKG shows atrial flutter at 103 bpm, Soft BP limits up titration of BB. He has noted that some days he does not feel as well. He has been on plavix since his stroke in 2019. No bleeding issues. Dr. Caryl Comes wanted me to address  change in anticoagulation since he now is in persistent  afib. I did reach out to Frann Rider, NP and she is was ok to stop plavix for favor of starting eliquis 2.5 mg bid.   F/u in afib clinic, 04/06/21. He is now back for f/u after start of eliquis. He has noted some bruising of his arms. He remains in atrial flutter, rate controlled. He is not symptomatic with this.   Today, he denies symptoms of palpitations, chest pain, shortness of breath, orthopnea, PND, lower extremity edema, dizziness, presyncope, syncope, or neurologic sequela. The patient is tolerating medications without difficulties and is otherwise without complaint today.   Past Medical History:  Diagnosis Date   Acute cholecystitis s/p lap cholecystectomy 03/05/2017 03/04/2017   Anemia, iron deficiency    Anxiety    Arthritis    Atrial fibrillation with rapid ventricular response (HCC)    BPH (benign prostatic hypertrophy)    CAD (coronary artery disease)    Nonobstructive CAD per cath   Cancer [facility])    Cardiac pacemaker in situ    CHF (congestive heart failure) (HCC)    Chronic ulcer of right foot ([place])    resolved per patient   CKD (chronic kidney disease) stage 4, GFR 15-29 ml/min ([place]) 09/26/2017   CKD (chronic kidney disease), stage III (HCC) secondary to DM and HTN   nephrologist-  [name]   Coronary artery disease involving native coronary artery of native heart without angina pectoris     Depression    Diastolic dysfunction    Dyspnea    with exertion, no oxygen   Dysrhythmia    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 ([place]) 09/04/2017   Hearing loss    wears hearing aids   History of cellulitis    right great toe 10-25-2014   History of skin cancer    HOCM (hypertrophic obstructive cardiomyopathy) (HCC)    HOH (hard of hearing)    Hyperlipidemia associated with type 2 diabetes mellitus ([place]) 10/25/2013   Hypertension    Hypertension associated with diabetes ([place]) 10/25/2013   IDDM (insulin dependent diabetes mellitus) - on insulin pump 03/04/2017   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   followd by [name]--  has insulin pump   Insulin pump in place    Patient does not have insulin pump - removed approx 2 yrs ago 2019   Memory loss    mild per patient   OSA on CPAP    Pacemaker 06/03/2015   Medtronic   Peripheral neuropathy    severe - feet/lower legs   Peripheral vascular disease (HCC)    bilateral lower extremities   PVD (peripheral vascular disease) ([place])    Rib fracture 07/24/2015   Seasonal and perennial allergic rhinitis 10/25/2013   Seborrheic keratosis 09/19/2016   Secondary hyperparathyroidism of renal origin ([place])    Sinus  node arrhythmia 06/03/2015   Sinus node dysfunction ([place])    Sleep apnea    Syncope 07/24/2015   Walker as ambulation aid    Past Surgical History:  Procedure Laterality Date   AMPUTATION OF REPLICATED TOES  Mar 5449   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)   AMPUTATION TOE Right 03/12/2015   Procedure: RIGHT HALLUS AMPUTATION ;  Surgeon: [name], MD;  Location: [facility];  Service: Podiatry;  Laterality: Right;   AV FISTULA PLACEMENT Left 04/16/2020   Procedure: CREATION OF LEFT Brachial/Cephalic Arteriovenous Fistula.;  Surgeon: Cherre Robins, MD;  Location: [facility];  Service: Vascular;  Laterality: Left;   [place] Left 08/06/2020   Procedure:  BRANCH LIGATION OF LEFT UPPER EXTREMITY ARTERIOVENOUS FISTULA;  Surgeon: Cherre Robins, MD;  Location: [facility];  Service: Vascular;  Laterality: Left;   CARDIAC CATHETERIZATION  11-25-2010   [place], [place]   Nonobstructive CAD   CARDIAC PACEMAKER PLACEMENT  Nov 2009   Medtronic   CHOLECYSTECTOMY N/A 03/05/2017   Procedure: LAPAROSCOPIC CHOLECYSTECTOMY WITH INTRAOPERATIVE CHOLANGIOGRAM;  Surgeon: [name], MD;  Location: [facility];  Service: General;  Laterality: N/A;   EP IMPLANTABLE DEVICE N/A 06/03/2015   Procedure: PPM Generator Changeout;  Surgeon: [name], MD;  Location: [facility];  Service: Cardiovascular;  Laterality: N/A;   EXCISION BONE CYST Right 03/06/2015   Procedure: BONE BIOPSIES OF RIGHT FOOT;  Surgeon: [name], MD;  Location: [facility];  Service: Podiatry;  Laterality: Right;   EYE SURGERY Bilateral    KNEE ARTHROSCOPY     ORIF ANKLE FRACTURE Left 11/06/2014   Procedure: OPEN REDUCTION INTERNAL FIXATION (ORIF) LEFT  ANKLE FRACTURE;  Surgeon: [name], MD;  Location: [facility];  Service: Orthopedics;  Laterality: Left;   REVISON OF ARTERIOVENOUS FISTULA Left 02/24/2021   Procedure: REVISION OF LEFT ARTERIOVENOUS FISTULA with INTERPOSITION GRAFT USING ARTEGRAFT COLLAGEN GRAFT;  Surgeon: Cherre Robins, MD;  Location: [facility];  Service: Vascular;  Laterality: Left;   TEE WITHOUT CARDIOVERSION N/A 09/01/2017   Procedure: TRANSESOPHAGEAL ECHOCARDIOGRAM (TEE);  Surgeon: [name], MD;  Location: [facility];  Service: Cardiovascular;  Laterality: N/A;   TOTAL KNEE ARTHROPLASTY     patient denies this surgery   VEIN LIGATION AND STRIPPING      Current Outpatient Medications  Medication Sig Dispense Refill   acetaminophen (TYLENOL) 500 MG tablet Take 1,000 mg by mouth 2 (two) times daily as needed (pain.).     allopurinol (ZYLOPRIM) 100 MG tablet Take 1 tablet (100 mg total) by mouth daily. 90 tablet 3   apixaban  (ELIQUIS) 2.5 MG TABS tablet Take 1 tablet (2.5 mg total) by mouth 2 (two) times daily. 180 tablet 1   atorvastatin (LIPITOR) 10 MG tablet Take 1 tablet (10 mg total) by mouth every evening. 90 tablet 1   blood glucose meter kit and supplies KIT Dispense based on patient and insurance preference. Use up to four times daily as directed. (FOR ICD-9 250.00, 250.01). 1 each 0   calcitRIOL (ROCALTROL) 0.25 MCG capsule Take 1 capsule (0.25 mcg total) by mouth daily. 90 capsule 0   carvedilol (COREG) 6.25 MG tablet Take 6.25 mg by mouth 2 (two) times daily.     cetirizine (ZYRTEC) 10 MG tablet Take 10 mg by mouth daily.     Darbepoetin Alfa (ARANESP, ALBUMIN FREE,) 60 MCG/ML SOLN Inject 60 mcg as directed every 30 (thirty) days.  Epoetin Alfa (EPOGEN IJ) Inject as directed See admin instructions. Every 4 weeks     folic acid (FOLVITE) 1 MG tablet Take 1 mg by mouth in the morning.     folic acid-vitamin b complex-vitamin c-selenium-zinc (DIALYVITE) 3 MG TABS tablet Take 1 tablet by mouth every evening.     glucose blood test strip Use Contour test strips as instructed to check blood sugar four times daily. 200 each 2   hydrALAZINE (APRESOLINE) 100 MG tablet Take 1 tablet (100 mg total) by mouth 2 (two) times daily. 180 tablet 0   insulin aspart (NOVOLOG FLEXPEN) 100 UNIT/ML FlexPen Inject 10-12 Units into the skin See admin instructions. Inject 10 units subcutaneously before breakfast and 12 units before supper (Patient taking differently: Inject 8 Units into the skin in the morning and at bedtime.) 15 mL 5   insulin degludec (TRESIBA FLEXTOUCH) 200 UNIT/ML FlexTouch Pen INJECT 44 UNITS SUBCUTANEOUSLY ONCE DAILY (Patient taking differently: Inject 36 Units into the skin every evening.) 9 mL 2   Insulin Syringe-Needle U-100 (INSULIN SYRINGE 1CC/31GX5/16") 31G X 5/16" 1 ML MISC 1 each by Does not apply route 3 (three) times daily. Use insulin syringe to inject insulin three times daily. 100 each 2   iron  sucrose in sodium chloride 0.9 % 100 mL Iron Sucrose (Venofer)     isosorbide mononitrate (IMDUR) 30 MG 24 hr tablet TAKE 2 TABLETS DAILY. (Patient taking differently: Take 60 mg by mouth every evening. TAKE 2 TABLETS DAILY.) 180 tablet 3   liraglutide (VICTOZA) 18 MG/3ML SOPN Inject 1.8 mg into the skin daily before supper. (Patient taking differently: Inject 1.2 mg into the skin daily before supper.) 3 mL 1   Omega-3 Fatty Acids (FISH OIL) 1000 MG CAPS Take 1 capsule (1,000 mg total) by mouth every morning. 30 capsule 1   pantoprazole (PROTONIX) 40 MG tablet Take 1 tablet (40 mg total) by mouth daily. 90 tablet 0   sertraline (ZOLOFT) 100 MG tablet Take 1 tablet (100 mg total) by mouth daily. 90 tablet 1   sevelamer carbonate (RENVELA) 800 MG tablet Take 800 mg by mouth 3 (three) times daily with meals.     No current facility-administered medications for this encounter.    Allergies  Allergen Reactions   Ativan [Lorazepam] Anxiety and Other (See Comments)    Pt gets more agitated   Adhesive [Tape] Other (See Comments)    blisters   Niacin Rash    Social History   Socioeconomic History   Marital status: Married    Spouse name: Not on file   Number of children: 3   Years of education: 12   Highest education level: Not on file  Occupational History   Occupation: Retired  Tobacco Use   Smoking status: Former    Packs/day: 2.00    Years: 30.00    Pack years: 60.00    Types: Cigarettes    Quit date: 03/03/1984    Years since quitting: 37.1   Smokeless tobacco: Never   Tobacco comments:    Former smoker 03/15/2021  Vaping Use   Vaping Use: Never used  Substance and Sexual Activity   Alcohol use: Not Currently    Comment: 1  beer a month   Drug use: No   Sexual activity: Not Currently  Other Topics Concern   Not on file  Social History Narrative   Currently resides with his wife. 1 dog. Fun/Hobby: Golf    Denies any religious beliefs effecting health care.  Social  Determinants of Health   Financial Resource Strain: Low Risk    Difficulty of Paying Living Expenses: Not hard at all  Food Insecurity: No Food Insecurity   Worried About Charity fundraiser in the Last Year: Never true   [place] in the Last Year: Never true  Transportation Needs: No Transportation Needs   Lack of Transportation (Medical): No   Lack of Transportation (Non-Medical): No  Physical Activity: Insufficiently Active   Days of Exercise per Week: 3 days   Minutes of Exercise per Session: 20 min  Stress: No Stress Concern Present   Feeling of Stress : Not at all  Social Connections: Socially Isolated   Frequency of Communication with Friends and Family: Never   Frequency of Social Gatherings with Friends and Family: Once a week   Attends Religious Services: Never   Marine scientist or Organizations: No   Attends Archivist Meetings: Never   Marital Status: Married  Human resources officer Violence: Not At Risk   Fear of Current or Ex-Partner: No   Emotionally Abused: No   Physically Abused: No   Sexually Abused: No    Family History  Problem Relation Age of Onset   Cancer Mother        breast   Heart attack Father    Stroke Father     ROS- All systems are reviewed and negative except as per the HPI above  Physical Exam: Vitals:   04/05/21 1454  BP: (!) 102/50  Pulse: 77  Weight: 112.9 kg  Height: '6\' 4"'  (1.93 m)   Wt Readings from Last 3 Encounters:  04/05/21 112.9 kg  03/30/21 119.7 kg  03/15/21 111.2 kg    Labs: Lab Results  Component Value Date   NA 136 02/24/2021   K 4.7 02/24/2021   CL 98 02/24/2021   CO2 20 (L) 12/03/2020   GLUCOSE 107 (H) 02/24/2021   BUN 35 (H) 02/24/2021   CREATININE 6.80 (H) 02/24/2021   CALCIUM 9.0 12/03/2020   PHOS 4.8 (H) 12/03/2020   MG 2.2 01/12/2019   Lab Results  Component Value Date   INR 1.0 01/05/2019   Lab Results  Component Value Date   CHOL 86 11/10/2020   HDL 21.00 (L) 11/10/2020    LDLCALC 38 11/10/2020   TRIG 137.0 11/10/2020     GEN- The patient is well appearing, alert and oriented x 3 today.   Head- normocephalic, atraumatic Eyes-  Sclera clear, conjunctiva pink Ears- hearing intact Oropharynx- clear Neck- supple, no JVP Lymph- no cervical lymphadenopathy Lungs- Clear to ausculation bilaterally, normal work of breathing Heart- irregular rate and rhythm, no murmurs, rubs or gallops, PMI not laterally displaced GI- soft, NT, ND, + BS Extremities- no clubbing, cyanosis, or edema MS- no significant deformity or atrophy Skin- no rash or lesion Psych- euthymic mood, full affect Neuro- strength and sensation are intact  EKG- aflutter at 77 bpm, qrs int 118 ms, qtc  497 ms   Echo- 1. Left ventricular ejection fraction, by estimation, is 55 to 60%. The  left ventricle has normal function. The left ventricle has no regional  wall motion abnormalities. There is moderate concentric left ventricular  hypertrophy. Left ventricular  diastolic parameters are consistent with Grade II diastolic dysfunction  (pseudonormalization). Elevated left atrial pressure.   2. Right ventricular systolic function is normal. The right ventricular  size is normal. Mildly increased right ventricular wall thickness.  Tricuspid regurgitation signal is inadequate for  assessing PA pressure.   3. Left atrial size was mildly dilated.   4. A small pericardial effusion is present. The pericardial effusion is  circumferential. There is no evidence of cardiac tamponade.   5. The mitral valve is normal in structure. No evidence of mitral valve  regurgitation.   6. 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 There is mild calcification of the  aortic valve. There is moderate thickening of the aortic valve. Aortic  valve regurgitation is trivial. Mild to moderate aortic valve  sclerosis/calcification is present, without any  evidence of aortic stenosis.   7. Aortic dilatation noted. There is mild  dilatation of the aortic root,  measuring 41 mm.   8. The inferior vena cava is dilated in size with >50% respiratory  variability, suggesting right atrial pressure of 8 mmHg.  Assessment and Plan:  1. Persistent afib/flutter  Present since July 2022 He is rate controlled  He is asymptomatic I discussed cardioversion but pt does not want to pursue Continue carvedilol 12.5 mg bid   2. CHA2DS2VASc  score of 8  Eliquis 2.5 mg bid (creatinine over 1.5 and age 13 )  Tolerating well Cbc drawn today   3. CKD Per dialysis  4. HTN Soft BP   5. OSA Uses CPAP  F/u in 2 weeks Will discuss later possibility of pursuing cardioversion  I will request f/u with Dr. Caryl Comes in 3-4 months    Geroge Baseman. Joelynn Dust, [facility] [address] [place], [place] [address] [phone]

## 2021-04-07 ENCOUNTER — Other Ambulatory Visit: Payer: Self-pay

## 2021-04-07 ENCOUNTER — Encounter: Payer: Medicare Other | Admitting: Endocrinology

## 2021-04-07 ENCOUNTER — Encounter: Payer: Self-pay | Admitting: Endocrinology

## 2021-04-07 NOTE — Progress Notes (Signed)
This encounter was created in error - please disregard.

## 2021-04-09 DIAGNOSIS — N186 End stage renal disease: Secondary | ICD-10-CM | POA: Diagnosis not present

## 2021-04-09 DIAGNOSIS — N2581 Secondary hyperparathyroidism of renal origin: Secondary | ICD-10-CM | POA: Diagnosis not present

## 2021-04-09 DIAGNOSIS — D509 Iron deficiency anemia, unspecified: Secondary | ICD-10-CM | POA: Diagnosis not present

## 2021-04-09 DIAGNOSIS — Z23 Encounter for immunization: Secondary | ICD-10-CM | POA: Diagnosis not present

## 2021-04-09 DIAGNOSIS — E138 Other specified diabetes mellitus with unspecified complications: Secondary | ICD-10-CM | POA: Diagnosis not present

## 2021-04-09 DIAGNOSIS — Z992 Dependence on renal dialysis: Secondary | ICD-10-CM | POA: Diagnosis not present

## 2021-04-10 DIAGNOSIS — N186 End stage renal disease: Secondary | ICD-10-CM | POA: Diagnosis not present

## 2021-04-10 DIAGNOSIS — D509 Iron deficiency anemia, unspecified: Secondary | ICD-10-CM | POA: Diagnosis not present

## 2021-04-10 DIAGNOSIS — E138 Other specified diabetes mellitus with unspecified complications: Secondary | ICD-10-CM | POA: Diagnosis not present

## 2021-04-10 DIAGNOSIS — N2581 Secondary hyperparathyroidism of renal origin: Secondary | ICD-10-CM | POA: Diagnosis not present

## 2021-04-10 DIAGNOSIS — Z23 Encounter for immunization: Secondary | ICD-10-CM | POA: Diagnosis not present

## 2021-04-10 DIAGNOSIS — Z992 Dependence on renal dialysis: Secondary | ICD-10-CM | POA: Diagnosis not present

## 2021-04-11 DIAGNOSIS — Z992 Dependence on renal dialysis: Secondary | ICD-10-CM | POA: Diagnosis not present

## 2021-04-11 DIAGNOSIS — N186 End stage renal disease: Secondary | ICD-10-CM | POA: Diagnosis not present

## 2021-04-11 DIAGNOSIS — D509 Iron deficiency anemia, unspecified: Secondary | ICD-10-CM | POA: Diagnosis not present

## 2021-04-11 DIAGNOSIS — E138 Other specified diabetes mellitus with unspecified complications: Secondary | ICD-10-CM | POA: Diagnosis not present

## 2021-04-11 DIAGNOSIS — N2581 Secondary hyperparathyroidism of renal origin: Secondary | ICD-10-CM | POA: Diagnosis not present

## 2021-04-11 DIAGNOSIS — Z23 Encounter for immunization: Secondary | ICD-10-CM | POA: Diagnosis not present

## 2021-04-13 DIAGNOSIS — Z992 Dependence on renal dialysis: Secondary | ICD-10-CM | POA: Diagnosis not present

## 2021-04-13 DIAGNOSIS — E138 Other specified diabetes mellitus with unspecified complications: Secondary | ICD-10-CM | POA: Diagnosis not present

## 2021-04-13 DIAGNOSIS — N186 End stage renal disease: Secondary | ICD-10-CM | POA: Diagnosis not present

## 2021-04-13 DIAGNOSIS — N2581 Secondary hyperparathyroidism of renal origin: Secondary | ICD-10-CM | POA: Diagnosis not present

## 2021-04-13 DIAGNOSIS — D509 Iron deficiency anemia, unspecified: Secondary | ICD-10-CM | POA: Diagnosis not present

## 2021-04-13 DIAGNOSIS — Z23 Encounter for immunization: Secondary | ICD-10-CM | POA: Diagnosis not present

## 2021-04-14 ENCOUNTER — Encounter: Payer: Self-pay | Admitting: Endocrinology

## 2021-04-14 ENCOUNTER — Other Ambulatory Visit: Payer: Medicare Other

## 2021-04-15 DIAGNOSIS — E1129 Type 2 diabetes mellitus with other diabetic kidney complication: Secondary | ICD-10-CM | POA: Diagnosis not present

## 2021-04-15 DIAGNOSIS — N2581 Secondary hyperparathyroidism of renal origin: Secondary | ICD-10-CM | POA: Diagnosis not present

## 2021-04-15 DIAGNOSIS — D509 Iron deficiency anemia, unspecified: Secondary | ICD-10-CM | POA: Diagnosis not present

## 2021-04-15 DIAGNOSIS — Z23 Encounter for immunization: Secondary | ICD-10-CM | POA: Diagnosis not present

## 2021-04-15 DIAGNOSIS — D631 Anemia in chronic kidney disease: Secondary | ICD-10-CM | POA: Diagnosis not present

## 2021-04-15 DIAGNOSIS — Z992 Dependence on renal dialysis: Secondary | ICD-10-CM | POA: Diagnosis not present

## 2021-04-15 DIAGNOSIS — E138 Other specified diabetes mellitus with unspecified complications: Secondary | ICD-10-CM | POA: Diagnosis not present

## 2021-04-15 DIAGNOSIS — N186 End stage renal disease: Secondary | ICD-10-CM | POA: Diagnosis not present

## 2021-04-16 ENCOUNTER — Other Ambulatory Visit: Payer: Self-pay

## 2021-04-16 ENCOUNTER — Telehealth: Payer: Medicare Other | Admitting: Endocrinology

## 2021-04-16 NOTE — Telephone Encounter (Signed)
Appointment was rescheduled after speaking with patient.

## 2021-04-17 DIAGNOSIS — D631 Anemia in chronic kidney disease: Secondary | ICD-10-CM | POA: Diagnosis not present

## 2021-04-17 DIAGNOSIS — Z992 Dependence on renal dialysis: Secondary | ICD-10-CM | POA: Diagnosis not present

## 2021-04-17 DIAGNOSIS — D509 Iron deficiency anemia, unspecified: Secondary | ICD-10-CM | POA: Diagnosis not present

## 2021-04-17 DIAGNOSIS — E138 Other specified diabetes mellitus with unspecified complications: Secondary | ICD-10-CM | POA: Diagnosis not present

## 2021-04-17 DIAGNOSIS — N186 End stage renal disease: Secondary | ICD-10-CM | POA: Diagnosis not present

## 2021-04-17 DIAGNOSIS — N2581 Secondary hyperparathyroidism of renal origin: Secondary | ICD-10-CM | POA: Diagnosis not present

## 2021-04-19 DIAGNOSIS — Z452 Encounter for adjustment and management of vascular access device: Secondary | ICD-10-CM | POA: Diagnosis not present

## 2021-04-19 DIAGNOSIS — N186 End stage renal disease: Secondary | ICD-10-CM | POA: Diagnosis not present

## 2021-04-19 DIAGNOSIS — Z992 Dependence on renal dialysis: Secondary | ICD-10-CM | POA: Diagnosis not present

## 2021-04-20 DIAGNOSIS — N2581 Secondary hyperparathyroidism of renal origin: Secondary | ICD-10-CM | POA: Diagnosis not present

## 2021-04-20 DIAGNOSIS — D509 Iron deficiency anemia, unspecified: Secondary | ICD-10-CM | POA: Diagnosis not present

## 2021-04-20 DIAGNOSIS — Z992 Dependence on renal dialysis: Secondary | ICD-10-CM | POA: Diagnosis not present

## 2021-04-20 DIAGNOSIS — N186 End stage renal disease: Secondary | ICD-10-CM | POA: Diagnosis not present

## 2021-04-20 DIAGNOSIS — D631 Anemia in chronic kidney disease: Secondary | ICD-10-CM | POA: Diagnosis not present

## 2021-04-20 DIAGNOSIS — E138 Other specified diabetes mellitus with unspecified complications: Secondary | ICD-10-CM | POA: Diagnosis not present

## 2021-04-22 DIAGNOSIS — Z992 Dependence on renal dialysis: Secondary | ICD-10-CM | POA: Diagnosis not present

## 2021-04-22 DIAGNOSIS — D509 Iron deficiency anemia, unspecified: Secondary | ICD-10-CM | POA: Diagnosis not present

## 2021-04-22 DIAGNOSIS — D631 Anemia in chronic kidney disease: Secondary | ICD-10-CM | POA: Diagnosis not present

## 2021-04-22 DIAGNOSIS — E138 Other specified diabetes mellitus with unspecified complications: Secondary | ICD-10-CM | POA: Diagnosis not present

## 2021-04-22 DIAGNOSIS — N186 End stage renal disease: Secondary | ICD-10-CM | POA: Diagnosis not present

## 2021-04-22 DIAGNOSIS — N2581 Secondary hyperparathyroidism of renal origin: Secondary | ICD-10-CM | POA: Diagnosis not present

## 2021-04-24 DIAGNOSIS — N2581 Secondary hyperparathyroidism of renal origin: Secondary | ICD-10-CM | POA: Diagnosis not present

## 2021-04-24 DIAGNOSIS — D631 Anemia in chronic kidney disease: Secondary | ICD-10-CM | POA: Diagnosis not present

## 2021-04-24 DIAGNOSIS — N186 End stage renal disease: Secondary | ICD-10-CM | POA: Diagnosis not present

## 2021-04-24 DIAGNOSIS — Z992 Dependence on renal dialysis: Secondary | ICD-10-CM | POA: Diagnosis not present

## 2021-04-24 DIAGNOSIS — E138 Other specified diabetes mellitus with unspecified complications: Secondary | ICD-10-CM | POA: Diagnosis not present

## 2021-04-24 DIAGNOSIS — D509 Iron deficiency anemia, unspecified: Secondary | ICD-10-CM | POA: Diagnosis not present

## 2021-04-27 DIAGNOSIS — D509 Iron deficiency anemia, unspecified: Secondary | ICD-10-CM | POA: Diagnosis not present

## 2021-04-27 DIAGNOSIS — D631 Anemia in chronic kidney disease: Secondary | ICD-10-CM | POA: Diagnosis not present

## 2021-04-27 DIAGNOSIS — E138 Other specified diabetes mellitus with unspecified complications: Secondary | ICD-10-CM | POA: Diagnosis not present

## 2021-04-27 DIAGNOSIS — N186 End stage renal disease: Secondary | ICD-10-CM | POA: Diagnosis not present

## 2021-04-27 DIAGNOSIS — Z992 Dependence on renal dialysis: Secondary | ICD-10-CM | POA: Diagnosis not present

## 2021-04-27 DIAGNOSIS — N2581 Secondary hyperparathyroidism of renal origin: Secondary | ICD-10-CM | POA: Diagnosis not present

## 2021-04-29 DIAGNOSIS — N2581 Secondary hyperparathyroidism of renal origin: Secondary | ICD-10-CM | POA: Diagnosis not present

## 2021-04-29 DIAGNOSIS — D509 Iron deficiency anemia, unspecified: Secondary | ICD-10-CM | POA: Diagnosis not present

## 2021-04-29 DIAGNOSIS — E138 Other specified diabetes mellitus with unspecified complications: Secondary | ICD-10-CM | POA: Diagnosis not present

## 2021-04-29 DIAGNOSIS — N186 End stage renal disease: Secondary | ICD-10-CM | POA: Diagnosis not present

## 2021-04-29 DIAGNOSIS — Z992 Dependence on renal dialysis: Secondary | ICD-10-CM | POA: Diagnosis not present

## 2021-04-29 DIAGNOSIS — D631 Anemia in chronic kidney disease: Secondary | ICD-10-CM | POA: Diagnosis not present

## 2021-05-01 DIAGNOSIS — N2581 Secondary hyperparathyroidism of renal origin: Secondary | ICD-10-CM | POA: Diagnosis not present

## 2021-05-01 DIAGNOSIS — E138 Other specified diabetes mellitus with unspecified complications: Secondary | ICD-10-CM | POA: Diagnosis not present

## 2021-05-01 DIAGNOSIS — D509 Iron deficiency anemia, unspecified: Secondary | ICD-10-CM | POA: Diagnosis not present

## 2021-05-01 DIAGNOSIS — D631 Anemia in chronic kidney disease: Secondary | ICD-10-CM | POA: Diagnosis not present

## 2021-05-01 DIAGNOSIS — Z992 Dependence on renal dialysis: Secondary | ICD-10-CM | POA: Diagnosis not present

## 2021-05-01 DIAGNOSIS — N186 End stage renal disease: Secondary | ICD-10-CM | POA: Diagnosis not present

## 2021-05-04 DIAGNOSIS — E138 Other specified diabetes mellitus with unspecified complications: Secondary | ICD-10-CM | POA: Diagnosis not present

## 2021-05-04 DIAGNOSIS — D509 Iron deficiency anemia, unspecified: Secondary | ICD-10-CM | POA: Diagnosis not present

## 2021-05-04 DIAGNOSIS — Z992 Dependence on renal dialysis: Secondary | ICD-10-CM | POA: Diagnosis not present

## 2021-05-04 DIAGNOSIS — D631 Anemia in chronic kidney disease: Secondary | ICD-10-CM | POA: Diagnosis not present

## 2021-05-04 DIAGNOSIS — N186 End stage renal disease: Secondary | ICD-10-CM | POA: Diagnosis not present

## 2021-05-04 DIAGNOSIS — N2581 Secondary hyperparathyroidism of renal origin: Secondary | ICD-10-CM | POA: Diagnosis not present

## 2021-05-06 DIAGNOSIS — Z992 Dependence on renal dialysis: Secondary | ICD-10-CM | POA: Diagnosis not present

## 2021-05-06 DIAGNOSIS — N2581 Secondary hyperparathyroidism of renal origin: Secondary | ICD-10-CM | POA: Diagnosis not present

## 2021-05-06 DIAGNOSIS — D631 Anemia in chronic kidney disease: Secondary | ICD-10-CM | POA: Diagnosis not present

## 2021-05-06 DIAGNOSIS — E138 Other specified diabetes mellitus with unspecified complications: Secondary | ICD-10-CM | POA: Diagnosis not present

## 2021-05-06 DIAGNOSIS — D509 Iron deficiency anemia, unspecified: Secondary | ICD-10-CM | POA: Diagnosis not present

## 2021-05-06 DIAGNOSIS — N186 End stage renal disease: Secondary | ICD-10-CM | POA: Diagnosis not present

## 2021-05-08 DIAGNOSIS — Z992 Dependence on renal dialysis: Secondary | ICD-10-CM | POA: Diagnosis not present

## 2021-05-08 DIAGNOSIS — N186 End stage renal disease: Secondary | ICD-10-CM | POA: Diagnosis not present

## 2021-05-08 DIAGNOSIS — E138 Other specified diabetes mellitus with unspecified complications: Secondary | ICD-10-CM | POA: Diagnosis not present

## 2021-05-08 DIAGNOSIS — N2581 Secondary hyperparathyroidism of renal origin: Secondary | ICD-10-CM | POA: Diagnosis not present

## 2021-05-08 DIAGNOSIS — D631 Anemia in chronic kidney disease: Secondary | ICD-10-CM | POA: Diagnosis not present

## 2021-05-08 DIAGNOSIS — D509 Iron deficiency anemia, unspecified: Secondary | ICD-10-CM | POA: Diagnosis not present

## 2021-05-11 ENCOUNTER — Other Ambulatory Visit: Payer: Self-pay | Admitting: *Deleted

## 2021-05-11 DIAGNOSIS — D631 Anemia in chronic kidney disease: Secondary | ICD-10-CM | POA: Diagnosis not present

## 2021-05-11 DIAGNOSIS — Z992 Dependence on renal dialysis: Secondary | ICD-10-CM | POA: Diagnosis not present

## 2021-05-11 DIAGNOSIS — E138 Other specified diabetes mellitus with unspecified complications: Secondary | ICD-10-CM | POA: Diagnosis not present

## 2021-05-11 DIAGNOSIS — N2581 Secondary hyperparathyroidism of renal origin: Secondary | ICD-10-CM | POA: Diagnosis not present

## 2021-05-11 DIAGNOSIS — D509 Iron deficiency anemia, unspecified: Secondary | ICD-10-CM | POA: Diagnosis not present

## 2021-05-11 DIAGNOSIS — N186 End stage renal disease: Secondary | ICD-10-CM | POA: Diagnosis not present

## 2021-05-11 MED ORDER — HYDRALAZINE HCL 100 MG PO TABS: 100.0000 mg | ORAL_TABLET | Freq: Two times a day (BID) | ORAL | 1 refills | Status: DC

## 2021-05-15 DIAGNOSIS — D509 Iron deficiency anemia, unspecified: Secondary | ICD-10-CM | POA: Diagnosis not present

## 2021-05-15 DIAGNOSIS — E138 Other specified diabetes mellitus with unspecified complications: Secondary | ICD-10-CM | POA: Diagnosis not present

## 2021-05-15 DIAGNOSIS — D631 Anemia in chronic kidney disease: Secondary | ICD-10-CM | POA: Diagnosis not present

## 2021-05-15 DIAGNOSIS — N186 End stage renal disease: Secondary | ICD-10-CM | POA: Diagnosis not present

## 2021-05-15 DIAGNOSIS — Z992 Dependence on renal dialysis: Secondary | ICD-10-CM | POA: Diagnosis not present

## 2021-05-15 DIAGNOSIS — N2581 Secondary hyperparathyroidism of renal origin: Secondary | ICD-10-CM | POA: Diagnosis not present

## 2021-05-16 DIAGNOSIS — Z992 Dependence on renal dialysis: Secondary | ICD-10-CM | POA: Diagnosis not present

## 2021-05-16 DIAGNOSIS — N186 End stage renal disease: Secondary | ICD-10-CM | POA: Diagnosis not present

## 2021-05-16 DIAGNOSIS — E1129 Type 2 diabetes mellitus with other diabetic kidney complication: Secondary | ICD-10-CM | POA: Diagnosis not present

## 2021-05-18 DIAGNOSIS — E8770 Fluid overload, unspecified: Secondary | ICD-10-CM | POA: Diagnosis not present

## 2021-05-18 DIAGNOSIS — D509 Iron deficiency anemia, unspecified: Secondary | ICD-10-CM | POA: Diagnosis not present

## 2021-05-18 DIAGNOSIS — N186 End stage renal disease: Secondary | ICD-10-CM | POA: Diagnosis not present

## 2021-05-18 DIAGNOSIS — D631 Anemia in chronic kidney disease: Secondary | ICD-10-CM | POA: Diagnosis not present

## 2021-05-18 DIAGNOSIS — Z23 Encounter for immunization: Secondary | ICD-10-CM | POA: Diagnosis not present

## 2021-05-18 DIAGNOSIS — Z992 Dependence on renal dialysis: Secondary | ICD-10-CM | POA: Diagnosis not present

## 2021-05-18 DIAGNOSIS — N2581 Secondary hyperparathyroidism of renal origin: Secondary | ICD-10-CM | POA: Diagnosis not present

## 2021-05-18 DIAGNOSIS — E138 Other specified diabetes mellitus with unspecified complications: Secondary | ICD-10-CM | POA: Diagnosis not present

## 2021-05-19 ENCOUNTER — Other Ambulatory Visit: Payer: Self-pay

## 2021-05-19 ENCOUNTER — Encounter: Payer: Self-pay | Admitting: Podiatry

## 2021-05-19 ENCOUNTER — Ambulatory Visit (INDEPENDENT_AMBULATORY_CARE_PROVIDER_SITE_OTHER): Payer: Medicare Other | Admitting: Podiatry

## 2021-05-19 DIAGNOSIS — N400 Enlarged prostate without lower urinary tract symptoms: Secondary | ICD-10-CM | POA: Insufficient documentation

## 2021-05-19 DIAGNOSIS — E1151 Type 2 diabetes mellitus with diabetic peripheral angiopathy without gangrene: Secondary | ICD-10-CM | POA: Diagnosis not present

## 2021-05-19 DIAGNOSIS — Z89411 Acquired absence of right great toe: Secondary | ICD-10-CM | POA: Diagnosis not present

## 2021-05-19 DIAGNOSIS — Z89421 Acquired absence of other right toe(s): Secondary | ICD-10-CM | POA: Diagnosis not present

## 2021-05-19 DIAGNOSIS — E1142 Type 2 diabetes mellitus with diabetic polyneuropathy: Secondary | ICD-10-CM | POA: Diagnosis not present

## 2021-05-19 DIAGNOSIS — E119 Type 2 diabetes mellitus without complications: Secondary | ICD-10-CM

## 2021-05-19 DIAGNOSIS — B351 Tinea unguium: Secondary | ICD-10-CM

## 2021-05-19 NOTE — Progress Notes (Signed)
ANNUAL DIABETIC FOOT EXAM  Subjective: David [name] presents today for annual diabetic foot examination, at risk foot care. Patient has h/o amputation of partial amputation of R hallux and digital amputation R 2nd toe, and thick, elongated toenails b/l feet which are tender when wearing enclosed shoe gear..  Patient relates 30 year h/o diabetes.  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  Patient has been diagnosed with neuropathy.  David [name] is now on dialysis and receives dialysis on Tuesday, Thursday and Saturdays.   Patient's blood sugar was 107 mg/dl today.   He is accompanied by his wife on today's visit. Wife states David [name] sometimes bumps his feet and gets small abrasions which she treats with Neosporin until healed. No concerning areas noted today.  David Barrack, David [name] is patient's PCP. Last visit was 02/25/2021.  Past Medical History:  Diagnosis Date   Acute cholecystitis s/p lap cholecystectomy 03/05/2017 03/04/2017   Anemia, iron deficiency    Anxiety    Arthritis    Atrial fibrillation with rapid ventricular response (HCC)    BPH (benign prostatic hypertrophy)    CAD (coronary artery disease)    Nonobstructive CAD per cath   Cancer [facility])    Cardiac pacemaker in situ    CHF (congestive heart failure) (HCC)    Chronic ulcer of right foot ([place])    resolved per patient   CKD (chronic kidney disease) stage 4, GFR 15-29 ml/min ([place]) 09/26/2017   CKD (chronic kidney disease), stage III (HCC) secondary to DM and HTN   nephrologist-  [name]   Coronary artery disease involving native coronary artery of native heart without angina pectoris    Depression    Diastolic dysfunction    Dyspnea    with exertion, no oxygen   Dysrhythmia    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 ([place]) 09/04/2017   Hearing loss    wears hearing aids   History of cellulitis    right great toe 10-25-2014   History of skin  cancer    HOCM (hypertrophic obstructive cardiomyopathy) (HCC)    HOH (hard of hearing)    Hyperlipidemia associated with type 2 diabetes mellitus ([place]) 10/25/2013   Hypertension    Hypertension associated with diabetes ([place]) 10/25/2013   IDDM (insulin dependent diabetes mellitus) - on insulin pump 03/04/2017   Insulin dependent type 2 diabetes mellitus ([place]) 1991   followd by [name]--  has insulin pump   Insulin pump in place    Patient does not have insulin pump - removed approx 2 yrs ago 2019   Memory loss    mild per patient   OSA on CPAP    Pacemaker 06/03/2015   Medtronic   Peripheral neuropathy    severe - feet/lower legs   Peripheral vascular disease (HCC)    bilateral lower extremities   PVD (peripheral vascular disease) ([place])    Rib fracture 07/24/2015   Seasonal and perennial allergic rhinitis 10/25/2013   Seborrheic keratosis 09/19/2016   Secondary hyperparathyroidism of renal origin ([place])    Sinus node arrhythmia 06/03/2015   Sinus node dysfunction ([place])    Sleep apnea    Syncope 07/24/2015   Walker as ambulation aid    Patient Active Problem List   Diagnosis Date Noted   Hypertrophy of prostate without urinary obstruction and other lower urinary tract symptoms (LUTS) 05/19/2021   (HFpEF) heart failure with preserved ejection fraction ([place]) 02/25/2021   Other  specified coagulation defects ([place]) 01/19/2021   Secondary hyperparathyroidism of renal origin ([place]) 01/19/2021   Encounter for screening for respiratory tuberculosis 01/15/2021   Allergy, unspecified, initial encounter 12/24/2020   Anaphylactic shock, unspecified, initial encounter 03/18/1593   Complication of vascular dialysis catheter 12/24/2020   Iron deficiency anemia, unspecified 12/18/2020   Anemia in chronic kidney disease 12/08/2020   Gout 05/18/2020   Leukocytosis 04/20/2020   History of CVA (cerebrovascular accident) 04/20/2020   Debility 03/20/2020   Osteoarthritis 03/13/2020   Diarrhea  02/18/2019   GERD (gastroesophageal reflux disease) 01/24/2019   Memory change 11/19/2018   Senile purpura ([place]) 11/27/2017   CKD (chronic kidney disease) stage 4, GFR 15-29 ml/min (HCC) 09/26/2017   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 ([place]) 09/04/2017   Nonobstructive CAD s/p PCI 2012    Cardiac pacemaker in situ    PVD (peripheral vascular disease) ([place])    Anxiety 03/22/2017   Acute cholecystitis s/p lap cholecystectomy 03/05/2017 03/04/2017   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[place]) 03/04/2017   IDDM (insulin dependent diabetes mellitus) - on insulin pump 03/04/2017   Fatigue 01/27/2017   Low vitamin B12 level 01/27/2017   OSA on CPAP 10/26/2014   Seasonal and perennial allergic rhinitis 10/25/2013   Hypertension associated with diabetes ([place]) 10/25/2013   Hyperlipidemia associated with type 2 diabetes mellitus ([place]) 10/25/2013   Past Surgical History:  Procedure Laterality Date   AMPUTATION OF REPLICATED TOES  Mar 5859   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)   AMPUTATION TOE Right 03/12/2015   Procedure: RIGHT HALLUS AMPUTATION ;  Surgeon: [name], David [name];  Location: [place];  Service: Podiatry;  Laterality: Right;   AV FISTULA PLACEMENT Left 04/16/2020   Procedure: CREATION OF LEFT Brachial/Cephalic Arteriovenous Fistula.;  Surgeon: Cherre Robins, David [name];  Location: [place];  Service: Vascular;  Laterality: Left;   [place] Left 08/06/2020   Procedure: BRANCH LIGATION OF LEFT UPPER EXTREMITY ARTERIOVENOUS FISTULA;  Surgeon: Cherre Robins, David [name];  Location: [place];  Service: Vascular;  Laterality: Left;   CARDIAC CATHETERIZATION  11-25-2010   [place], [place]   Nonobstructive CAD   CARDIAC PACEMAKER PLACEMENT  Nov 2009   Medtronic   CHOLECYSTECTOMY N/A 03/05/2017   Procedure: LAPAROSCOPIC CHOLECYSTECTOMY WITH INTRAOPERATIVE CHOLANGIOGRAM;  Surgeon: [name], David [name];  Location: [facility];  Service: General;  Laterality:  N/A;   EP IMPLANTABLE DEVICE N/A 06/03/2015   Procedure: PPM Generator Changeout;  Surgeon: [name], David [name];  Location: [facility];  Service: Cardiovascular;  Laterality: N/A;   EXCISION BONE CYST Right 03/06/2015   Procedure: BONE BIOPSIES OF RIGHT FOOT;  Surgeon: [name], David [name];  Location: [place];  Service: Podiatry;  Laterality: Right;   EYE SURGERY Bilateral    KNEE ARTHROSCOPY     ORIF ANKLE FRACTURE Left 11/06/2014   Procedure: OPEN REDUCTION INTERNAL FIXATION (ORIF) LEFT  ANKLE FRACTURE;  Surgeon: [name], David [name];  Location: [place];  Service: Orthopedics;  Laterality: Left;   REVISON OF ARTERIOVENOUS FISTULA Left 02/24/2021   Procedure: REVISION OF LEFT ARTERIOVENOUS FISTULA with INTERPOSITION GRAFT USING ARTEGRAFT COLLAGEN GRAFT;  Surgeon: Cherre Robins, David [name];  Location: [place];  Service: Vascular;  Laterality: Left;   TEE WITHOUT CARDIOVERSION N/A 09/01/2017   Procedure: TRANSESOPHAGEAL ECHOCARDIOGRAM (TEE);  Surgeon: [name], David [name];  Location: [facility];  Service: Cardiovascular;  Laterality: N/A;   TOTAL KNEE ARTHROPLASTY     patient denies this surgery  VEIN LIGATION AND STRIPPING     Current Outpatient Medications on File Prior to Visit  Medication Sig Dispense Refill   acetaminophen (TYLENOL) 500 MG tablet Take 1,000 mg by mouth 2 (two) times daily as needed (pain.).     allopurinol (ZYLOPRIM) 100 MG tablet Take 1 tablet (100 mg total) by mouth daily. 90 tablet 3   apixaban (ELIQUIS) 2.5 MG TABS tablet Take 1 tablet (2.5 mg total) by mouth 2 (two) times daily. 180 tablet 1   atorvastatin (LIPITOR) 10 MG tablet Take 1 tablet (10 mg total) by mouth every evening. 90 tablet 1   blood glucose meter kit and supplies KIT Dispense based on patient and insurance preference. Use up to four times daily as directed. (FOR ICD-9 250.00, 250.01). 1 each 0   calcitRIOL (ROCALTROL) 0.25 MCG capsule Take 1 capsule (0.25 mcg total) by mouth  daily. 90 capsule 0   carvedilol (COREG) 6.25 MG tablet Take 6.25 mg by mouth 2 (two) times daily.     cetirizine (ZYRTEC) 10 MG tablet Take 10 mg by mouth daily.     Darbepoetin Alfa (ARANESP, ALBUMIN FREE,) 60 MCG/ML SOLN Inject 60 mcg as directed every 30 (thirty) days.     Epoetin Alfa (EPOGEN IJ) Inject as directed See admin instructions. Every 4 weeks     folic acid (FOLVITE) 1 MG tablet Take 1 mg by mouth in the morning.     folic acid-vitamin b complex-vitamin c-selenium-zinc (DIALYVITE) 3 MG TABS tablet Take 1 tablet by mouth every evening.     glucose blood test strip Use Contour test strips as instructed to check blood sugar four times daily. 200 each 2   hydrALAZINE (APRESOLINE) 100 MG tablet Take 1 tablet (100 mg total) by mouth 2 (two) times daily. 180 tablet 1   insulin aspart (NOVOLOG FLEXPEN) 100 UNIT/ML FlexPen Inject 10-12 Units into the skin See admin instructions. Inject 10 units subcutaneously before breakfast and 12 units before supper (Patient taking differently: Inject 8 Units into the skin in the morning and at bedtime.) 15 mL 5   insulin degludec (TRESIBA FLEXTOUCH) 200 UNIT/ML FlexTouch Pen INJECT 44 UNITS SUBCUTANEOUSLY ONCE DAILY (Patient taking differently: Inject 36 Units into the skin every evening.) 9 mL 2   Insulin Syringe-Needle U-100 (INSULIN SYRINGE 1CC/31GX5/16") 31G X 5/16" 1 ML MISC 1 each by Does not apply route 3 (three) times daily. Use insulin syringe to inject insulin three times daily. 100 each 2   iron sucrose in sodium chloride 0.9 % 100 mL Iron Sucrose (Venofer)     isosorbide mononitrate (IMDUR) 30 MG 24 hr tablet TAKE 2 TABLETS DAILY. (Patient taking differently: Take 60 mg by mouth every evening. TAKE 2 TABLETS DAILY.) 180 tablet 3   liraglutide (VICTOZA) 18 MG/3ML SOPN Inject 1.8 mg into the skin daily before supper. (Patient taking differently: Inject 1.2 mg into the skin daily before supper.) 3 mL 1   Omega-3 Fatty Acids (FISH OIL) 1000 MG CAPS  Take 1 capsule (1,000 mg total) by mouth every morning. 30 capsule 1   pantoprazole (PROTONIX) 40 MG tablet Take 1 tablet (40 mg total) by mouth daily. 90 tablet 0   sertraline (ZOLOFT) 100 MG tablet Take 1 tablet (100 mg total) by mouth daily. 90 tablet 1   sevelamer carbonate (RENVELA) 800 MG tablet Take 800 mg by mouth 3 (three) times daily with meals.     No current facility-administered medications on file prior to visit.    Allergies  Allergen Reactions   Ativan [Lorazepam] Anxiety and Other (See Comments)    Pt gets more agitated   Adhesive [Tape] Other (See Comments)    blisters   Niacin Rash   Social History   Occupational History   Occupation: Retired  Tobacco Use   Smoking status: Former    Packs/day: 2.00    Years: 30.00    Pack years: 60.00    Types: Cigarettes    Quit date: 03/03/1984    Years since quitting: 37.2   Smokeless tobacco: Never   Tobacco comments:    Former smoker 03/15/2021  Vaping Use   Vaping Use: Never used  Substance and Sexual Activity   Alcohol use: Not Currently    Comment: 1  beer a month   Drug use: No   Sexual activity: Not Currently   Family History  Problem Relation Age of Onset   Cancer Mother        breast   Heart attack Father    Stroke Father    Immunization History  Administered Date(s) Administered   Fluad Quad(high Dose 65+) 03/05/2019   Influenza, High Dose Seasonal PF 02/09/2017   Influenza,inj,Quad PF,6+ Mos 01/30/2014   Influenza-Unspecified 03/17/2015, 04/15/2016, 01/14/2018, 02/11/2020   PFIZER(Purple Top)SARS-COV-2 Vaccination 06/21/2019, 07/16/2019, 02/22/2020     Review of Systems: Negative except as noted in the HPI.   Objective: There were no vitals filed for this visit.  HAZIM TREADWAY is a pleasant 81 y.o. male in NAD. AAO X 3.  Vascular Examination: CFT <4 seconds b/l LE. Diminished DP/PT pulses b/l LE. Pedal hair absent. No pain with calf compression b/l. Trace edema noted BLE. No ischemia or  gangrene noted b/l LE. No cyanosis or clubbing noted b/l LE.  Dermatological Examination: No open wounds b/l LE. No interdigital macerations noted b/l LE. Toenails 1-5 left, R 3rd toe, R 4th toe, and R 5th toe elongated, discolored, dystrophic, thickened, and crumbly with subungual debris and tenderness to dorsal palpation. Healing scabs noted on left hallux, left 2nd toe and right 3rd digit. Left 4th toenail impinging on digit distally with no break in skin. There is an indentation, but no erythema, no edema, no drainage and no fluctuance.  Musculoskeletal Examination: Lower extremity amputation(s): partial amputation of R hallux and digital amputation R 2nd toe. Utilizes wheelchair for mobility assistance.  Footwear Assessment: Does the patient wear appropriate shoes? Yes. Does the patient need inserts/orthotics? Yes.  Neurological Examination: Protective sensation diminished with 10g monofilament b/l. Vibratory sensation diminished b/l.  Hemoglobin A1C Latest Ref Rng & Units 05/21/2021 11/10/2020 09/02/2020  HGBA1C 4.6 - 6.5 % 6.7(H) 6.7(H) 6.4  Some recent data might be hidden   Assessment: 1. Onychomycosis   2. Status post amputation of right great toe ([place])   3. Status post amputation of lesser toe of right foot ([place])   4. Type II diabetes mellitus with peripheral circulatory disorder (HCC)   5. Diabetic peripheral neuropathy associated with type 2 diabetes mellitus ([place])   6. Encounter for diabetic foot exam ([place])     ADA Risk Categorization: High Risk  Patient has one or more of the following: Loss of protective sensation Absent pedal pulses Severe Foot deformity History of foot ulcer  Plan: -Examined patient. -Diabetic foot examination performed today. -Continue foot and shoe inspections daily. Monitor blood glucose per PCP/Endocrinologist's recommendations. -Patient to continue soft, supportive shoe gear daily. Start procedure for diabetic shoes. Patient qualifies based  on diagnoses. -Mycotic toenails 1-5 left, R 3rd  toe, R 4th toe, and R 5th toe were debrided in length and girth with sterile nail nippers and dremel without iatrogenic bleeding. -Patient/POA to call should there be question/concern in the interim.  Return in about 3 months (around 08/17/2021).  Marzetta Board, DPM

## 2021-05-20 DIAGNOSIS — E138 Other specified diabetes mellitus with unspecified complications: Secondary | ICD-10-CM | POA: Diagnosis not present

## 2021-05-20 DIAGNOSIS — N186 End stage renal disease: Secondary | ICD-10-CM | POA: Diagnosis not present

## 2021-05-20 DIAGNOSIS — Z992 Dependence on renal dialysis: Secondary | ICD-10-CM | POA: Diagnosis not present

## 2021-05-20 DIAGNOSIS — N2581 Secondary hyperparathyroidism of renal origin: Secondary | ICD-10-CM | POA: Diagnosis not present

## 2021-05-20 DIAGNOSIS — D509 Iron deficiency anemia, unspecified: Secondary | ICD-10-CM | POA: Diagnosis not present

## 2021-05-20 DIAGNOSIS — D631 Anemia in chronic kidney disease: Secondary | ICD-10-CM | POA: Diagnosis not present

## 2021-05-21 ENCOUNTER — Other Ambulatory Visit: Payer: Self-pay

## 2021-05-21 ENCOUNTER — Other Ambulatory Visit (INDEPENDENT_AMBULATORY_CARE_PROVIDER_SITE_OTHER): Payer: Medicare Other

## 2021-05-21 DIAGNOSIS — Z794 Long term (current) use of insulin: Secondary | ICD-10-CM | POA: Diagnosis not present

## 2021-05-21 DIAGNOSIS — E1165 Type 2 diabetes mellitus with hyperglycemia: Secondary | ICD-10-CM

## 2021-05-21 LAB — HEMOGLOBIN A1C: Hgb A1c MFr Bld: 6.7 % — ABNORMAL HIGH (ref 4.6–6.5)

## 2021-05-21 LAB — GLUCOSE, RANDOM: Glucose, Bld: 54 mg/dL — ABNORMAL LOW (ref 70–99)

## 2021-05-21 MED ORDER — CONTOUR NEXT EZ W/DEVICE KIT: PACK | 0 refills | Status: DC

## 2021-05-22 DIAGNOSIS — N2581 Secondary hyperparathyroidism of renal origin: Secondary | ICD-10-CM | POA: Diagnosis not present

## 2021-05-22 DIAGNOSIS — D509 Iron deficiency anemia, unspecified: Secondary | ICD-10-CM | POA: Diagnosis not present

## 2021-05-22 DIAGNOSIS — D631 Anemia in chronic kidney disease: Secondary | ICD-10-CM | POA: Diagnosis not present

## 2021-05-22 DIAGNOSIS — N186 End stage renal disease: Secondary | ICD-10-CM | POA: Diagnosis not present

## 2021-05-22 DIAGNOSIS — E138 Other specified diabetes mellitus with unspecified complications: Secondary | ICD-10-CM | POA: Diagnosis not present

## 2021-05-22 DIAGNOSIS — Z992 Dependence on renal dialysis: Secondary | ICD-10-CM | POA: Diagnosis not present

## 2021-05-22 LAB — FRUCTOSAMINE: Fructosamine: 341 umol/L — ABNORMAL HIGH (ref 0–285)

## 2021-05-25 ENCOUNTER — Other Ambulatory Visit: Payer: Self-pay | Admitting: Endocrinology

## 2021-05-25 ENCOUNTER — Other Ambulatory Visit: Payer: Self-pay | Admitting: *Deleted

## 2021-05-25 DIAGNOSIS — D509 Iron deficiency anemia, unspecified: Secondary | ICD-10-CM | POA: Diagnosis not present

## 2021-05-25 DIAGNOSIS — E1165 Type 2 diabetes mellitus with hyperglycemia: Secondary | ICD-10-CM

## 2021-05-25 DIAGNOSIS — N2581 Secondary hyperparathyroidism of renal origin: Secondary | ICD-10-CM | POA: Diagnosis not present

## 2021-05-25 DIAGNOSIS — E138 Other specified diabetes mellitus with unspecified complications: Secondary | ICD-10-CM | POA: Diagnosis not present

## 2021-05-25 DIAGNOSIS — Z794 Long term (current) use of insulin: Secondary | ICD-10-CM

## 2021-05-25 DIAGNOSIS — N186 End stage renal disease: Secondary | ICD-10-CM | POA: Diagnosis not present

## 2021-05-25 DIAGNOSIS — D631 Anemia in chronic kidney disease: Secondary | ICD-10-CM | POA: Diagnosis not present

## 2021-05-25 DIAGNOSIS — Z992 Dependence on renal dialysis: Secondary | ICD-10-CM | POA: Diagnosis not present

## 2021-05-25 MED ORDER — PANTOPRAZOLE SODIUM 40 MG PO TBEC: 40.0000 mg | DELAYED_RELEASE_TABLET | Freq: Every day | ORAL | 0 refills | Status: AC

## 2021-05-26 ENCOUNTER — Telehealth (INDEPENDENT_AMBULATORY_CARE_PROVIDER_SITE_OTHER): Payer: Medicare Other | Admitting: Endocrinology

## 2021-05-26 ENCOUNTER — Encounter: Payer: Self-pay | Admitting: Endocrinology

## 2021-05-26 ENCOUNTER — Other Ambulatory Visit: Payer: Self-pay

## 2021-05-26 VITALS — BP 145/75 | Wt 238.0 lb

## 2021-05-26 DIAGNOSIS — E1165 Type 2 diabetes mellitus with hyperglycemia: Secondary | ICD-10-CM | POA: Diagnosis not present

## 2021-05-26 DIAGNOSIS — E782 Mixed hyperlipidemia: Secondary | ICD-10-CM

## 2021-05-26 DIAGNOSIS — Z794 Long term (current) use of insulin: Secondary | ICD-10-CM

## 2021-05-26 NOTE — Progress Notes (Signed)
Patient ID: David [name], male   DOB: 02-Feb-1941, 81 y.o.   MRN: [id number]  I connected with the above-named patient by video enabled telemedicine application and verified that I am speaking with the correct person. The patient was explained the limitations of evaluation and management by telemedicine and the availability of in person appointments.  Patient also understood that there may be a patient responsible charge related to this service  Location of the patient: Patient's home  Location of the provider: Physician office Only the patient and myself were participating in the encounter The patient understood the above statements and agreed to proceed.  Reason for Appointment: Followup for Type 2 Diabetes   History of Present Illness:          Diagnosis: Type 2 diabetes mellitus, date of diagnosis:   1992       Past history:  He was initially treated with metformin and at some point also glipizide. Apparently he was started on insulin in 1994 approximately because of poor control He has been on various insulin regimens over the last several years However even with insulin he has had poor control for at least the last 7 or 8 years. He does not know what his previous A1c levels have been. He had been continued on metformin and glipizide but metformin stopped because of kidney function abnormality He had been taking Lantus 60 units twice a day with NovoLog previously and also Before his initial consultation in 6/15 he was on NovoLog twice a day and Humalog mix insulin  Because of poor control and large insulin doses he was started on Victoza in 6/15  Since 04/28/14 he had been on a Medtronic insulin pump because of persistent poor control and high insulin requirement  Recent history:   Non-insulin hypoglycemic drugs the patient is taking are: VICTOZA 1.2 mg daily  INSULIN regimen: Tresiba 36 units daily, NovoLog 8 units at breakfast, 6 units at lunch and  10 units at  dinner   A1c is still at 6.7  Fructosamine : 329 last  Current management, problems and blood sugar patterns: His wife is helping him monitor his blood sugars and administer his insulin doses He has not been seen in the office since last July Since then he has been on dialysis 3 days a week  He appears to be getting low normal or low sugars overnight including a reading of 56 about 2:20 AM about 5 days ago  However overall he is having some difficulty getting his meter to work and is checking very infrequently No readings after meals being checked He says he has lost weight since starting dialysis Do not appear to be eating as much overnight with snacks which he was doing before This is despite not increasing the dose of Victoza to 1.8 as recommended He has only a few readings before dinnertime but not clear why these are high He will take a peanut butter sandwich to eat at lunchtime during dialysis but will not otherwise eat a lunch He will take his usual NovoLog and eat breakfast in the mornings before going for dialysis  Meals usually 12 PM, 7 pm but not consistent  Blood sugars from review of his Contour meter:   PRE-MEAL Fasting Lunch Dinner 2 AM Overall  Glucose range: 76-197  156-193 N = 3    Mean/median:     137   POST-MEAL PC Breakfast PC Lunch PC Dinner  Glucose range:   ??  Mean/median:  Previously:  PRE-MEAL Fasting Lunch Dinner Bedtime Overall for 2 weeks  Glucose range: 59-263 199, 207 78-151    Mean/median:     201   POST-MEAL PC Breakfast PC Lunch PC Dinner  Glucose range:   ?  274  Mean/median:       Self-care:   Meals: 2- 3 meals per day.   breakfast is cheerios or egg and toast.  Will have half sandwich with soup at 1 pm , dinner at  6-7 pm.     Bedtime snack is usually crackers  with milk or have been admitted sandwich         Last  consultation : Most recent: 08/2017      Wt Readings from Last 3 Encounters:  05/26/21 238 lb (108 kg)   04/05/21 248 lb 12.8 oz (112.9 kg)  03/30/21 264 lb (119.7 kg)   Glycemic control:   Lab Results  Component Value Date   HGBA1C 6.7 (H) 05/21/2021   HGBA1C 6.7 (H) 11/10/2020   HGBA1C 6.4 09/02/2020   Lab Results  Component Value Date   MICROALBUR 7.3 (H) 03/05/2019   LDLCALC 38 11/10/2020   CREATININE 6.80 (H) 02/24/2021            Lab Results  Component Value Date   FRUCTOSAMINE 341 (H) 05/21/2021   FRUCTOSAMINE 329 (H) 05/02/2019   FRUCTOSAMINE 295 (H) 12/28/2018   FRUCTOSAMINE 316 (H) 07/17/2018     Other active problems: See review of systems   Allergies as of 05/26/2021       Reactions   Ativan [lorazepam] Anxiety, Other (See Comments)   Pt gets more agitated   Adhesive [tape] Other (See Comments)   blisters   Niacin Rash        Medication List        Accurate as of May 26, 2021  9:46 AM. If you have any questions, ask your nurse or doctor.          acetaminophen 500 MG tablet Commonly known as: TYLENOL Take 1,000 mg by mouth 2 (two) times daily as needed (pain.).   allopurinol 100 MG tablet Commonly known as: ZYLOPRIM Take 1 tablet (100 mg total) by mouth daily.   apixaban 2.5 MG Tabs tablet Commonly known as: Eliquis Take 1 tablet (2.5 mg total) by mouth 2 (two) times daily.   Aranesp (Albumin Free) 60 MCG/ML Soln Generic drug: Darbepoetin Alfa Inject 60 mcg as directed every 30 (thirty) days.   atorvastatin 10 MG tablet Commonly known as: LIPITOR Take 1 tablet (10 mg total) by mouth every evening.   blood glucose meter kit and supplies Kit Dispense based on patient and insurance preference. Use up to four times daily as directed. (FOR ICD-9 250.00, 250.01).   calcitRIOL 0.25 MCG capsule Commonly known as: ROCALTROL Take 1 capsule (0.25 mcg total) by mouth daily.   carvedilol 6.25 MG tablet Commonly known as: COREG Take 6.25 mg by mouth 2 (two) times daily.   cetirizine 10 MG tablet Commonly known as: ZYRTEC Take 10 mg  by mouth daily.   Contour Next EZ w/Device Kit Use to check blood sugar daily   EPOGEN IJ Inject as directed See admin instructions. Every 4 weeks   Fish Oil 1000 MG Caps Take 1 capsule (1,000 mg total) by mouth every morning.   folic acid 1 MG tablet Commonly known as: FOLVITE Take 1 mg by mouth in the morning.   folic acid-vitamin b complex-vitamin c-selenium-zinc 3 MG Tabs tablet  Take 1 tablet by mouth every evening.   glucose blood test strip Use Contour test strips as instructed to check blood sugar four times daily.   hydrALAZINE 100 MG tablet Commonly known as: APRESOLINE Take 1 tablet (100 mg total) by mouth 2 (two) times daily.   INSULIN SYRINGE 1CC/31GX5/16" 31G X 5/16" 1 ML Misc 1 each by Does not apply route 3 (three) times daily. Use insulin syringe to inject insulin three times daily.   iron sucrose in sodium chloride 0.9 % 100 mL Iron Sucrose (Venofer)   isosorbide mononitrate 30 MG 24 hr tablet Commonly known as: IMDUR TAKE 2 TABLETS DAILY. What changed:  how much to take how to take this when to take this   NovoLOG FlexPen 100 UNIT/ML FlexPen Generic drug: insulin aspart Inject 10-12 Units into the skin See admin instructions. Inject 10 units subcutaneously before breakfast and 12 units before supper What changed:  how much to take when to take this additional instructions   pantoprazole 40 MG tablet Commonly known as: PROTONIX Take 1 tablet (40 mg total) by mouth daily.   sertraline 100 MG tablet Commonly known as: ZOLOFT Take 1 tablet (100 mg total) by mouth daily.   sevelamer carbonate 800 MG tablet Commonly known as: RENVELA Take 800 mg by mouth 3 (three) times daily with meals.   Tyler Aas FlexTouch 200 UNIT/ML FlexTouch Pen Generic drug: insulin degludec INJECT 44 UNITS SUBCUTANEOUSLY (UNDER THE SKIN) ONCE A DAY   Victoza 18 MG/3ML Sopn Generic drug: liraglutide Inject 1.8 mg into the skin daily before supper. What changed: how  much to take        Allergies:  Allergies  Allergen Reactions   Ativan [Lorazepam] Anxiety and Other (See Comments)    Pt gets more agitated   Adhesive [Tape] Other (See Comments)    blisters   Niacin Rash    Past Medical History:  Diagnosis Date   Acute cholecystitis s/p lap cholecystectomy 03/05/2017 03/04/2017   Anemia, iron deficiency    Anxiety    Arthritis    Atrial fibrillation with rapid ventricular response (HCC)    BPH (benign prostatic hypertrophy)    CAD (coronary artery disease)    Nonobstructive CAD per cath   Cancer [facility])    Cardiac pacemaker in situ    CHF (congestive heart failure) (HCC)    Chronic ulcer of right foot ([place])    resolved per patient   CKD (chronic kidney disease) stage 4, GFR 15-29 ml/min ([place]) 09/26/2017   CKD (chronic kidney disease), stage III (HCC) secondary to DM and HTN   nephrologist-  [name]   Coronary artery disease involving native coronary artery of native heart without angina pectoris    Depression    Diastolic dysfunction    Dyspnea    with exertion, no oxygen   Dysrhythmia    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 ([place]) 09/04/2017   Hearing loss    wears hearing aids   History of cellulitis    right great toe 10-25-2014   History of skin cancer    HOCM (hypertrophic obstructive cardiomyopathy) (HCC)    HOH (hard of hearing)    Hyperlipidemia associated with type 2 diabetes mellitus ([place]) 10/25/2013   Hypertension    Hypertension associated with diabetes ([place]) 10/25/2013   IDDM (insulin dependent diabetes mellitus) - on insulin pump 03/04/2017   Insulin dependent type 2 diabetes mellitus ([place]) 1991   followd by [name]--  has insulin pump  Insulin pump in place    Patient does not have insulin pump - removed approx 2 yrs ago 2019   Memory loss    mild per patient   OSA on CPAP    Pacemaker 06/03/2015   Medtronic   Peripheral neuropathy    severe - feet/lower legs   Peripheral  vascular disease (HCC)    bilateral lower extremities   PVD (peripheral vascular disease) ([place])    Rib fracture 07/24/2015   Seasonal and perennial allergic rhinitis 10/25/2013   Seborrheic keratosis 09/19/2016   Secondary hyperparathyroidism of renal origin ([place])    Sinus node arrhythmia 06/03/2015   Sinus node dysfunction ([place])    Sleep apnea    Syncope 07/24/2015   [place] as ambulation aid     Past Surgical History:  Procedure Laterality Date   AMPUTATION OF REPLICATED TOES  Mar 6384   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)   AMPUTATION TOE Right 03/12/2015   Procedure: RIGHT HALLUS AMPUTATION ;  Surgeon: [name], MD;  Location: [place];  Service: Podiatry;  Laterality: Right;   AV FISTULA PLACEMENT Left 04/16/2020   Procedure: CREATION OF LEFT Brachial/Cephalic Arteriovenous Fistula.;  Surgeon: Cherre Robins, MD;  Location: [place];  Service: Vascular;  Laterality: Left;   [place] Left 08/06/2020   Procedure: BRANCH LIGATION OF LEFT UPPER EXTREMITY ARTERIOVENOUS FISTULA;  Surgeon: Cherre Robins, MD;  Location: [place];  Service: Vascular;  Laterality: Left;   CARDIAC CATHETERIZATION  11-25-2010   [place], [place]   Nonobstructive CAD   CARDIAC PACEMAKER PLACEMENT  Nov 2009   Medtronic   CHOLECYSTECTOMY N/A 03/05/2017   Procedure: LAPAROSCOPIC CHOLECYSTECTOMY WITH INTRAOPERATIVE CHOLANGIOGRAM;  Surgeon: [name], MD;  Location: [facility];  Service: General;  Laterality: N/A;   EP IMPLANTABLE DEVICE N/A 06/03/2015   Procedure: PPM Generator Changeout;  Surgeon: [name], MD;  Location: [place] [facility];  Service: Cardiovascular;  Laterality: N/A;   EXCISION BONE CYST Right 03/06/2015   Procedure: BONE BIOPSIES OF RIGHT FOOT;  Surgeon: [name], MD;  Location: [place];  Service: Podiatry;  Laterality: Right;   EYE SURGERY Bilateral    KNEE ARTHROSCOPY     ORIF ANKLE FRACTURE Left 11/06/2014   Procedure: OPEN  REDUCTION INTERNAL FIXATION (ORIF) LEFT  ANKLE FRACTURE;  Surgeon: [name], MD;  Location: [place];  Service: Orthopedics;  Laterality: Left;   REVISON OF ARTERIOVENOUS FISTULA Left 02/24/2021   Procedure: REVISION OF LEFT ARTERIOVENOUS FISTULA with INTERPOSITION GRAFT USING ARTEGRAFT COLLAGEN GRAFT;  Surgeon: Cherre Robins, MD;  Location: [place];  Service: Vascular;  Laterality: Left;   TEE WITHOUT CARDIOVERSION N/A 09/01/2017   Procedure: TRANSESOPHAGEAL ECHOCARDIOGRAM (TEE);  Surgeon: [name], MD;  [facility];  Service: Cardiovascular;  Laterality: N/A;   TOTAL KNEE ARTHROPLASTY     patient denies this surgery   VEIN LIGATION AND STRIPPING      Family History  Problem Relation Age of Onset   Cancer Mother        breast   Heart attack Father    Stroke Father     Social History:  reports that he quit smoking about 37 years ago. His smoking use included cigarettes. He has a 60.00 pack-year smoking history. He has never used smokeless tobacco. He reports that he does not currently use alcohol. He reports that he does not use drugs.    Review of Systems  Lipids: Had been on and off Lipitor since late 2014 and is now taking 10 mg.   LDL is below 70 Previously had high ALT with Lipitor but now doing well with 10 mg prescribed by his PCP  HDL is consistently low  He is taking OTC  fish oil with improvement in triglycerides Also taking cholestyramine for other reasons    Lab Results  Component Value Date   CHOL 86 11/10/2020   CHOL 118 12/25/2019   CHOL 108 03/01/2019   Lab Results  Component Value Date   HDL 21.00 (L) 11/10/2020   HDL 23.60 (L) 12/25/2019   HDL 22.10 (L) 03/01/2019   Lab Results  Component Value Date   LDLCALC 38 11/10/2020   LDLCALC 73 01/06/2019   LDLCALC 62 07/17/2018   Lab Results  Component Value Date   TRIG 137.0 11/10/2020   TRIG 217.0 (H) 12/25/2019   TRIG 255.0 (H) 03/01/2019   Lab Results   Component Value Date   CHOLHDL 4 11/10/2020   CHOLHDL 5 12/25/2019   CHOLHDL 5 03/01/2019   Lab Results  Component Value Date   LDLDIRECT 45.0 12/25/2019   LDLDIRECT 37.0 03/01/2019   LDLDIRECT 50.0 12/27/2017    Lab Results  Component Value Date   ALT 16 11/10/2020       HYPERTENSION: The blood pressure has been treated  by other physicians. He thinks blood pressure may be low after dialysis  BP Readings from Last 3 Encounters:  05/26/21 (!) 145/75  04/07/21 110/60  04/05/21 (!) 102/50     Chronic kidney disease: On dialysis since 11/2020  Creatinine levels :  Lab Results  Component Value Date   CREATININE 6.80 (H) 02/24/2021   CREATININE 3.99 (H) 12/03/2020   CREATININE 4.00 (H) 11/05/2020         Has history of significant loss of sensation in his feet  since about 2013  He has been prescribed diabetic shoes He is  under the care of a podiatrist, recently regular  Vitamin B12 deficiency: managed with monthly injections  Lab Results  Component Value Date   VITAMINB12 500 03/06/2019   Has anemia of kidney disease, fairly severe and is complaining of cold intolerance    Physical Examination:  BP (!) 145/75    Wt 238 lb (108 kg)    BMI 28.97 kg/m     ASSESSMENT:  Diabetes type 2, with obesity  See history of present illness for detailed discussion of current diabetes management, blood sugar patterns and problems identified  He is on basal bolus insulin injections and Victoza   A1c is 6.7 again Fructosamine relatively high at 341 higher   His blood sugars are relatively lower fasting probably because of his not eating as many snacks at night However blood sugars appear to be mostly high at dinnertime are checked infrequently Not clear if his blood sugars are going up high after dinner also A1c may be falsely low because of his renal failure and anemia    PLAN:  He will be given a new glucose meter He will benefit from freestyle libre or  Dexcom but he refuses to consider this Discussed need to check blood sugars consistently after meals by rotation For now we will reduce Tresiba down to 32 instead of 36 If his blood sugars are consistently over 180 after meals he will let us know and we can adjust his NovoLog accordingly Balanced meals with some protein at all times Stay on 1.2 mg Victoza since  he has lost weight  There are no Patient Instructions on file for this visit.       Elayne Snare 05/26/2021, 9:46 AM   Note: This office note was prepared with Dragon voice recognition system technology. Any transcriptional errors that result from this process are unintentional.

## 2021-05-27 DIAGNOSIS — Z992 Dependence on renal dialysis: Secondary | ICD-10-CM | POA: Diagnosis not present

## 2021-05-27 DIAGNOSIS — E138 Other specified diabetes mellitus with unspecified complications: Secondary | ICD-10-CM | POA: Diagnosis not present

## 2021-05-27 DIAGNOSIS — D509 Iron deficiency anemia, unspecified: Secondary | ICD-10-CM | POA: Diagnosis not present

## 2021-05-27 DIAGNOSIS — N2581 Secondary hyperparathyroidism of renal origin: Secondary | ICD-10-CM | POA: Diagnosis not present

## 2021-05-27 DIAGNOSIS — N186 End stage renal disease: Secondary | ICD-10-CM | POA: Diagnosis not present

## 2021-05-27 DIAGNOSIS — D631 Anemia in chronic kidney disease: Secondary | ICD-10-CM | POA: Diagnosis not present

## 2021-05-28 ENCOUNTER — Telehealth: Payer: Self-pay | Admitting: Endocrinology

## 2021-05-28 DIAGNOSIS — Z794 Long term (current) use of insulin: Secondary | ICD-10-CM

## 2021-05-28 DIAGNOSIS — E1165 Type 2 diabetes mellitus with hyperglycemia: Secondary | ICD-10-CM

## 2021-05-28 MED ORDER — CONTOUR NEXT EZ W/DEVICE KIT: PACK | 0 refills | Status: DC

## 2021-05-28 NOTE — Telephone Encounter (Signed)
Rx sent 

## 2021-05-28 NOTE — Telephone Encounter (Signed)
Pharmacy [facility]) called stating them are unable to provide the below:  Blood Glucose Monitoring Suppl (CONTOUR NEXT EZ) w/Device KIT  Please resubmit [facility]  [id number] [address]:  [phone] Fax:  [phone]

## 2021-05-29 DIAGNOSIS — N186 End stage renal disease: Secondary | ICD-10-CM | POA: Diagnosis not present

## 2021-05-29 DIAGNOSIS — Z992 Dependence on renal dialysis: Secondary | ICD-10-CM | POA: Diagnosis not present

## 2021-05-29 DIAGNOSIS — D509 Iron deficiency anemia, unspecified: Secondary | ICD-10-CM | POA: Diagnosis not present

## 2021-05-29 DIAGNOSIS — E138 Other specified diabetes mellitus with unspecified complications: Secondary | ICD-10-CM | POA: Diagnosis not present

## 2021-05-29 DIAGNOSIS — N2581 Secondary hyperparathyroidism of renal origin: Secondary | ICD-10-CM | POA: Diagnosis not present

## 2021-05-29 DIAGNOSIS — D631 Anemia in chronic kidney disease: Secondary | ICD-10-CM | POA: Diagnosis not present

## 2021-05-31 ENCOUNTER — Other Ambulatory Visit: Payer: Self-pay

## 2021-05-31 ENCOUNTER — Ambulatory Visit (INDEPENDENT_AMBULATORY_CARE_PROVIDER_SITE_OTHER): Payer: Medicare Other | Admitting: Family Medicine

## 2021-05-31 ENCOUNTER — Encounter: Payer: Self-pay | Admitting: Family Medicine

## 2021-05-31 ENCOUNTER — Other Ambulatory Visit: Payer: Self-pay | Admitting: Endocrinology

## 2021-05-31 VITALS — BP 104/55 | HR 105 | Temp 97.9°F | Wt 253.0 lb

## 2021-05-31 DIAGNOSIS — G4700 Insomnia, unspecified: Secondary | ICD-10-CM

## 2021-05-31 DIAGNOSIS — I953 Hypotension of hemodialysis: Secondary | ICD-10-CM | POA: Diagnosis not present

## 2021-05-31 DIAGNOSIS — N186 End stage renal disease: Secondary | ICD-10-CM | POA: Diagnosis not present

## 2021-05-31 DIAGNOSIS — Z992 Dependence on renal dialysis: Secondary | ICD-10-CM

## 2021-05-31 DIAGNOSIS — E1165 Type 2 diabetes mellitus with hyperglycemia: Secondary | ICD-10-CM

## 2021-05-31 DIAGNOSIS — E1169 Type 2 diabetes mellitus with other specified complication: Secondary | ICD-10-CM

## 2021-05-31 DIAGNOSIS — M792 Neuralgia and neuritis, unspecified: Secondary | ICD-10-CM

## 2021-05-31 MED ORDER — MIDODRINE HCL 2.5 MG PO TABS: 2.5000 mg | ORAL_TABLET | ORAL | 2 refills | Status: DC

## 2021-05-31 MED ORDER — GABAPENTIN 300 MG PO CAPS: 300.0000 mg | ORAL_CAPSULE | ORAL | 3 refills | Status: AC

## 2021-05-31 NOTE — Progress Notes (Signed)
° °  David [name] is a 81 y.o. male who presents today for an office visit.  Assessment/Plan:   Chronic Problems Addressed Today: Insomnia We will be starting gabapentin 300mg  three times weekly after dialysis for neuropathic pain.  This should help some with his sleep.  He will check with me in a couple weeks via MyChart.  He has tried adjusting positioning as well which seems to help as well.  May consider trial of low-dose trazodone if not improving with above.  ESRD on dialysis [facility]) Follows with nephrology.  He is having some dialysis induced hypotension.  We will start low-dose midodrine.  Discussed potential side effects.  They will follow-up with me in a few weeks via MyChart.  T2DM (type 2 diabetes mellitus) ([place]) Follows with endocrinology.  Neuropathic pain We will be starting gabapentin 300 mg 3 times weekly after dialysis.  Discussed potential side effects.  Intra-dialytic hypotension His nephrologist recommended midodrine.  This is reasonable.  We will start 2.5 mg 3 times weekly before dialysis.  Discussed potential side effects.  They will follow-up in a few weeks via MyChart.     Subjective:  HPI:  See A/P for status of chronic conditions.  Patient has a couple of issues he would like to discuss today.  Has been having neuropathic pain in his upper extremities.  His nephrologist recommended that he start gabapentin.  Ongoing issue.  Seems to be worsening.  He also has noticed he feels very tired at the end of his dialysis session.  His blood pressure will drop.  His nephrologist recommended starting midodrine before dialysis.  He has also had ongoing issues with difficulty falling asleep for the past several weeks to months.  No specific treatments tried.        Objective:  Physical Exam: BP (!) 104/55    Pulse (!) 105    Temp 97.9 F (36.6 C) (Temporal)    Wt 253 lb (114.8 kg)    SpO2 96%    BMI 30.80 kg/m   Gen: No acute distress, resting  comfortably CV: Regular rate and rhythm with no murmurs appreciated Pulm: Normal work of breathing, clear to auscultation bilaterally with no crackles, wheezes, or rhonchi Neuro: Grossly normal, moves all extremities Psych: Normal affect and thought content      Jovanni Eckhart M. [name], MD 05/31/2021 2:29 PM

## 2021-05-31 NOTE — Assessment & Plan Note (Signed)
We will be starting gabapentin 300 mg 3 times weekly after dialysis.  Discussed potential side effects.

## 2021-05-31 NOTE — Patient Instructions (Signed)
It was very nice to see you today!  We will start gabapentin and midodrine.  Please send me a message in a couple of weeks to let me know how this is working.  Take care, Dr [name]  PLEASE NOTE:  If you had any lab tests please let us know if you have not heard back within a few days. You may see your results on mychart before we have a chance to review them but we will give you a call once they are reviewed by [name]. If we ordered any referrals today, please let us know if you have not heard from their office within the next week.   Please try these tips to maintain a healthy lifestyle:  Eat at least 3 REAL meals and 1-2 snacks per day.  Aim for no more than 5 hours between eating.  If you eat breakfast, please do so within one hour of getting up.   Each meal should contain half fruits/vegetables, one quarter protein, and one quarter carbs (no bigger than a computer mouse)  Cut down on sweet beverages. This includes juice, soda, and sweet tea.   Drink at least 1 glass of water with each meal and aim for at least 8 glasses per day  Exercise at least 150 minutes every week.

## 2021-05-31 NOTE — Assessment & Plan Note (Signed)
Follows with endocrinology ?

## 2021-05-31 NOTE — Assessment & Plan Note (Signed)
Follows with nephrology.  He is having some dialysis induced hypotension.  We will start low-dose midodrine.  Discussed potential side effects.  They will follow-up with me in a few weeks via MyChart.

## 2021-05-31 NOTE — Assessment & Plan Note (Signed)
His nephrologist recommended midodrine.  This is reasonable.  We will start 2.5 mg 3 times weekly before dialysis.  Discussed potential side effects.  They will follow-up in a few weeks via MyChart.

## 2021-05-31 NOTE — Assessment & Plan Note (Addendum)
We will be starting gabapentin 300mg  three times weekly after dialysis for neuropathic pain.  This should help some with his sleep.  He will check with me in a couple weeks via MyChart.  He has tried adjusting positioning as well which seems to help as well.  May consider trial of low-dose trazodone if not improving with above.

## 2021-06-01 DIAGNOSIS — E138 Other specified diabetes mellitus with unspecified complications: Secondary | ICD-10-CM | POA: Diagnosis not present

## 2021-06-01 DIAGNOSIS — N2581 Secondary hyperparathyroidism of renal origin: Secondary | ICD-10-CM | POA: Diagnosis not present

## 2021-06-01 DIAGNOSIS — N186 End stage renal disease: Secondary | ICD-10-CM | POA: Diagnosis not present

## 2021-06-01 DIAGNOSIS — D631 Anemia in chronic kidney disease: Secondary | ICD-10-CM | POA: Diagnosis not present

## 2021-06-01 DIAGNOSIS — D509 Iron deficiency anemia, unspecified: Secondary | ICD-10-CM | POA: Diagnosis not present

## 2021-06-01 DIAGNOSIS — Z992 Dependence on renal dialysis: Secondary | ICD-10-CM | POA: Diagnosis not present

## 2021-06-02 ENCOUNTER — Ambulatory Visit (INDEPENDENT_AMBULATORY_CARE_PROVIDER_SITE_OTHER): Payer: Medicare Other

## 2021-06-02 DIAGNOSIS — I495 Sick sinus syndrome: Secondary | ICD-10-CM

## 2021-06-03 DIAGNOSIS — D509 Iron deficiency anemia, unspecified: Secondary | ICD-10-CM | POA: Diagnosis not present

## 2021-06-03 DIAGNOSIS — N2581 Secondary hyperparathyroidism of renal origin: Secondary | ICD-10-CM | POA: Diagnosis not present

## 2021-06-03 DIAGNOSIS — N186 End stage renal disease: Secondary | ICD-10-CM | POA: Diagnosis not present

## 2021-06-03 DIAGNOSIS — Z992 Dependence on renal dialysis: Secondary | ICD-10-CM | POA: Diagnosis not present

## 2021-06-03 DIAGNOSIS — D631 Anemia in chronic kidney disease: Secondary | ICD-10-CM | POA: Diagnosis not present

## 2021-06-03 DIAGNOSIS — E138 Other specified diabetes mellitus with unspecified complications: Secondary | ICD-10-CM | POA: Diagnosis not present

## 2021-06-05 ENCOUNTER — Emergency Department (HOSPITAL_COMMUNITY): Payer: Medicare Other

## 2021-06-05 ENCOUNTER — Emergency Department (HOSPITAL_COMMUNITY)
Admission: EM | Admit: 2021-06-05 | Discharge: 2021-06-05 | Disposition: A | Discharge status: Home / Self Care | Payer: Medicare Other | Attending: Emergency Medicine | Admitting: Emergency Medicine

## 2021-06-05 DIAGNOSIS — Z950 Presence of cardiac pacemaker: Secondary | ICD-10-CM | POA: Insufficient documentation

## 2021-06-05 DIAGNOSIS — Z20822 Contact with and (suspected) exposure to covid-19: Secondary | ICD-10-CM | POA: Diagnosis not present

## 2021-06-05 DIAGNOSIS — R55 Syncope and collapse: Secondary | ICD-10-CM | POA: Diagnosis not present

## 2021-06-05 DIAGNOSIS — Z992 Dependence on renal dialysis: Secondary | ICD-10-CM | POA: Insufficient documentation

## 2021-06-05 DIAGNOSIS — Z7901 Long term (current) use of anticoagulants: Secondary | ICD-10-CM | POA: Insufficient documentation

## 2021-06-05 DIAGNOSIS — E1122 Type 2 diabetes mellitus with diabetic chronic kidney disease: Secondary | ICD-10-CM | POA: Insufficient documentation

## 2021-06-05 DIAGNOSIS — E138 Other specified diabetes mellitus with unspecified complications: Secondary | ICD-10-CM | POA: Diagnosis not present

## 2021-06-05 DIAGNOSIS — R29810 Facial weakness: Secondary | ICD-10-CM | POA: Diagnosis not present

## 2021-06-05 DIAGNOSIS — R778 Other specified abnormalities of plasma proteins: Secondary | ICD-10-CM | POA: Diagnosis not present

## 2021-06-05 DIAGNOSIS — R4020 Unspecified coma: Secondary | ICD-10-CM | POA: Diagnosis not present

## 2021-06-05 DIAGNOSIS — I120 Hypertensive chronic kidney disease with stage 5 chronic kidney disease or end stage renal disease: Secondary | ICD-10-CM | POA: Diagnosis not present

## 2021-06-05 DIAGNOSIS — N186 End stage renal disease: Secondary | ICD-10-CM | POA: Insufficient documentation

## 2021-06-05 DIAGNOSIS — T82599A Other mechanical complication of unspecified cardiac and vascular devices and implants, initial encounter: Secondary | ICD-10-CM | POA: Diagnosis not present

## 2021-06-05 DIAGNOSIS — Z743 Need for continuous supervision: Secondary | ICD-10-CM | POA: Diagnosis not present

## 2021-06-05 DIAGNOSIS — D509 Iron deficiency anemia, unspecified: Secondary | ICD-10-CM | POA: Diagnosis not present

## 2021-06-05 DIAGNOSIS — Z794 Long term (current) use of insulin: Secondary | ICD-10-CM | POA: Insufficient documentation

## 2021-06-05 DIAGNOSIS — R404 Transient alteration of awareness: Secondary | ICD-10-CM | POA: Diagnosis not present

## 2021-06-05 DIAGNOSIS — N2581 Secondary hyperparathyroidism of renal origin: Secondary | ICD-10-CM | POA: Diagnosis not present

## 2021-06-05 DIAGNOSIS — D631 Anemia in chronic kidney disease: Secondary | ICD-10-CM | POA: Diagnosis not present

## 2021-06-05 LAB — CBC WITH DIFFERENTIAL/PLATELET
Abs Immature Granulocytes: 0.11 10*3/uL — ABNORMAL HIGH (ref 0.00–0.07)
Basophils Absolute: 0 10*3/uL (ref 0.0–0.1)
Basophils Relative: 1 %
Eosinophils Absolute: 0.1 10*3/uL (ref 0.0–0.5)
Eosinophils Relative: 1 %
HCT: 33.5 % — ABNORMAL LOW (ref 39.0–52.0)
Hemoglobin: 10.8 g/dL — ABNORMAL LOW (ref 13.0–17.0)
Immature Granulocytes: 1 %
Lymphocytes Relative: 7 %
Lymphs Abs: 0.6 10*3/uL — ABNORMAL LOW (ref 0.7–4.0)
MCH: 32.6 pg (ref 26.0–34.0)
MCHC: 32.2 g/dL (ref 30.0–36.0)
MCV: 101.2 fL — ABNORMAL HIGH (ref 80.0–100.0)
Monocytes Absolute: 0.8 10*3/uL (ref 0.1–1.0)
Monocytes Relative: 9 %
Neutro Abs: 7 10*3/uL (ref 1.7–7.7)
Neutrophils Relative %: 81 %
Platelets: 199 10*3/uL (ref 150–400)
RBC: 3.31 MIL/uL — ABNORMAL LOW (ref 4.22–5.81)
RDW: 16.2 % — ABNORMAL HIGH (ref 11.5–15.5)
WBC: 8.7 10*3/uL (ref 4.0–10.5)
nRBC: 0 % (ref 0.0–0.2)

## 2021-06-05 LAB — BASIC METABOLIC PANEL
Anion gap: 14 (ref 5–15)
BUN: 38 mg/dL — ABNORMAL HIGH (ref 8–23)
CO2: 30 mmol/L (ref 22–32)
Calcium: 9.1 mg/dL (ref 8.9–10.3)
Chloride: 93 mmol/L — ABNORMAL LOW (ref 98–111)
Creatinine, Ser: 5.76 mg/dL — ABNORMAL HIGH (ref 0.61–1.24)
GFR, Estimated: 9 mL/min — ABNORMAL LOW (ref >60)
Glucose, Bld: 150 mg/dL — ABNORMAL HIGH (ref 70–99)
Potassium: 4.4 mmol/L (ref 3.5–5.1)
Sodium: 137 mmol/L (ref 135–145)

## 2021-06-05 LAB — RESP PANEL BY RT-PCR (FLU A&B, COVID) ARPGX2
Influenza A by PCR: NEGATIVE
Influenza B by PCR: NEGATIVE
SARS Coronavirus 2 by RT PCR: NEGATIVE

## 2021-06-05 LAB — TROPONIN I (HIGH SENSITIVITY)
Troponin I (High Sensitivity): 61 ng/L — ABNORMAL HIGH (ref <18)
Troponin I (High Sensitivity): 63 ng/L — ABNORMAL HIGH (ref <18)

## 2021-06-05 NOTE — ED Notes (Signed)
Pt given a happy meal per EDP approval

## 2021-06-05 NOTE — ED Notes (Signed)
Pacemaker interrogated. 

## 2021-06-05 NOTE — ED Provider Notes (Signed)
[name] Provider Note   CSN: [id number] Arrival date & time: 06/05/21  1421     History  No chief complaint on file.   [name] is a 81 y.o. male.  Patient is an 81 year old male who presents after syncopal episode.  Per chart review, he has a history of end-stage renal disease on dialysis, diabetes, hypertension, and atrial fibrillation on Eliquis.  He has a pacemaker.  Per EMS, patient was at dialysis and was in his third hour of dialysis when he had a syncopal episode.  Reportedly he had syncopized for about 5 minutes.  There is no witnessed seizure activity.  No associated trauma.  He was confused with EMS.  On ED arrival, he is more responsive.  He had an episode of vomiting with EMS.  He was given Zofran.  He was not hypotensive on EMS arrival.  He is noted to have some left facial droop.  Reportedly this is chronic.  He does not have any recollection of the events.  His wife is at bedside.  She states that he was fine when she dropped him off.  He does have some short-term memory loss and some left side mild deficits from the prior stroke.  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  He was recently supposed to be started on midodrine for this.  Per his wife, he took his first dose on Thursday but he got anxious on this like an anxiety attack per the wife during the dialysis session and had to finish early.  He did not take it today before this dialysis session.  The wife is going to contact the provider on Monday to receive further instructions on continuing this.      Home Medications Prior to Admission medications   Medication Sig Start Date End Date Taking? Authorizing Provider  acetaminophen (TYLENOL) 500 MG tablet Take 1,000 mg by mouth 2 (two) times daily as needed (pain.).    [provider]  apixaban (ELIQUIS) 2.5 MG TABS tablet Take 1 tablet (2.5 mg total) by mouth 2 (two) times daily. 03/16/21   Sherran Needs, NP  atorvastatin (LIPITOR) 10 MG tablet Take 1 tablet (10 mg total) by mouth every evening. 02/23/21   [name], MD  blood glucose meter kit and supplies KIT Dispense based on patient and insurance preference. Use up to four times daily as directed. (FOR ICD-9 250.00, 250.01). 01/12/19   Elodia Florence., MD  Blood Glucose Monitoring Suppl (CONTOUR NEXT EZ) w/Device KIT Use to check blood sugar daily 05/28/21   Elayne Snare, MD  calcitRIOL (ROCALTROL) 0.25 MCG capsule Take 1 capsule (0.25 mcg total) by mouth daily. 12/16/20   [name], MD  carvedilol (COREG) 6.25 MG tablet Take 6.25 mg by mouth 2 (two) times daily. 03/17/21   [provider]  cetirizine (ZYRTEC) 10 MG tablet Take by mouth. 04/27/21   [provider]  Darbepoetin Alfa (ARANESP, ALBUMIN FREE,) 60 MCG/ML SOLN Inject 60 mcg as directed every 30 (thirty) days.    [provider]  Epoetin Alfa (EPOGEN IJ) Inject as directed See admin instructions. Every 4 weeks    [provider]  folic acid (FOLVITE) 1 MG tablet Take 1 mg by mouth in the morning. Patient not taking: Reported on 05/26/2021    [provider]  folic acid-vitamin b complex-vitamin c-selenium-zinc (DIALYVITE) 3 MG TABS tablet Take 1 tablet by mouth every evening.  [provider]  gabapentin (NEURONTIN) 300 MG capsule Take 1 capsule (300 mg total) by mouth 3 (three) times a week. After dialysis 05/31/21   [name], MD  glucose blood test strip Use Contour test strips as instructed to check blood sugar four times daily. 10/25/19   Elayne Snare, MD  insulin aspart (NOVOLOG FLEXPEN) 100 UNIT/ML FlexPen Inject 10-12 Units into the skin See admin instructions. Inject 10 units subcutaneously before breakfast and 12 units before supper Patient taking differently: Inject 8 Units into the skin in the morning and at bedtime. 12/21/20   Elayne Snare, MD  Insulin Syringe-Needle U-100 (INSULIN SYRINGE  1CC/31GX5/16") 31G X 5/16" 1 ML MISC 1 each by Does not apply route 3 (three) times daily. Use insulin syringe to inject insulin three times daily. 01/08/19   Elayne Snare, MD  iron sucrose in sodium chloride 0.9 % 100 mL Iron Sucrose (Venofer) 03/25/21 03/17/22  [provider]  midodrine (PROAMATINE) 2.5 MG tablet Take 1 tablet (2.5 mg total) by mouth 3 (three) times a week. Before dialysis 05/31/21   [name], MD  Omega-3 Fatty Acids (FISH OIL) 1000 MG CAPS Take 1 capsule (1,000 mg total) by mouth every morning. 03/06/19   Garvin Fila, MD  pantoprazole (PROTONIX) 40 MG tablet Take 1 tablet (40 mg total) by mouth daily. 05/25/21   [name], MD  sertraline (ZOLOFT) 100 MG tablet Take 1 tablet (100 mg total) by mouth daily. 12/16/20   [name], MD  sevelamer carbonate (RENVELA) 800 MG tablet Take 800 mg by mouth 3 (three) times daily with meals.    [provider]  TRESIBA FLEXTOUCH 200 UNIT/ML FlexTouch Pen INJECT 44 UNITS SUBCUTANEOUSLY (UNDER THE SKIN) ONCE A DAY Patient taking differently: No sig reported 05/25/21   Elayne Snare, MD  VICTOZA 18 MG/3ML SOPN INJECT 1.8MG UNDER THE SKIN DAILY BEFORE SUPPER 05/31/21   Elayne Snare, MD      Allergies    Ativan [lorazepam], Adhesive [tape], and Niacin    Review of Systems   Review of Systems  Constitutional:  Positive for fatigue. Negative for chills, diaphoresis and fever.  HENT:  Negative for congestion, rhinorrhea and sneezing.   Eyes: Negative.   Respiratory:  Negative for cough, chest tightness and shortness of breath.   Cardiovascular:  Negative for chest pain and leg swelling.  Gastrointestinal:  Positive for vomiting. Negative for abdominal pain, blood in stool, diarrhea and nausea.  Genitourinary:  Negative for difficulty urinating, flank pain, frequency and hematuria.  Musculoskeletal:  Negative for arthralgias and back pain.  Skin:  Negative for rash.  Neurological:  Positive for syncope. Negative  for dizziness, speech difficulty, weakness, numbness and headaches.   Physical Exam Updated Vital Signs BP (!) 145/88    Pulse (!) 104    Temp 98 F (36.7 C)    Resp (!) 29    SpO2 100%  Physical Exam Constitutional:      Appearance: He is well-developed.  HENT:     Head: Normocephalic and atraumatic.  Eyes:     Pupils: Pupils are equal, round, and reactive to light.  Cardiovascular:     Rate and Rhythm: Normal rate and regular rhythm.     Heart sounds: Normal heart sounds.  Pulmonary:     Effort: Pulmonary effort is normal. No respiratory distress.     Breath sounds: Normal breath sounds. No wheezing or rales.  Chest:     Chest wall: No tenderness.  Abdominal:     General: Bowel sounds are normal.     Palpations: Abdomen is soft.     Tenderness: There is no abdominal tenderness. There is no guarding or rebound.  Musculoskeletal:        General: Normal range of motion.     Cervical back: Normal range of motion and neck supple.  Lymphadenopathy:     Cervical: No cervical adenopathy.  Skin:    General: Skin is warm and dry.     Findings: No rash.  Neurological:     Mental Status: He is alert and oriented to person, place, and time.     Comments: Slight drooping in the left side the face.  There are some slight weakness in the right arm and leg as compared to the left but he has good movement in all extremities    ED Results / Procedures / Treatments   Labs (all labs ordered are listed, but only abnormal results are displayed) Labs Reviewed  BASIC METABOLIC PANEL - Abnormal; Notable for the following components:      Result Value   Chloride 93 (*)    Glucose, Bld 150 (*)    BUN 38 (*)    Creatinine, Ser 5.76 (*)    GFR, Estimated 9 (*)    All other components within normal limits  CBC WITH DIFFERENTIAL/PLATELET - Abnormal; Notable for the following components:   RBC 3.31 (*)    Hemoglobin 10.8 (*)    HCT 33.5 (*)    MCV 101.2 (*)    RDW 16.2 (*)    Lymphs Abs 0.6  (*)    Abs Immature Granulocytes 0.11 (*)    All other components within normal limits  TROPONIN I (HIGH SENSITIVITY) - Abnormal; Notable for the following components:   Troponin I (High Sensitivity) 61 (*)    All other components within normal limits  TROPONIN I (HIGH SENSITIVITY) - Abnormal; Notable for the following components:   Troponin I (High Sensitivity) 63 (*)    All other components within normal limits  RESP PANEL BY RT-PCR (FLU A&B, COVID) [id number]    EKG EKG Interpretation  Date/Time:  Saturday June 05 2021 15:23:12 EST Ventricular Rate:  105 PR Interval:    QRS Duration: 144 QT Interval:  435 QTC Calculation: 575 R Axis:   -69 Text Interpretation: Pacemaker spikes or artifacts Atrial flutter with predominant 2:1 AV block Nonspecific IVCD with LAD Anteroseptal infarct, old Baseline wander in lead(s) V4 Confirmed by Malvin Johns [phone]) on 06/05/2021 3:57:19 PM  Radiology CT Head Wo Contrast  Result Date: 06/05/2021 CLINICAL DATA:  Syncope EXAM: CT HEAD WITHOUT CONTRAST TECHNIQUE: Contiguous axial images were obtained from the base of the skull through the vertex without intravenous contrast. RADIATION DOSE REDUCTION: This exam was performed according to the departmental dose-optimization program which includes automated exposure control, adjustment of the mA and/or kV according to patient size and/or use of iterative reconstruction technique. COMPARISON:  01/09/2019 FINDINGS: Brain: No acute intracranial findings are seen. Cortical sulci are prominent. There is decreased density in the periventricular and subcortical white matter. There is prominence of cisterna magna. No significant interval changes are noted. Vascular: Unremarkable. Skull: Unremarkable. Sinuses/Orbits: Unremarkable. Other: No significant interval changes are noted. IMPRESSION: No acute intracranial findings are seen in noncontrast CT brain. Atrophy. Small-vessel disease. No significant interval changes are  noted. Electronically Signed   By: Elmer Picker [name]   On: 06/05/2021 16:28    Procedures Procedures    Medications  Ordered in ED Medications - No data to display  ED Course/ Medical Decision Making/ A&P                           Medical Decision Making Amount and/or Complexity of Data Reviewed Labs: ordered. Radiology: ordered.   Patient is an 81 year old male who presents after a syncopal episode at dialysis.  He is fully alert and oriented in the ED.  No focal neurologic deficits.  He has some chronic mild left-sided facial drooping and mild left-sided weakness which is unchanged per his wife who is at bedside.  His EKG does not show any ischemic changes.  He is in a likely atrial fibrillation type rhythm.  This was reviewed by me.  His pacemaker was interrogated.  He had no arrhythmias.  He has some ongoing episodes of atrial fibrillation with heart rates up to the low 100s.  But no signs of V. tach, bradycardic rhythms or other concerning findings per the report.  His labs were checked.  He has no concerning findings.  His troponins were mildly elevated but flat.  This is likely from his chronic kidney disease.  He does not have chest pain or other symptoms that would be more concerning for ACS, dissection, aneurysm.  No witnessed seizure activity.  No incontinence.  No recent illnesses.  He had a head CT which showed no acute abnormality.  This was reviewed by me as well.  I am wondering if the episode was caused by hypotension which she is known to have during dialysis.  He started midodrine on his last dialysis session but had an anxiety reaction which he was felt related to that.  So he did not take the medication before this dialysis.  Family was at bedside.  We discussed following up with his primary care physician for recheck.  Also talking to his doctor on Monday regarding continuing the midodrine.  At this point he is back to baseline, his labs are reassuring, his pacemaker  interrogation is reassuring.  I had a discussion with the family regarding hospitalization and feel that he does not have any clear indication at this point.  Family is good with taking him home.  He will have close follow-up with his PCP.  Return precautions were given.  Final Clinical Impression(s) / ED Diagnoses Final diagnoses:  Syncope, unspecified syncope type    Rx / DC Orders ED Discharge Orders     None         Malvin Johns, MD 06/05/21 1942

## 2021-06-05 NOTE — ED Notes (Addendum)
Wife is at bedside.  She states he had a stroke about 5 yrs ago and has had some memory issues since. Mostly short term memory.  She notes the slightest facial droop which seems less obvious to me when smiling but she also states his facial droop is worse when he doesn't feel well.   She also states that he was started on midodrine but Thursday he took it prior to dialysis for the first time and he became very anxious and left dialysis a little early so they did not give midodrine for todays tx.   His glucose at home was 350 which they state is abnormally high.

## 2021-06-05 NOTE — ED Triage Notes (Addendum)
PT BIB [facility] from Dialysis for 5 min sycopal episode w/ AMS after.  Pt came in to dialysis ambulatory and A&O which is his baseline.  No hx of sz or stroke. EMS noted some left sided facial droop but pt could not follow commands well enough to do full stroke assessment.  PT is oriented to self for EMS on arrival. PT had episode of emesis with dialysis and EMS.    EMS gave 4mg  of Zofran.  Pt does have pacemaker.  EMS states he is normally at dialysis for 2hr but was there for 3hrs today.  They were not done with tx but got "more than his normal tx today".  EMs vitals 150/90, 98%, 100HR RR 16, CBG 142.

## 2021-06-05 NOTE — ED Notes (Addendum)
Pt able to answer name, DOB, age and location.  Confused as to circumstances, year, month and president.  Unable to answer month and year 5 min after being told the month and year.

## 2021-06-05 NOTE — ED Notes (Signed)
DC instructions reviewed with pt. PT verbalized understanding. Pt DC °

## 2021-06-06 ENCOUNTER — Other Ambulatory Visit: Payer: Self-pay | Admitting: Endocrinology

## 2021-06-06 DIAGNOSIS — E1165 Type 2 diabetes mellitus with hyperglycemia: Secondary | ICD-10-CM

## 2021-06-06 DIAGNOSIS — Z794 Long term (current) use of insulin: Secondary | ICD-10-CM

## 2021-06-07 ENCOUNTER — Other Ambulatory Visit: Payer: Self-pay

## 2021-06-07 ENCOUNTER — Other Ambulatory Visit: Payer: Self-pay | Admitting: *Deleted

## 2021-06-07 ENCOUNTER — Other Ambulatory Visit: Payer: Medicare Other

## 2021-06-07 ENCOUNTER — Telehealth: Payer: Self-pay

## 2021-06-07 DIAGNOSIS — Z515 Encounter for palliative care: Secondary | ICD-10-CM

## 2021-06-07 LAB — CUP PACEART REMOTE DEVICE CHECK
Battery Remaining Longevity: 27 mo
Battery Voltage: 2.94 V
Brady Statistic RA Percent Paced: 0 %
Brady Statistic RV Percent Paced: 23.71 %
Date Time Interrogation Session: 20230121180200
Implantable Lead Implant Date: 20091102
Implantable Lead Implant Date: 20091102
Implantable Lead Location: 753859
Implantable Lead Location: 753860
Implantable Lead Model: 5076
Implantable Lead Model: 5076
Implantable Pulse Generator Implant Date: 20170118
Lead Channel Impedance Value: 1615 Ohm
Lead Channel Impedance Value: 456 Ohm
Lead Channel Impedance Value: 513 Ohm
Lead Channel Impedance Value: 532 Ohm
Lead Channel Pacing Threshold Amplitude: 0.875 V
Lead Channel Pacing Threshold Amplitude: 1.625 V
Lead Channel Pacing Threshold Pulse Width: 0.4 ms
Lead Channel Pacing Threshold Pulse Width: 0.4 ms
Lead Channel Sensing Intrinsic Amplitude: 13.5 mV
Lead Channel Sensing Intrinsic Amplitude: 13.5 mV
Lead Channel Sensing Intrinsic Amplitude: 2 mV
Lead Channel Sensing Intrinsic Amplitude: 2 mV
Lead Channel Setting Pacing Amplitude: 2.5 V
Lead Channel Setting Pacing Amplitude: 2.5 V
Lead Channel Setting Pacing Pulse Width: 0.4 ms
Lead Channel Setting Sensing Sensitivity: 2.8 mV

## 2021-06-07 NOTE — Telephone Encounter (Signed)
Spoke with Mr [name], stated has medication question. Schedule appointment with PCP tomorrow  Patient wife stated patient was at ED due to syncope when having dialysis done.  Had a panic or anxiety attack on his previews dialysis appointment possible from medication reaction   Requesting lab results from ED if possible.

## 2021-06-07 NOTE — Telephone Encounter (Signed)
We can discuss at their appointment tomorrow morning.  [name]. [name], MD 06/07/2021 1:02 PM

## 2021-06-07 NOTE — Telephone Encounter (Signed)
Spouse called to advise [name] was in the hospital this weekend and would like to discuss his current medication. He is having trouble with his meds and dialysis.

## 2021-06-08 ENCOUNTER — Encounter: Payer: Self-pay | Admitting: Family Medicine

## 2021-06-08 ENCOUNTER — Telehealth (INDEPENDENT_AMBULATORY_CARE_PROVIDER_SITE_OTHER): Payer: Medicare Other | Admitting: Family Medicine

## 2021-06-08 VITALS — BP 138/60 | Wt 251.0 lb

## 2021-06-08 DIAGNOSIS — D509 Iron deficiency anemia, unspecified: Secondary | ICD-10-CM | POA: Diagnosis not present

## 2021-06-08 DIAGNOSIS — M792 Neuralgia and neuritis, unspecified: Secondary | ICD-10-CM

## 2021-06-08 DIAGNOSIS — N2581 Secondary hyperparathyroidism of renal origin: Secondary | ICD-10-CM | POA: Diagnosis not present

## 2021-06-08 DIAGNOSIS — I152 Hypertension secondary to endocrine disorders: Secondary | ICD-10-CM | POA: Diagnosis not present

## 2021-06-08 DIAGNOSIS — N186 End stage renal disease: Secondary | ICD-10-CM | POA: Diagnosis not present

## 2021-06-08 DIAGNOSIS — I953 Hypotension of hemodialysis: Secondary | ICD-10-CM | POA: Diagnosis not present

## 2021-06-08 DIAGNOSIS — E138 Other specified diabetes mellitus with unspecified complications: Secondary | ICD-10-CM | POA: Diagnosis not present

## 2021-06-08 DIAGNOSIS — Z992 Dependence on renal dialysis: Secondary | ICD-10-CM | POA: Diagnosis not present

## 2021-06-08 DIAGNOSIS — E1159 Type 2 diabetes mellitus with other circulatory complications: Secondary | ICD-10-CM | POA: Diagnosis not present

## 2021-06-08 DIAGNOSIS — I5030 Unspecified diastolic (congestive) heart failure: Secondary | ICD-10-CM

## 2021-06-08 DIAGNOSIS — G4700 Insomnia, unspecified: Secondary | ICD-10-CM

## 2021-06-08 DIAGNOSIS — D631 Anemia in chronic kidney disease: Secondary | ICD-10-CM | POA: Diagnosis not present

## 2021-06-08 MED ORDER — MIDODRINE HCL 2.5 MG PO TABS: 1.2500 mg | ORAL_TABLET | ORAL | 2 refills | Status: AC

## 2021-06-08 NOTE — Assessment & Plan Note (Signed)
This is managed by nephrology.  He has stopped taking his amlodipine, Imdur, and hydralazine.  He is now only on Coreg 3.125 mg twice daily.  Discussed with patient this could explain some of his intradialysis hypotension though when he is outside of dialysis his blood pressure is typically on the higher end.  If his hypotension is not managed by midodrine will likely need to stop Coreg completely.

## 2021-06-08 NOTE — Assessment & Plan Note (Signed)
Doing much better with gabapentin 300 mg 3 times weekly after dialysis.

## 2021-06-08 NOTE — Progress Notes (Signed)
° °  [name] is a 81 y.o. male who presents today for a virtual office visit.  Assessment/Plan:  Chronic Problems Addressed Today: Intra-dialytic hypotension Felt like he had increased anxiety with midodrine.  We will cut dose to 1.25 mg 3 times weekly before dialysis.  They will check with me this afternoon after his dialysis to let me know how he does.  If he continues to have issues with increased anxiety we will need to look for alternative management options.  May need to follow-up with his cardiologist if this continues to be an issue.  Hypertension associated with diabetes [facility]) This is managed by nephrology.  He has stopped taking his amlodipine, Imdur, and hydralazine.  He is now only on Coreg 3.125 mg twice daily.  Discussed with patient this could explain some of his intradialysis hypotension though when he is outside of dialysis his blood pressure is typically on the higher end.  If his hypotension is not managed by midodrine will likely need to stop Coreg completely.   Neuropathic pain Doing much better with gabapentin 300 mg 3 times weekly after dialysis.  (HFpEF) heart failure with preserved ejection fraction (HCC) Volume status managed with dialysis.  He will need to follow-up with cardiology soon.     Subjective:  HPI:  Patient here for ED follow up. We saw them about a week ago to discuss intradialysis hypotension.  Their nephrologist had recommended possibly starting midodrine. They took their first dose 5 days ago prior to. He felt like it caused an anxiety attack.  At his most recent dialysis session 3 days ago they did not take the midodrine. He had an episode of hypotension resulting in him losing consciousness.  EMS was called and he was taken to the emergency room.  Work-up there was reassuring.  Pacemaker was interrogated with no significant arrhythmias.  Head CT scan was normal.  He has felt fine the last few days.       Objective/Observations  Physical  Exam: Gen: NAD, resting comfortably Pulm: Normal work of breathing Neuro: Grossly normal, moves all extremities Psych: Normal affect and thought content  Virtual Visit via Video   I connected with [name] on 06/08/21 at 11:00 AM EST by a video enabled telemedicine application and verified that I am speaking with the correct person using two identifiers. The limitations of evaluation and management by telemedicine and the availability of in person appointments were discussed. The patient expressed understanding and agreed to proceed.   Patient location: Home Provider location: [place] participating in the virtual visit: Myself and Patient     [name]. [name], MD 06/08/2021 8:38 AM

## 2021-06-08 NOTE — Assessment & Plan Note (Signed)
Volume status managed with dialysis.  He will need to follow-up with cardiology soon.

## 2021-06-08 NOTE — Assessment & Plan Note (Signed)
Felt like he had increased anxiety with midodrine.  We will cut dose to 1.25 mg 3 times weekly before dialysis.  They will check with me this afternoon after his dialysis to let me know how he does.  If he continues to have issues with increased anxiety we will need to look for alternative management options.  May need to follow-up with his cardiologist if this continues to be an issue.

## 2021-06-09 DIAGNOSIS — Z992 Dependence on renal dialysis: Secondary | ICD-10-CM | POA: Diagnosis not present

## 2021-06-09 DIAGNOSIS — D509 Iron deficiency anemia, unspecified: Secondary | ICD-10-CM | POA: Diagnosis not present

## 2021-06-09 DIAGNOSIS — E138 Other specified diabetes mellitus with unspecified complications: Secondary | ICD-10-CM | POA: Diagnosis not present

## 2021-06-09 DIAGNOSIS — N2581 Secondary hyperparathyroidism of renal origin: Secondary | ICD-10-CM | POA: Diagnosis not present

## 2021-06-09 DIAGNOSIS — D631 Anemia in chronic kidney disease: Secondary | ICD-10-CM | POA: Diagnosis not present

## 2021-06-09 DIAGNOSIS — N186 End stage renal disease: Secondary | ICD-10-CM | POA: Diagnosis not present

## 2021-06-09 NOTE — Telephone Encounter (Signed)
I appreciate the update. I am glad to hear that he did well. I agree with them not taking the midodrine unless he needs to so trying to manage fluids is a good alternative.

## 2021-06-10 DIAGNOSIS — E138 Other specified diabetes mellitus with unspecified complications: Secondary | ICD-10-CM | POA: Diagnosis not present

## 2021-06-10 DIAGNOSIS — D509 Iron deficiency anemia, unspecified: Secondary | ICD-10-CM | POA: Diagnosis not present

## 2021-06-10 DIAGNOSIS — D631 Anemia in chronic kidney disease: Secondary | ICD-10-CM | POA: Diagnosis not present

## 2021-06-10 DIAGNOSIS — Z992 Dependence on renal dialysis: Secondary | ICD-10-CM | POA: Diagnosis not present

## 2021-06-10 DIAGNOSIS — N186 End stage renal disease: Secondary | ICD-10-CM | POA: Diagnosis not present

## 2021-06-10 DIAGNOSIS — N2581 Secondary hyperparathyroidism of renal origin: Secondary | ICD-10-CM | POA: Diagnosis not present

## 2021-06-10 NOTE — Progress Notes (Signed)
COMMUNITY PALLIATIVE CARE RN NOTE  PRIMARY CARE PROVIDER: Dr. Dimas Chyle RESPONSIBLE PARTY:  [name]  PLAN OF CARE and INTERVENTION:  ADVANCE CARE PLANNING/GOALS OF CARE: Goal is to maintain comfort, safety, remain in home and continue dialysis as able. PATIENT/CAREGIVER EDUCATION: Palliative Care services, provided contact information, encouraged to call with questions and concerns. DISEASE STATUS: RN Palliative care telephonic encounter completed. Spoke with patient's wife, David [name]. Patient recently in ED due to a syncopal episode. Patient recently started on midodrine and questioned if this was the cause of patient's syncope. Patient is having difficulty completing his dialysis. He had to leave early last week due to severe anxiety. [name] shared that over the past 2-3 weeks, patient has been voicing that he is getting tired and knows that he is going to die soon. [facility] [name] voiced support of patient in what he decides. Patient denies any pain currently. Scheduled visit with Palliative NP.    HISTORY OF PRESENT ILLNESS: This is a 81 year old male residing at home with his wife. Diagnosis includes but not be limited to ESRD, frontal lobe CVA, PVD,  obstructive hypertrophic cardiomyopathy. Palliative care team to continue to follow to support with goals of care and complex decision making.  CODE STATUS: DNR MOST FORM: No PPS: 50%  PHYSICAL EXAM: Deferred     (Duration of Visit and documentation -40 minutes)

## 2021-06-11 ENCOUNTER — Other Ambulatory Visit: Payer: Medicare Other | Admitting: Family Medicine

## 2021-06-11 ENCOUNTER — Encounter: Payer: Self-pay | Admitting: Family Medicine

## 2021-06-11 ENCOUNTER — Other Ambulatory Visit: Payer: Self-pay

## 2021-06-11 VITALS — BP 120/60 | HR 112 | Temp 97.8°F | Resp 18 | Wt 251.0 lb

## 2021-06-11 DIAGNOSIS — F4323 Adjustment disorder with mixed anxiety and depressed mood: Secondary | ICD-10-CM | POA: Diagnosis not present

## 2021-06-11 DIAGNOSIS — Z515 Encounter for palliative care: Secondary | ICD-10-CM | POA: Diagnosis not present

## 2021-06-11 DIAGNOSIS — Z9181 History of falling: Secondary | ICD-10-CM

## 2021-06-11 DIAGNOSIS — N186 End stage renal disease: Secondary | ICD-10-CM | POA: Diagnosis not present

## 2021-06-11 DIAGNOSIS — R5381 Other malaise: Secondary | ICD-10-CM

## 2021-06-11 DIAGNOSIS — I953 Hypotension of hemodialysis: Secondary | ICD-10-CM | POA: Diagnosis not present

## 2021-06-11 DIAGNOSIS — Z992 Dependence on renal dialysis: Secondary | ICD-10-CM

## 2021-06-11 NOTE — Progress Notes (Signed)
[facility] Palliative Care Consult Note Telephone: [phone]  Fax: [phone]   Date of encounter: 06/11/21 2:15 PM PATIENT NAME: David [name] [address] [place] [address]   [phone] (home)  DOB: [birth year] MRN: [id number] PRIMARY CARE PROVIDER:    [name], MD,  [address] [place] [address] [phone]  REFERRING PROVIDER:   [name], [place],   [id number] [phone]  RESPONSIBLE PARTY:    Contact Information     Name Relation Home Work [place] Spouse [phone]  [phone]   Manjinder, [place] [phone]  [phone]   Erastus, Bartolomei Daughter   [phone]        I met face to face with patient and wife, [name] in their home. Palliative Care was asked to follow this patient by consultation request of  [name], MD to address advance care planning and complex medical decision making. This is the initial visit.          ASSESSMENT, SYMPTOM MANAGEMENT AND PLAN / RECOMMENDATIONS:   Palliative Care Encounter-Discussed options for MOST. Also addressed that pt would likely qualify for Hospice Services if stopping dialysis. Follow up after pt has had time to discuss MOST choices with family to help complete.   Adjustment reaction with anxiety and depression-Recommend continuing Sertaline 100 mg daily. Most antidepressants/anxiolytics will impact cardiac function or gait stability. SW referral for counseling, discussed options for pt to regain control including options to d/c dialysis.          ESRD with Intra-dialytic hypotension-Recommend discussion with Nephrologist taking Midodrine 2.5 mg 30 minutes before getting ready to go to dialysis and 2.5 mg en route to dialysis.  Encouraged pt to discuss concerns/anxieties of ESRD and long term prognosis and when to stop if he wants.   Debility and fall risk-Encourage scheduling activities and rest  periods for energy conservation.  Discussed above use of Midodrine to improve BP for pt mobility to car to dialysis without fall.   Follow up Palliative Care Visit: Palliative care will continue to follow for complex medical decision making, advance care planning, and clarification of goals. Return 4 weeks or prn.    This visit was coded based on medical decision making (MDM).  PPS: 50%  HOSPICE ELIGIBILITY/DIAGNOSIS: TBD  Chief Complaint: Maribel received a referral to follow up with patient for chronic disease management, advance directive and defining/refining goals of care.  Pt c/o lack of control and feeling that his renal disease is a death sentence  HISTORY OF PRESENT ILLNESS:  David [name] is a 81 y.o. year old male who lives with his wife [name] and has adult children nearby.  He has ESRD, heart failure with preserved EF, hx of stroke and hypotension.  He states feeling significantly drained and wiped out after dialysis.  At times he is struggling with getting into the car safely without his legs "giving out".  He says he trouble at times expressing himself with what he wants to say with concerns he may have dementia. He has some noted myoclonic jerks in bilateral hands and is concerned about parkinsons versus is this a side effect of dialysis and how much worse would it get. He has numbness in his right hand that he thinks is a result of creation of the fistula with damage to a nerve and is no longer able to button his shirts but otherwise dresses/bathes himself. He feels  that his diagnosis of ESRD means he is eminently dying and he has thoughts of taking his own life since he has so little control and is feeling bad.  He is concerned about his wife and family which is what is most meaningful to him. His wife many times tries to express what he has said to her and was encouraged to allow him time to gather his thoughts to let him express what he is feeling  as a method of catharsis. She is very concerned for and supportive of patients.  Pt does not have any particularly strong spiritual beliefs.  Advised pt that he does have the control of being able to stop dialysis if and when he wants, that it would be difficult to estimate his prognostic time frame since there are many variable and some people live for years doing dialysis but it does predispose him to infection and makes him vulnerable from that standpoint and stopping dialysis would hasten end of life.  Pt noted to be rubbing wife's feet and he said that they do things like this for each other every day.  Encouraged pt to value these moments and journal them to review when his days are harder.  Advised that renal failure without dialysis usually means that pt waste builds up, they become uremic, pass into a coma and pass away.  Encouraged pt to consider that what he is feeling is a grief reaction with depression and anxiety which is normal when there is a loss of "normal" function as with renal failure and when facing a terminal diagnosis.  Encouraged pt that rather than attempting to take his own life, he consider discussing with nephrologist a stop date for dialysis if he truly does not want to continue that would allow for 1 final treatment, that he set up a time with his family for them to get together and have a meaningful family time with time to share things they want to share with each other and to allow time to enroll him in [facility].  Also advised that he is going through stages of grief and that it may also be helpful to meet with a counselor.  Pt was reassured that difficulty focusing mentally, sleep difficulties and lack of appetite which were all symptoms he mentioned were common symptoms of depression/anxiety.  Reassured that his memory issues were not indicative of dementia at this time. At end of contact, pt expressed that he was feeling better and was glad he had shared things with this  provider.  History obtained from review of EMR, discussion with primary team, and interview with wife and Mr. Meloni.  I reviewed available labs, medications, imaging, studies and related documents from the EMR.  Records reviewed and summarized above.   ROS General: NAD EYES: endorses vision changes but has diabetic retinopathy, unable to obtain injections due to taking 4 hours to do and unable to tolerate with dialysis ENMT: denies dysphagia Cardiovascular: denies chest pain, denies DOE Pulmonary: denies cough, denies increased SOB Abdomen: endorses poor appetite, denies constipation, endorses continence of bowel GU: endorses anuria MSK:  endorses increased weakness and falls with sudden "legs giving out" Skin: denies rashes or wounds Neurological: denies pain, endorses some insomnia and right hand numbness Psych: Endorses depressed mood and suicidal ideation Heme/lymph/immuno: denies bruises, abnormal bleeding  Physical Exam: Current and past weights: 251 lbs (06/09/21), 255 lbs 9.6 ounces (11/12/20) Constitutional: NAD General: Obese  EYES: anicteric sclera, lids intact, no discharge  ENMT: intact hearing,  oral mucous membranes moist, dentition intact CV: S1S2, IRIR with LUSB murmur, no LE edema Pulmonary: CTAB, no increased work of breathing, no cough, room air Abdomen: normo-active BS + 4 quadrants, soft and non tender, no ascites GU: deferred MSK: no sarcopenia, moves all extremities, self propels in wheelchair Skin: warm and dry, no rashes or wounds on visible skin Neuro:  no generalized weakness, occasional difficulty with focus to complete a thought Psych: depressed affect, tearful, A and O x 3 Hem/lymph/immuno: no widespread bruising  CURRENT PROBLEM LIST:  Patient Active Problem List   Diagnosis Date Noted   Insomnia 05/31/2021   Neuropathic pain 05/31/2021   Intra-dialytic hypotension 05/31/2021   Hypertrophy of prostate without urinary obstruction and other lower  urinary tract symptoms (LUTS) 05/19/2021   (HFpEF) heart failure with preserved ejection fraction ([place]) 02/25/2021   Other specified coagulation defects ([place]) 01/19/2021   Secondary hyperparathyroidism of renal origin ([place]) 01/19/2021   Encounter for screening for respiratory tuberculosis 01/15/2021   Allergy, unspecified, initial encounter 16/02/9603   Complication of vascular dialysis catheter 12/24/2020   Iron deficiency anemia, unspecified 12/18/2020   Anemia in chronic kidney disease 12/08/2020   Gout 05/18/2020   Leukocytosis 04/20/2020   History of CVA (cerebrovascular accident) 04/20/2020   Debility 03/20/2020   Osteoarthritis 03/13/2020   Diarrhea 02/18/2019   GERD (gastroesophageal reflux disease) 01/24/2019   Memory change 11/19/2018   Senile purpura ([place]) 11/27/2017   ESRD on dialysis ([place]) 09/26/2017   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 ([place]) 09/04/2017   Nonobstructive CAD s/p PCI 2012    Cardiac pacemaker in situ    PVD (peripheral vascular disease) ([place])    Anxiety 03/22/2017   Acute cholecystitis s/p lap cholecystectomy 03/05/2017 03/04/2017   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[place]) 03/04/2017   T2DM (type 2 diabetes mellitus) ([place]) 03/04/2017   Fatigue 01/27/2017   Low vitamin B12 level 01/27/2017   OSA on CPAP 10/26/2014   Seasonal and perennial allergic rhinitis 10/25/2013   Hypertension associated with diabetes ([place]) 10/25/2013   Hyperlipidemia associated with type 2 diabetes mellitus ([place]) 10/25/2013   PAST MEDICAL HISTORY:  Active Ambulatory Problems    Diagnosis Date Noted   Seasonal and perennial allergic rhinitis 10/25/2013   Hypertension associated with diabetes ([place]) 10/25/2013   Hyperlipidemia associated with type 2 diabetes mellitus ([place]) 10/25/2013   OSA on CPAP 10/26/2014   Fatigue 01/27/2017   Low vitamin B12 level 01/27/2017   Acute cholecystitis s/p lap cholecystectomy 03/05/2017 03/04/2017    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[place]) 03/04/2017   T2DM (type 2 diabetes mellitus) ([place]) 03/04/2017   Anxiety 03/22/2017   Nonobstructive CAD s/p PCI 2012    Cardiac pacemaker in situ    PVD (peripheral vascular disease) ([place])    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 ([place]) 09/04/2017   ESRD on dialysis ([place]) 09/26/2017   Senile purpura ([place]) 11/27/2017   Memory change 11/19/2018   GERD (gastroesophageal reflux disease) 01/24/2019   Diarrhea 02/18/2019   Osteoarthritis 03/13/2020   Debility 03/20/2020   Leukocytosis 04/20/2020   History of CVA (cerebrovascular accident) 04/20/2020   Gout 05/18/2020   (HFpEF) heart failure with preserved ejection fraction ([place]) 02/25/2021   Allergy, unspecified, initial encounter 12/24/2020   Anemia in chronic kidney disease 54/01/8118   Complication of vascular dialysis catheter 12/24/2020   Encounter for screening for respiratory tuberculosis 01/15/2021   Hypertrophy of prostate without urinary obstruction and other lower urinary tract symptoms (LUTS) 05/19/2021  Iron deficiency anemia, unspecified 12/18/2020   Other specified coagulation defects ([place]) 01/19/2021   Secondary hyperparathyroidism of renal origin ([place]) 01/19/2021   Insomnia 05/31/2021   Neuropathic pain 05/31/2021   Intra-dialytic hypotension 05/31/2021   Resolved Ambulatory Problems    Diagnosis Date Noted   DM (diabetes mellitus), type 2 with complications ([place]) 15/09/6977   Morbid obesity ([place]) 10/25/2013   CKD stage 3 due to type 2 diabetes mellitus ([place]) 10/25/2013   Cardiovascular disease 10/25/2013   Diabetes mellitus with neurological manifestations, uncontrolled ([place]) 02/27/2014   Mixed hyperlipidemia 02/27/2014   Essential hypertension, malignant 06/05/2014   Open toe wound 07/18/2014   Episodic lightheadedness 08/04/2014   Back pain 10/16/2014   Left leg numbness 10/16/2014   Fatigue 10/16/2014   Impacted cerumen of both ears  10/16/2014   Cellulitis of great toe of right foot 10/26/2014   Ankle fracture, left 10/29/2014   Closed left fibular fracture 10/29/2014   Cellulitis of right foot    Renal failure (ARF), acute on chronic (HCC)    Preoperative clearance 03/03/2015   Osteomyelitis of foot, right, acute ([place]) 05/04/2015   Sinus node arrhythmia 06/03/2015   Pacemaker 06/03/2015   Rib fracture 07/24/2015   Syncope 07/24/2015   Otitis, externa, infective 03/11/2016   Chronic ulcer of right foot ([place]) 08/09/2016   Lower extremity edema 04/21/2017   Acute ischemic stroke ([place]) 08/31/2017   Agitation    Restless    Diastolic dysfunction    Stage 3 chronic kidney disease (HCC)    Atrial fibrillation with rapid ventricular response (HCC)    Acute blood loss anemia    Tachypnea    Ephelides 09/19/2016   Hemangioma 48/05/6551   Neoplasm, uncertain whether benign or malignant 09/19/2016   Seborrheic keratosis 09/19/2016   Encephalopathy acute 01/05/2019   Acute encephalopathy 01/07/2019   Hypertensive encephalopathy 01/07/2019   Hypoglycemia 74/82/7078   Acute metabolic encephalopathy 67/54/4920   AKI (acute kidney injury) ([place]) 01/10/2019   SOB (shortness of breath) 04/20/2020   Acute respiratory failure with hypoxia (HCC) 04/20/2020   Fluid overload 04/20/2020   Hypertensive urgency 04/20/2020   Anaphylactic shock, unspecified, initial encounter 12/24/2020   Past Medical History:  Diagnosis Date   Anemia, iron deficiency    Arthritis    BPH (benign prostatic hypertrophy)    CAD (coronary artery disease)    Cancer (HCC)    CHF (congestive heart failure) (HCC)    CKD (chronic kidney disease) stage 4, GFR 15-29 ml/min ([place]) 09/26/2017   CKD (chronic kidney disease), stage III (HCC) secondary to DM and HTN   Depression    Dyspnea    Dysrhythmia    Hearing loss    History of cellulitis    History of skin cancer    HOCM (hypertrophic obstructive cardiomyopathy) ([place])    HOH (hard of hearing)     Hypertension    IDDM (insulin dependent diabetes mellitus) - on insulin pump 03/04/2017   Insulin dependent type 2 diabetes mellitus ([place]) 1991   Insulin pump in place    Memory loss    Peripheral neuropathy    Peripheral vascular disease ([place])    Sinus node dysfunction ([place])    Sleep apnea    Walker as ambulation aid    SOCIAL HX:  Social History   Tobacco Use   Smoking status: Former    Packs/day: 2.00    Years: 30.00    Pack years: 60.00    Types: Cigarettes    Quit date:  03/03/1984    Years since quitting: 37.2   Smokeless tobacco: Never   Tobacco comments:    Former smoker 03/15/2021  Substance Use Topics   Alcohol use: Not Currently    Comment: 1  beer a month   FAMILY HX:  Family History  Problem Relation Age of Onset   Cancer Mother        breast   Heart attack Father    Stroke Father        Preferred Pharmacy: ALLERGIES:  Allergies  Allergen Reactions   Ativan [Lorazepam] Anxiety and Other (See Comments)    Pt gets more agitated   Adhesive [Tape] Other (See Comments)    blisters   Niacin Rash     PERTINENT MEDICATIONS:  Outpatient Encounter Medications as of 06/11/2021  Medication Sig   acetaminophen (TYLENOL) 500 MG tablet Take 1,000 mg by mouth 2 (two) times daily as needed (pain.).   apixaban (ELIQUIS) 2.5 MG TABS tablet Take 1 tablet (2.5 mg total) by mouth 2 (two) times daily.   atorvastatin (LIPITOR) 10 MG tablet Take 1 tablet (10 mg total) by mouth every evening.   blood glucose meter kit and supplies KIT Dispense based on patient and insurance preference. Use up to four times daily as directed. (FOR ICD-9 250.00, 250.01).   Blood Glucose Monitoring Suppl (ACCU-CHEK GUIDE) w/Device KIT Use to check blood sugar daily   calcitRIOL (ROCALTROL) 0.25 MCG capsule Take 1 capsule (0.25 mcg total) by mouth daily.   carvedilol (COREG) 6.25 MG tablet Take 3.125 mg by mouth 2 (two) times daily.   cetirizine (ZYRTEC) 10 MG tablet Take by mouth.    Darbepoetin Alfa (ARANESP, ALBUMIN FREE,) 60 MCG/ML SOLN Inject 60 mcg as directed every 30 (thirty) days.   Epoetin Alfa (EPOGEN IJ) Inject as directed See admin instructions. Every 4 weeks   folic acid-vitamin b complex-vitamin c-selenium-zinc (DIALYVITE) 3 MG TABS tablet Take 1 tablet by mouth every evening.   gabapentin (NEURONTIN) 300 MG capsule Take 1 capsule (300 mg total) by mouth 3 (three) times a week. After dialysis   glucose blood test strip Use Contour test strips as instructed to check blood sugar four times daily.   insulin aspart (NOVOLOG FLEXPEN) 100 UNIT/ML FlexPen Inject 10-12 Units into the skin See admin instructions. Inject 10 units subcutaneously before breakfast and 12 units before supper (Patient taking differently: Inject 8 Units into the skin in the morning and at bedtime.)   Insulin Syringe-Needle U-100 (INSULIN SYRINGE 1CC/31GX5/16") 31G X 5/16" 1 ML MISC 1 each by Does not apply route 3 (three) times daily. Use insulin syringe to inject insulin three times daily.   iron sucrose in sodium chloride 0.9 % 100 mL Iron Sucrose (Venofer)   midodrine (PROAMATINE) 2.5 MG tablet Take 0.5 tablets (1.25 mg total) by mouth 3 (three) times a week. Before dialysis   Omega-3 Fatty Acids (FISH OIL) 1000 MG CAPS Take 1 capsule (1,000 mg total) by mouth every morning.   pantoprazole (PROTONIX) 40 MG tablet Take 1 tablet (40 mg total) by mouth daily.   sertraline (ZOLOFT) 100 MG tablet Take 1 tablet (100 mg total) by mouth daily.   sevelamer carbonate (RENVELA) 800 MG tablet Take 800 mg by mouth 3 (three) times daily with meals.   TRESIBA FLEXTOUCH 200 UNIT/ML FlexTouch Pen INJECT 44 UNITS SUBCUTANEOUSLY (UNDER THE SKIN) ONCE A DAY (Patient taking differently: No sig reported)   VICTOZA 18 MG/3ML SOPN INJECT 1.8MG UNDER THE SKIN DAILY BEFORE SUPPER  No facility-administered encounter medications on file as of 06/11/2021.      ---------------------------------------------------------------------------------------------------------------------------------------------------------------------------------------------------------------- Advance Care Planning/Goals of Care: Goals include to maximize quality of life, self control and symptom management. Patient/health care surrogate gave their permission to discuss.Our advance care planning conversation included a discussion about:    The value and importance of advance care planning  Experiences with loved ones who have been seriously ill or have died--Wife states that 4 years prior that pt experienced a cardiac arrest and was eventually resuscitated.  She states on discussion after she asked him what he felt.  She states that pt told her he was at peace and with family who had passed away and that he was given the choice to come back or not and that he chose to come back giving them 4 more years of wonderful memories together.  She was struggling with the idea of now making him DNR.  Exploration of personal, cultural or spiritual beliefs that might influence medical decisions- No strong spiritual beliefs. Pt states having open conversations with his son about his wishes and that son was supportive. Exploration of goals of care in the event of a sudden injury or illness  Identification  of a healthcare agent-son is [facility] POA Review of advance directive document-Discussed DNR and MOST-pt to discuss with family and decide if and how he wants to proceed  CODE STATUS: Full Code currently  I spent 45 minutes providing this consultation providing Palliative Care counseling on goals of care. More than 50% of the time in this consultation was spent in counseling and care coordination. 3-3;45 pm   Thank you for the opportunity to participate in the care of Mr. Kawecki.  The palliative care team will continue to follow. Please call our office at [phone] if we can be of  additional assistance.   Marijo Conception, FNP-C  COVID-19 PATIENT SCREENING TOOL Asked and negative response unless otherwise noted:  Have you had symptoms of covid, tested positive or been in contact with someone with symptoms/positive test in the past 5-10 days?

## 2021-06-12 DIAGNOSIS — N186 End stage renal disease: Secondary | ICD-10-CM | POA: Diagnosis not present

## 2021-06-12 DIAGNOSIS — E138 Other specified diabetes mellitus with unspecified complications: Secondary | ICD-10-CM | POA: Diagnosis not present

## 2021-06-12 DIAGNOSIS — N2581 Secondary hyperparathyroidism of renal origin: Secondary | ICD-10-CM | POA: Diagnosis not present

## 2021-06-12 DIAGNOSIS — Z992 Dependence on renal dialysis: Secondary | ICD-10-CM | POA: Diagnosis not present

## 2021-06-12 DIAGNOSIS — D631 Anemia in chronic kidney disease: Secondary | ICD-10-CM | POA: Diagnosis not present

## 2021-06-12 DIAGNOSIS — D509 Iron deficiency anemia, unspecified: Secondary | ICD-10-CM | POA: Diagnosis not present

## 2021-06-14 DIAGNOSIS — Z515 Encounter for palliative care: Secondary | ICD-10-CM | POA: Insufficient documentation

## 2021-06-14 DIAGNOSIS — Z9181 History of falling: Secondary | ICD-10-CM | POA: Insufficient documentation

## 2021-06-14 DIAGNOSIS — F4323 Adjustment disorder with mixed anxiety and depressed mood: Secondary | ICD-10-CM | POA: Insufficient documentation

## 2021-06-15 ENCOUNTER — Telehealth: Payer: Self-pay | Admitting: Pharmacist

## 2021-06-15 DIAGNOSIS — N186 End stage renal disease: Secondary | ICD-10-CM | POA: Diagnosis not present

## 2021-06-15 DIAGNOSIS — Z992 Dependence on renal dialysis: Secondary | ICD-10-CM | POA: Diagnosis not present

## 2021-06-15 DIAGNOSIS — D631 Anemia in chronic kidney disease: Secondary | ICD-10-CM | POA: Diagnosis not present

## 2021-06-15 DIAGNOSIS — D509 Iron deficiency anemia, unspecified: Secondary | ICD-10-CM | POA: Diagnosis not present

## 2021-06-15 DIAGNOSIS — N2581 Secondary hyperparathyroidism of renal origin: Secondary | ICD-10-CM | POA: Diagnosis not present

## 2021-06-15 DIAGNOSIS — E138 Other specified diabetes mellitus with unspecified complications: Secondary | ICD-10-CM | POA: Diagnosis not present

## 2021-06-15 NOTE — Chronic Care Management (AMB) (Signed)
Chronic Care Management Pharmacy Assistant   Name: David [name]  MRN: [id number] DOB: 22-Feb-1941   Reason for Encounter: Diabetes Adherence Call    Recent office visits:  06/08/2021 VV (PCP) [name], MD; Felt like he had increased anxiety with midodrine.  We will cut dose to 1.25 mg 3 times weekly before dialysis  05/31/2021 OV (PCP) [name], MD; We will be starting gabapentin 348m three times weekly after dialysis for neuropathic pain,  May consider trial of low-dose trazodone if not improving with above.  He is having some dialysis induced hypotension.  We will start low-dose midodrine, His nephrologist recommended midodrine.  This is reasonable.  We will start 2.5 mg 3 times weekly before dialysis.  Recent consult visits:  05/26/2021 VV (Endo) KElayne Snare MD;  He will be given a new glucose meter He will benefit from freestyle libre or Dexcom but he refuses to consider this Discussed need to check blood sugars consistently after meals by rotation For now we will reduce Tresiba down to 32 instead of 36  05/19/2021 OV (Podiatry) GMarzetta Board DPM; no medication changes indicated  04/05/2021 Atrial Fib OV (Cardiology) CSherran Needs NP; I discussed cardioversion but pt does not want to pursue Continue carvedilol 12.5 mg bid   03/30/2021 OV (Vascular Surgery) SBarbie Banner PA-C; no medication changes indicated.  03/15/2021 Atrial Fib OV (Cardiology) CSherran Needs NP; Discussed with JFrann Rider NP and ok to stop plavix(started at time of stroke 4 years ago)  and will start Eliquis 2.5 mg bid   Hospital visits:  06/05/2021 ED Visit for Syncope, unspecified type -No medication changes indicated.  Medications: Outpatient Encounter Medications as of 06/15/2021  Medication Sig Note   acetaminophen (TYLENOL) 500 MG tablet Take 1,000 mg by mouth 2 (two) times daily as needed (pain.).    apixaban (ELIQUIS) 2.5 MG TABS tablet Take 1 tablet (2.5  mg total) by mouth 2 (two) times daily.    atorvastatin (LIPITOR) 10 MG tablet Take 1 tablet (10 mg total) by mouth every evening.    blood glucose meter kit and supplies KIT Dispense based on patient and insurance preference. Use up to four times daily as directed. (FOR ICD-9 250.00, 250.01).    Blood Glucose Monitoring Suppl (ACCU-CHEK GUIDE) w/Device KIT Use to check blood sugar daily    calcitRIOL (ROCALTROL) 0.25 MCG capsule Take 1 capsule (0.25 mcg total) by mouth daily.    carvedilol (COREG) 6.25 MG tablet Take 3.125 mg by mouth 2 (two) times daily. 06/08/2021: 3.25 mg BID changed by dialysis   cetirizine (ZYRTEC) 10 MG tablet Take by mouth.    Darbepoetin Alfa (ARANESP, ALBUMIN FREE,) 60 MCG/ML SOLN Inject 60 mcg as directed every 30 (thirty) days.    Epoetin Alfa (EPOGEN IJ) Inject as directed See admin instructions. Every 4 weeks    folic acid-vitamin b complex-vitamin c-selenium-zinc (DIALYVITE) 3 MG TABS tablet Take 1 tablet by mouth every evening.    gabapentin (NEURONTIN) 300 MG capsule Take 1 capsule (300 mg total) by mouth 3 (three) times a week. After dialysis    glucose blood test strip Use Contour test strips as instructed to check blood sugar four times daily.    insulin aspart (NOVOLOG FLEXPEN) 100 UNIT/ML FlexPen Inject 10-12 Units into the skin See admin instructions. Inject 10 units subcutaneously before breakfast and 12 units before supper (Patient taking differently: Inject 8 Units into the skin in the morning and at bedtime.)  Insulin Syringe-Needle U-100 (INSULIN SYRINGE 1CC/31GX5/16") 31G X 5/16" 1 ML MISC 1 each by Does not apply route 3 (three) times daily. Use insulin syringe to inject insulin three times daily.    iron sucrose in sodium chloride 0.9 % 100 mL Iron Sucrose (Venofer)    midodrine (PROAMATINE) 2.5 MG tablet Take 0.5 tablets (1.25 mg total) by mouth 3 (three) times a week. Before dialysis    Omega-3 Fatty Acids (FISH OIL) 1000 MG CAPS Take 1 capsule  (1,000 mg total) by mouth every morning.    pantoprazole (PROTONIX) 40 MG tablet Take 1 tablet (40 mg total) by mouth daily.    sertraline (ZOLOFT) 100 MG tablet Take 1 tablet (100 mg total) by mouth daily.    sevelamer carbonate (RENVELA) 800 MG tablet Take 800 mg by mouth 3 (three) times daily with meals.    TRESIBA FLEXTOUCH 200 UNIT/ML FlexTouch Pen INJECT 44 UNITS SUBCUTANEOUSLY (UNDER THE SKIN) ONCE A DAY (Patient taking differently: No sig reported)    VICTOZA 18 MG/3ML SOPN INJECT 1.8MG UNDER THE SKIN DAILY BEFORE SUPPER    No facility-administered encounter medications on file as of 06/15/2021.   Recent Relevant Labs: Lab Results  Component Value Date/Time   HGBA1C 6.7 (H) 05/21/2021 02:42 PM   HGBA1C 6.7 (H) 11/10/2020 01:48 PM   HGBA1C 11.5 (H) 10/26/2014 04:36 AM   MICROALBUR 7.3 (H) 03/05/2019 03:26 PM   MICROALBUR 11.9 (H) 10/24/2017 10:50 AM    Kidney Function Lab Results  Component Value Date/Time   CREATININE 5.76 (H) 06/05/2021 03:20 PM   CREATININE 6.80 (H) 02/24/2021 06:56 AM   CREATININE 1.82 (H) 01/27/2017 11:56 AM   CREATININE 2.29 (H) 05/26/2015 03:30 PM   GFR 21.25 (L) 03/30/2020 01:52 PM   GFRNONAA 9 (L) 06/05/2021 03:20 PM   GFRAA 22 (L) 01/31/2020 02:00 PM    Current antihyperglycemic regimen:  Victoza  Tresiba Novolog Flexpen  What recent interventions/DTPs have been made to improve glycemic control:  No recent interventions or DTPs for glycemic control.    Have there been any recent hospitalizations or ED visits since last visit with CPP? Yes  Patient reports hypoglycemic symptoms. States the symptoms are due to low blood pressure during dialysis three days a week. Patient wife states when he has these symptoms his blood sugars are always fine.  Patient denies hyperglycemic symptoms.  How often are you checking your blood sugar? 3-4 times daily  What are your blood sugars ranging?  Fasting: 90 Before meals: 140 After meals:  Bedtime:  120-130  During the week, how often does your blood glucose drop below 70? Never  Are you checking your feet daily/regularly? Patient sees a podiatrist regularly.  Adherence Review: Is the patient currently on a STATIN medication? Yes Is the patient currently on ACE/ARB medication? No Does the patient have >5 day gap between last estimated fill dates? No   Care Gaps: Medicare Annual Wellness: Completed 07/23/2020 Ophthalmology Exam: Next due on 07/13/2021 Foot Exam: Next due on 05/19/2022 Hemoglobin A1C: 6.7% on 05/21/2021 Colonoscopy: Aged out  Future Appointments  Date Time Provider [place]  07/05/2021  1:30 PM [facility] CCM PHARMACIST [facility]  07/29/2021  2:30 PM [facility] HEALTH COACH [facility]  08/18/2021  1:30 PM Marzetta Board, DPM [facility]  09/01/2021  7:00 AM [facility] DEVICE REMOTES [facility]  12/01/2021  7:00 AM [facility] DEVICE REMOTES [facility]  03/02/2022  7:00 AM [facility] DEVICE REMOTES [facility]  06/01/2022  7:00 AM [facility]  DEVICE REMOTES [facility]  08/31/2022  7:00 AM [facility] DEVICE REMOTES [facility]  11/30/2022  7:00 AM [facility] DEVICE REMOTES [facility]    Star Rating Drugs: Novolog Flexpen last filled 06/02/2021 68 DS Tresiba Flex last filled 05/29/2021 30 DS Victoza last filled 06/05/2021 30 DS Atorvastatin 10 mg last filled 04/26/2021 90 DS  April D Calhoun, [place] Pharmacist Assistant [phone]

## 2021-06-15 NOTE — Progress Notes (Signed)
Remote pacemaker transmission.   

## 2021-06-17 ENCOUNTER — Other Ambulatory Visit: Payer: Self-pay

## 2021-06-17 ENCOUNTER — Other Ambulatory Visit: Payer: Medicare Other

## 2021-06-17 ENCOUNTER — Other Ambulatory Visit: Payer: Medicare Other | Admitting: Family Medicine

## 2021-06-17 ENCOUNTER — Encounter: Payer: Self-pay | Admitting: Family Medicine

## 2021-06-17 VITALS — Resp 16

## 2021-06-17 DIAGNOSIS — E1122 Type 2 diabetes mellitus with diabetic chronic kidney disease: Secondary | ICD-10-CM

## 2021-06-17 DIAGNOSIS — Z8673 Personal history of transient ischemic attack (TIA), and cerebral infarction without residual deficits: Secondary | ICD-10-CM

## 2021-06-17 DIAGNOSIS — Z515 Encounter for palliative care: Secondary | ICD-10-CM

## 2021-06-17 DIAGNOSIS — N186 End stage renal disease: Secondary | ICD-10-CM

## 2021-06-17 DIAGNOSIS — Z794 Long term (current) use of insulin: Secondary | ICD-10-CM

## 2021-06-17 NOTE — Progress Notes (Signed)
[facility] Palliative Care Consult Note Telephone: [phone]  Fax: [phone]    Date of encounter: 06/17/21 6:10 PM PATIENT NAME: David [name] [address] [place] [address]   [phone] (home)  DOB: 12/19/1940 MRN: [id number] PRIMARY CARE PROVIDER:    [name], MD,  [address] [place] [address] [phone]  REFERRING PROVIDER:   Vivi [name], [place],  [place] [address] [phone]  RESPONSIBLE PARTY:    Contact Information     Name Relation Home Work [place] Spouse [phone]  [phone]   David [name] [phone]  [phone]   David [name] Daughter   [phone]        I met face to face with patient, wife, son and daughter in law in his  home. Palliative Care was asked to follow this patient by consultation request of  David Barrack, MD to address advance care planning and complex medical decision making. This is a follow up visit.                                   ASSESSMENT, SYMPTOM MANAGEMENT AND PLAN / RECOMMENDATIONS:  Palliative Care Encounter-Provide support, answer pt and family questions, completed MOST form.  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  3.  ESRD on dialysis-has had incomplete treatment on Tuesday, declined  treatment today.  Likely ready for Hospice level of care if does not take treatment Saturday. Current psychomotor retardation and somnolence likely related to missed treatment, build up of nitrogenous waste.  4.  Hx of CVA-unclear if pt has had repeat neurological event but most likely related to missed treatment     Advance Care Planning/Goals of Care: Goals include to maximize quality of life and symptom management. Patient/health care surrogate gave their permission to  discuss. Our advance care planning conversation included a discussion about:    The value and importance of advance care planning-pt had completed his wishes on the MOST form Exploration of personal, cultural or spiritual beliefs that might influence medical decisions.  Pt does not particularly hold any strong religious beliefs but states his quality of life is poor. Exploration of goals of care in the event of a sudden injury or illness-wishes to be kept comfortable and at home, no hospitalization.  Answered questions for wife and son with regard to short life expectancy if not receiving dialysis and that current somnolence may be related to incomplete treatment Tuesday with no treatment today.  Advised on process if pt found without a heartbeat and to notify Palliative Care immediately if pt decides to stop dialysis. Identification of a healthcare agent-wife, David [name] Review and creation of an advance directive document -MOST form. Decision not to resuscitate or to de-escalate disease focused treatments due to poor prognosis.  CODE STATUS: MOST as of 06/17/2021: DNR/DNI Comfort measures No antibiotics, IV fluids or feeding tubes and no hospitalizations    Follow up Palliative Care Visit: Palliative care will continue to follow for complex medical decision making, advance care planning, and clarification of goals. Return 1 weeks or prn.   This visit was coded based on medical decision making (MDM).  PPS: 20%  HOSPICE ELIGIBILITY/DIAGNOSIS: TBD  Chief Complaint:  Patient's wife contacted [place] office indicating that family believes  that pt has had a stroke and may want to pursue hospice.  HISTORY OF PRESENT ILLNESS:  David [name] is a 81 y.o. year old male with ESRD on dialysis, hypertrophic obstructive cardiomyopathy and CHF with preserved EF, IDDM and hx of stroke. He has had difficulty with profound hypotension during dialysis and had been started on Midodrine to help.  On  Tuesday wife states pt went for dialysis but prematurely stopped treatment and left after 3 hours.  She states he was very angry and upset that evening about his health and the need for dialysis particularly with how bad he was feeling after treatment.  She stated that pt began "not to be himself, saying he thinks he had a stroke yesterday" and the family agreed that there was a significant change although they have trouble articulating the exact change.  He did eat and had no difficulties with eating, speech or one-sided weakness or numbness per the family.  He was increasingly more somnolent throughout the day even when sitting in the chair and today pt refused to go to dialysis.  Pt was in bed on my arrival, denied that he had pain, nausea or SOB.  His responses were slow, speech was mildly slurred and at times questions had to be repeated several times. Wife indicated he had eaten some but not a lot so she had decreased his Tresiba from 32 units to 30 units and had given him 8 units Novolog with blood sugar pre-meal of 189 because she had not given him insulin earlier in the day.  Encouraged her to monitor his blood sugar which may drop overnight particularly with low intake.  Initially pt did not make the connection to this provider as the Palliative NP but after several minutes of conversation he was able to state the relationship. Reviewed his wishes with regard to MOST form.  Pt was obviously very fatigued and would doze off during conversation.  He was able to follow commands but as stated response was slower and may have to have request repeated once or twice to be able to do what was asked.  I asked the patient if he was planning to go back to dialysis or if he was wanting to stop dialysis and he stated he plans to go back for treatment on Saturday.  Wife and family agree that pt is approaching his limit with dialysis.  Pt's daughter that had come by Friday had URI sx and found out Saturday that she had  Covid, was started on treatment and has quarantined away from pt and wife since Friday.  She is no longer febrile and feeling better on day 7 of illness.  Advised wife that if she is not febrile or having shortness of breath or significant cough that she is likely no longer transmissible and can be around the family.  Advised for her to wear a mask.  They have a daughter who is trying to come in earlier than planned arrival Tuesday from out of town.   History obtained from review of EMR, interview with family, and Mr. Mcginniss.  I reviewed available labs, medications, imaging, studies and related documents from the EMR.  Records reviewed and summarized above.   ROS General: NAD, fatigued and drowsy EYES:  Denies visual changes or loss of visual field ENMT: denies dysphagia coughing or choking after eating or drinking Cardiovascular: denies chest pain, denies DOE Pulmonary: denies cough, denies increased SOB Abdomen: endorses poor appetite, no bowel incontinence GU: denies urinary  incontinence but is oliguric MSK:  reports generalized increased weakness,  no falls reported Neurological: denies pain, family reports increased drowsiness and somnolence Psych: Endorses depressed mood Heme/lymph/immuno: denies bruises, abnormal bleeding  Physical Exam: Constitutional: NAD, appears fatigued General: frail appearing, obese  EYES: anicteric sclera, lids intact, no discharge  ENMT: intact hearing CV:  IRIR, no LE edema, difficult to auscultate BP/ palpate pulse Pulmonary: CTAB, no increased work of breathing, no cough, room air Abdomen: hypo-active BS + 4 quadrants, soft and non tender, no ascites GU: deferred MSK: no sarcopenia, moves all extremities with question of slight weakness in RUE, bed or chairbound Skin: warm and dry, no rashes or wounds on visible skin Neuro:  noted generalized weakness, PERRL Psych: non-anxious affect, psychomotor retardation, diff remaining focused, drowsy but  arousable Hem/lymph/immuno: no widespread bruising   Thank you for the opportunity to participate in the care of Mr. Swickard.  The palliative care team will continue to follow. Please call our office at [phone] if we can be of additional assistance.   Marijo Conception, FNP   COVID-19 PATIENT SCREENING TOOL Asked and negative response unless otherwise noted:   Have you had symptoms of covid, tested positive or been in contact with someone with symptoms/positive test in the past 5-10 days?  Yes, daughter was over to see pt on last Friday and diagnosed with Covid Sat, treated and is now doing better.

## 2021-06-18 ENCOUNTER — Other Ambulatory Visit: Payer: Self-pay

## 2021-06-18 ENCOUNTER — Telehealth: Payer: Self-pay | Admitting: Family Medicine

## 2021-06-18 NOTE — Telephone Encounter (Signed)
David [name] called from [employer] wanting to know if Dr. [name] would be willing to support the patient if pt stops going to Dialysis. Pt voluntary left Dialysis earlier this week on Tuesday, and did not go on yesterday (Thursday). Please call David [name] at [phone] asap. Thank You!!!

## 2021-06-18 NOTE — Telephone Encounter (Signed)
TCT patient's wife.  She states that he has had difficulty today with finding words and expressing his thoughts which has caused him to be frustrated.  He has been eating some sleeping a lot last night and today.  Wife says she has not asked or addressed his going to dialysis with pt tomorrow.  Advised her that if he does not go to call the main office and let them know that I had recommended urgent hospice transition with supervising physician agreement.  Advised that supervising physician indicated likely 2 weeks or less if stops dialysis.  Wife verbalized understanding and echoed this is what she was told by Nephrologists when pt started dialysis.  Sent information to supervising physician presenting pt deterioration and asking if pt stops dialysis can he be transitioned to Hospice and he states pt is Hospice eligible if he stops dialysis.  TCT Dr Hetty Blend Parker's office and left message that if pt does not go to dialysis tomorrow would he be in agreement with supervising physician that pt is eligible for Hospice and is he in agreement to remain pt's primary attending for Hospice.  Provided phone number for call back.  Damaris Hippo FNP-C

## 2021-06-19 ENCOUNTER — Telehealth: Payer: Self-pay | Admitting: Nurse Practitioner

## 2021-06-19 ENCOUNTER — Telehealth: Payer: Self-pay | Admitting: Family Medicine

## 2021-06-19 DIAGNOSIS — D509 Iron deficiency anemia, unspecified: Secondary | ICD-10-CM | POA: Diagnosis not present

## 2021-06-19 DIAGNOSIS — G4700 Insomnia, unspecified: Secondary | ICD-10-CM | POA: Diagnosis not present

## 2021-06-19 DIAGNOSIS — E1122 Type 2 diabetes mellitus with diabetic chronic kidney disease: Secondary | ICD-10-CM | POA: Diagnosis not present

## 2021-06-19 DIAGNOSIS — G4733 Obstructive sleep apnea (adult) (pediatric): Secondary | ICD-10-CM | POA: Diagnosis not present

## 2021-06-19 DIAGNOSIS — E1142 Type 2 diabetes mellitus with diabetic polyneuropathy: Secondary | ICD-10-CM | POA: Diagnosis not present

## 2021-06-19 DIAGNOSIS — I69354 Hemiplegia and hemiparesis following cerebral infarction affecting left non-dominant side: Secondary | ICD-10-CM | POA: Diagnosis not present

## 2021-06-19 DIAGNOSIS — I495 Sick sinus syndrome: Secondary | ICD-10-CM | POA: Diagnosis not present

## 2021-06-19 DIAGNOSIS — F32A Depression, unspecified: Secondary | ICD-10-CM | POA: Diagnosis not present

## 2021-06-19 DIAGNOSIS — I2510 Atherosclerotic heart disease of native coronary artery without angina pectoris: Secondary | ICD-10-CM | POA: Diagnosis not present

## 2021-06-19 DIAGNOSIS — I69311 Memory deficit following cerebral infarction: Secondary | ICD-10-CM | POA: Diagnosis not present

## 2021-06-19 DIAGNOSIS — D6859 Other primary thrombophilia: Secondary | ICD-10-CM | POA: Diagnosis not present

## 2021-06-19 DIAGNOSIS — E538 Deficiency of other specified B group vitamins: Secondary | ICD-10-CM | POA: Diagnosis not present

## 2021-06-19 DIAGNOSIS — E1151 Type 2 diabetes mellitus with diabetic peripheral angiopathy without gangrene: Secondary | ICD-10-CM | POA: Diagnosis not present

## 2021-06-19 DIAGNOSIS — I872 Venous insufficiency (chronic) (peripheral): Secondary | ICD-10-CM | POA: Diagnosis not present

## 2021-06-19 DIAGNOSIS — I69392 Facial weakness following cerebral infarction: Secondary | ICD-10-CM | POA: Diagnosis not present

## 2021-06-19 DIAGNOSIS — I509 Heart failure, unspecified: Secondary | ICD-10-CM | POA: Diagnosis not present

## 2021-06-19 DIAGNOSIS — I953 Hypotension of hemodialysis: Secondary | ICD-10-CM | POA: Diagnosis not present

## 2021-06-19 DIAGNOSIS — E11319 Type 2 diabetes mellitus with unspecified diabetic retinopathy without macular edema: Secondary | ICD-10-CM | POA: Diagnosis not present

## 2021-06-19 DIAGNOSIS — R197 Diarrhea, unspecified: Secondary | ICD-10-CM | POA: Diagnosis not present

## 2021-06-19 DIAGNOSIS — F419 Anxiety disorder, unspecified: Secondary | ICD-10-CM | POA: Diagnosis not present

## 2021-06-19 DIAGNOSIS — N2581 Secondary hyperparathyroidism of renal origin: Secondary | ICD-10-CM | POA: Diagnosis not present

## 2021-06-19 DIAGNOSIS — M109 Gout, unspecified: Secondary | ICD-10-CM | POA: Diagnosis not present

## 2021-06-19 DIAGNOSIS — I421 Obstructive hypertrophic cardiomyopathy: Secondary | ICD-10-CM | POA: Diagnosis not present

## 2021-06-19 DIAGNOSIS — N186 End stage renal disease: Secondary | ICD-10-CM | POA: Diagnosis not present

## 2021-06-19 DIAGNOSIS — I132 Hypertensive heart and chronic kidney disease with heart failure and with stage 5 chronic kidney disease, or end stage renal disease: Secondary | ICD-10-CM | POA: Diagnosis not present

## 2021-06-19 NOTE — Progress Notes (Signed)
PATIENT NAME: RACE LATOUR DOB: 07-Sep-1940 MRN: [id number]  PRIMARY CARE PROVIDER: [name], MD  RESPONSIBLE PARTY:  Acct ID - Guarantor Home Phone Work Phone Relationship Acct Type  [id number] KADAN, [phone]  Self P/F     [place], [place], [place] [address]   COMMUNITY PALLIATIVE CARE RN NOTE    PLAN OF CARE and INTERVENTION:  ADVANCE CARE PLANNING/GOALS OF CARE: Comfort. Patient declined dialysis today.  CAREGIVER EDUCATION: Hospice services DISEASE STATUS: RN Palliative telephonic encounter completed with patient's wife, [name]. [name] shared that patient did not complete the full dialysis treatment on Tuesday and refused to go today. She notes patient is having tremors and is sleeping more today. Patient stated to wife, [name], that he had a stroke. He refuses to go to ED for evaluation. Inquired if patient is verbal and if he is taking anything by mouth. [name] stated that he remains verbal and was able to eat toast and drink coffee this morning. She reports he was able to swallow without coughing or other difficulties. [name] feels he is ready to transition to hospice care. Discussed hospice services and process for evaluation. Palliative care NP updated and planned to see patient. Directed this RN to not pursue order for Hospice eval at this time as patient may be unsure if he is completely stopping dialysis.  HISTORY OF PRESENT ILLNESS: This is an 81 year old male with ESRD. Palliative care has been asked to follow for additional support, care and complex decision making.   CODE STATUS: DNR PPS: 30% Physical Exam: deferred      Duration: 85 min       Dietrich Pates, BSN

## 2021-06-19 NOTE — Telephone Encounter (Signed)
Received call from Dr Lorelei Pont, updated case, agree to give order to proceed with Hospice services.   I called [name], Mr Billinger's wife asked about allergies to ativan. Discussed roxanol for pain or shortness of breath and will send in clonazepam for anxiety. Educated on both medications  Rx: Morphine 20mg /ml give 0.41ml (5mg ) q4hrs as needed for pain or shortness of breath  NO RF; #30 ml  Rx: Clonazepam 0.5mg  po q6hrs for anxiety; agitation; NO RF; #20  Both escribed to [facility]

## 2021-06-19 NOTE — Telephone Encounter (Signed)
Gave ok for hospice orders to David [name] on patient request. No longer wishes to undergo dialysis and does not wish to go to hospital

## 2021-06-19 NOTE — Telephone Encounter (Signed)
I received an email concerning Mr Whobrey wishing to stop HD and requesting hospice services. I called [name], Mr Clack wife, discussed Mr. Lazaro's wishes to stop HD, refuses to go to a facility or hospital. [name] in agreement to proceed with Hospice services. I have contacted Dr [name] to get Hospice order.

## 2021-06-20 DIAGNOSIS — E1142 Type 2 diabetes mellitus with diabetic polyneuropathy: Secondary | ICD-10-CM | POA: Diagnosis not present

## 2021-06-20 DIAGNOSIS — E11319 Type 2 diabetes mellitus with unspecified diabetic retinopathy without macular edema: Secondary | ICD-10-CM | POA: Diagnosis not present

## 2021-06-20 DIAGNOSIS — I132 Hypertensive heart and chronic kidney disease with heart failure and with stage 5 chronic kidney disease, or end stage renal disease: Secondary | ICD-10-CM | POA: Diagnosis not present

## 2021-06-20 DIAGNOSIS — I509 Heart failure, unspecified: Secondary | ICD-10-CM | POA: Diagnosis not present

## 2021-06-20 DIAGNOSIS — N186 End stage renal disease: Secondary | ICD-10-CM | POA: Diagnosis not present

## 2021-06-20 DIAGNOSIS — E1122 Type 2 diabetes mellitus with diabetic chronic kidney disease: Secondary | ICD-10-CM | POA: Diagnosis not present

## 2021-06-21 DIAGNOSIS — E1142 Type 2 diabetes mellitus with diabetic polyneuropathy: Secondary | ICD-10-CM | POA: Diagnosis not present

## 2021-06-21 DIAGNOSIS — E1122 Type 2 diabetes mellitus with diabetic chronic kidney disease: Secondary | ICD-10-CM | POA: Diagnosis not present

## 2021-06-21 DIAGNOSIS — I509 Heart failure, unspecified: Secondary | ICD-10-CM | POA: Diagnosis not present

## 2021-06-21 DIAGNOSIS — E11319 Type 2 diabetes mellitus with unspecified diabetic retinopathy without macular edema: Secondary | ICD-10-CM | POA: Diagnosis not present

## 2021-06-21 DIAGNOSIS — I132 Hypertensive heart and chronic kidney disease with heart failure and with stage 5 chronic kidney disease, or end stage renal disease: Secondary | ICD-10-CM | POA: Diagnosis not present

## 2021-06-21 DIAGNOSIS — N186 End stage renal disease: Secondary | ICD-10-CM | POA: Diagnosis not present

## 2021-06-21 NOTE — Telephone Encounter (Signed)
Pharmacist, community at [place] Client [facility] Night Provider Dimas Chyle- MD Contact Type Call Who Is Calling Physician / Provider / Hospital Call Type Provider Call [facility] Page Now Reason for Call Request to speak to Physician Initial Comment Caller states she has a patient and is needing to speak with the on call provider as it's an urgent matter. Additional Comment Patient has missed a few dialysis treatment and needs emergency hospice treatment to get him admitted. Patient wants to be treated at home and he is rapidly declining. Patient Name David [name] Patient DOB 02-01-1941 Requesting Provider Natalia Leatherwood Physician Number [phone] Facility Name N/A Room Number N/A Disp. Time Disposition Final User 06/19/2021 11:29:16 AM Send to [facility] Paging Queue Veneta Penton 06/19/2021 11:36:39 AM Called On-Call Provider Lynden Oxford 06/19/2021 11:36:51 AM Page Completed Yes Lynden Oxford Paging DoctorName Phone DateTime Result/Outcome Message Type Notes Lamar Blinks MD [phone] 06/19/2021 11:36:39 AM Called On Call Provider - Reached Doctor Paged Lamar Blinks- MD 06/19/2021 11:36:46 AM Spoke with On Call - General Message Result Call Closed By: Lynden Oxford Transaction Date/Time: 06/19/2021 11:23:19 AM (ET)

## 2021-06-21 NOTE — Telephone Encounter (Signed)
Please advise 

## 2021-06-21 NOTE — Telephone Encounter (Signed)
See note

## 2021-06-22 DIAGNOSIS — E1122 Type 2 diabetes mellitus with diabetic chronic kidney disease: Secondary | ICD-10-CM | POA: Diagnosis not present

## 2021-06-22 DIAGNOSIS — E11319 Type 2 diabetes mellitus with unspecified diabetic retinopathy without macular edema: Secondary | ICD-10-CM | POA: Diagnosis not present

## 2021-06-22 DIAGNOSIS — E1142 Type 2 diabetes mellitus with diabetic polyneuropathy: Secondary | ICD-10-CM | POA: Diagnosis not present

## 2021-06-22 DIAGNOSIS — I509 Heart failure, unspecified: Secondary | ICD-10-CM | POA: Diagnosis not present

## 2021-06-22 DIAGNOSIS — N186 End stage renal disease: Secondary | ICD-10-CM | POA: Diagnosis not present

## 2021-06-22 DIAGNOSIS — I132 Hypertensive heart and chronic kidney disease with heart failure and with stage 5 chronic kidney disease, or end stage renal disease: Secondary | ICD-10-CM | POA: Diagnosis not present

## 2021-06-22 NOTE — Telephone Encounter (Signed)
No new patient office visit made patient has been placed on hospice per his wife.

## 2021-06-22 NOTE — Progress Notes (Unsigned)
Chronic Care Management Pharmacy Note  06/19/2021 Name:  David [name] MRN:  [id number] DOB:  12-11-1940  Summary: Initial PharmD visit.  Patient has adjusted BP meds due to dialysis.  BP remains stable.  Reports high copays on insulin but makes more than PAP programs will allow for assistance.  Taking half tablet of tramadol for knee pain pending Xray - did make him drosy but will now just dose at night.  Recommendations/Changes made from today's visit: Research other alternatives for help with insulin  Plan: FU 6 months   Subjective: David [name] is an 81 y.o. year old male who is a primary patient of [name], MD.  The CCM team was consulted for assistance with disease management and care coordination needs.    Engaged with patient by telephone for initial visit in response to provider referral for pharmacy case management and/or care coordination services.   Consent to Services:  The patient was given the following information about Chronic Care Management services today, agreed to services, and gave verbal consent: 1. CCM service includes personalized support from designated clinical staff supervised by the primary care provider, including individualized plan of care and coordination with other care providers 2. 24/7 contact phone numbers for assistance for urgent and routine care needs. 3. Service will only be billed when office clinical staff spend 20 minutes or more in a month to coordinate care. 4. Only one practitioner may furnish and bill the service in a calendar month. 5.The patient may stop CCM services at any time (effective at the end of the month) by phone call to the office staff. 6. The patient will be responsible for cost sharing (co-pay) of up to 20% of the service fee (after annual deductible is met). Patient agreed to services and consent obtained.  Patient Care Team: [name], MD as PCP - General (Family Medicine) Sueanne Margarita, MD as PCP -  Sleep Medicine (Cardiology) [name], MD as Consulting Physician (Nephrology) Elayne Snare, MD as Consulting Physician (Endocrinology) [name], MD as Consulting Physician (Cardiology) Marzetta Board, DPM as Consulting Physician (Podiatry) [place], Heather, [place] as Consulting Physician (Optometry) Frann Rider, NP as Nurse Practitioner (Neurology) Madelin Rear, [facility] as Pharmacist (Pharmacist) Edythe Clarity, [facility] as Pharmacist (Pharmacist)  Recent office visits:  11/05/2020 Murlean Caller, Lynett Fish, FNP; acute visit for hand pain, occupational therapy, no medication changes indicated.   Recent consult visits:  11/12/2020 OV (endocrinology) Elayne Snare, MD; go back up to 1.8 mg of Victoza, Reduce Tresiba to 36 units   10/13/2020 OV (cardiology) Virl Axe, MD; no medication changes indicated.   09/30/2020 OV (podiatry) Marzetta Board, DPM; no medication changes indicated.   09/08/2020 OV (Vasc Surg) Jamelle Haring, MD; CKD nearing ESRD follow up, no medication changes indicated.   09/07/2020 OV (endocrinology) Elayne Snare, MD; novolog 8 units am and 6-8 at supper, Tyler Aas adjust based on am sugars, Victoza 1.2 mg a day.   Hospital visits:  Medication Reconciliation was completed by comparing discharge summary, patients EMR and Pharmacy list, and upon discussion with patient.   Admitted to the hospital on 08/06/2020 for procedure (left upper extremity arteriovenous fistula branch ligation x 2) due to CKD. Discharge date was 08/06/2020. Discharged from [facility]?Medications Started at [facility] Discharge:?? -started Tramadol 50 mg due to pain.   Medications that remain the same after Hospital Discharge:?? -All other medications will remain the same.  Objective:  Lab Results  Component Value Date   CREATININE 5.76 (H) 06/05/2021   BUN 38 (H) 06/05/2021   GFR 21.25 (L) 03/30/2020   GFRNONAA 9 (L) 06/05/2021   GFRAA 22 (L)  01/31/2020   NA 137 06/05/2021   K 4.4 06/05/2021   CALCIUM 9.1 06/05/2021   CO2 30 06/05/2021   GLUCOSE 150 (H) 06/05/2021    Lab Results  Component Value Date/Time   HGBA1C 6.7 (H) 05/21/2021 02:42 PM   HGBA1C 6.7 (H) 11/10/2020 01:48 PM   HGBA1C 11.5 (H) 10/26/2014 04:36 AM   FRUCTOSAMINE 341 (H) 05/21/2021 02:42 PM   FRUCTOSAMINE 329 (H) 05/02/2019 02:05 PM   GFR 21.25 (L) 03/30/2020 01:52 PM   GFR 25.02 (L) 12/25/2019 01:49 PM   MICROALBUR 7.3 (H) 03/05/2019 03:26 PM   MICROALBUR 11.9 (H) 10/24/2017 10:50 AM    Last diabetic Eye exam:  Lab Results  Component Value Date/Time   HMDIABEYEEXA No Retinopathy 10/21/2016 12:00 AM   HMDIABEYEEXA Retinopathy (A) 10/21/2016 12:00 AM    Last diabetic Foot exam: No results found for: HMDIABFOOTEX   Lab Results  Component Value Date   CHOL 86 11/10/2020   HDL 21.00 (L) 11/10/2020   LDLCALC 38 11/10/2020   LDLDIRECT 45.0 12/25/2019   TRIG 137.0 11/10/2020   CHOLHDL 4 11/10/2020    Hepatic Function Latest Ref Rng & Units 12/03/2020 11/10/2020 11/05/2020  Total Protein 6.5 - 8.1 g/dL - - -  Albumin 3.5 - 5.0 g/dL 3.2(L) - 3.2(L)  AST 15 - 41 U/L - - -  ALT 0 - 53 U/L - 16 -  Alk Phosphatase 38 - 126 U/L - - -  Total Bilirubin 0.3 - 1.2 mg/dL - - -  Bilirubin, Direct 0.0 - 0.3 mg/dL - - -    Lab Results  Component Value Date/Time   TSH 2.73 11/12/2020 01:30 PM   TSH 2.410 03/06/2019 02:53 PM    CBC Latest Ref Rng & Units 06/05/2021 04/05/2021 02/24/2021  WBC 4.0 - 10.5 K/uL 8.7 9.0 -  Hemoglobin 13.0 - 17.0 g/dL 10.8(L) 11.5(L) 12.9(L)  Hematocrit 39.0 - 52.0 % 33.5(L) 37.2(L) 38.0(L)  Platelets 150 - 400 K/uL 199 202 -    Lab Results  Component Value Date/Time   VD25OH 63.9 05/24/2018 12:00 AM   VD25OH 21 (L) 01/27/2017 11:56 AM    Clinical ASCVD: Yes  The ASCVD Risk score (Arnett DK, et al., 2019) failed to calculate for the following reasons:   The 2019 ASCVD risk score is only valid for ages 58 to 91   The  patient has a prior MI or stroke diagnosis    Depression screen [facility] 2/9 11/05/2020 07/23/2020 07/11/2019  Decreased Interest 0 0 0  Down, Depressed, Hopeless 0 0 0  PHQ - 2 Score 0 0 0  Some recent data might be hidden     Social History   Tobacco Use  Smoking Status Former   Packs/day: 2.00   Years: 30.00   Pack years: 60.00   Types: Cigarettes   Quit date: 03/03/1984   Years since quitting: 37.3  Smokeless Tobacco Never  Tobacco Comments   Former smoker 03/15/2021   BP Readings from Last 3 Encounters:  06/11/21 120/60  06/08/21 138/60  06/05/21 (!) 159/69   Pulse Readings from Last 3 Encounters:  06/11/21 (!) 112  06/05/21 (!) 30  05/31/21 (!) 105   Wt Readings from Last 3 Encounters:  06/11/21 251 lb (113.9 kg)  06/08/21 251 lb (  113.9 kg)  05/31/21 253 lb (114.8 kg)   BMI Readings from Last 3 Encounters:  06/11/21 30.55 kg/m  06/08/21 30.55 kg/m  05/31/21 30.80 kg/m    Assessment/Interventions: Review of patient past medical history, allergies, medications, health status, including review of consultants reports, laboratory and other test data, was performed as part of comprehensive evaluation and provision of chronic care management services.   SDOH:  (Social Determinants of Health) assessments and interventions performed: Yes  Financial Resource Strain: Low Risk    Difficulty of Paying Living Expenses: Not hard at all    SDOH Screenings   Alcohol Screen: Not on file  Depression (PHQ2-9): Low Risk    PHQ-2 Score: 0  Financial Resource Strain: Low Risk    Difficulty of Paying Living Expenses: Not hard at all  Food Insecurity: No Food Insecurity   Worried About Charity fundraiser in the Last Year: Never true   Arboriculturist in the Last Year: Never true  Housing: Low Risk    Last Housing Risk Score: 0  Physical Activity: Insufficiently Active   Days of Exercise per Week: 3 days   Minutes of Exercise per Session: 20 min  Social Connections:  Socially Isolated   Frequency of Communication with Friends and Family: Never   Frequency of Social Gatherings with Friends and Family: Once a week   Attends Religious Services: Never   Marine scientist or Organizations: No   Attends Music therapist: Never   Marital Status: Married  Stress: No Stress Concern Present   Feeling of Stress : Not at all  Tobacco Use: Medium Risk   Smoking Tobacco Use: Former   Smokeless Tobacco Use: Never   Passive Exposure: Not on Pensions consultant Needs: No Transportation Needs   Lack of Transportation (Medical): No   Lack of Transportation (Non-Medical): No    CCM Care Plan  Allergies  Allergen Reactions   Ativan [Lorazepam] Anxiety and Other (See Comments)    Pt gets more agitated   Adhesive [Tape] Other (See Comments)    blisters   Niacin Rash    Medications Reviewed Today     Reviewed by Marijo Conception, FNP (Family Nurse Practitioner) on 06/17/21 at 2221  Med List Status: <None>   Medication Order Taking? Sig Documenting Provider Last Dose Status Informant  acetaminophen (TYLENOL) 500 MG tablet [id number] No Take 1,000 mg by mouth 2 (two) times daily as needed (pain.). [provider] Taking Active Spouse/Significant Other  apixaban (ELIQUIS) 2.5 MG TABS tablet [id number] No Take 1 tablet (2.5 mg total) by mouth 2 (two) times daily. Sherran Needs, NP Taking Active   atorvastatin (LIPITOR) 10 MG tablet [id number] No Take 1 tablet (10 mg total) by mouth every evening. [name], MD Taking Active   blood glucose meter kit and supplies KIT [id number] No Dispense based on patient and insurance preference. Use up to four times daily as directed. (FOR ICD-9 250.00, 250.01). Elodia Florence., MD Taking Active Spouse/Significant Other  Blood Glucose Monitoring Suppl (ACCU-CHEK GUIDE) w/Device Drucie Opitz [id number]  Use to check blood sugar daily Elayne Snare, MD  Active   calcitRIOL (ROCALTROL) 0.25 MCG capsule  [id number] No Take 1 capsule (0.25 mcg total) by mouth daily. [name], MD Taking Active Spouse/Significant Other  carvedilol (COREG) 6.25 MG tablet [id number] No Take 3.125 mg by mouth 2 (two) times daily. [provider] Taking Active  Med Note [name], KRISTEN A   Tue Jun 08, 2021  8:09 AM) 3.25 mg BID changed by dialysis  cetirizine (ZYRTEC) 10 MG tablet [id number] No Take by mouth. [provider] Taking Active   Darbepoetin Alfa (ARANESP, ALBUMIN FREE,) 60 MCG/ML SOLN [id number] No Inject 60 mcg as directed every 30 (thirty) days. [provider] Taking Active Spouse/Significant Other  Epoetin Alfa (EPOGEN IJ) [id number] No Inject as directed See admin instructions. Every 4 weeks [provider] Taking Active Spouse/Significant Other           Med Note Lia Hopping, MINDY L   Tue Oct 13, 3380  5:05 PM)    folic acid-vitamin b complex-vitamin c-selenium-zinc (DIALYVITE) 3 MG TABS tablet [id number] No Take 1 tablet by mouth every evening. [provider] Taking Active Spouse/Significant Other  gabapentin (NEURONTIN) 300 MG capsule [id number] No Take 1 capsule (300 mg total) by mouth 3 (three) times a week. After dialysis [name], MD Taking Active   glucose blood test strip [id number] No Use Contour test strips as instructed to check blood sugar four times daily. Elayne Snare, MD Taking Active Spouse/Significant Other  insulin aspart (NOVOLOG FLEXPEN) 100 UNIT/ML FlexPen [id number] No Inject 10-12 Units into the skin See admin instructions. Inject 10 units subcutaneously before breakfast and 12 units before supper  Patient taking differently: Inject 8 Units into the skin in the morning and at bedtime.   Elayne Snare, MD Taking Active Spouse/Significant Other  Insulin Syringe-Needle U-100 (INSULIN SYRINGE 1CC/31GX5/16") 31G X 5/16" 1 ML MISC [id number] No 1 each by Does not apply route 3 (three) times daily. Use insulin syringe to inject insulin  three times daily. Elayne Snare, MD Taking Active Spouse/Significant Other  iron sucrose in sodium chloride 0.9 % 100 mL [id number] No Iron Sucrose (Venofer) [provider] Taking Active   midodrine (PROAMATINE) 2.5 MG tablet [id number]  Take 0.5 tablets (1.25 mg total) by mouth 3 (three) times a week. Before dialysis [name], MD  Active   Omega-3 Fatty Acids (FISH OIL) 1000 MG CAPS [id number] No Take 1 capsule (1,000 mg total) by mouth every morning. Garvin Fila, MD Taking Active Spouse/Significant Other  pantoprazole (PROTONIX) 40 MG tablet [id number] No Take 1 tablet (40 mg total) by mouth daily. [name], MD Taking Active   sertraline (ZOLOFT) 100 MG tablet [id number] No Take 1 tablet (100 mg total) by mouth daily. [name], MD Taking Active Spouse/Significant Other  sevelamer carbonate (RENVELA) 800 MG tablet [id number] No Take 800 mg by mouth 3 (three) times daily with meals. [provider] Taking Active Spouse/Significant Other  TRESIBA FLEXTOUCH 200 UNIT/ML FlexTouch Pen [id number] No INJECT 44 UNITS SUBCUTANEOUSLY (UNDER THE SKIN) ONCE A DAY  Patient taking differently: No sig reported   Elayne Snare, MD Taking Active   VICTOZA 18 MG/3ML SOPN [id number] No INJECT 1.8MG UNDER THE SKIN DAILY BEFORE SUPPER Elayne Snare, MD Taking Active             Patient Active Problem List   Diagnosis Date Noted   Palliative care encounter 06/14/2021   Adjustment disorder with mixed anxiety and depressed mood 06/14/2021   At risk for falling 06/14/2021   Insomnia 05/31/2021   Neuropathic pain 05/31/2021   Intra-dialytic hypotension 05/31/2021   Hypertrophy of prostate without urinary obstruction and other lower urinary tract symptoms (LUTS) 05/19/2021   (HFpEF) heart failure with preserved ejection fraction ([place]) 02/25/2021   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  Secondary hyperparathyroidism of renal origin ([place]) 01/19/2021   Encounter for  screening for respiratory tuberculosis 01/15/2021   Allergy, unspecified, initial encounter 02/58/5277   Complication of vascular dialysis catheter 12/24/2020   Iron deficiency anemia, unspecified 12/18/2020   Anemia in chronic kidney disease 12/08/2020   Gout 05/18/2020   Leukocytosis 04/20/2020   History of CVA (cerebrovascular accident) 04/20/2020   Debility 03/20/2020   Osteoarthritis 03/13/2020   Diarrhea 02/18/2019   GERD (gastroesophageal reflux disease) 01/24/2019   Memory change 11/19/2018   Senile purpura ([place]) 11/27/2017   ESRD on dialysis ([place]) 09/26/2017   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 ([place]) 09/04/2017   Nonobstructive CAD s/p PCI 2012    Cardiac pacemaker in situ    PVD (peripheral vascular disease) ([place])    Anxiety 03/22/2017   Acute cholecystitis s/p lap cholecystectomy 03/05/2017 03/04/2017   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[place]) 03/04/2017   T2DM (type 2 diabetes mellitus) ([place]) 03/04/2017   Fatigue 01/27/2017   Low vitamin B12 level 01/27/2017   OSA on CPAP 10/26/2014   Seasonal and perennial allergic rhinitis 10/25/2013   Hypertension associated with diabetes ([place]) 10/25/2013   Hyperlipidemia associated with type 2 diabetes mellitus ([place]) 10/25/2013    Immunization History  Administered Date(s) Administered   Fluad Quad(high Dose 65+) 03/05/2019   Influenza, High Dose Seasonal PF 02/09/2017   Influenza,inj,Quad PF,6+ Mos 01/30/2014   Influenza-Unspecified 03/17/2015, 04/15/2016, 01/14/2018, 02/11/2020   PFIZER(Purple Top)SARS-COV-2 Vaccination 06/21/2019, 07/16/2019, 02/22/2020    Conditions to be addressed/monitored:  HTN, CAD, GERD, HLD, Diabetes, Anxiety  There are no care plans that you recently modified to display for this patient.     Medication Assistance: None required.  Patient affirms current coverage meets needs.  Compliance/Adherence/Medication fill history: Care Gaps: Zoster  Vaccine  Star-Rating Drugs: Atorvastatin 10 mg once a day, last filled 06/09/2020 90 DS Victoza 18 mg/64m last filled 07/01/2020 90 DS Tresiba 200u/mL last filled 07/12/2020 41 DS Novolog Flexpen 100u/mL last filled 07/30/2020 30 DS  Patient's preferred pharmacy is:  [facility] [place] [facility] [address]Phone: [phone]: [phone] [id number] [place], [place]one: [phone]: [phone] Uses pill box? Yes - wife organizes a week at a time Pt endorses 100% compliance  We discussed: Benefits of medication synchronization, packaging and delivery as well as enhanced pharmacist oversight with [employer]. Patient decided to: Continue current medication management strategy  Care Plan and Follow Up Patient Decision:  Patient agrees to Care Plan and Follow-up.  Plan: The care management team will reach out to the patient again over the next 180 days.  ChBeverly MilchPharmD Clinical Pharmacist  [phone] Current Barriers:  Patient going through dialysis treatments  Pharmacist Clinical Goal(s):  Patient will maintain control of BP as evidenced by home monitoring  contact provider office for questions/concerns as evidenced notation of same in electronic health record through collaboration with PharmD and provider.   Interventions: 1:1 collaboration with PaVivi BarrackMD regarding development and update of comprehensive plan of care as evidenced by provider attestation and co-signature Inter-disciplinary care team collaboration (see longitudinal plan of care) Comprehensive medication review performed; medication list updated in electronic medical record  Hypertension  (Status:Goal on track: YES.)   Med Management Intervention: Meds reviewed  (BP goal  <130/80) -Controlled -Current  treatment: Amlodipine 66m daily Hydralazine 1015m- M, Tu, Th, Fri -only taking once a day and BID all other days Imdur 3010m4hr daily Carvedilol 25m18mM, Tu, Th, Fri -only taking once a day and BID all other days -Medications previously tried: none noted  -Current home readings: "normal" - has it checked a few times per week at dialysis -Current dietary habits: appetite is decreased, see DM -Current exercise habits: minimal to none -Reports hypotensive/hypertensive symptoms - one episode of dizziness recently at dailysis which BP was low -Educated on BP goals and benefits of medications for prevention of heart attack, stroke and kidney damage; Daily salt intake goal < 2300 mg; Importance of home blood pressure monitoring; -Counseled to monitor BP at home if symptomatic, document, and provide log at future appointments -Recommended to continue current medication  Hyperlipidemia: (LDL goal < 100) -Controlled -Current treatment: Atorvastatin 10mg84mly -Most recent LDL excellent -Educated on Cholesterol goals;  Benefits of statin for ASCVD risk reduction; Importance of limiting foods high in cholesterol; -Recommended to continue current medication  Diabetes (A1c goal <7%) -Controlled -Current medications: Tresiba 44 units once daily - evening (38 units last night) Victoza 18mg/56mdaily Novolog 10-12 units before breakfast and 12 at supper -Medications previously tried: Humalog 75/25 mix  -Current home glucose readings fasting glucose: 72 this am, most have been up and down, does report a few lower redings just not sure of exact numbers post prandial glucose: < 200 mostly -Denies hypoglycemic/hyperglycemic symptoms -Current meal patterns:  breakfast: eggs, sausage, pancakes  lunch: glucerna, pb and J sandwich  dinner: home cooked meal with meat, vegetable, starch snacks: banana, crackers, cashews drinks: water, rarely regular soda -Current  exercise: minimal -Educated on A1c and blood sugar goals; Prevention and management of hypoglycemic episodes; Benefits of routine self-monitoring of blood sugar; When to call if sugars are low -Counseled to check feet daily and get yearly eye exams -Wife is adjusting insulin doses at night and mealtime based on home monitoring at direction of endocrine - she has a good understanding of how to do this. -Recommended to continue current medication Recommended she contact providers if he continues to have low sugars in the morning.  Anxiety (Goal: Minimize symptoms) -Controlled -Current treatment: Sertraline 100mg d38m -Medications previously tried/failed: Lorazepam, hydroxyzine -PHQ9:  PHQ9 SCORE ONLY 11/05/2020 07/23/2020 07/11/2019  PHQ-9 Total Score 0 0 0   -GAD7: No flowsheet data found. -Educated on Benefits of medication for symptom control -Recommended to continue current medication -Wife reports mood is stable  Osteoarthritis(Goal: Minimize symptoms) -Controlled -Current treatment  Tramadol 50mg - 25mently using a half tablet at night to manage pain -Medications previously tried: none noted -Wife gave him a half tablet yesterday and it made him feel a little drowsy.  She will not move to just night time dosing to avoid this.  X ray of knee has been ordered to rule out fracture. -Recommended to continue current medication  Patient Goals/Self-Care Activities Patient will:  - take medications as prescribed focus on medication adherence by pill box check glucose daily, document, and provide at future appointments  Follow Up Plan: The care management team will reach out to the patient again over the next 180 days.

## 2021-06-22 NOTE — Telephone Encounter (Signed)
Dr Lorelei Pont gave orders over the weekend. Can we check to see if there is anything else they need?  [name], MD 06/24/2021 4:23 PM

## 2021-06-22 NOTE — Telephone Encounter (Signed)
Patient has already had hospice orders placed.  David [name]. [name], MD 06/28/2021 4:26 PM

## 2021-06-23 ENCOUNTER — Telehealth: Payer: Self-pay

## 2021-06-23 NOTE — Telephone Encounter (Signed)
See note

## 2021-06-23 NOTE — Telephone Encounter (Addendum)
Has called to say that patient passed last night.  Not sure of the time or if it was on 2/7 or 2/8.    I have cancelled appts for our office.    States we should receive official information.  Will not mark chart until we know date and time.

## 2021-06-24 NOTE — Telephone Encounter (Signed)
Noted. I completed the death certificate this afternoon.  [name], MD 06/24/2021 1:00 PM

## 2021-07-05 ENCOUNTER — Telehealth: Payer: Medicare Other

## 2021-07-14 DEATH — deceased

## 2021-07-29 ENCOUNTER — Ambulatory Visit: Payer: Medicare Other

## 2021-08-18 ENCOUNTER — Ambulatory Visit: Payer: Medicare Other | Admitting: Podiatry
